# Patient Record
Sex: Male | Born: 1960 | Race: White | Hispanic: No | Marital: Single | Smoking: Former smoker
Health system: Southern US, Community
[De-identification: ages and names within clinical notes are randomized; demographics above are authoritative.]

## PROBLEM LIST (undated history)

## (undated) DIAGNOSIS — E669 Obesity, unspecified: Secondary | ICD-10-CM

## (undated) DIAGNOSIS — F410 Panic disorder [episodic paroxysmal anxiety] without agoraphobia: Secondary | ICD-10-CM

## (undated) DIAGNOSIS — G6181 Chronic inflammatory demyelinating polyneuritis: Secondary | ICD-10-CM

## (undated) DIAGNOSIS — E7800 Pure hypercholesterolemia, unspecified: Secondary | ICD-10-CM

## (undated) DIAGNOSIS — F1121 Opioid dependence, in remission: Secondary | ICD-10-CM

## (undated) DIAGNOSIS — M1990 Unspecified osteoarthritis, unspecified site: Secondary | ICD-10-CM

## (undated) DIAGNOSIS — Z9989 Dependence on other enabling machines and devices: Secondary | ICD-10-CM

## (undated) DIAGNOSIS — K589 Irritable bowel syndrome without diarrhea: Secondary | ICD-10-CM

## (undated) DIAGNOSIS — I2510 Atherosclerotic heart disease of native coronary artery without angina pectoris: Secondary | ICD-10-CM

## (undated) DIAGNOSIS — F32A Depression, unspecified: Secondary | ICD-10-CM

## (undated) DIAGNOSIS — E349 Endocrine disorder, unspecified: Secondary | ICD-10-CM

## (undated) DIAGNOSIS — G629 Polyneuropathy, unspecified: Secondary | ICD-10-CM

## (undated) DIAGNOSIS — G4733 Obstructive sleep apnea (adult) (pediatric): Secondary | ICD-10-CM

## (undated) DIAGNOSIS — I10 Essential (primary) hypertension: Secondary | ICD-10-CM

## (undated) DIAGNOSIS — F1520 Other stimulant dependence, uncomplicated: Secondary | ICD-10-CM

## (undated) DIAGNOSIS — F329 Major depressive disorder, single episode, unspecified: Secondary | ICD-10-CM

## (undated) DIAGNOSIS — R42 Dizziness and giddiness: Secondary | ICD-10-CM

## (undated) HISTORY — PX: CARDIAC CATHETERIZATION: SHX172

---

## 2012-02-08 ENCOUNTER — Encounter (HOSPITAL_BASED_OUTPATIENT_CLINIC_OR_DEPARTMENT_OTHER): Payer: Self-pay | Admitting: *Deleted

## 2012-02-08 ENCOUNTER — Emergency Department (HOSPITAL_BASED_OUTPATIENT_CLINIC_OR_DEPARTMENT_OTHER)
Admission: EM | Admit: 2012-02-08 | Discharge: 2012-02-09 | Disposition: A | Discharge status: Home / Self Care | Payer: Self-pay | Attending: Emergency Medicine | Admitting: Emergency Medicine

## 2012-02-08 DIAGNOSIS — K648 Other hemorrhoids: Secondary | ICD-10-CM | POA: Insufficient documentation

## 2012-02-08 DIAGNOSIS — I10 Essential (primary) hypertension: Secondary | ICD-10-CM | POA: Insufficient documentation

## 2012-02-08 DIAGNOSIS — K644 Residual hemorrhoidal skin tags: Secondary | ICD-10-CM | POA: Insufficient documentation

## 2012-02-08 DIAGNOSIS — E079 Disorder of thyroid, unspecified: Secondary | ICD-10-CM | POA: Insufficient documentation

## 2012-02-08 DIAGNOSIS — E119 Type 2 diabetes mellitus without complications: Secondary | ICD-10-CM | POA: Insufficient documentation

## 2012-02-08 DIAGNOSIS — K649 Unspecified hemorrhoids: Secondary | ICD-10-CM

## 2012-02-08 HISTORY — DX: Essential (primary) hypertension: I10

## 2012-02-08 HISTORY — DX: Opioid dependence, in remission: F11.21

## 2012-02-08 HISTORY — DX: Irritable bowel syndrome, unspecified: K58.9

## 2012-02-08 NOTE — ED Notes (Signed)
Hx of hemorrhoids and fissures. States he just moved here. Rectal bleeding off and on x 8 weeks. Was seen 5 x's in the ED in [place] for same and sent to surgeon who put him on a high fiber diet. He recently moved and was unable to keep in touch with his surgeon. Tonight he felt constipated and then had explosive diarrhea with blood in it.

## 2012-02-09 LAB — BASIC METABOLIC PANEL
BUN: 17 mg/dL (ref 6–23)
CO2: 24 mEq/L (ref 19–32)
Calcium: 9.8 mg/dL (ref 8.4–10.5)
Chloride: 101 mEq/L (ref 96–112)
Creatinine, Ser: 0.8 mg/dL (ref 0.50–1.35)
GFR calc Af Amer: >90 mL/min (ref >90)
GFR calc non Af Amer: >90 mL/min (ref >90)
Glucose, Bld: 177 mg/dL — ABNORMAL HIGH (ref 70–99)
Potassium: 3.9 mEq/L (ref 3.5–5.1)
Sodium: 136 mEq/L (ref 135–145)

## 2012-02-09 LAB — CBC
HCT: 44.6 % (ref 39.0–52.0)
Hemoglobin: 15.9 g/dL (ref 13.0–17.0)
MCH: 29.9 pg (ref 26.0–34.0)
MCHC: 35.7 g/dL (ref 30.0–36.0)
MCV: 83.8 fL (ref 78.0–100.0)
Platelets: 267 10*3/uL (ref 150–400)
RBC: 5.32 MIL/uL (ref 4.22–5.81)
RDW: 12.7 % (ref 11.5–15.5)
WBC: 8.8 10*3/uL (ref 4.0–10.5)

## 2012-02-09 LAB — DIFFERENTIAL
Basophils Absolute: 0 10*3/uL (ref 0.0–0.1)
Basophils Relative: 0 % (ref 0–1)
Eosinophils Absolute: 0.2 10*3/uL (ref 0.0–0.7)
Eosinophils Relative: 2 % (ref 0–5)
Lymphocytes Relative: 25 % (ref 12–46)
Lymphs Abs: 2.2 10*3/uL (ref 0.7–4.0)
Monocytes Absolute: 0.8 10*3/uL (ref 0.1–1.0)
Monocytes Relative: 9 % (ref 3–12)
Neutro Abs: 5.6 10*3/uL (ref 1.7–7.7)
Neutrophils Relative %: 64 % (ref 43–77)

## 2012-02-09 LAB — OCCULT BLOOD X 1 CARD TO LAB, STOOL: Fecal Occult Bld: NEGATIVE

## 2012-02-09 MED ORDER — HYDROCORTISONE ACETATE 25 MG RE SUPP: 25.0000 mg | Freq: Two times a day (BID) | RECTAL | Status: AC

## 2012-02-09 NOTE — ED Provider Notes (Signed)
History     CSN: [id number]  Arrival date & time 02/08/12  2145   First MD Initiated Contact with Patient 02/08/12 2341      Chief Complaint  Patient presents with  . Rectal Bleeding    (Consider location/radiation/quality/duration/timing/severity/associated sxs/prior treatment) Patient is a 51 y.o. male presenting with hematochezia. The history is provided by the patient.  Rectal Bleeding  The current episode started more than 2 weeks ago. The onset was gradual. The problem occurs frequently. The problem has been unchanged. Pain severity now: rectal pain no abdominal pain. The stool is described as hard. Prior successful therapies include fiber. There was no prior unsuccessful therapy. Associated symptoms include rectal pain. Pertinent negatives include no anorexia, no fever, no abdominal pain, no nausea, no vomiting and no chest pain. He has been behaving normally. He has been eating and drinking normally. His past medical history does not include recent abdominal injury. There were no sick contacts. Recently, medical care has been given at another facility. Services received include tests performed.  Has been seen in [place] at least 5 times for same and diagnosed with hemorrhoids also saw a surgeon but is not using the proctosol due to price.  No f/c/r.    Past Medical History  Diagnosis Date  . Hypertension   . Diabetes mellitus   . Thyroid disease   . IBS (irritable bowel syndrome)   . History of narcotic addiction     History reviewed. No pertinent past surgical history.  No family history on file.  History  Substance Use Topics  . Smoking status: Never Smoker   . Smokeless tobacco: Not on file  . Alcohol Use: No      Review of Systems  Constitutional: Negative for fever.  Respiratory: Negative for chest tightness and shortness of breath.   Cardiovascular: Negative for chest pain and leg swelling.  Gastrointestinal: Positive for constipation, hematochezia,  anal bleeding and rectal pain. Negative for nausea, vomiting, abdominal pain and anorexia.  All other systems reviewed and are negative.    Allergies  Review of patient's allergies indicates not on file.  Home Medications  No current outpatient prescriptions on file.  BP 138/93  Pulse 96  Temp 98 F (36.7 C) (Oral)  Resp 20  Ht 5\' 11"  (1.803 m)  Wt 320 lb (145.151 kg)  BMI 44.63 kg/m2  SpO2 100%  Physical Exam  Constitutional: He is oriented to person, place, and time. He appears well-developed and well-nourished. No distress.  HENT:  Head: Normocephalic and atraumatic.  Mouth/Throat: Oropharynx is clear and moist.  Eyes: Conjunctivae are normal. Pupils are equal, round, and reactive to light.  Neck: Normal range of motion. Neck supple.  Cardiovascular: Normal rate and regular rhythm.   Pulmonary/Chest: Effort normal and breath sounds normal. No respiratory distress. He has no wheezes. He has no rales.  Abdominal: Soft. Bowel sounds are normal. He exhibits no distension and no mass. There is no tenderness. There is no rebound and no guarding.  Genitourinary: Guaiac negative stool.       Internal and external hemorrhoid not thrombosed   Musculoskeletal: Normal range of motion.  Lymphadenopathy:    He has no cervical adenopathy.  Neurological: He is alert and oriented to person, place, and time. He has normal reflexes.  Skin: Skin is warm and dry.  Psychiatric: He has a normal mood and affect.    ED Course  Procedures (including critical care time)  Labs Reviewed  BASIC METABOLIC PANEL -  Abnormal; Notable for the following:    Glucose, Bld 177 (*)     All other components within normal limits  CBC  DIFFERENTIAL  OCCULT BLOOD X 1 CARD TO LAB, STOOL   No results found.   No diagnosis found.    MDM  Hemorrhoids.  No abdominal pain.  Exam and labs reassuring.  Was imaged in [facility].  No indication for repeat imaging at this time.  Use proctosol, follow up with GI  return for bleeding.  Patient verbalizes understanding and agrees to follow up        Raeleen Winstanley Smitty Cords, MD 02/09/12 0500

## 2012-02-09 NOTE — ED Notes (Signed)
I took blood samples and gave to lab.

## 2012-02-09 NOTE — Discharge Instructions (Signed)
Hemorrhoids Hemorrhoids are enlarged (dilated) veins around the rectum. There are 2 types of hemorrhoids, and the type of hemorrhoid is determined by its location. Internal hemorrhoids occur in the veins just inside the rectum.They are usually not painful, but they may bleed.However, they may poke through to the outside and become irritated and painful. External hemorrhoids involve the veins outside the anus and can be felt as a painful swelling or hard lump near the anus.They are often itchy and may crack and bleed. Sometimes clots will form in the veins. This makes them swollen and painful. These are called thrombosed hemorrhoids. CAUSES Causes of hemorrhoids include:  Pregnancy. This increases the pressure in the hemorrhoidal veins.   Constipation.   Straining to have a bowel movement.   Obesity.   Heavy lifting or other activity that caused you to strain.  TREATMENT Most of the time hemorrhoids improve in 1 to 2 weeks. However, if symptoms do not seem to be getting better or if you have a lot of rectal bleeding, your caregiver may perform a procedure to help make the hemorrhoids get smaller or remove them completely.Possible treatments include:  Rubber band ligation. A rubber band is placed at the base of the hemorrhoid to cut off the circulation.   Sclerotherapy. A chemical is injected to shrink the hemorrhoid.   Infrared light therapy. Tools are used to burn the hemorrhoid.   Hemorrhoidectomy. This is surgical removal of the hemorrhoid.  HOME CARE INSTRUCTIONS   Increase fiber in your diet. Ask your caregiver about using fiber supplements.   Drink enough water and fluids to keep your urine clear or pale yellow.   Exercise regularly.   Go to the bathroom when you have the urge to have a bowel movement. Do not wait.   Avoid straining to have bowel movements.   Keep the anal area dry and clean.   Only take over-the-counter or prescription medicines for pain, discomfort,  or fever as directed by your caregiver.  If your hemorrhoids are thrombosed:  Take warm sitz baths for 20 to 30 minutes, 3 to 4 times per day.   If the hemorrhoids are very tender and swollen, place ice packs on the area as tolerated. Using ice packs between sitz baths may be helpful. Fill a plastic bag with ice. Place a towel between the bag of ice and your skin.   Medicated creams and suppositories may be used or applied as directed.   Do not use a donut-shaped pillow or sit on the toilet for long periods. This increases blood pooling and pain.  SEEK MEDICAL CARE IF:   You have increasing pain and swelling that is not controlled with your medicine.   You have uncontrolled bleeding.   You have difficulty or you are unable to have a bowel movement.   You have pain or inflammation outside the area of the hemorrhoids.   You have chills or an oral temperature above 102 F (38.9 C).  MAKE SURE YOU: Fiber Content in Foods Drinking plenty of fluids and consuming foods high in fiber can help with constipation. See the list below for the fiber content of some common foods. Starches and Grains / Dietary Fiber (g)  Cheerios, 1 cup / 3 g   Kellogg's Corn Flakes, 1 cup / 0.7 g   Rice Krispies, 1  cup / 0.3 g   Quaker Oat Life Cereal,  cup / 2.1 g   Oatmeal, instant (cooked),  cup / 2 g   Kellogg's  Frosted Mini Wheats, 1 cup / 5.1 g   Rice, brown, long-grain (cooked), 1 cup / 3.5 g   Rice, white, long-grain (cooked), 1 cup / 0.6 g   Macaroni, cooked, enriched, 1 cup / 2.5 g  Legumes / Dietary Fiber (g)  Beans, baked, canned, plain or vegetarian,  cup / 5.2 g   Beans, kidney, canned,  cup / 6.8 g   Beans, pinto, dried (cooked),  cup / 7.7 g   Beans, pinto, canned,  cup / 5.5 g  Breads and Crackers / Dietary Fiber (g)  Graham crackers, plain or honey, 2 squares / 0.7 g   Saltine crackers, 3 squares / 0.3 g   Pretzels, plain, salted, 10 pieces / 1.8 g   Bread,  whole-wheat, 1 slice / 1.9 g   Bread, white, 1 slice / 0.7 g   Bread, raisin, 1 slice / 1.2 g   Bagel, plain, 3 oz / 2 g   Tortilla, flour, 1 oz / 0.9 g   Tortilla, corn, 1 small / 1.5 g   Bun, hamburger or hotdog, 1 small / 0.9 g  Fruits / Dietary Fiber (g)  Apple, raw with skin, 1 medium / 4.4 g   Applesauce, sweetened,  cup / 1.5 g   Banana,  medium / 1.5 g   Grapes, 10 grapes / 0.4 g   Orange, 1 small / 2.3 g   Raisin, 1.5 oz / 1.6 g   Melon, 1 cup / 1.4 g  Vegetables / Dietary Fiber (g)  Green beans, canned,  cup / 1.3 g   Carrots (cooked),  cup / 2.3 g   Broccoli (cooked),  cup / 2.8 g   Peas, frozen (cooked),  cup / 4.4 g   Potatoes, mashed,  cup / 1.6 g   Lettuce, 1 cup / 0.5 g   Corn, canned,  cup / 1.6 g   Tomato,  cup / 1.1 g  Document Released: 12/23/2006 Document Revised: 07/26/2011 Document Reviewed: 02/17/2007 [facility] Patient Information 2012 [place], [place].  Understand these instructions.   Will watch your condition.   Will get help right away if you are not doing well or get worse.  Document Released: 08/03/2000 Document Revised: 07/26/2011 Document Reviewed: 12/09/2007 [facility] Patient Information 2012 [place], [place].

## 2012-03-30 ENCOUNTER — Observation Stay (HOSPITAL_BASED_OUTPATIENT_CLINIC_OR_DEPARTMENT_OTHER)
Admission: EM | Admit: 2012-03-30 | Discharge: 2012-04-01 | Disposition: A | Discharge status: Home / Self Care | Payer: MEDICAID | Attending: Internal Medicine | Admitting: Internal Medicine

## 2012-03-30 ENCOUNTER — Emergency Department (HOSPITAL_BASED_OUTPATIENT_CLINIC_OR_DEPARTMENT_OTHER): Payer: Self-pay

## 2012-03-30 ENCOUNTER — Encounter (HOSPITAL_BASED_OUTPATIENT_CLINIC_OR_DEPARTMENT_OTHER): Payer: Self-pay | Admitting: Emergency Medicine

## 2012-03-30 DIAGNOSIS — E1142 Type 2 diabetes mellitus with diabetic polyneuropathy: Secondary | ICD-10-CM

## 2012-03-30 DIAGNOSIS — E1149 Type 2 diabetes mellitus with other diabetic neurological complication: Secondary | ICD-10-CM

## 2012-03-30 DIAGNOSIS — F3289 Other specified depressive episodes: Secondary | ICD-10-CM | POA: Insufficient documentation

## 2012-03-30 DIAGNOSIS — IMO0002 Reserved for concepts with insufficient information to code with codable children: Secondary | ICD-10-CM | POA: Diagnosis present

## 2012-03-30 DIAGNOSIS — I10 Essential (primary) hypertension: Secondary | ICD-10-CM

## 2012-03-30 DIAGNOSIS — E669 Obesity, unspecified: Secondary | ICD-10-CM | POA: Insufficient documentation

## 2012-03-30 DIAGNOSIS — Z8249 Family history of ischemic heart disease and other diseases of the circulatory system: Secondary | ICD-10-CM

## 2012-03-30 DIAGNOSIS — F419 Anxiety disorder, unspecified: Secondary | ICD-10-CM

## 2012-03-30 DIAGNOSIS — F411 Generalized anxiety disorder: Secondary | ICD-10-CM | POA: Insufficient documentation

## 2012-03-30 DIAGNOSIS — R079 Chest pain, unspecified: Principal | ICD-10-CM

## 2012-03-30 DIAGNOSIS — F1920 Other psychoactive substance dependence, uncomplicated: Secondary | ICD-10-CM | POA: Insufficient documentation

## 2012-03-30 DIAGNOSIS — R45851 Suicidal ideations: Secondary | ICD-10-CM | POA: Diagnosis present

## 2012-03-30 DIAGNOSIS — F32A Depression, unspecified: Secondary | ICD-10-CM | POA: Diagnosis present

## 2012-03-30 DIAGNOSIS — F329 Major depressive disorder, single episode, unspecified: Secondary | ICD-10-CM | POA: Insufficient documentation

## 2012-03-30 DIAGNOSIS — E7800 Pure hypercholesterolemia, unspecified: Secondary | ICD-10-CM | POA: Insufficient documentation

## 2012-03-30 DIAGNOSIS — R0602 Shortness of breath: Secondary | ICD-10-CM | POA: Insufficient documentation

## 2012-03-30 DIAGNOSIS — G4730 Sleep apnea, unspecified: Secondary | ICD-10-CM

## 2012-03-30 DIAGNOSIS — F418 Other specified anxiety disorders: Secondary | ICD-10-CM | POA: Diagnosis present

## 2012-03-30 DIAGNOSIS — E1140 Type 2 diabetes mellitus with diabetic neuropathy, unspecified: Secondary | ICD-10-CM | POA: Diagnosis present

## 2012-03-30 DIAGNOSIS — R072 Precordial pain: Secondary | ICD-10-CM | POA: Diagnosis present

## 2012-03-30 DIAGNOSIS — I200 Unstable angina: Secondary | ICD-10-CM | POA: Diagnosis present

## 2012-03-30 DIAGNOSIS — E785 Hyperlipidemia, unspecified: Secondary | ICD-10-CM

## 2012-03-30 DIAGNOSIS — E1165 Type 2 diabetes mellitus with hyperglycemia: Secondary | ICD-10-CM | POA: Diagnosis present

## 2012-03-30 DIAGNOSIS — I2510 Atherosclerotic heart disease of native coronary artery without angina pectoris: Secondary | ICD-10-CM

## 2012-03-30 DIAGNOSIS — R109 Unspecified abdominal pain: Secondary | ICD-10-CM | POA: Diagnosis present

## 2012-03-30 HISTORY — DX: Atherosclerotic heart disease of native coronary artery without angina pectoris: I25.10

## 2012-03-30 HISTORY — DX: Pure hypercholesterolemia, unspecified: E78.00

## 2012-03-30 HISTORY — DX: Obesity, unspecified: E66.9

## 2012-03-30 HISTORY — DX: Endocrine disorder, unspecified: E34.9

## 2012-03-30 HISTORY — DX: Depression, unspecified: F32.A

## 2012-03-30 HISTORY — DX: Major depressive disorder, single episode, unspecified: F32.9

## 2012-03-30 LAB — CBC
HCT: 41.4 % (ref 39.0–52.0)
HCT: 42.4 % (ref 39.0–52.0)
Hemoglobin: 14.6 g/dL (ref 13.0–17.0)
Hemoglobin: 14.7 g/dL (ref 13.0–17.0)
MCH: 30.2 pg (ref 26.0–34.0)
MCH: 30.1 pg (ref 26.0–34.0)
MCHC: 35.3 g/dL (ref 30.0–36.0)
MCHC: 34.7 g/dL (ref 30.0–36.0)
MCV: 85.7 fL (ref 78.0–100.0)
MCV: 86.9 fL (ref 78.0–100.0)
Platelets: 264 10*3/uL (ref 150–400)
Platelets: 199 10*3/uL (ref 150–400)
RBC: 4.83 MIL/uL (ref 4.22–5.81)
RBC: 4.88 MIL/uL (ref 4.22–5.81)
RDW: 12.9 % (ref 11.5–15.5)
RDW: 12.9 % (ref 11.5–15.5)
WBC: 7.3 10*3/uL (ref 4.0–10.5)
WBC: 7.8 10*3/uL (ref 4.0–10.5)

## 2012-03-30 LAB — MRSA PCR SCREENING: MRSA by PCR: NEGATIVE

## 2012-03-30 LAB — BASIC METABOLIC PANEL
BUN: 12 mg/dL (ref 6–23)
CO2: 23 mEq/L (ref 19–32)
Calcium: 9.4 mg/dL (ref 8.4–10.5)
Chloride: 99 mEq/L (ref 96–112)
Creatinine, Ser: 0.7 mg/dL (ref 0.50–1.35)
GFR calc Af Amer: >90 mL/min (ref >90)
GFR calc non Af Amer: >90 mL/min (ref >90)
Glucose, Bld: 304 mg/dL — ABNORMAL HIGH (ref 70–99)
Potassium: 3.9 mEq/L (ref 3.5–5.1)
Sodium: 135 mEq/L (ref 135–145)

## 2012-03-30 LAB — TROPONIN I: Troponin I: <0.3 ng/mL (ref <0.30)

## 2012-03-30 LAB — GLUCOSE, CAPILLARY
Glucose-Capillary: 159 mg/dL — ABNORMAL HIGH (ref 70–99)
Glucose-Capillary: 221 mg/dL — ABNORMAL HIGH (ref 70–99)

## 2012-03-30 LAB — CARDIAC PANEL(CRET KIN+CKTOT+MB+TROPI)
CK, MB: 1.3 ng/mL (ref 0.3–4.0)
Relative Index: INVALID (ref 0.0–2.5)
Total CK: 46 U/L (ref 7–232)
Troponin I: <0.3 ng/mL (ref <0.30)

## 2012-03-30 LAB — CREATININE, SERUM
Creatinine, Ser: 0.61 mg/dL (ref 0.50–1.35)
GFR calc Af Amer: >90 mL/min (ref >90)
GFR calc non Af Amer: >90 mL/min (ref >90)

## 2012-03-30 IMAGING — CR DG CHEST 2V
2 series · 2 of 2 positions shown · non-contrast
Comparison: None.

CLINICAL DATA: Intermittent left-sided chest pain since [DATE].  Shortness of breath.  Headaches.

CHEST - 2 VIEW

[w chest pa]
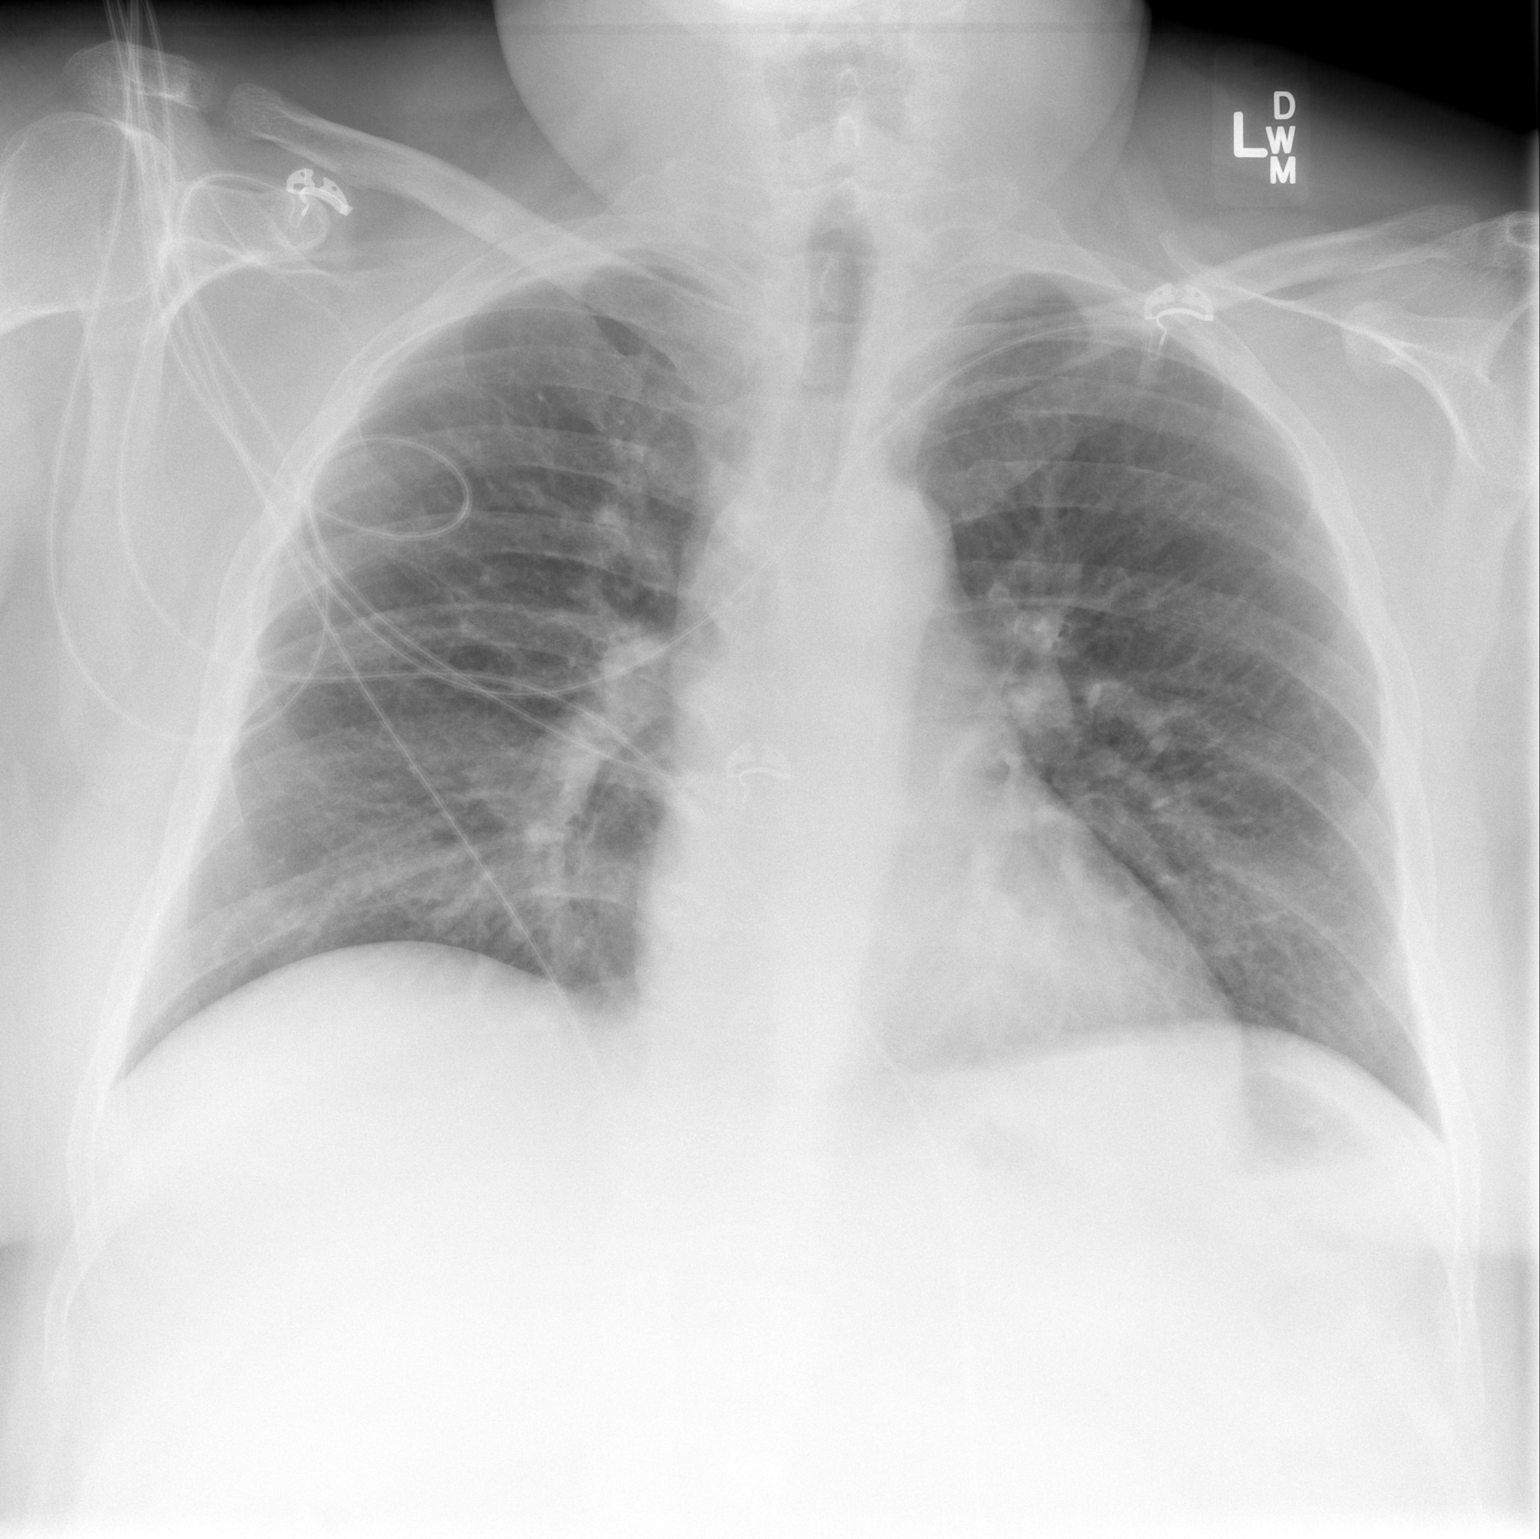

[w chest lat]
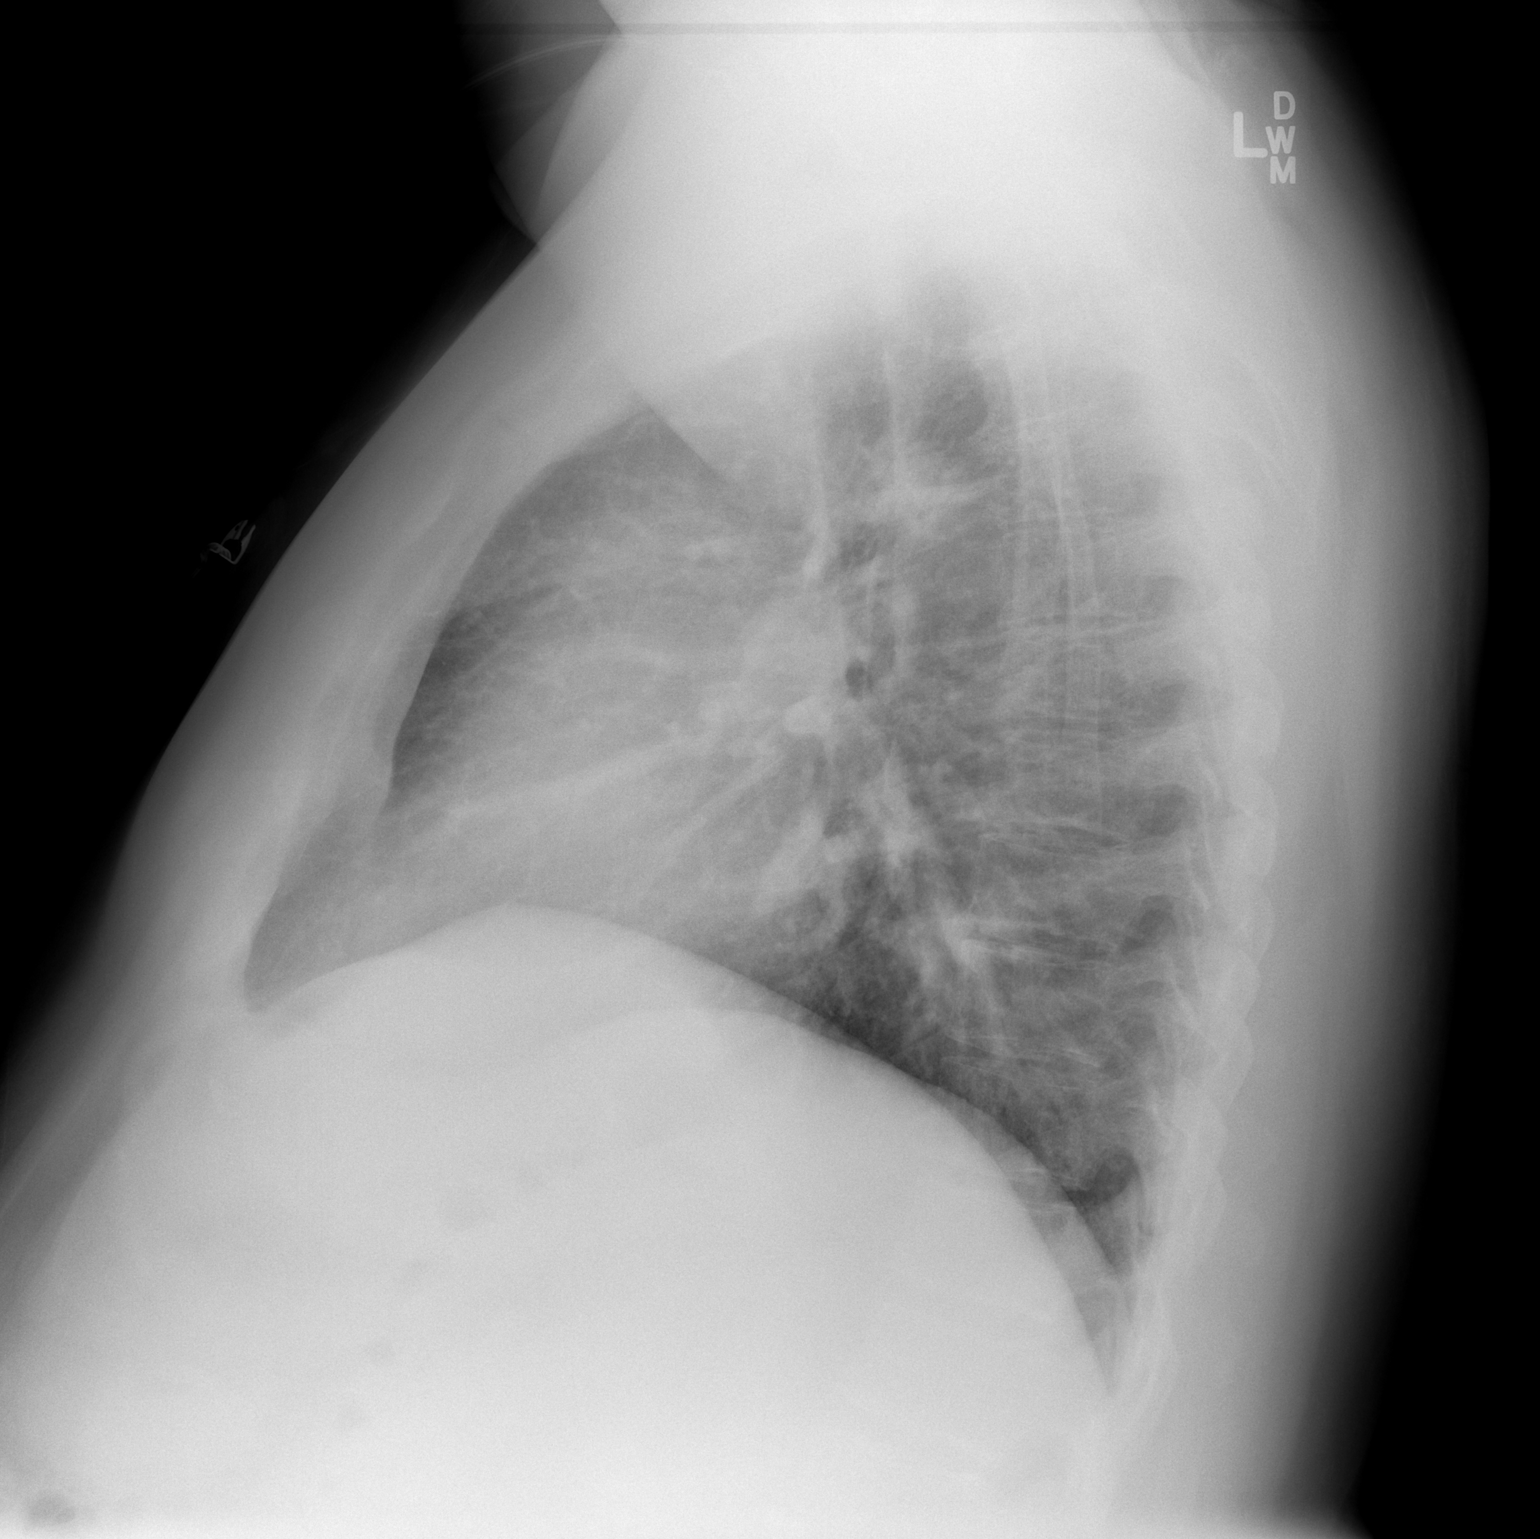

[2 of 2 positions shown; findings below may reference images not displayed]

FINDINGS: Suboptimal inspiration due to body habitus which accounts
for crowded bronchovascular markings at the bases and accentuates
the cardiac silhouette.  Taking this into account,
cardiomediastinal silhouette unremarkable and lungs clear.  No
pleural effusions.  Visualized bony thorax intact.
IMPRESSION: Suboptimal inspiration.  No acute cardiopulmonary disease.

## 2012-03-30 MED ORDER — ACETAMINOPHEN 325 MG PO TABS: 650.0000 mg | ORAL_TABLET | Freq: Four times a day (QID) | ORAL | Status: DC | PRN

## 2012-03-30 MED ORDER — METFORMIN HCL 500 MG PO TABS
1000.0000 mg | ORAL_TABLET | Freq: Two times a day (BID) | ORAL | Status: DC
  Filled 2012-03-30 (×2): qty 2

## 2012-03-30 MED ORDER — SODIUM CHLORIDE 0.9 % IV SOLN
Freq: Once | INTRAVENOUS | Status: AC
  Administered 2012-03-30: 1000 mL via INTRAVENOUS

## 2012-03-30 MED ORDER — PNEUMOCOCCAL VAC POLYVALENT 25 MCG/0.5ML IJ INJ
0.5000 mL | INJECTION | INTRAMUSCULAR | Status: AC
  Filled 2012-03-30: qty 0.5

## 2012-03-30 MED ORDER — ASPIRIN 81 MG PO CHEW
324.0000 mg | CHEWABLE_TABLET | Freq: Once | ORAL | Status: AC
  Administered 2012-03-30: 324 mg via ORAL

## 2012-03-30 MED ORDER — LISINOPRIL 10 MG PO TABS
10.0000 mg | ORAL_TABLET | Freq: Every day | ORAL | Status: DC
  Administered 2012-03-31 – 2012-04-01 (×2): 10 mg via ORAL
  Filled 2012-03-30 (×2): qty 1

## 2012-03-30 MED ORDER — ONDANSETRON HCL 4 MG PO TABS: 4.0000 mg | ORAL_TABLET | Freq: Four times a day (QID) | ORAL | Status: DC | PRN

## 2012-03-30 MED ORDER — INSULIN ASPART 100 UNIT/ML ~~LOC~~ SOLN
0.0000 [IU] | Freq: Every day | SUBCUTANEOUS | Status: DC
  Administered 2012-03-30: 2 [IU] via SUBCUTANEOUS

## 2012-03-30 MED ORDER — SODIUM CHLORIDE 0.9 % IV SOLN: 250.0000 mL | INTRAVENOUS | Status: DC | PRN

## 2012-03-30 MED ORDER — ALUM & MAG HYDROXIDE-SIMETH 200-200-20 MG/5ML PO SUSP: 30.0000 mL | Freq: Four times a day (QID) | ORAL | Status: DC | PRN

## 2012-03-30 MED ORDER — SODIUM CHLORIDE 0.9 % IJ SOLN
3.0000 mL | Freq: Two times a day (BID) | INTRAMUSCULAR | Status: DC
  Administered 2012-03-30: 3 mL via INTRAVENOUS

## 2012-03-30 MED ORDER — NITROGLYCERIN 0.4 MG SL SUBL
0.4000 mg | SUBLINGUAL_TABLET | SUBLINGUAL | Status: DC | PRN
  Administered 2012-03-30 (×3): 0.4 mg via SUBLINGUAL
  Filled 2012-03-30: qty 25

## 2012-03-30 MED ORDER — INSULIN ASPART 100 UNIT/ML ~~LOC~~ SOLN
0.0000 [IU] | Freq: Three times a day (TID) | SUBCUTANEOUS | Status: DC
  Administered 2012-03-30: 3 [IU] via SUBCUTANEOUS

## 2012-03-30 MED ORDER — AMLODIPINE BESYLATE 5 MG PO TABS
5.0000 mg | ORAL_TABLET | Freq: Every day | ORAL | Status: DC
  Administered 2012-03-31 – 2012-04-01 (×2): 5 mg via ORAL
  Filled 2012-03-30 (×2): qty 1

## 2012-03-30 MED ORDER — SIMVASTATIN 5 MG PO TABS
5.0000 mg | ORAL_TABLET | Freq: Every day | ORAL | Status: DC
  Administered 2012-04-01: 10:00:00 5 mg via ORAL
  Filled 2012-03-30 (×2): qty 1

## 2012-03-30 MED ORDER — ASPIRIN 325 MG PO TABS: 325.0000 mg | ORAL_TABLET | ORAL | Status: DC

## 2012-03-30 MED ORDER — ENOXAPARIN SODIUM 80 MG/0.8ML ~~LOC~~ SOLN
70.0000 mg | SUBCUTANEOUS | Status: DC
  Administered 2012-03-30: 70 mg via SUBCUTANEOUS
  Filled 2012-03-30 (×2): qty 0.8

## 2012-03-30 MED ORDER — SITAGLIPTIN PHOS-METFORMIN HCL 50-1000 MG PO TABS: 1.0000 | ORAL_TABLET | Freq: Two times a day (BID) | ORAL | Status: DC

## 2012-03-30 MED ORDER — ASPIRIN 81 MG PO CHEW
CHEWABLE_TABLET | ORAL | Status: AC
  Administered 2012-03-30: 324 mg via ORAL
  Filled 2012-03-30: qty 4

## 2012-03-30 MED ORDER — ONDANSETRON HCL 4 MG/2ML IJ SOLN: 4.0000 mg | Freq: Four times a day (QID) | INTRAMUSCULAR | Status: DC | PRN

## 2012-03-30 MED ORDER — LINAGLIPTIN 5 MG PO TABS
5.0000 mg | ORAL_TABLET | Freq: Every day | ORAL | Status: DC
  Administered 2012-04-01: 10:00:00 5 mg via ORAL
  Filled 2012-03-30 (×2): qty 1

## 2012-03-30 MED ORDER — PSYLLIUM 95 % PO PACK
1.0000 | PACK | Freq: Every day | ORAL | Status: DC
  Administered 2012-03-30 – 2012-03-31 (×2): 1 via ORAL
  Filled 2012-03-30 (×3): qty 1

## 2012-03-30 MED ORDER — LORAZEPAM 2 MG/ML IJ SOLN
0.5000 mg | INTRAMUSCULAR | Status: DC | PRN
  Administered 2012-03-30 (×2): 0.5 mg via INTRAVENOUS
  Filled 2012-03-30 (×2): qty 1

## 2012-03-30 MED ORDER — SODIUM CHLORIDE 0.9 % IJ SOLN: 3.0000 mL | INTRAMUSCULAR | Status: DC | PRN

## 2012-03-30 MED ORDER — ACETAMINOPHEN 650 MG RE SUPP: 650.0000 mg | Freq: Four times a day (QID) | RECTAL | Status: DC | PRN

## 2012-03-30 NOTE — Progress Notes (Signed)
Call received from [place] Sheldon, ER MD at [facility].  Unassigned pt (recently moved here from [facility]) comes in with CP.  Partially relieved with one dose of nitro.  Vitals, lab and EKG ok.  Risk factors include: HTN, age, fam hx, DM & lipids.

## 2012-03-30 NOTE — ED Provider Notes (Signed)
History     CSN: [id number]  Arrival date & time 03/30/12  1143   First MD Initiated Contact with Patient 03/30/12 1201      Chief Complaint  Patient presents with  . Chest Pain    (Consider location/radiation/quality/duration/timing/severity/associated sxs/prior treatment) HPI Pt with multiple medical problems reports he had some chest pain last night radiating into L arm and shoulder which was worse this morning. Associated with SOB, diaphoresis. Improved some before arrival from 10/10 to 6/10 at present. He had a similar presentation to [facility] in March, had an overnight admission for rule out but did not followup for further eval/stress test.   Past Medical History  Diagnosis Date  . Hypertension   . Diabetes mellitus   . IBS (irritable bowel syndrome)   . History of narcotic addiction   . Hypercholesteremia   . Testosterone deficiency     History reviewed. No pertinent past surgical history.  History reviewed. No pertinent family history.  History  Substance Use Topics  . Smoking status: Never Smoker   . Smokeless tobacco: Not on file  . Alcohol Use: No      Review of Systems All other systems reviewed and are negative except as noted in HPI.   Allergies  Review of patient's allergies indicates no known allergies.  Home Medications   Current Outpatient Rx  Name Route Sig Dispense Refill  . AMLODIPINE BESYLATE 5 MG PO TABS Oral Take 5 mg by mouth daily.    [name] Kitchen LISINOPRIL 10 MG PO TABS Oral Take 10 mg by mouth daily.    [name] Kitchen PRAVASTATIN SODIUM 10 MG PO TABS Oral Take 10 mg by mouth daily.    [name] Kitchen SITAGLIPTIN-METFORMIN HCL 50-1000 MG PO TABS Oral Take 1 tablet by mouth 2 (two) times daily with a meal.      BP 108/56  Pulse 67  Temp 98.5 F (36.9 C) (Oral)  Resp 23  Ht 5\' 11"  (1.803 m)  Wt 320 lb (145.151 kg)  BMI 44.63 kg/m2  SpO2 97%  Physical Exam  Nursing note and vitals reviewed. Constitutional: He is oriented to person, place,  and time. He appears well-developed and well-nourished.  HENT:  Head: Normocephalic and atraumatic.  Eyes: EOM are normal. Pupils are equal, round, and reactive to light.  Neck: Normal range of motion. Neck supple.  Cardiovascular: Normal rate, normal heart sounds and intact distal pulses.   Pulmonary/Chest: Effort normal and breath sounds normal.  Abdominal: Bowel sounds are normal. He exhibits no distension. There is no tenderness.  Musculoskeletal: Normal range of motion. He exhibits no edema and no tenderness.  Neurological: He is alert and oriented to person, place, and time. He has normal strength. No cranial nerve deficit or sensory deficit.  Skin: Skin is warm and dry. No rash noted.  Psychiatric: He has a normal mood and affect.    ED Course  Procedures (including critical care time)  Labs Reviewed  BASIC METABOLIC PANEL - Abnormal; Notable for the following:    Glucose, Bld 304 (*)     All other components within normal limits  CBC  TROPONIN I   Dg Chest 2 View  03/30/2012  *RADIOLOGY REPORT*  Clinical Data: Intermittent left-sided chest pain since March, 2013.  Shortness of breath.  Headaches.  CHEST - 2 VIEW  Comparison: None.  Findings: Suboptimal inspiration due to body habitus which accounts for crowded bronchovascular markings at the bases and accentuates the cardiac silhouette.  Taking this into account, cardiomediastinal  silhouette unremarkable and lungs clear.  No pleural effusions.  Visualized bony thorax intact.  IMPRESSION: Suboptimal inspiration.  No acute cardiopulmonary disease.  Original Report Authenticated By: Arnell Sieving, M.D.     No diagnosis found.    MDM   Date: 03/30/2012  Rate: 83  Rhythm: normal sinus rhythm  QRS Axis: normal  Intervals: normal  ST/T Wave abnormalities: normal  Conduction Disutrbances: none  Narrative Interpretation: unremarkable   Labs and imaging unremarkable. Pain partially resolved with NTG. Will admit for rule  out and further evaluation at [facility]. Dr. Chancy Milroy accepting.         Colinda Barth B. Bernette Mayers, MD 03/30/12 1334

## 2012-03-30 NOTE — ED Notes (Signed)
Chest pain started last night while in bed.  Radiated to left shoulder, jaw and left arm.  Some sob, clammy and lightheaded.  Worse this am but better now.

## 2012-03-30 NOTE — ED Notes (Signed)
Patient states the pressure in his chest is improved, but states that his left arm continues to be numb. Patient texting and taking pictures with cellphone equally with both arms.

## 2012-03-30 NOTE — H&P (Signed)
[facility] History and Physical  Zedric Deroy ZOX:[id number] DOB: 02-01-1961 DOA: 03/30/2012  Referring physician: [place] Sheldon, ER physician PCP: Provider Not In System , patient recently moved from [facility] and has not establish with a primary care doctor here in town  Chief Complaint: Chest pain  HPI:  Patient is a 51 year old white male with past medical history hypertension, poorly controlled diabetes mellitus and hyperlipidemia who presents with several months of intermittent chest discomfort. In the last 24 hours however, it is min more unrelenting in nature. Initially it was described as a combination of heaviness and sharp pain low starting midsternal and radiating up to the left shoulder he also reports some left arm numbness as well as some pain and numbness going up the left side of his neck and face. He reported no associated shortness of breath, but the pain was so severe he found himself staggering in the shower today. He came into the emergency room and by the time he arrived in the emergency room, he felt like as if someone was standing on his chest. Initial EKG and cardiac markers were unremarkable. All lab work was unremarkable as well. Given his multiple risk factors, it was felt best that he come in to the hospital to be observed overnight. [facility]. Patient received several doses of nitroglycerin which took his pain down to about a 3-2/10. Because he was still having pain persisting, he was sent over to the step down unit.  Review of Systems:  When I saw the patient on the floor, was doing okay. He describes his chest discomfort as mild pressure about a 1 or 2/10. He denied any headaches, vision changes, dysphasia, palpitations, shortness of breath, wheeze, cough, abdominal pain, hematuria, dysuria, constipation, diarrhea, focal extremity weakness or pain. He still is reporting some mild left arm numbness.  His review of systems otherwise negative. 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 Hypertension   . Diabetes mellitus   . IBS (irritable bowel syndrome)   . History of narcotic addiction   . Hypercholesteremia   . Testosterone deficiency   . Depression    History reviewed. No pertinent past surgical history. Social History:  reports that he has never smoked. He does not have any smokeless tobacco history on file. He reports that he does not drink alcohol or use illicit drugs. Patient currently lives at home with his mom, who takes care of. He recently moved up here from [place]. He is able to participate in all activities of daily living without difficulty.  No Known Allergies  Family history: Dad and brother with heart attack  Prior to Admission medications   Medication Sig Start Date End Date Taking? Authorizing Provider  amLODipine (NORVASC) 5 MG tablet Take 5 mg by mouth daily.   Yes Historical Provider, MD  lisinopril (PRINIVIL,ZESTRIL) 10 MG tablet Take 10 mg by mouth daily.   Yes Historical Provider, MD  pravastatin (PRAVACHOL) 10 MG tablet Take 10 mg by mouth daily.   Yes Historical Provider, MD  sitaGLIPtan-metformin (JANUMET) 50-1000 MG per tablet Take 1 tablet by mouth 2 (two) times daily with a meal.   Yes Historical Provider, MD   Physical Exam: Filed Vitals:   03/30/12 1600 03/30/12 1602 03/30/12 1615 03/30/12 1630  BP: 102/52  121/61   Pulse: 65  64 66  Temp:  97.9 F (36.6 C)  TempSrc:  Oral    Resp: 14  10 11   Height:  5\' 11"  (1.803 m)    Weight:  143.6 kg (316 lb 9.3 oz)    SpO2: 99%  99% 100%     General:  Alert and oriented x3, no acute distress, fatigue, looks older than stated age  Eyes: Sclera nonicteric, extraocular muscles are intact  ENT: Normocephalic and atraumatic, mucous members are moist  Neck: Supple, thick airway, no JVD  Cardiovascular: Regular rate and  rhythm, S1-S2  Respiratory: Clear to auscultation bilaterally  Abdomen: Soft, obese, nontender, positive bowel sounds  Skin: The skin tears breaks or lesions  Musculoskeletal: No clubbing or cyanosis or edema  Psychiatric: Patient is appropriate, no signs of acute psychoses  Neurologic: No focal deficits  Labs on Admission:  Basic Metabolic Panel:  Lab 03/30/12 1610  NA 135  K 3.9  CL 99  CO2 23  GLUCOSE 304*  BUN 12  CREATININE 0.70  CALCIUM 9.4  MG --  PHOS --   CBC:  Lab 03/30/12 1210  WBC 7.3  NEUTROABS --  HGB 14.6  HCT 41.4  MCV 85.7  PLT 264   Cardiac Enzymes:  Lab 03/30/12 1210  CKTOTAL --  CKMB --  CKMBINDEX --  TROPONINI <0.30     Radiological Exams on Admission: Dg Chest 2 View 03/30/2012   IMPRESSION: Suboptimal inspiration.  No acute cardiopulmonary disease.  Original Report Authenticated By: Arnell Sieving, M.D.    EKG: Independently reviewed. Normal sinus rhythm  Assessment/Plan Principal Problem:  *Precordial pain: Cycle enzymes x3. EKG looks okay. Because of multiple risk factors, will ask cardiology to see to evaluate for stress test.  Active Problems:  Hyperlipemia: Check lipid panel. Patient is on a statin.   HTN (hypertension), benign: Continue home meds. Monitoring.   Obesity   DM (diabetes mellitus), type 2, uncontrolled: Check A1c, sliding scale plus home meds   Anxiety disorder: When necessary Ativan   Code Status: Full code Family Communication: Discussed with patient at bedside Disposition Plan: Observe overnight, likely discharge home tomorrow  Time spent: 40 minutes  Hollice Espy [facility] Pager [phone]  If 7PM-7AM, please contact night-coverage [email or web address] Password [facility] 03/30/2012, 4:50 PM

## 2012-03-31 ENCOUNTER — Encounter (HOSPITAL_COMMUNITY): Payer: Self-pay | Admitting: Cardiology

## 2012-03-31 ENCOUNTER — Encounter (HOSPITAL_COMMUNITY)
Admission: EM | Disposition: A | Discharge status: Home / Self Care | Payer: Self-pay | Source: Home / Self Care | Attending: Internal Medicine

## 2012-03-31 DIAGNOSIS — Z8249 Family history of ischemic heart disease and other diseases of the circulatory system: Secondary | ICD-10-CM

## 2012-03-31 DIAGNOSIS — I200 Unstable angina: Secondary | ICD-10-CM | POA: Diagnosis present

## 2012-03-31 DIAGNOSIS — G4730 Sleep apnea, unspecified: Secondary | ICD-10-CM | POA: Diagnosis present

## 2012-03-31 HISTORY — PX: LEFT HEART CATHETERIZATION WITH CORONARY ANGIOGRAM: SHX5451

## 2012-03-31 LAB — HEMOGLOBIN A1C
Hgb A1c MFr Bld: 8.1 % — ABNORMAL HIGH (ref <5.7)
Mean Plasma Glucose: 186 mg/dL — ABNORMAL HIGH (ref <117)

## 2012-03-31 LAB — GLUCOSE, CAPILLARY
Glucose-Capillary: 162 mg/dL — ABNORMAL HIGH (ref 70–99)
Glucose-Capillary: 174 mg/dL — ABNORMAL HIGH (ref 70–99)
Glucose-Capillary: 135 mg/dL — ABNORMAL HIGH (ref 70–99)
Glucose-Capillary: 167 mg/dL — ABNORMAL HIGH (ref 70–99)

## 2012-03-31 LAB — CARDIAC PANEL(CRET KIN+CKTOT+MB+TROPI)
CK, MB: 1.2 ng/mL (ref 0.3–4.0)
CK, MB: 1.1 ng/mL (ref 0.3–4.0)
Relative Index: INVALID (ref 0.0–2.5)
Relative Index: INVALID (ref 0.0–2.5)
Total CK: 40 U/L (ref 7–232)
Total CK: 31 U/L (ref 7–232)
Troponin I: <0.3 ng/mL (ref <0.30)
Troponin I: <0.3 ng/mL (ref <0.30)

## 2012-03-31 LAB — PROTIME-INR
INR: 1.14 (ref 0.00–1.49)
Prothrombin Time: 14.8 seconds (ref 11.6–15.2)

## 2012-03-31 SURGERY — LEFT HEART CATHETERIZATION WITH CORONARY ANGIOGRAM
Anesthesia: LOCAL

## 2012-03-31 MED ORDER — ASPIRIN 81 MG PO CHEW
CHEWABLE_TABLET | ORAL | Status: AC
  Administered 2012-03-31: 324 mg
  Filled 2012-03-31: qty 4

## 2012-03-31 MED ORDER — INSULIN GLARGINE 100 UNIT/ML ~~LOC~~ SOLN
10.0000 [IU] | Freq: Every day | SUBCUTANEOUS | Status: DC
  Administered 2012-03-31: 10 [IU] via SUBCUTANEOUS

## 2012-03-31 MED ORDER — INSULIN GLARGINE 100 UNIT/ML ~~LOC~~ SOLN: 10.0000 [IU] | Freq: Every day | SUBCUTANEOUS | Status: DC

## 2012-03-31 MED ORDER — SODIUM CHLORIDE 0.9 % IJ SOLN: 3.0000 mL | INTRAMUSCULAR | Status: DC | PRN

## 2012-03-31 MED ORDER — ACETAMINOPHEN 325 MG PO TABS: 650.0000 mg | ORAL_TABLET | ORAL | Status: DC | PRN

## 2012-03-31 MED ORDER — MORPHINE SULFATE 2 MG/ML IJ SOLN: 2.0000 mg | INTRAMUSCULAR | Status: DC | PRN

## 2012-03-31 MED ORDER — MIDAZOLAM HCL 2 MG/2ML IJ SOLN
INTRAMUSCULAR | Status: AC
  Filled 2012-03-31: qty 2

## 2012-03-31 MED ORDER — LIDOCAINE HCL (PF) 1 % IJ SOLN
INTRAMUSCULAR | Status: AC
  Filled 2012-03-31: qty 30

## 2012-03-31 MED ORDER — LORAZEPAM 0.5 MG PO TABS
1.0000 mg | ORAL_TABLET | Freq: Three times a day (TID) | ORAL | Status: DC | PRN
  Administered 2012-03-31 – 2012-04-01 (×2): 1 mg via ORAL
  Filled 2012-03-31 (×2): qty 2

## 2012-03-31 MED ORDER — ZOLPIDEM TARTRATE 5 MG PO TABS
10.0000 mg | ORAL_TABLET | Freq: Once | ORAL | Status: AC
  Administered 2012-03-31: 10 mg via ORAL
  Filled 2012-03-31: qty 2

## 2012-03-31 MED ORDER — NITROGLYCERIN 0.2 MG/ML ON CALL CATH LAB
INTRAVENOUS | Status: AC
  Filled 2012-03-31: qty 1

## 2012-03-31 MED ORDER — ENOXAPARIN SODIUM 80 MG/0.8ML ~~LOC~~ SOLN
70.0000 mg | SUBCUTANEOUS | Status: DC
  Filled 2012-03-31: qty 0.8

## 2012-03-31 MED ORDER — FENTANYL CITRATE 0.05 MG/ML IJ SOLN
INTRAMUSCULAR | Status: AC
  Filled 2012-03-31: qty 2

## 2012-03-31 MED ORDER — VERAPAMIL HCL 2.5 MG/ML IV SOLN
INTRAVENOUS | Status: AC
  Filled 2012-03-31: qty 2

## 2012-03-31 MED ORDER — SODIUM CHLORIDE 0.9 % IV SOLN
INTRAVENOUS | Status: DC
  Administered 2012-03-31: 10:00:00 via INTRAVENOUS

## 2012-03-31 MED ORDER — HEPARIN (PORCINE) IN NACL 2-0.9 UNIT/ML-% IJ SOLN
INTRAMUSCULAR | Status: AC
  Filled 2012-03-31: qty 2000

## 2012-03-31 MED ORDER — DIAZEPAM 5 MG PO TABS
5.0000 mg | ORAL_TABLET | ORAL | Status: AC
  Administered 2012-03-31: 5 mg via ORAL
  Filled 2012-03-31: qty 1

## 2012-03-31 MED ORDER — SODIUM CHLORIDE 0.9 % IV SOLN
1.0000 mL/kg/h | INTRAVENOUS | Status: DC
  Administered 2012-03-31: 17:00:00 1 mL/kg/h via INTRAVENOUS

## 2012-03-31 MED ORDER — ASPIRIN 81 MG PO CHEW: 324.0000 mg | CHEWABLE_TABLET | ORAL | Status: DC

## 2012-03-31 MED ORDER — ONDANSETRON HCL 4 MG/2ML IJ SOLN: 4.0000 mg | Freq: Four times a day (QID) | INTRAMUSCULAR | Status: DC | PRN

## 2012-03-31 MED ORDER — HEPARIN SODIUM (PORCINE) 1000 UNIT/ML IJ SOLN
INTRAMUSCULAR | Status: AC
  Filled 2012-03-31: qty 1

## 2012-03-31 NOTE — Care Management Note (Addendum)
    Page 1 of 1   04/01/2012     2:14:33 PM   CARE MANAGEMENT NOTE 04/01/2012  Patient:  Tyler Brewer, Tyler Brewer   Account Number:  [id number]  Date Initiated:  03/31/2012  Documentation initiated by:  Junius Creamer  Subjective/Objective Assessment:   adm w ch pain     Action/Plan:   lives w family   Anticipated DC Date:     Anticipated DC Plan:        DC Planning Services  CM consult  [facility]      Choice offered to / List presented to:             Status of service:  Completed, signed off Medicare Important Message given?   (If response is "NO", the following Medicare IM given date fields will be blank) Date Medicare IM given:   Date Additional Medicare IM given:    Discharge Disposition:  HOME/SELF CARE  Per UR Regulation:  Reviewed for med. necessity/level of care/duration of stay  If discussed at Long Length of Stay Meetings, dates discussed:    Comments:  04/01/12-1003-J.Malaina Mortellaro,RN,BSN [phone]     In to speak with patient regarding community prescription cards and primary care information left for him yesterday. Also provided Health connect number for when his insurance is effective in a couple of weeks.  8/12 10:38a debbie dowell rn,bsn left inform w nse for pt about clinics in [place] that may may want to ck into for prim care. left 2 pt assist cards that may help w non genric meds. pt off unit at present.

## 2012-03-31 NOTE — Plan of Care (Signed)
Problem: Consults Goal: Chest Pain Patient Education (See Patient Education module for education specifics.)  Outcome: Completed/Met Date Met:  03/31/12 Ruled out for MI, patient questions if anxiety is the problem will consult social worker to follow up with outpatient referral   Problem: Phase I Progression Outcomes Goal: MD aware of Cardiac Marker results Outcome: Completed/Met Date Met:  03/31/12 Cardiac enzymes negative   Problem: Phase III Progression Outcomes Goal: Hemodynamically stable Outcome: Completed/Met Date Met:  03/31/12 VSS Goal: No anginal pain Outcome: Completed/Met Date Met:  03/31/12 No chest pain Goal: Cath/PCI Path as indicated Outcome: Completed/Met Date Met:  03/31/12 Cath today negative  Goal: Vascular site scale level 0 - I Vascular Site Scale Level 0: No bruising/bleeding/hematoma Level I (Mild): Bruising/Ecchymosis, minimal bleeding/ooozing, palpable hematoma < 3 cm Level II (Moderate): Bleeding not affecting hemodynamic parameters, pseudoaneurysm, palpable hematoma > 3 cm  Outcome: Completed/Met Date Met:  03/31/12 Right radial level 0 Goal: Discharge plan remains appropriate-arrangements made Outcome: Completed/Met Date Met:  03/31/12 Planned discharge home tomorrow Goal: Tolerating diet Outcome: Completed/Met Date Met:  03/31/12 Tolerated dinner and snacks tonight

## 2012-03-31 NOTE — Consult Note (Signed)
Reason for Consult: Chest pain  Requesting Physician: [facility]  HPI: This is a 51 y.o. male with a past medical history significant for DM, dyslipidemia, HTN, and a family history of early CAD. He recently moved here from [facility] after his wife left him and he lost his job. He has no insurance. In March he was on a cruise and when he got home he had chest pain and calf pain. He was admitted to the hospital in [facility] then. Work up for PE and DVT was negative. He was seen by a cardiologist then. 2D echo unremarkable. He was supposed to follow up after discharge but he says he was never contacted after the office found out he had no insurance. Saturday evening he was on the phone when he developed SSCP- "tightness" with SOB and mild sweating. His symptoms resolved spontaneously. Sunday he didn't feel well at church and went home. While showering he had recurrent SSCP this time associated with near syncope. He presented to the ER. Since admission he has been pain free. He denies any history of exertional symptoms.   PMHx:  Past Medical History  Diagnosis Date  . Hypertension   . Diabetes mellitus     poorly controlled by his report  . IBS (irritable bowel syndrome)   . History of narcotic addiction     past history of back pain  . Hypercholesteremia   . Testosterone deficiency   . Depression   . Obesity     Max weight was 390   History reviewed. No pertinent past surgical history.  FAMHx: History reviewed. No pertinent family history.  SOCHx:  reports that he has never smoked. He does not have any smokeless tobacco history on file. He reports that he does not drink alcohol or use illicit drugs.  ALLERGIES: No Known Allergies  ROS: He denies GI bleeding. He does have hemorrhoids and chronic constipation. He has obesity but has lost 74lbs over the last 16mos with diet. He has had kidney stones.He had chronic back pain but says this has improved with weight loss. He has been told he has an  anxiety disorder. He has OSA and is on C-Pap. 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  HOME MEDICATIONS: Prescriptions prior to admission  Medication Sig Dispense Refill  . amLODipine (NORVASC) 5 MG tablet Take 5 mg by mouth daily.      [name] Kitchen lisinopril (PRINIVIL,ZESTRIL) 10 MG tablet Take 10 mg by mouth daily.      . pravastatin (PRAVACHOL) 10 MG tablet Take 10 mg by mouth daily.      . sitaGLIPtan-metformin (JANUMET) 50-1000 MG per tablet Take 1 tablet by mouth 2 (two) times daily with a meal.        HOSPITAL MEDICATIONS: I have reviewed the patient's current medications.  VITALS: Blood pressure 138/80, pulse 74, temperature 97.5 F (36.4 C), temperature source Oral, resp. rate 17, height 5\' 11"  (1.803 m), weight 143.6 kg (316 lb 9.3 oz), SpO2 98.00%.  PHYSICAL EXAM: General appearance: alert, cooperative, no distress and morbidly obese Neck: no carotid bruit, no JVD, supple, symmetrical, trachea midline and thyroid not enlarged, symmetric, no tenderness/mass/nodules Lungs: clear to auscultation bilaterally Heart: regular rate and rhythm, S1, S2 normal, no murmur, click, rub or gallop Abdomen: soft, non-tender; bowel sounds normal; no masses,  no organomegaly and obese Extremities: extremities normal, atraumatic, no cyanosis or edema Pulses: 2+ and symmetric Skin: Skin color, texture, turgor normal. No rashes or lesions Neurologic: Grossly normal  LABS:  Results for orders placed during the hospital encounter of 03/30/12 (from the past 48 hour(s))  CBC     Status: Normal   Collection Time   03/30/12 12:10 PM      Component Value Range Comment   WBC 7.3  4.0 - 10.5 K/uL    RBC 4.83  4.22 - 5.81 MIL/uL    Hemoglobin 14.6  13.0 - 17.0 g/dL    HCT 52.8  41.3 - 24.4 %    MCV 85.7  78.0 - 100.0 fL    MCH 30.2  26.0 - 34.0 pg    MCHC 35.3  30.0 - 36.0 g/dL    RDW 01.0  27.2 - 53.6 %    Platelets 264  150 - 400 K/uL   BASIC METABOLIC PANEL     Status: Abnormal   Collection Time    03/30/12 12:10 PM      Component Value Range Comment   Sodium 135  135 - 145 mEq/L    Potassium 3.9  3.5 - 5.1 mEq/L    Chloride 99  96 - 112 mEq/L    CO2 23  19 - 32 mEq/L    Glucose, Bld 304 (*) 70 - 99 mg/dL    BUN 12  6 - 23 mg/dL    Creatinine, Ser 6.44  0.50 - 1.35 mg/dL    Calcium 9.4  8.4 - 03.4 mg/dL    GFR calc non Af Amer >90  >90 mL/min    GFR calc Af Amer >90  >90 mL/min   TROPONIN I     Status: Normal   Collection Time   03/30/12 12:10 PM      Component Value Range Comment   Troponin I <0.30  <0.30 ng/mL   MRSA PCR SCREENING     Status: Normal   Collection Time   03/30/12  3:44 PM      Component Value Range Comment   MRSA by PCR NEGATIVE  NEGATIVE   GLUCOSE, CAPILLARY     Status: Abnormal   Collection Time   03/30/12  4:34 PM      Component Value Range Comment   Glucose-Capillary 159 (*) 70 - 99 mg/dL    Comment 1 Documented in Chart      Comment 2 Notify RN     CARDIAC PANEL(CRET KIN+CKTOT+MB+TROPI)     Status: Normal   Collection Time   03/30/12  4:59 PM      Component Value Range Comment   Total CK 46  7 - 232 U/L    CK, MB 1.3  0.3 - 4.0 ng/mL    Troponin I <0.30  <0.30 ng/mL    Relative Index RELATIVE INDEX IS INVALID  0.0 - 2.5   CBC     Status: Normal   Collection Time   03/30/12  5:00 PM      Component Value Range Comment   WBC 7.8  4.0 - 10.5 K/uL    RBC 4.88  4.22 - 5.81 MIL/uL    Hemoglobin 14.7  13.0 - 17.0 g/dL    HCT 74.2  59.5 - 63.8 %    MCV 86.9  78.0 - 100.0 fL    MCH 30.1  26.0 - 34.0 pg    MCHC 34.7  30.0 - 36.0 g/dL    RDW 75.6  43.3 - 29.5 %    Platelets 199  150 - 400 K/uL   CREATININE, SERUM     Status: Normal   Collection Time  03/30/12  5:00 PM      Component Value Range Comment   Creatinine, Ser 0.61  0.50 - 1.35 mg/dL    GFR calc non Af Amer >90  >90 mL/min    GFR calc Af Amer >90  >90 mL/min   HEMOGLOBIN A1C     Status: Abnormal   Collection Time   03/30/12  5:00 PM      Component Value Range Comment   Hemoglobin A1C 8.1  (*) <5.7 %    Mean Plasma Glucose 186 (*) <117 mg/dL   GLUCOSE, CAPILLARY     Status: Abnormal   Collection Time   03/30/12  9:13 PM      Component Value Range Comment   Glucose-Capillary 221 (*) 70 - 99 mg/dL   CARDIAC PANEL(CRET KIN+CKTOT+MB+TROPI)     Status: Normal   Collection Time   03/31/12 12:02 AM      Component Value Range Comment   Total CK 40  7 - 232 U/L    CK, MB 1.2  0.3 - 4.0 ng/mL    Troponin I <0.30  <0.30 ng/mL    Relative Index RELATIVE INDEX IS INVALID  0.0 - 2.5   GLUCOSE, CAPILLARY     Status: Abnormal   Collection Time   03/31/12  7:55 AM      Component Value Range Comment   Glucose-Capillary 162 (*) 70 - 99 mg/dL     IMAGING: Dg Chest 2 View  03/30/2012  *RADIOLOGY REPORT*  Clinical Data: Intermittent left-sided chest pain since March, 2013.  Shortness of breath.  Headaches.  CHEST - 2 VIEW  Comparison: None.  Findings: Suboptimal inspiration due to body habitus which accounts for crowded bronchovascular markings at the bases and accentuates the cardiac silhouette.  Taking this into account, cardiomediastinal silhouette unremarkable and lungs clear.  No pleural effusions.  Visualized bony thorax intact.  IMPRESSION: Suboptimal inspiration.  No acute cardiopulmonary disease.  Original Report Authenticated By: Arnell Sieving, M.D.    IMPRESSION: Principal Problem:  *Unstable angina Active Problems:  DM type 2, uncontrolled, with neuropathy  Hyperlipemia  HTN (hypertension), benign  Obesity  Anxiety disorder  Family history of coronary artery disease  Sleep apnea, on C-pap  Precordial pain   EKG- NSR without acute changes  RECOMMENDATION: MD to see. He needs diagnostic cath with multiple cardiac risk factors and chest pain c/w [place].  Time Spent Directly with Patient: 40 minutes  KILROY,LUKE K 03/31/2012, 9:01 AM    I have seen and examined the patient along with Corine Shelter PA-C.  I have reviewed the chart, notes and new data.  I agree with  PA's note.  Key new complaints: no angina at rest, anxious Key examination changes: S4 heard Key new findings / data: negative enzymes and ECG  PLAN: Coronary angiogram risks and benefits discussed in detail. This procedure has been fully reviewed with the patient and written informed consent has been obtained.   Thurmon Fair, MD, [facility] [phone] 03/31/2012, 11:21 AM

## 2012-03-31 NOTE — CV Procedure (Signed)
[facility] PERCUTANEOUS CORONARY INTERVENTION REPORT  NAME:  Tyler Brewer   MRN: [id number] DOB:  27-May-1961   ADMIT DATE: 03/30/2012  INTERVENTIONAL CARDIOLOGIST: Marykay Lex, M.D., MS PRIMARY CARE PROVIDER: Provider Not In System PRIMARY CARDIOLOGIST:   PATIENT:  Tyler Brewer is a 51 y.o. malepast medical history significant for DM, dyslipidemia, HTN, and a family history of early CAD. He recently moved here from [facility] after his wife left him and he lost his job. He has no insurance. In March he was on a cruise and when he got home he had chest pain and calf pain. He was admitted to the hospital in [facility] then. Work up for PE and DVT was negative. He was seen by a cardiologist then. 2D echo unremarkable. He was supposed to follow up after discharge but he says he was never contacted after the office found out he had no insurance. Saturday evening he was on the phone when he developed SSCP- "tightness" with SOB and mild sweating. His symptoms resolved spontaneously. Sunday he didn't feel well at church and went home. While showering he had recurrent SSCP this time associated with near syncope. He presented to the ER. Since admission he has been pain free. He denies any history of exertional symptoms.  He was seen by Dr. Royann Shivers in consultation who recommended cardiac catheterization for evaluation of possible unstable angina.  PRE-OPERATIVE DIAGNOSIS:    Resting chest pain concerning for unstable angina  Poorly controlled diabetes type 2 now on insulin  PROCEDURES PERFORMED:    Left heart catheterization via 5 French Right Radial Artery access.  Left Ventriculography (RAO) 11 mm contrast per second for 3 seconds  PROCEDURE: Consent: The procedure with Risks/Benefits/Alternatives and Indications was reviewed with the patient (and family).  All questions were answered.    Risks / Complications include, but not limited to: Death, MI, CVA/TIA, VF/VT (with  defibrillation), Bradycardia (need for temporary pacer placement), contrast induced nephropathy, bleeding / bruising / hematoma / pseudoaneurysm, vascular or coronary injury (with possible emergent CT or Vascular Surgery), adverse medication reactions, infection.    The patient ( and family) voice understanding and agree to proceed.    Risks of procedure as well as the alternatives and risks of each were explained to the (patient/caregiver).  Consent for procedure obtained. Consent for signed by MD and patient with RN witness -- placed on chart.  PROCEDURE: The patient was brought to the 2nd Floor [facility] in the fasting state and prepped and draped in the usual sterile fashion for Right radial or groin access. Sterile technique was used including antiseptics, cap, gloves, gown, hand hygiene, mask and sheet.  Skin prep: Chlorhexidine;  Time Out: Verified patient identification, verified procedure, site/side was marked, verified correct patient position, special equipment/implants available, medications/allergies/relevent history reviewed, required imaging and test results available.  Performed  Access: Right Radial Artery; 5 Fr Sheath; Seldinger Technique Diagnostic: Initial catheter was advanced over in the final cath removed over a Versicore wire. Exchanged for over a long exchange safety J-wire.  Left Coronary Artery Angiography:  TIG 4.0  Right Coronary Artery Angiography: JL4  LV Hemodynamics (LV Gram): Angled Pigtail  Hemodynamics:  Central Aortic / Mean Pressures: 107/75 mmHg; 88 mmHg  Left Ventricular Pressures / EDP: 104/12 mmHg; 13 mmHg  TR Band:  1640 Hours, 12 mL air  Left Ventriculography:  EF: 60-65 %  Wall Motion: normal  Coronary Anatomy:  Left Main: Large-caliber vessel is off 2  large branches the LAD and circumflex as well as a small bifurcating ramus intermedius. Angiographically normal. LAD: Large caliber vessel nearly ectatic  with slow flow due to the size of the vessel. There are 2 small caliber diagonal branch with mild luminal irregularities in the mid vessel. The artery courses down around the apex tapering into a relatively small vessel downstream. Left Circumflex: Large caliber vessel gives rise to a small proximal marginal branch then in the atrial ventricular groove bifurcates into a large OM system and then continues on to give rise to multiple posterior lateral branches from the AV groove circumflex. Minimal luminal irregularities distally.  OM1: Large caliber branch that has 2 major branches the most superior which bifurcates reaching down almost to the inferoapex. Minimal luminal irregularities distally.  The AV groove circumflex gives off 3 major posterior lateral branches and one small distal portion lateral branch. Minimal luminal irregularities distally. Ramus intermedius: Small bifurcating vessel the more significant branch courses as a diagonal branch is smaller branch courses is a high obtuse marginal 2 this has multiple moderate luminal irregularities and is a very small-caliber vessel  RCA: Large caliber dominant vessel with a very proximal SA nodal artery as well as an atrial artery there are several small RV marginal branches. The vessel terminates as a right posterior descending artery only rise to very small posterior lateral branch the gives off the AV nodal artery. Minimal luminal irregularities.  ANESTHESIA:   Local Lidocaine 2 ml SEDATION:  2 mg IV Versed, 50 mcg IV fentanyl ; Premedication: 5 mg by mouth Valium MEDICATIONS: Radial Cocktail: 5 mg Verapamil, 400 mcg NTG, 2 ml 2% Lidocaine in 10 ml NS  Anticoagulation:  IV Heparin 6500 Units  EBL:   < 10 ml  PATIENT DISPOSITION:    The patient was transferred to the PACU holding area in a hemodynamicaly stable, chest pain free condition.  The patient tolerated the procedure well, and there were no complications.  The patient was stable  before, during, and after the procedure.  POST-OPERATIVE DIAGNOSIS:    Angiographically normal coronary arteries are going moderate disease in a septal mall branch of the ramus intermedius. No culprit lesion to explain resting chest pain as anginal chest pain.  Normal Left Ventricular End-Diastolic Pressure and normal Ejection Fraction with no note noted wall motion abnormalities.  PLAN OF CARE:  Standard post-radial cath care   Primary Team to adjust diabetes medications.  Despite discharge the morning as he'll be late at once he is fully recovered.  Discuss with Dr. Sharon Seller from [facility] 1.  Marykay Lex, M.D., M.S. [facility] [address] [place], [place]  [address]  [phone]  03/31/2012 5:08 PM

## 2012-03-31 NOTE — Progress Notes (Signed)
[facility] Progress Note [place] TEAM 1 - Stepdown/ICU TEAM   Shahiem Bedwell ZOX:[id number] DOB: March 18, 1961 DOA: 03/30/2012 PCP: Provider Not In System  Brief narrative: 51 year old male with past medical history hypertension, poorly controlled diabetes mellitus and hyperlipidemia who presents with several months of intermittent chest discomfort. In the last 24 hours however, it had been more unrelenting in nature. Initially it was described as a combination of heaviness and sharp pain low starting midsternal and radiating up to the left shoulder he also reports some left arm numbness as well as some pain and numbness going up the left side of his neck and face. He reported no associated shortness of breath, but the pain was so severe he found himself staggering in the shower today. He came into the emergency room and by the time he arrived in the emergency room, he felt like as if someone was standing on his chest. Initial EKG and cardiac markers were unremarkable.   Assessment/Plan:  Unstable angina pectoris Given the patient's risk factor profile and symptom complex I agree that cardiac cath is clearly indicated - cardiology follow  Uncontrolled diabetes mellitus Poorly controlled - A1c elevated at 8.1 - adjust treatment and follow  Hypertension Well-controlled at the present time  History of narcotic addiction Avoid narcotic pain medications  Hypercholesterolemia On medical treatment at this time  Depression  Obesity  Code Status: Full Disposition Plan: Eventual discharge home  Consultants: [facility]  Procedures: None thus far  Antibiotics: None  DVT prophylaxis: Fully anticoagulated  HPI/Subjective: The patient is resting cochlea present.  He reports his chest pain has now resolved.  He denies shortness of breath fevers chills nausea or vomiting.   Objective: Blood pressure 138/80, pulse 74, temperature 97.5 F (36.4 C), temperature  source Oral, resp. rate 17, height 5\' 11"  (1.803 m), weight 143.6 kg (316 lb 9.3 oz), SpO2 98.00%.  Intake/Output Summary (Last 24 hours) at 03/31/12 0903 Last data filed at 03/30/12 2300  Gross per 24 hour  Intake    840 ml  Output    400 ml  Net    440 ml     Exam: General: No acute respiratory distress Lungs: Clear to auscultation bilaterally without wheezes or crackles Cardiovascular: Regular rate and rhythm without murmur gallop or rub normal S1 and S2 Abdomen: Overweight, nontender, nondistended, soft, bowel sounds positive, no rebound, no ascites, no appreciable mass Extremities: No significant cyanosis, clubbing, or edema bilateral lower extremities  Data Reviewed: Basic Metabolic Panel:  Lab 03/30/12 8119 03/30/12 1210  NA -- 135  K -- 3.9  CL -- 99  CO2 -- 23  GLUCOSE -- 304*  BUN -- 12  CREATININE 0.61 0.70  CALCIUM -- 9.4  MG -- --  PHOS -- --   CBC:  Lab 03/30/12 1700 03/30/12 1210  WBC 7.8 7.3  NEUTROABS -- --  HGB 14.7 14.6  HCT 42.4 41.4  MCV 86.9 85.7  PLT 199 264   Cardiac Enzymes:  Lab 03/31/12 0002 03/30/12 1659 03/30/12 1210  CKTOTAL 40 46 --  CKMB 1.2 1.3 --  CKMBINDEX -- -- --  TROPONINI <0.30 <0.30 <0.30   CBG:  Lab 03/31/12 0755 03/30/12 2113 03/30/12 1634  GLUCAP 162* 221* 159*    Recent Results (from the past 240 hour(s))  MRSA PCR SCREENING     Status: Normal   Collection Time   03/30/12  3:44 PM      Component Value Range Status Comment   MRSA by PCR NEGATIVE  NEGATIVE Final      Studies:  Recent x-ray studies have been reviewed in detail by the Attending Physician  Scheduled Meds:  Reviewed in detail by the Attending Physician   Lonia Blood, MD [facility] Office  [phone] Pager [phone]  On-Call/Text Page:      [email or web address]      password [email or web address]  If 7PM-7AM, please contact night-coverage [email or web address] Password [email or web address] 03/31/2012, 9:03 AM   LOS: 1 day

## 2012-04-01 ENCOUNTER — Other Ambulatory Visit: Payer: Self-pay

## 2012-04-01 ENCOUNTER — Encounter (HOSPITAL_COMMUNITY): Payer: Self-pay | Admitting: Cardiology

## 2012-04-01 DIAGNOSIS — R079 Chest pain, unspecified: Secondary | ICD-10-CM

## 2012-04-01 DIAGNOSIS — I10 Essential (primary) hypertension: Secondary | ICD-10-CM

## 2012-04-01 DIAGNOSIS — R109 Unspecified abdominal pain: Secondary | ICD-10-CM | POA: Diagnosis present

## 2012-04-01 DIAGNOSIS — I2510 Atherosclerotic heart disease of native coronary artery without angina pectoris: Secondary | ICD-10-CM

## 2012-04-01 DIAGNOSIS — F1920 Other psychoactive substance dependence, uncomplicated: Secondary | ICD-10-CM | POA: Diagnosis not present

## 2012-04-01 HISTORY — DX: Atherosclerotic heart disease of native coronary artery without angina pectoris: I25.10

## 2012-04-01 LAB — BASIC METABOLIC PANEL
BUN: 9 mg/dL (ref 6–23)
CO2: 28 mEq/L (ref 19–32)
Calcium: 9.4 mg/dL (ref 8.4–10.5)
Chloride: 102 mEq/L (ref 96–112)
Creatinine, Ser: 0.7 mg/dL (ref 0.50–1.35)
GFR calc Af Amer: >90 mL/min (ref >90)
GFR calc non Af Amer: >90 mL/min (ref >90)
Glucose, Bld: 223 mg/dL — ABNORMAL HIGH (ref 70–99)
Potassium: 3.8 mEq/L (ref 3.5–5.1)
Sodium: 140 mEq/L (ref 135–145)

## 2012-04-01 MED ORDER — CITALOPRAM HYDROBROMIDE 20 MG PO TABS: 20.0000 mg | ORAL_TABLET | Freq: Every day | ORAL | Status: DC

## 2012-04-01 MED ORDER — GLIPIZIDE 10 MG PO TABS: 10.0000 mg | ORAL_TABLET | Freq: Two times a day (BID) | ORAL | Status: DC

## 2012-04-01 NOTE — Progress Notes (Signed)
Subjective: No complaints  Objective: Vital signs in last 24 hours: Temp:  [97.6 F (36.4 C)-97.9 F (36.6 C)] 97.6 F (36.4 C) (08/13 0800) Pulse Rate:  [63-80] 63  (08/13 0800) Resp:  [9-17] 14  (08/13 0800) BP: (105-135)/(64-86) 117/67 mmHg (08/13 0800) SpO2:  [95 %-99 %] 95 % (08/13 0800) Weight:  [142 kg (313 lb 0.9 oz)-144 kg (317 lb 7.4 oz)] 144 kg (317 lb 7.4 oz) (08/13 0700) Weight change: -3.151 kg (-6 lb 15.2 oz) Last BM Date: 03/30/12 Intake/Output from previous day: -581 08/12 0701 - 08/13 0700 In: 468.8 [I.V.:468.8] Out: 1050 [Urine:1050] Intake/Output this shift:    PE: General: Heart: Lungs: Abd: Ext: Neuro:   Lab Results:  Basename 03/30/12 1700 03/30/12 1210  WBC 7.8 7.3  HGB 14.7 14.6  HCT 42.4 41.4  PLT 199 264   BMET  Basename 03/30/12 1700 03/30/12 1210  NA -- 135  K -- 3.9  CL -- 99  CO2 -- 23  GLUCOSE -- 304*  BUN -- 12  CREATININE 0.61 0.70  CALCIUM -- 9.4    Basename 03/31/12 0822 03/31/12 0002  TROPONINI <0.30 <0.30    No results found for this basename: CHOL,  HDL,  LDLCALC,  LDLDIRECT,  TRIG,  CHOLHDL   Lab Results  Component Value Date   HGBA1C 8.1* 03/30/2012       EKG: Orders placed during the hospital encounter of 03/30/12  . ED EKG  . ED EKG  . EKG 12-LEAD  . EKG 12-LEAD  . EKG 12-LEAD  . EKG 12-LEAD    Procedures: 03/31/12 Cardiac cath  POST-OPERATIVE DIAGNOSIS:  Angiographically normal coronary arteries are going moderate disease in a septal mall branch of the ramus intermedius. No culprit lesion to explain resting chest pain as anginal chest pain.  Normal Left Ventricular End-Diastolic Pressure and normal Ejection Fraction with no note noted wall motion abnormalities.   Studies/Results: Dg Chest 2 View  03/30/2012  *RADIOLOGY REPORT*  Clinical Data: Intermittent left-sided chest pain since March, 2013.  Shortness of breath.  Headaches.  CHEST - 2 VIEW  Comparison: None.  Findings: Suboptimal  inspiration due to body habitus which accounts for crowded bronchovascular markings at the bases and accentuates the cardiac silhouette.  Taking this into account, cardiomediastinal silhouette unremarkable and lungs clear.  No pleural effusions.  Visualized bony thorax intact.  IMPRESSION: Suboptimal inspiration.  No acute cardiopulmonary disease.  Original Report Authenticated By: Arnell Sieving, M.D.    Medications: I have reviewed medications    . amLODipine  5 mg Oral Daily  . aspirin      . diazepam  5 mg Oral On Call  . fentaNYL      . heparin      . heparin      . insulin aspart  0-15 Units Subcutaneous TID WC  . insulin aspart  0-5 Units Subcutaneous QHS  . insulin glargine  10 Units Subcutaneous QHS  . lidocaine      . linagliptin  5 mg Oral Daily  . lisinopril  10 mg Oral Daily  . midazolam      . nitroGLYCERIN      . pneumococcal 23 valent vaccine  0.5 mL Intramuscular Tomorrow-1000  . psyllium  1 packet Oral Daily  . simvastatin  5 mg Oral q1800  . verapamil      . zolpidem  10 mg Oral Once  . DISCONTD: aspirin  324 mg Oral Pre-Cath  . DISCONTD: enoxaparin (LOVENOX) injection  70 mg Subcutaneous Q24H  . DISCONTD: enoxaparin (LOVENOX) injection  70 mg Subcutaneous Q24H  . DISCONTD: insulin glargine  10 Units Subcutaneous QHS  . DISCONTD: sodium chloride  3 mL Intravenous Q12H  . DISCONTD: sodium chloride  3 mL Intravenous Q12H   Assessment/Plan: Principal Problem:  *Unstable angina, negative MI Active Problems:  Precordial pain  Hyperlipemia  HTN (hypertension), benign  Obesity  DM type 2, uncontrolled, with neuropathy  Anxiety disorder  Family history of coronary artery disease  Sleep apnea, on C-pap  CAD (coronary artery disease)  cardiac cath with moderate disease in a septal branch of the ramus intermedius  Plan:  Cath stable, would treat his mild CAD with medications.  Follow up with cardiology PRN.Continue statin.    LOS: 2 days   INGOLD,LAURA  R 04/01/2012, 8:55 AM   Agree with note written by Nada Boozer RNP  S/P cath via right radial. Essentially clean coronary arteries. Non cardiac CP. Exam benign. Will check Bmet. Hold metformin for 48 hours. OK for D/C home from our point of view. ROV with [place] 2-3 weeks. Needs PCP.  Runell Gess 04/01/2012 9:11 AM

## 2012-04-01 NOTE — Clinical Social Work Psychosocial (Signed)
     Clinical Social Work Department BRIEF PSYCHOSOCIAL ASSESSMENT 04/01/2012  Patient:  Tyler Brewer, Tyler Brewer     Account Number:  [id number]     Admit date:  03/30/2012  Clinical Social Worker:  Pearson Forster  Date/Time:  04/01/2012 02:30 PM  Referred by:  Physician  Date Referred:  04/01/2012 Referred for  Crisis Intervention   Other Referral:   Interview type:  Patient Other interview type:   Patient asked mother to step into the hallway    PSYCHOSOCIAL DATA Living Status:  PARENTS Admitted from facility:   Level of care:   Primary support name:  Johneric, Mcfadden  [phone] Primary support relationship to patient:  PARENT Degree of support available:   Strong    CURRENT CONCERNS Current Concerns  Other - See comment   Other Concerns:   Outpatient therapy request due to recent life changes    SOCIAL WORK ASSESSMENT / PLAN Clinical Social Worker met with patient at bedside to offer support following a referral concerning patient depressive symptoms and anxious feelings.  Patient states that he feels like this hospitalization was brought on by stress due to recent changes in his life.  Patient states that he just split with his wife 4 months ago, lost his job, moved from [facility] back to [place] with his mother, and has not spoken with his 69 year old son and 49 year old daughter since the split.  Patient openly admits to having an affair with another woman which is what led to the split.    Patient is requesting resources for outpatient therapy follow up once he leaves the hospital.  Patient does not have insurance coverage yet with his new job.  Patient is open to any and all options.  Patient clearly does not verbalize any suicidal or homicidal ideation when asked specifically.    Clinical Social Worker signing off - no further social work needs identified during hospitalization.  Patient plans to discharge back home today.   Assessment/plan status:  No Further Intervention  Required Other assessment/ plan:   Information/referral to community resources:   Visual merchandiser provided patient with a variety of outside providers for outpatient therapy.  Patient understands that his options may be more limited until he is able to establish coverage at his new job.  Patient was given two different crisis phone numbers in the event of an emergency.    PATIENTS/FAMILYS RESPONSE TO PLAN OF CARE: Patient alert and oriented x3 in the bed.  Patient is appropriately tearful but has a very nervous laugh associated.  Patient is seeking help at this point for the many changes in his life that have led to depression/anxiety.  Patient with supportive family and has found a new job back in the area.  Patient remains in a relationship with the woman he had an affair with as well. Patient verbalizes his appreciation for the resources and the opportunity to verbalize his issues.  Patient does not express concerns about returning home and understands the role of the resources provided.  Patient agreeable to arrange his own appointments upon discharge.

## 2012-04-01 NOTE — Progress Notes (Signed)
Please see Dr. Hazle Coca note for exam.  Exam done by Dr. Allyson Sabal.

## 2012-04-01 NOTE — Discharge Summary (Signed)
Physician Discharge Summary  Tyler Brewer NWG:[id number] DOB: 1961/05/06 DOA: 03/30/2012  PCP: Provider Not In System  Admit date: 03/30/2012 Discharge date: 04/01/2012  Recommendations for Outpatient Follow-up:  1. Needs to find PCP in this area  Discharge Diagnoses:  Principal Problem:  *Chest pain Active Problems:  CAD (coronary artery disease)  cardiac cath with moderate disease in a septal branch of the ramus intermedius  Hyperlipemia  HTN (hypertension), benign  Obesity  DM type 2, uncontrolled, with neuropathy  Anxiety disorder  Family history of coronary artery disease  Sleep apnea, on C-pap  Drug abuse and dependence   Discharge Condition: stable  Diet recommendation: low fat, diabetic, low sodium diet  Filed Weights   03/30/12 1602 03/31/12 1032 04/01/12 0700  Weight: 143.6 kg (316 lb 9.3 oz) 142 kg (313 lb 0.9 oz) 144 kg (317 lb 7.4 oz)    History of present illness:  Patient is a 51 year old white male with past medical history hypertension, poorly controlled diabetes mellitus and hyperlipidemia who presents with several months of intermittent chest discomfort. In the last 24 hours however, it is min more unrelenting in nature. Initially it was described as a combination of heaviness and sharp pain low starting midsternal and radiating up to the left shoulder he also reports some left arm numbness as well as some pain and numbness going up the left side of his neck and face. He reported no associated shortness of breath, but the pain was so severe he found himself staggering in the shower today. He came into the emergency room and by the time he arrived in the emergency room, he felt like as if someone was standing on his chest. Initial EKG and cardiac markers were unremarkable. All lab work was unremarkable as well. Given his multiple risk factors, it was felt best that he come in to the hospital to be observed overnight. He was [facility]. Patient received several doses of nitroglycerin which took his pain down to about a 3-2/10. Because he was still having pain persisting, he was sent over to the step down unit.   Hospital Course:   Chest pain- highly suspicious for unstable angina-  Cardiac cath performed on 8/12  : - Angiographically normal coronary arteries - moderate disease in a septal mall branch of the ramus intermedius. No culprit lesion to explain resting chest pain as anginal chest pain.  Normal Left Ventricular End-Diastolic Pressure and normal Ejection Fraction with no note noted wall motion abnormalities.  Uncontrolled diabetes mellitus  Poorly controlled - A1c elevated at 8.1 - added Glipizide to Janumet  Hypertension  Well-controlled at the present time   History of narcotic addiction  Avoid addictive medications- his mother states that this abuse occurred as recent as this past March.   Hypercholesterolemia  On medical treatment at this time   Depression  Celexa added to medication regimen- Zoloft has resulted in nausea in the past- I did reccommend that Lexapro would be the best in regards to nausea- he will be able to request it once he has insurance which should be in 6 wks. Pt requesting Xanax which I have cautioned against as it has a highly addictive potential. He expressed understanding.   Obesity  Advised to lose weight with diet and exercise.    [facility]  Discharge Exam: Filed Vitals:   04/01/12 1227  BP: 140/76  Pulse: 77  Temp: 97.4 F (36.3 C)  Resp: 15   Filed Vitals:   04/01/12 0500 04/01/12 0700 04/01/12 0800 04/01/12 1227  BP: 135/86  117/67 140/76  Pulse:   63 77  Temp: 97.8 F (36.6 C)  97.6 F (36.4 C) 97.4 F (36.3 C)  TempSrc:   Oral Oral  Resp:   14 15  Height:      Weight:  144 kg (317 lb 7.4 oz)    SpO2:   95% 95%    General: no acute distress, morbidly obese Cardiovascular: RRR, no  murmurs Respiratory: CTA b/l   Discharge Instructions  Discharge Orders    Future Orders Please Complete By Expires   Diet - low sodium heart healthy and diabetic     Increase activity slowly      Discharge instructions      Comments:   If AM CBG (fasting) is elevated greater that 120, increase your bedtime dose of Glipizide to 10 mg.     Medication List  As of 04/01/2012 12:37 PM   TAKE these medications         amLODipine 5 MG tablet   Commonly known as: NORVASC   Take 5 mg by mouth daily.      citalopram 20 MG tablet   Commonly known as: CELEXA   Take 1 tablet (20 mg total) by mouth daily.      glipiZIDE 10 MG tablet   Commonly known as: GLUCOTROL   Take 1 tablet (10 mg total) by mouth 2 (two) times daily before a meal.      lisinopril 10 MG tablet   Commonly known as: PRINIVIL,ZESTRIL   Take 10 mg by mouth daily.      pravastatin 10 MG tablet   Commonly known as: PRAVACHOL   Take 10 mg by mouth daily.      sitaGLIPtan-metformin 50-1000 MG per tablet   Commonly known as: JANUMET   Take 1 tablet by mouth 2 (two) times daily with a meal.           Follow-up Information    Follow up with Thurmon Fair, MD on 04/15/2012. (at 2:00 pm with [facility])    Contact information:   [address] [place] [address] [phone]           The results of significant diagnostics from this hospitalization (including imaging, microbiology, ancillary and laboratory) are listed below for reference.    Significant Diagnostic Studies: Dg Chest 2 View  03/30/2012  *RADIOLOGY REPORT*  Clinical Data: Intermittent left-sided chest pain since March, 2013.  Shortness of breath.  Headaches.  CHEST - 2 VIEW  Comparison: None.  Findings: Suboptimal inspiration due to body habitus which accounts for crowded bronchovascular markings at the bases and accentuates the cardiac silhouette.  Taking this into account, cardiomediastinal silhouette unremarkable  and lungs clear.  No pleural effusions.  Visualized bony thorax intact.  IMPRESSION: Suboptimal inspiration.  No acute cardiopulmonary disease.  Original Report Authenticated By: Arnell Sieving, M.D.    Microbiology: Recent Results (from the past 240 hour(s))  MRSA PCR SCREENING     Status: Normal   Collection Time   03/30/12  3:44 PM      Component Value Range Status Comment   MRSA by PCR NEGATIVE  NEGATIVE Final      Labs: Basic Metabolic Panel:  Lab 04/01/12 9147 03/30/12 1700 03/30/12 1210  NA 140 -- 135  K 3.8 -- 3.9  CL 102 -- 99  CO2 28 -- 23  GLUCOSE 223* -- 304*  BUN 9 -- 12  CREATININE 0.70 0.61 0.70  CALCIUM 9.4 -- 9.4  MG -- -- --  PHOS -- -- --   Liver Function Tests: No results found for this basename: AST:5,ALT:5,ALKPHOS:5,BILITOT:5,PROT:5,ALBUMIN:5 in the last 168 hours No results found for this basename: LIPASE:5,AMYLASE:5 in the last 168 hours No results found for this basename: AMMONIA:5 in the last 168 hours CBC:  Lab 03/30/12 1700 03/30/12 1210  WBC 7.8 7.3  NEUTROABS -- --  HGB 14.7 14.6  HCT 42.4 41.4  MCV 86.9 85.7  PLT 199 264   Cardiac Enzymes:  Lab 03/31/12 0822 03/31/12 0002 03/30/12 1659 03/30/12 1210  CKTOTAL 31 40 46 --  CKMB 1.1 1.2 1.3 --  CKMBINDEX -- -- -- --  TROPONINI <0.30 <0.30 <0.30 <0.30   BNP: BNP (last 3 results) No results found for this basename: PROBNP:3 in the last 8760 hours CBG:  Lab 03/31/12 2328 03/31/12 1704 03/31/12 1159 03/31/12 0755 03/30/12 2113  GLUCAP 167* 135* 174* 162* 221*    Time coordinating discharge: >45 minutes  Signed:  Paisyn Guercio  [facility] 04/01/2012, 12:37 PM

## 2012-04-01 NOTE — Plan of Care (Signed)
Problem: Discharge Progression Outcomes Goal: Discharge plan in place and appropriate Outcome: Completed/Met Date Met:  04/01/12 Planned discharge for today Goal: Activity appropriate for discharge plan Outcome: Completed/Met Date Met:  04/01/12 Patient ambulating with no s/s intolerance

## 2012-05-04 ENCOUNTER — Observation Stay (HOSPITAL_BASED_OUTPATIENT_CLINIC_OR_DEPARTMENT_OTHER)
Admission: EM | Admit: 2012-05-04 | Discharge: 2012-05-06 | Disposition: A | Discharge status: Home / Self Care | Payer: Self-pay | Attending: Internal Medicine | Admitting: Internal Medicine

## 2012-05-04 ENCOUNTER — Emergency Department (HOSPITAL_BASED_OUTPATIENT_CLINIC_OR_DEPARTMENT_OTHER): Payer: Self-pay

## 2012-05-04 ENCOUNTER — Encounter (HOSPITAL_BASED_OUTPATIENT_CLINIC_OR_DEPARTMENT_OTHER): Payer: Self-pay | Admitting: Emergency Medicine

## 2012-05-04 DIAGNOSIS — F329 Major depressive disorder, single episode, unspecified: Secondary | ICD-10-CM | POA: Insufficient documentation

## 2012-05-04 DIAGNOSIS — F411 Generalized anxiety disorder: Secondary | ICD-10-CM | POA: Insufficient documentation

## 2012-05-04 DIAGNOSIS — F410 Panic disorder [episodic paroxysmal anxiety] without agoraphobia: Secondary | ICD-10-CM | POA: Insufficient documentation

## 2012-05-04 DIAGNOSIS — F1920 Other psychoactive substance dependence, uncomplicated: Secondary | ICD-10-CM | POA: Insufficient documentation

## 2012-05-04 DIAGNOSIS — E785 Hyperlipidemia, unspecified: Secondary | ICD-10-CM | POA: Insufficient documentation

## 2012-05-04 DIAGNOSIS — E1140 Type 2 diabetes mellitus with diabetic neuropathy, unspecified: Secondary | ICD-10-CM

## 2012-05-04 DIAGNOSIS — F3289 Other specified depressive episodes: Secondary | ICD-10-CM | POA: Insufficient documentation

## 2012-05-04 DIAGNOSIS — I10 Essential (primary) hypertension: Secondary | ICD-10-CM | POA: Insufficient documentation

## 2012-05-04 DIAGNOSIS — R404 Transient alteration of awareness: Secondary | ICD-10-CM | POA: Insufficient documentation

## 2012-05-04 DIAGNOSIS — G8929 Other chronic pain: Secondary | ICD-10-CM | POA: Insufficient documentation

## 2012-05-04 DIAGNOSIS — F419 Anxiety disorder, unspecified: Secondary | ICD-10-CM

## 2012-05-04 DIAGNOSIS — R197 Diarrhea, unspecified: Secondary | ICD-10-CM | POA: Insufficient documentation

## 2012-05-04 DIAGNOSIS — E1165 Type 2 diabetes mellitus with hyperglycemia: Secondary | ICD-10-CM

## 2012-05-04 DIAGNOSIS — R55 Syncope and collapse: Principal | ICD-10-CM

## 2012-05-04 DIAGNOSIS — E119 Type 2 diabetes mellitus without complications: Secondary | ICD-10-CM | POA: Insufficient documentation

## 2012-05-04 DIAGNOSIS — IMO0002 Reserved for concepts with insufficient information to code with codable children: Secondary | ICD-10-CM

## 2012-05-04 DIAGNOSIS — G4733 Obstructive sleep apnea (adult) (pediatric): Secondary | ICD-10-CM | POA: Insufficient documentation

## 2012-05-04 DIAGNOSIS — F988 Other specified behavioral and emotional disorders with onset usually occurring in childhood and adolescence: Secondary | ICD-10-CM | POA: Insufficient documentation

## 2012-05-04 HISTORY — DX: Dependence on other enabling machines and devices: Z99.89

## 2012-05-04 HISTORY — DX: Obstructive sleep apnea (adult) (pediatric): G47.33

## 2012-05-04 HISTORY — DX: Panic disorder (episodic paroxysmal anxiety): F41.0

## 2012-05-04 LAB — URINALYSIS, ROUTINE W REFLEX MICROSCOPIC
Bilirubin Urine: NEGATIVE
Glucose, UA: 500 mg/dL — AB
Ketones, ur: NEGATIVE mg/dL
Leukocytes, UA: NEGATIVE
Nitrite: NEGATIVE
Protein, ur: NEGATIVE mg/dL
Specific Gravity, Urine: 1.021 (ref 1.005–1.030)
Urobilinogen, UA: 0.2 mg/dL (ref 0.0–1.0)
pH: 6 (ref 5.0–8.0)

## 2012-05-04 LAB — CBC
HCT: 40.1 % (ref 39.0–52.0)
Hemoglobin: 13.8 g/dL (ref 13.0–17.0)
MCH: 30 pg (ref 26.0–34.0)
MCHC: 34.4 g/dL (ref 30.0–36.0)
MCV: 87.2 fL (ref 78.0–100.0)
Platelets: 264 10*3/uL (ref 150–400)
RBC: 4.6 MIL/uL (ref 4.22–5.81)
RDW: 13.2 % (ref 11.5–15.5)
WBC: 7.2 10*3/uL (ref 4.0–10.5)

## 2012-05-04 LAB — COMPREHENSIVE METABOLIC PANEL
ALT: 24 U/L (ref 0–53)
AST: 16 U/L (ref 0–37)
Albumin: 3.5 g/dL (ref 3.5–5.2)
Alkaline Phosphatase: 57 U/L (ref 39–117)
BUN: 10 mg/dL (ref 6–23)
CO2: 25 mEq/L (ref 19–32)
Calcium: 9.2 mg/dL (ref 8.4–10.5)
Chloride: 99 mEq/L (ref 96–112)
Creatinine, Ser: 0.7 mg/dL (ref 0.50–1.35)
GFR calc Af Amer: >90 mL/min (ref >90)
GFR calc non Af Amer: >90 mL/min (ref >90)
Glucose, Bld: 223 mg/dL — ABNORMAL HIGH (ref 70–99)
Potassium: 3.7 mEq/L (ref 3.5–5.1)
Sodium: 136 mEq/L (ref 135–145)
Total Bilirubin: 0.3 mg/dL (ref 0.3–1.2)
Total Protein: 6.7 g/dL (ref 6.0–8.3)

## 2012-05-04 LAB — GLUCOSE, CAPILLARY: Glucose-Capillary: 157 mg/dL — ABNORMAL HIGH (ref 70–99)

## 2012-05-04 LAB — URINE MICROSCOPIC-ADD ON

## 2012-05-04 LAB — CBC WITH DIFFERENTIAL/PLATELET
Basophils Absolute: 0 10*3/uL (ref 0.0–0.1)
Basophils Relative: 0 % (ref 0–1)
Eosinophils Absolute: 0.1 10*3/uL (ref 0.0–0.7)
Eosinophils Relative: 1 % (ref 0–5)
HCT: 40.9 % (ref 39.0–52.0)
Hemoglobin: 14 g/dL (ref 13.0–17.0)
Lymphocytes Relative: 20 % (ref 12–46)
Lymphs Abs: 1.3 10*3/uL (ref 0.7–4.0)
MCH: 30 pg (ref 26.0–34.0)
MCHC: 34.2 g/dL (ref 30.0–36.0)
MCV: 87.6 fL (ref 78.0–100.0)
Monocytes Absolute: 0.7 10*3/uL (ref 0.1–1.0)
Monocytes Relative: 11 % (ref 3–12)
Neutro Abs: 4.4 10*3/uL (ref 1.7–7.7)
Neutrophils Relative %: 68 % (ref 43–77)
Platelets: 283 10*3/uL (ref 150–400)
RBC: 4.67 MIL/uL (ref 4.22–5.81)
RDW: 13.2 % (ref 11.5–15.5)
WBC: 6.5 10*3/uL (ref 4.0–10.5)

## 2012-05-04 LAB — TROPONIN I: Troponin I: <0.3 ng/mL (ref <0.30)

## 2012-05-04 LAB — RAPID URINE DRUG SCREEN, HOSP PERFORMED
Amphetamines: NOT DETECTED
Barbiturates: NOT DETECTED
Benzodiazepines: NOT DETECTED
Cocaine: NOT DETECTED
Opiates: NOT DETECTED
Tetrahydrocannabinol: NOT DETECTED

## 2012-05-04 LAB — CREATININE, SERUM
Creatinine, Ser: 0.65 mg/dL (ref 0.50–1.35)
GFR calc Af Amer: >90 mL/min (ref >90)
GFR calc non Af Amer: >90 mL/min (ref >90)

## 2012-05-04 IMAGING — CT CT HEAD W/O CM
1 of 2 series · 13 of 30 positions shown, 17 images · non-contrast
Comparison: None.

CLINICAL DATA: Syncopal episode.  Weakness with diarrhea.  History
of hypertension and diabetes.

CT HEAD WITHOUT CONTRAST
TECHNIQUE: Contiguous axial images were obtained from the base of
the skull through the vertex without contrast.

[Series 2: head 4.8 h37s · axial · 0.46mm/px · z∈[-155,-6]mm · 13 of 36 slices shown, 17 images]
[im 3/36  brain]
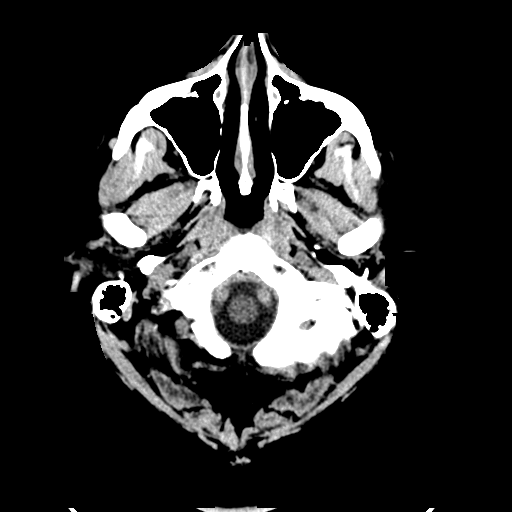
[im 3/36  bone]
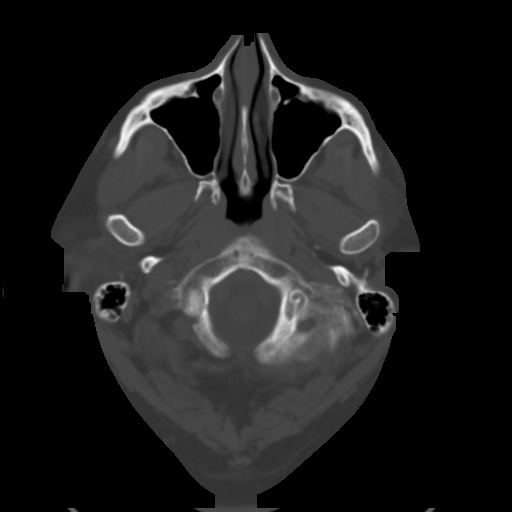
[im 6/36  brain]
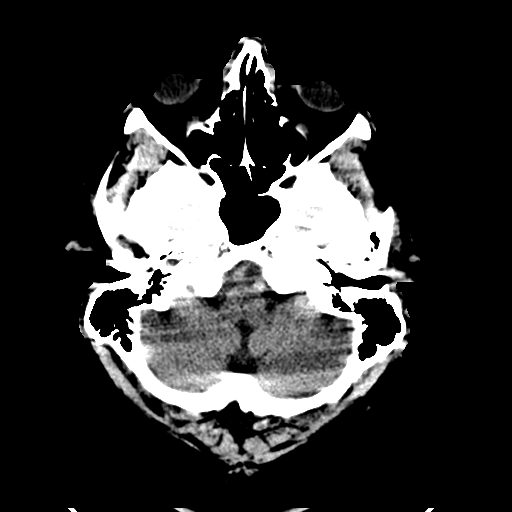
[im 8/36  brain]
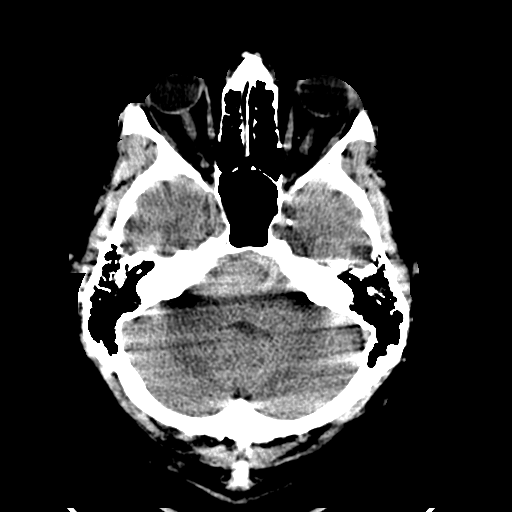
[im 11/36  brain]
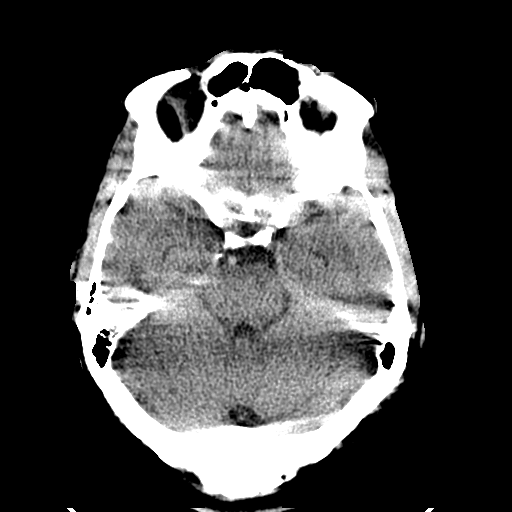
[im 13/36  brain]
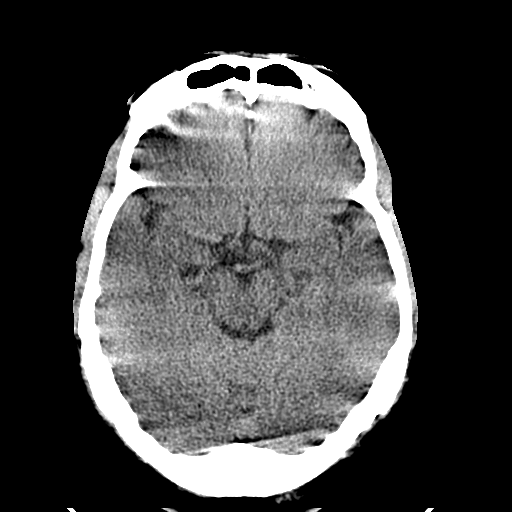
[im 13/36  bone]
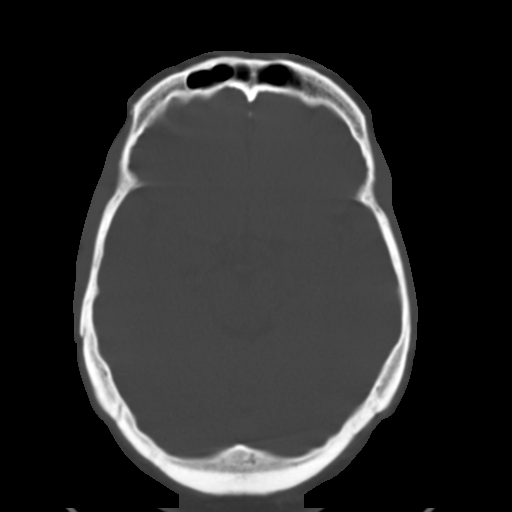
[im 16/36  brain]
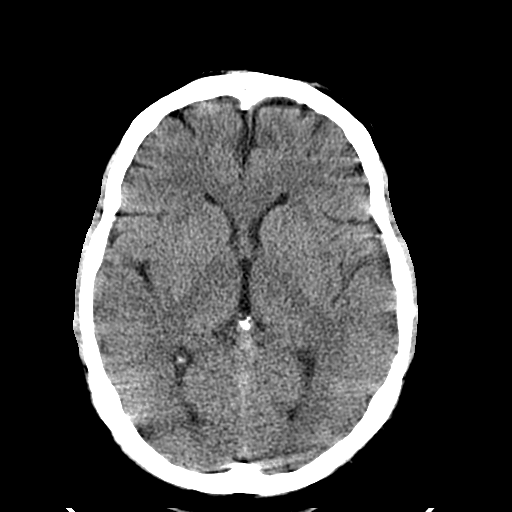
[im 18/36  brain]
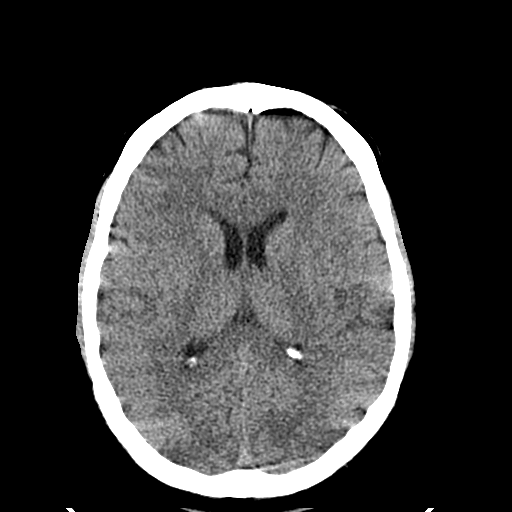
[im 21/36  brain]
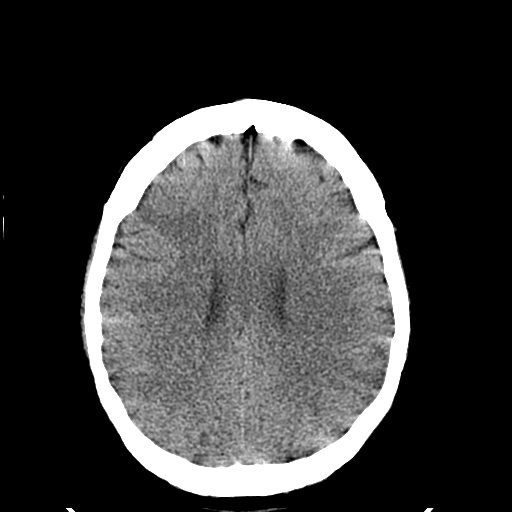
[im 23/36  brain]
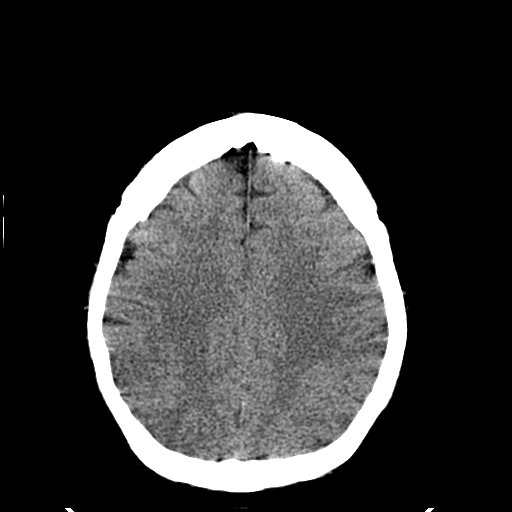
[im 23/36  bone]
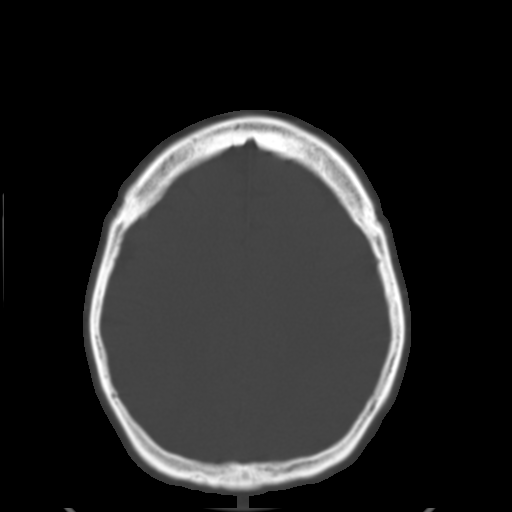
[im 26/36  brain]
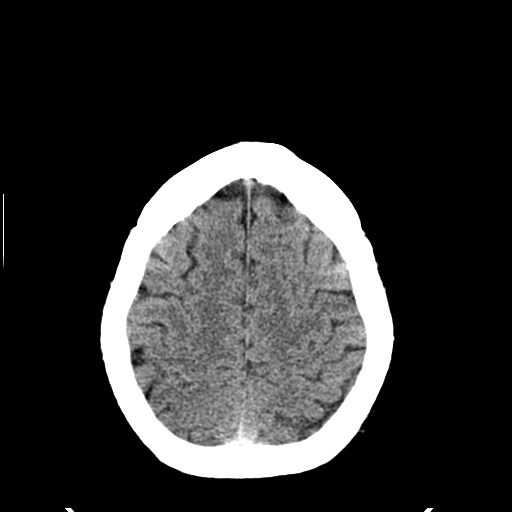
[im 28/36  brain]
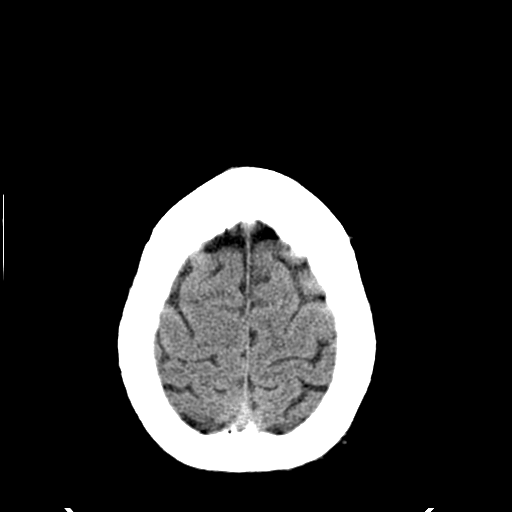
[im 31/36  brain]
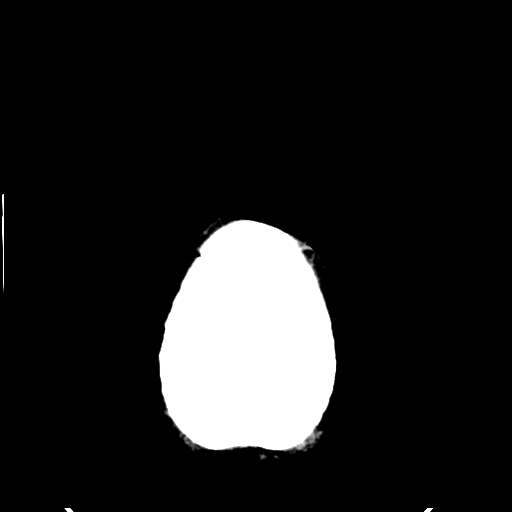
[im 33/36  brain]
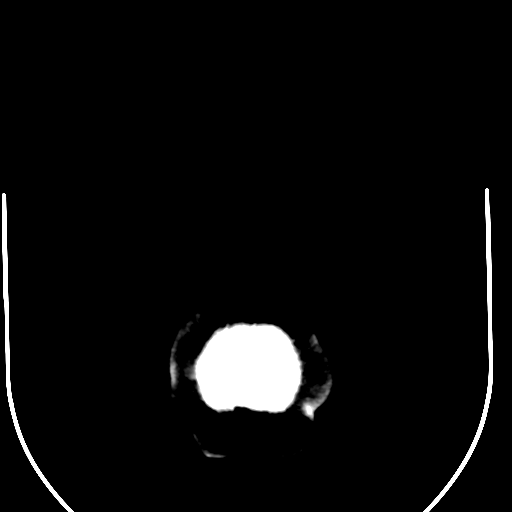
[im 33/36  bone]
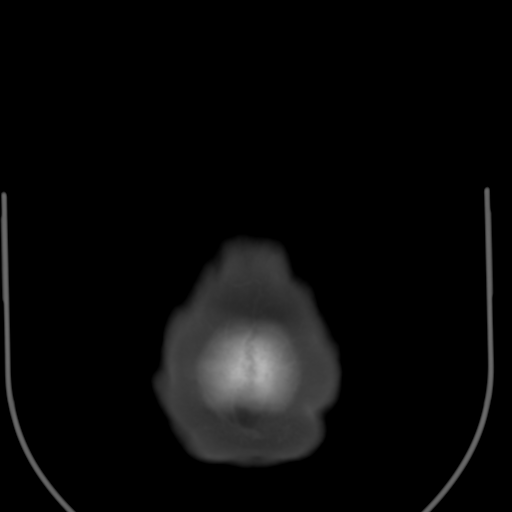

[13 of 30 positions shown; findings below may reference images not displayed]

FINDINGS: Despite repeating the examination, both sets of images
are mildly motion degraded.  However, there is no evidence of acute
intracranial hemorrhage, mass lesion, brain edema or extra-axial
fluid collection.  The ventricles and subarachnoid spaces are
appropriately sized for age.  There is no CT evidence of acute
cortical infarction.

The visualized paranasal sinuses are clear. The calvarium is
intact.
IMPRESSION: No acute intracranial or calvarial findings.  Study is mildly
motion degraded.

## 2012-05-04 MED ORDER — ONDANSETRON HCL 4 MG PO TABS: 4.0000 mg | ORAL_TABLET | Freq: Four times a day (QID) | ORAL | Status: DC | PRN

## 2012-05-04 MED ORDER — INSULIN ASPART 100 UNIT/ML ~~LOC~~ SOLN
0.0000 [IU] | Freq: Three times a day (TID) | SUBCUTANEOUS | Status: DC
  Administered 2012-05-05: 2 [IU] via SUBCUTANEOUS

## 2012-05-04 MED ORDER — SITAGLIPTIN PHOS-METFORMIN HCL 50-1000 MG PO TABS: 1.0000 | ORAL_TABLET | Freq: Two times a day (BID) | ORAL | Status: DC

## 2012-05-04 MED ORDER — ZOLPIDEM TARTRATE 5 MG PO TABS: 5.0000 mg | ORAL_TABLET | Freq: Every evening | ORAL | Status: DC | PRN

## 2012-05-04 MED ORDER — ONDANSETRON HCL 4 MG/2ML IJ SOLN: 4.0000 mg | Freq: Four times a day (QID) | INTRAMUSCULAR | Status: DC | PRN

## 2012-05-04 MED ORDER — HYDROCODONE-ACETAMINOPHEN 5-325 MG PO TABS
1.0000 | ORAL_TABLET | ORAL | Status: DC | PRN
  Administered 2012-05-05 – 2012-05-06 (×3): 2 via ORAL
  Filled 2012-05-04 (×3): qty 2

## 2012-05-04 MED ORDER — ACETAMINOPHEN 325 MG PO TABS: 650.0000 mg | ORAL_TABLET | Freq: Four times a day (QID) | ORAL | Status: DC | PRN

## 2012-05-04 MED ORDER — LINAGLIPTIN 5 MG PO TABS
5.0000 mg | ORAL_TABLET | Freq: Every day | ORAL | Status: DC
  Administered 2012-05-05 – 2012-05-06 (×2): 5 mg via ORAL
  Filled 2012-05-04 (×2): qty 1

## 2012-05-04 MED ORDER — SODIUM CHLORIDE 0.9 % IJ SOLN
3.0000 mL | Freq: Two times a day (BID) | INTRAMUSCULAR | Status: DC
  Administered 2012-05-05: 3 mL via INTRAVENOUS

## 2012-05-04 MED ORDER — ACETAMINOPHEN 650 MG RE SUPP: 650.0000 mg | Freq: Four times a day (QID) | RECTAL | Status: DC | PRN

## 2012-05-04 MED ORDER — ALUM & MAG HYDROXIDE-SIMETH 200-200-20 MG/5ML PO SUSP: 30.0000 mL | Freq: Four times a day (QID) | ORAL | Status: DC | PRN

## 2012-05-04 MED ORDER — LISINOPRIL 10 MG PO TABS
10.0000 mg | ORAL_TABLET | Freq: Every day | ORAL | Status: DC
  Administered 2012-05-05 – 2012-05-06 (×2): 10 mg via ORAL
  Filled 2012-05-04 (×2): qty 1

## 2012-05-04 MED ORDER — GLIPIZIDE 10 MG PO TABS
10.0000 mg | ORAL_TABLET | Freq: Two times a day (BID) | ORAL | Status: DC
Start: 2012-05-05 — End: 2012-05-06
  Administered 2012-05-05 – 2012-05-06 (×3): 10 mg via ORAL
  Filled 2012-05-04 (×5): qty 1

## 2012-05-04 MED ORDER — ENOXAPARIN SODIUM 40 MG/0.4ML ~~LOC~~ SOLN
40.0000 mg | SUBCUTANEOUS | Status: DC
  Administered 2012-05-04 – 2012-05-05 (×2): 40 mg via SUBCUTANEOUS
  Filled 2012-05-04 (×3): qty 0.4

## 2012-05-04 MED ORDER — INSULIN ASPART 100 UNIT/ML ~~LOC~~ SOLN: 0.0000 [IU] | Freq: Every day | SUBCUTANEOUS | Status: DC

## 2012-05-04 MED ORDER — SODIUM CHLORIDE 0.9 % IV SOLN
Freq: Once | INTRAVENOUS | Status: AC
  Administered 2012-05-04: 14:00:00 via INTRAVENOUS

## 2012-05-04 MED ORDER — POTASSIUM CHLORIDE IN NACL 20-0.9 MEQ/L-% IV SOLN
INTRAVENOUS | Status: AC
  Administered 2012-05-04: 22:00:00 via INTRAVENOUS
  Filled 2012-05-04 (×2): qty 1000

## 2012-05-04 MED ORDER — METFORMIN HCL 500 MG PO TABS
1000.0000 mg | ORAL_TABLET | Freq: Two times a day (BID) | ORAL | Status: DC
  Administered 2012-05-05 – 2012-05-06 (×3): 1000 mg via ORAL
  Filled 2012-05-04 (×5): qty 2

## 2012-05-04 MED ORDER — AMLODIPINE BESYLATE 5 MG PO TABS
5.0000 mg | ORAL_TABLET | Freq: Every day | ORAL | Status: DC
  Administered 2012-05-05 – 2012-05-06 (×2): 5 mg via ORAL
  Filled 2012-05-04 (×2): qty 1

## 2012-05-04 NOTE — ED Notes (Addendum)
Pt c/o syncopal episode while on toilet today.  Reports he has been feeling weak since last pm- diarrhea x 3 since last pm.  Was alone when this occurred, unsure how long he was out; woke up on bathroom floor. No apparent injuries.

## 2012-05-04 NOTE — ED Provider Notes (Signed)
History     CSN: [id number]  Arrival date & time 05/04/12  1142   First MD Initiated Contact with Patient 05/04/12 1215      Chief Complaint  Patient presents with  . Loss of Consciousness    (Consider location/radiation/quality/duration/timing/severity/associated sxs/prior treatment) Patient is a 51 y.o. male presenting with syncope. The history is provided by the patient. No language interpreter was used.  Loss of Consciousness This is a new problem. The current episode started today. Episode frequency: once. The problem has been unchanged. Associated symptoms comments: Diarrhea x 1. Exacerbated by: feels fuzzy. He has tried nothing for the symptoms. The treatment provided moderate relief.    Past Medical History  Diagnosis Date  . Hypertension   . Diabetes mellitus     poorly controlled by his report  . IBS (irritable bowel syndrome)   . History of narcotic addiction     past history of back pain  . Hypercholesteremia   . Testosterone deficiency   . Depression   . Obesity     Max weight was 390  . CAD (coronary artery disease)  cardiac cath with moderate disease in a septal branch of the ramus intermedius 04/01/2012    Past Surgical History  Procedure Date  . Cardiac catheterization     No family history on file.  History  Substance Use Topics  . Smoking status: Never Smoker   . Smokeless tobacco: Not on file  . Alcohol Use: No      Review of Systems  Cardiovascular: Positive for syncope.  Gastrointestinal: Positive for diarrhea.  Psychiatric/Behavioral: Positive for decreased concentration.  All other systems reviewed and are negative.    Allergies  Review of patient's allergies indicates no known allergies.  Home Medications   Current Outpatient Rx  Name Route Sig Dispense Refill  . AMLODIPINE BESYLATE 5 MG PO TABS Oral Take 5 mg by mouth daily.    [name] Kitchen CITALOPRAM HYDROBROMIDE 20 MG PO TABS Oral Take 1 tablet (20 mg total) by mouth daily. 30 tablet  1  . GLIPIZIDE 10 MG PO TABS Oral Take 1 tablet (10 mg total) by mouth 2 (two) times daily before a meal. 60 tablet 1  . LISINOPRIL 10 MG PO TABS Oral Take 10 mg by mouth daily.    [name] Kitchen PRAVASTATIN SODIUM 10 MG PO TABS Oral Take 10 mg by mouth daily.    [name] Kitchen SITAGLIPTIN-METFORMIN HCL 50-1000 MG PO TABS Oral Take 1 tablet by mouth 2 (two) times daily with a meal.      BP 138/83  Pulse 84  Temp 97.4 F (36.3 C) (Oral)  Resp 16  SpO2 100%  Physical Exam  Nursing note and vitals reviewed. Constitutional: He is oriented to person, place, and time. He appears well-developed and well-nourished.  HENT:  Head: Normocephalic and atraumatic.  Right Ear: External ear normal.  Mouth/Throat: Oropharynx is clear and moist.  Eyes: Conjunctivae normal and EOM are normal. Pupils are equal, round, and reactive to light.  Neck: Normal range of motion. Neck supple.  Cardiovascular: Normal rate and normal heart sounds.   Pulmonary/Chest: Effort normal and breath sounds normal.  Abdominal: Soft. Bowel sounds are normal.  Musculoskeletal: Normal range of motion.  Neurological: He is alert and oriented to person, place, and time.  Skin: Skin is warm and dry.    ED Course  Procedures (including critical care time)  Labs Reviewed - No data to display No results found.   1. Syncope  MDM  I spoke with Dr. Malachi Bonds  [facility] who will admit. .   Pt to telemetry        Lonia Skinner [place], [place] 05/04/12 1539  Lonia Skinner [place], [place] 05/04/12 1541  Lonia Skinner [place], [place] 05/04/12 1541

## 2012-05-04 NOTE — H&P (Signed)
PCP:   None   Chief Complaint:  Syncope  HPI: [name] with a recent hospitalization resulting in a negative left heart cath. He states last night he had diarrhea. This morning at 2 more episodes. He went to church but was just not feeling good. He became nauseous and went home. At home he went to the bathroom had another episode of liquid diarrhea, on standing he passed out. Prior to passing out he states he had palpitations and heavy breathing. The patient does admit to multiple life changes which has resulted in an increase in frequency of panic attacks. He states he had one last night as well. He was placed on Celexa which he states has not helped. He states the only medication that has worked in the past is Xanax. He believes he may have been  having a panic attack prior to this episode. Currently during my interview patient is alert, oriented, calm, no issues. He states he's not had any further diarrhea. He was transferred here from [facility].  Review of Systems:  The patient denies anorexia, fever, weight loss,, vision loss, decreased hearing, hoarseness, chest pain, dyspnea on exertion, peripheral edema, balance deficits, hemoptysis, abdominal pain, melena, hematochezia, severe indigestion/heartburn, hematuria, incontinence, genital sores, muscle weakness, suspicious skin lesions, transient blindness, difficulty walking, depression, unusual weight change, abnormal bleeding, enlarged lymph nodes, angioedema, and breast masses.  Past Medical History: Past Medical History  Diagnosis Date  . Hypertension   . Diabetes mellitus     poorly controlled by his report  . IBS (irritable bowel syndrome)   . History of narcotic addiction     past history of back pain  . Hypercholesteremia   . Testosterone deficiency   . Depression   . Obesity     Max weight was 390  . CAD (coronary artery disease)  cardiac cath with moderate disease in a septal branch of the ramus  intermedius 04/01/2012  . Panic attacks   . OSA on CPAP    Past Surgical History  Procedure Date  . Cardiac catheterization     Medications: Prior to Admission medications   Medication Sig Start Date End Date Taking? Authorizing Provider  acetaminophen (TYLENOL) 500 MG tablet Take 1,000 mg by mouth every 6 (six) hours as needed. For headache.   Yes Historical Provider, MD  amLODipine (NORVASC) 5 MG tablet Take 5 mg by mouth daily.   Yes Historical Provider, MD  glipiZIDE (GLUCOTROL) 10 MG tablet Take 1 tablet (10 mg total) by mouth 2 (two) times daily before a meal. 04/01/12 04/01/13 Yes Calvert Cantor, MD  lisinopril (PRINIVIL,ZESTRIL) 10 MG tablet Take 10 mg by mouth daily.   Yes Historical Provider, MD  Loratadine-Pseudoephedrine (ALLERGY RELIEF/NASAL DECONGEST PO) Take 1 puff by mouth daily as needed. For nasal and allergy relief   Yes Historical Provider, MD  pravastatin (PRAVACHOL) 10 MG tablet Take 10 mg by mouth daily.   Yes Historical Provider, MD  sitaGLIPtan-metformin (JANUMET) 50-1000 MG per tablet Take 1 tablet by mouth 2 (two) times daily with a meal.   Yes Historical Provider, MD    Allergies:  No Known Allergies  Social History:  reports that he has never smoked. He does not have any smokeless tobacco history on file. He reports that he does not drink alcohol or use illicit drugs.  Family History: Family History  Problem Relation Age of Onset  . Diabetes type II    . Hypertension      Physical  Exam: Filed Vitals:   05/04/12 1407 05/04/12 1620 05/04/12 1827 05/04/12 2009  BP: 141/78 138/81 154/83 154/99  Pulse:  81 75 71  Temp:   97.7 F (36.5 C) 98.2 F (36.8 C)  TempSrc:   Oral Oral  Resp: 16 16 16 18   Height:   5\' 11"  (1.803 m)   Weight:   145.8 kg (321 lb 6.9 oz)   SpO2: 97% 100% 98% 99%    General:  Alert and oriented times three, obese, no acute distress Eyes: PERRLA, pink conjunctiva, no scleral icterus ENT: Moist oral mucosa, neck supple, no  thyromegaly Lungs: clear to ascultation, no wheeze, no crackles, no use of accessory muscles Cardiovascular: regular rate and rhythm, no regurgitation, no gallops, no murmurs. No carotid bruits, no JVD Abdomen: soft, positive BS, non-tender, non-distended, no organomegaly, not an acute abdomen GU: not examined Neuro: CN II - XII grossly intact, sensation intact Musculoskeletal: strength 5/5 all extremities, no clubbing, cyanosis or edema Skin: no rash, no subcutaneous crepitation, no decubitus Psych: appropriate patient   Labs on Admission:   Basename 05/04/12 1200  NA 136  K 3.7  CL 99  CO2 25  GLUCOSE 223*  BUN 10  CREATININE 0.70  CALCIUM 9.2  MG --  PHOS --    Basename 05/04/12 1200  AST 16  ALT 24  ALKPHOS 57  BILITOT 0.3  PROT 6.7  ALBUMIN 3.5   No results found for this basename: LIPASE:2,AMYLASE:2 in the last 72 hours  Basename 05/04/12 1200  WBC 6.5  NEUTROABS 4.4  HGB 14.0  HCT 40.9  MCV 87.6  PLT 283    Basename 05/04/12 1200  CKTOTAL --  CKMB --  CKMBINDEX --  TROPONINI <0.30   No components found with this basename: POCBNP:3 No results found for this basename: DDIMER:2 in the last 72 hours No results found for this basename: HGBA1C:2 in the last 72 hours No results found for this basename: CHOL:2,HDL:2,LDLCALC:2,TRIG:2,CHOLHDL:2,LDLDIRECT:2 in the last 72 hours No results found for this basename: TSH,T4TOTAL,FREET3,T3FREE,THYROIDAB in the last 72 hours No results found for this basename: VITAMINB12:2,FOLATE:2,FERRITIN:2,TIBC:2,IRON:2,RETICCTPCT:2 in the last 72 hours  Micro Results: No results found for this or any previous visit (from the past 240 hour(s)).  Results for Tyler Brewer (MRN [id number]) as of 05/04/2012 20:28  Ref. Range 05/04/2012 15:31  Color, Urine Latest Range: YELLOW  YELLOW  APPearance Latest Range: CLEAR  CLEAR  Specific Gravity, Urine Latest Range: 1.005-1.030  1.021  pH Latest Range: 5.0-8.0  6.0  Glucose Latest  Range: NEGATIVE mg/dL 956 (A)  Bilirubin Urine Latest Range: NEGATIVE  NEGATIVE  Ketones, ur Latest Range: NEGATIVE mg/dL NEGATIVE  Protein Latest Range: NEGATIVE mg/dL NEGATIVE  Urobilinogen, UA Latest Range: 0.0-1.0 mg/dL 0.2  Nitrite Latest Range: NEGATIVE  NEGATIVE  Leukocytes, UA Latest Range: NEGATIVE  NEGATIVE  Hgb urine dipstick Latest Range: NEGATIVE  TRACE (A)  WBC, UA Latest Range: <3 WBC/hpf 0-2  RBC / HPF Latest Range: <3 RBC/hpf 7-10  Squamous Epithelial / LPF Latest Range: RARE  RARE   Radiological Exams on Admission: Ct Head Wo Contrast  05/04/2012  *RADIOLOGY REPORT*  Clinical Data: Syncopal episode.  Weakness with diarrhea.  History of hypertension and diabetes.  CT HEAD WITHOUT CONTRAST  Technique:  Contiguous axial images were obtained from the base of the skull through the vertex without contrast.  Comparison: None.  Findings: Despite repeating the examination, both sets of images are mildly motion degraded.  However, there is no evidence of  acute intracranial hemorrhage, mass lesion, brain edema or extra-axial fluid collection.  The ventricles and subarachnoid spaces are appropriately sized for age.  There is no CT evidence of acute cortical infarction.  The visualized paranasal sinuses are clear. The calvarium is intact.  IMPRESSION: No acute intracranial or calvarial findings.  Study is mildly motion degraded.   Original Report Authenticated By: Gerrianne Scale, M.D.     An EKG: Normal sinus rhythm  Assessment/Plan Present on Admission:   syncope Likely a vasovagal Monitor in telemetry, IV fluid hydration Monitor for resolution of diarrhea. We'll check C. difficile toxins  Panic attacks DC Celexa and start Lexapro Diarrhea Appears resolved. Monitor him diabetes mellitus Objective sleep apnea CPAP Hypertension Anxiety and depression Obesity ADD Chronic pain Hyperlipidemia DM Narcotic abuse and dependence Stable resume home medications ADA diet, sliding  scale insulin coverage   Full code DVT prophylaxis      Tyler Brewer 05/04/2012, 8:28 PM

## 2012-05-04 NOTE — ED Notes (Signed)
Attempted to call report to unit 2000; nurse unavailable; my number left for nurse to call back

## 2012-05-05 DIAGNOSIS — E1149 Type 2 diabetes mellitus with other diabetic neurological complication: Secondary | ICD-10-CM

## 2012-05-05 DIAGNOSIS — R197 Diarrhea, unspecified: Secondary | ICD-10-CM | POA: Diagnosis present

## 2012-05-05 DIAGNOSIS — E1142 Type 2 diabetes mellitus with diabetic polyneuropathy: Secondary | ICD-10-CM

## 2012-05-05 DIAGNOSIS — G4733 Obstructive sleep apnea (adult) (pediatric): Secondary | ICD-10-CM | POA: Diagnosis present

## 2012-05-05 DIAGNOSIS — R55 Syncope and collapse: Secondary | ICD-10-CM | POA: Diagnosis present

## 2012-05-05 DIAGNOSIS — F411 Generalized anxiety disorder: Secondary | ICD-10-CM

## 2012-05-05 DIAGNOSIS — F410 Panic disorder [episodic paroxysmal anxiety] without agoraphobia: Secondary | ICD-10-CM | POA: Diagnosis present

## 2012-05-05 DIAGNOSIS — I10 Essential (primary) hypertension: Secondary | ICD-10-CM

## 2012-05-05 DIAGNOSIS — E785 Hyperlipidemia, unspecified: Secondary | ICD-10-CM | POA: Diagnosis present

## 2012-05-05 DIAGNOSIS — E119 Type 2 diabetes mellitus without complications: Secondary | ICD-10-CM | POA: Diagnosis present

## 2012-05-05 LAB — GLUCOSE, CAPILLARY
Glucose-Capillary: 135 mg/dL — ABNORMAL HIGH (ref 70–99)
Glucose-Capillary: 87 mg/dL (ref 70–99)
Glucose-Capillary: 131 mg/dL — ABNORMAL HIGH (ref 70–99)
Glucose-Capillary: 82 mg/dL (ref 70–99)

## 2012-05-05 LAB — CBC
HCT: 40.4 % (ref 39.0–52.0)
Hemoglobin: 13.6 g/dL (ref 13.0–17.0)
MCH: 29.7 pg (ref 26.0–34.0)
MCHC: 33.7 g/dL (ref 30.0–36.0)
MCV: 88.2 fL (ref 78.0–100.0)
Platelets: 256 10*3/uL (ref 150–400)
RBC: 4.58 MIL/uL (ref 4.22–5.81)
RDW: 13.2 % (ref 11.5–15.5)
WBC: 6.7 10*3/uL (ref 4.0–10.5)

## 2012-05-05 LAB — BASIC METABOLIC PANEL
BUN: 10 mg/dL (ref 6–23)
CO2: 24 mEq/L (ref 19–32)
Calcium: 9.1 mg/dL (ref 8.4–10.5)
Chloride: 103 mEq/L (ref 96–112)
Creatinine, Ser: 0.66 mg/dL (ref 0.50–1.35)
GFR calc Af Amer: >90 mL/min (ref >90)
GFR calc non Af Amer: >90 mL/min (ref >90)
Glucose, Bld: 148 mg/dL — ABNORMAL HIGH (ref 70–99)
Potassium: 3.9 mEq/L (ref 3.5–5.1)
Sodium: 137 mEq/L (ref 135–145)

## 2012-05-05 LAB — D-DIMER, QUANTITATIVE: D-Dimer, Quant: 0.28 ug/mL-FEU (ref 0.00–0.48)

## 2012-05-05 MED ORDER — DULOXETINE HCL 30 MG PO CPEP
30.0000 mg | ORAL_CAPSULE | Freq: Every day | ORAL | Status: DC
  Administered 2012-05-06: 30 mg via ORAL
  Filled 2012-05-05 (×2): qty 1

## 2012-05-05 MED ORDER — CLONAZEPAM 0.5 MG PO TABS
0.5000 mg | ORAL_TABLET | Freq: Two times a day (BID) | ORAL | Status: DC
  Administered 2012-05-05 – 2012-05-06 (×2): 0.5 mg via ORAL
  Filled 2012-05-05 (×2): qty 1

## 2012-05-05 NOTE — Progress Notes (Signed)
Orhtostatics completed, pt tolerated well, no c/o dizziness or lightheadedness.  Vitals documented at 1100.

## 2012-05-05 NOTE — Progress Notes (Signed)
[facility] PROGRESS NOTE  Tyler Brewer MVH:[id number] DOB: 1960-10-29 DOA: 05/04/2012 PCP: Provider Not In System  Assessment/Plan: Active Problems:   1. Syncope:   According to patient, he passed out about 5 minutes after a diarrheal episode (he fell off the toilet seat while reading a paper, and came to, about 10 minutes later), and this was preceded by palpitations and SOB. Differential diagnostic considerations include panic attack, vasovagal episode, orthostasis from diarrhea-induced dehydration and volume depletion, paroxysmal arrhythmia, as well as a possible PE. Fortunately, D-Dimer is negative at 0.28. Managing with iv fluids, checking orthostatics. Head CT scan is unremarkable for acute findings, 12-lead EKG shows no acute ischemic changes, no arrhythmias have been recorded on telemetry, and cardiac enzymes are negative so far. Patient was able to ambulate without dizziness this AM. Will discontinue iv fluids later today.  2. Anxiety and depression/Panic attacks:  A panic attack may have been the etiology for his syncopal episode. Patient feels that his panic attacks have occurred more frequently lately, and although he was placed on Celexa for this, he feels that it is not as effective as Xanax had been. He has been commenced on Lexapro by admitting MD. I feel a psychiatric consultation is warranted, so have request one, accordingly.  Patient has been under multiple stressors this year.  3. Diarrhea: This commenced on 05/03/12, and appears to have been self-limited and resolved. Probably viral etiology. Will do stool studies, if recurs. No stools in the past 24 hours.  4. Obesity/bstructive sleep apnea:  Patient utilizes nocturnal CPAP, which has been continued. Stable.  5. Hypertension: Controlled on ACE-i. 6. ADD: Stable/Not problematic.  7. Chronic pain: Controlled.   8. Hyperlipidemia: On Statin. 9. DM: This is type 2, and managed with oral hypoglycemics. CBGs are  reasonable. On diet/SSI in addition.  10. Narcotic abuse and dependence: Patient has a known previous history of narcotic dependence. UDS is negative.    Code Status: Full Code. Family Communication:  Disposition Plan: Possible discharge on 05/06/12. . May need outpatient event monitor.    Brief narrative: 51 year old gentleman with history of HTN, DM-2, IBS, dyslipidemia, depression, anxiety/panic attacks, narcotic dependence, morbid obesity, OSA on CPAP, CAD, s/p cardiac cath 04/01/12, with moderate disease in a septal branch of the ramus intermedius. According to patient, he had diarrhea in the night of 05/03/12, followed by 2 more episodes in AM of 05/04/12. He went to church afterwards, but felt unwell, became nauseated and went home. At home he went to the bathroom, had another episode of liquid diarrhea, had palpitations, heavy breathing and passed out. The patient does admit to multiple life changes which has resulted in an increase in frequency of panic attacks. He states he had one last night as well. He was placed on Celexa which he states has not helped. He states the only medication that has worked in the past, is Xanax. He believes he may have been having a panic attack prior to this episode. He was transferred to [facility] from [facility].   Consultants:  Psychiatrist.   Procedures:  Head CT scan.  Antibiotics:  N/A.   HPI/Subjective: Feels much better, but a bit weak. Ambulated without dizziness.  Objective: Vital signs in last 24 hours: Temp:  [97.4 F (36.3 C)-98.2 F (36.8 C)] 97.6 F (36.4 C) (09/16 0502) Pulse Rate:  [66-84] 66  (09/16 0502) Resp:  [14-18] 18  (09/16 0502) BP: (129-154)/(78-99) 129/88 mmHg (09/16 0502) SpO2:  [97 %-100 %] 99 % (  09/16 0502) Weight:  [145.8 kg (321 lb 6.9 oz)] 145.8 kg (321 lb 6.9 oz) (09/15 1827) Weight change:  Last BM Date: 05/04/12  Intake/Output from previous day:       Physical Exam: General: Comfortable,  alert, communicative, fully oriented, not short of breath at rest.  HEENT:  No clinical pallor, no jaundice, no conjunctival injection or discharge. NECK:  Supple, JVP not seen, no carotid bruits, no palpable lymphadenopathy, no palpable goiter. CHEST:  Clinically clear to auscultation, no wheezes, no crackles. HEART:  Sounds 1 and 2 heard, normal, regular, no murmurs. ABDOMEN:  Morbidly obese, soft, non-tender, no palpable organomegaly, no palpable masses, normal bowel sounds. GENITALIA:  Not examined. LOWER EXTREMITIES:  No pitting edema, palpable peripheral pulses. MUSCULOSKELETAL SYSTEM:  Unremarkable.  CENTRAL NERVOUS SYSTEM:  No focal neurologic deficit on gross examination.  Lab Results:  Basename 05/05/12 0505 05/04/12 2153  WBC 6.7 7.2  HGB 13.6 13.8  HCT 40.4 40.1  PLT 256 264    Basename 05/05/12 0505 05/04/12 2153 05/04/12 1200  NA 137 -- 136  K 3.9 -- 3.7  CL 103 -- 99  CO2 24 -- 25  GLUCOSE 148* -- 223*  BUN 10 -- 10  CREATININE 0.66 0.65 --  CALCIUM 9.1 -- 9.2   No results found for this or any previous visit (from the past 240 hour(s)).   Studies/Results: Ct Head Wo Contrast  05/04/2012  *RADIOLOGY REPORT*  Clinical Data: Syncopal episode.  Weakness with diarrhea.  History of hypertension and diabetes.  CT HEAD WITHOUT CONTRAST  Technique:  Contiguous axial images were obtained from the base of the skull through the vertex without contrast.  Comparison: None.  Findings: Despite repeating the examination, both sets of images are mildly motion degraded.  However, there is no evidence of acute intracranial hemorrhage, mass lesion, brain edema or extra-axial fluid collection.  The ventricles and subarachnoid spaces are appropriately sized for age.  There is no CT evidence of acute cortical infarction.  The visualized paranasal sinuses are clear. The calvarium is intact.  IMPRESSION: No acute intracranial or calvarial findings.  Study is mildly motion degraded.   Original  Report Authenticated By: Gerrianne Scale, M.D.     Medications: Scheduled Meds:   . sodium chloride   Intravenous Once  . amLODipine  5 mg Oral Daily  . enoxaparin (LOVENOX) injection  40 mg Subcutaneous Q24H  . glipiZIDE  10 mg Oral BID AC  . insulin aspart  0-15 Units Subcutaneous TID WC  . insulin aspart  0-5 Units Subcutaneous QHS  . linagliptin  5 mg Oral Daily  . lisinopril  10 mg Oral Daily  . metFORMIN  1,000 mg Oral BID WC  . sodium chloride  3 mL Intravenous Q12H  . DISCONTD: sitaGLIPtan-metformin  1 tablet Oral BID WC   Continuous Infusions:   . 0.9 % NaCl with KCl 20 mEq / L 75 mL/hr at 05/04/12 2216   PRN Meds:.acetaminophen, acetaminophen, alum & mag hydroxide-simeth, HYDROcodone-acetaminophen, ondansetron (ZOFRAN) IV, ondansetron, zolpidem    LOS: 1 day   Tyler Brewer,CHRISTOPHER  [facility] Pager [phone]. If 8PM-8AM, please contact night-coverage at [email or web address], password [facility] 05/05/2012, 8:30 AM  LOS: 1 day

## 2012-05-05 NOTE — Consult Note (Signed)
Patient Identification:  Tyler Brewer Date of Evaluation:  05/05/2012 Reason for Consult:   Panic Attack  Medications  Referring Provider: Dr. Brien Few  History of Present Illness: Pt states he had bouts of diarrhea and while in bathroom, he fainted with noticeable shortness of breath and palpitations.  He had been given Xanax for Panic Attacks.  He had taken Celexa prior to this episode.  He says he lives with his mother  Past Psychiatric History: Past panic and depression.  Xanax has helped in the past    Past Medical History:     Past Medical History  Diagnosis Date  . Hypertension   . Diabetes mellitus     poorly controlled by his report  . IBS (irritable bowel syndrome)   . History of narcotic addiction     past history of back pain  . Hypercholesteremia   . Testosterone deficiency   . Depression   . Obesity     Max weight was 390  . CAD (coronary artery disease)  cardiac cath with moderate disease in a septal branch of the ramus intermedius 04/01/2012  . Panic attacks   . OSA on CPAP        Past Surgical History  Procedure Date  . Cardiac catheterization     Allergies: No Known Allergies  Current Medications:  Prior to Admission medications   Medication Sig Start Date End Date Taking? Authorizing Provider  acetaminophen (TYLENOL) 500 MG tablet Take 1,000 mg by mouth every 6 (six) hours as needed. For headache.   Yes Historical Provider, MD  amLODipine (NORVASC) 5 MG tablet Take 5 mg by mouth daily.   Yes Historical Provider, MD  glipiZIDE (GLUCOTROL) 10 MG tablet Take 1 tablet (10 mg total) by mouth 2 (two) times daily before a meal. 04/01/12 04/01/13 Yes Calvert Cantor, MD  lisinopril (PRINIVIL,ZESTRIL) 10 MG tablet Take 10 mg by mouth daily.   Yes Historical Provider, MD  Loratadine-Pseudoephedrine (ALLERGY RELIEF/NASAL DECONGEST PO) Take 1 puff by mouth daily as needed. For nasal and allergy relief   Yes Historical Provider, MD  pravastatin (PRAVACHOL) 10 MG tablet Take  10 mg by mouth daily.   Yes Historical Provider, MD  sitaGLIPtan-metformin (JANUMET) 50-1000 MG per tablet Take 1 tablet by mouth 2 (two) times daily with a meal.   Yes Historical Provider, MD    Social History:    reports that he has never smoked. He does not have any smokeless tobacco history on file. He reports that he does not drink alcohol or use illicit drugs.   Family History:    Family History  Problem Relation Age of Onset  . Diabetes type II    . Hypertension      Mental Status Examination/Evaluation: Objective:  Appearance: Casual and obese  Psychomotor Activity:  Normal  Eye Contact::  Good  Speech:  Clear and Coherent  Volume:  Normal  Mood:  Anxious and Dysphoric  Affect:  Congruent  Thought Process:  Coherent, Relevant and Intact  Orientation:  Full  Thought Content:  Organznized wanting to get over his panic  Suicidal Thoughts:  No  Homicidal Thoughts:  No  Judgement:  Good  Insight:  Fair    DIAGNOSIS:   AXIS I   Panic Attacks,   AXIS II  Deffered  AXIS III See medical notes.  AXIS IV other psychosocial or environmental problems and problems related to social environment  AXIS V 51-60 moderate symptoms   Assessment/Plan:  Discussed with Dr.  Brien Few  Psych SW Pt is lying in bed calm but explains he had had panic attack and was in the bathroom suffering from bouts of diarrhea when he passed out.   He has taken other medications but likes Xanax the best.  He has been given other medications and some SNRIs in combination with a small dose benzodiazepine is best IF pt declines to engage in cognitive behavioral therapy to learn how to diminish panic attacks.    He denies other psychiatric problems, SI, HI psychotic symptoms.  RECOMMENDATION:  1.  Encourage pt to see outpatient psychiatrist. 2.  Pt is cognitively intact. 3.  Encourage outpatient therapy for anxiety and panic 4.  Suggest Cymbalta 30 mg which may be increased to 60 mg daily for anxiety, depression,  Panic 5.  Suggest small dose Klonopin 0.5 mg BID [scheduled not prn] to address panic sx.  7.  No further psychiatric needs identified.   Xolani Degracia J. Ferol Luz, MD Psychiatrist  05/05/2012 11:17 AM

## 2012-05-05 NOTE — Progress Notes (Signed)
Pt started on cpap this pm. Pt stated settings at home is a pressure of 14 . Will continue to monitor>

## 2012-05-05 NOTE — Care Management Note (Unsigned)
    Page 1 of 1   05/05/2012     11:01:15 AM   CARE MANAGEMENT NOTE 05/05/2012  Patient:  Tyler Brewer, Tyler Brewer   Account Number:  [id number]  Date Initiated:  05/05/2012  Documentation initiated by:  SIMMONS,Kweku Stankey  Subjective/Objective Assessment:   ADMITTED WITH SYNCOPE; LIVES AT HOME WITH MOTHER; WAS IPTA.     Action/Plan:   DISCHARGE PLANNING DISCUSSED AT BEDSIDE.   Anticipated DC Date:  05/06/2012   Anticipated DC Plan:  HOME/SELF CARE      DC Planning Services  CM consult      Choice offered to / List presented to:             Status of service:  In process, will continue to follow Medicare Important Message given?   (If response is "NO", the following Medicare IM given date fields will be blank) Date Medicare IM given:   Date Additional Medicare IM given:    Discharge Disposition:    Per UR Regulation:  Reviewed for med. necessity/level of care/duration of stay  If discussed at Long Length of Stay Meetings, dates discussed:    Comments:  05/05/12  1100  Cylah Fannin SIMMONS RN, BSN [phone] NCM WILL FOLLOW.

## 2012-05-05 NOTE — Discharge Summary (Addendum)
Physician Discharge Summary  Tyler Brewer ZOX:[id number] DOB: 1961-07-11 DOA: 05/04/2012  PCP: Provider Not In System  Admit date: 05/04/2012 Discharge date: 05/06/2012  Recommendations for Outpatient Follow-up:  1. Follow up with primary MD. 2. Appointment made with Dr Lewayne Bunting on 06/09/2012 at 10: 00 AM for EP consultation and to follow up on event monitor results. The office will contact patient for appointment to have event monitor placed.    Discharge Diagnoses:  Active Problems:  Syncope  Diarrhea  Panic attacks  OSA (obstructive sleep apnea)  HTN (hypertension)  Diabetes mellitus type II  Dyslipidemia   Discharge Condition: Satisfactory.   Diet recommendation:  Heart-Healthy/Carbohydrate-Modified.   Filed Weights   05/04/12 1827  Weight: 145.8 kg (321 lb 6.9 oz)    History of present illness:  51 year old gentleman with history of HTN, DM-2, IBS, dyslipidemia, depression, anxiety/panic attacks, narcotic dependence, morbid obesity, OSA on CPAP, CAD, s/p cardiac cath 04/01/12, with moderate disease in a septal branch of the ramus intermedius. According to patient, he had diarrhea in the night of 05/03/12, followed by 2 more episodes in AM of 05/04/12. He went to church afterwards, but felt unwell, became nauseated and went home. At home he went to the bathroom, had another episode of liquid diarrhea, had palpitations, heavy breathing and passed out. The patient does admit to multiple life changes which has resulted in an increase in frequency of panic attacks. He states he had one last night as well. He was placed on Celexa which he states has not helped. He states the only medication that has worked in the past, is Xanax. He believes he may have been having a panic attack prior to this episode. He was transferred to [facility] from [facility].   Hospital Course:  1. Syncope:  According to patient, he passed out about 5 minutes after a diarrheal episode (he fell off the  toilet seat while reading a paper, and came to, about 10 minutes later), and this was preceded by palpitations and SOB. Differential diagnostic considerations include panic attack, vasovagal episode, orthostasis from diarrhea-induced dehydration and volume depletion, paroxysmal arrhythmia, as well as a possible PE. Fortunately, D-Dimer was negative at 0.28. Patient was managed with iv fluids, orthostatics wre checked, and failed to reveal postural hypotension. Head CT scan was unremarkable for acute findings, 12-lead EKG showed no acute ischemic changes, no arrhythmias were recorded on telemetry, and cardiac enzymes remained unelevated. Patient was able to ambulate without dizziness as of 05/05/12. IV fluids were discontinued on that date, without deleterious effects. Of note, cardiac catheterization performed on 03/31/12, by Dr Bryan Lemma, revealed angiographically normal coronary arteries, moderate disease in a septal small branch of the ramus intermedius, normal left ventricular end-diastolic pressure, EF 60%-65% and no regional wall motion abnormalities. Event monitor requested through [facility] office. Appointment made with Dr Lewayne Bunting on 06/09/2012 at 10:00 AM for EP consultation and to follow up on event monitor results. The office will contact patient for appointment to have event monitor placed.  2. Anxiety and depression/Panic attacks:  A panic attack may have been the etiology for his syncopal episode. Patient feels that his panic attacks have occurred more frequently lately, and although he was placed on Celexa for this, but feels that it is not as effective as Xanax had been. Patient has been under multiple stressors this year. Psychiatric consultation was provided by Dr Mickeal Skinner, who has recommended encourage outpatient therapy for anxiety and panic, Cymbalta 30 mg daily,  which may be increased to 60 mg daily for anxiety, depression, and panic, as well as a small dose of Klonopin 0.5 mg  BID (scheduled, not prn) to address panic symptoms.  3. Diarrhea: This commenced on 05/03/12, and appears to have been self-limited and resolved. Probably viral etiology. As of 05/05/12, patient and no further diarrheal stools. He did complain of sharp right flank pain overnight in AM of 05/06/12, but abdominal exam revealed only discomfort to deep palpation, and was otherwise benign, without guarding or rebound tenderness. Wcc was normal. 4. Obesity/bstructive sleep apnea: Patient utilizes nocturnal CPAP, which was continued. Stable.  5. Hypertension: Controlled on ACE-i.  6. Chronic pain: Controlled.  7. Hyperlipidemia: On Statin.  8. DM: This is type 2, and was managed with oral hypoglycemics, diet/SSI. CBGs remained reasonable.  9. Narcotic abuse and dependence: Patient has a known previous history of narcotic dependence. This did not prove to be an issue, during this hospitalization. UDS was negative.    Procedures:  See below.   Consultations:  Dr Mickeal Skinner, psychiatrist.   Discharge Exam: Filed Vitals:   05/05/12 1111 05/05/12 1112 05/05/12 2043 05/06/12 0644  BP: 126/86 126/80 132/89 134/77  Pulse: 61 66 71 66  Temp:   97.5 F (36.4 C) 97.6 F (36.4 C)  TempSrc:   Oral Oral  Resp: 19 19 16 18   Height:      Weight:      SpO2: 98% 98% 98% 100%    General: Comfortable, alert, communicative, fully oriented, not short of breath at rest.  HEENT: No clinical pallor, no jaundice, no conjunctival injection or discharge.  NECK: Supple, JVP not seen, no carotid bruits, no palpable lymphadenopathy, no palpable goiter.  CHEST: Clinically clear to auscultation, no wheezes, no crackles.  HEART: Sounds 1 and 2 heard, normal, regular, no murmurs.  ABDOMEN: Morbidly obese, soft, mild-moderate discomfort to deep palpation right flank and RLQ, no guarding or rebound tenderness, no palpable organomegaly, no palpable masses, normal bowel sounds.  GENITALIA: Not examined.  LOWER  EXTREMITIES: No pitting edema, palpable peripheral pulses.  MUSCULOSKELETAL SYSTEM: Unremarkable.  CENTRAL NERVOUS SYSTEM: No focal neurologic deficit on gross examination.  Discharge Instructions      Discharge Orders    Future Appointments: Provider: Department: Dept Phone: Center:   06/09/2012 10:00 AM Marinus Maw, MD [facility] [phone] [facility]       Medication List     As of 05/06/2012 10:59 AM    TAKE these medications         acetaminophen 500 MG tablet   Commonly known as: TYLENOL   Take 1,000 mg by mouth every 6 (six) hours as needed. For headache.      ALLERGY RELIEF/NASAL DECONGEST PO   Take 1 puff by mouth daily as needed. For nasal and allergy relief      amLODipine 5 MG tablet   Commonly known as: NORVASC   Take 5 mg by mouth daily.      clonazePAM 0.5 MG tablet   Commonly known as: KLONOPIN   Take 1 tablet (0.5 mg total) by mouth 2 (two) times daily.      DULoxetine 30 MG capsule   Commonly known as: CYMBALTA   Take 1 capsule (30 mg total) by mouth daily.      glipiZIDE 10 MG tablet   Commonly known as: GLUCOTROL   Take 1 tablet (10 mg total) by mouth 2 (two) times daily before a meal.  lisinopril 10 MG tablet   Commonly known as: PRINIVIL,ZESTRIL   Take 10 mg by mouth daily.      oxycodone 5 MG capsule   Commonly known as: OXY-IR   Take 1 capsule (5 mg total) by mouth every 6 (six) hours as needed.      pravastatin 10 MG tablet   Commonly known as: PRAVACHOL   Take 10 mg by mouth daily.      sitaGLIPtan-metformin 50-1000 MG per tablet   Commonly known as: JANUMET   Take 1 tablet by mouth 2 (two) times daily with a meal.         Follow-up Information    Schedule an appointment as soon as possible for a visit with [facility]. (For additional counseling, and medication )    Contact information:   [address]  [place], [place] [address] [phone]      Follow up with Provider Not In  System. (Follow up with primary MD per prior appointmment, or as needed. )       Follow up with Lewayne Bunting, MD. (Appointment made with Dr Lewayne Bunting on 06/09/2012 at 10: 00 AM for EP consultation and to follow up on event monitor results. The office will contact you for appointment to have event monitor placed. )    Contact information:   [address] [place] [address] [phone]           The results of significant diagnostics from this hospitalization (including imaging, microbiology, ancillary and laboratory) are listed below for reference.    Significant Diagnostic Studies: Ct Head Wo Contrast  05/04/2012  *RADIOLOGY REPORT*  Clinical Data: Syncopal episode.  Weakness with diarrhea.  History of hypertension and diabetes.  CT HEAD WITHOUT CONTRAST  Technique:  Contiguous axial images were obtained from the base of the skull through the vertex without contrast.  Comparison: None.  Findings: Despite repeating the examination, both sets of images are mildly motion degraded.  However, there is no evidence of acute intracranial hemorrhage, mass lesion, brain edema or extra-axial fluid collection.  The ventricles and subarachnoid spaces are appropriately sized for age.  There is no CT evidence of acute cortical infarction.  The visualized paranasal sinuses are clear. The calvarium is intact.  IMPRESSION: No acute intracranial or calvarial findings.  Study is mildly motion degraded.   Original Report Authenticated By: Gerrianne Scale, M.D.     Microbiology: No results found for this or any previous visit (from the past 240 hour(s)).   Labs: Basic Metabolic Panel:  Lab 05/06/12 7425 05/05/12 0505 05/04/12 2153 05/04/12 1200  NA 139 137 -- 136  K 3.7 3.9 -- 3.7  CL 104 103 -- 99  CO2 24 24 -- 25  GLUCOSE 103* 148* -- 223*  BUN 12 10 -- 10  CREATININE 0.62 0.66 0.65 0.70  CALCIUM 9.3 9.1 -- 9.2  MG -- -- -- --  PHOS -- -- -- --   Liver Function  Tests:  Lab 05/04/12 1200  AST 16  ALT 24  ALKPHOS 57  BILITOT 0.3  PROT 6.7  ALBUMIN 3.5   No results found for this basename: LIPASE:5,AMYLASE:5 in the last 168 hours No results found for this basename: AMMONIA:5 in the last 168 hours CBC:  Lab 05/06/12 0445 05/05/12 0505 05/04/12 2153 05/04/12 1200  WBC 6.1 6.7 7.2 6.5  NEUTROABS -- -- -- 4.4  HGB 14.1 13.6 13.8 14.0  HCT 42.0 40.4  40.1 40.9  MCV 88.6 88.2 87.2 87.6  PLT 249 256 264 283   Cardiac Enzymes:  Lab 05/04/12 1200  CKTOTAL --  CKMB --  CKMBINDEX --  TROPONINI <0.30   BNP: BNP (last 3 results) No results found for this basename: PROBNP:3 in the last 8760 hours CBG:  Lab 05/06/12 0623 05/05/12 2118 05/05/12 1608 05/05/12 1130 05/05/12 0540  GLUCAP 116* 82 131* 87 135*    Time coordinating discharge: 35 minutes  Signed:  Dnyla Antonetti,CHRISTOPHER  [facility] 05/06/2012, 10:59 AM

## 2012-05-05 NOTE — Progress Notes (Signed)
INITIAL ADULT NUTRITION ASSESSMENT Date: 05/05/2012   Time: 11:11 AM  INTERVENTION:  Snacks TID (10 am, 2 pm, 8 pm)  RD to continue to follow  Reason for Assessment: Malnutrition Screening Tool Report  ASSESSMENT: Male 51 y.o.  Dx: syncope  Hx:  Past Medical History  Diagnosis Date  . Hypertension   . Diabetes mellitus     poorly controlled by his report  . IBS (irritable bowel syndrome)   . History of narcotic addiction     past history of back pain  . Hypercholesteremia   . Testosterone deficiency   . Depression   . Obesity     Max weight was 390  . CAD (coronary artery disease)  cardiac cath with moderate disease in a septal branch of the ramus intermedius 04/01/2012  . Panic attacks   . OSA on CPAP     Related Meds:     . sodium chloride   Intravenous Once  . amLODipine  5 mg Oral Daily  . enoxaparin (LOVENOX) injection  40 mg Subcutaneous Q24H  . glipiZIDE  10 mg Oral BID AC  . insulin aspart  0-15 Units Subcutaneous TID WC  . insulin aspart  0-5 Units Subcutaneous QHS  . linagliptin  5 mg Oral Daily  . lisinopril  10 mg Oral Daily  . metFORMIN  1,000 mg Oral BID WC  . sodium chloride  3 mL Intravenous Q12H  . DISCONTD: sitaGLIPtan-metformin  1 tablet Oral BID WC    Ht: 5\' 11"  (180.3 cm)  Wt: 321 lb 6.9 oz (145.8 kg)  Ideal Wt: 78.1 kg % Ideal Wt: 201%  Usual Wt: 390 lb  % Usual Wt: 82%  Body mass index is 44.83 kg/(m^2).  Food/Nutrition Related Hx: recent weight lost without trying per admission nutrition screen  Labs:  CMP     Component Value Date/Time   NA 137 05/05/2012 0505   K 3.9 05/05/2012 0505   CL 103 05/05/2012 0505   CO2 24 05/05/2012 0505   GLUCOSE 148* 05/05/2012 0505   BUN 10 05/05/2012 0505   CREATININE 0.66 05/05/2012 0505   CALCIUM 9.1 05/05/2012 0505   PROT 6.7 05/04/2012 1200   ALBUMIN 3.5 05/04/2012 1200   AST 16 05/04/2012 1200   ALT 24 05/04/2012 1200   ALKPHOS 57 05/04/2012 1200   BILITOT 0.3 05/04/2012 1200   GFRNONAA  >90 05/05/2012 0505   GFRAA >90 05/05/2012 0505     Intake/Output Summary (Last 24 hours) at 05/05/12 1112 Last data filed at 05/05/12 0800  Gross per 24 hour  Intake    360 ml  Output      0 ml  Net    360 ml    CBG (last 3)   Basename 05/05/12 0540 05/04/12 2035  GLUCAP 135* 157*    Diet Order: Cardiac  Supplements/Tube Feeding: N/A  IVF:    0.9 % NaCl with KCl 20 mEq / L Last Rate: 75 mL/hr at 05/04/12 2216    Estimated Nutritional Needs:   Kcal: 2200-2400 Protein: 110-120 gm Fluid: 2.2-2.4 L  Patient admitted with syncope; he reports he's had a lot of stress in his life recently; has been eating well at home, however, has had progressive weight loss a little over a year (18%) -- not significant for time frame and desirable given morbid obesity; he states he was trying to lose weight and has made modifications in his eating behaviors; current PO intake 100% per flowsheet records; he is requesting snacks --  RD to order.  NUTRITION DIAGNOSIS: No nutrition diagnosis at this time  RELATED TO: ---  AS EVIDENCE BY: ---  MONITORING/EVALUATION(Goals): Goal: Oral intake with meals and snacks to meet >/= 90% of estimated nutrition needs Monitor: PO intake, weight, labs, I/O's  EDUCATION NEEDS: -No education needs identified at this time  Dietitian #: [phone]  DOCUMENTATION CODES Per approved criteria  -Morbid Obesity    Alger Memos 05/05/2012, 11:11 AM

## 2012-05-06 ENCOUNTER — Emergency Department (HOSPITAL_COMMUNITY)
Admission: EM | Admit: 2012-05-06 | Discharge: 2012-05-07 | Disposition: A | Discharge status: Home / Self Care | Payer: Self-pay | Attending: Emergency Medicine | Admitting: Emergency Medicine

## 2012-05-06 ENCOUNTER — Encounter (HOSPITAL_COMMUNITY): Payer: Self-pay | Admitting: *Deleted

## 2012-05-06 DIAGNOSIS — R197 Diarrhea, unspecified: Secondary | ICD-10-CM

## 2012-05-06 DIAGNOSIS — I10 Essential (primary) hypertension: Secondary | ICD-10-CM | POA: Insufficient documentation

## 2012-05-06 DIAGNOSIS — F329 Major depressive disorder, single episode, unspecified: Secondary | ICD-10-CM | POA: Insufficient documentation

## 2012-05-06 DIAGNOSIS — Z8659 Personal history of other mental and behavioral disorders: Secondary | ICD-10-CM

## 2012-05-06 DIAGNOSIS — F410 Panic disorder [episodic paroxysmal anxiety] without agoraphobia: Secondary | ICD-10-CM | POA: Insufficient documentation

## 2012-05-06 DIAGNOSIS — E119 Type 2 diabetes mellitus without complications: Secondary | ICD-10-CM

## 2012-05-06 DIAGNOSIS — Z79899 Other long term (current) drug therapy: Secondary | ICD-10-CM | POA: Insufficient documentation

## 2012-05-06 DIAGNOSIS — G4733 Obstructive sleep apnea (adult) (pediatric): Secondary | ICD-10-CM | POA: Insufficient documentation

## 2012-05-06 DIAGNOSIS — F4389 Other reactions to severe stress: Secondary | ICD-10-CM | POA: Insufficient documentation

## 2012-05-06 DIAGNOSIS — R55 Syncope and collapse: Secondary | ICD-10-CM | POA: Insufficient documentation

## 2012-05-06 DIAGNOSIS — F438 Other reactions to severe stress: Secondary | ICD-10-CM | POA: Insufficient documentation

## 2012-05-06 DIAGNOSIS — I2510 Atherosclerotic heart disease of native coronary artery without angina pectoris: Secondary | ICD-10-CM | POA: Insufficient documentation

## 2012-05-06 DIAGNOSIS — F3289 Other specified depressive episodes: Secondary | ICD-10-CM | POA: Insufficient documentation

## 2012-05-06 DIAGNOSIS — E7800 Pure hypercholesterolemia, unspecified: Secondary | ICD-10-CM | POA: Insufficient documentation

## 2012-05-06 DIAGNOSIS — K589 Irritable bowel syndrome without diarrhea: Secondary | ICD-10-CM | POA: Insufficient documentation

## 2012-05-06 DIAGNOSIS — Z6379 Other stressful life events affecting family and household: Secondary | ICD-10-CM

## 2012-05-06 LAB — CBC WITH DIFFERENTIAL/PLATELET
Basophils Absolute: 0 10*3/uL (ref 0.0–0.1)
Basophils Relative: 1 % (ref 0–1)
Eosinophils Absolute: 0.1 10*3/uL (ref 0.0–0.7)
Eosinophils Relative: 1 % (ref 0–5)
HCT: 43.8 % (ref 39.0–52.0)
Hemoglobin: 15.2 g/dL (ref 13.0–17.0)
Lymphocytes Relative: 19 % (ref 12–46)
Lymphs Abs: 1.6 10*3/uL (ref 0.7–4.0)
MCH: 30.3 pg (ref 26.0–34.0)
MCHC: 34.7 g/dL (ref 30.0–36.0)
MCV: 87.4 fL (ref 78.0–100.0)
Monocytes Absolute: 0.6 10*3/uL (ref 0.1–1.0)
Monocytes Relative: 7 % (ref 3–12)
Neutro Abs: 6.2 10*3/uL (ref 1.7–7.7)
Neutrophils Relative %: 73 % (ref 43–77)
Platelets: 294 10*3/uL (ref 150–400)
RBC: 5.01 MIL/uL (ref 4.22–5.81)
RDW: 13.2 % (ref 11.5–15.5)
WBC: 8.5 10*3/uL (ref 4.0–10.5)

## 2012-05-06 LAB — GLUCOSE, CAPILLARY
Glucose-Capillary: 116 mg/dL — ABNORMAL HIGH (ref 70–99)
Glucose-Capillary: 118 mg/dL — ABNORMAL HIGH (ref 70–99)
Glucose-Capillary: 119 mg/dL — ABNORMAL HIGH (ref 70–99)

## 2012-05-06 LAB — CBC
HCT: 42 % (ref 39.0–52.0)
Hemoglobin: 14.1 g/dL (ref 13.0–17.0)
MCH: 29.7 pg (ref 26.0–34.0)
MCHC: 33.6 g/dL (ref 30.0–36.0)
MCV: 88.6 fL (ref 78.0–100.0)
Platelets: 249 10*3/uL (ref 150–400)
RBC: 4.74 MIL/uL (ref 4.22–5.81)
RDW: 13.3 % (ref 11.5–15.5)
WBC: 6.1 10*3/uL (ref 4.0–10.5)

## 2012-05-06 LAB — BASIC METABOLIC PANEL
BUN: 12 mg/dL (ref 6–23)
BUN: 13 mg/dL (ref 6–23)
CO2: 24 mEq/L (ref 19–32)
CO2: 24 mEq/L (ref 19–32)
Calcium: 9.3 mg/dL (ref 8.4–10.5)
Calcium: 10 mg/dL (ref 8.4–10.5)
Chloride: 104 mEq/L (ref 96–112)
Chloride: 102 mEq/L (ref 96–112)
Creatinine, Ser: 0.62 mg/dL (ref 0.50–1.35)
Creatinine, Ser: 0.69 mg/dL (ref 0.50–1.35)
GFR calc Af Amer: >90 mL/min (ref >90)
GFR calc Af Amer: >90 mL/min (ref >90)
GFR calc non Af Amer: >90 mL/min (ref >90)
GFR calc non Af Amer: >90 mL/min (ref >90)
Glucose, Bld: 103 mg/dL — ABNORMAL HIGH (ref 70–99)
Glucose, Bld: 148 mg/dL — ABNORMAL HIGH (ref 70–99)
Potassium: 3.7 mEq/L (ref 3.5–5.1)
Potassium: 4 mEq/L (ref 3.5–5.1)
Sodium: 139 mEq/L (ref 135–145)
Sodium: 139 mEq/L (ref 135–145)

## 2012-05-06 LAB — POCT I-STAT TROPONIN I: Troponin i, poc: 0 ng/mL (ref 0.00–0.08)

## 2012-05-06 MED ORDER — CLONAZEPAM 0.5 MG PO TABS: 0.5000 mg | ORAL_TABLET | Freq: Two times a day (BID) | ORAL | Status: DC

## 2012-05-06 MED ORDER — DULOXETINE HCL 30 MG PO CPEP: 30.0000 mg | ORAL_CAPSULE | Freq: Every day | ORAL | Status: DC

## 2012-05-06 MED ORDER — OXYCODONE HCL 5 MG PO CAPS: 5.0000 mg | ORAL_CAPSULE | Freq: Four times a day (QID) | ORAL | Status: DC | PRN

## 2012-05-06 NOTE — ED Notes (Signed)
Pt was just discharged from [facility] this afternoon. Reports he was at [employer] when he had a syncopal episode, witnessed. Pt denies chest pain or sob. Pt reports dizziness and fatigue. gcs 15. Stroke scale negative.

## 2012-05-06 NOTE — Progress Notes (Signed)
Trish received call from [facility] requesting event monitor and follow up with EP as an outpatient for syncope.  Event monitor requested through our office.  Appt made with Dr Lewayne Bunting on 06-09-2012 at 10AM for EP consultation and to follow up on event monitor results.  Our office will contact patient for appt to have event monitor placed.  Please advise patient of above on discharge.    Gypsy Balsam, RN [employer]

## 2012-05-06 NOTE — Progress Notes (Signed)
Clinical Social Work Department CLINICAL SOCIAL WORK PSYCHIATRY SERVICE LINE ASSESSMENT 05/06/2012  Patient:  Tyler Brewer  Account:  [id number]  Admit Date:  05/04/2012  Clinical Social Worker:  Ashley Jacobs, LCSW  Date/Time:  05/06/2012 10:11 AM Referred by:  Physician  Date referred:  05/06/2012 Reason for Referral  Behavioral Health Issues   Presenting Symptoms/Problems (In the person's/family's own words):   " I have frequent panic attacks along with no medications". I had a really rough night last night and felt very hopeless.  patient reports his marriage is failing due to infidelity, job loss, financial struggles and a current relationship that has problems.  Patient reports only thing that worked for him is Xanax.   Abuse/Neglect/Trauma History (check all that apply)  Denies history   Abuse/Neglect/Trauma Comments:   none   Psychiatric History (check all that apply)  Denies history   Psychiatric medications:  Reports he has tried Zoloft, Cymbalta, Klonopin, Xanax and only thing that works is Xanax.  Patient reports he is currently not taking any medication   Current Mental Health Hospitalizations/Previous Mental Health History:   none reported   Current provider:   PCP   Place and Date:   unknown   Current Medications:   Scheduled Meds:   . amLODipine  5 mg Oral Daily  . clonazePAM  0.5 mg Oral BID  . DULoxetine  30 mg Oral Daily  . enoxaparin (LOVENOX) injection  40 mg Subcutaneous Q24H  . glipiZIDE  10 mg Oral BID AC  . insulin aspart  0-15 Units Subcutaneous TID WC  . insulin aspart  0-5 Units Subcutaneous QHS  . linagliptin  5 mg Oral Daily  . lisinopril  10 mg Oral Daily  . metFORMIN  1,000 mg Oral BID WC  . sodium chloride  3 mL Intravenous Q12H   Continuous Infusions:   . 0.9 % NaCl with KCl 20 mEq / L 75 mL/hr at 05/04/12 2216   PRN Meds:.acetaminophen, acetaminophen, alum & mag hydroxide-simeth, HYDROcodone-acetaminophen, ondansetron (ZOFRAN)  IV, ondansetron, zolpidem  Previous Impatient Admission/Date/Reason:   NA   Emotional Health / Current Symptoms    Suicide/Self Harm  Suicidal ideation (ex: "I can't take any more,I wish I could disappear")   Suicide attempt in the past:   Patient has passive thoughts of suicide in which he reports he is very hopeless due to his situation with his failed Geoffry Paradise, current girlfriend having issues with substance abuse, no contact with kids and job loss.  Patient reports he just feels sometimes he should just fall asleep and never wake up.  Reports he has no plan or intent, and no previous attempts in his past.  Patient reports he feels better this morning, but feeling hopeless at times. No evidence of anxiey.   Other harmful behavior:   none reported   Psychotic/Dissociative Symptoms  None reported   Other Psychotic/Dissociative Symptoms:   none reported.  Patient appropriate during assessment.    Attention/Behavioral Symptoms  Within Normal Limits   Other Attention / Behavioral Symptoms:   Patient admits to anxiety and feeling anxious, however his behaviors are no consistent with his reports. patient is very calm, very talkative, makes good eye contact and did not display any evidence of distress.  Reports he has been dealing with anxiety his whole life.  report numerous stressors regarding life problems, but as he talks about stressors he does not get worked up or appears anxious.  Moood is calm and cooperative.  Voice and communication  is calm and rhythm is normal.    Cognitive Impairment  Within Normal Limits   Other Cognitive Impairment:   patient alert and oriented x 4    Mood and Adjustment  Unstable/Inconsistent    Stress, Anxiety, Trauma, Any Recent Loss/Stressor  Relationship   Anxiety (frequency):   Phobia (specify):   Compulsive behavior (specify):   Obsessive behavior (specify):   Other:   See above listed stressors   Substance Abuse/Use  None    SBIRT completed (please refer for detailed history):  N  Self-reported substance use:   patient reports his girlfriend has multiple problems causing him stressors including being addicted to pain pills and other substances.   Urinary Drug Screen Completed:  Y Alcohol level:   nothing detected in UDS    Environmental/Housing/Living Arrangement  Stable housing   Who is in the home:   patient has moved in to his mothers home in [place]  Also will live in [place] with GF   Emergency contact:  Mother   Financial  IPRS   Patient's Strengths and Goals (patient's own words):   Patient agreeable for outpatient counseling   Clinical Social Worker's Interpretive Summary:   1. Patient identifies stressors causing him anxiety.  2.Started on medication, Klonopin in which will be scheduled.  3. Agreeable for counseling in which CSW will place on patient's DC paperwork.  4. TO dc home today with mother.  5. No safety concerns at this time, no SI with plan nor intent, no HI or psychosis   Disposition:  Outpatient referral made and information placed on DC paperwork. MD made aware of concerns. No other needs, will sign off.  Patient anticipated to DC home today per attending MD.

## 2012-05-07 ENCOUNTER — Encounter (HOSPITAL_BASED_OUTPATIENT_CLINIC_OR_DEPARTMENT_OTHER): Payer: Self-pay | Admitting: *Deleted

## 2012-05-07 ENCOUNTER — Emergency Department (HOSPITAL_BASED_OUTPATIENT_CLINIC_OR_DEPARTMENT_OTHER)
Admission: EM | Admit: 2012-05-07 | Discharge: 2012-05-08 | Disposition: A | Discharge status: Other Acute Inpatient Hospital | Payer: Self-pay | Attending: Emergency Medicine | Admitting: Emergency Medicine

## 2012-05-07 ENCOUNTER — Telehealth: Payer: Self-pay

## 2012-05-07 DIAGNOSIS — E119 Type 2 diabetes mellitus without complications: Secondary | ICD-10-CM | POA: Insufficient documentation

## 2012-05-07 DIAGNOSIS — Z8249 Family history of ischemic heart disease and other diseases of the circulatory system: Secondary | ICD-10-CM | POA: Insufficient documentation

## 2012-05-07 DIAGNOSIS — Z833 Family history of diabetes mellitus: Secondary | ICD-10-CM | POA: Insufficient documentation

## 2012-05-07 DIAGNOSIS — I10 Essential (primary) hypertension: Secondary | ICD-10-CM | POA: Insufficient documentation

## 2012-05-07 DIAGNOSIS — I2510 Atherosclerotic heart disease of native coronary artery without angina pectoris: Secondary | ICD-10-CM | POA: Insufficient documentation

## 2012-05-07 DIAGNOSIS — F329 Major depressive disorder, single episode, unspecified: Secondary | ICD-10-CM | POA: Insufficient documentation

## 2012-05-07 DIAGNOSIS — E669 Obesity, unspecified: Secondary | ICD-10-CM | POA: Insufficient documentation

## 2012-05-07 DIAGNOSIS — G4733 Obstructive sleep apnea (adult) (pediatric): Secondary | ICD-10-CM | POA: Insufficient documentation

## 2012-05-07 DIAGNOSIS — F3289 Other specified depressive episodes: Secondary | ICD-10-CM | POA: Insufficient documentation

## 2012-05-07 DIAGNOSIS — R45851 Suicidal ideations: Secondary | ICD-10-CM | POA: Insufficient documentation

## 2012-05-07 LAB — BASIC METABOLIC PANEL
BUN: 13 mg/dL (ref 6–23)
CO2: 22 mEq/L (ref 19–32)
Calcium: 10 mg/dL (ref 8.4–10.5)
Chloride: 100 mEq/L (ref 96–112)
Creatinine, Ser: 0.7 mg/dL (ref 0.50–1.35)
GFR calc Af Amer: >90 mL/min (ref >90)
GFR calc non Af Amer: >90 mL/min (ref >90)
Glucose, Bld: 195 mg/dL — ABNORMAL HIGH (ref 70–99)
Potassium: 3.7 mEq/L (ref 3.5–5.1)
Sodium: 137 mEq/L (ref 135–145)

## 2012-05-07 LAB — CBC
HCT: 43.8 % (ref 39.0–52.0)
Hemoglobin: 15.1 g/dL (ref 13.0–17.0)
MCH: 29.9 pg (ref 26.0–34.0)
MCHC: 34.5 g/dL (ref 30.0–36.0)
MCV: 86.7 fL (ref 78.0–100.0)
Platelets: 285 10*3/uL (ref 150–400)
RBC: 5.05 MIL/uL (ref 4.22–5.81)
RDW: 13.4 % (ref 11.5–15.5)
WBC: 9.7 10*3/uL (ref 4.0–10.5)

## 2012-05-07 LAB — RAPID URINE DRUG SCREEN, HOSP PERFORMED
Amphetamines: NOT DETECTED
Barbiturates: NOT DETECTED
Benzodiazepines: NOT DETECTED
Cocaine: NOT DETECTED
Opiates: NOT DETECTED
Tetrahydrocannabinol: NOT DETECTED

## 2012-05-07 LAB — ETHANOL: Alcohol, Ethyl (B): <11 mg/dL (ref 0–11)

## 2012-05-07 MED ORDER — NICOTINE 21 MG/24HR TD PT24: 21.0000 mg | MEDICATED_PATCH | Freq: Every day | TRANSDERMAL | Status: DC

## 2012-05-07 MED ORDER — GLIPIZIDE 10 MG PO TABS
10.0000 mg | ORAL_TABLET | Freq: Two times a day (BID) | ORAL | Status: DC
  Administered 2012-05-08: 10 mg via ORAL
  Filled 2012-05-07: qty 1

## 2012-05-07 MED ORDER — AMLODIPINE BESYLATE 5 MG PO TABS
5.0000 mg | ORAL_TABLET | Freq: Every day | ORAL | Status: DC
  Administered 2012-05-08: 5 mg via ORAL

## 2012-05-07 MED ORDER — SITAGLIPTIN PHOS-METFORMIN HCL 50-1000 MG PO TABS
1.0000 | ORAL_TABLET | Freq: Two times a day (BID) | ORAL | Status: DC
  Administered 2012-05-08: 1 via ORAL

## 2012-05-07 MED ORDER — DULOXETINE HCL 30 MG PO CPEP
30.0000 mg | ORAL_CAPSULE | Freq: Every day | ORAL | Status: DC
  Administered 2012-05-08: 30 mg via ORAL
  Filled 2012-05-07: qty 1

## 2012-05-07 MED ORDER — ALUM & MAG HYDROXIDE-SIMETH 200-200-20 MG/5ML PO SUSP: 30.0000 mL | ORAL | Status: DC | PRN

## 2012-05-07 MED ORDER — ZOLPIDEM TARTRATE 5 MG PO TABS
5.0000 mg | ORAL_TABLET | Freq: Every evening | ORAL | Status: DC | PRN
  Filled 2012-05-07: qty 1

## 2012-05-07 MED ORDER — IBUPROFEN 200 MG PO TABS: 600.0000 mg | ORAL_TABLET | Freq: Three times a day (TID) | ORAL | Status: DC | PRN

## 2012-05-07 MED ORDER — ACETAMINOPHEN 325 MG PO TABS: 650.0000 mg | ORAL_TABLET | ORAL | Status: DC | PRN

## 2012-05-07 MED ORDER — LISINOPRIL 10 MG PO TABS
10.0000 mg | ORAL_TABLET | Freq: Every day | ORAL | Status: DC
  Administered 2012-05-08: 10 mg via ORAL

## 2012-05-07 MED ORDER — ONDANSETRON HCL 8 MG PO TABS: 4.0000 mg | ORAL_TABLET | Freq: Three times a day (TID) | ORAL | Status: DC | PRN

## 2012-05-07 NOTE — ED Notes (Signed)
pts mother here doesn't want to come visit but wanted staff to ask patient if he wanted her to take his car to her house to "keep it safe" pt states he does want her to keys given to pts mother. Pt notified that mobile crisis states will be here in an hour to evaluate him

## 2012-05-07 NOTE — ED Notes (Signed)
Staffing called sitter will be here from 3p to 11p

## 2012-05-07 NOTE — ED Provider Notes (Signed)
History     CSN: [id number]  Arrival date & time 05/06/12  1639   First MD Initiated Contact with Patient 05/06/12 2351      Chief Complaint  Patient presents with  . Loss of Consciousness  . Dizziness  . Fatigue    (Consider location/radiation/quality/duration/timing/severity/associated sxs/prior treatment) HPI Tyler Brewer is a 51 y.o. male was discharged from the hospital today with a workup for syncope. Patient left the hospital and went to [employer] and had another syncopal episode. Patient says he was seated, he had a prodrome of darkening vision, mild nausea, mild dizziness and then had an episode. He did not injure himself. Nothing is changed since he left the hospital, he denies any chest pain, shortness of breath, fever chills, nausea vomiting, denies using any street drugs drinking alcohol or using tobacco. No other alleviating or exacerbating factors no other associated symptoms.  Patient says he is in the middle of some stressful life events currently, is in the middle the divorce, he just broke up with his girlfriend today - which he says he feels good about.  Past Medical History  Diagnosis Date  . Hypertension   . Diabetes mellitus     poorly controlled by his report  . IBS (irritable bowel syndrome)   . History of narcotic addiction     past history of back pain  . Hypercholesteremia   . Testosterone deficiency   . Depression   . Obesity     Max weight was 390  . CAD (coronary artery disease)  cardiac cath with moderate disease in a septal branch of the ramus intermedius 04/01/2012  . Panic attacks   . OSA on CPAP     Past Surgical History  Procedure Date  . Cardiac catheterization     Family History  Problem Relation Age of Onset  . Diabetes type II    . Hypertension      History  Substance Use Topics  . Smoking status: Never Smoker   . Smokeless tobacco: Not on file  . Alcohol Use: No     Review of Systems At least 10pt or greater  review of systems completed and are negative except where specified in the HPI.  Allergies  Review of patient's allergies indicates no known allergies.  Home Medications   Current Outpatient Rx  Name Route Sig Dispense Refill  . ACETAMINOPHEN 500 MG PO TABS Oral Take 1,000 mg by mouth every 6 (six) hours as needed. For headache.    . AMLODIPINE BESYLATE 5 MG PO TABS Oral Take 5 mg by mouth daily.    [name] Kitchen CLONAZEPAM 0.5 MG PO TABS Oral Take 1 tablet (0.5 mg total) by mouth 2 (two) times daily. 60 tablet 0  . DULOXETINE HCL 30 MG PO CPEP Oral Take 1 capsule (30 mg total) by mouth daily. 30 capsule 0  . GLIPIZIDE 10 MG PO TABS Oral Take 1 tablet (10 mg total) by mouth 2 (two) times daily before a meal. 60 tablet 1  . LISINOPRIL 10 MG PO TABS Oral Take 10 mg by mouth daily.    . ALLERGY RELIEF/NASAL DECONGEST PO Oral Take 1 puff by mouth daily as needed. For nasal and allergy relief    . OXYCODONE HCL 5 MG PO CAPS Oral Take 5 mg by mouth every 6 (six) hours as needed. For pain    . PRAVASTATIN SODIUM 10 MG PO TABS Oral Take 10 mg by mouth daily.    [name] Kitchen SITAGLIPTIN-METFORMIN HCL 50-1000  MG PO TABS Oral Take 1 tablet by mouth 2 (two) times daily with a meal.      Date: 05/07/2012  Rate: 82 beats per minute  Rhythm: normal sinus rhythm  QRS Axis: normal  Intervals: normal  ST/T Wave abnormalities: normal  Conduction Disutrbances: none  Narrative Interpretation: unremarkable no changes from last EKG. Nonischemic  BP 156/103  Pulse 77  Temp 98.4 F (36.9 C) (Oral)  Resp 18  SpO2 93%  Physical Exam  Nursing notes reviewed.  Electronic medical record reviewed. VITAL SIGNS:   Filed Vitals:   05/06/12 2103 05/07/12 0120 05/07/12 0122 05/07/12 0124  BP: 149/95 148/94 161/98 156/103  Pulse: 82 71 76 77  Temp: 98.4 F (36.9 C)     TempSrc: Oral     Resp: 18     SpO2: 93%      CONSTITUTIONAL: Awake, oriented, appears non-toxic HENT: Atraumatic, normocephalic, oral mucosa pink and moist,  airway patent. Nares patent without drainage. External ears normal. EYES: Conjunctiva clear, EOMI, PERRLA NECK: Trachea midline, non-tender, supple CARDIOVASCULAR: Normal heart rate, Normal rhythm, No murmurs, rubs, gallops PULMONARY/CHEST: Clear to auscultation, no rhonchi, wheezes, or rales. Symmetrical breath sounds. Non-tender. ABDOMINAL: Non-distended, obese, soft, non-tender - no rebound or guarding.  BS normal. NEUROLOGIC: CN:  Facial sensation equal to light touch bilaterally.  Good muscle bulk in the masseter muscle and good lateral movement of the jaw.  Facial expressions equal and good strength with smile/frown and puffed cheeks.  Hearing grossly intact to finger rub test.  Uvula, tongue are midline with no deviation. Symmetrical palate elevation.  Trapezius and SCM muscles are 5/5 strength bilaterally.   DTR: patellar, Achilles tendon reflexes 2+ bilaterally.  No clonus. Strength: 5/5 strength flexors and extensors in the upper and lower extremities.  Grip strength 5/5. Sensation: Sensation intact distally to light touch. Cerebellar: No ataxia with walking or dysmetria with finger to nose, rapid alternating hand movements and heels to shin testing. Gait and Station: Normal heel/toe, and tandem gait.  Negative Romberg, no pronator drift EXTREMITIES: No clubbing, cyanosis, or edema SKIN: Warm, Dry, No erythema, No rash  ED Course  Procedures (including critical care time)  Labs Reviewed  BASIC METABOLIC PANEL - Abnormal; Notable for the following:    Glucose, Bld 148 (*)     All other components within normal limits  GLUCOSE, CAPILLARY - Abnormal; Notable for the following:    Glucose-Capillary 119 (*)     All other components within normal limits  CBC WITH DIFFERENTIAL  POCT I-STAT TROPONIN I   No results found.   No diagnosis found. Impression syncopal event, vasovagal syncope, stress reaction   MDM  Tyler Brewer is a 50 y.o. male presenting to the emergency  department with a series of stressful life events, patient has been worked up for syncope, hospitalized, as appropriate outpatient therapy and followup with primary care provider and cardiology for cardiac monitoring. Patient has been stable since being in the emergency department, no arrhythmias are seen on EKG, no ST or T wave abnormalities consistent with ischemia or infarction when compared with prior EKG. Laboratory workup is unremarkable. I do not think the patient is having a cardiac event, I think this is more of a vasovagal episode, stress may be a trigger in this patient. The patient is safe to be discharged home, he is appropriate followup. I've given explicit return instructions which the patient understands and accepts. He was discharged home stable and in good condition.  Jones Skene, MD 05/07/12 1610

## 2012-05-07 NOTE — ED Provider Notes (Addendum)
11:25 p.m. patient is not feeling suicidal. However he was feeling suicidal earlier today. Patient is calm cooperative at present [facility] Coma Score 15  Doug Sou, MD 05/07/12 2324 Please note that our  pyxis is not have genuine or glipizide. Pharmacy suggestion will obtain CBGs before each meal and cover with NovoLog as needed  Doug Sou, MD 05/08/12 0008

## 2012-05-07 NOTE — ED Provider Notes (Signed)
History     CSN: [id number]  Arrival date & time 05/07/12  1248   First MD Initiated Contact with Patient 05/07/12 1322      Chief Complaint  Patient presents with  . Medical Clearance    (Consider location/radiation/quality/duration/timing/severity/associated sxs/prior treatment) HPI Pt presents with c/o feeling suicidal.  He states that he has a lot of problems going on right now which is causing him to feel depressed and he thought of running his Zenaida Niece [place] today to kill himself.  He states he has medical problems (was recently admitted for syncope workup and discharged), legal problems, work problems, financial problems, going through a divorce.  States all these factors have piled up and over the past several days he has been feeling worse and worse.  Denies alcohol or substance use.  States he has tried multiple antidepressants and xanax in the past that have not helped his symptoms much.  Denies recent fever, no vomiting/diarrhea, no chest pain.  Was recently admitted and worked up for syncope.    Past Medical History  Diagnosis Date  . Hypertension   . Diabetes mellitus     poorly controlled by his report  . IBS (irritable bowel syndrome)   . History of narcotic addiction     past history of back pain  . Hypercholesteremia   . Testosterone deficiency   . Depression   . Obesity     Max weight was 390  . CAD (coronary artery disease)  cardiac cath with moderate disease in a septal branch of the ramus intermedius 04/01/2012  . Panic attacks   . OSA on CPAP     Past Surgical History  Procedure Date  . Cardiac catheterization     Family History  Problem Relation Age of Onset  . Diabetes type II    . Hypertension      History  Substance Use Topics  . Smoking status: Never Smoker   . Smokeless tobacco: Not on file  . Alcohol Use: No      Review of Systems ROS reviewed and all otherwise negative except for mentioned in HPI  Allergies  Review of  patient's allergies indicates no known allergies.  Home Medications   Current Outpatient Rx  Name Route Sig Dispense Refill  . ACETAMINOPHEN 500 MG PO TABS Oral Take 1,000 mg by mouth every 6 (six) hours as needed. For headache.    . AMLODIPINE BESYLATE 5 MG PO TABS Oral Take 5 mg by mouth daily.    [name] Kitchen CLONAZEPAM 0.5 MG PO TABS Oral Take 1 tablet (0.5 mg total) by mouth 2 (two) times daily. 60 tablet 0  . DULOXETINE HCL 30 MG PO CPEP Oral Take 1 capsule (30 mg total) by mouth daily. 30 capsule 0  . GLIPIZIDE 10 MG PO TABS Oral Take 1 tablet (10 mg total) by mouth 2 (two) times daily before a meal. 60 tablet 1  . LISINOPRIL 10 MG PO TABS Oral Take 10 mg by mouth daily.    . ALLERGY RELIEF/NASAL DECONGEST PO Oral Take 1 puff by mouth daily as needed. For nasal and allergy relief    . OXYCODONE HCL 5 MG PO CAPS Oral Take 5 mg by mouth every 6 (six) hours as needed. For pain    . PRAVASTATIN SODIUM 10 MG PO TABS Oral Take 10 mg by mouth daily.    [name] Kitchen SITAGLIPTIN-METFORMIN HCL 50-1000 MG PO TABS Oral Take 1 tablet by mouth 2 (two) times daily with a  meal.      BP 121/82  Pulse 72  Temp 97.5 F (36.4 C) (Oral)  Resp 18  SpO2 100% Vitals reviewed Physical Exam Physical Examination: General appearance - alert, well appearing, and in no distress Mental status - alert, oriented to person, place, and time Eyes - no conjunctival injection, no scleral icterus Mouth - mucous membranes moist, pharynx normal without lesions Chest - clear to auscultation, no wheezes, rales or rhonchi, symmetric air entry Heart - normal rate, regular rhythm, normal S1, S2, no murmurs, rubs, clicks or gallops Abdomen - soft, nontender, nondistended, no masses or organomegaly Neurological - alert, oriented, normal speech, no focal findings or movement disorder noted Extremities - peripheral pulses normal, no pedal edema, no clubbing or cyanosis Skin - normal coloration and turgor, no rashes Psych- calm and cooperative,  flat affect  ED Course  Procedures (including critical care time)  Labs Reviewed  BASIC METABOLIC PANEL - Abnormal; Notable for the following:    Glucose, Bld 195 (*)     All other components within normal limits  GLUCOSE, CAPILLARY - Abnormal; Notable for the following:    Glucose-Capillary 121 (*)     All other components within normal limits  CBC  ETHANOL  URINE RAPID DRUG SCREEN (HOSP PERFORMED)   No results found.   1. Suicidal ideation       MDM  Pt presents with c/o suicidal thoughts.  He states there are multiple stressors in his life right now.  Pt has been recently seen in ED and discharged in the last 24 hours and has had recent admission and discharge for syncope.  Mobile crisis team has been contacted and will see patient in the ED.  Repeat medical clearance labs being obtained now.  Holding orders written.    3:00 PM  Of note, on chart review, pt was seen by psychiatry in the ED and was started on cymbalta 30mg  po qD and recommended klonopin 0.5mg  BID, he has not started taking these medications.          Ethelda Chick, MD 05/08/12 [phone]

## 2012-05-07 NOTE — ED Notes (Signed)
Spoke w/ Thayer Ohm w/ [facility]- ETA is 1 hr for her arrival

## 2012-05-07 NOTE — ED Notes (Signed)
Pt allowed to shower. Sitter with pt.

## 2012-05-07 NOTE — ED Notes (Signed)
Pt states " lots of things happened recently thought about running my Tyler Brewer [place] today"

## 2012-05-07 NOTE — ED Notes (Signed)
MD at bedside. 

## 2012-05-07 NOTE — ED Notes (Signed)
Sitter is at the bedside.

## 2012-05-07 NOTE — ED Notes (Signed)
Spoke with Artist Pais in staffing requested sitter also spoke with [facility] to notify of request to staffing

## 2012-05-07 NOTE — ED Notes (Signed)
Pt reports he had another "passing out" today at 1600, pt reports he was sitting at the computer when he passed out, pt reports he was at [employer] w/friends, pt denies hitting his head. Pt reports he was "out" 1 min. Pt reports another episode on Sunday was d/c from our facility 1230 today. Nad, rr even and unlabored. Pt reports RLQ pain x2-3 days

## 2012-05-08 LAB — GLUCOSE, CAPILLARY: Glucose-Capillary: 121 mg/dL — ABNORMAL HIGH (ref 70–99)

## 2012-05-08 MED ORDER — LISINOPRIL 10 MG PO TABS
ORAL_TABLET | ORAL | Status: AC
  Filled 2012-05-08: qty 1

## 2012-05-08 MED ORDER — LISINOPRIL 10 MG PO TABS
ORAL_TABLET | ORAL | Status: AC
  Administered 2012-05-08: 10 mg via ORAL
  Filled 2012-05-08: qty 1

## 2012-05-08 MED ORDER — AMLODIPINE BESYLATE 5 MG PO TABS
ORAL_TABLET | ORAL | Status: AC
  Administered 2012-05-08: 5 mg via ORAL
  Filled 2012-05-08: qty 1

## 2012-05-08 MED ORDER — AMLODIPINE BESYLATE 5 MG PO TABS
ORAL_TABLET | ORAL | Status: AC
  Filled 2012-05-08: qty 1

## 2012-05-08 NOTE — ED Notes (Signed)
Sue Lush from [facility] just arrived to work on "bed finding" for this pt

## 2012-05-08 NOTE — ED Provider Notes (Signed)
Patient with syncopal episode today when standing after having an episode of diarrhea.  Patient with multiple vascular risk factors including dm, hypertension, patient without acute abnormality on pe.  Plan admission for syncope.  Patient seen and evaluated by me and I agree with Ardell Isaacs assessment and evaluation and plan.   Hilario Quarry, MD 05/08/12 [phone]

## 2012-05-08 NOTE — ED Notes (Signed)
Patient resting quietly with sitter at bedside.

## 2012-05-08 NOTE — ED Notes (Signed)
Called the Phys. Doctor at behavior [facility]--not in yet--will call you back on [phone]----per Shedia at behavior.

## 2012-05-08 NOTE — ED Notes (Signed)
Tyler Brewer from [facility] calls this rn to state that he now has a bed available. Tyler Brewer

## 2012-05-09 MED FILL — Sitagliptin Phosphate Tab 50 MG (Base Equiv): ORAL | Qty: 1 | Status: AC

## 2012-05-09 MED FILL — Metformin HCl Tab 500 MG: ORAL | Qty: 2 | Status: AC

## 2012-05-16 NOTE — Telephone Encounter (Signed)
PATIENT HAS NOT CALL BACK TO MAKE APPT. FOR MONITOR.

## 2012-05-21 NOTE — ED Notes (Signed)
In pt's chart due to clerk of court office, Rosey Bath, calling in regards to where pt was transferred at dc from this facility.

## 2012-06-09 ENCOUNTER — Institutional Professional Consult (permissible substitution): Payer: Self-pay | Admitting: Internal Medicine

## 2013-07-30 LAB — PROTIME-INR: INR: 1.1 (ref 0.9–1.1)

## 2014-07-29 ENCOUNTER — Encounter (HOSPITAL_COMMUNITY): Payer: Self-pay | Admitting: Cardiology

## 2015-02-16 IMAGING — US US EXTREM LOW VENOUS BILAT
1 series · 13 of 24 positions shown · non-contrast
Comparison: None.

CLINICAL DATA: Bilateral lower extremity swelling and pain.



[Series 1: us extrem low venous bilat · 0.11mm/px · 13 of 51 slices shown]
[im 1/51]
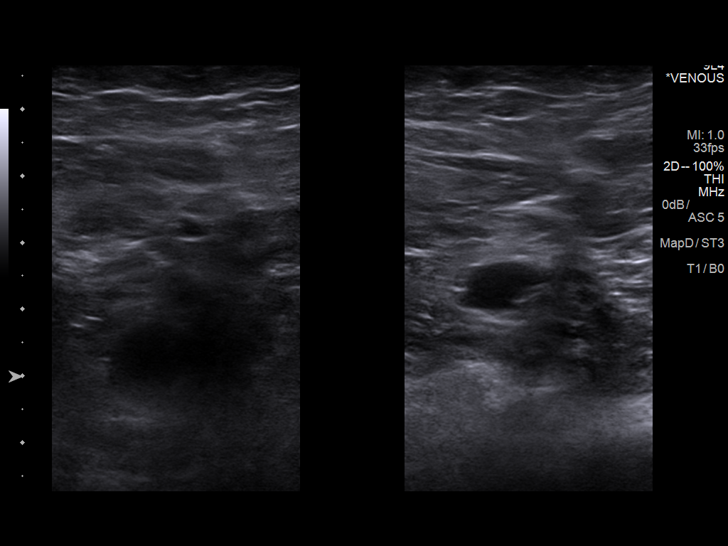
[im 5/51]
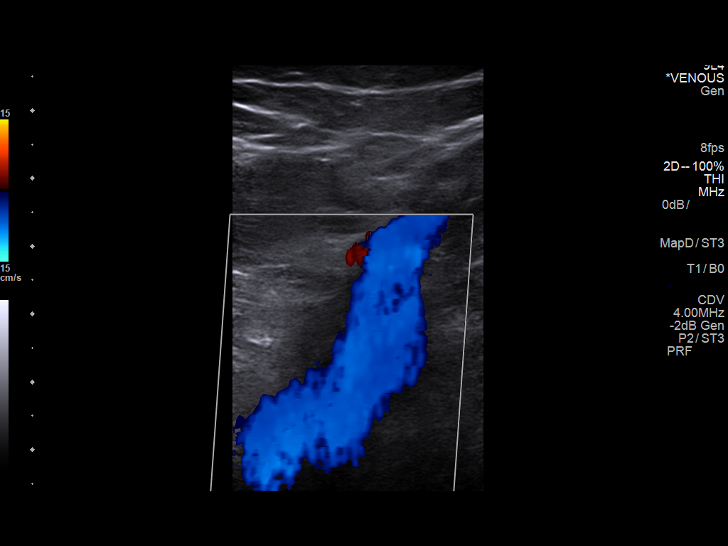
[im 9/51]
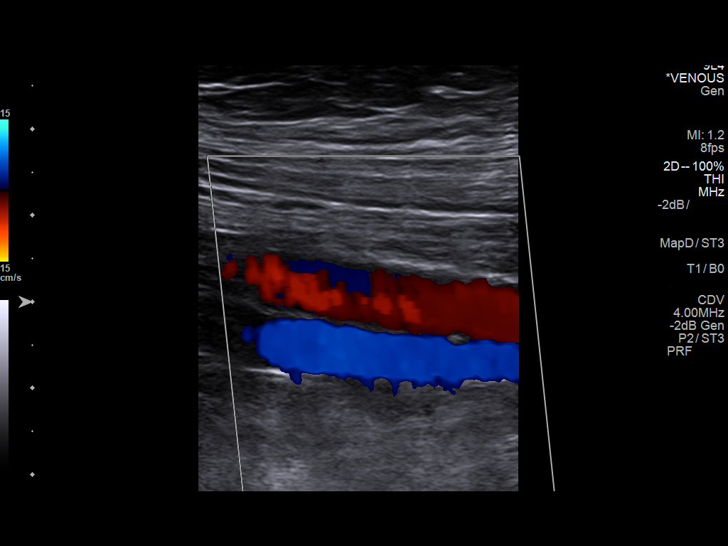
[im 14/51]
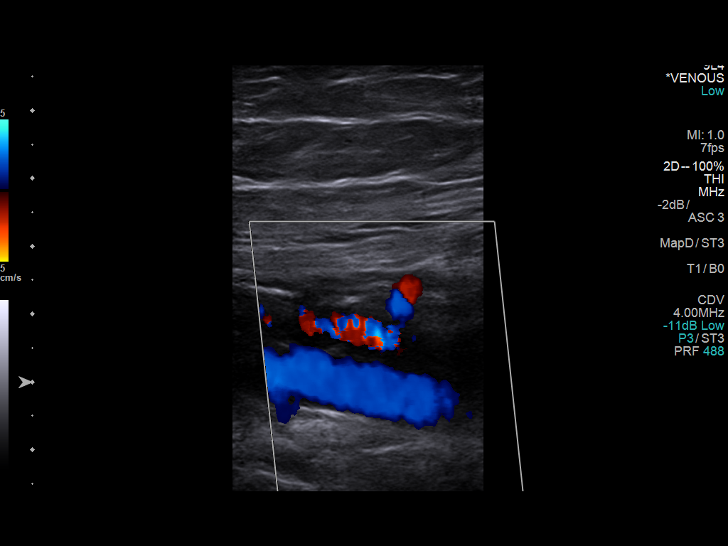
[im 18/51]
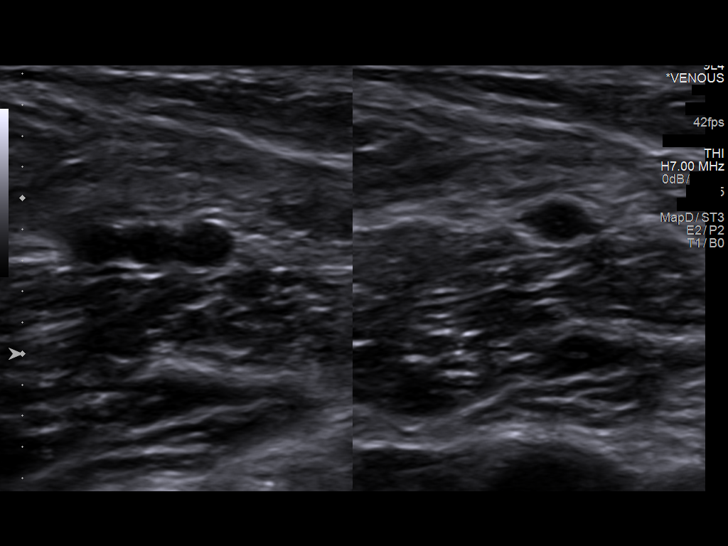
[im 22/51]
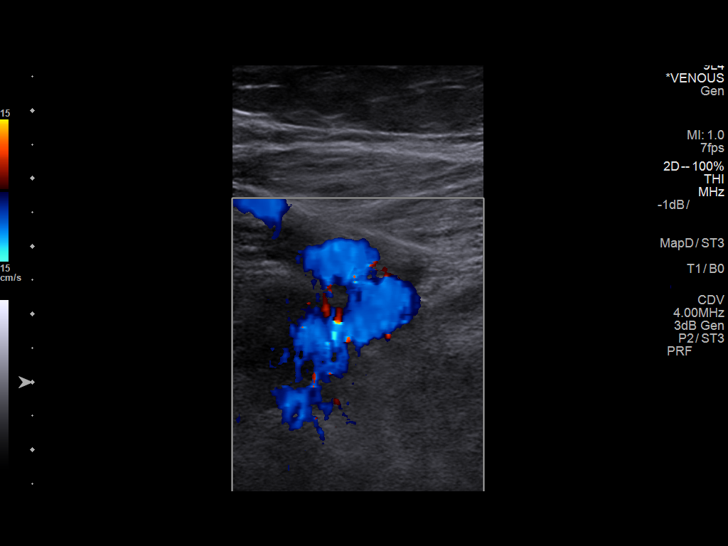
[im 27/51]
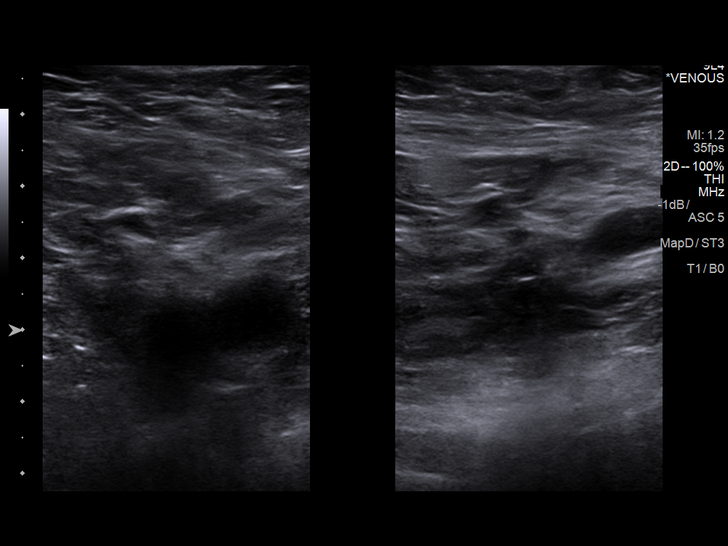
[im 29/51]
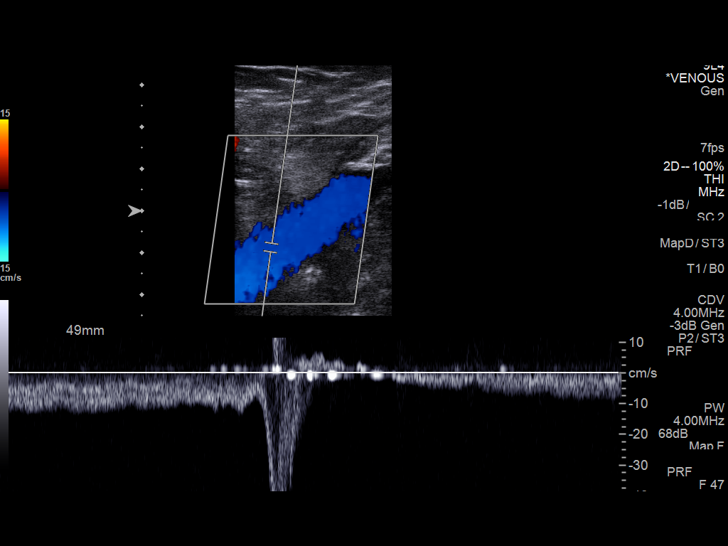
[im 33/51]
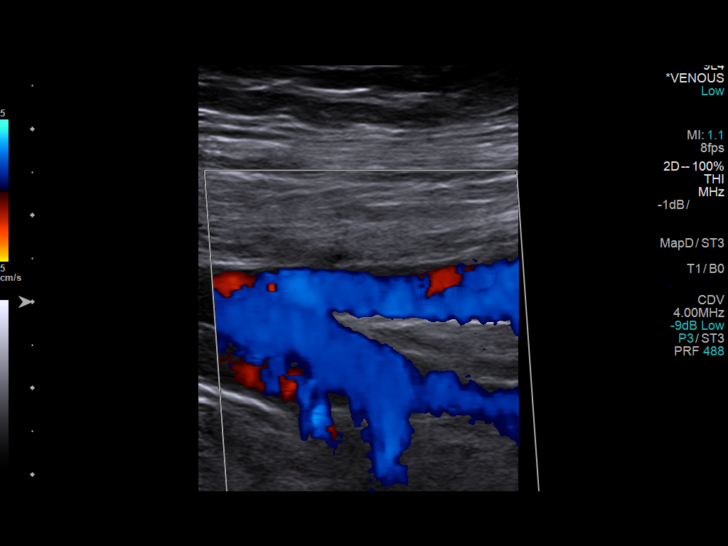
[im 37/51]
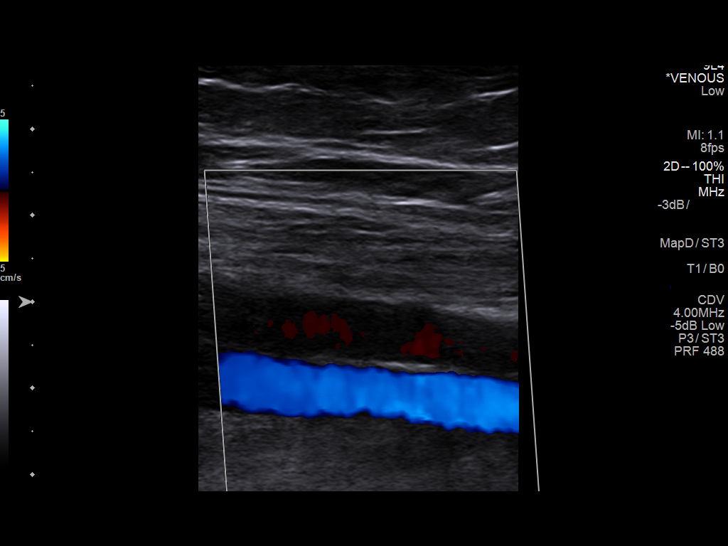
[im 42/51]
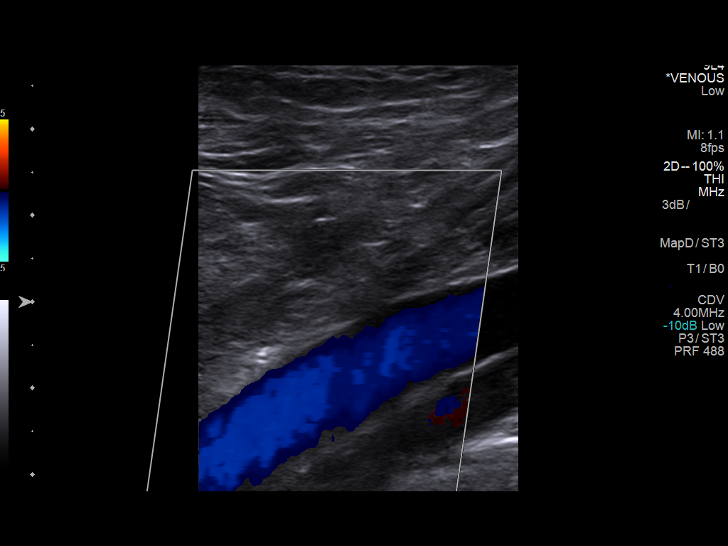
[im 46/51]
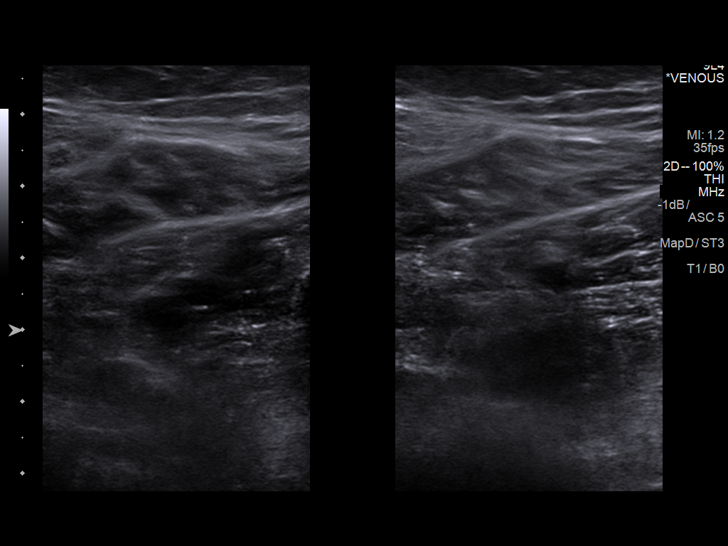
[im 51/51]
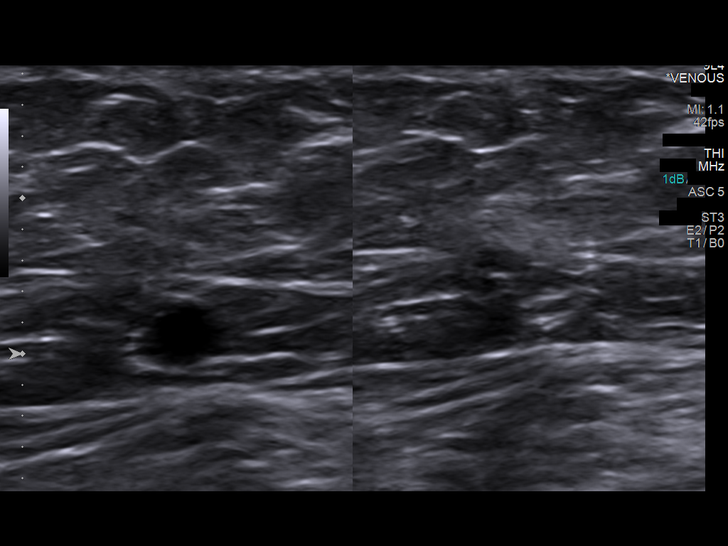

[13 of 24 positions shown; findings below may reference images not displayed]

FINDINGS: RIGHT LOWER EXTREMITY

Common Femoral Vein: No evidence of thrombus. Normal
compressibility, respiratory phasicity and response to augmentation.

Saphenofemoral Junction: No evidence of thrombus. Normal
compressibility and flow on color Doppler imaging.

Profunda Femoral Vein: No evidence of thrombus. Normal
compressibility and flow on color Doppler imaging.

Femoral Vein: No evidence of thrombus. Normal compressibility,
respiratory phasicity and response to augmentation.

Popliteal Vein: No evidence of thrombus. Normal compressibility,
respiratory phasicity and response to augmentation.

Calf Veins: No evidence of thrombus. Normal compressibility and flow
on color Doppler imaging.

Superficial Great Saphenous Vein: No evidence of thrombus. Normal
compressibility and flow on color Doppler imaging.

Venous Reflux:  None.

Other Findings:  None.

LEFT LOWER EXTREMITY

Common Femoral Vein: No evidence of thrombus. Normal
compressibility, respiratory phasicity and response to augmentation.

Saphenofemoral Junction: No evidence of thrombus. Normal
compressibility and flow on color Doppler imaging.

Profunda Femoral Vein: No evidence of thrombus. Normal
compressibility and flow on color Doppler imaging.

Femoral Vein: No evidence of thrombus. Normal compressibility,
respiratory phasicity and response to augmentation.

Popliteal Vein: No evidence of thrombus. Normal compressibility,
respiratory phasicity and response to augmentation.

Calf Veins: No evidence of thrombus. Normal compressibility and flow
on color Doppler imaging.

Superficial Great Saphenous Vein: No evidence of thrombus. Normal
compressibility and flow on color Doppler imaging.

Venous Reflux:  None.

Other Findings:  None.
IMPRESSION: No evidence of deep venous thrombosis.

## 2015-04-25 ENCOUNTER — Encounter (HOSPITAL_BASED_OUTPATIENT_CLINIC_OR_DEPARTMENT_OTHER): Payer: Self-pay | Admitting: Emergency Medicine

## 2015-04-25 ENCOUNTER — Emergency Department (HOSPITAL_BASED_OUTPATIENT_CLINIC_OR_DEPARTMENT_OTHER)
Admission: EM | Admit: 2015-04-25 | Discharge: 2015-04-25 | Disposition: A | Discharge status: Home / Self Care | Payer: Self-pay | Attending: Emergency Medicine | Admitting: Emergency Medicine

## 2015-04-25 DIAGNOSIS — E780 Pure hypercholesterolemia: Secondary | ICD-10-CM | POA: Insufficient documentation

## 2015-04-25 DIAGNOSIS — E669 Obesity, unspecified: Secondary | ICD-10-CM | POA: Insufficient documentation

## 2015-04-25 DIAGNOSIS — M545 Low back pain, unspecified: Secondary | ICD-10-CM

## 2015-04-25 DIAGNOSIS — Z9981 Dependence on supplemental oxygen: Secondary | ICD-10-CM | POA: Insufficient documentation

## 2015-04-25 DIAGNOSIS — Z79899 Other long term (current) drug therapy: Secondary | ICD-10-CM | POA: Insufficient documentation

## 2015-04-25 DIAGNOSIS — R600 Localized edema: Secondary | ICD-10-CM

## 2015-04-25 DIAGNOSIS — G4733 Obstructive sleep apnea (adult) (pediatric): Secondary | ICD-10-CM | POA: Insufficient documentation

## 2015-04-25 DIAGNOSIS — I2510 Atherosclerotic heart disease of native coronary artery without angina pectoris: Secondary | ICD-10-CM | POA: Insufficient documentation

## 2015-04-25 DIAGNOSIS — F329 Major depressive disorder, single episode, unspecified: Secondary | ICD-10-CM | POA: Insufficient documentation

## 2015-04-25 DIAGNOSIS — E119 Type 2 diabetes mellitus without complications: Secondary | ICD-10-CM | POA: Insufficient documentation

## 2015-04-25 DIAGNOSIS — G629 Polyneuropathy, unspecified: Secondary | ICD-10-CM | POA: Insufficient documentation

## 2015-04-25 DIAGNOSIS — I10 Essential (primary) hypertension: Secondary | ICD-10-CM | POA: Insufficient documentation

## 2015-04-25 DIAGNOSIS — Z8719 Personal history of other diseases of the digestive system: Secondary | ICD-10-CM | POA: Insufficient documentation

## 2015-04-25 DIAGNOSIS — R2243 Localized swelling, mass and lump, lower limb, bilateral: Secondary | ICD-10-CM | POA: Insufficient documentation

## 2015-04-25 DIAGNOSIS — F410 Panic disorder [episodic paroxysmal anxiety] without agoraphobia: Secondary | ICD-10-CM | POA: Insufficient documentation

## 2015-04-25 LAB — CBC WITH DIFFERENTIAL/PLATELET
Basophils Absolute: 0.1 10*3/uL (ref 0.0–0.1)
Basophils Relative: 1 % (ref 0–1)
Eosinophils Absolute: 0.2 10*3/uL (ref 0.0–0.7)
Eosinophils Relative: 3 % (ref 0–5)
HCT: 40.7 % (ref 39.0–52.0)
Hemoglobin: 13.6 g/dL (ref 13.0–17.0)
Lymphocytes Relative: 34 % (ref 12–46)
Lymphs Abs: 2.8 10*3/uL (ref 0.7–4.0)
MCH: 29.8 pg (ref 26.0–34.0)
MCHC: 33.4 g/dL (ref 30.0–36.0)
MCV: 89.1 fL (ref 78.0–100.0)
Monocytes Absolute: 0.9 10*3/uL (ref 0.1–1.0)
Monocytes Relative: 11 % (ref 3–12)
Neutro Abs: 4.2 10*3/uL (ref 1.7–7.7)
Neutrophils Relative %: 51 % (ref 43–77)
Platelets: 259 10*3/uL (ref 150–400)
RBC: 4.57 MIL/uL (ref 4.22–5.81)
RDW: 12.9 % (ref 11.5–15.5)
WBC: 8.2 10*3/uL (ref 4.0–10.5)

## 2015-04-25 LAB — URINE MICROSCOPIC-ADD ON

## 2015-04-25 LAB — BASIC METABOLIC PANEL
Anion gap: 8 (ref 5–15)
BUN: 21 mg/dL — ABNORMAL HIGH (ref 6–20)
CO2: 30 mmol/L (ref 22–32)
Calcium: 10 mg/dL (ref 8.9–10.3)
Chloride: 103 mmol/L (ref 101–111)
Creatinine, Ser: 1.19 mg/dL (ref 0.61–1.24)
GFR calc Af Amer: >60 mL/min (ref >60)
GFR calc non Af Amer: >60 mL/min (ref >60)
Glucose, Bld: 159 mg/dL — ABNORMAL HIGH (ref 65–99)
Potassium: 3.9 mmol/L (ref 3.5–5.1)
Sodium: 141 mmol/L (ref 135–145)

## 2015-04-25 LAB — URINALYSIS, ROUTINE W REFLEX MICROSCOPIC
Glucose, UA: NEGATIVE mg/dL
Hgb urine dipstick: NEGATIVE
Ketones, ur: 15 mg/dL — AB
Nitrite: NEGATIVE
Protein, ur: NEGATIVE mg/dL
Specific Gravity, Urine: 1.028 (ref 1.005–1.030)
Urobilinogen, UA: 0.2 mg/dL (ref 0.0–1.0)
pH: 5 (ref 5.0–8.0)

## 2015-04-25 LAB — BRAIN NATRIURETIC PEPTIDE: B Natriuretic Peptide: 12.8 pg/mL (ref 0.0–100.0)

## 2015-04-25 MED ORDER — METHOCARBAMOL 500 MG PO TABS: 500.0000 mg | ORAL_TABLET | Freq: Two times a day (BID) | ORAL | Status: DC

## 2015-04-25 NOTE — ED Notes (Addendum)
Patient reports that he is having pain in his lower back, feet and legs. Numbness to both legs, the patient denies any loss of bowel or bladder function.

## 2015-04-25 NOTE — ED Provider Notes (Signed)
CSN: [id number]     Arrival date & time 04/25/15  1925 History   First MD Initiated Contact with Patient 04/25/15 2034     Chief Complaint  Patient presents with  . Back Pain     (Consider location/radiation/quality/duration/timing/severity/associated sxs/prior Treatment) HPI Comments: Patient presents with a chief complaint of lower back pain.  He reports that the pain is located across his lower back and does not radiate.  Pain worse with movement.  He has been taking Aleve, Tylenol and Aspirin for the pain without improvement.  He does report chronic pain and chronic numbness of both feet, but states that he has this at baseline.  He has a history of Neuropathy and is currently taking Gabapentin.  He denies bowel or bladder incontinence.  Denies saddle anaesthesia.  He reports that he is urinating less today.  Denies fever, chills, dysuria, increased urinary frequency, hematuria, weakness, abdominal pain, or chest pain.    Patient also reporting lower extremity edema bilaterally intermittently over the past couple of weeks.  He also reports a history of this and states that the edema typically improves when he elevates his legs.  He states that he has been on Lasix in the past, but is currently not on Lasix.  He denies CP, SOB, or hemoptysis.  Denies erythema or warmth of LE.  No history of DVT or PE.  No prolonged travel or surgeries in the past 4 weeks.    The history is provided by the patient.    Past Medical History  Diagnosis Date  . Hypertension   . Diabetes mellitus     poorly controlled by his report  . IBS (irritable bowel syndrome)   . History of narcotic addiction     past history of back pain  . Hypercholesteremia   . Testosterone deficiency   . Depression   . Obesity     Max weight was 390  . CAD (coronary artery disease)  cardiac cath with moderate disease in a septal branch of the ramus intermedius 04/01/2012  . Panic attacks   . OSA on CPAP    Past Surgical History   Procedure Laterality Date  . Cardiac catheterization    . Left heart catheterization with coronary angiogram N/A 03/31/2012    Procedure: LEFT HEART CATHETERIZATION WITH CORONARY ANGIOGRAM;  Surgeon: Leonie Man, MD;  Location: [facility] CATH LAB;  Service: Cardiovascular;  Laterality: N/A;   Family History  Problem Relation Age of Onset  . Diabetes type II    . Hypertension     Social History  Substance Use Topics  . Smoking status: Never Smoker   . Smokeless tobacco: None  . Alcohol Use: No    Review of Systems  All other systems reviewed and are negative.     Allergies  Review of patient's allergies indicates no known allergies.  Home Medications   Prior to Admission medications   Medication Sig Start Date End Date Taking? Authorizing Provider  hydrOXYzine (ATARAX/VISTARIL) 50 MG tablet Take 50 mg by mouth 3 (three) times daily as needed for anxiety.   Yes Historical Provider, MD  lamoTRIgine (LAMICTAL) 25 MG tablet Take 50 mg by mouth daily.   Yes Historical Provider, MD  metFORMIN (GLUCOPHAGE) 1000 MG tablet Take 1,000 mg by mouth 2 (two) times daily with a meal.   Yes Historical Provider, MD  acetaminophen (TYLENOL) 500 MG tablet Take 1,000 mg by mouth every 6 (six) hours as needed. For headache.    Historical Provider,  MD  amLODipine (NORVASC) 5 MG tablet Take 5 mg by mouth daily.    Historical Provider, MD  clonazePAM (KLONOPIN) 0.5 MG tablet Take 1 tablet (0.5 mg total) by mouth 2 (two) times daily. 05/06/12   Monika Salk, MD  DULoxetine (CYMBALTA) 30 MG capsule Take 1 capsule (30 mg total) by mouth daily. 05/06/12   Monika Salk, MD  glipiZIDE (GLUCOTROL) 10 MG tablet Take 1 tablet (10 mg total) by mouth 2 (two) times daily before a meal. 04/01/12 04/01/13  Debbe Odea, MD  lisinopril (PRINIVIL,ZESTRIL) 10 MG tablet Take 10 mg by mouth daily.    Historical Provider, MD  Loratadine-Pseudoephedrine (ALLERGY RELIEF/NASAL DECONGEST PO) Take 1 puff by mouth daily as needed. For  nasal and allergy relief    Historical Provider, MD  oxycodone (OXY-IR) 5 MG capsule Take 5 mg by mouth every 6 (six) hours as needed. For pain 05/06/12   Monika Salk, MD  pravastatin (PRAVACHOL) 10 MG tablet Take 10 mg by mouth daily.    Historical Provider, MD  sitaGLIPtan-metformin (JANUMET) 50-1000 MG per tablet Take 1 tablet by mouth 2 (two) times daily with a meal.    Historical Provider, MD   BP 153/95 mmHg  Pulse 84  Temp(Src) 98.7 F (37.1 C) (Oral)  Resp 22  Ht 5\' 11"  (1.803 m)  Wt 307 lb (139.254 kg)  BMI 42.84 kg/m2  SpO2 99% Physical Exam  Constitutional: He appears well-developed and well-nourished.  HENT:  Head: Normocephalic and atraumatic.  Mouth/Throat: Oropharynx is clear and moist.  Neck: Normal range of motion. Neck supple.  Cardiovascular: Normal rate, regular rhythm, normal heart sounds and intact distal pulses.   Pulmonary/Chest: Effort normal and breath sounds normal.  Abdominal: Soft. Bowel sounds are normal. He exhibits no distension and no mass. There is no tenderness. There is no rebound and no guarding.  Bladder scan 40 cc  Musculoskeletal: Normal range of motion.  1+ pitting edema of LE bilaterally.  Edema is symmetric.  No erythema or warmth.  Negative Homan's sign bilaterally.    Neurological: He is alert. He has normal strength. Gait normal.  Reflex Scores:      Patellar reflexes are 2+ on the right side and 2+ on the left side. Distal sensation of both feet intact  Skin: Skin is warm and dry.  Psychiatric: He has a normal mood and affect.  Nursing note and vitals reviewed.   ED Course  Procedures (including critical care time) Labs Review Labs Reviewed  URINALYSIS, ROUTINE W REFLEX MICROSCOPIC (NOT AT [facility])    Imaging Review No results found. I have personally reviewed and evaluated these images and lab results as part of my medical decision-making.   EKG Interpretation None      MDM   Final diagnoses:  None   Patient with back  pain.  No neurological deficits and normal neuro exam.  Patient can walk but states is painful.  No loss of bowel or bladder control.  No saddle anaesthesia.  No concern for cauda equina.  No fever, night sweats, weight loss, h/o cancer, IVDU.  Patient reports that he feels like he is urinating less today.  Bladder scan showing 40 cc.   UA not showing evidence of infection.  Labs unremarkable.  Patient stable for discharge.  Return precautions given.       Hyman Bible, PA-C 04/27/15 [place], MD 04/27/15 [phone]

## 2015-04-27 LAB — URINE CULTURE: Culture: NO GROWTH

## 2015-09-01 ENCOUNTER — Ambulatory Visit (INDEPENDENT_AMBULATORY_CARE_PROVIDER_SITE_OTHER): Payer: BLUE CROSS/BLUE SHIELD | Admitting: Family Medicine

## 2015-09-01 ENCOUNTER — Ambulatory Visit (HOSPITAL_BASED_OUTPATIENT_CLINIC_OR_DEPARTMENT_OTHER)
Admission: RE | Admit: 2015-09-01 | Discharge: 2015-09-01 | Disposition: A | Discharge status: Home / Self Care | Payer: BLUE CROSS/BLUE SHIELD | Source: Ambulatory Visit | Attending: Family Medicine | Admitting: Family Medicine

## 2015-09-01 ENCOUNTER — Encounter: Payer: Self-pay | Admitting: Family Medicine

## 2015-09-01 VITALS — BP 131/83 | HR 77 | Temp 98.3°F | Ht 71.0 in | Wt 315.8 lb

## 2015-09-01 DIAGNOSIS — Z8614 Personal history of Methicillin resistant Staphylococcus aureus infection: Secondary | ICD-10-CM | POA: Diagnosis not present

## 2015-09-01 DIAGNOSIS — E1100 Type 2 diabetes mellitus with hyperosmolarity without nonketotic hyperglycemic-hyperosmolar coma (NKHHC): Secondary | ICD-10-CM

## 2015-09-01 DIAGNOSIS — G4730 Sleep apnea, unspecified: Secondary | ICD-10-CM | POA: Diagnosis not present

## 2015-09-01 DIAGNOSIS — M79604 Pain in right leg: Secondary | ICD-10-CM

## 2015-09-01 DIAGNOSIS — I10 Essential (primary) hypertension: Secondary | ICD-10-CM

## 2015-09-01 IMAGING — DX DG TIBIA/FIBULA 2V*R*
5 series · 5 of 5 positions shown · non-contrast
Comparison: None.

CLINICAL DATA: Right leg pain.  History of infection.

EXAM:
RIGHT TIBIA AND FIBULA - 2 VIEW

[tibia ap (1 of 3)]
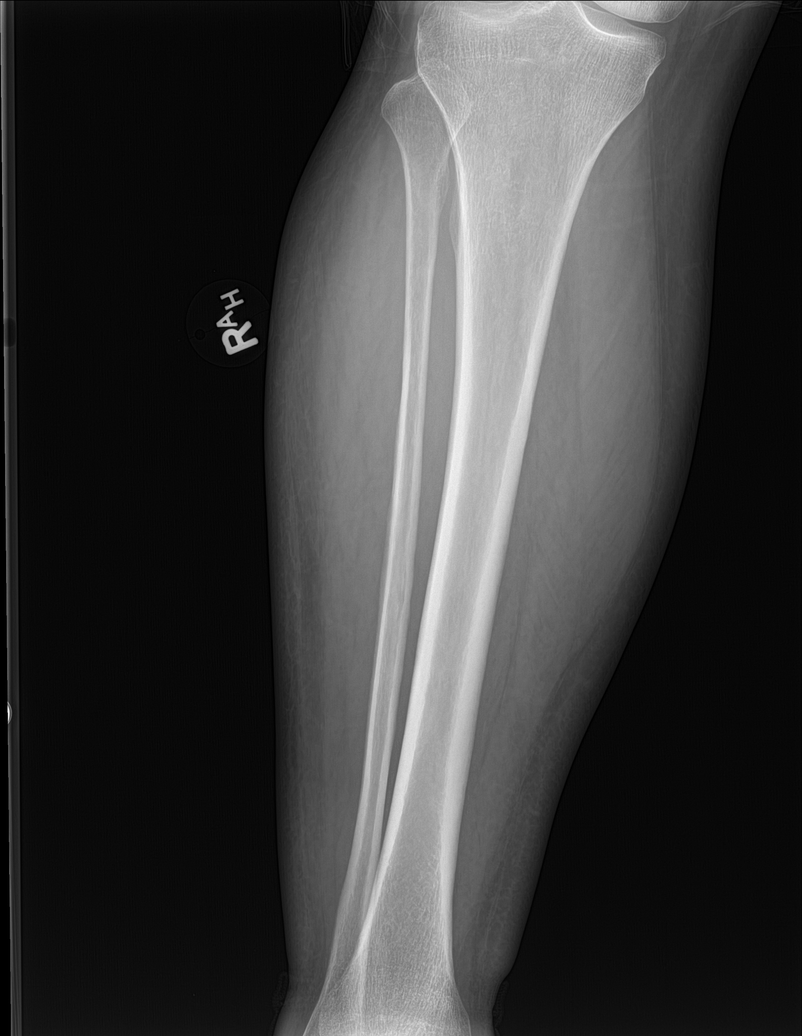

[tibia ap (2 of 3)]
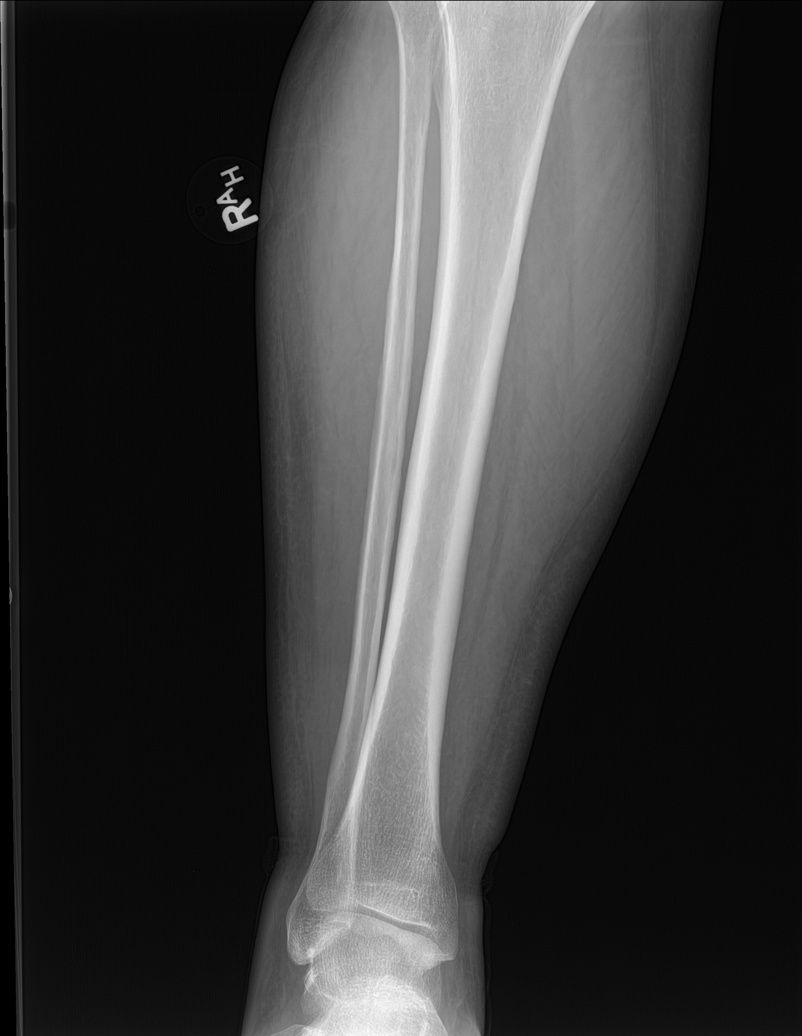

[tibia lat (1 of 2)]
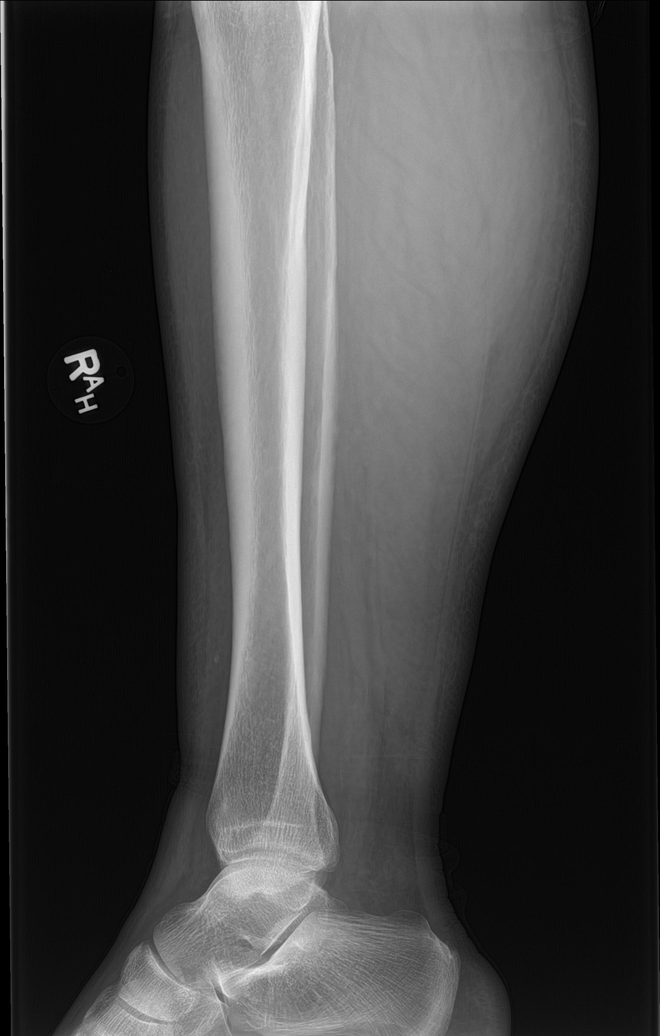

[tibia lat (2 of 2)]
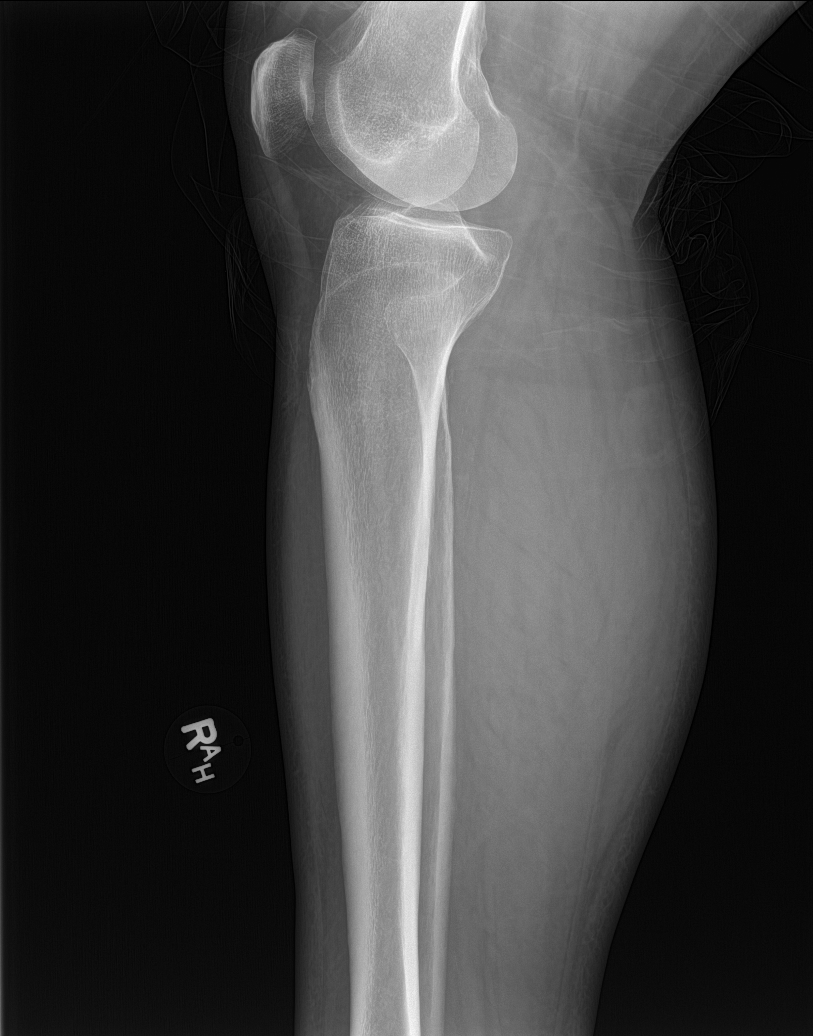

[tibia ap (3 of 3)]
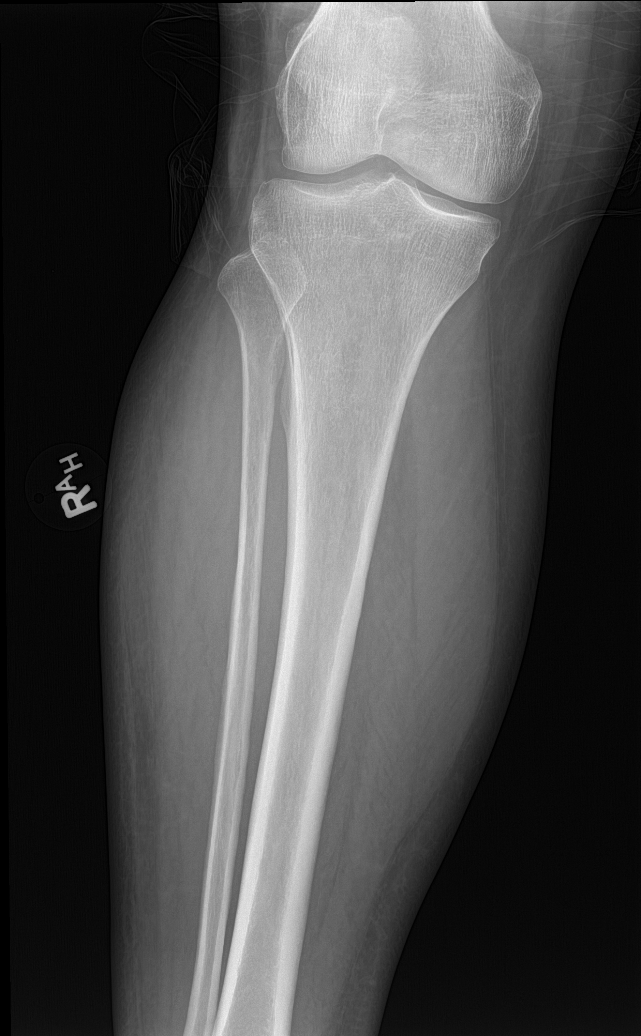

[5 of 5 positions shown; findings below may reference images not displayed]

FINDINGS: There is no evidence of fracture or other focal bone lesions. Soft
tissues are unremarkable.
IMPRESSION: Negative.

## 2015-09-01 MED ORDER — TRAMADOL HCL 50 MG PO TABS: 50.0000 mg | ORAL_TABLET | Freq: Three times a day (TID) | ORAL | Status: DC | PRN

## 2015-09-01 MED ORDER — DOXYCYCLINE HYCLATE 100 MG PO TABS: 100.0000 mg | ORAL_TABLET | Freq: Two times a day (BID) | ORAL | Status: DC

## 2015-09-01 MED ORDER — LOSARTAN POTASSIUM 50 MG PO TABS: 50.0000 mg | ORAL_TABLET | Freq: Every day | ORAL | Status: DC

## 2015-09-01 NOTE — Patient Instructions (Signed)
MRSA Infection, Adult MRSA stands for methicillin-resistant Staphylococcus aureus. This type of infection is caused by Staphylococcus aureus bacteria that are no longer affected by the medicines used to kill them (drug resistant). Staphylococcus (staph) bacteria are normally found on the skin or in the nose of healthy people. In most cases, these bacteria do not cause infection. But if these resistant bacteria enter your body through a cut or sore, they can cause a serious infection on your skin or in other parts of your body. There is a slight chance that the staph on your skin or in your nose is MRSA. There are two types of MRSA infections:  Hospital-acquired MRSA is bacteria that you get in the hospital.  Community-acquired MRSA is bacteria that you get somewhere other than in a hospital. RISK FACTORS Hospital-acquired MRSA is more common. You could be at risk for this infection if you are in the hospital and you:  Have surgery or a procedure.  Have an IV access or a catheter tube placed in your body.  Have weak resistance to germs (weakened immune system).  Are elderly.  Are on kidney dialysis. You could be at risk for community-acquired MRSA if you have a break in your skin and come into contact with MRSA. This may happen if you:  Play sports where there is skin-to-skin contact.  Live in a crowded setting, like a dormitory or a military barracks.  Share towels, razors, or sports equipment with other people. SYMPTOMS  Symptoms of hospital-acquired MRSA depend on where MRSA has spread. Symptoms may include:  Wound infection.  Skin infection.  Rash.  Pneumonia.  Fever and chills.  Difficulty breathing.  Chest pain. Community-acquired MRSA is most likely to start as a scratch or cut that becomes infected. Symptoms may include:  A pus-filled pimple.  A boil on your skin.  Pus draining from your skin.  A sore (abscess) under your skin or somewhere in your  body.  Fever with or without chills. DIAGNOSIS  The diagnosis of MRSA is made by taking a sample from an infected area and sending it to a lab for testing. A lab technician can grow (culture) MRSA and check it under a microscope. The cultured MRSA can be tested to see which type of antibiotic medicine will work to treat it. Newer tests can identify MRSA more quickly by testing bacteria samples for MRSA genes. Your health care provider can diagnose MRSA using samples from:   Cuts or wounds in infected areas.  Nasal swabs.  Saliva or cough specimens from deep in the lungs (sputum).  Urine.  Blood. You may also have:  Imaging studies (such as X-ray or MRI) to check if the infection has spread to the lungs, bones, or joints.  A culture and sensitivity test of blood or fluids from inside the joints. TREATMENT  Treatment depends on how severe, deep, or extensive the infection is. Very bad infections may require a hospital stay.  Some skin infections, such as a small boil or sore (abscess), may be treated by draining pus from the site of the infection.  More extensive surgery to drain pus may be necessary for deeper or more widespread soft tissue infections.  You may then have to take antibiotic medicine given by mouth or through a vein. You may start antibiotic treatment right away or after testing can be done to see what antibiotic medicine should be used. HOME CARE INSTRUCTIONS   Take your antibiotics as directed by your health care provider.   Take the medicine as prescribed until it is finished.  Avoid close contact with those around you as much as possible. Do not use towels, razors, toothbrushes, bedding, or other items that will be used by others.  Wash your hands frequently for 15 seconds with soap and water. Dry your hands with a clean or disposable towel.  When you are not able to wash your hands, use hand sanitizer that is more than 60 percent alcohol.  Wash towels, sheets,  or clothes in the washing machine with detergent and hot water. Dry them in a hot dryer.  Follow your health care provider's instructions for wound care. Wash your hands before and after changing your bandages.  Always shower after exercising.  Keep all cuts and scrapes clean and covered with a bandage.  Be sure to tell all your health care providers that you have MRSA so they are aware of your infection. SEEK MEDICAL CARE IF:  You have a cut, scrape, pimple, or boil that becomes red, swollen, or painful or has pus in it.  You have pus draining from your skin.  You have an abscess under your skin or somewhere in your body. SEEK IMMEDIATE MEDICAL CARE IF:   You have symptoms of a skin infection with a fever or chills.  You have trouble breathing.  You have chest pain.  You have a skin wound and you become nauseous or start vomiting. MAKE SURE YOU:  Understand these instructions.  Will watch your condition.  Will get help right away if you are not doing well or get worse.   This information is not intended to replace advice given to you by your health care provider. Make sure you discuss any questions you have with your health care provider.   Document Released: 08/06/2005 Document Revised: 12/21/2014 Document Reviewed: 05/29/2013 Elsevier Interactive Patient Education 2016 [employer]  

## 2015-09-01 NOTE — Progress Notes (Signed)
Pre visit review using our clinic review tool, if applicable. No additional management support is needed unless otherwise documented below in the visit note. 
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
## 2015-09-02 LAB — COMPREHENSIVE METABOLIC PANEL
ALT: 18 U/L (ref 0–53)
AST: 13 U/L (ref 0–37)
Albumin: 3.6 g/dL (ref 3.5–5.2)
Alkaline Phosphatase: 65 U/L (ref 39–117)
BUN: 14 mg/dL (ref 6–23)
CO2: 26 mEq/L (ref 19–32)
Calcium: 9.6 mg/dL (ref 8.4–10.5)
Chloride: 106 mEq/L (ref 96–112)
Creatinine, Ser: 0.78 mg/dL (ref 0.40–1.50)
GFR: 110.96 mL/min (ref >60.00)
Glucose, Bld: 118 mg/dL — ABNORMAL HIGH (ref 70–99)
Potassium: 3.8 mEq/L (ref 3.5–5.1)
Sodium: 141 mEq/L (ref 135–145)
Total Bilirubin: 0.3 mg/dL (ref 0.2–1.2)
Total Protein: 6.3 g/dL (ref 6.0–8.3)

## 2015-09-02 LAB — CBC WITH DIFFERENTIAL/PLATELET
Basophils Absolute: 0 10*3/uL (ref 0.0–0.1)
Basophils Relative: 0.6 % (ref 0.0–3.0)
Eosinophils Absolute: 0.3 10*3/uL (ref 0.0–0.7)
Eosinophils Relative: 5.5 % — ABNORMAL HIGH (ref 0.0–5.0)
HCT: 40.8 % (ref 39.0–52.0)
Hemoglobin: 13.6 g/dL (ref 13.0–17.0)
Lymphocytes Relative: 38.2 % (ref 12.0–46.0)
Lymphs Abs: 1.9 10*3/uL (ref 0.7–4.0)
MCHC: 33.2 g/dL (ref 30.0–36.0)
MCV: 86.8 fl (ref 78.0–100.0)
Monocytes Absolute: 0.6 10*3/uL (ref 0.1–1.0)
Monocytes Relative: 12.5 % — ABNORMAL HIGH (ref 3.0–12.0)
Neutro Abs: 2.2 10*3/uL (ref 1.4–7.7)
Neutrophils Relative %: 43.2 % (ref 43.0–77.0)
Platelets: 267 10*3/uL (ref 150.0–400.0)
RBC: 4.7 Mil/uL (ref 4.22–5.81)
RDW: 13.5 % (ref 11.5–15.5)
WBC: 5.1 10*3/uL (ref 4.0–10.5)

## 2015-09-02 LAB — LIPID PANEL
Cholesterol: 123 mg/dL (ref 0–200)
HDL: 34 mg/dL — ABNORMAL LOW (ref >39.00)
LDL Cholesterol: 62 mg/dL (ref 0–99)
NonHDL: 89.09
Total CHOL/HDL Ratio: 4
Triglycerides: 136 mg/dL (ref 0.0–149.0)
VLDL: 27.2 mg/dL (ref 0.0–40.0)

## 2015-09-02 LAB — BASIC METABOLIC PANEL
BUN: 14 mg/dL (ref 6–23)
CO2: 26 mEq/L (ref 19–32)
Calcium: 9.6 mg/dL (ref 8.4–10.5)
Chloride: 106 mEq/L (ref 96–112)
Creatinine, Ser: 0.78 mg/dL (ref 0.40–1.50)
GFR: 110.96 mL/min (ref >60.00)
Glucose, Bld: 118 mg/dL — ABNORMAL HIGH (ref 70–99)
Potassium: 3.8 mEq/L (ref 3.5–5.1)
Sodium: 141 mEq/L (ref 135–145)

## 2015-09-02 LAB — HEMOGLOBIN A1C: Hgb A1c MFr Bld: 8 % — ABNORMAL HIGH (ref 4.6–6.5)

## 2015-09-07 ENCOUNTER — Other Ambulatory Visit: Payer: Self-pay

## 2015-09-07 MED ORDER — GABAPENTIN 300 MG PO CAPS: 300.0000 mg | ORAL_CAPSULE | Freq: Two times a day (BID) | ORAL | Status: DC

## 2015-09-07 MED ORDER — GLIPIZIDE 10 MG PO TABS: 10.0000 mg | ORAL_TABLET | Freq: Two times a day (BID) | ORAL | Status: DC

## 2015-09-12 ENCOUNTER — Encounter: Payer: Self-pay | Admitting: Family Medicine

## 2015-09-12 ENCOUNTER — Ambulatory Visit (INDEPENDENT_AMBULATORY_CARE_PROVIDER_SITE_OTHER): Payer: BLUE CROSS/BLUE SHIELD | Admitting: Family Medicine

## 2015-09-12 VITALS — BP 140/84 | HR 84 | Temp 98.1°F | Ht 71.0 in | Wt 318.8 lb

## 2015-09-12 DIAGNOSIS — M79604 Pain in right leg: Secondary | ICD-10-CM | POA: Diagnosis not present

## 2015-09-12 DIAGNOSIS — Z8614 Personal history of Methicillin resistant Staphylococcus aureus infection: Secondary | ICD-10-CM

## 2015-09-12 DIAGNOSIS — E291 Testicular hypofunction: Secondary | ICD-10-CM | POA: Diagnosis not present

## 2015-09-12 DIAGNOSIS — R21 Rash and other nonspecific skin eruption: Secondary | ICD-10-CM | POA: Diagnosis not present

## 2015-09-12 DIAGNOSIS — E349 Endocrine disorder, unspecified: Secondary | ICD-10-CM

## 2015-09-12 MED ORDER — DOXYCYCLINE HYCLATE 100 MG PO TABS: 100.0000 mg | ORAL_TABLET | Freq: Two times a day (BID) | ORAL | Status: DC

## 2015-09-12 NOTE — Progress Notes (Signed)
Pre visit review using our clinic review tool, if applicable. No additional management support is needed unless otherwise documented below in the visit note. 

## 2015-09-12 NOTE — Progress Notes (Signed)
Patient ID: Tyler Brewer, male    DOB: 11/01/1960  Age: 55 y.o. MRN: [id number]    Subjective:  Subjective HPI Tyler Brewer presents for f/u cellulitis.  He is still on doxy and it is better but occasionally itchy.  He is also c/o fatigue and has a hx of low testosterone.  He is requesting this be checked.    Review of Systems  Constitutional: Negative for diaphoresis, appetite change, fatigue and unexpected weight change.  Eyes: Negative for pain, redness and visual disturbance.  Respiratory: Negative for cough, chest tightness, shortness of breath and wheezing.   Cardiovascular: Negative for chest pain, palpitations and leg swelling.  Endocrine: Negative for cold intolerance, heat intolerance, polydipsia, polyphagia and polyuria.  Genitourinary: Negative for dysuria, frequency and difficulty urinating.  Neurological: Negative for dizziness, light-headedness, numbness and headaches.    History Past Medical History  Diagnosis Date  . Hypertension   . Diabetes mellitus     poorly controlled by his report  . IBS (irritable bowel syndrome)   . History of narcotic addiction ([place])     past history of back pain  . Hypercholesteremia   . Testosterone deficiency   . Depression   . Obesity     Max weight was 390  . CAD (coronary artery disease)  cardiac cath with moderate disease in a septal branch of the ramus intermedius 04/01/2012  . Panic attacks   . OSA on CPAP     He has past surgical history that includes Cardiac catheterization and left heart catheterization with coronary angiogram (N/A, 03/31/2012).   His family history is not on file.He reports that he has never smoked. He does not have any smokeless tobacco history on file. He reports that he does not drink alcohol or use illicit drugs.  Current Outpatient Prescriptions on File Prior to Visit  Medication Sig Dispense Refill  . amLODipine (NORVASC) 10 MG tablet Take 10 mg by mouth daily.    . clindamycin (CLEOCIN) 300  MG capsule Take 300 mg by mouth 4 (four) times daily.    [name] Kitchen gabapentin (NEURONTIN) 300 MG capsule Take 1 capsule (300 mg total) by mouth 2 (two) times daily. 60 capsule 2  . glipiZIDE (GLUCOTROL) 10 MG tablet Take 1 tablet (10 mg total) by mouth 2 (two) times daily before a meal. 60 tablet 2  . hydrOXYzine (ATARAX/VISTARIL) 50 MG tablet Take 50 mg by mouth 3 (three) times daily as needed for anxiety.    . lamoTRIgine (LAMICTAL) 100 MG tablet Take 50 mg by mouth daily.    [name] Kitchen lisinopril (PRINIVIL,ZESTRIL) 20 MG tablet Take 20 mg by mouth daily.    [name] Kitchen LORazepam (ATIVAN) 1 MG tablet Take 1 mg by mouth 3 (three) times daily as needed for anxiety.    [name] Kitchen losartan (COZAAR) 50 MG tablet Take 1 tablet (50 mg total) by mouth daily. 90 tablet 3  . metFORMIN (GLUCOPHAGE) 1000 MG tablet Take 1,000 mg by mouth 2 (two) times daily with a meal.    . pravastatin (PRAVACHOL) 10 MG tablet Take 10 mg by mouth daily.    . traMADol (ULTRAM) 50 MG tablet Take 1 tablet (50 mg total) by mouth every 8 (eight) hours as needed. 30 tablet 0   No current facility-administered medications on file prior to visit.     Objective:  Objective Physical Exam  Constitutional: He is oriented to person, place, and time. Vital signs are normal. He appears well-developed and well-nourished. He is sleeping.  HENT:  Head:  Normocephalic and atraumatic.  Mouth/Throat: Oropharynx is clear and moist.  Eyes: EOM are normal. Pupils are equal, round, and reactive to light.  Neck: Normal range of motion. Neck supple. No thyromegaly present.  Cardiovascular: Normal rate and regular rhythm.   No murmur heard. Pulmonary/Chest: Effort normal and breath sounds normal. No respiratory distress. He has no wheezes. He has no rales. He exhibits no tenderness.  Musculoskeletal: He exhibits no edema or tenderness.  Neurological: He is alert and oriented to person, place, and time.  Skin: Skin is warm and dry. Rash noted. No erythema. No pallor.    Psychiatric: He has a normal mood and affect. His behavior is normal. Judgment and thought content normal.   BP 140/84 mmHg  Pulse 84  Temp(Src) 98.1 F (36.7 C) (Oral)  Ht 5\' 11"  (1.803 m)  Wt 318 lb 12.8 oz (144.607 kg)  BMI 44.48 kg/m2  SpO2 97% Wt Readings from Last 3 Encounters:  09/12/15 318 lb 12.8 oz (144.607 kg)  09/01/15 315 lb 12.8 oz (143.246 kg)  04/25/15 307 lb (139.254 kg)     Lab Results  Component Value Date   WBC 5.1 09/01/2015   HGB 13.6 09/01/2015   HCT 40.8 09/01/2015   PLT 267.0 09/01/2015   GLUCOSE 118* 09/01/2015   GLUCOSE 118* 09/01/2015   CHOL 123 09/01/2015   TRIG 136.0 09/01/2015   HDL 34.00* 09/01/2015   LDLCALC 62 09/01/2015   ALT 18 09/01/2015   AST 13 09/01/2015   NA 141 09/01/2015   NA 141 09/01/2015   K 3.8 09/01/2015   K 3.8 09/01/2015   CL 106 09/01/2015   CL 106 09/01/2015   CREATININE 0.78 09/01/2015   CREATININE 0.78 09/01/2015   BUN 14 09/01/2015   BUN 14 09/01/2015   CO2 26 09/01/2015   CO2 26 09/01/2015   INR 1.14 03/31/2012   HGBA1C 8.0* 09/01/2015    Dg Tibia/fibula Right  09/01/2015  CLINICAL DATA:  Right leg pain.  History of infection. EXAM: RIGHT TIBIA AND FIBULA - 2 VIEW COMPARISON:  None. FINDINGS: There is no evidence of fracture or other focal bone lesions. Soft tissues are unremarkable. IMPRESSION: Negative. Electronically Signed   By: Franchot Gallo M.D.   On: 09/01/2015 15:53     Assessment & Plan:  Plan I am having [name] maintain his pravastatin, hydrOXYzine, metFORMIN, losartan, traMADol, amLODipine, clindamycin, LORazepam, lamoTRIgine, lisinopril, glipiZIDE, gabapentin, and doxycycline.  Meds ordered this encounter  Medications  . doxycycline (VIBRA-TABS) 100 MG tablet    Sig: Take 1 tablet (100 mg total) by mouth 2 (two) times daily.    Dispense:  10 tablet    Refill:  0    Problem List Items Addressed This Visit    None    Visit Diagnoses    Rash and nonspecific skin eruption    -   Primary    Right leg pain        Relevant Medications    doxycycline (VIBRA-TABS) 100 MG tablet    Hx MRSA infection        Relevant Medications    doxycycline (VIBRA-TABS) 100 MG tablet    Testosterone deficiency        Relevant Orders    Testos,Total,Free and SHBG (Male)       Follow-up: Return if symptoms worsen or fail to improve.  Garnet Koyanagi, DO

## 2015-09-12 NOTE — Patient Instructions (Signed)
Community-Associated MRSA CA-MRSA stands for community-associated methicillin-resistant Staphylococcus aureus. MRSA is a type of bacteria that is resistant to some common antibiotic medicines. It can cause infections in the skin and many other places in the body. Staphylococcus aureus, which is often called staph, is a bacterium that normally lives on the skin or in the nose of some people. Staph on the surface of the skin or in the nose does not cause problems. However, if the staph enters the body through a cut, wound, or break in the skin, an infection can happen. Until recently, infections with the MRSA type of staph occurred mainly in hospitals and other health care settings. Now there are increasing problems with MRSA infections in the community as well. Infections with MRSA may be very serious or even life-threatening. CAUSES  All staph bacteria, including MRSA:  Are normally harmless unless they enter the body through a scratch, cut, or wound, such as with surgery.  Can be spread from person to person by touching contaminated objects or through direct contact.  Cases of MRSA diseases in the community have been associated with:  Recent antibiotic use.  Sharing contaminated towels or clothes.  Having active skin diseases.  Participating in contact sports.  Living in crowded settings.  IV drug use.  Community-associated MRSA infections are usually skin infections, but they may cause other severe illnesses, such as:  Pneumonia.  Bone or joint infections.  Bloodstream infections (sepsis). SYMPTOMS This condition usually starts with a skin infection. Signs and symptoms may vary and can include:  An infected area of skin that is red and swollen, feels painful, and is warm to the touch.  Pus under the skin or pus draining from the infected area.  Fever. DIAGNOSIS This condition is diagnosed by cultures of fluid samples that may come from:  Swabs that are taken from cuts or  wounds in infected areas.  Nasal swabs.  Saliva or deep-cough specimens from the lungs (sputum).  Urine.  Blood. TREATMENT  Treatment varies and is based on how serious, how deep, or how extensive the infection is. For example:  Some skin infections, such as a small boil or abscess, may be treated by draining pus from the site of the infection.  Deeper or more widespread soft tissue infections are usually treated with surgery to drain pus and with antibiotics that are given through a vein or by mouth. This may be recommended even if you are pregnant.  Serious infections may require a hospital stay. If antibiotics are prescribed, they may be needed for several weeks. HOME CARE INSTRUCTIONS  Take your antibiotics as directed by your health care provider. Finish the antibiotic even if you start to feel better.  Avoid close contact with people around you as much as possible. Do not use towels, razors, toothbrushes, bedding, or other items that will be used by others.  To fight the infection, follow your health care provider's instructions for wound care. Wash your hands before and after changing your bandages (dressings).  If you have an intravascular device, such as a catheter or port, make sure that you know how to care for it. This might include:  Keeping the skin around the area clean.  Avoiding having your blood pressure taken on the side where the catheter is located.  Knowing when to report any suspected problems to your health care provider.  Be sure to tell any health care providers who care for you that you have MRSA so they are aware of your   infection. PREVENTION  Wash your hands frequently with soap and water for at least 15 seconds. If soap and water are not available, use alcohol-based hand disinfectants.  Make sure that people who live with you wash their hands often, too.  Do not share personal items. For example, avoid sharing razors and other personal hygiene  items, towels, clothing, and athletic equipment.  Wash and dry your clothes and bedding at the warmest temperatures that are recommended on the labels.  Keep wounds covered. Pus from infected sores may contain MRSA and other bacteria. Keep cuts and abrasions clean and covered with germ-free (sterile), dry bandages until they are healed.  If you have a wound that appears infected, ask your health care provider if a culture should be done for MRSA and other bacteria.  If you are breastfeeding, talk to your health care provider about MRSA. You may be asked to temporarily stop breastfeeding. SEEK IMMEDIATE MEDICAL CARE IF:  The infection appears to be getting worse. Signs include:  Increased warmth, redness, or tenderness around the wound site.  A red line that extends from the infection site.  A dark color in the area around the infection.  Wound drainage that is tan, yellow, or green.  A bad smell coming from the wound.  You feel nauseous, you vomit, or you cannot keep medicine down.  You have a fever.  You have difficulty breathing.   This information is not intended to replace advice given to you by your health care provider. Make sure you discuss any questions you have with your health care provider.   Document Released: 11/09/2005 Document Revised: 08/27/2014 Document Reviewed: 11/09/2010 Elsevier Interactive Patient Education 2016 [employer]  

## 2015-09-14 ENCOUNTER — Encounter: Payer: Self-pay | Admitting: Family Medicine

## 2015-09-14 ENCOUNTER — Telehealth: Payer: Self-pay | Admitting: Family Medicine

## 2015-09-14 NOTE — Telephone Encounter (Signed)
Caller name: Self  Can be [phone]   Reason for call: Patient would like results of labs done Monday 1/23

## 2015-09-15 MED ORDER — FUROSEMIDE 20 MG PO TABS: 20.0000 mg | ORAL_TABLET | Freq: Every day | ORAL | Status: DC

## 2015-09-15 NOTE — Telephone Encounter (Signed)
Lasix 20 mg 1 po qam  #30  2 refills--- ov in 2 weeks to check swelling and bmp

## 2015-09-16 LAB — TESTOS,TOTAL,FREE AND SHBG (FEMALE)
Sex Hormone Binding Glob.: 35 nmol/L (ref 10–50)
Testosterone, Free: 47.9 pg/mL (ref 35.0–155.0)
Testosterone,Total,LC/MS/MS: 377 ng/dL (ref 250–1100)

## 2015-09-16 NOTE — Telephone Encounter (Signed)
Message left to call the office.    KP 

## 2015-09-16 NOTE — Telephone Encounter (Signed)
Spoke with patient and he wanted the results of the Testosterone,  I made his aware they are not available at this time.  I will have the lab check the status.    KP

## 2015-09-19 ENCOUNTER — Institutional Professional Consult (permissible substitution): Payer: BLUE CROSS/BLUE SHIELD | Admitting: Pulmonary Disease

## 2015-09-20 ENCOUNTER — Encounter: Payer: Self-pay | Admitting: Family Medicine

## 2015-09-20 DIAGNOSIS — R7989 Other specified abnormal findings of blood chemistry: Secondary | ICD-10-CM

## 2015-09-20 NOTE — Telephone Encounter (Signed)
I'm not sure what he is requesting but I'm happy to refer him to a urology to get back on the shots if that is what he is requesting.

## 2015-09-22 ENCOUNTER — Encounter: Payer: Self-pay | Admitting: Family Medicine

## 2015-09-26 ENCOUNTER — Other Ambulatory Visit: Payer: Self-pay | Admitting: Family Medicine

## 2015-09-26 ENCOUNTER — Encounter: Payer: Self-pay | Admitting: Family Medicine

## 2015-09-26 DIAGNOSIS — R21 Rash and other nonspecific skin eruption: Secondary | ICD-10-CM

## 2015-09-26 MED ORDER — METOPROLOL SUCCINATE ER 50 MG PO TB24: 50.0000 mg | ORAL_TABLET | Freq: Every day | ORAL | Status: DC

## 2015-09-26 NOTE — Telephone Encounter (Signed)
He is on Norvasc 10. Please advise     KP

## 2015-09-26 NOTE — Telephone Encounter (Signed)
Can we get urgent referral to derm - I'll change the losartan to norvasc 5 mg Recheck 2 weeks

## 2015-09-26 NOTE — Telephone Encounter (Signed)
Tyler Chessman, DO  Rudene Anda, RN           Pt is already on norvasc 10  D/c losartan  Att toprol xl 50 mg daily  Ov in 2 weeks

## 2015-09-26 NOTE — Telephone Encounter (Signed)
hes already on 10 mg norvasc-- I sent you a staff message after I sent this message

## 2015-09-26 NOTE — Telephone Encounter (Signed)
I did not get a staff message.    KP

## 2015-09-26 NOTE — Telephone Encounter (Signed)
My-chart response sent to the patient.     KP

## 2015-09-26 NOTE — Telephone Encounter (Signed)
Can we get him in with derm urgently

## 2015-09-27 ENCOUNTER — Encounter: Payer: Self-pay | Admitting: Family Medicine

## 2015-09-29 ENCOUNTER — Encounter: Payer: Self-pay | Admitting: Family Medicine

## 2015-09-29 ENCOUNTER — Ambulatory Visit (INDEPENDENT_AMBULATORY_CARE_PROVIDER_SITE_OTHER): Payer: BLUE CROSS/BLUE SHIELD | Admitting: Family Medicine

## 2015-09-29 ENCOUNTER — Other Ambulatory Visit: Payer: BLUE CROSS/BLUE SHIELD

## 2015-09-29 VITALS — BP 140/86 | HR 78 | Temp 98.5°F | Ht 71.0 in | Wt 326.4 lb

## 2015-09-29 DIAGNOSIS — F988 Other specified behavioral and emotional disorders with onset usually occurring in childhood and adolescence: Secondary | ICD-10-CM

## 2015-09-29 DIAGNOSIS — R21 Rash and other nonspecific skin eruption: Secondary | ICD-10-CM

## 2015-09-29 DIAGNOSIS — R632 Polyphagia: Secondary | ICD-10-CM | POA: Diagnosis not present

## 2015-09-29 DIAGNOSIS — F909 Attention-deficit hyperactivity disorder, unspecified type: Secondary | ICD-10-CM | POA: Diagnosis not present

## 2015-09-29 MED ORDER — LISDEXAMFETAMINE DIMESYLATE 30 MG PO CAPS: 30.0000 mg | ORAL_CAPSULE | Freq: Every day | ORAL | Status: DC

## 2015-09-29 MED ORDER — TRIAMCINOLONE ACETONIDE 0.1 % EX CREA: 1.0000 "application " | TOPICAL_CREAM | Freq: Two times a day (BID) | CUTANEOUS | Status: DC

## 2015-09-29 NOTE — Assessment & Plan Note (Signed)
vyvanse  rto 3 months

## 2015-09-29 NOTE — Patient Instructions (Signed)
Attention Deficit Hyperactivity Disorder Attention deficit hyperactivity disorder (ADHD) is a problem with behavior issues based on the way the brain functions (neurobehavioral disorder). It is a common reason for behavior and academic problems in school. SYMPTOMS  There are 3 types of ADHD. The 3 types and some of the symptoms include:  Inattentive.  Gets bored or distracted easily.  Loses or forgets things. Forgets to hand in homework.  Has trouble organizing or completing tasks.  Difficulty staying on task.  An inability to organize daily tasks and school work.  Leaving projects, chores, or homework unfinished.  Trouble paying attention or responding to details. Careless mistakes.  Difficulty following directions. Often seems like is not listening.  Dislikes activities that require sustained attention (like chores or homework).  Hyperactive-impulsive.  Feels like it is impossible to sit still or stay in a seat. Fidgeting with hands and feet.  Trouble waiting turn.  Talking too much or out of turn. Interruptive.  Speaks or acts impulsively.  Aggressive, disruptive behavior.  Constantly busy or on the go; noisy.  Often leaves seat when they are expected to remain seated.  Often runs or climbs where it is not appropriate, or feels very restless.  Combined.  Has symptoms of both of the above. Often children with ADHD feel discouraged about themselves and with school. They often perform well below their abilities in school. As children get older, the excess motor activities can calm down, but the problems with paying attention and staying organized persist. Most children do not outgrow ADHD but with good treatment can learn to cope with the symptoms. DIAGNOSIS  When ADHD is suspected, the diagnosis should be made by professionals trained in ADHD. This professional will collect information about the individual suspected of having ADHD. Information must be collected from  various settings where the person lives, works, or attends school.  Diagnosis will include:  Confirming symptoms began in childhood.  Ruling out other reasons for the child's behavior.  The health care providers will check with the child's school and check their medical records.  They will talk to teachers and parents.  Behavior rating scales for the child will be filled out by those dealing with the child on a daily basis. A diagnosis is made only after all information has been considered. TREATMENT  Treatment usually includes behavioral treatment, tutoring or extra support in school, and stimulant medicines. Because of the way a person's brain works with ADHD, these medicines decrease impulsivity and hyperactivity and increase attention. This is different than how they would work in a person who does not have ADHD. Other medicines used include antidepressants and certain blood pressure medicines. Most experts agree that treatment for ADHD should address all aspects of the person's functioning. Along with medicines, treatment should include structured classroom management at school. Parents should reward good behavior, provide constant discipline, and set limits. Tutoring should be available for the child as needed. ADHD is a lifelong condition. If untreated, the disorder can have long-term serious effects into adolescence and adulthood. HOME CARE INSTRUCTIONS   Often with ADHD there is a lot of frustration among family members dealing with the condition. Blame and anger are also feelings that are common. In many cases, because the problem affects the family as a whole, the entire family may need help. A therapist can help the family find better ways to handle the disruptive behaviors of the person with ADHD and promote change. If the person with ADHD is young, most of the therapist's   and anger are also feelings that are common. In many cases, because the problem affects the family as a whole, the entire family may need help. A therapist can help the family find better ways to handle the disruptive behaviors of the person with ADHD and promote change. If the person with ADHD is young, most of the therapist's work is with the parents. Parents will learn techniques for coping with and improving their child's behavior.  Sometimes only the child with the ADHD needs counseling. Your health care providers can help you make these decisions.  · Children with ADHD may need help learning how to organize. Some helpful tips include:  ¨ Keep routines the same every day from wake-up time to bedtime. Schedule all activities, including homework and playtime. Keep the schedule in a place where the person with ADHD will often see it. Mark schedule changes as far in advance as possible.  ¨ Schedule outdoor and indoor recreation.  ¨ Have a place for everything and keep everything in its place. This includes clothing, backpacks, and school supplies.  ¨ Encourage writing down assignments and bringing home needed books. Work with your child's teachers for assistance in organizing school work.  · Offer your child a well-balanced diet. Breakfast that includes a balance of whole grains, protein, and fruits or vegetables is especially important for school performance. Children should avoid drinks with caffeine including:  ¨ Soft drinks.  ¨ Coffee.  ¨ Tea.  ¨ However, some older children (adolescents) may find these drinks helpful in improving their attention. Because it can also be common for adolescents with ADHD to become addicted to caffeine, talk with your health care provider about what is a safe amount of caffeine intake for your child.  · Children with ADHD need consistent rules that they can understand and follow. If rules are followed, give small rewards. Children with ADHD often receive, and expect, criticism. Look for good behavior and praise it. Set realistic goals. Give clear instructions. Look for activities that can foster success and self-esteem. Make time for pleasant activities with your child. Give lots of affection.  · Parents are their children's greatest advocates. Learn as much as possible about ADHD. This helps you become a stronger and better advocate for your child. It also helps you educate your child's teachers and instructors  if they feel inadequate in these areas. Parent support groups are often helpful. A national group with local chapters is called Children and Adults with Attention Deficit Hyperactivity Disorder (CHADD).  SEEK MEDICAL CARE IF:  · Your child has repeated muscle twitches, cough, or speech outbursts.  · Your child has sleep problems.  · Your child has a marked loss of appetite.  · Your child develops depression.  · Your child has new or worsening behavioral problems.  · Your child develops dizziness.  · Your child has a racing heart.  · Your child has stomach pains.  · Your child develops headaches.  SEEK IMMEDIATE MEDICAL CARE IF:  · Your child has been diagnosed with depression or anxiety and the symptoms seem to be getting worse.  · Your child has been depressed and suddenly appears to have increased energy or motivation.  · You are worried that your child is having a bad reaction to a medication he or she is taking for ADHD.     This information is not intended to replace advice given to you by your health care provider. Make sure you discuss any questions you have with your

## 2015-09-29 NOTE — Progress Notes (Signed)
Pre visit review using our clinic review tool, if applicable. No additional management support is needed unless otherwise documented below in the visit note. 

## 2015-09-29 NOTE — Assessment & Plan Note (Signed)
vyvanse  rto 6 months

## 2015-09-29 NOTE — Progress Notes (Signed)
Patient ID: Tyler Brewer, male    DOB: May 29, 1961  Age: 55 y.o. MRN: [id number]    Subjective:  Subjective HPI Tyler Brewer presents for f/u cellulitis-- it has spread to his eyelids and itches.  Pt is also c/o ADd and was dx in 1997 by psych but his psych now does not want to rx meds.  He is also c/o binge eating and struggling to lose weight.      Review of Systems  Constitutional: Negative for diaphoresis, appetite change, fatigue and unexpected weight change.  Eyes: Negative for pain, redness and visual disturbance.  Respiratory: Negative for cough, chest tightness, shortness of breath and wheezing.   Cardiovascular: Negative for chest pain, palpitations and leg swelling.  Endocrine: Negative for cold intolerance, heat intolerance, polydipsia, polyphagia and polyuria.  Genitourinary: Negative for dysuria, frequency and difficulty urinating.  Neurological: Negative for dizziness, light-headedness, numbness and headaches.  Psychiatric/Behavioral: Positive for dysphoric mood and decreased concentration. Negative for suicidal ideas, sleep disturbance and self-injury. The patient is nervous/anxious and is hyperactive.     History Past Medical History  Diagnosis Date  . Hypertension   . Diabetes mellitus     poorly controlled by his report  . IBS (irritable bowel syndrome)   . History of narcotic addiction ([place])     past history of back pain  . Hypercholesteremia   . Testosterone deficiency   . Depression   . Obesity     Max weight was 390  . CAD (coronary artery disease)  cardiac cath with moderate disease in a septal branch of the ramus intermedius 04/01/2012  . Panic attacks   . OSA on CPAP     He has past surgical history that includes Cardiac catheterization and left heart catheterization with coronary angiogram (N/A, 03/31/2012).   His family history is not on file.He reports that he has never smoked. He does not have any smokeless tobacco history on file. He reports  that he does not drink alcohol or use illicit drugs.  Current Outpatient Prescriptions on File Prior to Visit  Medication Sig Dispense Refill  . amLODipine (NORVASC) 10 MG tablet Take 10 mg by mouth daily.    . clindamycin (CLEOCIN) 300 MG capsule Take 300 mg by mouth 4 (four) times daily.    . furosemide (LASIX) 20 MG tablet Take 1 tablet (20 mg total) by mouth daily. 30 tablet 2  . gabapentin (NEURONTIN) 300 MG capsule Take 1 capsule (300 mg total) by mouth 2 (two) times daily. 60 capsule 2  . glipiZIDE (GLUCOTROL) 10 MG tablet Take 1 tablet (10 mg total) by mouth 2 (two) times daily before a meal. 60 tablet 2  . hydrOXYzine (ATARAX/VISTARIL) 50 MG tablet Take 50 mg by mouth 3 (three) times daily as needed for anxiety.    . lamoTRIgine (LAMICTAL) 100 MG tablet Take 50 mg by mouth daily.    [name] Kitchen LORazepam (ATIVAN) 1 MG tablet Take 1 mg by mouth 3 (three) times daily as needed for anxiety.    . metFORMIN (GLUCOPHAGE) 1000 MG tablet Take 1,000 mg by mouth 2 (two) times daily with a meal.    . metoprolol succinate (TOPROL-XL) 50 MG 24 hr tablet Take 1 tablet (50 mg total) by mouth daily. Take with or immediately following a meal. 30 tablet 0  . pravastatin (PRAVACHOL) 10 MG tablet Take 10 mg by mouth daily.    . traMADol (ULTRAM) 50 MG tablet Take 1 tablet (50 mg total) by mouth every 8 (eight) hours  as needed. 30 tablet 0   No current facility-administered medications on file prior to visit.     Objective:  Objective Physical Exam  Constitutional: He is oriented to person, place, and time. Vital signs are normal. He appears well-developed and well-nourished. He is sleeping.  HENT:  Head: Normocephalic and atraumatic.  Mouth/Throat: Oropharynx is clear and moist.  Eyes: EOM are normal. Pupils are equal, round, and reactive to light.  Neck: Normal range of motion. Neck supple. No thyromegaly present.  Cardiovascular: Normal rate and regular rhythm.   No murmur heard. Pulmonary/Chest: Effort  normal and breath sounds normal. No respiratory distress. He has no wheezes. He has no rales. He exhibits no tenderness.  Musculoskeletal: He exhibits no edema or tenderness.  Neurological: He is alert and oriented to person, place, and time.  Skin: Skin is warm and dry. Rash noted. There is erythema.  Psychiatric: He has a normal mood and affect. His behavior is normal. Judgment and thought content normal.  Nursing note and vitals reviewed.  BP 140/86 mmHg  Pulse 78  Temp(Src) 98.5 F (36.9 C) (Oral)  Ht 5\' 11"  (1.803 m)  Wt 326 lb 6.4 oz (148.054 kg)  BMI 45.54 kg/m2  SpO2 98% Wt Readings from Last 3 Encounters:  09/29/15 326 lb 6.4 oz (148.054 kg)  09/12/15 318 lb 12.8 oz (144.607 kg)  09/01/15 315 lb 12.8 oz (143.246 kg)     Lab Results  Component Value Date   WBC 5.1 09/01/2015   HGB 13.6 09/01/2015   HCT 40.8 09/01/2015   PLT 267.0 09/01/2015   GLUCOSE 118* 09/01/2015   GLUCOSE 118* 09/01/2015   CHOL 123 09/01/2015   TRIG 136.0 09/01/2015   HDL 34.00* 09/01/2015   LDLCALC 62 09/01/2015   ALT 18 09/01/2015   AST 13 09/01/2015   NA 141 09/01/2015   NA 141 09/01/2015   K 3.8 09/01/2015   K 3.8 09/01/2015   CL 106 09/01/2015   CL 106 09/01/2015   CREATININE 0.78 09/01/2015   CREATININE 0.78 09/01/2015   BUN 14 09/01/2015   BUN 14 09/01/2015   CO2 26 09/01/2015   CO2 26 09/01/2015   INR 1.14 03/31/2012   HGBA1C 8.0* 09/01/2015    Dg Tibia/fibula Right  09/01/2015  CLINICAL DATA:  Right leg pain.  History of infection. EXAM: RIGHT TIBIA AND FIBULA - 2 VIEW COMPARISON:  None. FINDINGS: There is no evidence of fracture or other focal bone lesions. Soft tissues are unremarkable. IMPRESSION: Negative. Electronically Signed   By: Franchot Gallo M.D.   On: 09/01/2015 15:53     Assessment & Plan:  Plan I have discontinued Mr. Holdren doxycycline. I am also having him start on triamcinolone cream and lisdexamfetamine. Additionally, I am having him maintain his  pravastatin, hydrOXYzine, metFORMIN, traMADol, amLODipine, clindamycin, LORazepam, lamoTRIgine, glipiZIDE, gabapentin, furosemide, metoprolol succinate, and testosterone.  Meds ordered this encounter  Medications  . testosterone (ANDROGEL) 50 MG/5GM (1%) GEL    Sig: Place 5 g onto the skin daily.  [name] Kitchen triamcinolone cream (KENALOG) 0.1 %    Sig: Apply 1 application topically 2 (two) times daily.    Dispense:  30 g    Refill:  0  . lisdexamfetamine (VYVANSE) 30 MG capsule    Sig: Take 1 capsule (30 mg total) by mouth daily.    Dispense:  30 capsule    Refill:  0    Problem List Items Addressed This Visit      Unprioritized  Binge eating    vyvanse  rto 3 months      ADD (attention deficit disorder) - Primary    vyvanse  rto 6 months      Relevant Medications   lisdexamfetamine (VYVANSE) 30 MG capsule    Other Visit Diagnoses    Rash and nonspecific skin eruption        Relevant Medications    triamcinolone cream (KENALOG) 0.1 %       Follow-up: Return in about 6 months (around 03/28/2016), or if symptoms worsen or fail to improve.  Garnet Koyanagi, DO

## 2015-09-30 ENCOUNTER — Encounter: Payer: Self-pay | Admitting: Family Medicine

## 2015-09-30 ENCOUNTER — Other Ambulatory Visit: Payer: Self-pay | Admitting: Family Medicine

## 2015-09-30 DIAGNOSIS — M79604 Pain in right leg: Secondary | ICD-10-CM

## 2015-09-30 MED ORDER — TRAMADOL HCL 50 MG PO TABS: 50.0000 mg | ORAL_TABLET | Freq: Three times a day (TID) | ORAL | Status: DC | PRN

## 2015-09-30 NOTE — Telephone Encounter (Signed)
Last seen 09/29/15 and filled 09/01/15 #30   Please advise    KP

## 2015-10-03 ENCOUNTER — Telehealth: Payer: Self-pay | Admitting: Family Medicine

## 2015-10-03 NOTE — Telephone Encounter (Signed)
Pt called to f/u on PA for Vyvanse. He said [place], [place] [address] faxed [place] the information for PA on Thursday 09/29/15.

## 2015-10-03 NOTE — Telephone Encounter (Signed)
No PA request and/or insurance information received/SLS 02/13

## 2015-10-04 ENCOUNTER — Encounter: Payer: Self-pay | Admitting: Family Medicine

## 2015-10-04 NOTE — Telephone Encounter (Signed)
Received PA fax request for Vyvanse, CMM key [id number]/SLS 02/14

## 2015-10-05 ENCOUNTER — Encounter: Payer: Self-pay | Admitting: Family Medicine

## 2015-10-05 NOTE — Telephone Encounter (Signed)
PA for vyvanse began in covermymeds

## 2015-10-10 ENCOUNTER — Encounter: Payer: Self-pay | Admitting: Pulmonary Disease

## 2015-10-10 NOTE — Telephone Encounter (Signed)
PA for vyvanse Effective from 10/05/2015 through 08/19/2038. Pt informed.
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
## 2015-10-13 ENCOUNTER — Ambulatory Visit (INDEPENDENT_AMBULATORY_CARE_PROVIDER_SITE_OTHER): Payer: BLUE CROSS/BLUE SHIELD | Admitting: Family Medicine

## 2015-10-13 ENCOUNTER — Encounter: Payer: Self-pay | Admitting: Family Medicine

## 2015-10-13 VITALS — BP 136/80 | HR 82 | Temp 98.5°F | Ht 71.0 in | Wt 328.6 lb

## 2015-10-13 DIAGNOSIS — E1143 Type 2 diabetes mellitus with diabetic autonomic (poly)neuropathy: Secondary | ICD-10-CM

## 2015-10-13 DIAGNOSIS — G4730 Sleep apnea, unspecified: Secondary | ICD-10-CM

## 2015-10-13 DIAGNOSIS — R600 Localized edema: Secondary | ICD-10-CM

## 2015-10-13 DIAGNOSIS — F909 Attention-deficit hyperactivity disorder, unspecified type: Secondary | ICD-10-CM

## 2015-10-13 DIAGNOSIS — E11620 Type 2 diabetes mellitus with diabetic dermatitis: Secondary | ICD-10-CM | POA: Diagnosis not present

## 2015-10-13 DIAGNOSIS — E785 Hyperlipidemia, unspecified: Secondary | ICD-10-CM

## 2015-10-13 DIAGNOSIS — IMO0002 Reserved for concepts with insufficient information to code with codable children: Secondary | ICD-10-CM

## 2015-10-13 DIAGNOSIS — R632 Polyphagia: Secondary | ICD-10-CM

## 2015-10-13 DIAGNOSIS — F988 Other specified behavioral and emotional disorders with onset usually occurring in childhood and adolescence: Secondary | ICD-10-CM

## 2015-10-13 DIAGNOSIS — E1165 Type 2 diabetes mellitus with hyperglycemia: Secondary | ICD-10-CM

## 2015-10-13 MED ORDER — DULAGLUTIDE 0.75 MG/0.5ML ~~LOC~~ SOAJ: SUBCUTANEOUS | Status: DC

## 2015-10-13 MED ORDER — FUROSEMIDE 40 MG PO TABS: 40.0000 mg | ORAL_TABLET | Freq: Every day | ORAL | Status: DC

## 2015-10-13 NOTE — Progress Notes (Signed)
Pre visit review using our clinic review tool, if applicable. No additional management support is needed unless otherwise documented below in the visit note. 

## 2015-10-13 NOTE — Patient Instructions (Signed)

## 2015-10-14 NOTE — Assessment & Plan Note (Signed)
con't vyvanse rto 6 months 

## 2015-10-14 NOTE — Assessment & Plan Note (Signed)
con't pravastatin D/w pt diet and exercise

## 2015-10-14 NOTE — Assessment & Plan Note (Signed)
con't vyvanse rto 6 months or sooner prn

## 2015-10-14 NOTE — Progress Notes (Signed)
Patient ID: Tyler Brewer, male    DOB: September 01, 1960  Age: 55 y.o. MRN: [id number]    Subjective:  Subjective HPI Tyler Brewer presents for f/o bp, cholesterol and dm. He c/o of his neuropathy causing increased pain.  The gapepentin is just making it worse.   HPI HYPERTENSION  Blood pressure range-not checking  Chest pain- no      Dyspnea- no Lightheadedness- no   Edema- yes Other side effects - no   Medication compliance: good Low salt diet- yes  DIABETES  Blood Sugar ranges-good per pt  Polyuria- no New Visual problems- no Hypoglycemic symptoms- no Other side effects-no Medication compliance - good Last eye exam- due Foot exam- today  HYPERLIPIDEMIA  Medication compliance- good RUQ pain- no  Muscle aches- no Other side effects-no     Review of Systems  Constitutional: Negative for diaphoresis, appetite change, fatigue and unexpected weight change.  Eyes: Negative for pain, redness and visual disturbance.  Respiratory: Negative for cough, chest tightness, shortness of breath and wheezing.   Cardiovascular: Negative for chest pain, palpitations and leg swelling.  Endocrine: Negative for cold intolerance, heat intolerance, polydipsia, polyphagia and polyuria.  Genitourinary: Negative for dysuria, frequency and difficulty urinating.  Neurological: Negative for dizziness, light-headedness, numbness and headaches.    History Past Medical History  Diagnosis Date  . Hypertension   . Diabetes mellitus     poorly controlled by his report  . IBS (irritable bowel syndrome)   . History of narcotic addiction ([place])     past history of back pain  . Hypercholesteremia   . Testosterone deficiency   . Depression   . Obesity     Max weight was 390  . CAD (coronary artery disease)  cardiac cath with moderate disease in a septal branch of the ramus intermedius 04/01/2012  . Panic attacks   . OSA on CPAP     He has past surgical history that includes Cardiac  catheterization and left heart catheterization with coronary angiogram (N/A, 03/31/2012).   His family history is not on file.He reports that he has never smoked. He does not have any smokeless tobacco history on file. He reports that he does not drink alcohol or use illicit drugs.  Current Outpatient Prescriptions on File Prior to Visit  Medication Sig Dispense Refill  . amLODipine (NORVASC) 10 MG tablet Take 10 mg by mouth daily.    . clindamycin (CLEOCIN) 300 MG capsule Take 300 mg by mouth 4 (four) times daily.    [name] Kitchen gabapentin (NEURONTIN) 300 MG capsule Take 1 capsule (300 mg total) by mouth 2 (two) times daily. 60 capsule 2  . glipiZIDE (GLUCOTROL) 10 MG tablet Take 1 tablet (10 mg total) by mouth 2 (two) times daily before a meal. 60 tablet 2  . hydrOXYzine (ATARAX/VISTARIL) 50 MG tablet Take 50 mg by mouth 3 (three) times daily as needed for anxiety.    [name] Kitchen lisdexamfetamine (VYVANSE) 30 MG capsule Take 1 capsule (30 mg total) by mouth daily. 30 capsule 0  . LORazepam (ATIVAN) 1 MG tablet Take 1 mg by mouth 3 (three) times daily as needed for anxiety.    . metFORMIN (GLUCOPHAGE) 1000 MG tablet Take 1,000 mg by mouth 2 (two) times daily with a meal.    . metoprolol succinate (TOPROL-XL) 50 MG 24 hr tablet Take 1 tablet (50 mg total) by mouth daily. Take with or immediately following a meal. 30 tablet 0  . pravastatin (PRAVACHOL) 10 MG tablet Take 10 mg by  mouth daily.    [name] Kitchen testosterone (ANDROGEL) 50 MG/5GM (1%) GEL Place 5 g onto the skin daily.    . traMADol (ULTRAM) 50 MG tablet Take 1 tablet (50 mg total) by mouth every 8 (eight) hours as needed. 30 tablet 0  . triamcinolone cream (KENALOG) 0.1 % Apply 1 application topically 2 (two) times daily. 30 g 0   No current facility-administered medications on file prior to visit.     Objective:  Objective Physical Exam  Constitutional: He is oriented to person, place, and time. Vital signs are normal. He appears well-developed and  well-nourished. He is sleeping.  HENT:  Head: Normocephalic and atraumatic.  Mouth/Throat: Oropharynx is clear and moist.  Eyes: EOM are normal. Pupils are equal, round, and reactive to light.  Neck: Normal range of motion. Neck supple. No thyromegaly present.  Cardiovascular: Normal rate and regular rhythm.   No murmur heard. Pulmonary/Chest: Effort normal and breath sounds normal. No respiratory distress. He has no wheezes. He has no rales. He exhibits no tenderness.  Musculoskeletal: He exhibits edema. He exhibits no tenderness.  Neurological: He is alert and oriented to person, place, and time.  Skin: Skin is warm and dry.  Psychiatric: He has a normal mood and affect. His behavior is normal. Judgment and thought content normal.  Nursing note and vitals reviewed. Sensory exam of the foot is normal, tested with the monofilament. Good pulses, no lesions or ulcers, good peripheral pulses.  BP 136/80 mmHg  Pulse 82  Temp(Src) 98.5 F (36.9 C) (Oral)  Ht 5\' 11"  (1.803 m)  Wt 328 lb 9.6 oz (149.052 kg)  BMI 45.85 kg/m2  SpO2 98% Wt Readings from Last 3 Encounters:  10/13/15 328 lb 9.6 oz (149.052 kg)  09/29/15 326 lb 6.4 oz (148.054 kg)  09/12/15 318 lb 12.8 oz (144.607 kg)     Lab Results  Component Value Date   WBC 5.1 09/01/2015   HGB 13.6 09/01/2015   HCT 40.8 09/01/2015   PLT 267.0 09/01/2015   GLUCOSE 118* 09/01/2015   GLUCOSE 118* 09/01/2015   CHOL 123 09/01/2015   TRIG 136.0 09/01/2015   HDL 34.00* 09/01/2015   LDLCALC 62 09/01/2015   ALT 18 09/01/2015   AST 13 09/01/2015   NA 141 09/01/2015   NA 141 09/01/2015   K 3.8 09/01/2015   K 3.8 09/01/2015   CL 106 09/01/2015   CL 106 09/01/2015   CREATININE 0.78 09/01/2015   CREATININE 0.78 09/01/2015   BUN 14 09/01/2015   BUN 14 09/01/2015   CO2 26 09/01/2015   CO2 26 09/01/2015   INR 1.14 03/31/2012   HGBA1C 8.0* 09/01/2015    Dg Tibia/fibula Right  09/01/2015  CLINICAL DATA:  Right leg pain.  History of  infection. EXAM: RIGHT TIBIA AND FIBULA - 2 VIEW COMPARISON:  None. FINDINGS: There is no evidence of fracture or other focal bone lesions. Soft tissues are unremarkable. IMPRESSION: Negative. Electronically Signed   By: Franchot Gallo M.D.   On: 09/01/2015 15:53     Assessment & Plan:  Plan I have discontinued Mr. Pasos lamoTRIgine and furosemide. I am also having him start on furosemide and Dulaglutide. Additionally, I am having him maintain his pravastatin, hydrOXYzine, metFORMIN, amLODipine, clindamycin, LORazepam, glipiZIDE, gabapentin, metoprolol succinate, testosterone, triamcinolone cream, lisdexamfetamine, and traMADol.  Meds ordered this encounter  Medications  . furosemide (LASIX) 40 MG tablet    Sig: Take 1 tablet (40 mg total) by mouth daily.    Dispense:  30 tablet    Refill:  3  . Dulaglutide (TRULICITY) [id number] SOPN    Sig: 0.75 mg weekly sq    Dispense:  0.5 mL    Refill:  5    Problem List Items Addressed This Visit      Unprioritized   Sleep apnea, on C-pap (Chronic)    pulm pending      Dyslipidemia (Chronic)    con't pravastatin D/w pt diet and exercise      Binge eating    con't vyvanse rto 6 months or sooner prn      ADD (attention deficit disorder)    con't vyvanse rto 6 months       Other Visit Diagnoses    Bilateral edema of lower extremity    -  Primary    Relevant Medications    furosemide (LASIX) 40 MG tablet    Diabetic autonomic neuropathy associated with type 2 diabetes mellitus (HCC)        Relevant Medications    Dulaglutide (TRULICITY) [id number] SOPN    Other Relevant Orders    Ambulatory referral to Neurology    Diabetic autonomic neuropathy associated with type 2 diabetes mellitus (HCC)   (Chronic)      Relevant Medications    Dulaglutide (TRULICITY) [id number] SOPN    Other Relevant Orders    Ambulatory referral to Neurology    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  Garnet Koyanagi, DO

## 2015-10-14 NOTE — Assessment & Plan Note (Signed)
pulm pending

## 2015-10-17 ENCOUNTER — Institutional Professional Consult (permissible substitution): Payer: BLUE CROSS/BLUE SHIELD | Admitting: Pulmonary Disease

## 2015-10-18 ENCOUNTER — Ambulatory Visit: Payer: BLUE CROSS/BLUE SHIELD | Admitting: Family Medicine

## 2015-10-20 ENCOUNTER — Encounter: Payer: Self-pay | Admitting: Family Medicine

## 2015-10-21 ENCOUNTER — Encounter: Payer: Self-pay | Admitting: Family Medicine

## 2015-10-21 NOTE — Telephone Encounter (Signed)
If we have another sample he can have it

## 2015-10-24 ENCOUNTER — Telehealth: Payer: Self-pay | Admitting: *Deleted

## 2015-10-24 ENCOUNTER — Encounter: Payer: Self-pay | Admitting: Family Medicine

## 2015-10-24 ENCOUNTER — Other Ambulatory Visit: Payer: Self-pay | Admitting: Family Medicine

## 2015-10-24 MED ORDER — METOPROLOL SUCCINATE ER 50 MG PO TB24: 50.0000 mg | ORAL_TABLET | Freq: Every day | ORAL | Status: DC

## 2015-10-24 NOTE — Telephone Encounter (Signed)
Initiated PA via Cover My Meds for Trulicity, awaiting decision/SLS 03/06

## 2015-10-26 NOTE — Telephone Encounter (Signed)
Received fax with [employer] on Trulicity; it can only be covered when two alternatives on member's formulary have been tried and failed; "in this case, there is only one alternative medication that must be tried: Victoza/SLS 03/08

## 2015-10-26 NOTE — Telephone Encounter (Signed)
Please inform pt and if he agrees we can change

## 2015-10-28 ENCOUNTER — Encounter: Payer: Self-pay | Admitting: Family Medicine

## 2015-10-28 NOTE — Telephone Encounter (Signed)
Should be seen if taking lasix

## 2015-11-01 ENCOUNTER — Ambulatory Visit (INDEPENDENT_AMBULATORY_CARE_PROVIDER_SITE_OTHER): Payer: BLUE CROSS/BLUE SHIELD | Admitting: Family Medicine

## 2015-11-01 VITALS — BP 134/82 | HR 94 | Temp 98.3°F | Wt 329.8 lb

## 2015-11-01 DIAGNOSIS — F909 Attention-deficit hyperactivity disorder, unspecified type: Secondary | ICD-10-CM | POA: Diagnosis not present

## 2015-11-01 DIAGNOSIS — IMO0002 Reserved for concepts with insufficient information to code with codable children: Secondary | ICD-10-CM

## 2015-11-01 DIAGNOSIS — M79604 Pain in right leg: Secondary | ICD-10-CM

## 2015-11-01 DIAGNOSIS — F988 Other specified behavioral and emotional disorders with onset usually occurring in childhood and adolescence: Secondary | ICD-10-CM

## 2015-11-01 DIAGNOSIS — E1165 Type 2 diabetes mellitus with hyperglycemia: Secondary | ICD-10-CM

## 2015-11-01 DIAGNOSIS — Z1159 Encounter for screening for other viral diseases: Secondary | ICD-10-CM | POA: Diagnosis not present

## 2015-11-01 DIAGNOSIS — R600 Localized edema: Secondary | ICD-10-CM | POA: Diagnosis not present

## 2015-11-01 DIAGNOSIS — C9000 Multiple myeloma not having achieved remission: Secondary | ICD-10-CM

## 2015-11-01 DIAGNOSIS — E1151 Type 2 diabetes mellitus with diabetic peripheral angiopathy without gangrene: Secondary | ICD-10-CM

## 2015-11-01 MED ORDER — TRAMADOL HCL 50 MG PO TABS: 50.0000 mg | ORAL_TABLET | Freq: Three times a day (TID) | ORAL | Status: DC | PRN

## 2015-11-01 MED ORDER — LISDEXAMFETAMINE DIMESYLATE 30 MG PO CAPS: 30.0000 mg | ORAL_CAPSULE | Freq: Every day | ORAL | Status: DC

## 2015-11-01 MED ORDER — GABAPENTIN 300 MG PO CAPS: 300.0000 mg | ORAL_CAPSULE | Freq: Every day | ORAL | Status: DC

## 2015-11-01 MED ORDER — FUROSEMIDE 40 MG PO TABS: 40.0000 mg | ORAL_TABLET | Freq: Two times a day (BID) | ORAL | Status: DC

## 2015-11-01 MED ORDER — LIRAGLUTIDE 18 MG/3ML ~~LOC~~ SOPN: PEN_INJECTOR | SUBCUTANEOUS | Status: DC

## 2015-11-01 NOTE — Patient Instructions (Signed)
Exercising to Lose Weight Exercising can help you to lose weight. In order to lose weight through exercise, you need to do vigorous-intensity exercise. You can tell that you are exercising with vigorous intensity if you are breathing very hard and fast and cannot hold a conversation while exercising. Moderate-intensity exercise helps to maintain your current weight. You can tell that you are exercising at a moderate level if you have a higher heart rate and faster breathing, but you are still able to hold a conversation. HOW OFTEN SHOULD I EXERCISE? Choose an activity that you enjoy and set realistic goals. Your health care provider can help you to make an activity plan that works for you. Exercise regularly as directed by your health care provider. This may include:  Doing resistance training twice each week, such as:  Push-ups.  Sit-ups.  Lifting weights.  Using resistance bands.  Doing a given intensity of exercise for a given amount of time. Choose from these options:  150 minutes of moderate-intensity exercise every week.  75 minutes of vigorous-intensity exercise every week.  A mix of moderate-intensity and vigorous-intensity exercise every week. Children, pregnant women, people who are out of shape, people who are overweight, and older adults may need to consult a health care provider for individual recommendations. If you have any sort of medical condition, be sure to consult your health care provider before starting a new exercise program. WHAT ARE SOME ACTIVITIES THAT CAN HELP ME TO LOSE WEIGHT?   Walking at a rate of at least 4.5 miles an hour.  Jogging or running at a rate of 5 miles per hour.  Biking at a rate of at least 10 miles per hour.  Lap swimming.  Roller-skating or in-line skating.  Cross-country skiing.  Vigorous competitive sports, such as football, basketball, and soccer.  Jumping rope.  Aerobic dancing. HOW CAN I BE MORE ACTIVE IN MY DAY-TO-DAY  ACTIVITIES?  Use the stairs instead of the elevator.  Take a walk during your lunch break.  If you drive, park your car farther away from work or school.  If you take public transportation, get off one stop early and walk the rest of the way.  Make all of your phone calls while standing up and walking around.  Get up, stretch, and walk around every 30 minutes throughout the day. WHAT GUIDELINES SHOULD I FOLLOW WHILE EXERCISING?  Do not exercise so much that you hurt yourself, feel dizzy, or get very short of breath.  Consult your health care provider prior to starting a new exercise program.  Wear comfortable clothes and shoes with good support.  Drink plenty of water while you exercise to prevent dehydration or heat stroke. Body water is lost during exercise and must be replaced.  Work out until you breathe faster and your heart beats faster.   This information is not intended to replace advice given to you by your health care provider. Make sure you discuss any questions you have with your health care provider.   Document Released: 09/08/2010 Document Revised: 08/27/2014 Document Reviewed: 01/07/2014 [employer] Interactive Patient Education 2016 [employer]  

## 2015-11-01 NOTE — Progress Notes (Signed)
+Patient ID: Tyler Brewer, male    DOB: [birth year]  Age: 55 y.o. MRN: [id number]    Subjective:  Subjective HPI Tyler Brewer presents for swelling in ankles since being on gabepentin.   No sob or chest pain, no calf pain.    Review of Systems  Constitutional: Negative for diaphoresis, appetite change, fatigue and unexpected weight change.  Eyes: Negative for pain, redness and visual disturbance.  Respiratory: Negative for cough, chest tightness, shortness of breath and wheezing.   Cardiovascular: Negative for chest pain, palpitations and leg swelling.  Endocrine: Negative for cold intolerance, heat intolerance, polydipsia, polyphagia and polyuria.  Genitourinary: Negative for dysuria, frequency and difficulty urinating.  Neurological: Negative for dizziness, light-headedness, numbness and headaches.    History Past Medical History  Diagnosis Date  . Hypertension   . Diabetes mellitus     poorly controlled by his report  . IBS (irritable bowel syndrome)   . History of narcotic addiction ([place])     past history of back pain  . Hypercholesteremia   . Testosterone deficiency   . Depression   . Obesity     Max weight was 390  . CAD (coronary artery disease)  cardiac cath with moderate disease in a septal branch of the ramus intermedius 04/01/2012  . Panic attacks   . OSA on CPAP     He has past surgical history that includes Cardiac catheterization and left heart catheterization with coronary angiogram (N/A, 03/31/2012).   His family history is not on file.He reports that he has never smoked. He does not have any smokeless tobacco history on file. He reports that he does not drink alcohol or use illicit drugs.  Current Outpatient Prescriptions on File Prior to Visit  Medication Sig Dispense Refill  . amLODipine (NORVASC) 10 MG tablet Take 10 mg by mouth daily.    . clindamycin (CLEOCIN) 300 MG capsule Take 300 mg by mouth 4 (four) times daily.    [name] Kitchen glipiZIDE (GLUCOTROL) 10  MG tablet Take 1 tablet (10 mg total) by mouth 2 (two) times daily before a meal. 60 tablet 2  . hydrOXYzine (ATARAX/VISTARIL) 50 MG tablet Take 50 mg by mouth 3 (three) times daily as needed for anxiety.    [name] Kitchen LORazepam (ATIVAN) 1 MG tablet Take 1 mg by mouth 3 (three) times daily as needed for anxiety.    . metFORMIN (GLUCOPHAGE) 1000 MG tablet Take 1,000 mg by mouth 2 (two) times daily with a meal.    . metoprolol succinate (TOPROL-XL) 50 MG 24 hr tablet Take 1 tablet (50 mg total) by mouth daily. Take with or immediately following a meal. 30 tablet 5  . pravastatin (PRAVACHOL) 10 MG tablet Take 10 mg by mouth daily.    [name] Kitchen testosterone (ANDROGEL) 50 MG/5GM (1%) GEL Place 5 g onto the skin daily.    [name] Kitchen triamcinolone cream (KENALOG) 0.1 % Apply 1 application topically 2 (two) times daily. 30 g 0   No current facility-administered medications on file prior to visit.     Objective:  Objective Physical Exam  Constitutional: He is oriented to person, place, and time. Vital signs are normal. He appears well-developed and well-nourished. He is sleeping.  HENT:  Head: Normocephalic and atraumatic.  Mouth/Throat: Oropharynx is clear and moist.  Eyes: EOM are normal. Pupils are equal, round, and reactive to light.  Neck: Normal range of motion. Neck supple. No thyromegaly present.  Cardiovascular: Normal rate and regular rhythm.   No murmur heard. Pulmonary/Chest: Effort normal  and breath sounds normal. No respiratory distress. He has no wheezes. He has no rales. He exhibits no tenderness.  Musculoskeletal: He exhibits edema. He exhibits no tenderness.  Neurological: He is alert and oriented to person, place, and time.  Skin: Skin is warm and dry.  Psychiatric: He has a normal mood and affect. His behavior is normal. Judgment and thought content normal.  Nursing note and vitals reviewed.  BP 134/82 mmHg  Pulse 94  Temp(Src) 98.3 F (36.8 C) (Oral)  Wt 329 lb 12.8 oz (149.596 kg)  SpO2 98% Wt  Readings from Last 3 Encounters:  11/01/15 329 lb 12.8 oz (149.596 kg)  10/13/15 328 lb 9.6 oz (149.052 kg)  09/29/15 326 lb 6.4 oz (148.054 kg)     Lab Results  Component Value Date   WBC 5.1 09/01/2015   HGB 13.6 09/01/2015   HCT 40.8 09/01/2015   PLT 267.0 09/01/2015   GLUCOSE 118* 09/01/2015   GLUCOSE 118* 09/01/2015   CHOL 123 09/01/2015   TRIG 136.0 09/01/2015   HDL 34.00* 09/01/2015   LDLCALC 62 09/01/2015   ALT 18 09/01/2015   AST 13 09/01/2015   NA 141 09/01/2015   NA 141 09/01/2015   K 3.8 09/01/2015   K 3.8 09/01/2015   CL 106 09/01/2015   CL 106 09/01/2015   CREATININE 0.78 09/01/2015   CREATININE 0.78 09/01/2015   BUN 14 09/01/2015   BUN 14 09/01/2015   CO2 26 09/01/2015   CO2 26 09/01/2015   INR 1.14 03/31/2012   HGBA1C 8.0* 09/01/2015    Dg Tibia/fibula Right  09/01/2015  CLINICAL DATA:  Right leg pain.  History of infection. EXAM: RIGHT TIBIA AND FIBULA - 2 VIEW COMPARISON:  None. FINDINGS: There is no evidence of fracture or other focal bone lesions. Soft tissues are unremarkable. IMPRESSION: Negative. Electronically Signed   By: Franchot Gallo M.D.   On: 09/01/2015 15:53     Assessment & Plan:  Plan I have discontinued Mr. Favor Dulaglutide. I have also changed his traMADol, gabapentin, and furosemide. Additionally, I am having him start on lisdexamfetamine and Liraglutide. Lastly, I am having him maintain his pravastatin, hydrOXYzine, metFORMIN, amLODipine, clindamycin, LORazepam, glipiZIDE, testosterone, triamcinolone cream, metoprolol succinate, testosterone cypionate, and lisdexamfetamine.  Meds ordered this encounter  Medications  . testosterone cypionate (DEPOTESTOSTERONE CYPIONATE) 200 MG/ML injection    Sig:     Refill:  4  . DISCONTD: lisdexamfetamine (VYVANSE) 30 MG capsule    Sig: Take 1 capsule (30 mg total) by mouth daily.    Dispense:  30 capsule    Refill:  0  . traMADol (ULTRAM) 50 MG tablet    Sig: Take 1 tablet (50 mg  total) by mouth every 8 (eight) hours as needed for moderate pain.    Dispense:  60 tablet    Refill:  1  . gabapentin (NEURONTIN) 300 MG capsule    Sig: Take 1 capsule (300 mg total) by mouth at bedtime.    Dispense:  30 capsule    Refill:  2  . furosemide (LASIX) 40 MG tablet    Sig: Take 1 tablet (40 mg total) by mouth 2 (two) times daily.    Dispense:  60 tablet    Refill:  3  . lisdexamfetamine (VYVANSE) 30 MG capsule    Sig: Take 1 capsule (30 mg total) by mouth daily.    Dispense:  30 capsule    Refill:  0    Do not fill until December 02, 2015  . lisdexamfetamine (VYVANSE) 30 MG capsule    Sig: Take 1 capsule (30 mg total) by mouth daily.    Dispense:  30 capsule    Refill:  0    Do not fill until Jan 01, 2016  . Liraglutide (VICTOZA) 18 MG/3ML SOPN    Sig: 1.8 mg sq qd    Dispense:  6 mL    Refill:  3    Problem List Items Addressed This Visit    ADD (attention deficit disorder) - Primary   Relevant Medications   lisdexamfetamine (VYVANSE) 30 MG capsule   lisdexamfetamine (VYVANSE) 30 MG capsule    Other Visit Diagnoses    Right leg pain        Relevant Medications    traMADol (ULTRAM) 50 MG tablet    gabapentin (NEURONTIN) 300 MG capsule    Bilateral edema of lower extremity        Relevant Medications    furosemide (LASIX) 40 MG tablet    Need for hepatitis C screening test        DM (diabetes mellitus) type II uncontrolled, periph vascular disorder (HCC)        Relevant Medications    furosemide (LASIX) 40 MG tablet    Liraglutide (VICTOZA) 18 MG/3ML SOPN    Multiple myeloma, remission status unspecified (HCC)        Relevant Medications    traMADol (ULTRAM) 50 MG tablet    gabapentin (NEURONTIN) 300 MG capsule    Other Relevant Orders    Ambulatory referral to Oncology       Follow-up: Return if symptoms worsen or fail to improve, for as scheduled, hypertension, diabetes II.  Garnet Koyanagi, DO

## 2015-11-01 NOTE — Progress Notes (Signed)
Pre visit review using our clinic review tool, if applicable. No additional management support is needed unless otherwise documented below in the visit note. 

## 2015-11-02 ENCOUNTER — Encounter: Payer: Self-pay | Admitting: Family Medicine

## 2015-11-07 ENCOUNTER — Ambulatory Visit (INDEPENDENT_AMBULATORY_CARE_PROVIDER_SITE_OTHER): Payer: BLUE CROSS/BLUE SHIELD | Admitting: Neurology

## 2015-11-07 ENCOUNTER — Other Ambulatory Visit (INDEPENDENT_AMBULATORY_CARE_PROVIDER_SITE_OTHER): Payer: BLUE CROSS/BLUE SHIELD

## 2015-11-07 ENCOUNTER — Encounter: Payer: Self-pay | Admitting: Neurology

## 2015-11-07 VITALS — BP 148/90 | HR 74 | Ht 71.0 in | Wt 334.6 lb

## 2015-11-07 DIAGNOSIS — E0842 Diabetes mellitus due to underlying condition with diabetic polyneuropathy: Secondary | ICD-10-CM

## 2015-11-07 LAB — VITAMIN B12: Vitamin B-12: 244 pg/mL (ref 211–911)

## 2015-11-07 LAB — TSH: TSH: 2.01 u[IU]/mL (ref 0.35–4.50)

## 2015-11-07 MED ORDER — NORTRIPTYLINE HCL 10 MG PO CAPS: ORAL_CAPSULE | ORAL | Status: DC

## 2015-11-07 NOTE — Patient Instructions (Addendum)
1.  NCS/EMG of the legs 2.  Check blood work 3.  Start nortriptyline 10mg  at bedtime for 2 week, then increase to 2 tablet at bedtime  Return to clinic in 4 months

## 2015-11-07 NOTE — Progress Notes (Signed)
[facility] Clinic Note - Initial Visit   Date: 11/07/2015  Tyler Brewer MRN: [id number] DOB: [birth year]   Dear Dr. Etter Sjogren:  Thank you for your kind referral of Tyler Brewer for consultation of neuropathy. Although his history is well known to you, please allow [place] to reiterate it for the purpose of our medical record. The patient was accompanied to the clinic by self.    History of Present Illness: Tyler Brewer is a 55 y.o. right-handed Caucasian male with hypertension, diabetes mellitus (HbA1c 8.0), hyperlipidemia, ADHD presenting for evaluation of neuropathy.   He was diagnosed with diabetes in 2008.  Soon afterwards, he developed intermittent numbness/tingling of the feet which progressively got worse in 2016. His pain is restricted to the feet. Symptoms are better in the morning and worse as the day progresses.  He tried gabapentin 300mg  BID, but because this made his ankle swelling worse and no relief, he stopped it a week ago.  He endorses imbalance, but has not had any falls related to this.  He endorses ankle instability and weakness, worse on the left.    He was wrongly incarcerated in Jan 2013 - Feb 2016 and reports that his achy and thorbbing leg and feet pain has worsened because of the shoes he would have to wear.  He was not in ankle shackles.  Out-side paper records, electronic medical record, and images have been reviewed where available and summarized as:  Lab Results  Component Value Date   HGBA1C 8.0* 09/01/2015   CT head wo contrast 05/04/2012:  No acute intracranial or calvarial findings. Study is mildly motion degraded.   Past Medical History  Diagnosis Date  . Hypertension   . Diabetes mellitus     poorly controlled by his report  . IBS (irritable bowel syndrome)   . History of narcotic addiction ([place])     past history of back pain  . Hypercholesteremia   . Testosterone deficiency   . Depression   . Obesity     Max  weight was 390  . CAD (coronary artery disease)  cardiac cath with moderate disease in a septal branch of the ramus intermedius 04/01/2012  . Panic attacks   . OSA on CPAP     Past Surgical History  Procedure Laterality Date  . Cardiac catheterization    . Left heart catheterization with coronary angiogram N/A 03/31/2012    Procedure: LEFT HEART CATHETERIZATION WITH CORONARY ANGIOGRAM;  Surgeon: Leonie Man, MD;  Location: [facility] CATH LAB;  Service: Cardiovascular;  Laterality: N/A;     Medications:  Outpatient Encounter Prescriptions as of 11/07/2015  Medication Sig Note  . amLODipine (NORVASC) 10 MG tablet Take 10 mg by mouth daily.   . furosemide (LASIX) 40 MG tablet Take 1 tablet (40 mg total) by mouth 2 (two) times daily.   [name] Kitchen glipiZIDE (GLUCOTROL) 10 MG tablet Take 1 tablet (10 mg total) by mouth 2 (two) times daily before a meal.   . hydrOXYzine (ATARAX/VISTARIL) 50 MG tablet Take 50 mg by mouth 3 (three) times daily as needed for anxiety.   . Liraglutide (VICTOZA) 18 MG/3ML SOPN 1.8 mg sq qd   . lisdexamfetamine (VYVANSE) 30 MG capsule Take 1 capsule (30 mg total) by mouth daily.   [name] Kitchen LORazepam (ATIVAN) 1 MG tablet Take 1 mg by mouth 3 (three) times daily as needed for anxiety.   . metFORMIN (GLUCOPHAGE) 1000 MG tablet Take 1,000 mg by mouth 2 (two) times daily with a meal.   .  metoprolol succinate (TOPROL-XL) 50 MG 24 hr tablet Take 1 tablet (50 mg total) by mouth daily. Take with or immediately following a meal.   . pravastatin (PRAVACHOL) 10 MG tablet Take 10 mg by mouth daily.   [name] Kitchen testosterone (ANDROGEL) 50 MG/5GM (1%) GEL Place 5 g onto the skin daily.   [name] Kitchen testosterone cypionate (DEPOTESTOSTERONE CYPIONATE) 200 MG/ML injection  11/01/2015: Received from: External Pharmacy Received Sig:   . traMADol (ULTRAM) 50 MG tablet Take 1 tablet (50 mg total) by mouth every 8 (eight) hours as needed for moderate pain.   [name] Kitchen triamcinolone cream (KENALOG) 0.1 % Apply 1 application topically 2  (two) times daily.   . [DISCONTINUED] clindamycin (CLEOCIN) 300 MG capsule Take 300 mg by mouth 4 (four) times daily.   . [DISCONTINUED] gabapentin (NEURONTIN) 300 MG capsule Take 1 capsule (300 mg total) by mouth at bedtime.   . [DISCONTINUED] lisdexamfetamine (VYVANSE) 30 MG capsule Take 1 capsule (30 mg total) by mouth daily.    No facility-administered encounter medications on file as of 11/07/2015.     Allergies: No Known Allergies  Family History: Family History  Problem Relation Age of Onset  . Diabetes type II    . Hypertension      Social History: Social History  Substance Use Topics  . Smoking status: Light Tobacco Smoker  . Smokeless tobacco: Never Used  . Alcohol Use: No   Social History   Social History Narrative   Lives with mother in a one story home.  Has 2 grown children.  Works as a Barrister's clerk.  Education: Oceanographer.    Review of Systems:  CONSTITUTIONAL: No fevers, chills, night sweats, or weight loss.   EYES: No visual changes or eye pain ENT: No hearing changes.  No history of nose bleeds.   RESPIRATORY: No cough, wheezing and shortness of breath.   CARDIOVASCULAR: Negative for chest pain, and palpitations.   GI: Negative for abdominal discomfort, blood in stools or black stools.  No recent change in bowel habits.   GU:  No history of incontinence.   MUSCLOSKELETAL: No history of joint pain +swelling.  No myalgias.   SKIN: Negative for lesions, rash, and itching.   HEMATOLOGY/ONCOLOGY: Negative for prolonged bleeding, bruising easily, and swollen nodes.  No history of cancer.   ENDOCRINE: Negative for cold or heat intolerance, polydipsia or goiter.   PSYCH:  No depression or anxiety symptoms.   NEURO: As Above.   Vital Signs:  BP 148/90 mmHg  Pulse 74  Ht 5\' 11"  (1.803 m)  Wt 334 lb 9 oz (151.757 kg)  BMI 46.68 kg/m2  SpO2 98%   General Medical Exam:   General:  Well appearing, comfortable.   Eyes/ENT: see cranial nerve examination.     Neck: No masses appreciated.  Full range of motion without tenderness.  No carotid bruits. Respiratory:  Clear to auscultation, good air entry bilaterally.   Cardiac:  Regular rate and rhythm, no murmur.   Extremities:  Bilateral ankle edema.    Skin:  No rashes or lesions.  Neurological Exam: MENTAL STATUS including orientation to time, place, person, recent and remote memory, attention span and concentration, language, and fund of knowledge is normal.  Speech is not dysarthric.  CRANIAL NERVES: II:  No visual field defects.  Unremarkable fundi.   III-IV-VI: Pupils equal round and reactive to light.  Normal conjugate, extra-ocular eye movements in all directions of gaze.  No nystagmus.  No ptosis.   V:  Normal  facial sensation.     VII:  Normal facial symmetry and movements VIII:  Normal hearing and vestibular function.   IX-X:  Normal palatal movement.   XI:  Normal shoulder shrug and head rotation.   XII:  Normal tongue strength and range of motion, no deviation or fasciculation.  MOTOR:  No atrophy, fasciculations or abnormal movements.  No pronator drift.  Tone is normal.    Right Upper Extremity:    Left Upper Extremity:    Deltoid  5/5   Deltoid  5/5   Biceps  5/5   Biceps  5/5   Triceps  5/5   Triceps  5/5   Wrist extensors  5/5   Wrist extensors  5/5   Wrist flexors  5/5   Wrist flexors  5/5   Finger extensors  5/5   Finger extensors  5/5   Finger flexors  5/5   Finger flexors  5/5   Dorsal interossei  5/5   Dorsal interossei  5/5   Abductor pollicis  5/5   Abductor pollicis  5/5   Tone (Ashworth scale)  0  Tone (Ashworth scale)  0   Right Lower Extremity:    Left Lower Extremity:    Hip flexors  5/5   Hip flexors  5/5   Hip extensors  5/5   Hip extensors  5/5   Knee flexors  5/5   Knee flexors  5/5   Knee extensors  5/5   Knee extensors  5/5   Dorsiflexors  5/5   Dorsiflexors  5/5   Plantarflexors  5/5   Plantarflexors  5/5   Toe extensors  5/5   Toe extensors  5/5    Toe flexors  5/5   Toe flexors  5/5   Tone (Ashworth scale)  0  Tone (Ashworth scale)  0   MSRs:  Right                                                                 Left brachioradialis 2+  brachioradialis 2+  biceps 2+  biceps 2+  triceps 2+  triceps 2+  patellar 2+  patellar 2+  ankle jerk 2+  ankle jerk 2+  Hoffman no  Hoffman no  plantar response down  plantar response down   SENSORY:  Reduced vibration at the right toe and absent left toe.  Pin prick is reduced following a gradient pattern in the left lower extremity.  Temperature and light touch intact throughout.  There is very mild sway with Rhomberg testing.  COORDINATION/GAIT: Normal finger-to- nose-finger.  Intact rapid alternating movements bilaterally.  Able to rise from a chair without using arms.  Gait narrow based and stable.  Mild unsteadiness with tandem and stressed gait.    IMPRESSION: Mr. Noggle is a 55 year-old gentleman referred for evaluation of bilateral feet and leg paresthesias.  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  His history suggests he may also have musculoskeletal pain or ankle instability in the left leg because there is "achy, throbbing" pain.   NCS/EMG will be performed to be sure a superimposed lumbar radiculopathy is not missed.  I had extensive discussion with the patient regarding the pathogenesis, etiology, management, and natural course of neuropathy. Neuropathy tends  to be slowly progressive, especially if underlying etiology, diabetes in this case, is not adequately controlled.  He has been trying to loose weight and reports recent A1c with urologist's office was 6.7.  I will test for other treatable causes of neuropathy.  Because there is ankle edema, I will avoid Lyrica and gabapentin.     PLAN/RECOMMENDATIONS:  1. Check vitamin B12, TSH, copper 2. NCS/EMG of the legs (left > right) 3. Start nortriptyline 10mg  at bedtime x 2 weeks, then  increase to 2 tablets at bedtime 4. Advised him to quit smoking  Return to clinic in 4 months.   The duration of this appointment visit was 40 minutes of face-to-face time with the patient.  Greater than 50% of this time was spent in counseling, explanation of diagnosis, planning of further management, and coordination of care.   Thank you for allowing me to participate in patient's care.  If I can answer any additional questions, I would be pleased to do so.    Sincerely,    Jen Benedict K. Posey Pronto, DO

## 2015-11-09 LAB — COPPER, SERUM: Copper: 73 ug/dL (ref 72–166)

## 2015-11-11 ENCOUNTER — Encounter: Payer: Self-pay | Admitting: *Deleted

## 2015-11-16 ENCOUNTER — Other Ambulatory Visit: Payer: Self-pay | Admitting: Family Medicine

## 2015-11-16 ENCOUNTER — Encounter: Payer: Self-pay | Admitting: Family Medicine

## 2015-11-16 DIAGNOSIS — F988 Other specified behavioral and emotional disorders with onset usually occurring in childhood and adolescence: Secondary | ICD-10-CM

## 2015-11-17 ENCOUNTER — Encounter: Payer: Self-pay | Admitting: Family Medicine

## 2015-11-17 MED ORDER — LISDEXAMFETAMINE DIMESYLATE 30 MG PO CAPS: 30.0000 mg | ORAL_CAPSULE | Freq: Every day | ORAL | Status: DC

## 2015-11-17 NOTE — Telephone Encounter (Signed)
Double check with pharmacy but he is asking for refill too soon

## 2015-11-17 NOTE — Telephone Encounter (Signed)
Message left for the patient to call and schedule an OV tomorrow.    KP

## 2015-11-17 NOTE — Telephone Encounter (Signed)
He said he was seeing a girl and he has had to call the police on her because she stole his car and he found out she was taking Methamphetamine. He thinks she stole them. He also stated his feet are worst, he said he can not feel his feet and they hurt, he said he is losing his balance,  Do you want him to come in again?    KP

## 2015-11-17 NOTE — Telephone Encounter (Signed)
Yes and we need to see the police report to get him any more and that is not a guarantee that his ins will even fill it

## 2015-11-17 NOTE — Telephone Encounter (Signed)
Rx was filled 11/01/15 and he has a script but can not fill until 12/02/15. Please advise    KP

## 2015-11-18 ENCOUNTER — Encounter: Payer: Self-pay | Admitting: Family Medicine

## 2015-11-18 NOTE — Telephone Encounter (Signed)
Pt called in to speak with CMA. He says that he is returning a call.   CB: [phone]

## 2015-11-18 NOTE — Telephone Encounter (Signed)
Would you schedule him to see Dr.Lowne on Monday for swelling.   KP

## 2015-11-21 ENCOUNTER — Encounter: Payer: Self-pay | Admitting: Family Medicine

## 2015-11-21 ENCOUNTER — Ambulatory Visit (HOSPITAL_BASED_OUTPATIENT_CLINIC_OR_DEPARTMENT_OTHER)
Admission: RE | Admit: 2015-11-21 | Discharge: 2015-11-21 | Disposition: A | Discharge status: Home / Self Care | Payer: BLUE CROSS/BLUE SHIELD | Source: Ambulatory Visit | Attending: Family Medicine | Admitting: Family Medicine

## 2015-11-21 ENCOUNTER — Ambulatory Visit (INDEPENDENT_AMBULATORY_CARE_PROVIDER_SITE_OTHER): Payer: BLUE CROSS/BLUE SHIELD | Admitting: Family Medicine

## 2015-11-21 VITALS — BP 134/88 | HR 90 | Temp 98.8°F | Ht 71.0 in | Wt 326.8 lb

## 2015-11-21 DIAGNOSIS — M79662 Pain in left lower leg: Secondary | ICD-10-CM | POA: Insufficient documentation

## 2015-11-21 DIAGNOSIS — I10 Essential (primary) hypertension: Secondary | ICD-10-CM | POA: Diagnosis not present

## 2015-11-21 DIAGNOSIS — M79661 Pain in right lower leg: Secondary | ICD-10-CM

## 2015-11-21 DIAGNOSIS — R600 Localized edema: Secondary | ICD-10-CM

## 2015-11-21 DIAGNOSIS — R609 Edema, unspecified: Secondary | ICD-10-CM | POA: Insufficient documentation

## 2015-11-21 DIAGNOSIS — R0600 Dyspnea, unspecified: Secondary | ICD-10-CM

## 2015-11-21 DIAGNOSIS — R0609 Other forms of dyspnea: Secondary | ICD-10-CM

## 2015-11-21 LAB — BASIC METABOLIC PANEL
BUN: 14 mg/dL (ref 6–23)
CO2: 29 mEq/L (ref 19–32)
Calcium: 10 mg/dL (ref 8.4–10.5)
Chloride: 100 mEq/L (ref 96–112)
Creatinine, Ser: 1.03 mg/dL (ref 0.40–1.50)
GFR: 80.44 mL/min (ref >60.00)
Glucose, Bld: 181 mg/dL — ABNORMAL HIGH (ref 70–99)
Potassium: 3.3 mEq/L — ABNORMAL LOW (ref 3.5–5.1)
Sodium: 139 mEq/L (ref 135–145)

## 2015-11-21 MED ORDER — METOPROLOL SUCCINATE ER 100 MG PO TB24: 100.0000 mg | ORAL_TABLET | Freq: Every day | ORAL | Status: DC

## 2015-11-21 NOTE — Patient Instructions (Signed)
Edema °Edema is an abnormal buildup of fluids in your body tissues. Edema is somewhat dependent on gravity to pull the fluid to the lowest place in your body. That makes the condition more common in the legs and thighs (lower extremities). Painless swelling of the feet and ankles is common and becomes more likely as you get older. It is also common in looser tissues, like around your eyes.  °When the affected area is squeezed, the fluid may move out of that spot and leave a dent for a few moments. This dent is called pitting.  °CAUSES  °There are many possible causes of edema. Eating too much salt and being on your feet or sitting for a long time can cause edema in your legs and ankles. Hot weather may make edema worse. Common medical causes of edema include: °· Heart failure. °· Liver disease. °· Kidney disease. °· Weak blood vessels in your legs. °· Cancer. °· An injury. °· Pregnancy. °· Some medications. °· Obesity.  °SYMPTOMS  °Edema is usually painless. Your skin may look swollen or shiny.  °DIAGNOSIS  °Your health care provider may be able to diagnose edema by asking about your medical history and doing a physical exam. You may need to have tests such as X-rays, an electrocardiogram, or blood tests to check for medical conditions that may cause edema.  °TREATMENT  °Edema treatment depends on the cause. If you have heart, liver, or kidney disease, you need the treatment appropriate for these conditions. General treatment may include: °· Elevation of the affected body part above the level of your heart. °· Compression of the affected body part. Pressure from elastic bandages or support stockings squeezes the tissues and forces fluid back into the blood vessels. This keeps fluid from entering the tissues. °· Restriction of fluid and salt intake. °· Use of a water pill (diuretic). These medications are appropriate only for some types of edema. They pull fluid out of your body and make you urinate more often. This  gets rid of fluid and reduces swelling, but diuretics can have side effects. Only use diuretics as directed by your health care provider. °HOME CARE INSTRUCTIONS  °· Keep the affected body part above the level of your heart when you are lying down.   °· Do not sit still or stand for prolonged periods.   °· Do not put anything directly under your knees when lying down. °· Do not wear constricting clothing or garters on your upper legs.   °· Exercise your legs to work the fluid back into your blood vessels. This may help the swelling go down.   °· Wear elastic bandages or support stockings to reduce ankle swelling as directed by your health care provider.   °· Eat a low-salt diet to reduce fluid if your health care provider recommends it.   °· Only take medicines as directed by your health care provider.  °SEEK MEDICAL CARE IF:  °· Your edema is not responding to treatment. °· You have heart, liver, or kidney disease and notice symptoms of edema. °· You have edema in your legs that does not improve after elevating them.   °· You have sudden and unexplained weight gain. °SEEK IMMEDIATE MEDICAL CARE IF:  °· You develop shortness of breath or chest pain.   °· You cannot breathe when you lie down. °· You develop pain, redness, or warmth in the swollen areas.   °· You have heart, liver, or kidney disease and suddenly get edema. °· You have a fever and your symptoms suddenly get worse. °MAKE SURE YOU:  °·   Understand these instructions. °· Will watch your condition. °· Will get help right away if you are not doing well or get worse. °  °This information is not intended to replace advice given to you by your health care provider. Make sure you discuss any questions you have with your health care provider. °  °Document Released: 08/06/2005 Document Revised: 08/27/2014 Document Reviewed: 05/29/2013 °Elsevier Interactive Patient Education ©2016 [employer] ° °

## 2015-11-21 NOTE — Progress Notes (Signed)
Pre visit review using our clinic review tool, if applicable. No additional management support is needed unless otherwise documented below in the visit note. 

## 2015-11-21 NOTE — Telephone Encounter (Signed)
Rx to pharmacy 11/01/15.

## 2015-11-21 NOTE — Progress Notes (Signed)
Patient ID: Tyler Brewer, male    DOB: 01/14/1961  Age: 55 y.o. MRN: [id number]    Subjective:  Subjective HPI [name] presents for c/o leg pain and swelling that is worse even after stopping the norvasc and inc the lasix.  No sob or cp at rest.  +sob with exertion.    Review of Systems  Constitutional: Negative for diaphoresis, appetite change, fatigue and unexpected weight change.  Eyes: Negative for pain, redness and visual disturbance.  Respiratory: Positive for shortness of breath. Negative for cough, chest tightness and wheezing.   Cardiovascular: Positive for leg swelling. Negative for chest pain and palpitations.  Endocrine: Negative for cold intolerance, heat intolerance, polydipsia, polyphagia and polyuria.  Genitourinary: Negative for dysuria, frequency and difficulty urinating.  Neurological: Negative for dizziness, light-headedness, numbness and headaches.    History Past Medical History  Diagnosis Date  . Hypertension   . Diabetes mellitus     poorly controlled by his report  . IBS (irritable bowel syndrome)   . History of narcotic addiction ([place])     past history of back pain  . Hypercholesteremia   . Testosterone deficiency   . Depression   . Obesity     Max weight was 390  . CAD (coronary artery disease)  cardiac cath with moderate disease in a septal branch of the ramus intermedius 04/01/2012  . Panic attacks   . OSA on CPAP     He has past surgical history that includes Cardiac catheterization and left heart catheterization with coronary angiogram (N/A, 03/31/2012).   His family history includes Diabetes type II in his father; Healthy in his daughter, mother, sister, and son; Hypertension in his father; Pancreatic disease (age of onset: 65) in his father.He reports that he quit smoking today. He has never used smokeless tobacco. He reports that he does not drink alcohol or use illicit drugs.  Current Outpatient Prescriptions on File Prior to Visit    Medication Sig Dispense Refill  . furosemide (LASIX) 40 MG tablet Take 1 tablet (40 mg total) by mouth 2 (two) times daily. 60 tablet 3  . glipiZIDE (GLUCOTROL) 10 MG tablet Take 1 tablet (10 mg total) by mouth 2 (two) times daily before a meal. 60 tablet 2  . hydrOXYzine (ATARAX/VISTARIL) 50 MG tablet Take 50 mg by mouth 3 (three) times daily as needed for anxiety.    . Liraglutide (VICTOZA) 18 MG/3ML SOPN 1.8 mg sq qd 6 mL 3  . lisdexamfetamine (VYVANSE) 30 MG capsule Take 1 capsule (30 mg total) by mouth daily. 30 capsule 0  . LORazepam (ATIVAN) 1 MG tablet Take 1 mg by mouth 3 (three) times daily as needed for anxiety.    . metFORMIN (GLUCOPHAGE) 1000 MG tablet Take 1,000 mg by mouth 2 (two) times daily with a meal.    . nortriptyline (PAMELOR) 10 MG capsule Take 10mg  at bedtime for 2 week, then increase to 2 tablet at bedtime 60 capsule 5  . pravastatin (PRAVACHOL) 10 MG tablet Take 10 mg by mouth daily.    [name] Kitchen testosterone (ANDROGEL) 50 MG/5GM (1%) GEL Place 5 g onto the skin daily.    [name] Kitchen testosterone cypionate (DEPOTESTOSTERONE CYPIONATE) 200 MG/ML injection   4  . traMADol (ULTRAM) 50 MG tablet Take 1 tablet (50 mg total) by mouth every 8 (eight) hours as needed for moderate pain. 60 tablet 1  . triamcinolone cream (KENALOG) 0.1 % Apply 1 application topically 2 (two) times daily. 30 g 0   No  current facility-administered medications on file prior to visit.     Objective:  Objective Physical Exam  Constitutional: He is oriented to person, place, and time. Vital signs are normal. He appears well-developed and well-nourished. He is sleeping.  HENT:  Head: Normocephalic and atraumatic.  Mouth/Throat: Oropharynx is clear and moist.  Eyes: EOM are normal. Pupils are equal, round, and reactive to light.  Neck: Normal range of motion. Neck supple. No thyromegaly present.  Cardiovascular: Normal rate and regular rhythm.   No murmur heard. Pulmonary/Chest: Effort normal and breath sounds  normal. No respiratory distress. He has no wheezes. He has no rales. He exhibits no tenderness.  Musculoskeletal: He exhibits edema and tenderness.  Neurological: He is alert and oriented to person, place, and time.  Skin: Skin is warm and dry.  Psychiatric: He has a normal mood and affect. His behavior is normal. Judgment and thought content normal.  Nursing note and vitals reviewed.  BP 134/88 mmHg  Pulse 90  Temp(Src) 98.8 F (37.1 C) (Oral)  Ht 5\' 11"  (1.803 m)  Wt 326 lb 12.8 oz (148.236 kg)  BMI 45.60 kg/m2  SpO2 98% Wt Readings from Last 3 Encounters:  11/21/15 326 lb 12.8 oz (148.236 kg)  11/07/15 334 lb 9 oz (151.757 kg)  11/01/15 329 lb 12.8 oz (149.596 kg)     Lab Results  Component Value Date   WBC 5.1 09/01/2015   HGB 13.6 09/01/2015   HCT 40.8 09/01/2015   PLT 267.0 09/01/2015   GLUCOSE 181* 11/21/2015   CHOL 123 09/01/2015   TRIG 136.0 09/01/2015   HDL 34.00* 09/01/2015   LDLCALC 62 09/01/2015   ALT 18 09/01/2015   AST 13 09/01/2015   NA 139 11/21/2015   K 3.3* 11/21/2015   CL 100 11/21/2015   CREATININE 1.03 11/21/2015   BUN 14 11/21/2015   CO2 29 11/21/2015   TSH 2.01 11/07/2015   INR 1.14 03/31/2012   HGBA1C 8.0* 09/01/2015    Dg Tibia/fibula Right  09/01/2015  CLINICAL DATA:  Right leg pain.  History of infection. EXAM: RIGHT TIBIA AND FIBULA - 2 VIEW COMPARISON:  None. FINDINGS: There is no evidence of fracture or other focal bone lesions. Soft tissues are unremarkable. IMPRESSION: Negative. Electronically Signed   By: Franchot Gallo M.D.   On: 09/01/2015 15:53     Assessment & Plan:  Plan I have discontinued Mr. Bernd amLODipine and metoprolol succinate. I am also having him start on metoprolol succinate. Additionally, I am having him maintain his pravastatin, hydrOXYzine, metFORMIN, LORazepam, glipiZIDE, testosterone, triamcinolone cream, testosterone cypionate, traMADol, furosemide, Liraglutide, nortriptyline, and  lisdexamfetamine.  Meds ordered this encounter  Medications  . metoprolol succinate (TOPROL-XL) 100 MG 24 hr tablet    Sig: Take 1 tablet (100 mg total) by mouth daily. Take with or immediately following a meal.    Dispense:  90 tablet    Refill:  3    Problem List Items Addressed This Visit    None    Visit Diagnoses    Bilateral calf pain    -  Primary    Relevant Orders    US Venous Img Lower Bilateral (Completed)    Edema extremities        Relevant Orders    Basic metabolic panel (Completed)    US Venous Img Lower Bilateral (Completed)    DOE (dyspnea on exertion)        Relevant Orders    ECHOCARDIOGRAM COMPLETE    DG Chest  2 View    Essential hypertension        Relevant Medications    metoprolol succinate (TOPROL-XL) 100 MG 24 hr tablet    Other Relevant Orders    Basic metabolic panel (Completed)       Follow-up: Return in about 2 weeks (around 12/05/2015), or if symptoms worsen or fail to improve.  [name], DO

## 2015-11-22 ENCOUNTER — Encounter: Payer: Self-pay | Admitting: Family Medicine

## 2015-11-22 ENCOUNTER — Ambulatory Visit (INDEPENDENT_AMBULATORY_CARE_PROVIDER_SITE_OTHER): Payer: BLUE CROSS/BLUE SHIELD | Admitting: Neurology

## 2015-11-22 DIAGNOSIS — E0842 Diabetes mellitus due to underlying condition with diabetic polyneuropathy: Secondary | ICD-10-CM | POA: Diagnosis not present

## 2015-11-22 NOTE — Procedures (Signed)
[facility]  [address], [place]  [place], [place] [address] Tel: [phone] Fax:  [phone] Test Date:  11/22/2015  Patient: Tyler Brewer DOB: 27-Jul-1961 Physician: Narda Amber, DO  Sex: Male Height: 5\' 11"  Ref Phys: Narda Amber, DO  ID#: [id number] Temp: 33.3C Technician: Jerilynn Mages. Dean   Patient Complaints: This is a 55 year old gentleman referred for evaluation of bilateral feet numbness, pain and tingling.  NCV & EMG Findings: Extensive electrodiagnostic testing of the left lower extremity and additional studies of the right shows: 1. Bilateral sural and superficial peroneal sensory responses are absent. 2. Bilateral peroneal and tibial motor responses are within normal limits. 3. Bilateral tibial H reflex studies are within normal limits. 4. Chronic motor axon loss changes are seen affecting the muscles below the knee, without accompanied active denervation.  Impression: The electrophysiologic findings are most consistent with a distal and symmetric sensorimotor polyneuropathy, axon loss in type, affecting the lower extremities.   ___________________________ Narda Amber, DO    Nerve Conduction Studies Anti Sensory Summary Table   Stim Site NR Peak (ms) Norm Peak (ms) P-T Amp (V) Norm P-T Amp  Left Sup Peroneal Anti Sensory (Ant Lat Mall)  33.3C  12 cm NR  <4.6  >4  Right Sup Peroneal Anti Sensory (Ant Lat Mall)  33.3C  12 cm NR  <4.6  >4  Left Sural Anti Sensory (Lat Mall)  33.3C  Calf NR  <4.6  >4  Right Sural Anti Sensory (Lat Mall)  33.3C  Calf NR  <4.6  >4   Motor Summary Table   Stim Site NR Onset (ms) Norm Onset (ms) O-P Amp (mV) Norm O-P Amp Site1 Site2 Delta-0 (ms) Dist (cm) Vel (m/s) Norm Vel (m/s)  Left Peroneal Motor (Ext Dig Brev)  33.3C  Ankle    4.6 <6.0 3.0 >2.5 B Fib Ankle 7.4 33.0 45 >40  B Fib    12.0  2.8  Poplt B Fib 2.2 10.0 45 >40  Poplt    14.2  2.8         Right Peroneal Motor (Ext Dig Brev)  33.3C  Ankle     3.6 <6.0 4.0 >2.5 B Fib Ankle 8.2 35.0 43 >40  B Fib    11.8  3.5  Poplt B Fib 2.4 10.0 42 >40  Poplt    14.2  3.5         Left Tibial Motor (Abd Hall Brev)  33.3C  Ankle    4.7 <6.0 5.2 >4 Knee Ankle 10.6 43.0 41 >40  Knee    15.3  3.6         Right Tibial Motor (Abd Hall Brev)  33.3C  Ankle    3.8 <6.0 4.5 >4 Knee Ankle 10.3 42.0 41 >40  Knee    14.1  4.6          H Reflex Studies   NR H-Lat (ms) Lat Norm (ms) L-R H-Lat (ms)  Left Tibial (Gastroc)  33.3C     33.88 <35 0.00  Right Tibial (Gastroc)  33.3C     33.88 <35 0.00   EMG   Side Muscle Ins Act Fibs Psw Fasc Number Recrt Dur Dur. Amp Amp. Poly Poly. Comment  Left AntTibialis Nml Nml Nml Nml 1- Mod-R Few 1+ Few 1+ Nml Nml N/A  Left Gastroc Nml Nml Nml Nml 1- Mod-R Few 1+ Few 1+ Nml Nml N/A  Left BicepsFemS Nml Nml Nml Nml Nml Nml Nml Nml Nml Nml Nml  Nml N/A  Left Flex Dig Long Nml Nml Nml Nml 1- Mod-R Few 1+ Few 1+ Nml Nml N/A  Left RectFemoris Nml Nml Nml Nml Nml Nml Nml Nml Nml Nml Nml Nml N/A  Left GluteusMed Nml Nml Nml Nml Nml Nml Nml Nml Nml Nml Nml Nml N/A  Right AntTibialis Nml Nml Nml Nml 1- Mod-R Few 1+ Few 1+ Nml Nml N/A  Right Gastroc Nml Nml Nml Nml 1- Mod-R Few 1+ Few 1+ Nml Nml N/A      Waveforms:

## 2015-11-23 ENCOUNTER — Encounter: Payer: Self-pay | Admitting: Family Medicine

## 2015-11-24 NOTE — Telephone Encounter (Signed)
Patient scheduled for 12/12/2015 at 3pm.

## 2015-11-29 ENCOUNTER — Encounter: Payer: Self-pay | Admitting: Family Medicine

## 2015-11-29 NOTE — Telephone Encounter (Signed)
Tyler Brewer --- would triage this? He may need to be seen.

## 2015-11-30 ENCOUNTER — Encounter: Payer: Self-pay | Admitting: Pulmonary Disease

## 2015-11-30 ENCOUNTER — Encounter: Payer: Self-pay | Admitting: Family Medicine

## 2015-11-30 ENCOUNTER — Ambulatory Visit (INDEPENDENT_AMBULATORY_CARE_PROVIDER_SITE_OTHER): Payer: BLUE CROSS/BLUE SHIELD | Admitting: Pulmonary Disease

## 2015-11-30 VITALS — BP 132/88 | HR 78 | Ht 71.0 in | Wt 333.0 lb

## 2015-11-30 DIAGNOSIS — G4710 Hypersomnia, unspecified: Secondary | ICD-10-CM | POA: Diagnosis not present

## 2015-11-30 DIAGNOSIS — G4733 Obstructive sleep apnea (adult) (pediatric): Secondary | ICD-10-CM

## 2015-11-30 DIAGNOSIS — M79604 Pain in right leg: Secondary | ICD-10-CM

## 2015-11-30 DIAGNOSIS — R0609 Other forms of dyspnea: Secondary | ICD-10-CM | POA: Diagnosis not present

## 2015-11-30 DIAGNOSIS — R0600 Dyspnea, unspecified: Secondary | ICD-10-CM

## 2015-11-30 NOTE — Assessment & Plan Note (Signed)
Advised weight reduction.  Pt considering baraitric surgery

## 2015-11-30 NOTE — Assessment & Plan Note (Signed)
Patient with sleep apnea, diagnosed in 2004. Initial study done in [place]. MD who ordered PSG was Dr. Maximino Greenland.  Most likely severe. He had CPAP 14 cm water. He ended up using his father's CPAP machine in 2008 until he broke a year ago. Currently, he is symptomatic. Has hypersomnia, snoring, fatigue. Hypersomnia affects functionality. No abnormal behavior and sleep. Plan : 1. Per insurance, he will need a split-night study. The earliest we can do it here is in June. Plan to do it at an outside lab per patient's request. 2. Anticipate no issues with CPAP. Plan to start an auto CPAP 5-15 cm water. He was on 14 cm water. 3. Clean supplies frequently.

## 2015-11-30 NOTE — Progress Notes (Signed)
Subjective:    Patient ID: Tyler Brewer, male    DOB: 03/29/1961, 55 y.o.   MRN: [id number]  HPI   This is the case of Tyler Brewer, 55 y.o. Male, who was referred by Dr. Lyndal Pulley  in consultation regarding his OSA  As you very well know, patient was dxed with OSA in 2204 in [place], [place]. Unsure severity. Was on CPAP 14. Felt better using it.   He felt better with CPAP. More energy. Less sleepiness.  Has not had a cpap x 1 yr > symptomatic now. Has hypersomnia, snoring, fatigue. Hypersomnia affects fxnality.   He has lost 60 lbs since 04.   ESS 16.   Review of Systems  Constitutional: Negative.  Negative for fever and unexpected weight change.  HENT: Positive for congestion, postnasal drip and sinus pressure. Negative for dental problem, ear pain, nosebleeds, rhinorrhea, sneezing, sore throat and trouble swallowing.   Eyes: Positive for redness and itching.  Respiratory: Positive for shortness of breath and wheezing. Negative for cough and chest tightness.   Cardiovascular: Positive for leg swelling. Negative for palpitations.  Gastrointestinal: Negative.  Negative for nausea and vomiting.  Endocrine: Negative.   Genitourinary: Negative.  Negative for dysuria.  Musculoskeletal: Negative for joint swelling.  Skin: Positive for rash.  Allergic/Immunologic: Positive for environmental allergies.  Neurological: Negative.  Negative for headaches.  Hematological: Negative.  Does not bruise/bleed easily.  Psychiatric/Behavioral: Negative.  Negative for dysphoric mood. The patient is not nervous/anxious.    Past Medical History  Diagnosis Date  . Hypertension   . Diabetes mellitus     poorly controlled by his report  . IBS (irritable bowel syndrome)   . History of narcotic addiction ([place])     past history of back pain  . Hypercholesteremia   . Testosterone deficiency   . Depression   . Obesity     Max weight was 390  . CAD (coronary artery disease)  cardiac cath  with moderate disease in a septal branch of the ramus intermedius 04/01/2012  . Panic attacks   . OSA on CPAP    (2 no any history of blood clot-)  DVT, CA  Family History  Problem Relation Age of Onset  . Diabetes type II Father   . Hypertension Father   . Pancreatic disease Father 49    Deceased  . Healthy Mother   . Healthy Sister   . Healthy Son   . Healthy Daughter      Past Surgical History  Procedure Laterality Date  . Cardiac catheterization    . Left heart catheterization with coronary angiogram N/A 03/31/2012    Procedure: LEFT HEART CATHETERIZATION WITH CORONARY ANGIOGRAM;  Surgeon: Leonie Man, MD;  Location: [facility] CATH LAB;  Service: Cardiovascular;  Laterality: N/A;  Quit smoking recently. (-) ETOH.   Social History   Social History  . Marital Status: Divorced    Spouse Name: N/A  . Number of Children: N/A  . Years of Education: N/A   Occupational History  . Not on file.   Social History Main Topics  . Smoking status: Former Smoker    Quit date: 11/21/2015  . Smokeless tobacco: Never Used  . Alcohol Use: No  . Drug Use: No  . Sexual Activity: Not Currently   Other Topics Concern  . Not on file   Social History Narrative   Lives with mother in a one story home.  Has 2 grown children.     Works as  a Barrister's clerk.  Education: Oceanographer.     No Known Allergies   Outpatient Prescriptions Prior to Visit  Medication Sig Dispense Refill  . furosemide (LASIX) 40 MG tablet Take 1 tablet (40 mg total) by mouth 2 (two) times daily. 60 tablet 3  . glipiZIDE (GLUCOTROL) 10 MG tablet Take 1 tablet (10 mg total) by mouth 2 (two) times daily before a meal. 60 tablet 2  . Liraglutide (VICTOZA) 18 MG/3ML SOPN 1.8 mg sq qd 6 mL 3  . lisdexamfetamine (VYVANSE) 30 MG capsule Take 1 capsule (30 mg total) by mouth daily. 30 capsule 0  . LORazepam (ATIVAN) 1 MG tablet Take 1 mg by mouth 3 (three) times daily as needed for anxiety.    . metFORMIN (GLUCOPHAGE) 1000 MG  tablet Take 1,000 mg by mouth 2 (two) times daily with a meal.    . metoprolol succinate (TOPROL-XL) 100 MG 24 hr tablet Take 1 tablet (100 mg total) by mouth daily. Take with or immediately following a meal. 90 tablet 3  . nortriptyline (PAMELOR) 10 MG capsule Take 10mg  at bedtime for 2 week, then increase to 2 tablet at bedtime 60 capsule 5  . pravastatin (PRAVACHOL) 10 MG tablet Take 10 mg by mouth daily.    [name] Kitchen testosterone (ANDROGEL) 50 MG/5GM (1%) GEL Place 5 g onto the skin daily.    [name] Kitchen testosterone cypionate (DEPOTESTOSTERONE CYPIONATE) 200 MG/ML injection   4  . traMADol (ULTRAM) 50 MG tablet Take 1 tablet (50 mg total) by mouth every 8 (eight) hours as needed for moderate pain. 60 tablet 1  . triamcinolone cream (KENALOG) 0.1 % Apply 1 application topically 2 (two) times daily. 30 g 0  . hydrOXYzine (ATARAX/VISTARIL) 50 MG tablet Take 50 mg by mouth 3 (three) times daily as needed for anxiety.     No facility-administered medications prior to visit.   Meds ordered this encounter  Medications  . amLODipine (NORVASC) 10 MG tablet    Sig: Take 1 tablet by mouth daily.    Refill:  3           Objective:   Physical Exam  Vitals:  Filed Vitals:   11/30/15 0905  BP: 132/88  Pulse: 78  Height: 5\' 11"  (1.803 m)  Weight: 333 lb (151.048 kg)  SpO2: 97%    Constitutional/General:  Pleasant, well-nourished, well-developed, not in any distress,  Comfortably seating.  Well kempt  Body mass index is 46.46 kg/(m^2). Wt Readings from Last 3 Encounters:  11/30/15 333 lb (151.048 kg)  11/21/15 326 lb 12.8 oz (148.236 kg)  11/07/15 334 lb 9 oz (151.757 kg)    Neck circumference: 19.5 in  HEENT: Pupils equal and reactive to light and accommodation. Anicteric sclerae. Normal nasal mucosa.   No oral  lesions,  mouth clear,  oropharynx clear, no postnasal drip. (-) Oral thrush. No dental caries.  Airway - Mallampati class III  Neck: No masses. Midline trachea. No JVD, (-) LAD. (-)  bruits appreciated.  Respiratory/Chest: Grossly normal chest. (-) deformity. (-) Accessory muscle use.  Symmetric expansion. (-) Tenderness on palpation.  Resonant on percussion.  Diminished BS on both lower lung zones. (-) wheezing, crackles, rhonchi (-) egophony  Cardiovascular: Regular rate and  rhythm, heart sounds normal, no murmur or gallops, no peripheral edema  Gastrointestinal:  Normal bowel sounds. Soft, non-tender. No hepatosplenomegaly.  (-) masses.   Musculoskeletal:  Normal muscle tone. Normal gait.   Extremities: Grossly normal. (-) clubbing, cyanosis.  (-) edema  Skin: (-) rash,lesions seen.   Neurological/Psychiatric : alert, oriented to time, place, person. Normal mood and affect            Assessment & Plan:  OSA (obstructive sleep apnea) Patient with sleep apnea, diagnosed in 2004. Initial study done in [place]. MD who ordered PSG was Dr. Maximino Greenland.  Most likely severe. He had CPAP 14 cm water. He ended up using his father's CPAP machine in 2008 until he broke a year ago. Currently, he is symptomatic. Has hypersomnia, snoring, fatigue. Hypersomnia affects functionality. No abnormal behavior and sleep. Plan : 1. Per insurance, he will need a split-night study. The earliest we can do it here is in June. Plan to do it at an outside lab per patient's request. 2. Anticipate no issues with CPAP. Plan to start an auto CPAP 5-15 cm water. He was on 14 cm water. 3. Clean supplies frequently.                                                                                  Morbid obesity ([place]) Advised weight reduction.  Pt considering baraitric surgery  Exertional dyspnea SOB likely 2/2 morbid obesity. Smoked some and quit recently. Will observe.     Thank you very much for letting me participate in this patient's care. Please do not hesitate to give me a call if you have any questions or concerns regarding the treatment plan.   Patient will  follow up with me in 3-4 mos.     Monica Becton, MD 11/30/2015   9:32 AM Pulmonary and [place] Pager: [phone] Office: [phone], Fax: [phone]

## 2015-11-30 NOTE — Patient Instructions (Signed)
1. We will schedule you for a split night study. 2. Give [place] a call if you don't hear anything from [place] re: sleep study. 3. Use cpap when you get it.  Return to clinic in 3-4 mos

## 2015-11-30 NOTE — Telephone Encounter (Signed)
Called to follow up with patient. States his symptoms are due to anxiety.  States he's mind just races and he can't get it to stop.  It has been going on for "months."  He's not sleeping well, but went to see pulmonologist today and has a sleep study scheduled.  Pt has an appt scheduled with Dr. Etter Sjogren on 12/12/14 and would like to discuss symptoms then.   Pt was advised if symptoms worsen, to call back for an early appt.  If during after hours, to go to UC or ER.  He stated understanding and agreed to comply.    Anxiety added to appt note.  ASL

## 2015-11-30 NOTE — Assessment & Plan Note (Signed)
SOB likely 2/2 morbid obesity. Smoked some and quit recently. Will observe.

## 2015-12-01 MED ORDER — TRAMADOL HCL 50 MG PO TABS: 50.0000 mg | ORAL_TABLET | Freq: Three times a day (TID) | ORAL | Status: DC | PRN

## 2015-12-01 MED ORDER — TRAMADOL HCL 50 MG PO TABS: 100.0000 mg | ORAL_TABLET | Freq: Three times a day (TID) | ORAL | Status: DC | PRN

## 2015-12-01 NOTE — Telephone Encounter (Signed)
Ok to call in 47 ultram

## 2015-12-02 ENCOUNTER — Ambulatory Visit (HOSPITAL_BASED_OUTPATIENT_CLINIC_OR_DEPARTMENT_OTHER): Payer: BLUE CROSS/BLUE SHIELD | Attending: Pulmonary Disease

## 2015-12-02 VITALS — Ht 71.0 in | Wt 325.0 lb

## 2015-12-02 DIAGNOSIS — R0683 Snoring: Secondary | ICD-10-CM | POA: Diagnosis not present

## 2015-12-02 DIAGNOSIS — G4736 Sleep related hypoventilation in conditions classified elsewhere: Secondary | ICD-10-CM | POA: Diagnosis not present

## 2015-12-02 DIAGNOSIS — G4733 Obstructive sleep apnea (adult) (pediatric): Secondary | ICD-10-CM | POA: Diagnosis present

## 2015-12-02 DIAGNOSIS — G4710 Hypersomnia, unspecified: Secondary | ICD-10-CM | POA: Diagnosis not present

## 2015-12-07 ENCOUNTER — Encounter: Payer: Self-pay | Admitting: Family Medicine

## 2015-12-07 DIAGNOSIS — G4710 Hypersomnia, unspecified: Secondary | ICD-10-CM | POA: Diagnosis not present

## 2015-12-08 ENCOUNTER — Telehealth: Payer: Self-pay | Admitting: Pulmonary Disease

## 2015-12-08 DIAGNOSIS — G4733 Obstructive sleep apnea (adult) (pediatric): Secondary | ICD-10-CM

## 2015-12-08 NOTE — Telephone Encounter (Signed)
    Please call the pt and tell the pt the  SLEEP STUDY  showed severe OSA.   Pt stops breathing  30   times an hour.   Please order autoCPAP 5-15 cm H2O. Patient will need a mask fitting session. Patient will need a 1 month download.   Patient needs to be seen 4-6 weeks after obtaining the cpap machine. Let me know if you receive this.   Thanks!   J. Shirl Harris, MD 12/08/2015, 2:30 PM

## 2015-12-08 NOTE — Progress Notes (Signed)
Patient Name: Tyler Brewer, Tyler Brewer Date: 12/02/2015   Gender: Male  D.O.B: [birth year]  Age (years): 54  Referring Provider: [place]   Height (inches): 71  Interpreting Physician: Whitley   Weight (lbs): 325  RPSGT: Jonna Coup   BMI: 45  MRN: [id number]  Neck Size: 19.00    CLINICAL INFORMATION  Sleep Study Type: NPSG  Indication for sleep study: OSA  Epworth Sleepiness Score: 13   SLEEP STUDY TECHNIQUE  As per the AASM Manual for the Scoring of Sleep and Associated Events v2.3 (April 2016) with a hypopnea requiring 4% desaturations.  The channels recorded and monitored were frontal, central and occipital EEG, electrooculogram (EOG), submentalis EMG (chin), nasal and oral airflow, thoracic and abdominal wall motion, anterior tibialis EMG, snore microphone, electrocardiogram, and pulse oximetry.   MEDICATIONS  Patient's medications include: Per chart review done. Medications self-administered by patient during sleep study : No sleep medicine administered.  SLEEP ARCHITECTURE  The study was initiated at 9:43:45 PM and ended at 4:21:38 AM.  Sleep onset time was 124.6 minutes and the sleep efficiency was 50.1%. The total sleep time was 199.5 minutes.  Stage REM latency was 45.0 minutes.  The patient spent 2.51% of the night in stage N1 sleep, 83.46% in stage N2 sleep, 0.00% in stage N3 and 14.04% in REM.  Alpha intrusion was absent.  Supine sleep was 37.34%.   RESPIRATORY PARAMETERS  The overall apnea/hypopnea index (AHI) was 30.7 per hour. There were 29 total apneas, including 28 obstructive, 1 central and 0 mixed apneas. There were 73 hypopneas and 11 RERAs.  The AHI during Stage REM sleep was 45.0 per hour. AHI while supine was 52.3 per hour.  The mean oxygen saturation was 94.03%. The minimum SpO2 during sleep was 85.00%.  Loud snoring was noted during this study.  CARDIAC DATA  The 2 lead EKG demonstrated sinus rhythm. The mean heart rate  was 66.48 beats per minute. Other EKG findings include: None.   LEG MOVEMENT DATA  The total PLMS were 194 with a resulting PLMS index of 58.35. Associated arousal with leg movement index was 6.6 .  IMPRESSIONS   Severe obstructive sleep apnea occurred during this study (AHI = 30.7/h).   No significant central sleep apnea occurred during this study (CAI = 0.3/h).   Severe oxygen desaturation was noted during this study (Min O2 = 85.00%).   The patient snored with Loud snoring volume.    No cardiac abnormalities were noted during this study.    Severe periodic limb movements of sleep occurred during the study. Associated arousals were significant.    Insomnia, sleep onset.  DIAGNOSIS  Obstructive Sleep Apnea (327.23 [G47.33 ICD-10])  Nocturnal Hypoxemia (327.26 [G47.36 ICD-10])  Insomnia, sleep onset.  RECOMMENDATIONS  Therapeutic CPAP titration to determine optimal pressure required to alleviate sleep disordered breathing.  Alternatively, may try patient on autoCPAP 5-15 cm H2O with heated humidification. Patient will need a mask fitting session to determine best mask fit. Patient was on CPAP 14 cm H2O.   Positional therapy avoiding supine position during sleep.  Avoid alcohol, sedatives and other CNS depressants that may worsen sleep apnea and disrupt normal sleep architecture.  Sleep hygiene should be reviewed to assess factors that may improve sleep quality.  Weight management and regular exercise should be initiated or continued if appropriate.   Monica Becton, MD 12/08/2015, 2:17 PM [place] [facility] Pager [phone] After  3 pm or if no answer, call [phone]

## 2015-12-09 ENCOUNTER — Encounter: Payer: Self-pay | Admitting: *Deleted

## 2015-12-09 ENCOUNTER — Telehealth: Payer: Self-pay | Admitting: *Deleted

## 2015-12-09 NOTE — Telephone Encounter (Signed)
Pre-Visit Call completed with patient and chart updated.   Pre-Visit Info documented in Specialty Comments under SnapShot.    

## 2015-12-11 ENCOUNTER — Other Ambulatory Visit: Payer: Self-pay | Admitting: Family Medicine

## 2015-12-12 ENCOUNTER — Ambulatory Visit (INDEPENDENT_AMBULATORY_CARE_PROVIDER_SITE_OTHER): Payer: BLUE CROSS/BLUE SHIELD | Admitting: Family Medicine

## 2015-12-12 ENCOUNTER — Encounter: Payer: Self-pay | Admitting: Family Medicine

## 2015-12-12 VITALS — BP 142/92 | HR 82 | Temp 97.8°F | Ht 71.0 in | Wt 332.0 lb

## 2015-12-12 DIAGNOSIS — G4733 Obstructive sleep apnea (adult) (pediatric): Secondary | ICD-10-CM | POA: Diagnosis not present

## 2015-12-12 DIAGNOSIS — F988 Other specified behavioral and emotional disorders with onset usually occurring in childhood and adolescence: Secondary | ICD-10-CM

## 2015-12-12 DIAGNOSIS — E785 Hyperlipidemia, unspecified: Secondary | ICD-10-CM

## 2015-12-12 DIAGNOSIS — IMO0002 Reserved for concepts with insufficient information to code with codable children: Secondary | ICD-10-CM

## 2015-12-12 DIAGNOSIS — F909 Attention-deficit hyperactivity disorder, unspecified type: Secondary | ICD-10-CM | POA: Diagnosis not present

## 2015-12-12 DIAGNOSIS — I10 Essential (primary) hypertension: Secondary | ICD-10-CM

## 2015-12-12 DIAGNOSIS — E1165 Type 2 diabetes mellitus with hyperglycemia: Secondary | ICD-10-CM

## 2015-12-12 DIAGNOSIS — F411 Generalized anxiety disorder: Secondary | ICD-10-CM

## 2015-12-12 DIAGNOSIS — E1151 Type 2 diabetes mellitus with diabetic peripheral angiopathy without gangrene: Secondary | ICD-10-CM

## 2015-12-12 MED ORDER — LISDEXAMFETAMINE DIMESYLATE 20 MG PO CAPS: 20.0000 mg | ORAL_CAPSULE | Freq: Every day | ORAL | Status: DC

## 2015-12-12 MED ORDER — METFORMIN HCL 1000 MG PO TABS: 1000.0000 mg | ORAL_TABLET | Freq: Two times a day (BID) | ORAL | Status: DC

## 2015-12-12 MED ORDER — METOPROLOL SUCCINATE ER 100 MG PO TB24: 100.0000 mg | ORAL_TABLET | Freq: Every day | ORAL | Status: DC

## 2015-12-12 MED ORDER — LORAZEPAM 1 MG PO TABS: 1.0000 mg | ORAL_TABLET | Freq: Three times a day (TID) | ORAL | Status: DC | PRN

## 2015-12-12 MED ORDER — PRAVASTATIN SODIUM 10 MG PO TABS: 10.0000 mg | ORAL_TABLET | Freq: Every day | ORAL | Status: DC

## 2015-12-12 MED ORDER — AMLODIPINE BESYLATE 10 MG PO TABS: 10.0000 mg | ORAL_TABLET | Freq: Every day | ORAL | Status: DC

## 2015-12-12 MED ORDER — GLIPIZIDE 10 MG PO TABS: ORAL_TABLET | ORAL | Status: DC

## 2015-12-12 NOTE — Assessment & Plan Note (Signed)
Pt has appointment with surgeon Dec 23, 2015

## 2015-12-12 NOTE — Assessment & Plan Note (Signed)
Per pulm Pt will be getting a cpap

## 2015-12-12 NOTE — Telephone Encounter (Signed)
Spoke with pt and gave results and recommendations. Pt would like to start CPAP therapy. Order placed. Pt aware to call and make f/u appt after starting CPAP.

## 2015-12-12 NOTE — Progress Notes (Signed)
Pre visit review using our clinic review tool, if applicable. No additional management support is needed unless otherwise documented below in the visit note. 

## 2015-12-12 NOTE — Patient Instructions (Signed)
Attention Deficit Hyperactivity Disorder Attention deficit hyperactivity disorder (ADHD) is a problem with behavior issues based on the way the brain functions (neurobehavioral disorder). It is a common reason for behavior and academic problems in school. SYMPTOMS  There are 3 types of ADHD. The 3 types and some of the symptoms include:  Inattentive.  Gets bored or distracted easily.  Loses or forgets things. Forgets to hand in homework.  Has trouble organizing or completing tasks.  Difficulty staying on task.  An inability to organize daily tasks and school work.  Leaving projects, chores, or homework unfinished.  Trouble paying attention or responding to details. Careless mistakes.  Difficulty following directions. Often seems like is not listening.  Dislikes activities that require sustained attention (like chores or homework).  Hyperactive-impulsive.  Feels like it is impossible to sit still or stay in a seat. Fidgeting with hands and feet.  Trouble waiting turn.  Talking too much or out of turn. Interruptive.  Speaks or acts impulsively.  Aggressive, disruptive behavior.  Constantly busy or on the go; noisy.  Often leaves seat when they are expected to remain seated.  Often runs or climbs where it is not appropriate, or feels very restless.  Combined.  Has symptoms of both of the above. Often children with ADHD feel discouraged about themselves and with school. They often perform well below their abilities in school. As children get older, the excess motor activities can calm down, but the problems with paying attention and staying organized persist. Most children do not outgrow ADHD but with good treatment can learn to cope with the symptoms. DIAGNOSIS  When ADHD is suspected, the diagnosis should be made by professionals trained in ADHD. This professional will collect information about the individual suspected of having ADHD. Information must be collected from  various settings where the person lives, works, or attends school.  Diagnosis will include:  Confirming symptoms began in childhood.  Ruling out other reasons for the child's behavior.  The health care providers will check with the child's school and check their medical records.  They will talk to teachers and parents.  Behavior rating scales for the child will be filled out by those dealing with the child on a daily basis. A diagnosis is made only after all information has been considered. TREATMENT  Treatment usually includes behavioral treatment, tutoring or extra support in school, and stimulant medicines. Because of the way a person's brain works with ADHD, these medicines decrease impulsivity and hyperactivity and increase attention. This is different than how they would work in a person who does not have ADHD. Other medicines used include antidepressants and certain blood pressure medicines. Most experts agree that treatment for ADHD should address all aspects of the person's functioning. Along with medicines, treatment should include structured classroom management at school. Parents should reward good behavior, provide constant discipline, and set limits. Tutoring should be available for the child as needed. ADHD is a lifelong condition. If untreated, the disorder can have long-term serious effects into adolescence and adulthood. HOME CARE INSTRUCTIONS   Often with ADHD there is a lot of frustration among family members dealing with the condition. Blame and anger are also feelings that are common. In many cases, because the problem affects the family as a whole, the entire family may need help. A therapist can help the family find better ways to handle the disruptive behaviors of the person with ADHD and promote change. If the person with ADHD is young, most of the therapist's   and anger are also feelings that are common. In many cases, because the problem affects the family as a whole, the entire family may need help. A therapist can help the family find better ways to handle the disruptive behaviors of the person with ADHD and promote change. If the person with ADHD is young, most of the therapist's work is with the parents. Parents will learn techniques for coping with and improving their child's behavior.  Sometimes only the child with the ADHD needs counseling. Your health care providers can help you make these decisions.  · Children with ADHD may need help learning how to organize. Some helpful tips include:  ¨ Keep routines the same every day from wake-up time to bedtime. Schedule all activities, including homework and playtime. Keep the schedule in a place where the person with ADHD will often see it. Mark schedule changes as far in advance as possible.  ¨ Schedule outdoor and indoor recreation.  ¨ Have a place for everything and keep everything in its place. This includes clothing, backpacks, and school supplies.  ¨ Encourage writing down assignments and bringing home needed books. Work with your child's teachers for assistance in organizing school work.  · Offer your child a well-balanced diet. Breakfast that includes a balance of whole grains, protein, and fruits or vegetables is especially important for school performance. Children should avoid drinks with caffeine including:  ¨ Soft drinks.  ¨ Coffee.  ¨ Tea.  ¨ However, some older children (adolescents) may find these drinks helpful in improving their attention. Because it can also be common for adolescents with ADHD to become addicted to caffeine, talk with your health care provider about what is a safe amount of caffeine intake for your child.  · Children with ADHD need consistent rules that they can understand and follow. If rules are followed, give small rewards. Children with ADHD often receive, and expect, criticism. Look for good behavior and praise it. Set realistic goals. Give clear instructions. Look for activities that can foster success and self-esteem. Make time for pleasant activities with your child. Give lots of affection.  · Parents are their children's greatest advocates. Learn as much as possible about ADHD. This helps you become a stronger and better advocate for your child. It also helps you educate your child's teachers and instructors  if they feel inadequate in these areas. Parent support groups are often helpful. A national group with local chapters is called Children and Adults with Attention Deficit Hyperactivity Disorder (CHADD).  SEEK MEDICAL CARE IF:  · Your child has repeated muscle twitches, cough, or speech outbursts.  · Your child has sleep problems.  · Your child has a marked loss of appetite.  · Your child develops depression.  · Your child has new or worsening behavioral problems.  · Your child develops dizziness.  · Your child has a racing heart.  · Your child has stomach pains.  · Your child develops headaches.  SEEK IMMEDIATE MEDICAL CARE IF:  · Your child has been diagnosed with depression or anxiety and the symptoms seem to be getting worse.  · Your child has been depressed and suddenly appears to have increased energy or motivation.  · You are worried that your child is having a bad reaction to a medication he or she is taking for ADHD.     This information is not intended to replace advice given to you by your health care provider. Make sure you discuss any questions you have with your

## 2015-12-12 NOTE — Progress Notes (Signed)
Patient ID: Tyler Brewer, male    DOB: 1961-06-23  Age: 55 y.o. MRN: [id number]    Subjective:  Subjective HPI Tyler Brewer presents for f/u vyvanse.  30 mg makes him anxious and he clenches his teeth. He has been to to informational seminar for weight loss surgery at [facility] ----he understands the lifestyle modifications that are necessary and understands the risks involved.  He has tried several diets over the years, including:  First Place, Atkins, Body by Vi,  Weight watchers , with very little success.    HYPERTENSION  Blood pressure range-not checking  Chest pain- no      Dyspnea- no Lightheadedness- no   Edema- no Other side effects - no   Medication compliance: good Low salt diet- yes  DIABETES  Blood Sugar ranges-100-120  Polyuria- no New Visual problems- no Hypoglycemic symptoms- no Other side effects-no Medication compliance - good Last eye exam- 09/2014 Foot exam- last ov  HYPERLIPIDEMIA  Medication compliance- no RUQ pain- no  Muscle aches- no Other side effects-no    Review of Systems  Constitutional: Negative for diaphoresis, appetite change, fatigue and unexpected weight change.  Eyes: Negative for pain, redness and visual disturbance.  Respiratory: Negative for cough, chest tightness, shortness of breath and wheezing.   Cardiovascular: Negative for chest pain, palpitations and leg swelling.  Endocrine: Negative for cold intolerance, heat intolerance, polydipsia, polyphagia and polyuria.  Genitourinary: Negative for dysuria, frequency and difficulty urinating.  Neurological: Negative for dizziness, light-headedness, numbness and headaches.    History Past Medical History  Diagnosis Date  . Hypertension   . Diabetes mellitus     poorly controlled by his report  . IBS (irritable bowel syndrome)   . History of narcotic addiction ([place])     past history of back pain  . Hypercholesteremia   . Testosterone deficiency   . Depression   .  Obesity     Max weight was 390  . CAD (coronary artery disease)  cardiac cath with moderate disease in a septal branch of the ramus intermedius 04/01/2012  . Panic attacks   . OSA on CPAP     He has past surgical history that includes Cardiac catheterization and left heart catheterization with coronary angiogram (N/A, 03/31/2012).   His family history includes Diabetes type II in his father; Healthy in his daughter, mother, sister, and son; Hypertension in his father; Pancreatic disease (age of onset: 61) in his father.He reports that he quit smoking about 3 weeks ago. He has never used smokeless tobacco. He reports that he does not drink alcohol or use illicit drugs.  Current Outpatient Prescriptions on File Prior to Visit  Medication Sig Dispense Refill  . furosemide (LASIX) 40 MG tablet Take 1 tablet (40 mg total) by mouth 2 (two) times daily. 60 tablet 3  . Liraglutide (VICTOZA) 18 MG/3ML SOPN 1.8 mg sq qd 6 mL 3  . testosterone cypionate (DEPOTESTOSTERONE CYPIONATE) 200 MG/ML injection   4  . traMADol (ULTRAM) 50 MG tablet Take 2 tablets (100 mg total) by mouth 3 (three) times daily as needed for moderate pain. 90 tablet 1  . triamcinolone cream (KENALOG) 0.1 % Apply 1 application topically 2 (two) times daily. (Patient taking differently: Apply 1 application topically 2 (two) times daily as needed. ) 30 g 0  . vitamin B-12 (CYANOCOBALAMIN) 1000 MCG tablet Take 1,000 mcg by mouth daily.     No current facility-administered medications on file prior to visit.     Objective:  Objective Physical Exam  Constitutional: He is oriented to person, place, and time. Vital signs are normal. He appears well-developed and well-nourished. He is sleeping.  HENT:  Head: Normocephalic and atraumatic.  Mouth/Throat: Oropharynx is clear and moist.  Eyes: EOM are normal. Pupils are equal, round, and reactive to light.  Neck: Normal range of motion. Neck supple. No thyromegaly present.  Cardiovascular:  Normal rate and regular rhythm.   No murmur heard. Pulmonary/Chest: Effort normal and breath sounds normal. No respiratory distress. He has no wheezes. He has no rales. He exhibits no tenderness.  Musculoskeletal: He exhibits no edema or tenderness.  Neurological: He is alert and oriented to person, place, and time.  Skin: Skin is warm and dry.  Psychiatric: He has a normal mood and affect. His behavior is normal. Judgment and thought content normal.  Nursing note and vitals reviewed.  BP 142/92 mmHg  Pulse 82  Temp(Src) 97.8 F (36.6 C) (Oral)  Ht 5\' 11"  (1.803 m)  Wt 332 lb (150.594 kg)  BMI 46.33 kg/m2  SpO2 98% Wt Readings from Last 3 Encounters:  12/12/15 332 lb (150.594 kg)  12/02/15 325 lb (147.419 kg)  11/30/15 333 lb (151.048 kg)     Lab Results  Component Value Date   WBC 5.1 09/01/2015   HGB 13.6 09/01/2015   HCT 40.8 09/01/2015   PLT 267.0 09/01/2015   GLUCOSE 181* 11/21/2015   CHOL 123 09/01/2015   TRIG 136.0 09/01/2015   HDL 34.00* 09/01/2015   LDLCALC 62 09/01/2015   ALT 18 09/01/2015   AST 13 09/01/2015   NA 139 11/21/2015   K 3.3* 11/21/2015   CL 100 11/21/2015   CREATININE 1.03 11/21/2015   BUN 14 11/21/2015   CO2 29 11/21/2015   TSH 2.01 11/07/2015   INR 1.14 03/31/2012   HGBA1C 8.0* 09/01/2015    US Venous Img Lower Bilateral  11/21/2015  CLINICAL DATA:  Bilateral lower extremity swelling and pain. EXAM: BILATERAL LOWER EXTREMITY VENOUS DOPPLER ULTRASOUND TECHNIQUE: Gray-scale sonography with graded compression, as well as color Doppler and duplex ultrasound were performed to evaluate the lower extremity deep venous systems from the level of the common femoral vein and including the common femoral, femoral, profunda femoral, popliteal and calf veins including the posterior tibial, peroneal and gastrocnemius veins when visible. The superficial great saphenous vein was also interrogated. Spectral Doppler was utilized to evaluate flow at rest and with  distal augmentation maneuvers in the common femoral, femoral and popliteal veins. COMPARISON:  None. FINDINGS: RIGHT LOWER EXTREMITY Common Femoral Vein: No evidence of thrombus. Normal compressibility, respiratory phasicity and response to augmentation. Saphenofemoral Junction: No evidence of thrombus. Normal compressibility and flow on color Doppler imaging. Profunda Femoral Vein: No evidence of thrombus. Normal compressibility and flow on color Doppler imaging. Femoral Vein: No evidence of thrombus. Normal compressibility, respiratory phasicity and response to augmentation. Popliteal Vein: No evidence of thrombus. Normal compressibility, respiratory phasicity and response to augmentation. Calf Veins: No evidence of thrombus. Normal compressibility and flow on color Doppler imaging. Superficial Great Saphenous Vein: No evidence of thrombus. Normal compressibility and flow on color Doppler imaging. Venous Reflux:  None. Other Findings:  None. LEFT LOWER EXTREMITY Common Femoral Vein: No evidence of thrombus. Normal compressibility, respiratory phasicity and response to augmentation. Saphenofemoral Junction: No evidence of thrombus. Normal compressibility and flow on color Doppler imaging. Profunda Femoral Vein: No evidence of thrombus. Normal compressibility and flow on color Doppler imaging. Femoral Vein: No evidence of thrombus. Normal compressibility,  respiratory phasicity and response to augmentation. Popliteal Vein: No evidence of thrombus. Normal compressibility, respiratory phasicity and response to augmentation. Calf Veins: No evidence of thrombus. Normal compressibility and flow on color Doppler imaging. Superficial Great Saphenous Vein: No evidence of thrombus. Normal compressibility and flow on color Doppler imaging. Venous Reflux:  None. Other Findings:  None. IMPRESSION: No evidence of deep venous thrombosis. Electronically Signed   By: Rolm Baptise M.D.   On: 11/21/2015 14:55     Assessment & Plan:   Plan I have discontinued Mr. Fraga lisdexamfetamine. I have also changed his LORazepam, pravastatin, metFORMIN, and amLODipine. Additionally, I am having him start on lisdexamfetamine. Lastly, I am having him maintain his triamcinolone cream, testosterone cypionate, furosemide, Liraglutide, traMADol, vitamin B-12, metoprolol succinate, and glipiZIDE.  Meds ordered this encounter  Medications  . lisdexamfetamine (VYVANSE) 20 MG capsule    Sig: Take 1 capsule (20 mg total) by mouth daily.    Dispense:  30 capsule    Refill:  0  . metoprolol succinate (TOPROL-XL) 100 MG 24 hr tablet    Sig: Take 1 tablet (100 mg total) by mouth daily. Take with or immediately following a meal.    Dispense:  90 tablet    Refill:  3  . LORazepam (ATIVAN) 1 MG tablet    Sig: Take 1 tablet (1 mg total) by mouth 3 (three) times daily as needed for anxiety. Reported on 12/09/2015    Dispense:  30 tablet    Refill:  1  . pravastatin (PRAVACHOL) 10 MG tablet    Sig: Take 1 tablet (10 mg total) by mouth daily. Reported on 12/09/2015  . metFORMIN (GLUCOPHAGE) 1000 MG tablet    Sig: Take 1 tablet (1,000 mg total) by mouth 2 (two) times daily with a meal.  . glipiZIDE (GLUCOTROL) 10 MG tablet    Sig: TAKE ONE TABLET BY MOUTH TWICE A DAY BEFORE A MEAL    Dispense:  60 tablet    Refill:  2  . amLODipine (NORVASC) 10 MG tablet    Sig: Take 1 tablet (10 mg total) by mouth daily.    Refill:  3    Problem List Items Addressed This Visit      Unprioritized   OSA (obstructive sleep apnea) (Chronic)    Per pulm Pt will be getting a cpap      HTN (hypertension) (Chronic)   Relevant Medications   metoprolol succinate (TOPROL-XL) 100 MG 24 hr tablet   pravastatin (PRAVACHOL) 10 MG tablet   amLODipine (NORVASC) 10 MG tablet   Morbid obesity (HCC)    Pt has appointment with surgeon Dec 23, 2015      Relevant Medications   lisdexamfetamine (VYVANSE) 20 MG capsule   metFORMIN (GLUCOPHAGE) 1000 MG tablet    glipiZIDE (GLUCOTROL) 10 MG tablet   ADD (attention deficit disorder) - Primary   Relevant Medications   lisdexamfetamine (VYVANSE) 20 MG capsule    Other Visit Diagnoses    DM (diabetes mellitus) type II uncontrolled, periph vascular disorder (HCC)        Relevant Medications    metoprolol succinate (TOPROL-XL) 100 MG 24 hr tablet    pravastatin (PRAVACHOL) 10 MG tablet    metFORMIN (GLUCOPHAGE) 1000 MG tablet    glipiZIDE (GLUCOTROL) 10 MG tablet    amLODipine (NORVASC) 10 MG tablet    Other Relevant Orders    Hemoglobin A1c    Comprehensive metabolic panel    Hyperlipidemia  Relevant Medications    metoprolol succinate (TOPROL-XL) 100 MG 24 hr tablet    pravastatin (PRAVACHOL) 10 MG tablet    amLODipine (NORVASC) 10 MG tablet    Other Relevant Orders    Lipid panel    Generalized anxiety disorder        Relevant Medications    LORazepam (ATIVAN) 1 MG tablet       Follow-up: Return in about 6 months (around 06/12/2016), or if symptoms worsen or fail to improve.  [name], DO

## 2015-12-14 ENCOUNTER — Ambulatory Visit (HOSPITAL_BASED_OUTPATIENT_CLINIC_OR_DEPARTMENT_OTHER)
Admission: RE | Admit: 2015-12-14 | Discharge: 2015-12-14 | Disposition: A | Discharge status: Home / Self Care | Payer: BLUE CROSS/BLUE SHIELD | Source: Ambulatory Visit | Attending: Family Medicine | Admitting: Family Medicine

## 2015-12-14 DIAGNOSIS — E785 Hyperlipidemia, unspecified: Secondary | ICD-10-CM | POA: Insufficient documentation

## 2015-12-14 DIAGNOSIS — I119 Hypertensive heart disease without heart failure: Secondary | ICD-10-CM | POA: Diagnosis not present

## 2015-12-14 DIAGNOSIS — R0600 Dyspnea, unspecified: Secondary | ICD-10-CM | POA: Diagnosis present

## 2015-12-14 DIAGNOSIS — R0609 Other forms of dyspnea: Secondary | ICD-10-CM | POA: Diagnosis not present

## 2015-12-14 DIAGNOSIS — G4733 Obstructive sleep apnea (adult) (pediatric): Secondary | ICD-10-CM | POA: Insufficient documentation

## 2015-12-14 DIAGNOSIS — Z6841 Body Mass Index (BMI) 40.0 and over, adult: Secondary | ICD-10-CM | POA: Insufficient documentation

## 2015-12-14 DIAGNOSIS — E1165 Type 2 diabetes mellitus with hyperglycemia: Secondary | ICD-10-CM | POA: Diagnosis not present

## 2015-12-14 DIAGNOSIS — F411 Generalized anxiety disorder: Secondary | ICD-10-CM | POA: Diagnosis not present

## 2015-12-14 IMAGING — CR DG CHEST 2V
2 series · 2 of 2 positions shown · non-contrast
Comparison: PA and lateral chest x-ray [DATE]

CLINICAL DATA: Shortness of breath, preoperative examination,
history of sleep apnea, current smoker, diabetes.

EXAM:
CHEST  2 VIEW

[w chest pa]
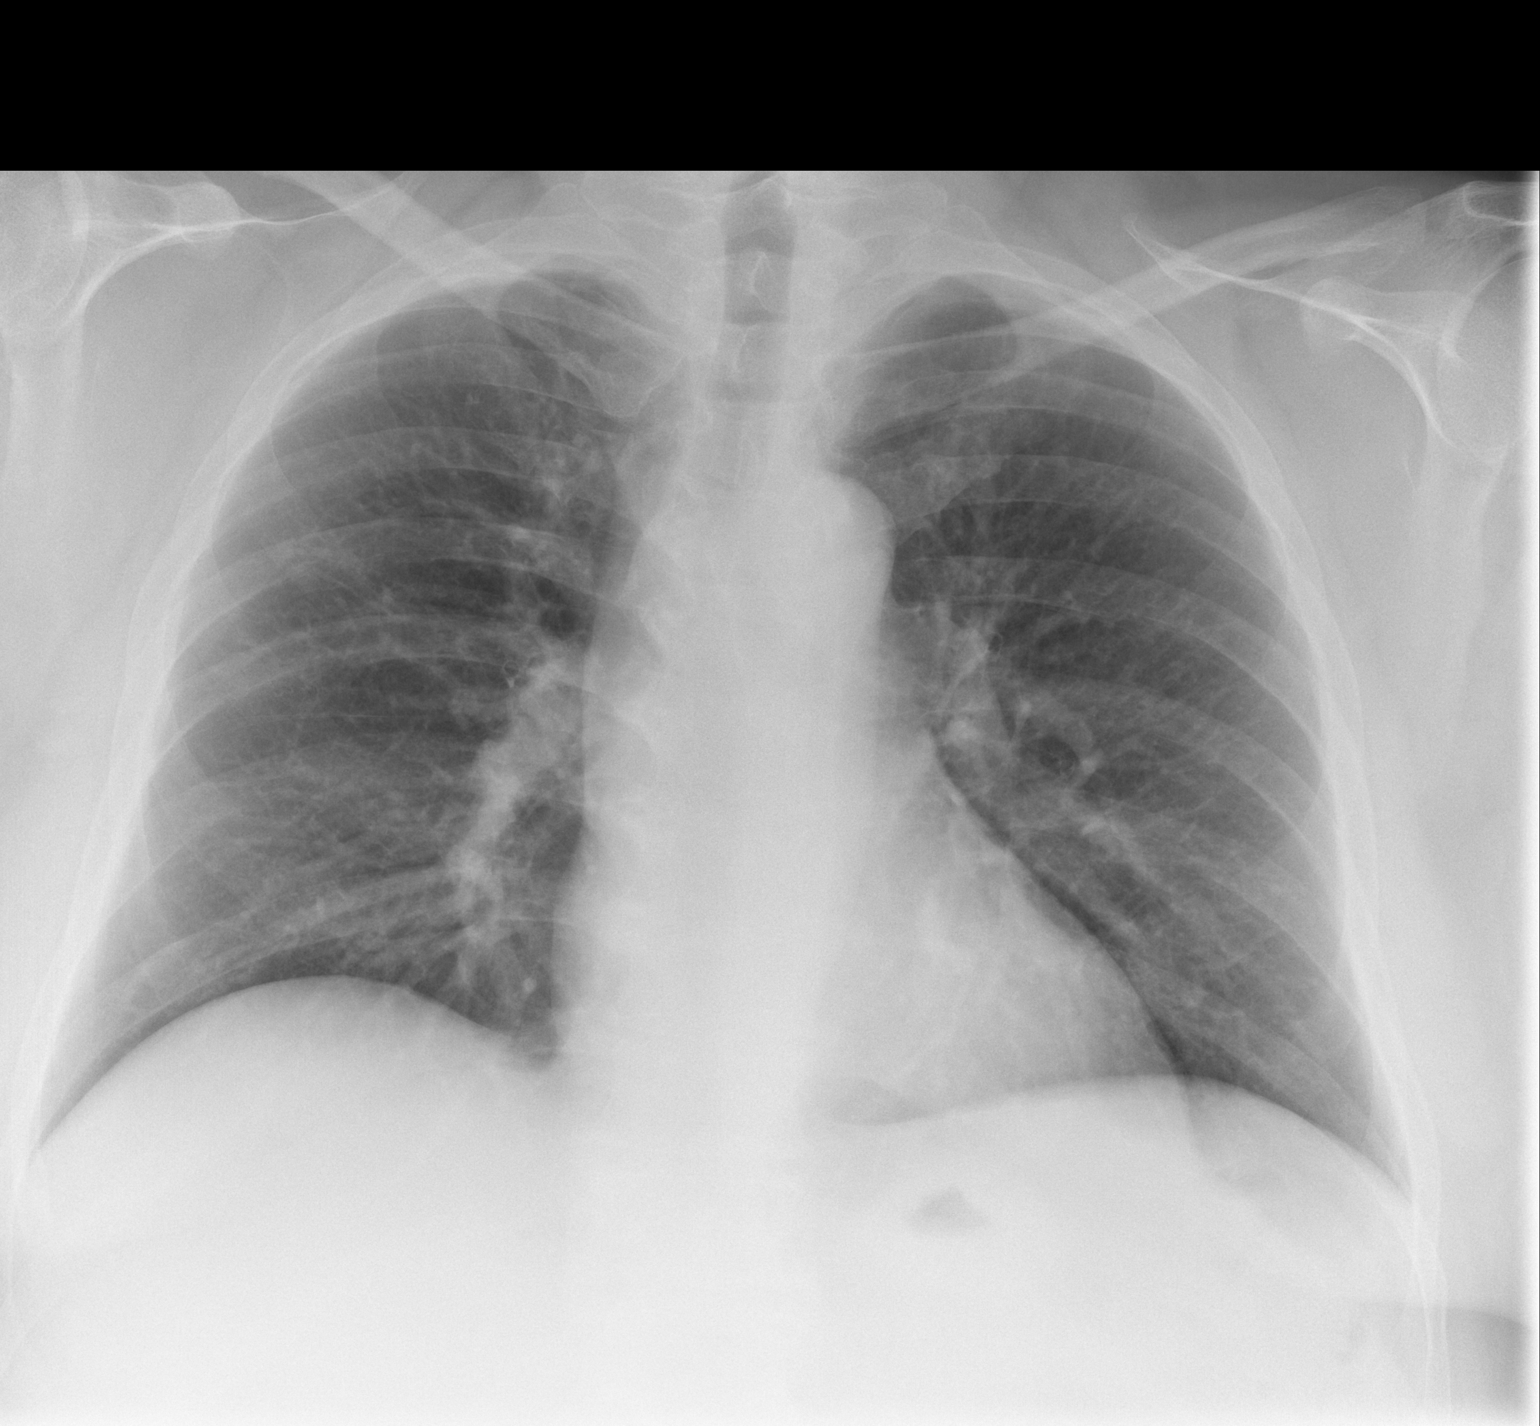

[w chest lat]
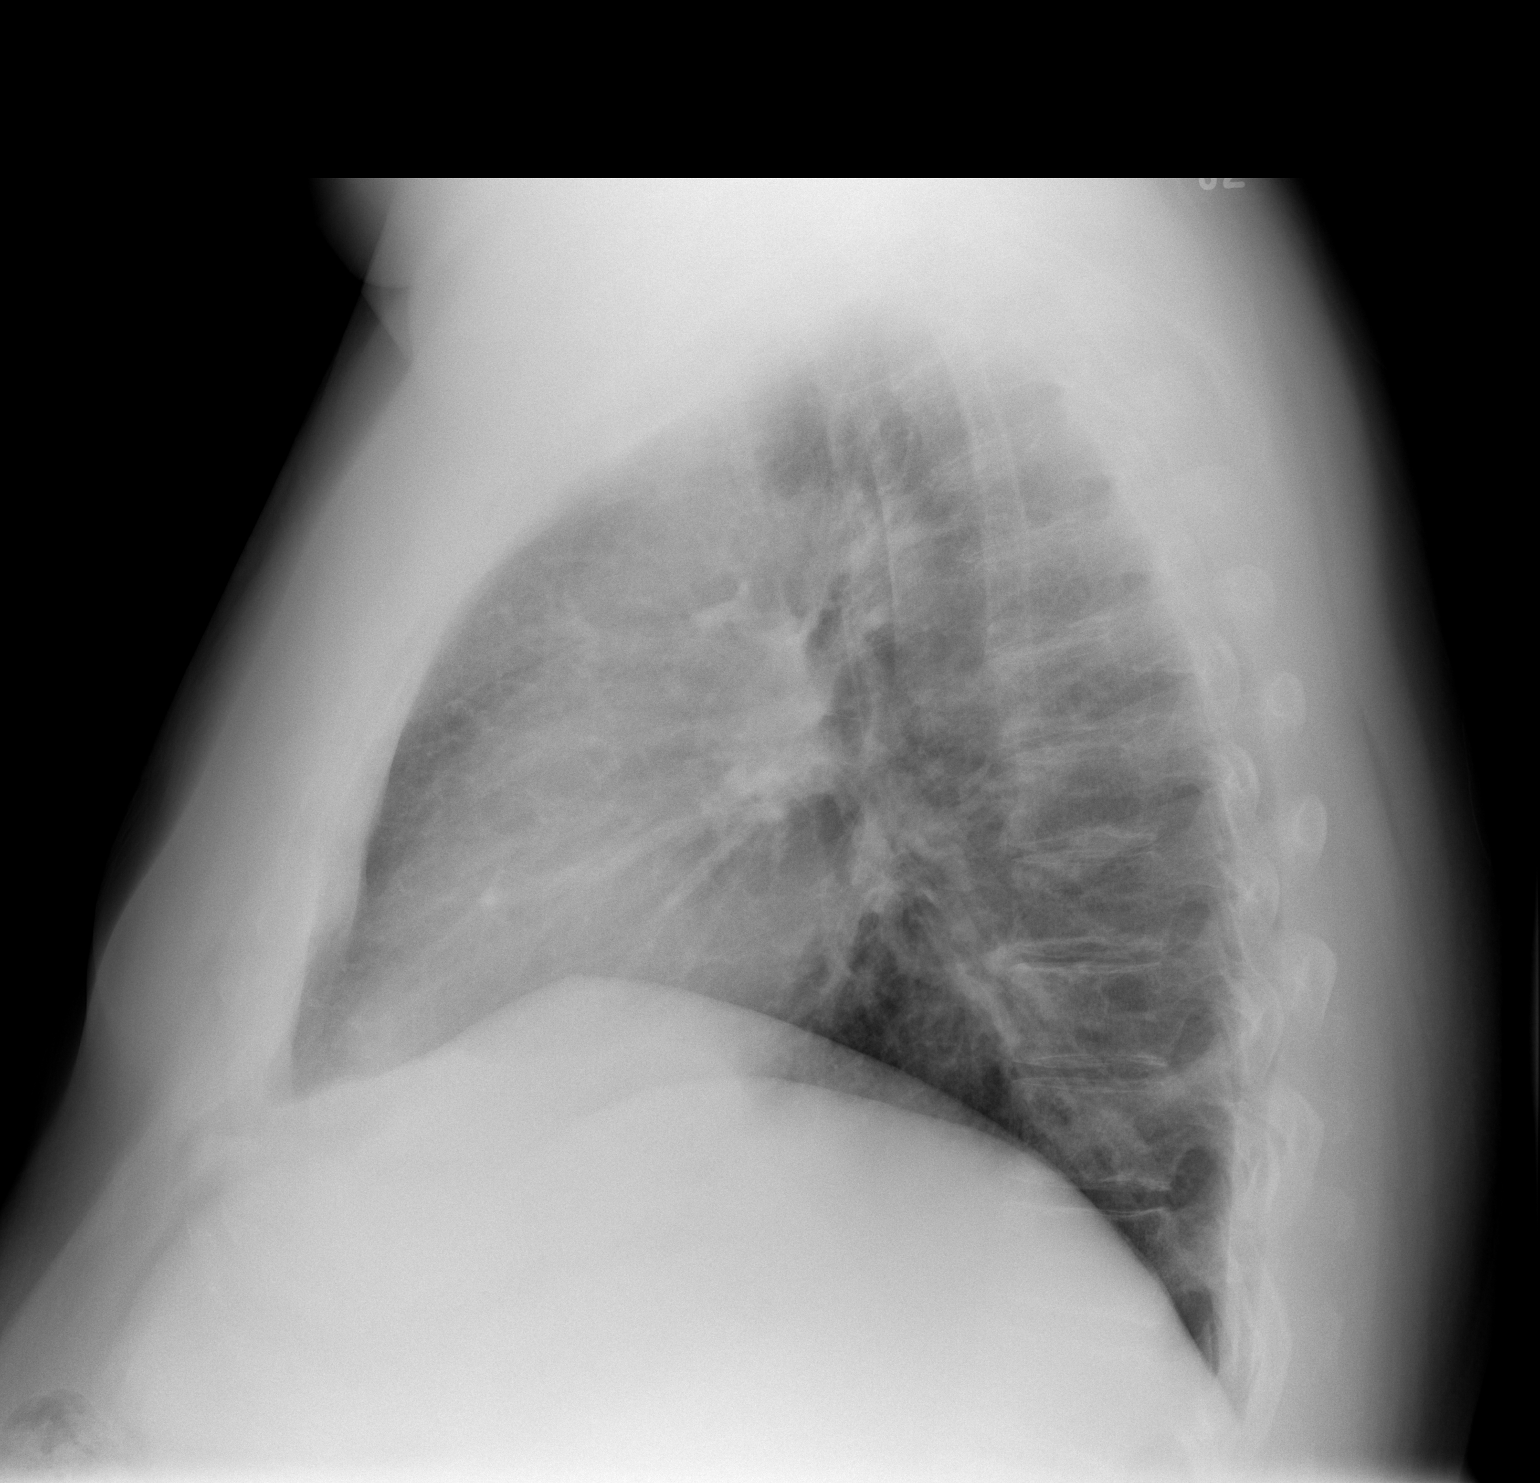

[2 of 2 positions shown; findings below may reference images not displayed]

FINDINGS: The lungs are adequately inflated. There is no focal infiltrate.
There is no pleural effusion. The heart and pulmonary vascularity
are normal. The mediastinum is normal in width. The bony thorax is
unremarkable.
IMPRESSION: There is no active cardiopulmonary disease.

## 2015-12-14 NOTE — Progress Notes (Signed)
  Echocardiogram 2D Echocardiogram has been performed.  Tyler Brewer 12/14/2015, 12:19 PM

## 2015-12-19 ENCOUNTER — Encounter: Payer: Self-pay | Admitting: Family Medicine

## 2015-12-19 MED ORDER — "SYRINGE/NEEDLE (DISP) 21G X 1"" 3 ML MISC": Status: DC

## 2015-12-19 NOTE — Addendum Note (Signed)
Addended by: Ewing Schlein on: 12/19/2015 02:37 PM   Modules accepted: Orders

## 2015-12-19 NOTE — Telephone Encounter (Signed)
We will need to see his formulary --- or he can call the ins co

## 2016-01-10 ENCOUNTER — Emergency Department (HOSPITAL_BASED_OUTPATIENT_CLINIC_OR_DEPARTMENT_OTHER)
Admission: EM | Admit: 2016-01-10 | Discharge: 2016-01-11 | Disposition: A | Discharge status: Home / Self Care | Payer: BLUE CROSS/BLUE SHIELD | Attending: Emergency Medicine | Admitting: Emergency Medicine

## 2016-01-10 ENCOUNTER — Encounter (HOSPITAL_BASED_OUTPATIENT_CLINIC_OR_DEPARTMENT_OTHER): Payer: Self-pay | Admitting: Emergency Medicine

## 2016-01-10 DIAGNOSIS — I10 Essential (primary) hypertension: Secondary | ICD-10-CM | POA: Diagnosis not present

## 2016-01-10 DIAGNOSIS — F329 Major depressive disorder, single episode, unspecified: Secondary | ICD-10-CM | POA: Insufficient documentation

## 2016-01-10 DIAGNOSIS — Z87891 Personal history of nicotine dependence: Secondary | ICD-10-CM | POA: Insufficient documentation

## 2016-01-10 DIAGNOSIS — Z6841 Body Mass Index (BMI) 40.0 and over, adult: Secondary | ICD-10-CM | POA: Diagnosis not present

## 2016-01-10 DIAGNOSIS — E669 Obesity, unspecified: Secondary | ICD-10-CM | POA: Diagnosis not present

## 2016-01-10 DIAGNOSIS — M25472 Effusion, left ankle: Secondary | ICD-10-CM | POA: Diagnosis not present

## 2016-01-10 DIAGNOSIS — R609 Edema, unspecified: Secondary | ICD-10-CM

## 2016-01-10 DIAGNOSIS — Z7984 Long term (current) use of oral hypoglycemic drugs: Secondary | ICD-10-CM | POA: Insufficient documentation

## 2016-01-10 DIAGNOSIS — I2510 Atherosclerotic heart disease of native coronary artery without angina pectoris: Secondary | ICD-10-CM | POA: Diagnosis not present

## 2016-01-10 DIAGNOSIS — M25471 Effusion, right ankle: Secondary | ICD-10-CM | POA: Diagnosis not present

## 2016-01-10 DIAGNOSIS — E119 Type 2 diabetes mellitus without complications: Secondary | ICD-10-CM | POA: Diagnosis not present

## 2016-01-10 DIAGNOSIS — Z79899 Other long term (current) drug therapy: Secondary | ICD-10-CM | POA: Insufficient documentation

## 2016-01-10 DIAGNOSIS — M79605 Pain in left leg: Secondary | ICD-10-CM | POA: Diagnosis present

## 2016-01-10 LAB — BASIC METABOLIC PANEL
Anion gap: 8 (ref 5–15)
BUN: 16 mg/dL (ref 6–20)
CO2: 24 mmol/L (ref 22–32)
Calcium: 8.9 mg/dL (ref 8.9–10.3)
Chloride: 105 mmol/L (ref 101–111)
Creatinine, Ser: 1.2 mg/dL (ref 0.61–1.24)
GFR calc Af Amer: >60 mL/min (ref >60)
GFR calc non Af Amer: >60 mL/min (ref >60)
Glucose, Bld: 224 mg/dL — ABNORMAL HIGH (ref 65–99)
Potassium: 3.7 mmol/L (ref 3.5–5.1)
Sodium: 137 mmol/L (ref 135–145)

## 2016-01-10 MED ORDER — NAPROXEN 250 MG PO TABS
500.0000 mg | ORAL_TABLET | Freq: Once | ORAL | Status: AC
  Administered 2016-01-10: 500 mg via ORAL
  Filled 2016-01-10: qty 2

## 2016-01-10 NOTE — ED Notes (Signed)
Pt in c/o bilateral LE swelling x 3 days. Has increased Lasix with little improvement. Swelling noted on exam.

## 2016-01-10 NOTE — ED Provider Notes (Signed)
CSN: [id number]     Arrival date & time 01/10/16  2159 History  By signing my name below, I, Irene Pap, attest that this documentation has been prepared under the direction and in the presence of Promise Weldin, MD. Electronically Signed: Irene Pap, ED Scribe. 01/10/2016. 11:21 PM.   Chief Complaint  Patient presents with  . Leg Swelling  . Leg Pain   Patient is a 55 y.o. male presenting with leg pain. The history is provided by the patient. No language interpreter was used.  Leg Pain Location:  Leg Time since incident:  3 days Injury: no   Leg location:  L leg and R leg Pain details:    Quality:  Throbbing   Severity:  Moderate   Onset quality:  Gradual   Duration:  3 days   Timing:  Constant   Progression:  Worsening Chronicity:  Recurrent Dislocation: no   Foreign body present:  No foreign bodies Prior injury to area:  No Ineffective treatments:  Acetaminophen Associated symptoms: swelling   Risk factors: obesity   HPI Comments: Tyler Brewer is a 55 y.o. male with a hx of HTN, DM, CAD, and narcotic addiction who presents to the Emergency Department complaining of bilateral leg pain onset 3 days ago. He reports associated swelling to the legs. He states that he has increased his Lasix, taken Tramadol and Ibuprofen for pain to no relief. He states that he is not able to sleep due to pain. Pt states that he has been traveling back and forth to [place] by car. Pt recently had a US done for the same with no results. He denies chest pain or SOB.   Past Medical History  Diagnosis Date  . Hypertension   . Diabetes mellitus     poorly controlled by his report  . IBS (irritable bowel syndrome)   . History of narcotic addiction ([place])     past history of back pain  . Hypercholesteremia   . Testosterone deficiency   . Depression   . Obesity     Max weight was 390  . CAD (coronary artery disease)  cardiac cath with moderate disease in a septal branch of the ramus  intermedius 04/01/2012  . Panic attacks   . OSA on CPAP    Past Surgical History  Procedure Laterality Date  . Cardiac catheterization    . Left heart catheterization with coronary angiogram N/A 03/31/2012    Procedure: LEFT HEART CATHETERIZATION WITH CORONARY ANGIOGRAM;  Surgeon: Leonie Man, MD;  Location: [facility] CATH LAB;  Service: Cardiovascular;  Laterality: N/A;   Family History  Problem Relation Age of Onset  . Diabetes type II Father   . Hypertension Father   . Pancreatic disease Father 85    Deceased  . Healthy Mother   . Healthy Sister   . Healthy Son   . Healthy Daughter    Social History  Substance Use Topics  . Smoking status: Former Smoker    Quit date: 11/21/2015  . Smokeless tobacco: Never Used  . Alcohol Use: No    Review of Systems  Respiratory: Negative for shortness of breath.   Cardiovascular: Positive for leg swelling. Negative for chest pain.  Musculoskeletal: Positive for arthralgias.  All other systems reviewed and are negative.  Allergies  Review of patient's allergies indicates no known allergies.  Home Medications   Prior to Admission medications   Medication Sig Start Date End Date Taking? Authorizing Provider  amLODipine (NORVASC) 10 MG tablet  Take 1 tablet (10 mg total) by mouth daily. 12/12/15   Rosalita Chessman Chase, DO  furosemide (LASIX) 40 MG tablet Take 1 tablet (40 mg total) by mouth 2 (two) times daily. 11/01/15   Alferd Apa Lowne Chase, DO  glipiZIDE (GLUCOTROL) 10 MG tablet TAKE ONE TABLET BY MOUTH TWICE A DAY BEFORE A MEAL 12/12/15   Alferd Apa Lowne Chase, DO  Liraglutide (VICTOZA) 18 MG/3ML SOPN 1.8 mg sq qd 11/01/15   Alferd Apa Lowne Chase, DO  lisdexamfetamine (VYVANSE) 20 MG capsule Take 1 capsule (20 mg total) by mouth daily. 12/12/15   Alferd Apa Lowne Chase, DO  LORazepam (ATIVAN) 1 MG tablet Take 1 tablet (1 mg total) by mouth 3 (three) times daily as needed for anxiety. Reported on 12/09/2015 12/12/15   Rosalita Chessman Chase, DO   metFORMIN (GLUCOPHAGE) 1000 MG tablet Take 1 tablet (1,000 mg total) by mouth 2 (two) times daily with a meal. 12/12/15   Rosalita Chessman Chase, DO  metoprolol succinate (TOPROL-XL) 100 MG 24 hr tablet Take 1 tablet (100 mg total) by mouth daily. Take with or immediately following a meal. 12/12/15   Rosalita Chessman Chase, DO  pravastatin (PRAVACHOL) 10 MG tablet Take 1 tablet (10 mg total) by mouth daily. Reported on 12/09/2015 12/12/15   Alferd Apa Lowne Chase, DO  SYRINGE-NEEDLE, DISP, 3 ML (BD ECLIPSE SYRINGE) 21G X 1" 3 ML MISC Inject Testosterone twice a month as directed 12/19/15   [name], DO  testosterone cypionate (DEPOTESTOSTERONE CYPIONATE) 200 MG/ML injection  10/21/15   Historical Provider, MD  traMADol (ULTRAM) 50 MG tablet Take 2 tablets (100 mg total) by mouth 3 (three) times daily as needed for moderate pain. 12/01/15   Rosalita Chessman Chase, DO  triamcinolone cream (KENALOG) 0.1 % Apply 1 application topically 2 (two) times daily. Patient taking differently: Apply 1 application topically 2 (two) times daily as needed.  09/29/15   Rosalita Chessman Chase, DO  vitamin B-12 (CYANOCOBALAMIN) 1000 MCG tablet Take 1,000 mcg by mouth daily.    Historical Provider, MD   BP 152/101 mmHg  Pulse 85  Temp(Src) 98 F (36.7 C) (Oral)  Resp 20  Ht 5\' 11"  (1.803 m)  Wt 330 lb (149.687 kg)  BMI 46.05 kg/m2  SpO2 99% Physical Exam  Constitutional: He is oriented to person, place, and time. He appears well-developed and well-nourished. No distress.  HENT:  Head: Normocephalic and atraumatic.  Mouth/Throat: Oropharynx is clear and moist. No oropharyngeal exudate.  Trachea midline  Eyes: Conjunctivae and EOM are normal. Pupils are equal, round, and reactive to light.  Neck: Trachea normal and normal range of motion. Neck supple. No JVD present. Carotid bruit is not present.  Cardiovascular: Normal rate and regular rhythm.  Exam reveals no gallop and no friction rub.   No murmur  heard. Pulmonary/Chest: Effort normal and breath sounds normal. No stridor. He has no wheezes. He has no rales.  Abdominal: Soft. Bowel sounds are normal. He exhibits no mass. There is no tenderness. There is no rebound and no guarding.  Musculoskeletal: Normal range of motion. He exhibits edema. He exhibits no tenderness.  Non-pitting bilateral edema to the ankles; All compartments are soft; intact cap refill; intact DP pulses  Lymphadenopathy:    He has no cervical adenopathy.  Neurological: He is alert and oriented to person, place, and time. He has normal reflexes. No cranial nerve deficit. He exhibits normal muscle tone. Coordination normal.  Cranial nerves 2-12 intact  Skin: Skin is warm and dry. He is not diaphoretic.  Psychiatric: He has a normal mood and affect. His behavior is normal.    ED Course  Procedures (including critical care time) DIAGNOSTIC STUDIES: Oxygen Saturation is 99% on RA, normal by my interpretation.    COORDINATION OF CARE: 11:19 PM-Discussed treatment plan which includes labs with pt at bedside and pt agreed to plan.    Labs Review Labs Reviewed  BASIC METABOLIC PANEL - Abnormal; Notable for the following:    Glucose, Bld 224 (*)    All other components within normal limits    Imaging Review No results found. I have personally reviewed and evaluated these images and lab results as part of my medical decision-making.   EKG Interpretation None      MDM  Doubt blood clot in the legs due to pt's negative ultrasound performed weeks ago with no changes in symptoms. Recommend that pt use compression stockings, ice and follow up with PCP.  Filed Vitals:   01/10/16 2203  BP: 152/101  Pulse: 85  Temp: 98 F (36.7 C)  Resp: 20   Results for orders placed or performed during the hospital encounter of 123456  Basic metabolic panel  Result Value Ref Range   Sodium 137 135 - 145 mmol/L   Potassium 3.7 3.5 - 5.1 mmol/L   Chloride 105 101 - 111  mmol/L   CO2 24 22 - 32 mmol/L   Glucose, Bld 224 (H) 65 - 99 mg/dL   BUN 16 6 - 20 mg/dL   Creatinine, Ser 1.20 0.61 - 1.24 mg/dL   Calcium 8.9 8.9 - 10.3 mg/dL   GFR calc non Af Amer >60 >60 mL/min   GFR calc Af Amer >60 >60 mL/min   Anion gap 8 5 - 15   Dg Chest 2 View  12/14/2015  CLINICAL DATA:  Shortness of breath, preoperative examination, history of sleep apnea, current smoker, diabetes. EXAM: CHEST  2 VIEW COMPARISON:  PA and lateral chest x-ray of March 30, 2012 FINDINGS: The lungs are adequately inflated. There is no focal infiltrate. There is no pleural effusion. The heart and pulmonary vascularity are normal. The mediastinum is normal in width. The bony thorax is unremarkable. IMPRESSION: There is no active cardiopulmonary disease. Electronically Signed   By: David  Martinique M.D.   On: 12/14/2015 11:47    Medications  naproxen (NAPROSYN) tablet 500 mg (not administered)    Final diagnoses:  None   Filed Vitals:   01/10/16 2203 01/11/16 0043  BP: 152/101 125/80  Pulse: 85 73  Temp: 98 F (36.7 C)   Resp: 20 18   Results for orders placed or performed during the hospital encounter of 123456  Basic metabolic panel  Result Value Ref Range   Sodium 137 135 - 145 mmol/L   Potassium 3.7 3.5 - 5.1 mmol/L   Chloride 105 101 - 111 mmol/L   CO2 24 22 - 32 mmol/L   Glucose, Bld 224 (H) 65 - 99 mg/dL   BUN 16 6 - 20 mg/dL   Creatinine, Ser 1.20 0.61 - 1.24 mg/dL   Calcium 8.9 8.9 - 10.3 mg/dL   GFR calc non Af Amer >60 >60 mL/min   GFR calc Af Amer >60 >60 mL/min   Anion gap 8 5 - 15   Dg Chest 2 View  12/14/2015  CLINICAL DATA:  Shortness of breath, preoperative examination, history of sleep apnea, current smoker, diabetes. EXAM: CHEST  2 VIEW COMPARISON:  PA and lateral chest x-ray of March 30, 2012 FINDINGS: The lungs are adequately inflated. There is no focal infiltrate. There is no pleural effusion. The heart and pulmonary vascularity are normal. The mediastinum is  normal in width. The bony thorax is unremarkable. IMPRESSION: There is no active cardiopulmonary disease. Electronically Signed   By: David  Martinique M.D.   On: 12/14/2015 11:47    Medications  naproxen (NAPROSYN) tablet 500 mg (500 mg Oral Given 01/10/16 2323)    Dependent edema likely secondary to his body habitus.  Had a doppler for same symptoms.  Lasix is not decreasing leg girth.  Elevate legs and ice them when not on them.  Compression stocking and follow up with your PMD.  Strict return precautions given  I personally performed the services described in this documentation, which was scribed in my presence. The recorded information has been reviewed and is accurate.       Veatrice Kells, MD 01/11/16 [phone]

## 2016-01-11 ENCOUNTER — Encounter (HOSPITAL_BASED_OUTPATIENT_CLINIC_OR_DEPARTMENT_OTHER): Payer: Self-pay | Admitting: Emergency Medicine

## 2016-01-11 MED ORDER — NAPROXEN 375 MG PO TABS: 375.0000 mg | ORAL_TABLET | Freq: Two times a day (BID) | ORAL | Status: DC

## 2016-01-11 NOTE — Discharge Instructions (Signed)
Edema °Edema is an abnormal buildup of fluids in your body tissues. Edema is somewhat dependent on gravity to pull the fluid to the lowest place in your body. That makes the condition more common in the legs and thighs (lower extremities). Painless swelling of the feet and ankles is common and becomes more likely as you get older. It is also common in looser tissues, like around your eyes.  °When the affected area is squeezed, the fluid may move out of that spot and leave a dent for a few moments. This dent is called pitting.  °CAUSES  °There are many possible causes of edema. Eating too much salt and being on your feet or sitting for a long time can cause edema in your legs and ankles. Hot weather may make edema worse. Common medical causes of edema include: °· Heart failure. °· Liver disease. °· Kidney disease. °· Weak blood vessels in your legs. °· Cancer. °· An injury. °· Pregnancy. °· Some medications. °· Obesity.  °SYMPTOMS  °Edema is usually painless. Your skin may look swollen or shiny.  °DIAGNOSIS  °Your health care provider may be able to diagnose edema by asking about your medical history and doing a physical exam. You may need to have tests such as X-rays, an electrocardiogram, or blood tests to check for medical conditions that may cause edema.  °TREATMENT  °Edema treatment depends on the cause. If you have heart, liver, or kidney disease, you need the treatment appropriate for these conditions. General treatment may include: °· Elevation of the affected body part above the level of your heart. °· Compression of the affected body part. Pressure from elastic bandages or support stockings squeezes the tissues and forces fluid back into the blood vessels. This keeps fluid from entering the tissues. °· Restriction of fluid and salt intake. °· Use of a water pill (diuretic). These medications are appropriate only for some types of edema. They pull fluid out of your body and make you urinate more often. This  gets rid of fluid and reduces swelling, but diuretics can have side effects. Only use diuretics as directed by your health care provider. °HOME CARE INSTRUCTIONS  °· Keep the affected body part above the level of your heart when you are lying down.   °· Do not sit still or stand for prolonged periods.   °· Do not put anything directly under your knees when lying down. °· Do not wear constricting clothing or garters on your upper legs.   °· Exercise your legs to work the fluid back into your blood vessels. This may help the swelling go down.   °· Wear elastic bandages or support stockings to reduce ankle swelling as directed by your health care provider.   °· Eat a low-salt diet to reduce fluid if your health care provider recommends it.   °· Only take medicines as directed by your health care provider.  °SEEK MEDICAL CARE IF:  °· Your edema is not responding to treatment. °· You have heart, liver, or kidney disease and notice symptoms of edema. °· You have edema in your legs that does not improve after elevating them.   °· You have sudden and unexplained weight gain. °SEEK IMMEDIATE MEDICAL CARE IF:  °· You develop shortness of breath or chest pain.   °· You cannot breathe when you lie down. °· You develop pain, redness, or warmth in the swollen areas.   °· You have heart, liver, or kidney disease and suddenly get edema. °· You have a fever and your symptoms suddenly get worse. °MAKE SURE YOU:  °·   Understand these instructions. °· Will watch your condition. °· Will get help right away if you are not doing well or get worse. °  °This information is not intended to replace advice given to you by your health care provider. Make sure you discuss any questions you have with your health care provider. °  °Document Released: 08/06/2005 Document Revised: 08/27/2014 Document Reviewed: 05/29/2013 °Elsevier Interactive Patient Education ©2016 Else[employer] Inc[employer] ° °

## 2016-01-26 ENCOUNTER — Encounter (HOSPITAL_BASED_OUTPATIENT_CLINIC_OR_DEPARTMENT_OTHER): Payer: BLUE CROSS/BLUE SHIELD

## 2016-02-10 ENCOUNTER — Emergency Department (HOSPITAL_BASED_OUTPATIENT_CLINIC_OR_DEPARTMENT_OTHER)
Admission: EM | Admit: 2016-02-10 | Discharge: 2016-02-11 | Disposition: A | Discharge status: Other Acute Inpatient Hospital | Payer: BLUE CROSS/BLUE SHIELD | Attending: Emergency Medicine | Admitting: Emergency Medicine

## 2016-02-10 ENCOUNTER — Encounter (HOSPITAL_BASED_OUTPATIENT_CLINIC_OR_DEPARTMENT_OTHER): Payer: Self-pay | Admitting: Emergency Medicine

## 2016-02-10 DIAGNOSIS — Z7984 Long term (current) use of oral hypoglycemic drugs: Secondary | ICD-10-CM | POA: Diagnosis not present

## 2016-02-10 DIAGNOSIS — F1994 Other psychoactive substance use, unspecified with psychoactive substance-induced mood disorder: Secondary | ICD-10-CM

## 2016-02-10 DIAGNOSIS — F1520 Other stimulant dependence, uncomplicated: Secondary | ICD-10-CM | POA: Diagnosis present

## 2016-02-10 DIAGNOSIS — Z79899 Other long term (current) drug therapy: Secondary | ICD-10-CM | POA: Diagnosis not present

## 2016-02-10 DIAGNOSIS — Z87891 Personal history of nicotine dependence: Secondary | ICD-10-CM | POA: Insufficient documentation

## 2016-02-10 DIAGNOSIS — E1165 Type 2 diabetes mellitus with hyperglycemia: Secondary | ICD-10-CM | POA: Insufficient documentation

## 2016-02-10 DIAGNOSIS — I2510 Atherosclerotic heart disease of native coronary artery without angina pectoris: Secondary | ICD-10-CM | POA: Insufficient documentation

## 2016-02-10 DIAGNOSIS — R0602 Shortness of breath: Secondary | ICD-10-CM | POA: Diagnosis not present

## 2016-02-10 DIAGNOSIS — E7800 Pure hypercholesterolemia, unspecified: Secondary | ICD-10-CM | POA: Diagnosis not present

## 2016-02-10 DIAGNOSIS — Y9389 Activity, other specified: Secondary | ICD-10-CM | POA: Diagnosis not present

## 2016-02-10 DIAGNOSIS — I10 Essential (primary) hypertension: Secondary | ICD-10-CM | POA: Diagnosis not present

## 2016-02-10 DIAGNOSIS — Y929 Unspecified place or not applicable: Secondary | ICD-10-CM | POA: Insufficient documentation

## 2016-02-10 DIAGNOSIS — F329 Major depressive disorder, single episode, unspecified: Secondary | ICD-10-CM | POA: Insufficient documentation

## 2016-02-10 DIAGNOSIS — Y998 Other external cause status: Secondary | ICD-10-CM | POA: Insufficient documentation

## 2016-02-10 DIAGNOSIS — N481 Balanitis: Secondary | ICD-10-CM | POA: Diagnosis not present

## 2016-02-10 DIAGNOSIS — T43622A Poisoning by amphetamines, intentional self-harm, initial encounter: Secondary | ICD-10-CM | POA: Diagnosis present

## 2016-02-10 LAB — COMPREHENSIVE METABOLIC PANEL
ALT: 30 U/L (ref 17–63)
AST: 23 U/L (ref 15–41)
Albumin: 3.6 g/dL (ref 3.5–5.0)
Alkaline Phosphatase: 72 U/L (ref 38–126)
Anion gap: 10 (ref 5–15)
BUN: 19 mg/dL (ref 6–20)
CO2: 24 mmol/L (ref 22–32)
Calcium: 10.3 mg/dL (ref 8.9–10.3)
Chloride: 100 mmol/L — ABNORMAL LOW (ref 101–111)
Creatinine, Ser: 0.87 mg/dL (ref 0.61–1.24)
GFR calc Af Amer: >60 mL/min (ref >60)
GFR calc non Af Amer: >60 mL/min (ref >60)
Glucose, Bld: 370 mg/dL — ABNORMAL HIGH (ref 65–99)
Potassium: 4 mmol/L (ref 3.5–5.1)
Sodium: 134 mmol/L — ABNORMAL LOW (ref 135–145)
Total Bilirubin: 0.5 mg/dL (ref 0.3–1.2)
Total Protein: 6.3 g/dL — ABNORMAL LOW (ref 6.5–8.1)

## 2016-02-10 LAB — CBC WITH DIFFERENTIAL/PLATELET
Basophils Absolute: 0.1 10*3/uL (ref 0.0–0.1)
Basophils Relative: 1 %
Eosinophils Absolute: 0.2 10*3/uL (ref 0.0–0.7)
Eosinophils Relative: 4 %
HCT: 45.6 % (ref 39.0–52.0)
Hemoglobin: 15.8 g/dL (ref 13.0–17.0)
Lymphocytes Relative: 33 %
Lymphs Abs: 1.7 10*3/uL (ref 0.7–4.0)
MCH: 29.3 pg (ref 26.0–34.0)
MCHC: 34.6 g/dL (ref 30.0–36.0)
MCV: 84.6 fL (ref 78.0–100.0)
Monocytes Absolute: 0.4 10*3/uL (ref 0.1–1.0)
Monocytes Relative: 8 %
Neutro Abs: 2.8 10*3/uL (ref 1.7–7.7)
Neutrophils Relative %: 54 %
Platelets: 223 10*3/uL (ref 150–400)
RBC: 5.39 MIL/uL (ref 4.22–5.81)
RDW: 12.9 % (ref 11.5–15.5)
WBC: 5.1 10*3/uL (ref 4.0–10.5)

## 2016-02-10 LAB — RAPID URINE DRUG SCREEN, HOSP PERFORMED
Amphetamines: NOT DETECTED
Barbiturates: NOT DETECTED
Benzodiazepines: POSITIVE — AB
Cocaine: NOT DETECTED
Opiates: NOT DETECTED
Tetrahydrocannabinol: NOT DETECTED

## 2016-02-10 LAB — CBG MONITORING, ED
Glucose-Capillary: 309 mg/dL — ABNORMAL HIGH (ref 65–99)
Glucose-Capillary: 228 mg/dL — ABNORMAL HIGH (ref 65–99)
Glucose-Capillary: 220 mg/dL — ABNORMAL HIGH (ref 65–99)

## 2016-02-10 LAB — SALICYLATE LEVEL: Salicylate Lvl: <4 mg/dL (ref 2.8–30.0)

## 2016-02-10 LAB — ETHANOL: Alcohol, Ethyl (B): <5 mg/dL (ref <5)

## 2016-02-10 LAB — CK: Total CK: 22 U/L — ABNORMAL LOW (ref 49–397)

## 2016-02-10 LAB — ACETAMINOPHEN LEVEL: Acetaminophen (Tylenol), Serum: <10 ug/mL — ABNORMAL LOW (ref 10–30)

## 2016-02-10 MED ORDER — METFORMIN HCL 500 MG PO TABS
ORAL_TABLET | ORAL | Status: AC
  Filled 2016-02-10: qty 4

## 2016-02-10 MED ORDER — LORAZEPAM 1 MG PO TABS: 1.0000 mg | ORAL_TABLET | Freq: Three times a day (TID) | ORAL | Status: DC | PRN

## 2016-02-10 MED ORDER — SODIUM CHLORIDE 0.9 % IV BOLUS (SEPSIS)
500.0000 mL | Freq: Once | INTRAVENOUS | Status: AC
  Administered 2016-02-10: 500 mL via INTRAVENOUS

## 2016-02-10 MED ORDER — AMLODIPINE BESYLATE 10 MG PO TABS
10.0000 mg | ORAL_TABLET | Freq: Every day | ORAL | Status: DC
  Administered 2016-02-11: 10 mg via ORAL
  Filled 2016-02-10 (×2): qty 1

## 2016-02-10 MED ORDER — DIPHENHYDRAMINE HCL 25 MG PO CAPS
25.0000 mg | ORAL_CAPSULE | Freq: Once | ORAL | Status: AC
  Administered 2016-02-10: 25 mg via ORAL

## 2016-02-10 MED ORDER — METOPROLOL SUCCINATE ER 100 MG PO TB24
100.0000 mg | ORAL_TABLET | Freq: Every day | ORAL | Status: DC
  Administered 2016-02-10 – 2016-02-11 (×2): 100 mg via ORAL
  Filled 2016-02-10 (×3): qty 1

## 2016-02-10 MED ORDER — FUROSEMIDE 40 MG PO TABS
40.0000 mg | ORAL_TABLET | Freq: Two times a day (BID) | ORAL | Status: DC
  Administered 2016-02-10 – 2016-02-11 (×3): 40 mg via ORAL
  Filled 2016-02-10 (×6): qty 1

## 2016-02-10 MED ORDER — DIPHENHYDRAMINE HCL 25 MG PO CAPS
ORAL_CAPSULE | ORAL | Status: AC
  Administered 2016-02-10: 25 mg via ORAL
  Filled 2016-02-10: qty 1

## 2016-02-10 MED ORDER — METFORMIN HCL 500 MG PO TABS
1000.0000 mg | ORAL_TABLET | Freq: Two times a day (BID) | ORAL | Status: DC
  Administered 2016-02-10 – 2016-02-11 (×3): 1000 mg via ORAL
  Filled 2016-02-10 (×7): qty 2

## 2016-02-10 MED ORDER — LIRAGLUTIDE 18 MG/3ML ~~LOC~~ SOPN: 1.8000 mg | PEN_INJECTOR | Freq: Every day | SUBCUTANEOUS | Status: DC

## 2016-02-10 MED ORDER — GLIPIZIDE 10 MG PO TABS
10.0000 mg | ORAL_TABLET | Freq: Two times a day (BID) | ORAL | Status: DC
  Administered 2016-02-11 (×2): 10 mg via ORAL
  Filled 2016-02-10 (×6): qty 1

## 2016-02-10 MED ORDER — AMLODIPINE BESYLATE 5 MG PO TABS
10.0000 mg | ORAL_TABLET | Freq: Once | ORAL | Status: AC
  Administered 2016-02-10: 10 mg via ORAL
  Filled 2016-02-10: qty 2

## 2016-02-10 MED ORDER — NYSTATIN 100000 UNIT/GM EX POWD
CUTANEOUS | Status: AC
  Filled 2016-02-10: qty 15

## 2016-02-10 MED ORDER — NYSTATIN 100000 UNIT/GM EX POWD
Freq: Two times a day (BID) | CUTANEOUS | Status: DC
  Administered 2016-02-10 – 2016-02-11 (×3): via TOPICAL

## 2016-02-10 MED ORDER — INSULIN ASPART 100 UNIT/ML ~~LOC~~ SOLN
0.0000 [IU] | Freq: Three times a day (TID) | SUBCUTANEOUS | Status: DC
Start: 2016-02-10 — End: 2016-02-11
  Administered 2016-02-10: 5 [IU] via SUBCUTANEOUS
  Administered 2016-02-11: 11 [IU] via SUBCUTANEOUS
  Administered 2016-02-11: 3 [IU] via SUBCUTANEOUS
  Administered 2016-02-11: 5 [IU] via SUBCUTANEOUS
  Filled 2016-02-10 (×2): qty 1

## 2016-02-10 MED ORDER — CLOTRIMAZOLE 1 % EX CREA
TOPICAL_CREAM | Freq: Two times a day (BID) | CUTANEOUS | Status: DC
  Administered 2016-02-11: 10:00:00 via TOPICAL
  Filled 2016-02-10 (×2): qty 15

## 2016-02-10 MED ORDER — PRAVASTATIN SODIUM 10 MG PO TABS
10.0000 mg | ORAL_TABLET | Freq: Every day | ORAL | Status: DC
  Administered 2016-02-11: 10 mg via ORAL
  Filled 2016-02-10 (×3): qty 1

## 2016-02-10 NOTE — ED Provider Notes (Signed)
CSN: [id number]     Arrival date & time 02/10/16  [id number] History   First MD Initiated Contact with Patient 02/10/16 [id number]     Chief Complaint  Patient presents with  . Drug Overdose     (Consider location/radiation/quality/duration/timing/severity/associated sxs/prior Treatment) HPI Comments: Patient presents for suicidal ideations. He states that he's been having worsening depression recently is not taking his antidepressant medications. He states that 2-3 days ago he was having thoughts of wanting to kill himself. He stopped taking his blood pressure medication and snorted a bunch of methamphetamine in an attempt to have a possible heart attack. He denies any other drug use. He currently complains of a little bit of lightheadedness and feels short of breath at times. He denies any chest pain. He denies any alcohol use. He currently is having thoughts of wanting to kill himself again. He denies any increased leg swelling. He denies any fevers or other recent illnesses. He does state he has an bites on his legs. He also states he has some irritation around the tip of his penis.  Patient is a 55 y.o. male presenting with Overdose.  Drug Overdose Associated symptoms include shortness of breath. Pertinent negatives include no chest pain, no abdominal pain and no headaches.    Past Medical History  Diagnosis Date  . Hypertension   . Diabetes mellitus     poorly controlled by his report  . IBS (irritable bowel syndrome)   . History of narcotic addiction ([place])     past history of back pain  . Hypercholesteremia   . Testosterone deficiency   . Depression   . Obesity     Max weight was 390  . CAD (coronary artery disease)  cardiac cath with moderate disease in a septal branch of the ramus intermedius 04/01/2012  . Panic attacks   . OSA on CPAP    Past Surgical History  Procedure Laterality Date  . Cardiac catheterization    . Left heart catheterization with coronary angiogram N/A 03/31/2012     Procedure: LEFT HEART CATHETERIZATION WITH CORONARY ANGIOGRAM;  Surgeon: Leonie Man, MD;  Location: [facility] CATH LAB;  Service: Cardiovascular;  Laterality: N/A;   Family History  Problem Relation Age of Onset  . Diabetes type II Father   . Hypertension Father   . Pancreatic disease Father 46    Deceased  . Healthy Mother   . Healthy Sister   . Healthy Son   . Healthy Daughter    Social History  Substance Use Topics  . Smoking status: Former Smoker    Quit date: 11/21/2015  . Smokeless tobacco: Never Used  . Alcohol Use: No    Review of Systems  Constitutional: Negative for fever, chills, diaphoresis and fatigue.  HENT: Negative for congestion, rhinorrhea and sneezing.   Eyes: Negative.   Respiratory: Positive for shortness of breath. Negative for cough and chest tightness.   Cardiovascular: Negative for chest pain and leg swelling.  Gastrointestinal: Negative for nausea, vomiting, abdominal pain, diarrhea and blood in stool.  Genitourinary: Negative for frequency, hematuria, flank pain and difficulty urinating.  Musculoskeletal: Negative for back pain and arthralgias.  Skin: Positive for rash.  Neurological: Positive for light-headedness. Negative for dizziness, speech difficulty, weakness, numbness and headaches.      Allergies  Review of patient's allergies indicates no known allergies.  Home Medications   Prior to Admission medications   Medication Sig Start Date End Date Taking? Authorizing Provider  amLODipine (NORVASC) 10 MG tablet  Take 1 tablet (10 mg total) by mouth daily. 12/12/15   Rosalita Chessman Chase, DO  furosemide (LASIX) 40 MG tablet Take 1 tablet (40 mg total) by mouth 2 (two) times daily. 11/01/15   Alferd Apa Lowne Chase, DO  glipiZIDE (GLUCOTROL) 10 MG tablet TAKE ONE TABLET BY MOUTH TWICE A DAY BEFORE A MEAL 12/12/15   Alferd Apa Lowne Chase, DO  Liraglutide (VICTOZA) 18 MG/3ML SOPN 1.8 mg sq qd 11/01/15   Alferd Apa Lowne Chase, DO  lisdexamfetamine  (VYVANSE) 20 MG capsule Take 1 capsule (20 mg total) by mouth daily. 12/12/15   Alferd Apa Lowne Chase, DO  LORazepam (ATIVAN) 1 MG tablet Take 1 tablet (1 mg total) by mouth 3 (three) times daily as needed for anxiety. Reported on 12/09/2015 12/12/15   Rosalita Chessman Chase, DO  metFORMIN (GLUCOPHAGE) 1000 MG tablet Take 1 tablet (1,000 mg total) by mouth 2 (two) times daily with a meal. 12/12/15   Rosalita Chessman Chase, DO  metoprolol succinate (TOPROL-XL) 100 MG 24 hr tablet Take 1 tablet (100 mg total) by mouth daily. Take with or immediately following a meal. 12/12/15   Rosalita Chessman Chase, DO  naproxen (NAPROSYN) 375 MG tablet Take 1 tablet (375 mg total) by mouth 2 (two) times daily. 01/11/16   April Palumbo, MD  pravastatin (PRAVACHOL) 10 MG tablet Take 1 tablet (10 mg total) by mouth daily. Reported on 12/09/2015 12/12/15   Alferd Apa Lowne Chase, DO  SYRINGE-NEEDLE, DISP, 3 ML (BD ECLIPSE SYRINGE) 21G X 1" 3 ML MISC Inject Testosterone twice a month as directed 12/19/15   [name], DO  testosterone cypionate (DEPOTESTOSTERONE CYPIONATE) 200 MG/ML injection  10/21/15   Historical Provider, MD  traMADol (ULTRAM) 50 MG tablet Take 2 tablets (100 mg total) by mouth 3 (three) times daily as needed for moderate pain. 12/01/15   Rosalita Chessman Chase, DO  triamcinolone cream (KENALOG) 0.1 % Apply 1 application topically 2 (two) times daily. Patient taking differently: Apply 1 application topically 2 (two) times daily as needed.  09/29/15   Rosalita Chessman Chase, DO  vitamin B-12 (CYANOCOBALAMIN) 1000 MCG tablet Take 1,000 mcg by mouth daily.    Historical Provider, MD   BP 156/105 mmHg  Pulse 74  Temp(Src) 97.5 F (36.4 C) (Oral)  Resp 20  Ht 5\' 11"  (1.803 m)  Wt 330 lb (149.687 kg)  BMI 46.05 kg/m2  SpO2 99% Physical Exam  Constitutional: He is oriented to person, place, and time. He appears well-developed and well-nourished.  HENT:  Head: Normocephalic and atraumatic.  Eyes: Pupils are equal,  round, and reactive to light.  Neck: Normal range of motion. Neck supple.  Cardiovascular: Normal rate, regular rhythm and normal heart sounds.   Pulmonary/Chest: Effort normal and breath sounds normal. No respiratory distress. He has no wheezes. He has no rales. He exhibits no tenderness.  Abdominal: Soft. Bowel sounds are normal. There is no tenderness. There is no rebound and no guarding.  Genitourinary:  Mild erythema around the tip of his penis with yeasty discharge under the foreskin  Musculoskeletal: Normal range of motion. He exhibits edema (mild edema to lower extremities).  Small, noninfected bites to lower extremities  Lymphadenopathy:    He has no cervical adenopathy.  Neurological: He is alert and oriented to person, place, and time.  Skin: Skin is warm and dry. No rash noted.  Psychiatric: He has a normal mood and affect.    ED  Course  Procedures (including critical care time) Labs Review Labs Reviewed  COMPREHENSIVE METABOLIC PANEL - Abnormal; Notable for the following:    Sodium 134 (*)    Chloride 100 (*)    Glucose, Bld 370 (*)    Total Protein 6.3 (*)    All other components within normal limits  ACETAMINOPHEN LEVEL - Abnormal; Notable for the following:    Acetaminophen (Tylenol), Serum <10 (*)    All other components within normal limits  URINE RAPID DRUG SCREEN, HOSP PERFORMED - Abnormal; Notable for the following:    Benzodiazepines POSITIVE (*)    All other components within normal limits  CK - Abnormal; Notable for the following:    Total CK 22 (*)    All other components within normal limits  CBG MONITORING, ED - Abnormal; Notable for the following:    Glucose-Capillary 309 (*)    All other components within normal limits  CBC WITH DIFFERENTIAL/PLATELET  SALICYLATE LEVEL  ETHANOL    Imaging Review No results found. I have personally reviewed and evaluated these images and lab results as part of my medical decision-making.   EKG  Interpretation   Date/Time:  Friday February 10 2016 10:39:42 EDT Ventricular Rate:  65 PR Interval:  164 QRS Duration: 88 QT Interval:  382 QTC Calculation: 397 R Axis:   3 Text Interpretation:  Normal sinus rhythm Normal ECG since last tracing no  significant change Confirmed by Jarel Cuadra  MD, Kacey Dysert ([id number]) on 02/10/2016  10:42:45 AM      MDM   Final diagnoses:  Balanitis  Depression    Patient presents with depression. He states he had an ingestion 2-3 days ago but at this point he is medically clear. His aspirin and salicylate level were negative. His total CK was normal. His blood pressure is elevated but I feel like it's more likely due to noncompliance with his medications. He doesn't have any symptoms that would be more consistent with acute coronary syndrome. His lungs are clear and he has normal oxygen saturations. He has no ischemic changes on EKG. He was assessed by telemetry psych. It was recommended that he be referred for inpatient admission. They are stating that he will not be accepted for inpatient psych placement until his sugar is less than 350 per 24 hours. His initial glucose was 370 however its currently 309 without treatment. I will start him back on his regular medications although we don't have most of the medications here at [place]. I have also ordered him Lotrimin for the balanitis.  13:37 Have tried to transfer pt to [facility] psych ED, but no beds available.  [facility] won't take the patient due to staffing there.  His meds are being sent over by [facility] from [facility]. Also start sliding scale insulin.  Malvin Johns, MD 02/10/16 1339

## 2016-02-10 NOTE — ED Notes (Signed)
Christy from poison control called to check on pt. Update given.

## 2016-02-10 NOTE — ED Notes (Signed)
Staffing office called for sitter. 

## 2016-02-10 NOTE — BH Assessment (Signed)
Per Mickel Baas, NP - patient meets criteria for OBS once he is medically cleared and his CBG is under 350 for 24 hours.

## 2016-02-10 NOTE — BH Assessment (Addendum)
Tele Assessment Note   Patient is a 55 year old white that reports SI with a plan to take enough meth to cause his heart to stop.    Patient reports increased depression associated with his inability to stop using drugs. Patient reports that he has been abusing meth for over a year and he is not able to stop.  Patient denies withdrawal symptoms.  Patient denies a history of seizures.    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   Patient denies HI/Psychosis.  Patient denies physical, sexual or emotional abuse.  Patient denies any upcoming court dates.    Patient reports a previous psychiatric hospitalization at [facility] in 2013.  Patient denies any other treatment or detox.     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 Hypertension   . Diabetes mellitus     poorly controlled by his report  . IBS (irritable bowel syndrome)   . History of narcotic addiction ([place])     past history of back pain  . Hypercholesteremia   . Testosterone deficiency   . Depression   . Obesity     Max weight was 390  . CAD (coronary artery disease)  cardiac cath with moderate disease in a septal branch of the ramus intermedius 04/01/2012  . Panic attacks   . OSA on CPAP     Past Surgical History  Procedure Laterality Date  . Cardiac catheterization    . Left heart catheterization with coronary angiogram N/A 03/31/2012    Procedure: LEFT HEART CATHETERIZATION WITH CORONARY ANGIOGRAM;  Surgeon: Leonie Man, MD;  Location: [facility] CATH LAB;  Service: Cardiovascular;  Laterality: N/A;    Family History:  Family History  Problem Relation Age of Onset  . Diabetes type II Father   . Hypertension Father   . Pancreatic disease Father 76    Deceased  . Healthy Mother   . Healthy Sister   . Healthy Son   .  Healthy Daughter     Social History:  reports that he quit smoking about 2 months ago. He has never used smokeless tobacco. He reports that he does not drink alcohol or use illicit drugs.  Additional Social History:  Alcohol / Drug Use History of alcohol / drug use?: Yes Longest period of sobriety (when/how long): Couple of months Negative Consequences of Use: Financial, Personal relationships, Work / School Substance #1 Name of Substance 1: Meth 1 - Age of First Use: 55 yo 1 - Amount (size/oz): Varies 1 - Frequency: Daily 1 - Duration: 1 year 1 - Last Use / Amount: last night he reports that he cannot remember  CIWA: CIWA-Ar BP: (!) 156/105 mmHg Pulse Rate: 74 COWS:    PATIENT STRENGTHS: (choose at least two) Average or above average intelligence Capable of independent living Communication skills Work skills  Allergies: No Known Allergies  Home Medications:  (Not in a hospital admission)  OB/GYN Status:  No LMP for male patient.  General Assessment Data Location of Assessment:  ([place] ) TTS Assessment: In system Is this a Tele or Face-to-Face Assessment?: Tele Assessment Is this an Initial Assessment or a Re-assessment for this encounter?: Initial Assessment Marital status: Single Maiden name: NA Is patient pregnant?: No Pregnancy Status: No Living Arrangements:  (  Lives with his mother ) Can pt return to current living arrangement?: Yes Admission Status: Voluntary Is patient capable of signing voluntary admission?: Yes Referral Source: Self/Family/Friend Insurance type: BC-BS  Medical Screening Exam ([place]) Medical Exam completed: Yes  Crisis Care Plan Living Arrangements:  (Lives with his mother ) Legal Guardian:  (NA) Name of Psychiatrist: None Reported Name of Therapist: None Reported  Education Status Is patient currently in school?: No Current Grade: NA Highest grade of school patient has completed: NA Name of school:  NA Contact person: NA  Risk to self with the past 6 months Suicidal Ideation: Yes-Currently Present Has patient been a risk to self within the past 6 months prior to admission? : Yes Suicidal Intent: Yes-Currently Present Has patient had any suicidal intent within the past 6 months prior to admission? : Yes Is patient at risk for suicide?: Yes Suicidal Plan?: Yes-Currently Present Has patient had any suicidal plan within the past 6 months prior to admission? : Yes Specify Current Suicidal Plan: Taking enough meth to make his heart stop Access to Means: Yes Specify Access to Suicidal Means: Pt reports that he can buy Meth anywhere What has been your use of drugs/alcohol within the last 12 months?: Meth Previous Attempts/Gestures: Yes How many times?: 1 Other Self Harm Risks: None Reported Triggers for Past Attempts: Unpredictable Intentional Self Injurious Behavior: None Family Suicide History: No Recent stressful life event(s): Other (Comment) (Unable to stop using drugs.) Persecutory voices/beliefs?: No Depression: Yes Depression Symptoms: Despondent, Fatigue, Guilt, Feeling worthless/self pity, Loss of interest in usual pleasures Substance abuse history and/or treatment for substance abuse?: Yes Suicide prevention information given to non-admitted patients: Yes  Risk to Others within the past 6 months Homicidal Ideation: No Does patient have any lifetime risk of violence toward others beyond the six months prior to admission? : No Thoughts of Harm to Others: No Current Homicidal Intent: No Current Homicidal Plan: No Access to Homicidal Means: No Identified Victim: NA History of harm to others?: No Assessment of Violence: None Noted Violent Behavior Description: None Reported Does patient have access to weapons?: No Criminal Charges Pending?: No Does patient have a court date: No Is patient on probation?: No  Psychosis Hallucinations: None noted Delusions: None  noted  Mental Status Report Appearance/Hygiene: Disheveled Eye Contact: Fair Motor Activity: Freedom of movement Speech: Logical/coherent Level of Consciousness: Alert Mood: Depressed Affect: Appropriate to circumstance Anxiety Level: None Thought Processes: Coherent, Relevant Judgement: Unimpaired Orientation: Person, Place, Time, Situation Obsessive Compulsive Thoughts/Behaviors: None  Cognitive Functioning Concentration: Decreased Memory: Recent Intact, Remote Intact IQ: Average Insight: Fair Impulse Control: Poor Appetite: Fair Weight Loss: 0 Weight Gain: 0 Sleep: Decreased Total Hours of Sleep:  (Patient reports that he has been up for 4 days without sleep) Vegetative Symptoms: Decreased grooming  ADLScreening [facility] Assessment Services) Patient's cognitive ability adequate to safely complete daily activities?: No Patient able to express need for assistance with ADLs?: Yes Independently performs ADLs?: Yes (appropriate for developmental age)  Prior Inpatient Therapy Prior Inpatient Therapy: Yes Prior Therapy Dates: 2013 Prior Therapy Facilty/Provider(s): [facility] Reason for Treatment: SI and Substance Abuse  Prior Outpatient Therapy Prior Outpatient Therapy: No Prior Therapy Dates: NA Prior Therapy Facilty/Provider(s): NA Reason for Treatment: NA Does patient have an ACCT team?: No Does patient have Intensive In-House Services?  : No Does patient have [facility] services? : No Does patient have P4CC services?: No  ADL Screening (condition at time of admission) Patient's cognitive ability adequate to  safely complete daily activities?: No Is the patient deaf or have difficulty hearing?: No Does the patient have difficulty seeing, even when wearing glasses/contacts?: No Does the patient have difficulty concentrating, remembering, or making decisions?: No Patient able to express need for assistance with ADLs?: Yes Does the patient have difficulty dressing or  bathing?: No Independently performs ADLs?: Yes (appropriate for developmental age) Does the patient have difficulty walking or climbing stairs?: No Weakness of Legs: None Weakness of Arms/Hands: None  Home Assistive Devices/Equipment Home Assistive Devices/Equipment: None    Abuse/Neglect Assessment (Assessment to be complete while patient is alone) Physical Abuse: Denies Verbal Abuse: Denies Sexual Abuse: Denies Exploitation of patient/patient's resources: Denies Self-Neglect: Denies Values / Beliefs Cultural Requests During Hospitalization: None Spiritual Requests During Hospitalization: None Consults Spiritual Care Consult Needed: No Social Work Consult Needed: No Regulatory affairs officer (For Healthcare) Does patient have an advance directive?: No Would patient like information on creating an advanced directive?: No - patient declined information    Additional Information 1:1 In Past 12 Months?: No CIRT Risk: No Elopement Risk: No Does patient have medical clearance?: Yes     Disposition: Per Mickel Baas, NP - patient meets criteria for OBS once he is medically stable with his CBG. Disposition Initial Assessment Completed for this Encounter: Yes  Graciella Freer [place] 02/10/2016 12:30 PM

## 2016-02-10 NOTE — ED Notes (Signed)
Talked with Christy at [place] control  And recommended EKG, acetaminophen level, complete metabolic, and CPK. Dr Tamera Punt aware.

## 2016-02-10 NOTE — ED Notes (Signed)
TTS to talk with pt.

## 2016-02-10 NOTE — ED Notes (Signed)
Patient states that 2 days ago he to too much methaamphetamine to hurt himself. The feelings have gone away until today when "the thoughts are creeping back in"

## 2016-02-11 ENCOUNTER — Observation Stay (HOSPITAL_COMMUNITY)
Admission: AD | Admit: 2016-02-11 | Discharge: 2016-02-12 | Disposition: A | Discharge status: Home / Self Care | Payer: BLUE CROSS/BLUE SHIELD | Source: Intra-hospital | Attending: Psychiatry | Admitting: Psychiatry

## 2016-02-11 ENCOUNTER — Encounter (HOSPITAL_COMMUNITY): Payer: Self-pay | Admitting: *Deleted

## 2016-02-11 DIAGNOSIS — F329 Major depressive disorder, single episode, unspecified: Secondary | ICD-10-CM | POA: Insufficient documentation

## 2016-02-11 DIAGNOSIS — I10 Essential (primary) hypertension: Secondary | ICD-10-CM | POA: Insufficient documentation

## 2016-02-11 DIAGNOSIS — E7800 Pure hypercholesterolemia, unspecified: Secondary | ICD-10-CM | POA: Insufficient documentation

## 2016-02-11 DIAGNOSIS — F1520 Other stimulant dependence, uncomplicated: Secondary | ICD-10-CM | POA: Diagnosis present

## 2016-02-11 DIAGNOSIS — Z87891 Personal history of nicotine dependence: Secondary | ICD-10-CM | POA: Diagnosis not present

## 2016-02-11 DIAGNOSIS — F909 Attention-deficit hyperactivity disorder, unspecified type: Secondary | ICD-10-CM | POA: Diagnosis not present

## 2016-02-11 DIAGNOSIS — Z794 Long term (current) use of insulin: Secondary | ICD-10-CM | POA: Diagnosis not present

## 2016-02-11 DIAGNOSIS — K580 Irritable bowel syndrome with diarrhea: Secondary | ICD-10-CM | POA: Diagnosis not present

## 2016-02-11 DIAGNOSIS — Z6841 Body Mass Index (BMI) 40.0 and over, adult: Secondary | ICD-10-CM | POA: Diagnosis not present

## 2016-02-11 DIAGNOSIS — F410 Panic disorder [episodic paroxysmal anxiety] without agoraphobia: Secondary | ICD-10-CM | POA: Insufficient documentation

## 2016-02-11 DIAGNOSIS — E1165 Type 2 diabetes mellitus with hyperglycemia: Secondary | ICD-10-CM | POA: Diagnosis not present

## 2016-02-11 DIAGNOSIS — I2510 Atherosclerotic heart disease of native coronary artery without angina pectoris: Secondary | ICD-10-CM | POA: Insufficient documentation

## 2016-02-11 DIAGNOSIS — F1994 Other psychoactive substance use, unspecified with psychoactive substance-induced mood disorder: Secondary | ICD-10-CM | POA: Diagnosis present

## 2016-02-11 DIAGNOSIS — E1151 Type 2 diabetes mellitus with diabetic peripheral angiopathy without gangrene: Secondary | ICD-10-CM

## 2016-02-11 DIAGNOSIS — F1524 Other stimulant dependence with stimulant-induced mood disorder: Principal | ICD-10-CM | POA: Insufficient documentation

## 2016-02-11 DIAGNOSIS — Z79899 Other long term (current) drug therapy: Secondary | ICD-10-CM | POA: Diagnosis not present

## 2016-02-11 DIAGNOSIS — R45851 Suicidal ideations: Secondary | ICD-10-CM | POA: Insufficient documentation

## 2016-02-11 DIAGNOSIS — G4733 Obstructive sleep apnea (adult) (pediatric): Secondary | ICD-10-CM | POA: Insufficient documentation

## 2016-02-11 DIAGNOSIS — Z7984 Long term (current) use of oral hypoglycemic drugs: Secondary | ICD-10-CM | POA: Diagnosis not present

## 2016-02-11 DIAGNOSIS — Z8249 Family history of ischemic heart disease and other diseases of the circulatory system: Secondary | ICD-10-CM | POA: Diagnosis not present

## 2016-02-11 DIAGNOSIS — F419 Anxiety disorder, unspecified: Secondary | ICD-10-CM | POA: Insufficient documentation

## 2016-02-11 DIAGNOSIS — E785 Hyperlipidemia, unspecified: Secondary | ICD-10-CM

## 2016-02-11 DIAGNOSIS — IMO0002 Reserved for concepts with insufficient information to code with codable children: Secondary | ICD-10-CM

## 2016-02-11 DIAGNOSIS — R609 Edema, unspecified: Secondary | ICD-10-CM | POA: Insufficient documentation

## 2016-02-11 DIAGNOSIS — R600 Localized edema: Secondary | ICD-10-CM

## 2016-02-11 LAB — GLUCOSE, CAPILLARY: Glucose-Capillary: 240 mg/dL — ABNORMAL HIGH (ref 65–99)

## 2016-02-11 LAB — CBG MONITORING, ED
Glucose-Capillary: 238 mg/dL — ABNORMAL HIGH (ref 65–99)
Glucose-Capillary: 327 mg/dL — ABNORMAL HIGH (ref 65–99)
Glucose-Capillary: 225 mg/dL — ABNORMAL HIGH (ref 65–99)
Glucose-Capillary: 158 mg/dL — ABNORMAL HIGH (ref 65–99)

## 2016-02-11 MED ORDER — MAGNESIUM HYDROXIDE 400 MG/5ML PO SUSP: 30.0000 mL | Freq: Every day | ORAL | Status: DC | PRN

## 2016-02-11 MED ORDER — METFORMIN HCL 500 MG PO TABS
1000.0000 mg | ORAL_TABLET | Freq: Two times a day (BID) | ORAL | Status: DC
  Administered 2016-02-12: 1000 mg via ORAL
  Filled 2016-02-11: qty 2

## 2016-02-11 MED ORDER — ACETAMINOPHEN 325 MG PO TABS
650.0000 mg | ORAL_TABLET | Freq: Four times a day (QID) | ORAL | Status: DC | PRN
  Administered 2016-02-11: 650 mg via ORAL
  Filled 2016-02-11: qty 2

## 2016-02-11 MED ORDER — FUROSEMIDE 20 MG PO TABS
40.0000 mg | ORAL_TABLET | Freq: Two times a day (BID) | ORAL | Status: DC
  Administered 2016-02-12: 40 mg via ORAL
  Filled 2016-02-11: qty 2

## 2016-02-11 MED ORDER — ACETAMINOPHEN 325 MG PO TABS: 650.0000 mg | ORAL_TABLET | Freq: Four times a day (QID) | ORAL | Status: DC | PRN

## 2016-02-11 MED ORDER — GLIPIZIDE 10 MG PO TABS
10.0000 mg | ORAL_TABLET | Freq: Two times a day (BID) | ORAL | Status: DC
  Administered 2016-02-12: 10 mg via ORAL
  Filled 2016-02-11 (×6): qty 1

## 2016-02-11 MED ORDER — INSULIN ASPART 100 UNIT/ML ~~LOC~~ SOLN
0.0000 [IU] | Freq: Three times a day (TID) | SUBCUTANEOUS | Status: DC
  Administered 2016-02-12: 8 [IU] via SUBCUTANEOUS
  Administered 2016-02-12: 5 [IU] via SUBCUTANEOUS

## 2016-02-11 MED ORDER — AMLODIPINE BESYLATE 5 MG PO TABS
10.0000 mg | ORAL_TABLET | Freq: Every day | ORAL | Status: DC
  Administered 2016-02-12: 10 mg via ORAL
  Filled 2016-02-11: qty 2

## 2016-02-11 MED ORDER — LIRAGLUTIDE 18 MG/3ML ~~LOC~~ SOPN
1.8000 mg | PEN_INJECTOR | Freq: Every day | SUBCUTANEOUS | Status: DC
  Administered 2016-02-12: 1.8 mg via SUBCUTANEOUS

## 2016-02-11 MED ORDER — NYSTATIN 100000 UNIT/GM EX POWD
Freq: Two times a day (BID) | CUTANEOUS | Status: DC
  Administered 2016-02-12: 07:00:00 via TOPICAL
  Filled 2016-02-11: qty 15

## 2016-02-11 MED ORDER — PRAVASTATIN SODIUM 10 MG PO TABS
10.0000 mg | ORAL_TABLET | Freq: Every day | ORAL | Status: DC
  Administered 2016-02-12: 10 mg via ORAL
  Filled 2016-02-11 (×3): qty 1

## 2016-02-11 MED ORDER — METOPROLOL SUCCINATE ER 50 MG PO TB24
100.0000 mg | ORAL_TABLET | Freq: Every day | ORAL | Status: DC
  Administered 2016-02-12: 100 mg via ORAL
  Filled 2016-02-11: qty 2

## 2016-02-11 MED ORDER — ALUM & MAG HYDROXIDE-SIMETH 200-200-20 MG/5ML PO SUSP: 30.0000 mL | ORAL | Status: DC | PRN

## 2016-02-11 MED ORDER — CLOTRIMAZOLE 1 % EX CREA
TOPICAL_CREAM | Freq: Two times a day (BID) | CUTANEOUS | Status: DC
  Administered 2016-02-12: 10:00:00 via TOPICAL
  Filled 2016-02-11 (×2): qty 15

## 2016-02-11 NOTE — ED Notes (Signed)
Pt resting quietly with eyes closed. Sitter remains at bedside. NAD.

## 2016-02-11 NOTE — ED Notes (Signed)
Patient noted in room. No complaints, stable, in no acute distress. Q15 minute rounds and monitoring via security cameras continue for safety. t denies current A/ V H, Anxiety, SI, HI, endorses depression at this time.

## 2016-02-11 NOTE — ED Notes (Signed)
In preparation for transfer to [facility], pt requesting clothing to be retrieved from car. Security accompanied this RN to car and a blue mesh bag of clothing was retrieved from car. Security wanded bag for metal objects. Pt had plastic bag of medication that was left in his car. Car was locked and keyes returned to pt's belongings bag.

## 2016-02-11 NOTE — ED Notes (Signed)
Call to [name] ED Charge Nurse [place] RN ,and [facility] ED Psych unit recieving nurse Bethena Roys RN , Pt accepted and report given. [facility] transport contacted and pt is waiting transport. Fasting CBG this AM 238.

## 2016-02-11 NOTE — ED Notes (Signed)
Pt states that he wants help for methamphetamine addiction. He is having girl friend problems too because she uses meth, and she injects it. He does not approve of this. He said that he actually tried to kill himself by OD, hoping that his heart would stop. The only negative effect was that he was a wake for 3 days.

## 2016-02-11 NOTE — ED Notes (Signed)
Confirmed with Tyler Brewer [facility] that pt can have his CPAP in the obs unit.

## 2016-02-11 NOTE — ED Notes (Signed)
Bed: [facility] Expected date:  Expected time:  Means of arrival:  Comments: Pt from [place]

## 2016-02-11 NOTE — ED Notes (Signed)
Pt report called to [facility] at the [facility] obs unit.

## 2016-02-11 NOTE — ED Notes (Signed)
[place] [facility] called to transport pt to [facility], rn aware

## 2016-02-12 DIAGNOSIS — F1520 Other stimulant dependence, uncomplicated: Secondary | ICD-10-CM

## 2016-02-12 DIAGNOSIS — F1994 Other psychoactive substance use, unspecified with psychoactive substance-induced mood disorder: Secondary | ICD-10-CM | POA: Diagnosis not present

## 2016-02-12 DIAGNOSIS — F1524 Other stimulant dependence with stimulant-induced mood disorder: Secondary | ICD-10-CM | POA: Diagnosis not present

## 2016-02-12 LAB — GLUCOSE, CAPILLARY
Glucose-Capillary: 235 mg/dL — ABNORMAL HIGH (ref 65–99)
Glucose-Capillary: 285 mg/dL — ABNORMAL HIGH (ref 65–99)

## 2016-02-12 MED ORDER — PRAVASTATIN SODIUM 10 MG PO TABS: 10.0000 mg | ORAL_TABLET | Freq: Every day | ORAL | Status: DC

## 2016-02-12 MED ORDER — GLIPIZIDE 10 MG PO TABS: 10.0000 mg | ORAL_TABLET | Freq: Two times a day (BID) | ORAL | Status: DC

## 2016-02-12 MED ORDER — GABAPENTIN 300 MG PO CAPS: 300.0000 mg | ORAL_CAPSULE | Freq: Three times a day (TID) | ORAL | Status: DC

## 2016-02-12 MED ORDER — METOPROLOL SUCCINATE ER 100 MG PO TB24: 100.0000 mg | ORAL_TABLET | Freq: Every day | ORAL | Status: DC

## 2016-02-12 MED ORDER — AMLODIPINE BESYLATE 10 MG PO TABS: 10.0000 mg | ORAL_TABLET | Freq: Every day | ORAL | Status: DC

## 2016-02-12 MED ORDER — FUROSEMIDE 40 MG PO TABS: 40.0000 mg | ORAL_TABLET | Freq: Two times a day (BID) | ORAL | Status: DC

## 2016-02-12 MED ORDER — METFORMIN HCL 1000 MG PO TABS: 1000.0000 mg | ORAL_TABLET | Freq: Two times a day (BID) | ORAL | Status: DC

## 2016-02-12 MED ORDER — GABAPENTIN 300 MG PO CAPS
300.0000 mg | ORAL_CAPSULE | Freq: Three times a day (TID) | ORAL | Status: DC
  Administered 2016-02-12: 300 mg via ORAL
  Filled 2016-02-12: qty 1

## 2016-02-12 MED ORDER — LIRAGLUTIDE 18 MG/3ML ~~LOC~~ SOPN: PEN_INJECTOR | SUBCUTANEOUS | Status: DC

## 2016-02-12 MED ORDER — HYDROXYZINE HCL 25 MG PO TABS: 25.0000 mg | ORAL_TABLET | Freq: Four times a day (QID) | ORAL | Status: DC | PRN

## 2016-02-12 NOTE — Progress Notes (Addendum)
Patient stated that he is here because of girlfriend. She repeatedly keeps calling for him. He reports passive SI, but denies SI or AVH.   Patient was kept safe with constant observation, education, encouragement, and support was given. Medications were administered.   Patient is receptive and compliant. Will continue to monitor.

## 2016-02-12 NOTE — Discharge Summary (Signed)
Physician Discharge Summary Note  Patient:  Tyler Brewer is an 55 y.o., male MRN:  [id number] DOB:  1961-07-20 Patient phone:  [phone] (home)  Patient address:   [address] [place] [address],  Total Time spent with patient: 45 minutes  Date of Admission:  02/11/2016 Date of Discharge: 02/12/2016  Reason for Admission:  Substance abuse with suicidal ideations  Principal Problem: Substance induced mood disorder [facility]) Discharge Diagnoses: Patient Active Problem List   Diagnosis Date Noted  . Methamphetamine use disorder, severe, dependence ([place]) [F15.20] 02/11/2016    Priority: High  . Substance induced mood disorder ([place]) [F19.94] 02/11/2016    Priority: High  . Exertional dyspnea [R06.09] 11/30/2015  . ADD (attention deficit disorder) [F90.9] 09/29/2015  . Binge eating [R63.2] 09/29/2015  . Syncope [R55] 05/05/2012  . Diarrhea [R19.7] 05/05/2012  . Panic attacks [F41.0] 05/05/2012  . OSA (obstructive sleep apnea) [G47.33] 05/05/2012  . HTN (hypertension) [I10] 05/05/2012  . Diabetes mellitus type II [E11.9] 05/05/2012  . Dyslipidemia [E78.5] 05/05/2012  . CAD (coronary artery disease)  cardiac cath with moderate disease in a septal branch of the ramus intermedius [I25.10] 04/01/2012  . Chest pain [R07.9] 04/01/2012  . Drug abuse and dependence ([place]) [F19.20] 04/01/2012  . Family history of coronary artery disease [Z82.49] 03/31/2012  . Sleep apnea, on C-pap [G47.30] 03/31/2012  . Hyperlipemia [E78.5] 03/30/2012  . HTN (hypertension), benign [I10] 03/30/2012  . Morbid obesity ([place]) [E66.01] 03/30/2012  . DM type 2, uncontrolled, with neuropathy ([place]) [E11.40, E11.65] 03/30/2012  . Anxiety disorder [F41.9] 03/30/2012    Past Psychiatric History: substance abuse  Past Medical History:  Past Medical History  Diagnosis Date  . Hypertension   . Diabetes mellitus     poorly controlled by his report  . IBS (irritable bowel syndrome)   . History of  narcotic addiction ([place])     past history of back pain  . Hypercholesteremia   . Testosterone deficiency   . Depression   . Obesity     Max weight was 390  . CAD (coronary artery disease)  cardiac cath with moderate disease in a septal branch of the ramus intermedius 04/01/2012  . Panic attacks   . OSA on CPAP     Past Surgical History  Procedure Laterality Date  . Cardiac catheterization    . Left heart catheterization with coronary angiogram N/A 03/31/2012    Procedure: LEFT HEART CATHETERIZATION WITH CORONARY ANGIOGRAM;  Surgeon: Leonie Man, MD;  Location: [facility] CATH LAB;  Service: Cardiovascular;  Laterality: N/A;   Family History:  Family History  Problem Relation Age of Onset  . Diabetes type II Father   . Hypertension Father   . Pancreatic disease Father 91    Deceased  . Healthy Mother   . Healthy Sister   . Healthy Son   . Healthy Daughter    Family Psychiatric  History: none Social History:  History  Alcohol Use No     History  Drug Use No    Social History   Social History  . Marital Status: Divorced    Spouse Name: N/A  . Number of Children: N/A  . Years of Education: N/A   Social History Main Topics  . Smoking status: Former Smoker    Quit date: 11/21/2015  . Smokeless tobacco: Never Used  . Alcohol Use: No  . Drug Use: No  . Sexual Activity: Not Currently   Other Topics Concern  . None   Social  History Narrative   Lives with mother in a one story home.  Has 2 grown children.     Works as a Barrister's clerk.  Education: Oceanographer.    Hospital Course:   On admission:  55 year old white that reports SI with a plan to take enough meth to cause his heart to stop. Patient reports increased depression associated with his inability to stop using drugs. Patient reports that he has been abusing meth for over a year and he is not able to stop. Patient denies withdrawal symptoms. Patient denies a history of seizures. 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 Patient denies HI/Psychosis. Patient denies physical, sexual or emotional abuse. Patient denies any upcoming court dates. Patient reports a previous psychiatric hospitalization at [facility] in 2013. Patient denies any other treatment or detox.   This morning:  Patient continues to want rehab but wants "a couple of weeks to unplug" and wants inpatient despite not meeting criteria.  Resources for rehab provided along with phone use and encouragement to call.  No suicidal/homicidal ideations, hallucinations, or withdrawal symptoms besides anxiety.  He appears vested in inpatient but discussed the need for the patient to seek assistance and provide input into his care as no treatment will be effective without self input.    Patient has not called or facilitated any housing or rehab but chose to sleep after resources provided this morning.  He has reached maximum benefit of hospitalization and deemed stable for discharge.  Rx were provided for all of his medications including his gabapentin and Vistaril.  Encouraged him to use his resources.  Physical Findings: AIMS:  , ,  ,  ,    CIWA:    COWS:     Musculoskeletal: Strength & Muscle Tone: within normal limits Gait & Station: normal Patient leans: N/A  Psychiatric Specialty Exam: Physical Exam  Constitutional: He is oriented to person, place, and time. He appears well-developed and well-nourished.  HENT:  Head: Normocephalic.  Neck: Normal range of motion.  Respiratory: Effort normal.  Musculoskeletal: Normal range of motion.  Neurological: He is alert and oriented to person, place, and time.  Skin: Skin is warm and dry.  Psychiatric: His speech is normal and behavior is normal. Judgment and thought content normal. His mood appears anxious. Cognition and memory are normal.    Review of Systems  Constitutional: Negative.   HENT:  Negative.   Eyes: Negative.   Respiratory: Negative.   Cardiovascular: Negative.   Gastrointestinal: Negative.   Genitourinary: Negative.   Musculoskeletal: Negative.   Skin: Negative.   Neurological: Negative.   Endo/Heme/Allergies: Negative.   Psychiatric/Behavioral: Positive for substance abuse. The patient is nervous/anxious.     Blood pressure 123/66, pulse 70, temperature 97.5 F (36.4 C), temperature source Oral, resp. rate 20, height 5\' 11"  (1.803 m), weight 142.656 kg (314 lb 8 oz), SpO2 98 %.Body mass index is 43.88 kg/(m^2).  General Appearance: Casual  Eye Contact:  Good  Speech:  Normal Rate  Volume:  Normal  Mood:  Anxious  Affect:  Congruent  Thought Process:  Coherent and Descriptions of Associations: Intact  Orientation:  Full (Time, Place, and Person)  Thought Content:  WDL  Suicidal Thoughts:  No  Homicidal Thoughts:  No  Memory:  Immediate;   Good Recent;   Good Remote;   Good  Judgement:  Fair  Insight:  Fair  Psychomotor Activity:  Normal  Concentration:  Concentration: Good and Attention Span: Good  Recall:  Good  Fund of Knowledge:  Fair  Language:  Good  Akathisia:  No  Handed:  Right  AIMS (if indicated):     Assets:  Housing Leisure Time Physical Health Resilience Social Support  ADL's:  Intact  Cognition:  WNL  Sleep:        Have you used any form of tobacco in the last 30 days? (Cigarettes, Smokeless Tobacco, Cigars, and/or Pipes): Yes  Has this patient used any form of tobacco in the last 30 days? (Cigarettes, Smokeless Tobacco, Cigars, and/or Pipes) Yes, Yes, A prescription for an FDA-approved tobacco cessation medication was offered at discharge and the patient refused  Blood Alcohol level:  Lab Results  Component Value Date   [facility] <5 02/10/2016   ETH <11 [id number]    Metabolic Disorder Labs:  Lab Results  Component Value Date   HGBA1C 8.0* 09/01/2015   MPG 186* 03/30/2012   No results found for: PROLACTIN Lab Results   Component Value Date   CHOL 123 09/01/2015   TRIG 136.0 09/01/2015   HDL 34.00* 09/01/2015   CHOLHDL 4 09/01/2015   VLDL 27.2 09/01/2015   [place] 62 09/01/2015    See Psychiatric Specialty Exam and Suicide Risk Assessment completed by Attending Physician prior to discharge.  Discharge destination:  Home  Is patient on multiple antipsychotic therapies at discharge:  No   Has Patient had three or more failed trials of antipsychotic monotherapy by history:  No  Recommended Plan for Multiple Antipsychotic Therapies: NA     Medication List    ASK your doctor about these medications      Indication   amLODipine 10 MG tablet  Commonly known as:  NORVASC  Take 1 tablet (10 mg total) by mouth daily.      furosemide 40 MG tablet  Commonly known as:  LASIX  Take 1 tablet (40 mg total) by mouth 2 (two) times daily.   Indication:  Edema     glipiZIDE 10 MG tablet  Commonly known as:  GLUCOTROL  TAKE ONE TABLET BY MOUTH TWICE A DAY BEFORE A MEAL      Liraglutide 18 MG/3ML Sopn  Commonly known as:  VICTOZA  1.8 mg sq qd   Indication:  Type 2 Diabetes     metFORMIN 1000 MG tablet  Commonly known as:  GLUCOPHAGE  Take 1 tablet (1,000 mg total) by mouth 2 (two) times daily with a meal.      metoprolol succinate 100 MG 24 hr tablet  Commonly known as:  TOPROL-XL  Take 1 tablet (100 mg total) by mouth daily. Take with or immediately following a meal.      naproxen 375 MG tablet  Commonly known as:  NAPROSYN  Take 1 tablet (375 mg total) by mouth 2 (two) times daily.      pravastatin 10 MG tablet  Commonly known as:  PRAVACHOL  Take 1 tablet (10 mg total) by mouth daily. Reported on 12/09/2015      SYRINGE-NEEDLE (DISP) 3 ML 21G X 1" 3 ML Misc  Commonly known as:  BD ECLIPSE SYRINGE  Inject Testosterone twice a month as directed      testosterone cypionate 200 MG/ML injection  Commonly known as:  DEPOTESTOSTERONE CYPIONATE  Inject 200 mg into the muscle every 14  (fourteen) days.      triamcinolone cream 0.1 %  Commonly known  as:  KENALOG  Apply 1 application topically 2 (two) times daily.      vitamin B-12 1000 MCG tablet  Commonly known as:  CYANOCOBALAMIN  Take 1,000 mcg by mouth daily.          Follow-up recommendations:  Activity:  as tolerated Diet:  heart healthy diet  Comments:  Patient provided with Rx and resources for homelessness and rehab, encouraged to use.  SignedWaylan Boga, NP 02/12/2016, 11:32 AM  Reviewed the information documented and agree with the treatment plan.  Anthony Ducre 02/12/2016 2:22 PM

## 2016-02-12 NOTE — H&P (Signed)
Psychiatric Admission Assessment Adult  Patient Identification: Tyler Brewer MRN:  [id number] Date of Evaluation:  02/12/2016 Chief Complaint:  MDD AMPHETAMINE USE DISORDER Principal Diagnosis: Substance induced mood disorder ([place]) Diagnosis:   Patient Active Problem List   Diagnosis Date Noted  . Methamphetamine use disorder, severe, dependence ([place]) [F15.20] 02/11/2016    Priority: High  . Substance induced mood disorder ([place]) [F19.94] 02/11/2016    Priority: High  . Exertional dyspnea [R06.09] 11/30/2015  . ADD (attention deficit disorder) [F90.9] 09/29/2015  . Binge eating [R63.2] 09/29/2015  . Syncope [R55] 05/05/2012  . Diarrhea [R19.7] 05/05/2012  . Panic attacks [F41.0] 05/05/2012  . OSA (obstructive sleep apnea) [G47.33] 05/05/2012  . HTN (hypertension) [I10] 05/05/2012  . Diabetes mellitus type II [E11.9] 05/05/2012  . Dyslipidemia [E78.5] 05/05/2012  . CAD (coronary artery disease)  cardiac cath with moderate disease in a septal branch of the ramus intermedius [I25.10] 04/01/2012  . Chest pain [R07.9] 04/01/2012  . Drug abuse and dependence ([place]) [F19.20] 04/01/2012  . Family history of coronary artery disease [Z82.49] 03/31/2012  . Sleep apnea, on C-pap [G47.30] 03/31/2012  . Hyperlipemia [E78.5] 03/30/2012  . HTN (hypertension), benign [I10] 03/30/2012  . Morbid obesity ([place]) [E66.01] 03/30/2012  . DM type 2, uncontrolled, with neuropathy ([place]) [E11.40, E11.65] 03/30/2012  . Anxiety disorder [F41.9] 03/30/2012   History of Present Illness: On admission:  55 year old white that reports SI with a plan to take enough meth to cause his heart to stop. Patient reports increased depression associated with his inability to stop using drugs. Patient reports that he has been abusing meth for over a year and he is not able to stop. Patient denies withdrawal symptoms. Patient denies a history of seizures. 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 Patient denies HI/Psychosis. Patient denies physical, sexual or emotional abuse. Patient denies any upcoming court dates. Patient reports a previous psychiatric hospitalization at [facility] in 2013. Patient denies any other treatment or detox.   Today:  Patient continues to want rehab but wants "a couple of weeks to unplug" and wants inpatient despite not meeting criteria.  Resources for rehab provided along with phone use and encouragement to call.  No suicidal/homicidal ideations, hallucinations, or withdrawal symptoms besides anxiety.  He appears vested in inpatient but discussed the need for the patient to seek assistance and provide input into his care as no treatment will be effective without self input.   Associated Signs/Symptoms: Depression Symptoms:  depressed mood, (Hypo) Manic Symptoms:  none Anxiety Symptoms:  anxiety Psychotic Symptoms:  none PTSD Symptoms: Negative Total Time spent with patient: 45 minutes  Past Psychiatric History: substance abuse, anxiety, depression, ADHD  Is the patient at risk to self? No.  Has the patient been a risk to self in the past 6 months? No.  Has the patient been a risk to self within the distant past? No.  Is the patient a risk to others? No.  Has the patient been a risk to others in the past 6 months? No.  Has the patient been a risk to others within the distant past? No.   Prior Inpatient Therapy:  [facility] Prior Outpatient Therapy:  Yes but not recent  Alcohol Screening: Patient refused Alcohol Screening Tool: Yes 1. How often do you have a drink containing alcohol?: Never 9. Have you or someone else been injured as  a result of your drinking?: No 10. Has a relative or friend or a doctor or another health worker been concerned about your drinking or suggested you cut down?: No Alcohol Use Disorder Identification Test Final Score (AUDIT): 0 Substance  Abuse History in the last 12 months:  Yes.   Consequences of Substance Abuse: job loss Previous Psychotropic Medications: No  Psychological Evaluations: Yes  Past Medical History:  Past Medical History  Diagnosis Date  . Hypertension   . Diabetes mellitus     poorly controlled by his report  . IBS (irritable bowel syndrome)   . History of narcotic addiction ([place])     past history of back pain  . Hypercholesteremia   . Testosterone deficiency   . Depression   . Obesity     Max weight was 390  . CAD (coronary artery disease)  cardiac cath with moderate disease in a septal branch of the ramus intermedius 04/01/2012  . Panic attacks   . OSA on CPAP     Past Surgical History  Procedure Laterality Date  . Cardiac catheterization    . Left heart catheterization with coronary angiogram N/A 03/31/2012    Procedure: LEFT HEART CATHETERIZATION WITH CORONARY ANGIOGRAM;  Surgeon: Leonie Man, MD;  Location: [facility] CATH LAB;  Service: Cardiovascular;  Laterality: N/A;   Family History:  Family History  Problem Relation Age of Onset  . Diabetes type II Father   . Hypertension Father   . Pancreatic disease Father 53    Deceased  . Healthy Mother   . Healthy Sister   . Healthy Son   . Healthy Daughter    Family Psychiatric  History: none Tobacco Screening: @FLOW ([phone])::1)@ Social History:  History  Alcohol Use No     History  Drug Use No    Additional Social History:                           Allergies:  No Known Allergies Lab Results:  Results for orders placed or performed during the hospital encounter of 02/11/16 (from the past 48 hour(s))  Glucose, capillary     Status: Abnormal   Collection Time: 02/11/16  9:32 PM  Result Value Ref Range   Glucose-Capillary 240 (H) 65 - 99 mg/dL   Comment 1 Notify RN   Glucose, capillary     Status: Abnormal   Collection Time: 02/12/16  6:48 AM  Result Value Ref Range   Glucose-Capillary 235 (H) 65 - 99 mg/dL    Comment 1 Notify RN     Blood Alcohol level:  Lab Results  Component Value Date   ETH <5 02/10/2016   ETH <11 [id number]    Metabolic Disorder Labs:  Lab Results  Component Value Date   HGBA1C 8.0* 09/01/2015   MPG 186* 03/30/2012   No results found for: PROLACTIN Lab Results  Component Value Date   CHOL 123 09/01/2015   TRIG 136.0 09/01/2015   HDL 34.00* 09/01/2015   CHOLHDL 4 09/01/2015   VLDL 27.2 09/01/2015   LDLCALC 62 09/01/2015    Current Medications: Current Facility-Administered Medications  Medication Dose Route Frequency Provider Last Rate Last Dose  . acetaminophen (TYLENOL) tablet 650 mg  650 mg Oral Q6H PRN Patrecia Pour, NP      . alum & mag hydroxide-simeth (MAALOX/MYLANTA) 200-200-20 MG/5ML suspension 30 mL  30 mL Oral Q4H PRN Patrecia Pour, NP      . amLODipine ([place])  tablet 10 mg  10 mg Oral Daily Patrecia Pour, NP   10 mg at 02/12/16 [id number]  . clotrimazole (LOTRIMIN) 1 % cream   Topical BID Patrecia Pour, NP      . furosemide (LASIX) tablet 40 mg  40 mg Oral BID Patrecia Pour, NP   40 mg at 02/12/16 [id number]  . glipiZIDE (GLUCOTROL) tablet 10 mg  10 mg Oral BID AC Patrecia Pour, NP   10 mg at 02/12/16 0926  . insulin aspart (novoLOG) injection 0-15 Units  0-15 Units Subcutaneous TID WC Patrecia Pour, NP   5 Units at 02/12/16 0700  . Liraglutide SOPN 1.8 mg  1.8 mg Subcutaneous Daily Patrecia Pour, NP   1.8 mg at 02/12/16 0935  . magnesium hydroxide (MILK OF MAGNESIA) suspension 30 mL  30 mL Oral Daily PRN Patrecia Pour, NP      . metFORMIN (GLUCOPHAGE) tablet 1,000 mg  1,000 mg Oral BID WC Patrecia Pour, NP   1,000 mg at 02/12/16 [id number]  . metoprolol succinate (TOPROL-XL) 24 hr tablet 100 mg  100 mg Oral Daily Patrecia Pour, NP   100 mg at 02/12/16 [id number]  . nystatin (MYCOSTATIN/NYSTOP) topical powder   Topical BID Patrecia Pour, NP      . pravastatin (PRAVACHOL) tablet 10 mg  10 mg Oral Daily Patrecia Pour, NP   10 mg at 02/12/16 [id number]   PTA  Medications: Prescriptions prior to admission  Medication Sig Dispense Refill Last Dose  . amLODipine (NORVASC) 10 MG tablet Take 1 tablet (10 mg total) by mouth daily.  3 02/11/2016 at Unknown time  . furosemide (LASIX) 40 MG tablet Take 1 tablet (40 mg total) by mouth 2 (two) times daily. 60 tablet 3 02/11/2016 at Unknown time  . glipiZIDE (GLUCOTROL) 10 MG tablet TAKE ONE TABLET BY MOUTH TWICE A DAY BEFORE A MEAL (Patient taking differently: Take 10 mg by mouth 2 (two) times daily before a meal. ) 60 tablet 2 02/11/2016 at Unknown time  . Liraglutide (VICTOZA) 18 MG/3ML SOPN 1.8 mg sq qd (Patient taking differently: Inject 1.8 mg into the skin daily. ) 6 mL 3 2 weeks  . metFORMIN (GLUCOPHAGE) 1000 MG tablet Take 1 tablet (1,000 mg total) by mouth 2 (two) times daily with a meal.   02/11/2016 at Unknown time  . metoprolol succinate (TOPROL-XL) 100 MG 24 hr tablet Take 1 tablet (100 mg total) by mouth daily. Take with or immediately following a meal. 90 tablet 3 02/11/2016 at 0900  . naproxen (NAPROSYN) 375 MG tablet Take 1 tablet (375 mg total) by mouth 2 (two) times daily. (Patient taking differently: Take 375 mg by mouth 2 (two) times daily as needed for mild pain or moderate pain. ) 20 tablet 0 Past Week at Unknown time  . pravastatin (PRAVACHOL) 10 MG tablet Take 1 tablet (10 mg total) by mouth daily. Reported on 12/09/2015   02/11/2016 at Unknown time  . SYRINGE-NEEDLE, DISP, 3 ML (BD ECLIPSE SYRINGE) 21G X 1" 3 ML MISC Inject Testosterone twice a month as directed 24 each 1   . testosterone cypionate (DEPOTESTOSTERONE CYPIONATE) 200 MG/ML injection Inject 200 mg into the muscle every 14 (fourteen) days.   4 May  . triamcinolone cream (KENALOG) 0.1 % Apply 1 application topically 2 (two) times daily. (Patient not taking: Reported on 02/11/2016) 30 g 0 Not Taking at Unknown time  . vitamin B-12 (CYANOCOBALAMIN) 1000 MCG tablet Take 1,000  mcg by mouth daily.   Past Week at Unknown time     Musculoskeletal: Strength & Muscle Tone: within normal limits Gait & Station: normal Patient leans: N/A  Psychiatric Specialty Exam: Physical Exam  Constitutional: He is oriented to person, place, and time. He appears well-developed and well-nourished.  HENT:  Head: Normocephalic.  Neck: Normal range of motion.  Respiratory: Effort normal.  Musculoskeletal: Normal range of motion.  Neurological: He is alert and oriented to person, place, and time.  Skin: Skin is warm and dry.  Psychiatric: His speech is normal and behavior is normal. Judgment and thought content normal. His mood appears anxious. Cognition and memory are normal. He exhibits a depressed mood.    Review of Systems  Constitutional: Negative.   HENT: Negative.   Eyes: Negative.   Respiratory: Negative.   Cardiovascular: Negative.   Gastrointestinal: Negative.   Genitourinary: Negative.   Musculoskeletal: Negative.   Skin: Negative.   Neurological: Negative.   Endo/Heme/Allergies: Negative.   Psychiatric/Behavioral: Positive for depression and substance abuse. The patient is nervous/anxious.     Blood pressure 123/66, pulse 70, temperature 97.5 F (36.4 C), temperature source Oral, resp. rate 20, height 5\' 11"  (1.803 m), weight 142.656 kg (314 lb 8 oz), SpO2 98 %.Body mass index is 43.88 kg/(m^2).  General Appearance: Casual  Eye Contact:  Good  Speech:  Normal Rate  Volume:  Normal  Mood:  Anxious and Depressed  Affect:  Congruent  Thought Process:  Coherent and Descriptions of Associations: Intact  Orientation:  Full (Time, Place, and Person)  Thought Content:  WDL  Suicidal Thoughts:  No  Homicidal Thoughts:  No  Memory:  Immediate;   Good Recent;   Good Remote;   Good  Judgement:  Fair  Insight:  Fair  Psychomotor Activity:  Normal  Concentration:  Concentration: Good and Attention Span: Good  Recall:  Good  Fund of Knowledge:  Good  Language:  Good  Akathisia:  No  Handed:  Right  AIMS (if  indicated):     Assets:  Housing Leisure Time Physical Health Resilience Social Support  ADL's:  Intact  Cognition:  WNL  Sleep:          Treatment Plan Summary: Daily contact with patient to assess and evaluate symptoms and progress in treatment, Medication management and Plan substance induced mood disorder:   -Crisis stabilization -Medication management:  Medical medications restarted along with gabapentin 300 mg TID for withdrawal symptoms, Vistaril 25 mg every six hours PRN anxiety. -Individual and substance abuse counseling  Observation Level/Precautions:  15 minute checks  Laboratory:  completed in ED, reviewed, stable  Psychotherapy:  Individual therapy  Medications:    Consultations:  none  Discharge Concerns:  rehab  Estimated LOS:  23 hours or less  Other:     I certify that inpatient services furnished can reasonably be expected to improve the patient's condition.    Waylan Boga, NP 6/25/201710:36 AM  Reviewed the information documented and agree with the treatment plan.  Dayshon Roback 02/12/2016 2:22 PM

## 2016-02-12 NOTE — Progress Notes (Signed)
Patient given belongings, discharge sheets, and medication script. D/c'd at 1344.

## 2016-02-12 NOTE — [facility] Suicide Risk Assessment (Signed)
Suicide Risk Assessment  Discharge Assessment   [facility] Discharge Suicide Risk Assessment   Principal Problem: Substance induced mood disorder [facility]) Discharge Diagnoses:  Patient Active Problem List   Diagnosis Date Noted  . Methamphetamine use disorder, severe, dependence ([place]) [F15.20] 02/11/2016    Priority: High  . Substance induced mood disorder ([place]) [F19.94] 02/11/2016    Priority: High  . Exertional dyspnea [R06.09] 11/30/2015  . ADD (attention deficit disorder) [F90.9] 09/29/2015  . Binge eating [R63.2] 09/29/2015  . Syncope [R55] 05/05/2012  . Diarrhea [R19.7] 05/05/2012  . Panic attacks [F41.0] 05/05/2012  . OSA (obstructive sleep apnea) [G47.33] 05/05/2012  . HTN (hypertension) [I10] 05/05/2012  . Diabetes mellitus type II [E11.9] 05/05/2012  . Dyslipidemia [E78.5] 05/05/2012  . CAD (coronary artery disease)  cardiac cath with moderate disease in a septal branch of the ramus intermedius [I25.10] 04/01/2012  . Chest pain [R07.9] 04/01/2012  . Drug abuse and dependence ([place]) [F19.20] 04/01/2012  . Family history of coronary artery disease [Z82.49] 03/31/2012  . Sleep apnea, on C-pap [G47.30] 03/31/2012  . Hyperlipemia [E78.5] 03/30/2012  . HTN (hypertension), benign [I10] 03/30/2012  . Morbid obesity ([place]) [E66.01] 03/30/2012  . DM type 2, uncontrolled, with neuropathy ([place]) [E11.40, E11.65] 03/30/2012  . Anxiety disorder [F41.9] 03/30/2012    Total Time spent with patient: 45 minutes  Musculoskeletal: Strength & Muscle Tone: within normal limits Gait & Station: normal Patient leans: N/A  Psychiatric Specialty Exam: Physical Exam  Constitutional: He is oriented to person, place, and time. He appears well-developed and well-nourished.  HENT:  Head: Normocephalic.  Neck: Normal range of motion.  Respiratory: Effort normal.  Musculoskeletal: Normal range of motion.  Neurological: He is alert and oriented to person, place, and time.  Skin: Skin is warm and dry.   Psychiatric: His speech is normal and behavior is normal. Judgment and thought content normal. His mood appears anxious. Cognition and memory are normal.    Review of Systems  Constitutional: Negative.   HENT: Negative.   Eyes: Negative.   Respiratory: Negative.   Cardiovascular: Negative.   Gastrointestinal: Negative.   Genitourinary: Negative.   Musculoskeletal: Negative.   Skin: Negative.   Neurological: Negative.   Endo/Heme/Allergies: Negative.   Psychiatric/Behavioral: Positive for substance abuse. The patient is nervous/anxious.     Blood pressure 123/66, pulse 70, temperature 97.5 F (36.4 C), temperature source Oral, resp. rate 20, height 5\' 11"  (1.803 m), weight 142.656 kg (314 lb 8 oz), SpO2 98 %.Body mass index is 43.88 kg/(m^2).  General Appearance: Casual  Eye Contact:  Good  Speech:  Normal Rate  Volume:  Normal  Mood:  Anxious  Affect:  Congruent  Thought Process:  Coherent and Descriptions of Associations: Intact  Orientation:  Full (Time, Place, and Person)  Thought Content:  WDL  Suicidal Thoughts:  No  Homicidal Thoughts:  No  Memory:  Immediate;   Good Recent;   Good Remote;   Good  Judgement:  Fair  Insight:  Fair  Psychomotor Activity:  Normal  Concentration:  Concentration: Good and Attention Span: Good  Recall:  Good  Fund of Knowledge:  Fair  Language:  Good  Akathisia:  No  Handed:  Right  AIMS (if indicated):     Assets:  Housing Leisure Time Physical Health Resilience Social Support  ADL's:  Intact  Cognition:  WNL  Sleep:       Mental Status Per Nursing Assessment::   On Admission:   substance abuse with suicidal ideations  Demographic  Factors:  Male and Caucasian  Loss Factors: NA  Historical Factors: NA  Risk Reduction Factors:   Sense of responsibility to family, Living with another person, especially a relative and Positive social support  Continued Clinical Symptoms:  Anxiety, mild  Cognitive Features That  Contribute To Risk:  None    Suicide Risk:  Minimal: No identifiable suicidal ideation.  Patients presenting with no risk factors but with morbid ruminations; may be classified as minimal risk based on the severity of the depressive symptoms    Plan Of Care/Follow-up recommendations:  Activity:  as tolerated Diet:  heart healthy diet  Hutch Rhett, NP 02/12/2016, 11:37 AM

## 2016-02-12 NOTE — Progress Notes (Signed)
Admission Note:  D: Patient is 55 year old male brought to [facility] from [facility] as a result of depression and suicide ideation with a plan to overdose on drug. On admission, patient presents with flat affect and depressed mood. Patient brightened up during the admission process. Patient stated that his problem is his wife. Patient reports that he is having problem with the wife because "she injects meth and do not know her whereabout now because she cannot pick her phone. Patient reports using large amount of meth in suicide attempt but only make him to stay awake for 3 days". Denies pain, HI/VH at this time. Still endorses SI but contracted for safety verbally. Patient reports staying with his mom but can be homeless as he cannot guarantee his stay with his mom. Patient has no job. Patient was cooperative with admission process.  A: Skin was assessed, no contraband found. Skin intact. POC and unit policies explained and understanding verbalized. Consents obtained. Accepted food and fluids offered.   R: Patient had no additional questions or concerns.

## 2016-02-15 ENCOUNTER — Encounter: Payer: Self-pay | Admitting: Family Medicine

## 2016-02-15 NOTE — Telephone Encounter (Signed)
Please advise, ativan is not on pt's current medication list. Pt was hospitalized 02/11/16-02/12/16 at [facility] for substance induced mood disorder w/ SI.

## 2016-02-15 NOTE — Telephone Encounter (Signed)
Due to recent [facility] hospitalization I am very uncomfortable writing the ativan---- he definitely needs psych f/u --- has he made a f/u appointment with psych?

## 2016-02-16 NOTE — Telephone Encounter (Signed)
He will need to make an appointment with [facility]-- psych---- we can leave a list up front for him  The hosp did not d/c him on the med -- so that usually means they don't want him taking it

## 2016-02-16 NOTE — Telephone Encounter (Signed)
Dr.Lowne please advise based on the  patient's response. Thanks   KP

## 2016-02-27 ENCOUNTER — Encounter: Payer: Self-pay | Admitting: Family Medicine

## 2016-02-29 ENCOUNTER — Ambulatory Visit (INDEPENDENT_AMBULATORY_CARE_PROVIDER_SITE_OTHER): Payer: BLUE CROSS/BLUE SHIELD | Admitting: Pulmonary Disease

## 2016-02-29 ENCOUNTER — Encounter: Payer: Self-pay | Admitting: Pulmonary Disease

## 2016-02-29 VITALS — BP 142/94 | HR 66 | Ht 71.0 in | Wt 324.0 lb

## 2016-02-29 DIAGNOSIS — G4733 Obstructive sleep apnea (adult) (pediatric): Secondary | ICD-10-CM

## 2016-02-29 NOTE — Patient Instructions (Signed)
  It was a pleasure taking care of you today!  Continue using your CPAP machine.   Please make sure you use your CPAP device everytime you sleep.  We will monitor the usage of your machine per your insurance requirement.  Your insurance company may take the machine from you if you are not using it regularly.   Please clean the mask, tubings, filter, water reservoir with soapy water every week.  Please use distilled water for the water reservoir.   Please call the office or your machine provider (DME company) if you are having issues with the device.   Return to clinic in 1 year    

## 2016-02-29 NOTE — Progress Notes (Signed)
Subjective:    Patient ID: Tyler Brewer, male    DOB: 27-Jan-1961, 55 y.o.   MRN: [id number]  HPI   This is the case of Tyler Brewer, 55 y.o. Male, who was referred by Dr. Lyndal Pulley  in consultation regarding his OSA  As you very well know, patient was dxed with OSA in 2204 in [place], [place]. Unsure severity. Was on CPAP 14. Felt better using it.   He felt better with CPAP. More energy. Less sleepiness.  Has not had a cpap x 1 yr > symptomatic now. Has hypersomnia, snoring, fatigue. Hypersomnia affects fxnality.   He has lost 60 lbs since 04.   ESS 16.    ROV 02/29/16 Patient returns to the office as follow-up on his sleep apnea. Since last seen, he had a home sleep study which showed AHI of 30. Uses auto CPAP. Feels better using it. More energy. Less sleepiness. Feels benefit of CPAP.  DL for the last month > 50%, AHI 1.2   Review of Systems  Constitutional: Negative.  Negative for fever and unexpected weight change.  HENT: Positive for congestion, postnasal drip and sinus pressure. Negative for dental problem, ear pain, nosebleeds, rhinorrhea, sneezing, sore throat and trouble swallowing.   Eyes: Positive for redness and itching.  Respiratory: Positive for shortness of breath and wheezing. Negative for cough and chest tightness.   Cardiovascular: Positive for leg swelling. Negative for palpitations.  Gastrointestinal: Negative.  Negative for nausea and vomiting.  Endocrine: Negative.   Genitourinary: Negative.  Negative for dysuria.  Musculoskeletal: Negative for joint swelling.  Skin: Positive for rash.  Allergic/Immunologic: Positive for environmental allergies.  Neurological: Negative.  Negative for headaches.  Hematological: Negative.  Does not bruise/bleed easily.  Psychiatric/Behavioral: Negative.  Negative for dysphoric mood. The patient is not nervous/anxious.       Objective:   Physical Exam  Vitals:  Filed Vitals:   02/29/16 0857  BP: 142/94    Pulse: 66  Height: 5\' 11"  (1.803 m)  Weight: 324 lb (146.965 kg)  SpO2: 98%    Constitutional/General:  Pleasant, well-nourished, well-developed, not in any distress,  Comfortably seating.  Well kempt  Body mass index is 45.21 kg/(m^2). Wt Readings from Last 3 Encounters:  02/29/16 324 lb (146.965 kg)  02/11/16 314 lb 8 oz (142.656 kg)  02/10/16 330 lb (149.687 kg)    Neck circumference: 19.5 in  HEENT: Pupils equal and reactive to light and accommodation. Anicteric sclerae. Normal nasal mucosa.   No oral  lesions,  mouth clear,  oropharynx clear, no postnasal drip. (-) Oral thrush. No dental caries.  Airway - Mallampati class III  Neck: No masses. Midline trachea. No JVD, (-) LAD. (-) bruits appreciated.  Respiratory/Chest: Grossly normal chest. (-) deformity. (-) Accessory muscle use.  Symmetric expansion. (-) Tenderness on palpation.  Resonant on percussion.  Diminished BS on both lower lung zones. (-) wheezing, crackles, rhonchi (-) egophony  Cardiovascular: Regular rate and  rhythm, heart sounds normal, no murmur or gallops, no peripheral edema  Gastrointestinal:  Normal bowel sounds. Soft, non-tender. No hepatosplenomegaly.  (-) masses.   Musculoskeletal:  Normal muscle tone. Normal gait.   Extremities: Grossly normal. (-) clubbing, cyanosis.  (-) edema  Skin: (-) rash,lesions seen.   Neurological/Psychiatric : alert, oriented to time, place, person. Normal mood and affect            Assessment & Plan:  OSA (obstructive sleep apnea) Pt with severe OSA.  HST 12/2015  AHI 30. On autocpap 5-15 cm water.  Feels better using it. More energy. Less sleepiness. Feels benefit of cpap. DL for last month > 50%, AHI 1.2   Plan :  We extensively discussed the importance of treating OSA and the need to use PAP therapy.   Continue with autocpap 5-15 cm water. No issues with it. Feels benefit of cpap.    Patient was instructed to have mask, tubings,  filter, reservoir cleaned at least once a week with soapy water.  Patient was instructed to call the office if he/she is having issues with the PAP device.    I advised patient to obtain sufficient amount of sleep --  7 to 8 hours at least in a 24 hr period.  Patient was advised to follow good sleep hygiene.  Patient was advised NOT to engage in activities requiring concentration and/or vigilance if he/she is and  sleepy.  Patient is NOT to drive if he/she is sleepy.     Morbid obesity ([place]) Weight reduction.       Patient will follow up with me in 1 year.     Monica Becton, MD 02/29/2016   9:19 AM [facility] Pager: [phone] Office: [phone], Fax: [phone]

## 2016-02-29 NOTE — Assessment & Plan Note (Signed)
Weight reduction 

## 2016-02-29 NOTE — Assessment & Plan Note (Signed)
Pt with severe OSA.  HST 12/2015  AHI 30. On autocpap 5-15 cm water.  Feels better using it. More energy. Less sleepiness. Feels benefit of cpap. DL for last month > 50%, AHI 1.2   Plan :  We extensively discussed the importance of treating OSA and the need to use PAP therapy.   Continue with autocpap 5-15 cm water. No issues with it. Feels benefit of cpap.    Patient was instructed to have mask, tubings, filter, reservoir cleaned at least once a week with soapy water.  Patient was instructed to call the office if he/she is having issues with the PAP device.    I advised patient to obtain sufficient amount of sleep --  7 to 8 hours at least in a 24 hr period.  Patient was advised to follow good sleep hygiene.  Patient was advised NOT to engage in activities requiring concentration and/or vigilance if he/she is and  sleepy.  Patient is NOT to drive if he/she is sleepy.

## 2016-03-01 ENCOUNTER — Encounter: Payer: Self-pay | Admitting: Family Medicine

## 2016-03-01 ENCOUNTER — Ambulatory Visit (INDEPENDENT_AMBULATORY_CARE_PROVIDER_SITE_OTHER): Payer: BLUE CROSS/BLUE SHIELD | Admitting: Family Medicine

## 2016-03-01 VITALS — BP 140/84 | HR 68 | Temp 97.6°F | Ht 71.0 in | Wt 324.8 lb

## 2016-03-01 DIAGNOSIS — I10 Essential (primary) hypertension: Secondary | ICD-10-CM

## 2016-03-01 DIAGNOSIS — E1165 Type 2 diabetes mellitus with hyperglycemia: Secondary | ICD-10-CM | POA: Diagnosis not present

## 2016-03-01 DIAGNOSIS — IMO0002 Reserved for concepts with insufficient information to code with codable children: Secondary | ICD-10-CM

## 2016-03-01 DIAGNOSIS — E1151 Type 2 diabetes mellitus with diabetic peripheral angiopathy without gangrene: Secondary | ICD-10-CM | POA: Diagnosis not present

## 2016-03-01 DIAGNOSIS — F418 Other specified anxiety disorders: Secondary | ICD-10-CM

## 2016-03-01 DIAGNOSIS — F1520 Other stimulant dependence, uncomplicated: Secondary | ICD-10-CM | POA: Diagnosis not present

## 2016-03-01 DIAGNOSIS — E785 Hyperlipidemia, unspecified: Secondary | ICD-10-CM | POA: Diagnosis not present

## 2016-03-01 LAB — CBC WITH DIFFERENTIAL/PLATELET
Basophils Absolute: 0 10*3/uL (ref 0.0–0.1)
Basophils Relative: 0.6 % (ref 0.0–3.0)
Eosinophils Absolute: 0.2 10*3/uL (ref 0.0–0.7)
Eosinophils Relative: 4.1 % (ref 0.0–5.0)
HCT: 44.5 % (ref 39.0–52.0)
Hemoglobin: 15 g/dL (ref 13.0–17.0)
Lymphocytes Relative: 30.1 % (ref 12.0–46.0)
Lymphs Abs: 1.8 10*3/uL (ref 0.7–4.0)
MCHC: 33.8 g/dL (ref 30.0–36.0)
MCV: 84.7 fl (ref 78.0–100.0)
Monocytes Absolute: 0.4 10*3/uL (ref 0.1–1.0)
Monocytes Relative: 7.3 % (ref 3.0–12.0)
Neutro Abs: 3.4 10*3/uL (ref 1.4–7.7)
Neutrophils Relative %: 57.9 % (ref 43.0–77.0)
Platelets: 264 10*3/uL (ref 150.0–400.0)
RBC: 5.25 Mil/uL (ref 4.22–5.81)
RDW: 13.7 % (ref 11.5–15.5)
WBC: 5.9 10*3/uL (ref 4.0–10.5)

## 2016-03-01 LAB — COMPREHENSIVE METABOLIC PANEL
ALT: 24 U/L (ref 0–53)
AST: 14 U/L (ref 0–37)
Albumin: 3.9 g/dL (ref 3.5–5.2)
Alkaline Phosphatase: 52 U/L (ref 39–117)
BUN: 13 mg/dL (ref 6–23)
CO2: 23 mEq/L (ref 19–32)
Calcium: 9.5 mg/dL (ref 8.4–10.5)
Chloride: 112 mEq/L (ref 96–112)
Creatinine, Ser: 0.76 mg/dL (ref 0.40–1.50)
GFR: 114.12 mL/min (ref >60.00)
Glucose, Bld: 214 mg/dL — ABNORMAL HIGH (ref 70–99)
Potassium: 3.7 mEq/L (ref 3.5–5.1)
Sodium: 131 mEq/L — ABNORMAL LOW (ref 135–145)
Total Bilirubin: 0.4 mg/dL (ref 0.2–1.2)
Total Protein: 6.6 g/dL (ref 6.0–8.3)

## 2016-03-01 LAB — LIPID PANEL
Cholesterol: 190 mg/dL (ref 0–200)
HDL: 42.4 mg/dL (ref >39.00)
LDL Cholesterol: 110 mg/dL — ABNORMAL HIGH (ref 0–99)
NonHDL: 147.46
Total CHOL/HDL Ratio: 4
Triglycerides: 189 mg/dL — ABNORMAL HIGH (ref 0.0–149.0)
VLDL: 37.8 mg/dL (ref 0.0–40.0)

## 2016-03-01 LAB — HEMOGLOBIN A1C: Hgb A1c MFr Bld: 8.5 % — ABNORMAL HIGH (ref 4.6–6.5)

## 2016-03-01 MED ORDER — AMLODIPINE BESYLATE 10 MG PO TABS: 10.0000 mg | ORAL_TABLET | Freq: Every day | ORAL | Status: DC

## 2016-03-01 MED ORDER — METFORMIN HCL 1000 MG PO TABS: 1000.0000 mg | ORAL_TABLET | Freq: Two times a day (BID) | ORAL | Status: DC

## 2016-03-01 MED ORDER — LIRAGLUTIDE 18 MG/3ML ~~LOC~~ SOPN: PEN_INJECTOR | SUBCUTANEOUS | Status: DC

## 2016-03-01 MED ORDER — METOPROLOL SUCCINATE ER 100 MG PO TB24: 100.0000 mg | ORAL_TABLET | Freq: Every day | ORAL | Status: DC

## 2016-03-01 MED ORDER — GLIPIZIDE 10 MG PO TABS: 10.0000 mg | ORAL_TABLET | Freq: Two times a day (BID) | ORAL | Status: DC

## 2016-03-01 MED ORDER — PRAVASTATIN SODIUM 10 MG PO TABS: 10.0000 mg | ORAL_TABLET | Freq: Every day | ORAL | Status: DC

## 2016-03-01 NOTE — Assessment & Plan Note (Signed)
[facility] He is going to meeting regularly He also is not going to [facility] anymore and has ended the toxic relationship he was in

## 2016-03-01 NOTE — Assessment & Plan Note (Signed)
Pt given info for [facility] He will call them for an appointment

## 2016-03-01 NOTE — Progress Notes (Signed)
Patient ID: Tyler Brewer, male    DOB: 01-11-1961  Age: 55 y.o. MRN: [id number]    Subjective:  Subjective HPI Tyler Brewer presents for f/u [facility] for [facility].  He was admitted 6/24-6/25.  Pt was not set up with psych outpt.  He was given numbers for rehabs/ half way houses and never called.   Pt is not suicidal/ homicidal.     Review of Systems  Constitutional: Negative for diaphoresis, appetite change, fatigue and unexpected weight change.  Eyes: Negative for pain, redness and visual disturbance.  Respiratory: Negative for cough, chest tightness, shortness of breath and wheezing.   Cardiovascular: Negative for chest pain, palpitations and leg swelling.  Endocrine: Negative for cold intolerance, heat intolerance, polydipsia, polyphagia and polyuria.  Genitourinary: Negative for dysuria, frequency and difficulty urinating.  Neurological: Negative for dizziness, light-headedness, numbness and headaches.  Psychiatric/Behavioral: Positive for dysphoric mood and decreased concentration. Negative for suicidal ideas and sleep disturbance. The patient is nervous/anxious.     History Past Medical History  Diagnosis Date  . Hypertension   . Diabetes mellitus     poorly controlled by his report  . IBS (irritable bowel syndrome)   . History of narcotic addiction ([place])     past history of back pain  . Hypercholesteremia   . Testosterone deficiency   . Depression   . Obesity     Max weight was 390  . CAD (coronary artery disease)  cardiac cath with moderate disease in a septal branch of the ramus intermedius 04/01/2012  . Panic attacks   . OSA on CPAP     He has past surgical history that includes Cardiac catheterization and left heart catheterization with coronary angiogram (N/A, 03/31/2012).   His family history includes Diabetes type II in his father; Healthy in his daughter, mother, sister, and son; Hypertension in his father; Pancreatic disease (age of  onset: 43) in his father.He reports that he quit smoking about 3 months ago. He has never used smokeless tobacco. He reports that he does not drink alcohol or use illicit drugs.  Current Outpatient Prescriptions on File Prior to Visit  Medication Sig Dispense Refill  . furosemide (LASIX) 40 MG tablet Take 1 tablet (40 mg total) by mouth 2 (two) times daily. 60 tablet 0  . gabapentin (NEURONTIN) 300 MG capsule Take 1 capsule (300 mg total) by mouth 3 (three) times daily. 90 capsule 0  . hydrOXYzine (ATARAX/VISTARIL) 25 MG tablet Take 1 tablet (25 mg total) by mouth every 6 (six) hours as needed for anxiety. 30 tablet 0  . naproxen (NAPROSYN) 375 MG tablet Take 1 tablet (375 mg total) by mouth 2 (two) times daily. (Patient taking differently: Take 375 mg by mouth 2 (two) times daily as needed for mild pain or moderate pain. ) 20 tablet 0   No current facility-administered medications on file prior to visit.     Objective:  Objective Physical Exam  Constitutional: He is oriented to person, place, and time. Vital signs are normal. He appears well-developed and well-nourished. He is sleeping.  HENT:  Head: Normocephalic and atraumatic.  Mouth/Throat: Oropharynx is clear and moist.  Eyes: EOM are normal. Pupils are equal, round, and reactive to light.  Neck: Normal range of motion. Neck supple. No thyromegaly present.  Cardiovascular: Normal rate and regular rhythm.   No murmur heard. Pulmonary/Chest: Effort normal and breath sounds normal. No respiratory distress. He has no wheezes. He has no rales. He exhibits no  tenderness.  Musculoskeletal: He exhibits no edema or tenderness.  Neurological: He is alert and oriented to person, place, and time.  Skin: Skin is warm and dry.  Psychiatric: His behavior is normal. Judgment and thought content normal. His mood appears anxious. Thought content is not paranoid and not delusional. He exhibits a depressed mood. He expresses no homicidal and no suicidal  ideation. He expresses no suicidal plans and no homicidal plans.  Nursing note and vitals reviewed.  BP 140/84 mmHg  Pulse 68  Temp(Src) 97.6 F (36.4 C) (Oral)  Ht 5\' 11"  (1.803 m)  Wt 324 lb 12.8 oz (147.328 kg)  BMI 45.32 kg/m2  SpO2 98% Wt Readings from Last 3 Encounters:  03/01/16 324 lb 12.8 oz (147.328 kg)  02/29/16 324 lb (146.965 kg)  02/11/16 314 lb 8 oz (142.656 kg)     Lab Results  Component Value Date   WBC 5.9 03/01/2016   HGB 15.0 03/01/2016   HCT 44.5 03/01/2016   PLT 264.0 03/01/2016   GLUCOSE 214* 03/01/2016   CHOL 190 03/01/2016   TRIG 189.0* 03/01/2016   HDL 42.40 03/01/2016   LDLCALC 110* 03/01/2016   ALT 24 03/01/2016   AST 14 03/01/2016   NA 131* 03/01/2016   K 3.7 03/01/2016   CL 112 03/01/2016   CREATININE 0.76 03/01/2016   BUN 13 03/01/2016   CO2 23 03/01/2016   TSH 2.01 11/07/2015   INR 1.1 07/30/2013   HGBA1C 8.5* 03/01/2016    No results found.   Assessment & Plan:  Plan I am having Mr. Nuncio maintain his naproxen, furosemide, gabapentin, hydrOXYzine, pravastatin, metoprolol succinate, metFORMIN, Liraglutide, glipiZIDE, and amLODipine.  Meds ordered this encounter  Medications  . pravastatin (PRAVACHOL) 10 MG tablet    Sig: Take 1 tablet (10 mg total) by mouth daily. Reported on 12/09/2015    Dispense:  30 tablet    Refill:  0  . metoprolol succinate (TOPROL-XL) 100 MG 24 hr tablet    Sig: Take 1 tablet (100 mg total) by mouth daily. Take with or immediately following a meal.    Dispense:  30 tablet    Refill:  0  . metFORMIN (GLUCOPHAGE) 1000 MG tablet    Sig: Take 1 tablet (1,000 mg total) by mouth 2 (two) times daily with a meal.    Dispense:  60 tablet    Refill:  0  . Liraglutide (VICTOZA) 18 MG/3ML SOPN    Sig: 1.8 mg sq qd    Dispense:  6 mL    Refill:  0  . glipiZIDE (GLUCOTROL) 10 MG tablet    Sig: Take 1 tablet (10 mg total) by mouth 2 (two) times daily before a meal.    Dispense:  60 tablet    Refill:  2  .  amLODipine (NORVASC) 10 MG tablet    Sig: Take 1 tablet (10 mg total) by mouth daily.    Dispense:  30 tablet    Refill:  0    Problem List Items Addressed This Visit      Unprioritized   HTN (hypertension) (Chronic)   Relevant Medications   pravastatin (PRAVACHOL) 10 MG tablet   metoprolol succinate (TOPROL-XL) 100 MG 24 hr tablet   amLODipine (NORVASC) 10 MG tablet   Other Relevant Orders   Comprehensive metabolic panel (Completed)   CBC with Differential/Platelet (Completed)   Hemoglobin A1c (Completed)   Lipid panel (Completed)   POCT urinalysis dipstick   Microalbumin / creatinine urine ratio  Depression with anxiety    Pt given info for [facility] He will call them for an appointment      Methamphetamine use disorder, severe, dependence ([place])    [facility] He is going to meeting regularly He also is not going to [facility] anymore and has ended the toxic relationship he was in       Other Visit Diagnoses    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[place])    -  Primary    DM (diabetes mellitus) type II uncontrolled, periph vascular disorder ([facility])        Relevant Medications    pravastatin (PRAVACHOL) 10 MG tablet    metoprolol succinate (TOPROL-XL) 100 MG 24 hr tablet    metFORMIN (GLUCOPHAGE) 1000 MG tablet    Liraglutide (VICTOZA) 18 MG/3ML SOPN    glipiZIDE (GLUCOTROL) 10 MG tablet    amLODipine (NORVASC) 10 MG tablet    Other Relevant Orders    Comprehensive metabolic panel (Completed)    CBC with Differential/Platelet (Completed)    Hemoglobin A1c (Completed)    Lipid panel (Completed)    POCT urinalysis dipstick    Microalbumin / creatinine urine ratio    Hyperlipidemia LDL goal <70        Relevant Medications    pravastatin (PRAVACHOL) 10 MG tablet    metoprolol succinate (TOPROL-XL) 100 MG 24 hr tablet    amLODipine (NORVASC) 10 MG tablet    Other Relevant Orders    Comprehensive metabolic panel (Completed)    CBC with Differential/Platelet (Completed)      Hemoglobin A1c (Completed)    Lipid panel (Completed)    POCT urinalysis dipstick    Microalbumin / creatinine urine ratio    Hyperlipidemia        Relevant Medications    pravastatin (PRAVACHOL) 10 MG tablet    metoprolol succinate (TOPROL-XL) 100 MG 24 hr tablet    amLODipine (NORVASC) 10 MG tablet       Follow-up: Return in about 3 months (around 06/01/2016), or if symptoms worsen or fail to improve, for hypertension, hyperlipidemia, diabetes II.  [name], DO

## 2016-03-01 NOTE — Patient Instructions (Signed)

## 2016-03-01 NOTE — Progress Notes (Signed)
Pre visit review using our clinic review tool, if applicable. No additional management support is needed unless otherwise documented below in the visit note. 

## 2016-03-05 ENCOUNTER — Encounter: Payer: Self-pay | Admitting: Neurology

## 2016-03-06 ENCOUNTER — Encounter: Payer: Self-pay | Admitting: Family Medicine

## 2016-03-06 NOTE — Telephone Encounter (Signed)
He can have 1 each if we have

## 2016-03-07 ENCOUNTER — Other Ambulatory Visit: Payer: Self-pay | Admitting: Family Medicine

## 2016-03-07 ENCOUNTER — Encounter: Payer: Self-pay | Admitting: Family Medicine

## 2016-03-07 DIAGNOSIS — E1165 Type 2 diabetes mellitus with hyperglycemia: Principal | ICD-10-CM

## 2016-03-07 DIAGNOSIS — E1151 Type 2 diabetes mellitus with diabetic peripheral angiopathy without gangrene: Secondary | ICD-10-CM

## 2016-03-07 DIAGNOSIS — IMO0002 Reserved for concepts with insufficient information to code with codable children: Secondary | ICD-10-CM

## 2016-03-07 NOTE — Telephone Encounter (Signed)
Patient returned my call. Advised we have Trulicity available. States he was taking the 0.75mg /0.53ml dose. States he will stop by in the am to pick up sample. Placed in [place] bag in refrigerator.

## 2016-03-07 NOTE — Telephone Encounter (Signed)
We have Trulicity available. Called patient to see what dose he uses. Left message for return call.

## 2016-03-08 ENCOUNTER — Ambulatory Visit: Payer: BLUE CROSS/BLUE SHIELD | Admitting: Neurology

## 2016-03-12 ENCOUNTER — Encounter: Payer: Self-pay | Admitting: Pulmonary Disease

## 2016-03-27 ENCOUNTER — Encounter: Payer: Self-pay | Admitting: Neurology

## 2016-03-30 ENCOUNTER — Ambulatory Visit: Payer: BLUE CROSS/BLUE SHIELD | Admitting: Neurology

## 2016-04-17 ENCOUNTER — Encounter: Payer: Self-pay | Admitting: Family Medicine

## 2016-04-18 ENCOUNTER — Encounter: Payer: Self-pay | Admitting: Family Medicine

## 2016-04-18 ENCOUNTER — Encounter: Payer: Self-pay | Admitting: Neurology

## 2016-04-18 NOTE — Telephone Encounter (Signed)
Received a call from [place] requesting to speak with Dr.Lowne, Call has been forwarded to [employer]

## 2016-04-19 ENCOUNTER — Other Ambulatory Visit: Payer: Self-pay | Admitting: *Deleted

## 2016-04-19 MED ORDER — NORTRIPTYLINE HCL 10 MG PO CAPS: 20.0000 mg | ORAL_CAPSULE | Freq: Every day | ORAL | 3 refills | Status: DC

## 2016-04-20 NOTE — Telephone Encounter (Signed)
I talked with [facility] MD office this week. Pt had bp measure discrepancy. Lt arm bp >than rt side bp. I talked with Tyler Brewer and advised could see him day I talked to MD or next day. Kim talked with pt. Appears pt could only come at later date. See other note regarding his upcoming appointment.

## 2016-04-20 NOTE — Telephone Encounter (Signed)
Correction. Call was from Dr. Posey Pronto. When I got on phone he was describing eye findings. Therefore thought was talking to Eye MD. Reviewed chart later saw was neurologist.

## 2016-04-24 ENCOUNTER — Encounter: Payer: Self-pay | Admitting: Family Medicine

## 2016-04-24 ENCOUNTER — Ambulatory Visit (INDEPENDENT_AMBULATORY_CARE_PROVIDER_SITE_OTHER): Payer: BLUE CROSS/BLUE SHIELD | Admitting: Family Medicine

## 2016-04-24 VITALS — BP 142/100 | HR 80 | Temp 99.1°F | Ht 71.0 in | Wt 335.4 lb

## 2016-04-24 DIAGNOSIS — IMO0002 Reserved for concepts with insufficient information to code with codable children: Secondary | ICD-10-CM

## 2016-04-24 DIAGNOSIS — I10 Essential (primary) hypertension: Secondary | ICD-10-CM

## 2016-04-24 DIAGNOSIS — R079 Chest pain, unspecified: Secondary | ICD-10-CM | POA: Diagnosis not present

## 2016-04-24 DIAGNOSIS — E1151 Type 2 diabetes mellitus with diabetic peripheral angiopathy without gangrene: Secondary | ICD-10-CM | POA: Diagnosis not present

## 2016-04-24 DIAGNOSIS — E1140 Type 2 diabetes mellitus with diabetic neuropathy, unspecified: Secondary | ICD-10-CM

## 2016-04-24 DIAGNOSIS — F411 Generalized anxiety disorder: Secondary | ICD-10-CM

## 2016-04-24 DIAGNOSIS — E1165 Type 2 diabetes mellitus with hyperglycemia: Secondary | ICD-10-CM

## 2016-04-24 MED ORDER — VALSARTAN 160 MG PO TABS: 160.0000 mg | ORAL_TABLET | Freq: Every day | ORAL | 2 refills | Status: DC

## 2016-04-24 MED ORDER — ALPRAZOLAM 0.25 MG PO TABS: 0.2500 mg | ORAL_TABLET | Freq: Two times a day (BID) | ORAL | 0 refills | Status: DC | PRN

## 2016-04-24 MED ORDER — GLIPIZIDE 10 MG PO TABS: 10.0000 mg | ORAL_TABLET | Freq: Two times a day (BID) | ORAL | 2 refills | Status: DC

## 2016-04-24 MED ORDER — ESCITALOPRAM OXALATE 20 MG PO TABS: 20.0000 mg | ORAL_TABLET | Freq: Every day | ORAL | 2 refills | Status: DC

## 2016-04-24 MED ORDER — METFORMIN HCL 1000 MG PO TABS: 1000.0000 mg | ORAL_TABLET | Freq: Two times a day (BID) | ORAL | 2 refills | Status: DC

## 2016-04-24 NOTE — Progress Notes (Signed)
Patient ID: Tyler Brewer, male    DOB: 1961/01/07  Age: 55 y.o. MRN: [id number]    Subjective:  Subjective  HPI  Tyler Brewer presents for f/u bp and dm --- pt was in eye dr and bp was high so he was sent here.  He states he has been having chest pain off and on but when he goes to ER they find nothing wrong--- so he does not want to go back.  Review of Systems  Constitutional: Positive for fatigue and fever. Negative for appetite change, diaphoresis and unexpected weight change.  Eyes: Negative for pain, redness and visual disturbance.  Respiratory: Negative for cough, chest tightness, shortness of breath and wheezing.   Cardiovascular: Positive for chest pain. Negative for leg swelling.  Endocrine: Negative for cold intolerance, heat intolerance, polydipsia, polyphagia and polyuria.  Genitourinary: Negative for difficulty urinating, dysuria and frequency.  Neurological: Positive for headaches. Negative for dizziness, light-headedness and numbness.    History Past Medical History:  Diagnosis Date  . CAD (coronary artery disease)  cardiac cath with moderate disease in a septal branch of the ramus intermedius 04/01/2012  . Depression   . Diabetes mellitus    poorly controlled by his report  . History of narcotic addiction ([place])    past history of back pain  . Hypercholesteremia   . Hypertension   . IBS (irritable bowel syndrome)   . Obesity    Max weight was 390  . OSA on CPAP   . Panic attacks   . Testosterone deficiency     He has a past surgical history that includes Cardiac catheterization and left heart catheterization with coronary angiogram (N/A, 03/31/2012).   His family history includes Diabetes type II in his father; Healthy in his daughter, mother, sister, and son; Hypertension in his father; Pancreatic disease (age of onset: 20) in his father.He reports that he quit smoking about 5 months ago. He has never used smokeless tobacco. He reports that he does not drink  alcohol or use drugs.  Current Outpatient Prescriptions on File Prior to Visit  Medication Sig Dispense Refill  . amLODipine (NORVASC) 10 MG tablet Take 1 tablet (10 mg total) by mouth daily. 30 tablet 0  . furosemide (LASIX) 40 MG tablet Take 1 tablet (40 mg total) by mouth 2 (two) times daily. 60 tablet 0  . gabapentin (NEURONTIN) 300 MG capsule Take 1 capsule (300 mg total) by mouth 3 (three) times daily. 90 capsule 0  . hydrOXYzine (ATARAX/VISTARIL) 25 MG tablet Take 1 tablet (25 mg total) by mouth every 6 (six) hours as needed for anxiety. 30 tablet 0  . Liraglutide (VICTOZA) 18 MG/3ML SOPN 1.8 mg sq qd 6 mL 0  . metoprolol succinate (TOPROL-XL) 100 MG 24 hr tablet Take 1 tablet (100 mg total) by mouth daily. Take with or immediately following a meal. 30 tablet 0  . naproxen (NAPROSYN) 375 MG tablet Take 1 tablet (375 mg total) by mouth 2 (two) times daily. (Patient taking differently: Take 375 mg by mouth 2 (two) times daily as needed for mild pain or moderate pain. ) 20 tablet 0  . nortriptyline (PAMELOR) 10 MG capsule Take 2 capsules (20 mg total) by mouth at bedtime. 60 capsule 3  . pravastatin (PRAVACHOL) 10 MG tablet Take 1 tablet (10 mg total) by mouth daily. Reported on 12/09/2015 30 tablet 0   No current facility-administered medications on file prior to visit.      Objective:  Objective  Physical Exam  Constitutional: He is oriented to person, place, and time. Vital signs are normal. He appears well-developed and well-nourished. He is sleeping.  HENT:  Head: Normocephalic and atraumatic.  Mouth/Throat: Oropharynx is clear and moist.  Eyes: EOM are normal. Pupils are equal, round, and reactive to light.  Neck: Normal range of motion. Neck supple. No thyromegaly present.  Cardiovascular: Normal rate and regular rhythm.   No murmur heard. Pulmonary/Chest: Effort normal and breath sounds normal. No respiratory distress. He has no wheezes. He has no rales. He exhibits no  tenderness.  Musculoskeletal: He exhibits no edema or tenderness.  Neurological: He is alert and oriented to person, place, and time.  Skin: Skin is warm and dry.  Psychiatric: He has a normal mood and affect. His behavior is normal. Judgment and thought content normal.  Nursing note and vitals reviewed.  BP (!) 142/100 (BP Location: Left Arm, Patient Position: Sitting, Cuff Size: Normal)   Pulse 80   Temp 99.1 F (37.3 C) (Oral)   Ht 5\' 11"  (1.803 m)   Wt (!) 335 lb 6 oz (152.1 kg)   SpO2 97%   BMI 46.78 kg/m  Wt Readings from Last 3 Encounters:  04/24/16 (!) 335 lb 6 oz (152.1 kg)  03/01/16 (!) 324 lb 12.8 oz (147.3 kg)  02/29/16 (!) 324 lb (147 kg)     Lab Results  Component Value Date   WBC 6.7 04/25/2016   HGB 14.4 04/25/2016   HCT 41.8 04/25/2016   PLT 304.0 04/25/2016   GLUCOSE 130 (H) 04/25/2016   CHOL 191 04/25/2016   TRIG 123.0 04/25/2016   HDL 49.40 04/25/2016   LDLCALC 117 (H) 04/25/2016   ALT 18 04/25/2016   AST 13 04/25/2016   NA 139 04/25/2016   K 3.6 04/25/2016   CL 104 04/25/2016   CREATININE 0.82 04/25/2016   BUN 11 04/25/2016   CO2 25 04/25/2016   TSH 2.89 04/25/2016   INR 1.1 07/30/2013   HGBA1C 7.3 (H) 04/25/2016    No results found.   Assessment & Plan:  Plan  I am having Mr. Tyler Brewer start on valsartan, escitalopram, and ALPRAZolam. I am also having him maintain his naproxen, furosemide, gabapentin, hydrOXYzine, pravastatin, metoprolol succinate, Liraglutide, amLODipine, nortriptyline, glipiZIDE, and metFORMIN.  Meds ordered this encounter  Medications  . glipiZIDE (GLUCOTROL) 10 MG tablet    Sig: Take 1 tablet (10 mg total) by mouth 2 (two) times daily before a meal.    Dispense:  60 tablet    Refill:  2  . metFORMIN (GLUCOPHAGE) 1000 MG tablet    Sig: Take 1 tablet (1,000 mg total) by mouth 2 (two) times daily with a meal.    Dispense:  60 tablet    Refill:  2  . valsartan (DIOVAN) 160 MG tablet    Sig: Take 1 tablet (160 mg  total) by mouth daily.    Dispense:  30 tablet    Refill:  2  . escitalopram (LEXAPRO) 20 MG tablet    Sig: Take 1 tablet (20 mg total) by mouth daily.    Dispense:  30 tablet    Refill:  2  . ALPRAZolam (XANAX) 0.25 MG tablet    Sig: Take 1 tablet (0.25 mg total) by mouth 2 (two) times daily as needed for anxiety.    Dispense:  10 tablet    Refill:  0    Problem List Items Addressed This Visit      Unprioritized   HTN (hypertension) (Chronic)  Relevant Medications   valsartan (DIOVAN) 160 MG tablet   Other Relevant Orders   CBC with Differential/Platelet (Completed)   Hemoglobin A1c (Completed)   Lipid panel (Completed)   POCT urinalysis dipstick   Microalbumin / creatinine urine ratio   TSH (Completed)   Comprehensive metabolic panel (Completed)   Chest pain - Primary    Pt did not want to go back to Er Check labs If cp returns-- go to ER      Relevant Orders   EKG 12-Lead (Completed)   CBC with Differential/Platelet (Completed)   Hemoglobin A1c (Completed)   Lipid panel (Completed)   POCT urinalysis dipstick   Microalbumin / creatinine urine ratio   TSH (Completed)   Comprehensive metabolic panel (Completed)   Troponin I (Completed)   Myocardial Perfusion Imaging   DM type 2, uncontrolled, 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) (Chronic)    Now seems to becontrolled Check labs      Relevant Medications   glipiZIDE (GLUCOTROL) 10 MG tablet   metFORMIN (GLUCOPHAGE) 1000 MG tablet   valsartan (DIOVAN) 160 MG tablet   HTN (hypertension), benign (Chronic)    Uncontrolled---- add diovan rto 2-3 weeks for bp check      Relevant Medications   valsartan (DIOVAN) 160 MG tablet    Other Visit Diagnoses    DM (diabetes mellitus) type II uncontrolled, periph vascular disorder ([facility])       Relevant Medications   glipiZIDE (GLUCOTROL) 10 MG tablet   metFORMIN (GLUCOPHAGE) 1000 MG tablet   valsartan (DIOVAN) 160 MG tablet   Other Relevant Orders   CBC with Differential/Platelet  (Completed)   Hemoglobin A1c (Completed)   Lipid panel (Completed)   POCT urinalysis dipstick   Microalbumin / creatinine urine ratio   TSH (Completed)   Comprehensive metabolic panel (Completed)   Generalized anxiety disorder       Relevant Medications   escitalopram (LEXAPRO) 20 MG tablet   ALPRAZolam (XANAX) 0.25 MG tablet      Follow-up: Return in about 2 weeks (around 05/08/2016) for hypertension.  [name], DO

## 2016-04-24 NOTE — Progress Notes (Signed)
Pre visit review using our clinic review tool, if applicable. No additional management support is needed unless otherwise documented below in the visit note. 

## 2016-04-24 NOTE — Patient Instructions (Signed)
Nonspecific Chest Pain  °Chest pain can be caused by many different conditions. There is always a chance that your pain could be related to something serious, such as a heart attack or a blood clot in your lungs. Chest pain can also be caused by conditions that are not life-threatening. If you have chest pain, it is very important to follow up with your health care provider. °CAUSES  °Chest pain can be caused by: °· Heartburn. °· Pneumonia or bronchitis. °· Anxiety or stress. °· Inflammation around your heart (pericarditis) or lung (pleuritis or pleurisy). °· A blood clot in your lung. °· A collapsed lung (pneumothorax). It can develop suddenly on its own (spontaneous pneumothorax) or from trauma to the chest. °· Shingles infection (varicella-zoster virus). °· Heart attack. °· Damage to the bones, muscles, and cartilage that make up your chest wall. This can include: °¨ Bruised bones due to injury. °¨ Strained muscles or cartilage due to frequent or repeated coughing or overwork. °¨ Fracture to one or more ribs. °¨ Sore cartilage due to inflammation (costochondritis). °RISK FACTORS  °Risk factors for chest pain may include: °· Activities that increase your risk for trauma or injury to your chest. °· Respiratory infections or conditions that cause frequent coughing. °· Medical conditions or overeating that can cause heartburn. °· Heart disease or family history of heart disease. °· Conditions or health behaviors that increase your risk of developing a blood clot. °· Having had chicken pox (varicella zoster). °SIGNS AND SYMPTOMS °Chest pain can feel like: °· Burning or tingling on the surface of your chest or deep in your chest. °· Crushing, pressure, aching, or squeezing pain. °· Dull or sharp pain that is worse when you move, cough, or take a deep breath. °· Pain that is also felt in your back, neck, shoulder, or arm, or pain that spreads to any of these areas. °Your chest pain may come and go, or it may stay  constant. °DIAGNOSIS °Lab tests or other studies may be needed to find the cause of your pain. Your health care provider may have you take a test called an ambulatory ECG (electrocardiogram). An ECG records your heartbeat patterns at the time the test is performed. You may also have other tests, such as: °· Transthoracic echocardiogram (TTE). During echocardiography, sound waves are used to create a picture of all of the heart structures and to look at how blood flows through your heart. °· Transesophageal echocardiogram (TEE). This is a more advanced imaging test that obtains images from inside your body. It allows your health care provider to see your heart in finer detail. °· Cardiac monitoring. This allows your health care provider to monitor your heart rate and rhythm in real time. °· Holter monitor. This is a portable device that records your heartbeat and can help to diagnose abnormal heartbeats. It allows your health care provider to track your heart activity for several days, if needed. °· Stress tests. These can be done through exercise or by taking medicine that makes your heart beat more quickly. °· Blood tests. °· Imaging tests. °TREATMENT  °Your treatment depends on what is causing your chest pain. Treatment may include: °· Medicines. These may include: °¨ Acid blockers for heartburn. °¨ Anti-inflammatory medicine. °¨ Pain medicine for inflammatory conditions. °¨ Antibiotic medicine, if an infection is present. °¨ Medicines to dissolve blood clots. °¨ Medicines to treat coronary artery disease. °· Supportive care for conditions that do not require medicines. This may include: °¨ Resting. °¨ Applying heat   or cold packs to injured areas. °¨ Limiting activities until pain decreases. °HOME CARE INSTRUCTIONS °· If you were prescribed an antibiotic medicine, finish it all even if you start to feel better. °· Avoid any activities that bring on chest pain. °· Do not use any tobacco products, including  cigarettes, chewing tobacco, or electronic cigarettes. If you need help quitting, ask your health care provider. °· Do not drink alcohol. °· Take medicines only as directed by your health care provider. °· Keep all follow-up visits as directed by your health care provider. This is important. This includes any further testing if your chest pain does not go away. °· If heartburn is the cause for your chest pain, you may be told to keep your head raised (elevated) while sleeping. This reduces the chance that acid will go from your stomach into your esophagus. °· Make lifestyle changes as directed by your health care provider. These may include: °¨ Getting regular exercise. Ask your health care provider to suggest some activities that are safe for you. °¨ Eating a heart-healthy diet. A registered dietitian can help you to learn healthy eating options. °¨ Maintaining a healthy weight. °¨ Managing diabetes, if necessary. °¨ Reducing stress. °SEEK MEDICAL CARE IF: °· Your chest pain does not go away after treatment. °· You have a rash with blisters on your chest. °· You have a fever. °SEEK IMMEDIATE MEDICAL CARE IF:  °· Your chest pain is worse. °· You have an increasing cough, or you cough up blood. °· You have severe abdominal pain. °· You have severe weakness. °· You faint. °· You have chills. °· You have sudden, unexplained chest discomfort. °· You have sudden, unexplained discomfort in your arms, back, neck, or jaw. °· You have shortness of breath at any time. °· You suddenly start to sweat, or your skin gets clammy. °· You feel nauseous or you vomit. °· You suddenly feel light-headed or dizzy. °· Your heart begins to beat quickly, or it feels like it is skipping beats. °These symptoms may represent a serious problem that is an emergency. Do not wait to see if the symptoms will go away. Get medical help right away. Call your local emergency services (911 in the [place] Do not drive yourself to the hospital. °  °This  information is not intended to replace advice given to you by your health care provider. Make sure you discuss any questions you have with your health care provider. °  °Document Released: 05/16/2005 Document Revised: 08/27/2014 Document Reviewed: 03/12/2014 °Elsevier Interactive Patient Education ©2016 [employer] ° °

## 2016-04-25 ENCOUNTER — Other Ambulatory Visit (INDEPENDENT_AMBULATORY_CARE_PROVIDER_SITE_OTHER): Payer: BLUE CROSS/BLUE SHIELD

## 2016-04-25 DIAGNOSIS — E1165 Type 2 diabetes mellitus with hyperglycemia: Secondary | ICD-10-CM | POA: Diagnosis not present

## 2016-04-25 DIAGNOSIS — R079 Chest pain, unspecified: Secondary | ICD-10-CM | POA: Diagnosis not present

## 2016-04-25 DIAGNOSIS — IMO0002 Reserved for concepts with insufficient information to code with codable children: Secondary | ICD-10-CM

## 2016-04-25 DIAGNOSIS — E1151 Type 2 diabetes mellitus with diabetic peripheral angiopathy without gangrene: Secondary | ICD-10-CM

## 2016-04-25 DIAGNOSIS — I10 Essential (primary) hypertension: Secondary | ICD-10-CM | POA: Diagnosis not present

## 2016-04-25 LAB — COMPREHENSIVE METABOLIC PANEL
ALT: 18 U/L (ref 0–53)
AST: 13 U/L (ref 0–37)
Albumin: 4 g/dL (ref 3.5–5.2)
Alkaline Phosphatase: 61 U/L (ref 39–117)
BUN: 11 mg/dL (ref 6–23)
CO2: 25 mEq/L (ref 19–32)
Calcium: 8.8 mg/dL (ref 8.4–10.5)
Chloride: 104 mEq/L (ref 96–112)
Creatinine, Ser: 0.82 mg/dL (ref 0.40–1.50)
GFR: 104.48 mL/min (ref >60.00)
Glucose, Bld: 130 mg/dL — ABNORMAL HIGH (ref 70–99)
Potassium: 3.6 mEq/L (ref 3.5–5.1)
Sodium: 139 mEq/L (ref 135–145)
Total Bilirubin: 0.4 mg/dL (ref 0.2–1.2)
Total Protein: 6.8 g/dL (ref 6.0–8.3)

## 2016-04-25 LAB — CBC WITH DIFFERENTIAL/PLATELET
Basophils Absolute: 0 10*3/uL (ref 0.0–0.1)
Basophils Relative: 0.7 % (ref 0.0–3.0)
Eosinophils Absolute: 0.2 10*3/uL (ref 0.0–0.7)
Eosinophils Relative: 3.5 % (ref 0.0–5.0)
HCT: 41.8 % (ref 39.0–52.0)
Hemoglobin: 14.4 g/dL (ref 13.0–17.0)
Lymphocytes Relative: 31.7 % (ref 12.0–46.0)
Lymphs Abs: 2.1 10*3/uL (ref 0.7–4.0)
MCHC: 34.5 g/dL (ref 30.0–36.0)
MCV: 85.8 fl (ref 78.0–100.0)
Monocytes Absolute: 0.6 10*3/uL (ref 0.1–1.0)
Monocytes Relative: 9.1 % (ref 3.0–12.0)
Neutro Abs: 3.7 10*3/uL (ref 1.4–7.7)
Neutrophils Relative %: 55 % (ref 43.0–77.0)
Platelets: 304 10*3/uL (ref 150.0–400.0)
RBC: 4.88 Mil/uL (ref 4.22–5.81)
RDW: 14.5 % (ref 11.5–15.5)
WBC: 6.7 10*3/uL (ref 4.0–10.5)

## 2016-04-25 LAB — HEMOGLOBIN A1C: Hgb A1c MFr Bld: 7.3 % — ABNORMAL HIGH (ref 4.6–6.5)

## 2016-04-25 LAB — TSH: TSH: 2.89 u[IU]/mL (ref 0.35–4.50)

## 2016-04-25 LAB — LIPID PANEL
Cholesterol: 191 mg/dL (ref 0–200)
HDL: 49.4 mg/dL (ref >39.00)
LDL Cholesterol: 117 mg/dL — ABNORMAL HIGH (ref 0–99)
NonHDL: 141.65
Total CHOL/HDL Ratio: 4
Triglycerides: 123 mg/dL (ref 0.0–149.0)
VLDL: 24.6 mg/dL (ref 0.0–40.0)

## 2016-04-25 LAB — TROPONIN I: TNIDX: 0.01 ug/l (ref 0.00–0.06)

## 2016-04-25 NOTE — Assessment & Plan Note (Signed)
Pt did not want to go back to Er Check labs If cp returns-- go to ER

## 2016-04-25 NOTE — Telephone Encounter (Signed)
Tramadol 50 mg 1 po q6h prn  #30

## 2016-04-25 NOTE — Telephone Encounter (Signed)
Please advise on the Tramadol.   KP

## 2016-04-25 NOTE — Assessment & Plan Note (Signed)
Uncontrolled---- add diovan rto 2-3 weeks for bp check

## 2016-04-25 NOTE — Assessment & Plan Note (Signed)
Now seems to becontrolled Check labs

## 2016-04-26 LAB — POCT URINALYSIS DIPSTICK
Bilirubin, UA: NEGATIVE
Blood, UA: NEGATIVE
Glucose, UA: NEGATIVE
Ketones, UA: NEGATIVE
Leukocytes, UA: NEGATIVE
Nitrite, UA: NEGATIVE
Protein, UA: NEGATIVE
Spec Grav, UA: 1.02
Urobilinogen, UA: NEGATIVE
pH, UA: 6

## 2016-04-26 LAB — MICROALBUMIN / CREATININE URINE RATIO
Creatinine,U: 98 mg/dL
Microalb Creat Ratio: 0.7 mg/g (ref 0.0–30.0)
Microalb, Ur: <0.7 mg/dL (ref 0.0–1.9)

## 2016-04-26 MED ORDER — TRAMADOL HCL 50 MG PO TABS: 50.0000 mg | ORAL_TABLET | Freq: Three times a day (TID) | ORAL | 0 refills | Status: DC | PRN

## 2016-04-26 NOTE — Addendum Note (Signed)
Addended by: Ewing Schlein on: 04/26/2016 08:38 AM   Modules accepted: Orders

## 2016-04-30 ENCOUNTER — Encounter: Payer: Self-pay | Admitting: Family Medicine

## 2016-04-30 ENCOUNTER — Telehealth (HOSPITAL_COMMUNITY): Payer: Self-pay | Admitting: *Deleted

## 2016-04-30 ENCOUNTER — Telehealth: Payer: Self-pay | Admitting: *Deleted

## 2016-04-30 NOTE — Telephone Encounter (Signed)
Called and spoke with the pt and informed him of Dr. Carollee Herter note below.  Pt stated that he took another Xanax 1 hour ago and the pain has subsided and he would like to wait to see how he does, and he has a stress test tomorrow.  He stated that his chest don't hurt now it just feels tight.  Informed the pt that he needs to go to the ER.  Pt stated that he will wait.  Then the pt asked if I feel he needs to go to the ER.  Informed the pt that yes I feel he needs to go to the ER.  Pt stated that he will go to the ER.//AB/CMA    Please call the patient and have him go to the ER--- he is having chest pain.    Pt sent a message via of MyChart saying at this moment I am pondering going to the ER..the xanax really has not helped my chest pain but it has helped me relax. I have a stress test tomorrow and I may try to make it until then but I may go in the next little bit. my pain is constant but it does get worse and then better but it feels like someone is squeezing my chest

## 2016-04-30 NOTE — Telephone Encounter (Signed)
Patient given detailed instructions per Myocardial Perfusion Study Information Sheet for the test on 05/01/16 Patient notified to arrive 15 minutes early and that it is imperative to arrive on time for appointment to keep from having the test rescheduled.  If you need to cancel or reschedule your appointment, please call the office within 24 hours of your appointment. Failure to do so may result in a cancellation of your appointment, and a $50 no show fee. Patient verbalized understanding.  Hubbard Robinson, RN

## 2016-05-01 ENCOUNTER — Ambulatory Visit (HOSPITAL_COMMUNITY): Payer: BLUE CROSS/BLUE SHIELD | Attending: Cardiology

## 2016-05-01 DIAGNOSIS — R079 Chest pain, unspecified: Secondary | ICD-10-CM | POA: Insufficient documentation

## 2016-05-01 DIAGNOSIS — E109 Type 1 diabetes mellitus without complications: Secondary | ICD-10-CM | POA: Diagnosis not present

## 2016-05-01 DIAGNOSIS — I10 Essential (primary) hypertension: Secondary | ICD-10-CM | POA: Insufficient documentation

## 2016-05-01 IMAGING — NM NM MISC PROCEDURE
11 series · 60 of 60 positions shown · non-contrast
Comparison: none

[Series 1: wbr_s-proj_st stress · 6.51mm/px · 5 of 64 frames shown (1 of 2)]
[frame 6/64]
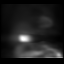
[frame 16/64]
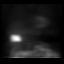
[frame 27/64]
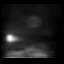
[frame 38/64]
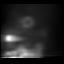
[frame 48/64]
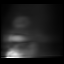

[Series 1: wbr_(id)_o1 rest-mc_(id)_sa · 6.5mm · 6.51mm/px · 6 of 64 frames shown]
[frame 6/64]
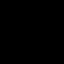
[frame 16/64]
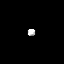
[frame 27/64]
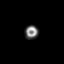
[frame 38/64]
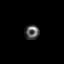
[frame 48/64]
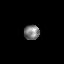
[frame 59/64]
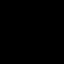

[Series 1: stress · 6.51mm/px · 5 of 64 frames shown (1 of 2)]
[frame 6/64]
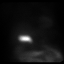
[frame 16/64]
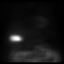
[frame 27/64]
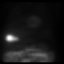
[frame 38/64]
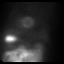
[frame 59/64]
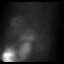

[Series 1: wbr_s-proj_st stress · 6.51mm/px · 6 of 512 frames shown (2 of 2)]
[frame 43/512]
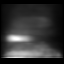
[frame 128/512]
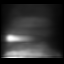
[frame 214/512]
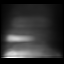
[frame 299/512]
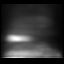
[frame 384/512]
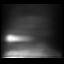
[frame 470/512]
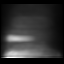

[Series 1: stress · 6.51mm/px · 5 of 512 frames shown (2 of 2)]
[frame 43/512]
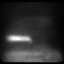
[frame 128/512]
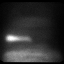
[frame 214/512]
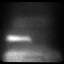
[frame 384/512]
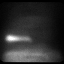
[frame 470/512]
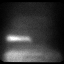

[Series 1: wbr_s-proj_st stress_(id)_sa · 6.5mm · 6.51mm/px · 6 of 64 frames shown (1 of 2)]
[frame 6/64]
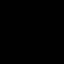
[frame 16/64]
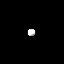
[frame 27/64]
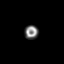
[frame 38/64]
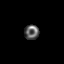
[frame 48/64]
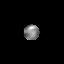
[frame 59/64]
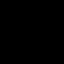

[Series 1: wbr_s-proj_st stress_(id)_sa · 6.5mm · 6.51mm/px · 5 of 512 frames shown (2 of 2)]
[frame 43/512]
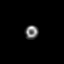
[frame 128/512]
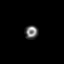
[frame 299/512]
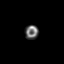
[frame 384/512]
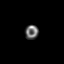
[frame 470/512]
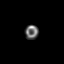

[Series 2: rest · 6.51mm/px · 6 of 64 frames shown]
[frame 6/64]
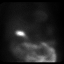
[frame 16/64]
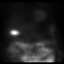
[frame 27/64]
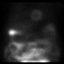
[frame 38/64]
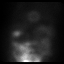
[frame 48/64]
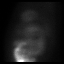
[frame 59/64]
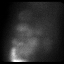

[Series 2: rest-mc · 6.51mm/px · 5 of 64 frames shown]
[frame 6/64]
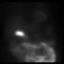
[frame 27/64]
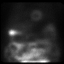
[frame 38/64]
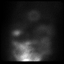
[frame 48/64]
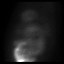
[frame 59/64]
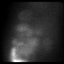

[Series 2: wbr_r-proj_st rest · 6.51mm/px · 6 of 64 frames shown]
[frame 6/64]
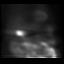
[frame 16/64]
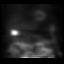
[frame 27/64]
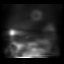
[frame 38/64]
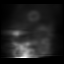
[frame 48/64]
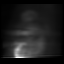
[frame 59/64]
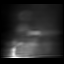

[Series 2: wbr_(id)_o1 rest-mc · 6.51mm/px · 5 of 64 frames shown]
[frame 16/64]
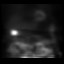
[frame 27/64]
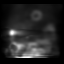
[frame 38/64]
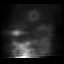
[frame 48/64]
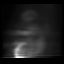
[frame 59/64]
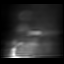

[60 of 60 positions shown; findings below may reference images not displayed]

Canned report from images found in remote index.

Refer to host system for actual result text.

## 2016-05-01 MED ORDER — REGADENOSON 0.4 MG/5ML IV SOLN
0.4000 mg | Freq: Once | INTRAVENOUS | Status: AC
  Administered 2016-05-01: 0.4 mg via INTRAVENOUS

## 2016-05-01 MED ORDER — TECHNETIUM TC 99M TETROFOSMIN IV KIT
32.6000 | PACK | Freq: Once | INTRAVENOUS | Status: AC | PRN
  Administered 2016-05-01: 33 via INTRAVENOUS
  Filled 2016-05-01: qty 33

## 2016-05-02 ENCOUNTER — Ambulatory Visit (HOSPITAL_COMMUNITY): Payer: BLUE CROSS/BLUE SHIELD | Attending: Cardiovascular Disease

## 2016-05-02 LAB — MYOCARDIAL PERFUSION IMAGING
LV dias vol: 154 mL (ref 62–150)
LV sys vol: 72 mL
Peak HR: 88 {beats}/min
RATE: 0.35
Rest HR: 68 {beats}/min
SDS: 2
SRS: 1
SSS: 3
TID: 1.16

## 2016-05-02 MED ORDER — TECHNETIUM TC 99M TETROFOSMIN IV KIT
32.4000 | PACK | Freq: Once | INTRAVENOUS | Status: AC | PRN
  Administered 2016-05-02: 32 via INTRAVENOUS
  Filled 2016-05-02: qty 32

## 2016-05-03 ENCOUNTER — Encounter: Payer: Self-pay | Admitting: Family Medicine

## 2016-05-07 ENCOUNTER — Other Ambulatory Visit: Payer: Self-pay

## 2016-05-07 DIAGNOSIS — R079 Chest pain, unspecified: Secondary | ICD-10-CM

## 2016-05-07 NOTE — Progress Notes (Signed)
Follow-up Visit   Date: 05/08/16   Tyler Brewer MRN: [id number] DOB: February 13, 1961   Interim History: Bassam Brewer is a 55 y.o.  right-handed Caucasian male with hypertension, diabetes mellitus (HbA1c 8.0), hyperlipidemia, ADHD returning to the clinic for follow-up of diabetic neuropathy.  The patient was accompanied to the clinic by self.  History of present illness: He was diagnosed with diabetes in 2008.  Soon afterwards, he developed intermittent numbness/tingling of the feet which progressively got worse in 2016. His pain is restricted to the feet. Symptoms are better in the morning and worse as the day progresses.  He tried gabapentin 300mg  BID, but because this made his ankle swelling worse and no relief, he stopped it.  He endorses imbalance, but has not had any falls related to this.  He endorses ankle instability and weakness, worse on the left.    He was wrongly incarcerated in Jan 2013 - Feb 2016 and reports that his achy and thorbbing leg and feet pain has worsened because of the shoes he would have to wear.  He was not in ankle shackles.  UPDATE 05/08/2016:  Here is here for follow-up appointment.  At his last visit, he was started on nortriptyline 20mg  and has noticed up to 70% benefit and less painful paresthesias of the feet.  He is having a number of other medical issues related to his eye, left chest discomfort, and anxiety. He broke up with his girlfriend in July after finding her with heroin.  He is now living with his parents and has noticed that he is much more sedentary and eats much more food causing weight gain.  He quit smoking this summer!  Medications:  Current Outpatient Prescriptions on File Prior to Visit  Medication Sig Dispense Refill  . ALPRAZolam (XANAX) 0.25 MG tablet Take 1 tablet (0.25 mg total) by mouth 2 (two) times daily as needed for anxiety. 10 tablet 0  . amLODipine (NORVASC) 10 MG tablet Take 1 tablet (10 mg total) by mouth daily. 30  tablet 0  . escitalopram (LEXAPRO) 20 MG tablet Take 1 tablet (20 mg total) by mouth daily. 30 tablet 2  . furosemide (LASIX) 40 MG tablet Take 1 tablet (40 mg total) by mouth 2 (two) times daily. 60 tablet 0  . glipiZIDE (GLUCOTROL) 10 MG tablet Take 1 tablet (10 mg total) by mouth 2 (two) times daily before a meal. 60 tablet 2  . hydrOXYzine (ATARAX/VISTARIL) 25 MG tablet Take 1 tablet (25 mg total) by mouth every 6 (six) hours as needed for anxiety. 30 tablet 0  . Liraglutide (VICTOZA) 18 MG/3ML SOPN 1.8 mg sq qd 6 mL 0  . metFORMIN (GLUCOPHAGE) 1000 MG tablet Take 1 tablet (1,000 mg total) by mouth 2 (two) times daily with a meal. 60 tablet 2  . metoprolol succinate (TOPROL-XL) 100 MG 24 hr tablet Take 1 tablet (100 mg total) by mouth daily. Take with or immediately following a meal. 30 tablet 0  . naproxen (NAPROSYN) 375 MG tablet Take 1 tablet (375 mg total) by mouth 2 (two) times daily. (Patient taking differently: Take 375 mg by mouth 2 (two) times daily as needed for mild pain or moderate pain. ) 20 tablet 0  . nortriptyline (PAMELOR) 10 MG capsule Take 2 capsules (20 mg total) by mouth at bedtime. 60 capsule 3  . pravastatin (PRAVACHOL) 10 MG tablet Take 1 tablet (10 mg total) by mouth daily. Reported on 12/09/2015 30 tablet 0  . traMADol (ULTRAM) 50  MG tablet Take 1 tablet (50 mg total) by mouth every 8 (eight) hours as needed. 30 tablet 0  . valsartan (DIOVAN) 160 MG tablet Take 1 tablet (160 mg total) by mouth daily. 30 tablet 2   No current facility-administered medications on file prior to visit.     Allergies: No Known Allergies  Review of Systems:  CONSTITUTIONAL: No fevers, chills, night sweats, or weight loss.  EYES: No visual changes or eye pain ENT: No hearing changes.  No history of nose bleeds.   RESPIRATORY: No cough, wheezing and shortness of breath.   CARDIOVASCULAR: Negative for chest pain, and palpitations.   GI: Negative for abdominal discomfort, blood in stools  or black stools.  No recent change in bowel habits.   GU:  No history of incontinence.   MUSCLOSKELETAL: No history of joint pain or swelling.  No myalgias.   SKIN: Negative for lesions, rash, and itching.   ENDOCRINE: Negative for cold or heat intolerance, polydipsia or goiter.   PSYCH:  + depression + anxiety symptoms.   NEURO: As Above.   Vital Signs:  BP 140/90   Pulse 72   Ht 5\' 11"  (1.803 m)   Wt (!) 336 lb 9 oz (152.7 kg)   SpO2 96%   BMI 46.94 kg/m   Neurological Exam: MENTAL STATUS including orientation to time, place, person, recent and remote memory, attention span and concentration, language, and fund of knowledge is normal.  Speech is not dysarthric.  CRANIAL NERVES: Pupils equal round and reactive to light.  Normal conjugate, extra-ocular eye movements in all directions of gaze.  No ptosis. Normal facial sensation.  Face is symmetric. Palate elevates symmetrically.  Tongue is midline.  MOTOR:  Motor strength is 5/5 in all extremities.  No atrophy, fasciculations or abnormal movements.  No pronator drift.  Tone is normal.    MSRs:  Reflexes are 2+/4 throughout.  SENSORY:  Reduced vibration at the right toe and absent left toe. Temperature is reduced following a gradient pattern in the left lower extremity.    COORDINATION/GAIT:    Gait narrow based and stable.   Data: NCS/EMG of the legs 11/22/2015:  The electrophysiologic findings are most consistent with a distal and symmetric sensorimotor polyneuropathy, axon loss in type, affecting the lower extremities.   Lab Results  Component Value Date   HGBA1C 7.3 (H) 04/25/2016   Labs 11/07/2015:  Copper 78, vitamin B12 244, TSH 2.8   IMPRESSION/PLAN: 1.  Distal and symmetric diabetic polyneuropathy affecting the feet, controlled on nortriptyline   - NCS/EMG shows chronic axonal sensorimotor polyneuropathy consistent with his exam  - Labs show low-normal vitamin B12 (244) and elevated HbA1c 7.8 - he is working with his  PCP to better control sugars and has been taking vitamin B12 1029mcg daily  - Continue nortriptyline 20 qhs, up titrate if symptoms become worse  2.  Low vitamin B12  - Continue vitamin B12 1065mcg  3.  Praised him for tobacco cessation (quit in June)  - Smoking cessation instruction/counseling given:  commended patient for quitting and reviewed strategies for preventing relapses  4.  Morbid obesity, weight loss strategies and healthy diet choices discussed  Return to clinic as needed   The duration of this appointment visit was 25 minutes of face-to-face time with the patient.  Greater than 50% of this time was spent in counseling, explanation of diagnosis, planning of further management, and coordination of care.   Thank you for allowing me to participate in  patient's care.  If I can answer any additional questions, I would be pleased to do so.    Sincerely,    Dasiah Hooley K. Posey Pronto, DO

## 2016-05-08 ENCOUNTER — Ambulatory Visit (INDEPENDENT_AMBULATORY_CARE_PROVIDER_SITE_OTHER): Payer: BLUE CROSS/BLUE SHIELD | Admitting: Neurology

## 2016-05-08 ENCOUNTER — Encounter: Payer: Self-pay | Admitting: Neurology

## 2016-05-08 VITALS — BP 140/90 | HR 72 | Ht 71.0 in | Wt 336.6 lb

## 2016-05-08 DIAGNOSIS — E0842 Diabetes mellitus due to underlying condition with diabetic polyneuropathy: Secondary | ICD-10-CM | POA: Insufficient documentation

## 2016-05-08 NOTE — Patient Instructions (Signed)
Great work on quitting smoking! Continue nortriptyline 20mg  at bedtime Start an exercise program and encouraged to make healthier diet changes

## 2016-05-21 ENCOUNTER — Encounter: Payer: Self-pay | Admitting: Family Medicine

## 2016-05-21 MED ORDER — TRAMADOL HCL 50 MG PO TABS: 50.0000 mg | ORAL_TABLET | Freq: Three times a day (TID) | ORAL | 0 refills | Status: DC | PRN

## 2016-05-21 NOTE — Telephone Encounter (Signed)
Refill x1 

## 2016-05-21 NOTE — Telephone Encounter (Signed)
Last seen 04/24/16 and filled 04/26/16 #30  Please advise     KP

## 2016-05-25 NOTE — Progress Notes (Signed)
HPI: 55 year old male for evaluation of chest pain. Cardiac cath 2013 showed minor irregularities and normal LV function. Lower extremity venous Dopplers April 2017 showed no DVT. Echocardiogram April 2017 showed normal LV systolic function, grade 1 diastolic dysfunction and mild left atrial enlargement. Nuclear study September 2017 showed ejection fraction 53%. No ischemia. Laboratories September 2017 showed normal troponin and hemoglobin 14.4. LDL 117. Patient has had intermittent chest pain for years associated with anxiety attacks. It is in the left upper chest and shoulder. He does not have any other time. There is no exertional chest pain. He has mild dyspnea on exertion but no orthopnea or PND. Occasional mild pedal edema.   Current Outpatient Prescriptions  Medication Sig Dispense Refill  . amLODipine (NORVASC) 10 MG tablet Take 1 tablet (10 mg total) by mouth daily. 30 tablet 0  . escitalopram (LEXAPRO) 20 MG tablet Take 1 tablet (20 mg total) by mouth daily. 30 tablet 2  . furosemide (LASIX) 40 MG tablet Take 1 tablet (40 mg total) by mouth 2 (two) times daily. 60 tablet 0  . glipiZIDE (GLUCOTROL) 10 MG tablet Take 1 tablet (10 mg total) by mouth 2 (two) times daily before a meal. 60 tablet 2  . Liraglutide (VICTOZA) 18 MG/3ML SOPN 1.8 mg sq qd 6 mL 0  . metFORMIN (GLUCOPHAGE) 1000 MG tablet Take 1 tablet (1,000 mg total) by mouth 2 (two) times daily with a meal. 60 tablet 2  . metoprolol succinate (TOPROL-XL) 100 MG 24 hr tablet Take 1 tablet (100 mg total) by mouth daily. Take with or immediately following a meal. 30 tablet 0  . naproxen (NAPROSYN) 375 MG tablet Take 1 tablet (375 mg total) by mouth 2 (two) times daily. (Patient taking differently: Take 375 mg by mouth 2 (two) times daily as needed for mild pain or moderate pain. ) 20 tablet 0  . nortriptyline (PAMELOR) 10 MG capsule Take 2 capsules (20 mg total) by mouth at bedtime. 60 capsule 3  . sertraline (ZOLOFT) 50 MG tablet  Take 50 mg by mouth daily.   0  . traMADol (ULTRAM) 50 MG tablet Take 1 tablet (50 mg total) by mouth every 8 (eight) hours as needed. 30 tablet 0   No current facility-administered medications for this visit.     No Known Allergies   Past Medical History:  Diagnosis Date  . CAD (coronary artery disease)  cardiac cath with moderate disease in a septal branch of the ramus intermedius 04/01/2012  . Depression   . Diabetes mellitus    poorly controlled by his report  . History of narcotic addiction ([place])    past history of back pain  . Hypercholesteremia   . Hypertension   . IBS (irritable bowel syndrome)   . Obesity    Max weight was 390  . OSA on CPAP   . Panic attacks   . Testosterone deficiency     Past Surgical History:  Procedure Laterality Date  . CARDIAC CATHETERIZATION    . LEFT HEART CATHETERIZATION WITH CORONARY ANGIOGRAM N/A 03/31/2012   Procedure: LEFT HEART CATHETERIZATION WITH CORONARY ANGIOGRAM;  Surgeon: Leonie Man, MD;  Location: [facility] CATH LAB;  Service: Cardiovascular;  Laterality: N/A;    Social History   Social History  . Marital status: Divorced    Spouse name: N/A  . Number of children: 2  . Years of education: N/A   Occupational History  . Not on file.   Social History Main Topics  .  Smoking status: Former Smoker    Quit date: 11/21/2015  . Smokeless tobacco: Never Used  . Alcohol use No  . Drug use: No  . Sexual activity: Not Currently   Other Topics Concern  . Not on file   Social History Narrative   Lives with mother in a one story home.  Has 2 grown children.     Works as a Barrister's clerk.  Education: Oceanographer.    Family History  Problem Relation Age of Onset  . Diabetes type II Father   . Hypertension Father   . Pancreatic disease Father 71    Deceased  . Healthy Mother   . Healthy Sister   . Healthy Son   . Healthy Daughter     ROS: Arthralgias and anxiety attacks but no fevers or chills, productive cough, hemoptysis,  dysphasia, odynophagia, melena, hematochezia, dysuria, hematuria, rash, seizure activity, orthopnea, PND, pedal edema, claudication. Remaining systems are negative.  Physical Exam:   Blood pressure 138/88, pulse 71, height 5\' 11"  (1.803 m), weight (!) 341 lb (154.7 kg).  General:  Well developed/obese in NAD Skin warm/dry Patient not depressed No peripheral clubbing Back-normal HEENT-normal/normal eyelids Neck supple/normal carotid upstroke bilaterally; no bruits; no JVD; no thyromegaly chest - CTA/ normal expansion CV - RRR/normal S1 and S2; no murmurs, rubs or gallops;  PMI nondisplaced Abdomen -NT/ND, no HSM, no mass, + bowel sounds, no bruit 2+ femoral pulses, no bruits Ext-no edema, chords, 2+ DP Neuro-grossly nonfocal  ECG - 04/24/2016-sinus rhythm with no ST changes.  A/P  1 chest pain-symptoms are atypical. Recent nuclear study negative. Will not pursue further ischemia evaluation.   2 obesity-patient counseled on weight loss and exercise.  3 hyperlipidemia-his LDL is elevated and he has diabetes mellitus. He would benefit from a statin long-term. He is having some myalgias and when this improves a statin would be advisable. I've asked him to follow-up with primary care for this.  4 hypertension-blood pressure controlled. Continue present medications.  Kirk Ruths, MD

## 2016-05-30 ENCOUNTER — Encounter: Payer: Self-pay | Admitting: Cardiology

## 2016-05-30 ENCOUNTER — Ambulatory Visit (INDEPENDENT_AMBULATORY_CARE_PROVIDER_SITE_OTHER): Payer: BLUE CROSS/BLUE SHIELD | Admitting: Cardiology

## 2016-05-30 VITALS — BP 138/88 | HR 71 | Ht 71.0 in | Wt 341.0 lb

## 2016-05-30 DIAGNOSIS — E784 Other hyperlipidemia: Secondary | ICD-10-CM | POA: Diagnosis not present

## 2016-05-30 DIAGNOSIS — E7849 Other hyperlipidemia: Secondary | ICD-10-CM

## 2016-05-30 DIAGNOSIS — R072 Precordial pain: Secondary | ICD-10-CM

## 2016-05-30 DIAGNOSIS — I10 Essential (primary) hypertension: Secondary | ICD-10-CM | POA: Diagnosis not present

## 2016-05-30 NOTE — Patient Instructions (Signed)
Your physician recommends that you schedule a follow-up appointment in: AS NEEDED  

## 2016-06-15 ENCOUNTER — Other Ambulatory Visit: Payer: Self-pay | Admitting: Family Medicine

## 2016-06-15 ENCOUNTER — Encounter: Payer: Self-pay | Admitting: Family Medicine

## 2016-06-15 MED ORDER — TRAMADOL HCL 50 MG PO TABS: 50.0000 mg | ORAL_TABLET | Freq: Three times a day (TID) | ORAL | 0 refills | Status: DC | PRN

## 2016-06-15 NOTE — Telephone Encounter (Signed)
Last seen 04/24/2016 and filled 05/21/16 #30   Please advise    KP

## 2016-06-20 NOTE — Telephone Encounter (Signed)
Verified with Timmothy Sours at [facility] that they did not receive 06/15/16 tramadol Rx. Rx given verbally.

## 2016-06-24 ENCOUNTER — Other Ambulatory Visit: Payer: Self-pay | Admitting: Family Medicine

## 2016-06-27 ENCOUNTER — Encounter: Payer: Self-pay | Admitting: Family Medicine

## 2016-07-03 ENCOUNTER — Ambulatory Visit (HOSPITAL_BASED_OUTPATIENT_CLINIC_OR_DEPARTMENT_OTHER)
Admission: RE | Admit: 2016-07-03 | Discharge: 2016-07-03 | Disposition: A | Discharge status: Home / Self Care | Payer: BLUE CROSS/BLUE SHIELD | Source: Ambulatory Visit | Attending: Medical | Admitting: Medical

## 2016-07-03 ENCOUNTER — Ambulatory Visit (INDEPENDENT_AMBULATORY_CARE_PROVIDER_SITE_OTHER): Payer: BLUE CROSS/BLUE SHIELD | Admitting: Medical

## 2016-07-03 ENCOUNTER — Encounter: Payer: Self-pay | Admitting: Medical

## 2016-07-03 VITALS — BP 124/72 | HR 81 | Temp 97.5°F | Ht 71.0 in | Wt 351.2 lb

## 2016-07-03 DIAGNOSIS — M25522 Pain in left elbow: Secondary | ICD-10-CM | POA: Diagnosis not present

## 2016-07-03 DIAGNOSIS — E1151 Type 2 diabetes mellitus with diabetic peripheral angiopathy without gangrene: Secondary | ICD-10-CM | POA: Diagnosis not present

## 2016-07-03 DIAGNOSIS — M2550 Pain in unspecified joint: Secondary | ICD-10-CM

## 2016-07-03 DIAGNOSIS — F411 Generalized anxiety disorder: Secondary | ICD-10-CM

## 2016-07-03 DIAGNOSIS — M25562 Pain in left knee: Secondary | ICD-10-CM

## 2016-07-03 DIAGNOSIS — E1165 Type 2 diabetes mellitus with hyperglycemia: Secondary | ICD-10-CM

## 2016-07-03 DIAGNOSIS — I10 Essential (primary) hypertension: Secondary | ICD-10-CM

## 2016-07-03 DIAGNOSIS — IMO0002 Reserved for concepts with insufficient information to code with codable children: Secondary | ICD-10-CM

## 2016-07-03 DIAGNOSIS — R600 Localized edema: Secondary | ICD-10-CM

## 2016-07-03 LAB — URIC ACID: Uric Acid, Serum: 6 mg/dL (ref 4.0–7.8)

## 2016-07-03 LAB — C-REACTIVE PROTEIN: CRP: 0.6 mg/dL (ref 0.5–20.0)

## 2016-07-03 LAB — SEDIMENTATION RATE: Sed Rate: 5 mm/hr (ref 0–20)

## 2016-07-03 IMAGING — DX DG ELBOW COMPLETE 3+V*L*
4 series · 4 of 4 positions shown · non-contrast
Comparison: None

CLINICAL DATA: Medial LEFT elbow pain for several months

EXAM:
LEFT ELBOW - COMPLETE 3+ VIEW

[elbow ap]
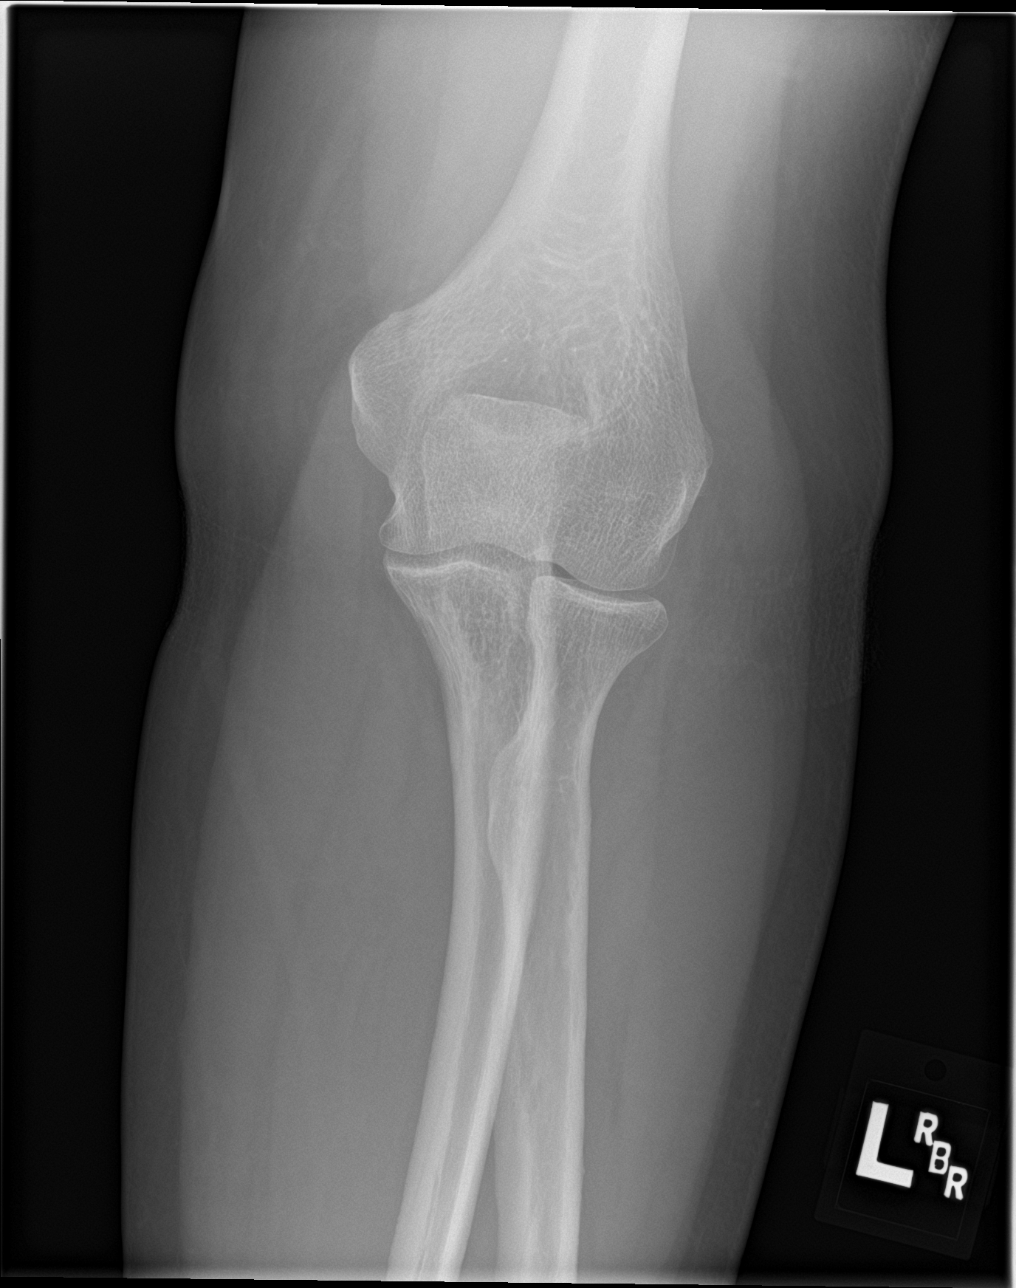

[elbow obl (1 of 2)]
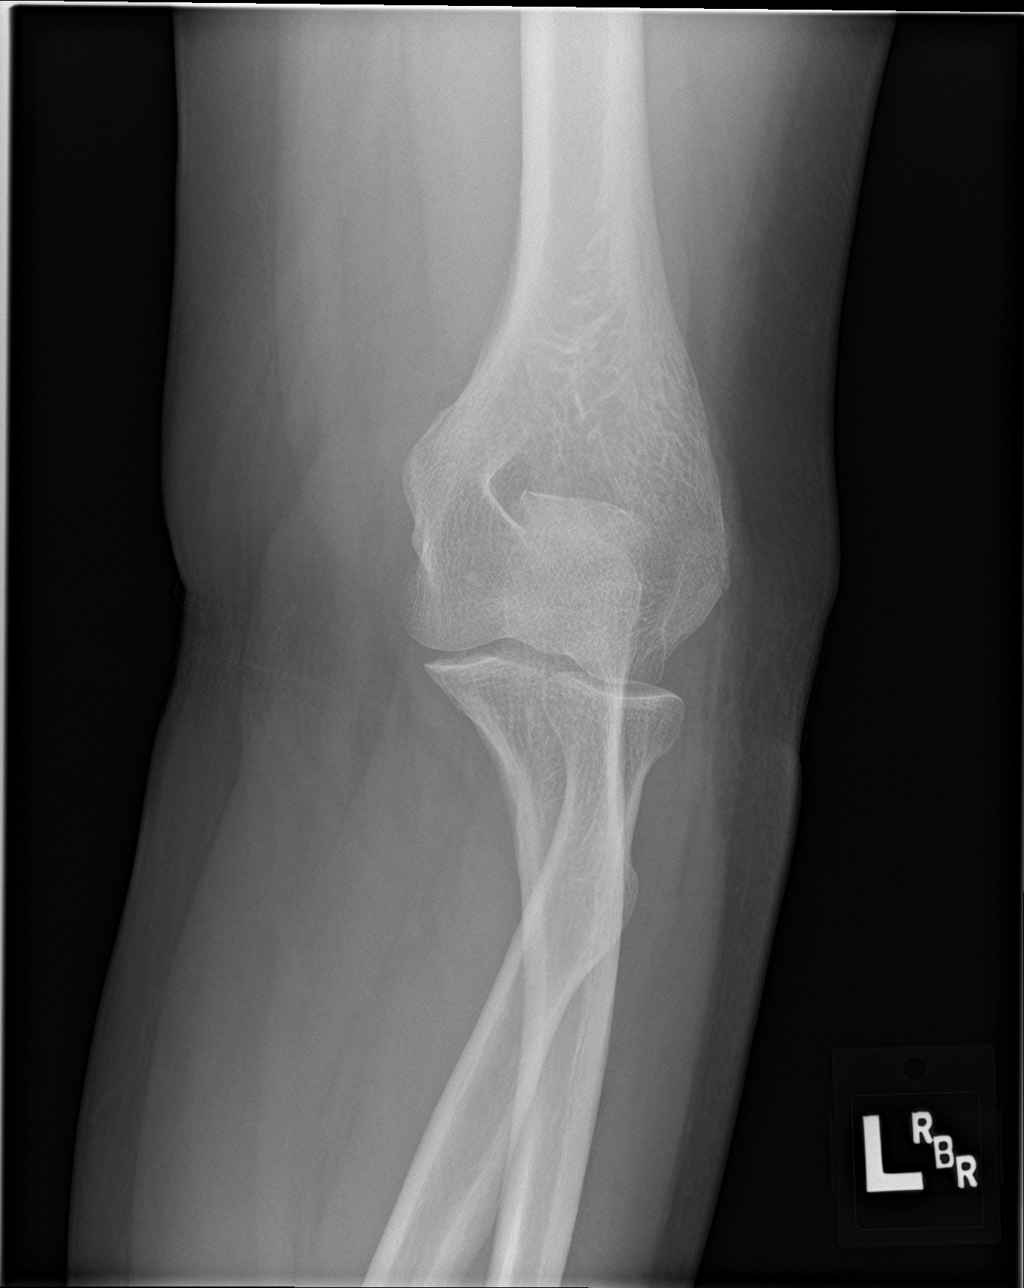

[elbow obl (2 of 2)]
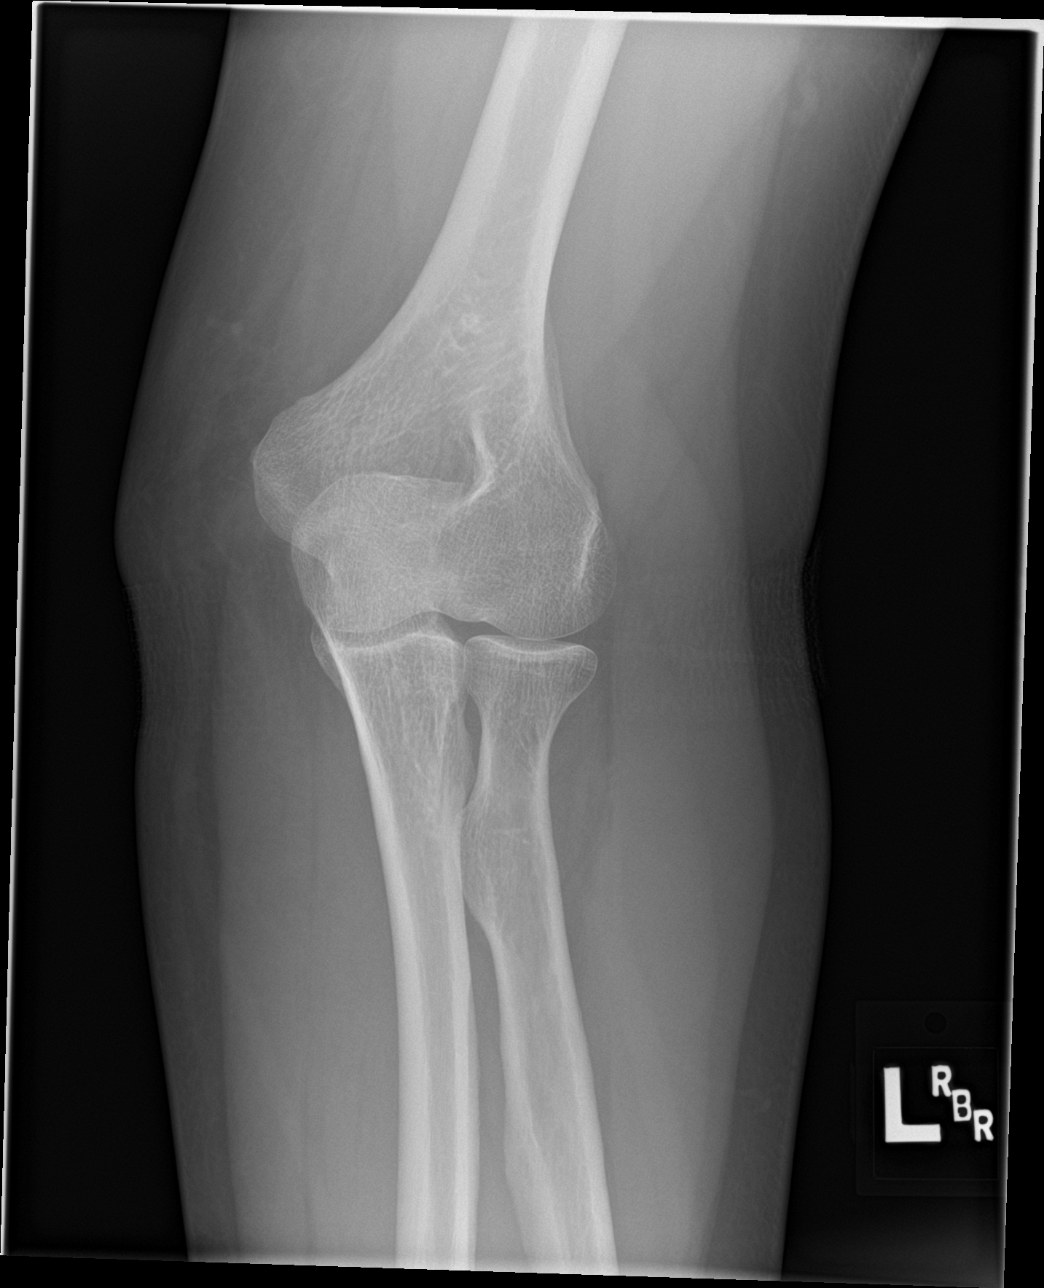

[elbow lat]
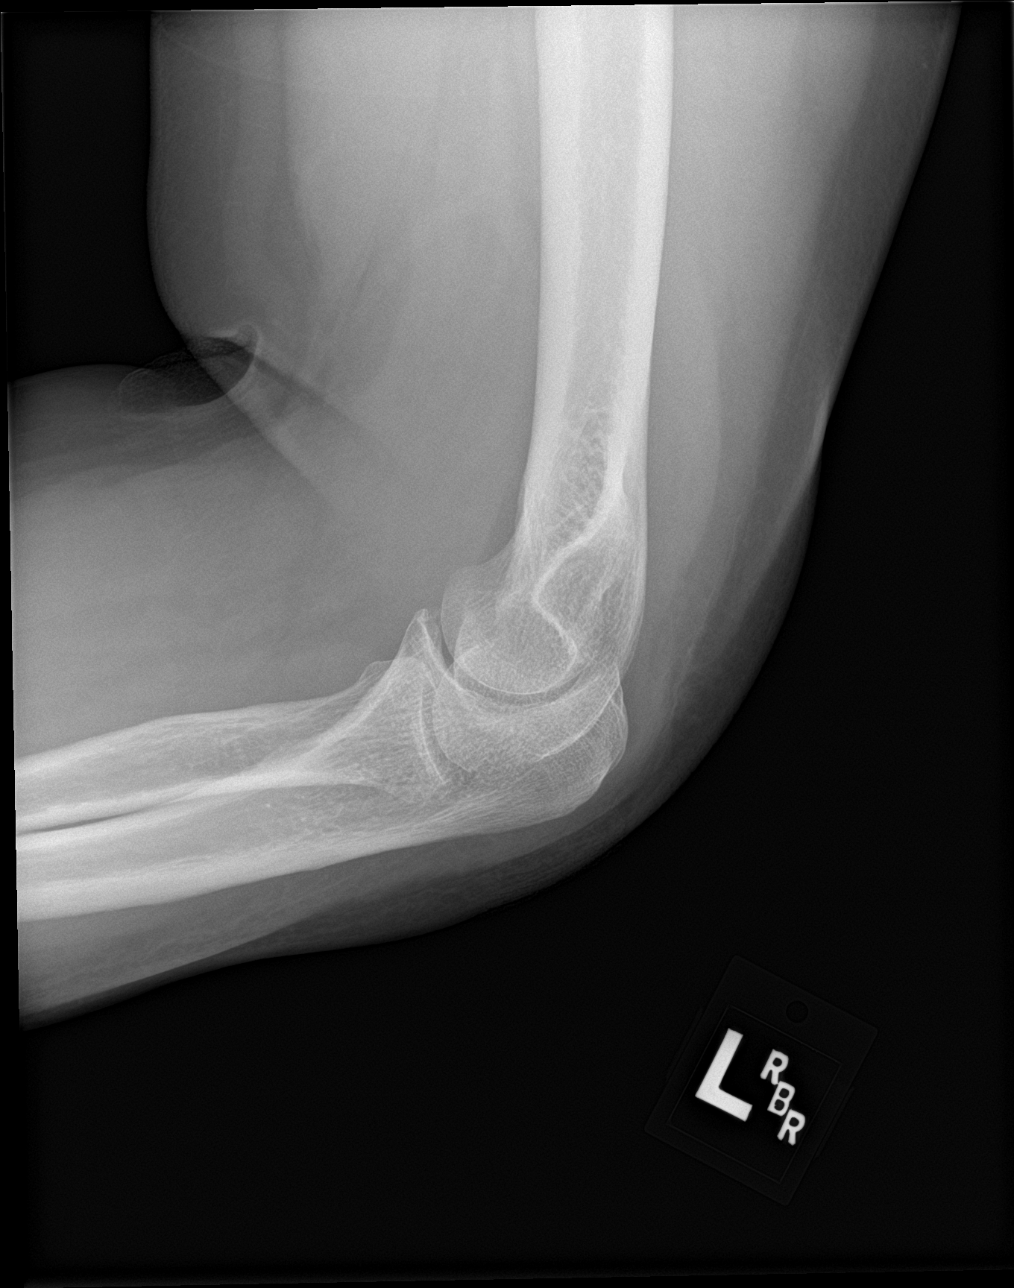

[4 of 4 positions shown; findings below may reference images not displayed]

FINDINGS: Osseous mineralization grossly normal.

Joint spaces preserved.

No acute fracture, dislocation, or bone destruction.

No elbow joint effusion.
IMPRESSION: Normal exam.

## 2016-07-03 IMAGING — DX DG KNEE 3 VIEWS*L*
3 series · 3 of 3 positions shown · non-contrast
Comparison: None

CLINICAL DATA: Medial LEFT knee pain for several months

EXAM:
LEFT KNEE - 3 VIEW

[knee ap]
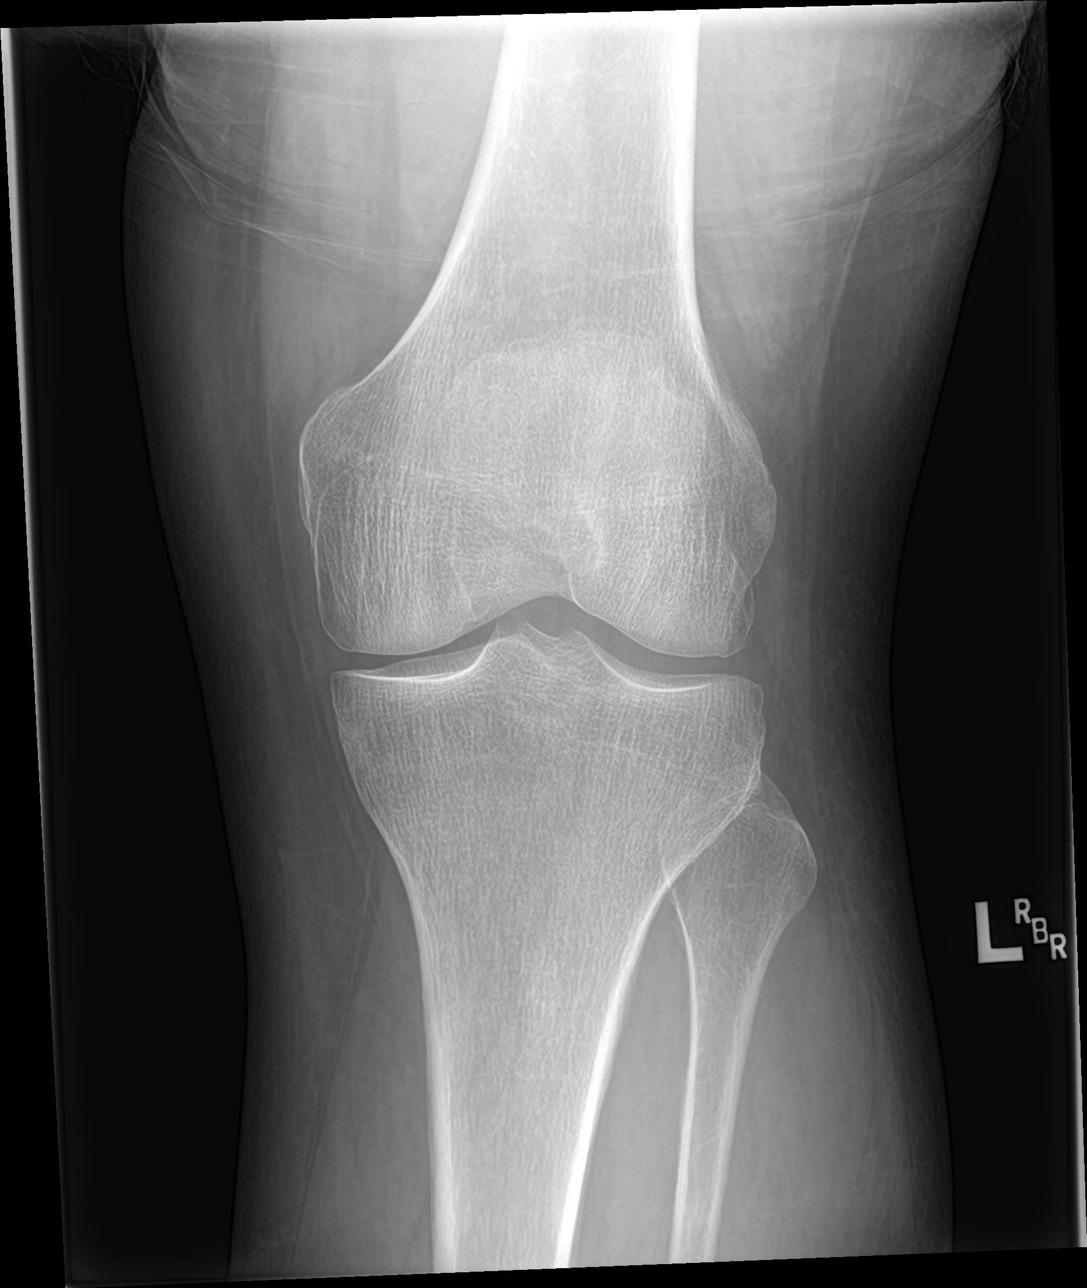

[knee lat]
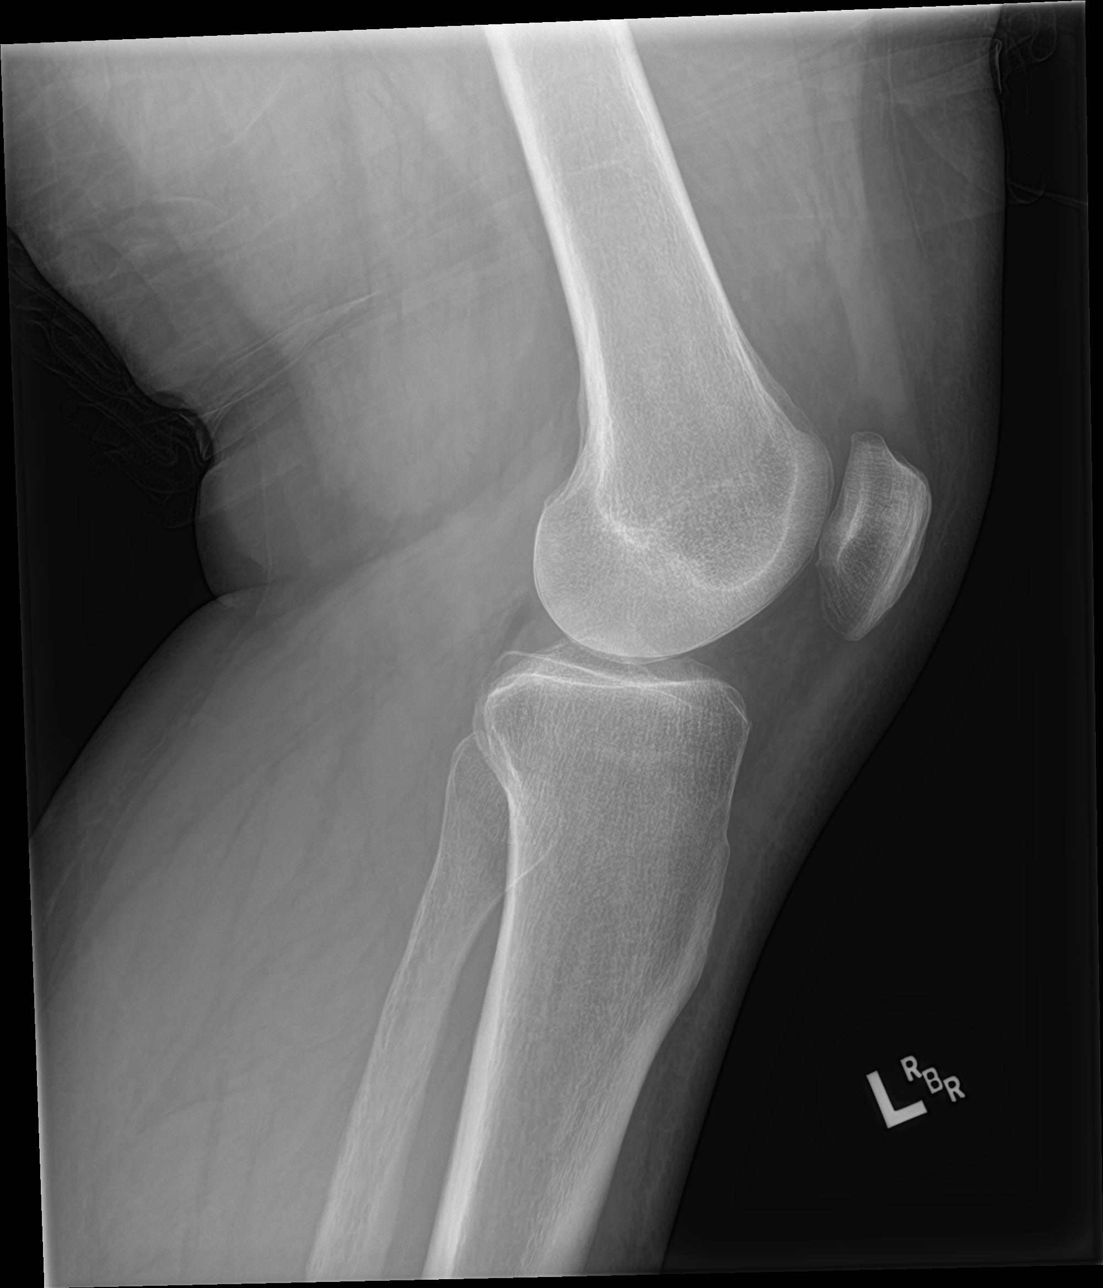

[knee sunrise]
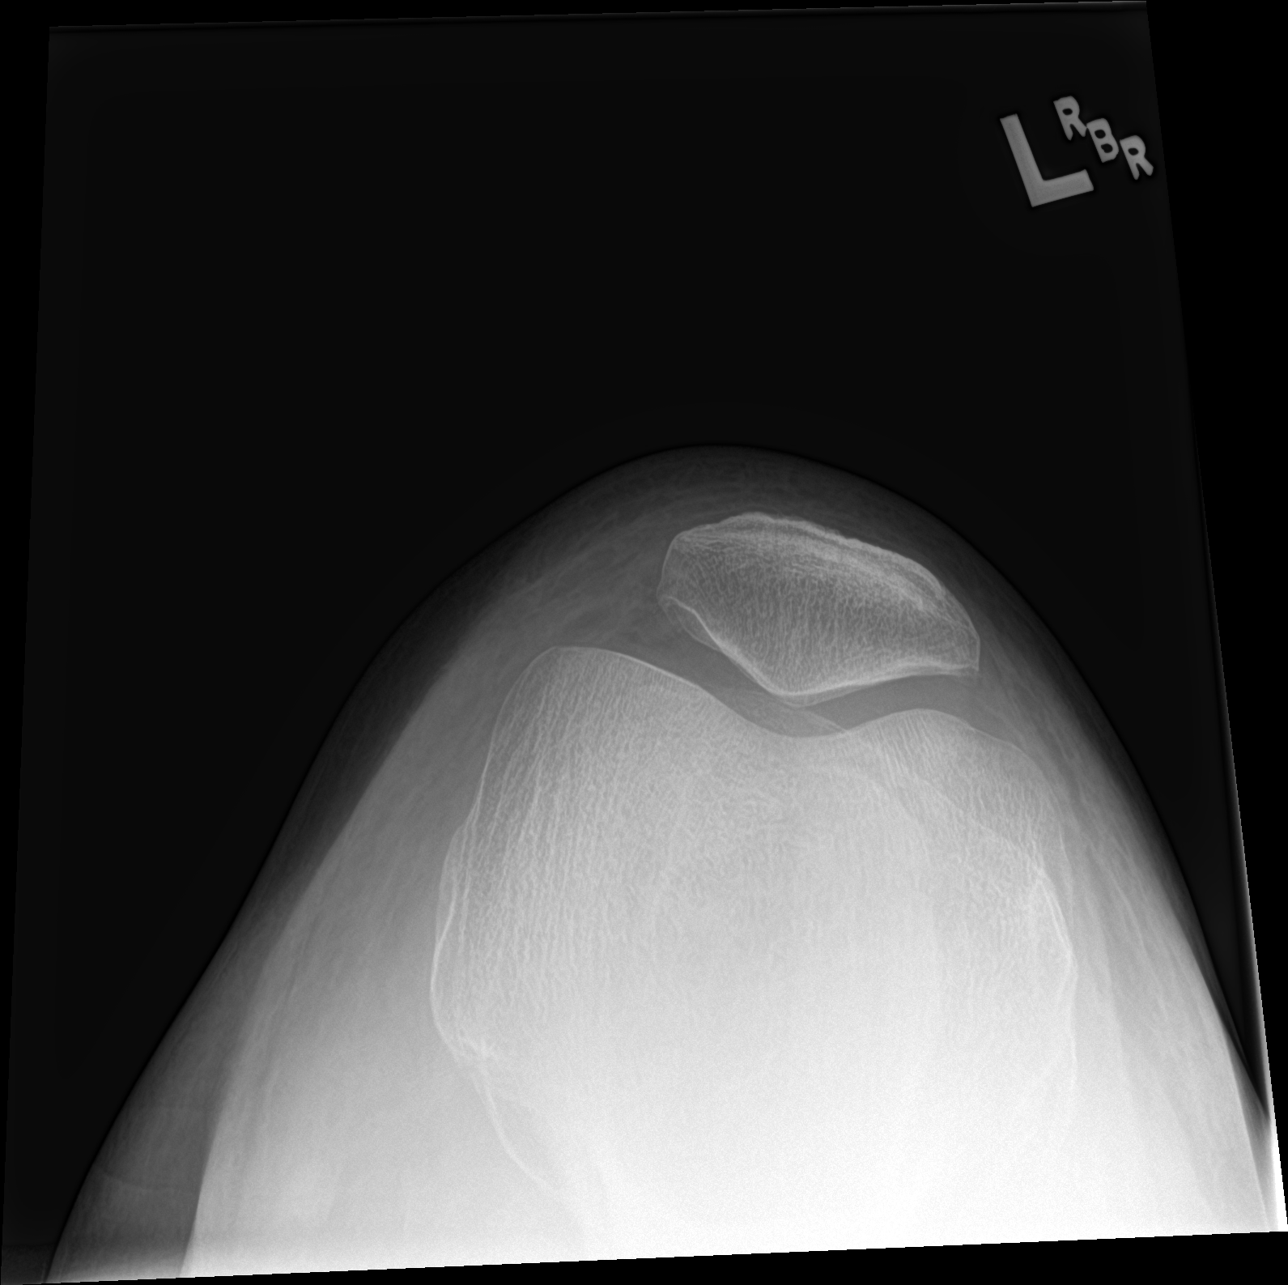

[3 of 3 positions shown; findings below may reference images not displayed]

FINDINGS: Bone mineralization normal.

Joint spaces preserved.

No fracture, dislocation, or bone destruction.

No joint effusion.
IMPRESSION: No acute abnormalities.

## 2016-07-03 MED ORDER — NORTRIPTYLINE HCL 10 MG PO CAPS: 20.0000 mg | ORAL_CAPSULE | Freq: Every day | ORAL | 3 refills | Status: DC

## 2016-07-03 MED ORDER — ESCITALOPRAM OXALATE 20 MG PO TABS: 20.0000 mg | ORAL_TABLET | Freq: Every day | ORAL | 2 refills | Status: DC

## 2016-07-03 MED ORDER — AMLODIPINE BESYLATE 10 MG PO TABS: 10.0000 mg | ORAL_TABLET | Freq: Every day | ORAL | 3 refills | Status: DC

## 2016-07-03 MED ORDER — KETOROLAC TROMETHAMINE 60 MG/2ML IM SOLN
60.0000 mg | Freq: Once | INTRAMUSCULAR | Status: AC
  Administered 2016-07-03: 60 mg via INTRAMUSCULAR

## 2016-07-03 MED ORDER — DICLOFENAC SODIUM 75 MG PO TBEC: 75.0000 mg | DELAYED_RELEASE_TABLET | Freq: Two times a day (BID) | ORAL | 0 refills | Status: DC

## 2016-07-03 MED ORDER — FUROSEMIDE 40 MG PO TABS: 40.0000 mg | ORAL_TABLET | Freq: Two times a day (BID) | ORAL | 2 refills | Status: DC

## 2016-07-03 MED ORDER — METFORMIN HCL 1000 MG PO TABS: 1000.0000 mg | ORAL_TABLET | Freq: Two times a day (BID) | ORAL | 2 refills | Status: DC

## 2016-07-03 MED ORDER — GLIPIZIDE 10 MG PO TABS: 10.0000 mg | ORAL_TABLET | Freq: Two times a day (BID) | ORAL | 2 refills | Status: DC

## 2016-07-03 MED ORDER — METOPROLOL SUCCINATE ER 100 MG PO TB24
100.0000 mg | ORAL_TABLET | Freq: Every day | ORAL | 2 refills | Status: DC
Start: 2016-07-03 — End: 2016-09-25

## 2016-07-03 NOTE — Progress Notes (Signed)
Pre visit review using our clinic review tool, if applicable. No additional management support is needed unless otherwise documented below in the visit note. 

## 2016-07-03 NOTE — Patient Instructions (Addendum)
For your joint pains will give toradol 60 mg im today.  Start diclofenac tomorrow.   Can continue tramadol.  Will do inflammatory studies and RA panel today. May refer to rheumatologist even if studies negative if pain persists.  Follow up in 2 weeks or as needed    Refill meds today. Serotonin syndrome hand out given.  Serotonin Syndrome Serotonin is a brain chemical that regulates the nervous system, which includes the brain, spinal cord, and nerves. Serotonin appears to play a role in all types of behavior, including appetite, emotions, movement, thinking, and response to stress. Excessively high levels of serotonin in the body can cause serotonin syndrome, which is a very dangerous condition. What are the causes? This condition can be caused by taking medicines or drugs that increase the level of serotonin in your body. These include:  Antidepressant medicines.  Migraine medicines.  Certain pain medicines.  Certain recreational drugs, including ecstasy, LSD, cocaine, and amphetamines.  Over-the-counter cough or cold medicines that contain dextromethorphan.  Certain herbal supplements, including St. John's wort, ginseng, and nutmeg. This condition usually occurs when you take these medicines or drugs in combination, but it can also happen with a high dose of a single medicine or drug. What increases the risk? This condition is more likely to develop in:  People who have recently increased the dosage of medicine that increases the serotonin level.  People who just started taking medicine that increases the serotonin level. What are the signs or symptoms? Symptoms of this condition usually happens within several hours of a medicine change. Symptoms include:  Headache.  Muscle twitching or stiffness.  Diarrhea.  Confusion.  Restlessness or agitation.  Shivering or goose bumps.  Loss of muscle coordination.  Rapid heart rate.  Sweating. Severe cases of  serotonin syndromecan cause:  Irregular heartbeat.  Seizures.  Loss of consciousness.  High fever. How is this diagnosed? This condition is diagnosed with a medical history and physical exam. You will be asked aboutyour symptoms and your use of medicines and recreational drugs. Your health care provider may also order lab work or additional tests to rule out other causes of your symptoms. How is this treated? The treatment for this condition depends on the severity of your symptoms. For mild cases, stopping the medicine that caused your condition is usually all that is needed. For moderate to severe cases, hospitalization is required to monitor you and to prevent further muscle damage. Follow these instructions at home:  Take over-the-counter and prescription medicines only as told by your health care provider. This is important.  Check with your health care provider before you start taking any new prescriptions, over-the-counter medicines, herbs, or supplements.  Avoid combining any medicines that can cause this condition to occur.  Keep all follow-up visits as told by your health care provider.This is important.  Maintain a healthy lifestyle.  Eat healthy foods.  Get plenty of sleep.  Exercise regularly.  Do not drink alcohol.  Do not use recreational drugs. Contact a health care provider if:  Medicines do not seem to be helping.  Your symptoms do not improve or they get worse.  You have trouble taking care of yourself. Get help right away if:  You have worsening confusion, severe headache, chest pain, high fever, seizures, or loss of consciousness.  You have serious thoughts about hurting yourself or others.  You experience serious side effects of medicine, such as swelling of your face, lips, tongue, or throat. This information is not  intended to replace advice given to you by your health care provider. Make sure you discuss any questions you have with your  health care provider. Document Released: 09/13/2004 Document Revised: 03/31/2016 Document Reviewed: 08/19/2014 Elsevier Interactive Patient Education  2017 [employer].

## 2016-07-03 NOTE — Progress Notes (Signed)
Subjective:    Patient ID: Tyler Brewer, male    DOB: 05/26/1961, 55 y.o.   MRN: [id number]  HPI  Pt in with some recent joint pain. He states acute onset. States both elbows, shoulders, knees and hip pain. Pt states joints burn for a couple of months.   Pt thought maybe thought maybe pain related to lower extremity neuropathy. But does not describe direct correlation.  Pt has tramadol. He in not on any alleve per his report.  Pt thought maybe side of effect of valsartan. He started that about 2 months ago. He only speculated.(Discussed with pt pt unaware of arthralgias side effect of valsartan.)     Review of Systems  Constitutional: Negative for chills, fatigue and fever.  HENT: Negative for congestion, ear pain, nosebleeds, postnasal drip and sinus pain.   Respiratory: Negative for cough, choking, chest tightness, shortness of breath and wheezing.   Cardiovascular: Negative for chest pain and palpitations.  Gastrointestinal: Negative for abdominal distention, abdominal pain, blood in stool, constipation, diarrhea, nausea and vomiting.  Musculoskeletal: Positive for arthralgias. Negative for back pain.       Some thigh muscle weakness at times.  Skin: Negative for rash.  Neurological: Negative for dizziness, syncope, speech difficulty, weakness, light-headedness and numbness.  Hematological: Negative for adenopathy. Does not bruise/bleed easily.  Psychiatric/Behavioral: Negative for agitation, behavioral problems, dysphoric mood, sleep disturbance and suicidal ideas. The patient is nervous/anxious.     Past Medical History:  Diagnosis Date  . CAD (coronary artery disease)  cardiac cath with moderate disease in a septal branch of the ramus intermedius 04/01/2012  . Depression   . Diabetes mellitus    poorly controlled by his report  . History of narcotic addiction ([place])    past history of back pain  . Hypercholesteremia   . Hypertension   . IBS (irritable bowel  syndrome)   . Obesity    Max weight was 390  . OSA on CPAP   . Panic attacks   . Testosterone deficiency      Social History   Social History  . Marital status: Divorced    Spouse name: N/A  . Number of children: 2  . Years of education: N/A   Occupational History  . Not on file.   Social History Main Topics  . Smoking status: Former Smoker    Quit date: 11/21/2015  . Smokeless tobacco: Never Used  . Alcohol use No  . Drug use: No  . Sexual activity: Not Currently   Other Topics Concern  . Not on file   Social History Narrative   Lives with mother in a one story home.  Has 2 grown children.     Works as a Barrister's clerk.  Education: Oceanographer.    Past Surgical History:  Procedure Laterality Date  . CARDIAC CATHETERIZATION    . LEFT HEART CATHETERIZATION WITH CORONARY ANGIOGRAM N/A 03/31/2012   Procedure: LEFT HEART CATHETERIZATION WITH CORONARY ANGIOGRAM;  Surgeon: Leonie Man, MD;  Location: [facility] CATH LAB;  Service: Cardiovascular;  Laterality: N/A;    Family History  Problem Relation Age of Onset  . Diabetes type II Father   . Hypertension Father   . Pancreatic disease Father 2    Deceased  . Healthy Mother   . Healthy Sister   . Healthy Son   . Healthy Daughter     No Known Allergies  Current Outpatient Prescriptions on File Prior to Visit  Medication Sig Dispense Refill  .  amLODipine (NORVASC) 10 MG tablet Take 1 tablet (10 mg total) by mouth daily. 30 tablet 0  . B-D ULTRAFINE III SHORT PEN 31G X 8 MM MISC USE WITH VICTOZA AS INSTRUCTED 100 each 0  . escitalopram (LEXAPRO) 20 MG tablet Take 1 tablet (20 mg total) by mouth daily. 30 tablet 2  . furosemide (LASIX) 40 MG tablet Take 1 tablet (40 mg total) by mouth 2 (two) times daily. 60 tablet 0  . glipiZIDE (GLUCOTROL) 10 MG tablet Take 1 tablet (10 mg total) by mouth 2 (two) times daily before a meal. 60 tablet 2  . Liraglutide (VICTOZA) 18 MG/3ML SOPN 1.8 mg sq qd 6 mL 0  . metFORMIN (GLUCOPHAGE) 1000  MG tablet Take 1 tablet (1,000 mg total) by mouth 2 (two) times daily with a meal. 60 tablet 2  . metoprolol succinate (TOPROL-XL) 100 MG 24 hr tablet Take 1 tablet (100 mg total) by mouth daily. Take with or immediately following a meal. 30 tablet 0  . naproxen (NAPROSYN) 375 MG tablet Take 1 tablet (375 mg total) by mouth 2 (two) times daily. (Patient taking differently: Take 375 mg by mouth 2 (two) times daily as needed for mild pain or moderate pain. ) 20 tablet 0  . nortriptyline (PAMELOR) 10 MG capsule Take 2 capsules (20 mg total) by mouth at bedtime. 60 capsule 3  . sertraline (ZOLOFT) 50 MG tablet Take 50 mg by mouth daily.   0  . traMADol (ULTRAM) 50 MG tablet Take 1 tablet (50 mg total) by mouth every 8 (eight) hours as needed. 30 tablet 0   No current facility-administered medications on file prior to visit.     BP 124/72 (BP Location: Left Arm, Patient Position: Sitting, Cuff Size: Large)   Pulse 81   Temp 97.5 F (36.4 C) (Oral)   Ht 5\' 11"  (1.803 m)   Wt (!) 351 lb 3.2 oz (159.3 kg)   SpO2 98%   BMI 48.98 kg/m       Objective:   Physical Exam  General- No acute distress. Pleasant patient. Neck- Full range of motion, no jvd Lungs- Clear, even and unlabored. Heart- regular rate and rhythm. Neurologic- CNII- XII grossly intact.  Left elbow- pain on rom. No warmth on palpation.  Left knee- pain on flexion and extension. Faint crepitus. Rt elbow, wrists, shoulder, hips- pain on ROM. But no warmth or swelling on palpation.        Assessment & Plan:  For your joint pains will give toradol 60 mg im today.  Start diclofenac tomorrow.   Can continue tramadol.  Will do inflammatory studies and RA panel today. May refer to rheumatologist even if studies negative if pain persists.  Follow up in 2 weeks or as needed  Refilled medications at pt request.(routine meds at pt request) Zylie Mumaw, Percell Miller, PA-C

## 2016-07-04 ENCOUNTER — Encounter: Payer: Self-pay | Admitting: Medical

## 2016-07-04 LAB — ANA: Anti Nuclear Antibody(ANA): NEGATIVE

## 2016-07-04 LAB — RHEUMATOID FACTOR: Rhuematoid fact SerPl-aCnc: <14 IU/mL (ref <14)

## 2016-07-09 ENCOUNTER — Encounter: Payer: Self-pay | Admitting: Medical

## 2016-07-09 DIAGNOSIS — M2550 Pain in unspecified joint: Secondary | ICD-10-CM

## 2016-07-16 ENCOUNTER — Other Ambulatory Visit: Payer: Self-pay | Admitting: Family Medicine

## 2016-07-16 ENCOUNTER — Other Ambulatory Visit: Payer: Self-pay | Admitting: Medical

## 2016-07-16 DIAGNOSIS — E1151 Type 2 diabetes mellitus with diabetic peripheral angiopathy without gangrene: Secondary | ICD-10-CM

## 2016-07-16 DIAGNOSIS — IMO0002 Reserved for concepts with insufficient information to code with codable children: Secondary | ICD-10-CM

## 2016-07-16 DIAGNOSIS — E1165 Type 2 diabetes mellitus with hyperglycemia: Principal | ICD-10-CM

## 2016-07-17 ENCOUNTER — Encounter: Payer: Self-pay | Admitting: Family Medicine

## 2016-07-17 ENCOUNTER — Ambulatory Visit: Payer: BLUE CROSS/BLUE SHIELD | Admitting: Family Medicine

## 2016-07-17 MED ORDER — LIRAGLUTIDE 18 MG/3ML ~~LOC~~ SOPN: PEN_INJECTOR | SUBCUTANEOUS | 0 refills | Status: DC

## 2016-07-17 MED ORDER — TRAMADOL HCL 50 MG PO TABS: 50.0000 mg | ORAL_TABLET | Freq: Three times a day (TID) | ORAL | 0 refills | Status: DC | PRN

## 2016-07-17 NOTE — Telephone Encounter (Signed)
Last seen 07/03/16 Last filled 06/15/16 #30-0 rf Sig take 1 tab po q8hr prn  Patient requesting sig: 1 tab po bid   Please advise  PC

## 2016-07-19 ENCOUNTER — Other Ambulatory Visit: Payer: Self-pay

## 2016-07-19 ENCOUNTER — Other Ambulatory Visit: Payer: Self-pay | Admitting: Family Medicine

## 2016-07-19 ENCOUNTER — Encounter: Payer: Self-pay | Admitting: Medical

## 2016-07-19 DIAGNOSIS — IMO0002 Reserved for concepts with insufficient information to code with codable children: Secondary | ICD-10-CM

## 2016-07-19 DIAGNOSIS — E1165 Type 2 diabetes mellitus with hyperglycemia: Principal | ICD-10-CM

## 2016-07-19 DIAGNOSIS — E1151 Type 2 diabetes mellitus with diabetic peripheral angiopathy without gangrene: Secondary | ICD-10-CM

## 2016-08-15 ENCOUNTER — Encounter: Payer: Self-pay | Admitting: Medical

## 2016-08-15 MED ORDER — DICLOFENAC SODIUM 75 MG PO TBEC: 75.0000 mg | DELAYED_RELEASE_TABLET | Freq: Two times a day (BID) | ORAL | 0 refills | Status: DC

## 2016-08-15 NOTE — Telephone Encounter (Signed)
rx diclofenac sent to his pharmacy.

## 2016-08-22 ENCOUNTER — Encounter: Payer: Self-pay | Admitting: Family Medicine

## 2016-08-22 MED ORDER — TRAMADOL HCL 50 MG PO TABS: 50.0000 mg | ORAL_TABLET | Freq: Three times a day (TID) | ORAL | 0 refills | Status: DC | PRN

## 2016-08-22 NOTE — Telephone Encounter (Signed)
Dr Carollee Herter-- when I went to print Rx it gave me the below medication interactions.  Please advise if ok to print Rx?   Drug-Drug: traMADol and sertraline, escitalopram  Coadministration of Tramadol and Selective Serotonin Reuptake Inhibitors may be associated with an increased risk of serotonin syndrome and possibly an increase risk of seizures.  Details      traMADol (ULTRAM) 50 MG tablet Prescription. Reordered.   Discontinue sertraline (ZOLOFT) 50 MG tablet Patient reported medication. Active. Long-term.   Discontinue escitalopram (LEXAPRO) 20 MG tablet Prescription. Active. Long-term.   Drug/Drug  High  Drug-Drug: traMADol and nortriptyline  Increased risk of seizures is listed in the manufacturer's package labeling as a possibility when Tramadol and Tricyclic Antidepressants are coadministered. The possibility of serotonin syndrome when Tramadol and Tricyclic Antidepressants are coadministered also exists.      Last Tramadol Rx:  07/17/16, #30 No previous UDS or controlled substance on file.

## 2016-09-03 ENCOUNTER — Encounter: Payer: Self-pay | Admitting: Family Medicine

## 2016-09-04 ENCOUNTER — Encounter: Payer: Self-pay | Admitting: Medical

## 2016-09-04 ENCOUNTER — Other Ambulatory Visit: Payer: Self-pay

## 2016-09-04 MED ORDER — TRAMADOL HCL 50 MG PO TABS: 50.0000 mg | ORAL_TABLET | Freq: Three times a day (TID) | ORAL | 0 refills | Status: DC | PRN

## 2016-09-04 NOTE — Telephone Encounter (Signed)
Ok to refill 

## 2016-09-07 ENCOUNTER — Emergency Department (HOSPITAL_BASED_OUTPATIENT_CLINIC_OR_DEPARTMENT_OTHER)
Admission: EM | Admit: 2016-09-07 | Discharge: 2016-09-08 | Disposition: A | Discharge status: Home / Self Care | Payer: No Typology Code available for payment source | Attending: Emergency Medicine | Admitting: Emergency Medicine

## 2016-09-07 ENCOUNTER — Encounter (HOSPITAL_BASED_OUTPATIENT_CLINIC_OR_DEPARTMENT_OTHER): Payer: Self-pay

## 2016-09-07 DIAGNOSIS — R42 Dizziness and giddiness: Secondary | ICD-10-CM

## 2016-09-07 DIAGNOSIS — Z87891 Personal history of nicotine dependence: Secondary | ICD-10-CM | POA: Diagnosis not present

## 2016-09-07 DIAGNOSIS — I2510 Atherosclerotic heart disease of native coronary artery without angina pectoris: Secondary | ICD-10-CM | POA: Insufficient documentation

## 2016-09-07 DIAGNOSIS — Z7984 Long term (current) use of oral hypoglycemic drugs: Secondary | ICD-10-CM | POA: Insufficient documentation

## 2016-09-07 DIAGNOSIS — I10 Essential (primary) hypertension: Secondary | ICD-10-CM | POA: Diagnosis not present

## 2016-09-07 DIAGNOSIS — E119 Type 2 diabetes mellitus without complications: Secondary | ICD-10-CM | POA: Diagnosis not present

## 2016-09-07 DIAGNOSIS — J324 Chronic pansinusitis: Secondary | ICD-10-CM

## 2016-09-07 HISTORY — DX: Dizziness and giddiness: R42

## 2016-09-07 NOTE — ED Triage Notes (Addendum)
C/o dizziness x 2-3 days-head "pressure"-reports hx of vertigo dx-NAD-steady gait-pt added that an area to right LE with MRSA infection in 2016 has become dark in color x 2-3 days

## 2016-09-08 ENCOUNTER — Emergency Department (HOSPITAL_BASED_OUTPATIENT_CLINIC_OR_DEPARTMENT_OTHER): Payer: No Typology Code available for payment source

## 2016-09-08 ENCOUNTER — Encounter (HOSPITAL_BASED_OUTPATIENT_CLINIC_OR_DEPARTMENT_OTHER): Payer: Self-pay | Admitting: Emergency Medicine

## 2016-09-08 LAB — CBG MONITORING, ED: Glucose-Capillary: 281 mg/dL — ABNORMAL HIGH (ref 65–99)

## 2016-09-08 IMAGING — CT CT HEAD W/O CM
3 series · 16 of 47 positions shown, 19 images · non-contrast
Comparison: [DATE] CT of the head.

CLINICAL DATA: 55 y/o  M; episodes of dizziness.

EXAM:
CT HEAD WITHOUT CONTRAST
TECHNIQUE: Contiguous axial images were obtained from the base of the skull
through the vertex without intravenous contrast.

[Series 2: head wo · axial · 0.50mm/px · z∈[+812,+962]mm · 10 of 36 slices shown, 13 images]
[im 3/36  brain]
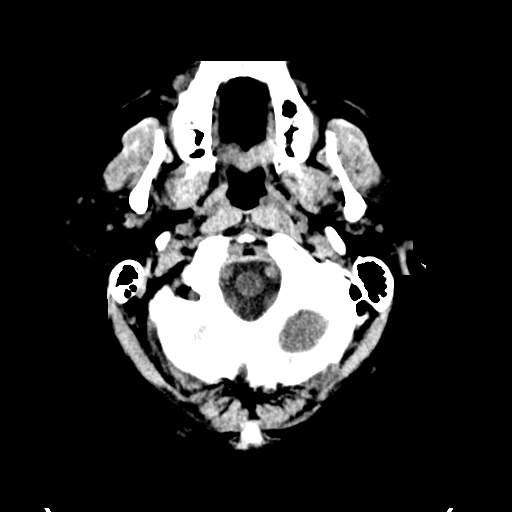
[im 3/36  bone]
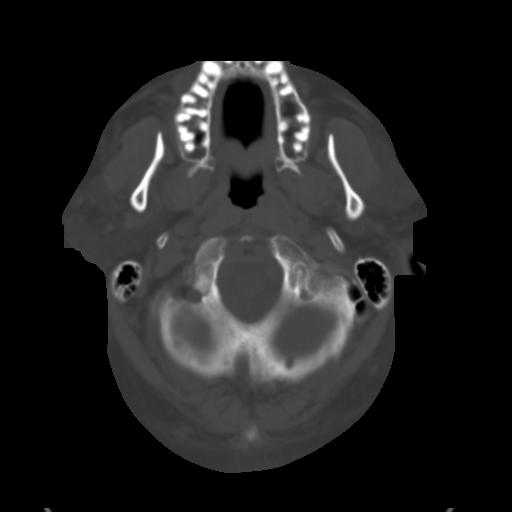
[im 7/36  brain]
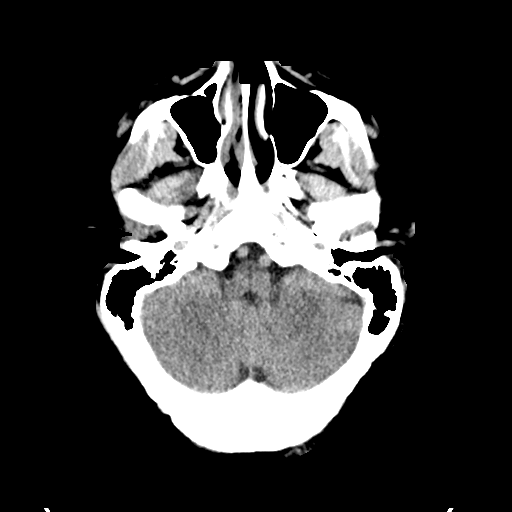
[im 10/36  brain]
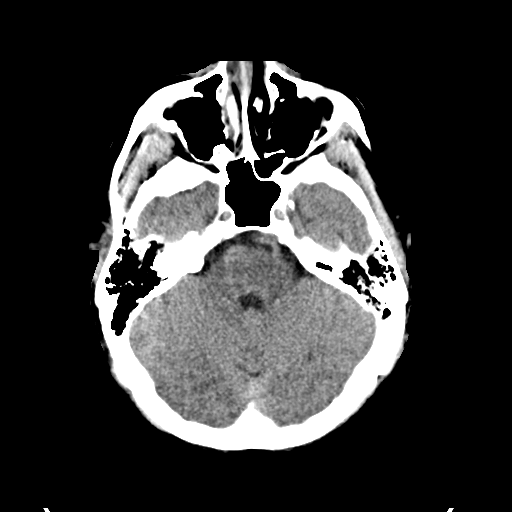
[im 13/36  brain]
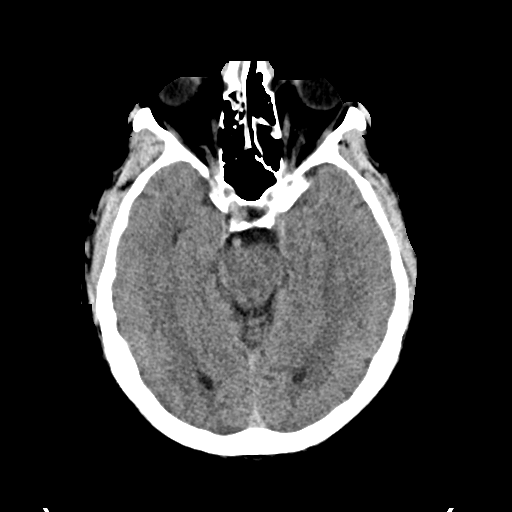
[im 16/36  brain]
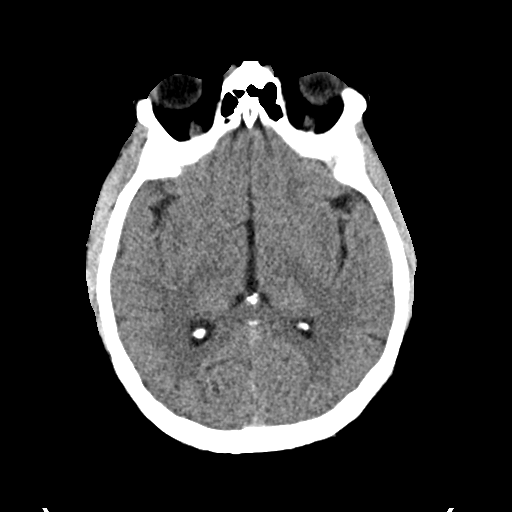
[im 16/36  bone]
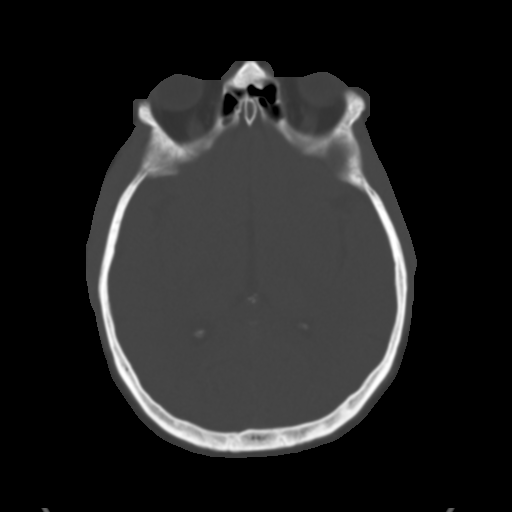
[im 20/36  brain]
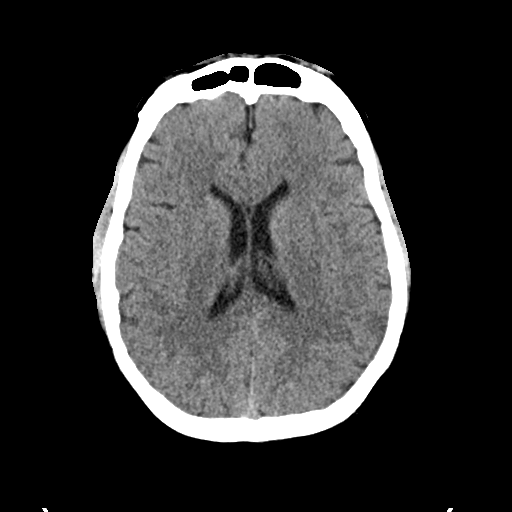
[im 23/36  brain]
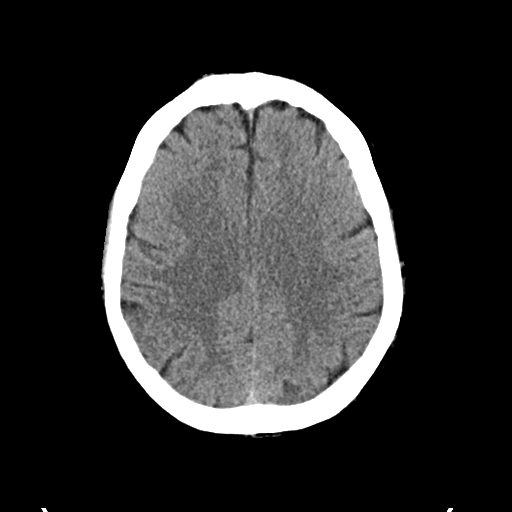
[im 27/36  brain]
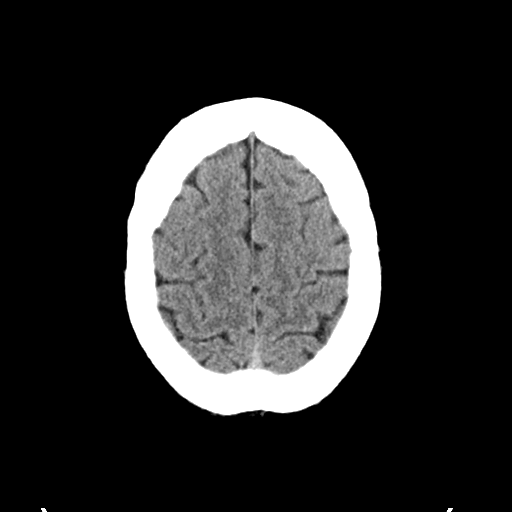
[im 29/36  brain]
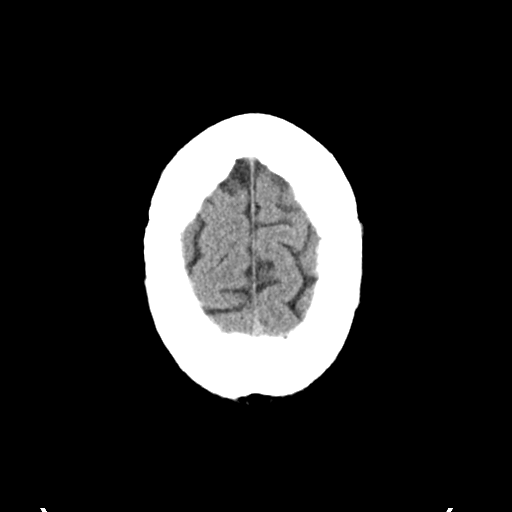
[im 29/36  bone]
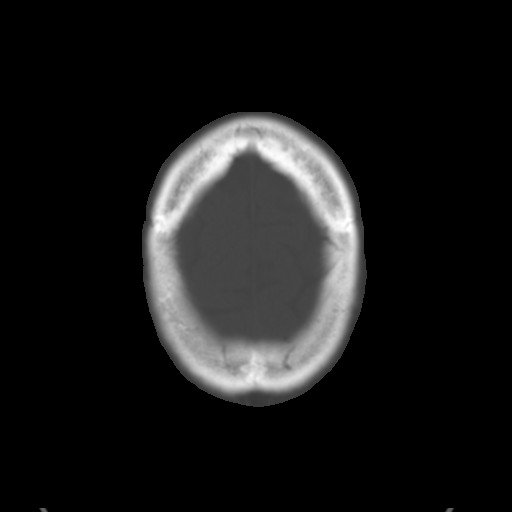
[im 33/36  brain]
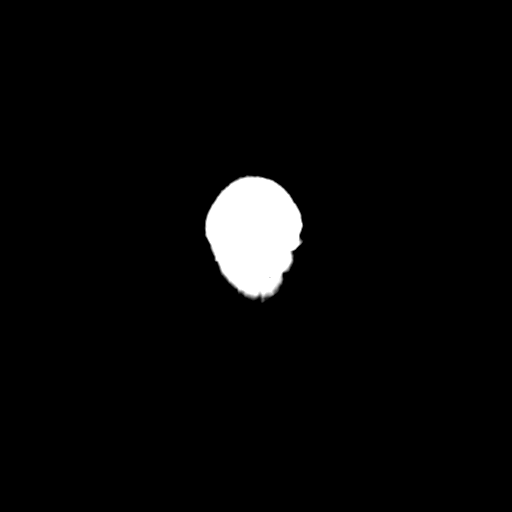

[Series 4: coronal soft · coronal · 0.37mm/px · 3 of 81 slices shown]
[im 27/81  brain]
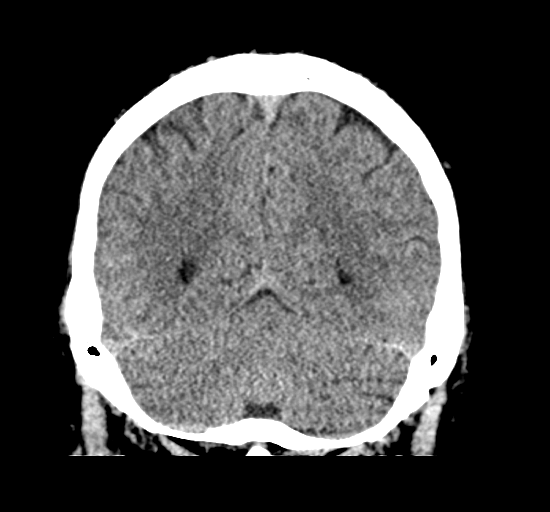
[im 36/81  brain]
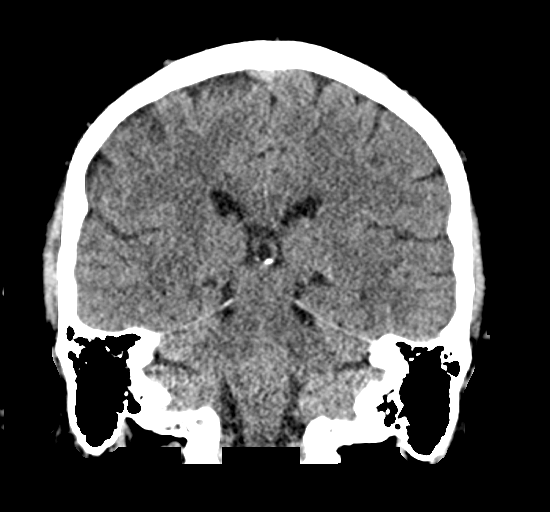
[im 45/81  brain]
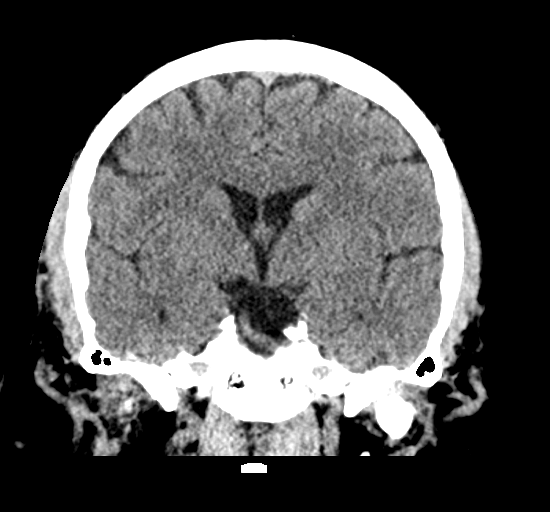

[Series 5: sag soft · sagittal · 0.35mm/px · 3 of 67 slices shown]
[im 23/67  brain]
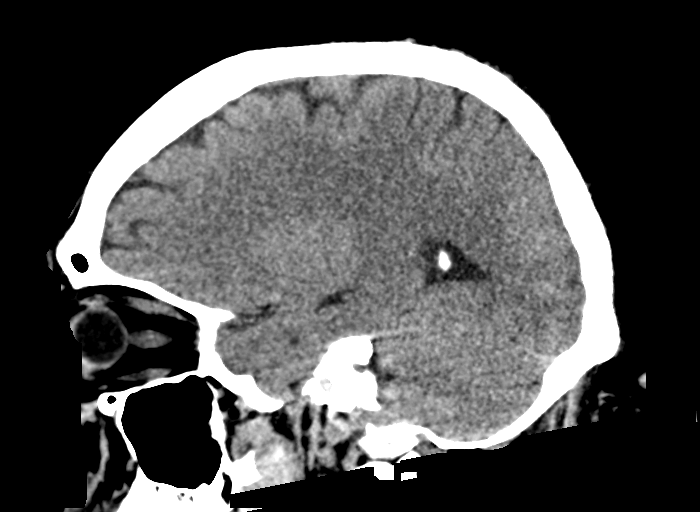
[im 34/67  brain]
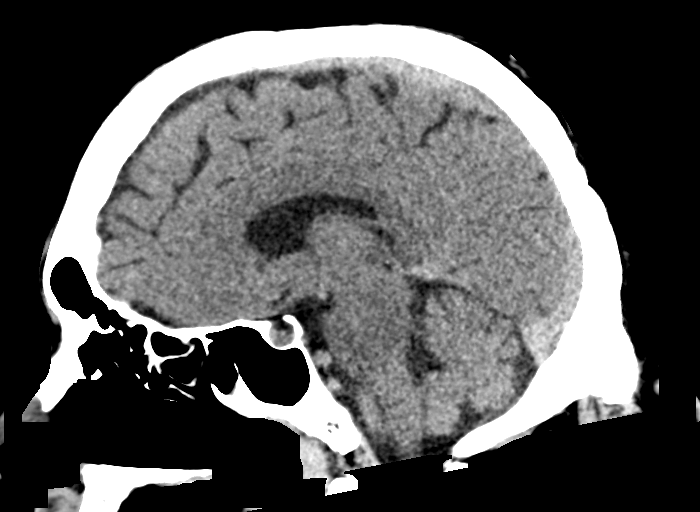
[im 45/67  brain]
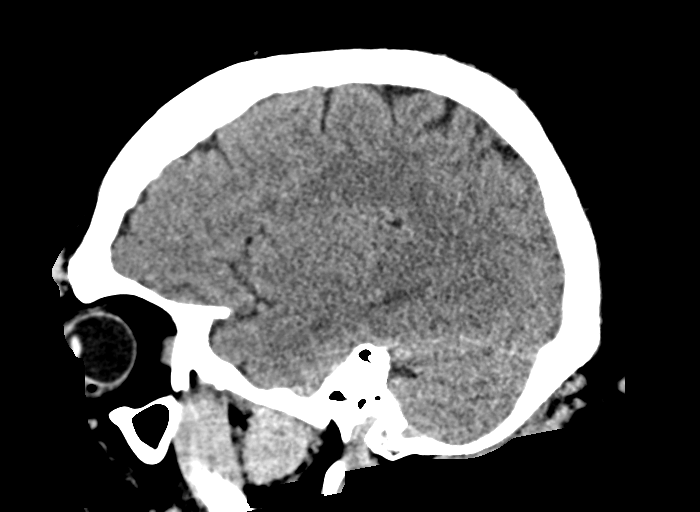

[16 of 47 positions shown; findings below may reference images not displayed]

FINDINGS: Brain: No evidence of acute infarction, hemorrhage, hydrocephalus,
extra-axial collection or mass lesion/mass effect.

Vascular: No hyperdense vessel or unexpected calcification.

Skull: Normal. Negative for fracture or focal lesion.

Sinuses/Orbits: Mild maxillary and ethmoid sinus mucosal thickening.
Normally pneumatized mastoid air cells.

Other: None.
IMPRESSION: No acute intracranial abnormality.  Mild paranasal sinus disease.

By: DORLON M.D.

## 2016-09-08 MED ORDER — OXYMETAZOLINE HCL 0.05 % NA SOLN
1.0000 | Freq: Once | NASAL | Status: AC
  Administered 2016-09-08: 1 via NASAL
  Filled 2016-09-08: qty 15

## 2016-09-08 MED ORDER — AMOXICILLIN 500 MG PO CAPS: 500.0000 mg | ORAL_CAPSULE | Freq: Three times a day (TID) | ORAL | 0 refills | Status: DC

## 2016-09-08 MED ORDER — FLUTICASONE PROPIONATE 50 MCG/ACT NA SUSP: 2.0000 | Freq: Every day | NASAL | 0 refills | Status: DC

## 2016-09-08 NOTE — ED Provider Notes (Signed)
[facility] DEPT [facility] Provider Note   CSN: [id number] Arrival date & time: 09/07/16  2219     History   Chief Complaint Chief Complaint  Patient presents with  . Dizziness    HPI Tyler Brewer is a 56 y.o. male.  The history is provided by the patient.  Dizziness  Quality:  Head spinning Severity:  Moderate Onset quality:  Gradual Timing:  Constant Progression:  Unchanged Chronicity:  Recurrent Context: bending over, ear pain and standing up   Relieved by:  Nothing Worsened by:  Nothing Ineffective treatments:  None tried Associated symptoms: no blood in stool, no chest pain, no diarrhea, no headaches, no hearing loss, no palpitations, no shortness of breath, no syncope, no tinnitus, no vision changes and no weakness   Associated symptoms comment:  Nasal congestion and ear pressure Risk factors: hx of vertigo     Past Medical History:  Diagnosis Date  . CAD (coronary artery disease)  cardiac cath with moderate disease in a septal branch of the ramus intermedius 04/01/2012  . Depression   . Diabetes mellitus    poorly controlled by his report  . History of narcotic addiction ([place])    past history of back pain  . Hypercholesteremia   . Hypertension   . IBS (irritable bowel syndrome)   . Obesity    Max weight was 390  . OSA on CPAP   . Panic attacks   . Testosterone deficiency   . Vertigo     Patient Active Problem List   Diagnosis Date Noted  . Diabetic polyneuropathy associated with diabetes mellitus due to underlying condition ([place]) 05/08/2016  . Methamphetamine use disorder, severe, dependence ([place]) 02/11/2016  . Substance induced mood disorder ([place]) 02/11/2016  . Exertional dyspnea 11/30/2015  . ADD (attention deficit disorder) 09/29/2015  . Binge eating 09/29/2015  . Syncope 05/05/2012  . Diarrhea 05/05/2012  . Panic attacks 05/05/2012  . OSA (obstructive sleep apnea) 05/05/2012  . HTN (hypertension) 05/05/2012  . Diabetes mellitus type II  05/05/2012  . Dyslipidemia 05/05/2012  . CAD (coronary artery disease)  cardiac cath with moderate disease in a septal branch of the ramus intermedius 04/01/2012  . Chest pain 04/01/2012  . Drug abuse and dependence ([employer]) 04/01/2012  . Family history of coronary artery disease 03/31/2012  . Sleep apnea, on C-pap 03/31/2012  . Hyperlipemia 03/30/2012  . HTN (hypertension), benign 03/30/2012  . Morbid obesity ([place]) 03/30/2012  . DM type 2, uncontrolled, with neuropathy ([place]) 03/30/2012  . Depression with anxiety 03/30/2012    Past Surgical History:  Procedure Laterality Date  . CARDIAC CATHETERIZATION    . LEFT HEART CATHETERIZATION WITH CORONARY ANGIOGRAM N/A 03/31/2012   Procedure: LEFT HEART CATHETERIZATION WITH CORONARY ANGIOGRAM;  Surgeon: Leonie Man, MD;  Location: [facility] CATH LAB;  Service: Cardiovascular;  Laterality: N/A;       Home Medications    Prior to Admission medications   Medication Sig Start Date End Date Taking? Authorizing Provider  amLODipine (NORVASC) 10 MG tablet Take 1 tablet (10 mg total) by mouth daily. 07/03/16   Edward Saguier, PA-C  B-D ULTRAFINE III SHORT PEN 31G X 8 MM MISC USE WITH VICTOZA AS INSTRUCTED 06/25/16   Rosalita Chessman Chase, DO  diclofenac (VOLTAREN) 75 MG EC tablet Take 1 tablet (75 mg total) by mouth 2 (two) times daily. 07/03/16   Percell Miller Saguier, PA-C  diclofenac (VOLTAREN) 75 MG EC tablet Take 1 tablet (75 mg total) by mouth 2 (two) times daily.  08/15/16   Percell Miller Saguier, PA-C  escitalopram (LEXAPRO) 20 MG tablet Take 1 tablet (20 mg total) by mouth daily. 07/03/16   Percell Miller Saguier, PA-C  furosemide (LASIX) 40 MG tablet Take 1 tablet (40 mg total) by mouth 2 (two) times daily. 07/03/16   Percell Miller Saguier, PA-C  glipiZIDE (GLUCOTROL) 10 MG tablet Take 1 tablet (10 mg total) by mouth 2 (two) times daily before a meal. 07/03/16   Mackie Pai, PA-C  liraglutide (VICTOZA) 18 MG/3ML SOPN 1.8 mg sq qd 07/17/16   Alferd Apa Lowne Chase, DO    metFORMIN (GLUCOPHAGE) 1000 MG tablet Take 1 tablet (1,000 mg total) by mouth 2 (two) times daily with a meal. 07/03/16   Mackie Pai, PA-C  metoprolol succinate (TOPROL-XL) 100 MG 24 hr tablet Take 1 tablet (100 mg total) by mouth daily. Take with or immediately following a meal. 07/03/16   Mackie Pai, PA-C  nortriptyline (PAMELOR) 10 MG capsule Take 2 capsules (20 mg total) by mouth at bedtime. 07/03/16   Percell Miller Saguier, PA-C  traMADol (ULTRAM) 50 MG tablet Take 1 tablet (50 mg total) by mouth every 8 (eight) hours as needed. 09/04/16   Rosalita Chessman Chase, DO  VICTOZA 18 MG/3ML SOPN INJECT 1.8 MG (0.3 ML) UNDER THE SKIN DAILY 07/19/16   [name], DO    Family History Family History  Problem Relation Age of Onset  . Diabetes type II Father   . Hypertension Father   . Pancreatic disease Father 70    Deceased  . Healthy Mother   . Healthy Sister   . Healthy Son   . Healthy Daughter     Social History Social History  Substance Use Topics  . Smoking status: Former Smoker    Quit date: 11/21/2015  . Smokeless tobacco: Never Used  . Alcohol use No     Allergies   Patient has no known allergies.   Review of Systems Review of Systems  Constitutional: Negative for chills, diaphoresis, fatigue and fever.  HENT: Positive for congestion and ear pain. Negative for ear discharge, facial swelling, hearing loss and tinnitus.   Respiratory: Negative for shortness of breath.   Cardiovascular: Negative for chest pain, palpitations and syncope.  Gastrointestinal: Negative for blood in stool and diarrhea.  Neurological: Positive for dizziness. Negative for tremors, seizures, syncope, facial asymmetry, speech difficulty, weakness, light-headedness, numbness and headaches.  All other systems reviewed and are negative.    Physical Exam Updated Vital Signs BP 146/84 (BP Location: Right Arm)   Pulse 66   Temp 97.8 F (36.6 C) (Oral)   Resp 18   Ht 5\' 11"  (1.803 m)    Wt (!) 353 lb (160.1 kg)   SpO2 99%   BMI 49.23 kg/m   Physical Exam  Constitutional: He is oriented to person, place, and time. He appears well-developed and well-nourished. No distress.  HENT:  Head: Normocephalic and atraumatic.  B retracted TM clear colorless post nasal drip  Eyes: Conjunctivae and EOM are normal. Pupils are equal, round, and reactive to light.  Neck: Normal range of motion. Neck supple. No JVD present.  Cardiovascular: Normal rate, regular rhythm and intact distal pulses.   Pulmonary/Chest: Effort normal and breath sounds normal. No stridor. He has no wheezes. He has no rales.  Abdominal: Soft. Bowel sounds are normal. He exhibits no mass. There is no tenderness. There is no rebound and no guarding.  Musculoskeletal: Normal range of motion.  Neurological: He is alert and oriented  to person, place, and time.  Skin: Skin is warm and dry. Capillary refill takes less than 2 seconds.  Scab on the right shin.  Clean dry and intact  Psychiatric: He has a normal mood and affect.     ED Treatments / Results   Vitals:   09/07/16 2233 09/08/16 0037  BP: 163/100 146/84  Pulse: 76 66  Resp: 20 18  Temp: 97.8 F (36.6 C)     EKG  EKG Interpretation  Date/Time:  Friday September 07 2016 22:38:18 EST Ventricular Rate:  69 PR Interval:  198 QRS Duration: 92 QT Interval:  388 QTC Calculation: 415 R Axis:   -29 Text Interpretation:  Normal sinus rhythm Normal ECG no acute changes  Confirmed by LIU MD, DANA [phone]) on 09/07/2016 11:23:20 PM       Results for orders placed or performed during the hospital encounter of 09/07/16  POC CBG, ED  Result Value Ref Range   Glucose-Capillary 281 (H) 65 - 99 mg/dL   Ct Head Wo Contrast  Result Date: 09/08/2016 CLINICAL DATA:  56 y/o  M; episodes of dizziness. EXAM: CT HEAD WITHOUT CONTRAST TECHNIQUE: Contiguous axial images were obtained from the base of the skull through the vertex without intravenous contrast.  COMPARISON:  05/04/2012 CT of the head. FINDINGS: Brain: No evidence of acute infarction, hemorrhage, hydrocephalus, extra-axial collection or mass lesion/mass effect. Vascular: No hyperdense vessel or unexpected calcification. Skull: Normal. Negative for fracture or focal lesion. Sinuses/Orbits: Mild maxillary and ethmoid sinus mucosal thickening. Normally pneumatized mastoid air cells. Other: None. IMPRESSION: No acute intracranial abnormality.  Mild paranasal sinus disease. Electronically Signed   By: Kristine Garbe M.D.   On: 09/08/2016 03:07    Procedures Procedures (including critical care time)  Medications Ordered in ED Medications  oxymetazoline (AFRIN) 0.05 % nasal spray 1 spray (1 spray Each Nare Given 09/08/16 0402)      Final Clinical Impressions(s) / ED Diagnoses  Vertigo secondary to pansinusitis: Symptoms are clearly peripheral given movement and time course with negative CT scan.   Will treat with amoxicillin, flonase and meclizine.  Follow up with your PMD.  All questions answered to patient's satisfaction. Based on history and exam patient has been appropriately medically screened and emergency conditions excluded. Patient is stable for discharge at this time. Strict return precautions given for any further episodes, persistent fever, weakness or any concerns.    Veatrice Kells, MD 09/08/16 [phone]

## 2016-09-08 NOTE — ED Notes (Signed)
CBG is 281

## 2016-09-08 NOTE — ED Notes (Signed)
C/o dizziness x 2 days diff walking at times, pressure in head,  Denies n/v

## 2016-09-10 ENCOUNTER — Encounter: Payer: Self-pay | Admitting: Medical

## 2016-09-10 DIAGNOSIS — I10 Essential (primary) hypertension: Secondary | ICD-10-CM

## 2016-09-10 MED ORDER — AMLODIPINE BESYLATE 10 MG PO TABS: 10.0000 mg | ORAL_TABLET | Freq: Every day | ORAL | 0 refills | Status: DC

## 2016-09-13 ENCOUNTER — Other Ambulatory Visit: Payer: Self-pay | Admitting: Family Medicine

## 2016-09-13 DIAGNOSIS — E1165 Type 2 diabetes mellitus with hyperglycemia: Principal | ICD-10-CM

## 2016-09-13 DIAGNOSIS — IMO0002 Reserved for concepts with insufficient information to code with codable children: Secondary | ICD-10-CM

## 2016-09-13 DIAGNOSIS — E1151 Type 2 diabetes mellitus with diabetic peripheral angiopathy without gangrene: Secondary | ICD-10-CM

## 2016-09-13 NOTE — Telephone Encounter (Signed)
Self. Refill request for MetFormin   Pharmacy: [facility]    ALSO,   Victoza   Pharmacy: [facility] - [place], [address]

## 2016-09-14 MED ORDER — LIRAGLUTIDE 18 MG/3ML ~~LOC~~ SOPN: PEN_INJECTOR | SUBCUTANEOUS | 2 refills | Status: DC

## 2016-09-14 MED ORDER — METFORMIN HCL 1000 MG PO TABS: 1000.0000 mg | ORAL_TABLET | Freq: Two times a day (BID) | ORAL | 2 refills | Status: DC

## 2016-09-14 NOTE — Telephone Encounter (Signed)
Patient notified

## 2016-09-14 NOTE — Telephone Encounter (Signed)
Victoza sent to [facility] [address] And Metformin [facility] [place] both as requested.

## 2016-09-25 ENCOUNTER — Encounter: Payer: Self-pay | Admitting: Family Medicine

## 2016-09-25 ENCOUNTER — Other Ambulatory Visit: Payer: Self-pay | Admitting: Family Medicine

## 2016-09-25 ENCOUNTER — Ambulatory Visit (HOSPITAL_BASED_OUTPATIENT_CLINIC_OR_DEPARTMENT_OTHER)
Admission: RE | Admit: 2016-09-25 | Discharge: 2016-09-25 | Disposition: A | Discharge status: Home / Self Care | Payer: BLUE CROSS/BLUE SHIELD | Source: Ambulatory Visit | Attending: Family Medicine | Admitting: Family Medicine

## 2016-09-25 ENCOUNTER — Ambulatory Visit (INDEPENDENT_AMBULATORY_CARE_PROVIDER_SITE_OTHER): Payer: BLUE CROSS/BLUE SHIELD | Admitting: Family Medicine

## 2016-09-25 ENCOUNTER — Other Ambulatory Visit: Payer: Self-pay

## 2016-09-25 VITALS — BP 189/128 | HR 76 | Temp 98.1°F | Ht 71.0 in | Wt 353.2 lb

## 2016-09-25 DIAGNOSIS — R600 Localized edema: Secondary | ICD-10-CM

## 2016-09-25 DIAGNOSIS — E785 Hyperlipidemia, unspecified: Secondary | ICD-10-CM

## 2016-09-25 DIAGNOSIS — IMO0002 Reserved for concepts with insufficient information to code with codable children: Secondary | ICD-10-CM

## 2016-09-25 DIAGNOSIS — I10 Essential (primary) hypertension: Secondary | ICD-10-CM

## 2016-09-25 DIAGNOSIS — E1165 Type 2 diabetes mellitus with hyperglycemia: Secondary | ICD-10-CM | POA: Diagnosis not present

## 2016-09-25 DIAGNOSIS — R0602 Shortness of breath: Secondary | ICD-10-CM

## 2016-09-25 DIAGNOSIS — E1151 Type 2 diabetes mellitus with diabetic peripheral angiopathy without gangrene: Secondary | ICD-10-CM | POA: Diagnosis not present

## 2016-09-25 DIAGNOSIS — Z1159 Encounter for screening for other viral diseases: Secondary | ICD-10-CM | POA: Diagnosis not present

## 2016-09-25 DIAGNOSIS — J324 Chronic pansinusitis: Secondary | ICD-10-CM

## 2016-09-25 DIAGNOSIS — R079 Chest pain, unspecified: Secondary | ICD-10-CM | POA: Diagnosis not present

## 2016-09-25 DIAGNOSIS — F411 Generalized anxiety disorder: Secondary | ICD-10-CM | POA: Diagnosis not present

## 2016-09-25 DIAGNOSIS — E1140 Type 2 diabetes mellitus with diabetic neuropathy, unspecified: Secondary | ICD-10-CM

## 2016-09-25 LAB — CBC
HCT: 46.9 % (ref 39.0–52.0)
Hemoglobin: 15.7 g/dL (ref 13.0–17.0)
MCHC: 33.5 g/dL (ref 30.0–36.0)
MCV: 87.4 fl (ref 78.0–100.0)
Platelets: 286 10*3/uL (ref 150.0–400.0)
RBC: 5.36 Mil/uL (ref 4.22–5.81)
RDW: 14.1 % (ref 11.5–15.5)
WBC: 5.8 10*3/uL (ref 4.0–10.5)

## 2016-09-25 LAB — LIPID PANEL
Cholesterol: 228 mg/dL — ABNORMAL HIGH (ref 0–200)
HDL: 51.8 mg/dL (ref >39.00)
NonHDL: 176.29
Total CHOL/HDL Ratio: 4
Triglycerides: 233 mg/dL — ABNORMAL HIGH (ref 0.0–149.0)
VLDL: 46.6 mg/dL — ABNORMAL HIGH (ref 0.0–40.0)

## 2016-09-25 LAB — LDL CHOLESTEROL, DIRECT: Direct LDL: 146 mg/dL

## 2016-09-25 LAB — URINALYSIS
Bilirubin Urine: NEGATIVE
Hgb urine dipstick: NEGATIVE
Ketones, ur: NEGATIVE
Leukocytes, UA: NEGATIVE
Nitrite: NEGATIVE
Specific Gravity, Urine: 1.025 (ref 1.000–1.030)
Total Protein, Urine: NEGATIVE
Urine Glucose: 100 — AB
Urobilinogen, UA: 0.2 (ref 0.0–1.0)
pH: 5.5 (ref 5.0–8.0)

## 2016-09-25 LAB — MICROALBUMIN / CREATININE URINE RATIO
Creatinine, Urine: 170 mg/dL (ref 20–370)
Microalb Creat Ratio: 9 mcg/mg creat (ref <30)
Microalb, Ur: 1.5 mg/dL

## 2016-09-25 LAB — COMPREHENSIVE METABOLIC PANEL
ALT: 31 U/L (ref 0–53)
AST: 18 U/L (ref 0–37)
Albumin: 4.2 g/dL (ref 3.5–5.2)
Alkaline Phosphatase: 73 U/L (ref 39–117)
BUN: 14 mg/dL (ref 6–23)
CO2: 26 mEq/L (ref 19–32)
Calcium: 9.9 mg/dL (ref 8.4–10.5)
Chloride: 103 mEq/L (ref 96–112)
Creatinine, Ser: 0.76 mg/dL (ref 0.40–1.50)
GFR: 113.87 mL/min (ref >60.00)
Glucose, Bld: 180 mg/dL — ABNORMAL HIGH (ref 70–99)
Potassium: 3.9 mEq/L (ref 3.5–5.1)
Sodium: 136 mEq/L (ref 135–145)
Total Bilirubin: 0.4 mg/dL (ref 0.2–1.2)
Total Protein: 7 g/dL (ref 6.0–8.3)

## 2016-09-25 LAB — HEMOGLOBIN A1C: Hgb A1c MFr Bld: 8.8 % — ABNORMAL HIGH (ref 4.6–6.5)

## 2016-09-25 LAB — HEPATITIS C ANTIBODY: HCV Ab: NEGATIVE

## 2016-09-25 IMAGING — DX DG CHEST 2V
2 series · 2 of 2 positions shown · non-contrast
Comparison: Radiographs [DATE].

CLINICAL DATA: Shortness of breath.

EXAM:
CHEST  2 VIEW

[chest pa]
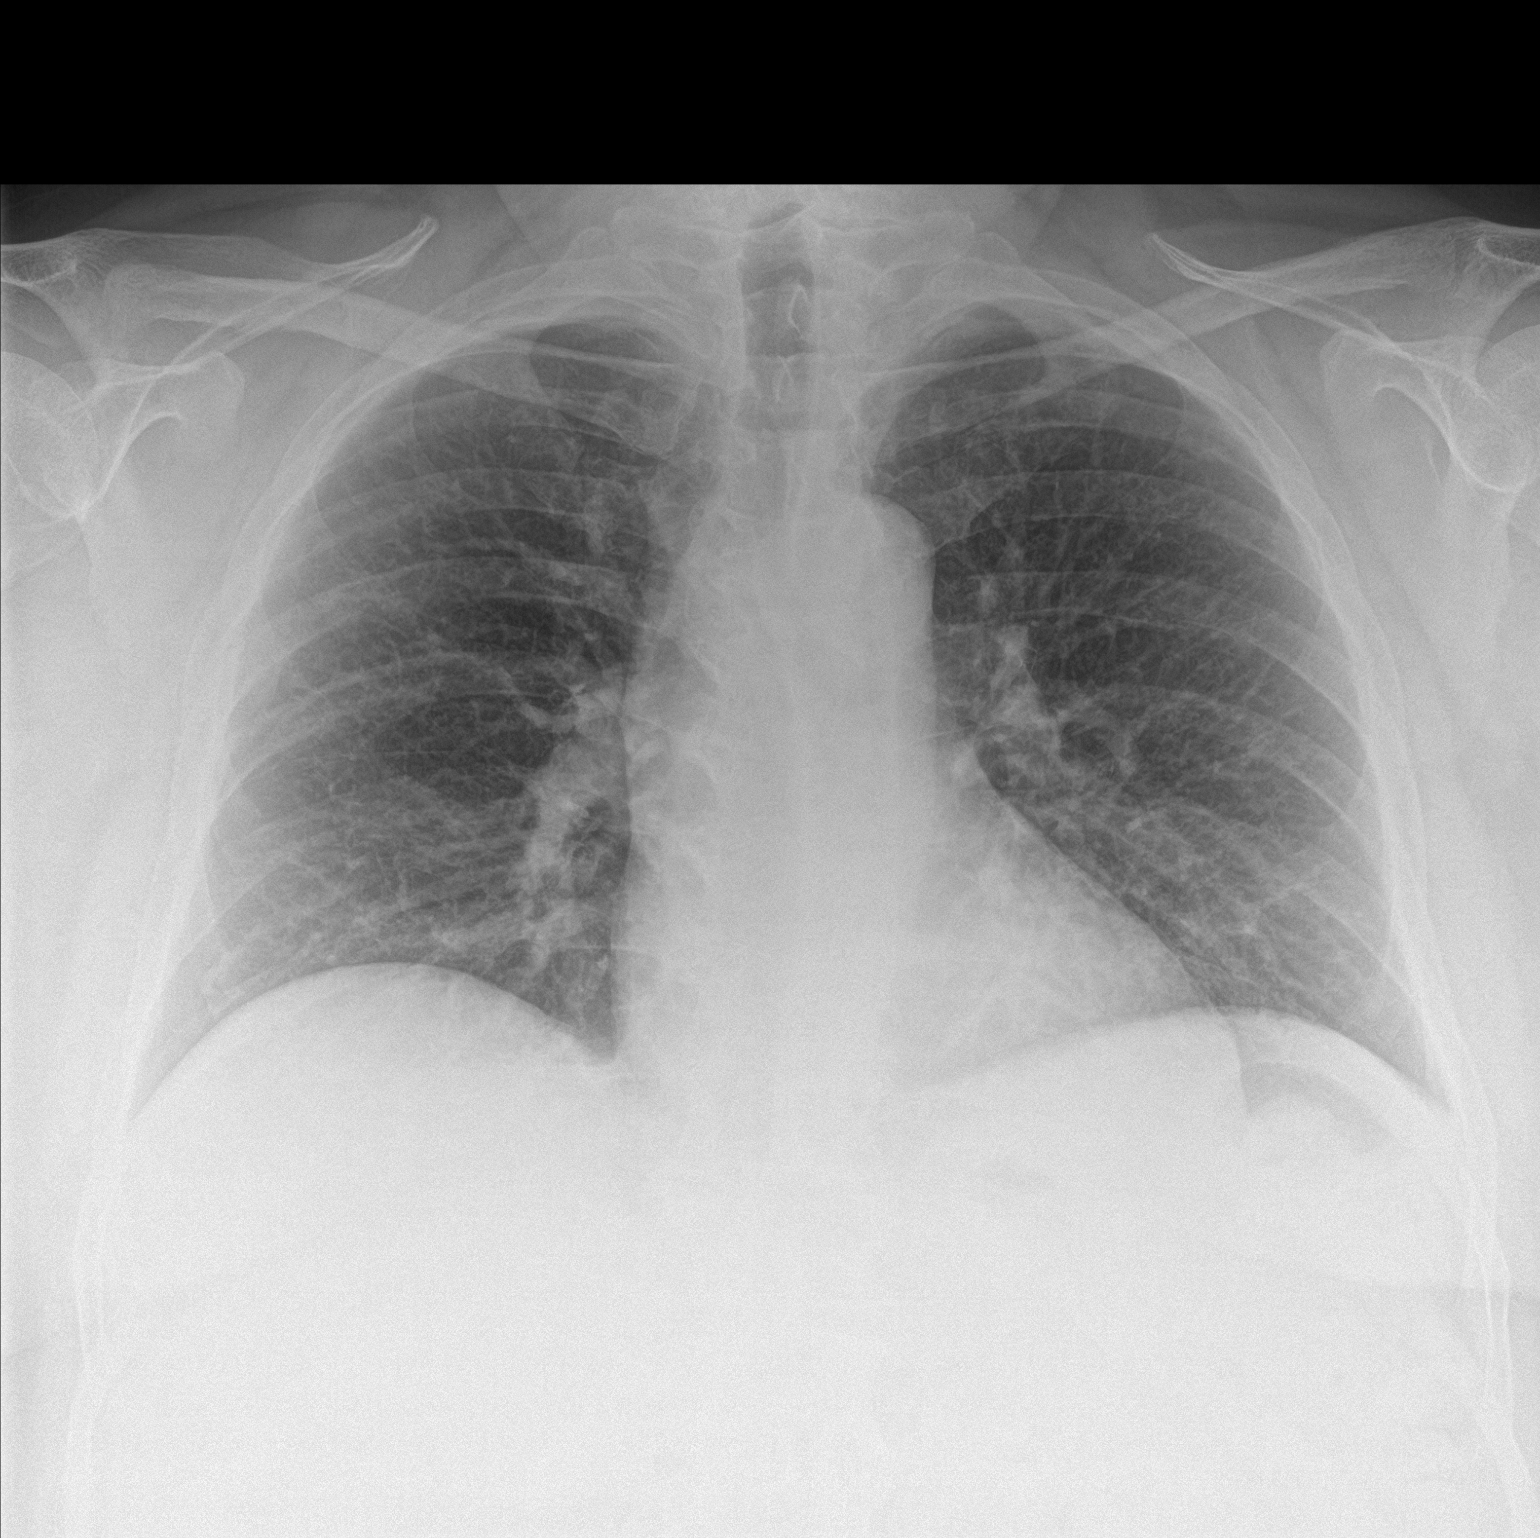

[chest lat]
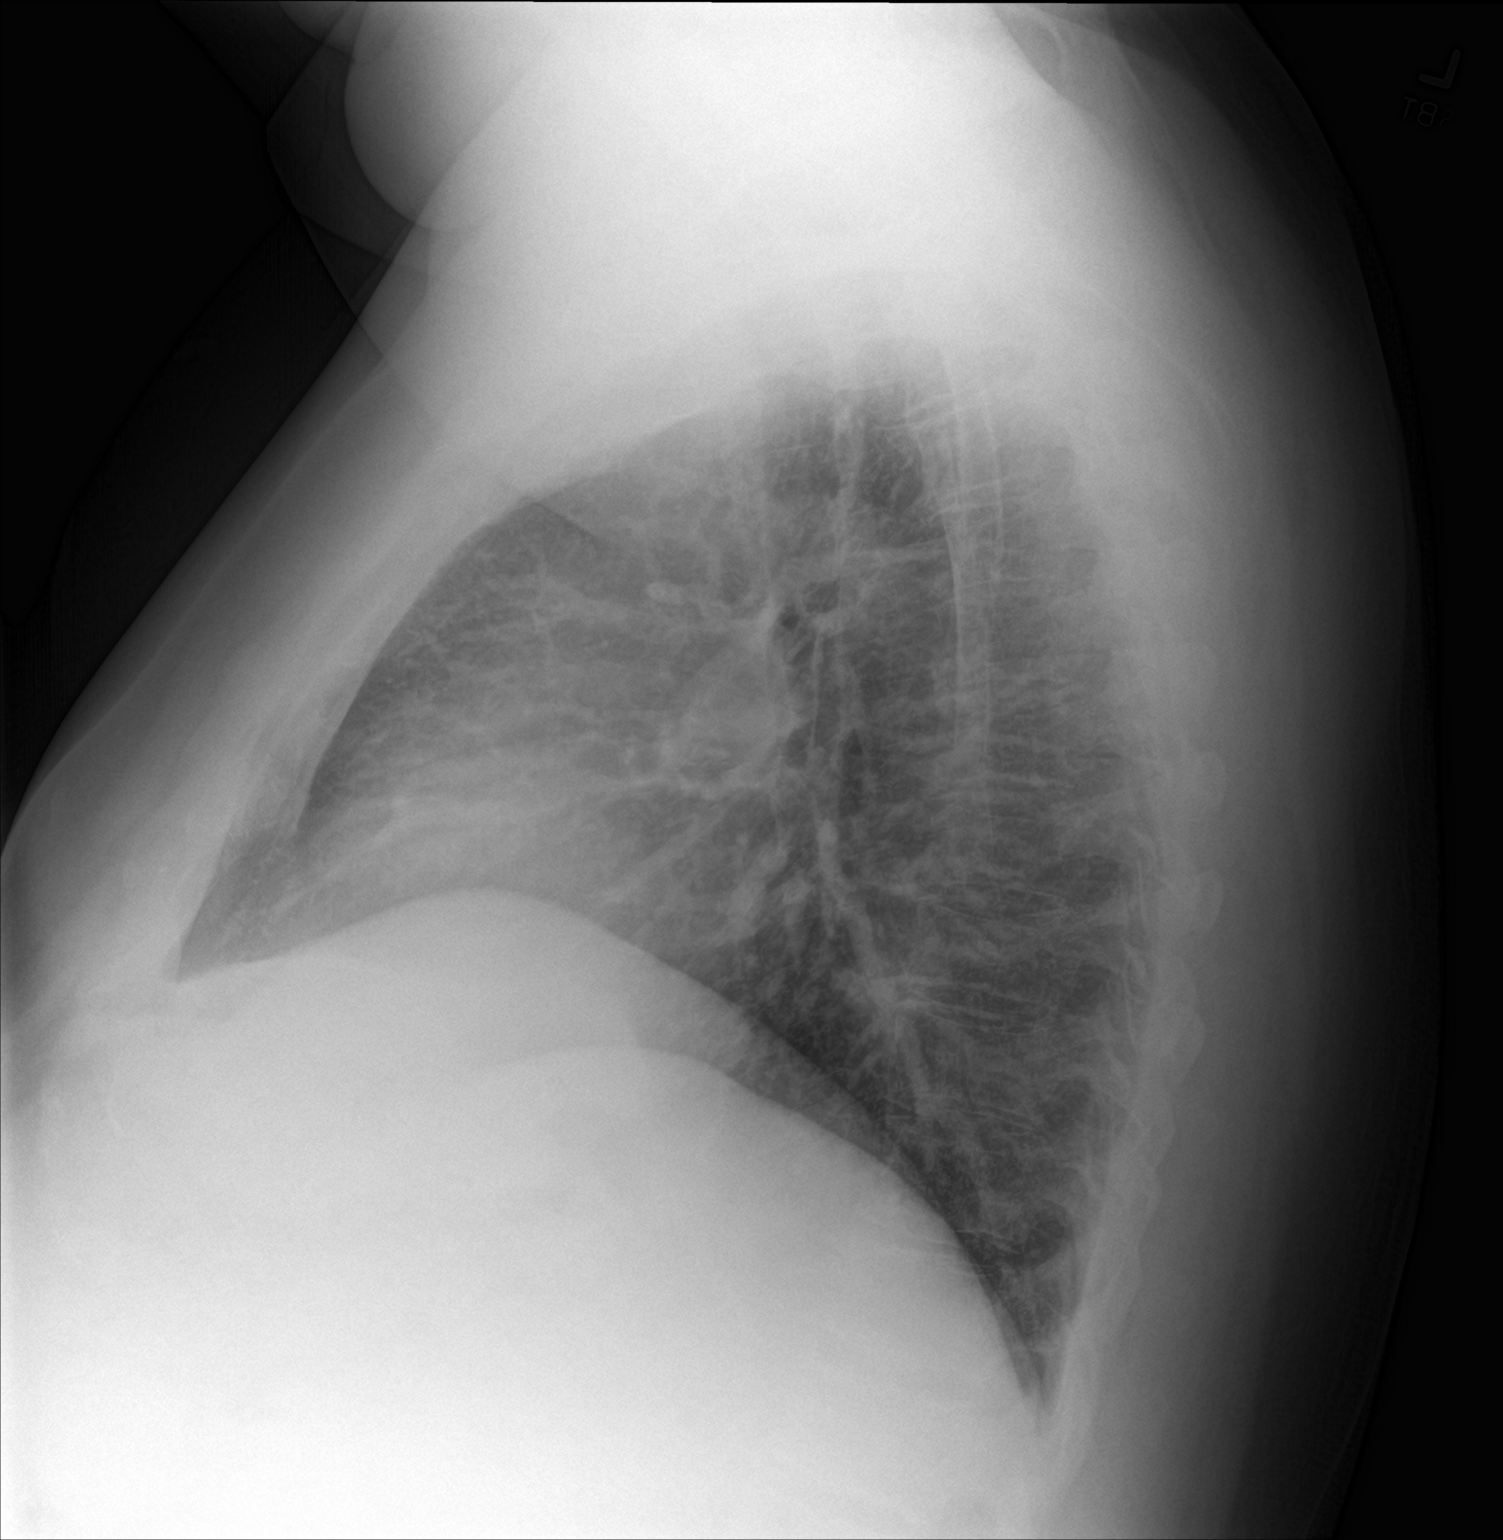

[2 of 2 positions shown; findings below may reference images not displayed]

FINDINGS: The heart size and mediastinal contours are within normal limits.
Both lungs are clear. No pneumothorax or pleural effusion is noted.
The visualized skeletal structures are unremarkable.
IMPRESSION: No active cardiopulmonary disease.

## 2016-09-25 MED ORDER — METFORMIN HCL 1000 MG PO TABS: 1000.0000 mg | ORAL_TABLET | Freq: Two times a day (BID) | ORAL | 2 refills | Status: DC

## 2016-09-25 MED ORDER — AMLODIPINE BESYLATE 10 MG PO TABS: 10.0000 mg | ORAL_TABLET | Freq: Every day | ORAL | 0 refills | Status: DC

## 2016-09-25 MED ORDER — VALSARTAN 160 MG PO TABS: 160.0000 mg | ORAL_TABLET | Freq: Every day | ORAL | 2 refills | Status: DC

## 2016-09-25 MED ORDER — GLIPIZIDE 10 MG PO TABS: 10.0000 mg | ORAL_TABLET | Freq: Two times a day (BID) | ORAL | 2 refills | Status: DC

## 2016-09-25 MED ORDER — FLUTICASONE PROPIONATE 50 MCG/ACT NA SUSP: 2.0000 | Freq: Every day | NASAL | 0 refills | Status: DC

## 2016-09-25 MED ORDER — METOPROLOL SUCCINATE ER 100 MG PO TB24: 100.0000 mg | ORAL_TABLET | Freq: Every day | ORAL | 2 refills | Status: DC

## 2016-09-25 MED ORDER — FUROSEMIDE 40 MG PO TABS: 40.0000 mg | ORAL_TABLET | Freq: Two times a day (BID) | ORAL | 2 refills | Status: DC

## 2016-09-25 MED ORDER — AMOXICILLIN-POT CLAVULANATE 875-125 MG PO TABS: 1.0000 | ORAL_TABLET | Freq: Two times a day (BID) | ORAL | 0 refills | Status: DC

## 2016-09-25 MED ORDER — LIRAGLUTIDE 18 MG/3ML ~~LOC~~ SOPN: PEN_INJECTOR | SUBCUTANEOUS | 0 refills | Status: DC

## 2016-09-25 NOTE — Assessment & Plan Note (Signed)
Check labs con't meds 

## 2016-09-25 NOTE — Assessment & Plan Note (Signed)
Discussed diet and exercise 

## 2016-09-25 NOTE — Assessment & Plan Note (Signed)
ekg no change If pain returns-- go to ER

## 2016-09-25 NOTE — Progress Notes (Signed)
Patient ID: Tyler Brewer, male   DOB: 1960-08-29, 56 y.o.   MRN: [id number]   Subjective:    Patient ID: Tyler Brewer, male    DOB: [birth year], 56 y.o.   MRN: [id number]  Chief Complaint  Patient presents with  . Follow-up    Medications  . Sinusitis  . Shortness of Breath  . Chest Pain    Sinusitis  The current episode started 1 to 4 weeks ago. The problem is unchanged. There has been no fever. His pain is at a severity of 8/10. Associated symptoms include congestion and shortness of breath. Pertinent negatives include no headaches. Past treatments include acetaminophen and lying down. The treatment provided no relief.  Shortness of Breath  This is a new problem. The current episode started more than 1 month ago. The problem occurs daily. The problem has been waxing and waning. Associated symptoms include chest pain. Pertinent negatives include no abdominal pain, fever, headaches, leg swelling or rash.  Chest Pain   This is a new problem. The current episode started more than 1 month ago. The onset quality is undetermined. The problem has been waxing and waning. Associated symptoms include shortness of breath. Pertinent negatives include no abdominal pain, dizziness, fever, headaches, malaise/fatigue, nausea or palpitations.    Patient is in today for a follow up on medications.  Patient also has complaints of shortness of breath and chest pains off and on for the past month with sinus congestion as noted above.   NO other concerns noted at this time.  I acted as a Education administrator for [employer] , DO. Raiford Noble, [place]   Past Medical History:  Diagnosis Date  . CAD (coronary artery disease)  cardiac cath with moderate disease in a septal branch of the ramus intermedius 04/01/2012  . Depression   . Diabetes mellitus    poorly controlled by his report  . History of narcotic addiction ([place])    past history of back pain  . Hypercholesteremia   . Hypertension   . IBS (irritable bowel  syndrome)   . Obesity    Max weight was 390  . OSA on CPAP   . Panic attacks   . Testosterone deficiency   . Vertigo     Past Surgical History:  Procedure Laterality Date  . CARDIAC CATHETERIZATION    . LEFT HEART CATHETERIZATION WITH CORONARY ANGIOGRAM N/A 03/31/2012   Procedure: LEFT HEART CATHETERIZATION WITH CORONARY ANGIOGRAM;  Surgeon: Leonie Man, MD;  Location: [facility] CATH LAB;  Service: Cardiovascular;  Laterality: N/A;    Family History  Problem Relation Age of Onset  . Diabetes type II Father   . Hypertension Father   . Pancreatic disease Father 77    Deceased  . Healthy Mother   . Healthy Sister   . Healthy Son   . Healthy Daughter     Social History   Social History  . Marital status: Divorced    Spouse name: N/A  . Number of children: 2  . Years of education: N/A   Occupational History  . Not on file.   Social History Main Topics  . Smoking status: Former Smoker    Quit date: 11/21/2015  . Smokeless tobacco: Never Used  . Alcohol use No  . Drug use: No  . Sexual activity: Not Currently   Other Topics Concern  . Not on file   Social History Narrative   Lives with mother in a one story home.  Has 2 grown children.  Works as a Barrister's clerk.  Education: Oceanographer.    Outpatient Medications Prior to Visit  Medication Sig Dispense Refill  . B-D ULTRAFINE III SHORT PEN 31G X 8 MM MISC USE WITH VICTOZA AS INSTRUCTED 100 each 0  . liraglutide (VICTOZA) 18 MG/3ML SOPN Inject 1.8 mg under the skin daily 6 mL 2  . nortriptyline (PAMELOR) 10 MG capsule Take 2 capsules (20 mg total) by mouth at bedtime. 60 capsule 3  . traMADol (ULTRAM) 50 MG tablet Take 1 tablet (50 mg total) by mouth every 8 (eight) hours as needed. 30 tablet 0  . amLODipine (NORVASC) 10 MG tablet Take 1 tablet (10 mg total) by mouth daily. 30 tablet 0  . escitalopram (LEXAPRO) 20 MG tablet Take 1 tablet (20 mg total) by mouth daily. 30 tablet 2  . fluticasone (FLONASE) 50 MCG/ACT nasal  spray Place 2 sprays into both nostrils daily. 16 g 0  . furosemide (LASIX) 40 MG tablet Take 1 tablet (40 mg total) by mouth 2 (two) times daily. 60 tablet 2  . glipiZIDE (GLUCOTROL) 10 MG tablet Take 1 tablet (10 mg total) by mouth 2 (two) times daily before a meal. 60 tablet 2  . liraglutide (VICTOZA) 18 MG/3ML SOPN 1.8 mg sq qd 6 mL 0  . metFORMIN (GLUCOPHAGE) 1000 MG tablet Take 1 tablet (1,000 mg total) by mouth 2 (two) times daily with a meal. 60 tablet 2  . metoprolol succinate (TOPROL-XL) 100 MG 24 hr tablet Take 1 tablet (100 mg total) by mouth daily. Take with or immediately following a meal. 30 tablet 2  . amoxicillin (AMOXIL) 500 MG capsule Take 1 capsule (500 mg total) by mouth 3 (three) times daily. (Patient not taking: Reported on 09/25/2016) 21 capsule 0  . diclofenac (VOLTAREN) 75 MG EC tablet Take 1 tablet (75 mg total) by mouth 2 (two) times daily. (Patient not taking: Reported on 09/25/2016) 30 tablet 0  . diclofenac (VOLTAREN) 75 MG EC tablet Take 1 tablet (75 mg total) by mouth 2 (two) times daily. (Patient not taking: Reported on 09/25/2016) 30 tablet 0   No facility-administered medications prior to visit.     No Known Allergies  Review of Systems  Constitutional: Negative for fever and malaise/fatigue.  HENT: Positive for congestion.   Eyes: Negative for blurred vision.  Respiratory: Positive for shortness of breath.   Cardiovascular: Positive for chest pain. Negative for palpitations and leg swelling.  Gastrointestinal: Negative for abdominal pain, blood in stool and nausea.  Genitourinary: Negative for dysuria and frequency.  Musculoskeletal: Negative for falls.  Skin: Negative for rash.  Neurological: Negative for dizziness, loss of consciousness and headaches.  Endo/Heme/Allergies: Negative for environmental allergies.  Psychiatric/Behavioral: Negative for depression. The patient is not nervous/anxious.        Objective:    Physical Exam  Constitutional: He  appears well-developed and well-nourished. No distress.  HENT:  Head: Normocephalic and atraumatic.  Right Ear: Tympanic membrane normal.  Left Ear: Tympanic membrane normal.  Nose: Mucosal edema and rhinorrhea present. Right sinus exhibits maxillary sinus tenderness and frontal sinus tenderness. Left sinus exhibits maxillary sinus tenderness and frontal sinus tenderness.  Mouth/Throat: Mucous membranes are normal. Oropharyngeal exudate and posterior oropharyngeal erythema present. No posterior oropharyngeal edema.  + PND  Eyes: Conjunctivae and EOM are normal. Pupils are equal, round, and reactive to light.  Neck: Normal range of motion. Neck supple.  Cardiovascular: Normal rate, regular rhythm and normal heart sounds.   Pulmonary/Chest: Effort normal and  breath sounds normal. No respiratory distress. He has no wheezes.  + hacking cough  Lymphadenopathy:    He has no cervical adenopathy.  Skin: Skin is warm and dry.  Psychiatric: He has a normal mood and affect. His behavior is normal. Judgment and thought content normal.  Nursing note and vitals reviewed.   BP (!) 189/128 (BP Location: Left Arm, Patient Position: Sitting, Cuff Size: Large)   Pulse 76   Temp 98.1 F (36.7 C) (Oral)   Ht 5\' 11"  (1.803 m)   Wt (!) 353 lb 3.2 oz (160.2 kg)   SpO2 98% Comment: RA  BMI 49.26 kg/m  Wt Readings from Last 3 Encounters:  09/25/16 (!) 353 lb 3.2 oz (160.2 kg)  09/07/16 (!) 353 lb (160.1 kg)  07/03/16 (!) 351 lb 3.2 oz (159.3 kg)     Lab Results  Component Value Date   WBC 5.8 09/25/2016   HGB 15.7 09/25/2016   HCT 46.9 09/25/2016   PLT 286.0 09/25/2016   GLUCOSE 180 (H) 09/25/2016   CHOL 228 (H) 09/25/2016   TRIG 233.0 (H) 09/25/2016   HDL 51.80 09/25/2016   LDLDIRECT 146.0 09/25/2016   LDLCALC 117 (H) 04/25/2016   ALT 31 09/25/2016   AST 18 09/25/2016   NA 136 09/25/2016   K 3.9 09/25/2016   CL 103 09/25/2016   CREATININE 0.76 09/25/2016   BUN 14 09/25/2016   CO2 26  09/25/2016   TSH 2.89 04/25/2016   INR 1.1 07/30/2013   HGBA1C 8.8 (H) 09/25/2016   MICROALBUR 1.5 09/25/2016    Lab Results  Component Value Date   TSH 2.89 04/25/2016   Lab Results  Component Value Date   WBC 5.8 09/25/2016   HGB 15.7 09/25/2016   HCT 46.9 09/25/2016   MCV 87.4 09/25/2016   PLT 286.0 09/25/2016   Lab Results  Component Value Date   NA 136 09/25/2016   K 3.9 09/25/2016   CO2 26 09/25/2016   GLUCOSE 180 (H) 09/25/2016   BUN 14 09/25/2016   CREATININE 0.76 09/25/2016   BILITOT 0.4 09/25/2016   ALKPHOS 73 09/25/2016   AST 18 09/25/2016   ALT 31 09/25/2016   PROT 7.0 09/25/2016   ALBUMIN 4.2 09/25/2016   CALCIUM 9.9 09/25/2016   ANIONGAP 10 02/10/2016   GFR 113.87 09/25/2016   Lab Results  Component Value Date   CHOL 228 (H) 09/25/2016   Lab Results  Component Value Date   HDL 51.80 09/25/2016   Lab Results  Component Value Date   LDLCALC 117 (H) 04/25/2016   Lab Results  Component Value Date   TRIG 233.0 (H) 09/25/2016   Lab Results  Component Value Date   CHOLHDL 4 09/25/2016   Lab Results  Component Value Date   HGBA1C 8.8 (H) 09/25/2016       Assessment & Plan:   Problem List Items Addressed This Visit      Unprioritized   Chest pain - Primary    ekg no change If pain returns-- go to ER      Relevant Orders   EKG 12-Lead (Completed)   DM type 2, uncontrolled, 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) (Chronic)    Check labs con't meds       Relevant Medications   metFORMIN (GLUCOPHAGE) 1000 MG tablet   liraglutide (VICTOZA) 18 MG/3ML SOPN   glipiZIDE (GLUCOTROL) 10 MG tablet   HTN (hypertension) (Chronic)    Uncontrolled Restart all meds--- discussed importance of taking all meds  diovan restarted  rto 2-3 weeks        Relevant Medications   metoprolol succinate (TOPROL-XL) 100 MG 24 hr tablet   furosemide (LASIX) 40 MG tablet   amLODipine (NORVASC) 10 MG tablet   Other Relevant Orders   Comprehensive metabolic panel  (Completed)   CBC (Completed)   Hyperlipemia (Chronic)    Check labs   con't meds      Relevant Medications   metoprolol succinate (TOPROL-XL) 100 MG 24 hr tablet   furosemide (LASIX) 40 MG tablet   amLODipine (NORVASC) 10 MG tablet   Other Relevant Orders   Lipid panel (Completed)   LDL cholesterol, direct (Completed)   Morbid obesity (HCC)    Discussed diet and exercise      Relevant Medications   metFORMIN (GLUCOPHAGE) 1000 MG tablet   liraglutide (VICTOZA) 18 MG/3ML SOPN   glipiZIDE (GLUCOTROL) 10 MG tablet    Other Visit Diagnoses    Shortness of breath       Relevant Orders   EKG 12-Lead (Completed)   DG Chest 2 View (Completed)   DM (diabetes mellitus) type II uncontrolled, periph vascular disorder (HCC)       Relevant Medications   metoprolol succinate (TOPROL-XL) 100 MG 24 hr tablet   metFORMIN (GLUCOPHAGE) 1000 MG tablet   liraglutide (VICTOZA) 18 MG/3ML SOPN   glipiZIDE (GLUCOTROL) 10 MG tablet   furosemide (LASIX) 40 MG tablet   amLODipine (NORVASC) 10 MG tablet   Other Relevant Orders   Hemoglobin A1c (Completed)   Microalbumin / creatinine urine ratio (Completed)   Urinalysis (Completed)   Bilateral edema of lower extremity       Relevant Medications   furosemide (LASIX) 40 MG tablet   Need for hepatitis C screening test       Relevant Orders   Hepatitis C antibody (Completed)   Generalized anxiety disorder       Relevant Medications   escitalopram (LEXAPRO) 20 MG tablet   Pansinusitis, unspecified chronicity       Relevant Medications   amoxicillin-clavulanate (AUGMENTIN) 875-125 MG tablet   fluticasone (FLONASE) 50 MCG/ACT nasal spray      I have discontinued Mr. Campi diclofenac, diclofenac, and amoxicillin. I am also having him start on amoxicillin-clavulanate. Additionally, I am having him maintain his B-D ULTRAFINE III SHORT PEN, nortriptyline, traMADol, liraglutide, metoprolol succinate, metFORMIN, liraglutide, glipiZIDE, furosemide,  amLODipine, fluticasone, and escitalopram.  Meds ordered this encounter  Medications  . metoprolol succinate (TOPROL-XL) 100 MG 24 hr tablet    Sig: Take 1 tablet (100 mg total) by mouth daily. Take with or immediately following a meal.    Dispense:  30 tablet    Refill:  2  . metFORMIN (GLUCOPHAGE) 1000 MG tablet    Sig: Take 1 tablet (1,000 mg total) by mouth 2 (two) times daily with a meal.    Dispense:  60 tablet    Refill:  2  . liraglutide (VICTOZA) 18 MG/3ML SOPN    Sig: 1.8 mg sq qd    Dispense:  6 mL    Refill:  0  . glipiZIDE (GLUCOTROL) 10 MG tablet    Sig: Take 1 tablet (10 mg total) by mouth 2 (two) times daily before a meal.    Dispense:  60 tablet    Refill:  2  . furosemide (LASIX) 40 MG tablet    Sig: Take 1 tablet (40 mg total) by mouth 2 (two) times daily.    Dispense:  60 tablet  Refill:  2  . amLODipine (NORVASC) 10 MG tablet    Sig: Take 1 tablet (10 mg total) by mouth daily.    Dispense:  30 tablet    Refill:  0    Requested drug refills are authorized, however, the patient needs further evaluation and/or laboratory testing before further refills are given. Ask him to make an appointment for this.  [name] Kitchen DISCONTD: fluticasone (FLONASE) 50 MCG/ACT nasal spray    Sig: Place 2 sprays into both nostrils daily.    Dispense:  16 g    Refill:  0  . amoxicillin-clavulanate (AUGMENTIN) 875-125 MG tablet    Sig: Take 1 tablet by mouth 2 (two) times daily.    Dispense:  20 tablet    Refill:  0  . fluticasone (FLONASE) 50 MCG/ACT nasal spray    Sig: Place 2 sprays into both nostrils daily.    Dispense:  16 g    Refill:  0  . escitalopram (LEXAPRO) 20 MG tablet    Sig: Take 1 tablet (20 mg total) by mouth daily.    Dispense:  30 tablet    Refill:  2   . CMA served as Education administrator during this visit. History, Physical and Plan performed by medical provider. Documentation and orders reviewed and attested to.   [name], DO

## 2016-09-25 NOTE — Patient Instructions (Signed)
Carbohydrate Counting for Diabetes Mellitus, Adult Carbohydrate counting is a method for keeping track of how many carbohydrates you eat. Eating carbohydrates naturally increases the amount of sugar (glucose) in the blood. Coun Nonspecific Chest Pain Chest pain can be caused by many different conditions. There is always a chance that your pain could be related to something serious, such as a heart attack or a blood clot in your lungs. Chest pain can also be caused by conditions that are not life-threatening. If you have chest pain, it is very important to follow up with your health care provider. What are the causes? Causes of this condition include:  Heartburn.  Pneumonia or bronchitis.  Anxiety or stress.  Inflammation around your heart (pericarditis) or lung (pleuritis or pleurisy).  A blood clot in your lung.  A collapsed lung (pneumothorax). This can develop suddenly on its own (spontaneous pneumothorax) or from trauma to the chest.  Shingles infection (varicella-zoster virus).  Heart attack.  Damage to the bones, muscles, and cartilage that make up your chest wall. This can include:  Bruised bones due to injury.  Strained muscles or cartilage due to frequent or repeated coughing or overwork.  Fracture to one or more ribs.  Sore cartilage due to inflammation (costochondritis). What increases the risk? Risk factors for this condition may include:  Activities that increase your risk for trauma or injury to your chest.  Respiratory infections or conditions that cause frequent coughing.  Medical conditions or overeating that can cause heartburn.  Heart disease or family history of heart disease.  Conditions or health behaviors that increase your risk of developing a blood clot.  Having had chicken pox (varicella zoster). What are the signs or symptoms? Chest pain can feel like:  Burning or tingling on the surface of your chest or deep in your chest.  Crushing,  pressure, aching, or squeezing pain.  Dull or sharp pain that is worse when you move, cough, or take a deep breath.  Pain that is also felt in your back, neck, shoulder, or arm, or pain that spreads to any of these areas. Your chest pain may come and go, or it may stay constant. How is this diagnosed? Lab tests or other studies may be needed to find the cause of your pain. Your health care provider may have you take a test called an ECG (electrocardiogram). An ECG records your heartbeat patterns at the time the test is performed. You may also have other tests, such as:  Transthoracic echocardiogram (TTE). In this test, sound waves are used to create a picture of the heart structures and to look at how blood flows through your heart.  Transesophageal echocardiogram (TEE).This is a more advanced imaging test that takes images from inside your body. It allows your health care provider to see your heart in finer detail.  Cardiac monitoring. This allows your health care provider to monitor your heart rate and rhythm in real time.  Holter monitor. This is a portable device that records your heartbeat and can help to diagnose abnormal heartbeats. It allows your health care provider to track your heart activity for several days, if needed.  Stress tests. These can be done through exercise or by taking medicine that makes your heart beat more quickly.  Blood tests.  Other imaging tests. How is this treated? Treatment depends on what is causing your chest pain. Treatment may include:  Medicines. These may include:  Acid blockers for heartburn.  Anti-inflammatory medicine.  Pain medicine for inflammatory  conditions.  Antibiotic medicine, if an infection is present.  Medicines to dissolve blood clots.  Medicines to treat coronary artery disease (CAD).  Supportive care for conditions that do not require medicines. This may include:  Resting.  Applying heat or cold packs to injured  areas.  Limiting activities until pain decreases. Follow these instructions at home: Medicines  If you were prescribed an antibiotic, take it as told by your health care provider. Do not stop taking the antibiotic even if you start to feel better.  Take over-the-counter and prescription medicines only as told by your health care provider. Lifestyle  Do not use any products that contain nicotine or tobacco, such as cigarettes and e-cigarettes. If you need help quitting, ask your health care provider.  Do not drink alcohol.  Make lifestyle changes as directed by your health care provider. These may include:  Getting regular exercise. Ask your health care provider to suggest some activities that are safe for you.  Eating a heart-healthy diet. A registered dietitian can help you to learn healthy eating options.  Maintaining a healthy weight.  Managing diabetes, if necessary.  Reducing stress, such as with yoga or relaxation techniques. General instructions  Avoid any activities that bring on chest pain.  If heartburn is the cause for your chest pain, raise (elevate) the head of your bed about 6 inches (15 cm) by putting blocks under the legs. Sleeping with more pillows does not effectively relieve heartburn because it only changes the position of your head.  Keep all follow-up visits as told by your health care provider. This is important. This includes any further testing if your chest pain does not go away. Contact a health care provider if:  Your chest pain does not go away.  You have a rash with blisters on your chest.  You have a fever.  You have chills. Get help right away if:  Your chest pain is worse.  You have a cough that gets worse, or you cough up blood.  You have severe pain in your abdomen.  You have severe weakness.  You faint.  You have sudden, unexplained chest discomfort.  You have sudden, unexplained discomfort in your arms, back, neck, or  jaw.  You have shortness of breath at any time.  You suddenly start to sweat, or your skin gets clammy.  You feel nauseous or you vomit.  You suddenly feel light-headed or dizzy.  Your heart begins to beat quickly, or it feels like it is skipping beats. These symptoms may represent a serious problem that is an emergency. Do not wait to see if the symptoms will go away. Get medical help right away. Call your local emergency services (911 in the [place] Do not drive yourself to the hospital.  This information is not intended to replace advice given to you by your health care provider. Make sure you discuss any questions you have with your health care provider. Document Released: 05/16/2005 Document Revised: 04/30/2016 Document Reviewed: 04/30/2016 Elsevier Interactive Patient Education  2017 [employer] how many carbohydrates you eat helps keep your blood glucose within normal limits, which helps you manage your diabetes (diabetes mellitus). It is important to know how many carbohydrates you can safely have in each meal. This is different for every person. A diet and nutrition specialist (registered dietitian) can help you make a meal plan and calculate how many carbohydrates you should have at each meal and snack. Carbohydrates are found in the following foods:  Grains,  such as breads and cereals.  Dried beans and soy products.  Starchy vegetables, such as potatoes, peas, and corn.  Fruit and fruit juices.  Milk and yogurt.  Sweets and snack foods, such as cake, cookies, candy, chips, and soft drinks. How do I count carbohydrates? There are two ways to count carbohydrates in food. You can use either of the methods or a combination of both. Reading "Nutrition Facts" on packaged food  The "Nutrition Facts" list is included on the labels of almost all packaged foods and beverages in the [place] It includes:  The serving size.  Information about nutrients in each serving,  including the grams (g) of carbohydrate per serving. To use the "Nutrition Facts":  Decide how many servings you will have.  Multiply the number of servings by the number of carbohydrates per serving.  The resulting number is the total amount of carbohydrates that you will be having. Learning standard serving sizes of other foods  When you eat foods containing carbohydrates that are not packaged or do not include "Nutrition Facts" on the label, you need to measure the servings in order to count the amount of carbohydrates:  Measure the foods that you will eat with a food scale or measuring cup, if needed.  Decide how many standard-size servings you will eat.  Multiply the number of servings by 15. Most carbohydrate-rich foods have about 15 g of carbohydrates per serving.  For example, if you eat 8 oz (170 g) of strawberries, you will have eaten 2 servings and 30 g of carbohydrates (2 servings x 15 g = 30 g).  For foods that have more than one food mixed, such as soups and casseroles, you must count the carbohydrates in each food that is included. The following list contains standard serving sizes of common carbohydrate-rich foods. Each of these servings has about 15 g of carbohydrates:   hamburger bun or  English muffin.   oz (15 mL) syrup.   oz (14 g) jelly.  1 slice of bread.  1 six-inch tortilla.  3 oz (85 g) cooked rice or pasta.  4 oz (113 g) cooked dried beans.  4 oz (113 g) starchy vegetable, such as peas, corn, or potatoes.  4 oz (113 g) hot cereal.  4 oz (113 g) mashed potatoes or  of a large baked potato.  4 oz (113 g) canned or frozen fruit.  4 oz (120 mL) fruit juice.  4-6 crackers.  6 chicken nuggets.  6 oz (170 g) unsweetened dry cereal.  6 oz (170 g) plain fat-free yogurt or yogurt sweetened with artificial sweeteners.  8 oz (240 mL) milk.  8 oz (170 g) fresh fruit or one small piece of fruit.  24 oz (680 g) popped popcorn. Example of  carbohydrate counting Sample meal  3 oz (85 g) chicken breast.  6 oz (170 g) brown rice.  4 oz (113 g) corn.  8 oz (240 mL) milk.  8 oz (170 g) strawberries with sugar-free whipped topping. Carbohydrate calculation 1. Identify the foods that contain carbohydrates:  Rice.  Corn.  Milk.  Strawberries. 2. Calculate how many servings you have of each food:  2 servings rice.  1 serving corn.  1 serving milk.  1 serving strawberries. 3. Multiply each number of servings by 15 g:  2 servings rice x 15 g = 30 g.  1 serving corn x 15 g = 15 g.  1 serving milk x 15 g = 15 g.  1  serving strawberries x 15 g = 15 g. 4. Add together all of the amounts to find the total grams of carbohydrates eaten:  30 g + 15 g + 15 g + 15 g = 75 g of carbohydrates total. This information is not intended to replace advice given to you by your health care provider. Make sure you discuss any questions you have with your health care provider. Document Released: 08/06/2005 Document Revised: 02/24/2016 Document Reviewed: 01/18/2016 Elsevier Interactive Patient Education  2017 [employer].

## 2016-09-25 NOTE — Assessment & Plan Note (Signed)
Uncontrolled Restart all meds--- discussed importance of taking all meds  diovan restarted rto 2-3 weeks

## 2016-09-26 DIAGNOSIS — J324 Chronic pansinusitis: Secondary | ICD-10-CM | POA: Insufficient documentation

## 2016-09-26 MED ORDER — ESCITALOPRAM OXALATE 20 MG PO TABS
20.0000 mg | ORAL_TABLET | Freq: Every day | ORAL | 2 refills | Status: DC
Start: 2016-09-26 — End: 2017-05-02

## 2016-09-26 NOTE — Assessment & Plan Note (Signed)
abx per orders flonase and antihistamine rto prn  

## 2016-09-28 ENCOUNTER — Ambulatory Visit (INDEPENDENT_AMBULATORY_CARE_PROVIDER_SITE_OTHER): Payer: BLUE CROSS/BLUE SHIELD | Admitting: Family Medicine

## 2016-09-28 ENCOUNTER — Telehealth: Payer: Self-pay | Admitting: Family Medicine

## 2016-09-28 ENCOUNTER — Encounter: Payer: Self-pay | Admitting: Family Medicine

## 2016-09-28 VITALS — BP 140/70 | HR 83 | Temp 97.5°F | Ht 71.0 in | Wt 357.8 lb

## 2016-09-28 DIAGNOSIS — I10 Essential (primary) hypertension: Secondary | ICD-10-CM | POA: Diagnosis not present

## 2016-09-28 LAB — BASIC METABOLIC PANEL WITH GFR
BUN: 21 mg/dL (ref 7–25)
CO2: 21 mmol/L (ref 20–31)
Calcium: 9.3 mg/dL (ref 8.6–10.3)
Chloride: 103 mmol/L (ref 98–110)
Creat: 0.9 mg/dL (ref 0.70–1.33)
GFR, Est African American: >89 mL/min (ref >=60)
GFR, Est Non African American: >89 mL/min (ref >=60)
Glucose, Bld: 211 mg/dL — ABNORMAL HIGH (ref 65–99)
Potassium: 3.9 mmol/L (ref 3.5–5.3)
Sodium: 136 mmol/L (ref 135–146)

## 2016-09-28 MED ORDER — SPIRONOLACTONE 25 MG PO TABS: 25.0000 mg | ORAL_TABLET | Freq: Every day | ORAL | 1 refills | Status: DC

## 2016-09-28 NOTE — Telephone Encounter (Signed)
Patient called stating that he is experiencing increased blood pressures. Patient states that @ 7:00 A.M. His BP was 191/111 and @ 9:15 it was 194/110. Transferred to [facility] and spoke with Cecille Rubin.

## 2016-09-28 NOTE — Telephone Encounter (Signed)
[facility] Day - Client TELEPHONE ADVICE RECORD [facility] Patient Name: Tyler Brewer DOB: [birth year] Initial Comment Caller states having high bp. It was 191/111 at 7 this morning, a few min ago 194/110. He is having a headache, very tired and some shortness of breath Nurse Assessment Nurse: Markus Daft, RN, Sherre Poot Date/Time (Eastern Time): 09/28/2016 9:26:47 AM Confirm and document reason for call. If symptomatic, describe symptoms. ---Caller states having high bp. It was 191/111 at 7 this morning. A few min ago 194/110. He is having a headache - rates 8/10, very tired, and some shortness of breath Does the patient have any new or worsening symptoms? ---Yes Will a triage be completed? ---Yes Related visit to physician within the last 2 weeks? ---Yes Does the PT have any chronic conditions? (i.e. diabetes, asthma, etc.) ---Yes List chronic conditions. ---HTN Is this a behavioral health or substance abuse call? ---No Guidelines Guideline Title Affirmed Question Affirmed Notes Headache [1] SEVERE headache (e.g., excruciating) AND [2] not improved after 2 hours of pain medicine Final Disposition User See Physician within 4 Hours (or PCP triage) Markus Daft, RN, Windy Comments The earliest pt can be seen is at 4 pm. Appt made with DR. Wendling at 4 pm. He understands RN's advise is not to wait. Referrals REFERRED TO PCP OFFICE Disagree/Comply: Disagree Disagree/Comply Reason: Disagree with instructions

## 2016-09-28 NOTE — Progress Notes (Signed)
Pre visit review using our clinic review tool, if applicable. No additional management support is needed unless otherwise documented below in the visit note. 

## 2016-09-28 NOTE — Progress Notes (Signed)
Chief Complaint  Patient presents with  . Hypertension    pt states he can feel pressure in his head,also having chest pain(comes and goes).    Subjective Tyler Brewer is a 56 y.o. male who presents for hypertension follow up. He does monitor home blood pressures. Blood pressures ranging from high 100's/90's on average since taking them starting yesterday. He is compliant with medications- Diovan 160 mg daily, Norvasc 10 mg daily, Metoprolol XL 100 mg daily. Diovan was started on Tuesday. He is having a throbbing headache that he gets when his BP is elevated. He is having some CP, but this is not new and feels like gas pains. He will intermittently get exertional chest pain, but nothing new and he had a neg cath in the past. Stress test in 04/2016 that was neg.     Past Medical History:  Diagnosis Date  . CAD (coronary artery disease)  cardiac cath with moderate disease in a septal branch of the ramus intermedius 04/01/2012  . Depression   . Diabetes mellitus    poorly controlled by his report  . History of narcotic addiction ([place])    past history of back pain  . Hypercholesteremia   . Hypertension   . IBS (irritable bowel syndrome)   . Obesity    Max weight was 390  . OSA on CPAP   . Panic attacks   . Testosterone deficiency   . Vertigo    Family History  Problem Relation Age of Onset  . Diabetes type II Father   . Hypertension Father   . Pancreatic disease Father 76    Deceased  . Healthy Mother   . Healthy Sister   . Healthy Son   . Healthy Daughter    Medications Current Outpatient Prescriptions on File Prior to Visit  Medication Sig Dispense Refill  . amLODipine (NORVASC) 10 MG tablet Take 1 tablet (10 mg total) by mouth daily. 30 tablet 0  . amoxicillin-clavulanate (AUGMENTIN) 875-125 MG tablet Take 1 tablet by mouth 2 (two) times daily. 20 tablet 0  . B-D ULTRAFINE III SHORT PEN 31G X 8 MM MISC USE WITH VICTOZA AS INSTRUCTED 100 each 0  . fluticasone  (FLONASE) 50 MCG/ACT nasal spray Place 2 sprays into both nostrils daily. 16 g 0  . furosemide (LASIX) 40 MG tablet Take 1 tablet (40 mg total) by mouth 2 (two) times daily. 60 tablet 2  . glipiZIDE (GLUCOTROL) 10 MG tablet Take 1 tablet (10 mg total) by mouth 2 (two) times daily before a meal. 60 tablet 2  . liraglutide (VICTOZA) 18 MG/3ML SOPN Inject 1.8 mg under the skin daily 6 mL 2  . liraglutide (VICTOZA) 18 MG/3ML SOPN 1.8 mg sq qd 6 mL 0  . metFORMIN (GLUCOPHAGE) 1000 MG tablet Take 1 tablet (1,000 mg total) by mouth 2 (two) times daily with a meal. 60 tablet 2  . metoprolol succinate (TOPROL-XL) 100 MG 24 hr tablet Take 1 tablet (100 mg total) by mouth daily. Take with or immediately following a meal. 30 tablet 2  . nortriptyline (PAMELOR) 10 MG capsule Take 2 capsules (20 mg total) by mouth at bedtime. 60 capsule 3  . traMADol (ULTRAM) 50 MG tablet Take 1 tablet (50 mg total) by mouth every 8 (eight) hours as needed. 30 tablet 0  . valsartan (DIOVAN) 160 MG tablet Take 1 tablet (160 mg total) by mouth daily. 30 tablet 2  . escitalopram (LEXAPRO) 20 MG tablet Take 1 tablet (20 mg total)  by mouth daily. (Patient not taking: Reported on 09/28/2016) 30 tablet 2   Allergies No Known Allergies  Review of Systems Cardiovascular: As noted in HPI Respiratory:  no shortness of breath  Exam BP 140/70 (BP Location: Left Arm, Patient Position: Sitting, Cuff Size: Large)   Pulse 83   Temp 97.5 F (36.4 C) (Oral)   Ht 5\' 11"  (1.803 m)   Wt (!) 357 lb 12.8 oz (162.3 kg)   SpO2 97%   BMI 49.90 kg/m  General:  well developed, well nourished, in no apparent distress Skin:  warm, no pallor or diaphoresis Eyes:  pupils equal and round, sclera anicteric without injection Heart :RRR, no murmurs, no bruits, no LE edema Lungs:  clear to auscultation, no accessory muscle use Psych: well oriented with normal range of affect and appropriate judgment/insight  HTN (hypertension), benign - Plan:  spironolactone (ALDACTONE) 25 MG tablet, BASIC METABOLIC PANEL WITH GFR, Basic Metabolic Panel (BMET)  Orders as above. Pt brought in BP cuff to compare to ours. There is a significant difference between his home readings and our reading today. Readings are similar and improved from earlier.  Given symptoms associated with BP and BP today, will add Spironolactone.  Counsled on diet and exercise F/u in 1 week for labs, 2 weeks with PCP to recheck BP. The patient voiced understanding and agreement to the plan.  [place], DO 09/28/16  4:50 PM

## 2016-09-28 NOTE — Patient Instructions (Signed)
Around 3 times per week, check your blood pressure 4 times per day. Twice in the morning and twice in the evening. The readings should be at least one minute apart. Write down these values and bring them to your next nurse visit/appointment.  When you check your BP, make sure you have been doing something calm/relaxing 5 minutes prior to checking. Both feet should be flat on the floor and you should be sitting. Use your left arm and make sure it is in a relaxed position (on a table), and that the cuff is at the approximate level/height of your heart.  

## 2016-10-01 ENCOUNTER — Encounter: Payer: Self-pay | Admitting: Family Medicine

## 2016-10-01 ENCOUNTER — Telehealth: Payer: Self-pay

## 2016-10-01 NOTE — Telephone Encounter (Signed)
Was seen by provider on 09/28/16

## 2016-10-01 NOTE — Telephone Encounter (Signed)
CAlled patient regarding labwork. Appointment scheduled for BP follow up. Patient to return on Monday to see Dr. Nani Ravens for follow up.

## 2016-10-03 ENCOUNTER — Encounter: Payer: Self-pay | Admitting: Family Medicine

## 2016-10-04 ENCOUNTER — Encounter (HOSPITAL_BASED_OUTPATIENT_CLINIC_OR_DEPARTMENT_OTHER): Payer: Self-pay | Admitting: *Deleted

## 2016-10-04 ENCOUNTER — Encounter: Payer: Self-pay | Admitting: Family Medicine

## 2016-10-04 ENCOUNTER — Emergency Department (HOSPITAL_BASED_OUTPATIENT_CLINIC_OR_DEPARTMENT_OTHER)
Admission: EM | Admit: 2016-10-04 | Discharge: 2016-10-04 | Disposition: A | Discharge status: Home / Self Care | Payer: PRIVATE HEALTH INSURANCE | Attending: Emergency Medicine | Admitting: Emergency Medicine

## 2016-10-04 DIAGNOSIS — E119 Type 2 diabetes mellitus without complications: Secondary | ICD-10-CM | POA: Diagnosis not present

## 2016-10-04 DIAGNOSIS — R200 Anesthesia of skin: Secondary | ICD-10-CM | POA: Insufficient documentation

## 2016-10-04 DIAGNOSIS — R0602 Shortness of breath: Secondary | ICD-10-CM | POA: Diagnosis not present

## 2016-10-04 DIAGNOSIS — Z79899 Other long term (current) drug therapy: Secondary | ICD-10-CM | POA: Insufficient documentation

## 2016-10-04 DIAGNOSIS — R0789 Other chest pain: Secondary | ICD-10-CM | POA: Insufficient documentation

## 2016-10-04 DIAGNOSIS — Z7984 Long term (current) use of oral hypoglycemic drugs: Secondary | ICD-10-CM | POA: Insufficient documentation

## 2016-10-04 DIAGNOSIS — Z87891 Personal history of nicotine dependence: Secondary | ICD-10-CM | POA: Insufficient documentation

## 2016-10-04 DIAGNOSIS — I10 Essential (primary) hypertension: Secondary | ICD-10-CM | POA: Diagnosis not present

## 2016-10-04 DIAGNOSIS — I2510 Atherosclerotic heart disease of native coronary artery without angina pectoris: Secondary | ICD-10-CM | POA: Insufficient documentation

## 2016-10-04 LAB — CBC
HCT: 43.9 % (ref 39.0–52.0)
Hemoglobin: 15 g/dL (ref 13.0–17.0)
MCH: 29.6 pg (ref 26.0–34.0)
MCHC: 34.2 g/dL (ref 30.0–36.0)
MCV: 86.6 fL (ref 78.0–100.0)
Platelets: 238 10*3/uL (ref 150–400)
RBC: 5.07 MIL/uL (ref 4.22–5.81)
RDW: 13.5 % (ref 11.5–15.5)
WBC: 5.9 10*3/uL (ref 4.0–10.5)

## 2016-10-04 LAB — TROPONIN I: Troponin I: <0.03 ng/mL (ref <0.03)

## 2016-10-04 LAB — BASIC METABOLIC PANEL
Anion gap: 9 (ref 5–15)
BUN: 17 mg/dL (ref 6–20)
CO2: 22 mmol/L (ref 22–32)
Calcium: 9.1 mg/dL (ref 8.9–10.3)
Chloride: 104 mmol/L (ref 101–111)
Creatinine, Ser: 0.83 mg/dL (ref 0.61–1.24)
GFR calc Af Amer: >60 mL/min (ref >60)
GFR calc non Af Amer: >60 mL/min (ref >60)
Glucose, Bld: 240 mg/dL — ABNORMAL HIGH (ref 65–99)
Potassium: 4 mmol/L (ref 3.5–5.1)
Sodium: 135 mmol/L (ref 135–145)

## 2016-10-04 NOTE — ED Notes (Signed)
Pt ambulatory without difficulty at discharge 

## 2016-10-04 NOTE — Discharge Instructions (Signed)
Go to your scheduled appointment next week to see her primary care provider. Continue taking your medications as prescribed. Maintain low salt diet and drink at least 8 glasses of water a day.   Get help right away if: You develop a severe headache or confusion. You have unusual weakness, numbness, or feel faint. You have severe chest or abdominal pain. You vomit repeatedly. You have trouble breathing.

## 2016-10-04 NOTE — ED Notes (Signed)
ED Provider at bedside. 

## 2016-10-04 NOTE — ED Provider Notes (Signed)
[facility] Provider Note   CSN: [id number] Arrival date & time: 10/04/16  0844     History   Chief Complaint Chief Complaint  Patient presents with  . Hypertension    HPI Tyler Brewer is a 56 y.o. male with history of HTN, Dm not on insulin, CAD, cardiac cath 2013, obesity, OSA on CPAP, panic attacks presents today with complains of hypertension that has been "unsteady", "going up and down" throughout the day, unchanged for one month. He reports associated chest tightness, generalized weakness, headaches. He describes his chest tightness as dull, localized, not associated activity or food, 6/10.  He reports seeing his primary twice last week, started on a new BP medication, and was recently diagnosed with psoriatic arthritis. He reports calling his primary yesterday stating that he had high blood pressure and headaches and was called back this morning to come to Ed. He states he's tried tylenol and aleve with no relief.  He denies any nausea, vomiting, changes in vision, changes in gait, fever, chills. He reports increased stress at work where he works at a [employer]. He reports taking all his medications as prescribed.   The history is provided by the patient. No language interpreter was used.    Past Medical History:  Diagnosis Date  . CAD (coronary artery disease)  cardiac cath with moderate disease in a septal branch of the ramus intermedius 04/01/2012  . Depression   . Diabetes mellitus    poorly controlled by his report  . History of narcotic addiction ([place])    past history of back pain  . Hypercholesteremia   . Hypertension   . IBS (irritable bowel syndrome)   . Obesity    Max weight was 390  . OSA on CPAP   . Panic attacks   . Testosterone deficiency   . Vertigo     Patient Active Problem List   Diagnosis Date Noted  . Pansinusitis 09/26/2016  . Diabetic polyneuropathy associated with diabetes mellitus due to underlying condition ([place])  05/08/2016  . Methamphetamine use disorder, severe, dependence ([place]) 02/11/2016  . Substance induced mood disorder ([place]) 02/11/2016  . Exertional dyspnea 11/30/2015  . ADD (attention deficit disorder) 09/29/2015  . Binge eating 09/29/2015  . Syncope 05/05/2012  . Diarrhea 05/05/2012  . Panic attacks 05/05/2012  . OSA (obstructive sleep apnea) 05/05/2012  . HTN (hypertension) 05/05/2012  . Diabetes mellitus type II 05/05/2012  . Dyslipidemia 05/05/2012  . CAD (coronary artery disease)  cardiac cath with moderate disease in a septal branch of the ramus intermedius 04/01/2012  . Chest pain 04/01/2012  . Drug abuse and dependence ([place]) 04/01/2012  . Family history of coronary artery disease 03/31/2012  . Sleep apnea, on C-pap 03/31/2012  . Hyperlipemia 03/30/2012  . HTN (hypertension), benign 03/30/2012  . Morbid obesity ([place]) 03/30/2012  . DM type 2, uncontrolled, with neuropathy ([place]) 03/30/2012  . Depression with anxiety 03/30/2012    Past Surgical History:  Procedure Laterality Date  . CARDIAC CATHETERIZATION    . LEFT HEART CATHETERIZATION WITH CORONARY ANGIOGRAM N/A 03/31/2012   Procedure: LEFT HEART CATHETERIZATION WITH CORONARY ANGIOGRAM;  Surgeon: Leonie Man, MD;  Location: [facility] CATH LAB;  Service: Cardiovascular;  Laterality: N/A;       Home Medications    Prior to Admission medications   Medication Sig Start Date End Date Taking? Authorizing Provider  amLODipine (NORVASC) 10 MG tablet Take 1 tablet (10 mg total) by mouth daily. 09/25/16  Yes Rosalita Chessman  Chase, DO  amoxicillin-clavulanate (AUGMENTIN) 875-125 MG tablet Take 1 tablet by mouth 2 (two) times daily. 09/25/16  Yes Yvonne R Lowne Chase, DO  B-D ULTRAFINE III SHORT PEN 31G X 8 MM MISC USE WITH VICTOZA AS INSTRUCTED 06/25/16  Yes Yvonne R Lowne Chase, DO  escitalopram (LEXAPRO) 20 MG tablet Take 1 tablet (20 mg total) by mouth daily. 09/26/16  Yes Yvonne R Lowne Chase, DO  fluticasone (FLONASE) 50 MCG/ACT nasal  spray Place 2 sprays into both nostrils daily. 09/25/16  Yes Yvonne R Lowne Chase, DO  folic acid (FOLVITE) 1 MG tablet Take 1 mg by mouth daily.   Yes Historical Provider, MD  furosemide (LASIX) 40 MG tablet Take 1 tablet (40 mg total) by mouth 2 (two) times daily. 09/25/16  Yes Yvonne R Lowne Chase, DO  glipiZIDE (GLUCOTROL) 10 MG tablet Take 1 tablet (10 mg total) by mouth 2 (two) times daily before a meal. 09/25/16  Yes Rosalita Chessman Chase, DO  liraglutide (VICTOZA) 18 MG/3ML SOPN Inject 1.8 mg under the skin daily 09/14/16  Yes Yvonne R Lowne Chase, DO  liraglutide (VICTOZA) 18 MG/3ML SOPN 1.8 mg sq qd 09/25/16  Yes Yvonne R Lowne Chase, DO  metFORMIN (GLUCOPHAGE) 1000 MG tablet Take 1 tablet (1,000 mg total) by mouth 2 (two) times daily with a meal. 09/25/16  Yes Rosalita Chessman Chase, DO  metoprolol succinate (TOPROL-XL) 100 MG 24 hr tablet Take 1 tablet (100 mg total) by mouth daily. Take with or immediately following a meal. 09/25/16  Yes Yvonne R Lowne Chase, DO  nortriptyline (PAMELOR) 10 MG capsule Take 2 capsules (20 mg total) by mouth at bedtime. 07/03/16  Yes Edward Saguier, PA-C  spironolactone (ALDACTONE) 25 MG tablet Take 1 tablet (25 mg total) by mouth daily. 09/28/16  Yes Crosby Oyster Wendling, DO  traMADol (ULTRAM) 50 MG tablet Take 1 tablet (50 mg total) by mouth every 8 (eight) hours as needed. 09/04/16  Yes Yvonne R Lowne Chase, DO  valsartan (DIOVAN) 160 MG tablet Take 1 tablet (160 mg total) by mouth daily. 09/25/16  Yes Yvonne R Lowne Chase, DO  vitamin B-12 (CYANOCOBALAMIN) 1000 MCG tablet Take 1,000 mcg by mouth daily.   Yes Historical Provider, MD    Family History Family History  Problem Relation Age of Onset  . Diabetes type II Father   . Hypertension Father   . Pancreatic disease Father 35    Deceased  . Healthy Mother   . Healthy Sister   . Healthy Son   . Healthy Daughter     Social History Social History  Substance Use Topics  . Smoking status: Former Smoker    Quit  date: 11/21/2015  . Smokeless tobacco: Never Used  . Alcohol use No     Comment: Rare     Allergies   Patient has no known allergies.   Review of Systems Review of Systems  Constitutional: Negative for chills, diaphoresis and fever.  Eyes: Negative for visual disturbance.  Respiratory: Positive for chest tightness and shortness of breath.   Cardiovascular: Positive for chest pain.  Gastrointestinal: Negative for diarrhea, nausea and vomiting.  Genitourinary: Negative for difficulty urinating and dysuria.  Skin: Negative for rash and wound.  Neurological: Positive for numbness (neuropathy in leg- chronic, unchanged).  All other systems reviewed and are negative.    Physical Exam Updated Vital Signs BP 137/93 (BP Location: Left Arm)   Pulse 75   Temp 97.7 F (36.5 C) (Oral)  Resp 18   Ht 5\' 11"  (1.803 m)   Wt (!) 158.8 kg   SpO2 99%   BMI 48.82 kg/m   Physical Exam  Constitutional: He is oriented to person, place, and time. He appears well-developed and well-nourished.  Well appearing  HENT:  Head: Normocephalic and atraumatic.  Nose: Nose normal.  Mouth/Throat: Oropharynx is clear and moist.  Eyes: Conjunctivae and EOM are normal. Pupils are equal, round, and reactive to light.  Neck: Normal range of motion.  No carotid bruits noted.   Cardiovascular: Normal rate, normal heart sounds and intact distal pulses.   Distal pulses intact.   Pulmonary/Chest: Effort normal and breath sounds normal. No respiratory distress. He has no wheezes. He has no rales. He exhibits no tenderness.  Normal work of breathing. No respiratory distress noted.   Abdominal: Soft. He exhibits no mass. There is no tenderness. There is no rebound and no guarding.  Distended. Non tender, no rebound tenderness or guarding.   Musculoskeletal: Normal range of motion. He exhibits no tenderness.  No chest tenderness.   Neurological: He is alert and oriented to person, place, and time.  Cranial  Nerves:  III,IV, VI: ptosis not present, extra-ocular movements intact bilaterally, direct and consensual pupillary light reflexes intact bilaterally V: facial sensation, jaw opening, and bite strength equal bilaterally VII: eyebrow raise, eyelid close, smile, frown, pucker equal bilaterally VIII: hearing grossly normal bilaterally  IX,X: palate elevation and swallowing intact XI: bilateral shoulder shrug and lateral head rotation equal and strong XII: midline tongue extension  Negative pronator drift, negative Romberg, negative RAM's, negative heel-to-shin, negative finger to nose.    Sensory intact.  Muscle strength 5/5 Patient able to ambulate without difficulty.   Skin: Skin is warm.  Psychiatric: He has a normal mood and affect. His behavior is normal.  Nursing note and vitals reviewed.    ED Treatments / Results  Labs (all labs ordered are listed, but only abnormal results are displayed) Labs Reviewed  BASIC METABOLIC PANEL - Abnormal; Notable for the following:       Result Value   Glucose, Bld 240 (*)    All other components within normal limits  CBC  TROPONIN I    EKG  EKG Interpretation  Date/Time:  Thursday October 04 2016 09:07:07 EST Ventricular Rate:  70 PR Interval:    QRS Duration: 100 QT Interval:  381 QTC Calculation: 412 R Axis:   35 Text Interpretation:  Sinus rhythm Prolonged PR interval Low voltage, precordial leads Confirmed by Alvino Chapel  MD, Ovid Curd [phone]) on 10/04/2016 9:17:53 AM       Radiology No results found.  Procedures Procedures (including critical care time)  Medications Ordered in ED Medications - No data to display   Initial Impression / Assessment and Plan / ED Course  I have reviewed the triage vital signs and the nursing notes.  Pertinent labs & imaging results that were available during my care of the patient were reviewed by me and considered in my medical decision making (see chart for details).    Patient noted to  be hypertensive in the emergency department.  No signs of hypertensive urgency. On exam patient in no apparent distress, afebrile, vital signs stable. Heart and lung sounds clear. abdomen distended and his baseline. No tenderness, rebound, guarding. Normal neuro exam. Finger-nose negative. Heel-to-shin negative. RAMs negative, pronator drift negative. Patient able to ambulate without difficulty. Lab work is reassuring. Troponin is negative. EKG without any acute findings.  I feel  he his safe for discharge at this time. Discussed with patient the need for close follow-up and management by their primary care physician in one week for his already scheduled appointment. Patient is to immediately return to the emergency department discussed. Case discussed with Dr. Alvino Chapel.  Vitals:   10/04/16 0849 10/04/16 1047  BP: 138/89 137/93  Pulse: 73 75  Resp: 20 18  Temp: 97.7 F (36.5 C)   TempSrc: Oral   SpO2: 98% 99%  Weight: (!) 158.8 kg   Height: 5\' 11"  (1.803 m)      Final Clinical Impressions(s) / ED Diagnoses   Final diagnoses:  Hypertension, unspecified type    New Prescriptions New Prescriptions   No medications on file     [place], [place] 10/04/16 [place], [place] 10/04/16 [place], MD 10/04/16 [phone]

## 2016-10-04 NOTE — Telephone Encounter (Signed)
Per Dr. Nani Ravens, If patient was still having chest pain or tightness he should go to the Er.    I spoke with the patient he stated that he was till having chest discomfort, pain and tightness. He also stated his pressure was still elevated to 179/100. Patient was advised to go to the ER with the symptoms he was having, he agreed.  PC

## 2016-10-04 NOTE — ED Triage Notes (Signed)
Pt reports he emailed his doctor yesterday due to elevated bp, chest tightness, and head pressure. He was called by his PCP this morning and instructed to go to ED. Pt was recently dx with psoriatic arthritis and states he has been under increased stress at work

## 2016-10-05 NOTE — Telephone Encounter (Signed)
With his history of drug abuse I prefer not to give him those meds--- he can come in and we can discuss other options unless he is able to get into psych first

## 2016-10-08 NOTE — Telephone Encounter (Signed)
please reach out to this patient and get him scheduled with Dr. Etter Sjogren at his earliest convenience

## 2016-10-10 ENCOUNTER — Ambulatory Visit: Payer: BLUE CROSS/BLUE SHIELD | Admitting: Family Medicine

## 2016-10-16 ENCOUNTER — Ambulatory Visit (INDEPENDENT_AMBULATORY_CARE_PROVIDER_SITE_OTHER): Payer: BLUE CROSS/BLUE SHIELD | Admitting: Family Medicine

## 2016-10-16 ENCOUNTER — Encounter: Payer: Self-pay | Admitting: Family Medicine

## 2016-10-16 VITALS — BP 128/80 | HR 80 | Temp 97.7°F | Resp 18 | Ht 71.0 in | Wt 350.4 lb

## 2016-10-16 DIAGNOSIS — I10 Essential (primary) hypertension: Secondary | ICD-10-CM

## 2016-10-16 DIAGNOSIS — IMO0002 Reserved for concepts with insufficient information to code with codable children: Secondary | ICD-10-CM

## 2016-10-16 DIAGNOSIS — E1165 Type 2 diabetes mellitus with hyperglycemia: Secondary | ICD-10-CM | POA: Diagnosis not present

## 2016-10-16 DIAGNOSIS — E785 Hyperlipidemia, unspecified: Secondary | ICD-10-CM

## 2016-10-16 DIAGNOSIS — E1151 Type 2 diabetes mellitus with diabetic peripheral angiopathy without gangrene: Secondary | ICD-10-CM

## 2016-10-16 DIAGNOSIS — E1169 Type 2 diabetes mellitus with other specified complication: Secondary | ICD-10-CM

## 2016-10-16 DIAGNOSIS — L4050 Arthropathic psoriasis, unspecified: Secondary | ICD-10-CM

## 2016-10-16 MED ORDER — METFORMIN HCL 1000 MG PO TABS: 1000.0000 mg | ORAL_TABLET | Freq: Two times a day (BID) | ORAL | 2 refills | Status: DC

## 2016-10-16 MED ORDER — LIRAGLUTIDE 18 MG/3ML ~~LOC~~ SOPN: PEN_INJECTOR | SUBCUTANEOUS | 2 refills | Status: DC

## 2016-10-16 MED ORDER — NORTRIPTYLINE HCL 10 MG PO CAPS: 20.0000 mg | ORAL_CAPSULE | Freq: Every day | ORAL | 3 refills | Status: DC

## 2016-10-16 MED ORDER — VALSARTAN 160 MG PO TABS: 160.0000 mg | ORAL_TABLET | Freq: Every day | ORAL | 2 refills | Status: DC

## 2016-10-16 MED ORDER — GLIPIZIDE 10 MG PO TABS: 10.0000 mg | ORAL_TABLET | Freq: Two times a day (BID) | ORAL | 2 refills | Status: DC

## 2016-10-16 MED ORDER — AMLODIPINE BESYLATE 10 MG PO TABS: 10.0000 mg | ORAL_TABLET | Freq: Every day | ORAL | 0 refills | Status: DC

## 2016-10-16 MED ORDER — TRAMADOL HCL 50 MG PO TABS: 50.0000 mg | ORAL_TABLET | Freq: Three times a day (TID) | ORAL | 0 refills | Status: DC | PRN

## 2016-10-16 MED ORDER — METOPROLOL SUCCINATE ER 100 MG PO TB24: 100.0000 mg | ORAL_TABLET | Freq: Every day | ORAL | 2 refills | Status: DC

## 2016-10-16 NOTE — Progress Notes (Signed)
Pre visit review using our clinic review tool, if applicable. No additional management support is needed unless otherwise documented below in the visit note. 

## 2016-10-16 NOTE — Assessment & Plan Note (Signed)
STABLE RECHECK 3 MONTHS  CON'T CURRENT MEDS

## 2016-10-16 NOTE — Patient Instructions (Signed)
Carbohydrate Counting for Diabetes Mellitus, Adult Carbohydrate counting is a method for keeping track of how many carbohydrates you eat. Eating carbohydrates naturally increases the amount of sugar (glucose) in the blood. Counting how many carbohydrates you eat helps keep your blood glucose within normal limits, which helps you manage your diabetes (diabetes mellitus). It is important to know how many carbohydrates you can safely have in each meal. This is different for every person. A diet and nutrition specialist (registered dietitian) can help you make a meal plan and calculate how many carbohydrates you should have at each meal and snack. Carbohydrates are found in the following foods:  Grains, such as breads and cereals.  Dried beans and soy products.  Starchy vegetables, such as potatoes, peas, and corn.  Fruit and fruit juices.  Milk and yogurt.  Sweets and snack foods, such as cake, cookies, candy, chips, and soft drinks. How do I count carbohydrates? There are two ways to count carbohydrates in food. You can use either of the methods or a combination of both. Reading "Nutrition Facts" on packaged food  The "Nutrition Facts" list is included on the labels of almost all packaged foods and beverages in the [place] It includes:  The serving size.  Information about nutrients in each serving, including the grams (g) of carbohydrate per serving. To use the "Nutrition Facts":  Decide how many servings you will have.  Multiply the number of servings by the number of carbohydrates per serving.  The resulting number is the total amount of carbohydrates that you will be having. Learning standard serving sizes of other foods  When you eat foods containing carbohydrates that are not packaged or do not include "Nutrition Facts" on the label, you need to measure the servings in order to count the amount of carbohydrates:  Measure the foods that you will eat with a food scale or measuring  cup, if needed.  Decide how many standard-size servings you will eat.  Multiply the number of servings by 15. Most carbohydrate-rich foods have about 15 g of carbohydrates per serving.  For example, if you eat 8 oz (170 g) of strawberries, you will have eaten 2 servings and 30 g of carbohydrates (2 servings x 15 g = 30 g).  For foods that have more than one food mixed, such as soups and casseroles, you must count the carbohydrates in each food that is included. The following list contains standard serving sizes of common carbohydrate-rich foods. Each of these servings has about 15 g of carbohydrates:   hamburger bun or  English muffin.   oz (15 mL) syrup.   oz (14 g) jelly.  1 slice of bread.  1 six-inch tortilla.  3 oz (85 g) cooked rice or pasta.  4 oz (113 g) cooked dried beans.  4 oz (113 g) starchy vegetable, such as peas, corn, or potatoes.  4 oz (113 g) hot cereal.  4 oz (113 g) mashed potatoes or  of a large baked potato.  4 oz (113 g) canned or frozen fruit.  4 oz (120 mL) fruit juice.  4-6 crackers.  6 chicken nuggets.  6 oz (170 g) unsweetened dry cereal.  6 oz (170 g) plain fat-free yogurt or yogurt sweetened with artificial sweeteners.  8 oz (240 mL) milk.  8 oz (170 g) fresh fruit or one small piece of fruit.  24 oz (680 g) popped popcorn. Example of carbohydrate counting Sample meal  3 oz (85 g) chicken breast.  6 oz (  170 g) brown rice.  4 oz (113 g) corn.  8 oz (240 mL) milk.  8 oz (170 g) strawberries with sugar-free whipped topping. Carbohydrate calculation 1. Identify the foods that contain carbohydrates:  Rice.  Corn.  Milk.  Strawberries. 2. Calculate how many servings you have of each food:  2 servings rice.  1 serving corn.  1 serving milk.  1 serving strawberries. 3. Multiply each number of servings by 15 g:  2 servings rice x 15 g = 30 g.  1 serving corn x 15 g = 15 g.  1 serving milk x 15 g = 15  g.  1 serving strawberries x 15 g = 15 g. 4. Add together all of the amounts to find the total grams of carbohydrates eaten:  30 g + 15 g + 15 g + 15 g = 75 g of carbohydrates total. This information is not intended to replace advice given to you by your health care provider. Make sure you discuss any questions you have with your health care provider. Document Released: 08/06/2005 Document Revised: 02/24/2016 Document Reviewed: 01/18/2016 Elsevier Interactive Patient Education  2017 Elsevier Inc.  

## 2016-10-16 NOTE — Progress Notes (Signed)
Patient ID: Tyler Brewer, male    DOB: 02/04/1961  Age: 56 y.o. MRN: [id number]    Subjective:  Subjective  HPI [name] presents for f/u blood sugar and bp.  He has been watching his diet and his bp and sugars have been better at home  HYPERTENSION   Blood pressure range-not checking lately  Chest pain- no      Dyspnea- no Lightheadedness- no   Edema- no  Other side effects - no   Medication compliance: good Low salt diet- yes    DIABETES    Blood Sugar ranges-130s   Polyuria- no New Visual problems- no  Hypoglycemic symptoms- no  Other side effects-no Medication compliance - good Last eye exam- 04/2016 Foot exam- 09/2016   HYPERLIPIDEMIA  Medication compliance- good RUQ pain- no  Muscle aches- no Other side effects-no   Review of Systems  Constitutional: Negative for chills and fever.  HENT: Negative for congestion and hearing loss.   Eyes: Negative for discharge.  Respiratory: Negative for cough and shortness of breath.   Cardiovascular: Negative for chest pain, palpitations and leg swelling.  Gastrointestinal: Negative for abdominal pain, blood in stool, constipation, diarrhea, nausea and vomiting.  Genitourinary: Negative for dysuria, frequency, hematuria and urgency.  Musculoskeletal: Negative for back pain and myalgias.  Skin: Negative for rash.  Allergic/Immunologic: Negative for environmental allergies.  Neurological: Negative for dizziness, weakness and headaches.  Hematological: Does not bruise/bleed easily.  Psychiatric/Behavioral: Negative for suicidal ideas. The patient is not nervous/anxious.     History Past Medical History:  Diagnosis Date  . CAD (coronary artery disease)  cardiac cath with moderate disease in a septal branch of the ramus intermedius 04/01/2012  . Depression   . Diabetes mellitus    poorly controlled by his report  . History of narcotic addiction ([place])    past history of back pain  . Hypercholesteremia   .  Hypertension   . IBS (irritable bowel syndrome)   . Obesity    Max weight was 390  . OSA on CPAP   . Panic attacks   . Testosterone deficiency   . Vertigo     He has a past surgical history that includes Cardiac catheterization and left heart catheterization with coronary angiogram (N/A, 03/31/2012).   His family history includes Diabetes type II in his father; Healthy in his daughter, mother, sister, and son; Hypertension in his father; Pancreatic disease (age of onset: 63) in his father.He reports that he quit smoking about 10 months ago. He has never used smokeless tobacco. He reports that he does not drink alcohol or use drugs.  Current Outpatient Prescriptions on File Prior to Visit  Medication Sig Dispense Refill  . B-D ULTRAFINE III SHORT PEN 31G X 8 MM MISC USE WITH VICTOZA AS INSTRUCTED 100 each 0  . escitalopram (LEXAPRO) 20 MG tablet Take 1 tablet (20 mg total) by mouth daily. 30 tablet 2  . fluticasone (FLONASE) 50 MCG/ACT nasal spray Place 2 sprays into both nostrils daily. 16 g 0  . folic acid (FOLVITE) 1 MG tablet Take 1 mg by mouth daily.    . furosemide (LASIX) 40 MG tablet Take 1 tablet (40 mg total) by mouth 2 (two) times daily. 60 tablet 2  . liraglutide (VICTOZA) 18 MG/3ML SOPN 1.8 mg sq qd 6 mL 0  . spironolactone (ALDACTONE) 25 MG tablet Take 1 tablet (25 mg total) by mouth daily. 30 tablet 1  . vitamin B-12 (CYANOCOBALAMIN) 1000 MCG tablet Take 1,000  mcg by mouth daily.     No current facility-administered medications on file prior to visit.      Objective:  Objective  Physical Exam  Constitutional: He is oriented to person, place, and time. Vital signs are normal. He appears well-developed and well-nourished. He is sleeping.  HENT:  Head: Normocephalic and atraumatic.  Mouth/Throat: Oropharynx is clear and moist.  Eyes: EOM are normal. Pupils are equal, round, and reactive to light.  Neck: Normal range of motion. Neck supple. No thyromegaly present.    Cardiovascular: Normal rate and regular rhythm.   No murmur heard. Pulmonary/Chest: Effort normal and breath sounds normal. No respiratory distress. He has no wheezes. He has no rales. He exhibits no tenderness.  Musculoskeletal: He exhibits no edema or tenderness.  Neurological: He is alert and oriented to person, place, and time.  Skin: Skin is warm and dry.  Psychiatric: He has a normal mood and affect. His behavior is normal. Judgment and thought content normal.  Nursing note and vitals reviewed.  BP 128/80 (BP Location: Left Arm, Patient Position: Sitting, Cuff Size: Large)   Pulse 80   Temp 97.7 F (36.5 C) (Oral)   Resp 18   Ht 5\' 11"  (1.803 m)   Wt (!) 350 lb 6.4 oz (158.9 kg)   SpO2 96%   BMI 48.87 kg/m  Wt Readings from Last 3 Encounters:  10/16/16 (!) 350 lb 6.4 oz (158.9 kg)  10/04/16 (!) 350 lb (158.8 kg)  09/28/16 (!) 357 lb 12.8 oz (162.3 kg)     Lab Results  Component Value Date   WBC 5.9 10/04/2016   HGB 15.0 10/04/2016   HCT 43.9 10/04/2016   PLT 238 10/04/2016   GLUCOSE 240 (H) 10/04/2016   CHOL 228 (H) 09/25/2016   TRIG 233.0 (H) 09/25/2016   HDL 51.80 09/25/2016   LDLDIRECT 146.0 09/25/2016   LDLCALC 117 (H) 04/25/2016   ALT 31 09/25/2016   AST 18 09/25/2016   NA 135 10/04/2016   K 4.0 10/04/2016   CL 104 10/04/2016   CREATININE 0.83 10/04/2016   BUN 17 10/04/2016   CO2 22 10/04/2016   TSH 2.89 04/25/2016   INR 1.1 07/30/2013   HGBA1C 8.8 (H) 09/25/2016   MICROALBUR 1.5 09/25/2016    No results found.   Assessment & Plan:  Plan  I have discontinued Mr. Rancourt amoxicillin-clavulanate. I am also having him maintain his B-D ULTRAFINE III SHORT PEN, liraglutide, furosemide, fluticasone, escitalopram, folic acid, vitamin 0000000, spironolactone, liraglutide, traMADol, amLODipine, glipiZIDE, metFORMIN, nortriptyline, metoprolol succinate, and valsartan.  Meds ordered this encounter  Medications  . liraglutide (VICTOZA) 18 MG/3ML SOPN     Sig: Inject 1.8 mg under the skin daily    Dispense:  6 mL    Refill:  2  . traMADol (ULTRAM) 50 MG tablet    Sig: Take 1 tablet (50 mg total) by mouth every 8 (eight) hours as needed.    Dispense:  60 tablet    Refill:  0  . amLODipine (NORVASC) 10 MG tablet    Sig: Take 1 tablet (10 mg total) by mouth daily.    Dispense:  30 tablet    Refill:  0  . glipiZIDE (GLUCOTROL) 10 MG tablet    Sig: Take 1 tablet (10 mg total) by mouth 2 (two) times daily before a meal.    Dispense:  60 tablet    Refill:  2  . metFORMIN (GLUCOPHAGE) 1000 MG tablet    Sig: Take 1  tablet (1,000 mg total) by mouth 2 (two) times daily with a meal.    Dispense:  60 tablet    Refill:  2  . nortriptyline (PAMELOR) 10 MG capsule    Sig: Take 2 capsules (20 mg total) by mouth at bedtime.    Dispense:  60 capsule    Refill:  3  . metoprolol succinate (TOPROL-XL) 100 MG 24 hr tablet    Sig: Take 1 tablet (100 mg total) by mouth daily. Take with or immediately following a meal.    Dispense:  30 tablet    Refill:  2  . valsartan (DIOVAN) 160 MG tablet    Sig: Take 1 tablet (160 mg total) by mouth daily.    Dispense:  30 tablet    Refill:  2    Problem List Items Addressed This Visit      Unprioritized   Diabetes mellitus, type II (HCC) (Chronic)    CON'T WITH MEDS , DIET AND EXERCISE REFILL VICTOZA RECHECK LABS IN 3 MONTHS      Relevant Medications   liraglutide (VICTOZA) 18 MG/3ML SOPN   glipiZIDE (GLUCOTROL) 10 MG tablet   metFORMIN (GLUCOPHAGE) 1000 MG tablet   valsartan (DIOVAN) 160 MG tablet   HTN (hypertension) (Chronic)    STABLE RECHECK 3 MONTHS  CON'T CURRENT MEDS      Relevant Medications   amLODipine (NORVASC) 10 MG tablet   metoprolol succinate (TOPROL-XL) 100 MG 24 hr tablet   valsartan (DIOVAN) 160 MG tablet    Other Visit Diagnoses    Psoriatic arthritis ([place])    -  Primary   Relevant Medications   traMADol (ULTRAM) 50 MG tablet   nortriptyline (PAMELOR) 10 MG capsule   DM  (diabetes mellitus) type II uncontrolled, periph vascular disorder (HCC)       Relevant Medications   liraglutide (VICTOZA) 18 MG/3ML SOPN   amLODipine (NORVASC) 10 MG tablet   glipiZIDE (GLUCOTROL) 10 MG tablet   metFORMIN (GLUCOPHAGE) 1000 MG tablet   metoprolol succinate (TOPROL-XL) 100 MG 24 hr tablet   valsartan (DIOVAN) 160 MG tablet   Hyperlipidemia LDL goal <70       Relevant Medications   amLODipine (NORVASC) 10 MG tablet   metoprolol succinate (TOPROL-XL) 100 MG 24 hr tablet   valsartan (DIOVAN) 160 MG tablet      Follow-up: Return in about 3 months (around 01/13/2017) for hypertension, hyperlipidemia, diabetes II.  [name], DO

## 2016-10-16 NOTE — Assessment & Plan Note (Signed)
CON'T WITH MEDS , DIET AND EXERCISE REFILL VICTOZA RECHECK LABS IN 3 MONTHS

## 2016-10-25 ENCOUNTER — Encounter: Payer: Self-pay | Admitting: Family Medicine

## 2016-10-25 NOTE — Telephone Encounter (Signed)
Refill x1---  5 refills 

## 2016-10-26 ENCOUNTER — Other Ambulatory Visit: Payer: Self-pay | Admitting: Family Medicine

## 2016-10-26 DIAGNOSIS — L4050 Arthropathic psoriasis, unspecified: Secondary | ICD-10-CM

## 2016-10-26 MED ORDER — NORTRIPTYLINE HCL 10 MG PO CAPS: 20.0000 mg | ORAL_CAPSULE | Freq: Every day | ORAL | 5 refills | Status: DC

## 2016-11-06 ENCOUNTER — Other Ambulatory Visit: Payer: Self-pay | Admitting: *Deleted

## 2016-11-06 DIAGNOSIS — IMO0002 Reserved for concepts with insufficient information to code with codable children: Secondary | ICD-10-CM

## 2016-11-06 DIAGNOSIS — E1151 Type 2 diabetes mellitus with diabetic peripheral angiopathy without gangrene: Secondary | ICD-10-CM

## 2016-11-06 DIAGNOSIS — E1165 Type 2 diabetes mellitus with hyperglycemia: Principal | ICD-10-CM

## 2016-11-06 MED ORDER — GLIPIZIDE 10 MG PO TABS: 10.0000 mg | ORAL_TABLET | Freq: Two times a day (BID) | ORAL | 2 refills | Status: DC

## 2016-11-06 NOTE — Progress Notes (Signed)
Rx sent to pharmacy on 02/027/18 was sent as "Fill Later", instead of Normal; Refill sent per [facility] refill protocol/SLS

## 2016-11-12 ENCOUNTER — Other Ambulatory Visit: Payer: Self-pay | Admitting: Family Medicine

## 2016-11-12 DIAGNOSIS — I10 Essential (primary) hypertension: Secondary | ICD-10-CM

## 2016-11-12 MED ORDER — AMLODIPINE BESYLATE 10 MG PO TABS: 10.0000 mg | ORAL_TABLET | Freq: Every day | ORAL | 6 refills | Status: DC

## 2016-12-04 ENCOUNTER — Other Ambulatory Visit: Payer: Self-pay | Admitting: Family Medicine

## 2016-12-04 DIAGNOSIS — E1151 Type 2 diabetes mellitus with diabetic peripheral angiopathy without gangrene: Secondary | ICD-10-CM

## 2016-12-04 DIAGNOSIS — I10 Essential (primary) hypertension: Secondary | ICD-10-CM

## 2016-12-04 DIAGNOSIS — IMO0002 Reserved for concepts with insufficient information to code with codable children: Secondary | ICD-10-CM

## 2016-12-04 DIAGNOSIS — E1165 Type 2 diabetes mellitus with hyperglycemia: Secondary | ICD-10-CM

## 2016-12-04 MED ORDER — AMLODIPINE BESYLATE 10 MG PO TABS: 10.0000 mg | ORAL_TABLET | Freq: Every day | ORAL | 6 refills | Status: DC

## 2016-12-04 MED ORDER — GLIPIZIDE 10 MG PO TABS: 10.0000 mg | ORAL_TABLET | Freq: Two times a day (BID) | ORAL | 2 refills | Status: DC

## 2016-12-06 ENCOUNTER — Encounter: Payer: Self-pay | Admitting: Family Medicine

## 2016-12-06 DIAGNOSIS — I10 Essential (primary) hypertension: Secondary | ICD-10-CM

## 2016-12-06 MED ORDER — SPIRONOLACTONE 25 MG PO TABS: 25.0000 mg | ORAL_TABLET | Freq: Every day | ORAL | 1 refills | Status: DC

## 2016-12-06 MED ORDER — AMLODIPINE BESYLATE 10 MG PO TABS: 10.0000 mg | ORAL_TABLET | Freq: Every day | ORAL | 6 refills | Status: DC

## 2016-12-10 ENCOUNTER — Telehealth: Payer: Self-pay | Admitting: Family Medicine

## 2016-12-10 ENCOUNTER — Encounter (HOSPITAL_BASED_OUTPATIENT_CLINIC_OR_DEPARTMENT_OTHER): Payer: Self-pay | Admitting: Emergency Medicine

## 2016-12-10 ENCOUNTER — Emergency Department (HOSPITAL_BASED_OUTPATIENT_CLINIC_OR_DEPARTMENT_OTHER)
Admission: EM | Admit: 2016-12-10 | Discharge: 2016-12-10 | Disposition: A | Discharge status: Home / Self Care | Payer: PRIVATE HEALTH INSURANCE | Attending: Emergency Medicine | Admitting: Emergency Medicine

## 2016-12-10 DIAGNOSIS — F419 Anxiety disorder, unspecified: Secondary | ICD-10-CM | POA: Insufficient documentation

## 2016-12-10 DIAGNOSIS — E1165 Type 2 diabetes mellitus with hyperglycemia: Secondary | ICD-10-CM | POA: Insufficient documentation

## 2016-12-10 DIAGNOSIS — I2510 Atherosclerotic heart disease of native coronary artery without angina pectoris: Secondary | ICD-10-CM | POA: Insufficient documentation

## 2016-12-10 DIAGNOSIS — Z79899 Other long term (current) drug therapy: Secondary | ICD-10-CM | POA: Diagnosis not present

## 2016-12-10 DIAGNOSIS — F17200 Nicotine dependence, unspecified, uncomplicated: Secondary | ICD-10-CM | POA: Diagnosis not present

## 2016-12-10 DIAGNOSIS — Z9114 Patient's other noncompliance with medication regimen: Secondary | ICD-10-CM | POA: Insufficient documentation

## 2016-12-10 DIAGNOSIS — I10 Essential (primary) hypertension: Secondary | ICD-10-CM | POA: Insufficient documentation

## 2016-12-10 DIAGNOSIS — Z7984 Long term (current) use of oral hypoglycemic drugs: Secondary | ICD-10-CM | POA: Diagnosis not present

## 2016-12-10 DIAGNOSIS — R200 Anesthesia of skin: Secondary | ICD-10-CM | POA: Diagnosis present

## 2016-12-10 DIAGNOSIS — R739 Hyperglycemia, unspecified: Secondary | ICD-10-CM

## 2016-12-10 LAB — BASIC METABOLIC PANEL
Anion gap: 10 (ref 5–15)
BUN: 15 mg/dL (ref 6–20)
CO2: 22 mmol/L (ref 22–32)
Calcium: 9.3 mg/dL (ref 8.9–10.3)
Chloride: 103 mmol/L (ref 101–111)
Creatinine, Ser: 1.02 mg/dL (ref 0.61–1.24)
GFR calc Af Amer: >60 mL/min (ref >60)
GFR calc non Af Amer: >60 mL/min (ref >60)
Glucose, Bld: 418 mg/dL — ABNORMAL HIGH (ref 65–99)
Potassium: 3.8 mmol/L (ref 3.5–5.1)
Sodium: 135 mmol/L (ref 135–145)

## 2016-12-10 LAB — CBC
HCT: 43.2 % (ref 39.0–52.0)
Hemoglobin: 14.8 g/dL (ref 13.0–17.0)
MCH: 31 pg (ref 26.0–34.0)
MCHC: 34.3 g/dL (ref 30.0–36.0)
MCV: 90.6 fL (ref 78.0–100.0)
Platelets: 239 10*3/uL (ref 150–400)
RBC: 4.77 MIL/uL (ref 4.22–5.81)
RDW: 15.5 % (ref 11.5–15.5)
WBC: 5.1 10*3/uL (ref 4.0–10.5)

## 2016-12-10 LAB — CBG MONITORING, ED
Glucose-Capillary: 411 mg/dL — ABNORMAL HIGH (ref 65–99)
Glucose-Capillary: 354 mg/dL — ABNORMAL HIGH (ref 65–99)

## 2016-12-10 MED ORDER — SODIUM CHLORIDE 0.9 % IV BOLUS (SEPSIS)
2000.0000 mL | Freq: Once | INTRAVENOUS | Status: AC
  Administered 2016-12-10: 2000 mL via INTRAVENOUS

## 2016-12-10 MED ORDER — ACETAMINOPHEN 325 MG PO TABS: 650.0000 mg | ORAL_TABLET | Freq: Once | ORAL | Status: DC

## 2016-12-10 NOTE — Telephone Encounter (Signed)
Per chart review, pt was discharged from ER at 11:49 am.  Called patient to schedule ER follow up and ask about med refills.  Pt did not answer. Left a message for call back.

## 2016-12-10 NOTE — ED Provider Notes (Signed)
[facility] DEPT [facility] Provider Note   CSN: [id number] Arrival date & time: 12/10/16  0841     History   Chief Complaint Chief Complaint  Patient presents with  . Aphasia  . Numbness    HPI Tyler Brewer is a 56 y.o. male.  HPI Complains of slurred speech and left-sided facial numbness intermittently for the past 1.5 weeks. Patient was seen at hospital in [place] on 04/09/20018 for same symptoms, code stroke called. He had workup including MRI CT scan, echocardiogram all of which showed no acute abnormality. He reports that he's run out of his blood pressure medication 4 days ago. He states his speech became slurred and numbness on the left side of his face again developed 2 days ago has been constant for the past 2 days. Nothing makes symptoms better or worse. Past Medical History:  Diagnosis Date  . CAD (coronary artery disease)  cardiac cath with moderate disease in a septal branch of the ramus intermedius 04/01/2012  . Depression   . Diabetes mellitus    poorly controlled by his report  . History of narcotic addiction ([place])    past history of back pain  . Hypercholesteremia   . Hypertension   . IBS (irritable bowel syndrome)   . Obesity    Max weight was 390  . OSA on CPAP   . Panic attacks   . Testosterone deficiency   . Vertigo     Patient Active Problem List   Diagnosis Date Noted  . Pansinusitis 09/26/2016  . Diabetic polyneuropathy associated with diabetes mellitus due to underlying condition ([place]) 05/08/2016  . Methamphetamine use disorder, severe, dependence ([place]) 02/11/2016  . Substance induced mood disorder ([place]) 02/11/2016  . Exertional dyspnea 11/30/2015  . ADD (attention deficit disorder) 09/29/2015  . Binge eating 09/29/2015  . Syncope 05/05/2012  . Diarrhea 05/05/2012  . Panic attacks 05/05/2012  . OSA (obstructive sleep apnea) 05/05/2012  . HTN (hypertension) 05/05/2012  . Diabetes mellitus, type II ([place]) 05/05/2012  .  Dyslipidemia 05/05/2012  . CAD (coronary artery disease)  cardiac cath with moderate disease in a septal branch of the ramus intermedius 04/01/2012  . Chest pain 04/01/2012  . Drug abuse and dependence ([place]) 04/01/2012  . Family history of coronary artery disease 03/31/2012  . Sleep apnea, on C-pap 03/31/2012  . Hyperlipemia 03/30/2012  . HTN (hypertension), benign 03/30/2012  . Morbid obesity ([place]) 03/30/2012  . DM type 2, uncontrolled, with neuropathy ([place]) 03/30/2012  . Depression with anxiety 03/30/2012    Past Surgical History:  Procedure Laterality Date  . CARDIAC CATHETERIZATION    . LEFT HEART CATHETERIZATION WITH CORONARY ANGIOGRAM N/A 03/31/2012   Procedure: LEFT HEART CATHETERIZATION WITH CORONARY ANGIOGRAM;  Surgeon: Leonie Man, MD;  Location[facility] C[facility] LAB;  Service: Cardiovascular;  Laterality: N/A;       Home Medications    Prior to Admission medications   Medication Sig Start Date End Date Taking? Authorizing Provider  amLODipine (NORVASC) 10 MG tablet Take 1 tablet (10 mg total) by mouth daily. 12/06/16  Yes Yvonne R Lowne Chase, DO  B-D ULTRAFINE III SHORT PEN 31G X 8 MM MISC USE WITH VICTOZA AS INSTRUCTED 06/25/16  Yes Yvonne R Lowne Chase, DO  fluticasone (FLONASE) 50 MCG/ACT nasal spray Place 2 sprays into both nostrils daily. 09/25/16  Yes Yvonne R Lowne Chase, DO  folic acid (FOLVITE) 1 MG tablet Take 1 mg by mouth daily.   Yes Historical Provider, MD  furosemide (LASIX) 40  MG tablet Take 1 tablet (40 mg total) by mouth 2 (two) times daily. 09/25/16  Yes Yvonne R Lowne Chase, DO  glipiZIDE (GLUCOTROL) 10 MG tablet Take 1 tablet (10 mg total) by mouth 2 (two) times daily before a meal. 12/04/16  Yes Alferd Apa Lowne Chase, DO  liraglutide (VICTOZA) 18 MG/3ML SOPN 1.8 mg sq qd 09/25/16  Yes Yvonne R Lowne Chase, DO  liraglutide (VICTOZA) 18 MG/3ML SOPN Inject 1.8 mg under the skin daily 10/16/16  Yes Yvonne R Lowne Chase, DO  metFORMIN (GLUCOPHAGE) 1000 MG tablet Take 1  tablet (1,000 mg total) by mouth 2 (two) times daily with a meal. 10/16/16  Yes Alferd Apa Lowne Chase, DO  metoprolol succinate (TOPROL-XL) 100 MG 24 hr tablet Take 1 tablet (100 mg total) by mouth daily. Take with or immediately following a meal. 10/16/16  Yes Yvonne R Lowne Chase, DO  nortriptyline (PAMELOR) 10 MG capsule Take 2 capsules (20 mg total) by mouth at bedtime. 10/26/16  Yes Yvonne R Lowne Chase, DO  spironolactone (ALDACTONE) 25 MG tablet Take 1 tablet (25 mg total) by mouth daily. 12/06/16  Yes Yvonne R Lowne Chase, DO  traMADol (ULTRAM) 50 MG tablet Take 1 tablet (50 mg total) by mouth every 8 (eight) hours as needed. 10/16/16  Yes Yvonne R Lowne Chase, DO  valsartan (DIOVAN) 160 MG tablet Take 1 tablet (160 mg total) by mouth daily. 10/16/16  Yes Yvonne R Lowne Chase, DO  vitamin B-12 (CYANOCOBALAMIN) 1000 MCG tablet Take 1,000 mcg by mouth daily.   Yes Historical Provider, MD  escitalopram (LEXAPRO) 20 MG tablet Take 1 tablet (20 mg total) by mouth daily. 09/26/16   [name], DO    Family History Family History  Problem Relation Age of Onset  . Diabetes type II Father   . Hypertension Father   . Pancreatic disease Father 43    Deceased  . Healthy Mother   . Healthy Sister   . Healthy Son   . Healthy Daughter     Social History Social History  Substance Use Topics  . Smoking status: Light Tobacco Smoker    Last attempt to quit: 11/21/2015  . Smokeless tobacco: Never Used  . Alcohol use No     Comment: Rare   Last used methamphetamine June 2018.  Allergies   Patient has no known allergies.   Review of Systems Review of Systems  Constitutional: Negative.   HENT: Negative.   Respiratory: Negative.   Cardiovascular: Negative.   Gastrointestinal: Negative.   Musculoskeletal: Negative.   Skin: Negative.   Allergic/Immunologic: Positive for immunocompromised state.       Diabetic  Neurological: Positive for speech difficulty and numbness.    Psychiatric/Behavioral: Negative.   All other systems reviewed and are negative.    Physical Exam Updated Vital Signs Pulse 74   Temp 97.8 F (36.6 C) (Axillary)   Resp 18   Ht 5\' 11"  (1.803 m)   Wt (!) 340 lb (154.2 kg)   SpO2 99%   BMI 47.42 kg/m   Physical Exam  Constitutional: He appears well-developed and well-nourished.  HENT:  Head: Normocephalic and atraumatic.  No facial asymmetry  Eyes: Conjunctivae are normal. Pupils are equal, round, and reactive to light.  Neck: Neck supple. No tracheal deviation present. No thyromegaly present.  No bruit  Cardiovascular: Normal rate and regular rhythm.   No murmur heard. Pulmonary/Chest: Effort normal and breath sounds normal.  Abdominal: Soft. Bowel sounds are normal. He  exhibits no distension. There is no tenderness.  Obese  Musculoskeletal: Normal range of motion. He exhibits no edema or tenderness.  Neurological: He is alert. Coordination normal.  Speech clear. Cranial nerves II through XII grossly intact. Gait normal Romberg normal pronator drift normal DTR symmetric bilaterally at knee jerk ankle jerk and biceps toes downward going bilaterally. Motor strength 5 over 5 overall  Skin: Skin is warm and dry. No rash noted.  Psychiatric: He has a normal mood and affect.  Nursing note and vitals reviewed.    ED Treatments / Results  Labs (all labs ordered are listed, but only abnormal results are displayed) Labs Reviewed  CBG MONITORING, ED - Abnormal; Notable for the following:       Result Value   Glucose-Capillary 411 (*)    All other components within normal limits    EKG  EKG Interpretation  Date/Time:  Monday December 10 2016 08:48:57 EDT Ventricular Rate:  74 PR Interval:    QRS Duration: 89 QT Interval:  389 QTC Calculation: 432 R Axis:   -9 Text Interpretation:  Sinus rhythm Baseline wander in lead(s) V1 V2 No significant change since last tracing Confirmed by Winfred Leeds  MD, Quinnley Colasurdo ([id number]) on 12/10/2016  8:59:36 AM       Radiology No results found.  Procedures Procedures (including critical care time)  Medications Ordered in ED Medications - No data to display  Results for orders placed or performed during the hospital encounter of 35/46/56  Basic metabolic panel  Result Value Ref Range   Sodium 135 135 - 145 mmol/L   Potassium 3.8 3.5 - 5.1 mmol/L   Chloride 103 101 - 111 mmol/L   CO2 22 22 - 32 mmol/L   Glucose, Bld 418 (H) 65 - 99 mg/dL   BUN 15 6 - 20 mg/dL   Creatinine, Ser 1.02 0.61 - 1.24 mg/dL   Calcium 9.3 8.9 - 10.3 mg/dL   GFR calc non Af Amer >60 >60 mL/min   GFR calc Af Amer >60 >60 mL/min   Anion gap 10 5 - 15  CBC  Result Value Ref Range   WBC 5.1 4.0 - 10.5 K/uL   RBC 4.77 4.22 - 5.81 MIL/uL   Hemoglobin 14.8 13.0 - 17.0 g/dL   HCT 43.2 39.0 - 52.0 %   MCV 90.6 78.0 - 100.0 fL   MCH 31.0 26.0 - 34.0 pg   MCHC 34.3 30.0 - 36.0 g/dL   RDW 15.5 11.5 - 15.5 %   Platelets 239 150 - 400 K/uL  CBG monitoring, ED  Result Value Ref Range   Glucose-Capillary 411 (H) 65 - 99 mg/dL  CBG monitoring, ED  Result Value Ref Range   Glucose-Capillary 354 (H) 65 - 99 mg/dL   No results found. Initial Impression / Assessment and Plan / ED Course  I have reviewed the triage vital signs and the nursing notes.  Pertinent labs & imaging results that were available during my care of the patient were reviewed by me and considered in my medical decision making (see chart for details).    11:35 PM patient resting comfortably. Asymptomatic. Patient's blood pressure mildly elevated he states that he has prescriptions for antihypertensive medications waiting for him at the pharmacy and he also did not take his metformin this morning which is likely cause of his elevated blood sugar. He does take aspirin 81 mg daily.  Strongly doubt patient having TIAs or stroke. He has had a full negative stroke workup less  than 2 weeks ago. Normal neurologic exam He reports considerable  emotional stress stating that he lives with a heroin addict . I've discussed case with Dr.Lowne who will see patient in follow-up Final Clinical Impressions(s) / ED Diagnoses  Diagnoses #1 hyperglycemia #2 elevated blood pressure #3 medication noncompliance #4anxiety Final diagnoses:  None    New Prescriptions New Prescriptions   No medications on file     Orlie Dakin, MD 12/10/16 1141

## 2016-12-10 NOTE — ED Notes (Signed)
Pt asleep with this RN entered the room Blood specimens will be collected.

## 2016-12-10 NOTE — ED Notes (Signed)
ED Provider at bedside. 

## 2016-12-10 NOTE — Telephone Encounter (Signed)
Dr. Charlett Lango from downstairs in the ED is requesting a call back from PCP. He says that pt is in the ED.    CB: [id number]

## 2016-12-10 NOTE — ED Triage Notes (Signed)
Pt c/o slurred speech and left side facial numbness droop. Pt a/o denies pain. Hx of DM, Neuropathy. Last week admitted to a hospital in [place] for a full work up of the same symptoms. EPD at bedside.

## 2016-12-10 NOTE — Discharge Instructions (Signed)
Take all of your medications as prescribed. Your blood sugar today was elevated at 418. Blood pressure was mildly elevated at 151/99 .Call Dr. Nonda Lou office today to schedule an appointment for within a week. Return if your condition worsens or if concern for any reason

## 2016-12-10 NOTE — Telephone Encounter (Signed)
agree

## 2016-12-10 NOTE — Telephone Encounter (Signed)
Dr. Carollee Herter returned Dr. Charlett Lango call and spoke to him about patient.  Verbal order given to nurse to call patient back once he's discharged to schedule a ER follow up and to inquire about medication refills.

## 2016-12-10 NOTE — ED Notes (Signed)
Pt reports that he's been under a lot of stress due to his having to watch his girlfriend who is struggling with heroine addiction. States he does not sleep at night attempting to take care of her. Pt wants to know if his symptoms are related to stress.

## 2016-12-11 ENCOUNTER — Other Ambulatory Visit: Payer: Self-pay | Admitting: Family Medicine

## 2016-12-11 DIAGNOSIS — I10 Essential (primary) hypertension: Secondary | ICD-10-CM

## 2016-12-11 MED ORDER — AMLODIPINE BESYLATE 10 MG PO TABS: 10.0000 mg | ORAL_TABLET | Freq: Every day | ORAL | 6 refills | Status: DC

## 2016-12-11 NOTE — Telephone Encounter (Addendum)
Called to follow up with patient.  He stated that he feels much better today.  States symptoms have resolved.  He feels symptoms were due to being under a lot of stress.    As for his medications---He recently moved to [place] and has moved his medications to the [employer].  He said he is not completely out of his medications.   Instead he has run out of money due to the move, and will not be able to pick up his medications until Thursday (12/13/16) of this week.    He has agreed to come in for ER follow up.  ER follow up scheduled for Monday, April 30th at 9:15 am with Dr. Carollee Herter.

## 2016-12-12 ENCOUNTER — Encounter: Payer: Self-pay | Admitting: Family Medicine

## 2016-12-17 ENCOUNTER — Ambulatory Visit: Payer: No Typology Code available for payment source | Admitting: Family Medicine

## 2016-12-22 ENCOUNTER — Emergency Department (HOSPITAL_BASED_OUTPATIENT_CLINIC_OR_DEPARTMENT_OTHER)
Admission: EM | Admit: 2016-12-22 | Discharge: 2016-12-22 | Disposition: A | Discharge status: Home / Self Care | Payer: No Typology Code available for payment source | Attending: Emergency Medicine | Admitting: Emergency Medicine

## 2016-12-22 ENCOUNTER — Emergency Department (HOSPITAL_BASED_OUTPATIENT_CLINIC_OR_DEPARTMENT_OTHER): Payer: No Typology Code available for payment source

## 2016-12-22 ENCOUNTER — Encounter (HOSPITAL_BASED_OUTPATIENT_CLINIC_OR_DEPARTMENT_OTHER): Payer: Self-pay | Admitting: *Deleted

## 2016-12-22 DIAGNOSIS — I10 Essential (primary) hypertension: Secondary | ICD-10-CM | POA: Insufficient documentation

## 2016-12-22 DIAGNOSIS — E1165 Type 2 diabetes mellitus with hyperglycemia: Secondary | ICD-10-CM | POA: Diagnosis not present

## 2016-12-22 DIAGNOSIS — Z79899 Other long term (current) drug therapy: Secondary | ICD-10-CM | POA: Diagnosis not present

## 2016-12-22 DIAGNOSIS — I2510 Atherosclerotic heart disease of native coronary artery without angina pectoris: Secondary | ICD-10-CM | POA: Diagnosis not present

## 2016-12-22 DIAGNOSIS — Z7984 Long term (current) use of oral hypoglycemic drugs: Secondary | ICD-10-CM | POA: Insufficient documentation

## 2016-12-22 DIAGNOSIS — R739 Hyperglycemia, unspecified: Secondary | ICD-10-CM

## 2016-12-22 DIAGNOSIS — J209 Acute bronchitis, unspecified: Secondary | ICD-10-CM | POA: Diagnosis not present

## 2016-12-22 DIAGNOSIS — F17200 Nicotine dependence, unspecified, uncomplicated: Secondary | ICD-10-CM | POA: Diagnosis not present

## 2016-12-22 DIAGNOSIS — R05 Cough: Secondary | ICD-10-CM | POA: Diagnosis present

## 2016-12-22 LAB — URINALYSIS, ROUTINE W REFLEX MICROSCOPIC
Bilirubin Urine: NEGATIVE
Glucose, UA: >=500 mg/dL — AB
Hgb urine dipstick: NEGATIVE
Ketones, ur: NEGATIVE mg/dL
Leukocytes, UA: NEGATIVE
Nitrite: NEGATIVE
Protein, ur: NEGATIVE mg/dL
Specific Gravity, Urine: 1.031 — ABNORMAL HIGH (ref 1.005–1.030)
pH: 6 (ref 5.0–8.0)

## 2016-12-22 LAB — CBC WITH DIFFERENTIAL/PLATELET
Basophils Absolute: 0 10*3/uL (ref 0.0–0.1)
Basophils Relative: 0 %
Eosinophils Absolute: 0.2 10*3/uL (ref 0.0–0.7)
Eosinophils Relative: 2 %
HCT: 44.2 % (ref 39.0–52.0)
Hemoglobin: 15.4 g/dL (ref 13.0–17.0)
Lymphocytes Relative: 20 %
Lymphs Abs: 1.9 10*3/uL (ref 0.7–4.0)
MCH: 30.9 pg (ref 26.0–34.0)
MCHC: 34.8 g/dL (ref 30.0–36.0)
MCV: 88.8 fL (ref 78.0–100.0)
Monocytes Absolute: 0.9 10*3/uL (ref 0.1–1.0)
Monocytes Relative: 10 %
Neutro Abs: 6.6 10*3/uL (ref 1.7–7.7)
Neutrophils Relative %: 68 %
Platelets: 272 10*3/uL (ref 150–400)
RBC: 4.98 MIL/uL (ref 4.22–5.81)
RDW: 13.7 % (ref 11.5–15.5)
WBC: 9.6 10*3/uL (ref 4.0–10.5)

## 2016-12-22 LAB — BASIC METABOLIC PANEL
Anion gap: 10 (ref 5–15)
BUN: 23 mg/dL — ABNORMAL HIGH (ref 6–20)
CO2: 23 mmol/L (ref 22–32)
Calcium: 10.7 mg/dL — ABNORMAL HIGH (ref 8.9–10.3)
Chloride: 101 mmol/L (ref 101–111)
Creatinine, Ser: 0.92 mg/dL (ref 0.61–1.24)
GFR calc Af Amer: >60 mL/min (ref >60)
GFR calc non Af Amer: >60 mL/min (ref >60)
Glucose, Bld: 389 mg/dL — ABNORMAL HIGH (ref 65–99)
Potassium: 4.3 mmol/L (ref 3.5–5.1)
Sodium: 134 mmol/L — ABNORMAL LOW (ref 135–145)

## 2016-12-22 LAB — CBG MONITORING, ED
Glucose-Capillary: 413 mg/dL — ABNORMAL HIGH (ref 65–99)
Glucose-Capillary: 381 mg/dL — ABNORMAL HIGH (ref 65–99)

## 2016-12-22 LAB — URINALYSIS, MICROSCOPIC (REFLEX)

## 2016-12-22 IMAGING — CR DG CHEST 2V
2 series · 2 of 2 positions shown · non-contrast
Comparison: [DATE]

CLINICAL DATA: Cough for 2 days.

EXAM:
CHEST  2 VIEW

[w chest pa]
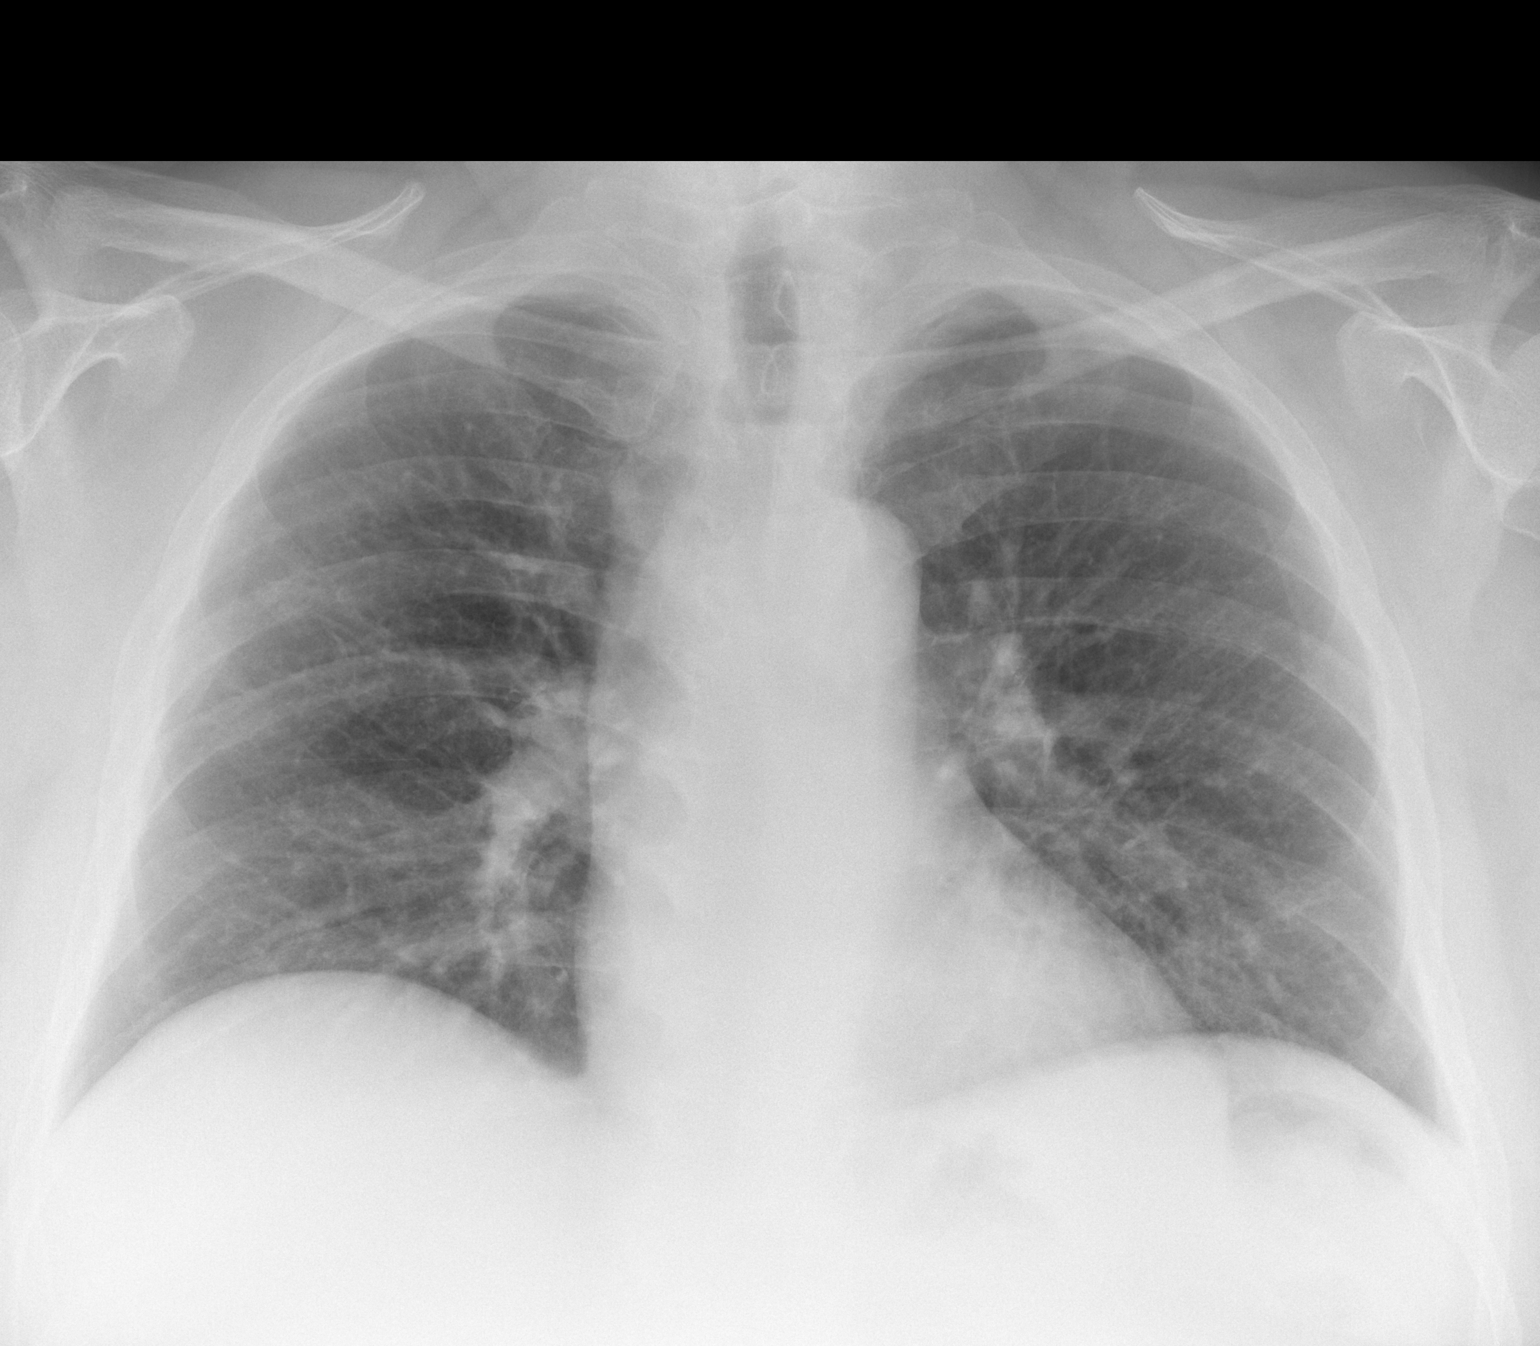

[w chest lat]
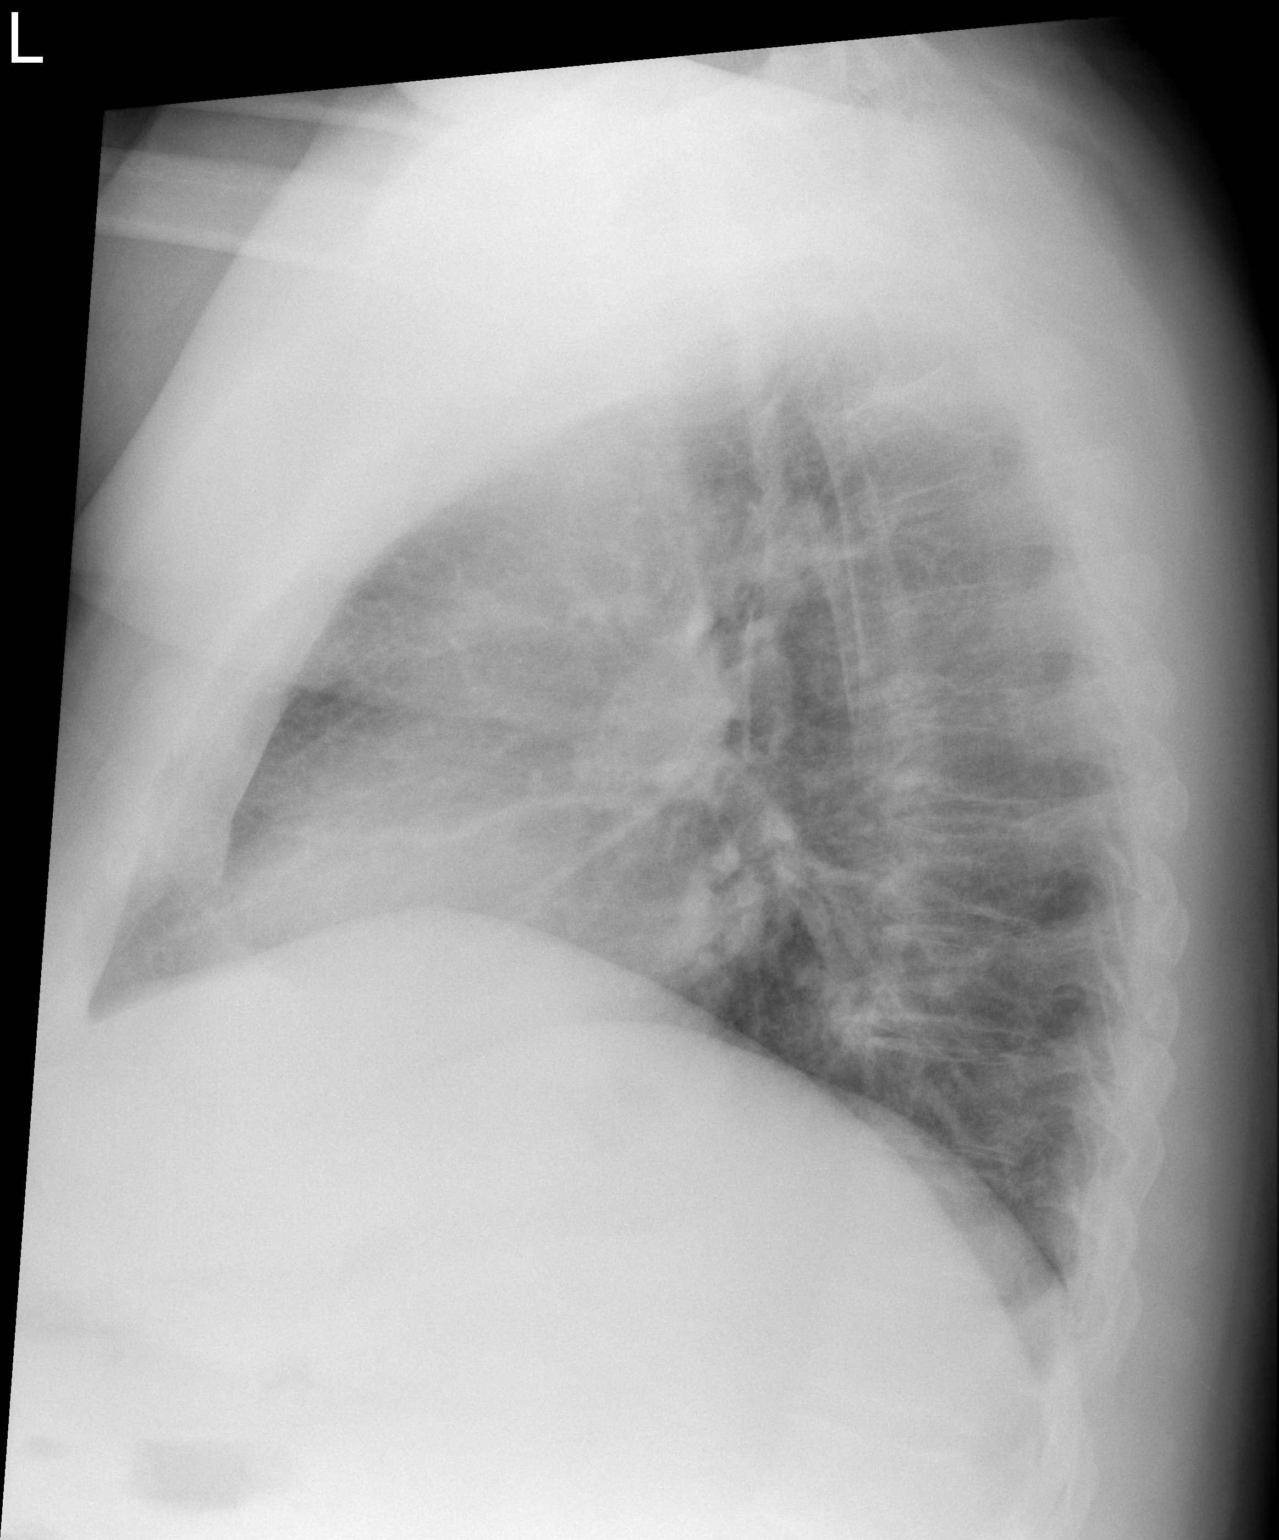

[2 of 2 positions shown; findings below may reference images not displayed]

FINDINGS: The lungs are clear. The pulmonary vasculature is normal. Heart size
is normal. Hilar and mediastinal contours are unremarkable. There is
no pleural effusion.
IMPRESSION: No active cardiopulmonary disease.

## 2016-12-22 MED ORDER — INSULIN ASPART 100 UNIT/ML ~~LOC~~ SOLN
10.0000 [IU] | Freq: Once | SUBCUTANEOUS | Status: AC
  Administered 2016-12-22: 10 [IU] via SUBCUTANEOUS
  Filled 2016-12-22: qty 1

## 2016-12-22 MED ORDER — AMOXICILLIN 500 MG PO CAPS: 1000.0000 mg | ORAL_CAPSULE | Freq: Two times a day (BID) | ORAL | 0 refills | Status: DC

## 2016-12-22 MED ORDER — ALBUTEROL SULFATE HFA 108 (90 BASE) MCG/ACT IN AERS
2.0000 | INHALATION_SPRAY | RESPIRATORY_TRACT | Status: DC | PRN
  Administered 2016-12-22: 2 via RESPIRATORY_TRACT
  Filled 2016-12-22: qty 6.7

## 2016-12-22 MED ORDER — SODIUM CHLORIDE 0.9 % IV BOLUS (SEPSIS)
500.0000 mL | Freq: Once | INTRAVENOUS | Status: AC
  Administered 2016-12-22: 500 mL via INTRAVENOUS

## 2016-12-22 MED ORDER — AMOXICILLIN 500 MG PO CAPS
1000.0000 mg | ORAL_CAPSULE | Freq: Once | ORAL | Status: AC
  Administered 2016-12-22: 1000 mg via ORAL
  Filled 2016-12-22: qty 2

## 2016-12-22 NOTE — ED Triage Notes (Signed)
Pt with cough x 2 days and urinary frequency x 10 days

## 2016-12-22 NOTE — ED Provider Notes (Signed)
[facility] DEPT [facility] Provider Note: Georgena Spurling, MD, FACEP  CSN: [id number] MRN: [id number] ARRIVAL: 12/22/16 at 0527 ROOM: [place]  Tyler Brewer is a 56 y.o. male with diabetes. He is here with a two-day history of cough. The cough has been severe enough to cause posttussive emesis at times. He has some mild shortness of breath associated with it. He denies chest pain. He is not sure if he has had a fever. He has had chills. The cough has been productive of green and yellow sputum. He has a history of pneumonia and was to make sure he is not getting this again.  He also complains of about a 10 day history of urinary frequency. He is urinating sometimes several times an hour. Sometimes he urinates large amounts per frequently he urinates small amounts. There is associated dribbling. He denies any dysuria or pain with urination. He also acknowledges that he drinks frequently due to thirst. He states he is compliant with his medication but he has a documented history of noncompliance. He denies chest pain or abdominal pain. He takes Lasix for chronic lower extremity edema. He has morbid obesity and is on CPAP for obstructive sleep apnea.   Past Medical History:  Diagnosis Date  . CAD (coronary artery disease)  cardiac cath with moderate disease in a septal branch of the ramus intermedius 04/01/2012  . Depression   . Diabetes mellitus    poorly controlled by his report  . History of narcotic addiction ([place])    past history of back pain  . Hypercholesteremia   . Hypertension   . IBS (irritable bowel syndrome)   . Obesity    Max weight was 390  . OSA on CPAP   . Panic attacks   . Testosterone deficiency   . Vertigo     Past Surgical History:  Procedure Laterality Date  . CARDIAC CATHETERIZATION    . LEFT HEART CATHETERIZATION WITH CORONARY ANGIOGRAM N/A 03/31/2012   Procedure: LEFT HEART CATHETERIZATION WITH CORONARY  ANGIOGRAM;  Surgeon: Leonie Man, MD;  Location: [facility] CATH LAB;  Service: Cardiovascular;  Laterality: N/A;    Family History  Problem Relation Age of Onset  . Diabetes type II Father   . Hypertension Father   . Pancreatic disease Father 22    Deceased  . Healthy Mother   . Healthy Sister   . Healthy Son   . Healthy Daughter     Social History  Substance Use Topics  . Smoking status: Light Tobacco Smoker    Last attempt to quit: 11/21/2015  . Smokeless tobacco: Never Used  . Alcohol use No     Comment: Rare    Prior to Admission medications   Medication Sig Start Date End Date Taking? Authorizing Provider  amLODipine (NORVASC) 10 MG tablet Take 1 tablet (10 mg total) by mouth daily. 12/11/16   [name] R, DO  B-D ULTRAFINE III SHORT PEN 31G X 8 MM MISC USE WITH VICTOZA AS INSTRUCTED 06/25/16   Carollee Herter, Alferd Apa, DO  escitalopram (LEXAPRO) 20 MG tablet Take 1 tablet (20 mg total) by mouth daily. 09/26/16   [name] R, DO  fluticasone (FLONASE) 50 MCG/ACT nasal spray Place 2 sprays into both nostrils daily. 09/25/16   [name], DO  folic acid (FOLVITE) 1 MG tablet Take 1 mg by mouth daily.    [provider]  furosemide (  LASIX) 40 MG tablet Take 1 tablet (40 mg total) by mouth 2 (two) times daily. 09/25/16   [name], DO  glipiZIDE (GLUCOTROL) 10 MG tablet Take 1 tablet (10 mg total) by mouth 2 (two) times daily before a meal. 12/04/16   Carollee Herter, Alferd Apa, DO  liraglutide (VICTOZA) 18 MG/3ML SOPN 1.8 mg sq qd 09/25/16   [name], DO  liraglutide (VICTOZA) 18 MG/3ML SOPN Inject 1.8 mg under the skin daily 10/16/16   Carollee Herter, Alferd Apa, DO  metFORMIN (GLUCOPHAGE) 1000 MG tablet Take 1 tablet (1,000 mg total) by mouth 2 (two) times daily with a meal. 10/16/16   Carollee Herter, Alferd Apa, DO  metoprolol succinate (TOPROL-XL) 100 MG 24 hr tablet Take 1 tablet (100 mg total) by mouth daily. Take with or immediately following  a meal. 10/16/16   Carollee Herter, Alferd Apa, DO  nortriptyline (PAMELOR) 10 MG capsule Take 2 capsules (20 mg total) by mouth at bedtime. 10/26/16   [name], DO  spironolactone (ALDACTONE) 25 MG tablet Take 1 tablet (25 mg total) by mouth daily. 12/06/16   [name], DO  traMADol (ULTRAM) 50 MG tablet Take 1 tablet (50 mg total) by mouth every 8 (eight) hours as needed. 10/16/16   [name], DO  valsartan (DIOVAN) 160 MG tablet Take 1 tablet (160 mg total) by mouth daily. 10/16/16   [name], DO  vitamin B-12 (CYANOCOBALAMIN) 1000 MCG tablet Take 1,000 mcg by mouth daily.    [provider]    Allergies Patient has no known allergies.   REVIEW OF SYSTEMS  Negative except as noted here or in the History of Present Illness.   PHYSICAL EXAMINATION  Initial Vital Signs Blood pressure (!) 167/110, pulse 95, temperature 97.9 F (36.6 C), temperature source Oral, SpO2 97 %.  Examination General: Well-developed, morbidly obese male in no acute distress; appearance consistent with age of record HENT: normocephalic; atraumatic Eyes: pupils equal, round and reactive to light; extraocular muscles intact Neck: supple Heart: regular rate and rhythm Lungs: clear to auscultation bilaterally Abdomen: soft; obese; nontender; bowel sounds present Rectal: Normal sphincter tone; unable to palpate prostate due to body habitus Extremities: No deformity; full range of motion; pulses normal; trace edema of lower legs Neurologic: Awake, alert and oriented; motor function intact in all extremities and symmetric; no facial droop Skin: Warm and dry Psychiatric: Normal mood and affect   RESULTS  Summary of this visit's results, reviewed by myself:   EKG Interpretation  Date/Time:    Ventricular Rate:    PR Interval:    QRS Duration:   QT Interval:    QTC Calculation:   R Axis:     Text Interpretation:        Laboratory Studies: Results for  orders placed or performed during the hospital encounter of 12/22/16 (from the past 24 hour(s))  Urinalysis, Routine w reflex microscopic     Status: Abnormal   Collection Time: 12/22/16  5:34 AM  Result Value Ref Range   Color, Urine YELLOW YELLOW   APPearance CLEAR CLEAR   Specific Gravity, Urine 1.031 (H) 1.005 - 1.030   pH 6.0 5.0 - 8.0   Glucose, UA >=500 (A) NEGATIVE mg/dL   Hgb urine dipstick NEGATIVE NEGATIVE   Bilirubin Urine NEGATIVE NEGATIVE   Ketones, ur NEGATIVE NEGATIVE mg/dL   Protein, ur NEGATIVE NEGATIVE mg/dL   Nitrite NEGATIVE NEGATIVE   Leukocytes,  UA NEGATIVE NEGATIVE  Urinalysis, Microscopic (reflex)     Status: Abnormal   Collection Time: 12/22/16  5:34 AM  Result Value Ref Range   RBC / HPF 0-5 0 - 5 RBC/hpf   WBC, UA 0-5 0 - 5 WBC/hpf   Bacteria, UA RARE (A) NONE SEEN   Squamous Epithelial / LPF 0-5 (A) NONE SEEN  CBG monitoring, ED     Status: Abnormal   Collection Time: 12/22/16  5:46 AM  Result Value Ref Range   Glucose-Capillary 413 (H) 65 - 99 mg/dL  CBC with Differential/Platelet     Status: None   Collection Time: 12/22/16  5:49 AM  Result Value Ref Range   WBC 9.6 4.0 - 10.5 K/uL   RBC 4.98 4.22 - 5.81 MIL/uL   Hemoglobin 15.4 13.0 - 17.0 g/dL   HCT 44.2 39.0 - 52.0 %   MCV 88.8 78.0 - 100.0 fL   MCH 30.9 26.0 - 34.0 pg   MCHC 34.8 30.0 - 36.0 g/dL   RDW 13.7 11.5 - 15.5 %   Platelets 272 150 - 400 K/uL   Neutrophils Relative % 68 %   Neutro Abs 6.6 1.7 - 7.7 K/uL   Lymphocytes Relative 20 %   Lymphs Abs 1.9 0.7 - 4.0 K/uL   Monocytes Relative 10 %   Monocytes Absolute 0.9 0.1 - 1.0 K/uL   Eosinophils Relative 2 %   Eosinophils Absolute 0.2 0.0 - 0.7 K/uL   Basophils Relative 0 %   Basophils Absolute 0.0 0.0 - 0.1 K/uL  Basic metabolic panel     Status: Abnormal   Collection Time: 12/22/16  5:49 AM  Result Value Ref Range   Sodium 134 (L) 135 - 145 mmol/L   Potassium 4.3 3.5 - 5.1 mmol/L   Chloride 101 101 - 111 mmol/L   CO2 23 22  - 32 mmol/L   Glucose, Bld 389 (H) 65 - 99 mg/dL   BUN 23 (H) 6 - 20 mg/dL   Creatinine, Ser 0.92 0.61 - 1.24 mg/dL   Calcium 10.7 (H) 8.9 - 10.3 mg/dL   GFR calc non Af Amer >60 >60 mL/min   GFR calc Af Amer >60 >60 mL/min   Anion gap 10 5 - 15  CBG monitoring, ED     Status: Abnormal   Collection Time: 12/22/16  6:55 AM  Result Value Ref Range   Glucose-Capillary 381 (H) 65 - 99 mg/dL   Imaging Studies: Dg Chest 2 View  Result Date: 12/22/2016 CLINICAL DATA:  Cough for 2 days. EXAM: CHEST  2 VIEW COMPARISON:  09/25/2016 FINDINGS: The lungs are clear. The pulmonary vasculature is normal. Heart size is normal. Hilar and mediastinal contours are unremarkable. There is no pleural effusion. IMPRESSION: No active cardiopulmonary disease. Electronically Signed   By: Andreas Newport M.D.   On: 12/22/2016 06:22    ED COURSE  Nursing notes and initial vitals signs, including pulse oximetry, reviewed.  Vitals:   12/22/16 0537 12/22/16 0541 12/22/16 0607  BP:  (!) 167/110   Pulse: 95    Temp: 97.9 F (36.6 C)    TempSrc: Oral    SpO2: 97%  100%   6:34 AM Air movement improved after albuterol inhaler treatment.I suspect patient's polyuria is due to his hyperglycemia. He was advised to check his sugar frequently and to be compliant with his medications. Because he is at risk for a pneumonia we will treat his bronchitis with an antibiotic. He is requesting amoxicillin as  [employer] will provide this to him for free.   PROCEDURES    ED DIAGNOSES     ICD-9-CM ICD-10-CM   1. Acute bronchitis with bronchospasm 466.0 J20.9   2. Hyperglycemia 790.29 R73.9        Shanon Rosser, MD 12/22/16 [phone]

## 2016-12-24 ENCOUNTER — Encounter: Payer: Self-pay | Admitting: Family Medicine

## 2016-12-25 NOTE — Telephone Encounter (Signed)
He really needs an appointment --- he no showed one  We can give him a sample of victoza if we have it but we will not be able to keep giving it to him  He will need to see endo once he has insurance

## 2017-01-04 ENCOUNTER — Telehealth: Payer: Self-pay | Admitting: Family Medicine

## 2017-01-04 DIAGNOSIS — IMO0002 Reserved for concepts with insufficient information to code with codable children: Secondary | ICD-10-CM

## 2017-01-04 DIAGNOSIS — E1165 Type 2 diabetes mellitus with hyperglycemia: Principal | ICD-10-CM

## 2017-01-04 DIAGNOSIS — E1151 Type 2 diabetes mellitus with diabetic peripheral angiopathy without gangrene: Secondary | ICD-10-CM

## 2017-01-04 MED ORDER — METFORMIN HCL 1000 MG PO TABS: 1000.0000 mg | ORAL_TABLET | Freq: Two times a day (BID) | ORAL | 2 refills | Status: DC

## 2017-01-04 NOTE — Telephone Encounter (Signed)
Refill done and patient notified. 

## 2017-01-04 NOTE — Telephone Encounter (Signed)
Relation to YT:RZNB Call back [place]: [place], [place] - [address] [phone] (Phone) [phone] (Fax)     Reason for call:  Patient requesting a refill metFORMIN (GLUCOPHAGE) 1000 MG tablet

## 2017-01-17 ENCOUNTER — Ambulatory Visit: Payer: Self-pay | Admitting: Family Medicine

## 2017-01-17 ENCOUNTER — Telehealth: Payer: Self-pay | Admitting: Family Medicine

## 2017-01-17 DIAGNOSIS — Z0289 Encounter for other administrative examinations: Secondary | ICD-10-CM

## 2017-01-17 NOTE — Telephone Encounter (Signed)
Patient cancelled 3:15pm appointment today due to work conflict,patient Tyler Brewer to [birth year] with PCP, charge or no charge

## 2017-01-22 ENCOUNTER — Ambulatory Visit: Payer: PRIVATE HEALTH INSURANCE | Admitting: Family Medicine

## 2017-01-22 DIAGNOSIS — Z0289 Encounter for other administrative examinations: Secondary | ICD-10-CM

## 2017-01-22 NOTE — Telephone Encounter (Signed)
charge 

## 2017-01-31 ENCOUNTER — Encounter: Payer: Self-pay | Admitting: Family Medicine

## 2017-01-31 NOTE — Telephone Encounter (Signed)
He definitely needs to be seen---  Give him info about payment plan-- but that wont help with meds-- [facility] [address] [facility]--- I believe they can help with meds too We can give insulin if we have it----  Maybe 70/30 start with 10 u bid --- we have sample to get him by until something can be worked out

## 2017-01-31 NOTE — Telephone Encounter (Signed)
Yes for now---- he does need to see someone though He should call about payment plan or call [facility]

## 2017-01-31 NOTE — Telephone Encounter (Signed)
Per PCP can give insulin 75/25- sample in fridge w/ Pt's name.

## 2017-02-11 ENCOUNTER — Encounter: Payer: Self-pay | Admitting: Family Medicine

## 2017-02-11 NOTE — Telephone Encounter (Signed)
Pt called regarding this issue as well stating his flex pen broke and he cannot use med. Please f/u.

## 2017-02-11 NOTE — Telephone Encounter (Signed)
Patient called back to follow up on request below. States he has not had Insulin since Saturday

## 2017-02-12 NOTE — Telephone Encounter (Signed)
Called to follow up with patient.  Left a message for call back.   

## 2017-02-13 NOTE — Telephone Encounter (Signed)
Attempted to reach pt and left message on cell# to call back or check mychart acct. Message sent via mychart.

## 2017-02-14 MED ORDER — INSULIN LISPRO PROT & LISPRO (75-25 MIX) 100 UNIT/ML KWIKPEN: 10.0000 [IU] | PEN_INJECTOR | Freq: Two times a day (BID) | SUBCUTANEOUS | 0 refills | Status: DC

## 2017-02-14 NOTE — Telephone Encounter (Signed)
Dr Carollee Herter-- please see pt's mychart message and advise?  I did not see humalog listed on pt's med list and wasn't sure what to give him? Saw patient email from 02/04/17 where he was given Humalog 75/25. 10 units twice a day. Will give another sample of that. Message sent to pt.

## 2017-02-19 ENCOUNTER — Telehealth: Payer: Self-pay | Admitting: Family

## 2017-02-21 MED ORDER — INSULIN LISPRO PROT & LISPRO (75-25 MIX) 100 UNIT/ML KWIKPEN: 10.0000 [IU] | PEN_INJECTOR | Freq: Two times a day (BID) | SUBCUTANEOUS | 0 refills | Status: DC

## 2017-02-21 NOTE — Telephone Encounter (Signed)
Patient came in to pick up insulin pen on 02/19/17.  Late entry.

## 2017-03-01 ENCOUNTER — Telehealth: Payer: Self-pay | Admitting: Family Medicine

## 2017-03-01 NOTE — Telephone Encounter (Signed)
Relation to HQ:UIQN Call back number:[phone]   Reason for call:  Patient requesting insulin samples and would like to pick up samples today, please advise

## 2017-03-01 NOTE — Telephone Encounter (Signed)
Patient requesting victoza sample.  Was told in message 12/24/16 to make appt, but has not yet. Advise if ok to give a sample.  (we do have samples)

## 2017-03-01 NOTE — Telephone Encounter (Signed)
Called the patient informed of PCP instructions. Scheduled OV next Friday.

## 2017-03-01 NOTE — Telephone Encounter (Signed)
We dont have sample -- he needs ov

## 2017-03-08 ENCOUNTER — Ambulatory Visit: Payer: PRIVATE HEALTH INSURANCE | Admitting: Family Medicine

## 2017-03-08 DIAGNOSIS — Z0289 Encounter for other administrative examinations: Secondary | ICD-10-CM

## 2017-04-25 ENCOUNTER — Encounter (HOSPITAL_BASED_OUTPATIENT_CLINIC_OR_DEPARTMENT_OTHER): Payer: Self-pay | Admitting: *Deleted

## 2017-04-25 ENCOUNTER — Emergency Department (HOSPITAL_BASED_OUTPATIENT_CLINIC_OR_DEPARTMENT_OTHER): Payer: PRIVATE HEALTH INSURANCE

## 2017-04-25 ENCOUNTER — Emergency Department (HOSPITAL_BASED_OUTPATIENT_CLINIC_OR_DEPARTMENT_OTHER)
Admission: EM | Admit: 2017-04-25 | Discharge: 2017-04-25 | Disposition: A | Discharge status: Home / Self Care | Payer: PRIVATE HEALTH INSURANCE | Attending: Emergency Medicine | Admitting: Emergency Medicine

## 2017-04-25 DIAGNOSIS — R0600 Dyspnea, unspecified: Secondary | ICD-10-CM

## 2017-04-25 DIAGNOSIS — Z794 Long term (current) use of insulin: Secondary | ICD-10-CM | POA: Insufficient documentation

## 2017-04-25 DIAGNOSIS — E119 Type 2 diabetes mellitus without complications: Secondary | ICD-10-CM | POA: Diagnosis not present

## 2017-04-25 DIAGNOSIS — I10 Essential (primary) hypertension: Secondary | ICD-10-CM | POA: Diagnosis not present

## 2017-04-25 DIAGNOSIS — R05 Cough: Secondary | ICD-10-CM | POA: Diagnosis present

## 2017-04-25 DIAGNOSIS — Z79899 Other long term (current) drug therapy: Secondary | ICD-10-CM | POA: Insufficient documentation

## 2017-04-25 DIAGNOSIS — F17200 Nicotine dependence, unspecified, uncomplicated: Secondary | ICD-10-CM | POA: Insufficient documentation

## 2017-04-25 DIAGNOSIS — I2510 Atherosclerotic heart disease of native coronary artery without angina pectoris: Secondary | ICD-10-CM | POA: Diagnosis not present

## 2017-04-25 DIAGNOSIS — R059 Cough, unspecified: Secondary | ICD-10-CM

## 2017-04-25 DIAGNOSIS — R0609 Other forms of dyspnea: Secondary | ICD-10-CM

## 2017-04-25 IMAGING — DX DG CHEST 2V
2 series · 2 of 2 positions shown · non-contrast
Comparison: [DATE]

CLINICAL DATA: Productive cough for several weeks. Now with
worsening symptoms.

EXAM:
CHEST  2 VIEW

[chest pa]
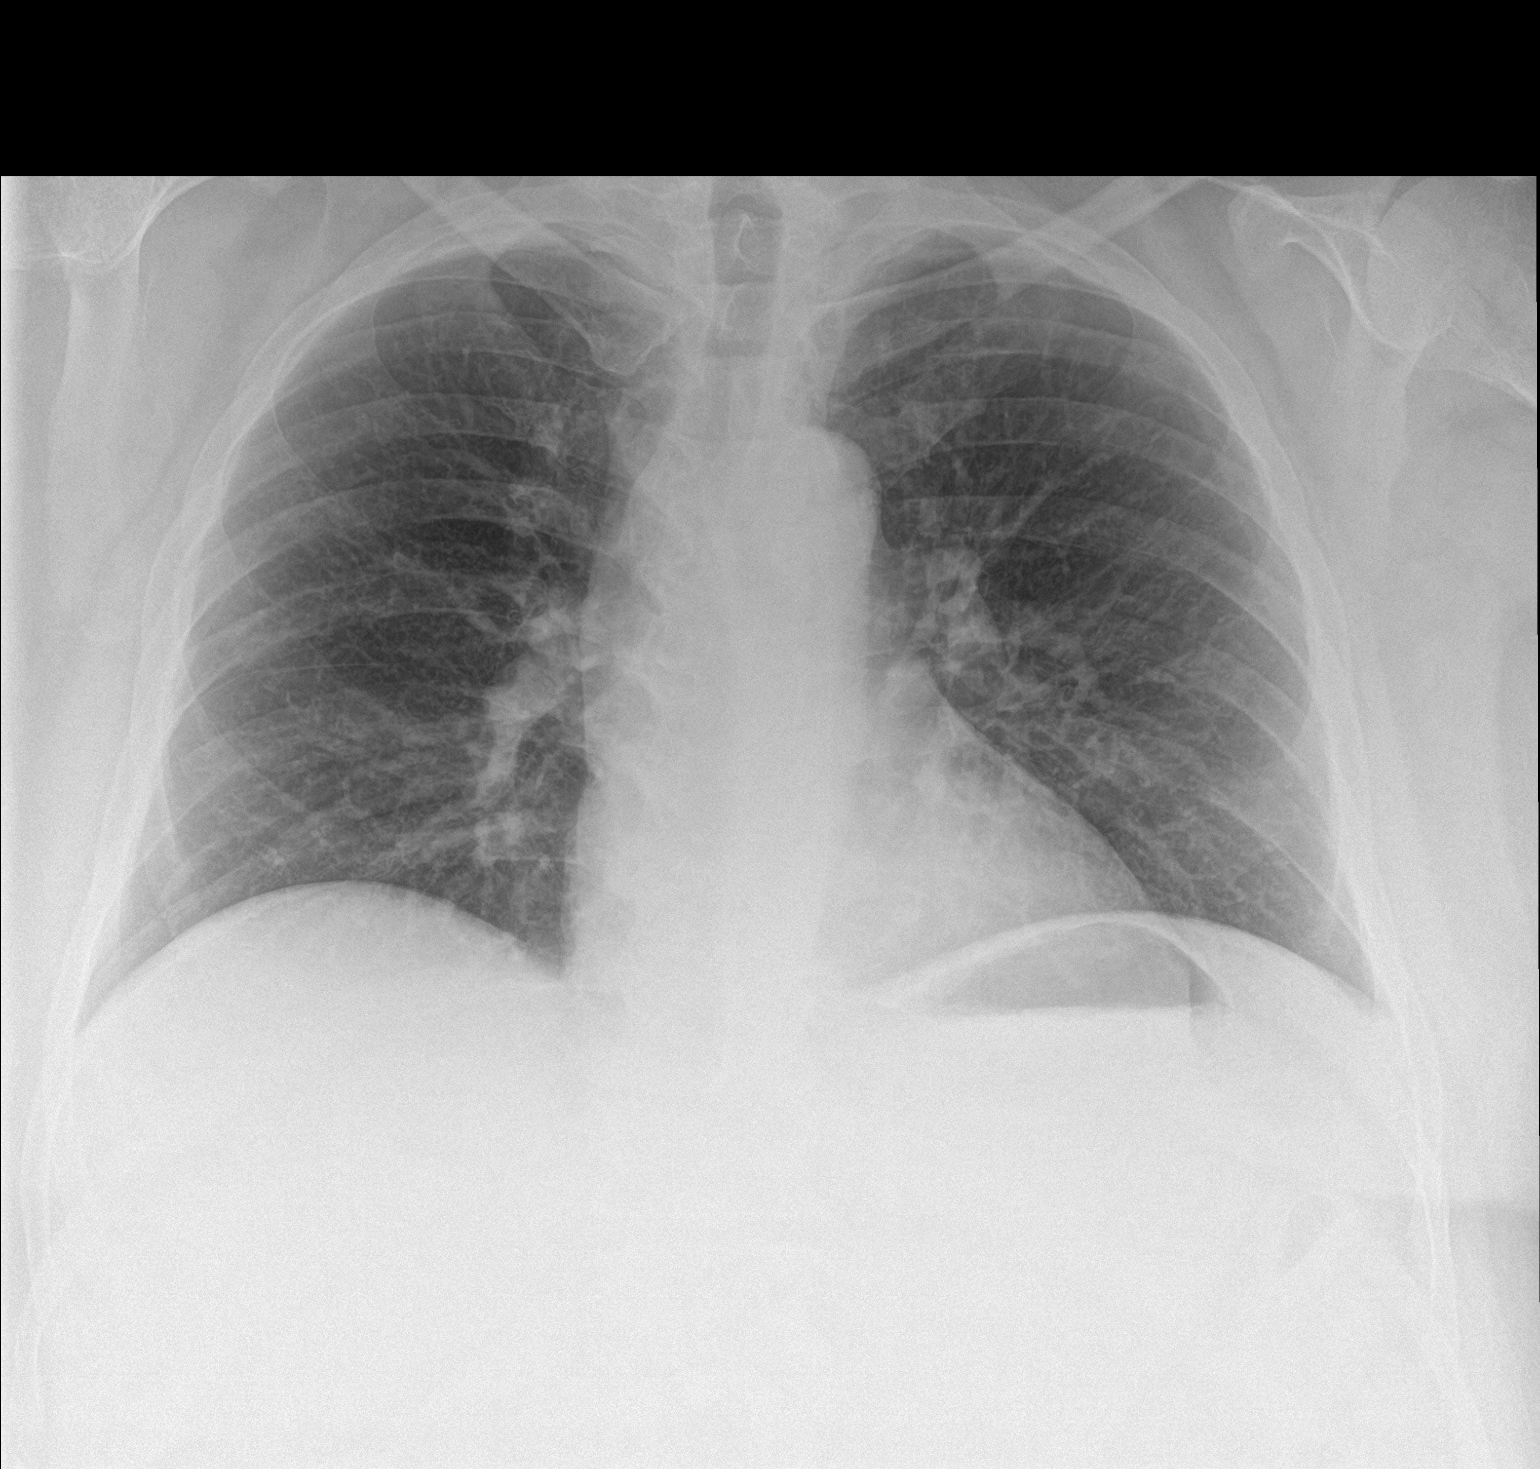

[chest lat]
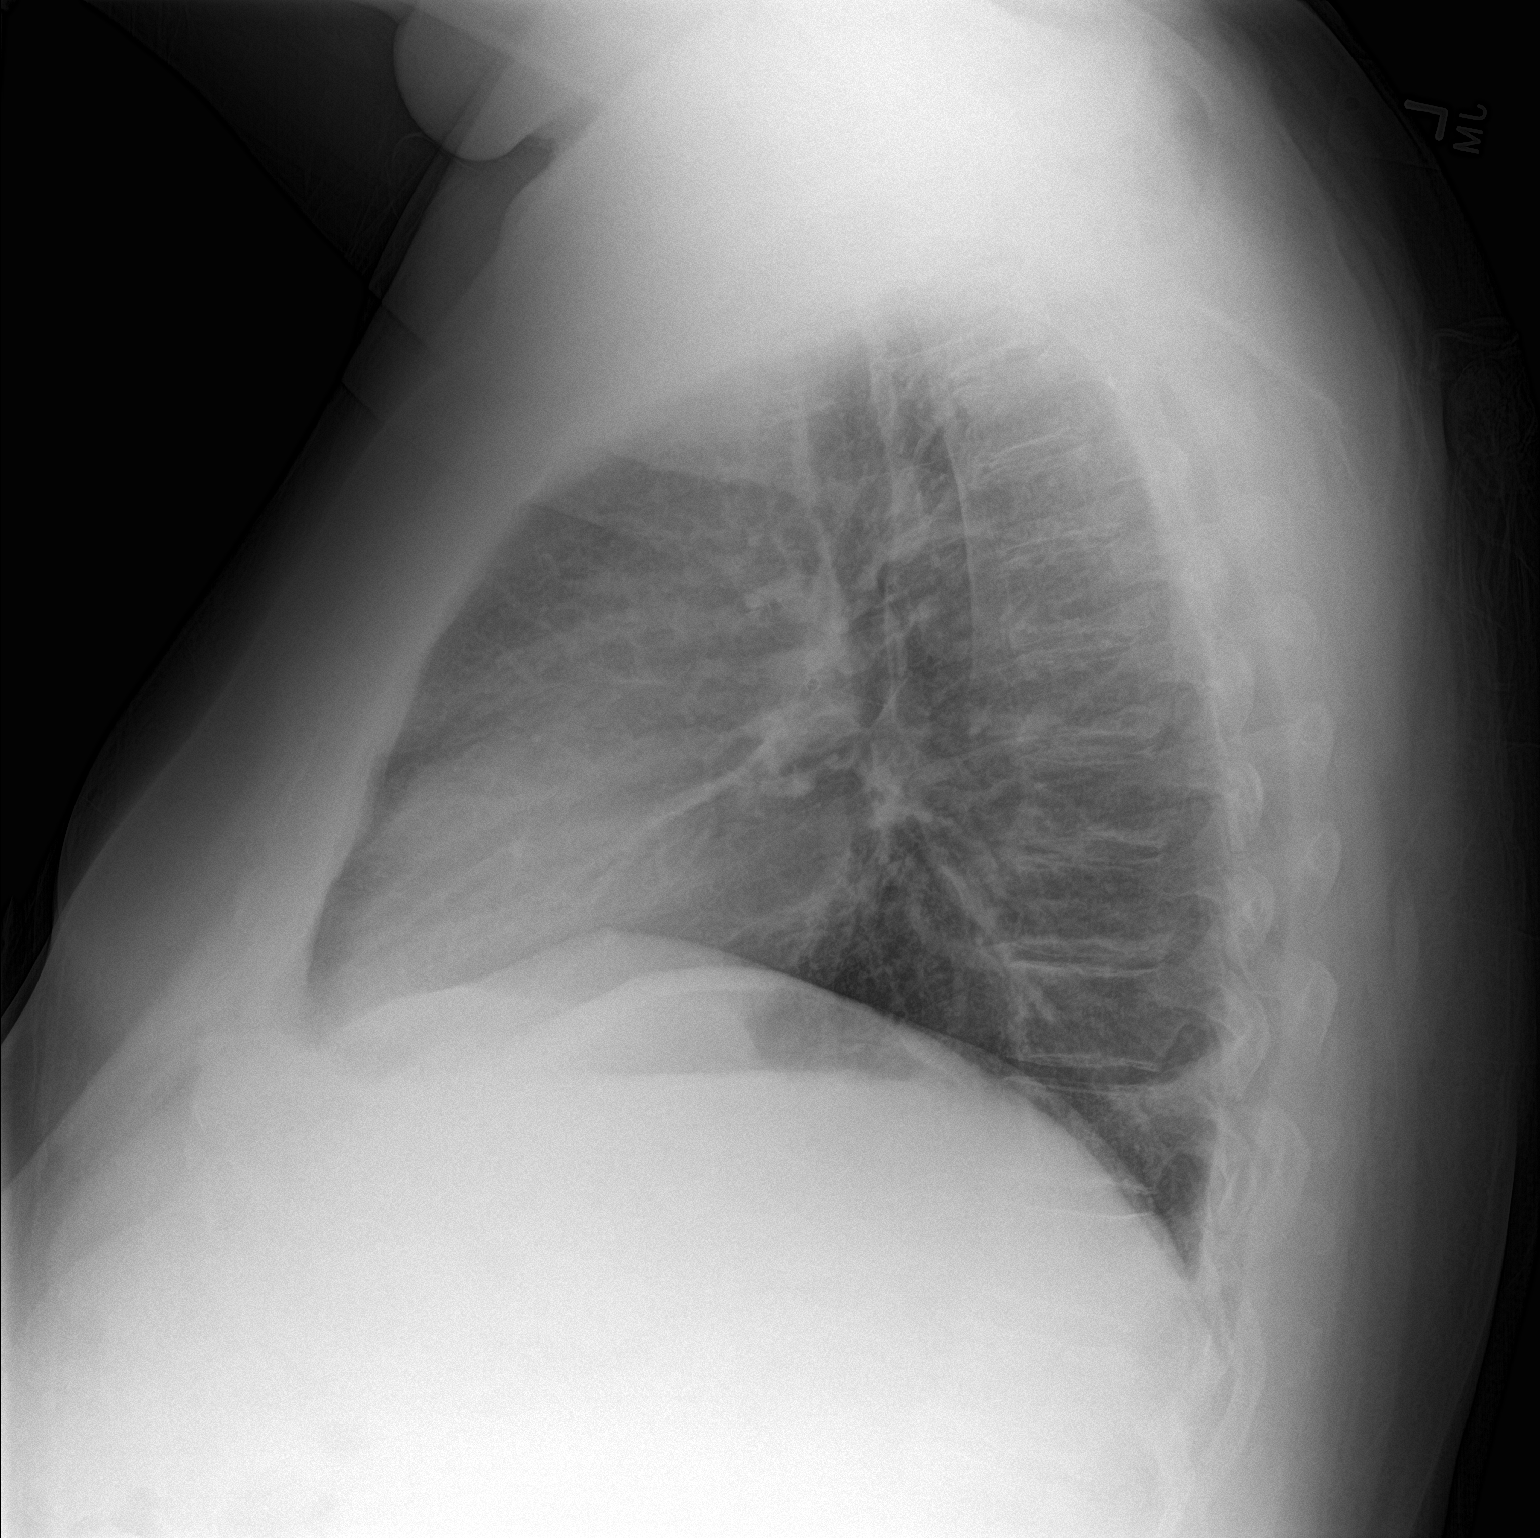

[2 of 2 positions shown; findings below may reference images not displayed]

FINDINGS: The lungs are clear without focal pneumonia, edema, pneumothorax or
pleural effusion. Interstitial markings are diffusely coarsened with
chronic features. The cardiopericardial silhouette is within normal
limits for size. The visualized bony structures of the thorax are
intact.
IMPRESSION: Chronic interstitial coarsening.  No acute cardiopulmonary findings.

## 2017-04-25 MED ORDER — BENZONATATE 100 MG PO CAPS: 100.0000 mg | ORAL_CAPSULE | Freq: Three times a day (TID) | ORAL | 0 refills | Status: DC | PRN

## 2017-04-25 MED ORDER — ALBUTEROL SULFATE HFA 108 (90 BASE) MCG/ACT IN AERS
1.0000 | INHALATION_SPRAY | Freq: Once | RESPIRATORY_TRACT | Status: AC
  Administered 2017-04-25: 1 via RESPIRATORY_TRACT
  Filled 2017-04-25: qty 6.7

## 2017-04-25 NOTE — ED Triage Notes (Signed)
Cough that is clear or milky looking per pt.

## 2017-04-25 NOTE — Discharge Instructions (Signed)
Take Tessalon up to 3 times daily for cough. He may use the inhaler as needed for shortness of breath. Follow up with a primary care physician or pulmonologist for reevaluation of your cough and shortness of breath. Return to the ED immediately if any concerning signs or symptoms develop such as fever, coughing up blood, chest pain, or persistent shortness of breath that does not improve with any medications.

## 2017-04-25 NOTE — ED Provider Notes (Signed)
[facility] DEPT [facility] Provider Note   CSN: [id number] Arrival date & time: 04/25/17  1352     History   Chief Complaint Chief Complaint  Patient presents with  . Cough    HPI Tyler Brewer is a 56 y.o. male with history of CAD, DM, HLD, HTN, OSA, substance abuse who presents today with chief complaint of acute onset, worsening productive cough for 1 month. He states that cough is productive of white milky sputum, but denies hemoptysis. He does endorse shortness of breath and dyspnea on exertion which resolves with rest. He states that when he lays back he does not feel short of breath but does feel "tickling" in his chest. He denies chest pain, diaphoresis, lightheadedness or headaches. He has had a few episodes of posttussive emesis due to the persistence of his cough.States he has chronic nasal congestion, but no worsening of his usual congestion. He has tried Tylenol without relief. He states he smokes 1 pack of cigarettes weekly on average, and occasionally smokes mouth.  Denies alcohol use or other recreational drug use. No recent travel or surgeries, no prior history of DVT or PE.   The history is provided by the patient.    Past Medical History:  Diagnosis Date  . CAD (coronary artery disease)  cardiac cath with moderate disease in a septal branch of the ramus intermedius 04/01/2012  . Depression   . Diabetes mellitus    poorly controlled by his report  . History of narcotic addiction ([place])    past history of back pain  . Hypercholesteremia   . Hypertension   . IBS (irritable bowel syndrome)   . Obesity    Max weight was 390  . OSA on CPAP   . Panic attacks   . Testosterone deficiency   . Vertigo     Patient Active Problem List   Diagnosis Date Noted  . Pansinusitis 09/26/2016  . Diabetic polyneuropathy associated with diabetes mellitus due to underlying condition ([place]) 05/08/2016  . Methamphetamine use disorder, severe, dependence ([place]) 02/11/2016  . Substance  induced mood disorder ([place]) 02/11/2016  . Exertional dyspnea 11/30/2015  . ADD (attention deficit disorder) 09/29/2015  . Binge eating 09/29/2015  . Syncope 05/05/2012  . Diarrhea 05/05/2012  . Panic attacks 05/05/2012  . OSA (obstructive sleep apnea) 05/05/2012  . HTN (hypertension) 05/05/2012  . Diabetes mellitus, type II ([place]) 05/05/2012  . Dyslipidemia 05/05/2012  . CAD (coronary artery disease)  cardiac cath with moderate disease in a septal branch of the ramus intermedius 04/01/2012  . Chest pain 04/01/2012  . Drug abuse and dependence ([place]) 04/01/2012  . Family history of coronary artery disease 03/31/2012  . Sleep apnea, on C-pap 03/31/2012  . Hyperlipemia 03/30/2012  . HTN (hypertension), benign 03/30/2012  . Morbid obesity ([place]) 03/30/2012  . DM type 2, uncontrolled, with neuropathy ([place]) 03/30/2012  . Depression with anxiety 03/30/2012    Past Surgical History:  Procedure Laterality Date  . CARDIAC CATHETERIZATION    . LEFT HEART CATHETERIZATION WITH CORONARY ANGIOGRAM N/A 03/31/2012   Procedure: LEFT HEART CATHETERIZATION WITH CORONARY ANGIOGRAM;  Surgeon: Leonie Man, MD;  Location: [facility] CATH LAB;  Service: Cardiovascular;  Laterality: N/A;       Home Medications    Prior to Admission medications   Medication Sig Start Date End Date Taking? Authorizing Provider  amLODipine (NORVASC) 10 MG tablet Take 1 tablet (10 mg total) by mouth daily. 12/11/16   [name], DO  amoxicillin (AMOXIL) 500  MG capsule Take 2 capsules (1,000 mg total) by mouth 2 (two) times daily. 12/22/16   Molpus, John, MD  B-D ULTRAFINE III SHORT PEN 31G X 8 MM MISC USE WITH VICTOZA AS INSTRUCTED 06/25/16   Carollee Herter, Alferd Apa, DO  benzonatate (TESSALON PERLES) 100 MG capsule Take 1 capsule (100 mg total) by mouth 3 (three) times daily as needed for cough. 04/25/17   Konrad Hoak A, PA-C  escitalopram (LEXAPRO) 20 MG tablet Take 1 tablet (20 mg total) by mouth daily. 09/26/16   [name] R, DO  fluticasone (FLONASE) 50 MCG/ACT nasal spray Place 2 sprays into both nostrils daily. 09/25/16   [name], DO  folic acid (FOLVITE) 1 MG tablet Take 1 mg by mouth daily.    [provider]  furosemide (LASIX) 40 MG tablet Take 1 tablet (40 mg total) by mouth 2 (two) times daily. 09/25/16   [name], DO  glipiZIDE (GLUCOTROL) 10 MG tablet Take 1 tablet (10 mg total) by mouth 2 (two) times daily before a meal. 12/04/16   Carollee Herter, Alferd Apa, DO  Insulin Lispro Prot & Lispro (HUMALOG MIX 75/25 KWIKPEN) (75-25) 100 UNIT/ML Kwikpen Inject 10 Units into the skin 2 (two) times daily. 02/21/17   Debbrah Alar, NP  liraglutide (VICTOZA) 18 MG/3ML SOPN 1.8 mg sq qd 09/25/16   Carollee Herter, Alferd Apa, DO  liraglutide (VICTOZA) 18 MG/3ML SOPN Inject 1.8 mg under the skin daily 10/16/16   Carollee Herter, Alferd Apa, DO  metFORMIN (GLUCOPHAGE) 1000 MG tablet Take 1 tablet (1,000 mg total) by mouth 2 (two) times daily with a meal. 01/04/17   Carollee Herter, Alferd Apa, DO  metoprolol succinate (TOPROL-XL) 100 MG 24 hr tablet Take 1 tablet (100 mg total) by mouth daily. Take with or immediately following a meal. 10/16/16   Carollee Herter, Alferd Apa, DO  nortriptyline (PAMELOR) 10 MG capsule Take 2 capsules (20 mg total) by mouth at bedtime. 10/26/16   [name], DO  spironolactone (ALDACTONE) 25 MG tablet Take 1 tablet (25 mg total) by mouth daily. 12/06/16   [name], DO  traMADol (ULTRAM) 50 MG tablet Take 1 tablet (50 mg total) by mouth every 8 (eight) hours as needed. 10/16/16   [name], DO  valsartan (DIOVAN) 160 MG tablet Take 1 tablet (160 mg total) by mouth daily. 10/16/16   [name], DO  vitamin B-12 (CYANOCOBALAMIN) 1000 MCG tablet Take 1,000 mcg by mouth daily.    [provider]    Family History Family History  Problem Relation Age of Onset  . Diabetes type II Father   . Hypertension Father   . Pancreatic disease  Father 20       Deceased  . Healthy Mother   . Healthy Sister   . Healthy Son   . Healthy Daughter     Social History Social History  Substance Use Topics  . Smoking status: Light Tobacco Smoker    Last attempt to quit: 11/21/2015  . Smokeless tobacco: Never Used  . Alcohol use No     Comment: Rare     Allergies   Patient has no known allergies.   Review of Systems Review of Systems  Constitutional: Negative for activity change, chills, fever and unexpected weight change.  HENT: Negative for congestion, sinus pain and sinus pressure.   Respiratory: Positive for cough and shortness of breath.  No hemoptysis  Cardiovascular: Negative for chest pain, palpitations and leg swelling.  Gastrointestinal: Positive for vomiting. Negative for abdominal pain, constipation, diarrhea and nausea.  Genitourinary: Negative for dysuria and hematuria.  Neurological: Negative for syncope and headaches.     Physical Exam Updated Vital Signs BP (!) 141/99   Pulse 90   Temp 97.8 F (36.6 C) (Oral)   Resp 20   Ht 5\' 11"  (1.803 m)   Wt (!) 148.1 kg (326 lb 8 oz)   SpO2 100%   BMI 45.54 kg/m   Physical Exam  Constitutional: He appears well-developed and well-nourished. No distress.  Obese, resting comfortably in bed  HENT:  Head: Normocephalic and atraumatic.  Right Ear: External ear normal.  Left Ear: External ear normal.  Mouth/Throat: Oropharynx is clear and moist.  No frontal or maxillary sinus TTP, nasal septum is midline with pink mucosa and mild congestion. Posterior face without erythema, tonsillar hypertrophy, exudates, or uvular deviation  Eyes: Conjunctivae are normal. Right eye exhibits no discharge. Left eye exhibits no discharge.  Neck: Normal range of motion. Neck supple. No JVD present. No tracheal deviation present.  Cardiovascular: Normal rate, regular rhythm, normal heart sounds and intact distal pulses.   2+ radial and DP/PT pulses bl, negative Homan's bl     Pulmonary/Chest: Effort normal. No respiratory distress. He has no wheezes. He has no rales. He exhibits no tenderness.  Globally mildly diminished breath sounds, equal rise and fall of chest, no increased work of breathing  Abdominal: Soft. Bowel sounds are normal. He exhibits no distension. There is no tenderness.  Musculoskeletal: He exhibits no edema.  Neurological: He is alert.  Skin: Skin is warm and dry. No erythema.  Psychiatric: He has a normal mood and affect. His behavior is normal.  Nursing note and vitals reviewed.    ED Treatments / Results  Labs (all labs ordered are listed, but only abnormal results are displayed) Labs Reviewed - No data to display  EKG  EKG Interpretation None       Radiology Dg Chest 2 View  Result Date: 04/25/2017 CLINICAL DATA:  Productive cough for several weeks. Now with worsening symptoms. EXAM: CHEST  2 VIEW COMPARISON:  02/12/2017 FINDINGS: The lungs are clear without focal pneumonia, edema, pneumothorax or pleural effusion. Interstitial markings are diffusely coarsened with chronic features. The cardiopericardial silhouette is within normal limits for size. The visualized bony structures of the thorax are intact. IMPRESSION: Chronic interstitial coarsening.  No acute cardiopulmonary findings. Electronically Signed   By: Misty Stanley M.D.   On: 04/25/2017 14:28    Procedures Procedures (including critical care time)  Medications Ordered in ED Medications  albuterol (PROVENTIL HFA;VENTOLIN HFA) 108 (90 Base) MCG/ACT inhaler 1 puff (1 puff Inhalation Given 04/25/17 1504)     Initial Impression / Assessment and Plan / ED Course  I have reviewed the triage vital signs and the nursing notes.  Pertinent labs & imaging results that were available during my care of the patient were reviewed by me and considered in my medical decision making (see chart for details).     Patient with chronic cough for 1 month. Afebrile, vital signs are at  patient's baseline. SPO2 100% on room air and he has no increased work of breathing. His chest x-ray shows chronic interstitial coarsening but no evidence of acute cardiopulmonary abnormality. Doubt PE in the absence of tachycardia, hemoptysis, or hypoxia and without any chest pain. No evidence of pneumonia or bronchitis. No evidence of  sinusitis. He has a long-standing history of methamphetamine and cigarettes smoking which may be contributing to his symptoms. No further emergent workup required at this time. He is stable for discharge home with follow-up with primary care physician for reevaluation of his symptoms and possible referral to pulmonology. Will discharge with Tessalon and albuterol inhaler which have been helpful to him in the past. Discussed indications for return to the ED. Pt verbalized understanding of and agreement with plan and is safe for discharge home at this time.   Final Clinical Impressions(s) / ED Diagnoses   Final diagnoses:  Cough  DOE (dyspnea on exertion)    New Prescriptions Discharge Medication List as of 04/25/2017  2:57 PM    START taking these medications   Details  benzonatate (TESSALON PERLES) 100 MG capsule Take 1 capsule (100 mg total) by mouth 3 (three) times daily as needed for cough., Starting Thu 04/25/2017, Print         Shariyah Eland, [place] A, PA-C 04/25/17 3291    Malvin Johns, MD 04/26/17 1558

## 2017-05-02 ENCOUNTER — Encounter: Payer: Self-pay | Admitting: Family Medicine

## 2017-05-02 ENCOUNTER — Ambulatory Visit (INDEPENDENT_AMBULATORY_CARE_PROVIDER_SITE_OTHER): Payer: Self-pay | Admitting: Family Medicine

## 2017-05-02 VITALS — BP 118/76 | HR 84 | Temp 97.9°F | Ht 71.0 in | Wt 330.0 lb

## 2017-05-02 DIAGNOSIS — E1165 Type 2 diabetes mellitus with hyperglycemia: Secondary | ICD-10-CM

## 2017-05-02 DIAGNOSIS — I10 Essential (primary) hypertension: Secondary | ICD-10-CM

## 2017-05-02 DIAGNOSIS — L4050 Arthropathic psoriasis, unspecified: Secondary | ICD-10-CM

## 2017-05-02 DIAGNOSIS — E1140 Type 2 diabetes mellitus with diabetic neuropathy, unspecified: Secondary | ICD-10-CM

## 2017-05-02 DIAGNOSIS — IMO0002 Reserved for concepts with insufficient information to code with codable children: Secondary | ICD-10-CM

## 2017-05-02 DIAGNOSIS — E785 Hyperlipidemia, unspecified: Secondary | ICD-10-CM

## 2017-05-02 DIAGNOSIS — E1151 Type 2 diabetes mellitus with diabetic peripheral angiopathy without gangrene: Secondary | ICD-10-CM

## 2017-05-02 DIAGNOSIS — J849 Interstitial pulmonary disease, unspecified: Secondary | ICD-10-CM

## 2017-05-02 DIAGNOSIS — R059 Cough, unspecified: Secondary | ICD-10-CM

## 2017-05-02 DIAGNOSIS — R05 Cough: Secondary | ICD-10-CM

## 2017-05-02 MED ORDER — BECLOMETHASONE DIPROPIONATE 40 MCG/ACT IN AERS: 2.0000 | INHALATION_SPRAY | Freq: Two times a day (BID) | RESPIRATORY_TRACT | 12 refills | Status: DC

## 2017-05-02 MED ORDER — AMLODIPINE BESYLATE 10 MG PO TABS: 10.0000 mg | ORAL_TABLET | Freq: Every day | ORAL | 6 refills | Status: DC

## 2017-05-02 MED ORDER — METFORMIN HCL 1000 MG PO TABS: 1000.0000 mg | ORAL_TABLET | Freq: Two times a day (BID) | ORAL | 2 refills | Status: DC

## 2017-05-02 MED ORDER — INSULIN LISPRO PROT & LISPRO (75-25 MIX) 100 UNIT/ML KWIKPEN: 10.0000 [IU] | PEN_INJECTOR | Freq: Two times a day (BID) | SUBCUTANEOUS | 0 refills | Status: DC

## 2017-05-02 MED ORDER — TRAMADOL HCL 50 MG PO TABS: 50.0000 mg | ORAL_TABLET | Freq: Three times a day (TID) | ORAL | 0 refills | Status: DC | PRN

## 2017-05-02 NOTE — Assessment & Plan Note (Signed)
Well controlled, no changes to meds. Encouraged heart healthy diet such as the DASH diet and exercise as tolerated.  °

## 2017-05-02 NOTE — Assessment & Plan Note (Signed)
Tolerating statin, encouraged heart healthy diet, avoid trans fats, minimize simple carbs and saturated fats. Increase exercise as tolerated 

## 2017-05-02 NOTE — Progress Notes (Signed)
Patient ID: Tyler Brewer, male    DOB: Mar 05, 1961  Age: 56 y.o. MRN: [id number]    Subjective:  Subjective  HPI [name] presents for f/u er for cough.  He was diagnosed with interstitial lung dz and told to f/u here and pulmonary.  The inh given to him in er does not help.   He also needs f/u dm, bp and cholesterol.  He has lost 25 lbs since seen last.  He had lost his job but is hoping to start a new one soon.   Review of Systems  Constitutional: Negative for appetite change, chills, diaphoresis, fatigue, fever and unexpected weight change.  HENT: Positive for congestion, postnasal drip, rhinorrhea and sinus pressure.   Eyes: Negative for pain, redness and visual disturbance.  Respiratory: Positive for cough, shortness of breath and wheezing. Negative for chest tightness.   Cardiovascular: Negative for chest pain, palpitations and leg swelling.  Endocrine: Negative for cold intolerance, heat intolerance, polydipsia, polyphagia and polyuria.  Genitourinary: Negative for difficulty urinating, dysuria and frequency.  Allergic/Immunologic: Negative for environmental allergies.  Neurological: Negative for dizziness, light-headedness, numbness and headaches.    History Past Medical History:  Diagnosis Date  . CAD (coronary artery disease)  cardiac cath with moderate disease in a septal branch of the ramus intermedius 04/01/2012  . Depression   . Diabetes mellitus    poorly controlled by his report  . History of narcotic addiction ([place])    past history of back pain  . Hypercholesteremia   . Hypertension   . IBS (irritable bowel syndrome)   . Obesity    Max weight was 390  . OSA on CPAP   . Panic attacks   . Testosterone deficiency   . Vertigo     He has a past surgical history that includes Cardiac catheterization and left heart catheterization with coronary angiogram (N/A, 03/31/2012).   His family history includes Diabetes type II in his father; Healthy in his daughter,  mother, sister, and son; Hypertension in his father; Pancreatic disease (age of onset: 70) in his father.He reports that he has been smoking.  He has never used smokeless tobacco. He reports that he uses drugs, including Methamphetamines. He reports that he does not drink alcohol.  Current Outpatient Prescriptions on File Prior to Visit  Medication Sig Dispense Refill  . B-D ULTRAFINE III SHORT PEN 31G X 8 MM MISC USE WITH VICTOZA AS INSTRUCTED 100 each 0  . benzonatate (TESSALON PERLES) 100 MG capsule Take 1 capsule (100 mg total) by mouth 3 (three) times daily as needed for cough. 30 capsule 0   No current facility-administered medications on file prior to visit.      Objective:  Objective  Physical Exam  Constitutional: He is oriented to person, place, and time. He appears well-developed and well-nourished. No distress.  HENT:  Head: Normocephalic and atraumatic.  Right Ear: External ear normal.  Left Ear: External ear normal.  No TTP over sinuses  Eyes: Pupils are equal, round, and reactive to light. Conjunctivae and EOM are normal. Right eye exhibits no discharge. Left eye exhibits no discharge.  Neck: Normal range of motion. Neck supple.  Cardiovascular: Normal rate, regular rhythm and normal heart sounds.   No murmur heard. Pulmonary/Chest: Effort normal and breath sounds normal. No respiratory distress. He has no wheezes. He has no rales. He exhibits no tenderness.  Musculoskeletal: He exhibits no edema.  Lymphadenopathy:    He has no cervical adenopathy.  Neurological: He is alert  and oriented to person, place, and time.  Skin: Skin is warm and dry.  Nursing note and vitals reviewed. Sensory exam of the foot is normal, tested with the monofilament. Good pulses, no lesions or ulcers, good peripheral pulses.  BP 118/76 (BP Location: Right Arm, Patient Position: Sitting, Cuff Size: Large)   Pulse 84   Temp 97.9 F (36.6 C) (Oral)   Ht 5\' 11"  (1.803 m)   Wt (!) 330 lb (149.7  kg)   SpO2 98%   BMI 46.03 kg/m  Wt Readings from Last 3 Encounters:  05/02/17 (!) 330 lb (149.7 kg)  04/25/17 (!) 326 lb 8 oz (148.1 kg)  12/10/16 (!) 340 lb (154.2 kg)     Lab Results  Component Value Date   WBC 9.6 12/22/2016   HGB 15.4 12/22/2016   HCT 44.2 12/22/2016   PLT 272 12/22/2016   GLUCOSE 389 (H) 12/22/2016   CHOL 228 (H) 09/25/2016   TRIG 233.0 (H) 09/25/2016   HDL 51.80 09/25/2016   LDLDIRECT 146.0 09/25/2016   LDLCALC 117 (H) 04/25/2016   ALT 31 09/25/2016   AST 18 09/25/2016   NA 134 (L) 12/22/2016   K 4.3 12/22/2016   CL 101 12/22/2016   CREATININE 0.92 12/22/2016   BUN 23 (H) 12/22/2016   CO2 23 12/22/2016   TSH 2.89 04/25/2016   INR 1.1 07/30/2013   HGBA1C 8.8 (H) 09/25/2016   MICROALBUR 1.5 09/25/2016    Dg Chest 2 View  Result Date: 04/25/2017 CLINICAL DATA:  Productive cough for several weeks. Now with worsening symptoms. EXAM: CHEST  2 VIEW COMPARISON:  02/12/2017 FINDINGS: The lungs are clear without focal pneumonia, edema, pneumothorax or pleural effusion. Interstitial markings are diffusely coarsened with chronic features. The cardiopericardial silhouette is within normal limits for size. The visualized bony structures of the thorax are intact. IMPRESSION: Chronic interstitial coarsening.  No acute cardiopulmonary findings. Electronically Signed   By: Misty Stanley M.D.   On: 04/25/2017 14:28     Assessment & Plan:  Plan  I have discontinued [name] liraglutide, furosemide, fluticasone, escitalopram, folic acid, vitamin U-23, liraglutide, metoprolol succinate, valsartan, nortriptyline, glipiZIDE, spironolactone, and amoxicillin. I am also having him maintain his B-D ULTRAFINE III SHORT PEN, benzonatate, traMADol, metFORMIN, Insulin Lispro Prot & Lispro, beclomethasone, and amLODipine.  Meds ordered this encounter  Medications  . DISCONTD: amLODipine (NORVASC) 10 MG tablet    Sig: Take 1 tablet (10 mg total) by mouth daily.     Dispense:  30 tablet    Refill:  6  . DISCONTD: Insulin Lispro Prot & Lispro (HUMALOG MIX 75/25 KWIKPEN) (75-25) 100 UNIT/ML Kwikpen    Sig: Inject 10 Units into the skin 2 (two) times daily.    Dispense:  3 mL    Refill:  0    Order Specific Question:   Lot Number?    Answer:   [id number]    Order Specific Question:   Expiration Date?    Answer:   02/17/2018    Order Specific Question:   Quantity    Answer:   1    Comments:   pen  . DISCONTD: metFORMIN (GLUCOPHAGE) 1000 MG tablet    Sig: Take 1 tablet (1,000 mg total) by mouth 2 (two) times daily with a meal.    Dispense:  60 tablet    Refill:  2  . DISCONTD: traMADol (ULTRAM) 50 MG tablet    Sig: Take 1 tablet (50 mg total) by mouth every 8 (eight)  hours as needed.    Dispense:  60 tablet    Refill:  0  . DISCONTD: beclomethasone (QVAR) 40 MCG/ACT inhaler    Sig: Inhale 2 puffs into the lungs 2 (two) times daily.    Dispense:  1 Inhaler    Refill:  12  . traMADol (ULTRAM) 50 MG tablet    Sig: Take 1 tablet (50 mg total) by mouth every 8 (eight) hours as needed.    Dispense:  60 tablet    Refill:  0  . metFORMIN (GLUCOPHAGE) 1000 MG tablet    Sig: Take 1 tablet (1,000 mg total) by mouth 2 (two) times daily with a meal.    Dispense:  60 tablet    Refill:  2  . Insulin Lispro Prot & Lispro (HUMALOG MIX 75/25 KWIKPEN) (75-25) 100 UNIT/ML Kwikpen    Sig: Inject 10 Units into the skin 2 (two) times daily.    Dispense:  3 mL    Refill:  0    Order Specific Question:   Lot Number?    Answer:   [id number]    Order Specific Question:   Expiration Date?    Answer:   02/17/2018    Order Specific Question:   Quantity    Answer:   1    Comments:   pen  . beclomethasone (QVAR) 40 MCG/ACT inhaler    Sig: Inhale 2 puffs into the lungs 2 (two) times daily.    Dispense:  1 Inhaler    Refill:  12  . amLODipine (NORVASC) 10 MG tablet    Sig: Take 1 tablet (10 mg total) by mouth daily.    Dispense:  30 tablet    Refill:  6    Problem List  Items Addressed This Visit      Unprioritized   HTN (hypertension) (Chronic)   Relevant Medications   amLODipine (NORVASC) 10 MG tablet   Other Relevant Orders   Comprehensive metabolic panel   DM type 2, uncontrolled, 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) (Chronic)    hgba1c to be checked, minimize simple carbs. Increase exercise as tolerated. Continue current meds       Relevant Medications   metFORMIN (GLUCOPHAGE) 1000 MG tablet   Insulin Lispro Prot & Lispro (HUMALOG MIX 75/25 KWIKPEN) (75-25) 100 UNIT/ML Kwikpen   HTN (hypertension), benign (Chronic)    Well controlled, no changes to meds. Encouraged heart healthy diet such as the DASH diet and exercise as tolerated.        Relevant Medications   amLODipine (NORVASC) 10 MG tablet   Hyperlipemia (Chronic)    Tolerating statin, encouraged heart healthy diet, avoid trans fats, minimize simple carbs and saturated fats. Increase exercise as tolerated      Relevant Medications   amLODipine (NORVASC) 10 MG tablet   Interstitial lung disease (HCC)    con't albuterol Add qvar Refer to pulmonary      Relevant Medications   beclomethasone (QVAR) 40 MCG/ACT inhaler   Other Relevant Orders   Ambulatory referral to Pulmonology    Other Visit Diagnoses    Uncontrolled type 2 diabetes mellitus with hyperglycemia, without long-term current use of insulin (HCC)    -  Primary   Relevant Medications   metFORMIN (GLUCOPHAGE) 1000 MG tablet   Insulin Lispro Prot & Lispro (HUMALOG MIX 75/25 KWIKPEN) (75-25) 100 UNIT/ML Kwikpen   Other Relevant Orders   Lipid panel   Hemoglobin A1c   Comprehensive metabolic panel   Microalbumin / creatinine urine ratio  Hyperlipidemia LDL goal <70       Relevant Medications   amLODipine (NORVASC) 10 MG tablet   Other Relevant Orders   Lipid panel   Comprehensive metabolic panel   DM (diabetes mellitus) type II uncontrolled, periph vascular disorder (HCC)       Relevant Medications   metFORMIN (GLUCOPHAGE)  1000 MG tablet   Insulin Lispro Prot & Lispro (HUMALOG MIX 75/25 KWIKPEN) (75-25) 100 UNIT/ML Kwikpen   amLODipine (NORVASC) 10 MG tablet   Psoriatic arthritis (HCC)       Relevant Medications   traMADol (ULTRAM) 50 MG tablet   Cough       Relevant Orders   Ambulatory referral to Pulmonology      Follow-up: Return in about 3 months (around 08/01/2017).  [name], DO

## 2017-05-02 NOTE — Telephone Encounter (Signed)
[place] office can help him with that

## 2017-05-02 NOTE — Assessment & Plan Note (Signed)
hgba1c to be checked, minimize simple carbs. Increase exercise as tolerated. Continue current meds  

## 2017-05-02 NOTE — Assessment & Plan Note (Signed)
con't albuterol Add qvar Refer to pulmonary

## 2017-05-02 NOTE — Patient Instructions (Signed)
Cough, Adult Coughing is a reflex that clears your throat and your airways. Coughing helps to heal and protect your lungs. It is normal to cough occasionally, but a cough that happens with other symptoms or lasts a long time may be a sign of a condition that needs treatment. A cough may last only 2-3 weeks (acute), or it may last longer than 8 weeks (chronic). What are the causes? Coughing is commonly caused by:  Breathing in substances that irritate your lungs.  A viral or bacterial respiratory infection.  Allergies.  Asthma.  Postnasal drip.  Smoking.  Acid backing up from the stomach into the esophagus (gastroesophageal reflux).  Certain medicines.  Chronic lung problems, including COPD (or rarely, lung cancer).  Other medical conditions such as heart failure.  Follow these instructions at home: Pay attention to any changes in your symptoms. Take these actions to help with your discomfort:  Take medicines only as told by your health care provider. ? If you were prescribed an antibiotic medicine, take it as told by your health care provider. Do not stop taking the antibiotic even if you start to feel better. ? Talk with your health care provider before you take a cough suppressant medicine.  Drink enough fluid to keep your urine clear or pale yellow.  If the air is dry, use a cold steam vaporizer or humidifier in your bedroom or your home to help loosen secretions.  Avoid anything that causes you to cough at work or at home.  If your cough is worse at night, try sleeping in a semi-upright position.  Avoid cigarette smoke. If you smoke, quit smoking. If you need help quitting, ask your health care provider.  Avoid caffeine.  Avoid alcohol.  Rest as needed.  Contact a health care provider if:  You have new symptoms.  You cough up pus.  Your cough does not get better after 2-3 weeks, or your cough gets worse.  You cannot control your cough with suppressant  medicines and you are losing sleep.  You develop pain that is getting worse or pain that is not controlled with pain medicines.  You have a fever.  You have unexplained weight loss.  You have night sweats. Get help right away if:  You cough up blood.  You have difficulty breathing.  Your heartbeat is very fast. This information is not intended to replace advice given to you by your health care provider. Make sure you discuss any questions you have with your health care provider. Document Released: 02/02/2011 Document Revised: 01/12/2016 Document Reviewed: 10/13/2014 Elsevier Interactive Patient Education  2017 Elsevier Inc.  

## 2017-05-03 LAB — COMPREHENSIVE METABOLIC PANEL
ALT: 23 U/L (ref 0–53)
AST: 16 U/L (ref 0–37)
Albumin: 3.8 g/dL (ref 3.5–5.2)
Alkaline Phosphatase: 78 U/L (ref 39–117)
BUN: 18 mg/dL (ref 6–23)
CO2: 23 mEq/L (ref 19–32)
Calcium: 9.5 mg/dL (ref 8.4–10.5)
Chloride: 99 mEq/L (ref 96–112)
Creatinine, Ser: 0.91 mg/dL (ref 0.40–1.50)
GFR: 92.29 mL/min (ref >60.00)
Glucose, Bld: 324 mg/dL — ABNORMAL HIGH (ref 70–99)
Potassium: 4.3 mEq/L (ref 3.5–5.1)
Sodium: 133 mEq/L — ABNORMAL LOW (ref 135–145)
Total Bilirubin: 0.3 mg/dL (ref 0.2–1.2)
Total Protein: 6.8 g/dL (ref 6.0–8.3)

## 2017-05-03 LAB — LIPID PANEL
Cholesterol: 210 mg/dL — ABNORMAL HIGH (ref 0–200)
HDL: 59.5 mg/dL (ref >39.00)
NonHDL: 150.78
Total CHOL/HDL Ratio: 4
Triglycerides: 356 mg/dL — ABNORMAL HIGH (ref 0.0–149.0)
VLDL: 71.2 mg/dL — ABNORMAL HIGH (ref 0.0–40.0)

## 2017-05-03 LAB — MICROALBUMIN / CREATININE URINE RATIO
Creatinine,U: 79.9 mg/dL
Microalb Creat Ratio: 0.9 mg/g (ref 0.0–30.0)
Microalb, Ur: <0.7 mg/dL (ref 0.0–1.9)

## 2017-05-03 LAB — HEMOGLOBIN A1C: Hgb A1c MFr Bld: 10.8 % — ABNORMAL HIGH (ref 4.6–6.5)

## 2017-05-03 LAB — LDL CHOLESTEROL, DIRECT: Direct LDL: 124 mg/dL

## 2017-05-06 ENCOUNTER — Other Ambulatory Visit: Payer: Self-pay | Admitting: Family Medicine

## 2017-05-06 ENCOUNTER — Encounter: Payer: Self-pay | Admitting: Family Medicine

## 2017-05-06 DIAGNOSIS — E1165 Type 2 diabetes mellitus with hyperglycemia: Secondary | ICD-10-CM

## 2017-05-06 DIAGNOSIS — E119 Type 2 diabetes mellitus without complications: Secondary | ICD-10-CM

## 2017-05-06 DIAGNOSIS — E118 Type 2 diabetes mellitus with unspecified complications: Secondary | ICD-10-CM

## 2017-05-06 DIAGNOSIS — E785 Hyperlipidemia, unspecified: Secondary | ICD-10-CM

## 2017-05-06 MED ORDER — PRAVASTATIN SODIUM 40 MG PO TABS: 40.0000 mg | ORAL_TABLET | Freq: Every day | ORAL | 4 refills | Status: DC

## 2017-05-23 ENCOUNTER — Institutional Professional Consult (permissible substitution): Payer: Self-pay | Admitting: Pulmonary Disease

## 2017-05-31 ENCOUNTER — Ambulatory Visit: Payer: Self-pay | Admitting: Endocrinology

## 2017-06-05 ENCOUNTER — Ambulatory Visit (INDEPENDENT_AMBULATORY_CARE_PROVIDER_SITE_OTHER): Payer: Self-pay | Admitting: Pulmonary Disease

## 2017-06-05 ENCOUNTER — Encounter: Payer: Self-pay | Admitting: Pulmonary Disease

## 2017-06-05 VITALS — BP 134/80 | HR 74 | Ht 71.0 in | Wt 337.8 lb

## 2017-06-05 DIAGNOSIS — R0602 Shortness of breath: Secondary | ICD-10-CM

## 2017-06-05 DIAGNOSIS — F17210 Nicotine dependence, cigarettes, uncomplicated: Secondary | ICD-10-CM

## 2017-06-05 DIAGNOSIS — R0609 Other forms of dyspnea: Secondary | ICD-10-CM

## 2017-06-05 DIAGNOSIS — R0600 Dyspnea, unspecified: Secondary | ICD-10-CM

## 2017-06-05 LAB — NITRIC OXIDE: Nitric Oxide: 14

## 2017-06-05 MED ORDER — ALBUTEROL SULFATE HFA 108 (90 BASE) MCG/ACT IN AERS: 2.0000 | INHALATION_SPRAY | Freq: Four times a day (QID) | RESPIRATORY_TRACT | 6 refills | Status: DC | PRN

## 2017-06-05 NOTE — Progress Notes (Signed)
Tyler Brewer    [id number]    19-Jul-1961  Primary Care Physician:Lowne Cheri Rous Alferd Apa, DO  Referring Physician: Carollee Herter, Alferd Apa, DO [address] [place], [place] [address]  Chief complaint: Follow-up for OSA, abnormal chest x-ray  HPI: 56 year old with history of hypertension, hyperlipidemia, obesity, OSA. He was previously followed for severe sleep apnea and is stable on AutoSet CPAP 5-15. He is using it every day, tolerating it well with no issues. He reports that he needs to to change the strap and is about to get in touch with his DME company  He was seen in the emergency room in early September 2018 for symptoms of upper respiratory tract infection with cough, dyspnea. He had a chest x-ray which showed some mild interstitial opacities. He was told that he had interstitial lung disease and started on albuterol. He was assessed at primary care who gave him Qvar but he has not started this yet. He's been smoking about half pack per day cigarettes for the past 4 years. He also smokes meth and states that his usage has picked up over the summer. States that he uses meth mostly to loose weight. He has stopped both cigarettes and meth since his ED visit.  He has mild dyspnea on exertion, occasional wheeze. Denies any cough, sputum production. Denies any joint pain, morning stiffness, rash.  Pets: None Occupation:Sells cars. Desk job Exposures: No known exposures, no mold Smoking history: Smokes meth and cigarettes. Off these since September 2018 Travel History: Not significant  Outpatient Encounter Prescriptions as of 06/05/2017  Medication Sig  . amLODipine (NORVASC) 10 MG tablet Take 1 tablet (10 mg total) by mouth daily.  . beclomethasone (QVAR) 40 MCG/ACT inhaler Inhale 2 puffs into the lungs 2 (two) times daily.  . Insulin Lispro Prot & Lispro (HUMALOG MIX 75/25 KWIKPEN) (75-25) 100 UNIT/ML Kwikpen Inject 10 Units into the skin 2 (two) times daily.  (Patient taking differently: Inject 35 Units into the skin 2 (two) times daily. )  . metFORMIN (GLUCOPHAGE) 1000 MG tablet Take 1 tablet (1,000 mg total) by mouth 2 (two) times daily with a meal.  . pravastatin (PRAVACHOL) 40 MG tablet Take 1 tablet (40 mg total) by mouth daily.  . traMADol (ULTRAM) 50 MG tablet Take 1 tablet (50 mg total) by mouth every 8 (eight) hours as needed.  . [DISCONTINUED] B-D ULTRAFINE III SHORT PEN 31G X 8 MM MISC USE WITH VICTOZA AS INSTRUCTED  . [DISCONTINUED] benzonatate (TESSALON PERLES) 100 MG capsule Take 1 capsule (100 mg total) by mouth 3 (three) times daily as needed for cough.   No facility-administered encounter medications on file as of 06/05/2017.     Allergies as of 06/05/2017  . (No Known Allergies)    Past Medical History:  Diagnosis Date  . CAD (coronary artery disease)  cardiac cath with moderate disease in a septal branch of the ramus intermedius 04/01/2012  . Depression   . Diabetes mellitus    poorly controlled by his report  . History of narcotic addiction ([place])    past history of back pain  . Hypercholesteremia   . Hypertension   . IBS (irritable bowel syndrome)   . Obesity    Max weight was 390  . OSA on CPAP   . Panic attacks   . Testosterone deficiency   . Vertigo     Past Surgical History:  Procedure Laterality Date  . CARDIAC CATHETERIZATION    .  LEFT HEART CATHETERIZATION WITH CORONARY ANGIOGRAM N/A 03/31/2012   Procedure: LEFT HEART CATHETERIZATION WITH CORONARY ANGIOGRAM;  Surgeon: Leonie Man, MD;  [facility] C[facility] LAB;  Service: Cardiovascular;  Laterality: N/A;    Family History  Problem Relation Age of Onset  . Diabetes type II Father   . Hypertension Father   . Pancreatic disease Father 37       Deceased  . Healthy Mother   . Healthy Sister   . Healthy Son   . Healthy Daughter     Social History   Social History  . Marital status: Divorced    Spouse name: N/A  . Number of children: 2  . Years  of education: N/A   Occupational History  . Not on file.   Social History Main Topics  . Smoking status: Light Tobacco Smoker    Last attempt to quit: 11/21/2015  . Smokeless tobacco: Never Used  . Alcohol use No     Comment: Rare  . Drug use: Yes    Types: Methamphetamines     Comment: last use in June 2017  . Sexual activity: Not Currently   Other Topics Concern  . Not on file   Social History Narrative   Lives with mother in a one story home.  Has 2 grown children.     Works as a Barrister's clerk.  Education: Oceanographer.    Review of systems: Review of Systems  Constitutional: Negative for fever and chills.  HENT: Negative.   Eyes: Negative for blurred vision.  Respiratory: as per HPI  Cardiovascular: Negative for chest pain and palpitations.  Gastrointestinal: Negative for vomiting, diarrhea, blood per rectum. Genitourinary: Negative for dysuria, urgency, frequency and hematuria.  Musculoskeletal: Negative for myalgias, back pain and joint pain.  Skin: Negative for itching and rash.  Neurological: Negative for dizziness, tremors, focal weakness, seizures and loss of consciousness.  Endo/Heme/Allergies: Negative for environmental allergies.  Psychiatric/Behavioral: Negative for depression, suicidal ideas and hallucinations.  All other systems reviewed and are negative.  Physical Exam: Blood pressure 134/80, pulse 74, height 5\' 11"  (1.803 m), weight (!) 337 lb 12.8 oz (153.2 kg), SpO2 98 %. Gen:      No acute distress HEENT:  EOMI, sclera anicteric Neck:     No masses; no thyromegaly Lungs:    Clear to auscultation bilaterally; normal respiratory effort CV:         Regular rate and rhythm; no murmurs Abd:      + bowel sounds; soft, non-tender; no palpable masses, no distension Ext:    No edema; adequate peripheral perfusion Skin:      Warm and dry; no rash Neuro: alert and oriented x 3 Psych: normal mood and affect  Data Reviewed: Chest x-ray 02/12/17-bronchial wall  thickening Chest x-ray 04/25/17-coarse interstitial thickening. I reviewed the images personally.  ANA, rheumatoid factor lung 07/03/16-negative  FENO 06/05/17- 14  Sleep study 12/08/15 Severe obstructive sleep apnea, AHI 31, severe oxygen desaturations to 85%.  CPAP download 05/20/17 Usage greater than 4 hours-90%, average usage 9 hours Set pressure 5-15, AHI 0.8  Assessment:  OSA Continues on AutoSet CPAP 5-15. He is tolerating this well with no issues. CPAP download shows good compliance and effectiveness.   Dyspnea, abnormal chest x-ray Chest X x-ray shows mild changes suggestive of bronchitis which may be secondary to viral infection or smoking. Suspicion for asthma is low and FENO is normal I don't suspect any interstitial lung disease as he is no exposures, symptoms are  not typical,  and ANA, rheumatoid factor were negative last year. Will evaluate further with high-resolution CT and PFTs I have asked him to continue the albuterol as needed. He does not need to be on Qvar inhaler.  Smoker Discussed the effect that smoking may be having on his lung function. Advised him to quit completely and not go back to smoking. Time spent counseling- 73mins  Plan/Recommendations: - Continue OSA - Continue albuterol, No need for qvar now - Check HRCT and PFTs - Smoking cessation.   Marshell Garfinkel MD [facility] Pager [phone] 06/05/2017, 9:04 AM  CC: [name], *

## 2017-06-05 NOTE — Patient Instructions (Addendum)
Please continue to stay off smoking. Continue using the albuterol inhaler and CPAP as directed You dont need Qvar inhaler We will further evaluate the lungs with a high-resolution CT of the chest and pulmonary function tests Return to clinic in 1-2 months.

## 2017-06-11 ENCOUNTER — Inpatient Hospital Stay: Admission: RE | Admit: 2017-06-11 | Payer: Medicaid Other | Source: Ambulatory Visit

## 2017-06-18 ENCOUNTER — Ambulatory Visit (INDEPENDENT_AMBULATORY_CARE_PROVIDER_SITE_OTHER)
Admission: RE | Admit: 2017-06-18 | Discharge: 2017-06-18 | Disposition: A | Discharge status: Home / Self Care | Payer: Self-pay | Source: Ambulatory Visit | Attending: Pulmonary Disease | Admitting: Pulmonary Disease

## 2017-06-18 DIAGNOSIS — R0602 Shortness of breath: Secondary | ICD-10-CM

## 2017-06-18 IMAGING — CT CT CHEST HIGH RESOLUTION W/O CM
2 of 6 series · 14 of 36 positions shown, 17 images · non-contrast
Comparison: [DATE] chest radiograph.

CLINICAL DATA: Shortness of breath.  Current smoker.

EXAM:
CT CHEST WITHOUT CONTRAST
TECHNIQUE: Multidetector CT imaging of the chest was performed following the
standard protocol without intravenous contrast. High resolution
imaging of the lungs, as well as inspiratory and expiratory imaging,
was performed.

[Series 2: high resolution · axial · 0.90mm/px · z∈[+988,+1242]mm · 11 of 143 slices shown, 14 images]
[im 8/143  mediastinal]
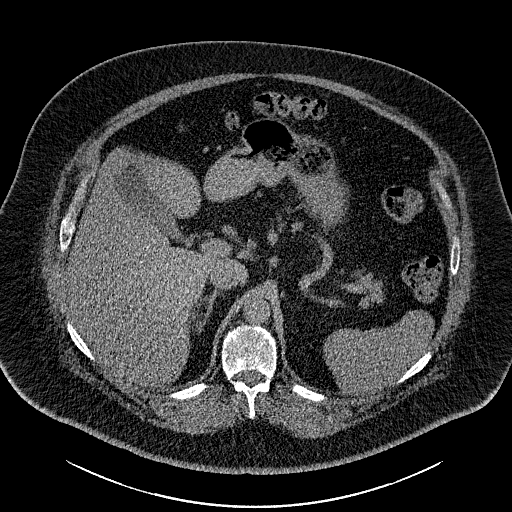
[im 8/143  lung]
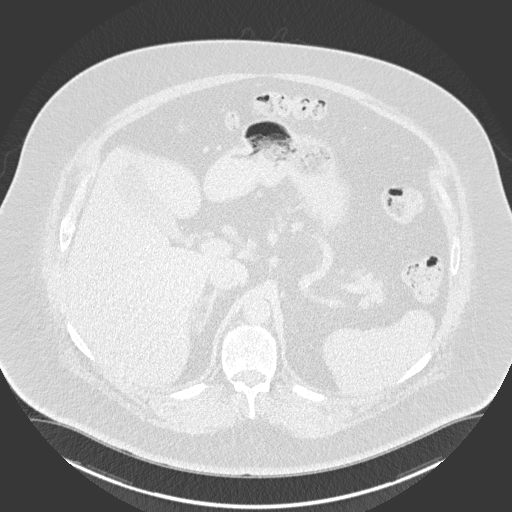
[im 23/143  lung]
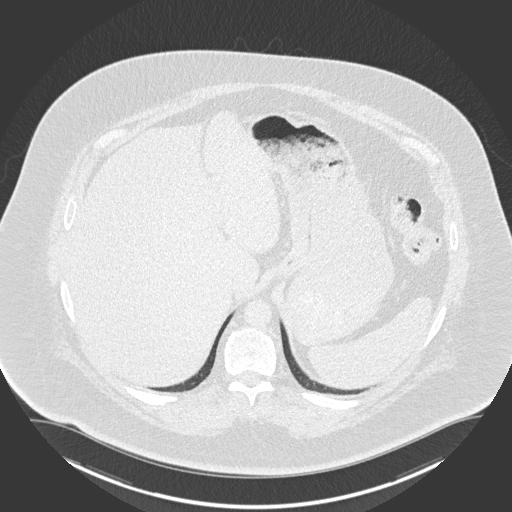
[im 38/143  lung]
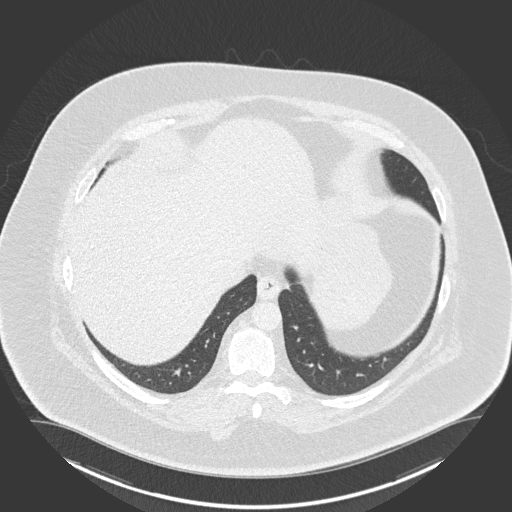
[im 45/143  lung]
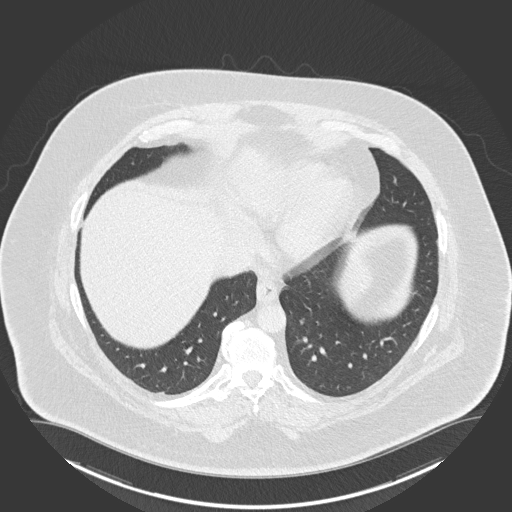
[im 60/143  mediastinal]
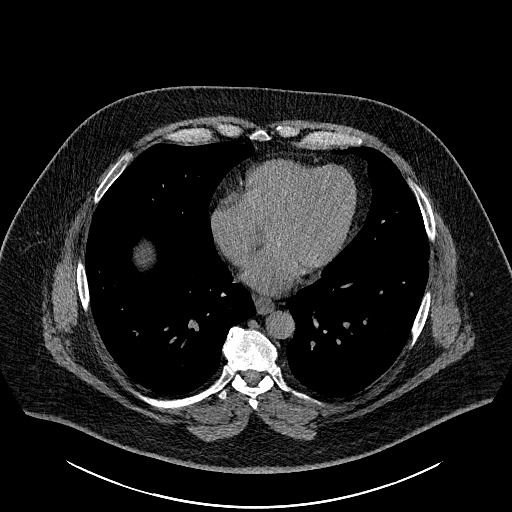
[im 60/143  lung]
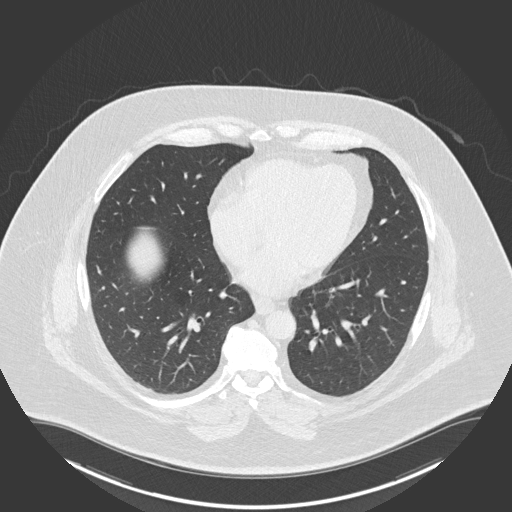
[im 75/143  lung]
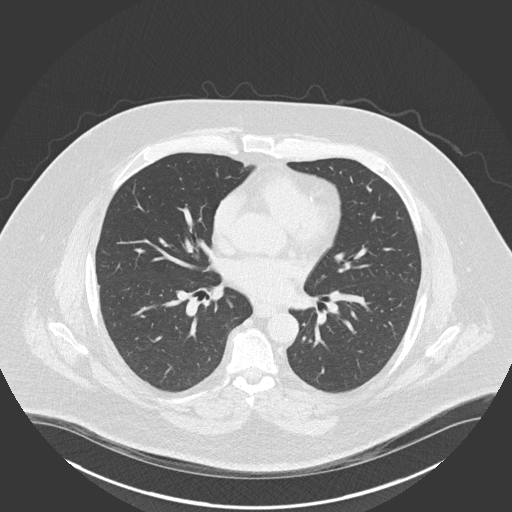
[im 83/143  lung]
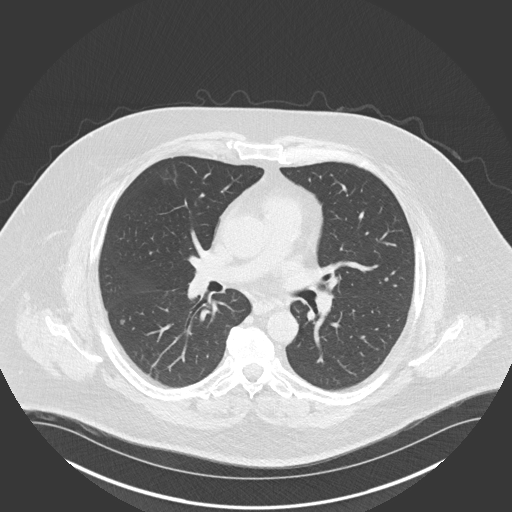
[im 98/143  lung]
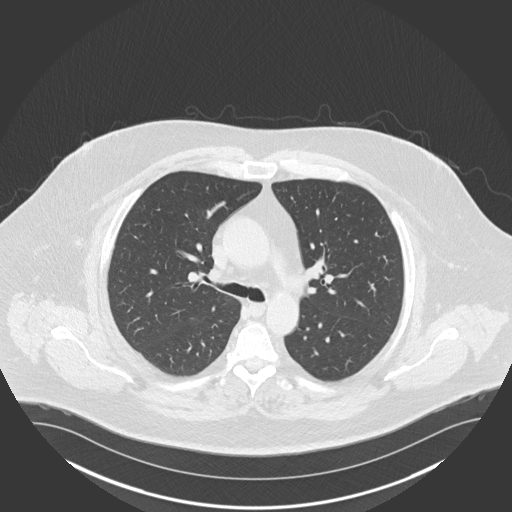
[im 105/143  mediastinal]
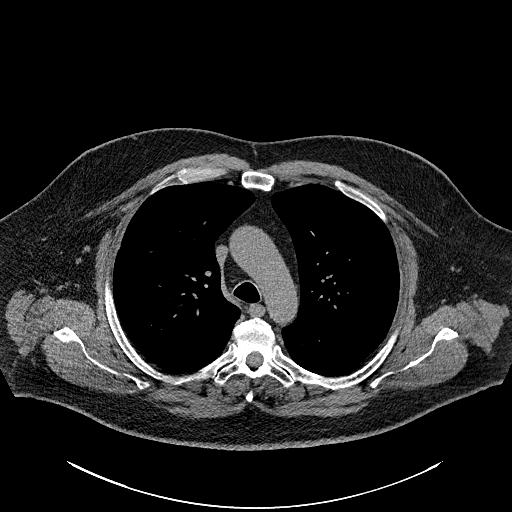
[im 105/143  lung]
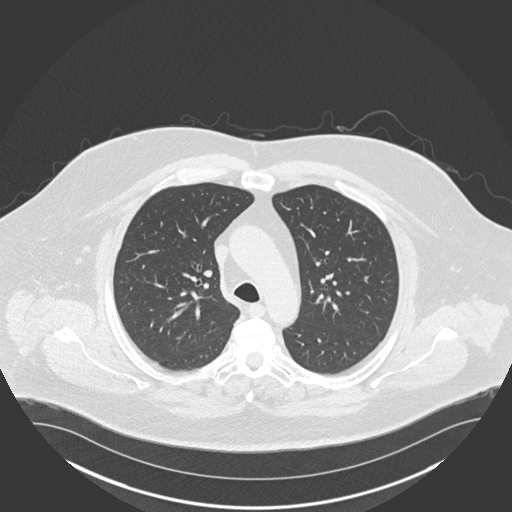
[im 120/143  lung]
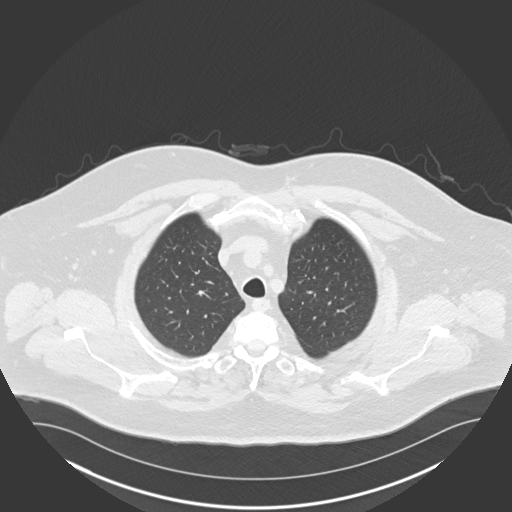
[im 135/143  lung]
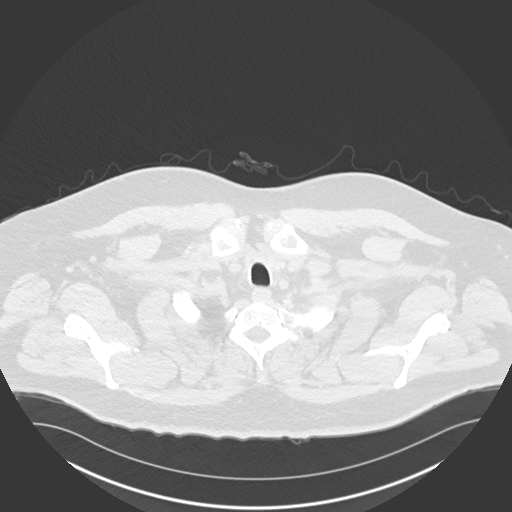

[Series 6: coronal · coronal · 0.58mm/px · 3 of 156 slices shown]
[im 32/156  lung]
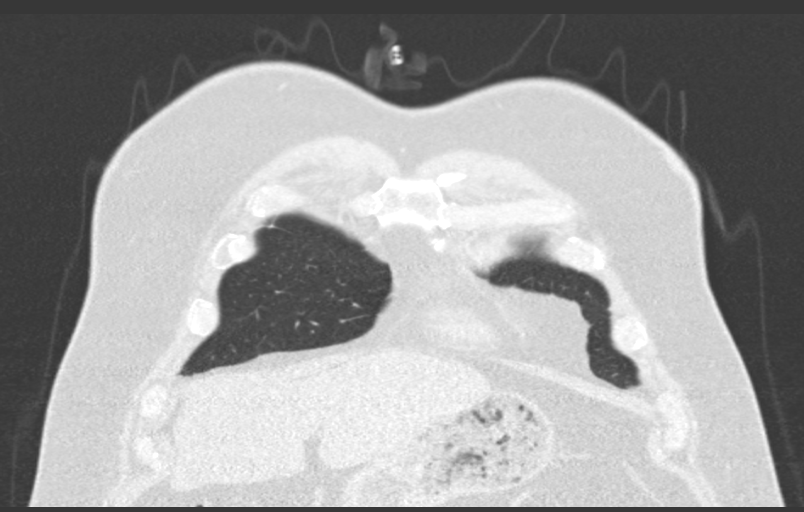
[im 63/156  lung]
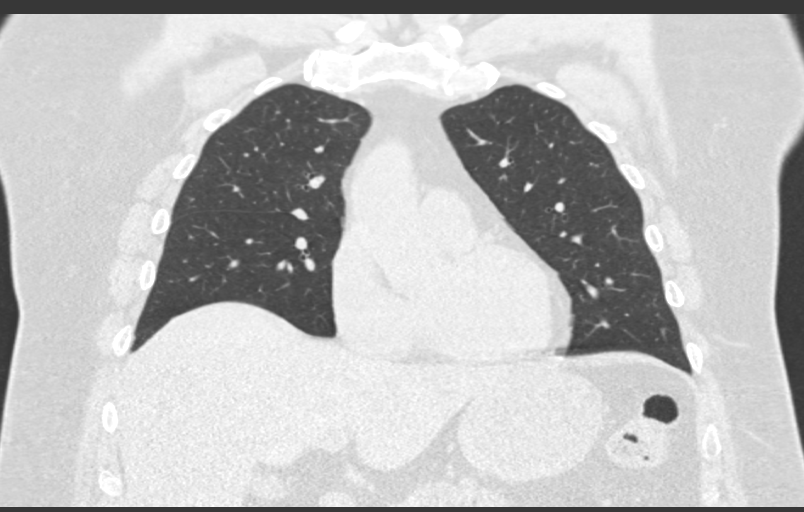
[im 94/156  lung]
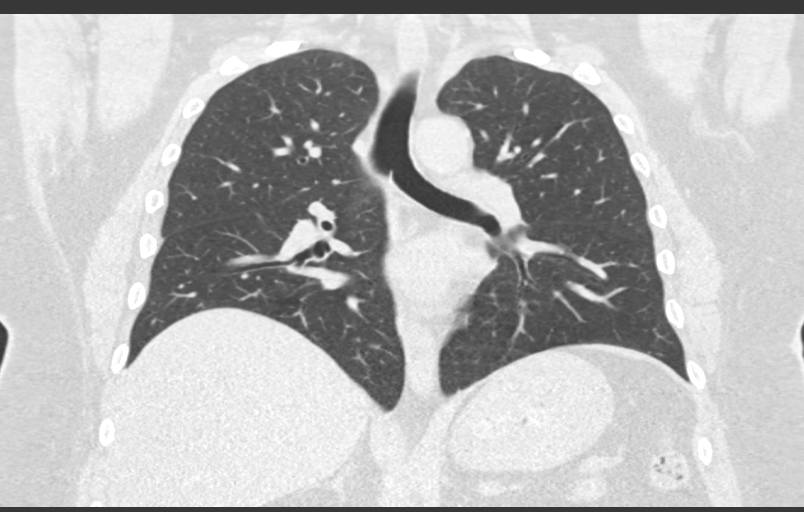

[14 of 36 positions shown; findings below may reference images not displayed]

FINDINGS: Cardiovascular: Normal heart size. No significant pericardial
fluid/thickening. Left anterior descending, left circumflex and
right coronary atherosclerosis. Great vessels are normal in course
and caliber.

Mediastinum/Nodes: No discrete thyroid nodules. Unremarkable
esophagus. No pathologically enlarged axillary, mediastinal or gross
hilar lymph nodes, noting limited sensitivity for the detection of
hilar adenopathy on this noncontrast study.

Lungs/Pleura: No pneumothorax. No pleural effusion. No acute
consolidative airspace disease or lung masses. Two solid scattered
pulmonary nodules in the right lung, largest 4 mm in the right lower
lobe (series 3/ image 61). No significant regions of subpleural
reticulation, ground-glass attenuation, traction bronchiectasis,
parenchymal banding, architectural distortion or frank honeycombing.
No significant air trapping on the expiration sequence.

Upper abdomen: Mild diffuse hepatic steatosis.

Musculoskeletal: No aggressive appearing focal osseous lesions. Mild
thoracic spondylosis.
IMPRESSION: 1. No evidence of interstitial lung disease .
2. Two small solid pulmonary nodules, largest 4 mm. No follow-up
needed if patient is low-risk (and has no known or suspected primary
neoplasm). Non-contrast chest CT can be considered in 12 months if
patient is high-risk. This recommendation follows the consensus
statement: Guidelines for Management of Incidental Pulmonary Nodules
Detected on CT Images: From the [HOSPITAL] [W2]; Radiology
[W2]; [DATE].
3. Three-vessel coronary atherosclerosis.
4. Diffuse hepatic steatosis.

## 2017-06-20 ENCOUNTER — Telehealth: Payer: Self-pay | Admitting: Pulmonary Disease

## 2017-06-20 DIAGNOSIS — R911 Solitary pulmonary nodule: Secondary | ICD-10-CM

## 2017-06-20 NOTE — Telephone Encounter (Signed)
Ok to cancel the appointment. He can follow up after repeat CT scan.  Marshell Garfinkel MD [facility] 06/20/2017, 4:58 PM

## 2017-06-20 NOTE — Telephone Encounter (Signed)
Pt returned phone call, pt contact # [phone]

## 2017-06-20 NOTE — Progress Notes (Signed)
LMTCB

## 2017-06-20 NOTE — Telephone Encounter (Signed)
Notes recorded by Marshell Garfinkel, MD on 06/20/2017 at 9:45 AM EDT Please let the patient know that the CT scan shows small lung nodules that we will follow-up with a repeat CT scan. Please order CT chest without contrast in 1 year  The CT also shows coronary atherosclerosis indicative of heart disease and fatty liver He will need to work on weight loss with diet and exercise, good control of his cholesterol. Ask him to follow-up with his primary care physician and cardiologist if he has one. Please forward results to his primary care physician.  ATC pt, no answer. Left message for pt to call back.

## 2017-06-20 NOTE — Telephone Encounter (Signed)
Spoke with pt, advised message from PM. He wants to know if he still needs to come to his appt on 07/22/17? He states he lost 36 pounds already and will work on diet.

## 2017-06-20 NOTE — Telephone Encounter (Signed)
Left a detailed message to call back if needed but advised appt was cancelled. Nothing further is needed.

## 2017-06-26 ENCOUNTER — Ambulatory Visit: Payer: Self-pay | Admitting: Endocrinology

## 2017-07-01 ENCOUNTER — Other Ambulatory Visit: Payer: Self-pay

## 2017-07-01 ENCOUNTER — Emergency Department (HOSPITAL_BASED_OUTPATIENT_CLINIC_OR_DEPARTMENT_OTHER)
Admission: EM | Admit: 2017-07-01 | Discharge: 2017-07-01 | Disposition: A | Discharge status: Home / Self Care | Payer: Medicaid Other | Attending: Emergency Medicine | Admitting: Emergency Medicine

## 2017-07-01 ENCOUNTER — Encounter (HOSPITAL_BASED_OUTPATIENT_CLINIC_OR_DEPARTMENT_OTHER): Payer: Self-pay | Admitting: Emergency Medicine

## 2017-07-01 DIAGNOSIS — Z79899 Other long term (current) drug therapy: Secondary | ICD-10-CM | POA: Insufficient documentation

## 2017-07-01 DIAGNOSIS — E119 Type 2 diabetes mellitus without complications: Secondary | ICD-10-CM | POA: Insufficient documentation

## 2017-07-01 DIAGNOSIS — Z7984 Long term (current) use of oral hypoglycemic drugs: Secondary | ICD-10-CM | POA: Insufficient documentation

## 2017-07-01 DIAGNOSIS — I2510 Atherosclerotic heart disease of native coronary artery without angina pectoris: Secondary | ICD-10-CM | POA: Insufficient documentation

## 2017-07-01 DIAGNOSIS — Z7902 Long term (current) use of antithrombotics/antiplatelets: Secondary | ICD-10-CM | POA: Insufficient documentation

## 2017-07-01 DIAGNOSIS — I10 Essential (primary) hypertension: Secondary | ICD-10-CM | POA: Insufficient documentation

## 2017-07-01 DIAGNOSIS — H5713 Ocular pain, bilateral: Secondary | ICD-10-CM | POA: Insufficient documentation

## 2017-07-01 DIAGNOSIS — H409 Unspecified glaucoma: Secondary | ICD-10-CM

## 2017-07-01 DIAGNOSIS — F17200 Nicotine dependence, unspecified, uncomplicated: Secondary | ICD-10-CM | POA: Insufficient documentation

## 2017-07-01 MED ORDER — FLUORESCEIN SODIUM 1 MG OP STRP
2.0000 | ORAL_STRIP | Freq: Once | OPHTHALMIC | Status: AC
  Administered 2017-07-01: 2 via OPHTHALMIC

## 2017-07-01 MED ORDER — TETRACAINE HCL 0.5 % OP SOLN
2.0000 [drp] | Freq: Once | OPHTHALMIC | Status: AC
  Administered 2017-07-01: 2 [drp] via OPHTHALMIC
  Filled 2017-07-01: qty 4

## 2017-07-01 MED ORDER — FLUORESCEIN SODIUM 0.6 MG OP STRP
ORAL_STRIP | OPHTHALMIC | Status: AC
  Filled 2017-07-01: qty 1

## 2017-07-01 NOTE — Discharge Instructions (Signed)
Please go see Dr. Posey Pronto immediately. [facility] [address] [place], [place] [address] [phone] (phone)

## 2017-07-01 NOTE — ED Notes (Signed)
ED Provider at bedside. 

## 2017-07-01 NOTE — ED Triage Notes (Signed)
Patient reports he began having an allergic reaction yesterday evening.  Reports eye drainage, burning.

## 2017-07-01 NOTE — ED Provider Notes (Signed)
[facility] EMERGENCY DEPARTMENT Provider Note   CSN: [id number] Arrival date & time: 07/01/17  1217     History   Chief Complaint Chief Complaint  Patient presents with  . Eye Drainage    HPI Tyler Brewer is a 56 y.o. male with a history of coronary artery disease, poorly controlled diabetes, hypertension, obesity, history of methamphetamine and narcotics addiction who presents today for evaluation of bilateral eye redness.  He reports that last night he started noting like his eyes feel itchy.  He does not feel like he has something in his eyes.  He denies any new exposures.  He wears glasses not contacts.    He reports that two days ago he visited a friend and is suspicious they put meth in his drink.   HPI  Past Medical History:  Diagnosis Date  . CAD (coronary artery disease)  cardiac cath with moderate disease in a septal branch of the ramus intermedius 04/01/2012  . Depression   . Diabetes mellitus    poorly controlled by his report  . History of narcotic addiction ([place])    past history of back pain  . Hypercholesteremia   . Hypertension   . IBS (irritable bowel syndrome)   . Obesity    Max weight was 390  . OSA on CPAP   . Panic attacks   . Testosterone deficiency   . Vertigo     Patient Active Problem List   Diagnosis Date Noted  . Interstitial lung disease ([place]) 05/02/2017  . Pansinusitis 09/26/2016  . Diabetic polyneuropathy associated with diabetes mellitus due to underlying condition ([place]) 05/08/2016  . Methamphetamine use disorder, severe, dependence ([place]) 02/11/2016  . Substance induced mood disorder ([place]) 02/11/2016  . Exertional dyspnea 11/30/2015  . ADD (attention deficit disorder) 09/29/2015  . Binge eating 09/29/2015  . Syncope 05/05/2012  . Diarrhea 05/05/2012  . Panic attacks 05/05/2012  . OSA (obstructive sleep apnea) 05/05/2012  . HTN (hypertension) 05/05/2012  . Diabetes mellitus, type II ([place]) 05/05/2012  . Dyslipidemia  05/05/2012  . CAD (coronary artery disease)  cardiac cath with moderate disease in a septal branch of the ramus intermedius 04/01/2012  . Chest pain 04/01/2012  . Drug abuse and dependence ([place]) 04/01/2012  . Family history of coronary artery disease 03/31/2012  . Sleep apnea, on C-pap 03/31/2012  . Hyperlipemia 03/30/2012  . HTN (hypertension), benign 03/30/2012  . Morbid obesity ([place]) 03/30/2012  . DM type 2, uncontrolled, with neuropathy ([place]) 03/30/2012  . Depression with anxiety 03/30/2012    Past Surgical History:  Procedure Laterality Date  . CARDIAC CATHETERIZATION         Home Medications    Prior to Admission medications   Medication Sig Start Date End Date Taking? Authorizing Provider  albuterol (PROVENTIL HFA;VENTOLIN HFA) 108 (90 Base) MCG/ACT inhaler Inhale 2 puffs into the lungs every 6 (six) hours as needed for wheezing or shortness of breath. 06/05/17   Mannam, Hart Robinsons, MD  amLODipine (NORVASC) 10 MG tablet Take 1 tablet (10 mg total) by mouth daily. 05/02/17   [name], DO  Insulin Lispro Prot & Lispro (HUMALOG MIX 75/25 KWIKPEN) (75-25) 100 UNIT/ML Kwikpen Inject 10 Units into the skin 2 (two) times daily. Patient taking differently: Inject 35 Units into the skin 2 (two) times daily.  05/02/17   [name], DO  metFORMIN (GLUCOPHAGE) 1000 MG tablet Take 1 tablet (1,000 mg total) by mouth 2 (two) times daily with a meal. 05/02/17  Carollee Herter, Yvonne R, DO  pravastatin (PRAVACHOL) 40 MG tablet Take 1 tablet (40 mg total) by mouth daily. 05/06/17   [name], DO  traMADol (ULTRAM) 50 MG tablet Take 1 tablet (50 mg total) by mouth every 8 (eight) hours as needed. 05/02/17   [name], DO    Family History Family History  Problem Relation Age of Onset  . Diabetes type II Father   . Hypertension Father   . Pancreatic disease Father 53       Deceased  . Healthy Mother   . Healthy Sister   . Healthy Son   . Healthy  Daughter     Social History Social History   Tobacco Use  . Smoking status: Light Tobacco Smoker    Last attempt to quit: 11/21/2015    Years since quitting: 1.6  . Smokeless tobacco: Never Used  Substance Use Topics  . Alcohol use: No    Alcohol/week: 0.0 oz    Comment: Rare  . Drug use: Yes    Types: Methamphetamines    Comment: last use in June 2017     Allergies   Patient has no known allergies.   Review of Systems Review of Systems  Constitutional: Negative for chills and fever.  HENT: Negative for ear pain and sore throat.   Eyes: Positive for photophobia, pain, discharge, redness and visual disturbance.  Respiratory: Negative for cough and shortness of breath.   Cardiovascular: Negative for chest pain and palpitations.  Gastrointestinal: Negative for abdominal pain and vomiting.  Genitourinary: Negative for dysuria and hematuria.  Musculoskeletal: Negative for arthralgias and back pain.  Skin: Negative for color change and rash.       Redness around bilateral eyes  Neurological: Negative for seizures, syncope and headaches.  All other systems reviewed and are negative.    Physical Exam Updated Vital Signs BP (!) 161/87 (BP Location: Left Arm)   Pulse 82   Temp 98.1 F (36.7 C) (Oral)   Resp 18   Ht 5\' 11"  (1.803 m)   Wt (!) 145.2 kg (320 lb)   SpO2 99%   BMI 44.63 kg/m   Physical Exam  Constitutional: He appears well-developed and well-nourished. No distress.  HENT:  Head: Normocephalic and atraumatic.  Erythema around bilateral eyes.  Please see clinical picture.   Eyes: EOM are normal. Pupils are equal, round, and reactive to light. Right eye exhibits no discharge. Left eye exhibits no discharge. Right conjunctiva is injected. Left conjunctiva is injected. No scleral icterus.  Slit lamp exam:      The right eye shows fluorescein uptake. The right eye shows no corneal ulcer.       The left eye shows no corneal ulcer and no fluorescein uptake.     Bilateral eyes were numbed and stained.  Pressures were obtained bilaterally using tonopen.  Left eye 40, right eye 35.  These results were repeated multiple times and were consistent.   Neck: Normal range of motion.  Cardiovascular: Normal rate and regular rhythm.  Pulmonary/Chest: Effort normal. No stridor. No respiratory distress.  Abdominal: He exhibits no distension.  Musculoskeletal: He exhibits no edema or deformity.  Neurological: He is alert. He exhibits normal muscle tone.  Skin: Skin is warm and dry. He is not diaphoretic.  Psychiatric: He has a normal mood and affect. His behavior is normal.  Nursing note and vitals reviewed.  Pictures taken after eyes were stained.  ED Treatments / Results  Labs (all labs ordered are listed, but only abnormal results are displayed) Labs Reviewed - No data to display  EKG  EKG Interpretation None       Radiology No results found.  Procedures Procedures (including critical care time)  Medications Ordered in ED Medications  fluorescein ophthalmic strip 2 strip (2 strips Both Eyes Given by Other 07/01/17 1627)  tetracaine (PONTOCAINE) 0.5 % ophthalmic solution 2 drop (2 drops Both Eyes Given by Other 07/01/17 1627)     Initial Impression / Assessment and Plan / ED Course  I have reviewed the triage vital signs and the nursing notes.  Pertinent labs & imaging results that were available during my care of the patient were reviewed by me and considered in my medical decision making (see chart for details).  Clinical Course as of Jul 01 1952  [facility] Jul 01, 2017  1618 Left eye 40, right eye 35.   [EH]  [address] Patient's Dr. Is Dr. Posey Pronto at [facility].    [EH]    Clinical Course User Index [EH] Lorin Glass, PA-C   Thomasenia Bottoms presents today for evaluation of bilateral eye pain blurry vision and surrounding redness,  that occurred yesterday afternoon while he was driving.  Patient was found to  have elevated ocular pressures bilaterally.  Questionable chlorazene uptake in the right eye.  Patient has been seen by Dr. Posey Pronto, ophthalmologist in the past.  Dr. Posey Pronto was contacted and she agreed to see patient in her office immediately.  Patient was discharged with instructions to go directly to Dr. Serita Grit office.  Patient was instructed not to drive himself.  Patient is hemodynamically stable, d/c for immediate follow-up with his ophthalmologist.  Final Clinical Impressions(s) / ED Diagnoses   Final diagnoses:  Eye pain, bilateral  Glaucoma of both eyes, unspecified glaucoma type    ED Discharge Orders    None       Ollen Gross 07/01/17 1956    Tegeler, Gwenyth Allegra, MD 07/01/17 2019

## 2017-07-02 ENCOUNTER — Telehealth: Payer: Self-pay | Admitting: Pulmonary Disease

## 2017-07-02 NOTE — Telephone Encounter (Signed)
Notes recorded by Marshell Garfinkel, MD on 06/20/2017 at 9:45 AM EDT Please let the patient know that the CT scan shows small lung nodules that we will follow-up with a repeat CT scan. Please order CT chest without contrast in 1 year  The CT also shows coronary atherosclerosis indicative of heart disease and fatty liver He will need to work on weight loss with diet and exercise, good control of his cholesterol. Ask him to follow-up with his primary care physician and cardiologist if he has one. Please forward results to his primary care physician. --------------------------------------------------------------------- Spoke with pt. He is aware of results. Nothing further was needed.

## 2017-07-22 ENCOUNTER — Ambulatory Visit: Payer: Medicaid Other | Admitting: Pulmonary Disease

## 2017-08-05 ENCOUNTER — Other Ambulatory Visit: Payer: Self-pay

## 2017-08-21 ENCOUNTER — Emergency Department (HOSPITAL_BASED_OUTPATIENT_CLINIC_OR_DEPARTMENT_OTHER): Payer: BLUE CROSS/BLUE SHIELD

## 2017-08-21 ENCOUNTER — Other Ambulatory Visit: Payer: Self-pay

## 2017-08-21 ENCOUNTER — Encounter (HOSPITAL_BASED_OUTPATIENT_CLINIC_OR_DEPARTMENT_OTHER): Payer: Self-pay

## 2017-08-21 ENCOUNTER — Emergency Department (HOSPITAL_BASED_OUTPATIENT_CLINIC_OR_DEPARTMENT_OTHER)
Admission: EM | Admit: 2017-08-21 | Discharge: 2017-08-21 | Disposition: A | Discharge status: Home / Self Care | Payer: BLUE CROSS/BLUE SHIELD | Attending: Emergency Medicine | Admitting: Emergency Medicine

## 2017-08-21 DIAGNOSIS — Z79899 Other long term (current) drug therapy: Secondary | ICD-10-CM | POA: Diagnosis not present

## 2017-08-21 DIAGNOSIS — I10 Essential (primary) hypertension: Secondary | ICD-10-CM | POA: Diagnosis not present

## 2017-08-21 DIAGNOSIS — Z794 Long term (current) use of insulin: Secondary | ICD-10-CM | POA: Insufficient documentation

## 2017-08-21 DIAGNOSIS — R05 Cough: Secondary | ICD-10-CM | POA: Diagnosis present

## 2017-08-21 DIAGNOSIS — J069 Acute upper respiratory infection, unspecified: Secondary | ICD-10-CM | POA: Insufficient documentation

## 2017-08-21 DIAGNOSIS — F17200 Nicotine dependence, unspecified, uncomplicated: Secondary | ICD-10-CM | POA: Insufficient documentation

## 2017-08-21 DIAGNOSIS — Z7902 Long term (current) use of antithrombotics/antiplatelets: Secondary | ICD-10-CM | POA: Diagnosis not present

## 2017-08-21 DIAGNOSIS — E1140 Type 2 diabetes mellitus with diabetic neuropathy, unspecified: Secondary | ICD-10-CM | POA: Diagnosis not present

## 2017-08-21 DIAGNOSIS — I2510 Atherosclerotic heart disease of native coronary artery without angina pectoris: Secondary | ICD-10-CM | POA: Diagnosis not present

## 2017-08-21 IMAGING — CR DG CHEST 2V
2 series · 2 of 2 positions shown · non-contrast
Comparison: Chest radiographs [DATE] and CT [DATE]

CLINICAL DATA: Productive cough, shortness of breath, congestion,
and fever for 1 week.

EXAM:
CHEST  2 VIEW

[w chest pa]
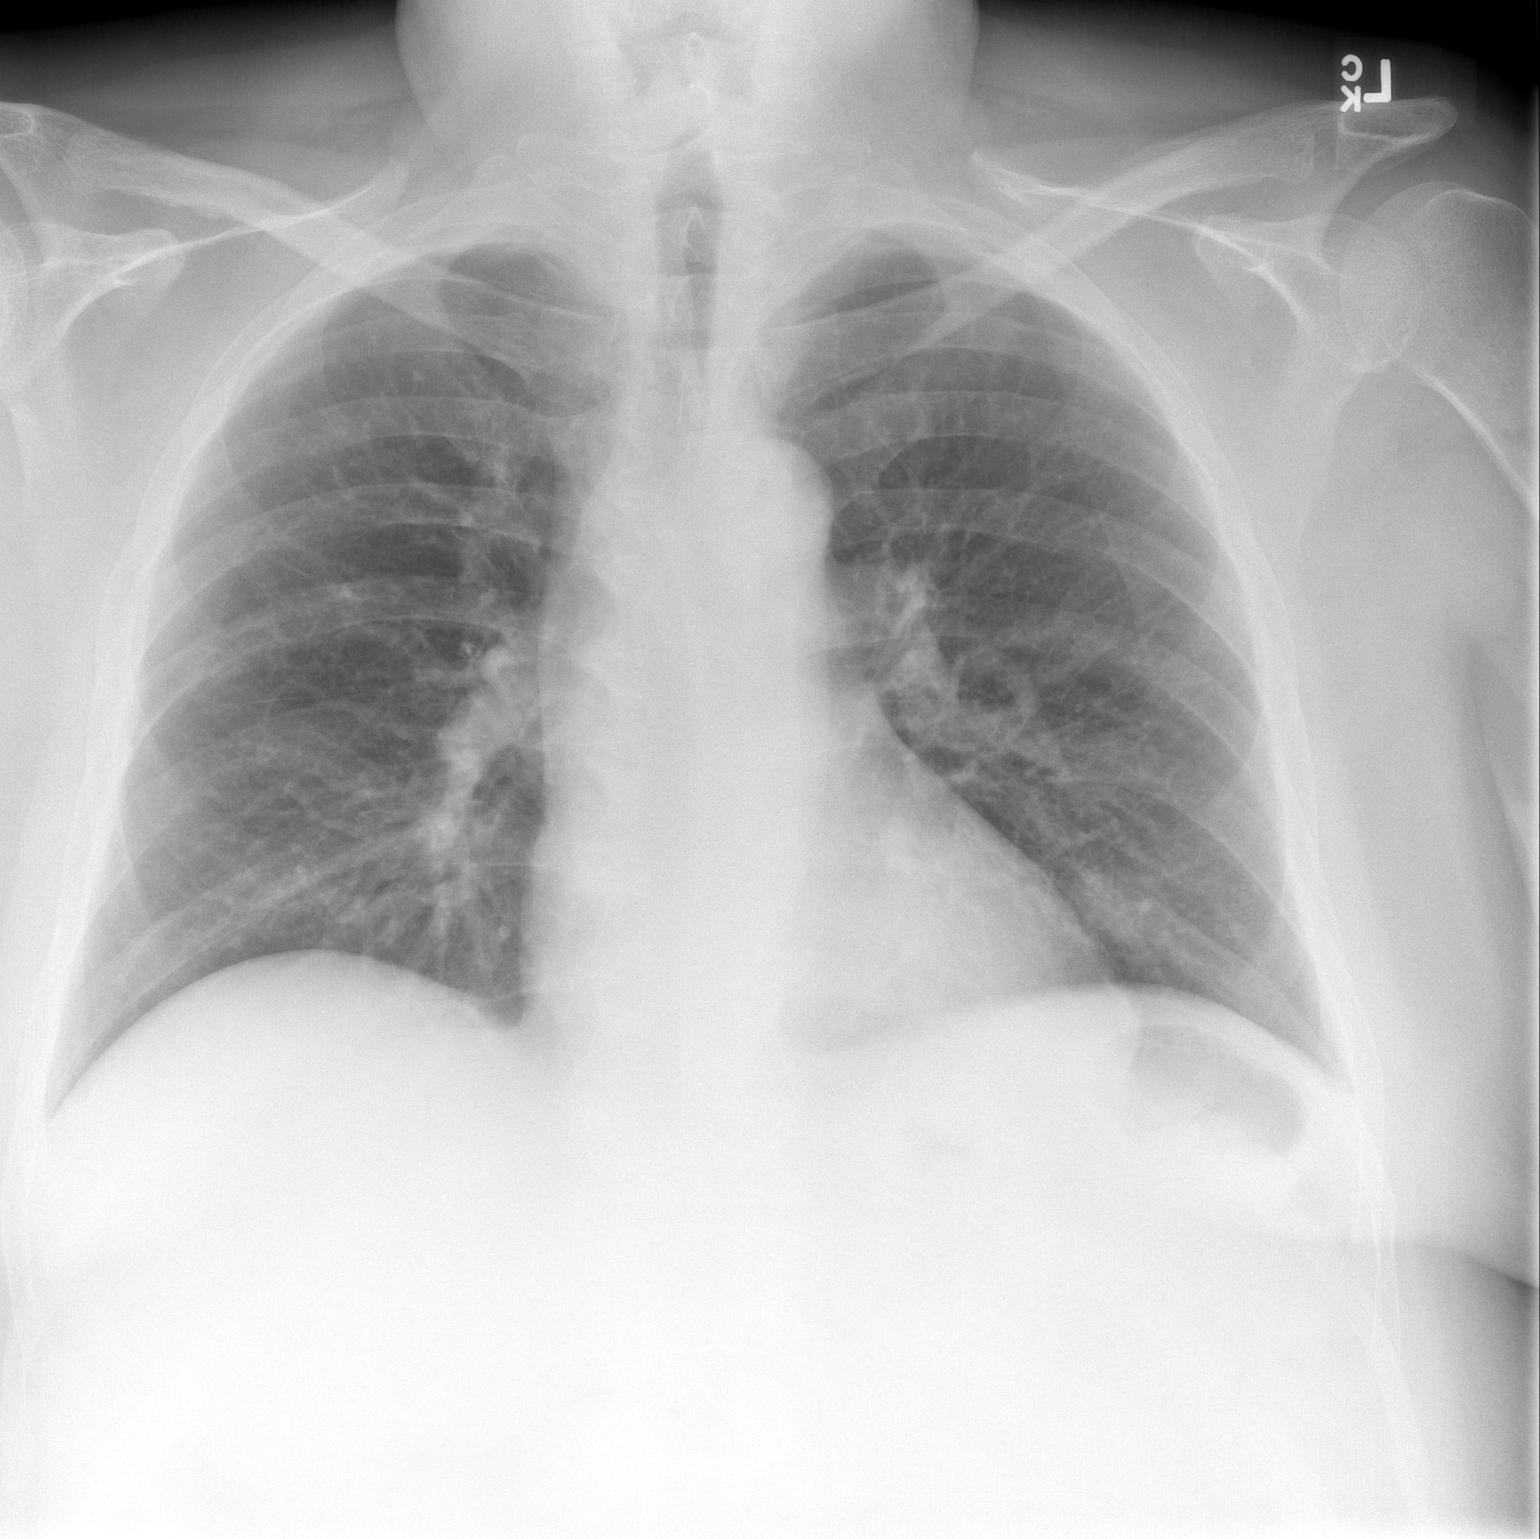

[w chest lat]
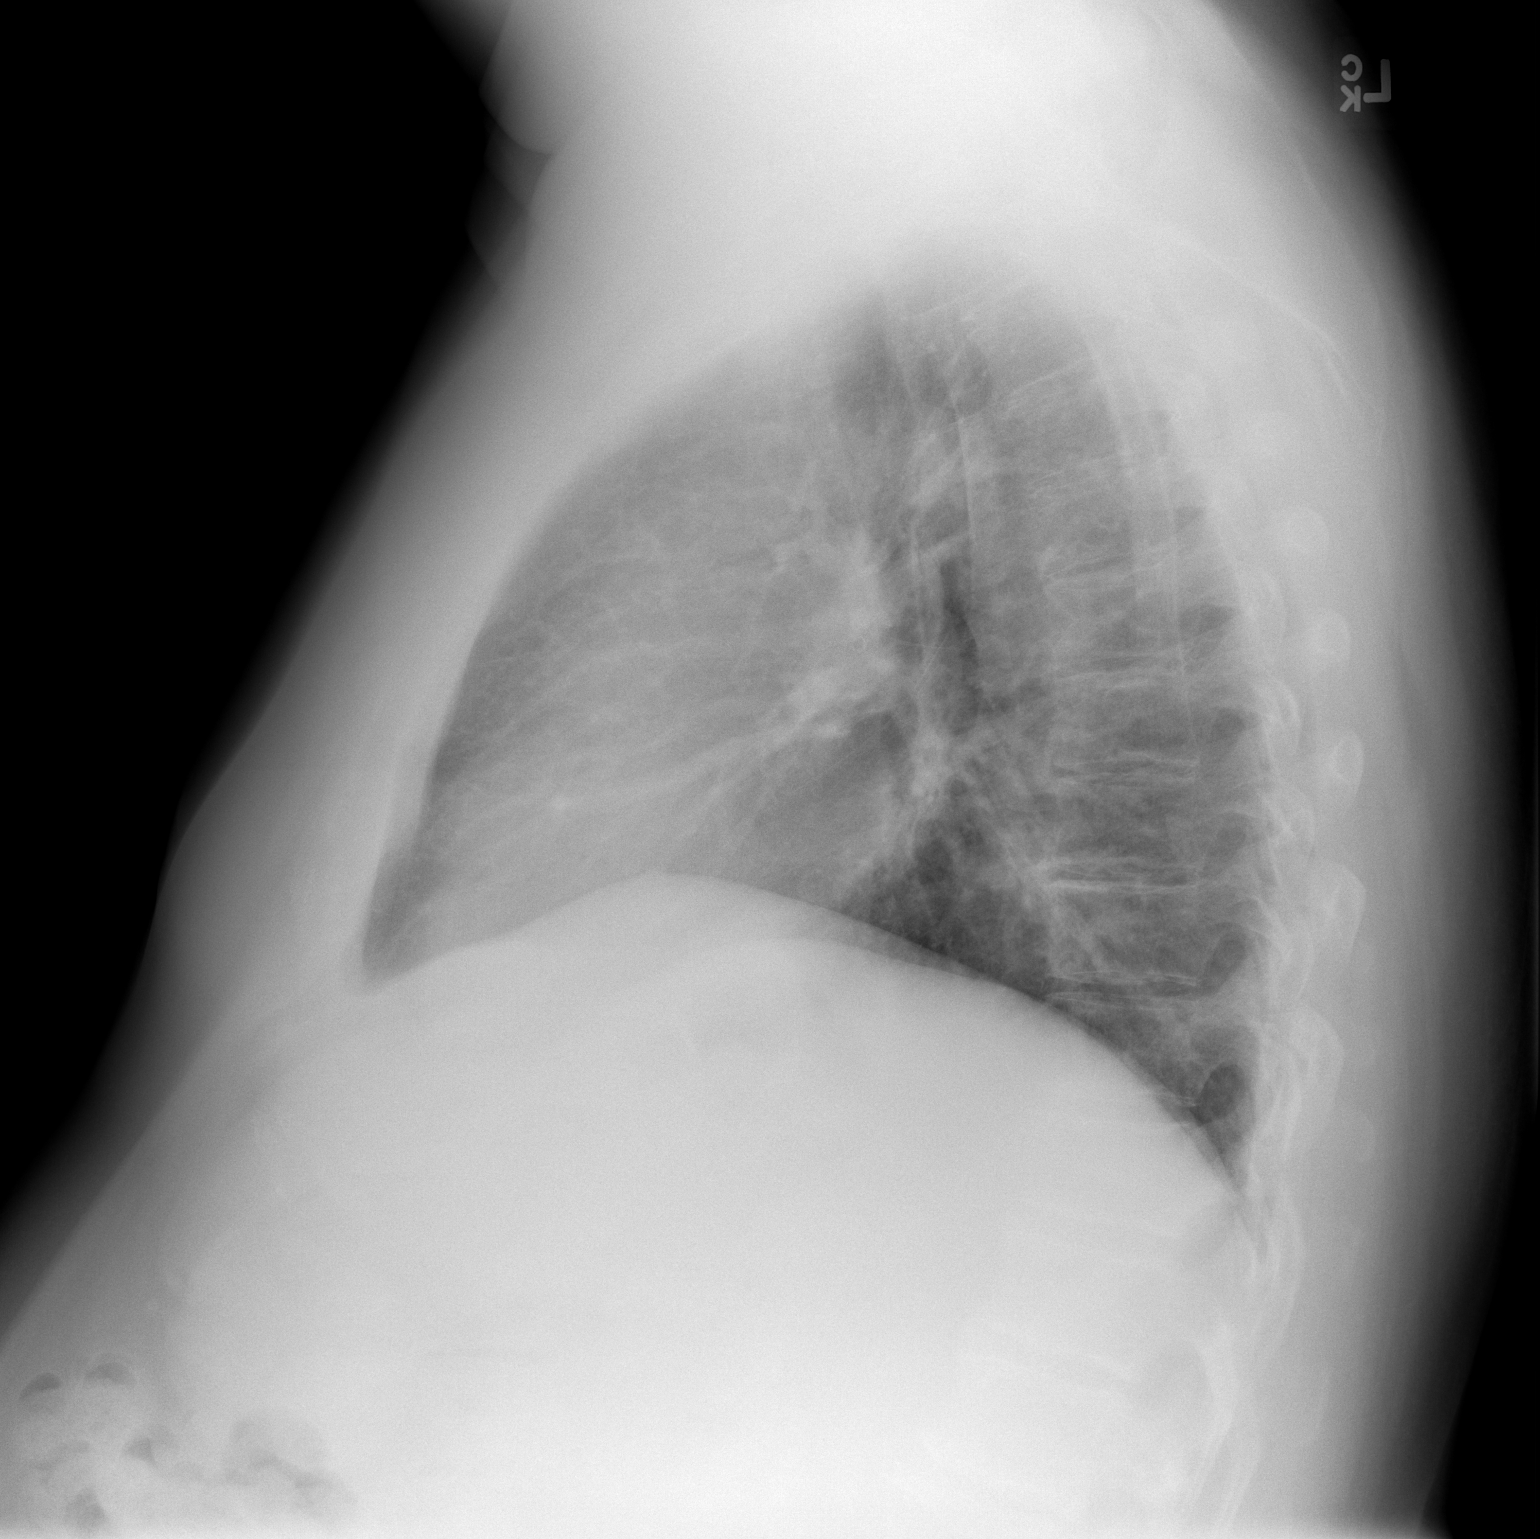

[2 of 2 positions shown; findings below may reference images not displayed]

FINDINGS: The cardiomediastinal silhouette is unchanged and within normal
limits. The lungs are mildly hypoinflated. No airspace
consolidation, edema, pleural effusion, pneumothorax is identified.
Thoracic spondylosis is noted.
IMPRESSION: No active cardiopulmonary disease.

## 2017-08-21 MED ORDER — BENZONATATE 100 MG PO CAPS: 100.0000 mg | ORAL_CAPSULE | Freq: Three times a day (TID) | ORAL | 0 refills | Status: DC

## 2017-08-21 MED ORDER — GUAIFENESIN 200 MG PO TABS: 200.0000 mg | ORAL_TABLET | ORAL | 0 refills | Status: DC | PRN

## 2017-08-21 NOTE — ED Provider Notes (Signed)
[facility] EMERGENCY DEPARTMENT Provider Note   CSN: [id number] Arrival date & time: 08/21/17  1205     History   Chief Complaint Chief Complaint  Patient presents with  . Cough    HPI Tyler Brewer is a 57 y.o. male.  HPI   57 year old male with history of CAD, obesity, OSA on CPAP, diabetes, opiate abuse presenting for evaluation of cough.  For the past week patient has had sinus congestion, occasional sneezing, runny nose, cough productive with whitish yellow sputum, throat irritation.  Symptoms not adequately relief despite using over-the-counter medication.  No associated fever, body or body aches.  No vomiting or diarrhea but does admits to some posttussive emesis.  He did not have a flu shot.  Denies any recent sick contact.  He is an occasional smoker.  Past Medical History:  Diagnosis Date  . CAD (coronary artery disease)  cardiac cath with moderate disease in a septal branch of the ramus intermedius 04/01/2012  . Depression   . Diabetes mellitus    poorly controlled by his report  . History of narcotic addiction ([place])    past history of back pain  . Hypercholesteremia   . Hypertension   . IBS (irritable bowel syndrome)   . Obesity    Max weight was 390  . OSA on CPAP   . Panic attacks   . Testosterone deficiency   . Vertigo     Patient Active Problem List   Diagnosis Date Noted  . Interstitial lung disease ([place]) 05/02/2017  . Pansinusitis 09/26/2016  . Diabetic polyneuropathy associated with diabetes mellitus due to underlying condition ([place]) 05/08/2016  . Methamphetamine use disorder, severe, dependence ([place]) 02/11/2016  . Substance induced mood disorder ([place]) 02/11/2016  . Exertional dyspnea 11/30/2015  . ADD (attention deficit disorder) 09/29/2015  . Binge eating 09/29/2015  . Syncope 05/05/2012  . Diarrhea 05/05/2012  . Panic attacks 05/05/2012  . OSA (obstructive sleep apnea) 05/05/2012  . HTN (hypertension) 05/05/2012  . Diabetes  mellitus, type II ([place]) 05/05/2012  . Dyslipidemia 05/05/2012  . CAD (coronary artery disease)  cardiac cath with moderate disease in a septal branch of the ramus intermedius 04/01/2012  . Chest pain 04/01/2012  . Drug abuse and dependence ([place]) 04/01/2012  . Family history of coronary artery disease 03/31/2012  . Sleep apnea, on C-pap 03/31/2012  . Hyperlipemia 03/30/2012  . HTN (hypertension), benign 03/30/2012  . Morbid obesity ([place]) 03/30/2012  . DM type 2, uncontrolled, with neuropathy ([place]) 03/30/2012  . Depression with anxiety 03/30/2012    Past Surgical History:  Procedure Laterality Date  . CARDIAC CATHETERIZATION    . LEFT HEART CATHETERIZATION WITH CORONARY ANGIOGRAM N/A 03/31/2012   Procedure: LEFT HEART CATHETERIZATION WITH CORONARY ANGIOGRAM;  Surgeon: Leonie Man, MD;  Location: [facility] CATH LAB;  Service: Cardiovascular;  Laterality: N/A;       Home Medications    Prior to Admission medications   Medication Sig Start Date End Date Taking? Authorizing Provider  albuterol (PROVENTIL HFA;VENTOLIN HFA) 108 (90 Base) MCG/ACT inhaler Inhale 2 puffs into the lungs every 6 (six) hours as needed for wheezing or shortness of breath. 06/05/17   Mannam, Hart Robinsons, MD  amLODipine (NORVASC) 10 MG tablet Take 1 tablet (10 mg total) by mouth daily. 05/02/17   [name], DO  Insulin Lispro Prot & Lispro (HUMALOG MIX 75/25 KWIKPEN) (75-25) 100 UNIT/ML Kwikpen Inject 10 Units into the skin 2 (two) times daily. Patient taking differently: Inject 35 Units  into the skin 2 (two) times daily.  05/02/17   [name], DO  metFORMIN (GLUCOPHAGE) 1000 MG tablet Take 1 tablet (1,000 mg total) by mouth 2 (two) times daily with a meal. 05/02/17   Carollee Herter, Alferd Apa, DO  pravastatin (PRAVACHOL) 40 MG tablet Take 1 tablet (40 mg total) by mouth daily. 05/06/17   [name], DO  traMADol (ULTRAM) 50 MG tablet Take 1 tablet (50 mg total) by mouth every 8 (eight) hours as  needed. 05/02/17   [name], DO    Family History Family History  Problem Relation Age of Onset  . Diabetes type II Father   . Hypertension Father   . Pancreatic disease Father 62       Deceased  . Healthy Mother   . Healthy Sister   . Healthy Son   . Healthy Daughter     Social History Social History   Tobacco Use  . Smoking status: Light Tobacco Smoker  . Smokeless tobacco: Never Used  Substance Use Topics  . Alcohol use: No    Alcohol/week: 0.0 oz  . Drug use: No     Allergies   Patient has no known allergies.   Review of Systems Review of Systems  Constitutional: Negative for fever.  All other systems reviewed and are negative.    Physical Exam Updated Vital Signs BP (!) 148/106 (BP Location: Right Arm)   Pulse 79   Temp 99 F (37.2 C) (Oral)   Resp 20   Ht 5\' 11"  (1.803 m)   Wt 136.1 kg (300 lb)   SpO2 100%   BMI 41.84 kg/m   Physical Exam  Constitutional: He appears well-developed and well-nourished. No distress.  Obese male sitting the chair in no acute discomfort, nontoxic in appearance  HENT:  Head: Atraumatic.  Right Ear: External ear normal.  Left Ear: External ear normal.  Nose: Nose normal.  Mouth/Throat: Oropharynx is clear and moist.  Eyes: Conjunctivae are normal.  Neck: Normal range of motion. Neck supple.  No nuchal rigidity  Cardiovascular: Normal rate and regular rhythm.  Pulmonary/Chest: Effort normal and breath sounds normal. No respiratory distress. He has no wheezes. He has no rales.  Lymphadenopathy:    He has no cervical adenopathy.  Neurological: He is alert.  Skin: No rash noted.  Psychiatric: He has a normal mood and affect.  Nursing note and vitals reviewed.    ED Treatments / Results  Labs (all labs ordered are listed, but only abnormal results are displayed) Labs Reviewed - No data to display  EKG  EKG Interpretation None       Radiology Dg Chest 2 View  Result Date:  08/21/2017 CLINICAL DATA:  Productive cough, shortness of breath, congestion, and fever for 1 week. EXAM: CHEST  2 VIEW COMPARISON:  Chest radiographs 04/25/2017 and CT 06/18/2017 FINDINGS: The cardiomediastinal silhouette is unchanged and within normal limits. The lungs are mildly hypoinflated. No airspace consolidation, edema, pleural effusion, pneumothorax is identified. Thoracic spondylosis is noted. IMPRESSION: No active cardiopulmonary disease. Electronically Signed   By: Logan Bores M.D.   On: 08/21/2017 14:09    Procedures Procedures (including critical care time)  Medications Ordered in ED Medications - No data to display   Initial Impression / Assessment and Plan / ED Course  I have reviewed the triage vital signs and the nursing notes.  Pertinent labs & imaging results that were available during my care of the patient were  reviewed by me and considered in my medical decision making (see chart for details).     BP (!) 148/106 (BP Location: Right Arm)   Pulse 79   Temp 99 F (37.2 C) (Oral)   Resp 20   Ht 5\' 11"  (1.803 m)   Wt 136.1 kg (300 lb)   SpO2 100%   BMI 41.84 kg/m    Final Clinical Impressions(s) / ED Diagnoses   Final diagnoses:  Acute upper respiratory infection    ED Discharge Orders        Ordered    benzonatate (TESSALON) 100 MG capsule  Every 8 hours     08/21/17 1444    guaiFENesin 200 MG tablet  Every 4 hours PRN     08/21/17 1444     Pt symptoms consistent with URI. CXR negative for acute infiltrate. Pt will be discharged with symptomatic treatment.  Discussed return precautions.  Pt is hemodynamically stable & in NAD prior to discharge.    Domenic Moras, PA-C 08/21/17 1450    Virgel Manifold, MD 08/21/17 [phone]

## 2017-08-21 NOTE — ED Triage Notes (Signed)
C/o flu like sx x 1 week-NAD-steady gait 

## 2018-05-03 ENCOUNTER — Emergency Department (HOSPITAL_BASED_OUTPATIENT_CLINIC_OR_DEPARTMENT_OTHER): Payer: BLUE CROSS/BLUE SHIELD

## 2018-05-03 ENCOUNTER — Emergency Department (HOSPITAL_BASED_OUTPATIENT_CLINIC_OR_DEPARTMENT_OTHER)
Admission: EM | Admit: 2018-05-03 | Discharge: 2018-05-03 | Disposition: A | Discharge status: Home / Self Care | Payer: BLUE CROSS/BLUE SHIELD | Attending: Emergency Medicine | Admitting: Emergency Medicine

## 2018-05-03 ENCOUNTER — Encounter (HOSPITAL_BASED_OUTPATIENT_CLINIC_OR_DEPARTMENT_OTHER): Payer: Self-pay | Admitting: *Deleted

## 2018-05-03 ENCOUNTER — Other Ambulatory Visit: Payer: Self-pay

## 2018-05-03 DIAGNOSIS — F1510 Other stimulant abuse, uncomplicated: Secondary | ICD-10-CM | POA: Diagnosis not present

## 2018-05-03 DIAGNOSIS — F17210 Nicotine dependence, cigarettes, uncomplicated: Secondary | ICD-10-CM | POA: Diagnosis not present

## 2018-05-03 DIAGNOSIS — R739 Hyperglycemia, unspecified: Secondary | ICD-10-CM | POA: Diagnosis not present

## 2018-05-03 DIAGNOSIS — I2510 Atherosclerotic heart disease of native coronary artery without angina pectoris: Secondary | ICD-10-CM | POA: Insufficient documentation

## 2018-05-03 DIAGNOSIS — Y929 Unspecified place or not applicable: Secondary | ICD-10-CM | POA: Diagnosis not present

## 2018-05-03 DIAGNOSIS — Y9389 Activity, other specified: Secondary | ICD-10-CM | POA: Diagnosis not present

## 2018-05-03 DIAGNOSIS — S6992XA Unspecified injury of left wrist, hand and finger(s), initial encounter: Secondary | ICD-10-CM | POA: Diagnosis present

## 2018-05-03 DIAGNOSIS — R51 Headache: Secondary | ICD-10-CM | POA: Diagnosis not present

## 2018-05-03 DIAGNOSIS — Y998 Other external cause status: Secondary | ICD-10-CM | POA: Insufficient documentation

## 2018-05-03 DIAGNOSIS — R42 Dizziness and giddiness: Secondary | ICD-10-CM | POA: Insufficient documentation

## 2018-05-03 DIAGNOSIS — R5381 Other malaise: Secondary | ICD-10-CM | POA: Diagnosis not present

## 2018-05-03 DIAGNOSIS — E1140 Type 2 diabetes mellitus with diabetic neuropathy, unspecified: Secondary | ICD-10-CM | POA: Insufficient documentation

## 2018-05-03 DIAGNOSIS — S62639A Displaced fracture of distal phalanx of unspecified finger, initial encounter for closed fracture: Secondary | ICD-10-CM | POA: Diagnosis not present

## 2018-05-03 DIAGNOSIS — I10 Essential (primary) hypertension: Secondary | ICD-10-CM | POA: Insufficient documentation

## 2018-05-03 LAB — CBC WITH DIFFERENTIAL/PLATELET
Basophils Absolute: 0 10*3/uL (ref 0.0–0.1)
Basophils Relative: 0 %
Eosinophils Absolute: 0.4 10*3/uL (ref 0.0–0.7)
Eosinophils Relative: 4 %
HCT: 48 % (ref 39.0–52.0)
Hemoglobin: 16.7 g/dL (ref 13.0–17.0)
Lymphocytes Relative: 18 %
Lymphs Abs: 1.7 10*3/uL (ref 0.7–4.0)
MCH: 29.8 pg (ref 26.0–34.0)
MCHC: 34.8 g/dL (ref 30.0–36.0)
MCV: 85.7 fL (ref 78.0–100.0)
Monocytes Absolute: 0.7 10*3/uL (ref 0.1–1.0)
Monocytes Relative: 7 %
Neutro Abs: 6.8 10*3/uL (ref 1.7–7.7)
Neutrophils Relative %: 71 %
Platelets: 251 10*3/uL (ref 150–400)
RBC: 5.6 MIL/uL (ref 4.22–5.81)
RDW: 12.9 % (ref 11.5–15.5)
WBC: 9.7 10*3/uL (ref 4.0–10.5)

## 2018-05-03 LAB — BASIC METABOLIC PANEL
Anion gap: 10 (ref 5–15)
BUN: 9 mg/dL (ref 6–20)
CO2: 26 mmol/L (ref 22–32)
Calcium: 8.8 mg/dL — ABNORMAL LOW (ref 8.9–10.3)
Chloride: 97 mmol/L — ABNORMAL LOW (ref 98–111)
Creatinine, Ser: 0.98 mg/dL (ref 0.61–1.24)
GFR calc Af Amer: >60 mL/min (ref >60)
GFR calc non Af Amer: >60 mL/min (ref >60)
Glucose, Bld: 504 mg/dL (ref 70–99)
Potassium: 4.1 mmol/L (ref 3.5–5.1)
Sodium: 133 mmol/L — ABNORMAL LOW (ref 135–145)

## 2018-05-03 LAB — CBG MONITORING, ED: Glucose-Capillary: 562 mg/dL (ref 70–99)
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
## 2018-05-03 MED ORDER — OXYCODONE-ACETAMINOPHEN 5-325 MG PO TABS: 1.0000 | ORAL_TABLET | Freq: Four times a day (QID) | ORAL | 0 refills | Status: DC | PRN

## 2018-05-03 MED ORDER — LACTATED RINGERS IV BOLUS
1000.0000 mL | Freq: Once | INTRAVENOUS | Status: AC
  Administered 2018-05-03: 1000 mL via INTRAVENOUS

## 2018-05-03 MED ORDER — INSULIN REGULAR HUMAN 100 UNIT/ML IJ SOLN
10.0000 [IU] | Freq: Once | INTRAMUSCULAR | Status: AC
  Administered 2018-05-03: 10 [IU] via INTRAVENOUS
  Filled 2018-05-03: qty 1

## 2018-05-03 MED ORDER — ACETAMINOPHEN 500 MG PO TABS
1000.0000 mg | ORAL_TABLET | Freq: Once | ORAL | Status: AC
  Administered 2018-05-03: 1000 mg via ORAL
  Filled 2018-05-03: qty 2

## 2018-05-03 NOTE — ED Notes (Signed)
Per patient he did not take his med this morning.

## 2018-05-03 NOTE — ED Notes (Signed)
Date and time results received: 05/03/18 1648 (use smartphrase ".now" to insert current time)  Test:Serum GLucose Critical Value: 504  Name of Provider Notified: Dr. Dayna Barker  Orders Received? Or Actions Taken?: See flowsheet

## 2018-05-03 NOTE — ED Notes (Signed)
Glucose 504, result given to ED MD and RN
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
## 2018-06-20 ENCOUNTER — Ambulatory Visit (HOSPITAL_BASED_OUTPATIENT_CLINIC_OR_DEPARTMENT_OTHER): Payer: BLUE CROSS/BLUE SHIELD

## 2018-06-23 ENCOUNTER — Ambulatory Visit (HOSPITAL_BASED_OUTPATIENT_CLINIC_OR_DEPARTMENT_OTHER): Payer: BLUE CROSS/BLUE SHIELD

## 2018-10-22 ENCOUNTER — Encounter (HOSPITAL_BASED_OUTPATIENT_CLINIC_OR_DEPARTMENT_OTHER): Payer: Self-pay | Admitting: Emergency Medicine

## 2018-10-22 ENCOUNTER — Emergency Department (HOSPITAL_BASED_OUTPATIENT_CLINIC_OR_DEPARTMENT_OTHER)
Admission: EM | Admit: 2018-10-22 | Discharge: 2018-10-22 | Disposition: A | Discharge status: Home / Self Care | Payer: BLUE CROSS/BLUE SHIELD | Attending: Emergency Medicine | Admitting: Emergency Medicine

## 2018-10-22 ENCOUNTER — Other Ambulatory Visit: Payer: Self-pay

## 2018-10-22 DIAGNOSIS — Z794 Long term (current) use of insulin: Secondary | ICD-10-CM | POA: Diagnosis not present

## 2018-10-22 DIAGNOSIS — F17210 Nicotine dependence, cigarettes, uncomplicated: Secondary | ICD-10-CM | POA: Insufficient documentation

## 2018-10-22 DIAGNOSIS — I10 Essential (primary) hypertension: Secondary | ICD-10-CM | POA: Diagnosis not present

## 2018-10-22 DIAGNOSIS — I2510 Atherosclerotic heart disease of native coronary artery without angina pectoris: Secondary | ICD-10-CM | POA: Diagnosis not present

## 2018-10-22 DIAGNOSIS — E119 Type 2 diabetes mellitus without complications: Secondary | ICD-10-CM | POA: Insufficient documentation

## 2018-10-22 DIAGNOSIS — Z7902 Long term (current) use of antithrombotics/antiplatelets: Secondary | ICD-10-CM | POA: Diagnosis not present

## 2018-10-22 DIAGNOSIS — Z79899 Other long term (current) drug therapy: Secondary | ICD-10-CM | POA: Insufficient documentation

## 2018-10-22 DIAGNOSIS — M79621 Pain in right upper arm: Secondary | ICD-10-CM | POA: Diagnosis present

## 2018-10-22 DIAGNOSIS — L02411 Cutaneous abscess of right axilla: Secondary | ICD-10-CM | POA: Diagnosis not present

## 2018-10-22 HISTORY — DX: Polyneuropathy, unspecified: G62.9

## 2018-10-22 HISTORY — DX: Unspecified osteoarthritis, unspecified site: M19.90

## 2018-10-22 MED ORDER — DOXYCYCLINE HYCLATE 100 MG PO TABS
100.0000 mg | ORAL_TABLET | Freq: Once | ORAL | Status: AC
  Administered 2018-10-22: 100 mg via ORAL
  Filled 2018-10-22: qty 1

## 2018-10-22 MED ORDER — LIDOCAINE-EPINEPHRINE (PF) 2 %-1:200000 IJ SOLN
INTRAMUSCULAR | Status: AC
  Filled 2018-10-22: qty 10

## 2018-10-22 MED ORDER — DOXYCYCLINE HYCLATE 100 MG PO CAPS: 100.0000 mg | ORAL_CAPSULE | Freq: Two times a day (BID) | ORAL | 0 refills | Status: DC

## 2018-10-22 MED ORDER — LIDOCAINE-EPINEPHRINE (PF) 2 %-1:200000 IJ SOLN
10.0000 mL | Freq: Once | INTRAMUSCULAR | Status: AC
  Administered 2018-10-22: 10 mL
  Filled 2018-10-22: qty 10

## 2018-10-22 NOTE — ED Triage Notes (Signed)
Pt states he has a knot under his right arm that is painful  Pt states it came up 2-3 days ago
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
## 2018-10-22 NOTE — ED Provider Notes (Signed)
[place] EMERGENCY DEPARTMENT Provider Note   CSN: [id number] Arrival date & time: 10/22/18  0454    History   Chief Complaint Chief Complaint  Patient presents with  . Abscess    HPI Tyler Brewer is a 58 y.o. male.     Patient presents to the emergency department for evaluation of abscess under his right arm.  Symptoms began 2 or 3 days ago.  Last night he stuck a pain in the area and it has been draining but still is painful, red warm to the touch.  No fever.  He has had similar abscesses in his groin and in his left axilla in the past.     Past Medical History:  Diagnosis Date  . Arthritis   . CAD (coronary artery disease)  cardiac cath with moderate disease in a septal branch of the ramus intermedius 04/01/2012  . Depression   . Diabetes mellitus    poorly controlled by his report  . History of narcotic addiction ([place])    past history of back pain  . Hypercholesteremia   . Hypertension   . IBS (irritable bowel syndrome)   . Neuropathy   . Obesity    Max weight was 390  . OSA on CPAP   . Panic attacks   . Testosterone deficiency   . Vertigo     Patient Active Problem List   Diagnosis Date Noted  . Interstitial lung disease ([place]) 05/02/2017  . Pansinusitis 09/26/2016  . Diabetic polyneuropathy associated with diabetes mellitus due to underlying condition ([place]) 05/08/2016  . Methamphetamine use disorder, severe, dependence ([place]) 02/11/2016  . Substance induced mood disorder ([place]) 02/11/2016  . Exertional dyspnea 11/30/2015  . ADD (attention deficit disorder) 09/29/2015  . Binge eating 09/29/2015  . Syncope 05/05/2012  . Diarrhea 05/05/2012  . Panic attacks 05/05/2012  . OSA (obstructive sleep apnea) 05/05/2012  . HTN (hypertension) 05/05/2012  . Diabetes mellitus, type II ([place]) 05/05/2012  . Dyslipidemia 05/05/2012  . CAD (coronary artery disease)  cardiac cath with moderate disease in a septal branch of the ramus intermedius 04/01/2012    . Chest pain 04/01/2012  . Drug abuse and dependence ([place]) 04/01/2012  . Family history of coronary artery disease 03/31/2012  . Sleep apnea, on C-pap 03/31/2012  . Hyperlipemia 03/30/2012  . HTN (hypertension), benign 03/30/2012  . Morbid obesity ([place]) 03/30/2012  . DM type 2, uncontrolled, with neuropathy ([place]) 03/30/2012  . Depression with anxiety 03/30/2012    Past Surgical History:  Procedure Laterality Date  . CARDIAC CATHETERIZATION    . LEFT HEART CATHETERIZATION WITH CORONARY ANGIOGRAM N/A 03/31/2012   Procedure: LEFT HEART CATHETERIZATION WITH CORONARY ANGIOGRAM;  Surgeon: Leonie Man, MD;  Location: [facility] CATH LAB;  Service: Cardiovascular;  Laterality: N/A;        Home Medications    Prior to Admission medications   Medication Sig Start Date End Date Taking? Authorizing Provider  albuterol (PROVENTIL HFA;VENTOLIN HFA) 108 (90 Base) MCG/ACT inhaler Inhale 2 puffs into the lungs every 6 (six) hours as needed for wheezing or shortness of breath. 06/05/17   Mannam, Hart Robinsons, MD  amLODipine (NORVASC) 10 MG tablet Take 1 tablet (10 mg total) by mouth daily. 05/02/17   [name], DO  benzonatate (TESSALON) 100 MG capsule Take 1 capsule (100 mg total) by mouth every 8 (eight) hours. 08/21/17   Domenic Moras, PA-C  doxycycline (VIBRAMYCIN) 100 MG capsule Take 1 capsule (100 mg total) by mouth 2 (two) times  daily. 10/22/18   Orpah Greek, MD  guaiFENesin 200 MG tablet Take 1 tablet (200 mg total) by mouth every 4 (four) hours as needed for cough or to loosen phlegm. 08/21/17   Domenic Moras, PA-C  Insulin Lispro Prot & Lispro (HUMALOG MIX 75/25 KWIKPEN) (75-25) 100 UNIT/ML Kwikpen Inject 10 Units into the skin 2 (two) times daily. Patient taking differently: Inject 35 Units into the skin 2 (two) times daily.  05/02/17   [name], DO  metFORMIN (GLUCOPHAGE) 1000 MG tablet Take 1 tablet (1,000 mg total) by mouth 2 (two) times daily with a meal. 05/02/17   Carollee Herter, Alferd Apa, DO  oxyCODONE-acetaminophen (PERCOCET) 5-325 MG tablet Take 1 tablet by mouth every 6 (six) hours as needed for severe pain. 05/03/18   Mesner, Corene Cornea, MD  pravastatin (PRAVACHOL) 40 MG tablet Take 1 tablet (40 mg total) by mouth daily. 05/06/17   [name], DO  traMADol (ULTRAM) 50 MG tablet Take 1 tablet (50 mg total) by mouth every 8 (eight) hours as needed. 05/02/17   [name], DO    Family History Family History  Problem Relation Age of Onset  . Diabetes type II Father   . Hypertension Father   . Pancreatic disease Father 91       Deceased  . Healthy Mother   . Healthy Sister   . Healthy Son   . Healthy Daughter     Social History Social History   Tobacco Use  . Smoking status: Current Every Day Smoker    Packs/day: 0.50    Types: Cigarettes  . Smokeless tobacco: Never Used  Substance Use Topics  . Alcohol use: No    Alcohol/week: 0.0 standard drinks  . Drug use: Yes    Types: Methamphetamines     Allergies   Patient has no known allergies.   Review of Systems Review of Systems  Constitutional: Negative for fever.  Skin: Positive for wound.     Physical Exam Updated Vital Signs BP (!) 145/97 (BP Location: Left Arm)   Pulse 88   Temp 97.8 F (36.6 C) (Oral)   Resp 18   Ht 5\' 11"  (1.803 m)   Wt 136.1 kg   SpO2 95%   BMI 41.84 kg/m   Physical Exam Vitals signs and nursing note reviewed.  HENT:     Head: Normocephalic and atraumatic.  Pulmonary:     Effort: Pulmonary effort is normal.  Musculoskeletal: Normal range of motion.  Skin:    Comments: Tender, swollen, erythematous, indurated region posterior aspect of right axilla.  Purulent drainage coming from pinhole and central area of erythema.  Neurological:     Mental Status: He is alert.      ED Treatments / Results  Labs (all labs ordered are listed, but only abnormal results are displayed) Labs Reviewed - No data to  display  EKG None  Radiology No results found.  Procedures .[name] KitchenIncision and Drainage Date/Time: 10/22/2018 5:24 AM Performed by: Orpah Greek, MD Authorized by: Orpah Greek, MD   Consent:    Consent obtained:  Verbal   Consent given by:  Patient   Risks discussed:  Incomplete drainage, pain and bleeding Universal protocol:    Procedure explained and questions answered to patient or proxy's satisfaction: yes     Site/side marked: yes     Immediately prior to procedure a time out was called: yes   Location:    Type:  Abscess   Size:  3 cm   Location:  Trunk   Trunk location: Right axilla. Pre-procedure details:    Skin preparation:  Betadine Anesthesia (see MAR for exact dosages):    Anesthesia method:  Local infiltration   Local anesthetic:  Lidocaine 2% WITH epi Procedure type:    Complexity:  Simple Procedure details:    Incision types:  Single straight   Scalpel blade:  11   Wound management:  Probed and deloculated and irrigated with saline   Drainage:  Purulent   Drainage amount:  Moderate   Wound treatment:  Wound left open   Packing materials:  None Post-procedure details:    Patient tolerance of procedure:  Tolerated well, no immediate complications   (including critical care time)  Medications Ordered in ED Medications  lidocaine-EPINEPHrine (XYLOCAINE W/EPI) 2 %-1:200000 (PF) injection (has no administration in time range)  doxycycline (VIBRA-TABS) tablet 100 mg (has no administration in time range)  lidocaine-EPINEPHrine (XYLOCAINE W/EPI) 2 %-1:200000 (PF) injection 10 mL (10 mLs Infiltration Given 10/22/18 0512)     Initial Impression / Assessment and Plan / ED Course  I have reviewed the triage vital signs and the nursing notes.  Pertinent labs & imaging results that were available during my care of the patient were reviewed by me and considered in my medical decision making (see chart for details).         Final Clinical  Impressions(s) / ED Diagnoses   Final diagnoses:  Abscess of axilla, right    ED Discharge Orders         Ordered    doxycycline (VIBRAMYCIN) 100 MG capsule  2 times daily     10/22/18 0523           Orpah Greek, MD 10/22/18 [phone]

## 2019-03-19 ENCOUNTER — Other Ambulatory Visit: Payer: Self-pay

## 2019-03-19 ENCOUNTER — Emergency Department (HOSPITAL_BASED_OUTPATIENT_CLINIC_OR_DEPARTMENT_OTHER)
Admission: EM | Admit: 2019-03-19 | Discharge: 2019-03-19 | Disposition: A | Discharge status: Home / Self Care | Payer: BLUE CROSS/BLUE SHIELD

## 2019-04-11 ENCOUNTER — Emergency Department (HOSPITAL_BASED_OUTPATIENT_CLINIC_OR_DEPARTMENT_OTHER)
Admission: EM | Admit: 2019-04-11 | Discharge: 2019-04-11 | Disposition: A | Discharge status: Home / Self Care | Payer: Medicaid Other | Attending: Emergency Medicine | Admitting: Emergency Medicine

## 2019-04-11 ENCOUNTER — Other Ambulatory Visit: Payer: Self-pay

## 2019-04-11 ENCOUNTER — Encounter (HOSPITAL_BASED_OUTPATIENT_CLINIC_OR_DEPARTMENT_OTHER): Payer: Self-pay | Admitting: *Deleted

## 2019-04-11 DIAGNOSIS — I2510 Atherosclerotic heart disease of native coronary artery without angina pectoris: Secondary | ICD-10-CM | POA: Insufficient documentation

## 2019-04-11 DIAGNOSIS — I10 Essential (primary) hypertension: Secondary | ICD-10-CM | POA: Insufficient documentation

## 2019-04-11 DIAGNOSIS — F17210 Nicotine dependence, cigarettes, uncomplicated: Secondary | ICD-10-CM | POA: Insufficient documentation

## 2019-04-11 DIAGNOSIS — E119 Type 2 diabetes mellitus without complications: Secondary | ICD-10-CM | POA: Insufficient documentation

## 2019-04-11 DIAGNOSIS — Z79899 Other long term (current) drug therapy: Secondary | ICD-10-CM | POA: Insufficient documentation

## 2019-04-11 DIAGNOSIS — L03031 Cellulitis of right toe: Secondary | ICD-10-CM | POA: Insufficient documentation

## 2019-04-11 MED ORDER — AMOXICILLIN 500 MG PO CAPS: 1000.0000 mg | ORAL_CAPSULE | Freq: Two times a day (BID) | ORAL | 0 refills | Status: DC

## 2019-04-11 MED ORDER — SULFAMETHOXAZOLE-TRIMETHOPRIM 800-160 MG PO TABS: 1.0000 | ORAL_TABLET | Freq: Two times a day (BID) | ORAL | 0 refills | Status: AC

## 2019-04-11 NOTE — ED Notes (Signed)
ED Provider at bedside. 

## 2019-04-11 NOTE — Discharge Instructions (Signed)
I have put you on 2 different antibiotics to cover the 2 most common types of bacteria the cause skin infections.  Please return for rapid spreading redness fever or if you feel unwell.  Please perform warm soaks or warm compresses 4 times a day while awake for the next few days.

## 2019-04-11 NOTE — ED Triage Notes (Signed)
Pt cut ingrown nail in his right big toes 3-4 days ago. Now has redness and swelling. Pt is a diabetic

## 2019-04-11 NOTE — ED Provider Notes (Signed)
[place] EMERGENCY DEPARTMENT Provider Note   CSN: [id number] Arrival date & time: 04/11/19  1450     History   Chief Complaint Chief Complaint  Patient presents with  . Wound Check    HPI Tyler Brewer is a 58 y.o. male.     58 yo M with a chief complaints of swelling to the right great toe.  The patient had an ingrown toenail and he cut it out about 3 or 4 days ago.  Since then has been having redness and swelling and drainage from the area.  He has a history of diabetes and is concerned that he needs antibiotics.  Denies fevers or chills.  The history is provided by the patient.  Wound Check This is a new problem. The current episode started 2 days ago. The problem occurs constantly. The problem has not changed since onset.Pertinent negatives include no chest pain, no abdominal pain, no headaches and no shortness of breath. The symptoms are aggravated by bending and twisting. Nothing relieves the symptoms. He has tried nothing for the symptoms. The treatment provided no relief.    Past Medical History:  Diagnosis Date  . Arthritis   . CAD (coronary artery disease)  cardiac cath with moderate disease in a septal branch of the ramus intermedius 04/01/2012  . Depression   . Diabetes mellitus    poorly controlled by his report  . History of narcotic addiction ([place])    past history of back pain  . Hypercholesteremia   . Hypertension   . IBS (irritable bowel syndrome)   . Neuropathy   . Obesity    Max weight was 390  . OSA on CPAP   . Panic attacks   . Testosterone deficiency   . Vertigo     Patient Active Problem List   Diagnosis Date Noted  . Interstitial lung disease ([place]) 05/02/2017  . Pansinusitis 09/26/2016  . Diabetic polyneuropathy associated with diabetes mellitus due to underlying condition ([place]) 05/08/2016  . Methamphetamine use disorder, severe, dependence ([place]) 02/11/2016  . Substance induced mood disorder ([place]) 02/11/2016  . Exertional  dyspnea 11/30/2015  . ADD (attention deficit disorder) 09/29/2015  . Binge eating 09/29/2015  . Syncope 05/05/2012  . Diarrhea 05/05/2012  . Panic attacks 05/05/2012  . OSA (obstructive sleep apnea) 05/05/2012  . HTN (hypertension) 05/05/2012  . Diabetes mellitus, type II ([place]) 05/05/2012  . Dyslipidemia 05/05/2012  . CAD (coronary artery disease)  cardiac cath with moderate disease in a septal branch of the ramus intermedius 04/01/2012  . Chest pain 04/01/2012  . Drug abuse and dependence ([place]) 04/01/2012  . Family history of coronary artery disease 03/31/2012  . Sleep apnea, on C-pap 03/31/2012  . Hyperlipemia 03/30/2012  . HTN (hypertension), benign 03/30/2012  . Morbid obesity ([place]) 03/30/2012  . DM type 2, uncontrolled, with neuropathy ([place]) 03/30/2012  . Depression with anxiety 03/30/2012    Past Surgical History:  Procedure Laterality Date  . CARDIAC CATHETERIZATION    . LEFT HEART CATHETERIZATION WITH CORONARY ANGIOGRAM N/A 03/31/2012   Procedure: LEFT HEART CATHETERIZATION WITH CORONARY ANGIOGRAM;  Surgeon: Leonie Man, MD;  Location: [facility] CATH LAB;  Service: Cardiovascular;  Laterality: N/A;        Home Medications    Prior to Admission medications   Medication Sig Start Date End Date Taking? Authorizing Provider  albuterol (PROVENTIL HFA;VENTOLIN HFA) 108 (90 Base) MCG/ACT inhaler Inhale 2 puffs into the lungs every 6 (six) hours as needed for wheezing or shortness  of breath. 06/05/17   Mannam, Hart Robinsons, MD  amLODipine (NORVASC) 10 MG tablet Take 1 tablet (10 mg total) by mouth daily. 05/02/17   [name], DO  amoxicillin (AMOXIL) 500 MG capsule Take 2 capsules (1,000 mg total) by mouth 2 (two) times daily. 04/11/19   Deno Etienne, DO  benzonatate (TESSALON) 100 MG capsule Take 1 capsule (100 mg total) by mouth every 8 (eight) hours. 08/21/17   Domenic Moras, PA-C  doxycycline (VIBRAMYCIN) 100 MG capsule Take 1 capsule (100 mg total) by mouth 2 (two) times  daily. 10/22/18   Orpah Greek, MD  guaiFENesin 200 MG tablet Take 1 tablet (200 mg total) by mouth every 4 (four) hours as needed for cough or to loosen phlegm. 08/21/17   Domenic Moras, PA-C  Insulin Lispro Prot & Lispro (HUMALOG MIX 75/25 KWIKPEN) (75-25) 100 UNIT/ML Kwikpen Inject 10 Units into the skin 2 (two) times daily. Patient taking differently: Inject 35 Units into the skin 2 (two) times daily.  05/02/17   [name], DO  metFORMIN (GLUCOPHAGE) 1000 MG tablet Take 1 tablet (1,000 mg total) by mouth 2 (two) times daily with a meal. 05/02/17   Carollee Herter, Alferd Apa, DO  oxyCODONE-acetaminophen (PERCOCET) 5-325 MG tablet Take 1 tablet by mouth every 6 (six) hours as needed for severe pain. 05/03/18   Mesner, Corene Cornea, MD  pravastatin (PRAVACHOL) 40 MG tablet Take 1 tablet (40 mg total) by mouth daily. 05/06/17   [name], DO  sulfamethoxazole-trimethoprim (BACTRIM DS) 800-160 MG tablet Take 1 tablet by mouth 2 (two) times daily for 7 days. 04/11/19 04/18/19  Deno Etienne, DO  traMADol (ULTRAM) 50 MG tablet Take 1 tablet (50 mg total) by mouth every 8 (eight) hours as needed. 05/02/17   [name], DO    Family History Family History  Problem Relation Age of Onset  . Diabetes type II Father   . Hypertension Father   . Pancreatic disease Father 31       Deceased  . Healthy Mother   . Healthy Sister   . Healthy Son   . Healthy Daughter     Social History Social History   Tobacco Use  . Smoking status: Current Every Day Smoker    Packs/day: 0.50    Types: Cigarettes  . Smokeless tobacco: Never Used  Substance Use Topics  . Alcohol use: No    Alcohol/week: 0.0 standard drinks  . Drug use: Yes    Types: Methamphetamines     Allergies   Patient has no known allergies.   Review of Systems Review of Systems  Constitutional: Negative for chills and fever.  HENT: Negative for congestion and facial swelling.   Eyes: Negative for discharge and  visual disturbance.  Respiratory: Negative for shortness of breath.   Cardiovascular: Negative for chest pain and palpitations.  Gastrointestinal: Negative for abdominal pain, diarrhea and vomiting.  Musculoskeletal: Negative for arthralgias and myalgias.  Skin: Positive for wound. Negative for color change and rash.  Neurological: Negative for tremors, syncope and headaches.  Psychiatric/Behavioral: Negative for confusion and dysphoric mood.     Physical Exam Updated Vital Signs BP 119/72 (BP Location: Left Arm)   Pulse 91   Temp 98.9 F (37.2 C) (Oral)   Resp 20   Ht 5\' 11"  (1.803 m)   Wt 133.8 kg   SpO2 99%   BMI 41.14 kg/m   Physical Exam Vitals signs and nursing note reviewed.  Constitutional:  Appearance: He is well-developed.  HENT:     Head: Normocephalic and atraumatic.  Eyes:     Pupils: Pupils are equal, round, and reactive to light.  Neck:     Musculoskeletal: Normal range of motion and neck supple.     Vascular: No JVD.  Cardiovascular:     Rate and Rhythm: Normal rate and regular rhythm.     Heart sounds: No murmur. No friction rub. No gallop.   Pulmonary:     Effort: No respiratory distress.     Breath sounds: No wheezing.  Abdominal:     General: There is no distension.     Tenderness: There is no guarding or rebound.  Musculoskeletal: Normal range of motion.     Comments: Right great toe with a cut away medial aspect of the nail.  There is some mild erythema and no appreciable fluctuance to the base of the nail.  There are some old sides of drainage.  Skin:    Coloration: Skin is not pale.     Findings: No rash.  Neurological:     Mental Status: He is alert and oriented to person, place, and time.  Psychiatric:        Behavior: Behavior normal.      ED Treatments / Results  Labs (all labs ordered are listed, but only abnormal results are displayed) Labs Reviewed - No data to display  EKG None  Radiology No results found.   Procedures Procedures (including critical care time)  Medications Ordered in ED Medications - No data to display   Initial Impression / Assessment and Plan / ED Course  I have reviewed the triage vital signs and the nursing notes.  Pertinent labs & imaging results that were available during my care of the patient were reviewed by me and considered in my medical decision making (see chart for details).        58 yo M with a chief complaints of a paronychia to the right toe after attempting to remove an ingrown toenail by himself.  There is no abscess that I feel that I can drain.  He would like to be prescribed an antibiotic that is free at [facility].  I will prescribe Bactrim and amoxicillin to try and cover both staph and strep respectively.  He is to be followed up by his PCP.   4:13 PM:  I have discussed the diagnosis/risks/treatment options with the patient and believe the pt to be eligible for discharge home to follow-up with PCP. We also discussed returning to the ED immediately if new or worsening sx occur. We discussed the sx which are most concerning (e.g., sudden worsening pain, fever, inability to tolerate by mouth) that necessitate immediate return. Medications administered to the patient during their visit and any new prescriptions provided to the patient are listed below.  Medications given during this visit Medications - No data to display   The patient appears reasonably screen and/or stabilized for discharge and I doubt any other medical condition or other [facility] requiring further screening, evaluation, or treatment in the ED at this time prior to discharge.    Final Clinical Impressions(s) / ED Diagnoses   Final diagnoses:  Paronychia of great toe of right foot    ED Discharge Orders         Ordered    sulfamethoxazole-trimethoprim (BACTRIM DS) 800-160 MG tablet  2 times daily     04/11/19 1604    amoxicillin (AMOXIL) 500 MG capsule  2 times  daily     04/11/19 [place], [place], DO 04/11/19 [phone]

## 2019-04-14 ENCOUNTER — Other Ambulatory Visit: Payer: Self-pay

## 2019-04-17 ENCOUNTER — Ambulatory Visit: Payer: Medicaid Other | Admitting: Family Medicine

## 2019-08-14 ENCOUNTER — Other Ambulatory Visit: Payer: Self-pay

## 2019-08-14 ENCOUNTER — Emergency Department (HOSPITAL_BASED_OUTPATIENT_CLINIC_OR_DEPARTMENT_OTHER)
Admission: EM | Admit: 2019-08-14 | Discharge: 2019-08-14 | Disposition: A | Discharge status: Home / Self Care | Payer: Medicaid Other | Attending: Emergency Medicine | Admitting: Emergency Medicine

## 2019-08-14 ENCOUNTER — Encounter (HOSPITAL_BASED_OUTPATIENT_CLINIC_OR_DEPARTMENT_OTHER): Payer: Self-pay | Admitting: *Deleted

## 2019-08-14 DIAGNOSIS — I2510 Atherosclerotic heart disease of native coronary artery without angina pectoris: Secondary | ICD-10-CM | POA: Insufficient documentation

## 2019-08-14 DIAGNOSIS — L02414 Cutaneous abscess of left upper limb: Secondary | ICD-10-CM | POA: Insufficient documentation

## 2019-08-14 DIAGNOSIS — I10 Essential (primary) hypertension: Secondary | ICD-10-CM | POA: Insufficient documentation

## 2019-08-14 DIAGNOSIS — E119 Type 2 diabetes mellitus without complications: Secondary | ICD-10-CM | POA: Insufficient documentation

## 2019-08-14 DIAGNOSIS — F17210 Nicotine dependence, cigarettes, uncomplicated: Secondary | ICD-10-CM | POA: Insufficient documentation

## 2019-08-14 DIAGNOSIS — L02419 Cutaneous abscess of limb, unspecified: Secondary | ICD-10-CM

## 2019-08-14 DIAGNOSIS — G629 Polyneuropathy, unspecified: Secondary | ICD-10-CM

## 2019-08-14 MED ORDER — DOXYCYCLINE HYCLATE 100 MG PO CAPS: 100.0000 mg | ORAL_CAPSULE | Freq: Two times a day (BID) | ORAL | 0 refills | Status: AC

## 2019-08-14 MED ORDER — GABAPENTIN 300 MG PO CAPS: 300.0000 mg | ORAL_CAPSULE | Freq: Three times a day (TID) | ORAL | 0 refills | Status: DC

## 2019-08-14 NOTE — ED Triage Notes (Signed)
Bilateral leg pain x 3 weeks. ambulatory without difficulty.

## 2019-08-14 NOTE — Discharge Instructions (Signed)
You were seen in the emergency department today with pain in the legs.  This is likely from nerve damage in the legs.  I am treating you with gabapentin.  Do not take this with alcohol or other sedating medications.  Please call your primary care doctor first thing on Monday.  Please also take the antibiotics as directed for your skin infection.

## 2019-08-14 NOTE — ED Provider Notes (Signed)
Emergency Department Provider Note   I have reviewed the triage vital signs and the nursing notes.   HISTORY  Chief Complaint Leg Pain   HPI Tyler Brewer is a 57 y.o. male presents to the emergency department for evaluation of burning/electric type pain in the bilateral lower extremities.  He states that symptoms have been going on for "a while" but that pain has been persistent.  He has not been diagnosed with neuropathy.  He denies injury, fever, redness in the legs.  He tells me that his diabetes is poorly controlled but that he does follow with a primary care physician and has been mostly keeping up with taking his medicines.  Patient also describes some rash underneath his left arm and has required drainage of abscess in the past.  He denies fever/chills.  No respiratory symptoms.  No back pain, urine incontinence, groin numbness. No unilateral weakness.   Past Medical History:  Diagnosis Date  . Arthritis   . CAD (coronary artery disease)  cardiac cath with moderate disease in a septal branch of the ramus intermedius 04/01/2012  . Depression   . Diabetes mellitus    poorly controlled by his report  . History of narcotic addiction ([place])    past history of back pain  . Hypercholesteremia   . Hypertension   . IBS (irritable bowel syndrome)   . Neuropathy   . Obesity    Max weight was 390  . OSA on CPAP   . Panic attacks   . Testosterone deficiency   . Vertigo     Patient Active Problem List   Diagnosis Date Noted  . Interstitial lung disease ([place]) 05/02/2017  . Pansinusitis 09/26/2016  . Diabetic polyneuropathy associated with diabetes mellitus due to underlying condition ([place]) 05/08/2016  . Methamphetamine use disorder, severe, dependence ([place]) 02/11/2016  . Substance induced mood disorder ([place]) 02/11/2016  . Exertional dyspnea 11/30/2015  . ADD (attention deficit disorder) 09/29/2015  . Binge eating 09/29/2015  . Syncope 05/05/2012  . Diarrhea 05/05/2012  .  Panic attacks 05/05/2012  . OSA (obstructive sleep apnea) 05/05/2012  . HTN (hypertension) 05/05/2012  . Diabetes mellitus, type II ([place]) 05/05/2012  . Dyslipidemia 05/05/2012  . CAD (coronary artery disease)  cardiac cath with moderate disease in a septal branch of the ramus intermedius 04/01/2012  . Chest pain 04/01/2012  . Drug abuse and dependence ([place]) 04/01/2012  . Family history of coronary artery disease 03/31/2012  . Sleep apnea, on C-pap 03/31/2012  . Hyperlipemia 03/30/2012  . HTN (hypertension), benign 03/30/2012  . Morbid obesity ([place]) 03/30/2012  . DM type 2, uncontrolled, with neuropathy ([place]) 03/30/2012  . Depression with anxiety 03/30/2012    Past Surgical History:  Procedure Laterality Date  . CARDIAC CATHETERIZATION    . LEFT HEART CATHETERIZATION WITH CORONARY ANGIOGRAM N/A 03/31/2012   Procedure: LEFT HEART CATHETERIZATION WITH CORONARY ANGIOGRAM;  Surgeon: Leonie Man, MD;  Location: [facility] CATH LAB;  Service: Cardiovascular;  Laterality: N/A;    Allergies Patient has no known allergies.  Family History  Problem Relation Age of Onset  . Diabetes type II Father   . Hypertension Father   . Pancreatic disease Father 50       Deceased  . Healthy Mother   . Healthy Sister   . Healthy Son   . Healthy Daughter     Social History Social History   Tobacco Use  . Smoking status: Current Every Day Smoker    Packs/day: 0.50  Types: Cigarettes  . Smokeless tobacco: Never Used  Substance Use Topics  . Alcohol use: No    Alcohol/week: 0.0 standard drinks  . Drug use: Yes    Types: Methamphetamines    Review of Systems  Constitutional: No fever/chills Eyes: No visual changes. ENT: No sore throat. Cardiovascular: Denies chest pain. Respiratory: Denies shortness of breath. Gastrointestinal: No abdominal pain.  No nausea, no vomiting.  No diarrhea.  No constipation. Genitourinary: Negative for dysuria. Musculoskeletal: Negative for back pain.  Bilateral foot pain.  Skin: Rash under left armpit.  Neurological: Negative for headaches, focal weakness or numbness.  10-point ROS otherwise negative.  ____________________________________________   PHYSICAL EXAM:  VITAL SIGNS: ED Triage Vitals  Enc Vitals Group     BP 08/14/19 1925 (!) 179/104     Pulse Rate 08/14/19 1925 88     Resp 08/14/19 1925 20     Temp 08/14/19 1925 98 F (36.7 C)     Temp Source 08/14/19 1925 Oral     SpO2 08/14/19 1925 100 %     Weight 08/14/19 1924 290 lb (131.5 kg)     Height 08/14/19 1924 5\' 11"  (1.803 m)   Constitutional: Alert and oriented. Well appearing and in no acute distress. Eyes: Conjunctivae are normal.  Head: Atraumatic. Nose: No congestion/rhinnorhea. Mouth/Throat: Mucous membranes are moist.  Neck: No stridor.   Cardiovascular: Normal rate, regular rhythm. Good peripheral circulation. Grossly normal heart sounds.   Respiratory: Normal respiratory effort.  No retractions. Lungs CTAB. Gastrointestinal: Soft and nontender. No distention.  Musculoskeletal: No lower extremity tenderness. No gross deformities of extremities. Normal ROM of bilateral LE.  Neurologic:  Normal speech and language. No gross focal neurologic deficits are appreciated.  Skin:  Skin is warm, dry and intact.  Multiple areas of redness underneath the left axilla.  No fluctuance or significant/diffuse cellulitis.  I do not see an area suitable for drainage.    ____________________________________________  RADIOLOGY  None ____________________________________________   PROCEDURES  Procedure(s) performed:   Procedures  None ____________________________________________   INITIAL IMPRESSION / ASSESSMENT AND PLAN / ED COURSE  Pertinent labs & imaging results that were available during my care of the patient were reviewed by me and considered in my medical decision making (see chart for details).   Patient arrives to the emergency department with  complaint of longstanding pain in the lower extremities.  His description and presentation is most consistent with neuropathy.  He does have diabetes which is by his own admission poorly controlled.  He is not having clinical signs or other symptoms to suspect DKA or other diabetes emergency.  He also mentions rash under his left axilla.  I plan for antibiotics and do not see an area that requires incision/drainage at this time.  Plan to start gabapentin with likely neuropathy type pain but discussed that patient will need to call his PCP on Monday for follow-up as his medication often requires titration and ongoing management.  They can also discuss his diabetes management.  We discussed the direct link between his diabetes and likely neuropathy symptoms.  Discussed ED return precautions.   ____________________________________________  FINAL CLINICAL IMPRESSION(S) / ED DIAGNOSES  Final diagnoses:  Neuropathy  Axillary abscess    NEW OUTPATIENT MEDICATIONS STARTED DURING THIS VISIT:  Discharge Medication List as of 08/14/2019  7:58 PM    START taking these medications   Details  gabapentin (NEURONTIN) 300 MG capsule Take 1 capsule (300 mg total) by mouth 3 (three) times daily  for 14 days., Starting Fri 08/14/2019, Until Fri 08/28/2019, Normal        Note:  This document was prepared using Dragon voice recognition software and may include unintentional dictation errors.  Nanda Quinton, MD, [facility] Emergency Medicine    Bensen Chadderdon, Wonda Olds, MD 08/15/19 [phone]

## 2019-08-23 ENCOUNTER — Other Ambulatory Visit: Payer: Self-pay

## 2019-08-23 ENCOUNTER — Encounter (HOSPITAL_BASED_OUTPATIENT_CLINIC_OR_DEPARTMENT_OTHER): Payer: Self-pay

## 2019-08-23 DIAGNOSIS — R0602 Shortness of breath: Secondary | ICD-10-CM | POA: Insufficient documentation

## 2019-08-23 DIAGNOSIS — M7918 Myalgia, other site: Secondary | ICD-10-CM | POA: Insufficient documentation

## 2019-08-23 DIAGNOSIS — R519 Headache, unspecified: Secondary | ICD-10-CM | POA: Insufficient documentation

## 2019-08-23 DIAGNOSIS — Z5321 Procedure and treatment not carried out due to patient leaving prior to being seen by health care provider: Secondary | ICD-10-CM | POA: Insufficient documentation

## 2019-08-23 DIAGNOSIS — R439 Unspecified disturbances of smell and taste: Secondary | ICD-10-CM | POA: Insufficient documentation

## 2019-08-23 LAB — CBC WITH DIFFERENTIAL/PLATELET
Abs Immature Granulocytes: 0.03 10*3/uL (ref 0.00–0.07)
Basophils Absolute: 0 10*3/uL (ref 0.0–0.1)
Basophils Relative: 0 %
Eosinophils Absolute: 0.3 10*3/uL (ref 0.0–0.5)
Eosinophils Relative: 6 %
HCT: 45.8 % (ref 39.0–52.0)
Hemoglobin: 15.7 g/dL (ref 13.0–17.0)
Immature Granulocytes: 1 %
Lymphocytes Relative: 19 %
Lymphs Abs: 1.1 10*3/uL (ref 0.7–4.0)
MCH: 28.4 pg (ref 26.0–34.0)
MCHC: 34.3 g/dL (ref 30.0–36.0)
MCV: 83 fL (ref 80.0–100.0)
Monocytes Absolute: 0.7 10*3/uL (ref 0.1–1.0)
Monocytes Relative: 12 %
Neutro Abs: 3.8 10*3/uL (ref 1.7–7.7)
Neutrophils Relative %: 62 %
Platelets: 267 10*3/uL (ref 150–400)
RBC: 5.52 MIL/uL (ref 4.22–5.81)
RDW: 11.9 % (ref 11.5–15.5)
WBC: 6 10*3/uL (ref 4.0–10.5)
nRBC: 0 % (ref 0.0–0.2)

## 2019-08-23 LAB — COMPREHENSIVE METABOLIC PANEL
ALT: 17 U/L (ref 0–44)
AST: 16 U/L (ref 15–41)
Albumin: 3.1 g/dL — ABNORMAL LOW (ref 3.5–5.0)
Alkaline Phosphatase: 84 U/L (ref 38–126)
Anion gap: 9 (ref 5–15)
BUN: 12 mg/dL (ref 6–20)
CO2: 20 mmol/L — ABNORMAL LOW (ref 22–32)
Calcium: 8.6 mg/dL — ABNORMAL LOW (ref 8.9–10.3)
Chloride: 101 mmol/L (ref 98–111)
Creatinine, Ser: 0.68 mg/dL (ref 0.61–1.24)
GFR calc Af Amer: >60 mL/min (ref >60)
GFR calc non Af Amer: >60 mL/min (ref >60)
Glucose, Bld: 188 mg/dL — ABNORMAL HIGH (ref 70–99)
Potassium: 3.8 mmol/L (ref 3.5–5.1)
Sodium: 130 mmol/L — ABNORMAL LOW (ref 135–145)
Total Bilirubin: 0.7 mg/dL (ref 0.3–1.2)
Total Protein: 7.1 g/dL (ref 6.5–8.1)

## 2019-08-23 LAB — URINALYSIS, ROUTINE W REFLEX MICROSCOPIC
Bilirubin Urine: NEGATIVE
Glucose, UA: >=500 mg/dL — AB
Hgb urine dipstick: NEGATIVE
Ketones, ur: 15 mg/dL — AB
Leukocytes,Ua: NEGATIVE
Nitrite: NEGATIVE
Protein, ur: NEGATIVE mg/dL
Specific Gravity, Urine: 1.015 (ref 1.005–1.030)
pH: 6.5 (ref 5.0–8.0)

## 2019-08-23 LAB — LACTIC ACID, PLASMA: Lactic Acid, Venous: 1.3 mmol/L (ref 0.5–1.9)

## 2019-08-23 LAB — URINALYSIS, MICROSCOPIC (REFLEX)

## 2019-08-23 LAB — CBG MONITORING, ED: Glucose-Capillary: 160 mg/dL — ABNORMAL HIGH (ref 70–99)

## 2019-08-23 NOTE — ED Notes (Signed)
Pt to vending machines

## 2019-08-23 NOTE — ED Notes (Signed)
Pt states he feels like his sugar is low. CBG checked in lobby 160.

## 2019-08-23 NOTE — ED Triage Notes (Signed)
Pt c/o ShOB, fatigue, body aches, HA, sore throat, loss of taste x 4 days. Unknown about COVID contacts, but has been out in gaming rooms.

## 2019-08-24 ENCOUNTER — Emergency Department (HOSPITAL_BASED_OUTPATIENT_CLINIC_OR_DEPARTMENT_OTHER)
Admission: EM | Admit: 2019-08-24 | Discharge: 2019-08-24 | Disposition: A | Discharge status: Home / Self Care | Payer: Medicaid Other | Attending: Emergency Medicine | Admitting: Emergency Medicine

## 2019-08-24 NOTE — ED Notes (Signed)
No answer in lobby.

## 2019-09-21 ENCOUNTER — Emergency Department (HOSPITAL_BASED_OUTPATIENT_CLINIC_OR_DEPARTMENT_OTHER)
Admission: EM | Admit: 2019-09-21 | Discharge: 2019-09-21 | Disposition: A | Discharge status: Home / Self Care | Payer: Self-pay | Attending: Emergency Medicine | Admitting: Emergency Medicine

## 2019-09-21 ENCOUNTER — Emergency Department (HOSPITAL_BASED_OUTPATIENT_CLINIC_OR_DEPARTMENT_OTHER): Payer: Self-pay

## 2019-09-21 ENCOUNTER — Other Ambulatory Visit: Payer: Self-pay

## 2019-09-21 ENCOUNTER — Encounter (HOSPITAL_BASED_OUTPATIENT_CLINIC_OR_DEPARTMENT_OTHER): Payer: Self-pay

## 2019-09-21 DIAGNOSIS — R42 Dizziness and giddiness: Secondary | ICD-10-CM | POA: Insufficient documentation

## 2019-09-21 DIAGNOSIS — E782 Mixed hyperlipidemia: Secondary | ICD-10-CM | POA: Insufficient documentation

## 2019-09-21 DIAGNOSIS — I10 Essential (primary) hypertension: Secondary | ICD-10-CM | POA: Insufficient documentation

## 2019-09-21 DIAGNOSIS — Z794 Long term (current) use of insulin: Secondary | ICD-10-CM | POA: Insufficient documentation

## 2019-09-21 DIAGNOSIS — R0789 Other chest pain: Secondary | ICD-10-CM | POA: Insufficient documentation

## 2019-09-21 DIAGNOSIS — E119 Type 2 diabetes mellitus without complications: Secondary | ICD-10-CM | POA: Insufficient documentation

## 2019-09-21 DIAGNOSIS — F17210 Nicotine dependence, cigarettes, uncomplicated: Secondary | ICD-10-CM | POA: Insufficient documentation

## 2019-09-21 DIAGNOSIS — I2510 Atherosclerotic heart disease of native coronary artery without angina pectoris: Secondary | ICD-10-CM | POA: Insufficient documentation

## 2019-09-21 DIAGNOSIS — Z9861 Coronary angioplasty status: Secondary | ICD-10-CM | POA: Insufficient documentation

## 2019-09-21 DIAGNOSIS — Z79899 Other long term (current) drug therapy: Secondary | ICD-10-CM | POA: Insufficient documentation

## 2019-09-21 LAB — CBC WITH DIFFERENTIAL/PLATELET
Abs Immature Granulocytes: 0.02 10*3/uL (ref 0.00–0.07)
Basophils Absolute: 0 10*3/uL (ref 0.0–0.1)
Basophils Relative: 1 %
Eosinophils Absolute: 0.2 10*3/uL (ref 0.0–0.5)
Eosinophils Relative: 4 %
HCT: 47.9 % (ref 39.0–52.0)
Hemoglobin: 16.3 g/dL (ref 13.0–17.0)
Immature Granulocytes: 0 %
Lymphocytes Relative: 30 %
Lymphs Abs: 1.8 10*3/uL (ref 0.7–4.0)
MCH: 28.4 pg (ref 26.0–34.0)
MCHC: 34 g/dL (ref 30.0–36.0)
MCV: 83.4 fL (ref 80.0–100.0)
Monocytes Absolute: 0.6 10*3/uL (ref 0.1–1.0)
Monocytes Relative: 10 %
Neutro Abs: 3.4 10*3/uL (ref 1.7–7.7)
Neutrophils Relative %: 55 %
Platelets: 221 10*3/uL (ref 150–400)
RBC: 5.74 MIL/uL (ref 4.22–5.81)
RDW: 13.2 % (ref 11.5–15.5)
WBC: 6.1 10*3/uL (ref 4.0–10.5)
nRBC: 0 % (ref 0.0–0.2)

## 2019-09-21 LAB — URINALYSIS, ROUTINE W REFLEX MICROSCOPIC
Bilirubin Urine: NEGATIVE
Glucose, UA: >=500 mg/dL — AB
Hgb urine dipstick: NEGATIVE
Ketones, ur: NEGATIVE mg/dL
Leukocytes,Ua: NEGATIVE
Nitrite: NEGATIVE
Protein, ur: NEGATIVE mg/dL
Specific Gravity, Urine: 1.01 (ref 1.005–1.030)
pH: 5.5 (ref 5.0–8.0)

## 2019-09-21 LAB — RAPID URINE DRUG SCREEN, HOSP PERFORMED
Amphetamines: POSITIVE — AB
Barbiturates: NOT DETECTED
Benzodiazepines: NOT DETECTED
Cocaine: NOT DETECTED
Opiates: NOT DETECTED
Tetrahydrocannabinol: NOT DETECTED

## 2019-09-21 LAB — BASIC METABOLIC PANEL
Anion gap: 7 (ref 5–15)
BUN: 18 mg/dL (ref 6–20)
CO2: 22 mmol/L (ref 22–32)
Calcium: 9.3 mg/dL (ref 8.9–10.3)
Chloride: 98 mmol/L (ref 98–111)
Creatinine, Ser: 0.9 mg/dL (ref 0.61–1.24)
GFR calc Af Amer: >60 mL/min (ref >60)
GFR calc non Af Amer: >60 mL/min (ref >60)
Glucose, Bld: 398 mg/dL — ABNORMAL HIGH (ref 70–99)
Potassium: 4.6 mmol/L (ref 3.5–5.1)
Sodium: 127 mmol/L — ABNORMAL LOW (ref 135–145)

## 2019-09-21 LAB — CBG MONITORING, ED: Glucose-Capillary: 370 mg/dL — ABNORMAL HIGH (ref 70–99)

## 2019-09-21 LAB — URINALYSIS, MICROSCOPIC (REFLEX)
RBC / HPF: NONE SEEN RBC/hpf (ref 0–5)
WBC, UA: NONE SEEN WBC/hpf (ref 0–5)

## 2019-09-21 LAB — TROPONIN I (HIGH SENSITIVITY)
Troponin I (High Sensitivity): 9 ng/L
Troponin I (High Sensitivity): 8 ng/L (ref <18)

## 2019-09-21 IMAGING — CR DG CHEST 2V
2 series · 2 of 2 positions shown · non-contrast
Comparison: [DATE].

CLINICAL DATA: Chest pain.

EXAM:
CHEST - 2 VIEW

[w chest pa]
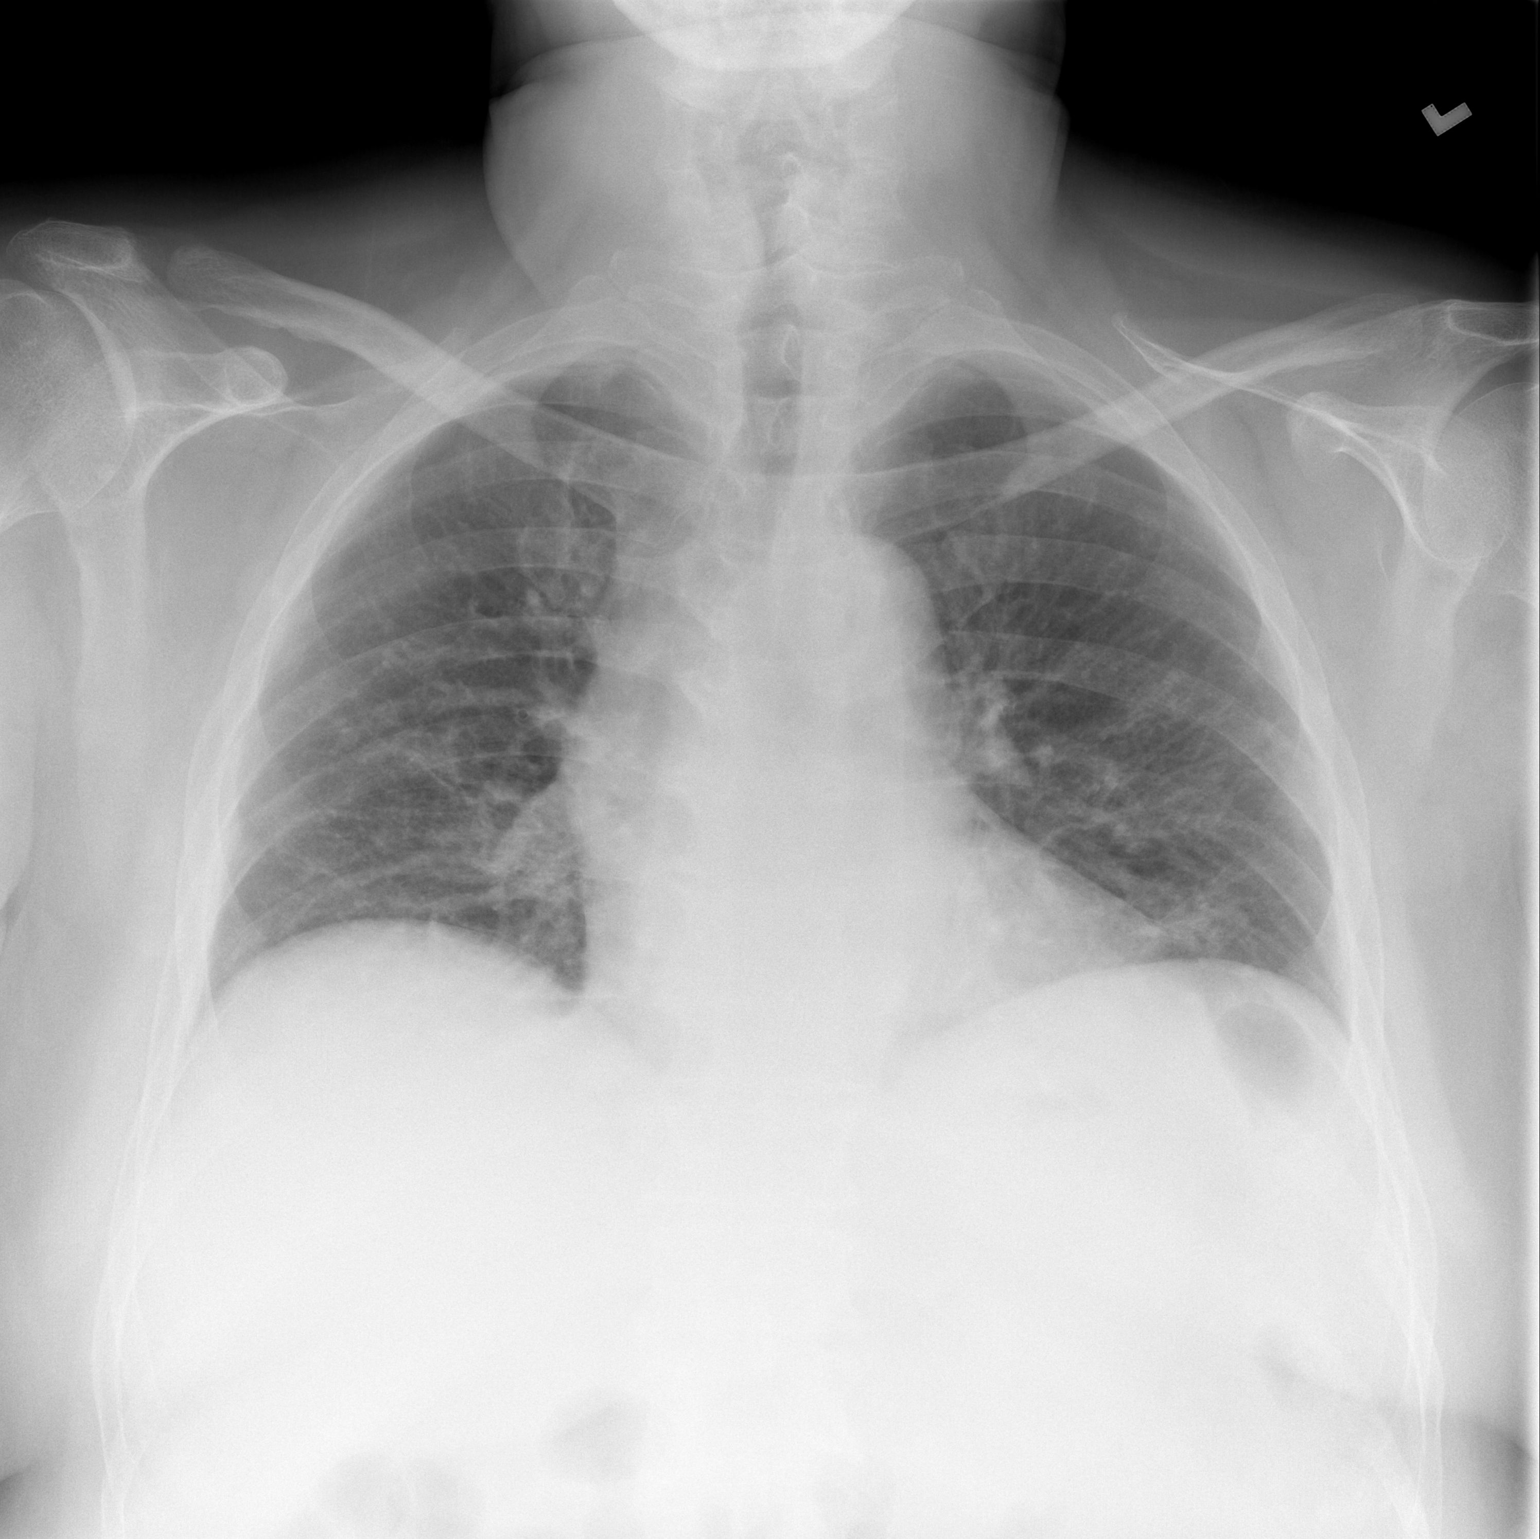

[w chest lat]
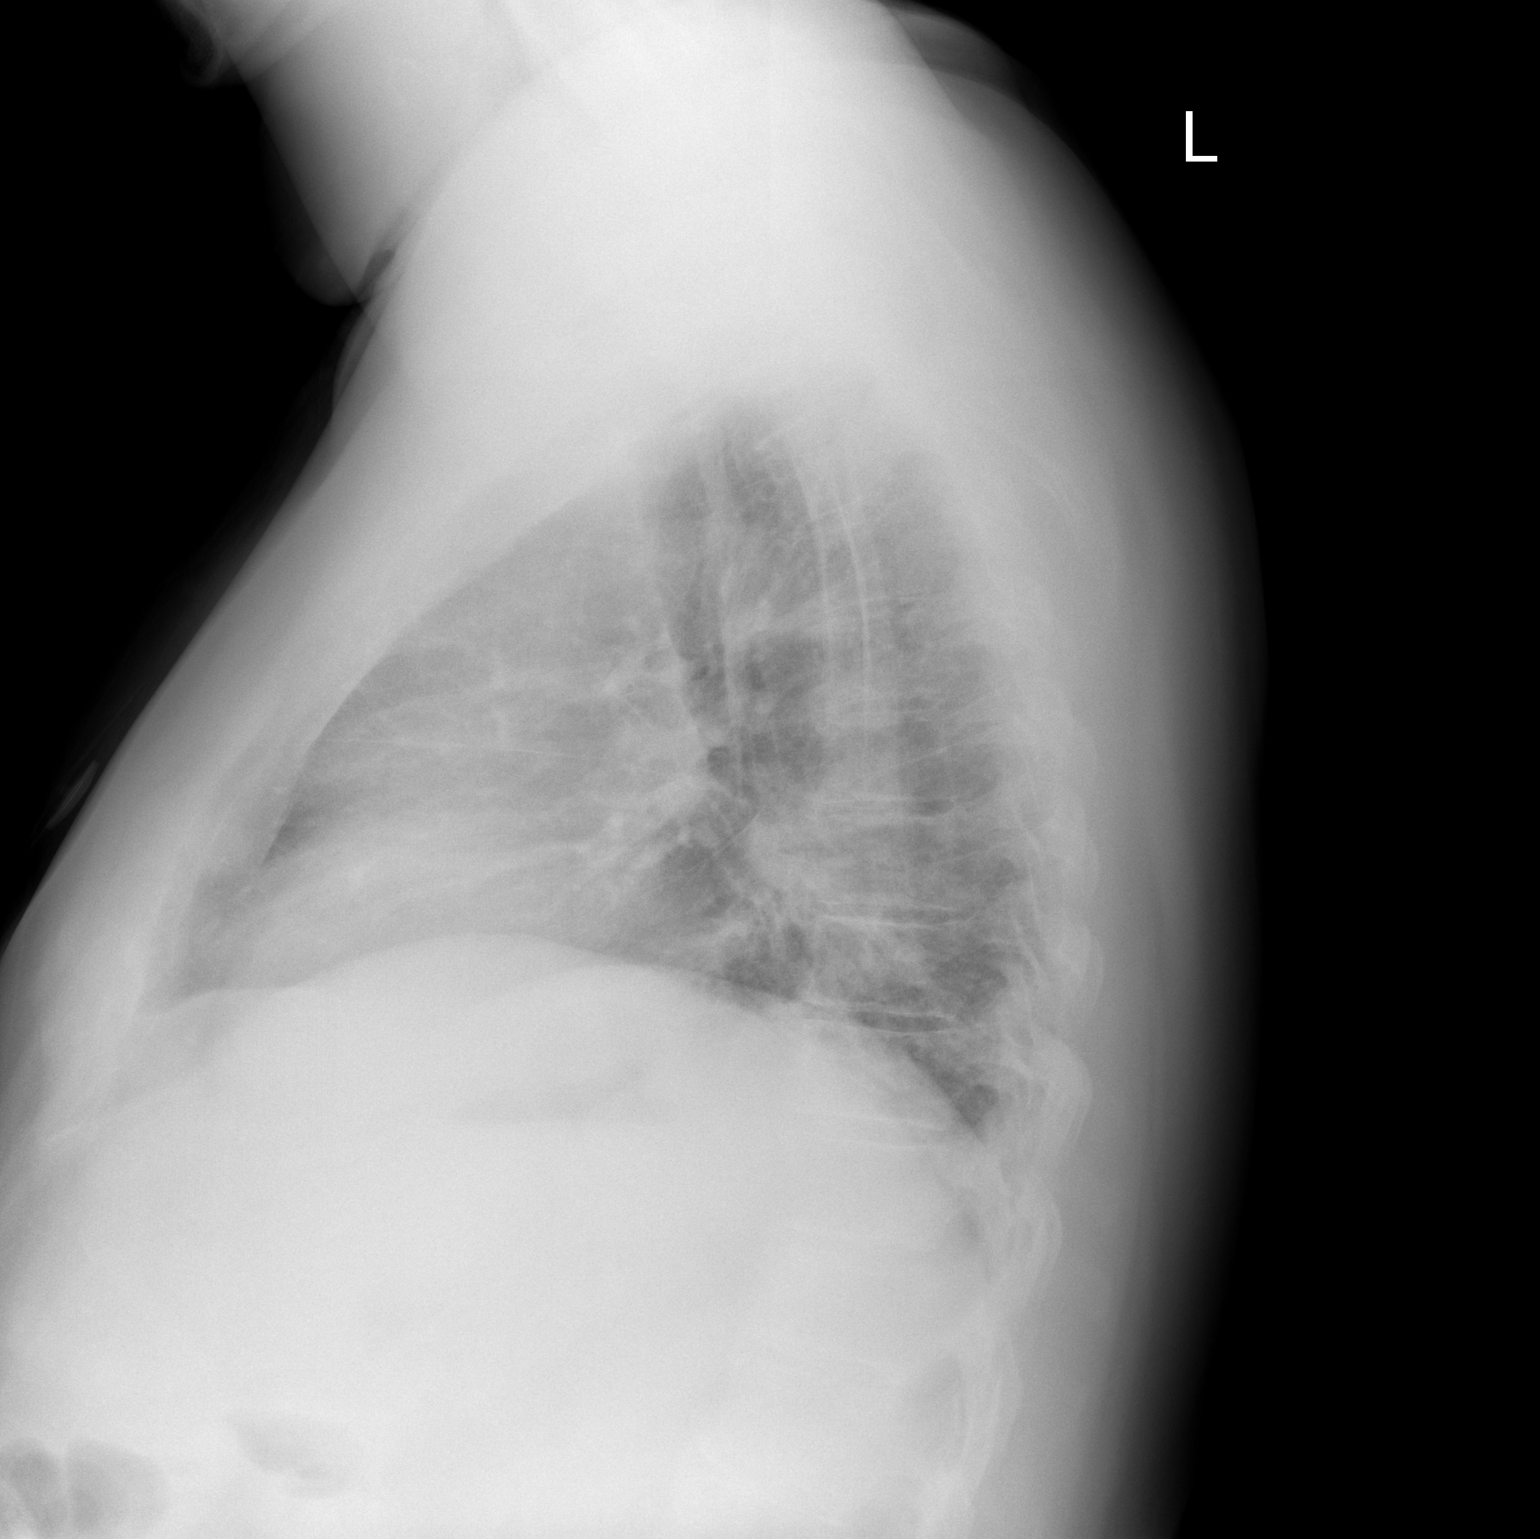

[2 of 2 positions shown; findings below may reference images not displayed]

FINDINGS: The heart size and mediastinal contours are within normal limits. No
pneumothorax or pleural effusion is noted. Hypoinflation of the
lungs is noted with minimal bibasilar subsegmental atelectasis. The
visualized skeletal structures are unremarkable.
IMPRESSION: Hypoinflation of the lungs with minimal bibasilar subsegmental
atelectasis.

## 2019-09-21 MED ORDER — AMLODIPINE BESYLATE 5 MG PO TABS
10.0000 mg | ORAL_TABLET | Freq: Once | ORAL | Status: AC
  Administered 2019-09-21: 10 mg via ORAL
  Filled 2019-09-21: qty 2

## 2019-09-21 MED ORDER — INSULIN GLARGINE 100 UNIT/ML ~~LOC~~ SOLN
10.0000 [IU] | Freq: Once | SUBCUTANEOUS | Status: AC
  Administered 2019-09-21: 10 [IU] via SUBCUTANEOUS
  Filled 2019-09-21: qty 1

## 2019-09-21 NOTE — ED Triage Notes (Addendum)
Pt c/o dizziness, heart racing and bilat leg weakness started 10pm last night-pt feels sx may be related to drinking punch that had "something"/drugs in it-"couple other people that drank it felt the same way"  -NAD-steady gait

## 2019-09-21 NOTE — ED Provider Notes (Signed)
[place] EMERGENCY DEPARTMENT Provider Note   CSN: [id number] Arrival date & time: 09/21/19  1443     History Chief Complaint  Patient presents with  . Dizziness    Tyler Brewer is a 59 y.o. male with PMHx CAD, Diabetes, HTN, depression who presents to the ED today complaining of sudden onset, constant, gradually improving, lightheadedness that began last night.  Also complains of palpitations and bilateral leg weakness.  He states that he has history of neuropathy but states that the weakness/pain in his bilateral legs feels worse since last night.  He states that all the symptoms started after he drank punch at a birthday party last night.  Patient states he thinks someone put drugs in it.  He reports a history of methamphetamine use but states he has been clean for about 9 to 10 months.  He states that other individuals who drank the punch as well felt the same when he states it felt exactly like when he was using meth.  She reports that he also began having intermittent chest pain and shortness of breath with this.  He has no active chest pain currently.  States it felt like he was having anxiety attack, does not take anything for anxiety currently. Pt has also been out of his blood pressure medications for about 1 week now.   The history is provided by the patient and medical records.       Past Medical History:  Diagnosis Date  . Arthritis   . CAD (coronary artery disease)  cardiac cath with moderate disease in a septal branch of the ramus intermedius 04/01/2012  . Depression   . Diabetes mellitus    poorly controlled by his report  . History of narcotic addiction ([place])    past history of back pain  . Hypercholesteremia   . Hypertension   . IBS (irritable bowel syndrome)   . Neuropathy   . Obesity    Max weight was 390  . OSA on CPAP   . Panic attacks   . Testosterone deficiency   . Vertigo     Patient Active Problem List   Diagnosis Date Noted  .  Interstitial lung disease ([place]) 05/02/2017  . Pansinusitis 09/26/2016  . Diabetic polyneuropathy associated with diabetes mellitus due to underlying condition ([place]) 05/08/2016  . Methamphetamine use disorder, severe, dependence ([place]) 02/11/2016  . Substance induced mood disorder ([place]) 02/11/2016  . Exertional dyspnea 11/30/2015  . ADD (attention deficit disorder) 09/29/2015  . Binge eating 09/29/2015  . Syncope 05/05/2012  . Diarrhea 05/05/2012  . Panic attacks 05/05/2012  . OSA (obstructive sleep apnea) 05/05/2012  . HTN (hypertension) 05/05/2012  . Diabetes mellitus, type II ([place]) 05/05/2012  . Dyslipidemia 05/05/2012  . CAD (coronary artery disease)  cardiac cath with moderate disease in a septal branch of the ramus intermedius 04/01/2012  . Chest pain 04/01/2012  . Drug abuse and dependence ([place]) 04/01/2012  . Family history of coronary artery disease 03/31/2012  . Sleep apnea, on C-pap 03/31/2012  . Hyperlipemia 03/30/2012  . HTN (hypertension), benign 03/30/2012  . Morbid obesity ([place]) 03/30/2012  . DM type 2, uncontrolled, with neuropathy ([place]) 03/30/2012  . Depression with anxiety 03/30/2012    Past Surgical History:  Procedure Laterality Date  . CARDIAC CATHETERIZATION    . LEFT HEART CATHETERIZATION WITH CORONARY ANGIOGRAM N/A 03/31/2012   Procedure: LEFT HEART CATHETERIZATION WITH CORONARY ANGIOGRAM;  Surgeon: Leonie Man, MD;  Location: [facility] CATH LAB;  Service: Cardiovascular;  Laterality: N/A;       Family History  Problem Relation Age of Onset  . Diabetes type II Father   . Hypertension Father   . Pancreatic disease Father 54       Deceased  . Healthy Mother   . Healthy Sister   . Healthy Son   . Healthy Daughter     Social History   Tobacco Use  . Smoking status: Current Every Day Smoker    Packs/day: 0.50    Types: Cigarettes  . Smokeless tobacco: Never Used  Substance Use Topics  . Alcohol use: No    Alcohol/week: 0.0 standard drinks  . Drug  use: Not Currently    Types: Methamphetamines    Home Medications Prior to Admission medications   Medication Sig Start Date End Date Taking? Authorizing Provider  albuterol (PROVENTIL HFA;VENTOLIN HFA) 108 (90 Base) MCG/ACT inhaler Inhale 2 puffs into the lungs every 6 (six) hours as needed for wheezing or shortness of breath. 06/05/17   Mannam, Hart Robinsons, MD  amLODipine (NORVASC) 10 MG tablet Take 1 tablet (10 mg total) by mouth daily. 05/02/17   [name], DO  amoxicillin (AMOXIL) 500 MG capsule Take 2 capsules (1,000 mg total) by mouth 2 (two) times daily. 04/11/19   Deno Etienne, DO  benzonatate (TESSALON) 100 MG capsule Take 1 capsule (100 mg total) by mouth every 8 (eight) hours. 08/21/17   Domenic Moras, PA-C  gabapentin (NEURONTIN) 300 MG capsule Take 1 capsule (300 mg total) by mouth 3 (three) times daily for 14 days. 08/14/19 08/28/19  Long, Wonda Olds, MD  guaiFENesin 200 MG tablet Take 1 tablet (200 mg total) by mouth every 4 (four) hours as needed for cough or to loosen phlegm. 08/21/17   Domenic Moras, PA-C  Insulin Lispro Prot & Lispro (HUMALOG MIX 75/25 KWIKPEN) (75-25) 100 UNIT/ML Kwikpen Inject 10 Units into the skin 2 (two) times daily. Patient taking differently: Inject 35 Units into the skin 2 (two) times daily.  05/02/17   [name], DO  metFORMIN (GLUCOPHAGE) 1000 MG tablet Take 1 tablet (1,000 mg total) by mouth 2 (two) times daily with a meal. 05/02/17   Carollee Herter, Alferd Apa, DO  oxyCODONE-acetaminophen (PERCOCET) 5-325 MG tablet Take 1 tablet by mouth every 6 (six) hours as needed for severe pain. 05/03/18   Mesner, Corene Cornea, MD  pravastatin (PRAVACHOL) 40 MG tablet Take 1 tablet (40 mg total) by mouth daily. 05/06/17   [name], DO  traMADol (ULTRAM) 50 MG tablet Take 1 tablet (50 mg total) by mouth every 8 (eight) hours as needed. 05/02/17   [name], DO    Allergies    Patient has no known allergies.  Review of Systems   Review of  Systems  Constitutional: Negative for chills and fever.  Eyes: Negative for visual disturbance.  Respiratory: Positive for shortness of breath. Negative for cough.   Cardiovascular: Positive for chest pain and palpitations. Negative for leg swelling.  Gastrointestinal: Negative for abdominal pain, constipation, diarrhea, nausea and vomiting.  Neurological: Positive for light-headedness. Negative for seizures, syncope, weakness, numbness and headaches.  All other systems reviewed and are negative.   Physical Exam Updated Vital Signs BP (!) 178/129 (BP Location: Left Arm) Comment: noncompliant  Pulse 86   Temp 97.6 F (36.4 C) (Oral)   Resp 20   SpO2 96%   Physical Exam Vitals and nursing note reviewed.  Constitutional:      Appearance:  He is obese. He is not ill-appearing or diaphoretic.  HENT:     Head: Normocephalic and atraumatic.  Eyes:     Conjunctiva/sclera: Conjunctivae normal.  Cardiovascular:     Rate and Rhythm: Normal rate and regular rhythm.     Pulses: Normal pulses.  Pulmonary:     Effort: Pulmonary effort is normal.     Breath sounds: Normal breath sounds. No wheezing, rhonchi or rales.  Abdominal:     Palpations: Abdomen is soft.     Tenderness: There is no abdominal tenderness. There is no guarding or rebound.  Musculoskeletal:     Cervical back: Neck supple.  Skin:    General: Skin is warm and dry.  Neurological:     Mental Status: He is alert.     ED Results / Procedures / Treatments   Labs (all labs ordered are listed, but only abnormal results are displayed) Labs Reviewed  URINALYSIS, ROUTINE W REFLEX MICROSCOPIC - Abnormal; Notable for the following components:      Result Value   Glucose, UA >=500 (*)    All other components within normal limits  RAPID URINE DRUG SCREEN, HOSP PERFORMED - Abnormal; Notable for the following components:   Amphetamines POSITIVE (*)    All other components within normal limits  BASIC METABOLIC PANEL -  Abnormal; Notable for the following components:   Sodium 127 (*)    Glucose, Bld 398 (*)    All other components within normal limits  URINALYSIS, MICROSCOPIC (REFLEX) - Abnormal; Notable for the following components:   Bacteria, UA RARE (*)    All other components within normal limits  CBG MONITORING, ED - Abnormal; Notable for the following components:   Glucose-Capillary 370 (*)    All other components within normal limits  CBC WITH DIFFERENTIAL/PLATELET  TROPONIN I (HIGH SENSITIVITY)  TROPONIN I (HIGH SENSITIVITY)    EKG EKG Interpretation  Date/Time:  Monday September 21 2019 14:50:04 EST Ventricular Rate:  83 PR Interval:  196 QRS Duration: 70 QT Interval:  350 QTC Calculation: 411 R Axis:   -25 Text Interpretation: Normal sinus rhythm Inferior infarct , age undetermined Anterior infarct , age undetermined Abnormal ECG No significant change since 12/10/2016 Confirmed by Veryl Speak [phone]) on 09/21/2019 3:49:29 PM   Radiology DG Chest 2 View  Result Date: 09/21/2019 CLINICAL DATA:  Chest pain. EXAM: CHEST - 2 VIEW COMPARISON:  September 26, 2018. FINDINGS: The heart size and mediastinal contours are within normal limits. No pneumothorax or pleural effusion is noted. Hypoinflation of the lungs is noted with minimal bibasilar subsegmental atelectasis. The visualized skeletal structures are unremarkable. IMPRESSION: Hypoinflation of the lungs with minimal bibasilar subsegmental atelectasis. Electronically Signed   By: Marijo Conception M.D.   On: 09/21/2019 16:57    Procedures Procedures (including critical care time)  Medications Ordered in ED Medications  amLODipine (NORVASC) tablet 10 mg (10 mg Oral Given 09/21/19 1636)  insulin glargine (LANTUS) injection 10 Units (10 Units Subcutaneous Given 09/21/19 2011)    ED Course  I have reviewed the triage vital signs and the nursing notes.  Pertinent labs & imaging results that were available during my care of the patient were  reviewed by me and considered in my medical decision making (see chart for details).  Clinical Course as of Sep 20 2100  Mon Sep 21, 2019  1701 Amphetamines(!): POSITIVE [MV]    Clinical Course User Index [MV] Eustaquio Maize, [place]    59 year old male who presents the ED  today complaining of dizziness, palpitations, bilateral leg pain/weakness that started 10 PM last night after he believes someone placed meth in the punch at a party last night.  States he feels back to his baseline now however he did have some chest pain and shortness of breath afterwards.  Arrival to the ED patient is afebrile, nontachycardic and nontachypneic.  His blood pressure is elevated, states he has been out of his blood pressure medication for the past week, has a follow-up appointment with his PCP scheduled.  He has no focal neuro deficits on exam today.  He has bilateral leg pain however he states that he has neuropathy and this is unchanged.  Strength 5 out of 5 bilaterally.  No concern for DVT. Will work-up with screening labs, UDS, chest x-ray, EKG.  Will reevaluate.  CBC without leukocytosis. Hgb stable.  BMP with sodium 127. Pt drinking water in the ED. Glucose 398; will give insulin.  U/A with > 500 glucose, no signs of infection.  UDS positive for amphetamines.  Troponin initially 8; repeat 8 EKG without ischemic changes CXR clear  Orthostatics negative.   Repeat CBG 370 however patient has been snacking on graham crackers. He reports he feels improved; he just wanted to know if someone truly did put something in the drink. Pt states he is ready to go home and he has insulin at home he can take. Given he does not want to stay feel he is stable for discharge home.   This note was prepared using Dragon voice recognition software and may include unintentional dictation errors due to the inherent limitations of voice recognition software.   MDM Rules/Calculators/A&P                       Final Clinical  Impression(s) / ED Diagnoses Final diagnoses:  Dizziness  Essential hypertension    Rx / DC Orders ED Discharge Orders    None       Eustaquio Maize, PA-C 09/21/19 2102    Veryl Speak, MD 09/22/19 [phone]

## 2019-09-21 NOTE — Discharge Instructions (Signed)
Please follow up with your PCP regarding your blood pressure medications.  Take your insulin as prescribed.

## 2019-09-21 NOTE — ED Notes (Signed)
Patient observed walking about room without signs of distress. Patient given crackers.

## 2019-11-04 ENCOUNTER — Other Ambulatory Visit: Payer: Self-pay

## 2019-11-04 ENCOUNTER — Emergency Department (HOSPITAL_BASED_OUTPATIENT_CLINIC_OR_DEPARTMENT_OTHER)
Admission: EM | Admit: 2019-11-04 | Discharge: 2019-11-04 | Disposition: A | Discharge status: Home / Self Care | Payer: Self-pay | Attending: Emergency Medicine | Admitting: Emergency Medicine

## 2019-11-04 ENCOUNTER — Emergency Department (HOSPITAL_BASED_OUTPATIENT_CLINIC_OR_DEPARTMENT_OTHER): Payer: Self-pay

## 2019-11-04 ENCOUNTER — Encounter (HOSPITAL_BASED_OUTPATIENT_CLINIC_OR_DEPARTMENT_OTHER): Payer: Self-pay | Admitting: *Deleted

## 2019-11-04 DIAGNOSIS — Z794 Long term (current) use of insulin: Secondary | ICD-10-CM | POA: Insufficient documentation

## 2019-11-04 DIAGNOSIS — F17210 Nicotine dependence, cigarettes, uncomplicated: Secondary | ICD-10-CM | POA: Insufficient documentation

## 2019-11-04 DIAGNOSIS — Z79899 Other long term (current) drug therapy: Secondary | ICD-10-CM | POA: Insufficient documentation

## 2019-11-04 DIAGNOSIS — I10 Essential (primary) hypertension: Secondary | ICD-10-CM | POA: Insufficient documentation

## 2019-11-04 DIAGNOSIS — R739 Hyperglycemia, unspecified: Secondary | ICD-10-CM

## 2019-11-04 DIAGNOSIS — I2510 Atherosclerotic heart disease of native coronary artery without angina pectoris: Secondary | ICD-10-CM | POA: Insufficient documentation

## 2019-11-04 DIAGNOSIS — R531 Weakness: Secondary | ICD-10-CM | POA: Insufficient documentation

## 2019-11-04 DIAGNOSIS — E782 Mixed hyperlipidemia: Secondary | ICD-10-CM | POA: Insufficient documentation

## 2019-11-04 DIAGNOSIS — E1165 Type 2 diabetes mellitus with hyperglycemia: Secondary | ICD-10-CM | POA: Insufficient documentation

## 2019-11-04 HISTORY — DX: Other stimulant dependence, uncomplicated: F15.20

## 2019-11-04 LAB — COMPREHENSIVE METABOLIC PANEL
ALT: 20 U/L (ref 0–44)
AST: 16 U/L (ref 15–41)
Albumin: 3.5 g/dL (ref 3.5–5.0)
Alkaline Phosphatase: 89 U/L (ref 38–126)
Anion gap: 10 (ref 5–15)
BUN: 20 mg/dL (ref 6–20)
CO2: 23 mmol/L (ref 22–32)
Calcium: 8.8 mg/dL — ABNORMAL LOW (ref 8.9–10.3)
Chloride: 98 mmol/L (ref 98–111)
Creatinine, Ser: 1.12 mg/dL (ref 0.61–1.24)
GFR calc Af Amer: >60 mL/min (ref >60)
GFR calc non Af Amer: >60 mL/min (ref >60)
Glucose, Bld: 580 mg/dL (ref 70–99)
Potassium: 4.2 mmol/L (ref 3.5–5.1)
Sodium: 131 mmol/L — ABNORMAL LOW (ref 135–145)
Total Bilirubin: 0.6 mg/dL (ref 0.3–1.2)
Total Protein: 6.8 g/dL (ref 6.5–8.1)

## 2019-11-04 LAB — CBC WITH DIFFERENTIAL/PLATELET
Abs Immature Granulocytes: 0.04 10*3/uL (ref 0.00–0.07)
Basophils Absolute: 0.1 10*3/uL (ref 0.0–0.1)
Basophils Relative: 1 %
Eosinophils Absolute: 0.2 10*3/uL (ref 0.0–0.5)
Eosinophils Relative: 3 %
HCT: 49.2 % (ref 39.0–52.0)
Hemoglobin: 16.5 g/dL (ref 13.0–17.0)
Immature Granulocytes: 1 %
Lymphocytes Relative: 21 %
Lymphs Abs: 1.2 10*3/uL (ref 0.7–4.0)
MCH: 28.5 pg (ref 26.0–34.0)
MCHC: 33.5 g/dL (ref 30.0–36.0)
MCV: 85 fL (ref 80.0–100.0)
Monocytes Absolute: 0.5 10*3/uL (ref 0.1–1.0)
Monocytes Relative: 8 %
Neutro Abs: 3.8 10*3/uL (ref 1.7–7.7)
Neutrophils Relative %: 66 %
Platelets: 253 10*3/uL (ref 150–400)
RBC: 5.79 MIL/uL (ref 4.22–5.81)
RDW: 13.4 % (ref 11.5–15.5)
WBC: 5.8 10*3/uL (ref 4.0–10.5)
nRBC: 0 % (ref 0.0–0.2)

## 2019-11-04 LAB — URINALYSIS, ROUTINE W REFLEX MICROSCOPIC
Bilirubin Urine: NEGATIVE
Glucose, UA: >=500 mg/dL — AB
Hgb urine dipstick: NEGATIVE
Ketones, ur: NEGATIVE mg/dL
Leukocytes,Ua: NEGATIVE
Nitrite: NEGATIVE
Protein, ur: NEGATIVE mg/dL
Specific Gravity, Urine: 1.01 (ref 1.005–1.030)
pH: 5.5 (ref 5.0–8.0)

## 2019-11-04 LAB — URINALYSIS, MICROSCOPIC (REFLEX)

## 2019-11-04 LAB — TSH: TSH: 1.876 u[IU]/mL (ref 0.350–4.500)

## 2019-11-04 LAB — TROPONIN I (HIGH SENSITIVITY): Troponin I (High Sensitivity): 12 ng/L (ref <18)

## 2019-11-04 LAB — CBG MONITORING, ED: Glucose-Capillary: 343 mg/dL — ABNORMAL HIGH (ref 70–99)

## 2019-11-04 IMAGING — CR DG CHEST 2V
2 series · 2 of 2 positions shown · non-contrast
Comparison: [DATE]

CLINICAL DATA: Weeks of dizziness, weakness, shortness of breath

EXAM:
CHEST - 2 VIEW

[w chest pa]
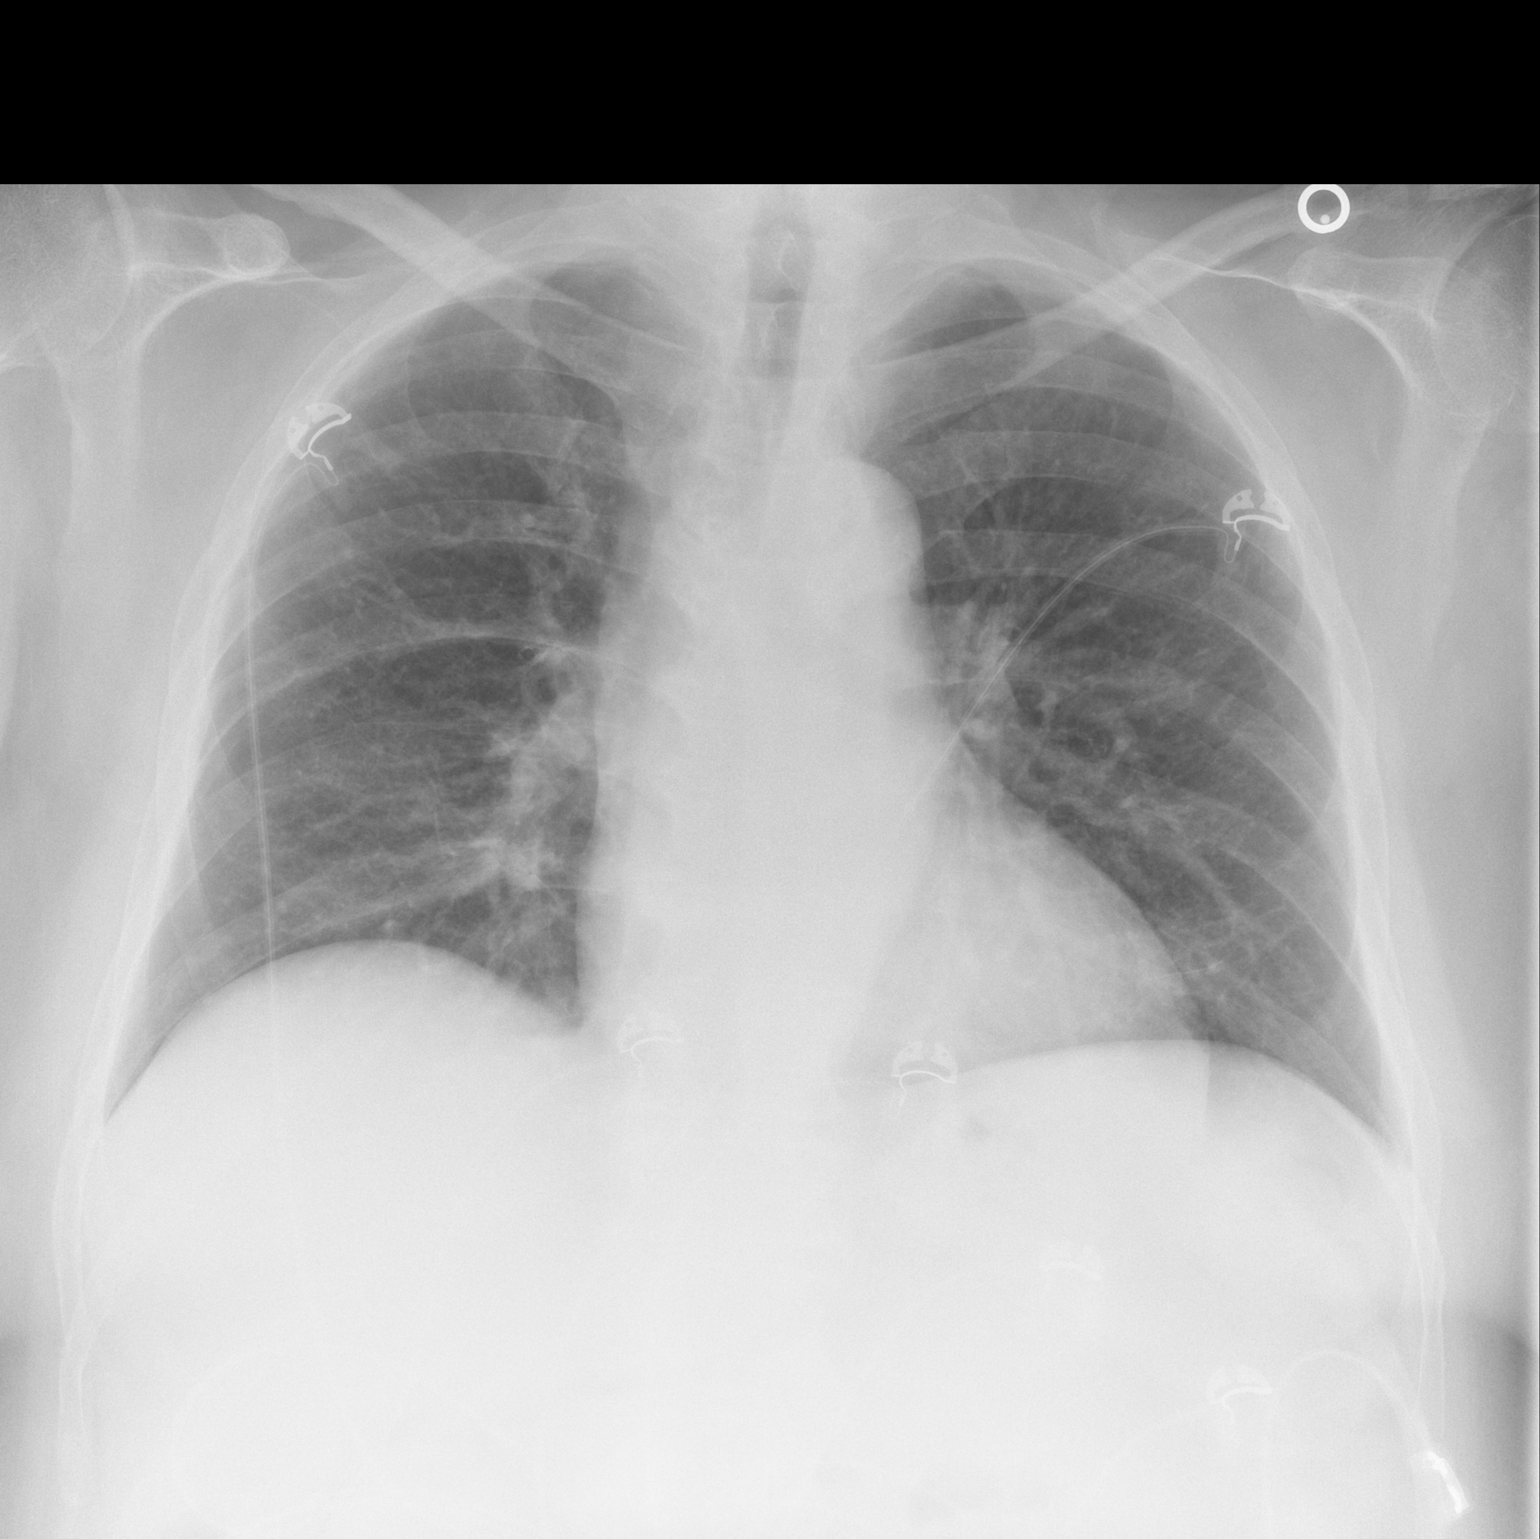

[w chest lat]
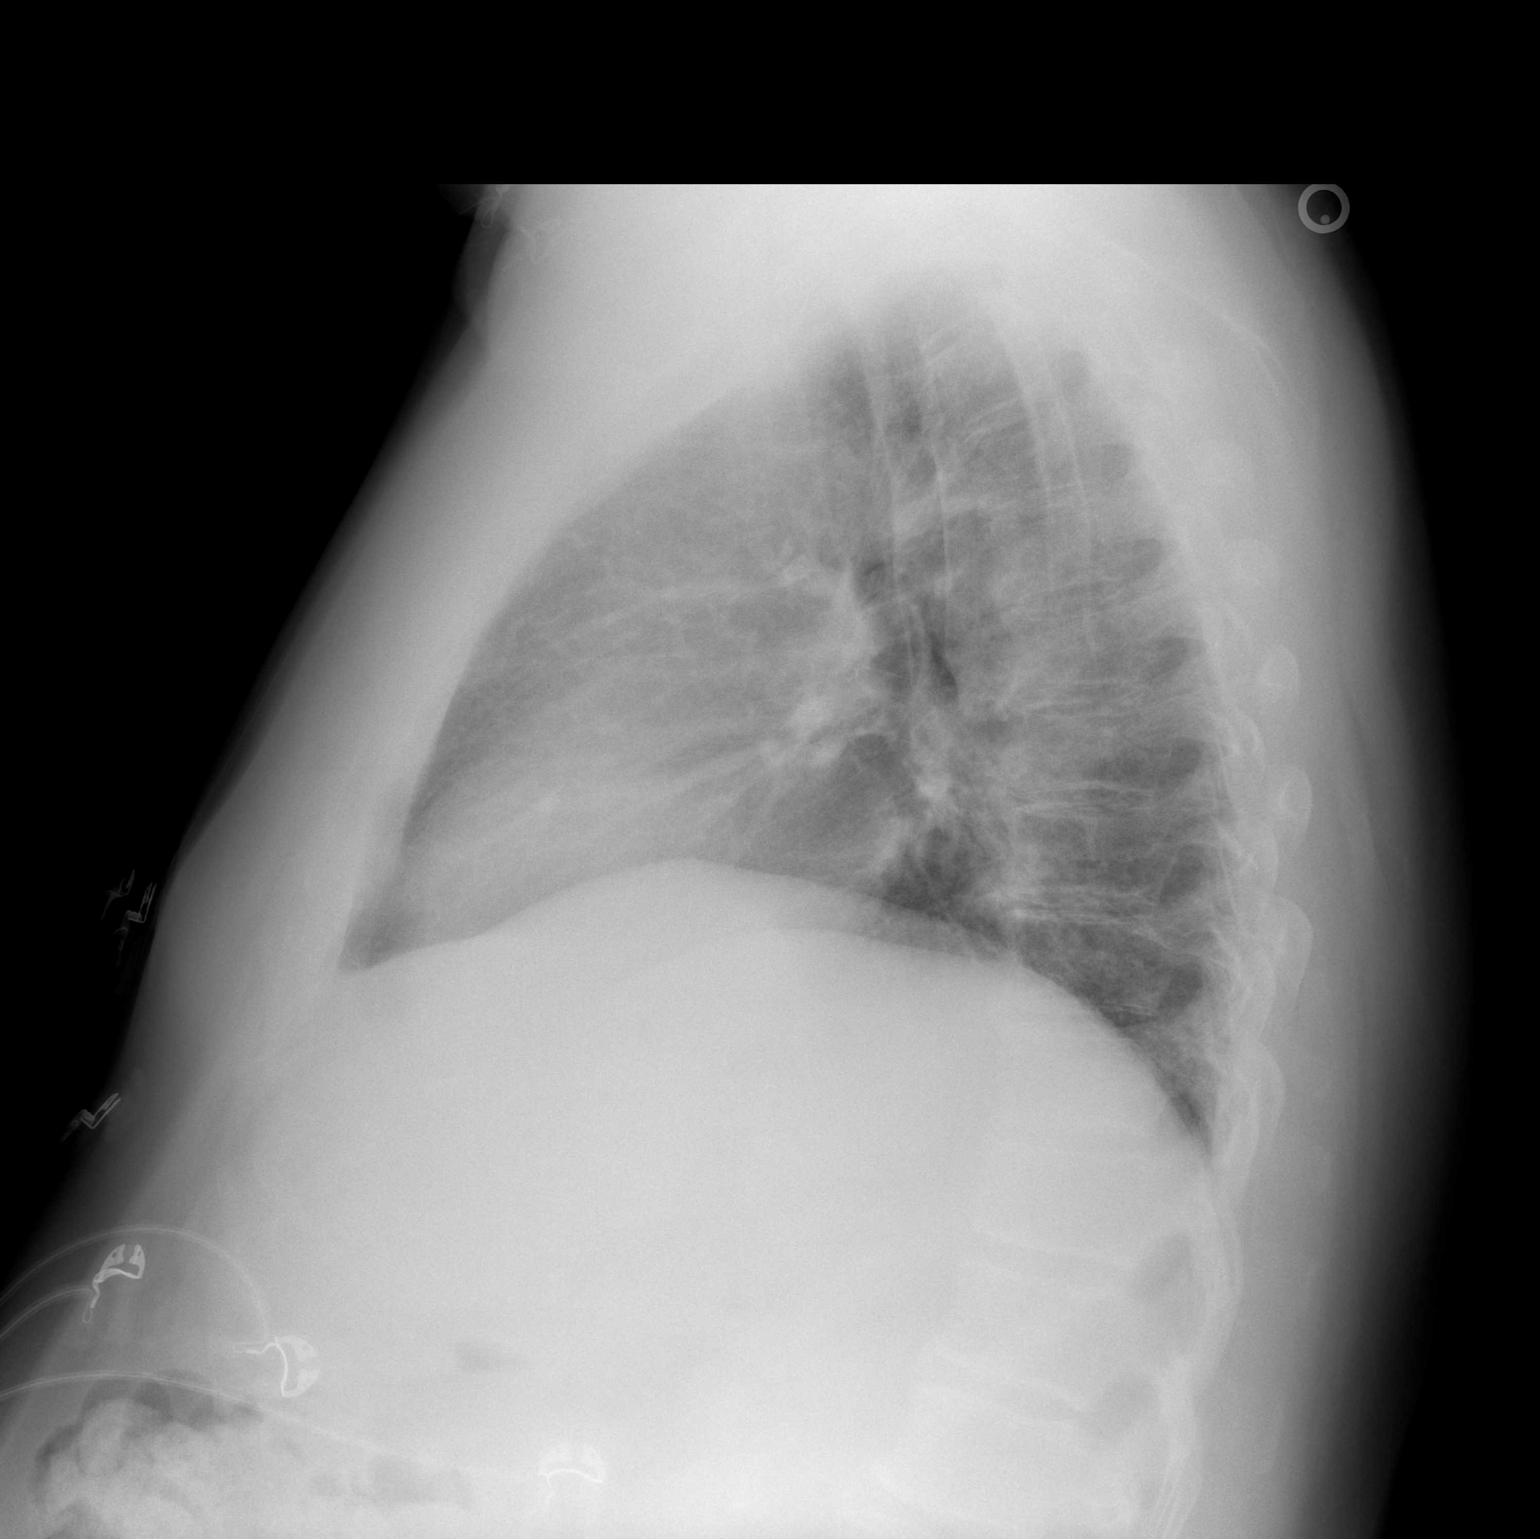

[2 of 2 positions shown; findings below may reference images not displayed]

FINDINGS: The heart size and mediastinal contours are within normal limits.
Both lungs are clear. Disc degenerative disease of the thoracic
spine.
IMPRESSION: No acute abnormality of the lungs.

## 2019-11-04 IMAGING — CT CT HEAD W/O CM
3 of 6 series · 15 of 47 positions shown, 18 images · non-contrast
Comparison: [DATE]

CLINICAL DATA: Ataxia, dizziness, weakness, shortness of breath,
worsening symptoms for weeks, question stroke, history hypertension
and vertigo

EXAM:
CT HEAD WITHOUT CONTRAST
TECHNIQUE: Contiguous axial images were obtained from the base of the skull
through the vertex without intravenous contrast. Sagittal and
coronal MPR images reconstructed from axial data set.

[Series 2: head wo · axial · 0.45mm/px · z∈[-184,-44]mm · 10 of 32 slices shown, 13 images]
[im 2/32  brain]
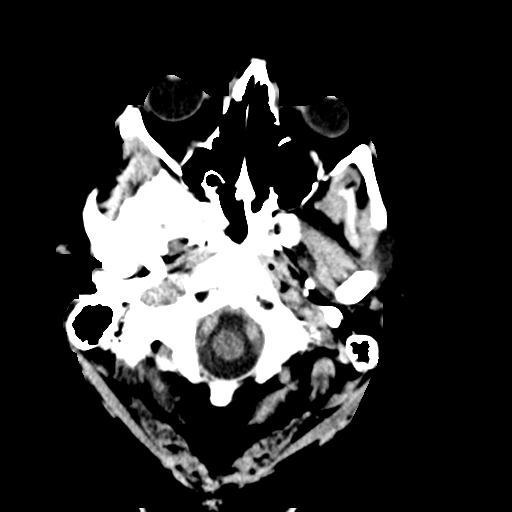
[im 2/32  bone]
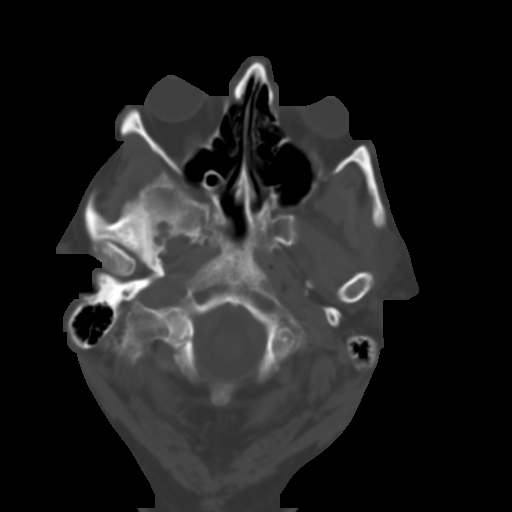
[im 5/32  brain]
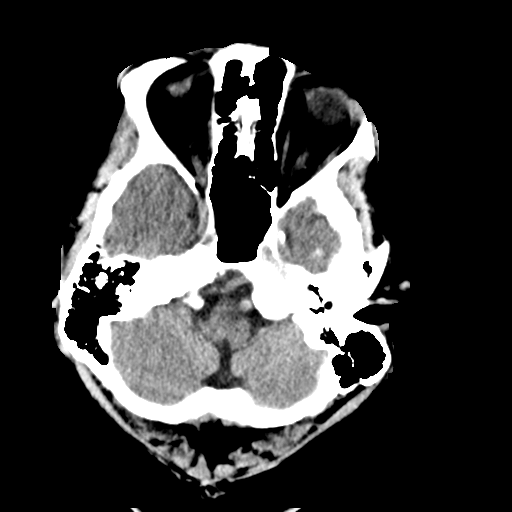
[im 9/32  brain]
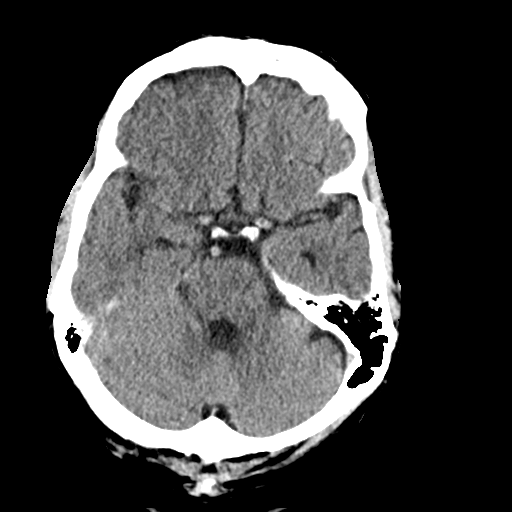
[im 12/32  brain]
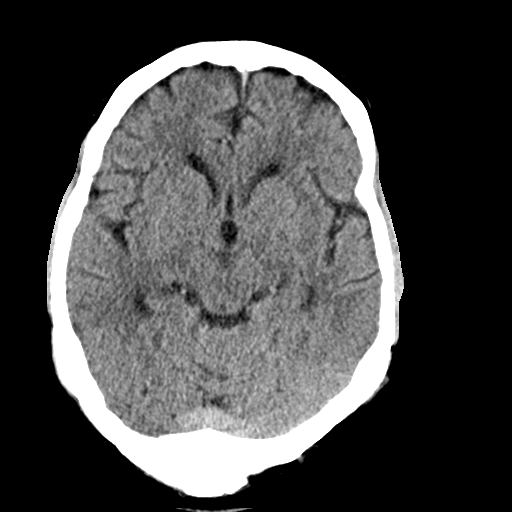
[im 15/32  brain]
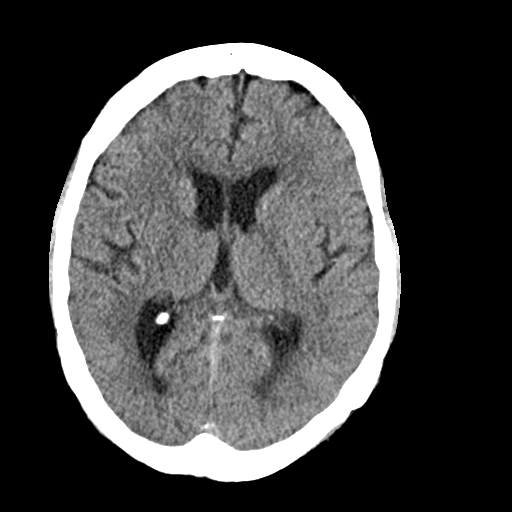
[im 15/32  bone]
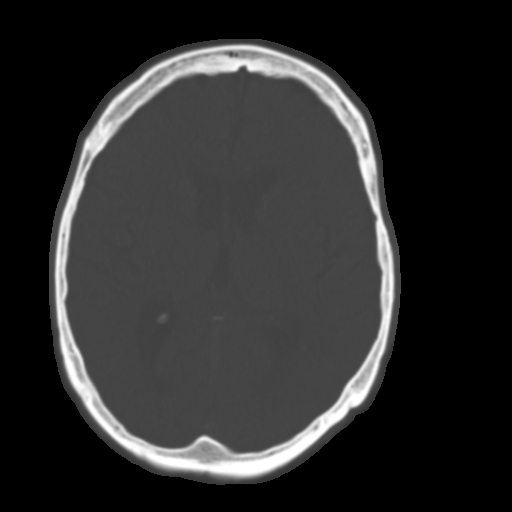
[im 17/32  brain]
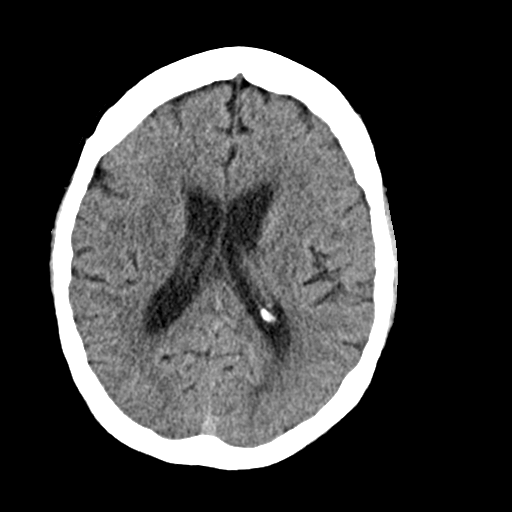
[im 20/32  brain]
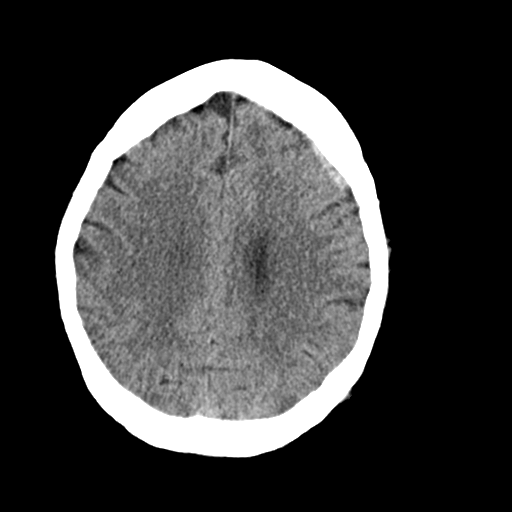
[im 23/32  brain]
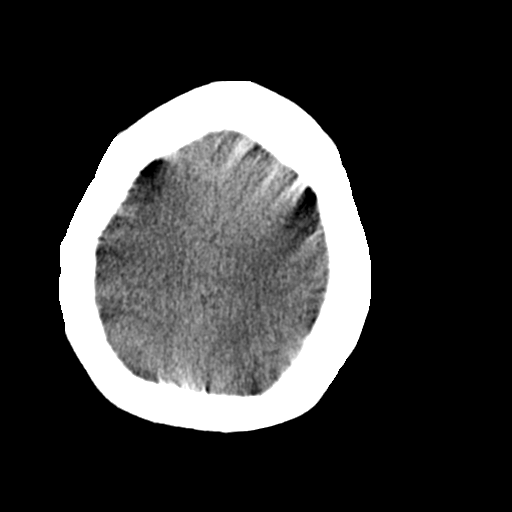
[im 27/32  brain]
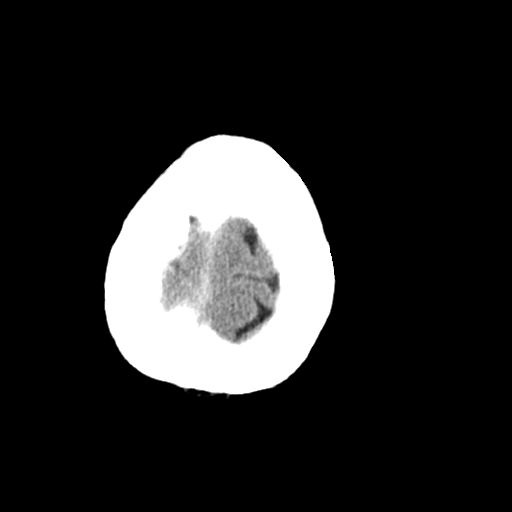
[im 27/32  bone]
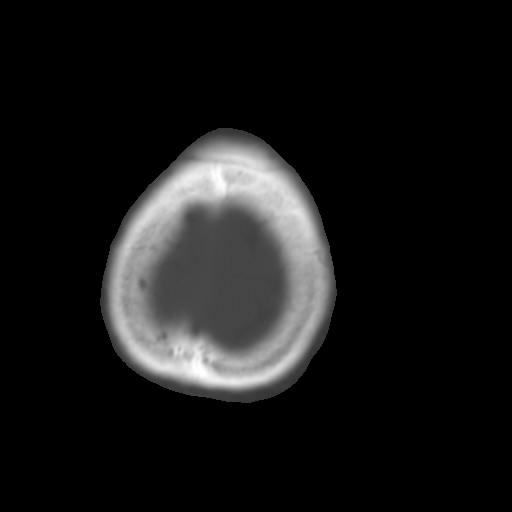
[im 30/32  brain]
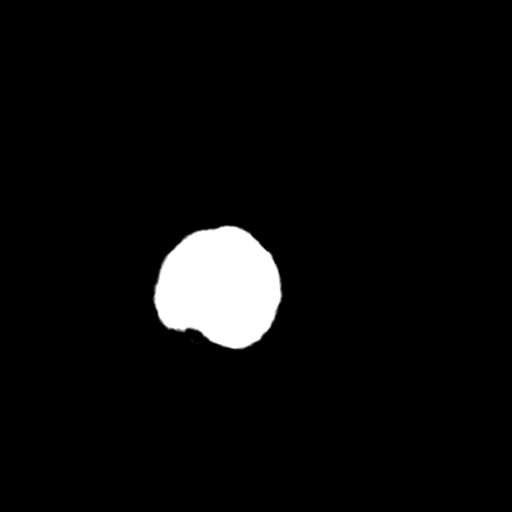

[Series 4: coronal soft · coronal · 0.33mm/px · 3 of 75 slices shown]
[im 19/75  brain]
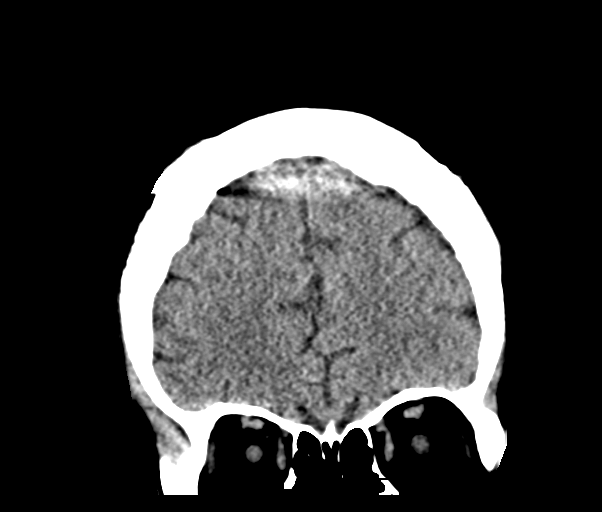
[im 38/75  brain]
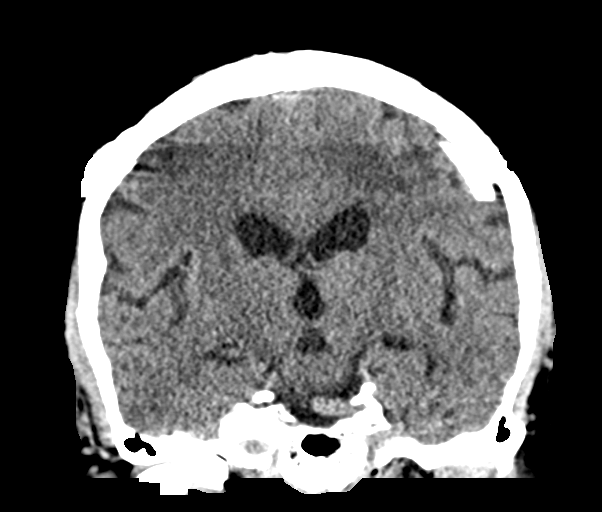
[im 56/75  brain]
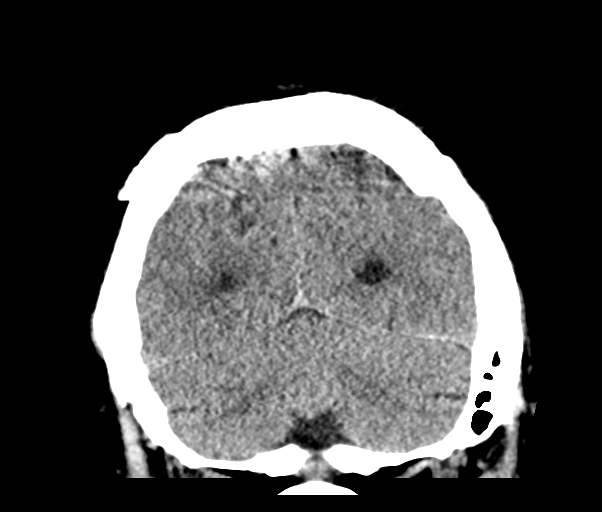

[Series 5: sag soft · sagittal · 0.31mm/px · 2 of 61 slices shown]
[im 21/61  brain]
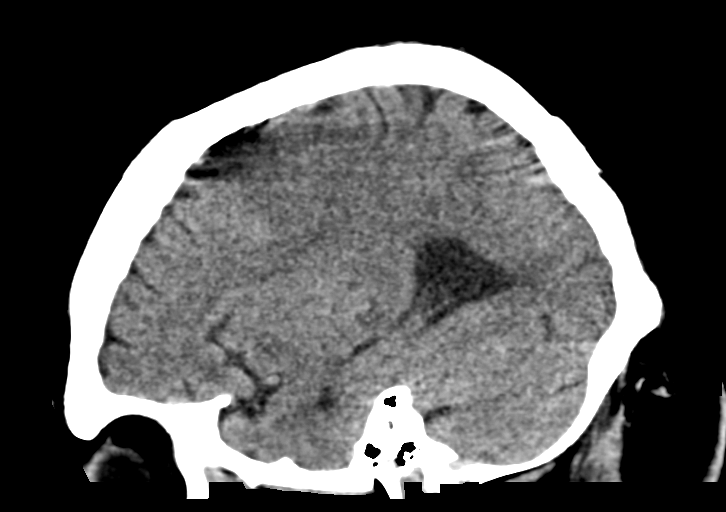
[im 41/61  brain]
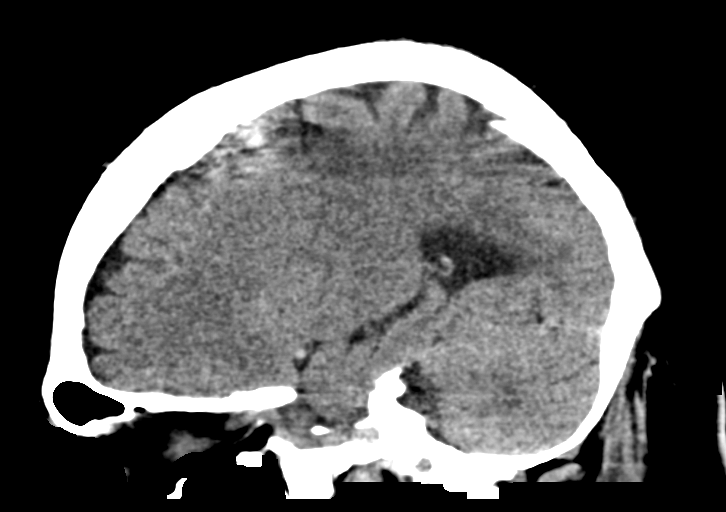

[15 of 47 positions shown; findings below may reference images not displayed]

FINDINGS: Brain: Motion artifacts extending the vertex, for which repeat
imaging was performed. Normal ventricular morphology. No midline
shift or mass effect. Normal appearance of brain parenchyma. No
intracranial hemorrhage, mass lesion, or evidence of acute
infarction. No extra-axial fluid collections.

Vascular: No hyperdense vessels

Skull: Intact

Sinuses/Orbits: Clear

Other: N/A
IMPRESSION: No acute intracranial abnormalities.

## 2019-11-04 IMAGING — CR DG HIP (WITH OR WITHOUT PELVIS) 2-3V*L*
3 series · 3 of 3 positions shown · non-contrast
Comparison: None.

CLINICAL DATA: Left hip pain, fall 12 years ago

EXAM:
DG HIP (WITH OR WITHOUT PELVIS) 2-3V LEFT

[t pelvis a.p.]
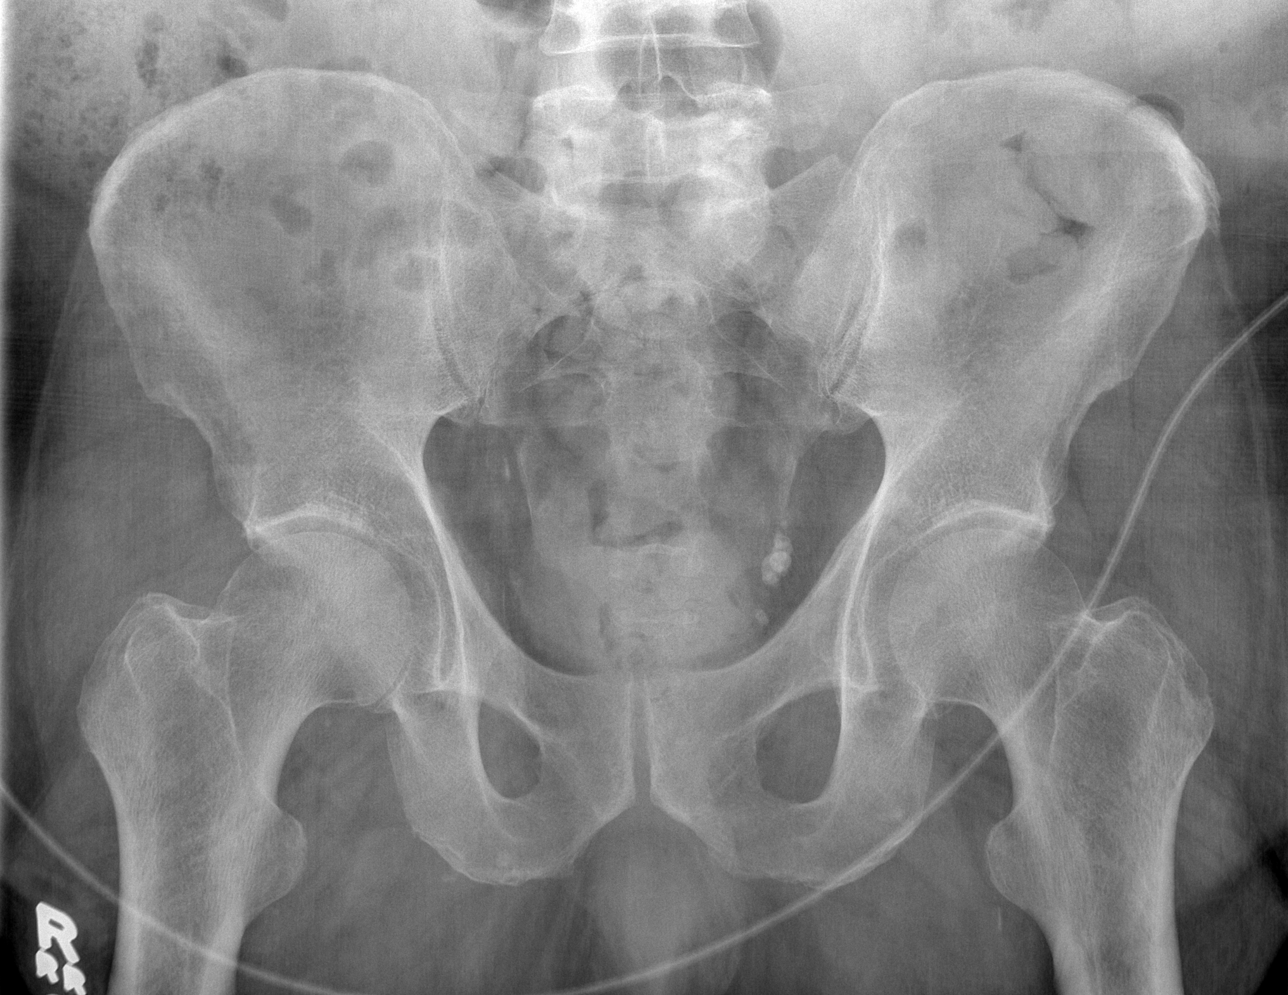

[t hip ap left]
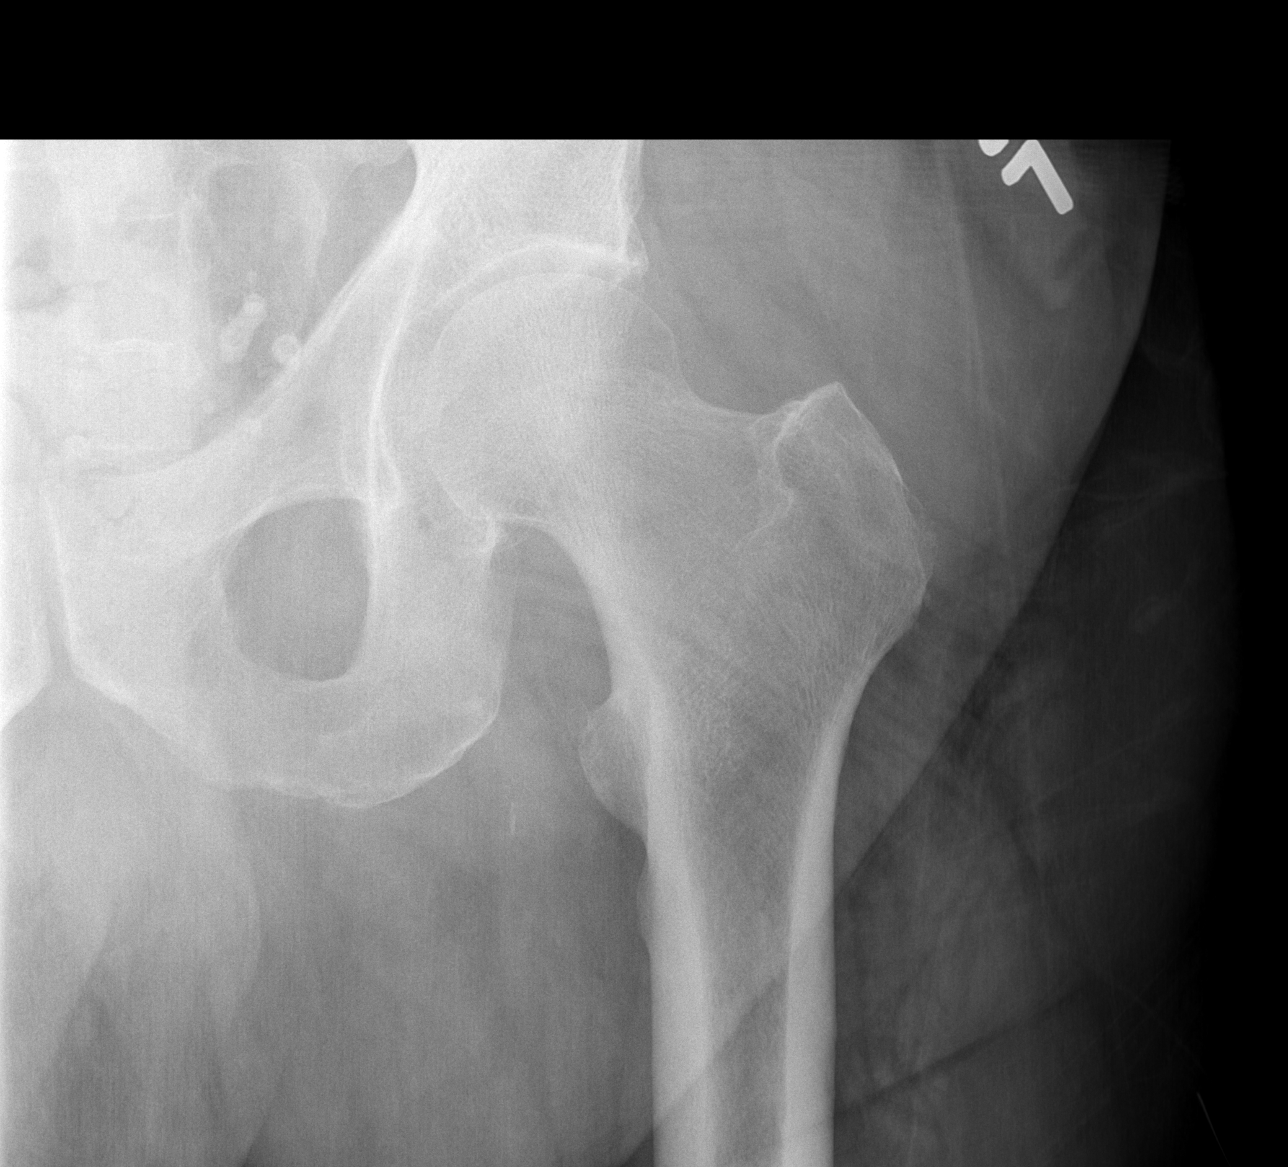

[t hip frog leg left]
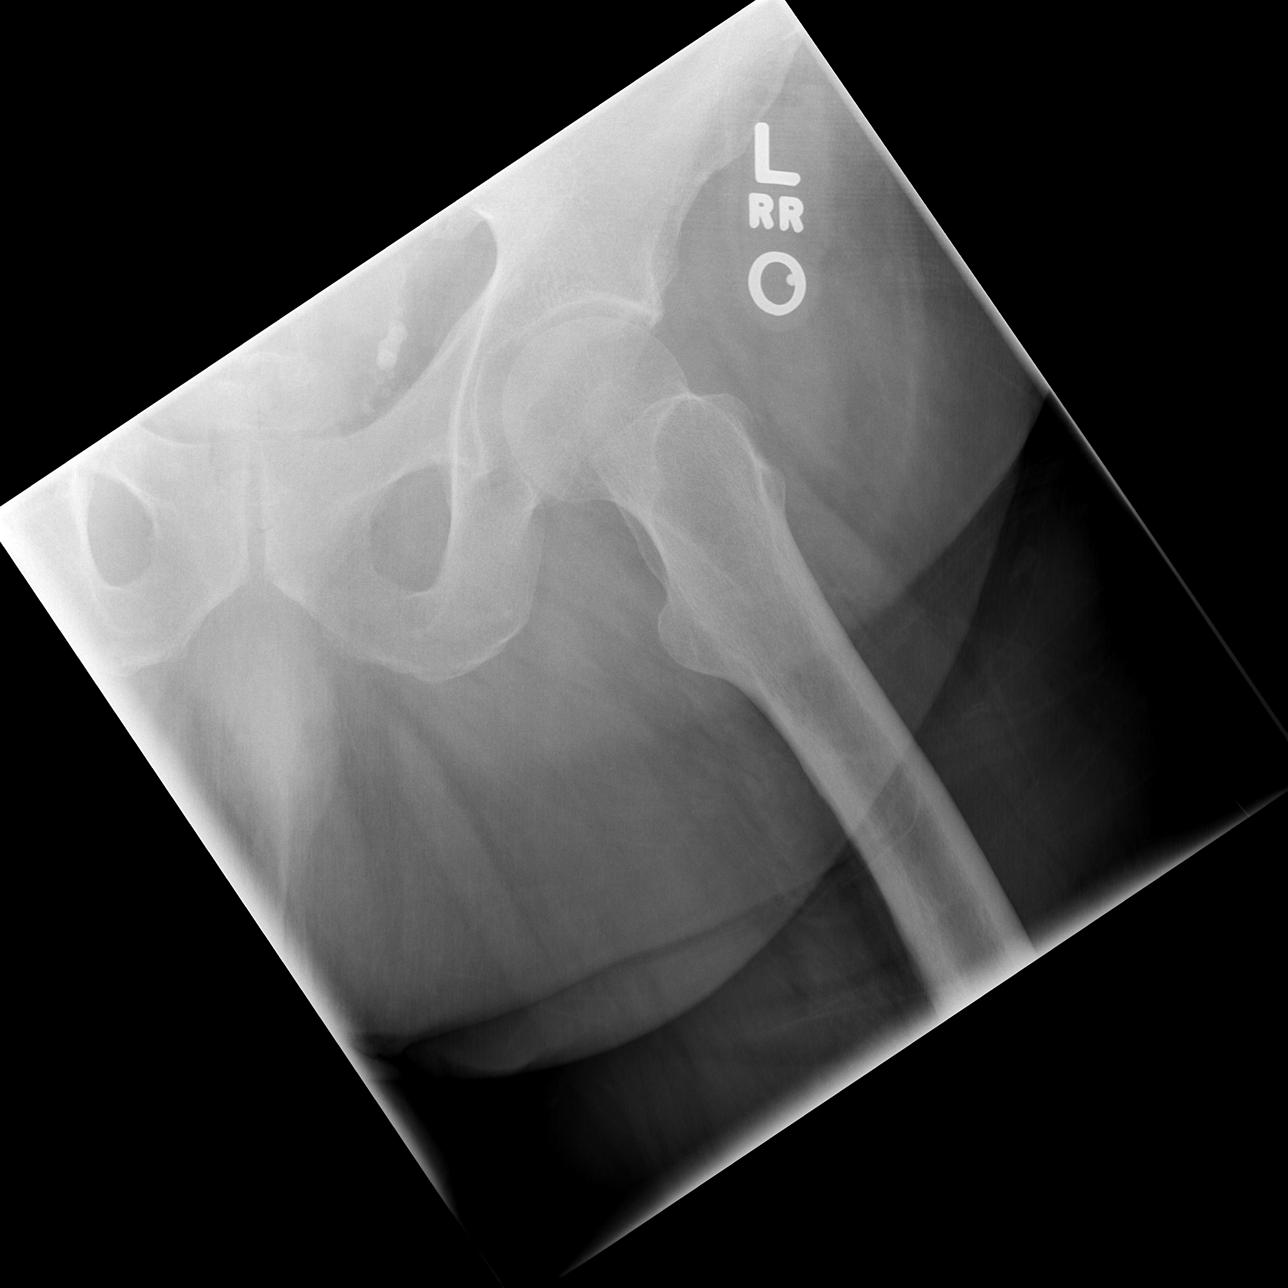

[3 of 3 positions shown; findings below may reference images not displayed]

FINDINGS: There is no evidence of hip fracture or dislocation. There is no
evidence of arthropathy or other focal bone abnormality. Phleboliths
in the pelvis.
IMPRESSION: No fracture or dislocation of the left hip. Joint spaces are
preserved.

## 2019-11-04 MED ORDER — HYDROCODONE-ACETAMINOPHEN 5-325 MG PO TABS: 1.0000 | ORAL_TABLET | Freq: Four times a day (QID) | ORAL | 0 refills | Status: DC | PRN

## 2019-11-04 MED ORDER — INSULIN ASPART 100 UNIT/ML ~~LOC~~ SOLN
10.0000 [IU] | Freq: Once | SUBCUTANEOUS | Status: AC
  Administered 2019-11-04: 11:00:00 10 [IU] via INTRAVENOUS
  Filled 2019-11-04: qty 1

## 2019-11-04 MED ORDER — SODIUM CHLORIDE 0.9 % IV BOLUS
1000.0000 mL | Freq: Once | INTRAVENOUS | Status: AC
  Administered 2019-11-04: 1000 mL via INTRAVENOUS

## 2019-11-04 NOTE — ED Notes (Signed)
ED Provider at bedside. 

## 2019-11-04 NOTE — Discharge Instructions (Addendum)
Keep an eye on your blood sugars and take this with you to your next doctor's appointment.  Return to the emergency department if you develop severe chest pain, difficulty breathing, high fever, or other new and concerning symptoms.

## 2019-11-04 NOTE — ED Triage Notes (Signed)
Feeling dizzy, weak, shortness of breath and "I can't hardly walk or standing."  Pt stated that it is getting worst for weeks.

## 2019-11-04 NOTE — ED Provider Notes (Signed)
Tyler Brewer   CSN: [id number] Arrival date & time: 11/04/19  [id number]     History Chief Complaint  Patient presents with  . Dizziness  . Weakness  . Shortness of Breath    Tyler Brewer is a 59 y.o. male.  Patient is a 59 year old male with past medical history of diabetes, coronary artery disease, hypertension, hyperlipidemia, obesity.  He presents today for evaluation of weakness.  Patient states that for the past several days he has felt weak, dizzy, short of breath, and has had difficulty walking/standing due to this.  He reports shortness of breath and tightness in his chest.  He denies fevers, chills, or productive cough.  He denies ill contacts.  The history is provided by the patient.  Weakness Severity:  Moderate Onset quality:  Gradual Timing:  Constant Progression:  Worsening Chronicity:  New Relieved by:  Nothing Worsened by:  Nothing      Past Medical History:  Diagnosis Date  . Arthritis   . CAD (coronary artery disease)  cardiac cath with moderate disease in a septal branch of the ramus intermedius 04/01/2012  . Depression   . Diabetes mellitus    poorly controlled by his report  . History of narcotic addiction ([place])    past history of back pain  . Hypercholesteremia   . Hypertension   . IBS (irritable bowel syndrome)   . Neuropathy   . Obesity    Max weight was 390  . OSA on CPAP   . Panic attacks   . Testosterone deficiency   . Vertigo     Patient Active Problem List   Diagnosis Date Noted  . Interstitial lung disease ([place]) 05/02/2017  . Pansinusitis 09/26/2016  . Diabetic polyneuropathy associated with diabetes mellitus due to underlying condition ([place]) 05/08/2016  . Methamphetamine use disorder, severe, dependence ([place]) 02/11/2016  . Substance induced mood disorder ([place]) 02/11/2016  . Exertional dyspnea 11/30/2015  . ADD (attention deficit disorder) 09/29/2015  . Binge eating 09/29/2015  .  Syncope 05/05/2012  . Diarrhea 05/05/2012  . Panic attacks 05/05/2012  . OSA (obstructive sleep apnea) 05/05/2012  . HTN (hypertension) 05/05/2012  . Diabetes mellitus, type II ([place]) 05/05/2012  . Dyslipidemia 05/05/2012  . CAD (coronary artery disease)  cardiac cath with moderate disease in a septal branch of the ramus intermedius 04/01/2012  . Chest pain 04/01/2012  . Drug abuse and dependence ([place]) 04/01/2012  . Family history of coronary artery disease 03/31/2012  . Sleep apnea, on C-pap 03/31/2012  . Hyperlipemia 03/30/2012  . HTN (hypertension), benign 03/30/2012  . Morbid obesity ([place]) 03/30/2012  . DM type 2, uncontrolled, with neuropathy ([place]) 03/30/2012  . Depression with anxiety 03/30/2012    Past Surgical History:  Procedure Laterality Date  . CARDIAC CATHETERIZATION    . LEFT HEART CATHETERIZATION WITH CORONARY ANGIOGRAM N/A 03/31/2012   Procedure: LEFT HEART CATHETERIZATION WITH CORONARY ANGIOGRAM;  Surgeon: Leonie Man, MD;  Location: [facility] CATH LAB;  Service: Cardiovascular;  Laterality: N/A;       Family History  Problem Relation Age of Onset  . Diabetes type II Father   . Hypertension Father   . Pancreatic disease Father 89       Deceased  . Healthy Mother   . Healthy Sister   . Healthy Son   . Healthy Daughter     Social History   Tobacco Use  . Smoking status: Current Every Day Smoker    Packs/day: 0.50  Types: Cigarettes  . Smokeless tobacco: Never Used  Substance Use Topics  . Alcohol use: No    Alcohol/week: 0.0 standard drinks  . Drug use: Not Currently    Types: Methamphetamines    Home Medications Prior to Admission medications   Medication Sig Start Date End Date Taking? Authorizing Provider  Insulin Lispro Prot & Lispro (HUMALOG MIX 75/25 KWIKPEN) (75-25) 100 UNIT/ML Kwikpen Inject 10 Units into the skin 2 (two) times daily. Patient taking differently: Inject 35 Units into the skin 2 (two) times daily.  05/02/17  Yes [name] R, DO  amLODipine (NORVASC) 10 MG tablet Take 1 tablet (10 mg total) by mouth daily. 05/02/17   [name], DO  gabapentin (NEURONTIN) 300 MG capsule Take 1 capsule (300 mg total) by mouth 3 (three) times daily for 14 days. 08/14/19 08/28/19  Long, Wonda Olds, MD  pravastatin (PRAVACHOL) 40 MG tablet Take 1 tablet (40 mg total) by mouth daily. 05/06/17   [name], DO    Allergies    Patient has no known allergies.  Review of Systems   Review of Systems  Neurological: Positive for weakness.  All other systems reviewed and are negative.   Physical Exam Updated Vital Signs BP (!) 191/99 (BP Location: Right Arm)   Pulse 87   Temp 98.1 F (36.7 C) (Oral)   Resp 20   SpO2 98%   Physical Exam Vitals and nursing Brewer reviewed.  Constitutional:      General: He is not in acute distress.    Appearance: He is well-developed. He is not diaphoretic.  HENT:     Head: Normocephalic and atraumatic.  Eyes:     Extraocular Movements: Extraocular movements intact.     Pupils: Pupils are equal, round, and reactive to light.  Cardiovascular:     Rate and Rhythm: Normal rate and regular rhythm.     Heart sounds: No murmur. No friction rub.  Pulmonary:     Effort: Pulmonary effort is normal. No respiratory distress.     Breath sounds: Normal breath sounds. No wheezing or rales.  Abdominal:     General: Bowel sounds are normal. There is no distension.     Palpations: Abdomen is soft.     Tenderness: There is no abdominal tenderness.  Musculoskeletal:        General: Normal range of motion.     Cervical back: Normal range of motion and neck supple.  Skin:    General: Skin is warm and dry.  Neurological:     General: No focal deficit present.     Mental Status: He is alert and oriented to person, place, and time.     Cranial Nerves: No cranial nerve deficit.     Motor: No weakness.     Coordination: Coordination normal.     ED Results / Procedures / Treatments    Labs (all labs ordered are listed, but only abnormal results are displayed) Labs Reviewed  COMPREHENSIVE METABOLIC PANEL  CBC WITH DIFFERENTIAL/PLATELET  URINALYSIS, ROUTINE W REFLEX MICROSCOPIC  TSH  TROPONIN I (HIGH SENSITIVITY)    EKG EKG Interpretation  Date/Time:  Wednesday November 04 2019 10:54:33 EDT Ventricular Rate:  85 PR Interval:    QRS Duration: 90 QT Interval:  383 QTC Calculation: 456 R Axis:   -22 Text Interpretation: Sinus rhythm Borderline left axis deviation Minimal ST depression, inferior leads Confirmed by Veryl Speak [phone]) on 11/04/2019 11:03:12 AM   Radiology No results  found.  Procedures Procedures (including critical care time)  Medications Ordered in ED Medications  sodium chloride 0.9 % bolus 1,000 mL (has no administration in time range)    ED Course  I have reviewed the triage vital signs and the nursing notes.  Pertinent labs & imaging results that were available during my care of the patient were reviewed by me and considered in my medical decision making (see chart for details).    MDM Rules/Calculators/A&P  Patient's work-up shows elevated blood glucose, but no other obvious abnormality including CT scan of the head, remainder of laboratory studies, EKG, troponin, and x-rays of the chest and pelvis.  Patient was given IV insulin and fluids with significant improvement of his blood sugars.  EKG is unchanged and troponin is negative with symptoms present for several days.  At this point, I feel as though patient is appropriate for discharge.  I have advised him to keep a record of his blood sugars and follow-up with his primary doctor in the next few days.  Final Clinical Impression(s) / ED Diagnoses Final diagnoses:  None    Rx / DC Orders ED Discharge Orders    None       Veryl Speak, MD 11/04/19 1425

## 2020-01-07 ENCOUNTER — Encounter (HOSPITAL_BASED_OUTPATIENT_CLINIC_OR_DEPARTMENT_OTHER): Payer: Self-pay | Admitting: *Deleted

## 2020-01-07 ENCOUNTER — Other Ambulatory Visit: Payer: Self-pay

## 2020-01-07 ENCOUNTER — Emergency Department (HOSPITAL_BASED_OUTPATIENT_CLINIC_OR_DEPARTMENT_OTHER)
Admission: EM | Admit: 2020-01-07 | Discharge: 2020-01-08 | Disposition: A | Discharge status: Home / Self Care | Payer: Medicaid Other | Attending: Emergency Medicine | Admitting: Emergency Medicine

## 2020-01-07 DIAGNOSIS — N50812 Left testicular pain: Secondary | ICD-10-CM | POA: Insufficient documentation

## 2020-01-07 DIAGNOSIS — J020 Streptococcal pharyngitis: Secondary | ICD-10-CM

## 2020-01-07 DIAGNOSIS — Z794 Long term (current) use of insulin: Secondary | ICD-10-CM | POA: Insufficient documentation

## 2020-01-07 DIAGNOSIS — I2510 Atherosclerotic heart disease of native coronary artery without angina pectoris: Secondary | ICD-10-CM | POA: Insufficient documentation

## 2020-01-07 DIAGNOSIS — F17210 Nicotine dependence, cigarettes, uncomplicated: Secondary | ICD-10-CM | POA: Insufficient documentation

## 2020-01-07 DIAGNOSIS — Z79899 Other long term (current) drug therapy: Secondary | ICD-10-CM | POA: Insufficient documentation

## 2020-01-07 DIAGNOSIS — Z886 Allergy status to analgesic agent status: Secondary | ICD-10-CM | POA: Insufficient documentation

## 2020-01-07 DIAGNOSIS — I10 Essential (primary) hypertension: Secondary | ICD-10-CM | POA: Insufficient documentation

## 2020-01-07 DIAGNOSIS — E782 Mixed hyperlipidemia: Secondary | ICD-10-CM | POA: Insufficient documentation

## 2020-01-07 DIAGNOSIS — E119 Type 2 diabetes mellitus without complications: Secondary | ICD-10-CM | POA: Insufficient documentation

## 2020-01-07 DIAGNOSIS — Z9861 Coronary angioplasty status: Secondary | ICD-10-CM | POA: Insufficient documentation

## 2020-01-07 NOTE — ED Triage Notes (Signed)
C/o sore throat x 2 days , no resp distress noted in triage , pt c/o swelling to left testicle x 1 week

## 2020-01-08 ENCOUNTER — Ambulatory Visit (HOSPITAL_BASED_OUTPATIENT_CLINIC_OR_DEPARTMENT_OTHER): Admit: 2020-01-08 | Payer: Self-pay

## 2020-01-08 LAB — URINALYSIS, MICROSCOPIC (REFLEX): WBC, UA: NONE SEEN WBC/hpf (ref 0–5)

## 2020-01-08 LAB — URINE CULTURE: Culture: <10000 — AB

## 2020-01-08 LAB — URINALYSIS, ROUTINE W REFLEX MICROSCOPIC
Bilirubin Urine: NEGATIVE
Glucose, UA: >=500 mg/dL — AB
Hgb urine dipstick: NEGATIVE
Ketones, ur: NEGATIVE mg/dL
Leukocytes,Ua: NEGATIVE
Nitrite: NEGATIVE
Protein, ur: NEGATIVE mg/dL
Specific Gravity, Urine: <1.005 — ABNORMAL LOW (ref 1.005–1.030)
pH: 5.5 (ref 5.0–8.0)

## 2020-01-08 LAB — GC/CHLAMYDIA PROBE AMP (~~LOC~~) NOT AT ARMC
Chlamydia: NEGATIVE
Comment: NEGATIVE
Comment: NORMAL
Neisseria Gonorrhea: NEGATIVE

## 2020-01-08 LAB — GROUP A STREP BY PCR: Group A Strep by PCR: DETECTED — AB

## 2020-01-08 MED ORDER — PENICILLIN G BENZATHINE 1200000 UNIT/2ML IM SUSP
1.2000 10*6.[IU] | Freq: Once | INTRAMUSCULAR | Status: AC
  Administered 2020-01-08: 1.2 10*6.[IU] via INTRAMUSCULAR
  Filled 2020-01-08: qty 2

## 2020-01-08 NOTE — ED Provider Notes (Addendum)
[facility] Provider Note: Georgena Spurling, MD, FACEP  CSN: [id number] MRN: [id number] ARRIVAL: 01/07/20 at 2310 ROOM: [place]  Sore Throat   HISTORY OF PRESENT ILLNESS  01/08/20 12:13 AM Tyler Brewer is a 59 y.o. male with a 2-day history of sore throat. This pain is moderate and it hurts to swallow. He has not had a fever with this. He denies any difficulty breathing. He also complains of pain in his left testicle for a week about a week. This pain is also moderate and sharp in nature. It radiates proximally to his inguinal region. He has not noticed blood in his urine but has noticed a different sensation when he urinates which he does not describe as pain or discomfort.   Past Medical History:  Diagnosis Date  . Arthritis   . CAD (coronary artery disease)  cardiac cath with moderate disease in a septal branch of the ramus intermedius 04/01/2012  . Depression   . Diabetes mellitus    poorly controlled by his report  . History of narcotic addiction ([place])    past history of back pain  . Hypercholesteremia   . Hypertension   . IBS (irritable bowel syndrome)   . 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[place])   . Neuropathy   . Obesity    Max weight was 390  . OSA on CPAP   . Panic attacks   . Testosterone deficiency   . Vertigo     Past Surgical History:  Procedure Laterality Date  . CARDIAC CATHETERIZATION    . LEFT HEART CATHETERIZATION WITH CORONARY ANGIOGRAM N/A 03/31/2012   Procedure: LEFT HEART CATHETERIZATION WITH CORONARY ANGIOGRAM;  Surgeon: Leonie Man, MD;  Location: [facility] CATH LAB;  Service: Cardiovascular;  Laterality: N/A;    Family History  Problem Relation Age of Onset  . Diabetes type II Father   . Hypertension Father   . Pancreatic disease Father 77       Deceased  . Healthy Mother   . Healthy Sister   . Healthy Son   . Healthy Daughter     Social History   Tobacco Use  . Smoking status: Current Every Day Smoker     Packs/day: 0.50    Types: Cigarettes  . Smokeless tobacco: Never Used  Substance Use Topics  . Alcohol use: No    Alcohol/week: 0.0 standard drinks  . Drug use: Not Currently    Types: Methamphetamines    Prior to Admission medications   Medication Sig Start Date End Date Taking? Authorizing Provider  amLODipine (NORVASC) 10 MG tablet Take 1 tablet (10 mg total) by mouth daily. 05/02/17   [name], DO  gabapentin (NEURONTIN) 300 MG capsule Take 1 capsule (300 mg total) by mouth 3 (three) times daily for 14 days. 08/14/19 08/28/19  Long, Wonda Olds, MD  HYDROcodone-acetaminophen (NORCO) 5-325 MG tablet Take 1-2 tablets by mouth every 6 (six) hours as needed. 11/04/19   Veryl Speak, MD  Insulin Lispro Prot & Lispro (HUMALOG MIX 75/25 KWIKPEN) (75-25) 100 UNIT/ML Kwikpen Inject 10 Units into the skin 2 (two) times daily. Patient taking differently: Inject 35 Units into the skin 2 (two) times daily.  05/02/17   [name], DO  pravastatin (PRAVACHOL) 40 MG tablet Take 1 tablet (40 mg total) by mouth daily. 05/06/17   [name], DO    Allergies Gabapentin   REVIEW OF SYSTEMS  Negative except as noted here or in the  History of Present Illness.   PHYSICAL EXAMINATION  Initial Vital Signs Blood pressure (!) 141/100, pulse (!) 101, temperature 98.7 F (37.1 C), temperature source Oral, resp. rate 16, height 5\' 11"  (1.803 m), weight 131.5 kg, SpO2 98 %.  Examination General: Well-developed, well-nourished male in no acute distress; appearance consistent with age of record HENT: normocephalic; atraumatic; pharyngeal erythema and uvular edema without exudate; no dysphonia; no trismus; no stridor Eyes: pupils equal, round and reactive to light; extraocular muscles intact Neck: supple; mild anterior cervical lymphadenopathy Heart: regular rate and rhythm; no murmurs, rubs or gallops Lungs: clear to auscultation bilaterally Abdomen: soft; nondistended;  nontender; bowel sounds present GU: Tanner V male, uncircumcised; no urethral discharge; left epididymal tenderness without palpable mass or enlargement Extremities: No deformity; full range of motion; pulses normal Neurologic: Awake, alert and oriented; motor function intact in all extremities and symmetric; no facial droop Skin: Warm and dry Psychiatric: Normal mood and affect   RESULTS  Summary of this visit's results, reviewed and interpreted by myself:   EKG Interpretation  Date/Time:    Ventricular Rate:    PR Interval:    QRS Duration:   QT Interval:    QTC Calculation:   R Axis:     Text Interpretation:        Laboratory Studies: Results for orders placed or performed during the hospital encounter of 01/07/20 (from the past 24 hour(s))  Urinalysis, Routine w reflex microscopic     Status: Abnormal   Collection Time: 01/08/20 12:05 AM  Result Value Ref Range   Color, Urine YELLOW YELLOW   APPearance CLEAR CLEAR   Specific Gravity, Urine <1.005 (L) 1.005 - 1.030   pH 5.5 5.0 - 8.0   Glucose, UA >=500 (A) NEGATIVE mg/dL   Hgb urine dipstick NEGATIVE NEGATIVE   Bilirubin Urine NEGATIVE NEGATIVE   Ketones, ur NEGATIVE NEGATIVE mg/dL   Protein, ur NEGATIVE NEGATIVE mg/dL   Nitrite NEGATIVE NEGATIVE   Leukocytes,Ua NEGATIVE NEGATIVE  Urinalysis, Microscopic (reflex)     Status: Abnormal   Collection Time: 01/08/20 12:05 AM  Result Value Ref Range   RBC / HPF 0-5 0 - 5 RBC/hpf   WBC, UA NONE SEEN 0 - 5 WBC/hpf   Bacteria, UA RARE (A) NONE SEEN   Squamous Epithelial / LPF 0-5 0 - 5  Group A Strep by PCR     Status: Abnormal   Collection Time: 01/08/20 12:23 AM   Specimen: Throat; Sterile Swab  Result Value Ref Range   Group A Strep by PCR DETECTED (A) NOT DETECTED   Imaging Studies: No results found.  ED COURSE and MDM  Nursing notes, initial and subsequent vitals signs, including pulse oximetry, reviewed and interpreted by myself.  Vitals:   01/07/20 2319  01/07/20 2320  BP: (!) 141/100   Pulse: (!) 101   Resp: 16   Temp: 98.7 F (37.1 C)   TempSrc: Oral   SpO2: 98%   Weight:  131.5 kg  Height:  5\' 11"  (1.803 m)   Medications  penicillin g benzathine (BICILLIN LA) 1200000 UNIT/2ML injection 1.2 Million Units (has no administration in time range)    Patient would prefer to be treated with a single shot of Bicillin LA.  We will have him return later today for scrotal ultrasound.  There was no obvious epididymal enlargement palpated but epididymitis is a possibility.  PROCEDURES  Procedures   ED DIAGNOSES     ICD-10-CM   1. Strep throat  J02.0  2. Testicular pain, left  N50.812        Aviance [place], Jenny Reichmann, MD 01/08/20 0118    Shanon Rosser, MD 04/14/20 [phone]

## 2020-01-09 ENCOUNTER — Inpatient Hospital Stay (HOSPITAL_BASED_OUTPATIENT_CLINIC_OR_DEPARTMENT_OTHER): Admission: RE | Admit: 2020-01-09 | Payer: Self-pay | Source: Ambulatory Visit

## 2020-01-21 ENCOUNTER — Other Ambulatory Visit: Payer: Self-pay

## 2020-01-21 ENCOUNTER — Emergency Department (HOSPITAL_BASED_OUTPATIENT_CLINIC_OR_DEPARTMENT_OTHER): Payer: Medicaid Other

## 2020-01-21 ENCOUNTER — Encounter (HOSPITAL_BASED_OUTPATIENT_CLINIC_OR_DEPARTMENT_OTHER): Payer: Self-pay | Admitting: Emergency Medicine

## 2020-01-21 ENCOUNTER — Emergency Department (HOSPITAL_BASED_OUTPATIENT_CLINIC_OR_DEPARTMENT_OTHER)
Admission: EM | Admit: 2020-01-21 | Discharge: 2020-01-21 | Disposition: A | Discharge status: Home / Self Care | Payer: Medicaid Other | Attending: Emergency Medicine | Admitting: Emergency Medicine

## 2020-01-21 DIAGNOSIS — E1142 Type 2 diabetes mellitus with diabetic polyneuropathy: Secondary | ICD-10-CM | POA: Insufficient documentation

## 2020-01-21 DIAGNOSIS — I2510 Atherosclerotic heart disease of native coronary artery without angina pectoris: Secondary | ICD-10-CM | POA: Insufficient documentation

## 2020-01-21 DIAGNOSIS — I10 Essential (primary) hypertension: Secondary | ICD-10-CM | POA: Insufficient documentation

## 2020-01-21 DIAGNOSIS — Z20822 Contact with and (suspected) exposure to covid-19: Secondary | ICD-10-CM | POA: Insufficient documentation

## 2020-01-21 DIAGNOSIS — E119 Type 2 diabetes mellitus without complications: Secondary | ICD-10-CM | POA: Insufficient documentation

## 2020-01-21 DIAGNOSIS — Z79899 Other long term (current) drug therapy: Secondary | ICD-10-CM | POA: Insufficient documentation

## 2020-01-21 DIAGNOSIS — B354 Tinea corporis: Secondary | ICD-10-CM | POA: Insufficient documentation

## 2020-01-21 DIAGNOSIS — F17210 Nicotine dependence, cigarettes, uncomplicated: Secondary | ICD-10-CM | POA: Insufficient documentation

## 2020-01-21 DIAGNOSIS — L03314 Cellulitis of groin: Secondary | ICD-10-CM

## 2020-01-21 LAB — URINALYSIS, ROUTINE W REFLEX MICROSCOPIC
Bilirubin Urine: NEGATIVE
Glucose, UA: >=500 mg/dL — AB
Hgb urine dipstick: NEGATIVE
Ketones, ur: NEGATIVE mg/dL
Leukocytes,Ua: NEGATIVE
Nitrite: NEGATIVE
Protein, ur: NEGATIVE mg/dL
Specific Gravity, Urine: 1.01 (ref 1.005–1.030)
pH: 6 (ref 5.0–8.0)

## 2020-01-21 LAB — CBC WITH DIFFERENTIAL/PLATELET
Abs Immature Granulocytes: 0.03 10*3/uL (ref 0.00–0.07)
Basophils Absolute: 0.1 10*3/uL (ref 0.0–0.1)
Basophils Relative: 1 %
Eosinophils Absolute: 0.1 10*3/uL (ref 0.0–0.5)
Eosinophils Relative: 2 %
HCT: 47.7 % (ref 39.0–52.0)
Hemoglobin: 16.8 g/dL (ref 13.0–17.0)
Immature Granulocytes: 0 %
Lymphocytes Relative: 34 %
Lymphs Abs: 2.4 10*3/uL (ref 0.7–4.0)
MCH: 28.9 pg (ref 26.0–34.0)
MCHC: 35.2 g/dL (ref 30.0–36.0)
MCV: 82.1 fL (ref 80.0–100.0)
Monocytes Absolute: 0.7 10*3/uL (ref 0.1–1.0)
Monocytes Relative: 10 %
Neutro Abs: 3.8 10*3/uL (ref 1.7–7.7)
Neutrophils Relative %: 53 %
Platelets: 312 10*3/uL (ref 150–400)
RBC: 5.81 MIL/uL (ref 4.22–5.81)
RDW: 12.6 % (ref 11.5–15.5)
WBC: 7.1 10*3/uL (ref 4.0–10.5)
nRBC: 0 % (ref 0.0–0.2)

## 2020-01-21 LAB — URINALYSIS, MICROSCOPIC (REFLEX)

## 2020-01-21 LAB — COMPREHENSIVE METABOLIC PANEL
ALT: 24 U/L (ref 0–44)
AST: 14 U/L — ABNORMAL LOW (ref 15–41)
Albumin: 3.5 g/dL (ref 3.5–5.0)
Alkaline Phosphatase: 90 U/L (ref 38–126)
Anion gap: 11 (ref 5–15)
BUN: 20 mg/dL (ref 6–20)
CO2: 21 mmol/L — ABNORMAL LOW (ref 22–32)
Calcium: 9.3 mg/dL (ref 8.9–10.3)
Chloride: 101 mmol/L (ref 98–111)
Creatinine, Ser: 0.93 mg/dL (ref 0.61–1.24)
GFR calc Af Amer: >60 mL/min (ref >60)
GFR calc non Af Amer: >60 mL/min (ref >60)
Glucose, Bld: 220 mg/dL — ABNORMAL HIGH (ref 70–99)
Potassium: 3.8 mmol/L (ref 3.5–5.1)
Sodium: 133 mmol/L — ABNORMAL LOW (ref 135–145)
Total Bilirubin: 0.7 mg/dL (ref 0.3–1.2)
Total Protein: 7.2 g/dL (ref 6.5–8.1)

## 2020-01-21 LAB — SARS CORONAVIRUS 2 BY RT PCR (HOSPITAL ORDER, PERFORMED IN ~~LOC~~ HOSPITAL LAB): SARS Coronavirus 2: NEGATIVE

## 2020-01-21 LAB — LACTIC ACID, PLASMA: Lactic Acid, Venous: 1.3 mmol/L (ref 0.5–1.9)

## 2020-01-21 LAB — LIPASE, BLOOD: Lipase: 29 U/L (ref 11–51)

## 2020-01-21 IMAGING — US US SCROTUM W/ DOPPLER COMPLETE
1 series · 14 of 25 positions shown · non-contrast
Comparison: [DATE].

CLINICAL DATA: Left testicular pain. Left groin and abdominal pain.

EXAM:
SCROTAL ULTRASOUND
DOPPLER ULTRASOUND OF THE TESTICLES
TECHNIQUE: Complete ultrasound examination of the testicles, epididymis, and
other scrotal structures was performed. Color and spectral Doppler
ultrasound were also utilized to evaluate blood flow to the
testicles.

[Series 1: us scrotum w/ doppler complete · 14 of 40 slices shown]
[im 1/40]
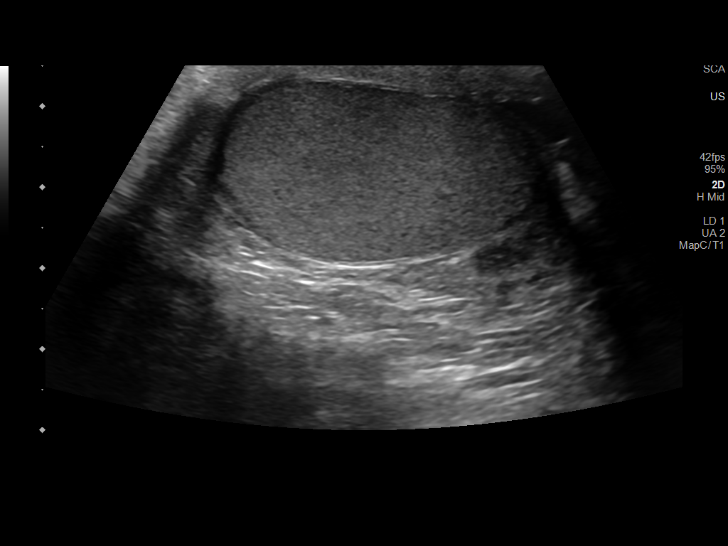
[im 4/40]
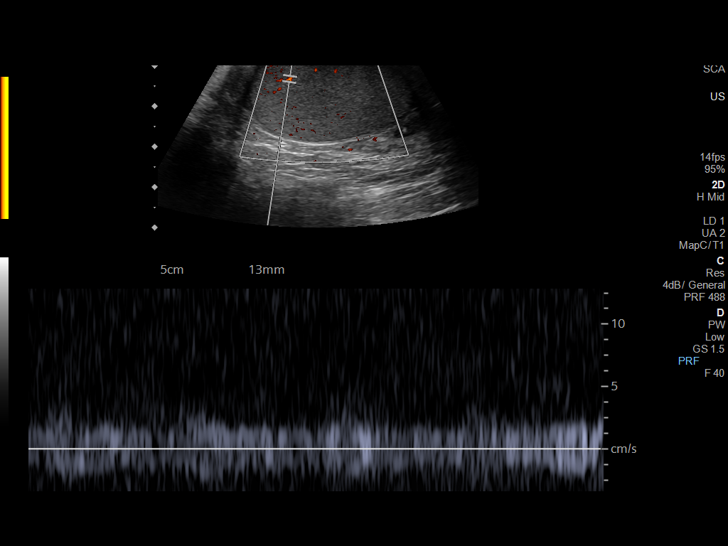
[im 7/40]
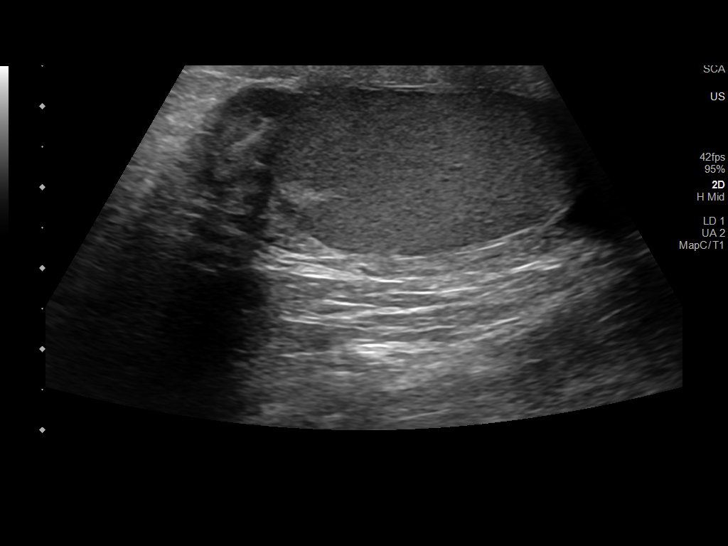
[im 10/40]
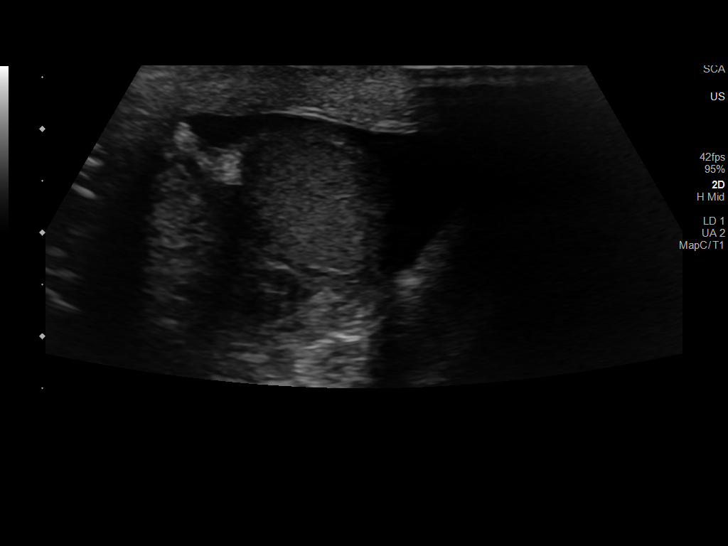
[im 14/40]
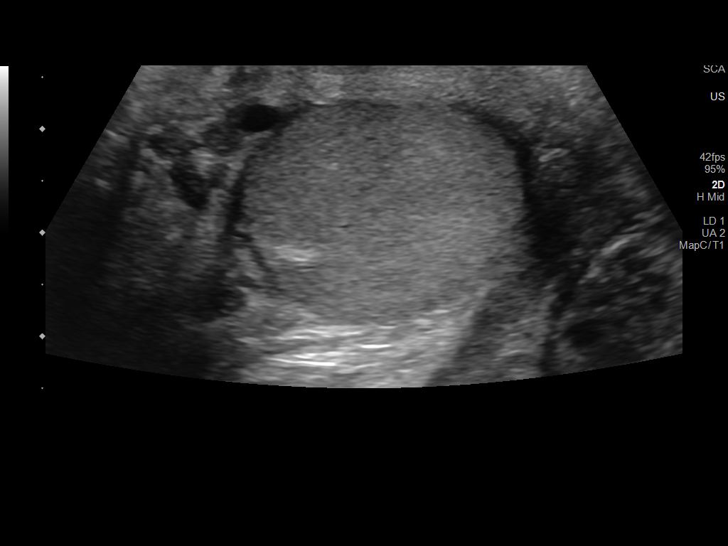
[im 15/40]
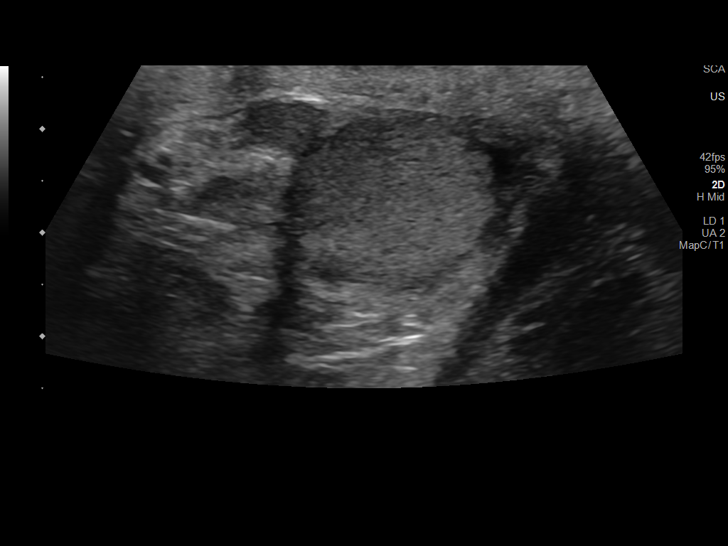
[im 18/40]
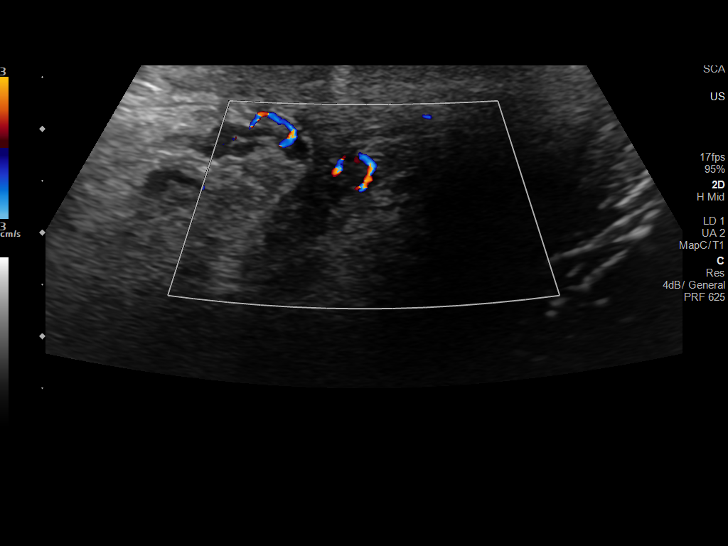
[im 22/40]
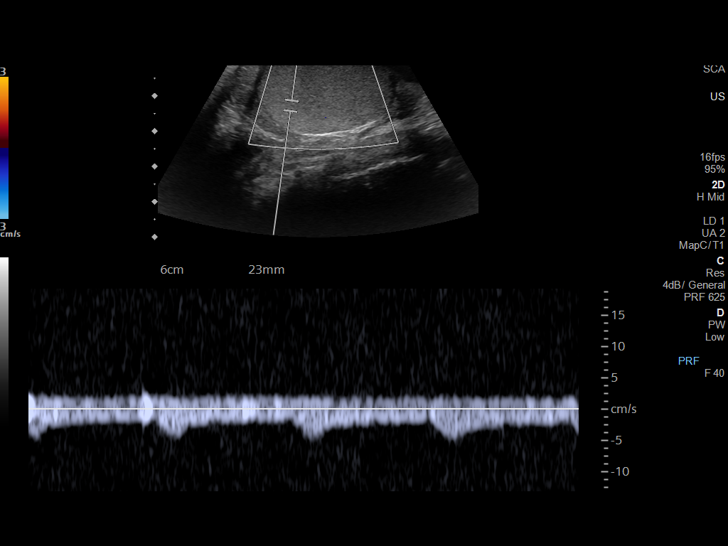
[im 25/40]
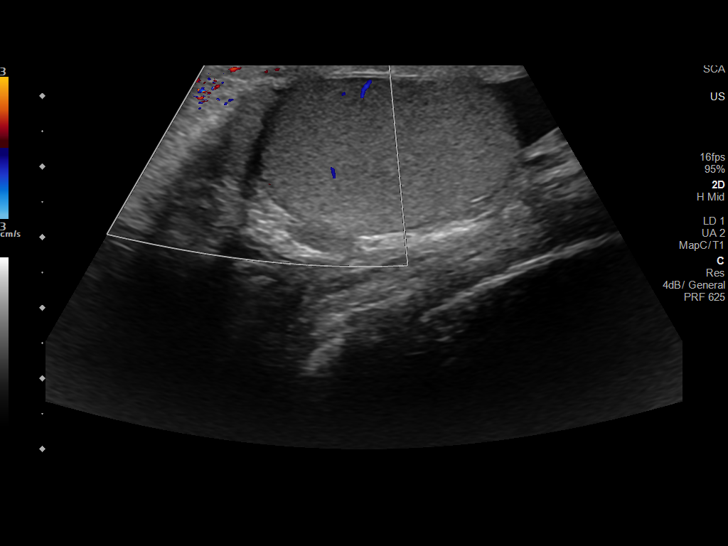
[im 27/40]
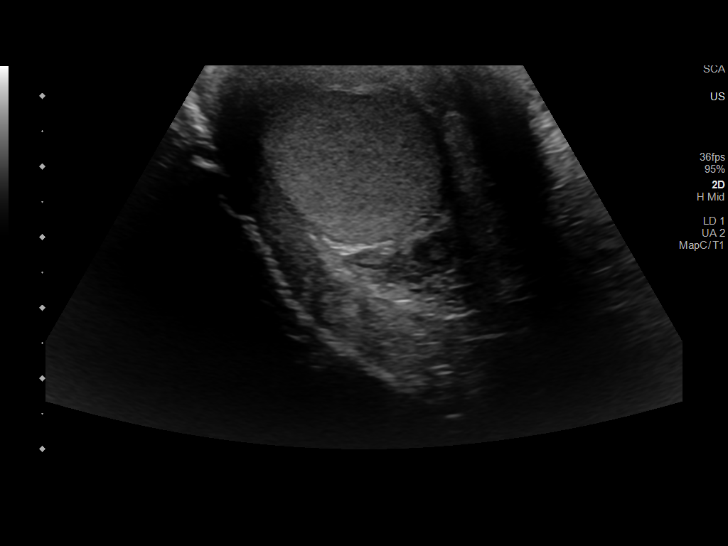
[im 30/40]
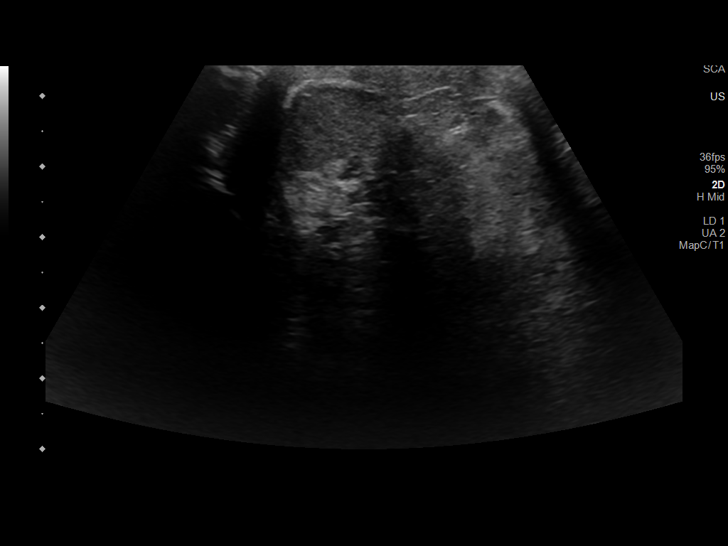
[im 33/40]
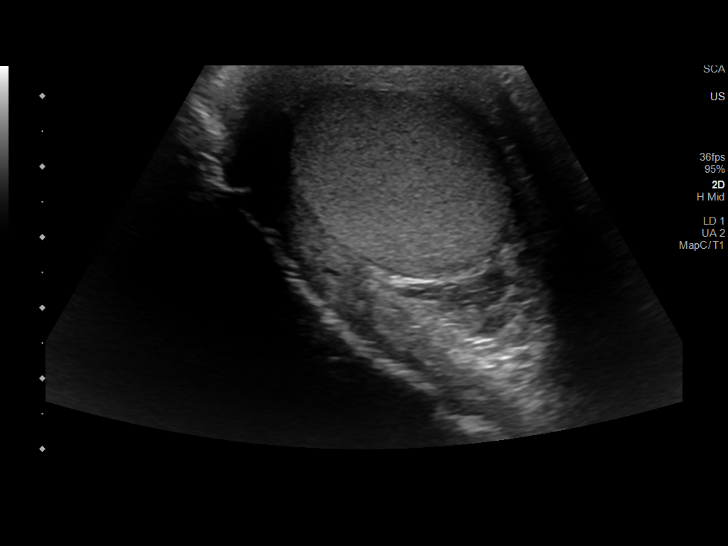
[im 36/40]
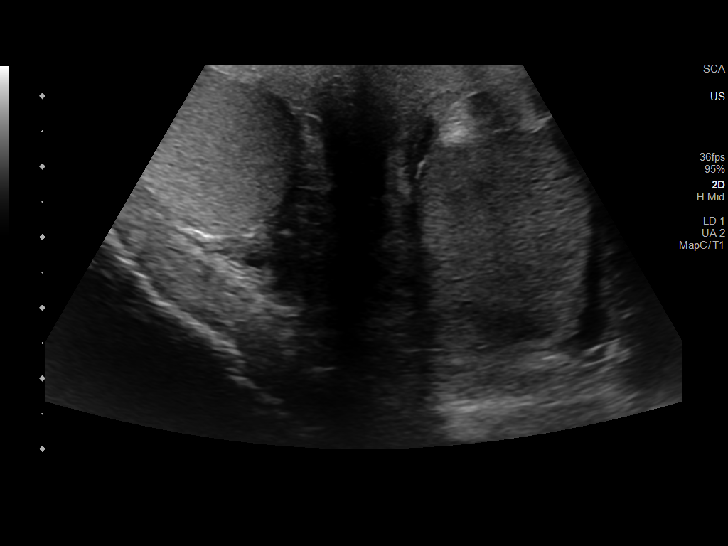
[im 40/40]
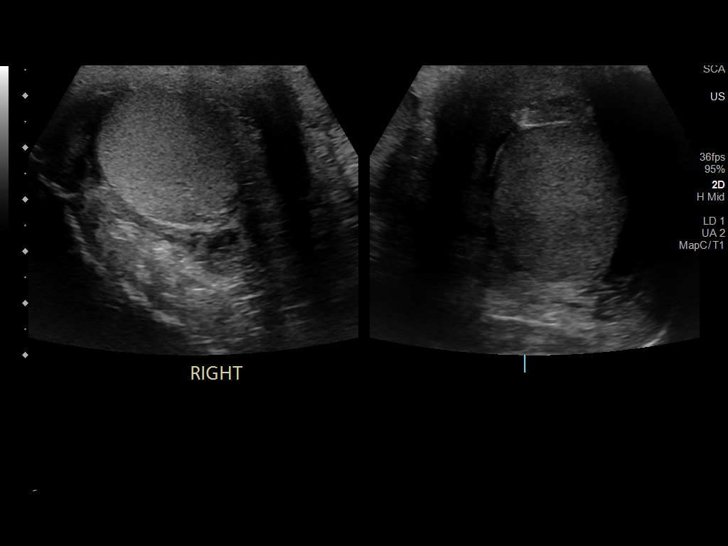

[14 of 25 positions shown; findings below may reference images not displayed]

FINDINGS: Right testicle

Measurements: 3.9 x 2.2 x 2.4 cm. No mass or microlithiasis
visualized.

Left testicle

Measurements: 3.7 x 2.3 x 2.8 cm. No mass or microlithiasis
visualized. Scrotal skin thickening.

Right epididymis:  Normal in size and appearance.

Left epididymis: The left epididymis is prominent suggesting
epididymitis.

Hydrocele: Small bilateral

Varicocele: None
IMPRESSION: 1. Left scrotal skin thickening suggesting cellulitis. 2. Left
epididymis is prominent suggesting epididymitis. No evidence of
testicular mass or torsion.

## 2020-01-21 IMAGING — CT CT ABD-PELV W/ CM
2 of 8 series · 15 of 46 positions shown, 17 images · IV contrast (Omnipaque)
Comparison: Scrotal ultrasound of [DATE] and CT abdomen from
[DATE]

CLINICAL DATA: Lower abdominal pain extending into the groin for 3
days. Scrotal soft tissue infection. Query CT findings of CHING
gangrene.

EXAM:
CT ABDOMEN AND PELVIS WITH CONTRAST
TECHNIQUE: Multidetector CT imaging of the abdomen and pelvis was performed
using the standard protocol following bolus administration of
intravenous contrast.
CONTRAST:  100mL OMNIPAQUE IOHEXOL 300 MG/ML  SOLN

[Series 2: axial st · axial · 0.97mm/px · z∈[-633,-43]mm · 12 of 134 slices shown, 14 images]
[im 8/134  soft-tissue]
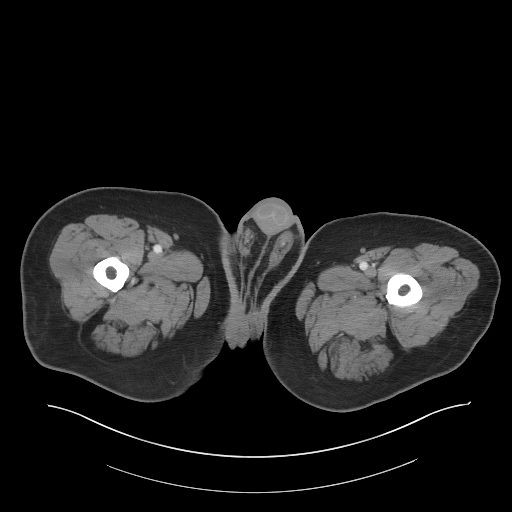
[im 8/134  bone]
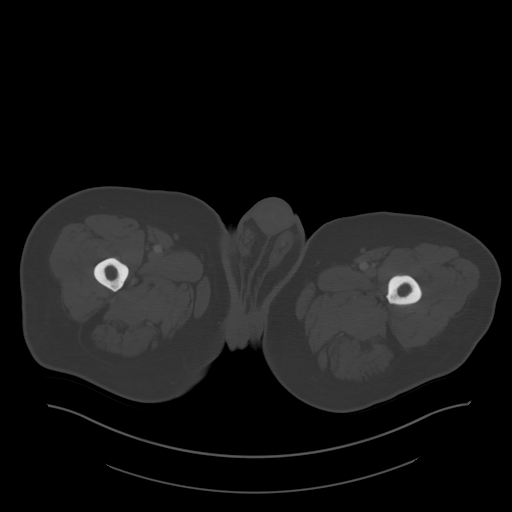
[im 23/134  soft-tissue]
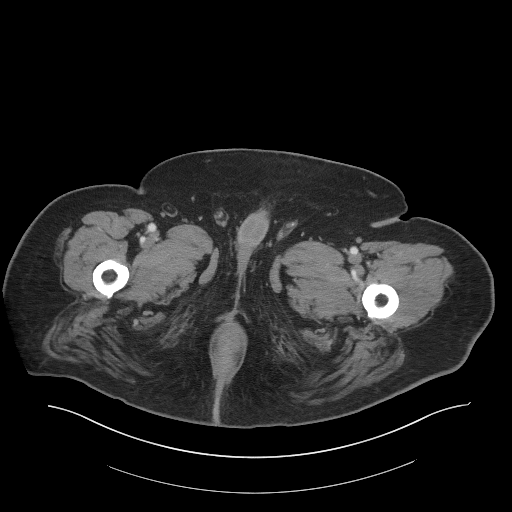
[im 30/134  soft-tissue]
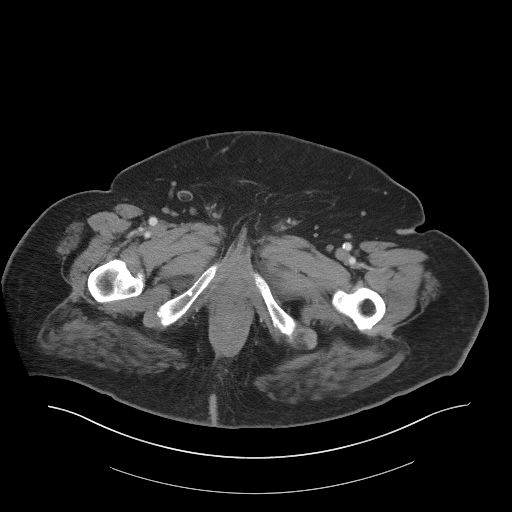
[im 37/134  soft-tissue]
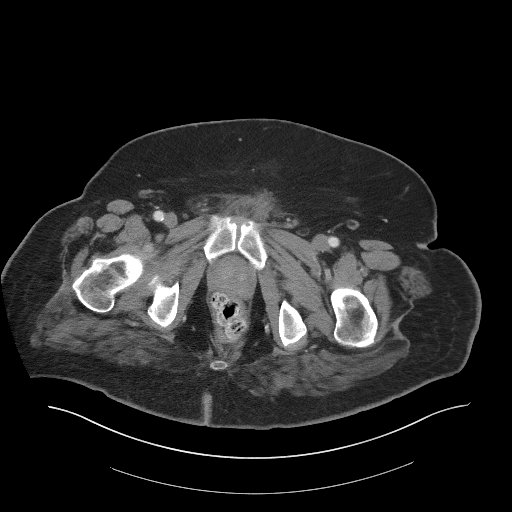
[im 52/134  soft-tissue]
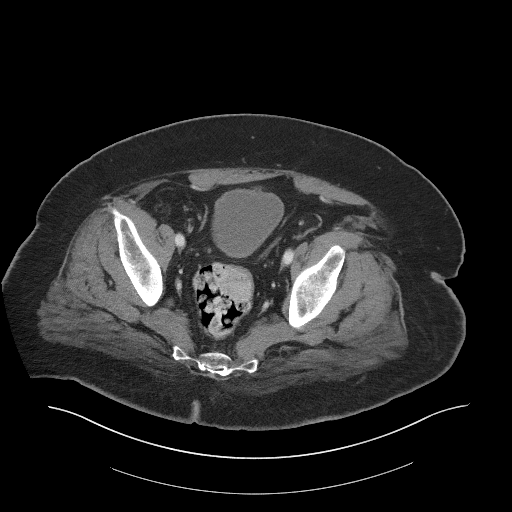
[im 60/134  soft-tissue]
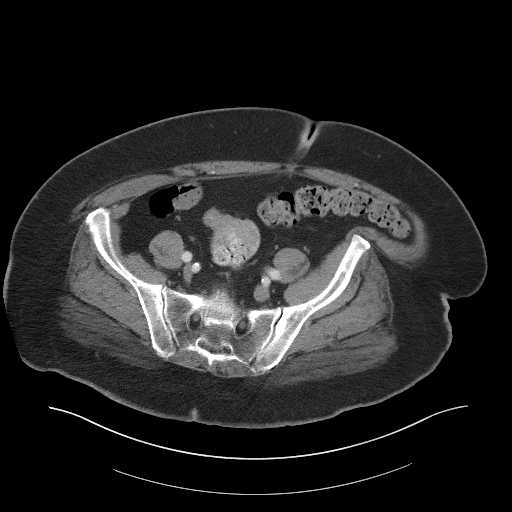
[im 74/134  soft-tissue]
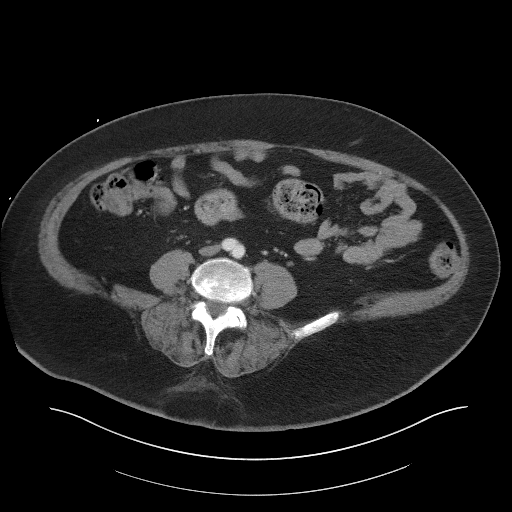
[im 82/134  soft-tissue]
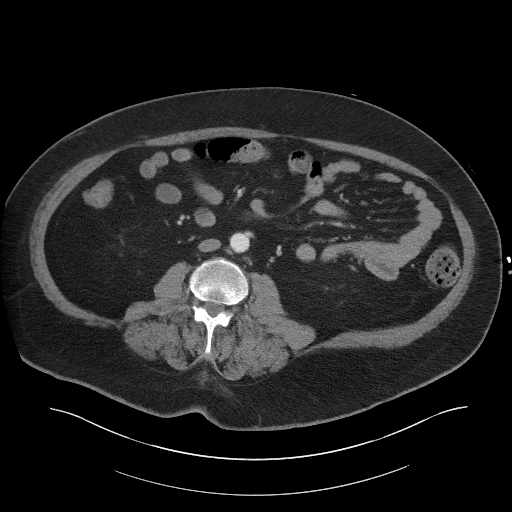
[im 97/134  soft-tissue]
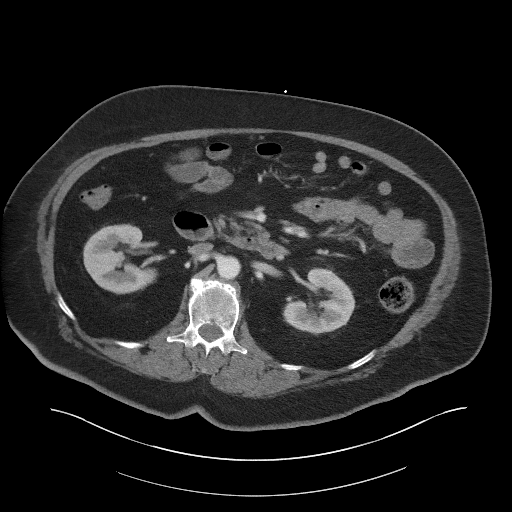
[im 97/134  bone]
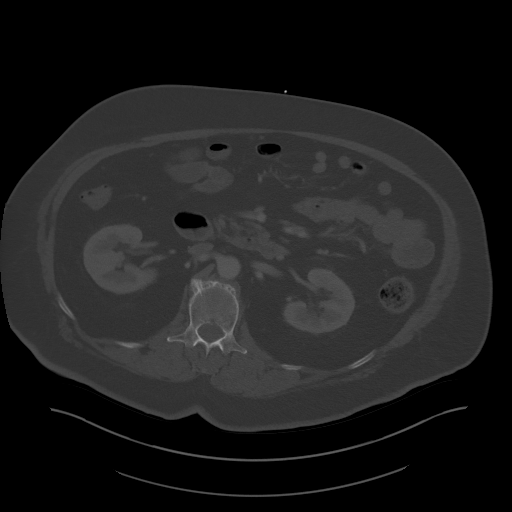
[im 104/134  soft-tissue]
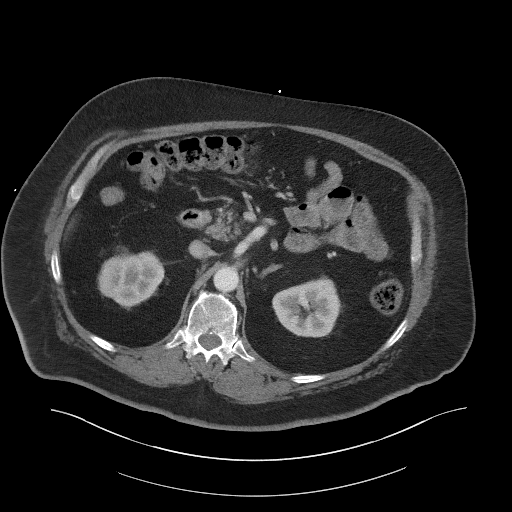
[im 111/134  soft-tissue]
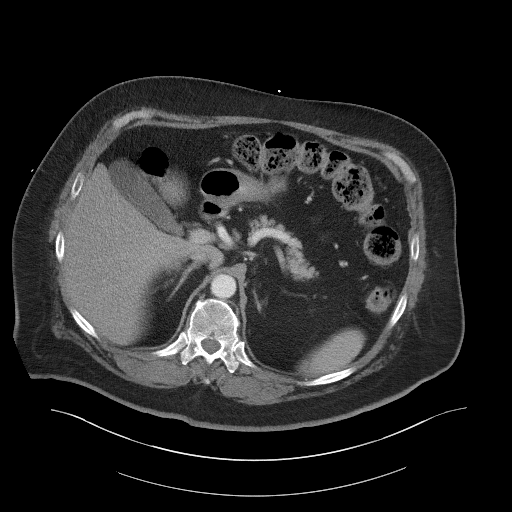
[im 126/134  soft-tissue]
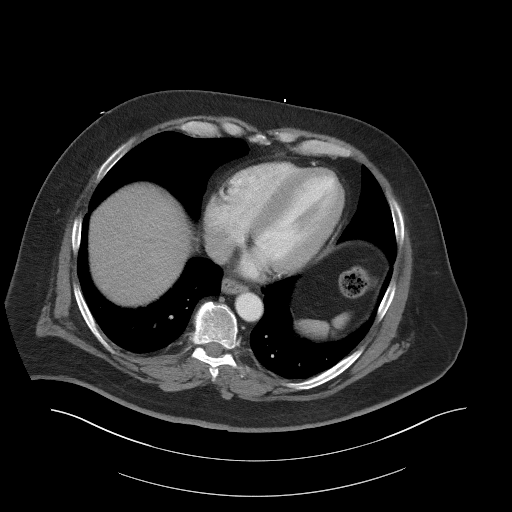

[Series 5: coronal st · coronal · 1.03mm/px · 3 of 117 slices shown]
[im 30/117  soft-tissue]
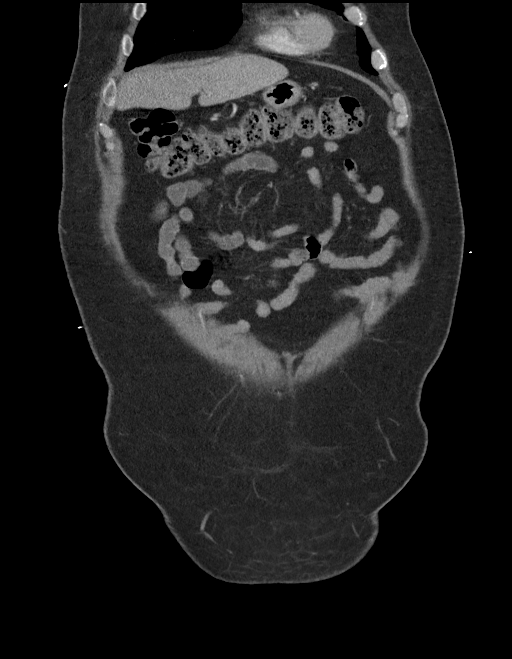
[im 59/117  soft-tissue]
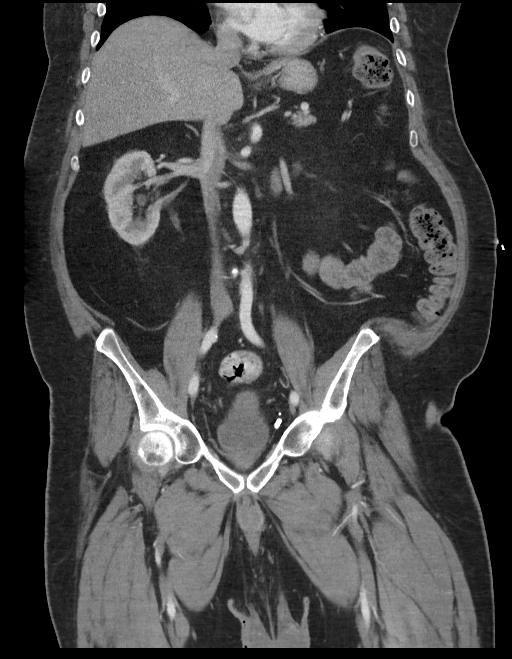
[im 88/117  soft-tissue]
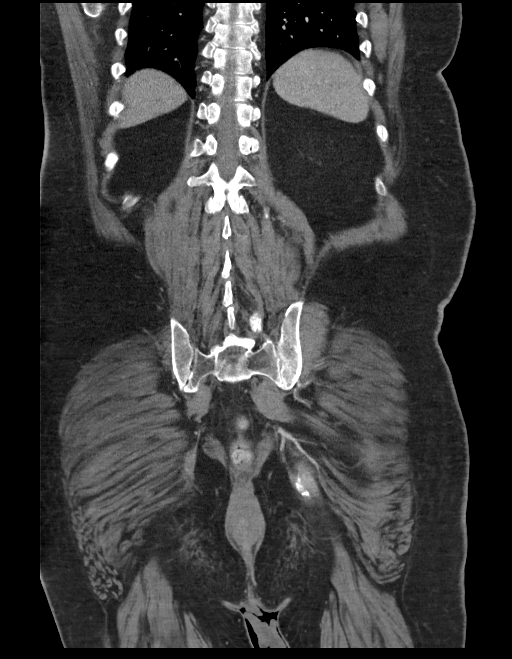

[15 of 46 positions shown; findings below may reference images not displayed]

FINDINGS: Lower chest: Unremarkable

Hepatobiliary: Punctate calcification posteriorly in the right
hepatic lobe compatible with remote inflammatory focus. No biliary
dilatation.

Pancreas: Unremarkable

Spleen: Unremarkable

Adrenals/Urinary Tract: Two nonobstructive left renal calculi are
present, the larger measuring 0.5 cm in long axis. A hypodense left
mid kidney lesion anteriorly measures 1.0 cm in diameter and is
technically nonspecific due to small size, although statistically
likely to be benign. A similar nonspecific hypodense 0.8 by 1.2 cm
lesion is present posteriorly in the right kidney lower pole (image
[DATE]). Both adrenal glands appear normal.

Stomach/Bowel: Prominent stool throughout the colon favors
constipation. No dilated small bowel.

Vascular/Lymphatic: Iliac and common femoral atherosclerotic
calcification noted. Left external iliac node borderline prominent
at 1.0 cm in diameter on image 91/2, previously measuring 1.1 cm in
diameter on [DATE].

Reproductive: Unchanged appearance the prostate gland. I do not
observe normal edema or gas in the perineum. Small bilateral scrotal
hydroceles are present. There appears to be cutaneous thickening in
the scrotum, left greater than right. No obvious gas tracking within
the soft tissues of the scrotum. No well-defined abscess. No penile
soft tissue gas is observed.

Other: No supplemental non-categorized findings.

Musculoskeletal: 1.4 cm sclerotic lesion centrally in the L3
vertebral body is nonspecific but unchanged from [DATE] and
likely benign.
IMPRESSION: 1. Skin thickening in the scrotum, left greater than right, with
small bilateral scrotal hydroceles. No gas tracking within the soft
tissues of the scrotum, penis, or perineal soft tissues. No
well-defined abscess. Please note that while today's imaging does
not show specific imaging findings of CHING gangrene, if there
is clear ulceration, necrosis, or other cutaneous/clinical findings
of soft tissue breakdown then urgent surgical consultation would
still be warranted.
2. Prominent stool throughout the colon favors constipation.
3. Two nonobstructive left renal calculi.
4. Borderline prominent left external iliac node, likely reactive
but not increased from prior exam.
5. Aortic atherosclerosis.

Aortic Atherosclerosis ([7A]-[7A]).

## 2020-01-21 MED ORDER — MORPHINE SULFATE (PF) 4 MG/ML IV SOLN
4.0000 mg | Freq: Once | INTRAVENOUS | Status: AC
  Administered 2020-01-21: 4 mg via INTRAVENOUS
  Filled 2020-01-21: qty 1

## 2020-01-21 MED ORDER — CEPHALEXIN 500 MG PO CAPS
500.0000 mg | ORAL_CAPSULE | Freq: Four times a day (QID) | ORAL | 0 refills | Status: AC
Start: 2020-01-21 — End: 2020-01-31

## 2020-01-21 MED ORDER — ONDANSETRON HCL 4 MG/2ML IJ SOLN
4.0000 mg | Freq: Once | INTRAMUSCULAR | Status: AC
  Administered 2020-01-21: 4 mg via INTRAVENOUS
  Filled 2020-01-21: qty 2

## 2020-01-21 MED ORDER — CEPHALEXIN 250 MG PO CAPS
500.0000 mg | ORAL_CAPSULE | Freq: Once | ORAL | Status: AC
  Administered 2020-01-21: 500 mg via ORAL
  Filled 2020-01-21: qty 2

## 2020-01-21 MED ORDER — IOHEXOL 300 MG/ML  SOLN
100.0000 mL | Freq: Once | INTRAMUSCULAR | Status: AC | PRN
  Administered 2020-01-21: 100 mL via INTRAVENOUS

## 2020-01-21 MED ORDER — CLOTRIMAZOLE 1 % EX CREA
TOPICAL_CREAM | Freq: Two times a day (BID) | CUTANEOUS | Status: DC
  Filled 2020-01-21: qty 15

## 2020-01-21 MED ORDER — CLOTRIMAZOLE 1 % EX CREA
TOPICAL_CREAM | CUTANEOUS | 0 refills | Status: DC
Start: 2020-01-21 — End: 2020-04-14

## 2020-01-21 NOTE — ED Notes (Signed)
Patient transported to CT 

## 2020-01-21 NOTE — Discharge Instructions (Signed)
Your work-up today is consistent with a skin cellulitis of the left groin but did not show evidence of torsion or abscess.  We did not see convincing evidence of the more concerning infection called Fournier's gangrene however, given your diabetes and location this is a concern.  We discussed the options of admission with IV antibiotics versus discharge home and you elected to pursue outpatient management with oral antibiotics.  Please take the antibiotics as directed and use the cream for the likely fungal infection also on the skin.  Please rest and stay hydrated.  Please follow-up with your primary doctor in the next several days.  If any symptoms change or worsen, please return to the nearest emergency department immediately.

## 2020-01-21 NOTE — ED Notes (Signed)
IV attempted x 2 by this nurse and x 1 by RT

## 2020-01-21 NOTE — ED Provider Notes (Signed)
[facility] EMERGENCY DEPARTMENT Provider Note   CSN: [id number] Arrival date & time: 01/21/20  [id number]     History Chief Complaint  Patient presents with  . Abdominal Pain    Tyler Brewer is a 59 y.o. male.  The history is provided by the patient and medical records.  Groin Pain This is a recurrent problem. The current episode started more than 2 days ago. The problem occurs constantly. The problem has not changed since onset.Associated symptoms include abdominal pain. Pertinent negatives include no chest pain, no headaches and no shortness of breath. Nothing aggravates the symptoms. Nothing relieves the symptoms. He has tried nothing for the symptoms. The treatment provided no relief.       Past Medical History:  Diagnosis Date  . Arthritis   . CAD (coronary artery disease)  cardiac cath with moderate disease in a septal branch of the ramus intermedius 04/01/2012  . Depression   . Diabetes mellitus    poorly controlled by his report  . History of narcotic addiction ([place])    past history of back pain  . Hypercholesteremia   . Hypertension   . IBS (irritable bowel syndrome)   . 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[place])   . Neuropathy   . Obesity    Max weight was 390  . OSA on CPAP   . Panic attacks   . Testosterone deficiency   . Vertigo     Patient Active Problem List   Diagnosis Date Noted  . Interstitial lung disease ([place]) 05/02/2017  . Pansinusitis 09/26/2016  . Diabetic polyneuropathy associated with diabetes mellitus due to underlying condition ([place]) 05/08/2016  . Methamphetamine use disorder, severe, dependence ([place]) 02/11/2016  . Substance induced mood disorder ([place]) 02/11/2016  . Exertional dyspnea 11/30/2015  . ADD (attention deficit disorder) 09/29/2015  . Binge eating 09/29/2015  . Syncope 05/05/2012  . Diarrhea 05/05/2012  . Panic attacks 05/05/2012  . OSA (obstructive sleep apnea) 05/05/2012  . HTN (hypertension) 05/05/2012  . Diabetes  mellitus, type II ([place]) 05/05/2012  . Dyslipidemia 05/05/2012  . CAD (coronary artery disease)  cardiac cath with moderate disease in a septal branch of the ramus intermedius 04/01/2012  . Chest pain 04/01/2012  . Drug abuse and dependence ([place]) 04/01/2012  . Family history of coronary artery disease 03/31/2012  . Sleep apnea, on C-pap 03/31/2012  . Hyperlipemia 03/30/2012  . HTN (hypertension), benign 03/30/2012  . Morbid obesity ([place]) 03/30/2012  . DM type 2, uncontrolled, with neuropathy ([place]) 03/30/2012  . Depression with anxiety 03/30/2012    Past Surgical History:  Procedure Laterality Date  . CARDIAC CATHETERIZATION    . LEFT HEART CATHETERIZATION WITH CORONARY ANGIOGRAM N/A 03/31/2012   Procedure: LEFT HEART CATHETERIZATION WITH CORONARY ANGIOGRAM;  Surgeon: Leonie Man, MD;  Location: [facility] CATH LAB;  Service: Cardiovascular;  Laterality: N/A;       Family History  Problem Relation Age of Onset  . Diabetes type II Father   . Hypertension Father   . Pancreatic disease Father 28       Deceased  . Healthy Mother   . Healthy Sister   . Healthy Son   . Healthy Daughter     Social History   Tobacco Use  . Smoking status: Current Every Day Smoker    Packs/day: 0.50    Types: Cigarettes  . Smokeless tobacco: Never Used  Substance Use Topics  . Alcohol use: No    Alcohol/week: 0.0 standard drinks  . Drug use: Not Currently  Types: Methamphetamines    Home Medications Prior to Admission medications   Medication Sig Start Date End Date Taking? Authorizing Provider  amLODipine (NORVASC) 10 MG tablet Take 1 tablet (10 mg total) by mouth daily. 05/02/17   [name], DO  gabapentin (NEURONTIN) 300 MG capsule Take 1 capsule (300 mg total) by mouth 3 (three) times daily for 14 days. 08/14/19 08/28/19  Long, Wonda Olds, MD  HYDROcodone-acetaminophen (NORCO) 5-325 MG tablet Take 1-2 tablets by mouth every 6 (six) hours as needed. 11/04/19   Veryl Speak, MD   Insulin Lispro Prot & Lispro (HUMALOG MIX 75/25 KWIKPEN) (75-25) 100 UNIT/ML Kwikpen Inject 10 Units into the skin 2 (two) times daily. Patient taking differently: Inject 35 Units into the skin 2 (two) times daily.  05/02/17   [name], DO  pravastatin (PRAVACHOL) 40 MG tablet Take 1 tablet (40 mg total) by mouth daily. 05/06/17   [name], DO    Allergies    Gabapentin  Review of Systems   Review of Systems  Constitutional: Positive for chills. Negative for diaphoresis, fatigue and fever.  HENT: Negative for congestion.   Eyes: Negative for visual disturbance.  Respiratory: Negative for cough, chest tightness, shortness of breath and wheezing.   Cardiovascular: Negative for chest pain, palpitations and leg swelling.  Gastrointestinal: Positive for abdominal pain. Negative for constipation, diarrhea, nausea and vomiting.  Genitourinary: Positive for dysuria, genital sores, scrotal swelling and testicular pain. Negative for flank pain, frequency, penile pain, penile swelling and urgency.  Musculoskeletal: Negative for back pain, neck pain and neck stiffness.  Skin: Positive for rash. Negative for wound.  Neurological: Negative for weakness, light-headedness and headaches.  Psychiatric/Behavioral: Negative for agitation.  All other systems reviewed and are negative.   Physical Exam Updated Vital Signs BP (!) 157/103 (BP Location: Right Arm)   Pulse 83   Temp 98 F (36.7 C) (Oral)   Resp 20   Ht 5\' 11"  (1.803 m)   Wt 131.9 kg   SpO2 98%   BMI 40.56 kg/m   Physical Exam Vitals and nursing note reviewed. Exam conducted with a chaperone present.  Constitutional:      General: He is not in acute distress.    Appearance: He is well-developed. He is obese. He is not ill-appearing, toxic-appearing or diaphoretic.  HENT:     Head: Normocephalic and atraumatic.     Right Ear: External ear normal.     Left Ear: External ear normal.     Nose: Nose normal.      Mouth/Throat:     Mouth: Mucous membranes are moist.     Pharynx: Oropharynx is clear. No pharyngeal swelling or oropharyngeal exudate.  Eyes:     Extraocular Movements: Extraocular movements intact.     Conjunctiva/sclera: Conjunctivae normal.     Pupils: Pupils are equal, round, and reactive to light.  Cardiovascular:     Rate and Rhythm: Normal rate.     Heart sounds: Normal heart sounds. No murmur.  Pulmonary:     Effort: Pulmonary effort is normal. No respiratory distress.     Breath sounds: No stridor. No wheezing, rhonchi or rales.  Chest:     Chest wall: No tenderness.  Abdominal:     General: Abdomen is flat. Bowel sounds are normal. There is no distension.     Palpations: Abdomen is soft.     Tenderness: There is abdominal tenderness in the suprapubic area. There is no right CVA  tenderness, left CVA tenderness, guarding or rebound.     Hernia: No hernia is present. There is no hernia in the left inguinal area or right inguinal area.  Genitourinary:    Penis: Erythema present. No tenderness.      Testes: Cremasteric reflex is present.        Right: Tenderness not present.        Left: Tenderness present.    Musculoskeletal:     Cervical back: Normal range of motion and neck supple.  Skin:    General: Skin is warm.     Capillary Refill: Capillary refill takes less than 2 seconds.     Coloration: Skin is not pale.     Findings: Rash present. No erythema.  Neurological:     General: No focal deficit present.     Mental Status: He is alert and oriented to person, place, and time.     Cranial Nerves: No cranial nerve deficit.     Motor: No abnormal muscle tone.     Coordination: Coordination normal.     Deep Tendon Reflexes: Reflexes normal.  Psychiatric:        Mood and Affect: Mood normal.     ED Results / Procedures / Treatments   Labs (all labs ordered are listed, but only abnormal results are displayed) Labs Reviewed  COMPREHENSIVE METABOLIC PANEL -  Abnormal; Notable for the following components:      Result Value   Sodium 133 (*)    CO2 21 (*)    Glucose, Bld 220 (*)    AST 14 (*)    All other components within normal limits  URINALYSIS, ROUTINE W REFLEX MICROSCOPIC - Abnormal; Notable for the following components:   Glucose, UA >=500 (*)    All other components within normal limits  URINALYSIS, MICROSCOPIC (REFLEX) - Abnormal; Notable for the following components:   Bacteria, UA RARE (*)    All other components within normal limits  SARS CORONAVIRUS 2 BY RT PCR (HOSPITAL ORDER, [facility])  URINE CULTURE  CULTURE, BLOOD (ROUTINE X 2)  CULTURE, BLOOD (ROUTINE X 2)  CBC WITH DIFFERENTIAL/PLATELET  LIPASE, BLOOD  LACTIC ACID, PLASMA    EKG None  Radiology CT ABDOMEN PELVIS W CONTRAST  Result Date: 01/21/2020 CLINICAL DATA:  Lower abdominal pain extending into the groin for 3 days. Scrotal soft tissue infection. Query CT findings of Fournier's gangrene. EXAM: CT ABDOMEN AND PELVIS WITH CONTRAST TECHNIQUE: Multidetector CT imaging of the abdomen and pelvis was performed using the standard protocol following bolus administration of intravenous contrast. CONTRAST:  148mL OMNIPAQUE IOHEXOL 300 MG/ML  SOLN COMPARISON:  Scrotal ultrasound of 01/21/2020 and CT abdomen from 03/25/2019 FINDINGS: Lower chest: Unremarkable Hepatobiliary: Punctate calcification posteriorly in the right hepatic lobe compatible with remote inflammatory focus. No biliary dilatation. Pancreas: Unremarkable Spleen: Unremarkable Adrenals/Urinary Tract: Two nonobstructive left renal calculi are present, the larger measuring 0.5 cm in long axis. A hypodense left mid kidney lesion anteriorly measures 1.0 cm in diameter and is technically nonspecific due to small size, although statistically likely to be benign. A similar nonspecific hypodense 0.8 by 1.2 cm lesion is present posteriorly in the right kidney lower pole (image 24/12). Both adrenal  glands appear normal. Stomach/Bowel: Prominent stool throughout the colon favors constipation. No dilated small bowel. Vascular/Lymphatic: Iliac and common femoral atherosclerotic calcification noted. Left external iliac node borderline prominent at 1.0 cm in diameter on image 91/2, previously measuring 1.1 cm in diameter on 03/25/2019. Reproductive:  Unchanged appearance the prostate gland. I do not observe normal edema or gas in the perineum. Small bilateral scrotal hydroceles are present. There appears to be cutaneous thickening in the scrotum, left greater than right. No obvious gas tracking within the soft tissues of the scrotum. No well-defined abscess. No penile soft tissue gas is observed. Other: No supplemental non-categorized findings. Musculoskeletal: 1.4 cm sclerotic lesion centrally in the L3 vertebral body is nonspecific but unchanged from 06/11/2018 and likely benign. IMPRESSION: 1. Skin thickening in the scrotum, left greater than right, with small bilateral scrotal hydroceles. No gas tracking within the soft tissues of the scrotum, penis, or perineal soft tissues. No well-defined abscess. Please note that while today's imaging does not show specific imaging findings of Fournier's gangrene, if there is clear ulceration, necrosis, or other cutaneous/clinical findings of soft tissue breakdown then urgent surgical consultation would still be warranted. 2. Prominent stool throughout the colon favors constipation. 3. Two nonobstructive left renal calculi. 4. Borderline prominent left external iliac node, likely reactive but not increased from prior exam. 5. Aortic atherosclerosis. Aortic Atherosclerosis (ICD10-I70.0). Electronically Signed   By: Van Clines M.D.   On: 01/21/2020 11:20   US SCROTUM W/DOPPLER  Result Date: 01/21/2020 CLINICAL DATA:  Left testicular pain. Left groin and abdominal pain. EXAM: SCROTAL ULTRASOUND DOPPLER ULTRASOUND OF THE TESTICLES TECHNIQUE: Complete ultrasound  examination of the testicles, epididymis, and other scrotal structures was performed. Color and spectral Doppler ultrasound were also utilized to evaluate blood flow to the testicles. COMPARISON:  03/25/2019. FINDINGS: Right testicle Measurements: 3.9 x 2.2 x 2.4 cm. No mass or microlithiasis visualized. Left testicle Measurements: 3.7 x 2.3 x 2.8 cm. No mass or microlithiasis visualized. Scrotal skin thickening. Right epididymis:  Normal in size and appearance. Left epididymis: The left epididymis is prominent suggesting epididymitis. Hydrocele: Small bilateral Varicocele: None IMPRESSION: 1. Left scrotal skin thickening suggesting cellulitis. 2. Left epididymis is prominent suggesting epididymitis. No evidence of testicular mass or torsion. Electronically Signed   By: Marcello Moores  Register   On: 01/21/2020 10:41    Procedures Procedures (including critical care time)  Medications Ordered in ED Medications  morphine 4 MG/ML injection 4 mg (4 mg Intravenous Given 01/21/20 0928)  ondansetron (ZOFRAN) injection 4 mg (4 mg Intravenous Given 01/21/20 0928)  iohexol (OMNIPAQUE) 300 MG/ML solution 100 mL (100 mLs Intravenous Contrast Given 01/21/20 1034)  cephALEXin (KEFLEX) capsule 500 mg (500 mg Oral Given 01/21/20 1403)    ED Course  I have reviewed the triage vital signs and the nursing notes.  Pertinent labs & imaging results that were available during my care of the patient were reviewed by me and considered in my medical decision making (see chart for details).    MDM Rules/Calculators/A&P                      Tyler Brewer is a 59 y.o. male with a past medical history significant for hypertension, hyperlipidemia, poorly controlled diabetes, sleep apnea, dyslipidemia, obesity, CAD, and patient verbally reporting history of a deep groin infection in the past who presents with left testicle and groin pain.  Patient reports that for the last 3 days, he has had pain in his left testicle and his lower  abdomen and groin.  He reports that this is similar when he had a deep infection in his groin several years ago and had to be admitted for several days of antibiotics.  He reports the pain is severe and he is having  difficulty holding different positions with sitting.  He does report some dysuria as well as some constipation.  He reports no nausea no vomiting.  He reports having some fatigue and sweats with diaphoresis at home prior to EMS arrival.  He reports some chills but no fevers at home.  He reports no chest pain, shortness of breath, or cough.  He denies any recent trauma.  He reports that he has had some fungal infections in his groin in the past and thinks his skin may have some of that as well.  On exam with a chaperone, patient does have tenderness of the left testicle but not the right.  He had some tenderness in the base of the groin as well as into the inguinal area and into the lower abdomen.  There was some sporadic redness seen but no induration.  Patient had otherwise unremarkable penis.  No ulcers or drainage seen.  Patient's back was nontender with no CVA tenderness.  Lungs were clear.  Abdominal sounds were present.  Chest was nontender.  Patient had some evidence of tinea infection in his inner thighs.  Based on the patient's history of reported deep infection in the groin requiring IV antibiotics, I am somewhat concerned with his diabetes that he could be having early Fournier's gangrene with his left scrotum, left abdomen, and left groin.  Will get CT scan to further evaluate as well as ultrasound to make sure there is not a torsion.  We will get screening labs as well as give him pain medicine nausea medicine.  With his nausea, will give nausea medicine.  Will look for UTI given the dysuria report.  We will get blood cultures and testing for Covid so as not to delay any procedure he may need.  If everything else is otherwise reassuring, patient will likely need topical antifungal  medication for his groin for the tinea on the inner thighs.  Anticipate reassessment after work-up.  1:53 PM Patient's work-up began to return and showed no evidence of UTI.  Cultures were obtained.  CBC shows no leukocytosis or anemia.  Metabolic panel shows slightly low sodium but otherwise normal kidney function and not elevated liver function.  Lipase not elevated.  Lactic acid not elevated, doubt sepsis.  Scrotal ultrasound showed no evidence of testicular mass or torsion but did show some skin thickening suggestive of cellulitis.  No abscess seen.  CT scan was also completed and did not show convincing evidence of Fournier's gangrene but did show some cellulitis in the groin.  There is no tracking gas within the soft tissues penis or perineal tissues.  Otherwise prominent stool and some nonproductive calculi.  Lymph nodes similar to prior.  Had a long conversation with patient about 4 days gangrene given his history of skin groin infections needing IV antibiotics and admission.  Patient reports that he would rather not be admitted and try treating this with oral antibiotics given his other reassuring labs and appearance.  Patient will be given oral antibiotics as well as topical cream for the likely tinea infection on the skin and will follow up with a primary doctor.  Patient understands extremely strict return precautions for any new or worsened symptoms as this could develop to 4 days gangrene can be life-threatening at that time.  Patient agrees and will watch his sugars.  He had no other questions or concerns and was discharged in good condition.   Final Clinical Impression(s) / ED Diagnoses Final diagnoses:  Cellulitis of groin, left  Tinea corporis    Rx / DC Orders ED Discharge Orders         Ordered    cephALEXin (KEFLEX) 500 MG capsule  4 times daily     01/21/20 1358    clotrimazole (LOTRIMIN) 1 % cream     01/21/20 1358          Clinical Impression: 1. Cellulitis of  groin, left   2. Tinea corporis     Disposition: Discharge  Condition: Good  I have discussed the results, Dx and Tx plan with the pt(& family if present). He/she/they expressed understanding and agree(s) with the plan. Discharge instructions discussed at great length. Strict return precautions discussed and pt &/or family have verbalized understanding of the instructions. No further questions at time of discharge.    New Prescriptions   CEPHALEXIN (KEFLEX) 500 MG CAPSULE    Take 1 capsule (500 mg total) by mouth 4 (four) times daily for 10 days.   CLOTRIMAZOLE (LOTRIMIN) 1 % CREAM    Apply to affected area 2 times daily    Follow Up: [name], DO [address] [facility] [address] [place] [address] [phone]     [facility] [address] [id number] [place] [phone]       Edwena Mayorga, Gwenyth Allegra, MD 01/21/20 2040

## 2020-01-21 NOTE — ED Triage Notes (Signed)
Pt arrived via EMS c/o abd pain radiating to testicles x 3 days.

## 2020-01-22 LAB — URINE CULTURE: Culture: NO GROWTH

## 2020-01-26 LAB — CULTURE, BLOOD (ROUTINE X 2)
Culture: NO GROWTH
Culture: NO GROWTH
Special Requests: ADEQUATE
Special Requests: ADEQUATE

## 2020-03-04 ENCOUNTER — Ambulatory Visit: Payer: Medicaid Other | Admitting: Family Medicine

## 2020-03-10 ENCOUNTER — Ambulatory Visit (INDEPENDENT_AMBULATORY_CARE_PROVIDER_SITE_OTHER): Payer: Medicare HMO | Admitting: Family Medicine

## 2020-03-10 ENCOUNTER — Encounter: Payer: Self-pay | Admitting: Family Medicine

## 2020-03-10 ENCOUNTER — Other Ambulatory Visit: Payer: Self-pay

## 2020-03-10 ENCOUNTER — Ambulatory Visit (HOSPITAL_BASED_OUTPATIENT_CLINIC_OR_DEPARTMENT_OTHER)
Admission: RE | Admit: 2020-03-10 | Discharge: 2020-03-10 | Disposition: A | Discharge status: Home / Self Care | Payer: Medicare HMO | Source: Ambulatory Visit | Attending: Family Medicine | Admitting: Family Medicine

## 2020-03-10 VITALS — BP 152/92 | HR 86 | Temp 98.2°F | Resp 20 | Ht 71.0 in | Wt 290.0 lb

## 2020-03-10 DIAGNOSIS — L03314 Cellulitis of groin: Secondary | ICD-10-CM | POA: Diagnosis not present

## 2020-03-10 DIAGNOSIS — E1140 Type 2 diabetes mellitus with diabetic neuropathy, unspecified: Secondary | ICD-10-CM | POA: Diagnosis not present

## 2020-03-10 DIAGNOSIS — E1169 Type 2 diabetes mellitus with other specified complication: Secondary | ICD-10-CM | POA: Diagnosis not present

## 2020-03-10 DIAGNOSIS — E785 Hyperlipidemia, unspecified: Secondary | ICD-10-CM | POA: Diagnosis not present

## 2020-03-10 DIAGNOSIS — M542 Cervicalgia: Secondary | ICD-10-CM

## 2020-03-10 DIAGNOSIS — N138 Other obstructive and reflux uropathy: Secondary | ICD-10-CM

## 2020-03-10 DIAGNOSIS — E1165 Type 2 diabetes mellitus with hyperglycemia: Secondary | ICD-10-CM | POA: Diagnosis not present

## 2020-03-10 DIAGNOSIS — I10 Essential (primary) hypertension: Secondary | ICD-10-CM

## 2020-03-10 DIAGNOSIS — M25511 Pain in right shoulder: Secondary | ICD-10-CM

## 2020-03-10 DIAGNOSIS — R2689 Other abnormalities of gait and mobility: Secondary | ICD-10-CM

## 2020-03-10 DIAGNOSIS — G4733 Obstructive sleep apnea (adult) (pediatric): Secondary | ICD-10-CM | POA: Diagnosis not present

## 2020-03-10 DIAGNOSIS — B356 Tinea cruris: Secondary | ICD-10-CM

## 2020-03-10 DIAGNOSIS — N401 Enlarged prostate with lower urinary tract symptoms: Secondary | ICD-10-CM | POA: Diagnosis not present

## 2020-03-10 LAB — VITAMIN B12: Vitamin B-12: 363 pg/mL (ref 211–911)

## 2020-03-10 LAB — COMPREHENSIVE METABOLIC PANEL
ALT: 16 U/L (ref 0–53)
AST: 9 U/L (ref 0–37)
Albumin: 3.7 g/dL (ref 3.5–5.2)
Alkaline Phosphatase: 101 U/L (ref 39–117)
BUN: 18 mg/dL (ref 6–23)
CO2: 23 mEq/L (ref 19–32)
Calcium: 9 mg/dL (ref 8.4–10.5)
Chloride: 98 mEq/L (ref 96–112)
Creatinine, Ser: 0.96 mg/dL (ref 0.40–1.50)
GFR: 80.78 mL/min (ref >60.00)
Glucose, Bld: 413 mg/dL — ABNORMAL HIGH (ref 70–99)
Potassium: 4.1 mEq/L (ref 3.5–5.1)
Sodium: 130 mEq/L — ABNORMAL LOW (ref 135–145)
Total Bilirubin: 0.5 mg/dL (ref 0.2–1.2)
Total Protein: 6.4 g/dL (ref 6.0–8.3)

## 2020-03-10 LAB — CBC WITH DIFFERENTIAL/PLATELET
Basophils Absolute: 0.1 10*3/uL (ref 0.0–0.1)
Basophils Relative: 0.9 % (ref 0.0–3.0)
Eosinophils Absolute: 0.4 10*3/uL (ref 0.0–0.7)
Eosinophils Relative: 5 % (ref 0.0–5.0)
HCT: 47.8 % (ref 39.0–52.0)
Hemoglobin: 16.3 g/dL (ref 13.0–17.0)
Lymphocytes Relative: 22.8 % (ref 12.0–46.0)
Lymphs Abs: 1.7 10*3/uL (ref 0.7–4.0)
MCHC: 34.1 g/dL (ref 30.0–36.0)
MCV: 87.4 fl (ref 78.0–100.0)
Monocytes Absolute: 0.6 10*3/uL (ref 0.1–1.0)
Monocytes Relative: 7.7 % (ref 3.0–12.0)
Neutro Abs: 4.6 10*3/uL (ref 1.4–7.7)
Neutrophils Relative %: 63.6 % (ref 43.0–77.0)
Platelets: 250 10*3/uL (ref 150.0–400.0)
RBC: 5.48 Mil/uL (ref 4.22–5.81)
RDW: 13.5 % (ref 11.5–15.5)
WBC: 7.3 10*3/uL (ref 4.0–10.5)

## 2020-03-10 LAB — LIPID PANEL
Cholesterol: 172 mg/dL (ref 0–200)
HDL: 46.5 mg/dL (ref >39.00)
LDL Cholesterol: 100 mg/dL — ABNORMAL HIGH (ref 0–99)
NonHDL: 125.29
Total CHOL/HDL Ratio: 4
Triglycerides: 126 mg/dL (ref 0.0–149.0)
VLDL: 25.2 mg/dL (ref 0.0–40.0)

## 2020-03-10 LAB — HEMOGLOBIN A1C: Hgb A1c MFr Bld: 11.4 % — ABNORMAL HIGH (ref 4.6–6.5)

## 2020-03-10 LAB — TSH: TSH: 2.66 u[IU]/mL (ref 0.35–4.50)

## 2020-03-10 LAB — PSA: PSA: 1.15 ng/mL (ref 0.10–4.00)

## 2020-03-10 IMAGING — CR DG CERVICAL SPINE COMPLETE 4+V
6 series · 6 of 6 positions shown · non-contrast
Comparison: None.

CLINICAL DATA: Recent fall, right neck pain

EXAM:
CERVICAL SPINE - COMPLETE 4+ VIEW

[w c-spine lat]
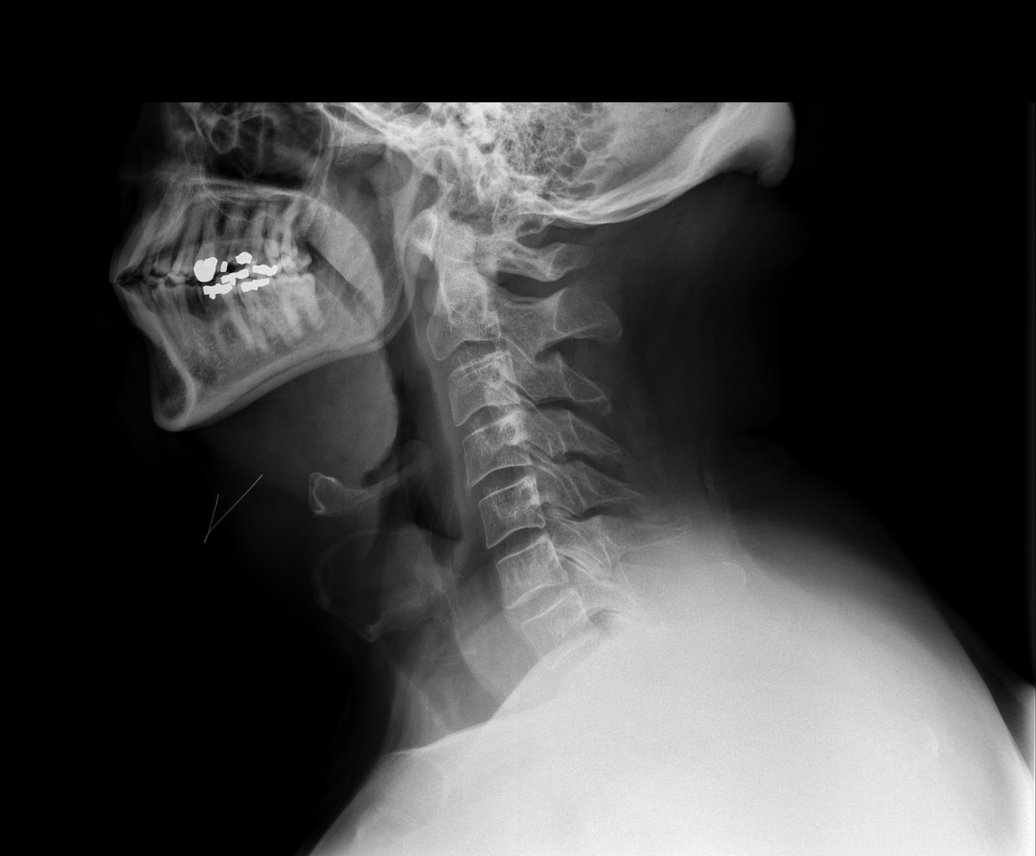

[w c-spine oblique (1 of 2)]
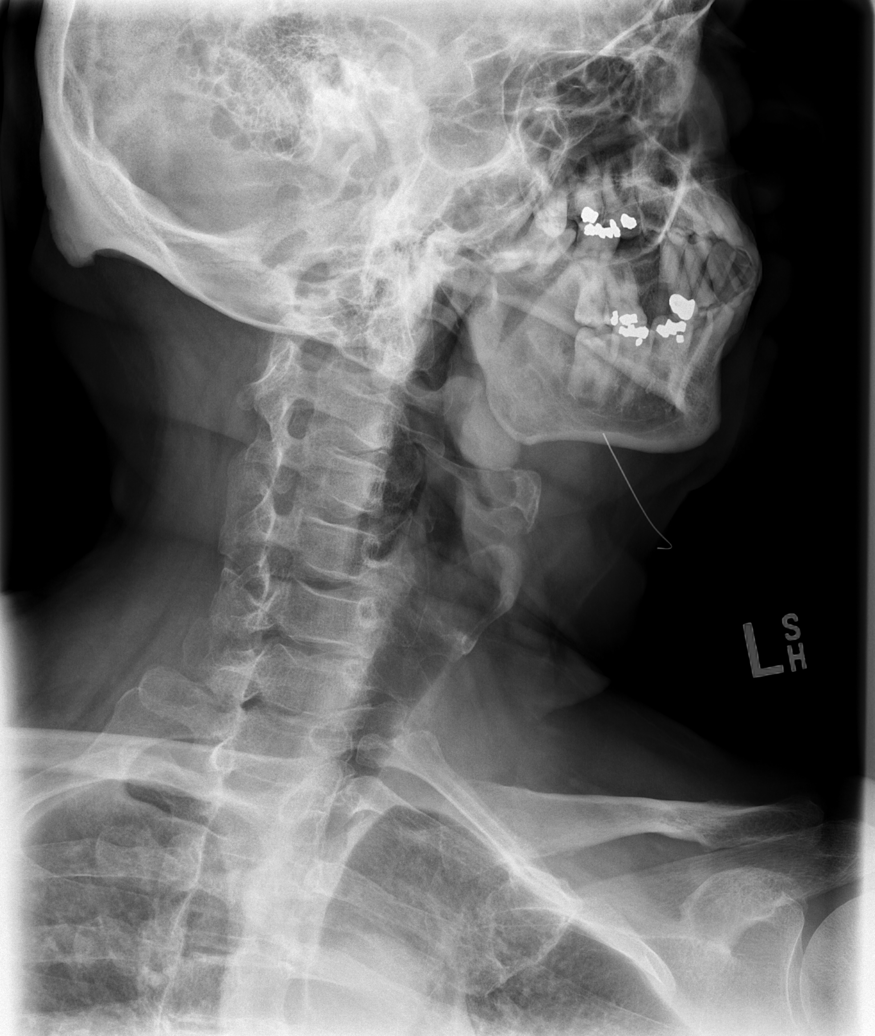

[w c-spine oblique (2 of 2)]
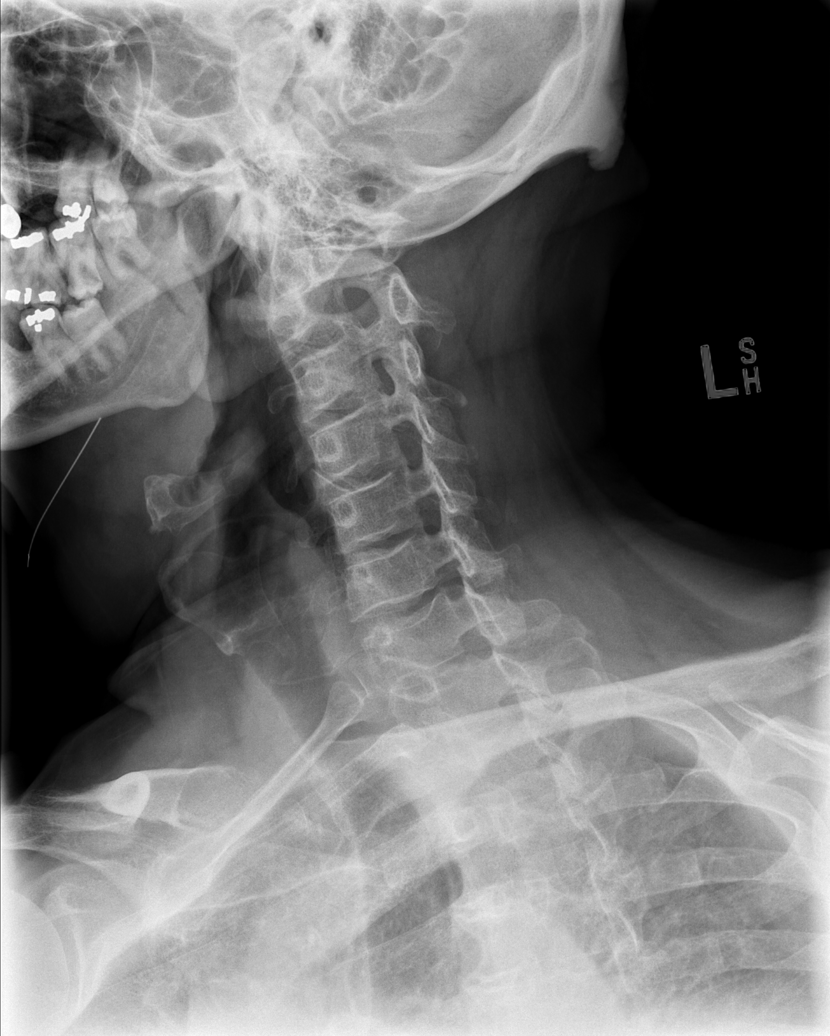

[w c-spine odontoid]
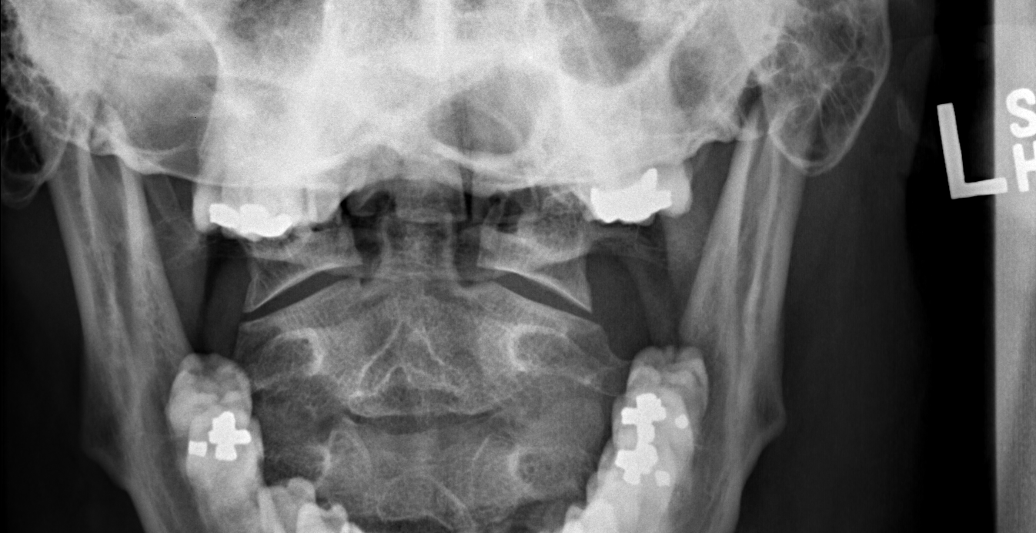

[w c-spine a.p.]
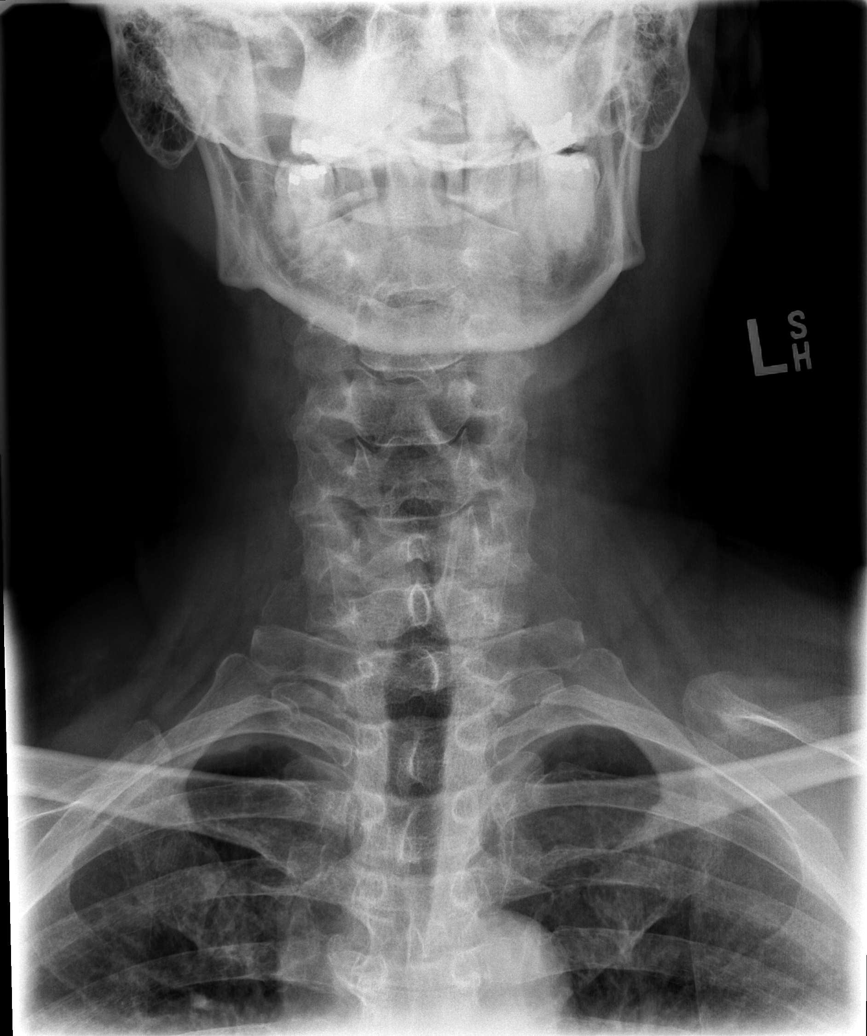

[w swimmers view]
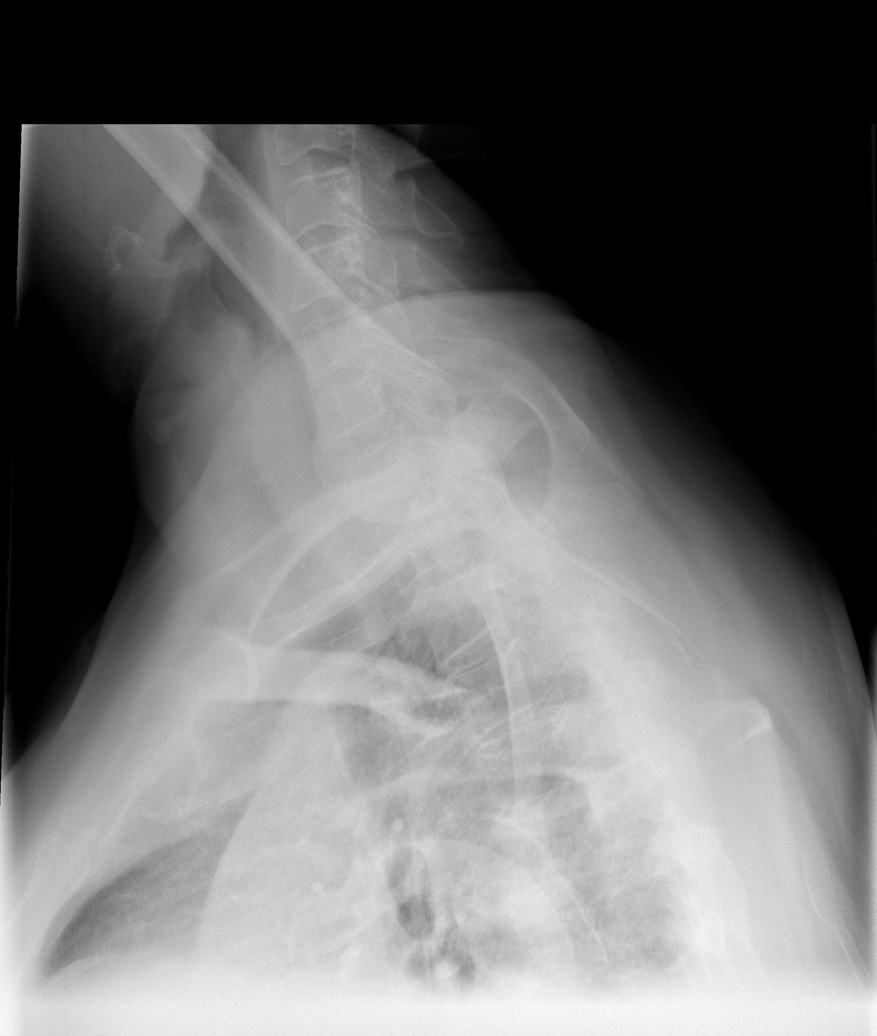

[6 of 6 positions shown; findings below may reference images not displayed]

FINDINGS: On the lateral view the cervical spine is visualized to the level of
C7-T1. Mild straightening of the cervical spine. Pre-vertebral soft
tissues are within normal limits. No fracture is detected in the
cervical spine. Dens is well positioned between the lateral masses
of C1. Minimal degenerative disc disease at C4-5. no cervical spine
subluxation. No significant facet arthropathy. No appreciable
foraminal stenosis. No aggressive-appearing focal osseous lesions.
IMPRESSION: No cervical spine fracture or subluxation. Minimal degenerative disc
disease at C4-5. Mild straightening of the cervical spine, usually
due to positioning and/or muscle spasm.

## 2020-03-10 IMAGING — CR DG SHOULDER 2+V*R*
3 series · 3 of 3 positions shown · non-contrast
Comparison: None.

CLINICAL DATA: Recent fall.  Right shoulder pain.

EXAM:
RIGHT SHOULDER - 2+ VIEW

[w shoulder grashey right]
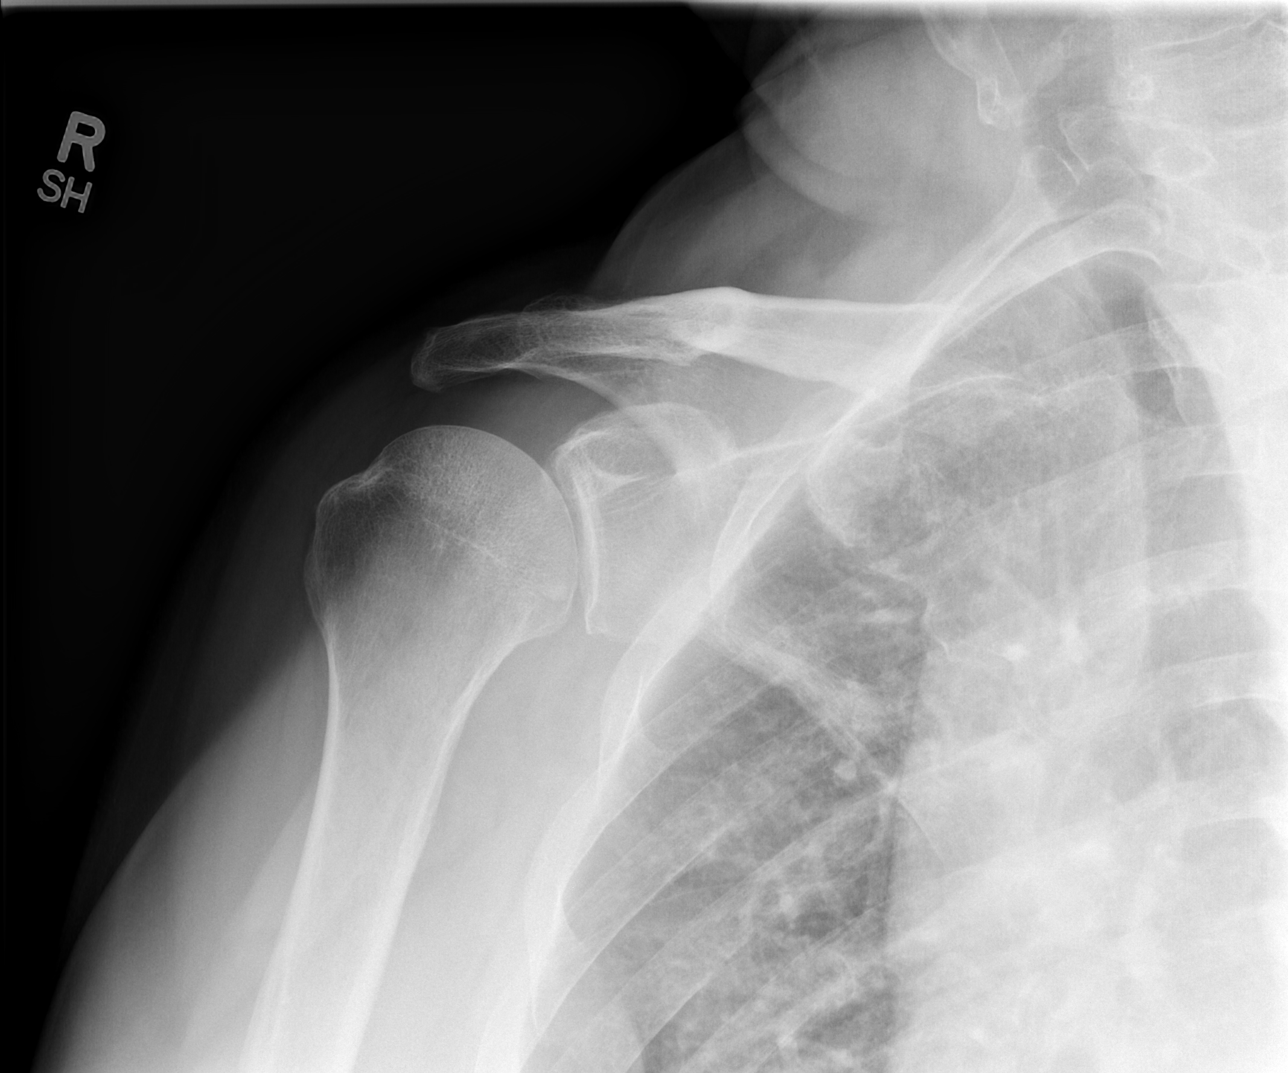

[w shoulder y view right]
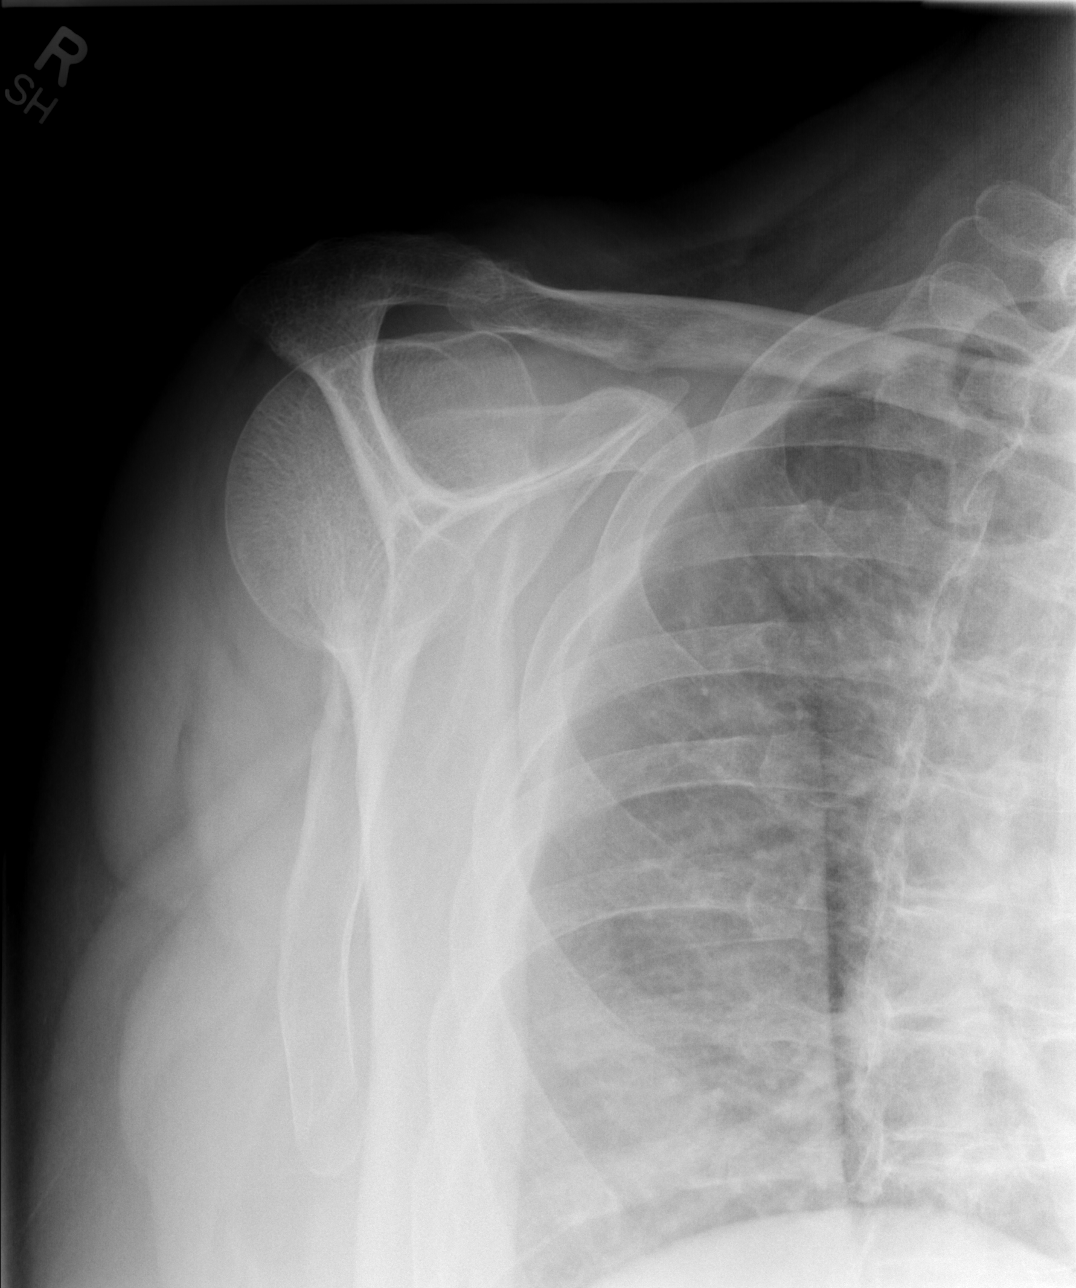

[x shoulder axillary right]
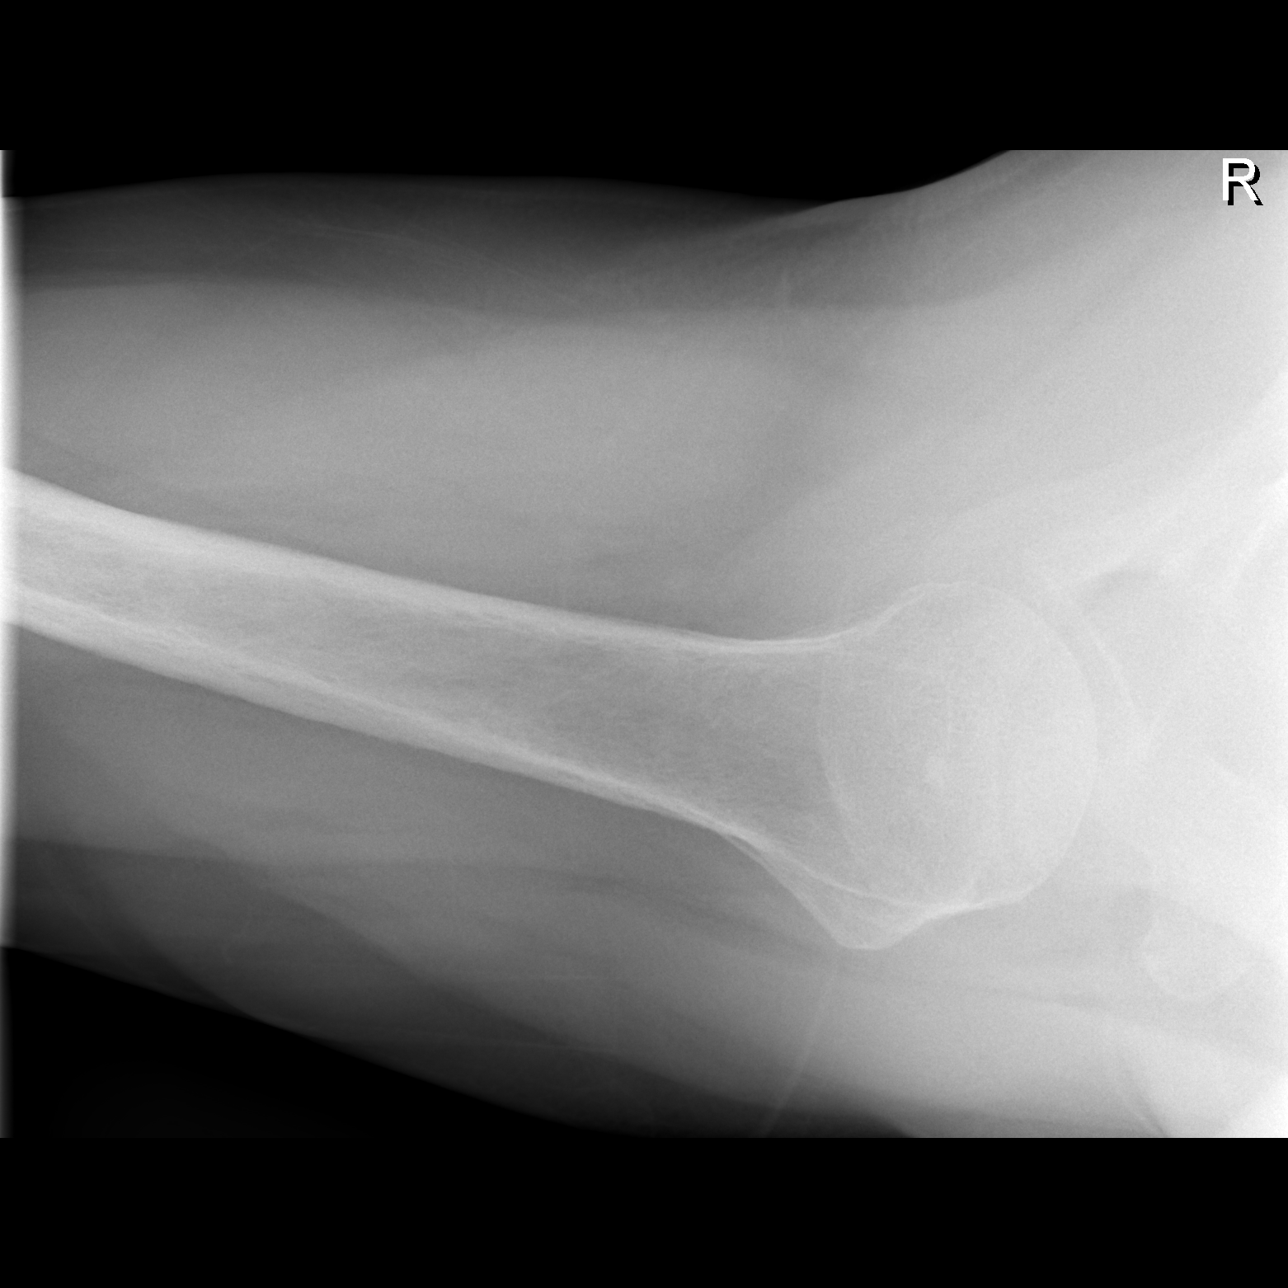

[3 of 3 positions shown; findings below may reference images not displayed]

FINDINGS: No fracture. No glenohumeral dislocation. No evidence of
acromioclavicular separation. No significant arthropathy. No
suspicious focal osseous lesions. No radiopaque foreign bodies or
pathologic soft tissue calcifications.
IMPRESSION: No right shoulder fracture or malalignment.

## 2020-03-10 MED ORDER — TAMSULOSIN HCL 0.4 MG PO CAPS: 0.4000 mg | ORAL_CAPSULE | Freq: Every day | ORAL | 3 refills | Status: DC

## 2020-03-10 MED ORDER — NYSTATIN 100000 UNIT/GM EX POWD: 1.0000 "application " | Freq: Three times a day (TID) | CUTANEOUS | 0 refills | Status: DC

## 2020-03-10 MED ORDER — PRAVASTATIN SODIUM 40 MG PO TABS: 40.0000 mg | ORAL_TABLET | Freq: Every day | ORAL | 4 refills | Status: DC

## 2020-03-10 MED ORDER — HYDROCODONE-ACETAMINOPHEN 5-325 MG PO TABS: 1.0000 | ORAL_TABLET | Freq: Four times a day (QID) | ORAL | 0 refills | Status: DC | PRN

## 2020-03-10 MED ORDER — GABAPENTIN 300 MG PO CAPS: 300.0000 mg | ORAL_CAPSULE | Freq: Three times a day (TID) | ORAL | 0 refills | Status: DC

## 2020-03-10 MED ORDER — CEPHALEXIN 500 MG PO CAPS: 500.0000 mg | ORAL_CAPSULE | Freq: Two times a day (BID) | ORAL | 0 refills | Status: DC

## 2020-03-10 MED ORDER — LOSARTAN POTASSIUM 50 MG PO TABS: 50.0000 mg | ORAL_TABLET | Freq: Every day | ORAL | 2 refills | Status: DC

## 2020-03-10 NOTE — Progress Notes (Signed)
Patient ID: Tyler Brewer, male    DOB: 08/15/1961  Age: 59 y.o. MRN: [id number]    Subjective:  Subjective  HPI Tyler Brewer presents for f/u er --- he has not been here in a long time.  He had no ins.  He recently got his disability medicare.   He fell in [place] 3 days ago getting out of the shower and has pain in neck and shoulders and has had balance issues  Pt needs new referrals for urology , neurology, pulmonology and endocrinology----  HYPERTENSION   Blood pressure range-not controlled   Chest pain- no      Dyspnea- no Lightheadedness- no   Edema- no  Other side effects - no   Medication compliance: good Low salt diet-yes     DIABETES    Blood Sugar ranges-[address] ave   Polyuria- no New Visual problems- no  Hypoglycemic symptoms- no  Other side effects-no Medication compliance - poor Last eye exam- due Foot exam- today   HYPERLIPIDEMIA  Medication compliance- poor  RUQ pain- no  Muscle aches- no Other side effects-no      Review of Systems  Constitutional: Negative for appetite change, diaphoresis, fatigue and unexpected weight change.  Eyes: Negative for pain, redness and visual disturbance.  Respiratory: Negative for cough, chest tightness, shortness of breath and wheezing.   Cardiovascular: Negative for chest pain, palpitations and leg swelling.  Endocrine: Negative for cold intolerance, heat intolerance, polydipsia, polyphagia and polyuria.  Genitourinary: Negative for difficulty urinating, dysuria and frequency.  Musculoskeletal: Positive for gait problem.  Skin: Positive for rash.  Neurological: Positive for weakness and numbness. Negative for dizziness, light-headedness and headaches.  Psychiatric/Behavioral: Negative.     History Past Medical History:  Diagnosis Date  . Arthritis   . CAD (coronary artery disease)  cardiac cath with moderate disease in a septal branch of the ramus intermedius 04/01/2012  . Depression   . Diabetes mellitus    poorly  controlled by his report  . History of narcotic addiction ([place])    past history of back pain  . Hypercholesteremia   . Hypertension   . IBS (irritable bowel syndrome)   . 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 Neuropathy   . Obesity    Max weight was 390  . OSA on CPAP   . Panic attacks   . Testosterone deficiency   . Vertigo     He has a past surgical history that includes Cardiac catheterization and left heart catheterization with coronary angiogram (N/A, 03/31/2012).   His family history includes Diabetes type II in his father; Healthy in his daughter, mother, sister, and son; Hypertension in his father; Pancreatic disease (age of onset: 11) in his father.He reports that he has been smoking cigarettes. He has been smoking about 0.50 packs per day. He has never used smokeless tobacco. He reports previous drug use. Drug: Methamphetamines. He reports that he does not drink alcohol.  Current Outpatient Medications on File Prior to Visit  Medication Sig Dispense Refill  . clotrimazole (LOTRIMIN) 1 % cream Apply to affected area 2 times daily 15 g 0  . Insulin Lispro Prot & Lispro (HUMALOG MIX 75/25 KWIKPEN) (75-25) 100 UNIT/ML Kwikpen Inject 10 Units into the skin 2 (two) times daily. (Patient taking differently: Inject 35 Units into the skin 2 (two) times daily. ) 3 mL 0   No current facility-administered medications on file prior to visit.     Objective:  Objective  Physical Exam Vitals and nursing note  reviewed.  Constitutional:      General: He is not in acute distress.    Appearance: He is well-developed. He is not diaphoretic.  HENT:     Head: Normocephalic and atraumatic.     Right Ear: External ear normal.     Left Ear: External ear normal.     Nose: Nose normal.     Mouth/Throat:     Pharynx: No oropharyngeal exudate.  Eyes:     General:        Right eye: No discharge.        Left eye: No discharge.     Conjunctiva/sclera: Conjunctivae normal.     Pupils: Pupils are  equal, round, and reactive to light.  Neck:     Thyroid: No thyromegaly.     Vascular: No JVD.  Cardiovascular:     Rate and Rhythm: Normal rate and regular rhythm.     Heart sounds: No murmur heard.  No friction rub. No gallop.   Pulmonary:     Effort: Pulmonary effort is normal. No respiratory distress.     Breath sounds: Normal breath sounds. No wheezing or rales.  Chest:     Chest wall: No tenderness.  Abdominal:     General: Bowel sounds are normal. There is no distension.     Palpations: Abdomen is soft. There is no mass.     Tenderness: There is no abdominal tenderness. There is no guarding or rebound.  Genitourinary:    Penis: Normal.      Prostate: Normal.     Rectum: Normal. Guaiac result negative.  Musculoskeletal:        General: Tenderness present.     Right shoulder: Tenderness present. No bony tenderness or crepitus. Decreased range of motion. Normal strength. Normal pulse.     Left shoulder: Normal.     Cervical back: Neck supple. Spasms and tenderness present. No swelling, edema, deformity, erythema, signs of trauma, torticollis or crepitus. Pain with movement present. Decreased range of motion.  Lymphadenopathy:     Cervical: No cervical adenopathy.  Skin:    General: Skin is warm and dry.     Coloration: Skin is not pale.     Findings: No erythema or rash.  Neurological:     Mental Status: He is alert and oriented to person, place, and time.     Motor: No abnormal muscle tone.     Deep Tendon Reflexes: Reflexes are normal and symmetric. Reflexes normal.  Psychiatric:        Behavior: Behavior normal.        Thought Content: Thought content normal.        Judgment: Judgment normal.    BP (!) 152/92 (BP Location: Right Arm, Patient Position: Sitting, Cuff Size: Large)   Pulse 86   Temp 98.2 F (36.8 C) (Oral)   Resp 20   Ht 5\' 11"  (1.803 m)   Wt 290 lb (131.5 kg)   SpO2 97%   BMI 40.45 kg/m  Wt Readings from Last 3 Encounters:  03/10/20 290 lb  (131.5 kg)  01/21/20 290 lb 12.6 oz (131.9 kg)  01/07/20 290 lb (131.5 kg)     Lab Results  Component Value Date   WBC 7.3 03/10/2020   HGB 16.3 03/10/2020   HCT 47.8 03/10/2020   PLT 250.0 03/10/2020   GLUCOSE 413 (H) 03/10/2020   CHOL 172 03/10/2020   TRIG 126.0 03/10/2020   HDL 46.50 03/10/2020   LDLDIRECT 124.0 05/02/2017   LDLCALC  100 (H) 03/10/2020   ALT 16 03/10/2020   AST 9 03/10/2020   NA 130 (L) 03/10/2020   K 4.1 03/10/2020   CL 98 03/10/2020   CREATININE 0.96 03/10/2020   BUN 18 03/10/2020   CO2 23 03/10/2020   TSH 2.66 03/10/2020   PSA 1.15 03/10/2020   INR 1.1 07/30/2013   HGBA1C 11.4 (H) 03/10/2020   MICROALBUR 5.2 (H) 03/10/2020    CT ABDOMEN PELVIS W CONTRAST  Result Date: 01/21/2020 CLINICAL DATA:  Lower abdominal pain extending into the groin for 3 days. Scrotal soft tissue infection. Query CT findings of Fournier's gangrene. EXAM: CT ABDOMEN AND PELVIS WITH CONTRAST TECHNIQUE: Multidetector CT imaging of the abdomen and pelvis was performed using the standard protocol following bolus administration of intravenous contrast. CONTRAST:  141mL OMNIPAQUE IOHEXOL 300 MG/ML  SOLN COMPARISON:  Scrotal ultrasound of 01/21/2020 and CT abdomen from 03/25/2019 FINDINGS: Lower chest: Unremarkable Hepatobiliary: Punctate calcification posteriorly in the right hepatic lobe compatible with remote inflammatory focus. No biliary dilatation. Pancreas: Unremarkable Spleen: Unremarkable Adrenals/Urinary Tract: Two nonobstructive left renal calculi are present, the larger measuring 0.5 cm in long axis. A hypodense left mid kidney lesion anteriorly measures 1.0 cm in diameter and is technically nonspecific due to small size, although statistically likely to be benign. A similar nonspecific hypodense 0.8 by 1.2 cm lesion is present posteriorly in the right kidney lower pole (image 24/12). Both adrenal glands appear normal. Stomach/Bowel: Prominent stool throughout the colon favors  constipation. No dilated small bowel. Vascular/Lymphatic: Iliac and common femoral atherosclerotic calcification noted. Left external iliac node borderline prominent at 1.0 cm in diameter on image 91/2, previously measuring 1.1 cm in diameter on 03/25/2019. Reproductive: Unchanged appearance the prostate gland. I do not observe normal edema or gas in the perineum. Small bilateral scrotal hydroceles are present. There appears to be cutaneous thickening in the scrotum, left greater than right. No obvious gas tracking within the soft tissues of the scrotum. No well-defined abscess. No penile soft tissue gas is observed. Other: No supplemental non-categorized findings. Musculoskeletal: 1.4 cm sclerotic lesion centrally in the L3 vertebral body is nonspecific but unchanged from 06/11/2018 and likely benign. IMPRESSION: 1. Skin thickening in the scrotum, left greater than right, with small bilateral scrotal hydroceles. No gas tracking within the soft tissues of the scrotum, penis, or perineal soft tissues. No well-defined abscess. Please note that while today's imaging does not show specific imaging findings of Fournier's gangrene, if there is clear ulceration, necrosis, or other cutaneous/clinical findings of soft tissue breakdown then urgent surgical consultation would still be warranted. 2. Prominent stool throughout the colon favors constipation. 3. Two nonobstructive left renal calculi. 4. Borderline prominent left external iliac node, likely reactive but not increased from prior exam. 5. Aortic atherosclerosis. Aortic Atherosclerosis (ICD10-I70.0). Electronically Signed   By: Van Clines M.D.   On: 01/21/2020 11:20   US SCROTUM W/DOPPLER  Result Date: 01/21/2020 CLINICAL DATA:  Left testicular pain. Left groin and abdominal pain. EXAM: SCROTAL ULTRASOUND DOPPLER ULTRASOUND OF THE TESTICLES TECHNIQUE: Complete ultrasound examination of the testicles, epididymis, and other scrotal structures was performed.  Color and spectral Doppler ultrasound were also utilized to evaluate blood flow to the testicles. COMPARISON:  03/25/2019. FINDINGS: Right testicle Measurements: 3.9 x 2.2 x 2.4 cm. No mass or microlithiasis visualized. Left testicle Measurements: 3.7 x 2.3 x 2.8 cm. No mass or microlithiasis visualized. Scrotal skin thickening. Right epididymis:  Normal in size and appearance. Left epididymis: The left epididymis is prominent  suggesting epididymitis. Hydrocele: Small bilateral Varicocele: None IMPRESSION: 1. Left scrotal skin thickening suggesting cellulitis. 2. Left epididymis is prominent suggesting epididymitis. No evidence of testicular mass or torsion. Electronically Signed   By: Marcello Moores  Register   On: 01/21/2020 10:41     Assessment & Plan:  Plan  I have discontinued Nechemia Wachob's amLODipine. I am also having him start on losartan, tamsulosin, nystatin, and cephALEXin. Additionally, I am having him maintain his Insulin Lispro Prot & Lispro, clotrimazole, pravastatin, gabapentin, and HYDROcodone-acetaminophen.  Meds ordered this encounter  Medications  . pravastatin (PRAVACHOL) 40 MG tablet    Sig: Take 1 tablet (40 mg total) by mouth daily.    Dispense:  30 tablet    Refill:  4  . losartan (COZAAR) 50 MG tablet    Sig: Take 1 tablet (50 mg total) by mouth daily.    Dispense:  30 tablet    Refill:  2  . gabapentin (NEURONTIN) 300 MG capsule    Sig: Take 1 capsule (300 mg total) by mouth 3 (three) times daily for 14 days.    Dispense:  42 capsule    Refill:  0  . tamsulosin (FLOMAX) 0.4 MG CAPS capsule    Sig: Take 1 capsule (0.4 mg total) by mouth daily.    Dispense:  90 capsule    Refill:  3  . nystatin (NYSTATIN) powder    Sig: Apply 1 application topically 3 (three) times daily.    Dispense:  15 g    Refill:  0  . cephALEXin (KEFLEX) 500 MG capsule    Sig: Take 1 capsule (500 mg total) by mouth 2 (two) times daily.    Dispense:  20 capsule    Refill:  0  .  HYDROcodone-acetaminophen (NORCO) 5-325 MG tablet    Sig: Take 1-2 tablets by mouth every 6 (six) hours as needed.    Dispense:  12 tablet    Refill:  0    Problem List Items Addressed This Visit      Unprioritized   Acute pain of right shoulder    Check xrays       Relevant Medications   HYDROcodone-acetaminophen (NORCO) 5-325 MG tablet   Other Relevant Orders   DG Shoulder Right (Completed)   Balance problem    Due to neuropathy-- neuro referral placed  Pt had been seeing them prior to losing his insurance      Relevant Orders   Ambulatory referral to Neurology   Cellulitis of left groin    abx per orders       Relevant Medications   cephALEXin (KEFLEX) 500 MG capsule   Other Relevant Orders   POCT Urinalysis Dipstick (Automated)   Diabetes mellitus, type II (HCC) - Primary (Chronic)   Relevant Medications   pravastatin (PRAVACHOL) 40 MG tablet   losartan (COZAAR) 50 MG tablet   gabapentin (NEURONTIN) 300 MG capsule   Other Relevant Orders   Lipid panel (Completed)   Hemoglobin A1c (Completed)   CBC with Differential/Platelet (Completed)   TSH (Completed)   Vitamin B12 (Completed)   Comprehensive metabolic panel (Completed)   Ambulatory referral to Neurology   Ambulatory referral to Ophthalmology   POCT Urinalysis Dipstick (Automated)   Microalbumin / creatinine urine ratio (Completed)   Dyslipidemia (Chronic)    Encouraged heart healthy diet, increase exercise, avoid trans fats, consider a krill oil cap daily Restart statin      Relevant Medications   pravastatin (PRAVACHOL) 40 MG tablet  HTN (hypertension) (Chronic)   Relevant Medications   pravastatin (PRAVACHOL) 40 MG tablet   losartan (COZAAR) 50 MG tablet   Other Relevant Orders   Lipid panel (Completed)   Hemoglobin A1c (Completed)   CBC with Differential/Platelet (Completed)   TSH (Completed)   Vitamin B12 (Completed)   Comprehensive metabolic panel (Completed)   Ambulatory referral to  Neurology   HTN (hypertension), benign (Chronic)    Poorly controlled will alter medications, encouraged DASH diet, minimize caffeine and obtain adequate sleep. Report concerning symptoms and follow up as directed and as needed Pt has not been taking his meds===  Start losartan        Relevant Medications   pravastatin (PRAVACHOL) 40 MG tablet   losartan (COZAAR) 50 MG tablet   Sleep apnea, on C-pap (Chronic)    F/u pulmonary      Relevant Orders   Ambulatory referral to Pulmonology   Tinea cruris    abx per orders and nystatin powder  F/u prn      Relevant Medications   nystatin (NYSTATIN) powder   cephALEXin (KEFLEX) 500 MG capsule    Other Visit Diagnoses    Hyperlipidemia associated with type 2 diabetes mellitus ([place])       Relevant Medications   pravastatin (PRAVACHOL) 40 MG tablet   losartan (COZAAR) 50 MG tablet   Other Relevant Orders   Lipid panel (Completed)   Hemoglobin A1c (Completed)   CBC with Differential/Platelet (Completed)   TSH (Completed)   Vitamin B12 (Completed)   Comprehensive metabolic panel (Completed)   Ambulatory referral to Neurology   BPH with urinary obstruction       Relevant Medications   tamsulosin (FLOMAX) 0.4 MG CAPS capsule   Other Relevant Orders   Ambulatory referral to Urology   PSA (Completed)   Uncontrolled type 2 diabetes mellitus with hyperglycemia ([place])       Relevant Medications   pravastatin (PRAVACHOL) 40 MG tablet   losartan (COZAAR) 50 MG tablet   Other Relevant Orders   Ambulatory referral to Endocrinology   Neck pain       Relevant Medications   HYDROcodone-acetaminophen (NORCO) 5-325 MG tablet   Other Relevant Orders   DG Cervical Spine Complete (Completed)      Follow-up: Return in about 3 months (around 06/10/2020) for fasting, hypertension, hyperlipidemia, diabetes II.  [name], DO

## 2020-03-10 NOTE — Patient Instructions (Signed)
All meds sent to pharmacy  Referrals place to re establish pt  Labs ordered     Jock Itch Jock itch (tinea cruris) is an infection of the skin in the groin area. It is caused by a fungus, which is a type of germ that lives in dark, damp places. Jock itch causes an itchy rash in the groin and upper thigh area. It usually goes away in 2-3 weeks with treatment. What are the causes? The fungus that causes jock itch may be spread by:  Touching a fungus infection elsewhere on your body, such as athlete's foot, and then touching your groin area.  Sharing towels or clothing, such as socks or shoes, with someone who has a fungal infection. What increases the risk? Jock itch is most common in men and adolescent boys. You are also more likely to develop the condition if you:  Are in a hot, humid climate.  Wear tight-fitting clothing or wet bathing suits for long periods of time.  Play sports.  Are overweight.  Have diabetes.  Have a weakened immune system.  Sweat a lot. What are the signs or symptoms? Symptoms of jock itch may include:  A red, pink, or brown rash in the groin area. Blisters may be present. The rash may spread to the thighs, anus, and buttocks.  Dry and scaly skin on or around the rash.  Itchiness. How is this diagnosed? In most cases, your health care provider can make the diagnosis by looking at your rash. In some cases, a sample of infected skin may be scraped off. This sample may be examined under a microscope (biopsy) or by trying to grow the fungus from the sample (culture). How is this treated? Treatment for this condition may include:  Antifungal medicine to kill the fungus. This may be a skin cream, ointment, or powder, or it may be a medicine that you take by mouth.  Skin cream or ointment to reduce itching.  Lifestyle changes, such as wearing looser clothing and caring for your skin. Follow these instructions at home: Skin care  Apply skin creams,  ointments, or powders exactly as told by your health care provider.  Wear loose-fitting clothing that does not rub against your groin area. Men should wear boxer shorts or loose-fitting underwear.  Keep your groin area clean and dry. ? Change your underwear every day. ? Change out of wet bathing suits as soon as possible. ? After bathing, use a separate towel to dry your groin area thoroughly and gently. Using a separate towel will help prevent spreading the infection to other areas of your body.  Avoid hot baths and showers. Hot water can make itching worse.  Do not scratch the affected area. General instructions  Take and apply over-the-counter and prescription medicines only as told by your health care provider.  Do not share towels, clothing, or personal items with other people.  Wash your hands often with soap and water, especially after touching your groin area. If soap and water are not available, use alcohol-based hand sanitizer. Contact a health care provider if:  Your rash: ? Gets worse or does not get better after 2 weeks of treatment. ? Spreads. ? Returns after treatment is finished.  You have any of the following: ? A fever. ? New or worsening redness, swelling, or pain around your rash. ? Fluid, blood, or pus coming from your rash. Summary  Jock itch (tinea cruris) is a fungal infection of the skin in the groin area.  The fungus can be spread by sharing clothing or by touching a fungus infection elsewhere on your body and then touching your groin area.  Treatment may include antifungal medicine and lifestyle changes, such as keeping the area clean and dry. This information is not intended to replace advice given to you by your health care provider. Make sure you discuss any questions you have with your health care provider. Document Revised: 07/19/2017 Document Reviewed: 07/17/2017 Elsevier Patient Education  2020 [employer].

## 2020-03-11 ENCOUNTER — Other Ambulatory Visit: Payer: Self-pay

## 2020-03-11 ENCOUNTER — Encounter: Payer: Self-pay | Admitting: Family Medicine

## 2020-03-11 DIAGNOSIS — M25511 Pain in right shoulder: Secondary | ICD-10-CM | POA: Insufficient documentation

## 2020-03-11 DIAGNOSIS — L03314 Cellulitis of groin: Secondary | ICD-10-CM | POA: Insufficient documentation

## 2020-03-11 DIAGNOSIS — B356 Tinea cruris: Secondary | ICD-10-CM | POA: Insufficient documentation

## 2020-03-11 DIAGNOSIS — R2689 Other abnormalities of gait and mobility: Secondary | ICD-10-CM | POA: Insufficient documentation

## 2020-03-11 MED ORDER — METHOCARBAMOL 500 MG PO TABS: 500.0000 mg | ORAL_TABLET | Freq: Four times a day (QID) | ORAL | 0 refills | Status: DC

## 2020-03-11 NOTE — Assessment & Plan Note (Signed)
Poorly controlled will alter medications, encouraged DASH diet, minimize caffeine and obtain adequate sleep. Report concerning symptoms and follow up as directed and as needed Pt has not been taking his meds===  Start losartan

## 2020-03-11 NOTE — Assessment & Plan Note (Signed)
F/u pulmonary  

## 2020-03-11 NOTE — Assessment & Plan Note (Signed)
Due to neuropathy-- neuro referral placed  Pt had been seeing them prior to losing his insurance

## 2020-03-11 NOTE — Assessment & Plan Note (Signed)
Check xrays   

## 2020-03-11 NOTE — Assessment & Plan Note (Addendum)
abx per orders and nystatin powder  F/u prn

## 2020-03-11 NOTE — Assessment & Plan Note (Signed)
abx per orders  

## 2020-03-11 NOTE — Assessment & Plan Note (Signed)
Encouraged heart healthy diet, increase exercise, avoid trans fats, consider a krill oil cap daily Restart statin

## 2020-03-20 ENCOUNTER — Other Ambulatory Visit: Payer: Self-pay | Admitting: Family Medicine

## 2020-03-20 DIAGNOSIS — E1140 Type 2 diabetes mellitus with diabetic neuropathy, unspecified: Secondary | ICD-10-CM

## 2020-03-21 NOTE — Telephone Encounter (Signed)
Pharmacy is requesting a refill on this medication for the patient.   Last office visit- 03-10-2020

## 2020-03-21 NOTE — Telephone Encounter (Signed)
I m pretty sure I refilled it that day

## 2020-03-24 ENCOUNTER — Encounter: Payer: Self-pay | Admitting: Neurology

## 2020-04-14 ENCOUNTER — Other Ambulatory Visit: Payer: Self-pay

## 2020-04-14 ENCOUNTER — Encounter (HOSPITAL_BASED_OUTPATIENT_CLINIC_OR_DEPARTMENT_OTHER): Payer: Self-pay | Admitting: Emergency Medicine

## 2020-04-14 ENCOUNTER — Emergency Department (HOSPITAL_BASED_OUTPATIENT_CLINIC_OR_DEPARTMENT_OTHER)
Admission: EM | Admit: 2020-04-14 | Discharge: 2020-04-14 | Disposition: A | Discharge status: Home / Self Care | Payer: Medicare HMO | Attending: Emergency Medicine | Admitting: Emergency Medicine

## 2020-04-14 DIAGNOSIS — I2510 Atherosclerotic heart disease of native coronary artery without angina pectoris: Secondary | ICD-10-CM | POA: Insufficient documentation

## 2020-04-14 DIAGNOSIS — F17210 Nicotine dependence, cigarettes, uncomplicated: Secondary | ICD-10-CM | POA: Diagnosis not present

## 2020-04-14 DIAGNOSIS — I10 Essential (primary) hypertension: Secondary | ICD-10-CM | POA: Insufficient documentation

## 2020-04-14 DIAGNOSIS — Z794 Long term (current) use of insulin: Secondary | ICD-10-CM | POA: Diagnosis not present

## 2020-04-14 DIAGNOSIS — Z79899 Other long term (current) drug therapy: Secondary | ICD-10-CM | POA: Insufficient documentation

## 2020-04-14 DIAGNOSIS — N451 Epididymitis: Secondary | ICD-10-CM | POA: Diagnosis not present

## 2020-04-14 DIAGNOSIS — E119 Type 2 diabetes mellitus without complications: Secondary | ICD-10-CM | POA: Insufficient documentation

## 2020-04-14 DIAGNOSIS — R69 Illness, unspecified: Secondary | ICD-10-CM | POA: Diagnosis not present

## 2020-04-14 DIAGNOSIS — N50812 Left testicular pain: Secondary | ICD-10-CM | POA: Diagnosis present

## 2020-04-14 LAB — URINALYSIS, ROUTINE W REFLEX MICROSCOPIC
Bilirubin Urine: NEGATIVE
Glucose, UA: >=500 mg/dL — AB
Hgb urine dipstick: NEGATIVE
Ketones, ur: NEGATIVE mg/dL
Leukocytes,Ua: NEGATIVE
Nitrite: NEGATIVE
Protein, ur: NEGATIVE mg/dL
Specific Gravity, Urine: 1.015 (ref 1.005–1.030)
pH: 5 (ref 5.0–8.0)

## 2020-04-14 LAB — URINALYSIS, MICROSCOPIC (REFLEX): RBC / HPF: NONE SEEN RBC/hpf (ref 0–5)

## 2020-04-14 MED ORDER — HYDROCODONE-ACETAMINOPHEN 5-325 MG PO TABS: 1.0000 | ORAL_TABLET | Freq: Four times a day (QID) | ORAL | 0 refills | Status: DC | PRN

## 2020-04-14 MED ORDER — LEVOFLOXACIN 500 MG PO TABS
500.0000 mg | ORAL_TABLET | Freq: Once | ORAL | Status: AC
  Administered 2020-04-14: 500 mg via ORAL
  Filled 2020-04-14: qty 1

## 2020-04-14 MED ORDER — LEVOFLOXACIN 500 MG PO TABS
ORAL_TABLET | ORAL | 0 refills | Status: DC
Start: 2020-04-14 — End: 2020-07-05

## 2020-04-14 NOTE — ED Provider Notes (Signed)
[facility] Provider Note: Georgena Spurling, MD, FACEP  CSN: [id number] MRN: [id number] ARRIVAL: 04/14/20 at [place]: [place]  Testicle pain   HISTORY OF PRESENT ILLNESS  04/14/20 5:39 AM Tyler Brewer is a 59 y.o. male who has had recurrent left testicle pain for the past 6 months.  He has been treated in the past for epididymitis as well as scrotal candidiasis.  Scrotal ultrasound on 01/21/2020 showed left epididymal prominence suggesting epididymitis.  He is here with about 2 days of new pain in the left testicle.  He describes it as feeling like an electric shock shooting from his left testicle upwards into his abdomen.  He rates the pain as a 5 out of 10, worse with movement or palpation of the left testicle.  He denies dysuria.  He has urology follow-up scheduled in October of this year.   Past Medical History:  Diagnosis Date  . Arthritis   . CAD (coronary artery disease)  cardiac cath with moderate disease in a septal branch of the ramus intermedius 04/01/2012  . Depression   . Diabetes mellitus    poorly controlled by his report  . History of narcotic addiction ([place])    past history of back pain  . Hypercholesteremia   . Hypertension   . IBS (irritable bowel syndrome)   . 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 Neuropathy   . Obesity    Max weight was 390  . OSA on CPAP   . Panic attacks   . Testosterone deficiency   . Vertigo     Past Surgical History:  Procedure Laterality Date  . CARDIAC CATHETERIZATION    . LEFT HEART CATHETERIZATION WITH CORONARY ANGIOGRAM N/A 03/31/2012   Procedure: LEFT HEART CATHETERIZATION WITH CORONARY ANGIOGRAM;  Surgeon: Leonie Man, MD;  Location: [facility] CATH LAB;  Service: Cardiovascular;  Laterality: N/A;    Family History  Problem Relation Age of Onset  . Diabetes type II Father   . Hypertension Father   . Pancreatic disease Father 76       Deceased  . Healthy Mother   . Healthy Sister   .  Healthy Son   . Healthy Daughter     Social History   Tobacco Use  . Smoking status: Current Every Day Smoker    Packs/day: 0.50    Types: Cigarettes  . Smokeless tobacco: Never Used  Vaping Use  . Vaping Use: Never used  Substance Use Topics  . Alcohol use: No    Alcohol/week: 0.0 standard drinks  . Drug use: Not Currently    Types: Methamphetamines    Prior to Admission medications   Medication Sig Start Date End Date Taking? Authorizing Provider  gabapentin (NEURONTIN) 300 MG capsule Take 1 capsule (300 mg total) by mouth 3 (three) times daily for 14 days. 03/10/20 03/24/20  [name], DO  HYDROcodone-acetaminophen (NORCO) 5-325 MG tablet Take 1 tablet by mouth every 6 (six) hours as needed for severe pain. 04/14/20   Valary Manahan, MD  Insulin Lispro Prot & Lispro (HUMALOG MIX 75/25 KWIKPEN) (75-25) 100 UNIT/ML Kwikpen Inject 10 Units into the skin 2 (two) times daily. Patient taking differently: Inject 35 Units into the skin 2 (two) times daily.  05/02/17   [name], DO  levofloxacin (LEVAQUIN) 500 MG tablet Take 1 tablet daily starting Friday, 04/15/2020. 04/14/20   Taggart Prasad, MD  losartan (COZAAR) 50 MG tablet Take 1 tablet (50 mg total)  by mouth daily. 03/10/20   [name], DO  nystatin (NYSTATIN) powder Apply 1 application topically 3 (three) times daily. 03/10/20   [name], DO  pravastatin (PRAVACHOL) 40 MG tablet Take 1 tablet (40 mg total) by mouth daily. 03/10/20   [name], DO  tamsulosin (FLOMAX) 0.4 MG CAPS capsule Take 1 capsule (0.4 mg total) by mouth daily. 03/10/20   [name], DO    Allergies Gabapentin   REVIEW OF SYSTEMS  Negative except as noted here or in the History of Present Illness.   PHYSICAL EXAMINATION  Initial Vital Signs Blood pressure (!) 185/104, pulse (!) 107, temperature 98.1 F (36.7 C), temperature source Oral, resp. rate 20, height 5\' 11"  (1.803 m), weight 131.5 kg,  SpO2 100 %.  Examination General: Well-developed, well-nourished male in no acute distress; appearance consistent with age of record HENT: normocephalic; atraumatic Eyes: Normal appearance Neck: supple Heart: regular rate and rhythm Lungs: clear to auscultation bilaterally Abdomen: soft; nondistended; nontender; bowel sounds present GU: Tanner V male, uncircumcised; left testicular enlargement and tenderness; no scrotal edema, erythema, warmth or crepitus; no skin changes of groin suggestive of candidiasis Extremities: No deformity; full range of motion; pulses normal Neurologic: Awake, alert and oriented; motor function intact in all extremities and symmetric; no facial droop Skin: Warm and dry Psychiatric: Normal mood and affect   RESULTS  Summary of this visit's results, reviewed and interpreted by myself:   EKG Interpretation  Date/Time:    Ventricular Rate:    PR Interval:    QRS Duration:   QT Interval:    QTC Calculation:   R Axis:     Text Interpretation:        Laboratory Studies: Results for orders placed or performed during the hospital encounter of 04/14/20 (from the past 24 hour(s))  Urinalysis, Routine w reflex microscopic Urine, Clean Catch     Status: Abnormal   Collection Time: 04/14/20  6:17 AM  Result Value Ref Range   Color, Urine YELLOW YELLOW   APPearance CLEAR CLEAR   Specific Gravity, Urine 1.015 1.005 - 1.030   pH 5.0 5.0 - 8.0   Glucose, UA >=500 (A) NEGATIVE mg/dL   Hgb urine dipstick NEGATIVE NEGATIVE   Bilirubin Urine NEGATIVE NEGATIVE   Ketones, ur NEGATIVE NEGATIVE mg/dL   Protein, ur NEGATIVE NEGATIVE mg/dL   Nitrite NEGATIVE NEGATIVE   Leukocytes,Ua NEGATIVE NEGATIVE  Urinalysis, Microscopic (reflex)     Status: Abnormal   Collection Time: 04/14/20  6:17 AM  Result Value Ref Range   RBC / HPF NONE SEEN 0 - 5 RBC/hpf   WBC, UA 0-5 0 - 5 WBC/hpf   Bacteria, UA RARE (A) NONE SEEN   Squamous Epithelial / LPF 0-5 0 - 5   Hyaline  Casts, UA PRESENT    Granular Casts, UA PRESENT    Imaging Studies: No results found.  ED COURSE and MDM  Nursing notes, initial and subsequent vitals signs, including pulse oximetry, reviewed and interpreted by myself.  Vitals:   04/14/20 0536 04/14/20 0537  BP: (!) 185/104   Pulse: (!) 107   Resp: 20   Temp: 98.1 F (36.7 C)   TempSrc: Oral   SpO2: 100%   Weight:  131.5 kg  Height:  5\' 11"  (1.803 m)   Medications  levofloxacin (LEVAQUIN) tablet 500 mg (has no administration in time range)    6:36 AM Urine sent for culture and CNS as well  as GC and Chlamydia testing.  Examination is consistent with epididymitis on the left.  The epididymis is more prominent than when I previously examined him 01/07/2020.  We will treat with a 10-day course of Levaquin.  As noted above patient has urology follow-up scheduled in October.  PROCEDURES  Procedures   ED DIAGNOSES     ICD-10-CM   1. Left epididymitis  N45.1        Yaakov Saindon, Jenny Reichmann, MD 04/14/20 [phone]

## 2020-04-14 NOTE — ED Triage Notes (Signed)
  Patient comes in for L testicle pain that has been on and off for the past 6 months.  Patient states he was seen for the same in June and told he had a yeast infection.  Patient states pain 5/10 and radiates from L testicle to L hip.

## 2020-04-15 LAB — GC/CHLAMYDIA PROBE AMP (~~LOC~~) NOT AT ARMC
Chlamydia: NEGATIVE
Comment: NEGATIVE
Comment: NORMAL
Neisseria Gonorrhea: NEGATIVE

## 2020-04-15 LAB — URINE CULTURE: Culture: NO GROWTH

## 2020-04-21 ENCOUNTER — Emergency Department (HOSPITAL_BASED_OUTPATIENT_CLINIC_OR_DEPARTMENT_OTHER): Payer: Medicare HMO

## 2020-04-21 ENCOUNTER — Encounter (HOSPITAL_BASED_OUTPATIENT_CLINIC_OR_DEPARTMENT_OTHER): Payer: Self-pay | Admitting: Emergency Medicine

## 2020-04-21 ENCOUNTER — Other Ambulatory Visit: Payer: Self-pay

## 2020-04-21 ENCOUNTER — Emergency Department (HOSPITAL_BASED_OUTPATIENT_CLINIC_OR_DEPARTMENT_OTHER)
Admission: EM | Admit: 2020-04-21 | Discharge: 2020-04-21 | Disposition: A | Discharge status: Home / Self Care | Payer: Medicare HMO | Attending: Emergency Medicine | Admitting: Emergency Medicine

## 2020-04-21 DIAGNOSIS — Z794 Long term (current) use of insulin: Secondary | ICD-10-CM | POA: Insufficient documentation

## 2020-04-21 DIAGNOSIS — Z041 Encounter for examination and observation following transport accident: Secondary | ICD-10-CM | POA: Diagnosis not present

## 2020-04-21 DIAGNOSIS — Y92410 Unspecified street and highway as the place of occurrence of the external cause: Secondary | ICD-10-CM | POA: Insufficient documentation

## 2020-04-21 DIAGNOSIS — Z79899 Other long term (current) drug therapy: Secondary | ICD-10-CM | POA: Insufficient documentation

## 2020-04-21 DIAGNOSIS — Y9389 Activity, other specified: Secondary | ICD-10-CM | POA: Diagnosis not present

## 2020-04-21 DIAGNOSIS — E1140 Type 2 diabetes mellitus with diabetic neuropathy, unspecified: Secondary | ICD-10-CM | POA: Insufficient documentation

## 2020-04-21 DIAGNOSIS — Y999 Unspecified external cause status: Secondary | ICD-10-CM | POA: Insufficient documentation

## 2020-04-21 DIAGNOSIS — I2510 Atherosclerotic heart disease of native coronary artery without angina pectoris: Secondary | ICD-10-CM | POA: Diagnosis not present

## 2020-04-21 DIAGNOSIS — S2220XA Unspecified fracture of sternum, initial encounter for closed fracture: Secondary | ICD-10-CM | POA: Diagnosis not present

## 2020-04-21 DIAGNOSIS — I10 Essential (primary) hypertension: Secondary | ICD-10-CM | POA: Insufficient documentation

## 2020-04-21 DIAGNOSIS — F17210 Nicotine dependence, cigarettes, uncomplicated: Secondary | ICD-10-CM | POA: Diagnosis not present

## 2020-04-21 DIAGNOSIS — M5134 Other intervertebral disc degeneration, thoracic region: Secondary | ICD-10-CM | POA: Diagnosis not present

## 2020-04-21 DIAGNOSIS — S20219A Contusion of unspecified front wall of thorax, initial encounter: Secondary | ICD-10-CM | POA: Diagnosis not present

## 2020-04-21 DIAGNOSIS — J9811 Atelectasis: Secondary | ICD-10-CM | POA: Diagnosis not present

## 2020-04-21 DIAGNOSIS — R0781 Pleurodynia: Secondary | ICD-10-CM | POA: Diagnosis not present

## 2020-04-21 DIAGNOSIS — S29001A Unspecified injury of muscle and tendon of front wall of thorax, initial encounter: Secondary | ICD-10-CM | POA: Diagnosis present

## 2020-04-21 DIAGNOSIS — R69 Illness, unspecified: Secondary | ICD-10-CM | POA: Diagnosis not present
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
## 2020-04-21 MED ORDER — HYDROCODONE-ACETAMINOPHEN 5-325 MG PO TABS: 1.0000 | ORAL_TABLET | Freq: Once | ORAL | Status: DC
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
## 2020-04-21 NOTE — Discharge Instructions (Signed)
If you develop recurrent, continued, or worsening chest pain, shortness of breath, fever, vomiting, abdominal or back pain, or any other new/concerning symptoms then return to the ER for evaluation.  
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
## 2020-04-29 ENCOUNTER — Encounter: Payer: Self-pay | Admitting: Neurology

## 2020-04-29 ENCOUNTER — Other Ambulatory Visit: Payer: Self-pay

## 2020-04-29 ENCOUNTER — Ambulatory Visit: Payer: Medicare HMO | Admitting: Neurology

## 2020-04-29 VITALS — BP 178/100 | HR 86 | Ht 71.0 in | Wt 304.0 lb

## 2020-04-29 DIAGNOSIS — R55 Syncope and collapse: Secondary | ICD-10-CM

## 2020-04-29 DIAGNOSIS — E1140 Type 2 diabetes mellitus with diabetic neuropathy, unspecified: Secondary | ICD-10-CM

## 2020-04-29 DIAGNOSIS — R2689 Other abnormalities of gait and mobility: Secondary | ICD-10-CM | POA: Diagnosis not present

## 2020-04-29 MED ORDER — GABAPENTIN 300 MG PO CAPS: ORAL_CAPSULE | ORAL | 5 refills | Status: DC

## 2020-04-29 NOTE — Patient Instructions (Addendum)
Take gabapentin 600mg  in the morning, 300mg  in the afternoon, and 300mg  at bedtime  Start physical therapy for balance training  It is important to get your diabetes under better control  Follow-up with your primary care doctor for elevated blood pressure  Return to clinic in 6 months
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
## 2020-05-06 ENCOUNTER — Ambulatory Visit: Payer: Medicare HMO | Admitting: Neurology

## 2020-05-16 ENCOUNTER — Institutional Professional Consult (permissible substitution): Payer: Medicare HMO | Admitting: Pulmonary Disease

## 2020-05-30 ENCOUNTER — Other Ambulatory Visit: Payer: Self-pay

## 2020-05-30 ENCOUNTER — Encounter (HOSPITAL_BASED_OUTPATIENT_CLINIC_OR_DEPARTMENT_OTHER): Payer: Self-pay | Admitting: Emergency Medicine

## 2020-05-30 ENCOUNTER — Emergency Department (HOSPITAL_BASED_OUTPATIENT_CLINIC_OR_DEPARTMENT_OTHER): Payer: Medicare HMO

## 2020-05-30 ENCOUNTER — Emergency Department (HOSPITAL_BASED_OUTPATIENT_CLINIC_OR_DEPARTMENT_OTHER)
Admission: EM | Admit: 2020-05-30 | Discharge: 2020-05-31 | Disposition: A | Discharge status: Home / Self Care | Payer: Medicare HMO | Attending: Emergency Medicine | Admitting: Emergency Medicine

## 2020-05-30 DIAGNOSIS — Z7984 Long term (current) use of oral hypoglycemic drugs: Secondary | ICD-10-CM | POA: Diagnosis not present

## 2020-05-30 DIAGNOSIS — Z794 Long term (current) use of insulin: Secondary | ICD-10-CM | POA: Diagnosis not present

## 2020-05-30 DIAGNOSIS — R079 Chest pain, unspecified: Secondary | ICD-10-CM | POA: Diagnosis not present

## 2020-05-30 DIAGNOSIS — Z79899 Other long term (current) drug therapy: Secondary | ICD-10-CM | POA: Diagnosis not present

## 2020-05-30 DIAGNOSIS — E1140 Type 2 diabetes mellitus with diabetic neuropathy, unspecified: Secondary | ICD-10-CM | POA: Diagnosis not present

## 2020-05-30 DIAGNOSIS — R072 Precordial pain: Secondary | ICD-10-CM | POA: Insufficient documentation

## 2020-05-30 DIAGNOSIS — I2510 Atherosclerotic heart disease of native coronary artery without angina pectoris: Secondary | ICD-10-CM | POA: Insufficient documentation

## 2020-05-30 DIAGNOSIS — R0789 Other chest pain: Secondary | ICD-10-CM | POA: Diagnosis not present

## 2020-05-30 DIAGNOSIS — I712 Thoracic aortic aneurysm, without rupture, unspecified: Secondary | ICD-10-CM

## 2020-05-30 DIAGNOSIS — I119 Hypertensive heart disease without heart failure: Secondary | ICD-10-CM | POA: Insufficient documentation

## 2020-05-30 DIAGNOSIS — F17210 Nicotine dependence, cigarettes, uncomplicated: Secondary | ICD-10-CM | POA: Diagnosis not present

## 2020-05-30 DIAGNOSIS — I10 Essential (primary) hypertension: Secondary | ICD-10-CM | POA: Diagnosis not present

## 2020-05-30 DIAGNOSIS — S2222XS Fracture of body of sternum, sequela: Secondary | ICD-10-CM

## 2020-05-30 DIAGNOSIS — R69 Illness, unspecified: Secondary | ICD-10-CM | POA: Diagnosis not present

## 2020-05-30 IMAGING — DX DG CHEST 1V PORT
1 series · 1 of 1 positions shown · non-contrast
Comparison: [DATE]

CLINICAL DATA: Chest pain

EXAM:
PORTABLE CHEST 1 VIEW

[chest ap]
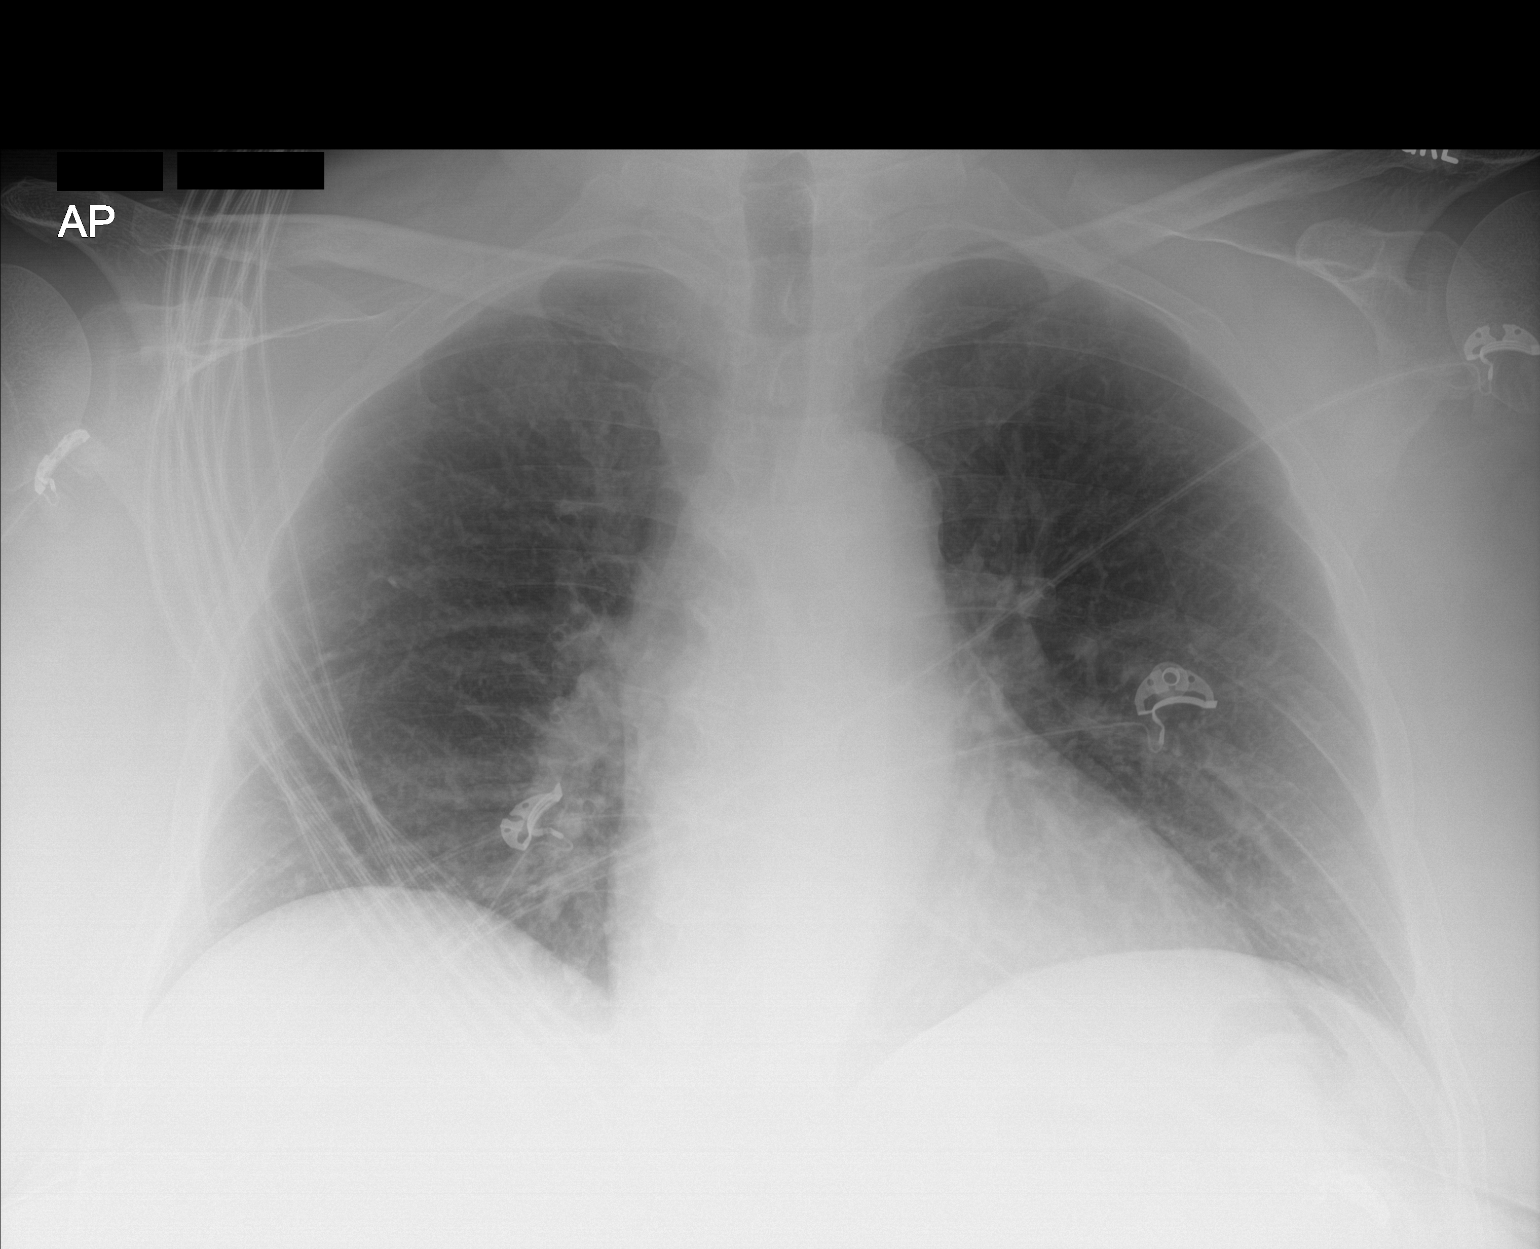

[1 of 1 positions shown; findings below may reference images not displayed]

FINDINGS: The heart size and mediastinal contours are within normal limits.
Both lungs are clear. The visualized skeletal structures are
unremarkable.
IMPRESSION: No active disease.

## 2020-05-30 NOTE — ED Triage Notes (Signed)
Patient presents via EMS with complaints of left side chest pain onset yesterday; states he woke up today with numbness in left arm as well. States increased weakness in lower extremities x 1 month.

## 2020-05-31 ENCOUNTER — Emergency Department (HOSPITAL_BASED_OUTPATIENT_CLINIC_OR_DEPARTMENT_OTHER): Payer: Medicare HMO

## 2020-05-31 ENCOUNTER — Encounter (HOSPITAL_BASED_OUTPATIENT_CLINIC_OR_DEPARTMENT_OTHER): Payer: Self-pay | Admitting: Radiology

## 2020-05-31 DIAGNOSIS — R0789 Other chest pain: Secondary | ICD-10-CM | POA: Diagnosis not present

## 2020-05-31 DIAGNOSIS — R079 Chest pain, unspecified: Secondary | ICD-10-CM | POA: Diagnosis not present

## 2020-05-31 LAB — CBC WITH DIFFERENTIAL/PLATELET
Abs Immature Granulocytes: 0.06 10*3/uL (ref 0.00–0.07)
Basophils Absolute: 0.1 10*3/uL (ref 0.0–0.1)
Basophils Relative: 1 %
Eosinophils Absolute: 0.3 10*3/uL (ref 0.0–0.5)
Eosinophils Relative: 5 %
HCT: 45.1 % (ref 39.0–52.0)
Hemoglobin: 15.2 g/dL (ref 13.0–17.0)
Immature Granulocytes: 1 %
Lymphocytes Relative: 35 %
Lymphs Abs: 2.5 10*3/uL (ref 0.7–4.0)
MCH: 29.4 pg (ref 26.0–34.0)
MCHC: 33.7 g/dL (ref 30.0–36.0)
MCV: 87.2 fL (ref 80.0–100.0)
Monocytes Absolute: 0.8 10*3/uL (ref 0.1–1.0)
Monocytes Relative: 11 %
Neutro Abs: 3.4 10*3/uL (ref 1.7–7.7)
Neutrophils Relative %: 47 %
Platelets: 266 10*3/uL (ref 150–400)
RBC: 5.17 MIL/uL (ref 4.22–5.81)
RDW: 12.4 % (ref 11.5–15.5)
WBC: 7.1 10*3/uL (ref 4.0–10.5)
nRBC: 0 % (ref 0.0–0.2)

## 2020-05-31 LAB — BASIC METABOLIC PANEL
Anion gap: 10 (ref 5–15)
BUN: 20 mg/dL (ref 6–20)
CO2: 26 mmol/L (ref 22–32)
Calcium: 9 mg/dL (ref 8.9–10.3)
Chloride: 95 mmol/L — ABNORMAL LOW (ref 98–111)
Creatinine, Ser: 1.17 mg/dL (ref 0.61–1.24)
GFR, Estimated: >60 mL/min (ref >60)
Glucose, Bld: 415 mg/dL — ABNORMAL HIGH (ref 70–99)
Potassium: 5 mmol/L (ref 3.5–5.1)
Sodium: 131 mmol/L — ABNORMAL LOW (ref 135–145)

## 2020-05-31 LAB — URINALYSIS, ROUTINE W REFLEX MICROSCOPIC
Bilirubin Urine: NEGATIVE
Glucose, UA: >=500 mg/dL — AB
Hgb urine dipstick: NEGATIVE
Ketones, ur: NEGATIVE mg/dL
Leukocytes,Ua: NEGATIVE
Nitrite: NEGATIVE
Protein, ur: NEGATIVE mg/dL
Specific Gravity, Urine: 1.01 (ref 1.005–1.030)
pH: 5.5 (ref 5.0–8.0)

## 2020-05-31 LAB — URINALYSIS, MICROSCOPIC (REFLEX): Bacteria, UA: NONE SEEN

## 2020-05-31 LAB — TROPONIN I (HIGH SENSITIVITY)
Troponin I (High Sensitivity): 16 ng/L (ref <18)
Troponin I (High Sensitivity): 16 ng/L (ref <18)
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
## 2020-05-31 MED ORDER — INSULIN REGULAR HUMAN 100 UNIT/ML IJ SOLN
5.0000 [IU] | Freq: Once | INTRAMUSCULAR | Status: DC
  Filled 2020-05-31: qty 3

## 2020-05-31 MED ORDER — AMLODIPINE BESYLATE 5 MG PO TABS
5.0000 mg | ORAL_TABLET | Freq: Once | ORAL | Status: AC
  Administered 2020-05-31: 5 mg via ORAL
  Filled 2020-05-31: qty 1

## 2020-05-31 MED ORDER — IOHEXOL 350 MG/ML SOLN
100.0000 mL | Freq: Once | INTRAVENOUS | Status: AC | PRN
  Administered 2020-05-31: 100 mL via INTRAVENOUS

## 2020-05-31 MED ORDER — INSULIN ASPART 100 UNIT/ML ~~LOC~~ SOLN
5.0000 [IU] | Freq: Once | SUBCUTANEOUS | Status: AC
  Administered 2020-05-31: 5 [IU] via SUBCUTANEOUS
  Filled 2020-05-31: qty 5

## 2020-05-31 MED ORDER — KETOROLAC TROMETHAMINE 30 MG/ML IJ SOLN
30.0000 mg | Freq: Once | INTRAMUSCULAR | Status: AC
  Administered 2020-05-31: 30 mg via INTRAVENOUS
  Filled 2020-05-31: qty 1

## 2020-05-31 MED ORDER — LIDOCAINE 5 % EX PTCH: 1.0000 | MEDICATED_PATCH | CUTANEOUS | 0 refills | Status: DC

## 2020-05-31 MED ORDER — IOHEXOL 350 MG/ML SOLN: 100.0000 mL | Freq: Once | INTRAVENOUS | Status: DC
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
## 2020-05-31 NOTE — ED Notes (Signed)
Patient transported to CT 

## 2020-06-06 ENCOUNTER — Other Ambulatory Visit: Payer: Self-pay

## 2020-06-06 ENCOUNTER — Emergency Department (HOSPITAL_COMMUNITY): Payer: Medicare HMO

## 2020-06-06 ENCOUNTER — Encounter (HOSPITAL_COMMUNITY): Payer: Self-pay | Admitting: Emergency Medicine

## 2020-06-06 ENCOUNTER — Emergency Department (HOSPITAL_COMMUNITY)
Admission: EM | Admit: 2020-06-06 | Discharge: 2020-06-06 | Disposition: A | Discharge status: Home / Self Care | Payer: Medicare HMO | Attending: Emergency Medicine | Admitting: Emergency Medicine

## 2020-06-06 DIAGNOSIS — Z20822 Contact with and (suspected) exposure to covid-19: Secondary | ICD-10-CM | POA: Diagnosis not present

## 2020-06-06 DIAGNOSIS — R69 Illness, unspecified: Secondary | ICD-10-CM | POA: Diagnosis not present

## 2020-06-06 DIAGNOSIS — E1165 Type 2 diabetes mellitus with hyperglycemia: Secondary | ICD-10-CM | POA: Insufficient documentation

## 2020-06-06 DIAGNOSIS — E1140 Type 2 diabetes mellitus with diabetic neuropathy, unspecified: Secondary | ICD-10-CM | POA: Insufficient documentation

## 2020-06-06 DIAGNOSIS — F17210 Nicotine dependence, cigarettes, uncomplicated: Secondary | ICD-10-CM | POA: Diagnosis not present

## 2020-06-06 DIAGNOSIS — R0602 Shortness of breath: Secondary | ICD-10-CM | POA: Diagnosis not present

## 2020-06-06 DIAGNOSIS — Z79899 Other long term (current) drug therapy: Secondary | ICD-10-CM | POA: Insufficient documentation

## 2020-06-06 DIAGNOSIS — I2510 Atherosclerotic heart disease of native coronary artery without angina pectoris: Secondary | ICD-10-CM | POA: Diagnosis not present

## 2020-06-06 DIAGNOSIS — I10 Essential (primary) hypertension: Secondary | ICD-10-CM | POA: Diagnosis not present

## 2020-06-06 DIAGNOSIS — R079 Chest pain, unspecified: Secondary | ICD-10-CM | POA: Diagnosis not present

## 2020-06-06 DIAGNOSIS — N451 Epididymitis: Secondary | ICD-10-CM | POA: Diagnosis not present

## 2020-06-06 DIAGNOSIS — R739 Hyperglycemia, unspecified: Secondary | ICD-10-CM

## 2020-06-06 DIAGNOSIS — N50812 Left testicular pain: Secondary | ICD-10-CM | POA: Diagnosis present

## 2020-06-06 DIAGNOSIS — Z955 Presence of coronary angioplasty implant and graft: Secondary | ICD-10-CM | POA: Diagnosis not present

## 2020-06-06 DIAGNOSIS — Z794 Long term (current) use of insulin: Secondary | ICD-10-CM | POA: Insufficient documentation

## 2020-06-06 DIAGNOSIS — R531 Weakness: Secondary | ICD-10-CM | POA: Diagnosis not present

## 2020-06-06 LAB — COMPREHENSIVE METABOLIC PANEL
ALT: 27 U/L (ref 0–44)
AST: 14 U/L — ABNORMAL LOW (ref 15–41)
Albumin: 3.9 g/dL (ref 3.5–5.0)
Alkaline Phosphatase: 110 U/L (ref 38–126)
Anion gap: 12 (ref 5–15)
BUN: 23 mg/dL — ABNORMAL HIGH (ref 6–20)
CO2: 24 mmol/L (ref 22–32)
Calcium: 9 mg/dL (ref 8.9–10.3)
Chloride: 95 mmol/L — ABNORMAL LOW (ref 98–111)
Creatinine, Ser: 1.14 mg/dL (ref 0.61–1.24)
GFR, Estimated: >60 mL/min (ref >60)
Glucose, Bld: 464 mg/dL — ABNORMAL HIGH (ref 70–99)
Potassium: 4.2 mmol/L (ref 3.5–5.1)
Sodium: 131 mmol/L — ABNORMAL LOW (ref 135–145)
Total Bilirubin: 1 mg/dL (ref 0.3–1.2)
Total Protein: 7.6 g/dL (ref 6.5–8.1)

## 2020-06-06 LAB — URINALYSIS, ROUTINE W REFLEX MICROSCOPIC
Bilirubin Urine: NEGATIVE
Glucose, UA: >=500 mg/dL — AB
Hgb urine dipstick: NEGATIVE
Ketones, ur: NEGATIVE mg/dL
Leukocytes,Ua: NEGATIVE
Nitrite: NEGATIVE
Protein, ur: NEGATIVE mg/dL
Specific Gravity, Urine: 1.026 (ref 1.005–1.030)
pH: 6 (ref 5.0–8.0)

## 2020-06-06 LAB — CBC WITH DIFFERENTIAL/PLATELET
Abs Immature Granulocytes: 0.14 10*3/uL — ABNORMAL HIGH (ref 0.00–0.07)
Basophils Absolute: 0.1 10*3/uL (ref 0.0–0.1)
Basophils Relative: 1 %
Eosinophils Absolute: 0.3 10*3/uL (ref 0.0–0.5)
Eosinophils Relative: 3 %
HCT: 49.8 % (ref 39.0–52.0)
Hemoglobin: 16.9 g/dL (ref 13.0–17.0)
Immature Granulocytes: 2 %
Lymphocytes Relative: 18 %
Lymphs Abs: 1.6 10*3/uL (ref 0.7–4.0)
MCH: 29.2 pg (ref 26.0–34.0)
MCHC: 33.9 g/dL (ref 30.0–36.0)
MCV: 86 fL (ref 80.0–100.0)
Monocytes Absolute: 0.7 10*3/uL (ref 0.1–1.0)
Monocytes Relative: 8 %
Neutro Abs: 5.9 10*3/uL (ref 1.7–7.7)
Neutrophils Relative %: 68 %
Platelets: 278 10*3/uL (ref 150–400)
RBC: 5.79 MIL/uL (ref 4.22–5.81)
RDW: 12.3 % (ref 11.5–15.5)
WBC: 8.7 10*3/uL (ref 4.0–10.5)
nRBC: 0 % (ref 0.0–0.2)

## 2020-06-06 LAB — CBG MONITORING, ED
Glucose-Capillary: 465 mg/dL — ABNORMAL HIGH (ref 70–99)
Glucose-Capillary: 351 mg/dL — ABNORMAL HIGH (ref 70–99)

## 2020-06-06 LAB — TROPONIN I (HIGH SENSITIVITY)
Troponin I (High Sensitivity): 16 ng/L (ref <18)
Troponin I (High Sensitivity): 13 ng/L (ref <18)

## 2020-06-06 LAB — RESPIRATORY PANEL BY RT PCR (FLU A&B, COVID)
Influenza A by PCR: NEGATIVE
Influenza B by PCR: NEGATIVE
SARS Coronavirus 2 by RT PCR: NEGATIVE

## 2020-06-06 LAB — D-DIMER, QUANTITATIVE: D-Dimer, Quant: <0.27 ug/mL-FEU (ref 0.00–0.50)

## 2020-06-06 IMAGING — CR DG CHEST 2V
2 series · 2 of 2 positions shown · non-contrast
Comparison: [DATE]

CLINICAL DATA: Weakness

EXAM:
CHEST - 2 VIEW

[w chest lat]
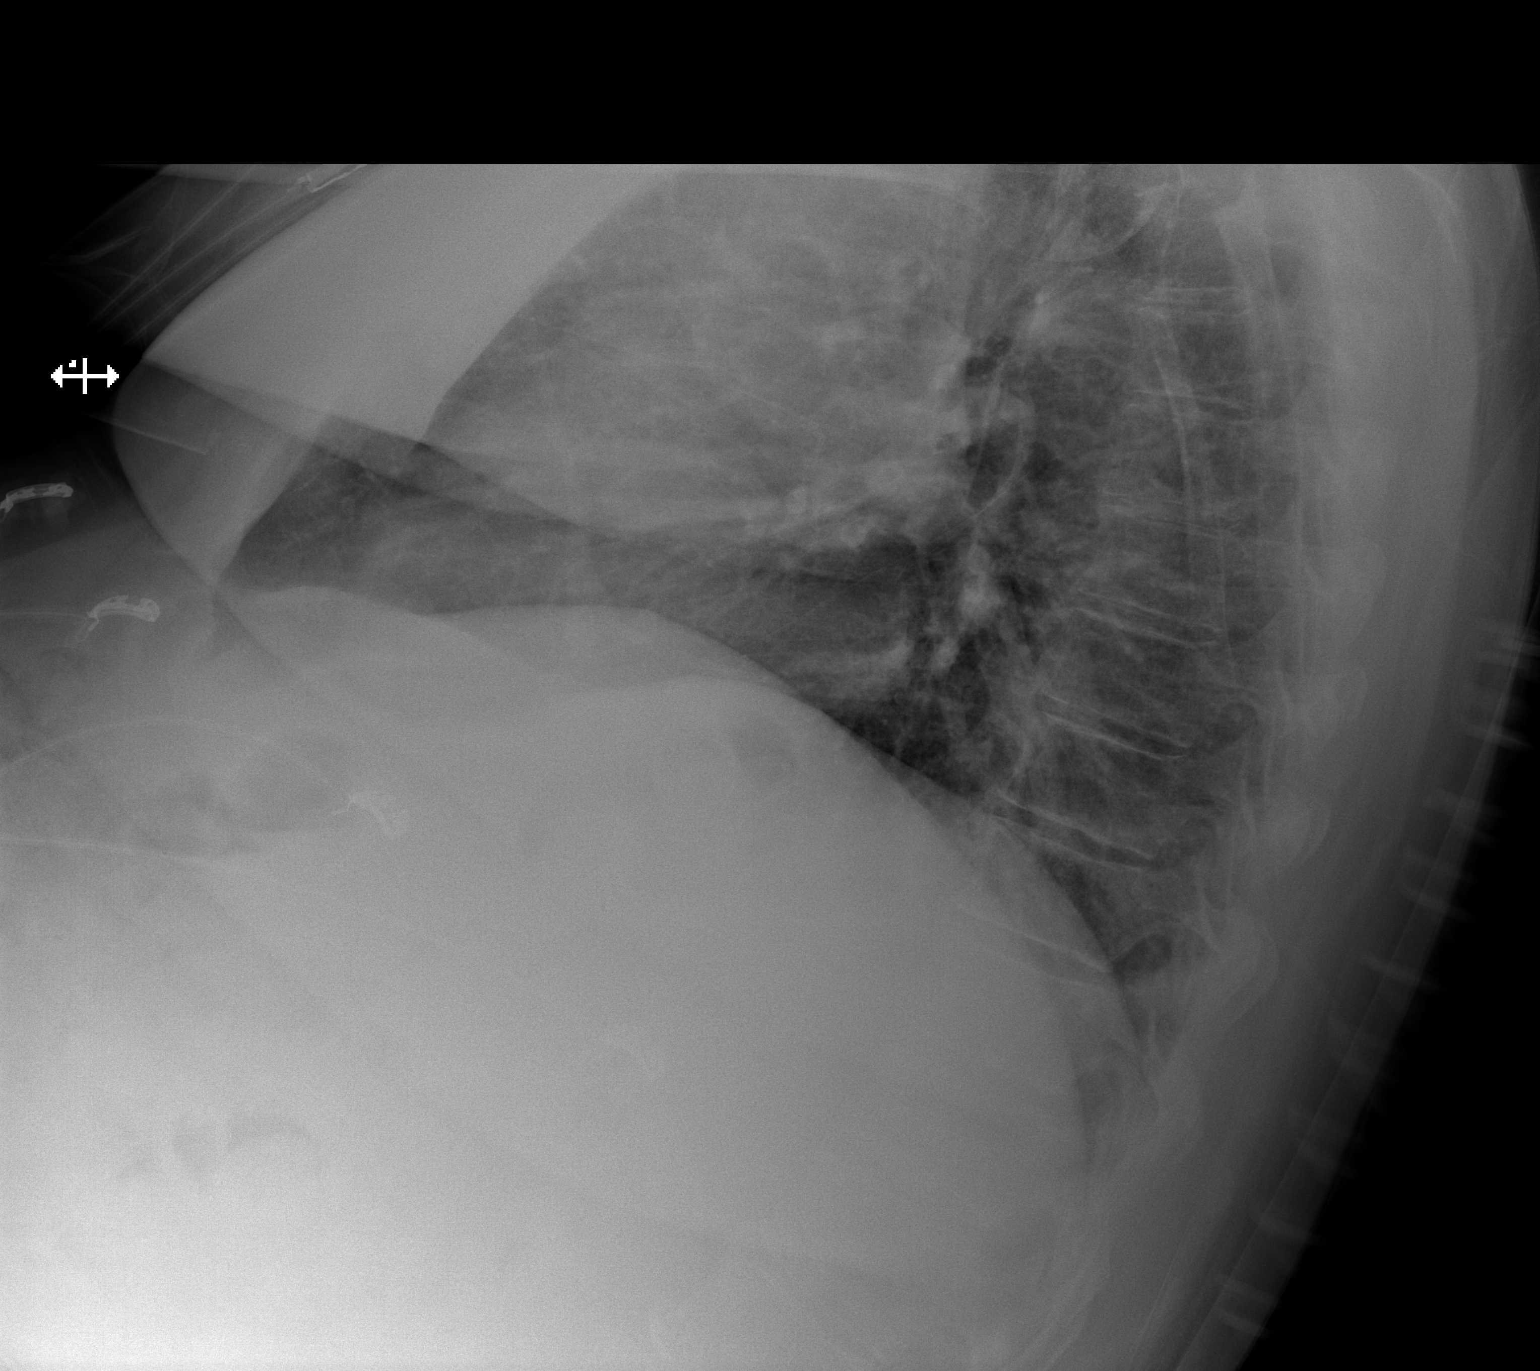

[x chest ap]
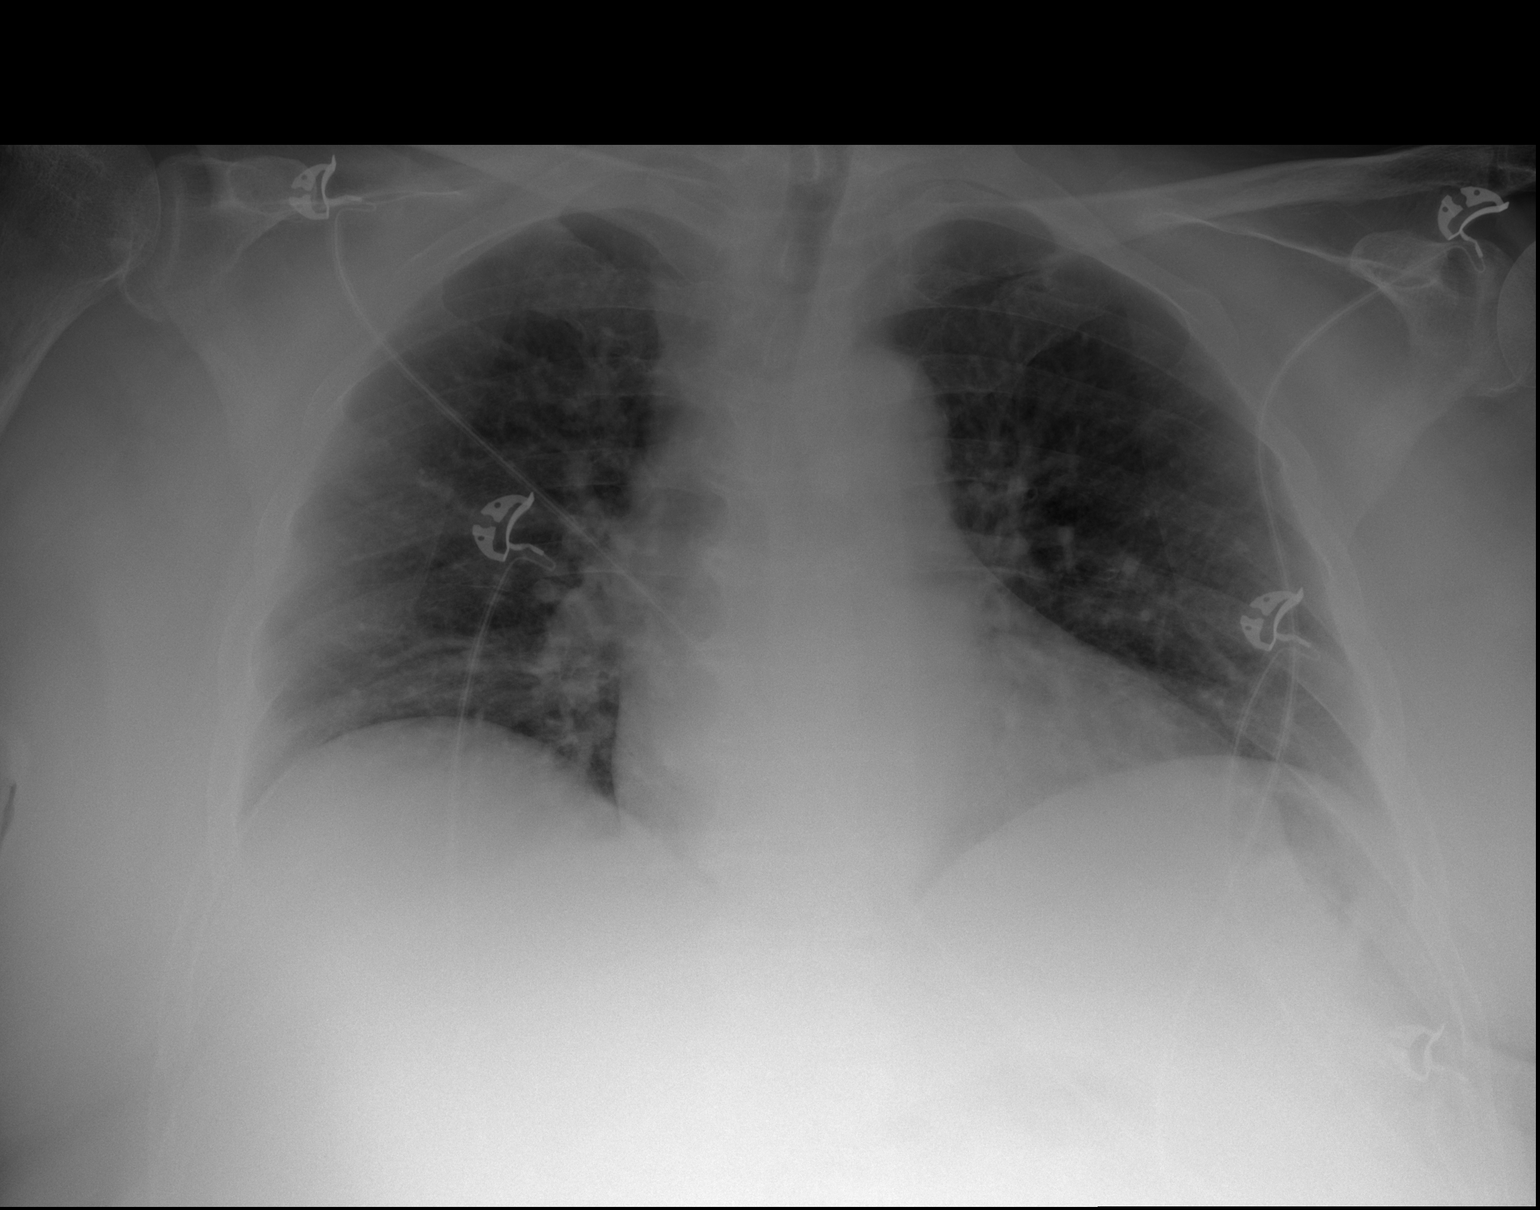

[2 of 2 positions shown; findings below may reference images not displayed]

FINDINGS: The heart size and mediastinal contours are within normal limits.
Both lungs are clear. The visualized skeletal structures are
unremarkable.
IMPRESSION: No active cardiopulmonary disease.

## 2020-06-06 MED ORDER — CIPROFLOXACIN HCL 500 MG PO TABS: 500.0000 mg | ORAL_TABLET | Freq: Two times a day (BID) | ORAL | 0 refills | Status: DC

## 2020-06-06 MED ORDER — SODIUM CHLORIDE 0.9 % IV BOLUS
1000.0000 mL | Freq: Once | INTRAVENOUS | Status: AC
  Administered 2020-06-06: 1000 mL via INTRAVENOUS

## 2020-06-06 MED ORDER — INSULIN ASPART 100 UNIT/ML ~~LOC~~ SOLN
10.0000 [IU] | Freq: Once | SUBCUTANEOUS | Status: AC
  Administered 2020-06-06: 10 [IU] via SUBCUTANEOUS
  Filled 2020-06-06: qty 0.1

## 2020-06-06 MED ORDER — LOSARTAN POTASSIUM 50 MG PO TABS
50.0000 mg | ORAL_TABLET | Freq: Two times a day (BID) | ORAL | Status: DC
  Administered 2020-06-06: 50 mg via ORAL
  Filled 2020-06-06 (×2): qty 1

## 2020-06-06 NOTE — ED Provider Notes (Signed)
[facility] DEPT Provider Note   CSN: [id number] Arrival date & time: 06/06/20  5027     History Chief Complaint  Patient presents with  . Chest Pain  . Shortness of Breath    Rumaldo Brewer is a 59 y.o. male.  Patient history of diabetes.  He complains of chest pain shortness of breath aching for another week.  He also complains of left testicle discomfort.  He has had epididymitis before there  The history is provided by the patient and medical records. No language interpreter was used.  Chest Pain Pain location:  L chest Pain quality: aching   Pain radiates to:  Does not radiate Pain severity:  Mild Onset quality:  Gradual Timing:  Intermittent Progression:  Waxing and waning Chronicity:  New Context: not breathing   Associated symptoms: shortness of breath   Associated symptoms: no abdominal pain, no back pain, no cough, no fatigue and no headache   Shortness of Breath Associated symptoms: chest pain   Associated symptoms: no abdominal pain, no cough, no headaches and no rash        Past Medical History:  Diagnosis Date  . Arthritis   . CAD (coronary artery disease)  cardiac cath with moderate disease in a septal branch of the ramus intermedius 04/01/2012  . Depression   . Diabetes mellitus    poorly controlled by his report  . History of narcotic addiction ([place])    past history of back pain  . Hypercholesteremia   . Hypertension   . IBS (irritable bowel syndrome)   . 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[place])   . Neuropathy   . Obesity    Max weight was 390  . OSA on CPAP   . Panic attacks   . Testosterone deficiency   . Vertigo     Patient Active Problem List   Diagnosis Date Noted  . Balance problem 03/11/2020  . Tinea cruris 03/11/2020  . Cellulitis of left groin 03/11/2020  . Acute pain of right shoulder 03/11/2020  . Interstitial lung disease ([place]) 05/02/2017  . Pansinusitis 09/26/2016  . Diabetic polyneuropathy  associated with diabetes mellitus due to underlying condition ([place]) 05/08/2016  . Methamphetamine use disorder, severe, dependence ([place]) 02/11/2016  . Substance induced mood disorder ([place]) 02/11/2016  . Exertional dyspnea 11/30/2015  . ADD (attention deficit disorder) 09/29/2015  . Binge eating 09/29/2015  . Syncope 05/05/2012  . Diarrhea 05/05/2012  . Panic attacks 05/05/2012  . OSA (obstructive sleep apnea) 05/05/2012  . HTN (hypertension) 05/05/2012  . Diabetes mellitus, type II ([place]) 05/05/2012  . Dyslipidemia 05/05/2012  . CAD (coronary artery disease)  cardiac cath with moderate disease in a septal branch of the ramus intermedius 04/01/2012  . Chest pain 04/01/2012  . Drug abuse and dependence ([place]) 04/01/2012  . Family history of coronary artery disease 03/31/2012  . Sleep apnea, on C-pap 03/31/2012  . Hyperlipemia 03/30/2012  . HTN (hypertension), benign 03/30/2012  . Morbid obesity ([place]) 03/30/2012  . DM type 2, uncontrolled, with neuropathy ([place]) 03/30/2012  . Depression with anxiety 03/30/2012    Past Surgical History:  Procedure Laterality Date  . CARDIAC CATHETERIZATION    . LEFT HEART CATHETERIZATION WITH CORONARY ANGIOGRAM N/A 03/31/2012   Procedure: LEFT HEART CATHETERIZATION WITH CORONARY ANGIOGRAM;  Surgeon: Leonie Man, MD;  Location: [facility] CATH LAB;  Service: Cardiovascular;  Laterality: N/A;       Family History  Problem Relation Age of Onset  . Diabetes type II Father   .  Hypertension Father   . Pancreatic disease Father 68       Deceased  . Healthy Mother   . Healthy Sister   . Healthy Son   . Healthy Daughter     Social History   Tobacco Use  . Smoking status: Current Every Day Smoker    Packs/day: 0.50    Types: Cigarettes  . Smokeless tobacco: Never Used  Vaping Use  . Vaping Use: Never used  Substance Use Topics  . Alcohol use: No    Alcohol/week: 0.0 standard drinks  . Drug use: Yes    Types: Methamphetamines    Home  Medications Prior to Admission medications   Medication Sig Start Date End Date Taking? Authorizing Provider  acetaminophen (TYLENOL) 325 MG tablet Take 650 mg by mouth every 6 (six) hours as needed for mild pain, fever or headache.   Yes [provider]  gabapentin (NEURONTIN) 300 MG capsule Take 2 tablets in the morning, 1 tablet in the afternoon, and 1 tablet at bedtime. Patient taking differently: Take 300-600 mg by mouth See admin instructions. 600mg  in the morning 300mg  in the afternoon 300mg  at bedtime 04/29/20  Yes Patel, Donika K, DO  insulin NPH-regular Human (70-30) 100 UNIT/ML injection Inject 60 Units into the skin 3 (three) times daily.   Yes [provider]  losartan (COZAAR) 50 MG tablet Take 1 tablet (50 mg total) by mouth daily. 03/10/20  Yes [name] R, DO  naproxen sodium (ALEVE) 220 MG tablet Take 440-660 mg by mouth 2 (two) times daily as needed (pain/headache).   Yes [provider]  pravastatin (PRAVACHOL) 40 MG tablet Take 1 tablet (40 mg total) by mouth daily. 03/10/20  Yes [name] R, DO  tamsulosin (FLOMAX) 0.4 MG CAPS capsule Take 1 capsule (0.4 mg total) by mouth daily. 03/10/20  Yes [name] R, DO  ciprofloxacin (CIPRO) 500 MG tablet Take 1 tablet (500 mg total) by mouth 2 (two) times daily. 06/06/20   Milton Ferguson, MD  HYDROcodone-acetaminophen (NORCO) 5-325 MG tablet Take 1 tablet by mouth every 6 (six) hours as needed for severe pain. Patient not taking: Reported on 06/06/2020 04/14/20   Molpus, John, MD  Insulin Lispro Prot & Lispro (HUMALOG MIX 75/25 KWIKPEN) (75-25) 100 UNIT/ML Kwikpen Inject 10 Units into the skin 2 (two) times daily. Patient not taking: Reported on 06/06/2020 05/02/17   Carollee Herter, Alferd Apa, DO  levofloxacin [facility]) 500 MG tablet Take 1 tablet daily starting Friday, 04/15/2020. Patient not taking: Reported on 06/06/2020 04/14/20   Molpus, John, MD  lidocaine (LIDODERM) 5 % Place 1  patch onto the skin daily. Remove & Discard patch within 12 hours or as directed by MD Patient not taking: Reported on 06/06/2020 05/31/20   Palumbo, April, MD  nystatin (NYSTATIN) powder Apply 1 application topically 3 (three) times daily. Patient not taking: Reported on 04/29/2020 03/10/20   [name], DO    Allergies    Gabapentin  Review of Systems   Review of Systems  Constitutional: Negative for appetite change and fatigue.  HENT: Negative for congestion, ear discharge and sinus pressure.   Eyes: Negative for discharge.  Respiratory: Positive for shortness of breath. Negative for cough.   Cardiovascular: Positive for chest pain.  Gastrointestinal: Negative for abdominal pain and diarrhea.  Genitourinary: Negative for frequency and hematuria.  Musculoskeletal: Negative for back pain.  Skin: Negative for rash.  Neurological: Negative for seizures and headaches.  Psychiatric/Behavioral: Negative  for hallucinations.    Physical Exam Updated Vital Signs BP (!) 173/90   Pulse 77   Temp 98.5 F (36.9 C) (Oral)   Resp 18   Ht 5\' 11"  (1.803 m)   Wt (!) 137.9 kg   SpO2 98%   BMI 42.40 kg/m   Physical Exam Vitals and nursing note reviewed.  Constitutional:      Appearance: He is well-developed.  HENT:     Head: Normocephalic.     Nose: Nose normal.  Eyes:     General: No scleral icterus.    Conjunctiva/sclera: Conjunctivae normal.  Neck:     Thyroid: No thyromegaly.  Cardiovascular:     Rate and Rhythm: Normal rate and regular rhythm.     Heart sounds: No murmur heard.  No friction rub. No gallop.   Pulmonary:     Breath sounds: No stridor. No wheezing or rales.  Chest:     Chest wall: No tenderness.  Abdominal:     General: There is no distension.     Tenderness: There is no abdominal tenderness. There is no rebound.  Genitourinary:    Comments: Tender left testicle Musculoskeletal:        General: Normal range of motion.     Cervical back: Neck  supple.  Lymphadenopathy:     Cervical: No cervical adenopathy.  Skin:    Findings: No erythema or rash.  Neurological:     Mental Status: He is alert and oriented to person, place, and time.     Motor: No abnormal muscle tone.     Coordination: Coordination normal.  Psychiatric:        Behavior: Behavior normal.     ED Results / Procedures / Treatments   Labs (all labs ordered are listed, but only abnormal results are displayed) Labs Reviewed  CBC WITH DIFFERENTIAL/PLATELET - Abnormal; Notable for the following components:      Result Value   Abs Immature Granulocytes 0.14 (*)    All other components within normal limits  COMPREHENSIVE METABOLIC PANEL - Abnormal; Notable for the following components:   Sodium 131 (*)    Chloride 95 (*)    Glucose, Bld 464 (*)    BUN 23 (*)    AST 14 (*)    All other components within normal limits  URINALYSIS, ROUTINE W REFLEX MICROSCOPIC - Abnormal; Notable for the following components:   Glucose, UA >=500 (*)    Bacteria, UA RARE (*)    All other components within normal limits  CBG MONITORING, ED - Abnormal; Notable for the following components:   Glucose-Capillary 465 (*)    All other components within normal limits  CBG MONITORING, ED - Abnormal; Notable for the following components:   Glucose-Capillary 351 (*)    All other components within normal limits  RESPIRATORY PANEL BY RT PCR (FLU A&B, COVID)  D-DIMER, QUANTITATIVE (NOT AT [facility])  TROPONIN I (HIGH SENSITIVITY)  TROPONIN I (HIGH SENSITIVITY)    EKG None  Radiology DG Chest 2 View  Result Date: 06/06/2020 CLINICAL DATA:  Weakness EXAM: CHEST - 2 VIEW COMPARISON:  05/30/2020 FINDINGS: The heart size and mediastinal contours are within normal limits. Both lungs are clear. The visualized skeletal structures are unremarkable. IMPRESSION: No active cardiopulmonary disease. Electronically Signed   By: Kathreen Devoid   On: 06/06/2020 11:06    Procedures Procedures (including  critical care time)  Medications Ordered in ED Medications  losartan (COZAAR) tablet 50 mg (50 mg Oral Given 06/06/20  1223)  sodium chloride 0.9 % bolus 1,000 mL (0 mLs Intravenous Stopped 06/06/20 1224)  insulin aspart (novoLOG) injection 10 Units (10 Units Subcutaneous Given 06/06/20 1125)  sodium chloride 0.9 % bolus 1,000 mL (1,000 mLs Intravenous New Bag/Given 06/06/20 1224)    ED Course  I have reviewed the triage vital signs and the nursing notes.  Pertinent labs & imaging results that were available during my care of the patient were reviewed by me and considered in my medical decision making (see chart for details).    MDM Rules/Calculators/A&P                          Patient with poorly controlled diabetes and myalgia.  He also has epididymitis.  He will be placed on Cipro and follow-up with urology and his primary care doctor      This patient presents to the ED for concern of myalgias, this involves an extensive number of treatment options, and is a complaint that carries with it a high risk of complications and morbidity.  The differential diagnosis includes diabetes poorly controlled  Lab Tests:   Patient CBC chemistries showed elevated glucose   Medicines ordered:   I ordered medication saline for dehydration  Imaging Studies ordered:   I ordered imaging studies which included chest x-ray  I independently visualized and interpreted imaging which showed unremarkable  Additional history obtained:   Additional history obtained from record  Previous records obtained and reviewed.  Consultations Obtained:     Reevaluation:  After the interventions stated above, I reevaluated the patient and found improved  Critical Interventions:  .   Final Clinical Impression(s) / ED Diagnoses Final diagnoses:  Hyperglycemia  Epididymitis    Rx / DC Orders ED Discharge Orders         Ordered    ciprofloxacin (CIPRO) 500 MG tablet  2 times daily         06/06/20 1356           Milton Ferguson, MD 06/06/20 1400

## 2020-06-06 NOTE — ED Triage Notes (Addendum)
Patient coming from home with complaints of chest pain that has been going on for a few weeks now. Woke up this morning with SHOB, increased chest pain, and bilateral leg pain. Also states his blood sugar this morning was 600.

## 2020-06-06 NOTE — Discharge Instructions (Addendum)
Follow-up with [facility] for your inflamed scrotum.  Drink plenty of fluids and follow-up with your primary care doctor for your elevated glucose

## 2020-06-09 DIAGNOSIS — Z79899 Other long term (current) drug therapy: Secondary | ICD-10-CM | POA: Diagnosis not present

## 2020-06-09 DIAGNOSIS — R69 Illness, unspecified: Secondary | ICD-10-CM | POA: Diagnosis not present

## 2020-06-09 DIAGNOSIS — Z794 Long term (current) use of insulin: Secondary | ICD-10-CM | POA: Diagnosis not present

## 2020-06-09 DIAGNOSIS — Z888 Allergy status to other drugs, medicaments and biological substances status: Secondary | ICD-10-CM | POA: Diagnosis not present

## 2020-06-09 DIAGNOSIS — Z7984 Long term (current) use of oral hypoglycemic drugs: Secondary | ICD-10-CM | POA: Diagnosis not present

## 2020-06-09 DIAGNOSIS — N50812 Left testicular pain: Secondary | ICD-10-CM | POA: Diagnosis not present

## 2020-06-09 DIAGNOSIS — N433 Hydrocele, unspecified: Secondary | ICD-10-CM | POA: Diagnosis not present

## 2020-06-09 DIAGNOSIS — L4050 Arthropathic psoriasis, unspecified: Secondary | ICD-10-CM | POA: Diagnosis not present

## 2020-06-09 DIAGNOSIS — N5089 Other specified disorders of the male genital organs: Secondary | ICD-10-CM | POA: Diagnosis not present

## 2020-06-09 DIAGNOSIS — I10 Essential (primary) hypertension: Secondary | ICD-10-CM | POA: Diagnosis not present

## 2020-06-09 DIAGNOSIS — E119 Type 2 diabetes mellitus without complications: Secondary | ICD-10-CM | POA: Diagnosis not present

## 2020-06-15 ENCOUNTER — Ambulatory Visit: Payer: Medicare HMO | Admitting: Medical

## 2020-06-16 ENCOUNTER — Emergency Department (HOSPITAL_BASED_OUTPATIENT_CLINIC_OR_DEPARTMENT_OTHER)
Admission: EM | Admit: 2020-06-16 | Discharge: 2020-06-16 | Disposition: A | Discharge status: Home / Self Care | Payer: Medicare HMO | Attending: Emergency Medicine | Admitting: Emergency Medicine

## 2020-06-16 ENCOUNTER — Other Ambulatory Visit: Payer: Self-pay

## 2020-06-16 ENCOUNTER — Encounter (HOSPITAL_BASED_OUTPATIENT_CLINIC_OR_DEPARTMENT_OTHER): Payer: Self-pay | Admitting: Emergency Medicine

## 2020-06-16 ENCOUNTER — Emergency Department (HOSPITAL_BASED_OUTPATIENT_CLINIC_OR_DEPARTMENT_OTHER): Payer: Medicare HMO

## 2020-06-16 DIAGNOSIS — Z79899 Other long term (current) drug therapy: Secondary | ICD-10-CM | POA: Insufficient documentation

## 2020-06-16 DIAGNOSIS — I10 Essential (primary) hypertension: Secondary | ICD-10-CM | POA: Diagnosis not present

## 2020-06-16 DIAGNOSIS — I2510 Atherosclerotic heart disease of native coronary artery without angina pectoris: Secondary | ICD-10-CM | POA: Insufficient documentation

## 2020-06-16 DIAGNOSIS — F17210 Nicotine dependence, cigarettes, uncomplicated: Secondary | ICD-10-CM | POA: Insufficient documentation

## 2020-06-16 DIAGNOSIS — E1140 Type 2 diabetes mellitus with diabetic neuropathy, unspecified: Secondary | ICD-10-CM | POA: Diagnosis not present

## 2020-06-16 DIAGNOSIS — Z794 Long term (current) use of insulin: Secondary | ICD-10-CM | POA: Diagnosis not present

## 2020-06-16 DIAGNOSIS — N50812 Left testicular pain: Secondary | ICD-10-CM | POA: Diagnosis not present

## 2020-06-16 DIAGNOSIS — N451 Epididymitis: Secondary | ICD-10-CM | POA: Diagnosis not present

## 2020-06-16 DIAGNOSIS — R102 Pelvic and perineal pain: Secondary | ICD-10-CM | POA: Diagnosis present

## 2020-06-16 DIAGNOSIS — R69 Illness, unspecified: Secondary | ICD-10-CM | POA: Diagnosis not present

## 2020-06-16 LAB — CBC WITH DIFFERENTIAL/PLATELET
Abs Immature Granulocytes: 0.04 10*3/uL (ref 0.00–0.07)
Basophils Absolute: 0.1 10*3/uL (ref 0.0–0.1)
Basophils Relative: 1 %
Eosinophils Absolute: 0.3 10*3/uL (ref 0.0–0.5)
Eosinophils Relative: 4 %
HCT: 44.9 % (ref 39.0–52.0)
Hemoglobin: 15.4 g/dL (ref 13.0–17.0)
Immature Granulocytes: 1 %
Lymphocytes Relative: 27 %
Lymphs Abs: 1.7 10*3/uL (ref 0.7–4.0)
MCH: 29.4 pg (ref 26.0–34.0)
MCHC: 34.3 g/dL (ref 30.0–36.0)
MCV: 85.7 fL (ref 80.0–100.0)
Monocytes Absolute: 0.6 10*3/uL (ref 0.1–1.0)
Monocytes Relative: 9 %
Neutro Abs: 3.7 10*3/uL (ref 1.7–7.7)
Neutrophils Relative %: 58 %
Platelets: 249 10*3/uL (ref 150–400)
RBC: 5.24 MIL/uL (ref 4.22–5.81)
RDW: 12.4 % (ref 11.5–15.5)
WBC: 6.3 10*3/uL (ref 4.0–10.5)
nRBC: 0 % (ref 0.0–0.2)

## 2020-06-16 LAB — URINALYSIS, ROUTINE W REFLEX MICROSCOPIC
Bilirubin Urine: NEGATIVE
Glucose, UA: >=500 mg/dL — AB
Hgb urine dipstick: NEGATIVE
Ketones, ur: NEGATIVE mg/dL
Leukocytes,Ua: NEGATIVE
Nitrite: NEGATIVE
Protein, ur: NEGATIVE mg/dL
Specific Gravity, Urine: 1.015 (ref 1.005–1.030)
pH: 5.5 (ref 5.0–8.0)

## 2020-06-16 LAB — URINALYSIS, MICROSCOPIC (REFLEX)

## 2020-06-16 IMAGING — US US SCROTUM W/ DOPPLER COMPLETE
1 series · 14 of 25 positions shown · non-contrast
Comparison: Scrotal ultrasound dated [DATE].

CLINICAL DATA: Intermittent left testicular pain for 1 year.

EXAM:
SCROTAL ULTRASOUND
DOPPLER ULTRASOUND OF THE TESTICLES
TECHNIQUE: Complete ultrasound examination of the testicles, epididymis, and
other scrotal structures was performed. Color and spectral Doppler
ultrasound were also utilized to evaluate blood flow to the
testicles.

[Series 1: us scrotum w/ doppler complete · 14 of 35 slices shown]
[im 1/35]
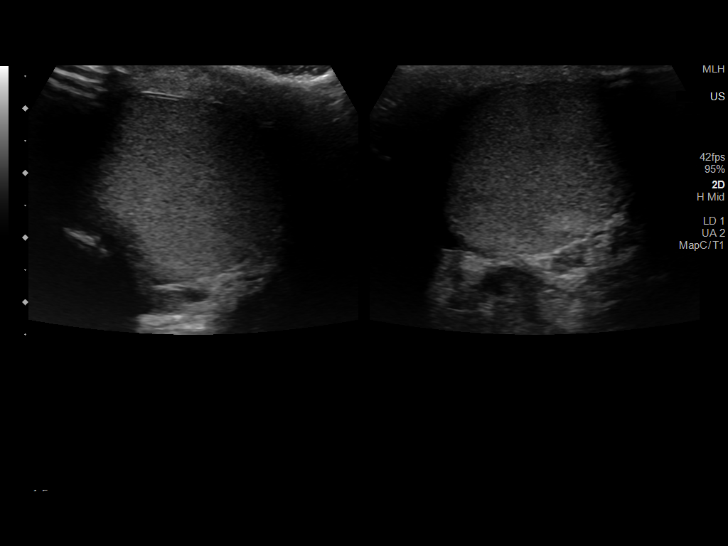
[im 3/35]
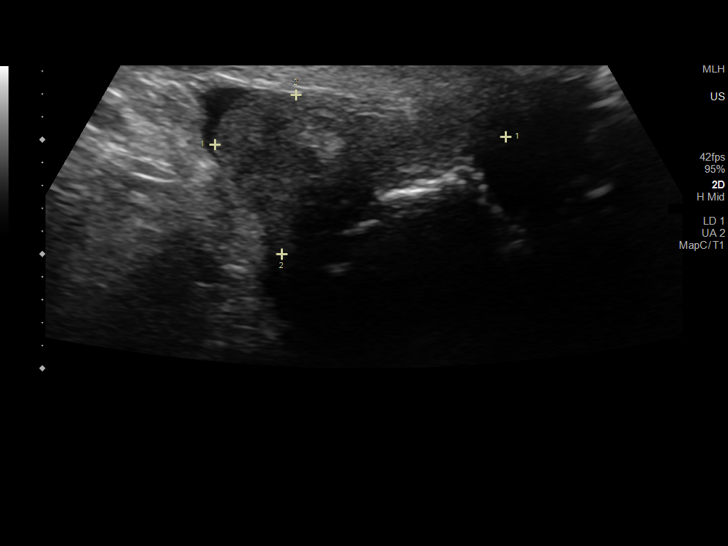
[im 6/35]
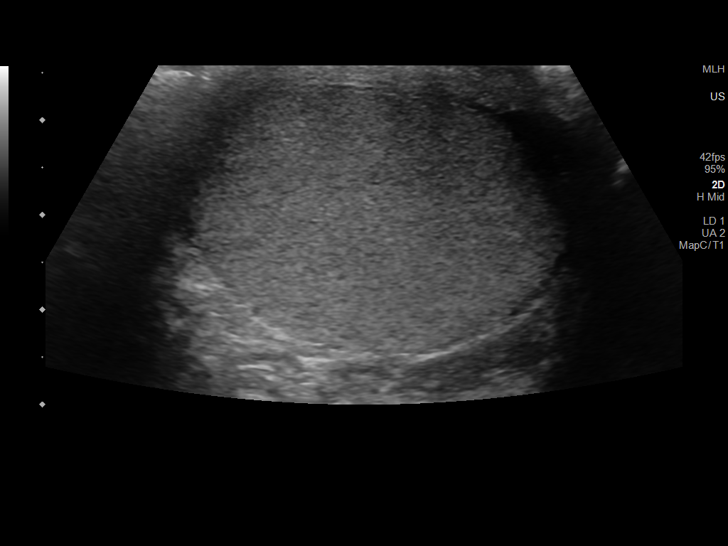
[im 9/35]
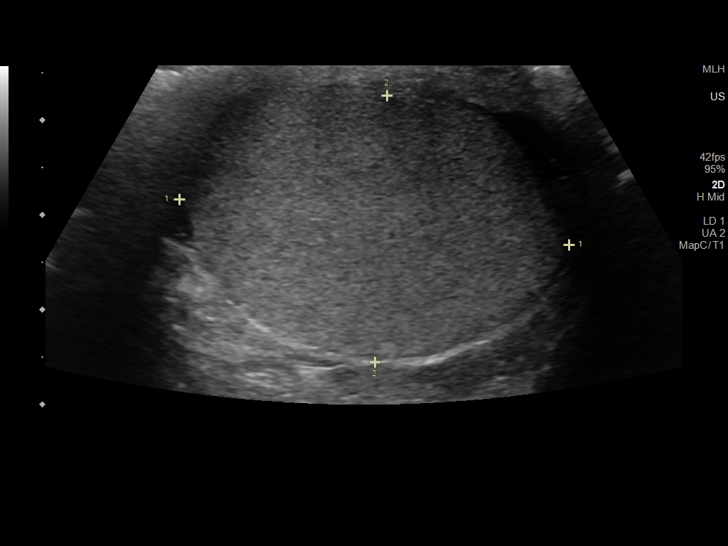
[im 12/35]
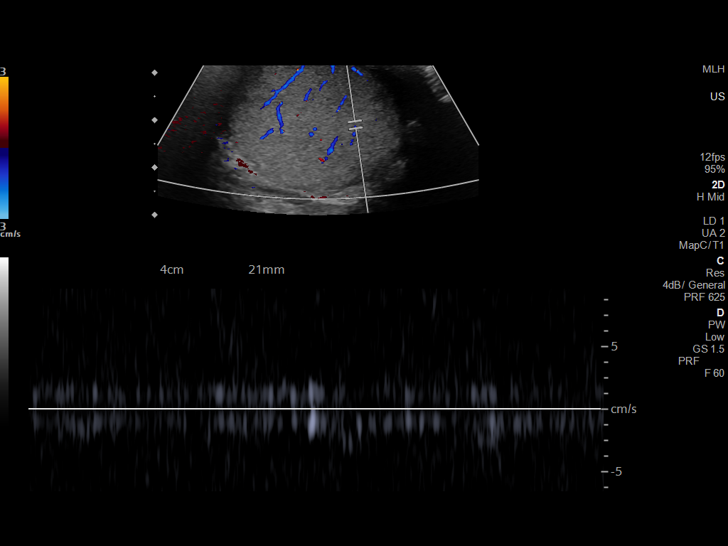
[im 13/35]
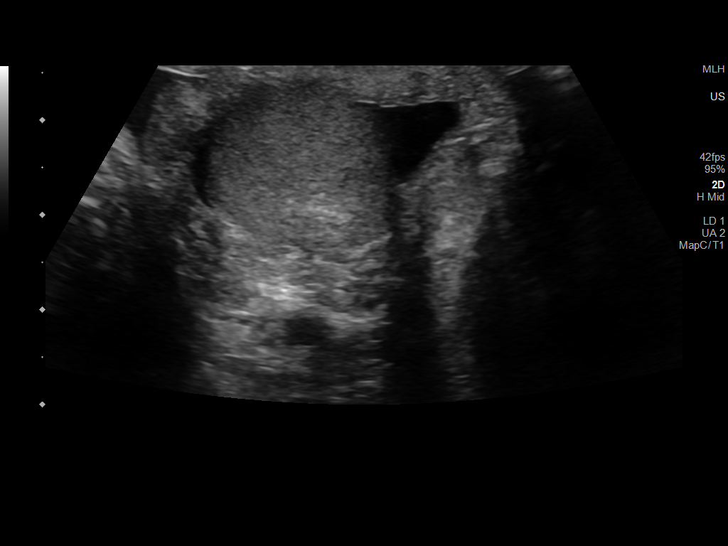
[im 16/35]
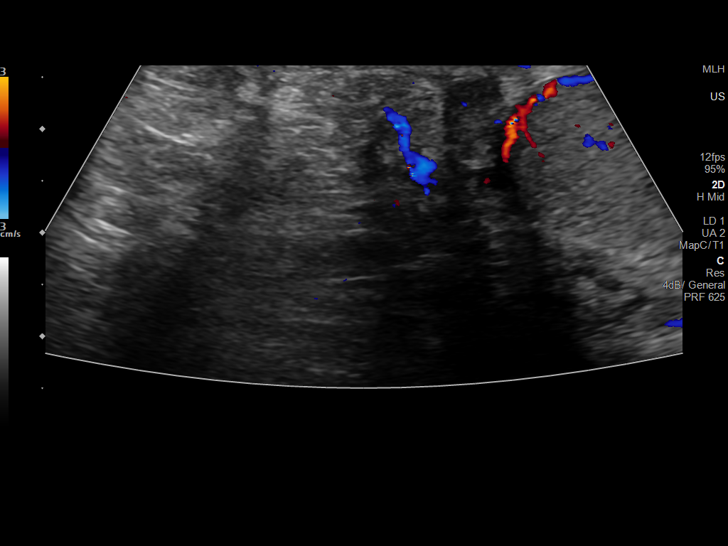
[im 19/35]
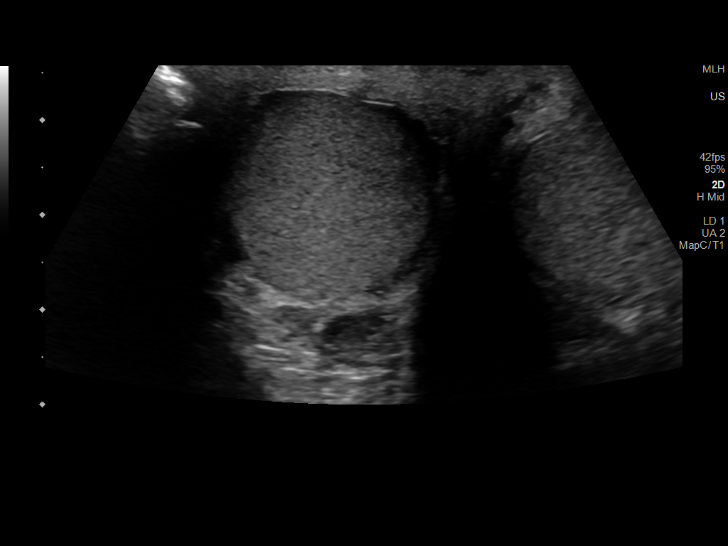
[im 22/35]
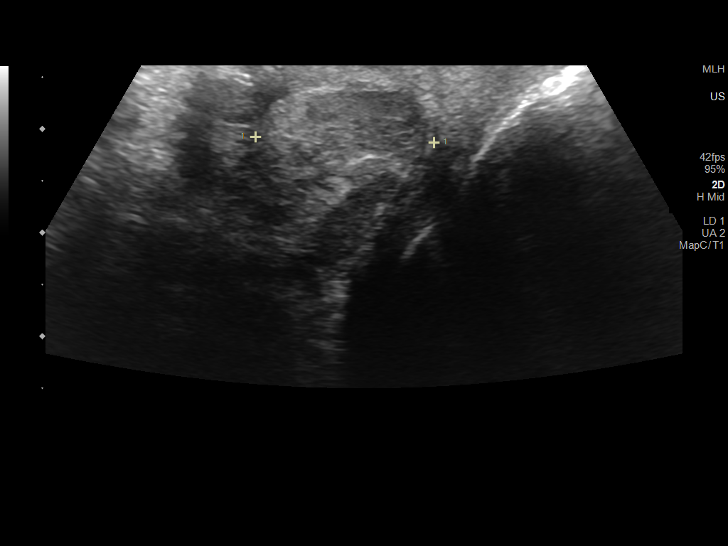
[im 23/35]
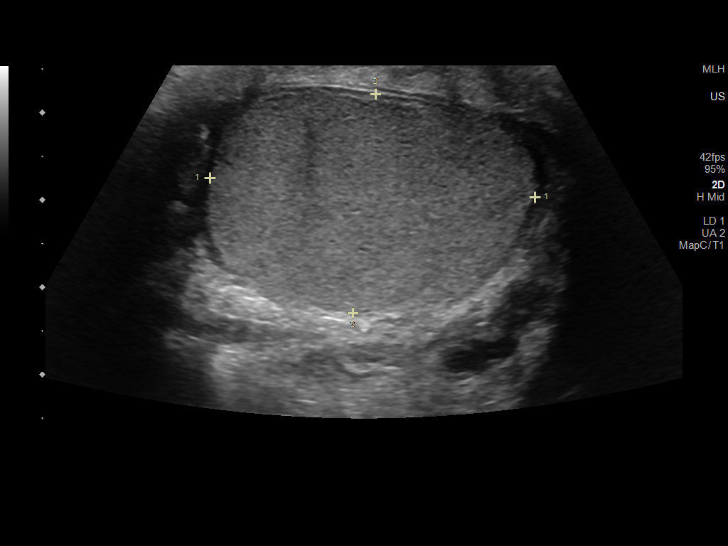
[im 26/35]
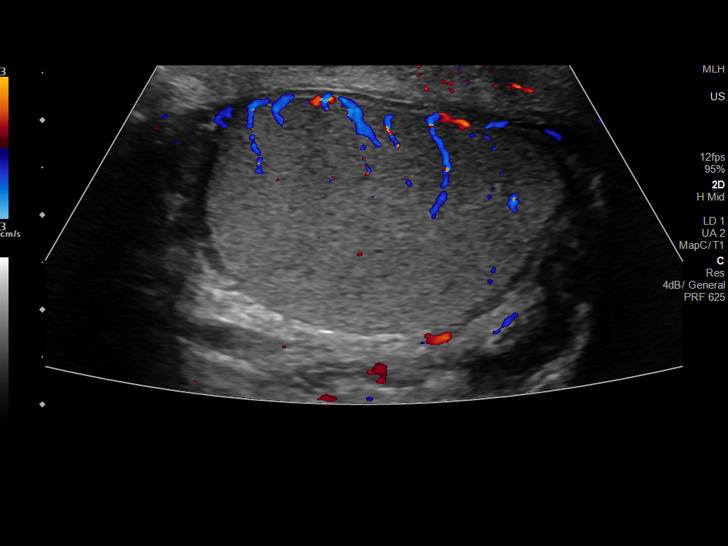
[im 29/35]
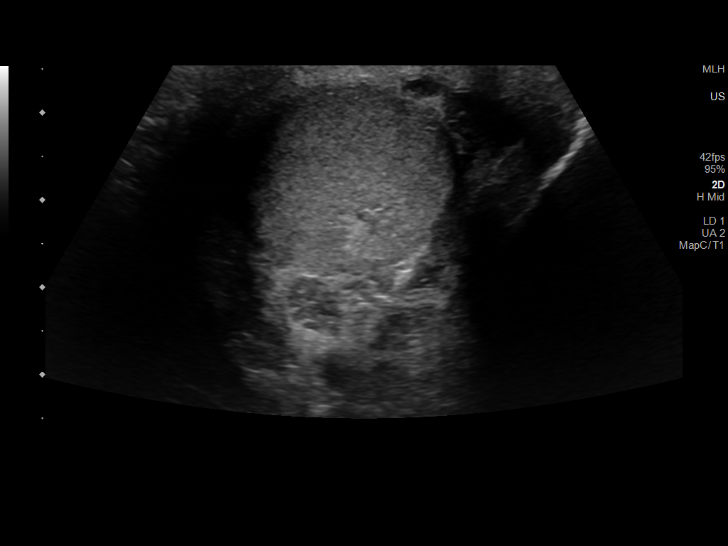
[im 32/35]
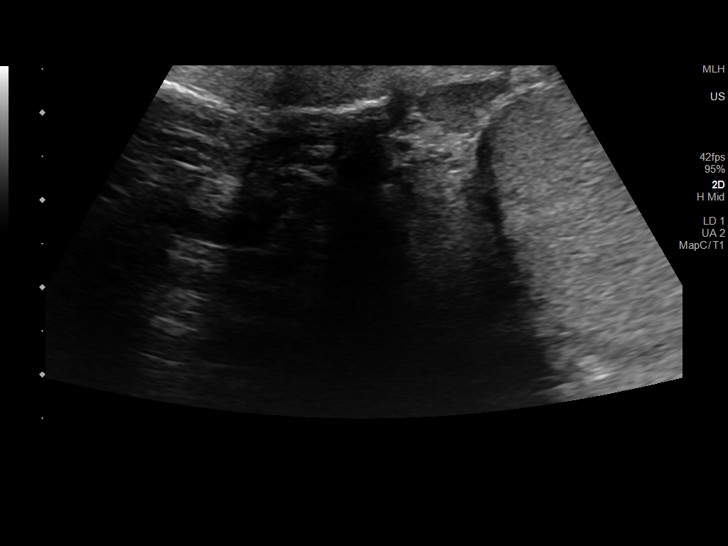
[im 35/35]
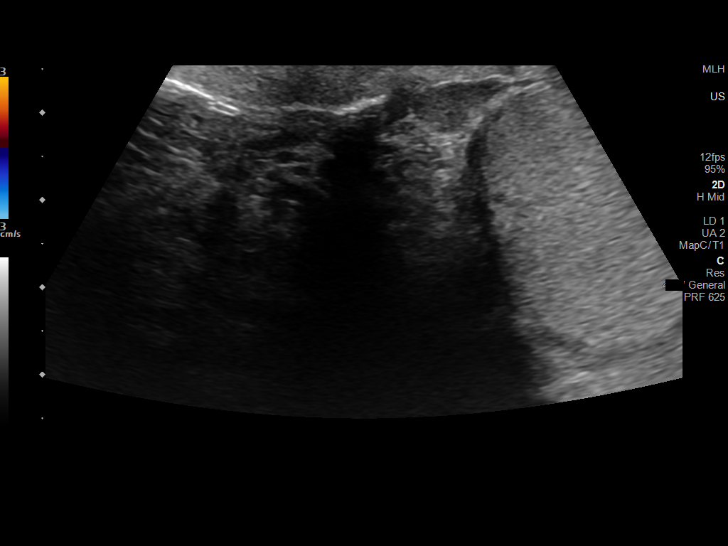

[14 of 25 positions shown; findings below may reference images not displayed]

FINDINGS: Right testicle

Measurements: 4.1 x 2.8 x 2.7 cm. No mass or microlithiasis
visualized.

Left testicle

Measurements: 3.7 x 2.5 x 2.4 cm. No mass or microlithiasis
visualized.

Right epididymis:  Normal in size and appearance.

Left epididymis:  Slightly heterogeneous echotexture.

Hydrocele:  None visualized.

Varicocele:  None visualized.

Pulsed Doppler interrogation of both testes demonstrates normal low
resistance arterial and venous waveforms bilaterally.
IMPRESSION: 1. Slightly heterogeneous echotexture of the left epididymis could
reflect epididymitis in the appropriate clinical setting. Otherwise,
no findings to explain the patient's symptoms.

## 2020-06-16 MED ORDER — SULFAMETHOXAZOLE-TRIMETHOPRIM 800-160 MG PO TABS: 1.0000 | ORAL_TABLET | Freq: Two times a day (BID) | ORAL | 0 refills | Status: AC

## 2020-06-16 MED ORDER — OXYCODONE-ACETAMINOPHEN 5-325 MG PO TABS
1.0000 | ORAL_TABLET | Freq: Once | ORAL | Status: AC
  Administered 2020-06-16: 1 via ORAL
  Filled 2020-06-16: qty 1

## 2020-06-16 NOTE — ED Triage Notes (Signed)
L hip and leg pain for 3 days.

## 2020-06-16 NOTE — ED Notes (Signed)
Patient transported to Ultrasound 

## 2020-06-16 NOTE — ED Notes (Signed)
This RN went in to assess pt, pt does not appear to be in distress. Falling asleep in middle of conversation. Complaining of pain to left hip radiating down leg with tingling. States he fell 3 weeks ago but pain started within the past few days

## 2020-06-16 NOTE — ED Provider Notes (Signed)
[facility] EMERGENCY DEPARTMENT Provider Note   CSN: [id number] Arrival date & time: 06/16/20  3474     History Chief Complaint  Patient presents with  . Hip Pain    Tyler Brewer is a 59 y.o. male.  HPI   Patient with significant medical history of CAD, diabetes, hypertension, IBS, presents emergency department with chief complaint of left groin pain and left testicular pain.  Patient states the pain started about 3 days ago and has increasing gotten worse.  He states he feels a sharp-like sensation in his left groin which then radiates to his hip and down his leg.  He endorses that his left testicle is tender and admits that he feels like his urine is "thick".  He denies hematuria, penile discharge, dysuria, saddle pain, lower back pain, abdominal pain, nausea, vomiting, diarrhea.  He states he was diagnosed with epididymitis and has had pain since then.  He was recently seen at the emergency department on the 21st where they performed a scrotal ultrasound showing he has hydrocele and was negative for testicular torsion.  Patient states he is not taking any pain medications, denies recent trauma to the area.  Patient denies headache, fever, chills, shortness of breath, chest pain, abdominal pain, nausea vomiting, diarrhea.  Past Medical History:  Diagnosis Date  . Arthritis   . CAD (coronary artery disease)  cardiac cath with moderate disease in a septal branch of the ramus intermedius 04/01/2012  . Depression   . Diabetes mellitus    poorly controlled by his report  . History of narcotic addiction ([place])    past history of back pain  . Hypercholesteremia   . Hypertension   . IBS (irritable bowel syndrome)   . 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[place])   . Neuropathy   . Obesity    Max weight was 390  . OSA on CPAP   . Panic attacks   . Testosterone deficiency   . Vertigo     Patient Active Problem List   Diagnosis Date Noted  . Balance problem 03/11/2020  . Tinea  cruris 03/11/2020  . Cellulitis of left groin 03/11/2020  . Acute pain of right shoulder 03/11/2020  . Interstitial lung disease ([place]) 05/02/2017  . Pansinusitis 09/26/2016  . Diabetic polyneuropathy associated with diabetes mellitus due to underlying condition ([place]) 05/08/2016  . Methamphetamine use disorder, severe, dependence ([place]) 02/11/2016  . Substance induced mood disorder ([place]) 02/11/2016  . Exertional dyspnea 11/30/2015  . ADD (attention deficit disorder) 09/29/2015  . Binge eating 09/29/2015  . Syncope 05/05/2012  . Diarrhea 05/05/2012  . Panic attacks 05/05/2012  . OSA (obstructive sleep apnea) 05/05/2012  . HTN (hypertension) 05/05/2012  . Diabetes mellitus, type II ([place]) 05/05/2012  . Dyslipidemia 05/05/2012  . CAD (coronary artery disease)  cardiac cath with moderate disease in a septal branch of the ramus intermedius 04/01/2012  . Chest pain 04/01/2012  . Drug abuse and dependence ([place]) 04/01/2012  . Family history of coronary artery disease 03/31/2012  . Sleep apnea, on C-pap 03/31/2012  . Hyperlipemia 03/30/2012  . HTN (hypertension), benign 03/30/2012  . Morbid obesity ([place]) 03/30/2012  . DM type 2, uncontrolled, with neuropathy ([place]) 03/30/2012  . Depression with anxiety 03/30/2012    Past Surgical History:  Procedure Laterality Date  . CARDIAC CATHETERIZATION    . LEFT HEART CATHETERIZATION WITH CORONARY ANGIOGRAM N/A 03/31/2012   Procedure: LEFT HEART CATHETERIZATION WITH CORONARY ANGIOGRAM;  Surgeon: Leonie Man, MD;  Location: [facility] CATH LAB;  Service:  Cardiovascular;  Laterality: N/A;       Family History  Problem Relation Age of Onset  . Diabetes type II Father   . Hypertension Father   . Pancreatic disease Father 17       Deceased  . Healthy Mother   . Healthy Sister   . Healthy Son   . Healthy Daughter     Social History   Tobacco Use  . Smoking status: Current Every Day Smoker    Packs/day: 0.50    Types: Cigarettes  . Smokeless  tobacco: Never Used  Vaping Use  . Vaping Use: Never used  Substance Use Topics  . Alcohol use: No    Alcohol/week: 0.0 standard drinks  . Drug use: Yes    Types: Methamphetamines    Home Medications Prior to Admission medications   Medication Sig Start Date End Date Taking? Authorizing Provider  acetaminophen (TYLENOL) 325 MG tablet Take 650 mg by mouth every 6 (six) hours as needed for mild pain, fever or headache.    [provider]  ciprofloxacin (CIPRO) 500 MG tablet Take 1 tablet (500 mg total) by mouth 2 (two) times daily. 06/06/20   Milton Ferguson, MD  gabapentin (NEURONTIN) 300 MG capsule Take 2 tablets in the morning, 1 tablet in the afternoon, and 1 tablet at bedtime. Patient taking differently: Take 300-600 mg by mouth See admin instructions. 600mg  in the morning 300mg  in the afternoon 300mg  at bedtime 04/29/20   Narda Amber K, DO  HYDROcodone-acetaminophen (NORCO) 5-325 MG tablet Take 1 tablet by mouth every 6 (six) hours as needed for severe pain. Patient not taking: Reported on 06/06/2020 04/14/20   Molpus, John, MD  Insulin Lispro Prot & Lispro (HUMALOG MIX 75/25 KWIKPEN) (75-25) 100 UNIT/ML Kwikpen Inject 10 Units into the skin 2 (two) times daily. Patient not taking: Reported on 06/06/2020 05/02/17   Carollee Herter, Alferd Apa, DO  insulin NPH-regular Human (70-30) 100 UNIT/ML injection Inject 60 Units into the skin 3 (three) times daily.    [provider]  levofloxacin (LEVAQUIN) 500 MG tablet Take 1 tablet daily starting Friday, 04/15/2020. Patient not taking: Reported on 06/06/2020 04/14/20   Molpus, John, MD  lidocaine (LIDODERM) 5 % Place 1 patch onto the skin daily. Remove & Discard patch within 12 hours or as directed by MD Patient not taking: Reported on 06/06/2020 05/31/20   Palumbo, April, MD  losartan (COZAAR) 50 MG tablet Take 1 tablet (50 mg total) by mouth daily. 03/10/20   [name], DO  naproxen sodium (ALEVE) 220 MG tablet Take  440-660 mg by mouth 2 (two) times daily as needed (pain/headache).    [provider]  nystatin (NYSTATIN) powder Apply 1 application topically 3 (three) times daily. Patient not taking: Reported on 04/29/2020 03/10/20   Carollee Herter, Alferd Apa, DO  pravastatin (PRAVACHOL) 40 MG tablet Take 1 tablet (40 mg total) by mouth daily. 03/10/20   [name], DO  sulfamethoxazole-trimethoprim (BACTRIM DS) 800-160 MG tablet Take 1 tablet by mouth 2 (two) times daily for 10 days. 06/16/20 06/26/20  Marcello Fennel, PA-C  tamsulosin (FLOMAX) 0.4 MG CAPS capsule Take 1 capsule (0.4 mg total) by mouth daily. 03/10/20   [name], DO    Allergies    Gabapentin  Review of Systems   Review of Systems  Constitutional: Negative for chills and fever.  HENT: Negative for congestion, tinnitus, trouble swallowing and voice change.   Eyes: Negative for visual  disturbance.  Respiratory: Negative for cough and shortness of breath.   Cardiovascular: Negative for chest pain and palpitations.  Gastrointestinal: Negative for abdominal pain, diarrhea, nausea and vomiting.  Genitourinary: Positive for scrotal swelling and testicular pain. Negative for discharge, enuresis, flank pain, frequency, penile pain and penile swelling.  Musculoskeletal: Negative for back pain.  Skin: Negative for rash.  Neurological: Negative for dizziness and headaches.  Hematological: Does not bruise/bleed easily.    Physical Exam Updated Vital Signs BP (!) 181/99 (BP Location: Right Arm)   Pulse 76   Temp 97.6 F (36.4 C) (Oral)   Resp 16   Ht 5\' 11"  (1.803 m)   Wt 130.2 kg   SpO2 100%   BMI 40.03 kg/m   Physical Exam Vitals and nursing note reviewed. Exam conducted with a chaperone present.  Constitutional:      General: He is not in acute distress.    Appearance: He is not ill-appearing.  HENT:     Head: Normocephalic and atraumatic.     Nose: No congestion.     Mouth/Throat:     Mouth:  Mucous membranes are moist.     Pharynx: Oropharynx is clear.  Eyes:     General: No scleral icterus. Cardiovascular:     Rate and Rhythm: Normal rate and regular rhythm.     Pulses: Normal pulses.     Heart sounds: No murmur heard.  No friction rub. No gallop.   Pulmonary:     Effort: No respiratory distress.     Breath sounds: No wheezing, rhonchi or rales.  Abdominal:     General: There is no distension.     Palpations: Abdomen is soft.     Tenderness: There is no abdominal tenderness. There is no right CVA tenderness, left CVA tenderness or guarding.  Genitourinary:    Penis: Normal.      Testes: Normal.     Comments: With a chaperone present genitals were visualized, no gross abnormalities noted with the penis or testicles.  Testicles were palpated if they were tender to palpation especially on the left side.  No hernia felt on exam, no [place] deformity noted. Musculoskeletal:        General: No swelling.  Skin:    General: Skin is warm and dry.     Capillary Refill: Capillary refill takes less than 2 seconds.     Findings: No rash.  Neurological:     Mental Status: He is alert.  Psychiatric:        Mood and Affect: Mood normal.     ED Results / Procedures / Treatments   Labs (all labs ordered are listed, but only abnormal results are displayed) Labs Reviewed  URINALYSIS, ROUTINE W REFLEX MICROSCOPIC - Abnormal; Notable for the following components:      Result Value   Glucose, UA >=500 (*)    All other components within normal limits  URINALYSIS, MICROSCOPIC (REFLEX) - Abnormal; Notable for the following components:   Bacteria, UA RARE (*)    All other components within normal limits  CBC WITH DIFFERENTIAL/PLATELET  GC/CHLAMYDIA PROBE AMP ([place]) NOT AT [facility]    EKG None  Radiology US SCROTUM W/DOPPLER  Result Date: 06/16/2020 CLINICAL DATA:  Intermittent left testicular pain for 1 year. EXAM: SCROTAL ULTRASOUND DOPPLER ULTRASOUND OF THE  TESTICLES TECHNIQUE: Complete ultrasound examination of the testicles, epididymis, and other scrotal structures was performed. Color and spectral Doppler ultrasound were also utilized to evaluate blood flow to the testicles.  COMPARISON:  Scrotal ultrasound dated 01/21/2020. FINDINGS: Right testicle Measurements: 4.1 x 2.8 x 2.7 cm. No mass or microlithiasis visualized. Left testicle Measurements: 3.7 x 2.5 x 2.4 cm. No mass or microlithiasis visualized. Right epididymis:  Normal in size and appearance. Left epididymis:  Slightly heterogeneous echotexture. Hydrocele:  None visualized. Varicocele:  None visualized. Pulsed Doppler interrogation of both testes demonstrates normal low resistance arterial and venous waveforms bilaterally. IMPRESSION: 1. Slightly heterogeneous echotexture of the left epididymis could reflect epididymitis in the appropriate clinical setting. Otherwise, no findings to explain the patient's symptoms. Electronically Signed   By: Zerita Boers M.D.   On: 06/16/2020 12:18    Procedures Procedures (including critical care time)  Medications Ordered in ED Medications  oxyCODONE-acetaminophen (PERCOCET/ROXICET) 5-325 MG per tablet 1 tablet (1 tablet Oral Given 06/16/20 1117)    ED Course  I have reviewed the triage vital signs and the nursing notes.  Pertinent labs & imaging results that were available during my care of the patient were reviewed by me and considered in my medical decision making (see chart for details).    MDM Rules/Calculators/A&P                          I have personally reviewed all imaging, labs and have interpreted them.  Patient presents with left groin and testicular pain.  He is alert, did not appear in acute distress, vital signs reassuring.  Will order labs, UA and ultrasound for further evaluation.  CBC negative for leukocytosis or signs of anemia.  UA negative for nitrates or leukocytes, no hematuria noted, rare bacteria found.  Ultrasound was  performed shows slightly heterogeneous echotexture of the left epididymis could reflect epididymitis.  No other acute findings found.  I have low suspicion for UTI or pyelonephritis as patient denies urinary symptoms, no CVA tenderness noted on exam, UA negative for nitrates or leukocytes.  Low suspicion for STD as patient denies penile discharge, denies sexual intercourse with new partners.  Will test for gonorrhea chlamydia will defer treatment at this time.  Low suspicion for testicular torsion as ultrasound did not reveal any acute findings. Low suspicion for systemic infection as patient is nontoxic-appearing, vital signs reassuring, no obvious source infection on exam, no leukocytosis noted on exam.  Low suspicion for reactive arthritis as patient denies penile discharge, denies arthritic pains.  I suspect patient's symptoms are due to epididymitis.  Reviewing patient's previous records he was treated with Levaquin on 08/26 will start patient on Bactrim and have him follow-up with urology for further management.  Vital signs have remained stable, no indication for hospital admission.  Patient discussed with attending and they agreed with assessment and plan.  Patient given at home care as well strict return precautions.  Patient verbalized that they understood agreed to said plan.    Final Clinical Impression(s) / ED Diagnoses Final diagnoses:  Epididymitis    Rx / DC Orders ED Discharge Orders         Ordered    sulfamethoxazole-trimethoprim (BACTRIM DS) 800-160 MG tablet  2 times daily        06/16/20 1238           Marcello Fennel, PA-C 06/16/20 [place], [place], DO 06/16/20 1453

## 2020-06-16 NOTE — Discharge Instructions (Signed)
Seen here for left groin pain.  Imaging reveals acute epididymitis.  I have started you on antibiotics please take as prescribed.  Also recommend taking over-the-counter pain medication like ibuprofen or Tylenol every 6 as needed please follow dosing the back of bottle.  Please stay hydrated as this will help with the infection.  It is important that you follow-up with a urologist for further evaluation management.  Come back to the emergency department if you develop chest pain, shortness of breath, severe abdominal pain, uncontrolled nausea, vomiting, diarrhea.

## 2020-06-17 LAB — GC/CHLAMYDIA PROBE AMP (~~LOC~~) NOT AT ARMC
Chlamydia: NEGATIVE
Comment: NEGATIVE
Comment: NORMAL
Neisseria Gonorrhea: NEGATIVE

## 2020-06-20 ENCOUNTER — Other Ambulatory Visit: Payer: Self-pay

## 2020-06-20 ENCOUNTER — Emergency Department (HOSPITAL_BASED_OUTPATIENT_CLINIC_OR_DEPARTMENT_OTHER): Payer: Medicare HMO

## 2020-06-20 ENCOUNTER — Encounter (HOSPITAL_BASED_OUTPATIENT_CLINIC_OR_DEPARTMENT_OTHER): Payer: Self-pay | Admitting: *Deleted

## 2020-06-20 ENCOUNTER — Emergency Department (HOSPITAL_BASED_OUTPATIENT_CLINIC_OR_DEPARTMENT_OTHER)
Admission: EM | Admit: 2020-06-20 | Discharge: 2020-06-20 | Disposition: A | Discharge status: Home / Self Care | Payer: Medicare HMO | Attending: Emergency Medicine | Admitting: Emergency Medicine

## 2020-06-20 DIAGNOSIS — E1165 Type 2 diabetes mellitus with hyperglycemia: Secondary | ICD-10-CM | POA: Insufficient documentation

## 2020-06-20 DIAGNOSIS — E871 Hypo-osmolality and hyponatremia: Secondary | ICD-10-CM | POA: Insufficient documentation

## 2020-06-20 DIAGNOSIS — F17210 Nicotine dependence, cigarettes, uncomplicated: Secondary | ICD-10-CM | POA: Insufficient documentation

## 2020-06-20 DIAGNOSIS — Z794 Long term (current) use of insulin: Secondary | ICD-10-CM | POA: Insufficient documentation

## 2020-06-20 DIAGNOSIS — M79652 Pain in left thigh: Secondary | ICD-10-CM | POA: Diagnosis not present

## 2020-06-20 DIAGNOSIS — R7989 Other specified abnormal findings of blood chemistry: Secondary | ICD-10-CM

## 2020-06-20 DIAGNOSIS — N50812 Left testicular pain: Secondary | ICD-10-CM

## 2020-06-20 DIAGNOSIS — R0989 Other specified symptoms and signs involving the circulatory and respiratory systems: Secondary | ICD-10-CM | POA: Diagnosis not present

## 2020-06-20 DIAGNOSIS — R739 Hyperglycemia, unspecified: Secondary | ICD-10-CM

## 2020-06-20 DIAGNOSIS — I119 Hypertensive heart disease without heart failure: Secondary | ICD-10-CM | POA: Insufficient documentation

## 2020-06-20 DIAGNOSIS — I2510 Atherosclerotic heart disease of native coronary artery without angina pectoris: Secondary | ICD-10-CM | POA: Insufficient documentation

## 2020-06-20 DIAGNOSIS — R69 Illness, unspecified: Secondary | ICD-10-CM | POA: Diagnosis not present

## 2020-06-20 LAB — URINALYSIS, MICROSCOPIC (REFLEX)

## 2020-06-20 LAB — BASIC METABOLIC PANEL
Anion gap: 11 (ref 5–15)
BUN: 17 mg/dL (ref 6–20)
CO2: 20 mmol/L — ABNORMAL LOW (ref 22–32)
Calcium: 9.2 mg/dL (ref 8.9–10.3)
Chloride: 98 mmol/L (ref 98–111)
Creatinine, Ser: 1.22 mg/dL (ref 0.61–1.24)
GFR, Estimated: >60 mL/min (ref >60)
Glucose, Bld: 458 mg/dL — ABNORMAL HIGH (ref 70–99)
Potassium: 4.5 mmol/L (ref 3.5–5.1)
Sodium: 129 mmol/L — ABNORMAL LOW (ref 135–145)

## 2020-06-20 LAB — CBC WITH DIFFERENTIAL/PLATELET
Abs Immature Granulocytes: 0.03 10*3/uL (ref 0.00–0.07)
Basophils Absolute: 0.1 10*3/uL (ref 0.0–0.1)
Basophils Relative: 1 %
Eosinophils Absolute: 0.2 10*3/uL (ref 0.0–0.5)
Eosinophils Relative: 4 %
HCT: 45.1 % (ref 39.0–52.0)
Hemoglobin: 15.7 g/dL (ref 13.0–17.0)
Immature Granulocytes: 0 %
Lymphocytes Relative: 23 %
Lymphs Abs: 1.6 10*3/uL (ref 0.7–4.0)
MCH: 29.6 pg (ref 26.0–34.0)
MCHC: 34.8 g/dL (ref 30.0–36.0)
MCV: 85.1 fL (ref 80.0–100.0)
Monocytes Absolute: 0.6 10*3/uL (ref 0.1–1.0)
Monocytes Relative: 8 %
Neutro Abs: 4.3 10*3/uL (ref 1.7–7.7)
Neutrophils Relative %: 64 %
Platelets: 253 10*3/uL (ref 150–400)
RBC: 5.3 MIL/uL (ref 4.22–5.81)
RDW: 12.4 % (ref 11.5–15.5)
WBC: 6.7 10*3/uL (ref 4.0–10.5)
nRBC: 0 % (ref 0.0–0.2)

## 2020-06-20 LAB — URINALYSIS, ROUTINE W REFLEX MICROSCOPIC
Bilirubin Urine: NEGATIVE
Glucose, UA: >=500 mg/dL — AB
Hgb urine dipstick: NEGATIVE
Ketones, ur: NEGATIVE mg/dL
Leukocytes,Ua: NEGATIVE
Nitrite: NEGATIVE
Protein, ur: NEGATIVE mg/dL
Specific Gravity, Urine: 1.01 (ref 1.005–1.030)
pH: 6 (ref 5.0–8.0)

## 2020-06-20 IMAGING — US US SCROTUM W/ DOPPLER COMPLETE
1 series · 13 of 25 positions shown · non-contrast
Comparison: Scrotal ultrasound 3 days ago [DATE]. Also
[DATE]

CLINICAL DATA: Left-sided testicular pain.

EXAM:
SCROTAL ULTRASOUND
DOPPLER ULTRASOUND OF THE TESTICLES
TECHNIQUE: Complete ultrasound examination of the testicles, epididymis, and
other scrotal structures was performed. Color and spectral Doppler
ultrasound were also utilized to evaluate blood flow to the
testicles.

[Series 1: us scrotum w/ doppler complete · 13 of 32 slices shown]
[im 1/32]
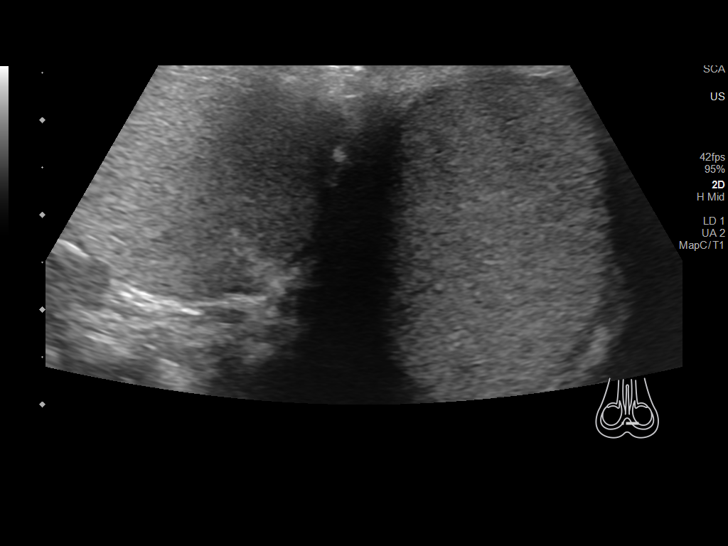
[im 3/32]
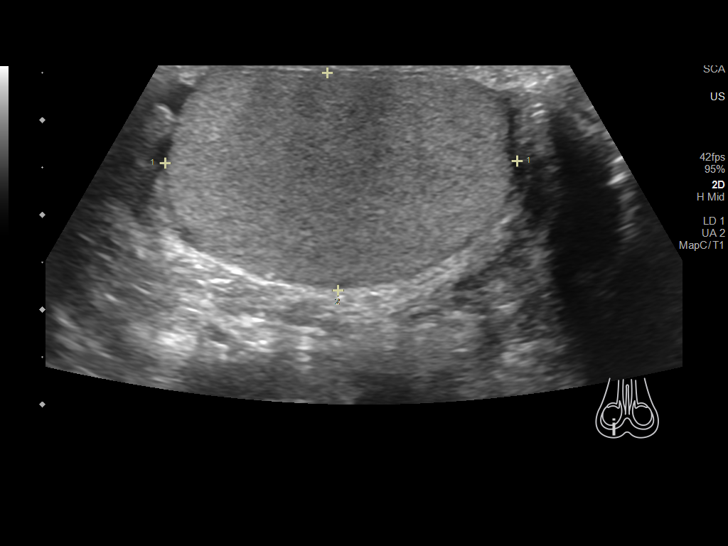
[im 6/32]
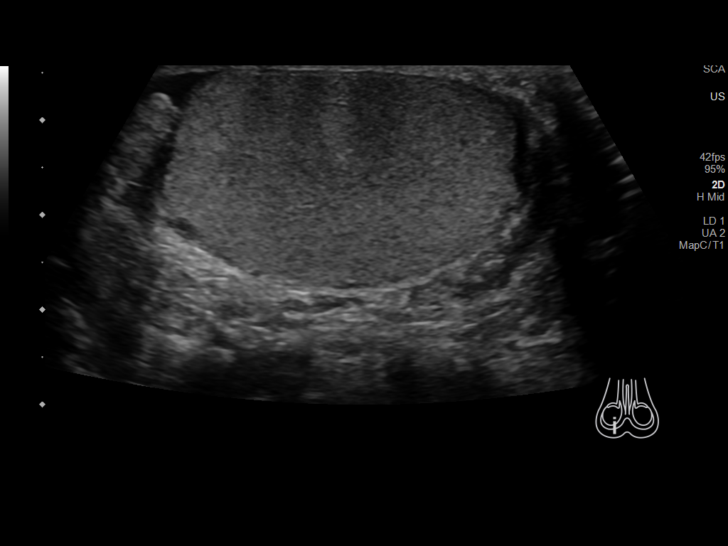
[im 8/32]
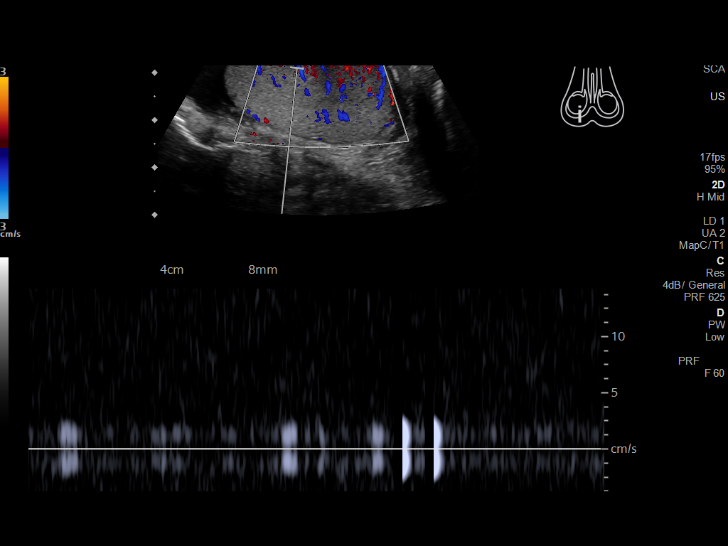
[im 11/32]
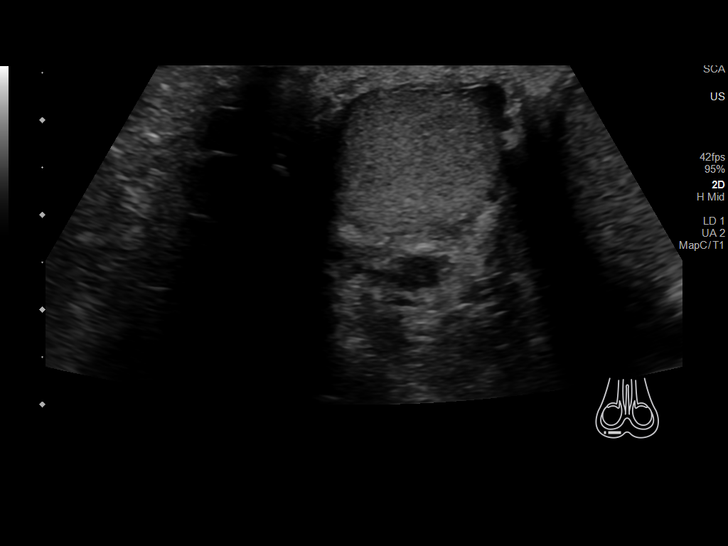
[im 13/32]
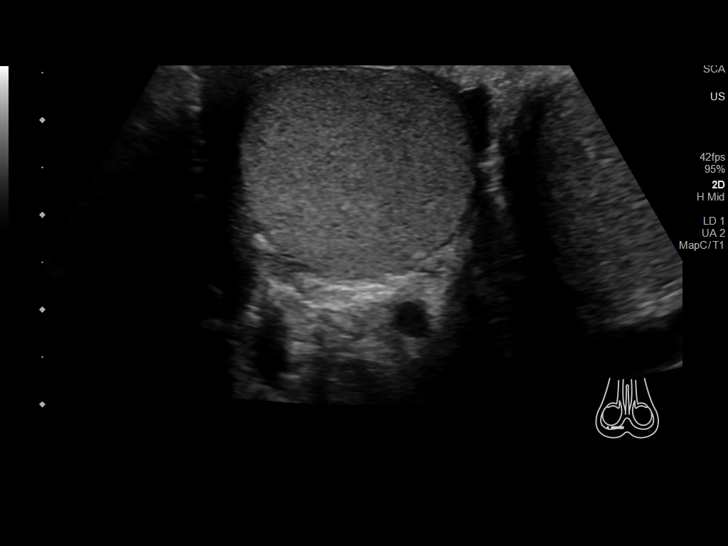
[im 16/32]
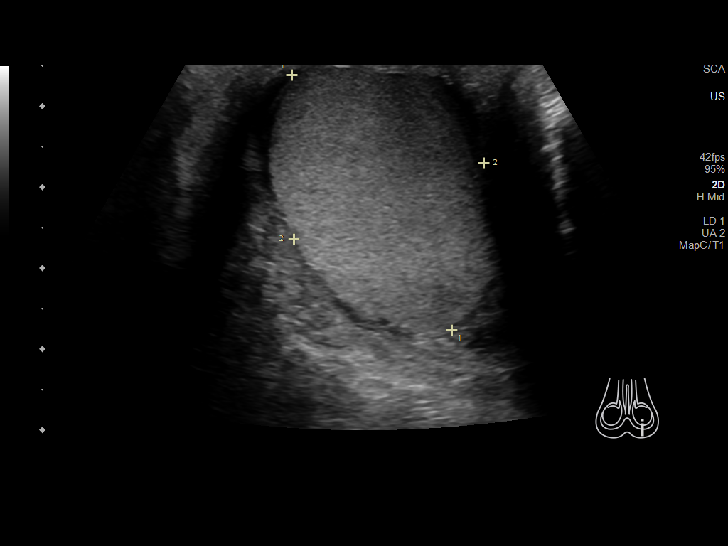
[im 19/32]
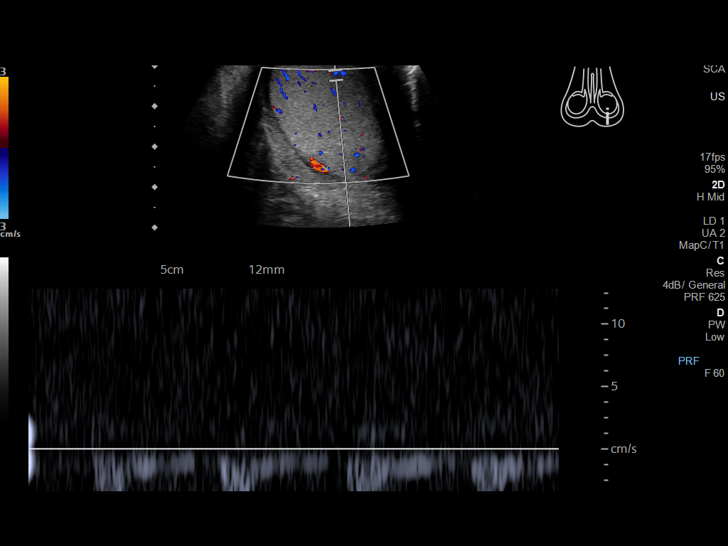
[im 21/32]
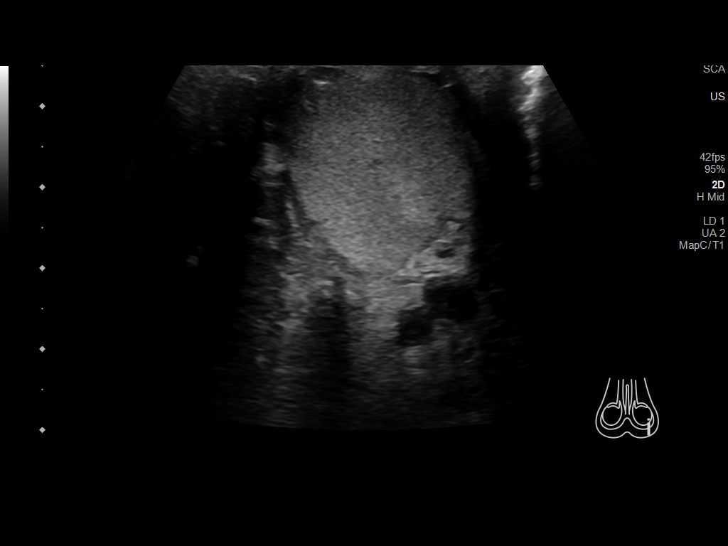
[im 24/32]
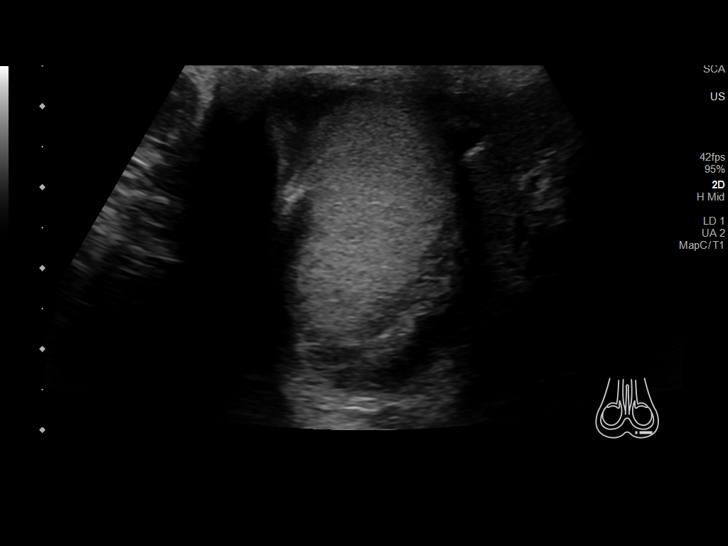
[im 26/32]
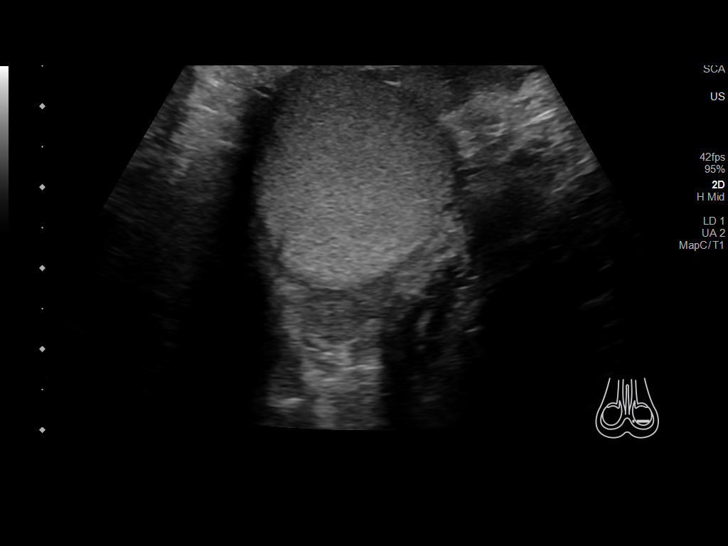
[im 29/32]
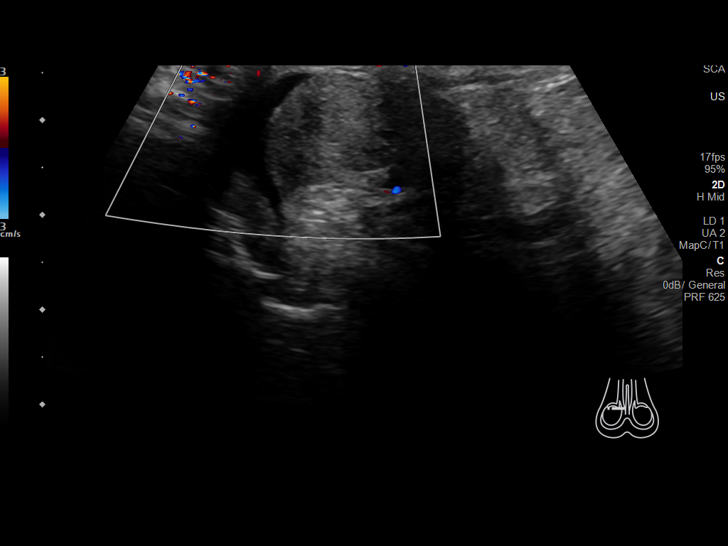
[im 32/32]
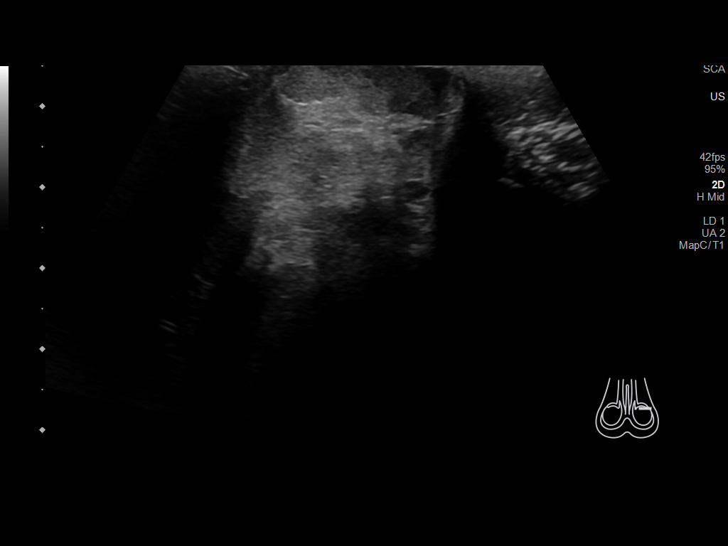

[13 of 25 positions shown; findings below may reference images not displayed]

FINDINGS: Right testicle

Measurements: 3.7 x 2.3 x 2.5 cm. Homogeneous echogenicity. Normal
blood flow. No mass or microlithiasis visualized.

Left testicle

Measurements: 3.7 x 2.5 x 2.7 cm. Homogeneous echogenicity. Normal
blood flow. No mass or microlithiasis visualized.

Right epididymis:  Normal in size and appearance.

Left epididymis: Heterogeneous appearance on prior exam has
improved. No hyperemia.

Hydrocele:  None visualized.

Varicocele:  None visualized.

Pulsed Doppler interrogation of both testes demonstrates normal low
resistance arterial and venous waveforms bilaterally.
IMPRESSION: 1. Heterogeneous echotexture of the left epididymis on recent prior
exam has improved from prior, suggesting resolved inflammation.
2. Otherwise unremarkable sonographic appearance of the scrotum and
testis.
3. Normal blood flow.

## 2020-06-20 IMAGING — US US EXTREM LOW VENOUS*L*
1 series · 14 of 24 positions shown · non-contrast
Comparison: [DATE]

CLINICAL DATA: Left testicular pain

EXAM:
LEFT LOWER EXTREMITY VENOUS DOPPLER ULTRASOUND
TECHNIQUE: Gray-scale sonography with compression, as well as color and duplex
ultrasound, were performed to evaluate the deep venous system(s)
from the level of the common femoral vein through the popliteal and
proximal calf veins.

[Series 1: us extrem low venous*left* · 14 of 33 slices shown]
[im 1/33]
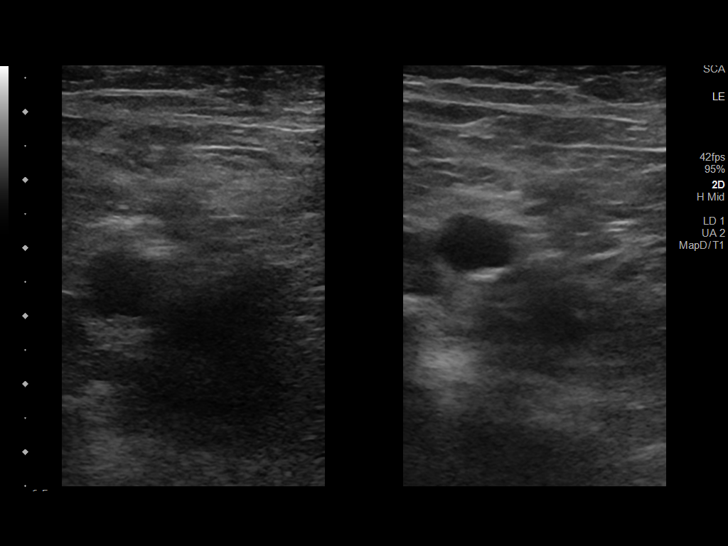
[im 3/33]
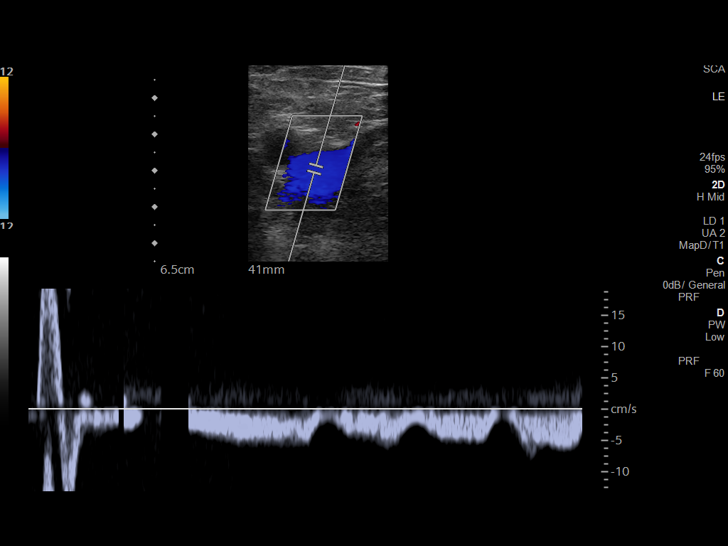
[im 6/33]
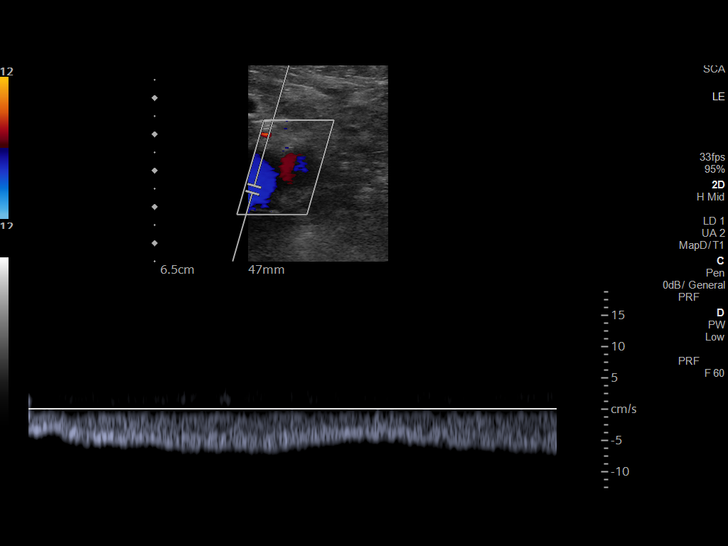
[im 9/33]
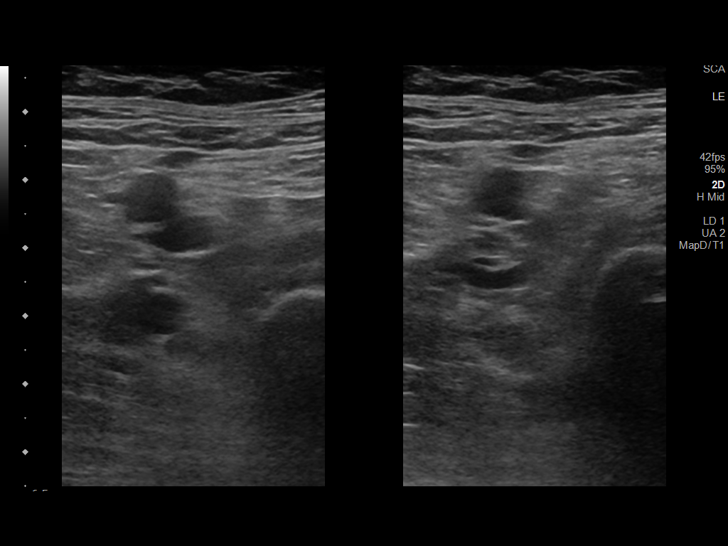
[im 10/33]
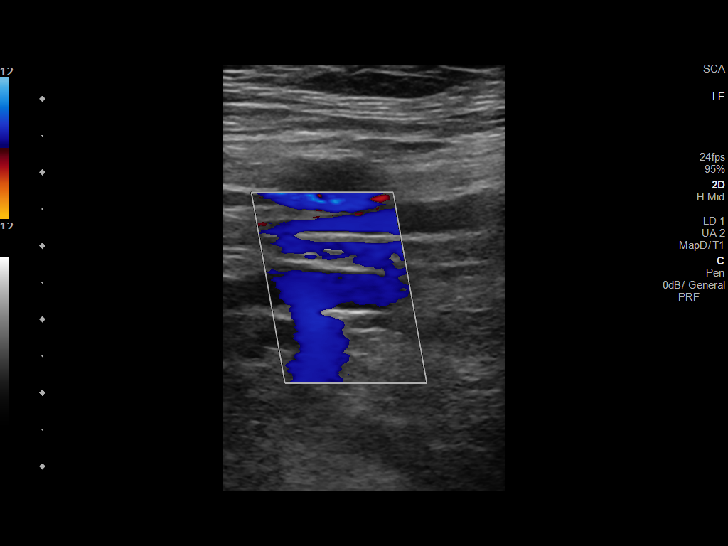
[im 13/33]
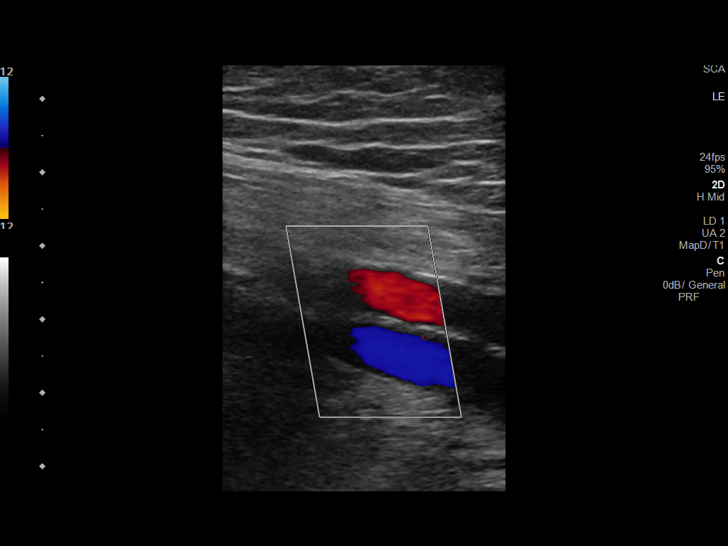
[im 16/33]
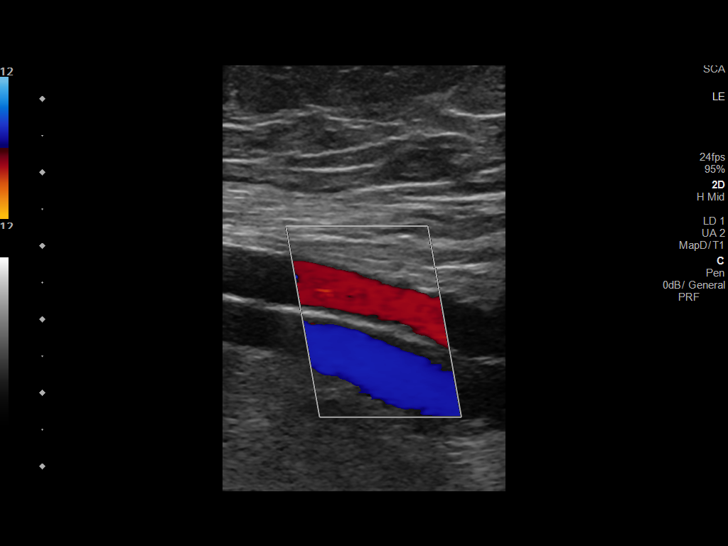
[im 17/33]
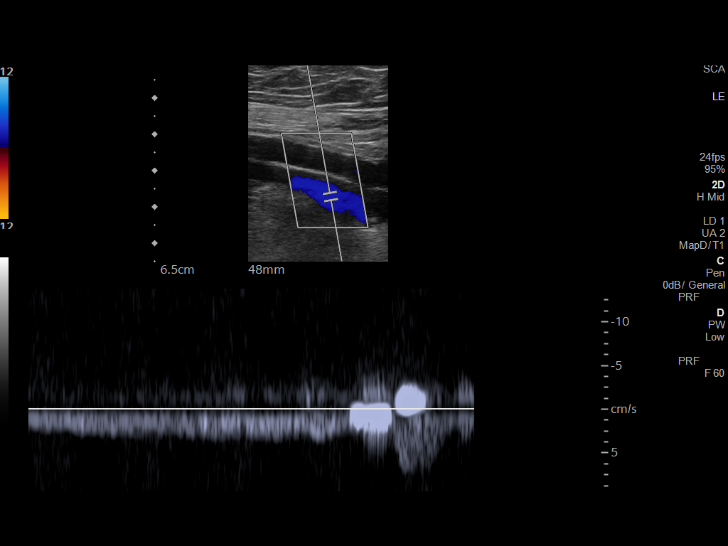
[im 20/33]
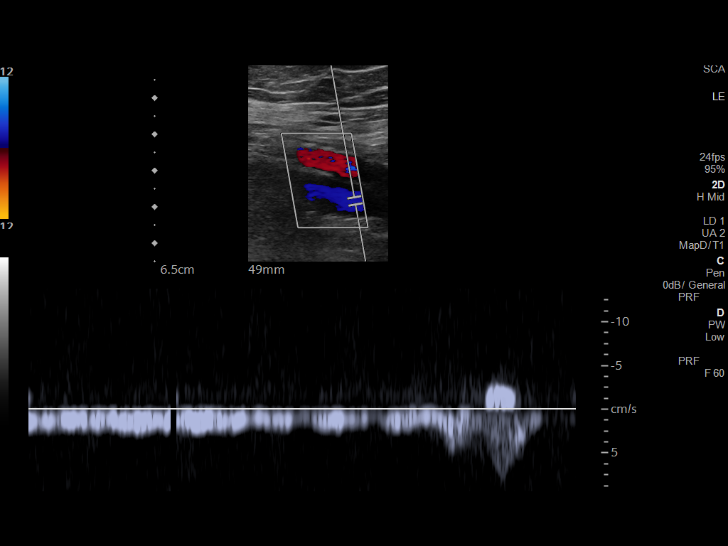
[im 23/33]
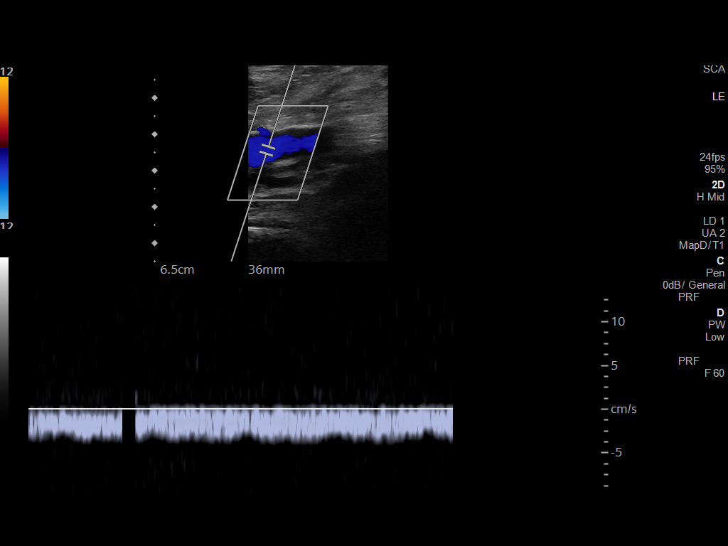
[im 26/33]
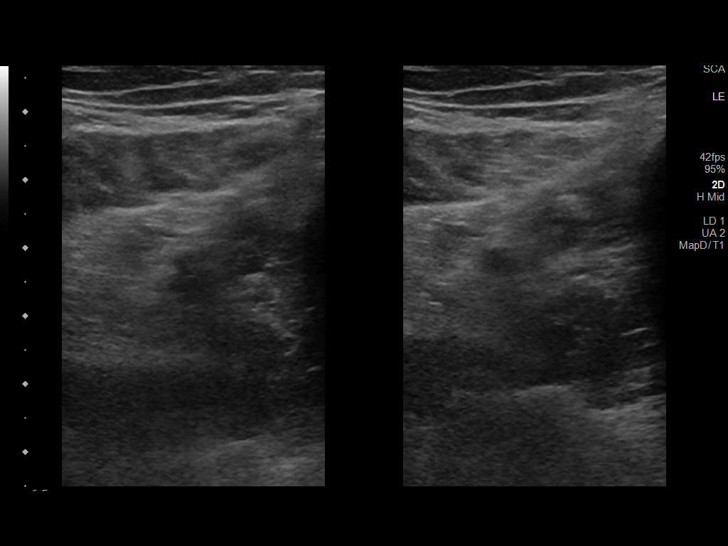
[im 27/33]
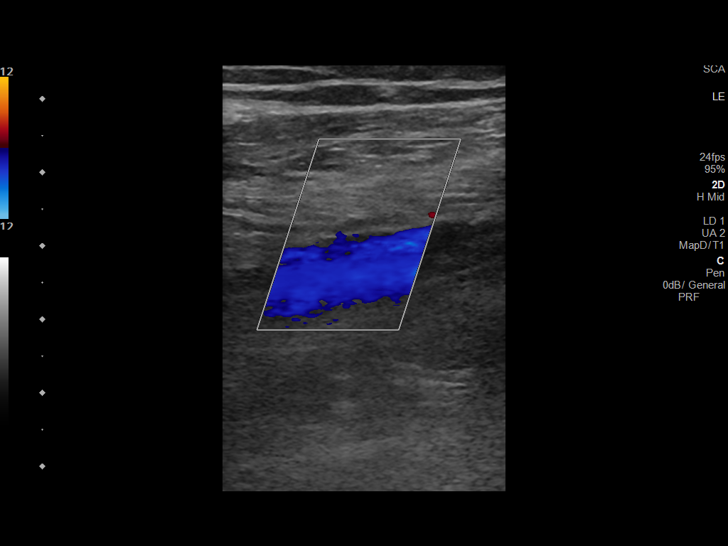
[im 30/33]
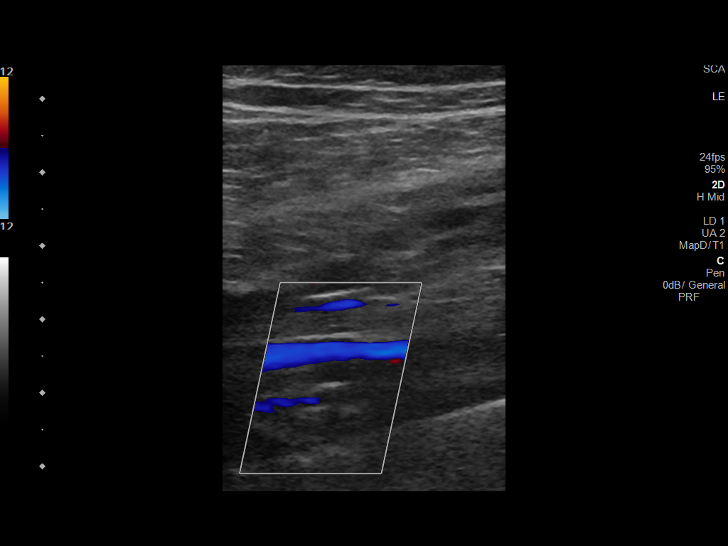
[im 33/33]
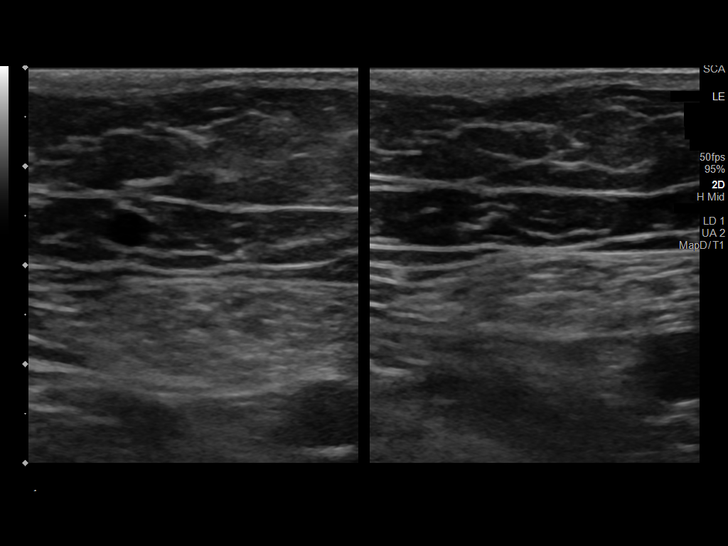

[14 of 24 positions shown; findings below may reference images not displayed]

FINDINGS: VENOUS

Normal compressibility of the common femoral, superficial femoral,
and popliteal veins, as well as the visualized calf veins.
Visualized portions of profunda femoral vein and great saphenous
vein unremarkable. No filling defects to suggest DVT on grayscale or
color Doppler imaging. Doppler waveforms show normal direction of
venous flow, normal respiratory plasticity and response to
augmentation.

Limited views of the contralateral common femoral vein are
unremarkable.

OTHER

None.

Limitations: none
IMPRESSION: No evidence of deep venous thrombosis in the visualized left lower
extremity.

## 2020-06-20 MED ORDER — HYDROCODONE-ACETAMINOPHEN 5-325 MG PO TABS: 1.0000 | ORAL_TABLET | Freq: Four times a day (QID) | ORAL | 0 refills | Status: DC | PRN

## 2020-06-20 NOTE — ED Provider Notes (Signed)
[facility] EMERGENCY DEPARTMENT Provider Note   CSN: [id number] Arrival date & time: 06/20/20  1108     History Chief Complaint  Patient presents with  . Testicle Pain    Tyler Brewer is a 59 y.o. male.  He is here with complaint of left testicular pain that radiated into his groin and into his left hip that caused his leg to buckle.  Started today.  Has been troubled with intermittent left testicular pain over the course of the year and has an appointment with urology in 1 week.  Was last ultrasound 5 days ago and placed on Bactrim for possible epididymitis.  Denies any trauma.  No numbness or tingling in the leg.  No back pain.  The history is provided by the patient.  Testicle Pain This is a recurrent problem. The current episode started 3 to 5 hours ago. The problem occurs constantly. The problem has not changed since onset.Associated symptoms include abdominal pain. Pertinent negatives include no chest pain, no headaches and no shortness of breath. The symptoms are aggravated by bending and twisting. Nothing relieves the symptoms. He has tried nothing for the symptoms. The treatment provided no relief.       Past Medical History:  Diagnosis Date  . Arthritis   . CAD (coronary artery disease)  cardiac cath with moderate disease in a septal branch of the ramus intermedius 04/01/2012  . Depression   . Diabetes mellitus    poorly controlled by his report  . History of narcotic addiction ([place])    past history of back pain  . Hypercholesteremia   . Hypertension   . IBS (irritable bowel syndrome)   . 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[place])   . Neuropathy   . Obesity    Max weight was 390  . OSA on CPAP   . Panic attacks   . Testosterone deficiency   . Vertigo     Patient Active Problem List   Diagnosis Date Noted  . Balance problem 03/11/2020  . Tinea cruris 03/11/2020  . Cellulitis of left groin 03/11/2020  . Acute pain of right shoulder 03/11/2020  .  Interstitial lung disease ([place]) 05/02/2017  . Pansinusitis 09/26/2016  . Diabetic polyneuropathy associated with diabetes mellitus due to underlying condition () 05/08/2016  . Methamphetamine use disorder, severe, dependence ([place]) 02/11/2016  . Substance induced mood disorder ([place]) 02/11/2016  . Exertional dyspnea 11/30/2015  . ADD (attention deficit disorder) 09/29/2015  . Binge eating 09/29/2015  . Syncope 05/05/2012  . Diarrhea 05/05/2012  . Panic attacks 05/05/2012  . OSA (obstructive sleep apnea) 05/05/2012  . HTN (hypertension) 05/05/2012  . Diabetes mellitus, type II ([place]) 05/05/2012  . Dyslipidemia 05/05/2012  . CAD (coronary artery disease)  cardiac cath with moderate disease in a septal branch of the ramus intermedius 04/01/2012  . Chest pain 04/01/2012  . Drug abuse and dependence ([place]) 04/01/2012  . Family history of coronary artery disease 03/31/2012  . Sleep apnea, on C-pap 03/31/2012  . Hyperlipemia 03/30/2012  . HTN (hypertension), benign 03/30/2012  . Morbid obesity ([place]) 03/30/2012  . DM type 2, uncontrolled, with neuropathy ([place]) 03/30/2012  . Depression with anxiety 03/30/2012    Past Surgical History:  Procedure Laterality Date  . CARDIAC CATHETERIZATION    . LEFT HEART CATHETERIZATION WITH CORONARY ANGIOGRAM N/A 03/31/2012   Procedure: LEFT HEART CATHETERIZATION WITH CORONARY ANGIOGRAM;  Surgeon: Leonie Man, MD;  Location: [facility] CATH LAB;  Service: Cardiovascular;  Laterality: N/A;  Family History  Problem Relation Age of Onset  . Diabetes type II Father   . Hypertension Father   . Pancreatic disease Father 20       Deceased  . Healthy Mother   . Healthy Sister   . Healthy Son   . Healthy Daughter     Social History   Tobacco Use  . Smoking status: Current Every Day Smoker    Packs/day: 0.50    Types: Cigarettes  . Smokeless tobacco: Never Used  Vaping Use  . Vaping Use: Never used  Substance Use Topics  . Alcohol use: No     Alcohol/week: 0.0 standard drinks  . Drug use: Yes    Types: Methamphetamines    Home Medications Prior to Admission medications   Medication Sig Start Date End Date Taking? Authorizing Provider  acetaminophen (TYLENOL) 325 MG tablet Take 650 mg by mouth every 6 (six) hours as needed for mild pain, fever or headache.    [provider]  ciprofloxacin (CIPRO) 500 MG tablet Take 1 tablet (500 mg total) by mouth 2 (two) times daily. 06/06/20   Milton Ferguson, MD  gabapentin (NEURONTIN) 300 MG capsule Take 2 tablets in the morning, 1 tablet in the afternoon, and 1 tablet at bedtime. Patient taking differently: Take 300-600 mg by mouth See admin instructions. 600mg  in the morning 300mg  in the afternoon 300mg  at bedtime 04/29/20   Narda Amber K, DO  HYDROcodone-acetaminophen (NORCO) 5-325 MG tablet Take 1 tablet by mouth every 6 (six) hours as needed for severe pain. Patient not taking: Reported on 06/06/2020 04/14/20   Molpus, John, MD  Insulin Lispro Prot & Lispro (HUMALOG MIX 75/25 KWIKPEN) (75-25) 100 UNIT/ML Kwikpen Inject 10 Units into the skin 2 (two) times daily. Patient not taking: Reported on 06/06/2020 05/02/17   Carollee Herter, Alferd Apa, DO  insulin NPH-regular Human (70-30) 100 UNIT/ML injection Inject 60 Units into the skin 3 (three) times daily.    [provider]  levofloxacin (LEVAQUIN) 500 MG tablet Take 1 tablet daily starting Friday, 04/15/2020. Patient not taking: Reported on 06/06/2020 04/14/20   Molpus, John, MD  lidocaine (LIDODERM) 5 % Place 1 patch onto the skin daily. Remove & Discard patch within 12 hours or as directed by MD Patient not taking: Reported on 06/06/2020 05/31/20   Palumbo, April, MD  losartan (COZAAR) 50 MG tablet Take 1 tablet (50 mg total) by mouth daily. 03/10/20   [name], DO  naproxen sodium (ALEVE) 220 MG tablet Take 440-660 mg by mouth 2 (two) times daily as needed (pain/headache).    [provider]  nystatin  (NYSTATIN) powder Apply 1 application topically 3 (three) times daily. Patient not taking: Reported on 04/29/2020 03/10/20   Carollee Herter, Alferd Apa, DO  pravastatin (PRAVACHOL) 40 MG tablet Take 1 tablet (40 mg total) by mouth daily. 03/10/20   [name], DO  sulfamethoxazole-trimethoprim (BACTRIM DS) 800-160 MG tablet Take 1 tablet by mouth 2 (two) times daily for 10 days. 06/16/20 06/26/20  Marcello Fennel, PA-C  tamsulosin (FLOMAX) 0.4 MG CAPS capsule Take 1 capsule (0.4 mg total) by mouth daily. 03/10/20   [name], DO    Allergies    Gabapentin  Review of Systems   Review of Systems  Constitutional: Negative for fever.  HENT: Negative for sore throat.   Eyes: Negative for visual disturbance.  Respiratory: Negative for shortness of breath.   Cardiovascular: Negative for chest pain.  Gastrointestinal:  Positive for abdominal pain.  Genitourinary: Positive for testicular pain. Negative for dysuria.  Musculoskeletal: Negative for back pain.  Skin: Negative for rash.  Neurological: Negative for headaches.    Physical Exam Updated Vital Signs BP (!) 168/105   Pulse 88   Temp 98.3 F (36.8 C) (Oral)   Resp 16   Ht 5\' 11"  (1.803 m)   Wt 130.2 kg   SpO2 98%   BMI 40.03 kg/m   Physical Exam Vitals and nursing note reviewed.  Constitutional:      Appearance: He is well-developed.  HENT:     Head: Normocephalic and atraumatic.  Eyes:     Conjunctiva/sclera: Conjunctivae normal.  Cardiovascular:     Rate and Rhythm: Normal rate and regular rhythm.     Heart sounds: No murmur heard.   Pulmonary:     Effort: Pulmonary effort is normal. No respiratory distress.     Breath sounds: Normal breath sounds.  Abdominal:     Palpations: Abdomen is soft.     Tenderness: There is no abdominal tenderness.  Genitourinary:    Comments: Uncircumcised male.  Right testicle nontender normal lie.  Left testicle tender normal lie no enlargement.  No hernia  appreciated.  No scrotal lesions noted. Musculoskeletal:        General: Tenderness present. No deformity.     Cervical back: Neck supple.     Comments: He has some tenderness through his left groin and into his left thigh.  Distal pulses and sensation intact.  No asymmetric swelling.  No open wounds.  Skin:    General: Skin is warm and dry.     Capillary Refill: Capillary refill takes less than 2 seconds.  Neurological:     General: No focal deficit present.     Mental Status: He is alert.     Sensory: No sensory deficit.     Motor: No weakness.     ED Results / Procedures / Treatments   Labs (all labs ordered are listed, but only abnormal results are displayed) Labs Reviewed  BASIC METABOLIC PANEL - Abnormal; Notable for the following components:      Result Value   Sodium 129 (*)    CO2 20 (*)    Glucose, Bld 458 (*)    All other components within normal limits  URINALYSIS, ROUTINE W REFLEX MICROSCOPIC - Abnormal; Notable for the following components:   Glucose, UA >=500 (*)    All other components within normal limits  URINALYSIS, MICROSCOPIC (REFLEX) - Abnormal; Notable for the following components:   Bacteria, UA RARE (*)    All other components within normal limits  CBC WITH DIFFERENTIAL/PLATELET    EKG None  Radiology US Venous Img Lower  Left (DVT Study)  Result Date: 06/20/2020 CLINICAL DATA:  Left testicular pain EXAM: LEFT LOWER EXTREMITY VENOUS DOPPLER ULTRASOUND TECHNIQUE: Gray-scale sonography with compression, as well as color and duplex ultrasound, were performed to evaluate the deep venous system(s) from the level of the common femoral vein through the popliteal and proximal calf veins. COMPARISON:  11/21/2015 FINDINGS: VENOUS Normal compressibility of the common femoral, superficial femoral, and popliteal veins, as well as the visualized calf veins. Visualized portions of profunda femoral vein and great saphenous vein unremarkable. No filling defects to  suggest DVT on grayscale or color Doppler imaging. Doppler waveforms show normal direction of venous flow, normal respiratory plasticity and response to augmentation. Limited views of the contralateral common femoral vein are unremarkable. OTHER None. Limitations: none IMPRESSION:  No evidence of deep venous thrombosis in the visualized left lower extremity. Electronically Signed   By: Julian Hy M.D.   On: 06/20/2020 15:02   US SCROTUM W/DOPPLER  Result Date: 06/20/2020 CLINICAL DATA:  Left-sided testicular pain. EXAM: SCROTAL ULTRASOUND DOPPLER ULTRASOUND OF THE TESTICLES TECHNIQUE: Complete ultrasound examination of the testicles, epididymis, and other scrotal structures was performed. Color and spectral Doppler ultrasound were also utilized to evaluate blood flow to the testicles. COMPARISON:  Scrotal ultrasound 3 days ago 04/16/2020. Also 01/21/2020 FINDINGS: Right testicle Measurements: 3.7 x 2.3 x 2.5 cm. Homogeneous echogenicity. Normal blood flow. No mass or microlithiasis visualized. Left testicle Measurements: 3.7 x 2.5 x 2.7 cm. Homogeneous echogenicity. Normal blood flow. No mass or microlithiasis visualized. Right epididymis:  Normal in size and appearance. Left epididymis: Heterogeneous appearance on prior exam has improved. No hyperemia. Hydrocele:  None visualized. Varicocele:  None visualized. Pulsed Doppler interrogation of both testes demonstrates normal low resistance arterial and venous waveforms bilaterally. IMPRESSION: 1. Heterogeneous echotexture of the left epididymis on recent prior exam has improved from prior, suggesting resolved inflammation. 2. Otherwise unremarkable sonographic appearance of the scrotum and testis. 3. Normal blood flow. Electronically Signed   By: Keith Rake M.D.   On: 06/20/2020 15:10    Procedures Procedures (including critical care time)  Medications Ordered in ED Medications - No data to display  ED Course  I have reviewed the triage  vital signs and the nursing notes.  Pertinent labs & imaging results that were available during my care of the patient were reviewed by me and considered in my medical decision making (see chart for details).  Clinical Course as of Jun 21 1739  Mon Jun 20, 2020  1418 Patient sodium low here but think this is pseudohyponatremia due to his elevated glucose.   [MB]    Clinical Course User Index [MB] Hayden Rasmussen, MD   MDM Rules/Calculators/A&P                         This patient complains of left testicular pain and left thigh pain; this involves an extensive number of treatment Options and is a complaint that carries with it a high risk of complications and Morbidity. The differential includes epididymitis, orchitis, torsion, musculoskeletal, radicular pain, ureterolithiasis, DVT  I ordered, reviewed and interpreted labs, which included CBC showing normal white count normal hemoglobin, chemistries with elevated glucose and low sodium likely pseudohyponatremia, low bicarb but normal gap, urinalysis without signs of hematuria or infection, is spilling glucose I ordered imaging studies which included testicular ultrasound and DVT study and I independently    visualized and interpreted imaging which showed improved inflammatory changes of epididymitis from prior, no acute DVT Previous records obtained and reviewed in epic including at least 2 visits to the ED for testicular pain and ultrasounds.  Most recently 4 days ago.  He is still on antibiotics.  He has a follow-up with urology next week.  Recommended continued treatment with antibiotics and will provide a small prescription of some pain medicine.  Return instructions discussed.    Final Clinical Impression(s) / ED Diagnoses Final diagnoses:  Left testicular pain  Left thigh pain  Hyperglycemia  Pseudohyponatremia    Rx / DC Orders ED Discharge Orders    None       Hayden Rasmussen, MD 06/20/20 1744

## 2020-06-20 NOTE — Discharge Instructions (Addendum)
You were seen in the emergency department for evaluation of left testicular pain and left thigh pain.  Your urinalysis did not show any obvious signs of infection and your blood work does show that your sugar was elevated.  You had an ultrasound of your leg that did not show any blood clot.  You also had an ultrasound of your scrotum that showed improved signs of inflammation from your last ultrasound.  Please follow-up with your primary care doctor and the urologist.

## 2020-06-20 NOTE — ED Triage Notes (Signed)
Left testicular pain today and several times in the past. U/S has been negative. States pain is going into his left leg causing him to almost pass out. He has been referred to a urologist but states he does not have time to see one.

## 2020-06-23 ENCOUNTER — Encounter (HOSPITAL_COMMUNITY): Payer: Self-pay | Admitting: Emergency Medicine

## 2020-06-23 ENCOUNTER — Other Ambulatory Visit: Payer: Self-pay

## 2020-06-23 ENCOUNTER — Emergency Department (HOSPITAL_COMMUNITY): Payer: Medicare HMO

## 2020-06-23 ENCOUNTER — Emergency Department (HOSPITAL_COMMUNITY)
Admission: EM | Admit: 2020-06-23 | Discharge: 2020-06-23 | Disposition: A | Discharge status: Home / Self Care | Payer: Medicare HMO | Attending: Emergency Medicine | Admitting: Emergency Medicine

## 2020-06-23 DIAGNOSIS — Z79899 Other long term (current) drug therapy: Secondary | ICD-10-CM | POA: Diagnosis not present

## 2020-06-23 DIAGNOSIS — F17210 Nicotine dependence, cigarettes, uncomplicated: Secondary | ICD-10-CM | POA: Insufficient documentation

## 2020-06-23 DIAGNOSIS — M545 Low back pain, unspecified: Secondary | ICD-10-CM | POA: Diagnosis not present

## 2020-06-23 DIAGNOSIS — N200 Calculus of kidney: Secondary | ICD-10-CM | POA: Diagnosis not present

## 2020-06-23 DIAGNOSIS — I10 Essential (primary) hypertension: Secondary | ICD-10-CM | POA: Insufficient documentation

## 2020-06-23 DIAGNOSIS — N50812 Left testicular pain: Secondary | ICD-10-CM | POA: Diagnosis not present

## 2020-06-23 DIAGNOSIS — E1140 Type 2 diabetes mellitus with diabetic neuropathy, unspecified: Secondary | ICD-10-CM | POA: Diagnosis not present

## 2020-06-23 DIAGNOSIS — M25552 Pain in left hip: Secondary | ICD-10-CM

## 2020-06-23 DIAGNOSIS — W010XXA Fall on same level from slipping, tripping and stumbling without subsequent striking against object, initial encounter: Secondary | ICD-10-CM | POA: Insufficient documentation

## 2020-06-23 DIAGNOSIS — Z794 Long term (current) use of insulin: Secondary | ICD-10-CM | POA: Diagnosis not present

## 2020-06-23 DIAGNOSIS — I2510 Atherosclerotic heart disease of native coronary artery without angina pectoris: Secondary | ICD-10-CM | POA: Insufficient documentation

## 2020-06-23 DIAGNOSIS — R69 Illness, unspecified: Secondary | ICD-10-CM | POA: Diagnosis not present

## 2020-06-23 DIAGNOSIS — R1032 Left lower quadrant pain: Secondary | ICD-10-CM | POA: Diagnosis not present

## 2020-06-23 IMAGING — CR DG HIP (WITH OR WITHOUT PELVIS) 2-3V*L*
3 series · 3 of 3 positions shown · non-contrast
Comparison: [DATE].

CLINICAL DATA: Left hip pain.

EXAM:
DG HIP (WITH OR WITHOUT PELVIS) 2-3V LEFT

[t pelvis ap]
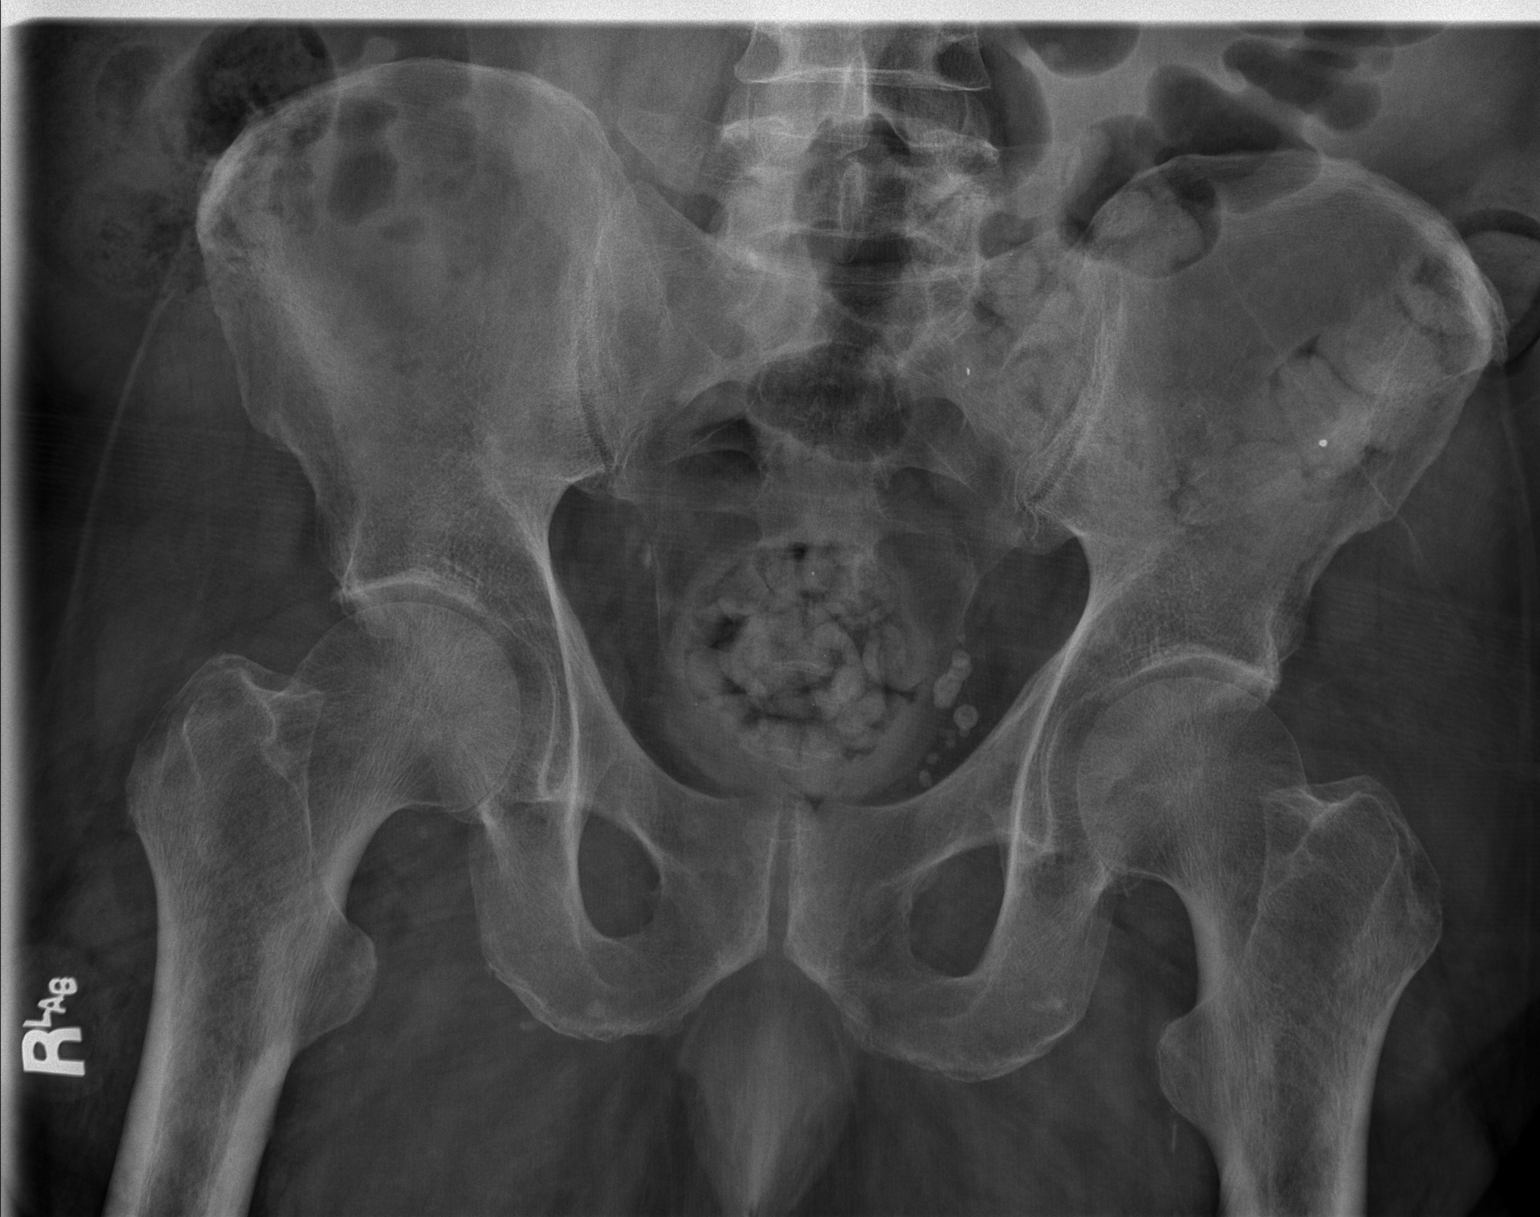

[t hip ap left]
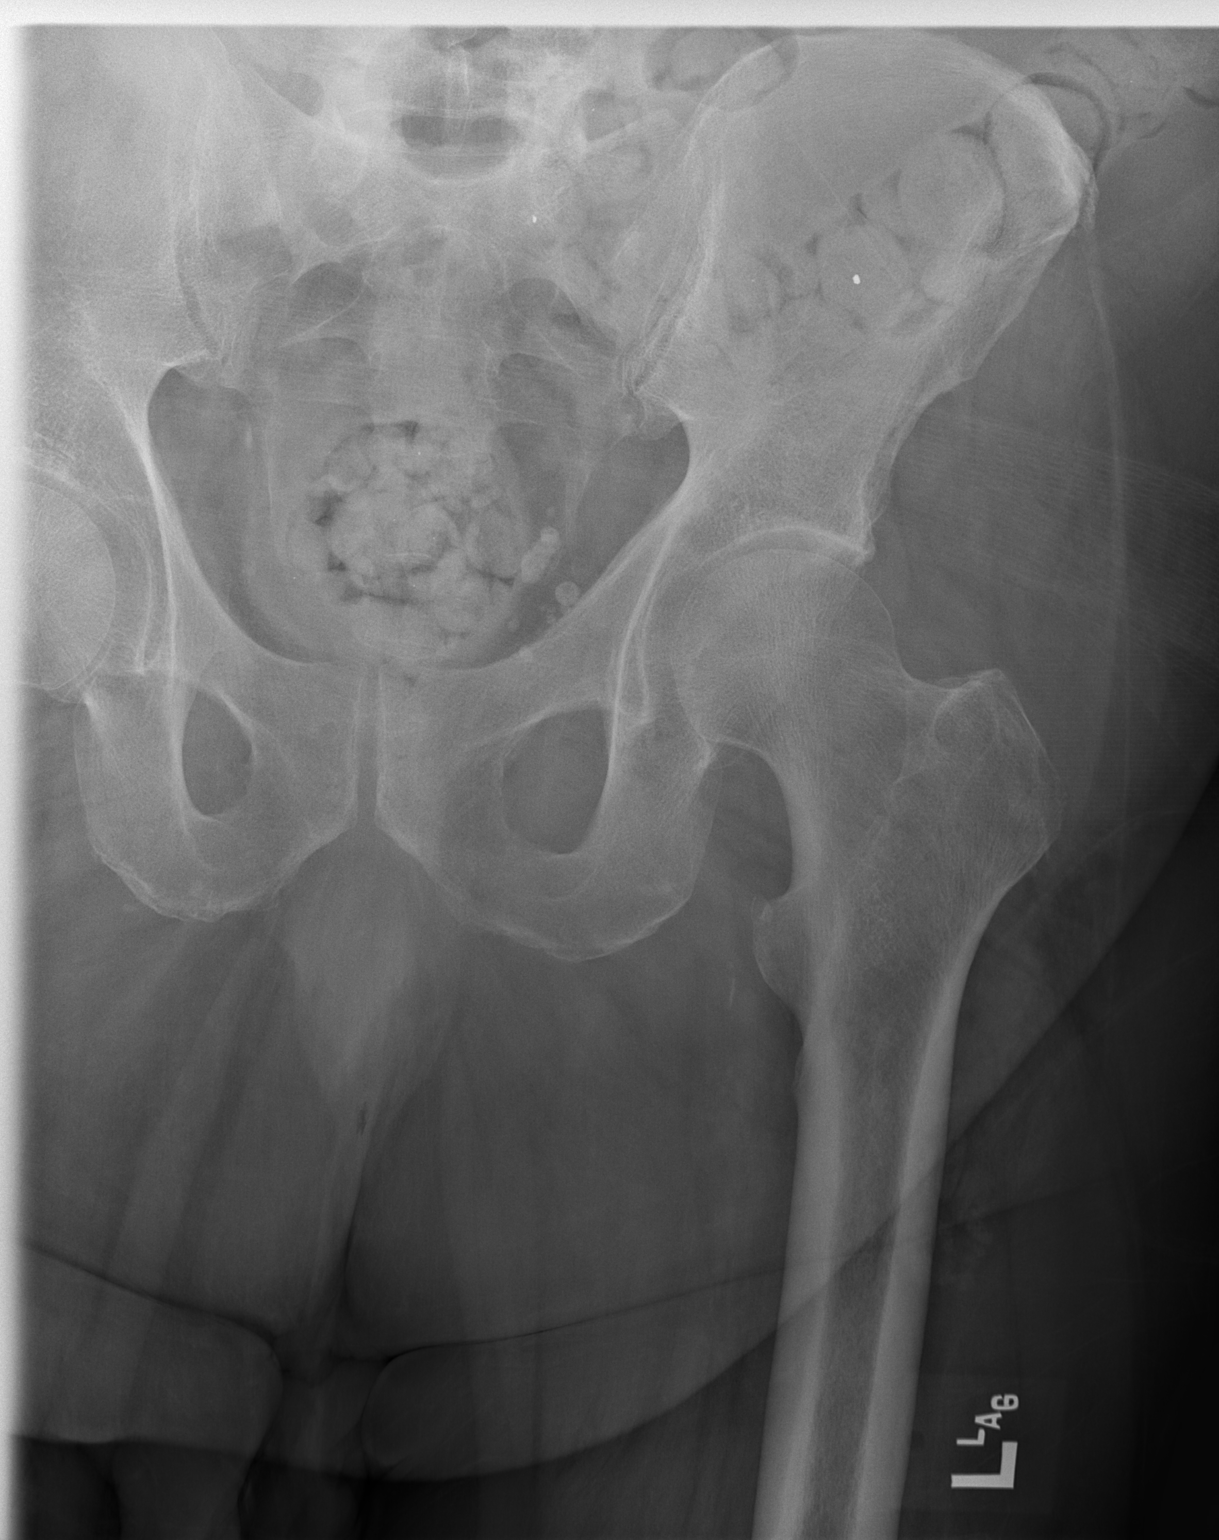

[t hip frog leg left]
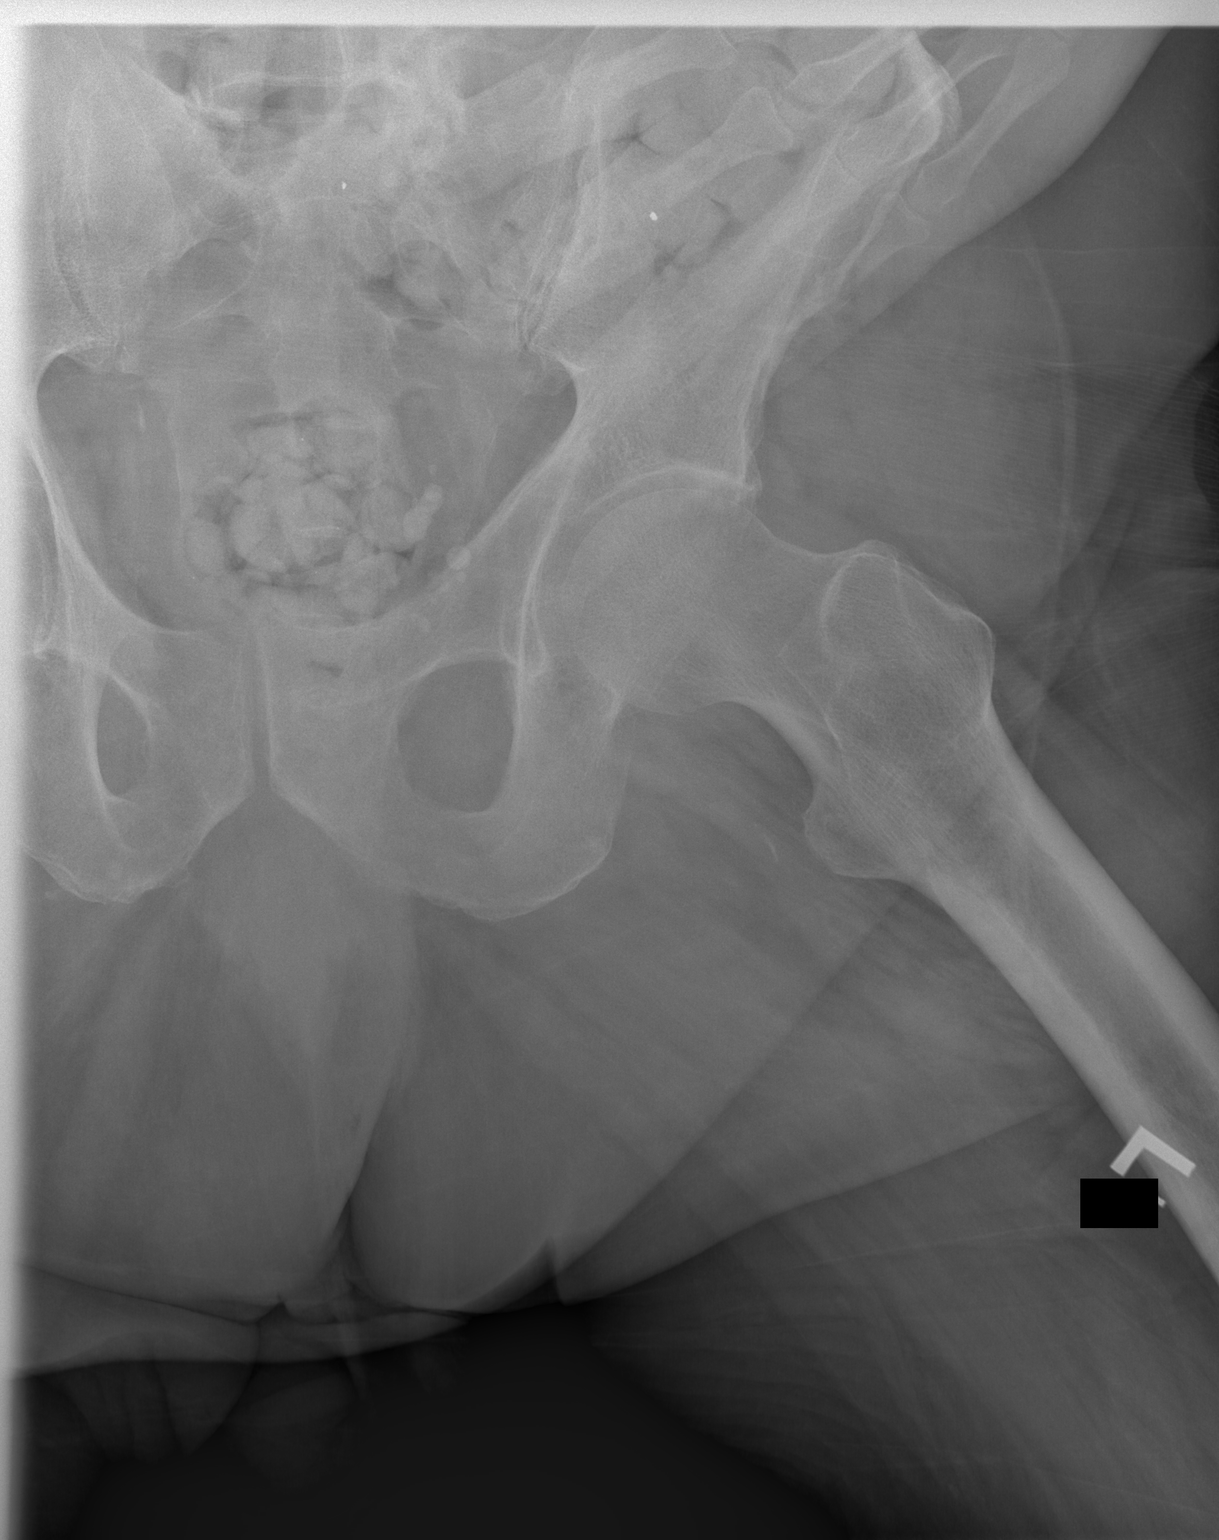

[3 of 3 positions shown; findings below may reference images not displayed]

FINDINGS: Mild degenerative changes both hips. No acute bony or joint
abnormality. No evidence of fracture dislocation. Pelvic
calcifications consistent phleboliths. Mild peripheral vascular
calcification.
IMPRESSION: Mild degenerative changes both hips.  No acute abnormality.

## 2020-06-23 IMAGING — CR DG LUMBAR SPINE COMPLETE 4+V
5 series · 5 of 5 positions shown · non-contrast
Comparison: Abdomen and pelvis CT [DATE]

CLINICAL DATA: Fall 3 weeks ago with subsequent pain radiating down
the left leg

EXAM:
LUMBAR SPINE - COMPLETE 4+ VIEW

[t lumbar spine ap]
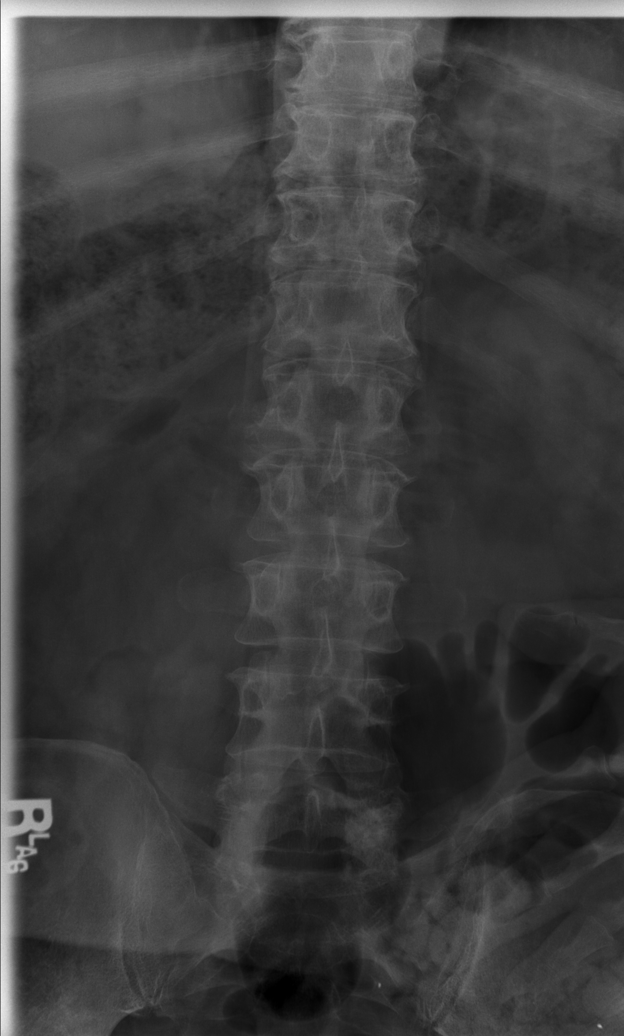

[t lumbar spine obl (1 of 2)]
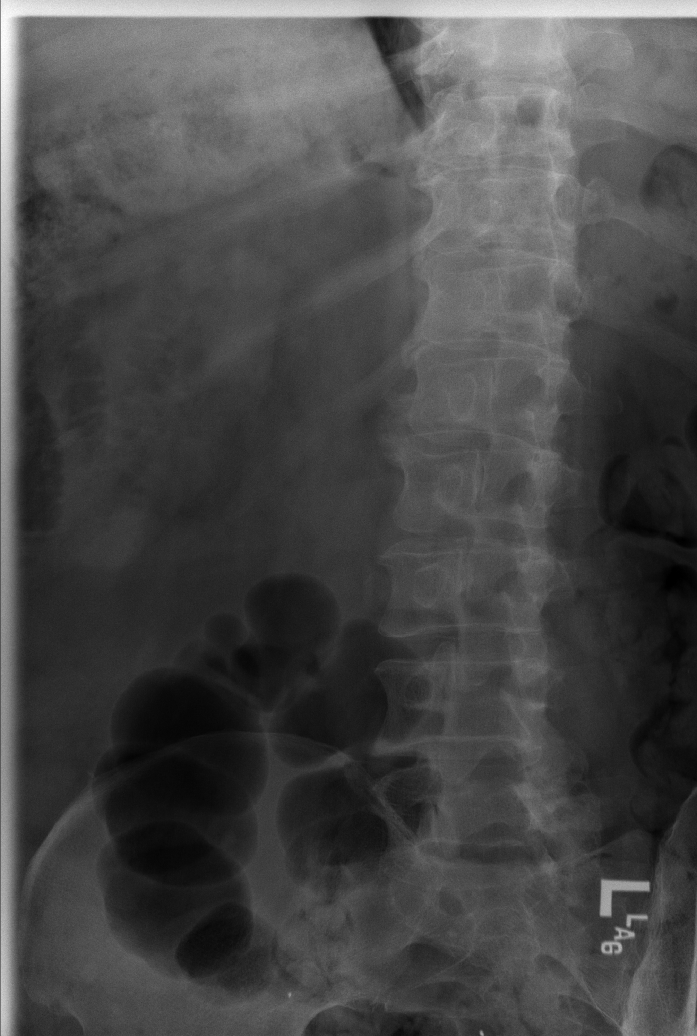

[t lumbar spine obl (2 of 2)]
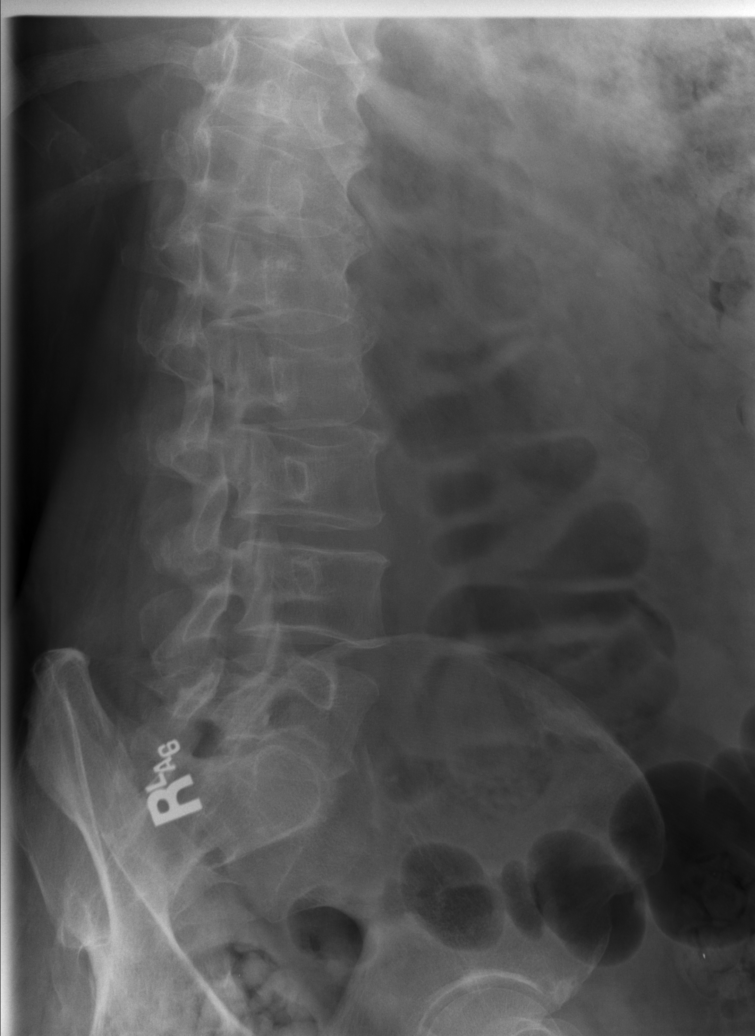

[t lumbar spine lat]
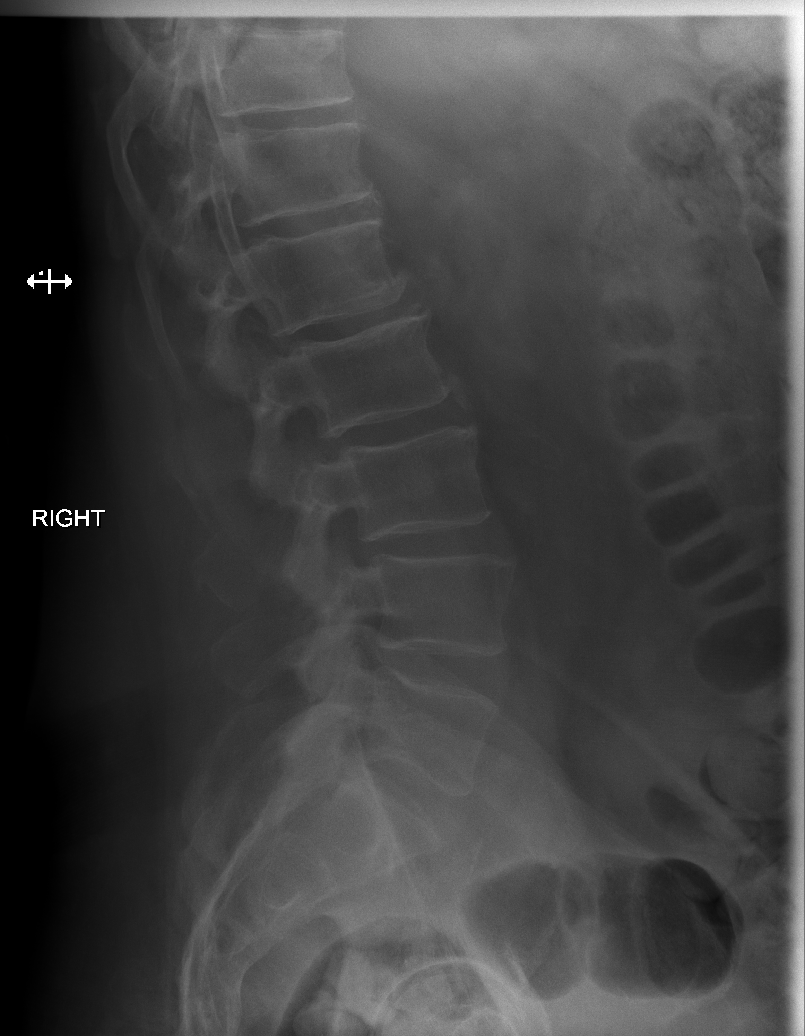

[t lumbar l-5 s-1 spot]
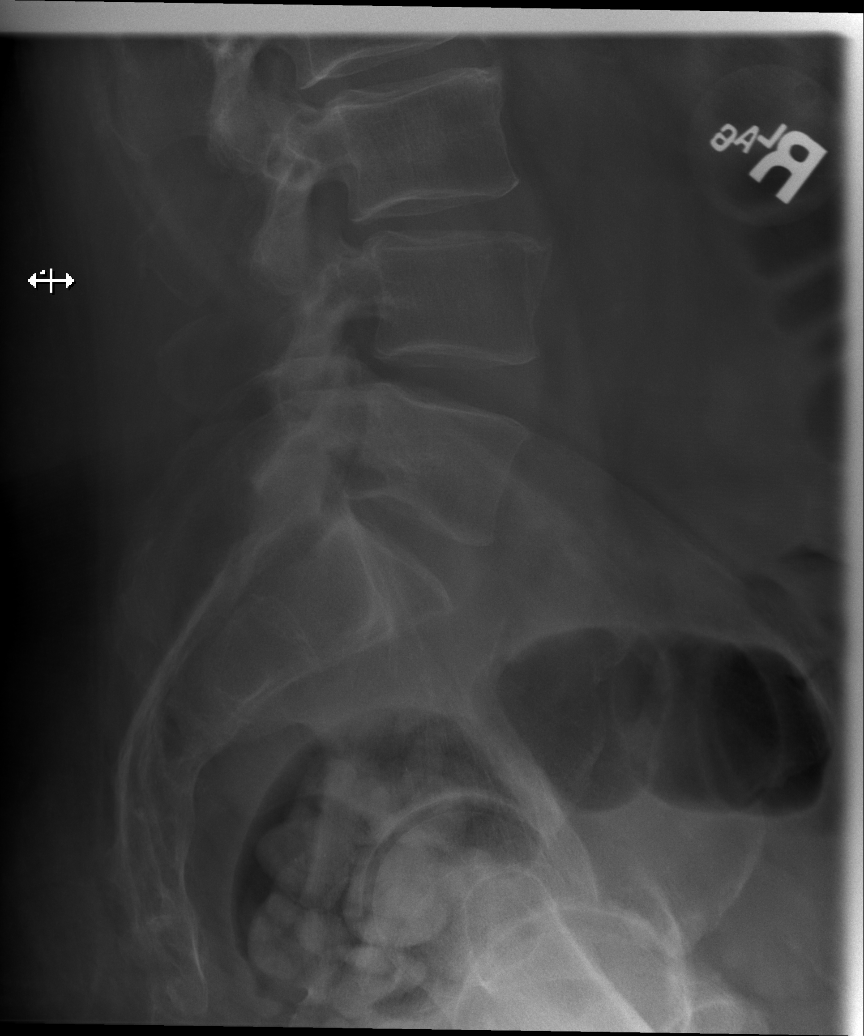

[5 of 5 positions shown; findings below may reference images not displayed]

FINDINGS: No fracture or traumatic malalignment. No focal disc space
narrowing. Sclerosis at the left L5-S1 facet is stable from a [9S]
abdominal CT and attributed to facet osteoarthritis. Vague rounded
sclerotic focus in the L3 body, also stable from [9S].
IMPRESSION: No acute finding or change from CT last year.

## 2020-06-23 MED ORDER — ACETAMINOPHEN 500 MG PO TABS
1000.0000 mg | ORAL_TABLET | Freq: Once | ORAL | Status: AC
  Administered 2020-06-23: 1000 mg via ORAL
  Filled 2020-06-23: qty 2

## 2020-06-23 MED ORDER — OXYCODONE HCL 5 MG PO TABS
5.0000 mg | ORAL_TABLET | Freq: Once | ORAL | Status: AC
  Administered 2020-06-23: 5 mg via ORAL
  Filled 2020-06-23: qty 1

## 2020-06-23 MED ORDER — KETOROLAC TROMETHAMINE 60 MG/2ML IM SOLN
15.0000 mg | Freq: Once | INTRAMUSCULAR | Status: AC
  Administered 2020-06-23: 15 mg via INTRAMUSCULAR
  Filled 2020-06-23: qty 2

## 2020-06-23 MED ORDER — DIAZEPAM 5 MG PO TABS
5.0000 mg | ORAL_TABLET | Freq: Once | ORAL | Status: AC
  Administered 2020-06-23: 5 mg via ORAL
  Filled 2020-06-23: qty 1

## 2020-06-23 NOTE — ED Provider Notes (Signed)
[facility] DEPT Provider Note   CSN: [id number] Arrival date & time: 06/23/20  1740     History Chief Complaint  Patient presents with  . Fall    Tyler Brewer is a 59 y.o. male.  59 yo M with a chief complaints of left hip pain.  This been going on for the past few weeks or so.  He tells me that he thought it was related to his testicles.  Has been treated for epididymitis and had some improvement from that standpoint but felt like the pain in his hip is worsened.  Has trouble laying on the left side.  He fell a couple weeks ago and landed on that side.  Typically walks with a cane.  Has been taking narcotics for his testicular pain.  Try to cut back yesterday and feels like his hip pain worsened.  Denies loss of bowel or bladder denies also.  Sensation denies numbness or weakness of the leg.  The history is provided by the patient.  Fall This is a new problem. The current episode started more than 1 week ago. The problem occurs rarely. The problem has been resolved. Pertinent negatives include no chest pain, no abdominal pain, no headaches and no shortness of breath. The symptoms are aggravated by bending, twisting and walking. Nothing relieves the symptoms. He has tried nothing for the symptoms. The treatment provided no relief.       Past Medical History:  Diagnosis Date  . Arthritis   . CAD (coronary artery disease)  cardiac cath with moderate disease in a septal branch of the ramus intermedius 04/01/2012  . Depression   . Diabetes mellitus    poorly controlled by his report  . History of narcotic addiction ([place])    past history of back pain  . Hypercholesteremia   . Hypertension   . IBS (irritable bowel syndrome)   . 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[place])   . Neuropathy   . Obesity    Max weight was 390  . OSA on CPAP   . Panic attacks   . Testosterone deficiency   . Vertigo     Patient Active Problem List   Diagnosis Date Noted  .  Balance problem 03/11/2020  . Tinea cruris 03/11/2020  . Cellulitis of left groin 03/11/2020  . Acute pain of right shoulder 03/11/2020  . Interstitial lung disease ([place]) 05/02/2017  . Pansinusitis 09/26/2016  . Diabetic polyneuropathy associated with diabetes mellitus due to underlying condition ([place]) 05/08/2016  . Methamphetamine use disorder, severe, dependence ([place]) 02/11/2016  . Substance induced mood disorder ([place]) 02/11/2016  . Exertional dyspnea 11/30/2015  . ADD (attention deficit disorder) 09/29/2015  . Binge eating 09/29/2015  . Syncope 05/05/2012  . Diarrhea 05/05/2012  . Panic attacks 05/05/2012  . OSA (obstructive sleep apnea) 05/05/2012  . HTN (hypertension) 05/05/2012  . Diabetes mellitus, type II ([place]) 05/05/2012  . Dyslipidemia 05/05/2012  . CAD (coronary artery disease)  cardiac cath with moderate disease in a septal branch of the ramus intermedius 04/01/2012  . Chest pain 04/01/2012  . Drug abuse and dependence ([place]) 04/01/2012  . Family history of coronary artery disease 03/31/2012  . Sleep apnea, on C-pap 03/31/2012  . Hyperlipemia 03/30/2012  . HTN (hypertension), benign 03/30/2012  . Morbid obesity ([place]) 03/30/2012  . DM type 2, uncontrolled, with neuropathy ([place]) 03/30/2012  . Depression with anxiety 03/30/2012    Past Surgical History:  Procedure Laterality Date  . CARDIAC CATHETERIZATION    . LEFT  HEART CATHETERIZATION WITH CORONARY ANGIOGRAM N/A 03/31/2012   Procedure: LEFT HEART CATHETERIZATION WITH CORONARY ANGIOGRAM;  Surgeon: Leonie Man, MD;  Location: [facility] CATH LAB;  Service: Cardiovascular;  Laterality: N/A;       Family History  Problem Relation Age of Onset  . Diabetes type II Father   . Hypertension Father   . Pancreatic disease Father 46       Deceased  . Healthy Mother   . Healthy Sister   . Healthy Son   . Healthy Daughter     Social History   Tobacco Use  . Smoking status: Current Every Day Smoker    Packs/day: 0.50     Types: Cigarettes  . Smokeless tobacco: Never Used  Vaping Use  . Vaping Use: Never used  Substance Use Topics  . Alcohol use: No    Alcohol/week: 0.0 standard drinks  . Drug use: Yes    Types: Methamphetamines    Home Medications Prior to Admission medications   Medication Sig Start Date End Date Taking? Authorizing Provider  acetaminophen (TYLENOL) 325 MG tablet Take 650 mg by mouth every 6 (six) hours as needed for mild pain, fever or headache.    [provider]  ciprofloxacin (CIPRO) 500 MG tablet Take 1 tablet (500 mg total) by mouth 2 (two) times daily. 06/06/20   Milton Ferguson, MD  gabapentin (NEURONTIN) 300 MG capsule Take 2 tablets in the morning, 1 tablet in the afternoon, and 1 tablet at bedtime. Patient taking differently: Take 300-600 mg by mouth See admin instructions. 600mg  in the morning 300mg  in the afternoon 300mg  at bedtime 04/29/20   Narda Amber K, DO  HYDROcodone-acetaminophen (NORCO) 5-325 MG tablet Take 1 tablet by mouth every 6 (six) hours as needed for severe pain. 06/20/20   Hayden Rasmussen, MD  Insulin Lispro Prot & Lispro (HUMALOG MIX 75/25 KWIKPEN) (75-25) 100 UNIT/ML Kwikpen Inject 10 Units into the skin 2 (two) times daily. Patient not taking: Reported on 06/06/2020 05/02/17   Carollee Herter, Alferd Apa, DO  insulin NPH-regular Human (70-30) 100 UNIT/ML injection Inject 60 Units into the skin 3 (three) times daily.    [provider]  levofloxacin (LEVAQUIN) 500 MG tablet Take 1 tablet daily starting Friday, 04/15/2020. Patient not taking: Reported on 06/06/2020 04/14/20   Molpus, John, MD  lidocaine (LIDODERM) 5 % Place 1 patch onto the skin daily. Remove & Discard patch within 12 hours or as directed by MD Patient not taking: Reported on 06/06/2020 05/31/20   Palumbo, April, MD  losartan (COZAAR) 50 MG tablet Take 1 tablet (50 mg total) by mouth daily. 03/10/20   [name], DO  naproxen sodium (ALEVE) 220 MG tablet Take 440-660  mg by mouth 2 (two) times daily as needed (pain/headache).    [provider]  nystatin (NYSTATIN) powder Apply 1 application topically 3 (three) times daily. Patient not taking: Reported on 04/29/2020 03/10/20   Carollee Herter, Alferd Apa, DO  pravastatin (PRAVACHOL) 40 MG tablet Take 1 tablet (40 mg total) by mouth daily. 03/10/20   [name], DO  sulfamethoxazole-trimethoprim (BACTRIM DS) 800-160 MG tablet Take 1 tablet by mouth 2 (two) times daily for 10 days. 06/16/20 06/26/20  Marcello Fennel, PA-C  tamsulosin (FLOMAX) 0.4 MG CAPS capsule Take 1 capsule (0.4 mg total) by mouth daily. 03/10/20   [name], DO    Allergies    Gabapentin  Review of Systems   Review of Systems  Constitutional: Negative for chills and fever.  HENT: Negative for congestion and facial swelling.   Eyes: Negative for discharge and visual disturbance.  Respiratory: Negative for shortness of breath.   Cardiovascular: Negative for chest pain and palpitations.  Gastrointestinal: Negative for abdominal pain, diarrhea and vomiting.  Musculoskeletal: Positive for arthralgias, back pain and myalgias.  Skin: Negative for color change and rash.  Neurological: Negative for tremors, syncope and headaches.  Psychiatric/Behavioral: Negative for confusion and dysphoric mood.    Physical Exam Updated Vital Signs BP (!) 197/103   Pulse 88   Temp 97.7 F (36.5 C)   Resp 18   Ht 5\' 11"  (1.803 m)   Wt 131.5 kg   SpO2 98%   BMI 40.45 kg/m   Physical Exam Vitals and nursing note reviewed.  Constitutional:      Appearance: He is well-developed.  HENT:     Head: Normocephalic and atraumatic.  Eyes:     Pupils: Pupils are equal, round, and reactive to light.  Neck:     Vascular: No JVD.  Cardiovascular:     Rate and Rhythm: Normal rate and regular rhythm.     Heart sounds: No murmur heard.  No friction rub. No gallop.   Pulmonary:     Effort: No respiratory distress.     Breath  sounds: No wheezing.  Abdominal:     General: There is no distension.     Tenderness: There is no guarding or rebound.  Musculoskeletal:        General: Normal range of motion.     Cervical back: Normal range of motion and neck supple.     Comments: Pulse motor and sensation are intact to the left lower extremity.  There is no significant pain with internal or external rotation of the hip.  Mild pain along the greater trochanter.  Mild pain along the midline L-spine.  Skin:    Coloration: Skin is not pale.     Findings: No rash.  Neurological:     Mental Status: He is alert and oriented to person, place, and time.  Psychiatric:        Behavior: Behavior normal.     ED Results / Procedures / Treatments   Labs (all labs ordered are listed, but only abnormal results are displayed) Labs Reviewed - No data to display  EKG None  Radiology DG Lumbar Spine Complete  Result Date: 06/23/2020 CLINICAL DATA:  Fall 3 weeks ago with subsequent pain radiating down the left leg EXAM: LUMBAR SPINE - COMPLETE 4+ VIEW COMPARISON:  Abdomen and pelvis CT 03/25/2019 FINDINGS: No fracture or traumatic malalignment. No focal disc space narrowing. Sclerosis at the left L5-S1 facet is stable from a 2020 abdominal CT and attributed to facet osteoarthritis. Vague rounded sclerotic focus in the L3 body, also stable from 2020. IMPRESSION: No acute finding or change from CT last year. Electronically Signed   By: Monte Fantasia M.D.   On: 06/23/2020 06:16   DG Hip Unilat W or Wo Pelvis 2-3 Views Left  Result Date: 06/23/2020 CLINICAL DATA:  Left hip pain. EXAM: DG HIP (WITH OR WITHOUT PELVIS) 2-3V LEFT COMPARISON:  11/04/2019. FINDINGS: Mild degenerative changes both hips. No acute bony or joint abnormality. No evidence of fracture dislocation. Pelvic calcifications consistent phleboliths. Mild peripheral vascular calcification. IMPRESSION: Mild degenerative changes both hips.  No acute abnormality. Electronically  Signed   By: Marcello Moores  Register   On: 06/23/2020 06:15    Procedures Procedures (including critical care  time)  Medications Ordered in ED Medications  acetaminophen (TYLENOL) tablet 1,000 mg (1,000 mg Oral Given 06/23/20 0616)  ketorolac (TORADOL) injection 15 mg (15 mg Intramuscular Given 06/23/20 0617)  oxyCODONE (Oxy IR/ROXICODONE) immediate release tablet 5 mg (5 mg Oral Given 06/23/20 0617)  diazepam (VALIUM) tablet 5 mg (5 mg Oral Given 06/23/20 7282)    ED Course  I have reviewed the triage vital signs and the nursing notes.  Pertinent labs & imaging results that were available during my care of the patient were reviewed by me and considered in my medical decision making (see chart for details).    MDM Rules/Calculators/A&P                          59 yo M with a chief complaints of left hip pain.  This started after a fall a few weeks ago.  He has been able to bear weight and walk on it.  I discussed with him the limited utility of imaging of the hip if the patient is able to bear weight.  Felt to be unlikely fractured.  Patient electing for imaging.  Plain films viewed by me without fracture.  Treated pain here.  No concerning signs or symptoms for cauda equina.  PCP follow-up.  6:59 AM:  I have discussed the diagnosis/risks/treatment options with the patient and believe the pt to be eligible for discharge home to follow-up with PCP. We also discussed returning to the ED immediately if new or worsening sx occur. We discussed the sx which are most concerning (e.g., sudden worsening pain, fever, inability to tolerate by mouth) that necessitate immediate return. Medications administered to the patient during their visit and any new prescriptions provided to the patient are listed below.  Medications given during this visit Medications  acetaminophen (TYLENOL) tablet 1,000 mg (1,000 mg Oral Given 06/23/20 0616)  ketorolac (TORADOL) injection 15 mg (15 mg Intramuscular Given 06/23/20 0617)    oxyCODONE (Oxy IR/ROXICODONE) immediate release tablet 5 mg (5 mg Oral Given 06/23/20 0617)  diazepam (VALIUM) tablet 5 mg (5 mg Oral Given 06/23/20 0601)     The patient appears reasonably screen and/or stabilized for discharge and I doubt any other medical condition or other [facility] requiring further screening, evaluation, or treatment in the ED at this time prior to discharge.   Final Clinical Impression(s) / ED Diagnoses Final diagnoses:  Left hip pain    Rx / DC Orders ED Discharge Orders    None       Deno Etienne, DO 06/23/20 [phone]

## 2020-06-23 NOTE — ED Triage Notes (Signed)
Patient states he tripped over a cat and landed on his right side. Patient is complaining of right hip pain.

## 2020-06-23 NOTE — Discharge Instructions (Signed)
Your back pain is most likely due to a muscular strain.  There is been a lot of research on back pain, unfortunately the only thing that seems to really help is Tylenol and ibuprofen.  Relative rest is also important to not lift greater than 10 pounds bending or twisting at the waist.  Please follow-up with your family physician.  The other thing that really seems to benefit patients is physical therapy which your doctor may send you for.  Please return to the emergency department for new numbness or weakness to your arms or legs. Difficulty with urinating or urinating or pooping on yourself.  Also if you cannot feel toilet paper when you wipe or get a fever.   Take 4 over the counter ibuprofen tablets 3 times a day or 2 over-the-counter naproxen tablets twice a day for pain. Also take tylenol 1000mg(2 extra strength) four times a day.   

## 2020-06-24 ENCOUNTER — Encounter: Payer: Medicare HMO | Admitting: Thoracic Surgery (Cardiothoracic Vascular Surgery)

## 2020-06-25 DIAGNOSIS — N5082 Scrotal pain: Secondary | ICD-10-CM | POA: Diagnosis not present

## 2020-06-25 DIAGNOSIS — R0789 Other chest pain: Secondary | ICD-10-CM | POA: Diagnosis not present

## 2020-06-25 DIAGNOSIS — R0602 Shortness of breath: Secondary | ICD-10-CM | POA: Diagnosis not present

## 2020-06-25 DIAGNOSIS — N200 Calculus of kidney: Secondary | ICD-10-CM | POA: Diagnosis not present

## 2020-06-25 DIAGNOSIS — R079 Chest pain, unspecified: Secondary | ICD-10-CM | POA: Diagnosis not present

## 2020-06-25 DIAGNOSIS — N433 Hydrocele, unspecified: Secondary | ICD-10-CM | POA: Diagnosis not present

## 2020-06-25 DIAGNOSIS — R0689 Other abnormalities of breathing: Secondary | ICD-10-CM | POA: Diagnosis not present

## 2020-06-25 DIAGNOSIS — R1084 Generalized abdominal pain: Secondary | ICD-10-CM | POA: Diagnosis not present

## 2020-06-25 DIAGNOSIS — K429 Umbilical hernia without obstruction or gangrene: Secondary | ICD-10-CM | POA: Diagnosis not present

## 2020-06-26 DIAGNOSIS — I248 Other forms of acute ischemic heart disease: Secondary | ICD-10-CM | POA: Diagnosis not present

## 2020-06-26 DIAGNOSIS — Z6841 Body Mass Index (BMI) 40.0 and over, adult: Secondary | ICD-10-CM | POA: Diagnosis not present

## 2020-06-26 DIAGNOSIS — Z79899 Other long term (current) drug therapy: Secondary | ICD-10-CM | POA: Diagnosis not present

## 2020-06-26 DIAGNOSIS — Z20822 Contact with and (suspected) exposure to covid-19: Secondary | ICD-10-CM | POA: Diagnosis not present

## 2020-06-26 DIAGNOSIS — N433 Hydrocele, unspecified: Secondary | ICD-10-CM | POA: Diagnosis not present

## 2020-06-26 DIAGNOSIS — R0602 Shortness of breath: Secondary | ICD-10-CM | POA: Diagnosis not present

## 2020-06-26 DIAGNOSIS — N200 Calculus of kidney: Secondary | ICD-10-CM | POA: Diagnosis not present

## 2020-06-26 DIAGNOSIS — N3000 Acute cystitis without hematuria: Secondary | ICD-10-CM | POA: Diagnosis not present

## 2020-06-26 DIAGNOSIS — E1110 Type 2 diabetes mellitus with ketoacidosis without coma: Secondary | ICD-10-CM | POA: Diagnosis not present

## 2020-06-26 DIAGNOSIS — N5082 Scrotal pain: Secondary | ICD-10-CM | POA: Diagnosis not present

## 2020-06-26 DIAGNOSIS — E871 Hypo-osmolality and hyponatremia: Secondary | ICD-10-CM | POA: Diagnosis not present

## 2020-06-26 DIAGNOSIS — I160 Hypertensive urgency: Secondary | ICD-10-CM | POA: Diagnosis not present

## 2020-06-26 DIAGNOSIS — K429 Umbilical hernia without obstruction or gangrene: Secondary | ICD-10-CM | POA: Diagnosis not present

## 2020-06-26 DIAGNOSIS — I10 Essential (primary) hypertension: Secondary | ICD-10-CM | POA: Diagnosis not present

## 2020-06-26 DIAGNOSIS — R000 Tachycardia, unspecified: Secondary | ICD-10-CM | POA: Diagnosis not present

## 2020-07-01 DIAGNOSIS — N50819 Testicular pain, unspecified: Secondary | ICD-10-CM | POA: Diagnosis not present

## 2020-07-01 DIAGNOSIS — Z5321 Procedure and treatment not carried out due to patient leaving prior to being seen by health care provider: Secondary | ICD-10-CM | POA: Diagnosis not present

## 2020-07-03 DIAGNOSIS — R0602 Shortness of breath: Secondary | ICD-10-CM | POA: Diagnosis not present

## 2020-07-03 DIAGNOSIS — M25552 Pain in left hip: Secondary | ICD-10-CM | POA: Diagnosis not present

## 2020-07-03 DIAGNOSIS — E1165 Type 2 diabetes mellitus with hyperglycemia: Secondary | ICD-10-CM | POA: Diagnosis not present

## 2020-07-03 DIAGNOSIS — M160 Bilateral primary osteoarthritis of hip: Secondary | ICD-10-CM | POA: Diagnosis not present

## 2020-07-03 DIAGNOSIS — I10 Essential (primary) hypertension: Secondary | ICD-10-CM | POA: Diagnosis not present

## 2020-07-03 DIAGNOSIS — Y999 Unspecified external cause status: Secondary | ICD-10-CM | POA: Diagnosis not present

## 2020-07-03 DIAGNOSIS — W1839XA Other fall on same level, initial encounter: Secondary | ICD-10-CM | POA: Diagnosis not present

## 2020-07-03 DIAGNOSIS — R52 Pain, unspecified: Secondary | ICD-10-CM | POA: Diagnosis not present

## 2020-07-03 DIAGNOSIS — R069 Unspecified abnormalities of breathing: Secondary | ICD-10-CM | POA: Diagnosis not present

## 2020-07-05 ENCOUNTER — Other Ambulatory Visit: Payer: Self-pay

## 2020-07-05 ENCOUNTER — Encounter: Payer: Self-pay | Admitting: Family Medicine

## 2020-07-05 ENCOUNTER — Ambulatory Visit (INDEPENDENT_AMBULATORY_CARE_PROVIDER_SITE_OTHER): Payer: Medicare HMO | Admitting: Family Medicine

## 2020-07-05 ENCOUNTER — Ambulatory Visit (HOSPITAL_BASED_OUTPATIENT_CLINIC_OR_DEPARTMENT_OTHER)
Admission: RE | Admit: 2020-07-05 | Discharge: 2020-07-05 | Disposition: A | Discharge status: Home / Self Care | Payer: Medicare HMO | Source: Ambulatory Visit | Attending: Family Medicine | Admitting: Family Medicine

## 2020-07-05 VITALS — BP 160/110 | HR 99 | Temp 98.0°F | Resp 18 | Ht 71.0 in | Wt 291.8 lb

## 2020-07-05 DIAGNOSIS — E1165 Type 2 diabetes mellitus with hyperglycemia: Secondary | ICD-10-CM

## 2020-07-05 DIAGNOSIS — R251 Tremor, unspecified: Secondary | ICD-10-CM | POA: Diagnosis not present

## 2020-07-05 DIAGNOSIS — R531 Weakness: Secondary | ICD-10-CM | POA: Insufficient documentation

## 2020-07-05 DIAGNOSIS — E1140 Type 2 diabetes mellitus with diabetic neuropathy, unspecified: Secondary | ICD-10-CM

## 2020-07-05 DIAGNOSIS — M25552 Pain in left hip: Secondary | ICD-10-CM | POA: Insufficient documentation

## 2020-07-05 DIAGNOSIS — IMO0002 Reserved for concepts with insufficient information to code with codable children: Secondary | ICD-10-CM

## 2020-07-05 DIAGNOSIS — E785 Hyperlipidemia, unspecified: Secondary | ICD-10-CM

## 2020-07-05 DIAGNOSIS — M545 Low back pain, unspecified: Secondary | ICD-10-CM | POA: Diagnosis not present

## 2020-07-05 DIAGNOSIS — I10 Essential (primary) hypertension: Secondary | ICD-10-CM | POA: Diagnosis not present

## 2020-07-05 LAB — LIPID PANEL
Cholesterol: 158 mg/dL (ref 0–200)
HDL: 50 mg/dL (ref >39.00)
LDL Cholesterol: 83 mg/dL (ref 0–99)
NonHDL: 107.86
Total CHOL/HDL Ratio: 3
Triglycerides: 123 mg/dL (ref 0.0–149.0)
VLDL: 24.6 mg/dL (ref 0.0–40.0)

## 2020-07-05 LAB — CBC WITH DIFFERENTIAL/PLATELET
Basophils Absolute: 0.1 10*3/uL (ref 0.0–0.1)
Basophils Relative: 0.8 % (ref 0.0–3.0)
Eosinophils Absolute: 0.1 10*3/uL (ref 0.0–0.7)
Eosinophils Relative: 1.6 % (ref 0.0–5.0)
HCT: 46.9 % (ref 39.0–52.0)
Hemoglobin: 15.8 g/dL (ref 13.0–17.0)
Lymphocytes Relative: 23.2 % (ref 12.0–46.0)
Lymphs Abs: 1.8 10*3/uL (ref 0.7–4.0)
MCHC: 33.8 g/dL (ref 30.0–36.0)
MCV: 87 fl (ref 78.0–100.0)
Monocytes Absolute: 0.8 10*3/uL (ref 0.1–1.0)
Monocytes Relative: 10.5 % (ref 3.0–12.0)
Neutro Abs: 5.1 10*3/uL (ref 1.4–7.7)
Neutrophils Relative %: 63.9 % (ref 43.0–77.0)
Platelets: 318 10*3/uL (ref 150.0–400.0)
RBC: 5.39 Mil/uL (ref 4.22–5.81)
RDW: 13 % (ref 11.5–15.5)
WBC: 7.9 10*3/uL (ref 4.0–10.5)

## 2020-07-05 LAB — COMPREHENSIVE METABOLIC PANEL
ALT: 22 U/L (ref 0–53)
AST: 9 U/L (ref 0–37)
Albumin: 4.2 g/dL (ref 3.5–5.2)
Alkaline Phosphatase: 100 U/L (ref 39–117)
BUN: 26 mg/dL — ABNORMAL HIGH (ref 6–23)
CO2: 25 mEq/L (ref 19–32)
Calcium: 9.5 mg/dL (ref 8.4–10.5)
Chloride: 92 mEq/L — ABNORMAL LOW (ref 96–112)
Creatinine, Ser: 1.48 mg/dL (ref 0.40–1.50)
GFR: 52.33 mL/min — ABNORMAL LOW (ref >60.00)
Glucose, Bld: 448 mg/dL — ABNORMAL HIGH (ref 70–99)
Potassium: 4.2 mEq/L (ref 3.5–5.1)
Sodium: 128 mEq/L — ABNORMAL LOW (ref 135–145)
Total Bilirubin: 0.5 mg/dL (ref 0.2–1.2)
Total Protein: 7 g/dL (ref 6.0–8.3)

## 2020-07-05 LAB — VITAMIN B12: Vitamin B-12: 461 pg/mL (ref 211–911)

## 2020-07-05 LAB — HEMOGLOBIN A1C: Hgb A1c MFr Bld: 11.5 % — ABNORMAL HIGH (ref 4.6–6.5)

## 2020-07-05 IMAGING — DX DG LUMBAR SPINE COMPLETE 4+V
5 series · 5 of 5 positions shown · non-contrast
Comparison: Lumbar radiographs 12 days ago [DATE].

CLINICAL DATA: Left hip pain. Chronic lumbosacral back pain,
progressive. Left leg pain.

EXAM:
LUMBAR SPINE - COMPLETE 4+ VIEW

[l-spine ap]
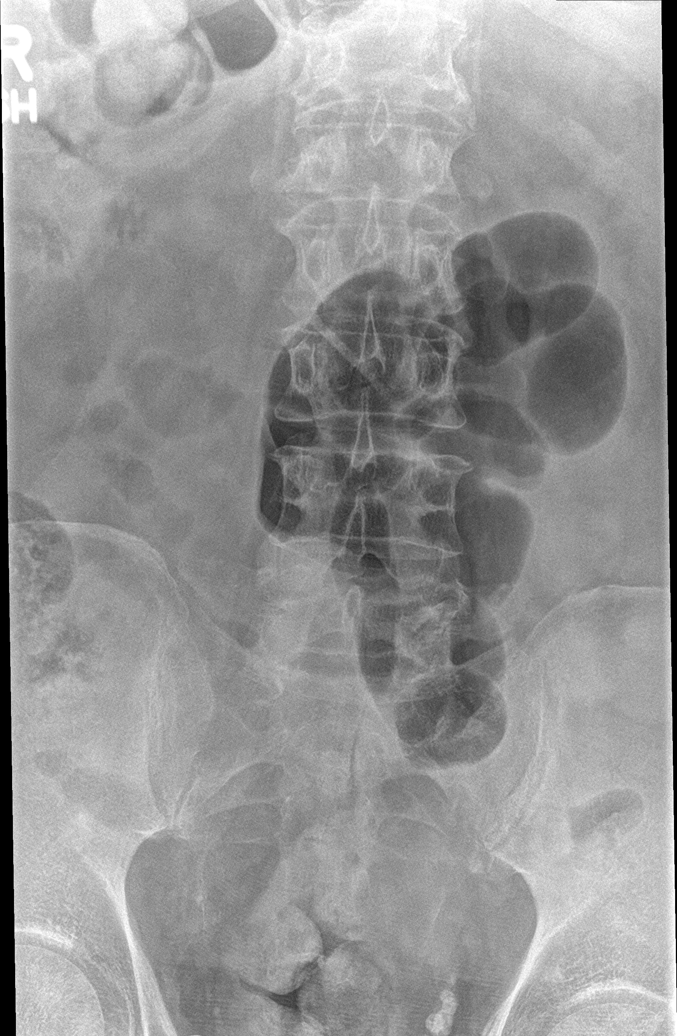

[l-spine obl (1 of 2)]
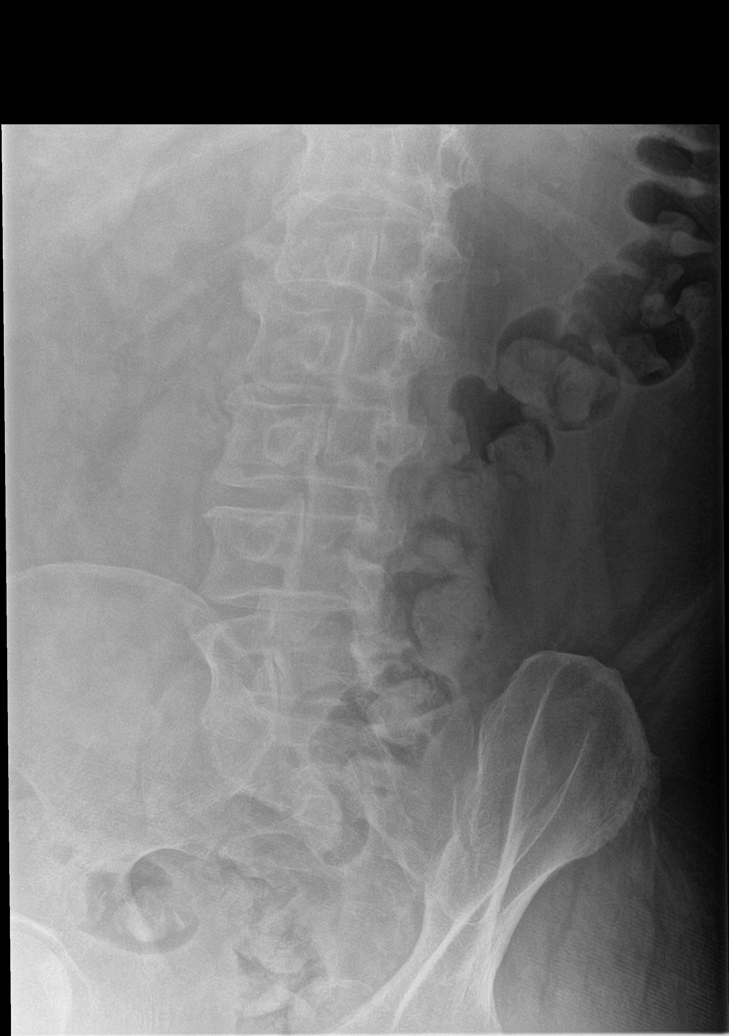

[l-spine obl (2 of 2)]
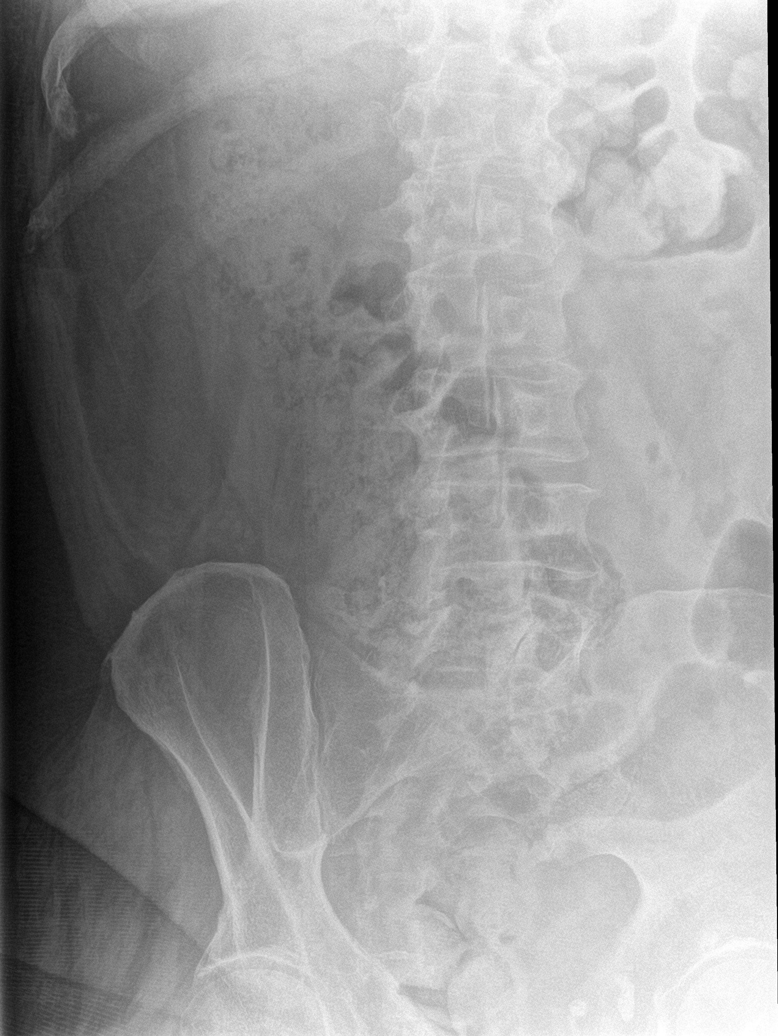

[l-spine lat]
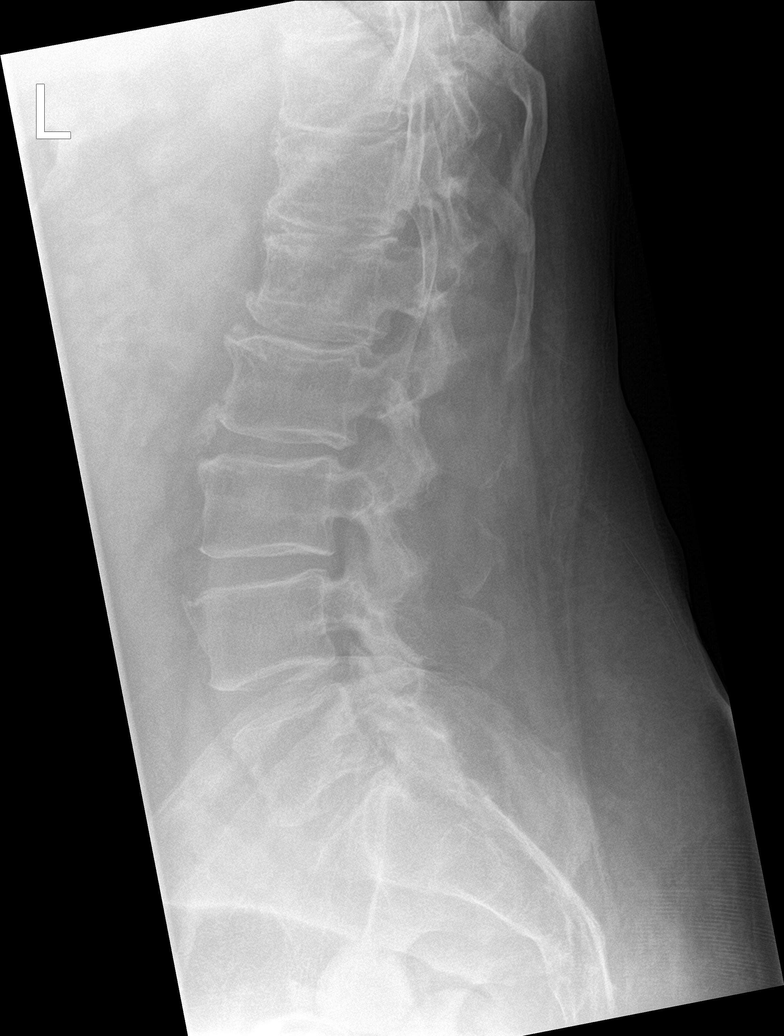

[l-spine spot]
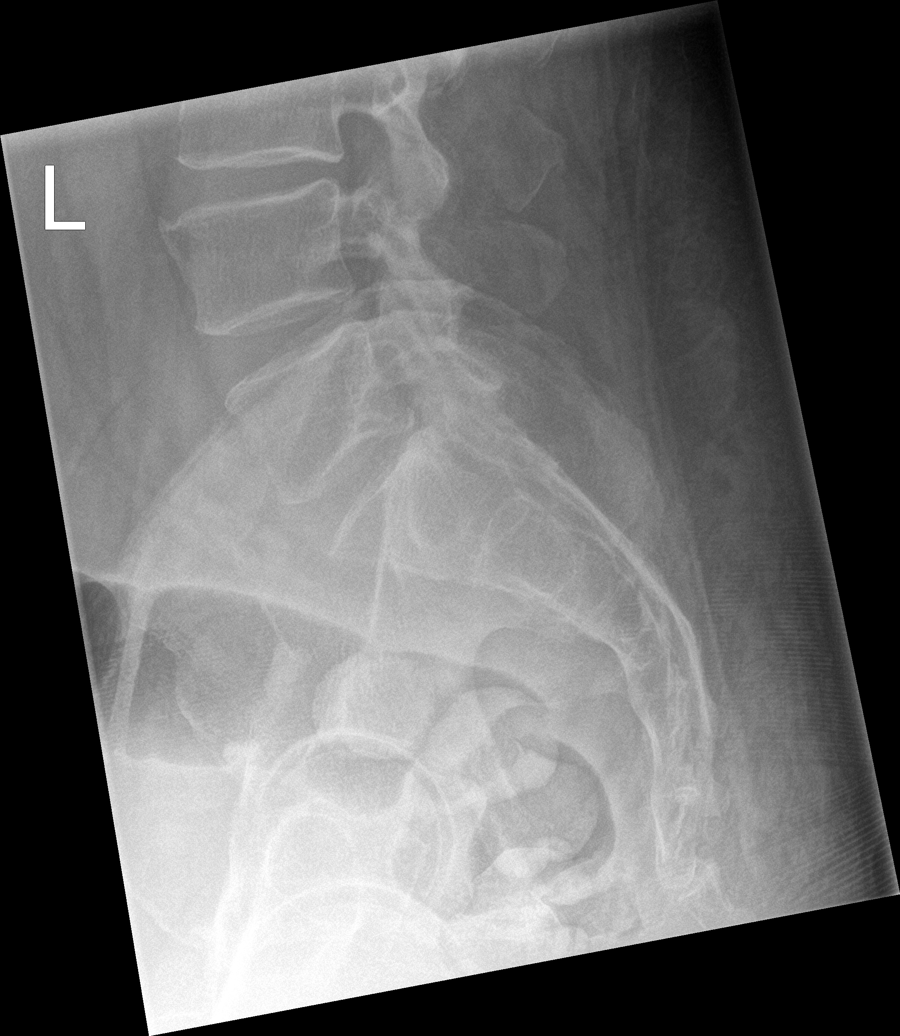

[5 of 5 positions shown; findings below may reference images not displayed]

FINDINGS: The alignment is maintained. Vertebral body heights are normal.
Endplate spurring at L1-L2 is unchanged from prior exam, additional
mild endplate spurring throughout. No fracture. Stable lower lumbar
facet hypertrophy. The vague L3 sclerotic focus is grossly
unchanged. Sacroiliac joints are congruent.
IMPRESSION: 1. No acute abnormality.
2. Stable spondylosis from prior exam.

## 2020-07-05 MED ORDER — TRESIBA FLEXTOUCH 100 UNIT/ML ~~LOC~~ SOPN: 10.0000 [IU] | PEN_INJECTOR | Freq: Every day | SUBCUTANEOUS | 5 refills | Status: DC

## 2020-07-05 MED ORDER — LOSARTAN POTASSIUM 100 MG PO TABS: 100.0000 mg | ORAL_TABLET | Freq: Every day | ORAL | 3 refills | Status: DC

## 2020-07-05 NOTE — Assessment & Plan Note (Signed)
?   From low back Check xray Refer to ortho

## 2020-07-05 NOTE — Assessment & Plan Note (Signed)
Encouraged heart healthy diet, increase exercise, avoid trans fats, consider a krill oil cap daily 

## 2020-07-05 NOTE — Assessment & Plan Note (Signed)
Pt concerned about parkinson disease F/u neuro

## 2020-07-05 NOTE — Assessment & Plan Note (Signed)
Poorly controlled will alter medications, encouraged DASH diet, minimize caffeine and obtain adequate sleep. Report concerning symptoms and follow up as directed and as needed 

## 2020-07-05 NOTE — Assessment & Plan Note (Signed)
?   From low back Check xray and refer to ortho

## 2020-07-05 NOTE — Patient Instructions (Signed)
Hip Pain The hip is the joint between the upper legs and the lower pelvis. The bones, cartilage, tendons, and muscles of your hip joint support your body and allow you to move around. Hip pain can range from a minor ache to severe pain in one or both of your hips. The pain may be felt on the inside of the hip joint near the groin, or on the outside near the buttocks and upper thigh. You may also have swelling or stiffness in your hip area. Follow these instructions at home: Managing pain, stiffness, and swelling      If directed, put ice on the painful area. To do this: ? Put ice in a plastic bag. ? Place a towel between your skin and the bag. ? Leave the ice on for 20 minutes, 2-3 times a day.  If directed, apply heat to the affected area as often as told by your health care provider. Use the heat source that your health care provider recommends, such as a moist heat pack or a heating pad. ? Place a towel between your skin and the heat source. ? Leave the heat on for 20-30 minutes. ? Remove the heat if your skin turns bright red. This is especially important if you are unable to feel pain, heat, or cold. You may have a greater risk of getting burned. Activity  Do exercises as told by your health care provider.  Avoid activities that cause pain. General instructions   Take over-the-counter and prescription medicines only as told by your health care provider.  Keep a journal of your symptoms. Write down: ? How often you have hip pain. ? The location of your pain. ? What the pain feels like. ? What makes the pain worse.  Sleep with a pillow between your legs on your most comfortable side.  Keep all follow-up visits as told by your health care provider. This is important. Contact a health care provider if:  You cannot put weight on your leg.  Your pain or swelling continues or gets worse after one week.  It gets harder to walk.  You have a fever. Get help right away  if:  You fall.  You have a sudden increase in pain and swelling in your hip.  Your hip is red or swollen or very tender to touch. Summary  Hip pain can range from a minor ache to severe pain in one or both of your hips.  The pain may be felt on the inside of the hip joint near the groin, or on the outside near the buttocks and upper thigh.  Avoid activities that cause pain.  Write down how often you have hip pain, the location of the pain, what makes it worse, and what it feels like. This information is not intended to replace advice given to you by your health care provider. Make sure you discuss any questions you have with your health care provider. Document Revised: 12/22/2018 Document Reviewed: 12/22/2018 Elsevier Patient Education  2020 [employer] -- 

## 2020-07-05 NOTE — Assessment & Plan Note (Signed)
hgba1c-- unacceptable minimize simple carbs. Increase exercise as tolerated. Start tresiba 10 u and f/u endo
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
## 2020-07-07 ENCOUNTER — Emergency Department (HOSPITAL_COMMUNITY)
Admission: EM | Admit: 2020-07-07 | Discharge: 2020-07-08 | Disposition: A | Discharge status: Nursing Home (Includes ICF) | Payer: Medicare HMO | Attending: Emergency Medicine | Admitting: Emergency Medicine

## 2020-07-07 ENCOUNTER — Telehealth: Payer: Self-pay

## 2020-07-07 ENCOUNTER — Other Ambulatory Visit: Payer: Self-pay

## 2020-07-07 ENCOUNTER — Encounter (HOSPITAL_COMMUNITY): Payer: Self-pay

## 2020-07-07 ENCOUNTER — Ambulatory Visit: Payer: Medicare HMO | Admitting: Orthopaedic Surgery

## 2020-07-07 DIAGNOSIS — Z794 Long term (current) use of insulin: Secondary | ICD-10-CM | POA: Diagnosis not present

## 2020-07-07 DIAGNOSIS — I2510 Atherosclerotic heart disease of native coronary artery without angina pectoris: Secondary | ICD-10-CM | POA: Insufficient documentation

## 2020-07-07 DIAGNOSIS — Z79899 Other long term (current) drug therapy: Secondary | ICD-10-CM | POA: Diagnosis not present

## 2020-07-07 DIAGNOSIS — R4182 Altered mental status, unspecified: Secondary | ICD-10-CM | POA: Diagnosis not present

## 2020-07-07 DIAGNOSIS — E1140 Type 2 diabetes mellitus with diabetic neuropathy, unspecified: Secondary | ICD-10-CM | POA: Insufficient documentation

## 2020-07-07 DIAGNOSIS — F329 Major depressive disorder, single episode, unspecified: Secondary | ICD-10-CM | POA: Insufficient documentation

## 2020-07-07 DIAGNOSIS — R45851 Suicidal ideations: Secondary | ICD-10-CM | POA: Diagnosis not present

## 2020-07-07 DIAGNOSIS — R69 Illness, unspecified: Secondary | ICD-10-CM | POA: Diagnosis not present

## 2020-07-07 DIAGNOSIS — I10 Essential (primary) hypertension: Secondary | ICD-10-CM | POA: Insufficient documentation

## 2020-07-07 DIAGNOSIS — Z20822 Contact with and (suspected) exposure to covid-19: Secondary | ICD-10-CM | POA: Insufficient documentation

## 2020-07-07 DIAGNOSIS — F1994 Other psychoactive substance use, unspecified with psychoactive substance-induced mood disorder: Secondary | ICD-10-CM | POA: Diagnosis present

## 2020-07-07 DIAGNOSIS — F1520 Other stimulant dependence, uncomplicated: Secondary | ICD-10-CM | POA: Diagnosis present

## 2020-07-07 DIAGNOSIS — F1924 Other psychoactive substance dependence with psychoactive substance-induced mood disorder: Secondary | ICD-10-CM | POA: Diagnosis not present

## 2020-07-07 DIAGNOSIS — F17210 Nicotine dependence, cigarettes, uncomplicated: Secondary | ICD-10-CM | POA: Insufficient documentation

## 2020-07-07 LAB — COMPREHENSIVE METABOLIC PANEL
ALT: 31 U/L (ref 0–44)
AST: 20 U/L (ref 15–41)
Albumin: 4.1 g/dL (ref 3.5–5.0)
Alkaline Phosphatase: 89 U/L (ref 38–126)
Anion gap: 12 (ref 5–15)
BUN: 19 mg/dL (ref 6–20)
CO2: 22 mmol/L (ref 22–32)
Calcium: 9.4 mg/dL (ref 8.9–10.3)
Chloride: 96 mmol/L — ABNORMAL LOW (ref 98–111)
Creatinine, Ser: 1.16 mg/dL (ref 0.61–1.24)
GFR, Estimated: >60 mL/min (ref >60)
Glucose, Bld: 441 mg/dL — ABNORMAL HIGH (ref 70–99)
Potassium: 4.4 mmol/L (ref 3.5–5.1)
Sodium: 130 mmol/L — ABNORMAL LOW (ref 135–145)
Total Bilirubin: 0.6 mg/dL (ref 0.3–1.2)
Total Protein: 7.4 g/dL (ref 6.5–8.1)

## 2020-07-07 LAB — ACETAMINOPHEN LEVEL: Acetaminophen (Tylenol), Serum: <10 ug/mL — ABNORMAL LOW (ref 10–30)

## 2020-07-07 LAB — ETHANOL: Alcohol, Ethyl (B): <10 mg/dL (ref <10)

## 2020-07-07 LAB — CBC
HCT: 47.3 % (ref 39.0–52.0)
Hemoglobin: 16.4 g/dL (ref 13.0–17.0)
MCH: 29.8 pg (ref 26.0–34.0)
MCHC: 34.7 g/dL (ref 30.0–36.0)
MCV: 85.8 fL (ref 80.0–100.0)
Platelets: 328 10*3/uL (ref 150–400)
RBC: 5.51 MIL/uL (ref 4.22–5.81)
RDW: 12.4 % (ref 11.5–15.5)
WBC: 7.5 10*3/uL (ref 4.0–10.5)
nRBC: 0 % (ref 0.0–0.2)

## 2020-07-07 LAB — SALICYLATE LEVEL: Salicylate Lvl: <7 mg/dL — ABNORMAL LOW (ref 7.0–30.0)

## 2020-07-07 MED ORDER — INSULIN ASPART 100 UNIT/ML ~~LOC~~ SOLN
6.0000 [IU] | Freq: Once | SUBCUTANEOUS | Status: AC
  Administered 2020-07-07: 6 [IU] via SUBCUTANEOUS
  Filled 2020-07-07: qty 0.06

## 2020-07-07 NOTE — ED Triage Notes (Signed)
Pt sts suicidal ideation with a plan to overdose on heroin and pain medication. Pt admits access to anything he wants on the stress. Multiple home stressors: financial, meth use, and physical pain.

## 2020-07-07 NOTE — ED Provider Notes (Signed)
[facility] DEPT Provider Note   CSN: [id number] Arrival date & time: 07/07/20  2112     History Chief Complaint  Patient presents with  . Suicidal    Tyler Brewer is a 59 y.o. male with a history of CAD, diabetes mellitus, hypertension, hypercholesterolemia, polysubstance abuse, sleep apnea, and depression who presents to the emergency department with complaints of suicidal ideation recently.  Patient states he has been having a hard time, thoughts of committing suicide, plan to overdose. He has been using various drugs, none IV. Has HI about "certain people" Does not further elaborate. Denies hallucinations, fever, chest pain, dyspnea, or syncope. Denies alcohol use. Denies attempt to harm self.   HPI     Past Medical History:  Diagnosis Date  . Arthritis   . CAD (coronary artery disease)  cardiac cath with moderate disease in a septal branch of the ramus intermedius 04/01/2012  . Depression   . Diabetes mellitus    poorly controlled by his report  . History of narcotic addiction ([place])    past history of back pain  . Hypercholesteremia   . Hypertension   . IBS (irritable bowel syndrome)   . 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 Neuropathy   . Obesity    Max weight was 390  . OSA on CPAP   . Panic attacks   . Testosterone deficiency   . Vertigo     Patient Active Problem List   Diagnosis Date Noted  . Hip pain, acute, left 07/05/2020  . Tremor 07/05/2020  . Weakness 07/05/2020  . Balance problem 03/11/2020  . Tinea cruris 03/11/2020  . Cellulitis of left groin 03/11/2020  . Acute pain of right shoulder 03/11/2020  . Interstitial lung disease ([place]) 05/02/2017  . Pansinusitis 09/26/2016  . Diabetic polyneuropathy associated with diabetes mellitus due to underlying condition ([place]) 05/08/2016  . Methamphetamine use disorder, severe, dependence ([place]) 02/11/2016  . Substance induced mood disorder ([place]) 02/11/2016  . Exertional dyspnea  11/30/2015  . ADD (attention deficit disorder) 09/29/2015  . Binge eating 09/29/2015  . Syncope 05/05/2012  . Diarrhea 05/05/2012  . Panic attacks 05/05/2012  . OSA (obstructive sleep apnea) 05/05/2012  . HTN (hypertension) 05/05/2012  . Diabetes mellitus, type II ([place]) 05/05/2012  . Dyslipidemia 05/05/2012  . CAD (coronary artery disease)  cardiac cath with moderate disease in a septal branch of the ramus intermedius 04/01/2012  . Chest pain 04/01/2012  . Drug abuse and dependence ([place]) 04/01/2012  . Family history of coronary artery disease 03/31/2012  . Sleep apnea, on C-pap 03/31/2012  . Hyperlipemia 03/30/2012  . HTN (hypertension), benign 03/30/2012  . Morbid obesity ([place]) 03/30/2012  . DM type 2, uncontrolled, with neuropathy ([place]) 03/30/2012  . Depression with anxiety 03/30/2012    Past Surgical History:  Procedure Laterality Date  . CARDIAC CATHETERIZATION    . LEFT HEART CATHETERIZATION WITH CORONARY ANGIOGRAM N/A 03/31/2012   Procedure: LEFT HEART CATHETERIZATION WITH CORONARY ANGIOGRAM;  Surgeon: Leonie Man, MD;  [facility] C[facility] LAB;  Service: Cardiovascular;  Laterality: N/A;       Family History  Problem Relation Age of Onset  . Diabetes type II Father   . Hypertension Father   . Pancreatic disease Father 55       Deceased  . Healthy Mother   . Healthy Sister   . Healthy Son   . Healthy Daughter   . Parkinson's disease Maternal Grandmother     Social History   Tobacco  Use  . Smoking status: Current Every Day Smoker    Packs/day: 0.50    Types: Cigarettes  . Smokeless tobacco: Never Used  Vaping Use  . Vaping Use: Never used  Substance Use Topics  . Alcohol use: No    Alcohol/week: 0.0 standard drinks  . Drug use: Yes    Types: Methamphetamines    Home Medications Prior to Admission medications   Medication Sig Start Date End Date Taking? Authorizing Provider  acetaminophen (TYLENOL) 325 MG tablet Take 650 mg by mouth every 6 (six)  hours as needed for mild pain, fever or headache.    [provider]  gabapentin (NEURONTIN) 300 MG capsule Take 2 tablets in the morning, 1 tablet in the afternoon, and 1 tablet at bedtime. Patient taking differently: Take 300-600 mg by mouth See admin instructions. 600mg  in the morning 300mg  in the afternoon 300mg  at bedtime 04/29/20   Narda Amber K, DO  HYDROcodone-acetaminophen (NORCO) 5-325 MG tablet Take 1 tablet by mouth every 6 (six) hours as needed for severe pain. 06/20/20   Hayden Rasmussen, MD  insulin degludec (TRESIBA FLEXTOUCH) 100 UNIT/ML FlexTouch Pen Inject 10 Units into the skin daily. 07/05/20   [name], DO  losartan (COZAAR) 100 MG tablet Take 1 tablet (100 mg total) by mouth daily. 07/05/20   [name], DO  naproxen sodium (ALEVE) 220 MG tablet Take 440-660 mg by mouth 2 (two) times daily as needed (pain/headache).    [provider]  nystatin (NYSTATIN) powder Apply 1 application topically 3 (three) times daily. Patient not taking: Reported on 04/29/2020 03/10/20   Carollee Herter, Alferd Apa, DO  pravastatin (PRAVACHOL) 40 MG tablet Take 1 tablet (40 mg total) by mouth daily. 03/10/20   [name], DO  tamsulosin (FLOMAX) 0.4 MG CAPS capsule Take 1 capsule (0.4 mg total) by mouth daily. 03/10/20   [name], DO    Allergies    Gabapentin  Review of Systems   Review of Systems  Constitutional: Negative for chills and fever.  Respiratory: Negative for shortness of breath.   Cardiovascular: Negative for chest pain.  Gastrointestinal: Negative for diarrhea and vomiting.  Neurological: Negative for syncope.  Psychiatric/Behavioral: Positive for suicidal ideas. Negative for hallucinations.       Positive for HI.   All other systems reviewed and are negative.   Physical Exam Updated Vital Signs BP (!) 155/101 (BP Location: Left Arm)   Pulse 94   Temp 98.1 F (36.7 C) (Oral)   Resp 20   Ht 5\' 11"  (1.803 m)    Wt 132.4 kg   SpO2 99%   BMI 40.70 kg/m   Physical Exam Vitals and nursing note reviewed.  Constitutional:      General: He is not in acute distress.    Appearance: He is well-developed. He is not toxic-appearing.  HENT:     Head: Normocephalic and atraumatic.  Eyes:     General:        Right eye: No discharge.        Left eye: No discharge.     Conjunctiva/sclera: Conjunctivae normal.  Cardiovascular:     Rate and Rhythm: Normal rate and regular rhythm.  Pulmonary:     Effort: Pulmonary effort is normal. No respiratory distress.     Breath sounds: Normal breath sounds. No wheezing, rhonchi or rales.  Abdominal:     General: There is no distension.     Palpations:  Abdomen is soft.     Tenderness: There is no abdominal tenderness. There is no guarding or rebound.  Musculoskeletal:     Cervical back: Neck supple.  Skin:    General: Skin is warm and dry.     Findings: No rash.  Neurological:     Mental Status: He is alert.     Comments: Clear speech.   Psychiatric:        Thought Content: Thought content includes homicidal and suicidal ideation. Thought content includes suicidal plan.     ED Results / Procedures / Treatments   Labs (all labs ordered are listed, but only abnormal results are displayed) Labs Reviewed  COMPREHENSIVE METABOLIC PANEL - Abnormal; Notable for the following components:      Result Value   Sodium 130 (*)    Chloride 96 (*)    Glucose, Bld 441 (*)    All other components within normal limits  SALICYLATE LEVEL - Abnormal; Notable for the following components:   Salicylate Lvl <5.8 (*)    All other components within normal limits  ACETAMINOPHEN LEVEL - Abnormal; Notable for the following components:   Acetaminophen (Tylenol), Serum <10 (*)    All other components within normal limits  RAPID URINE DRUG SCREEN, HOSP PERFORMED - Abnormal; Notable for the following components:   Amphetamines POSITIVE (*)    All other components within normal  limits  CBG MONITORING, ED - Abnormal; Notable for the following components:   Glucose-Capillary 299 (*)    All other components within normal limits  RESP PANEL BY RT-PCR (FLU A&B, COVID) ARPGX2  ETHANOL  CBC    EKG None  Radiology No results found.  Procedures Procedures (including critical care time)  Medications Ordered in ED Medications - No data to display  ED Course  I have reviewed the triage vital signs and the nursing notes.  Pertinent labs & imaging results that were available during my care of the patient were reviewed by me and considered in my medical decision making (see chart for details).    MDM Rules/Calculators/A&P                          Patient presents to the ED for evaluation of SI.  Nontoxic, vitals w/ mildly elevated BP- doubt HTN emergency.  Screening labs with hyperglycemia without acidosis or anion gap elevation.  Given subcu insulin with improvement to 299.  Covid negative.  Additional history obtained per chart review nursing note reviewed.  Patient medically cleared at this time.  Disposition per behavioral health.  Final Clinical Impression(s) / ED Diagnoses Final diagnoses:  Suicidal ideation    Rx / DC Orders ED Discharge Orders    None       Leafy Kindle 07/08/20 [id number]    Palumbo, April, MD 07/08/20 [phone]

## 2020-07-07 NOTE — Telephone Encounter (Signed)
-----   Message from [name], DO sent at 07/07/2020  1:02 PM EST ----- Dm not controlled ---  how are sugars with tresiba?  If still >300 -- double tresiba to 20 u  Endo referral pending

## 2020-07-07 NOTE — Telephone Encounter (Signed)
Called pt informed of providers increase of trisba, pt agreed felt 10 units wasn't enough will adjust to 20 units as provider recommended.

## 2020-07-08 ENCOUNTER — Emergency Department (HOSPITAL_COMMUNITY): Payer: Medicare HMO

## 2020-07-08 DIAGNOSIS — F419 Anxiety disorder, unspecified: Secondary | ICD-10-CM | POA: Diagnosis not present

## 2020-07-08 DIAGNOSIS — R45851 Suicidal ideations: Secondary | ICD-10-CM | POA: Diagnosis not present

## 2020-07-08 DIAGNOSIS — R69 Illness, unspecified: Secondary | ICD-10-CM | POA: Diagnosis not present

## 2020-07-08 DIAGNOSIS — F329 Major depressive disorder, single episode, unspecified: Secondary | ICD-10-CM | POA: Diagnosis not present

## 2020-07-08 DIAGNOSIS — L4050 Arthropathic psoriasis, unspecified: Secondary | ICD-10-CM | POA: Diagnosis not present

## 2020-07-08 DIAGNOSIS — E1142 Type 2 diabetes mellitus with diabetic polyneuropathy: Secondary | ICD-10-CM | POA: Diagnosis not present

## 2020-07-08 DIAGNOSIS — I10 Essential (primary) hypertension: Secondary | ICD-10-CM | POA: Diagnosis not present

## 2020-07-08 DIAGNOSIS — F314 Bipolar disorder, current episode depressed, severe, without psychotic features: Secondary | ICD-10-CM | POA: Diagnosis not present

## 2020-07-08 DIAGNOSIS — Z888 Allergy status to other drugs, medicaments and biological substances status: Secondary | ICD-10-CM | POA: Diagnosis not present

## 2020-07-08 DIAGNOSIS — R4182 Altered mental status, unspecified: Secondary | ICD-10-CM | POA: Diagnosis not present

## 2020-07-08 DIAGNOSIS — Z794 Long term (current) use of insulin: Secondary | ICD-10-CM | POA: Diagnosis not present

## 2020-07-08 LAB — CBG MONITORING, ED
Glucose-Capillary: 299 mg/dL — ABNORMAL HIGH (ref 70–99)
Glucose-Capillary: 342 mg/dL — ABNORMAL HIGH (ref 70–99)
Glucose-Capillary: 380 mg/dL — ABNORMAL HIGH (ref 70–99)

## 2020-07-08 LAB — URINALYSIS, ROUTINE W REFLEX MICROSCOPIC
Bacteria, UA: NONE SEEN
Bilirubin Urine: NEGATIVE
Glucose, UA: >=500 mg/dL — AB
Hgb urine dipstick: NEGATIVE
Ketones, ur: NEGATIVE mg/dL
Leukocytes,Ua: NEGATIVE
Nitrite: NEGATIVE
Protein, ur: NEGATIVE mg/dL
Specific Gravity, Urine: 1.031 — ABNORMAL HIGH (ref 1.005–1.030)
pH: 6 (ref 5.0–8.0)

## 2020-07-08 LAB — RAPID URINE DRUG SCREEN, HOSP PERFORMED
Amphetamines: POSITIVE — AB
Barbiturates: NOT DETECTED
Benzodiazepines: NOT DETECTED
Cocaine: NOT DETECTED
Opiates: NOT DETECTED
Tetrahydrocannabinol: NOT DETECTED

## 2020-07-08 LAB — RESP PANEL BY RT-PCR (FLU A&B, COVID) ARPGX2
Influenza A by PCR: NEGATIVE
Influenza B by PCR: NEGATIVE
SARS Coronavirus 2 by RT PCR: NEGATIVE

## 2020-07-08 IMAGING — CR DG CHEST 2V
2 series · 2 of 2 positions shown · non-contrast
Comparison: [DATE]

CLINICAL DATA: Mental status change.

EXAM:
CHEST - 2 VIEW

[w chest lat]
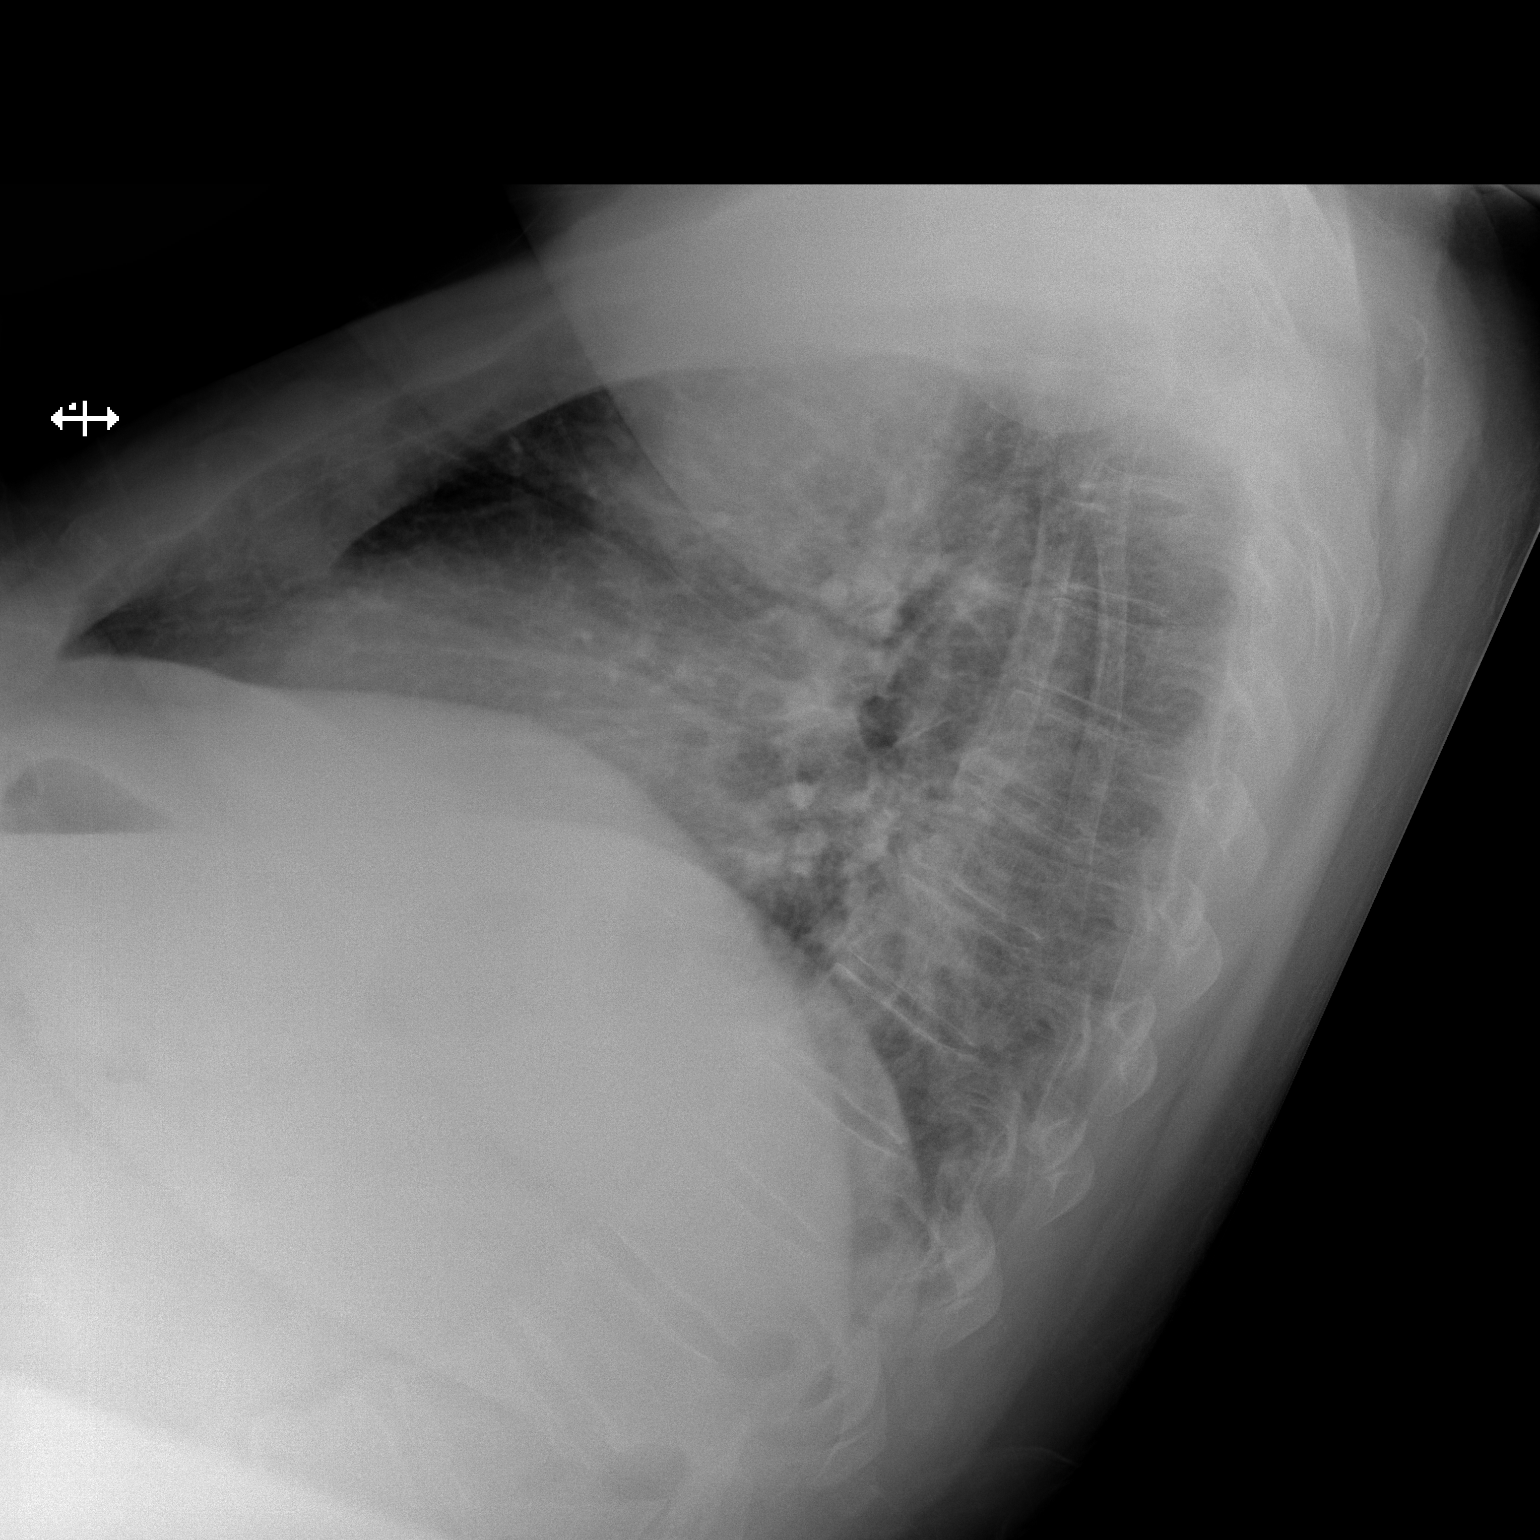

[x chest ap]
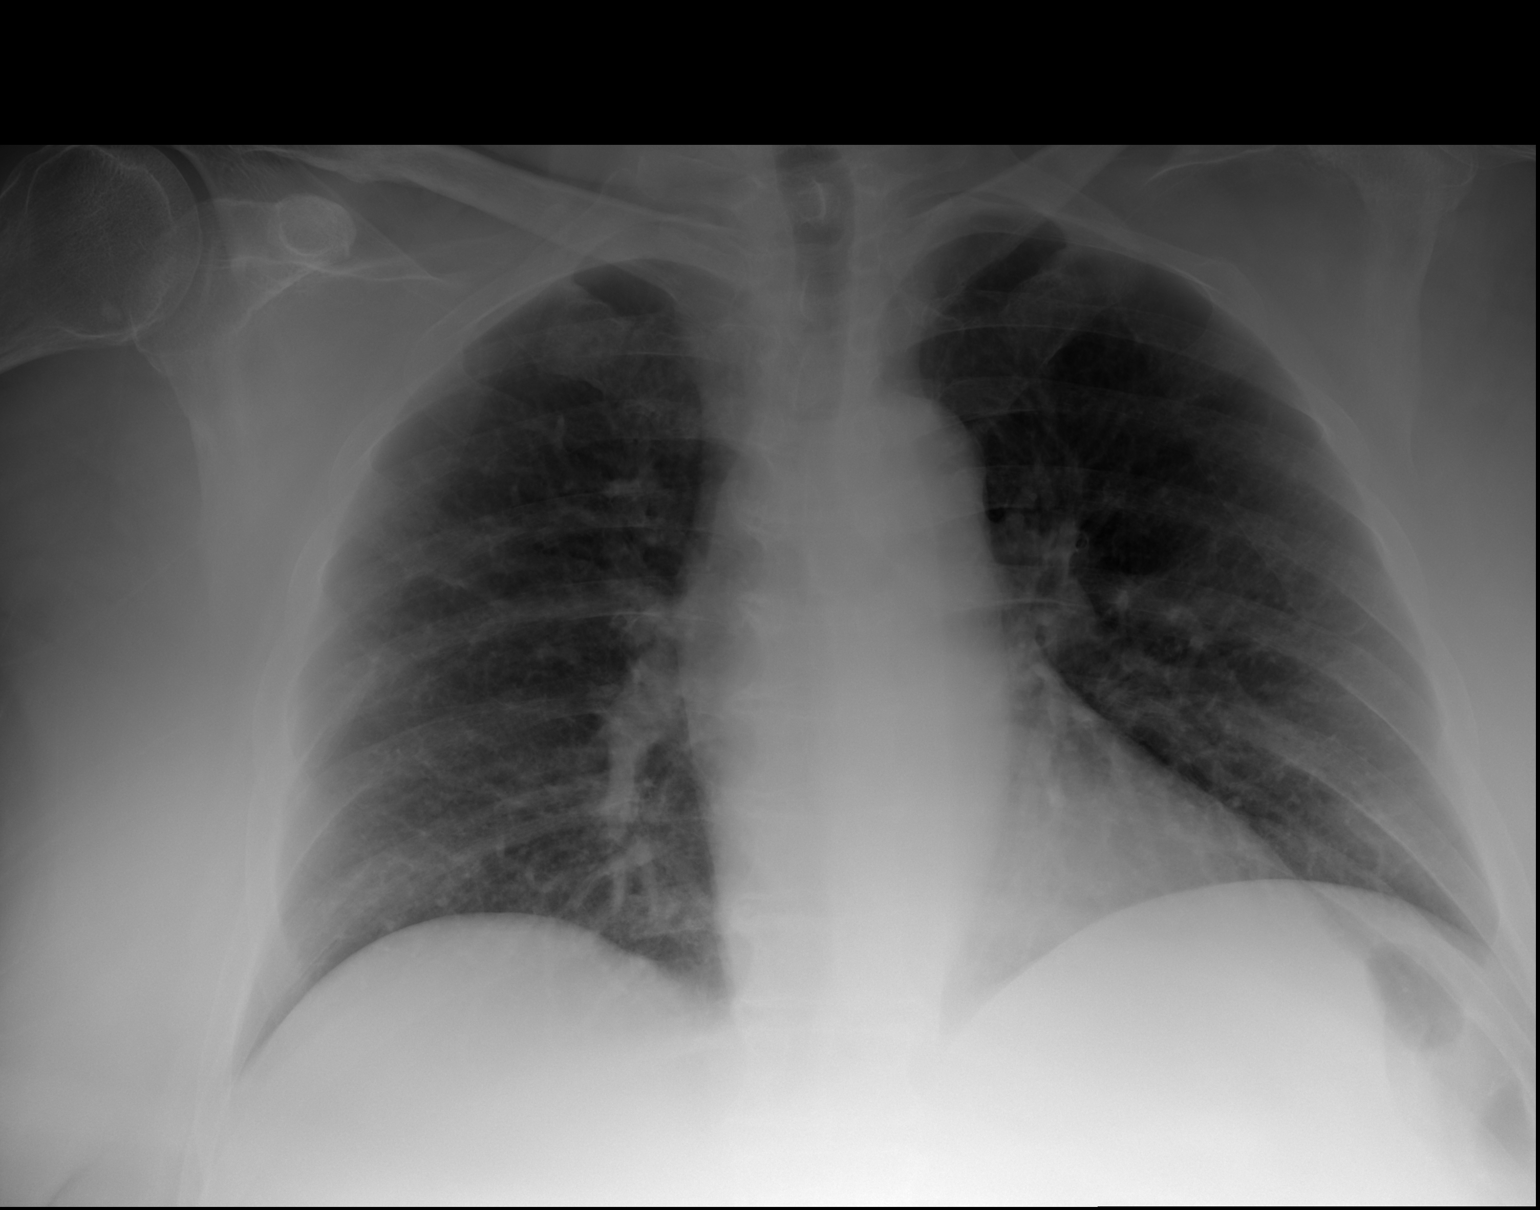

[2 of 2 positions shown; findings below may reference images not displayed]

FINDINGS: The heart size and mediastinal contours are within normal limits.
Low lung volumes. No consolidation. No visible pleural effusions or
pneumothorax. The visualized skeletal structures are unremarkable.
IMPRESSION: No evidence of acute cardiopulmonary disease.

## 2020-07-08 MED ORDER — LOSARTAN POTASSIUM 25 MG PO TABS
100.0000 mg | ORAL_TABLET | Freq: Every day | ORAL | Status: DC
  Administered 2020-07-08: 100 mg via ORAL
  Filled 2020-07-08: qty 4

## 2020-07-08 MED ORDER — PRAVASTATIN SODIUM 20 MG PO TABS
40.0000 mg | ORAL_TABLET | Freq: Every day | ORAL | Status: DC
  Administered 2020-07-08: 40 mg via ORAL
  Filled 2020-07-08: qty 2

## 2020-07-08 MED ORDER — ALUM & MAG HYDROXIDE-SIMETH 200-200-20 MG/5ML PO SUSP: 30.0000 mL | Freq: Four times a day (QID) | ORAL | Status: DC | PRN

## 2020-07-08 MED ORDER — TAMSULOSIN HCL 0.4 MG PO CAPS
0.4000 mg | ORAL_CAPSULE | Freq: Every day | ORAL | Status: DC
  Administered 2020-07-08: 0.4 mg via ORAL
  Filled 2020-07-08: qty 1

## 2020-07-08 MED ORDER — ACETAMINOPHEN 325 MG PO TABS
650.0000 mg | ORAL_TABLET | ORAL | Status: DC | PRN
  Administered 2020-07-08 (×2): 650 mg via ORAL
  Filled 2020-07-08 (×2): qty 2

## 2020-07-08 MED ORDER — INSULIN DEGLUDEC 100 UNIT/ML ~~LOC~~ SOPN: 20.0000 [IU] | PEN_INJECTOR | Freq: Every day | SUBCUTANEOUS | Status: DC

## 2020-07-08 MED ORDER — GABAPENTIN 300 MG PO CAPS: 300.0000 mg | ORAL_CAPSULE | ORAL | Status: DC

## 2020-07-08 MED ORDER — ONDANSETRON HCL 4 MG PO TABS: 4.0000 mg | ORAL_TABLET | Freq: Three times a day (TID) | ORAL | Status: DC | PRN

## 2020-07-08 MED ORDER — INSULIN GLARGINE 100 UNIT/ML ~~LOC~~ SOLN
20.0000 [IU] | Freq: Every day | SUBCUTANEOUS | Status: DC
  Administered 2020-07-08: 20 [IU] via SUBCUTANEOUS
  Filled 2020-07-08: qty 0.2

## 2020-07-08 MED ORDER — ZOLPIDEM TARTRATE 5 MG PO TABS: 5.0000 mg | ORAL_TABLET | Freq: Every evening | ORAL | Status: DC | PRN

## 2020-07-08 MED ORDER — NICOTINE 21 MG/24HR TD PT24
21.0000 mg | MEDICATED_PATCH | Freq: Every day | TRANSDERMAL | Status: DC
  Filled 2020-07-08: qty 1

## 2020-07-08 NOTE — ED Notes (Signed)
Pt resting comfortably at this time.

## 2020-07-08 NOTE — Progress Notes (Signed)
Inpatient Diabetes Program Recommendations  AACE/ADA: New Consensus Statement on Inpatient Glycemic Control   Target Ranges:  Prepandial:   less than 140 mg/dL      Peak postprandial:   less than 180 mg/dL (1-2 hours)      Critically ill patients:  140 - 180 mg/dL   Results for [name], [name] (MRN [id number]) as of 07/08/2020 14:27  Ref. Range 07/08/2020 02:11 07/08/2020 08:31  Glucose-Capillary Latest Ref Range: 70 - 99 mg/dL 299 (H) 342 (H)  Lantus 20 units@9 :05   Review of Glycemic Control  Diabetes history: DM2 Outpatient Diabetes medications: Tresiba 20 units daily Current orders for Inpatient glycemic control: Lantus 20 units daily  Inpatient Diabetes Program Recommendations:    Insulin: Please consider ordering CBGS AC&HS with Novolog 0-20 units TID with meals and Novolog 0-5 units QHS.  Thanks, Tyler Alderman, RN, MSN, CDE Diabetes Coordinator Inpatient Diabetes Program [phone] (Team Pager from 8am to 5pm)

## 2020-07-08 NOTE — Consult Note (Signed)
[facility] Psych ED Progress Note  07/08/2020 12:40 PM Tyler Brewer  MRN:  [id number] Subjective: Patient states "I do not care where to go as long as is not home."  Patient reports current stressors include physical pain (back pain and scrotal pain x 6 months) "I have to move and I do not have the money to move, and I want to get off drugs."  Patient reports passive suicidal ideations related to situation.  Patient denies plan or intent to harm self currently.  Patient reports suicide attempt approximately 3 days ago when he "took sleeping pills."  Patient denies self-harm behaviors.  Patient reports he has been diagnosed with bipolar disorder type II in the past.  Patient denies any outpatient psychiatric treatment currently.  Patient reports he is unable to recall medications that they "did not help."  Patient denies any medications times approximately 1 year.  Patient [facility] a roommate currently.  Patient denies access to weapons.  Patient reports he is disabled related to difficulty standing.  Patient denies alcohol use.  Patient reports methamphetamine use times approximately 6 years.  Patient reports last use on yesterday.  Patient reports "my family is tired of me and they do not deal with me and all of my friends are on drugs and I have got to get away from the drugs."  Patient reports he would like to "get off drugs."  Patient reports he does not know how to get help.  Patient reports he believes meth amphetamines help his pain but states "I need to stop because of the overall impact on my health and it is not good."  Patient reports he sleeps well when he uses meth however when he does not use meds he finds himself unable to sleep related to physical pain.  Patient reports his appetite is "up and down."  Patient assessed by nurse practitioner.  Patient alert and oriented, answers appropriately.  Patient denies homicidal ideations.  Patient denies both auditory  and visual hallucinations.  There is no evidence of delusional thought content and no indication that the patient is responding to internal stimuli.  Patient denies symptoms of paranoia.  Patient offered support and encouragement. Principal Problem: Substance induced mood disorder (HCC) Diagnosis:  Principal Problem:   Substance induced mood disorder (HCC) Active Problems:   Methamphetamine use disorder, severe, dependence ([place])  Total Time spent with patient: 20 minutes  Past Psychiatric History: Attention deficit disorder, panic attacks, drug abuse and dependence, depression with anxiety, substance-induced mood disorder, methamphetamine use disorder  Past Medical History:  Past Medical History:  Diagnosis Date  . Arthritis   . CAD (coronary artery disease)  cardiac cath with moderate disease in a septal branch of the ramus intermedius 04/01/2012  . Depression   . Diabetes mellitus    poorly controlled by his report  . History of narcotic addiction ([place])    past history of back pain  . Hypercholesteremia   . Hypertension   . IBS (irritable bowel syndrome)   . 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[place])   . Neuropathy   . Obesity    Max weight was 390  . OSA on CPAP   . Panic attacks   . Testosterone deficiency   . Vertigo     Past Surgical History:  Procedure Laterality Date  . CARDIAC CATHETERIZATION    . LEFT HEART CATHETERIZATION WITH CORONARY ANGIOGRAM N/A 03/31/2012   Procedure: LEFT HEART CATHETERIZATION WITH CORONARY ANGIOGRAM;  Surgeon: Leonie Man, MD;  Location: [facility];  Service: Cardiovascular;  Laterality: N/A;   Family History:  Family History  Problem Relation Age of Onset  . Diabetes type II Father   . Hypertension Father   . Pancreatic disease Father 60       Deceased  . Healthy Mother   . Healthy Sister   . Healthy Son   . Healthy Daughter   . Parkinson's disease Maternal Grandmother    Family Psychiatric  History: None reported Social History:   Social History   Substance and Sexual Activity  Alcohol Use No  . Alcohol/week: 0.0 standard drinks     Social History   Substance and Sexual Activity  Drug Use Yes  . Types: Methamphetamines    Social History   Socioeconomic History  . Marital status: Single    Spouse name: Not on file  . Number of children: 2  . Years of education: Not on file  . Highest education level: Not on file  Occupational History  . Not on file  Tobacco Use  . Smoking status: Current Every Day Smoker    Packs/day: 0.50    Types: Cigarettes  . Smokeless tobacco: Never Used  Vaping Use  . Vaping Use: Never used  Substance and Sexual Activity  . Alcohol use: No    Alcohol/week: 0.0 standard drinks  . Drug use: Yes    Types: Methamphetamines  . Sexual activity: Yes  Other Topics Concern  . Not on file  Social History Narrative     Has 2 grown children.     Works as a Barrister's clerk.  Education: Oceanographer.   Right Handed   Drinks Caffeine    Mother recently moved and sold her house-- pt is living with a friend in a trailer    Social Determinants of Health   Financial Resource Strain:   . Difficulty of Paying Living Expenses: Not on file  Food Insecurity:   . Worried About Charity fundraiser in the Last Year: Not on file  . Ran Out of Food in the Last Year: Not on file  Transportation Needs:   . Lack of Transportation (Medical): Not on file  . Lack of Transportation (Non-Medical): Not on file  Physical Activity:   . Days of Exercise per Week: Not on file  . Minutes of Exercise per Session: Not on file  Stress:   . Feeling of Stress : Not on file  Social Connections:   . Frequency of Communication with Friends and Family: Not on file  . Frequency of Social Gatherings with Friends and Family: Not on file  . Attends Religious Services: Not on file  . Active Member of Clubs or Organizations: Not on file  . Attends Archivist Meetings: Not on file  . Marital Status: Not on file     Sleep: Fair  Appetite:  Fair  Current Medications: Current Facility-Administered Medications  Medication Dose Route Frequency Provider Last Rate Last Admin  . acetaminophen (TYLENOL) tablet 650 mg  650 mg Oral Q4H PRN Petrucelli, Samantha R, PA-C   650 mg at 07/08/20 2778  . alum & mag hydroxide-simeth (MAALOX/MYLANTA) 200-200-20 MG/5ML suspension 30 mL  30 mL Oral Q6H PRN Petrucelli, Samantha R, PA-C      . gabapentin (NEURONTIN) capsule 300-600 mg  300-600 mg Oral See admin instructions Petrucelli, Samantha R, PA-C      . insulin glargine (LANTUS) injection 20 Units  20 Units Subcutaneous Daily Palumbo, April, MD   20  Units at 07/08/20 0905  . losartan (COZAAR) tablet 100 mg  100 mg Oral Daily Petrucelli, Samantha R, PA-C   100 mg at 07/08/20 0905  . nicotine (NICODERM CQ - dosed in mg/24 hours) patch 21 mg  21 mg Transdermal Daily Petrucelli, Samantha R, PA-C      . ondansetron (ZOFRAN) tablet 4 mg  4 mg Oral Q8H PRN Petrucelli, Samantha R, PA-C      . pravastatin (PRAVACHOL) tablet 40 mg  40 mg Oral Daily Petrucelli, Samantha R, PA-C   40 mg at 07/08/20 0905  . tamsulosin (FLOMAX) capsule 0.4 mg  0.4 mg Oral Daily Petrucelli, Samantha R, PA-C   0.4 mg at 07/08/20 0905  . zolpidem (AMBIEN) tablet 5 mg  5 mg Oral QHS PRN Petrucelli, Samantha R, PA-C       Current Outpatient Medications  Medication Sig Dispense Refill  . acetaminophen (TYLENOL) 325 MG tablet Take 650 mg by mouth every 6 (six) hours as needed for mild pain, fever or headache.    . gabapentin (NEURONTIN) 300 MG capsule Take 2 tablets in the morning, 1 tablet in the afternoon, and 1 tablet at bedtime. (Patient taking differently: Take 300-600 mg by mouth See admin instructions. 600mg  in the morning 300mg  in the afternoon 300mg  at bedtime) 120 capsule 5  . insulin degludec (TRESIBA FLEXTOUCH) 100 UNIT/ML FlexTouch Pen Inject 10 Units into the skin daily. (Patient taking differently: Inject 20 Units into the skin daily. ) 3  mL 5  . losartan (COZAAR) 100 MG tablet Take 1 tablet (100 mg total) by mouth daily. 90 tablet 3  . naproxen sodium (ALEVE) 220 MG tablet Take 440-660 mg by mouth 2 (two) times daily as needed (pain/headache).    . pravastatin (PRAVACHOL) 40 MG tablet Take 1 tablet (40 mg total) by mouth daily. 30 tablet 4  . tamsulosin (FLOMAX) 0.4 MG CAPS capsule Take 1 capsule (0.4 mg total) by mouth daily. 90 capsule 3    Lab Results:  Results for orders placed or performed during the hospital encounter of 07/07/20 (from the past 48 hour(s))  Rapid urine drug screen (hospital performed)     Status: Abnormal   Collection Time: 07/07/20  9:20 PM  Result Value Ref Range   Opiates NONE DETECTED NONE DETECTED   Cocaine NONE DETECTED NONE DETECTED   Benzodiazepines NONE DETECTED NONE DETECTED   Amphetamines POSITIVE (A) NONE DETECTED   Tetrahydrocannabinol NONE DETECTED NONE DETECTED   Barbiturates NONE DETECTED NONE DETECTED    Comment: (NOTE) DRUG SCREEN FOR MEDICAL PURPOSES ONLY.  IF CONFIRMATION IS NEEDED FOR ANY PURPOSE, NOTIFY LAB WITHIN 5 DAYS.  LOWEST DETECTABLE LIMITS FOR URINE DRUG SCREEN Drug Class                     Cutoff (ng/mL) Amphetamine and metabolites    1000 Barbiturate and metabolites    200 Benzodiazepine                 390 Tricyclics and metabolites     300 Opiates and metabolites        300 Cocaine and metabolites        300 THC                            50 Performed at [facility] [place] [facility], [place] [address] [place], [place] [address]   Comprehensive metabolic panel     Status: Abnormal  Collection Time: 07/07/20  9:32 PM  Result Value Ref Range   Sodium 130 (L) 135 - 145 mmol/L   Potassium 4.4 3.5 - 5.1 mmol/L   Chloride 96 (L) 98 - 111 mmol/L   CO2 22 22 - 32 mmol/L   Glucose, Bld 441 (H) 70 - 99 mg/dL    Comment: Glucose reference range applies only to samples taken after fasting for at least 8 hours.   BUN 19 6 - 20 mg/dL   Creatinine, Ser 1.16  0.61 - 1.24 mg/dL   Calcium 9.4 8.9 - 10.3 mg/dL   Total Protein 7.4 6.5 - 8.1 g/dL   Albumin 4.1 3.5 - 5.0 g/dL   AST 20 15 - 41 U/L   ALT 31 0 - 44 U/L   Alkaline Phosphatase 89 38 - 126 U/L   Total Bilirubin 0.6 0.3 - 1.2 mg/dL   GFR, Estimated >60 >60 mL/min    Comment: (NOTE) Calculated using the CKD-EPI Creatinine Equation (2021)    Anion gap 12 5 - 15    Comment: Performed at [facility], [place] [address] [place], [place] [address]  Ethanol     Status: None   Collection Time: 07/07/20  9:32 PM  Result Value Ref Range   Alcohol, Ethyl (B) <10 <10 mg/dL    Comment: (NOTE) Lowest detectable limit for serum alcohol is 10 mg/dL.  For medical purposes only. Performed at [facility], [place] [address], [place], [place] [address]   Salicylate level     Status: Abnormal   Collection Time: 07/07/20  9:32 PM  Result Value Ref Range   Salicylate Lvl <0.2 (L) 7.0 - 30.0 mg/dL    Comment: Performed at [facility], [place] [address] [place], [place] [address]  Acetaminophen level     Status: Abnormal   Collection Time: 07/07/20  9:32 PM  Result Value Ref Range   Acetaminophen (Tylenol), Serum <10 (L) 10 - 30 ug/mL    Comment: (NOTE) Therapeutic concentrations vary significantly. A range of 10-30 ug/mL  may be an effective concentration for many patients. However, some  are best treated at concentrations outside of this range. Acetaminophen concentrations >150 ug/mL at 4 hours after ingestion  and >50 ug/mL at 12 hours after ingestion are often associated with  toxic reactions.  Performed at [facility], [place] [address] [place], [place] [address]   cbc     Status: None   Collection Time: 07/07/20  9:32 PM  Result Value Ref Range   WBC 7.5 4.0 - 10.5 K/uL   RBC 5.51 4.22 - 5.81 MIL/uL   Hemoglobin 16.4 13.0 - 17.0 g/dL   HCT 47.3 39 - 52 %   MCV 85.8 80.0 - 100.0 fL   MCH 29.8 26.0 - 34.0 pg   MCHC  34.7 30.0 - 36.0 g/dL   RDW 12.4 11.5 - 15.5 %   Platelets 328 150 - 400 K/uL   nRBC 0.0 0.0 - 0.2 %    Comment: Performed at [facility], [place] [address], [place], [place] [address]  Resp Panel by RT-PCR (Flu A&B, Covid) Nasopharyngeal Swab     Status: None   Collection Time: 07/07/20 10:39 PM   Specimen: Nasopharyngeal Swab; Nasopharyngeal(NP) swabs in vial transport medium  Result Value Ref Range   SARS Coronavirus 2 by RT PCR NEGATIVE NEGATIVE    Comment: (NOTE) SARS-CoV-2 target nucleic acids are NOT DETECTED.  The SARS-CoV-2 RNA is  generally detectable in upper respiratory specimens during the acute phase of infection. The lowest concentration of SARS-CoV-2 viral copies this assay can detect is 138 copies/mL. A negative result does not preclude SARS-Cov-2 infection and should not be used as the sole basis for treatment or other patient management decisions. A negative result may occur with  improper specimen collection/handling, submission of specimen other than nasopharyngeal swab, presence of viral mutation(s) within the areas targeted by this assay, and inadequate number of viral copies(<138 copies/mL). A negative result must be combined with clinical observations, patient history, and epidemiological information. The expected result is Negative.  Fact Sheet for Patients:  EntrepreneurPulse.com.au  Fact Sheet for Healthcare Providers:  IncredibleEmployment.be  This test is no t yet approved or cleared by the [place] FDA and  has been authorized for detection and/or diagnosis of SARS-CoV-2 by FDA under an Emergency Use Authorization (EUA). This EUA will remain  in effect (meaning this test can be used) for the duration of the COVID-19 declaration under Section 564(b)(1) of the Act, 21 U.S.C.section 360bbb-3(b)(1), unless the authorization is terminated  or revoked sooner.       Influenza A by PCR NEGATIVE  NEGATIVE   Influenza B by PCR NEGATIVE NEGATIVE    Comment: (NOTE) The Xpert Xpress SARS-CoV-2/FLU/RSV plus assay is intended as an aid in the diagnosis of influenza from Nasopharyngeal swab specimens and should not be used as a sole basis for treatment. Nasal washings and aspirates are unacceptable for Xpert Xpress SARS-CoV-2/FLU/RSV testing.  Fact Sheet for Patients: EntrepreneurPulse.com.au  Fact Sheet for Healthcare Providers: IncredibleEmployment.be  This test is not yet approved or cleared by the [place] FDA and has been authorized for detection and/or diagnosis of SARS-CoV-2 by FDA under an Emergency Use Authorization (EUA). This EUA will remain in effect (meaning this test can be used) for the duration of the COVID-19 declaration under Section 564(b)(1) of the Act, 21 U.S.C. section 360bbb-3(b)(1), unless the authorization is terminated or revoked.  Performed at [facility], [place] [address], [place], [place] [address]   POC CBG, ED     Status: Abnormal   Collection Time: 07/08/20  2:11 AM  Result Value Ref Range   Glucose-Capillary 299 (H) 70 - 99 mg/dL    Comment: Glucose reference range applies only to samples taken after fasting for at least 8 hours.  CBG monitoring, ED     Status: Abnormal   Collection Time: 07/08/20  8:31 AM  Result Value Ref Range   Glucose-Capillary 342 (H) 70 - 99 mg/dL    Comment: Glucose reference range applies only to samples taken after fasting for at least 8 hours.    Blood Alcohol level:  Lab Results  Component Value Date   ETH <10 07/07/2020   ETH <5 02/10/2016    Physical Findings: AIMS:  , ,  ,  ,    CIWA:    COWS:     Musculoskeletal: Strength & Muscle Tone: decreased Gait & Station: reports requires cane to ambulate Patient leans: N/A  Psychiatric Specialty Exam: Physical Exam Vitals and nursing note reviewed.  Constitutional:      Appearance: Normal  appearance. He is well-developed.  HENT:     Head: Normocephalic.  Cardiovascular:     Rate and Rhythm: Normal rate.  Pulmonary:     Effort: Pulmonary effort is normal.  Neurological:     Mental Status: He is alert and oriented to person, place, and time.  Psychiatric:  Attention and Perception: Attention and perception normal.        Mood and Affect: Mood and affect normal.        Speech: Speech normal.        Behavior: Behavior normal. Behavior is cooperative.        Thought Content: Thought content includes suicidal ideation.        Cognition and Memory: Cognition and memory normal.        Judgment: Judgment normal.     Review of Systems  Constitutional: Negative.   HENT: Negative.   Eyes: Negative.   Respiratory: Negative.   Cardiovascular: Negative.   Gastrointestinal: Negative.   Genitourinary: Negative.   Musculoskeletal: Negative.   Skin: Negative.   Neurological: Negative.   Psychiatric/Behavioral: Positive for suicidal ideas.    Blood pressure (!) 104/93, pulse 92, temperature 98.4 F (36.9 C), temperature source Oral, resp. rate 18, height 5\' 11"  (1.803 m), weight 132.4 kg, SpO2 99 %.Body mass index is 40.7 kg/m.  General Appearance: Casual  Eye Contact:  Fair  Speech:  Clear and Coherent and Normal Rate  Volume:  Normal  Mood:  Anxious  Affect:  Appropriate  Thought Process:  Coherent, Goal Directed and Descriptions of Associations: Intact  Orientation:  Full (Time, Place, and Person)  Thought Content:  Logical  Suicidal Thoughts:  Yes.  without intent/plan  Homicidal Thoughts:  No  Memory:  Immediate;   Good Recent;   Good Remote;   Good  Judgement:  Fair  Insight:  Fair  Psychomotor Activity:  Normal  Concentration:  Concentration: Good and Attention Span: Good  Recall:  Good  Fund of Knowledge:  Good  Language:  Good  Akathisia:  No  Handed:  Right  AIMS (if indicated):     Assets:  Communication Skills Desire for Improvement Financial  Resources/Insurance Resilience  ADL's:  Intact  Cognition:  WNL  Sleep:         Treatment Plan Summary: Daily contact with patient to assess and evaluate symptoms and progress in treatment  Inpatient psychiatric treatment recommended.  Patient reviewed with Dr. Serafina Mitchell.  Emmaline Kluver, FNP 07/08/2020, 12:40 PM

## 2020-07-08 NOTE — [facility] Counselor (Signed)
Clinician spoke to [place], [place], clinician to call the TTS cart in five minutes.    Vertell Novak, [place], [facility] Triage Specialist [phone]

## 2020-07-08 NOTE — ED Notes (Signed)
Pt currently in X-ray.

## 2020-07-08 NOTE — ED Notes (Signed)
Pt ambulatory to restroom

## 2020-07-08 NOTE — BH Assessment (Signed)
Tele Assessment Note   Patient Name: [facility] MRN: [id number] Referring Physician: Amaryllis Dyke, PA-C. Location of Patient: [facility] ED, [facility]. Location of Provider: [place]  [name] is an 59 y.o. male, who presents voluntary and unaccompanied to [facility]. Clinician asked the pt, "what brought you to the [facility]?" Pt reported, he's been thinking about killing himself for a few days. Pt reported, a lot of things including physical pain triggered his suicidal thoughts. Pt reported, he has a plan to overdose on Heroin and sleeping pills. Pt denies, HI, AVH, self-injurious behaviors and access to weapons.   Pt reported, taking a couple hits off a bowl, yesterday afternoon. Pt's UDS is positive for amphetamines. Pt denies, being linked to OPT resources (medication management and/or counseling.) Pt reported, a previous inpatient admission at [facility] in 2013.  Pt presents quiet, awake with logical, coherent speech. Pt's mood, affect was depressed. Pt's thought process was coherent, relevant. Pt's judgement was impaired. Pt was oriented x4. Pt's concentration was normal. Pt's insight and impulse control was fair. Pt reported, if discharged from [facility] he will hurt himself, he has Heroin at home ready to use.   *Pt denies, having family, friend supports.*  Diagnosis: Major Depressive Disorder.   Past Medical History:  Past Medical History:  Diagnosis Date  . Arthritis   . CAD (coronary artery disease)  cardiac cath with moderate disease in a septal branch of the ramus intermedius 04/01/2012  . Depression   . Diabetes mellitus    poorly controlled by his report  . History of narcotic addiction ([facility])    past history of back pain  . Hypercholesteremia   . Hypertension   . IBS (irritable bowel syndrome)   . 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 Neuropathy   . Obesity    Max weight was 390  . OSA on CPAP   . Panic attacks   . Testosterone  deficiency   . Vertigo     Past Surgical History:  Procedure Laterality Date  . CARDIAC CATHETERIZATION    . LEFT HEART CATHETERIZATION WITH CORONARY ANGIOGRAM N/A 03/31/2012   Procedure: LEFT HEART CATHETERIZATION WITH CORONARY ANGIOGRAM;  Surgeon: Tyler Man, MD;  Location: [facility];  Service: Cardiovascular;  Laterality: N/A;    Family History:  Family History  Problem Relation Age of Onset  . Diabetes type II Father   . Hypertension Father   . Pancreatic disease Father 85       Deceased  . Healthy Mother   . Healthy Sister   . Healthy Son   . Healthy Daughter   . Parkinson's disease Maternal Grandmother     Social History:  reports that he has been smoking cigarettes. He has been smoking about 0.50 packs per day. He has never used smokeless tobacco. He reports current drug use. Drug: Methamphetamines. He reports that he does not drink alcohol.  Additional Social History:  Alcohol / Drug Use Pain Medications: See MAR Prescriptions: See MAR Over the Counter: See MAR History of alcohol / drug use?: Yes Substance #1 Name of Substance 1: Methaphetamines. 1 - Age of First Use: UTA 1 - Amount (size/oz): Pt reported, taking a couple hits off a bowl. 1 - Frequency: Pt reported, once or twice a week. 1 - Duration: Ongoing. 1 - Last Use / Amount: Pt reported, "yesterday afternoon."  CIWA: CIWA-Ar BP: (!) 152/94 Pulse Rate: 84 COWS:    Allergies:  Allergies  Allergen Reactions  .  Gabapentin Other (See Comments)    Ill tempered    Home Medications: (Not in a [facility] admission)   OB/GYN Status:  No LMP for male patient.  General Assessment Data [facility] of Assessment: [facility] TTS Assessment: In [facility] Is this a Tele or Face-to-Face Assessment?: Tele Assessment Is this an Initial Assessment or a Re-assessment for this encounter?: Initial Assessment Patient Accompanied by:: N/A Language Other than English: No Living Arrangements: Other (Comment) (Room mate.  ) What gender do you identify as?: Male Date Telepsych consult ordered in [facility]: 07/07/20 Time Telepsych consult ordered in [facility]: 2239 Marital status: Divorced Living Arrangements: Non-relatives/Friends Can pt return to current living arrangement?: Yes Admission Status: Voluntary Is patient capable of signing voluntary admission?: Yes Referral Source: Self/Family/Friend Insurance type: [employer].      Crisis Care Plan Living Arrangements: Non-relatives/Friends Legal Guardian: Other: (Self. ) Name of Psychiatrist: None.  Name of Therapist: None.   Education Status Is patient currently in school?: No  Risk to self with the past 6 months Suicidal Ideation: Yes-Currently Present Has patient been a risk to self within the past 6 months prior to admission? : Yes Suicidal Intent: Yes-Currently Present Has patient had any suicidal intent within the past 6 months prior to admission? : Yes Is patient at risk for suicide?: Yes Suicidal Plan?: Yes-Currently Present Has patient had any suicidal plan within the past 6 months prior to admission? : Yes Specify Current Suicidal Plan: Pt reported, overdosing on Heroin and sleeping pills.  Access to Means: Yes Specify Access to Suicidal Means: Pt reported, he has sleeping pills at home.  What has been your use of drugs/alcohol within the last 12 months?: Methamphetamines.  Previous Attempts/Gestures: Yes How many times?: 1 Triggers for Past Attempts: Unknown Intentional Self Injurious Behavior: None (Pt denies.) Family Suicide History: No Recent stressful life event(s): Other (Comment) (Pt reported, a lot of things going on, physical pain. ) Persecutory voices/beliefs?: No Depression: Yes Depression Symptoms: Feeling angry/irritable, Feeling worthless/self pity, Fatigue, Isolating, Tearfulness, Insomnia, Despondent (Hopelessness. ) Substance abuse history and/or treatment for substance abuse?: Yes Suicide prevention information given to  non-admitted patients: Not applicable  Risk to Others within the past 6 months Homicidal Ideation:  (Pt denies. Pt reported, if somebody sets him off. ) Does patient have any lifetime risk of violence toward others beyond the six months prior to admission? : No (Pt denies. ) Thoughts of Harm to Others: No (Pt denies. ) Current Homicidal Intent: No Current Homicidal Plan: No Access to Homicidal Means: No Identified Victim: NA History of harm to others?: No Assessment of Violence: None Noted Violent Behavior Description: NA Does patient have access to weapons?: No (Pt denies. ) Criminal Charges Pending?: No Does patient have a court date: No Is patient on probation?: Yes (Pt on probation for embezzling money. )  Psychosis Hallucinations: None noted (Pt denies.) Delusions: None noted (Pt denies.)  Mental Status Report Appearance/Hygiene: In scrubs Eye Contact: Fair Motor Activity: Unremarkable Speech: Logical/coherent Level of Consciousness: Quiet/awake Mood: Depressed Affect: Depressed Anxiety Level: Minimal Thought Processes: Coherent, Relevant Judgement: Impaired Orientation: Person, Place, Time, Situation Obsessive Compulsive Thoughts/Behaviors: None  Cognitive Functioning Concentration: Normal Memory: Recent Intact Is patient IDD: No Insight: Fair Impulse Control: Fair Appetite: Fair Sleep: Decreased Total Hours of Sleep:  (Pt reported, he'll go day and days without sleeping. ) Vegetative Symptoms: None  ADLScreening [facility]) Patient's cognitive ability adequate to safely complete daily activities?: Yes Patient able to express need for assistance  with ADLs?: Yes Independently performs ADLs?: Yes (appropriate for developmental age)  Prior Inpatient Therapy Prior Inpatient Therapy: Yes Prior Therapy Dates: 2013 Prior Therapy Facilty/Provider(s): [facility].   Prior Outpatient Therapy Prior Outpatient Therapy: No Does patient have an ACCT  team?: No Does patient have Intensive In-House Services?  : No Does patient have [facility] services? : No Does patient have P4CC services?: No  ADL Screening (condition at time of admission) Patient's cognitive ability adequate to safely complete daily activities?: Yes Is the patient deaf or have difficulty hearing?: No Does the patient have difficulty seeing, even when wearing glasses/contacts?: No Does the patient have difficulty concentrating, remembering, or making decisions?: No Patient able to express need for assistance with ADLs?: Yes Does the patient have difficulty dressing or bathing?: No Independently performs ADLs?: Yes (appropriate for developmental age) Does the patient have difficulty walking or climbing stairs?: Yes (Pt needs cane to walk due to back pain.) Weakness of Legs: None Weakness of Arms/Hands: None  Home Assistive Devices/Equipment Home Assistive Devices/Equipment: Eyeglasses, Cane (specify quad or straight)    Abuse/Neglect Assessment (Assessment to be complete while patient is alone) Abuse/Neglect Assessment Can Be Completed: Yes Physical Abuse: Denies Verbal Abuse: Denies Sexual Abuse: Denies Exploitation of patient/patient's resources: Denies Self-Neglect: Denies     Regulatory affairs officer (For Healthcare) Does Patient Have a Medical Advance Directive?: No      Disposition: Lindon Romp, PMHNP recommends inpatient treatment. Per Larose Kells, RN, pt's blood sugar to be under 250 for 24 hours before medically cleared. Disposition CSW to seek placement. Clinician Audie Clear, PA and [place], RN via secure chart in Epic the pt's disposition.   Disposition Initial Assessment Completed for this Encounter: Yes  This service was provided via telemedicine using a 2-way, interactive audio and video technology.  Names of all persons participating in this telemedicine service and their role in this encounter. Name: Gerron Guidotti. Role: Patient.  Name:  Vertell Novak, MS, [facility], [place]. Role: Counselor.          Vertell Novak 07/08/2020 5:54 AM    Vertell Novak, [place], [facility], [place] Triage Specialist [phone]

## 2020-07-08 NOTE — Patient Outreach (Signed)
CPSS met with Pt an was able to gain information to better assist Pt. CPSS was made aware from Tom Hughes that he is working on finding placement for Pt.  

## 2020-07-08 NOTE — BH Assessment (Signed)
[place] Assessment Progress Note  Per Letitia Libra, NP, this voluntary pt requires psychiatric hospitalization at this time.  At 13:18 Shirlean Mylar calls from [facility].  Pt has been accepted to their facility by Dr Caren Hazy to the [facility], Rm 152-2.  EDP Charlesetta Shanks, MD, concurs with this disposition, as does the pt.  He has signed the [place] Consent for Admission form which has been faxed back to [facility].  Dr Johnney Killian and pt's nurse, Abby, have been notified, and Abby agrees to call report to [phone].  Pt is to be transported via [facility].  [facility] stipulates that pt must arrive either before 23:00 tonight, or after 06:00 tomorrow.    Jalene Mullet, [place] Coordinator [phone]

## 2020-07-08 NOTE — ED Notes (Addendum)
Pt has 2 belonging bags and a bookbag with personal items. These bags were labeled and placed in cabinets 23-25 and hall C area on the top shelf.

## 2020-07-08 NOTE — BH Assessment (Signed)
[facility] Assessment Progress Note  Per Letitia Libra, NP, this voluntary pt requires psychiatric hospitalization at this time.  The following facilities have been contacted to seek placement for this pt, with results as noted:  Beds available, information sent, decision pending: [facility]  Unable to reach: [facility] (left message at 12:13; awaiting call back) [facility] (called at 12:54; not able to leave message; will try again later)  Not referred: [facility] (geriatric unit is currently closed) [facility] (geriatric unit is currently closed) [facility] (only accepting IVC patients currently) [facility] (substance abuse is exclusionary)  At capacity: [facility]  If this voluntary pt is accepted to a facility, please discuss disposition with pt to be sure that they agree to the plan.  If a facility agrees to accept pt and the plan changes in any way please call the facility to inform them of the change.  Final disposition is pending as of this writing.  Jalene Mullet, [place] Coordinator [phone]

## 2020-07-09 DIAGNOSIS — R69 Illness, unspecified: Secondary | ICD-10-CM | POA: Diagnosis not present

## 2020-07-09 DIAGNOSIS — Z794 Long term (current) use of insulin: Secondary | ICD-10-CM | POA: Diagnosis not present

## 2020-07-09 DIAGNOSIS — F314 Bipolar disorder, current episode depressed, severe, without psychotic features: Secondary | ICD-10-CM | POA: Diagnosis not present

## 2020-07-09 DIAGNOSIS — I10 Essential (primary) hypertension: Secondary | ICD-10-CM | POA: Diagnosis not present

## 2020-07-09 DIAGNOSIS — E1142 Type 2 diabetes mellitus with diabetic polyneuropathy: Secondary | ICD-10-CM | POA: Diagnosis not present

## 2020-07-10 DIAGNOSIS — F314 Bipolar disorder, current episode depressed, severe, without psychotic features: Secondary | ICD-10-CM | POA: Diagnosis not present

## 2020-07-10 DIAGNOSIS — I10 Essential (primary) hypertension: Secondary | ICD-10-CM | POA: Diagnosis not present

## 2020-07-10 DIAGNOSIS — Z794 Long term (current) use of insulin: Secondary | ICD-10-CM | POA: Diagnosis not present

## 2020-07-10 DIAGNOSIS — E1142 Type 2 diabetes mellitus with diabetic polyneuropathy: Secondary | ICD-10-CM | POA: Diagnosis not present

## 2020-07-10 DIAGNOSIS — R69 Illness, unspecified: Secondary | ICD-10-CM | POA: Diagnosis not present

## 2020-07-19 ENCOUNTER — Encounter: Payer: Self-pay | Admitting: Family Medicine

## 2020-07-19 ENCOUNTER — Other Ambulatory Visit: Payer: Self-pay

## 2020-07-19 ENCOUNTER — Ambulatory Visit (INDEPENDENT_AMBULATORY_CARE_PROVIDER_SITE_OTHER): Payer: Medicare HMO | Admitting: Family Medicine

## 2020-07-19 VITALS — BP 160/120 | HR 94 | Temp 98.2°F | Resp 20 | Ht 71.0 in | Wt 289.2 lb

## 2020-07-19 DIAGNOSIS — M5441 Lumbago with sciatica, right side: Secondary | ICD-10-CM | POA: Insufficient documentation

## 2020-07-19 DIAGNOSIS — E785 Hyperlipidemia, unspecified: Secondary | ICD-10-CM

## 2020-07-19 DIAGNOSIS — F1920 Other psychoactive substance dependence, uncomplicated: Secondary | ICD-10-CM

## 2020-07-19 DIAGNOSIS — I10 Essential (primary) hypertension: Secondary | ICD-10-CM

## 2020-07-19 DIAGNOSIS — M25559 Pain in unspecified hip: Secondary | ICD-10-CM

## 2020-07-19 DIAGNOSIS — E1165 Type 2 diabetes mellitus with hyperglycemia: Secondary | ICD-10-CM | POA: Diagnosis not present

## 2020-07-19 DIAGNOSIS — R45851 Suicidal ideations: Secondary | ICD-10-CM

## 2020-07-19 DIAGNOSIS — M25552 Pain in left hip: Secondary | ICD-10-CM

## 2020-07-19 DIAGNOSIS — E1140 Type 2 diabetes mellitus with diabetic neuropathy, unspecified: Secondary | ICD-10-CM

## 2020-07-19 DIAGNOSIS — R69 Illness, unspecified: Secondary | ICD-10-CM | POA: Diagnosis not present

## 2020-07-19 DIAGNOSIS — F32A Depression, unspecified: Secondary | ICD-10-CM

## 2020-07-19 DIAGNOSIS — IMO0002 Reserved for concepts with insufficient information to code with codable children: Secondary | ICD-10-CM

## 2020-07-19 MED ORDER — DULOXETINE HCL 30 MG PO CPEP: 30.0000 mg | ORAL_CAPSULE | Freq: Every day | ORAL | 3 refills | Status: DC

## 2020-07-19 MED ORDER — INSULIN ASPART 100 UNIT/ML ~~LOC~~ SOLN: 10.0000 [IU] | Freq: Three times a day (TID) | SUBCUTANEOUS | 99 refills | Status: DC

## 2020-07-19 MED ORDER — KETOROLAC TROMETHAMINE 60 MG/2ML IM SOLN
60.0000 mg | Freq: Once | INTRAMUSCULAR | Status: AC
  Administered 2020-07-19: 60 mg via INTRAMUSCULAR

## 2020-07-19 NOTE — Assessment & Plan Note (Signed)
High today but pt has not taken his meds and he is in a lot of pain  He will take his meds when he gets home

## 2020-07-19 NOTE — Progress Notes (Signed)
Patient ID: [facility], male    DOB: 1961/05/30  Age: 59 y.o. MRN: [id number]    Subjective:  Subjective  HPI [facility] presents for beh health admission f/u for suicidal ideation, mood disorder / bipolar disorder and methamphetamine abuse  11/19-11/23  He has f/u with psych next week   He lost the cymbalta rx He denies suicidal / homicidal ideation now He is under a lot of stress due to needing to find a new place to live and not having the money to do that  He missed his ortho app because he was in the hosp and is in even more pain so he is not sleeping well.    Review of Systems  Constitutional: Negative for appetite change, diaphoresis, fatigue and unexpected weight change.  Eyes: Negative for pain, redness and visual disturbance.  Respiratory: Negative for cough, chest tightness, shortness of breath and wheezing.   Cardiovascular: Negative for chest pain, palpitations and leg swelling.  Endocrine: Negative for cold intolerance, heat intolerance, polydipsia, polyphagia and polyuria.  Genitourinary: Negative for difficulty urinating, dysuria and frequency.  Musculoskeletal: Positive for back pain and gait problem.  Neurological: Negative for dizziness, light-headedness, numbness and headaches.  Psychiatric/Behavioral: Positive for decreased concentration, dysphoric mood and sleep disturbance. Negative for self-injury and suicidal ideas.    History Past Medical History:  Diagnosis Date  . Arthritis   . CAD (coronary artery disease)  cardiac cath with moderate disease in a septal branch of the ramus intermedius 04/01/2012  . Depression   . Diabetes mellitus    poorly controlled by his report  . History of narcotic addiction ([place])    past history of back pain  . Hypercholesteremia   . Hypertension   . IBS (irritable bowel syndrome)   . 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[place])   . Neuropathy   . Obesity    Max weight was 390  . OSA on CPAP   . Panic attacks   . Testosterone  deficiency   . Vertigo     He has a past surgical history that includes Cardiac catheterization and left heart catheterization with coronary angiogram (N/A, 03/31/2012).   His family history includes Diabetes type II in his father; Healthy in his daughter, mother, sister, and son; Hypertension in his father; Pancreatic disease (age of onset: 52) in his father; Parkinson's disease in his maternal grandmother.He reports that he has been smoking cigarettes. He has been smoking about 0.50 packs per day. He has never used smokeless tobacco. He reports current drug use. Drug: Methamphetamines. He reports that he does not drink alcohol.  Current Outpatient Medications on File Prior to Visit  Medication Sig Dispense Refill  . acetaminophen (TYLENOL) 325 MG tablet Take 650 mg by mouth every 6 (six) hours as needed for mild pain, fever or headache.    . gabapentin (NEURONTIN) 300 MG capsule Take 2 tablets in the morning, 1 tablet in the afternoon, and 1 tablet at bedtime. (Patient taking differently: Take 300-600 mg by mouth See admin instructions. 600mg  in the morning 300mg  in the afternoon 300mg  at bedtime) 120 capsule 5  . insulin degludec (TRESIBA FLEXTOUCH) 100 UNIT/ML FlexTouch Pen Inject 10 Units into the skin daily. (Patient taking differently: Inject 20 Units into the skin daily. ) 3 mL 5  . losartan (COZAAR) 100 MG tablet Take 1 tablet (100 mg total) by mouth daily. 90 tablet 3  . naproxen sodium (ALEVE) 220 MG tablet Take 440-660 mg by mouth 2 (two) times daily as needed (pain/headache).    [name] Kitchen  pravastatin (PRAVACHOL) 40 MG tablet Take 1 tablet (40 mg total) by mouth daily. 30 tablet 4  . tamsulosin (FLOMAX) 0.4 MG CAPS capsule Take 1 capsule (0.4 mg total) by mouth daily. 90 capsule 3   No current facility-administered medications on file prior to visit.     Objective:  Objective  Physical Exam Vitals and nursing note reviewed.  Constitutional:      General: He is sleeping.      Appearance: He is well-developed.  HENT:     Head: Normocephalic and atraumatic.  Eyes:     Pupils: Pupils are equal, round, and reactive to light.  Neck:     Thyroid: No thyromegaly.  Cardiovascular:     Rate and Rhythm: Normal rate and regular rhythm.     Heart sounds: No murmur heard.   Pulmonary:     Effort: Pulmonary effort is normal. No respiratory distress.     Breath sounds: Normal breath sounds. No wheezing or rales.  Chest:     Chest wall: No tenderness.  Musculoskeletal:        General: Tenderness present.     Cervical back: Normal range of motion and neck supple.  Skin:    General: Skin is warm and dry.  Neurological:     Mental Status: He is oriented to person, place, and time.     Motor: Weakness present.     Gait: Gait abnormal.  Psychiatric:        Attention and Perception: He is inattentive.        Mood and Affect: Mood is depressed. Affect is blunt.        Behavior: Behavior normal. Behavior is cooperative.        Thought Content: Thought content normal. Thought content is not paranoid or delusional. Thought content does not include homicidal or suicidal ideation. Thought content does not include homicidal or suicidal plan.        Judgment: Judgment normal.    BP (!) 160/120 (BP Location: Left Arm, Patient Position: Sitting, Cuff Size: Large)   Pulse 94   Temp 98.2 F (36.8 C) (Oral)   Resp 20   Ht 5\' 11"  (1.803 m)   Wt 289 lb 3.2 oz (131.2 kg)   SpO2 95%   BMI 40.34 kg/m  Wt Readings from Last 3 Encounters:  07/19/20 289 lb 3.2 oz (131.2 kg)  07/07/20 291 lb 12.8 oz (132.4 kg)  07/05/20 291 lb 12.8 oz (132.4 kg)     Lab Results  Component Value Date   WBC 7.5 07/07/2020   HGB 16.4 07/07/2020   HCT 47.3 07/07/2020   PLT 328 07/07/2020   GLUCOSE 441 (H) 07/07/2020   CHOL 158 07/05/2020   TRIG 123.0 07/05/2020   HDL 50.00 07/05/2020   LDLDIRECT 124.0 05/02/2017   LDLCALC 83 07/05/2020   ALT 31 07/07/2020   AST 20 07/07/2020   NA 130 (L)  07/07/2020   K 4.4 07/07/2020   CL 96 (L) 07/07/2020   CREATININE 1.16 07/07/2020   BUN 19 07/07/2020   CO2 22 07/07/2020   TSH 2.66 03/10/2020   PSA 1.15 03/10/2020   INR 1.1 07/30/2013   HGBA1C 11.5 (H) 07/05/2020   MICROALBUR <0.7 05/02/2017    DG Chest 2 View  Result Date: 07/08/2020 CLINICAL DATA:  Mental status change. EXAM: CHEST - 2 VIEW COMPARISON:  June 25, 2020 FINDINGS: The heart size and mediastinal contours are within normal limits. Low lung volumes. No consolidation. No visible pleural effusions  or pneumothorax. The visualized skeletal structures are unremarkable. IMPRESSION: No evidence of acute cardiopulmonary disease. Electronically Signed   By: Margaretha Sheffield MD   On: 07/08/2020 13:52     Assessment & Plan:  Plan  I am having Thomasenia Bottoms start on DULoxetine and insulin aspart. I am also having him maintain his pravastatin, tamsulosin, gabapentin, acetaminophen, naproxen sodium, losartan, and Tresiba FlexTouch.  Meds ordered this encounter  Medications  . DULoxetine (CYMBALTA) 30 MG capsule    Sig: Take 1 capsule (30 mg total) by mouth daily.    Dispense:  60 capsule    Refill:  3  . insulin aspart (NOVOLOG) 100 UNIT/ML injection    Sig: Inject 10 Units into the skin 3 (three) times daily with meals.    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)    Inc tresiba to 40 u and add reg insulin tid with meals  Referral to endo pending       Relevant Medications   insulin aspart (NOVOLOG) 100 UNIT/ML  injection   Drug abuse and dependence (HCC)    Pt has not used methamphetamine since d/c F/u psych       Hip pain, acute, left    toradol given  F/u ortho       HTN (hypertension), benign (Chronic)    High today but pt has not taken his meds and he is in a lot of pain  He will take his meds when he gets home       Hyperlipemia (Chronic)    Tolerating statin, encouraged heart healthy diet, avoid trans fats, minimize simple carbs and saturated fats. Increase exercise as tolerated Lab Results  Component Value Date   CHOL 158 07/05/2020   HDL 50.00 07/05/2020   LDLCALC 83 07/05/2020   LDLDIRECT 124.0 05/02/2017   TRIG 123.0 07/05/2020   CHOLHDL 3 07/05/2020          Other Visit Diagnoses    Hip pain       Relevant Medications   DULoxetine (CYMBALTA) 30 MG capsule   Other Relevant Orders   Ambulatory referral to Orthopedic Surgery   Uncontrolled type 2 diabetes mellitus with hyperglycemia (HCC)       Relevant Medications   insulin aspart (NOVOLOG) 100 UNIT/ML injection   Other Relevant Orders   Ambulatory referral to Endocrinology     Pt app >50 min discussing depression , mood disorder, severe pain and dm  Follow-up: Return in about 3 months (around 10/17/2020), or if symptoms worsen or fail to improve.  [name], DO

## 2020-07-19 NOTE — Patient Instructions (Signed)
Suicidal Feelings: How to Help Yourself Suicide is when you end your own life. There are many things you can do to help yourself feel better when struggling with these feelings. Many services and people are available to support you and others who struggle with similar feelings.  If you ever feel like you may hurt yourself or others, or have thoughts about taking your own life, get help right away. To get help:  Call your local emergency services (911 in the [place]  [employer]'s health and human services helpline (211 in the [place]  Go to your nearest emergency department.  Call a suicide hotline to speak with a trained counselor. The following suicide hotlines are available in the [place]: ? 1-800-273-TALK (1-800-273-8255). ? 1-800-SUICIDE (1-800-784-2433). ? 1-888-628-9454. This is a hotline for Spanish speakers. ? 1-800-799-4889. This is a hotline for TTY users. ? 1-866-4-U-TREVOR (1-866-488-7386). This is a hotline for lesbian, gay, bisexual, transgender, or questioning youth. ? For a list of hotlines in [place], visit www.suicide.org/hotlines/international/[place]-suicide-hotlines.html  Contact a crisis center or a local suicide prevention center. To find a crisis center or suicide prevention center: ? Call your local hospital, clinic, community service organization, mental health center, social service provider, or health department. Ask for help with connecting to a crisis center. ? For a list of crisis centers in the [place], visit: suicidepreventionlifeline.org ? For a list of crisis centers in [place], visit: suicideprevention.ca How to help yourself feel better   Promise yourself that you will not do anything extreme when you have suicidal feelings. Remember, there is hope. Many people have gotten through suicidal thoughts and feelings, and you can too. If you have had these feelings before, remind yourself that you can get through them again.  Let family, friends,  teachers, or counselors know how you are feeling. Try not to separate yourself from those who care about you and want to help you. Talk with someone every day, even if you do not feel sociable. Face-to-face conversation is best to help them understand your feelings.  Contact a mental health care provider and work with this person regularly.  Make a safety plan that you can follow during a crisis. Include phone numbers of suicide prevention hotlines, mental health professionals, and trusted friends and family members you can call during an emergency. Save these numbers on your phone.  If you are thinking of taking a lot of medicine, give your medicine to someone who can give it to you as prescribed. If you are on antidepressants and are concerned you will overdose, tell your health care provider so that he or she can give you safer medicines.  Try to stick to your routines. Follow a schedule every day. Make self-care a priority.  Make a list of realistic goals, and cross them off when you achieve them. Accomplishments can give you a sense of worth.  Wait until you are feeling better before doing things that you find difficult or unpleasant.  Do things that you have always enjoyed to take your mind off your feelings. Try reading a book, or listening to or playing music. Spending time outside, in nature, may help you feel better. Follow these instructions at home:   Visit your primary health care provider every year for a checkup.  Work with a mental health care provider as needed.  Eat a well-balanced diet, and eat regular meals.  Get plenty of rest.  Exercise if you are able. Just 30 minutes of exercise each day can   help you feel better.  Take over-the-counter and prescription medicines only as told by your health care provider. Ask your mental health care provider about the possible side effects of any medicines you are taking.  Do not use alcohol or drugs, and remove these substances  from your home.  Remove weapons, poisons, knives, and other deadly items from your home. General recommendations  Keep your living space well lit.  When you are feeling well, write yourself a letter with tips and support that you can read when you are not feeling well.  Remember that life's difficulties can be sorted out with help. Conditions can be treated, and you can learn behaviors and ways of thinking that will help you. Where to find more information  National Suicide Prevention Lifeline: www.suicidepreventionlifeline.org  Hopeline: www.hopeline.com  American Foundation for Suicide Prevention: www.afsp.org  The Trevor Project (for lesbian, gay, bisexual, transgender, or questioning youth): www.thetrevorproject.org Contact a health care provider if:  You feel as though you are a burden to others.  You feel agitated, angry, vengeful, or have extreme mood swings.  You have withdrawn from family and friends. Get help right away if:  You are talking about suicide or wishing to die.  You start making plans for how to commit suicide.  You feel that you have no reason to live.  You start making plans for putting your affairs in order, saying goodbye, or giving your possessions away.  You feel guilt, shame, or unbearable pain, and it seems like there is no way out.  You are frequently using drugs or alcohol.  You are engaging in risky behaviors that could lead to death. If you have any of these symptoms, get help right away. Call emergency services, go to your nearest emergency department or crisis center, or call a suicide crisis helpline. Summary  Suicide is when you take your own life.  Promise yourself that you will not do anything extreme when you have suicidal feelings.  Let family, friends, teachers, or counselors know how you are feeling.  Get help right away if you feel as though life is getting too tough to handle and you are thinking about suicide. This  information is not intended to replace advice given to you by your health care provider. Make sure you discuss any questions you have with your health care provider. Document Revised: 11/27/2018 Document Reviewed: 03/19/2017 Elsevier Patient Education  2020 Elsevier Inc.  

## 2020-07-19 NOTE — Assessment & Plan Note (Signed)
cymbalta sent back in to pharmacy F/u psych

## 2020-07-19 NOTE — Assessment & Plan Note (Signed)
Tolerating statin, encouraged heart healthy diet, avoid trans fats, minimize simple carbs and saturated fats. Increase exercise as tolerated Lab Results  Component Value Date   CHOL 158 07/05/2020   HDL 50.00 07/05/2020   LDLCALC 83 07/05/2020   LDLDIRECT 124.0 05/02/2017   TRIG 123.0 07/05/2020   CHOLHDL 3 07/05/2020

## 2020-07-19 NOTE — Assessment & Plan Note (Signed)
Pt has not used methamphetamine since d/c F/u psych

## 2020-07-19 NOTE — Assessment & Plan Note (Signed)
Inc tresiba to 40 u and add reg insulin tid with meals  Referral to endo pending

## 2020-07-19 NOTE — Assessment & Plan Note (Signed)
toradol given  F/u ortho

## 2020-07-19 NOTE — Addendum Note (Signed)
Addended by: Sanda Linger on: 07/19/2020 01:00 PM   Modules accepted: Orders

## 2020-07-19 NOTE — Assessment & Plan Note (Signed)
Will reschedule ortho app  toradol IM given today

## 2020-07-22 ENCOUNTER — Encounter: Payer: Medicare HMO | Admitting: Thoracic Surgery (Cardiothoracic Vascular Surgery)

## 2020-07-25 DIAGNOSIS — M25552 Pain in left hip: Secondary | ICD-10-CM | POA: Diagnosis not present

## 2020-07-25 DIAGNOSIS — R52 Pain, unspecified: Secondary | ICD-10-CM | POA: Diagnosis not present

## 2020-07-26 ENCOUNTER — Ambulatory Visit (INDEPENDENT_AMBULATORY_CARE_PROVIDER_SITE_OTHER): Payer: Medicare HMO | Admitting: Orthopaedic Surgery

## 2020-07-26 ENCOUNTER — Telehealth (HOSPITAL_COMMUNITY): Payer: Medicare HMO | Admitting: Psychiatry

## 2020-07-26 ENCOUNTER — Other Ambulatory Visit: Payer: Self-pay

## 2020-07-26 ENCOUNTER — Ambulatory Visit: Payer: Self-pay

## 2020-07-26 ENCOUNTER — Encounter: Payer: Self-pay | Admitting: Orthopaedic Surgery

## 2020-07-26 VITALS — Ht 70.0 in | Wt 291.0 lb

## 2020-07-26 DIAGNOSIS — M5416 Radiculopathy, lumbar region: Secondary | ICD-10-CM | POA: Diagnosis not present

## 2020-07-26 DIAGNOSIS — M25552 Pain in left hip: Secondary | ICD-10-CM

## 2020-07-26 DIAGNOSIS — G8929 Other chronic pain: Secondary | ICD-10-CM | POA: Diagnosis not present
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
## 2020-07-26 NOTE — Progress Notes (Signed)
Subjective: Patient is here for ultrasound-guided intra-articular left hip injection.   Groin pain with ambulation.  Objective:  Pain with IR.  Procedure: Ultrasound guided injection is preferred based studies that show increased duration, increased effect, greater accuracy, decreased procedural pain, increased response rate, and decreased cost with ultrasound guided versus blind injection.   Verbal informed consent obtained.  Time-out conducted.  Noted no overlying erythema, induration, or other signs of local infection. Ultrasound-guided left hip injection: After sterile prep with Betadine, injected 3 cc 0.25% Marcaine without epinephrine and 1 cc betamethasone using a 22-gauge spinal needle, passing the needle through the iliofemoral ligament into the femoral head/neck junction.  Very good pain relief during the anesthetic phase.

## 2020-07-27 ENCOUNTER — Encounter (HOSPITAL_BASED_OUTPATIENT_CLINIC_OR_DEPARTMENT_OTHER): Payer: Self-pay

## 2020-07-27 ENCOUNTER — Emergency Department (HOSPITAL_BASED_OUTPATIENT_CLINIC_OR_DEPARTMENT_OTHER)
Admission: EM | Admit: 2020-07-27 | Discharge: 2020-07-27 | Disposition: A | Discharge status: Home / Self Care | Payer: Medicare HMO | Attending: Emergency Medicine | Admitting: Emergency Medicine

## 2020-07-27 ENCOUNTER — Other Ambulatory Visit: Payer: Self-pay

## 2020-07-27 DIAGNOSIS — Z794 Long term (current) use of insulin: Secondary | ICD-10-CM | POA: Diagnosis not present

## 2020-07-27 DIAGNOSIS — E1165 Type 2 diabetes mellitus with hyperglycemia: Secondary | ICD-10-CM | POA: Diagnosis not present

## 2020-07-27 DIAGNOSIS — I159 Secondary hypertension, unspecified: Secondary | ICD-10-CM

## 2020-07-27 DIAGNOSIS — I10 Essential (primary) hypertension: Secondary | ICD-10-CM | POA: Diagnosis not present

## 2020-07-27 DIAGNOSIS — I2510 Atherosclerotic heart disease of native coronary artery without angina pectoris: Secondary | ICD-10-CM | POA: Insufficient documentation

## 2020-07-27 DIAGNOSIS — E1169 Type 2 diabetes mellitus with other specified complication: Secondary | ICD-10-CM | POA: Diagnosis not present

## 2020-07-27 DIAGNOSIS — E1140 Type 2 diabetes mellitus with diabetic neuropathy, unspecified: Secondary | ICD-10-CM | POA: Diagnosis not present

## 2020-07-27 DIAGNOSIS — Z79899 Other long term (current) drug therapy: Secondary | ICD-10-CM | POA: Insufficient documentation

## 2020-07-27 DIAGNOSIS — R42 Dizziness and giddiness: Secondary | ICD-10-CM | POA: Diagnosis not present

## 2020-07-27 DIAGNOSIS — R69 Illness, unspecified: Secondary | ICD-10-CM | POA: Diagnosis not present

## 2020-07-27 DIAGNOSIS — E161 Other hypoglycemia: Secondary | ICD-10-CM | POA: Diagnosis not present

## 2020-07-27 DIAGNOSIS — E162 Hypoglycemia, unspecified: Secondary | ICD-10-CM | POA: Diagnosis not present

## 2020-07-27 DIAGNOSIS — F17210 Nicotine dependence, cigarettes, uncomplicated: Secondary | ICD-10-CM | POA: Diagnosis not present

## 2020-07-27 DIAGNOSIS — E7800 Pure hypercholesterolemia, unspecified: Secondary | ICD-10-CM | POA: Diagnosis not present

## 2020-07-27 LAB — BASIC METABOLIC PANEL
Anion gap: 8 (ref 5–15)
BUN: 19 mg/dL (ref 6–20)
CO2: 22 mmol/L (ref 22–32)
Calcium: 7.5 mg/dL — ABNORMAL LOW (ref 8.9–10.3)
Chloride: 104 mmol/L (ref 98–111)
Creatinine, Ser: 0.77 mg/dL (ref 0.61–1.24)
GFR, Estimated: >60 mL/min (ref >60)
Glucose, Bld: 347 mg/dL — ABNORMAL HIGH (ref 70–99)
Potassium: 4.3 mmol/L (ref 3.5–5.1)
Sodium: 134 mmol/L — ABNORMAL LOW (ref 135–145)

## 2020-07-27 LAB — CBC
HCT: 42 % (ref 39.0–52.0)
Hemoglobin: 14.8 g/dL (ref 13.0–17.0)
MCH: 30.2 pg (ref 26.0–34.0)
MCHC: 35.2 g/dL (ref 30.0–36.0)
MCV: 85.7 fL (ref 80.0–100.0)
Platelets: 258 10*3/uL (ref 150–400)
RBC: 4.9 MIL/uL (ref 4.22–5.81)
RDW: 12.4 % (ref 11.5–15.5)
WBC: 9.3 10*3/uL (ref 4.0–10.5)
nRBC: 0 % (ref 0.0–0.2)

## 2020-07-27 LAB — CBG MONITORING, ED: Glucose-Capillary: 369 mg/dL — ABNORMAL HIGH (ref 70–99)

## 2020-07-27 MED ORDER — LOSARTAN POTASSIUM 25 MG PO TABS
100.0000 mg | ORAL_TABLET | Freq: Every day | ORAL | Status: DC
  Administered 2020-07-27: 100 mg via ORAL
  Filled 2020-07-27: qty 4

## 2020-07-27 MED ORDER — MECLIZINE HCL 25 MG PO TABS: 25.0000 mg | ORAL_TABLET | Freq: Three times a day (TID) | ORAL | 0 refills | Status: AC | PRN

## 2020-07-27 MED ORDER — ONDANSETRON HCL 4 MG/2ML IJ SOLN
4.0000 mg | Freq: Once | INTRAMUSCULAR | Status: AC
  Administered 2020-07-27: 4 mg via INTRAVENOUS
  Filled 2020-07-27: qty 2

## 2020-07-27 MED ORDER — IBUPROFEN 800 MG PO TABS
800.0000 mg | ORAL_TABLET | Freq: Once | ORAL | Status: AC
  Administered 2020-07-27: 800 mg via ORAL
  Filled 2020-07-27: qty 1

## 2020-07-27 MED ORDER — ACETAMINOPHEN 325 MG PO TABS
650.0000 mg | ORAL_TABLET | Freq: Once | ORAL | Status: AC
  Administered 2020-07-27: 650 mg via ORAL
  Filled 2020-07-27: qty 2

## 2020-07-27 MED ORDER — MECLIZINE HCL 25 MG PO TABS
25.0000 mg | ORAL_TABLET | Freq: Once | ORAL | Status: AC
  Administered 2020-07-27: 25 mg via ORAL
  Filled 2020-07-27: qty 1

## 2020-07-27 MED ORDER — SODIUM CHLORIDE 0.9 % IV BOLUS
1000.0000 mL | Freq: Once | INTRAVENOUS | Status: AC
  Administered 2020-07-27: 1000 mL via INTRAVENOUS

## 2020-07-27 NOTE — ED Triage Notes (Addendum)
Pt awoke this am with dizziness, like the room is spinning, worse upon standing. Hx of aortic aneurysm. + Orthostatic for EMS. Pt states he had injection in hip yesterday. BG 390 for EMS. Given 530ml NS with EMS.

## 2020-07-27 NOTE — ED Provider Notes (Signed)
[facility] EMERGENCY DEPARTMENT Provider Note   CSN: [id number] Arrival date & time: 07/27/20  1962     History Chief Complaint  Patient presents with  . Dizziness    Tyler Brewer is a 59 y.o. male with a history of coronary artery disease, high blood pressure, high cholesterol, neuropathy, vertigo, present emergency department with recurrent vertigo.  The patient reports that he had a steroid injection done in his hip yesterday for the first time at orthopedic office.  He went to bed feeling normal.  He woke up this morning with acute onset vertigo.  He states that he feels dizzy whenever he stands up.  He denies headache.  He reports he is asymptomatic while sitting at rest.  He feels like he may be somewhat dehydrated does not drink enough fluids.  He reports prior history of vertigo attacks in the past.  Denies history of stroke.  EMS reported "positive orthostatics" but did not relay BP change.  They gave 500 cc of NS IV fluids prior to arrival.  Medical record review shows the patient does have a history of thoracic aneurysm most recently measuring 4.4 cm on CT scan two months ago on 05/31/20, which was stable in size from 3 years prior.  He denies CP currently.  HPI     Past Medical History:  Diagnosis Date  . Arthritis   . CAD (coronary artery disease)  cardiac cath with moderate disease in a septal branch of the ramus intermedius 04/01/2012  . Depression   . Diabetes mellitus    poorly controlled by his report  . History of narcotic addiction ([place])    past history of back pain  . Hypercholesteremia   . Hypertension   . IBS (irritable bowel syndrome)   . 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[place])   . Neuropathy   . Obesity    Max weight was 390  . OSA on CPAP   . Panic attacks   . Testosterone deficiency   . Vertigo     Patient Active Problem List   Diagnosis Date Noted  . Acute left-sided low back pain with right-sided sciatica 07/19/2020  . Hip pain,  acute, left 07/05/2020  . Tremor 07/05/2020  . Weakness 07/05/2020  . Balance problem 03/11/2020  . Tinea cruris 03/11/2020  . Cellulitis of left groin 03/11/2020  . Acute pain of right shoulder 03/11/2020  . Interstitial lung disease ([place]) 05/02/2017  . Pansinusitis 09/26/2016  . Diabetic polyneuropathy associated with diabetes mellitus due to underlying condition ([place]) 05/08/2016  . Methamphetamine use disorder, severe, dependence ([place]) 02/11/2016  . Substance induced mood disorder ([place]) 02/11/2016  . Exertional dyspnea 11/30/2015  . ADD (attention deficit disorder) 09/29/2015  . Binge eating 09/29/2015  . Syncope 05/05/2012  . Diarrhea 05/05/2012  . Panic attacks 05/05/2012  . OSA (obstructive sleep apnea) 05/05/2012  . HTN (hypertension) 05/05/2012  . Diabetes mellitus, type II ([place]) 05/05/2012  . Dyslipidemia 05/05/2012  . CAD (coronary artery disease)  cardiac cath with moderate disease in a septal branch of the ramus intermedius 04/01/2012  . Chest pain 04/01/2012  . Drug abuse and dependence ([place]) 04/01/2012  . Family history of coronary artery disease 03/31/2012  . Sleep apnea, on C-pap 03/31/2012  . Hyperlipemia 03/30/2012  . HTN (hypertension), benign 03/30/2012  . Morbid obesity ([place]) 03/30/2012  . DM type 2, uncontrolled, with neuropathy ([place]) 03/30/2012  . Depression with suicidal ideation 03/30/2012    Past Surgical History:  Procedure Laterality Date  . CARDIAC CATHETERIZATION    .  LEFT HEART CATHETERIZATION WITH CORONARY ANGIOGRAM N/A 03/31/2012   Procedure: LEFT HEART CATHETERIZATION WITH CORONARY ANGIOGRAM;  Surgeon: Leonie Man, MD;  Location: [facility] CATH LAB;  Service: Cardiovascular;  Laterality: N/A;       Family History  Problem Relation Age of Onset  . Diabetes type II Father   . Hypertension Father   . Pancreatic disease Father 40       Deceased  . Healthy Mother   . Healthy Sister   . Healthy Son   . Healthy Daughter   . Parkinson's  disease Maternal Grandmother     Social History   Tobacco Use  . Smoking status: Current Every Day Smoker    Packs/day: 0.50    Types: Cigarettes  . Smokeless tobacco: Never Used  Vaping Use  . Vaping Use: Never used  Substance Use Topics  . Alcohol use: No    Alcohol/week: 0.0 standard drinks  . Drug use: Not Currently    Types: Methamphetamines    Home Medications Prior to Admission medications   Medication Sig Start Date End Date Taking? Authorizing Provider  acetaminophen (TYLENOL) 325 MG tablet Take 650 mg by mouth every 6 (six) hours as needed for mild pain, fever or headache.    [provider]  DULoxetine (CYMBALTA) 30 MG capsule Take 1 capsule (30 mg total) by mouth daily. 07/19/20   [name], DO  gabapentin (NEURONTIN) 300 MG capsule Take 2 tablets in the morning, 1 tablet in the afternoon, and 1 tablet at bedtime. Patient taking differently: Take 300-600 mg by mouth See admin instructions. 600mg  in the morning 300mg  in the afternoon 300mg  at bedtime 04/29/20   Narda Amber K, DO  insulin aspart (NOVOLOG) 100 UNIT/ML injection Inject 10 Units into the skin 3 (three) times daily with meals. 07/19/20   [name] R, DO  insulin degludec (TRESIBA FLEXTOUCH) 100 UNIT/ML FlexTouch Pen Inject 10 Units into the skin daily. Patient taking differently: Inject 20 Units into the skin daily.  07/05/20   [name], DO  losartan (COZAAR) 100 MG tablet Take 1 tablet (100 mg total) by mouth daily. 07/05/20   [name], DO  meclizine (ANTIVERT) 25 MG tablet Take 1 tablet (25 mg total) by mouth 3 (three) times daily as needed for up to 7 days for dizziness. 07/27/20 08/03/20  Wyvonnia Dusky, MD  naproxen sodium (ALEVE) 220 MG tablet Take 440-660 mg by mouth 2 (two) times daily as needed (pain/headache).    [provider]  pravastatin (PRAVACHOL) 40 MG tablet Take 1 tablet (40 mg total) by mouth daily. 03/10/20   [name], DO  tamsulosin (FLOMAX) 0.4 MG CAPS capsule Take 1 capsule (0.4 mg total) by mouth daily. 03/10/20   [name], DO    Allergies    Gabapentin  Review of Systems   Review of Systems  Constitutional: Negative for chills and fever.  HENT: Negative for ear pain and sore throat.   Eyes: Negative for pain and visual disturbance.  Respiratory: Negative for cough and shortness of breath.   Cardiovascular: Negative for chest pain and palpitations.  Gastrointestinal: Negative for abdominal pain and vomiting.  Genitourinary: Negative for dysuria and hematuria.  Musculoskeletal: Negative for arthralgias and back pain.  Skin: Negative for color change and rash.  Neurological: Positive for dizziness and light-headedness. Negative for syncope and headaches.  All other systems reviewed and are negative.   Physical Exam  Updated Vital Signs BP (!) 167/87 (BP Location: Right Arm)   Pulse 84   Temp (!) 97.5 F (36.4 C) (Oral)   Resp 14   Ht 5\' 11"  (1.803 m)   Wt 131.1 kg   SpO2 100%   BMI 40.31 kg/m   Physical Exam Vitals and nursing note reviewed.  Constitutional:      Appearance: He is well-developed.  HENT:     Head: Normocephalic and atraumatic.  Eyes:     Conjunctiva/sclera: Conjunctivae normal.  Cardiovascular:     Rate and Rhythm: Normal rate and regular rhythm.     Pulses: Normal pulses.  Pulmonary:     Effort: Pulmonary effort is normal. No respiratory distress.  Abdominal:     Palpations: Abdomen is soft.     Tenderness: There is no abdominal tenderness.  Musculoskeletal:     Cervical back: Neck supple.  Skin:    General: Skin is warm and dry.  Neurological:     Mental Status: He is alert.     Comments: Head impulse shows corrective saccades. Nystagmus unilateral present. Test of skew negative      ED Results / Procedures / Treatments   Labs (all labs ordered are listed, but only abnormal results are displayed) Labs Reviewed  BASIC  METABOLIC PANEL - Abnormal; Notable for the following components:      Result Value   Sodium 134 (*)    Glucose, Bld 347 (*)    Calcium 7.5 (*)    All other components within normal limits  CBG MONITORING, ED - Abnormal; Notable for the following components:   Glucose-Capillary 369 (*)    All other components within normal limits  CBC    EKG None  Radiology US Guided Needle Placement - No Linked Charges  Result Date: 07/26/2020 Ultrasound guided injection is preferred based studies that show increased duration, increased effect, greater accuracy, decreased procedural pain, increased response rate, and decreased cost with ultrasound guided versus blind injection.   Verbal informed consent obtained.  Time-out conducted.  Noted no overlying erythema, induration, or other signs of local infection. Ultrasound-guided left hip injection: After sterile prep with Betadine, injected 3 cc 0.25% Marcaine without epinephrine and 1 cc betamethasone using a 22-gauge spinal needle, passing the needle through the iliofemoral ligament into the femoral head/neck junction.  Very good pain relief during the anesthetic phase.    Procedures Procedures (including critical care time)  Medications Ordered in ED Medications  losartan (COZAAR) tablet 100 mg (100 mg Oral Given 07/27/20 1314)  sodium chloride 0.9 % bolus 1,000 mL (0 mLs Intravenous Stopped 07/27/20 1139)  meclizine (ANTIVERT) tablet 25 mg (25 mg Oral Given 07/27/20 1014)  ondansetron (ZOFRAN) injection 4 mg (4 mg Intravenous Given 07/27/20 1014)  acetaminophen (TYLENOL) tablet 650 mg (650 mg Oral Given 07/27/20 1312)  ibuprofen (ADVIL) tablet 800 mg (800 mg Oral Given 07/27/20 1313)    ED Course  I have reviewed the triage vital signs and the nursing notes.  Pertinent labs & imaging results that were available during my care of the patient were reviewed by me and considered in my medical decision making (see chart for details).  59 yo male here  with vertigo since this morning  No signs or symptoms of meningitis HINTS exam consistent with peripheral vertigo moreso than posterior stroke.  Hx of vertigo attacks.  This current episode is likely triggered by steroid injections yesterday  We'll check basic labs for anemia, electrolyte derangements, give IV fluids, IV  zofran for nausea, meclizine for suspected BPPV, and reassess  Low suspicion for aortic thoracic disease with this clinical presentation   Clinical Course as of Jul 27 1324  Wed Jul 27, 2020  1035 No significant anemia, BMP with no anion gap, some moderate hyperglycemia    [MT]  1154 Orthostatic VS negative here   [MT]  1300 Symptomatically improved, ambulated to bathroom with cane.  [place] for d/c   [MT]    Clinical Course User Index [MT] Ulla Mckiernan, Carola Rhine, MD    Final Clinical Impression(s) / ED Diagnoses Final diagnoses:  Vertigo  Secondary hypertension    Rx / DC Orders ED Discharge Orders         Ordered    meclizine (ANTIVERT) 25 MG tablet  3 times daily PRN        07/27/20 1302           Wyvonnia Dusky, MD 07/27/20 1325

## 2020-07-28 ENCOUNTER — Telehealth: Payer: Self-pay

## 2020-07-28 MED ORDER — MELOXICAM 7.5 MG PO TABS: 7.5000 mg | ORAL_TABLET | Freq: Two times a day (BID) | ORAL | 2 refills | Status: DC | PRN

## 2020-07-28 NOTE — Telephone Encounter (Signed)
Sounds like he is having a postinjection flareup.  I sent in meloxicam.  If he does not feel any improvement we need to get MRI.

## 2020-07-28 NOTE — Telephone Encounter (Signed)
Patient called he stated he received a injection in his hip he stated all of a sudden the injection stopped working and he is having more pain in his hip. [phone]

## 2020-07-28 NOTE — Addendum Note (Signed)
Addended by: Azucena Cecil on: 07/28/2020 07:14 PM   Modules accepted: Orders

## 2020-07-29 NOTE — Telephone Encounter (Signed)
Called patient no answer. Could not leave VM. Mailbox is full.

## 2020-08-01 ENCOUNTER — Other Ambulatory Visit: Payer: Self-pay

## 2020-08-01 ENCOUNTER — Emergency Department (HOSPITAL_COMMUNITY)
Admission: EM | Admit: 2020-08-01 | Discharge: 2020-08-01 | Disposition: A | Discharge status: Home / Self Care | Payer: Medicare HMO | Attending: Emergency Medicine | Admitting: Emergency Medicine

## 2020-08-01 ENCOUNTER — Emergency Department (HOSPITAL_COMMUNITY): Payer: Medicare HMO

## 2020-08-01 ENCOUNTER — Encounter (HOSPITAL_COMMUNITY): Payer: Self-pay

## 2020-08-01 DIAGNOSIS — R030 Elevated blood-pressure reading, without diagnosis of hypertension: Secondary | ICD-10-CM | POA: Diagnosis not present

## 2020-08-01 DIAGNOSIS — M25552 Pain in left hip: Secondary | ICD-10-CM | POA: Insufficient documentation

## 2020-08-01 DIAGNOSIS — M5416 Radiculopathy, lumbar region: Secondary | ICD-10-CM

## 2020-08-01 DIAGNOSIS — I509 Heart failure, unspecified: Secondary | ICD-10-CM | POA: Insufficient documentation

## 2020-08-01 DIAGNOSIS — I2510 Atherosclerotic heart disease of native coronary artery without angina pectoris: Secondary | ICD-10-CM | POA: Diagnosis not present

## 2020-08-01 DIAGNOSIS — I110 Hypertensive heart disease with heart failure: Secondary | ICD-10-CM | POA: Diagnosis not present

## 2020-08-01 DIAGNOSIS — Z794 Long term (current) use of insulin: Secondary | ICD-10-CM | POA: Diagnosis not present

## 2020-08-01 DIAGNOSIS — I10 Essential (primary) hypertension: Secondary | ICD-10-CM

## 2020-08-01 DIAGNOSIS — E1140 Type 2 diabetes mellitus with diabetic neuropathy, unspecified: Secondary | ICD-10-CM | POA: Insufficient documentation

## 2020-08-01 DIAGNOSIS — Z79899 Other long term (current) drug therapy: Secondary | ICD-10-CM | POA: Diagnosis not present

## 2020-08-01 DIAGNOSIS — G8929 Other chronic pain: Secondary | ICD-10-CM

## 2020-08-01 DIAGNOSIS — F17210 Nicotine dependence, cigarettes, uncomplicated: Secondary | ICD-10-CM | POA: Diagnosis not present

## 2020-08-01 DIAGNOSIS — W19XXXA Unspecified fall, initial encounter: Secondary | ICD-10-CM

## 2020-08-01 DIAGNOSIS — R69 Illness, unspecified: Secondary | ICD-10-CM | POA: Diagnosis not present

## 2020-08-01 IMAGING — CR DG HIP (WITH OR WITHOUT PELVIS) 2-3V*L*
3 series · 3 of 3 positions shown · non-contrast
Comparison: [DATE]

CLINICAL DATA: Fall.  Left hip pain.

EXAM:
DG HIP (WITH OR WITHOUT PELVIS) 2-3V LEFT

[x pelvis]
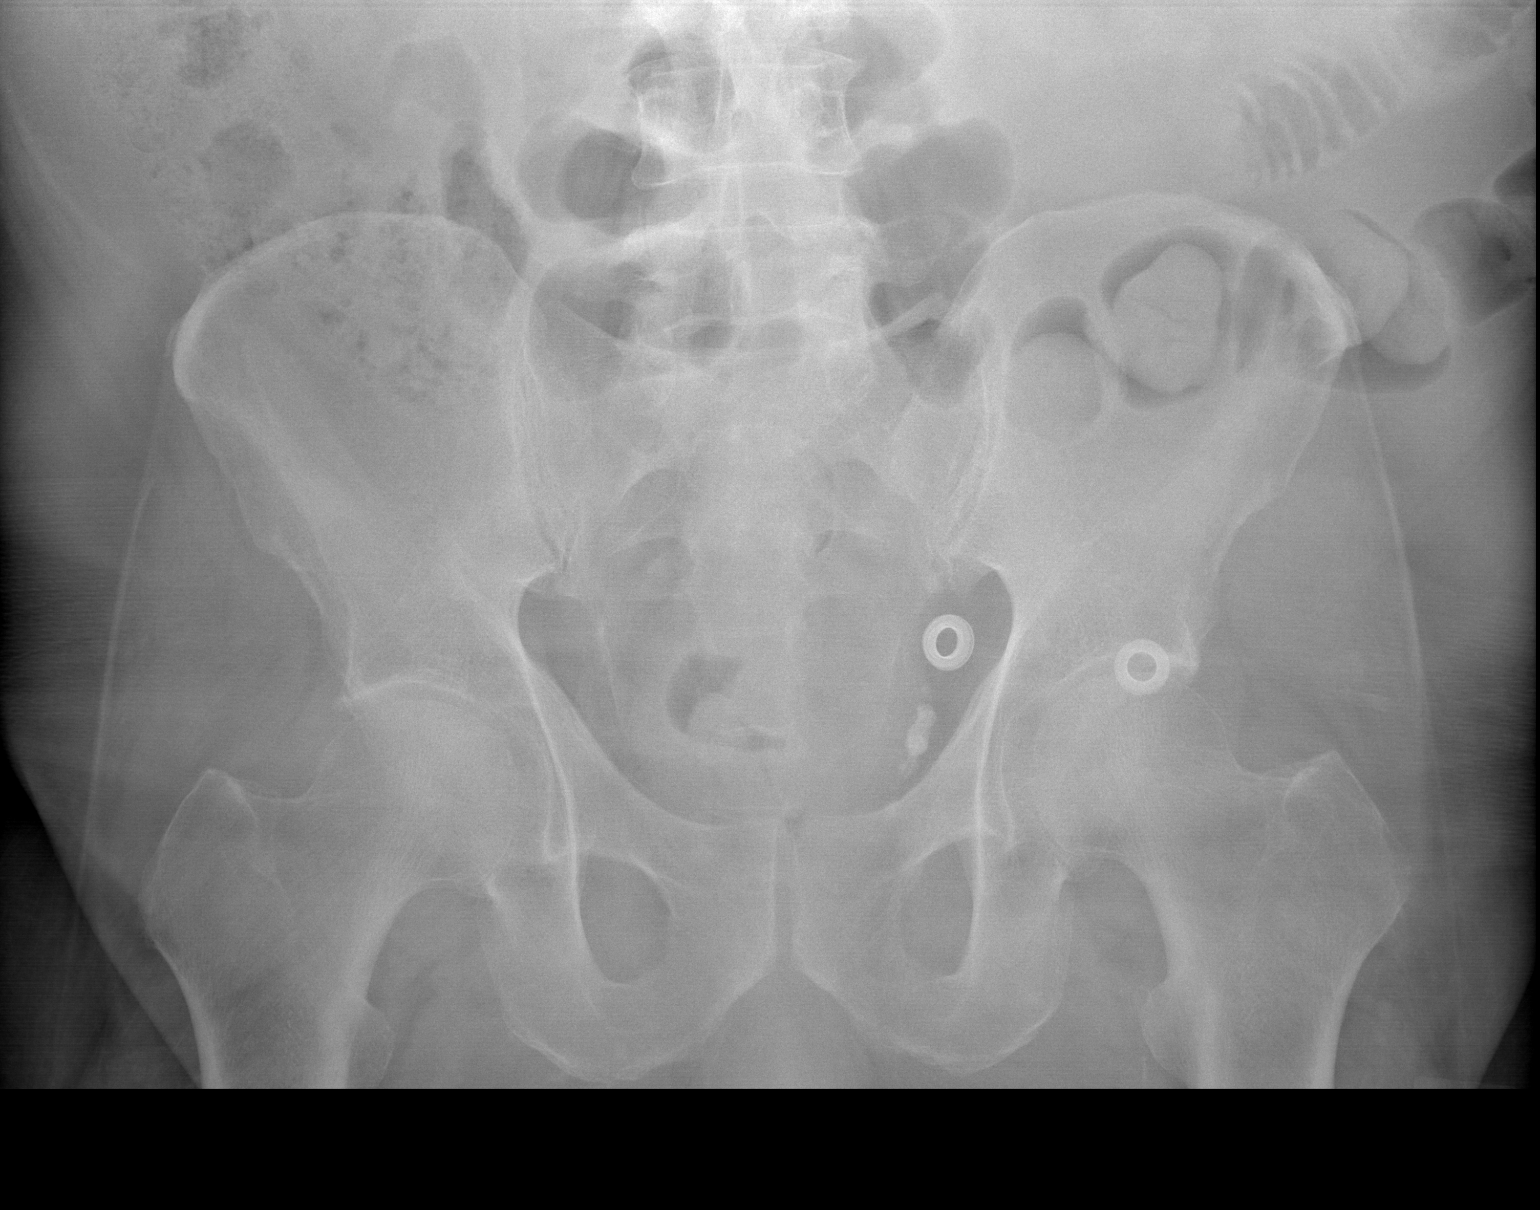

[x hip ap left]
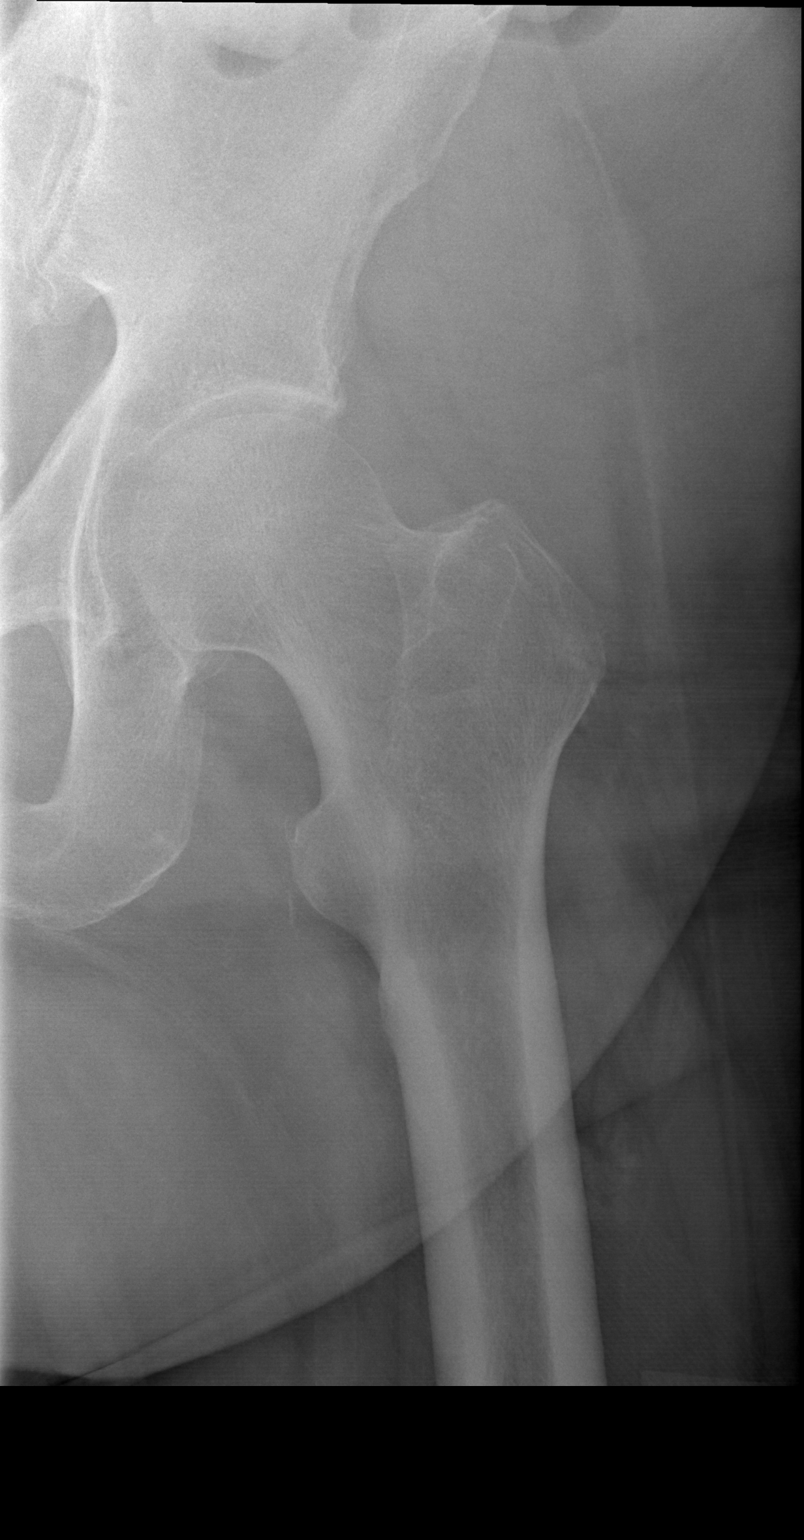

[x hip lat left]
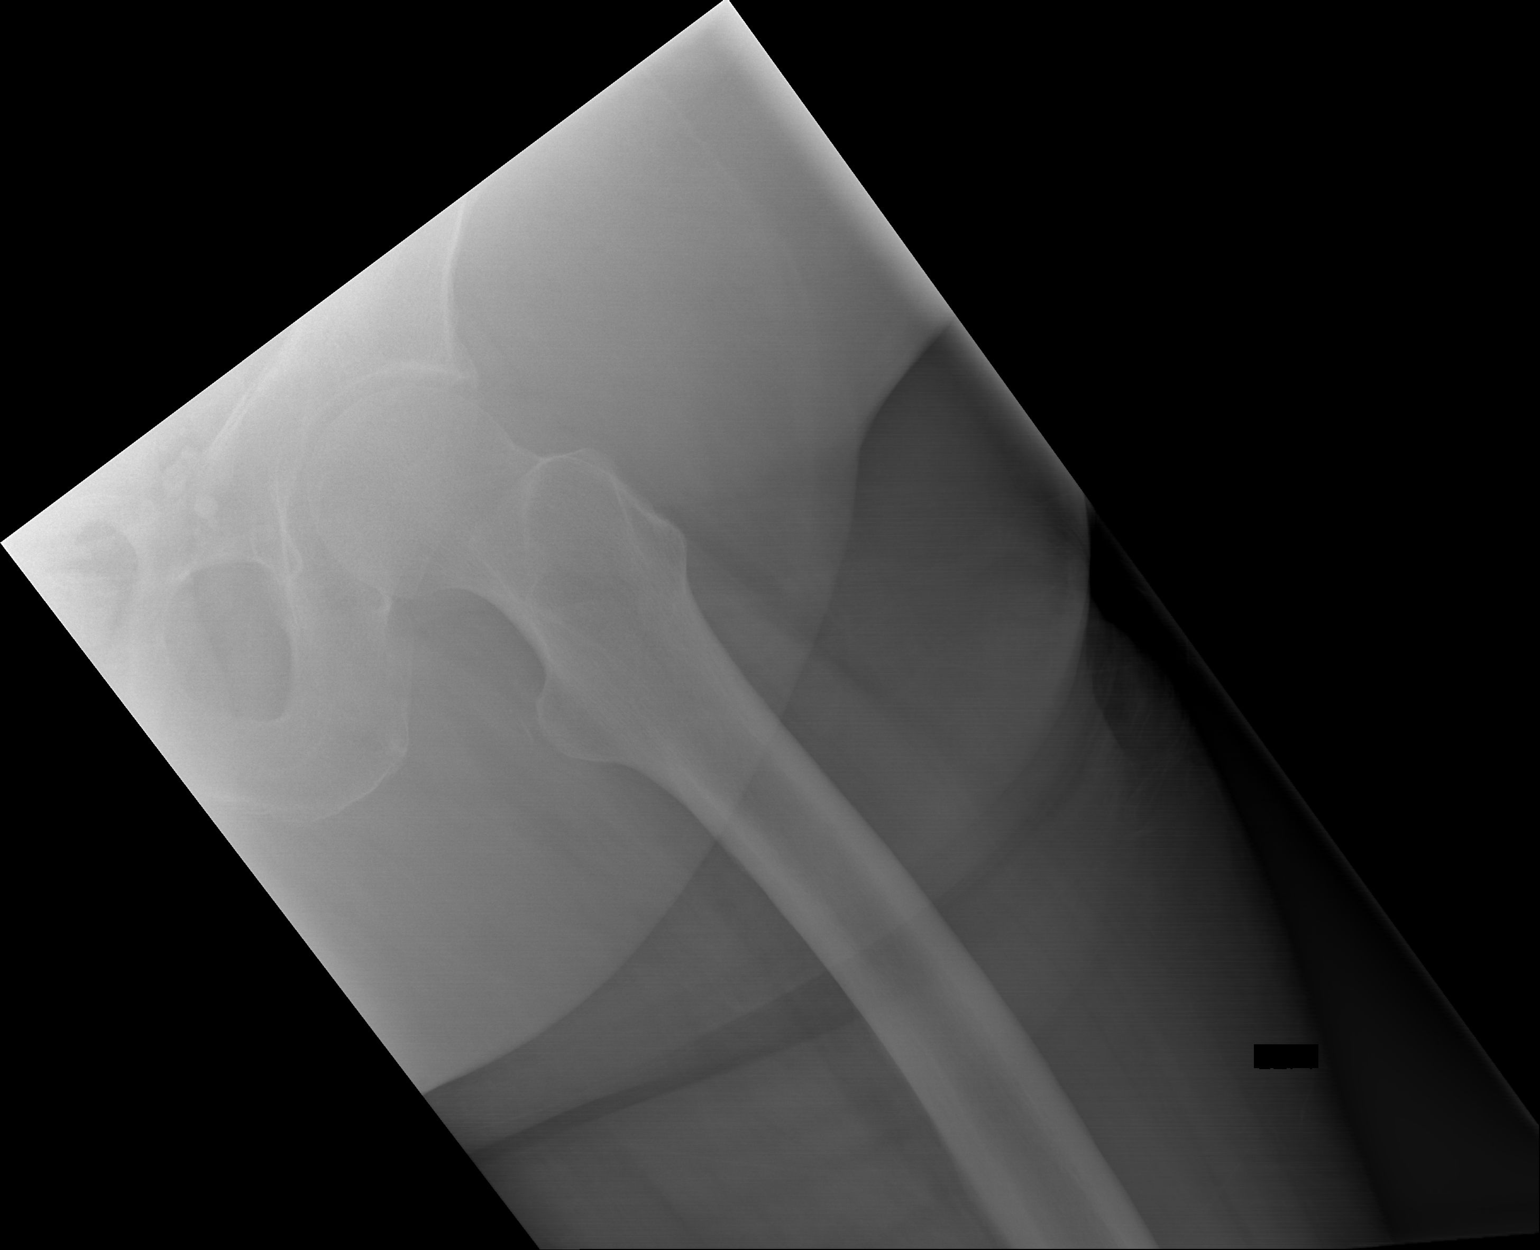

[3 of 3 positions shown; findings below may reference images not displayed]

FINDINGS: No acute fracture or hip dislocation is identified. There is
unchanged mild hip joint space narrowing bilaterally. Pelvic
calcifications are unchanged and likely represent phleboliths.
IMPRESSION: No acute osseous abnormality identified.

## 2020-08-01 MED ORDER — HYDROCODONE-ACETAMINOPHEN 5-325 MG PO TABS: 1.0000 | ORAL_TABLET | ORAL | 0 refills | Status: DC | PRN

## 2020-08-01 MED ORDER — HYDROCODONE-ACETAMINOPHEN 5-325 MG PO TABS
1.0000 | ORAL_TABLET | Freq: Once | ORAL | Status: AC
  Administered 2020-08-01: 1 via ORAL
  Filled 2020-08-01: qty 1

## 2020-08-01 NOTE — ED Provider Notes (Signed)
[facility] DEPT Provider Note   CSN: [id number] Arrival date & time: 08/01/20  0245   History Chief Complaint  Patient presents with  . Fall    Tyler Brewer is a 59 y.o. male.  The history is provided by the patient.  Fall  He has history of hypertension, diabetes, hyperlipidemia, coronary artery disease, drug abuse and comes in after falling.  He has been having ongoing problems with pain in his left hip which she rates at 8/10 at baseline.  It is keeping him from sleeping.  Tonight, his left leg gave out on him and he fell and pain in his left hip increased to 10/10.  He has had several other falls where his left leg gave out on him.  Of note, he has been seeing an orthopedic physician and did get an injection in the left hip 1 week ago, but only got very temporary relief of pain.  Past Medical History:  Diagnosis Date  . Arthritis   . CAD (coronary artery disease)  cardiac cath with moderate disease in a septal branch of the ramus intermedius 04/01/2012  . Depression   . Diabetes mellitus    poorly controlled by his report  . History of narcotic addiction ([place])    past history of back pain  . Hypercholesteremia   . Hypertension   . IBS (irritable bowel syndrome)   . 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[place])   . Neuropathy   . Obesity    Max weight was 390  . OSA on CPAP   . Panic attacks   . Testosterone deficiency   . Vertigo     Patient Active Problem List   Diagnosis Date Noted  . Acute left-sided low back pain with right-sided sciatica 07/19/2020  . Hip pain, acute, left 07/05/2020  . Tremor 07/05/2020  . Weakness 07/05/2020  . Balance problem 03/11/2020  . Tinea cruris 03/11/2020  . Cellulitis of left groin 03/11/2020  . Acute pain of right shoulder 03/11/2020  . Interstitial lung disease ([place]) 05/02/2017  . Pansinusitis 09/26/2016  . Diabetic polyneuropathy associated with diabetes mellitus due to underlying condition ([place])  05/08/2016  . Methamphetamine use disorder, severe, dependence ([place]) 02/11/2016  . Substance induced mood disorder ([place]) 02/11/2016  . Exertional dyspnea 11/30/2015  . ADD (attention deficit disorder) 09/29/2015  . Binge eating 09/29/2015  . Syncope 05/05/2012  . Diarrhea 05/05/2012  . Panic attacks 05/05/2012  . OSA (obstructive sleep apnea) 05/05/2012  . HTN (hypertension) 05/05/2012  . Diabetes mellitus, type II ([place]) 05/05/2012  . Dyslipidemia 05/05/2012  . CAD (coronary artery disease)  cardiac cath with moderate disease in a septal branch of the ramus intermedius 04/01/2012  . Chest pain 04/01/2012  . Drug abuse and dependence ([place]) 04/01/2012  . Family history of coronary artery disease 03/31/2012  . Sleep apnea, on C-pap 03/31/2012  . Hyperlipemia 03/30/2012  . HTN (hypertension), benign 03/30/2012  . Morbid obesity ([place]) 03/30/2012  . DM type 2, uncontrolled, with neuropathy ([place]) 03/30/2012  . Depression with suicidal ideation 03/30/2012    Past Surgical History:  Procedure Laterality Date  . CARDIAC CATHETERIZATION    . LEFT HEART CATHETERIZATION WITH CORONARY ANGIOGRAM N/A 03/31/2012   Procedure: LEFT HEART CATHETERIZATION WITH CORONARY ANGIOGRAM;  Surgeon: Leonie Man, MD;  Location: [facility] CATH LAB;  Service: Cardiovascular;  Laterality: N/A;       Family History  Problem Relation Age of Onset  . Diabetes type II Father   . Hypertension Father   .  Pancreatic disease Father 75       Deceased  . Healthy Mother   . Healthy Sister   . Healthy Son   . Healthy Daughter   . Parkinson's disease Maternal Grandmother     Social History   Tobacco Use  . Smoking status: Current Every Day Smoker    Packs/day: 0.50    Types: Cigarettes  . Smokeless tobacco: Never Used  Vaping Use  . Vaping Use: Never used  Substance Use Topics  . Alcohol use: No    Alcohol/week: 0.0 standard drinks  . Drug use: Not Currently    Types: Methamphetamines    Home  Medications Prior to Admission medications   Medication Sig Start Date End Date Taking? Authorizing Provider  acetaminophen (TYLENOL) 325 MG tablet Take 650 mg by mouth every 6 (six) hours as needed for mild pain, fever or headache.    [provider]  DULoxetine (CYMBALTA) 30 MG capsule Take 1 capsule (30 mg total) by mouth daily. 07/19/20   [name], DO  gabapentin (NEURONTIN) 300 MG capsule Take 2 tablets in the morning, 1 tablet in the afternoon, and 1 tablet at bedtime. Patient taking differently: Take 300-600 mg by mouth See admin instructions. 600mg  in the morning 300mg  in the afternoon 300mg  at bedtime 04/29/20   Narda Amber K, DO  insulin aspart (NOVOLOG) 100 UNIT/ML injection Inject 10 Units into the skin 3 (three) times daily with meals. 07/19/20   [name] R, DO  insulin degludec (TRESIBA FLEXTOUCH) 100 UNIT/ML FlexTouch Pen Inject 10 Units into the skin daily. Patient taking differently: Inject 20 Units into the skin daily.  07/05/20   [name], DO  losartan (COZAAR) 100 MG tablet Take 1 tablet (100 mg total) by mouth daily. 07/05/20   [name], DO  meclizine (ANTIVERT) 25 MG tablet Take 1 tablet (25 mg total) by mouth 3 (three) times daily as needed for up to 7 days for dizziness. 07/27/20 08/03/20  Wyvonnia Dusky, MD  meloxicam (MOBIC) 7.5 MG tablet Take 1 tablet (7.5 mg total) by mouth 2 (two) times daily as needed for pain. 07/28/20   Leandrew Koyanagi, MD  naproxen sodium (ALEVE) 220 MG tablet Take 440-660 mg by mouth 2 (two) times daily as needed (pain/headache).    [provider]  pravastatin (PRAVACHOL) 40 MG tablet Take 1 tablet (40 mg total) by mouth daily. 03/10/20   [name], DO  tamsulosin (FLOMAX) 0.4 MG CAPS capsule Take 1 capsule (0.4 mg total) by mouth daily. 03/10/20   [name], DO    Allergies    Gabapentin  Review of Systems   Review of Systems  All other systems  reviewed and are negative.   Physical Exam Updated Vital Signs BP (!) 168/104 (BP Location: Left Arm)   Pulse 88   Temp 98.4 F (36.9 C) (Oral)   Resp 18   Ht 5\' 11"  (1.803 m)   Wt 131.1 kg   SpO2 94%   BMI 40.31 kg/m   Physical Exam Vitals and nursing note reviewed.   59 year old male, resting comfortably and in no acute distress. Vital signs are significant for elevated blood pressure. Oxygen saturation is 94%, which is normal. Head is normocephalic and atraumatic. PERRLA, EOMI. Oropharynx is clear. Neck is nontender and supple without adenopathy or JVD. Back is moderately tender in the left paralumbar area with little midline tenderness.  Straight leg  raise is positive on the left at [place].  There is no CVA tenderness. Lungs are clear without rales, wheezes, or rhonchi. Chest is nontender. Heart has regular rate and rhythm without murmur. Abdomen is soft, flat, nontender without masses or hepatosplenomegaly and peristalsis is normoactive. Extremities have no cyanosis or edema, full range of motion is present.  There is tenderness to palpation rather diffusely around the left hip, but there is little pain elicited with passive range of motion. Skin is warm and dry without rash. Neurologic: Mental status is normal, cranial nerves are intact.  Motor strength is 5/5 in all extremities.  There is decreased sensation in the left foot compared with the right.  ED Results / Procedures / Treatments    Radiology DG Hip Unilat W or Wo Pelvis 2-3 Views Left  Result Date: 08/01/2020 CLINICAL DATA:  Fall.  Left hip pain. EXAM: DG HIP (WITH OR WITHOUT PELVIS) 2-3V LEFT COMPARISON:  06/23/2020 FINDINGS: No acute fracture or hip dislocation is identified. There is unchanged mild hip joint space narrowing bilaterally. Pelvic calcifications are unchanged and likely represent phleboliths. IMPRESSION: No acute osseous abnormality identified. Electronically Signed   By: Logan Bores M.D.   On:  08/01/2020 04:08    Procedures Procedures   Medications Ordered in ED Medications  HYDROcodone-acetaminophen (NORCO/VICODIN) 5-325 MG per tablet 1 tablet (has no administration in time range)    ED Course  I have reviewed the triage vital signs and the nursing notes.  Pertinent labs & imaging results that were available during my care of the patient were reviewed by me and considered in my medical decision making (see chart for details).  MDM Rules/Calculators/A&P Fall with left hip pain.  Based on my exam, I feel symptoms are far more likely to be radicular from either spinal stenosis or lumbar radiculopathy then from primary hip arthritis.  Old records are reviewed, and hip x-rays have shown only mild degenerative changes.  Office note from his orthopedic physician on 12/7 mentions that they want him to follow-up if he did not get good relief with his hip injection, wish to pursue evaluation of lumbar spine.  Today, we will get x-rays of left hip to make sure he is not fractured and with his fall, anticipate referral back to orthopedic physician for ongoing outpatient work-up.  X-rays show minimal degenerative changes, no fracture.  He is discharged with a prescription for small number of hydrocodone-acetaminophen tablets, urged to follow-up with his orthopedic physician.  Final Clinical Impression(s) / ED Diagnoses Final diagnoses:  Fall, initial encounter  Chronic hip pain, left  Lumbar radiculopathy  Elevated blood pressure reading with diagnosis of hypertension    Rx / DC Orders ED Discharge Orders         Ordered    HYDROcodone-acetaminophen (NORCO) 5-325 MG tablet  Every 4 hours PRN        08/01/20 4696           Delora Fuel, MD 29/52/84 [phone]

## 2020-08-01 NOTE — Telephone Encounter (Signed)
Called and spoke with patient. He has picked up the Meloxicam and is not improving at all. He would like to proceed with the MRI. Are you okay with that?

## 2020-08-01 NOTE — Telephone Encounter (Signed)
MR arthrogram of hip and MRI of lumbar spine.  Thanks.

## 2020-08-01 NOTE — ED Notes (Signed)
Patient states the his left leg "collapsed" and he fell and his left hip is hurting.  Patient states that he has been seeing his pcp for left hip problems for several months.

## 2020-08-01 NOTE — Telephone Encounter (Signed)
Ordered and patient notified.  

## 2020-08-01 NOTE — ED Triage Notes (Signed)
Patient arrived with complaints of left hip pain after falling on the way to the restroom, no LOC.

## 2020-08-09 ENCOUNTER — Other Ambulatory Visit: Payer: Self-pay | Admitting: Family Medicine

## 2020-08-09 NOTE — Telephone Encounter (Signed)
Medication:HYDROcodone-acetaminophen (NORCO) 5-325 MG tablet [[id number]]       Has the patient contacted their pharmacy?  (If no, request that the patient contact the pharmacy for the refill.) (If yes, when and what did the pharmacy advise?)     Preferred Pharmacy (with phone number or street name): [facility] [address] - [place], [place] - [address] [place] [address]  [address], [place] [address]  Phone:  [phone] Fax:  [phone]     Agent: Please be advised that RX refills may take up to 3 business days. We ask that you follow-up with your pharmacy.

## 2020-08-09 NOTE — Telephone Encounter (Signed)
Requesting: NORCO Contract: N/A UDS: N/A Last OV: 07/19/20 Next OV: N/A Last Refill: 08/01/2020, #10--0 RF by Dr. Delora Fuel Database:   Please advise

## 2020-08-10 ENCOUNTER — Encounter: Payer: Self-pay | Admitting: Family Medicine

## 2020-08-15 ENCOUNTER — Telehealth: Payer: Self-pay | Admitting: Family Medicine

## 2020-08-15 ENCOUNTER — Telehealth: Payer: Self-pay | Admitting: Orthopaedic Surgery

## 2020-08-15 MED ORDER — METHOCARBAMOL 500 MG PO TABS: 500.0000 mg | ORAL_TABLET | Freq: Two times a day (BID) | ORAL | 0 refills | Status: DC | PRN

## 2020-08-15 MED ORDER — TRAMADOL HCL 50 MG PO TABS
ORAL_TABLET | ORAL | 0 refills | Status: DC
Start: 2020-08-15 — End: 2020-09-09

## 2020-08-15 NOTE — Telephone Encounter (Signed)
I called patient. No history of seizures. Tramadol called to pharmacy. Robaxin sent in by [place].

## 2020-08-15 NOTE — Telephone Encounter (Signed)
Can you call in tramadol 50mg  1-2 every 8 hours prn pain if no hx of seizures and can you also call in robaxin 500mg  twice daily prn #20

## 2020-08-15 NOTE — Telephone Encounter (Signed)
Sent to pharmacy 

## 2020-08-15 NOTE — Addendum Note (Signed)
Addended by: Barbette Or on: 08/15/2020 03:36 PM   Modules accepted: Orders

## 2020-08-15 NOTE — Addendum Note (Signed)
Addended by: Rogers Seeds on: 08/15/2020 05:00 PM   Modules accepted: Orders

## 2020-08-15 NOTE — Telephone Encounter (Signed)
Pt called and he's in a lot of pain and was having muscle spasms in his leg and he can barley move it. He was wondering if you could give him something to help with the pain.

## 2020-08-15 NOTE — Telephone Encounter (Signed)
Tyler Oddi do you have a min to contact pt to discuss and call and tramadol?

## 2020-08-15 NOTE — Telephone Encounter (Signed)
Patient states tresiba pen broke. He has to send it back to the manufacture. He does not have money to get another one and is wondering if there is any sample.

## 2020-08-16 ENCOUNTER — Other Ambulatory Visit: Payer: Medicare HMO

## 2020-08-16 NOTE — Telephone Encounter (Signed)
Yes, that'd be ideal to get him a sample if reps are able to bring one.

## 2020-08-16 NOTE — Telephone Encounter (Signed)
Called rep to order some samples left VM to return call.

## 2020-08-16 NOTE — Telephone Encounter (Signed)
Called patient and left VM

## 2020-08-17 ENCOUNTER — Ambulatory Visit
Admission: RE | Admit: 2020-08-17 | Discharge: 2020-08-17 | Disposition: A | Discharge status: Home / Self Care | Payer: Medicare HMO | Source: Ambulatory Visit | Attending: Orthopaedic Surgery | Admitting: Orthopaedic Surgery

## 2020-08-17 DIAGNOSIS — M5416 Radiculopathy, lumbar region: Secondary | ICD-10-CM

## 2020-08-17 DIAGNOSIS — M25552 Pain in left hip: Secondary | ICD-10-CM

## 2020-08-17 DIAGNOSIS — M48061 Spinal stenosis, lumbar region without neurogenic claudication: Secondary | ICD-10-CM | POA: Diagnosis not present

## 2020-08-17 DIAGNOSIS — M545 Low back pain, unspecified: Secondary | ICD-10-CM | POA: Diagnosis not present

## 2020-08-17 IMAGING — MR MR HIP*L* W/CM
4 of 6 series · 17 of 40 positions shown · IV contrast (agent unspecified)
Comparison: None.

CLINICAL DATA: Chronic hip pain

EXAM:
MRI OF THE LEFT HIP WITH CONTRAST
TECHNIQUE: Multiplanar, multisequence MR imaging was performed following the
administration of intra-articular contrast.
CONTRAST:  Intra-articular contrast

[Series 3: T1 · coronal · 4.0mm · 0.53mm/px · 6 of 24 slices shown]
[im 1/24]
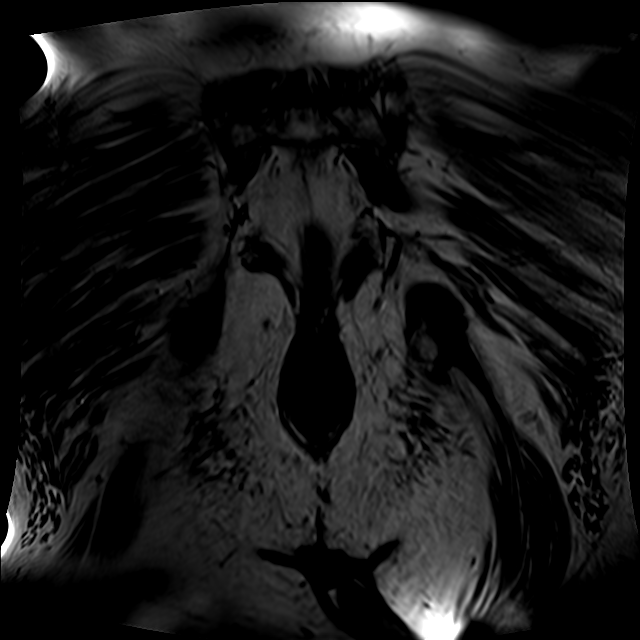
[im 5/24]
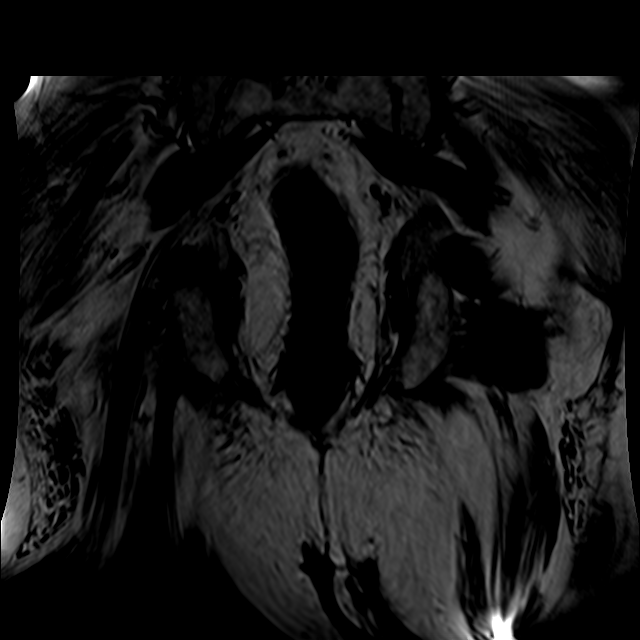
[im 10/24]
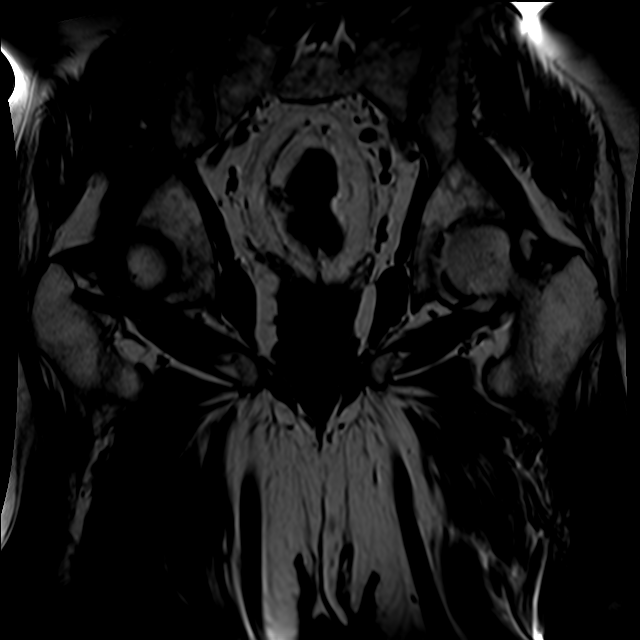
[im 14/24]
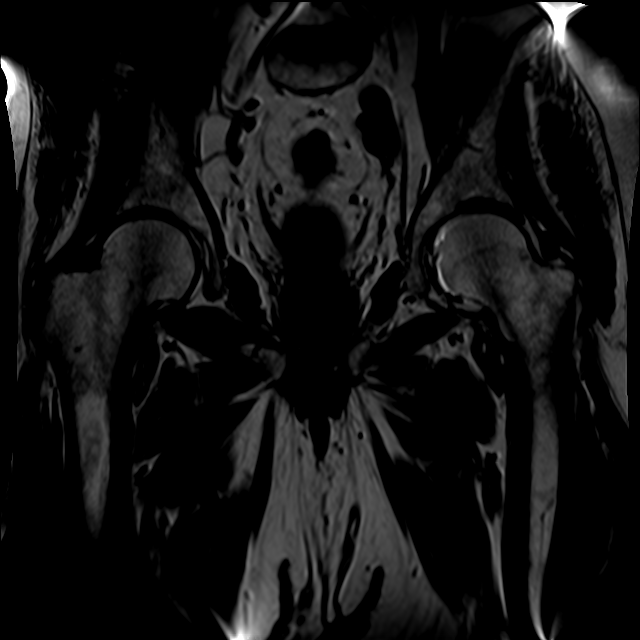
[im 19/24]
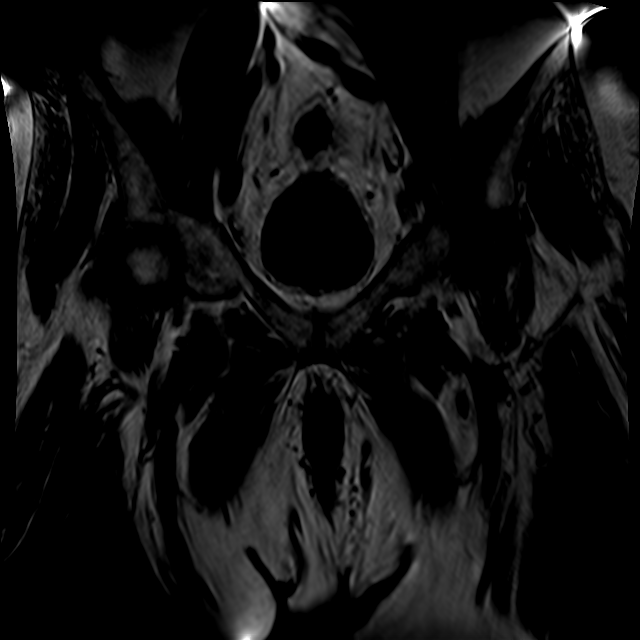
[im 24/24]
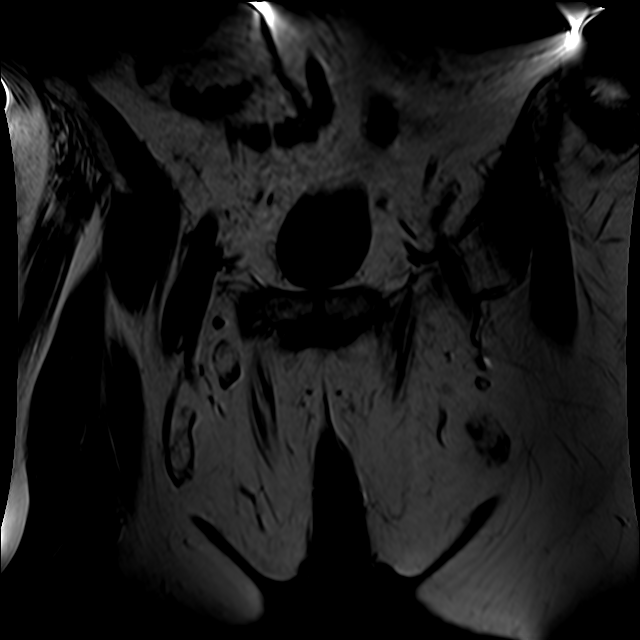

[Series 4: T2 fat-sat · coronal · 4.0mm · 0.53mm/px · 5 of 24 slices shown (1 of 2)]
[im 1/24]
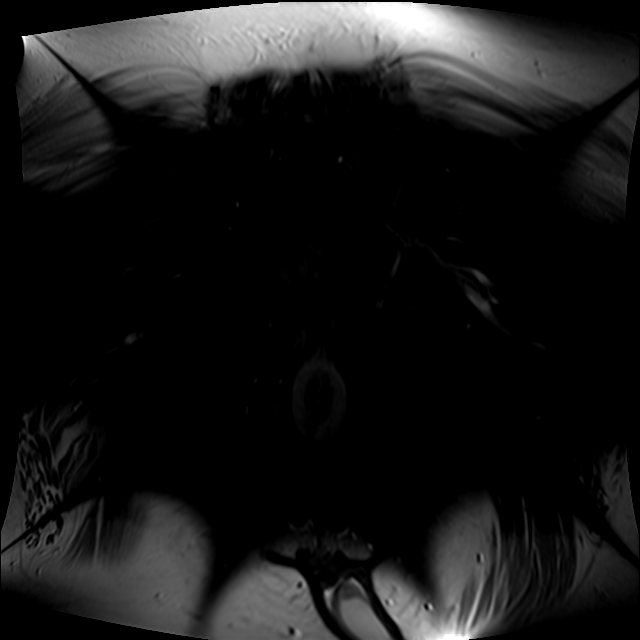
[im 4/24]
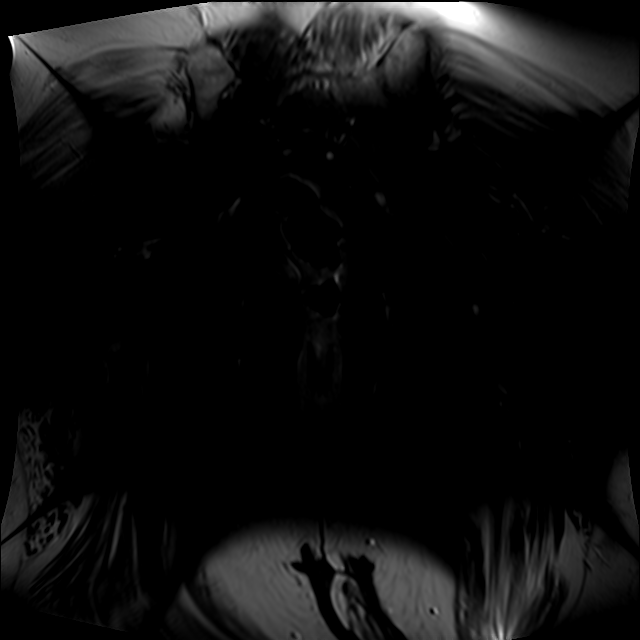
[im 8/24]
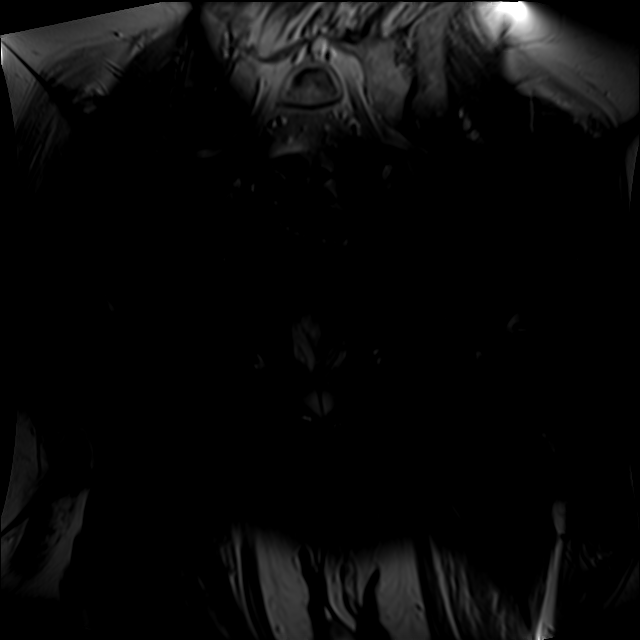
[im 12/24]
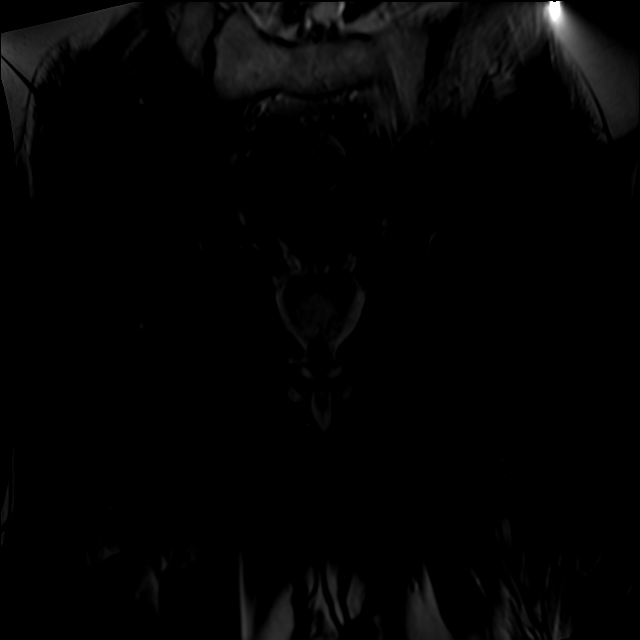
[im 20/24]
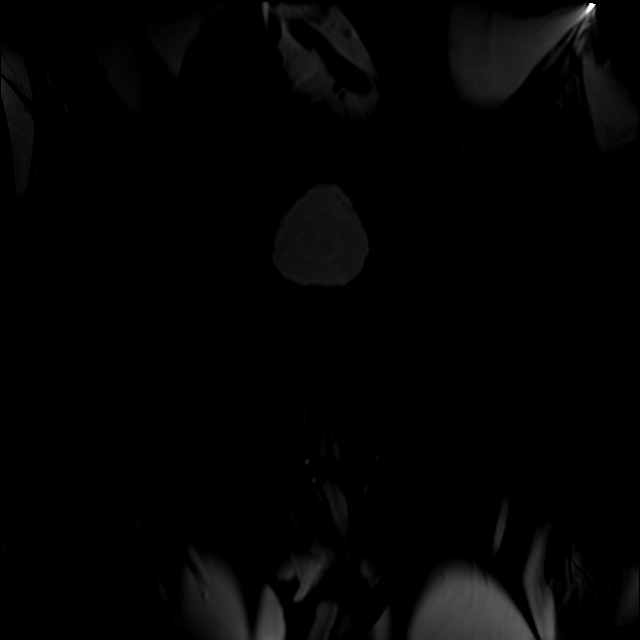

[Series 5: T2 fat-sat · axial · 4.0mm · 0.70mm/px · z∈[-1,+99]mm · 3 of 29 slices shown (2 of 2)]
[im 5/29]
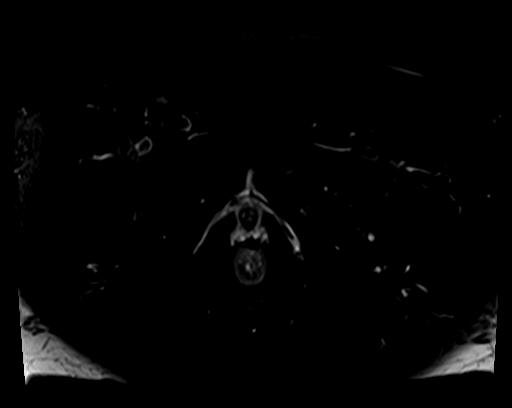
[im 17/29]
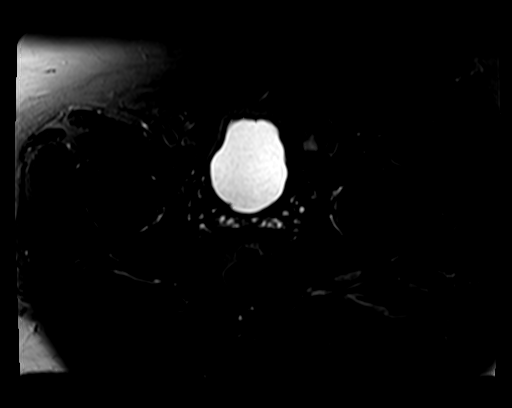
[im 25/29]
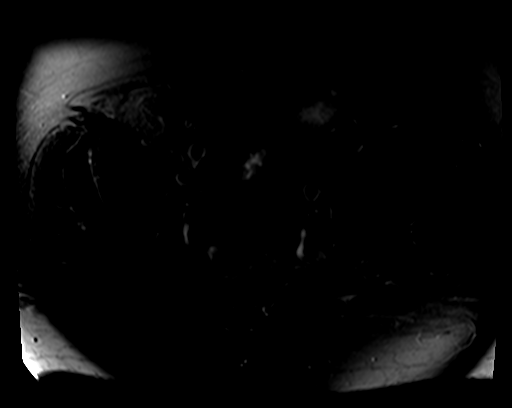

[Series 7: T1 fat-sat · axial · 4.0mm · 0.70mm/px · z∈[+70,+142]mm · 3 of 22 slices shown]
[im 5/22]
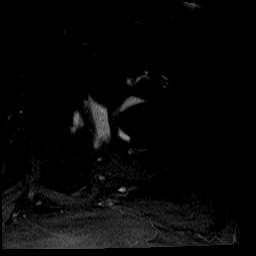
[im 13/22]
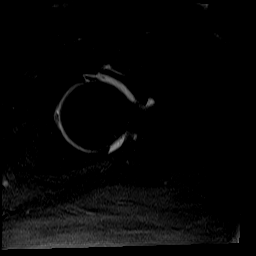
[im 22/22]
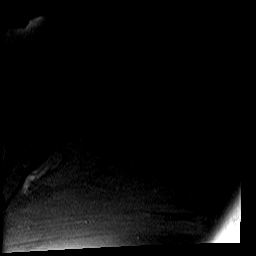

[17 of 40 positions shown; findings below may reference images not displayed]

FINDINGS: Bones: There is no evidence of acute fracture, dislocation or
avascular necrosis. The femoral head is normal in shape,
configuration and sphericity. No osseous bump at the femoral head
neck junction. The visualized bony pelvis appears normal. The
visualized sacroiliac joints and symphysis pubis appear normal.

Articular cartilage and labrum

Articular cartilage: Mild chondral thinning seen within the superior
joint space. The ligamentum teres is intact

Labrum: Fraying of the anterior superior labrum is seen. No
displaced labral tear is noted.

Joint or bursal effusion

Joint effusion: No significant hip joint effusion.

Bursae: No focal periarticular fluid collection.

Muscles and tendons

Muscles and tendons: The muscles surrounding hip are normal in
appearance without evidence of tear or atrophy. The visualized
gluteus, hamstring and iliopsoas tendons appear normal. The
piriformis muscles appear symmetric.

Other findings

Miscellaneous: The visualized internal pelvic contents appear
unremarkable.
IMPRESSION: Mild femoroacetabular joint osteoarthritis and anterior superior
labral fraying

## 2020-08-17 IMAGING — XA DG FLUORO GUIDE NDL PLC/BX
2 series · 2 of 2 positions shown · non-contrast
Comparison: none

CLINICAL DATA: Left hip pain.

[Series 1: ortho standard · 1 of 1 slices shown (1 of 2)]
[im 1/1]
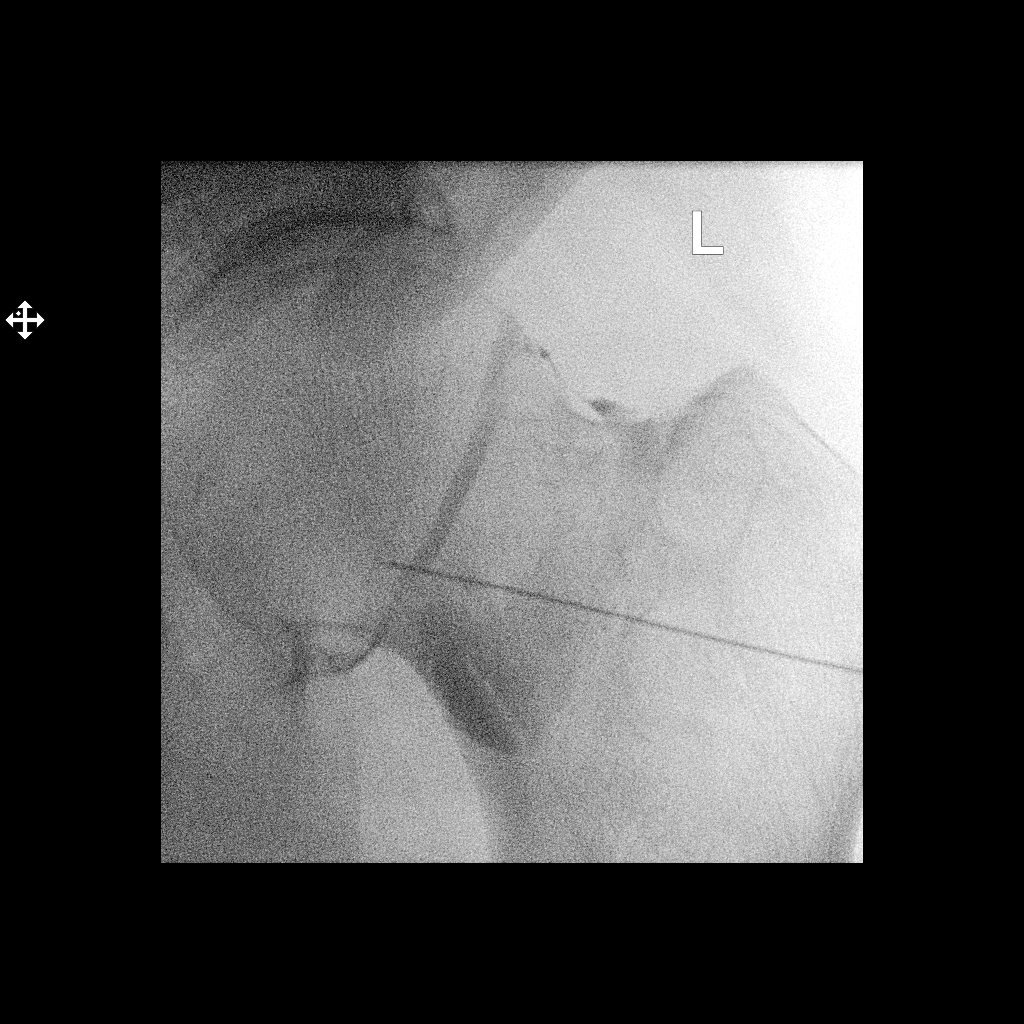

[Series 2: ortho standard · 1 of 1 slices shown (2 of 2)]
[im 1/1]
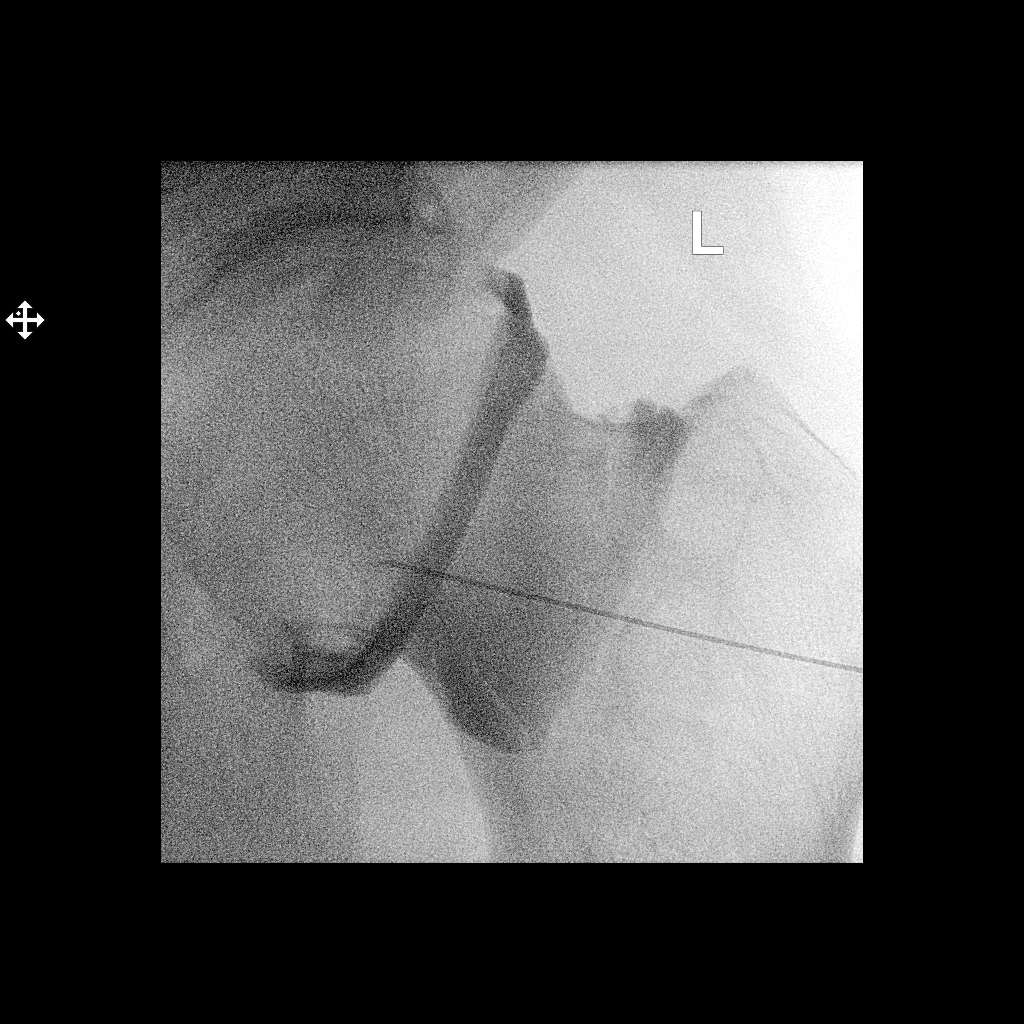

[2 of 2 positions shown; findings below may reference images not displayed]

FLUOROSCOPY TIME:  Radiation Exposure Index (as provided by the
fluoroscopic device): 23.83 uGy*m2

PROCEDURE:
Left HIP INJECTION UNDER FLUOROSCOPY

An appropriate skin entrance site was determined. The site was
marked, prepped with Betadine, draped in the usual sterile fashion,
and infiltrated locally with 1% Lidocaine. A 22 gauge spinal needle
was advanced to the superolateral margin of the femoral head under
intermittent fluoroscopy. 1 ml of Lidocaine injected easily. A
mixture of 0.1 ml Multihance in 20 ml of dilute Isovue M 200 was
then used to opacify the left hip joint. No immediate complication.
IMPRESSION: Technically successful left hip injection for MRI.

## 2020-08-17 IMAGING — MR MR LUMBAR SPINE W/O CM
4 of 5 series · 26 of 48 positions shown · non-contrast
Comparison: CT of the abdomen [DATE] and CT of the
abdomen [DATE] .

CLINICAL DATA: Low back pain.

EXAM:
MRI LUMBAR SPINE WITHOUT CONTRAST
TECHNIQUE: Multiplanar, multisequence MR imaging of the lumbar spine was
performed. No intravenous contrast was administered.

[Series 3: T2 post-contrast · sagittal · 4.0mm · 0.59mm/px · 6 of 16 slices shown]
[im 1/16]
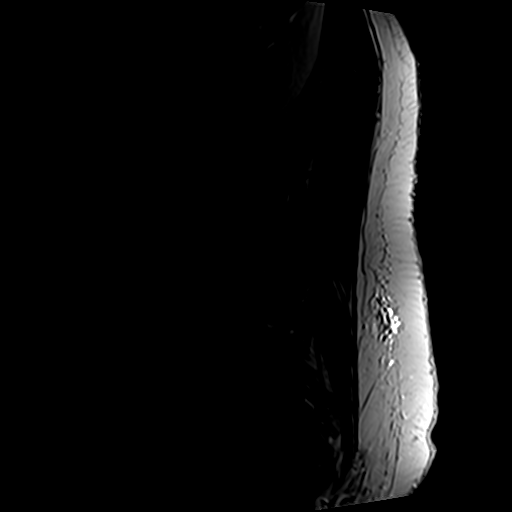
[im 4/16]
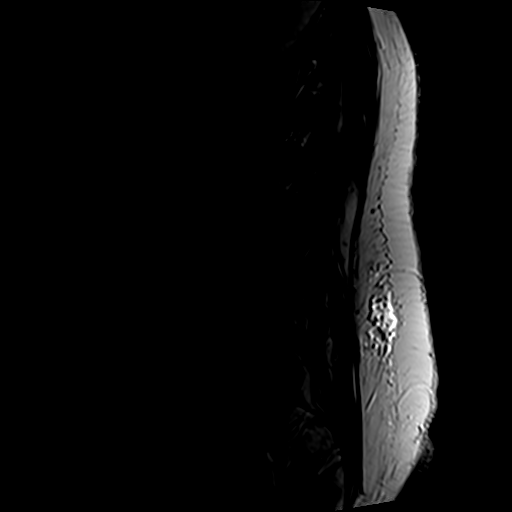
[im 7/16]
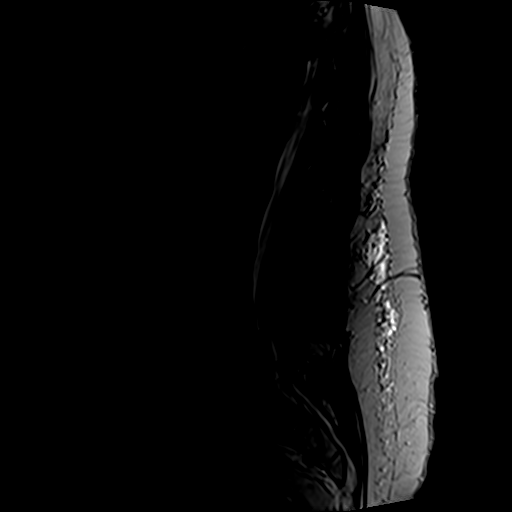
[im 10/16]
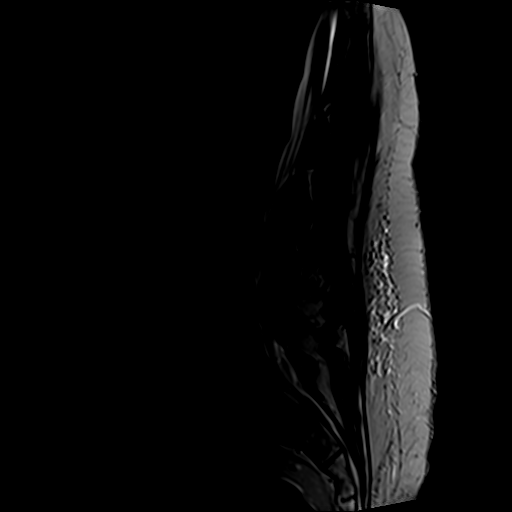
[im 13/16]
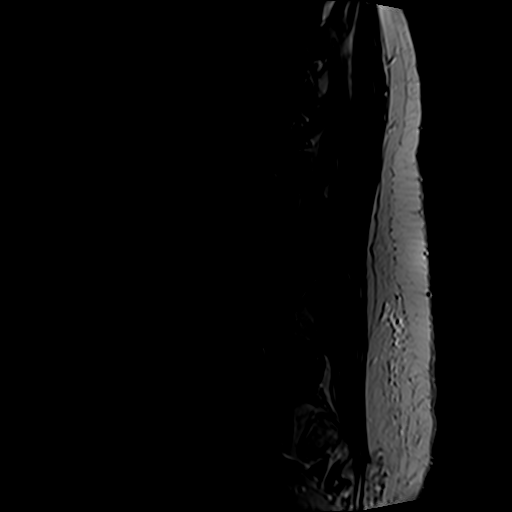
[im 16/16]
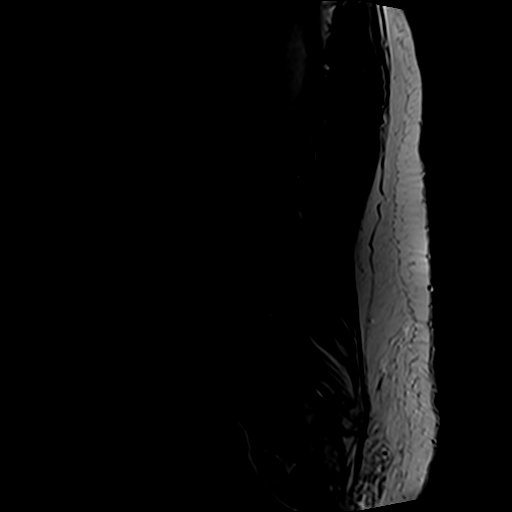

[Series 5: T1 · sagittal · 4.0mm · 0.59mm/px · 6 of 16 slices shown (1 of 2)]
[im 1/16]
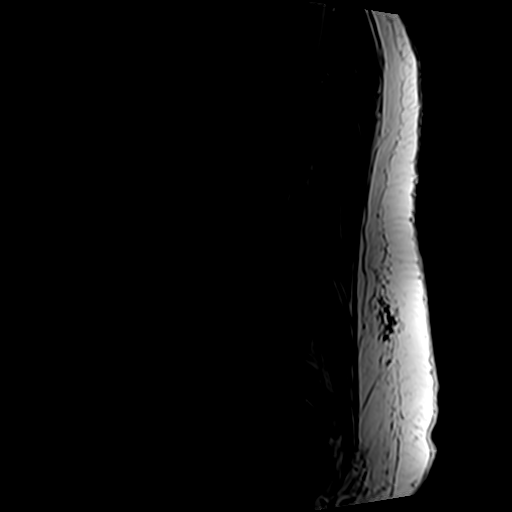
[im 4/16]
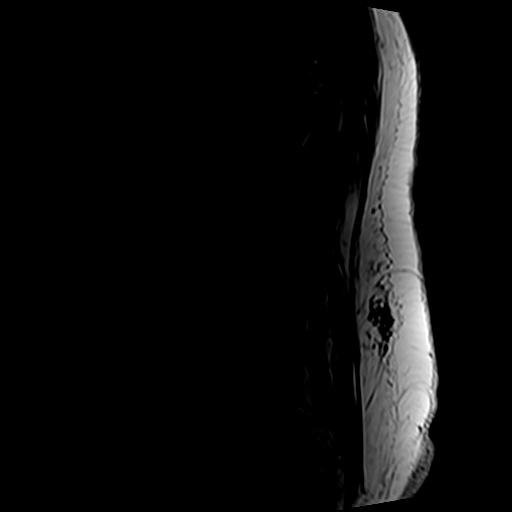
[im 7/16]
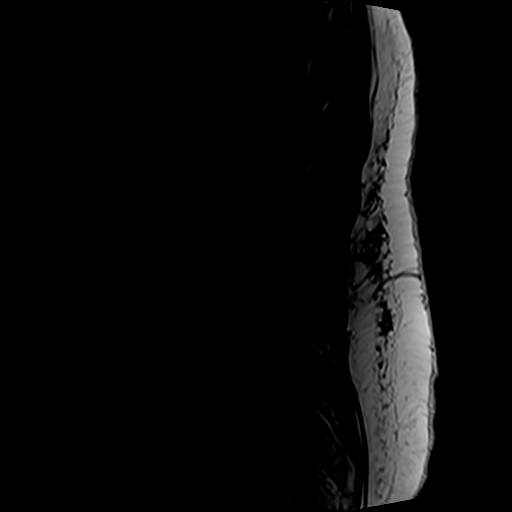
[im 10/16]
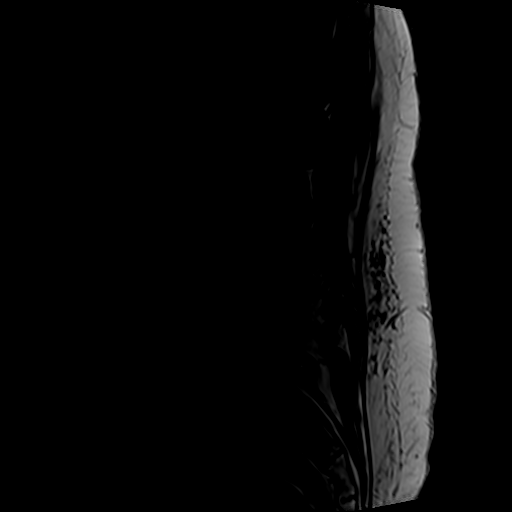
[im 13/16]
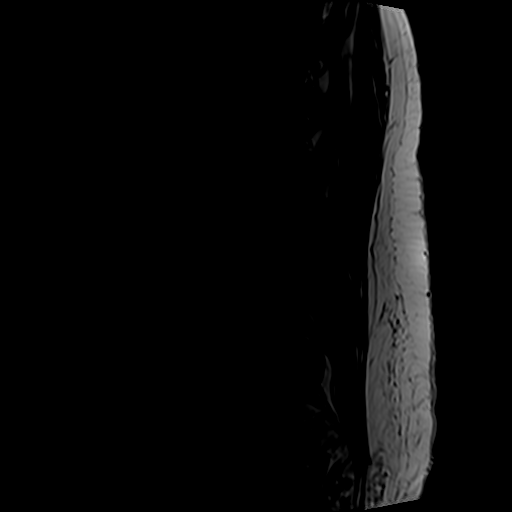
[im 16/16]
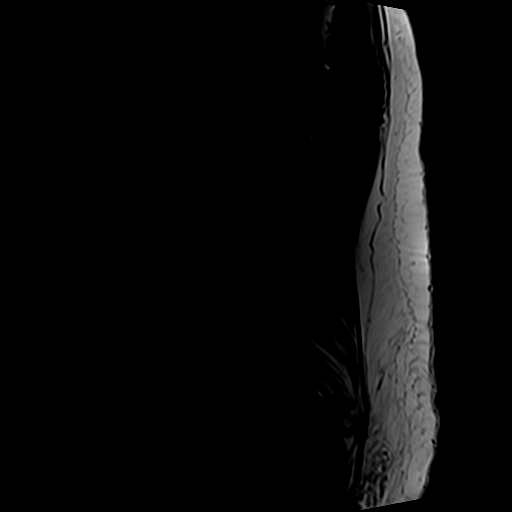

[Series 6: T2 · axial · 4.0mm · 0.78mm/px · z∈[+4,+231]mm · 9 of 39 slices shown]
[im 1/39]
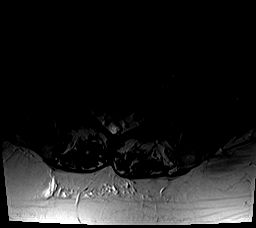
[im 6/39]
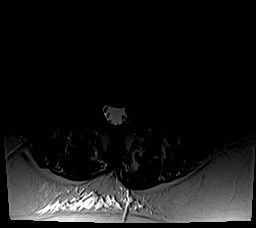
[im 11/39]
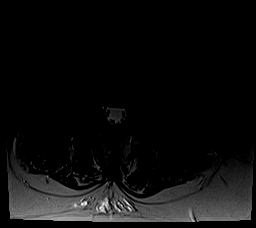
[im 17/39]
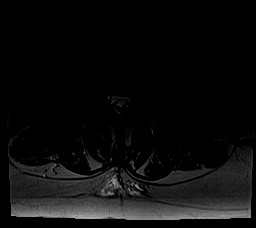
[im 20/39]
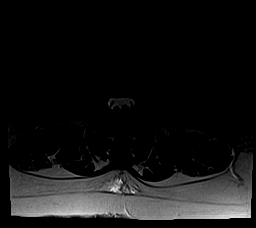
[im 22/39]
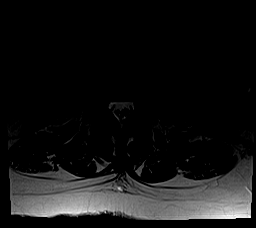
[im 28/39]
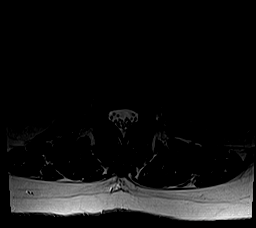
[im 33/39]
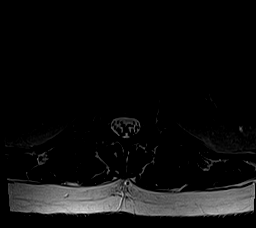
[im 39/39]
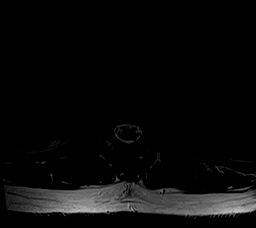

[Series 7: T1 · axial · 4.0mm · 0.39mm/px · z∈[+4,+200]mm · 5 of 39 slices shown (2 of 2)]
[im 1/39]
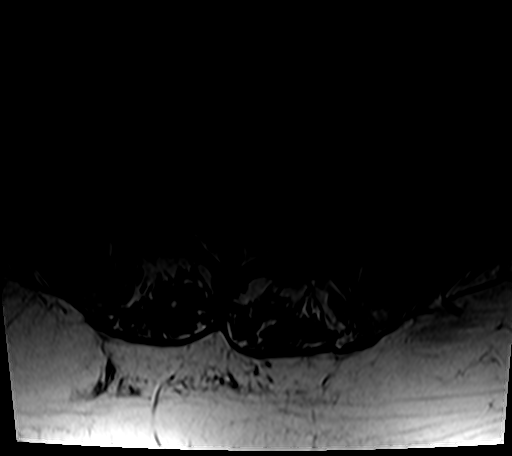
[im 6/39]
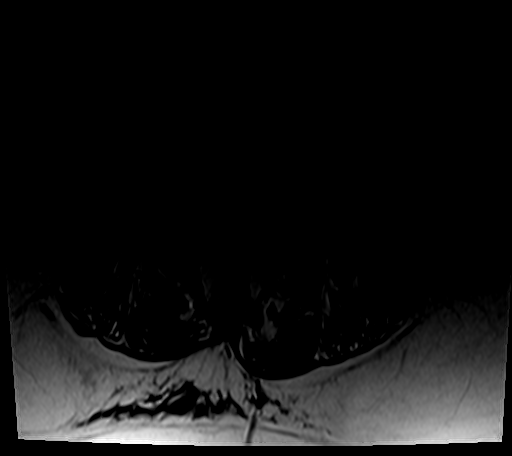
[im 11/39]
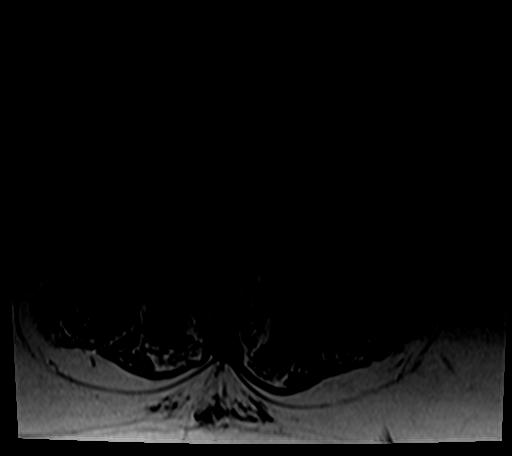
[im 20/39]
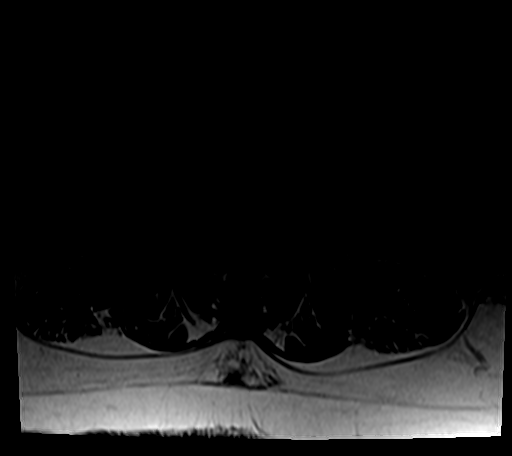
[im 33/39]
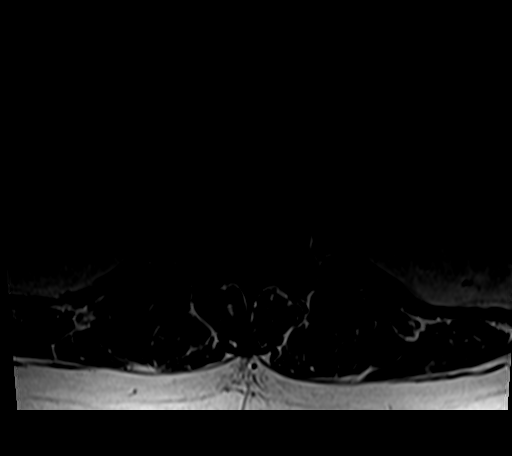

[26 of 48 positions shown; findings below may reference images not displayed]

FINDINGS: Segmentation:  Standard.

Alignment:  Trace retrolisthesis at L2-3.

Vertebrae: No fracture, evidence of discitis, or aggressive bone
lesion. T1 hypointense, T2 hyperintense lesion within the L3
vertebral body corresponds to stable lesion seen on prior CT of the
abdomen performed in [DATE] and [DATE] and likely
represents an atypical hemangioma.

Conus medullaris and cauda equina: Conus extends to the L1 level.
Conus and cauda equina appear normal.

Paraspinal and other soft tissues: Negative.

Disc levels:

T12-L1: No spinal canal or neural foraminal stenosis.

L1-2: Shallow disc bulge without significant spinal canal or neural
foraminal stenosis.

L2-3: Shallow disc bulge with superimposed small left far lateral
disc protrusion and mild facet degenerative changes resulting in
mild bilateral neural foraminal narrowing.

L3-4: Shallow disc bulge and mild facet degenerative changes without
significant spinal canal or neural foraminal stenosis.

L4-5: Shallow disc bulge and mild facet degenerative changes without
significant spinal canal or neural foraminal stenosis.

L5-S1: Tiny central disc, moderate right and advanced hypertrophic
left facet degenerative changes without significant spinal canal or
neural foraminal stenosis.
IMPRESSION: 1. Mild lumbar spondylosis with mild bilateral neural foraminal
narrowing at L2-3.
2. Moderate right and advanced left facet arthrosis at L5-S1.
3. No significant spinal canal stenosis.

## 2020-08-17 MED ORDER — IOPAMIDOL (ISOVUE-M 200) INJECTION 41%
15.0000 mL | Freq: Once | INTRAMUSCULAR | Status: AC
  Administered 2020-08-17: 15 mL via INTRA_ARTICULAR

## 2020-08-18 ENCOUNTER — Telehealth: Payer: Self-pay

## 2020-08-18 ENCOUNTER — Encounter: Payer: Self-pay | Admitting: Internal Medicine

## 2020-08-18 ENCOUNTER — Other Ambulatory Visit: Payer: Self-pay | Admitting: Physician Assistant

## 2020-08-18 MED ORDER — HYDROCODONE-ACETAMINOPHEN 5-325 MG PO TABS: 1.0000 | ORAL_TABLET | Freq: Two times a day (BID) | ORAL | 0 refills | Status: DC | PRN

## 2020-08-18 NOTE — Telephone Encounter (Signed)
I spoke with the pt and scheduled MRI f/u on 08/30/20. Pt wanted to know if norco 5mg  can be called in for him again. He stated the tramadol isnt helping and he is in so much pain he can hardly get out of bed. Please advise

## 2020-08-18 NOTE — Telephone Encounter (Signed)
Pt called and advised 

## 2020-08-18 NOTE — Telephone Encounter (Signed)
Sent in one small rx

## 2020-08-18 NOTE — Progress Notes (Signed)
Needs f/u appt plese.  Thanks.

## 2020-08-18 NOTE — Telephone Encounter (Signed)
Patient called he completed his MRI he stated he needs a sooner appointment to go over results with Dr.Xu [phone]

## 2020-08-18 NOTE — Telephone Encounter (Signed)
Dr.Xu has received MRI results and wants patient to come in for a follow up. I tried to call patient. No answer. Left message for patient to call our office back to schedule follow up appointment.

## 2020-08-25 ENCOUNTER — Other Ambulatory Visit: Payer: Self-pay | Admitting: Family Medicine

## 2020-08-25 ENCOUNTER — Other Ambulatory Visit: Payer: Self-pay | Admitting: Physician Assistant

## 2020-08-26 ENCOUNTER — Other Ambulatory Visit: Payer: Self-pay | Admitting: Physician Assistant

## 2020-08-26 ENCOUNTER — Telehealth: Payer: Self-pay | Admitting: Orthopaedic Surgery

## 2020-08-26 MED ORDER — HYDROCODONE-ACETAMINOPHEN 5-325 MG PO TABS: 1.0000 | ORAL_TABLET | Freq: Two times a day (BID) | ORAL | 0 refills | Status: DC | PRN

## 2020-08-26 NOTE — Telephone Encounter (Signed)
I have sent in one more norco rx (cannot do stronger).  Can add ibuprofen 800mg  tid prn as well.  This is last narcotic refill until he is seen

## 2020-08-26 NOTE — Telephone Encounter (Signed)
Pt would like to know if he could get more pain medication until he can be seen. he is a lot pain.. Wants to know he can get a stronger dose?

## 2020-08-26 NOTE — Telephone Encounter (Signed)
Patient aware of the below message from [place] ?

## 2020-08-26 NOTE — Telephone Encounter (Signed)
Please advise 

## 2020-08-27 ENCOUNTER — Encounter (HOSPITAL_COMMUNITY): Payer: Self-pay

## 2020-08-27 ENCOUNTER — Other Ambulatory Visit: Payer: Self-pay

## 2020-08-27 ENCOUNTER — Other Ambulatory Visit: Payer: Medicare HMO

## 2020-08-27 ENCOUNTER — Emergency Department (HOSPITAL_COMMUNITY)
Admission: EM | Admit: 2020-08-27 | Discharge: 2020-08-27 | Disposition: A | Discharge status: Home / Self Care | Payer: Medicare HMO | Attending: Emergency Medicine | Admitting: Emergency Medicine

## 2020-08-27 DIAGNOSIS — M549 Dorsalgia, unspecified: Secondary | ICD-10-CM | POA: Diagnosis not present

## 2020-08-27 DIAGNOSIS — Z79899 Other long term (current) drug therapy: Secondary | ICD-10-CM | POA: Insufficient documentation

## 2020-08-27 DIAGNOSIS — F17210 Nicotine dependence, cigarettes, uncomplicated: Secondary | ICD-10-CM | POA: Diagnosis not present

## 2020-08-27 DIAGNOSIS — M5442 Lumbago with sciatica, left side: Secondary | ICD-10-CM | POA: Diagnosis not present

## 2020-08-27 DIAGNOSIS — Z794 Long term (current) use of insulin: Secondary | ICD-10-CM | POA: Insufficient documentation

## 2020-08-27 DIAGNOSIS — I10 Essential (primary) hypertension: Secondary | ICD-10-CM | POA: Insufficient documentation

## 2020-08-27 DIAGNOSIS — G8929 Other chronic pain: Secondary | ICD-10-CM | POA: Insufficient documentation

## 2020-08-27 DIAGNOSIS — R52 Pain, unspecified: Secondary | ICD-10-CM | POA: Diagnosis not present

## 2020-08-27 DIAGNOSIS — I2510 Atherosclerotic heart disease of native coronary artery without angina pectoris: Secondary | ICD-10-CM | POA: Insufficient documentation

## 2020-08-27 DIAGNOSIS — E1159 Type 2 diabetes mellitus with other circulatory complications: Secondary | ICD-10-CM | POA: Insufficient documentation

## 2020-08-27 DIAGNOSIS — M5416 Radiculopathy, lumbar region: Secondary | ICD-10-CM | POA: Insufficient documentation

## 2020-08-27 DIAGNOSIS — R69 Illness, unspecified: Secondary | ICD-10-CM | POA: Diagnosis not present

## 2020-08-27 DIAGNOSIS — E1142 Type 2 diabetes mellitus with diabetic polyneuropathy: Secondary | ICD-10-CM | POA: Diagnosis not present

## 2020-08-27 DIAGNOSIS — E1140 Type 2 diabetes mellitus with diabetic neuropathy, unspecified: Secondary | ICD-10-CM | POA: Diagnosis not present

## 2020-08-27 DIAGNOSIS — Z743 Need for continuous supervision: Secondary | ICD-10-CM | POA: Diagnosis not present

## 2020-08-27 MED ORDER — MORPHINE SULFATE (PF) 4 MG/ML IV SOLN
4.0000 mg | Freq: Once | INTRAVENOUS | Status: AC
  Administered 2020-08-27: 4 mg via INTRAMUSCULAR
  Filled 2020-08-27: qty 1

## 2020-08-27 MED ORDER — DICLOFENAC SODIUM 1 % EX GEL: 2.0000 g | Freq: Four times a day (QID) | CUTANEOUS | 0 refills | Status: DC

## 2020-08-27 NOTE — Discharge Instructions (Addendum)
Your exam today is consistent with your known degenerative disc disease with left-sided sciatica.   You have been treated with morphine here in the ED for your pain symptoms. Do not drive. Do not use machinery or power tools. Do not sign legal documents. Do not drink alcohol. Do not take sleeping pills. Do not supervise children by yourself. Do not participate in activities that require climbing or being in high places.  You need to follow-up with your orthopedist for ongoing evaluation and management.  Please go to your appointment on Tuesday, as scheduled.  Please call to confirm if you are not absolutely sure of the time/date of your appointment.  Please proceed to pick up your medications prescribed yesterday by your orthopedist.  Please exercise caution given the new prescriptions for both Vicodin and Robaxin in addition to your tramadol.  They can be very sedating and make you at risk for falls.  Please take the ibuprofen, as directed.  I have also prescribed you Voltaren gel, a topical pain reliever which should provide additional relief.    Return to the ED or seek immediate medical attention should you experience any new or worsening symptoms.

## 2020-08-27 NOTE — ED Notes (Signed)
Asked pt if he had ride home, he informed this RN that he is working on it now.

## 2020-08-27 NOTE — ED Notes (Signed)
Pt in formed that his ride is here

## 2020-08-27 NOTE — ED Provider Notes (Signed)
[facility] DEPT Provider Note   CSN: [id number] Arrival date & time: 08/27/20  1011     History Chief Complaint  Patient presents with  . Back Pain    Tyler Brewer is a 60 y.o. male with PMH significant for HTN, HLD, CAD, and substance use disorder including narcotic addiction who presents to the ED via EMS for left-sided low back pain with left leg radiculopathy.  History was obtained by EMS and patient denies any obvious precipitating injury or trauma.  He reports having taken tramadol earlier this morning, without relief.  I reviewed patient's medical record and he has been evaluated for similar presentations in the past.  He was recently seen 08/01/2020 with complaints of 10 out of 10 left hip pain and left leg radiculopathy.  Plain films obtained at that time were negative for any acute pathology.  He was treated with Vicodin here in the ED and discharged home with continued outpatient follow-up with his orthopedist.  At that time, office note from orthopedic physician on 07/26/2020 encouraged outpatient follow-up if he did have adequate relief with his hip injection.  I reviewed a conversation between patient and [facility] and patient is requesting ongoing outpatient narcotic medications for his pain.  He still has not yet been seen for follow-up and they prescribed him final Norco Rx in addition to 800 mg ibuprofen yesterday a.m.  On my examination, patient is lying on the bed, resting.  He is not crying or any excruciating pain, but complains of significant left leg pain radiating from his low back.  He states that his orthopedist is concerned that he might have a bone spur.  I personally reviewed MR lumbar spine and MR hip obtained 08/17/2020 which demonstrated mild bilateral neuroforaminal narrowing at L2-L3, but no concern for cord compression.  He states that his left leg feels weak, but this has been an ongoing persistent issue over the course of  the past few months which has resulted in multiple falls.  Patient denies any urinary symptoms, new numbness, bilateral radiculopathy, history of malignancy, fevers or chills, IVDA, chest pain or shortness of breath, abdominal pain, changes in his bowel habits, or other symptoms.   HPI     Past Medical History:  Diagnosis Date  . Arthritis   . CAD (coronary artery disease)  cardiac cath with moderate disease in a septal branch of the ramus intermedius 04/01/2012  . Depression   . Diabetes mellitus    poorly controlled by his report  . History of narcotic addiction ([place])    past history of back pain  . Hypercholesteremia   . Hypertension   . IBS (irritable bowel syndrome)   . 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[place])   . Neuropathy   . Obesity    Max weight was 390  . OSA on CPAP   . Panic attacks   . Testosterone deficiency   . Vertigo     Patient Active Problem List   Diagnosis Date Noted  . Acute left-sided low back pain with right-sided sciatica 07/19/2020  . Hip pain, acute, left 07/05/2020  . Tremor 07/05/2020  . Weakness 07/05/2020  . Balance problem 03/11/2020  . Tinea cruris 03/11/2020  . Cellulitis of left groin 03/11/2020  . Acute pain of right shoulder 03/11/2020  . Interstitial lung disease ([place]) 05/02/2017  . Pansinusitis 09/26/2016  . Diabetic polyneuropathy associated with diabetes mellitus due to underlying condition ([place]) 05/08/2016  . Methamphetamine use disorder, severe, dependence ([place]) 02/11/2016  .  Substance induced mood disorder ([place]) 02/11/2016  . Exertional dyspnea 11/30/2015  . ADD (attention deficit disorder) 09/29/2015  . Binge eating 09/29/2015  . Syncope 05/05/2012  . Diarrhea 05/05/2012  . Panic attacks 05/05/2012  . OSA (obstructive sleep apnea) 05/05/2012  . HTN (hypertension) 05/05/2012  . Diabetes mellitus, type II ([place]) 05/05/2012  . Dyslipidemia 05/05/2012  . CAD (coronary artery disease)  cardiac cath with moderate disease in a septal  branch of the ramus intermedius 04/01/2012  . Chest pain 04/01/2012  . Drug abuse and dependence ([place]) 04/01/2012  . Family history of coronary artery disease 03/31/2012  . Sleep apnea, on C-pap 03/31/2012  . Hyperlipemia 03/30/2012  . HTN (hypertension), benign 03/30/2012  . Morbid obesity ([place]) 03/30/2012  . DM type 2, uncontrolled, with neuropathy ([place]) 03/30/2012  . Depression with suicidal ideation 03/30/2012    Past Surgical History:  Procedure Laterality Date  . CARDIAC CATHETERIZATION    . LEFT HEART CATHETERIZATION WITH CORONARY ANGIOGRAM N/A 03/31/2012   Procedure: LEFT HEART CATHETERIZATION WITH CORONARY ANGIOGRAM;  Surgeon: Leonie Man, MD;  Location: [facility] CATH LAB;  Service: Cardiovascular;  Laterality: N/A;       Family History  Problem Relation Age of Onset  . Diabetes type II Father   . Hypertension Father   . Pancreatic disease Father 70       Deceased  . Healthy Mother   . Healthy Sister   . Healthy Son   . Healthy Daughter   . Parkinson's disease Maternal Grandmother     Social History   Tobacco Use  . Smoking status: Current Every Day Smoker    Packs/day: 0.50    Types: Cigarettes  . Smokeless tobacco: Never Used  Vaping Use  . Vaping Use: Never used  Substance Use Topics  . Alcohol use: No    Alcohol/week: 0.0 standard drinks  . Drug use: Not Currently    Types: Methamphetamines    Home Medications Prior to Admission medications   Medication Sig Start Date End Date Taking? Authorizing Provider  diclofenac Sodium (VOLTAREN) 1 % GEL Apply 2 g topically 4 (four) times daily. 08/27/20  Yes Corena Herter, PA-C  acetaminophen (TYLENOL) 325 MG tablet Take 650 mg by mouth every 6 (six) hours as needed for mild pain, fever or headache.    [provider]  DULoxetine (CYMBALTA) 30 MG capsule Take 1 capsule (30 mg total) by mouth daily. 07/19/20   [name], DO  gabapentin (NEURONTIN) 300 MG capsule Take 2 tablets in the  morning, 1 tablet in the afternoon, and 1 tablet at bedtime. Patient taking differently: Take 300-600 mg by mouth See admin instructions. 600mg  in the morning 300mg  in the afternoon 300mg  at bedtime 04/29/20   Narda Amber K, DO  HYDROcodone-acetaminophen (NORCO) 5-325 MG tablet Take 1 tablet by mouth 2 (two) times daily as needed for moderate pain. 08/26/20   Aundra Dubin, PA-C  insulin aspart (NOVOLOG) 100 UNIT/ML injection Inject 10 Units into the skin 3 (three) times daily with meals. 07/19/20   [name] R, DO  insulin degludec (TRESIBA FLEXTOUCH) 100 UNIT/ML FlexTouch Pen Inject 10 Units into the skin daily. Patient taking differently: Inject 20 Units into the skin daily.  07/05/20   [name], DO  losartan (COZAAR) 100 MG tablet Take 1 tablet (100 mg total) by mouth daily. 07/05/20   [name], DO  meloxicam (MOBIC) 7.5 MG tablet Take 1 tablet (7.5 mg  total) by mouth 2 (two) times daily as needed for pain. 07/28/20   Leandrew Koyanagi, MD  methocarbamol (ROBAXIN) 500 MG tablet Take 1 tablet (500 mg total) by mouth 4 (four) times daily. 08/26/20   [name], DO  naproxen sodium (ALEVE) 220 MG tablet Take 440-660 mg by mouth 2 (two) times daily as needed (pain/headache).    [provider]  pravastatin (PRAVACHOL) 40 MG tablet Take 1 tablet (40 mg total) by mouth daily. 03/10/20   [name], DO  tamsulosin (FLOMAX) 0.4 MG CAPS capsule Take 1 capsule (0.4 mg total) by mouth daily. 03/10/20   [name], DO  traMADol (ULTRAM) 50 MG tablet Take 1-2 tablets by mouth every 8 hours as needed for pain. 08/15/20   Aundra Dubin, PA-C    Allergies    Gabapentin  Review of Systems   Review of Systems  All other systems reviewed and are negative.   Physical Exam Updated Vital Signs BP (!) 178/102 (BP Location: Left Arm)   Pulse 80   Temp (!) 97.2 F (36.2 C)   Ht 5\' 11"  (1.803 m)   Wt 131.5 kg   SpO2 97%   BMI 40.45  kg/m   Physical Exam Vitals and nursing note reviewed. Exam conducted with a chaperone present.  Constitutional:      General: He is not in acute distress.    Appearance: He is not toxic-appearing.  HENT:     Head: Normocephalic and atraumatic.  Eyes:     General: No scleral icterus.    Conjunctiva/sclera: Conjunctivae normal.  Cardiovascular:     Rate and Rhythm: Normal rate.     Pulses: Normal pulses.  Pulmonary:     Effort: Pulmonary effort is normal. No respiratory distress.  Musculoskeletal:     Comments: No midline spinal tenderness to palpation.  Mild-to-moderate tenderness in left lumbar paraspinous muscles and over left SI joint. No redness, warmth, swelling, or other overlying skin changes. Negative right-sided SLR.  Positive left-sided SLR with mild hip flexion to 20 degrees.  Radiation of pain down entire leg. Peripheral pulses intact and symmetric.  Sensation grossly intact. ROM left leg largely intact, however patient is hesitant and reluctant to participate due to pain symptoms.  Skin:    General: Skin is dry.  Neurological:     Mental Status: He is alert.     GCS: GCS eye subscore is 4. GCS verbal subscore is 5. GCS motor subscore is 6.  Psychiatric:        Mood and Affect: Mood normal.        Behavior: Behavior normal.        Thought Content: Thought content normal.     ED Results / Procedures / Treatments   Labs (all labs ordered are listed, but only abnormal results are displayed) Labs Reviewed - No data to display  EKG None  Radiology No results found.  Procedures Procedures (including critical care time)  Medications Ordered in ED Medications  morphine 4 MG/ML injection 4 mg (has no administration in time range)    ED Course  I have reviewed the triage vital signs and the nursing notes.  Pertinent labs & imaging results that were available during my care of the patient were reviewed by me and considered in my medical decision making (see  chart for details).    MDM Rules/Calculators/A&P  He is reluctant to move it independently, but has been able to demonstrate range of motion and movement.  He was able to sit up on the bed without assistance.  He reports that he has been taking tramadol that is prescribed by his primary care provider in addition to [place] prescribed by his orthopedist.  I am concerned given his substance use disorder, however given his significant pain symptoms will treat here in the ED with morphine and then discharge home.  He states that his friend will pick him up and they will stop by the pharmacy to pick up his recently prescribed medications on the way home.  I will also discharge him home with Voltaren gel for topical relief.  He has an appointment with his orthopedist on 08/30/2020 which he plans to attend.  MRI obtained 10 days ago personally reviewed.  With suspicion for cord compression or emergent spinal pathology.  He admits this is an ongoing subacute/chronic issue.  He simply is wanting surgical dimension at this time due to his ongoing pain symptoms.  ED return precautions discussed.  Patient voices understanding is agreeable to the plan.  Final Clinical Impression(s) / ED Diagnoses Final diagnoses:  Chronic left-sided low back pain with left-sided sciatica    Rx / DC Orders ED Discharge Orders         Ordered    diclofenac Sodium (VOLTAREN) 1 % GEL  4 times daily        08/27/20 1103           Reita Chard 08/27/20 1103    Lacretia Leigh, MD 08/30/20 [phone]

## 2020-08-27 NOTE — ED Triage Notes (Signed)
Pt arrived via EMS, from home, c/o left sided lower back pain, raidiating down leg. Hx several slipped discs in back. Sudden onset of pain worsening since last night. Denies any falls or trauma to area.   Tramadol @2 :30 this morning, little relief.

## 2020-08-30 ENCOUNTER — Other Ambulatory Visit: Payer: Self-pay | Admitting: Physician Assistant

## 2020-08-30 ENCOUNTER — Ambulatory Visit: Payer: Medicare HMO | Admitting: Orthopaedic Surgery

## 2020-08-30 ENCOUNTER — Ambulatory Visit (INDEPENDENT_AMBULATORY_CARE_PROVIDER_SITE_OTHER): Payer: Medicare HMO | Admitting: Orthopaedic Surgery

## 2020-08-30 DIAGNOSIS — G8929 Other chronic pain: Secondary | ICD-10-CM

## 2020-08-30 DIAGNOSIS — M5442 Lumbago with sciatica, left side: Secondary | ICD-10-CM | POA: Diagnosis not present

## 2020-08-30 MED ORDER — HYDROCODONE-ACETAMINOPHEN 5-325 MG PO TABS: 1.0000 | ORAL_TABLET | Freq: Two times a day (BID) | ORAL | 0 refills | Status: DC | PRN

## 2020-08-30 NOTE — Progress Notes (Signed)
Office Visit Note   Patient: Tyler Brewer           Date of Birth: [birth year]           MRN: [id number] Visit Date: 08/30/2020              Requested by: [address], [place], [place] [address] [place],  [place] [address] PCP: Carollee Herter, Alferd Apa, DO   Assessment & Plan: Visit Diagnoses:  1. Chronic bilateral low back pain with left-sided sciatica     Plan: Impression is continued left lower back pain and left leg pain with weakness with evidence of moderate right and advanced left facet changes L5-S1.  At this point, we will refer the patient to Dr. Ernestina Patches for facet block.  We will also refer him to Dr. Kathyrn Sheriff at [facility] for further evaluation and treatment recommendations.  Nurse made a referral to pain management as well.  He will follow-up with [place] as needed.  Follow-Up Instructions: Return if symptoms worsen or fail to improve.   Orders:  No orders of the defined types were placed in this encounter.  No orders of the defined types were placed in this encounter.     Procedures: No procedures performed   Clinical Data: No additional findings.   Subjective: Chief Complaint  Patient presents with  . Left Hip - Pain    HPI patient is a pleasant 60 year old gentleman who comes in today to discuss MRI results of the lumbar spine as well as the left hip.  He has been complaining of left leg pain for a while which has now become more of the left lower back and the entire left leg causing associated weakness and subsequent falls.  MRI of the left hip showed mild osteoarthritis and labral fissuring.  Lumbar spine showed moderate right and advanced left facet arthritis at L5-S1.  The patient denies any bowel or bladder change.  No saddle paresthesias.  No previous epidural steroid injection or facet block.     Objective: Vital Signs: There were no vitals taken for this visit.    Ortho Exam stable lumbar exam  Specialty Comments:  No specialty comments  available.  Imaging: No results found.   PMFS History: Patient Active Problem List   Diagnosis Date Noted  . Acute left-sided low back pain with right-sided sciatica 07/19/2020  . Hip pain, acute, left 07/05/2020  . Tremor 07/05/2020  . Weakness 07/05/2020  . Balance problem 03/11/2020  . Tinea cruris 03/11/2020  . Cellulitis of left groin 03/11/2020  . Acute pain of right shoulder 03/11/2020  . Interstitial lung disease ([place]) 05/02/2017  . Pansinusitis 09/26/2016  . Diabetic polyneuropathy associated with diabetes mellitus due to underlying condition ([place]) 05/08/2016  . Methamphetamine use disorder, severe, dependence ([place]) 02/11/2016  . Substance induced mood disorder ([place]) 02/11/2016  . Exertional dyspnea 11/30/2015  . ADD (attention deficit disorder) 09/29/2015  . Binge eating 09/29/2015  . Syncope 05/05/2012  . Diarrhea 05/05/2012  . Panic attacks 05/05/2012  . OSA (obstructive sleep apnea) 05/05/2012  . HTN (hypertension) 05/05/2012  . Diabetes mellitus, type II ([place]) 05/05/2012  . Dyslipidemia 05/05/2012  . CAD (coronary artery disease)  cardiac cath with moderate disease in a septal branch of the ramus intermedius 04/01/2012  . Chest pain 04/01/2012  . Drug abuse and dependence ([place]) 04/01/2012  . Family history of coronary artery disease 03/31/2012  . Sleep apnea, on C-pap 03/31/2012  . Hyperlipemia 03/30/2012  .  HTN (hypertension), benign 03/30/2012  . Morbid obesity ([place]) 03/30/2012  . DM type 2, uncontrolled, with neuropathy ([place]) 03/30/2012  . Depression with suicidal ideation 03/30/2012   Past Medical History:  Diagnosis Date  . Arthritis   . CAD (coronary artery disease)  cardiac cath with moderate disease in a septal branch of the ramus intermedius 04/01/2012  . Depression   . Diabetes mellitus    poorly controlled by his report  . History of narcotic addiction ([place])    past history of back pain  . Hypercholesteremia   . Hypertension   . IBS  (irritable bowel syndrome)   . 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[place])   . Neuropathy   . Obesity    Max weight was 390  . OSA on CPAP   . Panic attacks   . Testosterone deficiency   . Vertigo     Family History  Problem Relation Age of Onset  . Diabetes type II Father   . Hypertension Father   . Pancreatic disease Father 72       Deceased  . Healthy Mother   . Healthy Sister   . Healthy Son   . Healthy Daughter   . Parkinson's disease Maternal Grandmother     Past Surgical History:  Procedure Laterality Date  . CARDIAC CATHETERIZATION    . LEFT HEART CATHETERIZATION WITH CORONARY ANGIOGRAM N/A 03/31/2012   Procedure: LEFT HEART CATHETERIZATION WITH CORONARY ANGIOGRAM;  Surgeon: Leonie Man, MD;  [facility] LAB;  Service: Cardiovascular;  Laterality: N/A;   Social History   Occupational History  . Not on file  Tobacco Use  . Smoking status: Current Every Day Smoker    Packs/day: 0.50    Types: Cigarettes  . Smokeless tobacco: Never Used  Vaping Use  . Vaping Use: Never used  Substance and Sexual Activity  . Alcohol use: No    Alcohol/week: 0.0 standard drinks  . Drug use: Not Currently    Types: Methamphetamines  . Sexual activity: Yes

## 2020-08-31 ENCOUNTER — Ambulatory Visit (HOSPITAL_COMMUNITY): Payer: Medicare HMO | Admitting: Licensed Clinical Social Worker

## 2020-08-31 ENCOUNTER — Telehealth (HOSPITAL_COMMUNITY): Payer: Self-pay | Admitting: Licensed Clinical Social Worker

## 2020-08-31 NOTE — Telephone Encounter (Signed)
I sent an email to [email or web address] at 2pm to connect for a video session. I waited in video session for 16 minutes. No response.

## 2020-09-07 ENCOUNTER — Encounter: Payer: Self-pay | Admitting: Orthopaedic Surgery

## 2020-09-07 ENCOUNTER — Encounter: Payer: Self-pay | Admitting: Family Medicine

## 2020-09-07 ENCOUNTER — Telehealth: Payer: Self-pay | Admitting: Orthopaedic Surgery

## 2020-09-07 DIAGNOSIS — K5900 Constipation, unspecified: Secondary | ICD-10-CM | POA: Diagnosis not present

## 2020-09-07 DIAGNOSIS — I10 Essential (primary) hypertension: Secondary | ICD-10-CM | POA: Diagnosis not present

## 2020-09-07 DIAGNOSIS — Z743 Need for continuous supervision: Secondary | ICD-10-CM | POA: Diagnosis not present

## 2020-09-07 DIAGNOSIS — R1032 Left lower quadrant pain: Secondary | ICD-10-CM | POA: Diagnosis not present

## 2020-09-07 DIAGNOSIS — R109 Unspecified abdominal pain: Secondary | ICD-10-CM | POA: Diagnosis not present

## 2020-09-07 NOTE — Telephone Encounter (Signed)
Patient called requesting a refill of hydrocodone and also calling about status of 2 different referrals. Sent message to [place] about referral status. Please contact patient when medication has been sent to pharmacy at [phone].

## 2020-09-08 ENCOUNTER — Telehealth: Payer: Self-pay | Admitting: Orthopaedic Surgery

## 2020-09-08 ENCOUNTER — Other Ambulatory Visit: Payer: Self-pay

## 2020-09-08 ENCOUNTER — Telehealth: Payer: Self-pay

## 2020-09-08 DIAGNOSIS — G8929 Other chronic pain: Secondary | ICD-10-CM

## 2020-09-08 NOTE — Telephone Encounter (Signed)
Yes we referred to nundkumar.  Can you check the status of this?  Thanks.

## 2020-09-08 NOTE — Telephone Encounter (Signed)
We don't admit ---- he would need to talk to his ortho or go to ER

## 2020-09-08 NOTE — Telephone Encounter (Signed)
Pt called stating he was supposed to have a referral to a neurosurgeon but never heard anything. He would like to make sure he's still supposed to be seeing someone and if so he would like their number so he can make an appt.   [phone]

## 2020-09-08 NOTE — Telephone Encounter (Signed)
Mychart msg sent to you also

## 2020-09-08 NOTE — Telephone Encounter (Signed)
Pt was seen in the ED yesterday

## 2020-09-08 NOTE — Telephone Encounter (Signed)
Pt called asking about his referral.  Pt said since he fell in front of the office that the pain is now in his right leg as well and his left leg. Pt said that he is having trouble caring for himself at this point due to the pain.

## 2020-09-08 NOTE — Telephone Encounter (Signed)
If needs continued narcotic, we need to refer to pain clinic or he needs to get from pcp.  I can call in tramadol if no hx of seizures.  Just let me know

## 2020-09-08 NOTE — Telephone Encounter (Signed)
[facility]  Or [facility]

## 2020-09-08 NOTE — Telephone Encounter (Signed)
Patient called he is requesting rx refill for hydrocodone to be sent to pharmacy   [facility] [address] - [place], [place] - [address] [place] [address]  [address], [place], [place] [address]   CB:[phone]

## 2020-09-08 NOTE — Telephone Encounter (Signed)
Duplicate msg. See other msg from lindsey.

## 2020-09-09 ENCOUNTER — Other Ambulatory Visit: Payer: Self-pay

## 2020-09-09 ENCOUNTER — Encounter: Payer: Self-pay | Admitting: Family Medicine

## 2020-09-09 ENCOUNTER — Telehealth: Payer: Self-pay | Admitting: Physical Medicine and Rehabilitation

## 2020-09-09 DIAGNOSIS — R262 Difficulty in walking, not elsewhere classified: Secondary | ICD-10-CM | POA: Diagnosis not present

## 2020-09-09 MED ORDER — TRAMADOL HCL 50 MG PO TABS: ORAL_TABLET | ORAL | 0 refills | Status: DC

## 2020-09-09 NOTE — Telephone Encounter (Signed)
Patient called. Would like to speak with Dr. Romona Curls nurse. His call back number is [phone]

## 2020-09-09 NOTE — Telephone Encounter (Signed)
Dr Carollee Herter -- please advise if pt should be evaluated in ER or needs in office visit?

## 2020-09-09 NOTE — Telephone Encounter (Signed)
Possible but it could also be stomach virus We actually told him to go to er yesterday due to his severe pain

## 2020-09-09 NOTE — Telephone Encounter (Signed)
I gave him Dr. Cleotilde Neer number. I also advised on the status of the other referrals

## 2020-09-09 NOTE — Telephone Encounter (Signed)
Called patient

## 2020-09-09 NOTE — Telephone Encounter (Signed)
He sees pain management     Dr Ernestina Patches

## 2020-09-09 NOTE — Telephone Encounter (Signed)
Spoke to patient. See other msgs.

## 2020-09-12 DIAGNOSIS — M5442 Lumbago with sciatica, left side: Secondary | ICD-10-CM | POA: Diagnosis not present

## 2020-09-12 DIAGNOSIS — G8929 Other chronic pain: Secondary | ICD-10-CM | POA: Diagnosis not present

## 2020-09-12 DIAGNOSIS — R29898 Other symptoms and signs involving the musculoskeletal system: Secondary | ICD-10-CM | POA: Diagnosis not present

## 2020-09-12 DIAGNOSIS — M545 Low back pain, unspecified: Secondary | ICD-10-CM | POA: Diagnosis not present

## 2020-09-12 NOTE — Telephone Encounter (Signed)
Disregard. I see that there are 2 referrals.

## 2020-09-12 NOTE — Telephone Encounter (Signed)
I see a referral to pain medicine in the patient's chart, but it is not to our department/ Dr. Ernestina Patches and not in our WQ. Please advise.

## 2020-09-19 ENCOUNTER — Other Ambulatory Visit: Payer: Self-pay | Admitting: Physician Assistant

## 2020-09-19 DIAGNOSIS — R29898 Other symptoms and signs involving the musculoskeletal system: Secondary | ICD-10-CM

## 2020-09-26 DIAGNOSIS — G629 Polyneuropathy, unspecified: Secondary | ICD-10-CM | POA: Diagnosis not present

## 2020-09-26 DIAGNOSIS — M5416 Radiculopathy, lumbar region: Secondary | ICD-10-CM | POA: Diagnosis not present

## 2020-09-26 DIAGNOSIS — R29898 Other symptoms and signs involving the musculoskeletal system: Secondary | ICD-10-CM | POA: Diagnosis not present

## 2020-09-27 ENCOUNTER — Telehealth (INDEPENDENT_AMBULATORY_CARE_PROVIDER_SITE_OTHER): Payer: Medicare HMO | Admitting: Family Medicine

## 2020-09-27 ENCOUNTER — Other Ambulatory Visit: Payer: Self-pay

## 2020-09-27 ENCOUNTER — Encounter: Payer: Self-pay | Admitting: Family Medicine

## 2020-09-27 DIAGNOSIS — M545 Low back pain, unspecified: Secondary | ICD-10-CM | POA: Diagnosis not present

## 2020-09-27 DIAGNOSIS — G8929 Other chronic pain: Secondary | ICD-10-CM | POA: Diagnosis not present

## 2020-09-27 MED ORDER — TRAMADOL HCL 50 MG PO TABS: ORAL_TABLET | ORAL | 0 refills | Status: DC

## 2020-09-27 NOTE — Progress Notes (Signed)
Virtual Visit via Telephone Note  I connected with Tyler Brewer on 09/27/20 at  1:20 PM EST by telephone and verified that I am speaking with the correct person using two identifiers.  Location: Patient: home alone  Provider: office    I discussed the limitations, risks, security and privacy concerns of performing an evaluation and management service by telephone and the availability of in person appointments. I also discussed with the patient that there may be a patient responsible charge related to this service. The patient expressed understanding and agreed to proceed.   History of Present Illness: Pt is home c/o severe back pain --- he sees ortho and neurosurgery.  A pain management referral has been placed  He has an MRI next weekend and has been told he will probably need surgery  Observations/Objective: There were no vitals filed for this visit. Pt has no vitals  Pt c/o severe back pain   Assessment and Plan: .1. Chronic low back pain, unspecified back pain laterality, unspecified whether sciatica present Mri next weekend  F/u pain med and neurosurgery  - traMADol (ULTRAM) 50 MG tablet; Take 1-2 tablets by mouth every 8 hours as needed for pain.  Dispense: 30 tablet; Refill: 0   Follow Up Instructions:    I discussed the assessment and treatment plan with the patient. The patient was provided an opportunity to ask questions and all were answered. The patient agreed with the plan and demonstrated an understanding of the instructions.   The patient was advised to call back or seek an in-person evaluation if the symptoms worsen or if the condition fails to improve as anticipated.  I provided 25 minutes of non-face-to-face time during this encounter.   [name], DO

## 2020-10-04 ENCOUNTER — Encounter: Payer: Self-pay | Admitting: Family Medicine

## 2020-10-04 DIAGNOSIS — G8929 Other chronic pain: Secondary | ICD-10-CM

## 2020-10-05 ENCOUNTER — Other Ambulatory Visit: Payer: Self-pay | Admitting: Family Medicine

## 2020-10-05 DIAGNOSIS — G8929 Other chronic pain: Secondary | ICD-10-CM

## 2020-10-05 DIAGNOSIS — M545 Low back pain, unspecified: Secondary | ICD-10-CM

## 2020-10-05 MED ORDER — TRAMADOL HCL 50 MG PO TABS: ORAL_TABLET | ORAL | 0 refills | Status: DC

## 2020-10-05 NOTE — Telephone Encounter (Signed)
I did send in tramadol during last virtual visit----- ortho / neuro placed pain management referral --- don't know if app was made yet I will send in more but pain management will need to take over  If pain management app is not made yet and we will need to con't to write med for few more months we will need uds

## 2020-10-08 ENCOUNTER — Inpatient Hospital Stay: Admission: RE | Admit: 2020-10-08 | Payer: Medicare HMO | Source: Ambulatory Visit

## 2020-10-11 ENCOUNTER — Encounter: Payer: Self-pay | Admitting: Physical Medicine & Rehabilitation

## 2020-10-12 ENCOUNTER — Ambulatory Visit
Admission: RE | Admit: 2020-10-12 | Discharge: 2020-10-12 | Disposition: A | Discharge status: Home / Self Care | Payer: Medicare HMO | Source: Ambulatory Visit | Attending: Physician Assistant | Admitting: Physician Assistant

## 2020-10-12 ENCOUNTER — Other Ambulatory Visit: Payer: Self-pay

## 2020-10-12 DIAGNOSIS — R531 Weakness: Secondary | ICD-10-CM | POA: Diagnosis not present

## 2020-10-12 DIAGNOSIS — M5124 Other intervertebral disc displacement, thoracic region: Secondary | ICD-10-CM | POA: Diagnosis not present

## 2020-10-12 DIAGNOSIS — M2578 Osteophyte, vertebrae: Secondary | ICD-10-CM | POA: Diagnosis not present

## 2020-10-12 DIAGNOSIS — R29898 Other symptoms and signs involving the musculoskeletal system: Secondary | ICD-10-CM

## 2020-10-12 DIAGNOSIS — M5134 Other intervertebral disc degeneration, thoracic region: Secondary | ICD-10-CM | POA: Diagnosis not present

## 2020-10-12 IMAGING — MR MR THORACIC SPINE W/O CM
4 of 5 series · 16 of 48 positions shown · non-contrast
Comparison: Lumbar spine MRI [DATE]. Chest radiographs
[DATE]. CT chest [DATE].

CLINICAL DATA: Left leg weakness. Additional history provided by
scanning technologist: Patient reports back and left hip pain, left
leg pain, symptoms for 6 months.

EXAM:
MRI THORACIC SPINE WITHOUT CONTRAST
TECHNIQUE: Multiplanar, multisequence MR imaging of the thoracic spine was
performed. No intravenous contrast was administered.

[Series 4: STIR · sagittal · 3.0mm · 1.09mm/px · 3 of 19 slices shown]
[im 3/19]
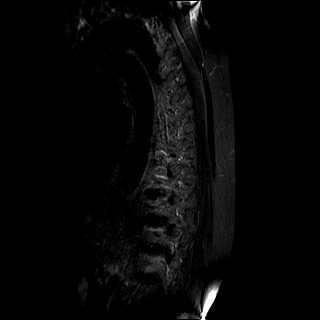
[im 11/19]
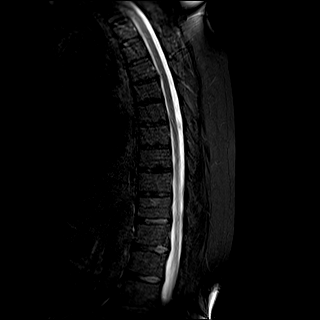
[im 16/19]
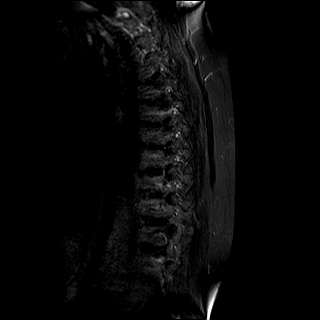

[Series 5: T2 post-contrast · sagittal · 3.0mm · 0.55mm/px · 7 of 19 slices shown]
[im 1/19]
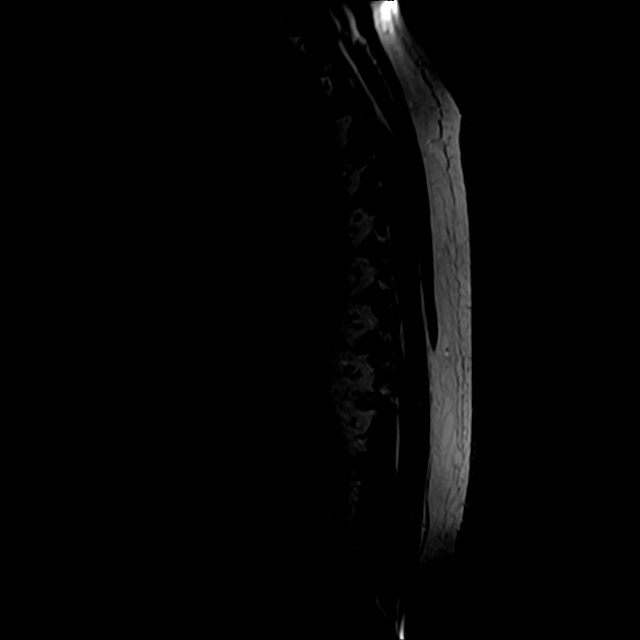
[im 4/19]
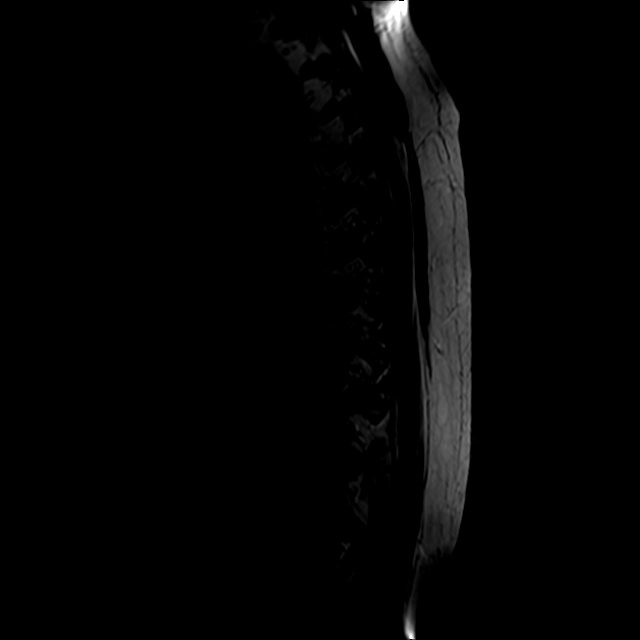
[im 7/19]
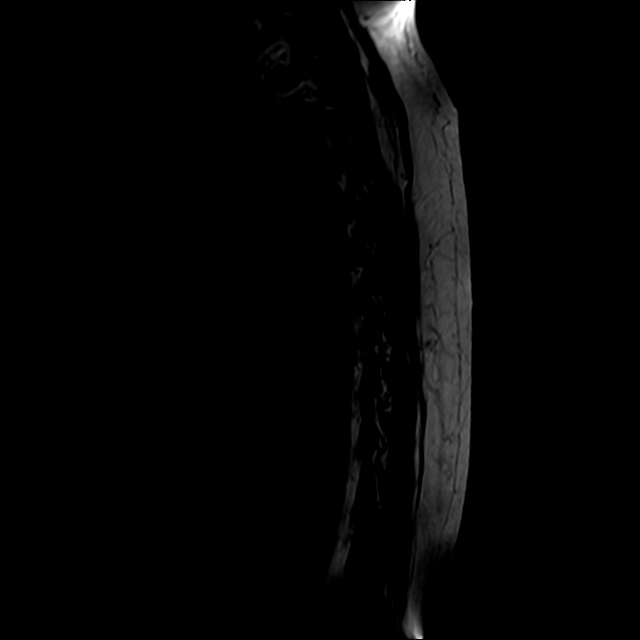
[im 10/19]
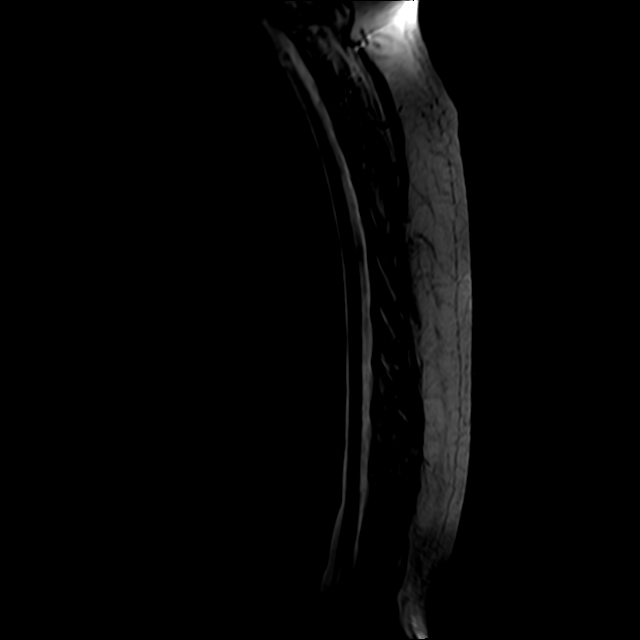
[im 13/19]
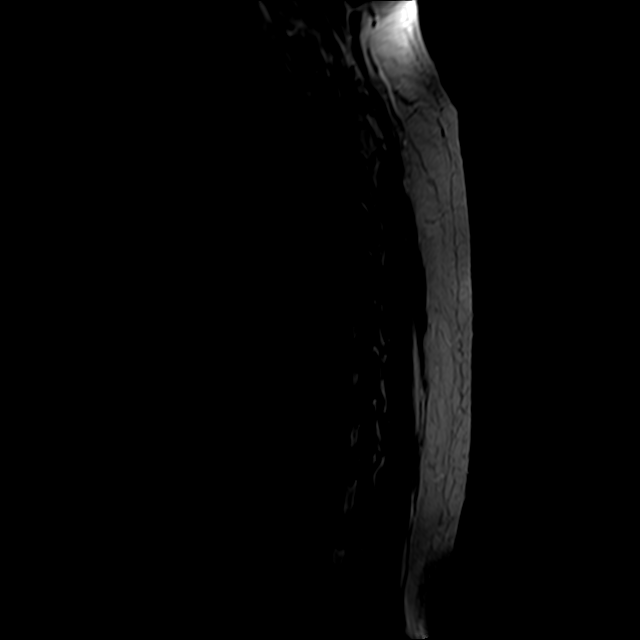
[im 16/19]
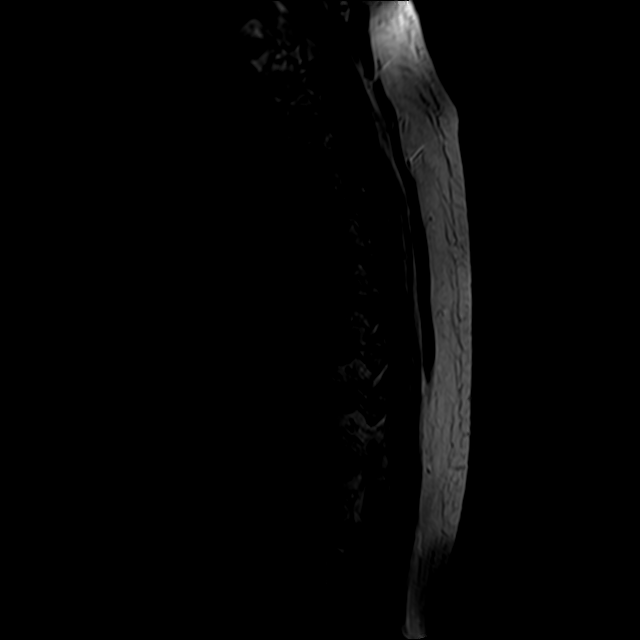
[im 19/19]
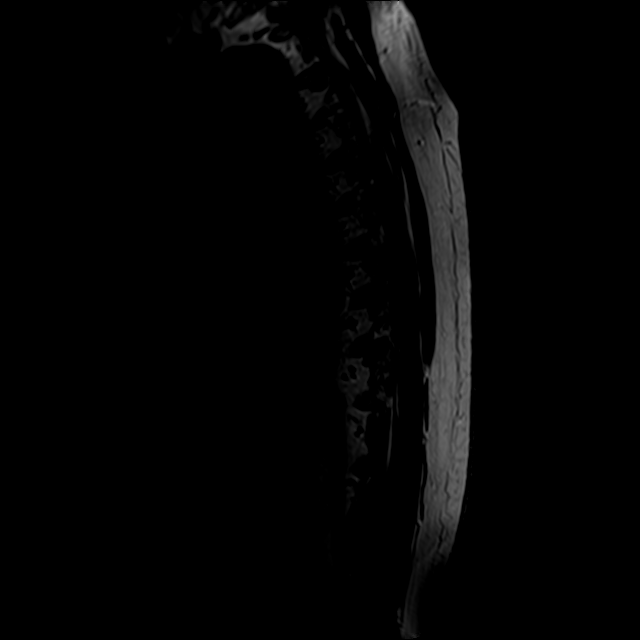

[Series 6: T1 · sagittal · 3.0mm · 0.55mm/px · 3 of 19 slices shown]
[im 4/19]
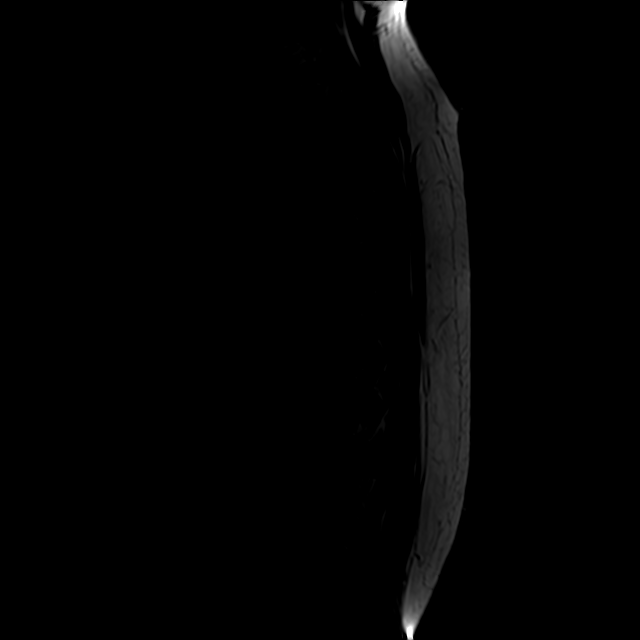
[im 10/19]
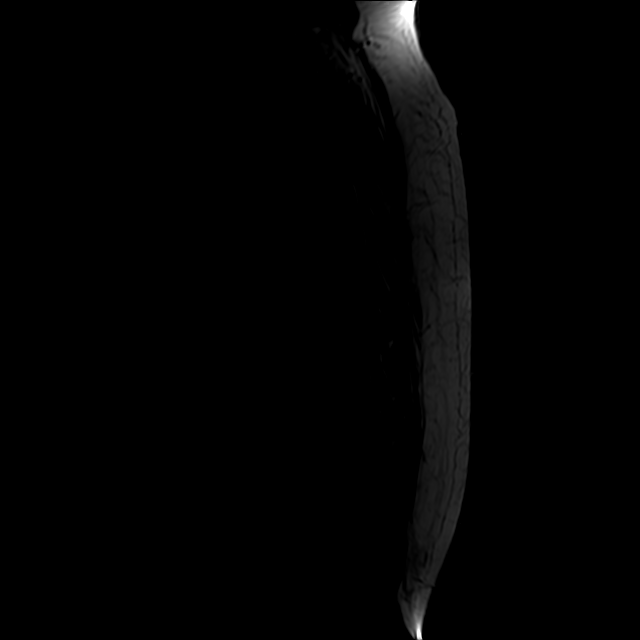
[im 16/19]
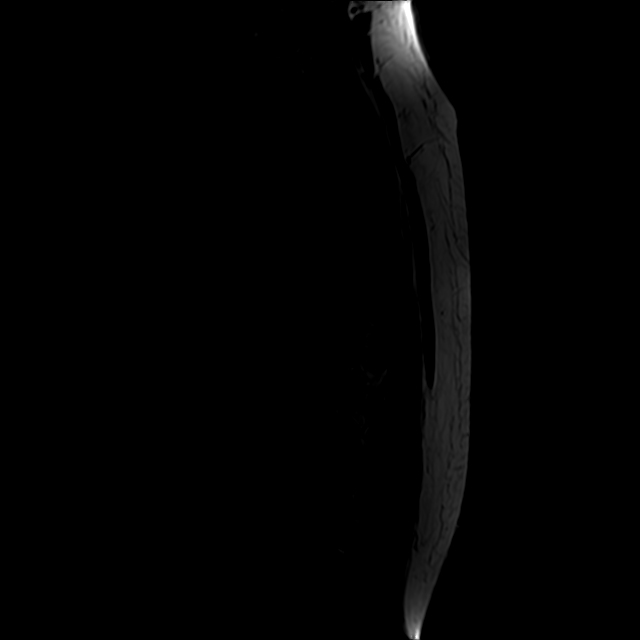

[Series 7: T2 · axial · 4.0mm · 0.39mm/px · z∈[-282,-101]mm · 3 of 36 slices shown]
[im 6/36]
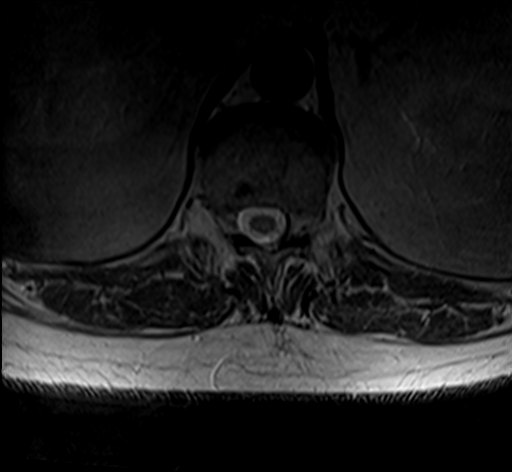
[im 18/36]
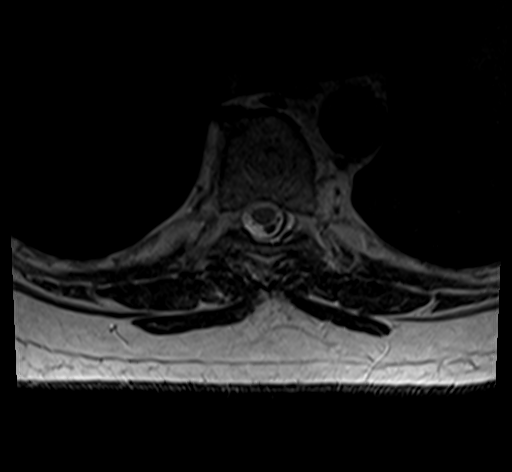
[im 30/36]
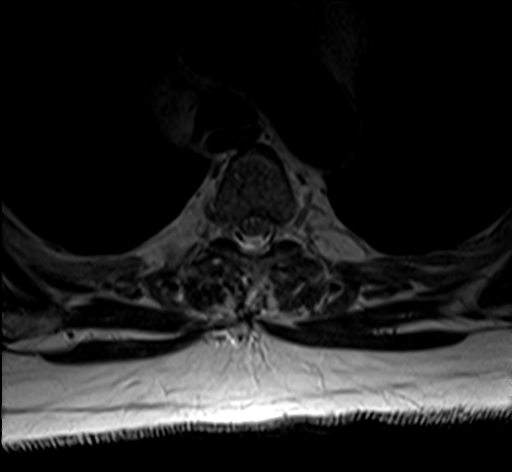

[16 of 48 positions shown; findings below may reference images not displayed]

FINDINGS: Mild intermittent motion degradation.

Alignment: No significant spondylolisthesis.

Vertebrae: Vertebral body height is maintained. Mild multilevel
degenerative endplate irregularity with small Schmorl nodes. No
significant marrow edema or focal suspicious osseous lesion.
Subcentimeter T1 and T2 hyperintense lesion within the T10 vertebral
body compatible with hemangioma. Multilevel ventrolateral
osteophytes.

Cord:  No spinal cord signal abnormality is identified.

Paraspinal and other soft tissues: No abnormality identified within
included portions of the thorax or upper abdomen/retroperitoneum.

Disc levels:

No more than mild disc degeneration at any level.

At T4-T5, there is a small right center disc protrusion which mildly
narrows the spinal canal, contacting and mildly flattening the right
ventral aspect of the spinal cord (series 8, image 11).

No significant disc herniation or spinal canal stenosis at the
remaining levels. No significant foraminal stenosis within the
thoracic spine.

Cervical spondylosis is incompletely assessed on scout localizer
imaging.
IMPRESSION: Mildly motion degraded exam.

Mild thoracic spondylosis as described. Most notably, a small T4-T5
right center disc protrusion mildly narrows the spinal canal,
contacting and mildly flattening the ventral spinal cord.

No significant disc herniation or spinal canal stenosis at the
remaining levels. No significant foraminal stenosis within the
thoracic spine.

## 2020-10-17 DIAGNOSIS — G629 Polyneuropathy, unspecified: Secondary | ICD-10-CM | POA: Diagnosis not present

## 2020-10-18 ENCOUNTER — Encounter: Payer: Self-pay | Admitting: Family Medicine

## 2020-10-19 NOTE — Telephone Encounter (Signed)
See what Dr Posey Pronto says ---- he can always ask neurosurgeon for second opinion -- [facility]  , [facility] or [place]

## 2020-10-21 ENCOUNTER — Ambulatory Visit: Payer: Medicare HMO | Admitting: Family Medicine

## 2020-10-21 ENCOUNTER — Encounter: Payer: Self-pay | Admitting: Family Medicine

## 2020-10-21 DIAGNOSIS — R300 Dysuria: Secondary | ICD-10-CM | POA: Diagnosis not present

## 2020-10-21 NOTE — Telephone Encounter (Signed)
Is this for jury duty?  In that case we need jury number.

## 2020-10-21 NOTE — Telephone Encounter (Signed)
Note is in mychart

## 2020-10-24 ENCOUNTER — Encounter: Payer: Self-pay | Admitting: Family Medicine

## 2020-10-25 ENCOUNTER — Encounter: Payer: Self-pay | Admitting: Family Medicine

## 2020-10-25 DIAGNOSIS — G8929 Other chronic pain: Secondary | ICD-10-CM

## 2020-10-25 DIAGNOSIS — M545 Low back pain, unspecified: Secondary | ICD-10-CM

## 2020-10-25 NOTE — Telephone Encounter (Signed)
He is seeing , ortho , neurosurgery and they referred him to pain management and mri

## 2020-10-27 ENCOUNTER — Other Ambulatory Visit: Payer: Self-pay

## 2020-10-27 ENCOUNTER — Encounter: Payer: Self-pay | Admitting: Neurology

## 2020-10-27 ENCOUNTER — Ambulatory Visit (INDEPENDENT_AMBULATORY_CARE_PROVIDER_SITE_OTHER): Payer: Medicare HMO | Admitting: Neurology

## 2020-10-27 VITALS — BP 160/108 | HR 90 | Ht 71.0 in | Wt 275.0 lb

## 2020-10-27 DIAGNOSIS — R29898 Other symptoms and signs involving the musculoskeletal system: Secondary | ICD-10-CM | POA: Diagnosis not present

## 2020-10-27 DIAGNOSIS — E1042 Type 1 diabetes mellitus with diabetic polyneuropathy: Secondary | ICD-10-CM

## 2020-10-27 DIAGNOSIS — E1140 Type 2 diabetes mellitus with diabetic neuropathy, unspecified: Secondary | ICD-10-CM | POA: Diagnosis not present

## 2020-10-27 MED ORDER — GABAPENTIN 300 MG PO CAPS: ORAL_CAPSULE | ORAL | 5 refills | Status: DC

## 2020-10-27 NOTE — Addendum Note (Signed)
Addended by: Jake Seats on: 10/27/2020 12:16 PM   Modules accepted: Orders

## 2020-10-27 NOTE — Progress Notes (Signed)
Needs refill on gabapentin, meloxicam, robaxin, asking if you will refill tramadol

## 2020-10-27 NOTE — Progress Notes (Signed)
Follow-up Visit   Date: 10/27/20   Tyler Brewer MRN: [id number] DOB: 1961/06/30   Interim History: Tyler Brewer is a 60 y.o. right-handed Caucasian male with poorly-controlled diabetes, CAD, OSA, obesity, and ADHD referred for urgent evaluation for falls and leg weakness. The patient was accompanied to the clinic by self.  He was last seen in Sept for painful neuropathy at which time his motor strength was full and intact.  Since the fall, he has progressive weakness in the left leg to the point where he is unable to raise the leg.  He is falling frequently and had to call EMS twice this week to help assist.  He saw Dr. Kathyrn Sheriff who did not find any compressive pathology of the thoracic or lumbar spine on MRI.  NCS/EMG was incomplete as patient was unable to tolerate testing.   Medications:  Current Outpatient Medications on File Prior to Visit  Medication Sig Dispense Refill  . acetaminophen (TYLENOL) 325 MG tablet Take 650 mg by mouth every 6 (six) hours as needed for mild pain, fever or headache.    . diclofenac Sodium (VOLTAREN) 1 % GEL Apply 2 g topically 4 (four) times daily. 50 g 0  . DULoxetine (CYMBALTA) 30 MG capsule Take 1 capsule (30 mg total) by mouth daily. 60 capsule 3  . gabapentin (NEURONTIN) 300 MG capsule Take 2 tablets in the morning, 1 tablet in the afternoon, and 1 tablet at bedtime. (Patient taking differently: Take 300-600 mg by mouth See admin instructions. 600mg  in the morning 300mg  in the afternoon 300mg  at bedtime) 120 capsule 5  . insulin aspart (NOVOLOG) 100 UNIT/ML injection Inject 10 Units into the skin 3 (three) times daily with meals. 10 mL PRN  . insulin degludec (TRESIBA FLEXTOUCH) 100 UNIT/ML FlexTouch Pen Inject 10 Units into the skin daily. (Patient taking differently: Inject 40 Units into the skin daily.) 3 mL 5  . losartan (COZAAR) 100 MG tablet Take 1 tablet (100 mg total) by mouth daily. 90 tablet 3  . meloxicam (MOBIC) 7.5 MG tablet  Take 1 tablet (7.5 mg total) by mouth 2 (two) times daily as needed for pain. 30 tablet 2  . methocarbamol (ROBAXIN) 500 MG tablet Take 1 tablet (500 mg total) by mouth 4 (four) times daily. 45 tablet 1  . naproxen sodium (ALEVE) 220 MG tablet Take 440-660 mg by mouth 2 (two) times daily as needed (pain/headache).    . pravastatin (PRAVACHOL) 40 MG tablet Take 1 tablet (40 mg total) by mouth daily. 30 tablet 4  . tamsulosin (FLOMAX) 0.4 MG CAPS capsule Take 1 capsule (0.4 mg total) by mouth daily. 90 capsule 3  . traMADol (ULTRAM) 50 MG tablet Take 1-2 tablets by mouth every 8 hours as needed for pain. 30 tablet 0   No current facility-administered medications on file prior to visit.    Allergies: No Known Allergies  Vital Signs:  BP (!) 160/108   Pulse 90   Ht 5\' 11"  (1.803 m)   Wt 275 lb (124.7 kg)   SpO2 100%   BMI 38.35 kg/m   Neurological Exam: MENTAL STATUS including orientation to time, place, person, recent and remote memory, attention span and concentration, language, and fund of knowledge is normal.  Speech is not dysarthric.  CRANIAL NERVES:  No visual field defects.  Pupils equal round and reactive to light.  Normal conjugate, extra-ocular eye movements in all directions of gaze.  No ptosis.  Face is symmetric.  MOTOR:  No  atrophy, fasciculations or abnormal movements.  No pronator drift.   Upper Extremity:  Right  Left  Deltoid  5/5   5/5   Biceps  5/5   5/5   Triceps  5/5   5/5   Infraspinatus 5/5  5/5  Medial pectoralis 5/5  5/5  Wrist extensors  5/5   5/5   Wrist flexors  5/5   5/5   Finger extensors  5/5   5/5   Finger flexors  5/5   5/5   Dorsal interossei  5/5   5/5   Abductor pollicis  5/5   5/5   Tone (Ashworth scale)  0  0   Lower Extremity:  Right  Left  Hip flexors  3/5   2/5   Hip extensors  3/5   3/5   Adductor 3/5  2/5  Abductor 4/5  4/5  Knee flexors  5/5   3/5   Knee extensors  5/5   3/5   Dorsiflexors  5-/5   2/5   Plantarflexors  5-/5    2/5   Toe extensors  5-/5   2/5   Toe flexors  5-/5   2/5   Tone (Ashworth scale)  0  0    MSRs:                                           Right        Left brachioradialis 2+  2+  biceps 2+  2+  triceps 2+  2+  patellar 0  0  ankle jerk 0  0  plantar response down  down   SENSORY:  Intact to vibration at the right knee and hands, absent vibration at the left knee and bilateral ankles.  Pin prick absent in the lower legs and feet. [name] Kitchen  COORDINATION/GAIT:  Gait is assisted with cane, mild dragging of the left foot, wide-based, stable.   Data: Lab Results  Component Value Date   HGBA1C 11.5 (H) 07/05/2020     IMPRESSION/PLAN: Bilateral leg weakness, significantly worse on the left where there is diffuse weakness involving both proximal and distal muscles.  His reflexes are absent in the legs and sensory loss is pronounced in the left leg. Pronounced change in his clinical exam from his last visit in September.  Imaging does not show any compressive lesion of the thoracic or lumbar spine.  NCS/EMG was attempted at [facility] and patient was unable to complete study due to pain. 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    PLAN/RECOMMENDATIONS:  Lumbar puncture - Check CSF cell count and diff, protein, glucose, IgG index, oligoclonal bands, flow cytology Previously tried:  Nortriptyline Continue gabapentin 600mg  in the morning, 300mg  in afternoon, and 300mg  at bedtime Patient is falling frequently at home and will most likely need in-patient rehab.  I have instructed him to go to the ER for expedited evaluation, should symptoms progress  Total time spent reviewing records, interview, history/exam, documentation, counseling, and coordination of care on day of encounter:  40 min     Thank you for allowing me to participate in patient's care.  If I can answer any additional questions, I would be pleased to do so.     Sincerely,    Elizabethanne Lusher K. Posey Pronto, DO

## 2020-10-27 NOTE — Patient Instructions (Addendum)
We will order spinal tap to evaluate your leg weakness  Go to the ER if your symptoms get worse to expedite your work-up

## 2020-10-28 ENCOUNTER — Ambulatory Visit: Payer: Medicare HMO | Admitting: Neurology

## 2020-10-28 MED ORDER — TRAMADOL HCL 50 MG PO TABS: ORAL_TABLET | ORAL | 0 refills | Status: DC

## 2020-10-28 NOTE — Telephone Encounter (Signed)
Requesting: Tramadol Contract: N/A UDS: N/A Last OV: 09/27/20--Virtual Next OV: N/A Last Refill: 10/05/2020, #30--0 RF Database:   Please advise

## 2020-10-29 ENCOUNTER — Other Ambulatory Visit: Payer: Self-pay | Admitting: Family Medicine

## 2020-10-29 DIAGNOSIS — E1140 Type 2 diabetes mellitus with diabetic neuropathy, unspecified: Secondary | ICD-10-CM

## 2020-10-29 DIAGNOSIS — IMO0002 Reserved for concepts with insufficient information to code with codable children: Secondary | ICD-10-CM

## 2020-10-31 ENCOUNTER — Other Ambulatory Visit: Payer: Self-pay

## 2020-10-31 ENCOUNTER — Encounter (HOSPITAL_BASED_OUTPATIENT_CLINIC_OR_DEPARTMENT_OTHER): Payer: Self-pay | Admitting: Emergency Medicine

## 2020-10-31 ENCOUNTER — Emergency Department (HOSPITAL_BASED_OUTPATIENT_CLINIC_OR_DEPARTMENT_OTHER): Payer: Medicare HMO

## 2020-10-31 ENCOUNTER — Emergency Department (HOSPITAL_BASED_OUTPATIENT_CLINIC_OR_DEPARTMENT_OTHER)
Admission: EM | Admit: 2020-10-31 | Discharge: 2020-10-31 | Disposition: A | Discharge status: Home / Self Care | Payer: Medicare HMO | Attending: Emergency Medicine | Admitting: Emergency Medicine

## 2020-10-31 DIAGNOSIS — I2510 Atherosclerotic heart disease of native coronary artery without angina pectoris: Secondary | ICD-10-CM | POA: Diagnosis not present

## 2020-10-31 DIAGNOSIS — E1165 Type 2 diabetes mellitus with hyperglycemia: Secondary | ICD-10-CM | POA: Diagnosis not present

## 2020-10-31 DIAGNOSIS — Z794 Long term (current) use of insulin: Secondary | ICD-10-CM | POA: Insufficient documentation

## 2020-10-31 DIAGNOSIS — M6281 Muscle weakness (generalized): Secondary | ICD-10-CM | POA: Diagnosis not present

## 2020-10-31 DIAGNOSIS — I10 Essential (primary) hypertension: Secondary | ICD-10-CM | POA: Diagnosis not present

## 2020-10-31 DIAGNOSIS — R55 Syncope and collapse: Secondary | ICD-10-CM | POA: Diagnosis not present

## 2020-10-31 DIAGNOSIS — R42 Dizziness and giddiness: Secondary | ICD-10-CM | POA: Diagnosis not present

## 2020-10-31 DIAGNOSIS — Z87891 Personal history of nicotine dependence: Secondary | ICD-10-CM | POA: Insufficient documentation

## 2020-10-31 DIAGNOSIS — Z743 Need for continuous supervision: Secondary | ICD-10-CM | POA: Diagnosis not present

## 2020-10-31 DIAGNOSIS — E1142 Type 2 diabetes mellitus with diabetic polyneuropathy: Secondary | ICD-10-CM | POA: Insufficient documentation

## 2020-10-31 LAB — CBC WITH DIFFERENTIAL/PLATELET
Abs Immature Granulocytes: 0.06 10*3/uL (ref 0.00–0.07)
Basophils Absolute: 0.1 10*3/uL (ref 0.0–0.1)
Basophils Relative: 1 %
Eosinophils Absolute: 0.3 10*3/uL (ref 0.0–0.5)
Eosinophils Relative: 3 %
HCT: 45.8 % (ref 39.0–52.0)
Hemoglobin: 16 g/dL (ref 13.0–17.0)
Immature Granulocytes: 1 %
Lymphocytes Relative: 17 %
Lymphs Abs: 1.5 10*3/uL (ref 0.7–4.0)
MCH: 29.9 pg (ref 26.0–34.0)
MCHC: 34.9 g/dL (ref 30.0–36.0)
MCV: 85.6 fL (ref 80.0–100.0)
Monocytes Absolute: 0.7 10*3/uL (ref 0.1–1.0)
Monocytes Relative: 8 %
Neutro Abs: 6.5 10*3/uL (ref 1.7–7.7)
Neutrophils Relative %: 70 %
Platelets: 305 10*3/uL (ref 150–400)
RBC: 5.35 MIL/uL (ref 4.22–5.81)
RDW: 12.1 % (ref 11.5–15.5)
WBC: 9.2 10*3/uL (ref 4.0–10.5)
nRBC: 0 % (ref 0.0–0.2)

## 2020-10-31 LAB — COMPREHENSIVE METABOLIC PANEL
ALT: 26 U/L (ref 0–44)
AST: 16 U/L (ref 15–41)
Albumin: 3.8 g/dL (ref 3.5–5.0)
Alkaline Phosphatase: 95 U/L (ref 38–126)
Anion gap: 10 (ref 5–15)
BUN: 24 mg/dL — ABNORMAL HIGH (ref 6–20)
CO2: 23 mmol/L (ref 22–32)
Calcium: 9.1 mg/dL (ref 8.9–10.3)
Chloride: 95 mmol/L — ABNORMAL LOW (ref 98–111)
Creatinine, Ser: 0.98 mg/dL (ref 0.61–1.24)
GFR, Estimated: >60 mL/min (ref >60)
Glucose, Bld: 461 mg/dL — ABNORMAL HIGH (ref 70–99)
Potassium: 4.2 mmol/L (ref 3.5–5.1)
Sodium: 128 mmol/L — ABNORMAL LOW (ref 135–145)
Total Bilirubin: 0.6 mg/dL (ref 0.3–1.2)
Total Protein: 7.2 g/dL (ref 6.5–8.1)

## 2020-10-31 LAB — URINALYSIS, MICROSCOPIC (REFLEX): WBC, UA: NONE SEEN WBC/hpf (ref 0–5)

## 2020-10-31 LAB — URINALYSIS, ROUTINE W REFLEX MICROSCOPIC
Bilirubin Urine: NEGATIVE
Glucose, UA: >=500 mg/dL — AB
Hgb urine dipstick: NEGATIVE
Ketones, ur: NEGATIVE mg/dL
Leukocytes,Ua: NEGATIVE
Nitrite: NEGATIVE
Protein, ur: NEGATIVE mg/dL
Specific Gravity, Urine: 1.01 (ref 1.005–1.030)
pH: 6 (ref 5.0–8.0)

## 2020-10-31 LAB — CBG MONITORING, ED
Glucose-Capillary: 409 mg/dL — ABNORMAL HIGH (ref 70–99)
Glucose-Capillary: 345 mg/dL — ABNORMAL HIGH (ref 70–99)
Glucose-Capillary: 290 mg/dL — ABNORMAL HIGH (ref 70–99)

## 2020-10-31 LAB — CK: Total CK: 31 U/L — ABNORMAL LOW (ref 49–397)

## 2020-10-31 LAB — TROPONIN I (HIGH SENSITIVITY)
Troponin I (High Sensitivity): 21 ng/L — ABNORMAL HIGH (ref <18)
Troponin I (High Sensitivity): 19 ng/L — ABNORMAL HIGH (ref <18)

## 2020-10-31 IMAGING — DX DG CHEST 1V PORT
1 series · 1 of 1 positions shown · non-contrast
Comparison: [DATE].

CLINICAL DATA: Syncope.

EXAM:
PORTABLE CHEST 1 VIEW

[chest ap]
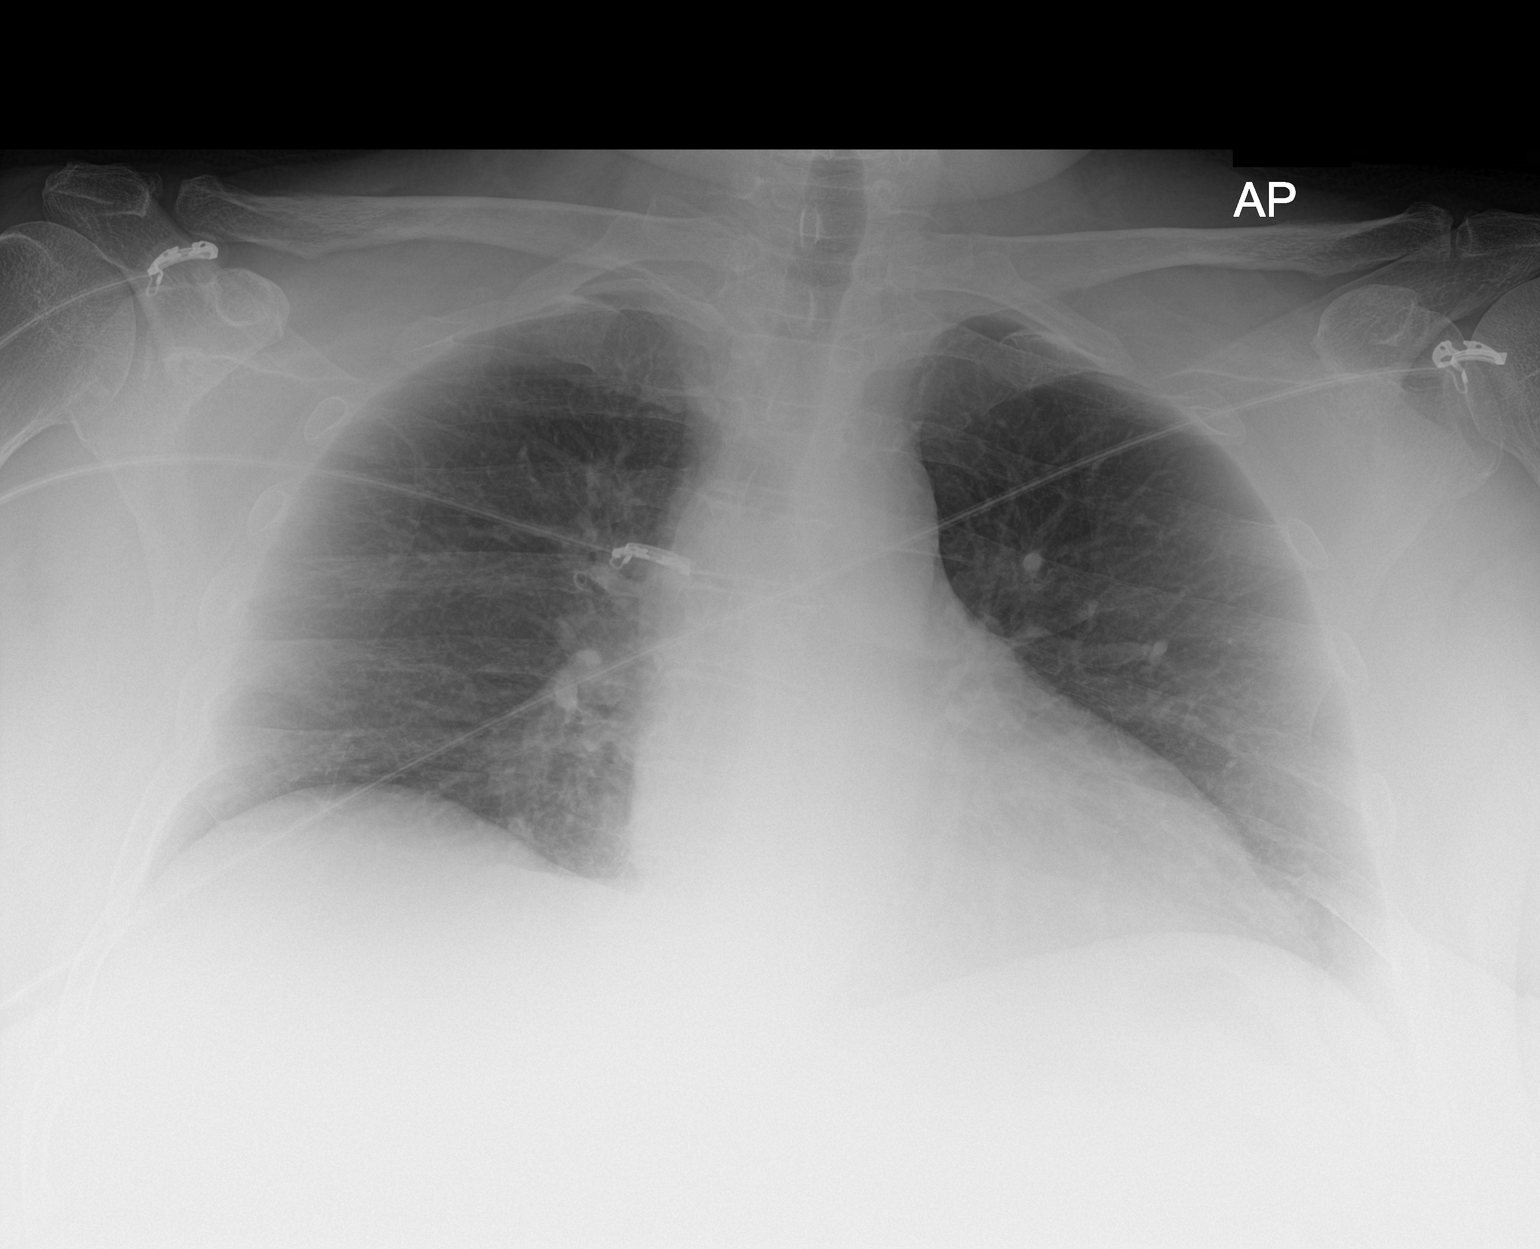

[1 of 1 positions shown; findings below may reference images not displayed]

FINDINGS: The heart size and mediastinal contours are within normal limits.
Both lungs are clear. No visible pleural effusions or pneumothorax.
No acute osseous abnormality.
IMPRESSION: Low lung volumes without evidence of acute cardiopulmonary disease.

## 2020-10-31 MED ORDER — ACETAMINOPHEN 325 MG PO TABS
650.0000 mg | ORAL_TABLET | Freq: Once | ORAL | Status: AC
  Administered 2020-10-31: 650 mg via ORAL
  Filled 2020-10-31: qty 2

## 2020-10-31 MED ORDER — INSULIN ASPART 100 UNIT/ML ~~LOC~~ SOLN
10.0000 [IU] | Freq: Once | SUBCUTANEOUS | Status: AC
  Administered 2020-10-31: 10 [IU] via SUBCUTANEOUS

## 2020-10-31 MED ORDER — SODIUM CHLORIDE 0.9 % IV BOLUS
1000.0000 mL | Freq: Once | INTRAVENOUS | Status: AC
  Administered 2020-10-31: 1000 mL via INTRAVENOUS

## 2020-10-31 MED ORDER — HYDRALAZINE HCL 10 MG PO TABS
10.0000 mg | ORAL_TABLET | Freq: Once | ORAL | Status: DC
  Filled 2020-10-31: qty 1

## 2020-10-31 MED ORDER — INSULIN REGULAR HUMAN 100 UNIT/ML IJ SOLN: 10.0000 [IU] | Freq: Once | INTRAMUSCULAR | Status: DC

## 2020-10-31 NOTE — Discharge Instructions (Signed)
Please closely follow-up with your primary care doctor as well as your neurologist for continued care.  Please take your insulin as prescribed.  Please continue take your other diabetes medications as well.  The tramadol, Flomax, Robaxin are all medications that can cause with 2 more easily passed out/syncopize.  Your work-up today was reassuring your blood sugar was very elevated but did improve with some fluids and insulin.

## 2020-10-31 NOTE — ED Notes (Signed)
Placed on cont cardiac monitoring with cont POX and int NBP assessment, CBG obtained and resulted EDP informed of results, IV NS bolus initiated

## 2020-10-31 NOTE — ED Triage Notes (Addendum)
Pt states he has been dizzy and weak for several weeks.  Issues for 6 months.  Passed out at home yesterday.  Pt unsure of head injury but was incontinent.  Pt states he was on the floor for three hours.  Pt states he has been having issues with left leg weakness since august.  Pt has back problems.

## 2020-10-31 NOTE — ED Triage Notes (Signed)
[facility] report-pt from home-c/o dizziness/weakness x 3 days-passed out in his home last night-pt was ambulatory at home-stated he had not taken DM meds due to he was too weak-was brought to ED WR via w/c

## 2020-10-31 NOTE — ED Provider Notes (Signed)
[facility] EMERGENCY DEPARTMENT Provider Note   CSN: [id number] Arrival date & time: 10/31/20  1536     History Chief Complaint  Patient presents with  . Loss of Consciousness    Tyler Brewer is a 60 y.o. male.  HPI Patient is a 60 year old male presented today for syncopal episode that occurred yesterday evening. Patient states that he has had ongoing issues with weakness, lower extremity weakness and is being worked up by neurology.  He states that he is not here for his chronic issues but is here for the syncopal episode that occurred yesterday evening.  He states that he was using the restroom and was attempting to stand up from the toilet however states that it was too low and he was therefore unable to stand up for quite some time.  He states that he was eventually able to with a lot of straining able to stand up but then felt very weak and lightheaded and went down to the ground and passed out.  He states he did not forcefully fall to the ground.  Denies any headache, chest pain, shortness of breath, lightheadedness or dizziness since the episode.  He states that he did lay on the ground for approximately 3 hours before being brought to the ER by EMS.  He states he has not used any insulin since yesterday evening.  He is not presently taking his blood pressure medication either.  He denies any other associate symptoms.  No aggravating mitigating factors.  No new symptoms.  Please see prior neurology notes for full details of his neurology work-up.  Notably he is waiting for a LP on Wednesday to further assess his weakness and neurologic symptoms.  States he has stopped taking metformin/other antihyperglycemics.     Past Medical History:  Diagnosis Date  . Arthritis   . CAD (coronary artery disease)  cardiac cath with moderate disease in a septal branch of the ramus intermedius 04/01/2012  . Depression   . Diabetes mellitus    poorly controlled by his report  .  History of narcotic addiction ([place])    past history of back pain  . Hypercholesteremia   . Hypertension   . IBS (irritable bowel syndrome)   . 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[place])   . Neuropathy   . Obesity    Max weight was 390  . OSA on CPAP   . Panic attacks   . Testosterone deficiency   . Vertigo     Patient Active Problem List   Diagnosis Date Noted  . Acute left-sided low back pain with right-sided sciatica 07/19/2020  . Hip pain, acute, left 07/05/2020  . Tremor 07/05/2020  . Weakness 07/05/2020  . Balance problem 03/11/2020  . Tinea cruris 03/11/2020  . Cellulitis of left groin 03/11/2020  . Acute pain of right shoulder 03/11/2020  . Interstitial lung disease ([place]) 05/02/2017  . Pansinusitis 09/26/2016  . Diabetic polyneuropathy associated with diabetes mellitus due to underlying condition ([place]) 05/08/2016  . Methamphetamine use disorder, severe, dependence ([place]) 02/11/2016  . Substance induced mood disorder ([place]) 02/11/2016  . Exertional dyspnea 11/30/2015  . ADD (attention deficit disorder) 09/29/2015  . Binge eating 09/29/2015  . Syncope 05/05/2012  . Diarrhea 05/05/2012  . Panic attacks 05/05/2012  . OSA (obstructive sleep apnea) 05/05/2012  . HTN (hypertension) 05/05/2012  . Diabetes mellitus, type II ([place]) 05/05/2012  . Dyslipidemia 05/05/2012  . CAD (coronary artery disease)  cardiac cath with moderate disease in a septal branch of the ramus  intermedius 04/01/2012  . Chest pain 04/01/2012  . Drug abuse and dependence ([place]) 04/01/2012  . Family history of coronary artery disease 03/31/2012  . Sleep apnea, on C-pap 03/31/2012  . Hyperlipemia 03/30/2012  . HTN (hypertension), benign 03/30/2012  . Morbid obesity ([address]) 03/30/2012  . DM type 2, uncontrolled, with neuropathy ([place]) 03/30/2012  . Depression with suicidal ideation 03/30/2012    Past Surgical History:  Procedure Laterality Date  . CARDIAC CATHETERIZATION    . LEFT HEART CATHETERIZATION WITH  CORONARY ANGIOGRAM N/A 03/31/2012   Procedure: LEFT HEART CATHETERIZATION WITH CORONARY ANGIOGRAM;  Surgeon: Leonie Man, MD;  Location[facility] C[facility] LAB;  Service: Cardiovascular;  Laterality: N/A;       Family History  Problem Relation Age of Onset  . Diabetes type II Father   . Hypertension Father   . Pancreatic disease Father 36       Deceased  . Healthy Mother   . Healthy Sister   . Healthy Son   . Healthy Daughter   . Parkinson's disease Maternal Grandmother     Social History   Tobacco Use  . Smoking status: Former Smoker    Packs/day: 0.50    Types: Cigarettes    Quit date: 09/29/2020    Years since quitting: 0.0  . Smokeless tobacco: Never Used  Vaping Use  . Vaping Use: Never used  Substance Use Topics  . Alcohol use: No    Alcohol/week: 0.0 standard drinks  . Drug use: Not Currently    Types: Methamphetamines    Home Medications Prior to Admission medications   Medication Sig Start Date End Date Taking? Authorizing Provider  acetaminophen (TYLENOL) 325 MG tablet Take 650 mg by mouth every 6 (six) hours as needed for mild pain, fever or headache.    [provider]  diclofenac Sodium (VOLTAREN) 1 % GEL Apply 2 g topically 4 (four) times daily. 08/27/20   Corena Herter, PA-C  DULoxetine (CYMBALTA) 30 MG capsule Take 1 capsule (30 mg total) by mouth daily. 07/19/20   [name], DO  gabapentin (NEURONTIN) 300 MG capsule Take 2 tablets in the morning, 1 tablet in the afternoon, and 1 tablet at bedtime. 10/27/20   Patel, Arvin Collard K, DO  insulin aspart (NOVOLOG) 100 UNIT/ML injection Inject 10 Units into the skin 3 (three) times daily with meals. 07/19/20   [name] R, DO  insulin degludec (TRESIBA FLEXTOUCH) 100 UNIT/ML FlexTouch Pen Inject 40 Units into the skin daily. 10/31/20   [name], DO  losartan (COZAAR) 100 MG tablet Take 1 tablet (100 mg total) by mouth daily. 07/05/20   [name], DO  meloxicam (MOBIC)  7.5 MG tablet Take 1 tablet (7.5 mg total) by mouth 2 (two) times daily as needed for pain. 07/28/20   Leandrew Koyanagi, MD  methocarbamol (ROBAXIN) 500 MG tablet Take 1 tablet (500 mg total) by mouth 4 (four) times daily. 08/26/20   [name], DO  naproxen sodium (ALEVE) 220 MG tablet Take 440-660 mg by mouth 2 (two) times daily as needed (pain/headache).    [provider]  pravastatin (PRAVACHOL) 40 MG tablet Take 1 tablet (40 mg total) by mouth daily. 03/10/20   [name], DO  tamsulosin (FLOMAX) 0.4 MG CAPS capsule Take 1 capsule (0.4 mg total) by mouth daily. 03/10/20   [name], DO  traMADol (ULTRAM) 50 MG tablet Take 1-2 tablets by mouth every  8 hours as needed for pain. 10/28/20   [name], DO    Allergies    Patient has no known allergies.  Review of Systems   Review of Systems  Constitutional: Positive for fatigue (chronic). Negative for chills and fever.  HENT: Negative for congestion.   Eyes: Negative for pain.  Respiratory: Negative for cough and shortness of breath.   Cardiovascular: Negative for chest pain and leg swelling.  Gastrointestinal: Negative for abdominal pain, constipation, diarrhea, nausea and vomiting.  Genitourinary: Negative for dysuria.  Musculoskeletal: Negative for myalgias.  Skin: Negative for rash.  Neurological: Positive for syncope. Negative for dizziness and headaches.    Physical Exam Updated Vital Signs BP (!) 179/101   Pulse 86   Temp 97.8 F (36.6 C)   Resp 16   Ht 5\' 11"  (1.803 m)   Wt 124.7 kg   SpO2 99%   BMI 38.35 kg/m   Physical Exam Vitals and nursing note reviewed.  Constitutional:      General: He is not in acute distress.    Appearance: He is obese.     Comments: Obese, 60 year old man.  Appears somewhat older than stated age.  HENT:     Head: Normocephalic and atraumatic.     Nose: Nose normal.     Mouth/Throat:     Mouth: Mucous membranes are dry.  Eyes:      General: No scleral icterus. Cardiovascular:     Rate and Rhythm: Normal rate and regular rhythm.     Pulses: Normal pulses.     Heart sounds: Normal heart sounds.  Pulmonary:     Effort: Pulmonary effort is normal. No respiratory distress.     Breath sounds: No wheezing.  Abdominal:     Palpations: Abdomen is soft.     Tenderness: There is no abdominal tenderness.     Comments: No abdominal tenderness, guarding or palpable masses.  No rebound tenderness no CVA tenderness.  Musculoskeletal:     Cervical back: Normal range of motion.     Right lower leg: No edema.     Left lower leg: No edema.     Comments: No significant lower extremity edema.  No calf tenderness or swelling.  Skin:    General: Skin is warm and dry.     Capillary Refill: Capillary refill takes less than 2 seconds.     Comments: No rashes or petechia  Neurological:     Mental Status: He is alert. Mental status is at baseline.  Psychiatric:        Mood and Affect: Mood normal.        Behavior: Behavior normal.     ED Results / Procedures / Treatments   Labs (all labs ordered are listed, but only abnormal results are displayed) Labs Reviewed  COMPREHENSIVE METABOLIC PANEL - Abnormal; Notable for the following components:      Result Value   Sodium 128 (*)    Chloride 95 (*)    Glucose, Bld 461 (*)    BUN 24 (*)    All other components within normal limits  CK - Abnormal; Notable for the following components:   Total CK 31 (*)    All other components within normal limits  URINALYSIS, ROUTINE W REFLEX MICROSCOPIC - Abnormal; Notable for the following components:   Glucose, UA >=500 (*)    All other components within normal limits  URINALYSIS, MICROSCOPIC (REFLEX) - Abnormal; Notable for the following components:   Bacteria, UA RARE (*)  All other components within normal limits  CBG MONITORING, ED - Abnormal; Notable for the following components:   Glucose-Capillary 409 (*)    All other components  within normal limits  CBG MONITORING, ED - Abnormal; Notable for the following components:   Glucose-Capillary 345 (*)    All other components within normal limits  CBG MONITORING, ED - Abnormal; Notable for the following components:   Glucose-Capillary 290 (*)    All other components within normal limits  TROPONIN I (HIGH SENSITIVITY) - Abnormal; Notable for the following components:   Troponin I (High Sensitivity) 21 (*)    All other components within normal limits  TROPONIN I (HIGH SENSITIVITY) - Abnormal; Notable for the following components:   Troponin I (High Sensitivity) 19 (*)    All other components within normal limits  CBC WITH DIFFERENTIAL/PLATELET    EKG EKG Interpretation  Date/Time:  Monday October 31 2020 15:51:49 EDT Ventricular Rate:  93 PR Interval:  188 QRS Duration: 82 QT Interval:  346 QTC Calculation: 430 R Axis:   -25 Text Interpretation: Normal sinus rhythm Nonspecific ST abnormality Abnormal ECG No significant change since last tracing Confirmed by Calvert Cantor [phone]) on 10/31/2020 4:30:40 PM   Radiology DG Chest Portable 1 View  Result Date: 10/31/2020 CLINICAL DATA:  Syncope. EXAM: PORTABLE CHEST 1 VIEW COMPARISON:  July 08, 2020. FINDINGS: The heart size and mediastinal contours are within normal limits. Both lungs are clear. No visible pleural effusions or pneumothorax. No acute osseous abnormality. IMPRESSION: Low lung volumes without evidence of acute cardiopulmonary disease. Electronically Signed   By: Margaretha Sheffield MD   On: 10/31/2020 18:33    Procedures Procedures   Medications Ordered in ED Medications  sodium chloride 0.9 % bolus 1,000 mL (0 mLs Intravenous Stopped 10/31/20 1859)  acetaminophen (TYLENOL) tablet 650 mg (650 mg Oral Given 10/31/20 1809)  insulin aspart (novoLOG) injection 10 Units (10 Units Subcutaneous Given 10/31/20 1810)    ED Course  I have reviewed the triage vital signs and the nursing notes.  Pertinent  labs & imaging results that were available during my care of the patient were reviewed by me and considered in my medical decision making (see chart for details).  Patient is a 60 year old poorly controlled diabetic gentleman with chronic neuropathy and chronic lower extremity weakness pain closely followed by neurology presented to the emergency department today because of syncopal episode that occurred yesterday evening when he was standing up from the toilet and was straining to stand up and got lightheaded and passed out.  From a trauma physical assessment patient is unremarkable.  Has no cranial tenderness and no tenderness to palpation of any of the bones of his body on my trauma exam. He is chronically ill-appearing but in no acute distress.  He does have dry oral mucosa and his blood sugar is significantly elevated today.  No anion gap and CO2 is within normal limits making DKA highly unlikely.  Seems to have normal mental status therefore symptomatic HHS is also unlikely.  Patient is simply hyperglycemic.  Likely dehydrated and prone to vasovagal syncope as a result.  Clinical Course as of 10/31/20 1949  Mon Oct 31, 2020  1913 Troponin I (High Sensitivity)(!) Troponin x2 without any significant delta 21>> 19  EKG without any evidence of ischemia.  No chest pain or shortness of breath. [WF]  1914 CBC without leukocytosis or anemia CMP with hyperglycemia which appears to be a chronic ongoing issue.  Sodium corrects to normal. [  WF]  1917 Glucose-Capillary(!): 290 CBG improved to 290 from original blood sugar 461. [WF]  1918 CK is below normal.  Doubt rhabdomyolysis. [WF]  1919 Urinalysis with glucosuria no other significant abnormalities. [WF]  1919 Patient has been IV hydrated. Has had no additional episodes of syncope or near syncope. Will recommend close follow-up with PCP.  Has been mildly hypertensive here Vital signs otherwise within normal limits. I reassessed patient he is  agreeable to discharge home, close follow-up with his neurologist and PCP. [WF]    Clinical Course User Index [WF] Tedd Sias, [place]   DG chest without any evidence of infiltrate or mediastinal widening.  Unremarkable chest x-ray.  Patient ambulated at his baseline prior to discharge.  Is tolerating p.o.  Blood sugar significantly improved.  Vital signs within normal is apart from hypertension.  Patient stands need to closely follow-up with his PCP to restart his medications which she has not been taking.  MDM Rules/Calculators/A&P                          Final Clinical Impression(s) / ED Diagnoses Final diagnoses:  Syncope, unspecified syncope type    Rx / DC Orders ED Discharge Orders    None       Tedd Sias, [place] 10/31/20 1952    Truddie Hidden, MD 10/31/20 2259

## 2020-11-01 ENCOUNTER — Emergency Department (HOSPITAL_BASED_OUTPATIENT_CLINIC_OR_DEPARTMENT_OTHER): Payer: Medicare HMO

## 2020-11-01 ENCOUNTER — Emergency Department (HOSPITAL_BASED_OUTPATIENT_CLINIC_OR_DEPARTMENT_OTHER)
Admission: EM | Admit: 2020-11-01 | Discharge: 2020-11-01 | Disposition: A | Discharge status: Home / Self Care | Payer: Medicare HMO | Attending: Emergency Medicine | Admitting: Emergency Medicine

## 2020-11-01 ENCOUNTER — Encounter (HOSPITAL_BASED_OUTPATIENT_CLINIC_OR_DEPARTMENT_OTHER): Payer: Self-pay

## 2020-11-01 DIAGNOSIS — I6381 Other cerebral infarction due to occlusion or stenosis of small artery: Secondary | ICD-10-CM | POA: Diagnosis not present

## 2020-11-01 DIAGNOSIS — W182XXA Fall in (into) shower or empty bathtub, initial encounter: Secondary | ICD-10-CM | POA: Insufficient documentation

## 2020-11-01 DIAGNOSIS — S0990XA Unspecified injury of head, initial encounter: Secondary | ICD-10-CM | POA: Diagnosis not present

## 2020-11-01 DIAGNOSIS — M25552 Pain in left hip: Secondary | ICD-10-CM | POA: Insufficient documentation

## 2020-11-01 DIAGNOSIS — I6782 Cerebral ischemia: Secondary | ICD-10-CM | POA: Diagnosis not present

## 2020-11-01 DIAGNOSIS — R42 Dizziness and giddiness: Secondary | ICD-10-CM | POA: Diagnosis not present

## 2020-11-01 DIAGNOSIS — E1165 Type 2 diabetes mellitus with hyperglycemia: Secondary | ICD-10-CM | POA: Diagnosis not present

## 2020-11-01 DIAGNOSIS — M545 Low back pain, unspecified: Secondary | ICD-10-CM | POA: Diagnosis not present

## 2020-11-01 DIAGNOSIS — R2242 Localized swelling, mass and lump, left lower limb: Secondary | ICD-10-CM | POA: Diagnosis not present

## 2020-11-01 DIAGNOSIS — R600 Localized edema: Secondary | ICD-10-CM | POA: Diagnosis not present

## 2020-11-01 DIAGNOSIS — M549 Dorsalgia, unspecified: Secondary | ICD-10-CM | POA: Diagnosis not present

## 2020-11-01 DIAGNOSIS — I119 Hypertensive heart disease without heart failure: Secondary | ICD-10-CM | POA: Insufficient documentation

## 2020-11-01 DIAGNOSIS — M25551 Pain in right hip: Secondary | ICD-10-CM | POA: Insufficient documentation

## 2020-11-01 DIAGNOSIS — M25561 Pain in right knee: Secondary | ICD-10-CM | POA: Diagnosis not present

## 2020-11-01 DIAGNOSIS — W19XXXA Unspecified fall, initial encounter: Secondary | ICD-10-CM

## 2020-11-01 DIAGNOSIS — I2510 Atherosclerotic heart disease of native coronary artery without angina pectoris: Secondary | ICD-10-CM | POA: Insufficient documentation

## 2020-11-01 DIAGNOSIS — M76892 Other specified enthesopathies of left lower limb, excluding foot: Secondary | ICD-10-CM | POA: Diagnosis not present

## 2020-11-01 DIAGNOSIS — G319 Degenerative disease of nervous system, unspecified: Secondary | ICD-10-CM | POA: Diagnosis not present

## 2020-11-01 DIAGNOSIS — Z743 Need for continuous supervision: Secondary | ICD-10-CM | POA: Diagnosis not present

## 2020-11-01 DIAGNOSIS — E1140 Type 2 diabetes mellitus with diabetic neuropathy, unspecified: Secondary | ICD-10-CM | POA: Insufficient documentation

## 2020-11-01 DIAGNOSIS — Y92002 Bathroom of unspecified non-institutional (private) residence single-family (private) house as the place of occurrence of the external cause: Secondary | ICD-10-CM | POA: Diagnosis not present

## 2020-11-01 DIAGNOSIS — Z794 Long term (current) use of insulin: Secondary | ICD-10-CM | POA: Diagnosis not present

## 2020-11-01 DIAGNOSIS — Z87891 Personal history of nicotine dependence: Secondary | ICD-10-CM | POA: Diagnosis not present

## 2020-11-01 DIAGNOSIS — R0602 Shortness of breath: Secondary | ICD-10-CM | POA: Diagnosis not present

## 2020-11-01 DIAGNOSIS — M5030 Other cervical disc degeneration, unspecified cervical region: Secondary | ICD-10-CM | POA: Diagnosis not present

## 2020-11-01 DIAGNOSIS — M79605 Pain in left leg: Secondary | ICD-10-CM | POA: Diagnosis not present

## 2020-11-01 DIAGNOSIS — M533 Sacrococcygeal disorders, not elsewhere classified: Secondary | ICD-10-CM | POA: Diagnosis not present

## 2020-11-01 DIAGNOSIS — M4602 Spinal enthesopathy, cervical region: Secondary | ICD-10-CM | POA: Diagnosis not present

## 2020-11-01 DIAGNOSIS — S199XXA Unspecified injury of neck, initial encounter: Secondary | ICD-10-CM | POA: Diagnosis not present

## 2020-11-01 DIAGNOSIS — M2578 Osteophyte, vertebrae: Secondary | ICD-10-CM | POA: Diagnosis not present

## 2020-11-01 IMAGING — CT CT CERVICAL SPINE W/O CM
3 of 4 series · 13 of 33 positions shown, 16 images · non-contrast
Comparison: [DATE]

CLINICAL DATA: Neck trauma. Fell getting out of bathtub hitting
head.

EXAM:
CT HEAD WITHOUT CONTRAST
CT CERVICAL SPINE WITHOUT CONTRAST
TECHNIQUE: Multidetector CT imaging of the head and cervical spine was
performed following the standard protocol without intravenous
contrast. Multiplanar CT image reconstructions of the cervical spine
were also generated.

[Series 3: c_spine 2.0 i30s 3 · axial · 0.31mm/px · z∈[-297,-179]mm · 5 of 89 slices shown, 7 images]
[im 15/89  soft-tissue]
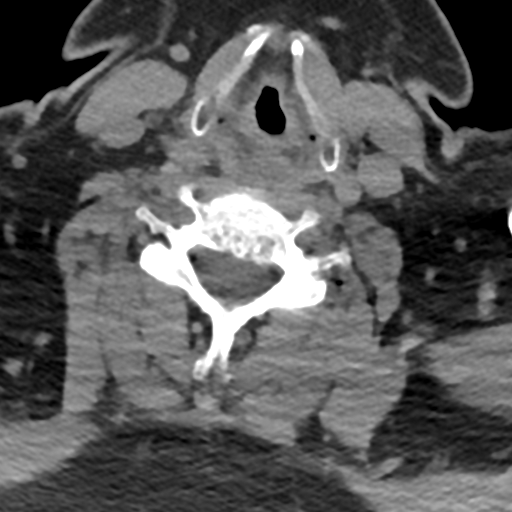
[im 15/89  bone]
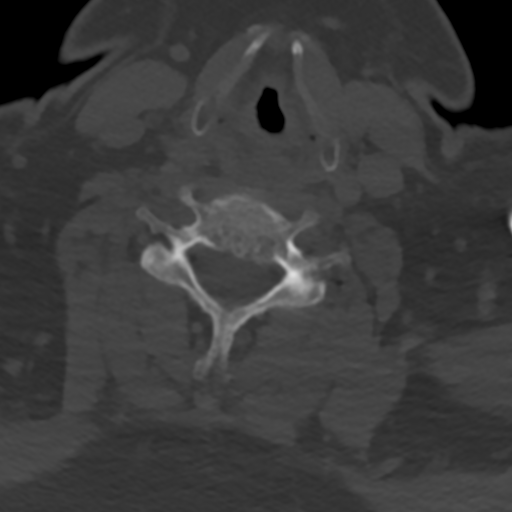
[im 30/89  bone]
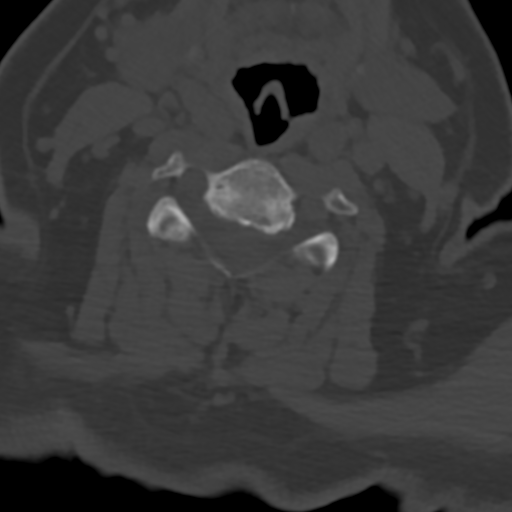
[im 45/89  bone]
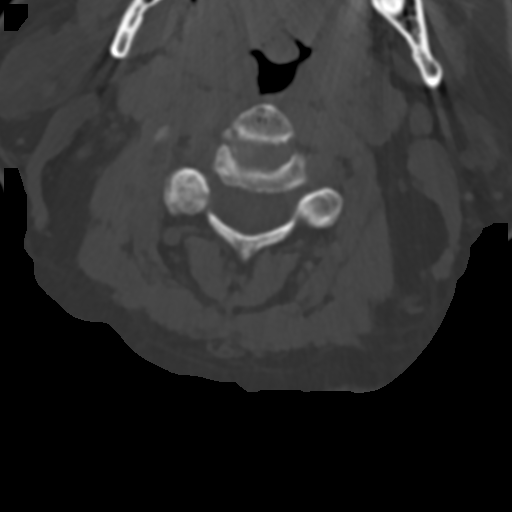
[im 59/89  bone]
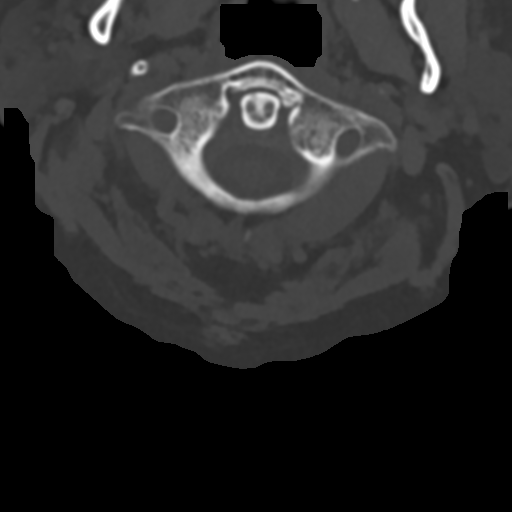
[im 74/89  soft-tissue]
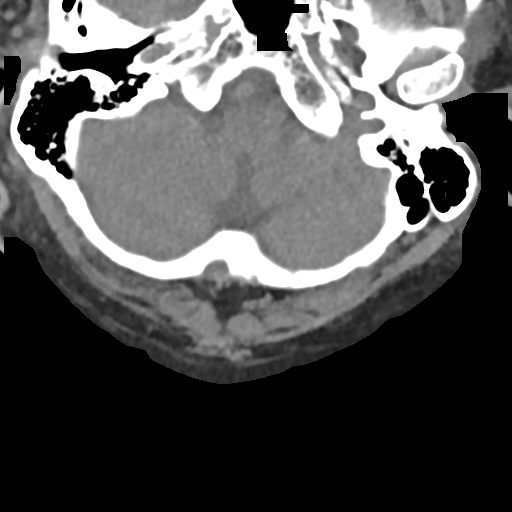
[im 74/89  bone]
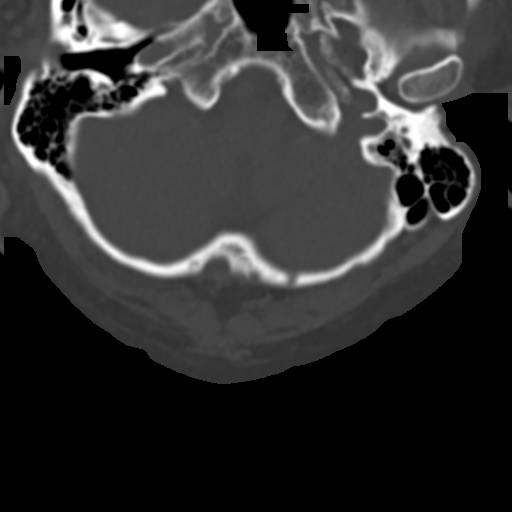

[Series 5: coronals · coronal · 0.26mm/px · 3 of 61 slices shown]
[im 13/61  bone]
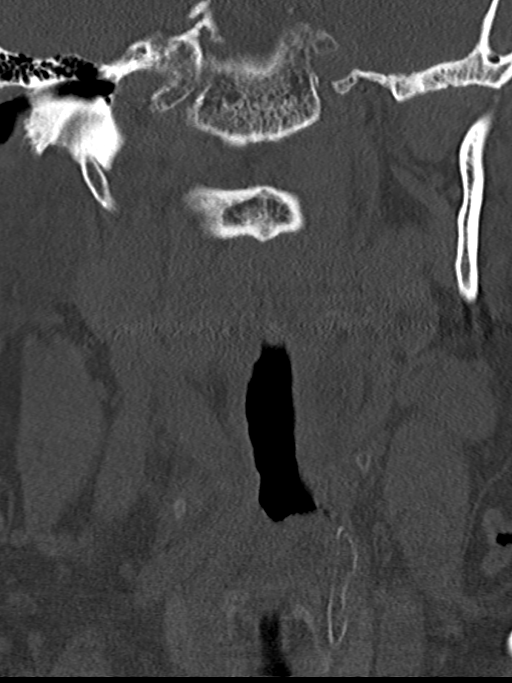
[im 25/61  bone]
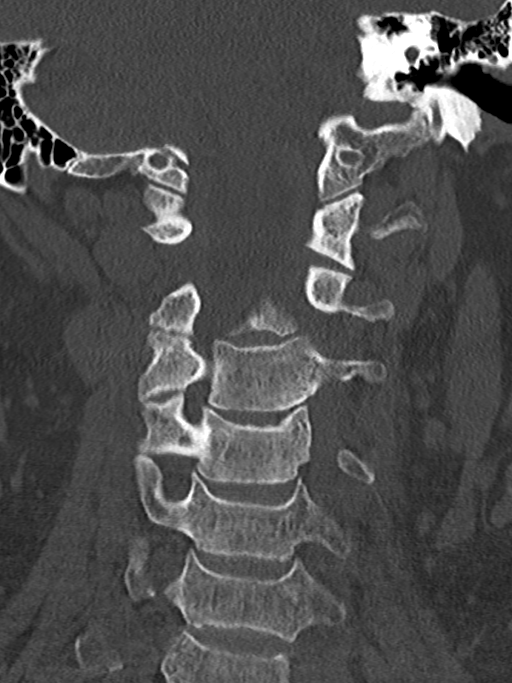
[im 37/61  bone]
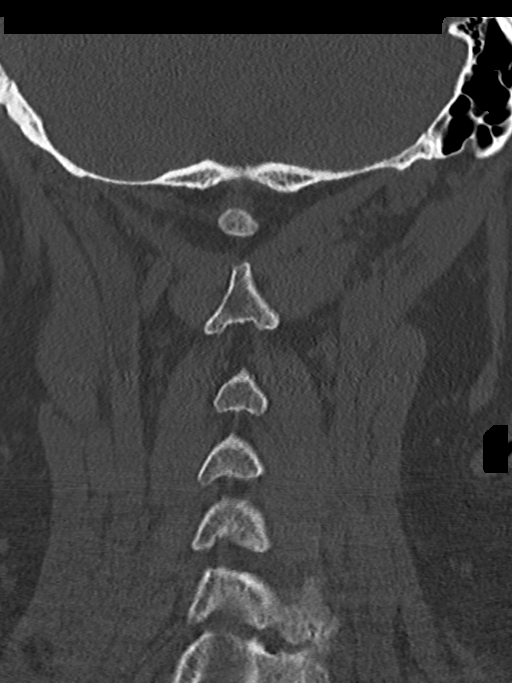

[Series 6: sagittals · sagittal · 0.26mm/px · 5 of 61 slices shown, 6 images]
[im 21/61  bone]
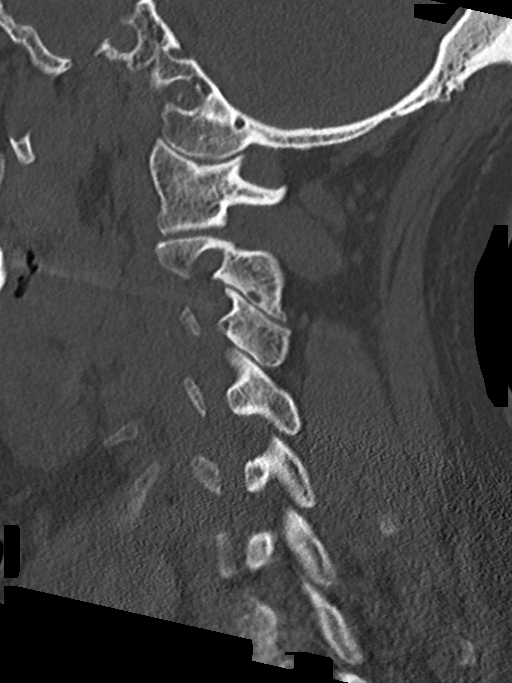
[im 26/61  bone]
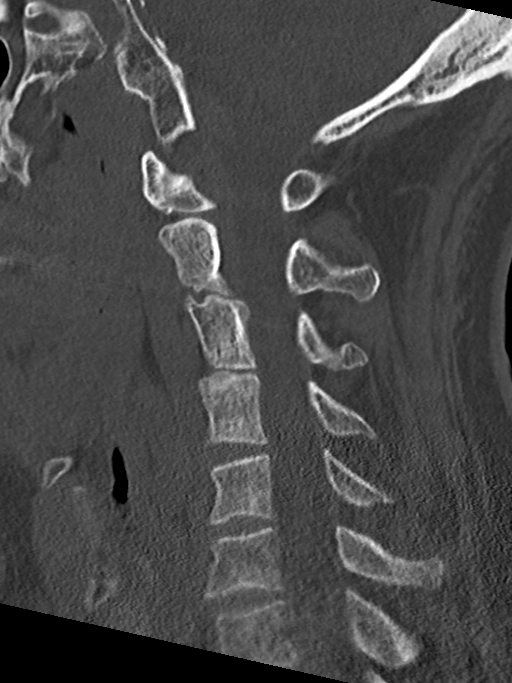
[im 31/61  soft-tissue]
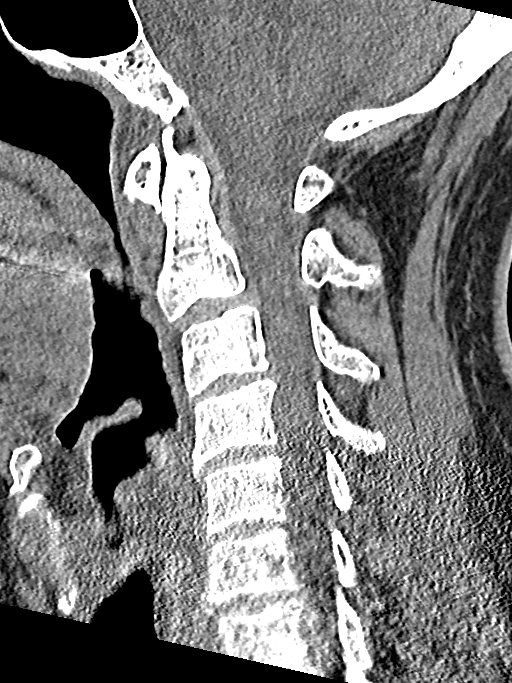
[im 31/61  bone]
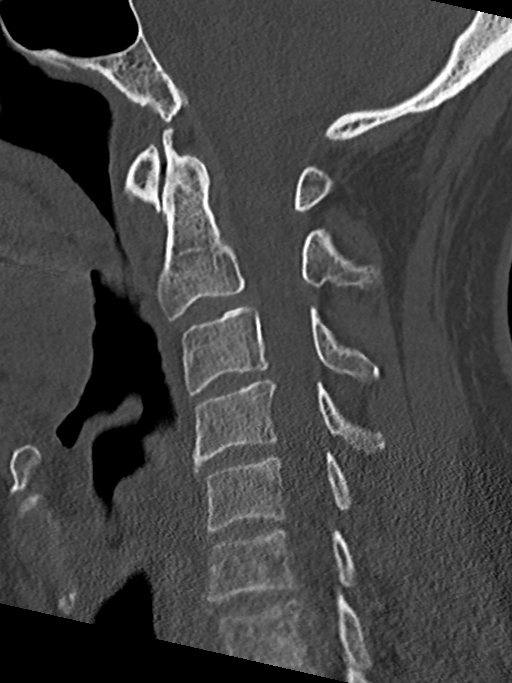
[im 36/61  bone]
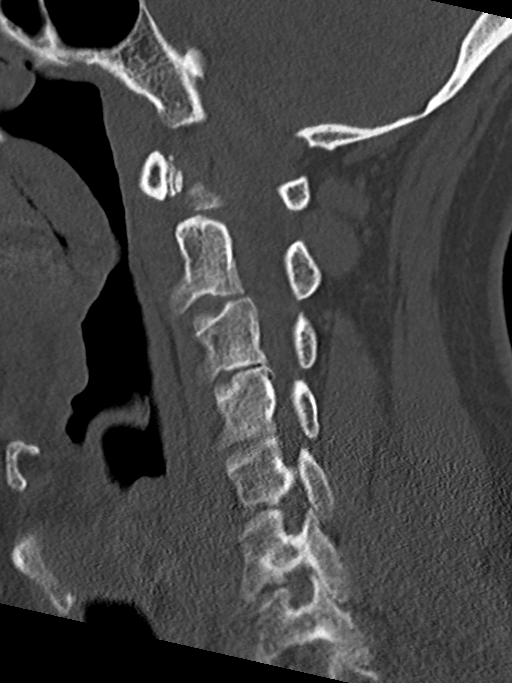
[im 41/61  bone]
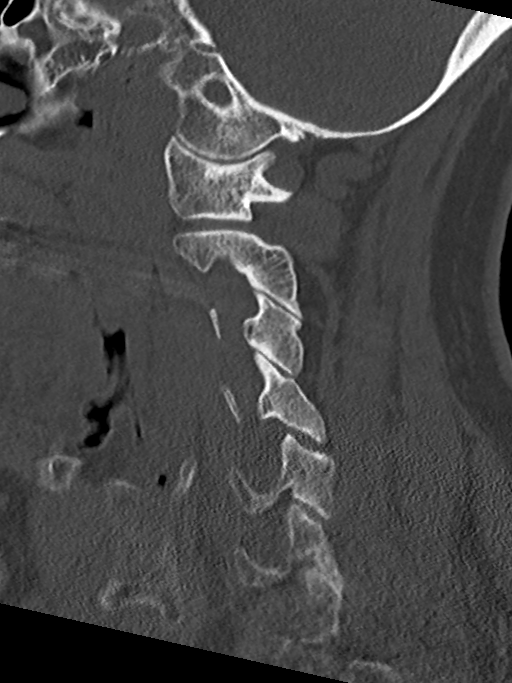

[13 of 33 positions shown; findings below may reference images not displayed]

FINDINGS: CT HEAD FINDINGS

Brain: No evidence of acute infarction, hemorrhage, hydrocephalus,
extra-axial collection or mass lesion/mass effect. There is mild
diffuse low-attenuation within the subcortical and periventricular
white matter compatible with chronic microvascular disease. Remote
lacunar infarct within the right frontal lobe periventricular white
matter, image [DATE]. This is new when compared with [DATE]

Vascular: No hyperdense vessel or unexpected calcification.

Skull: Normal. Negative for fracture or focal lesion.

Sinuses/Orbits: No acute finding.

Other: None

CT CERVICAL SPINE FINDINGS

Alignment: Normal alignment.

Skull base and vertebrae: The inferior margin of the C7 vertebra is
excluded from field of view. Imaged portions of the cervical
vertebra are intact without signs of acute fracture or dislocation.

Soft tissues and spinal canal: No prevertebral fluid or swelling. No
visible canal hematoma.

Disc levels: Mild disc space narrowing and endplate spurring noted
at C4-5 and C6-7

Upper chest: Negative.

Other: None
IMPRESSION: 1. No acute intracranial abnormalities.
2. Chronic small vessel ischemic change and brain atrophy.
3. No evidence for cervical spine fracture.
4. Mild cervical degenerative disc disease.

## 2020-11-01 IMAGING — DX DG KNEE COMPLETE 4+V*R*
4 series · 4 of 4 positions shown · non-contrast
Comparison: None.

CLINICAL DATA: Fall this morning getting out of the tub. Right knee
pain.

EXAM:
RIGHT KNEE - COMPLETE 4+ VIEW

[knee ap]
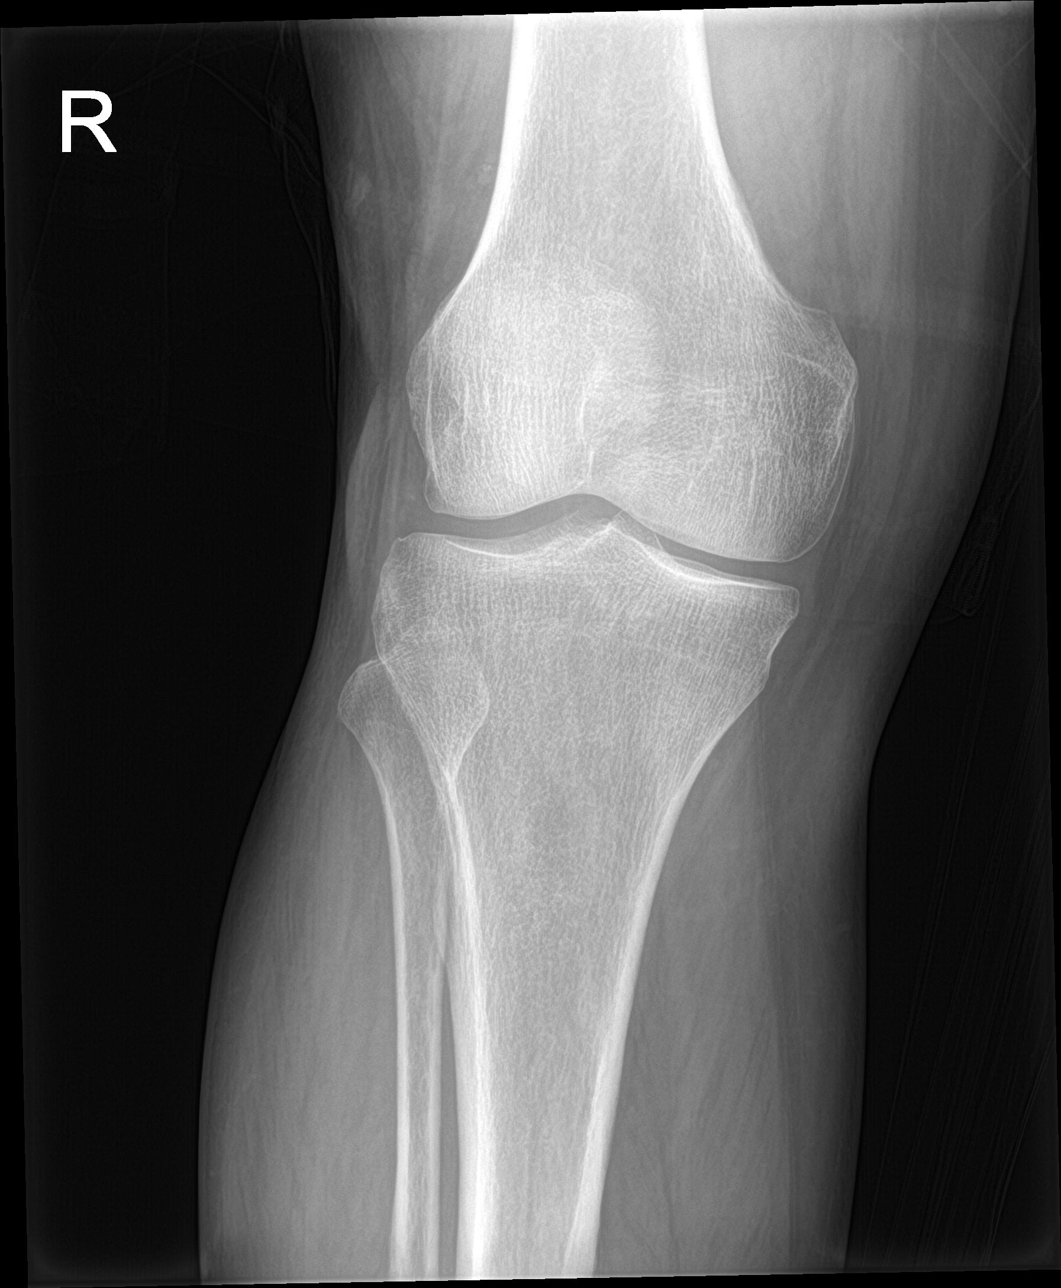

[knee lat]
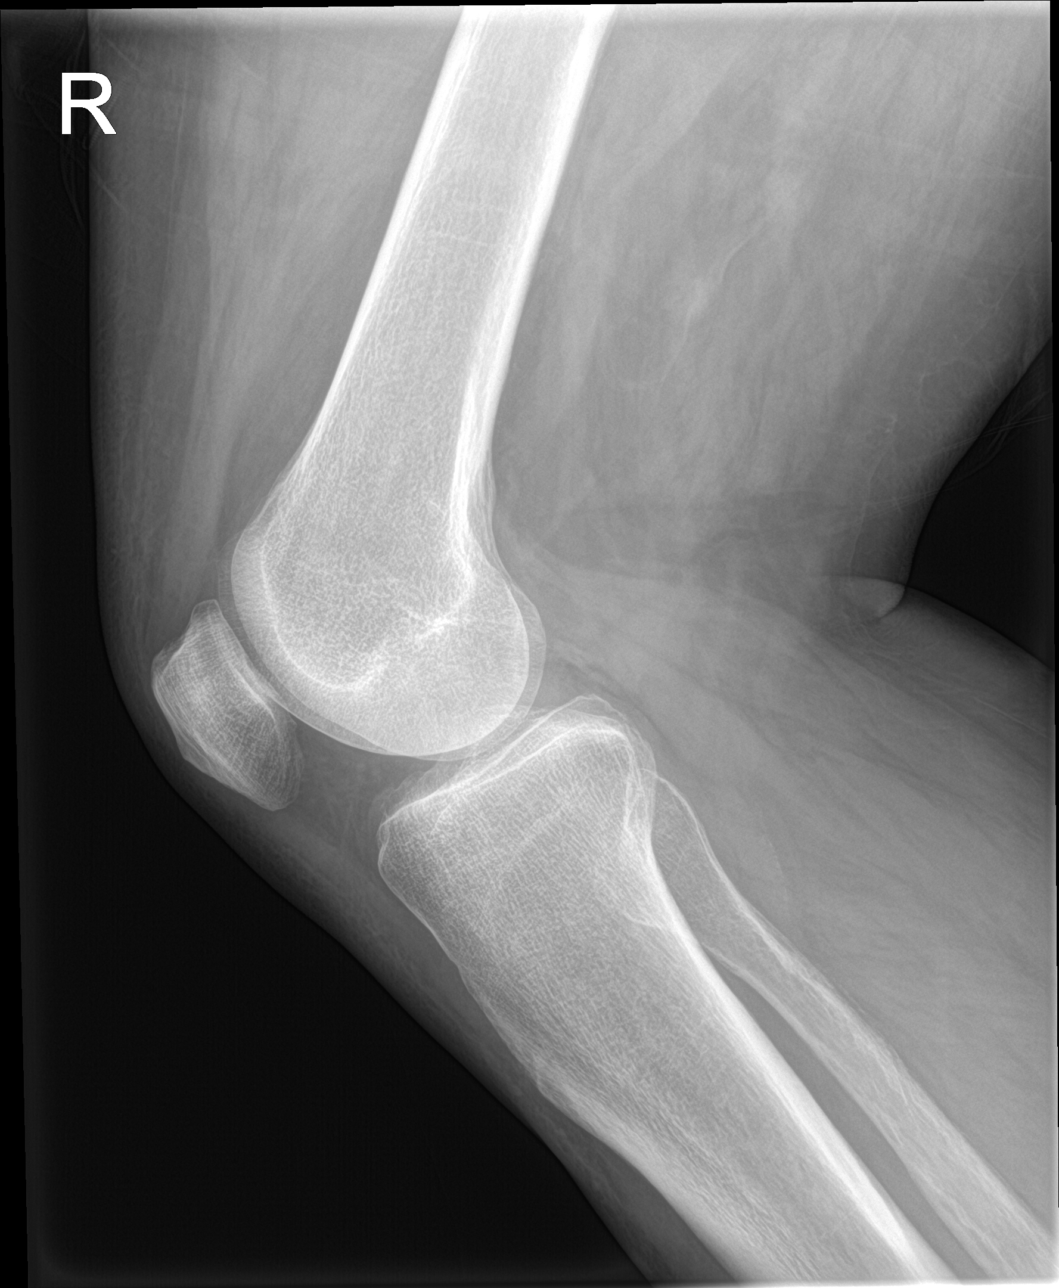

[knee obl (1 of 2)]
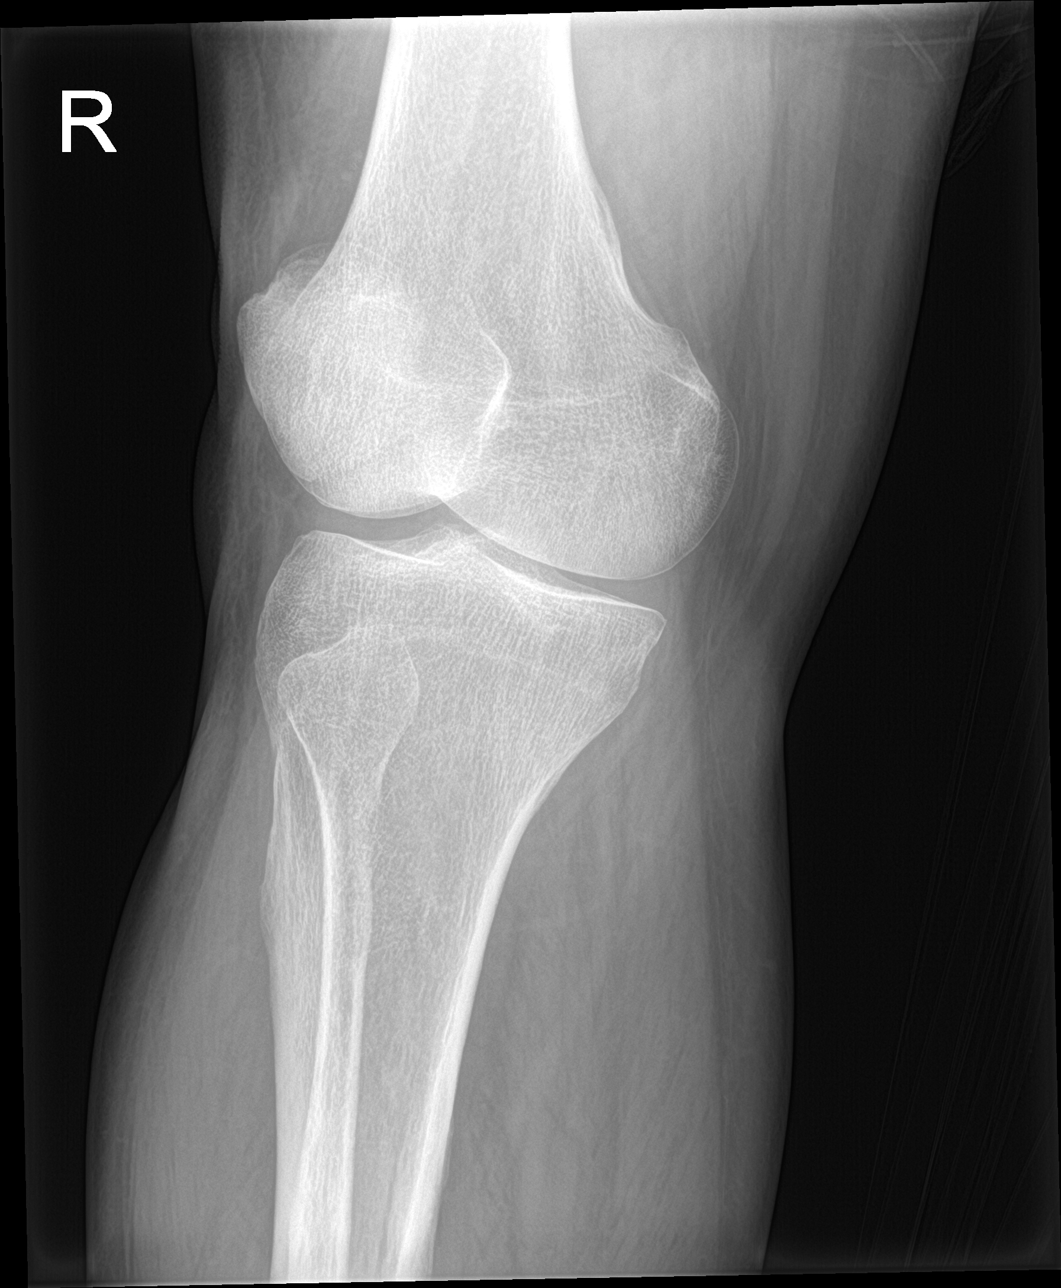

[knee obl (2 of 2)]
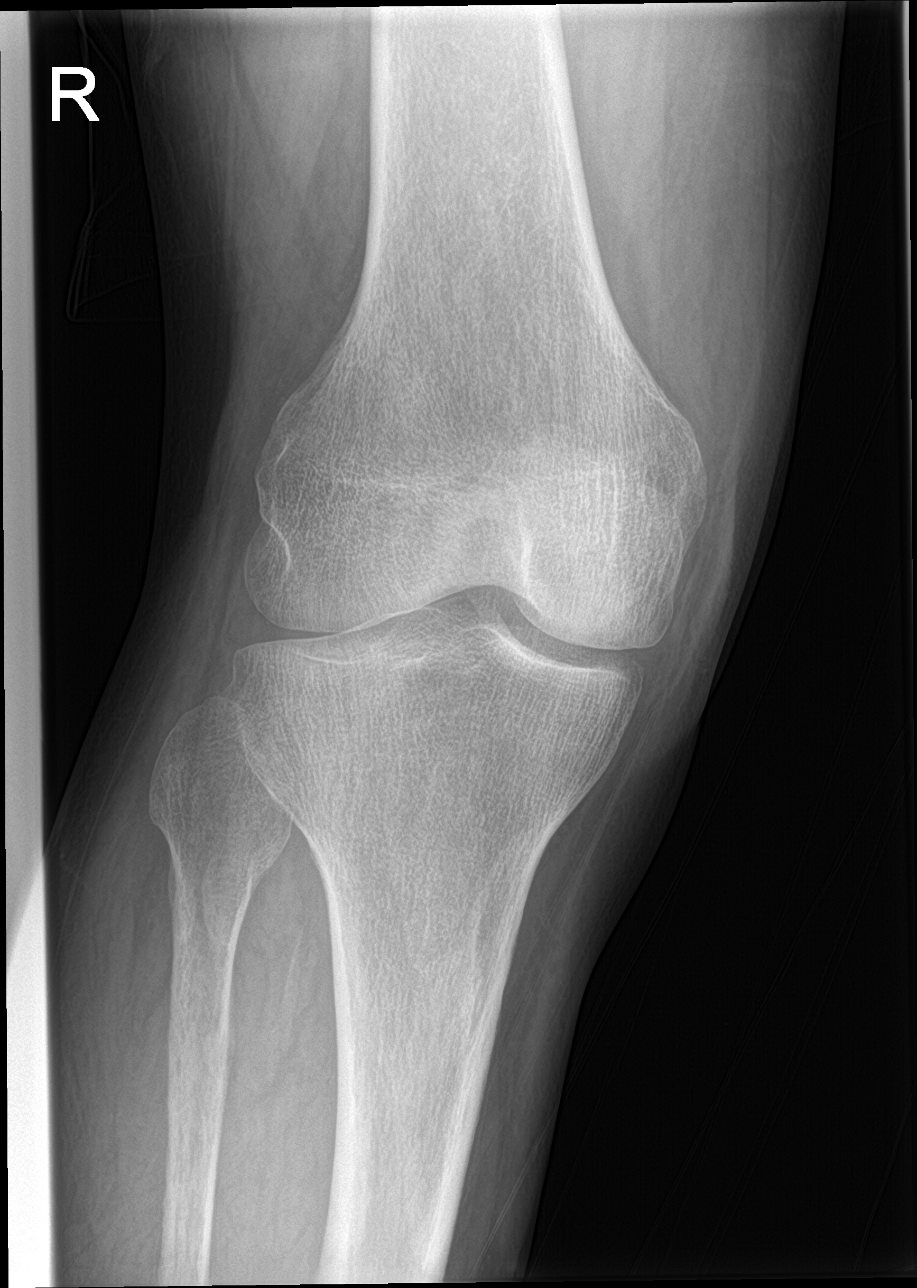

[4 of 4 positions shown; findings below may reference images not displayed]

FINDINGS: No evidence of fracture, dislocation, or joint effusion. Normal
alignment and joint spaces. Soft tissues are unremarkable.
IMPRESSION: Negative radiographs of the right knee.

## 2020-11-01 IMAGING — US US EXTREM LOW VENOUS*L*
1 series · 14 of 24 positions shown · non-contrast
Comparison: [DATE]

CLINICAL DATA: Lower extremity edema, LEFT leg pain, fell this
morning

EXAM:
LEFT LOWER EXTREMITY VENOUS DOPPLER ULTRASOUND
TECHNIQUE: Gray-scale sonography with compression, as well as color and duplex
ultrasound, were performed to evaluate the deep venous system(s)
from the level of the common femoral vein through the popliteal and
proximal calf veins.

[Series 1: us extrem low venous*left* · 14 of 42 slices shown]
[im 1/42]
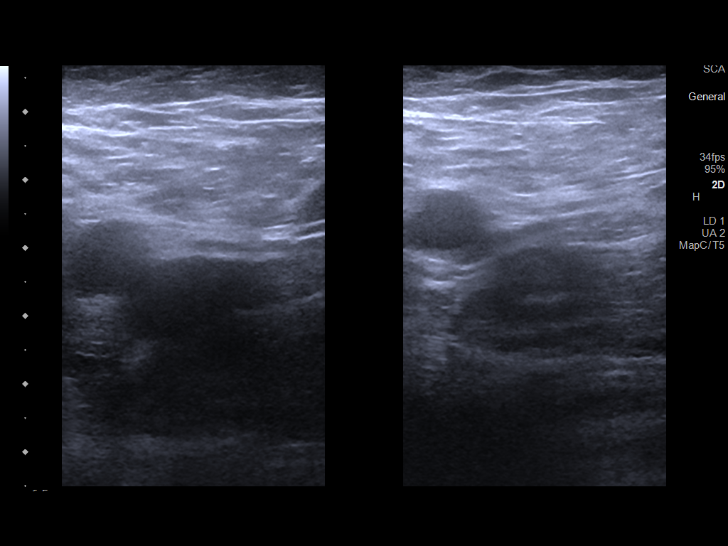
[im 4/42]
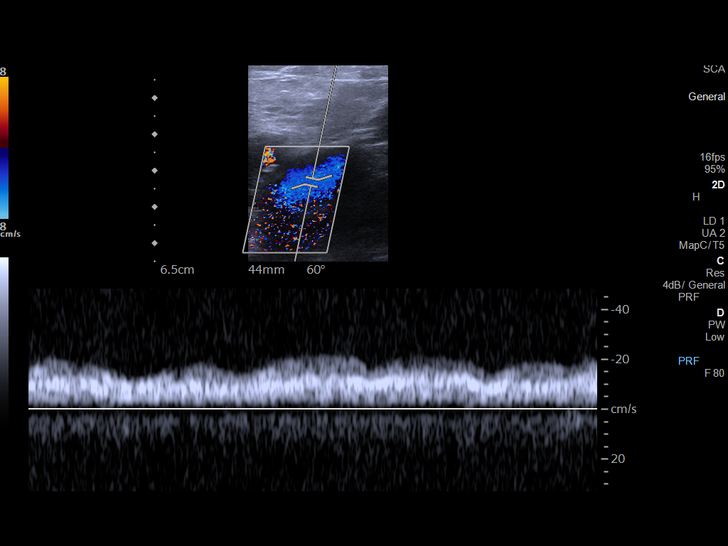
[im 8/42]
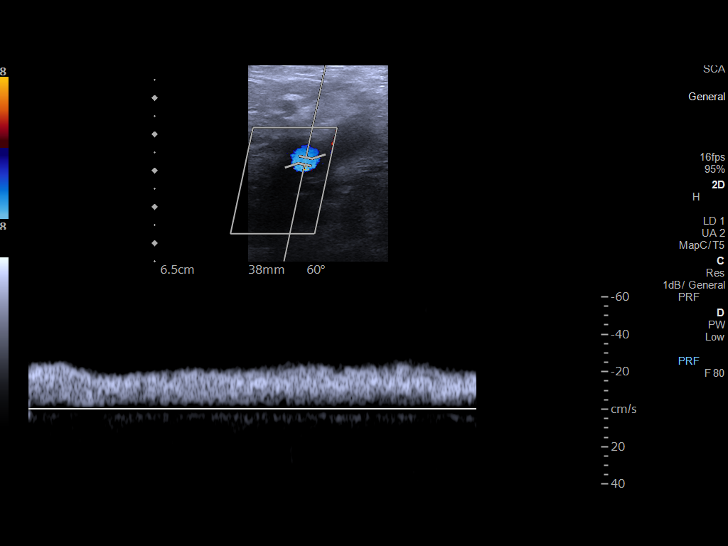
[im 11/42]
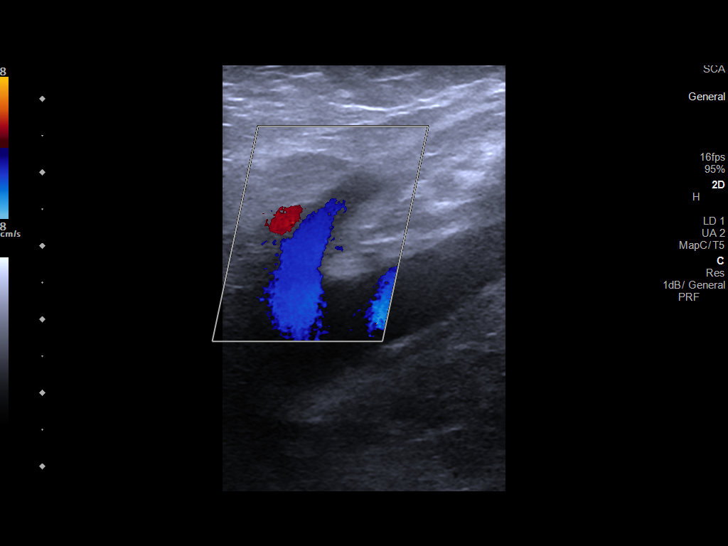
[im 13/42]
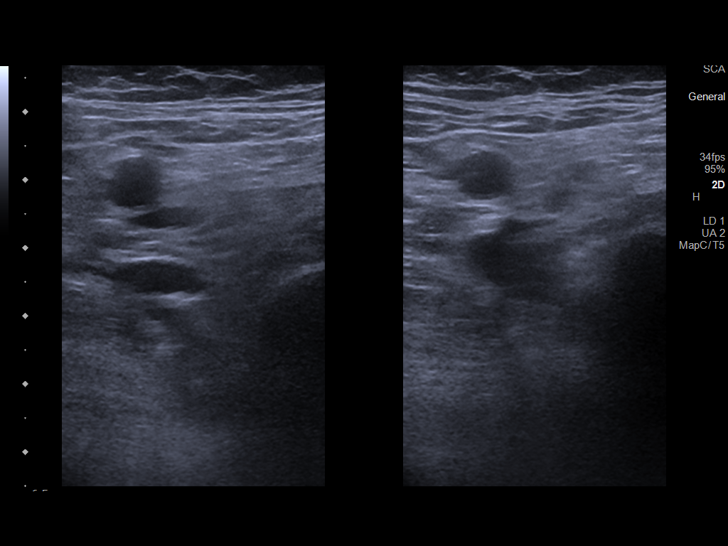
[im 17/42]
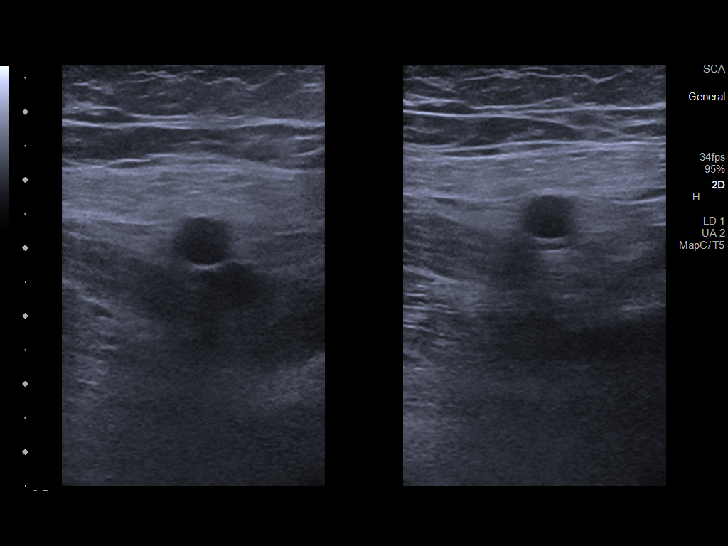
[im 20/42]
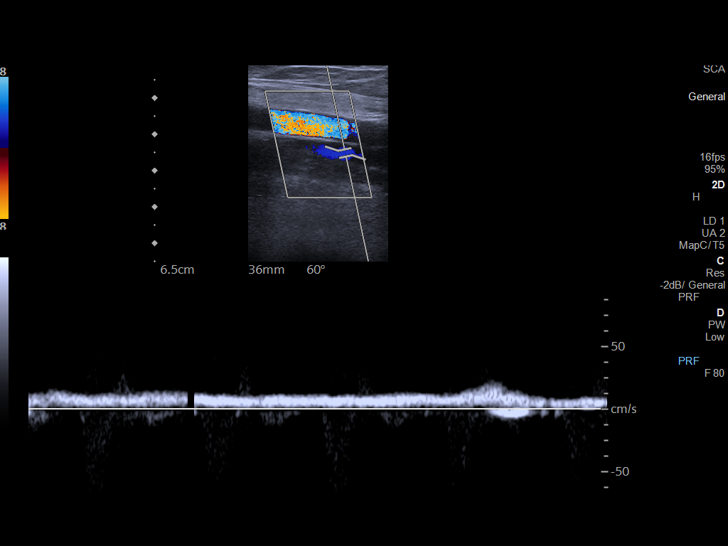
[im 22/42]
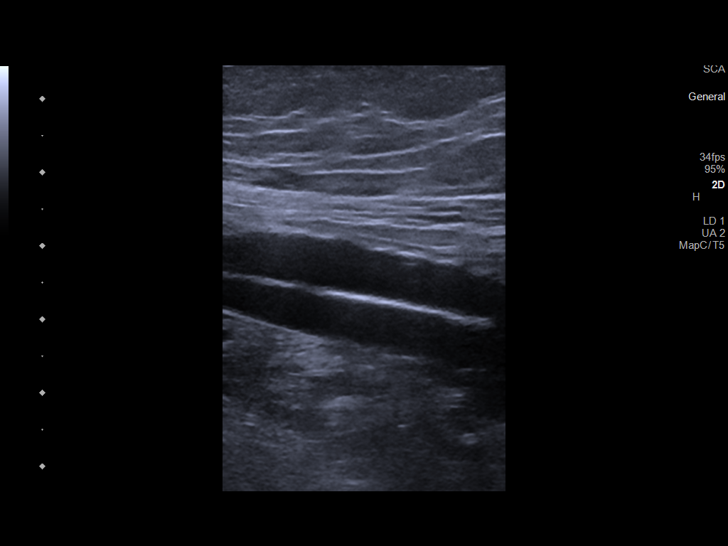
[im 25/42]
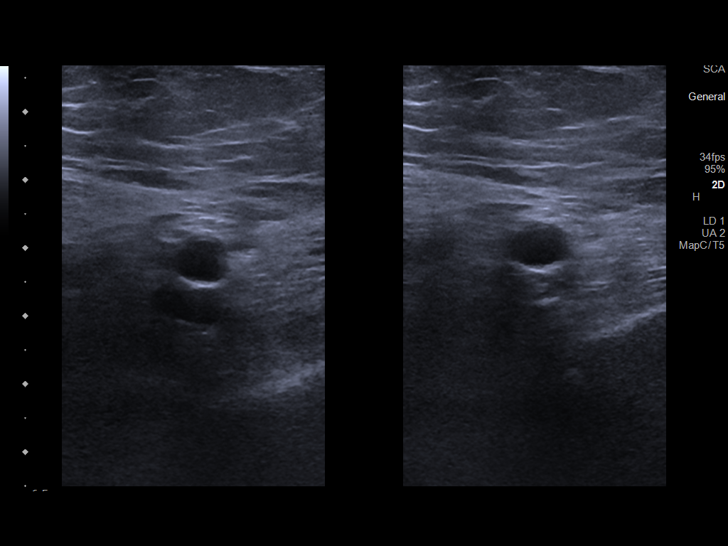
[im 29/42]
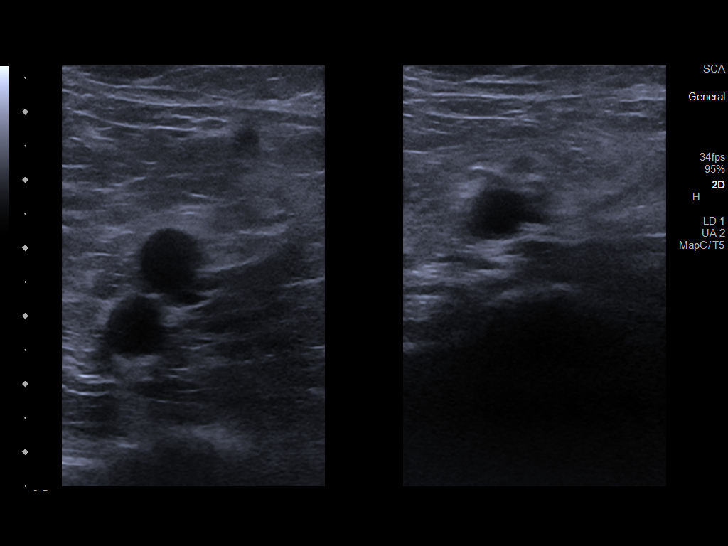
[im 33/42]
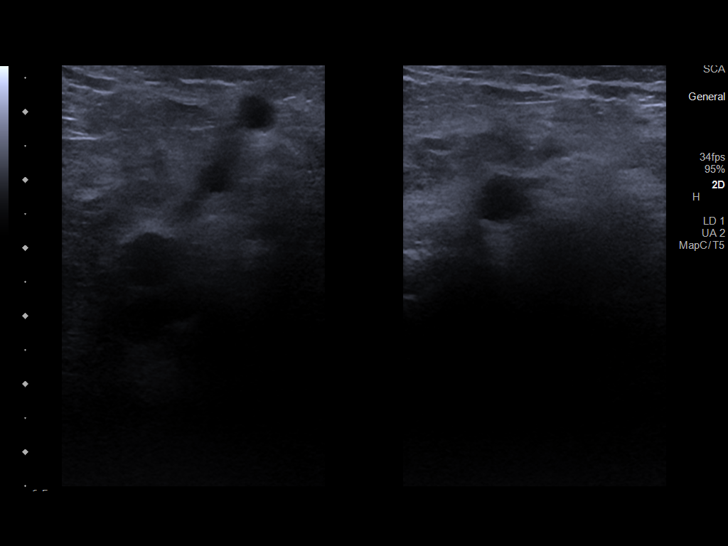
[im 34/42]
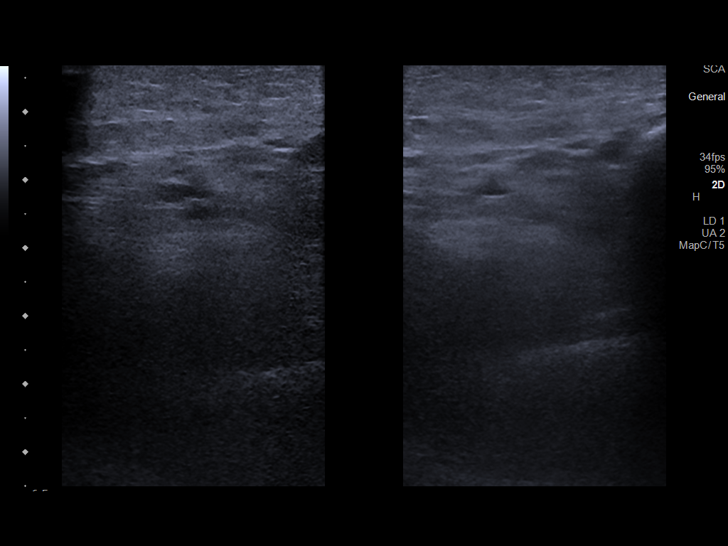
[im 38/42]
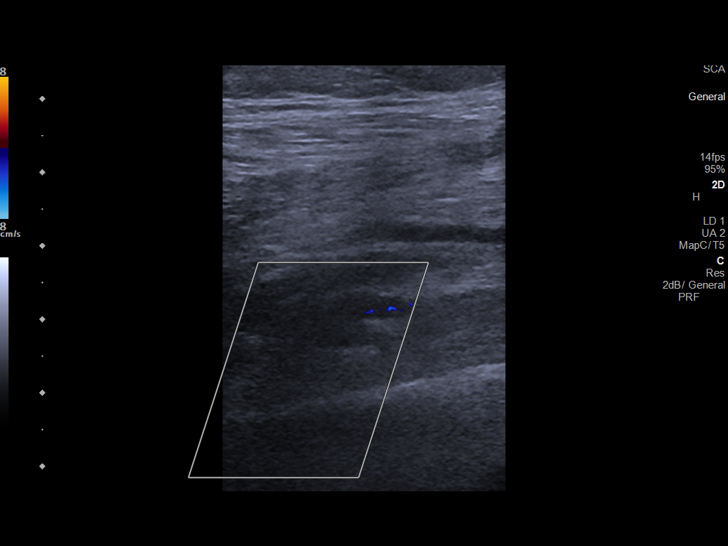
[im 42/42]
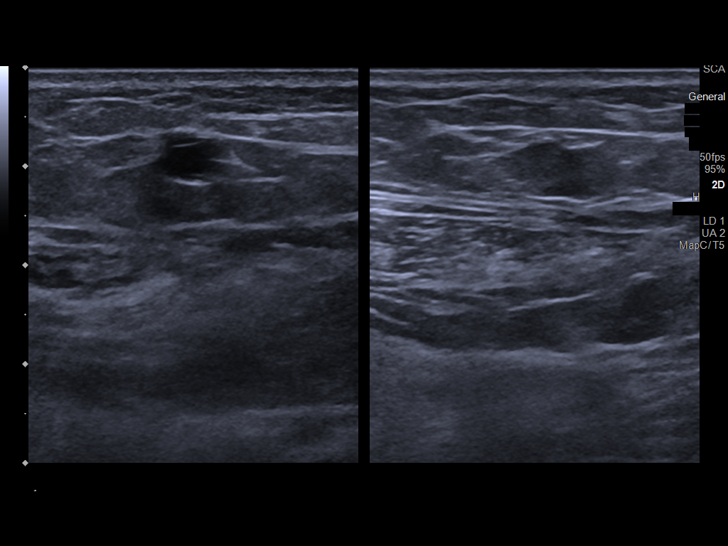

[14 of 24 positions shown; findings below may reference images not displayed]

FINDINGS: VENOUS

Normal compressibility of the common femoral, superficial femoral,
and popliteal veins, as well as the visualized calf veins.
Visualized portions of profunda femoral vein and great saphenous
vein unremarkable. No filling defects to suggest DVT on grayscale or
color Doppler imaging. Doppler waveforms show normal direction of
venous flow, normal respiratory plasticity and response to
augmentation.

Limited views of the contralateral common femoral vein are
unremarkable.

OTHER

None.

Limitations: none
IMPRESSION: No evidence of deep venous thrombosis in the LEFT lower extremity.

## 2020-11-01 IMAGING — DX DG LUMBAR SPINE COMPLETE 4+V
5 series · 5 of 5 positions shown · non-contrast
Comparison: Lumbar MRI [DATE]. radiograph [DATE]

CLINICAL DATA: Fall this morning getting out of the tub.

EXAM:
LUMBAR SPINE - COMPLETE 4+ VIEW

[l-spine ap]
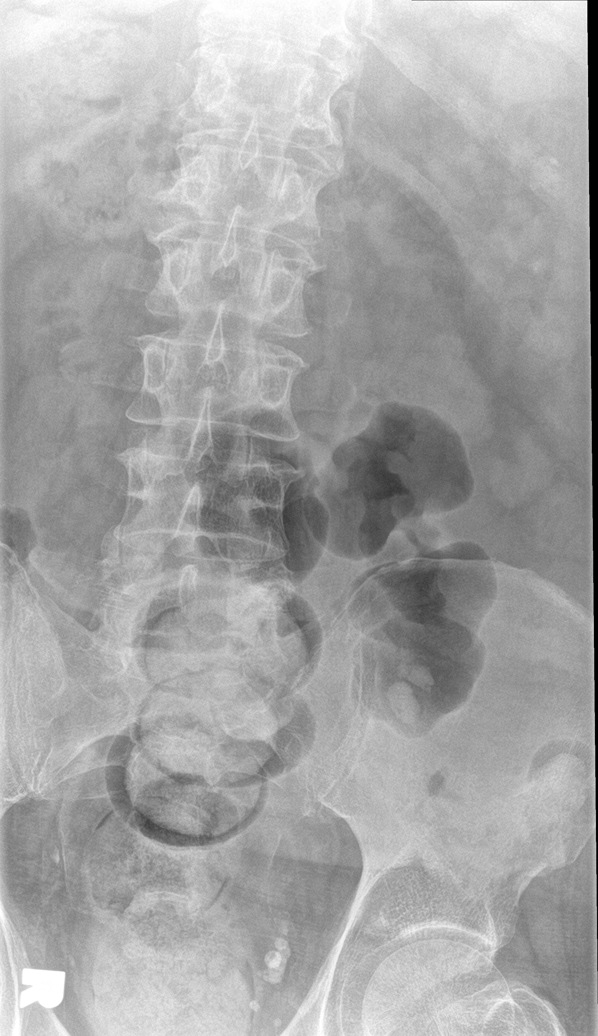

[l-spine obl (1 of 2)]
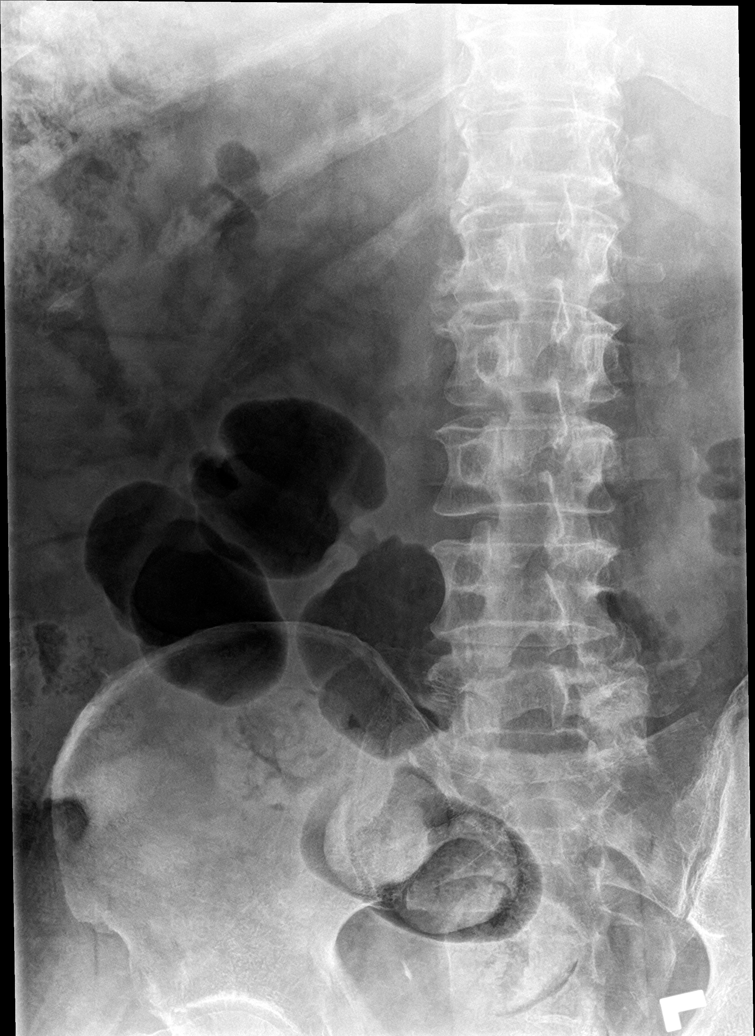

[l-spine obl (2 of 2)]
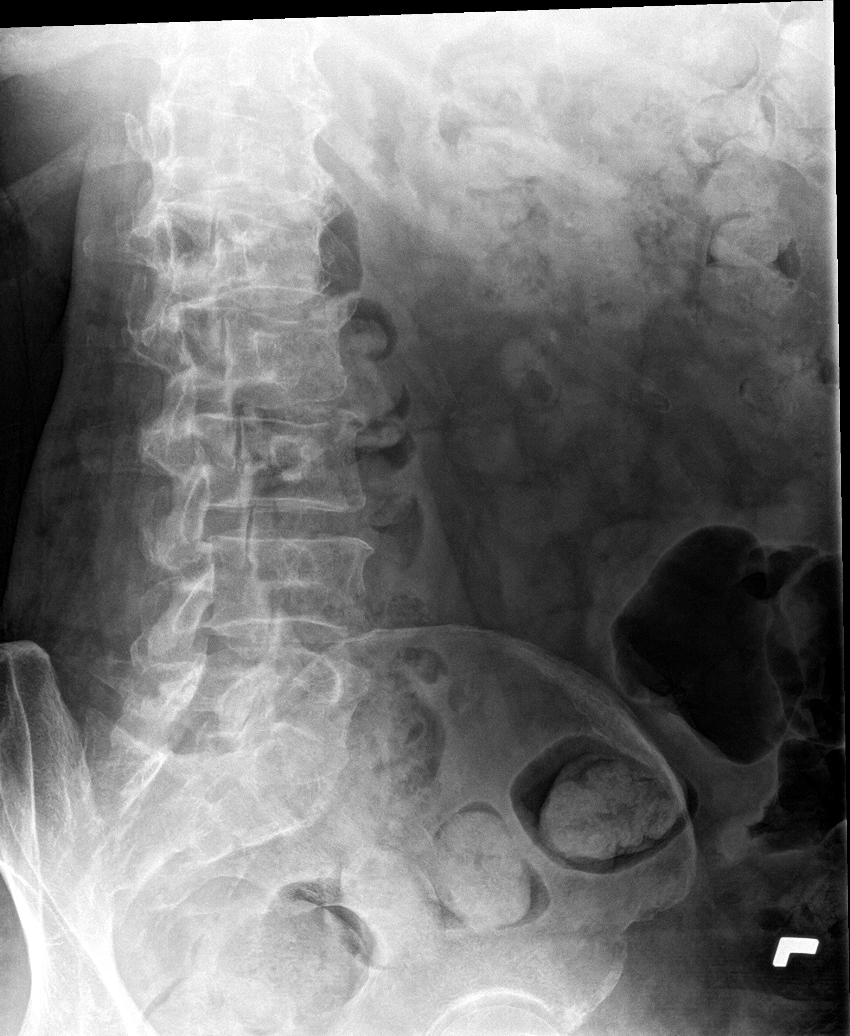

[l-spine lat]
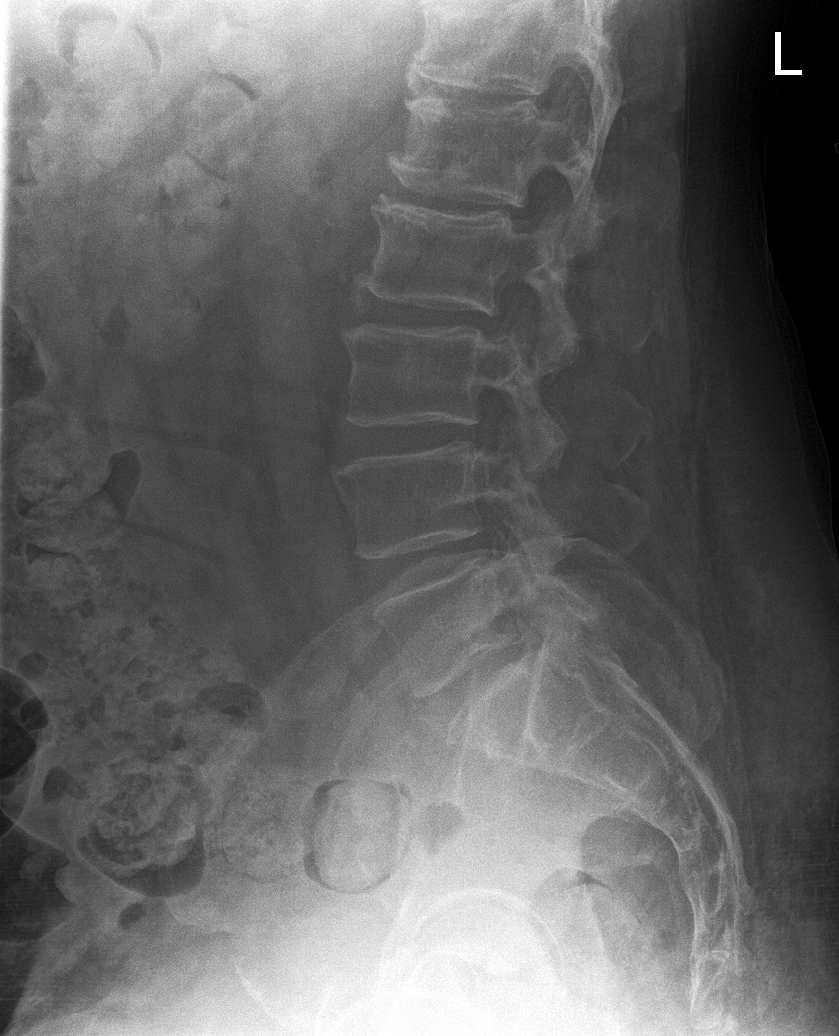

[l-spine spot]
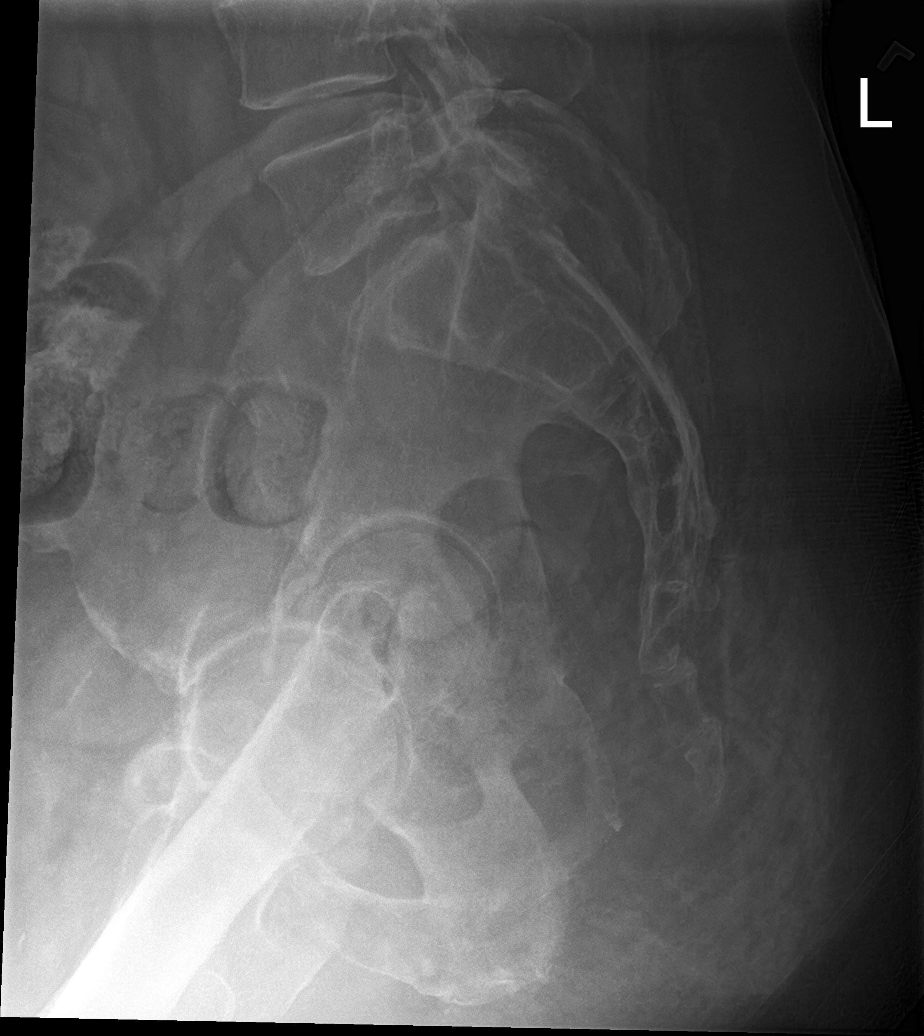

[5 of 5 positions shown; findings below may reference images not displayed]

FINDINGS: The alignment is maintained. Vertebral body heights are normal. No
fracture. Endplate spurring at L1-L2, with additional spurring
throughout. Mild L1-L2 and L2-L3 disc space narrowing. Lower lumbar
facet hypertrophy. Sacroiliac joints are congruent.
IMPRESSION: 1. No acute fracture or subluxation of the lumbar spine.
2. Stable degenerative change.

## 2020-11-01 IMAGING — DX DG CHEST 2V
2 series · 2 of 2 positions shown · non-contrast
Comparison: Radiograph yesterday.

CLINICAL DATA: Lower extremity edema. Fall this morning getting out
of the tub. Shortness of breath.

EXAM:
CHEST - 2 VIEW

[chest pa]
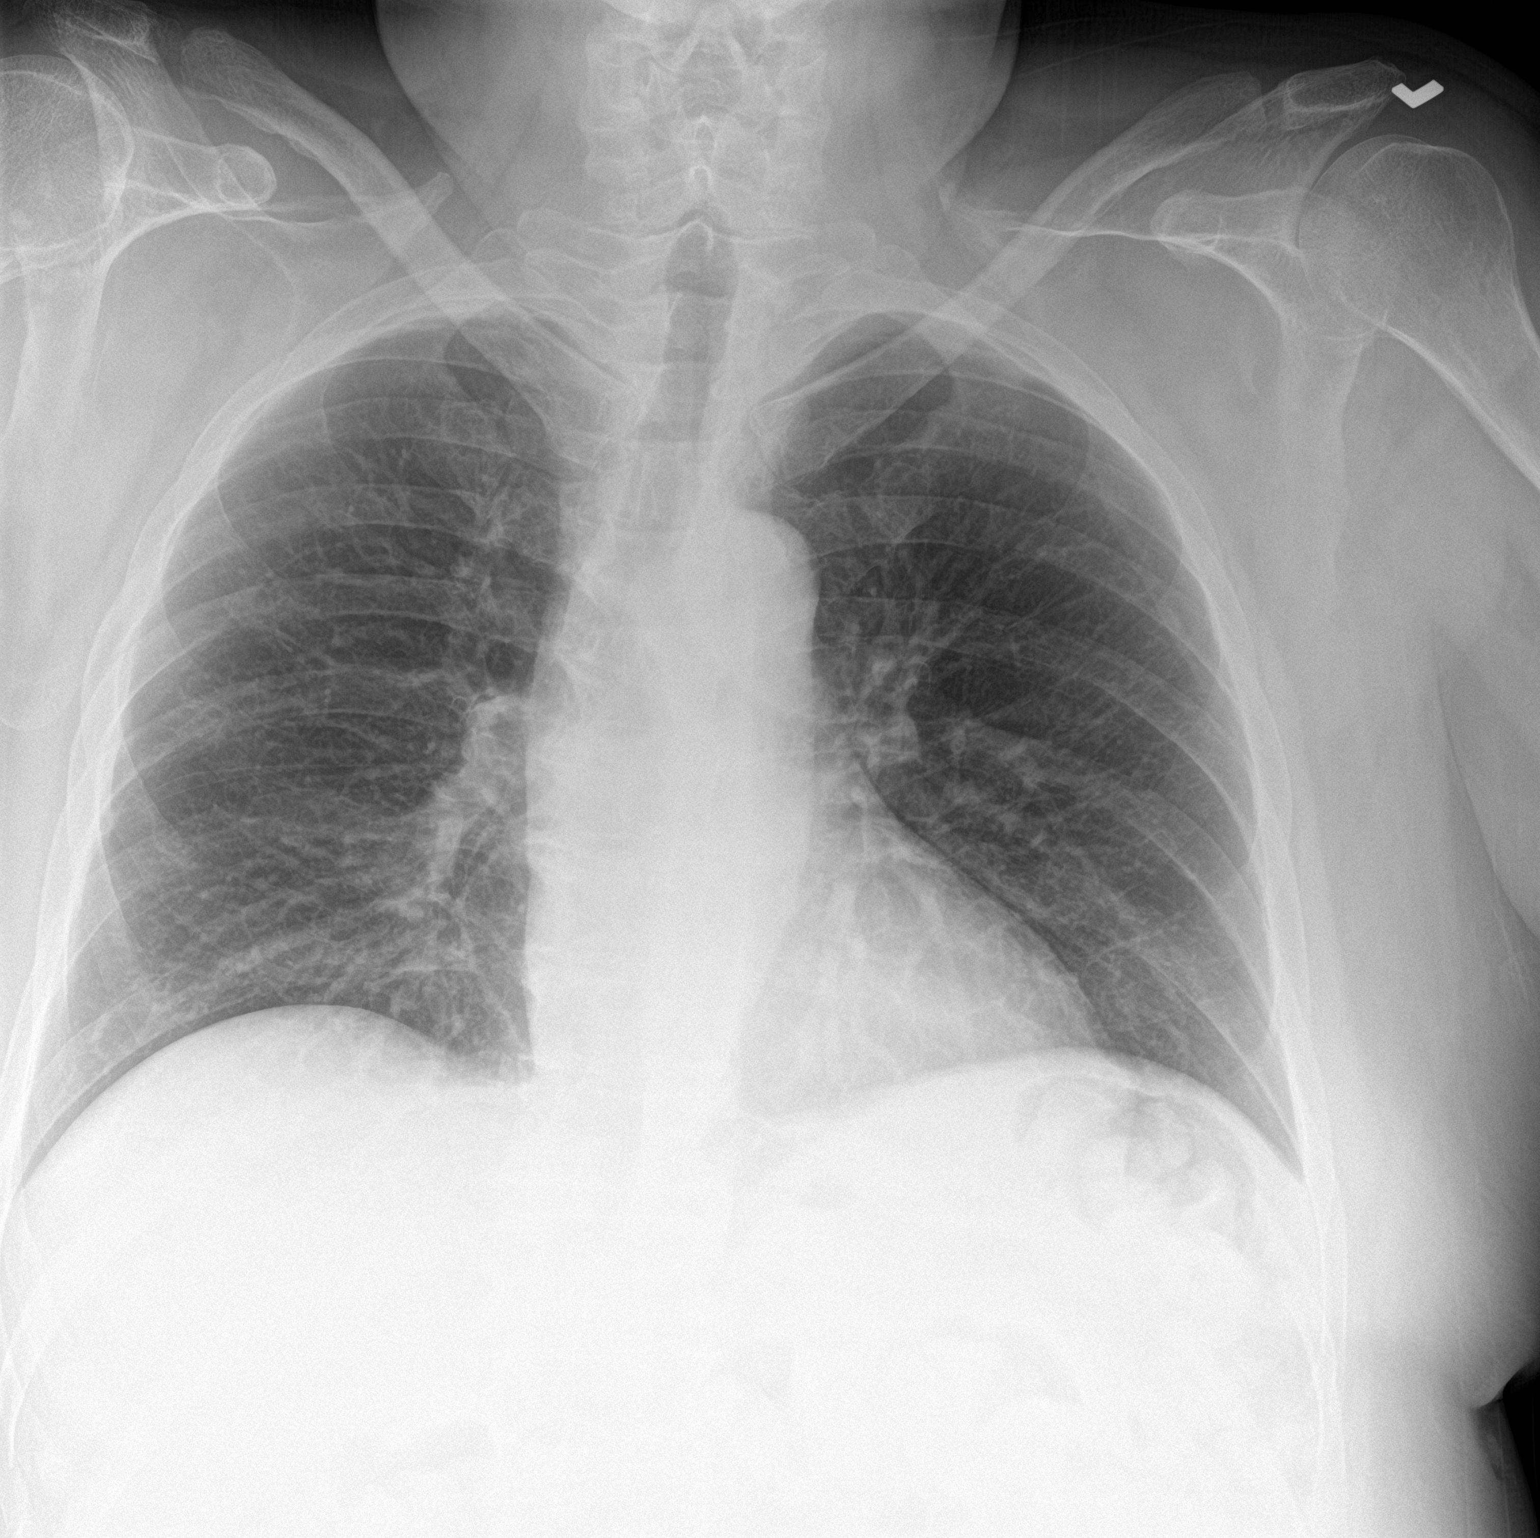

[chest lat]
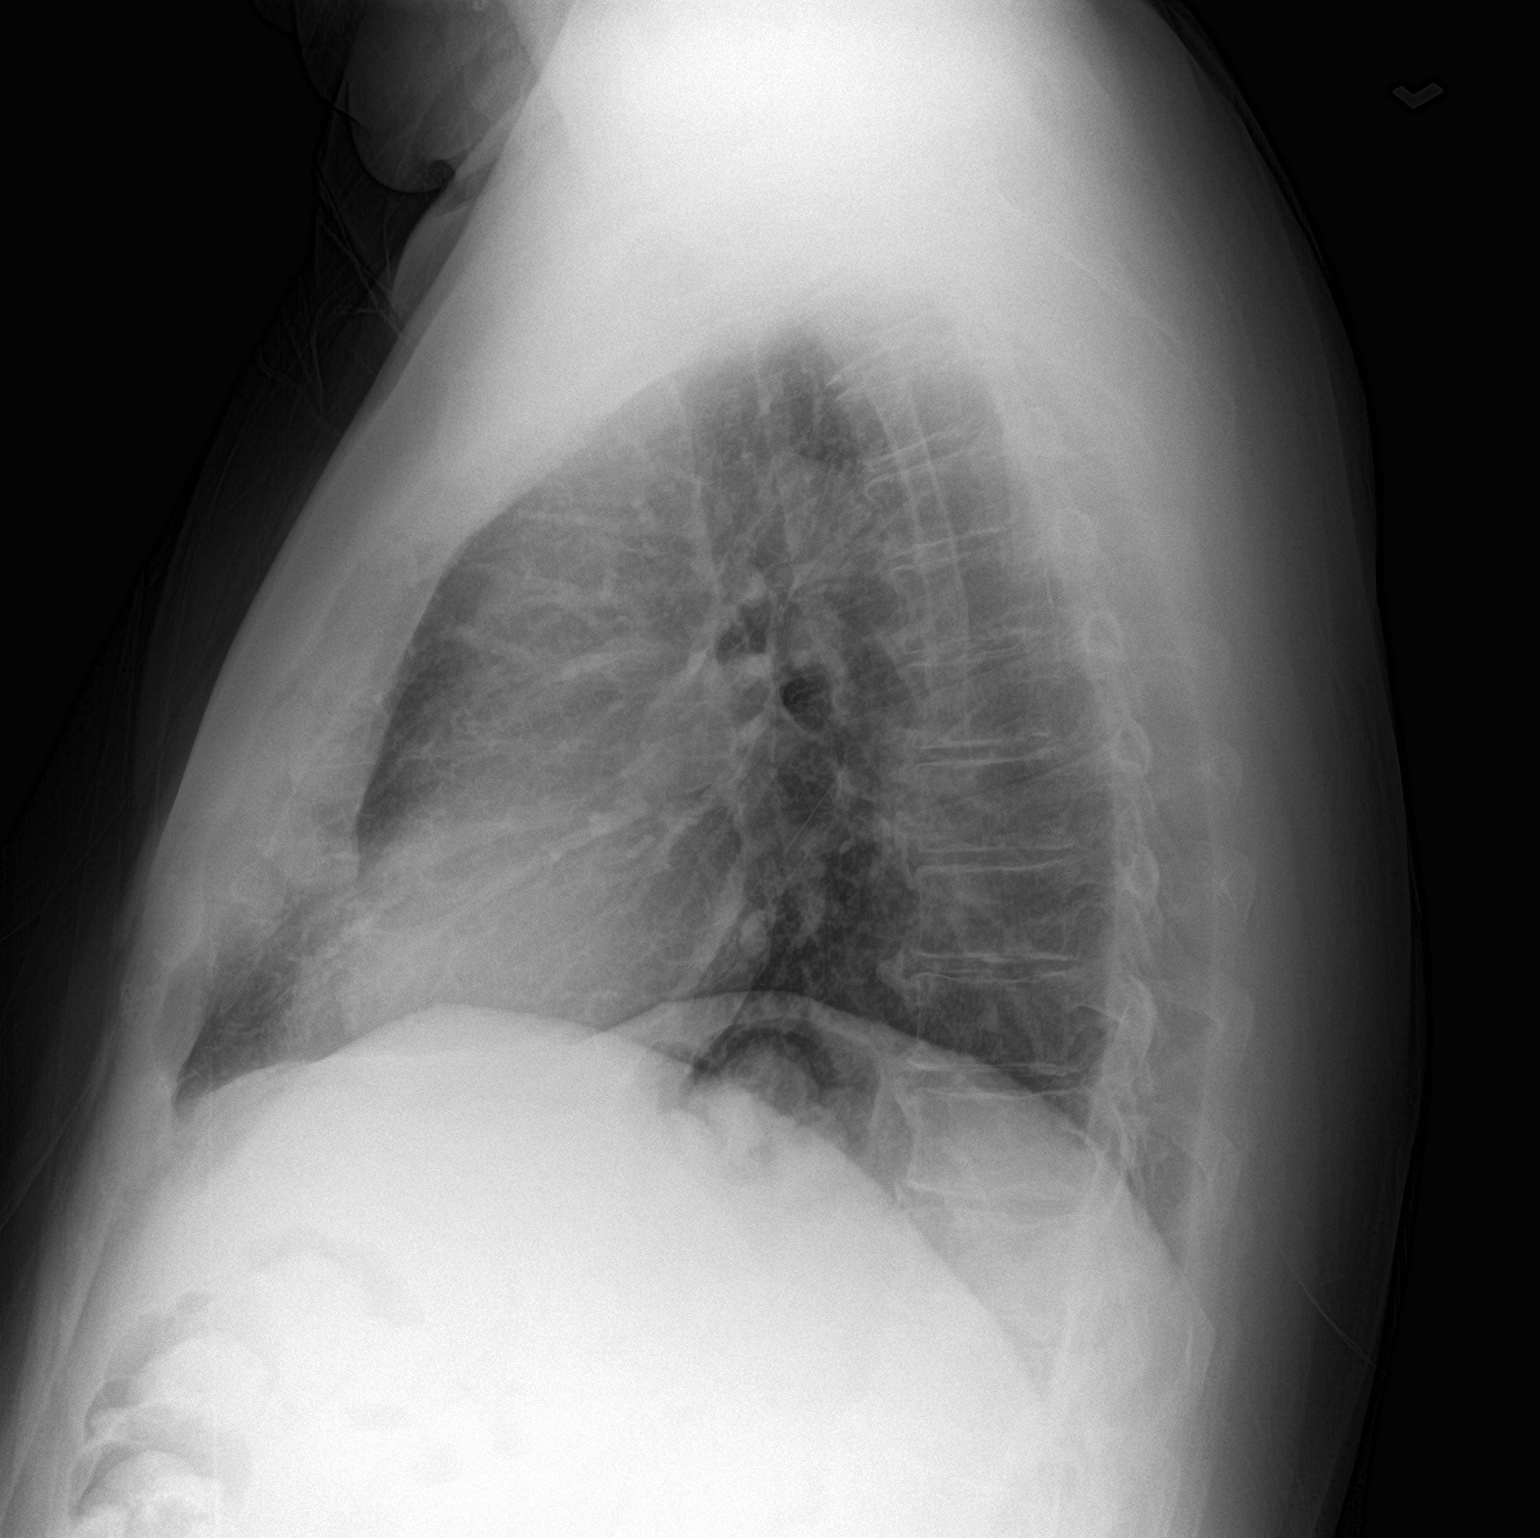

[2 of 2 positions shown; findings below may reference images not displayed]

FINDINGS: The cardiomediastinal contours are normal. The lungs are clear.
Pulmonary vasculature is normal. No consolidation, pleural effusion,
or pneumothorax. No acute osseous abnormalities are seen. Mild
thoracic spondylosis.
IMPRESSION: No acute chest findings.

## 2020-11-01 IMAGING — CT CT HEAD W/O CM
3 series · 14 of 47 positions shown, 16 images · non-contrast
Comparison: [DATE]

CLINICAL DATA: Neck trauma. Fell getting out of bathtub hitting
head.

EXAM:
CT HEAD WITHOUT CONTRAST
CT CERVICAL SPINE WITHOUT CONTRAST
TECHNIQUE: Multidetector CT imaging of the head and cervical spine was
performed following the standard protocol without intravenous
contrast. Multiplanar CT image reconstructions of the cervical spine
were also generated.

[Series 2: head 5.0 h30s · axial · 0.44mm/px · z∈[-187,-52]mm · 8 of 33 slices shown, 10 images]
[im 3/33  brain]
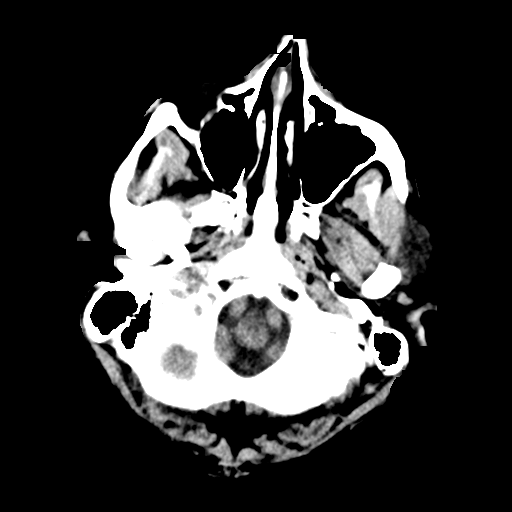
[im 3/33  bone]
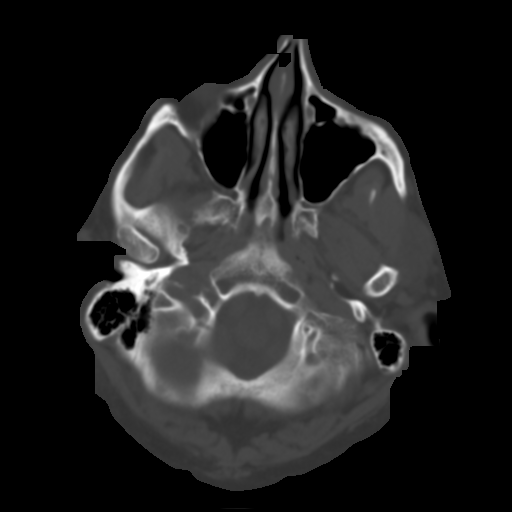
[im 7/33  brain]
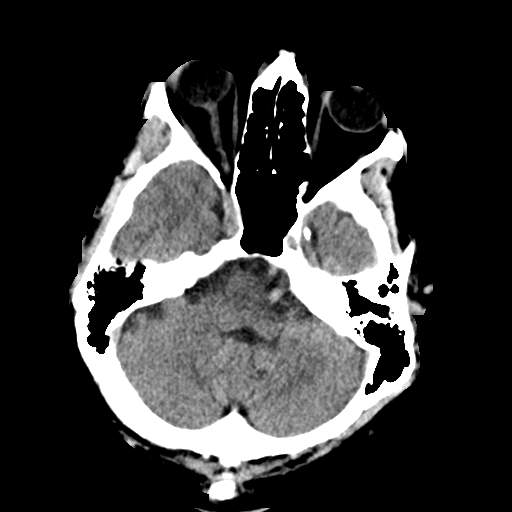
[im 10/33  brain]
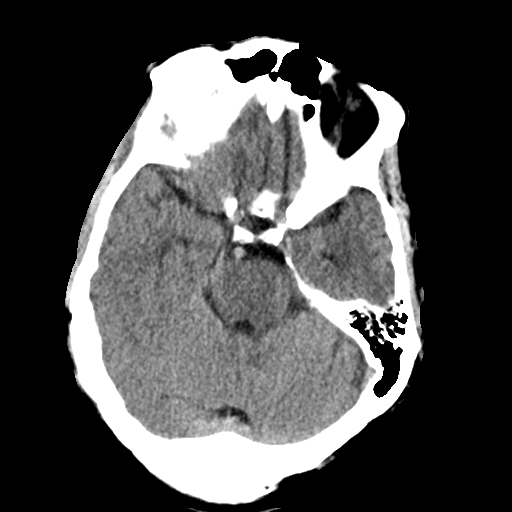
[im 15/33  brain]
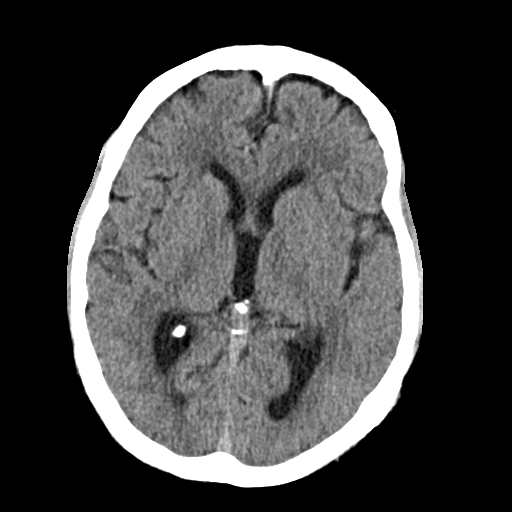
[im 18/33  brain]
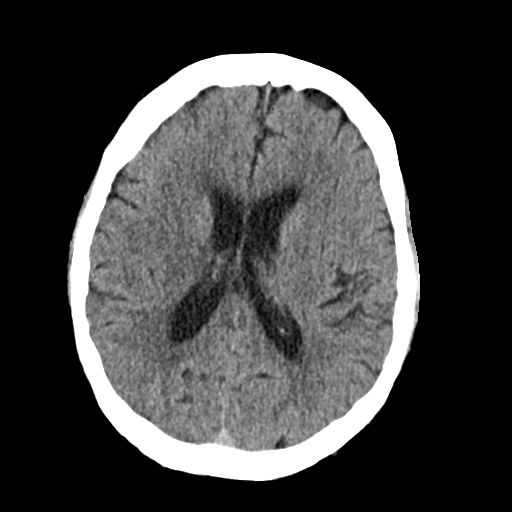
[im 18/33  bone]
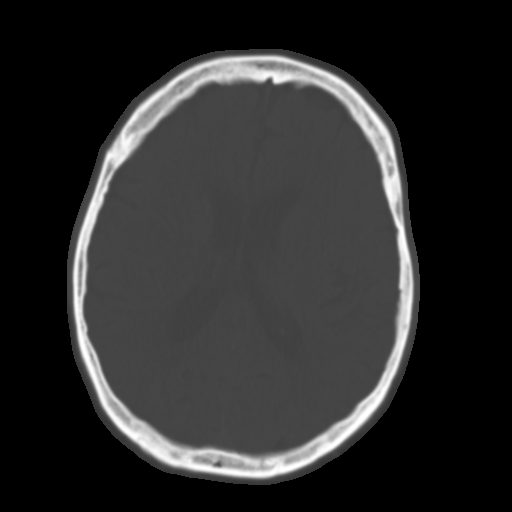
[im 23/33  brain]
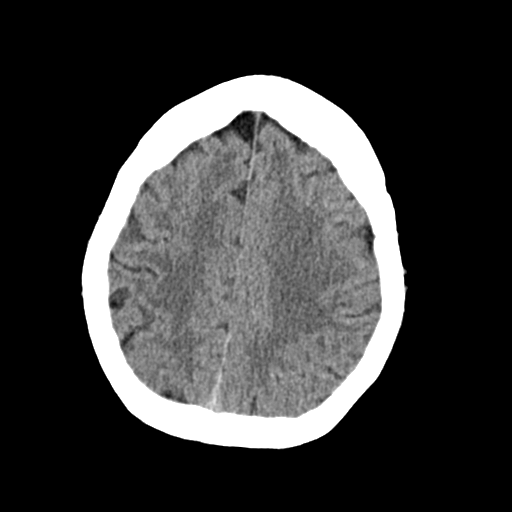
[im 26/33  brain]
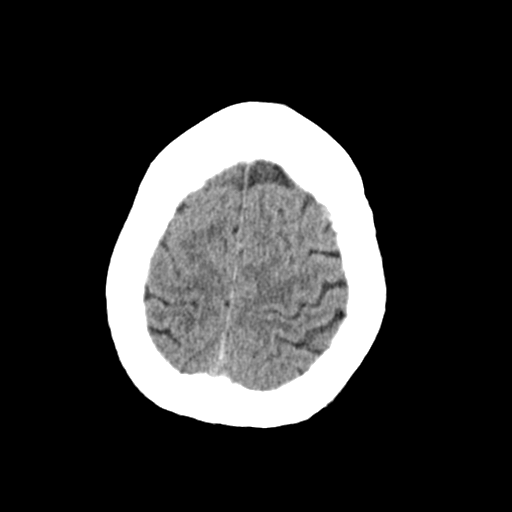
[im 30/33  brain]
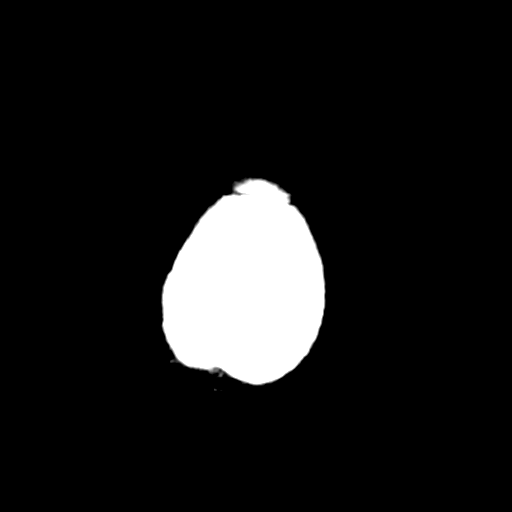

[Series 4: head 3.0 mpr cor · coronal · 0.35mm/px · 3 of 74 slices shown]
[im 25/74  brain]
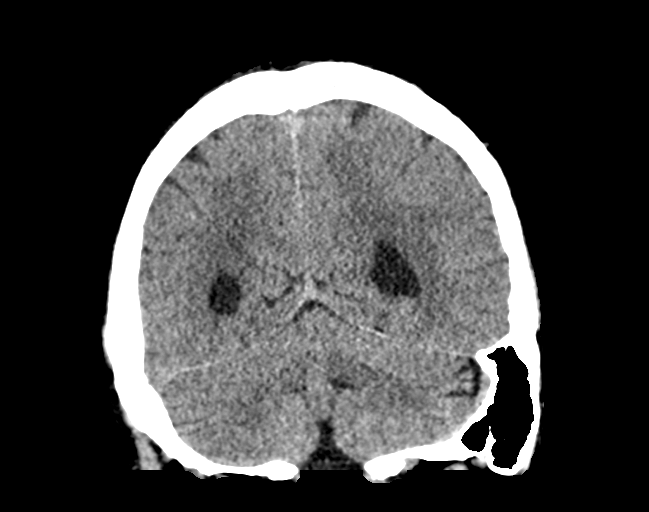
[im 33/74  brain]
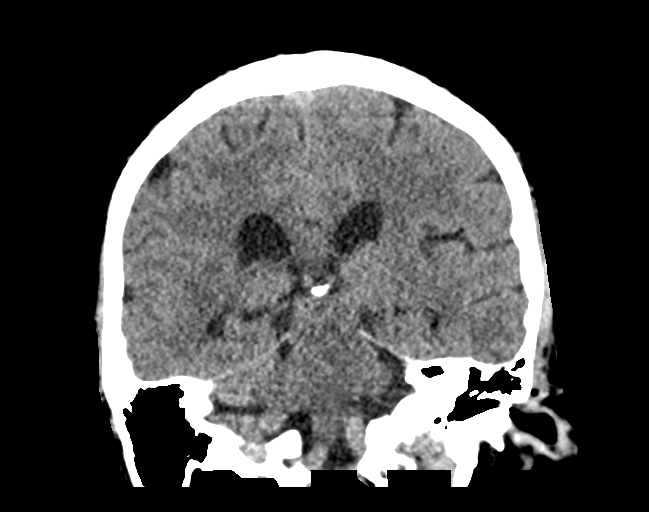
[im 41/74  brain]
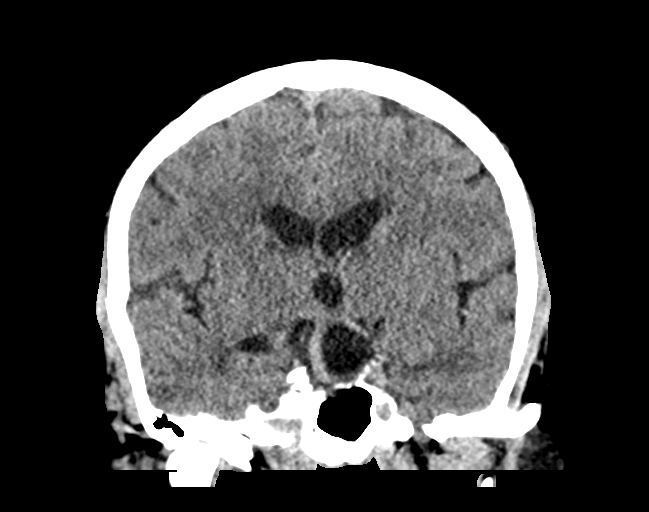

[Series 5: head 3.0 mpr sag · sagittal · 0.32mm/px · 3 of 67 slices shown]
[im 23/67  brain]
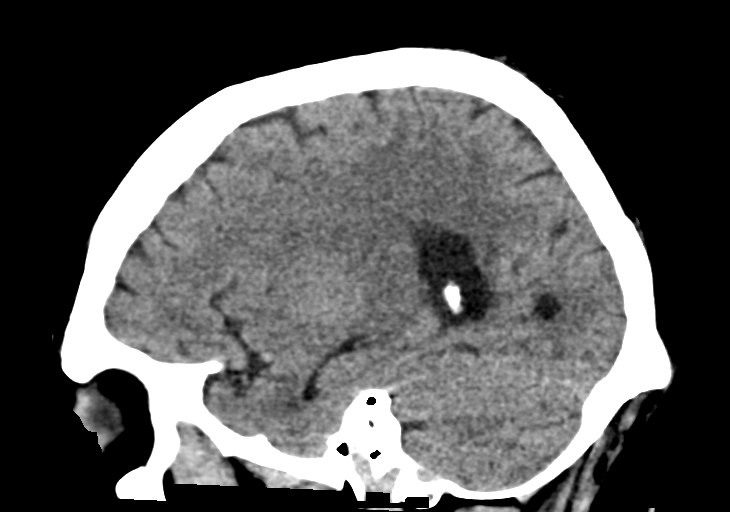
[im 34/67  brain]
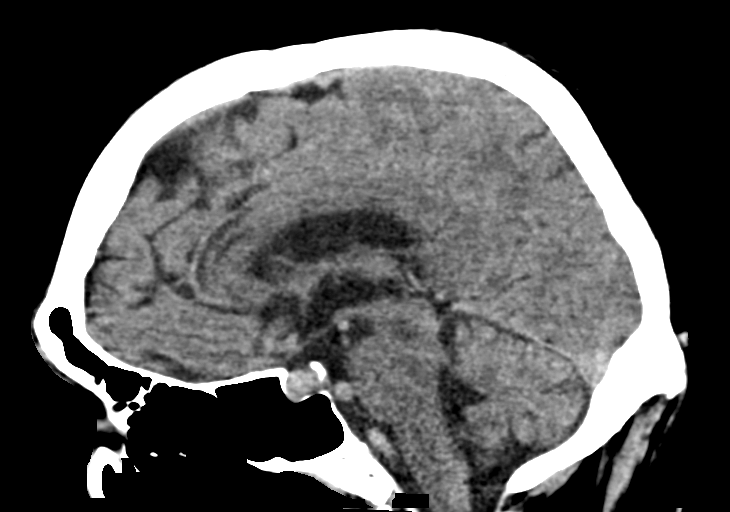
[im 45/67  brain]
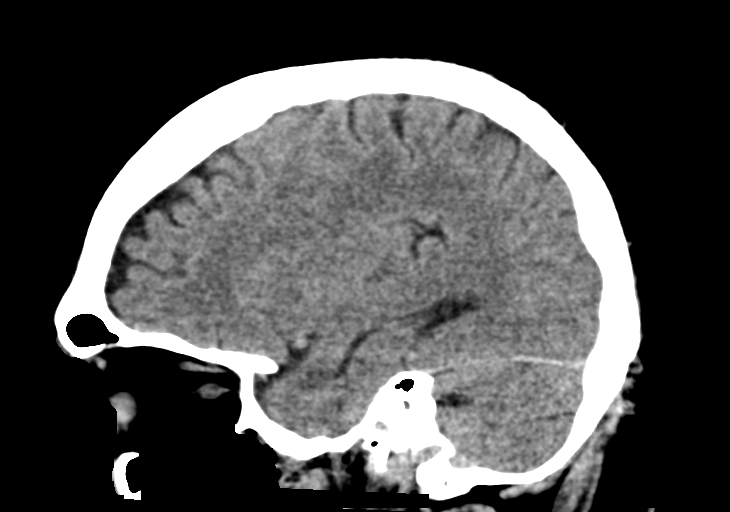

[14 of 47 positions shown; findings below may reference images not displayed]

FINDINGS: CT HEAD FINDINGS

Brain: No evidence of acute infarction, hemorrhage, hydrocephalus,
extra-axial collection or mass lesion/mass effect. There is mild
diffuse low-attenuation within the subcortical and periventricular
white matter compatible with chronic microvascular disease. Remote
lacunar infarct within the right frontal lobe periventricular white
matter, image [DATE]. This is new when compared with [DATE]

Vascular: No hyperdense vessel or unexpected calcification.

Skull: Normal. Negative for fracture or focal lesion.

Sinuses/Orbits: No acute finding.

Other: None

CT CERVICAL SPINE FINDINGS

Alignment: Normal alignment.

Skull base and vertebrae: The inferior margin of the C7 vertebra is
excluded from field of view. Imaged portions of the cervical
vertebra are intact without signs of acute fracture or dislocation.

Soft tissues and spinal canal: No prevertebral fluid or swelling. No
visible canal hematoma.

Disc levels: Mild disc space narrowing and endplate spurring noted
at C4-5 and C6-7

Upper chest: Negative.

Other: None
IMPRESSION: 1. No acute intracranial abnormalities.
2. Chronic small vessel ischemic change and brain atrophy.
3. No evidence for cervical spine fracture.
4. Mild cervical degenerative disc disease.

## 2020-11-01 IMAGING — DX DG HIP (WITH OR WITHOUT PELVIS) 5+V BILAT
5 series · 5 of 5 positions shown · non-contrast
Comparison: Left hip radiograph [DATE], hip MRI [DATE]

CLINICAL DATA: Fall this morning getting out of the tub. Bilateral
hip pain.

EXAM:
DG HIP (WITH OR WITHOUT PELVIS) 5+V BILAT

[pelvis ap]
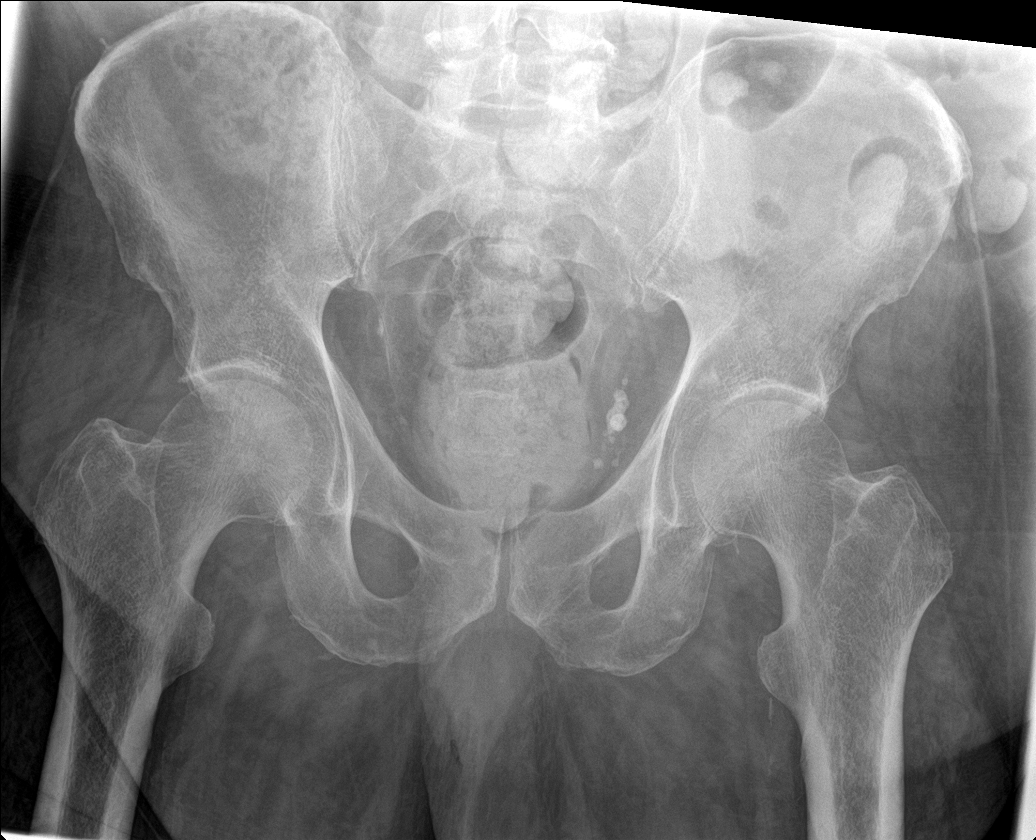

[hip ap (1 of 2)]
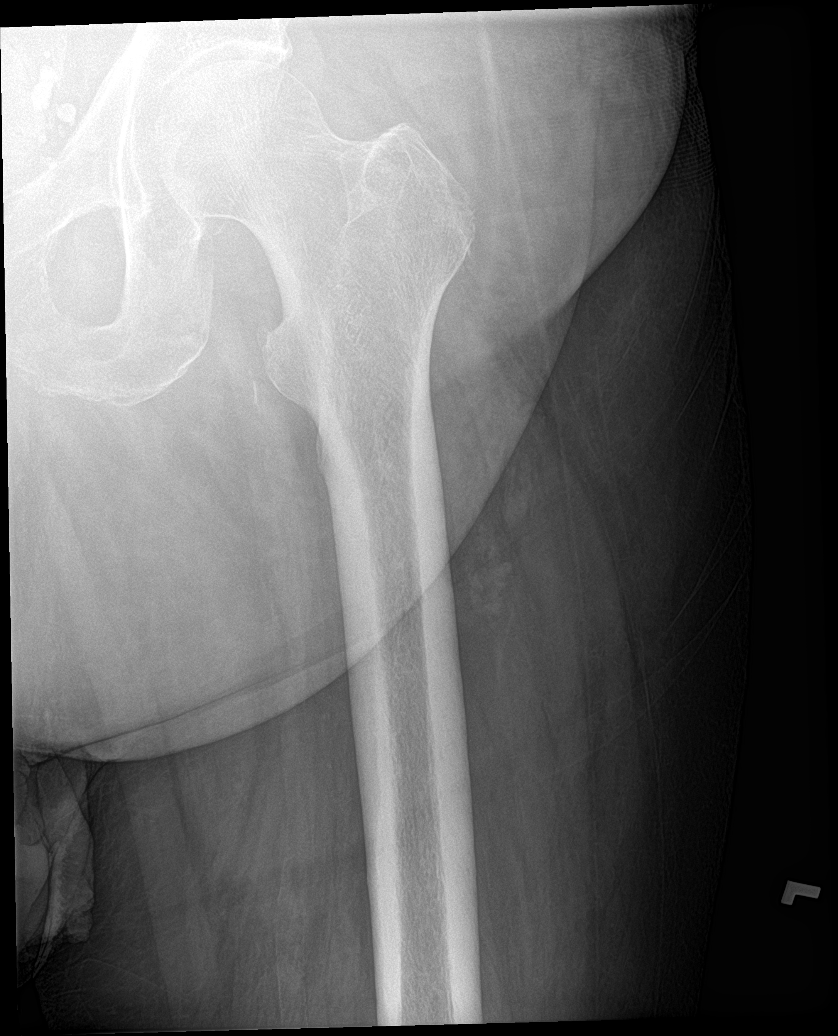

[hip ap (2 of 2)]
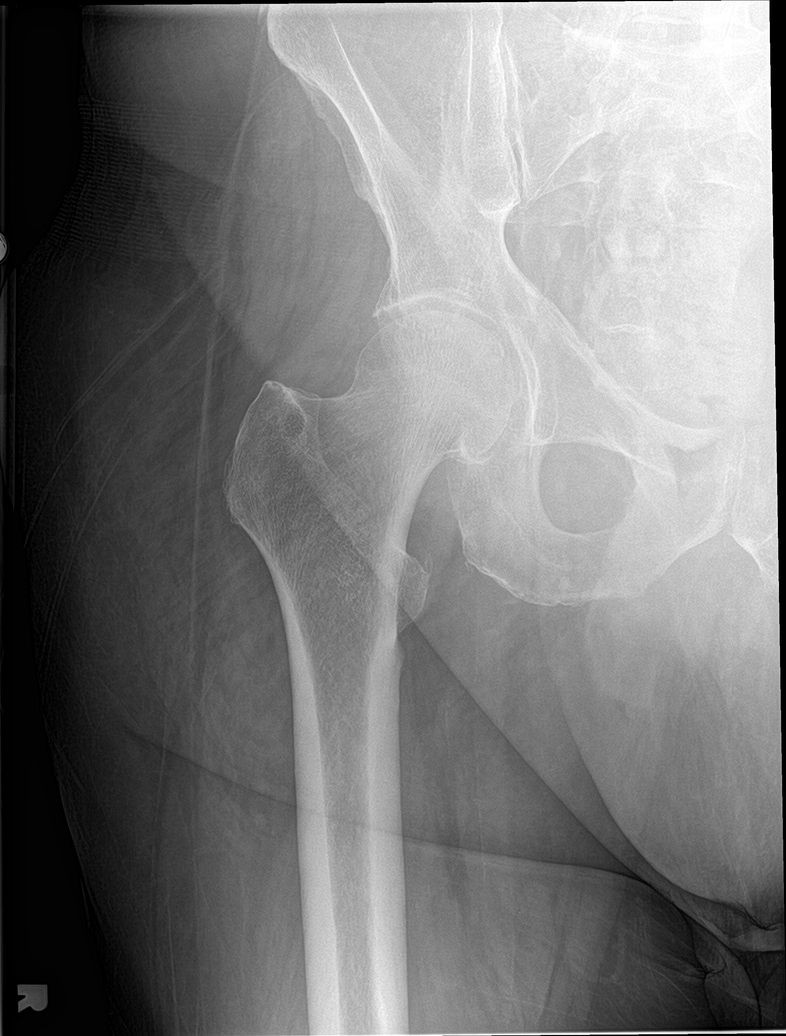

[hip lat (1 of 2)]
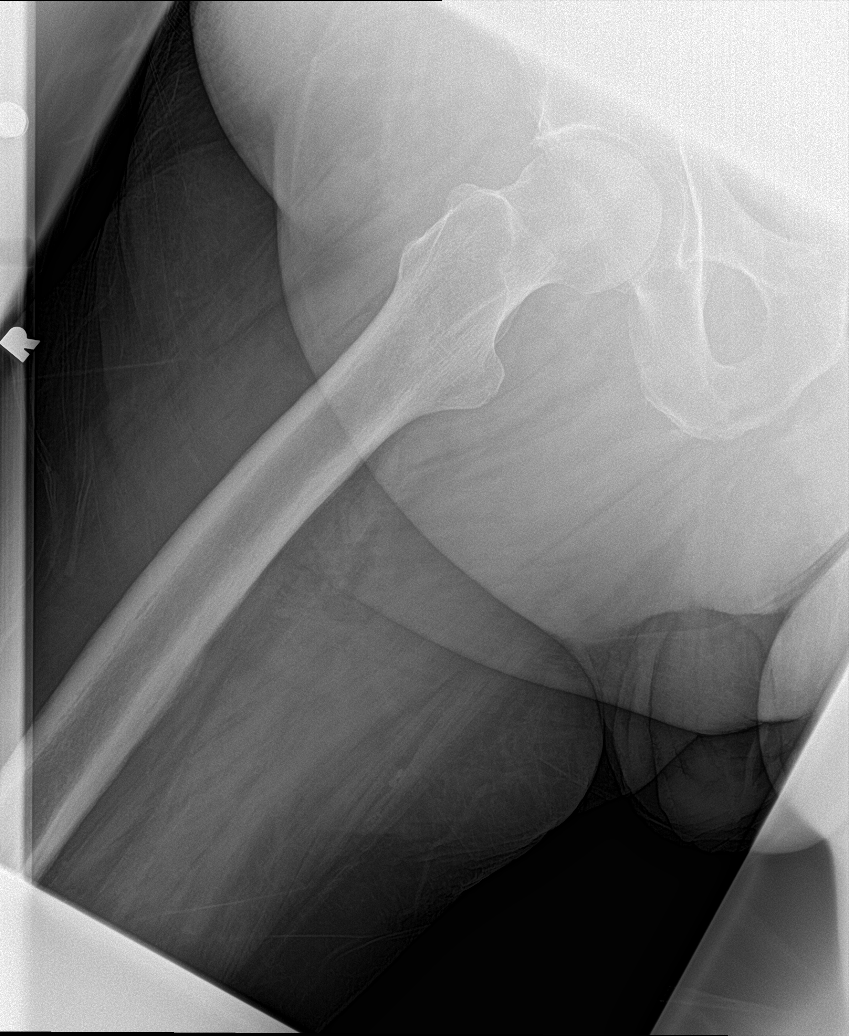

[hip lat (2 of 2)]
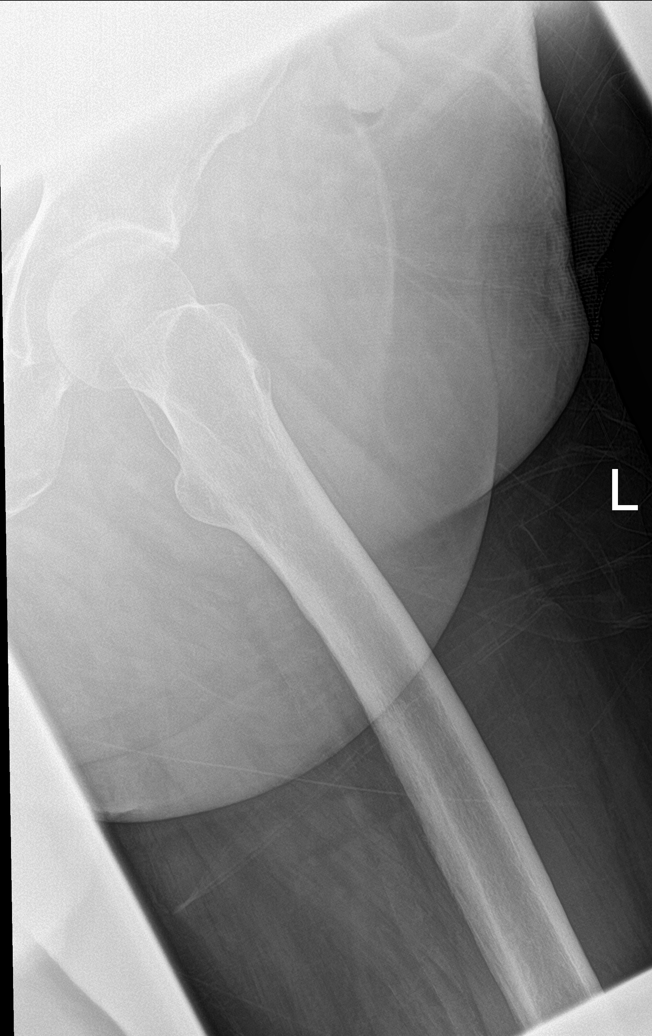

[5 of 5 positions shown; findings below may reference images not displayed]

FINDINGS: The cortical margins of the bony pelvis are intact. No fracture.
Pubic symphysis and sacroiliac joints are congruent. Both femoral
heads are well-seated in the respective acetabula. Mild bilateral
acetabular spurring with preservation of joint spaces.
IMPRESSION: No fracture of the pelvis or hips.

## 2020-11-01 MED ORDER — HYDROCODONE-ACETAMINOPHEN 5-325 MG PO TABS: 1.0000 | ORAL_TABLET | ORAL | 0 refills | Status: DC | PRN

## 2020-11-01 MED ORDER — MORPHINE SULFATE (PF) 4 MG/ML IV SOLN
4.0000 mg | Freq: Once | INTRAVENOUS | Status: AC
Start: 2020-11-01 — End: 2020-11-01
  Administered 2020-11-01: 4 mg via INTRAMUSCULAR
  Filled 2020-11-01: qty 1

## 2020-11-01 NOTE — ED Provider Notes (Signed)
[facility]ERGENC[facility] DEPARTMENT Provider Note   CSN: [id number] Arrival date & time: 11/01/20  1444    History Chief Complaint  Patient presents with  . Fall    Tyler Brewer is a 60 y.o. male with past medical history significant for chronic pain, generalized weakness currently being worked up by neurology who presents for evaluation after mechanical fall.  Was getting at the shower earlier this morning when he slipped and fell on [address]..  Aggravated posterior aspect of his head, cervical spine, lumbar spine.  He has pain to his bilateral hips as well as his right knee.  Took his home tramadol as well as muscle relaxer, woke up with continued pain came here to emergency department.  Of note patient was seen here yesterday for syncopal event.  Patient is adamant that he did not have additional syncopal event but this was purely a slip and fall.  He did not have any imaging of his head yesterday.  No preceding chest pain, shortness of breath, lightheadedness or dizziness.  Patient walks with a cane at baseline.  Was able to walk when EMS got to his house earlier today.  He denies headache, LOC, lightheadedness, dizziness, chest pain, shortness breath abdominal pain, diarrhea, dysuria, paresthesias or increased weakness.   On exam patient is noted to have left lower extremity edema.  States this is been present for 3 to 4 weeks.  He has not had an ultrasound for this.  He has no chest pain, shortness of breath.  No redness, swelling, warmth.   Denies additional aggravating or alleviating factors.  Last neurology note patient does have chronic paresthesias and weakness to his lower extremities.  He is supposed to have LP.  Did review his prior MR imaging of thoracic and lumbar spine which did not show any evidence of compression lesions.  States he symptoms have not worsened since he last saw neurology.  History obtained from patient and past medical records.  No interpreter  used.    HPI     Past Medical History:  Diagnosis Date  . Arthritis   . CAD (coronary artery disease)  cardiac cath with moderate disease in a septal branch of the ramus intermedius 04/01/2012  . Depression   . Diabetes mellitus    poorly controlled by his report  . History of narcotic addiction ([place])    past history of back pain  . Hypercholesteremia   . Hypertension   . IBS (irritable bowel syndrome)   . 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[place])   . Neuropathy   . Obesity    Max weight was 390  . OSA on CPAP   . Panic attacks   . Testosterone deficiency   . Vertigo     Patient Active Problem List   Diagnosis Date Noted  . Acute left-sided low back pain with right-sided sciatica 07/19/2020  . Hip pain, acute, left 07/05/2020  . Tremor 07/05/2020  . Weakness 07/05/2020  . Balance problem 03/11/2020  . Tinea cruris 03/11/2020  . Cellulitis of left groin 03/11/2020  . Acute pain of right shoulder 03/11/2020  . Interstitial lung disease ([place]) 05/02/2017  . Pansinusitis 09/26/2016  . Diabetic polyneuropathy associated with diabetes mellitus due to underlying condition ([place]) 05/08/2016  . Methamphetamine use disorder, severe, dependence ([place]) 02/11/2016  . Substance induced mood disorder ([place]) 02/11/2016  . Exertional dyspnea 11/30/2015  . ADD (attention deficit disorder) 09/29/2015  . Binge eating 09/29/2015  . Syncope 05/05/2012  . Diarrhea 05/05/2012  . Panic  attacks 05/05/2012  . OSA (obstructive sleep apnea) 05/05/2012  . HTN (hypertension) 05/05/2012  . Diabetes mellitus, type II ([place]) 05/05/2012  . Dyslipidemia 05/05/2012  . CAD (coronary artery disease)  cardiac cath with moderate disease in a septal branch of the ramus intermedius 04/01/2012  . Chest pain 04/01/2012  . Drug abuse and dependence ([place]) 04/01/2012  . Family history of coronary artery disease 03/31/2012  . Sleep apnea, on C-pap 03/31/2012  . Hyperlipemia 03/30/2012  . HTN (hypertension), benign  03/30/2012  . Morbid obesity ([place]) 03/30/2012  . DM type 2, uncontrolled, with neuropathy ([place]) 03/30/2012  . Depression with suicidal ideation 03/30/2012    Past Surgical History:  Procedure Laterality Date  . CARDIAC CATHETERIZATION    . LEFT HEART CATHETERIZATION WITH CORONARY ANGIOGRAM N/A 03/31/2012   Procedure: LEFT HEART CATHETERIZATION WITH CORONARY ANGIOGRAM;  Surgeon: Leonie Man, MD;  Location: [facility] CATH LAB;  Service: Cardiovascular;  Laterality: N/A;       Family History  Problem Relation Age of Onset  . Diabetes type II Father   . Hypertension Father   . Pancreatic disease Father 55       Deceased  . Healthy Mother   . Healthy Sister   . Healthy Son   . Healthy Daughter   . Parkinson's disease Maternal Grandmother     Social History   Tobacco Use  . Smoking status: Former Smoker    Packs/day: 0.50    Types: Cigarettes    Quit date: 09/29/2020    Years since quitting: 0.0  . Smokeless tobacco: Never Used  Vaping Use  . Vaping Use: Never used  Substance Use Topics  . Alcohol use: No    Alcohol/week: 0.0 standard drinks  . Drug use: Not Currently    Types: Methamphetamines    Home Medications Prior to Admission medications   Medication Sig Start Date End Date Taking? Authorizing Provider  HYDROcodone-acetaminophen (NORCO/VICODIN) 5-325 MG tablet Take 1 tablet by mouth every 4 (four) hours as needed. 11/01/20  Yes Britteney Ayotte A, PA-C  acetaminophen (TYLENOL) 325 MG tablet Take 650 mg by mouth every 6 (six) hours as needed for mild pain, fever or headache.    [provider]  diclofenac Sodium (VOLTAREN) 1 % GEL Apply 2 g topically 4 (four) times daily. 08/27/20   Corena Herter, PA-C  DULoxetine (CYMBALTA) 30 MG capsule Take 1 capsule (30 mg total) by mouth daily. 07/19/20   [name], DO  gabapentin (NEURONTIN) 300 MG capsule Take 2 tablets in the morning, 1 tablet in the afternoon, and 1 tablet at bedtime. 10/27/20   Patel,  Arvin Collard K, DO  insulin aspart (NOVOLOG) 100 UNIT/ML injection Inject 10 Units into the skin 3 (three) times daily with meals. 07/19/20   [name] R, DO  insulin degludec (TRESIBA FLEXTOUCH) 100 UNIT/ML FlexTouch Pen Inject 40 Units into the skin daily. 10/31/20   [name], DO  losartan (COZAAR) 100 MG tablet Take 1 tablet (100 mg total) by mouth daily. 07/05/20   [name], DO  meloxicam (MOBIC) 7.5 MG tablet Take 1 tablet (7.5 mg total) by mouth 2 (two) times daily as needed for pain. 07/28/20   Leandrew Koyanagi, MD  methocarbamol (ROBAXIN) 500 MG tablet Take 1 tablet (500 mg total) by mouth 4 (four) times daily. 08/26/20   [name] R, DO  naproxen sodium (ALEVE) 220 MG tablet Take 440-660 mg by mouth 2 (two) times  daily as needed (pain/headache).    [provider]  pravastatin (PRAVACHOL) 40 MG tablet Take 1 tablet (40 mg total) by mouth daily. 03/10/20   [name], DO  tamsulosin (FLOMAX) 0.4 MG CAPS capsule Take 1 capsule (0.4 mg total) by mouth daily. 03/10/20   [name], DO  traMADol (ULTRAM) 50 MG tablet Take 1-2 tablets by mouth every 8 hours as needed for pain. 10/28/20   [name], DO   Allergies    Patient has no known allergies.  Review of Systems   Review of Systems  Constitutional: Negative.   HENT: Negative.   Eyes: Negative.   Respiratory: Negative.   Cardiovascular: Negative.   Gastrointestinal: Negative.   Genitourinary: Negative.   Musculoskeletal: Positive for back pain. Negative for arthralgias, gait problem, joint swelling, myalgias, neck pain and neck stiffness.       Bil hip pain, right knee pain  Skin: Negative.   Neurological: Negative.   All other systems reviewed and are negative.  Physical Exam Updated Vital Signs BP (!) 175/113 (BP Location: Right Arm)   Pulse 89   Temp 97.6 F (36.4 C) (Oral)   Resp 20   SpO2 99%   Physical Exam Vitals and nursing note reviewed.   Constitutional:      General: He is not in acute distress.    Appearance: He is well-developed. He is not toxic-appearing or diaphoretic.     Comments: Chronically ill-appearing  HENT:     Head: Normocephalic and atraumatic.     Jaw: There is normal jaw occlusion.     Nose: Nose normal.     Mouth/Throat:     Lips: Pink.     Mouth: Mucous membranes are moist.     Pharynx: Oropharynx is clear. Uvula midline.     Comments: Posterior oropharynx clear.  Tongue midline.  No pooling of secretions Eyes:     Pupils: Pupils are equal, round, and reactive to light.  Neck:     Trachea: Trachea and phonation normal.     Comments: No cervical motion tenderness, full range of motion Cardiovascular:     Rate and Rhythm: Normal rate and regular rhythm.     Pulses:          Radial pulses are 2+ on the right side and 2+ on the left side.       Dorsalis pedis pulses are 1+ on the right side and 1+ on the left side.     Heart sounds: Normal heart sounds.  Pulmonary:     Effort: Pulmonary effort is normal. No respiratory distress.     Breath sounds: Normal breath sounds.  Chest:     Comments: Equal rise and fall to chest wall.  Nontender Abdominal:     General: Bowel sounds are normal. There is no distension.     Palpations: Abdomen is soft.     Comments: Soft, nontender without rebound or guarding  Musculoskeletal:        General: Normal range of motion.     Cervical back: Full passive range of motion without pain, normal range of motion and neck supple.     Comments: Mild tenderness to right knee.  Able to straight leg raise bilaterally.  Negative varus, valgus stress, negative anterior drawer.  Pelvis stable, nontender palpation.  No shortening or rotation of legs.  Diffuse tenderness to lumbar spine.  Feet:     Right foot:     Toenail Condition:  Right toenails are abnormally thick and long.     Left foot:     Toenail Condition: Left toenails are abnormally thick and long.     Comments: 1+  pitting edema to left lower extremity knee distally. Skin:    General: Skin is warm and dry.     Capillary Refill: Capillary refill takes less than 2 seconds.     Comments: Edema to left lower extremity, no erythema, warmth.  Neurological:     General: No focal deficit present.     Mental Status: He is alert.     Cranial Nerves: Cranial nerves are intact.     Sensory: Sensation is intact.     Motor: Motor function is intact.     Gait: Gait is intact.     Comments: Cranial nerves II through grossly intact Ambulatory with walker which uses at baseline    ED Results / Procedures / Treatments   Labs (all labs ordered are listed, but only abnormal results are displayed) Labs Reviewed - No data to display  EKG None  Radiology DG Chest 2 View  Result Date: 11/01/2020 CLINICAL DATA:  Lower extremity edema. Fall this morning getting out of the tub. Shortness of breath. EXAM: CHEST - 2 VIEW COMPARISON:  Radiograph yesterday. FINDINGS: The cardiomediastinal contours are normal. The lungs are clear. Pulmonary vasculature is normal. No consolidation, pleural effusion, or pneumothorax. No acute osseous abnormalities are seen. Mild thoracic spondylosis. IMPRESSION: No acute chest findings. Electronically Signed   By: Keith Rake M.D.   On: 11/01/2020 17:40   DG Lumbar Spine Complete  Result Date: 11/01/2020 CLINICAL DATA:  Fall this morning getting out of the tub. EXAM: LUMBAR SPINE - COMPLETE 4+ VIEW COMPARISON:  Lumbar MRI 08/17/2020. radiograph 07/05/2020 FINDINGS: The alignment is maintained. Vertebral body heights are normal. No fracture. Endplate spurring at O9-B3, with additional spurring throughout. Mild L1-L2 and L2-L3 disc space narrowing. Lower lumbar facet hypertrophy. Sacroiliac joints are congruent. IMPRESSION: 1. No acute fracture or subluxation of the lumbar spine. 2. Stable degenerative change. Electronically Signed   By: Keith Rake M.D.   On: 11/01/2020 17:37   CT Head  Wo Contrast  Result Date: 11/01/2020 CLINICAL DATA:  Neck trauma. [place] getting out of bathtub hitting head. EXAM: CT HEAD WITHOUT CONTRAST CT CERVICAL SPINE WITHOUT CONTRAST TECHNIQUE: Multidetector CT imaging of the head and cervical spine was performed following the standard protocol without intravenous contrast. Multiplanar CT image reconstructions of the cervical spine were also generated. COMPARISON:  11/04/2018 FINDINGS: CT HEAD FINDINGS Brain: No evidence of acute infarction, hemorrhage, hydrocephalus, extra-axial collection or mass lesion/mass effect. There is mild diffuse low-attenuation within the subcortical and periventricular white matter compatible with chronic microvascular disease. Remote lacunar infarct within the right frontal lobe periventricular white matter, image 21/2. This is new when compared with 11/04/2019 Vascular: No hyperdense vessel or unexpected calcification. Skull: Normal. Negative for fracture or focal lesion. Sinuses/Orbits: No acute finding. Other: None CT CERVICAL SPINE FINDINGS Alignment: Normal alignment. Skull base and vertebrae: The inferior margin of the C7 vertebra is excluded from field of view. Imaged portions of the cervical vertebra are intact without signs of acute fracture or dislocation. Soft tissues and spinal canal: No prevertebral fluid or swelling. No visible canal hematoma. Disc levels: Mild disc space narrowing and endplate spurring noted at C4-5 and C6-7 Upper chest: Negative. Other: None IMPRESSION: 1. No acute intracranial abnormalities. 2. Chronic small vessel ischemic change and brain atrophy. 3. No evidence for cervical  spine fracture. 4. Mild cervical degenerative disc disease. Electronically Signed   By: Kerby Moors M.D.   On: 11/01/2020 16:55   CT Cervical Spine Wo Contrast  Result Date: 11/01/2020 CLINICAL DATA:  Neck trauma. [place] getting out of bathtub hitting head. EXAM: CT HEAD WITHOUT CONTRAST CT CERVICAL SPINE WITHOUT CONTRAST TECHNIQUE:  Multidetector CT imaging of the head and cervical spine was performed following the standard protocol without intravenous contrast. Multiplanar CT image reconstructions of the cervical spine were also generated. COMPARISON:  11/04/2018 FINDINGS: CT HEAD FINDINGS Brain: No evidence of acute infarction, hemorrhage, hydrocephalus, extra-axial collection or mass lesion/mass effect. There is mild diffuse low-attenuation within the subcortical and periventricular white matter compatible with chronic microvascular disease. Remote lacunar infarct within the right frontal lobe periventricular white matter, image 21/2. This is new when compared with 11/04/2019 Vascular: No hyperdense vessel or unexpected calcification. Skull: Normal. Negative for fracture or focal lesion. Sinuses/Orbits: No acute finding. Other: None CT CERVICAL SPINE FINDINGS Alignment: Normal alignment. Skull base and vertebrae: The inferior margin of the C7 vertebra is excluded from field of view. Imaged portions of the cervical vertebra are intact without signs of acute fracture or dislocation. Soft tissues and spinal canal: No prevertebral fluid or swelling. No visible canal hematoma. Disc levels: Mild disc space narrowing and endplate spurring noted at C4-5 and C6-7 Upper chest: Negative. Other: None IMPRESSION: 1. No acute intracranial abnormalities. 2. Chronic small vessel ischemic change and brain atrophy. 3. No evidence for cervical spine fracture. 4. Mild cervical degenerative disc disease. Electronically Signed   By: Kerby Moors M.D.   On: 11/01/2020 16:55   US Venous Img Lower Unilateral Left  Result Date: 11/01/2020 CLINICAL DATA:  Lower extremity edema, LEFT leg pain, fell this morning EXAM: LEFT LOWER EXTREMITY VENOUS DOPPLER ULTRASOUND TECHNIQUE: Gray-scale sonography with compression, as well as color and duplex ultrasound, were performed to evaluate the deep venous system(s) from the level of the common femoral vein through the  popliteal and proximal calf veins. COMPARISON:  06/20/2020 FINDINGS: VENOUS Normal compressibility of the common femoral, superficial femoral, and popliteal veins, as well as the visualized calf veins. Visualized portions of profunda femoral vein and great saphenous vein unremarkable. No filling defects to suggest DVT on grayscale or color Doppler imaging. Doppler waveforms show normal direction of venous flow, normal respiratory plasticity and response to augmentation. Limited views of the contralateral common femoral vein are unremarkable. OTHER None. Limitations: none IMPRESSION: No evidence of deep venous thrombosis in the LEFT lower extremity. Electronically Signed   By: Lavonia Dana M.D.   On: 11/01/2020 16:26   DG Chest Portable 1 View  Result Date: 10/31/2020 CLINICAL DATA:  Syncope. EXAM: PORTABLE CHEST 1 VIEW COMPARISON:  July 08, 2020. FINDINGS: The heart size and mediastinal contours are within normal limits. Both lungs are clear. No visible pleural effusions or pneumothorax. No acute osseous abnormality. IMPRESSION: Low lung volumes without evidence of acute cardiopulmonary disease. Electronically Signed   By: Margaretha Sheffield MD   On: 10/31/2020 18:33   DG Knee Complete 4 Views Right  Result Date: 11/01/2020 CLINICAL DATA:  Fall this morning getting out of the tub. Right knee pain. EXAM: RIGHT KNEE - COMPLETE 4+ VIEW COMPARISON:  None. FINDINGS: No evidence of fracture, dislocation, or joint effusion. Normal alignment and joint spaces. Soft tissues are unremarkable. IMPRESSION: Negative radiographs of the right knee. Electronically Signed   By: Keith Rake M.D.   On: 11/01/2020 17:35   DG HIPS  BILAT WITH PELVIS MIN 5 VIEWS  Result Date: 11/01/2020 CLINICAL DATA:  Fall this morning getting out of the tub. Bilateral hip pain. EXAM: DG HIP (WITH OR WITHOUT PELVIS) 5+V BILAT COMPARISON:  Left hip radiograph 08/01/2020, hip MRI 08/17/2020 FINDINGS: The cortical margins of the bony  pelvis are intact. No fracture. Pubic symphysis and sacroiliac joints are congruent. Both femoral heads are well-seated in the respective acetabula. Mild bilateral acetabular spurring with preservation of joint spaces. IMPRESSION: No fracture of the pelvis or hips. Electronically Signed   By: Keith Rake M.D.   On: 11/01/2020 17:38    Procedures Procedures   Medications Ordered in ED Medications  morphine 4 MG/ML injection 4 mg (4 mg Intramuscular Given 11/01/20 1553)   ED Course  I have reviewed the triage vital signs and the nursing notes.  Pertinent labs & imaging results that were available during my care of the patient were reviewed by me and considered in my medical decision making (see chart for details).  Patient here for evaluation after mechanical fall which occurred this morning.  Of note he was seen here yesterday for dizziness and possible syncopal event.  He is afebrile, nonseptic, non-ill-appearing.  Patient is adamant that this was a mechanical fall and not a syncopal event.  Denies LOC, anticoagulation.  Has some diffuse lower back pain, no bowel or bladder incontinence, saddle paresthesia.  Ambulatory at his baseline.  Diffuse tenderness to right knee however neuromusculoskeletal exam bilaterally.  No shortening or rotation of legs.  Does have pitting edema to left lower extremity which patient states has been there for greater than a month.  He has no chest pain, shortness of breath.  No history of PE, DVT, recent surgery, immobilization or malignancy.  No overlying skin changes to suggest cellulitis, compartment syndrome, rhabdomyolysis, myositis.  Unfortunately he does have chronic weakness to his lower extremities currently being worked up by neurology for this.  Patient states the symptoms have not changed.  Imaging personally reviewed and interpreted: Ultrasound negative for DVT CT head, cervical without significant findings X-ray negative for acute fracture  Patient  ambulatory at baseline.  Pain controlled.  Low suspicion for acute neurosurgical emergency at this time.  He does have a short course of Tramadol at home however he states this did not help with his pain.  Will write for short course of Norco.  Discussed not taking tramadol he is taking this.  Discussed other anti-inflammatories.  Discussed follow-up with neurology for his baseline weakness.  He is agreeable for this.  Follow-up with PCP for reevaluation.  The patient has been appropriately medically screened and/or stabilized in the ED. I have low suspicion for any other emergent medical condition which would require further screening, evaluation or treatment in the ED or require inpatient management.  Patient is hemodynamically stable and in no acute distress.  Patient able to ambulate in department prior to ED.  Evaluation does not show acute pathology that would require ongoing or additional emergent interventions while in the emergency department or further inpatient treatment.  I have discussed the diagnosis with the patient and answered all questions.  Pain is been managed while in the emergency department and patient has no further complaints prior to discharge.  Patient is comfortable with plan discussed in room and is stable for discharge at this time.  I have discussed strict return precautions for returning to the emergency department.  Patient was encouraged to follow-up with PCP/specialist refer to at discharge.  MDM Rules/Calculators/A&P                           Final Clinical Impression(s) / ED Diagnoses Final diagnoses:  Fall, initial encounter    Rx / DC Orders ED Discharge Orders         Ordered    HYDROcodone-acetaminophen (NORCO/VICODIN) 5-325 MG tablet  Every 4 hours PRN        11/01/20 1825           Der Gagliano A, PA-C 11/01/20 [place], [place], DO 11/01/20 2313

## 2020-11-01 NOTE — ED Notes (Signed)
Pt assisted up to BR with tech , pt requested and was given cab  Ticket,  To go home

## 2020-11-01 NOTE — ED Notes (Signed)
Pt states that he someone  To come and help him  With clean and cook and to help him  So he dos'nt fall in shower ,

## 2020-11-01 NOTE — ED Notes (Signed)
Ask pt how long his left leg had been swollen , pt states for a while

## 2020-11-01 NOTE — Discharge Instructions (Signed)
Follow-up with primary care provider neurology.  Return for new worsening symptoms.

## 2020-11-01 NOTE — ED Triage Notes (Signed)
Pt arrives via EMS after a fall this morning getting out of the tub. States he fell backwards and hit his head, denies LOC, no thinners. Complains of right & left hip pain and right knee pain. EMS states pt was able to walk down stairs pta. Hx of back pain.

## 2020-11-02 ENCOUNTER — Inpatient Hospital Stay: Admission: RE | Admit: 2020-11-02 | Payer: Medicare HMO | Source: Ambulatory Visit

## 2020-11-02 NOTE — Telephone Encounter (Signed)
Please advise 

## 2020-11-03 NOTE — Telephone Encounter (Signed)
He has to be seen in order for [place] to order home health

## 2020-11-04 ENCOUNTER — Telehealth (HOSPITAL_COMMUNITY): Payer: Self-pay | Admitting: Emergency Medicine

## 2020-11-04 ENCOUNTER — Inpatient Hospital Stay (HOSPITAL_COMMUNITY)
Admission: EM | Admit: 2020-11-04 | Discharge: 2020-11-21 | DRG: 074 | Disposition: A | Discharge status: Home Health | Payer: Medicare HMO | Attending: Internal Medicine | Admitting: Internal Medicine

## 2020-11-04 ENCOUNTER — Other Ambulatory Visit: Payer: Self-pay

## 2020-11-04 ENCOUNTER — Encounter (HOSPITAL_COMMUNITY): Payer: Self-pay | Admitting: *Deleted

## 2020-11-04 DIAGNOSIS — E1165 Type 2 diabetes mellitus with hyperglycemia: Secondary | ICD-10-CM | POA: Diagnosis present

## 2020-11-04 DIAGNOSIS — F32A Depression, unspecified: Secondary | ICD-10-CM | POA: Diagnosis present

## 2020-11-04 DIAGNOSIS — R29898 Other symptoms and signs involving the musculoskeletal system: Secondary | ICD-10-CM | POA: Diagnosis not present

## 2020-11-04 DIAGNOSIS — E7800 Pure hypercholesterolemia, unspecified: Secondary | ICD-10-CM | POA: Diagnosis present

## 2020-11-04 DIAGNOSIS — L03113 Cellulitis of right upper limb: Secondary | ICD-10-CM

## 2020-11-04 DIAGNOSIS — R531 Weakness: Secondary | ICD-10-CM

## 2020-11-04 DIAGNOSIS — R296 Repeated falls: Secondary | ICD-10-CM | POA: Diagnosis not present

## 2020-11-04 DIAGNOSIS — IMO0002 Reserved for concepts with insufficient information to code with codable children: Secondary | ICD-10-CM | POA: Diagnosis present

## 2020-11-04 DIAGNOSIS — E669 Obesity, unspecified: Secondary | ICD-10-CM | POA: Diagnosis present

## 2020-11-04 DIAGNOSIS — Z79899 Other long term (current) drug therapy: Secondary | ICD-10-CM

## 2020-11-04 DIAGNOSIS — Z791 Long term (current) use of non-steroidal anti-inflammatories (NSAID): Secondary | ICD-10-CM

## 2020-11-04 DIAGNOSIS — I808 Phlebitis and thrombophlebitis of other sites: Secondary | ICD-10-CM | POA: Diagnosis not present

## 2020-11-04 DIAGNOSIS — Z833 Family history of diabetes mellitus: Secondary | ICD-10-CM

## 2020-11-04 DIAGNOSIS — M545 Low back pain, unspecified: Secondary | ICD-10-CM | POA: Diagnosis not present

## 2020-11-04 DIAGNOSIS — I2510 Atherosclerotic heart disease of native coronary artery without angina pectoris: Secondary | ICD-10-CM | POA: Diagnosis present

## 2020-11-04 DIAGNOSIS — N400 Enlarged prostate without lower urinary tract symptoms: Secondary | ICD-10-CM | POA: Diagnosis present

## 2020-11-04 DIAGNOSIS — E222 Syndrome of inappropriate secretion of antidiuretic hormone: Secondary | ICD-10-CM | POA: Diagnosis present

## 2020-11-04 DIAGNOSIS — G6181 Chronic inflammatory demyelinating polyneuritis: Secondary | ICD-10-CM | POA: Diagnosis not present

## 2020-11-04 DIAGNOSIS — Z9119 Patient's noncompliance with other medical treatment and regimen: Secondary | ICD-10-CM

## 2020-11-04 DIAGNOSIS — M549 Dorsalgia, unspecified: Secondary | ICD-10-CM | POA: Diagnosis not present

## 2020-11-04 DIAGNOSIS — E119 Type 2 diabetes mellitus without complications: Secondary | ICD-10-CM

## 2020-11-04 DIAGNOSIS — R42 Dizziness and giddiness: Secondary | ICD-10-CM | POA: Diagnosis not present

## 2020-11-04 DIAGNOSIS — E1144 Type 2 diabetes mellitus with diabetic amyotrophy: Secondary | ICD-10-CM | POA: Diagnosis present

## 2020-11-04 DIAGNOSIS — I10 Essential (primary) hypertension: Secondary | ICD-10-CM | POA: Diagnosis present

## 2020-11-04 DIAGNOSIS — G4733 Obstructive sleep apnea (adult) (pediatric): Secondary | ICD-10-CM | POA: Diagnosis present

## 2020-11-04 DIAGNOSIS — Z794 Long term (current) use of insulin: Secondary | ICD-10-CM

## 2020-11-04 DIAGNOSIS — E1142 Type 2 diabetes mellitus with diabetic polyneuropathy: Secondary | ICD-10-CM | POA: Diagnosis present

## 2020-11-04 DIAGNOSIS — M21372 Foot drop, left foot: Secondary | ICD-10-CM | POA: Diagnosis present

## 2020-11-04 DIAGNOSIS — Z8249 Family history of ischemic heart disease and other diseases of the circulatory system: Secondary | ICD-10-CM

## 2020-11-04 DIAGNOSIS — Z20822 Contact with and (suspected) exposure to covid-19: Secondary | ICD-10-CM | POA: Diagnosis present

## 2020-11-04 DIAGNOSIS — H5789 Other specified disorders of eye and adnexa: Secondary | ICD-10-CM | POA: Diagnosis present

## 2020-11-04 DIAGNOSIS — Z87891 Personal history of nicotine dependence: Secondary | ICD-10-CM

## 2020-11-04 DIAGNOSIS — E1140 Type 2 diabetes mellitus with diabetic neuropathy, unspecified: Secondary | ICD-10-CM | POA: Diagnosis present

## 2020-11-04 DIAGNOSIS — Z6841 Body Mass Index (BMI) 40.0 and over, adult: Secondary | ICD-10-CM

## 2020-11-04 LAB — BASIC METABOLIC PANEL
Anion gap: 11 (ref 5–15)
BUN: 23 mg/dL — ABNORMAL HIGH (ref 6–20)
CO2: 20 mmol/L — ABNORMAL LOW (ref 22–32)
Calcium: 8.8 mg/dL — ABNORMAL LOW (ref 8.9–10.3)
Chloride: 99 mmol/L (ref 98–111)
Creatinine, Ser: 1.17 mg/dL (ref 0.61–1.24)
GFR, Estimated: >60 mL/min (ref >60)
Glucose, Bld: 439 mg/dL — ABNORMAL HIGH (ref 70–99)
Potassium: 3.9 mmol/L (ref 3.5–5.1)
Sodium: 130 mmol/L — ABNORMAL LOW (ref 135–145)

## 2020-11-04 LAB — CBC
HCT: 40.9 % (ref 39.0–52.0)
Hemoglobin: 14.4 g/dL (ref 13.0–17.0)
MCH: 30.5 pg (ref 26.0–34.0)
MCHC: 35.2 g/dL (ref 30.0–36.0)
MCV: 86.7 fL (ref 80.0–100.0)
Platelets: 294 10*3/uL (ref 150–400)
RBC: 4.72 MIL/uL (ref 4.22–5.81)
RDW: 12.2 % (ref 11.5–15.5)
WBC: 6.9 10*3/uL (ref 4.0–10.5)
nRBC: 0 % (ref 0.0–0.2)

## 2020-11-04 LAB — CBG MONITORING, ED: Glucose-Capillary: 219 mg/dL — ABNORMAL HIGH (ref 70–99)

## 2020-11-04 NOTE — ED Notes (Signed)
Pt has note from Dr. Narda Amber which tells him that due to his progressive weakness and frequent falls she is advising that she spoke with a MD at [facility] and advised them that he will come in and require admission due to progressive weakness and falls as well as a spinal tap and PT evaluation.  Pt reports lower back pain with weakness in bilateral legs L>R, pt denies any weakness in arms. This has been worsening for close to a year.

## 2020-11-04 NOTE — Telephone Encounter (Signed)
Dr. Posey Pronto called.  She is sending pt to the ED.  Would like him to get an LP for recurrent falls, lower extrem weakness and evaluated for possible admission/ placement.

## 2020-11-04 NOTE — ED Triage Notes (Signed)
Pt has had dizziness for 7 months.  He has had back pain for a year.  Today his physician advised him to come to ED in ambulance for spinal tap to further evaluate symptoms.

## 2020-11-04 NOTE — ED Notes (Signed)
Pt given crackers and cheese and water. Triage RN aware.

## 2020-11-05 ENCOUNTER — Observation Stay (HOSPITAL_COMMUNITY): Payer: Medicare HMO

## 2020-11-05 DIAGNOSIS — E1144 Type 2 diabetes mellitus with diabetic amyotrophy: Secondary | ICD-10-CM

## 2020-11-05 DIAGNOSIS — R531 Weakness: Secondary | ICD-10-CM

## 2020-11-05 DIAGNOSIS — Z794 Long term (current) use of insulin: Secondary | ICD-10-CM | POA: Diagnosis not present

## 2020-11-05 DIAGNOSIS — I10 Essential (primary) hypertension: Secondary | ICD-10-CM

## 2020-11-05 DIAGNOSIS — M62562 Muscle wasting and atrophy, not elsewhere classified, left lower leg: Secondary | ICD-10-CM | POA: Diagnosis not present

## 2020-11-05 DIAGNOSIS — M47812 Spondylosis without myelopathy or radiculopathy, cervical region: Secondary | ICD-10-CM | POA: Diagnosis not present

## 2020-11-05 DIAGNOSIS — E1165 Type 2 diabetes mellitus with hyperglycemia: Secondary | ICD-10-CM | POA: Diagnosis not present

## 2020-11-05 DIAGNOSIS — G6181 Chronic inflammatory demyelinating polyneuritis: Secondary | ICD-10-CM | POA: Diagnosis not present

## 2020-11-05 DIAGNOSIS — E1140 Type 2 diabetes mellitus with diabetic neuropathy, unspecified: Secondary | ICD-10-CM

## 2020-11-05 DIAGNOSIS — R69 Illness, unspecified: Secondary | ICD-10-CM | POA: Diagnosis not present

## 2020-11-05 DIAGNOSIS — R296 Repeated falls: Secondary | ICD-10-CM

## 2020-11-05 DIAGNOSIS — M545 Low back pain, unspecified: Secondary | ICD-10-CM | POA: Diagnosis not present

## 2020-11-05 DIAGNOSIS — M4802 Spinal stenosis, cervical region: Secondary | ICD-10-CM | POA: Diagnosis not present

## 2020-11-05 DIAGNOSIS — R29898 Other symptoms and signs involving the musculoskeletal system: Secondary | ICD-10-CM | POA: Diagnosis present

## 2020-11-05 LAB — URINALYSIS, ROUTINE W REFLEX MICROSCOPIC
Bilirubin Urine: NEGATIVE
Glucose, UA: >=500 mg/dL — AB
Hgb urine dipstick: NEGATIVE
Ketones, ur: NEGATIVE mg/dL
Leukocytes,Ua: NEGATIVE
Nitrite: NEGATIVE
Protein, ur: NEGATIVE mg/dL
Specific Gravity, Urine: 1.028 (ref 1.005–1.030)
pH: 5 (ref 5.0–8.0)

## 2020-11-05 LAB — HEMOGLOBIN A1C
Hgb A1c MFr Bld: 10.6 % — ABNORMAL HIGH (ref 4.8–5.6)
Mean Plasma Glucose: 257.52 mg/dL

## 2020-11-05 LAB — CBG MONITORING, ED
Glucose-Capillary: 250 mg/dL — ABNORMAL HIGH (ref 70–99)
Glucose-Capillary: 264 mg/dL — ABNORMAL HIGH (ref 70–99)
Glucose-Capillary: 193 mg/dL — ABNORMAL HIGH (ref 70–99)

## 2020-11-05 LAB — MAGNESIUM: Magnesium: 1.9 mg/dL (ref 1.7–2.4)

## 2020-11-05 LAB — SARS CORONAVIRUS 2 (TAT 6-24 HRS): SARS Coronavirus 2: NEGATIVE

## 2020-11-05 LAB — RAPID HIV SCREEN (HIV 1/2 AB+AG)
HIV 1/2 Antibodies: NONREACTIVE
HIV-1 P24 Antigen - HIV24: NONREACTIVE

## 2020-11-05 LAB — TSH: TSH: 2.948 u[IU]/mL (ref 0.350–4.500)

## 2020-11-05 LAB — VITAMIN B12: Vitamin B-12: 538 pg/mL (ref 180–914)

## 2020-11-05 IMAGING — MR MR LUMBAR SPINE WO/W CM
5 of 7 series · 26 of 48 positions shown · IV contrast (Gadavist)
Comparison: Thoracic spine MRI [DATE].  Lumbar MRI [DATE].

CLINICAL DATA: 59-year-old male with progressive pain. Recurrent
falls. Bilateral lower extremity weakness. Polysubstance abuse.
Diabetes.

EXAM:
MRI LUMBAR SPINE WITHOUT AND WITH CONTRAST
TECHNIQUE: Multiplanar and multiecho pulse sequences of the lumbar spine were
obtained without and with intravenous contrast.
CONTRAST:  10mL GADAVIST GADOBUTROL 1 MMOL/ML IV SOLN

[Series 10: T2 · sagittal · 4.0mm · 0.73mm/px · 5 of 17 slices shown (1 of 2)]
[im 1/17]
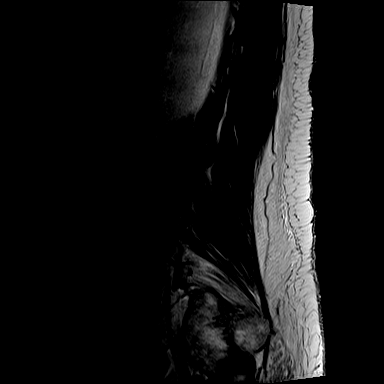
[im 5/17]
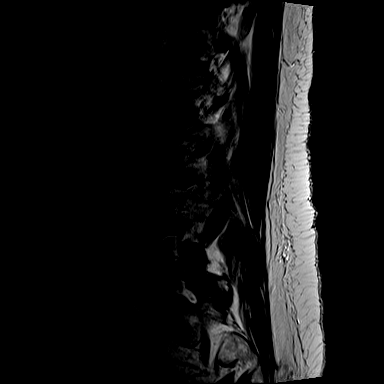
[im 9/17]
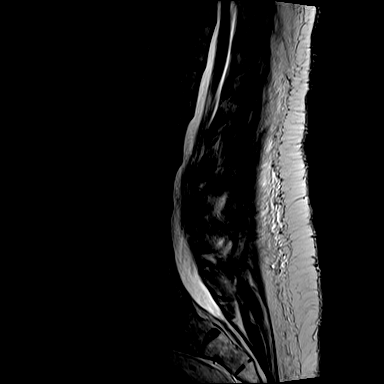
[im 13/17]
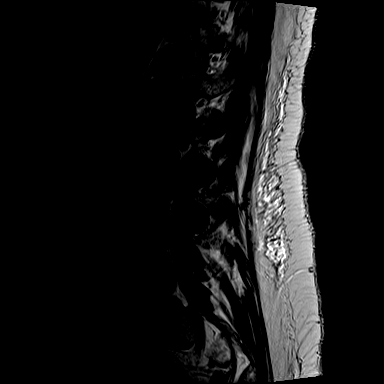
[im 17/17]
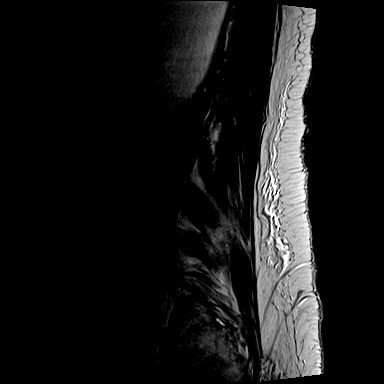

[Series 12: T1 · sagittal · 4.0mm · 0.88mm/px · 4 of 17 slices shown (1 of 2)]
[im 1/17]
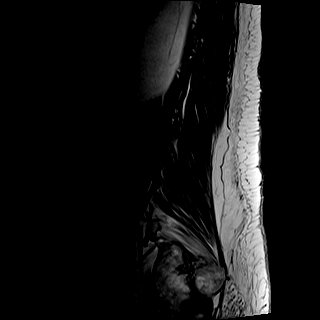
[im 6/17]
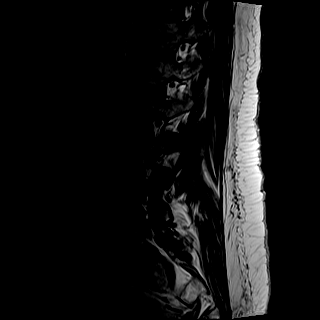
[im 11/17]
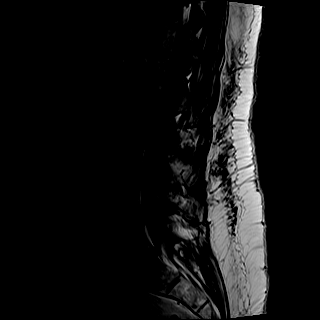
[im 17/17]
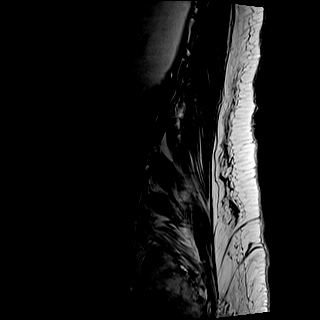

[Series 13: T2 · axial · 4.0mm · 0.57mm/px · z∈[-610,-378]mm · 8 of 42 slices shown (2 of 2)]
[im 1/42]
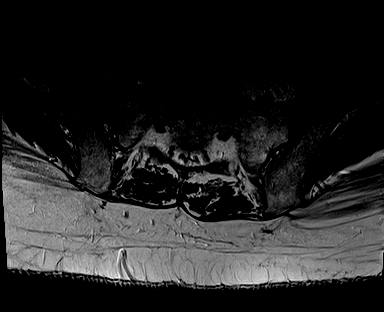
[im 5/42]
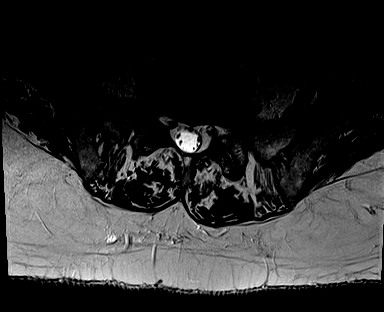
[im 14/42]
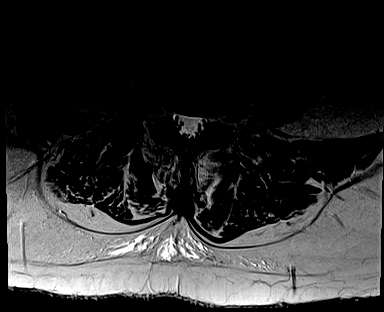
[im 19/42]
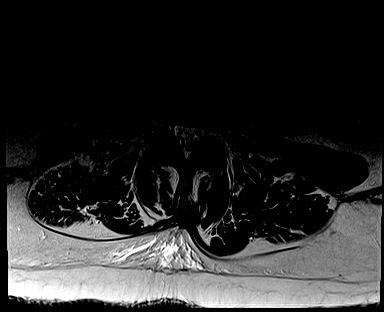
[im 23/42]
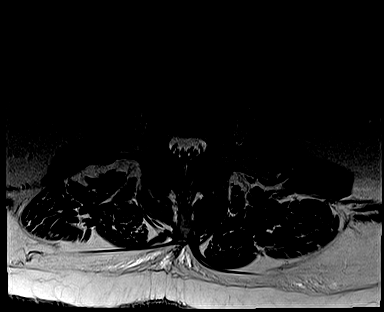
[im 28/42]
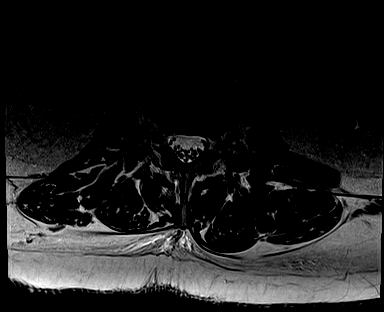
[im 37/42]
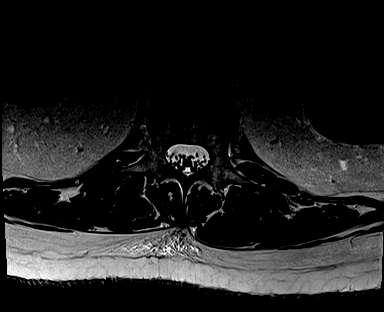
[im 42/42]
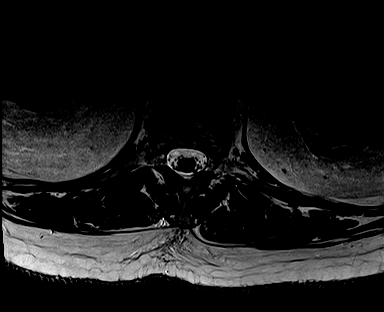

[Series 14: T1 · axial · 4.0mm · 0.34mm/px · z∈[-610,-378]mm · 8 of 42 slices shown (2 of 2)]
[im 1/42]
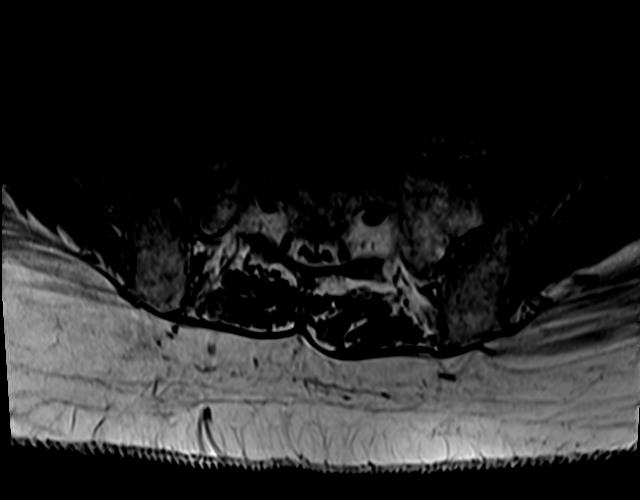
[im 5/42]
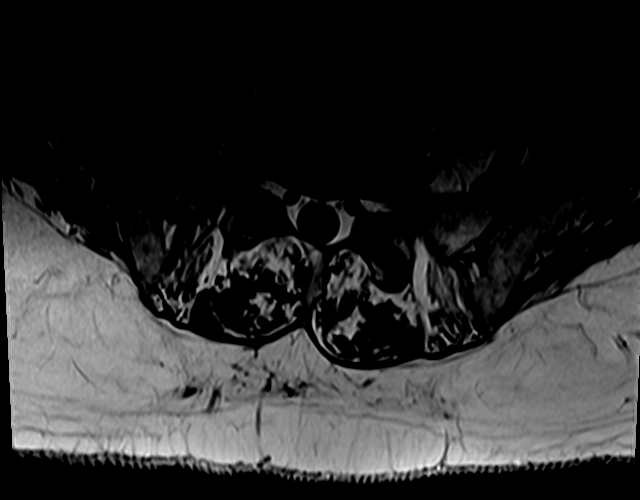
[im 14/42]
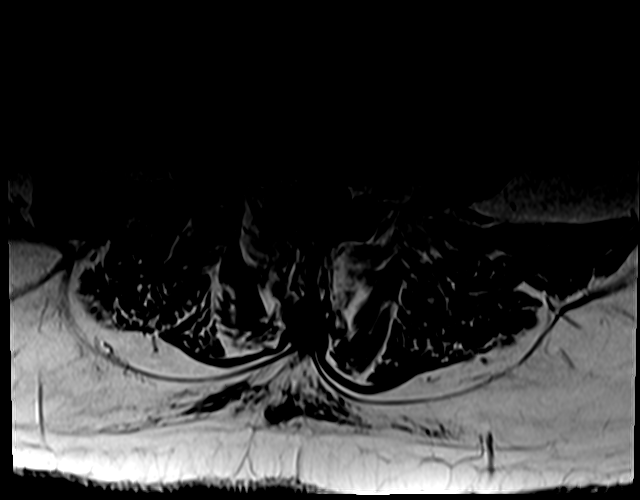
[im 19/42]
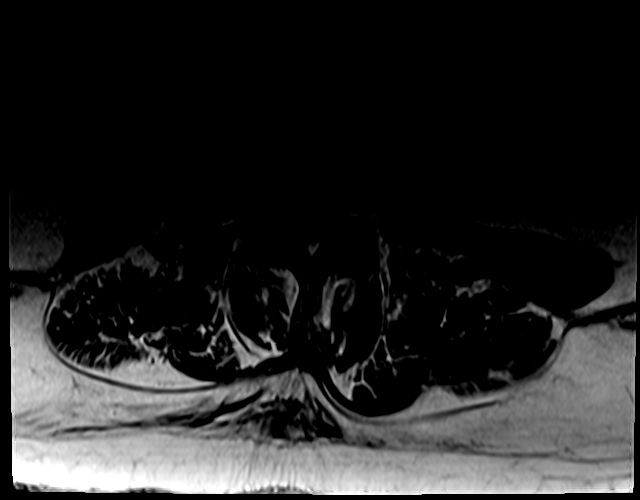
[im 23/42]
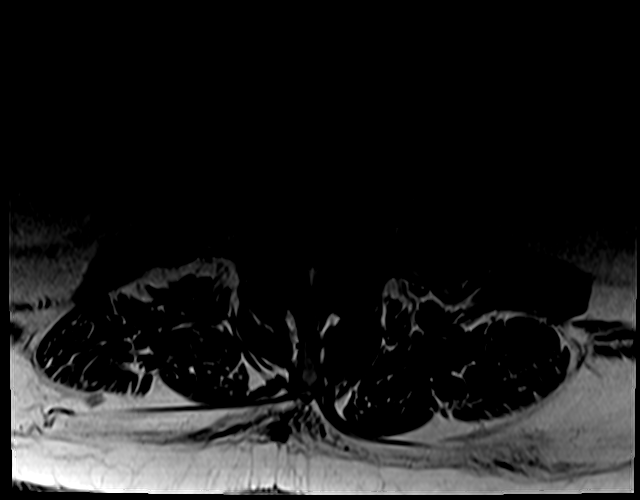
[im 28/42]
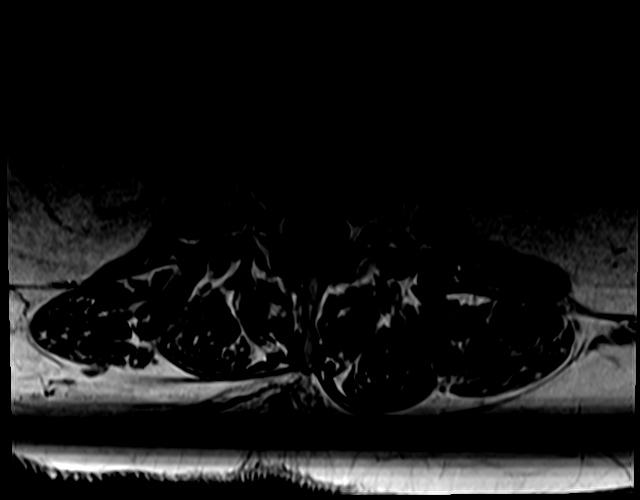
[im 37/42]
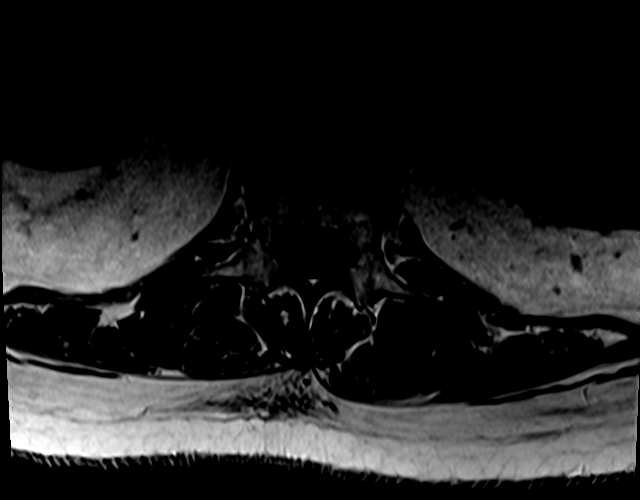
[im 42/42]
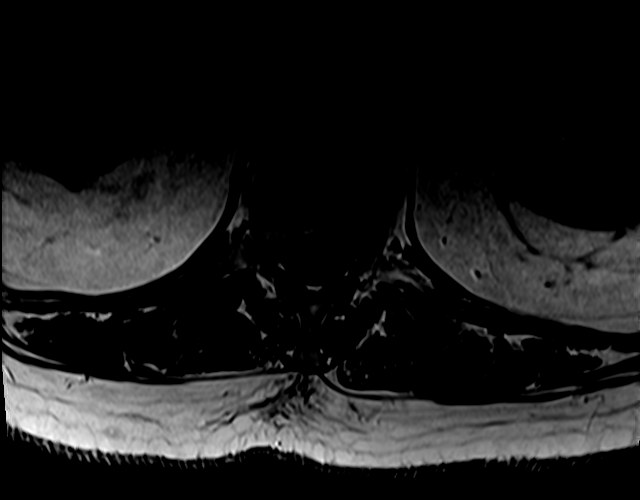

[Series 16: T1 fat-sat post-contrast · sagittal · 4.0mm · 0.88mm/px · 1 of 17 slices shown]
[im 1/17]
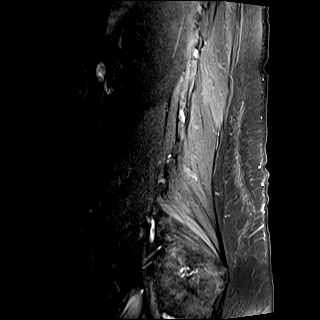

[26 of 48 positions shown; findings below may reference images not displayed]

FINDINGS: Segmentation: Lumbar segmentation appears to be normal and this
appears concordant with the thoracic spine numbering last month,
also the prior lumbar numbering.

Alignment: Stable lordosis since [REDACTED]. Subtle degenerative
retrolisthesis of L2 on L3.

Vertebrae: Stable since [REDACTED] round vertebral body hemangioma
located centrally and L3, benign. Normal background bone marrow
signal. No marrow edema or acute osseous abnormality. Intact visible
sacrum and SI joints.

Conus medullaris and cauda equina: Conus extends to the T12-L1
level. No lower spinal cord or conus signal abnormality. Cauda
equina nerve roots appear normal. No abnormal intradural enhancement
or dural thickening.

Paraspinal and other soft tissues: Stable and negative visible
abdominal viscera. There is subtle lumbar rect or spine I muscle
edema and enhancement on the right at L2-L3 (series 11, image 5).
There is more moderate sacral erector spinae muscle edema and
enhancement on the left (series 11, image 11. These are new since
[REDACTED]. No intramuscular fluid collection. Other paraspinal soft
tissues appear within normal limits.

Disc levels:

Negative visible lower thoracic levels.

L1-L2: Stable minor disc bulging and endplate spurring without
stenosis.

L2-L3: Mild circumferential disc bulge. Small right paracentral disc
protrusion and annular fissure appear mildly increased since
[REDACTED]. But there is no significant spinal stenosis, mild if any
associated right lateral recess stenosis (right L3 nerve level).

L3-L4:  Stable mild disc bulge without stenosis.

L4-L5:  Negative.

L5-S1: Moderate left facet hypertrophy and degenerative facet joint
fluid appear stable since [REDACTED]. No associated stenosis.
IMPRESSION: 1. Mild nonspecific inflammation in the right erector spinae muscle
at L2-L3, and on the left at S1-S2. These might be posttraumatic in
this setting.

2. No other acute or inflammatory process identified in the lumbar
spine.

3. Mild lumbar spine degeneration without spinal stenosis.
Rightward L2-L3 disc protrusion with annular fissure does appear
mildly increased since [REDACTED]. Query right L3 radiculitis.
Moderately advanced chronic facet degeneration on the left at L5-S1.

## 2020-11-05 IMAGING — MR MR CERVICAL SPINE W/O CM
5 series · 35 of 48 positions shown · non-contrast
Comparison: CT cervical spine [DATE]. thoracic spine MRI
[DATE].

CLINICAL DATA: 59-year-old male with progressive pain. Recurrent
falls. Bilateral lower extremity weakness. Polysubstance abuse.
Diabetes.

EXAM:
MRI CERVICAL SPINE WITHOUT CONTRAST
TECHNIQUE: Multiplanar, multisequence MR imaging of the cervical spine was
performed. No intravenous contrast was administered.

[Series 5: T2 · sagittal · 3.0mm · 0.69mm/px · 6 of 15 slices shown (1 of 2)]
[im 1/15]
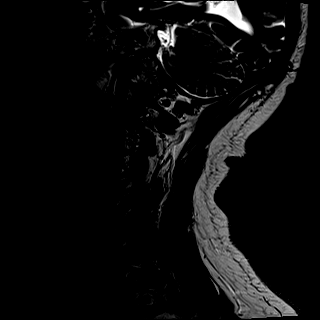
[im 3/15]
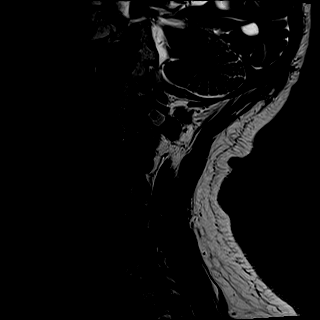
[im 6/15]
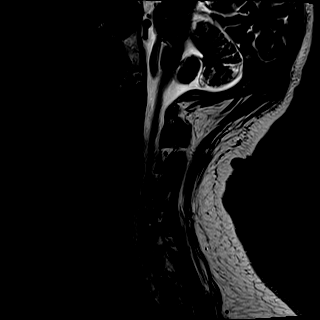
[im 9/15]
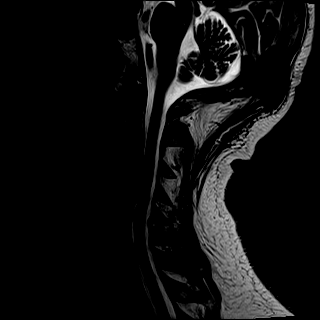
[im 12/15]
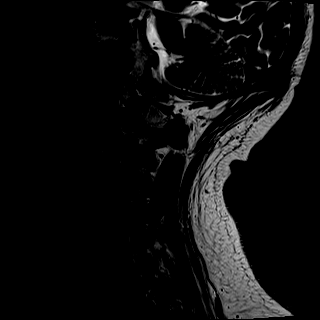
[im 15/15]
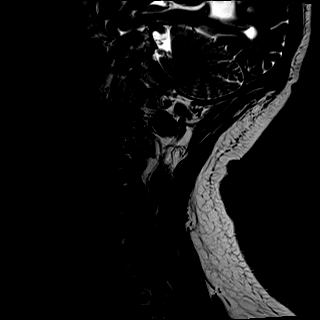

[Series 6: T1 · sagittal · 3.0mm · 0.69mm/px · 6 of 15 slices shown]
[im 1/15]
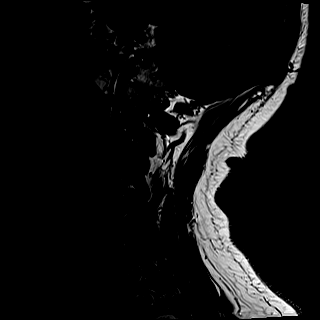
[im 3/15]
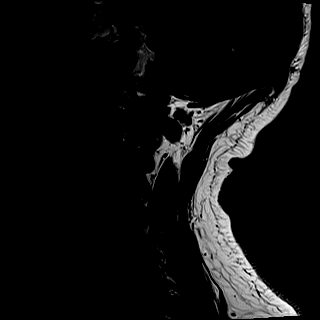
[im 6/15]
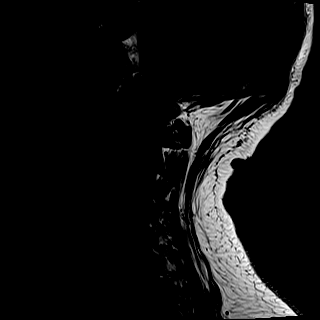
[im 9/15]
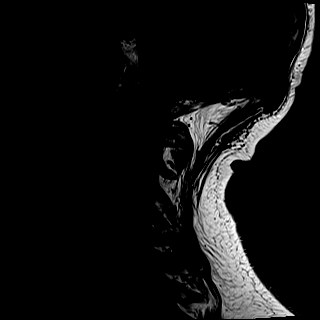
[im 12/15]
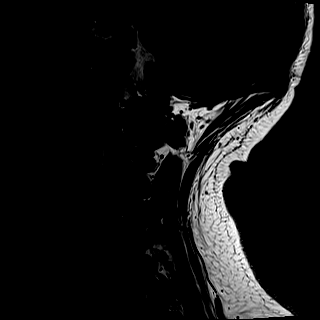
[im 15/15]
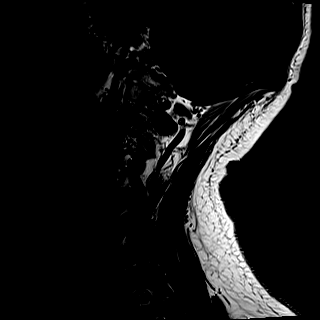

[Series 7: STIR · sagittal · 3.0mm · 0.86mm/px · 6 of 15 slices shown]
[im 1/15]
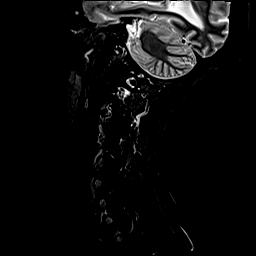
[im 3/15]
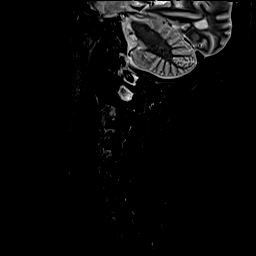
[im 6/15]
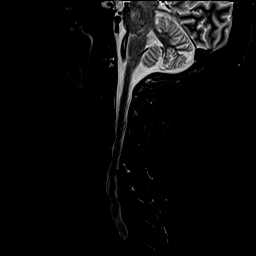
[im 9/15]
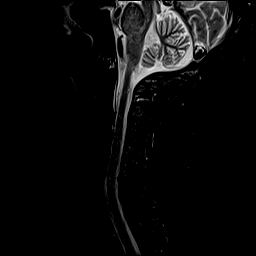
[im 12/15]
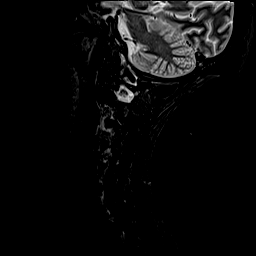
[im 15/15]
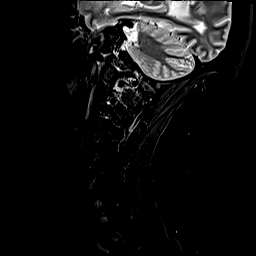

[Series 8: T2 · axial · 3.0mm · 0.66mm/px · z∈[-120,+7]mm · 9 of 40 slices shown (2 of 2)]
[im 1/40]
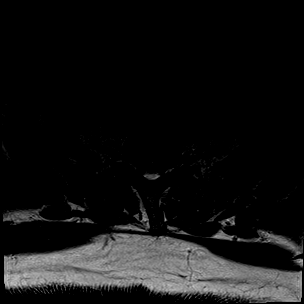
[im 6/40]
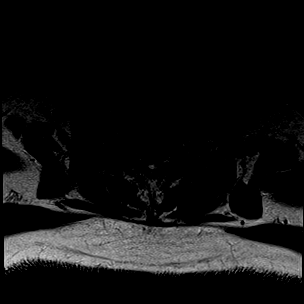
[im 12/40]
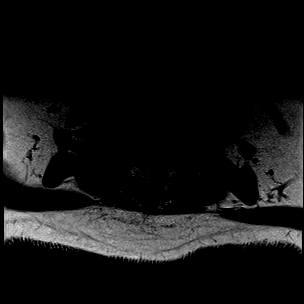
[im 17/40]
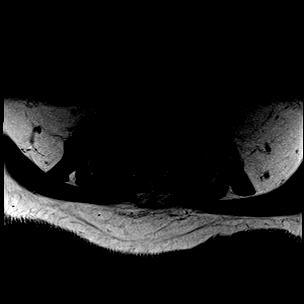
[im 20/40]
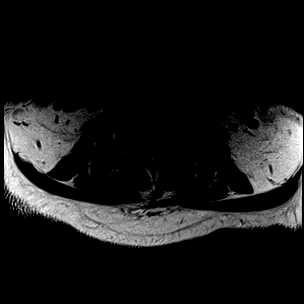
[im 23/40]
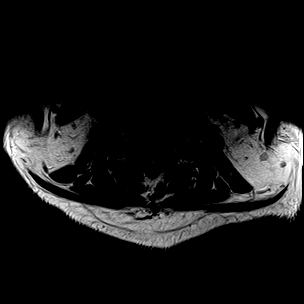
[im 28/40]
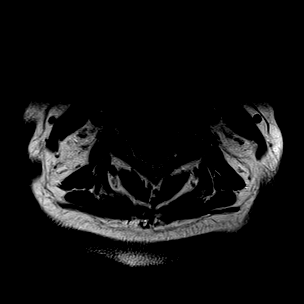
[im 34/40]
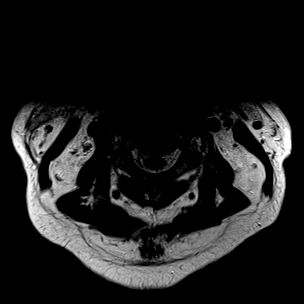
[im 40/40]
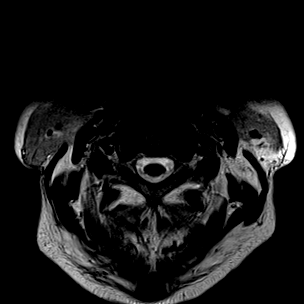

[Series 9: GRE · axial · 3.0mm · 0.39mm/px · z∈[-120,+7]mm · 8 of 40 slices shown]
[im 1/40]
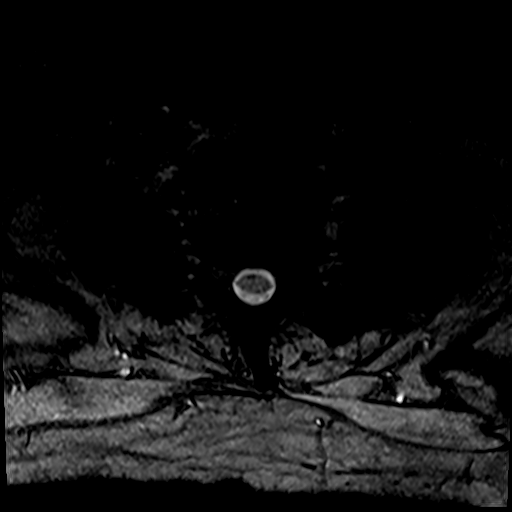
[im 6/40]
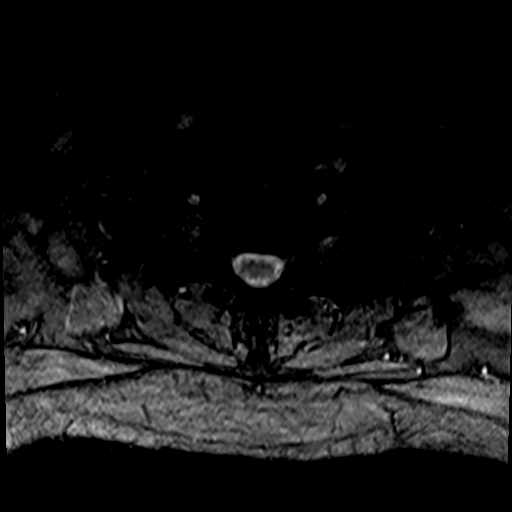
[im 12/40]
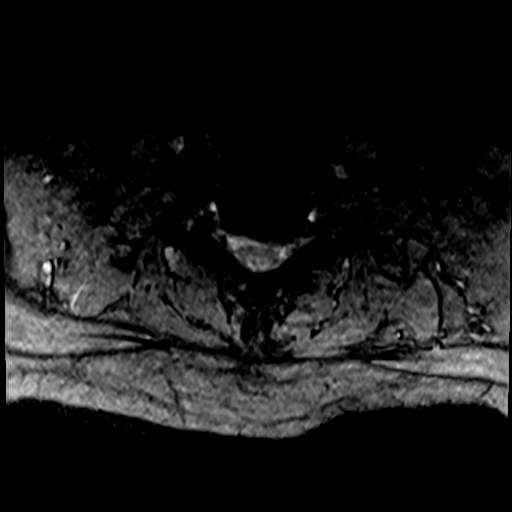
[im 17/40]
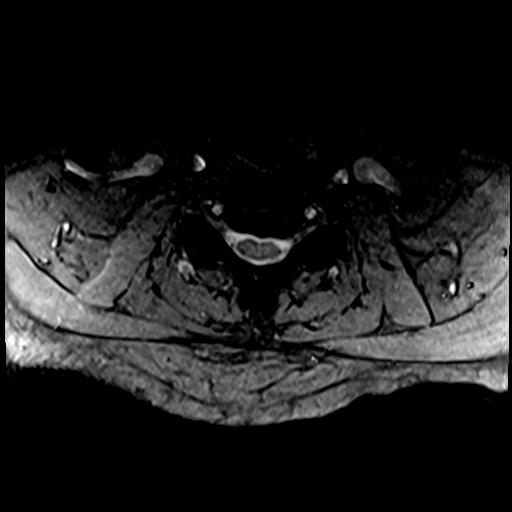
[im 23/40]
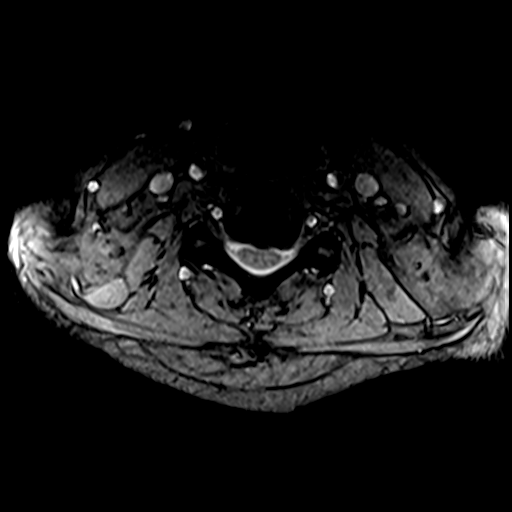
[im 28/40]
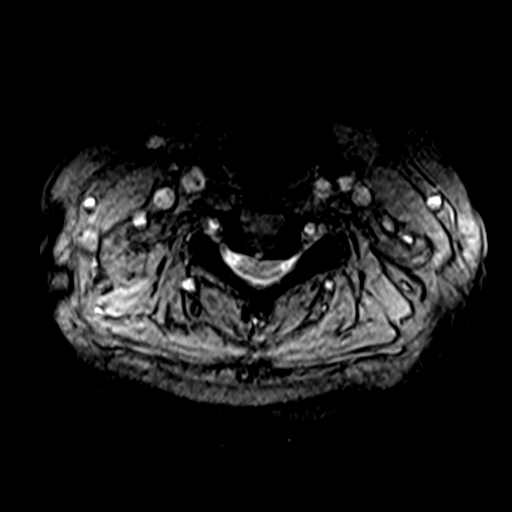
[im 34/40]
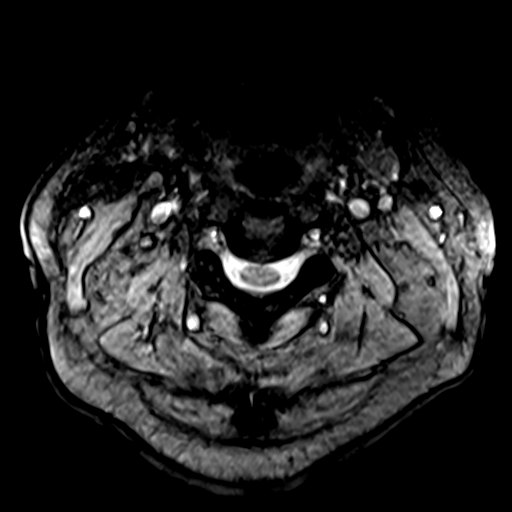
[im 40/40]
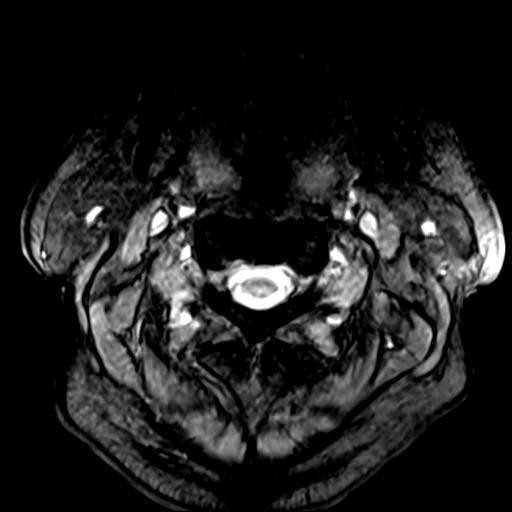

[35 of 48 positions shown; findings below may reference images not displayed]

FINDINGS: Alignment: Improved cervical lordosis from the recent CT. No
significant spondylolisthesis.

Vertebrae: No marrow edema or evidence of acute osseous abnormality.
Visualized bone marrow signal is within normal limits.

Cord: Cervical spinal cord appears to remain normal despite some
mild degenerative cervical spinal stenosis detailed below. Negative
visible upper thoracic cord.

Posterior Fossa, vertebral arteries, paraspinal tissues:
Cervicomedullary junction is within normal limits. There is patchy
T2 and STIR hyperintensity in the central pons which appears
nonspecific on series 7, image 8. Otherwise negative visible
posterior fossa.

Preserved major vascular flow voids in the neck. Vertebral arteries
appear codominant. Negative visible neck soft tissues.

Disc levels:

C2-C3: Minor disc bulging, endplate spurring and ligament flavum
hypertrophy without stenosis.

C3-C4: Small central disc protrusion with annular fissure (series 9,
image 14). Otherwise mild leftward disc bulging and endplate
spurring mostly affecting the foramen. Mild spinal stenosis. No
significant cord mass effect. Mild to moderate left C4 foraminal
stenosis.

C4-C5: Circumferential disc bulge with broad-based posterior
component effacing the ventral CSF space. Up to mild spinal
stenosis. No cord mass effect. No significant foraminal stenosis.

C5-C6:  Mild endplate spurring.  Mild left C6 foraminal stenosis.

C6-C7: Left eccentric disc bulging, broad-based posterior component.
Associated left foraminal disc and endplate spurring. No spinal
stenosis. Moderate left C7 foraminal stenosis.

C7-T1:  Negative.

T1-T2 level appears stable, negative for age.
IMPRESSION: 1. Small disc herniations at C3-C4 and C4-C5 result in up to mild
spinal stenosis with no significant cord mass effect. No abnormal
cord signal.
2. Up to moderate degenerative neural foraminal stenosis at the left
C4 and left C7 nerve levels. Mild for age cervical spine
degeneration otherwise.
3. Nonspecific T2 and STIR hyperintensity in the pons. Most commonly
this would be due to chronic small vessel disease.

## 2020-11-05 MED ORDER — LOSARTAN POTASSIUM 50 MG PO TABS
100.0000 mg | ORAL_TABLET | Freq: Every day | ORAL | Status: DC
  Administered 2020-11-05 – 2020-11-21 (×17): 100 mg via ORAL
  Filled 2020-11-05 (×17): qty 2

## 2020-11-05 MED ORDER — DICLOFENAC SODIUM 1 % EX GEL
2.0000 g | Freq: Four times a day (QID) | CUTANEOUS | Status: DC | PRN
  Administered 2020-11-05 – 2020-11-20 (×9): 2 g via TOPICAL
  Filled 2020-11-05 (×2): qty 100

## 2020-11-05 MED ORDER — DULOXETINE HCL 30 MG PO CPEP
30.0000 mg | ORAL_CAPSULE | ORAL | Status: DC
  Administered 2020-11-07 – 2020-11-21 (×7): 30 mg via ORAL
  Filled 2020-11-05 (×7): qty 1

## 2020-11-05 MED ORDER — ENOXAPARIN SODIUM 60 MG/0.6ML ~~LOC~~ SOLN
60.0000 mg | SUBCUTANEOUS | Status: DC
  Administered 2020-11-05 – 2020-11-07 (×3): 60 mg via SUBCUTANEOUS
  Filled 2020-11-05 (×3): qty 0.6

## 2020-11-05 MED ORDER — METHOCARBAMOL 500 MG PO TABS
500.0000 mg | ORAL_TABLET | Freq: Four times a day (QID) | ORAL | Status: DC | PRN
  Administered 2020-11-05 – 2020-11-21 (×26): 500 mg via ORAL
  Filled 2020-11-05 (×28): qty 1

## 2020-11-05 MED ORDER — GABAPENTIN 300 MG PO CAPS: 300.0000 mg | ORAL_CAPSULE | ORAL | Status: DC

## 2020-11-05 MED ORDER — INSULIN ASPART 100 UNIT/ML ~~LOC~~ SOLN: 10.0000 [IU] | Freq: Three times a day (TID) | SUBCUTANEOUS | Status: DC

## 2020-11-05 MED ORDER — MELOXICAM 7.5 MG PO TABS
7.5000 mg | ORAL_TABLET | Freq: Two times a day (BID) | ORAL | Status: DC | PRN
  Administered 2020-11-06 – 2020-11-20 (×4): 7.5 mg via ORAL
  Filled 2020-11-05 (×5): qty 1

## 2020-11-05 MED ORDER — INSULIN ASPART 100 UNIT/ML ~~LOC~~ SOLN
0.0000 [IU] | Freq: Three times a day (TID) | SUBCUTANEOUS | Status: DC
  Administered 2020-11-05: 3 [IU] via SUBCUTANEOUS
  Administered 2020-11-05: 5 [IU] via SUBCUTANEOUS
  Administered 2020-11-05: 8 [IU] via SUBCUTANEOUS
  Administered 2020-11-06: 3 [IU] via SUBCUTANEOUS
  Administered 2020-11-06: 8 [IU] via SUBCUTANEOUS
  Administered 2020-11-07: 3 [IU] via SUBCUTANEOUS
  Administered 2020-11-07 (×2): 5 [IU] via SUBCUTANEOUS
  Administered 2020-11-08: 3 [IU] via SUBCUTANEOUS
  Administered 2020-11-08: 2 [IU] via SUBCUTANEOUS
  Administered 2020-11-08 – 2020-11-09 (×2): 3 [IU] via SUBCUTANEOUS
  Administered 2020-11-09 (×2): 5 [IU] via SUBCUTANEOUS
  Administered 2020-11-10: 3 [IU] via SUBCUTANEOUS
  Administered 2020-11-10: 2 [IU] via SUBCUTANEOUS
  Administered 2020-11-10 – 2020-11-11 (×2): 5 [IU] via SUBCUTANEOUS
  Administered 2020-11-11 (×2): 3 [IU] via SUBCUTANEOUS
  Administered 2020-11-12 (×2): 5 [IU] via SUBCUTANEOUS
  Administered 2020-11-12 – 2020-11-13 (×2): 3 [IU] via SUBCUTANEOUS
  Administered 2020-11-13: 5 [IU] via SUBCUTANEOUS
  Administered 2020-11-13: 3 [IU] via SUBCUTANEOUS
  Administered 2020-11-14: 5 [IU] via SUBCUTANEOUS
  Administered 2020-11-14 – 2020-11-15 (×3): 3 [IU] via SUBCUTANEOUS
  Administered 2020-11-15 (×2): 5 [IU] via SUBCUTANEOUS
  Administered 2020-11-16: 2 [IU] via SUBCUTANEOUS
  Administered 2020-11-16: 5 [IU] via SUBCUTANEOUS
  Administered 2020-11-17 – 2020-11-18 (×3): 3 [IU] via SUBCUTANEOUS
  Administered 2020-11-18: 5 [IU] via SUBCUTANEOUS
  Administered 2020-11-19 – 2020-11-20 (×4): 3 [IU] via SUBCUTANEOUS
  Administered 2020-11-21: 2 [IU] via SUBCUTANEOUS

## 2020-11-05 MED ORDER — GABAPENTIN 300 MG PO CAPS
300.0000 mg | ORAL_CAPSULE | ORAL | Status: DC
  Administered 2020-11-05 – 2020-11-20 (×30): 300 mg via ORAL
  Filled 2020-11-05 (×30): qty 1

## 2020-11-05 MED ORDER — TAMSULOSIN HCL 0.4 MG PO CAPS
0.4000 mg | ORAL_CAPSULE | Freq: Every day | ORAL | Status: DC
  Administered 2020-11-05 – 2020-11-21 (×17): 0.4 mg via ORAL
  Filled 2020-11-05 (×17): qty 1

## 2020-11-05 MED ORDER — ACETAMINOPHEN 650 MG RE SUPP: 650.0000 mg | Freq: Four times a day (QID) | RECTAL | Status: DC | PRN

## 2020-11-05 MED ORDER — PRAVASTATIN SODIUM 40 MG PO TABS
40.0000 mg | ORAL_TABLET | Freq: Every day | ORAL | Status: DC
  Administered 2020-11-05 – 2020-11-20 (×16): 40 mg via ORAL
  Filled 2020-11-05 (×16): qty 1

## 2020-11-05 MED ORDER — GADOBUTROL 1 MMOL/ML IV SOLN
10.0000 mL | Freq: Once | INTRAVENOUS | Status: AC | PRN
  Administered 2020-11-05: 10 mL via INTRAVENOUS

## 2020-11-05 MED ORDER — INSULIN GLARGINE 100 UNIT/ML ~~LOC~~ SOLN
40.0000 [IU] | Freq: Every day | SUBCUTANEOUS | Status: DC
  Administered 2020-11-05 – 2020-11-06 (×2): 40 [IU] via SUBCUTANEOUS
  Filled 2020-11-05 (×2): qty 0.4

## 2020-11-05 MED ORDER — TRAMADOL HCL 50 MG PO TABS
50.0000 mg | ORAL_TABLET | Freq: Three times a day (TID) | ORAL | Status: DC | PRN
  Administered 2020-11-05: 50 mg via ORAL
  Administered 2020-11-05 – 2020-11-06 (×2): 100 mg via ORAL
  Administered 2020-11-07 (×2): 50 mg via ORAL
  Administered 2020-11-07 – 2020-11-19 (×25): 100 mg via ORAL
  Administered 2020-11-20: 50 mg via ORAL
  Administered 2020-11-20 – 2020-11-21 (×2): 100 mg via ORAL
  Filled 2020-11-05 (×3): qty 2
  Filled 2020-11-05: qty 1
  Filled 2020-11-05 (×2): qty 2
  Filled 2020-11-05: qty 1
  Filled 2020-11-05 (×25): qty 2
  Filled 2020-11-05: qty 1
  Filled 2020-11-05 (×2): qty 2

## 2020-11-05 MED ORDER — ONDANSETRON HCL 4 MG PO TABS: 4.0000 mg | ORAL_TABLET | Freq: Four times a day (QID) | ORAL | Status: DC | PRN

## 2020-11-05 MED ORDER — INSULIN ASPART 100 UNIT/ML ~~LOC~~ SOLN
6.0000 [IU] | Freq: Three times a day (TID) | SUBCUTANEOUS | Status: DC
  Administered 2020-11-05 – 2020-11-06 (×3): 6 [IU] via SUBCUTANEOUS

## 2020-11-05 MED ORDER — NYSTATIN 100000 UNIT/GM EX POWD
Freq: Two times a day (BID) | CUTANEOUS | Status: DC
  Administered 2020-11-20: 1 via TOPICAL
  Filled 2020-11-05: qty 15

## 2020-11-05 MED ORDER — ONDANSETRON HCL 4 MG/2ML IJ SOLN: 4.0000 mg | Freq: Four times a day (QID) | INTRAMUSCULAR | Status: DC | PRN

## 2020-11-05 MED ORDER — ACETAMINOPHEN 325 MG PO TABS
650.0000 mg | ORAL_TABLET | Freq: Four times a day (QID) | ORAL | Status: DC | PRN
  Administered 2020-11-06 – 2020-11-21 (×12): 650 mg via ORAL
  Filled 2020-11-05 (×13): qty 2

## 2020-11-05 MED ORDER — GABAPENTIN 300 MG PO CAPS
600.0000 mg | ORAL_CAPSULE | Freq: Every day | ORAL | Status: DC
  Administered 2020-11-05 – 2020-11-21 (×17): 600 mg via ORAL
  Filled 2020-11-05 (×18): qty 2

## 2020-11-05 NOTE — Progress Notes (Signed)
PROGRESS NOTE        PATIENT DETAILS Name: Tyler Brewer Age: 60 y.o. Sex: male Date of Birth: August 27, 1960 Admit Date: 11/04/2020 Admitting Physician Etta Quill, DO IFO:YDXAJ Koren Shiver, DO  Brief Narrative: Patient is a 60 y.o. male with history of DM-2, CAD, OSA-presenting to the hospital with frequent falls-patient has had progressive bilateral lower extremity weakness since August 2021.  Significant events: 3/18>> admit for lower extremity weakness  Significant studies: 2/23>> MRI thoracic spine: No acute abnormalities noted. 3/19>> MRI lumbar sacral spine: No acute process identified. 3/19>> MRI C-spine: Small disc herniation at C3-C4, C4-C5-no abnormal cord signal. 3/19>> vitamin B12: 538 3/19>> HIV: Nonreactive 3/19>> TSH: Normal limits  Antimicrobial therapy: None  Microbiology data: None  Procedures : None  Consults: Neurology  DVT Prophylaxis : Prophylactic Lovenox   Subjective: Claims left leg is weaker than the right.  Assessment/Plan: Bilateral lower extremity weakness-left>> right: Work-up in progress-neurology following-LP being contemplated.  Await further recommendations from neurology.  Patient unable to ambulate-await PT services as well.  Peripheral diabetic nephropathy: Continue Neurontin/Mobic and Cymbalta.  DM-2 (A1c 10.6 on 3/19): Continue Lantus 40 units daily, 6 units of NovoLog with meals and SSI-we will watch for 1 more day before adjusting any further  Recent Labs    11/04/20 2213 11/05/20 0729  GLUCAP 219* 250*   HTN: BP relatively well controlled-continue losartan  HLD: Continue statin  BPH: Continue Flomax   Obesity: Estimated body mass index is 38.35 kg/m as calculated from the following:   Height as of 10/31/20: 5\' 11"  (1.803 m).   Weight as of this encounter: 124.7 kg.    Diet: Diet Order            Diet Carb Modified Fluid consistency: Thin; Room service appropriate? Yes   Diet effective now                  Code Status: Full code   Family Communication: None at bedside-we will reach out to family over the next few days.  Disposition Plan: Status is: Observation  The patient will require care spanning > 2 midnights and should be moved to inpatient because: Inpatient level of care appropriate due to severity of illness  Dispo: The patient is from: Home              Anticipated d/c is to: To be determined              Patient currently is not medically stable to d/c.   Difficult to place patient No    Barriers to Discharge: Severe bilateral lower extremities weakness-unable to ambulate-work-up in process.  Needs inpatient work-up given severity of symptoms.  Antimicrobial agents: Anti-infectives (From admission, onward)   None       Time spent: 25- minutes-Greater than 50% of this time was spent in counseling, explanation of diagnosis, planning of further management, and coordination of care.  MEDICATIONS: Scheduled Meds: . [START ON 11/07/2020] DULoxetine  30 mg Oral Once per day on Mon Wed Fri  . enoxaparin (LOVENOX) injection  60 mg Subcutaneous Q24H  . gabapentin  300 mg Oral 2 times per day  . gabapentin  600 mg Oral Daily  . insulin aspart  0-15 Units Subcutaneous TID WC  . insulin aspart  6 Units Subcutaneous TID WC  . insulin glargine  40  Units Subcutaneous Daily  . losartan  100 mg Oral Daily  . pravastatin  40 mg Oral q1800  . tamsulosin  0.4 mg Oral Daily   Continuous Infusions: PRN Meds:.acetaminophen **OR** acetaminophen, diclofenac Sodium, meloxicam, methocarbamol, ondansetron **OR** ondansetron (ZOFRAN) IV, traMADol   PHYSICAL EXAM: Vital signs: Vitals:   11/05/20 0321 11/05/20 0330 11/05/20 0730 11/05/20 0900  BP: (!) 175/101 (!) 172/97 (!) 162/93 (!) 168/74  Pulse: 87 90 88 91  Resp: 20 19 19 16   Temp:   98.2 F (36.8 C)   TempSrc:   Oral   SpO2: 99% 98% 97% 99%  Weight:       Filed Weights   11/04/20  1409  Weight: 124.7 kg   Body mass index is 38.35 kg/m.   Gen Exam:Alert awake-not in any distress HEENT:atraumatic, normocephalic Chest: B/L clear to auscultation anteriorly CVS:S1S2 regular Abdomen:soft non tender, non distended Extremities:no edema Neurology: Left leg-barely able to lift against gravity-not able to lift against any resistance, right leg, able to lift against mild resistance Skin: no rash  I have personally reviewed following labs and imaging studies  LABORATORY DATA: CBC: Recent Labs  Lab 10/31/20 1619 11/04/20 1411  WBC 9.2 6.9  NEUTROABS 6.5  --   HGB 16.0 14.4  HCT 45.8 40.9  MCV 85.6 86.7  PLT 305 638    Basic Metabolic Panel: Recent Labs  Lab 10/31/20 1619 11/04/20 1411 11/05/20 0718  NA 128* 130*  --   K 4.2 3.9  --   CL 95* 99  --   CO2 23 20*  --   GLUCOSE 461* 439*  --   BUN 24* 23*  --   CREATININE 0.98 1.17  --   CALCIUM 9.1 8.8*  --   MG  --   --  1.9    GFR: Estimated Creatinine Clearance: 93.7 mL/min (by C-G formula based on SCr of 1.17 mg/dL).  Liver Function Tests: Recent Labs  Lab 10/31/20 1619  AST 16  ALT 26  ALKPHOS 95  BILITOT 0.6  PROT 7.2  ALBUMIN 3.8   No results for input(s): LIPASE, AMYLASE in the last 168 hours. No results for input(s): AMMONIA in the last 168 hours.  Coagulation Profile: No results for input(s): INR, PROTIME in the last 168 hours.  Cardiac Enzymes: Recent Labs  Lab 10/31/20 1634  CKTOTAL 31*    BNP (last 3 results) No results for input(s): PROBNP in the last 8760 hours.  Lipid Profile: No results for input(s): CHOL, HDL, LDLCALC, TRIG, CHOLHDL, LDLDIRECT in the last 72 hours.  Thyroid Function Tests: Recent Labs    11/05/20 0718  TSH 2.948    Anemia Panel: Recent Labs    11/05/20 0718  VITAMINB12 538    Urine analysis:    Component Value Date/Time   COLORURINE YELLOW 10/31/2020 1742   APPEARANCEUR CLEAR 10/31/2020 1742   LABSPEC 1.010 10/31/2020 1742    PHURINE 6.0 10/31/2020 1742   GLUCOSEU >=500 (A) 10/31/2020 1742   GLUCOSEU 100 (A) 09/25/2016 1041   HGBUR NEGATIVE 10/31/2020 1742   BILIRUBINUR NEGATIVE 10/31/2020 1742   BILIRUBINUR neg 04/26/2016 0836   KETONESUR NEGATIVE 10/31/2020 1742   PROTEINUR NEGATIVE 10/31/2020 1742   UROBILINOGEN 0.2 09/25/2016 1041   NITRITE NEGATIVE 10/31/2020 1742   LEUKOCYTESUR NEGATIVE 10/31/2020 1742    Sepsis Labs: Lactic Acid, Venous    Component Value Date/Time   LATICACIDVEN 1.3 01/21/2020 0915    MICROBIOLOGY: No results found for this or any previous  visit (from the past 240 hour(s)).  RADIOLOGY STUDIES/RESULTS: MR CERVICAL SPINE WO CONTRAST  Result Date: 11/05/2020 CLINICAL DATA:  60 year old male with progressive pain. Recurrent falls. Bilateral lower extremity weakness. Polysubstance abuse. Diabetes. EXAM: MRI CERVICAL SPINE WITHOUT CONTRAST TECHNIQUE: Multiplanar, multisequence MR imaging of the cervical spine was performed. No intravenous contrast was administered. COMPARISON:  CT cervical spine 11/01/2020. thoracic spine MRI 10/12/2020. FINDINGS: Alignment: Improved cervical lordosis from the recent CT. No significant spondylolisthesis. Vertebrae: No marrow edema or evidence of acute osseous abnormality. Visualized bone marrow signal is within normal limits. Cord: Cervical spinal cord appears to remain normal despite some mild degenerative cervical spinal stenosis detailed below. Negative visible upper thoracic cord. Posterior Fossa, vertebral arteries, paraspinal tissues: Cervicomedullary junction is within normal limits. There is patchy T2 and STIR hyperintensity in the central pons which appears nonspecific on series 7, image 8. Otherwise negative visible posterior fossa. Preserved major vascular flow voids in the neck. Vertebral arteries appear codominant. Negative visible neck soft tissues. Disc levels: C2-C3: Minor disc bulging, endplate spurring and ligament flavum hypertrophy without  stenosis. C3-C4: Small central disc protrusion with annular fissure (series 9, image 14). Otherwise mild leftward disc bulging and endplate spurring mostly affecting the foramen. Mild spinal stenosis. No significant cord mass effect. Mild to moderate left C4 foraminal stenosis. C4-C5: Circumferential disc bulge with broad-based posterior component effacing the ventral CSF space. Up to mild spinal stenosis. No cord mass effect. No significant foraminal stenosis. C5-C6:  Mild endplate spurring.  Mild left C6 foraminal stenosis. C6-C7: Left eccentric disc bulging, broad-based posterior component. Associated left foraminal disc and endplate spurring. No spinal stenosis. Moderate left C7 foraminal stenosis. C7-T1:  Negative. T1-T2 level appears stable, negative for age. IMPRESSION: 1. Small disc herniations at C3-C4 and C4-C5 result in up to mild spinal stenosis with no significant cord mass effect. No abnormal cord signal. 2. Up to moderate degenerative neural foraminal stenosis at the left C4 and left C7 nerve levels. Mild for age cervical spine degeneration otherwise. 3. Nonspecific T2 and STIR hyperintensity in the pons. Most commonly this would be due to chronic small vessel disease. Electronically Signed   By: Genevie Ann M.D.   On: 11/05/2020 07:34   MR Lumbar Spine W Wo Contrast  Result Date: 11/05/2020 CLINICAL DATA:  60 year old male with progressive pain. Recurrent falls. Bilateral lower extremity weakness. Polysubstance abuse. Diabetes. EXAM: MRI LUMBAR SPINE WITHOUT AND WITH CONTRAST TECHNIQUE: Multiplanar and multiecho pulse sequences of the lumbar spine were obtained without and with intravenous contrast. CONTRAST:  44mL GADAVIST GADOBUTROL 1 MMOL/ML IV SOLN COMPARISON:  Thoracic spine MRI 10/12/2020.  Lumbar MRI 08/17/2020. FINDINGS: Segmentation: Lumbar segmentation appears to be normal and this appears concordant with the thoracic spine numbering last month, also the prior lumbar numbering. Alignment:  Stable lordosis since December. Subtle degenerative retrolisthesis of L2 on L3. Vertebrae: Stable since December round vertebral body hemangioma located centrally and L3, benign. Normal background bone marrow signal. No marrow edema or acute osseous abnormality. Intact visible sacrum and SI joints. Conus medullaris and cauda equina: Conus extends to the T12-L1 level. No lower spinal cord or conus signal abnormality. Cauda equina nerve roots appear normal. No abnormal intradural enhancement or dural thickening. Paraspinal and other soft tissues: Stable and negative visible abdominal viscera. There is subtle lumbar rect or spine I muscle edema and enhancement on the right at L2-L3 (series 11, image 5). There is more moderate sacral erector spinae muscle edema and enhancement on the left (series  11, image 11. These are new since December. No intramuscular fluid collection. Other paraspinal soft tissues appear within normal limits. Disc levels: Negative visible lower thoracic levels. L1-L2: Stable minor disc bulging and endplate spurring without stenosis. L2-L3: Mild circumferential disc bulge. Small right paracentral disc protrusion and annular fissure appear mildly increased since December. But there is no significant spinal stenosis, mild if any associated right lateral recess stenosis (right L3 nerve level). L3-L4:  Stable mild disc bulge without stenosis. L4-L5:  Negative. L5-S1: Moderate left facet hypertrophy and degenerative facet joint fluid appear stable since December. No associated stenosis. IMPRESSION: 1. Mild nonspecific inflammation in the right erector spinae muscle at L2-L3, and on the left at S1-S2. These might be posttraumatic in this setting. 2. No other acute or inflammatory process identified in the lumbar spine. 3. Mild lumbar spine degeneration without spinal stenosis. Rightward L2-L3 disc protrusion with annular fissure does appear mildly increased since December. Query right L3 radiculitis.  Moderately advanced chronic facet degeneration on the left at L5-S1. Electronically Signed   By: Genevie Ann M.D.   On: 11/05/2020 07:41     LOS: 0 days   Oren Binet, MD  [facility]    To contact the attending provider between 7A-7P or the covering provider during after hours 7P-7A, please log into the web site [email or web address] and access using universal [place] password for that web site. If you do not have the password, please call the hospital operator.  11/05/2020, 9:45 AM
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
## 2020-11-05 NOTE — Progress Notes (Signed)
Inpatient Rehab Admissions Coordinator Note:   Per PT recommendation, pt was screened for CIR candidacy by Gayland Curry, MS, CCC-SLP.  Note pt is under observation status at this time.  Pt may not have the medical necessity to warrant an inpatient rehab stay if they remain observation.  If status were to change to inpatient, [facility] will screen for candidacy.      Gayland Curry, [place], [place] Admissions Coordinator [phone] 11/05/20 4:08 PM

## 2020-11-05 NOTE — ED Notes (Signed)
Attempted report x 2 
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
## 2020-11-05 NOTE — Progress Notes (Signed)
NEW ADMISSION NOTE New Admission Note:   Arrival Method: stretcher from ED  Mental Orientation: axox4 Telemetry: none ordered  Assessment: Completed Skin: see skin assessment  IV: see flowsheet  Pain: none  Tubes: n/a Safety Measures: Safety Fall Prevention Plan has been  discussed  Admission:To be Completed [facility] [place] Orientation: Patient has been orientated to the room, unit and staff.  Family: none at the bedside   Orders have been reviewed and implemented. Will continue to monitor the patient. Call light has been placed within reach and bed alarm has been activated.   Paulla Fore, RN, BSN
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
## 2020-11-05 NOTE — Progress Notes (Signed)
Brief Neurology Update  Please see full consult note from Dr. Lorrin Goodell same day.  Review of interim results:  MRI L spine 11/05/20 1. Mild nonspecific inflammation in the right erector spinae muscle at L2-L3, and on the left at S1-S2. These might be posttraumatic in this setting.  2. No other acute or inflammatory process identified in the lumbar spine.  3. Mild lumbar spine degeneration without spinal stenosis. Rightward L2-L3 disc protrusion with annular fissure does appear mildly increased since December. Query right L3 radiculitis. Moderately advanced chronic facet degeneration on the left at L5-S1.  MRI c spine wo contrast 11/05/20  1. Small disc herniations at C3-C4 and C4-C5 result in up to mild spinal stenosis with no significant cord mass effect. No abnormal cord signal. 2. Up to moderate degenerative neural foraminal stenosis at the left C4 and left C7 nerve levels. Mild for age cervical spine degeneration otherwise. 3. Nonspecific T2 and STIR hyperintensity in the pons. Most commonly this would be due to chronic small vessel disease.  Prior to admission: MRI t spine wo contrast 11/05/20  Mildly motion degraded exam.  Mild thoracic spondylosis as described. Most notably, a small T4-T5 right center disc protrusion mildly narrows the spinal canal, contacting and mildly flattening the ventral spinal cord.  No significant disc herniation or spinal canal stenosis at the remaining levels. No significant foraminal stenosis within the thoracic spine.  TSH 2.948 B12 538 HIV NR  Studies to date do not identify etiology of his presenting sx.  Plan: - F/u HSV 1 and 2 PCR, West Nile Virus panel - Re-examine tmrw (3/20). If no improvement and no etiology identified consider LP w/ CSF cell count and differential x2, protein and glucose, IgG index, oligoclonal bands, cytology.   Su Monks, MD [facility] [phone]  If 7pm- 7am, please page  neurology on call as listed in [place].

## 2020-11-05 NOTE — ED Notes (Signed)
Attempted report x1. 

## 2020-11-05 NOTE — Progress Notes (Signed)
Pt stated that he cannot feel his left foot very well and pt had to have a BM. This RN told pt that he needed to use a bedpan because this RN did not have a tech to help and that it was for safety reasons due to pt not being able to feel foot. Pt stated that he was not using a bedpan and insisted on getting up. Pt could barely get to his feet but was able to ambulate. Pt is refusing the bed alarm and is not listening to RNs requests regarding pts safety. This RN will continue to monitor.

## 2020-11-06 ENCOUNTER — Inpatient Hospital Stay (HOSPITAL_COMMUNITY): Payer: Medicare HMO

## 2020-11-06 DIAGNOSIS — D1809 Hemangioma of other sites: Secondary | ICD-10-CM | POA: Diagnosis not present

## 2020-11-06 DIAGNOSIS — Z79899 Other long term (current) drug therapy: Secondary | ICD-10-CM | POA: Diagnosis not present

## 2020-11-06 DIAGNOSIS — G4733 Obstructive sleep apnea (adult) (pediatric): Secondary | ICD-10-CM | POA: Diagnosis present

## 2020-11-06 DIAGNOSIS — Z9119 Patient's noncompliance with other medical treatment and regimen: Secondary | ICD-10-CM | POA: Diagnosis not present

## 2020-11-06 DIAGNOSIS — E222 Syndrome of inappropriate secretion of antidiuretic hormone: Secondary | ICD-10-CM | POA: Diagnosis not present

## 2020-11-06 DIAGNOSIS — E1142 Type 2 diabetes mellitus with diabetic polyneuropathy: Secondary | ICD-10-CM | POA: Diagnosis not present

## 2020-11-06 DIAGNOSIS — I10 Essential (primary) hypertension: Secondary | ICD-10-CM | POA: Diagnosis not present

## 2020-11-06 DIAGNOSIS — R29898 Other symptoms and signs involving the musculoskeletal system: Secondary | ICD-10-CM

## 2020-11-06 DIAGNOSIS — H5712 Ocular pain, left eye: Secondary | ICD-10-CM | POA: Diagnosis not present

## 2020-11-06 DIAGNOSIS — L538 Other specified erythematous conditions: Secondary | ICD-10-CM | POA: Diagnosis not present

## 2020-11-06 DIAGNOSIS — Z6841 Body Mass Index (BMI) 40.0 and over, adult: Secondary | ICD-10-CM | POA: Diagnosis not present

## 2020-11-06 DIAGNOSIS — R296 Repeated falls: Secondary | ICD-10-CM | POA: Diagnosis not present

## 2020-11-06 DIAGNOSIS — M5124 Other intervertebral disc displacement, thoracic region: Secondary | ICD-10-CM | POA: Diagnosis not present

## 2020-11-06 DIAGNOSIS — E1144 Type 2 diabetes mellitus with diabetic amyotrophy: Secondary | ICD-10-CM | POA: Diagnosis present

## 2020-11-06 DIAGNOSIS — Z794 Long term (current) use of insulin: Secondary | ICD-10-CM | POA: Diagnosis not present

## 2020-11-06 DIAGNOSIS — H5789 Other specified disorders of eye and adnexa: Secondary | ICD-10-CM | POA: Diagnosis not present

## 2020-11-06 DIAGNOSIS — E1165 Type 2 diabetes mellitus with hyperglycemia: Secondary | ICD-10-CM | POA: Diagnosis not present

## 2020-11-06 DIAGNOSIS — R531 Weakness: Secondary | ICD-10-CM | POA: Diagnosis not present

## 2020-11-06 DIAGNOSIS — E7800 Pure hypercholesterolemia, unspecified: Secondary | ICD-10-CM | POA: Diagnosis present

## 2020-11-06 DIAGNOSIS — Z20822 Contact with and (suspected) exposure to covid-19: Secondary | ICD-10-CM | POA: Diagnosis not present

## 2020-11-06 DIAGNOSIS — L03113 Cellulitis of right upper limb: Secondary | ICD-10-CM | POA: Diagnosis not present

## 2020-11-06 DIAGNOSIS — G6181 Chronic inflammatory demyelinating polyneuritis: Secondary | ICD-10-CM | POA: Diagnosis not present

## 2020-11-06 DIAGNOSIS — Z4901 Encounter for fitting and adjustment of extracorporeal dialysis catheter: Secondary | ICD-10-CM | POA: Diagnosis not present

## 2020-11-06 DIAGNOSIS — I808 Phlebitis and thrombophlebitis of other sites: Secondary | ICD-10-CM | POA: Diagnosis not present

## 2020-11-06 DIAGNOSIS — E1140 Type 2 diabetes mellitus with diabetic neuropathy, unspecified: Secondary | ICD-10-CM | POA: Diagnosis not present

## 2020-11-06 DIAGNOSIS — E669 Obesity, unspecified: Secondary | ICD-10-CM | POA: Diagnosis present

## 2020-11-06 DIAGNOSIS — N400 Enlarged prostate without lower urinary tract symptoms: Secondary | ICD-10-CM | POA: Diagnosis not present

## 2020-11-06 DIAGNOSIS — I2510 Atherosclerotic heart disease of native coronary artery without angina pectoris: Secondary | ICD-10-CM | POA: Diagnosis present

## 2020-11-06 DIAGNOSIS — M79601 Pain in right arm: Secondary | ICD-10-CM | POA: Diagnosis not present

## 2020-11-06 DIAGNOSIS — Z791 Long term (current) use of non-steroidal anti-inflammatories (NSAID): Secondary | ICD-10-CM | POA: Diagnosis not present

## 2020-11-06 DIAGNOSIS — Z833 Family history of diabetes mellitus: Secondary | ICD-10-CM | POA: Diagnosis not present

## 2020-11-06 DIAGNOSIS — Z8249 Family history of ischemic heart disease and other diseases of the circulatory system: Secondary | ICD-10-CM | POA: Diagnosis not present

## 2020-11-06 DIAGNOSIS — H11422 Conjunctival edema, left eye: Secondary | ICD-10-CM | POA: Diagnosis not present

## 2020-11-06 DIAGNOSIS — M7989 Other specified soft tissue disorders: Secondary | ICD-10-CM | POA: Diagnosis not present

## 2020-11-06 DIAGNOSIS — F32A Depression, unspecified: Secondary | ICD-10-CM | POA: Diagnosis present

## 2020-11-06 DIAGNOSIS — Z87891 Personal history of nicotine dependence: Secondary | ICD-10-CM | POA: Diagnosis not present

## 2020-11-06 LAB — GLUCOSE, CAPILLARY
Glucose-Capillary: 258 mg/dL — ABNORMAL HIGH (ref 70–99)
Glucose-Capillary: 154 mg/dL — ABNORMAL HIGH (ref 70–99)
Glucose-Capillary: 110 mg/dL — ABNORMAL HIGH (ref 70–99)
Glucose-Capillary: 230 mg/dL — ABNORMAL HIGH (ref 70–99)

## 2020-11-06 IMAGING — MR MR THORACIC SPINE WO/W CM
5 of 9 series · 19 of 48 positions shown · IV contrast (cc GAD)
Comparison: MRI thoracic spine [DATE].

CLINICAL DATA: Progressive bilateral lower extremity weakness
resulting in falls [DATE] and [DATE].

EXAM:
MRI THORACIC WITHOUT AND WITH CONTRAST
TECHNIQUE: Multiplanar and multiecho pulse sequences of the thoracic spine were
obtained without and with intravenous contrast.
CONTRAST:  10 mL GADAVIST IV SOLN

[Series 2: T1 · sagittal · 3.0mm · 0.90mm/px · 2 of 12 slices shown (1 of 3)]
[im 1/12]
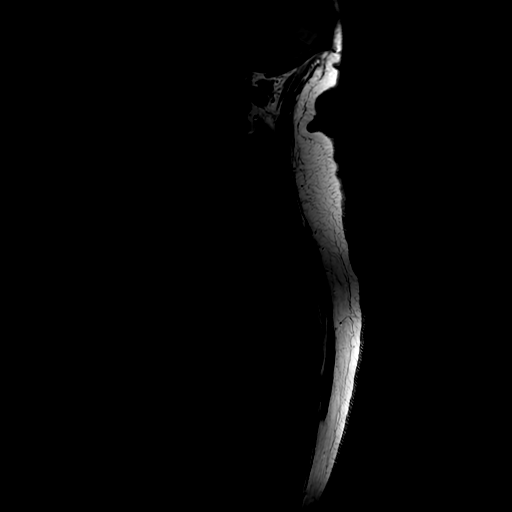
[im 12/12]
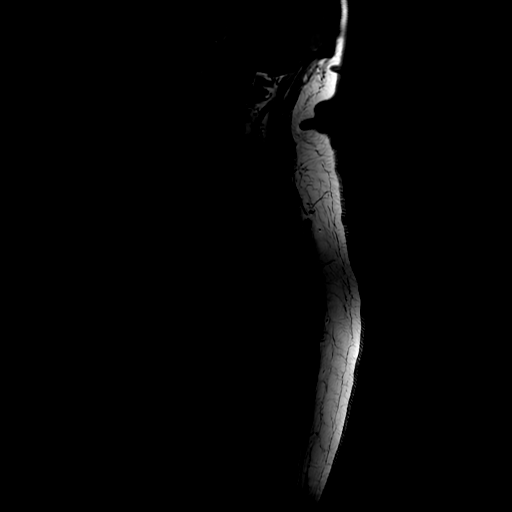

[Series 5: T2 · sagittal · 3.0mm · 0.66mm/px · 3 of 17 slices shown (1 of 2)]
[im 1/17]
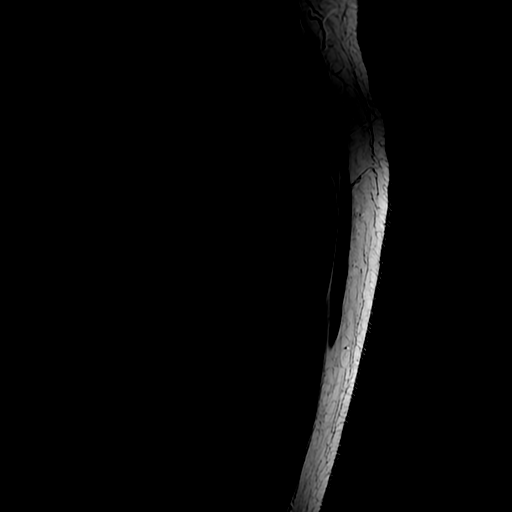
[im 9/17]
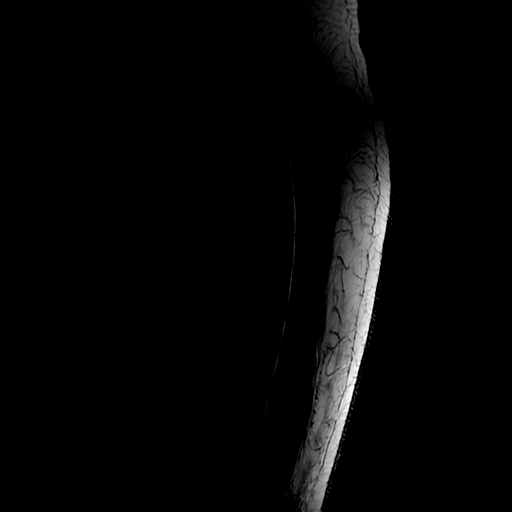
[im 17/17]
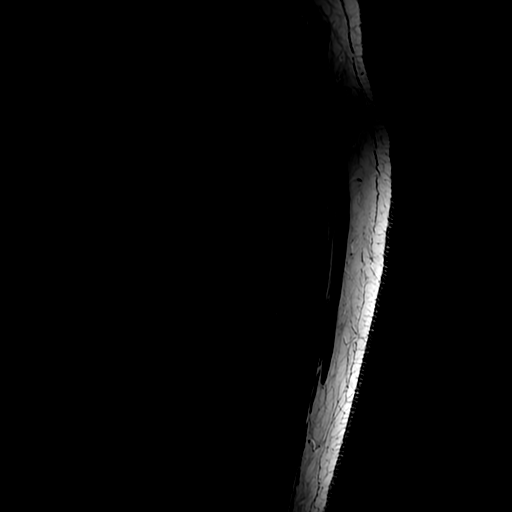

[Series 7: T1 · sagittal · 3.0mm · 0.66mm/px · 3 of 17 slices shown (2 of 3)]
[im 1/17]
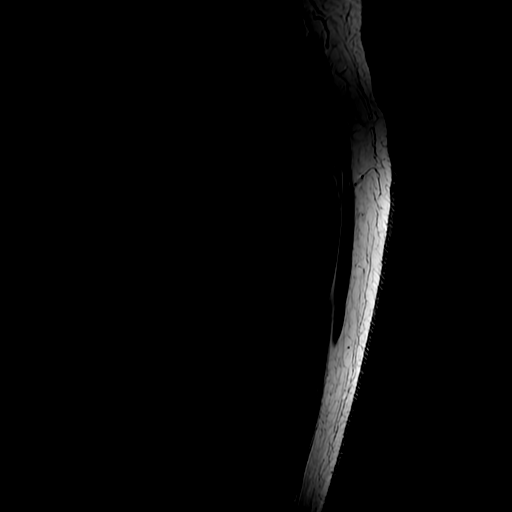
[im 9/17]
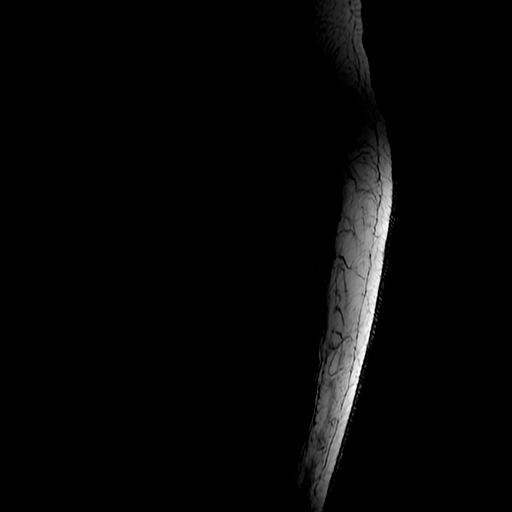
[im 17/17]
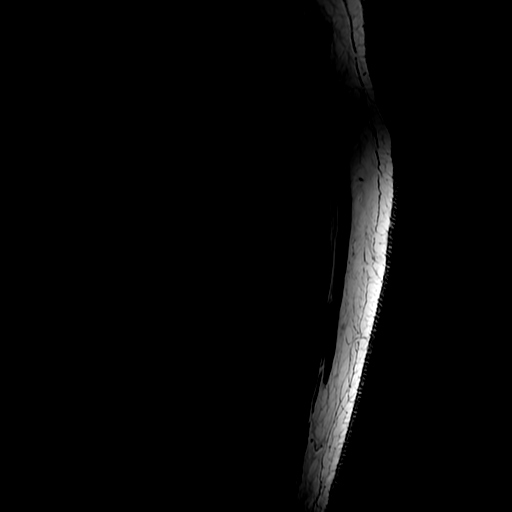

[Series 9: T2 · axial · 4.0mm · 0.39mm/px · z∈[-375,-133]mm · 6 of 36 slices shown (2 of 2)]
[im 1/36]
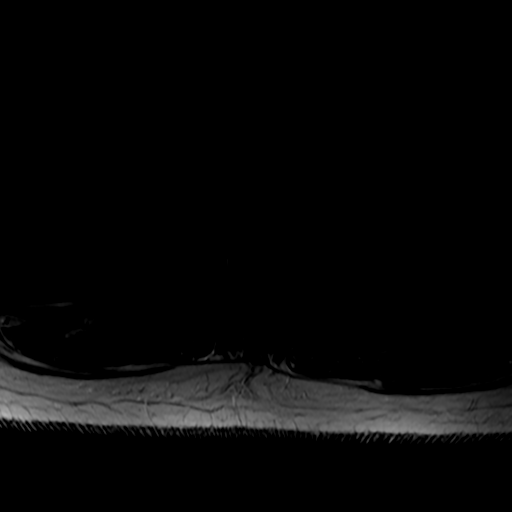
[im 8/36]
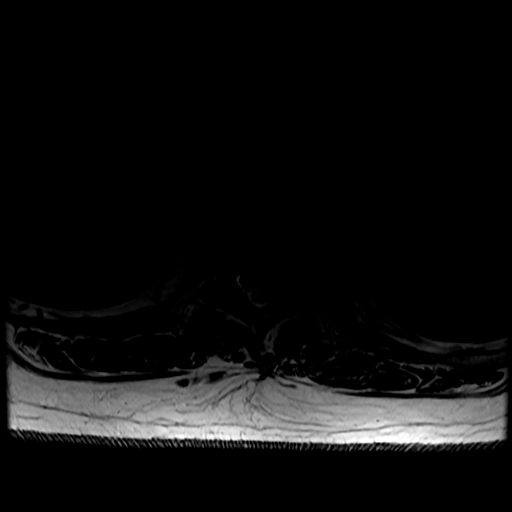
[im 15/36]
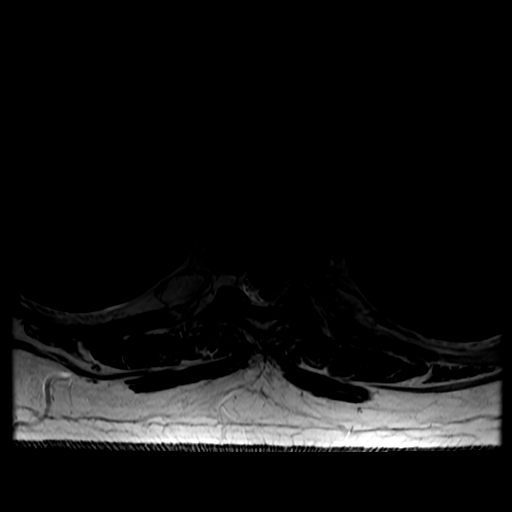
[im 22/36]
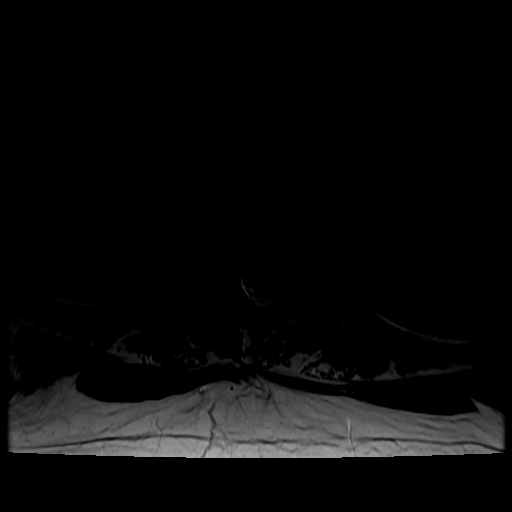
[im 29/36]
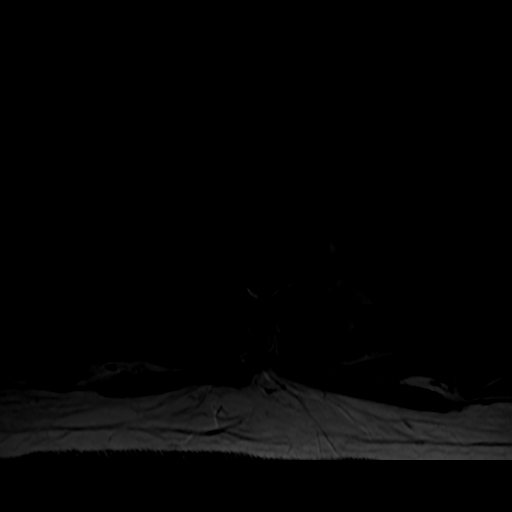
[im 36/36]
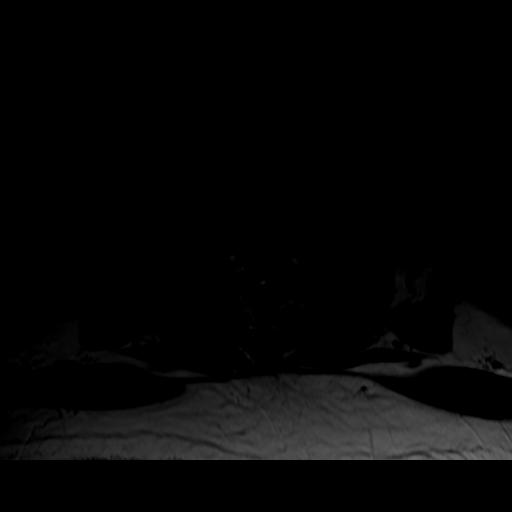

[Series 11: T1 · axial · non-contrast · 3.0mm · 0.39mm/px · z∈[-376,-212]mm · 5 of 60 slices shown (3 of 3)]
[im 1/60]
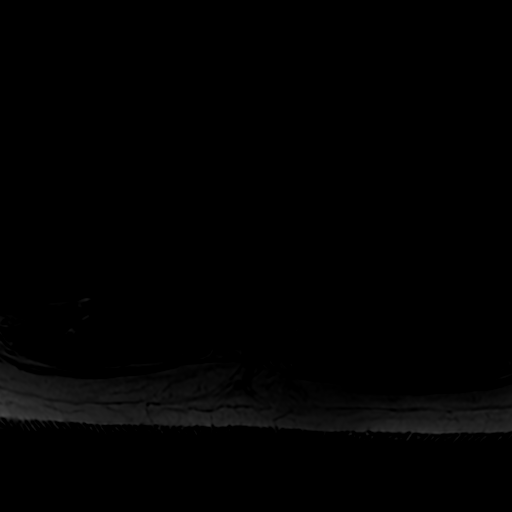
[im 12/60]
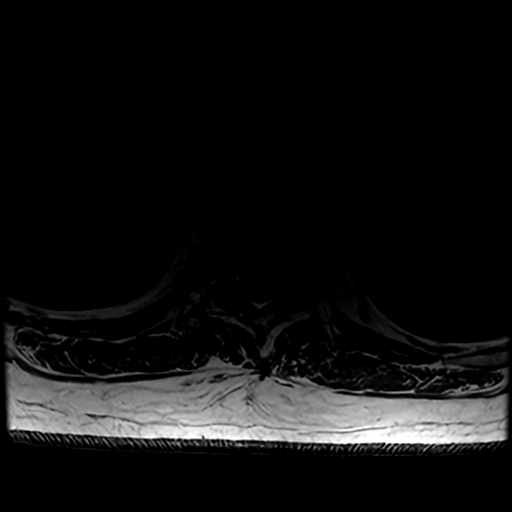
[im 18/60]
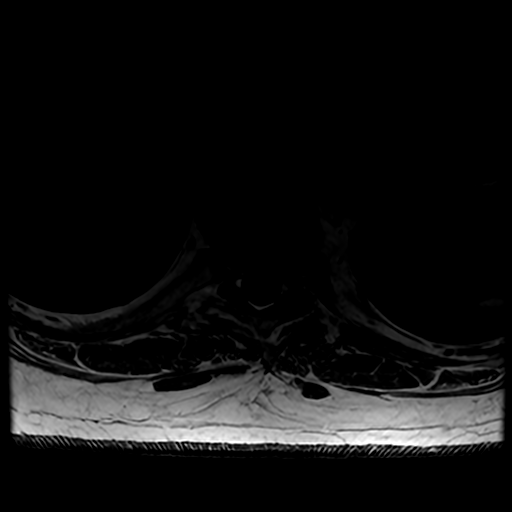
[im 24/60]
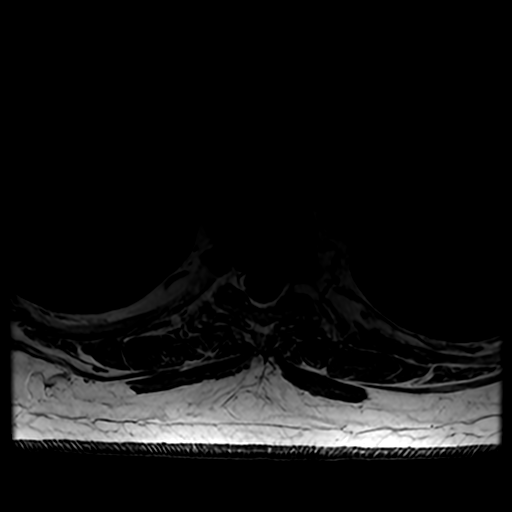
[im 36/60]
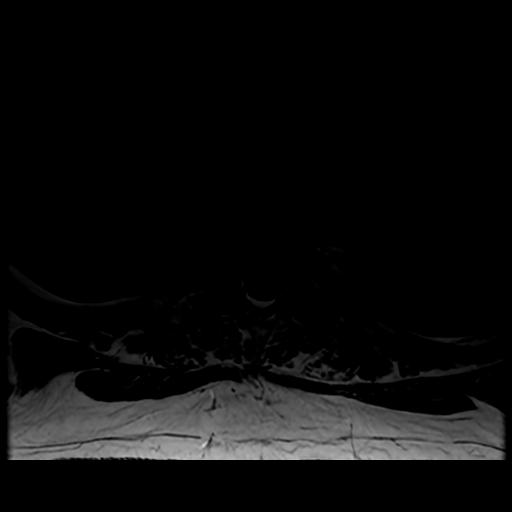

[19 of 48 positions shown; findings below may reference images not displayed]

FINDINGS: Alignment:  Normal.

Vertebrae: No fracture, worrisome lesion or evidence of discitis. A
few small Schmorl's nodes are again seen. Small hemangiomas in T9
and T10 are unchanged.

Cord: Normal signal throughout. No pathologic enhancement after
contrast administration.

Paraspinal and other soft tissues: Negative.

Disc levels:

No change in mild thoracic spondylosis most notable at T4-5 where
there is a very shallow right paracentral protrusion without
stenosis.
IMPRESSION: Normal appearing thoracic cord. No finding to explain the patient's
symptoms.

No change in small right paracentral protrusion at T4-5 without
stenosis.

## 2020-11-06 IMAGING — MR MR CERVICAL SPINE W/ CM
4 series · 20 of 48 positions shown · IV contrast (gadavist)
Comparison: MRI cervical spine [DATE].

CLINICAL DATA: Recurrent falls due to progressive bilateral lower
extremity weakness, most recently [DATE] and [DATE].

EXAM:
MRI CERVICAL SPINE WITH CONTRAST
TECHNIQUE: Multiplanar, multisequence MR imaging of the cervical spine was
performed following the administration of intravenous contrast.
CONTRAST:  10 mL Gadavist IV.

[Series 12: FLAIR · sagittal · 3.0mm · 0.43mm/px · 3 of 16 slices shown]
[im 3/16]
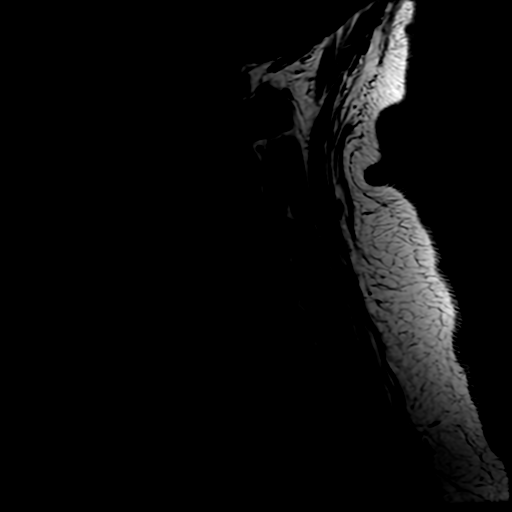
[im 8/16]
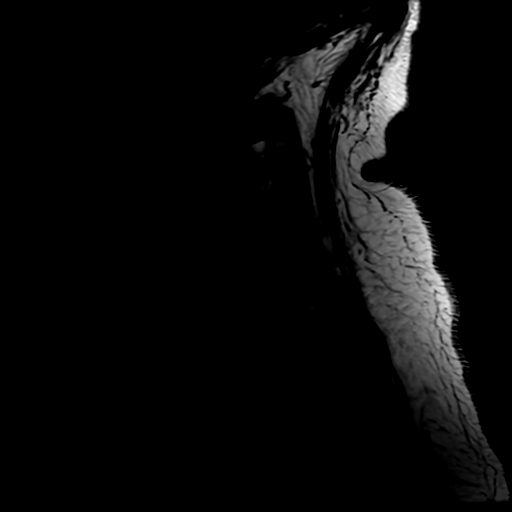
[im 13/16]
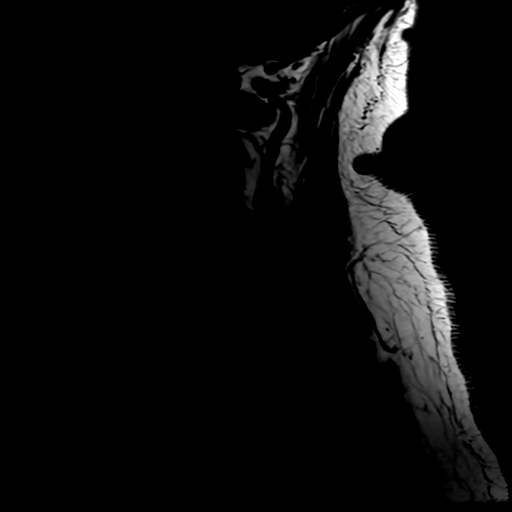

[Series 13: T1 · axial · non-contrast · 3.0mm · 0.35mm/px · z∈[-127,-30]mm · 11 of 36 slices shown]
[im 3/36]
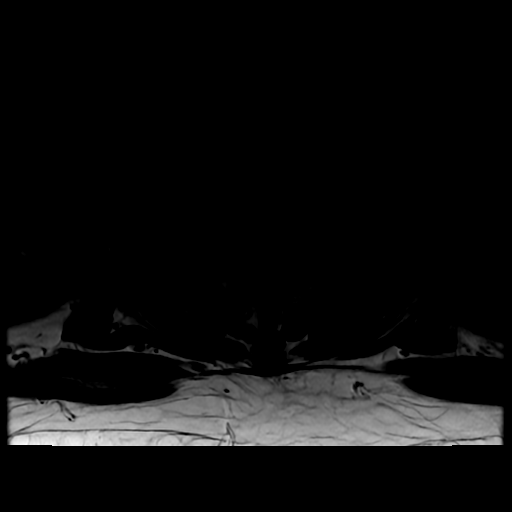
[im 5/36]
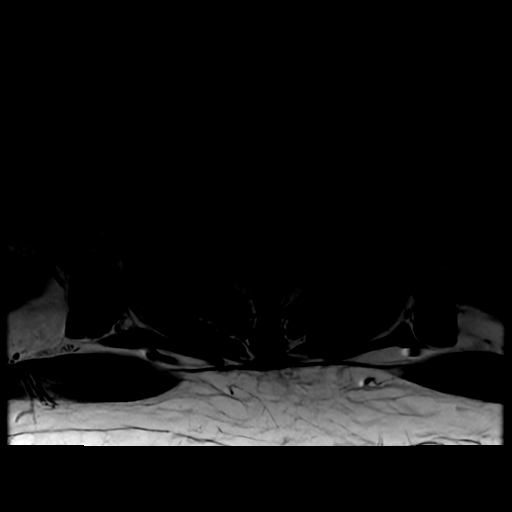
[im 7/36]
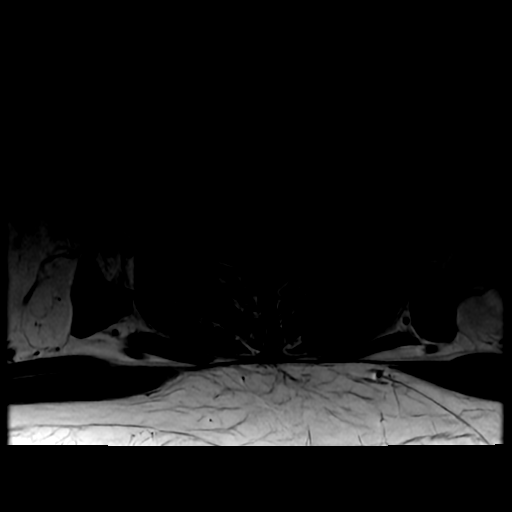
[im 11/36]
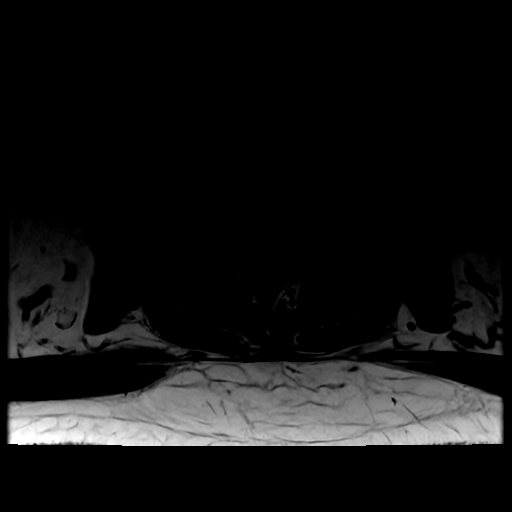
[im 16/36]
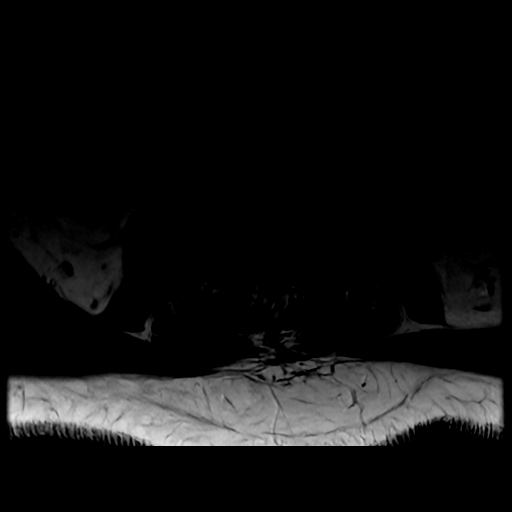
[im 18/36]
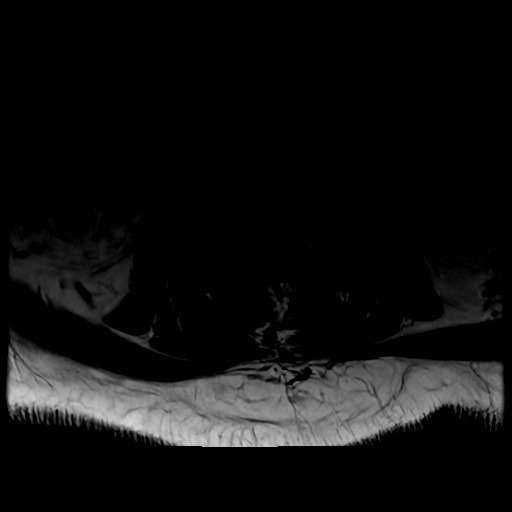
[im 20/36]
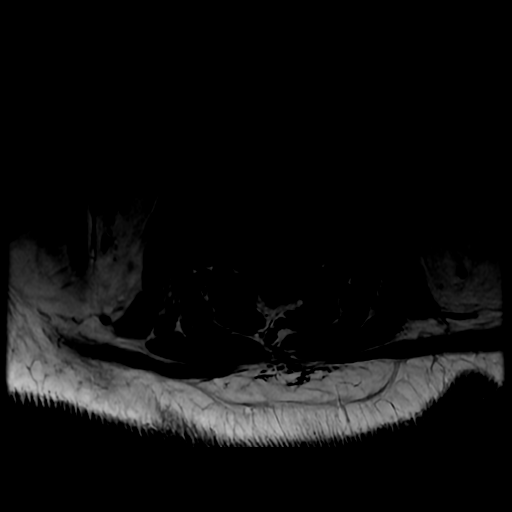
[im 25/36]
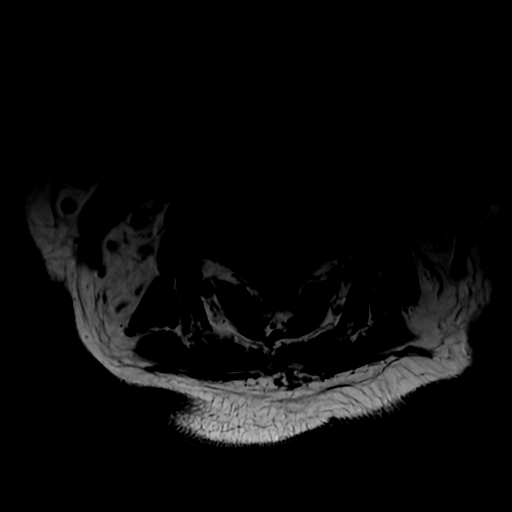
[im 29/36]
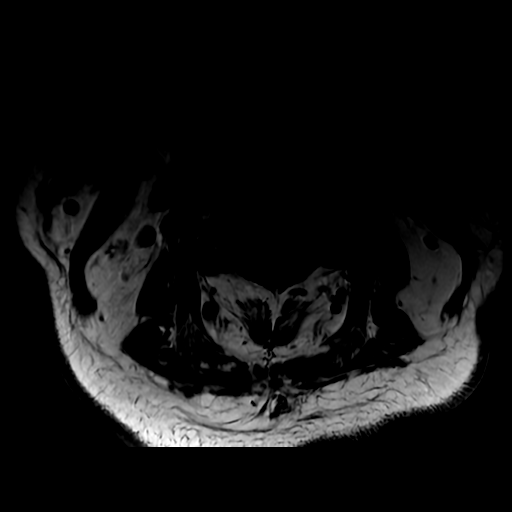
[im 31/36]
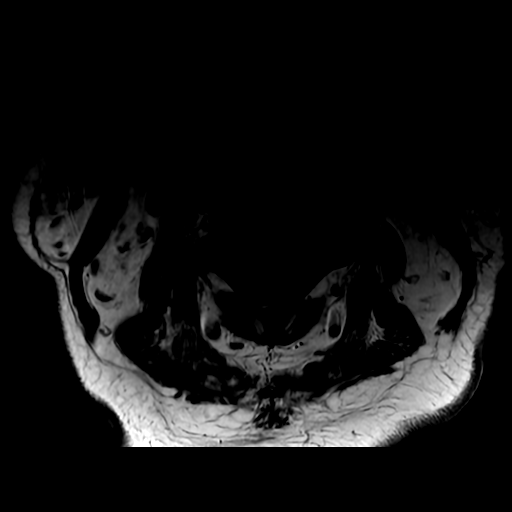
[im 33/36]
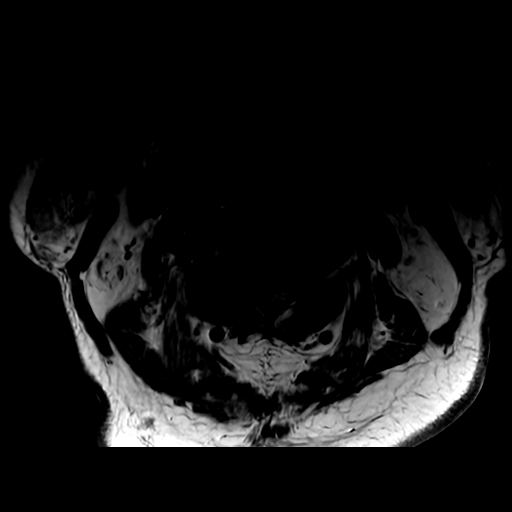

[Series 14: T1 fat-sat post-contrast · sagittal · 3.0mm · 0.43mm/px · 3 of 16 slices shown]
[im 3/16]
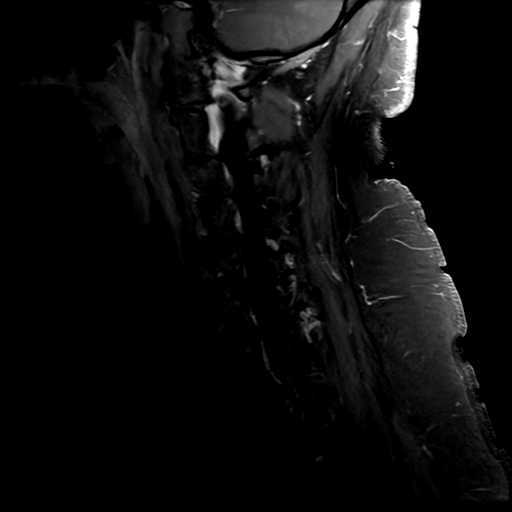
[im 8/16]
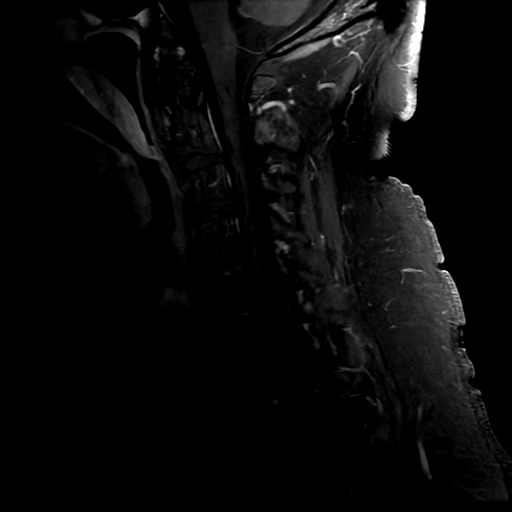
[im 13/16]
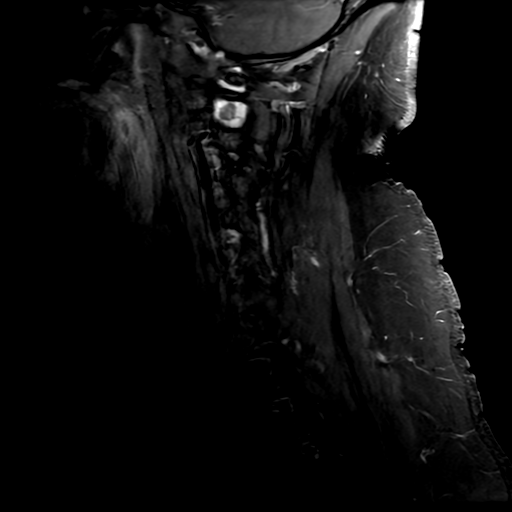

[Series 15: T1 post-contrast · axial · 3.0mm · 0.35mm/px · z∈[-121,-37]mm · 3 of 36 slices shown]
[im 5/36]
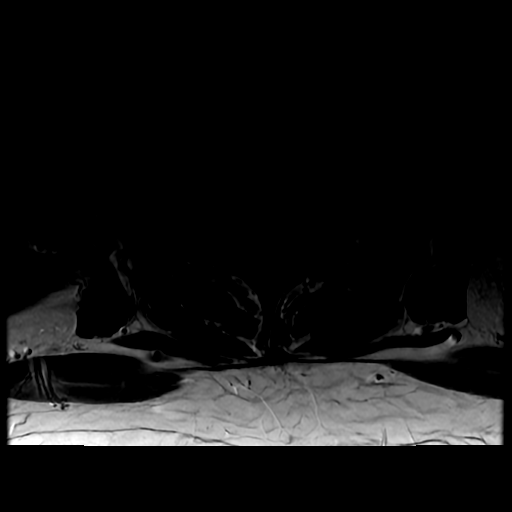
[im 18/36]
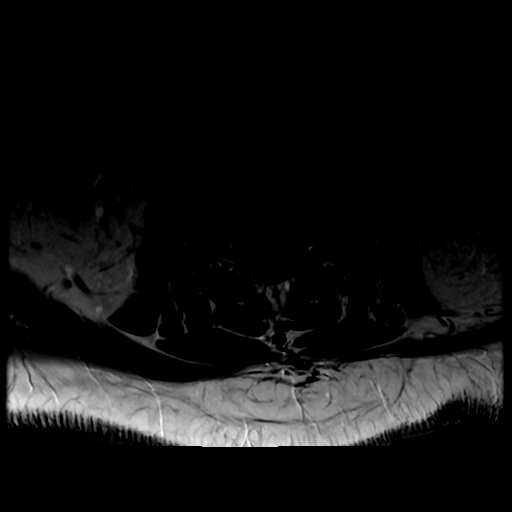
[im 31/36]
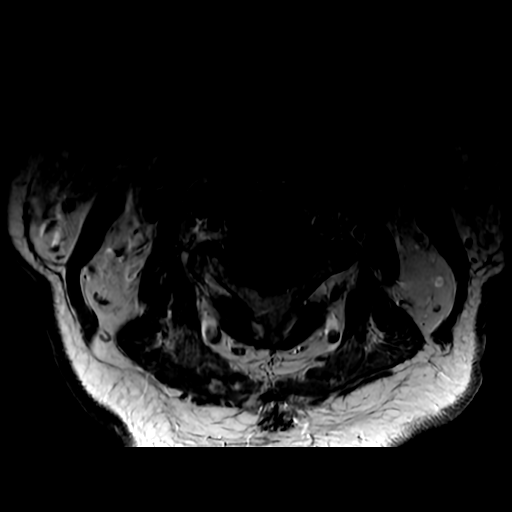

[20 of 48 positions shown; findings below may reference images not displayed]

FINDINGS: No new abnormality is seen compared to the prior examination. There
is no pathologic enhancement of the spinal cord or any other
structure after contrast administration.
IMPRESSION: Normal appearing cervical cord. Negative for pathologic enhancement.

## 2020-11-06 MED ORDER — INSULIN ASPART 100 UNIT/ML ~~LOC~~ SOLN
8.0000 [IU] | Freq: Three times a day (TID) | SUBCUTANEOUS | Status: DC
  Administered 2020-11-06 – 2020-11-21 (×40): 8 [IU] via SUBCUTANEOUS

## 2020-11-06 MED ORDER — INSULIN GLARGINE 100 UNIT/ML ~~LOC~~ SOLN
44.0000 [IU] | Freq: Every day | SUBCUTANEOUS | Status: DC
  Administered 2020-11-07 – 2020-11-13 (×7): 44 [IU] via SUBCUTANEOUS
  Filled 2020-11-06 (×9): qty 0.44

## 2020-11-06 MED ORDER — POLYETHYLENE GLYCOL 3350 17 G PO PACK
17.0000 g | PACK | Freq: Every day | ORAL | Status: DC | PRN
  Administered 2020-11-06: 17 g via ORAL
  Filled 2020-11-06 (×3): qty 1

## 2020-11-06 MED ORDER — GADOBUTROL 1 MMOL/ML IV SOLN
10.0000 mL | Freq: Once | INTRAVENOUS | Status: AC | PRN
  Administered 2020-11-06: 10 mL via INTRAVENOUS

## 2020-11-06 NOTE — Progress Notes (Signed)
Brief Neurology Update  The following studies have been performed during this admission or prior to admission (after patient developed sx) and have not identified an etiology:  MRI c spine wo MRI t spine wo MRI l spine wwo  - I have ordered repeat t spine wwo and post-gad sequences to supplement noncontrast MR c spine performed yesterday.  - If unrevealing consider LP  Will continue to follow   Su Monks, MD [facility] [phone]  If 7pm- 7am, please page neurology on call as listed in [place].
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
## 2020-11-06 NOTE — Progress Notes (Addendum)
PROGRESS NOTE        PATIENT DETAILS Name: Tyler Brewer Age: 60 y.o. Sex: male Date of Birth: 1961-06-08 Admit Date: 11/04/2020 Admitting Physician Etta Quill, DO PHK:FEXMD Koren Shiver, DO  Brief Narrative: Patient is a 60 y.o. male with history of DM-2, CAD, OSA-presenting to the hospital with frequent falls-patient has had progressive bilateral lower extremity weakness since August 2021.  Significant events: 3/18>> admit for lower extremity weakness  Significant studies: 2/23>> MRI thoracic spine: No acute abnormalities noted. 3/19>> MRI lumbar sacral spine: No acute process identified. 3/19>> MRI C-spine: Small disc herniation at C3-C4, C4-C5-no abnormal cord signal. 3/19>> vitamin B12: 538 3/19>> HIV: Nonreactive 3/19>> TSH: Normal limits  Antimicrobial therapy: None  Microbiology data: None  Procedures : None  Consults: Neurology  DVT Prophylaxis : Prophylactic Lovenox   Subjective: Unchanged-no improvement in lower extremity weakness.  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  Neurology planning on LP.  Evaluated by PT-recommendations are for CIR.  Peripheral diabetic nephropathy: Continue Neurontin/Mobic and Cymbalta.  DM-2 (A1c 10.6 on 3/19): CBGs on the higher side-increase Lantus to 44 units daily-increase Premeal NovoLog to 8 units-continue SSI-follow and adjust.   Recent Labs    11/05/20 1130 11/05/20 1707 11/06/20 0858  GLUCAP 264* 193* 258*   HTN: BP relatively well controlled-continue losartan  HLD: Continue statin  BPH: Continue Flomax  Obesity: Estimated body mass index is 38.35 kg/m as calculated from the following:   Height as of 10/31/20: 5\' 11"  (1.803 m).   Weight as of this encounter: 124.7 kg.     Diet: Diet Order            Diet Carb Modified Fluid consistency: Thin; Room service appropriate? Yes  Diet effective now                  Code Status: Full code   Family Communication: None at bedside-we will reach out to family over the next few days.  Disposition Plan: Status is: Observation  The patient will require care spanning > 2 midnights and should be moved to inpatient because: Inpatient level of care appropriate due to severity of illness  Dispo: The patient is from: Home              Anticipated d/c is to: To be determined              Patient currently is not medically stable to d/c.   Difficult to place patient No    Barriers to Discharge: Severe bilateral lower extremities weakness-unable to ambulate-work-up in process.  Needs inpatient work-up given severity of symptoms.  Antimicrobial agents: Anti-infectives (From admission, onward)   None       Time spent: 25- minutes-Greater than 50% of this time was spent in counseling, explanation of diagnosis, planning of further management, and coordination of care.  MEDICATIONS: Scheduled Meds: . [START ON 11/07/2020] DULoxetine  30 mg Oral Once per day on Mon Wed Fri  . enoxaparin (LOVENOX) injection  60 mg Subcutaneous Q24H  . gabapentin  300 mg Oral 2 times per day  . gabapentin  600 mg Oral Daily  . insulin aspart  0-15 Units Subcutaneous TID WC  . insulin aspart  6 Units  Subcutaneous TID WC  . insulin glargine  40 Units Subcutaneous Daily  . losartan  100 mg Oral Daily  . nystatin   Topical BID  . pravastatin  40 mg Oral q1800  . tamsulosin  0.4 mg Oral Daily   Continuous Infusions: PRN Meds:.acetaminophen **OR** acetaminophen, diclofenac Sodium, meloxicam, methocarbamol, ondansetron **OR** ondansetron (ZOFRAN) IV, traMADol   PHYSICAL EXAM: Vital signs: Vitals:   11/05/20 1721 11/05/20 1808 11/05/20 2116 11/06/20 1014  BP: (!) 146/105 (!) 150/85 (!) 153/80 (!) 161/78  Pulse: 83 81 89 84   Resp: 20 16 16 18   Temp: 97.7 F (36.5 C) 97.8 F (36.6 C) 98.6 F (37 C) 97.8 F (36.6 C)  TempSrc: Oral Oral Oral   SpO2: 96% 100% 100% 100%  Weight:       Filed Weights   11/04/20 1409  Weight: 124.7 kg   Body mass index is 38.35 kg/m.   Gen Exam:Alert awake-not in any distress HEENT:atraumatic, normocephalic Chest: B/L clear to auscultation anteriorly CVS:S1S2 regular Abdomen:soft non tender, non distended Extremities:no edema Neurology: Left leg-unable to lift against resistance-right leg-able to lift up against mild resistance. Skin: no rash I have personally reviewed following labs and imaging studies  LABORATORY DATA: CBC: Recent Labs  Lab 10/31/20 1619 11/04/20 1411  WBC 9.2 6.9  NEUTROABS 6.5  --   HGB 16.0 14.4  HCT 45.8 40.9  MCV 85.6 86.7  PLT 305 194    Basic Metabolic Panel: Recent Labs  Lab 10/31/20 1619 11/04/20 1411 11/05/20 0718  NA 128* 130*  --   K 4.2 3.9  --   CL 95* 99  --   CO2 23 20*  --   GLUCOSE 461* 439*  --   BUN 24* 23*  --   CREATININE 0.98 1.17  --   CALCIUM 9.1 8.8*  --   MG  --   --  1.9    GFR: Estimated Creatinine Clearance: 93.7 mL/min (by C-G formula based on SCr of 1.17 mg/dL).  Liver Function Tests: Recent Labs  Lab 10/31/20 1619  AST 16  ALT 26  ALKPHOS 95  BILITOT 0.6  PROT 7.2  ALBUMIN 3.8   No results for input(s): LIPASE, AMYLASE in the last 168 hours. No results for input(s): AMMONIA in the last 168 hours.  Coagulation Profile: No results for input(s): INR, PROTIME in the last 168 hours.  Cardiac Enzymes: Recent Labs  Lab 10/31/20 1634  CKTOTAL 31*    BNP (last 3 results) No results for input(s): PROBNP in the last 8760 hours.  Lipid Profile: No results for input(s): CHOL, HDL, LDLCALC, TRIG, CHOLHDL, LDLDIRECT in the last 72 hours.  Thyroid Function Tests: Recent Labs    11/05/20 0718  TSH 2.948    Anemia Panel: Recent Labs    11/05/20 0718  VITAMINB12 538     Urine analysis:    Component Value Date/Time   COLORURINE YELLOW 11/05/2020 2029   APPEARANCEUR HAZY (A) 11/05/2020 2029   LABSPEC 1.028 11/05/2020 2029   PHURINE 5.0 11/05/2020 2029   GLUCOSEU >=500 (A) 11/05/2020 2029   GLUCOSEU 100 (A) 09/25/2016 1041   HGBUR NEGATIVE 11/05/2020 2029   BILIRUBINUR NEGATIVE 11/05/2020 2029   BILIRUBINUR neg 04/26/2016 0836   KETONESUR NEGATIVE 11/05/2020 2029   PROTEINUR NEGATIVE 11/05/2020 2029   UROBILINOGEN 0.2 09/25/2016 1041   NITRITE NEGATIVE 11/05/2020 2029   LEUKOCYTESUR NEGATIVE 11/05/2020 2029    Sepsis Labs: Lactic Acid, Venous    Component Value Date/Time  LATICACIDVEN 1.3 01/21/2020 0915    MICROBIOLOGY: Recent Results (from the past 240 hour(s))  SARS CORONAVIRUS 2 (TAT 6-24 HRS) Nasopharyngeal Nasopharyngeal Swab     Status: None   Collection Time: 11/05/20  7:50 AM   Specimen: Nasopharyngeal Swab  Result Value Ref Range Status   SARS Coronavirus 2 NEGATIVE NEGATIVE Final    Comment: (NOTE) SARS-CoV-2 target nucleic acids are NOT DETECTED.  The SARS-CoV-2 RNA is generally detectable in upper and lower respiratory specimens during the acute phase of infection. Negative results do not preclude SARS-CoV-2 infection, do not rule out co-infections with other pathogens, and should not be used as the sole basis for treatment or other patient management decisions. Negative results must be combined with clinical observations, patient history, and epidemiological information. The expected result is Negative.  Fact Sheet for Patients: SugarRoll.be  Fact Sheet for Healthcare Providers: https://www.woods-mathews.com/  This test is not yet approved or cleared by the [place] FDA and  has been authorized for detection and/or diagnosis of SARS-CoV-2 by FDA under an Emergency Use Authorization (EUA). This EUA will remain  in effect (meaning this test can be used) for the  duration of the COVID-19 declaration under Se ction 564(b)(1) of the Act, 21 U.S.C. section 360bbb-3(b)(1), unless the authorization is terminated or revoked sooner.  Performed at [facility], [place] [address], [place], [place] [address]     RADIOLOGY STUDIES/RESULTS: MR CERVICAL SPINE WO CONTRAST  Result Date: 11/05/2020 CLINICAL DATA:  60 year old male with progressive pain. Recurrent falls. Bilateral lower extremity weakness. Polysubstance abuse. Diabetes. EXAM: MRI CERVICAL SPINE WITHOUT CONTRAST TECHNIQUE: Multiplanar, multisequence MR imaging of the cervical spine was performed. No intravenous contrast was administered. COMPARISON:  CT cervical spine 11/01/2020. thoracic spine MRI 10/12/2020. FINDINGS: Alignment: Improved cervical lordosis from the recent CT. No significant spondylolisthesis. Vertebrae: No marrow edema or evidence of acute osseous abnormality. Visualized bone marrow signal is within normal limits. Cord: Cervical spinal cord appears to remain normal despite some mild degenerative cervical spinal stenosis detailed below. Negative visible upper thoracic cord. Posterior Fossa, vertebral arteries, paraspinal tissues: Cervicomedullary junction is within normal limits. There is patchy T2 and STIR hyperintensity in the central pons which appears nonspecific on series 7, image 8. Otherwise negative visible posterior fossa. Preserved major vascular flow voids in the neck. Vertebral arteries appear codominant. Negative visible neck soft tissues. Disc levels: C2-C3: Minor disc bulging, endplate spurring and ligament flavum hypertrophy without stenosis. C3-C4: Small central disc protrusion with annular fissure (series 9, image 14). Otherwise mild leftward disc bulging and endplate spurring mostly affecting the foramen. Mild spinal stenosis. No significant cord mass effect. Mild to moderate left C4 foraminal stenosis. C4-C5: Circumferential disc bulge with broad-based posterior component  effacing the ventral CSF space. Up to mild spinal stenosis. No cord mass effect. No significant foraminal stenosis. C5-C6:  Mild endplate spurring.  Mild left C6 foraminal stenosis. C6-C7: Left eccentric disc bulging, broad-based posterior component. Associated left foraminal disc and endplate spurring. No spinal stenosis. Moderate left C7 foraminal stenosis. C7-T1:  Negative. T1-T2 level appears stable, negative for age. IMPRESSION: 1. Small disc herniations at C3-C4 and C4-C5 result in up to mild spinal stenosis with no significant cord mass effect. No abnormal cord signal. 2. Up to moderate degenerative neural foraminal stenosis at the left C4 and left C7 nerve levels. Mild for age cervical spine degeneration otherwise. 3. Nonspecific T2 and STIR hyperintensity in the pons. Most commonly this would be due to chronic small vessel  disease. Electronically Signed   By: Genevie Ann M.D.   On: 11/05/2020 07:34   MR Lumbar Spine W Wo Contrast  Result Date: 11/05/2020 CLINICAL DATA:  60 year old male with progressive pain. Recurrent falls. Bilateral lower extremity weakness. Polysubstance abuse. Diabetes. EXAM: MRI LUMBAR SPINE WITHOUT AND WITH CONTRAST TECHNIQUE: Multiplanar and multiecho pulse sequences of the lumbar spine were obtained without and with intravenous contrast. CONTRAST:  60mL GADAVIST GADOBUTROL 1 MMOL/ML IV SOLN COMPARISON:  Thoracic spine MRI 10/12/2020.  Lumbar MRI 08/17/2020. FINDINGS: Segmentation: Lumbar segmentation appears to be normal and this appears concordant with the thoracic spine numbering last month, also the prior lumbar numbering. Alignment: Stable lordosis since December. Subtle degenerative retrolisthesis of L2 on L3. Vertebrae: Stable since December round vertebral body hemangioma located centrally and L3, benign. Normal background bone marrow signal. No marrow edema or acute osseous abnormality. Intact visible sacrum and SI joints. Conus medullaris and cauda equina: Conus extends to  the T12-L1 level. No lower spinal cord or conus signal abnormality. Cauda equina nerve roots appear normal. No abnormal intradural enhancement or dural thickening. Paraspinal and other soft tissues: Stable and negative visible abdominal viscera. There is subtle lumbar rect or spine I muscle edema and enhancement on the right at L2-L3 (series 11, image 5). There is more moderate sacral erector spinae muscle edema and enhancement on the left (series 11, image 11. These are new since December. No intramuscular fluid collection. Other paraspinal soft tissues appear within normal limits. Disc levels: Negative visible lower thoracic levels. L1-L2: Stable minor disc bulging and endplate spurring without stenosis. L2-L3: Mild circumferential disc bulge. Small right paracentral disc protrusion and annular fissure appear mildly increased since December. But there is no significant spinal stenosis, mild if any associated right lateral recess stenosis (right L3 nerve level). L3-L4:  Stable mild disc bulge without stenosis. L4-L5:  Negative. L5-S1: Moderate left facet hypertrophy and degenerative facet joint fluid appear stable since December. No associated stenosis. IMPRESSION: 1. Mild nonspecific inflammation in the right erector spinae muscle at L2-L3, and on the left at S1-S2. These might be posttraumatic in this setting. 2. No other acute or inflammatory process identified in the lumbar spine. 3. Mild lumbar spine degeneration without spinal stenosis. Rightward L2-L3 disc protrusion with annular fissure does appear mildly increased since December. Query right L3 radiculitis. Moderately advanced chronic facet degeneration on the left at L5-S1. Electronically Signed   By: Genevie Ann M.D.   On: 11/05/2020 07:41     LOS: 0 days   Oren Binet, MD  [facility]    To contact the attending provider between 7A-7P or the covering provider during after hours 7P-7A, please log into the web site [email or web address] and  access using universal [place] password for that web site. If you do not have the password, please call the hospital operator.  11/06/2020, 10:37 AM

## 2020-11-06 NOTE — Progress Notes (Signed)
Inpatient Diabetes Program Recommendations  AACE/ADA: New Consensus Statement on Inpatient Glycemic Control (2015)  Target Ranges:  Prepandial:   less than 140 mg/dL      Peak postprandial:   less than 180 mg/dL (1-2 hours)      Critically ill patients:  140 - 180 mg/dL   Lab Results  Component Value Date   GLUCAP 258 (H) 11/06/2020   HGBA1C 10.6 (H) 11/05/2020    Review of Glycemic Control Results for Tyler Brewer, Tyler Brewer (MRN [id number]) as of 11/06/2020 09:56  Ref. Range 11/05/2020 07:29 11/05/2020 11:30 11/05/2020 17:07 11/06/2020 08:58  Glucose-Capillary Latest Ref Range: 70 - 99 mg/dL 250 (H) 264 (H) 193 (H) 258 (H)   Diabetes history: DM Outpatient Diabetes medications: Tresiba 40 units QHS, Novolog 10 units TID Current orders for Inpatient glycemic control: Lantus 40 units QAM, Novolog 0-15 units TID, Novolog 6 units TID with meals  Inpatient Diabetes Program Recommendations:     Please consider Novolog 0-5 units QHS Lantus 45 unit QAM (could give additional 5 units now)  Will continue to follow while inpatient.  Thank you, Reche Dixon, RN, BSN Diabetes Coordinator Inpatient Diabetes Program [phone] (team pager from 8a-5p)

## 2020-11-07 ENCOUNTER — Ambulatory Visit: Payer: Medicare HMO | Admitting: Neurology

## 2020-11-07 DIAGNOSIS — E1140 Type 2 diabetes mellitus with diabetic neuropathy, unspecified: Secondary | ICD-10-CM | POA: Diagnosis not present

## 2020-11-07 DIAGNOSIS — E1165 Type 2 diabetes mellitus with hyperglycemia: Secondary | ICD-10-CM | POA: Diagnosis not present

## 2020-11-07 DIAGNOSIS — I10 Essential (primary) hypertension: Secondary | ICD-10-CM | POA: Diagnosis not present

## 2020-11-07 LAB — GLUCOSE, CAPILLARY
Glucose-Capillary: 161 mg/dL — ABNORMAL HIGH (ref 70–99)
Glucose-Capillary: 218 mg/dL — ABNORMAL HIGH (ref 70–99)
Glucose-Capillary: 239 mg/dL — ABNORMAL HIGH (ref 70–99)
Glucose-Capillary: 199 mg/dL — ABNORMAL HIGH (ref 70–99)

## 2020-11-07 LAB — HSV DNA BY PCR (REFERENCE LAB)
HSV 1 DNA: NEGATIVE
HSV 2 DNA: NEGATIVE

## 2020-11-07 MED ORDER — CLONAZEPAM 0.5 MG PO TABS
0.5000 mg | ORAL_TABLET | Freq: Two times a day (BID) | ORAL | Status: DC | PRN
  Administered 2020-11-07: 0.5 mg via ORAL
  Filled 2020-11-07: qty 1

## 2020-11-07 MED ORDER — ENSURE MAX PROTEIN PO LIQD
11.0000 [oz_av] | Freq: Two times a day (BID) | ORAL | Status: DC
  Administered 2020-11-07 – 2020-11-19 (×18): 11 [oz_av] via ORAL
  Filled 2020-11-07 (×29): qty 330

## 2020-11-07 MED ORDER — ADULT MULTIVITAMIN W/MINERALS CH
1.0000 | ORAL_TABLET | Freq: Every day | ORAL | Status: DC
  Administered 2020-11-07 – 2020-11-21 (×15): 1 via ORAL
  Filled 2020-11-07 (×15): qty 1

## 2020-11-07 NOTE — Progress Notes (Addendum)
Inpatient Diabetes Program Recommendations  AACE/ADA: New Consensus Statement on Inpatient Glycemic Control (2015)  Target Ranges:  Prepandial:   less than 140 mg/dL      Peak postprandial:   less than 180 mg/dL (1-2 hours)      Critically ill patients:  140 - 180 mg/dL   Lab Results  Component Value Date   GLUCAP 218 (H) 11/07/2020   HGBA1C 10.6 (H) 11/05/2020    Review of Glycemic Control  Diabetes history: DM 2 Outpatient Diabetes medications: Tresiba 40 units, Novolog 10 units tid Current orders for Inpatient glycemic control:  Lantus 44 units Novolog 0-15 units tid  Novolog 8 units tid meal coverage  Spoke with pt at bedside regarding A1c level and glucose control at home. Pt reports his A1c has never been below a 10%. He also reports frequently being in contact with his PCP. He has gotten [place] samples when the office has them. He has been without insulin at one time. Pt reports wanting to get an insulin that is better covered by insurance if possible. Pt reports Tresiba and Novolog each are around $47 each. Pt reports missing doses of insulin due to him sleeping in at home. Pt reports ever since he stopped using meth, he sleeps a lot. Discussed with pt that if he does miss doses of insulin, Tyler Aas would be better due to the 42 hours half life if he misses a dose. Pt also reports a barrier to care is transportation issues. He plans to see an Endocrinologist who is also in the same practice as his PCP. His mom occasionally helps buy his medications for him.   Placed benefits check on Levemir insulin as the chart shows it is preferred by pt's insurance.  Thanks,  Tama Headings RN, MSN, BC-ADM Inpatient Diabetes Coordinator Team Pager [phone] (8a-5p)
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
## 2020-11-07 NOTE — Progress Notes (Signed)
PROGRESS NOTE        PATIENT DETAILS Name: Tyler Brewer Age: 60 y.o. Sex: male Date of Birth: 1961-07-25 Admit Date: 11/04/2020 Admitting Physician Etta Quill, DO WUJ:[id number] Koren Shiver, DO  Brief Narrative: Patient is a 60 y.o. male with history of DM-2, CAD, OSA-presenting to the hospital with frequent falls-patient has had progressive bilateral lower extremity weakness since August 2021.  Significant events: 3/18>> admit for lower extremity weakness  Significant studies: 2/23>> MRI thoracic spine: No acute abnormalities noted. 3/19>> MRI lumbar sacral spine: No acute process identified. 3/19>> MRI C-spine: Small disc herniation at C3-C4, C4-C5-no abnormal cord signal. 3/19>> vitamin B12: 538 3/19>> HIV: Nonreactive 3/19>> TSH: Normal limits 3/20>> MRI T-spine with contrast: Normal-appearing thoracic cord 3/20>> MRI C-spine: Normal-appearing cervical cord-no pathologic enhancement.  Antimicrobial therapy: None  Microbiology data: None  Procedures : None  Consults: Neurology  DVT Prophylaxis : Prophylactic Lovenox   Subjective: Lying comfortably in bed-no major issues overnight.  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  Neurology planning on LP.  Evaluated by PT-recommendations are for CIR.  Peripheral diabetic nephropathy: Continue Neurontin/Mobic and Cymbalta.  DM-2 (A1c 10.6 on 3/19): CBGs stable-continue Lantus 44 units daily, 8 units of NovoLog with meals and SSI.  Follow and adjust.  Recent Labs    11/06/20 2027 11/07/20 0726 11/07/20 1112  GLUCAP 230* 161* 218*   HTN: BP relatively well controlled-continue losartan  HLD: Continue statin  BPH: Continue Flomax  Hyponatremia: Appears to be mild-and chronic-continue to  follow periodically.  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  Obesity: Estimated body mass index is 38.35 kg/m as calculated from the following:   Height as of 10/31/20: 5\' 11"  (1.803 m).   Weight as of this encounter: 124.7 kg.    Diet: Diet Order            Diet Carb Modified Fluid consistency: Thin; Room service appropriate? Yes  Diet effective now                  Code Status: Full code   Family Communication: None at bedside-we will reach out to family over the next few days.  Disposition Plan: Status is: Observation  The patient will require care spanning > 2 midnights and should be moved to inpatient because: Inpatient level of care appropriate due to severity of illness  Dispo: The patient is from: Home              Anticipated d/c is to: To be determined              Patient currently is not medically stable to d/c.   Difficult to place patient No    Barriers to Discharge: Severe bilateral lower extremities weakness-unable to ambulate-work-up in process.  Needs inpatient work-up given severity of symptoms.  Antimicrobial agents: Anti-infectives (From admission, onward)   None       Time spent: 25- minutes-Greater than 50% of this time was spent in counseling, explanation of diagnosis, planning of further management, and coordination of care.  MEDICATIONS: Scheduled Meds: . DULoxetine  30 mg Oral Once per day on Mon Wed Fri  . enoxaparin (LOVENOX) injection  60 mg Subcutaneous Q24H  . gabapentin  300 mg Oral 2 times per day  . gabapentin  600 mg Oral Daily  . insulin aspart  0-15 Units Subcutaneous TID WC  . insulin aspart  8 Units Subcutaneous TID WC  . insulin glargine  44 Units Subcutaneous Daily  . losartan  100 mg Oral Daily  . nystatin   Topical BID  . pravastatin  40 mg Oral q1800  . tamsulosin  0.4 mg Oral Daily   Continuous Infusions: PRN Meds:.acetaminophen **OR** acetaminophen, diclofenac Sodium, meloxicam,  methocarbamol, ondansetron **OR** ondansetron (ZOFRAN) IV, polyethylene glycol, traMADol   PHYSICAL EXAM: Vital signs: Vitals:   11/06/20 1659 11/06/20 2012 11/07/20 0521 11/07/20 1057  BP: (!) 140/91 (!) 156/85 (!) 178/105 (!) 191/115  Pulse: 86 86 81 83  Resp: 18 18 16 18   Temp: 98.1 F (36.7 C) 97.8 F (36.6 C) 97.9 F (36.6 C) 97.8 F (36.6 C)  TempSrc: Oral  Oral Oral  SpO2: 99% 98% 92% 97%  Weight:       Filed Weights   11/04/20 1409  Weight: 124.7 kg   Body mass index is 38.35 kg/m.  Gen Exam:Alert awake-not in any distress HEENT:atraumatic, normocephalic Chest: B/L clear to auscultation anteriorly CVS:S1S2 regular Abdomen:soft non tender, non distended Extremities:no edema Neurology: Left leg-unable to lift against resistance, right leg, able to lift against mild resistance. Skin: no rash I have personally reviewed following labs and imaging studies  LABORATORY DATA: CBC: Recent Labs  Lab 10/31/20 1619 11/04/20 1411  WBC 9.2 6.9  NEUTROABS 6.5  --   HGB 16.0 14.4  HCT 45.8 40.9  MCV 85.6 86.7  PLT 305 193    Basic Metabolic Panel: Recent Labs  Lab 10/31/20 1619 11/04/20 1411 11/05/20 0718  NA 128* 130*  --   K 4.2 3.9  --   CL 95* 99  --   CO2 23 20*  --   GLUCOSE 461* 439*  --   BUN 24* 23*  --   CREATININE 0.98 1.17  --   CALCIUM 9.1 8.8*  --   MG  --   --  1.9    GFR: Estimated Creatinine Clearance: 93.7 mL/min (by C-G formula based on SCr of 1.17 mg/dL).  Liver Function Tests: Recent Labs  Lab 10/31/20 1619  AST 16  ALT 26  ALKPHOS 95  BILITOT 0.6  PROT 7.2  ALBUMIN 3.8   No results for input(s): LIPASE, AMYLASE in the last 168 hours. No results for input(s): AMMONIA in the last 168 hours.  Coagulation Profile: No results for input(s): INR, PROTIME in the last 168 hours.  Cardiac Enzymes: Recent Labs  Lab 10/31/20 1634  CKTOTAL 31*    BNP (last 3 results) No results for input(s): PROBNP in the last 8760  hours.  Lipid Profile: No results for input(s): CHOL, HDL, LDLCALC, TRIG, CHOLHDL, LDLDIRECT in the last 72 hours.  Thyroid Function Tests: Recent Labs    11/05/20 0718  TSH 2.948    Anemia Panel: Recent Labs    11/05/20 0718  VITAMINB12 538    Urine analysis:    Component Value Date/Time   COLORURINE YELLOW 11/05/2020 2029   APPEARANCEUR HAZY (A) 11/05/2020 2029   LABSPEC 1.028 11/05/2020 2029   PHURINE 5.0 11/05/2020 2029   GLUCOSEU >=500 (A) 11/05/2020 2029   GLUCOSEU 100 (A) 09/25/2016 1041   HGBUR NEGATIVE 11/05/2020 2029   BILIRUBINUR NEGATIVE 11/05/2020 2029   BILIRUBINUR neg 04/26/2016 0836   KETONESUR NEGATIVE 11/05/2020 2029   PROTEINUR NEGATIVE 11/05/2020  2029   UROBILINOGEN 0.2 09/25/2016 1041   NITRITE NEGATIVE 11/05/2020 2029   LEUKOCYTESUR NEGATIVE 11/05/2020 2029    Sepsis Labs: Lactic Acid, Venous    Component Value Date/Time   LATICACIDVEN 1.3 01/21/2020 0915    MICROBIOLOGY: Recent Results (from the past 240 hour(s))  SARS CORONAVIRUS 2 (TAT 6-24 HRS) Nasopharyngeal Nasopharyngeal Swab     Status: None   Collection Time: 11/05/20  7:50 AM   Specimen: Nasopharyngeal Swab  Result Value Ref Range Status   SARS Coronavirus 2 NEGATIVE NEGATIVE Final    Comment: (NOTE) SARS-CoV-2 target nucleic acids are NOT DETECTED.  The SARS-CoV-2 RNA is generally detectable in upper and lower respiratory specimens during the acute phase of infection. Negative results do not preclude SARS-CoV-2 infection, do not rule out co-infections with other pathogens, and should not be used as the sole basis for treatment or other patient management decisions. Negative results must be combined with clinical observations, patient history, and epidemiological information. The expected result is Negative.  Fact Sheet for Patients: SugarRoll.be  Fact Sheet for Healthcare Providers: https://www.woods-mathews.com/  This test  is not yet approved or cleared by the [place] FDA and  has been authorized for detection and/or diagnosis of SARS-CoV-2 by FDA under an Emergency Use Authorization (EUA). This EUA will remain  in effect (meaning this test can be used) for the duration of the COVID-19 declaration under Se ction 564(b)(1) of the Act, 21 U.S.C. section 360bbb-3(b)(1), unless the authorization is terminated or revoked sooner.  Performed at [facility], [place] [address], [place], [place] [address]     RADIOLOGY STUDIES/RESULTS: MR CERVICAL SPINE W CONTRAST  Result Date: 11/06/2020 CLINICAL DATA:  Recurrent falls due to progressive bilateral lower extremity weakness, most recently 11/01/2020 and 10/31/2020. EXAM: MRI CERVICAL SPINE WITH CONTRAST TECHNIQUE: Multiplanar, multisequence MR imaging of the cervical spine was performed following the administration of intravenous contrast. CONTRAST:  10 mL Gadavist IV. COMPARISON:  MRI cervical spine 11/05/2020. FINDINGS: No new abnormality is seen compared to the prior examination. There is no pathologic enhancement of the spinal cord or any other structure after contrast administration. IMPRESSION: Normal appearing cervical cord. Negative for pathologic enhancement. Electronically Signed   By: Inge Rise M.D.   On: 11/06/2020 16:39   MR THORACIC SPINE W WO CONTRAST  Result Date: 11/06/2020 CLINICAL DATA:  Progressive bilateral lower extremity weakness resulting in falls 11/01/2020 and 10/31/2020. EXAM: MRI THORACIC WITHOUT AND WITH CONTRAST TECHNIQUE: Multiplanar and multiecho pulse sequences of the thoracic spine were obtained without and with intravenous contrast. CONTRAST:  10 mL GADAVIST IV SOLN COMPARISON:  MRI thoracic spine 10/12/2020. FINDINGS: Alignment:  Normal. Vertebrae: No fracture, worrisome lesion or evidence of discitis. A few small Schmorl's nodes are again seen. Small hemangiomas in T9 and T10 are unchanged. Cord: Normal signal throughout.  No pathologic enhancement after contrast administration. Paraspinal and other soft tissues: Negative. Disc levels: No change in mild thoracic spondylosis most notable at T4-5 where there is a very shallow right paracentral protrusion without stenosis. IMPRESSION: Normal appearing thoracic cord. No finding to explain the patient's symptoms. No change in small right paracentral protrusion at T4-5 without stenosis. Electronically Signed   By: Inge Rise M.D.   On: 11/06/2020 16:44     LOS: 1 day   Oren Binet, MD  [facility]    To contact the attending provider between 7A-7P or the covering provider during after hours 7P-7A, please log into the web site [email or web address] and  access using universal  password for that web site. If you do not have the password, please call the hospital operator.  11/07/2020, 11:28 AM

## 2020-11-07 NOTE — Telephone Encounter (Signed)
Pt currently admitted.

## 2020-11-08 ENCOUNTER — Telehealth: Payer: Self-pay

## 2020-11-08 ENCOUNTER — Encounter: Payer: Self-pay | Admitting: Neurology

## 2020-11-08 ENCOUNTER — Other Ambulatory Visit: Payer: Medicare HMO

## 2020-11-08 DIAGNOSIS — I10 Essential (primary) hypertension: Secondary | ICD-10-CM | POA: Diagnosis not present

## 2020-11-08 DIAGNOSIS — R29898 Other symptoms and signs involving the musculoskeletal system: Secondary | ICD-10-CM | POA: Diagnosis not present

## 2020-11-08 DIAGNOSIS — E1165 Type 2 diabetes mellitus with hyperglycemia: Secondary | ICD-10-CM | POA: Diagnosis not present

## 2020-11-08 DIAGNOSIS — E1140 Type 2 diabetes mellitus with diabetic neuropathy, unspecified: Secondary | ICD-10-CM | POA: Diagnosis not present

## 2020-11-08 LAB — BASIC METABOLIC PANEL
Anion gap: 6 (ref 5–15)
BUN: 25 mg/dL — ABNORMAL HIGH (ref 6–20)
CO2: 24 mmol/L (ref 22–32)
Calcium: 8.8 mg/dL — ABNORMAL LOW (ref 8.9–10.3)
Chloride: 104 mmol/L (ref 98–111)
Creatinine, Ser: 0.97 mg/dL (ref 0.61–1.24)
GFR, Estimated: >60 mL/min (ref >60)
Glucose, Bld: 174 mg/dL — ABNORMAL HIGH (ref 70–99)
Potassium: 4.2 mmol/L (ref 3.5–5.1)
Sodium: 134 mmol/L — ABNORMAL LOW (ref 135–145)

## 2020-11-08 LAB — MAGNESIUM: Magnesium: 1.8 mg/dL (ref 1.7–2.4)

## 2020-11-08 LAB — GLUCOSE, CAPILLARY
Glucose-Capillary: 142 mg/dL — ABNORMAL HIGH (ref 70–99)
Glucose-Capillary: 155 mg/dL — ABNORMAL HIGH (ref 70–99)
Glucose-Capillary: 152 mg/dL — ABNORMAL HIGH (ref 70–99)
Glucose-Capillary: 164 mg/dL — ABNORMAL HIGH (ref 70–99)

## 2020-11-08 LAB — CMV DNA BY PCR, QUALITATIVE: CMV DNA, Qual PCR: NEGATIVE

## 2020-11-08 MED ORDER — AMLODIPINE BESYLATE 5 MG PO TABS
5.0000 mg | ORAL_TABLET | Freq: Every day | ORAL | Status: DC
  Administered 2020-11-08 – 2020-11-20 (×14): 5 mg via ORAL
  Filled 2020-11-08 (×14): qty 1

## 2020-11-08 MED ORDER — DIAZEPAM 5 MG PO TABS
10.0000 mg | ORAL_TABLET | Freq: Once | ORAL | Status: AC
  Administered 2020-11-09: 10 mg via ORAL
  Filled 2020-11-08: qty 2

## 2020-11-08 MED ORDER — ENOXAPARIN SODIUM 60 MG/0.6ML ~~LOC~~ SOLN: 60.0000 mg | SUBCUTANEOUS | Status: DC

## 2020-11-08 MED ORDER — LORAZEPAM 0.5 MG PO TABS
0.5000 mg | ORAL_TABLET | Freq: Three times a day (TID) | ORAL | Status: DC | PRN
  Administered 2020-11-10 – 2020-11-18 (×5): 0.5 mg via ORAL
  Filled 2020-11-08 (×5): qty 1

## 2020-11-08 NOTE — Progress Notes (Signed)
Inpatient Diabetes Program Recommendations  AACE/ADA: New Consensus Statement on Inpatient Glycemic Control (2015)  Target Ranges:  Prepandial:   less than 140 mg/dL      Peak postprandial:   less than 180 mg/dL (1-2 hours)      Critically ill patients:  140 - 180 mg/dL   Lab Results  Component Value Date   GLUCAP 142 (H) 11/08/2020   HGBA1C 10.6 (H) 11/05/2020    Review of Glycemic Control Results for Tyler Brewer, Tyler Brewer (MRN [id number]) as of 11/08/2020 10:40  Ref. Range 11/07/2020 07:26 11/07/2020 11:12 11/07/2020 17:51 11/07/2020 20:33 11/08/2020 06:35  Glucose-Capillary Latest Ref Range: 70 - 99 mg/dL 161 (H) 218 (H) 239 (H) 199 (H) 142 (H)   Diabetes history: DM 2 Outpatient Diabetes medications: Tresiba 40 units, Novolog 10 units tid Current orders for Inpatient glycemic control:  Lantus 44 units Novolog 0-15 units tid  Novolog 8 units tid meal coverage  -  Consider increasing Novolog meal coverage to 20 units tid if eating >50% of meals.  See note from 3/22, spoke with pt. Benefits check on [place] to see about price compared to [name] for affordability. However pt misses dose due to sleeping all the time, Tyler Aas maybe a better choice due to half life of 42 hours.   D/c: Consider adding metformin as pt reports better glucose control while on this in addition to insulin.   Thanks,  Tama Headings RN, MSN, BC-ADM Inpatient Diabetes Coordinator Team Pager [phone] (8a-5p)

## 2020-11-08 NOTE — Telephone Encounter (Signed)
Patient is requesting a letter to be sent to his [employer]. Patient has provided contact information of where the letter needs to be sent.   [employer] QTT:[phone] Attn: Cherly Anderson   Patient has requested for me to send him a MyChart message once letter is completed and faxed.
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
## 2020-11-08 NOTE — Telephone Encounter (Signed)
Letter ready.
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
## 2020-11-08 NOTE — Progress Notes (Signed)
Orthopedic Tech Progress Note Patient Details:  Tyler Brewer Dec 20, 1960 [id number] Called in order to [facility] for an AFO CONSULT  Patient ID: [name], male   DOB: 1961/03/17, 60 y.o.   MRN: [id number]   Janit Pagan 11/08/2020, 4:47 PM

## 2020-11-08 NOTE — Progress Notes (Signed)
Neurology Progress Note Subjective: No acute overnight events.  MRI total spine imaging unrevealing for bilateral lower extremity weakness etiology.  Patient consented for LP today for further evaluation.   Exam: Vitals:   11/07/20 2032 11/08/20 0517  BP: (!) 172/85 (!) 163/84  Pulse: 82 80  Resp: 16 18  Temp: 97.7 F (36.5 C) 98.1 F (36.7 C)  SpO2: 99% 97%   Gen: Laying in bed, awake, watching television, in no acute distress Resp: non-labored breathing, regular respiratory rate Abd: soft, non-distended, non-tender Extremities: Left foot with 2+ nonpitting edema, right foot with 1+ edema  Neuro:  Mental Status: Awake, alert, and oriented to person, place, age, month, and situation. He is able to give a clear and coherent history of present illness. Speech is intact without aphasia or dysarthria. Comprehension, naming, and fluency are intact. Patient is able to follow commands without difficulty. No neglect noted.  Cranial Nerves: II: Pupils 4 mm, equal, round, and reactive to light bilaterally III, IV, VI: EOMs intact without ptosis. V: Facial sensation intact and symmetric to light touch VII: Face is symmetric resting and smiling VIII: Hearing is intact to voice IX, X: Phonation intact, symmetric palate rise with phonation XI: Shoulder shrug symmetric XII: Tongue protrudes midline without fasciculations or atrophy. Motor:  4+/5 strength in RUE and 4-/5 strength in LUE; has spontaneous antigravity movement without pronator drift. Bilateral lower extremities with weakness, left weaker than right lower extremity. Left lower extremity 3/5 from hip to ankles with some attempt at antigravity movement but with immediate drift to bed. Ankle dorsiflexion and plantar flexion 2/5 on the left. Right lower extremity with 4/5 strength at the hip to the ankle with some drift and limited elevation on examination.   Sensory: Intact and symmetric to light touch in bilateral upper extremities. Left  lower extremity with decreased sensation to light touch - diabetic neuropathy at baseline. Bilateral lower extremities with decreased sensation to cool temperature distally, improved sensation to cool temperature proximally beginning at the level of the knee. Paresthesias reported in bilateral feet and in the left shin area. Stocking distribution neuropathy is noted to be asymmetric DTR: 1+ and symmetric in bilateral biceps and brachioradialis, 2+ right patella, 1+ left patella. Cerebellar: FNF intact bilaterally, unable to assess HKS due to lower extremity weakness.  Gait: Deferred  Imaging reviewed: MRI L-spine 11/05/20 1. Mild nonspecific inflammation in the right erector spinae muscle at L2-L3, and on the left at S1-S2. These might be posttraumatic in this setting. 2. No other acute or inflammatory process identified in the lumbar spine. 3. Mild lumbar spine degeneration without spinal stenosis. Rightward L2-L3 disc protrusion with annular fissure does appear mildly increased since December. Query right L3 radiculitis. Moderately advanced chronic facet degeneration on the left at L5-S1.  MRI C-spine wo contrast 11/05/20 1. Small disc herniations at C3-C4 and C4-C5 result in up to mild spinal stenosis with no significant cord mass effect. No abnormal cord signal. 2. Up to moderate degenerative neural foraminal stenosis at the left C4 and left C7 nerve levels. Mild for age cervical spine degeneration otherwise. 3. Nonspecific T2 and STIR hyperintensity in the pons. Most commonly this would be due to chronic small vessel disease.  Prior to admission: MRI T-spine wo contrast 11/05/20 Mildly motion degraded exam.  Mild thoracic spondylosis as described. Most notably, a small T4-T5 right center disc protrusion mildly narrows the spinal canal, contacting and mildly flattening the ventral spinal cord. No significant disc herniation or spinal canal stenosis  at the remaining levels. No significant  foraminal stenosis within the thoracic spine.  Studies to date do not identify etiology of his presenting sx.  Labs:  TSH 2.948 B12 538 HIV negative CMV DNA PCR negative  CBC    Component Value Date/Time   WBC 6.9 11/04/2020 1411   RBC 4.72 11/04/2020 1411   HGB 14.4 11/04/2020 1411   HCT 40.9 11/04/2020 1411   PLT 294 11/04/2020 1411   MCV 86.7 11/04/2020 1411   MCH 30.5 11/04/2020 1411   MCHC 35.2 11/04/2020 1411   RDW 12.2 11/04/2020 1411   LYMPHSABS 1.5 10/31/2020 1619   MONOABS 0.7 10/31/2020 1619   EOSABS 0.3 10/31/2020 1619   BASOSABS 0.1 10/31/2020 1619  CMP     Component Value Date/Time   NA 134 (L) 11/08/2020 0400   K 4.2 11/08/2020 0400   CL 104 11/08/2020 0400   CO2 24 11/08/2020 0400   GLUCOSE 174 (H) 11/08/2020 0400   BUN 25 (H) 11/08/2020 0400   CREATININE 0.97 11/08/2020 0400   CREATININE 0.90 09/28/2016 1651   CALCIUM 8.8 (L) 11/08/2020 0400   PROT 7.2 10/31/2020 1619   ALBUMIN 3.8 10/31/2020 1619   AST 16 10/31/2020 1619   ALT 26 10/31/2020 1619   ALKPHOS 95 10/31/2020 1619   BILITOT 0.6 10/31/2020 1619   GFRNONAA >60 11/08/2020 0400   GFRNONAA >89 09/28/2016 1651   GFRAA >60 01/21/2020 0915   GFRAA >89 09/28/2016 1651   Lab Results  Component Value Date   CHOL 158 07/05/2020   HDL 50.00 07/05/2020   LDLCALC 83 07/05/2020   LDLDIRECT 124.0 05/02/2017   TRIG 123.0 07/05/2020   CHOLHDL 3 07/05/2020   Lab Results  Component Value Date   HGBA1C 10.6 (H) 11/05/2020   Urinalysis    Component Value Date/Time   COLORURINE YELLOW 11/05/2020 2029   APPEARANCEUR HAZY (A) 11/05/2020 2029   LABSPEC 1.028 11/05/2020 2029   PHURINE 5.0 11/05/2020 2029   GLUCOSEU >=500 (A) 11/05/2020 2029   GLUCOSEU 100 (A) 09/25/2016 1041   HGBUR NEGATIVE 11/05/2020 2029   BILIRUBINUR NEGATIVE 11/05/2020 2029   BILIRUBINUR neg 04/26/2016 0836   KETONESUR NEGATIVE 11/05/2020 2029   PROTEINUR NEGATIVE 11/05/2020 2029   UROBILINOGEN 0.2 09/25/2016 1041    NITRITE NEGATIVE 11/05/2020 2029   LEUKOCYTESUR NEGATIVE 11/05/2020 2029   Assessment: 60 y.o. male with PMH significant for CAD, depression, poorly controlled DM-2, narcotic addiction, HLD, HTN, obesity, neuropathy, OSA who presented to ED with recurrent falls due to progressive BLE weakness.  - His neurologic examination is notable for left greater than right BLE weakness with sensory neuropathy. He also has some asymmetric upper extremity weakness. 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 Upper extremities are also weak which raises the question of a possible multifocal motor neuropathy or atypical presentation of CIDP.  - Work up thus far for possible causes of amyotrophy has been unrevealing; HIV negative, CMV PCR negative; pending Arbovirus/West Nile panel results. - Patient with history of diabetic neuropathy with continued uncontrolled diabetes mellitus: A1c of 10.6%, urine glucose >500, persistently elevated blood glucose. Likely contributes to falls, decreased conditioning, and pain in lower extremities.   -DDx includes CIDP (making this less likely is preservation of some of his deep tendon reflexes, which are however noted to be hypoactive), statin-induced myopathy (his CK is low, but statin myopathies can present without CK elevation) and The highest component of the  DDx is diabetic polyradiculopathy (aka diabetic amyotrophy) due to patient having several typical features including asymmetric presentation, the patient's age, thigh and hip pain, muscle atrophy, difficulty standing and poorly controlled diabetes with high HgbA1c.  Recommendations: - Plan for LP w/ CSF cell count and differential x 2, protein and glucose, IgG index, oligoclonal bands, VDRL and cytology. Due to large body habitus, patient would benefit from fluoro guided LP. - Patient would benefit from EMG procedure outpatient.  Previously unable to tolerate testing, would recommend re-attempt for highest yield     Anibal Henderson, AGACNP-BC [facility] [phone]  Electronically signed: Dr. Kerney Elbe

## 2020-11-08 NOTE — Progress Notes (Signed)
PROGRESS NOTE        PATIENT DETAILS Name: Tyler Brewer Age: 60 y.o. Sex: male Date of Birth: Sep 02, 1960 Admit Date: 11/04/2020 Admitting Physician Etta Quill, DO XIH:WTUUE Koren Shiver, DO  Brief Narrative: Patient is a 60 y.o. male with history of DM-2, CAD, OSA-presenting to the hospital with frequent falls-patient has had progressive bilateral lower extremity weakness since August 2021.  Significant events: 3/18>> admit for lower extremity weakness  Significant studies: 2/23>> MRI thoracic spine: No acute abnormalities noted. 3/19>> MRI lumbar sacral spine: No acute process identified. 3/19>> MRI C-spine: Small disc herniation at C3-C4, C4-C5-no abnormal cord signal. 3/19>> vitamin B12: 538 3/19>> HIV: Nonreactive 3/19>> TSH: Normal limits 3/20>> MRI T-spine with contrast: Normal-appearing thoracic cord 3/20>> MRI C-spine: Normal-appearing cervical cord-no pathologic enhancement.  Antimicrobial therapy: None  Microbiology data: None  Procedures : None  Consults: Neurology  DVT Prophylaxis : Prophylactic Lovenox   Subjective: No major issues overnight-has unchanged lower extremity weakness.  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  Neurology planning on LP.  Evaluated by PT-recommendations are for CIR. await further recommendations from neurology.  Peripheral diabetic nephropathy: Continue Neurontin/Mobic and Cymbalta.  DM-2 (A1c 10.6 on 3/19): CBGs stable-continue Lantus 44 units daily, 8 units of NovoLog with meals and SSI.  Follow and adjust.  Recent Labs    11/07/20 2033 11/08/20 0635 11/08/20 1159  GLUCAP 199* 142* 155*   HTN: BP on the higher side-continue losartan-add amlodipine and follow.  HLD: Continue  statin  BPH: Continue Flomax  Hyponatremia: Appears to be mild-and chronic-continue to follow periodically.  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  Obesity: Estimated body mass index is 38.35 kg/m as calculated from the following:   Height as of this encounter: 5\' 11"  (1.803 m).   Weight as of this encounter: 124.7 kg.    Diet: Diet Order            Diet Carb Modified Fluid consistency: Thin; Room service appropriate? Yes  Diet effective now                  Code Status: Full code   Family Communication: None at bedside-we will reach out to family over the next few days.  Disposition Plan: Status is: Observation  The patient will require care spanning > 2 midnights and should be moved to inpatient because: Inpatient level of care appropriate due to severity of illness  Dispo: The patient is from: Home              Anticipated d/c is to: To be determined              Patient currently is not medically stable to d/c.   Difficult to place patient No    Barriers to Discharge: Severe bilateral lower extremities weakness-unable to ambulate-work-up in process.  Needs inpatient work-up given severity of symptoms.  Antimicrobial agents: Anti-infectives (From admission, onward)   None       Time spent: 25- minutes-Greater than 50% of this time was spent in counseling, explanation of diagnosis, planning of further management, and coordination of care.  MEDICATIONS: Scheduled Meds: . diazepam  10 mg Oral Once  . DULoxetine  30 mg Oral Once  per day on Mon Wed Fri  . [START ON 11/09/2020] enoxaparin (LOVENOX) injection  60 mg Subcutaneous Q24H  . gabapentin  300 mg Oral 2 times per day  . gabapentin  600 mg Oral Daily  . insulin aspart  0-15 Units Subcutaneous TID WC  . insulin aspart  8 Units Subcutaneous TID WC  . insulin glargine  44 Units Subcutaneous Daily  . losartan  100 mg Oral Daily  . multivitamin with minerals  1 tablet Oral Daily  .  nystatin   Topical BID  . pravastatin  40 mg Oral q1800  . Ensure Max Protein  11 oz Oral BID  . tamsulosin  0.4 mg Oral Daily   Continuous Infusions: PRN Meds:.acetaminophen **OR** acetaminophen, clonazePAM, diclofenac Sodium, meloxicam, methocarbamol, ondansetron **OR** ondansetron (ZOFRAN) IV, polyethylene glycol, traMADol   PHYSICAL EXAM: Vital signs: Vitals:   11/07/20 2032 11/08/20 0410 11/08/20 0517 11/08/20 1048  BP: (!) 172/85  (!) 163/84 (!) 187/99  Pulse: 82  80 86  Resp: 16  18 18   Temp: 97.7 F (36.5 C)  98.1 F (36.7 C) 98.2 F (36.8 C)  TempSrc:      SpO2: 99%  97% 97%  Weight:      Height:  5\' 11"  (1.803 m)     Filed Weights   11/04/20 1409  Weight: 124.7 kg   Body mass index is 38.35 kg/m.  Gen Exam:Alert awake-not in any distress HEENT:atraumatic, normocephalic Chest: B/L clear to auscultation anteriorly CVS:S1S2 regular Abdomen:soft non tender, non distended Extremities:no edema Neurology: Left leg-unable to lift against mild resistance, right leg, able to lift against mild resistance Skin: no rash I have personally reviewed following labs and imaging studies  LABORATORY DATA: CBC: Recent Labs  Lab 11/04/20 1411  WBC 6.9  HGB 14.4  HCT 40.9  MCV 86.7  PLT 102    Basic Metabolic Panel: Recent Labs  Lab 11/04/20 1411 11/05/20 0718 11/08/20 0400  NA 130*  --  134*  K 3.9  --  4.2  CL 99  --  104  CO2 20*  --  24  GLUCOSE 439*  --  174*  BUN 23*  --  25*  CREATININE 1.17  --  0.97  CALCIUM 8.8*  --  8.8*  MG  --  1.9 1.8    GFR: Estimated Creatinine Clearance: 113 mL/min (by C-G formula based on SCr of 0.97 mg/dL).  Liver Function Tests: No results for input(s): AST, ALT, ALKPHOS, BILITOT, PROT, ALBUMIN in the last 168 hours. No results for input(s): LIPASE, AMYLASE in the last 168 hours. No results for input(s): AMMONIA in the last 168 hours.  Coagulation Profile: No results for input(s): INR, PROTIME in the last 168  hours.  Cardiac Enzymes: No results for input(s): CKTOTAL, CKMB, CKMBINDEX, TROPONINI in the last 168 hours.  BNP (last 3 results) No results for input(s): PROBNP in the last 8760 hours.  Lipid Profile: No results for input(s): CHOL, HDL, LDLCALC, TRIG, CHOLHDL, LDLDIRECT in the last 72 hours.  Thyroid Function Tests: No results for input(s): TSH, T4TOTAL, FREET4, T3FREE, THYROIDAB in the last 72 hours.  Anemia Panel: No results for input(s): VITAMINB12, FOLATE, FERRITIN, TIBC, IRON, RETICCTPCT in the last 72 hours.  Urine analysis:    Component Value Date/Time   COLORURINE YELLOW 11/05/2020 2029   APPEARANCEUR HAZY (A) 11/05/2020 2029   LABSPEC 1.028 11/05/2020 2029   PHURINE 5.0 11/05/2020 2029   GLUCOSEU >=500 (A) 11/05/2020 2029   GLUCOSEU 100 (A)  09/25/2016 [place] 11/05/2020 2029   BILIRUBINUR NEGATIVE 11/05/2020 2029   BILIRUBINUR neg 04/26/2016 0836   KETONESUR NEGATIVE 11/05/2020 2029   PROTEINUR NEGATIVE 11/05/2020 2029   UROBILINOGEN 0.2 09/25/2016 1041   NITRITE NEGATIVE 11/05/2020 2029   LEUKOCYTESUR NEGATIVE 11/05/2020 2029    Sepsis Labs: Lactic Acid, Venous    Component Value Date/Time   LATICACIDVEN 1.3 01/21/2020 0915    MICROBIOLOGY: Recent Results (from the past 240 hour(s))  SARS CORONAVIRUS 2 (TAT 6-24 HRS) Nasopharyngeal Nasopharyngeal Swab     Status: None   Collection Time: 11/05/20  7:50 AM   Specimen: Nasopharyngeal Swab  Result Value Ref Range Status   SARS Coronavirus 2 NEGATIVE NEGATIVE Final    Comment: (NOTE) SARS-CoV-2 target nucleic acids are NOT DETECTED.  The SARS-CoV-2 RNA is generally detectable in upper and lower respiratory specimens during the acute phase of infection. Negative results do not preclude SARS-CoV-2 infection, do not rule out co-infections with other pathogens, and should not be used as the sole basis for treatment or other patient management decisions. Negative results must be combined with  clinical observations, patient history, and epidemiological information. The expected result is Negative.  Fact Sheet for Patients: SugarRoll.be  Fact Sheet for Healthcare Providers: https://www.woods-mathews.com/  This test is not yet approved or cleared by the [place] FDA and  has been authorized for detection and/or diagnosis of SARS-CoV-2 by FDA under an Emergency Use Authorization (EUA). This EUA will remain  in effect (meaning this test can be used) for the duration of the COVID-19 declaration under Se ction 564(b)(1) of the Act, 21 U.S.C. section 360bbb-3(b)(1), unless the authorization is terminated or revoked sooner.  Performed at [facility], [place] [address], [place], [place] [address]     RADIOLOGY STUDIES/RESULTS: MR CERVICAL SPINE W CONTRAST  Result Date: 11/06/2020 CLINICAL DATA:  Recurrent falls due to progressive bilateral lower extremity weakness, most recently 11/01/2020 and 10/31/2020. EXAM: MRI CERVICAL SPINE WITH CONTRAST TECHNIQUE: Multiplanar, multisequence MR imaging of the cervical spine was performed following the administration of intravenous contrast. CONTRAST:  10 mL Gadavist IV. COMPARISON:  MRI cervical spine 11/05/2020. FINDINGS: No new abnormality is seen compared to the prior examination. There is no pathologic enhancement of the spinal cord or any other structure after contrast administration. IMPRESSION: Normal appearing cervical cord. Negative for pathologic enhancement. Electronically Signed   By: Inge Rise M.D.   On: 11/06/2020 16:39   MR THORACIC SPINE W WO CONTRAST  Result Date: 11/06/2020 CLINICAL DATA:  Progressive bilateral lower extremity weakness resulting in falls 11/01/2020 and 10/31/2020. EXAM: MRI THORACIC WITHOUT AND WITH CONTRAST TECHNIQUE: Multiplanar and multiecho pulse sequences of the thoracic spine were obtained without and with intravenous contrast. CONTRAST:  10 mL  GADAVIST IV SOLN COMPARISON:  MRI thoracic spine 10/12/2020. FINDINGS: Alignment:  Normal. Vertebrae: No fracture, worrisome lesion or evidence of discitis. A few small Schmorl's nodes are again seen. Small hemangiomas in T9 and T10 are unchanged. Cord: Normal signal throughout. No pathologic enhancement after contrast administration. Paraspinal and other soft tissues: Negative. Disc levels: No change in mild thoracic spondylosis most notable at T4-5 where there is a very shallow right paracentral protrusion without stenosis. IMPRESSION: Normal appearing thoracic cord. No finding to explain the patient's symptoms. No change in small right paracentral protrusion at T4-5 without stenosis. Electronically Signed   By: Inge Rise M.D.   On: 11/06/2020 16:44     LOS: 2 days   Osker Ayoub  Jakari Sada, MD  [facility]    To contact the attending provider between 7A-7P or the covering provider during after hours 7P-7A, please log into the web site [email or web address] and access using universal [place] password for that web site. If you do not have the password, please call the hospital operator.  11/08/2020, 12:08 PM

## 2020-11-08 NOTE — Telephone Encounter (Signed)
Letter has been faxed and mailed. Patient has been notified via Mychart per patient request.

## 2020-11-09 ENCOUNTER — Other Ambulatory Visit: Payer: Self-pay

## 2020-11-09 ENCOUNTER — Inpatient Hospital Stay (HOSPITAL_COMMUNITY): Payer: Medicare HMO

## 2020-11-09 DIAGNOSIS — I10 Essential (primary) hypertension: Secondary | ICD-10-CM | POA: Diagnosis not present

## 2020-11-09 DIAGNOSIS — E1140 Type 2 diabetes mellitus with diabetic neuropathy, unspecified: Secondary | ICD-10-CM | POA: Diagnosis not present

## 2020-11-09 DIAGNOSIS — E1165 Type 2 diabetes mellitus with hyperglycemia: Secondary | ICD-10-CM | POA: Diagnosis not present

## 2020-11-09 LAB — GLUCOSE, CAPILLARY
Glucose-Capillary: 157 mg/dL — ABNORMAL HIGH (ref 70–99)
Glucose-Capillary: 203 mg/dL — ABNORMAL HIGH (ref 70–99)
Glucose-Capillary: 203 mg/dL — ABNORMAL HIGH (ref 70–99)
Glucose-Capillary: 219 mg/dL — ABNORMAL HIGH (ref 70–99)

## 2020-11-09 LAB — CSF CELL COUNT WITH DIFFERENTIAL
RBC Count, CSF: 3 /mm3 — ABNORMAL HIGH
Tube #: 3
WBC, CSF: 3 /mm3 (ref 0–5)

## 2020-11-09 LAB — GLUCOSE, CSF: Glucose, CSF: 95 mg/dL — ABNORMAL HIGH (ref 40–70)

## 2020-11-09 LAB — PROTEIN, CSF: Total  Protein, CSF: 124 mg/dL — ABNORMAL HIGH (ref 15–45)

## 2020-11-09 IMAGING — RF DG SPINAL PUNCT LUMBAR DIAG WITH FL CT GUIDANCE
1 series · 1 of 1 positions shown · non-contrast
Comparison: MRI of the lumbar spine [DATE].

CLINICAL DATA: Weakness.

EXAM:
DIAGNOSTIC LUMBAR PUNCTURE UNDER FLUOROSCOPIC GUIDANCE

[Series 1: cp_standard · 0.17mm/px · 1 of 1 slices shown]
[im 1/1]
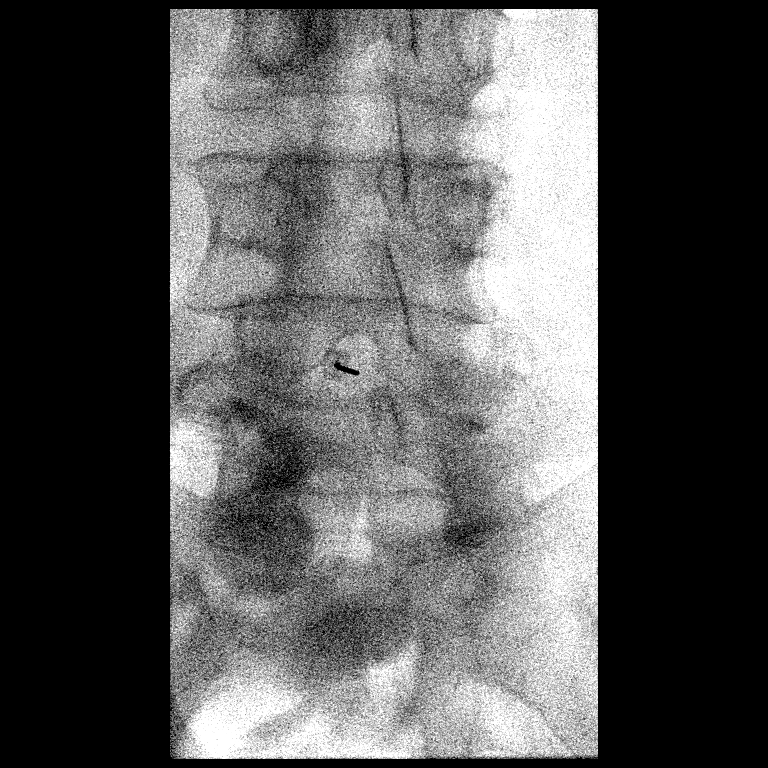

[1 of 1 positions shown; findings below may reference images not displayed]

FLUOROSCOPY TIME:  Fluoroscopy Time:  12 seconds

Radiation Exposure Index (if provided by the fluoroscopic device):
2.0 mGy

Number of Acquired Spot Images: None

PROCEDURE:
Informed consent was obtained from the patient prior to the
procedure, with a discussion of potential complications including
but not limited to, headache, allergy, damage to the spinal
cord/nerve roots, bleeding and pain. With the patient prone, the
lower back was prepped with Betadine. 1% Lidocaine was used for
local anesthesia. Lumbar puncture was performed at the L4-L5 level
using a 20 gauge needle with return of clear CSF. 13 ml of CSF were
obtained for laboratory studies. The patient tolerated the procedure
well without immediate postprocedure complication.
IMPRESSION: Successful L4-L5 fluoroscopically-guided lumbar puncture.

13 mL of CSF were obtained and sent for laboratory studies.

## 2020-11-09 MED ORDER — ENOXAPARIN SODIUM 60 MG/0.6ML ~~LOC~~ SOLN
60.0000 mg | SUBCUTANEOUS | Status: DC
  Administered 2020-11-10 – 2020-11-20 (×11): 60 mg via SUBCUTANEOUS
  Filled 2020-11-09 (×12): qty 0.6

## 2020-11-09 MED ORDER — LIDOCAINE HCL (PF) 1 % IJ SOLN
5.0000 mL | Freq: Once | INTRAMUSCULAR | Status: AC
  Administered 2020-11-09: 5 mL via INTRADERMAL

## 2020-11-09 NOTE — Progress Notes (Signed)
Pt back from lumbar puncture procedure. He is in bed and eating breakfast.    Paulla Fore, RN, BSN

## 2020-11-09 NOTE — Procedures (Signed)
Successful L4-L5 fluoroscopically-guided lumbar puncture.  13 mL of CSF were obtained and sent for laboratory studies.

## 2020-11-09 NOTE — Progress Notes (Signed)
Second attempt in paging neurohospitalists at [phone], regarding lab paperwork for [facility] lab test.   Paulla Fore, RN, BSN
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
## 2020-11-09 NOTE — Progress Notes (Signed)
PROGRESS NOTE        PATIENT DETAILS Name: Tyler Brewer Age: 60 y.o. Sex: male Date of Birth: 1960-12-24 Admit Date: 11/04/2020 Admitting Physician Etta Quill, DO WTU:UEKCM Koren Shiver, DO  Brief Narrative: Patient is a 59 y.o. male with history of DM-2, CAD, OSA-presenting to the hospital with frequent falls-patient has had progressive bilateral lower extremity weakness since August 2021.  Significant events: 3/18>> admit for lower extremity weakness  Significant studies: 2/23>> MRI thoracic spine: No acute abnormalities noted. 3/19>> MRI lumbar sacral spine: No acute process identified. 3/19>> MRI C-spine: Small disc herniation at C3-C4, C4-C5-no abnormal cord signal. 3/19>> vitamin B12: 538 3/19>> HIV: Nonreactive 3/19>> TSH: Normal limits 3/20>> MRI T-spine with contrast: Normal-appearing thoracic cord 3/20>> MRI C-spine: Normal-appearing cervical cord-no pathologic enhancement.  Antimicrobial therapy: None  Microbiology data: None  Procedures : None  Consults: Neurology  DVT Prophylaxis : Prophylactic Lovenox   Subjective: No major issues overnight-has unchanged lower extremity weakness.  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  Fluoroscopic guided lumbar puncture scheduled for today.  Will await further recommendations from nephrology.  Peripheral diabetic nephropathy: Continue Neurontin/Mobic and Cymbalta.  DM-2 (A1c 10.6 on 3/19): CBGs stable-continue Lantus 44 units daily, 8 units of NovoLog with meals and SSI.  Follow and adjust.  Recent Labs    11/08/20 1722 11/08/20 2147 11/09/20 0622  GLUCAP 152* 164* 157*   HTN: BP still on the higher side-amlodipine added 3/22-continue losartan.  Reassess on 3/24.  HLD:  Continue statin  BPH: Continue Flomax  Hyponatremia: Appears to be mild-and chronic-continue to follow periodically.  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  Obesity: Estimated body mass index is 38.35 kg/m as calculated from the following:   Height as of this encounter: 5\' 11"  (1.803 m).   Weight as of this encounter: 124.7 kg.    Diet: Diet Order            Diet Carb Modified Fluid consistency: Thin; Room service appropriate? Yes  Diet effective now                  Code Status: Full code   Family Communication: None at bedside-we will reach out to family over the next few days.  Disposition Plan: Status is: Inpatient  The patient will require care spanning > 2 midnights and should be moved to inpatient because: Inpatient level of care appropriate due to severity of illness  Dispo: The patient is from: Home              Anticipated d/c is to: To be determined              Patient currently is not medically stable to d/c.   Difficult to place patient No    Barriers to Discharge: Severe bilateral lower extremities weakness-unable to ambulate-work-up in process.  Needs inpatient work-up given severity of symptoms.  Antimicrobial agents: Anti-infectives (From admission, onward)   None       Time spent: 15- minutes-Greater than 50% of this time was spent in counseling, explanation of diagnosis, planning of further management, and coordination of care.  MEDICATIONS: Scheduled Meds: . amLODipine  5 mg Oral Daily  . DULoxetine  30 mg  Oral Once per day on Mon Wed Fri  . gabapentin  300 mg Oral 2 times per day  . gabapentin  600 mg Oral Daily  . insulin aspart  0-15 Units Subcutaneous TID WC  . insulin aspart  8 Units Subcutaneous TID WC  . insulin glargine  44 Units Subcutaneous Daily  . losartan  100 mg Oral Daily  . multivitamin with minerals  1 tablet Oral Daily  . nystatin   Topical BID  . pravastatin  40 mg Oral q1800  . Ensure Max  Protein  11 oz Oral BID  . tamsulosin  0.4 mg Oral Daily   Continuous Infusions: PRN Meds:.acetaminophen **OR** acetaminophen, diclofenac Sodium, LORazepam, meloxicam, methocarbamol, ondansetron **OR** ondansetron (ZOFRAN) IV, polyethylene glycol, traMADol   PHYSICAL EXAM: Vital signs: Vitals:   11/08/20 1048 11/08/20 1700 11/08/20 2150 11/09/20 0530  BP: (!) 187/99 (!) 180/98 (!) 150/92 (!) 160/87  Pulse: 86 84 86 83  Resp: 18 18 18 18   Temp: 98.2 F (36.8 C) 98.2 F (36.8 C) 98.3 F (36.8 C) 98.4 F (36.9 C)  TempSrc:   Oral Oral  SpO2: 97% 98% 99% 98%  Weight:      Height:       Filed Weights   11/04/20 1409  Weight: 124.7 kg   Body mass index is 38.35 kg/m.  Gen Exam:Alert awake-not in any distress HEENT:atraumatic, normocephalic Chest: B/L clear to auscultation anteriorly CVS:S1S2 regular Abdomen:soft non tender, non distended Extremities:no edema Neurology: Left leg-unable to lift against mild resistance, right leg, able to lift against mild resistance Skin: no rash I have personally reviewed following labs and imaging studies  LABORATORY DATA: CBC: Recent Labs  Lab 11/04/20 1411  WBC 6.9  HGB 14.4  HCT 40.9  MCV 86.7  PLT 035    Basic Metabolic Panel: Recent Labs  Lab 11/04/20 1411 11/05/20 0718 11/08/20 0400  NA 130*  --  134*  K 3.9  --  4.2  CL 99  --  104  CO2 20*  --  24  GLUCOSE 439*  --  174*  BUN 23*  --  25*  CREATININE 1.17  --  0.97  CALCIUM 8.8*  --  8.8*  MG  --  1.9 1.8    GFR: Estimated Creatinine Clearance: 113 mL/min (by C-G formula based on SCr of 0.97 mg/dL).  Liver Function Tests: No results for input(s): AST, ALT, ALKPHOS, BILITOT, PROT, ALBUMIN in the last 168 hours. No results for input(s): LIPASE, AMYLASE in the last 168 hours. No results for input(s): AMMONIA in the last 168 hours.  Coagulation Profile: No results for input(s): INR, PROTIME in the last 168 hours.  Cardiac Enzymes: No results for input(s):  CKTOTAL, CKMB, CKMBINDEX, TROPONINI in the last 168 hours.  BNP (last 3 results) No results for input(s): PROBNP in the last 8760 hours.  Lipid Profile: No results for input(s): CHOL, HDL, LDLCALC, TRIG, CHOLHDL, LDLDIRECT in the last 72 hours.  Thyroid Function Tests: No results for input(s): TSH, T4TOTAL, FREET4, T3FREE, THYROIDAB in the last 72 hours.  Anemia Panel: No results for input(s): VITAMINB12, FOLATE, FERRITIN, TIBC, IRON, RETICCTPCT in the last 72 hours.  Urine analysis:    Component Value Date/Time   COLORURINE YELLOW 11/05/2020 2029   APPEARANCEUR HAZY (A) 11/05/2020 2029   LABSPEC 1.028 11/05/2020 2029   PHURINE 5.0 11/05/2020 2029   GLUCOSEU >=500 (A) 11/05/2020 2029   GLUCOSEU 100 (A) 09/25/2016 1041   HGBUR NEGATIVE 11/05/2020 2029  BILIRUBINUR NEGATIVE 11/05/2020 2029   BILIRUBINUR neg 04/26/2016 0836   KETONESUR NEGATIVE 11/05/2020 2029   PROTEINUR NEGATIVE 11/05/2020 2029   UROBILINOGEN 0.2 09/25/2016 1041   NITRITE NEGATIVE 11/05/2020 2029   LEUKOCYTESUR NEGATIVE 11/05/2020 2029    Sepsis Labs: Lactic Acid, Venous    Component Value Date/Time   LATICACIDVEN 1.3 01/21/2020 0915    MICROBIOLOGY: Recent Results (from the past 240 hour(s))  SARS CORONAVIRUS 2 (TAT 6-24 HRS) Nasopharyngeal Nasopharyngeal Swab     Status: None   Collection Time: 11/05/20  7:50 AM   Specimen: Nasopharyngeal Swab  Result Value Ref Range Status   SARS Coronavirus 2 NEGATIVE NEGATIVE Final    Comment: (NOTE) SARS-CoV-2 target nucleic acids are NOT DETECTED.  The SARS-CoV-2 RNA is generally detectable in upper and lower respiratory specimens during the acute phase of infection. Negative results do not preclude SARS-CoV-2 infection, do not rule out co-infections with other pathogens, and should not be used as the sole basis for treatment or other patient management decisions. Negative results must be combined with clinical observations, patient history, and  epidemiological information. The expected result is Negative.  Fact Sheet for Patients: SugarRoll.be  Fact Sheet for Healthcare Providers: https://www.woods-mathews.com/  This test is not yet approved or cleared by the [place] FDA and  has been authorized for detection and/or diagnosis of SARS-CoV-2 by FDA under an Emergency Use Authorization (EUA). This EUA will remain  in effect (meaning this test can be used) for the duration of the COVID-19 declaration under Se ction 564(b)(1) of the Act, 21 U.S.C. section 360bbb-3(b)(1), unless the authorization is terminated or revoked sooner.  Performed at [facility], [place] [address], [place], [place] [address]     RADIOLOGY STUDIES/RESULTS: No results found.   LOS: 3 days   Oren Binet, MD  [facility]    To contact the attending provider between 7A-7P or the covering provider during after hours 7P-7A, please log into the web site [email or web address] and access using universal Warner password for that web site. If you do not have the password, please call the hospital operator.  11/09/2020, 9:49 AM

## 2020-11-10 ENCOUNTER — Inpatient Hospital Stay (HOSPITAL_COMMUNITY): Payer: Medicare HMO

## 2020-11-10 DIAGNOSIS — G6181 Chronic inflammatory demyelinating polyneuritis: Principal | ICD-10-CM

## 2020-11-10 DIAGNOSIS — R29898 Other symptoms and signs involving the musculoskeletal system: Secondary | ICD-10-CM | POA: Diagnosis not present

## 2020-11-10 HISTORY — PX: IR FLUORO GUIDE CV LINE RIGHT: IMG2283

## 2020-11-10 HISTORY — PX: IR US GUIDE VASC ACCESS RIGHT: IMG2390

## 2020-11-10 LAB — IGG CSF INDEX
Albumin CSF-mCnc: 74 mg/dL — ABNORMAL HIGH (ref 15–55)
Albumin: 3.3 g/dL — ABNORMAL LOW (ref 3.8–4.9)
CSF IgG Index: 0.6 (ref 0.0–0.7)
IgG (Immunoglobin G), Serum: 666 mg/dL (ref 603–1613)
IgG, CSF: 9.3 mg/dL (ref 0.0–10.3)
IgG/Alb Ratio, CSF: 0.13 (ref 0.00–0.25)

## 2020-11-10 LAB — GLUCOSE, CAPILLARY
Glucose-Capillary: 189 mg/dL — ABNORMAL HIGH (ref 70–99)
Glucose-Capillary: 149 mg/dL — ABNORMAL HIGH (ref 70–99)
Glucose-Capillary: 202 mg/dL — ABNORMAL HIGH (ref 70–99)
Glucose-Capillary: 225 mg/dL — ABNORMAL HIGH (ref 70–99)

## 2020-11-10 LAB — VITAMIN B6: Vitamin B6: 10.2 ug/L (ref 5.3–46.7)

## 2020-11-10 LAB — CYTOLOGY - NON PAP

## 2020-11-10 LAB — VDRL, CSF: VDRL Quant, CSF: NONREACTIVE

## 2020-11-10 IMAGING — US IR FLUORO GUIDE CV LINE*R*
2 series · 3 of 3 positions shown · non-contrast
Comparison: none

INDICATION: 59-year-old male referred for temporary hemodialysis/plasmapheresis
catheter placement

[Series 1: fl angio · 1 of 1 slices shown]
[im 1/1]
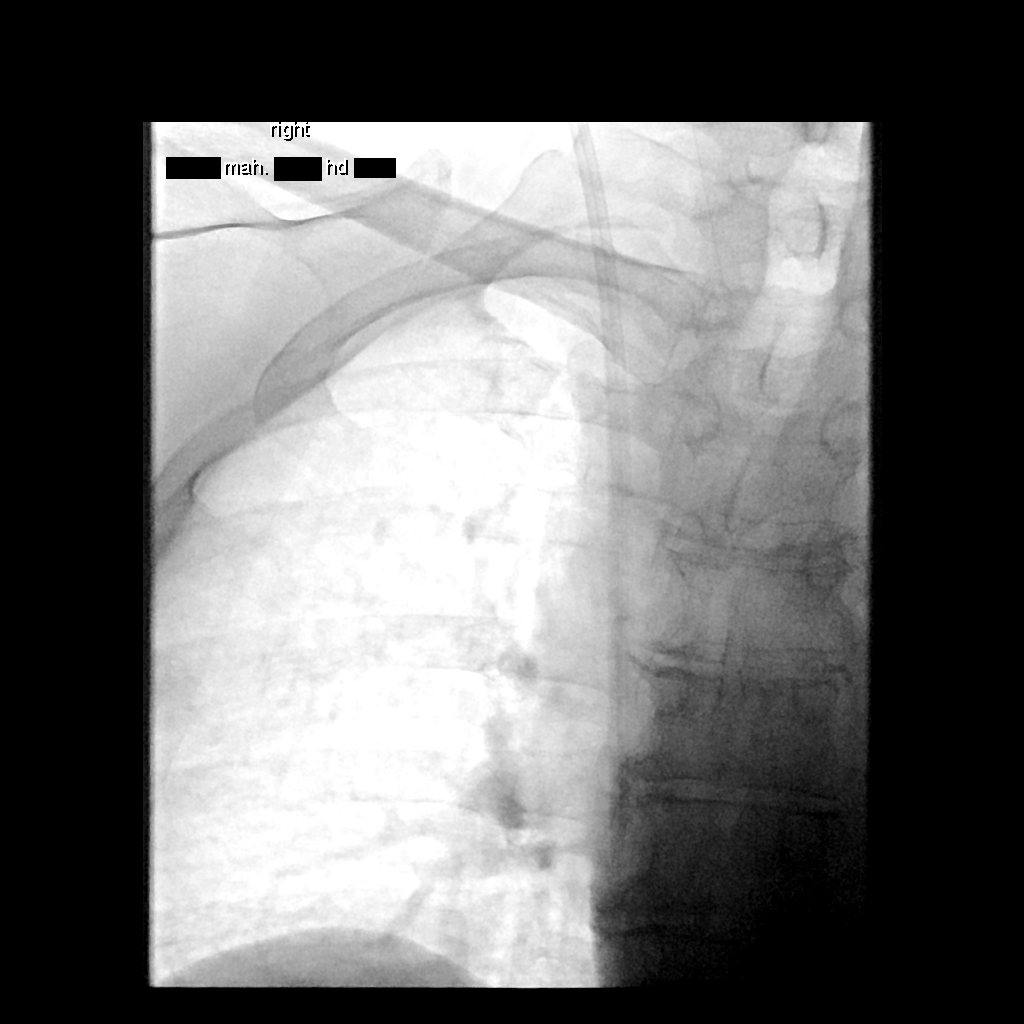

[Series 1: ir fluoro/shunt/fist · 2 of 2 slices shown]
[im 1/2]
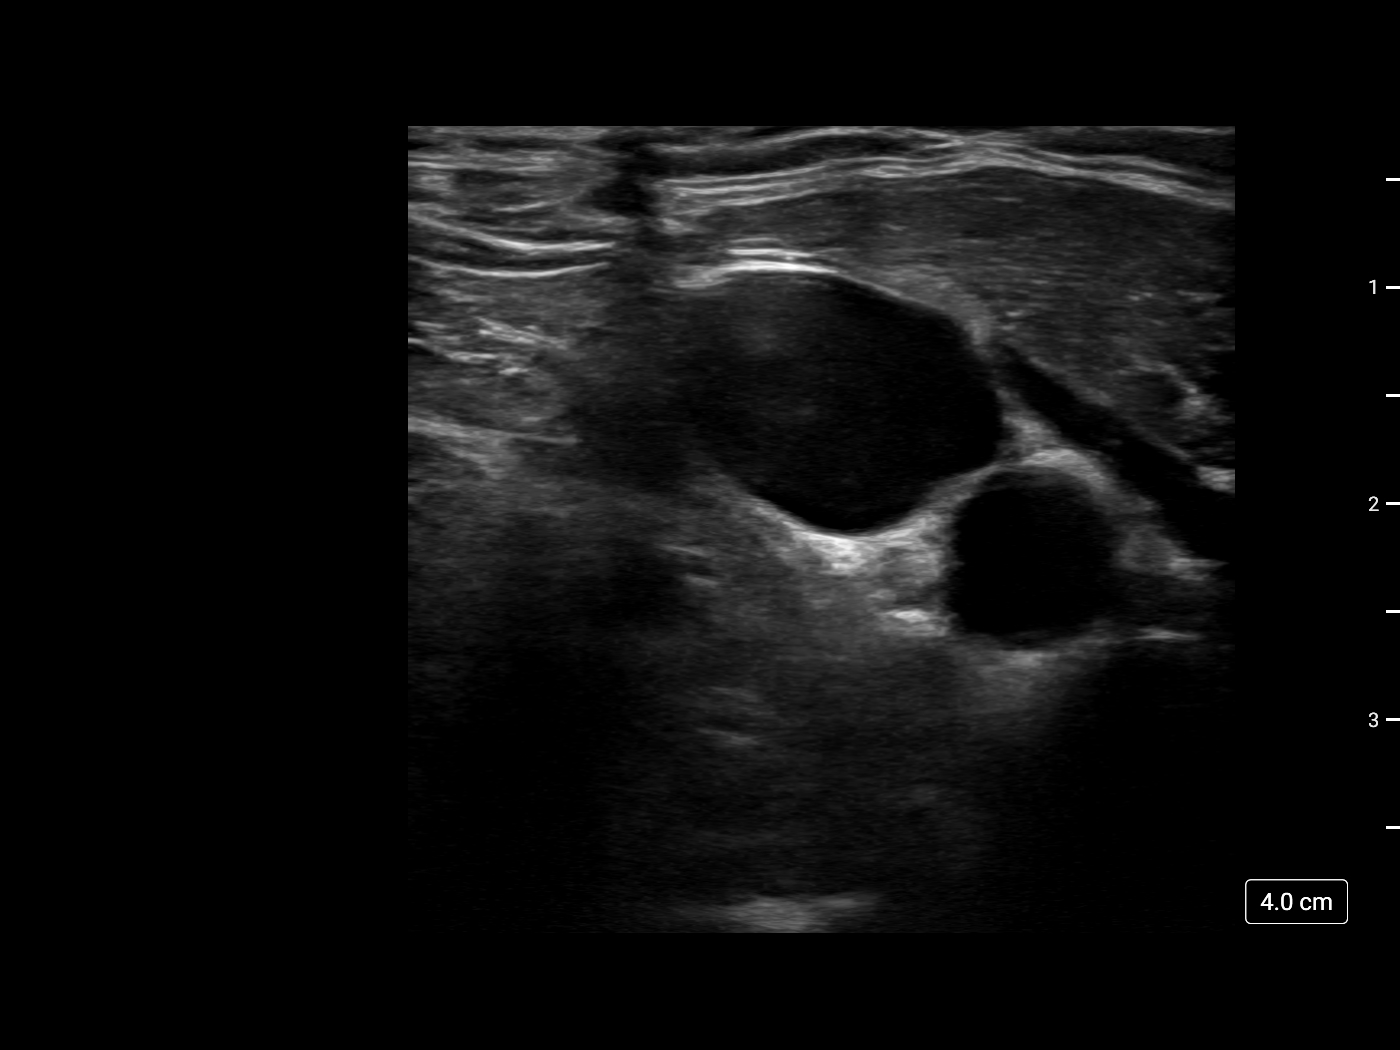
[im 2/2]
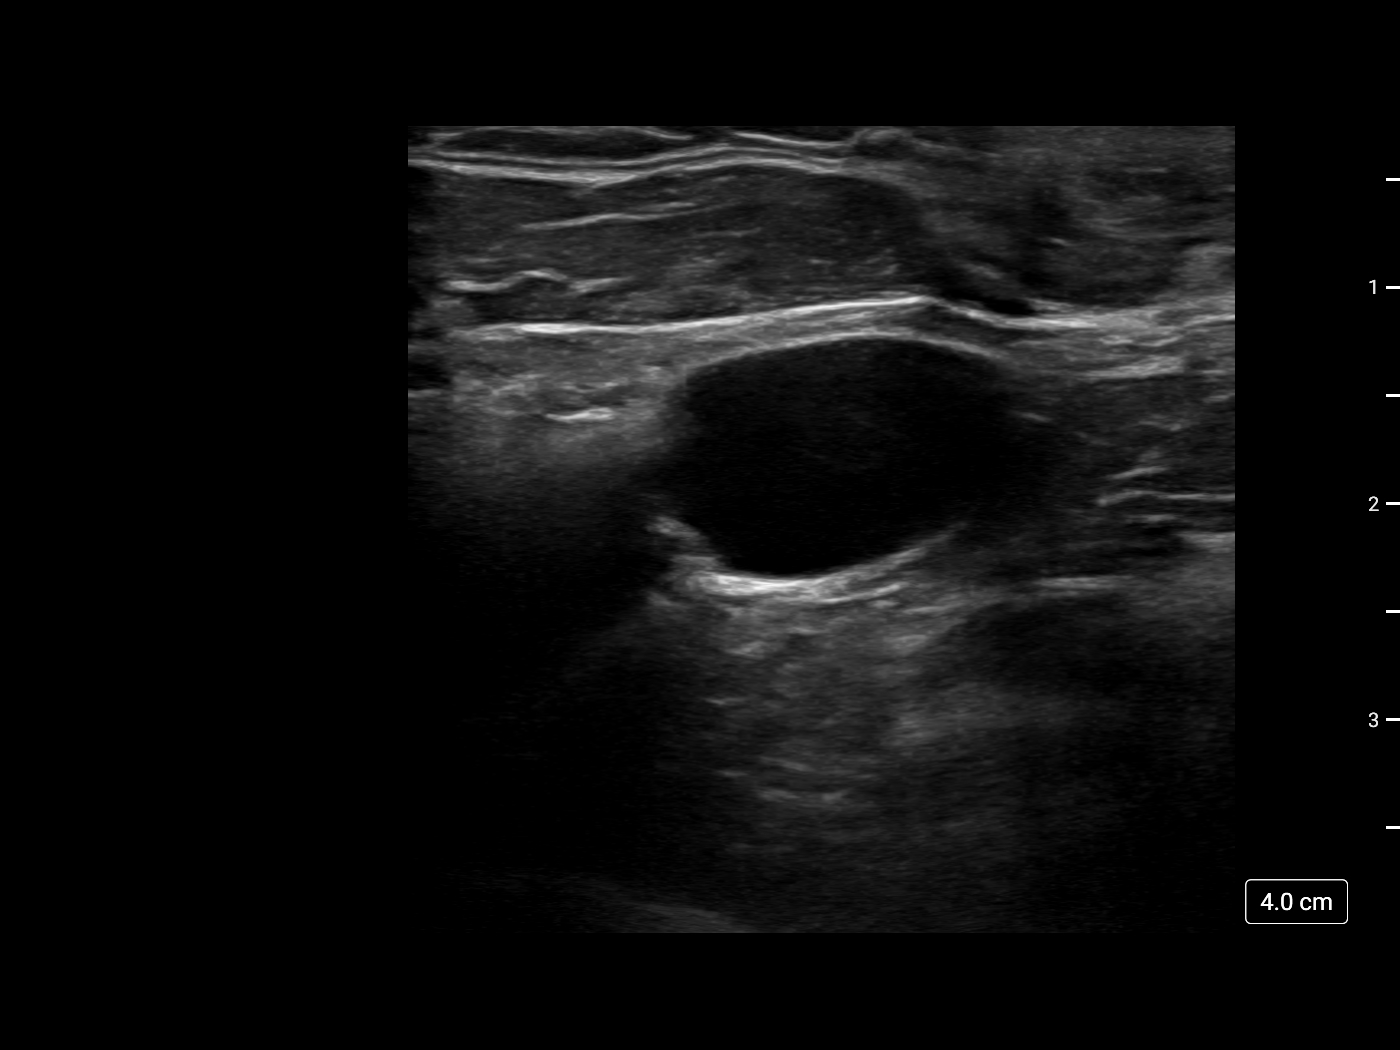

[3 of 3 positions shown; findings below may reference images not displayed]

EXAM:
IMAGE GUIDED PLACEMENT OF TEMPORARY PLASMAPHERESIS CATHETER/HD
CATHETER

MEDICATIONS:
None

ANESTHESIA/SEDATION:
None.

FLUOROSCOPY TIME:  Fluoroscopy Time: 0 minutes 12 seconds (10 mGy).

COMPLICATIONS:
None

PROCEDURE:
Informed written consent was obtained from the patient's family
after a discussion of the risks, benefits, and alternatives to
treatment. Questions regarding the procedure were encouraged and
answered. The right neck was prepped with chlorhexidine in a sterile
fashion, and a sterile drape was applied covering the operative
field. Maximum barrier sterile technique with sterile gowns and
gloves were used for the procedure. A timeout was performed prior to
the initiation of the procedure.

A micropuncture kit was utilized to access the right internal
jugular vein under direct, real-time ultrasound guidance after the
overlying soft tissues were anesthetized with 1% lidocaine with
epinephrine. Ultrasound image documentation was performed. The
microwire was kinked to measure appropriate catheter length. A stiff
glidewire was advanced to the level of the IVC. A 20 cm hemodialysis
catheter was then placed over the wire.

Final catheter positioning was confirmed and documented with a spot
radiographic image. The catheter aspirates and flushes normally. The
catheter was flushed with appropriate volume heparin dwells.

Dressings were applied. The patient tolerated the procedure well
without immediate post procedural complication.
IMPRESSION: Status post right IJ temporary hemodialysis catheter.

## 2020-11-10 MED ORDER — LIDOCAINE HCL (PF) 1 % IJ SOLN
INTRAMUSCULAR | Status: DC | PRN
  Administered 2020-11-10: 5 mL

## 2020-11-10 MED ORDER — CHLORHEXIDINE GLUCONATE CLOTH 2 % EX PADS
6.0000 | MEDICATED_PAD | Freq: Every day | CUTANEOUS | Status: DC
  Administered 2020-11-10 – 2020-11-20 (×8): 6 via TOPICAL

## 2020-11-10 MED ORDER — HEPARIN SODIUM (PORCINE) 1000 UNIT/ML IJ SOLN
INTRAMUSCULAR | Status: AC
  Filled 2020-11-10: qty 1

## 2020-11-10 MED ORDER — LIDOCAINE HCL 1 % IJ SOLN
INTRAMUSCULAR | Status: AC
  Filled 2020-11-10: qty 20

## 2020-11-10 NOTE — Care Management Important Message (Signed)
Important Message  Patient Details  Name: Tyler Brewer MRN: [id number] Date of Birth: 09-Mar-1961   Medicare Important Message Given:  Yes     [name] P [place] 11/10/2020, 1:07 PM

## 2020-11-10 NOTE — Procedures (Signed)
Interventional Radiology Procedure Note  Procedure:   Image guided right IJ temp HD cath, 20cm.   .  Complications: None  Recommendations:  - Ok to use - Do not submerge - Routine line care   Signed,  Dulcy Fanny. Earleen Newport, DO

## 2020-11-10 NOTE — Progress Notes (Deleted)
DISCHARGE NOTE HOME Johnson [place] to be discharged Home per MD order. Discussed prescriptions and follow up appointments with the patient. Prescriptions given to patient; medication list explained in detail. Patient verbalized understanding.  Skin clean, dry and intact without evidence of skin break down, no evidence of skin tears noted. IV catheter discontinued intact. Site without signs and symptoms of complications. Dressing and pressure applied. Pt denies pain at the site currently. No complaints noted.  Patient free of lines, drains, and wounds.   An After Visit Summary (AVS) was printed and given to the patient with no questions made. Patient escorted via wheelchair, and discharged home via private auto.  Dolores Hoose, RN, BSN

## 2020-11-10 NOTE — Progress Notes (Signed)
Patient ID: Tyler Brewer, male   DOB: 1961/03/15, 60 y.o.   MRN: [id number] Request received for apheresis catheter placement in patient with working diagnosis of CIDP.  Details/risks of procedure, including but not limited to, internal bleeding, infection, injury to adjacent structures discussed with patient with his understanding and consent.  Procedure tentatively scheduled for later today.
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
## 2020-11-10 NOTE — Consult Note (Signed)
   [facility] CM Inpatient Consult   11/10/2020  Tyler Brewer 09/30/1960 [id number]   [facility] [ACO] Patient: Tyler Brewer Medicare  Primary Care Provider:  Carollee Herter, Alferd Apa, DO an Embedded provider with Chronic Disease Management team   Patient screened for hospitalization with noted extreme high risk score for unplanned readmission risk and for multiple ED visits 11 in the past 6 months to assess for potential [facility] service needs for post hospital transition.  Review of patient's medical record reveals patient is being recommended for SNF however may progress to [facility] or OP at this time.  Plan:  Continue to follow progress and disposition to assess for post hospital care management needs.  Will assess for post hospital referral for the [facility] [SW] for follow up needs, if appropriate.  This provider is listed to do the TOC follow up calls and appointments as well.  For questions contact:   Natividad Brood, RN BSN [facility] Liaison  Toll free office [phone]  Fax number: [phone] [email or web address]

## 2020-11-10 NOTE — Plan of Care (Signed)
  Problem: Education: Goal: Knowledge of General Education information will improve Description: Including pain rating scale, medication(s)/side effects and non-pharmacologic comfort measures Outcome: Progressing   Problem: Activity: Goal: Risk for activity intolerance will decrease Outcome: Progressing   Problem: Coping: Goal: Level of anxiety will decrease Outcome: Progressing   

## 2020-11-10 NOTE — Progress Notes (Addendum)
Subjective: No change to his upper and lower extremity asymmetric weakness.   Objective: Current vital signs: BP (!) 148/82   Pulse 81   Temp 97.7 F (36.5 C)   Resp 20   Ht 5\' 11"  (1.803 m)   Wt 124.7 kg   SpO2 97%   BMI 38.35 kg/m  Vital signs in last 24 hours: Temp:  [97.7 F (36.5 C)-98.5 F (36.9 C)] 97.7 F (36.5 C) (03/24 0916) Pulse Rate:  [81-94] 81 (03/24 0916) Resp:  [17-20] 20 (03/24 0916) BP: (123-164)/(73-89) 148/82 (03/24 0916) SpO2:  [97 %-99 %] 97 % (03/24 0916)  Intake/Output from previous day: 03/23 0701 - 03/24 0700 In: 720 [P.O.:720] Out: 0  Intake/Output this shift: No intake/output data recorded. Nutritional status:  Diet Order            Diet Carb Modified Fluid consistency: Thin; Room service appropriate? Yes  Diet effective now                 Neuro:  Mental Status: Awake, alert, oriented. No aphasia noted.   Cranial Nerves: EOMI. Face symmetric Motor:   4+/5 strength in RUE  4-/5 strength in LUE.  Spontaneous antigravity movement bilateral upper extremities but requires some effort on the left. LLE: 3/5 from hip to ankles with some attempt at antigravity movement but with immediate drift to bed. Ankle dorsiflexion and plantar flexion 2/5 on the left. RLE: 4-/5 strength at the hip with 4/5 strength more distally to the ankle .   Sensory: Asymmetric stocking distribution neuropathy  DTR: Hypoactive reflexes Cerebellar: No ataxia noted  Gait: Deferred  Lab Results: Results for orders placed or performed during the hospital encounter of 11/04/20 (from the past 48 hour(s))  Glucose, capillary     Status: Abnormal   Collection Time: 11/08/20 11:59 AM  Result Value Ref Range   Glucose-Capillary 155 (H) 70 - 99 mg/dL    Comment: Glucose reference range applies only to samples taken after fasting for at least 8 hours.  Glucose, capillary     Status: Abnormal   Collection Time: 11/08/20  5:22 PM  Result Value Ref Range    Glucose-Capillary 152 (H) 70 - 99 mg/dL    Comment: Glucose reference range applies only to samples taken after fasting for at least 8 hours.  Glucose, capillary     Status: Abnormal   Collection Time: 11/08/20  9:47 PM  Result Value Ref Range   Glucose-Capillary 164 (H) 70 - 99 mg/dL    Comment: Glucose reference range applies only to samples taken after fasting for at least 8 hours.  Glucose, capillary     Status: Abnormal   Collection Time: 11/09/20  6:22 AM  Result Value Ref Range   Glucose-Capillary 157 (H) 70 - 99 mg/dL    Comment: Glucose reference range applies only to samples taken after fasting for at least 8 hours.  Glucose, CSF     Status: Abnormal   Collection Time: 11/09/20  9:38 AM  Result Value Ref Range   Glucose, CSF 95 (H) 40 - 70 mg/dL    Comment: Performed at [facility] [address] [place], [place] [address]  Protein, CSF     Status: Abnormal   Collection Time: 11/09/20  9:38 AM  Result Value Ref Range   Total  Protein, CSF 124 (H) 15 - 45 mg/dL    Comment: Performed at [facility] [address] [place], [place] [address]  CSF cell  count with differential     Status: Abnormal   Collection Time: 11/09/20  9:38 AM  Result Value Ref Range   Tube # 3    Color, CSF COLORLESS COLORLESS   Appearance, CSF CLEAR CLEAR   Supernatant NOT INDICATED    RBC Count, CSF 3 (H) 0 /cu mm   WBC, CSF 3 0 - 5 /cu mm   Other Cells, CSF TOO FEW TO COUNT, SMEAR AVAILABLE FOR REVIEW     Comment: FEW LYMPOCYTES, FEW MONOCYTES, RARE NEUTROPHILS Performed at [facility] [address] [place], [place] [address]   CSF culture w Gram Stain     Status: None (Preliminary result)   Collection Time: 11/09/20  9:38 AM   Specimen: PATH Cytology CSF; Cerebrospinal Fluid  Result Value Ref Range   Specimen Description CSF    Special Requests NONE    Gram Stain      WBC PRESENT, PREDOMINANTLY MONONUCLEAR NO ORGANISMS SEEN CYTOSPIN SMEAR    Culture      NO  GROWTH < 24 HOURS Performed at [facility], [place] [address] [place], [place] [address]    Report Status PENDING   Glucose, capillary     Status: Abnormal   Collection Time: 11/09/20 12:42 PM  Result Value Ref Range   Glucose-Capillary 203 (H) 70 - 99 mg/dL    Comment: Glucose reference range applies only to samples taken after fasting for at least 8 hours.  Glucose, capillary     Status: Abnormal   Collection Time: 11/09/20  5:23 PM  Result Value Ref Range   Glucose-Capillary 203 (H) 70 - 99 mg/dL    Comment: Glucose reference range applies only to samples taken after fasting for at least 8 hours.  Glucose, capillary     Status: Abnormal   Collection Time: 11/09/20  9:10 PM  Result Value Ref Range   Glucose-Capillary 219 (H) 70 - 99 mg/dL    Comment: Glucose reference range applies only to samples taken after fasting for at least 8 hours.  Glucose, capillary     Status: Abnormal   Collection Time: 11/10/20  7:33 AM  Result Value Ref Range   Glucose-Capillary 189 (H) 70 - 99 mg/dL    Comment: Glucose reference range applies only to samples taken after fasting for at least 8 hours.    Recent Results (from the past 240 hour(s))  SARS CORONAVIRUS 2 (TAT 6-24 HRS) Nasopharyngeal Nasopharyngeal Swab     Status: None   Collection Time: 11/05/20  7:50 AM   Specimen: Nasopharyngeal Swab  Result Value Ref Range Status   SARS Coronavirus 2 NEGATIVE NEGATIVE Final    Comment: (NOTE) SARS-CoV-2 target nucleic acids are NOT DETECTED.  The SARS-CoV-2 RNA is generally detectable in upper and lower respiratory specimens during the acute phase of infection. Negative results do not preclude SARS-CoV-2 infection, do not rule out co-infections with other pathogens, and should not be used as the sole basis for treatment or other patient management decisions. Negative results must be combined with clinical observations, patient history, and epidemiological information. The expected result  is Negative.  Fact Sheet for Patients: SugarRoll.be  Fact Sheet for Healthcare Providers: https://www.woods-mathews.com/  This test is not yet approved or cleared by the [place] FDA and  has been authorized for detection and/or diagnosis of SARS-CoV-2 by FDA under an Emergency Use Authorization (EUA). This EUA will remain  in effect (meaning this test can be used) for the duration of  the COVID-19 declaration under Se ction 564(b)(1) of the Act, 21 U.S.C. section 360bbb-3(b)(1), unless the authorization is terminated or revoked sooner.  Performed at [facility], [place] [address], [place], [place] [address]   CSF culture w Gram Stain     Status: None (Preliminary result)   Collection Time: 11/09/20  9:38 AM   Specimen: PATH Cytology CSF; Cerebrospinal Fluid  Result Value Ref Range Status   Specimen Description CSF  Final   Special Requests NONE  Final   Gram Stain   Final    WBC PRESENT, PREDOMINANTLY MONONUCLEAR NO ORGANISMS SEEN CYTOSPIN SMEAR    Culture   Final    NO GROWTH < 24 HOURS Performed at [facility], [place] [address] [place], [place] [address]    Report Status PENDING  Incomplete    Lipid Panel No results for input(s): CHOL, TRIG, HDL, CHOLHDL, VLDL, LDLCALC in the last 72 hours.  Studies/Results: DG FL GUIDED LUMBAR PUNCTURE  Result Date: 11/09/2020 CLINICAL DATA:  Weakness. EXAM: DIAGNOSTIC LUMBAR PUNCTURE UNDER FLUOROSCOPIC GUIDANCE COMPARISON:  MRI of the lumbar spine 11/05/2020. FLUOROSCOPY TIME:  Fluoroscopy Time:  12 seconds Radiation Exposure Index (if provided by the fluoroscopic device): 2.0 mGy Number of Acquired Spot Images: None PROCEDURE: Informed consent was obtained from the patient prior to the procedure, with a discussion of potential complications including but not limited to, headache, allergy, damage to the spinal cord/nerve roots, bleeding and pain. With the patient prone, the lower  back was prepped with Betadine. 1% Lidocaine was used for local anesthesia. Lumbar puncture was performed at the L4-L5 level using a 20 gauge needle with return of clear CSF. 13 ml of CSF were obtained for laboratory studies. The patient tolerated the procedure well without immediate postprocedure complication. IMPRESSION: Successful L4-L5 fluoroscopically-guided lumbar puncture. 13 mL of CSF were obtained and sent for laboratory studies. Electronically Signed   By: Kellie Simmering DO   On: 11/09/2020 09:55    Medications:  Scheduled: . amLODipine  5 mg Oral Daily  . DULoxetine  30 mg Oral Once per day on Mon Wed Fri  . enoxaparin (LOVENOX) injection  60 mg Subcutaneous Q24H  . gabapentin  300 mg Oral 2 times per day  . gabapentin  600 mg Oral Daily  . insulin aspart  0-15 Units Subcutaneous TID WC  . insulin aspart  8 Units Subcutaneous TID WC  . insulin glargine  44 Units Subcutaneous Daily  . losartan  100 mg Oral Daily  . multivitamin with minerals  1 tablet Oral Daily  . nystatin   Topical BID  . pravastatin  40 mg Oral q1800  . Ensure Max Protein  11 oz Oral BID  . tamsulosin  0.4 mg Oral Daily      Assessment: 60 y.o.malewith PMH significant for CAD, depression, poorly controlled DM-2, narcotic addiction, HLD, HTN, obesity, neuropathy, OSA who presented to ED with recurrent fallsdue to progressive BLE weakness. - Hisneurologic examination is notable for left greater than right BLE weakness with sensory neuropathy.He also has some asymmetric upper extremity weakness. 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 Upper extremities are also weak which raised the question of a possible multifocal motor neuropathy or atypical presentation of CIDP.  - CSF sample reveals albuminocytologic dissociation, with normal WBC but elevated protein of 124. Glucose 95 and RBC 3. Sample was clear and  colorless.  - Work up thus  far for possible causes of amyotrophy has been unrevealing; HIV negative, CMV PCR negative; pending Arbovirus/West Nile panel results. - Patient with history of diabetic neuropathy with continued uncontrolled diabetes mellitus: A1c of 10.6%, urine glucose >500, persistently elevated blood glucose. Likely contributes to falls, decreased conditioning, and pain in lower extremities.   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 Risks include severe hypotension with associated complications, fatigue and infection. Benefits include more rapid recovery of his strength and prevention of further deterioration. The patient expressed understanding and wishes to proceed with PLEX. He understands that this entails central line placement and a 9 day course of exchanges, which will take place QOD.  - Please call Radiology for apheresis catheter placement - Neurology is placing orders for PLEX - Glycemic control    LOS: 4 days   @Electronically  signed: Dr. Kerney Elbe 11/10/2020  10:24 AM

## 2020-11-10 NOTE — Progress Notes (Signed)
PROGRESS NOTE        PATIENT DETAILS Name: Tyler Brewer Age: 60 y.o. Sex: male Date of Birth: [birth year] Admit Date: 11/04/2020 Admitting Physician Etta Quill, DO EZM:OQHUT Koren Shiver, DO  Brief Narrative: Patient is a 60 y.o. male with history of DM-2, CAD, OSA-presenting to the hospital with frequent falls-patient has had progressive bilateral lower extremity weakness since August 2021.  Significant events: 3/18>> admit for lower extremity weakness  Significant studies: 2/23>> MRI thoracic spine: No acute abnormalities noted. 3/19>> MRI lumbar sacral spine: No acute process identified. 3/19>> MRI C-spine: Small disc herniation at C3-C4, C4-C5-no abnormal cord signal. 3/19>> vitamin B12: 538 3/19>> HIV: Nonreactive 3/19>> TSH: Normal limits 3/19>> vitamin B6: Pending 3/19>> HSV 1/HSV 2 DNA by PCR blood: Negative 3/19>> CMV DNA by PCR blood>> negative 3/20>> MRI T-spine with contrast: Normal-appearing thoracic cord 3/20>> MRI C-spine: Normal-appearing cervical cord-no pathologic enhancement. 3/23>> CSF: WBC 3, protein 124 3/23>> arbovirus panel- CSF: Pending   Antimicrobial therapy: None  Microbiology data: 3/23>> CSF culture: No growth  Procedures : 3/23>> fluoroscopy-guided lumbar puncture  Consults: Neurology  DVT Prophylaxis : Prophylactic Lovenox   Subjective: No major issues overnight-has unchanged lower extremity weakness.  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    Peripheral diabetic nephropathy: Continue Neurontin/Mobic and Cymbalta.  DM-2 (A1c 10.6 on 3/19): CBGs stable-continue Lantus 44 units daily, 8 units of NovoLog with meals and SSI.  Follow and adjust.  Recent Labs    11/09/20 2110 11/10/20 0733 11/10/20 1114   GLUCAP 219* 189* 149*   HTN: BP still on the higher side-amlodipine added 3/22-continue losartan.  Reassess on 3/24.  HLD: Continue statin  BPH: Continue Flomax  Hyponatremia: Appears to be mild-and chronic-continue to follow periodically.  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  Obesity: Estimated body mass index is 38.35 kg/m as calculated from the following:   Height as of this encounter: 5\' 11"  (1.803 m).   Weight as of this encounter: 124.7 kg.    Diet: Diet Order            Diet Carb Modified Fluid consistency: Thin; Room service appropriate? Yes  Diet effective now                  Code Status: Full code   Family Communication: None at bedside-we will reach out to family over the next few days.  Disposition Plan: Status is: Inpatient  The patient will require care spanning > 2 midnights and should be moved to inpatient because: Inpatient level of care appropriate due to severity of illness  Dispo: The patient is from: Home              Anticipated d/c is to: To be determined              Patient currently is not medically stable to d/c.   Difficult to place patient No    Barriers to Discharge: Severe bilateral lower extremities weakness-unable to ambulate-work-up in process.  Needs inpatient work-up given severity of symptoms.  Antimicrobial agents: Anti-infectives (From admission, onward)   None       Time spent: 25- minutes-Greater than 50% of this time was spent in counseling, explanation of diagnosis, planning of further  management, and coordination of care.  MEDICATIONS: Scheduled Meds: . amLODipine  5 mg Oral Daily  . DULoxetine  30 mg Oral Once per day on Mon Wed Fri  . enoxaparin (LOVENOX) injection  60 mg Subcutaneous Q24H  . gabapentin  300 mg Oral 2 times per day  . gabapentin  600 mg Oral Daily  . insulin aspart  0-15 Units Subcutaneous TID WC  . insulin aspart  8 Units Subcutaneous TID WC  . insulin glargine   44 Units Subcutaneous Daily  . losartan  100 mg Oral Daily  . multivitamin with minerals  1 tablet Oral Daily  . nystatin   Topical BID  . pravastatin  40 mg Oral q1800  . Ensure Max Protein  11 oz Oral BID  . tamsulosin  0.4 mg Oral Daily   Continuous Infusions: PRN Meds:.acetaminophen **OR** acetaminophen, diclofenac Sodium, LORazepam, meloxicam, methocarbamol, ondansetron **OR** ondansetron (ZOFRAN) IV, polyethylene glycol, traMADol   PHYSICAL EXAM: Vital signs: Vitals:   11/09/20 1743 11/09/20 2020 11/10/20 0457 11/10/20 0916  BP: 123/89 136/76 (!) 164/73 (!) 148/82  Pulse: 94 86 82 81  Resp: 18 17 20 20   Temp: 98.2 F (36.8 C) 98.3 F (36.8 C) 98.5 F (36.9 C) 97.7 F (36.5 C)  TempSrc: Oral  Oral   SpO2: 99% 98% 98% 97%  Weight:      Height:       Filed Weights   11/04/20 1409  Weight: 124.7 kg   Body mass index is 38.35 kg/m.   Gen Exam:Alert awake-not in any distress HEENT:atraumatic, normocephalic Chest: B/L clear to auscultation anteriorly CVS:S1S2 regular Abdomen:soft non tender, non distended Extremities:no edema Neurology: Left leg unable to lift against mild resistance, right leg able to lift against mild resistance Skin: no rash  I have personally reviewed following labs and imaging studies  LABORATORY DATA: CBC: Recent Labs  Lab 11/04/20 1411  WBC 6.9  HGB 14.4  HCT 40.9  MCV 86.7  PLT 283    Basic Metabolic Panel: Recent Labs  Lab 11/04/20 1411 11/05/20 0718 11/08/20 0400  NA 130*  --  134*  K 3.9  --  4.2  CL 99  --  104  CO2 20*  --  24  GLUCOSE 439*  --  174*  BUN 23*  --  25*  CREATININE 1.17  --  0.97  CALCIUM 8.8*  --  8.8*  MG  --  1.9 1.8    GFR: Estimated Creatinine Clearance: 113 mL/min (by C-G formula based on SCr of 0.97 mg/dL).  Liver Function Tests: No results for input(s): AST, ALT, ALKPHOS, BILITOT, PROT, ALBUMIN in the last 168 hours. No results for input(s): LIPASE, AMYLASE in the last 168 hours. No  results for input(s): AMMONIA in the last 168 hours.  Coagulation Profile: No results for input(s): INR, PROTIME in the last 168 hours.  Cardiac Enzymes: No results for input(s): CKTOTAL, CKMB, CKMBINDEX, TROPONINI in the last 168 hours.  BNP (last 3 results) No results for input(s): PROBNP in the last 8760 hours.  Lipid Profile: No results for input(s): CHOL, HDL, LDLCALC, TRIG, CHOLHDL, LDLDIRECT in the last 72 hours.  Thyroid Function Tests: No results for input(s): TSH, T4TOTAL, FREET4, T3FREE, THYROIDAB in the last 72 hours.  Anemia Panel: No results for input(s): VITAMINB12, FOLATE, FERRITIN, TIBC, IRON, RETICCTPCT in the last 72 hours.  Urine analysis:    Component Value Date/Time   COLORURINE YELLOW 11/05/2020 2029   APPEARANCEUR HAZY (A) 11/05/2020 2029  LABSPEC 1.028 11/05/2020 2029   PHURINE 5.0 11/05/2020 2029   GLUCOSEU >=500 (A) 11/05/2020 2029   GLUCOSEU 100 (A) 09/25/2016 1041   HGBUR NEGATIVE 11/05/2020 2029   BILIRUBINUR NEGATIVE 11/05/2020 2029   BILIRUBINUR neg 04/26/2016 0836   KETONESUR NEGATIVE 11/05/2020 2029   PROTEINUR NEGATIVE 11/05/2020 2029   UROBILINOGEN 0.2 09/25/2016 1041   NITRITE NEGATIVE 11/05/2020 2029   LEUKOCYTESUR NEGATIVE 11/05/2020 2029    Sepsis Labs: Lactic Acid, Venous    Component Value Date/Time   LATICACIDVEN 1.3 01/21/2020 0915    MICROBIOLOGY: Recent Results (from the past 240 hour(s))  SARS CORONAVIRUS 2 (TAT 6-24 HRS) Nasopharyngeal Nasopharyngeal Swab     Status: None   Collection Time: 11/05/20  7:50 AM   Specimen: Nasopharyngeal Swab  Result Value Ref Range Status   SARS Coronavirus 2 NEGATIVE NEGATIVE Final    Comment: (NOTE) SARS-CoV-2 target nucleic acids are NOT DETECTED.  The SARS-CoV-2 RNA is generally detectable in upper and lower respiratory specimens during the acute phase of infection. Negative results do not preclude SARS-CoV-2 infection, do not rule out co-infections with other pathogens, and  should not be used as the sole basis for treatment or other patient management decisions. Negative results must be combined with clinical observations, patient history, and epidemiological information. The expected result is Negative.  Fact Sheet for Patients: SugarRoll.be  Fact Sheet for Healthcare Providers: https://www.woods-mathews.com/  This test is not yet approved or cleared by the [place] FDA and  has been authorized for detection and/or diagnosis of SARS-CoV-2 by FDA under an Emergency Use Authorization (EUA). This EUA will remain  in effect (meaning this test can be used) for the duration of the COVID-19 declaration under Se ction 564(b)(1) of the Act, 21 U.S.C. section 360bbb-3(b)(1), unless the authorization is terminated or revoked sooner.  Performed at [facility], [place] [address], [place], [place] [address]   CSF culture w Gram Stain     Status: None (Preliminary result)   Collection Time: 11/09/20  9:38 AM   Specimen: PATH Cytology CSF; Cerebrospinal Fluid  Result Value Ref Range Status   Specimen Description CSF  Final   Special Requests NONE  Final   Gram Stain   Final    WBC PRESENT, PREDOMINANTLY MONONUCLEAR NO ORGANISMS SEEN CYTOSPIN SMEAR    Culture   Final    NO GROWTH < 24 HOURS Performed at [facility], [place] [address], [place], [place] [address]    Report Status PENDING  Incomplete    RADIOLOGY STUDIES/RESULTS: DG FL GUIDED LUMBAR PUNCTURE  Result Date: 11/09/2020 CLINICAL DATA:  Weakness. EXAM: DIAGNOSTIC LUMBAR PUNCTURE UNDER FLUOROSCOPIC GUIDANCE COMPARISON:  MRI of the lumbar spine 11/05/2020. FLUOROSCOPY TIME:  Fluoroscopy Time:  12 seconds Radiation Exposure Index (if provided by the fluoroscopic device): 2.0 mGy Number of Acquired Spot Images: None PROCEDURE: Informed consent was obtained from the patient prior to the procedure, with a discussion of potential complications including  but not limited to, headache, allergy, damage to the spinal cord/nerve roots, bleeding and pain. With the patient prone, the lower back was prepped with Betadine. 1% Lidocaine was used for local anesthesia. Lumbar puncture was performed at the L4-L5 level using a 20 gauge needle with return of clear CSF. 13 ml of CSF were obtained for laboratory studies. The patient tolerated the procedure well without immediate postprocedure complication. IMPRESSION: Successful L4-L5 fluoroscopically-guided lumbar puncture. 13 mL of CSF were obtained and sent for laboratory studies. Electronically Signed  By: Kellie Simmering DO   On: 11/09/2020 09:55     LOS: 4 days   Oren Binet, MD  [facility]    To contact the attending provider between 7A-7P or the covering provider during after hours 7P-7A, please log into the web site [email or web address] and access using universal [place] password for that web site. If you do not have the password, please call the hospital operator.  11/10/2020, 1:28 PM

## 2020-11-11 DIAGNOSIS — G6181 Chronic inflammatory demyelinating polyneuritis: Secondary | ICD-10-CM | POA: Diagnosis not present

## 2020-11-11 LAB — CBC
HCT: 42.8 % (ref 39.0–52.0)
Hemoglobin: 14.4 g/dL (ref 13.0–17.0)
MCH: 30.1 pg (ref 26.0–34.0)
MCHC: 33.6 g/dL (ref 30.0–36.0)
MCV: 89.5 fL (ref 80.0–100.0)
Platelets: 255 10*3/uL (ref 150–400)
RBC: 4.78 MIL/uL (ref 4.22–5.81)
RDW: 12.2 % (ref 11.5–15.5)
WBC: 7.3 10*3/uL (ref 4.0–10.5)
nRBC: 0 % (ref 0.0–0.2)

## 2020-11-11 LAB — POCT I-STAT, CHEM 8
BUN: 20 mg/dL (ref 6–20)
Calcium, Ion: 1.25 mmol/L (ref 1.15–1.40)
Chloride: 101 mmol/L (ref 98–111)
Creatinine, Ser: 0.9 mg/dL (ref 0.61–1.24)
Glucose, Bld: 177 mg/dL — ABNORMAL HIGH (ref 70–99)
HCT: 40 % (ref 39.0–52.0)
Hemoglobin: 13.6 g/dL (ref 13.0–17.0)
Potassium: 3.9 mmol/L (ref 3.5–5.1)
Sodium: 139 mmol/L (ref 135–145)
TCO2: 25 mmol/L (ref 22–32)

## 2020-11-11 LAB — BASIC METABOLIC PANEL
Anion gap: 7 (ref 5–15)
BUN: 18 mg/dL (ref 6–20)
CO2: 25 mmol/L (ref 22–32)
Calcium: 9.1 mg/dL (ref 8.9–10.3)
Chloride: 105 mmol/L (ref 98–111)
Creatinine, Ser: 0.85 mg/dL (ref 0.61–1.24)
GFR, Estimated: >60 mL/min (ref >60)
Glucose, Bld: 171 mg/dL — ABNORMAL HIGH (ref 70–99)
Potassium: 4.3 mmol/L (ref 3.5–5.1)
Sodium: 137 mmol/L (ref 135–145)

## 2020-11-11 LAB — GLUCOSE, CAPILLARY
Glucose-Capillary: 175 mg/dL — ABNORMAL HIGH (ref 70–99)
Glucose-Capillary: 198 mg/dL — ABNORMAL HIGH (ref 70–99)
Glucose-Capillary: 245 mg/dL — ABNORMAL HIGH (ref 70–99)
Glucose-Capillary: 227 mg/dL — ABNORMAL HIGH (ref 70–99)

## 2020-11-11 LAB — OLIGOCLONAL BANDS, CSF + SERM

## 2020-11-11 MED ORDER — HEPARIN SODIUM (PORCINE) 1000 UNIT/ML IJ SOLN
INTRAMUSCULAR | Status: AC
  Administered 2020-11-11: 2800 [IU]
  Filled 2020-11-11: qty 3

## 2020-11-11 MED ORDER — CALCIUM GLUCONATE-NACL 2-0.675 GM/100ML-% IV SOLN
INTRAVENOUS | Status: AC
  Administered 2020-11-11: 2000 mg via INTRAVENOUS
  Filled 2020-11-11: qty 100

## 2020-11-11 MED ORDER — ACETAMINOPHEN 325 MG PO TABS: 650.0000 mg | ORAL_TABLET | ORAL | Status: DC | PRN

## 2020-11-11 MED ORDER — SODIUM CHLORIDE 0.9 % IV SOLN
INTRAVENOUS | Status: AC
  Filled 2020-11-11 (×3): qty 200

## 2020-11-11 MED ORDER — HEPARIN SODIUM (PORCINE) 1000 UNIT/ML IJ SOLN: 1000.0000 [IU] | Freq: Once | INTRAMUSCULAR | Status: AC

## 2020-11-11 MED ORDER — ACD FORMULA A 0.73-2.45-2.2 GM/100ML VI SOLN
Status: AC
  Administered 2020-11-11: 1000 mL
  Filled 2020-11-11: qty 500

## 2020-11-11 MED ORDER — CALCIUM CARBONATE ANTACID 500 MG PO CHEW
CHEWABLE_TABLET | ORAL | Status: AC
  Administered 2020-11-11: 400 mg via ORAL
  Filled 2020-11-11: qty 4

## 2020-11-11 MED ORDER — CALCIUM GLUCONATE-NACL 2-0.675 GM/100ML-% IV SOLN
2.0000 g | INTRAVENOUS | Status: AC
  Filled 2020-11-11: qty 100

## 2020-11-11 MED ORDER — DIPHENHYDRAMINE HCL 25 MG PO CAPS: 25.0000 mg | ORAL_CAPSULE | Freq: Four times a day (QID) | ORAL | Status: DC | PRN

## 2020-11-11 MED ORDER — CALCIUM CARBONATE ANTACID 500 MG PO CHEW
2.0000 | CHEWABLE_TABLET | ORAL | Status: AC
  Administered 2020-11-11: 400 mg via ORAL
  Filled 2020-11-11: qty 2

## 2020-11-11 MED ORDER — DIPHENHYDRAMINE HCL 25 MG PO CAPS
ORAL_CAPSULE | ORAL | Status: AC
  Administered 2020-11-11: 25 mg via ORAL
  Filled 2020-11-11: qty 1

## 2020-11-11 MED ORDER — ACD FORMULA A 0.73-2.45-2.2 GM/100ML VI SOLN
1000.0000 mL | Status: DC
  Administered 2020-11-12: 1000 mL

## 2020-11-11 NOTE — Plan of Care (Signed)
  Problem: Clinical Measurements: Goal: Diagnostic test results will improve Outcome: Progressing   Problem: Activity: Goal: Risk for activity intolerance will decrease Outcome: Progressing   Problem: Coping: Goal: Level of anxiety will decrease Outcome: Progressing   

## 2020-11-11 NOTE — Progress Notes (Signed)
PROGRESS NOTE        PATIENT DETAILS Name: Tyler Brewer Age: 60 y.o. Sex: male Date of Birth: 05-Jul-1961 Admit Date: 11/04/2020 Admitting Physician Etta Quill, DO SKA:JGOTL Koren Shiver, DO  Brief Narrative: Patient is a 60 y.o. male with history of DM-2, CAD, OSA-presenting to the hospital with frequent falls-patient has had progressive bilateral lower extremity weakness since August 2021.  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  See below for further details  Significant events: 3/18>> admit for lower extremity weakness 3/25>> PLEX  started  Significant studies: 2/23>> MRI thoracic spine: No acute abnormalities noted. 3/19>> MRI lumbar sacral spine: No acute process identified. 3/19>> MRI C-spine: Small disc herniation at C3-C4, C4-C5-no abnormal cord signal. 3/19>> vitamin B12: 538 3/19>> HIV: Nonreactive 3/19>> TSH: Normal limits 3/19>> vitamin B6: Pending 3/19>> HSV 1/HSV 2 DNA by PCR blood: Negative 3/19>> CMV DNA by PCR blood>> negative 3/20>> MRI T-spine with contrast: Normal-appearing thoracic cord 3/20>> MRI C-spine: Normal-appearing cervical cord-no pathologic enhancement. 3/23>> CSF: WBC 3, protein 124 3/23>> arbovirus panel- CSF: Pending   Antimicrobial therapy: None  Microbiology data: 3/23>> CSF culture: No growth  Procedures : 3/23>> fluoroscopy-guided lumbar puncture  Consults: Neurology  DVT Prophylaxis : Prophylactic Lovenox   Subjective: No major issues overnight-has unchanged lower extremity weakness.  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  Will await further recommendations from neurology.   Peripheral diabetic nephropathy: Continue  Neurontin/Mobic and Cymbalta.  DM-2 (A1c 10.6 on 3/19): CBGs stable-continue Lantus 44 units daily, 8 units of NovoLog with meals and SSI.  Follow and adjust.  Recent Labs    11/10/20 2152 11/11/20 0659 11/11/20 1227  GLUCAP 225* 175* 198*   HTN: BP still on the higher side-amlodipine added 3/22-continue losartan.  Reassess on 3/24.  HLD: Continue statin  BPH: Continue Flomax  Hyponatremia: Appears to be mild-and chronic-continue to follow periodically.  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  Obesity: Estimated body mass index is 38.35 kg/m as calculated from the following:   Height as of this encounter: '5\' 11"'  (5.726 m).   Weight as of this encounter: 124.7 kg.    Diet: Diet Order            Diet Carb Modified Fluid consistency: Thin; Room service appropriate? Yes  Diet effective now                  Code Status: Full code   Family Communication: None at bedside-we will reach out to family over the next few days.  Disposition Plan: Status is: Inpatient  The patient will require care spanning > 2 midnights and should be moved to inpatient because: Inpatient level of care appropriate due to severity of illness  Dispo: The patient is from: Home              Anticipated d/c is to: To be determined              Patient currently is not medically stable to d/c.   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  MEDICATIONS: Scheduled Meds: . amLODipine  5 mg Oral Daily  . Chlorhexidine Gluconate Cloth  6 each Topical Daily  . DULoxetine  30 mg Oral Once per day on Mon Wed Fri  . enoxaparin (LOVENOX) injection  60 mg Subcutaneous Q24H  . gabapentin  300 mg Oral  2 times per day  . gabapentin  600 mg Oral Daily  . insulin aspart  0-15 Units Subcutaneous TID WC  . insulin aspart  8 Units Subcutaneous TID WC  . insulin glargine  44 Units Subcutaneous Daily  . losartan  100 mg Oral Daily  . multivitamin with minerals  1 tablet Oral Daily  . nystatin   Topical BID  . pravastatin  40 mg Oral q1800  . Ensure Max Protein  11 oz Oral BID  . tamsulosin  0.4 mg Oral Daily   Continuous Infusions: PRN Meds:.acetaminophen **OR** acetaminophen, diclofenac Sodium, lidocaine (PF), LORazepam, meloxicam, methocarbamol, ondansetron **OR** ondansetron (ZOFRAN) IV, polyethylene glycol, traMADol   PHYSICAL EXAM: Vital signs: Vitals:   11/11/20 1118 11/11/20 1128 11/11/20 1135 11/11/20 1224  BP: (!) 154/85 (!) 154/85 (!) 153/90 (!) 164/86  Pulse: 89 88 88 85  Resp: '17 17 19 18  ' Temp: 98.2 F (36.8 C) 98.2 F (36.8 C) 98.2 F (36.8 C) 98.4 F (36.9 C)  TempSrc:    Oral  SpO2:   100% 94%  Weight:      Height:       Filed Weights   11/04/20 1409  Weight: 124.7 kg   Body mass index is 38.35 kg/m.   Gen Exam:Alert awake-not in any distress HEENT:atraumatic, normocephalic Chest: B/L clear to auscultation anteriorly CVS:S1S2 regular Abdomen:soft non tender, non distended Extremities:no edema Neurology: Left leg-unable to lift against mild resistance-right leg able to lift against mild resistance. Skin: no rash  I have personally reviewed following labs and imaging studies  LABORATORY DATA: CBC: Recent Labs  Lab 11/11/20 0714 11/11/20 0935  WBC 7.3  --   HGB 14.4 13.6  HCT 42.8 40.0  MCV 89.5  --   PLT 255  --     Basic Metabolic Panel: Recent Labs  Lab 11/05/20 0718 11/08/20 0400 11/11/20 0714 11/11/20 0935  NA  --  134* 137 139  K  --  4.2 4.3 3.9  CL  --  104 105 101  CO2  --  24 25  --   GLUCOSE  --  174* 171* 177*  BUN  --  25* 18 20  CREATININE  --  0.97 0.85 0.90  CALCIUM  --  8.8* 9.1  --   MG 1.9 1.8  --   --      GFR: Estimated Creatinine Clearance: 121.8 mL/min (by C-G formula based on SCr of 0.9 mg/dL).  Liver Function Tests: Recent Labs  Lab 11/09/20 0938  ALBUMIN 3.3*   No results for input(s): LIPASE, AMYLASE in the last 168 hours. No results for input(s): AMMONIA in the last 168 hours.  Coagulation Profile: No results for input(s): INR, PROTIME in the last 168 hours.  Cardiac Enzymes: No results for input(s): CKTOTAL, CKMB, CKMBINDEX, TROPONINI in the last 168 hours.  BNP (last 3 results) No results for input(s): PROBNP in the last 8760 hours.  Lipid Profile: No results for input(s): CHOL, HDL, LDLCALC, TRIG, CHOLHDL, LDLDIRECT in the last 72 hours.  Thyroid  Function Tests: No results for input(s): TSH, T4TOTAL, FREET4, T3FREE, THYROIDAB in the last 72 hours.  Anemia Panel: No results for input(s): VITAMINB12, FOLATE, FERRITIN, TIBC, IRON, RETICCTPCT in the last 72 hours.  Urine analysis:    Component Value Date/Time   COLORURINE YELLOW 11/05/2020 2029   APPEARANCEUR HAZY (A) 11/05/2020 2029   LABSPEC 1.028 11/05/2020 2029   PHURINE 5.0 11/05/2020 2029   GLUCOSEU >=500 (A) 11/05/2020 2029   GLUCOSEU 100 (A) 09/25/2016 1041   HGBUR NEGATIVE 11/05/2020 2029   BILIRUBINUR NEGATIVE 11/05/2020 2029   BILIRUBINUR neg 04/26/2016 0836   KETONESUR NEGATIVE 11/05/2020 2029   PROTEINUR NEGATIVE 11/05/2020 2029   UROBILINOGEN 0.2 09/25/2016 1041   NITRITE NEGATIVE 11/05/2020 2029   LEUKOCYTESUR NEGATIVE 11/05/2020 2029    Sepsis Labs: Lactic Acid, Venous    Component Value Date/Time   LATICACIDVEN 1.3 01/21/2020 0915    MICROBIOLOGY: Recent Results (from the past 240 hour(s))  SARS CORONAVIRUS 2 (TAT 6-24 HRS) Nasopharyngeal Nasopharyngeal Swab     Status: None   Collection Time: 11/05/20  7:50 AM   Specimen: Nasopharyngeal Swab  Result Value Ref Range Status   SARS Coronavirus 2 NEGATIVE NEGATIVE Final    Comment: (NOTE) SARS-CoV-2 target nucleic acids are NOT  DETECTED.  The SARS-CoV-2 RNA is generally detectable in upper and lower respiratory specimens during the acute phase of infection. Negative results do not preclude SARS-CoV-2 infection, do not rule out co-infections with other pathogens, and should not be used as the sole basis for treatment or other patient management decisions. Negative results must be combined with clinical observations, patient history, and epidemiological information. The expected result is Negative.  Fact Sheet for Patients: SugarRoll.be  Fact Sheet for Healthcare Providers: https://www.woods-mathews.com/  This test is not yet approved or cleared by the [place] FDA and  has been authorized for detection and/or diagnosis of SARS-CoV-2 by FDA under an Emergency Use Authorization (EUA). This EUA will remain  in effect (meaning this test can be used) for the duration of the COVID-19 declaration under Se ction 564(b)(1) of the Act, 21 U.S.C. section 360bbb-3(b)(1), unless the authorization is terminated or revoked sooner.  Performed at [facility], [place] [address], [place], [place] [address]   CSF culture w Gram Stain     Status: None (Preliminary result)   Collection Time: 11/09/20  9:38 AM   Specimen: PATH Cytology CSF; Cerebrospinal Fluid  Result Value Ref Range Status   Specimen Description CSF  Final   Special Requests NONE  Final   Gram Stain   Final    WBC PRESENT, PREDOMINANTLY MONONUCLEAR NO ORGANISMS SEEN CYTOSPIN SMEAR    Culture   Final    NO GROWTH 2 DAYS Performed at [facility], [place] [address] [place], [place] [address]    Report Status PENDING  Incomplete    RADIOLOGY STUDIES/RESULTS: IR Fluoro Guide CV Line Right  Result Date: 11/10/2020 INDICATION: 60 year old male referred for temporary hemodialysis/plasmapheresis catheter placement EXAM: IMAGE GUIDED PLACEMENT OF TEMPORARY PLASMAPHERESIS CATHETER/HD CATHETER MEDICATIONS:  None ANESTHESIA/SEDATION: None. FLUOROSCOPY TIME:  Fluoroscopy Time: 0 minutes 12 seconds (10 mGy). COMPLICATIONS: None PROCEDURE: Informed written consent was obtained from the patient's family after a discussion of the risks, benefits, and alternatives to treatment. Questions regarding the procedure were encouraged and answered. The right neck was prepped with chlorhexidine in a sterile fashion, and a sterile drape was applied covering the operative field. Maximum barrier sterile technique with sterile gowns and gloves were  used for the procedure. A timeout was performed prior to the initiation of the procedure. A micropuncture kit was utilized to access the right internal jugular vein under direct, real-time ultrasound guidance after the overlying soft tissues were anesthetized with 1% lidocaine with epinephrine. Ultrasound image documentation was performed. The microwire was kinked to measure appropriate catheter length. A stiff glidewire was advanced to the level of the IVC. A 20 cm hemodialysis catheter was then placed over the wire. Final catheter positioning was confirmed and documented with a spot radiographic image. The catheter aspirates and flushes normally. The catheter was flushed with appropriate volume heparin dwells. Dressings were applied. The patient tolerated the procedure well without immediate post procedural complication. IMPRESSION: Status post right IJ temporary hemodialysis catheter. Signed, Dulcy Fanny. Dellia Nims, RPVI [facility] Radiology Electronically Signed   By: Corrie Mckusick D.O.   On: 11/10/2020 16:52   IR US Guide Vasc Access Right  Result Date: 11/10/2020 INDICATION: 60 year old male referred for temporary hemodialysis/plasmapheresis catheter placement EXAM: IMAGE GUIDED PLACEMENT OF TEMPORARY PLASMAPHERESIS CATHETER/HD CATHETER MEDICATIONS: None ANESTHESIA/SEDATION: None. FLUOROSCOPY TIME:  Fluoroscopy Time: 0 minutes 12 seconds  (10 mGy). COMPLICATIONS: None PROCEDURE: Informed written consent was obtained from the patient's family after a discussion of the risks, benefits, and alternatives to treatment. Questions regarding the procedure were encouraged and answered. The right neck was prepped with chlorhexidine in a sterile fashion, and a sterile drape was applied covering the operative field. Maximum barrier sterile technique with sterile gowns and gloves were used for the procedure. A timeout was performed prior to the initiation of the procedure. A micropuncture kit was utilized to access the right internal jugular vein under direct, real-time ultrasound guidance after the overlying soft tissues were anesthetized with 1% lidocaine with epinephrine. Ultrasound image documentation was performed. The microwire was kinked to measure appropriate catheter length. A stiff glidewire was advanced to the level of the IVC. A 20 cm hemodialysis catheter was then placed over the wire. Final catheter positioning was confirmed and documented with a spot radiographic image. The catheter aspirates and flushes normally. The catheter was flushed with appropriate volume heparin dwells. Dressings were applied. The patient tolerated the procedure well without immediate post procedural complication. IMPRESSION: Status post right IJ temporary hemodialysis catheter. Signed, Dulcy Fanny. Dellia Nims, RPVI [facility] Radiology Electronically Signed   By: Corrie Mckusick D.O.   On: 11/10/2020 16:52     LOS: 5 days   Oren Binet, MD  [facility]    To contact the attending provider between 7A-7P or the covering provider during after hours 7P-7A, please log into the web site [email or web address] and access using universal [place] password for that web site. If you do not have the password, please call the hospital operator.  11/11/2020, 2:39 PM

## 2020-11-11 NOTE — Progress Notes (Signed)
PT Cancellation Note  Patient Details Name: [name] MRN: [id number] DOB: [birth year]   Cancelled Treatment:    Reason Eval/Treat Not Completed: Patient at procedure or test/unavailable off unit for procedure. Will attempt to return if time/schedule allow.    Windell Norfolk, DPT, PN1   Supplemental Physical Therapist [facility]    Pager [phone] Acute Rehab Office [phone]

## 2020-11-11 NOTE — Progress Notes (Signed)
Attempted to see pt. Pt at dialysis.  Will attempt back as schedule allows.  Jinger Neighbors, [place] [phone]

## 2020-11-11 NOTE — Progress Notes (Signed)
[facility] consulted to obtain IV access. Patient does not currently have any IV meds or fluids ordered. He will be having plasmapheresis in Hemodialysis for next 9 days, but they should use his access for that treatment.  Sent SecureChat message to patient's nurse educating it is best practice not to place an IV that is not currently needed to decrease infection risk and allow for vein preservation. Further educated if pt's condition changes and IV access is needed emergently, IV team consult should be placed STAT with a comment as to why an emergent IV is needed.

## 2020-11-12 DIAGNOSIS — I10 Essential (primary) hypertension: Secondary | ICD-10-CM | POA: Diagnosis not present

## 2020-11-12 LAB — CBC
HCT: 39.1 % (ref 39.0–52.0)
Hemoglobin: 13.7 g/dL (ref 13.0–17.0)
MCH: 30.5 pg (ref 26.0–34.0)
MCHC: 35 g/dL (ref 30.0–36.0)
MCV: 87.1 fL (ref 80.0–100.0)
Platelets: 248 10*3/uL (ref 150–400)
RBC: 4.49 MIL/uL (ref 4.22–5.81)
RDW: 12.3 % (ref 11.5–15.5)
WBC: 9.2 10*3/uL (ref 4.0–10.5)
nRBC: 0 % (ref 0.0–0.2)

## 2020-11-12 LAB — COMPREHENSIVE METABOLIC PANEL
ALT: 19 U/L (ref 0–44)
AST: 16 U/L (ref 15–41)
Albumin: 3.5 g/dL (ref 3.5–5.0)
Alkaline Phosphatase: 55 U/L (ref 38–126)
Anion gap: 7 (ref 5–15)
BUN: 20 mg/dL (ref 6–20)
CO2: 22 mmol/L (ref 22–32)
Calcium: 8.7 mg/dL — ABNORMAL LOW (ref 8.9–10.3)
Chloride: 105 mmol/L (ref 98–111)
Creatinine, Ser: 0.85 mg/dL (ref 0.61–1.24)
GFR, Estimated: >60 mL/min (ref >60)
Glucose, Bld: 251 mg/dL — ABNORMAL HIGH (ref 70–99)
Potassium: 4.2 mmol/L (ref 3.5–5.1)
Sodium: 134 mmol/L — ABNORMAL LOW (ref 135–145)
Total Bilirubin: 0.5 mg/dL (ref 0.3–1.2)
Total Protein: 5.1 g/dL — ABNORMAL LOW (ref 6.5–8.1)

## 2020-11-12 LAB — GLUCOSE, CAPILLARY
Glucose-Capillary: 201 mg/dL — ABNORMAL HIGH (ref 70–99)
Glucose-Capillary: 191 mg/dL — ABNORMAL HIGH (ref 70–99)
Glucose-Capillary: 212 mg/dL — ABNORMAL HIGH (ref 70–99)
Glucose-Capillary: 258 mg/dL — ABNORMAL HIGH (ref 70–99)

## 2020-11-12 LAB — CSF CULTURE W GRAM STAIN: Culture: NO GROWTH

## 2020-11-12 MED ORDER — CALCIUM GLUCONATE-NACL 2-0.675 GM/100ML-% IV SOLN
INTRAVENOUS | Status: AC
  Filled 2020-11-12: qty 100

## 2020-11-12 MED ORDER — ACETAMINOPHEN 325 MG PO TABS
650.0000 mg | ORAL_TABLET | ORAL | Status: DC | PRN
  Administered 2020-11-13: 650 mg via ORAL

## 2020-11-12 MED ORDER — CALCIUM GLUCONATE-NACL 2-0.675 GM/100ML-% IV SOLN
2.0000 g | Freq: Once | INTRAVENOUS | Status: AC
  Administered 2020-11-12: 2000 mg via INTRAVENOUS
  Filled 2020-11-12: qty 100

## 2020-11-12 MED ORDER — HEPARIN SODIUM (PORCINE) 1000 UNIT/ML IJ SOLN: 1000.0000 [IU] | Freq: Once | INTRAMUSCULAR | Status: AC

## 2020-11-12 MED ORDER — HEPARIN SODIUM (PORCINE) 1000 UNIT/ML IJ SOLN
INTRAMUSCULAR | Status: AC
  Administered 2020-11-12: 4000 [IU]
  Filled 2020-11-12: qty 4

## 2020-11-12 MED ORDER — ACD FORMULA A 0.73-2.45-2.2 GM/100ML VI SOLN: 1000.0000 mL | Status: DC

## 2020-11-12 MED ORDER — SODIUM CHLORIDE 0.9 % IV SOLN
INTRAVENOUS | Status: AC
  Filled 2020-11-12 (×3): qty 200

## 2020-11-12 MED ORDER — DIPHENHYDRAMINE HCL 25 MG PO CAPS
25.0000 mg | ORAL_CAPSULE | Freq: Four times a day (QID) | ORAL | Status: DC | PRN
  Filled 2020-11-12: qty 1

## 2020-11-12 MED ORDER — CALCIUM CARBONATE ANTACID 500 MG PO CHEW
CHEWABLE_TABLET | ORAL | Status: AC
  Administered 2020-11-12: 400 mg
  Filled 2020-11-12: qty 2

## 2020-11-12 MED ORDER — CALCIUM CARBONATE ANTACID 500 MG PO CHEW
2.0000 | CHEWABLE_TABLET | ORAL | Status: AC
  Administered 2020-11-12 (×2): 400 mg via ORAL
  Filled 2020-11-12 (×2): qty 2

## 2020-11-12 MED ORDER — ACD FORMULA A 0.73-2.45-2.2 GM/100ML VI SOLN
Status: AC
  Filled 2020-11-12: qty 500

## 2020-11-12 NOTE — Progress Notes (Signed)
PROGRESS NOTE        PATIENT DETAILS Name: Tyler Brewer Age: 60 y.o. Sex: male Date of Birth: November 18, 1960 Admit Date: 11/04/2020 Admitting Physician Etta Quill, DO TWS:FKCLE Koren Shiver, DO  Brief Narrative: Patient is a 60 y.o. male with history of DM-2, CAD, OSA-presenting to the hospital with frequent falls-patient has had progressive bilateral lower extremity weakness since August 2021.  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  See below for further details  Significant events: 3/18>> admit for lower extremity weakness 3/25>> PLEX  started  Significant studies: 2/23>> MRI thoracic spine: No acute abnormalities noted. 3/19>> MRI lumbar sacral spine: No acute process identified. 3/19>> MRI C-spine: Small disc herniation at C3-C4, C4-C5-no abnormal cord signal. 3/19>> vitamin B12: 538 3/19>> HIV: Nonreactive 3/19>> TSH: Normal limits 3/19>> vitamin B6: Pending 3/19>> HSV 1/HSV 2 DNA by PCR blood: Negative 3/19>> CMV DNA by PCR blood>> negative 3/20>> MRI T-spine with contrast: Normal-appearing thoracic cord 3/20>> MRI C-spine: Normal-appearing cervical cord-no pathologic enhancement. 3/23>> CSF: WBC 3, protein 124 3/23>> arbovirus panel- CSF: Pending   Antimicrobial therapy: None  Microbiology data: 3/23>> CSF culture: No growth  Procedures : 3/23>> fluoroscopy-guided lumbar puncture  Consults: Neurology  DVT Prophylaxis : Prophylactic Lovenox   Subjective:  Patient in bed, appears comfortable, denies any headache, no fever, no chest pain or pressure, no shortness of breath , no abdominal pain.  Continues to have diffuse weakness worse in the lower extremities L>>R, upper extremities relatively mildly weaker.  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  Continue PT-OT May require placement.   Peripheral diabetic nephropathy: Continue Neurontin/Mobic and Cymbalta.  HTN: BP still on the higher side-amlodipine added 3/22-continue losartan.  Reassess on 3/24.  HLD: Continue statin  BPH: Continue Flomax  Hyponatremia: Appears to be mild-and chronic-continue to follow periodically.  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  Obesity: Estimated body mass index is 38.35 kg/m as calculated from the following:   Height as of this encounter: '5\' 11"'  (1.803 m).   Weight as of this encounter: 124.7 kg.   DM-2 (A1c 10.6 on 3/19): On Lantus along with premeal NovoLog and sliding scale, monitor and adjust.  Poor outpatient control due to hyperglycemia.  CBG (last 3)  Recent Labs    11/11/20 1640 11/11/20 2150 11/12/20 0638  GLUCAP 245* 227* 201*     Diet: Diet Order            Diet Carb Modified Fluid consistency: Thin; Room service appropriate? Yes  Diet effective now                  Code Status: Full code   Family Communication:  Mother [name] [phone] on 11/12/20  Disposition Plan: Status is: Inpatient  The patient will require care spanning > 2 midnights and should be moved to inpatient because: Inpatient level of care appropriate due to severity of illness  Dispo: The patient is from: Home              Anticipated d/c is to: To be determined              Patient  currently is not medically stable to d/c.   Difficult to place patient No    Barriers to Discharge: CIDP-starting PLEX 3/25  Antimicrobial agents: Anti-infectives (From admission, onward)   None       Time spent: 25- minutes-Greater than 50% of this time was spent in counseling, explanation of diagnosis, planning of further management, and coordination of care.  MEDICATIONS: Scheduled Meds: . amLODipine  5 mg Oral Daily  . calcium  carbonate  2 tablet Oral Q3H  . Chlorhexidine Gluconate Cloth  6 each Topical Daily  . DULoxetine  30 mg Oral Once per day on Mon Wed Fri  . enoxaparin (LOVENOX) injection  60 mg Subcutaneous Q24H  . gabapentin  300 mg Oral 2 times per day  . gabapentin  600 mg Oral Daily  . heparin sodium (porcine)  1,000 Units Intracatheter Once  . insulin aspart  0-15 Units Subcutaneous TID WC  . insulin aspart  8 Units Subcutaneous TID WC  . insulin glargine  44 Units Subcutaneous Daily  . losartan  100 mg Oral Daily  . multivitamin with minerals  1 tablet Oral Daily  . nystatin   Topical BID  . pravastatin  40 mg Oral q1800  . Ensure Max Protein  11 oz Oral BID  . tamsulosin  0.4 mg Oral Daily   Continuous Infusions: . calcium gluconate 2,000 mg (11/12/20 0910)  . calcium gluconate    . citrate dextrose    . citrate dextrose     PRN Meds:.acetaminophen **OR** acetaminophen, acetaminophen, diclofenac Sodium, diphenhydrAMINE, lidocaine (PF), LORazepam, meloxicam, methocarbamol, [DISCONTINUED] ondansetron **OR** ondansetron (ZOFRAN) IV, polyethylene glycol, traMADol   PHYSICAL EXAM: Vital signs: Vitals:   11/12/20 0850 11/12/20 0900 11/12/20 0915 11/12/20 0919  BP: (!) 164/90 (!) 154/88 (!) 163/96 (!) 158/91  Pulse: 82 80 92 91  Resp: '16 16 15 15  ' Temp:    (!) 97.2 F (36.2 C)  TempSrc:    Oral  SpO2:    95%  Weight:      Height:       Filed Weights   11/04/20 1409  Weight: 124.7 kg   Body mass index is 38.35 kg/m.   Gen Exam:  Awake Alert, strength in upper extremities 5/5, right lower extremity 4/5, left lower leg 3/5 [place].AT,PERRAL Supple Neck,No JVD, No cervical lymphadenopathy appriciated.  Symmetrical Chest wall movement, Good air movement bilaterally, CTAB RRR,No Gallops, Rubs or new Murmurs, No Parasternal Heave +ve B.Sounds, Abd Soft, No tenderness, No organomegaly appriciated, No rebound - guarding or rigidity. No Cyanosis, Clubbing or edema, No new Rash or  bruise   I have personally reviewed following labs and imaging studies  LABORATORY DATA: CBC: Recent Labs  Lab 11/11/20 0714 11/11/20 0935 11/12/20 0346  WBC 7.3  --  9.2  HGB 14.4 13.6 13.7  HCT 42.8 40.0 39.1  MCV 89.5  --  87.1  PLT 255  --  709    Basic Metabolic Panel: Recent Labs  Lab 11/08/20 0400 11/11/20 0714 11/11/20 0935 11/12/20 0219  NA 134* 137 139 134*  K 4.2 4.3 3.9 4.2  CL 104 105 101 105  CO2 24 25  --  22  GLUCOSE 174* 171* 177* 251*  BUN 25* '18 20 20  ' CREATININE 0.97 0.85 0.90 0.85  CALCIUM 8.8* 9.1  --  8.7*  MG 1.8  --   --   --     GFR: Estimated Creatinine Clearance: 129 mL/min (by C-G formula based on SCr of  0.85 mg/dL).  Liver Function Tests: Recent Labs  Lab 11/09/20 0938 11/12/20 0219  AST  --  16  ALT  --  19  ALKPHOS  --  55  BILITOT  --  0.5  PROT  --  5.1*  ALBUMIN 3.3* 3.5   No results for input(s): LIPASE, AMYLASE in the last 168 hours. No results for input(s): AMMONIA in the last 168 hours.  Coagulation Profile: No results for input(s): INR, PROTIME in the last 168 hours.  Cardiac Enzymes: No results for input(s): CKTOTAL, CKMB, CKMBINDEX, TROPONINI in the last 168 hours.  BNP (last 3 results) No results for input(s): PROBNP in the last 8760 hours.  Lipid Profile: No results for input(s): CHOL, HDL, LDLCALC, TRIG, CHOLHDL, LDLDIRECT in the last 72 hours.  Thyroid Function Tests: No results for input(s): TSH, T4TOTAL, FREET4, T3FREE, THYROIDAB in the last 72 hours.  Anemia Panel: No results for input(s): VITAMINB12, FOLATE, FERRITIN, TIBC, IRON, RETICCTPCT in the last 72 hours.  Urine analysis:    Component Value Date/Time   COLORURINE YELLOW 11/05/2020 2029   APPEARANCEUR HAZY (A) 11/05/2020 2029   LABSPEC 1.028 11/05/2020 2029   PHURINE 5.0 11/05/2020 2029   GLUCOSEU >=500 (A) 11/05/2020 2029   GLUCOSEU 100 (A) 09/25/2016 1041   HGBUR NEGATIVE 11/05/2020 2029   BILIRUBINUR NEGATIVE 11/05/2020 2029    BILIRUBINUR neg 04/26/2016 0836   KETONESUR NEGATIVE 11/05/2020 2029   PROTEINUR NEGATIVE 11/05/2020 2029   UROBILINOGEN 0.2 09/25/2016 1041   NITRITE NEGATIVE 11/05/2020 2029   LEUKOCYTESUR NEGATIVE 11/05/2020 2029    Sepsis Labs: Lactic Acid, Venous    Component Value Date/Time   LATICACIDVEN 1.3 01/21/2020 0915    MICROBIOLOGY: Recent Results (from the past 240 hour(s))  SARS CORONAVIRUS 2 (TAT 6-24 HRS) Nasopharyngeal Nasopharyngeal Swab     Status: None   Collection Time: 11/05/20  7:50 AM   Specimen: Nasopharyngeal Swab  Result Value Ref Range Status   SARS Coronavirus 2 NEGATIVE NEGATIVE Final    Comment: (NOTE) SARS-CoV-2 target nucleic acids are NOT DETECTED.  The SARS-CoV-2 RNA is generally detectable in upper and lower respiratory specimens during the acute phase of infection. Negative results do not preclude SARS-CoV-2 infection, do not rule out co-infections with other pathogens, and should not be used as the sole basis for treatment or other patient management decisions. Negative results must be combined with clinical observations, patient history, and epidemiological information. The expected result is Negative.  Fact Sheet for Patients: SugarRoll.be  Fact Sheet for Healthcare Providers: https://www.woods-mathews.com/  This test is not yet approved or cleared by the [place] FDA and  has been authorized for detection and/or diagnosis of SARS-CoV-2 by FDA under an Emergency Use Authorization (EUA). This EUA will remain  in effect (meaning this test can be used) for the duration of the COVID-19 declaration under Se ction 564(b)(1) of the Act, 21 U.S.C. section 360bbb-3(b)(1), unless the authorization is terminated or revoked sooner.  Performed at [facility], [place] [address] [place], [place] [address]   CSF culture w Gram Stain     Status: None (Preliminary result)   Collection Time: 11/09/20  9:38  AM   Specimen: PATH Cytology CSF; Cerebrospinal Fluid  Result Value Ref Range Status   Specimen Description CSF  Final   Special Requests NONE  Final   Gram Stain   Final    WBC PRESENT, PREDOMINANTLY MONONUCLEAR NO ORGANISMS SEEN CYTOSPIN SMEAR    Culture   Final  NO GROWTH 2 DAYS Performed at [facility], [place] [address] [place], [place] [address]    Report Status PENDING  Incomplete    RADIOLOGY STUDIES/RESULTS: IR Fluoro Guide CV Line Right  Result Date: 11/10/2020 INDICATION: 60 year old male referred for temporary hemodialysis/plasmapheresis catheter placement EXAM: IMAGE GUIDED PLACEMENT OF TEMPORARY PLASMAPHERESIS CATHETER/HD CATHETER MEDICATIONS: None ANESTHESIA/SEDATION: None. FLUOROSCOPY TIME:  Fluoroscopy Time: 0 minutes 12 seconds (10 mGy). COMPLICATIONS: None PROCEDURE: Informed written consent was obtained from the patient's family after a discussion of the risks, benefits, and alternatives to treatment. Questions regarding the procedure were encouraged and answered. The right neck was prepped with chlorhexidine in a sterile fashion, and a sterile drape was applied covering the operative field. Maximum barrier sterile technique with sterile gowns and gloves were used for the procedure. A timeout was performed prior to the initiation of the procedure. A micropuncture kit was utilized to access the right internal jugular vein under direct, real-time ultrasound guidance after the overlying soft tissues were anesthetized with 1% lidocaine with epinephrine. Ultrasound image documentation was performed. The microwire was kinked to measure appropriate catheter length. A stiff glidewire was advanced to the level of the IVC. A 20 cm hemodialysis catheter was then placed over the wire. Final catheter positioning was confirmed and documented with a spot radiographic image. The catheter aspirates and flushes normally. The catheter was flushed with appropriate volume heparin dwells.  Dressings were applied. The patient tolerated the procedure well without immediate post procedural complication. IMPRESSION: Status post right IJ temporary hemodialysis catheter. Signed, Dulcy Fanny. Dellia Nims, RPVI [facility] Electronically Signed   By: Corrie Mckusick D.O.   On: 11/10/2020 16:52   IR US Guide Vasc Access Right  Result Date: 11/10/2020 INDICATION: 60 year old male referred for temporary hemodialysis/plasmapheresis catheter placement EXAM: IMAGE GUIDED PLACEMENT OF TEMPORARY PLASMAPHERESIS CATHETER/HD CATHETER MEDICATIONS: None ANESTHESIA/SEDATION: None. FLUOROSCOPY TIME:  Fluoroscopy Time: 0 minutes 12 seconds (10 mGy). COMPLICATIONS: None PROCEDURE: Informed written consent was obtained from the patient's family after a discussion of the risks, benefits, and alternatives to treatment. Questions regarding the procedure were encouraged and answered. The right neck was prepped with chlorhexidine in a sterile fashion, and a sterile drape was applied covering the operative field. Maximum barrier sterile technique with sterile gowns and gloves were used for the procedure. A timeout was performed prior to the initiation of the procedure. A micropuncture kit was utilized to access the right internal jugular vein under direct, real-time ultrasound guidance after the overlying soft tissues were anesthetized with 1% lidocaine with epinephrine. Ultrasound image documentation was performed. The microwire was kinked to measure appropriate catheter length. A stiff glidewire was advanced to the level of the IVC. A 20 cm hemodialysis catheter was then placed over the wire. Final catheter positioning was confirmed and documented with a spot radiographic image. The catheter aspirates and flushes normally. The catheter was flushed with appropriate volume heparin dwells. Dressings were applied. The patient tolerated the procedure well without immediate post  procedural complication. IMPRESSION: Status post right IJ temporary hemodialysis catheter. Signed, Dulcy Fanny. Dellia Nims, RPVI [facility] Electronically Signed   By: Corrie Mckusick D.O.   On: 11/10/2020 16:52     LOS: 6 days   Lala Lund, MD  [facility]  11/12/2020, 9:30 AM

## 2020-11-13 DIAGNOSIS — I10 Essential (primary) hypertension: Secondary | ICD-10-CM | POA: Diagnosis not present

## 2020-11-13 LAB — COMPREHENSIVE METABOLIC PANEL
ALT: 16 U/L (ref 0–44)
AST: 11 U/L — ABNORMAL LOW (ref 15–41)
Albumin: 3.7 g/dL (ref 3.5–5.0)
Alkaline Phosphatase: 45 U/L (ref 38–126)
Anion gap: 4 — ABNORMAL LOW (ref 5–15)
BUN: 16 mg/dL (ref 6–20)
CO2: 26 mmol/L (ref 22–32)
Calcium: 9.1 mg/dL (ref 8.9–10.3)
Chloride: 105 mmol/L (ref 98–111)
Creatinine, Ser: 0.88 mg/dL (ref 0.61–1.24)
GFR, Estimated: >60 mL/min (ref >60)
Glucose, Bld: 309 mg/dL — ABNORMAL HIGH (ref 70–99)
Potassium: 4.3 mmol/L (ref 3.5–5.1)
Sodium: 135 mmol/L (ref 135–145)
Total Bilirubin: 0.7 mg/dL (ref 0.3–1.2)
Total Protein: 5 g/dL — ABNORMAL LOW (ref 6.5–8.1)

## 2020-11-13 LAB — CBC WITH DIFFERENTIAL/PLATELET
Abs Immature Granulocytes: 0.05 10*3/uL (ref 0.00–0.07)
Basophils Absolute: 0.1 10*3/uL (ref 0.0–0.1)
Basophils Relative: 1 %
Eosinophils Absolute: 0.3 10*3/uL (ref 0.0–0.5)
Eosinophils Relative: 3 %
HCT: 39.2 % (ref 39.0–52.0)
Hemoglobin: 13.4 g/dL (ref 13.0–17.0)
Immature Granulocytes: 1 %
Lymphocytes Relative: 21 %
Lymphs Abs: 1.7 10*3/uL (ref 0.7–4.0)
MCH: 30.5 pg (ref 26.0–34.0)
MCHC: 34.2 g/dL (ref 30.0–36.0)
MCV: 89.1 fL (ref 80.0–100.0)
Monocytes Absolute: 0.8 10*3/uL (ref 0.1–1.0)
Monocytes Relative: 10 %
Neutro Abs: 5.2 10*3/uL (ref 1.7–7.7)
Neutrophils Relative %: 64 %
Platelets: 211 10*3/uL (ref 150–400)
RBC: 4.4 MIL/uL (ref 4.22–5.81)
RDW: 12.4 % (ref 11.5–15.5)
WBC: 8.1 10*3/uL (ref 4.0–10.5)
nRBC: 0 % (ref 0.0–0.2)

## 2020-11-13 LAB — GLUCOSE, CAPILLARY
Glucose-Capillary: 237 mg/dL — ABNORMAL HIGH (ref 70–99)
Glucose-Capillary: 184 mg/dL — ABNORMAL HIGH (ref 70–99)
Glucose-Capillary: 159 mg/dL — ABNORMAL HIGH (ref 70–99)
Glucose-Capillary: 219 mg/dL — ABNORMAL HIGH (ref 70–99)

## 2020-11-13 LAB — MAGNESIUM: Magnesium: 1.7 mg/dL (ref 1.7–2.4)

## 2020-11-13 MED ORDER — INSULIN GLARGINE 100 UNIT/ML ~~LOC~~ SOLN
7.0000 [IU] | Freq: Once | SUBCUTANEOUS | Status: AC
  Administered 2020-11-13: 7 [IU] via SUBCUTANEOUS
  Filled 2020-11-13: qty 0.07

## 2020-11-13 MED ORDER — FLUCONAZOLE 100 MG PO TABS
100.0000 mg | ORAL_TABLET | Freq: Every day | ORAL | Status: AC
  Administered 2020-11-13 – 2020-11-19 (×7): 100 mg via ORAL
  Filled 2020-11-13 (×7): qty 1

## 2020-11-13 MED ORDER — INSULIN GLARGINE 100 UNIT/ML ~~LOC~~ SOLN
50.0000 [IU] | Freq: Every day | SUBCUTANEOUS | Status: DC
  Administered 2020-11-14 – 2020-11-21 (×8): 50 [IU] via SUBCUTANEOUS
  Filled 2020-11-13 (×8): qty 0.5

## 2020-11-13 MED ORDER — HYDRALAZINE HCL 50 MG PO TABS
50.0000 mg | ORAL_TABLET | Freq: Three times a day (TID) | ORAL | Status: DC
  Administered 2020-11-13 – 2020-11-21 (×18): 50 mg via ORAL
  Filled 2020-11-13 (×18): qty 1

## 2020-11-13 MED ORDER — VITAMIN B-6 100 MG PO TABS
100.0000 mg | ORAL_TABLET | Freq: Every day | ORAL | Status: AC
  Administered 2020-11-13 – 2020-11-17 (×5): 100 mg via ORAL
  Filled 2020-11-13 (×5): qty 1
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
## 2020-11-13 NOTE — Progress Notes (Incomplete)
Patient taking a shower. RN educated patient about safety and infection risk, patient said that he understood but was still taking a shower.

## 2020-11-13 NOTE — Progress Notes (Signed)
Benefits check for [employer] submitted but wont result until tomorrow. TOC following.

## 2020-11-13 NOTE — Progress Notes (Signed)
PROGRESS NOTE        PATIENT DETAILS Name: Tyler Brewer Age: 60 y.o. Sex: male Date of Birth: 1961-04-10 Admit Date: 11/04/2020 Admitting Physician Etta Quill, DO MWU:[id number] Koren Shiver, DO  Brief Narrative: Patient is a 60 y.o. male with history of DM-2, CAD, OSA-presenting to the hospital with frequent falls-patient has had progressive bilateral lower extremity weakness since August 2021.  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  See below for further details  Significant events: 3/18>> admit for lower extremity weakness 3/25>> PLEX  started  Significant studies: 2/23>> MRI thoracic spine: No acute abnormalities noted. 3/19>> MRI lumbar sacral spine: No acute process identified. 3/19>> MRI C-spine: Small disc herniation at C3-C4, C4-C5-no abnormal cord signal. 3/19>> vitamin B12: 538 3/19>> HIV: Nonreactive 3/19>> TSH: Normal limits 3/19>> vitamin B6: Pending 3/19>> HSV 1/HSV 2 DNA by PCR blood: Negative 3/19>> CMV DNA by PCR blood>> negative 3/20>> MRI T-spine with contrast: Normal-appearing thoracic cord 3/20>> MRI C-spine: Normal-appearing cervical cord-no pathologic enhancement. 3/23>> CSF: WBC 3, protein 124 3/23>> arbovirus panel- CSF: Pending   Antimicrobial therapy: None  Microbiology data: 3/23>> CSF culture: No growth  Procedures : 3/23>> fluoroscopy-guided lumbar puncture  Consults: Neurology  DVT Prophylaxis : Prophylactic Lovenox   Subjective:  Patient in bed, appears comfortable, denies any headache, no fever, no chest pain or pressure, no shortness of breath , no abdominal pain.  Continues to have diffuse weakness worse in the left lower extremity, thinks there is mild improvement over the last 2 days.   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  Continue PT-OT May require placement.   Peripheral diabetic nephropathy: Continue Neurontin/Mobic and Cymbalta.  HTN: On combination of losartan, Norvasc, will add hydralazine for better control.  HLD: Continue statin  BPH: Continue Flomax  Hyponatremia: Appears to be mild-and chronic-continue to follow periodically.  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  Obesity: Estimated body mass index is 40.74 kg/m as calculated from the following:   Height as of this encounter: 5\' 11"  (1.803 m).   Weight as of this encounter: 132.5 kg.   DM-2 (A1c 10.6 on 3/19): On Lantus along with premeal NovoLog and sliding scale, Lantus increased for better control on 11/13/2020.  Poor outpatient control due to hyperglycemia.  CBG (last 3)  Recent Labs    11/12/20 1651 11/12/20 2208 11/13/20 0709  GLUCAP 212* 258* 237*     Diet: Diet Order            Diet Carb Modified Fluid consistency: Thin; Room service appropriate? Yes  Diet effective now                  Code Status: Full code   Family Communication:  Mother [name] [phone] on 11/12/20  Disposition Plan: Status is: Inpatient  The patient will require care spanning > 2 midnights and should be moved to inpatient because: Inpatient level of care appropriate due to severity of illness  Dispo: The patient is from: Home              Anticipated d/c is to: To be determined  Patient currently is not medically stable to d/c.   Difficult to place patient No    Barriers to Discharge: CIDP-starting PLEX 3/25  Antimicrobial agents: Anti-infectives (From admission, onward)   Start     Dose/Rate Route Frequency Ordered Stop   11/13/20 1030  fluconazole (DIFLUCAN) tablet 100 mg        100 mg Oral Daily 11/13/20 0941 11/20/20 0959       Time spent: 25- minutes-Greater than 50%  of this time was spent in counseling, explanation of diagnosis, planning of further management, and coordination of care.  MEDICATIONS: Scheduled Meds: . amLODipine  5 mg Oral Daily  . Chlorhexidine Gluconate Cloth  6 each Topical Daily  . DULoxetine  30 mg Oral Once per day on Mon Wed Fri  . enoxaparin (LOVENOX) injection  60 mg Subcutaneous Q24H  . fluconazole  100 mg Oral Daily  . gabapentin  300 mg Oral 2 times per day  . gabapentin  600 mg Oral Daily  . heparin sodium (porcine)  1,000 Units Intracatheter Once  . insulin aspart  0-15 Units Subcutaneous TID WC  . insulin aspart  8 Units Subcutaneous TID WC  . insulin glargine  44 Units Subcutaneous Daily  . losartan  100 mg Oral Daily  . multivitamin with minerals  1 tablet Oral Daily  . nystatin   Topical BID  . pravastatin  40 mg Oral q1800  . Ensure Max Protein  11 oz Oral BID  . vitamin B-6  100 mg Oral Daily  . tamsulosin  0.4 mg Oral Daily   Continuous Infusions: . citrate dextrose     PRN Meds:.acetaminophen **OR** acetaminophen, acetaminophen, diclofenac Sodium, diphenhydrAMINE, lidocaine (PF), LORazepam, meloxicam, methocarbamol, [DISCONTINUED] ondansetron **OR** ondansetron (ZOFRAN) IV, polyethylene glycol, traMADol   PHYSICAL EXAM: Vital signs: Vitals:   11/12/20 1653 11/12/20 2015 11/12/20 2016 11/13/20 0429  BP: (!) 145/83 (!) 143/83  (!) 151/75  Pulse: 86 84  95  Resp: 18 20  20   Temp: 98.1 F (36.7 C) 98.5 F (36.9 C)  98.2 F (36.8 C)  TempSrc: Oral   Oral  SpO2: 97% 100%  94%  Weight:   132.5 kg   Height:       Filed Weights   11/04/20 1409 11/12/20 2016  Weight: 124.7 kg 132.5 kg   Body mass index is 40.74 kg/m.   Gen Exam:  Awake Alert, strength in upper extremities 5/5, right lower extremity 4/5, left lower leg 3/5 [place].AT,PERRAL Supple Neck,No JVD, No cervical lymphadenopathy appriciated.  Symmetrical Chest wall movement, Good air movement bilaterally, CTAB RRR,No Gallops, Rubs or new  Murmurs, No Parasternal Heave +ve B.Sounds, Abd Soft, No tenderness, No organomegaly appriciated, No rebound - guarding or rigidity. No Cyanosis, Clubbing or edema, No new Rash or bruise    I have personally reviewed following labs and imaging studies  LABORATORY DATA: CBC: Recent Labs  Lab 11/11/20 0714 11/11/20 0935 11/12/20 0346 11/13/20 0250  WBC 7.3  --  9.2 8.1  NEUTROABS  --   --   --  5.2  HGB 14.4 13.6 13.7 13.4  HCT 42.8 40.0 39.1 39.2  MCV 89.5  --  87.1 89.1  PLT 255  --  248 453    Basic Metabolic Panel: Recent Labs  Lab 11/08/20 0400 11/11/20 0714 11/11/20 0935 11/12/20 0219 11/13/20 0250  NA 134* 137 139 134* 135  K 4.2 4.3 3.9 4.2 4.3  CL 104 105 101 105 105  CO2 24 25  --  22 26  GLUCOSE 174* 171* 177* 251* 309*  BUN 25* 18 20 20 16   CREATININE 0.97 0.85 0.90 0.85 0.88  CALCIUM 8.8* 9.1  --  8.7* 9.1  MG 1.8  --   --   --  1.7    GFR: Estimated Creatinine Clearance: 128.6 mL/min (by C-G formula based on SCr of 0.88 mg/dL).  Liver Function Tests: Recent Labs  Lab 11/09/20 0938 11/12/20 0219 11/13/20 0250  AST  --  16 11*  ALT  --  19 16  ALKPHOS  --  55 45  BILITOT  --  0.5 0.7  PROT  --  5.1* 5.0*  ALBUMIN 3.3* 3.5 3.7   No results for input(s): LIPASE, AMYLASE in the last 168 hours. No results for input(s): AMMONIA in the last 168 hours.  Coagulation Profile: No results for input(s): INR, PROTIME in the last 168 hours.  Cardiac Enzymes: No results for input(s): CKTOTAL, CKMB, CKMBINDEX, TROPONINI in the last 168 hours.  BNP (last 3 results) No results for input(s): PROBNP in the last 8760 hours.  Lipid Profile: No results for input(s): CHOL, HDL, LDLCALC, TRIG, CHOLHDL, LDLDIRECT in the last 72 hours.  Thyroid Function Tests: No results for input(s): TSH, T4TOTAL, FREET4, T3FREE, THYROIDAB in the last 72 hours.  Anemia Panel: No results for input(s): VITAMINB12, FOLATE, FERRITIN, TIBC, IRON, RETICCTPCT in the last 72  hours.  Urine analysis:    Component Value Date/Time   COLORURINE YELLOW 11/05/2020 2029   APPEARANCEUR HAZY (A) 11/05/2020 2029   LABSPEC 1.028 11/05/2020 2029   PHURINE 5.0 11/05/2020 2029   GLUCOSEU >=500 (A) 11/05/2020 2029   GLUCOSEU 100 (A) 09/25/2016 1041   HGBUR NEGATIVE 11/05/2020 2029   BILIRUBINUR NEGATIVE 11/05/2020 2029   BILIRUBINUR neg 04/26/2016 0836   KETONESUR NEGATIVE 11/05/2020 2029   PROTEINUR NEGATIVE 11/05/2020 2029   UROBILINOGEN 0.2 09/25/2016 1041   NITRITE NEGATIVE 11/05/2020 2029   LEUKOCYTESUR NEGATIVE 11/05/2020 2029    Sepsis Labs: Lactic Acid, Venous    Component Value Date/Time   LATICACIDVEN 1.3 01/21/2020 0915    MICROBIOLOGY: Recent Results (from the past 240 hour(s))  SARS CORONAVIRUS 2 (TAT 6-24 HRS) Nasopharyngeal Nasopharyngeal Swab     Status: None   Collection Time: 11/05/20  7:50 AM   Specimen: Nasopharyngeal Swab  Result Value Ref Range Status   SARS Coronavirus 2 NEGATIVE NEGATIVE Final    Comment: (NOTE) SARS-CoV-2 target nucleic acids are NOT DETECTED.  The SARS-CoV-2 RNA is generally detectable in upper and lower respiratory specimens during the acute phase of infection. Negative results do not preclude SARS-CoV-2 infection, do not rule out co-infections with other pathogens, and should not be used as the sole basis for treatment or other patient management decisions. Negative results must be combined with clinical observations, patient history, and epidemiological information. The expected result is Negative.  Fact Sheet for Patients: SugarRoll.be  Fact Sheet for Healthcare Providers: https://www.woods-mathews.com/  This test is not yet approved or cleared by the [place] FDA and  has been authorized for detection and/or diagnosis of SARS-CoV-2 by FDA under an Emergency Use Authorization (EUA). This EUA will remain  in effect (meaning this test can be used) for the  duration of the COVID-19 declaration under Se ction 564(b)(1) of the Act, 21 U.S.C. section 360bbb-3(b)(1), unless the authorization is terminated or revoked sooner.  Performed at [facility], [place] [address], [place], [place] [address]   CSF culture w Gram Stain  Status: None   Collection Time: 11/09/20  9:38 AM   Specimen: PATH Cytology CSF; Cerebrospinal Fluid  Result Value Ref Range Status   Specimen Description CSF  Final   Special Requests NONE  Final   Gram Stain   Final    WBC PRESENT, PREDOMINANTLY MONONUCLEAR NO ORGANISMS SEEN CYTOSPIN SMEAR    Culture   Final    NO GROWTH Performed at [facility], [place] [address] [place], [place] [address]    Report Status 11/12/2020 FINAL  Final    RADIOLOGY STUDIES/RESULTS: No results found.   LOS: 7 days   Lala Lund, MD  [facility]  11/13/2020, 9:43 AM

## 2020-11-13 NOTE — Progress Notes (Signed)
RN messaged IV team to have pt's central line checked due to patient getting in the shower without this RN's knowledge and the dressing was coming off. RN reinforced dressing with tape and noticed that the disc looked a little puffy. Pt refused to have IV team look at disc. Pt insisted that dressing did not get wet and that he just wanted more tape on it. IV team stated if pt changed his mind that they would come back.   Eleanora Neighbor, RN

## 2020-11-14 ENCOUNTER — Inpatient Hospital Stay (HOSPITAL_COMMUNITY): Payer: Medicare HMO

## 2020-11-14 DIAGNOSIS — I10 Essential (primary) hypertension: Secondary | ICD-10-CM | POA: Diagnosis not present

## 2020-11-14 LAB — COMPREHENSIVE METABOLIC PANEL
ALT: 19 U/L (ref 0–44)
AST: 13 U/L — ABNORMAL LOW (ref 15–41)
Albumin: 3.5 g/dL (ref 3.5–5.0)
Alkaline Phosphatase: 46 U/L (ref 38–126)
Anion gap: 6 (ref 5–15)
BUN: 17 mg/dL (ref 6–20)
CO2: 23 mmol/L (ref 22–32)
Calcium: 8.9 mg/dL (ref 8.9–10.3)
Chloride: 104 mmol/L (ref 98–111)
Creatinine, Ser: 0.83 mg/dL (ref 0.61–1.24)
GFR, Estimated: >60 mL/min (ref >60)
Glucose, Bld: 250 mg/dL — ABNORMAL HIGH (ref 70–99)
Potassium: 4 mmol/L (ref 3.5–5.1)
Sodium: 133 mmol/L — ABNORMAL LOW (ref 135–145)
Total Bilirubin: 0.2 mg/dL — ABNORMAL LOW (ref 0.3–1.2)
Total Protein: 5.2 g/dL — ABNORMAL LOW (ref 6.5–8.1)

## 2020-11-14 LAB — CBC WITH DIFFERENTIAL/PLATELET
Abs Immature Granulocytes: 0.06 10*3/uL (ref 0.00–0.07)
Basophils Absolute: 0.1 10*3/uL (ref 0.0–0.1)
Basophils Relative: 1 %
Eosinophils Absolute: 0.3 10*3/uL (ref 0.0–0.5)
Eosinophils Relative: 3 %
HCT: 40.2 % (ref 39.0–52.0)
Hemoglobin: 13.1 g/dL (ref 13.0–17.0)
Immature Granulocytes: 1 %
Lymphocytes Relative: 24 %
Lymphs Abs: 2 10*3/uL (ref 0.7–4.0)
MCH: 29.8 pg (ref 26.0–34.0)
MCHC: 32.6 g/dL (ref 30.0–36.0)
MCV: 91.6 fL (ref 80.0–100.0)
Monocytes Absolute: 1 10*3/uL (ref 0.1–1.0)
Monocytes Relative: 12 %
Neutro Abs: 4.8 10*3/uL (ref 1.7–7.7)
Neutrophils Relative %: 59 %
Platelets: 204 10*3/uL (ref 150–400)
RBC: 4.39 MIL/uL (ref 4.22–5.81)
RDW: 12.5 % (ref 11.5–15.5)
WBC: 8.2 10*3/uL (ref 4.0–10.5)
nRBC: 0 % (ref 0.0–0.2)

## 2020-11-14 LAB — MAGNESIUM: Magnesium: 1.8 mg/dL (ref 1.7–2.4)

## 2020-11-14 LAB — GLUCOSE, CAPILLARY
Glucose-Capillary: 192 mg/dL — ABNORMAL HIGH (ref 70–99)
Glucose-Capillary: 221 mg/dL — ABNORMAL HIGH (ref 70–99)
Glucose-Capillary: 178 mg/dL — ABNORMAL HIGH (ref 70–99)

## 2020-11-14 LAB — BASIC METABOLIC PANEL
Anion gap: 7 (ref 5–15)
BUN: 17 mg/dL (ref 6–20)
CO2: 24 mmol/L (ref 22–32)
Calcium: 8.9 mg/dL (ref 8.9–10.3)
Chloride: 105 mmol/L (ref 98–111)
Creatinine, Ser: 0.85 mg/dL (ref 0.61–1.24)
GFR, Estimated: >60 mL/min (ref >60)
Glucose, Bld: 232 mg/dL — ABNORMAL HIGH (ref 70–99)
Potassium: 3.9 mmol/L (ref 3.5–5.1)
Sodium: 136 mmol/L (ref 135–145)

## 2020-11-14 LAB — PROCALCITONIN: Procalcitonin: <0.1 ng/mL

## 2020-11-14 IMAGING — US US EXTREM UP *R* LTD
1 series · 9 of 9 positions shown · non-contrast
Comparison: None.

CLINICAL DATA: Right forearm swelling and redness in the area of
recent IV access.

EXAM:
ULTRASOUND RIGHT UPPER EXTREMITY LIMITED
TECHNIQUE: Ultrasound examination of the upper extremity soft tissues was
performed in the area of clinical concern.

[Series 1: us soft tissue right upper extremity limited (non- · 9 acquisitions, 9 frames shown]
[im 1/9]
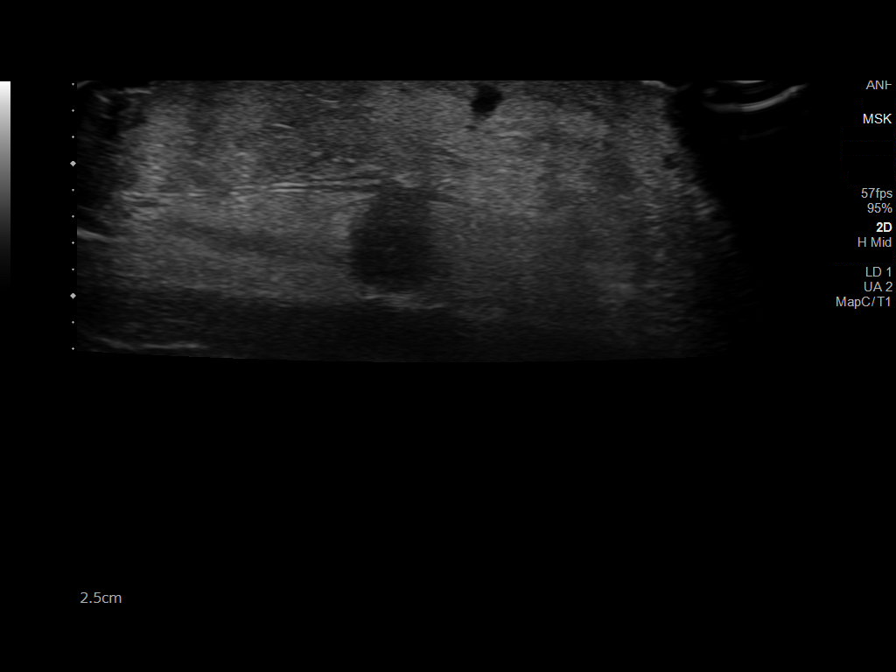
[im 2/9]
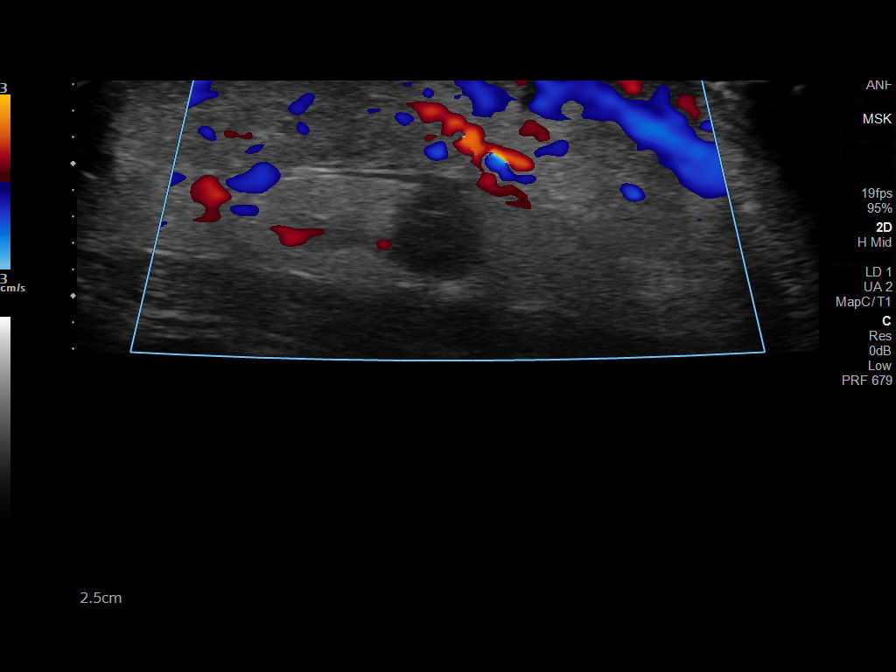
[im 3/9]
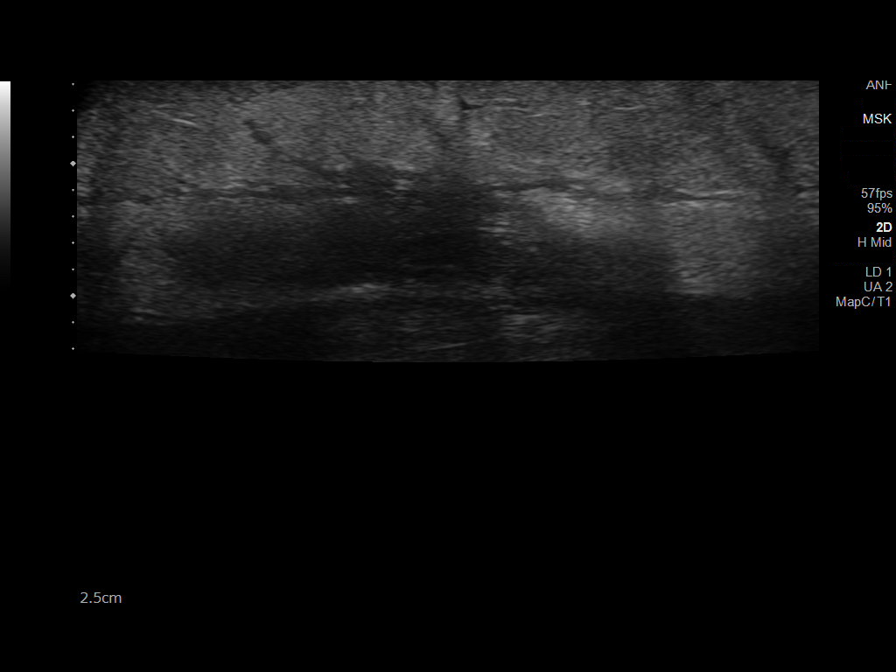
[im 4/9]
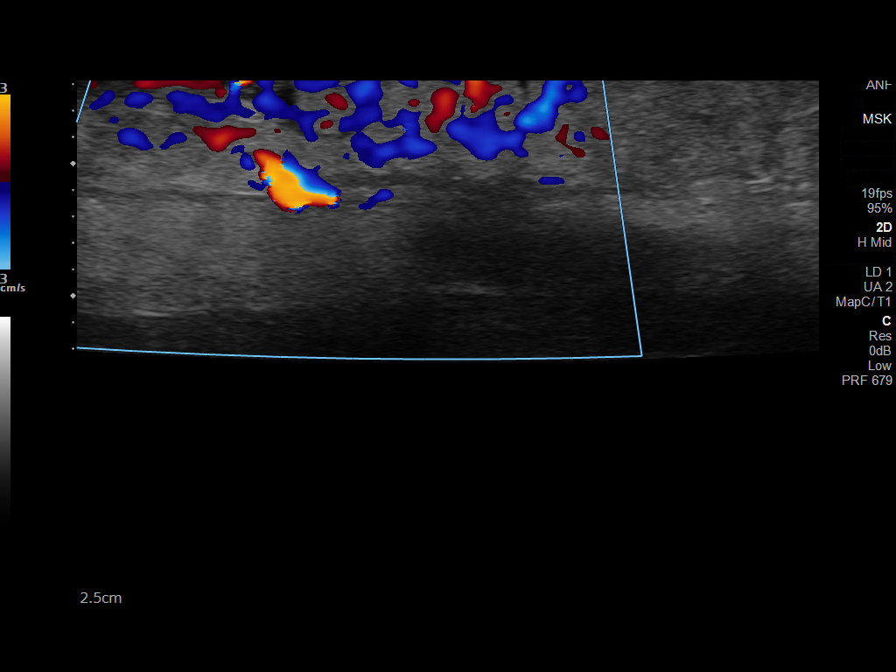
[im 5/9]
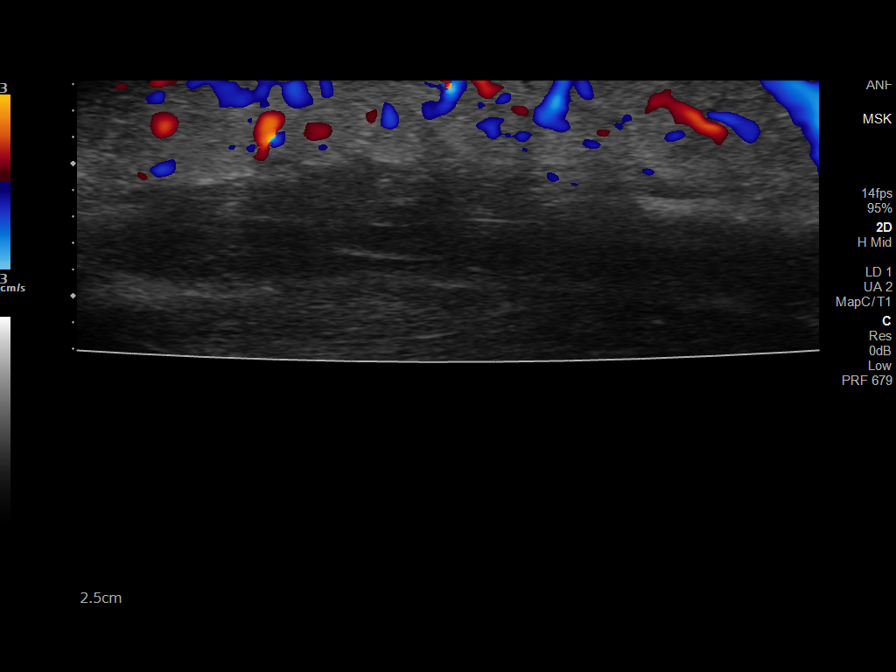
[im 6/9]
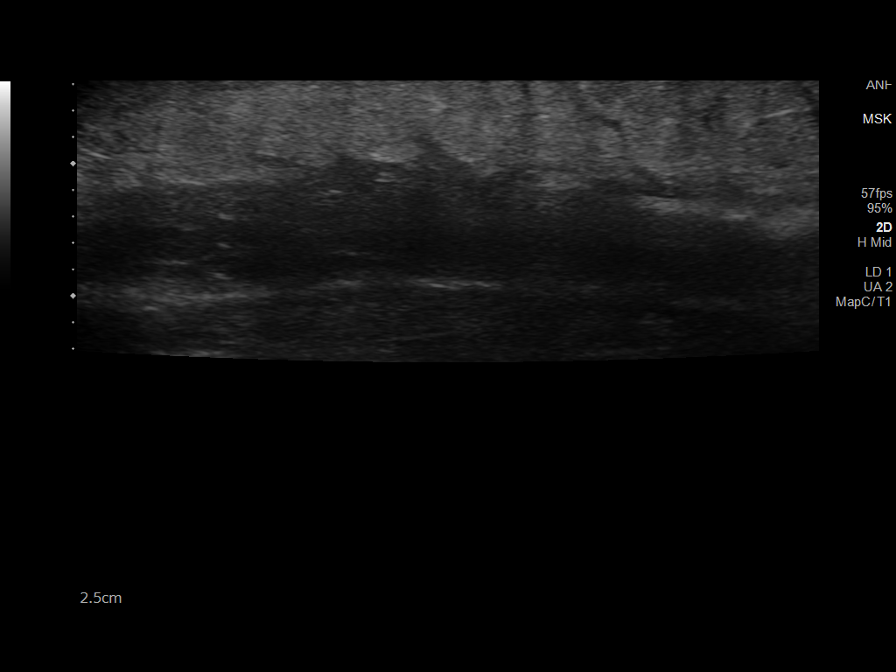
[im 7/9]
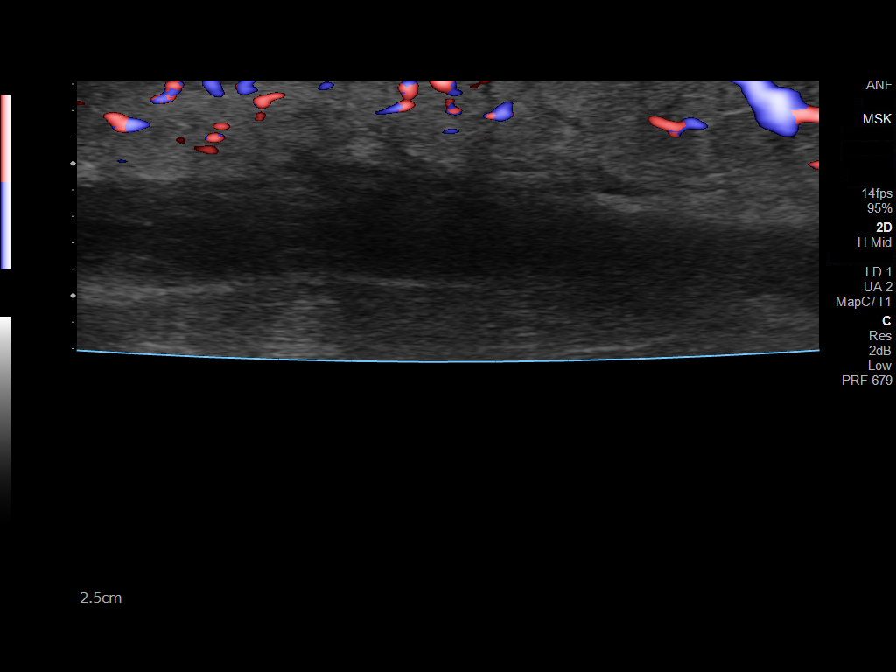
[im 8/9]
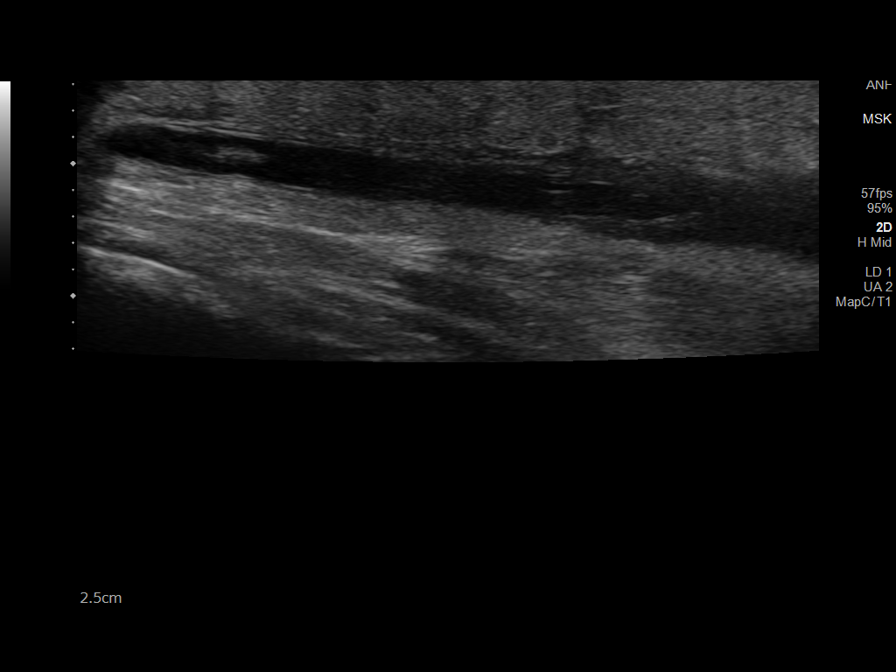
[im 9/9]
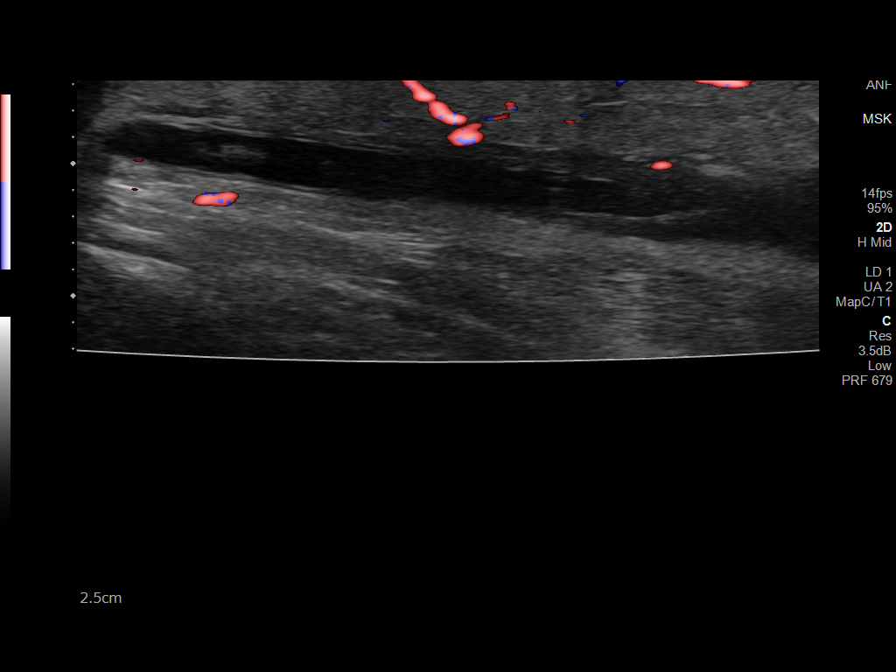

[9 of 9 positions shown; findings below may reference images not displayed]

FINDINGS: Focused ultrasound of the right forearm demonstrates prominent soft
tissue swelling with hyperemia. Superficial tubular structure
without internal flow deep to the subcutaneous edema concerning for
superficial venous thrombosis.
IMPRESSION: 1. Findings concerning for superficial venous thrombosis in the
right forearm. Recommend dedicated right upper extremity duplex
venous ultrasound.

## 2020-11-14 MED ORDER — CALCIUM CARBONATE ANTACID 500 MG PO CHEW
CHEWABLE_TABLET | ORAL | Status: AC
  Administered 2020-11-14: 400 mg via ORAL
  Filled 2020-11-14: qty 4

## 2020-11-14 MED ORDER — ACD FORMULA A 0.73-2.45-2.2 GM/100ML VI SOLN
Status: AC
  Administered 2020-11-14: 1000 mL
  Filled 2020-11-14: qty 500

## 2020-11-14 MED ORDER — SODIUM CHLORIDE 0.9 % IV SOLN
INTRAVENOUS | Status: AC
  Filled 2020-11-14 (×3): qty 200

## 2020-11-14 MED ORDER — ACD FORMULA A 0.73-2.45-2.2 GM/100ML VI SOLN
1000.0000 mL | Status: DC
  Filled 2020-11-14: qty 1000

## 2020-11-14 MED ORDER — VANCOMYCIN HCL 2000 MG/400ML IV SOLN
2000.0000 mg | Freq: Once | INTRAVENOUS | Status: DC
  Filled 2020-11-14: qty 400

## 2020-11-14 MED ORDER — HEPARIN SODIUM (PORCINE) 1000 UNIT/ML IJ SOLN
INTRAMUSCULAR | Status: AC
  Administered 2020-11-14: 2800 [IU]
  Filled 2020-11-14: qty 3

## 2020-11-14 MED ORDER — DIPHENHYDRAMINE HCL 25 MG PO CAPS: 25.0000 mg | ORAL_CAPSULE | Freq: Four times a day (QID) | ORAL | Status: DC | PRN

## 2020-11-14 MED ORDER — HEPARIN SODIUM (PORCINE) 1000 UNIT/ML IJ SOLN: 1000.0000 [IU] | Freq: Once | INTRAMUSCULAR | Status: AC

## 2020-11-14 MED ORDER — DOXYCYCLINE HYCLATE 100 MG PO TABS
100.0000 mg | ORAL_TABLET | Freq: Two times a day (BID) | ORAL | Status: DC
  Administered 2020-11-14 – 2020-11-19 (×11): 100 mg via ORAL
  Filled 2020-11-14 (×11): qty 1

## 2020-11-14 MED ORDER — CALCIUM GLUCONATE-NACL 2-0.675 GM/100ML-% IV SOLN
2.0000 g | Freq: Once | INTRAVENOUS | Status: AC
  Filled 2020-11-14: qty 100

## 2020-11-14 MED ORDER — VANCOMYCIN HCL 1500 MG/300ML IV SOLN: 1500.0000 mg | Freq: Two times a day (BID) | INTRAVENOUS | Status: DC

## 2020-11-14 MED ORDER — CALCIUM GLUCONATE-NACL 2-0.675 GM/100ML-% IV SOLN
INTRAVENOUS | Status: AC
  Administered 2020-11-14: 2000 mg via INTRAVENOUS
  Filled 2020-11-14: qty 100

## 2020-11-14 MED ORDER — CALCIUM CARBONATE ANTACID 500 MG PO CHEW
2.0000 | CHEWABLE_TABLET | ORAL | Status: AC
  Administered 2020-11-14: 400 mg via ORAL

## 2020-11-14 MED ORDER — ACETAMINOPHEN 325 MG PO TABS: 650.0000 mg | ORAL_TABLET | ORAL | Status: DC | PRN

## 2020-11-14 NOTE — Care Management Important Message (Signed)
Important Message  Patient Details  Name: Tyler Brewer MRN: [id number] Date of Birth: December 08, 1960   Medicare Important Message Given:  Yes     Milee Qualls P Leonard 11/14/2020, 2:26 PM

## 2020-11-14 NOTE — TOC Benefit Eligibility Note (Signed)
Transition of Care [facility]) Benefit Eligibility Note    Patient Details  Name: Tyler Brewer MRN: [id number] Date of Birth: 1961-07-15   Medication/Dose: Tyler Brewer  71 UNITS  DAILY  Covered?: Yes  Tier: 3 Drug  Prescription Coverage Preferred Pharmacy: Roseanne Kaufman with Person/Company/Phone Number:: [employer]  @ [facility]   # [phone] OPT- MEMBER  Co-Pay: $47.00  Prior Approval: No  Deductible: Met       Memory Argue Phone Number: 11/14/2020, 11:46 AM

## 2020-11-14 NOTE — Progress Notes (Addendum)
Pharmacy Antibiotic Note  Tyler Brewer is a 60 y.o. male admitted on 11/04/2020 with CIDP and possible wound infection.  Pharmacy has been consulted for vancomycin dosing.  Plan: Vancomycin 2000mg  IV x 1, then 1500mg  IV q12h Estimated AUC 496 with Scr 0.83 Vancomycin levels as needed Monitor for clinical course and deescalation  Height: 5\' 11"  (180.3 cm) Weight: 132.5 kg (292 lb 1.8 oz) IBW/kg (Calculated) : 75.3  Temp (24hrs), Avg:98.4 F (36.9 C), Min:98.2 F (36.8 C), Max:98.5 F (36.9 C)  Recent Labs  Lab 11/11/20 0714 11/11/20 0935 11/12/20 0219 11/12/20 0346 11/13/20 0250 11/14/20 0251  WBC 7.3  --   --  9.2 8.1 8.2  CREATININE 0.85 0.90 0.85  --  0.88 0.83    Estimated Creatinine Clearance: 136.4 mL/min (by C-G formula based on SCr of 0.83 mg/dL).    No Known Allergies  Antimicrobials this admission: 3/28 Vancomycin >>    Dose adjustments this admission: n/a  Microbiology results: 3/23 CSF: NGTD   Tangee Marszalek A. Levada Dy, PharmD, BCPS, FNKF Clinical Pharmacist [place] Please utilize Amion for appropriate phone number to reach the unit pharmacist ([place])  Addendum:  Patient refusing IV abx at this time. IV removed and patient placed on PO abx. D/W Dr. Candiss Norse, will d/c vancomycin at this time and resume when patient ready for IV abx.   Buell Parcel A. Levada Dy, PharmD, BCPS, FNKF Clinical Pharmacist [place] Please utilize Amion for appropriate phone number to reach the unit pharmacist ([place])     11/14/2020 7:53 AM

## 2020-11-14 NOTE — Progress Notes (Signed)
Nutrition Follow-up  DOCUMENTATION CODES:   Obesity unspecified  INTERVENTION:  -Recommend initiation of bowel regimen as no BM documented since 3/21. -Continue Ensure Max po BID, each supplement provides 150 kcal and 30 grams of protein -Continue MVI with minerals daily  NUTRITION DIAGNOSIS:   Unintentional weight loss related to decreased appetite as evidenced by per patient/family report. - ongoing (edema likely masking weight loss)  GOAL:   Patient will meet greater than or equal to 90% of their needs - met.  MONITOR:   PO intake,Supplement acceptance,Weight trends,Labs,I & O's  REASON FOR ASSESSMENT:   Malnutrition Screening Tool    ASSESSMENT:   Pt admitted with recurrent falls 2/2 progressive BLE weakness. PMH includes CAD, type 2 DM, prior substance abuse, diabetic neuropathy, and OSA.  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 Pt to have R arm soft tissue ultrasound. It was recommended pt have IV vancomycin, but pt refused IV and requested oral abx instead. MD warned pt of possibility of gangrene and death; pt expressed understanding.   Pt unavailable at time of RD visit.    Pt with excellent PO intake -- 100% meal completion since admit. Pt receiving Ensure Max BID and is typically consuming well per RN.   Note pt's weight was updated on 11/12/20 -- pt weighs 132.5 kg. No significant weight loss per wt readings, though edema may be masking reported weight loss as RN notes pt with the following: non-pitting edema to RUE and moderate pitting edema to BLE.   No UOP documented.   Recommend initiation of bowel regimen as no BM documented since 3/21.   Medications: SSI TID w/ meals, 8  units novolog TID w/ meals, 50 units lantus daily, mvi with minerals, vitamin B6 Labs reviewed. CBGs [phone] (Diabetes Coordinator following)  Diet Order:   Diet Order            Diet Carb Modified Fluid consistency: Thin; Room service appropriate? Yes  Diet effective now                 EDUCATION NEEDS:   No education needs have been identified at this time  Skin:  Skin Assessment: Reviewed RN Assessment  Last BM:  3/21  Height:   Ht Readings from Last 1 Encounters:  11/08/20 5' 11" (1.803 m)    Weight:   Wt Readings from Last 1 Encounters:  11/12/20 132.5 kg    Ideal Body Weight:  78.18 kg  BMI:  Body mass index is 40.74 kg/m.  Estimated Nutritional Needs:   Kcal:  2500-2700  Protein:  125-155 grams  Fluid:  >2.5L    Amanda Averett, MS, RD, LDN RD pager number and weekend/on-call pager number located in Amion.  

## 2020-11-14 NOTE — Progress Notes (Signed)
PROGRESS NOTE        PATIENT DETAILS Name: Tyler Brewer Age: 60 y.o. Sex: male Date of Birth: 02/25/1961 Admit Date: 11/04/2020 Admitting Physician Etta Quill, DO YKZ:LDJTT Koren Shiver, DO  Brief Narrative: Patient is a 60 y.o. male with history of DM-2, CAD, OSA-presenting to the hospital with frequent falls-patient has had progressive bilateral lower extremity weakness since August 2021.  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  See below for further details  Significant events: 3/18>> admit for lower extremity weakness 3/25>> PLEX  started  Significant studies: 2/23>> MRI thoracic spine: No acute abnormalities noted. 3/19>> MRI lumbar sacral spine: No acute process identified. 3/19>> MRI C-spine: Small disc herniation at C3-C4, C4-C5-no abnormal cord signal. 3/19>> vitamin B12: 538 3/19>> HIV: Nonreactive 3/19>> TSH: Normal limits 3/19>> vitamin B6: Pending 3/19>> HSV 1/HSV 2 DNA by PCR blood: Negative 3/19>> CMV DNA by PCR blood>> negative 3/20>> MRI T-spine with contrast: Normal-appearing thoracic cord 3/20>> MRI C-spine: Normal-appearing cervical cord-no pathologic enhancement. 3/23>> CSF: WBC 3, protein 124 3/23>> arbovirus panel- CSF: Pending   Antimicrobial therapy: None  Microbiology data: 3/23>> CSF culture: No growth  Procedures : 3/23>> fluoroscopy-guided lumbar puncture  Consults: Neurology  DVT Prophylaxis : Prophylactic Lovenox   Subjective:  Patient in bed, appears comfortable, denies any headache, no fever, no chest pain or pressure, no shortness of breath , no abdominal pain.  Continues to have diffuse weakness worse in the left lower extremity, thinks there is mild improvement over the last 2 days.   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  Continue PT-OT May require placement.   Right arm with previous IV site redness and possible abscess.  Obtain right arm soft tissue ultrasound, ordered IV vancomycin but patient has refused IV antibiotics and states he is too tired to get an IV placed, warned him of the consequences of gangrene and death, doxy for now and monitor.  Warm compress to continue, IV has already been removed  Noncompliance with medical recommendations.  Patient counseled and warned of consequences, he understands and assumes all responsibility of any adverse outcome.  Peripheral diabetic nephropathy: Continue Neurontin/Mobic and Cymbalta.  HTN: On combination of losartan, Norvasc and have added hydralazine, monitor.  HLD: Continue statin  BPH: Continue Flomax  Hyponatremia: Appears to be mild-and chronic-continue to follow periodically.  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  Obesity: Estimated body mass index is 40.74 kg/m as calculated from the following:   Height as of this encounter: 5\' 11"  (1.803 m).   Weight as of this encounter: 132.5 kg.   DM-2 (A1c 10.6 on 3/19): On Lantus along with premeal NovoLog and sliding scale, Lantus increased for better control on 11/13/2020.  Poor outpatient control due to hyperglycemia.  CBG (last 3)  Recent Labs    11/13/20 1628 11/13/20 2117 11/14/20 0742  GLUCAP 159* 219* 192*     Diet: Diet Order            Diet Carb Modified Fluid consistency: Thin; Room service appropriate? Yes  Diet effective now  Code Status: Full code   Family Communication:  Mother [name] [phone] on 11/12/20  Disposition Plan: Status is: Inpatient  The patient will require care spanning > 2 midnights and should be moved to inpatient because: Inpatient level of care appropriate due to severity of illness  Dispo:  The patient is from: Home              Anticipated d/c is to: To be determined              Patient currently is not medically stable to d/c.   Difficult to place patient No    Barriers to Discharge: CIDP-starting PLEX 3/25  Antimicrobial agents: Anti-infectives (From admission, onward)   Start     Dose/Rate Route Frequency Ordered Stop   11/14/20 2100  vancomycin (VANCOREADY) IVPB 1500 mg/300 mL        1,500 mg 150 mL/hr over 120 Minutes Intravenous Every 12 hours 11/14/20 0757     11/14/20 1000  doxycycline (VIBRA-TABS) tablet 100 mg        100 mg Oral Every 12 hours 11/14/20 0907     11/14/20 0845  vancomycin (VANCOREADY) IVPB 2000 mg/400 mL        2,000 mg 200 mL/hr over 120 Minutes Intravenous  Once 11/14/20 0757     11/13/20 1030  fluconazole (DIFLUCAN) tablet 100 mg        100 mg Oral Daily 11/13/20 0941 11/20/20 0959       Time spent: 25- minutes-Greater than 50% of this time was spent in counseling, explanation of diagnosis, planning of further management, and coordination of care.  MEDICATIONS: Scheduled Meds: . amLODipine  5 mg Oral Daily  . Chlorhexidine Gluconate Cloth  6 each Topical Daily  . doxycycline  100 mg Oral Q12H  . DULoxetine  30 mg Oral Once per day on Mon Wed Fri  . enoxaparin (LOVENOX) injection  60 mg Subcutaneous Q24H  . fluconazole  100 mg Oral Daily  . gabapentin  300 mg Oral 2 times per day  . gabapentin  600 mg Oral Daily  . heparin sodium (porcine)  1,000 Units Intracatheter Once  . hydrALAZINE  50 mg Oral Q8H  . insulin aspart  0-15 Units Subcutaneous TID WC  . insulin aspart  8 Units Subcutaneous TID WC  . insulin glargine  50 Units Subcutaneous Daily  . losartan  100 mg Oral Daily  . multivitamin with minerals  1 tablet Oral Daily  . nystatin   Topical BID  . pravastatin  40 mg Oral q1800  . Ensure Max Protein  11 oz Oral BID  . vitamin B-6  100 mg Oral Daily  . tamsulosin  0.4 mg Oral Daily   Continuous Infusions: . citrate  dextrose    . vancomycin    . vancomycin     PRN Meds:.acetaminophen **OR** acetaminophen, acetaminophen, diclofenac Sodium, diphenhydrAMINE, lidocaine (PF), LORazepam, meloxicam, methocarbamol, [DISCONTINUED] ondansetron **OR** ondansetron (ZOFRAN) IV, polyethylene glycol, traMADol   PHYSICAL EXAM: Vital signs: Vitals:   11/12/20 2016 11/13/20 0429 11/13/20 1055 11/13/20 2121  BP:  (!) 151/75 (!) 151/84 (!) 148/99  Pulse:  95 83 87  Resp:  20 18 20   Temp:  98.2 F (36.8 C) 98.5 F (36.9 C) 98.2 F (36.8 C)  TempSrc:  Oral Oral Oral  SpO2:  94% 97% 100%  Weight: 132.5 kg     Height:       Filed Weights   11/04/20 1409 11/12/20 2016  Weight: 124.7  kg 132.5 kg   Body mass index is 40.74 kg/m.   Gen Exam:  Awake Alert, strength in upper extremities 5/5, right lower extremity 4/5, left lower leg 3/5, R forearm IV site redness ? Small abscess [place].AT,PERRAL Supple Neck,No JVD, No cervical lymphadenopathy appriciated.  Symmetrical Chest wall movement, Good air movement bilaterally, CTAB RRR,No Gallops, Rubs or new Murmurs, No Parasternal Heave +ve B.Sounds, Abd Soft, No tenderness, No organomegaly appriciated, No rebound - guarding or rigidity. No Cyanosis, Clubbing or edema,    I have personally reviewed following labs and imaging studies  LABORATORY DATA: CBC: Recent Labs  Lab 11/11/20 0714 11/11/20 0935 11/12/20 0346 11/13/20 0250 11/14/20 0251  WBC 7.3  --  9.2 8.1 8.2  NEUTROABS  --   --   --  5.2 4.8  HGB 14.4 13.6 13.7 13.4 13.1  HCT 42.8 40.0 39.1 39.2 40.2  MCV 89.5  --  87.1 89.1 91.6  PLT 255  --  248 211 093    Basic Metabolic Panel: Recent Labs  Lab 11/08/20 0400 11/11/20 0714 11/11/20 0935 11/12/20 0219 11/13/20 0250 11/14/20 0251  NA 134* 137 139 134* 135 133*  K 4.2 4.3 3.9 4.2 4.3 4.0  CL 104 105 101 105 105 104  CO2 24 25  --  22 26 23   GLUCOSE 174* 171* 177* 251* 309* 250*  BUN 25* 18 20 20 16 17   CREATININE 0.97 0.85 0.90 0.85  0.88 0.83  CALCIUM 8.8* 9.1  --  8.7* 9.1 8.9  MG 1.8  --   --   --  1.7 1.8    GFR: Estimated Creatinine Clearance: 136.4 mL/min (by C-G formula based on SCr of 0.83 mg/dL).  Liver Function Tests: Recent Labs  Lab 11/09/20 0938 11/12/20 0219 11/13/20 0250 11/14/20 0251  AST  --  16 11* 13*  ALT  --  19 16 19   ALKPHOS  --  55 45 46  BILITOT  --  0.5 0.7 0.2*  PROT  --  5.1* 5.0* 5.2*  ALBUMIN 3.3* 3.5 3.7 3.5   No results for input(s): LIPASE, AMYLASE in the last 168 hours. No results for input(s): AMMONIA in the last 168 hours.  Coagulation Profile: No results for input(s): INR, PROTIME in the last 168 hours.  Cardiac Enzymes: No results for input(s): CKTOTAL, CKMB, CKMBINDEX, TROPONINI in the last 168 hours.  BNP (last 3 results) No results for input(s): PROBNP in the last 8760 hours.  Lipid Profile: No results for input(s): CHOL, HDL, LDLCALC, TRIG, CHOLHDL, LDLDIRECT in the last 72 hours.  Thyroid Function Tests: No results for input(s): TSH, T4TOTAL, FREET4, T3FREE, THYROIDAB in the last 72 hours.  Anemia Panel: No results for input(s): VITAMINB12, FOLATE, FERRITIN, TIBC, IRON, RETICCTPCT in the last 72 hours.  Urine analysis:    Component Value Date/Time   COLORURINE YELLOW 11/05/2020 2029   APPEARANCEUR HAZY (A) 11/05/2020 2029   LABSPEC 1.028 11/05/2020 2029   PHURINE 5.0 11/05/2020 2029   GLUCOSEU >=500 (A) 11/05/2020 2029   GLUCOSEU 100 (A) 09/25/2016 1041   HGBUR NEGATIVE 11/05/2020 2029   BILIRUBINUR NEGATIVE 11/05/2020 2029   BILIRUBINUR neg 04/26/2016 0836   KETONESUR NEGATIVE 11/05/2020 2029   PROTEINUR NEGATIVE 11/05/2020 2029   UROBILINOGEN 0.2 09/25/2016 1041   NITRITE NEGATIVE 11/05/2020 2029   LEUKOCYTESUR NEGATIVE 11/05/2020 2029    Sepsis Labs: Lactic Acid, Venous    Component Value Date/Time   LATICACIDVEN 1.3 01/21/2020 0915    MICROBIOLOGY: Recent Results (from  the past 240 hour(s))  SARS CORONAVIRUS 2 (TAT 6-24 HRS)  Nasopharyngeal Nasopharyngeal Swab     Status: None   Collection Time: 11/05/20  7:50 AM   Specimen: Nasopharyngeal Swab  Result Value Ref Range Status   SARS Coronavirus 2 NEGATIVE NEGATIVE Final    Comment: (NOTE) SARS-CoV-2 target nucleic acids are NOT DETECTED.  The SARS-CoV-2 RNA is generally detectable in upper and lower respiratory specimens during the acute phase of infection. Negative results do not preclude SARS-CoV-2 infection, do not rule out co-infections with other pathogens, and should not be used as the sole basis for treatment or other patient management decisions. Negative results must be combined with clinical observations, patient history, and epidemiological information. The expected result is Negative.  Fact Sheet for Patients: SugarRoll.be  Fact Sheet for Healthcare Providers: https://www.woods-mathews.com/  This test is not yet approved or cleared by the [place] FDA and  has been authorized for detection and/or diagnosis of SARS-CoV-2 by FDA under an Emergency Use Authorization (EUA). This EUA will remain  in effect (meaning this test can be used) for the duration of the COVID-19 declaration under Se ction 564(b)(1) of the Act, 21 U.S.C. section 360bbb-3(b)(1), unless the authorization is terminated or revoked sooner.  Performed at [facility], [place] [address] [place], [place] [address]   CSF culture w Gram Stain     Status: None   Collection Time: 11/09/20  9:38 AM   Specimen: PATH Cytology CSF; Cerebrospinal Fluid  Result Value Ref Range Status   Specimen Description CSF  Final   Special Requests NONE  Final   Gram Stain   Final    WBC PRESENT, PREDOMINANTLY MONONUCLEAR NO ORGANISMS SEEN CYTOSPIN SMEAR    Culture   Final    NO GROWTH Performed at [facility], [place] [address], [place],  [address]    Report Status 11/12/2020 FINAL  Final    RADIOLOGY STUDIES/RESULTS: No  results found.   LOS: 8 days   Lala Lund, MD  [facility]  11/14/2020, 9:08 AM

## 2020-11-14 NOTE — Plan of Care (Signed)
  Problem: Education: Goal: Knowledge of General Education information will improve Description Including pain rating scale, medication(s)/side effects and non-pharmacologic comfort measures Outcome: Progressing   
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
## 2020-11-15 ENCOUNTER — Inpatient Hospital Stay (HOSPITAL_COMMUNITY): Payer: Medicare HMO

## 2020-11-15 DIAGNOSIS — M79601 Pain in right arm: Secondary | ICD-10-CM

## 2020-11-15 DIAGNOSIS — L538 Other specified erythematous conditions: Secondary | ICD-10-CM | POA: Diagnosis not present

## 2020-11-15 DIAGNOSIS — M7989 Other specified soft tissue disorders: Secondary | ICD-10-CM

## 2020-11-15 DIAGNOSIS — R29898 Other symptoms and signs involving the musculoskeletal system: Secondary | ICD-10-CM | POA: Diagnosis not present

## 2020-11-15 DIAGNOSIS — I10 Essential (primary) hypertension: Secondary | ICD-10-CM | POA: Diagnosis not present

## 2020-11-15 LAB — COMPREHENSIVE METABOLIC PANEL
ALT: 13 U/L (ref 0–44)
AST: 10 U/L — ABNORMAL LOW (ref 15–41)
Albumin: 3.8 g/dL (ref 3.5–5.0)
Alkaline Phosphatase: 32 U/L — ABNORMAL LOW (ref 38–126)
Anion gap: 5 (ref 5–15)
BUN: 18 mg/dL (ref 6–20)
CO2: 24 mmol/L (ref 22–32)
Calcium: 9 mg/dL (ref 8.9–10.3)
Chloride: 106 mmol/L (ref 98–111)
Creatinine, Ser: 0.87 mg/dL (ref 0.61–1.24)
GFR, Estimated: >60 mL/min (ref >60)
Glucose, Bld: 196 mg/dL — ABNORMAL HIGH (ref 70–99)
Potassium: 4.2 mmol/L (ref 3.5–5.1)
Sodium: 135 mmol/L (ref 135–145)
Total Bilirubin: 0.7 mg/dL (ref 0.3–1.2)
Total Protein: 4.9 g/dL — ABNORMAL LOW (ref 6.5–8.1)

## 2020-11-15 LAB — CBC WITH DIFFERENTIAL/PLATELET
Abs Immature Granulocytes: 0.05 10*3/uL (ref 0.00–0.07)
Basophils Absolute: 0 10*3/uL (ref 0.0–0.1)
Basophils Relative: 1 %
Eosinophils Absolute: 0.2 10*3/uL (ref 0.0–0.5)
Eosinophils Relative: 2 %
HCT: 37.7 % — ABNORMAL LOW (ref 39.0–52.0)
Hemoglobin: 12.6 g/dL — ABNORMAL LOW (ref 13.0–17.0)
Immature Granulocytes: 1 %
Lymphocytes Relative: 17 %
Lymphs Abs: 1.4 10*3/uL (ref 0.7–4.0)
MCH: 30.1 pg (ref 26.0–34.0)
MCHC: 33.4 g/dL (ref 30.0–36.0)
MCV: 90 fL (ref 80.0–100.0)
Monocytes Absolute: 1 10*3/uL (ref 0.1–1.0)
Monocytes Relative: 12 %
Neutro Abs: 5.8 10*3/uL (ref 1.7–7.7)
Neutrophils Relative %: 67 %
Platelets: 208 10*3/uL (ref 150–400)
RBC: 4.19 MIL/uL — ABNORMAL LOW (ref 4.22–5.81)
RDW: 12.4 % (ref 11.5–15.5)
WBC: 8.5 10*3/uL (ref 4.0–10.5)
nRBC: 0 % (ref 0.0–0.2)

## 2020-11-15 LAB — PROCALCITONIN: Procalcitonin: 0.11 ng/mL

## 2020-11-15 LAB — GLUCOSE, CAPILLARY
Glucose-Capillary: 177 mg/dL — ABNORMAL HIGH (ref 70–99)
Glucose-Capillary: 209 mg/dL — ABNORMAL HIGH (ref 70–99)
Glucose-Capillary: 229 mg/dL — ABNORMAL HIGH (ref 70–99)
Glucose-Capillary: 229 mg/dL — ABNORMAL HIGH (ref 70–99)

## 2020-11-15 LAB — MAGNESIUM: Magnesium: 1.7 mg/dL (ref 1.7–2.4)

## 2020-11-15 MED ORDER — TOBRAMYCIN 0.3 % OP OINT
TOPICAL_OINTMENT | Freq: Three times a day (TID) | OPHTHALMIC | Status: DC
  Filled 2020-11-15 (×2): qty 3.5

## 2020-11-15 NOTE — Progress Notes (Signed)
NEUROLOGY CONSULTATION PROGRESS NOTE   Date of service: November 15, 2020 Patient Name: Tyler Brewer MRN:  [id number] DOB:  11-Jun-1961  Brief HPI  Tyler Brewer is a 60 y.o. male with PMH significant for coronary artery disease, depression, diabetes, narcotic addiction, hyperlipidemia, hypertension, obesity, neuropathy, OSA who presents to emergency department with recurrent falls due to progressive right bilateral extremity weakness. His neurologic examination is notable for left greater than right bilateral lower extremity weakness with sensory neuropathy. Spine imaging and MR neurography was non revealing. CSF studies with albuminocytologic dissociation and treated for CIDP with PLEX x 5.   Interval Hx   Had third plex today, reports about 15-20 percent improvement in weakness and significant improvement in sensory symptoms and now can feel more in his feet.  Reports L eye pain today, primary team concerned about stye and consulted ophthalmology.  Vitals   Vitals:   11/14/20 1815 11/14/20 2002 11/15/20 0524 11/15/20 0830  BP: 90/67 136/77 (!) 154/93 119/71  Pulse: 82 91 90 94  Resp: 14 18 20 18   Temp: 98.1 F (36.7 C) 98.1 F (36.7 C) (!) 97.5 F (36.4 C) 97.7 F (36.5 C)  TempSrc: Oral  Oral   SpO2:  100% 100% 99%  Weight:      Height:         Body mass index is 40.74 kg/m.  Physical Exam   General: Laying comfortably in bed; in no acute distress. HENT: Normal oropharynx and mucosa. Normal external appearance of ears and nose. Neck: Supple, no pain or tenderness CV: No JVD. No peripheral edema. Pulmonary: Symmetric Chest rise. Normal respiratory effort. Abdomen: Soft to touch, non-tender. Ext: No cyanosis, edema, or deformity Skin: No rash. Normal palpation of skin.  Musculoskeletal: Normal digits and nails by inspection. No clubbing.  Neurologic Examination  Mental status/Cognition: Alert, oriented to self, place, month and year, good attention.   Speech/language: Fluent, comprehension intact, object naming intact, repetition intact.  Cranial nerves:   CN II Declined to let me evaluate his eyes due to pain   CN III,IV,VI Declined to let me evaluate his eyes due to pain   CN V normal sensation in V1, V2, and V3 segments bilaterally   CN VII no asymmetry, no nasolabial fold flattening   CN VIII normal hearing to speech   CN IX & X normal palatal elevation, no uvular deviation   CN XI 5/5 head turn and 5/5 shoulder shrug bilaterally   CN XII midline tongue protrusion   Motor:  Muscle bulk: normal, tone normal, pronator drift none  Mvmt Root Nerve  Muscle Right Left Comments  SA C5/6 Ax Deltoid 5 5   EF C5/6 Mc Biceps 5 5   EE C6/7/8 Rad Triceps 5 5   WF C6/7 Med FCR     WE C7/8 PIN ECU     F Ab C8/T1 U ADM/FDI 5 5   HF L1/2/3 Fem Illopsoas 3 3-   KE L2/3/4 Fem Quad 4 4   DF L4/5 D Peron Tib Ant 4 2   PF S1/2 Tibial Grc/Sol 4 1    Reflexes:  Right Left Comments  Pectoralis      Biceps (C5/6) 2 2   Brachioradialis (C5/6) 2 2    Triceps (C6/7) 2 2    Patellar (L3/4) 2 2    Achilles (S1) 0 0    Hoffman      Plantar     Jaw jerk    Sensation:  Light touch Intact  throughout   Pin prick    Temperature    Vibration   Proprioception    Coordination/Complex Motor:  - Finger to Nose intact BL - Heel to shin unable to do - Rapid alternating movement are normal in BL upper extremities. - Gait: Deferred.  Labs   Basic Metabolic Panel:  Lab Results  Component Value Date   NA 135 11/15/2020   K 4.2 11/15/2020   CO2 24 11/15/2020   GLUCOSE 196 (H) 11/15/2020   BUN 18 11/15/2020   CREATININE 0.87 11/15/2020   CALCIUM 9.0 11/15/2020   GFRNONAA >60 11/15/2020   GFRAA >60 01/21/2020   HbA1c:  Lab Results  Component Value Date   HGBA1C 10.6 (H) 11/05/2020   LDL:  Lab Results  Component Value Date   LDLCALC 83 07/05/2020   Urine Drug Screen:     Component Value Date/Time   LABOPIA NONE DETECTED 07/07/2020  2120   COCAINSCRNUR NONE DETECTED 07/07/2020 2120   LABBENZ NONE DETECTED 07/07/2020 2120   AMPHETMU POSITIVE (A) 07/07/2020 2120   THCU NONE DETECTED 07/07/2020 2120   LABBARB NONE DETECTED 07/07/2020 2120    Alcohol Level     Component Value Date/Time   ETH <10 07/07/2020 2132   No results found for: PHENYTOIN, ZONISAMIDE, LAMOTRIGINE, LEVETIRACETA No results found for: PHENYTOIN, PHENOBARB, VALPROATE, CBMZ  Imaging and Diagnostic studies   HSV PCR negative CMV PCR negative [place] PCR was cancelled.  MRI C spine w + w/o C: IMPRESSION: Normal appearing cervical cord. Negative for pathologic enhancement.  MRI T spine w + w/o C: Normal appearing thoracic cord. No finding to explain the patient's symptoms. No change in small right paracentral protrusion at T4-5 without Stenosis.  MR L spine w + wo C: 1. Mild nonspecific inflammation in the right erector spinae muscle at L2-L3, and on the left at S1-S2. These might be posttraumatic in this setting. 2. No other acute or inflammatory process identified in the lumbar spine. 3. Mild lumbar spine degeneration without spinal stenosis. Rightward L2-L3 disc protrusion with annular fissure does appear mildly increased since December. Query right L3 radiculitis. Moderately advanced chronic facet degeneration on the left at L5-S1.   CSF protein: 124 with no pleocytosis.  Impression   Tyler Brewer is a 60 y.o.  male with PMH significant for coronary artery disease, depression, diabetes, narcotic addiction, hyperlipidemia, hypertension, obesity, neuropathy, OSA who presents to emergency department with recurrent falls due to progressive right bilateral extremity weakness. His neurologic examination is notable for left greater than right bilateral lower extremity weakness with sensory neuropathy. Spine imaging and MR neurography was non revealing. CSF studies with albuminocytologic dissociation and treated for CIDP with PLEX x  5.  Reports subjective improvement in weakness but objectively, exam appears the same. Recommendations  - continue PLEX for 2 more sessions. Tomorrow and April 1st. Will reassess after last session of PLEX for any improvement. - Follow up with Dr. Narda Amber outpatient - He will be an excellent candidate for inpatient rehab. Would recommend PT and OT evaluate him for potential inpatient rehab. ______________________________________________________________________   Thank you for the opportunity to take part in the care of this patient. If you have any further questions, please contact the neurology consultation attending.  Signed,  [place] Pager Number [phone]

## 2020-11-15 NOTE — Progress Notes (Signed)
Upper extremity venous RT study completed.   Please see CV Proc for preliminary results.   Ailyne Pawley, RDMS, RVT  
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
## 2020-11-15 NOTE — Plan of Care (Signed)
  Problem: Education: Goal: Knowledge of General Education information will improve Description Including pain rating scale, medication(s)/side effects and non-pharmacologic comfort measures Outcome: Progressing   

## 2020-11-15 NOTE — Consult Note (Signed)
Reason for consult:  Eye pain and swelling  HPI: Tyler Brewer is an 60 y.o. male.  He was admitted for falls and weakness.  He had plasmapheresis yesterday.  During that he had sudden onset of pain and foreign body sensation OS.  Started tearing.  Patient admits to rubbing eye.  When he woke up this morning, his left eye hurt.  It was too swollen to open.  Primary team could not get the eye open. Pain and swelling have improved since waking this am.  He can now get the eye open, but the vision is blurry. No diplopia.  Past ocular history:  He had a bad eye infection several years ago and saw Dr.Gegeingack at [facility].  It healed with drops.  He sees Dr. Posey Pronto at [facility] for annual diabetic exams.  He says he is about 18 months overdue.  Family ocular history: negative    Past Medical History:  Diagnosis Date  . Arthritis   . CAD (coronary artery disease)  cardiac cath with moderate disease in a septal branch of the ramus intermedius 04/01/2012  . Depression   . Diabetes mellitus    poorly controlled by his report  . History of narcotic addiction ([place])    past history of back pain  . Hypercholesteremia   . Hypertension   . IBS (irritable bowel syndrome)   . 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 Neuropathy   . Obesity    Max weight was 390  . OSA on CPAP   . Panic attacks   . Testosterone deficiency   . Vertigo    Past Surgical History:  Procedure Laterality Date  . CARDIAC CATHETERIZATION    . IR FLUORO GUIDE CV LINE RIGHT  11/10/2020  . IR US GUIDE VASC ACCESS RIGHT  11/10/2020  . LEFT HEART CATHETERIZATION WITH CORONARY ANGIOGRAM N/A 03/31/2012   Procedure: LEFT HEART CATHETERIZATION WITH CORONARY ANGIOGRAM;  Surgeon: Leonie Man, MD;  Location: [facility] CATH LAB;  Service: Cardiovascular;  Laterality: N/A;   Family History  Problem Relation Age of Onset  . Diabetes type II Father   . Hypertension Father   . Pancreatic disease Father 82       Deceased  . Healthy Mother   .  Healthy Sister   . Healthy Son   . Healthy Daughter   . Parkinson's disease Maternal Grandmother    Current Facility-Administered Medications  Medication Dose Route Frequency Provider Last Rate Last Admin  . acetaminophen (TYLENOL) tablet 650 mg  650 mg Oral Q6H PRN Etta Quill, DO   650 mg at 11/12/20 2002   Or  . acetaminophen (TYLENOL) suppository 650 mg  650 mg Rectal Q6H PRN Etta Quill, DO      . acetaminophen (TYLENOL) tablet 650 mg  650 mg Oral Q4H PRN Kerney Elbe, MD   650 mg at 11/13/20 1958  . amLODipine (NORVASC) tablet 5 mg  5 mg Oral Daily Jonetta Osgood, MD   5 mg at 11/15/20 1572  . Chlorhexidine Gluconate Cloth 2 % PADS 6 each  6 each Topical Daily Jonetta Osgood, MD   6 each at 11/13/20 0935  . citrate dextrose (ACD-A anticoagulant) solution 1,000 mL  1,000 mL Other Continuous Kerney Elbe, MD      . citrate dextrose (ACD-A anticoagulant) solution 1,000 mL  1,000 mL Other Continuous Kerney Elbe, MD   1,000 mL at 11/14/20 1512  . diclofenac Sodium (VOLTAREN) 1 % topical gel 2 g  2 g Topical QID PRN Etta Quill, DO   2 g at 11/11/20 2148  . diphenhydrAMINE (BENADRYL) capsule 25 mg  25 mg Oral Q6H PRN Kerney Elbe, MD      . doxycycline (VIBRA-TABS) tablet 100 mg  100 mg Oral Q12H Thurnell Lose, MD   100 mg at 11/15/20 0824  . DULoxetine (CYMBALTA) DR capsule 30 mg  30 mg Oral Once per day on Mon Wed Fri Jennette Kettle M, DO   30 mg at 11/14/20 4259  . enoxaparin (LOVENOX) injection 60 mg  60 mg Subcutaneous Q24H Jonetta Osgood, MD   60 mg at 11/15/20 5638  . fluconazole (DIFLUCAN) tablet 100 mg  100 mg Oral Daily Thurnell Lose, MD   100 mg at 11/15/20 0823  . gabapentin (NEURONTIN) capsule 300 mg  300 mg Oral 2 times per day Etta Quill, DO   300 mg at 11/14/20 2128  . gabapentin (NEURONTIN) capsule 600 mg  600 mg Oral Daily Jennette Kettle M, DO   600 mg at 11/15/20 7564  . heparin sodium (porcine) injection 1,000 Units  1,000 Units  Intracatheter Once Kerney Elbe, MD      . hydrALAZINE (APRESOLINE) tablet 50 mg  50 mg Oral Q8H Thurnell Lose, MD   50 mg at 11/15/20 0544  . insulin aspart (novoLOG) injection 0-15 Units  0-15 Units Subcutaneous TID WC Etta Quill, DO   5 Units at 11/15/20 1238  . insulin aspart (novoLOG) injection 8 Units  8 Units Subcutaneous TID WC Jonetta Osgood, MD   8 Units at 11/15/20 1237  . insulin glargine (LANTUS) injection 50 Units  50 Units Subcutaneous Daily Thurnell Lose, MD   50 Units at 11/15/20 [id number]  . lidocaine (PF) (XYLOCAINE) 1 % injection    PRN Corrie Mckusick, DO   5 mL at 11/10/20 1600  . LORazepam (ATIVAN) tablet 0.5 mg  0.5 mg Oral Q8H PRN Jonetta Osgood, MD   0.5 mg at 11/10/20 1406  . losartan (COZAAR) tablet 100 mg  100 mg Oral Daily Jennette Kettle M, DO   100 mg at 11/15/20 5188  . meloxicam (MOBIC) tablet 7.5 mg  7.5 mg Oral BID PRN Etta Quill, DO   7.5 mg at 11/06/20 2119  . methocarbamol (ROBAXIN) tablet 500 mg  500 mg Oral Q6H PRN Etta Quill, DO   500 mg at 11/14/20 2125  . multivitamin with minerals tablet 1 tablet  1 tablet Oral Daily Jonetta Osgood, MD   1 tablet at 11/15/20 [id number]  . nystatin (MYCOSTATIN/NYSTOP) topical powder   Topical BID Jonetta Osgood, MD   Given at 11/14/20 2128  . ondansetron (ZOFRAN) injection 4 mg  4 mg Intravenous Q6H PRN Etta Quill, DO      . polyethylene glycol (MIRALAX / GLYCOLAX) packet 17 g  17 g Oral Daily PRN Jonetta Osgood, MD   17 g at 11/06/20 2246  . pravastatin (PRAVACHOL) tablet 40 mg  40 mg Oral q1800 Etta Quill, DO   40 mg at 11/14/20 2013  . protein supplement (ENSURE MAX) liquid  11 oz Oral BID Jonetta Osgood, MD   11 oz at 11/15/20 1237  . pyridOXINE (VITAMIN B-6) tablet 100 mg  100 mg Oral Daily Thurnell Lose, MD   100 mg at 11/15/20 0823  . tamsulosin (FLOMAX) capsule 0.4 mg  0.4 mg Oral Daily Jennette Kettle M, DO   0.4  mg at 11/15/20 0824  . tobramycin (TOBREX) 0.3 %  ophthalmic ointment   Both Eyes TID Thurnell Lose, MD   Given at 11/15/20 1240  . traMADol (ULTRAM) tablet 50-100 mg  50-100 mg Oral Q8H PRN Etta Quill, DO   100 mg at 11/15/20 0350   No Known Allergies Social History   Socioeconomic History  . Marital status: Single    Spouse name: Not on file  . Number of children: 2  . Years of education: Not on file  . Highest education level: Not on file  Occupational History  . Not on file  Tobacco Use  . Smoking status: Former Smoker    Packs/day: 0.50    Types: Cigarettes    Quit date: 09/29/2020    Years since quitting: 0.1  . Smokeless tobacco: Never Used  Vaping Use  . Vaping Use: Never used  Substance and Sexual Activity  . Alcohol use: No    Alcohol/week: 0.0 standard drinks  . Drug use: Not Currently    Types: Methamphetamines  . Sexual activity: Yes  Other Topics Concern  . Not on file  Social History Narrative     Has 2 grown children.     Works as a Barrister's clerk.  Education: Oceanographer.   Right Handed   Drinks Caffeine    Mother recently moved and sold her house-- pt is living with a friend in a trailer    Social Determinants of Radio broadcast assistant Strain: Not on file  Food Insecurity: Not on file  Transportation Needs: Not on file  Physical Activity: Not on file  Stress: Not on file  Social Connections: Not on file  Intimate Partner Violence: Not on file    Review of systems: Review of Systems  Constitutional: Positive for malaise/fatigue. Negative for fever.  HENT: Negative.   Eyes: Positive for blurred vision and pain.  Respiratory: Negative.   Cardiovascular: Negative.   Gastrointestinal: Negative.   Genitourinary: Negative.   Musculoskeletal: Positive for falls and myalgias.  Skin: Negative.   Neurological: Positive for dizziness and weakness.  Endo/Heme/Allergies: Negative.   Psychiatric/Behavioral: Negative.     Physical Exam:  Blood pressure 119/71, pulse 94, temperature 97.7 F  (36.5 C), resp. rate 18, height 5\' 11"  (1.803 m), weight 132.5 kg, SpO2 99 %.   [place]:  OD 20/60       OS:  Count fingers at 4 feet  Pupils:   OD round, reactive to light, no APD            OS round, reactive to light, no APD  IOP (T pen)  OD 18  OS  22 (With pressure on lids to keep eye open  CVF: OD full to CF   OS full to CF  Motility:  OD full ductions  OS full ductions  Balance/alignment:  Ortho by Luiz Ochoa   Bedside examination:  Patient lying in bed on his left side. I woke him from sleep.                                 OD                                       External/adnexa: Normal  Lids/lashes:        Normal                                      Conjunctiva        White, quiet        Cornea:              Clear                  AC:                     Deep                               Iris:                     Normal        Lens:                  NS                                       OS                                       External/adnexa: Normal   What I can see of lacrimal gland looks normal                                   Lids/lashes:        2+ edema.  No erythema or warmth.  Skin intact and without rash                                     Conjunctiva        3+ Chemosis.  Minimal injection.  No mucus in tear film. We would not let me evert upper lid, but no FB seen with q-tip sweep of fornices      Cornea:              Clear.  No staining                  AC:                     Deep, no hypopyon                                Iris:                     Normal        Lens:                  NS       Dilated fundus exam: deffered  Labs/studies: Results for orders placed or performed during the hospital encounter of 11/04/20 (from the past 48 hour(s))  Glucose, capillary     Status: Abnormal   Collection Time: 11/13/20  4:28  PM  Result Value Ref Range   Glucose-Capillary 159 (H) 70 - 99 mg/dL    Comment: Glucose  reference range applies only to samples taken after fasting for at least 8 hours.  Glucose, capillary     Status: Abnormal   Collection Time: 11/13/20  9:17 PM  Result Value Ref Range   Glucose-Capillary 219 (H) 70 - 99 mg/dL    Comment: Glucose reference range applies only to samples taken after fasting for at least 8 hours.  Magnesium     Status: None   Collection Time: 11/14/20  2:51 AM  Result Value Ref Range   Magnesium 1.8 1.7 - 2.4 mg/dL    Comment: Performed at [facility], [place] [address] [place], [place] [address]  Comprehensive metabolic panel     Status: Abnormal   Collection Time: 11/14/20  2:51 AM  Result Value Ref Range   Sodium 133 (L) 135 - 145 mmol/L   Potassium 4.0 3.5 - 5.1 mmol/L   Chloride 104 98 - 111 mmol/L   CO2 23 22 - 32 mmol/L   Glucose, Bld 250 (H) 70 - 99 mg/dL    Comment: Glucose reference range applies only to samples taken after fasting for at least 8 hours.   BUN 17 6 - 20 mg/dL   Creatinine, Ser 0.83 0.61 - 1.24 mg/dL   Calcium 8.9 8.9 - 10.3 mg/dL   Total Protein 5.2 (L) 6.5 - 8.1 g/dL   Albumin 3.5 3.5 - 5.0 g/dL   AST 13 (L) 15 - 41 U/L   ALT 19 0 - 44 U/L   Alkaline Phosphatase 46 38 - 126 U/L   Total Bilirubin 0.2 (L) 0.3 - 1.2 mg/dL   GFR, Estimated >60 >60 mL/min    Comment: (NOTE) Calculated using the CKD-EPI Creatinine Equation (2021)    Anion gap 6 5 - 15    Comment: Performed at [facility], [place] [address] [place], [place] [address]  CBC with Differential/Platelet     Status: None   Collection Time: 11/14/20  2:51 AM  Result Value Ref Range   WBC 8.2 4.0 - 10.5 K/uL   RBC 4.39 4.22 - 5.81 MIL/uL   Hemoglobin 13.1 13.0 - 17.0 g/dL   HCT 40.2 39.0 - 52.0 %   MCV 91.6 80.0 - 100.0 fL   MCH 29.8 26.0 - 34.0 pg   MCHC 32.6 30.0 - 36.0 g/dL   RDW 12.5 11.5 - 15.5 %   Platelets 204 150 - 400 K/uL   nRBC 0.0 0.0 - 0.2 %   Neutrophils Relative % 59 %   Neutro Abs 4.8 1.7 - 7.7 K/uL   Lymphocytes Relative 24 %    Lymphs Abs 2.0 0.7 - 4.0 K/uL   Monocytes Relative 12 %   Monocytes Absolute 1.0 0.1 - 1.0 K/uL   Eosinophils Relative 3 %   Eosinophils Absolute 0.3 0.0 - 0.5 K/uL   Basophils Relative 1 %   Basophils Absolute 0.1 0.0 - 0.1 K/uL   Immature Granulocytes 1 %   Abs Immature Granulocytes 0.06 0.00 - 0.07 K/uL    Comment: Performed at [facility], [address] [place], [place] [address]  Procalcitonin - Baseline     Status: None   Collection Time: 11/14/20  2:51 AM  Result Value Ref Range   Procalcitonin <0.10 ng/mL    Comment:        Interpretation: PCT (Procalcitonin) <= 0.5 ng/mL: Systemic infection (  sepsis) is not likely. Local bacterial infection is possible. (NOTE)       Sepsis PCT Algorithm           Lower Respiratory Tract                                      Infection PCT Algorithm    ----------------------------     ----------------------------         PCT < 0.25 ng/mL                PCT < 0.10 ng/mL          Strongly encourage             Strongly discourage   discontinuation of antibiotics    initiation of antibiotics    ----------------------------     -----------------------------       PCT 0.25 - 0.50 ng/mL            PCT 0.10 - 0.25 ng/mL               OR       >80% decrease in PCT            Discourage initiation of                                            antibiotics      Encourage discontinuation           of antibiotics    ----------------------------     -----------------------------         PCT >= 0.50 ng/mL              PCT 0.26 - 0.50 ng/mL               AND        <80% decrease in PCT             Encourage initiation of                                             antibiotics       Encourage continuation           of antibiotics    ----------------------------     -----------------------------        PCT >= 0.50 ng/mL                  PCT > 0.50 ng/mL               AND         increase in PCT                  Strongly encourage                                       initiation of antibiotics    Strongly encourage escalation           of antibiotics                                     -----------------------------  PCT <= 0.25 ng/mL                                                 OR                                        > 80% decrease in PCT                                      Discontinue / Do not initiate                                             antibiotics  Performed at [facility], [place] [address] [place], [place] [address]   Glucose, capillary     Status: Abnormal   Collection Time: 11/14/20  7:42 AM  Result Value Ref Range   Glucose-Capillary 192 (H) 70 - 99 mg/dL    Comment: Glucose reference range applies only to samples taken after fasting for at least 8 hours.  Glucose, capillary     Status: Abnormal   Collection Time: 11/14/20 11:43 AM  Result Value Ref Range   Glucose-Capillary 221 (H) 70 - 99 mg/dL    Comment: Glucose reference range applies only to samples taken after fasting for at least 8 hours.  Basic metabolic panel     Status: Abnormal   Collection Time: 11/14/20  2:24 PM  Result Value Ref Range   Sodium 136 135 - 145 mmol/L   Potassium 3.9 3.5 - 5.1 mmol/L   Chloride 105 98 - 111 mmol/L   CO2 24 22 - 32 mmol/L   Glucose, Bld 232 (H) 70 - 99 mg/dL    Comment: Glucose reference range applies only to samples taken after fasting for at least 8 hours.   BUN 17 6 - 20 mg/dL   Creatinine, Ser 0.85 0.61 - 1.24 mg/dL   Calcium 8.9 8.9 - 10.3 mg/dL   GFR, Estimated >60 >60 mL/min    Comment: (NOTE) Calculated using the CKD-EPI Creatinine Equation (2021)    Anion gap 7 5 - 15    Comment: Performed at [facility] [address] [place], [place] [address]  Glucose, capillary     Status: Abnormal   Collection Time: 11/14/20  8:05 PM  Result Value Ref Range   Glucose-Capillary 178 (H) 70 - 99 mg/dL    Comment: Glucose reference range applies  only to samples taken after fasting for at least 8 hours.  Procalcitonin     Status: None   Collection Time: 11/15/20  5:14 AM  Result Value Ref Range   Procalcitonin 0.11 ng/mL    Comment:        Interpretation: PCT (Procalcitonin) <= 0.5 ng/mL: Systemic infection (sepsis) is not likely. Local bacterial infection is possible. (NOTE)       Sepsis PCT Algorithm           Lower Respiratory Tract  Infection PCT Algorithm    ----------------------------     ----------------------------         PCT < 0.25 ng/mL                PCT < 0.10 ng/mL          Strongly encourage             Strongly discourage   discontinuation of antibiotics    initiation of antibiotics    ----------------------------     -----------------------------       PCT 0.25 - 0.50 ng/mL            PCT 0.10 - 0.25 ng/mL               OR       >80% decrease in PCT            Discourage initiation of                                            antibiotics      Encourage discontinuation           of antibiotics    ----------------------------     -----------------------------         PCT >= 0.50 ng/mL              PCT 0.26 - 0.50 ng/mL               AND        <80% decrease in PCT             Encourage initiation of                                             antibiotics       Encourage continuation           of antibiotics    ----------------------------     -----------------------------        PCT >= 0.50 ng/mL                  PCT > 0.50 ng/mL               AND         increase in PCT                  Strongly encourage                                      initiation of antibiotics    Strongly encourage escalation           of antibiotics                                     -----------------------------                                           PCT <= 0.25 ng/mL  OR                                        > 80% decrease in PCT                                       Discontinue / Do not initiate                                             antibiotics  Performed at [facility], [place] [address], [place], [place] [address]   Magnesium     Status: None   Collection Time: 11/15/20  5:14 AM  Result Value Ref Range   Magnesium 1.7 1.7 - 2.4 mg/dL    Comment: Performed at [facility] [address], [place], [place] [address]  CBC with Differential/Platelet     Status: Abnormal   Collection Time: 11/15/20  5:14 AM  Result Value Ref Range   WBC 8.5 4.0 - 10.5 K/uL   RBC 4.19 (L) 4.22 - 5.81 MIL/uL   Hemoglobin 12.6 (L) 13.0 - 17.0 g/dL   HCT 37.7 (L) 39.0 - 52.0 %   MCV 90.0 80.0 - 100.0 fL   MCH 30.1 26.0 - 34.0 pg   MCHC 33.4 30.0 - 36.0 g/dL   RDW 12.4 11.5 - 15.5 %   Platelets 208 150 - 400 K/uL   nRBC 0.0 0.0 - 0.2 %   Neutrophils Relative % 67 %   Neutro Abs 5.8 1.7 - 7.7 K/uL   Lymphocytes Relative 17 %   Lymphs Abs 1.4 0.7 - 4.0 K/uL   Monocytes Relative 12 %   Monocytes Absolute 1.0 0.1 - 1.0 K/uL   Eosinophils Relative 2 %   Eosinophils Absolute 0.2 0.0 - 0.5 K/uL   Basophils Relative 1 %   Basophils Absolute 0.0 0.0 - 0.1 K/uL   Immature Granulocytes 1 %   Abs Immature Granulocytes 0.05 0.00 - 0.07 K/uL    Comment: Performed at [facility] [address] [place], [place] [address]  Comprehensive metabolic panel     Status: Abnormal   Collection Time: 11/15/20  5:14 AM  Result Value Ref Range   Sodium 135 135 - 145 mmol/L   Potassium 4.2 3.5 - 5.1 mmol/L   Chloride 106 98 - 111 mmol/L   CO2 24 22 - 32 mmol/L   Glucose, Bld 196 (H) 70 - 99 mg/dL    Comment: Glucose reference range applies only to samples taken after fasting for at least 8 hours.   BUN 18 6 - 20 mg/dL   Creatinine, Ser 0.87 0.61 - 1.24 mg/dL   Calcium 9.0 8.9 - 10.3 mg/dL   Total Protein 4.9 (L) 6.5 - 8.1 g/dL   Albumin 3.8 3.5 - 5.0 g/dL   AST 10 (L) 15 - 41 U/L   ALT 13 0 - 44 U/L   Alkaline Phosphatase  32 (L) 38 - 126 U/L   Total Bilirubin 0.7 0.3 - 1.2 mg/dL   GFR, Estimated >60 >60 mL/min    Comment: (NOTE) Calculated using the CKD-EPI Creatinine Equation (2021)    Anion gap 5 5 -  15    Comment: Performed at [facility], [place] [address], [place], [place] [address]  Glucose, capillary     Status: Abnormal   Collection Time: 11/15/20  6:47 AM  Result Value Ref Range   Glucose-Capillary 177 (H) 70 - 99 mg/dL    Comment: Glucose reference range applies only to samples taken after fasting for at least 8 hours.  Glucose, capillary     Status: Abnormal   Collection Time: 11/15/20 12:12 PM  Result Value Ref Range   Glucose-Capillary 209 (H) 70 - 99 mg/dL    Comment: Glucose reference range applies only to samples taken after fasting for at least 8 hours.   [place] RT UPPER EXTREM LTD SOFT TISSUE NON VASCULAR  Result Date: 11/14/2020 CLINICAL DATA:  Right forearm swelling and redness in the area of recent IV access. EXAM: ULTRASOUND RIGHT UPPER EXTREMITY LIMITED TECHNIQUE: Ultrasound examination of the upper extremity soft tissues was performed in the area of clinical concern. COMPARISON:  None. FINDINGS: Focused ultrasound of the right forearm demonstrates prominent soft tissue swelling with hyperemia. Superficial tubular structure without internal flow deep to the subcutaneous edema concerning for superficial venous thrombosis. IMPRESSION: 1. Findings concerning for superficial venous thrombosis in the right forearm. Recommend dedicated right upper extremity duplex venous ultrasound. Electronically Signed   By: Titus Dubin M.D.   On: 11/14/2020 16:20                             Assessment and Plan: Left eye pain and chemosis. Improving.  I think patient likely had a piece of dust or lash in his eye yesterday.  It likely got rinsed out with all of the tearing he had.  I think his chemosis is from rubbing the eye  (possibly in his sleep) and from lying on that side.  Since his swelling has  markedly improved since waking, I doubt it is anything serious.  No sign of infection.  No orbital signs.  Vision wil likely improve as chemosis resolves and tear film stabilizes.    Call me if pain or vision worsen.    PATIENT TO FOLLOW UP WITH HIS USUAL EYE CARE PROVIDER AND [facility].    Follow up contact information was provided.  All questions were answered.   Aynor L 11/15/2020, 1:03 PM  [facility]hthalmology [phone]

## 2020-11-15 NOTE — Progress Notes (Signed)
Inpatient Rehab Admissions Coordinator:   Consult received.  Note PT recommending home health f/u and OT recommend SNF f/u.  Would not be able to get authorization from [facility] for CIR with these recommendations.  Also note has been refusing some medical treatment.  Will follow from a distance.    Shann Medal, PT, DPT Admissions Coordinator [phone] 11/15/20  11:27 AM

## 2020-11-15 NOTE — Progress Notes (Signed)
PROGRESS NOTE        PATIENT DETAILS Name: Tyler Brewer Age: 60 y.o. Sex: male Date of Birth: [birth year] Admit Date: 11/04/2020 Admitting Physician Etta Quill, DO [id number] Koren Shiver, DO  Brief Narrative: Patient is a 60 y.o. male with history of DM-2, CAD, OSA-presenting to the hospital with frequent falls-patient has had progressive bilateral lower extremity weakness since August 2021.  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  See below for further details  Significant events: 3/18>> admit for lower extremity weakness 3/25>> PLEX  started  Significant studies: 2/23>> MRI thoracic spine: No acute abnormalities noted. 3/19>> MRI lumbar sacral spine: No acute process identified. 3/19>> MRI C-spine: Small disc herniation at C3-C4, C4-C5-no abnormal cord signal. 3/19>> vitamin B12: 538 3/19>> HIV: Nonreactive 3/19>> TSH: Normal limits 3/19>> vitamin B6: Pending 3/19>> HSV 1/HSV 2 DNA by PCR blood: Negative 3/19>> CMV DNA by PCR blood>> negative 3/20>> MRI T-spine with contrast: Normal-appearing thoracic cord 3/20>> MRI C-spine: Normal-appearing cervical cord-no pathologic enhancement. 3/23>> CSF: WBC 3, protein 124 3/23>> arbovirus panel- CSF: Pending   Antimicrobial therapy: None  Microbiology data: 3/23>> CSF culture: No growth  Procedures : 3/23>> fluoroscopy-guided lumbar puncture  Consults: Neurology  DVT Prophylaxis : Prophylactic Lovenox   Subjective:  Patient in bed, appears comfortable, denies any headache, no fever, no chest pain or pressure, no shortness of breath , no abdominal pain. R. arm pain better, now L eye swelling with some discharge.    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  Continue PT-OT May require placement. DW Neuro 11/15/20.  Right arm with previous IV site redness and possible abscess.  Soft tissue ultrasound stable, obtaining venous duplex, continue with Warm compress and oral doxycycline.  Monitor.  Left eye swelling with discharge. ? conjunctivitis vs Stye Tobramycin ointment, Dr. Ellie Lunch consulted.  noncompliance with medical recommendations.  Patient counseled and warned of consequences, he understands and assumes all responsibility of any adverse outcome.  Peripheral diabetic nephropathy: Continue Neurontin/Mobic and Cymbalta.  HTN: On combination of losartan, Norvasc and have added hydralazine, monitor.  HLD: Continue statin  BPH: Continue Flomax  Hyponatremia: Appears to be mild-and chronic-continue to follow periodically.  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  Obesity: Estimated body mass index is 40.74 kg/m as calculated from the following:   Height as of this encounter: 5\' 11"  (1.803 m).   Weight as of this encounter: 132.5 kg.   DM-2 (A1c 10.6 on 3/19): On Lantus along with premeal NovoLog and sliding scale, Lantus increased for better control on 11/13/2020.  Poor outpatient control due to hyperglycemia.  CBG (last 3)  Recent Labs    11/14/20 1143 11/14/20 2005 11/15/20 0647  GLUCAP 221* 178* 177*     Diet: Diet Order            Diet Carb Modified Fluid consistency: Thin; Room service appropriate? Yes  Diet effective now                  Code Status: Full code   Family Communication:  Mother [name] [phone] on 11/12/20  Disposition Plan: Status is: Inpatient  The patient will  require care spanning > 2 midnights and should be moved to inpatient because: Inpatient level of care appropriate due to severity of illness  Dispo: The patient is from: Home              Anticipated d/c is to: To be determined              Patient currently is not  medically stable to d/c.   Difficult to place patient No    Barriers to Discharge: CIDP-starting PLEX 3/25  Antimicrobial agents: Anti-infectives (From admission, onward)   Start     Dose/Rate Route Frequency Ordered Stop   11/14/20 2100  vancomycin (VANCOREADY) IVPB 1500 mg/300 mL  Status:  Discontinued        1,500 mg 150 mL/hr over 120 Minutes Intravenous Every 12 hours 11/14/20 0757 11/14/20 0927   11/14/20 1000  doxycycline (VIBRA-TABS) tablet 100 mg        100 mg Oral Every 12 hours 11/14/20 0907     11/14/20 0845  vancomycin (VANCOREADY) IVPB 2000 mg/400 mL  Status:  Discontinued        2,000 mg 200 mL/hr over 120 Minutes Intravenous  Once 11/14/20 0757 11/14/20 0927   11/13/20 1030  fluconazole (DIFLUCAN) tablet 100 mg        100 mg Oral Daily 11/13/20 0941 11/20/20 0959       Time spent: 25- minutes-Greater than 50% of this time was spent in counseling, explanation of diagnosis, planning of further management, and coordination of care.  MEDICATIONS: Scheduled Meds: . amLODipine  5 mg Oral Daily  . Chlorhexidine Gluconate Cloth  6 each Topical Daily  . doxycycline  100 mg Oral Q12H  . DULoxetine  30 mg Oral Once per day on Mon Wed Fri  . enoxaparin (LOVENOX) injection  60 mg Subcutaneous Q24H  . fluconazole  100 mg Oral Daily  . gabapentin  300 mg Oral 2 times per day  . gabapentin  600 mg Oral Daily  . heparin sodium (porcine)  1,000 Units Intracatheter Once  . hydrALAZINE  50 mg Oral Q8H  . insulin aspart  0-15 Units Subcutaneous TID WC  . insulin aspart  8 Units Subcutaneous TID WC  . insulin glargine  50 Units Subcutaneous Daily  . losartan  100 mg Oral Daily  . multivitamin with minerals  1 tablet Oral Daily  . nystatin   Topical BID  . pravastatin  40 mg Oral q1800  . Ensure Max Protein  11 oz Oral BID  . vitamin B-6  100 mg Oral Daily  . tamsulosin  0.4 mg Oral Daily  . tobramycin   Both Eyes TID   Continuous Infusions: . citrate dextrose    .  citrate dextrose     PRN Meds:.acetaminophen **OR** acetaminophen, acetaminophen, diclofenac Sodium, diphenhydrAMINE, lidocaine (PF), LORazepam, meloxicam, methocarbamol, [DISCONTINUED] ondansetron **OR** ondansetron (ZOFRAN) IV, polyethylene glycol, traMADol   PHYSICAL EXAM: Vital signs: Vitals:   11/14/20 1815 11/14/20 2002 11/15/20 0524 11/15/20 0830  BP: 90/67 136/77 (!) 154/93 119/71  Pulse: 82 91 90 94  Resp: 14 18 20 18   Temp: 98.1 F (36.7 C) 98.1 F (36.7 C) (!) 97.5 F (36.4 C) 97.7 F (36.5 C)  TempSrc: Oral  Oral   SpO2:  100% 100% 99%  Weight:      Height:       Filed Weights   11/04/20 1409 11/12/20 2016  Weight: 124.7 kg 132.5 kg   Body mass index is 40.74 kg/m.  Gen Exam:  Awake Alert, strength in upper extremities 5/5, right lower extremity 4/5, left lower leg 3/5, R forearm IV site under bandage, left eye massively swollen both upper, mild conjunctival injection with some discharge [place].AT,  Supple Neck,No JVD, No cervical lymphadenopathy appriciated.  Symmetrical Chest wall movement, Good air movement bilaterally, CTAB RRR,No Gallops, Rubs or new Murmurs, No Parasternal Heave +ve B.Sounds, Abd Soft, No tenderness, No organomegaly appriciated, No rebound - guarding or rigidity. No Cyanosis, Clubbing or edema, No new Rash or bruise    I have personally reviewed following labs and imaging studies  LABORATORY DATA: CBC: Recent Labs  Lab 11/11/20 0714 11/11/20 0935 11/12/20 0346 11/13/20 0250 11/14/20 0251 11/15/20 0514  WBC 7.3  --  9.2 8.1 8.2 8.5  NEUTROABS  --   --   --  5.2 4.8 5.8  HGB 14.4 13.6 13.7 13.4 13.1 12.6*  HCT 42.8 40.0 39.1 39.2 40.2 37.7*  MCV 89.5  --  87.1 89.1 91.6 90.0  PLT 255  --  248 211 204 488    Basic Metabolic Panel: Recent Labs  Lab 11/12/20 0219 11/13/20 0250 11/14/20 0251 11/14/20 1424 11/15/20 0514  NA 134* 135 133* 136 135  K 4.2 4.3 4.0 3.9 4.2  CL 105 105 104 105 106  CO2 22 26 23 24 24   GLUCOSE  251* 309* 250* 232* 196*  BUN 20 16 17 17 18   CREATININE 0.85 0.88 0.83 0.85 0.87  CALCIUM 8.7* 9.1 8.9 8.9 9.0  MG  --  1.7 1.8  --  1.7    GFR: Estimated Creatinine Clearance: 130.1 mL/min (by C-G formula based on SCr of 0.87 mg/dL).  Liver Function Tests: Recent Labs  Lab 11/09/20 0938 11/12/20 0219 11/13/20 0250 11/14/20 0251 11/15/20 0514  AST  --  16 11* 13* 10*  ALT  --  19 16 19 13   ALKPHOS  --  55 45 46 32*  BILITOT  --  0.5 0.7 0.2* 0.7  PROT  --  5.1* 5.0* 5.2* 4.9*  ALBUMIN 3.3* 3.5 3.7 3.5 3.8   No results for input(s): LIPASE, AMYLASE in the last 168 hours. No results for input(s): AMMONIA in the last 168 hours.  Coagulation Profile: No results for input(s): INR, PROTIME in the last 168 hours.  Cardiac Enzymes: No results for input(s): CKTOTAL, CKMB, CKMBINDEX, TROPONINI in the last 168 hours.  BNP (last 3 results) No results for input(s): PROBNP in the last 8760 hours.  Lipid Profile: No results for input(s): CHOL, HDL, LDLCALC, TRIG, CHOLHDL, LDLDIRECT in the last 72 hours.  Thyroid Function Tests: No results for input(s): TSH, T4TOTAL, FREET4, T3FREE, THYROIDAB in the last 72 hours.  Anemia Panel: No results for input(s): VITAMINB12, FOLATE, FERRITIN, TIBC, IRON, RETICCTPCT in the last 72 hours.  Urine analysis:    Component Value Date/Time   COLORURINE YELLOW 11/05/2020 2029   APPEARANCEUR HAZY (A) 11/05/2020 2029   LABSPEC 1.028 11/05/2020 2029   PHURINE 5.0 11/05/2020 2029   GLUCOSEU >=500 (A) 11/05/2020 2029   GLUCOSEU 100 (A) 09/25/2016 1041   HGBUR NEGATIVE 11/05/2020 2029   BILIRUBINUR NEGATIVE 11/05/2020 2029   BILIRUBINUR neg 04/26/2016 0836   KETONESUR NEGATIVE 11/05/2020 2029   PROTEINUR NEGATIVE 11/05/2020 2029   UROBILINOGEN 0.2 09/25/2016 1041   NITRITE NEGATIVE 11/05/2020 2029   LEUKOCYTESUR NEGATIVE 11/05/2020 2029    Sepsis Labs: Lactic Acid, Venous    Component Value Date/Time   LATICACIDVEN 1.3 01/21/2020 0915     MICROBIOLOGY:  Recent Results (from the past 240 hour(s))  CSF culture w Gram Stain     Status: None   Collection Time: 11/09/20  9:38 AM   Specimen: PATH Cytology CSF; Cerebrospinal Fluid  Result Value Ref Range Status   Specimen Description CSF  Final   Special Requests NONE  Final   Gram Stain   Final    WBC PRESENT, PREDOMINANTLY MONONUCLEAR NO ORGANISMS SEEN CYTOSPIN SMEAR    Culture   Final    NO GROWTH Performed at [facility], [address], [place], [place] [id number]    Report Status 11/12/2020 FINAL  Final    RADIOLOGY STUDIES/RESULTS: [place] RT UPPER EXTREM LTD SOFT TISSUE NON VASCULAR  Result Date: 11/14/2020 CLINICAL DATA:  Right forearm swelling and redness in the area of recent IV access. EXAM: ULTRASOUND RIGHT UPPER EXTREMITY LIMITED TECHNIQUE: Ultrasound examination of the upper extremity soft tissues was performed in the area of clinical concern. COMPARISON:  None. FINDINGS: Focused ultrasound of the right forearm demonstrates prominent soft tissue swelling with hyperemia. Superficial tubular structure without internal flow deep to the subcutaneous edema concerning for superficial venous thrombosis. IMPRESSION: 1. Findings concerning for superficial venous thrombosis in the right forearm. Recommend dedicated right upper extremity duplex venous ultrasound. Electronically Signed   By: Titus Dubin M.D.   On: 11/14/2020 16:20     LOS: 9 days   Lala Lund, MD  [facility]  11/15/2020, 10:58 AM

## 2020-11-15 NOTE — Consult Note (Signed)
Physical Medicine and Rehabilitation Consult   Reason for Consult:  Functional deficits.  Referring Physician: Dr. Candiss Norse.    HPI: Tyler Brewer is a 60 y.o.R handed male with history of CAD, T2DM- poorly controlled with neuropathy, h/o Methamphetamine use--quit 2 months ago, obesity who has had issues with progressive weakness LLE>RLE and falls since last August. Neuro evaluation was negative for compressive pathology and EMG/NCS not completed due to pain. 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 LP done revealing albuminocytologic dissociation and Dr. Cheral Marker recommended PLEX X 9 courses which was initiated on 03/25. Hospital course significant for right arm cellulitis v/s abscess and IV vanc recommended but patient declined having IV placed--->on doxycycline with local measures.  He developed left eye swelling and edema with irritation-->tobrex added by Dr. Ellie Lunch. He has been responding to PLEX with improvement in strength but continues to be deconditioned.  Therapy ongoing and patient continues to be limited by weakness with sensory deficits, deconditioning and decreased posture. SNF recommended for follow up therapy but patient requesting CIR for more intensive program.    Last night at 3am was crying due to feeling like scratched L cornea- cannot open L eye and keeping R eye also close-d it's easier.  L foot drop- it catches Also R forearm hurts from previous IV.  PLEX mades him very fatigued- LBM yesterday- usually daily.  Was having bladder accidents prior to hospital admission. , but doing better with voiding- was also having retention- still doing better.    Review of Systems  Constitutional: Negative for chills and fever.  HENT: Negative for hearing loss.   Eyes: Positive for blurred vision (due to inflammation).  Respiratory: Negative for cough and shortness of breath.    Cardiovascular: Positive for leg swelling. Negative for chest pain.  Gastrointestinal: Negative for constipation, heartburn and vomiting.  Genitourinary: Negative for dysuria.  Musculoskeletal: Positive for myalgias.  Skin: Negative for rash.  Neurological: Positive for sensory change, focal weakness and weakness. Negative for dizziness and headaches.  Psychiatric/Behavioral: Negative for memory loss. The patient is not nervous/anxious.   All other systems reviewed and are negative.    Past Medical History:  Diagnosis Date  . Arthritis   . CAD (coronary artery disease)  cardiac cath with moderate disease in a septal branch of the ramus intermedius 04/01/2012  . Depression   . Diabetes mellitus    poorly controlled by his report  . History of narcotic addiction ([place])    past history of back pain  . Hypercholesteremia   . Hypertension   . IBS (irritable bowel syndrome)   . 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 Neuropathy   . Obesity    Max weight was 390  . OSA on CPAP   . Panic attacks   . Testosterone deficiency   . Vertigo     Past Surgical History:  Procedure Laterality Date  . CARDIAC CATHETERIZATION    . IR FLUORO GUIDE CV LINE RIGHT  11/10/2020  . IR US GUIDE VASC ACCESS RIGHT  11/10/2020  . LEFT HEART CATHETERIZATION WITH CORONARY ANGIOGRAM N/A 03/31/2012   Procedure: LEFT HEART CATHETERIZATION WITH CORONARY ANGIOGRAM;  Surgeon: Leonie Man, MD;  Location: [facility] CATH LAB;  Service: Cardiovascular;  Laterality: N/A;    Family History  Problem Relation Age of Onset  . Diabetes type II Father   . Hypertension Father   . Pancreatic  disease Father 70       Deceased  . Healthy Mother   . Healthy Sister   . Healthy Son   . Healthy Daughter   . Parkinson's disease Maternal Grandmother     Social History:  Lives with a friend (who works days). He reports that he quit smoking about 6 weeks ago. His smoking use included cigarettes. He smoked 0.50 packs per day. He has  never used smokeless tobacco. He reports previous drug use. Drug: Methamphetamines. He reports that he does not drink alcohol.    Allergies: No Known Allergies    Medications Prior to Admission  Medication Sig Dispense Refill  . Chlorpheniramine Maleate (ALLERGY RELIEF PO) Take 1 tablet by mouth daily as needed (allergies).    . diclofenac Sodium (VOLTAREN) 1 % GEL Apply 2 g topically 4 (four) times daily. (Patient taking differently: Apply 2 g topically 4 (four) times daily as needed (pain).) 50 g 0  . DULoxetine (CYMBALTA) 30 MG capsule Take 1 capsule (30 mg total) by mouth daily. (Patient taking differently: Take 30 mg by mouth 3 (three) times a week.) 60 capsule 3  . gabapentin (NEURONTIN) 300 MG capsule Take 2 tablets in the morning, 1 tablet in the afternoon, and 1 tablet at bedtime. (Patient taking differently: Take 300-600 mg by mouth See admin instructions. Take 600 mg in the morning, 300 mg in the afternoon and at bedtime) 120 capsule 5  . HYDROcodone-acetaminophen (NORCO/VICODIN) 5-325 MG tablet Take 1 tablet by mouth every 4 (four) hours as needed. (Patient taking differently: Take 1 tablet by mouth every 4 (four) hours as needed for moderate pain.) 10 tablet 0  . ibuprofen (ADVIL) 200 MG tablet Take 400-600 mg by mouth every 6 (six) hours as needed for headache or mild pain.    [name] Kitchen insulin aspart (NOVOLOG) 100 UNIT/ML injection Inject 10 Units into the skin 3 (three) times daily with meals. 10 mL PRN  . insulin degludec (TRESIBA FLEXTOUCH) 100 UNIT/ML FlexTouch Pen Inject 40 Units into the skin daily. 3 mL 3  . losartan (COZAAR) 100 MG tablet Take 1 tablet (100 mg total) by mouth daily. 90 tablet 3  . meloxicam (MOBIC) 7.5 MG tablet Take 1 tablet (7.5 mg total) by mouth 2 (two) times daily as needed for pain. 30 tablet 2  . methocarbamol (ROBAXIN) 500 MG tablet Take 1 tablet (500 mg total) by mouth 4 (four) times daily. (Patient taking differently: Take 500 mg by mouth every 6 (six)  hours as needed for muscle spasms.) 45 tablet 1  . naproxen sodium (ALEVE) 220 MG tablet Take 440-660 mg by mouth 2 (two) times daily as needed (pain/headache).    . pravastatin (PRAVACHOL) 40 MG tablet Take 1 tablet (40 mg total) by mouth daily. 30 tablet 4  . tamsulosin (FLOMAX) 0.4 MG CAPS capsule Take 1 capsule (0.4 mg total) by mouth daily. 90 capsule 3  . traMADol (ULTRAM) 50 MG tablet Take 1-2 tablets by mouth every 8 hours as needed for pain. (Patient taking differently: Take 50-100 mg by mouth every 8 (eight) hours as needed for moderate pain.) 30 tablet 0    Home: Home Living Family/patient expects to be discharged to:: Private residence Living Arrangements: Non-relatives/Friends Available Help at Discharge: Friend(s),Available PRN/intermittently Type of Home: Mobile home Home Access: Stairs to enter Entrance Stairs-Number of Steps: 3 Entrance Stairs-Rails: Left Home Layout: One level Bathroom Shower/Tub: Chiropodist: Standard Home Equipment: Walker - 2 wheels,Cane - quad,Cane - single point  Functional History: Prior Function Level of Independence: Independent with assistive device(s) Comments: Pt reports he is mod I with ADLs.  He has stopped driving.  He ambulates with SPC in his home, but has had multiple falls.  He reports meal prep is challenging as is getting in and out of bathtub to shower. Functional Status:  Mobility: Bed Mobility Overal bed mobility: Needs Assistance Bed Mobility: Supine to Sit Rolling: Modified independent (Device/Increase time) Sidelying to sit: Min assist Supine to sit: Modified independent (Device/Increase time),HOB elevated Sit to supine: Supervision General bed mobility comments: no physical assist needed, increased time and use of bed features Transfers Overall transfer level: Needs assistance Equipment used: Rolling walker (2 wheeled) Transfers: Sit to/from Stand Sit to Stand: Min guard Stand pivot transfers: Min  guard General transfer comment: preferred elevated HOB, min guard to rise for safety from EOB, use of SPC to power up and one hand on EOB Ambulation/Gait Ambulation/Gait assistance: Min guard (without physical assist) Gait Distance (Feet): 30 Feet Assistive device: Rolling walker (2 wheeled) (and shoes with L AFO) Gait Pattern/deviations: Step-through pattern,Decreased step length - right,Decreased stride length,Decreased dorsiflexion - right,Decreased dorsiflexion - left,Trunk flexed General Gait Details: Encouragement to put on socks and shoes and use the L AFO today; He endorsed feeling tired and weak, and so opted for closer guard than recent sessions; Pt agreed to using the RW without much dissent. Gait velocity: Decreased Gait velocity interpretation: <1.31 ft/sec, indicative of household ambulator    ADL: ADL Overall ADL's : Needs assistance/impaired Eating/Feeding: Independent Grooming: Wash/dry hands,Wash/dry face,Oral care,Supervision/safety,Sitting Upper Body Bathing: Set up,Sitting Lower Body Bathing: Min guard,Sit to/from stand Upper Body Dressing : Set up,Supervision/safety,Sitting Lower Body Dressing: Total assistance,Sit to/from stand Lower Body Dressing Details (indicate cue type and reason): to don socks and shoes and L AFO Toilet Transfer: Min Careers information officer Details (indicate cue type and reason): simulated via functional mobility with SPC Toileting- Clothing Manipulation and Hygiene: Min guard,Sit to/from stand Functional mobility during ADLs: Min guard (SPC) General ADL Comments: pt continues to present with imparied safety awareness, tangential speech and generalized deconditioning ( LLE>RLE)  Cognition: Cognition Overall Cognitive Status: No family/caregiver present to determine baseline cognitive functioning Orientation Level: Oriented X4 Cognition Arousal/Alertness: Awake/alert Behavior During Therapy: WFL for tasks  assessed/performed Overall Cognitive Status: No family/caregiver present to determine baseline cognitive functioning General Comments: tangential at times   Blood pressure 119/71, pulse 94, temperature 97.7 F (36.5 C), resp. rate 18, height 5\' 11"  (1.803 m), weight 132.5 kg, SpO2 99 %. Physical Exam Vitals and nursing note reviewed.  Constitutional:      Appearance: He is obese.     Comments: Pt overweight, kept L and R eye closed, awake- sitting EOB, overall appropriate, NAD  HENT:     Head: Normocephalic and atraumatic.     Comments: Smile equal    Right Ear: External ear normal.     Left Ear: External ear normal.     Ears:     Comments: Left eye edematous with light sensitivity--kept eyes closed for most of exam.     Nose: Nose normal. No congestion.     Mouth/Throat:     Mouth: Mucous membranes are moist.     Pharynx: Oropharynx is clear. No oropharyngeal exudate.  Eyes:     General:        Left eye: Discharge present.    Comments: L eye draining/tearing- appears MUCH more swollen/puffy/bulging with eye closed than R side- would not/could not  open for me to see.  Sclera mildly erythematous at edges.   Neck:     Comments: R neck CVL Cardiovascular:     Rate and Rhythm: Normal rate and regular rhythm.     Heart sounds: Normal heart sounds. No murmur heard. No gallop.   Pulmonary:     Comments: CTA B/L- no W/R/R- good air movement Abdominal:     Comments: Soft, NT, ND, (+)BS - hypoactive  Musculoskeletal:        General: Swelling (2+ pedal edema Left>right) present.     Cervical back: Normal range of motion and neck supple.     Comments: R forearm swollen- slightly reddened UEs 5-/5 in biceps, triceps, WE, grip and finger abd B/L RLE- JF 4+/5, KE 4/5, DF 4+/5, PF 5-/5 LLE- HF 1/5, KE 2/5, DF 0/5, PF 3-/5   Skin:    General: Skin is warm and dry.     Comments: R forearm slightly swollen as above- has hotpack wrapped with ACE wrap on R forearm   Neurological:      Mental Status: He is alert and oriented to person, place, and time.     Comments: Speech clear. Alert and appropriate. Able to follow commands without difficulty. Left foot drop.  Severe L foot drop Able ot stand with 4 pronged cane, however very unsteady  Psychiatric:     Comments: Appropriate, interactive     Results for orders placed or performed during the hospital encounter of 11/04/20 (from the past 24 hour(s))  Basic metabolic panel     Status: Abnormal   Collection Time: 11/14/20  2:24 PM  Result Value Ref Range   Sodium 136 135 - 145 mmol/L   Potassium 3.9 3.5 - 5.1 mmol/L   Chloride 105 98 - 111 mmol/L   CO2 24 22 - 32 mmol/L   Glucose, Bld 232 (H) 70 - 99 mg/dL   BUN 17 6 - 20 mg/dL   Creatinine, Ser 0.85 0.61 - 1.24 mg/dL   Calcium 8.9 8.9 - 10.3 mg/dL   GFR, Estimated >60 >60 mL/min   Anion gap 7 5 - 15  Glucose, capillary     Status: Abnormal   Collection Time: 11/14/20  8:05 PM  Result Value Ref Range   Glucose-Capillary 178 (H) 70 - 99 mg/dL  Procalcitonin     Status: None   Collection Time: 11/15/20  5:14 AM  Result Value Ref Range   Procalcitonin 0.11 ng/mL  Magnesium     Status: None   Collection Time: 11/15/20  5:14 AM  Result Value Ref Range   Magnesium 1.7 1.7 - 2.4 mg/dL  CBC with Differential/Platelet     Status: Abnormal   Collection Time: 11/15/20  5:14 AM  Result Value Ref Range   WBC 8.5 4.0 - 10.5 K/uL   RBC 4.19 (L) 4.22 - 5.81 MIL/uL   Hemoglobin 12.6 (L) 13.0 - 17.0 g/dL   HCT 37.7 (L) 39.0 - 52.0 %   MCV 90.0 80.0 - 100.0 fL   MCH 30.1 26.0 - 34.0 pg   MCHC 33.4 30.0 - 36.0 g/dL   RDW 12.4 11.5 - 15.5 %   Platelets 208 150 - 400 K/uL   nRBC 0.0 0.0 - 0.2 %   Neutrophils Relative % 67 %   Neutro Abs 5.8 1.7 - 7.7 K/uL   Lymphocytes Relative 17 %   Lymphs Abs 1.4 0.7 - 4.0 K/uL   Monocytes Relative 12 %   Monocytes Absolute 1.0 0.1 -  1.0 K/uL   Eosinophils Relative 2 %   Eosinophils Absolute 0.2 0.0 - 0.5 K/uL   Basophils  Relative 1 %   Basophils Absolute 0.0 0.0 - 0.1 K/uL   Immature Granulocytes 1 %   Abs Immature Granulocytes 0.05 0.00 - 0.07 K/uL  Comprehensive metabolic panel     Status: Abnormal   Collection Time: 11/15/20  5:14 AM  Result Value Ref Range   Sodium 135 135 - 145 mmol/L   Potassium 4.2 3.5 - 5.1 mmol/L   Chloride 106 98 - 111 mmol/L   CO2 24 22 - 32 mmol/L   Glucose, Bld 196 (H) 70 - 99 mg/dL   BUN 18 6 - 20 mg/dL   Creatinine, Ser 0.87 0.61 - 1.24 mg/dL   Calcium 9.0 8.9 - 10.3 mg/dL   Total Protein 4.9 (L) 6.5 - 8.1 g/dL   Albumin 3.8 3.5 - 5.0 g/dL   AST 10 (L) 15 - 41 U/L   ALT 13 0 - 44 U/L   Alkaline Phosphatase 32 (L) 38 - 126 U/L   Total Bilirubin 0.7 0.3 - 1.2 mg/dL   GFR, Estimated >60 >60 mL/min   Anion gap 5 5 - 15  Glucose, capillary     Status: Abnormal   Collection Time: 11/15/20  6:47 AM  Result Value Ref Range   Glucose-Capillary 177 (H) 70 - 99 mg/dL  Glucose, capillary     Status: Abnormal   Collection Time: 11/15/20 12:12 PM  Result Value Ref Range   Glucose-Capillary 209 (H) 70 - 99 mg/dL   [place] RT UPPER EXTREM LTD SOFT TISSUE NON VASCULAR  Result Date: 11/14/2020 CLINICAL DATA:  Right forearm swelling and redness in the area of recent IV access. EXAM: ULTRASOUND RIGHT UPPER EXTREMITY LIMITED TECHNIQUE: Ultrasound examination of the upper extremity soft tissues was performed in the area of clinical concern. COMPARISON:  None. FINDINGS: Focused ultrasound of the right forearm demonstrates prominent soft tissue swelling with hyperemia. Superficial tubular structure without internal flow deep to the subcutaneous edema concerning for superficial venous thrombosis. IMPRESSION: 1. Findings concerning for superficial venous thrombosis in the right forearm. Recommend dedicated right upper extremity duplex venous ultrasound. Electronically Signed   By: Titus Dubin M.D.   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 Does the need for close, 24 hr/day medical supervision in concert with the patient's rehab needs make it unreasonable for this patient to be served in a less intensive setting? Yes 2. Co-Morbidities requiring supervision/potential complications: Neurogenic bowel and bladder, recurrent falls, CIDP, BMI 40+, DM with A1c of 10.6, hx of opaite/meth addiction- Sober; depression, panic attacks 3. Due to bladder management, bowel management, safety, skin/wound care, disease management, medication administration, pain management and patient education, does the patient require 24 hr/day rehab nursing? Yes 4. Does the patient require coordinated care of a physician, rehab nurse, therapy disciplines of PT,OT to address physical and functional deficits in the context of the above medical diagnosis(es)? Yes Addressing deficits in the following areas: balance, endurance, locomotion, strength, transferring, bowel/bladder control, bathing, dressing, feeding, grooming and toileting 5. Can the patient actively participate in an intensive therapy program of at least 3 hrs of therapy per day at least 5 days per week? Yes 6. The potential for patient to make measurable gains while on inpatient rehab is good and fair 7. Anticipated functional outcomes upon discharge from inpatient  rehab are modified independent  with PT, modified independent with OT, n/a with SLP. 8. Estimated rehab length of stay to reach the above functional goals is: 2-2.5 weeks 9. Anticipated discharge destination: Home 10. Overall Rehab/Functional Prognosis: good and fair  RECOMMENDATIONS: This patient's condition is appropriate for continued rehabilitative care in the following setting: CIR Patient has agreed to participate in recommended program. Yes Note that insurance prior authorization may be required for reimbursement for recommended care.  Comment:  1. Pt will need an AFO on L when  admitted to CIR.  2. Pt's left leg much weaker than R leg 3. Suggest PVRs to make sure emptying 4. Will submit for admissions coordinators /[id number] 3. Pt is NOT safe to go home currently- 6. Thank you for this consult.    Bary Leriche, PA-C 11/15/2020    I have personally performed a face to face diagnostic evaluation of this patient and formulated the key components of the plan.  Additionally, I have personally reviewed laboratory data, imaging studies, as well as relevant notes and concur with the physician assistant's documentation above.

## 2020-11-16 DIAGNOSIS — E1140 Type 2 diabetes mellitus with diabetic neuropathy, unspecified: Secondary | ICD-10-CM | POA: Diagnosis not present

## 2020-11-16 LAB — COMPREHENSIVE METABOLIC PANEL
ALT: 16 U/L (ref 0–44)
AST: 11 U/L — ABNORMAL LOW (ref 15–41)
Albumin: 3.4 g/dL — ABNORMAL LOW (ref 3.5–5.0)
Alkaline Phosphatase: 42 U/L (ref 38–126)
Anion gap: 6 (ref 5–15)
BUN: 18 mg/dL (ref 6–20)
CO2: 22 mmol/L (ref 22–32)
Calcium: 9 mg/dL (ref 8.9–10.3)
Chloride: 104 mmol/L (ref 98–111)
Creatinine, Ser: 0.84 mg/dL (ref 0.61–1.24)
GFR, Estimated: >60 mL/min (ref >60)
Glucose, Bld: 255 mg/dL — ABNORMAL HIGH (ref 70–99)
Potassium: 4.2 mmol/L (ref 3.5–5.1)
Sodium: 132 mmol/L — ABNORMAL LOW (ref 135–145)
Total Bilirubin: 0.6 mg/dL (ref 0.3–1.2)
Total Protein: 4.9 g/dL — ABNORMAL LOW (ref 6.5–8.1)

## 2020-11-16 LAB — CBC WITH DIFFERENTIAL/PLATELET
Abs Immature Granulocytes: 0.03 10*3/uL (ref 0.00–0.07)
Basophils Absolute: 0 10*3/uL (ref 0.0–0.1)
Basophils Relative: 1 %
Eosinophils Absolute: 0.2 10*3/uL (ref 0.0–0.5)
Eosinophils Relative: 3 %
HCT: 35.4 % — ABNORMAL LOW (ref 39.0–52.0)
Hemoglobin: 12.2 g/dL — ABNORMAL LOW (ref 13.0–17.0)
Immature Granulocytes: 0 %
Lymphocytes Relative: 20 %
Lymphs Abs: 1.4 10*3/uL (ref 0.7–4.0)
MCH: 30.7 pg (ref 26.0–34.0)
MCHC: 34.5 g/dL (ref 30.0–36.0)
MCV: 88.9 fL (ref 80.0–100.0)
Monocytes Absolute: 0.7 10*3/uL (ref 0.1–1.0)
Monocytes Relative: 10 %
Neutro Abs: 4.8 10*3/uL (ref 1.7–7.7)
Neutrophils Relative %: 66 %
Platelets: 213 10*3/uL (ref 150–400)
RBC: 3.98 MIL/uL — ABNORMAL LOW (ref 4.22–5.81)
RDW: 12.6 % (ref 11.5–15.5)
WBC: 7.3 10*3/uL (ref 4.0–10.5)
nRBC: 0 % (ref 0.0–0.2)

## 2020-11-16 LAB — PROCALCITONIN: Procalcitonin: <0.1 ng/mL

## 2020-11-16 LAB — GLUCOSE, CAPILLARY
Glucose-Capillary: 210 mg/dL — ABNORMAL HIGH (ref 70–99)
Glucose-Capillary: 125 mg/dL — ABNORMAL HIGH (ref 70–99)
Glucose-Capillary: 83 mg/dL (ref 70–99)
Glucose-Capillary: 201 mg/dL — ABNORMAL HIGH (ref 70–99)

## 2020-11-16 LAB — MAGNESIUM: Magnesium: 1.8 mg/dL (ref 1.7–2.4)

## 2020-11-16 MED ORDER — ACD FORMULA A 0.73-2.45-2.2 GM/100ML VI SOLN
1000.0000 mL | Status: DC
  Filled 2020-11-16: qty 1000

## 2020-11-16 MED ORDER — HEPARIN SODIUM (PORCINE) 1000 UNIT/ML IJ SOLN
INTRAMUSCULAR | Status: AC
  Administered 2020-11-16: 2800 [IU]
  Filled 2020-11-16: qty 4

## 2020-11-16 MED ORDER — HEPARIN SODIUM (PORCINE) 1000 UNIT/ML IJ SOLN
1000.0000 [IU] | Freq: Once | INTRAMUSCULAR | Status: DC
Start: 2020-11-16 — End: 2020-11-16

## 2020-11-16 MED ORDER — CALCIUM CARBONATE ANTACID 500 MG PO CHEW
CHEWABLE_TABLET | ORAL | Status: AC
  Administered 2020-11-16: 400 mg via ORAL
  Filled 2020-11-16: qty 4

## 2020-11-16 MED ORDER — ACETAMINOPHEN 325 MG PO TABS: 650.0000 mg | ORAL_TABLET | ORAL | Status: DC | PRN

## 2020-11-16 MED ORDER — SODIUM CHLORIDE 0.9 % IV SOLN
INTRAVENOUS | Status: AC
  Filled 2020-11-16 (×3): qty 200

## 2020-11-16 MED ORDER — CALCIUM GLUCONATE-NACL 2-0.675 GM/100ML-% IV SOLN
2.0000 g | Freq: Once | INTRAVENOUS | Status: AC
  Filled 2020-11-16: qty 100

## 2020-11-16 MED ORDER — ACD FORMULA A 0.73-2.45-2.2 GM/100ML VI SOLN
Status: AC
  Filled 2020-11-16: qty 500

## 2020-11-16 MED ORDER — DIPHENHYDRAMINE HCL 25 MG PO CAPS: 25.0000 mg | ORAL_CAPSULE | Freq: Four times a day (QID) | ORAL | Status: DC | PRN

## 2020-11-16 MED ORDER — CALCIUM CARBONATE ANTACID 500 MG PO CHEW: 2.0000 | CHEWABLE_TABLET | ORAL | Status: DC

## 2020-11-16 MED ORDER — CALCIUM GLUCONATE-NACL 2-0.675 GM/100ML-% IV SOLN
INTRAVENOUS | Status: AC
  Administered 2020-11-16: 2000 mg via INTRAVENOUS
  Filled 2020-11-16: qty 100

## 2020-11-16 NOTE — Progress Notes (Signed)
PROGRESS NOTE        PATIENT DETAILS Name: Tyler Brewer Age: 60 y.o. Sex: male Date of Birth: 1960-09-07 Admit Date: 11/04/2020 Admitting Physician Etta Quill, DO GDJ:MEQAS Koren Shiver, DO  Brief Narrative: Patient is a 60 y.o. male with history of DM-2, CAD, OSA-presenting to the hospital with frequent falls-patient has had progressive bilateral lower extremity weakness since August 2021.  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  See below for further details  Significant events: 3/18>> admit for lower extremity weakness 3/25>> PLEX  started  Significant studies: 2/23>> MRI thoracic spine: No acute abnormalities noted. 3/19>> MRI lumbar sacral spine: No acute process identified. 3/19>> MRI C-spine: Small disc herniation at C3-C4, C4-C5-no abnormal cord signal. 3/19>> vitamin B12: 538 3/19>> HIV: Nonreactive 3/19>> TSH: Normal limits 3/19>> vitamin B6: Pending 3/19>> HSV 1/HSV 2 DNA by PCR blood: Negative 3/19>> CMV DNA by PCR blood>> negative 3/20>> MRI T-spine with contrast: Normal-appearing thoracic cord 3/20>> MRI C-spine: Normal-appearing cervical cord-no pathologic enhancement. 3/23>> CSF: WBC 3, protein 124 3/23>> arbovirus panel- CSF: Pending   Antimicrobial therapy: None  Microbiology data: 3/23>> CSF culture: No growth  Procedures : 3/23>> fluoroscopy-guided lumbar puncture  Consults: Neurology  DVT Prophylaxis : Prophylactic Lovenox   Subjective:  Patient in bed, appears comfortable, denies any headache, no fever, no chest pain or pressure, no shortness of breath , no abdominal pain. R. arm pain better, now L eye swelling with some discharge.    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  Continue PT-OT May require placement. DW Neuro 11/15/20.  Right arm with previous IV site redness improved superficial thrombophlebitis.  Soft tissue & Venous ultrasound stable, continue with Warm compress.  Monitor.  Left eye swelling likely chemosis - seen by ophthalmologist with Dr. Ellie Lunch, supportive care and monitor.  noncompliance with medical recommendations.  Patient counseled and warned of consequences, he understands and assumes all responsibility of any adverse outcome.  Peripheral diabetic nephropathy: Continue Neurontin/Mobic and Cymbalta.  HTN: On combination of losartan, Norvasc and have added hydralazine, monitor.  HLD: Continue statin  BPH: Continue Flomax  Hyponatremia: Appears to be mild-and chronic-continue to follow periodically.  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  Obesity: Estimated body mass index is 40.06 kg/m as calculated from the following:   Height as of this encounter: 5\' 11"  (1.803 m).   Weight as of this encounter: 130.3 kg.   DM-2 (A1c 10.6 on 3/19): On Lantus along with premeal NovoLog and sliding scale, Lantus increased for better control on 11/13/2020.  Poor outpatient control due to hyperglycemia.  CBG (last 3)  Recent Labs    11/15/20 1722 11/15/20 2018 11/16/20 0636  GLUCAP 229* 229* 210*     Diet: Diet Order            Diet Carb Modified Fluid consistency: Thin; Room service appropriate? Yes  Diet effective now                  Code Status: Full code   Family Communication:  Mother [name] [phone] on 11/12/20  Disposition Plan: Status is: Inpatient  The patient will require care  spanning > 2 midnights and should be moved to inpatient because: Inpatient level of care appropriate due to severity of illness  Dispo: The patient is from: Home              Anticipated d/c is to: To be determined              Patient currently is not medically stable to  d/c.   Difficult to place patient No    Barriers to Discharge: CIDP-starting PLEX 3/25  Antimicrobial agents: Anti-infectives (From admission, onward)   Start     Dose/Rate Route Frequency Ordered Stop   11/14/20 2100  vancomycin (VANCOREADY) IVPB 1500 mg/300 mL  Status:  Discontinued        1,500 mg 150 mL/hr over 120 Minutes Intravenous Every 12 hours 11/14/20 0757 11/14/20 0927   11/14/20 1000  doxycycline (VIBRA-TABS) tablet 100 mg        100 mg Oral Every 12 hours 11/14/20 0907     11/14/20 0845  vancomycin (VANCOREADY) IVPB 2000 mg/400 mL  Status:  Discontinued        2,000 mg 200 mL/hr over 120 Minutes Intravenous  Once 11/14/20 0757 11/14/20 0927   11/13/20 1030  fluconazole (DIFLUCAN) tablet 100 mg        100 mg Oral Daily 11/13/20 0941 11/20/20 0959       Time spent: 25- minutes-Greater than 50% of this time was spent in counseling, explanation of diagnosis, planning of further management, and coordination of care.  MEDICATIONS: Scheduled Meds: . amLODipine  5 mg Oral Daily  . Chlorhexidine Gluconate Cloth  6 each Topical Daily  . doxycycline  100 mg Oral Q12H  . DULoxetine  30 mg Oral Once per day on Mon Wed Fri  . enoxaparin (LOVENOX) injection  60 mg Subcutaneous Q24H  . fluconazole  100 mg Oral Daily  . gabapentin  300 mg Oral 2 times per day  . gabapentin  600 mg Oral Daily  . heparin sodium (porcine)  1,000 Units Intracatheter Once  . hydrALAZINE  50 mg Oral Q8H  . insulin aspart  0-15 Units Subcutaneous TID WC  . insulin aspart  8 Units Subcutaneous TID WC  . insulin glargine  50 Units Subcutaneous Daily  . losartan  100 mg Oral Daily  . multivitamin with minerals  1 tablet Oral Daily  . nystatin   Topical BID  . pravastatin  40 mg Oral q1800  . Ensure Max Protein  11 oz Oral BID  . vitamin B-6  100 mg Oral Daily  . tamsulosin  0.4 mg Oral Daily  . tobramycin   Both Eyes TID   Continuous Infusions: . citrate dextrose    . citrate dextrose      PRN Meds:.acetaminophen **OR** acetaminophen, acetaminophen, diclofenac Sodium, diphenhydrAMINE, lidocaine (PF), LORazepam, meloxicam, methocarbamol, [DISCONTINUED] ondansetron **OR** ondansetron (ZOFRAN) IV, polyethylene glycol, traMADol   PHYSICAL EXAM: Vital signs: Vitals:   11/15/20 0830 11/15/20 1635 11/15/20 2016 11/16/20 0527  BP: 119/71 128/86 136/74 (!) 143/77  Pulse: 94 84 94 92  Resp: 18 18 16 18   Temp: 97.7 F (36.5 C) 97.8 F (36.6 C) 98.1 F (36.7 C) 98.2 F (36.8 C)  TempSrc:      SpO2: 99% 100% 99% 98%  Weight:   130.3 kg   Height:       Filed Weights   11/04/20 1409 11/12/20 2016 11/15/20 2016  Weight: 124.7 kg 132.5 kg 130.3 kg   Body mass index is  40.06 kg/m.   Gen Exam:  Awake Alert, strength in upper extremities 5/5, right lower extremity 4/5, left lower leg 3/5, R forearm IV site under bandage, left eye massively swollen both upper, mild conjunctival injection with some discharge [place].AT,  Supple Neck,No JVD, No cervical lymphadenopathy appriciated.  Symmetrical Chest wall movement, Good air movement bilaterally, CTAB RRR,No Gallops, Rubs or new Murmurs, No Parasternal Heave +ve B.Sounds, Abd Soft, No tenderness, No organomegaly appriciated, No rebound - guarding or rigidity. No Cyanosis, Clubbing or edema, No new Rash or bruise     I have personally reviewed following labs and imaging studies  LABORATORY DATA: CBC: Recent Labs  Lab 11/12/20 0346 11/13/20 0250 11/14/20 0251 11/15/20 0514 11/16/20 0431  WBC 9.2 8.1 8.2 8.5 7.3  NEUTROABS  --  5.2 4.8 5.8 4.8  HGB 13.7 13.4 13.1 12.6* 12.2*  HCT 39.1 39.2 40.2 37.7* 35.4*  MCV 87.1 89.1 91.6 90.0 88.9  PLT 248 211 204 208 062    Basic Metabolic Panel: Recent Labs  Lab 11/13/20 0250 11/14/20 0251 11/14/20 1424 11/15/20 0514 11/16/20 0431  NA 135 133* 136 135 132*  K 4.3 4.0 3.9 4.2 4.2  CL 105 104 105 106 104  CO2 26 23 24 24 22   GLUCOSE 309* 250* 232* 196* 255*  BUN 16 17  17 18 18   CREATININE 0.88 0.83 0.85 0.87 0.84  CALCIUM 9.1 8.9 8.9 9.0 9.0  MG 1.7 1.8  --  1.7 1.8    GFR: Estimated Creatinine Clearance: 133.5 mL/min (by C-G formula based on SCr of 0.84 mg/dL).  Liver Function Tests: Recent Labs  Lab 11/12/20 0219 11/13/20 0250 11/14/20 0251 11/15/20 0514 11/16/20 0431  AST 16 11* 13* 10* 11*  ALT 19 16 19 13 16   ALKPHOS 55 45 46 32* 42  BILITOT 0.5 0.7 0.2* 0.7 0.6  PROT 5.1* 5.0* 5.2* 4.9* 4.9*  ALBUMIN 3.5 3.7 3.5 3.8 3.4*   No results for input(s): LIPASE, AMYLASE in the last 168 hours. No results for input(s): AMMONIA in the last 168 hours.  Coagulation Profile: No results for input(s): INR, PROTIME in the last 168 hours.  Cardiac Enzymes: No results for input(s): CKTOTAL, CKMB, CKMBINDEX, TROPONINI in the last 168 hours.  BNP (last 3 results) No results for input(s): PROBNP in the last 8760 hours.  Lipid Profile: No results for input(s): CHOL, HDL, LDLCALC, TRIG, CHOLHDL, LDLDIRECT in the last 72 hours.  Thyroid Function Tests: No results for input(s): TSH, T4TOTAL, FREET4, T3FREE, THYROIDAB in the last 72 hours.  Anemia Panel: No results for input(s): VITAMINB12, FOLATE, FERRITIN, TIBC, IRON, RETICCTPCT in the last 72 hours.  Urine analysis:    Component Value Date/Time   COLORURINE YELLOW 11/05/2020 2029   APPEARANCEUR HAZY (A) 11/05/2020 2029   LABSPEC 1.028 11/05/2020 2029   PHURINE 5.0 11/05/2020 2029   GLUCOSEU >=500 (A) 11/05/2020 2029   GLUCOSEU 100 (A) 09/25/2016 1041   HGBUR NEGATIVE 11/05/2020 2029   BILIRUBINUR NEGATIVE 11/05/2020 2029   BILIRUBINUR neg 04/26/2016 0836   KETONESUR NEGATIVE 11/05/2020 2029   PROTEINUR NEGATIVE 11/05/2020 2029   UROBILINOGEN 0.2 09/25/2016 1041   NITRITE NEGATIVE 11/05/2020 2029   LEUKOCYTESUR NEGATIVE 11/05/2020 2029    Sepsis Labs: Lactic Acid, Venous    Component Value Date/Time   LATICACIDVEN 1.3 01/21/2020 0915    MICROBIOLOGY: Recent Results (from the  past 240 hour(s))  CSF culture w Gram Stain     Status: None   Collection Time: 11/09/20  9:38 AM   Specimen: PATH Cytology CSF; Cerebrospinal Fluid  Result Value Ref Range Status   Specimen Description CSF  Final   Special Requests NONE  Final   Gram Stain   Final    WBC PRESENT, PREDOMINANTLY MONONUCLEAR NO ORGANISMS SEEN CYTOSPIN SMEAR    Culture   Final    NO GROWTH Performed at [facility], [place] [address], [place], [place] [address]    Report Status 11/12/2020 FINAL  Final    RADIOLOGY STUDIES/RESULTS: [place] RT UPPER EXTREM LTD SOFT TISSUE NON VASCULAR  Result Date: 11/14/2020 CLINICAL DATA:  Right forearm swelling and redness in the area of recent IV access. EXAM: ULTRASOUND RIGHT UPPER EXTREMITY LIMITED TECHNIQUE: Ultrasound examination of the upper extremity soft tissues was performed in the area of clinical concern. COMPARISON:  None. FINDINGS: Focused ultrasound of the right forearm demonstrates prominent soft tissue swelling with hyperemia. Superficial tubular structure without internal flow deep to the subcutaneous edema concerning for superficial venous thrombosis. IMPRESSION: 1. Findings concerning for superficial venous thrombosis in the right forearm. Recommend dedicated right upper extremity duplex venous ultrasound. Electronically Signed   By: Titus Dubin M.D.   On: 11/14/2020 16:20   VAS [place] UPPER EXTREMITY VENOUS DUPLEX  Result Date: 11/15/2020 UPPER VENOUS STUDY  Indications: Pain, erythema, and swelling at previous IV site. Comparison Study: 11-14-2020 Ultrasound soft tissue of RT upper extremity was                   concerning for SVT of forearm. Performing Technologist: Darlin Coco RDMS,RVT  Examination Guidelines: A complete evaluation includes B-mode imaging, spectral Doppler, color Doppler, and power Doppler as needed of all accessible portions of each vessel. Bilateral testing is considered an integral part of a complete examination. Limited examinations  for reoccurring indications may be performed as noted.  Right Findings: +----------+------------+---------+-----------+----------+---------------------+ RIGHT     CompressiblePhasicitySpontaneousProperties       Summary        +----------+------------+---------+-----------+----------+---------------------+ IJV                                                 Unable to assess due                                                          to bandaging      +----------+------------+---------+-----------+----------+---------------------+ Subclavian    Full       Yes       Yes                                    +----------+------------+---------+-----------+----------+---------------------+ Axillary      Full       Yes       Yes                                    +----------+------------+---------+-----------+----------+---------------------+ Brachial      Full                                                        +----------+------------+---------+-----------+----------+---------------------+  Radial        Full                                                        +----------+------------+---------+-----------+----------+---------------------+ Ulnar         Full                                                        +----------+------------+---------+-----------+----------+---------------------+ Cephalic      None       No        No                       Acute         +----------+------------+---------+-----------+----------+---------------------+ Basilic       Full                                                        +----------+------------+---------+-----------+----------+---------------------+  Left Findings: +----------+------------+---------+-----------+----------+-------+ LEFT      CompressiblePhasicitySpontaneousPropertiesSummary +----------+------------+---------+-----------+----------+-------+ Subclavian    Full       Yes        Yes                      +----------+------------+---------+-----------+----------+-------+  Summary:  Right: No evidence of deep vein thrombosis in the upper extremity. Findings consistent with acute superficial vein thrombosis involving the right cephalic vein.  Left: No evidence of thrombosis in the subclavian.  *See table(s) above for measurements and observations.  Diagnosing physician: Curt Jews MD Electronically signed by Curt Jews MD on 11/15/2020 at 96:44:12 PM.    Final      LOS: 10 days   Lala Lund, MD  [facility]  11/16/2020, 9:57 AM

## 2020-11-16 NOTE — Clinical Social Work Note (Signed)
  [facility]  RE: Tyler Brewer Date of Birth: 27-Aug-1960 Date: 11/16/20  To Whom It May Concern:  Please be advised that the above-named patient will require a short-term nursing home stay - anticipated 30 days or less for rehabilitation and strengthening. The plan is for return home.

## 2020-11-16 NOTE — Progress Notes (Signed)
Inpatient Diabetes Program Recommendations  AACE/ADA: New Consensus Statement on Inpatient Glycemic Control   Target Ranges:  Prepandial:   less than 140 mg/dL      Peak postprandial:   less than 180 mg/dL (1-2 hours)      Critically ill patients:  140 - 180 mg/dL   Results for Tyler Brewer, Tyler Brewer (MRN [id number]) as of 11/16/2020 11:43  Ref. Range 11/15/2020 06:47 11/15/2020 12:12 11/15/2020 17:22 11/15/2020 20:18 11/16/2020 06:36  Glucose-Capillary Latest Ref Range: 70 - 99 mg/dL 177 (H) 209 (H) 229 (H) 229 (H) 210 (H)   Review of Glycemic Control  Diabetes history: DKM2 Outpatient Diabetes medications: Tresiba 40 units daily, Novolog 10 units TID with meals Current orders for Inpatient glycemic control: Lantus 50 units daily, Novolog 8 units TID with meals for meal coverage, Novolog 0-20 units TID with meals  Inpatient Diabetes Program Recommendations:    Insulin: Please consider increasing meal coverage to Novolog 11 units TID with meals and adding Novolog 0-5 units QHS for bedtime correction.  Thanks, Barnie Alderman, RN, MSN, CDE Diabetes Coordinator Inpatient Diabetes Program [phone] (Team Pager from 8am to 5pm)

## 2020-11-16 NOTE — NC FL2 (Signed)
[place] LEVEL OF CARE SCREENING TOOL     IDENTIFICATION  Patient Name: Tyler Brewer Birthdate: Nov 20, 1960 Sex: male Admission Date (Current Location): 11/04/2020  [facility] and [place] Number:  Herbalist and Address:  [facility] [place], [place] [address] [place], [place] [address]      Provider Number: [id number]  Attending Physician Name and Address:  Thurnell Lose, MD  Relative Name and Phone Number:  Lonzy Mato - mother, [phone] or [phone]    Current Level of Care: Hospital Recommended Level of Care: [place] Prior Approval Number:    Date Approved/Denied:   PASRR Number:  (Submitted for [facility] 3/30. [place] MUST #[id number])  Discharge Plan: SNF    Current Diagnoses: Patient Active Problem List   Diagnosis Date Noted  . Bilateral leg weakness 11/05/2020  . Acute left-sided low back pain with right-sided sciatica 07/19/2020  . Hip pain, acute, left 07/05/2020  . Tremor 07/05/2020  . Weakness 07/05/2020  . Balance problem 03/11/2020  . Tinea cruris 03/11/2020  . Cellulitis of left groin 03/11/2020  . Acute pain of right shoulder 03/11/2020  . Interstitial lung disease ([place]) 05/02/2017  . Pansinusitis 09/26/2016  . Diabetic polyneuropathy associated with diabetes mellitus due to underlying condition ([place]) 05/08/2016  . Methamphetamine use disorder, severe, dependence ([place]) 02/11/2016  . Substance induced mood disorder ([place]) 02/11/2016  . Exertional dyspnea 11/30/2015  . ADD (attention deficit disorder) 09/29/2015  . Binge eating 09/29/2015  . Syncope 05/05/2012  . Diarrhea 05/05/2012  . Panic attacks 05/05/2012  . OSA (obstructive sleep apnea) 05/05/2012  . HTN (hypertension) 05/05/2012  . Dyslipidemia 05/05/2012  . CAD (coronary artery disease)  cardiac cath with moderate disease in a septal branch of the ramus intermedius 04/01/2012  . Chest pain 04/01/2012  . Drug abuse and dependence ([place])  04/01/2012  . Family history of coronary artery disease 03/31/2012  . Sleep apnea, on C-pap 03/31/2012  . Hyperlipemia 03/30/2012  . HTN (hypertension), benign 03/30/2012  . Morbid obesity ([place]) 03/30/2012  . DM type 2, uncontrolled, with neuropathy ([place]) 03/30/2012  . Depression with suicidal ideation 03/30/2012    Orientation RESPIRATION BLADDER Height & Weight     Self,Time,Situation,Place  Normal Continent Weight: 287 lb 4.2 oz (130.3 kg) Height:  5\' 11"  (180.3 cm)  BEHAVIORAL SYMPTOMS/MOOD NEUROLOGICAL BOWEL NUTRITION STATUS      Continent Diet (Carb modified)  AMBULATORY STATUS COMMUNICATION OF NEEDS Skin   Limited Assist (Min guard per PT) Verbally Skin abrasions (Rash arm, thigh, groin)                       Personal Care Assistance Level of Assistance  Bathing,Feeding,Dressing Bathing Assistance: Limited assistance (Lowe bidy min guard) Feeding assistance: Independent Dressing Assistance: Limited assistance (Lower body min assist)     Functional Limitations Info  Sight,Hearing,Speech Sight Info: Impaired Hearing Info: Adequate Speech Info: Adequate    SPECIAL CARE FACTORS FREQUENCY  PT (By licensed PT),OT (By licensed OT)     PT Frequency: Evaluated 3/19 - PT at SNF eval and treat, a minimum of 5 days per week OT Frequency: Evaluated 3/20. OT at SNF eval and treat, a minimum of 5 dayps per week            Contractures Contractures Info: Not present    Additional Factors Info  Code Status,Allergies,Insulin Sliding Scale Code Status Info: Full Allergies Info: No known allergies   Insulin Sliding Scale Info:  0-15 Units 3 times per day with measl       Current Medications (11/16/2020):  This is the current hospital active medication list Current Facility-Administered Medications  Medication Dose Route Frequency Provider Last Rate Last Admin  . acetaminophen (TYLENOL) tablet 650 mg  650 mg Oral Q6H PRN Etta Quill, DO   650 mg at 11/15/20 1728    Or  . acetaminophen (TYLENOL) suppository 650 mg  650 mg Rectal Q6H PRN Etta Quill, DO      . acetaminophen (TYLENOL) tablet 650 mg  650 mg Oral Q4H PRN Kerney Elbe, MD   650 mg at 11/13/20 1958  . amLODipine (NORVASC) tablet 5 mg  5 mg Oral Daily Jonetta Osgood, MD   5 mg at 11/15/20 8101  . Chlorhexidine Gluconate Cloth 2 % PADS 6 each  6 each Topical Daily Jonetta Osgood, MD   6 each at 11/13/20 0935  . citrate dextrose (ACD-A anticoagulant) solution 1,000 mL  1,000 mL Other Continuous Kerney Elbe, MD      . citrate dextrose (ACD-A anticoagulant) solution 1,000 mL  1,000 mL Other Continuous Kerney Elbe, MD   1,000 mL at 11/14/20 1512  . diclofenac Sodium (VOLTAREN) 1 % topical gel 2 g  2 g Topical QID PRN Etta Quill, DO   2 g at 11/11/20 2148  . diphenhydrAMINE (BENADRYL) capsule 25 mg  25 mg Oral Q6H PRN Kerney Elbe, MD      . doxycycline (VIBRA-TABS) tablet 100 mg  100 mg Oral Q12H Thurnell Lose, MD   100 mg at 11/16/20 0854  . DULoxetine (CYMBALTA) DR capsule 30 mg  30 mg Oral Once per day on Mon Wed Fri Jennette Kettle M, DO   30 mg at 11/14/20 7510  . enoxaparin (LOVENOX) injection 60 mg  60 mg Subcutaneous Q24H Jonetta Osgood, MD   60 mg at 11/16/20 0854  . fluconazole (DIFLUCAN) tablet 100 mg  100 mg Oral Daily Thurnell Lose, MD   100 mg at 11/15/20 0823  . gabapentin (NEURONTIN) capsule 300 mg  300 mg Oral 2 times per day Etta Quill, DO   300 mg at 11/15/20 2132  . gabapentin (NEURONTIN) capsule 600 mg  600 mg Oral Daily Jennette Kettle M, DO   600 mg at 11/16/20 2585  . heparin sodium (porcine) injection 1,000 Units  1,000 Units Intracatheter Once Kerney Elbe, MD      . hydrALAZINE (APRESOLINE) tablet 50 mg  50 mg Oral Q8H Thurnell Lose, MD   50 mg at 11/15/20 2132  . insulin aspart (novoLOG) injection 0-15 Units  0-15 Units Subcutaneous TID WC Etta Quill, DO   5 Units at 11/16/20 0900  . insulin aspart (novoLOG) injection 8  Units  8 Units Subcutaneous TID WC Jonetta Osgood, MD   8 Units at 11/16/20 0900  . insulin glargine (LANTUS) injection 50 Units  50 Units Subcutaneous Daily Thurnell Lose, MD   50 Units at 11/16/20 0854  . lidocaine (PF) (XYLOCAINE) 1 % injection    PRN Corrie Mckusick, DO   5 mL at 11/10/20 1600  . LORazepam (ATIVAN) tablet 0.5 mg  0.5 mg Oral Q8H PRN Jonetta Osgood, MD   0.5 mg at 11/15/20 2132  . losartan (COZAAR) tablet 100 mg  100 mg Oral Daily Jennette Kettle M, DO   100 mg at 11/15/20 2778  . meloxicam (MOBIC) tablet 7.5 mg  7.5 mg Oral BID  PRN Etta Quill, DO   7.5 mg at 11/06/20 2119  . methocarbamol (ROBAXIN) tablet 500 mg  500 mg Oral Q6H PRN Etta Quill, DO   500 mg at 11/15/20 1734  . multivitamin with minerals tablet 1 tablet  1 tablet Oral Daily Jonetta Osgood, MD   1 tablet at 11/15/20 [phone]  . nystatin (MYCOSTATIN/NYSTOP) topical powder   Topical BID Jonetta Osgood, MD   Given at 11/14/20 2128  . ondansetron (ZOFRAN) injection 4 mg  4 mg Intravenous Q6H PRN Etta Quill, DO      . polyethylene glycol (MIRALAX / GLYCOLAX) packet 17 g  17 g Oral Daily PRN Jonetta Osgood, MD   17 g at 11/06/20 2246  . pravastatin (PRAVACHOL) tablet 40 mg  40 mg Oral q1800 Etta Quill, DO   40 mg at 11/15/20 1728  . protein supplement (ENSURE MAX) liquid  11 oz Oral BID Jonetta Osgood, MD   11 oz at 11/15/20 2133  . pyridOXINE (VITAMIN B-6) tablet 100 mg  100 mg Oral Daily Thurnell Lose, MD   100 mg at 11/15/20 0823  . tamsulosin (FLOMAX) capsule 0.4 mg  0.4 mg Oral Daily Jennette Kettle M, DO   0.4 mg at 11/15/20 2536  . tobramycin (TOBREX) 0.3 % ophthalmic ointment   Both Eyes TID Thurnell Lose, MD   Given at 11/16/20 [phone]  . traMADol (ULTRAM) tablet 50-100 mg  50-100 mg Oral Q8H PRN Etta Quill, DO   100 mg at 11/16/20 3474     Discharge Medications: Please see discharge summary for a list of discharge medications.  Relevant Imaging  Results:  Relevant Lab Results:   Additional Information [id number]  Sable Feil, LCSW

## 2020-11-16 NOTE — Progress Notes (Signed)
Inpatient Rehab Admissions Coordinator:   Note PT/OT still recommending SNF.  Pt appears agreeable.  Will sign off at this time.   Shann Medal, PT, DPT Admissions Coordinator [phone] 11/16/20  11:46 AM

## 2020-11-16 NOTE — TOC Progression Note (Addendum)
Transition of Care [facility]) - Progression Note    Patient Details  Name: Tyler Brewer MRN: [id number] Date of Birth: 09/05/1960  Transition of Care [facility]) CM/SW Contact  Sharlet Salina Mila [place], LCSW Phone Number: 11/16/2020, 4:56 PM  Clinical Narrative:  Met with patient briefly at the bedside as he was preparing to go to have a plasma pheresis treatment. He was agreeable to talking with CSW until he was wheeled out of the room. CSW explained recommendation for ST rehab and patient agreeable and reported that he does not have a good support network at home. Patient given overview of facility search process and provided with SNF list.  Patient reported when asked that he is divorced and has not been vaccinated for [place]. He added that he would like to get the COVID vaccination. This information will be shared with patient's nurse.  **Received PASRR number - [id number] E; effective 3/30 - 12/16/20   Expected Discharge Plan: [place] Barriers to Discharge: Continued Medical Work up  Expected Discharge Plan and Services Expected Discharge Plan: [place] In-house Referral: Clinical Social Work     Living arrangements for the past 2 months: Mobile Home                                     Social Determinants of Health (SDOH) Interventions  No SDOH interventions requested or needed at this time  Readmission Risk Interventions No flowsheet data found.

## 2020-11-17 ENCOUNTER — Encounter: Payer: Medicare HMO | Admitting: Physical Medicine & Rehabilitation

## 2020-11-17 DIAGNOSIS — E1140 Type 2 diabetes mellitus with diabetic neuropathy, unspecified: Secondary | ICD-10-CM | POA: Diagnosis not present

## 2020-11-17 LAB — CBC WITH DIFFERENTIAL/PLATELET
Abs Immature Granulocytes: 0.03 10*3/uL (ref 0.00–0.07)
Basophils Absolute: 0 10*3/uL (ref 0.0–0.1)
Basophils Relative: 1 %
Eosinophils Absolute: 0.3 10*3/uL (ref 0.0–0.5)
Eosinophils Relative: 3 %
HCT: 38.2 % — ABNORMAL LOW (ref 39.0–52.0)
Hemoglobin: 12.6 g/dL — ABNORMAL LOW (ref 13.0–17.0)
Immature Granulocytes: 0 %
Lymphocytes Relative: 27 %
Lymphs Abs: 2 10*3/uL (ref 0.7–4.0)
MCH: 30.2 pg (ref 26.0–34.0)
MCHC: 33 g/dL (ref 30.0–36.0)
MCV: 91.6 fL (ref 80.0–100.0)
Monocytes Absolute: 0.8 10*3/uL (ref 0.1–1.0)
Monocytes Relative: 11 %
Neutro Abs: 4.2 10*3/uL (ref 1.7–7.7)
Neutrophils Relative %: 58 %
Platelets: 247 10*3/uL (ref 150–400)
RBC: 4.17 MIL/uL — ABNORMAL LOW (ref 4.22–5.81)
RDW: 12.5 % (ref 11.5–15.5)
WBC: 7.4 10*3/uL (ref 4.0–10.5)
nRBC: 0 % (ref 0.0–0.2)

## 2020-11-17 LAB — MAGNESIUM: Magnesium: 1.8 mg/dL (ref 1.7–2.4)

## 2020-11-17 LAB — GLUCOSE, CAPILLARY
Glucose-Capillary: 129 mg/dL — ABNORMAL HIGH (ref 70–99)
Glucose-Capillary: 194 mg/dL — ABNORMAL HIGH (ref 70–99)
Glucose-Capillary: 184 mg/dL — ABNORMAL HIGH (ref 70–99)
Glucose-Capillary: 74 mg/dL (ref 70–99)

## 2020-11-17 LAB — COMPREHENSIVE METABOLIC PANEL
ALT: 20 U/L (ref 0–44)
AST: 18 U/L (ref 15–41)
Albumin: 4 g/dL (ref 3.5–5.0)
Alkaline Phosphatase: 30 U/L — ABNORMAL LOW (ref 38–126)
Anion gap: 5 (ref 5–15)
BUN: 18 mg/dL (ref 6–20)
CO2: 25 mmol/L (ref 22–32)
Calcium: 8.8 mg/dL — ABNORMAL LOW (ref 8.9–10.3)
Chloride: 107 mmol/L (ref 98–111)
Creatinine, Ser: 0.86 mg/dL (ref 0.61–1.24)
GFR, Estimated: >60 mL/min (ref >60)
Glucose, Bld: 138 mg/dL — ABNORMAL HIGH (ref 70–99)
Potassium: 4 mmol/L (ref 3.5–5.1)
Sodium: 137 mmol/L (ref 135–145)
Total Bilirubin: 0.8 mg/dL (ref 0.3–1.2)
Total Protein: 5.1 g/dL — ABNORMAL LOW (ref 6.5–8.1)

## 2020-11-17 LAB — PROCALCITONIN: Procalcitonin: 0.12 ng/mL

## 2020-11-17 NOTE — Progress Notes (Signed)
PT Cancellation Note  Patient Details Name: Tyler Brewer MRN: [id number] DOB: [birth year]   Cancelled Treatment:    Reason Eval/Treat Not Completed: Medical issues which prohibited therapy. Pt nauseated and unable to participate in session  Lyanne Co, DPT Acute Rehabilitation Services [phone]   [place] 11/17/2020, 12:12 PM

## 2020-11-17 NOTE — Progress Notes (Signed)
PROGRESS NOTE        PATIENT DETAILS Name: Tyler Brewer Age: 60 y.o. Sex: male Date of Birth: [birth year] Admit Date: 11/04/2020 Admitting Physician Etta Quill, DO FXT:[id number] Koren Shiver, DO  Brief Narrative: Patient is a 60 y.o. male with history of DM-2, CAD, OSA-presenting to the hospital with frequent falls-patient has had progressive bilateral lower extremity weakness since August 2021.  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  See below for further details  Significant events: 3/18>> admit for lower extremity weakness 3/25>> PLEX  started  Significant studies: 2/23>> MRI thoracic spine: No acute abnormalities noted. 3/19>> MRI lumbar sacral spine: No acute process identified. 3/19>> MRI C-spine: Small disc herniation at C3-C4, C4-C5-no abnormal cord signal. 3/19>> vitamin B12: 538 3/19>> HIV: Nonreactive 3/19>> TSH: Normal limits 3/19>> vitamin B6: Pending 3/19>> HSV 1/HSV 2 DNA by PCR blood: Negative 3/19>> CMV DNA by PCR blood>> negative 3/20>> MRI T-spine with contrast: Normal-appearing thoracic cord 3/20>> MRI C-spine: Normal-appearing cervical cord-no pathologic enhancement. 3/23>> CSF: WBC 3, protein 124 3/23>> arbovirus panel- CSF: Pending   Antimicrobial therapy: None  Microbiology data: 3/23>> CSF culture: No growth  Procedures : 3/23>> fluoroscopy-guided lumbar puncture  Consults: Neurology  DVT Prophylaxis : Prophylactic Lovenox   Subjective:  Patient in bed, appears comfortable, denies any headache, no fever, no chest pain or pressure, no shortness of breath , no abdominal pain. No focal weakness.  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   Continue PT-OT May require placement. DW Neuro 11/15/20.  Right arm with previous IV site redness improved superficial thrombophlebitis.  Soft tissue & Venous ultrasound stable, continue with Warm compress.  Monitor.  Left eye swelling likely chemosis - seen by ophthalmologist with Dr. Ellie Lunch, supportive care and monitor.  Noncompliance with medical recommendations.  Patient counseled and warned of consequences, he understands and assumes all responsibility of any adverse outcome.  Peripheral diabetic nephropathy: Continue Neurontin/Mobic and Cymbalta.  HTN: On combination of losartan, Norvasc and have added hydralazine, monitor.  HLD: Continue statin  BPH: Continue Flomax  Hyponatremia: Appears to be mild-and chronic-continue to follow periodically.  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  Obesity: Estimated body mass index is 40.1 kg/m as calculated from the following:   Height as of this encounter: 5\' 11"  (1.803 m).   Weight as of this encounter: 130.4 kg.   DM-2 (A1c 10.6 on 3/19): On Lantus along with premeal NovoLog and sliding scale, Lantus increased for better control on 11/13/2020.  Poor outpatient control due to hyperglycemia.  CBG (last 3)  Recent Labs    11/16/20 1843 11/16/20 2118 11/17/20 0646  GLUCAP 83 201* 129*     Diet: Diet Order            Diet Carb Modified Fluid consistency: Thin; Room service appropriate? Yes  Diet effective now                  Code Status: Full code   Family Communication:  Mother [name] [phone] on 11/12/20  Disposition Plan: Status is: Inpatient  The patient will require care spanning > 2 midnights and should be moved to inpatient  because: Inpatient level of care appropriate due to severity of illness  Dispo: The patient is from: Home              Anticipated d/c is to: To be determined              Patient currently is not medically stable to d/c.   Difficult to place patient  No    Barriers to Discharge: CIDP-starting PLEX 3/25  Antimicrobial agents: Anti-infectives (From admission, onward)   Start     Dose/Rate Route Frequency Ordered Stop   11/14/20 2100  vancomycin (VANCOREADY) IVPB 1500 mg/300 mL  Status:  Discontinued        1,500 mg 150 mL/hr over 120 Minutes Intravenous Every 12 hours 11/14/20 0757 11/14/20 0927   11/14/20 1000  doxycycline (VIBRA-TABS) tablet 100 mg        100 mg Oral Every 12 hours 11/14/20 0907     11/14/20 0845  vancomycin (VANCOREADY) IVPB 2000 mg/400 mL  Status:  Discontinued        2,000 mg 200 mL/hr over 120 Minutes Intravenous  Once 11/14/20 0757 11/14/20 0927   11/13/20 1030  fluconazole (DIFLUCAN) tablet 100 mg        100 mg Oral Daily 11/13/20 0941 11/20/20 0959       Time spent: 25- minutes-Greater than 50% of this time was spent in counseling, explanation of diagnosis, planning of further management, and coordination of care.  MEDICATIONS: Scheduled Meds: . amLODipine  5 mg Oral Daily  . Chlorhexidine Gluconate Cloth  6 each Topical Daily  . doxycycline  100 mg Oral Q12H  . DULoxetine  30 mg Oral Once per day on Mon Wed Fri  . enoxaparin (LOVENOX) injection  60 mg Subcutaneous Q24H  . fluconazole  100 mg Oral Daily  . gabapentin  300 mg Oral 2 times per day  . gabapentin  600 mg Oral Daily  . hydrALAZINE  50 mg Oral Q8H  . insulin aspart  0-15 Units Subcutaneous TID WC  . insulin aspart  8 Units Subcutaneous TID WC  . insulin glargine  50 Units Subcutaneous Daily  . losartan  100 mg Oral Daily  . multivitamin with minerals  1 tablet Oral Daily  . nystatin   Topical BID  . pravastatin  40 mg Oral q1800  . Ensure Max Protein  11 oz Oral BID  . vitamin B-6  100 mg Oral Daily  . tamsulosin  0.4 mg Oral Daily  . tobramycin   Both Eyes TID   Continuous Infusions:  PRN Meds:.acetaminophen **OR** acetaminophen, diclofenac Sodium, lidocaine (PF), LORazepam, meloxicam, methocarbamol, [DISCONTINUED] ondansetron  **OR** ondansetron (ZOFRAN) IV, polyethylene glycol, traMADol   PHYSICAL EXAM: Vital signs: Vitals:   11/16/20 1734 11/16/20 1747 11/16/20 2122 11/17/20 0507  BP: (!) 146/90 (!) 143/83 129/70 121/62  Pulse: 76 77 87 84  Resp: 13 11 16 18   Temp: 97.9 F (36.6 C) 98 F (36.7 C) 98.5 F (36.9 C) (!) 97.3 F (36.3 C)  TempSrc:  Oral  Oral  SpO2:  98% 98% 98%  Weight:   130.4 kg   Height:       Filed Weights   11/12/20 2016 11/15/20 2016 11/16/20 2122  Weight: 132.5 kg 130.3 kg 130.4 kg   Body mass index is 40.1 kg/m.   Gen Exam:  Awake Alert, strength in upper extremities 5/5, right lower extremity 4/5, left lower leg 3/5, R forearm IV site under bandage, left eye lids are  swollen but improving,   [place].AT,  Supple Neck,No JVD, No cervical lymphadenopathy appriciated.  Symmetrical Chest wall movement, Good air movement bilaterally, CTAB RRR,No Gallops, Rubs or new Murmurs, No Parasternal Heave +ve B.Sounds, Abd Soft, No tenderness, No organomegaly appriciated, No rebound - guarding or rigidity. No Cyanosis, Clubbing or edema, No new Rash or bruise      I have personally reviewed following labs and imaging studies  LABORATORY DATA: CBC: Recent Labs  Lab 11/13/20 0250 11/14/20 0251 11/15/20 0514 11/16/20 0431 11/17/20 0427  WBC 8.1 8.2 8.5 7.3 7.4  NEUTROABS 5.2 4.8 5.8 4.8 4.2  HGB 13.4 13.1 12.6* 12.2* 12.6*  HCT 39.2 40.2 37.7* 35.4* 38.2*  MCV 89.1 91.6 90.0 88.9 91.6  PLT 211 204 208 213 400    Basic Metabolic Panel: Recent Labs  Lab 11/13/20 0250 11/14/20 0251 11/14/20 1424 11/15/20 0514 11/16/20 0431 11/17/20 0427  NA 135 133* 136 135 132* 137  K 4.3 4.0 3.9 4.2 4.2 4.0  CL 105 104 105 106 104 107  CO2 26 23 24 24 22 25   GLUCOSE 309* 250* 232* 196* 255* 138*  BUN 16 17 17 18 18 18   CREATININE 0.88 0.83 0.85 0.87 0.84 0.86  CALCIUM 9.1 8.9 8.9 9.0 9.0 8.8*  MG 1.7 1.8  --  1.7 1.8 1.8    GFR: Estimated Creatinine Clearance: 130.4 mL/min  (by C-G formula based on SCr of 0.86 mg/dL).  Liver Function Tests: Recent Labs  Lab 11/13/20 0250 11/14/20 0251 11/15/20 0514 11/16/20 0431 11/17/20 0427  AST 11* 13* 10* 11* 18  ALT 16 19 13 16 20   ALKPHOS 45 46 32* 42 30*  BILITOT 0.7 0.2* 0.7 0.6 0.8  PROT 5.0* 5.2* 4.9* 4.9* 5.1*  ALBUMIN 3.7 3.5 3.8 3.4* 4.0   No results for input(s): LIPASE, AMYLASE in the last 168 hours. No results for input(s): AMMONIA in the last 168 hours.  Coagulation Profile: No results for input(s): INR, PROTIME in the last 168 hours.  Cardiac Enzymes: No results for input(s): CKTOTAL, CKMB, CKMBINDEX, TROPONINI in the last 168 hours.  BNP (last 3 results) No results for input(s): PROBNP in the last 8760 hours.  Lipid Profile: No results for input(s): CHOL, HDL, LDLCALC, TRIG, CHOLHDL, LDLDIRECT in the last 72 hours.  Thyroid Function Tests: No results for input(s): TSH, T4TOTAL, FREET4, T3FREE, THYROIDAB in the last 72 hours.  Anemia Panel: No results for input(s): VITAMINB12, FOLATE, FERRITIN, TIBC, IRON, RETICCTPCT in the last 72 hours.  Urine analysis:    Component Value Date/Time   COLORURINE YELLOW 11/05/2020 2029   APPEARANCEUR HAZY (A) 11/05/2020 2029   LABSPEC 1.028 11/05/2020 2029   PHURINE 5.0 11/05/2020 2029   GLUCOSEU >=500 (A) 11/05/2020 2029   GLUCOSEU 100 (A) 09/25/2016 1041   HGBUR NEGATIVE 11/05/2020 2029   BILIRUBINUR NEGATIVE 11/05/2020 2029   BILIRUBINUR neg 04/26/2016 0836   KETONESUR NEGATIVE 11/05/2020 2029   PROTEINUR NEGATIVE 11/05/2020 2029   UROBILINOGEN 0.2 09/25/2016 1041   NITRITE NEGATIVE 11/05/2020 2029   LEUKOCYTESUR NEGATIVE 11/05/2020 2029    Sepsis Labs: Lactic Acid, Venous    Component Value Date/Time   LATICACIDVEN 1.3 01/21/2020 0915    MICROBIOLOGY: Recent Results (from the past 240 hour(s))  CSF culture w Gram Stain     Status: None   Collection Time: 11/09/20  9:38 AM   Specimen: PATH Cytology CSF; Cerebrospinal Fluid  Result  Value Ref Range Status   Specimen Description CSF  Final  Special Requests NONE  Final   Gram Stain   Final    WBC PRESENT, PREDOMINANTLY MONONUCLEAR NO ORGANISMS SEEN CYTOSPIN SMEAR    Culture   Final    NO GROWTH Performed at [facility], [place] [address], [place], [place] [address]    Report Status 11/12/2020 FINAL  Final    RADIOLOGY STUDIES/RESULTS: VAS [place] UPPER EXTREMITY VENOUS DUPLEX  Result Date: 11/15/2020 UPPER VENOUS STUDY  Indications: Pain, erythema, and swelling at previous IV site. Comparison Study: 11-14-2020 Ultrasound soft tissue of RT upper extremity was                   concerning for SVT of forearm. Performing Technologist: Darlin Coco RDMS,RVT  Examination Guidelines: A complete evaluation includes B-mode imaging, spectral Doppler, color Doppler, and power Doppler as needed of all accessible portions of each vessel. Bilateral testing is considered an integral part of a complete examination. Limited examinations for reoccurring indications may be performed as noted.  Right Findings: +----------+------------+---------+-----------+----------+---------------------+ RIGHT     CompressiblePhasicitySpontaneousProperties       Summary        +----------+------------+---------+-----------+----------+---------------------+ IJV                                                 Unable to assess due                                                          to bandaging      +----------+------------+---------+-----------+----------+---------------------+ Subclavian    Full       Yes       Yes                                    +----------+------------+---------+-----------+----------+---------------------+ Axillary      Full       Yes       Yes                                    +----------+------------+---------+-----------+----------+---------------------+ Brachial      Full                                                         +----------+------------+---------+-----------+----------+---------------------+ Radial        Full                                                        +----------+------------+---------+-----------+----------+---------------------+ Ulnar         Full                                                        +----------+------------+---------+-----------+----------+---------------------+  Cephalic      None       No        No                       Acute         +----------+------------+---------+-----------+----------+---------------------+ Basilic       Full                                                        +----------+------------+---------+-----------+----------+---------------------+  Left Findings: +----------+------------+---------+-----------+----------+-------+ LEFT      CompressiblePhasicitySpontaneousPropertiesSummary +----------+------------+---------+-----------+----------+-------+ Subclavian    Full       Yes       Yes                      +----------+------------+---------+-----------+----------+-------+  Summary:  Right: No evidence of deep vein thrombosis in the upper extremity. Findings consistent with acute superficial vein thrombosis involving the right cephalic vein.  Left: No evidence of thrombosis in the subclavian.  *See table(s) above for measurements and observations.  Diagnosing physician: Curt Jews MD Electronically signed by Curt Jews MD on 11/15/2020 at 25:44:12 PM.    Final      LOS: 11 days   Lala Lund, MD  [facility]  11/17/2020, 9:03 AM

## 2020-11-17 NOTE — TOC Progression Note (Signed)
Transition of Care [facility]) - Progression Note    Patient Details  Name: Tyler Brewer MRN: [id number] Date of Birth: 17-Sep-1960  [facility]) CM/SW Contact  Sharlet Salina Mila Homer, LCSW Phone Number: 11/17/2020, 3:58 PM  Clinical Narrative:  Met with patient (twice) and bed offers provided: [place] and [facility]. Patient chose [facility]. Call made to [facility], admissions director [phone]) regarding patient and provided her with requested information regarding patient to initiate authorization.   CSW will continue to follow and provide SW intervention services as needed through discharge.     Expected Discharge Plan: [place] Barriers to Discharge: Continued Medical Work up  Expected Discharge Plan and Services Expected Discharge Plan: [place] In-house Referral: Clinical Social Work     Living arrangements for the past 2 months: Mobile Home                                     Social Determinants of Health (SDOH) Interventions  No SDOH interventions requested or needed at this time  Readmission Risk Interventions No flowsheet data found.

## 2020-11-18 DIAGNOSIS — E1140 Type 2 diabetes mellitus with diabetic neuropathy, unspecified: Secondary | ICD-10-CM | POA: Diagnosis not present

## 2020-11-18 DIAGNOSIS — R29898 Other symptoms and signs involving the musculoskeletal system: Secondary | ICD-10-CM | POA: Diagnosis not present

## 2020-11-18 LAB — COMPREHENSIVE METABOLIC PANEL
ALT: 19 U/L (ref 0–44)
AST: 15 U/L (ref 15–41)
Albumin: 3.8 g/dL (ref 3.5–5.0)
Alkaline Phosphatase: 40 U/L (ref 38–126)
Anion gap: 5 (ref 5–15)
BUN: 18 mg/dL (ref 6–20)
CO2: 24 mmol/L (ref 22–32)
Calcium: 9.1 mg/dL (ref 8.9–10.3)
Chloride: 107 mmol/L (ref 98–111)
Creatinine, Ser: 0.82 mg/dL (ref 0.61–1.24)
GFR, Estimated: >60 mL/min (ref >60)
Glucose, Bld: 171 mg/dL — ABNORMAL HIGH (ref 70–99)
Potassium: 4.1 mmol/L (ref 3.5–5.1)
Sodium: 136 mmol/L (ref 135–145)
Total Bilirubin: 0.7 mg/dL (ref 0.3–1.2)
Total Protein: 5.2 g/dL — ABNORMAL LOW (ref 6.5–8.1)

## 2020-11-18 LAB — CBC WITH DIFFERENTIAL/PLATELET
Abs Immature Granulocytes: 0.03 10*3/uL (ref 0.00–0.07)
Basophils Absolute: 0.1 10*3/uL (ref 0.0–0.1)
Basophils Relative: 1 %
Eosinophils Absolute: 0.2 10*3/uL (ref 0.0–0.5)
Eosinophils Relative: 3 %
HCT: 36.5 % — ABNORMAL LOW (ref 39.0–52.0)
Hemoglobin: 12.2 g/dL — ABNORMAL LOW (ref 13.0–17.0)
Immature Granulocytes: 0 %
Lymphocytes Relative: 22 %
Lymphs Abs: 1.5 10*3/uL (ref 0.7–4.0)
MCH: 30.3 pg (ref 26.0–34.0)
MCHC: 33.4 g/dL (ref 30.0–36.0)
MCV: 90.6 fL (ref 80.0–100.0)
Monocytes Absolute: 0.6 10*3/uL (ref 0.1–1.0)
Monocytes Relative: 10 %
Neutro Abs: 4.3 10*3/uL (ref 1.7–7.7)
Neutrophils Relative %: 64 %
Platelets: 249 10*3/uL (ref 150–400)
RBC: 4.03 MIL/uL — ABNORMAL LOW (ref 4.22–5.81)
RDW: 12.6 % (ref 11.5–15.5)
WBC: 6.7 10*3/uL (ref 4.0–10.5)
nRBC: 0 % (ref 0.0–0.2)

## 2020-11-18 LAB — GLUCOSE, CAPILLARY
Glucose-Capillary: 162 mg/dL — ABNORMAL HIGH (ref 70–99)
Glucose-Capillary: 202 mg/dL — ABNORMAL HIGH (ref 70–99)
Glucose-Capillary: 97 mg/dL (ref 70–99)
Glucose-Capillary: 173 mg/dL — ABNORMAL HIGH (ref 70–99)

## 2020-11-18 LAB — MAGNESIUM: Magnesium: 1.8 mg/dL (ref 1.7–2.4)

## 2020-11-18 LAB — PROCALCITONIN: Procalcitonin: 0.1 ng/mL

## 2020-11-18 MED ORDER — HEPARIN SODIUM (PORCINE) 1000 UNIT/ML IJ SOLN: 1000.0000 [IU] | Freq: Once | INTRAMUSCULAR | Status: DC

## 2020-11-18 MED ORDER — ACD FORMULA A 0.73-2.45-2.2 GM/100ML VI SOLN
Status: AC
  Administered 2020-11-18: 1000 mL
  Filled 2020-11-18: qty 500

## 2020-11-18 MED ORDER — ACETAMINOPHEN 325 MG PO TABS: 650.0000 mg | ORAL_TABLET | ORAL | Status: DC | PRN

## 2020-11-18 MED ORDER — CALCIUM GLUCONATE-NACL 2-0.675 GM/100ML-% IV SOLN
2.0000 g | Freq: Once | INTRAVENOUS | Status: AC
  Filled 2020-11-18: qty 100

## 2020-11-18 MED ORDER — CALCIUM CARBONATE ANTACID 500 MG PO CHEW
CHEWABLE_TABLET | ORAL | Status: AC
  Administered 2020-11-18: 400 mg via ORAL
  Filled 2020-11-18: qty 2

## 2020-11-18 MED ORDER — DIPHENHYDRAMINE HCL 25 MG PO CAPS
25.0000 mg | ORAL_CAPSULE | Freq: Four times a day (QID) | ORAL | Status: DC | PRN
Start: 2020-11-18 — End: 2020-11-18

## 2020-11-18 MED ORDER — CALCIUM GLUCONATE-NACL 2-0.675 GM/100ML-% IV SOLN
INTRAVENOUS | Status: AC
  Administered 2020-11-18: 2000 mg via INTRAVENOUS
  Filled 2020-11-18: qty 100

## 2020-11-18 MED ORDER — CALCIUM CARBONATE ANTACID 500 MG PO CHEW: 2.0000 | CHEWABLE_TABLET | ORAL | Status: DC

## 2020-11-18 MED ORDER — ACETAMINOPHEN 325 MG PO TABS
ORAL_TABLET | ORAL | Status: AC
  Administered 2020-11-18: 650 mg via ORAL
  Filled 2020-11-18: qty 2

## 2020-11-18 MED ORDER — ACD FORMULA A 0.73-2.45-2.2 GM/100ML VI SOLN
1000.0000 mL | Status: DC
  Filled 2020-11-18: qty 1000

## 2020-11-18 MED ORDER — SODIUM CHLORIDE 0.9 % IV SOLN
INTRAVENOUS | Status: AC
  Filled 2020-11-18 (×3): qty 200
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
## 2020-11-18 NOTE — Progress Notes (Signed)
PROGRESS NOTE        PATIENT DETAILS Name: Tyler Brewer Age: 60 y.o. Sex: male Date of Birth: [birth year] Admit Date: 11/04/2020 Admitting Physician Etta Quill, DO [facility]:WEX[facility] Koren Shiver, DO  Brief Narrative: Patient is a 60 y.o. male with history of DM-2, CAD, OSA-presenting to the hospital with frequent falls-patient has had progressive bilateral lower extremity weakness since August 2021.  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  See below for further details  Significant events: 3/18>> admit for lower extremity weakness 3/25>> PLEX  started  Significant studies: 2/23>> MRI thoracic spine: No acute abnormalities noted. 3/19>> MRI lumbar sacral spine: No acute process identified. 3/19>> MRI C-spine: Small disc herniation at C3-C4, C4-C5-no abnormal cord signal. 3/19>> vitamin B12: 538 3/19>> HIV: Nonreactive 3/19>> TSH: Normal limits 3/19>> vitamin B6: Pending 3/19>> HSV 1/HSV 2 DNA by PCR blood: Negative 3/19>> CMV DNA by PCR blood>> negative 3/20>> MRI T-spine with contrast: Normal-appearing thoracic cord 3/20>> MRI C-spine: Normal-appearing cervical cord-no pathologic enhancement. 3/23>> CSF: WBC 3, protein 124 3/23>> arbovirus panel- CSF: Pending   Antimicrobial therapy: None  Microbiology data: 3/23>> CSF culture: No growth  Procedures : 3/23>> fluoroscopy-guided lumbar puncture  Consults: Neurology  DVT Prophylaxis : Prophylactic Lovenox   Subjective:  Patient in bed, appears comfortable, denies any headache, no fever, no chest pain or pressure, no shortness of breath , no abdominal pain. No new focal weakness.   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  Continue PT-OT May require placement. DW Neuro 11/15/20.  He remains noncompliant with PT, has been strictly counseled.  Right arm with previous IV site redness improved superficial thrombophlebitis.  Soft tissue & Venous ultrasound stable, continue with Warm compress.  Monitor.  Left eye swelling likely chemosis - seen by ophthalmologist with Dr. Ellie Lunch, supportive care and monitor.  Noncompliance with medical recommendations.  Patient counseled and warned of consequences, he understands and assumes all responsibility of any adverse outcome.  Peripheral diabetic nephropathy: Continue Neurontin/Mobic and Cymbalta.  HTN: On combination of losartan, Norvasc and have added hydralazine, monitor.  HLD: Continue statin  BPH: Continue Flomax  Hyponatremia: Appears to be mild-and chronic-continue to follow periodically.  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  Obesity: Estimated body mass index is 40.1 kg/m as calculated from the following:   Height as of this encounter: 5\' 11"  (1.803 m).   Weight as of this encounter: 130.4 kg.   DM-2 (A1c 10.6 on 3/19): On Lantus along with premeal NovoLog and sliding scale, Lantus increased for better control on 11/13/2020.  Poor outpatient control due to hyperglycemia.  CBG (last 3)  Recent Labs    11/17/20 2145 11/18/20 0648 11/18/20 1116  GLUCAP 74 162* 202*     Diet: Diet Order            Diet Carb Modified Fluid consistency: Thin; Room service appropriate? Yes  Diet effective now                  Code Status: Full code   Family Communication:  Mother [name] [phone] on 11/12/20  Disposition Plan: Status is: Inpatient  The patient will require care spanning > 2 midnights and should be moved to inpatient because: Inpatient level of care appropriate due to severity of illness  Dispo: The patient is from: Home              Anticipated d/c is to: To be determined              Patient  currently is not medically stable to d/c.   Difficult to place patient No    Barriers to Discharge: CIDP-starting PLEX 3/25  Antimicrobial agents: Anti-infectives (From admission, onward)   Start     Dose/Rate Route Frequency Ordered Stop   11/14/20 2100  vancomycin (VANCOREADY) IVPB 1500 mg/300 mL  Status:  Discontinued        1,500 mg 150 mL/hr over 120 Minutes Intravenous Every 12 hours 11/14/20 0757 11/14/20 0927   11/14/20 1000  doxycycline (VIBRA-TABS) tablet 100 mg        100 mg Oral Every 12 hours 11/14/20 0907     11/14/20 0845  vancomycin (VANCOREADY) IVPB 2000 mg/400 mL  Status:  Discontinued        2,000 mg 200 mL/hr over 120 Minutes Intravenous  Once 11/14/20 0757 11/14/20 0927   11/13/20 1030  fluconazole (DIFLUCAN) tablet 100 mg        100 mg Oral Daily 11/13/20 0941 11/20/20 0959       Time spent: 25- minutes-Greater than 50% of this time was spent in counseling, explanation of diagnosis, planning of further management, and coordination of care.  MEDICATIONS: Scheduled Meds: . amLODipine  5 mg Oral Daily  . Chlorhexidine Gluconate Cloth  6 each Topical Daily  . doxycycline  100 mg Oral Q12H  . DULoxetine  30 mg Oral Once per day on Mon Wed Fri  . enoxaparin (LOVENOX) injection  60 mg Subcutaneous Q24H  . fluconazole  100 mg Oral Daily  . gabapentin  300 mg Oral 2 times per day  . gabapentin  600 mg Oral Daily  . hydrALAZINE  50 mg Oral Q8H  . insulin aspart  0-15 Units Subcutaneous TID WC  . insulin aspart  8 Units Subcutaneous TID WC  . insulin glargine  50 Units Subcutaneous Daily  . losartan  100 mg Oral Daily  . multivitamin with minerals  1 tablet Oral Daily  . nystatin   Topical BID  . pravastatin  40 mg Oral q1800  . Ensure Max Protein  11 oz Oral BID  . tamsulosin  0.4 mg Oral Daily  . tobramycin   Both Eyes TID   Continuous Infusions:  PRN Meds:.acetaminophen **OR** acetaminophen, diclofenac Sodium, lidocaine (PF), LORazepam, meloxicam,  methocarbamol, [DISCONTINUED] ondansetron **OR** ondansetron (ZOFRAN) IV, polyethylene glycol, traMADol   PHYSICAL EXAM: Vital signs: Vitals:   11/17/20 1838 11/17/20 2029 11/18/20 0502 11/18/20 0904  BP: (!) 142/77 111/69 (!) 148/73 (!) 153/80  Pulse: 85 96 80   Resp: 16 20 18    Temp: (!) 97.5 F (36.4 C) 98.1 F (36.7 C) 97.8 F (36.6 C)   TempSrc: Oral Oral Oral   SpO2: 100% 100% 97%   Weight:      Height:       Filed Weights   11/12/20 2016 11/15/20 2016 11/16/20 2122  Weight: 132.5 kg 130.3 kg 130.4 kg   Body mass index is 40.1 kg/m.   Gen Exam:  Awake Alert, strength in upper extremities 5/5, right lower extremity 4/5, left lower leg 3/5, R forearm IV site under bandage, left  eye lids are swollen but improving,   [place].AT,PERRAL Supple Neck,No JVD, No cervical lymphadenopathy appriciated.  Symmetrical Chest wall movement, Good air movement bilaterally, CTAB RRR,No Gallops, Rubs or new Murmurs, No Parasternal Heave +ve B.Sounds, Abd Soft, No tenderness, No organomegaly appriciated, No rebound - guarding or rigidity. No Cyanosis, Clubbing or edema, No new Rash or bruise   I have personally reviewed following labs and imaging studies  LABORATORY DATA: CBC: Recent Labs  Lab 11/14/20 0251 11/15/20 0514 11/16/20 0431 11/17/20 0427 11/18/20 0446  WBC 8.2 8.5 7.3 7.4 6.7  NEUTROABS 4.8 5.8 4.8 4.2 4.3  HGB 13.1 12.6* 12.2* 12.6* 12.2*  HCT 40.2 37.7* 35.4* 38.2* 36.5*  MCV 91.6 90.0 88.9 91.6 90.6  PLT 204 208 213 247 585    Basic Metabolic Panel: Recent Labs  Lab 11/14/20 0251 11/14/20 1424 11/15/20 0514 11/16/20 0431 11/17/20 0427 11/18/20 0446  NA 133* 136 135 132* 137 136  K 4.0 3.9 4.2 4.2 4.0 4.1  CL 104 105 106 104 107 107  CO2 23 24 24 22 25 24   GLUCOSE 250* 232* 196* 255* 138* 171*  BUN 17 17 18 18 18 18   CREATININE 0.83 0.85 0.87 0.84 0.86 0.82  CALCIUM 8.9 8.9 9.0 9.0 8.8* 9.1  MG 1.8  --  1.7 1.8 1.8 1.8    GFR: Estimated Creatinine  Clearance: 136.8 mL/min (by C-G formula based on SCr of 0.82 mg/dL).  Liver Function Tests: Recent Labs  Lab 11/14/20 0251 11/15/20 0514 11/16/20 0431 11/17/20 0427 11/18/20 0446  AST 13* 10* 11* 18 15  ALT 19 13 16 20 19   ALKPHOS 46 32* 42 30* 40  BILITOT 0.2* 0.7 0.6 0.8 0.7  PROT 5.2* 4.9* 4.9* 5.1* 5.2*  ALBUMIN 3.5 3.8 3.4* 4.0 3.8   No results for input(s): LIPASE, AMYLASE in the last 168 hours. No results for input(s): AMMONIA in the last 168 hours.  Coagulation Profile: No results for input(s): INR, PROTIME in the last 168 hours.  Cardiac Enzymes: No results for input(s): CKTOTAL, CKMB, CKMBINDEX, TROPONINI in the last 168 hours.  BNP (last 3 results) No results for input(s): PROBNP in the last 8760 hours.  Lipid Profile: No results for input(s): CHOL, HDL, LDLCALC, TRIG, CHOLHDL, LDLDIRECT in the last 72 hours.  Thyroid Function Tests: No results for input(s): TSH, T4TOTAL, FREET4, T3FREE, THYROIDAB in the last 72 hours.  Anemia Panel: No results for input(s): VITAMINB12, FOLATE, FERRITIN, TIBC, IRON, RETICCTPCT in the last 72 hours.  Urine analysis:    Component Value Date/Time   COLORURINE YELLOW 11/05/2020 2029   APPEARANCEUR HAZY (A) 11/05/2020 2029   LABSPEC 1.028 11/05/2020 2029   PHURINE 5.0 11/05/2020 2029   GLUCOSEU >=500 (A) 11/05/2020 2029   GLUCOSEU 100 (A) 09/25/2016 1041   HGBUR NEGATIVE 11/05/2020 2029   BILIRUBINUR NEGATIVE 11/05/2020 2029   BILIRUBINUR neg 04/26/2016 0836   KETONESUR NEGATIVE 11/05/2020 2029   PROTEINUR NEGATIVE 11/05/2020 2029   UROBILINOGEN 0.2 09/25/2016 1041   NITRITE NEGATIVE 11/05/2020 2029   LEUKOCYTESUR NEGATIVE 11/05/2020 2029    Sepsis Labs: Lactic Acid, Venous    Component Value Date/Time   LATICACIDVEN 1.3 01/21/2020 0915    MICROBIOLOGY: Recent Results (from the past 240 hour(s))  CSF culture w Gram Stain     Status: None   Collection Time: 11/09/20  9:38 AM   Specimen: PATH Cytology CSF;  Cerebrospinal Fluid  Result Value Ref Range Status   Specimen Description CSF  Final   Special  Requests NONE  Final   Gram Stain   Final    WBC PRESENT, PREDOMINANTLY MONONUCLEAR NO ORGANISMS SEEN CYTOSPIN SMEAR    Culture   Final    NO GROWTH Performed at [facility], [place] [address], [place], [place] [address]    Report Status 11/12/2020 FINAL  Final    RADIOLOGY STUDIES/RESULTS: No results found.   LOS: 12 days   Lala Lund, MD  [facility]  11/18/2020, 12:05 PM

## 2020-11-18 NOTE — Progress Notes (Signed)
NEUROLOGY CONSULTATION PROGRESS NOTE   Date of service: November 18, 2020 Patient Name: Tyler Brewer MRN:  [id number] DOB:  1960/11/23  Brief HPI  Tyler Brewer is a 60 y.o. 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 who presents with recurrent fallsdue to progressive right bilateral extremity weakness.Hisneurologic examination is notable for left greater than right bilateral lower extremity weakness with sensory neuropathy.Spine imaging and MR neurography was non revealing. CSF studies with albuminocytologic dissociation and treated for CIDP with PLEX x 5.   Interval Hx   Will receive his last plasma exchange today.Strenght has not improved much. He reports that he feels like he has some increase in strength (approximatel 15-20%). He reports his left eye is still very painful with difficulty opening it.  Vitals   Vitals:   11/17/20 1838 11/17/20 2029 11/18/20 0502 11/18/20 0904  BP: (!) 142/77 111/69 (!) 148/73 (!) 153/80  Pulse: 85 96 80   Resp: 16 20 18    Temp: (!) 97.5 F (36.4 C) 98.1 F (36.7 C) 97.8 F (36.6 C)   TempSrc: Oral Oral Oral   SpO2: 100% 100% 97%   Weight:      Height:         Body mass index is 40.1 kg/m.  Physical Exam   General: Laying comfortably in bed; in no acute distress. HENT: Normal oropharynx and mucosa. Normal external appearance of ears and nose. Neck: Supple, no pain or tenderness CV: No JVD. No peripheral edema. Pulmonary: Symmetric Chest rise. Normal respiratory effort. Abdomen: Soft to touch, non-tender. Ext: No cyanosis or deformity. Does  have bilateral edema Skin: No rash. Normal palpation of skin.  Musculoskeletal: Normal digits and nails by inspection. No clubbing.  Neurologic Examination  Mental status/Cognition: Alert, oriented to self, place, month and year, good attention.  Speech/language: Fluent, comprehension intact, object naming intact, repetition intact.  Cranial nerves:   CN II Declined to let me evaluate his eyes due to pain   CN III,IV,VI Declined to let me evaluate his eyes due to pain   CN V normal sensation in V1, V2, and V3 segments bilaterally   CN VII no asymmetry, no nasolabial fold flattening   CN VIII normal hearing to speech   CN IX & X normal palatal elevation, no uvular deviation   CN XI 5/5 head turn and 5/5 shoulder shrug bilaterally   CN XII midline tongue protrusion   Motor:  Muscle bulk: normal, tone normal, pronator drift none  Mvmt Root Nerve  Muscle Right Left Comments  SA C5/6 Ax Deltoid 5 5   EF C5/6 Mc Biceps 5 5   EE C6/7/8 Rad Triceps 5 5   WF C6/7 Med FCR     WE C7/8 PIN ECU     F Ab C8/T1 U ADM/FDI 5 5   HF L1/2/3 Fem Illopsoas 3+ 3   KE L2/3/4 Fem Quad 4 4   DF L4/5 D Peron Tib Ant 4 2   PF S1/2 Tibial Grc/Sol 4 2    Reflexes:  Right Left Comments  Pectoralis      Biceps (C5/6) 2 2   Brachioradialis (C5/6) 2 2    Triceps (C6/7) 2 2  Patellar (L3/4) 2 2    Achilles (S1) 0 0    Hoffman      Plantar     Jaw jerk     Sensation:  Light touch Intact throughout   Pin prick    Temperature    Vibration   Proprioception    Coordination/Complex Motor:  - Finger to Nose intact BL - Heel to shin unable to do - Rapid alternating movement are normal in BL upper extremities. - Gait: Deferred.  Labs   Basic Metabolic Panel:  Lab Results  Component Value Date   NA 136 11/18/2020   K 4.1 11/18/2020   CO2 24 11/18/2020   GLUCOSE 171 (H) 11/18/2020   BUN 18 11/18/2020   CREATININE 0.82 11/18/2020   CALCIUM 9.1  11/18/2020   GFRNONAA >60 11/18/2020   GFRAA >60 01/21/2020   HbA1c:  Lab Results  Component Value Date   HGBA1C 10.6 (H) 11/05/2020   LDL:  Lab Results  Component Value Date   LDLCALC 83 07/05/2020   Urine Drug Screen:     Component Value Date/Time   LABOPIA NONE DETECTED 07/07/2020 2120   COCAINSCRNUR NONE DETECTED 07/07/2020 2120   LABBENZ NONE DETECTED 07/07/2020 2120   AMPHETMU POSITIVE (A) 07/07/2020 2120   THCU NONE DETECTED 07/07/2020 2120   LABBARB NONE DETECTED 07/07/2020 2120    Alcohol Level     Component Value Date/Time   ETH <10 07/07/2020 2132   No results found for: PHENYTOIN, ZONISAMIDE, LAMOTRIGINE, LEVETIRACETA No results found for: PHENYTOIN, PHENOBARB, VALPROATE, CBMZ  Imaging and Diagnostic studies  Results for orders placed during the hospital encounter of 11/01/20  CT Head Wo Contrast  Narrative CLINICAL DATA:  Neck trauma. [place] getting out of bathtub hitting head.  EXAM: CT HEAD WITHOUT CONTRAST  CT CERVICAL SPINE WITHOUT CONTRAST  TECHNIQUE: Multidetector CT imaging of the head and cervical spine was performed following the standard protocol without intravenous contrast. Multiplanar CT image reconstructions of the cervical spine were also generated.  COMPARISON:  11/04/2018  FINDINGS: CT HEAD FINDINGS  Brain: No evidence of acute infarction, hemorrhage, hydrocephalus, extra-axial collection or mass lesion/mass effect. There is mild diffuse low-attenuation within the subcortical and periventricular white matter compatible with chronic microvascular disease. Remote lacunar infarct within the right frontal lobe periventricular white matter, image 21/2. This is new when compared with 11/04/2019  Vascular: No hyperdense vessel or unexpected calcification.  Skull: Normal. Negative for fracture or focal lesion.  Sinuses/Orbits: No acute finding.  Other: None  CT CERVICAL SPINE FINDINGS  Alignment: Normal alignment.  Skull  base and vertebrae: The inferior margin of the C7 vertebra is excluded from field of view. Imaged portions of the cervical vertebra are intact without signs of acute fracture or dislocation.  Soft tissues and spinal canal: No prevertebral fluid or swelling. No visible canal hematoma.  Disc levels: Mild disc space narrowing and endplate spurring noted at C4-5 and C6-7  Upper chest: Negative.  Other: None  IMPRESSION: 1. No acute intracranial abnormalities. 2. Chronic small vessel ischemic change and brain atrophy. 3. No evidence for cervical spine fracture. 4. Mild cervical degenerative disc disease.   Electronically Signed By: Kerby Moors M.D. On: 11/01/2020 16:55   Impression   Landon Bassford is a 60 y.o. male with PMH significant for coronary artery disease, depression, diabetes, narcotic addiction, hyperlipidemia, hypertension, obesity, neuropathy, OSA who presents to emergency department with recurrent fallsdue to progressive right bilateral extremity weakness.Hisneurologic examination is notable for left greater than  right bilateral lower extremity weakness with sensory neuropathy.Spine imaging and MR neurography was non revealing. CSF studies with albuminocytologic dissociation and treated for CIDP with PLEX x 5.  Reports subjective improvement in weakness but objectively, exam is only slightly improved.  Recommendations  He should undergo EMG Nerve Conduction Study Agree with PT/OT recommendation of SNF for strengthening He should follow up with his outpatient Neurologist Dr. Posey [facility] will sign off ______________________________________________________________________   Thank you for the opportunity to take part in the care of this patient. If you have any further questions, please contact the neurology consultation attending.  Signed,  [place] Pager Number [phone]

## 2020-11-18 NOTE — TOC Progression Note (Signed)
Transition of Care [facility]) - Progression Note    Patient Details  Name: [name] MRN: [id number] Date of Birth: 1961/08/20  [facility] CM/SW Contact  Sharlet Salina Mila Homer, LCSW Phone Number: 11/18/2020, 3:23 PM  Clinical Narrative:  CSW talked with [facility] admissions director [facility] regarding patient. She was informed that patient is receiving plasma pheresis treatments that will continue through next week and the  Monday following.  CSW will continue to follow and provide SW intervention services as needed through discharge to SNF - [facility].    Expected Discharge Plan: [place] Barriers to Discharge: Continued Medical Work up  Expected Discharge Plan and Services Expected Discharge Plan: [place] In-house Referral: Clinical Social Work     Living arrangements for the past 2 months: Mobile Home                                     Social Determinants of Health (SDOH) Interventions  No SDOH interventions requested or needed at this time.  Readmission Risk Interventions No flowsheet data found.

## 2020-11-18 NOTE — Progress Notes (Signed)
physical therapy reported to RN that this patient had a large bowel movement with a smear of blood present, MD was notified and RN will continue to monitor this patient.

## 2020-11-18 NOTE — Progress Notes (Signed)
DISCHARGE NOTE SNF [facility] to be discharged Skilled nursing facility per MD order. Patient verbalized understanding.  Skin clean, dry and intact without evidence of skin break down, no evidence of skin tears noted. IV catheter discontinued intact. Site without signs and symptoms of complications. Dressing and pressure applied. Pt denies pain at the site currently. No complaints noted.  Patient free of lines, drains, and wounds.   Discharge packet assembled. An After Visit Summary (AVS) was printed and given to the EMS personnel. Patient escorted via stretcher and discharged to [facility] via ambulance. Report called to accepting facility; all questions and concerns addressed.   Beatris Ship, RN
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
## 2020-11-19 DIAGNOSIS — E1140 Type 2 diabetes mellitus with diabetic neuropathy, unspecified: Secondary | ICD-10-CM | POA: Diagnosis not present

## 2020-11-19 LAB — CBC WITH DIFFERENTIAL/PLATELET
Abs Immature Granulocytes: 0.04 10*3/uL (ref 0.00–0.07)
Basophils Absolute: 0.1 10*3/uL (ref 0.0–0.1)
Basophils Relative: 1 %
Eosinophils Absolute: 0.2 10*3/uL (ref 0.0–0.5)
Eosinophils Relative: 3 %
HCT: 36 % — ABNORMAL LOW (ref 39.0–52.0)
Hemoglobin: 12.5 g/dL — ABNORMAL LOW (ref 13.0–17.0)
Immature Granulocytes: 1 %
Lymphocytes Relative: 24 %
Lymphs Abs: 1.9 10*3/uL (ref 0.7–4.0)
MCH: 31 pg (ref 26.0–34.0)
MCHC: 34.7 g/dL (ref 30.0–36.0)
MCV: 89.3 fL (ref 80.0–100.0)
Monocytes Absolute: 0.7 10*3/uL (ref 0.1–1.0)
Monocytes Relative: 10 %
Neutro Abs: 4.8 10*3/uL (ref 1.7–7.7)
Neutrophils Relative %: 61 %
Platelets: 234 10*3/uL (ref 150–400)
RBC: 4.03 MIL/uL — ABNORMAL LOW (ref 4.22–5.81)
RDW: 12.5 % (ref 11.5–15.5)
WBC: 7.7 10*3/uL (ref 4.0–10.5)
nRBC: 0 % (ref 0.0–0.2)

## 2020-11-19 LAB — COMPREHENSIVE METABOLIC PANEL
ALT: 14 U/L (ref 0–44)
AST: 12 U/L — ABNORMAL LOW (ref 15–41)
Albumin: 4 g/dL (ref 3.5–5.0)
Alkaline Phosphatase: 27 U/L — ABNORMAL LOW (ref 38–126)
Anion gap: 11 (ref 5–15)
BUN: 16 mg/dL (ref 6–20)
CO2: 17 mmol/L — ABNORMAL LOW (ref 22–32)
Calcium: 8.9 mg/dL (ref 8.9–10.3)
Chloride: 103 mmol/L (ref 98–111)
Creatinine, Ser: 0.86 mg/dL (ref 0.61–1.24)
GFR, Estimated: >60 mL/min (ref >60)
Glucose, Bld: 216 mg/dL — ABNORMAL HIGH (ref 70–99)
Potassium: 4 mmol/L (ref 3.5–5.1)
Sodium: 131 mmol/L — ABNORMAL LOW (ref 135–145)
Total Bilirubin: 0.6 mg/dL (ref 0.3–1.2)
Total Protein: 5 g/dL — ABNORMAL LOW (ref 6.5–8.1)

## 2020-11-19 LAB — URINALYSIS, ROUTINE W REFLEX MICROSCOPIC
Bilirubin Urine: NEGATIVE
Glucose, UA: 150 mg/dL — AB
Hgb urine dipstick: NEGATIVE
Ketones, ur: NEGATIVE mg/dL
Leukocytes,Ua: NEGATIVE
Nitrite: NEGATIVE
Protein, ur: NEGATIVE mg/dL
Specific Gravity, Urine: 1.014 (ref 1.005–1.030)
pH: 5 (ref 5.0–8.0)

## 2020-11-19 LAB — GLUCOSE, CAPILLARY
Glucose-Capillary: 175 mg/dL — ABNORMAL HIGH (ref 70–99)
Glucose-Capillary: 162 mg/dL — ABNORMAL HIGH (ref 70–99)
Glucose-Capillary: 86 mg/dL (ref 70–99)
Glucose-Capillary: 128 mg/dL — ABNORMAL HIGH (ref 70–99)

## 2020-11-19 LAB — URIC ACID: Uric Acid, Serum: 4.9 mg/dL (ref 3.7–8.6)

## 2020-11-19 LAB — SODIUM, URINE, RANDOM: Sodium, Ur: 134 mmol/L

## 2020-11-19 LAB — OSMOLALITY, URINE: Osmolality, Ur: 587 mOsm/kg (ref 300–900)

## 2020-11-19 MED ORDER — FUROSEMIDE 40 MG PO TABS: 40.0000 mg | ORAL_TABLET | Freq: Once | ORAL | Status: DC

## 2020-11-19 MED ORDER — FUROSEMIDE 10 MG/ML IJ SOLN: 20.0000 mg | Freq: Once | INTRAMUSCULAR | Status: DC

## 2020-11-19 MED ORDER — FUROSEMIDE 20 MG PO TABS
20.0000 mg | ORAL_TABLET | Freq: Once | ORAL | Status: AC
  Administered 2020-11-19: 20 mg via ORAL
  Filled 2020-11-19: qty 1

## 2020-11-19 MED ORDER — TOBRAMYCIN 0.3 % OP OINT
TOPICAL_OINTMENT | Freq: Three times a day (TID) | OPHTHALMIC | Status: DC
  Administered 2020-11-19 – 2020-11-20 (×2): 1 via OPHTHALMIC
  Filled 2020-11-19: qty 3.5

## 2020-11-19 NOTE — Progress Notes (Signed)
Patient non compliant with using call bell, RN educated patient and will continue to monitor this patient.

## 2020-11-19 NOTE — Progress Notes (Signed)
[facility] night shift.  The patient declined the IV furosemide, but I agree to take it orally.  Furosemide 20 mg p.o. ordered.  The nursing staff also stated that his blood pressure has been soft so we will hold antihypertensives tonight.  Tennis Must, MD.

## 2020-11-19 NOTE — Plan of Care (Signed)

## 2020-11-19 NOTE — Progress Notes (Signed)
PROGRESS NOTE        PATIENT DETAILS Name: Tyler Brewer Age: 60 y.o. Sex: male Date of Birth: October 19, 1960 Admit Date: 11/04/2020 Admitting Physician Etta Quill, DO AOZ:HYQMV Koren Shiver, DO  Brief Narrative: Patient is a 60 y.o. male with history of DM-2, CAD, OSA-presenting to the hospital with frequent falls-patient has had progressive bilateral lower extremity weakness since August 2021.  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  See below for further details  Significant events: 3/18>> admit for lower extremity weakness 3/25>> PLEX  started  Significant studies: 2/23>> MRI thoracic spine: No acute abnormalities noted. 3/19>> MRI lumbar sacral spine: No acute process identified. 3/19>> MRI C-spine: Small disc herniation at C3-C4, C4-C5-no abnormal cord signal. 3/19>> vitamin B12: 538 3/19>> HIV: Nonreactive 3/19>> TSH: Normal limits 3/19>> vitamin B6: Pending 3/19>> HSV 1/HSV 2 DNA by PCR blood: Negative 3/19>> CMV DNA by PCR blood>> negative 3/20>> MRI T-spine with contrast: Normal-appearing thoracic cord 3/20>> MRI C-spine: Normal-appearing cervical cord-no pathologic enhancement. 3/23>> CSF: WBC 3, protein 124 3/23>> arbovirus panel- CSF: Pending   Antimicrobial therapy: None  Microbiology data: 3/23>> CSF culture: No growth  Procedures : 3/23>> fluoroscopy-guided lumbar puncture  Consults: Neurology  DVT Prophylaxis : Prophylactic Lovenox   Subjective:  Patient in bed, appears comfortable, denies any headache, no fever, no chest pain or pressure, no shortness of breath , no abdominal pain.  Still has considerable tenderness in lower extremities with some improvement.   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  Continue PT - OT May require placement. DW Neuro 11/15/20, 11/19/20.  He remains noncompliant with PT, has been strictly counseled.  Right arm with previous IV site redness improved superficial thrombophlebitis.  Soft tissue & Venous ultrasound stable, continue with Warm compress.  Monitor.  Left eye swelling likely chemosis - seen by ophthalmologist with Dr. Ellie Lunch, supportive care and monitor.  Noncompliance with medical recommendations.  Patient counseled and warned of consequences, he understands and assumes all responsibility of any adverse outcome.  Peripheral diabetic nephropathy: Continue Neurontin/Mobic and Cymbalta.  HTN: On combination of losartan, Norvasc and have added hydralazine, monitor.  HLD: Continue statin  BPH: Continue Flomax  Hyponatremia: Appears to be mild-and chronic-continue to follow periodically.  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  DM-2 (A1c 10.6 on 3/19): On Lantus along with premeal NovoLog and sliding scale, Lantus increased for better control on 11/13/2020.  Poor outpatient control due to hyperglycemia.  CBG (last 3)  Recent Labs    11/18/20 1756 11/18/20 2227 11/19/20 0646  GLUCAP 97 173* 175*   Obesity: Body mass index is 40.1 kg/m - follow with PCP.    Diet: Diet Order            Diet Carb Modified Fluid consistency: Thin; Room service appropriate? Yes  Diet effective now                  Code Status: Full code   Family Communication:  Mother [name] [phone] on 11/12/20  Disposition Plan: Status is: Inpatient  The patient will require care spanning > 2 midnights and should be moved to  inpatient because: Inpatient level of care appropriate due to severity of illness  Dispo: The patient is from: Home              Anticipated d/c is to: To be determined              Patient currently is not medically stable to d/c.   Difficult to place  patient No    Barriers to Discharge: CIDP-starting PLEX 3/25  Antimicrobial agents: Anti-infectives (From admission, onward)   Start     Dose/Rate Route Frequency Ordered Stop   11/14/20 2100  vancomycin (VANCOREADY) IVPB 1500 mg/300 mL  Status:  Discontinued        1,500 mg 150 mL/hr over 120 Minutes Intravenous Every 12 hours 11/14/20 0757 11/14/20 0927   11/14/20 1000  doxycycline (VIBRA-TABS) tablet 100 mg        100 mg Oral Every 12 hours 11/14/20 0907     11/14/20 0845  vancomycin (VANCOREADY) IVPB 2000 mg/400 mL  Status:  Discontinued        2,000 mg 200 mL/hr over 120 Minutes Intravenous  Once 11/14/20 0757 11/14/20 0927   11/13/20 1030  fluconazole (DIFLUCAN) tablet 100 mg        100 mg Oral Daily 11/13/20 0941 11/19/20 1010       Time spent: 25- minutes-Greater than 50% of this time was spent in counseling, explanation of diagnosis, planning of further management, and coordination of care.  MEDICATIONS: Scheduled Meds: . amLODipine  5 mg Oral Daily  . Chlorhexidine Gluconate Cloth  6 each Topical Daily  . doxycycline  100 mg Oral Q12H  . DULoxetine  30 mg Oral Once per day on Mon Wed Fri  . enoxaparin (LOVENOX) injection  60 mg Subcutaneous Q24H  . gabapentin  300 mg Oral 2 times per day  . gabapentin  600 mg Oral Daily  . hydrALAZINE  50 mg Oral Q8H  . insulin aspart  0-15 Units Subcutaneous TID WC  . insulin aspart  8 Units Subcutaneous TID WC  . insulin glargine  50 Units Subcutaneous Daily  . losartan  100 mg Oral Daily  . multivitamin with minerals  1 tablet Oral Daily  . nystatin   Topical BID  . pravastatin  40 mg Oral q1800  . Ensure Max Protein  11 oz Oral BID  . tamsulosin  0.4 mg Oral Daily  . tobramycin   Both Eyes TID   Continuous Infusions:  PRN Meds:.acetaminophen **OR** acetaminophen, diclofenac Sodium, lidocaine (PF), LORazepam, meloxicam, methocarbamol, [DISCONTINUED] ondansetron **OR** ondansetron (ZOFRAN) IV, polyethylene glycol,  traMADol   PHYSICAL EXAM: Vital signs: Vitals:   11/18/20 1754 11/19/20 0645 11/19/20 0955 11/19/20 1010  BP: 104/63 137/67 123/83 132/78  Pulse: 85 89 95   Resp: 16 18 19    Temp: (!) 97.4 F (36.3 C) 98.3 F (36.8 C) 98.4 F (36.9 C)   TempSrc:  Oral    SpO2: 98% 97% 98%   Weight:      Height:       Filed Weights   11/12/20 2016 11/15/20 2016 11/16/20 2122  Weight: 132.5 kg 130.3 kg 130.4 kg   Body mass index is 40.1 kg/m.   Gen Exam:  Awake Alert, strength in upper extremities 5/5, right lower extremity 4/5, left lower leg 3/5, R forearm IV site under bandage, left eye lids are swollen but improving,   Saks.AT,  Supple Neck,No JVD, No cervical lymphadenopathy appriciated.  Symmetrical Chest wall movement, Good air movement  bilaterally, CTAB RRR,No Gallops, Rubs or new Murmurs, No Parasternal Heave +ve B.Sounds, Abd Soft, No tenderness, No organomegaly appriciated, No rebound - guarding or rigidity. No Cyanosis, Clubbing or edema, No new Rash or bruise    I have personally reviewed following labs and imaging studies  LABORATORY DATA: CBC: Recent Labs  Lab 11/15/20 0514 11/16/20 0431 11/17/20 0427 11/18/20 0446 11/19/20 0541  WBC 8.5 7.3 7.4 6.7 7.7  NEUTROABS 5.8 4.8 4.2 4.3 4.8  HGB 12.6* 12.2* 12.6* 12.2* 12.5*  HCT 37.7* 35.4* 38.2* 36.5* 36.0*  MCV 90.0 88.9 91.6 90.6 89.3  PLT 208 213 247 249 814    Basic Metabolic Panel: Recent Labs  Lab 11/14/20 0251 11/14/20 1424 11/15/20 0514 11/16/20 0431 11/17/20 0427 11/18/20 0446 11/19/20 0541  NA 133*   < > 135 132* 137 136 131*  K 4.0   < > 4.2 4.2 4.0 4.1 4.0  CL 104   < > 106 104 107 107 103  CO2 23   < > 24 22 25 24  17*  GLUCOSE 250*   < > 196* 255* 138* 171* 216*  BUN 17   < > 18 18 18 18 16   CREATININE 0.83   < > 0.87 0.84 0.86 0.82 0.86  CALCIUM 8.9   < > 9.0 9.0 8.8* 9.1 8.9  MG 1.8  --  1.7 1.8 1.8 1.8  --    < > = values in this interval not displayed.    GFR: Estimated Creatinine  Clearance: 130.4 mL/min (by C-G formula based on SCr of 0.86 mg/dL).  Liver Function Tests: Recent Labs  Lab 11/15/20 0514 11/16/20 0431 11/17/20 0427 11/18/20 0446 11/19/20 0541  AST 10* 11* 18 15 12*  ALT 13 16 20 19 14   ALKPHOS 32* 42 30* 40 27*  BILITOT 0.7 0.6 0.8 0.7 0.6  PROT 4.9* 4.9* 5.1* 5.2* 5.0*  ALBUMIN 3.8 3.4* 4.0 3.8 4.0   No results for input(s): LIPASE, AMYLASE in the last 168 hours. No results for input(s): AMMONIA in the last 168 hours.  Coagulation Profile: No results for input(s): INR, PROTIME in the last 168 hours.  Cardiac Enzymes: No results for input(s): CKTOTAL, CKMB, CKMBINDEX, TROPONINI in the last 168 hours.  BNP (last 3 results) No results for input(s): PROBNP in the last 8760 hours.  Lipid Profile: No results for input(s): CHOL, HDL, LDLCALC, TRIG, CHOLHDL, LDLDIRECT in the last 72 hours.  Thyroid Function Tests: No results for input(s): TSH, T4TOTAL, FREET4, T3FREE, THYROIDAB in the last 72 hours.  Anemia Panel: No results for input(s): VITAMINB12, FOLATE, FERRITIN, TIBC, IRON, RETICCTPCT in the last 72 hours.  Urine analysis:    Component Value Date/Time   COLORURINE YELLOW 11/05/2020 2029   APPEARANCEUR HAZY (A) 11/05/2020 2029   LABSPEC 1.028 11/05/2020 2029   PHURINE 5.0 11/05/2020 2029   GLUCOSEU >=500 (A) 11/05/2020 2029   GLUCOSEU 100 (A) 09/25/2016 1041   HGBUR NEGATIVE 11/05/2020 2029   BILIRUBINUR NEGATIVE 11/05/2020 2029   BILIRUBINUR neg 04/26/2016 0836   KETONESUR NEGATIVE 11/05/2020 2029   PROTEINUR NEGATIVE 11/05/2020 2029   UROBILINOGEN 0.2 09/25/2016 1041   NITRITE NEGATIVE 11/05/2020 2029   LEUKOCYTESUR NEGATIVE 11/05/2020 2029    Sepsis Labs: Lactic Acid, Venous    Component Value Date/Time   LATICACIDVEN 1.3 01/21/2020 0915    MICROBIOLOGY: No results found for this or any previous visit (from the past 240 hour(s)).  RADIOLOGY STUDIES/RESULTS: No results found.   LOS: 13 days  Lala Lund,  MD  [facility]  11/19/2020, 11:24 AM

## 2020-11-20 DIAGNOSIS — E1140 Type 2 diabetes mellitus with diabetic neuropathy, unspecified: Secondary | ICD-10-CM | POA: Diagnosis not present

## 2020-11-20 LAB — COMPREHENSIVE METABOLIC PANEL
ALT: 19 U/L (ref 0–44)
AST: 17 U/L (ref 15–41)
Albumin: 3.7 g/dL (ref 3.5–5.0)
Alkaline Phosphatase: 35 U/L — ABNORMAL LOW (ref 38–126)
Anion gap: 6 (ref 5–15)
BUN: 22 mg/dL — ABNORMAL HIGH (ref 6–20)
CO2: 23 mmol/L (ref 22–32)
Calcium: 8.9 mg/dL (ref 8.9–10.3)
Chloride: 105 mmol/L (ref 98–111)
Creatinine, Ser: 1.11 mg/dL (ref 0.61–1.24)
GFR, Estimated: >60 mL/min (ref >60)
Glucose, Bld: 155 mg/dL — ABNORMAL HIGH (ref 70–99)
Potassium: 4 mmol/L (ref 3.5–5.1)
Sodium: 134 mmol/L — ABNORMAL LOW (ref 135–145)
Total Bilirubin: 0.3 mg/dL (ref 0.3–1.2)
Total Protein: 5 g/dL — ABNORMAL LOW (ref 6.5–8.1)

## 2020-11-20 LAB — CBC WITH DIFFERENTIAL/PLATELET
Abs Immature Granulocytes: 0.03 10*3/uL (ref 0.00–0.07)
Basophils Absolute: 0 10*3/uL (ref 0.0–0.1)
Basophils Relative: 1 %
Eosinophils Absolute: 0.3 10*3/uL (ref 0.0–0.5)
Eosinophils Relative: 4 %
HCT: 34.6 % — ABNORMAL LOW (ref 39.0–52.0)
Hemoglobin: 11.4 g/dL — ABNORMAL LOW (ref 13.0–17.0)
Immature Granulocytes: 0 %
Lymphocytes Relative: 28 %
Lymphs Abs: 2 10*3/uL (ref 0.7–4.0)
MCH: 29.8 pg (ref 26.0–34.0)
MCHC: 32.9 g/dL (ref 30.0–36.0)
MCV: 90.3 fL (ref 80.0–100.0)
Monocytes Absolute: 0.8 10*3/uL (ref 0.1–1.0)
Monocytes Relative: 11 %
Neutro Abs: 4.2 10*3/uL (ref 1.7–7.7)
Neutrophils Relative %: 56 %
Platelets: 246 10*3/uL (ref 150–400)
RBC: 3.83 MIL/uL — ABNORMAL LOW (ref 4.22–5.81)
RDW: 12.4 % (ref 11.5–15.5)
WBC: 7.3 10*3/uL (ref 4.0–10.5)
nRBC: 0 % (ref 0.0–0.2)

## 2020-11-20 LAB — GLUCOSE, CAPILLARY
Glucose-Capillary: 143 mg/dL — ABNORMAL HIGH (ref 70–99)
Glucose-Capillary: 145 mg/dL — ABNORMAL HIGH (ref 70–99)
Glucose-Capillary: 119 mg/dL — ABNORMAL HIGH (ref 70–99)
Glucose-Capillary: 159 mg/dL — ABNORMAL HIGH (ref 70–99)
Glucose-Capillary: 190 mg/dL — ABNORMAL HIGH (ref 70–99)

## 2020-11-20 LAB — MAGNESIUM: Magnesium: 1.8 mg/dL (ref 1.7–2.4)

## 2020-11-20 LAB — OSMOLALITY: Osmolality: 283 mOsm/kg (ref 275–295)

## 2020-11-20 MED ORDER — FAMOTIDINE 20 MG PO TABS: 20.0000 mg | ORAL_TABLET | Freq: Two times a day (BID) | ORAL | Status: DC

## 2020-11-20 MED ORDER — FAMOTIDINE 20 MG PO TABS
20.0000 mg | ORAL_TABLET | Freq: Two times a day (BID) | ORAL | Status: DC
  Administered 2020-11-20 – 2020-11-21 (×3): 20 mg via ORAL
  Filled 2020-11-20 (×3): qty 1

## 2020-11-20 MED ORDER — CALCIUM CARBONATE ANTACID 500 MG PO CHEW
1.0000 | CHEWABLE_TABLET | Freq: Once | ORAL | Status: AC
  Administered 2020-11-20: 200 mg via ORAL
  Filled 2020-11-20: qty 1

## 2020-11-20 MED ORDER — CALCIUM CARBONATE ANTACID 500 MG PO CHEW: 1.0000 | CHEWABLE_TABLET | Freq: Three times a day (TID) | ORAL | Status: DC | PRN

## 2020-11-20 MED ORDER — FUROSEMIDE 20 MG PO TABS
20.0000 mg | ORAL_TABLET | Freq: Once | ORAL | Status: AC
  Administered 2020-11-20: 20 mg via ORAL
  Filled 2020-11-20: qty 1

## 2020-11-20 NOTE — Progress Notes (Signed)
[facility] night shift.  The patient requested Tums as needed for heartburn.  I will order 1 tablet now and then 3 times daily as needed between meals.  I have also added famotidine 20 mg p.o. twice daily since he is taking meloxicam 7.5 mg p.o. twice daily.  Tennis Must, MD.

## 2020-11-20 NOTE — Progress Notes (Signed)
IR requested to removed temporary central cathter from the right IJ. Per MD his therapy is complete. The patient was seen at the bedside on 64M. The procedure was explained and verbal consent obtained to remove catheter. The dressing and bio-patch were removed and the site was prepped with chlorhexadine. The suture was removed followed by the catheter. This was easily removed and pressure was held until hemostasis was achieved. The site was covered with gauze and tape and the patient was instructed to stay in bed for approximately 30-45 min.   The charge nurse was made aware of this procedure.   Please call IR with any questions.  Soyla Dryer, [place] [phone] 11/20/2020, 11:46 AM

## 2020-11-20 NOTE — Progress Notes (Signed)
Lab just  informed me that the serum osmolality entered yesterday was incorrect for this patient and she is about to enter the correct reading, MD has been notified and RN will continue to monitor this patient

## 2020-11-20 NOTE — Progress Notes (Signed)
PROGRESS NOTE        PATIENT DETAILS Name: Tyler Brewer Age: 60 y.o. Sex: male Date of Birth: September 20, 1960 Admit Date: 11/04/2020 Admitting Physician Etta Quill, DO [id number] Koren Shiver, DO  Brief Narrative: Patient is a 60 y.o. male with history of DM-2, CAD, OSA-presenting to the hospital with frequent falls-patient has had progressive bilateral lower extremity weakness since August 2021.  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  See below for further details  Significant events: 3/18>> admit for lower extremity weakness 3/25>> PLEX  started  Significant studies: 2/23>> MRI thoracic spine: No acute abnormalities noted. 3/19>> MRI lumbar sacral spine: No acute process identified. 3/19>> MRI C-spine: Small disc herniation at C3-C4, C4-C5-no abnormal cord signal. 3/19>> vitamin B12: 538 3/19>> HIV: Nonreactive 3/19>> TSH: Normal limits 3/19>> vitamin B6: Pending 3/19>> HSV 1/HSV 2 DNA by PCR blood: Negative 3/19>> CMV DNA by PCR blood>> negative 3/20>> MRI T-spine with contrast: Normal-appearing thoracic cord 3/20>> MRI C-spine: Normal-appearing cervical cord-no pathologic enhancement. 3/23>> CSF: WBC 3, protein 124 3/23>> arbovirus panel- CSF: Pending   Antimicrobial therapy: None  Microbiology data: 3/23>> CSF culture: No growth  Procedures : 3/23>> fluoroscopy-guided lumbar puncture  Consults: Neurology  DVT Prophylaxis : Prophylactic Lovenox   Subjective:  Patient in bed, appears comfortable, denies any headache, no fever, no chest pain or pressure, no shortness of breath , no abdominal pain.  Bilateral leg weakness present but improved as compared to before.    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  Continue PT - OT May require placement. DW Neuro 11/15/20, 11/19/20.  He remains noncompliant with PT, has been strictly counseled.  Right arm with previous IV site redness improved superficial thrombophlebitis.  Soft tissue & Venous ultrasound stable, continue with Warm compress.  Monitor.  Left eye swelling likely chemosis - seen by ophthalmologist with Dr. Ellie Lunch, supportive care and monitor.  Noncompliance with medical recommendations.  Patient counseled and warned of consequences, he understands and assumes all responsibility of any adverse outcome.  Peripheral diabetic nephropathy: Continue Neurontin/Mobic and Cymbalta.  HTN: On combination of losartan, Norvasc and have added hydralazine, monitor.  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  DM-2 (A1c 10.6 on 3/19): On Lantus along with premeal NovoLog and sliding scale, Lantus increased for better control on 11/13/2020.  Poor outpatient control due to hyperglycemia.  CBG (last 3)  Recent Labs    11/19/20 2106 11/20/20 0519 11/20/20 0730  GLUCAP 128* 143* 145*   Obesity: Body mass index is 40.1 kg/m - follow with PCP.    Diet: Diet Order            Diet Carb Modified Fluid consistency: Thin; Room service appropriate? Yes  Diet effective now                  Code Status: Full code   Family Communication:  Mother [name] [phone] on 11/12/20, 11/20/20 message left at 9:55 AM.  Disposition Plan: Status is: Inpatient  The patient will require care spanning > 2 midnights and  should be moved to inpatient because: Inpatient level of care appropriate due to severity of illness  Dispo: The patient is from: Home              Anticipated d/c is to: To be determined              Patient currently is not medically stable to d/c.   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  Antimicrobial agents: Anti-infectives (From admission, onward)   Start     Dose/Rate Route Frequency Ordered Stop   11/14/20 2100  vancomycin (VANCOREADY) IVPB 1500 mg/300 mL  Status:  Discontinued        1,500 mg 150 mL/hr over 120 Minutes Intravenous Every 12 hours 11/14/20 0757 11/14/20 0927   11/14/20 1000  doxycycline (VIBRA-TABS) tablet 100 mg  Status:  Discontinued        100 mg Oral Every 12 hours 11/14/20 0907 11/19/20 1126   11/14/20 0845  vancomycin (VANCOREADY) IVPB 2000 mg/400 mL  Status:  Discontinued        2,000 mg 200 mL/hr over 120 Minutes Intravenous  Once 11/14/20 0757 11/14/20 0927   11/13/20 1030  fluconazole (DIFLUCAN) tablet 100 mg        100 mg Oral Daily 11/13/20 0941 11/19/20 1010       Time spent: 25- minutes-Greater than 50% of this time was spent in counseling, explanation of diagnosis, planning of further management, and coordination of care.  MEDICATIONS: Scheduled Meds: . amLODipine  5 mg Oral Daily  . Chlorhexidine Gluconate Cloth  6 each Topical Daily  . DULoxetine  30 mg Oral Once per day on Mon Wed Fri  . enoxaparin (LOVENOX) injection  60 mg Subcutaneous Q24H  . famotidine  20 mg Oral BID  . gabapentin  300 mg Oral 2 times per day  . gabapentin  600 mg Oral Daily  . hydrALAZINE  50 mg Oral Q8H  . insulin aspart  0-15 Units Subcutaneous TID WC  . insulin aspart  8 Units Subcutaneous TID WC  . insulin glargine  50 Units Subcutaneous Daily  . losartan  100 mg Oral Daily  . multivitamin with minerals  1 tablet Oral Daily  . nystatin   Topical BID  . pravastatin  40 mg Oral q1800  . Ensure Max Protein  11 oz Oral BID  . tamsulosin  0.4 mg Oral Daily  . tobramycin   Left Eye TID   Continuous Infusions:  PRN Meds:.acetaminophen **OR** [DISCONTINUED] acetaminophen, calcium carbonate, diclofenac Sodium, lidocaine (PF), LORazepam, meloxicam, methocarbamol, [DISCONTINUED]  ondansetron **OR** ondansetron (ZOFRAN) IV, polyethylene glycol, traMADol   PHYSICAL EXAM: Vital signs: Vitals:   11/19/20 2100 11/20/20 0515 11/20/20 0924 11/20/20 0950  BP: 100/62 119/64 120/70 (!) 147/75  Pulse: 86 83  82  Resp: 20 20  19   Temp: 97.8 F (36.6 C) 97.7 F (36.5 C)  98.5 F (36.9 C)  TempSrc: Oral Oral    SpO2: 100% 98%  99%  Weight:      Height:       Filed Weights   11/12/20 2016 11/15/20 2016 11/16/20 2122  Weight: 132.5 kg 130.3 kg 130.4 kg   Body mass index is 40.1 kg/m.   Gen Exam:  Awake Alert, strength in upper extremities 5/5, right lower extremity 4/5, left lower leg 3/5, left eye lids are swollen but improving,   [place].AT,  Supple Neck,No JVD, No cervical lymphadenopathy appriciated.  Symmetrical Chest  wall movement, Good air movement bilaterally, CTAB RRR,No Gallops, Rubs or new Murmurs, No Parasternal Heave +ve B.Sounds, Abd Soft, No tenderness, No organomegaly appriciated, No rebound - guarding or rigidity. No Cyanosis, Clubbing or edema, No new Rash or bruise   I have personally reviewed following labs and imaging studies  LABORATORY DATA: CBC: Recent Labs  Lab 11/16/20 0431 11/17/20 0427 11/18/20 0446 11/19/20 0541 11/20/20 0350  WBC 7.3 7.4 6.7 7.7 7.3  NEUTROABS 4.8 4.2 4.3 4.8 4.2  HGB 12.2* 12.6* 12.2* 12.5* 11.4*  HCT 35.4* 38.2* 36.5* 36.0* 34.6*  MCV 88.9 91.6 90.6 89.3 90.3  PLT 213 247 249 234 379    Basic Metabolic Panel: Recent Labs  Lab 11/15/20 0514 11/16/20 0431 11/17/20 0427 11/18/20 0446 11/19/20 0541 11/20/20 0350  NA 135 132* 137 136 131* 134*  K 4.2 4.2 4.0 4.1 4.0 4.0  CL 106 104 107 107 103 105  CO2 24 22 25 24  17* 23  GLUCOSE 196* 255* 138* 171* 216* 155*  BUN 18 18 18 18 16  22*  CREATININE 0.87 0.84 0.86 0.82 0.86 1.11  CALCIUM 9.0 9.0 8.8* 9.1 8.9 8.9  MG 1.7 1.8 1.8 1.8  --  1.8    GFR: Estimated Creatinine Clearance: 101 mL/min (by C-G formula based on SCr of 1.11 mg/dL).  Liver  Function Tests: Recent Labs  Lab 11/16/20 0431 11/17/20 0427 11/18/20 0446 11/19/20 0541 11/20/20 0350  AST 11* 18 15 12* 17  ALT 16 20 19 14 19   ALKPHOS 42 30* 40 27* 35*  BILITOT 0.6 0.8 0.7 0.6 0.3  PROT 4.9* 5.1* 5.2* 5.0* 5.0*  ALBUMIN 3.4* 4.0 3.8 4.0 3.7   No results for input(s): LIPASE, AMYLASE in the last 168 hours. No results for input(s): AMMONIA in the last 168 hours.  Coagulation Profile: No results for input(s): INR, PROTIME in the last 168 hours.  Cardiac Enzymes: No results for input(s): CKTOTAL, CKMB, CKMBINDEX, TROPONINI in the last 168 hours.  BNP (last 3 results) No results for input(s): PROBNP in the last 8760 hours.  Lipid Profile: No results for input(s): CHOL, HDL, LDLCALC, TRIG, CHOLHDL, LDLDIRECT in the last 72 hours.  Thyroid Function Tests: No results for input(s): TSH, T4TOTAL, FREET4, T3FREE, THYROIDAB in the last 72 hours.  Anemia Panel: No results for input(s): VITAMINB12, FOLATE, FERRITIN, TIBC, IRON, RETICCTPCT in the last 72 hours.  Urine analysis:    Component Value Date/Time   COLORURINE YELLOW 11/19/2020 1453   APPEARANCEUR CLEAR 11/19/2020 1453   LABSPEC 1.014 11/19/2020 1453   PHURINE 5.0 11/19/2020 1453   GLUCOSEU 150 (A) 11/19/2020 1453   GLUCOSEU 100 (A) 09/25/2016 1041   HGBUR NEGATIVE 11/19/2020 1453   BILIRUBINUR NEGATIVE 11/19/2020 1453   BILIRUBINUR neg 04/26/2016 0836   KETONESUR NEGATIVE 11/19/2020 1453   PROTEINUR NEGATIVE 11/19/2020 1453   UROBILINOGEN 0.2 09/25/2016 1041   NITRITE NEGATIVE 11/19/2020 1453   LEUKOCYTESUR NEGATIVE 11/19/2020 1453    Sepsis Labs: Lactic Acid, Venous    Component Value Date/Time   LATICACIDVEN 1.3 01/21/2020 0915    MICROBIOLOGY: No results found for this or any previous visit (from the past 240 hour(s)).  RADIOLOGY STUDIES/RESULTS: No results found.   LOS: 14 days   Lala Lund, MD  [facility]  11/20/2020, 9:54 AM

## 2020-11-20 NOTE — Progress Notes (Signed)
2054 - Pt has order for IV lasix to be administered. Pt does not have PIV access, however he does have a trialysis catheter that could be used to administer IV medications. The patient is refusing for any medications to be administered through his trialysis catheter at this time. Pt requesting PO lasix instead of IV. MD notified.  2112 - MD paged for clarification on administration of scheduled hydralazine tonight. BP currently 100/62 MAP 74 - which was the highest of 3 repeated readings.

## 2020-11-20 NOTE — Progress Notes (Signed)
Pt requesting PRN tums for indigestion.   Pt hgb decreased to 11.4 from 12.5 yesterday and mag is currently at 1.8.   MD notified.

## 2020-11-21 DIAGNOSIS — E1140 Type 2 diabetes mellitus with diabetic neuropathy, unspecified: Secondary | ICD-10-CM | POA: Diagnosis not present

## 2020-11-21 DIAGNOSIS — E1165 Type 2 diabetes mellitus with hyperglycemia: Secondary | ICD-10-CM | POA: Diagnosis not present

## 2020-11-21 LAB — GLUCOSE, CAPILLARY: Glucose-Capillary: 142 mg/dL — ABNORMAL HIGH (ref 70–99)

## 2020-11-21 MED ORDER — HYDRALAZINE HCL 50 MG PO TABS: 50.0000 mg | ORAL_TABLET | Freq: Three times a day (TID) | ORAL | 0 refills | Status: DC

## 2020-11-21 MED ORDER — AMLODIPINE BESYLATE 10 MG PO TABS: 10.0000 mg | ORAL_TABLET | Freq: Every day | ORAL | 0 refills | Status: DC

## 2020-11-21 MED ORDER — TOBRAMYCIN 0.3 % OP OINT: TOPICAL_OINTMENT | Freq: Three times a day (TID) | OPHTHALMIC | 0 refills | Status: DC

## 2020-11-21 MED ORDER — AMLODIPINE BESYLATE 10 MG PO TABS
10.0000 mg | ORAL_TABLET | Freq: Every day | ORAL | Status: DC
  Administered 2020-11-21: 10 mg via ORAL

## 2020-11-21 MED ORDER — SODIUM CHLORIDE 0.9% FLUSH
10.0000 mL | INTRAVENOUS | Status: DC | PRN
Start: 2020-11-21 — End: 2020-11-21

## 2020-11-21 MED ORDER — SODIUM CHLORIDE 0.9% FLUSH: 10.0000 mL | Freq: Two times a day (BID) | INTRAVENOUS | Status: DC

## 2020-11-21 NOTE — Progress Notes (Signed)
DISCHARGE NOTE HOME Lynx Goodrich to be discharged Home per MD order. Discussed prescriptions and follow up appointments with the patient. Prescriptions given to patient; medication list explained in detail. Patient verbalized understanding.  Skin clean, dry and intact without evidence of skin break down, no evidence of skin tears noted. IV catheter discontinued intact. Site without signs and symptoms of complications. Dressing and pressure applied. Pt denies pain at the site currently. No complaints noted.  Patient free of lines, drains, and wounds.   An After Visit Summary (AVS) was printed and given to the patient. Patient escorted via wheelchair, and discharged home via private auto.  Dolores Hoose, RN

## 2020-11-21 NOTE — Progress Notes (Signed)
PT Cancellation Note  Patient Details Name: Copelan Maultsby MRN: [id number] DOB: 06-02-1961   Cancelled Treatment:    Reason Eval/Treat Not Completed: Other (comment) currently in shower, politely asks if PT can return when he is done. Will attempt to return if time/schedule allow.    Windell Norfolk, DPT, PN1   Supplemental Physical Therapist [facility]    Pager [phone] Acute Rehab Office [phone]

## 2020-11-21 NOTE — Discharge Summary (Signed)
Tyler Brewer HGD:[id number] DOB: Aug 08, 1961 DOA: 11/04/2020  PCP: [name], DO  Admit date: 11/04/2020  Discharge date: 11/21/2020  Admitted From: Home   Disposition:  Home   Recommendations for Outpatient Follow-up:   Follow up with PCP in 1-2 weeks  PCP Please obtain BMP/CBC, 2 view CXR in 1week,  (see Discharge instructions)   PCP Please follow up on the following pending results:    Home Health: PT,RN,OT   Equipment/Devices: 3in1, Walker  Consultations: Neuro Discharge Condition: Stable    CODE STATUS: Full    Diet Recommendation: Heart Healthy Low Carb  Diet Order            Diet Carb Modified Fluid consistency: Thin; Room service appropriate? Yes  Diet effective now                  Chief Complaint  Patient presents with  . Dizziness     Brief history of present illness from the day of admission and additional interim summary    Patient is a 60 y.o. male with history of DM-2, CAD, OSA-presenting to the hospital with frequent falls-patient has had progressive bilateral lower extremity weakness since August 2021.  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  See below for further details  Significant events: 3/18>> admit for lower extremity weakness 3/25>> PLEX  started  Significant studies: 2/23>> MRI thoracic spine: No acute abnormalities noted. 3/19>> MRI lumbar sacral spine: No acute process identified. 3/19>> MRI C-spine: Small disc herniation at C3-C4, C4-C5-no abnormal cord signal. 3/19>> vitamin B12: 538 3/19>> HIV: Nonreactive 3/19>> TSH: Normal limits 3/19>> vitamin B6: NML 3/19>> HSV 1/HSV 2 DNA by PCR blood: Negative 3/19>> CMV DNA by PCR blood>> negative 3/20>> MRI T-spine with  contrast: Normal-appearing thoracic cord 3/20>> MRI C-spine: Normal-appearing cervical cord-no pathologic enhancement. 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  Case discussed with neurology on 11/20/2020, no further imaging patient management, he must follow-up with neurology in the office in 7 to 10 days post discharge.  He received PT - OT & qualified for placement however he now refuses to be placed and wants to be discharged home as he  has made arrangements for help at home, will be discharged home with home PT OT and DME equipment as needed.  Follow-up with neurology outpatient.  Right arm with previous IV site redness improved superficial thrombophlebitis.  Soft tissue & Venous ultrasound stable, solved with supportive care.  Left eye swelling likely chemosis - seen by ophthalmologist with Dr. Ellie Lunch, also placed on gentamicin ointment but with good benefit, much improved follow-up with PCP and ophthalmology outpatient.  Noncompliance with medical recommendations.  Patient counseled and warned of consequences, he understands and assumes all responsibility of any adverse outcome.PCP to follow closely.  Peripheral diabetic nephropathy: Continue Neurontin/Mobic and Cymbalta.  HTN: On combination of losartan, Norvasc and have added hydralazine, PCP to monitor.  HLD: Continue statin  BPH: Continue Flomax  Hyponatremia: SIADH improved  with Lasix.  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  Counseled to abstain.  Obesity: Body mass index is 40.1 kg/m - follow with PCP.  DM-2 (A1c 10.6 on 3/19): Poor outpatient control hyperglycemia, continue home regimen, check CBGs QA CHS and follow with PCP in a week for further monitoring and adjustment.  Lab Results  Component Value Date   HGBA1C 10.6 (H) 11/05/2020   CBG (last 3)  Recent Labs    11/20/20 1636 11/20/20 2139 11/21/20 0636  GLUCAP 159* 190* 142*    Discharge diagnosis     Principal Problem:   Bilateral leg weakness Active Problems:   DM type 2, uncontrolled, 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)   HTN (hypertension)    Discharge instructions    Discharge Instructions    Discharge instructions   Complete by: As directed    Follow with Primary MD Carollee Herter, Alferd Apa, DO in 7 days   Get CBC, CMP, 2 view Chest X ray -  checked next visit within 1 week by Primary MD    Activity: As tolerated with Full fall precautions use walker/cane & assistance as needed  Disposition Home    Diet: Heart Healthy Low Carb  Accuchecks 4 times/day, Once in AM empty stomach and then before each meal. Log in all results and show them to your Prim.MD in 3 days. If any glucose reading is under 80 or above 300 call your Prim MD immidiately. Follow Low glucose instructions for glucose under 80 as instructed.    Special Instructions: If you have smoked or chewed Tobacco  in the last 2 yrs please stop smoking, stop any regular Alcohol  and or any Recreational drug use.  On your next visit with your primary care physician please Get Medicines reviewed and adjusted.  Please request your Prim.MD to go over all Hospital Tests and Procedure/Radiological results at the follow up, please get all Hospital records sent to your Prim MD by signing hospital release before you go home.  If you experience worsening of your admission symptoms, develop shortness of breath, life  threatening emergency, suicidal or homicidal thoughts you must seek medical attention immediately by calling 911 or calling your MD immediately  if symptoms less severe.  You Must read complete instructions/literature along with all the possible adverse reactions/side effects for all the Medicines you take and that have been prescribed to you. Take any new Medicines after you have completely understood and accpet all the possible adverse reactions/side effects.   Increase activity slowly   Complete by: As directed    No wound care   Complete by: As directed       Discharge Medications   Allergies as of  11/21/2020   No Known Allergies     Medication List    STOP taking these medications   ibuprofen 200 MG tablet Commonly known as: ADVIL   naproxen sodium 220 MG tablet Commonly known as: ALEVE     TAKE these medications   ALLERGY RELIEF PO Take 1 tablet by mouth daily as needed (allergies).   amLODipine 10 MG tablet Commonly known as: NORVASC Take 1 tablet (10 mg total) by mouth daily.   diclofenac Sodium 1 % Gel Commonly known as: Voltaren Apply 2 g topically 4 (four) times daily. What changed:   when to take this  reasons to take this   DULoxetine 30 MG capsule Commonly known as: Cymbalta Take 1 capsule (30 mg total) by mouth daily. What changed: when to take this   gabapentin 300 MG capsule Commonly known as: Neurontin Take 2 tablets in the morning, 1 tablet in the afternoon, and 1 tablet at bedtime. What changed:   how much to take  how to take this  when to take this  additional instructions   hydrALAZINE 50 MG tablet Commonly known as: APRESOLINE Take 1 tablet (50 mg total) by mouth every 8 (eight) hours.   HYDROcodone-acetaminophen 5-325 MG tablet Commonly known as: NORCO/VICODIN Take 1 tablet by mouth every 4 (four) hours as needed. What changed: reasons to take this   insulin aspart 100 UNIT/ML injection Commonly known as: novoLOG Inject 10  Units into the skin 3 (three) times daily with meals.   losartan 100 MG tablet Commonly known as: COZAAR Take 1 tablet (100 mg total) by mouth daily.   meloxicam 7.5 MG tablet Commonly known as: Mobic Take 1 tablet (7.5 mg total) by mouth 2 (two) times daily as needed for pain.   methocarbamol 500 MG tablet Commonly known as: ROBAXIN Take 1 tablet (500 mg total) by mouth 4 (four) times daily. What changed:   when to take this  reasons to take this   pravastatin 40 MG tablet Commonly known as: PRAVACHOL Take 1 tablet (40 mg total) by mouth daily.   tamsulosin 0.4 MG Caps capsule Commonly known as: FLOMAX Take 1 capsule (0.4 mg total) by mouth daily.   tobramycin 0.3 % ophthalmic ointment Commonly known as: TOBREX Place into the left eye 3 (three) times daily.   traMADol 50 MG tablet Commonly known as: ULTRAM Take 1-2 tablets by mouth every 8 hours as needed for pain. What changed:   how much to take  how to take this  when to take this  reasons to take this  additional instructions   Tresiba FlexTouch 100 UNIT/ML FlexTouch Pen Generic drug: insulin degludec Inject 40 Units into the skin daily.            Durable Medical Equipment  (From admission, onward)         Start     Ordered   11/21/20 0739  For home use only DME Walker rolling  Once       Comments: 5 wheel  Question Answer Comment  Walker: With 5 Inch Wheels   Patient needs a walker to treat with the following condition Weakness      11/21/20 0738   11/21/20 0739  For home use only DME 3 n 1  Once        11/21/20 2297           Follow-up Information    [name], DO. Schedule an appointment as soon as possible for  a visit in 1 week(s).   Specialty: Family Medicine Contact information: [address] [place] [address] [phone]        [facility]. Schedule an appointment as soon as possible for a visit in 1 week(s).   Why:  diffuse leg weakness Contact information: [address]     [address] [place] [address] [phone]       Luberta Mutter, MD. Schedule an appointment as soon as possible for a visit in 1 week(s).   Specialty: Ophthalmology Why: eye infection Contact information: [address] [place] [address] [phone]               Major procedures and Radiology Reports - PLEASE review detailed and final reports thoroughly  -       DG Chest 2 View  Result Date: 11/01/2020 CLINICAL DATA:  Lower extremity edema. Fall this morning getting out of the tub. Shortness of breath. EXAM: CHEST - 2 VIEW COMPARISON:  Radiograph yesterday. FINDINGS: The cardiomediastinal contours are normal. The lungs are clear. Pulmonary vasculature is normal. No consolidation, pleural effusion, or pneumothorax. No acute osseous abnormalities are seen. Mild thoracic spondylosis. IMPRESSION: No acute chest findings. Electronically Signed   By: Keith Rake M.D.   On: 11/01/2020 17:40   DG Lumbar Spine Complete  Result Date: 11/01/2020 CLINICAL DATA:  Fall this morning getting out of the tub. EXAM: LUMBAR SPINE - COMPLETE 4+ VIEW COMPARISON:  Lumbar MRI 08/17/2020. radiograph 07/05/2020 FINDINGS: The alignment is maintained. Vertebral body heights are normal. No fracture. Endplate spurring at A0-T6, with additional spurring throughout. Mild L1-L2 and L2-L3 disc space narrowing. Lower lumbar facet hypertrophy. Sacroiliac joints are congruent. IMPRESSION: 1. No acute fracture or subluxation of the lumbar spine. 2. Stable degenerative change. Electronically Signed   By: Keith Rake M.D.   On: 11/01/2020 17:37   CT Head Wo Contrast  Result Date: 11/01/2020 CLINICAL DATA:  Neck trauma. [place] getting out of bathtub hitting head. EXAM: CT HEAD WITHOUT CONTRAST CT CERVICAL SPINE WITHOUT CONTRAST TECHNIQUE: Multidetector CT imaging of the head and cervical spine was performed following the  standard protocol without intravenous contrast. Multiplanar CT image reconstructions of the cervical spine were also generated. COMPARISON:  11/04/2018 FINDINGS: CT HEAD FINDINGS Brain: No evidence of acute infarction, hemorrhage, hydrocephalus, extra-axial collection or mass lesion/mass effect. There is mild diffuse low-attenuation within the subcortical and periventricular white matter compatible with chronic microvascular disease. Remote lacunar infarct within the right frontal lobe periventricular white matter, image 21/2. This is new when compared with 11/04/2019 Vascular: No hyperdense vessel or unexpected calcification. Skull: Normal. Negative for fracture or focal lesion. Sinuses/Orbits: No acute finding. Other: None CT CERVICAL SPINE FINDINGS Alignment: Normal alignment. Skull base and vertebrae: The inferior margin of the C7 vertebra is excluded from field of view. Imaged portions of the cervical vertebra are intact without signs of acute fracture or dislocation. Soft tissues and spinal canal: No prevertebral fluid or swelling. No visible canal hematoma. Disc levels: Mild disc space narrowing and endplate spurring noted at C4-5 and C6-7 Upper chest: Negative. Other: None IMPRESSION: 1. No acute intracranial abnormalities. 2. Chronic small vessel ischemic change and brain atrophy. 3. No evidence for cervical spine fracture. 4. Mild cervical degenerative disc disease. Electronically Signed   By: Kerby Moors M.D.   On: 11/01/2020 16:55   CT Cervical Spine Wo Contrast  Result Date: 11/01/2020 CLINICAL DATA:  Neck trauma. [place] getting out of  bathtub hitting head. EXAM: CT HEAD WITHOUT CONTRAST CT CERVICAL SPINE WITHOUT CONTRAST TECHNIQUE: Multidetector CT imaging of the head and cervical spine was performed following the standard protocol without intravenous contrast. Multiplanar CT image reconstructions of the cervical spine were also generated. COMPARISON:  11/04/2018 FINDINGS: CT HEAD FINDINGS Brain:  No evidence of acute infarction, hemorrhage, hydrocephalus, extra-axial collection or mass lesion/mass effect. There is mild diffuse low-attenuation within the subcortical and periventricular white matter compatible with chronic microvascular disease. Remote lacunar infarct within the right frontal lobe periventricular white matter, image 21/2. This is new when compared with 11/04/2019 Vascular: No hyperdense vessel or unexpected calcification. Skull: Normal. Negative for fracture or focal lesion. Sinuses/Orbits: No acute finding. Other: None CT CERVICAL SPINE FINDINGS Alignment: Normal alignment. Skull base and vertebrae: The inferior margin of the C7 vertebra is excluded from field of view. Imaged portions of the cervical vertebra are intact without signs of acute fracture or dislocation. Soft tissues and spinal canal: No prevertebral fluid or swelling. No visible canal hematoma. Disc levels: Mild disc space narrowing and endplate spurring noted at C4-5 and C6-7 Upper chest: Negative. Other: None IMPRESSION: 1. No acute intracranial abnormalities. 2. Chronic small vessel ischemic change and brain atrophy. 3. No evidence for cervical spine fracture. 4. Mild cervical degenerative disc disease. Electronically Signed   By: Kerby Moors M.D.   On: 11/01/2020 16:55   MR CERVICAL SPINE WO CONTRAST  Result Date: 11/05/2020 CLINICAL DATA:  60 year old male with progressive pain. Recurrent falls. Bilateral lower extremity weakness. Polysubstance abuse. Diabetes. EXAM: MRI CERVICAL SPINE WITHOUT CONTRAST TECHNIQUE: Multiplanar, multisequence MR imaging of the cervical spine was performed. No intravenous contrast was administered. COMPARISON:  CT cervical spine 11/01/2020. thoracic spine MRI 10/12/2020. FINDINGS: Alignment: Improved cervical lordosis from the recent CT. No significant spondylolisthesis. Vertebrae: No marrow edema or evidence of acute osseous abnormality. Visualized bone marrow signal is within normal  limits. Cord: Cervical spinal cord appears to remain normal despite some mild degenerative cervical spinal stenosis detailed below. Negative visible upper thoracic cord. Posterior Fossa, vertebral arteries, paraspinal tissues: Cervicomedullary junction is within normal limits. There is patchy T2 and STIR hyperintensity in the central pons which appears nonspecific on series 7, image 8. Otherwise negative visible posterior fossa. Preserved major vascular flow voids in the neck. Vertebral arteries appear codominant. Negative visible neck soft tissues. Disc levels: C2-C3: Minor disc bulging, endplate spurring and ligament flavum hypertrophy without stenosis. C3-C4: Small central disc protrusion with annular fissure (series 9, image 14). Otherwise mild leftward disc bulging and endplate spurring mostly affecting the foramen. Mild spinal stenosis. No significant cord mass effect. Mild to moderate left C4 foraminal stenosis. C4-C5: Circumferential disc bulge with broad-based posterior component effacing the ventral CSF space. Up to mild spinal stenosis. No cord mass effect. No significant foraminal stenosis. C5-C6:  Mild endplate spurring.  Mild left C6 foraminal stenosis. C6-C7: Left eccentric disc bulging, broad-based posterior component. Associated left foraminal disc and endplate spurring. No spinal stenosis. Moderate left C7 foraminal stenosis. C7-T1:  Negative. T1-T2 level appears stable, negative for age. IMPRESSION: 1. Small disc herniations at C3-C4 and C4-C5 result in up to mild spinal stenosis with no significant cord mass effect. No abnormal cord signal. 2. Up to moderate degenerative neural foraminal stenosis at the left C4 and left C7 nerve levels. Mild for age cervical spine degeneration otherwise. 3. Nonspecific T2 and STIR hyperintensity in the pons. Most commonly this would be due to chronic small vessel disease. Electronically Signed  By: Genevie Ann M.D.   On: 11/05/2020 07:34   MR CERVICAL SPINE W  CONTRAST  Result Date: 11/06/2020 CLINICAL DATA:  Recurrent falls due to progressive bilateral lower extremity weakness, most recently 11/01/2020 and 10/31/2020. EXAM: MRI CERVICAL SPINE WITH CONTRAST TECHNIQUE: Multiplanar, multisequence MR imaging of the cervical spine was performed following the administration of intravenous contrast. CONTRAST:  10 mL Gadavist IV. COMPARISON:  MRI cervical spine 11/05/2020. FINDINGS: No new abnormality is seen compared to the prior examination. There is no pathologic enhancement of the spinal cord or any other structure after contrast administration. IMPRESSION: Normal appearing cervical cord. Negative for pathologic enhancement. Electronically Signed   By: Inge Rise M.D.   On: 11/06/2020 16:39   MR THORACIC SPINE W WO CONTRAST  Result Date: 11/06/2020 CLINICAL DATA:  Progressive bilateral lower extremity weakness resulting in falls 11/01/2020 and 10/31/2020. EXAM: MRI THORACIC WITHOUT AND WITH CONTRAST TECHNIQUE: Multiplanar and multiecho pulse sequences of the thoracic spine were obtained without and with intravenous contrast. CONTRAST:  10 mL GADAVIST IV SOLN COMPARISON:  MRI thoracic spine 10/12/2020. FINDINGS: Alignment:  Normal. Vertebrae: No fracture, worrisome lesion or evidence of discitis. A few small Schmorl's nodes are again seen. Small hemangiomas in T9 and T10 are unchanged. Cord: Normal signal throughout. No pathologic enhancement after contrast administration. Paraspinal and other soft tissues: Negative. Disc levels: No change in mild thoracic spondylosis most notable at T4-5 where there is a very shallow right paracentral protrusion without stenosis. IMPRESSION: Normal appearing thoracic cord. No finding to explain the patient's symptoms. No change in small right paracentral protrusion at T4-5 without stenosis. Electronically Signed   By: Inge Rise M.D.   On: 11/06/2020 16:44   MR Lumbar Spine W Wo Contrast  Result Date:  11/05/2020 CLINICAL DATA:  60 year old male with progressive pain. Recurrent falls. Bilateral lower extremity weakness. Polysubstance abuse. Diabetes. EXAM: MRI LUMBAR SPINE WITHOUT AND WITH CONTRAST TECHNIQUE: Multiplanar and multiecho pulse sequences of the lumbar spine were obtained without and with intravenous contrast. CONTRAST:  64m GADAVIST GADOBUTROL 1 MMOL/ML IV SOLN COMPARISON:  Thoracic spine MRI 10/12/2020.  Lumbar MRI 08/17/2020. FINDINGS: Segmentation: Lumbar segmentation appears to be normal and this appears concordant with the thoracic spine numbering last month, also the prior lumbar numbering. Alignment: Stable lordosis since December. Subtle degenerative retrolisthesis of L2 on L3. Vertebrae: Stable since December round vertebral body hemangioma located centrally and L3, benign. Normal background bone marrow signal. No marrow edema or acute osseous abnormality. Intact visible sacrum and SI joints. Conus medullaris and cauda equina: Conus extends to the T12-L1 level. No lower spinal cord or conus signal abnormality. Cauda equina nerve roots appear normal. No abnormal intradural enhancement or dural thickening. Paraspinal and other soft tissues: Stable and negative visible abdominal viscera. There is subtle lumbar rect or spine I muscle edema and enhancement on the right at L2-L3 (series 11, image 5). There is more moderate sacral erector spinae muscle edema and enhancement on the left (series 11, image 11. These are new since December. No intramuscular fluid collection. Other paraspinal soft tissues appear within normal limits. Disc levels: Negative visible lower thoracic levels. L1-L2: Stable minor disc bulging and endplate spurring without stenosis. L2-L3: Mild circumferential disc bulge. Small right paracentral disc protrusion and annular fissure appear mildly increased since December. But there is no significant spinal stenosis, mild if any associated right lateral recess stenosis (right L3  nerve level). L3-L4:  Stable mild disc bulge without stenosis. L4-L5:  Negative. L5-S1: Moderate  left facet hypertrophy and degenerative facet joint fluid appear stable since December. No associated stenosis. IMPRESSION: 1. Mild nonspecific inflammation in the right erector spinae muscle at L2-L3, and on the left at S1-S2. These might be posttraumatic in this setting. 2. No other acute or inflammatory process identified in the lumbar spine. 3. Mild lumbar spine degeneration without spinal stenosis. Rightward L2-L3 disc protrusion with annular fissure does appear mildly increased since December. Query right L3 radiculitis. Moderately advanced chronic facet degeneration on the left at L5-S1. Electronically Signed   By: Genevie Ann M.D.   On: 11/05/2020 07:41   US Venous Img Lower Unilateral Left  Result Date: 11/01/2020 CLINICAL DATA:  Lower extremity edema, LEFT leg pain, fell this morning EXAM: LEFT LOWER EXTREMITY VENOUS DOPPLER ULTRASOUND TECHNIQUE: Gray-scale sonography with compression, as well as color and duplex ultrasound, were performed to evaluate the deep venous system(s) from the level of the common femoral vein through the popliteal and proximal calf veins. COMPARISON:  06/20/2020 FINDINGS: VENOUS Normal compressibility of the common femoral, superficial femoral, and popliteal veins, as well as the visualized calf veins. Visualized portions of profunda femoral vein and great saphenous vein unremarkable. No filling defects to suggest DVT on grayscale or color Doppler imaging. Doppler waveforms show normal direction of venous flow, normal respiratory plasticity and response to augmentation. Limited views of the contralateral common femoral vein are unremarkable. OTHER None. Limitations: none IMPRESSION: No evidence of deep venous thrombosis in the LEFT lower extremity. Electronically Signed   By: Lavonia Dana M.D.   On: 11/01/2020 16:26   IR Fluoro Guide CV Line Right  Result Date:  11/10/2020 INDICATION: 60 year old male referred for temporary hemodialysis/plasmapheresis catheter placement EXAM: IMAGE GUIDED PLACEMENT OF TEMPORARY PLASMAPHERESIS CATHETER/HD CATHETER MEDICATIONS: None ANESTHESIA/SEDATION: None. FLUOROSCOPY TIME:  Fluoroscopy Time: 0 minutes 12 seconds (10 mGy). COMPLICATIONS: None PROCEDURE: Informed written consent was obtained from the patient's family after a discussion of the risks, benefits, and alternatives to treatment. Questions regarding the procedure were encouraged and answered. The right neck was prepped with chlorhexidine in a sterile fashion, and a sterile drape was applied covering the operative field. Maximum barrier sterile technique with sterile gowns and gloves were used for the procedure. A timeout was performed prior to the initiation of the procedure. A micropuncture kit was utilized to access the right internal jugular vein under direct, real-time ultrasound guidance after the overlying soft tissues were anesthetized with 1% lidocaine with epinephrine. Ultrasound image documentation was performed. The microwire was kinked to measure appropriate catheter length. A stiff glidewire was advanced to the level of the IVC. A 20 cm hemodialysis catheter was then placed over the wire. Final catheter positioning was confirmed and documented with a spot radiographic image. The catheter aspirates and flushes normally. The catheter was flushed with appropriate volume heparin dwells. Dressings were applied. The patient tolerated the procedure well without immediate post procedural complication. IMPRESSION: Status post right IJ temporary hemodialysis catheter. Signed, Dulcy Fanny. Dellia Nims, RPVI [facility] Radiology Electronically Signed   By: Corrie Mckusick D.O.   On: 11/10/2020 16:52   IR US Guide Vasc Access Right  Result Date: 11/10/2020 INDICATION: 60 year old male referred for temporary hemodialysis/plasmapheresis  catheter placement EXAM: IMAGE GUIDED PLACEMENT OF TEMPORARY PLASMAPHERESIS CATHETER/HD CATHETER MEDICATIONS: None ANESTHESIA/SEDATION: None. FLUOROSCOPY TIME:  Fluoroscopy Time: 0 minutes 12 seconds (10 mGy). COMPLICATIONS: None PROCEDURE: Informed written consent was obtained from the patient's family after a discussion of the risks, benefits, and alternatives  to treatment. Questions regarding the procedure were encouraged and answered. The right neck was prepped with chlorhexidine in a sterile fashion, and a sterile drape was applied covering the operative field. Maximum barrier sterile technique with sterile gowns and gloves were used for the procedure. A timeout was performed prior to the initiation of the procedure. A micropuncture kit was utilized to access the right internal jugular vein under direct, real-time ultrasound guidance after the overlying soft tissues were anesthetized with 1% lidocaine with epinephrine. Ultrasound image documentation was performed. The microwire was kinked to measure appropriate catheter length. A stiff glidewire was advanced to the level of the IVC. A 20 cm hemodialysis catheter was then placed over the wire. Final catheter positioning was confirmed and documented with a spot radiographic image. The catheter aspirates and flushes normally. The catheter was flushed with appropriate volume heparin dwells. Dressings were applied. The patient tolerated the procedure well without immediate post procedural complication. IMPRESSION: Status post right IJ temporary hemodialysis catheter. Signed, Dulcy Fanny. Dellia Nims, RPVI V[facility] Inter[facility] Radiology Specialists [facility] Radiology Electronically Signed   By: Corrie Mckusick D.O.   On: 11/10/2020 16:52   DG Chest Portable 1 View  Result Date: 10/31/2020 CLINICAL DATA:  Syncope. EXAM: PORTABLE CHEST 1 VIEW COMPARISON:  July 08, 2020. FINDINGS: The heart size and mediastinal contours are within normal limits. Both lungs are  clear. No visible pleural effusions or pneumothorax. No acute osseous abnormality. IMPRESSION: Low lung volumes without evidence of acute cardiopulmonary disease. Electronically Signed   By: Margaretha Sheffield MD   On: 10/31/2020 18:33   DG Knee Complete 4 Views Right  Result Date: 11/01/2020 CLINICAL DATA:  Fall this morning getting out of the tub. Right knee pain. EXAM: RIGHT KNEE - COMPLETE 4+ VIEW COMPARISON:  None. FINDINGS: No evidence of fracture, dislocation, or joint effusion. Normal alignment and joint spaces. Soft tissues are unremarkable. IMPRESSION: Negative radiographs of the right knee. Electronically Signed   By: Keith Rake M.D.   On: 11/01/2020 17:35   [place] RT UPPER EXTREM LTD SOFT TISSUE NON VASCULAR  Result Date: 11/14/2020 CLINICAL DATA:  Right forearm swelling and redness in the area of recent IV access. EXAM: ULTRASOUND RIGHT UPPER EXTREMITY LIMITED TECHNIQUE: Ultrasound examination of the upper extremity soft tissues was performed in the area of clinical concern. COMPARISON:  None. FINDINGS: Focused ultrasound of the right forearm demonstrates prominent soft tissue swelling with hyperemia. Superficial tubular structure without internal flow deep to the subcutaneous edema concerning for superficial venous thrombosis. IMPRESSION: 1. Findings concerning for superficial venous thrombosis in the right forearm. Recommend dedicated right upper extremity duplex venous ultrasound. Electronically Signed   By: Titus Dubin M.D.   On: 11/14/2020 16:20   DG HIPS BILAT WITH PELVIS MIN 5 VIEWS  Result Date: 11/01/2020 CLINICAL DATA:  Fall this morning getting out of the tub. Bilateral hip pain. EXAM: DG HIP (WITH OR WITHOUT PELVIS) 5+V BILAT COMPARISON:  Left hip radiograph 08/01/2020, hip MRI 08/17/2020 FINDINGS: The cortical margins of the bony pelvis are intact. No fracture. Pubic symphysis and sacroiliac joints are congruent. Both femoral heads are well-seated in the respective  acetabula. Mild bilateral acetabular spurring with preservation of joint spaces. IMPRESSION: No fracture of the pelvis or hips. Electronically Signed   By: Keith Rake M.D.   On: 11/01/2020 17:38   VAS [place] UPPER EXTREMITY VENOUS DUPLEX  Result Date: 11/15/2020 UPPER VENOUS STUDY  Indications: Pain, erythema, and swelling at previous IV site. Comparison Study:  11-14-2020 Ultrasound soft tissue of RT upper extremity was                   concerning for SVT of forearm. Performing Technologist: Darlin Coco RDMS,RVT  Examination Guidelines: A complete evaluation includes B-mode imaging, spectral Doppler, color Doppler, and power Doppler as needed of all accessible portions of each vessel. Bilateral testing is considered an integral part of a complete examination. Limited examinations for reoccurring indications may be performed as noted.  Right Findings: +----------+------------+---------+-----------+----------+---------------------+ RIGHT     CompressiblePhasicitySpontaneousProperties       Summary        +----------+------------+---------+-----------+----------+---------------------+ IJV                                                 Unable to assess due                                                          to bandaging      +----------+------------+---------+-----------+----------+---------------------+ Subclavian    Full       Yes       Yes                                    +----------+------------+---------+-----------+----------+---------------------+ Axillary      Full       Yes       Yes                                    +----------+------------+---------+-----------+----------+---------------------+ Brachial      Full                                                        +----------+------------+---------+-----------+----------+---------------------+ Radial        Full                                                         +----------+------------+---------+-----------+----------+---------------------+ Ulnar         Full                                                        +----------+------------+---------+-----------+----------+---------------------+ Cephalic      None       No        No                       Acute         +----------+------------+---------+-----------+----------+---------------------+ Basilic       Full                                                        +----------+------------+---------+-----------+----------+---------------------+  Left Findings: +----------+------------+---------+-----------+----------+-------+ LEFT      CompressiblePhasicitySpontaneousPropertiesSummary +----------+------------+---------+-----------+----------+-------+ Subclavian    Full       Yes       Yes                      +----------+------------+---------+-----------+----------+-------+  Summary:  Right: No evidence of deep vein thrombosis in the upper extremity. Findings consistent with acute superficial vein thrombosis involving the right cephalic vein.  Left: No evidence of thrombosis in the subclavian.  *See table(s) above for measurements and observations.  Diagnosing physician: Curt Jews MD Electronically signed by Curt Jews MD on 11/15/2020 at 72:44:12 PM.    Final    DG FL GUIDED LUMBAR PUNCTURE  Result Date: 11/09/2020 CLINICAL DATA:  Weakness. EXAM: DIAGNOSTIC LUMBAR PUNCTURE UNDER FLUOROSCOPIC GUIDANCE COMPARISON:  MRI of the lumbar spine 11/05/2020. FLUOROSCOPY TIME:  Fluoroscopy Time:  12 seconds Radiation Exposure Index (if provided by the fluoroscopic device): 2.0 mGy Number of Acquired Spot Images: None PROCEDURE: Informed consent was obtained from the patient prior to the procedure, with a discussion of potential complications including but not limited to, headache, allergy, damage to the spinal cord/nerve roots, bleeding and pain. With the patient prone, the lower back was  prepped with Betadine. 1% Lidocaine was used for local anesthesia. Lumbar puncture was performed at the L4-L5 level using a 20 gauge needle with return of clear CSF. 13 ml of CSF were obtained for laboratory studies. The patient tolerated the procedure well without immediate postprocedure complication. IMPRESSION: Successful L4-L5 fluoroscopically-guided lumbar puncture. 13 mL of CSF were obtained and sent for laboratory studies. Electronically Signed   By: Kellie Simmering DO   On: 11/09/2020 09:55    Micro Results     No results found for this or any previous visit (from the past 240 hour(s)).  Today   Subjective    Tyler Brewer today has no headache,no chest abdominal pain,no new weakness tingling or numbness, feels much better wants to go home today.     Objective   Blood pressure (!) 153/89, pulse 91, temperature 98.4 F (36.9 C), temperature source Oral, resp. rate 19, height '5\' 11"'  (1.803 m), weight 130.4 kg, SpO2 98 %.   Intake/Output Summary (Last 24 hours) at 11/21/2020 0853 Last data filed at 11/21/2020 0700 Gross per 24 hour  Intake 1000 ml  Output 0 ml  Net 1000 ml    Exam  Awake Alert, No new F.N deficits, continues to have bilateral lower extremity weakness L>>R, left eye swelling has improved, normal affect [place].AT,PERRAL Supple Neck,No JVD, No cervical lymphadenopathy appriciated.  Symmetrical Chest wall movement, Good air movement bilaterally, CTAB RRR,No Gallops,Rubs or new Murmurs, No Parasternal Heave +ve B.Sounds, Abd Soft, Non tender, No organomegaly appriciated, No rebound -guarding or rigidity. No Cyanosis, Clubbing or edema, No new Rash or bruise   Data Review   CBC w Diff:  Lab Results  Component Value Date   WBC 7.3 11/20/2020   HGB 11.4 (L) 11/20/2020   HCT 34.6 (L) 11/20/2020   PLT 246 11/20/2020   LYMPHOPCT 28 11/20/2020   MONOPCT 11 11/20/2020   EOSPCT 4 11/20/2020   BASOPCT 1 11/20/2020    CMP:  Lab Results  Component Value Date   NA  134 (L) 11/20/2020   K 4.0 11/20/2020   CL 105 11/20/2020   CO2 23 11/20/2020   BUN 22 (H) 11/20/2020   CREATININE 1.11 11/20/2020   CREATININE 0.90 09/28/2016   PROT 5.0 (  L) 11/20/2020   ALBUMIN 3.7 11/20/2020   ALBUMIN 3.3 (L) 11/09/2020   BILITOT 0.3 11/20/2020   ALKPHOS 35 (L) 11/20/2020   AST 17 11/20/2020   ALT 19 11/20/2020  .   Total Time in preparing paper work, data evaluation and todays exam - 77 minutes  Lala Lund M.D on 11/21/2020 at 8:53 AM  [facility]

## 2020-11-21 NOTE — Progress Notes (Signed)
PT Cancellation Note  Patient Details Name: Tyler Brewer MRN: [id number] DOB: Dec 03, 1960   Cancelled Treatment:    Reason Eval/Treat Not Completed: Other (comment) patient not in room, RN reports he has discharged. Thank you for the opportunity to participate in his care!   Windell Norfolk, DPT, PN1   Supplemental Physical Therapist [facility]    Pager [phone] Acute Rehab Office [phone]

## 2020-11-21 NOTE — Plan of Care (Signed)

## 2020-11-21 NOTE — TOC Transition Note (Signed)
Transition of Care [facility]) - CM/SW Discharge Note   Patient Details  Name: Tyler Brewer MRN: [id number] Date of Birth: [birth year]  [facility]) CM/SW Contact:  Bartholomew Crews, RN Phone Number: [phone] 11/21/2020, 12:51 PM   Clinical Narrative:     Spoke with patient at the bedside to discuss transition home rather than SNF. Patient stated that he has made arrangements with friends so that he is not alone.   Discussed HH - patient agreeable. Referral accepted by [facility] for RN, PT, OT. Anticipate start of care the end of week with nursing visit, then followed by PT visit.   Patient requesting assistance with transportation home. Transportation arranged through [employer] for [place] transport. [place] waiver signed and faxed to transportation.   No further TOC needs identified.   Final next level of care: [place] Barriers to Discharge: No Barriers Identified   Patient Goals and CMS Choice Patient states their goals for this hospitalization and ongoing recovery are:: return home with support of friends CMS Medicare.gov Compare Post Acute Care list provided to:: Patient Choice offered to / list presented to : Patient  Discharge Placement                       Discharge Plan and Services In-house Referral: Clinical Social Work              DME Arranged: N/A DME Agency: NA       HH Arranged: RN,PT,OT [place] Agency: [facility] [employer] [facility] [employer] (now known as [employer]) Date [place]: 11/21/20 Time [place]: 1505 Representative spoke with at [place]: [place]  Social Determinants of Health ([place]) Interventions     Readmission Risk Interventions No flowsheet data found.

## 2020-11-21 NOTE — Discharge Instructions (Signed)
Follow with Primary MD Carollee Herter, Alferd Apa, DO in 7 days   Get CBC, CMP, 2 view Chest X ray -  checked next visit within 1 week by Primary MD    Activity: As tolerated with Full fall precautions use walker/cane & assistance as needed  Disposition Home    Diet: Heart Healthy Low Carb  Accuchecks 4 times/day, Once in AM empty stomach and then before each meal. Log in all results and show them to your Prim.MD in 3 days. If any glucose reading is under 80 or above 300 call your Prim MD immidiately. Follow Low glucose instructions for glucose under 80 as instructed.    Special Instructions: If you have smoked or chewed Tobacco  in the last 2 yrs please stop smoking, stop any regular Alcohol  and or any Recreational drug use.  On your next visit with your primary care physician please Get Medicines reviewed and adjusted.  Please request your Prim.MD to go over all Hospital Tests and Procedure/Radiological results at the follow up, please get all Hospital records sent to your Prim MD by signing hospital release before you go home.  If you experience worsening of your admission symptoms, develop shortness of breath, life threatening emergency, suicidal or homicidal thoughts you must seek medical attention immediately by calling 911 or calling your MD immediately  if symptoms less severe.  You Must read complete instructions/literature along with all the possible adverse reactions/side effects for all the Medicines you take and that have been prescribed to you. Take any new Medicines after you have completely understood and accpet all the possible adverse reactions/side effects.

## 2020-11-21 NOTE — Plan of Care (Signed)
  Problem: Activity: Goal: Risk for activity intolerance will decrease Outcome: Progressing   Problem: Coping: Goal: Level of anxiety will decrease Outcome: Progressing   

## 2020-11-22 ENCOUNTER — Other Ambulatory Visit: Payer: Self-pay

## 2020-11-22 DIAGNOSIS — I2510 Atherosclerotic heart disease of native coronary artery without angina pectoris: Secondary | ICD-10-CM

## 2020-11-22 DIAGNOSIS — IMO0002 Reserved for concepts with insufficient information to code with codable children: Secondary | ICD-10-CM

## 2020-11-22 DIAGNOSIS — E1140 Type 2 diabetes mellitus with diabetic neuropathy, unspecified: Secondary | ICD-10-CM

## 2020-11-22 NOTE — Patient Outreach (Signed)
Tyler Brewer [facility]) Care Management  11/22/2020  Tyler Brewer [birth year] [id number]  Hospital TOC RNCM referral, Tyler Brewer - Late entry: 11/21/2020 12:45 pm  [place] [facility] [ACO] Patient: Tyler Brewer Medicare  Patient evaluated for community based chronic disease management services with [place] [facility] as a benefit of patient's [employer]. Spoke with patient at bedside to explain [place] Management services. Patient is in the [place] with Dr. Roma Brewer confirmed.    Plan:  Patient will receive post hospital discharge TOC call and for assessments from primary care provider and  needs for chronic care/disease management program.      Inpatient Transition of Care [TOC] Case Manager made aware that [facility] requested to follow.  Of note, [facility] Care Management services does not replace or interfere with any services that are arranged by inpatient Transition of Care [TOC] team     For additional questions or referrals please contact:    Tyler Brood, RN BSN [place] [facility] Liaison  [phone] business mobile phone Toll free office [phone]  Fax number: [phone] [email or web address]

## 2020-11-23 ENCOUNTER — Telehealth: Payer: Self-pay

## 2020-11-23 ENCOUNTER — Telehealth: Payer: Self-pay | Admitting: *Deleted

## 2020-11-23 ENCOUNTER — Other Ambulatory Visit: Payer: Self-pay | Admitting: Family Medicine

## 2020-11-23 MED ORDER — FUROSEMIDE 20 MG PO TABS: 20.0000 mg | ORAL_TABLET | Freq: Every day | ORAL | 0 refills | Status: DC

## 2020-11-23 NOTE — Telephone Encounter (Signed)
Ok to send in lasix 20 mg 1 po qd #30

## 2020-11-23 NOTE — Telephone Encounter (Signed)
Patient is requesting a prescription for Lasix. He says his feet are swelling. He states they were giving him Lasix in the hospital but did not give him a prescription when he was discharged.

## 2020-11-23 NOTE — Chronic Care Management (AMB) (Signed)
  Chronic Care Management   Note  11/23/2020 Name: Tyler Brewer MRN: [id number] DOB: 1961/02/16  Tyler Brewer is a 60 y.o. year old male who is a primary care patient of [name], DO. I reached out to Thomasenia Bottoms by phone today in response to a referral sent by [name] PCP, Carollee Herter, Alferd Apa, DO.     [name] was given information about Chronic Care Management services today including:  1. CCM service includes personalized support from designated clinical staff supervised by his physician, including individualized plan of care and coordination with other care providers 2. 24/7 contact phone numbers for assistance for urgent and routine care needs. 3. Service will only be billed when office clinical staff spend 20 minutes or more in a month to coordinate care. 4. Only one practitioner may furnish and bill the service in a calendar month. 5. The patient may stop CCM services at any time (effective at the end of the month) by phone call to the office staff. 6. The patient will be responsible for cost sharing (co-pay) of up to 20% of the service fee (after annual deductible is met).  Patient agreed to services and verbal consent obtained.   Follow up plan: Telephone appointment with care management team member scheduled for:12/01/2020  [place] Management

## 2020-11-23 NOTE — Addendum Note (Signed)
Addended by: Sanda Linger on: 11/23/2020 02:30 PM   Modules accepted: Orders

## 2020-11-23 NOTE — Telephone Encounter (Signed)
Transition Care Management Follow-up Telephone Call  Date of discharge and from where: 11/21/4020[place]  How have you been since you were released from the hospital? Doing ok. Balance is still off & my feet are swelling.  Any questions or concerns? No  Items Reviewed:  Did the pt receive and understand the discharge instructions provided? Yes   Medications obtained and verified? Yes   Other? Yes   Any new allergies since your discharge? No   Dietary orders reviewed? Yes  Do you have support at home? Yes   Home Care and Equipment/Supplies: Were home health services ordered? Yes  If so, what is the name of the agency? [facility] Has the agency set up a time to come to the patient's home? no Were any new equipment or medical supplies ordered?  Yes: walker What is the name of the medical supply agency? No-pt already had walker at home Were you able to get the supplies/equipment? yes Do you have any questions related to the use of the equipment or supplies? No  Functional Questionnaire: (I = Independent and D = Dependent) ADLs: I WITH ASSISTANCE  Bathing/Dressing- I  Meal Prep- D  Eating- I  Maintaining continence- I  Transferring/Ambulation- I WITH WALKER  Managing Meds- I  Follow up appointments reviewed:   PCP Hospital f/u appt confirmed? Yes  Scheduled to see Dr. Etter Sjogren on 11/28/2020 @ 2:20.  [place] [facility] f/u appt confirmed? No  Patient states he will call to schedule  Are transportation arrangements needed? No   If their condition worsens, is the pt aware to call PCP or go to the Emergency Dept.? Yes  Was the patient provided with contact information for the PCP's office or ED? Yes  Was to pt encouraged to call back with questions or concerns? Yes

## 2020-11-23 NOTE — Telephone Encounter (Signed)
Pt notified of medication sent

## 2020-11-27 DIAGNOSIS — R159 Full incontinence of feces: Secondary | ICD-10-CM | POA: Diagnosis not present

## 2020-11-27 DIAGNOSIS — E1140 Type 2 diabetes mellitus with diabetic neuropathy, unspecified: Secondary | ICD-10-CM | POA: Diagnosis not present

## 2020-11-27 DIAGNOSIS — G6181 Chronic inflammatory demyelinating polyneuritis: Secondary | ICD-10-CM | POA: Diagnosis not present

## 2020-11-27 DIAGNOSIS — G8929 Other chronic pain: Secondary | ICD-10-CM | POA: Diagnosis not present

## 2020-11-27 DIAGNOSIS — Z6841 Body Mass Index (BMI) 40.0 and over, adult: Secondary | ICD-10-CM | POA: Diagnosis not present

## 2020-11-27 DIAGNOSIS — Z9181 History of falling: Secondary | ICD-10-CM | POA: Diagnosis not present

## 2020-11-27 DIAGNOSIS — Z87891 Personal history of nicotine dependence: Secondary | ICD-10-CM | POA: Diagnosis not present

## 2020-11-27 DIAGNOSIS — F988 Other specified behavioral and emotional disorders with onset usually occurring in childhood and adolescence: Secondary | ICD-10-CM | POA: Diagnosis not present

## 2020-11-27 DIAGNOSIS — M5441 Lumbago with sciatica, right side: Secondary | ICD-10-CM | POA: Diagnosis not present

## 2020-11-27 DIAGNOSIS — Z7984 Long term (current) use of oral hypoglycemic drugs: Secondary | ICD-10-CM | POA: Diagnosis not present

## 2020-11-27 DIAGNOSIS — I2510 Atherosclerotic heart disease of native coronary artery without angina pectoris: Secondary | ICD-10-CM | POA: Diagnosis not present

## 2020-11-27 DIAGNOSIS — Z794 Long term (current) use of insulin: Secondary | ICD-10-CM | POA: Diagnosis not present

## 2020-11-27 DIAGNOSIS — N401 Enlarged prostate with lower urinary tract symptoms: Secondary | ICD-10-CM | POA: Diagnosis not present

## 2020-11-27 DIAGNOSIS — M1990 Unspecified osteoarthritis, unspecified site: Secondary | ICD-10-CM | POA: Diagnosis not present

## 2020-11-27 DIAGNOSIS — K589 Irritable bowel syndrome without diarrhea: Secondary | ICD-10-CM | POA: Diagnosis not present

## 2020-11-27 DIAGNOSIS — N39498 Other specified urinary incontinence: Secondary | ICD-10-CM | POA: Diagnosis not present

## 2020-11-27 DIAGNOSIS — J849 Interstitial pulmonary disease, unspecified: Secondary | ICD-10-CM | POA: Diagnosis not present

## 2020-11-27 DIAGNOSIS — G4733 Obstructive sleep apnea (adult) (pediatric): Secondary | ICD-10-CM | POA: Diagnosis not present

## 2020-11-27 DIAGNOSIS — E1142 Type 2 diabetes mellitus with diabetic polyneuropathy: Secondary | ICD-10-CM | POA: Diagnosis not present

## 2020-11-27 DIAGNOSIS — E1121 Type 2 diabetes mellitus with diabetic nephropathy: Secondary | ICD-10-CM | POA: Diagnosis not present

## 2020-11-27 DIAGNOSIS — E7800 Pure hypercholesterolemia, unspecified: Secondary | ICD-10-CM | POA: Diagnosis not present

## 2020-11-27 DIAGNOSIS — F32A Depression, unspecified: Secondary | ICD-10-CM | POA: Diagnosis not present

## 2020-11-27 DIAGNOSIS — E1136 Type 2 diabetes mellitus with diabetic cataract: Secondary | ICD-10-CM | POA: Diagnosis not present

## 2020-11-27 DIAGNOSIS — I10 Essential (primary) hypertension: Secondary | ICD-10-CM | POA: Diagnosis not present

## 2020-11-27 DIAGNOSIS — F410 Panic disorder [episodic paroxysmal anxiety] without agoraphobia: Secondary | ICD-10-CM | POA: Diagnosis not present

## 2020-11-27 DIAGNOSIS — R69 Illness, unspecified: Secondary | ICD-10-CM | POA: Diagnosis not present

## 2020-11-28 ENCOUNTER — Telehealth: Payer: Self-pay | Admitting: Family Medicine

## 2020-11-28 ENCOUNTER — Ambulatory Visit (HOSPITAL_BASED_OUTPATIENT_CLINIC_OR_DEPARTMENT_OTHER)
Admission: RE | Admit: 2020-11-28 | Discharge: 2020-11-28 | Disposition: A | Discharge status: Home / Self Care | Payer: Medicare HMO | Source: Ambulatory Visit | Attending: Family Medicine | Admitting: Family Medicine

## 2020-11-28 ENCOUNTER — Other Ambulatory Visit: Payer: Self-pay

## 2020-11-28 ENCOUNTER — Encounter: Payer: Self-pay | Admitting: Family Medicine

## 2020-11-28 ENCOUNTER — Telehealth: Payer: Self-pay

## 2020-11-28 ENCOUNTER — Telehealth: Payer: Self-pay | Admitting: *Deleted

## 2020-11-28 ENCOUNTER — Ambulatory Visit (INDEPENDENT_AMBULATORY_CARE_PROVIDER_SITE_OTHER): Payer: Medicare HMO | Admitting: Family Medicine

## 2020-11-28 VITALS — BP 142/82 | HR 94 | Temp 98.3°F | Resp 18 | Ht 71.0 in | Wt 282.4 lb

## 2020-11-28 DIAGNOSIS — E785 Hyperlipidemia, unspecified: Secondary | ICD-10-CM

## 2020-11-28 DIAGNOSIS — R600 Localized edema: Secondary | ICD-10-CM | POA: Insufficient documentation

## 2020-11-28 DIAGNOSIS — G8929 Other chronic pain: Secondary | ICD-10-CM

## 2020-11-28 DIAGNOSIS — I10 Essential (primary) hypertension: Secondary | ICD-10-CM

## 2020-11-28 DIAGNOSIS — I712 Thoracic aortic aneurysm, without rupture, unspecified: Secondary | ICD-10-CM

## 2020-11-28 DIAGNOSIS — R0602 Shortness of breath: Secondary | ICD-10-CM | POA: Diagnosis not present

## 2020-11-28 DIAGNOSIS — E1165 Type 2 diabetes mellitus with hyperglycemia: Secondary | ICD-10-CM

## 2020-11-28 DIAGNOSIS — R69 Illness, unspecified: Secondary | ICD-10-CM | POA: Diagnosis not present

## 2020-11-28 DIAGNOSIS — IMO0002 Reserved for concepts with insufficient information to code with codable children: Secondary | ICD-10-CM

## 2020-11-28 DIAGNOSIS — M545 Low back pain, unspecified: Secondary | ICD-10-CM | POA: Diagnosis not present

## 2020-11-28 DIAGNOSIS — R531 Weakness: Secondary | ICD-10-CM

## 2020-11-28 DIAGNOSIS — J849 Interstitial pulmonary disease, unspecified: Secondary | ICD-10-CM

## 2020-11-28 DIAGNOSIS — F1920 Other psychoactive substance dependence, uncomplicated: Secondary | ICD-10-CM

## 2020-11-28 DIAGNOSIS — L4050 Arthropathic psoriasis, unspecified: Secondary | ICD-10-CM | POA: Diagnosis not present

## 2020-11-28 DIAGNOSIS — E1140 Type 2 diabetes mellitus with diabetic neuropathy, unspecified: Secondary | ICD-10-CM

## 2020-11-28 DIAGNOSIS — C9000 Multiple myeloma not having achieved remission: Secondary | ICD-10-CM | POA: Diagnosis not present

## 2020-11-28 DIAGNOSIS — E0842 Diabetes mellitus due to underlying condition with diabetic polyneuropathy: Secondary | ICD-10-CM

## 2020-11-28 DIAGNOSIS — F1520 Other stimulant dependence, uncomplicated: Secondary | ICD-10-CM

## 2020-11-28 DIAGNOSIS — R29898 Other symptoms and signs involving the musculoskeletal system: Secondary | ICD-10-CM

## 2020-11-28 IMAGING — CR DG CHEST 2V
2 series · 2 of 2 positions shown · non-contrast
Comparison: Chest x-ray dated [DATE].

CLINICAL DATA: Shortness of breath and lower extremity swelling.

EXAM:
CHEST - 2 VIEW

[w chest pa]
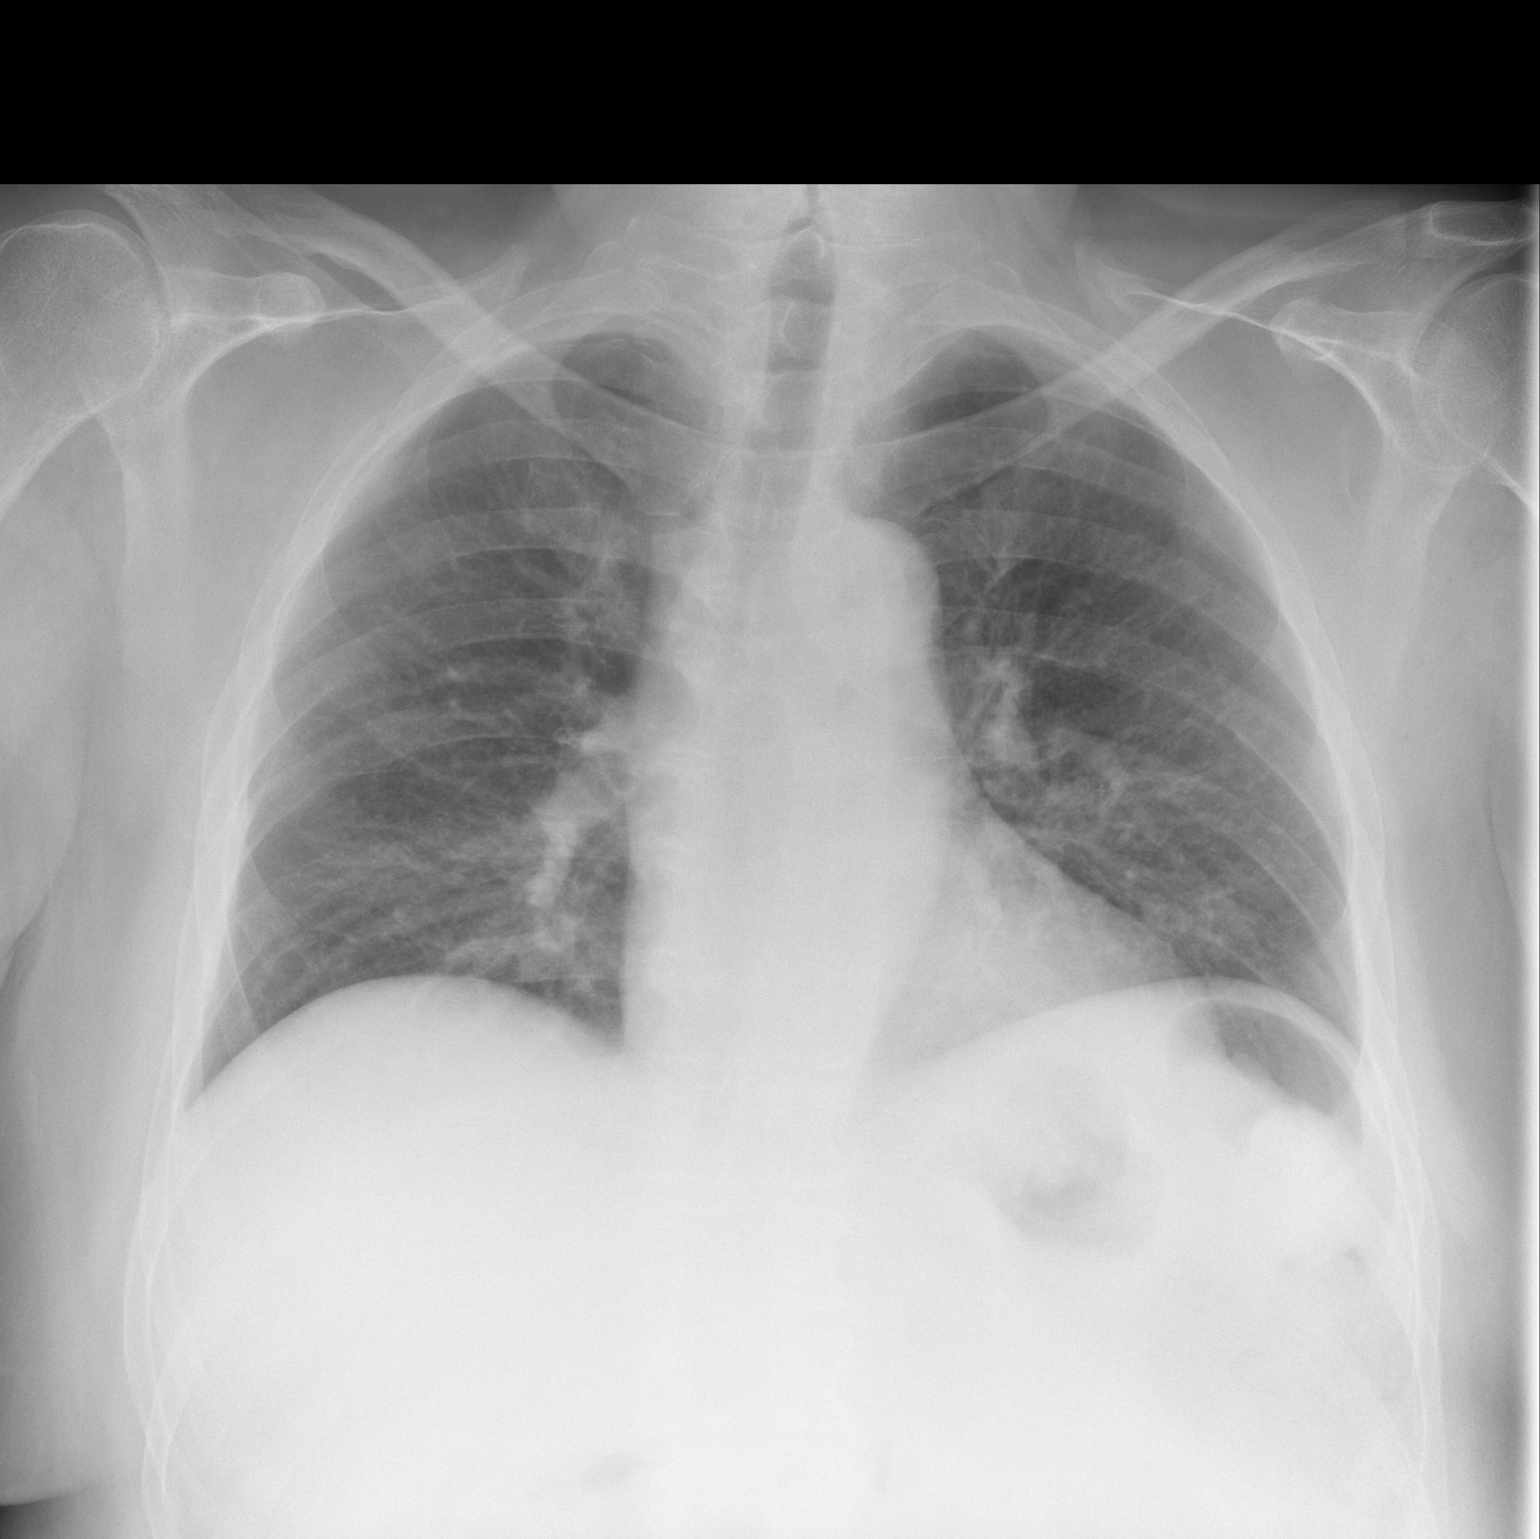

[w chest lat]
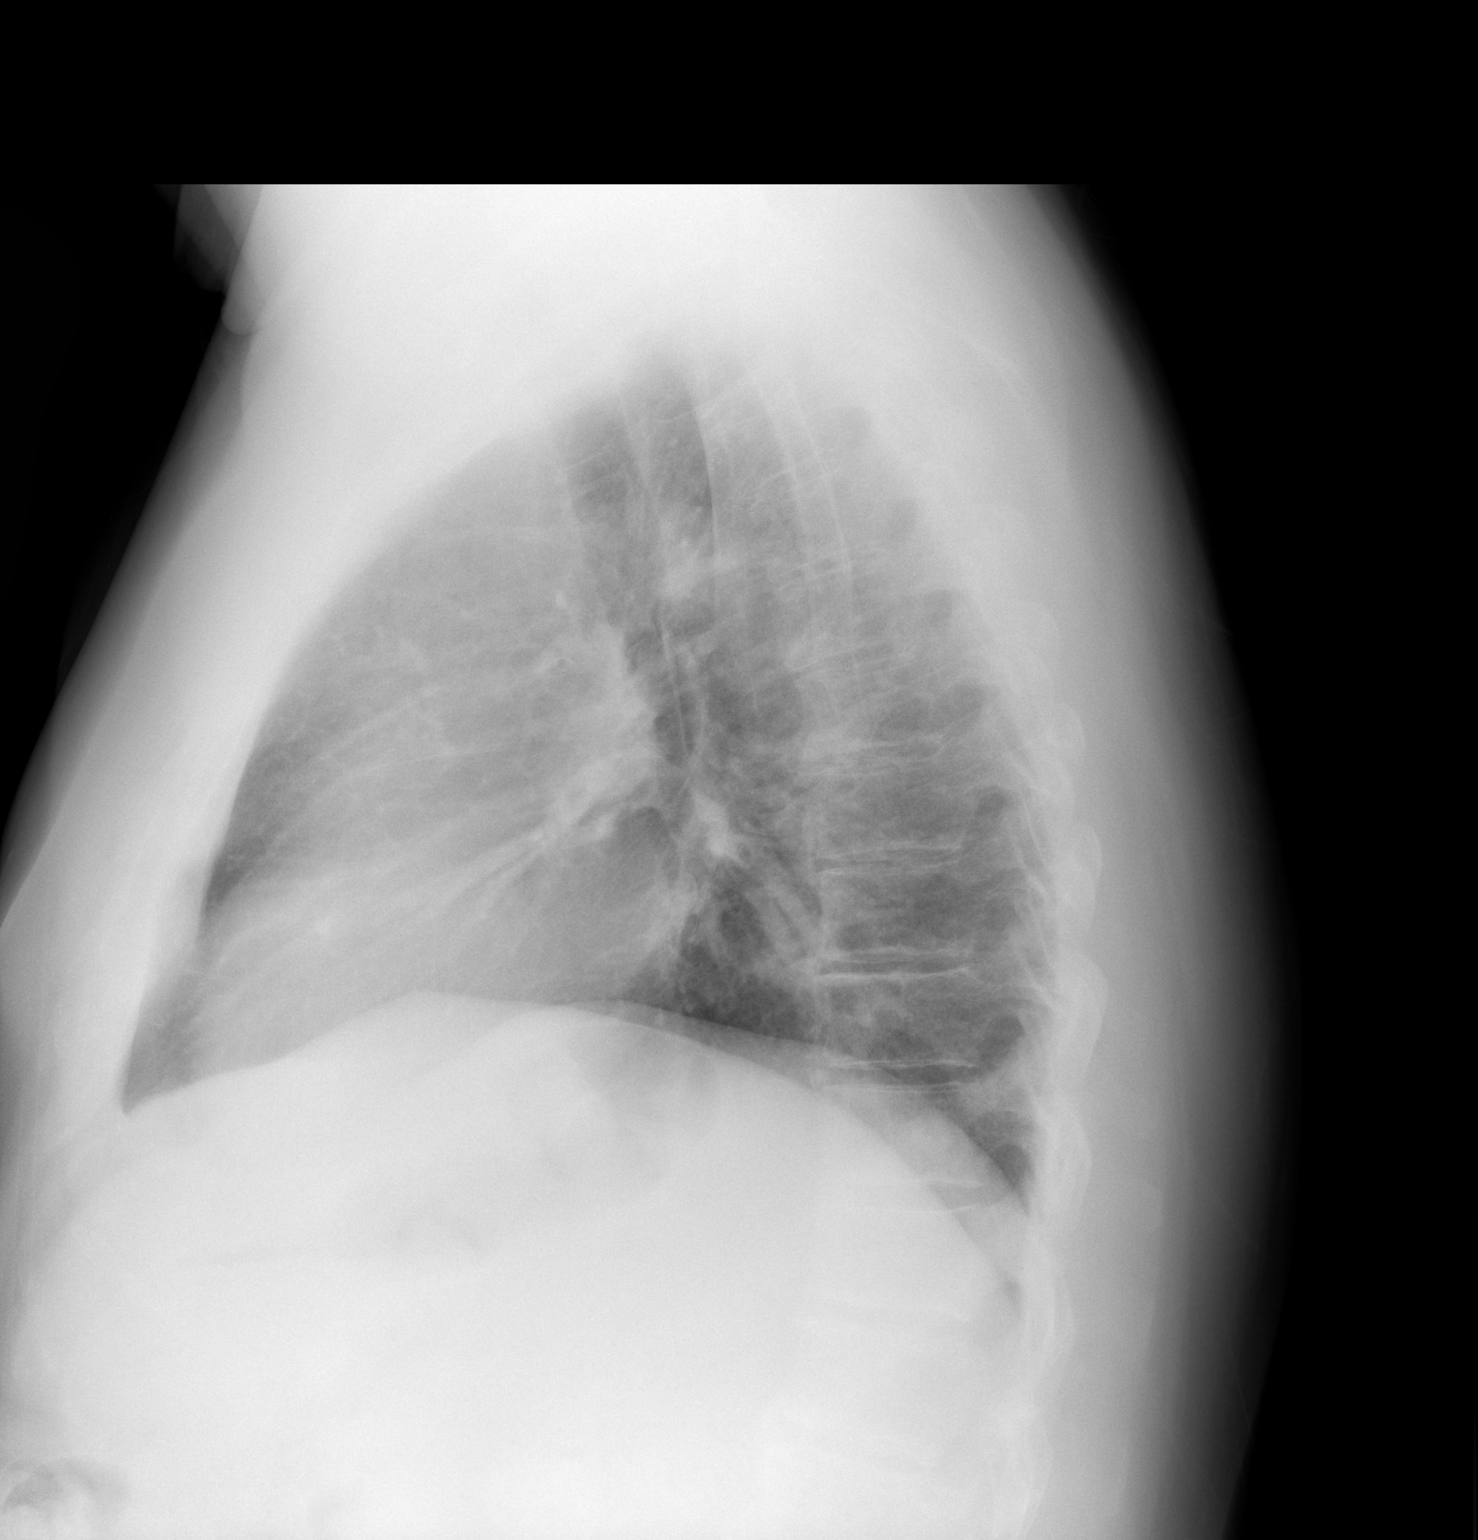

[2 of 2 positions shown; findings below may reference images not displayed]

FINDINGS: The heart size and mediastinal contours are within normal limits.
Both lungs are clear. The visualized skeletal structures are
unremarkable.
IMPRESSION: No active cardiopulmonary disease.

## 2020-11-28 MED ORDER — TRAMADOL HCL 50 MG PO TABS: ORAL_TABLET | ORAL | 0 refills | Status: DC

## 2020-11-28 MED ORDER — FUROSEMIDE 20 MG PO TABS: 20.0000 mg | ORAL_TABLET | Freq: Every day | ORAL | 1 refills | Status: DC

## 2020-11-28 NOTE — Assessment & Plan Note (Signed)
Tolerating statin, encouraged heart healthy diet, avoid trans fats, minimize simple carbs and saturated fats. Increase exercise as tolerated 

## 2020-11-28 NOTE — Telephone Encounter (Signed)
Caller : Hermenia Fiscal  Call Back @ [phone]  Fax Number @ [phone]  Tyler Brewer is requesting verbal order for skilled nursing care   Frequency : 2x2          1x2 63month x1  2PRN   Patient also needs a new glucose meter to check blood sugar   Please advise

## 2020-11-28 NOTE — Assessment & Plan Note (Signed)
Per neuro 

## 2020-11-28 NOTE — Assessment & Plan Note (Signed)
Refer to endo Check labs today Lab Results  Component Value Date   HGBA1C 10.6 (H) 11/05/2020

## 2020-11-28 NOTE — Assessment & Plan Note (Signed)
Well controlled, no changes to meds. Encouraged heart healthy diet such as the DASH diet and exercise as tolerated.  °

## 2020-11-28 NOTE — Assessment & Plan Note (Signed)
Elevate legs  Refill lasix  Check cxr

## 2020-11-28 NOTE — Assessment & Plan Note (Signed)
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

## 2020-11-28 NOTE — Telephone Encounter (Signed)
Ok to give verbal 

## 2020-11-28 NOTE — Assessment & Plan Note (Signed)
Encourage abstaining

## 2020-11-28 NOTE — Patient Instructions (Signed)
Weakness Weakness is a lack of strength. You may feel weak all over your body (generalized), or you may feel weak in one specific part of your body (focal). Common causes of weakness include:  Infection and immune system disorders.  Physical exhaustion.  Internal bleeding or other blood loss that results in a lack of red blood cells (anemia).  Dehydration.  An imbalance in mineral (electrolyte) levels, such as potassium.  Heart disease, circulation problems, or stroke. Other causes include:  Some medicines or cancer treatment.  Stress, anxiety, or depression.  Nervous system disorders.  Thyroid disorders.  Loss of muscle strength because of age or inactivity.  Poor sleep quality or sleep disorders. The cause of your weakness may not be known. Some causes of weakness can be serious, so it is important to see your health care provider. Follow these instructions at home: Activity  Rest as needed.  Try to get enough sleep. Most adults need 7-8 hours of quality sleep each night. Talk to your health care provider about how much sleep you need each night.  Do exercises, such as arm curls and leg raises, for 30 minutes at least 2 days a week or as told by your health care provider. This helps build muscle strength.  Consider working with a physical therapist or trainer who can develop an exercise plan to help you gain muscle strength. General instructions  Take over-the-counter and prescription medicines only as told by your health care provider.  Eat a healthy, well-balanced diet. This includes: ? Proteins to build muscles, such as lean meats and fish. ? Fresh fruits and vegetables. ? Carbohydrates to boost energy, such as whole grains.  Drink enough fluid to keep your urine pale yellow.  Keep all follow-up visits as told by your health care provider. This is important.   Contact a health care provider if your weakness:  Does not improve or gets worse.  Affects your  ability to think clearly.  Affects your ability to do your normal daily activities. Get help right away if you:  Develop sudden weakness, especially on one side of your face or body.  Have chest pain.  Have trouble breathing or shortness of breath.  Have problems with your vision.  Have trouble talking or swallowing.  Have trouble standing or walking.  Are light-headed or lose consciousness. Summary  Weakness is a lack of strength. You may feel weak all over your body or just in one specific part of your body.  Weakness can be caused by a variety of things. In some cases, the cause may be unknown.  Rest as needed, and try to get enough sleep. Most adults need 7-8 hours of quality sleep each night.  Eat a healthy, well-balanced diet. This information is not intended to replace advice given to you by your health care provider. Make sure you discuss any questions you have with your health care provider. Document Revised: 03/12/2018 Document Reviewed: 03/12/2018 Elsevier Patient Education  2021 Elsevier Inc.  

## 2020-11-28 NOTE — Telephone Encounter (Signed)
Left a message on clinical manager- Maggy's phone since Olin Hauser was not in the office today.

## 2020-11-28 NOTE — Chronic Care Management (AMB) (Signed)
  Care Management   Note  11/28/2020 Name: [name] MRN: [id number] DOB: [birth year]  Tyler Brewer is a 60 y.o. year old male who is a primary care patient of [name], DO and is actively engaged with the care management team. I reached out to Thomasenia Bottoms by phone today to assist with re-scheduling an initial visit with the RN Case Manager  Follow up plan: Unsuccessful telephone outreach attempt made. A HIPAA compliant phone message was left for the patient providing contact information and requesting a return call.  The care management team will reach out to the patient again over the next 5 days.  If patient returns call to provider office, please advise to call [place] Lysle Morales at [facility]

## 2020-11-28 NOTE — Telephone Encounter (Signed)
Okay for new orders?

## 2020-11-28 NOTE — Progress Notes (Signed)
Patient ID: Tyler Brewer, male    DOB: [birth year]  Age: 60 y.o. MRN: [id number]    Subjective:  Subjective  HPI Tyler Brewer presents for a hospital follow up today. He complains of bilateral LE swelling that began on 11/04/2020. He reports that it may be the result of his recent falling.He also complains of bilateral LE weakness. He endorses having his bilateral ankle rolling frequently, resulting in him falling. He also complains of SOB. He notes that it maybe the result of his sedentary lifestyle. He denies any chest pain, fever, abdominal pain, cough, chills, sore throat, dysuria, urinary incontinence, back pain, HA, or N/V/D at this time. He was admitted to the ED on 11/04/2020 for dizziness, frequent falls, and bilateral LE weakness. He was discharged on 11/21/2020. Prior to this he was in the ER several times.  He was told he might have CIPD but needs neuro f/u.     Review of Systems  Constitutional: Negative for chills, fatigue and fever.  HENT: Negative for congestion, sinus pressure, sinus pain and sore throat.   Eyes: Negative for pain.  Respiratory: Positive for shortness of breath (secondary to sedentary lifestyle). Negative for cough.   Cardiovascular: Positive for leg swelling (bilaterial LE). Negative for chest pain and palpitations.  Gastrointestinal: Negative for abdominal pain, blood in stool, constipation, diarrhea, nausea and vomiting.  Genitourinary: Negative for dysuria, frequency, hematuria and urgency.  Musculoskeletal: Negative for back pain and myalgias.  Skin: Negative for rash.  Neurological: Positive for weakness (Bilaterial LE ).    History Past Medical History:  Diagnosis Date  . Arthritis   . CAD (coronary artery disease)  cardiac cath with moderate disease in a septal branch of the ramus intermedius 04/01/2012  . Depression   . Diabetes mellitus    poorly controlled by his report  . History of narcotic addiction ([place])    past history of back pain   . Hypercholesteremia   . Hypertension   . IBS (irritable bowel syndrome)   . 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 Neuropathy   . Obesity    Max weight was 390  . OSA on CPAP   . Panic attacks   . Testosterone deficiency   . Vertigo     He has a past surgical history that includes Cardiac catheterization; left heart catheterization with coronary angiogram (N/A, 03/31/2012); IR Fluoro Guide CV Line Right (11/10/2020); and IR US Guide Vasc Access Right (11/10/2020).   His family history includes Diabetes type II in his father; Healthy in his daughter, mother, sister, and son; Hypertension in his father; Pancreatic disease (age of onset: 96) in his father; Parkinson's disease in his maternal grandmother.He reports that he quit smoking about 1 months ago. His smoking use included cigarettes. He smoked 0.50 packs per day. He has never used smokeless tobacco. He reports previous drug use. Drug: Methamphetamines. He reports that he does not drink alcohol.  Current Outpatient Medications on File Prior to Visit  Medication Sig Dispense Refill  . amLODipine (NORVASC) 10 MG tablet Take 1 tablet (10 mg total) by mouth daily. 30 tablet 0  . Chlorpheniramine Maleate (ALLERGY RELIEF PO) Take 1 tablet by mouth daily as needed (allergies).    . diclofenac Sodium (VOLTAREN) 1 % GEL Apply 2 g topically 4 (four) times daily. (Patient taking differently: Apply 2 g topically 4 (four) times daily as needed (pain).) 50 g 0  . DULoxetine (CYMBALTA) 30 MG capsule Take 1 capsule (30 mg total) by mouth daily. (Patient  taking differently: Take 30 mg by mouth 3 (three) times a week.) 60 capsule 3  . gabapentin (NEURONTIN) 300 MG capsule Take 2 tablets in the morning, 1 tablet in the afternoon, and 1 tablet at bedtime. (Patient taking differently: Take 300-600 mg by mouth See admin instructions. Take 600 mg in the morning, 300 mg in the afternoon and at bedtime) 120 capsule 5  . hydrALAZINE (APRESOLINE) 50 MG tablet Take 1  tablet (50 mg total) by mouth every 8 (eight) hours. 90 tablet 0  . HYDROcodone-acetaminophen (NORCO/VICODIN) 5-325 MG tablet Take 1 tablet by mouth every 4 (four) hours as needed. (Patient taking differently: Take 1 tablet by mouth every 4 (four) hours as needed for moderate pain.) 10 tablet 0  . insulin aspart (NOVOLOG) 100 UNIT/ML injection Inject 10 Units into the skin 3 (three) times daily with meals. 10 mL PRN  . insulin degludec (TRESIBA FLEXTOUCH) 100 UNIT/ML FlexTouch Pen Inject 40 Units into the skin daily. 3 mL 3  . losartan (COZAAR) 100 MG tablet Take 1 tablet (100 mg total) by mouth daily. 90 tablet 3  . meloxicam (MOBIC) 7.5 MG tablet Take 1 tablet (7.5 mg total) by mouth 2 (two) times daily as needed for pain. 30 tablet 2  . methocarbamol (ROBAXIN) 500 MG tablet Take 1 tablet (500 mg total) by mouth 4 (four) times daily. (Patient taking differently: Take 500 mg by mouth every 6 (six) hours as needed for muscle spasms.) 45 tablet 1  . pravastatin (PRAVACHOL) 40 MG tablet Take 1 tablet (40 mg total) by mouth daily. 30 tablet 4  . tamsulosin (FLOMAX) 0.4 MG CAPS capsule Take 1 capsule (0.4 mg total) by mouth daily. 90 capsule 3  . tobramycin (TOBREX) 0.3 % ophthalmic ointment Place into the left eye 3 (three) times daily. 3.5 g 0   No current facility-administered medications on file prior to visit.     Objective:  Objective  Physical Exam Vitals and nursing note reviewed.  Constitutional:      General: He is not in acute distress.    Appearance: Normal appearance. He is well-developed. He is not ill-appearing.  HENT:     Head: Normocephalic and atraumatic.     Right Ear: External ear normal.     Left Ear: External ear normal.     Nose: Nose normal.  Eyes:     Extraocular Movements: Extraocular movements intact.     Pupils: Pupils are equal, round, and reactive to light.  Cardiovascular:     Rate and Rhythm: Normal rate and regular rhythm.     Pulses: Normal pulses.      Heart sounds: Normal heart sounds. No murmur heard. No friction rub. No gallop.      Comments: There is bilateral edema with +1 pitting present in his LE.  Pulmonary:     Effort: Pulmonary effort is normal. No respiratory distress.     Breath sounds: Normal breath sounds. No stridor. No wheezing, rhonchi or rales.  Abdominal:     General: Bowel sounds are normal. There is no distension.     Palpations: Abdomen is soft.     Tenderness: There is no abdominal tenderness. There is no guarding.     Hernia: No hernia is present.  Musculoskeletal:        General: Normal range of motion.     Cervical back: Normal range of motion and neck supple.     Right lower leg: 1+ Pitting Edema present.     Left  lower leg: 1+ Pitting Edema present.  Skin:    General: Skin is warm and dry.  Neurological:     Mental Status: He is alert and oriented to person, place, and time.     Coordination: Coordination normal.     Comments: r >L low ext weakness   Psychiatric:        Behavior: Behavior normal.        Thought Content: Thought content normal.    BP (!) 142/82 (BP Location: Left Arm, Patient Position: Sitting, Cuff Size: Normal)   Pulse 94   Temp 98.3 F (36.8 C) (Oral)   Resp 18   Ht '5\' 11"'  (1.803 m)   Wt 282 lb 6.4 oz (128.1 kg)   SpO2 98%   BMI 39.39 kg/m  Wt Readings from Last 3 Encounters:  11/28/20 282 lb 6.4 oz (128.1 kg)  11/16/20 287 lb 7.7 oz (130.4 kg)  10/31/20 275 lb (124.7 kg)     Lab Results  Component Value Date   WBC 7.3 11/20/2020   HGB 11.4 (L) 11/20/2020   HCT 34.6 (L) 11/20/2020   PLT 246 11/20/2020   GLUCOSE 155 (H) 11/20/2020   CHOL 158 07/05/2020   TRIG 123.0 07/05/2020   HDL 50.00 07/05/2020   LDLDIRECT 124.0 05/02/2017   LDLCALC 83 07/05/2020   ALT 19 11/20/2020   AST 17 11/20/2020   NA 134 (L) 11/20/2020   K 4.0 11/20/2020   CL 105 11/20/2020   CREATININE 1.11 11/20/2020   BUN 22 (H) 11/20/2020   CO2 23 11/20/2020   TSH 2.948 11/05/2020   PSA  1.15 03/10/2020   INR 1.1 07/30/2013   HGBA1C 10.6 (H) 11/05/2020   MICROALBUR <0.7 05/02/2017    MR CERVICAL SPINE WO CONTRAST  Result Date: 11/05/2020 CLINICAL DATA:  60 year old male with progressive pain. Recurrent falls. Bilateral lower extremity weakness. Polysubstance abuse. Diabetes. EXAM: MRI CERVICAL SPINE WITHOUT CONTRAST TECHNIQUE: Multiplanar, multisequence MR imaging of the cervical spine was performed. No intravenous contrast was administered. COMPARISON:  CT cervical spine 11/01/2020. thoracic spine MRI 10/12/2020. FINDINGS: Alignment: Improved cervical lordosis from the recent CT. No significant spondylolisthesis. Vertebrae: No marrow edema or evidence of acute osseous abnormality. Visualized bone marrow signal is within normal limits. Cord: Cervical spinal cord appears to remain normal despite some mild degenerative cervical spinal stenosis detailed below. Negative visible upper thoracic cord. Posterior Fossa, vertebral arteries, paraspinal tissues: Cervicomedullary junction is within normal limits. There is patchy T2 and STIR hyperintensity in the central pons which appears nonspecific on series 7, image 8. Otherwise negative visible posterior fossa. Preserved major vascular flow voids in the neck. Vertebral arteries appear codominant. Negative visible neck soft tissues. Disc levels: C2-C3: Minor disc bulging, endplate spurring and ligament flavum hypertrophy without stenosis. C3-C4: Small central disc protrusion with annular fissure (series 9, image 14). Otherwise mild leftward disc bulging and endplate spurring mostly affecting the foramen. Mild spinal stenosis. No significant cord mass effect. Mild to moderate left C4 foraminal stenosis. C4-C5: Circumferential disc bulge with broad-based posterior component effacing the ventral CSF space. Up to mild spinal stenosis. No cord mass effect. No significant foraminal stenosis. C5-C6:  Mild endplate spurring.  Mild left C6 foraminal stenosis.  C6-C7: Left eccentric disc bulging, broad-based posterior component. Associated left foraminal disc and endplate spurring. No spinal stenosis. Moderate left C7 foraminal stenosis. C7-T1:  Negative. T1-T2 level appears stable, negative for age. IMPRESSION: 1. Small disc herniations at C3-C4 and C4-C5 result in up to mild spinal  stenosis with no significant cord mass effect. No abnormal cord signal. 2. Up to moderate degenerative neural foraminal stenosis at the left C4 and left C7 nerve levels. Mild for age cervical spine degeneration otherwise. 3. Nonspecific T2 and STIR hyperintensity in the pons. Most commonly this would be due to chronic small vessel disease. Electronically Signed   By: Genevie Ann M.D.   On: 11/05/2020 07:34   MR Lumbar Spine W Wo Contrast  Result Date: 11/05/2020 CLINICAL DATA:  60 year old male with progressive pain. Recurrent falls. Bilateral lower extremity weakness. Polysubstance abuse. Diabetes. EXAM: MRI LUMBAR SPINE WITHOUT AND WITH CONTRAST TECHNIQUE: Multiplanar and multiecho pulse sequences of the lumbar spine were obtained without and with intravenous contrast. CONTRAST:  68m GADAVIST GADOBUTROL 1 MMOL/ML IV SOLN COMPARISON:  Thoracic spine MRI 10/12/2020.  Lumbar MRI 08/17/2020. FINDINGS: Segmentation: Lumbar segmentation appears to be normal and this appears concordant with the thoracic spine numbering last month, also the prior lumbar numbering. Alignment: Stable lordosis since December. Subtle degenerative retrolisthesis of L2 on L3. Vertebrae: Stable since December round vertebral body hemangioma located centrally and L3, benign. Normal background bone marrow signal. No marrow edema or acute osseous abnormality. Intact visible sacrum and SI joints. Conus medullaris and cauda equina: Conus extends to the T12-L1 level. No lower spinal cord or conus signal abnormality. Cauda equina nerve roots appear normal. No abnormal intradural enhancement or dural thickening. Paraspinal and  other soft tissues: Stable and negative visible abdominal viscera. There is subtle lumbar rect or spine I muscle edema and enhancement on the right at L2-L3 (series 11, image 5). There is more moderate sacral erector spinae muscle edema and enhancement on the left (series 11, image 11. These are new since December. No intramuscular fluid collection. Other paraspinal soft tissues appear within normal limits. Disc levels: Negative visible lower thoracic levels. L1-L2: Stable minor disc bulging and endplate spurring without stenosis. L2-L3: Mild circumferential disc bulge. Small right paracentral disc protrusion and annular fissure appear mildly increased since December. But there is no significant spinal stenosis, mild if any associated right lateral recess stenosis (right L3 nerve level). L3-L4:  Stable mild disc bulge without stenosis. L4-L5:  Negative. L5-S1: Moderate left facet hypertrophy and degenerative facet joint fluid appear stable since December. No associated stenosis. IMPRESSION: 1. Mild nonspecific inflammation in the right erector spinae muscle at L2-L3, and on the left at S1-S2. These might be posttraumatic in this setting. 2. No other acute or inflammatory process identified in the lumbar spine. 3. Mild lumbar spine degeneration without spinal stenosis. Rightward L2-L3 disc protrusion with annular fissure does appear mildly increased since December. Query right L3 radiculitis. Moderately advanced chronic facet degeneration on the left at L5-S1. Electronically Signed   By: HGenevie AnnM.D.   On: 11/05/2020 07:41     Assessment & Plan:  Plan    Meds ordered this encounter  Medications  . furosemide (LASIX) 20 MG tablet    Sig: Take 1 tablet (20 mg total) by mouth daily.    Dispense:  90 tablet    Refill:  1  . traMADol (ULTRAM) 50 MG tablet    Sig: Take 1-2 tablets by mouth every 8 hours as needed for pain.    Dispense:  30 tablet    Refill:  0    Problem List Items Addressed This Visit       Unprioritized   Bilateral leg weakness    Neuro thought CIPD in hospital but then doubted that dx and  instructed pt to f/u neuro as out pt       Chronic low back pain   Relevant Medications   traMADol (ULTRAM) 50 MG tablet   Diabetic polyneuropathy associated with diabetes mellitus due to underlying condition ([place])    Per neuro      DM type 2, uncontrolled, 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) (Chronic)    Refer to endo Check labs today Lab Results  Component Value Date   HGBA1C 10.6 (H) 11/05/2020         Drug abuse and dependence (HCC)   Dyslipidemia (Chronic)    Tolerating statin, encouraged heart healthy diet, avoid trans fats, minimize simple carbs and saturated fats. Increase exercise as tolerated      HTN (hypertension), benign (Chronic)    Well controlled, no changes to meds. Encouraged heart healthy diet such as the DASH diet and exercise as tolerated.       Relevant Medications   furosemide (LASIX) 20 MG tablet   Hyperlipemia (Chronic)   Relevant Medications   furosemide (LASIX) 20 MG tablet   Other Relevant Orders   Lipid panel   Interstitial lung disease (HCC)   Lower extremity edema - Primary    Elevate legs  Refill lasix  Check cxr       Relevant Medications   furosemide (LASIX) 20 MG tablet   Other Relevant Orders   CBC with Differential/Platelet   Comprehensive metabolic panel   DG Chest 2 View (Completed)   Methamphetamine use disorder, severe, dependence ([place])    Encourage abstaining       Morbid obesity (HCC)   Psoriatic arthritis (HCC)   Relevant Medications   traMADol (ULTRAM) 50 MG tablet   Thoracic aortic aneurysm without rupture (HCC)   Relevant Medications   furosemide (LASIX) 20 MG tablet   Weakness   Relevant Orders   Ambulatory referral to Neurology    Other Visit Diagnoses    Uncontrolled type 2 diabetes mellitus with hyperglycemia ([place])       Relevant Orders   Ambulatory referral to Endocrinology   Multiple myeloma, remission  status unspecified ([place])   (Chronic)        Follow-up: Return in about 3 months (around 02/27/2021), or if symptoms worsen or fail to improve.   I,Gordon Zheng,acting as a Education administrator for [employer], DO.,have documented all relevant documentation on the behalf of [name], DO,as directed by  [name], DO while in the presence of [place], DO, have reviewed all documentation for this visit. The documentation on 11/28/20 for the exam, diagnosis, procedures, and orders are all accurate and complete.

## 2020-11-29 ENCOUNTER — Encounter: Payer: Self-pay | Admitting: Family Medicine

## 2020-11-29 ENCOUNTER — Other Ambulatory Visit: Payer: Self-pay | Admitting: *Deleted

## 2020-11-29 DIAGNOSIS — G8929 Other chronic pain: Secondary | ICD-10-CM

## 2020-11-29 DIAGNOSIS — Z79899 Other long term (current) drug therapy: Secondary | ICD-10-CM

## 2020-11-29 DIAGNOSIS — M545 Low back pain, unspecified: Secondary | ICD-10-CM

## 2020-11-29 LAB — COMPREHENSIVE METABOLIC PANEL
ALT: 26 U/L (ref 0–53)
AST: 10 U/L (ref 0–37)
Albumin: 3.9 g/dL (ref 3.5–5.2)
Alkaline Phosphatase: 72 U/L (ref 39–117)
BUN: 29 mg/dL — ABNORMAL HIGH (ref 6–23)
CO2: 21 mEq/L (ref 19–32)
Calcium: 10 mg/dL (ref 8.4–10.5)
Chloride: 103 mEq/L (ref 96–112)
Creatinine, Ser: 0.89 mg/dL (ref 0.40–1.50)
GFR: 95.1 mL/min (ref >60.00)
Glucose, Bld: 220 mg/dL — ABNORMAL HIGH (ref 70–99)
Potassium: 4.6 mEq/L (ref 3.5–5.1)
Sodium: 135 mEq/L (ref 135–145)
Total Bilirubin: 0.3 mg/dL (ref 0.2–1.2)
Total Protein: 6.1 g/dL (ref 6.0–8.3)

## 2020-11-29 LAB — CBC WITH DIFFERENTIAL/PLATELET
Basophils Absolute: 0.1 10*3/uL (ref 0.0–0.1)
Basophils Relative: 1.1 % (ref 0.0–3.0)
Eosinophils Absolute: 0.3 10*3/uL (ref 0.0–0.7)
Eosinophils Relative: 4.7 % (ref 0.0–5.0)
HCT: 38.1 % — ABNORMAL LOW (ref 39.0–52.0)
Hemoglobin: 12.8 g/dL — ABNORMAL LOW (ref 13.0–17.0)
Lymphocytes Relative: 28.5 % (ref 12.0–46.0)
Lymphs Abs: 1.8 10*3/uL (ref 0.7–4.0)
MCHC: 33.7 g/dL (ref 30.0–36.0)
MCV: 87.4 fl (ref 78.0–100.0)
Monocytes Absolute: 0.5 10*3/uL (ref 0.1–1.0)
Monocytes Relative: 7.8 % (ref 3.0–12.0)
Neutro Abs: 3.7 10*3/uL (ref 1.4–7.7)
Neutrophils Relative %: 57.9 % (ref 43.0–77.0)
Platelets: 416 10*3/uL — ABNORMAL HIGH (ref 150.0–400.0)
RBC: 4.35 Mil/uL (ref 4.22–5.81)
RDW: 12.9 % (ref 11.5–15.5)
WBC: 6.4 10*3/uL (ref 4.0–10.5)

## 2020-11-29 LAB — LIPID PANEL
Cholesterol: 118 mg/dL (ref 0–200)
HDL: 40 mg/dL (ref >39.00)
LDL Cholesterol: 52 mg/dL (ref 0–99)
NonHDL: 78.44
Total CHOL/HDL Ratio: 3
Triglycerides: 130 mg/dL (ref 0.0–149.0)
VLDL: 26 mg/dL (ref 0.0–40.0)

## 2020-11-30 ENCOUNTER — Encounter: Payer: Self-pay | Admitting: Family Medicine

## 2020-11-30 ENCOUNTER — Telehealth: Payer: Self-pay | Admitting: Family Medicine

## 2020-11-30 ENCOUNTER — Telehealth: Payer: Self-pay

## 2020-11-30 NOTE — Chronic Care Management (AMB) (Signed)
  Care Management   Note  11/30/2020 Name: Tyler Brewer MRN: [id number] DOB: 1961/04/06  Tyler Brewer is a 60 y.o. year old male who is a primary care patient of [name], DO and is actively engaged with the care management team. I reached out to Thomasenia Bottoms by phone today to assist with re-scheduling an initial visit with the RN Case Manager  Follow up plan: Telephone appointment with care management team member scheduled for: 12/06/2020  [facility]

## 2020-11-30 NOTE — Telephone Encounter (Signed)
Caller Name [place] Phone Number [phone] Patient Name Tyler Brewer Patient DOB Oct 30, 1960 Call Type Message Only Information Provided Reason for Call Request for General Office Information Initial Comment Caller states he is calling from [facility]. Additional Comment Provided office hours and fax number

## 2020-11-30 NOTE — Telephone Encounter (Signed)
Tyler Brewer is requesting verbal order for PT w/ frequency  of   1x1   2x4   1x4  Call Back @ [phone]

## 2020-11-30 NOTE — Chronic Care Management (AMB) (Signed)
  Care Management   Note  11/30/2020 Name: Tyler Brewer MRN: [id number] DOB: August 19, 1961  Tyler Brewer is a 60 y.o. year old male who is a primary care patient of [name], DO and is actively engaged with the care management team. I reached out to Thomasenia Bottoms by phone today to assist with re-scheduling an initial visit with the RN Case Manager  Follow up plan: Unsuccessful telephone outreach attempt made. A HIPAA compliant phone message was left for the patient providing contact information and requesting a return call.  The care management team will reach out to the patient again over the next 5 days.  If patient returns call to provider office, please advise to call [place] Lysle Morales at [facility]

## 2020-11-30 NOTE — Telephone Encounter (Signed)
Left a vm on [facility]-clinical managers phone since Annabelle Harman was not working today okay-ing the orders.

## 2020-11-30 NOTE — Telephone Encounter (Signed)
great

## 2020-11-30 NOTE — Telephone Encounter (Signed)
Okay for verbals °

## 2020-11-30 NOTE — Telephone Encounter (Signed)
yes

## 2020-12-01 ENCOUNTER — Ambulatory Visit: Payer: Medicare HMO | Admitting: Neurology

## 2020-12-01 ENCOUNTER — Telehealth: Payer: Medicare HMO

## 2020-12-02 ENCOUNTER — Encounter: Payer: Self-pay | Admitting: Family Medicine

## 2020-12-05 ENCOUNTER — Encounter: Payer: Self-pay | Admitting: Family Medicine

## 2020-12-06 ENCOUNTER — Ambulatory Visit (INDEPENDENT_AMBULATORY_CARE_PROVIDER_SITE_OTHER): Payer: Medicare HMO

## 2020-12-06 DIAGNOSIS — E785 Hyperlipidemia, unspecified: Secondary | ICD-10-CM | POA: Diagnosis not present

## 2020-12-06 DIAGNOSIS — E1140 Type 2 diabetes mellitus with diabetic neuropathy, unspecified: Secondary | ICD-10-CM | POA: Diagnosis not present

## 2020-12-06 DIAGNOSIS — IMO0002 Reserved for concepts with insufficient information to code with codable children: Secondary | ICD-10-CM

## 2020-12-06 DIAGNOSIS — E0842 Diabetes mellitus due to underlying condition with diabetic polyneuropathy: Secondary | ICD-10-CM

## 2020-12-06 DIAGNOSIS — E1165 Type 2 diabetes mellitus with hyperglycemia: Secondary | ICD-10-CM | POA: Diagnosis not present

## 2020-12-06 NOTE — Telephone Encounter (Signed)
No-- pretty much everyone has that rule or one similar

## 2020-12-06 NOTE — Telephone Encounter (Signed)
Spoke with Rosann Auerbach at [facility] and they stated that they needed dx code and ov notes.  Form completed with dx and ov notes attached.

## 2020-12-06 NOTE — Telephone Encounter (Signed)
Ok to give him another note--- and he needs to reschedule neuro app

## 2020-12-07 NOTE — Chronic Care Management (AMB) (Signed)
Chronic Care Management   CCM RN Visit Note  12/07/2020 Name: Tyler Brewer MRN: [id number] DOB: 02/09/1961  Subjective: Tyler Brewer is a 60 y.o. year old male who is a primary care patient of [name], DO. The care management team was consulted for assistance with disease management and care coordination needs.    Engaged with patient by telephone for initial visit in response to provider referral for case management and/or care coordination services.   Consent to Services:  The patient was given the following information about Chronic Care Management services today, agreed to services, and gave verbal consent: 1. CCM service includes personalized support from designated clinical staff supervised by the primary care provider, including individualized plan of care and coordination with other care providers 2. 24/7 contact phone numbers for assistance for urgent and routine care needs. 3. Service will only be billed when office clinical staff spend 20 minutes or more in a month to coordinate care. 4. Only one practitioner may furnish and bill the service in a calendar month. 5.The patient may stop CCM services at any time (effective at the end of the month) by phone call to the office staff. 6. The patient will be responsible for cost sharing (co-pay) of up to 20% of the service fee (after annual deductible is met). Patient agreed to services and consent obtained.  Patient agreed to services and verbal consent obtained.   Assessment: Review of patient past medical history, allergies, medications, health status, including review of consultants reports, laboratory and other test data, was performed as part of comprehensive evaluation and provision of chronic care management services.   SDOH (Social Determinants of Health) assessments and interventions performed: Transportation needs and food insecurity.   CCM Care Plan  No Known Allergies  Outpatient Encounter Medications as  of 12/06/2020  Medication Sig  . acetaminophen (TYLENOL) 500 MG tablet Take 500 mg by mouth every 6 (six) hours as needed. States he is taking one to two tablets a day  . amLODipine (NORVASC) 10 MG tablet Take 1 tablet (10 mg total) by mouth daily.  . Chlorpheniramine Maleate (ALLERGY RELIEF PO) Take 1 tablet by mouth daily as needed (allergies).  . diclofenac Sodium (VOLTAREN) 1 % GEL Apply 2 g topically 4 (four) times daily. (Patient taking differently: Apply 2 g topically 4 (four) times daily as needed (pain).)  . DULoxetine (CYMBALTA) 30 MG capsule Take 1 capsule (30 mg total) by mouth daily. (Patient taking differently: Take 30 mg by mouth 3 (three) times a week.)  . furosemide (LASIX) 20 MG tablet Take 1 tablet (20 mg total) by mouth daily.  [name] Kitchen gabapentin (NEURONTIN) 300 MG capsule Take 2 tablets in the morning, 1 tablet in the afternoon, and 1 tablet at bedtime. (Patient taking differently: Take 300-600 mg by mouth See admin instructions. Take 600 mg in the morning, 300 mg in the afternoon and at bedtime)  . hydrALAZINE (APRESOLINE) 50 MG tablet Take 1 tablet (50 mg total) by mouth every 8 (eight) hours.  [name] Kitchen ibuprofen (ADVIL) 200 MG tablet Take 200 mg by mouth every 6 (six) hours as needed.  . insulin aspart (NOVOLOG) 100 UNIT/ML injection Inject 10 Units into the skin 3 (three) times daily with meals.  . insulin degludec (TRESIBA FLEXTOUCH) 100 UNIT/ML FlexTouch Pen Inject 40 Units into the skin daily.  [name] Kitchen losartan (COZAAR) 100 MG tablet Take 1 tablet (100 mg total) by mouth daily.  . methocarbamol (ROBAXIN) 500 MG tablet Take 1 tablet (500 mg  total) by mouth 4 (four) times daily. (Patient taking differently: Take 500 mg by mouth every 6 (six) hours as needed for muscle spasms.)  . pravastatin (PRAVACHOL) 40 MG tablet Take 1 tablet (40 mg total) by mouth daily.  . tamsulosin (FLOMAX) 0.4 MG CAPS capsule Take 1 capsule (0.4 mg total) by mouth daily.  [name] Kitchen tobramycin (TOBREX) 0.3 % ophthalmic  ointment Place into the left eye 3 (three) times daily.  . traMADol (ULTRAM) 50 MG tablet Take 1-2 tablets by mouth every 8 hours as needed for pain.  [name] Kitchen HYDROcodone-acetaminophen (NORCO/VICODIN) 5-325 MG tablet Take 1 tablet by mouth every 4 (four) hours as needed. (Patient not taking: Reported on 12/06/2020)  . meloxicam (MOBIC) 7.5 MG tablet Take 1 tablet (7.5 mg total) by mouth 2 (two) times daily as needed for pain. (Patient not taking: Reported on 12/06/2020)   No facility-administered encounter medications on file as of 12/06/2020.    Patient Active Problem List   Diagnosis Date Noted  . Lower extremity edema 11/28/2020  . Chronic low back pain 11/28/2020  . Psoriatic arthritis ([place]) 11/28/2020  . Thoracic aortic aneurysm without rupture ([place]) 11/28/2020  . Bilateral leg weakness 11/05/2020  . Acute left-sided low back pain with right-sided sciatica 07/19/2020  . Hip pain, acute, left 07/05/2020  . Tremor 07/05/2020  . Weakness 07/05/2020  . Balance problem 03/11/2020  . Tinea cruris 03/11/2020  . Cellulitis of left groin 03/11/2020  . Acute pain of right shoulder 03/11/2020  . Interstitial lung disease ([place]) 05/02/2017  . Pansinusitis 09/26/2016  . Diabetic polyneuropathy associated with diabetes mellitus due to underlying condition ([place]) 05/08/2016  . Methamphetamine use disorder, severe, dependence ([place]) 02/11/2016  . Substance induced mood disorder ([place]) 02/11/2016  . Exertional dyspnea 11/30/2015  . ADD (attention deficit disorder) 09/29/2015  . Binge eating 09/29/2015  . Syncope 05/05/2012  . Diarrhea 05/05/2012  . Panic attacks 05/05/2012  . OSA (obstructive sleep apnea) 05/05/2012  . HTN (hypertension) 05/05/2012  . Dyslipidemia 05/05/2012  . CAD (coronary artery disease)  cardiac cath with moderate disease in a septal branch of the ramus intermedius 04/01/2012  . Chest pain 04/01/2012  . Drug abuse and dependence ([place]) 04/01/2012  . Family history of coronary  artery disease 03/31/2012  . Sleep apnea, on C-pap 03/31/2012  . Hyperlipemia 03/30/2012  . HTN (hypertension), benign 03/30/2012  . Morbid obesity ([place]) 03/30/2012  . DM type 2, uncontrolled, with neuropathy ([place]) 03/30/2012  . Depression with suicidal ideation 03/30/2012    Conditions to be addressed/monitored:HLD, DMII and lower extremity weakness/edema/fall risk  Care Plan : Quality of Life  Updates made by Luretha Rued, RN since 12/07/2020 12:00 AM    Problem: Quality of Life   Priority: Medium    Long-Range Goal: Quality of Life Maintained/Improved   Start Date: 12/06/2020  Expected End Date: 06/08/2021  This Visit's Progress: On track  Note:   Current Barriers:  [name] Kitchen Knowledge Deficits related to long term care plan to improve/manage quality of life. Client with history of chronic disease, uncontrolled DM. Patient reports a history of falls, weakness, limited mobility. Recent hospitalization 11/04/20-11/21/20 with bilateral weakness and lower extremity edema. Currently active with home health nursing, physical therapy and occupational therapy. History of chronic low back pain and acute pain to lower extremities due to swelling of feet. Managed with medications Tramadol. He states he alternates the tramadol with Tylenol and Ibuprofen. Client reports he is living in a mobile home with friends. He states his friends  are supportive. Client states limited/strained relationship with children. And has a first grandchild that he would like to be able to see.  . Chronic Disease Management support and education needs related to health management . Unable to independently stand for periods of time for house hold chores  Nurse Case Manager Clinical Goal(s):  . patient will verbalize understanding of plan for maintenance/improvement of quality of life. Work toward goal of "get back healthy enough to join [employer]" . patient will attend all scheduled medical appointments:  . patient  will work with CM clinical social worker to discuss life concerns . the patient will demonstrate ongoing self health care management ability as evidenced by attending provider visits as scheduled, taking medications as prescribed, contacting provider for new or worsening symptoms  Interventions:  . 1:1 collaboration with Carollee Herter, Alferd Apa, DO regarding development and update of comprehensive plan of care as evidenced by provider attestation and co-signature . Inter-disciplinary care team collaboration (see longitudinal plan of care) . Evaluation of current treatment plan related to patient's health conditions and patient's adherence to plan as established by provider. . Advised patient to call Neurologist appointment to reschedule appointment.  . Encouraged patient to continue therapy sessions with home health as scheduled . Reviewed medications with patient . Discussed fall prevention strategies . Discussed pain management related to swelling of feet/legs. Encouraged client to take medications as prescribed, elevate legs when sitting or laying and contact provider if treatment not effective or worsens, and continue to be active and work with physical therapy and occupational therapy. . Provided patient with written educational materials related to fall prevention . Reviewed scheduled/upcoming provider appointments including:  . Social Work referral for talk therapy . Discussed plans with patient for ongoing care management follow up and provided patient with direct contact information for care management team  Patient Goals/Self-Care Activities Contact Neurologist to reschedule office visit Schedule follow up appointment with Primary care provider- around 02/27/21 or sooner if needed. Take medications as prescribed and follow up with provider if condition is not relieved or worsens. Elevate legs feet when sitting or laying in bed. Call pharmacy for refills at least 7 days in advance of  running out of medications. Continue therapy sessions with home health providers and perform exercises provided per their recommendation. Continue to use assistive devices as recommended and minimize fall risk(eliminate small throw rugs, keep home well lite, remove clutter from walkways. Review education material regarding falls and be prepared to discuss with RNCM at next visit. Talk with social worker regarding life concerns  Follow Up Plan: The patient has been provided with contact information for the care management team and has been advised to call with any health related questions or concerns.  The care management team will reach out to the patient again over the next 30 days.    Care Plan : Diabetes Type 2 in patient with history of Hyperlipidemia  Updates made by Luretha Rued, RN since 12/07/2020 12:00 AM    Problem: Disease Progression   Priority: Medium    Long-Range Goal: Disease Progression Prevented or Minimized   Start Date: 12/06/2020  Expected End Date: 06/08/2021  This Visit's Progress: On track  Note:   Objective:  Lab Results  Component Value Date   HGBA1C 10.6 (H) 11/05/2020 .   Lab Results  Component Value Date   CREATININE 0.89 11/28/2020   CREATININE 1.11 11/20/2020   CREATININE 0.86 11/19/2020   Current Barriers:  [name] Kitchen Knowledge Deficits related to  long term care plan for management of diabetes. Client reports he has had diabetes for about 15 years. Client acknowledged HGB A1C is high. He reports has not had a glucose meter and has not been checking his blood sugar, but has a Dexcom continuous glucose monitoring system on the way from his insurance company.  . Knowledge deficit re: healthier eating plan . Limited family support, but reports he has supportive friends. Currently staying with friends. . Limited mobility . Unable to independently stand for activities such as cooking, cleaning Case Manager Clinical Goal(s):  [name] Kitchen Patient will attend initial visit  with Endocrinologist scheduled for 12/13/20 . Patient will obtain glucose meter and begin checking blood sugars. . patient will demonstrate improved adherence to prescribed treatment plan for diabetes self care/management as evidenced by: daily monitoring and recording of CBG  adherence to prescribed medication regimen contacting provider for new or worsened symptoms or questions Interventions:  . Collaboration with Webb, DO regarding development and update of comprehensive plan of care as evidenced by provider attestation and co-signature . Inter-disciplinary care team collaboration (see longitudinal plan of care) . Reviewed medications with patient and discussed importance of medication adherence . Discussed signs/symptoms of hypo and hyper glycemia . Discussed patient diet . Provided patient with written educational materials related diabetes . Provide Calendar/organizer . Reviewed scheduled/upcoming provider appointments including: PCP, neurology, endocrinologist  . Referral made to social work team for assistance with talk therapy-chronic health conditions, relationship with children strained . Review of patient status, including review of relevant laboratory completed . Discussed plans with patient for ongoing care management follow up and provided patient with direct contact information for care management team Patient Goals: -obtain glucose meter and take to endocrinologist office at next visit 12/13/20 -take medications as prescribed. -check blood sugar as recommended by provider -know blood sugar target range and when to call doctor -know target Hemoglobin A1C -enter blood sugar readings and medication or insulin into daily log and take to provider appointments - take the blood sugar meter to all doctor visits - limit fast food meals to no more than 1 per week - schedule appointment with eye doctor -review education on fall prevention and diabetes. And plan to  discuss at next visit -review calendar/organizer. Call RNCM if you have any questions regarding use -Discuss Nutrition consult with provider at next visit  Follow Up Plan: The patient has been provided with contact information for the care management team and has been advised to call with any health related questions or concerns.  The care management team will reach out to the patient again over the next 30 days.      Plan:The patient has been provided with contact information for the care management team and has been advised to call with any health related questions or concerns.  and The care management team will reach out to the patient again over the next 30 days.  Thea Silversmith, RN, MSN, BSN, CCM Care Management Coordinator [facility] [phone]

## 2020-12-07 NOTE — Patient Instructions (Addendum)
Visit Information: Thank you for taking the time to speak with me.  PATIENT GOALS:  Goals Addressed              This Visit's Progress   .  improve quality of life-get back healthy enough to join [employer] (pt-stated)        Timeframe:  Long-Range Goal Priority:  Medium Start Date:  12/06/20                           Expected End Date: 06/07/21                     Follow Up Date 01/03/21   Contact Neurologist to reschedule office visit Schedule follow up appointment with Primary care provider- around 02/27/21 or sooner if needed. Take medications as prescribed and follow up with provider if condition is not relieved or worsens. Elevate legs feet when sitting or laying in bed. Call pharmacy for refills at least 7 days in advance of running out of medications. Continue therapy sessions with home health providers and perform exercises provided per their recommendation. Continue to use assistive devices as recommended and minimize fall risk(eliminate small throw rugs, keep home well lite, remove clutter from walkways. Review education material regarding falls and be prepared to discuss with RNCM at next visit. Talk with social worker regarding life concerns     Why is this important?    Having a long-term illness can be scary.   It can also be stressful for you and your caregiver.   These steps may help.    Notes:     [name] Kitchen  Monitor and Manage My Blood Sugar        Timeframe:  Long-Range Goal Priority:  Medium Start Date:  12/06/20                           Expected End Date: 06/07/21                      Follow Up Date 01/03/21    -obtain glucose meter and take to endocrinologist office at next visit 12/13/20 -take medications as prescribed. -check blood sugar as recommended by provider -know blood sugar target range and when to call doctor -know target Hemoglobin A1C -enter blood sugar readings and medication or insulin into daily log and take to provider  appointments - take the blood sugar meter to all doctor visits - limit fast food meals to no more than 1 per week - schedule appointment with eye doctor -review education on fall prevention and diabetes. And plan to discuss at next visit -review calendar/organizer. Call RNCM if you have any questions regarding use -Discuss Nutrition consult with provider at next visit   Why is this important?    Checking your blood sugar at home helps to keep it from getting very high or very low.   Writing the results in a diary or log helps the doctor know how to care for you.   Your blood sugar log should have the time, date and the results.   Also, write down the amount of insulin or other medicine that you take.   Other information, like what you ate, exercise done and how you were feeling, will also be helpful.     Notes:        Consent to CCM Services: Mr. Claggett was given information about  Chronic Care Management services today including:  1. CCM service includes personalized support from designated clinical staff supervised by his physician, including individualized plan of care and coordination with other care providers 2. 24/7 contact phone numbers for assistance for urgent and routine care needs. 3. Service will only be billed when office clinical staff spend 20 minutes or more in a month to coordinate care. 4. Only one practitioner may furnish and bill the service in a calendar month. 5. The patient may stop CCM services at any time (effective at the end of the month) by phone call to the office staff. 6. The patient will be responsible for cost sharing (co-pay) of up to 20% of the service fee (after annual deductible is met).  Patient agreed to services and verbal consent obtained.   The patient verbalized understanding of instructions, educational materials, and care plan provided today and agreed to receive a mailed copy of patient instructions, educational materials, and care  plan.   Telephone follow up appointment with care management team member scheduled for: Jan 03, 2021 The patient has been provided with contact information for the care management team and has been advised to call with any health related questions or concerns.   Thea Silversmith, RN, MSN, BSN, CCM Care Management Coordinator [facility] [phone]   CLINICAL CARE PLAN: Patient Care Plan: Quality of Life    Problem Identified: Quality of Life   Priority: Medium  Long-Range Goal: Quality of Life Maintained/Improved   Start Date: 12/06/2020  Expected End Date: 06/08/2021  This Visit's Progress: On track  Note:   Current Barriers:  [name] Kitchen Knowledge Deficits related to long term care plan to improve/manage quality of life. Client with history of chronic disease, uncontrolled DM. Patient reports a history of falls, weakness, limited mobility. Recent hospitalization 11/04/20-11/21/20 with bilateral weakness and lower extremity edema. Currently active with home health nursing, physical therapy and occupational therapy. History of chronic low back pain and acute pain to lower extremities due to swelling of feet. Managed with medications Tramadol. He states he alternates the tramadol with Tylenol and Ibuprofen. Client reports he is living in a mobile home with friends. He states his friends are supportive. Client states limited/strained relationship with children. And has a first grandchild that he would like to be able to see.  . Chronic Disease Management support and education needs related to health management . Unable to independently stand for periods of time for house hold chores  Nurse Case Manager Clinical Goal(s):  . patient will verbalize understanding of plan for maintenance/improvement of quality of life. Work toward goal of "get back healthy enough to join [employer]" . patient will attend all scheduled medical appointments:  . patient will work with CM clinical social  worker to discuss life concerns . the patient will demonstrate ongoing self health care management ability as evidenced by attending provider visits as scheduled, taking medications as prescribed, contacting provider for new or worsening symptoms  Interventions:  . 1:1 collaboration with Carollee Herter, Alferd Apa, DO regarding development and update of comprehensive plan of care as evidenced by provider attestation and co-signature . Inter-disciplinary care team collaboration (see longitudinal plan of care) . Evaluation of current treatment plan related to patient's health conditions and patient's adherence to plan as established by provider. . Advised patient to call Neurologist appointment to reschedule appointment.  . Encouraged patient to continue therapy sessions with home health as scheduled . Reviewed medications with patient . Discussed fall prevention strategies .  Discussed pain management related to swelling of feet/legs. Encouraged client to take medications as prescribed, elevate legs when sitting or laying and contact provider if treatment not effective or worsens, and continue to be active and work with physical therapy and occupational therapy. . Provided patient with written educational materials related to fall prevention . Reviewed scheduled/upcoming provider appointments including:  . Social Work referral for talk therapy . Discussed plans with patient for ongoing care management follow up and provided patient with direct contact information for care management team  Patient Goals/Self-Care Activities Contact Neurologist to reschedule office visit Schedule follow up appointment with Primary care provider- around 02/27/21 or sooner if needed. Take medications as prescribed and follow up with provider if condition is not relieved or worsens. Elevate legs feet when sitting or laying in bed. Call pharmacy for refills at least 7 days in advance of running out of medications. Continue  therapy sessions with home health providers and perform exercises provided per their recommendation. Continue to use assistive devices as recommended and minimize fall risk(eliminate small throw rugs, keep home well lite, remove clutter from walkways. Review education material regarding falls and be prepared to discuss with RNCM at next visit. Talk with social worker regarding life concerns  Follow Up Plan: The patient has been provided with contact information for the care management team and has been advised to call with any health related questions or concerns.  The care management team will reach out to the patient again over the next 30 days.   Patient Care Plan: Diabetes Type 2 in patient with history of Hyperlipidemia    Problem Identified: Disease Progression   Priority: Medium  Long-Range Goal: Disease Progression Prevented or Minimized   Start Date: 12/06/2020  Expected End Date: 06/08/2021  This Visit's Progress: On track  Note:   Objective:  Lab Results  Component Value Date   HGBA1C 10.6 (H) 11/05/2020 .   Lab Results  Component Value Date   CREATININE 0.89 11/28/2020   CREATININE 1.11 11/20/2020   CREATININE 0.86 11/19/2020   Current Barriers:  [name] Kitchen Knowledge Deficits related to long term care plan for management of diabetes. Client reports he has had diabetes for about 15 years. Client acknowledged HGB A1C is high. He reports has not had a glucose meter and has not been checking his blood sugar, but has a Dexcom continuous glucose monitoring system on the way from his insurance company.  . Knowledge deficit re: healthier eating plan . Limited family support, but reports he has supportive friends. Currently staying with friends. . Limited mobility . Unable to independently stand for activities such as cooking, cleaning Case Manager Clinical Goal(s):  [name] Kitchen Patient will attend initial visit with Endocrinologist scheduled for 12/13/20 . Patient will obtain glucose meter and  begin checking blood sugars. . patient will demonstrate improved adherence to prescribed treatment plan for diabetes self care/management as evidenced by: daily monitoring and recording of CBG  adherence to prescribed medication regimen contacting provider for new or worsened symptoms or questions Interventions:  . Collaboration with [place], DO regarding development and update of comprehensive plan of care as evidenced by provider attestation and co-signature . Inter-disciplinary care team collaboration (see longitudinal plan of care) . Reviewed medications with patient and discussed importance of medication adherence . Discussed signs/symptoms of hypo and hyper glycemia . Discussed patient diet . Provided patient with written educational materials related diabetes . Provide Calendar/organizer . Reviewed scheduled/upcoming provider appointments including: PCP, neurology, endocrinologist  .  Referral made to social work team for assistance with talk therapy-chronic health conditions, relationship with children strained . Review of patient status, including review of relevant laboratory completed . Discussed plans with patient for ongoing care management follow up and provided patient with direct contact information for care management team Patient Goals: -obtain glucose meter and take to endocrinologist office at next visit 12/13/20 -take medications as prescribed. -check blood sugar as recommended by provider -know blood sugar target range and when to call doctor -know target Hemoglobin A1C -enter blood sugar readings and medication or insulin into daily log and take to provider appointments - take the blood sugar meter to all doctor visits - limit fast food meals to no more than 1 per week - schedule appointment with eye doctor -review education on fall prevention and diabetes. And plan to discuss at next visit -review calendar/organizer. Call RNCM if you have any questions  regarding use -Discuss Nutrition consult with provider at next visit  Follow Up Plan: The patient has been provided with contact information for the care management team and has been advised to call with any health related questions or concerns.  The care management team will reach out to the patient again over the next 30 days.

## 2020-12-08 ENCOUNTER — Telehealth: Payer: Medicare HMO

## 2020-12-08 ENCOUNTER — Telehealth: Payer: Self-pay | Admitting: *Deleted

## 2020-12-08 NOTE — Telephone Encounter (Signed)
  Chronic Care Management   Outreach Note  12/08/2020 Name: Tyler Brewer MRN: [id number] DOB: 03/09/1961  Referred by: [name], DO Reason for referral : Chronic Care Management for Symptoms Management of Diabetes Mellitus, Type 2, Uncontrolled with Neuropathy, Diabetic Polyneuropathy Associated with Diabetes Mellitus and Hyperlipidemia. Unsuccessful Initial Outreach Call Attempt.  An unsuccessful telephone outreach was attempted today. The patient was referred to the case management team for assistance with care management and care coordination.  A HIPAA compliant message was left on voicemail for patient and LCSW is currently awaiting a return call.  LCSW will make a second outreach call attempt within the next 4-5 business days, if a return call is not received from patient in the meantime.  Follow Up Plan: The patient has been provided with contact information for the care management team and has been advised to call with any health related questions or concerns.   [place] Licensed Clinical Social Worker [place] [phone]

## 2020-12-09 DIAGNOSIS — R600 Localized edema: Secondary | ICD-10-CM | POA: Insufficient documentation

## 2020-12-09 DIAGNOSIS — R202 Paresthesia of skin: Secondary | ICD-10-CM | POA: Insufficient documentation

## 2020-12-09 DIAGNOSIS — R0902 Hypoxemia: Secondary | ICD-10-CM | POA: Diagnosis not present

## 2020-12-09 DIAGNOSIS — Z8669 Personal history of other diseases of the nervous system and sense organs: Secondary | ICD-10-CM | POA: Insufficient documentation

## 2020-12-09 DIAGNOSIS — M549 Dorsalgia, unspecified: Secondary | ICD-10-CM | POA: Diagnosis not present

## 2020-12-09 DIAGNOSIS — I10 Essential (primary) hypertension: Secondary | ICD-10-CM | POA: Diagnosis not present

## 2020-12-09 DIAGNOSIS — Z794 Long term (current) use of insulin: Secondary | ICD-10-CM | POA: Insufficient documentation

## 2020-12-09 DIAGNOSIS — M6281 Muscle weakness (generalized): Secondary | ICD-10-CM | POA: Diagnosis present

## 2020-12-09 DIAGNOSIS — I2510 Atherosclerotic heart disease of native coronary artery without angina pectoris: Secondary | ICD-10-CM | POA: Diagnosis not present

## 2020-12-09 DIAGNOSIS — Z79899 Other long term (current) drug therapy: Secondary | ICD-10-CM | POA: Insufficient documentation

## 2020-12-09 DIAGNOSIS — E1165 Type 2 diabetes mellitus with hyperglycemia: Secondary | ICD-10-CM | POA: Diagnosis not present

## 2020-12-09 DIAGNOSIS — R531 Weakness: Secondary | ICD-10-CM | POA: Diagnosis not present

## 2020-12-09 DIAGNOSIS — Z87891 Personal history of nicotine dependence: Secondary | ICD-10-CM | POA: Insufficient documentation

## 2020-12-09 DIAGNOSIS — Z743 Need for continuous supervision: Secondary | ICD-10-CM | POA: Diagnosis not present

## 2020-12-09 DIAGNOSIS — M545 Low back pain, unspecified: Secondary | ICD-10-CM | POA: Diagnosis not present

## 2020-12-10 ENCOUNTER — Encounter (HOSPITAL_COMMUNITY): Payer: Self-pay

## 2020-12-10 ENCOUNTER — Other Ambulatory Visit: Payer: Self-pay

## 2020-12-10 ENCOUNTER — Emergency Department (HOSPITAL_COMMUNITY)
Admission: EM | Admit: 2020-12-10 | Discharge: 2020-12-10 | Discharge status: Against Medical Advice | Payer: Medicare HMO | Attending: Emergency Medicine | Admitting: Emergency Medicine

## 2020-12-10 DIAGNOSIS — R531 Weakness: Secondary | ICD-10-CM | POA: Diagnosis not present

## 2020-12-10 DIAGNOSIS — E1165 Type 2 diabetes mellitus with hyperglycemia: Secondary | ICD-10-CM | POA: Diagnosis not present

## 2020-12-10 DIAGNOSIS — R29898 Other symptoms and signs involving the musculoskeletal system: Secondary | ICD-10-CM

## 2020-12-10 DIAGNOSIS — Z8669 Personal history of other diseases of the nervous system and sense organs: Secondary | ICD-10-CM | POA: Diagnosis not present

## 2020-12-10 LAB — COMPREHENSIVE METABOLIC PANEL
ALT: 22 U/L (ref 0–44)
AST: 14 U/L — ABNORMAL LOW (ref 15–41)
Albumin: 3.7 g/dL (ref 3.5–5.0)
Alkaline Phosphatase: 72 U/L (ref 38–126)
Anion gap: 8 (ref 5–15)
BUN: 16 mg/dL (ref 6–20)
CO2: 26 mmol/L (ref 22–32)
Calcium: 9.3 mg/dL (ref 8.9–10.3)
Chloride: 100 mmol/L (ref 98–111)
Creatinine, Ser: 1.01 mg/dL (ref 0.61–1.24)
GFR, Estimated: >60 mL/min (ref >60)
Glucose, Bld: 315 mg/dL — ABNORMAL HIGH (ref 70–99)
Potassium: 3.8 mmol/L (ref 3.5–5.1)
Sodium: 134 mmol/L — ABNORMAL LOW (ref 135–145)
Total Bilirubin: 0.5 mg/dL (ref 0.3–1.2)
Total Protein: 6.3 g/dL — ABNORMAL LOW (ref 6.5–8.1)

## 2020-12-10 LAB — URINALYSIS, ROUTINE W REFLEX MICROSCOPIC
Bacteria, UA: NONE SEEN
Bilirubin Urine: NEGATIVE
Glucose, UA: >=500 mg/dL — AB
Hgb urine dipstick: NEGATIVE
Ketones, ur: NEGATIVE mg/dL
Leukocytes,Ua: NEGATIVE
Nitrite: NEGATIVE
Protein, ur: NEGATIVE mg/dL
Specific Gravity, Urine: 1.024 (ref 1.005–1.030)
pH: 6 (ref 5.0–8.0)

## 2020-12-10 LAB — CBC WITH DIFFERENTIAL/PLATELET
Abs Immature Granulocytes: 0.02 10*3/uL (ref 0.00–0.07)
Basophils Absolute: 0.1 10*3/uL (ref 0.0–0.1)
Basophils Relative: 1 %
Eosinophils Absolute: 0.2 10*3/uL (ref 0.0–0.5)
Eosinophils Relative: 3 %
HCT: 39 % (ref 39.0–52.0)
Hemoglobin: 13.2 g/dL (ref 13.0–17.0)
Immature Granulocytes: 0 %
Lymphocytes Relative: 25 %
Lymphs Abs: 1.7 10*3/uL (ref 0.7–4.0)
MCH: 29.9 pg (ref 26.0–34.0)
MCHC: 33.8 g/dL (ref 30.0–36.0)
MCV: 88.4 fL (ref 80.0–100.0)
Monocytes Absolute: 0.7 10*3/uL (ref 0.1–1.0)
Monocytes Relative: 11 %
Neutro Abs: 3.9 10*3/uL (ref 1.7–7.7)
Neutrophils Relative %: 60 %
Platelets: 258 10*3/uL (ref 150–400)
RBC: 4.41 MIL/uL (ref 4.22–5.81)
RDW: 12 % (ref 11.5–15.5)
WBC: 6.6 10*3/uL (ref 4.0–10.5)
nRBC: 0 % (ref 0.0–0.2)

## 2020-12-10 LAB — CBG MONITORING, ED
Glucose-Capillary: 267 mg/dL — ABNORMAL HIGH (ref 70–99)
Glucose-Capillary: 313 mg/dL — ABNORMAL HIGH (ref 70–99)

## 2020-12-10 LAB — BRAIN NATRIURETIC PEPTIDE: B Natriuretic Peptide: 30.3 pg/mL (ref 0.0–100.0)

## 2020-12-10 MED ORDER — OXYCODONE-ACETAMINOPHEN 5-325 MG PO TABS
1.0000 | ORAL_TABLET | Freq: Once | ORAL | Status: AC
  Administered 2020-12-10: 1 via ORAL
  Filled 2020-12-10: qty 1

## 2020-12-10 NOTE — ED Notes (Signed)
Pt ambulated to the bathroom with cane and minimal assistance

## 2020-12-10 NOTE — TOC Transition Note (Signed)
Transition of Care [facility]) - CM/SW Discharge Note   Patient Details  Name: Tyler Brewer MRN: [id number] Date of Birth: 20-Mar-1961  Transition of Care [facility]) CM/SW Contact:  Verdell Carmine, RN Phone Number: 12/10/2020, 11:26 AM   Clinical Narrative:     Received call back from [place] at [facility]. Orders in for full support HH and verbalizaed this to [facility]. She will be working on this.   Final next level of care: [place] Barriers to Discharge: Patient left Against Medical Advice [facility])   Patient Goals and CMS Choice Patient states their goals for this hospitalization and ongoing recovery are:: "I don't want to be home alone on the weekends." CMS Medicare.gov Compare Post Acute Care list provided to:: Patient Choice offered to / list presented to : Patient  Discharge Placement                       Discharge Plan and Services In-house Referral: Clinical Social Work                  Date DME Agency Contacted: 12/10/20 Time DME Agency Contacted: [phone] Representative spoke with at DME Agency: [facility] HH Arranged: PT,RN,OT,Nurse's Aide,Social Work          Social Determinants of Health ([place]) Interventions     Readmission Risk Interventions No flowsheet data found.

## 2020-12-10 NOTE — ED Notes (Signed)
Offered to provide pt with eye mask for relief from lights in hallway and barrier walls around for privacy. Pt declined, stating he cannot breathe when he has something over his eyes.

## 2020-12-10 NOTE — TOC Progression Note (Signed)
Transition of Care (TOC) - Progression Note    Patient Details  Name: Tyler Brewer MRN: [id number] Date of Birth: 06/08/1961  Transition of Care (TOC) CM/SW Contact  Joey R McKinney, LCSW Phone Number: 12/10/2020, 12:10 PM  Clinical Narrative: CSW contacted by RN patient was requesting transportation assistance. CSW notes patient is leaving AMA and the hospital typically will not provide transportation assistance when patient's leave AMA. Patient is notably refusing to sign the AMA until he gets a cab voucher. CSW met with patient and explained since he is leaving AMA the hospital is limited in transportation assistance as patient is not medically cleared. CSW noted patient was escalating and becoming notably irritable when she was trying to discuss with him. Ultimately, CSW discussed transportation waiver form for uber transport. CSW noted patient verbalized he understood he was leaving AMA, hospital waiver states they have no responsibility over the [employer] program. CSW noted patient responded more appropriately and signed the AMA and the waiver. CSW confirmed phone and address and will request Uber ride through [employer] as patient verbalizes he understands he is leaving AMA and understands the contents of the rider waiver form.     Expected Discharge Plan: Skilled Nursing Facility Barriers to Discharge: Patient left Against Medical Advice (AMA)  Expected Discharge Plan and Services Expected Discharge Plan: Skilled Nursing Facility In-house Referral: Clinical Social Work     Living arrangements for the past 2 months: Mobile Home                     Date DME Agency Contacted: 12/10/20 Time DME Agency Contacted: 1125 Representative spoke with at DME Agency: [facility] HH Arranged: PT,RN,OT,Nurse's Aide,Social Work           Social Determinants of Health (SDOH) Interventions    Readmission Risk Interventions No flowsheet data found.  

## 2020-12-10 NOTE — ED Provider Notes (Signed)
MSE was initiated and I personally evaluated the patient and placed orders (if any) at  12:45 AM on December 10, 2020.  Patient with T2DM, long history back pain/issues, presents with progressive generalized weakness, fatigue, urinary frequency, LE swelling. No SOB, cough, fever, CP, AP, nausea or diarrhea.   Overall well appearing patient in recliner Bilateral LE edema  The patient appears stable so that the remainder of the MSE may be completed by another provider.   Charlann Lange, PA-C 12/10/20 [place], April, MD 12/10/20 [phone]

## 2020-12-10 NOTE — ED Notes (Signed)
Pt moved to hallway for incoming EMS patient to room. Process explained to patient, and he is now requesting to leave immediately d/t not wanting to be in the hallway. Pt cursing at staff and being disrespectful. This RN advised pt to stop talking to staff in this manner. PA notified.

## 2020-12-10 NOTE — ED Notes (Signed)
Pt refusing to sign AMA form. Sts he will not sign until someone gives him a cab voucher.

## 2020-12-10 NOTE — TOC Initial Note (Signed)
Transition of Care [facility]) - Initial/Assessment Note    Patient Details  Name: Tyler Brewer MRN: [id number] Date of Birth: [birth year]  Transition of Care [facility]) CM/SW Contact:    Oretha Milch, LCSW Phone Number: 12/10/2020, 11:16 AM  Clinical Narrative: TOC received ED consult for disposition support. PT had evaluated patient and recommended SNF placement in context to patient having no caregiver and support at home. CSW discussed with ED PA and RNCM on options and anticipated barriers. CSW met with patient and discussed recommendation and reviewed process for locating a SNF bed and discussed openly anticipated barriers. Patient reported "If I have to wait in a hallway I'm going to go home. I just want to be here in this room," and verbalized understanding when CSW explained to him that securing a bed and insurance authorization with his provider would likely result in a scenario where Monday would be the earliest possible day to transfer to SNF.   Unfortunately, as CSW was discussing SNF patient's RN came in to transfer patient to hallway bed. CSW noted RN explained he was being moved and patient immediately stated he would just go home and to discharge him now. CSW discussed following up with a [place] SW and noted patient expressed frustration that [facility] (now [facility]) is following him but he feels they don't see him enough. Patient reported he would not wait in the hallway and was adamant he wanted to leave. CSW updated PA and RNCM of patient's request and possible benefit with a HH SW order.                 Expected Discharge Plan: Skilled Nursing Facility Barriers to Discharge: Patient left Against Medical Advice [facility])   Patient Goals and CMS Choice Patient states their goals for this hospitalization and ongoing recovery are:: "I don't want to be home alone on the weekends." CMS Medicare.gov Compare Post Acute Care list provided to:: Patient Choice offered to / list presented to :  Patient  Expected Discharge Plan and Services Expected Discharge Plan: [place] In-house Referral: Clinical Social Work     Living arrangements for the past 2 months: Mobile Home                                      Prior Living Arrangements/Services Living arrangements for the past 2 months: Mobile Home Lives with:: Self   Do you feel safe going back to the place where you live?: Yes      Need for Family Participation in Patient Care: No (Comment) Care giver support system in place?: No (comment)   Criminal Activity/Legal Involvement Pertinent to Current Situation/Hospitalization: No - Comment as needed  Activities of Daily Living      Permission Sought/Granted                  Emotional Assessment Appearance:: Appears stated age Attitude/Demeanor/Rapport: Apprehensive Affect (typically observed): Anxious Orientation: : Oriented to Self,Oriented to Place,Oriented to  Time,Oriented to Situation Alcohol / Substance Use: Tobacco Use,Alcohol Use,Illicit Drugs Psych Involvement: No (comment)  Admission diagnosis:  back pain Patient Active Problem List   Diagnosis Date Noted  . Lower extremity edema 11/28/2020  . Chronic low back pain 11/28/2020  . Psoriatic arthritis ([place]) 11/28/2020  . Thoracic aortic aneurysm without rupture ([place]) 11/28/2020  . Bilateral leg weakness 11/05/2020  . Acute left-sided low back pain with right-sided sciatica 07/19/2020  .  Hip pain, acute, left 07/05/2020  . Tremor 07/05/2020  . Weakness 07/05/2020  . Balance problem 03/11/2020  . Tinea cruris 03/11/2020  . Cellulitis of left groin 03/11/2020  . Acute pain of right shoulder 03/11/2020  . Interstitial lung disease ([place]) 05/02/2017  . Pansinusitis 09/26/2016  . Diabetic polyneuropathy associated with diabetes mellitus due to underlying condition ([place]) 05/08/2016  . Methamphetamine use disorder, severe, dependence ([place]) 02/11/2016  . Substance induced mood  disorder ([place]) 02/11/2016  . Exertional dyspnea 11/30/2015  . ADD (attention deficit disorder) 09/29/2015  . Binge eating 09/29/2015  . Syncope 05/05/2012  . Diarrhea 05/05/2012  . Panic attacks 05/05/2012  . OSA (obstructive sleep apnea) 05/05/2012  . HTN (hypertension) 05/05/2012  . Dyslipidemia 05/05/2012  . CAD (coronary artery disease)  cardiac cath with moderate disease in a septal branch of the ramus intermedius 04/01/2012  . Chest pain 04/01/2012  . Drug abuse and dependence ([place]) 04/01/2012  . Family history of coronary artery disease 03/31/2012  . Sleep apnea, on C-pap 03/31/2012  . Hyperlipemia 03/30/2012  . HTN (hypertension), benign 03/30/2012  . Morbid obesity ([place]) 03/30/2012  . DM type 2, uncontrolled, with neuropathy ([place]) 03/30/2012  . Depression with suicidal ideation 03/30/2012   PCP:  [name], DO Pharmacy:   [facility] [phone] - [place] - [address] AT [place] [address] [place] [address] Phone: [phone] Fax: [phone]     Social Determinants of Health (SDOH) Interventions    Readmission Risk Interventions No flowsheet data found.

## 2020-12-10 NOTE — Care Management (Signed)
Patient states he has Home Health services. He was set up on 4/4 with [facility] ( [facility] I called Their weekend number to confirm that he was still active with them and left a message.

## 2020-12-10 NOTE — Discharge Instructions (Addendum)
PT recommended rehab facility  You requested discharge [facility]  I have placed an order for home health aide, nursing, PT  Return for worsening symptoms

## 2020-12-10 NOTE — Evaluation (Addendum)
Physical Therapy Evaluation Patient Details Name: Tyler Brewer MRN: [id number] DOB: 1961-02-02 Today's Date: 12/10/2020   History of Present Illness  60 y.o. male presents to [facility] ED on 4.23.2022 with reports of progressive LE weakeness, back pain, and fatigue. Pt has been admitted twice since march 19th for similar symptoms and has denied inpatient rehab placement. PMH includes obesity, HLD, HTN, IBS, CAD, T2DM, OSA, chronic back pain, chronic inflammatory demyelinating polyneuropathy (CIPD), methamphetamine addiction.  Clinical Impression  Pt presents to PT with deficits in strength, power, sensation, functional mobility, balance, gait, and endurance. Pt reports significant pain throughout entire body which limits his mobility. Pt self-reports that if his pain was reduced he would move much better. Pt presents with inconsistent LE strength, with LLE weakness and limited AROM to formal MMT, but is later able to utilize BLE to reposition in bed and scoot up towards the head of bed. Of note, nursing staff reports pt has been able to ambulate well over 50' without physical assistance and with use of cane to walk to bathroom. Pt is unable to identify caregiver support at this time, and reports being unable to care for himself in the home. Due to intermittent weakness increasing the patient's falls risk PT is recommending SNF placement. Acute PT will continue to follow to improve mobility quality and reduce falls risk.    Follow Up Recommendations SNF    Equipment Recommendations  Wheelchair (measurements PT) (wheelchair if D/C home)    Recommendations for Other Services       Precautions / Restrictions Precautions Precautions: Fall Restrictions Weight Bearing Restrictions: No      Mobility  Bed Mobility Overal bed mobility: Needs Assistance Bed Mobility: Supine to Sit;Sit to Supine;Rolling Rolling: Supervision   Supine to sit: Supervision Sit to supine: Supervision         Transfers Overall transfer level: Needs assistance Equipment used: Quad cane Transfers: Sit to/from Stand Sit to Stand: Min guard            Ambulation/Gait Ambulation/Gait assistance: Min guard Gait Distance (Feet): 30 Feet Assistive device: Rolling walker (2 wheeled);Quad cane (pt ambulates for 10' with quad cane and 20' with RW) Gait Pattern/deviations: Step-to pattern;Decreased dorsiflexion - left Gait velocity: reduced Gait velocity interpretation: <1.31 ft/sec, indicative of household ambulator General Gait Details: pt with short step-to gait, reduced gait speed, mild hyperextension of L knee during stance phase of gait  Stairs            Wheelchair Mobility    Modified Rankin (Stroke Patients Only)       Balance Overall balance assessment: Needs assistance Sitting-balance support: No upper extremity supported;Feet supported Sitting balance-Leahy Scale: Fair     Standing balance support: Single extremity supported;Bilateral upper extremity supported Standing balance-Leahy Scale: Poor Standing balance comment: reliant on UE support of cane or RW                             Pertinent Vitals/Pain Pain Assessment: 0-10 Pain Score: 9  Pain Location: entire body Pain Descriptors / Indicators: Burning Pain Intervention(s): Patient requesting pain meds-RN notified    Home Living Family/patient expects to be discharged to:: Private residence Living Arrangements: Alone (pt reports his roommate is now in jail) Available Help at Discharge: Other (Comment) (unable to identify) Type of Home: Mobile home Home Access: Ramped entrance     Home Layout: One level Home Equipment: Stark - 2 wheels;Cane - quad;Cane - single  point      Prior Function Level of Independence: Independent with assistive device(s)         Comments: pt ambulates with cane at baseline. Pt reports having increased difficulty over the past 3 days mobilizing, unable to make  it to the bathroom in time or perform ADLs     Hand Dominance        Extremity/Trunk Assessment   Upper Extremity Assessment Upper Extremity Assessment: Overall WFL for tasks assessed    Lower Extremity Assessment Lower Extremity Assessment: RLE deficits/detail;LLE deficits/detail RLE Deficits / Details: RLE grossly 4/5 RLE Sensation: decreased light touch LLE Deficits / Details: L knee extension 4-/5 to MMT, inconsistent use of LLE. Pt is able to push through LLE to scoot and mobilize in bed, pt demonstrates no L knee buckling with gait or transfers. LLE Sensation: decreased light touch    Cervical / Trunk Assessment Cervical / Trunk Assessment: Normal  Communication   Communication: No difficulties  Cognition Arousal/Alertness: Awake/alert Behavior During Therapy: WFL for tasks assessed/performed Overall Cognitive Status: Within Functional Limits for tasks assessed                                        General Comments General comments (skin integrity, edema, etc.): VSS on RA. PT notes BLE edema    Exercises     Assessment/Plan    PT Assessment Patient needs continued PT services  PT Problem List Decreased strength;Decreased activity tolerance;Decreased balance;Decreased mobility;Decreased safety awareness;Decreased knowledge of precautions;Pain;Impaired sensation       PT Treatment Interventions DME instruction;Gait training;Functional mobility training;Therapeutic activities;Balance training;Neuromuscular re-education;Therapeutic exercise;Patient/family education    PT Goals (Current goals can be found in the Care Plan section)  Acute Rehab PT Goals Patient Stated Goal: to reduce pain and improve mobility PT Goal Formulation: With patient Time For Goal Achievement: 12/24/20 Potential to Achieve Goals: Fair Additional Goals Additional Goal #1: Pt will maintain dynamic standing balance, reaching at least 2 feet outside of base of support, with  supervision.    Frequency Min 3X/week (3x for possible progression home)   Barriers to discharge Decreased caregiver support      Co-evaluation               AM-PAC PT "6 Clicks" Mobility  Outcome Measure Help needed turning from your back to your side while in a flat bed without using bedrails?: A Little Help needed moving from lying on your back to sitting on the side of a flat bed without using bedrails?: A Little Help needed moving to and from a bed to a chair (including a wheelchair)?: A Little Help needed standing up from a chair using your arms (e.g., wheelchair or bedside chair)?: A Little Help needed to walk in hospital room?: A Little Help needed climbing 3-5 steps with a railing? : A Lot 6 Click Score: 17    End of Session Equipment Utilized During Treatment: Gait belt Activity Tolerance: Patient limited by pain Patient left: in bed;with call bell/phone within reach Nurse Communication: Mobility status PT Visit Diagnosis: Unsteadiness on feet (R26.81);Muscle weakness (generalized) (M62.81);Pain Pain - part of body:  (entire body)    Time: 8182-9937 PT Time Calculation (min) (ACUTE ONLY): 19 min   Charges:   PT Evaluation $PT Eval Low Complexity: [place], PT, DPT Acute Rehabilitation Pager: [phone]  Zenaida Niece 12/10/2020, 10:14 AM

## 2020-12-10 NOTE — ED Provider Notes (Cosign Needed Addendum)
1050: Discussed with PT who reports "patient weakness seems very effort dependent.  Patient self reporting that his mobility would be a lot better if his pain was better controlled.  Very inconsistent LE strength throughout session.  He reports not having any caregiver support and seems to feel he is unable to care for himself currently.  If he is able to identify any caregiver support to provide supervision for mobility and assistance for ADLs he likely could go home". PT recommending SNF placement.   I spoke with patient. Eating lunch.  I encouraged discharge with PT and [place] orders.  States he had a friend who lived with him but he kicked her out because she had heroin.  He says he can't walk, cook for himself, go to the bathroom. He doesn't want to go home. States he started dropping things yesterday.  He said [facility]? got approved last time when he was admitted recently. He is making several demands including requesting more pain medicine. Difficult situation. PT has documented SNF placement.   Chart reviewed. Labs reviewed. I see no indication for medical admission other than generalized weakness.  Neurology does not recommend interventions or plasmapheresis.    Difficult situation.  Discussed with EDP. Discussed with SW who will evaluate for discharge needs/placements.    Of note explained to patient this process could take days. Encouraged to call a friend or family to help at home but he states he doesn't have anyone.    1110: RN Sarah notified me patient requesting discharge. Per RN patient had to be moved out of room for another patient.  Met with patient again.  Explained I have no control over patient placement as patient volumes increase.  Encouraged him to follow through with PT especially given that he expressed initially he couldn't take care of himself at home.  He states he has no privacy in hall bed and "nobody in their right mind would want to stay in the hall".  Joey SW has met with  patient, placed calls for SNFs and awaiting call back.  Per patient request will dc AMA.  I have placed [place] orders at discharge.  Physical Exam  BP (!) 156/93   Pulse 85   Temp 98.1 F (36.7 C) (Oral)   Resp 16   Ht '5\' 11"'  (1.803 m)   Wt 128.1 kg   SpO2 97%   BMI 39.39 kg/m     ED Course/Procedures   Clinical Course as of 12/10/20 1114  Sat Dec 10, 2020  0642 Chronic back pain  H/o DM meth use quit, obesity Progressive weakness in lower L>R, fatigue No fever, CP SOB cough, vomiting abdominal pain Symptoms similar but worse than last hospitalization (extensive work up) CIDP - chronic inflammatory demyel poly Plasmapharesis declined rehab dc home Ambulated to bathroom here with cane without assistance  Hyperglycemia here A1C 12 [place] - no need plasmapheresis  PT to eval, suspect will recommend discharge   [CG]    Clinical Course User Index [CG] Arabella Merles, PA-C 12/10/20 [place], [place], [place] 12/10/20 1114

## 2020-12-10 NOTE — ED Provider Notes (Cosign Needed Addendum)
[facility] EMERGENCY DEPARTMENT Provider Note   CSN: [id number] Arrival date & time: 12/09/20  2335     History Chief Complaint  Patient presents with  . Back Pain    Tyler Brewer is a 60 y.o. male.  Patient with a history of obesity, HLD, HTN, IBS, CAD, T2DM, OSA, chronic back pain, chronic inflammatory demyelinating polyneuropathy (CIPD), methamphetamine addiction presents with progressive lower extremity weakness, increased back pain, and fatigue/increased sleeping. Symptoms started over the last several days. He reports he is having difficulty getting to the bathroom before having urinary leakage. He was recently admitted 3/19 to 4/5 with extensive work up of similar symptoms, he reports, ultimately with a diagnosis of CIPD. On discharge, he was to go to a rehab for strengthening but decided to go home feeling like he had enough support at home. He returns today because he feels he is having similar symptoms when admitted before "and I wanted to get it before it got bad". He is not falling like before but feels he is getting weaker and is fearful he will fall and sustain injury. He is currently living alone.  The history is provided by the patient. No language interpreter was used.  Back Pain Associated symptoms: numbness and weakness   Associated symptoms: no abdominal pain and no fever        Past Medical History:  Diagnosis Date  . Arthritis   . CAD (coronary artery disease)  cardiac cath with moderate disease in a septal branch of the ramus intermedius 04/01/2012  . Depression   . Diabetes mellitus    poorly controlled by his report  . History of narcotic addiction ([place])    past history of back pain  . Hypercholesteremia   . Hypertension   . IBS (irritable bowel syndrome)   . 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[place])   . Neuropathy   . Obesity    Max weight was 390  . OSA on CPAP   . Panic attacks   . Testosterone deficiency   . Vertigo     Patient  Active Problem List   Diagnosis Date Noted  . Lower extremity edema 11/28/2020  . Chronic low back pain 11/28/2020  . Psoriatic arthritis ([place]) 11/28/2020  . Thoracic aortic aneurysm without rupture ([place]) 11/28/2020  . Bilateral leg weakness 11/05/2020  . Acute left-sided low back pain with right-sided sciatica 07/19/2020  . Hip pain, acute, left 07/05/2020  . Tremor 07/05/2020  . Weakness 07/05/2020  . Balance problem 03/11/2020  . Tinea cruris 03/11/2020  . Cellulitis of left groin 03/11/2020  . Acute pain of right shoulder 03/11/2020  . Interstitial lung disease ([place]) 05/02/2017  . Pansinusitis 09/26/2016  . Diabetic polyneuropathy associated with diabetes mellitus due to underlying condition ([place]) 05/08/2016  . Methamphetamine use disorder, severe, dependence ([place]) 02/11/2016  . Substance induced mood disorder ([place]) 02/11/2016  . Exertional dyspnea 11/30/2015  . ADD (attention deficit disorder) 09/29/2015  . Binge eating 09/29/2015  . Syncope 05/05/2012  . Diarrhea 05/05/2012  . Panic attacks 05/05/2012  . OSA (obstructive sleep apnea) 05/05/2012  . HTN (hypertension) 05/05/2012  . Dyslipidemia 05/05/2012  . CAD (coronary artery disease)  cardiac cath with moderate disease in a septal branch of the ramus intermedius 04/01/2012  . Chest pain 04/01/2012  . Drug abuse and dependence ([place]) 04/01/2012  . Family history of coronary artery disease 03/31/2012  . Sleep apnea, on C-pap 03/31/2012  . Hyperlipemia 03/30/2012  . HTN (hypertension), benign 03/30/2012  . Morbid  obesity ([place]) 03/30/2012  . DM type 2, uncontrolled, with neuropathy ([place]) 03/30/2012  . Depression with suicidal ideation 03/30/2012    Past Surgical History:  Procedure Laterality Date  . CARDIAC CATHETERIZATION    . IR FLUORO GUIDE CV LINE RIGHT  11/10/2020  . IR US GUIDE VASC ACCESS RIGHT  11/10/2020  . LEFT HEART CATHETERIZATION WITH CORONARY ANGIOGRAM N/A 03/31/2012   Procedure: LEFT HEART  CATHETERIZATION WITH CORONARY ANGIOGRAM;  Surgeon: Leonie Man, MD;  Location: [facility] CATH LAB;  Service: Cardiovascular;  Laterality: N/A;       Family History  Problem Relation Age of Onset  . Diabetes type II Father   . Hypertension Father   . Pancreatic disease Father 5       Deceased  . Healthy Mother   . Healthy Sister   . Healthy Son   . Healthy Daughter   . Parkinson's disease Maternal Grandmother     Social History   Tobacco Use  . Smoking status: Former Smoker    Packs/day: 0.50    Types: Cigarettes    Quit date: 09/29/2020    Years since quitting: 0.1  . Smokeless tobacco: Never Used  Vaping Use  . Vaping Use: Never used  Substance Use Topics  . Alcohol use: No    Alcohol/week: 0.0 standard drinks  . Drug use: Not Currently    Types: Methamphetamines    Home Medications Prior to Admission medications   Medication Sig Start Date End Date Taking? Authorizing Provider  acetaminophen (TYLENOL) 500 MG tablet Take 500 mg by mouth every 6 (six) hours as needed. States he is taking one to two tablets a day    [provider]  amLODipine (NORVASC) 10 MG tablet Take 1 tablet (10 mg total) by mouth daily. 11/21/20   Thurnell Lose, MD  Chlorpheniramine Maleate (ALLERGY RELIEF PO) Take 1 tablet by mouth daily as needed (allergies).    [provider]  diclofenac Sodium (VOLTAREN) 1 % GEL Apply 2 g topically 4 (four) times daily. Patient taking differently: Apply 2 g topically 4 (four) times daily as needed (pain). 08/27/20   Corena Herter, PA-C  DULoxetine (CYMBALTA) 30 MG capsule Take 1 capsule (30 mg total) by mouth daily. Patient taking differently: Take 30 mg by mouth 3 (three) times a week. 07/19/20   [name], DO  furosemide (LASIX) 20 MG tablet Take 1 tablet (20 mg total) by mouth daily. 11/28/20   [name], DO  gabapentin (NEURONTIN) 300 MG capsule Take 2 tablets in the morning, 1 tablet in the afternoon, and 1 tablet  at bedtime. Patient taking differently: Take 300-600 mg by mouth See admin instructions. Take 600 mg in the morning, 300 mg in the afternoon and at bedtime 10/27/20   Narda Amber K, DO  hydrALAZINE (APRESOLINE) 50 MG tablet Take 1 tablet (50 mg total) by mouth every 8 (eight) hours. 11/21/20   Thurnell Lose, MD  HYDROcodone-acetaminophen (NORCO/VICODIN) 5-325 MG tablet Take 1 tablet by mouth every 4 (four) hours as needed. Patient not taking: Reported on 12/06/2020 11/01/20   Henderly, Britni A, PA-C  ibuprofen (ADVIL) 200 MG tablet Take 200 mg by mouth every 6 (six) hours as needed.    [provider]  insulin aspart (NOVOLOG) 100 UNIT/ML injection Inject 10 Units into the skin 3 (three) times daily with meals. 07/19/20   [name] R, DO  insulin degludec (TRESIBA FLEXTOUCH) 100 UNIT/ML FlexTouch Pen Inject  40 Units into the skin daily. 10/31/20   [name], DO  losartan (COZAAR) 100 MG tablet Take 1 tablet (100 mg total) by mouth daily. 07/05/20   [name], DO  meloxicam (MOBIC) 7.5 MG tablet Take 1 tablet (7.5 mg total) by mouth 2 (two) times daily as needed for pain. Patient not taking: Reported on 12/06/2020 07/28/20   Leandrew Koyanagi, MD  methocarbamol (ROBAXIN) 500 MG tablet Take 1 tablet (500 mg total) by mouth 4 (four) times daily. Patient taking differently: Take 500 mg by mouth every 6 (six) hours as needed for muscle spasms. 08/26/20   [name], DO  pravastatin (PRAVACHOL) 40 MG tablet Take 1 tablet (40 mg total) by mouth daily. 03/10/20   [name], DO  tamsulosin (FLOMAX) 0.4 MG CAPS capsule Take 1 capsule (0.4 mg total) by mouth daily. 03/10/20   [name], DO  tobramycin (TOBREX) 0.3 % ophthalmic ointment Place into the left eye 3 (three) times daily. 11/21/20   Thurnell Lose, MD  traMADol (ULTRAM) 50 MG tablet Take 1-2 tablets by mouth every 8 hours as needed for pain. 11/28/20   [name], DO     Allergies    Patient has no known allergies.  Review of Systems   Review of Systems  Constitutional: Positive for fatigue. Negative for chills and fever.  HENT: Negative.   Respiratory: Negative.  Negative for cough and shortness of breath.   Cardiovascular: Negative.   Gastrointestinal: Negative.  Negative for abdominal pain and vomiting.  Genitourinary:       See HPI.  Musculoskeletal: Positive for back pain.  Skin: Negative.   Neurological: Positive for weakness and numbness.    Physical Exam Updated Vital Signs BP 140/71   Pulse 80   Temp 98.1 F (36.7 C) (Oral)   Resp 18   Ht 5\' 11"  (1.803 m)   Wt 128.1 kg   SpO2 97%   BMI 39.39 kg/m   Physical Exam Vitals and nursing note reviewed.  Constitutional:      General: He is not in acute distress.    Appearance: He is well-developed. He is obese.  HENT:     Head: Normocephalic.  Cardiovascular:     Rate and Rhythm: Normal rate and regular rhythm.  Pulmonary:     Effort: Pulmonary effort is normal.     Breath sounds: Normal breath sounds. No wheezing, rhonchi or rales.  Abdominal:     General: Bowel sounds are normal.     Palpations: Abdomen is soft.     Tenderness: There is no abdominal tenderness. There is no guarding or rebound.  Musculoskeletal:        General: Normal range of motion.     Cervical back: Normal range of motion and neck supple.     Right lower leg: Edema present.     Left lower leg: Edema present.     Comments: Left LE weakness on plantar and dorsiflexion.   Skin:    General: Skin is warm and dry.     Findings: No rash.  Neurological:     Mental Status: He is alert and oriented to person, place, and time.  Psychiatric:        Mood and Affect: Mood normal.     ED Results / Procedures / Treatments   Labs (all labs ordered are listed, but only abnormal results are displayed) Labs Reviewed  COMPREHENSIVE METABOLIC PANEL -  Abnormal; Notable for the following components:      Result  Value   Sodium 134 (*)    Glucose, Bld 315 (*)    Total Protein 6.3 (*)    AST 14 (*)    All other components within normal limits  URINALYSIS, ROUTINE W REFLEX MICROSCOPIC - Abnormal; Notable for the following components:   APPearance HAZY (*)    Glucose, UA >=500 (*)    All other components within normal limits  CBG MONITORING, ED - Abnormal; Notable for the following components:   Glucose-Capillary 313 (*)    All other components within normal limits  CBG MONITORING, ED - Abnormal; Notable for the following components:   Glucose-Capillary 267 (*)    All other components within normal limits  URINE CULTURE  CBC WITH DIFFERENTIAL/PLATELET  BRAIN NATRIURETIC PEPTIDE   Results for orders placed or performed during the hospital encounter of 12/10/20  CBC with Differential  Result Value Ref Range   WBC 6.6 4.0 - 10.5 K/uL   RBC 4.41 4.22 - 5.81 MIL/uL   Hemoglobin 13.2 13.0 - 17.0 g/dL   HCT 39.0 39.0 - 52.0 %   MCV 88.4 80.0 - 100.0 fL   MCH 29.9 26.0 - 34.0 pg   MCHC 33.8 30.0 - 36.0 g/dL   RDW 12.0 11.5 - 15.5 %   Platelets 258 150 - 400 K/uL   nRBC 0.0 0.0 - 0.2 %   Neutrophils Relative % 60 %   Neutro Abs 3.9 1.7 - 7.7 K/uL   Lymphocytes Relative 25 %   Lymphs Abs 1.7 0.7 - 4.0 K/uL   Monocytes Relative 11 %   Monocytes Absolute 0.7 0.1 - 1.0 K/uL   Eosinophils Relative 3 %   Eosinophils Absolute 0.2 0.0 - 0.5 K/uL   Basophils Relative 1 %   Basophils Absolute 0.1 0.0 - 0.1 K/uL   Immature Granulocytes 0 %   Abs Immature Granulocytes 0.02 0.00 - 0.07 K/uL  Comprehensive metabolic panel  Result Value Ref Range   Sodium 134 (L) 135 - 145 mmol/L   Potassium 3.8 3.5 - 5.1 mmol/L   Chloride 100 98 - 111 mmol/L   CO2 26 22 - 32 mmol/L   Glucose, Bld 315 (H) 70 - 99 mg/dL   BUN 16 6 - 20 mg/dL   Creatinine, Ser 1.01 0.61 - 1.24 mg/dL   Calcium 9.3 8.9 - 10.3 mg/dL   Total Protein 6.3 (L) 6.5 - 8.1 g/dL   Albumin 3.7 3.5 - 5.0 g/dL   AST 14 (L) 15 - 41 U/L   ALT 22  0 - 44 U/L   Alkaline Phosphatase 72 38 - 126 U/L   Total Bilirubin 0.5 0.3 - 1.2 mg/dL   GFR, Estimated >60 >60 mL/min   Anion gap 8 5 - 15  Urinalysis, Routine w reflex microscopic Urine, Clean Catch  Result Value Ref Range   Color, Urine YELLOW YELLOW   APPearance HAZY (A) CLEAR   Specific Gravity, Urine 1.024 1.005 - 1.030   pH 6.0 5.0 - 8.0   Glucose, UA >=500 (A) NEGATIVE mg/dL   Hgb urine dipstick NEGATIVE NEGATIVE   Bilirubin Urine NEGATIVE NEGATIVE   Ketones, ur NEGATIVE NEGATIVE mg/dL   Protein, ur NEGATIVE NEGATIVE mg/dL   Nitrite NEGATIVE NEGATIVE   Leukocytes,Ua NEGATIVE NEGATIVE   RBC / HPF 0-5 0 - 5 RBC/hpf   WBC, UA 0-5 0 - 5 WBC/hpf   Bacteria, UA NONE SEEN NONE SEEN  Squamous Epithelial / LPF 0-5 0 - 5  Brain natriuretic peptide  Result Value Ref Range   B Natriuretic Peptide 30.3 0.0 - 100.0 pg/mL  CBG monitoring, ED  Result Value Ref Range   Glucose-Capillary 313 (H) 70 - 99 mg/dL  CBG monitoring, ED  Result Value Ref Range   Glucose-Capillary 267 (H) 70 - 99 mg/dL    EKG None  Radiology No results found.  Procedures Procedures   Medications Ordered in ED Medications  oxyCODONE-acetaminophen (PERCOCET/ROXICET) 5-325 MG per tablet 1 tablet (1 tablet Oral Given 12/10/20 0052)    ED Course  I have reviewed the triage vital signs and the nursing notes.  Pertinent labs & imaging results that were available during my care of the patient were reviewed by me and considered in my medical decision making (see chart for details).  Clinical Course as of 12/10/20 0703  Sat Dec 10, 2020  0102 Chronic back pain  H/o DM meth use quit, obesity Progressive weakness in lower L>R, fatigue No fever, CP SOB cough, vomiting abdominal pain Symptoms similar but worse than last hospitalization (extensive work up) CIDP - chronic inflammatory demyel poly Plasmapharesis declined rehab dc home Ambulated to bathroom here with cane without assistance  Hyperglycemia  here A1C 12 [place] - no need plasmapheresis  PT to eval, suspect will recommend discharge   [CG]    Clinical Course User Index [CG] Arlean Hopping   MDM Rules/Calculators/A&P                          Patient to ED with ss/sxs as per HPI.   Chart reviewed. During last admission the patient's symptoms were evaluated by MR cervical, thoracic and lumbar spine, lumbar puncture and plain film imaging. He was felt appropriate for nursing facility placement for rehab prior to returning home but decided against doing this in favor of going home. He returns tonight with similar but lesser symptoms as this admission expressing an interest in pursing rehab placement.   Labs tonight are remarkable for hyperglycemia without acidosis. A1c's are usually elevated around 10.5 - 11.5, indicating poor overall glycemic control. Will continue regular medications.   Patient is observed to ambulate to the bathroom using cane and required minimal assistance.   Discussed the patient's presentation with neurology given dx of CIDP and recent plasmophoresis. He has no electrolyte abnormalities, no respiratory symptoms. He is ambulatory. Plasmophoresis is not felt indicated. Recommended outpatient neurology follow up.   Do not feel medical re-admission is indicated. Will have PT evaluate the patient in the am to determine appropriate disposition. The patient acknowledges he is receiving PT in the home. He is aware that PT evaluation would determine placement for rehab vs discharge home. He understands and is comfortable with plan.   Final Clinical Impression(s) / ED Diagnoses Final diagnoses:  None   1. LE weakness 2. Hyperglycemia 3. History of CIDP  Rx / DC Orders ED Discharge Orders    None       Dennie Bible 12/10/20 [id number]    Veryl Speak, MD 12/10/20 0701    Charlann Lange, PA-C 12/10/20 [phone]

## 2020-12-10 NOTE — ED Triage Notes (Signed)
Patient BIB [facility] for back pain, was recently admitted for same, complaints of back and hip pain and burning, also with increased frequency of urination.

## 2020-12-11 LAB — URINE CULTURE: Culture: NO GROWTH

## 2020-12-12 ENCOUNTER — Encounter: Payer: Self-pay | Admitting: Family Medicine

## 2020-12-12 DIAGNOSIS — M545 Low back pain, unspecified: Secondary | ICD-10-CM

## 2020-12-12 DIAGNOSIS — G8929 Other chronic pain: Secondary | ICD-10-CM

## 2020-12-13 ENCOUNTER — Ambulatory Visit: Payer: Medicare HMO | Admitting: Internal Medicine

## 2020-12-13 ENCOUNTER — Encounter: Payer: Self-pay | Admitting: Internal Medicine

## 2020-12-13 DIAGNOSIS — Z0289 Encounter for other administrative examinations: Secondary | ICD-10-CM

## 2020-12-13 NOTE — Progress Notes (Deleted)
Name: Tyler Brewer  MRN/ DOB: [id number], 10-24-1960   Age/ Sex: 60 y.o., male    PCP: Carollee Herter, Alferd Apa, DO   Reason for Endocrinology Evaluation: Type 2 Diabetes Mellitus     Date of Initial Endocrinology Visit: 12/13/2020     PATIENT IDENTIFIER: Tyler Brewer is a 60 y.o. male with a past medical history of T2DM, Dyslipidemia, CAD, OSA and chronic back pain,chronic inflammatory demyelinating polyneuropathy (CIPD),  . The patient presented for initial endocrinology clinic visit on 12/13/2020 for consultative assistance with his diabetes management.    HPI: [name] was    Diagnosed with DM *** Prior Medications tried/Intolerance: *** Currently checking blood sugars *** x / day,  before breakfast and ***.  Hypoglycemia episodes : ***               Symptoms: ***                 Frequency: ***/  Hemoglobin A1c has ranged from 7.3% in 2017, peaking at 11.5% in 2021. Patient required assistance for hypoglycemia:  Patient has required hospitalization within the last 1 year from hyper or hypoglycemia: no - but had frequent ED visit for weakness, falls and back issues  In terms of diet, the patient ***   HOME DIABETES REGIMEN: Tresiba Novolog   Statin: yes ACE-I/ARB: yes Prior Diabetic Education: {Yes/No:11203}   METER DOWNLOAD SUMMARY: Date range evaluated: *** Fingerstick Blood Glucose Tests = *** Average Number Tests/Day = *** Overall Mean FS Glucose = *** Standard Deviation = ***  BG Ranges: Low = *** High = ***   Hypoglycemic Events/30 Days: BG < 50 = *** Episodes of symptomatic severe hypoglycemia = ***   DIABETIC COMPLICATIONS: Microvascular complications:   Neuropathy  Denies: CKD   Last eye exam: Completed   Macrovascular complications:   ***  Denies: CAD, PVD, CVA   PAST HISTORY: Past Medical History:  Past Medical History:  Diagnosis Date  . Arthritis   . CAD (coronary artery disease)  cardiac cath with moderate disease in a  septal branch of the ramus intermedius 04/01/2012  . Depression   . Diabetes mellitus    poorly controlled by his report  . History of narcotic addiction ([place])    past history of back pain  . Hypercholesteremia   . Hypertension   . IBS (irritable bowel syndrome)   . 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 Neuropathy   . Obesity    Max weight was 390  . OSA on CPAP   . Panic attacks   . Testosterone deficiency   . Vertigo     Past Surgical History:  Past Surgical History:  Procedure Laterality Date  . CARDIAC CATHETERIZATION    . IR FLUORO GUIDE CV LINE RIGHT  11/10/2020  . IR US GUIDE VASC ACCESS RIGHT  11/10/2020  . LEFT HEART CATHETERIZATION WITH CORONARY ANGIOGRAM N/A 03/31/2012   Procedure: LEFT HEART CATHETERIZATION WITH CORONARY ANGIOGRAM;  Surgeon: Leonie Man, MD;  Location: [facility] C[facility] LAB;  Service: Cardiovascular;  Laterality: N/A;      Social History:  reports that he quit smoking about 2 months ago. His smoking use included cigarettes. He smoked 0.50 packs per day. He has never used smokeless tobacco. He reports previous drug use. Drug: Methamphetamines. He reports that he does not drink alcohol. Family History:  Family History  Problem Relation Age of Onset  . Diabetes type II Father   . Hypertension Father   . Pancreatic disease  Father 50       Deceased  . Healthy Mother   . Healthy Sister   . Healthy Son   . Healthy Daughter   . Parkinson's disease Maternal Grandmother       HOME MEDICATIONS: Allergies as of 12/13/2020   No Known Allergies     Medication List       Accurate as of December 13, 2020 11:59 AM. If you have any questions, ask your nurse or doctor.        acetaminophen 500 MG tablet Commonly known as: TYLENOL Take 500 mg by mouth every 6 (six) hours as needed. States he is taking one to two tablets a day   ALLERGY RELIEF PO Take 1 tablet by mouth daily as needed (allergies).   amLODipine 10 MG tablet Commonly known as: NORVASC Take 1  tablet (10 mg total) by mouth daily.   diclofenac Sodium 1 % Gel Commonly known as: Voltaren Apply 2 g topically 4 (four) times daily. What changed:   when to take this  reasons to take this   DULoxetine 30 MG capsule Commonly known as: Cymbalta Take 1 capsule (30 mg total) by mouth daily. What changed: when to take this   furosemide 20 MG tablet Commonly known as: LASIX Take 1 tablet (20 mg total) by mouth daily.   gabapentin 300 MG capsule Commonly known as: Neurontin Take 2 tablets in the morning, 1 tablet in the afternoon, and 1 tablet at bedtime. What changed:   how much to take  how to take this  when to take this  additional instructions   hydrALAZINE 50 MG tablet Commonly known as: APRESOLINE Take 1 tablet (50 mg total) by mouth every 8 (eight) hours.   HYDROcodone-acetaminophen 5-325 MG tablet Commonly known as: NORCO/VICODIN Take 1 tablet by mouth every 4 (four) hours as needed.   ibuprofen 200 MG tablet Commonly known as: ADVIL Take 200 mg by mouth every 6 (six) hours as needed.   insulin aspart 100 UNIT/ML injection Commonly known as: novoLOG Inject 10 Units into the skin 3 (three) times daily with meals.   losartan 100 MG tablet Commonly known as: COZAAR Take 1 tablet (100 mg total) by mouth daily.   meloxicam 7.5 MG tablet Commonly known as: Mobic Take 1 tablet (7.5 mg total) by mouth 2 (two) times daily as needed for pain.   methocarbamol 500 MG tablet Commonly known as: ROBAXIN Take 1 tablet (500 mg total) by mouth 4 (four) times daily. What changed:   when to take this  reasons to take this   pravastatin 40 MG tablet Commonly known as: PRAVACHOL Take 1 tablet (40 mg total) by mouth daily.   tamsulosin 0.4 MG Caps capsule Commonly known as: FLOMAX Take 1 capsule (0.4 mg total) by mouth daily.   tobramycin 0.3 % ophthalmic ointment Commonly known as: TOBREX Place into the left eye 3 (three) times daily.   traMADol 50 MG  tablet Commonly known as: ULTRAM Take 1-2 tablets by mouth every 8 hours as needed for pain.   Tyler Aas FlexTouch 100 UNIT/ML FlexTouch Pen Generic drug: insulin degludec Inject 40 Units into the skin daily.        ALLERGIES: No Known Allergies   REVIEW OF SYSTEMS: A comprehensive ROS was conducted with the patient and is negative except as per HPI and below:  ROS    OBJECTIVE:   VITAL SIGNS: There were no vitals taken for this visit.   PHYSICAL EXAM:  General:  Pt appears well and is in NAD  Hydration: Well-hydrated with moist mucous membranes and good skin turgor  HEENT: Head: Unremarkable with good dentition. Oropharynx clear without exudate.  Eyes: External eye exam normal without stare, lid lag or exophthalmos.  EOM intact.  PERRL.  Neck: General: Supple without adenopathy or carotid bruits. Thyroid: Thyroid size normal.  No goiter or nodules appreciated. No thyroid bruit.  Lungs: Clear with good BS bilat with no rales, rhonchi, or wheezes  Heart: RRR with normal S1 and S2 and no gallops; no murmurs; no rub  Abdomen: Normoactive bowel sounds, soft, nontender, without masses or organomegaly palpable  Extremities:  Lower extremities - No pretibial edema. No lesions.  Skin: Normal texture and temperature to palpation. No rash noted. No Acanthosis nigricans/skin tags. No lipohypertrophy.  Neuro: MS is good with appropriate affect, pt is alert and Ox3    DM foot exam:    DATA REVIEWED:  Lab Results  Component Value Date   HGBA1C 10.6 (H) 11/05/2020   HGBA1C 11.5 (H) 07/05/2020   HGBA1C 11.4 (H) 03/10/2020   Lab Results  Component Value Date   MICROALBUR <0.7 05/02/2017   LDLCALC 52 11/28/2020   CREATININE 1.01 12/10/2020   Lab Results  Component Value Date   MICRALBCREAT 0.9 05/02/2017    Lab Results  Component Value Date   CHOL 118 11/28/2020   HDL 40.00 11/28/2020   LDLCALC 52 11/28/2020   LDLDIRECT 124.0 05/02/2017   TRIG 130.0 11/28/2020    CHOLHDL 3 11/28/2020        ASSESSMENT / PLAN / RECOMMENDATIONS:   1) Type 2 Diabetes Mellitus, ***controlled, With neuropathic  complications - Most recent A1c of *** %. Goal A1c < 7.0 %.   Plan: GENERAL:  ***  MEDICATIONS:  ***  EDUCATION / INSTRUCTIONS:  BG monitoring instructions: Patient is instructed to check his blood sugars *** times a day, ***.  Call [place] [facility] if: BG persistently < 70 or > 300. . I reviewed the Rule of 15 for the treatment of hypoglycemia in detail with the patient. Literature supplied.   2) Diabetic complications:   Eye: Does *** have known diabetic retinopathy.   Neuro/ Feet: Does *** have known diabetic peripheral neuropathy.  Renal: Patient does not have known baseline CKD. He is not on an ACEI/ARB at present.    3) Lipids: Patient is *** on a statin.          Signed electronically by: Mack Guise, MD  [facility] Endocrinology  [facility] [address] [place], [place] [address] Phone: [phone] FAX: [phone]   CC: [address] [place] [address] Phone: [phone]  Fax: [phone]    Return to Endocrinology clinic as below: Future Appointments  Date Time Provider [place]  12/13/2020  1:20 PM Royalti Schauf, Melanie Crazier, MD [facility]  12/15/2020  9:30 AM [facility] SOCIAL [facility]  01/03/2021  1:00 PM [facility] CARE [facility]

## 2020-12-14 NOTE — Telephone Encounter (Signed)
LVM to reschedule.

## 2020-12-15 ENCOUNTER — Telehealth: Payer: Medicare HMO

## 2020-12-15 ENCOUNTER — Telehealth: Payer: Self-pay | Admitting: *Deleted

## 2020-12-15 NOTE — Telephone Encounter (Signed)
  Chronic Care Management   Outreach Note  12/15/2020 Name: Tyler Brewer MRN: [id number] DOB: [birth year]  Referred by: [name], DO Reason for referral : 2nd Unsuccessful Initial Outreach Call. Chronic Care Management for Symptom Management of Diabetes Mellitus, Type 2, Uncontrolled with Neuropathy, Diabetic Polyneuropathy Associated with Diabetes Mellitus and Hyperlipidemia.  An second unsuccessful telephone outreach was attempted today. The patient was referred to the case management team for assistance with care management and care coordination.  A HIPAA compliant message was left on voicemail for patient and LCSW is currently awaiting a return call.  LCSW will make a third outreach call attempt within the next 4-5 business days, if a return call is not received from patient in the meantime.  Follow Up Plan: The patient has been provided with contact information for the care management team and has been advised to call with any health related questions or concerns.  LCSW follow-up on 12/26/2020 at 1:00pm.  Nat Christen LCSW Licensed Clinical Social Worker [place] [phone]

## 2020-12-20 ENCOUNTER — Other Ambulatory Visit: Payer: Self-pay | Admitting: Family Medicine

## 2020-12-20 DIAGNOSIS — G8929 Other chronic pain: Secondary | ICD-10-CM

## 2020-12-20 DIAGNOSIS — R600 Localized edema: Secondary | ICD-10-CM

## 2020-12-20 DIAGNOSIS — M545 Low back pain, unspecified: Secondary | ICD-10-CM

## 2020-12-21 NOTE — Telephone Encounter (Signed)
Requesting: Tramadol Contract: N/A UDS: N/A Last OV: 11/28/2020 Next OV: N/A Last Refill: 11/28/2020, #30--0 RF Database:   Please advise

## 2020-12-23 ENCOUNTER — Encounter: Payer: Self-pay | Admitting: Family Medicine

## 2020-12-24 ENCOUNTER — Other Ambulatory Visit: Payer: Self-pay | Admitting: Family Medicine

## 2020-12-24 DIAGNOSIS — E1169 Type 2 diabetes mellitus with other specified complication: Secondary | ICD-10-CM

## 2020-12-24 DIAGNOSIS — E785 Hyperlipidemia, unspecified: Secondary | ICD-10-CM

## 2020-12-26 ENCOUNTER — Telehealth: Payer: Medicare HMO

## 2020-12-26 ENCOUNTER — Telehealth: Payer: Self-pay | Admitting: *Deleted

## 2020-12-26 NOTE — Telephone Encounter (Cosign Needed)
  Chronic Care Management   Outreach Note  12/26/2020 Name: Tyler Brewer MRN: [id number] DOB: February 22, 1961  Referred by: [name], DO Reason for referral : 3rd Unsuccessful Outreach Call Attempt. Chronic Care Management for Symptom Management of Diabetes Mellitus, Type 2, Uncontrolled with Neuropathy, Diabetic Polyneuropathy Associated with Diabetes Mellitus and Hyperlipidemia.   The patient was referred to the case management team for assistance with care management and care coordination. Third unsuccessful telephone outreach was attempted today.  A HIPAA compliant message was left on voicemail for patient, as LCSW continues to await a return call.  The patient's primary care provider has been notified of our unsuccessful attempts to make contact with the patient. The care management team is pleased to engage with this patient at any time in the future should he be interested in assistance from the care management team.   Follow Up Plan: The patient has been provided with contact information for the care management team and has been advised to call with any health related questions or concerns.  If patient returns call to primary care provider office, please advise to LCSW at # [phone].    [place] Licensed Clinical Social Worker [place] [phone]

## 2020-12-26 NOTE — Telephone Encounter (Signed)
Let pt know that Dr. Posey Pronto will address at his upcoming appt on 5/19.  She is out of the office

## 2020-12-27 NOTE — Telephone Encounter (Signed)
Was there supposed to be pain management referral?

## 2020-12-29 DIAGNOSIS — I10 Essential (primary) hypertension: Secondary | ICD-10-CM | POA: Diagnosis not present

## 2020-12-29 DIAGNOSIS — J849 Interstitial pulmonary disease, unspecified: Secondary | ICD-10-CM | POA: Diagnosis not present

## 2020-12-29 DIAGNOSIS — G4733 Obstructive sleep apnea (adult) (pediatric): Secondary | ICD-10-CM | POA: Diagnosis not present

## 2020-12-29 DIAGNOSIS — E1121 Type 2 diabetes mellitus with diabetic nephropathy: Secondary | ICD-10-CM | POA: Diagnosis not present

## 2020-12-29 DIAGNOSIS — M5441 Lumbago with sciatica, right side: Secondary | ICD-10-CM | POA: Diagnosis not present

## 2020-12-29 DIAGNOSIS — G8929 Other chronic pain: Secondary | ICD-10-CM | POA: Diagnosis not present

## 2020-12-29 DIAGNOSIS — E7800 Pure hypercholesterolemia, unspecified: Secondary | ICD-10-CM | POA: Diagnosis not present

## 2020-12-29 DIAGNOSIS — E1136 Type 2 diabetes mellitus with diabetic cataract: Secondary | ICD-10-CM | POA: Diagnosis not present

## 2020-12-29 DIAGNOSIS — I2510 Atherosclerotic heart disease of native coronary artery without angina pectoris: Secondary | ICD-10-CM | POA: Diagnosis not present

## 2020-12-29 DIAGNOSIS — E1142 Type 2 diabetes mellitus with diabetic polyneuropathy: Secondary | ICD-10-CM | POA: Diagnosis not present

## 2020-12-30 DIAGNOSIS — G8929 Other chronic pain: Secondary | ICD-10-CM | POA: Diagnosis not present

## 2020-12-30 DIAGNOSIS — I2510 Atherosclerotic heart disease of native coronary artery without angina pectoris: Secondary | ICD-10-CM | POA: Diagnosis not present

## 2020-12-30 DIAGNOSIS — E7800 Pure hypercholesterolemia, unspecified: Secondary | ICD-10-CM | POA: Diagnosis not present

## 2020-12-30 DIAGNOSIS — I10 Essential (primary) hypertension: Secondary | ICD-10-CM | POA: Diagnosis not present

## 2020-12-30 DIAGNOSIS — J849 Interstitial pulmonary disease, unspecified: Secondary | ICD-10-CM | POA: Diagnosis not present

## 2020-12-30 DIAGNOSIS — M5441 Lumbago with sciatica, right side: Secondary | ICD-10-CM | POA: Diagnosis not present

## 2020-12-30 DIAGNOSIS — E1142 Type 2 diabetes mellitus with diabetic polyneuropathy: Secondary | ICD-10-CM | POA: Diagnosis not present

## 2020-12-30 DIAGNOSIS — E1121 Type 2 diabetes mellitus with diabetic nephropathy: Secondary | ICD-10-CM | POA: Diagnosis not present

## 2020-12-30 DIAGNOSIS — E1136 Type 2 diabetes mellitus with diabetic cataract: Secondary | ICD-10-CM | POA: Diagnosis not present

## 2020-12-30 DIAGNOSIS — G4733 Obstructive sleep apnea (adult) (pediatric): Secondary | ICD-10-CM | POA: Diagnosis not present

## 2021-01-02 ENCOUNTER — Telehealth: Payer: Self-pay | Admitting: Family Medicine

## 2021-01-02 ENCOUNTER — Ambulatory Visit (INDEPENDENT_AMBULATORY_CARE_PROVIDER_SITE_OTHER): Payer: Medicare HMO

## 2021-01-02 DIAGNOSIS — E1140 Type 2 diabetes mellitus with diabetic neuropathy, unspecified: Secondary | ICD-10-CM

## 2021-01-02 DIAGNOSIS — E1165 Type 2 diabetes mellitus with hyperglycemia: Secondary | ICD-10-CM

## 2021-01-02 DIAGNOSIS — E0842 Diabetes mellitus due to underlying condition with diabetic polyneuropathy: Secondary | ICD-10-CM

## 2021-01-02 DIAGNOSIS — Z794 Long term (current) use of insulin: Secondary | ICD-10-CM

## 2021-01-02 DIAGNOSIS — E785 Hyperlipidemia, unspecified: Secondary | ICD-10-CM

## 2021-01-02 DIAGNOSIS — IMO0002 Reserved for concepts with insufficient information to code with codable children: Secondary | ICD-10-CM

## 2021-01-02 DIAGNOSIS — I10 Essential (primary) hypertension: Secondary | ICD-10-CM | POA: Diagnosis not present

## 2021-01-02 DIAGNOSIS — R29898 Other symptoms and signs involving the musculoskeletal system: Secondary | ICD-10-CM

## 2021-01-02 NOTE — Chronic Care Management (AMB) (Signed)
Chronic Care Management   CCM RN Visit Note  01/02/2021 Name: Tyler Brewer MRN: [id number] DOB: 08/01/1961  Subjective: Tyler Brewer is a 60 y.o. year old male who is a primary care patient of [name], DO. The care management team was consulted for assistance with disease management and care coordination needs.    Engaged with patient by telephone for follow up visit in response to provider referral for case management and/or care coordination services.   Consent to Services:  The patient was given information about Chronic Care Management services, agreed to services, and gave verbal consent prior to initiation of services.  Please see initial visit note for detailed documentation.   Patient agreed to services and verbal consent obtained.   Assessment: Review of patient past medical history, allergies, medications, health status, including review of consultants reports, laboratory and other test data, was performed as part of comprehensive evaluation and provision of chronic care management services.   SDOH (Social Determinants of Health) assessments and interventions performed:  SDOH Interventions   Flowsheet Row Most Recent Value  SDOH Interventions   Financial Strain Interventions Other (Comment)  [Pharmacy referral]  Transportation Interventions Other (Comment)  [referral to care guide]       CCM Care Plan  No Known Allergies  Outpatient Encounter Medications as of 01/02/2021  Medication Sig  . amLODipine (NORVASC) 10 MG tablet Take 1 tablet (10 mg total) by mouth daily.  . Chlorpheniramine Maleate (ALLERGY RELIEF PO) Take 1 tablet by mouth daily as needed (allergies).  . DULoxetine (CYMBALTA) 30 MG capsule Take 1 capsule (30 mg total) by mouth daily. (Patient taking differently: Take 30 mg by mouth 3 (three) times a week.)  . furosemide (LASIX) 20 MG tablet TAKE 1 TABLET(20 MG) BY MOUTH DAILY  . gabapentin (NEURONTIN) 300 MG capsule Take 2 tablets in the  morning, 1 tablet in the afternoon, and 1 tablet at bedtime. (Patient taking differently: Take 300-600 mg by mouth See admin instructions. Take 600 mg in the morning, 300 mg in the afternoon and at bedtime)  . hydrALAZINE (APRESOLINE) 50 MG tablet Take 1 tablet (50 mg total) by mouth every 8 (eight) hours.  [name] Kitchen ibuprofen (ADVIL) 200 MG tablet Take 200 mg by mouth every 6 (six) hours as needed.  . insulin aspart (NOVOLOG) 100 UNIT/ML injection Inject 10 Units into the skin 3 (three) times daily with meals.  . insulin degludec (TRESIBA FLEXTOUCH) 100 UNIT/ML FlexTouch Pen Inject 40 Units into the skin daily.  [name] Kitchen losartan (COZAAR) 100 MG tablet Take 1 tablet (100 mg total) by mouth daily.  . methocarbamol (ROBAXIN) 500 MG tablet Take 1 tablet (500 mg total) by mouth 4 (four) times daily. (Patient taking differently: Take 500 mg by mouth every 6 (six) hours as needed for muscle spasms.)  . pravastatin (PRAVACHOL) 40 MG tablet TAKE ONE TABLET BY MOUTH ONE TIME DAILY  . tamsulosin (FLOMAX) 0.4 MG CAPS capsule Take 1 capsule (0.4 mg total) by mouth daily.  . traMADol (ULTRAM) 50 MG tablet TAKE ONE TO TWO TABLETS BY MOUTH EVERY 8 HOURS AS NEEDED FOR PAIN  . acetaminophen (TYLENOL) 500 MG tablet Take 500 mg by mouth every 6 (six) hours as needed. States he is taking one to two tablets a day  . diclofenac Sodium (VOLTAREN) 1 % GEL Apply 2 g topically 4 (four) times daily. (Patient not taking: Reported on 01/02/2021)  . HYDROcodone-acetaminophen (NORCO/VICODIN) 5-325 MG tablet Take 1 tablet by mouth every 4 (four) hours  as needed. (Patient not taking: No sig reported)  . meloxicam (MOBIC) 7.5 MG tablet Take 1 tablet (7.5 mg total) by mouth 2 (two) times daily as needed for pain. (Patient not taking: No sig reported)  . tobramycin (TOBREX) 0.3 % ophthalmic ointment Place into the left eye 3 (three) times daily. (Patient not taking: Reported on 01/02/2021)   No facility-administered encounter medications on file as of  01/02/2021.    Patient Active Problem List   Diagnosis Date Noted  . Lower extremity edema 11/28/2020  . Chronic low back pain 11/28/2020  . Psoriatic arthritis ([place]) 11/28/2020  . Thoracic aortic aneurysm without rupture ([place]) 11/28/2020  . Bilateral leg weakness 11/05/2020  . Acute left-sided low back pain with right-sided sciatica 07/19/2020  . Hip pain, acute, left 07/05/2020  . Tremor 07/05/2020  . Weakness 07/05/2020  . Balance problem 03/11/2020  . Tinea cruris 03/11/2020  . Cellulitis of left groin 03/11/2020  . Acute pain of right shoulder 03/11/2020  . Interstitial lung disease ([place]) 05/02/2017  . Pansinusitis 09/26/2016  . Diabetic polyneuropathy associated with diabetes mellitus due to underlying condition ([place]) 05/08/2016  . Methamphetamine use disorder, severe, dependence ([place]) 02/11/2016  . Substance induced mood disorder ([place]) 02/11/2016  . Exertional dyspnea 11/30/2015  . ADD (attention deficit disorder) 09/29/2015  . Binge eating 09/29/2015  . Syncope 05/05/2012  . Diarrhea 05/05/2012  . Panic attacks 05/05/2012  . OSA (obstructive sleep apnea) 05/05/2012  . HTN (hypertension) 05/05/2012  . Dyslipidemia 05/05/2012  . CAD (coronary artery disease)  cardiac cath with moderate disease in a septal branch of the ramus intermedius 04/01/2012  . Chest pain 04/01/2012  . Drug abuse and dependence ([place]) 04/01/2012  . Family history of coronary artery disease 03/31/2012  . Sleep apnea, on C-pap 03/31/2012  . Hyperlipemia 03/30/2012  . HTN (hypertension), benign 03/30/2012  . Morbid obesity ([place]) 03/30/2012  . DM type 2, uncontrolled, with neuropathy ([place]) 03/30/2012  . Depression with suicidal ideation 03/30/2012    Conditions to be addressed/monitored:DMII and lower extremity weakness/edema  Care Plan : Quality of Life  Updates made by Luretha Rued, RN since 01/02/2021 12:00 AM  Problem: Quality of Life   Priority: Medium  Long-Range Goal: Quality of Life  Maintained/Improved   Start Date: 12/06/2020  Expected End Date: 06/08/2021  This Visit's Progress: On track  Recent Progress: On track  Current Barriers:  [name] Kitchen Knowledge Deficits related to long term care plan to improve/manage quality of life. Client with history of chronic disease, uncontrolled DM. Patient reports a history of falls, weakness, limited mobility. Recent hospitalization 11/04/20-11/21/20 with bilateral weakness and lower extremity edema. Client reports some improvement. He denies any falls, continues to use assistive devices as needed. And continues with home health physical therapy and nursing once a week. Client reports he has an neurology appointment with Dr. Posey Pronto on 01/05/21.  [name] Kitchen Chronic Disease Management support and education needs related to health management . Unable to independently stand for periods of time for house hold chores -support from friends.  . Limited Transportation-reports supportive friends, but not always available to take client to appointments and for pharmacy refills. Nurse Case Manager Clinical Goal(s):  . patient will verbalize understanding of plan for maintenance/improvement of quality of life. Work toward goal of "get back healthy enough to join [employer]" . patient will attend all scheduled medical appointments:  . patient will work with CM clinical social worker to discuss life concerns related to family relationships; limited support. [name] Kitchen  the patient will demonstrate ongoing self health care management ability as evidenced by attending provider visits as scheduled, taking medications as prescribed, contacting provider for new or worsening symptoms Interventions:  . 1:1 collaboration with Carollee Herter, Alferd Apa, DO regarding development and update of comprehensive plan of care as evidenced by provider attestation and co-signature . Inter-disciplinary care team collaboration (see longitudinal plan of care) . Evaluation of current treatment plan  related to patient's health conditions and patient's adherence to plan as established by provider. . Advised patient to call Neurologist appointment to reschedule appointment.  . Encouraged patient to continue therapy sessions with home health as scheduled . Reviewed medications with patient . Reinforced fall prevention strategies . Discussed pain management - encouraged client to continue take medications as prescribed and elevate feet/legs for edema and contact provider if treatment not effective or worsens, and continue to be active and work with physical therapy. . Emergent Referral made to Care Guide to assist with transportation needs. . Referral to [facility] pharmacist for medication delivery options and assist with financial concern regarding any new upcoming medication prescriptions by endocrinologist. . Reviewed scheduled/upcoming provider appointments including: 01/03/21 with Endocrinologist; 01/05/21 with Neurologist; client states he will call to schedule an appointment with primary care provider.  . Discussed Embedded Clinic Social Worker's inability to reach client. Provided Embedded clinic social workers contact number.  . Care coordination with Embedded social worker: informed that client has outreached to provider office for follow up with embedded social worker. Social worker contact number provided to client to outreach to Education officer, museum.  . Discussed plans with patient for ongoing care management follow up and provided patient with direct contact information for care management team  Patient Goals/Self-Care Activities -Attend Neurologist office visit as scheduled. Currently scheduled for 01/05/21. Contact RNCM if you are having transportation prior to appointment date. -Schedule follow up appointment with Primary care provider- around 02/27/21 or sooner if needed. -Take medications as prescribed and follow up with provider if condition is not relieved or worsens. -Continue to  Elevate legs feet when sitting or laying in bed. -Discuss with Clinic pharmacist request for possible medication delivery options and medication assistance needs if any.  -Continue therapy sessions with home health providers and perform exercises provided per their recommendation. -Continue to use assistive devices as recommended and minimize fall risk(eliminate small throw rugs, keep home well lite, remove clutter from [place] worker to discuss life concerns revolving around family relationships.  Follow Up Plan: The patient has been provided with contact information for the care management team and has been advised to call with any health related questions or concerns.  The care management team will reach out to the patient again over the next 30 days.    Care Plan : Diabetes Type 2 in patient with history of Hyperlipidemia  Updates made by Luretha Rued, RN since 01/02/2021 12:00 AM  Problem: Disease Progression   Priority: Medium    Long-Range Goal: Disease Progression Prevented or Minimized   Start Date: 12/06/2020  Expected End Date: 06/08/2021  This Visit's Progress: On track  Recent Progress: On track  Note:   Objective:  Lab Results  Component Value Date   HGBA1C 10.6 (H) 11/05/2020 .   Lab Results  Component Value Date   CREATININE 0.89 11/28/2020   CREATININE 1.11 11/20/2020   CREATININE 0.86 11/19/2020   Current Barriers:  [name] Kitchen Knowledge Deficits related to long term care plan for management of diabetes. Client reports  he has not obtained his glucose meter because he has an $18 co-pay, and will have it next week to get the meter. Client has an appointment with endocrinologist on 01/03/21. He states he is eating better than before: more fruits/vegetables, and sugar free desserts.   . Knowledge deficit re: healthier eating plan . Limited family support, but reports he has supportive friends. Currently staying with friends. . Limited  mobility . Unable to independently stand for activities such as cooking, cleaning-reports still active with home health PT/Nursing . Transportation needs Case Manager Clinical Goal(s):  [name] Kitchen Patient will attend initial visit with Endocrinologist:re-scheduled for 01/03/21 . Patient will obtain glucose meter and begin checking blood sugars. . patient will demonstrate improved adherence to prescribed treatment plan for diabetes self care/management as evidenced by: daily monitoring and recording of CBG  adherence to prescribed medication regimen contacting provider for new or worsened symptoms or questions Interventions:  . Collaboration with [place], DO regarding development and update of comprehensive plan of care as evidenced by provider attestation and co-signature . Inter-disciplinary care team collaboration (see longitudinal plan of care)Reviewed medications with patient and discussed importance of medication adherence . Discussed signs/symptoms of hypo and hyper glycemia . Discussed patient diet . Encouraged client to review and use Calendar/organizer . Reviewed scheduled/upcoming provider appointments including: PCP, neurology, endocrinologist  . Referral made to social work team for assistance with talk therapy-chronic health conditions, relationship with children strained . Review of patient status, including review of relevant laboratory completed . Discussed plans with patient for ongoing care management follow up and provided patient with direct contact information for care management team  Emergent Care Guide referral regarding possible assistance to appointment 01/03/21/transportation needs.  Patient Goals: -attend provider visit as scheduled-currently scheduled for 01/03/21. -obtain glucose meter -take medications as prescribed. -check blood sugar as recommended by provider -know blood sugar target range and when to call doctor -know target Hemoglobin A1C -enter blood  sugar readings and medication or insulin into daily log and take to provider appointments - take the blood sugar meter to all doctor visits once you have begun using - limit fast food meals to no more than 1 per week - schedule appointment with eye doctor -Call [facility] if you have any questions regarding use of calendar/organizer.  -Discuss Nutrition consult with provider at next visit  Follow Up Plan: The patient has been provided with contact information for the care management team and has been advised to call with any health related questions or concerns.  The care management team will reach out to the patient again over the next 30 days.      Plan:Telephone follow up appointment with care management team member scheduled for:  01/30/21 and The patient has been provided with contact information for the care management team and has been advised to call with any health related questions or concerns.   Thea Silversmith, RN, MSN, BSN, CCM Care Management Coordinator [facility] [phone]

## 2021-01-02 NOTE — Telephone Encounter (Signed)
   Telephone encounter was:  Successful.  01/02/2021 Name: Tailor Westfall MRN: [id number] DOB: 06/21/1961  Klay Sobotka is a 60 y.o. year old male who is a primary care patient of [name], DO . The community resource team was consulted for assistance with Transportation Needs   Care guide performed the following interventions: Patient provided with information about care guide support team and interviewed to confirm resource needs Discussed resources to assist with transportation for his two upcoming doctors appointments. He said that in the past he has gotten a lift from his friends but they have all gone back to work. I emailed him information on using his insurance benefits as well as the forms and information on ACCESS and TAMS so that the doctor's office can print and fill out with pt at appointment Placed referral to [place] via form through email. .  Follow Up Plan:  No further follow up planned at this time. The patient has been provided with needed resources.  [place], Care Management Phone: [phone] Email: [email or web address]

## 2021-01-02 NOTE — Patient Instructions (Addendum)
Visit Information: Thank you for taking the time to speak with me today.  Goals Addressed              This Visit's Progress   .  improve quality of life-get back healthy enough to join [employer] (pt-stated)   On track     Timeframe:  Long-Range Goal Priority:  Medium Start Date:  12/06/20                           Expected End Date: 06/07/21                     Follow Up Date 01/30/21  -Attend Neurologist office visit as scheduled. Currently scheduled for 01/05/21. Contact RNCM if you are having transportation prior to appointment date. -Schedule follow up appointment with Primary care provider- around 02/27/21 or sooner if needed. -Take medications as prescribed and follow up with provider if condition is not relieved or worsens. Continue to Elevate legs feet when sitting or laying in bed. Discuss with Clinic pharmacist request for possible medication delivery options and medication assistance needs if any.  Continue therapy sessions with home health providers and perform exercises provided per their recommendation. Continue to use assistive devices as recommended and minimize fall risk(eliminate small throw rugs, keep home well lite, remove clutter from walkways. Contact Clinic Social worker to discuss life concerns revolving around family relationships.     Why is this important?    Having a long-term illness can be scary.   It can also be stressful for you and your caregiver.   These steps may help.    Notes:     [name] Kitchen  Monitor and Manage My Blood Sugar   On track     Timeframe:  Long-Range Goal Priority:  Medium Start Date:  12/06/20                           Expected End Date: 06/07/21                      Follow Up Date 01/30/21  -attend provider visit as scheduled-currently scheduled for 01/03/21. -obtain glucose meter -take medications as prescribed. -check blood sugar as recommended by provider -know blood sugar target range and when to call doctor -know target  Hemoglobin A1C -enter blood sugar readings and medication or insulin into daily log and take to provider appointments - take the blood sugar meter to all doctor visits once you have begun using - limit fast food meals to no more than 1 per week - schedule appointment with eye doctor -Call [facility] if you have any questions regarding use of calendar/organizer.  -Discuss Nutrition consult with provider at next visit   Why is this important?    Checking your blood sugar at home helps to keep it from getting very high or very low.   Writing the results in a diary or log helps the doctor know how to care for you.   Your blood sugar log should have the time, date and the results.   Also, write down the amount of insulin or other medicine that you take.   Other information, like what you ate, exercise done and how you were feeling, will also be helpful.     Notes:        Patient verbalizes understanding of instructions provided today and agrees to view in [place].  Telephone follow up appointment with care management team member scheduled for: 01/30/21 The patient has been provided with contact information for the care management team and has been advised to call with any health related questions or concerns.   Thea Silversmith, RN, MSN, BSN, CCM Care Management Coordinator [facility] [phone]

## 2021-01-03 ENCOUNTER — Emergency Department (HOSPITAL_BASED_OUTPATIENT_CLINIC_OR_DEPARTMENT_OTHER): Payer: Medicare HMO

## 2021-01-03 ENCOUNTER — Emergency Department (HOSPITAL_BASED_OUTPATIENT_CLINIC_OR_DEPARTMENT_OTHER)
Admission: EM | Admit: 2021-01-03 | Discharge: 2021-01-04 | Disposition: A | Discharge status: Home / Self Care | Payer: Medicare HMO | Attending: Emergency Medicine | Admitting: Emergency Medicine

## 2021-01-03 ENCOUNTER — Telehealth: Payer: Medicare HMO

## 2021-01-03 ENCOUNTER — Other Ambulatory Visit: Payer: Self-pay

## 2021-01-03 ENCOUNTER — Encounter: Payer: Self-pay | Admitting: Internal Medicine

## 2021-01-03 ENCOUNTER — Ambulatory Visit (INDEPENDENT_AMBULATORY_CARE_PROVIDER_SITE_OTHER): Payer: Medicare HMO | Admitting: Internal Medicine

## 2021-01-03 VITALS — BP 148/92 | HR 90 | Ht 71.0 in | Wt 278.5 lb

## 2021-01-03 DIAGNOSIS — E119 Type 2 diabetes mellitus without complications: Secondary | ICD-10-CM | POA: Insufficient documentation

## 2021-01-03 DIAGNOSIS — J80 Acute respiratory distress syndrome: Secondary | ICD-10-CM | POA: Diagnosis not present

## 2021-01-03 DIAGNOSIS — I2510 Atherosclerotic heart disease of native coronary artery without angina pectoris: Secondary | ICD-10-CM | POA: Insufficient documentation

## 2021-01-03 DIAGNOSIS — E1159 Type 2 diabetes mellitus with other circulatory complications: Secondary | ICD-10-CM | POA: Diagnosis not present

## 2021-01-03 DIAGNOSIS — W19XXXA Unspecified fall, initial encounter: Secondary | ICD-10-CM | POA: Diagnosis not present

## 2021-01-03 DIAGNOSIS — N200 Calculus of kidney: Secondary | ICD-10-CM | POA: Diagnosis not present

## 2021-01-03 DIAGNOSIS — F17210 Nicotine dependence, cigarettes, uncomplicated: Secondary | ICD-10-CM | POA: Insufficient documentation

## 2021-01-03 DIAGNOSIS — R55 Syncope and collapse: Secondary | ICD-10-CM | POA: Insufficient documentation

## 2021-01-03 DIAGNOSIS — I6381 Other cerebral infarction due to occlusion or stenosis of small artery: Secondary | ICD-10-CM | POA: Diagnosis not present

## 2021-01-03 DIAGNOSIS — R739 Hyperglycemia, unspecified: Secondary | ICD-10-CM | POA: Diagnosis not present

## 2021-01-03 DIAGNOSIS — S0990XA Unspecified injury of head, initial encounter: Secondary | ICD-10-CM | POA: Diagnosis not present

## 2021-01-03 DIAGNOSIS — E1165 Type 2 diabetes mellitus with hyperglycemia: Secondary | ICD-10-CM | POA: Insufficient documentation

## 2021-01-03 DIAGNOSIS — I10 Essential (primary) hypertension: Secondary | ICD-10-CM | POA: Insufficient documentation

## 2021-01-03 DIAGNOSIS — R42 Dizziness and giddiness: Secondary | ICD-10-CM | POA: Diagnosis not present

## 2021-01-03 DIAGNOSIS — I6523 Occlusion and stenosis of bilateral carotid arteries: Secondary | ICD-10-CM | POA: Diagnosis not present

## 2021-01-03 DIAGNOSIS — S199XXA Unspecified injury of neck, initial encounter: Secondary | ICD-10-CM | POA: Diagnosis not present

## 2021-01-03 DIAGNOSIS — K449 Diaphragmatic hernia without obstruction or gangrene: Secondary | ICD-10-CM | POA: Diagnosis not present

## 2021-01-03 DIAGNOSIS — Z743 Need for continuous supervision: Secondary | ICD-10-CM | POA: Diagnosis not present

## 2021-01-03 DIAGNOSIS — K753 Granulomatous hepatitis, not elsewhere classified: Secondary | ICD-10-CM | POA: Diagnosis not present

## 2021-01-03 DIAGNOSIS — R69 Illness, unspecified: Secondary | ICD-10-CM | POA: Diagnosis not present

## 2021-01-03 DIAGNOSIS — Z794 Long term (current) use of insulin: Secondary | ICD-10-CM | POA: Diagnosis not present

## 2021-01-03 DIAGNOSIS — K7689 Other specified diseases of liver: Secondary | ICD-10-CM | POA: Diagnosis not present

## 2021-01-03 DIAGNOSIS — IMO0002 Reserved for concepts with insufficient information to code with codable children: Secondary | ICD-10-CM

## 2021-01-03 DIAGNOSIS — Z20822 Contact with and (suspected) exposure to covid-19: Secondary | ICD-10-CM | POA: Insufficient documentation

## 2021-01-03 DIAGNOSIS — E785 Hyperlipidemia, unspecified: Secondary | ICD-10-CM | POA: Diagnosis not present

## 2021-01-03 DIAGNOSIS — E1140 Type 2 diabetes mellitus with diabetic neuropathy, unspecified: Secondary | ICD-10-CM

## 2021-01-03 DIAGNOSIS — M47812 Spondylosis without myelopathy or radiculopathy, cervical region: Secondary | ICD-10-CM | POA: Diagnosis not present

## 2021-01-03 DIAGNOSIS — R1030 Lower abdominal pain, unspecified: Secondary | ICD-10-CM | POA: Diagnosis not present

## 2021-01-03 DIAGNOSIS — Z043 Encounter for examination and observation following other accident: Secondary | ICD-10-CM | POA: Diagnosis not present

## 2021-01-03 LAB — POCT GLYCOSYLATED HEMOGLOBIN (HGB A1C): Hemoglobin A1C: 9.9 % — AB (ref 4.0–5.6)

## 2021-01-03 LAB — POCT GLUCOSE (DEVICE FOR HOME USE): POC Glucose: 353 mg/dl — AB (ref 70–99)

## 2021-01-03 IMAGING — CT CT HEAD W/O CM
3 of 4 series · 14 of 47 positions shown, 16 images · non-contrast
Comparison: [DATE] .

CLINICAL DATA: Syncopal episode with fall and head trauma

EXAM:
CT HEAD WITHOUT CONTRAST
CT CERVICAL SPINE WITHOUT CONTRAST
TECHNIQUE: Multidetector CT imaging of the head and cervical spine was
performed following the standard protocol without intravenous
contrast. Multiplanar CT image reconstructions of the cervical spine
were also generated.

[Series 4: head 2.0 h70h · axial · 0.47mm/px · z∈[+1036,+1176]mm · 8 of 88 slices shown, 10 images]
[im 9/88  brain]
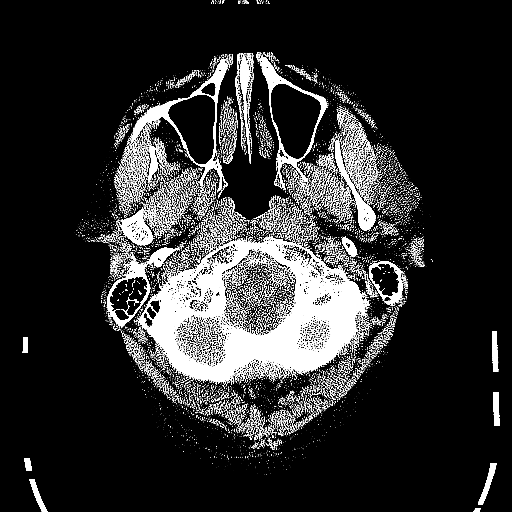
[im 9/88  bone]
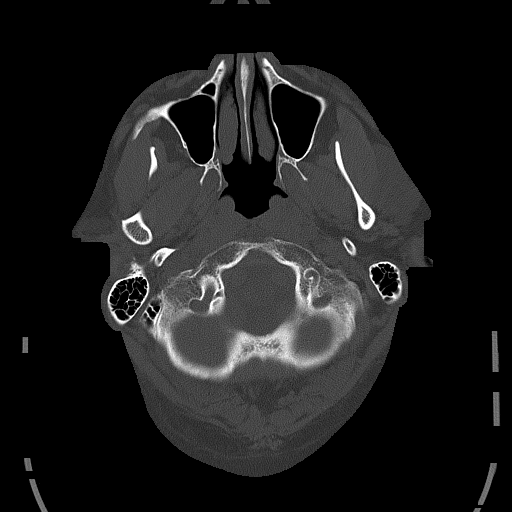
[im 18/88  brain]
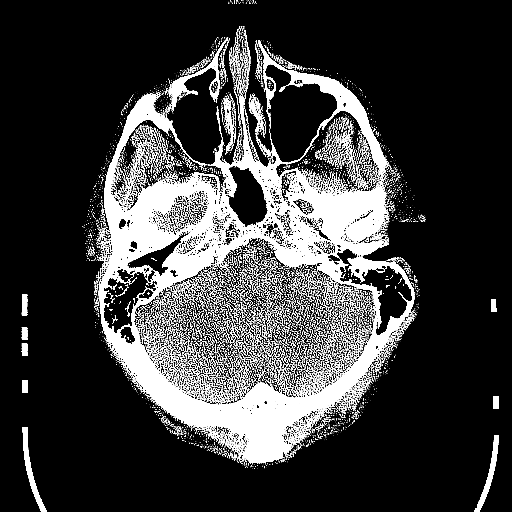
[im 27/88  brain]
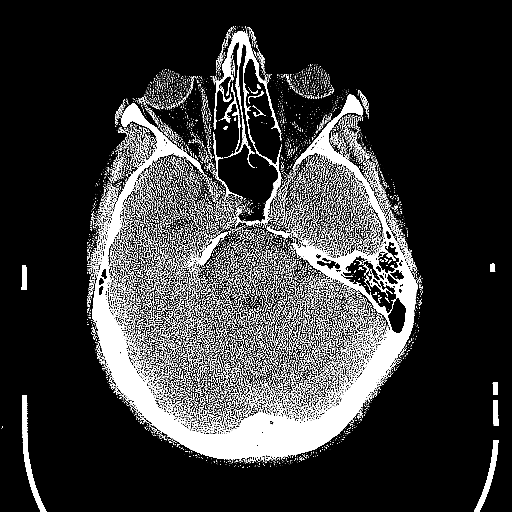
[im 40/88  brain]
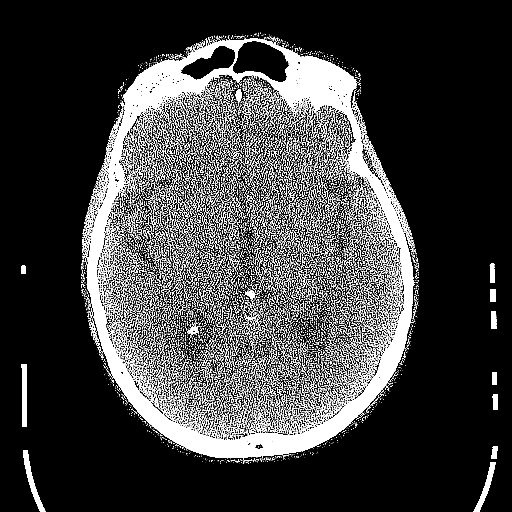
[im 48/88  brain]
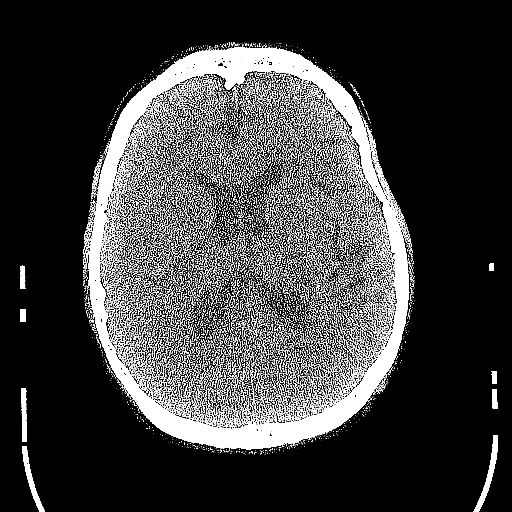
[im 48/88  bone]
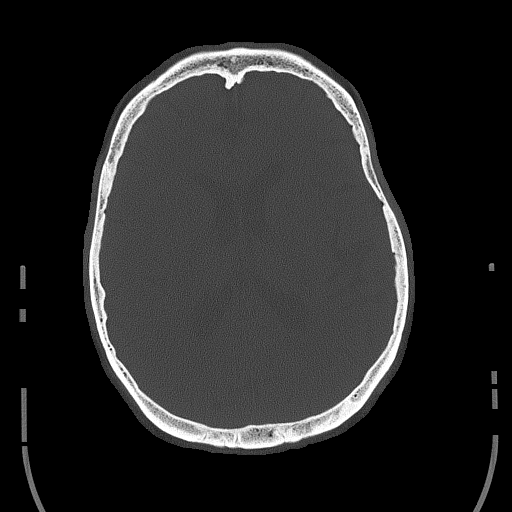
[im 61/88  brain]
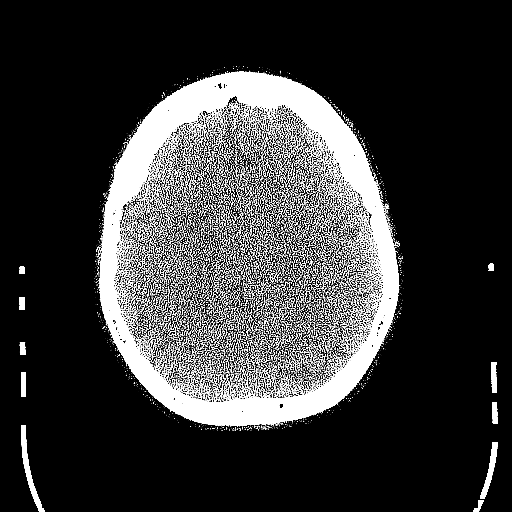
[im 70/88  brain]
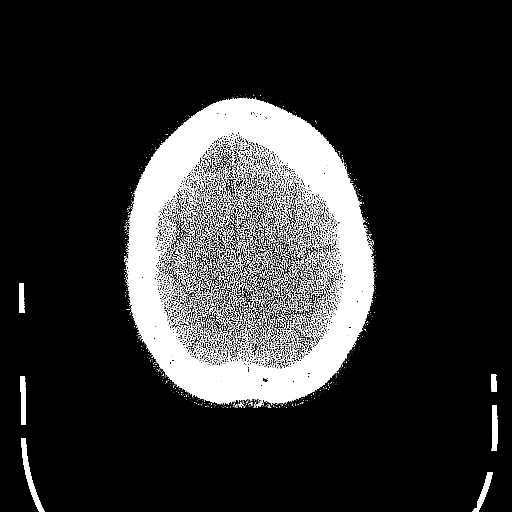
[im 79/88  brain]
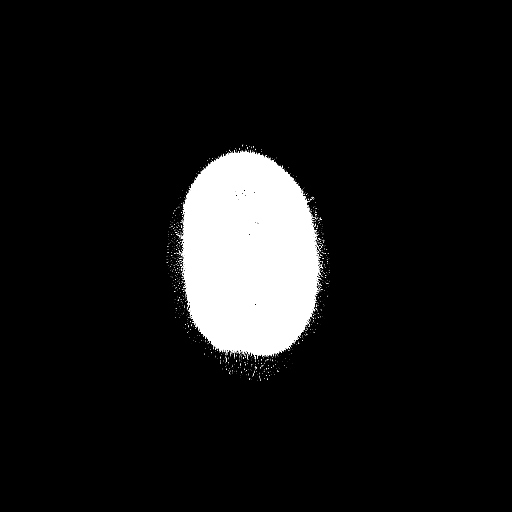

[Series 5: head 3.0 mpr cor · coronal · 0.36mm/px · 3 of 78 slices shown]
[im 26/78  brain]
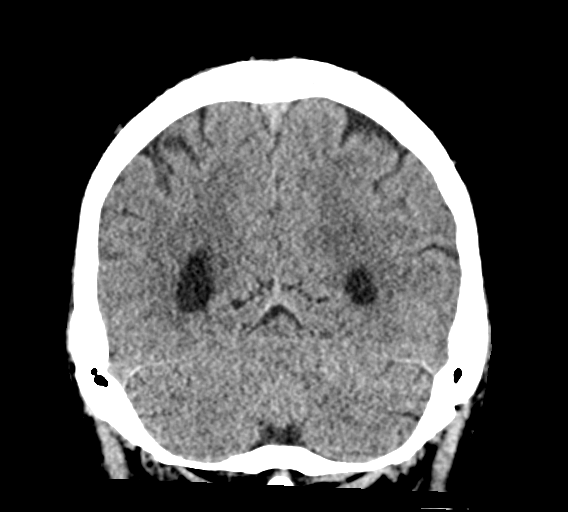
[im 35/78  brain]
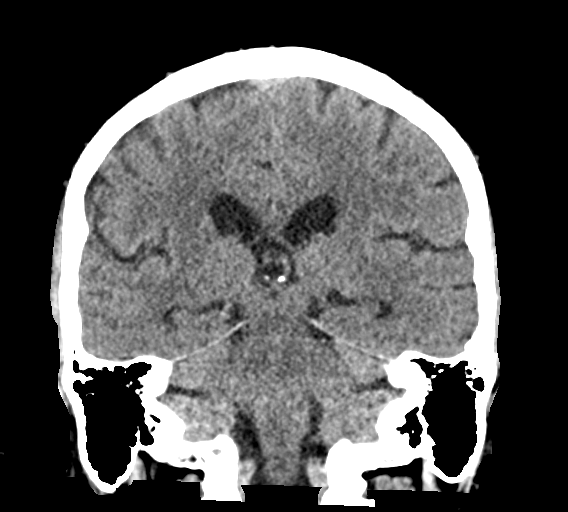
[im 43/78  brain]
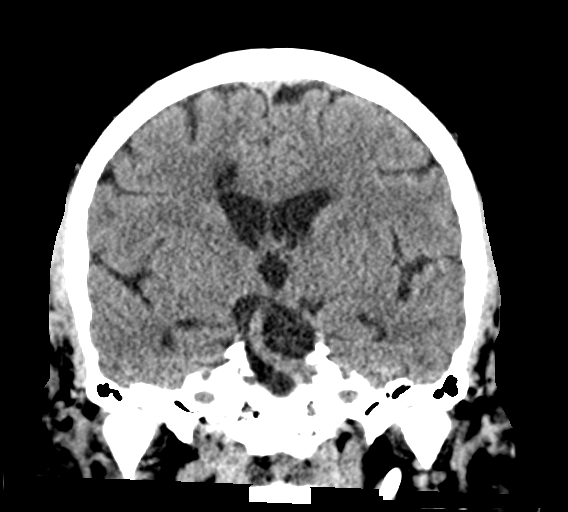

[Series 6: head 3.0 mpr sag · sagittal · 0.35mm/px · 3 of 67 slices shown]
[im 23/67  brain]
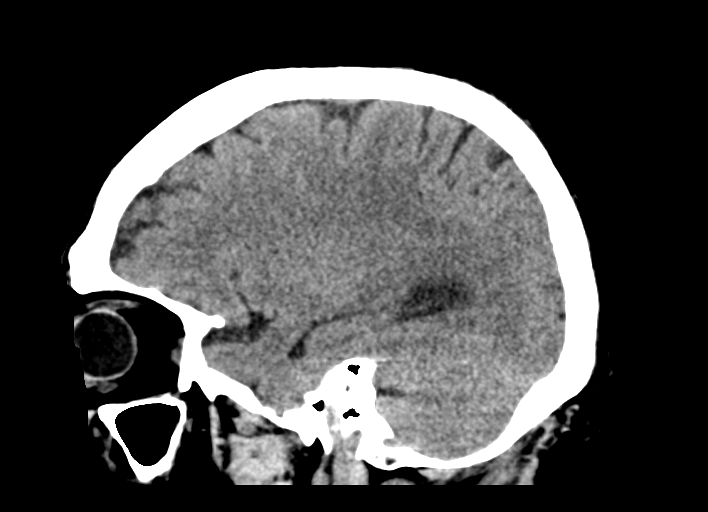
[im 34/67  brain]
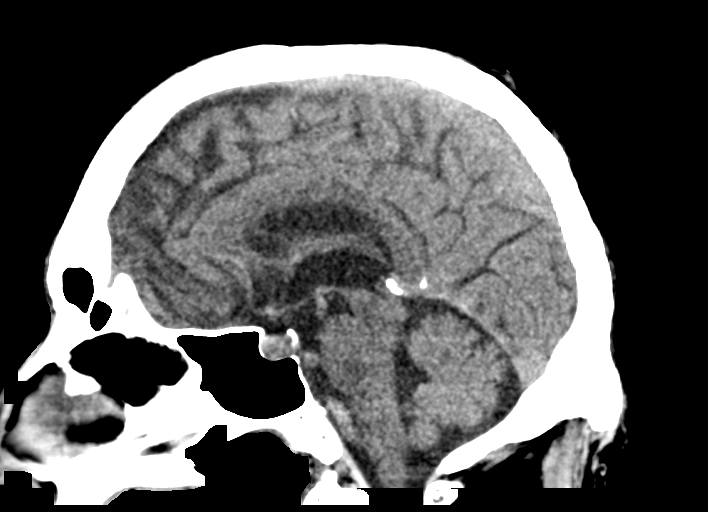
[im 45/67  brain]
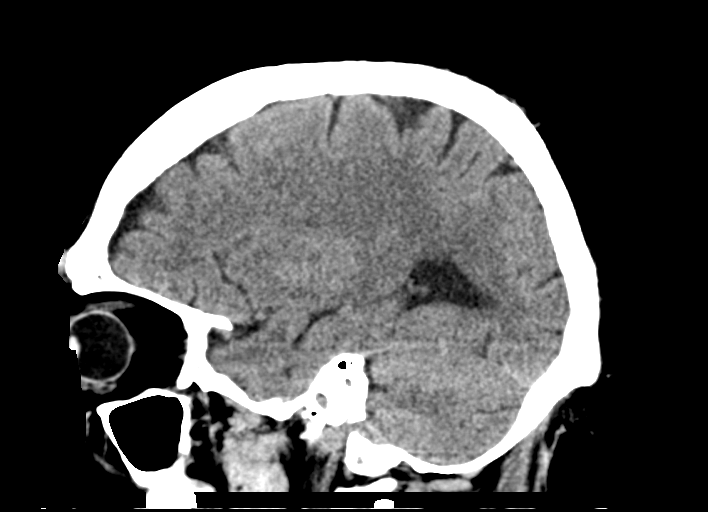

[14 of 47 positions shown; findings below may reference images not displayed]

FINDINGS: CT HEAD FINDINGS

Brain: No evidence of acute large vascular territory infarction,
hemorrhage, hydrocephalus, extra-axial collection or mass
lesion/mass effect. Stable burden of mild chronic microvascular
ischemic disease including the lacunar type infarct within the right
frontal lobe periventricular white matter. Mild age related global
parenchymal volume loss.

Vascular: No hyperdense vessel. Atherosclerotic calcifications of
the internal carotid arteries at skull base.

Skull: Negative for fracture or focal lesion.

Sinuses/Orbits: The visualized paranasal sinuses and mastoid air
cells are predominantly clear. Orbits are grossly unremarkable.

Other: None

CT CERVICAL SPINE FINDINGS

Alignment: Similar straightening of the normal cervical lordosis. No
evidence of traumatic listhesis.

Skull base and vertebrae: No acute fracture. No primary bone lesion
or focal pathologic process.

Soft tissues and spinal canal: No prevertebral fluid or swelling. No
visible canal hematoma.

Disc levels: Mild disc space narrowing and endplate spurring from
C4-C7.

Upper chest: Negative

Other: None
IMPRESSION: 1. No evidence of acute intracranial process.
2. No evidence of acute fracture or traumatic listhesis of the
cervical spine.
3. Stable burden of mild chronic microvascular ischemic disease and
age related global parenchymal volume loss.
4. Mild lower cervical spondylosis.

## 2021-01-03 IMAGING — CT CT CERVICAL SPINE W/O CM
3 of 4 series · 13 of 33 positions shown, 16 images · non-contrast
Comparison: [DATE] .

CLINICAL DATA: Syncopal episode with fall and head trauma

EXAM:
CT HEAD WITHOUT CONTRAST
CT CERVICAL SPINE WITHOUT CONTRAST
TECHNIQUE: Multidetector CT imaging of the head and cervical spine was
performed following the standard protocol without intravenous
contrast. Multiplanar CT image reconstructions of the cervical spine
were also generated.

[Series 4: c_spine 2.0 i30s 3 · axial · 0.54mm/px · z∈[+897,+1029]mm · 5 of 100 slices shown, 7 images]
[im 17/100  soft-tissue]
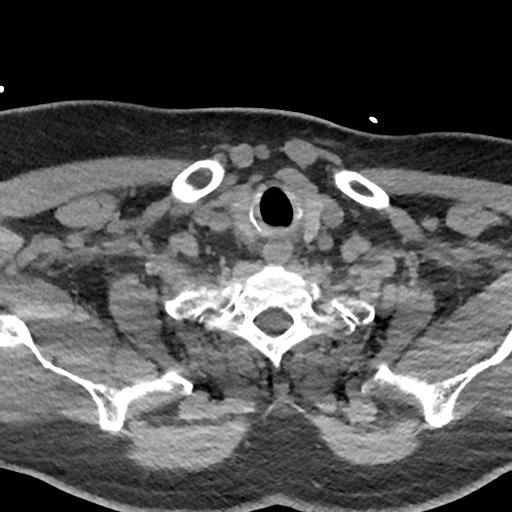
[im 17/100  bone]
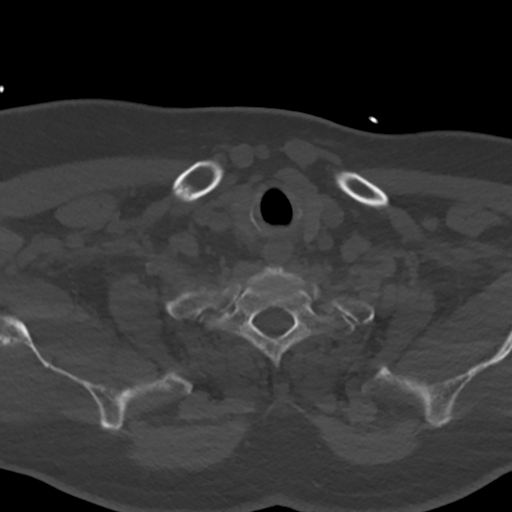
[im 34/100  bone]
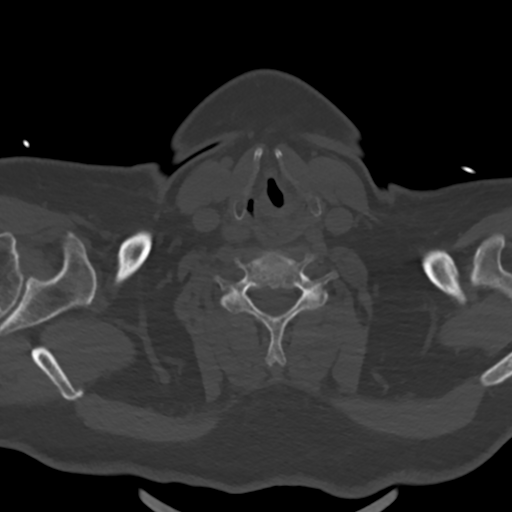
[im 50/100  bone]
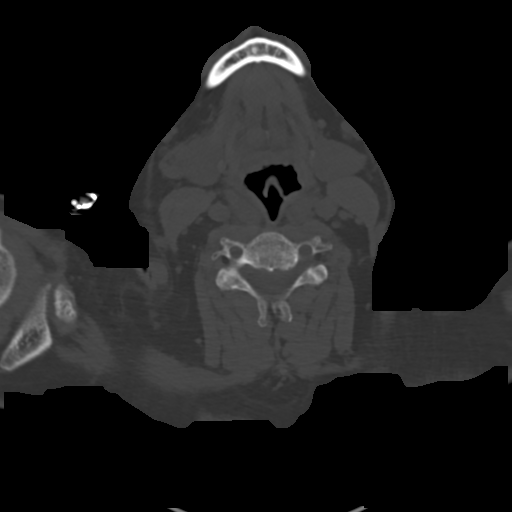
[im 67/100  bone]
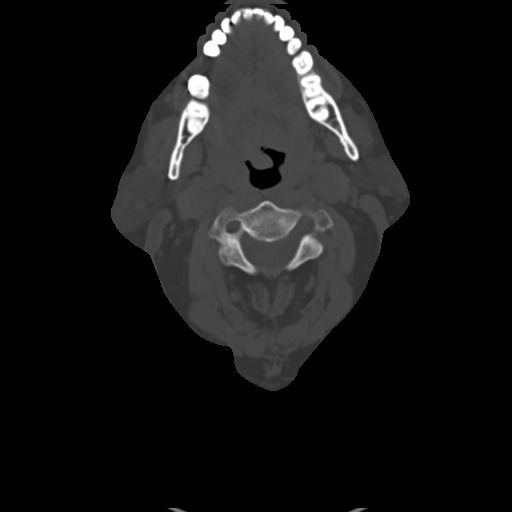
[im 83/100  soft-tissue]
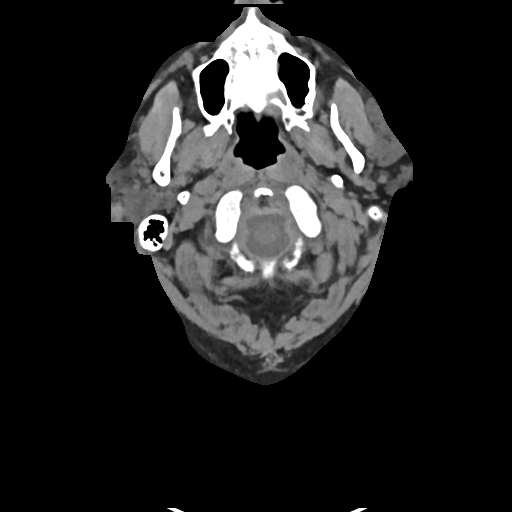
[im 83/100  bone]
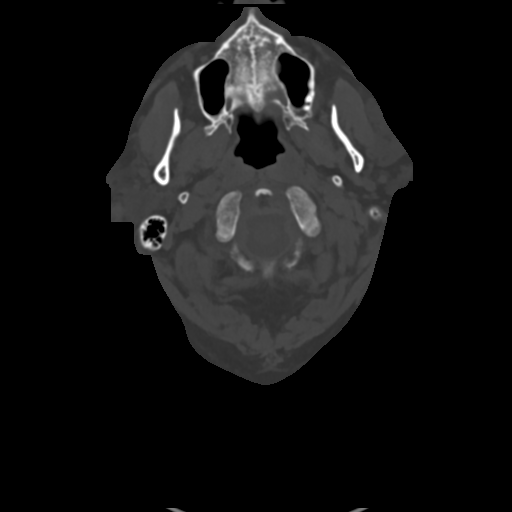

[Series 6: coronals · coronal · 0.29mm/px · 3 of 61 slices shown]
[im 13/61  bone]
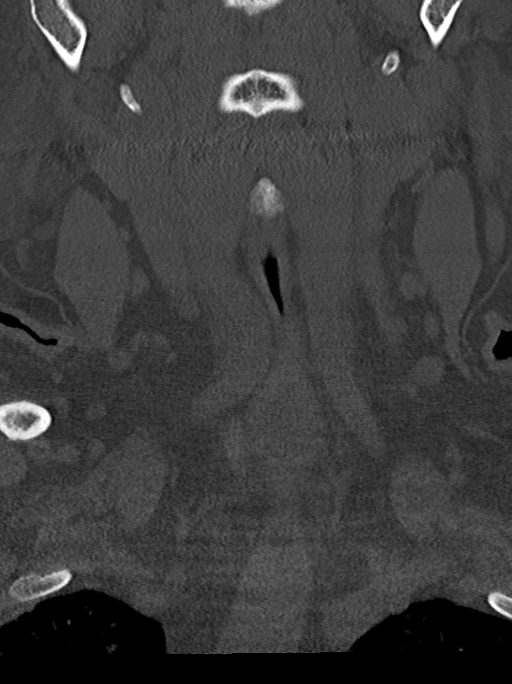
[im 25/61  bone]
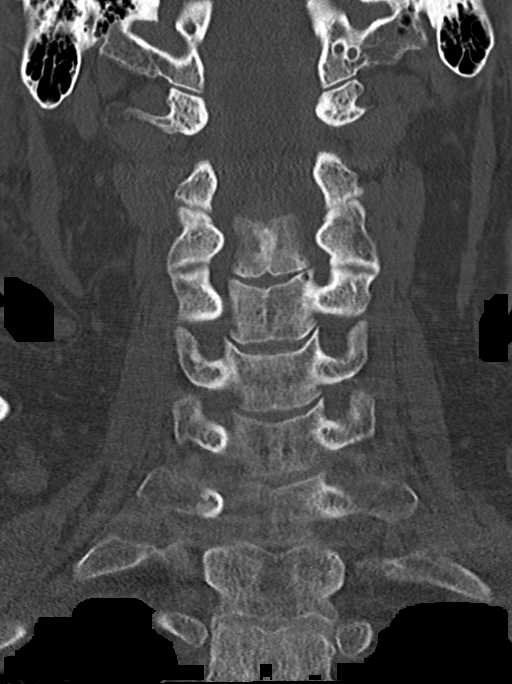
[im 37/61  bone]
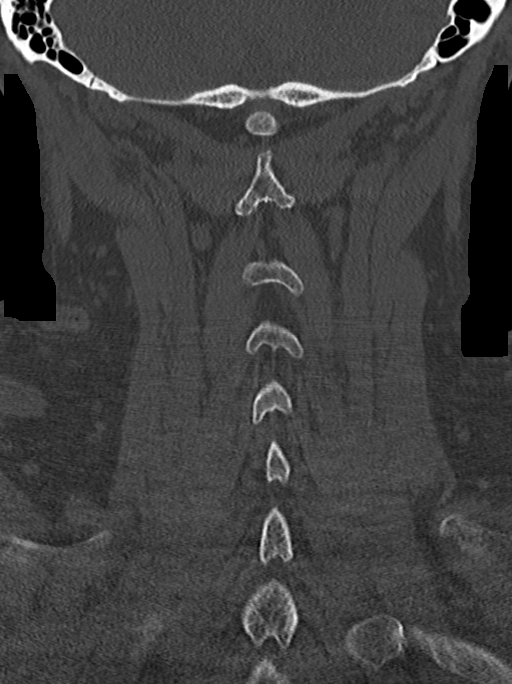

[Series 7: sagittals · sagittal · 0.29mm/px · 5 of 61 slices shown, 6 images]
[im 21/61  bone]
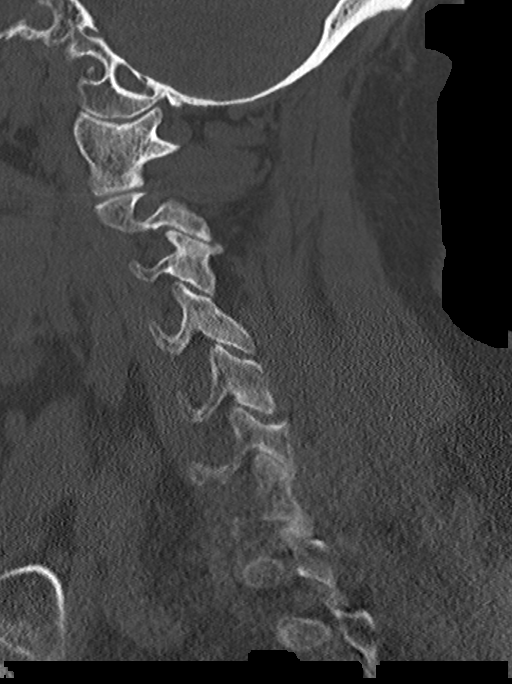
[im 26/61  bone]
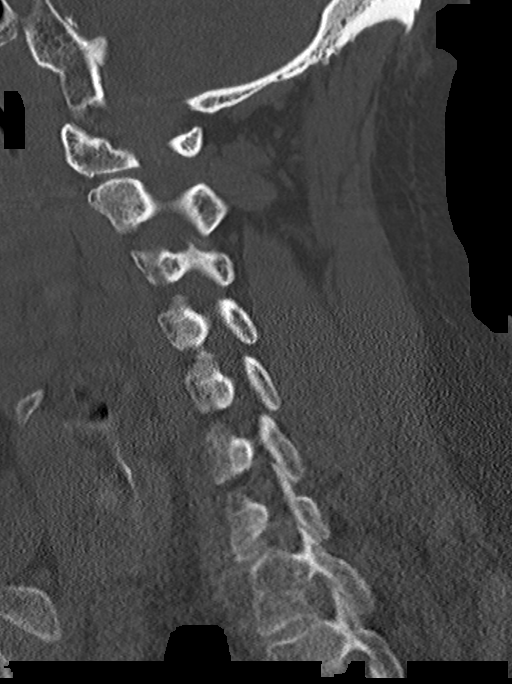
[im 31/61  soft-tissue]
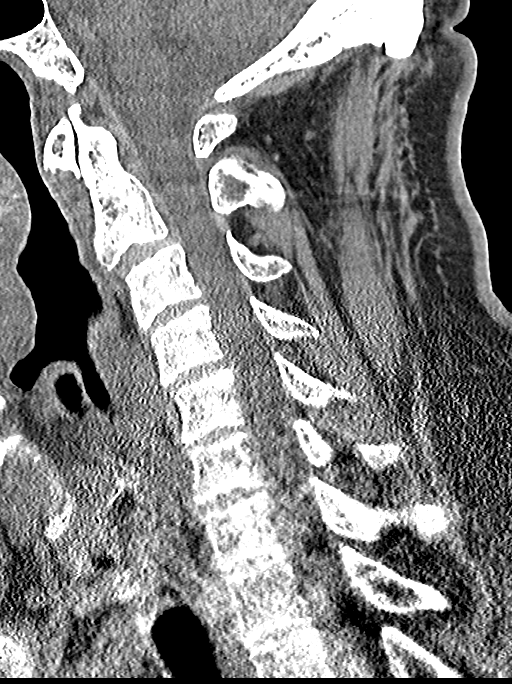
[im 31/61  bone]
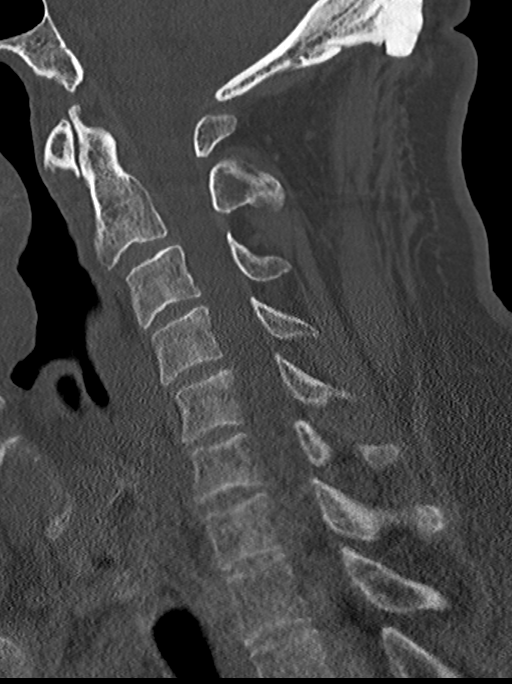
[im 36/61  bone]
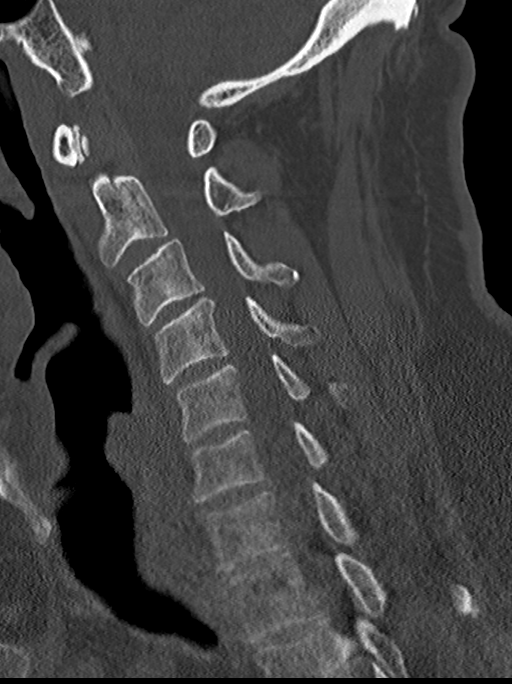
[im 41/61  bone]
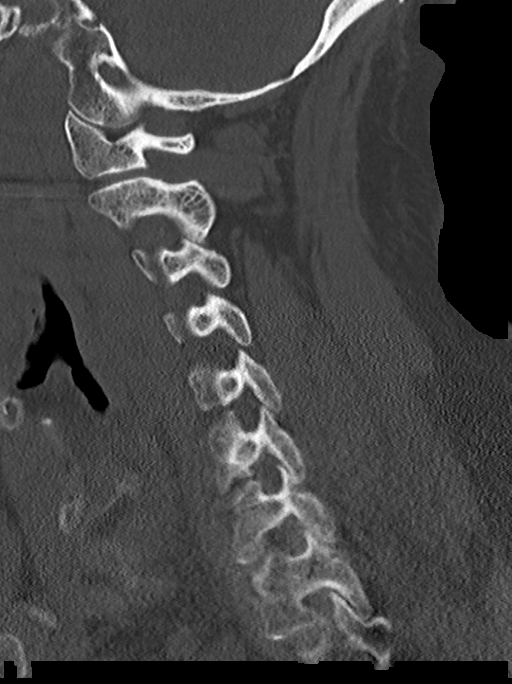

[13 of 33 positions shown; findings below may reference images not displayed]

FINDINGS: CT HEAD FINDINGS

Brain: No evidence of acute large vascular territory infarction,
hemorrhage, hydrocephalus, extra-axial collection or mass
lesion/mass effect. Stable burden of mild chronic microvascular
ischemic disease including the lacunar type infarct within the right
frontal lobe periventricular white matter. Mild age related global
parenchymal volume loss.

Vascular: No hyperdense vessel. Atherosclerotic calcifications of
the internal carotid arteries at skull base.

Skull: Negative for fracture or focal lesion.

Sinuses/Orbits: The visualized paranasal sinuses and mastoid air
cells are predominantly clear. Orbits are grossly unremarkable.

Other: None

CT CERVICAL SPINE FINDINGS

Alignment: Similar straightening of the normal cervical lordosis. No
evidence of traumatic listhesis.

Skull base and vertebrae: No acute fracture. No primary bone lesion
or focal pathologic process.

Soft tissues and spinal canal: No prevertebral fluid or swelling. No
visible canal hematoma.

Disc levels: Mild disc space narrowing and endplate spurring from
C4-C7.

Upper chest: Negative

Other: None
IMPRESSION: 1. No evidence of acute intracranial process.
2. No evidence of acute fracture or traumatic listhesis of the
cervical spine.
3. Stable burden of mild chronic microvascular ischemic disease and
age related global parenchymal volume loss.
4. Mild lower cervical spondylosis.

## 2021-01-03 IMAGING — DX DG CHEST 1V PORT
1 series · 1 of 1 positions shown · non-contrast
Comparison: [DATE]

CLINICAL DATA: Fall

EXAM:
PORTABLE CHEST 1 VIEW

[chest ap]
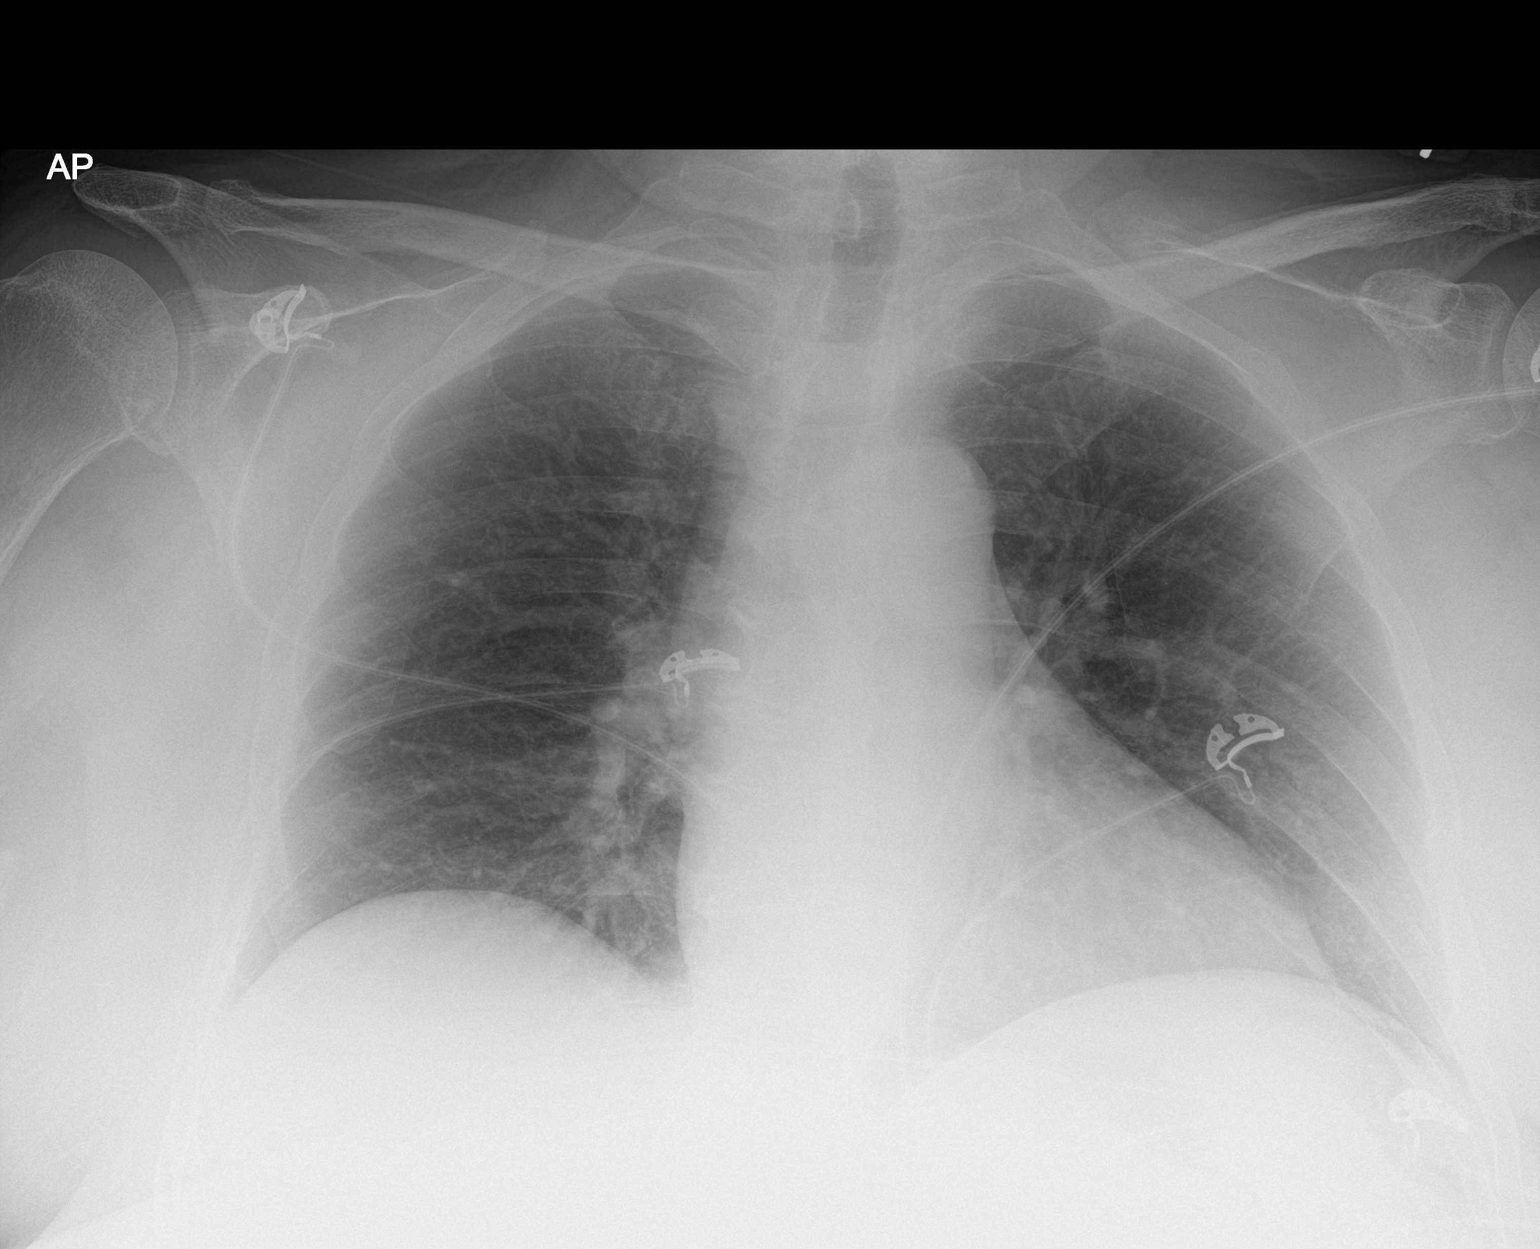

[1 of 1 positions shown; findings below may reference images not displayed]

FINDINGS: The heart size and mediastinal contours are within normal limits.
Both lungs are clear. The visualized skeletal structures are
unremarkable.
IMPRESSION: No active disease.

## 2021-01-03 IMAGING — CT CT ABD-PELV W/O CM
2 of 4 series · 16 of 46 positions shown, 18 images · non-contrast
Comparison: [DATE]

CLINICAL DATA: Four days of right-sided groin and back pain

EXAM:
CT ABDOMEN AND PELVIS WITHOUT CONTRAST
TECHNIQUE: Multidetector CT imaging of the abdomen and pelvis was performed
following the standard protocol without IV contrast.

[Series 3: axial st · axial · 0.98mm/px · z∈[+232,+762]mm · 13 of 116 slices shown, 15 images]
[im 5/116  soft-tissue]
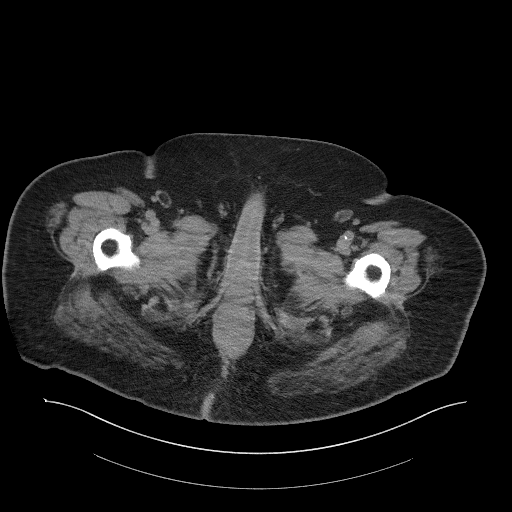
[im 5/116  bone]
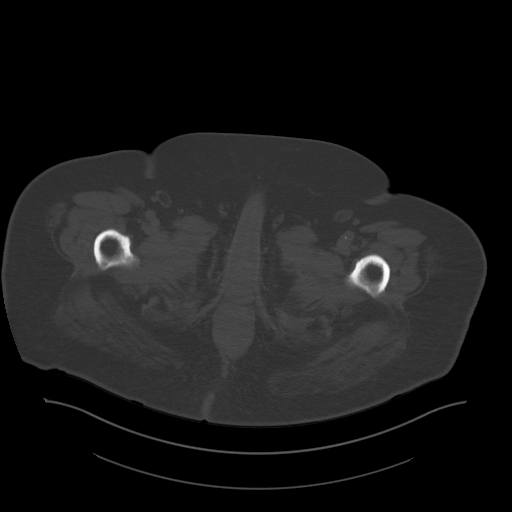
[im 15/116  soft-tissue]
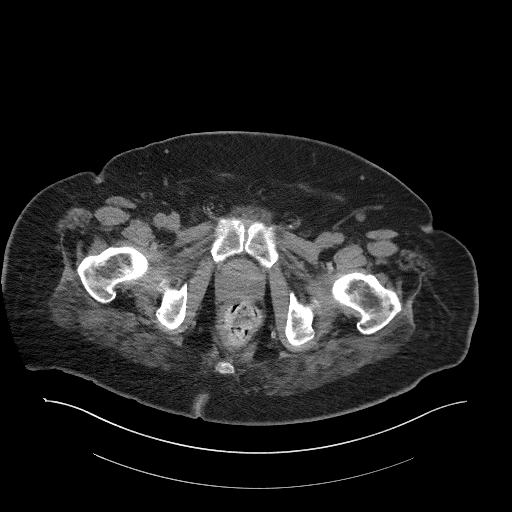
[im 24/116  soft-tissue]
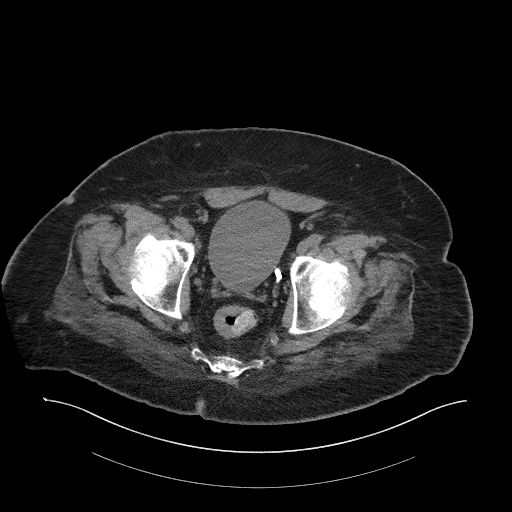
[im 34/116  soft-tissue]
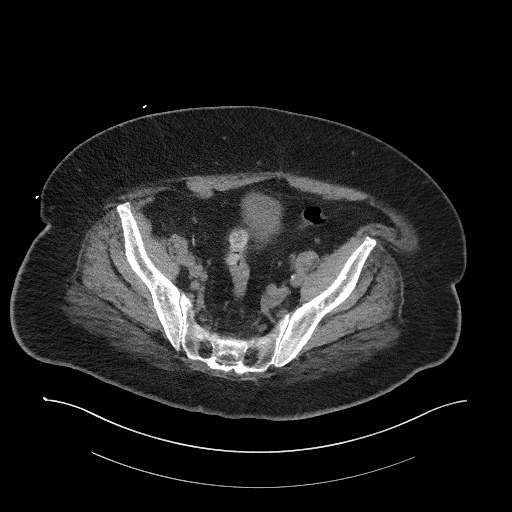
[im 39/116  soft-tissue]
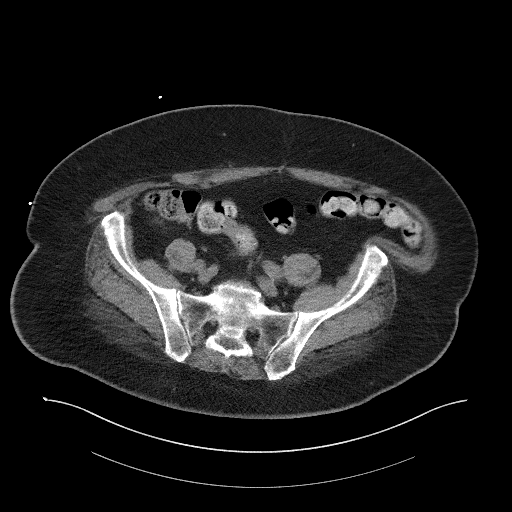
[im 48/116  soft-tissue]
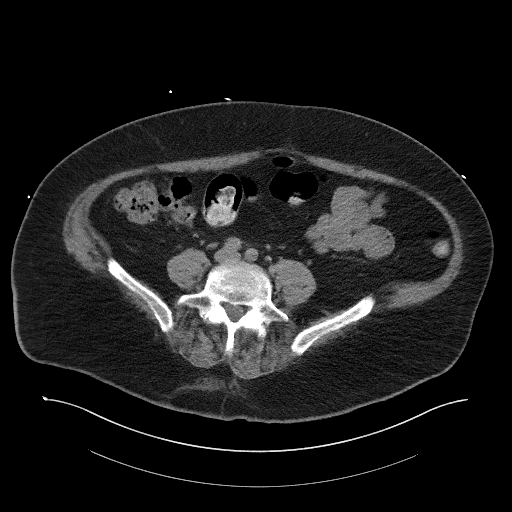
[im 58/116  soft-tissue]
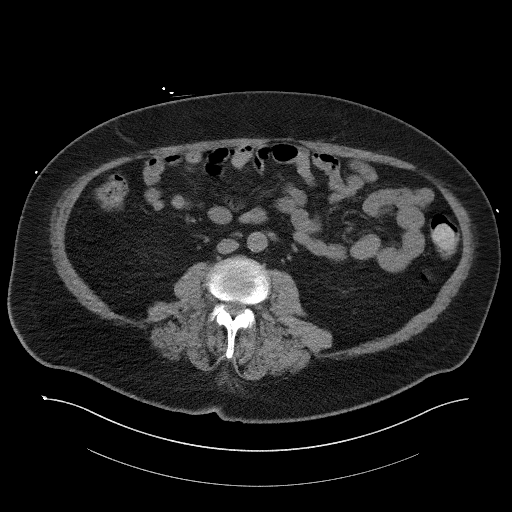
[im 68/116  soft-tissue]
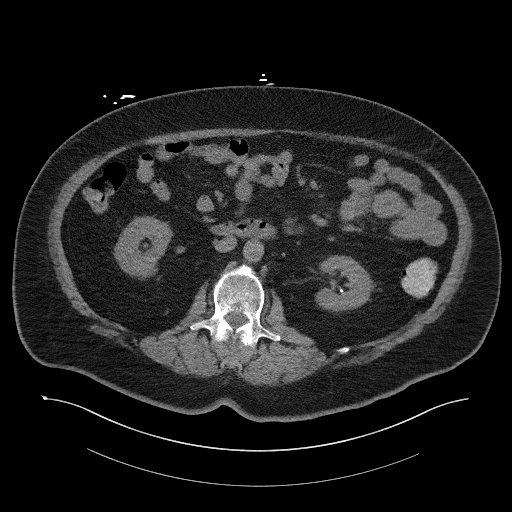
[im 77/116  soft-tissue]
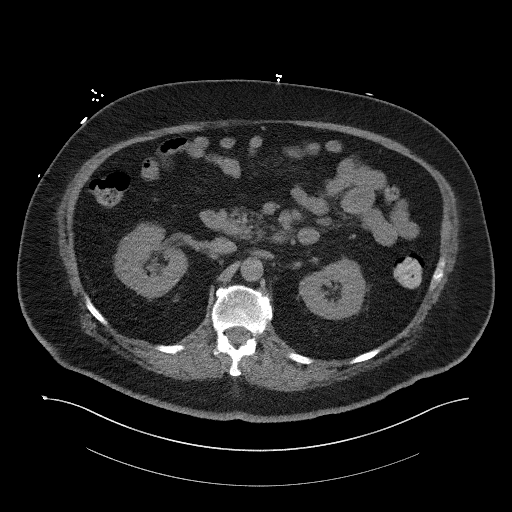
[im 77/116  bone]
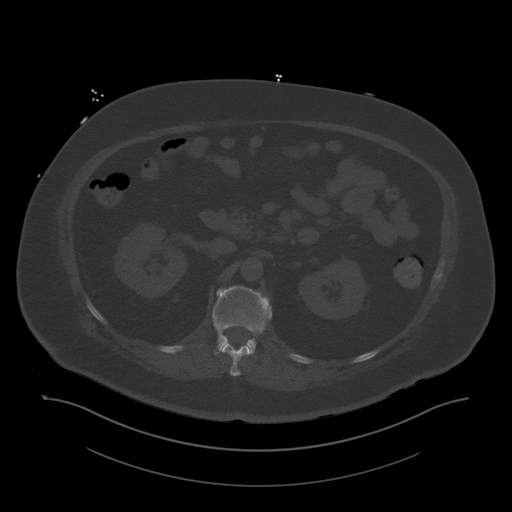
[im 82/116  soft-tissue]
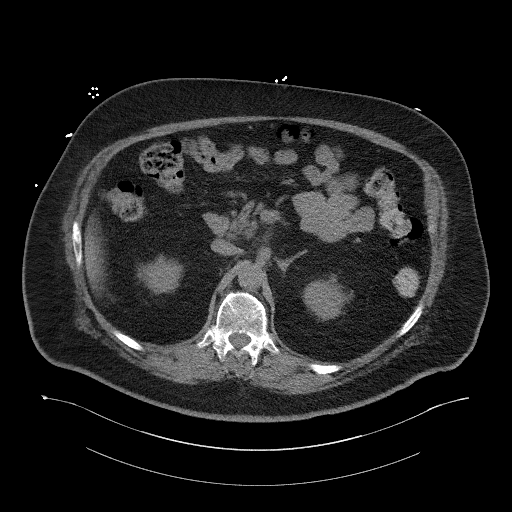
[im 92/116  soft-tissue]
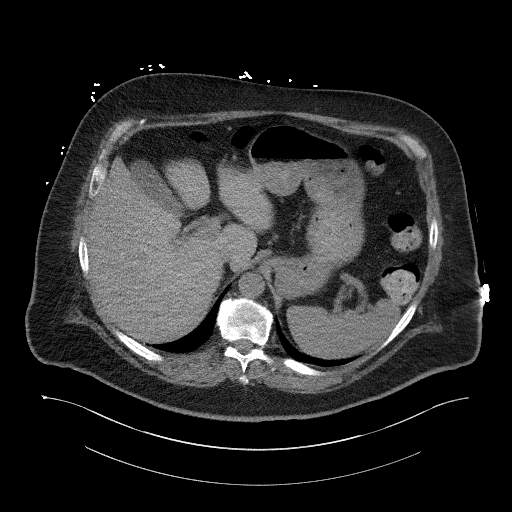
[im 101/116  soft-tissue]
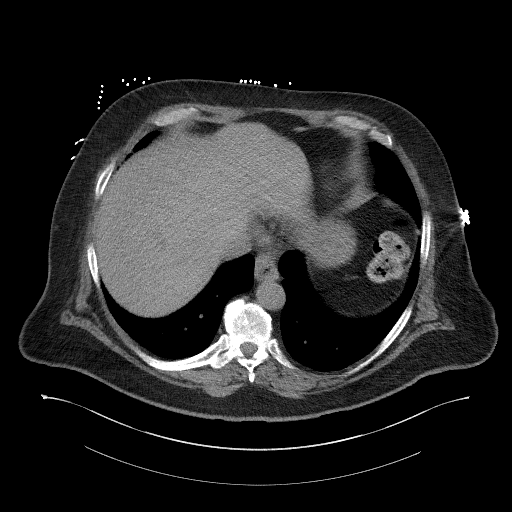
[im 111/116  soft-tissue]
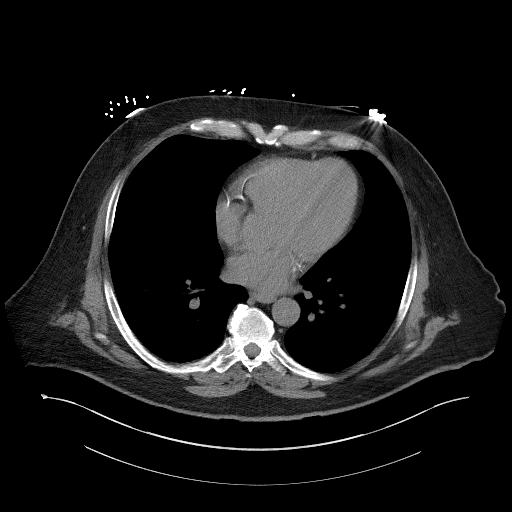

[Series 6: coronal st · coronal · 0.95mm/px · 3 of 103 slices shown]
[im 35/103  soft-tissue]
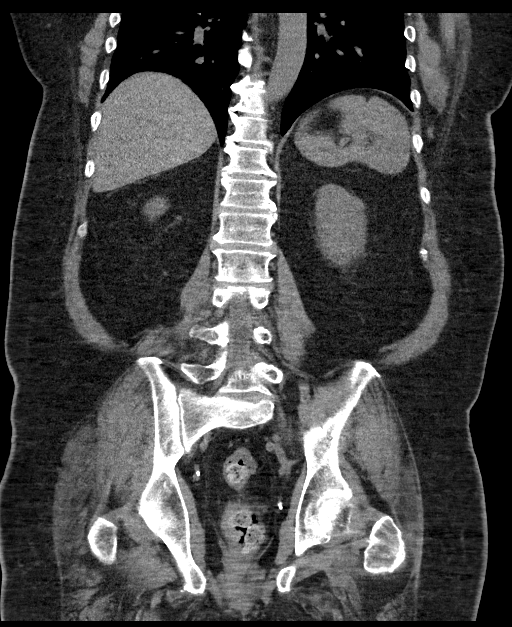
[im 46/103  soft-tissue]
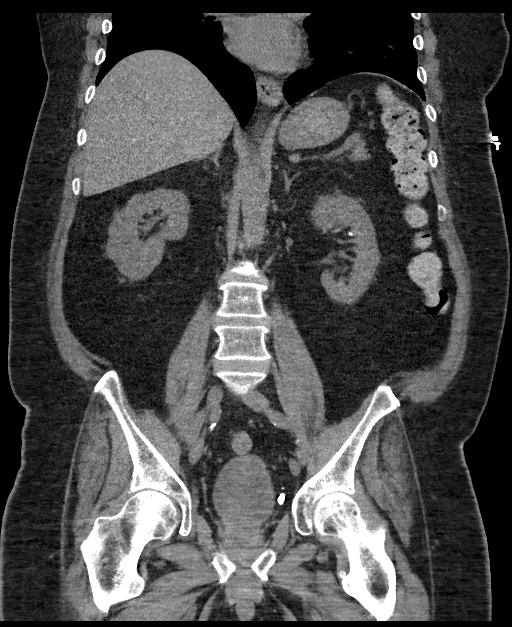
[im 57/103  soft-tissue]
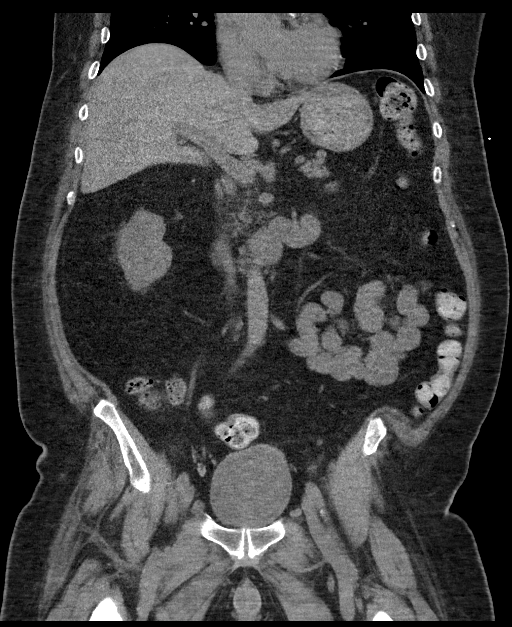

[16 of 46 positions shown; findings below may reference images not displayed]

FINDINGS: Lower chest: No acute abnormality. Coronary artery calcifications.
Normal size heart. No significant pericardial effusion/thickening.

Hepatobiliary: Tiny calcified hepatic granuloma in the right lobe of
the liver. No suspicious hepatic lesions on this noncontrast
examination. Gallbladder is unremarkable. No biliary ductal
dilation.

Pancreas: Within normal limits.

Spleen: Within normal limits.

Adrenals/Urinary Tract: Bilateral adrenal glands are unremarkable.

No hydronephrosis. No contour deforming renal masses. Punctate
nonobstructive left renal calculi measuring 2-3 mm.

Urinary bladder is grossly unremarkable delete that for degree of
distension.

Stomach/Bowel: Tiny hiatal hernia otherwise stomach is grossly
unremarkable. Normal positioning of the duodenum/ligament of [place].
No pathologic dilation of small bowel. Prominent formed stool
throughout the colon.

Vascular/Lymphatic: No significant vascular findings are present.
Prominent left external iliac lymph node is again visualized
measuring 1 cm in diameter, unchanged from prior. No pathologically
enlarged abdominal or pelvic lymph nodes.

Reproductive: Prostate is unremarkable.

Other: No abdominopelvic ascites.  No pneumoperitoneum.

Musculoskeletal: Multilevel degenerative changes spine. No acute
osseous abnormality. 1.4 cm sclerotic lesion centrally in the L3
vertebral body is nonspecific but unchanged dating back to
[DATE] and likely benign.
IMPRESSION: 1. No acute abnormality in the abdomen or pelvis visualized.
2. Prominent stool throughout the colon favors constipation.
3. Punctate nonobstructive left renal calculi.
4. Borderline prominent left external iliac node, likely reactive
and not increased from prior exam.
5. Aortic Atherosclerosis ([9G]-[9G]).

## 2021-01-03 MED ORDER — OXYCODONE-ACETAMINOPHEN 5-325 MG PO TABS
1.0000 | ORAL_TABLET | Freq: Once | ORAL | Status: AC
  Administered 2021-01-04: 1 via ORAL
  Filled 2021-01-03: qty 1

## 2021-01-03 MED ORDER — SODIUM CHLORIDE 0.9 % IV BOLUS
1000.0000 mL | Freq: Once | INTRAVENOUS | Status: AC
  Administered 2021-01-04: 1000 mL via INTRAVENOUS

## 2021-01-03 MED ORDER — TRESIBA FLEXTOUCH 200 UNIT/ML ~~LOC~~ SOPN: 45.0000 [IU] | PEN_INJECTOR | Freq: Every day | SUBCUTANEOUS | 2 refills | Status: DC

## 2021-01-03 MED ORDER — INSULIN PEN NEEDLE 31G X 5 MM MISC: 1.0000 | Freq: Four times a day (QID) | 2 refills | Status: DC

## 2021-01-03 MED ORDER — NOVOLOG FLEXPEN 100 UNIT/ML ~~LOC~~ SOPN: 12.0000 [IU] | PEN_INJECTOR | Freq: Three times a day (TID) | SUBCUTANEOUS | 2 refills | Status: DC

## 2021-01-03 MED ORDER — METFORMIN HCL ER 500 MG PO TB24: 1000.0000 mg | ORAL_TABLET | Freq: Two times a day (BID) | ORAL | 1 refills | Status: DC

## 2021-01-03 MED ORDER — KETOROLAC TROMETHAMINE 15 MG/ML IJ SOLN
15.0000 mg | Freq: Once | INTRAMUSCULAR | Status: AC
  Administered 2021-01-04: 15 mg via INTRAVENOUS
  Filled 2021-01-03: qty 1

## 2021-01-03 NOTE — ED Notes (Addendum)
Patient transported to CT/XR ?

## 2021-01-03 NOTE — Progress Notes (Signed)
Name: Tyler Brewer  MRN/ DOB: [id number], 1961/06/01   Age/ Sex: 60 y.o., male    PCP: Carollee Herter, Alferd Apa, DO   Reason for Endocrinology Evaluation: Type 2 Diabetes Mellitus     Date of Initial Endocrinology Visit: 01/03/2021     PATIENT IDENTIFIER: Mr. Tyler Brewer is a 60 y.o. male with a past medical history of T2DM, Dyslipidemia, CAD, OSA and chronic back pain,chronic inflammatory demyelinating polyneuropathy (CIPD),  . The patient presented for initial endocrinology clinic visit on 01/03/2021 for consultative assistance with his diabetes management.    HPI: Mr. Feger was    Diagnosed with DM ~2005 Prior Medications tried/Intolerance: Metformin- no intolerance  Currently checking blood sugars 2 x / day Hypoglycemia episodes : no  Hemoglobin A1c has ranged from 7.3% in 2017, peaking at 11.5% in 2021. Patient required assistance for hypoglycemia: no Patient has required hospitalization within the last 1 year from hyper or hypoglycemia: no - but had frequent ED visit for weakness, falls and back issues  In terms of diet, the patient  Eats 3 meals , snacks 2x a day, occasional sugar- sweetened beverages    Saw [facility] end[facility]ology in 2019 Has Dexcom pending , waiting on Co-pay.     HOME DIABETES REGIMEN: Tresiba 40 units daily  Novolog 10 units TIDQAC     Statin: yes ACE-I/ARB: yes Prior Diabetic Education: yes   METER DOWNLOAD SUMMARY: Did not bring     DIABETIC COMPLICATIONS: Microvascular complications:  Neuropathy Denies: CKD  Last eye exam: Completed > 2 yrs   Macrovascular complications:  CAD Denies:  PVD, CVA   PAST HISTORY: Past Medical History:  Past Medical History:  Diagnosis Date  . Arthritis   . CAD (coronary artery disease)  cardiac cath with moderate disease in a septal branch of the ramus intermedius 04/01/2012  . Depression   . Diabetes mellitus    poorly controlled by his report  . History of narcotic addiction ([place])    past  history of back pain  . Hypercholesteremia   . Hypertension   . IBS (irritable bowel syndrome)   . 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 Neuropathy   . Obesity    Max weight was 390  . OSA on CPAP   . Panic attacks   . Testosterone deficiency   . Vertigo    Past Surgical History:  Past Surgical History:  Procedure Laterality Date  . CARDIAC CATHETERIZATION    . IR FLUORO GUIDE CV LINE RIGHT  11/10/2020  . IR US GUIDE VASC ACCESS RIGHT  11/10/2020  . LEFT HEART CATHETERIZATION WITH CORONARY ANGIOGRAM N/A 03/31/2012   Procedure: LEFT HEART CATHETERIZATION WITH CORONARY ANGIOGRAM;  Surgeon: Leonie Man, MD;  Location: [facility] C[facility] LAB;  Service: Cardiovascular;  Laterality: N/A;    Social History:  reports that he has been smoking cigarettes. He has been smoking about 0.50 packs per day. He has never used smokeless tobacco. He reports previous drug use. Drug: Methamphetamines. He reports that he does not drink alcohol. Family History:  Family History  Problem Relation Age of Onset  . Diabetes type II Father   . Hypertension Father   . Pancreatic disease Father 66       Deceased  . Healthy Mother   . Healthy Sister   . Healthy Son   . Healthy Daughter   . Parkinson's disease Maternal Grandmother      HOME MEDICATIONS: Allergies as of 01/03/2021   No Known Allergies  Medication List       Accurate as of Jan 03, 2021  2:47 PM. If you have any questions, ask your nurse or doctor.        STOP taking these medications   HYDROcodone-acetaminophen 5-325 MG tablet Commonly known as: NORCO/VICODIN Stopped by: Dorita Sciara, MD   meloxicam 7.5 MG tablet Commonly known as: Mobic Stopped by: Dorita Sciara, MD     TAKE these medications   acetaminophen 500 MG tablet Commonly known as: TYLENOL Take 500 mg by mouth every 6 (six) hours as needed. States he is taking one to two tablets a day   ALLERGY RELIEF PO Take 1 tablet by mouth daily as needed  (allergies).   amLODipine 10 MG tablet Commonly known as: NORVASC Take 1 tablet (10 mg total) by mouth daily.   diclofenac Sodium 1 % Gel Commonly known as: Voltaren Apply 2 g topically 4 (four) times daily.   DULoxetine 30 MG capsule Commonly known as: Cymbalta Take 1 capsule (30 mg total) by mouth daily. What changed: when to take this   furosemide 20 MG tablet Commonly known as: LASIX TAKE 1 TABLET(20 MG) BY MOUTH DAILY   gabapentin 300 MG capsule Commonly known as: Neurontin Take 2 tablets in the morning, 1 tablet in the afternoon, and 1 tablet at bedtime. What changed:  how much to take how to take this when to take this additional instructions   hydrALAZINE 50 MG tablet Commonly known as: APRESOLINE Take 1 tablet (50 mg total) by mouth every 8 (eight) hours.   ibuprofen 200 MG tablet Commonly known as: ADVIL Take 200 mg by mouth every 6 (six) hours as needed.   ICY HOT EX Apply topically as needed.   insulin aspart 100 UNIT/ML injection Commonly known as: novoLOG Inject 10 Units into the skin 3 (three) times daily with meals.   Lidocaine HCl 1 % Gel Apply 1 application topically as needed. Reports lidocaine cream 1% 2-3 times/day to lower back/knee and to hip occasionally.   losartan 100 MG tablet Commonly known as: COZAAR Take 1 tablet (100 mg total) by mouth daily.   methocarbamol 500 MG tablet Commonly known as: ROBAXIN Take 1 tablet (500 mg total) by mouth 4 (four) times daily. What changed:  when to take this reasons to take this   pravastatin 40 MG tablet Commonly known as: PRAVACHOL TAKE ONE TABLET BY MOUTH ONE TIME DAILY   tamsulosin 0.4 MG Caps capsule Commonly known as: FLOMAX Take 1 capsule (0.4 mg total) by mouth daily.   tobramycin 0.3 % ophthalmic ointment Commonly known as: TOBREX Place into the left eye 3 (three) times daily.   traMADol 50 MG tablet Commonly known as: ULTRAM TAKE ONE TO TWO TABLETS BY MOUTH EVERY 8 HOURS AS  NEEDED FOR PAIN   Tresiba FlexTouch 100 UNIT/ML FlexTouch Pen Generic drug: insulin degludec Inject 40 Units into the skin daily.        ALLERGIES: No Known Allergies   REVIEW OF SYSTEMS: A comprehensive ROS was conducted with the patient and is negative except as per HPI and below:  Review of Systems  Gastrointestinal: Positive for constipation. Negative for diarrhea and nausea.  Musculoskeletal: Positive for back pain.  Neurological: Positive for weakness.      OBJECTIVE:   VITAL SIGNS: BP (!) 148/92   Pulse 90   Ht 5\' 11"  (1.803 m)   Wt 278 lb 8 oz (126.3 kg)   SpO2 98%   BMI 38.84  kg/m    PHYSICAL EXAM:  General: Pt drowsy in a wheelchair   Neck: General: Supple without adenopathy or carotid bruits. Thyroid: Thyroid size normal.  No goiter or nodules appreciated.   Lungs: Clear with good BS bilat with no rales, rhonchi, or wheezes  Heart: RRR with normal S1 and S2 and no gallops; no murmurs; no rub  Abdomen: Normoactive bowel sounds, soft, nontender, without masses or organomegaly palpable  Extremities:  Lower extremities -1+ pretibial edema. No lesions.  Skin: Normal texture and temperature to palpation. No rash noted.  Neuro: MS is good with appropriate affect, pt is alert and Ox3    DATA REVIEWED:  Lab Results  Component Value Date   HGBA1C 10.6 (H) 11/05/2020   HGBA1C 11.5 (H) 07/05/2020   HGBA1C 11.4 (H) 03/10/2020   Lab Results  Component Value Date   MICROALBUR <0.7 05/02/2017   LDLCALC 52 11/28/2020   CREATININE 1.01 12/10/2020   Lab Results  Component Value Date   MICRALBCREAT 0.9 05/02/2017    Lab Results  Component Value Date   CHOL 118 11/28/2020   HDL 40.00 11/28/2020   LDLCALC 52 11/28/2020   LDLDIRECT 124.0 05/02/2017   TRIG 130.0 11/28/2020   CHOLHDL 3 11/28/2020        ASSESSMENT / PLAN / RECOMMENDATIONS:   1) Type 2 Diabetes Mellitus, Poorly controlled, With neuropathic and macrovascular  complications - Most recent  A1c of 9.9 %. Goal A1c < 7.0 %.   Plan: GENERAL: I have discussed with the patient the pathophysiology of diabetes. We went over the natural progression of the disease. We stressed the importance of lifestyle changes including diet and exercise. I explained the complications associated with diabetes including retinopathy, nephropathy, neuropathy as well as increased risk of cardiovascular disease. We went over the benefit seen with glycemic control.   I explained to the patient that diabetic patients are at higher than normal risk for amputations. He has been eating 4-5 meals a day but taking prandial 3x a day, today he ate before leaving the house and did not take prandial insulin , hence the BG 353 mg/dL  Dexcom has been approved per pt , they are waiting on copay. I offered him a referral for dexcom training but he would like to use the training video  Discussed pharmacokinetics of basal/bolus insulin and the importance of taking prandial insulin with meals.  We also discussed avoiding sugar-sweetened beverages and snacks, when possible.  Used to be on metformin, he is not clear on why this was stopped, does not recall side effects, will restart as below   MEDICATIONS: Metformin 500 mg XR 2 tabs BID   Increase Tresiba to 45 units daily   Increase Novolog to 12 units with each meal    EDUCATION / INSTRUCTIONS: BG monitoring instructions: Patient is instructed to check his blood sugars 3 times a day, before meals . Call [place] [facility] if: BG persistently < 70 I reviewed the Rule of 15 for the treatment of hypoglycemia in detail with the patient. Literature supplied.   2) Diabetic complications:  Eye: Does not have known diabetic retinopathy.  Neuro/ Feet: Does have known diabetic peripheral neuropathy. Renal: Patient does not have known baseline CKD. He is not on an ACEI/ARB at present.    3) Dyslipidemia: LDL at goal.    Continue Pravastatin 40 mg daily          Signed electronically by: Mack Guise, MD  [facility] End[facility]  [facility] [address] [place], [place] [address] Phone: [phone] FAX: [phone]   CC: [name], DO [address] [place] [address] Phone: [phone]  Fax: [phone]    Return to Endocrinology clinic as below: Future Appointments  Date Time Provider [place]  01/03/2021  3:00 PM Kimberlin Scheel, Melanie Crazier, MD [facility] [place]  01/04/2021  1:45 PM [facility] PHARMACIST [facility]  01/05/2021 10:10 AM Narda Amber K, DO [facility] None  01/30/2021  2:00 PM [facility]

## 2021-01-03 NOTE — ED Triage Notes (Signed)
C/o R sd groin & back pain x4 days; hx of CIDP. States walking down hallway & had syncopal episode. Did hit head; denies headache or visual changes.  VS PTA:  178/105 85 18 97% RA CBG 279

## 2021-01-03 NOTE — Patient Instructions (Addendum)
-   Start Metformin 1 tablet daily with Breakfast for 1 week, then increase to 1 tablet with Breakfast and 1 tablet with Supper for  1 week, then increase to 2 tablets with Breakfast  and 1 tablet with Supper for another 1 week , then finally 2 Tablets with Breakfast and 2 tablets with Supper.   - Increase Tresiba to 45 units daily  - Increase Novolog to 12 units with each meal      HOW TO TREAT LOW BLOOD SUGARS (Blood sugar LESS THAN 70 MG/DL)  Please follow the RULE OF 15 for the treatment of hypoglycemia treatment (when your (blood sugars are less than 70 mg/dL)    STEP 1: Take 15 grams of carbohydrates when your blood sugar is low, which includes:   3-4 GLUCOSE TABS  OR  3-4 OZ OF JUICE OR REGULAR SODA OR  ONE TUBE OF GLUCOSE GEL     STEP 2: RECHECK blood sugar in 15 MINUTES STEP 3: If your blood sugar is still low at the 15 minute recheck --> then, go back to STEP 1 and treat AGAIN with another 15 grams of carbohydrates.

## 2021-01-03 NOTE — ED Provider Notes (Signed)
[facility] Emergency Department Provider Note MRN:  [id number]  Arrival date & time: 01/04/21     Chief Complaint   Loss of Consciousness   History of Present Illness   Tyler Brewer is a 60 y.o. year-old male with a history of CAD, leg weakness presenting to the ED with chief complaint of loss of consciousness.  Patient was walking across his kitchen and was having trouble with his leg weakness.  Then began to feel lightheaded and fell.  Not sure if he lost consciousness.  Endorsing head trauma, neck pain, lower back pain, groin pain for the past several days.  No significant change to his leg weakness recently.  Denies any fever or cold-like symptoms, no chest pain or shortness of breath.  Feels weak and painful all over.  Symptoms constant, moderate to severe, no exacerbating or alleviating factors.  Review of Systems  A complete 10 system review of systems was obtained and all systems are negative except as noted in the HPI and PMH.   Patient's Health History    Past Medical History:  Diagnosis Date  . Arthritis   . CAD (coronary artery disease)  cardiac cath with moderate disease in a septal branch of the ramus intermedius 04/01/2012  . Depression   . Diabetes mellitus    poorly controlled by his report  . History of narcotic addiction ([place])    past history of back pain  . Hypercholesteremia   . Hypertension   . IBS (irritable bowel syndrome)   . 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 Neuropathy   . Obesity    Max weight was 390  . OSA on CPAP   . Panic attacks   . Testosterone deficiency   . Vertigo     Past Surgical History:  Procedure Laterality Date  . CARDIAC CATHETERIZATION    . IR FLUORO GUIDE CV LINE RIGHT  11/10/2020  . IR US GUIDE VASC ACCESS RIGHT  11/10/2020  . LEFT HEART CATHETERIZATION WITH CORONARY ANGIOGRAM N/A 03/31/2012   Procedure: LEFT HEART CATHETERIZATION WITH CORONARY ANGIOGRAM;  Surgeon: Leonie Man, MD;   Location: [facility] CATH LAB;  Service: Cardiovascular;  Laterality: N/A;    Family History  Problem Relation Age of Onset  . Diabetes type II Father   . Hypertension Father   . Pancreatic disease Father 23       Deceased  . Healthy Mother   . Healthy Sister   . Healthy Son   . Healthy Daughter   . Parkinson's disease Maternal Grandmother     Social History   Socioeconomic History  . Marital status: Single    Spouse name: Not on file  . Number of children: 2  . Years of education: Not on file  . Highest education level: Not on file  Occupational History  . Not on file  Tobacco Use  . Smoking status: Current Every Day Smoker    Packs/day: 0.50    Types: Cigarettes    Last attempt to quit: 09/29/2020    Years since quitting: 0.2  . Smokeless tobacco: Never Used  Vaping Use  . Vaping Use: Never used  Substance and Sexual Activity  . Alcohol use: No    Alcohol/week: 0.0 standard drinks  . Drug use: Not Currently    Types: Methamphetamines  . Sexual activity: Yes  Other Topics Concern  . Not on file  Social History Narrative     Has 2 grown children.     Works  as a Barrister's clerk.  Education: Oceanographer.   Right Handed   Drinks Caffeine    Mother recently moved and sold her house-- pt is living with a friend in a trailer    Social Determinants of Health   Financial Resource Strain: Medium Risk  . Difficulty of Paying Living Expenses: Somewhat hard  Food Insecurity: No Food Insecurity  . Worried About Charity fundraiser in the Last Year: Never true  . Ran Out of Food in the Last Year: Never true  Transportation Needs: No Transportation Needs  . Lack of Transportation (Medical): No  . Lack of Transportation (Non-Medical): No  Physical Activity: Not on file  Stress: Not on file  Social Connections: Not on file  Intimate Partner Violence: Not on file     Physical Exam   Vitals:   01/04/21 0330 01/04/21 0501  BP: (!) 177/88 (!) 179/89  Pulse: 73 73  Resp: 19 17  Temp:     SpO2: 100% 97%    CONSTITUTIONAL: Well-appearing, NAD NEURO:  Alert and oriented x 3, decreased strength of bilateral lower extremities, poor effort EYES:  eyes equal and reactive ENT/NECK:  no LAD, no JVD CARDIO: Regular rate, well-perfused, normal S1 and S2 PULM:  CTAB no wheezing or rhonchi GI/GU:  normal bowel sounds, non-distended, non-tender MSK/SPINE:  No gross deformities, no edema SKIN:  no rash, atraumatic PSYCH:  Appropriate speech and behavior  *Additional and/or pertinent findings included in MDM below  Diagnostic and Interventional Summary    EKG Interpretation  Date/Time:  Tuesday Jan 03 2021 23:15:12 EDT Ventricular Rate:  82 PR Interval:  193 QRS Duration: 102 QT Interval:  370 QTC Calculation: 433 R Axis:   18 Text Interpretation: Sinus rhythm Confirmed by Gerlene Fee [phone]) on 01/04/2021 12:40:51 AM      Labs Reviewed  COMPREHENSIVE METABOLIC PANEL - Abnormal; Notable for the following components:      Result Value   Sodium 134 (*)    Glucose, Bld 286 (*)    BUN 22 (*)    Total Bilirubin 0.2 (*)    All other components within normal limits  CBC    DG Chest Port 1 View  Final Result    CT HEAD WO CONTRAST  Final Result    CT CERVICAL SPINE WO CONTRAST  Final Result    CT ABDOMEN PELVIS WO CONTRAST  Final Result      Medications  sodium chloride 0.9 % bolus 1,000 mL (0 mLs Intravenous Stopped 01/04/21 0140)  ketorolac (TORADOL) 15 MG/ML injection 15 mg (15 mg Intravenous Given 01/04/21 0024)  oxyCODONE-acetaminophen (PERCOCET/ROXICET) 5-325 MG per tablet 1 tablet (1 tablet Oral Given 01/04/21 0023)  methocarbamol (ROBAXIN) injection 1,000 mg (1,000 mg Intramuscular Given 01/04/21 0258)     Procedures  /  Critical Care Procedures  ED Course and Medical Decision Making  I have reviewed the triage vital signs, the nursing notes, and pertinent available records from the EMR.  Listed above are laboratory and imaging tests that I  personally ordered, reviewed, and interpreted and then considered in my medical decision making (see below for details).  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  Also with groin pain for the past several days.  No palpable hernia, no abscess or signs of infection on my exam.  Vital signs are normal.  Neurological exam seems to be at his baseline.  Will need laboratory evaluation, CT  imaging to exclude traumatic injury and CT will also benefit diagnostic work-up of this groin pain.     Work-up is reassuring, no significant injuries, no emergent process.  Patient able to ambulate to the restroom but with difficulty.  Having intermittent back pain described as a spasm.  He feels very unsteady and unsafe at home.  He explains that he has some friends living with him but all they can do is "pick me up off the ground if I fall".  He is interested in SNF placement, per the recent physical therapy recommendations.  And so we will consult TOC for placement.  Send out to default provider at shift change.  Barth Kirks. [place] Small, [place] [email or web address]  Final Clinical Impressions(s) / ED Diagnoses     ICD-10-CM   1. Near syncope  R55   2. Fall, initial encounter  W19.[facility]     ED Discharge Orders    None       Discharge Instructions Discussed with and Provided to Patient:   Discharge Instructions   None       Maudie Flakes, MD 01/04/21 [phone]

## 2021-01-04 ENCOUNTER — Telehealth: Payer: Medicare HMO

## 2021-01-04 ENCOUNTER — Telehealth: Payer: Self-pay | Admitting: *Deleted

## 2021-01-04 ENCOUNTER — Ambulatory Visit: Payer: Medicare HMO | Admitting: *Deleted

## 2021-01-04 DIAGNOSIS — R29898 Other symptoms and signs involving the musculoskeletal system: Secondary | ICD-10-CM

## 2021-01-04 DIAGNOSIS — G8929 Other chronic pain: Secondary | ICD-10-CM

## 2021-01-04 DIAGNOSIS — Z743 Need for continuous supervision: Secondary | ICD-10-CM | POA: Diagnosis not present

## 2021-01-04 DIAGNOSIS — R2689 Other abnormalities of gait and mobility: Secondary | ICD-10-CM

## 2021-01-04 DIAGNOSIS — R55 Syncope and collapse: Secondary | ICD-10-CM | POA: Diagnosis not present

## 2021-01-04 DIAGNOSIS — R251 Tremor, unspecified: Secondary | ICD-10-CM

## 2021-01-04 DIAGNOSIS — M545 Low back pain, unspecified: Secondary | ICD-10-CM

## 2021-01-04 DIAGNOSIS — IMO0002 Reserved for concepts with insufficient information to code with codable children: Secondary | ICD-10-CM

## 2021-01-04 DIAGNOSIS — E1140 Type 2 diabetes mellitus with diabetic neuropathy, unspecified: Secondary | ICD-10-CM

## 2021-01-04 DIAGNOSIS — Z043 Encounter for examination and observation following other accident: Secondary | ICD-10-CM | POA: Diagnosis not present

## 2021-01-04 DIAGNOSIS — R531 Weakness: Secondary | ICD-10-CM

## 2021-01-04 DIAGNOSIS — R600 Localized edema: Secondary | ICD-10-CM

## 2021-01-04 DIAGNOSIS — Z7409 Other reduced mobility: Secondary | ICD-10-CM | POA: Diagnosis not present

## 2021-01-04 LAB — URINALYSIS, ROUTINE W REFLEX MICROSCOPIC
Bilirubin Urine: NEGATIVE
Glucose, UA: >=500 mg/dL — AB
Hgb urine dipstick: NEGATIVE
Ketones, ur: NEGATIVE mg/dL
Leukocytes,Ua: NEGATIVE
Nitrite: NEGATIVE
Protein, ur: NEGATIVE mg/dL
Specific Gravity, Urine: 1.015 (ref 1.005–1.030)
pH: 7 (ref 5.0–8.0)

## 2021-01-04 LAB — COMPREHENSIVE METABOLIC PANEL
ALT: 31 U/L (ref 0–44)
AST: 16 U/L (ref 15–41)
Albumin: 3.9 g/dL (ref 3.5–5.0)
Alkaline Phosphatase: 101 U/L (ref 38–126)
Anion gap: 9 (ref 5–15)
BUN: 22 mg/dL — ABNORMAL HIGH (ref 6–20)
CO2: 25 mmol/L (ref 22–32)
Calcium: 9.2 mg/dL (ref 8.9–10.3)
Chloride: 100 mmol/L (ref 98–111)
Creatinine, Ser: 1.01 mg/dL (ref 0.61–1.24)
GFR, Estimated: >60 mL/min (ref >60)
Glucose, Bld: 286 mg/dL — ABNORMAL HIGH (ref 70–99)
Potassium: 4 mmol/L (ref 3.5–5.1)
Sodium: 134 mmol/L — ABNORMAL LOW (ref 135–145)
Total Bilirubin: 0.2 mg/dL — ABNORMAL LOW (ref 0.3–1.2)
Total Protein: 7.4 g/dL (ref 6.5–8.1)

## 2021-01-04 LAB — CBG MONITORING, ED
Glucose-Capillary: 193 mg/dL — ABNORMAL HIGH (ref 70–99)
Glucose-Capillary: 143 mg/dL — ABNORMAL HIGH (ref 70–99)
Glucose-Capillary: 134 mg/dL — ABNORMAL HIGH (ref 70–99)

## 2021-01-04 LAB — CBC
HCT: 42.7 % (ref 39.0–52.0)
Hemoglobin: 14.5 g/dL (ref 13.0–17.0)
MCH: 30.1 pg (ref 26.0–34.0)
MCHC: 34 g/dL (ref 30.0–36.0)
MCV: 88.8 fL (ref 80.0–100.0)
Platelets: 285 10*3/uL (ref 150–400)
RBC: 4.81 MIL/uL (ref 4.22–5.81)
RDW: 12.5 % (ref 11.5–15.5)
WBC: 7.4 10*3/uL (ref 4.0–10.5)
nRBC: 0 % (ref 0.0–0.2)

## 2021-01-04 LAB — ARBOVIRUS PANEL, ~~LOC~~ LAB

## 2021-01-04 LAB — RESP PANEL BY RT-PCR (FLU A&B, COVID) ARPGX2
Influenza A by PCR: NEGATIVE
Influenza B by PCR: NEGATIVE
SARS Coronavirus 2 by RT PCR: NEGATIVE

## 2021-01-04 LAB — URINALYSIS, MICROSCOPIC (REFLEX)

## 2021-01-04 MED ORDER — GABAPENTIN 300 MG PO CAPS: 300.0000 mg | ORAL_CAPSULE | Freq: Two times a day (BID) | ORAL | Status: DC

## 2021-01-04 MED ORDER — TAMSULOSIN HCL 0.4 MG PO CAPS
0.4000 mg | ORAL_CAPSULE | Freq: Every day | ORAL | Status: DC
  Administered 2021-01-04: 0.4 mg via ORAL
  Filled 2021-01-04: qty 1

## 2021-01-04 MED ORDER — AMLODIPINE BESYLATE 5 MG PO TABS
10.0000 mg | ORAL_TABLET | Freq: Every day | ORAL | Status: DC
  Administered 2021-01-04: 10 mg via ORAL
  Filled 2021-01-04: qty 2

## 2021-01-04 MED ORDER — OXYCODONE-ACETAMINOPHEN 5-325 MG PO TABS
1.0000 | ORAL_TABLET | Freq: Once | ORAL | Status: AC
  Administered 2021-01-04: 1 via ORAL
  Filled 2021-01-04: qty 1

## 2021-01-04 MED ORDER — METFORMIN HCL ER 500 MG PO TB24
1000.0000 mg | ORAL_TABLET | Freq: Every day | ORAL | Status: DC
  Administered 2021-01-04: 1000 mg via ORAL
  Filled 2021-01-04: qty 2

## 2021-01-04 MED ORDER — INSULIN ASPART 100 UNIT/ML ~~LOC~~ SOLN
12.0000 [IU] | Freq: Three times a day (TID) | SUBCUTANEOUS | Status: DC
  Administered 2021-01-04 (×2): 12 [IU] via SUBCUTANEOUS
  Filled 2021-01-04: qty 0.12

## 2021-01-04 MED ORDER — METHOCARBAMOL 1000 MG/10ML IJ SOLN
1000.0000 mg | Freq: Once | INTRAMUSCULAR | Status: AC
  Administered 2021-01-04: 1000 mg via INTRAMUSCULAR
  Filled 2021-01-04: qty 10

## 2021-01-04 MED ORDER — FUROSEMIDE 20 MG PO TABS
20.0000 mg | ORAL_TABLET | Freq: Every day | ORAL | Status: DC
  Administered 2021-01-04: 20 mg via ORAL
  Filled 2021-01-04: qty 1

## 2021-01-04 MED ORDER — LOSARTAN POTASSIUM 25 MG PO TABS
100.0000 mg | ORAL_TABLET | Freq: Every day | ORAL | Status: DC
  Administered 2021-01-04: 100 mg via ORAL
  Filled 2021-01-04: qty 4

## 2021-01-04 MED ORDER — HYDRALAZINE HCL 25 MG PO TABS
50.0000 mg | ORAL_TABLET | Freq: Three times a day (TID) | ORAL | Status: DC
  Administered 2021-01-04: 50 mg via ORAL
  Filled 2021-01-04: qty 2

## 2021-01-04 MED ORDER — INSULIN GLARGINE 100 UNIT/ML ~~LOC~~ SOLN
45.0000 [IU] | Freq: Every day | SUBCUTANEOUS | Status: DC
  Administered 2021-01-04: 45 [IU] via SUBCUTANEOUS
  Filled 2021-01-04: qty 1

## 2021-01-04 MED ORDER — GABAPENTIN 300 MG PO CAPS: 300.0000 mg | ORAL_CAPSULE | ORAL | Status: DC

## 2021-01-04 MED ORDER — GABAPENTIN 300 MG PO CAPS
600.0000 mg | ORAL_CAPSULE | Freq: Every day | ORAL | Status: DC
  Administered 2021-01-04: 600 mg via ORAL
  Filled 2021-01-04: qty 2

## 2021-01-04 MED ORDER — TRAMADOL HCL 50 MG PO TABS
50.0000 mg | ORAL_TABLET | Freq: Two times a day (BID) | ORAL | Status: DC | PRN
  Administered 2021-01-04: 50 mg via ORAL
  Filled 2021-01-04: qty 1

## 2021-01-04 NOTE — Patient Instructions (Signed)
Visit Information   PATIENT GOALS:  Goals Addressed              This Visit's Progress   .  Receive Short-Term Rehabilitative Services in a [place]. (pt-stated)   On track     Timeframe:  Short-Range Goal Priority:  High Start Date:  01/04/2021                          Expected End Date: 01/17/2021  Follow-Up Plan:  01/06/2021 at 3:00pm.  Patient Goals/Self-Care Activities: . Continue to work with home health nursing, physical therapist, occupational therapist and Education officer, museum. [name] Kitchen Answer all calls from LCSW [phone]).       Consent to CCM Services: Mr. Pizzi was given information about Chronic Care Management services today including:  1. CCM service includes personalized support from designated clinical staff supervised by his physician, including individualized plan of care and coordination with other care providers 2. 24/7 contact phone numbers for assistance for urgent and routine care needs. 3. Service will only be billed when office clinical staff spend 20 minutes or more in a month to coordinate care. 4. Only one practitioner may furnish and bill the service in a calendar month. 5. The patient may stop CCM services at any time (effective at the end of the month) by phone call to the office staff. 6. The patient will be responsible for cost sharing (co-pay) of up to 20% of the service fee (after annual deductible is met).  Patient agreed to services and verbal consent obtained.   Patient verbalizes understanding of instructions provided today and agrees to view in [place].   Telephone follow up appointment with care management team member scheduled for:  01/06/2021 at 3:00pm.  Nat Christen LCSW Licensed Clinical Social Worker [place] [phone]  CLINICAL CARE PLAN: Patient Care Plan: Quality of Life    Problem Identified: Quality of Life   Priority: Medium    Long-Range Goal: Quality of Life  Maintained/Improved   Start Date: 12/06/2020  Expected End Date: 06/08/2021  This Visit's Progress: On track  Recent Progress: On track  Note:   Current Barriers:  [name] Kitchen Knowledge Deficits related to long term care plan to improve/manage quality of life. Client with history of chronic disease, uncontrolled DM. Patient reports a history of falls, weakness, limited mobility. Recent hospitalization 11/04/20-11/21/20 with bilateral weakness and lower extremity edema. Client reports some improvement. He denies any falls, continues to use assistive devices as needed. And continues with home health physical therapy and nursing once a week. Client reports he has an neurology appointment with Dr. Posey Pronto on 01/05/21 and an appointment with endocrinologist on 01/03/21.  [name] Kitchen Chronic Disease Management support and education needs related to health management . Unable to independently stand for periods of time for house hold chores -support from friends.  . Limited Transportation-reports supportive friends, but not always available to take client to appointments and for pharmacy refills. Nurse Case Manager Clinical Goal(s):  . patient will verbalize understanding of plan for maintenance/improvement of quality of life. Work toward goal of "get back healthy enough to join [employer]" . patient will attend all scheduled medical appointments:  . patient will work with CM clinical social worker to discuss life concerns related to family relationships; limited support. . the patient will demonstrate ongoing self health care management ability as evidenced by attending provider visits as scheduled, taking medications as prescribed, contacting provider for  new or worsening symptoms Interventions:  . 1:1 collaboration with Carollee Herter, Alferd Apa, DO regarding development and update of comprehensive plan of care as evidenced by provider attestation and co-signature . Inter-disciplinary care team collaboration (see  longitudinal plan of care) . Evaluation of current treatment plan related to patient's health conditions and patient's adherence to plan as established by provider. . Advised patient to call Neurologist appointment to reschedule appointment.  . Encouraged patient to continue therapy sessions with home health as scheduled . Reviewed medications with patient . Reinforced fall prevention strategies . Discussed pain management - encouraged client to continue take medications as prescribed and elevate feet/legs for edema and contact provider if treatment not effective or worsens, and continue to be active and work with physical therapy. . Emergent Referral made to Care Guide to assist with transportation needs. . Referral to [facility] pharmacist for medication delivery options and assist with financial concern regarding any new upcoming medication prescriptions by endocrinologist. . Reviewed scheduled/upcoming provider appointments including: 01/03/21 with Endocrinologist; 01/05/21 with Neurologist; client states he will call to schedule an appointment with primary care provider.  . Discussed Embedded Clinic Social Worker's inability to reach client. Provided Embedded clinic social workers contact number.  . Care coordination with Embedded social worker: informed that client has outreached to provider office for follow up with embedded social worker. Social worker contact number provided to client to outreach to Education officer, museum.  . Discussed plans with patient for ongoing care management follow up and provided patient with direct contact information for care management team  Patient Goals/Self-Care Activities -Attend Neurologist office visit as scheduled. Currently scheduled for 01/05/21. Contact RNCM if you are having transportation prior to appointment date. -Schedule follow up appointment with Primary care provider- around 02/27/21 or sooner if needed. -Take medications as prescribed and follow up  with provider if condition is not relieved or worsens. -Continue to Elevate legs feet when sitting or laying in bed. -Discuss with Clinic pharmacist request for possible medication delivery options and medication assistance needs if any.  -Continue therapy sessions with home health providers and perform exercises provided per their recommendation. -Continue to use assistive devices as recommended and minimize fall risk(eliminate small throw rugs, keep home well lite, remove clutter from [place] worker to discuss life concerns revolving around family relationships.  Follow Up Plan: The patient has been provided with contact information for the care management team and has been advised to call with any health related questions or concerns.  The care management team will reach out to the patient again over the next 30 days.          Patient Care Plan: Diabetes Type 2 in patient with history of Hyperlipidemia    Problem Identified: Disease Progression   Priority: Medium    Long-Range Goal: Disease Progression Prevented or Minimized   Start Date: 12/06/2020  Expected End Date: 06/08/2021  This Visit's Progress: On track  Recent Progress: On track  Note:   Objective:  Lab Results  Component Value Date   HGBA1C 10.6 (H) 11/05/2020 .   Lab Results  Component Value Date   CREATININE 0.89 11/28/2020   CREATININE 1.11 11/20/2020   CREATININE 0.86 11/19/2020   Current Barriers:  [name] Kitchen Knowledge Deficits related to long term care plan for management of diabetes. Client reports he has not obtained his glucose meter because he has an $18 co-pay, and will have it next week to get the meter. Client has an appointment with  endocrinologist on 01/03/21. He states he is eating better than before: more fruits/vegetables, and sugar free desserts.   . Knowledge deficit re: healthier eating plan . Limited family support, but reports he has supportive friends. Currently staying with  friends. . Limited mobility . Unable to independently stand for activities such as cooking, cleaning-reports still active with home health PT/Nursing . Transportation needs Case Manager Clinical Goal(s):  [name] Kitchen Patient will attend initial visit with Endocrinologist:re-scheduled for 01/03/21 . Patient will obtain glucose meter and begin checking blood sugars. . patient will demonstrate improved adherence to prescribed treatment plan for diabetes self care/management as evidenced by: daily monitoring and recording of CBG  adherence to prescribed medication regimen contacting provider for new or worsened symptoms or questions Interventions:  . Collaboration with [place], DO regarding development and update of comprehensive plan of care as evidenced by provider attestation and co-signature . Inter-disciplinary care team collaboration (see longitudinal plan of care) . Reviewed medications with patient and discussed importance of medication adherence . Discussed signs/symptoms of hypo and hyper glycemia . Discussed patient diet . Encouraged client to review and use Calendar/organizer . Reviewed scheduled/upcoming provider appointments including: PCP, neurology, endocrinologist  . Referral made to social work team for assistance with talk therapy-chronic health conditions, relationship with children strained . Review of patient status, including review of relevant laboratory completed . Discussed plans with patient for ongoing care management follow up and provided patient with direct contact information for care management team . Emergent Care Guide referral regarding possible assistance to appointment 01/03/21/transportation needs. Patient Goals: -attend provider visit as scheduled-currently scheduled for 01/03/21. -obtain glucose meter -take medications as prescribed. -check blood sugar as recommended by provider -know blood sugar target range and when to call doctor -know target  Hemoglobin A1C -enter blood sugar readings and medication or insulin into daily log and take to provider appointments - take the blood sugar meter to all doctor visits once you have begun using - limit fast food meals to no more than 1 per week - schedule appointment with eye doctor -Call [facility] if you have any questions regarding use of calendar/organizer.  -Discuss Nutrition consult with provider at next visit  Follow Up Plan: The patient has been provided with contact information for the care management team and has been advised to call with any health related questions or concerns.  The care management team will reach out to the patient again over the next 30 days.    Patient Care Plan: LCSW Plan of Care    Problem Identified: Receive Short-Term Rehabilitative Services in a [place].   Priority: High    Goal: Receive Short-Term Rehabilitative Services in a [place].   Start Date: 01/04/2021  Expected End Date: 01/17/2021  This Visit's Progress: On track  Priority: High  Note:   Current Barriers:   . Patient with History of Falls/Fall Risk, Unsteady Gait, Impaired Mobility, Weakness, Tremor, Syncope, Balance Problem and Lower Extremity Edema needs assistance with initiating short-term rehabilitative services in a skilled nursing facility. . Patient's health has declined and patient is no longer able to complete activities of daily living independently. . Patient acknowledges deficits with education and support in order to meet this need. . Patient does not attend all scheduled provider appointments. . Patient does not adhere to prescribed medication regimen. . Patient lacks social connections. . Patient is experiencing financial constraints related to inability to pay for rent/housing. . Patient has limited social support. . Patient has  level of care concerns. . Patient has mental health concerns. . Patient is experiencing family and relationship  dysfunction. . Patient has limited access to caregiver. . Patient is experiencing memory deficits. Clinical Goal(s):  [name] Kitchen Over the next 30 to 45 days, patient will be placed in a skilled nursing facility to receive short-term rehabilitative services. . Patient will work with LCSW, as well as Home Health Social Worker, Chiropodist with [facility], to coordinate needs for short-term skilled nursing placement for rehabilitative services. Interventions: . Collaboration with Primary Care Physician, Dr. [name] regarding development and update of comprehensive plan of care as evidenced by provider attestation and co-signature. Bertram Savin care team collaboration (see longitudinal plan of care). . Assessed needs and provided education on level of care and facility placement process. . Completed FL-2 Form and faxed to Primary Care Physician, Dr. [name] for review and signature. . Obtained patient's verbal consent to fax FL-2 Form to facilities of interest. . Obtained patient's verbal consent to initiate a PASSAR number. . Reviewed and initiated process to obtain PASSAR number. [name] Kitchen FL-2 Form faxed to [facility] and [place], per patient's request. . Collaboration with identified facilities of interest [facility] and [facility]) to confirm receipt of completed and signed FL-2 Form. . Collaboration with Mora Appl, Home Health Physical Therapist with [facility] - # [phone], to request home health physical therapy notes and recommendations. . Collaboration with Costella Hatcher, [place] Occupational Therapist with [facility] - # [phone], to request home health occupational therapy notes and recommendations. [name] Kitchen Collaboration with Lattie Haw, [facility] Social Worker with [facility] - # [phone], to request assistance with skilled nursing placement for patient. . Collaboration with Dr.  Madalyn Rob, Attending Physician at [facility]) regarding request for rapid COVID-19 Screening. . Collaboration with representative from [employer] to try and obtain approval/prior authorization for short-term skilled nursing placement. . Faxed home health physical and occupational therapy notes and recommendations, along with completed and signed FL-2 Form to [facility] to try and obtain approval/prior authorization for short-term skilled nursing placement. . Patient interviewed and appropriate assessments performed. . Discussed plans with patient for ongoing care management follow up and provided patient with direct contact information for care management team. . Advised patient to answer all calls from LCSW, confirming contact information for LCSW. Patient Goals/Self-Care Activities: . Continue to work with home health nursing, physical therapist, occupational therapist and Education officer, museum. [name] Kitchen Answer all calls from LCSW [phone]). Follow Up Plan: Telephone follow up appointment with care management team member scheduled for: 01/06/2021 at 3:00pm.

## 2021-01-04 NOTE — Chronic Care Management (AMB) (Signed)
Chronic Care Management    Clinical Social Work Note  01/04/2021 Name: Tyler Brewer MRN: [id number] DOB: [birth year]  Tyler Brewer is a 60 y.o. year old male who is a primary care patient of [name], DO. The CCM team was consulted to assist the patient with chronic disease management and/or care coordination needs related to: Level of Care Concerns in Patient with History of Falls/Fall Risk, Unsteady Gait, Impaired Mobility, Weakness, Tremor, Syncope, Balance Problem and Lower Extremity Edema.   Engaged with patient by telephone for initial visit in response to provider referral for social work chronic care management and care coordination services.   Consent to Services:  The patient was given information about Chronic Care Management services, agreed to services, and gave verbal consent prior to initiation of services.  Please see initial visit note for detailed documentation.   Patient agreed to services and consent obtained.   Assessment: Review of patient past medical history, allergies, medications, and health status, including review of relevant consultants reports was performed today as part of a comprehensive evaluation and provision of chronic care management and care coordination services.     SDOH (Social Determinants of Health) assessments and interventions performed:  SDOH Interventions   Flowsheet Row Most Recent Value  SDOH Interventions   Depression Interventions/Treatment  Referral to Psychiatry, Medication, Counseling       Advanced Directives Status: Not addressed in this encounter.  CCM Care Plan  No Known Allergies  Outpatient Encounter Medications as of 01/04/2021  Medication Sig  . acetaminophen (TYLENOL) 500 MG tablet Take 500 mg by mouth every 6 (six) hours as needed. States he is taking one to two tablets a Brewer  . amLODipine (NORVASC) 10 MG tablet Take 1 tablet (10 mg total) by mouth daily.  . Chlorpheniramine Maleate (ALLERGY RELIEF PO)  Take 1 tablet by mouth daily as needed (allergies).  . diclofenac Sodium (VOLTAREN) 1 % GEL Apply 2 g topically 4 (four) times daily.  . DULoxetine (CYMBALTA) 30 MG capsule Take 1 capsule (30 mg total) by mouth daily. (Patient taking differently: Take 30 mg by mouth 3 (three) times a week.)  . furosemide (LASIX) 20 MG tablet TAKE 1 TABLET(20 MG) BY MOUTH DAILY  . gabapentin (NEURONTIN) 300 MG capsule Take 2 tablets in the morning, 1 tablet in the afternoon, and 1 tablet at bedtime. (Patient taking differently: Take 300-600 mg by mouth See admin instructions. Take 600 mg in the morning, 300 mg in the afternoon and at bedtime)  . hydrALAZINE (APRESOLINE) 50 MG tablet Take 1 tablet (50 mg total) by mouth every 8 (eight) hours.  [name] Kitchen ibuprofen (ADVIL) 200 MG tablet Take 200 mg by mouth every 6 (six) hours as needed.  . insulin aspart (NOVOLOG FLEXPEN) 100 UNIT/ML FlexPen Inject 12 Units into the skin 3 (three) times daily with meals.  . insulin degludec (TRESIBA FLEXTOUCH) 200 UNIT/ML FlexTouch Pen Inject 46 Units into the skin daily.  . Insulin Pen Needle 31G X 5 MM MISC 1 Device by Does not apply route in the morning, at noon, in the evening, and at bedtime.  . Lidocaine HCl 1 % GEL Apply 1 application topically as needed. Reports lidocaine cream 1% 2-3 times/Brewer to lower back/knee and to hip occasionally.  [name] Kitchen losartan (COZAAR) 100 MG tablet Take 1 tablet (100 mg total) by mouth daily.  . Menthol, Topical Analgesic, (ICY HOT EX) Apply topically as needed.  . metFORMIN (GLUCOPHAGE-XR) 500 MG 24 hr tablet Take 2 tablets (1,000  mg total) by mouth in the morning and at bedtime.  . methocarbamol (ROBAXIN) 500 MG tablet Take 1 tablet (500 mg total) by mouth 4 (four) times daily. (Patient taking differently: Take 500 mg by mouth every 6 (six) hours as needed for muscle spasms.)  . pravastatin (PRAVACHOL) 40 MG tablet TAKE ONE TABLET BY MOUTH ONE TIME DAILY  . tamsulosin (FLOMAX) 0.4 MG CAPS capsule Take 1 capsule  (0.4 mg total) by mouth daily.  [name] Kitchen tobramycin (TOBREX) 0.3 % ophthalmic ointment Place into the left eye 3 (three) times daily.  . traMADol (ULTRAM) 50 MG tablet TAKE ONE TO TWO TABLETS BY MOUTH EVERY 8 HOURS AS NEEDED FOR PAIN   Facility-Administered Encounter Medications as of 01/04/2021  Medication  . amLODipine (NORVASC) tablet 10 mg  . furosemide (LASIX) tablet 20 mg  . gabapentin (NEURONTIN) capsule 300 mg  . gabapentin (NEURONTIN) capsule 600 mg  . hydrALAZINE (APRESOLINE) tablet 50 mg  . insulin aspart (novoLOG) injection 12 Units  . insulin glargine (LANTUS) injection 45 Units  . losartan (COZAAR) tablet 100 mg  . metFORMIN (GLUCOPHAGE-XR) 24 hr tablet 1,000 mg  . tamsulosin (FLOMAX) capsule 0.4 mg  . traMADol (ULTRAM) tablet 50 mg    Patient Active Problem List   Diagnosis Date Noted  . Diabetes mellitus ([place]) 01/03/2021  . Type 2 diabetes mellitus with hyperglycemia, with long-term current use of insulin ([place]) 01/03/2021  . Lower extremity edema 11/28/2020  . Chronic low back pain 11/28/2020  . Psoriatic arthritis ([place]) 11/28/2020  . Thoracic aortic aneurysm without rupture ([place]) 11/28/2020  . Bilateral leg weakness 11/05/2020  . Acute left-sided low back pain with right-sided sciatica 07/19/2020  . Hip pain, acute, left 07/05/2020  . Tremor 07/05/2020  . Weakness 07/05/2020  . Balance problem 03/11/2020  . Tinea cruris 03/11/2020  . Cellulitis of left groin 03/11/2020  . Acute pain of right shoulder 03/11/2020  . Interstitial lung disease ([place]) 05/02/2017  . Pansinusitis 09/26/2016  . Diabetic polyneuropathy associated with diabetes mellitus due to underlying condition ([place]) 05/08/2016  . Methamphetamine use disorder, severe, dependence ([place]) 02/11/2016  . Substance induced mood disorder ([place]) 02/11/2016  . Exertional dyspnea 11/30/2015  . ADD (attention deficit disorder) 09/29/2015  . Binge eating 09/29/2015  . Syncope 05/05/2012  . Diarrhea 05/05/2012  .  Panic attacks 05/05/2012  . OSA (obstructive sleep apnea) 05/05/2012  . HTN (hypertension) 05/05/2012  . Dyslipidemia 05/05/2012  . CAD (coronary artery disease)  cardiac cath with moderate disease in a septal branch of the ramus intermedius 04/01/2012  . Chest pain 04/01/2012  . Drug abuse and dependence ([place]) 04/01/2012  . Family history of coronary artery disease 03/31/2012  . Sleep apnea, on C-pap 03/31/2012  . Hyperlipemia 03/30/2012  . HTN (hypertension), benign 03/30/2012  . Morbid obesity ([place]) 03/30/2012  . DM type 2, uncontrolled, with neuropathy ([place]) 03/30/2012  . Depression with suicidal ideation 03/30/2012    Conditions to be addressed/monitored:  Level of Care Concerns in Patient with History of Falls/Fall Risk, Unsteady Gait, Impaired Mobility, Weakness, Tremor, Syncope, Balance Problem and Lower Extremity Edema.  Limited Social Support, Housing Barriers, Level of Care Concerns, ADL/IADL Limitations, Social Isolation, Limited Access to Caregiver and Memory Deficits.  Care Plan : LCSW Plan of Care  Updates made by Francis Gaines, LCSW since 01/04/2021 12:00 AM    Problem: Receive Short-Term Rehabilitative Services in a [place].   Priority: High    Goal: Receive Short-Term Rehabilitative Services in a Skilled  Nursing Facility.   Start Date: 01/04/2021  Expected End Date: 01/17/2021  This Visit's Progress: On track  Priority: High  Note:   Current Barriers:   . Patient with History of Falls/Fall Risk, Unsteady Gait, Impaired Mobility, Weakness, Tremor, Syncope, Balance Problem and Lower Extremity Edema needs assistance with initiating short-term rehabilitative services in a skilled nursing facility. . Patient's health has declined and patient is no longer able to complete activities of daily living independently. . Patient acknowledges deficits with education and support in order to meet this need. . Patient does not attend all scheduled provider  appointments. . Patient does not adhere to prescribed medication regimen. . Patient lacks social connections. . Patient is experiencing financial constraints related to inability to pay for rent/housing. . Patient has limited social support. . Patient has level of care concerns. . Patient has mental health concerns. . Patient is experiencing family and relationship dysfunction. . Patient has limited access to caregiver. . Patient is experiencing memory deficits. Clinical Goal(s):  [name] Kitchen Over the next 30 to 45 days, patient will be placed in a skilled nursing facility to receive short-term rehabilitative services. . Patient will work with LCSW, as well as Home Health Social Worker, Insurance claims handler with [facility], to coordinate needs for short-term skilled nursing placement for rehabilitative services. Interventions: . Collaboration with Primary Care Physician, Dr. Seabron Spates regarding development and update of comprehensive plan of care as evidenced by provider attestation and co-signature. Rene Paci care team collaboration (see longitudinal plan of care). . Assessed needs and provided education on level of care and facility placement process. . Completed FL-2 Form and faxed to Primary Care Physician, Dr. Seabron Spates for review and signature. . Obtained patient's verbal consent to fax FL-2 Form to facilities of interest. . Obtained patient's verbal consent to initiate a PASSAR number. . Reviewed and initiated process to obtain PASSAR number. [name] Kitchen FL-2 Form faxed to [facility] and [facility], per patient's request. . Collaboration with identified facilities of interest Lacinda Axon and [facility]) to confirm receipt of completed and signed FL-2 Form. . Collaboration with Otila Kluver, Home Health Physical Therapist with [facility] - # [phone], to request home health physical therapy notes and  recommendations. . Collaboration with Ron Parker, Home Health Occupational Therapist with [facility] - # [phone], to request home health occupational therapy notes and recommendations. [name] Kitchen Collaboration with Misty Stanley, [facility] Social Worker with [facility] - # [phone], to request assistance with skilled nursing placement for patient. . Collaboration with Dr. Marianna Fuss, Attending Physician at [facility]) regarding request for rapid COVID-19 Screening. . Collaboration with representative from [employer] to try and obtain approval/prior authorization for short-term skilled nursing placement. . Faxed home health physical and occupational therapy notes and recommendations, along with completed and signed FL-2 Form to [facility] to try and obtain approval/prior authorization for short-term skilled nursing placement. . Patient interviewed and appropriate assessments performed. . Discussed plans with patient for ongoing care management follow up and provided patient with direct contact information for care management team. . Advised patient to answer all calls from LCSW, confirming contact information for LCSW. Patient Goals/Self-Care Activities: . Continue to work with home health nursing, physical therapist, occupational therapist and Child psychotherapist. [name] Kitchen Answer all calls from LCSW [phone]). Follow Up Plan: Telephone follow up appointment with care management team member scheduled for: 01/06/2021 at 3:00pm.  Follow Up Plan: 01/06/2021 at 3:00pm.      Nat Christen LCSW Licensed Clinical Social Worker [facility] [place] [phone]

## 2021-01-04 NOTE — Progress Notes (Signed)
.. Transition of Care [facility]) - Emergency Department Mini Assessment   Patient Details  Name: Tyler Brewer MRN: [id number] Date of Birth: 1960/10/30  Transition of Care [facility]) CM/SW Contact:    Lauriann Milillo C Tarpley-Carter, [place] Phone Number: 01/04/2021, 10:58 AM   Clinical Narrative: TOC CSW received a call in regards to pt seeking placement at a facility.  CSW explained to MD that the process for placement will be difficult due to pts age.  Facilities do not want the liability of someone his age at there facility which makes placement limited.  Therefore, placement can not be from the ED due to capacity in the ED.  CSW contacted [employer], LCSW [phone].  Di Kindle has been attempting to contact patient based on a referral from [name], DO.  Di Kindle has stated she will continue to contact pt to assist with placement.  Sadye Kiernan Tarpley-Carter, MSW, LCSW-A Pronouns:  She, Her, Hers                  [facility] ED Transitions of CareClinical Social Worker [email or web address] [phone]   ED Mini Assessment: What brought you to the Emergency Department? : Loss of Consciousness  Barriers to Discharge: No Barriers Identified     Means of departure: Not know  Interventions which prevented an admission or readmission: [place] or Services    Patient Contact and Communications Key Contact 1: Marlynn Perking   Spoke with: [phone] Contact Date: 01/04/21,       Call outcome: TOC CSW contacted Di Kindle in regards to her assistance with placement.  Patient states their goals for this hospitalization and ongoing recovery are:: Nursing facility placement.   Choice offered to / list presented to : NA  Admission diagnosis:  SYNCOPE Patient Active Problem List   Diagnosis Date Noted  . Diabetes mellitus ([place]) 01/03/2021  . Type 2 diabetes mellitus with hyperglycemia, with long-term current use of insulin ([place]) 01/03/2021  .  Lower extremity edema 11/28/2020  . Chronic low back pain 11/28/2020  . Psoriatic arthritis ([place]) 11/28/2020  . Thoracic aortic aneurysm without rupture ([place]) 11/28/2020  . Bilateral leg weakness 11/05/2020  . Acute left-sided low back pain with right-sided sciatica 07/19/2020  . Hip pain, acute, left 07/05/2020  . Tremor 07/05/2020  . Weakness 07/05/2020  . Balance problem 03/11/2020  . Tinea cruris 03/11/2020  . Cellulitis of left groin 03/11/2020  . Acute pain of right shoulder 03/11/2020  . Interstitial lung disease ([place]) 05/02/2017  . Pansinusitis 09/26/2016  . Diabetic polyneuropathy associated with diabetes mellitus due to underlying condition ([place]) 05/08/2016  . Methamphetamine use disorder, severe, dependence ([place]) 02/11/2016  . Substance induced mood disorder ([place]) 02/11/2016  . Exertional dyspnea 11/30/2015  . ADD (attention deficit disorder) 09/29/2015  . Binge eating 09/29/2015  . Syncope 05/05/2012  . Diarrhea 05/05/2012  . Panic attacks 05/05/2012  . OSA (obstructive sleep apnea) 05/05/2012  . HTN (hypertension) 05/05/2012  . Dyslipidemia 05/05/2012  . CAD (coronary artery disease)  cardiac cath with moderate disease in a septal branch of the ramus intermedius 04/01/2012  . Chest pain 04/01/2012  . Drug abuse and dependence ([place]) 04/01/2012  . Family history of coronary artery disease 03/31/2012  . Sleep apnea, on C-pap 03/31/2012  . Hyperlipemia 03/30/2012  . HTN (hypertension), benign 03/30/2012  . Morbid obesity ([place]) 03/30/2012  . DM type 2, uncontrolled, with neuropathy ([place]) 03/30/2012  . Depression with suicidal ideation 03/30/2012   PCP:  [name], DO  Pharmacy:   [id number] - [place], [place] - [address] [place] [address] Phone: [phone] Fax: [phone]  [facility] [address] - [place], [place] - [address] [place] [address], [address] [place] [address] Phone: [phone] Fax: [phone]

## 2021-01-04 NOTE — ED Notes (Signed)
Pt putting home Voltaren gel on his knees and back for pain, MD aware

## 2021-01-04 NOTE — ED Notes (Signed)
Checked on Pt and Pt had Rt arm bent with bp cuff on. I placed bp cuff on lt arm.

## 2021-01-04 NOTE — ED Notes (Signed)
Pt sts he would like his urine checked because it burns when he urinates. EDP notified

## 2021-01-04 NOTE — Progress Notes (Signed)
. Transition of Care [facility]) - Emergency Department Mini Assessment   Patient Details  Name: Tyler Brewer MRN: [id number] Date of Birth: [birth year]  Transition [facility] Care [facility]) CM/SW Contact:    Erenest Rasher, RN Phone Number: [phone] 01/04/2021, 2:08 PM   Clinical Narrative:  TOC CM received referral to assist with placement. Spoke to [employer], Lattie Haw and requested [facility] CSW to assist PCP's office with SNF placement. Pt is being followed by [facility] PT and OT. They have assessed pt and will send notes to the PCP's office to start outpt placement for SNF rehab. Spoke to [employer] CSW, Venetia Night and pt is requesting [employer]. She has been in contact with [place]. Explained the SNF will start auth for [facility] and their auth process for SNF is 3-5 days. Pt agreeable to dc to home and waiting placement from home. ED provider updated. Pt will dc home via PTAR.   ED Mini Assessment: What brought you to the Emergency Department? : fall  Barriers to Discharge: No Barriers Identified  Barrier interventions: contacted [facility] agency, orders added for [place] of departure: Ambulance  Interventions which prevented an admission or readmission: [place] or Services,SNF Placement    Patient Contact and Communications Key Contact 1: Marlynn Perking   Spoke with: [phone] Contact Date: 01/04/21,       Call outcome: TOC CSW contacted Di Kindle in regards to her assistance with placement.  Patient states their goals for this hospitalization and ongoing recovery are:: Nursing facility placement.   Choice offered to / list presented to : NA  Admission diagnosis:  SYNCOPE Patient Active Problem List   Diagnosis Date Noted  . Diabetes mellitus ([place]) 01/03/2021  . Type 2 diabetes mellitus with hyperglycemia, with long-term current use of insulin ([place]) 01/03/2021  . Lower extremity edema 11/28/2020  . Chronic low back pain 11/28/2020  . Psoriatic arthritis ([place])  11/28/2020  . Thoracic aortic aneurysm without rupture ([place]) 11/28/2020  . Bilateral leg weakness 11/05/2020  . Acute left-sided low back pain with right-sided sciatica 07/19/2020  . Hip pain, acute, left 07/05/2020  . Tremor 07/05/2020  . Weakness 07/05/2020  . Balance problem 03/11/2020  . Tinea cruris 03/11/2020  . Cellulitis of left groin 03/11/2020  . Acute pain of right shoulder 03/11/2020  . Interstitial lung disease ([place]) 05/02/2017  . Pansinusitis 09/26/2016  . Diabetic polyneuropathy associated with diabetes mellitus due to underlying condition ([place]) 05/08/2016  . Methamphetamine use disorder, severe, dependence ([place]) 02/11/2016  . Substance induced mood disorder ([place]) 02/11/2016  . Exertional dyspnea 11/30/2015  . ADD (attention deficit disorder) 09/29/2015  . Binge eating 09/29/2015  . Syncope 05/05/2012  . Diarrhea 05/05/2012  . Panic attacks 05/05/2012  . OSA (obstructive sleep apnea) 05/05/2012  . HTN (hypertension) 05/05/2012  . Dyslipidemia 05/05/2012  . CAD (coronary artery disease)  cardiac cath with moderate disease in a septal branch of the ramus intermedius 04/01/2012  . Chest pain 04/01/2012  . Drug abuse and dependence ([place]) 04/01/2012  . Family history of coronary artery disease 03/31/2012  . Sleep apnea, on C-pap 03/31/2012  . Hyperlipemia 03/30/2012  . HTN (hypertension), benign 03/30/2012  . Morbid obesity ([place]) 03/30/2012  . DM type 2, uncontrolled, with neuropathy ([place]) 03/30/2012  . Depression with suicidal ideation 03/30/2012   PCP:  [name], DO Pharmacy:   [facility] [id number] - [place], [place] - [address] [place] [address] [place] [address] Phone: [phone] Fax: [phone]  [facility] [address] - [place], [place] - [address] [place] [address] [place] [address] Phone: [phone] Fax:  [phone]

## 2021-01-04 NOTE — ED Notes (Signed)
OK per Dr Sedonia Small pt can have graham crackers and peanut butter.

## 2021-01-04 NOTE — ED Provider Notes (Signed)
Sign out note  60 y/o male with obesity, HLD, HTN, IBS, CAD, T2DM, OSA, chronic back pain, chronic inflammatory demyelinating polyneuropathy (CIPD), methamphetamine addiction.  Patient had near syncope and fall today.  Trauma work-up was negative.  Patient has a long history of chronic mobility issues and chronic pain.  Physical therapy in April had recommended SNF placement but patient left AMA.  At time of signout, transitions of care has been consulted to evaluate if patient would be eligible for SNF/IPR.  7:00 AM received sign out from [place]  9:30 AM reassessed patient, discussed case with our [facility] team. Unfortunately due to insurance, age, patient would likely be very difficult to place from ER and recommended pursuing this on out pt basis. Patient already has a SW from PCP but he had not been answering his phone calls.  Our social work team will reach out to his social work to try to coordinate care for him  10:30 AM discussed case with his neurologist, Dr. Posey Pronto.  Discussed symptoms and work-up today, she does not have any further recommendations on work-up or treatment today.  Suspects his current state is related to chronic deconditioning, chronic pain.  12:57 PM social work team has reached out to home health resources to initiate the process for further outpatient evaluation.  I have updated patient, he is agreeable to discharge home.  He does have friends available in the house to help in the interim.  Instructed on importance of f/u with his CM/SW team, PCP and his neurologist.  Patient appears comfortable and has stable vitals at present.  Believe he is appropriate for discharge and outpatient management.   Lucrezia Starch, MD 01/04/21 1302

## 2021-01-04 NOTE — ED Notes (Signed)
Pt given Grits and coffee for breakfast then taken to restroom in wheelchair, pt reports he is unable to walk.

## 2021-01-04 NOTE — Telephone Encounter (Signed)
  Care Management   Follow Up Note   01/04/2021 Name: Amun Stemm MRN: [id number] DOB: [birth year]   Referred by: [name], DO Reason for referral : The patient was referred to the case management team for assistance with care management and care coordination. Chronic Care Management in Patient with History of Falls/Fall Risk, Unsteady Gait, Impaired Mobility, Weakness, Tremor, Syncope, Balance Problem and Lower Extremity Edema. Unsuccessful Outreach Call Attempt to Patient.  LCSW received an incoming call from [employer], Transition of Care Licensed Clinical Social Worker with [place] ([place]) indicating that patient is requesting skilled nursing placement for short-term rehabilitative services, asking for LCSW's assistance with this process.  Mrs. Tarpley-Carter reported that patient is currently in the Emergency Department at [facility], refusing to leave without placement assistance.  LCSW made several attempts to try and contact patient this morning, but without success.  HIPAA compliant messages were left on voicemail for patient, as LCSW continues to await a return call.   LCSW also made an outreach attempt to patient's [place] with [place] (# [phone]), but without success.  LCSW left a HIPAA compliant message for Linus Orn, requesting that she return LCSW's call at her earliest convenience.  LCSW will need physical and occupational therapy notes and recommendations to submit to [facility] for prior authorization and approval for short-term skilled nursing placement for rehabilitative services.  LCSW will also need a completed and signed FL-2 Form.  LCSW requested that Mrs. Tarpley-Carter consult with patient's attending physician at [facility] to request physical and occupational therapy services.  Follow Up Plan: LCSW will continue to outreach to the  patient throughout the day today, in hopes of being able to assist with placement.  [place] Licensed Clinical Social Worker Aguilar Darden [phone]

## 2021-01-04 NOTE — Discharge Instructions (Addendum)
Please follow-up with your primary care office to discuss additional help with placement and home health services.  Please keep the appointment with your neurologist tomorrow.  If you have additional falls or other new concerns, come back to ER for reassessment.

## 2021-01-04 NOTE — NC FL2 (Signed)
[place] LEVEL OF CARE SCREENING TOOL     IDENTIFICATION  Patient Name: Tyler Brewer Birthdate: November 28, 1960 Sex: male Admission Date (Current Location): 01/03/2021  [facility] and [place] Number:  Herbalist and Address:         Provider Number: [id number] ([place].)  Attending Physician Name and Address:  Lucrezia Starch, MD  Relative Name and Phone Number:  Mother - Nathanyel Defenbaugh - # [phone]    Current Level of Care: Other (Comment) (Emergency Department) Recommended Level of Care: [place] Prior Approval Number: PASARR # [id number] (Expired 12/16/2020) - Reapplied on 01/04/2021  Date Approved/Denied:   PASRR Number:    Discharge Plan: SNF    Current Diagnoses: Patient Active Problem List   Diagnosis Date Noted  . Diabetes mellitus ([place]) 01/03/2021  . Type 2 diabetes mellitus with hyperglycemia, with long-term current use of insulin ([place]) 01/03/2021  . Lower extremity edema 11/28/2020  . Chronic low back pain 11/28/2020  . Psoriatic arthritis ([place]) 11/28/2020  . Thoracic aortic aneurysm without rupture ([place]) 11/28/2020  . Bilateral leg weakness 11/05/2020  . Acute left-sided low back pain with right-sided sciatica 07/19/2020  . Hip pain, acute, left 07/05/2020  . Tremor 07/05/2020  . Weakness 07/05/2020  . Balance problem 03/11/2020  . Tinea cruris 03/11/2020  . Cellulitis of left groin 03/11/2020  . Acute pain of right shoulder 03/11/2020  . Interstitial lung disease ([place]) 05/02/2017  . Pansinusitis 09/26/2016  . Diabetic polyneuropathy associated with diabetes mellitus due to underlying condition ([place]) 05/08/2016  . Methamphetamine use disorder, severe, dependence ([place]) 02/11/2016  . Substance induced mood disorder ([place]) 02/11/2016  . Exertional dyspnea 11/30/2015  . ADD (attention deficit disorder) 09/29/2015  . Binge eating 09/29/2015  . Syncope 05/05/2012  . Diarrhea 05/05/2012  . Panic  attacks 05/05/2012  . OSA (obstructive sleep apnea) 05/05/2012  . HTN (hypertension) 05/05/2012  . Dyslipidemia 05/05/2012  . CAD (coronary artery disease)  cardiac cath with moderate disease in a septal branch of the ramus intermedius 04/01/2012  . Chest pain 04/01/2012  . Drug abuse and dependence ([place]) 04/01/2012  . Family history of coronary artery disease 03/31/2012  . Sleep apnea, on C-pap 03/31/2012  . Hyperlipemia 03/30/2012  . HTN (hypertension), benign 03/30/2012  . Morbid obesity ([place]) 03/30/2012  . DM type 2, uncontrolled, with neuropathy ([employer]) 03/30/2012  . Depression with suicidal ideation 03/30/2012    Orientation RESPIRATION BLADDER Height & Weight     Self,Time,Situation,Place  Normal Continent Weight: 278 lb (126.1 kg) Height:  5\' 11"  (180.3 cm)  BEHAVIORAL SYMPTOMS/MOOD NEUROLOGICAL BOWEL NUTRITION STATUS      Continent Diet (Carb Modified; Heart Healthy; Diabetic Diet)  AMBULATORY STATUS COMMUNICATION OF NEEDS Skin   Extensive Assist Verbally Skin abrasions (Rash on Arms, Thighs and Groin area.)                       Personal Care Assistance Level of Assistance  Bathing,Dressing Bathing Assistance: Limited assistance   Dressing Assistance: Limited assistance     Functional Limitations Info  Sight Sight Info: Impaired        SPECIAL CARE FACTORS FREQUENCY  Diabetic urine testing,PT (By licensed PT),OT (By licensed OT)   Diabetic Urine Testing Frequency: Check blood sugars before every meal. PT Frequency: 5 x's per week OT Frequency: 5 x's per week            Contractures Contractures Info: Not present  Additional Factors Info  Insulin Sliding Scale,Psychotropic     Psychotropic Info: Cymbalta Insulin Sliding Scale Info: Check blood sugars before every meal.  See medication list for insulin administration.       Current Medications (01/04/2021):  This is the current hospital active medication list Current Facility-Administered  Medications  Medication Dose Route Frequency Provider Last Rate Last Admin  . amLODipine (NORVASC) tablet 10 mg  10 mg Oral Daily Maudie Flakes, MD   10 mg at 01/04/21 1036  . furosemide (LASIX) tablet 20 mg  20 mg Oral Daily Maudie Flakes, MD   20 mg at 01/04/21 1037  . gabapentin (NEURONTIN) capsule 300 mg  300 mg Oral BID Maudie Flakes, MD      . gabapentin (NEURONTIN) capsule 600 mg  600 mg Oral Q breakfast Maudie Flakes, MD   600 mg at 01/04/21 0824  . hydrALAZINE (APRESOLINE) tablet 50 mg  50 mg Oral Q8H Maudie Flakes, MD   50 mg at 01/04/21 0160  . insulin aspart (novoLOG) injection 12 Units  12 Units Subcutaneous TID WC Maudie Flakes, MD   12 Units at 01/04/21 1345  . insulin glargine (LANTUS) injection 45 Units  45 Units Subcutaneous Daily Maudie Flakes, MD   45 Units at 01/04/21 1057  . losartan (COZAAR) tablet 100 mg  100 mg Oral Daily Maudie Flakes, MD   100 mg at 01/04/21 1035  . metFORMIN (GLUCOPHAGE-XR) 24 hr tablet 1,000 mg  1,000 mg Oral Q breakfast Maudie Flakes, MD   1,000 mg at 01/04/21 1093  . tamsulosin (FLOMAX) capsule 0.4 mg  0.4 mg Oral Daily Maudie Flakes, MD   0.4 mg at 01/04/21 1037  . traMADol (ULTRAM) tablet 50 mg  50 mg Oral Q12H PRN Maudie Flakes, MD   50 mg at 01/04/21 2355   Current Outpatient Medications  Medication Sig Dispense Refill  . acetaminophen (TYLENOL) 500 MG tablet Take 500 mg by mouth every 6 (six) hours as needed. States he is taking one to two tablets a day    . amLODipine (NORVASC) 10 MG tablet Take 1 tablet (10 mg total) by mouth daily. 30 tablet 0  . Chlorpheniramine Maleate (ALLERGY RELIEF PO) Take 1 tablet by mouth daily as needed (allergies).    . diclofenac Sodium (VOLTAREN) 1 % GEL Apply 2 g topically 4 (four) times daily. 50 g 0  . DULoxetine (CYMBALTA) 30 MG capsule Take 1 capsule (30 mg total) by mouth daily. (Patient taking differently: Take 30 mg by mouth 3 (three) times a week.) 60 capsule 3  . furosemide  (LASIX) 20 MG tablet TAKE 1 TABLET(20 MG) BY MOUTH DAILY 90 tablet 0  . gabapentin (NEURONTIN) 300 MG capsule Take 2 tablets in the morning, 1 tablet in the afternoon, and 1 tablet at bedtime. (Patient taking differently: Take 300-600 mg by mouth See admin instructions. Take 600 mg in the morning, 300 mg in the afternoon and at bedtime) 120 capsule 5  . hydrALAZINE (APRESOLINE) 50 MG tablet Take 1 tablet (50 mg total) by mouth every 8 (eight) hours. 90 tablet 0  . ibuprofen (ADVIL) 200 MG tablet Take 200 mg by mouth every 6 (six) hours as needed.    . insulin aspart (NOVOLOG FLEXPEN) 100 UNIT/ML FlexPen Inject 12 Units into the skin 3 (three) times daily with meals. 30 mL 2  . insulin degludec (TRESIBA FLEXTOUCH) 200 UNIT/ML FlexTouch Pen Inject 46 Units into the skin  daily. 30 mL 2  . Insulin Pen Needle 31G X 5 MM MISC 1 Device by Does not apply route in the morning, at noon, in the evening, and at bedtime. 400 each 2  . Lidocaine HCl 1 % GEL Apply 1 application topically as needed. Reports lidocaine cream 1% 2-3 times/day to lower back/knee and to hip occasionally.    [name] Kitchen losartan (COZAAR) 100 MG tablet Take 1 tablet (100 mg total) by mouth daily. 90 tablet 3  . Menthol, Topical Analgesic, (ICY HOT EX) Apply topically as needed.    . metFORMIN (GLUCOPHAGE-XR) 500 MG 24 hr tablet Take 2 tablets (1,000 mg total) by mouth in the morning and at bedtime. 360 tablet 1  . methocarbamol (ROBAXIN) 500 MG tablet Take 1 tablet (500 mg total) by mouth 4 (four) times daily. (Patient taking differently: Take 500 mg by mouth every 6 (six) hours as needed for muscle spasms.) 45 tablet 1  . pravastatin (PRAVACHOL) 40 MG tablet TAKE ONE TABLET BY MOUTH ONE TIME DAILY 30 tablet 2  . tamsulosin (FLOMAX) 0.4 MG CAPS capsule Take 1 capsule (0.4 mg total) by mouth daily. 90 capsule 3  . tobramycin (TOBREX) 0.3 % ophthalmic ointment Place into the left eye 3 (three) times daily. 3.5 g 0  . traMADol (ULTRAM) 50 MG tablet TAKE  ONE TO TWO TABLETS BY MOUTH EVERY 8 HOURS AS NEEDED FOR PAIN 30 tablet 0     Discharge Medications: Please see discharge summary for a list of discharge medications.  Relevant Imaging Results:  Relevant Lab Results:   Additional Information Social Security # [id number]; Emergency Contact - Mother Hunt Zajicek) # [phone]; PASARR # [id number] (Expired 12/16/2020) - Reapplied  Akil Hoos, Marta Lamas, LCSW

## 2021-01-05 ENCOUNTER — Telehealth: Payer: Self-pay | Admitting: Family Medicine

## 2021-01-05 ENCOUNTER — Emergency Department (HOSPITAL_COMMUNITY)
Admission: EM | Admit: 2021-01-05 | Discharge: 2021-01-05 | Disposition: A | Discharge status: Home / Self Care | Payer: Medicare HMO | Attending: Emergency Medicine | Admitting: Emergency Medicine

## 2021-01-05 ENCOUNTER — Ambulatory Visit: Payer: Medicare HMO

## 2021-01-05 ENCOUNTER — Emergency Department (HOSPITAL_COMMUNITY): Payer: Medicare HMO

## 2021-01-05 ENCOUNTER — Other Ambulatory Visit: Payer: Self-pay

## 2021-01-05 ENCOUNTER — Ambulatory Visit: Payer: Medicare HMO | Admitting: Neurology

## 2021-01-05 ENCOUNTER — Encounter (HOSPITAL_COMMUNITY): Payer: Self-pay | Admitting: *Deleted

## 2021-01-05 DIAGNOSIS — I10 Essential (primary) hypertension: Secondary | ICD-10-CM | POA: Insufficient documentation

## 2021-01-05 DIAGNOSIS — M546 Pain in thoracic spine: Secondary | ICD-10-CM | POA: Diagnosis not present

## 2021-01-05 DIAGNOSIS — M545 Low back pain, unspecified: Secondary | ICD-10-CM | POA: Diagnosis not present

## 2021-01-05 DIAGNOSIS — F17210 Nicotine dependence, cigarettes, uncomplicated: Secondary | ICD-10-CM | POA: Diagnosis not present

## 2021-01-05 DIAGNOSIS — Z951 Presence of aortocoronary bypass graft: Secondary | ICD-10-CM | POA: Insufficient documentation

## 2021-01-05 DIAGNOSIS — Z794 Long term (current) use of insulin: Secondary | ICD-10-CM | POA: Diagnosis not present

## 2021-01-05 DIAGNOSIS — E1136 Type 2 diabetes mellitus with diabetic cataract: Secondary | ICD-10-CM | POA: Diagnosis not present

## 2021-01-05 DIAGNOSIS — M549 Dorsalgia, unspecified: Secondary | ICD-10-CM | POA: Diagnosis not present

## 2021-01-05 DIAGNOSIS — E1140 Type 2 diabetes mellitus with diabetic neuropathy, unspecified: Secondary | ICD-10-CM | POA: Insufficient documentation

## 2021-01-05 DIAGNOSIS — J849 Interstitial pulmonary disease, unspecified: Secondary | ICD-10-CM | POA: Diagnosis not present

## 2021-01-05 DIAGNOSIS — R2689 Other abnormalities of gait and mobility: Secondary | ICD-10-CM

## 2021-01-05 DIAGNOSIS — G4733 Obstructive sleep apnea (adult) (pediatric): Secondary | ICD-10-CM | POA: Diagnosis not present

## 2021-01-05 DIAGNOSIS — R531 Weakness: Secondary | ICD-10-CM

## 2021-01-05 DIAGNOSIS — G8929 Other chronic pain: Secondary | ICD-10-CM | POA: Diagnosis not present

## 2021-01-05 DIAGNOSIS — E1142 Type 2 diabetes mellitus with diabetic polyneuropathy: Secondary | ICD-10-CM | POA: Diagnosis not present

## 2021-01-05 DIAGNOSIS — Z7984 Long term (current) use of oral hypoglycemic drugs: Secondary | ICD-10-CM | POA: Insufficient documentation

## 2021-01-05 DIAGNOSIS — R69 Illness, unspecified: Secondary | ICD-10-CM | POA: Diagnosis not present

## 2021-01-05 DIAGNOSIS — E0842 Diabetes mellitus due to underlying condition with diabetic polyneuropathy: Secondary | ICD-10-CM

## 2021-01-05 DIAGNOSIS — M25552 Pain in left hip: Secondary | ICD-10-CM

## 2021-01-05 DIAGNOSIS — E1121 Type 2 diabetes mellitus with diabetic nephropathy: Secondary | ICD-10-CM | POA: Diagnosis not present

## 2021-01-05 DIAGNOSIS — M5441 Lumbago with sciatica, right side: Secondary | ICD-10-CM | POA: Diagnosis not present

## 2021-01-05 DIAGNOSIS — R251 Tremor, unspecified: Secondary | ICD-10-CM

## 2021-01-05 DIAGNOSIS — R600 Localized edema: Secondary | ICD-10-CM

## 2021-01-05 DIAGNOSIS — I2510 Atherosclerotic heart disease of native coronary artery without angina pectoris: Secondary | ICD-10-CM | POA: Diagnosis not present

## 2021-01-05 DIAGNOSIS — E7800 Pure hypercholesterolemia, unspecified: Secondary | ICD-10-CM | POA: Diagnosis not present

## 2021-01-05 DIAGNOSIS — Z79899 Other long term (current) drug therapy: Secondary | ICD-10-CM | POA: Insufficient documentation

## 2021-01-05 DIAGNOSIS — R29898 Other symptoms and signs involving the musculoskeletal system: Secondary | ICD-10-CM

## 2021-01-05 DIAGNOSIS — Z743 Need for continuous supervision: Secondary | ICD-10-CM | POA: Diagnosis not present

## 2021-01-05 LAB — URINE CULTURE: Culture: NO GROWTH

## 2021-01-05 IMAGING — CR DG LUMBAR SPINE COMPLETE 4+V
5 series · 5 of 5 positions shown · non-contrast
Comparison: None.

CLINICAL DATA: Lower back pain.

EXAM:
LUMBAR SPINE - COMPLETE 4+ VIEW

[l-spine ap]
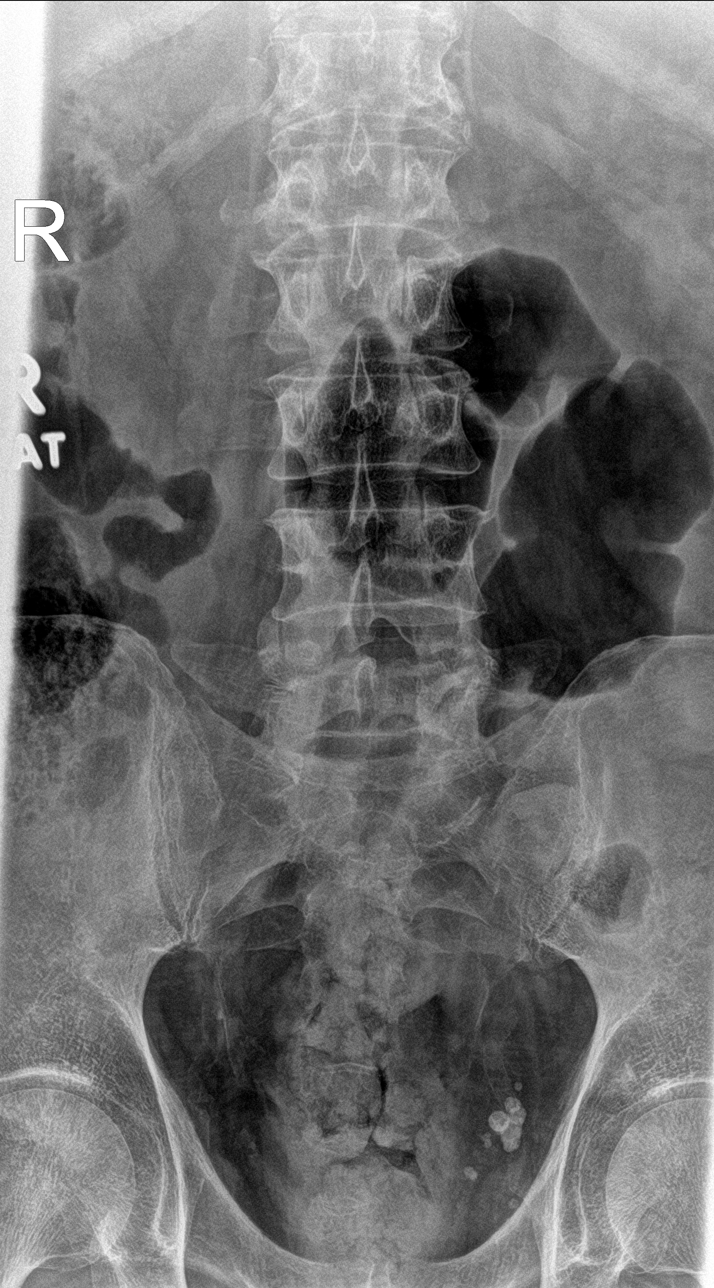

[l-spine obl (1 of 2)]
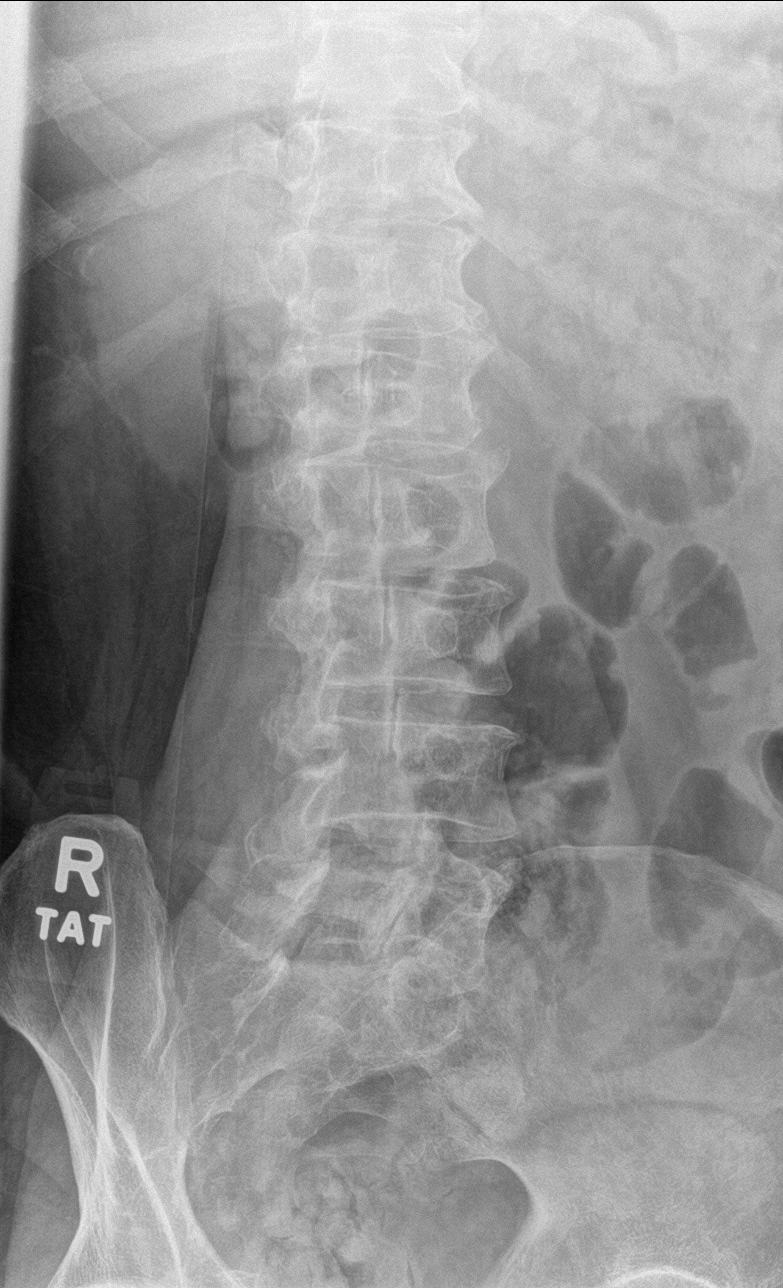

[l-spine lat]
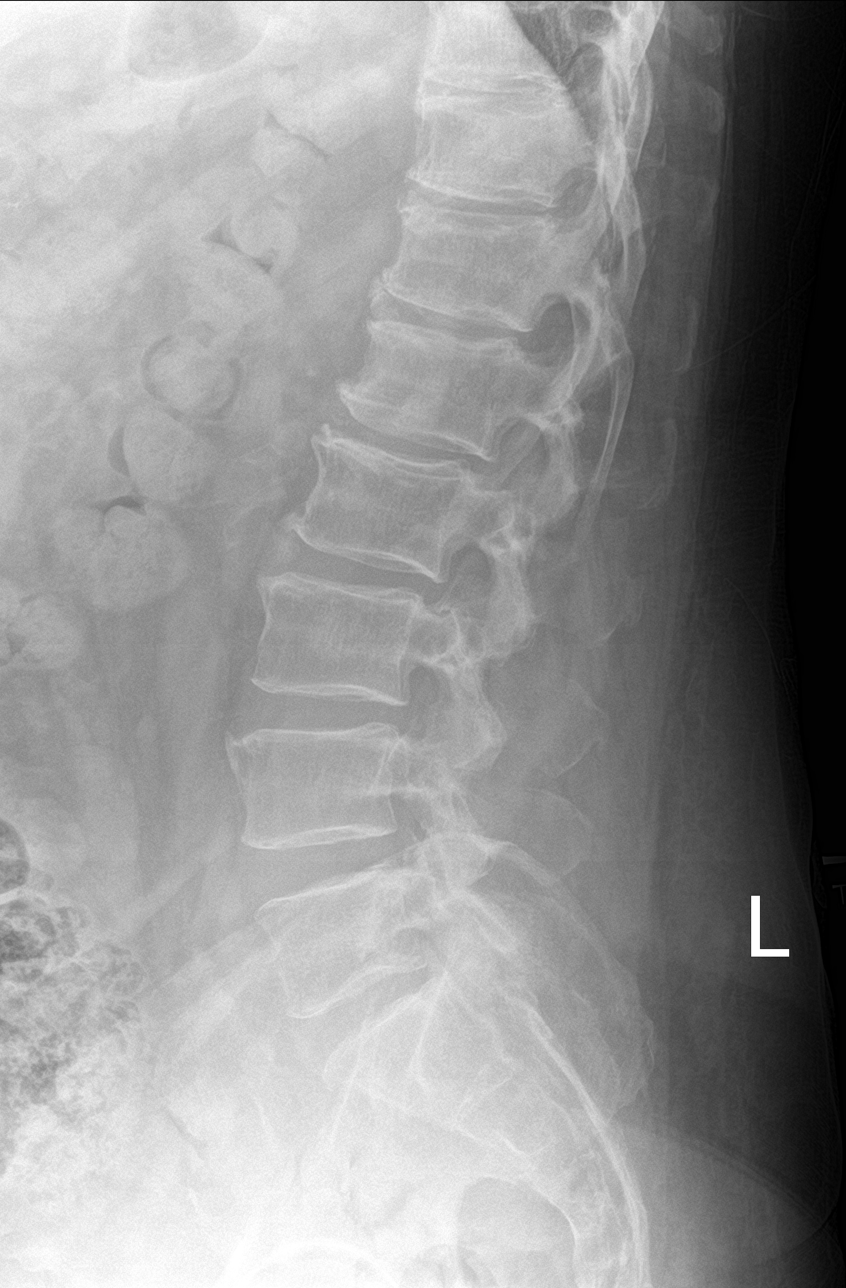

[l-spine spot]
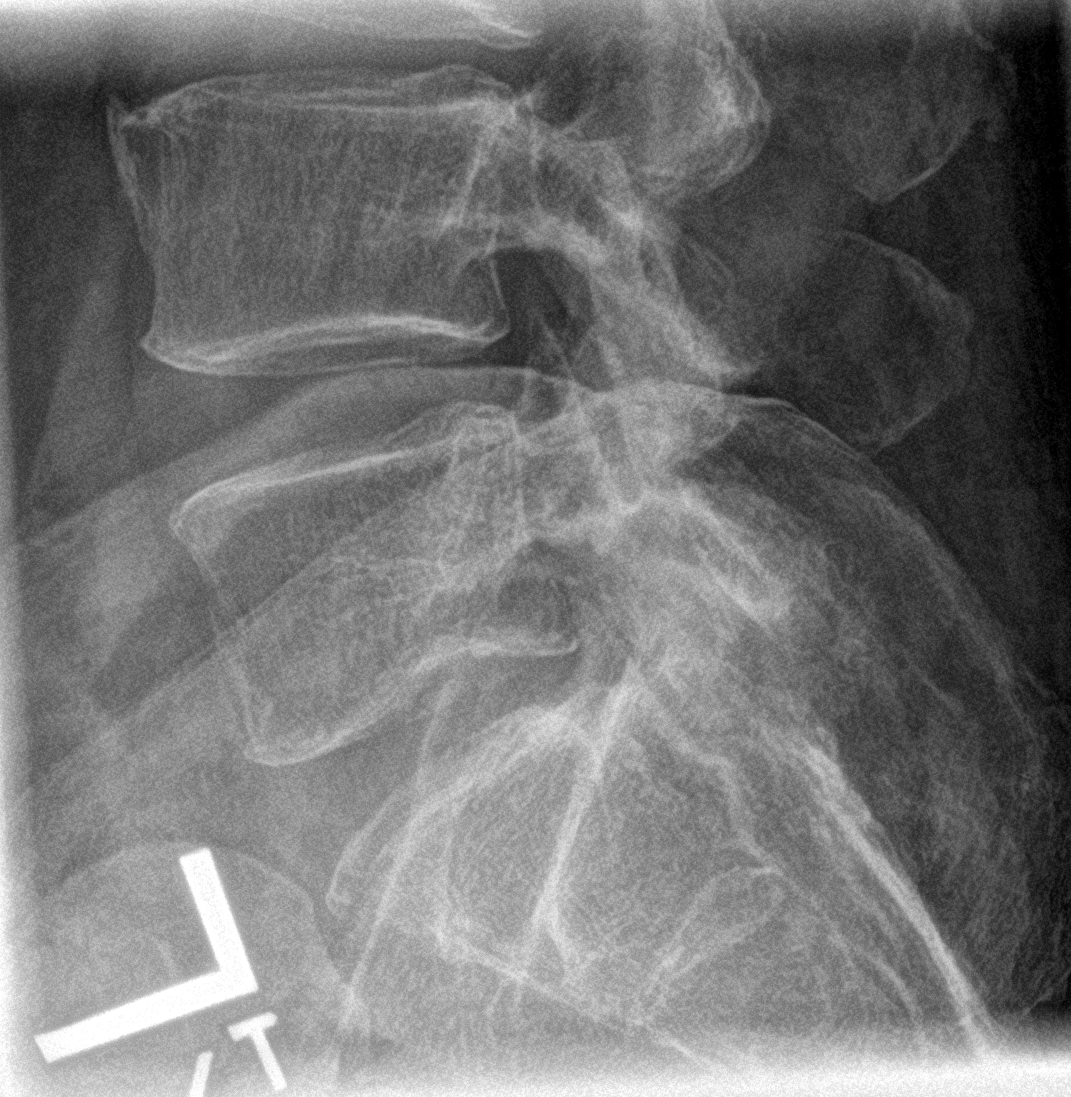

[l-spine obl (2 of 2)]
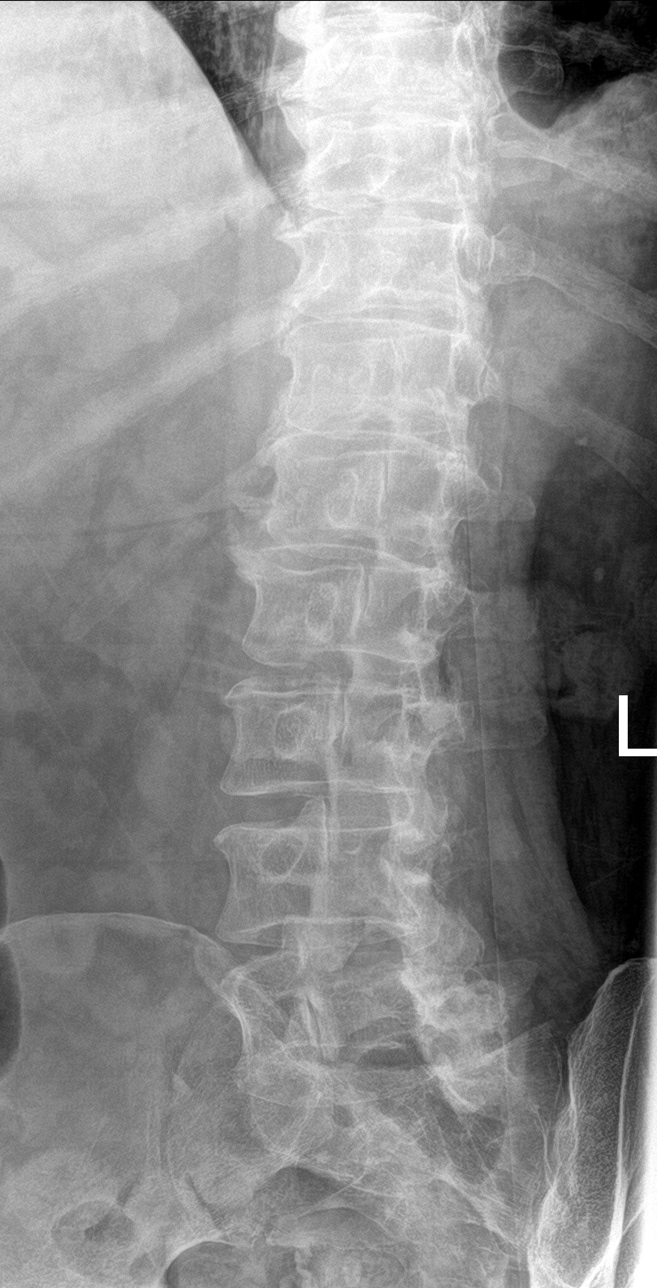

[5 of 5 positions shown; findings below may reference images not displayed]

FINDINGS: Minimal grade 1 retrolisthesis L1-2 is noted secondary to mild
degenerative disc disease at this level. Mild anterior osteophyte
formation is also noted at L2-3 and L3-4. No fracture is noted.
Degenerative changes are seen involving posterior facet joints of
L4-5 and L5-S1.
IMPRESSION: Multilevel degenerative changes. No acute abnormality seen in the
lumbar spine.

## 2021-01-05 IMAGING — CR DG THORACIC SPINE 2V
3 series · 3 of 3 positions shown · non-contrast
Comparison: None.

CLINICAL DATA: Upper back pain.

EXAM:
THORACIC SPINE 2 VIEWS

[t-spine ap]
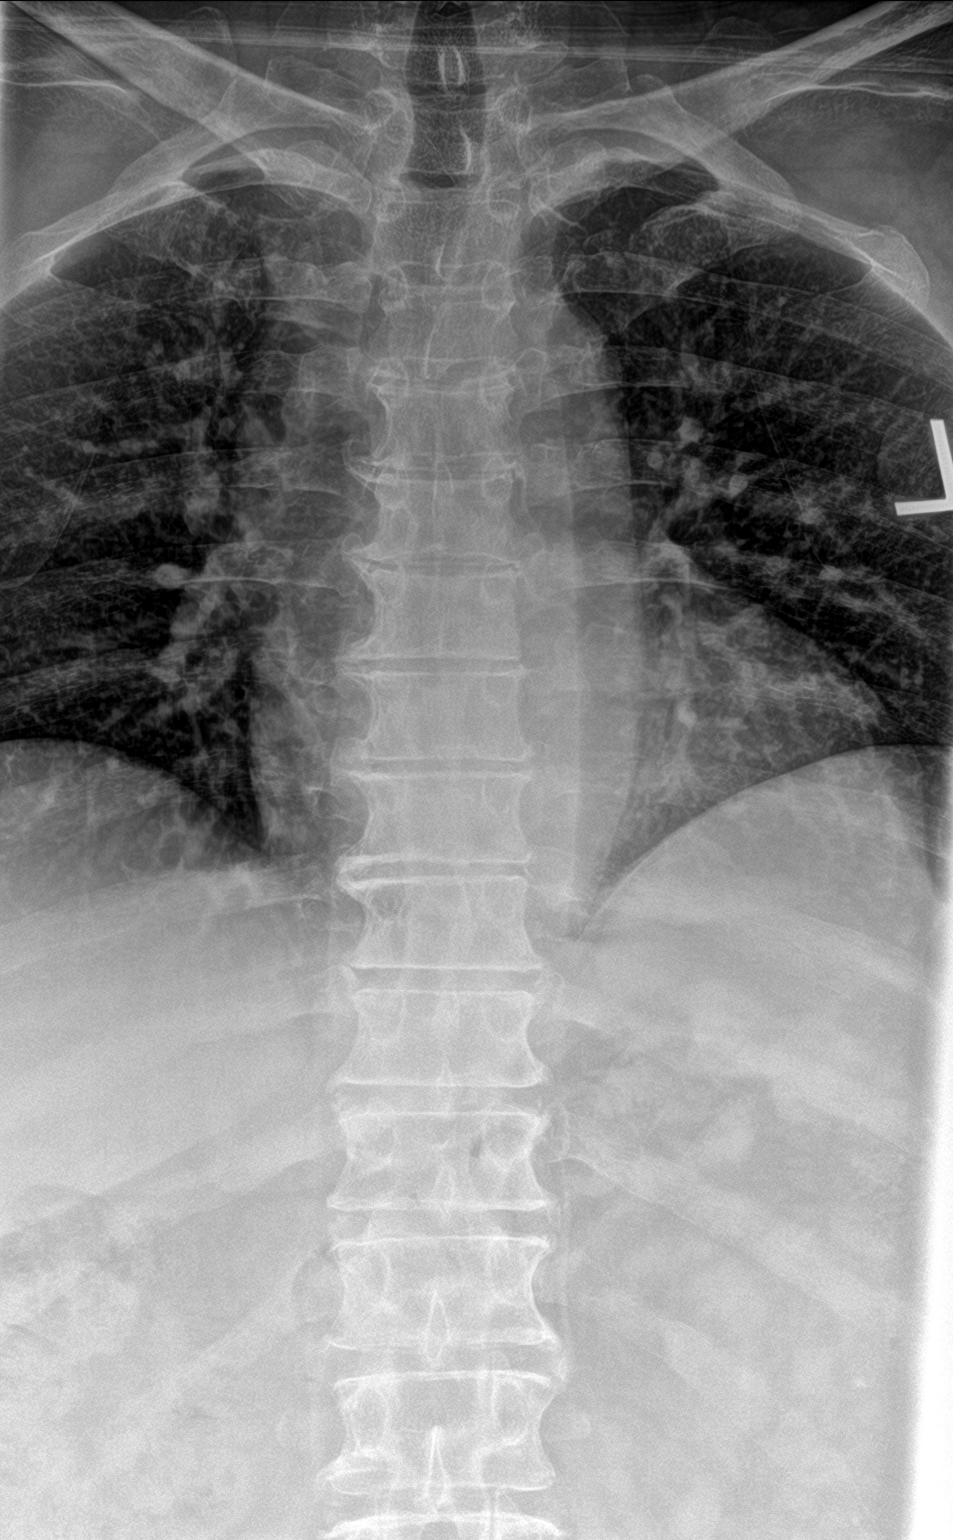

[t-spine lat]
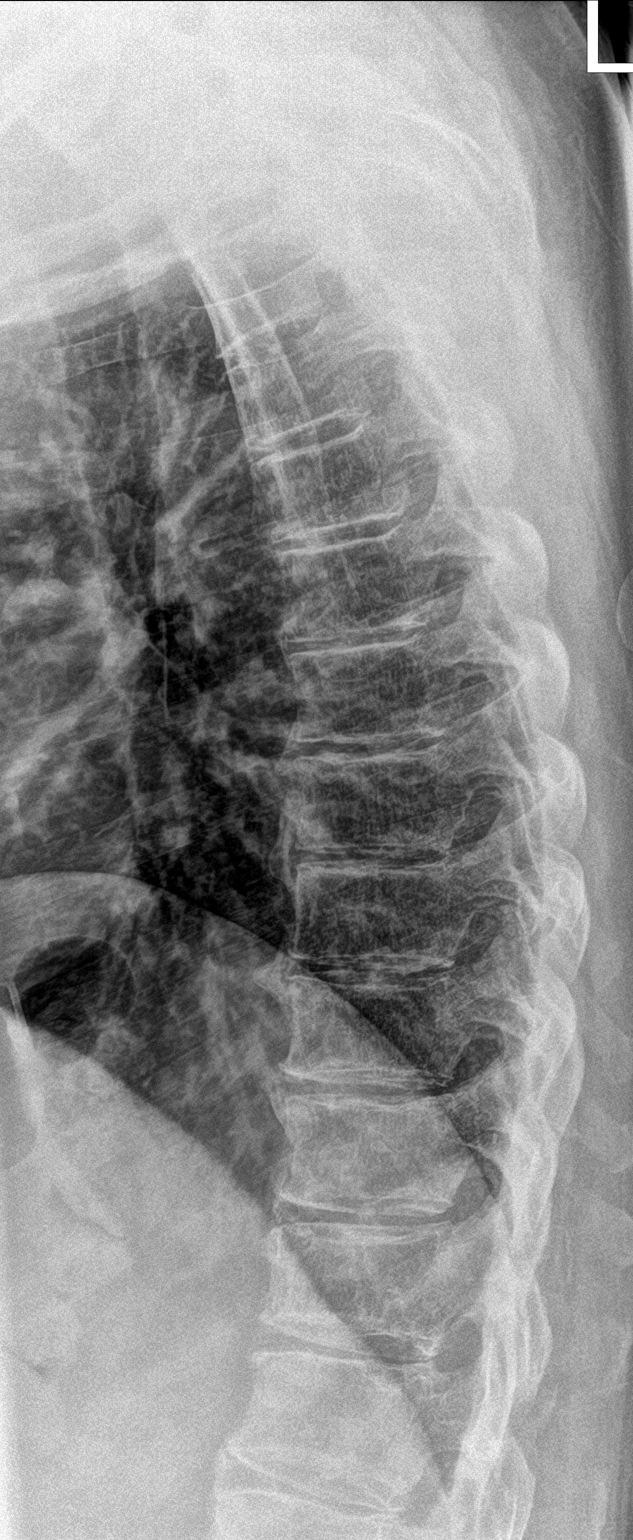

[t-spine swimmers]
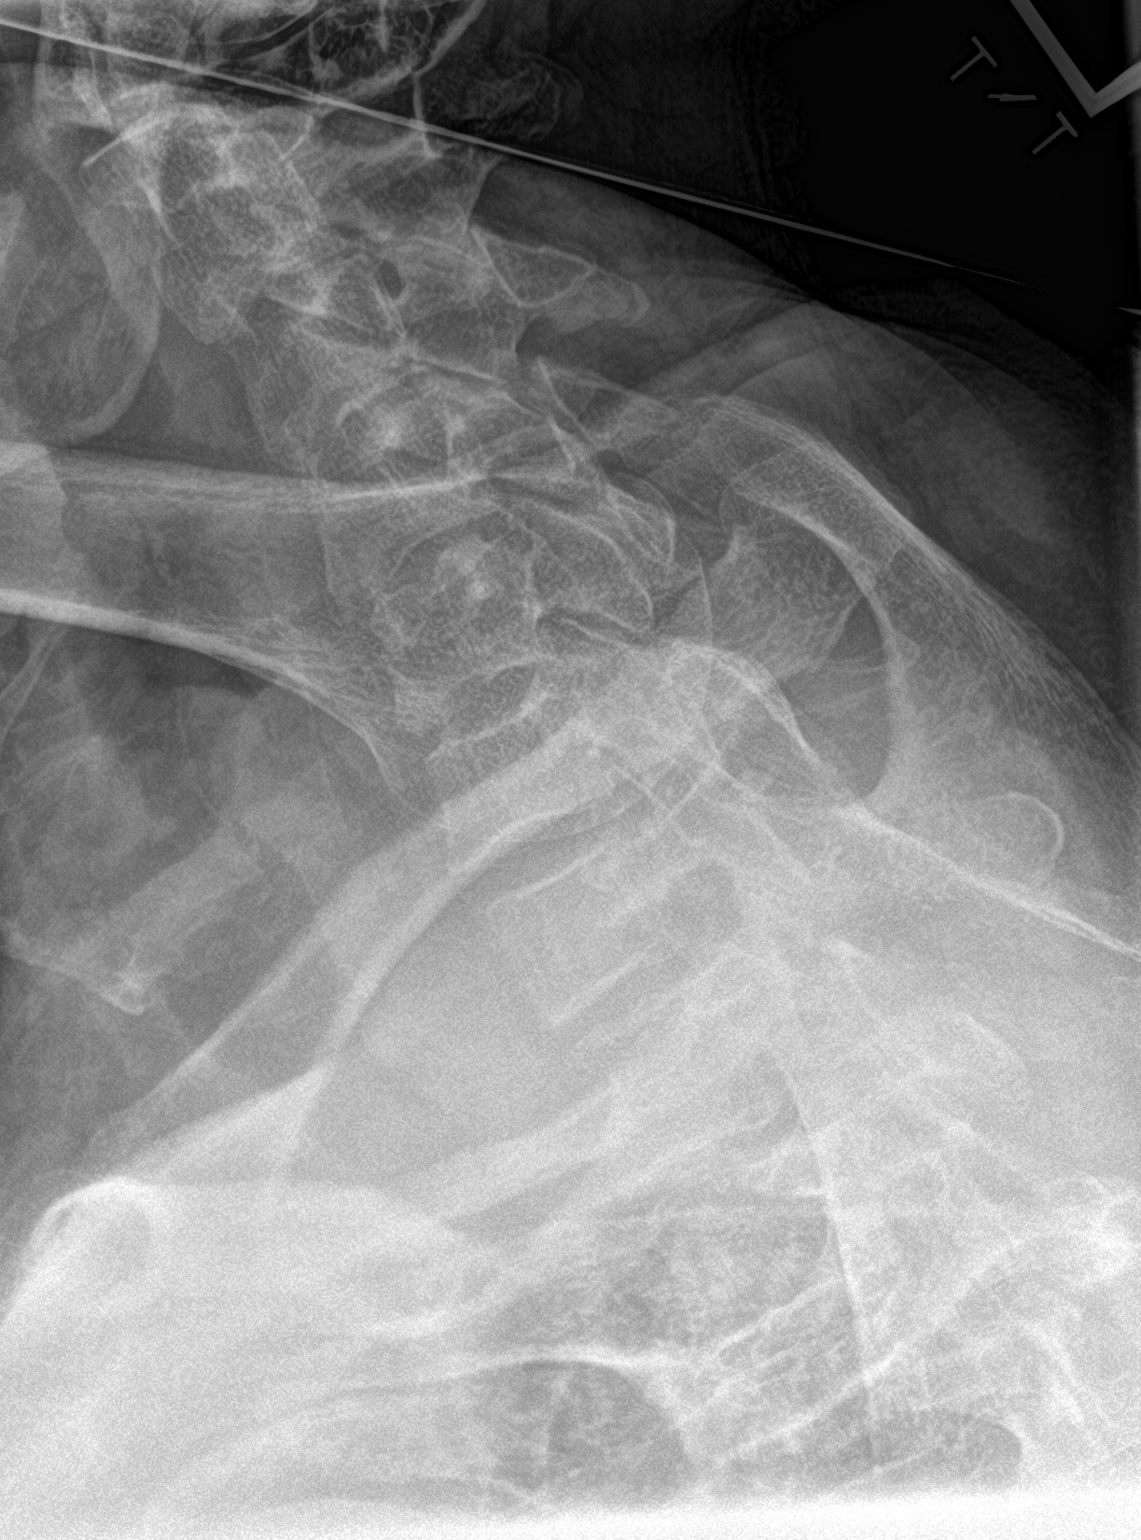

[3 of 3 positions shown; findings below may reference images not displayed]

FINDINGS: There is no evidence of thoracic spine fracture. Alignment is
normal. No other significant bone abnormalities are identified.
IMPRESSION: Negative.

## 2021-01-05 MED ORDER — HYDROMORPHONE HCL 1 MG/ML IJ SOLN
2.0000 mg | Freq: Once | INTRAMUSCULAR | Status: AC
Start: 2021-01-05 — End: 2021-01-05
  Administered 2021-01-05: 2 mg via INTRAMUSCULAR
  Filled 2021-01-05: qty 2

## 2021-01-05 MED ORDER — HYDROCODONE-ACETAMINOPHEN 5-325 MG PO TABS
2.0000 | ORAL_TABLET | Freq: Once | ORAL | Status: AC
  Administered 2021-01-05: 2 via ORAL
  Filled 2021-01-05: qty 2

## 2021-01-05 NOTE — Discharge Instructions (Signed)
If you develop worsening, recurrent, or continued back pain, numbness or weakness in the legs, incontinence of your bowels or bladders, numbness of your buttocks, fever, abdominal pain, or any other new/concerning symptoms then return to the ER for evaluation.  

## 2021-01-05 NOTE — Patient Instructions (Signed)
Visit Information  PATIENT GOALS: Goals Addressed              This Visit's Progress   .  Receive Short-Term Rehabilitative Services in a [place]. (pt-stated)   On track     Timeframe:  Short-Range Goal Priority:  High Start Date:  01/04/2021                          Expected End Date: 01/17/2021  Follow-Up Plan:  01/06/2021 at 3:00pm.  Patient Goals/Self-Care Activities: . Continue to work with home health nursing, physical therapist, occupational therapist and social worker with [facility]. . Answer all calls from LCSW [phone]). [name] Kitchen FL-2 Form, Home Health Physical/Occupational Therapy Notes and Recommendations, History & Physical, Demographics Sheet, Medication List, Chest X-Ray Results and [facility] Insurance have all been faxed to [facility] and [place] Admissions Department for review. [name] Kitchen LCSW contacted the Admissions Department at [facility] and [place] to confirm receipt of all information faxed to them by LCSW. [name] Kitchen FL-2 Form, Home Health Physical/Occupational Therapy Notes and Recommendations, History & Physical, Demographics Sheet, Medication List and Chest X-Ray Results have all been faxed to [place] Department for authorization and prior approval of short-term skilled nursing placement for rehabilitative services. [name] Kitchen LCSW contacted [facility] Claims Department to confirm receipt of all information faxed to them by LCSW. [name] Kitchen Awaiting authorization and prior approval from [facility]. [name] Kitchen Awaiting bed offer from [facility] and [place].       Patient verbalizes understanding of instructions provided today and agrees to view in [place].   Telephone follow up appointment with care management team member scheduled for: 01/06/2021 at 3:00pm.  Nat Christen [place] Licensed Clinical Social Worker [facility] [place] [phone]

## 2021-01-05 NOTE — ED Provider Notes (Signed)
[facility] EMERGENCY DEPARTMENT Provider Note   CSN: [id number] Arrival date & time: 01/05/21  1652     History Chief Complaint  Patient presents with  . Back Pain    Davarian Lohse is a 60 y.o. male.  HPI 60 year old male presents with new/worsening back pain.  Ongoing for a couple days.  Started prior to his fall for which she was seen at an outside hospital.  He has been having right-sided back pain which is not typical for him.  Pain is going down his leg and numbness in his right leg.  His leg feels weak.  He is able to walk but it is difficult.  The pain is currently severe.  There is no incontinence or fevers.  He also feels like there is swelling in his groin.  Past Medical History:  Diagnosis Date  . Arthritis   . CAD (coronary artery disease)  cardiac cath with moderate disease in a septal branch of the ramus intermedius 04/01/2012  . Depression   . Diabetes mellitus    poorly controlled by his report  . History of narcotic addiction ([place])    past history of back pain  . Hypercholesteremia   . Hypertension   . IBS (irritable bowel syndrome)   . 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[place])   . Neuropathy   . Obesity    Max weight was 390  . OSA on CPAP   . Panic attacks   . Testosterone deficiency   . Vertigo     Patient Active Problem List   Diagnosis Date Noted  . Diabetes mellitus ([place]) 01/03/2021  . Type 2 diabetes mellitus with hyperglycemia, with long-term current use of insulin ([place]) 01/03/2021  . Lower extremity edema 11/28/2020  . Chronic low back pain 11/28/2020  . Psoriatic arthritis ([place]) 11/28/2020  . Thoracic aortic aneurysm without rupture ([place]) 11/28/2020  . Bilateral leg weakness 11/05/2020  . Acute left-sided low back pain with right-sided sciatica 07/19/2020  . Hip pain, acute, left 07/05/2020  . Tremor 07/05/2020  . Weakness 07/05/2020  . Balance problem 03/11/2020  . Tinea cruris 03/11/2020  . Cellulitis of left groin  03/11/2020  . Acute pain of right shoulder 03/11/2020  . Interstitial lung disease ([place]) 05/02/2017  . Pansinusitis 09/26/2016  . Diabetic polyneuropathy associated with diabetes mellitus due to underlying condition ([place]) 05/08/2016  . Methamphetamine use disorder, severe, dependence ([place]) 02/11/2016  . Substance induced mood disorder ([place]) 02/11/2016  . Exertional dyspnea 11/30/2015  . ADD (attention deficit disorder) 09/29/2015  . Binge eating 09/29/2015  . Syncope 05/05/2012  . Diarrhea 05/05/2012  . Panic attacks 05/05/2012  . OSA (obstructive sleep apnea) 05/05/2012  . HTN (hypertension) 05/05/2012  . Dyslipidemia 05/05/2012  . CAD (coronary artery disease)  cardiac cath with moderate disease in a septal branch of the ramus intermedius 04/01/2012  . Chest pain 04/01/2012  . Drug abuse and dependence ([place]) 04/01/2012  . Family history of coronary artery disease 03/31/2012  . Sleep apnea, on C-pap 03/31/2012  . Hyperlipemia 03/30/2012  . HTN (hypertension), benign 03/30/2012  . Morbid obesity ([place]) 03/30/2012  . DM type 2, uncontrolled, with neuropathy ([place]) 03/30/2012  . Depression with suicidal ideation 03/30/2012    Past Surgical History:  Procedure Laterality Date  . CARDIAC CATHETERIZATION    . IR FLUORO GUIDE CV LINE RIGHT  11/10/2020  . IR US GUIDE VASC ACCESS RIGHT  11/10/2020  . LEFT HEART CATHETERIZATION WITH CORONARY ANGIOGRAM N/A 03/31/2012   Procedure: LEFT HEART  CATHETERIZATION WITH CORONARY ANGIOGRAM;  Surgeon: Leonie Man, MD;  Location: [facility] CATH LAB;  Service: Cardiovascular;  Laterality: N/A;       Family History  Problem Relation Age of Onset  . Diabetes type II Father   . Hypertension Father   . Pancreatic disease Father 41       Deceased  . Healthy Mother   . Healthy Sister   . Healthy Son   . Healthy Daughter   . Parkinson's disease Maternal Grandmother     Social History   Tobacco Use  . Smoking status: Current Every Day Smoker     Packs/day: 0.50    Types: Cigarettes    Last attempt to quit: 09/29/2020    Years since quitting: 0.2  . Smokeless tobacco: Never Used  Vaping Use  . Vaping Use: Never used  Substance Use Topics  . Alcohol use: No    Alcohol/week: 0.0 standard drinks  . Drug use: Not Currently    Types: Methamphetamines    Home Medications Prior to Admission medications   Medication Sig Start Date End Date Taking? Authorizing Provider  acetaminophen (TYLENOL) 500 MG tablet Take 500 mg by mouth every 6 (six) hours as needed. States he is taking one to two tablets a day    [provider]  amLODipine (NORVASC) 10 MG tablet Take 1 tablet (10 mg total) by mouth daily. 11/21/20   Thurnell Lose, MD  Chlorpheniramine Maleate (ALLERGY RELIEF PO) Take 1 tablet by mouth daily as needed (allergies).    [provider]  diclofenac Sodium (VOLTAREN) 1 % GEL Apply 2 g topically 4 (four) times daily. 08/27/20   Corena Herter, PA-C  DULoxetine (CYMBALTA) 30 MG capsule Take 1 capsule (30 mg total) by mouth daily. Patient taking differently: Take 30 mg by mouth 3 (three) times a week. 07/19/20   [name] R, DO  furosemide (LASIX) 20 MG tablet TAKE 1 TABLET(20 MG) BY MOUTH DAILY 12/21/20   Carollee Herter, Alferd Apa, DO  gabapentin (NEURONTIN) 300 MG capsule Take 2 tablets in the morning, 1 tablet in the afternoon, and 1 tablet at bedtime. Patient taking differently: Take 300-600 mg by mouth See admin instructions. Take 600 mg in the morning, 300 mg in the afternoon and at bedtime 10/27/20   Narda Amber K, DO  hydrALAZINE (APRESOLINE) 50 MG tablet Take 1 tablet (50 mg total) by mouth every 8 (eight) hours. 11/21/20   Thurnell Lose, MD  ibuprofen (ADVIL) 200 MG tablet Take 200 mg by mouth every 6 (six) hours as needed.    [provider]  insulin aspart (NOVOLOG FLEXPEN) 100 UNIT/ML FlexPen Inject 12 Units into the skin 3 (three) times daily with meals. 01/03/21   [name], Melanie Crazier, MD  insulin degludec (TRESIBA FLEXTOUCH) 200 UNIT/ML FlexTouch Pen Inject 46 Units into the skin daily. 01/03/21   [name], Melanie Crazier, MD  Insulin Pen Needle 31G X 5 MM MISC 1 Device by Does not apply route in the morning, at noon, in the evening, and at bedtime. 01/03/21   [name], Melanie Crazier, MD  Lidocaine HCl 1 % GEL Apply 1 application topically as needed. Reports lidocaine cream 1% 2-3 times/day to lower back/knee and to hip occasionally.    [provider]  losartan (COZAAR) 100 MG tablet Take 1 tablet (100 mg total) by mouth daily. 07/05/20   [name], DO  Menthol, Topical Analgesic, (ICY HOT EX) Apply topically as needed.  [provider]  metFORMIN (GLUCOPHAGE-XR) 500 MG 24 hr tablet Take 2 tablets (1,000 mg total) by mouth in the morning and at bedtime. 01/03/21   [name], Melanie Crazier, MD  methocarbamol (ROBAXIN) 500 MG tablet Take 1 tablet (500 mg total) by mouth 4 (four) times daily. Patient taking differently: Take 500 mg by mouth every 6 (six) hours as needed for muscle spasms. 08/26/20   [name] R, DO  pravastatin (PRAVACHOL) 40 MG tablet TAKE ONE TABLET BY MOUTH ONE TIME DAILY 12/26/20   Carollee Herter, Alferd Apa, DO  tamsulosin (FLOMAX) 0.4 MG CAPS capsule Take 1 capsule (0.4 mg total) by mouth daily. 03/10/20   [name], DO  tobramycin (TOBREX) 0.3 % ophthalmic ointment Place into the left eye 3 (three) times daily. 11/21/20   Thurnell Lose, MD  traMADol (ULTRAM) 50 MG tablet TAKE ONE TO TWO TABLETS BY MOUTH EVERY 8 HOURS AS NEEDED FOR PAIN 12/22/20   [name], DO    Allergies    Patient has no known allergies.  Review of Systems   Review of Systems  Constitutional: Negative for fever.  Gastrointestinal: Negative for abdominal pain.  Genitourinary:       No incontinence  Musculoskeletal: Positive for back pain.  Neurological: Positive for weakness and numbness.  All other systems  reviewed and are negative.   Physical Exam Updated Vital Signs BP 136/79   Pulse 78   Temp 98.6 F (37 C) (Oral)   Resp 18   SpO2 100%   Physical Exam Vitals and nursing note reviewed.  Constitutional:      Appearance: He is well-developed. He is obese.  HENT:     Head: Normocephalic and atraumatic.     Right Ear: External ear normal.     Left Ear: External ear normal.     Nose: Nose normal.  Eyes:     General:        Right eye: No discharge.        Left eye: No discharge.  Cardiovascular:     Rate and Rhythm: Normal rate and regular rhythm.     Heart sounds: Normal heart sounds.  Pulmonary:     Effort: Pulmonary effort is normal.     Breath sounds: Normal breath sounds.  Abdominal:     Palpations: Abdomen is soft.     Tenderness: There is no abdominal tenderness.  Musculoskeletal:     Cervical back: Neck supple.     Thoracic back: No tenderness.     Lumbar back: Tenderness (midline and paraspinal) present.  Skin:    General: Skin is warm and dry.     Comments: No obvious inguinal or femoral hernia. I do not see any swelling and patient states he feels like the swelling is improved.  Neurological:     Mental Status: He is alert.     Comments: 4/5 strength in RLE, hard to tell if true weakness, effort, or pain. 5/5 strength in LLE. Diffuse decreased sensation in bilateral feet to light touch  Psychiatric:        Mood and Affect: Mood is not anxious.     ED Results / Procedures / Treatments   Labs (all labs ordered are listed, but only abnormal results are displayed) Labs Reviewed - No data to display  EKG None  Radiology CT ABDOMEN PELVIS WO CONTRAST  Result Date: 01/04/2021 CLINICAL DATA:  Four days of right-sided groin and back pain EXAM: CT ABDOMEN AND  PELVIS WITHOUT CONTRAST TECHNIQUE: Multidetector CT imaging of the abdomen and pelvis was performed following the standard protocol without IV contrast. COMPARISON:  January 21, 2020 FINDINGS: Lower chest: No  acute abnormality. Coronary artery calcifications. Normal size heart. No significant pericardial effusion/thickening. Hepatobiliary: Tiny calcified hepatic granuloma in the right lobe of the liver. No suspicious hepatic lesions on this noncontrast examination. Gallbladder is unremarkable. No biliary ductal dilation. Pancreas: Within normal limits. Spleen: Within normal limits. Adrenals/Urinary Tract: Bilateral adrenal glands are unremarkable. No hydronephrosis. No contour deforming renal masses. Punctate nonobstructive left renal calculi measuring 2-3 mm. Urinary bladder is grossly unremarkable delete that for degree of distension. Stomach/Bowel: Tiny hiatal hernia otherwise stomach is grossly unremarkable. Normal positioning of the duodenum/ligament of [place]. No pathologic dilation of small bowel. Prominent formed stool throughout the colon. Vascular/Lymphatic: No significant vascular findings are present. Prominent left external iliac lymph node is again visualized measuring 1 cm in diameter, unchanged from prior. No pathologically enlarged abdominal or pelvic lymph nodes. Reproductive: Prostate is unremarkable. Other: No abdominopelvic ascites.  No pneumoperitoneum. Musculoskeletal: Multilevel degenerative changes spine. No acute osseous abnormality. 1.4 cm sclerotic lesion centrally in the L3 vertebral body is nonspecific but unchanged dating back to 06/11/2018 and likely benign. IMPRESSION: 1. No acute abnormality in the abdomen or pelvis visualized. 2. Prominent stool throughout the colon favors constipation. 3. Punctate nonobstructive left renal calculi. 4. Borderline prominent left external iliac node, likely reactive and not increased from prior exam. 5. Aortic Atherosclerosis (ICD10-I70.0). Electronically Signed   By: Dahlia Bailiff MD   On: 01/04/2021 00:21   DG Thoracic Spine 2 View  Result Date: 01/05/2021 CLINICAL DATA:  Upper back pain. EXAM: THORACIC SPINE 2 VIEWS COMPARISON:  None. FINDINGS:  There is no evidence of thoracic spine fracture. Alignment is normal. No other significant bone abnormalities are identified. IMPRESSION: Negative. Electronically Signed   By: Marijo Conception M.D.   On: 01/05/2021 18:25   DG Lumbar Spine Complete  Result Date: 01/05/2021 CLINICAL DATA:  Lower back pain. EXAM: LUMBAR SPINE - COMPLETE 4+ VIEW COMPARISON:  None. FINDINGS: Minimal grade 1 retrolisthesis L1-2 is noted secondary to mild degenerative disc disease at this level. Mild anterior osteophyte formation is also noted at L2-3 and L3-4. No fracture is noted. Degenerative changes are seen involving posterior facet joints of L4-5 and L5-S1. IMPRESSION: Multilevel degenerative changes. No acute abnormality seen in the lumbar spine. Electronically Signed   By: Marijo Conception M.D.   On: 01/05/2021 18:24   CT HEAD WO CONTRAST  Result Date: 01/04/2021 CLINICAL DATA:  Syncopal episode with fall and head trauma EXAM: CT HEAD WITHOUT CONTRAST CT CERVICAL SPINE WITHOUT CONTRAST TECHNIQUE: Multidetector CT imaging of the head and cervical spine was performed following the standard protocol without intravenous contrast. Multiplanar CT image reconstructions of the cervical spine were also generated. COMPARISON:  November 01, 2020 . FINDINGS: CT HEAD FINDINGS Brain: No evidence of acute large vascular territory infarction, hemorrhage, hydrocephalus, extra-axial collection or mass lesion/mass effect. Stable burden of mild chronic microvascular ischemic disease including the lacunar type infarct within the right frontal lobe periventricular white matter. Mild age related global parenchymal volume loss. Vascular: No hyperdense vessel. Atherosclerotic calcifications of the internal carotid arteries at skull base. Skull: Negative for fracture or focal lesion. Sinuses/Orbits: The visualized paranasal sinuses and mastoid air cells are predominantly clear. Orbits are grossly unremarkable. Other: None CT CERVICAL SPINE FINDINGS  Alignment: Similar straightening of the normal cervical lordosis. No evidence of traumatic  listhesis. Skull base and vertebrae: No acute fracture. No primary bone lesion or focal pathologic process. Soft tissues and spinal canal: No prevertebral fluid or swelling. No visible canal hematoma. Disc levels: Mild disc space narrowing and endplate spurring from X2-J1. Upper chest: Negative Other: None IMPRESSION: 1. No evidence of acute intracranial process. 2. No evidence of acute fracture or traumatic listhesis of the cervical spine. 3. Stable burden of mild chronic microvascular ischemic disease and age related global parenchymal volume loss. 4. Mild lower cervical spondylosis. Electronically Signed   By: Dahlia Bailiff MD   On: 01/04/2021 00:14   CT CERVICAL SPINE WO CONTRAST  Result Date: 01/04/2021 CLINICAL DATA:  Syncopal episode with fall and head trauma EXAM: CT HEAD WITHOUT CONTRAST CT CERVICAL SPINE WITHOUT CONTRAST TECHNIQUE: Multidetector CT imaging of the head and cervical spine was performed following the standard protocol without intravenous contrast. Multiplanar CT image reconstructions of the cervical spine were also generated. COMPARISON:  November 01, 2020 . FINDINGS: CT HEAD FINDINGS Brain: No evidence of acute large vascular territory infarction, hemorrhage, hydrocephalus, extra-axial collection or mass lesion/mass effect. Stable burden of mild chronic microvascular ischemic disease including the lacunar type infarct within the right frontal lobe periventricular white matter. Mild age related global parenchymal volume loss. Vascular: No hyperdense vessel. Atherosclerotic calcifications of the internal carotid arteries at skull base. Skull: Negative for fracture or focal lesion. Sinuses/Orbits: The visualized paranasal sinuses and mastoid air cells are predominantly clear. Orbits are grossly unremarkable. Other: None CT CERVICAL SPINE FINDINGS Alignment: Similar straightening of the normal cervical  lordosis. No evidence of traumatic listhesis. Skull base and vertebrae: No acute fracture. No primary bone lesion or focal pathologic process. Soft tissues and spinal canal: No prevertebral fluid or swelling. No visible canal hematoma. Disc levels: Mild disc space narrowing and endplate spurring from H4-R7. Upper chest: Negative Other: None IMPRESSION: 1. No evidence of acute intracranial process. 2. No evidence of acute fracture or traumatic listhesis of the cervical spine. 3. Stable burden of mild chronic microvascular ischemic disease and age related global parenchymal volume loss. 4. Mild lower cervical spondylosis. Electronically Signed   By: Dahlia Bailiff MD   On: 01/04/2021 00:14   DG Chest Port 1 View  Result Date: 01/04/2021 CLINICAL DATA:  Fall EXAM: PORTABLE CHEST 1 VIEW COMPARISON:  November 28, 2020 FINDINGS: The heart size and mediastinal contours are within normal limits. Both lungs are clear. The visualized skeletal structures are unremarkable. IMPRESSION: No active disease. Electronically Signed   By: Dahlia Bailiff MD   On: 01/04/2021 00:23    Procedures Procedures   Medications Ordered in ED Medications  HYDROcodone-acetaminophen (NORCO/VICODIN) 5-325 MG per tablet 2 tablet (has no administration in time range)  HYDROmorphone (DILAUDID) injection 2 mg (2 mg Intramuscular Given 01/05/21 2259)    ED Course  I have reviewed the triage vital signs and the nursing notes.  Pertinent labs & imaging results that were available during my care of the patient were reviewed by me and considered in my medical decision making (see chart for details).    MDM Rules/Calculators/A&P                          Patient presents with back pain. Longstanding back pain history. Xrays from triage are benign. Just had CT scans with no apparent spinal pathology.  Questionable right leg weakness but this seems more pain related.  Upon reevaluation of the patient in the room he  is freely moving his right  lower extremity to reposition himself.  He was able to ambulate to the bathroom without assistance.  My suspicion that he has acute spinal cord emergency is quite low and I do not think emergent MRI is needed.  Otherwise will discharge home with return precautions. Final Clinical Impression(s) / ED Diagnoses Final diagnoses:  Chronic right-sided low back pain with right-sided sciatica    Rx / DC Orders ED Discharge Orders    None       Sherwood Gambler, MD 01/05/21 2340

## 2021-01-05 NOTE — Telephone Encounter (Addendum)
Gracey call from [phone] and stated that pt, was picked up by EMS to go to [facility] he was not feeling good from 2 days ago with back pain His blood pressure was 168/98  Please advice

## 2021-01-05 NOTE — ED Triage Notes (Signed)
Pt is here for chronic generalized back pain x1 year which has been increased for 2 days, pt was seen at [facility] on 5/18 and discharged.  Pt is back due to continues pain and right groin pain.

## 2021-01-05 NOTE — ED Provider Notes (Signed)
Emergency Medicine Provider Triage Evaluation Note  Tyler Brewer , a 60 y.o. male  was evaluated in triage.  Pt complains of back pain and a mass in his groin.  Patient states that he has been having mid to low back pain which has been ongoing for weeks.  He has pain with movement, worse when he lays on that side.  Describes it as "pinching".  He was seen yesterday at [facility] for the fall.  Pain is no worse since the fall.  Denies any loss of bowel bladder control, no numbness or tingling in his lower extremities.  He also complains of a mass in his groin.  No history of inguinal hernia.  Denies any difficulty urinating or moving his bowels.  No nausea or vomiting.  Review of Systems  Positive: As above Negative: As above  Physical Exam  BP (!) 155/99 (BP Location: Left Arm)   Pulse 88   Temp 98.6 F (37 C) (Oral)   Resp (!) 22   SpO2 99%  Gen:   Awake, no distress   Resp:  Normal effort  MSK:   Midline tenderness to the T and L-spine.  5/5 strength in upper and lower extremities bilaterally.  Sensations intact. Other:  Mass in the groin difficult to assess given the patient is sitting.  No palpable hernia.  Medical Decision Making  Medically screening exam initiated at 5:34 PM.  Appropriate orders placed.  Tyler Brewer was informed that the remainder of the evaluation will be completed by another provider, this initial triage assessment does not replace that evaluation, and the importance of remaining in the ED until their evaluation is complete.     Garald Balding, PA-C 01/05/21 1751    Arnaldo Natal, MD 01/05/21 1800

## 2021-01-05 NOTE — Chronic Care Management (AMB) (Signed)
Chronic Care Management    Clinical Social Work Note  01/05/2021 Name: Tyler Brewer MRN: [id number] DOB: 27-Jan-1961  Tyler Brewer is a 60 y.o. year old male who is a primary care patient of [name], DO. The CCM team was consulted to assist the patient with chronic disease management and/or care coordination needs related to: [employer], Level of Care Concerns and Caregiver Stress in Patient with History of Falls/Fall Risk, Unsteady Gait, Impaired Mobility, Weakness, Tremor, Syncope, Balance Problem and Lower Extremity Edema.   Engaged with patient by telephone for follow up visit in response to provider referral for social work chronic care management and care coordination services.   Consent to Services:  The patient was given information about Chronic Care Management services, agreed to services, and gave verbal consent prior to initiation of services.  Please see initial visit note for detailed documentation.   Patient agreed to services and consent obtained.   Assessment: Review of patient past medical history, allergies, medications, and health status, including review of relevant consultants reports was performed today as part of a comprehensive evaluation and provision of chronic care management and care coordination services.     SDOH (Social Determinants of Health) assessments and interventions performed:    Advanced Directives Status: Not addressed in this encounter.  CCM Care Plan  No Known Allergies  Outpatient Encounter Medications as of 01/05/2021  Medication Sig  . acetaminophen (TYLENOL) 500 MG tablet Take 500 mg by mouth every 6 (six) hours as needed. States he is taking one to two tablets a day  . amLODipine (NORVASC) 10 MG tablet Take 1 tablet (10 mg total) by mouth daily.  . Chlorpheniramine Maleate (ALLERGY RELIEF PO) Take 1 tablet by mouth daily as needed (allergies).  . diclofenac Sodium (VOLTAREN) 1 % GEL Apply 2 g topically 4 (four)  times daily.  . DULoxetine (CYMBALTA) 30 MG capsule Take 1 capsule (30 mg total) by mouth daily. (Patient taking differently: Take 30 mg by mouth 3 (three) times a week.)  . furosemide (LASIX) 20 MG tablet TAKE 1 TABLET(20 MG) BY MOUTH DAILY  . gabapentin (NEURONTIN) 300 MG capsule Take 2 tablets in the morning, 1 tablet in the afternoon, and 1 tablet at bedtime. (Patient taking differently: Take 300-600 mg by mouth See admin instructions. Take 600 mg in the morning, 300 mg in the afternoon and at bedtime)  . hydrALAZINE (APRESOLINE) 50 MG tablet Take 1 tablet (50 mg total) by mouth every 8 (eight) hours.  [name] Kitchen ibuprofen (ADVIL) 200 MG tablet Take 200 mg by mouth every 6 (six) hours as needed.  . insulin aspart (NOVOLOG FLEXPEN) 100 UNIT/ML FlexPen Inject 12 Units into the skin 3 (three) times daily with meals.  . insulin degludec (TRESIBA FLEXTOUCH) 200 UNIT/ML FlexTouch Pen Inject 46 Units into the skin daily.  . Insulin Pen Needle 31G X 5 MM MISC 1 Device by Does not apply route in the morning, at noon, in the evening, and at bedtime.  . Lidocaine HCl 1 % GEL Apply 1 application topically as needed. Reports lidocaine cream 1% 2-3 times/day to lower back/knee and to hip occasionally.  [name] Kitchen losartan (COZAAR) 100 MG tablet Take 1 tablet (100 mg total) by mouth daily.  . Menthol, Topical Analgesic, (ICY HOT EX) Apply topically as needed.  . metFORMIN (GLUCOPHAGE-XR) 500 MG 24 hr tablet Take 2 tablets (1,000 mg total) by mouth in the morning and at bedtime.  . methocarbamol (ROBAXIN) 500 MG tablet Take 1 tablet (  500 mg total) by mouth 4 (four) times daily. (Patient taking differently: Take 500 mg by mouth every 6 (six) hours as needed for muscle spasms.)  . pravastatin (PRAVACHOL) 40 MG tablet TAKE ONE TABLET BY MOUTH ONE TIME DAILY  . tamsulosin (FLOMAX) 0.4 MG CAPS capsule Take 1 capsule (0.4 mg total) by mouth daily.  [name] Kitchen tobramycin (TOBREX) 0.3 % ophthalmic ointment Place into the left eye 3 (three) times  daily.  . traMADol (ULTRAM) 50 MG tablet TAKE ONE TO TWO TABLETS BY MOUTH EVERY 8 HOURS AS NEEDED FOR PAIN   No facility-administered encounter medications on file as of 01/05/2021.    Patient Active Problem List   Diagnosis Date Noted  . Diabetes mellitus ([place]) 01/03/2021  . Type 2 diabetes mellitus with hyperglycemia, with long-term current use of insulin ([place]) 01/03/2021  . Lower extremity edema 11/28/2020  . Chronic low back pain 11/28/2020  . Psoriatic arthritis ([place]) 11/28/2020  . Thoracic aortic aneurysm without rupture ([place]) 11/28/2020  . Bilateral leg weakness 11/05/2020  . Acute left-sided low back pain with right-sided sciatica 07/19/2020  . Hip pain, acute, left 07/05/2020  . Tremor 07/05/2020  . Weakness 07/05/2020  . Balance problem 03/11/2020  . Tinea cruris 03/11/2020  . Cellulitis of left groin 03/11/2020  . Acute pain of right shoulder 03/11/2020  . Interstitial lung disease ([place]) 05/02/2017  . Pansinusitis 09/26/2016  . Diabetic polyneuropathy associated with diabetes mellitus due to underlying condition ([place]) 05/08/2016  . Methamphetamine use disorder, severe, dependence ([place]) 02/11/2016  . Substance induced mood disorder ([place]) 02/11/2016  . Exertional dyspnea 11/30/2015  . ADD (attention deficit disorder) 09/29/2015  . Binge eating 09/29/2015  . Syncope 05/05/2012  . Diarrhea 05/05/2012  . Panic attacks 05/05/2012  . OSA (obstructive sleep apnea) 05/05/2012  . HTN (hypertension) 05/05/2012  . Dyslipidemia 05/05/2012  . CAD (coronary artery disease)  cardiac cath with moderate disease in a septal branch of the ramus intermedius 04/01/2012  . Chest pain 04/01/2012  . Drug abuse and dependence ([place]) 04/01/2012  . Family history of coronary artery disease 03/31/2012  . Sleep apnea, on C-pap 03/31/2012  . Hyperlipemia 03/30/2012  . HTN (hypertension), benign 03/30/2012  . Morbid obesity ([place]) 03/30/2012  . DM type 2, uncontrolled, with neuropathy ([place])  03/30/2012  . Depression with suicidal ideation 03/30/2012    Conditions to be addressed/monitored: [employer], Level of Care Concerns and Caregiver Stress in Patient with History of Falls/Fall Risk, Unsteady Gait, Impaired Mobility, Weakness, Tremor, Syncope, Balance Problem and Lower Extremity Edema.  Limited Social Support, Housing Barriers, Level of Care Concerns, ADL/IADL Limitations, Social Isolation, Limited Access to Caregiver and Memory Deficits.  Care Plan : LCSW Plan of Care  Updates made by Francis Gaines, LCSW since 01/05/2021 12:00 AM    Problem: Receive Short-Term Rehabilitative Services in a [place].   Priority: High    Goal: Receive Short-Term Rehabilitative Services in a [place].   Start Date: 01/04/2021  Expected End Date: 01/17/2021  This Visit's Progress: On track  Recent Progress: On track  Priority: High  Note:   Current Barriers:   . Patient with History of Falls/Fall Risk, Unsteady Gait, Impaired Mobility, Weakness, Tremor, Syncope, Balance Problem and Lower Extremity Edema needs assistance with initiating short-term rehabilitative services in a skilled nursing facility. . Patient's health has declined and patient is no longer able to complete activities of daily living independently. . Patient acknowledges deficits with education and support in order to meet  this need. . Patient does not attend all scheduled provider appointments. . Patient does not adhere to prescribed medication regimen. . Patient lacks social connections. . Patient is experiencing financial constraints related to inability to pay for rent/housing. . Patient has limited social support. . Patient has level of care concerns. . Patient has mental health concerns. . Patient is experiencing family and relationship dysfunction. . Patient has limited access to caregiver. . Patient is experiencing memory deficits. Clinical Goal(s):  [name] Kitchen Over the next 30  to 45 days, patient will be placed in a skilled nursing facility to receive short-term rehabilitative services. . Patient will work with LCSW, as well as Home Health Social Worker, Insurance claims handler with [facility], to coordinate needs for short-term skilled nursing placement for rehabilitative services. Interventions: . Collaboration with Primary Care Physician, Dr. Seabron Spates regarding development and update of comprehensive plan of care as evidenced by provider attestation and co-signature. Rene Paci care team collaboration (see longitudinal plan of care). . Assessed needs and provided education on level of care and facility placement process. . Completed FL-2 Form and faxed to Primary Care Physician, Dr. Seabron Spates for review and signature. . Obtained patient's verbal consent to fax FL-2 Form to facilities of interest. . Obtained patient's verbal consent to initiate a PASSAR number. . Reviewed and initiated process to obtain PASSAR number. [name] Kitchen FL-2 Form faxed to [facility] and [facility], per patient's request. . Collaboration with identified facilities of interest [facility]on and [facility]) to confirm receipt of completed and signed FL-2 Form. . Collaboration with Otila Kluver, Home Health Physical Therapist with [facility] - # [phone], to request home health physical therapy notes and recommendations. . Collaboration with Ron Parker, Home Health Occupational Therapist with [facility] - # [phone], to request home health occupational therapy notes and recommendations. [name] Kitchen Collaboration with Misty Stanley, [facility] Social Worker with [facility] - # [phone], to request assistance with skilled nursing placement for patient. . Collaboration with Dr. Marianna Fuss, Attending Physician at [facility]) regarding request for rapid COVID-19  Screening. . Collaboration with representative from [employer] to try and obtain approval/prior authorization for short-term skilled nursing placement. . Faxed home health physical and occupational therapy notes and recommendations, along with completed and signed FL-2 Form to [facility] to try and obtain approval/prior authorization for short-term skilled nursing placement. . Patient interviewed and appropriate assessments performed. . Discussed plans with patient for ongoing care management follow up and provided patient with direct contact information for care management team. . Advised patient to answer all calls from LCSW, confirming contact information for LCSW. Patient Goals/Self-Care Activities: . Continue to work with home health nursing, physical therapist, occupational therapist and social worker with [facility]. . Answer all calls from LCSW [phone]). [name] Kitchen FL-2 Form, Home Health Physical/Occupational Therapy Notes and Recommendations, History & Physical, Demographics Sheet, Medication List, Chest X-Ray Results and [facility] Insurance have all been faxed to [facility] Admissions Department for review. [name] Kitchen LCSW contacted the Admissions Department at [facility] to confirm receipt of all information faxed to them by LCSW. [name] Kitchen FL-2 Form, Home Health Physical/Occupational Therapy Notes and Recommendations, History & Physical, Demographics Sheet, Medication List and Chest X-Ray Results have all been faxed to [facility] Claims Department for authorization and prior approval of short-term skilled nursing placement for rehabilitative services. [name] Kitchen  LCSW contacted [facility] Claims Department to confirm receipt of all information faxed to them by LCSW. [name] Kitchen Awaiting authorization and prior approval from [facility]. [name] Kitchen Awaiting bed offer from [facility] and  [place]. Follow Up Plan: Telephone follow up appointment with care management team member scheduled for: 01/06/2021 at 3:00pm.      Follow Up Plan: 01/06/2021 at 3:00pm.      Nat Christen Sisters Licensed Clinical Social Worker [place] [phone]

## 2021-01-06 ENCOUNTER — Ambulatory Visit: Payer: Medicare HMO | Admitting: Pharmacist

## 2021-01-06 ENCOUNTER — Encounter: Payer: Self-pay | Admitting: Family Medicine

## 2021-01-06 ENCOUNTER — Encounter: Payer: Self-pay | Admitting: Internal Medicine

## 2021-01-06 ENCOUNTER — Ambulatory Visit: Payer: Medicare HMO | Admitting: *Deleted

## 2021-01-06 ENCOUNTER — Telehealth: Payer: Self-pay | Admitting: Pharmacist

## 2021-01-06 DIAGNOSIS — G8929 Other chronic pain: Secondary | ICD-10-CM

## 2021-01-06 DIAGNOSIS — E1165 Type 2 diabetes mellitus with hyperglycemia: Secondary | ICD-10-CM | POA: Diagnosis not present

## 2021-01-06 DIAGNOSIS — E1140 Type 2 diabetes mellitus with diabetic neuropathy, unspecified: Secondary | ICD-10-CM | POA: Diagnosis not present

## 2021-01-06 DIAGNOSIS — R29898 Other symptoms and signs involving the musculoskeletal system: Secondary | ICD-10-CM

## 2021-01-06 DIAGNOSIS — Z794 Long term (current) use of insulin: Secondary | ICD-10-CM

## 2021-01-06 DIAGNOSIS — R600 Localized edema: Secondary | ICD-10-CM

## 2021-01-06 DIAGNOSIS — I10 Essential (primary) hypertension: Secondary | ICD-10-CM | POA: Diagnosis not present

## 2021-01-06 DIAGNOSIS — E0842 Diabetes mellitus due to underlying condition with diabetic polyneuropathy: Secondary | ICD-10-CM | POA: Diagnosis not present

## 2021-01-06 DIAGNOSIS — R251 Tremor, unspecified: Secondary | ICD-10-CM

## 2021-01-06 DIAGNOSIS — M545 Low back pain, unspecified: Secondary | ICD-10-CM

## 2021-01-06 DIAGNOSIS — E785 Hyperlipidemia, unspecified: Secondary | ICD-10-CM

## 2021-01-06 DIAGNOSIS — M25552 Pain in left hip: Secondary | ICD-10-CM

## 2021-01-06 DIAGNOSIS — R2689 Other abnormalities of gait and mobility: Secondary | ICD-10-CM

## 2021-01-06 DIAGNOSIS — R531 Weakness: Secondary | ICD-10-CM

## 2021-01-06 DIAGNOSIS — L03314 Cellulitis of groin: Secondary | ICD-10-CM

## 2021-01-06 NOTE — Chronic Care Management (AMB) (Signed)
Chronic Care Management    Clinical Social Work Note  01/06/2021 Name: Xiomar Crompton MRN: [id number] DOB: [birth year]  Keric Zehren is a 60 y.o. year old male who is a primary care patient of [name], DO. The CCM team was consulted to assist the patient with chronic disease management and/or care coordination needs related to: [employer], Level of Care Concerns, and Caregiver Stress in Patient with History of Falls/Fall Risk, Unsteady Gait, Impaired Mobility, Weakness, Tremor, Syncope, Balance Problem and Lower Extremity Edema.  Engaged with patient by telephone for follow up visit in response to provider referral for social work chronic care management and care coordination services.   Consent to Services:  The patient was given information about Chronic Care Management services, agreed to services, and gave verbal consent prior to initiation of services.  Please see initial visit note for detailed documentation.   Patient agreed to services and consent obtained.   Assessment: Review of patient past medical history, allergies, medications, and health status, including review of relevant consultants reports was performed today as part of a comprehensive evaluation and provision of chronic care management and care coordination services.     SDOH (Social Determinants of Health) assessments and interventions performed:    Advanced Directives Status: Not ready or willing to discuss.  CCM Care Plan  No Known Allergies  Outpatient Encounter Medications as of 01/06/2021  Medication Sig Note  . acetaminophen (TYLENOL) 500 MG tablet Take 500 mg by mouth every 6 (six) hours as needed. States he is taking one to two tablets a day   . amLODipine (NORVASC) 10 MG tablet Take 1 tablet (10 mg total) by mouth daily.   . Chlorpheniramine Maleate (ALLERGY RELIEF PO) Take 1 tablet by mouth daily as needed (allergies). (Patient not taking: Reported on 01/06/2021)   . diclofenac  Sodium (VOLTAREN) 1 % GEL Apply 2 g topically 4 (four) times daily.   . DULoxetine (CYMBALTA) 30 MG capsule Take 1 capsule (30 mg total) by mouth daily. (Patient taking differently: Take 30 mg by mouth 3 (three) times a week.)   . furosemide (LASIX) 20 MG tablet TAKE 1 TABLET(20 MG) BY MOUTH DAILY   . gabapentin (NEURONTIN) 300 MG capsule Take 2 tablets in the morning, 1 tablet in the afternoon, and 1 tablet at bedtime. (Patient taking differently: Take 300-600 mg by mouth See admin instructions. Take 600 mg in the morning, 300 mg in the afternoon and at bedtime)   . hydrALAZINE (APRESOLINE) 50 MG tablet Take 1 tablet (50 mg total) by mouth every 8 (eight) hours.   [name] Kitchen ibuprofen (ADVIL) 200 MG tablet Take 200 mg by mouth every 6 (six) hours as needed.   . insulin aspart (NOVOLOG FLEXPEN) 100 UNIT/ML FlexPen Inject 12 Units into the skin 3 (three) times daily with meals.   . insulin degludec (TRESIBA FLEXTOUCH) 200 UNIT/ML FlexTouch Pen Inject 46 Units into the skin daily.   . Insulin Pen Needle 31G X 5 MM MISC 1 Device by Does not apply route in the morning, at noon, in the evening, and at bedtime.   . Lidocaine HCl 1 % GEL Apply 1 application topically as needed. Reports lidocaine cream 1% 2-3 times/day to lower back/knee and to hip occasionally. (Patient not taking: Reported on 01/06/2021) 01/06/2021: Due to cost; OTC and not covered by insurance plan  . losartan (COZAAR) 100 MG tablet Take 1 tablet (100 mg total) by mouth daily.   . Menthol, Topical Analgesic, (ICY HOT EX)  Apply topically as needed.   . metFORMIN (GLUCOPHAGE-XR) 500 MG 24 hr tablet Take 2 tablets (1,000 mg total) by mouth in the morning and at bedtime. (Patient not taking: Reported on 01/06/2021) 01/06/2021: Hasn't picked up from pharmacy yet  . methocarbamol (ROBAXIN) 500 MG tablet Take 1 tablet (500 mg total) by mouth 4 (four) times daily. (Patient taking differently: Take 500 mg by mouth every 6 (six) hours as needed for muscle spasms.)    . pravastatin (PRAVACHOL) 40 MG tablet TAKE ONE TABLET BY MOUTH ONE TIME DAILY   . tamsulosin (FLOMAX) 0.4 MG CAPS capsule Take 1 capsule (0.4 mg total) by mouth daily.   [name] Kitchen tobramycin (TOBREX) 0.3 % ophthalmic ointment Place into the left eye 3 (three) times daily. (Patient not taking: Reported on 01/06/2021)   . traMADol (ULTRAM) 50 MG tablet TAKE ONE TO TWO TABLETS BY MOUTH EVERY 8 HOURS AS NEEDED FOR PAIN    No facility-administered encounter medications on file as of 01/06/2021.    Patient Active Problem List   Diagnosis Date Noted  . Diabetes mellitus ([place]) 01/03/2021  . Type 2 diabetes mellitus with hyperglycemia, with long-term current use of insulin ([place]) 01/03/2021  . Lower extremity edema 11/28/2020  . Chronic low back pain 11/28/2020  . Psoriatic arthritis ([place]) 11/28/2020  . Thoracic aortic aneurysm without rupture ([place]) 11/28/2020  . Bilateral leg weakness 11/05/2020  . Acute left-sided low back pain with right-sided sciatica 07/19/2020  . Hip pain, acute, left 07/05/2020  . Tremor 07/05/2020  . Weakness 07/05/2020  . Balance problem 03/11/2020  . Tinea cruris 03/11/2020  . Cellulitis of left groin 03/11/2020  . Acute pain of right shoulder 03/11/2020  . Interstitial lung disease ([place]) 05/02/2017  . Pansinusitis 09/26/2016  . Diabetic polyneuropathy associated with diabetes mellitus due to underlying condition ([place]) 05/08/2016  . Methamphetamine use disorder, severe, dependence ([place]) 02/11/2016  . Substance induced mood disorder ([place]) 02/11/2016  . Exertional dyspnea 11/30/2015  . ADD (attention deficit disorder) 09/29/2015  . Binge eating 09/29/2015  . Syncope 05/05/2012  . Diarrhea 05/05/2012  . Panic attacks 05/05/2012  . OSA (obstructive sleep apnea) 05/05/2012  . HTN (hypertension) 05/05/2012  . Dyslipidemia 05/05/2012  . CAD (coronary artery disease)  cardiac cath with moderate disease in a septal branch of the ramus intermedius 04/01/2012  . Chest pain  04/01/2012  . Drug abuse and dependence ([place]) 04/01/2012  . Family history of coronary artery disease 03/31/2012  . Sleep apnea, on C-pap 03/31/2012  . Hyperlipemia 03/30/2012  . HTN (hypertension), benign 03/30/2012  . Morbid obesity ([place]) 03/30/2012  . DM type 2, uncontrolled, with neuropathy ([place]) 03/30/2012  . Depression with suicidal ideation 03/30/2012    Conditions to be addressed/monitored:  [employer], Level of Care Concerns, and Caregiver Stress in Patient with History of Falls/Fall Risk, Unsteady Gait, Impaired Mobility, Weakness, Tremor, Syncope, Balance Problem and Lower Extremity Edema.  Limited Social Support, Housing Barriers, Level of Care Concerns, ADL/IADL Limitations, Family and Relationship Dysfunction, Social Isolation, Limited Access to Caregiver and Memory Deficits.  Care Plan : LCSW Plan of Care  Updates made by Francis Gaines, LCSW since 01/06/2021 12:00 AM    Problem: Receive Short-Term Rehabilitative Services in a [place].   Priority: High    Goal: Receive Short-Term Rehabilitative Services in a [place].   Start Date: 01/04/2021  Expected End Date: 01/17/2021  This Visit's Progress: On track  Recent Progress: On track  Priority: High  Note:  Current Barriers:   . Patient with History of Falls/Fall Risk, Unsteady Gait, Impaired Mobility, Weakness, Tremor, Syncope, Balance Problem and Lower Extremity Edema needs assistance with initiating short-term rehabilitative services in a skilled nursing facility. . Patient's health has declined and patient is no longer able to complete activities of daily living independently. . Patient acknowledges deficits with education and support in order to meet this need. . Patient does not attend all scheduled provider appointments. . Patient does not adhere to prescribed medication regimen. . Patient lacks social connections. . Patient is experiencing financial constraints related  to inability to pay for rent/housing. . Patient has limited social support. . Patient has level of care concerns. . Patient has mental health concerns. . Patient is experiencing family and relationship dysfunction. . Patient has limited access to caregiver. . Patient is experiencing memory deficits. Clinical Goal(s):  [name] Kitchen Over the next 30 to 45 days, patient will be placed in a skilled nursing facility to receive short-term rehabilitative services. . Patient will work with LCSW, as well as Home Health Social Worker, Chiropodist with [facility], to coordinate needs for short-term skilled nursing placement for rehabilitative services. Interventions: . Collaboration with Primary Care Physician, Dr. [name] regarding development and update of comprehensive plan of care as evidenced by provider attestation and co-signature. Bertram Savin care team collaboration (see longitudinal plan of care). . Assessed needs and provided education on level of care and facility placement process. . Completed FL-2 Form and faxed to Primary Care Physician, Dr. [name] for review and signature. . Obtained patient's verbal consent to fax FL-2 Form to facilities of interest. . Obtained patient's verbal consent to initiate a PASSAR number. . Reviewed and initiated process to obtain PASSAR number. [name] Kitchen FL-2 Form faxed to [facility] and [place], per patient's request. . Collaboration with identified facilities of interest [facility]) to confirm receipt of completed and signed FL-2 Form. . Collaboration with Mora Appl, Home Health Physical Therapist with [facility] - # [phone], to request home health physical therapy notes and recommendations. . Collaboration with Costella Hatcher, [place] Occupational Therapist with [facility] - # [phone], to request home health occupational therapy notes and  recommendations. [name] Kitchen Collaboration with Lattie Haw, [facility] Social Worker with [facility] - # [phone], to request assistance with skilled nursing placement for patient. . Collaboration with Dr. Madalyn Rob, Attending Physician at [facility]) regarding request for rapid COVID-19 Screening. . Collaboration with representative from [employer] to try and obtain approval/prior authorization for short-term skilled nursing placement. . Faxed home health physical and occupational therapy notes and recommendations, along with completed and signed FL-2 Form to [facility] to try and obtain approval/prior authorization for short-term skilled nursing placement. . Patient interviewed and appropriate assessments performed. . Discussed plans with patient for ongoing care management follow up and provided patient with direct contact information for care management team. . Advised patient to answer all calls from LCSW, confirming contact information for LCSW. Patient Goals/Self-Care Activities: . Continue to work with home health nursing, physical therapist, occupational therapist and social worker with [facility]. . Answer all calls from LCSW [phone]). [name] Kitchen FL-2 Form, Home Health Physical/Occupational Therapy Notes and Recommendations, History & Physical, Demographics Sheet, Medication List, Chest X-Ray Results and [facility] Insurance have all been faxed to [facility] and [facility] Admissions Department for review.  [facility] and [facility] have denied admissions due to recent drug use. [name] Kitchen FL-2 Form, Home Health Physical/Occupational Therapy Notes and Recommendations, History & Physical, Demographics Sheet, Medication List, Chest X-Ray Results and [facility] Insurance have all been faxed to [facility] Admissions Department for review.  [name] Kitchen LCSW  contacted the Admissions Department at [facility] to confirm receipt of all information faxed to them by LCSW. [name] Kitchen FL-2 Form, Home Health Physical/Occupational Therapy Notes and Recommendations, History & Physical, Demographics Sheet, Medication List and Chest X-Ray Results have all been faxed to [place] Department for authorization and prior approval of short-term skilled nursing placement for rehabilitative services. [name] Kitchen LCSW contacted [facility] Claims Department to confirm receipt of all information faxed to them by LCSW.  Approval/Denial is still pending. . Awaiting bed offer from [place]. Follow Up Plan: Telephone follow up appointment with care management team member scheduled for: 01/13/2021 at 11:00am.      Follow Up Plan:  01/13/2021 at 11:00am.      Nat Christen LCSW Licensed Clinical Social Worker [place] [phone]

## 2021-01-06 NOTE — Telephone Encounter (Signed)
Patient requesting refill for 90 DS for amlodipine 10mg  daily (last filled #30 11/21/20 - Dr Candiss Norse written @ hospital discharge)

## 2021-01-06 NOTE — Telephone Encounter (Signed)
FYI

## 2021-01-06 NOTE — Patient Instructions (Signed)
Visit Information  PATIENT GOALS: Goals Addressed              This Visit's Progress   .  Receive Short-Term Rehabilitative Services in a [place]. (pt-stated)   On track     Timeframe:  Short-Range Goal Priority:  High Start Date:  01/04/2021                          Expected End Date: 01/17/2021  Follow-Up Plan:  01/13/2021 at 11:00am.  Patient Goals/Self-Care Activities: . Continue to work with home health nursing, physical therapist, occupational therapist and social worker with [facility]. . Answer all calls from LCSW [phone]). [name] Kitchen FL-2 Form, Home Health Physical/Occupational Therapy Notes and Recommendations, History & Physical, Demographics Sheet, Medication List, Chest X-Ray Results and [facility] Insurance have all been faxed to [facility] and [facility] Admissions Department for review.  [facility] have denied admissions due to recent drug use. [name] Kitchen FL-2 Form, Home Health Physical/Occupational Therapy Notes and Recommendations, History & Physical, Demographics Sheet, Medication List, Chest X-Ray Results and [facility] Insurance have all been faxed to [facility] Admissions Department for review.  [name] Kitchen LCSW contacted the Admissions Department at [facility] to confirm receipt of all information faxed to them by LCSW. [name] Kitchen FL-2 Form, Home Health Physical/Occupational Therapy Notes and Recommendations, History & Physical, Demographics Sheet, Medication List and Chest X-Ray Results have all been faxed to [place] Department for authorization and prior approval of short-term skilled nursing placement for rehabilitative services. [name] Kitchen LCSW contacted [facility] Claims Department to confirm receipt of all information faxed to them by LCSW.  Approval/Denial is still pending. . Awaiting bed offer from [facility].        Patient verbalizes understanding of instructions provided today and agrees to view in [place].   Telephone follow up appointment with care management team member scheduled for:  01/13/2021 at 11:00am.  Nat Christen [place] Licensed Clinical Social Worker [place] [phone]

## 2021-01-06 NOTE — Telephone Encounter (Signed)
He can try 2 tramadol but I am unable to give him anything stronger  Pain referral was placed ---  we are waiting on app Did he miss the neuro appointment?

## 2021-01-06 NOTE — Telephone Encounter (Signed)
He has to see pain management ----- you let him know we know he's been refusing to schedule and then give him the number to call them but I will only do tramadol temporarily

## 2021-01-06 NOTE — Telephone Encounter (Signed)
Thank you :)

## 2021-01-08 ENCOUNTER — Other Ambulatory Visit: Payer: Self-pay

## 2021-01-08 ENCOUNTER — Emergency Department (HOSPITAL_BASED_OUTPATIENT_CLINIC_OR_DEPARTMENT_OTHER)
Admission: EM | Admit: 2021-01-08 | Discharge: 2021-01-09 | Disposition: A | Discharge status: Home / Self Care | Payer: Medicare HMO | Attending: Emergency Medicine | Admitting: Emergency Medicine

## 2021-01-08 ENCOUNTER — Encounter (HOSPITAL_BASED_OUTPATIENT_CLINIC_OR_DEPARTMENT_OTHER): Payer: Self-pay | Admitting: Emergency Medicine

## 2021-01-08 DIAGNOSIS — E1165 Type 2 diabetes mellitus with hyperglycemia: Secondary | ICD-10-CM | POA: Diagnosis not present

## 2021-01-08 DIAGNOSIS — Z79899 Other long term (current) drug therapy: Secondary | ICD-10-CM | POA: Insufficient documentation

## 2021-01-08 DIAGNOSIS — Z794 Long term (current) use of insulin: Secondary | ICD-10-CM | POA: Diagnosis not present

## 2021-01-08 DIAGNOSIS — E1140 Type 2 diabetes mellitus with diabetic neuropathy, unspecified: Secondary | ICD-10-CM | POA: Diagnosis not present

## 2021-01-08 DIAGNOSIS — F17210 Nicotine dependence, cigarettes, uncomplicated: Secondary | ICD-10-CM | POA: Diagnosis not present

## 2021-01-08 DIAGNOSIS — R0689 Other abnormalities of breathing: Secondary | ICD-10-CM | POA: Diagnosis not present

## 2021-01-08 DIAGNOSIS — I10 Essential (primary) hypertension: Secondary | ICD-10-CM | POA: Diagnosis not present

## 2021-01-08 DIAGNOSIS — R69 Illness, unspecified: Secondary | ICD-10-CM | POA: Diagnosis not present

## 2021-01-08 DIAGNOSIS — R55 Syncope and collapse: Secondary | ICD-10-CM | POA: Diagnosis not present

## 2021-01-08 DIAGNOSIS — R202 Paresthesia of skin: Secondary | ICD-10-CM | POA: Diagnosis not present

## 2021-01-08 DIAGNOSIS — I2510 Atherosclerotic heart disease of native coronary artery without angina pectoris: Secondary | ICD-10-CM | POA: Diagnosis not present

## 2021-01-08 DIAGNOSIS — Z743 Need for continuous supervision: Secondary | ICD-10-CM | POA: Diagnosis not present

## 2021-01-08 MED ORDER — SODIUM CHLORIDE 0.9 % IV BOLUS
1000.0000 mL | Freq: Once | INTRAVENOUS | Status: AC
  Administered 2021-01-09: 1000 mL via INTRAVENOUS

## 2021-01-08 MED ORDER — MORPHINE SULFATE (PF) 4 MG/ML IV SOLN
4.0000 mg | Freq: Once | INTRAVENOUS | Status: AC
  Administered 2021-01-09: 4 mg via INTRAVENOUS
  Filled 2021-01-08: qty 1

## 2021-01-08 NOTE — ED Provider Notes (Signed)
[facility]ERGENCY DEPARTMENT Provider Note   CSN: [id number] Arrival date & time: 01/08/21  2244     History Chief Complaint  Patient presents with  . Loss of Consciousness    Tyler Brewer is a 60 y.o. male.  Patient is a 60 year old male with past medical history of coronary artery disease, diabetes, interstitial lung disease, hypertension, obesity, and past history of substance abuse.  Patient with progressive weakness with recent admission for frequent falls.  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  He presents tonight with complaints of syncope versus seizure.  He got out of the shower this evening, then was walking towards his bed when he had some sort of unresponsive episode.  He states he began to feel dizzy, then felt what he describes as "electrical shocks" all throughout his body.  He then lost consciousness and fell onto the bed.  He woke up what he believes to be several minutes later.  This episode was unwitnessed.  Patient denies any loss of bowel or bladder continence.  He denies having experienced any injury.  He does describe pain in his right leg and back that seems to be chronic in nature.  He denies any chest pain or difficulty breathing.  He denies any fevers or chills.  The history is provided by the patient.  Loss of Consciousness Episode history:  Single Most recent episode:  Today Timing:  Constant Progression:  Resolved Chronicity:  New Relieved by:  Nothing Worsened by:  Nothing Ineffective treatments:  None tried      Past Medical History:  Diagnosis Date  . Arthritis   . CAD (coronary artery disease)  cardiac cath with moderate disease in a septal branch of the ramus intermedius 04/01/2012  . Depression   . Diabetes mellitus    poorly controlled by his report  . History of narcotic addiction ([place])    past history of back  pain  . Hypercholesteremia   . Hypertension   . IBS (irritable bowel syndrome)   . 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[place])   . Neuropathy   . Obesity    Max weight was 390  . OSA on CPAP   . Panic attacks   . Testosterone deficiency   . Vertigo     Patient Active Problem List   Diagnosis Date Noted  . Diabetes mellitus ([place]) 01/03/2021  . Type 2 diabetes mellitus with hyperglycemia, with long-term current use of insulin ([place]) 01/03/2021  . Lower extremity edema 11/28/2020  . Chronic low back pain 11/28/2020  . Psoriatic arthritis ([place]) 11/28/2020  . Thoracic aortic aneurysm without rupture ([place]) 11/28/2020  . Bilateral leg weakness 11/05/2020  . Acute left-sided low back pain with right-sided sciatica 07/19/2020  . Hip pain, acute, left 07/05/2020  . Tremor 07/05/2020  . Weakness 07/05/2020  . Balance problem 03/11/2020  . Tinea cruris 03/11/2020  . Cellulitis of left groin 03/11/2020  . Acute pain of right shoulder 03/11/2020  . Interstitial lung disease ([place]) 05/02/2017  . Pansinusitis 09/26/2016  . Diabetic polyneuropathy associated with diabetes mellitus due to underlying condition ([place]) 05/08/2016  . Methamphetamine use disorder, severe, dependence ([place]) 02/11/2016  . Substance induced mood disorder ([place]) 02/11/2016  . Exertional dyspnea 11/30/2015  . ADD (attention deficit disorder) 09/29/2015  . Binge eating 09/29/2015  . Syncope 05/05/2012  . Diarrhea 05/05/2012  . Panic attacks 05/05/2012  . OSA (obstructive sleep apnea) 05/05/2012  .  HTN (hypertension) 05/05/2012  . Dyslipidemia 05/05/2012  . CAD (coronary artery disease)  cardiac cath with moderate disease in a septal branch of the ramus intermedius 04/01/2012  . Chest pain 04/01/2012  . Drug abuse and dependence ([place]) 04/01/2012  . Family history of coronary artery disease 03/31/2012  . Sleep apnea, on C-pap 03/31/2012  . Hyperlipemia 03/30/2012  . HTN (hypertension), benign 03/30/2012  . Morbid obesity ([place])  03/30/2012  . DM type 2, uncontrolled, with neuropathy ([place]) 03/30/2012  . Depression with suicidal ideation 03/30/2012    Past Surgical History:  Procedure Laterality Date  . CARDIAC CATHETERIZATION    . IR FLUORO GUIDE CV LINE RIGHT  11/10/2020  . IR US GUIDE VASC ACCESS RIGHT  11/10/2020  . LEFT HEART CATHETERIZATION WITH CORONARY ANGIOGRAM N/A 03/31/2012   Procedure: LEFT HEART CATHETERIZATION WITH CORONARY ANGIOGRAM;  Surgeon: Leonie Man, MD;  Location: [facility] CATH LAB;  Service: Cardiovascular;  Laterality: N/A;       Family History  Problem Relation Age of Onset  . Diabetes type II Father   . Hypertension Father   . Pancreatic disease Father 50       Deceased  . Healthy Mother   . Healthy Sister   . Healthy Son   . Healthy Daughter   . Parkinson's disease Maternal Grandmother     Social History   Tobacco Use  . Smoking status: Current Every Day Smoker    Packs/day: 0.50    Types: Cigarettes    Last attempt to quit: 09/29/2020    Years since quitting: 0.2  . Smokeless tobacco: Never Used  Vaping Use  . Vaping Use: Never used  Substance Use Topics  . Alcohol use: No    Alcohol/week: 0.0 standard drinks  . Drug use: Not Currently    Types: Methamphetamines    Home Medications Prior to Admission medications   Medication Sig Start Date End Date Taking? Authorizing Provider  acetaminophen (TYLENOL) 500 MG tablet Take 500 mg by mouth every 6 (six) hours as needed. States he is taking one to two tablets a day    [provider]  amLODipine (NORVASC) 10 MG tablet Take 1 tablet (10 mg total) by mouth daily. 11/21/20   Thurnell Lose, MD  Chlorpheniramine Maleate (ALLERGY RELIEF PO) Take 1 tablet by mouth daily as needed (allergies). Patient not taking: Reported on 01/06/2021    [provider]  diclofenac Sodium (VOLTAREN) 1 % GEL Apply 2 g topically 4 (four) times daily. 08/27/20   Corena Herter, PA-C  DULoxetine (CYMBALTA) 30 MG capsule Take 1  capsule (30 mg total) by mouth daily. Patient taking differently: Take 30 mg by mouth 3 (three) times a week. 07/19/20   [name] R, DO  furosemide (LASIX) 20 MG tablet TAKE 1 TABLET(20 MG) BY MOUTH DAILY 12/21/20   Carollee Herter, Alferd Apa, DO  gabapentin (NEURONTIN) 300 MG capsule Take 2 tablets in the morning, 1 tablet in the afternoon, and 1 tablet at bedtime. Patient taking differently: Take 300-600 mg by mouth See admin instructions. Take 600 mg in the morning, 300 mg in the afternoon and at bedtime 10/27/20   Narda Amber K, DO  hydrALAZINE (APRESOLINE) 50 MG tablet Take 1 tablet (50 mg total) by mouth every 8 (eight) hours. 11/21/20   Thurnell Lose, MD  ibuprofen (ADVIL) 200 MG tablet Take 200 mg by mouth every 6 (six) hours as needed.    [provider]  insulin aspart (  NOVOLOG FLEXPEN) 100 UNIT/ML FlexPen Inject 12 Units into the skin 3 (three) times daily with meals. 01/03/21   [name], Melanie Crazier, MD  insulin degludec (TRESIBA FLEXTOUCH) 200 UNIT/ML FlexTouch Pen Inject 46 Units into the skin daily. 01/03/21   [name], Melanie Crazier, MD  Insulin Pen Needle 31G X 5 MM MISC 1 Device by Does not apply route in the morning, at noon, in the evening, and at bedtime. 01/03/21   [name], Melanie Crazier, MD  Lidocaine HCl 1 % GEL Apply 1 application topically as needed. Reports lidocaine cream 1% 2-3 times/day to lower back/knee and to hip occasionally. Patient not taking: Reported on 01/06/2021    [provider]  losartan (COZAAR) 100 MG tablet Take 1 tablet (100 mg total) by mouth daily. 07/05/20   [name], DO  Menthol, Topical Analgesic, (ICY HOT EX) Apply topically as needed.    [provider]  metFORMIN (GLUCOPHAGE-XR) 500 MG 24 hr tablet Take 2 tablets (1,000 mg total) by mouth in the morning and at bedtime. Patient not taking: Reported on 01/06/2021 01/03/21   [name], Melanie Crazier, MD  methocarbamol (ROBAXIN) 500 MG tablet  Take 1 tablet (500 mg total) by mouth 4 (four) times daily. Patient taking differently: Take 500 mg by mouth every 6 (six) hours as needed for muscle spasms. 08/26/20   [name], DO  nitrofurantoin, macrocrystal-monohydrate, (MACROBID) 100 MG capsule TAKE 1 CAPSULE BY MOUTH IN THE MORNING AND 1 CAPSULE BY MOUTH IN THE EVENING FOR 5 DAYS 10/21/20   [provider]  pravastatin (PRAVACHOL) 40 MG tablet TAKE ONE TABLET BY MOUTH ONE TIME DAILY 12/26/20   Carollee Herter, Alferd Apa, DO  tamsulosin (FLOMAX) 0.4 MG CAPS capsule Take 1 capsule (0.4 mg total) by mouth daily. 03/10/20   [name], DO  tobramycin (TOBREX) 0.3 % ophthalmic ointment Place into the left eye 3 (three) times daily. Patient not taking: Reported on 01/06/2021 11/21/20   Thurnell Lose, MD  traMADol (ULTRAM) 50 MG tablet TAKE ONE TO TWO TABLETS BY MOUTH EVERY 8 HOURS AS NEEDED FOR PAIN 12/22/20   [name], DO    Allergies    Patient has no known allergies.  Review of Systems   Review of Systems  Cardiovascular: Positive for syncope.  All other systems reviewed and are negative.   Physical Exam Updated Vital Signs BP 130/81 (BP Location: Left Arm)   Pulse 91   Temp 98.1 F (36.7 C) (Oral)   Resp 18   Ht 5\' 11"  (1.803 m)   Wt 126.1 kg   SpO2 98%   BMI 38.77 kg/m   Physical Exam Vitals and nursing note reviewed.  Constitutional:      General: He is not in acute distress.    Appearance: He is well-developed. He is not diaphoretic.  HENT:     Head: Normocephalic and atraumatic.  Cardiovascular:     Rate and Rhythm: Normal rate and regular rhythm.     Heart sounds: No murmur heard. No friction rub.  Pulmonary:     Effort: Pulmonary effort is normal. No respiratory distress.     Breath sounds: Normal breath sounds. No wheezing or rales.  Abdominal:     General: Bowel sounds are normal. There is no distension.     Palpations: Abdomen is soft.     Tenderness: There is no  abdominal tenderness.  Musculoskeletal:        General: Normal range of  motion.     Cervical back: Normal range of motion and neck supple.  Skin:    General: Skin is warm and dry.  Neurological:     General: No focal deficit present.     Mental Status: He is alert and oriented to person, place, and time.     Cranial Nerves: No cranial nerve deficit.     Coordination: Coordination normal.     Comments: Patient with generalized, but no focal weakness.  Sensation is intact throughout all extremities, however he reports decreased sensation in the left lower leg.     ED Results / Procedures / Treatments   Labs (all labs ordered are listed, but only abnormal results are displayed) Labs Reviewed - No data to display  EKG None  Radiology No results found.  Procedures Procedures   Medications Ordered in ED Medications - No data to display  ED Course  I have reviewed the triage vital signs and the nursing notes.  Pertinent labs & imaging results that were available during my care of the patient were reviewed by me and considered in my medical decision making (see chart for details).    MDM Rules/Calculators/A&P  Patient presenting here with complaints of a syncopal episode as described in the HPI.  He had recent admission and underwent extensive neurologic evaluation for generalized weakness.  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  Patient appears neurologically intact here outside of some generalized weakness.  His work-up is unremarkable with the exception of an elevated blood sugar.  Patient was given normal saline for this.  He seems appropriate for discharge as nothing here today seems acute.  He has an upcoming appointment with neurology in June.  Final Clinical Impression(s) / ED Diagnoses Final diagnoses:  None    Rx / DC Orders ED Discharge Orders    None       Veryl Speak, MD 01/09/21 [phone]

## 2021-01-08 NOTE — ED Notes (Signed)
EDP at bedside  

## 2021-01-08 NOTE — ED Triage Notes (Signed)
Brought by ems from home.  Reports he was coming back from the bathroom when he passed out and fell onto the bed.  Thinks he was out for a few minutes.  Now c/o tremors and sharp pain all over his body.  Hx of CIDP.  Feels like he may have had a seizure.  No hx of the same.  Also reports being a diabetic.  Hasn't started metformin yet.  Did take insulin today.  Per ems [id number].

## 2021-01-09 ENCOUNTER — Other Ambulatory Visit: Payer: Self-pay | Admitting: Family Medicine

## 2021-01-09 ENCOUNTER — Telehealth: Payer: Self-pay | Admitting: Pharmacist

## 2021-01-09 DIAGNOSIS — M545 Low back pain, unspecified: Secondary | ICD-10-CM

## 2021-01-09 DIAGNOSIS — R55 Syncope and collapse: Secondary | ICD-10-CM | POA: Diagnosis not present

## 2021-01-09 DIAGNOSIS — Z743 Need for continuous supervision: Secondary | ICD-10-CM | POA: Diagnosis not present

## 2021-01-09 DIAGNOSIS — G8929 Other chronic pain: Secondary | ICD-10-CM

## 2021-01-09 DIAGNOSIS — R5381 Other malaise: Secondary | ICD-10-CM | POA: Diagnosis not present

## 2021-01-09 LAB — COMPREHENSIVE METABOLIC PANEL
ALT: 18 U/L (ref 0–44)
AST: 15 U/L (ref 15–41)
Albumin: 3.5 g/dL (ref 3.5–5.0)
Alkaline Phosphatase: 71 U/L (ref 38–126)
Anion gap: 10 (ref 5–15)
BUN: 24 mg/dL — ABNORMAL HIGH (ref 6–20)
CO2: 23 mmol/L (ref 22–32)
Calcium: 8.8 mg/dL — ABNORMAL LOW (ref 8.9–10.3)
Chloride: 99 mmol/L (ref 98–111)
Creatinine, Ser: 1.08 mg/dL (ref 0.61–1.24)
GFR, Estimated: >60 mL/min (ref >60)
Glucose, Bld: 320 mg/dL — ABNORMAL HIGH (ref 70–99)
Potassium: 3.7 mmol/L (ref 3.5–5.1)
Sodium: 132 mmol/L — ABNORMAL LOW (ref 135–145)
Total Bilirubin: 0.5 mg/dL (ref 0.3–1.2)
Total Protein: 6.5 g/dL (ref 6.5–8.1)

## 2021-01-09 LAB — CBC WITH DIFFERENTIAL/PLATELET
Abs Immature Granulocytes: 0.02 10*3/uL (ref 0.00–0.07)
Basophils Absolute: 0 10*3/uL (ref 0.0–0.1)
Basophils Relative: 1 %
Eosinophils Absolute: 0.1 10*3/uL (ref 0.0–0.5)
Eosinophils Relative: 2 %
HCT: 39 % (ref 39.0–52.0)
Hemoglobin: 13.5 g/dL (ref 13.0–17.0)
Immature Granulocytes: 0 %
Lymphocytes Relative: 27 %
Lymphs Abs: 1.7 10*3/uL (ref 0.7–4.0)
MCH: 30.3 pg (ref 26.0–34.0)
MCHC: 34.6 g/dL (ref 30.0–36.0)
MCV: 87.4 fL (ref 80.0–100.0)
Monocytes Absolute: 0.6 10*3/uL (ref 0.1–1.0)
Monocytes Relative: 9 %
Neutro Abs: 3.8 10*3/uL (ref 1.7–7.7)
Neutrophils Relative %: 61 %
Platelets: 252 10*3/uL (ref 150–400)
RBC: 4.46 MIL/uL (ref 4.22–5.81)
RDW: 12.3 % (ref 11.5–15.5)
WBC: 6.3 10*3/uL (ref 4.0–10.5)
nRBC: 0 % (ref 0.0–0.2)

## 2021-01-09 MED ORDER — TRAMADOL HCL 50 MG PO TABS: ORAL_TABLET | ORAL | 1 refills | Status: DC

## 2021-01-09 MED ORDER — KETOROLAC TROMETHAMINE 30 MG/ML IJ SOLN
30.0000 mg | Freq: Once | INTRAMUSCULAR | Status: AC
  Administered 2021-01-09: 30 mg via INTRAVENOUS
  Filled 2021-01-09: qty 1

## 2021-01-09 NOTE — Discharge Instructions (Signed)
Follow-up with your neurologist as soon as possible

## 2021-01-09 NOTE — Chronic Care Management (AMB) (Signed)
Chronic Care Management Pharmacy Note  01/11/2021 Name:  Tyler Brewer MRN:  [id number] DOB:  [birth year]  Subjective: Tyler Brewer is an 60 y.o. year old male who is a primary patient of [name], DO.  The CCM team was consulted for assistance with disease management and care coordination needs.    Engaged with patient by telephone for initial visit in response to provider referral for pharmacy case management and/or care coordination services.   Consent to Services:  The patient was given the following information about Chronic Care Management services today, agreed to services, and gave verbal consent: 1. CCM service includes personalized support from designated clinical staff supervised by the primary care provider, including individualized plan of care and coordination with other care providers 2. 24/7 contact phone numbers for assistance for urgent and routine care needs. 3. Service will only be billed when office clinical staff spend 20 minutes or more in a month to coordinate care. 4. Only one practitioner may furnish and bill the service in a calendar month. 5.The patient may stop CCM services at any time (effective at the end of the month) by phone call to the office staff. 6. The patient will be responsible for cost sharing (co-pay) of up to 20% of the service fee (after annual deductible is met). Patient agreed to services and consent obtained.  Patient Care Team: Tyler Brewer, Alferd Apa, DO as PCP - General (Family Medicine) Alda Berthold, DO as Consulting Physician (Neurology) Lajuana Matte, MD as Consulting Physician (Cardiothoracic Surgery) Marlaine Hind, MD as Consulting Physician (Physical Medicine and Rehabilitation) Luretha Rued, RN as Case Manager Saporito, Maree Erie, LCSW as Social Worker (Licensed Clinical Social Worker) Tyler Brewer, PharmD (Pharmacist) [name], Melanie Crazier, MD as Consulting Physician (Endocrinology)  Recent  office visits: 11/28/2020 - PCP (Dr Etter Sjogren) [facility] f/u; c/o LEE; Refilled furosemide 49m take 1 tablet daily and tramadol 566m1 or 2 tablet every 8 hours. Referred to Endo for DM.  Recent consult visits: 01/03/2021 - Endo (Dr ShKelton PillarInitial consult for insulin dependent type 2 DM. Started Metformin 1 tablet daily with Breakfast for 1 week, then increase to 1 tablet with Breakfast and 1 tablet with Supper for  1 week, then increase to 2 tablets with Breakfast  and 1 tablet with Supper for another 1 week , then finally 2 Tablets with Breakfast and 2 tablets with Supper.  Increased Tresiba to 45 units daily. Increase Novolog to 12 units with each meal    Hospital visits: 01/05/2021 - ED Visit - right sided low back pain with sciatica 01/03/2021 - ED Visit - Near syncope and fall 12/10/2020 - ED visit - bilateral leg weakness  11/04/2020 to 11/21/2020- Hospitalization due to frequent falls and progressive bilateral lower extremity weakness since August 2021. Extensive w/u with suspension of CIDP. Started plasmapheresison 11/11/20 with last treatment 11/18/20. Referral to neurology.  Meds Started - hydralazine 5029mvery 8 hours Meds Stopped - naproxen sodium and OTC ibuprofen Meds Changed - diclofenac 1% gel - increased frequency of uese to 4 times daily; Increaesd gabapentin 300m39m take 2 tabs in am; 1 in afternoon and 1 at bedtime; Increased tramadol 50mg84m1 to 2 tablets every 8 hours as needed for pain    Objective:  Lab Results  Component Value Date   CREATININE 0.76 01/10/2021   CREATININE 1.08 01/09/2021   CREATININE 1.01 01/03/2021    Lab Results  Component Value Date   HGBA1C 9.9 (A)  01/03/2021   Last diabetic Eye exam: No results found for: HMDIABEYEEXA  Last diabetic Foot exam: No results found for: HMDIABFOOTEX      Component Value Date/Time   CHOL 118 11/28/2020 1502   TRIG 130.0 11/28/2020 1502   HDL 40.00 11/28/2020 1502   CHOLHDL 3 11/28/2020 1502   VLDL  26.0 11/28/2020 1502   LDLCALC 52 11/28/2020 1502   LDLDIRECT 124.0 05/02/2017 1507    Hepatic Function Latest Ref Rng & Units 01/10/2021 01/09/2021 01/03/2021  Total Protein 6.5 - 8.1 g/dL 7.0 6.5 7.4  Albumin 3.5 - 5.0 g/dL 3.9 3.5 3.9  AST 15 - 41 U/L '15 15 16  ' ALT 0 - 44 U/L '20 18 31  ' Alk Phosphatase 38 - 126 U/L 64 71 101  Total Bilirubin 0.3 - 1.2 mg/dL 0.4 0.5 0.2(L)    Lab Results  Component Value Date/Time   TSH 2.948 11/05/2020 07:18 AM   TSH 2.66 03/10/2020 10:06 AM   TSH 1.876 11/04/2019 10:02 AM   TSH 2.89 04/25/2016 07:57 AM    CBC Latest Ref Rng & Units 01/10/2021 01/09/2021 01/03/2021  WBC 4.0 - 10.5 K/uL 6.7 6.3 7.4  Hemoglobin 13.0 - 17.0 g/dL 14.1 13.5 14.5  Hematocrit 39.0 - 52.0 % 40.9 39.0 42.7  Platelets 150 - 400 K/uL 243 252 285    No results found for: VD25OH  Clinical ASCVD: Yes  The ASCVD Risk score Mikey Bussing DC Jr., et al., 2013) failed to calculate for the following reasons:   The valid total cholesterol range is 130 to 320 mg/dL     Social History   Tobacco Use  Smoking Status Current Every Day Smoker  . Packs/day: 0.50  . Types: Cigarettes  . Last attempt to quit: 09/29/2020  . Years since quitting: 0.2  Smokeless Tobacco Never Used   BP Readings from Last 3 Encounters:  01/10/21 (!) 159/75  01/09/21 (!) 156/94  01/05/21 (!) 154/91   Pulse Readings from Last 3 Encounters:  01/10/21 93  01/09/21 76  01/05/21 81   Wt Readings from Last 3 Encounters:  01/08/21 278 lb (126.1 kg)  01/03/21 278 lb (126.1 kg)  01/03/21 278 lb 8 oz (126.3 kg)    Assessment: Review of patient past medical history, allergies, medications, health status, including review of consultants reports, laboratory and other test data, was performed as part of comprehensive evaluation and provision of chronic care management services.   SDOH:  (Social Determinants of Health) assessments and interventions performed:  SDOH Interventions   Flowsheet Row Most Recent Value   SDOH Interventions   Financial Strain Interventions Other (Comment)  [assisted pt in applying for LIS and pt assistance programs for Insulin,  Pt also considering change to mail order if can lower med costs.]  Transportation Interventions Other (Comment)  [patient recently referred for assistance with transportation needs.]      CCM Care Plan  No Known Allergies  Medications Reviewed Today    Reviewed by Tyler Lien, RN (Registered Nurse) on 01/08/21 at 2311  Med List Status: <None>  Medication Order Taking? Sig Documenting Provider Last Dose Status Informant  acetaminophen (TYLENOL) 500 MG tablet [id number]  Take 500 mg by mouth every 6 (six) hours as needed. States he is taking one to two tablets a day [provider]  Active Self  amLODipine (NORVASC) 10 MG tablet [id number]  Take 1 tablet (10 mg total) by mouth daily. Thurnell Lose, MD  Active   Chlorpheniramine Maleate (ALLERGY RELIEF  PO) [id number]  Take 1 tablet by mouth daily as needed (allergies).  Patient not taking: Reported on 01/06/2021   [provider]  Active Self  diclofenac Sodium (VOLTAREN) 1 % GEL [id number]  Apply 2 g topically 4 (four) times daily. Corena Herter, PA-C  Active   DULoxetine (CYMBALTA) 30 MG capsule [id number]  Take 1 capsule (30 mg total) by mouth daily.  Patient taking differently: Take 30 mg by mouth 3 (three) times a week.   Tyler Brewer, Alferd Apa, DO  Active   furosemide (LASIX) 20 MG tablet [id number]  TAKE 1 TABLET(20 MG) BY MOUTH DAILY Tyler Brewer, Alferd Apa, DO  Active   gabapentin (NEURONTIN) 300 MG capsule [id number]  Take 2 tablets in the morning, 1 tablet in the afternoon, and 1 tablet at bedtime.  Patient taking differently: Take 300-600 mg by mouth See admin instructions. Take 600 mg in the morning, 300 mg in the afternoon and at bedtime   Narda Amber K, DO  Active   hydrALAZINE (APRESOLINE) 50 MG tablet [id number]  Take 1 tablet (50 mg total) by mouth every 8  (eight) hours. Thurnell Lose, MD  Active   ibuprofen (ADVIL) 200 MG tablet [id number]  Take 200 mg by mouth every 6 (six) hours as needed. [provider]  Active   insulin aspart (NOVOLOG FLEXPEN) 100 UNIT/ML FlexPen [id number]  Inject 12 Units into the skin 3 (three) times daily with meals. [name], Melanie Crazier, MD  Active   insulin degludec (TRESIBA FLEXTOUCH) 200 UNIT/ML FlexTouch Pen [id number]  Inject 46 Units into the skin daily. [name], Melanie Crazier, MD  Active   Insulin Pen Needle 31G X 5 MM MISC [id number]  1 Device by Does not apply route in the morning, at noon, in the evening, and at bedtime. [name], Melanie Crazier, MD  Active   Lidocaine HCl 1 % GEL [id number]  Apply 1 application topically as needed. Reports lidocaine cream 1% 2-3 times/day to lower back/knee and to hip occasionally.  Patient not taking: Reported on 01/06/2021   [provider]  Active Self           Med Note Antony Contras, [facility] B   Fri Jan 06, 2021 10:17 AM) Due to cost; OTC and not covered by insurance plan  losartan (COZAAR) 100 MG tablet [id number]  Take 1 tablet (100 mg total) by mouth daily. [name], DO  Active Self  Menthol, Topical Analgesic, (ICY HOT EX) [id number]  Apply topically as needed. [provider]  Active Self  metFORMIN (GLUCOPHAGE-XR) 500 MG 24 hr tablet [id number]  Take 2 tablets (1,000 mg total) by mouth in the morning and at bedtime.  Patient not taking: Reported on 01/06/2021   [name], Melanie Crazier, MD  Active            Med Note [facility] [place] B   Fri Jan 06, 2021  9:46 AM) Hasn't picked up from pharmacy yet  methocarbamol (ROBAXIN) 500 MG tablet [id number]  Take 1 tablet (500 mg total) by mouth 4 (four) times daily.  Patient taking differently: Take 500 mg by mouth every 6 (six) hours as needed for muscle spasms.   Tyler Brewer, Alferd Apa, DO  Active   nitrofurantoin, macrocrystal-monohydrate, (MACROBID) 100 MG capsule [id number]  TAKE  1 CAPSULE BY MOUTH IN THE MORNING AND 1 CAPSULE BY MOUTH IN THE EVENING FOR 5 DAYS [provider]  Active   pravastatin (PRAVACHOL) 40 MG tablet [id number]  TAKE ONE TABLET  BY MOUTH ONE TIME DAILY [name], DO  Active   tamsulosin (FLOMAX) 0.4 MG CAPS capsule [id number]  Take 1 capsule (0.4 mg total) by mouth daily. [name], DO  Active Self  tobramycin (TOBREX) 0.3 % ophthalmic ointment [id number]  Place into the left eye 3 (three) times daily.  Patient not taking: Reported on 01/06/2021   Thurnell Lose, MD  Active   traMADol Veatrice Bourbon) 50 MG tablet [id number]  TAKE ONE TO TWO TABLETS BY MOUTH EVERY 8 HOURS AS NEEDED FOR PAIN [name], DO  Active           Patient Active Problem List   Diagnosis Date Noted  . Diabetes mellitus ([place]) 01/03/2021  . Type 2 diabetes mellitus with hyperglycemia, with long-term current use of insulin ([place]) 01/03/2021  . Lower extremity edema 11/28/2020  . Chronic low back pain 11/28/2020  . Psoriatic arthritis ([place]) 11/28/2020  . Thoracic aortic aneurysm without rupture ([place]) 11/28/2020  . Bilateral leg weakness 11/05/2020  . Acute left-sided low back pain with right-sided sciatica 07/19/2020  . Hip pain, acute, left 07/05/2020  . Tremor 07/05/2020  . Weakness 07/05/2020  . Balance problem 03/11/2020  . Tinea cruris 03/11/2020  . Cellulitis of left groin 03/11/2020  . Acute pain of right shoulder 03/11/2020  . Interstitial lung disease ([place]) 05/02/2017  . Pansinusitis 09/26/2016  . Diabetic polyneuropathy associated with diabetes mellitus due to underlying condition ([place]) 05/08/2016  . Methamphetamine use disorder, severe, dependence ([place]) 02/11/2016  . Substance induced mood disorder ([place]) 02/11/2016  . Exertional dyspnea 11/30/2015  . ADD (attention deficit disorder) 09/29/2015  . Binge eating 09/29/2015  . Syncope 05/05/2012  . Diarrhea 05/05/2012  . Panic attacks 05/05/2012  . OSA (obstructive sleep  apnea) 05/05/2012  . HTN (hypertension) 05/05/2012  . Dyslipidemia 05/05/2012  . CAD (coronary artery disease)  cardiac cath with moderate disease in a septal branch of the ramus intermedius 04/01/2012  . Chest pain 04/01/2012  . Drug abuse and dependence ([place]) 04/01/2012  . Family history of coronary artery disease 03/31/2012  . Sleep apnea, on C-pap 03/31/2012  . Hyperlipemia 03/30/2012  . HTN (hypertension), benign 03/30/2012  . Morbid obesity ([place]) 03/30/2012  . DM type 2, uncontrolled, with neuropathy ([place]) 03/30/2012  . Depression with suicidal ideation 03/30/2012    Immunization History  Administered Date(s) Administered  . Tdap 08/20/2014    Conditions to be addressed/monitored: CAD, HTN, HLD, DMII and interstitial lung disease; OSA; chronic low back pain  Care Plan : General Pharmacy (Adult)  Updates made by Tyler Brewer, PHARMD since 01/11/2021 12:00 AM    Problem: Medication management and Chronic Care Management, support and education for the following conditions - HTN, Type2 DM with long term insuiln use; hyperlipidemia; neuropathy; OSA; institial lung disease;   Priority: High  Onset Date: 01/06/2021  Note:   Current Barriers:  . Unable to independently afford treatment regimen . Unable to independently monitor therapeutic efficacy . Unable to achieve control of type 2 DM and HTN  . Does not adhere to prescribed medication regimen . Frequent ED visits  Pharmacist Clinical Goal(s):  [name] Kitchen Over the next 90 days, patient will verbalize ability to afford treatment regimen . achieve adherence to monitoring guidelines and medication adherence to achieve therapeutic efficacy . achieve control of type 2 DM and HTN as evidenced by attainment of goals listed below . Decrease frequency of ED visits  through collaboration with PharmD and provider.  Interventions: . 1:1 collaboration with Tyler Brewer, Alferd Apa, DO regarding development and update of comprehensive plan of care  as evidenced by provider attestation and co-signature . Inter-disciplinary care team collaboration (see longitudinal plan of care) . Comprehensive medication review performed; medication list updated in electronic medical record  Diabetes: . Uncontrolled - (goal A1c <7%) . Current treatment: o Tresiba 46 units once daily  o Novolog 12 units three times a day with meals o Metformin XR 579m - take 2 tablets twice a day - new Rx from Dr SKelton Pillarfrom 01/03/2021 but patient has not picked up / started yet.  . Current glucose readings: Not currently checking; waiting to get his check next weeks so he can pay $18 copay to [employer] for DexCom CGM . Current meal patterns: Meals are variable; Patient states he is not able to cook much himself due to left weakness; He eats what is available.  . Social worker  is trying to get patient placed in rehab / SNF  . He is having difficulty with cost of insulin  . Current exercise: none due to leg weakness . Interventions:  o Recommended Take medications as directed by Dr SKelton Pillarand get DexCom as soon as able.  o Collaborated with [facility] pharmacy and patient's endocrinologist to find solutions for insulin costs - I checked with [facility] and the cost of TTyler AasU200 is $141 -for the 3 boxes written for this is a 78 days supply so insurance is charging 3 - $47 copays. If patient requests just 1 box at current those it would be a 39 days supply and he would pay $94 or 2 month copay.  Novolog was for 347mor 2 boxes and this was 83 day supply so cost to patient would be $141. If he got 1 box it would be 41 days supply and cost would decreased to $94.  - There is the option of finding a pharmacy that will break the box and dispense based on number of pens he needs for 30 days supply and cost would be $47 per month.  - Also could switch insulin to Relion NPH and R and [facility] for lower cost at [facility] o Assessed patient finances. Assisted patient in applying for  LIS which would possibly lower medication copays; Also will start process of applying for patient assistance for [place],    Hypertension: BP Readings from Last 3 Encounters:  01/09/21 (!) 156/94  01/05/21 (!) 154/91  01/04/21 (!) 164/85   . Uncontrolled - BP goal <140/90 . Current treatment: o Amlodipine 1043maily  o Hydralazine 60m36mery 8 hours o Losartan 100mg22mly  . Current home readings: does not check at home . Reports hypotensive like symptoms like falls and syncope but BP at ED visit has been elevated; Symptoms likely related to other cause . Interventions:  o Counseled on importance of getting BP to goal to prevent strokes and kidney damage o Collaborated with PCP staff for 90 DS refill for amlodipine o Assessed patient finances. And reviewed [employer] costs for mail order medications; Patient should be able to get most maintenance medications that are generic for $0 copay / 90 day supply from mail order or local pharmacy. Will continue to assist patient if he decides to set up mail order option  Hyperlipidemia / CAD: Lipid Panel     Component Value Date/Time   CHOL 118 11/28/2020 1502   TRIG 130.0 11/28/2020 1502   HDL 40.00 11/28/2020 1502  CHOLHDL 3 11/28/2020 1502   VLDL 26.0 11/28/2020 1502   LDLCALC 52 11/28/2020 1502   . Controlled - LDL goal <70 . Current treatment: o Pravastatin 52m at bedtime . Current dietary patterns: not following specific diet . Interventions:  o Counseled on LDL goals o Recommended continue current regimen for cholesterol  Chronic Pain / Leg Weakness: . Currently not controlled . Patient has had several ED visits for pain and weakness in the last month.  . He reports pain improves with pain medication given in ER but returns shortly after discharge . Patient has recent history of substance abuse.  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 Current treatment:  o Tramadol 533m- take 1 or 2 tablets up to every 8 hours as needed o Methocarbamol 50014m take 1 tablet 4 times a day as needed for muscle spasms o Voltaren Gel 1% -  apply to area of pain up to 4 times a day as needed . Interventions:  o Coordinated refill for tramadol o Encouraged patient to follow up with neurology o Encouraged patient to make appointment with pain management   Patient Goals/Self-Care Activities . Over the next 90 days, patient will:  take medications as prescribed, focus on medication adherence by utilizing pharmacy service that delivers medications to his home, check glucose 4 times daily or start to the CGM / DexCom, document, and provide at future appointments, and collaborate with provider on medication access solutions  Follow Up Plan: Telephone follow up appointment with care management team member scheduled for:  1 to 2 weeks and The care management team will reach out to the patient again over the next 14 days.        Medication Assistance: patient states he is having difficulty with cost of Tresiba and Novolog insulin.   Assisted patient in applying for LIS today - decision pending.  I checked with [facility] and the cost of TreTyler Aas00 is $141 -for the 3 boxes written for which is a 78 days supply so insurance is charging 3 - $47 copays. If patient requests just 1 box at current dose it would be a 39 days supply and he would pay $94 or 2 months copay.   Novolog was for 25m23m 2 boxes and this was 83 day supply so cost to patient would be $141. If he got 1 box it would be 41 days supply and cost would decreased to $94.   I have also started process of applying for patient assistance through [employer]Will continue to coordinate with Dr ShamKelton Pillartient and pharmacy to find lowest cost for insulins.    [facility]  [id number] [address] [place] [phone]: [phone]: [phone] [id number] [place] [id number]: [phone]: [phone] pill box? No -   Pt endorses 75% compliance  Follow Up:  Patient agrees to Care Plan and Follow-up.  Plan: Telephone follow up appointment with care management team member scheduled for:  1 to 2 weeks  Delesa Kawa  Antony Contras, PharmD Clinical Pharmacist [place] [phone]

## 2021-01-09 NOTE — Telephone Encounter (Signed)
-----   Message from Cloyd Stagers, MD sent at 01/06/2021  1:29 PM EDT ----- Regarding: RE: Insuiln cost We can use ReliON -N 22 units twice daily and if he is ok with Novolog $73 . Continue on that., The other option is switching Novolog to regular insulin ?  Let me know what I need to do on my end    ----- Message ----- From: Cherre Robins, PharmD Sent: 01/06/2021   1:16 PM EDT To: Cloyd Stagers, MD Subject: Insuiln cost                                   Good afternoon Dr [name],  I spoke with Mr. Tavano and I know he has sent you a message about insulin cost.  I checked with [facility] and the cost of Tyler Aas U200 is $141 -for the 3 boxes written for this is a 78 days supply so insurance is charging 3 - $47 copays. If patient requests just 1 box at current those it would be a 39 days supply and he would pay $94 or 2 month copay.   Novolog was for 77ml or 2 boxes and this was 83 day supply so cost to patient would be $141. If he got 1 box it would be 41 days supply and cost would decreased to $94.   There is the option of finding a pharmacy that will break the box and dispense based on number of pens he needs for 30 days supply and cost would be $47 per month.   I assisted patient with applying for low income subsidy today which could lower medications copays but will likely be 7 to 10 business days before we hear if he is approved or denied.   I have also started process of applying for patient assistance through [employer].   Another option is Relion NPH and R from [place] for around $25/vial or Relion / Novolog pens for around $73 per box.   Caitriona Sundquist

## 2021-01-09 NOTE — Telephone Encounter (Signed)
Options for insulin were discussed with patient.  He would like to continue with [place] and Novolog if possible. Will see if I can find a pharmacy that will break boxes of insulin pens.  If not he understands that using relion insulin NPH and R might be least expensive options.

## 2021-01-09 NOTE — Telephone Encounter (Signed)
Refill sent.

## 2021-01-10 ENCOUNTER — Emergency Department (HOSPITAL_COMMUNITY)
Admission: EM | Admit: 2021-01-10 | Discharge: 2021-01-10 | Disposition: A | Discharge status: Home / Self Care | Payer: Medicare HMO | Attending: Emergency Medicine | Admitting: Emergency Medicine

## 2021-01-10 ENCOUNTER — Other Ambulatory Visit: Payer: Self-pay

## 2021-01-10 ENCOUNTER — Other Ambulatory Visit: Payer: Self-pay | Admitting: Family Medicine

## 2021-01-10 ENCOUNTER — Encounter (HOSPITAL_COMMUNITY): Payer: Self-pay

## 2021-01-10 DIAGNOSIS — E1142 Type 2 diabetes mellitus with diabetic polyneuropathy: Secondary | ICD-10-CM | POA: Diagnosis not present

## 2021-01-10 DIAGNOSIS — R1031 Right lower quadrant pain: Secondary | ICD-10-CM | POA: Insufficient documentation

## 2021-01-10 DIAGNOSIS — I2510 Atherosclerotic heart disease of native coronary artery without angina pectoris: Secondary | ICD-10-CM | POA: Insufficient documentation

## 2021-01-10 DIAGNOSIS — Z79899 Other long term (current) drug therapy: Secondary | ICD-10-CM | POA: Diagnosis not present

## 2021-01-10 DIAGNOSIS — Z7984 Long term (current) use of oral hypoglycemic drugs: Secondary | ICD-10-CM | POA: Insufficient documentation

## 2021-01-10 DIAGNOSIS — I10 Essential (primary) hypertension: Secondary | ICD-10-CM | POA: Insufficient documentation

## 2021-01-10 DIAGNOSIS — Z794 Long term (current) use of insulin: Secondary | ICD-10-CM | POA: Diagnosis not present

## 2021-01-10 DIAGNOSIS — F17210 Nicotine dependence, cigarettes, uncomplicated: Secondary | ICD-10-CM | POA: Insufficient documentation

## 2021-01-10 DIAGNOSIS — R69 Illness, unspecified: Secondary | ICD-10-CM | POA: Diagnosis not present

## 2021-01-10 DIAGNOSIS — R0902 Hypoxemia: Secondary | ICD-10-CM | POA: Diagnosis not present

## 2021-01-10 DIAGNOSIS — Z743 Need for continuous supervision: Secondary | ICD-10-CM | POA: Diagnosis not present

## 2021-01-10 DIAGNOSIS — R1084 Generalized abdominal pain: Secondary | ICD-10-CM | POA: Diagnosis not present

## 2021-01-10 DIAGNOSIS — R531 Weakness: Secondary | ICD-10-CM | POA: Diagnosis not present

## 2021-01-10 DIAGNOSIS — R300 Dysuria: Secondary | ICD-10-CM | POA: Diagnosis not present

## 2021-01-10 LAB — CBC
HCT: 40.9 % (ref 39.0–52.0)
Hemoglobin: 14.1 g/dL (ref 13.0–17.0)
MCH: 30.4 pg (ref 26.0–34.0)
MCHC: 34.5 g/dL (ref 30.0–36.0)
MCV: 88.1 fL (ref 80.0–100.0)
Platelets: 243 10*3/uL (ref 150–400)
RBC: 4.64 MIL/uL (ref 4.22–5.81)
RDW: 12.7 % (ref 11.5–15.5)
WBC: 6.7 10*3/uL (ref 4.0–10.5)
nRBC: 0 % (ref 0.0–0.2)

## 2021-01-10 LAB — URINALYSIS, ROUTINE W REFLEX MICROSCOPIC
Bacteria, UA: NONE SEEN
Bilirubin Urine: NEGATIVE
Glucose, UA: >=500 mg/dL — AB
Hgb urine dipstick: NEGATIVE
Ketones, ur: NEGATIVE mg/dL
Leukocytes,Ua: NEGATIVE
Nitrite: NEGATIVE
Protein, ur: NEGATIVE mg/dL
Specific Gravity, Urine: 1.015 (ref 1.005–1.030)
pH: 5 (ref 5.0–8.0)

## 2021-01-10 LAB — COMPREHENSIVE METABOLIC PANEL
ALT: 20 U/L (ref 0–44)
AST: 15 U/L (ref 15–41)
Albumin: 3.9 g/dL (ref 3.5–5.0)
Alkaline Phosphatase: 64 U/L (ref 38–126)
Anion gap: 9 (ref 5–15)
BUN: 20 mg/dL (ref 6–20)
CO2: 24 mmol/L (ref 22–32)
Calcium: 9.3 mg/dL (ref 8.9–10.3)
Chloride: 102 mmol/L (ref 98–111)
Creatinine, Ser: 0.76 mg/dL (ref 0.61–1.24)
GFR, Estimated: >60 mL/min (ref >60)
Glucose, Bld: 182 mg/dL — ABNORMAL HIGH (ref 70–99)
Potassium: 4 mmol/L (ref 3.5–5.1)
Sodium: 135 mmol/L (ref 135–145)
Total Bilirubin: 0.4 mg/dL (ref 0.3–1.2)
Total Protein: 7 g/dL (ref 6.5–8.1)

## 2021-01-10 LAB — RAPID URINE DRUG SCREEN, HOSP PERFORMED
Amphetamines: NOT DETECTED
Barbiturates: NOT DETECTED
Benzodiazepines: NOT DETECTED
Cocaine: NOT DETECTED
Opiates: POSITIVE — AB
Tetrahydrocannabinol: NOT DETECTED

## 2021-01-10 LAB — LIPASE, BLOOD: Lipase: 52 U/L — ABNORMAL HIGH (ref 11–51)

## 2021-01-10 MED ORDER — OXYCODONE-ACETAMINOPHEN 5-325 MG PO TABS
1.0000 | ORAL_TABLET | Freq: Once | ORAL | Status: AC
  Administered 2021-01-10: 1 via ORAL
  Filled 2021-01-10: qty 1

## 2021-01-10 NOTE — ED Notes (Signed)
Pt given and ate [place] sandwich

## 2021-01-10 NOTE — ED Provider Notes (Signed)
[facility] DEPT Provider Note   CSN: [id number] Arrival date & time: 01/10/21  1243     History Chief Complaint  Patient presents with  . Abdominal Pain  . Prostate Pain    Tyler Brewer is a 60 y.o. male.  HPI Patient presents with groin pain.  States has had for a while now but worse last few days.  Has had weakness in his legs.  He is currently being treated for CIDP.  Had been admitted a couple months ago.  Had plasma exchange.  Had follow-up with neurology but had not been able to go.  Has had a few different ER to the visits for leg pain back pain.  Now states he has more pain in the groin area.  No fevers or chills.  States some dysuria.  No new weakness.  Hopefully is follow-up with Dr. Posey Pronto later this week. Patient is requesting a drug screen since states that he thinks someone may have slipped meth into his drink a couple days ago.    Past Medical History:  Diagnosis Date  . Arthritis   . CAD (coronary artery disease)  cardiac cath with moderate disease in a septal branch of the ramus intermedius 04/01/2012  . Depression   . Diabetes mellitus    poorly controlled by his report  . History of narcotic addiction ([place])    past history of back pain  . Hypercholesteremia   . Hypertension   . IBS (irritable bowel syndrome)   . 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[place])   . Neuropathy   . Obesity    Max weight was 390  . OSA on CPAP   . Panic attacks   . Testosterone deficiency   . Vertigo     Patient Active Problem List   Diagnosis Date Noted  . Diabetes mellitus ([place]) 01/03/2021  . Type 2 diabetes mellitus with hyperglycemia, with long-term current use of insulin ([place]) 01/03/2021  . Lower extremity edema 11/28/2020  . Chronic low back pain 11/28/2020  . Psoriatic arthritis ([place]) 11/28/2020  . Thoracic aortic aneurysm without rupture ([place]) 11/28/2020  . Bilateral leg weakness 11/05/2020  . Acute left-sided low back pain with right-sided  sciatica 07/19/2020  . Hip pain, acute, left 07/05/2020  . Tremor 07/05/2020  . Weakness 07/05/2020  . Balance problem 03/11/2020  . Tinea cruris 03/11/2020  . Cellulitis of left groin 03/11/2020  . Acute pain of right shoulder 03/11/2020  . Interstitial lung disease ([place]) 05/02/2017  . Pansinusitis 09/26/2016  . Diabetic polyneuropathy associated with diabetes mellitus due to underlying condition ([place]) 05/08/2016  . Methamphetamine use disorder, severe, dependence ([place]) 02/11/2016  . Substance induced mood disorder ([place]) 02/11/2016  . Exertional dyspnea 11/30/2015  . ADD (attention deficit disorder) 09/29/2015  . Binge eating 09/29/2015  . Syncope 05/05/2012  . Diarrhea 05/05/2012  . Panic attacks 05/05/2012  . OSA (obstructive sleep apnea) 05/05/2012  . HTN (hypertension) 05/05/2012  . Dyslipidemia 05/05/2012  . CAD (coronary artery disease)  cardiac cath with moderate disease in a septal branch of the ramus intermedius 04/01/2012  . Chest pain 04/01/2012  . Drug abuse and dependence ([place]) 04/01/2012  . Family history of coronary artery disease 03/31/2012  . Sleep apnea, on C-pap 03/31/2012  . Hyperlipemia 03/30/2012  . HTN (hypertension), benign 03/30/2012  . Morbid obesity ([place]) 03/30/2012  . DM type 2, uncontrolled, with neuropathy ([place]) 03/30/2012  . Depression with suicidal ideation 03/30/2012    Past Surgical History:  Procedure Laterality Date  .  CARDIAC CATHETERIZATION    . IR FLUORO GUIDE CV LINE RIGHT  11/10/2020  . IR US GUIDE VASC ACCESS RIGHT  11/10/2020  . LEFT HEART CATHETERIZATION WITH CORONARY ANGIOGRAM N/A 03/31/2012   Procedure: LEFT HEART CATHETERIZATION WITH CORONARY ANGIOGRAM;  Surgeon: Leonie Man, MD;  Location: [facility] CATH LAB;  Service: Cardiovascular;  Laterality: N/A;       Family History  Problem Relation Age of Onset  . Diabetes type II Father   . Hypertension Father   . Pancreatic disease Father 20       Deceased  . Healthy Mother   .  Healthy Sister   . Healthy Son   . Healthy Daughter   . Parkinson's disease Maternal Grandmother     Social History   Tobacco Use  . Smoking status: Current Every Day Smoker    Packs/day: 0.50    Types: Cigarettes    Last attempt to quit: 09/29/2020    Years since quitting: 0.2  . Smokeless tobacco: Never Used  Vaping Use  . Vaping Use: Never used  Substance Use Topics  . Alcohol use: No    Alcohol/week: 0.0 standard drinks  . Drug use: Not Currently    Types: Methamphetamines    Home Medications Prior to Admission medications   Medication Sig Start Date End Date Taking? Authorizing Provider  acetaminophen (TYLENOL) 500 MG tablet Take 500 mg by mouth every 6 (six) hours as needed. States he is taking one to two tablets a day    [provider]  amLODipine (NORVASC) 10 MG tablet Take 1 tablet (10 mg total) by mouth daily. 11/21/20   Thurnell Lose, MD  Chlorpheniramine Maleate (ALLERGY RELIEF PO) Take 1 tablet by mouth daily as needed (allergies). Patient not taking: Reported on 01/06/2021    [provider]  diclofenac Sodium (VOLTAREN) 1 % GEL Apply 2 g topically 4 (four) times daily. 08/27/20   Corena Herter, PA-C  DULoxetine (CYMBALTA) 30 MG capsule Take 1 capsule (30 mg total) by mouth daily. Patient taking differently: Take 30 mg by mouth 3 (three) times a week. 07/19/20   [name] R, DO  furosemide (LASIX) 20 MG tablet TAKE 1 TABLET(20 MG) BY MOUTH DAILY 12/21/20   Carollee Herter, Alferd Apa, DO  gabapentin (NEURONTIN) 300 MG capsule Take 2 tablets in the morning, 1 tablet in the afternoon, and 1 tablet at bedtime. Patient taking differently: Take 300-600 mg by mouth See admin instructions. Take 600 mg in the morning, 300 mg in the afternoon and at bedtime 10/27/20   Narda Amber K, DO  hydrALAZINE (APRESOLINE) 50 MG tablet Take 1 tablet (50 mg total) by mouth every 8 (eight) hours. 11/21/20   Thurnell Lose, MD  ibuprofen (ADVIL) 200 MG tablet Take  200 mg by mouth every 6 (six) hours as needed.    [provider]  insulin aspart (NOVOLOG FLEXPEN) 100 UNIT/ML FlexPen Inject 12 Units into the skin 3 (three) times daily with meals. 01/03/21   [name], Melanie Crazier, MD  insulin degludec (TRESIBA FLEXTOUCH) 200 UNIT/ML FlexTouch Pen Inject 46 Units into the skin daily. 01/03/21   [name], Melanie Crazier, MD  Insulin Pen Needle 31G X 5 MM MISC 1 Device by Does not apply route in the morning, at noon, in the evening, and at bedtime. 01/03/21   [name], Melanie Crazier, MD  Lidocaine HCl 1 % GEL Apply 1 application topically as needed. Reports lidocaine cream 1% 2-3 times/day to lower back/knee  and to hip occasionally. Patient not taking: Reported on 01/06/2021    [provider]  losartan (COZAAR) 100 MG tablet Take 1 tablet (100 mg total) by mouth daily. 07/05/20   [name], DO  Menthol, Topical Analgesic, (ICY HOT EX) Apply topically as needed.    [provider]  metFORMIN (GLUCOPHAGE-XR) 500 MG 24 hr tablet Take 2 tablets (1,000 mg total) by mouth in the morning and at bedtime. Patient not taking: Reported on 01/06/2021 01/03/21   [name], Melanie Crazier, MD  methocarbamol (ROBAXIN) 500 MG tablet Take 1 tablet (500 mg total) by mouth 4 (four) times daily. Patient taking differently: Take 500 mg by mouth every 6 (six) hours as needed for muscle spasms. 08/26/20   [name], DO  nitrofurantoin, macrocrystal-monohydrate, (MACROBID) 100 MG capsule TAKE 1 CAPSULE BY MOUTH IN THE MORNING AND 1 CAPSULE BY MOUTH IN THE EVENING FOR 5 DAYS 10/21/20   [provider]  pravastatin (PRAVACHOL) 40 MG tablet TAKE ONE TABLET BY MOUTH ONE TIME DAILY 12/26/20   Carollee Herter, Alferd Apa, DO  tamsulosin (FLOMAX) 0.4 MG CAPS capsule Take 1 capsule (0.4 mg total) by mouth daily. 03/10/20   [name], DO  tobramycin (TOBREX) 0.3 % ophthalmic ointment Place into the left eye 3 (three) times  daily. Patient not taking: Reported on 01/06/2021 11/21/20   Thurnell Lose, MD  traMADol (ULTRAM) 50 MG tablet TAKE ONE TO TWO TABLETS BY MOUTH EVERY 8 HOURS AS NEEDED FOR PAIN 01/09/21   [name], DO    Allergies    Patient has no known allergies.  Review of Systems   Review of Systems  Constitutional: Negative for appetite change.  Cardiovascular: Negative for chest pain.  Gastrointestinal: Negative for abdominal pain.  Genitourinary: Positive for dysuria.  Musculoskeletal: Positive for back pain.  Skin: Negative for rash.  Neurological: Negative for weakness.  Psychiatric/Behavioral: Negative for confusion.    Physical Exam Updated Vital Signs BP (!) 159/75 (BP Location: Right Arm)   Pulse 93   Temp 98 F (36.7 C) (Oral)   Resp 20   SpO2 99%   Physical Exam Vitals and nursing note reviewed.  HENT:     Head: Normocephalic.  Eyes:     Pupils: Pupils are equal, round, and reactive to light.  Cardiovascular:     Rate and Rhythm: Normal rate and regular rhythm.  Abdominal:     Palpations: There is no hepatomegaly or splenomegaly.     Hernia: No hernia is present.  Genitourinary:    Penis: Normal.      Testes: Normal.        Right: Mass not present.        Left: Mass not present.     Prostate: Not tender.     Rectum: Normal.  Skin:    General: Skin is warm.     Capillary Refill: Capillary refill takes less than 2 seconds.  Neurological:     Mental Status: He is alert and oriented to person, place, and time.     ED Results / Procedures / Treatments   Labs (all labs ordered are listed, but only abnormal results are displayed) Labs Reviewed  LIPASE, BLOOD - Abnormal; Notable for the following components:      Result Value   Lipase 52 (*)    All other components within normal limits  COMPREHENSIVE METABOLIC PANEL - Abnormal; Notable for the following components:   Glucose, Bld 182 (*)  All other components within normal limits  URINALYSIS,  ROUTINE W REFLEX MICROSCOPIC - Abnormal; Notable for the following components:   Glucose, UA >=500 (*)    All other components within normal limits  CBC  RAPID URINE DRUG SCREEN, HOSP PERFORMED    EKG None  Radiology No results found.  Procedures Procedures   Medications Ordered in ED Medications  oxyCODONE-acetaminophen (PERCOCET/ROXICET) 5-325 MG per tablet 1 tablet (1 tablet Oral Given 01/10/21 1553)    ED Course  I have reviewed the triage vital signs and the nursing notes.  Pertinent labs & imaging results that were available during my care of the patient were reviewed by me and considered in my medical decision making (see chart for details).    MDM Rules/Calculators/A&P                          Patient presents with groin pain.  Somewhat acute on chronic.  Benign exam.  Has had recent CT that was reassuring.  Lab work reassuring.  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  Had discussed with neurology about potential admission versus outpatient follow-up.  Patient states he thinks he can follow-up later this week with Dr. Posey Pronto.  Pain medicine given here and can follow-up as an outpatient.  Requested drug screen thinking that he was may be slipped some methamphetamine at his house.  It has been done but not resulted yet.  Discharge home with outpatient follow-up. Final Clinical Impression(s) / ED Diagnoses Final diagnoses:  Groin pain, right    Rx / DC Orders ED Discharge Orders    None       Davonna Belling, MD 01/10/21 1635

## 2021-01-10 NOTE — ED Notes (Signed)
Pt bladder scanned post void- scan showed 0 mL. Pt states he feels like his bladder is empty. Pt c/o pain to lower abdomen and prostate area.

## 2021-01-10 NOTE — ED Triage Notes (Signed)
Pt BIB EMS from home. Pt lives alone. Pt c/o sudden onset lower abdominal pain and prostate pain for last 2 hours. VSS. Pt ambulatory with cane at baseline.

## 2021-01-10 NOTE — Discharge Instructions (Addendum)
Continue your medicines.  Follow-up with neurology soon as possible.

## 2021-01-11 ENCOUNTER — Other Ambulatory Visit: Payer: Self-pay | Admitting: Family Medicine

## 2021-01-11 DIAGNOSIS — I2510 Atherosclerotic heart disease of native coronary artery without angina pectoris: Secondary | ICD-10-CM | POA: Diagnosis not present

## 2021-01-11 DIAGNOSIS — J849 Interstitial pulmonary disease, unspecified: Secondary | ICD-10-CM | POA: Diagnosis not present

## 2021-01-11 DIAGNOSIS — I10 Essential (primary) hypertension: Secondary | ICD-10-CM

## 2021-01-11 DIAGNOSIS — E1165 Type 2 diabetes mellitus with hyperglycemia: Secondary | ICD-10-CM | POA: Diagnosis not present

## 2021-01-11 DIAGNOSIS — G8929 Other chronic pain: Secondary | ICD-10-CM | POA: Diagnosis not present

## 2021-01-11 DIAGNOSIS — G4733 Obstructive sleep apnea (adult) (pediatric): Secondary | ICD-10-CM | POA: Diagnosis not present

## 2021-01-11 DIAGNOSIS — M5441 Lumbago with sciatica, right side: Secondary | ICD-10-CM | POA: Diagnosis not present

## 2021-01-11 DIAGNOSIS — E1121 Type 2 diabetes mellitus with diabetic nephropathy: Secondary | ICD-10-CM | POA: Diagnosis not present

## 2021-01-11 DIAGNOSIS — E7800 Pure hypercholesterolemia, unspecified: Secondary | ICD-10-CM | POA: Diagnosis not present

## 2021-01-11 DIAGNOSIS — E1142 Type 2 diabetes mellitus with diabetic polyneuropathy: Secondary | ICD-10-CM | POA: Diagnosis not present

## 2021-01-11 DIAGNOSIS — E1136 Type 2 diabetes mellitus with diabetic cataract: Secondary | ICD-10-CM | POA: Diagnosis not present

## 2021-01-11 MED ORDER — AMLODIPINE BESYLATE 10 MG PO TABS: 10.0000 mg | ORAL_TABLET | Freq: Every day | ORAL | 1 refills | Status: DC

## 2021-01-11 NOTE — Telephone Encounter (Cosign Needed)
[facility] in [place] and they verified they can break a box of insulin pens if needed.

## 2021-01-11 NOTE — Patient Instructions (Signed)
Visit Information Tyler Brewer,  It was a pleasure speaking with you today. Please feel free to contact me if you have any questions or concerns. Below is information regarding you health goals.  We will keep in touch over the next few weeks and we work on finding more affordable options for medications.  Cherre Robins, PharmD Clinical Pharmacist [facility] [address] [facility] [phone]  PATIENT GOALS: Goals Addressed            This Visit's Progress   . Chronic Care Management Pharmacy Care Plan       urrent Barriers:  . Unable to independently afford treatment regimen . Unable to independently monitor therapeutic efficacy . Unable to achieve control of type 2 diabetes and high blood pressure / hypertension . Does not adhere to prescribed medication regimen . Frequent ED visits  Pharmacist Clinical Goal(s):  [name] Kitchen Over the next 45 days, patient will verbalize ability to afford treatment regimen . achieve adherence to monitoring guidelines and medication adherence to achieve therapeutic efficacy . achieve control of type 2 diabetes and high blood pressure / hypertension as evidenced by attainment of goals listed below . Decrease frequency of ED visits through collaboration with PharmD and provider.   Interventions: . 1:1 collaboration with Tyler Brewer, Tyler Apa, DO regarding development and update of comprehensive plan of care as evidenced by provider attestation and co-signature . Inter-disciplinary care team collaboration (see longitudinal plan of care) . Comprehensive medication review performed; medication list updated in electronic medical record  Diabetes:  Lab Results  Component Value Date   HGBA1C 9.9 (A) 01/03/2021   . Uncontrolled - (goal A1c <7%) . Current treatment: o Tresiba 46 units once daily  o Novolog 12 units three times a day with meals o Metformin XR 500mg  - Start Metformin 1 tablet daily with Breakfast for 1 week, then increase to 1  tablet with Breakfast and 1 tablet with Supper for  1 week, then increase to 2 tablets with Breakfast  and 1 tablet with Supper for another 1 week , then finally 2 Tablets with Breakfast and 2 tablets with Supperake 2 tablets twice a day  . Interventions / Recommendations o Recommended take medications as directed by Tyler Brewer o Get DexCom as soon as able. If you need assistance with starting or setting up please let me know o Collaborated with [facility] pharmacy and patient's endocrinologist to find solutions for insulin costs - I checked with [facility] and the cost of Tyler Aas U200 is $141 -for the 3 boxes written for this is a 78 days supply so insurance is charging 3 - $47 copays. If patient requests just 1 box at current those it would be a 39 days supply and he would pay $94 or 2 month copay.  Novolog was for 3ml or 2 boxes and this was 83 day supply so cost to patient would be $141. If he got 1 box it would be 41 days supply and cost would decreased to $94.  - There is the option of finding a pharmacy that will break the box and dispense based on number of pens he needs for 30 days supply and cost would be $47 per month.  (I am checking around for this and will be in touch soon) - Also could switch insulin to Relion NPH and R and get for lower cost at [facility]. (about $25 per bottle) o Assessed patient finances. Assisted patient in applying for LIS which would possibly lower medication copays; Also  will start process of applying for patient assistance for [place] program o Notify me once you get letter regarding approval or denial of Low Income Subsidy (LIS)   Hypertension: BP Readings from Last 3 Encounters:  01/09/21 (!) 156/94  01/05/21 (!) 154/91  01/04/21 (!) 164/85   . Uncontrolled - BP goal <140/90 . Current treatment: o Amlodipine 10mg  daily  o Hydralazine 50mg  every 8 hours o Losartan 100mg  daily  . Interventions:  o Counseled on importance of getting blood pressure to goal to  prevent strokes and kidney damage o Collaborated with primary care staff for 90 day supply refill for amlodipine o Assessed patient finances. And reviewed [employer] costs for mail order medications; Patient should be able to get most maintenance medications that are generic for $0 copay / 90 day supply from mail order or local pharmacy. We will be working to transitions your generic maintenance medications over the [facility] order pharmacy.  Hyperlipidemia / Heart Disease: Lipid Panel     Component Value Date/Time   CHOL 118 11/28/2020 1502   TRIG 130.0 11/28/2020 1502   HDL 40.00 11/28/2020 1502   CHOLHDL 3 11/28/2020 1502   VLDL 26.0 11/28/2020 1502   LDLCALC 52 11/28/2020 1502   . Controlled - LDL goal <70 . Current treatment: o Pravastatin 40mg  at bedtime . Interventions:  o Counseled on LDL goals o Recommended continue current regimen for cholesterol  Chronic Pain / Leg Weakness: . Currently not controlled . Patient has had several ED visits for pain and weakness in the last month.  . Current treatment:  o Tramadol 50mg  - take 1 or 2 tablets up to every 8 hours as needed o Methocarbamol 500mg  - take 1 tablet 4 times a day as needed for muscle spasms o Voltaren Gel 1% -  apply to area of pain up to 4 times a day as needed . Interventions:  o Coordinated refill for tramadol  with primary care office o Encouraged patient to follow up with neurology o Encouraged patient to make appointment with pain management   Patient Goals/Self-Care Activities . Over the next 90 days, patient will:  o take medications as prescribed o focus on medication adherence by utilizing pharmacy service that delivers medications to his home, o check glucose 4 times daily or start to the CGM / DexCom, document, and provide at future appointments o collaborate with provider on medication access solutions  Follow Up Plan: Telephone follow up appointment with care management team member scheduled for:  1  to 2 weeks and The care management team will reach out to the patient again over the next 14 days.          The patient verbalized understanding of instructions, educational materials, and care plan provided today and declined offer to receive copy of patient instructions, educational materials, and care plan.

## 2021-01-12 ENCOUNTER — Ambulatory Visit (INDEPENDENT_AMBULATORY_CARE_PROVIDER_SITE_OTHER): Payer: Medicare HMO | Admitting: Family Medicine

## 2021-01-12 ENCOUNTER — Encounter: Payer: Self-pay | Admitting: Family Medicine

## 2021-01-12 ENCOUNTER — Other Ambulatory Visit: Payer: Self-pay

## 2021-01-12 VITALS — BP 140/90 | HR 84 | Temp 98.8°F | Resp 20 | Ht 71.0 in

## 2021-01-12 DIAGNOSIS — E785 Hyperlipidemia, unspecified: Secondary | ICD-10-CM

## 2021-01-12 DIAGNOSIS — Z794 Long term (current) use of insulin: Secondary | ICD-10-CM

## 2021-01-12 DIAGNOSIS — I10 Essential (primary) hypertension: Secondary | ICD-10-CM

## 2021-01-12 DIAGNOSIS — G6181 Chronic inflammatory demyelinating polyneuritis: Secondary | ICD-10-CM | POA: Diagnosis not present

## 2021-01-12 DIAGNOSIS — E1165 Type 2 diabetes mellitus with hyperglycemia: Secondary | ICD-10-CM

## 2021-01-12 NOTE — Assessment & Plan Note (Signed)
Neuro and pain management pending

## 2021-01-12 NOTE — Assessment & Plan Note (Signed)
Well controlled, no changes to meds. Encouraged heart healthy diet such as the DASH diet and exercise as tolerated.  °

## 2021-01-12 NOTE — Assessment & Plan Note (Signed)
Per endo Pt has received his dexcom con't meds

## 2021-01-12 NOTE — Patient Instructions (Signed)

## 2021-01-12 NOTE — Progress Notes (Signed)
Subjective:   By signing my name below, I, Tyler Brewer, attest that this documentation has been prepared under the direction and in the presence of Dr. [name], DO. 01/12/2021    Patient ID: Tyler Brewer, male    DOB: 1961/03/17, 60 y.o.   MRN: [id number]  Chief Complaint  Patient presents with  . Hypertension  . Diabetes  . Edema  . Follow-up    HPI Patient is in today for a office visit.  He complains of insomnia at night due to pain. He notes that he struggles sleeping at night when its quiet because he thinks too much which distracts him. He tried taking hot shower to help him fall asleep but finds no relief. He tried taking muscle relaxer's to manage his pain but finds mild relief and takes 300 mg gabapentin 2x during day and 1x during night PRN to manage his pain.  He reports that he continues to have transportation issues for his appointments. His blood pressure is elevated and he has not taken his medication today. His leg swelling has improved. He has not received his Covid-19 vaccine but is willing to get it today.  He has not completed his colonoscopy at this time. He is due on his shingles vaccine.    Past Medical History:  Diagnosis Date  . Arthritis   . CAD (coronary artery disease)  cardiac cath with moderate disease in a septal branch of the ramus intermedius 04/01/2012  . Depression   . Diabetes mellitus    poorly controlled by his report  . History of narcotic addiction ([place])    past history of back pain  . Hypercholesteremia   . Hypertension   . IBS (irritable bowel syndrome)   . 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 Neuropathy   . Obesity    Max weight was 390  . OSA on CPAP   . Panic attacks   . Testosterone deficiency   . Vertigo     Past Surgical History:  Procedure Laterality Date  . CARDIAC CATHETERIZATION    . IR FLUORO GUIDE CV LINE RIGHT  11/10/2020  . IR US GUIDE VASC ACCESS RIGHT  11/10/2020  . LEFT HEART CATHETERIZATION  WITH CORONARY ANGIOGRAM N/A 03/31/2012   Procedure: LEFT HEART CATHETERIZATION WITH CORONARY ANGIOGRAM;  Surgeon: Leonie Man, MD;  Location: [facility] CATH LAB;  Service: Cardiovascular;  Laterality: N/A;    Family History  Problem Relation Age of Onset  . Diabetes type II Father   . Hypertension Father   . Pancreatic disease Father 86       Deceased  . Healthy Mother   . Healthy Sister   . Healthy Son   . Healthy Daughter   . Parkinson's disease Maternal Grandmother     Social History   Socioeconomic History  . Marital status: Single    Spouse name: Not on file  . Number of children: 2  . Years of education: Not on file  . Highest education level: Not on file  Occupational History  . Not on file  Tobacco Use  . Smoking status: Current Every Day Smoker    Packs/day: 0.50    Types: Cigarettes    Last attempt to quit: 09/29/2020    Years since quitting: 0.2  . Smokeless tobacco: Never Used  Vaping Use  . Vaping Use: Never used  Substance and Sexual Activity  . Alcohol use: No    Alcohol/week: 0.0 standard drinks  . Drug use: Not Currently  Types: Methamphetamines  . Sexual activity: Yes  Other Topics Concern  . Not on file  Social History Narrative     Has 2 grown children.     Works as a Barrister's clerk.  Education: Oceanographer.   Right Handed   Drinks Caffeine    Mother recently moved and sold her house-- pt is living with a friend in a trailer    Social Determinants of Health   Financial Resource Strain: Medium Risk  . Difficulty of Paying Living Expenses: Somewhat hard  Food Insecurity: No Food Insecurity  . Worried About Charity fundraiser in the Last Year: Never true  . Ran Out of Food in the Last Year: Never true  Transportation Needs: Unmet Transportation Needs  . Lack of Transportation (Medical): Yes  . Lack of Transportation (Non-Medical): Yes  Physical Activity: Not on file  Stress: Not on file  Social Connections: Not on file  Intimate Partner  Violence: Not on file    Outpatient Medications Prior to Visit  Medication Sig Dispense Refill  . acetaminophen (TYLENOL) 500 MG tablet Take 500 mg by mouth every 6 (six) hours as needed. States he is taking one to two tablets a day    . amLODipine (NORVASC) 10 MG tablet Take 1 tablet (10 mg total) by mouth daily. 90 tablet 1  . diclofenac Sodium (VOLTAREN) 1 % GEL Apply 2 g topically 4 (four) times daily. 50 g 0  . DULoxetine (CYMBALTA) 30 MG capsule Take 1 capsule (30 mg total) by mouth daily. (Patient taking differently: Take 30 mg by mouth 3 (three) times a week.) 60 capsule 3  . furosemide (LASIX) 20 MG tablet TAKE 1 TABLET(20 MG) BY MOUTH DAILY 90 tablet 0  . gabapentin (NEURONTIN) 300 MG capsule Take 2 tablets in the morning, 1 tablet in the afternoon, and 1 tablet at bedtime. (Patient taking differently: Take 300-600 mg by mouth See admin instructions. Take 600 mg in the morning, 300 mg in the afternoon and at bedtime) 120 capsule 5  . hydrALAZINE (APRESOLINE) 50 MG tablet Take 1 tablet (50 mg total) by mouth every 8 (eight) hours. 90 tablet 0  . ibuprofen (ADVIL) 200 MG tablet Take 200 mg by mouth every 6 (six) hours as needed.    . insulin aspart (NOVOLOG FLEXPEN) 100 UNIT/ML FlexPen Inject 12 Units into the skin 3 (three) times daily with meals. 30 mL 2  . insulin degludec (TRESIBA FLEXTOUCH) 200 UNIT/ML FlexTouch Pen Inject 46 Units into the skin daily. 30 mL 2  . Insulin Pen Needle 31G X 5 MM MISC 1 Device by Does not apply route in the morning, at noon, in the evening, and at bedtime. 400 each 2  . losartan (COZAAR) 100 MG tablet Take 1 tablet (100 mg total) by mouth daily. 90 tablet 3  . Menthol, Topical Analgesic, (ICY HOT EX) Apply topically as needed.    . methocarbamol (ROBAXIN) 500 MG tablet TAKE ONE TABLET BY MOUTH FOUR TIMES A DAY 45 tablet 1  . nitrofurantoin, macrocrystal-monohydrate, (MACROBID) 100 MG capsule TAKE 1 CAPSULE BY MOUTH IN THE MORNING AND 1 CAPSULE BY MOUTH IN  THE EVENING FOR 5 DAYS    . pravastatin (PRAVACHOL) 40 MG tablet TAKE ONE TABLET BY MOUTH ONE TIME DAILY 30 tablet 2  . tamsulosin (FLOMAX) 0.4 MG CAPS capsule Take 1 capsule (0.4 mg total) by mouth daily. 90 capsule 3  . traMADol (ULTRAM) 50 MG tablet TAKE ONE TO TWO TABLETS BY MOUTH EVERY  8 HOURS AS NEEDED FOR PAIN 60 tablet 1  . metFORMIN (GLUCOPHAGE-XR) 500 MG 24 hr tablet Take 2 tablets (1,000 mg total) by mouth in the morning and at bedtime. (Patient not taking: No sig reported) 360 tablet 1  . tobramycin (TOBREX) 0.3 % ophthalmic ointment Place into the left eye 3 (three) times daily. (Patient not taking: No sig reported) 3.5 g 0   No facility-administered medications prior to visit.    No Known Allergies  Review of Systems  Constitutional: Negative for fever and malaise/fatigue.  HENT: Negative for congestion.   Eyes: Negative for blurred vision.  Respiratory: Negative for shortness of breath.   Cardiovascular: Negative for chest pain, palpitations and leg swelling.  Gastrointestinal: Negative for abdominal pain, blood in stool and nausea.  Genitourinary: Negative for dysuria and frequency.  Musculoskeletal: Positive for myalgias. Negative for falls.  Skin: Negative for rash.  Neurological: Negative for dizziness, loss of consciousness and headaches.  Endo/Heme/Allergies: Negative for environmental allergies.  Psychiatric/Behavioral: Negative for depression. The patient has insomnia. The patient is not nervous/anxious.        Objective:    Physical Exam Vitals and nursing note reviewed.  Constitutional:      General: He is not in acute distress.    Appearance: Normal appearance. He is not ill-appearing.  HENT:     Head: Normocephalic and atraumatic.     Right Ear: External ear normal.     Left Ear: External ear normal.  Eyes:     Extraocular Movements: Extraocular movements intact.     Pupils: Pupils are equal, round, and reactive to light.  Cardiovascular:     Rate  and Rhythm: Normal rate and regular rhythm.  Skin:    General: Skin is warm and dry.  Neurological:     Mental Status: He is alert and oriented to person, place, and time.  Psychiatric:        Behavior: Behavior normal.     BP 140/90 (BP Location: Left Arm, Patient Position: Sitting, Cuff Size: Large)   Pulse 84   Temp 98.8 F (37.1 C) (Oral)   Resp 20   Ht 5\' 11"  (1.803 m)   SpO2 98%   BMI 38.77 kg/m  Wt Readings from Last 3 Encounters:  01/08/21 278 lb (126.1 kg)  01/03/21 278 lb (126.1 kg)  01/03/21 278 lb 8 oz (126.3 kg)    Diabetic Foot Exam - Simple   Simple Foot Form Diabetic Foot exam was performed with the following findings: Yes 01/12/2021  5:17 PM  Visual Inspection No deformities, no ulcerations, no other skin breakdown bilaterally: Yes Sensation Testing Intact to touch and monofilament testing bilaterally: Yes Pulse Check Posterior Tibialis and Dorsalis pulse intact bilaterally: Yes Comments    Lab Results  Component Value Date   WBC 6.7 01/10/2021   HGB 14.1 01/10/2021   HCT 40.9 01/10/2021   PLT 243 01/10/2021   GLUCOSE 182 (H) 01/10/2021   CHOL 118 11/28/2020   TRIG 130.0 11/28/2020   HDL 40.00 11/28/2020   LDLDIRECT 124.0 05/02/2017   LDLCALC 52 11/28/2020   ALT 20 01/10/2021   AST 15 01/10/2021   NA 135 01/10/2021   K 4.0 01/10/2021   CL 102 01/10/2021   CREATININE 0.76 01/10/2021   BUN 20 01/10/2021   CO2 24 01/10/2021   TSH 2.948 11/05/2020   PSA 1.15 03/10/2020   INR 1.1 07/30/2013   HGBA1C 9.9 (A) 01/03/2021   MICROALBUR <0.7 05/02/2017    Lab Results  Component Value Date   TSH 2.948 11/05/2020   Lab Results  Component Value Date   WBC 6.7 01/10/2021   HGB 14.1 01/10/2021   HCT 40.9 01/10/2021   MCV 88.1 01/10/2021   PLT 243 01/10/2021   Lab Results  Component Value Date   NA 135 01/10/2021   K 4.0 01/10/2021   CO2 24 01/10/2021   GLUCOSE 182 (H) 01/10/2021   BUN 20 01/10/2021   CREATININE 0.76 01/10/2021    BILITOT 0.4 01/10/2021   ALKPHOS 64 01/10/2021   AST 15 01/10/2021   ALT 20 01/10/2021   PROT 7.0 01/10/2021   ALBUMIN 3.9 01/10/2021   CALCIUM 9.3 01/10/2021   ANIONGAP 9 01/10/2021   GFR 95.10 11/28/2020   Lab Results  Component Value Date   CHOL 118 11/28/2020   Lab Results  Component Value Date   HDL 40.00 11/28/2020   Lab Results  Component Value Date   LDLCALC 52 11/28/2020   Lab Results  Component Value Date   TRIG 130.0 11/28/2020   Lab Results  Component Value Date   CHOLHDL 3 11/28/2020   Lab Results  Component Value Date   HGBA1C 9.9 (A) 01/03/2021       Assessment & Plan:   Problem List Items Addressed This Visit      Unprioritized   CIDP (chronic inflammatory demyelinating polyneuropathy) (HCC)    Neuro and pain management pending       Diabetes mellitus ([place])   Relevant Orders   Comprehensive metabolic panel   Lipid panel   HTN (hypertension) (Chronic)    Well controlled, no changes to meds. Encouraged heart healthy diet such as the DASH diet and exercise as tolerated.       Relevant Orders   Comprehensive metabolic panel   Lipid panel   Hyperlipemia - Primary (Chronic)   Relevant Orders   Comprehensive metabolic panel   Lipid panel   Type 2 diabetes mellitus with hyperglycemia, with long-term current use of insulin ([place])    Per endo Pt has received his dexcom con't meds           No orders of the defined types were placed in this encounter.   I, Dr. [name], DO, personally preformed the services described in this documentation.  All medical record entries made by the scribe were at my direction and in my presence.  I have reviewed the chart and discharge instructions (if applicable) and agree that the record reflects my personal performance and is accurate and complete. 01/12/2021   I,Tyler Brewer,acting as a scribe for [name], DO.,have documented all relevant documentation on the behalf of [name], DO,as directed by  [name], DO while in the presence of [name], DO.   [name], DO

## 2021-01-12 NOTE — Assessment & Plan Note (Signed)
Encouraged heart healthy diet, increase exercise, avoid trans fats, consider a krill oil cap daily 

## 2021-01-12 NOTE — Telephone Encounter (Signed)
Spoke with patient when he was in office today about possibly using [facility] for insulin. Patient reports his mother help he purchase 1 box of both Novolog and [place] U200 pens. He should have enough insulin for about 6 weeks so this will give [place] a chance to get decision from LIS application and also to apply for PAP with NovoNordisk if LIS is denied.  He is open to using [facility] in future if needed.

## 2021-01-13 ENCOUNTER — Emergency Department (HOSPITAL_COMMUNITY)
Admission: EM | Admit: 2021-01-13 | Discharge: 2021-01-13 | Disposition: A | Discharge status: Home / Self Care | Payer: Medicare HMO | Attending: Emergency Medicine | Admitting: Emergency Medicine

## 2021-01-13 ENCOUNTER — Encounter (HOSPITAL_COMMUNITY): Payer: Self-pay

## 2021-01-13 ENCOUNTER — Telehealth: Payer: Medicare HMO | Admitting: *Deleted

## 2021-01-13 ENCOUNTER — Telehealth: Payer: Self-pay | Admitting: *Deleted

## 2021-01-13 DIAGNOSIS — R69 Illness, unspecified: Secondary | ICD-10-CM | POA: Diagnosis not present

## 2021-01-13 DIAGNOSIS — E1142 Type 2 diabetes mellitus with diabetic polyneuropathy: Secondary | ICD-10-CM | POA: Insufficient documentation

## 2021-01-13 DIAGNOSIS — F17210 Nicotine dependence, cigarettes, uncomplicated: Secondary | ICD-10-CM | POA: Diagnosis not present

## 2021-01-13 DIAGNOSIS — M5442 Lumbago with sciatica, left side: Secondary | ICD-10-CM | POA: Insufficient documentation

## 2021-01-13 DIAGNOSIS — Z955 Presence of coronary angioplasty implant and graft: Secondary | ICD-10-CM | POA: Insufficient documentation

## 2021-01-13 DIAGNOSIS — Z743 Need for continuous supervision: Secondary | ICD-10-CM | POA: Diagnosis not present

## 2021-01-13 DIAGNOSIS — Z794 Long term (current) use of insulin: Secondary | ICD-10-CM | POA: Insufficient documentation

## 2021-01-13 DIAGNOSIS — M545 Low back pain, unspecified: Secondary | ICD-10-CM | POA: Diagnosis present

## 2021-01-13 DIAGNOSIS — Z79899 Other long term (current) drug therapy: Secondary | ICD-10-CM | POA: Insufficient documentation

## 2021-01-13 DIAGNOSIS — M549 Dorsalgia, unspecified: Secondary | ICD-10-CM | POA: Diagnosis not present

## 2021-01-13 DIAGNOSIS — I10 Essential (primary) hypertension: Secondary | ICD-10-CM | POA: Diagnosis not present

## 2021-01-13 DIAGNOSIS — Z7984 Long term (current) use of oral hypoglycemic drugs: Secondary | ICD-10-CM | POA: Diagnosis not present

## 2021-01-13 DIAGNOSIS — E1165 Type 2 diabetes mellitus with hyperglycemia: Secondary | ICD-10-CM | POA: Insufficient documentation

## 2021-01-13 DIAGNOSIS — I2510 Atherosclerotic heart disease of native coronary artery without angina pectoris: Secondary | ICD-10-CM | POA: Diagnosis not present

## 2021-01-13 DIAGNOSIS — E1140 Type 2 diabetes mellitus with diabetic neuropathy, unspecified: Secondary | ICD-10-CM | POA: Insufficient documentation

## 2021-01-13 DIAGNOSIS — M5441 Lumbago with sciatica, right side: Secondary | ICD-10-CM | POA: Insufficient documentation

## 2021-01-13 MED ORDER — HYDROMORPHONE HCL 1 MG/ML IJ SOLN
1.0000 mg | Freq: Once | INTRAMUSCULAR | Status: AC
  Administered 2021-01-13: 1 mg via INTRAMUSCULAR
  Filled 2021-01-13: qty 1

## 2021-01-13 MED ORDER — KETOROLAC TROMETHAMINE 30 MG/ML IJ SOLN
30.0000 mg | Freq: Once | INTRAMUSCULAR | Status: AC
  Administered 2021-01-13: 30 mg via INTRAMUSCULAR
  Filled 2021-01-13: qty 1

## 2021-01-13 NOTE — ED Notes (Signed)
Pt discharged from this ED in stable condition at this time. All discharge instructions and follow up care reviewed with pt with no further questions at this time. Pt ambulatory to baseline, clear speech.   

## 2021-01-13 NOTE — ED Triage Notes (Signed)
Pt arrived via EMS, from home, c/o lower back pain worsening the last couple days. Non traumatic. Hx of chronic back pain. States he takes tramadol and robaxin for it with no relief now.

## 2021-01-13 NOTE — ED Provider Notes (Signed)
[facility] DEPT Provider Note   CSN: [id number] Arrival date & time: 01/13/21  1000     History Chief Complaint  Patient presents with  . Back Pain    Tyler Brewer is a 60 y.o. male.  Patient presents with lower back pain.  He states has had it for several months and has been persistent.  He has been seen in the ER a few times for this in the past he states.  He had imaging done that did not really point to a clear cause he states.  He is scheduled to see pain management for his back pain next week.  He states that over the course the last 3 days the pain is gone to the point where his medications at home have not really helped.  He denies any new fall or any new trauma.  Denies fevers vomiting cough or diarrhea.  Denies any new numbness or weakness.  Denies bowel or bladder dysfunction.  He states this morning his legs just felt like they hurt too much to move.        Past Medical History:  Diagnosis Date  . Arthritis   . CAD (coronary artery disease)  cardiac cath with moderate disease in a septal branch of the ramus intermedius 04/01/2012  . Depression   . Diabetes mellitus    poorly controlled by his report  . History of narcotic addiction ([place])    past history of back pain  . Hypercholesteremia   . Hypertension   . IBS (irritable bowel syndrome)   . 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 Neuropathy   . Obesity    Max weight was 390  . OSA on CPAP   . Panic attacks   . Testosterone deficiency   . Vertigo     Patient Active Problem List   Diagnosis Date Noted  . CIDP (chronic inflammatory demyelinating polyneuropathy) ([place]) 01/12/2021  . Diabetes mellitus ([place]) 01/03/2021  . Type 2 diabetes mellitus with hyperglycemia, with long-term current use of insulin ([place]) 01/03/2021  . Lower extremity edema 11/28/2020  . Chronic low back pain 11/28/2020  . Psoriatic arthritis ([place]) 11/28/2020  . Thoracic aortic aneurysm without rupture ([place])  11/28/2020  . Bilateral leg weakness 11/05/2020  . Acute left-sided low back pain with right-sided sciatica 07/19/2020  . Hip pain, acute, left 07/05/2020  . Tremor 07/05/2020  . Weakness 07/05/2020  . Balance problem 03/11/2020  . Tinea cruris 03/11/2020  . Cellulitis of left groin 03/11/2020  . Acute pain of right shoulder 03/11/2020  . Interstitial lung disease ([place]) 05/02/2017  . Pansinusitis 09/26/2016  . Diabetic polyneuropathy associated with diabetes mellitus due to underlying condition ([place]) 05/08/2016  . Methamphetamine use disorder, severe, dependence ([place]) 02/11/2016  . Substance induced mood disorder ([place]) 02/11/2016  . Exertional dyspnea 11/30/2015  . ADD (attention deficit disorder) 09/29/2015  . Binge eating 09/29/2015  . Syncope 05/05/2012  . Diarrhea 05/05/2012  . Panic attacks 05/05/2012  . OSA (obstructive sleep apnea) 05/05/2012  . HTN (hypertension) 05/05/2012  . Dyslipidemia 05/05/2012  . CAD (coronary artery disease)  cardiac cath with moderate disease in a septal branch of the ramus intermedius 04/01/2012  . Chest pain 04/01/2012  . Drug abuse and dependence ([place]) 04/01/2012  . Family history of coronary artery disease 03/31/2012  . Sleep apnea, on C-pap 03/31/2012  . Hyperlipemia 03/30/2012  . HTN (hypertension), benign 03/30/2012  . Morbid obesity ([place]) 03/30/2012  . DM type 2, uncontrolled, with neuropathy ([place])  03/30/2012  . Depression with suicidal ideation 03/30/2012    Past Surgical History:  Procedure Laterality Date  . CARDIAC CATHETERIZATION    . IR FLUORO GUIDE CV LINE RIGHT  11/10/2020  . IR US GUIDE VASC ACCESS RIGHT  11/10/2020  . LEFT HEART CATHETERIZATION WITH CORONARY ANGIOGRAM N/A 03/31/2012   Procedure: LEFT HEART CATHETERIZATION WITH CORONARY ANGIOGRAM;  Surgeon: Leonie Man, MD;  Location: [facility] CATH LAB;  Service: Cardiovascular;  Laterality: N/A;       Family History  Problem Relation Age of Onset  . Diabetes type II  Father   . Hypertension Father   . Pancreatic disease Father 62       Deceased  . Healthy Mother   . Healthy Sister   . Healthy Son   . Healthy Daughter   . Parkinson's disease Maternal Grandmother     Social History   Tobacco Use  . Smoking status: Current Every Day Smoker    Packs/day: 0.50    Types: Cigarettes    Last attempt to quit: 09/29/2020    Years since quitting: 0.2  . Smokeless tobacco: Never Used  Vaping Use  . Vaping Use: Never used  Substance Use Topics  . Alcohol use: No    Alcohol/week: 0.0 standard drinks  . Drug use: Not Currently    Types: Methamphetamines    Home Medications Prior to Admission medications   Medication Sig Start Date End Date Taking? Authorizing Provider  acetaminophen (TYLENOL) 500 MG tablet Take 500 mg by mouth every 6 (six) hours as needed. States he is taking one to two tablets a day    [provider]  amLODipine (NORVASC) 10 MG tablet Take 1 tablet (10 mg total) by mouth daily. 01/11/21   [name], DO  diclofenac Sodium (VOLTAREN) 1 % GEL Apply 2 g topically 4 (four) times daily. 08/27/20   Corena Herter, PA-C  DULoxetine (CYMBALTA) 30 MG capsule Take 1 capsule (30 mg total) by mouth daily. Patient taking differently: Take 30 mg by mouth 3 (three) times a week. 07/19/20   [name] R, DO  furosemide (LASIX) 20 MG tablet TAKE 1 TABLET(20 MG) BY MOUTH DAILY 12/21/20   Carollee Herter, Alferd Apa, DO  gabapentin (NEURONTIN) 300 MG capsule Take 2 tablets in the morning, 1 tablet in the afternoon, and 1 tablet at bedtime. Patient taking differently: Take 300-600 mg by mouth See admin instructions. Take 600 mg in the morning, 300 mg in the afternoon and at bedtime 10/27/20   Narda Amber K, DO  hydrALAZINE (APRESOLINE) 50 MG tablet Take 1 tablet (50 mg total) by mouth every 8 (eight) hours. 11/21/20   Thurnell Lose, MD  ibuprofen (ADVIL) 200 MG tablet Take 200 mg by mouth every 6 (six) hours as needed.    [provider]  insulin aspart (NOVOLOG FLEXPEN) 100 UNIT/ML FlexPen Inject 12 Units into the skin 3 (three) times daily with meals. 01/03/21   [name], Melanie Crazier, MD  insulin degludec (TRESIBA FLEXTOUCH) 200 UNIT/ML FlexTouch Pen Inject 46 Units into the skin daily. 01/03/21   [name], Melanie Crazier, MD  Insulin Pen Needle 31G X 5 MM MISC 1 Device by Does not apply route in the morning, at noon, in the evening, and at bedtime. 01/03/21   [name], Melanie Crazier, MD  losartan (COZAAR) 100 MG tablet Take 1 tablet (100 mg total) by mouth daily. 07/05/20   [name], DO  Menthol, Topical Analgesic, (  ICY HOT EX) Apply topically as needed.    [provider]  metFORMIN (GLUCOPHAGE-XR) 500 MG 24 hr tablet Take 2 tablets (1,000 mg total) by mouth in the morning and at bedtime. Patient not taking: No sig reported 01/03/21   [name], Melanie Crazier, MD  methocarbamol (ROBAXIN) 500 MG tablet TAKE ONE TABLET BY MOUTH FOUR TIMES A DAY 01/11/21   [place] Chase, Alferd Apa, DO  nitrofurantoin, macrocrystal-monohydrate, (MACROBID) 100 MG capsule TAKE 1 CAPSULE BY MOUTH IN THE MORNING AND 1 CAPSULE BY MOUTH IN THE EVENING FOR 5 DAYS 10/21/20   [provider]  pravastatin (PRAVACHOL) 40 MG tablet TAKE ONE TABLET BY MOUTH ONE TIME DAILY 12/26/20   [name], DO  tamsulosin (FLOMAX) 0.4 MG CAPS capsule Take 1 capsule (0.4 mg total) by mouth daily. 03/10/20   [name], DO  tobramycin (TOBREX) 0.3 % ophthalmic ointment Place into the left eye 3 (three) times daily. Patient not taking: No sig reported 11/21/20   Thurnell Lose, MD  traMADol (ULTRAM) 50 MG tablet TAKE ONE TO TWO TABLETS BY MOUTH EVERY 8 HOURS AS NEEDED FOR PAIN 01/09/21   [name], DO    Allergies    Patient has no known allergies.  Review of Systems   Review of Systems  Constitutional: Negative for fever.  HENT: Negative for ear pain and sore throat.   Eyes: Negative for  pain.  Respiratory: Negative for cough.   Cardiovascular: Negative for chest pain.  Gastrointestinal: Negative for abdominal pain.  Genitourinary: Negative for flank pain.  Musculoskeletal: Positive for back pain.  Skin: Negative for color change and rash.  Neurological: Negative for syncope.  All other systems reviewed and are negative.   Physical Exam Updated Vital Signs BP (!) 173/95 (BP Location: Left Arm)   Pulse 81   Temp 98.4 F (36.9 C) (Oral)   Resp 18   SpO2 95%   Physical Exam Constitutional:      General: He is not in acute distress.    Appearance: He is well-developed.  HENT:     Head: Normocephalic.     Nose: Nose normal.  Eyes:     Extraocular Movements: Extraocular movements intact.  Cardiovascular:     Rate and Rhythm: Normal rate.  Pulmonary:     Effort: Pulmonary effort is normal.  Skin:    Coloration: Skin is not jaundiced.  Neurological:     General: No focal deficit present.     Mental Status: He is alert and oriented to person, place, and time. Mental status is at baseline.     Comments: Currently the patient states he feels better when he is laying down.  He is able to move all extremities 5/5 strength.  No focal neurodeficit noted.  No saddle anesthesia noted.     ED Results / Procedures / Treatments   Labs (all labs ordered are listed, but only abnormal results are displayed) Labs Reviewed - No data to display  EKG None  Radiology No results found.  Procedures Procedures   Medications Ordered in ED Medications  HYDROmorphone (DILAUDID) injection 1 mg (1 mg Intramuscular Given 01/13/21 1040)  ketorolac (TORADOL) 30 MG/ML injection 30 mg (30 mg Intramuscular Given 01/13/21 1039)    ED Course  I have reviewed the triage vital signs and the nursing notes.  Pertinent labs & imaging results that were available during my care of the patient were reviewed by me and considered in my medical  decision making (see chart for details).     MDM Rules/Calculators/A&P                          Patient has acute exacerbation of his chronic lower back pain.  Denies any new numbness or weakness or bowel or bladder dysfunction.  Given Dilaudid and Toradol which he typically states improves his pain.  He has an appointment with pain specialist within the week which I advised to keep.  Advising immediate return for new weakness or numbness or bowel or bladder dysfunction or any additional concerns.  Final Clinical Impression(s) / ED Diagnoses Final diagnoses:  Midline low back pain with bilateral sciatica, unspecified chronicity    Rx / DC Orders ED Discharge Orders    None       Luna Fuse, MD 01/13/21 1106

## 2021-01-13 NOTE — Discharge Instructions (Addendum)
Call your primary care doctor or specialist as discussed in the next 2-3 days.   Return immediately back to the ER if:  Your symptoms worsen within the next 12-24 hours. You develop new symptoms such as new fevers, persistent vomiting, new pain, shortness of breath, or new weakness or numbness, or if you have any other concerns.  

## 2021-01-13 NOTE — Telephone Encounter (Signed)
  Care Management   Follow Up Note   01/13/2021 Name: Tyler Brewer MRN: [id number] DOB: 08-Jul-1961   Referred by: [name], DO Reason for referral : Chronic Care Management for Patient with History of Falls/Fall Risk, Unsteady Gait, Impaired Mobility, Weakness, Tremor, Syncope, Balance Problem and Lower Extremity Edema. Unsuccessful Follow-Up Outreach Call Attempt.  LCSW receiving an incoming text message from patient this morning at 9:44am, indicating that he was in route to the [facility] Emergency Department.  LCSW made several attempts to try and contact patient, but without success.  HIPAA compliant messages have been left on voicemail for patient, as LCSW continues to await a return call.  LCSW will monitor patient via Epic, while at the Emergency Department, then resume social work services once patient is discharged back into the community.  LCSW has faxed patient's FL-2 Form within a 100-mile radius, but no bed offers have been received thus far.  LCSW will make a second outreach attempt again next week, if a return call is not received from patient in the meantime.  Follow-Up Plan: 01/19/2021 at 8:30am.  Nat Christen LCSW Licensed Clinical Social Worker [place] [phone]

## 2021-01-16 ENCOUNTER — Emergency Department (HOSPITAL_COMMUNITY)
Admission: EM | Admit: 2021-01-16 | Discharge: 2021-01-16 | Disposition: A | Discharge status: Home / Self Care | Payer: Medicare HMO | Attending: Emergency Medicine | Admitting: Emergency Medicine

## 2021-01-16 ENCOUNTER — Other Ambulatory Visit: Payer: Self-pay

## 2021-01-16 ENCOUNTER — Encounter (HOSPITAL_BASED_OUTPATIENT_CLINIC_OR_DEPARTMENT_OTHER): Payer: Self-pay | Admitting: *Deleted

## 2021-01-16 ENCOUNTER — Encounter (HOSPITAL_COMMUNITY): Payer: Self-pay | Admitting: Emergency Medicine

## 2021-01-16 ENCOUNTER — Emergency Department (HOSPITAL_BASED_OUTPATIENT_CLINIC_OR_DEPARTMENT_OTHER)
Admission: EM | Admit: 2021-01-16 | Discharge: 2021-01-16 | Disposition: A | Discharge status: Home / Self Care | Payer: Medicare HMO | Source: Home / Self Care | Attending: Emergency Medicine | Admitting: Emergency Medicine

## 2021-01-16 DIAGNOSIS — G8929 Other chronic pain: Secondary | ICD-10-CM | POA: Insufficient documentation

## 2021-01-16 DIAGNOSIS — I10 Essential (primary) hypertension: Secondary | ICD-10-CM | POA: Insufficient documentation

## 2021-01-16 DIAGNOSIS — Z794 Long term (current) use of insulin: Secondary | ICD-10-CM | POA: Insufficient documentation

## 2021-01-16 DIAGNOSIS — R2240 Localized swelling, mass and lump, unspecified lower limb: Secondary | ICD-10-CM | POA: Insufficient documentation

## 2021-01-16 DIAGNOSIS — M545 Low back pain, unspecified: Secondary | ICD-10-CM | POA: Insufficient documentation

## 2021-01-16 DIAGNOSIS — Z79899 Other long term (current) drug therapy: Secondary | ICD-10-CM | POA: Insufficient documentation

## 2021-01-16 DIAGNOSIS — I2510 Atherosclerotic heart disease of native coronary artery without angina pectoris: Secondary | ICD-10-CM | POA: Insufficient documentation

## 2021-01-16 DIAGNOSIS — E785 Hyperlipidemia, unspecified: Secondary | ICD-10-CM | POA: Insufficient documentation

## 2021-01-16 DIAGNOSIS — Z7984 Long term (current) use of oral hypoglycemic drugs: Secondary | ICD-10-CM | POA: Insufficient documentation

## 2021-01-16 DIAGNOSIS — M5441 Lumbago with sciatica, right side: Secondary | ICD-10-CM | POA: Diagnosis not present

## 2021-01-16 DIAGNOSIS — M79609 Pain in unspecified limb: Secondary | ICD-10-CM | POA: Diagnosis not present

## 2021-01-16 DIAGNOSIS — M5442 Lumbago with sciatica, left side: Secondary | ICD-10-CM | POA: Diagnosis not present

## 2021-01-16 DIAGNOSIS — M549 Dorsalgia, unspecified: Secondary | ICD-10-CM | POA: Diagnosis not present

## 2021-01-16 DIAGNOSIS — E1169 Type 2 diabetes mellitus with other specified complication: Secondary | ICD-10-CM | POA: Insufficient documentation

## 2021-01-16 DIAGNOSIS — R600 Localized edema: Secondary | ICD-10-CM | POA: Diagnosis not present

## 2021-01-16 DIAGNOSIS — E1140 Type 2 diabetes mellitus with diabetic neuropathy, unspecified: Secondary | ICD-10-CM | POA: Diagnosis not present

## 2021-01-16 DIAGNOSIS — F17210 Nicotine dependence, cigarettes, uncomplicated: Secondary | ICD-10-CM | POA: Insufficient documentation

## 2021-01-16 DIAGNOSIS — Z743 Need for continuous supervision: Secondary | ICD-10-CM | POA: Diagnosis not present

## 2021-01-16 DIAGNOSIS — R69 Illness, unspecified: Secondary | ICD-10-CM | POA: Diagnosis not present

## 2021-01-16 DIAGNOSIS — R5381 Other malaise: Secondary | ICD-10-CM | POA: Diagnosis not present

## 2021-01-16 DIAGNOSIS — R531 Weakness: Secondary | ICD-10-CM | POA: Diagnosis not present

## 2021-01-16 DIAGNOSIS — R609 Edema, unspecified: Secondary | ICD-10-CM | POA: Diagnosis not present

## 2021-01-16 DIAGNOSIS — E1142 Type 2 diabetes mellitus with diabetic polyneuropathy: Secondary | ICD-10-CM | POA: Insufficient documentation

## 2021-01-16 MED ORDER — PREDNISONE 50 MG PO TABS
60.0000 mg | ORAL_TABLET | Freq: Once | ORAL | Status: AC
  Administered 2021-01-16: 60 mg via ORAL
  Filled 2021-01-16: qty 1

## 2021-01-16 MED ORDER — KETOROLAC TROMETHAMINE 15 MG/ML IJ SOLN
15.0000 mg | Freq: Once | INTRAMUSCULAR | Status: AC
  Administered 2021-01-16: 15 mg via INTRAMUSCULAR
  Filled 2021-01-16: qty 1

## 2021-01-16 MED ORDER — HYDROMORPHONE HCL 1 MG/ML IJ SOLN
1.0000 mg | Freq: Once | INTRAMUSCULAR | Status: AC
Start: 2021-01-16 — End: 2021-01-16
  Administered 2021-01-16: 1 mg via INTRAMUSCULAR
  Filled 2021-01-16: qty 1

## 2021-01-16 MED ORDER — METHYLPREDNISOLONE 4 MG PO TBPK: ORAL_TABLET | ORAL | 0 refills | Status: DC

## 2021-01-16 MED ORDER — KETOROLAC TROMETHAMINE 30 MG/ML IJ SOLN
30.0000 mg | Freq: Once | INTRAMUSCULAR | Status: AC
  Administered 2021-01-16: 30 mg via INTRAMUSCULAR
  Filled 2021-01-16: qty 1

## 2021-01-16 MED ORDER — HYDROMORPHONE HCL 2 MG PO TABS: 2.0000 mg | ORAL_TABLET | Freq: Once | ORAL | Status: DC

## 2021-01-16 MED ORDER — OXYCODONE HCL 5 MG PO TABS
10.0000 mg | ORAL_TABLET | Freq: Once | ORAL | Status: AC
  Administered 2021-01-16: 10 mg via ORAL
  Filled 2021-01-16: qty 2

## 2021-01-16 NOTE — ED Provider Notes (Signed)
[facility] DEPT Provider Note   CSN: [id number] Arrival date & time: 01/16/21  0220     History Chief Complaint  Patient presents with  . Back Pain  . Leg Pain    Tyler Brewer is a 60 y.o. male.  60 yo M with a chief complaints of chronic low back pain.  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  Has a flareup of his pain every 3 to 4 days and he typically needs to come to the emergency department or the outpatient clinic for acute pain management.  He denies any change to his pain.  Denies any numbness or weakness to his legs denies numbness to his groin denies loss of bowel or bladder.  Denies trauma.  Denies fever.  Denies recent spinal injection.  The history is provided by the patient.  Back Pain Associated symptoms: leg pain   Associated symptoms: no abdominal pain, no chest pain, no fever and no headaches   Leg Pain Associated symptoms: back pain   Associated symptoms: no fever   Illness Severity:  Moderate Onset quality:  Gradual Duration:  1 day Timing:  Constant Progression:  Worsening Chronicity:  New Associated symptoms: no abdominal pain, no chest pain, no congestion, no diarrhea, no fever, no headaches, no myalgias, no rash, no shortness of breath and no vomiting        Past Medical History:  Diagnosis Date  . Arthritis   . CAD (coronary artery disease)  cardiac cath with moderate disease in a septal branch of the ramus intermedius 04/01/2012  . Depression   . Diabetes mellitus    poorly controlled by his report  . History of narcotic addiction ([place])    past history of back pain  . Hypercholesteremia   . Hypertension   . IBS (irritable bowel syndrome)   . 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[place])   . Neuropathy   . Obesity    Max weight was 390  . OSA on CPAP   . Panic attacks   . Testosterone deficiency   . Vertigo     Patient Active Problem List   Diagnosis Date Noted   . CIDP (chronic inflammatory demyelinating polyneuropathy) ([place]) 01/12/2021  . Diabetes mellitus ([place]) 01/03/2021  . Type 2 diabetes mellitus with hyperglycemia, with long-term current use of insulin ([place]) 01/03/2021  . Lower extremity edema 11/28/2020  . Chronic low back pain 11/28/2020  . Psoriatic arthritis ([place]) 11/28/2020  . Thoracic aortic aneurysm without rupture ([place]) 11/28/2020  . Bilateral leg weakness 11/05/2020  . Acute left-sided low back pain with right-sided sciatica 07/19/2020  . Hip pain, acute, left 07/05/2020  . Tremor 07/05/2020  . Weakness 07/05/2020  . Balance problem 03/11/2020  . Tinea cruris 03/11/2020  . Cellulitis of left groin 03/11/2020  . Acute pain of right shoulder 03/11/2020  . Interstitial lung disease ([place]) 05/02/2017  . Pansinusitis 09/26/2016  . Diabetic polyneuropathy associated with diabetes mellitus due to underlying condition ([place]) 05/08/2016  . Methamphetamine use disorder, severe, dependence ([place]) 02/11/2016  . Substance induced mood disorder ([place]) 02/11/2016  . Exertional dyspnea 11/30/2015  . ADD (attention deficit disorder) 09/29/2015  . Binge eating 09/29/2015  . Syncope 05/05/2012  . Diarrhea 05/05/2012  . Panic attacks 05/05/2012  . OSA (obstructive sleep apnea) 05/05/2012  . HTN (hypertension) 05/05/2012  . Dyslipidemia 05/05/2012  . CAD (coronary artery disease)  cardiac cath with moderate disease in a septal branch of the ramus intermedius 04/01/2012  .  Chest pain 04/01/2012  . Drug abuse and dependence ([place]) 04/01/2012  . Family history of coronary artery disease 03/31/2012  . Sleep apnea, on C-pap 03/31/2012  . Hyperlipemia 03/30/2012  . HTN (hypertension), benign 03/30/2012  . Morbid obesity ([place]) 03/30/2012  . DM type 2, uncontrolled, with neuropathy ([place]) 03/30/2012  . Depression with suicidal ideation 03/30/2012    Past Surgical History:  Procedure Laterality Date  . CARDIAC CATHETERIZATION    . IR FLUORO GUIDE  CV LINE RIGHT  11/10/2020  . IR US GUIDE VASC ACCESS RIGHT  11/10/2020  . LEFT HEART CATHETERIZATION WITH CORONARY ANGIOGRAM N/A 03/31/2012   Procedure: LEFT HEART CATHETERIZATION WITH CORONARY ANGIOGRAM;  Surgeon: Leonie Man, MD;  Location: [facility] CATH LAB;  Service: Cardiovascular;  Laterality: N/A;       Family History  Problem Relation Age of Onset  . Diabetes type II Father   . Hypertension Father   . Pancreatic disease Father 75       Deceased  . Healthy Mother   . Healthy Sister   . Healthy Son   . Healthy Daughter   . Parkinson's disease Maternal Grandmother     Social History   Tobacco Use  . Smoking status: Current Every Day Smoker    Packs/day: 0.50    Types: Cigarettes    Last attempt to quit: 09/29/2020    Years since quitting: 0.2  . Smokeless tobacco: Never Used  Vaping Use  . Vaping Use: Never used  Substance Use Topics  . Alcohol use: No    Alcohol/week: 0.0 standard drinks  . Drug use: Not Currently    Types: Methamphetamines    Home Medications Prior to Admission medications   Medication Sig Start Date End Date Taking? Authorizing Provider  acetaminophen (TYLENOL) 500 MG tablet Take 500 mg by mouth every 6 (six) hours as needed. States he is taking one to two tablets a day    [provider]  amLODipine (NORVASC) 10 MG tablet Take 1 tablet (10 mg total) by mouth daily. 01/11/21   [name], DO  diclofenac Sodium (VOLTAREN) 1 % GEL Apply 2 g topically 4 (four) times daily. 08/27/20   Corena Herter, PA-C  DULoxetine (CYMBALTA) 30 MG capsule Take 1 capsule (30 mg total) by mouth daily. Patient taking differently: Take 30 mg by mouth 3 (three) times a week. 07/19/20   [name] R, DO  furosemide (LASIX) 20 MG tablet TAKE 1 TABLET(20 MG) BY MOUTH DAILY 12/21/20   Carollee Herter, Alferd Apa, DO  gabapentin (NEURONTIN) 300 MG capsule Take 2 tablets in the morning, 1 tablet in the afternoon, and 1 tablet at bedtime. Patient taking  differently: Take 300-600 mg by mouth See admin instructions. Take 600 mg in the morning, 300 mg in the afternoon and at bedtime 10/27/20   Narda Amber K, DO  hydrALAZINE (APRESOLINE) 50 MG tablet Take 1 tablet (50 mg total) by mouth every 8 (eight) hours. 11/21/20   Thurnell Lose, MD  ibuprofen (ADVIL) 200 MG tablet Take 200 mg by mouth every 6 (six) hours as needed.    [provider]  insulin aspart (NOVOLOG FLEXPEN) 100 UNIT/ML FlexPen Inject 12 Units into the skin 3 (three) times daily with meals. 01/03/21   [name], Melanie Crazier, MD  insulin degludec (TRESIBA FLEXTOUCH) 200 UNIT/ML FlexTouch Pen Inject 46 Units into the skin daily. 01/03/21   [name], Melanie Crazier, MD  Insulin Pen Needle 31G X 5 MM MISC  1 Device by Does not apply route in the morning, at noon, in the evening, and at bedtime. 01/03/21   [name], Melanie Crazier, MD  losartan (COZAAR) 100 MG tablet Take 1 tablet (100 mg total) by mouth daily. 07/05/20   [name], DO  Menthol, Topical Analgesic, (ICY HOT EX) Apply topically as needed.    [provider]  metFORMIN (GLUCOPHAGE-XR) 500 MG 24 hr tablet Take 2 tablets (1,000 mg total) by mouth in the morning and at bedtime. Patient not taking: No sig reported 01/03/21   [name], Melanie Crazier, MD  methocarbamol (ROBAXIN) 500 MG tablet TAKE ONE TABLET BY MOUTH FOUR TIMES A DAY 01/11/21   Lowne Chase, Alferd Apa, DO  nitrofurantoin, macrocrystal-monohydrate, (MACROBID) 100 MG capsule TAKE 1 CAPSULE BY MOUTH IN THE MORNING AND 1 CAPSULE BY MOUTH IN THE EVENING FOR 5 DAYS 10/21/20   [provider]  pravastatin (PRAVACHOL) 40 MG tablet TAKE ONE TABLET BY MOUTH ONE TIME DAILY 12/26/20   [name], DO  tamsulosin (FLOMAX) 0.4 MG CAPS capsule Take 1 capsule (0.4 mg total) by mouth daily. 03/10/20   [name], DO  tobramycin (TOBREX) 0.3 % ophthalmic ointment Place into the left eye 3 (three) times daily. Patient not  taking: No sig reported 11/21/20   Thurnell Lose, MD  traMADol (ULTRAM) 50 MG tablet TAKE ONE TO TWO TABLETS BY MOUTH EVERY 8 HOURS AS NEEDED FOR PAIN 01/09/21   [name], DO    Allergies    Patient has no known allergies.  Review of Systems   Review of Systems  Constitutional: Negative for chills and fever.  HENT: Negative for congestion and facial swelling.   Eyes: Negative for discharge and visual disturbance.  Respiratory: Negative for shortness of breath.   Cardiovascular: Negative for chest pain and palpitations.  Gastrointestinal: Negative for abdominal pain, diarrhea and vomiting.  Musculoskeletal: Positive for back pain. Negative for arthralgias and myalgias.  Skin: Negative for color change and rash.  Neurological: Negative for tremors, syncope and headaches.  Psychiatric/Behavioral: Negative for confusion and dysphoric mood.    Physical Exam Updated Vital Signs BP (!) 189/102 (BP Location: Right Arm)   Pulse 88   Temp 98.1 F (36.7 C) (Oral)   Resp 18   Ht 5\' 11"  (1.803 m)   Wt 127 kg   SpO2 99%   BMI 39.05 kg/m   Physical Exam Vitals and nursing note reviewed.  Constitutional:      Appearance: He is well-developed.  HENT:     Head: Normocephalic and atraumatic.  Eyes:     Pupils: Pupils are equal, round, and reactive to light.  Neck:     Vascular: No JVD.  Cardiovascular:     Rate and Rhythm: Normal rate and regular rhythm.     Heart sounds: No murmur heard. No friction rub. No gallop.   Pulmonary:     Effort: No respiratory distress.     Breath sounds: No wheezing.  Abdominal:     General: There is no distension.     Tenderness: There is no guarding or rebound.  Musculoskeletal:        General: Normal range of motion.     Cervical back: Normal range of motion and neck supple.     Left lower leg: Edema present.     Comments: Unilateral swelling to the left lower extremity 2+ pitting edema up to the knee.  Chronic finding for the  patient.  Pulse and sensation intact to bilateral lower extremities.  Reflexes +1 bilaterally.  Ambulatory  Skin:    Coloration: Skin is not pale.     Findings: No rash.  Neurological:     Mental Status: He is alert and oriented to person, place, and time.  Psychiatric:        Behavior: Behavior normal.     ED Results / Procedures / Treatments   Labs (all labs ordered are listed, but only abnormal results are displayed) Labs Reviewed - No data to display  EKG None  Radiology No results found.  Procedures Procedures   Medications Ordered in ED Medications  HYDROmorphone (DILAUDID) injection 1 mg (has no administration in time range)  ketorolac (TORADOL) 15 MG/ML injection 15 mg (has no administration in time range)    ED Course  I have reviewed the triage vital signs and the nursing notes.  Pertinent labs & imaging results that were available during my care of the patient were reviewed by me and considered in my medical decision making (see chart for details).    MDM Rules/Calculators/A&P                          60 yo M with a chief complaints of low back pain.  This is a chronic problem for him.  He has had 13 visits in the past 6 months all which included a complaints of back pain.  He is hopeful that he is going to follow-up with his neurologist on Thursday and he is planning to get into pain management.  Tells me that typically his symptoms get significantly better with the dose of Toradol and Dilaudid.  We will give him a dose here.  Neurology follow-up.  5:09 AM:  I have discussed the diagnosis/risks/treatment options with the patient and believe the pt to be eligible for discharge home to follow-up with Neuro, pain managment. We also discussed returning to the ED immediately if new or worsening sx occur. We discussed the sx which are most concerning (e.g., sudden worsening pain, fever, inability to tolerate by mouth, cauda equina s/sx) that necessitate immediate  return. Medications administered to the patient during their visit and any new prescriptions provided to the patient are listed below.  Medications given during this visit Medications  HYDROmorphone (DILAUDID) injection 1 mg (has no administration in time range)  ketorolac (TORADOL) 15 MG/ML injection 15 mg (has no administration in time range)     The patient appears reasonably screen and/or stabilized for discharge and I doubt any other medical condition or other [facility] requiring further screening, evaluation, or treatment in the ED at this time prior to discharge.   Final Clinical Impression(s) / ED Diagnoses Final diagnoses:  Chronic right-sided low back pain with bilateral sciatica    Rx / DC Orders ED Discharge Orders    None       Deno Etienne, DO 01/16/21 2951

## 2021-01-16 NOTE — ED Triage Notes (Signed)
To ER via EMS c.o chronic back pain and pain in his legs. He was given Toradol at [facility] this am.

## 2021-01-16 NOTE — Discharge Instructions (Signed)
Please follow-up with your family doctor and your back doctor.  Please return to the emergency department for new numbness or weakness to your leg difficulty urinating or if you feel numbness to your groin or develop a fever.

## 2021-01-16 NOTE — ED Notes (Signed)
Took 20mg  lasix for the swelling BLE, Denies SOB. 10/10 Back radiates BLE.

## 2021-01-16 NOTE — ED Notes (Signed)
Pt discharged from this ED in stable condition at this time. All discharge instructions and follow up care reviewed with pt with no further questions at this time

## 2021-01-16 NOTE — Discharge Instructions (Addendum)
Chronic Pain Discharge Instructions   Please be aware of our hospital's policy regarding opioids, narcotics, and controlled substances.  Emergency care providers appreciate that many patients coming to [place] are in severe pain, and we wish to address their pain in the safest, most responsible manner.  It is important to recognize however, that the proper treatment of chronic pain differs from that of the pain of injuries and acute illnesses.  Our goal is to provide quality, safe, personalized care and we thank you for giving [place] the opportunity to serve you.  If you have a chronic pain syndrome (i.e. chronic headaches, recurrent back or neck pain, dental pain, abdominal or pelvis pain without a specific diagnosis, or neuropathic pain such as fibromyalgia) or recurrent visits for the same condition without an acute diagnosis, you may be treated with non-narcotics and other non-addictive medicines.  Patients managing chronic pain should have provisions in place with their primary care doctor for breakthrough pain. It is every patient's personal responsibility to maintain active prescriptions with his or her primary care physician or specialist. If you are in crisis, you should call your primary physician first.  If your physician directs you to the emergency department, please have the doctor call and speak to our attending physician concerning your care.  The use of narcotics and related agents for chronic pain syndromes may lead to many physical and psychological problems.  Nearly as many people die from prescription narcotics each year as die from car crashes.  Additionally, this risk is known to increase if such prescriptions are obtained from a variety of sources.  Therefore, your name may be checked first through the [place].  This database is a record of controlled substance medication prescriptions that the patient has received.  This has been established by  [facility] in an effort to eliminate the dangerous, and often life threatening, practice of obtaining multiple prescriptions from different medical providers. Only your primary care physician or a pain management specialist is able to safely treat such syndromes with narcotic medications long-term.    In those rare situations where the Emergency Department physician feels narcotic medications are appropriate, the physician will prescribe these in very limited quantities.  The amount of these medications will last only until you can see your primary care physician in his/her office.  Any patient who returns to the ED seeking refills should expect only non-narcotic pain medications.  Prescriptions for narcotic or sedating medications that have been lost, stolen or expired will not be refilled in the Emergency Department.    Finally, in the event of an acute medical condition exists and the emergency physician feels it is necessary that the patient be given a narcotic or sedating medication -  a responsible adult driver should available to provide the patient with safe transportation home.

## 2021-01-16 NOTE — ED Provider Notes (Signed)
[facility] EMERGENCY DEPARTMENT Provider Note   CSN: [id number] Arrival date & time: 01/16/21  1433     History Chief Complaint  Patient presents with  . Leg Pain    Tyler Brewer is a 60 y.o. male who reports a hx of CIPD, obesity, and chronic back pain presenting to ED with recurring back pain and leg swelling.  He takes tramadol, flexeril, gabapentin, and motrin for his chronic pain at home, and lasix 20 mg daily for his leg swelling.  He reports he has an upcoming appointment with his neurologist on Thursday (in 3 days) but came to the ED due to significant break through pain.  Today is memorial day and he is not able to reach his normal providers. He states this is in his back, and both bilateral legs - pain is maximum intensity.  He was seen at [facility] ED last night for the same pain, received toradol and dilaudid and states he was able to fall asleep at home, but he continues to be in significant pain.  He wonders if he would benefit from steroids as well.  He is diabetic on insulin but reports BS ranging around 160 on average at home, taking long-acting and short acting insulin regularly.    HPI     Past Medical History:  Diagnosis Date  . Arthritis   . CAD (coronary artery disease)  cardiac cath with moderate disease in a septal branch of the ramus intermedius 04/01/2012  . Depression   . Diabetes mellitus    poorly controlled by his report  . History of narcotic addiction ([place])    past history of back pain  . Hypercholesteremia   . Hypertension   . IBS (irritable bowel syndrome)   . 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[place])   . Neuropathy   . Obesity    Max weight was 390  . OSA on CPAP   . Panic attacks   . Testosterone deficiency   . Vertigo     Patient Active Problem List   Diagnosis Date Noted  . CIDP (chronic inflammatory demyelinating polyneuropathy) ([place]) 01/12/2021  . Diabetes mellitus ([place]) 01/03/2021  . Type 2 diabetes mellitus with  hyperglycemia, with long-term current use of insulin ([place]) 01/03/2021  . Lower extremity edema 11/28/2020  . Chronic low back pain 11/28/2020  . Psoriatic arthritis ([place]) 11/28/2020  . Thoracic aortic aneurysm without rupture ([place]) 11/28/2020  . Bilateral leg weakness 11/05/2020  . Acute left-sided low back pain with right-sided sciatica 07/19/2020  . Hip pain, acute, left 07/05/2020  . Tremor 07/05/2020  . Weakness 07/05/2020  . Balance problem 03/11/2020  . Tinea cruris 03/11/2020  . Cellulitis of left groin 03/11/2020  . Acute pain of right shoulder 03/11/2020  . Interstitial lung disease ([place]) 05/02/2017  . Pansinusitis 09/26/2016  . Diabetic polyneuropathy associated with diabetes mellitus due to underlying condition ([place]) 05/08/2016  . Methamphetamine use disorder, severe, dependence ([place]) 02/11/2016  . Substance induced mood disorder ([place]) 02/11/2016  . Exertional dyspnea 11/30/2015  . ADD (attention deficit disorder) 09/29/2015  . Binge eating 09/29/2015  . Syncope 05/05/2012  . Diarrhea 05/05/2012  . Panic attacks 05/05/2012  . OSA (obstructive sleep apnea) 05/05/2012  . HTN (hypertension) 05/05/2012  . Dyslipidemia 05/05/2012  . CAD (coronary artery disease)  cardiac cath with moderate disease in a septal branch of the ramus intermedius 04/01/2012  . Chest pain 04/01/2012  . Drug abuse and dependence ([place]) 04/01/2012  . Family history of coronary artery disease 03/31/2012  .  Sleep apnea, on C-pap 03/31/2012  . Hyperlipemia 03/30/2012  . HTN (hypertension), benign 03/30/2012  . Morbid obesity ([place]) 03/30/2012  . DM type 2, uncontrolled, with neuropathy ([place]) 03/30/2012  . Depression with suicidal ideation 03/30/2012    Past Surgical History:  Procedure Laterality Date  . CARDIAC CATHETERIZATION    . IR FLUORO GUIDE CV LINE RIGHT  11/10/2020  . IR US GUIDE VASC ACCESS RIGHT  11/10/2020  . LEFT HEART CATHETERIZATION WITH CORONARY ANGIOGRAM N/A 03/31/2012   Procedure:  LEFT HEART CATHETERIZATION WITH CORONARY ANGIOGRAM;  Surgeon: Leonie Man, MD;  Location[facility] C[facility] LAB;  Service: Cardiovascular;  Laterality: N/A;       Family History  Problem Relation Age of Onset  . Diabetes type II Father   . Hypertension Father   . Pancreatic disease Father 7       Deceased  . Healthy Mother   . Healthy Sister   . Healthy Son   . Healthy Daughter   . Parkinson's disease Maternal Grandmother     Social History   Tobacco Use  . Smoking status: Current Every Day Smoker    Packs/day: 0.50    Types: Cigarettes    Last attempt to quit: 09/29/2020    Years since quitting: 0.3  . Smokeless tobacco: Never Used  Vaping Use  . Vaping Use: Never used  Substance Use Topics  . Alcohol use: No    Alcohol/week: 0.0 standard drinks  . Drug use: Not Currently    Types: Methamphetamines    Home Medications Prior to Admission medications   Medication Sig Start Date End Date Taking? Authorizing Provider  methylPREDNISolone (MEDROL DOSEPAK) 4 MG TBPK tablet Use as directed 01/17/21  Yes Glenard Keesling, Carola Rhine, MD  acetaminophen (TYLENOL) 500 MG tablet Take 500 mg by mouth every 6 (six) hours as needed. States he is taking one to two tablets a day    [provider]  amLODipine (NORVASC) 10 MG tablet Take 1 tablet (10 mg total) by mouth daily. 01/11/21   [name], DO  diclofenac Sodium (VOLTAREN) 1 % GEL Apply 2 g topically 4 (four) times daily. 08/27/20   Corena Herter, PA-C  DULoxetine (CYMBALTA) 30 MG capsule Take 1 capsule (30 mg total) by mouth daily. Patient taking differently: Take 30 mg by mouth 3 (three) times a week. 07/19/20   [name] R, DO  furosemide (LASIX) 20 MG tablet TAKE 1 TABLET(20 MG) BY MOUTH DAILY 12/21/20   Carollee Herter, Alferd Apa, DO  gabapentin (NEURONTIN) 300 MG capsule Take 2 tablets in the morning, 1 tablet in the afternoon, and 1 tablet at bedtime. Patient taking differently: Take 300-600 mg by mouth See admin  instructions. Take 600 mg in the morning, 300 mg in the afternoon and at bedtime 10/27/20   Narda Amber K, DO  hydrALAZINE (APRESOLINE) 50 MG tablet Take 1 tablet (50 mg total) by mouth every 8 (eight) hours. 11/21/20   Thurnell Lose, MD  ibuprofen (ADVIL) 200 MG tablet Take 200 mg by mouth every 6 (six) hours as needed.    [provider]  insulin aspart (NOVOLOG FLEXPEN) 100 UNIT/ML FlexPen Inject 12 Units into the skin 3 (three) times daily with meals. 01/03/21   [name], Melanie Crazier, MD  insulin degludec (TRESIBA FLEXTOUCH) 200 UNIT/ML FlexTouch Pen Inject 46 Units into the skin daily. 01/03/21   [name], Melanie Crazier, MD  Insulin Pen Needle 31G X 5 MM MISC 1 Device by Does  not apply route in the morning, at noon, in the evening, and at bedtime. 01/03/21   [name], Melanie Crazier, MD  losartan (COZAAR) 100 MG tablet Take 1 tablet (100 mg total) by mouth daily. 07/05/20   [name], DO  Menthol, Topical Analgesic, (ICY HOT EX) Apply topically as needed.    [provider]  metFORMIN (GLUCOPHAGE-XR) 500 MG 24 hr tablet Take 2 tablets (1,000 mg total) by mouth in the morning and at bedtime. Patient not taking: No sig reported 01/03/21   [name], Melanie Crazier, MD  methocarbamol (ROBAXIN) 500 MG tablet TAKE ONE TABLET BY MOUTH FOUR TIMES A DAY 01/11/21   Lowne Chase, Alferd Apa, DO  nitrofurantoin, macrocrystal-monohydrate, (MACROBID) 100 MG capsule TAKE 1 CAPSULE BY MOUTH IN THE MORNING AND 1 CAPSULE BY MOUTH IN THE EVENING FOR 5 DAYS 10/21/20   [provider]  pravastatin (PRAVACHOL) 40 MG tablet TAKE ONE TABLET BY MOUTH ONE TIME DAILY 12/26/20   [name], DO  tamsulosin (FLOMAX) 0.4 MG CAPS capsule Take 1 capsule (0.4 mg total) by mouth daily. 03/10/20   [name], DO  tobramycin (TOBREX) 0.3 % ophthalmic ointment Place into the left eye 3 (three) times daily. Patient not taking: No sig reported 11/21/20   Thurnell Lose, MD  traMADol (ULTRAM) 50 MG tablet TAKE ONE TO TWO TABLETS BY MOUTH EVERY 8 HOURS AS NEEDED FOR PAIN 01/09/21   [name], DO    Allergies    Patient has no known allergies.  Review of Systems   Review of Systems  Constitutional: Negative for chills and fever.  Cardiovascular: Positive for leg swelling.  Musculoskeletal: Positive for arthralgias and back pain.  Neurological: Positive for weakness.    Physical Exam Updated Vital Signs BP 138/79 (BP Location: Left Arm)   Pulse 86   Temp 98.5 F (36.9 C) (Oral)   Resp 18   Ht 5\' 11"  (1.803 m)   Wt 127 kg   SpO2 99%   BMI 39.05 kg/m   Physical Exam Constitutional:      General: He is not in acute distress. HENT:     Head: Normocephalic and atraumatic.  Eyes:     Conjunctiva/sclera: Conjunctivae normal.     Pupils: Pupils are equal, round, and reactive to light.  Cardiovascular:     Rate and Rhythm: Normal rate and regular rhythm.     Pulses: Normal pulses.  Musculoskeletal:     Comments: Symmetrical bilateral pitting edema of lower extremities  Skin:    General: Skin is warm and dry.  Neurological:     General: No focal deficit present.     Mental Status: He is alert. Mental status is at baseline.     Comments: Patient ambulating into ED with assistance of cane Poor effort on strength testing  Psychiatric:        Mood and Affect: Mood normal.        Behavior: Behavior normal.     ED Results / Procedures / Treatments   Labs (all labs ordered are listed, but only abnormal results are displayed) Labs Reviewed - No data to display  EKG None  Radiology No results found.  Procedures Procedures   Medications Ordered in ED Medications  ketorolac (TORADOL) 30 MG/ML injection 30 mg (30 mg Intramuscular Given 01/16/21 1546)  predniSONE (DELTASONE) tablet 60 mg (60 mg Oral Given 01/16/21 1545)  oxyCODONE (Oxy IR/ROXICODONE) immediate release tablet 10 mg (10 mg  Oral Given 01/16/21 1554)    ED  Course  I have reviewed the triage vital signs and the nursing notes.  Pertinent labs & imaging results that were available during my care of the patient were reviewed by me and considered in my medical decision making (see chart for details).  60 year old male presenting with chronic back pain - reports this is related to his CIPD for which he will see a new neurologist on Thursday.    Per his report this is a flare up of his chronic pain.  I have a lower suspicion for acute spinal fracture or cauda equina syndrome at this time.  Medical records reviewed.  MRI of entire spine from March 2022 showing no significant central canal impingement, some edema of erector spinae muscles.  L2-L3 disc protrusion noted.  We discussed expectations for pain management in the ED.  We will not refill pain medications or provide narcotic prescriptions for chronic pain syndrome.  I will provide him a dose of a stronger pain medication here and start him on prednisone - low dose medrol pack for several days - as he believes this has helped his symptoms.   I explained the steroids can began to take effect after 12-24 hours, and hopefully he will have enough pain relief to manage until then.  We discussed glucose monitoring and titration of insulin at home while on prednisone.      Final Clinical Impression(s) / ED Diagnoses Final diagnoses:  Chronic midline back pain, unspecified back location    Rx / DC Orders ED Discharge Orders         Ordered    methylPREDNISolone (MEDROL DOSEPAK) 4 MG TBPK tablet        01/16/21 1528           Wyvonnia Dusky, MD 01/17/21 [phone]

## 2021-01-16 NOTE — ED Triage Notes (Signed)
Patient arrives via EMS with complaints of acute on chronic back and leg pain. Patient is ambulatory per EMS, patient walked to the truck. Patient able to stand with assistance of his cane and without staff.

## 2021-01-17 ENCOUNTER — Encounter: Payer: Self-pay | Admitting: Family Medicine

## 2021-01-17 DIAGNOSIS — G8929 Other chronic pain: Secondary | ICD-10-CM | POA: Diagnosis not present

## 2021-01-17 DIAGNOSIS — G4733 Obstructive sleep apnea (adult) (pediatric): Secondary | ICD-10-CM | POA: Diagnosis not present

## 2021-01-17 DIAGNOSIS — J849 Interstitial pulmonary disease, unspecified: Secondary | ICD-10-CM | POA: Diagnosis not present

## 2021-01-17 DIAGNOSIS — E1136 Type 2 diabetes mellitus with diabetic cataract: Secondary | ICD-10-CM | POA: Diagnosis not present

## 2021-01-17 DIAGNOSIS — E1142 Type 2 diabetes mellitus with diabetic polyneuropathy: Secondary | ICD-10-CM | POA: Diagnosis not present

## 2021-01-17 DIAGNOSIS — E1121 Type 2 diabetes mellitus with diabetic nephropathy: Secondary | ICD-10-CM | POA: Diagnosis not present

## 2021-01-17 DIAGNOSIS — E7800 Pure hypercholesterolemia, unspecified: Secondary | ICD-10-CM | POA: Diagnosis not present

## 2021-01-17 DIAGNOSIS — I2510 Atherosclerotic heart disease of native coronary artery without angina pectoris: Secondary | ICD-10-CM | POA: Diagnosis not present

## 2021-01-17 DIAGNOSIS — I10 Essential (primary) hypertension: Secondary | ICD-10-CM | POA: Diagnosis not present

## 2021-01-17 DIAGNOSIS — M5441 Lumbago with sciatica, right side: Secondary | ICD-10-CM | POA: Diagnosis not present

## 2021-01-19 ENCOUNTER — Ambulatory Visit: Payer: Medicare HMO | Admitting: Neurology

## 2021-01-19 ENCOUNTER — Encounter: Payer: Self-pay | Admitting: Family Medicine

## 2021-01-19 ENCOUNTER — Ambulatory Visit (INDEPENDENT_AMBULATORY_CARE_PROVIDER_SITE_OTHER): Payer: Medicare HMO | Admitting: Neurology

## 2021-01-19 ENCOUNTER — Other Ambulatory Visit: Payer: Self-pay

## 2021-01-19 ENCOUNTER — Ambulatory Visit (INDEPENDENT_AMBULATORY_CARE_PROVIDER_SITE_OTHER): Payer: Medicare HMO | Admitting: *Deleted

## 2021-01-19 VITALS — BP 107/66 | HR 68 | Ht 71.0 in | Wt 278.0 lb

## 2021-01-19 DIAGNOSIS — E1142 Type 2 diabetes mellitus with diabetic polyneuropathy: Secondary | ICD-10-CM | POA: Diagnosis not present

## 2021-01-19 DIAGNOSIS — R29898 Other symptoms and signs involving the musculoskeletal system: Secondary | ICD-10-CM

## 2021-01-19 DIAGNOSIS — F1994 Other psychoactive substance use, unspecified with psychoactive substance-induced mood disorder: Secondary | ICD-10-CM

## 2021-01-19 DIAGNOSIS — I10 Essential (primary) hypertension: Secondary | ICD-10-CM

## 2021-01-19 DIAGNOSIS — R2689 Other abnormalities of gait and mobility: Secondary | ICD-10-CM

## 2021-01-19 DIAGNOSIS — F32A Depression, unspecified: Secondary | ICD-10-CM

## 2021-01-19 DIAGNOSIS — Z794 Long term (current) use of insulin: Secondary | ICD-10-CM

## 2021-01-19 DIAGNOSIS — E1165 Type 2 diabetes mellitus with hyperglycemia: Secondary | ICD-10-CM

## 2021-01-19 DIAGNOSIS — R251 Tremor, unspecified: Secondary | ICD-10-CM

## 2021-01-19 DIAGNOSIS — F410 Panic disorder [episodic paroxysmal anxiety] without agoraphobia: Secondary | ICD-10-CM

## 2021-01-19 DIAGNOSIS — R45851 Suicidal ideations: Secondary | ICD-10-CM

## 2021-01-19 DIAGNOSIS — E785 Hyperlipidemia, unspecified: Secondary | ICD-10-CM

## 2021-01-19 NOTE — Patient Instructions (Addendum)
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    Keep working controlling your blood sugars  Continue home physical therapy exercises  Return to clinic 6 months

## 2021-01-19 NOTE — Chronic Care Management (AMB) (Signed)
Chronic Care Management    Clinical Social Work Note  01/19/2021 Name: Tyler Brewer MRN: [id number] DOB: 01-Apr-1961  Tyler Brewer is a 60 y.o. year old male who is a primary care patient of [name], DO. The CCM team was consulted to assist the patient with chronic disease management and/or care coordination needs related to: Patient with History of Falls/Fall Risk, Unsteady Gait, Impaired Mobility, Weakness, Tremor, Syncope, Balance Problem and Lower Extremity Edema needs assistance with initiating short-term rehabilitative services in a skilled nursing facility.   Engaged with patient by telephone for follow up visit in response to provider referral for social work chronic care management and care coordination services.   Consent to Services:  The patient was given information about Chronic Care Management services, agreed to services, and gave verbal consent prior to initiation of services.  Please see initial visit note for detailed documentation.   Patient agreed to services and consent obtained.   Assessment: Review of patient past medical history, allergies, medications, and health status, including review of relevant consultants reports was performed today as part of a comprehensive evaluation and provision of chronic care management and care coordination services.     SDOH (Social Determinants of Health) assessments and interventions performed:    Advanced Directives Status: Not addressed in this encounter.  CCM Care Plan  No Known Allergies  Outpatient Encounter Medications as of 01/19/2021  Medication Sig Note  . acetaminophen (TYLENOL) 500 MG tablet Take 500 mg by mouth every 6 (six) hours as needed. States he is taking one to two tablets a day   . amLODipine (NORVASC) 10 MG tablet Take 1 tablet (10 mg total) by mouth daily.   . diclofenac Sodium (VOLTAREN) 1 % GEL Apply 2 g topically 4 (four) times daily.   . DULoxetine (CYMBALTA) 30 MG capsule Take 1  capsule (30 mg total) by mouth daily. (Patient taking differently: Take 30 mg by mouth 3 (three) times a week.)   . furosemide (LASIX) 20 MG tablet TAKE 1 TABLET(20 MG) BY MOUTH DAILY   . gabapentin (NEURONTIN) 300 MG capsule Take 2 tablets in the morning, 1 tablet in the afternoon, and 1 tablet at bedtime. (Patient taking differently: Take 300-600 mg by mouth See admin instructions. Take 600 mg in the morning, 300 mg in the afternoon and at bedtime)   . hydrALAZINE (APRESOLINE) 50 MG tablet Take 1 tablet (50 mg total) by mouth every 8 (eight) hours.   [name] Kitchen ibuprofen (ADVIL) 200 MG tablet Take 200 mg by mouth every 6 (six) hours as needed.   . insulin aspart (NOVOLOG FLEXPEN) 100 UNIT/ML FlexPen Inject 12 Units into the skin 3 (three) times daily with meals.   . insulin degludec (TRESIBA FLEXTOUCH) 200 UNIT/ML FlexTouch Pen Inject 46 Units into the skin daily.   . Insulin Pen Needle 31G X 5 MM MISC 1 Device by Does not apply route in the morning, at noon, in the evening, and at bedtime.   [name] Kitchen losartan (COZAAR) 100 MG tablet Take 1 tablet (100 mg total) by mouth daily.   . Menthol, Topical Analgesic, (ICY HOT EX) Apply topically as needed.   . metFORMIN (GLUCOPHAGE-XR) 500 MG 24 hr tablet Take 2 tablets (1,000 mg total) by mouth in the morning and at bedtime. (Patient not taking: No sig reported) 01/06/2021: Hasn't picked up from pharmacy yet  . methocarbamol (ROBAXIN) 500 MG tablet TAKE ONE TABLET BY MOUTH FOUR TIMES A DAY   . methylPREDNISolone (MEDROL DOSEPAK) 4  MG TBPK tablet Use as directed   . nitrofurantoin, macrocrystal-monohydrate, (MACROBID) 100 MG capsule TAKE 1 CAPSULE BY MOUTH IN THE MORNING AND 1 CAPSULE BY MOUTH IN THE EVENING FOR 5 DAYS   . pravastatin (PRAVACHOL) 40 MG tablet TAKE ONE TABLET BY MOUTH ONE TIME DAILY   . tamsulosin (FLOMAX) 0.4 MG CAPS capsule Take 1 capsule (0.4 mg total) by mouth daily.   [name] Kitchen tobramycin (TOBREX) 0.3 % ophthalmic ointment Place into the left eye 3 (three)  times daily. (Patient not taking: No sig reported)   . traMADol (ULTRAM) 50 MG tablet TAKE ONE TO TWO TABLETS BY MOUTH EVERY 8 HOURS AS NEEDED FOR PAIN    No facility-administered encounter medications on file as of 01/19/2021.    Patient Active Problem List   Diagnosis Date Noted  . CIDP (chronic inflammatory demyelinating polyneuropathy) ([place]) 01/12/2021  . Diabetes mellitus ([place]) 01/03/2021  . Type 2 diabetes mellitus with hyperglycemia, with long-term current use of insulin ([place]) 01/03/2021  . Lower extremity edema 11/28/2020  . Chronic low back pain 11/28/2020  . Psoriatic arthritis ([place]) 11/28/2020  . Thoracic aortic aneurysm without rupture ([place]) 11/28/2020  . Bilateral leg weakness 11/05/2020  . Acute left-sided low back pain with right-sided sciatica 07/19/2020  . Hip pain, acute, left 07/05/2020  . Tremor 07/05/2020  . Weakness 07/05/2020  . Balance problem 03/11/2020  . Tinea cruris 03/11/2020  . Cellulitis of left groin 03/11/2020  . Acute pain of right shoulder 03/11/2020  . Interstitial lung disease ([place]) 05/02/2017  . Pansinusitis 09/26/2016  . Diabetic polyneuropathy associated with diabetes mellitus due to underlying condition ([place]) 05/08/2016  . Methamphetamine use disorder, severe, dependence ([place]) 02/11/2016  . Substance induced mood disorder ([place]) 02/11/2016  . Exertional dyspnea 11/30/2015  . ADD (attention deficit disorder) 09/29/2015  . Binge eating 09/29/2015  . Syncope 05/05/2012  . Diarrhea 05/05/2012  . Panic attacks 05/05/2012  . OSA (obstructive sleep apnea) 05/05/2012  . HTN (hypertension) 05/05/2012  . Dyslipidemia 05/05/2012  . CAD (coronary artery disease)  cardiac cath with moderate disease in a septal branch of the ramus intermedius 04/01/2012  . Chest pain 04/01/2012  . Drug abuse and dependence ([place]) 04/01/2012  . Family history of coronary artery disease 03/31/2012  . Sleep apnea, on C-pap 03/31/2012  . Hyperlipemia 03/30/2012  . HTN  (hypertension), benign 03/30/2012  . Morbid obesity ([place]) 03/30/2012  . DM type 2, uncontrolled, with neuropathy ([place]) 03/30/2012  . Depression with suicidal ideation 03/30/2012    Conditions to be addressed/monitored: Patient with History of Falls/Fall Risk, Unsteady Gait, Impaired Mobility, Weakness, Tremor, Syncope, Balance Problem and Lower Extremity Edema needs assistance with initiating short-term rehabilitative services in a skilled nursing facility. Limited Social Support, Housing Barriers, Level of Care Concerns, ADL/IADL Limitations, Mental Health Concerns, Substance Abuse Issues, Social Isolation and Limited Access to Caregiver.  Care Plan : LCSW Plan of Care  Updates made by Francis Gaines, LCSW since 01/19/2021 12:00 AM    Problem: Receive Short-Term Rehabilitative Services in a [facility].   Priority: High    Goal: Receive Short-Term Rehabilitative Services in a [place].   Start Date: 01/04/2021  Expected End Date: 01/27/2021  This Visit's Progress: On track  Recent Progress: On track  Priority: High  Note:   Current Barriers:   . Patient with History of Falls/Fall Risk, Unsteady Gait, Impaired Mobility, Weakness, Tremor, Syncope, Balance Problem and Lower Extremity Edema needs assistance with initiating short-term rehabilitative services in a  skilled nursing facility. . Patient's health has declined and patient is no longer able to complete activities of daily living independently. . Patient acknowledges deficits with education and support in order to meet this need. . Patient does not attend all scheduled provider appointments. . Patient does not adhere to prescribed medication regimen. . Patient lacks social connections. . Patient is experiencing financial constraints related to inability to pay for rent/housing. . Patient has limited social support. . Patient has level of care concerns. . Patient has mental health concerns. . Patient  is experiencing family and relationship dysfunction. . Patient has limited access to caregiver. . Patient is experiencing memory deficits. Clinical Goal(s):  [name] Kitchen Over the next 30 to 45 days, patient will be placed in a skilled nursing facility to receive short-term rehabilitative services. . Patient will work with LCSW, as well as Home Health Social Worker, Chiropodist with [facility], to coordinate needs for short-term skilled nursing placement for rehabilitative services. Interventions: . Collaboration with Primary Care Physician, Dr. [name] regarding development and update of comprehensive plan of care as evidenced by provider attestation and co-signature. Bertram Savin care team collaboration (see longitudinal plan of care). . Assessed needs and provided education on level of care and facility placement process. . Completed FL-2 Form and faxed to Primary Care Physician, Dr. [name] for review and signature. . Obtained patient's verbal consent to fax FL-2 Form to facilities of interest. . Obtained patient's verbal consent to initiate a PASSAR number. . Reviewed and initiated process to obtain PASSAR number. [name] Kitchen FL-2 Form faxed to [facility] and [facility], per patient's request. . Collaboration with identified facilities of interest [facility]) to confirm receipt of completed and signed FL-2 Form. . Collaboration with Mora Appl, Home Health Physical Therapist with [facility] - # [phone], to request home health physical therapy notes and recommendations. . Collaboration with Costella Hatcher, [place] Occupational Therapist with [facility] - # [phone], to request home health occupational therapy notes and recommendations. [name] Kitchen Collaboration with Lattie Haw, [facility] Social Worker with [facility] - # [phone], to request assistance with skilled nursing placement  for patient. . Collaboration with Dr. Madalyn Rob, Attending Physician at [facility]) regarding request for rapid COVID-19 Screening. . Collaboration with representative from [employer] to try and obtain approval/prior authorization for short-term skilled nursing placement. . Faxed home health physical and occupational therapy notes and recommendations, along with completed and signed FL-2 Form to [facility] to try and obtain approval/prior authorization for short-term skilled nursing placement. . Patient interviewed and appropriate assessments performed. . Discussed plans with patient for ongoing care management follow up and provided patient with direct contact information for care management team. . Advised patient to answer all calls from LCSW, confirming contact information for LCSW. Patient Goals/Self-Care Activities: . Continue to work with home health nursing, physical therapist, occupational therapist and social worker with [facility]. . Answer all calls from LCSW [phone]). . LCSW received a denial from [facility] with regards to approving you for short-term rehabilitative services in a skilled nursing facility.   . Discussed placement options with patient and encouraged him to consider independent living, assisted living, residential care and/or group home placement. . Patient will contact LCSW after his 10:50am appointment today (Thursday, January 19, 2021) with Neurologist, Dr. Narda Amber at [facility], to report findings and  notify LCSW of his decision regarding placement.   Follow-Up Plan: Telephone follow-up appointment with care management team member scheduled for:  01/27/2021 at 9:00am.      Follow Up Plan: 01/27/2021 at [place] LCSW Licensed Clinical Social Worker [place] [phone]

## 2021-01-19 NOTE — Progress Notes (Addendum)
Follow-up Visit   Date: 01/19/21   Tyler Brewer MRN: [id number] DOB: [birth year]   Interim History: Tyler Brewer is a 60 y.o. 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 The patient was accompanied to the clinic by self.  Patient was late or missed his prior follow-up visits, so this is the first time that I am able to evaluate him since March.  In March, he underwent CSF testing which showed elevated protein and glucose with normal cell count.  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  He is making conscious effort to keep his diabetes under better control and has stopped eating sweets and better about taking his medication.  His HbA1c is 9.9.   He is currently living at home, getting home PT.  He has a left AFO, but unable to wear it due to leg swelling. He feels that his strength has improved some, but his fatigue and generalized pain in the back, hips, knees, and legs is still ongoing.  He has multiple ER visits for chronic pain in various body parts (back, abdomen, legs, groin).   He will be seeing pain management next month. He has some numbness/tingling in the feet, worse on the left. He is able to stand and uses a cane to walk at home, and prefers a wheelchair for long distances.  He is trying to get placement at SNF, and struggling to find a facility to accept him.      Medications:  Current Outpatient Medications on File Prior to Visit  Medication Sig Dispense Refill  . acetaminophen (TYLENOL) 500 MG tablet Take 500 mg by mouth every 6 (six) hours as needed. States he is taking one to two tablets a day    . amLODipine (NORVASC) 10 MG tablet Take 1 tablet (10 mg total) by mouth daily. 90 tablet 1  . diclofenac Sodium (VOLTAREN) 1 % GEL Apply 2 g topically 4 (four) times daily. 50 g 0  . DULoxetine (CYMBALTA) 30 MG capsule Take 1 capsule (30 mg total) by mouth daily.  (Patient taking differently: Take 30 mg by mouth 3 (three) times a week.) 60 capsule 3  . furosemide (LASIX) 20 MG tablet TAKE 1 TABLET(20 MG) BY MOUTH DAILY 90 tablet 0  . gabapentin (NEURONTIN) 300 MG capsule Take 2 tablets in the morning, 1 tablet in the afternoon, and 1 tablet at bedtime. (Patient taking differently: Take 300-600 mg by mouth See admin instructions. Take 600 mg in the morning, 300 mg in the afternoon and at bedtime) 120 capsule 5  . hydrALAZINE (APRESOLINE) 50 MG tablet Take 1 tablet (50 mg total) by mouth every 8 (eight) hours. 90 tablet 0  . ibuprofen (ADVIL) 200 MG tablet Take 200 mg by mouth every 6 (six) hours as needed.    . insulin aspart (NOVOLOG FLEXPEN) 100 UNIT/ML FlexPen Inject 12 Units into the skin 3 (three) times daily with meals. 30 mL 2  . insulin degludec (TRESIBA FLEXTOUCH) 200 UNIT/ML FlexTouch Pen Inject 46 Units into the skin daily. 30 mL 2  . Insulin Pen Needle 31G X 5 MM MISC 1 Device by Does not apply route in the morning, at noon, in the evening, and at bedtime. 400 each 2  . losartan (COZAAR) 100 MG tablet Take 1 tablet (100 mg total) by mouth daily. 90 tablet 3  . Menthol, Topical Analgesic, (ICY HOT EX) Apply topically as needed.    . metFORMIN (GLUCOPHAGE-XR) 500 MG  24 hr tablet Take 2 tablets (1,000 mg total) by mouth in the morning and at bedtime. 360 tablet 1  . methocarbamol (ROBAXIN) 500 MG tablet TAKE ONE TABLET BY MOUTH FOUR TIMES A DAY 45 tablet 1  . methylPREDNISolone (MEDROL DOSEPAK) 4 MG TBPK tablet Use as directed 21 tablet 0  . nitrofurantoin, macrocrystal-monohydrate, (MACROBID) 100 MG capsule TAKE 1 CAPSULE BY MOUTH IN THE MORNING AND 1 CAPSULE BY MOUTH IN THE EVENING FOR 5 DAYS    . tamsulosin (FLOMAX) 0.4 MG CAPS capsule Take 1 capsule (0.4 mg total) by mouth daily. 90 capsule 3  . traMADol (ULTRAM) 50 MG tablet TAKE ONE TO TWO TABLETS BY MOUTH EVERY 8 HOURS AS NEEDED FOR PAIN 60 tablet 1  . pravastatin (PRAVACHOL) 40 MG tablet TAKE  ONE TABLET BY MOUTH ONE TIME DAILY 30 tablet 2   No current facility-administered medications on file prior to visit.    Allergies: No Known Allergies  Vital Signs:  BP 107/66   Pulse 68   Ht 5\' 11"  (1.803 m)   Wt 278 lb (126.1 kg)   SpO2 97%   BMI 38.77 kg/m   Neurological Exam: MENTAL STATUS including orientation to time, place, person, recent and remote memory, attention span and concentration, language, and fund of knowledge is normal.  Speech is not dysarthric.  CRANIAL NERVES:  No visual field defects.  Pupils equal round and reactive to light.  Normal conjugate, extra-ocular eye movements in all directions of gaze.  No ptosis.  Face is symmetric.  MOTOR:  No atrophy, fasciculations or abnormal movements.  No pronator drift.   Upper Extremity:  Right  Left  Deltoid  5/5   5/5   Biceps  5/5   5/5   Triceps  5/5   5/5   Infraspinatus 5/5  5/5  Medial pectoralis 5/5  5/5  Wrist extensors  5/5   5/5   Wrist flexors  5/5   5/5   Finger extensors  5/5   5/5   Finger flexors  5/5   5/5   Dorsal interossei  5/5   5/5   Abductor pollicis  5/5   5/5   Tone (Ashworth scale)  0  0   Lower Extremity:  Right  Left  Hip flexors * 4+/5   3/5   Hip extensors *  4/5   3/5   Adductor * 4/5  3/5  Abductor * 5/5  5/5  Knee flexors *  5/5   4/5   Knee extensors  5/5   4-/5   Dorsiflexors  5/5   2/5   Plantarflexors  5/5   2/5   Toe extensors  5/5   2/5   Toe flexors  5/5   2/5   Tone (Ashworth scale)  0  0  * improved  MSRs:                                           Right        Left brachioradialis 2+  2+  biceps 2+  2+  triceps 2+  2+  patellar 0  0  ankle jerk 0  0  plantar response down  down   SENSORY:  Intact to vibration at the right knee and hands, absent vibration at the left knee and bilateral ankles.   COORDINATION/GAIT:  Gait not tested, patient arrived in  wheelchair   Data: CSF 11/09/2020:  R3  W3  P124* G95*  IgG index 0.6, OCB neg, VDRL neg, cytology  neg  Lab Results  Component Value Date   HGBA1C 9.9 (A) 01/03/2021   CT head and cervical spine 01/03/2021: 1. No evidence of acute intracranial process. 2. No evidence of acute fracture or traumatic listhesis of the cervical spine. 3. Stable burden of mild chronic microvascular ischemic disease and age related global parenchymal volume loss. 4. Mild lower cervical spondylosis.  MRI cervical spine wwo contrast 11/06/2020:   1. Small disc herniations at C3-C4 and C4-C5 result in up to mild spinal stenosis with no significant cord mass effect. No abnormal cord signal. 2. Up to moderate degenerative neural foraminal stenosis at the left C4 and left C7 nerve levels. Mild for age cervical spine degeneration otherwise. 3. Nonspecific T2 and STIR hyperintensity in the pons. Most commonly this would be due to chronic small vessel disease. Normal appearing cervical cord  MRI thoracic spine wwo contrast 11/06/2020: Normal appearing thoracic cord. No finding to explain the patient's symptoms. No change in small right paracentral protrusion at T4-5 without stenosis.  MRI lumbar spine wwo contrast 11/05/2017: 1. Mild nonspecific inflammation in the right erector spinae muscle at L2-L3, and on the left at S1-S2. These might be posttraumatic in this setting.  2. No other acute or inflammatory process identified in the lumbar spine.  3. Mild lumbar spine degeneration without spinal stenosis. Rightward L2-L3 disc protrusion with annular fissure does appear mildly increased since December. Query right L3 radiculitis. Moderately advanced chronic facet degeneration on the left at L5-S1.   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 With his CSF glucose being elevated, this favors diabetic polyradiculoneuropathy moreso than immune-mediated  CIDP, where glucose would be normal. His exam does show some improvement of leg strength, especially in the right leg. He continues to have severe left foot drop. Management is targeted at optimizing his diabetes and physical therapy to improve strength.  There is no role for plasmapheresis or steroids.   PLAN/RECOMMENDATIONS:  Continue supportive care Continue gabapentin 600mg  in the morning, 300mg  in afternoon, and 300mg  at bedtime.   Continue home PT Recommend using left AFO Diabetes management as per endocrinology Fall precautions discussed  Return to clinic in 6 months  Total time spent reviewing records, interview, history/exam, documentation, and coordination of care on day of encounter:  45 min     Thank you for allowing me to participate in patient's care.  If I can answer any additional questions, I would be pleased to do so.    Sincerely,    Varsha Knock K. Posey Pronto, DO

## 2021-01-19 NOTE — Patient Instructions (Signed)
Visit Information  PATIENT GOALS: Goals Addressed              This Visit's Progress   .  Receive Short-Term Rehabilitative Services in a [facility]. (pt-stated)   On track     Timeframe:  Short-Range Goal Priority:  High Start Date:  01/04/2021                          Expected End Date:  01/27/2021  Follow-Up Plan: 01/27/2021 at 9:00am.  Patient Goals/Self-Care Activities: . Continue to work with home health nursing, physical therapist, occupational therapist and social worker with [facility]. . Answer all calls from LCSW [phone]). . LCSW received a denial from [place] Department with regards to approving you for short-term rehabilitative services in a skilled nursing facility.   . Discussed placement options with patient and encouraged him to consider independent living, assisted living, residential care and/or group home placement. . Patient will contact LCSW after his 10:50am appointment today (Thursday, January 19, 2021) with Neurologist, Dr. Narda Amber at [facility], to report findings and notify LCSW of his decision regarding placement.          Patient verbalizes understanding of instructions provided today and agrees to view in [place].   Telephone follow up appointment with care management team member scheduled for:  01/27/2021 at [place] Licensed Clinical Social Worker [facility] [place] [phone]

## 2021-01-20 ENCOUNTER — Other Ambulatory Visit: Payer: Self-pay

## 2021-01-20 ENCOUNTER — Ambulatory Visit: Payer: Medicare HMO | Admitting: Pharmacist

## 2021-01-20 ENCOUNTER — Emergency Department (HOSPITAL_COMMUNITY)
Admission: EM | Admit: 2021-01-20 | Discharge: 2021-01-21 | Disposition: A | Discharge status: Home / Self Care | Payer: Medicare HMO | Attending: Emergency Medicine | Admitting: Emergency Medicine

## 2021-01-20 ENCOUNTER — Encounter: Payer: Self-pay | Admitting: Internal Medicine

## 2021-01-20 ENCOUNTER — Other Ambulatory Visit: Payer: Self-pay | Admitting: Internal Medicine

## 2021-01-20 ENCOUNTER — Other Ambulatory Visit: Payer: Self-pay | Admitting: Family Medicine

## 2021-01-20 DIAGNOSIS — Z7984 Long term (current) use of oral hypoglycemic drugs: Secondary | ICD-10-CM | POA: Diagnosis not present

## 2021-01-20 DIAGNOSIS — R531 Weakness: Secondary | ICD-10-CM

## 2021-01-20 DIAGNOSIS — Z743 Need for continuous supervision: Secondary | ICD-10-CM | POA: Diagnosis not present

## 2021-01-20 DIAGNOSIS — E785 Hyperlipidemia, unspecified: Secondary | ICD-10-CM

## 2021-01-20 DIAGNOSIS — Z794 Long term (current) use of insulin: Secondary | ICD-10-CM

## 2021-01-20 DIAGNOSIS — F17210 Nicotine dependence, cigarettes, uncomplicated: Secondary | ICD-10-CM | POA: Diagnosis not present

## 2021-01-20 DIAGNOSIS — I10 Essential (primary) hypertension: Secondary | ICD-10-CM | POA: Diagnosis not present

## 2021-01-20 DIAGNOSIS — R1084 Generalized abdominal pain: Secondary | ICD-10-CM

## 2021-01-20 DIAGNOSIS — E1165 Type 2 diabetes mellitus with hyperglycemia: Secondary | ICD-10-CM

## 2021-01-20 DIAGNOSIS — G8929 Other chronic pain: Secondary | ICD-10-CM

## 2021-01-20 DIAGNOSIS — K30 Functional dyspepsia: Secondary | ICD-10-CM | POA: Insufficient documentation

## 2021-01-20 DIAGNOSIS — M549 Dorsalgia, unspecified: Secondary | ICD-10-CM | POA: Insufficient documentation

## 2021-01-20 DIAGNOSIS — I959 Hypotension, unspecified: Secondary | ICD-10-CM | POA: Diagnosis not present

## 2021-01-20 DIAGNOSIS — S61012A Laceration without foreign body of left thumb without damage to nail, initial encounter: Secondary | ICD-10-CM | POA: Diagnosis not present

## 2021-01-20 DIAGNOSIS — R69 Illness, unspecified: Secondary | ICD-10-CM | POA: Diagnosis not present

## 2021-01-20 DIAGNOSIS — M545 Low back pain, unspecified: Secondary | ICD-10-CM

## 2021-01-20 DIAGNOSIS — I2510 Atherosclerotic heart disease of native coronary artery without angina pectoris: Secondary | ICD-10-CM | POA: Diagnosis not present

## 2021-01-20 DIAGNOSIS — Z79899 Other long term (current) drug therapy: Secondary | ICD-10-CM | POA: Insufficient documentation

## 2021-01-20 DIAGNOSIS — E1142 Type 2 diabetes mellitus with diabetic polyneuropathy: Secondary | ICD-10-CM | POA: Diagnosis not present

## 2021-01-20 DIAGNOSIS — R1030 Lower abdominal pain, unspecified: Secondary | ICD-10-CM | POA: Diagnosis not present

## 2021-01-20 LAB — CBC WITH DIFFERENTIAL/PLATELET
Abs Immature Granulocytes: 0.05 10*3/uL (ref 0.00–0.07)
Basophils Absolute: 0.1 10*3/uL (ref 0.0–0.1)
Basophils Relative: 1 %
Eosinophils Absolute: 0.1 10*3/uL (ref 0.0–0.5)
Eosinophils Relative: 1 %
HCT: 38.7 % — ABNORMAL LOW (ref 39.0–52.0)
Hemoglobin: 13.1 g/dL (ref 13.0–17.0)
Immature Granulocytes: 1 %
Lymphocytes Relative: 25 %
Lymphs Abs: 1.8 10*3/uL (ref 0.7–4.0)
MCH: 30.3 pg (ref 26.0–34.0)
MCHC: 33.9 g/dL (ref 30.0–36.0)
MCV: 89.6 fL (ref 80.0–100.0)
Monocytes Absolute: 0.9 10*3/uL (ref 0.1–1.0)
Monocytes Relative: 12 %
Neutro Abs: 4.5 10*3/uL (ref 1.7–7.7)
Neutrophils Relative %: 60 %
Platelets: 277 10*3/uL (ref 150–400)
RBC: 4.32 MIL/uL (ref 4.22–5.81)
RDW: 12.5 % (ref 11.5–15.5)
WBC: 7.3 10*3/uL (ref 4.0–10.5)
nRBC: 0 % (ref 0.0–0.2)

## 2021-01-20 LAB — COMPREHENSIVE METABOLIC PANEL
ALT: 18 U/L (ref 0–44)
AST: 16 U/L (ref 15–41)
Albumin: 3.5 g/dL (ref 3.5–5.0)
Alkaline Phosphatase: 72 U/L (ref 38–126)
Anion gap: 7 (ref 5–15)
BUN: 24 mg/dL — ABNORMAL HIGH (ref 6–20)
CO2: 27 mmol/L (ref 22–32)
Calcium: 9 mg/dL (ref 8.9–10.3)
Chloride: 102 mmol/L (ref 98–111)
Creatinine, Ser: 0.98 mg/dL (ref 0.61–1.24)
GFR, Estimated: >60 mL/min (ref >60)
Glucose, Bld: 185 mg/dL — ABNORMAL HIGH (ref 70–99)
Potassium: 3.8 mmol/L (ref 3.5–5.1)
Sodium: 136 mmol/L (ref 135–145)
Total Bilirubin: 0.5 mg/dL (ref 0.3–1.2)
Total Protein: 6.3 g/dL — ABNORMAL LOW (ref 6.5–8.1)

## 2021-01-20 LAB — LIPASE, BLOOD: Lipase: 27 U/L (ref 11–51)

## 2021-01-20 MED ORDER — LIDOCAINE VISCOUS HCL 2 % MT SOLN
15.0000 mL | Freq: Once | OROMUCOSAL | Status: AC
  Administered 2021-01-20: 15 mL via ORAL
  Filled 2021-01-20: qty 15

## 2021-01-20 MED ORDER — ALUM & MAG HYDROXIDE-SIMETH 200-200-20 MG/5ML PO SUSP
30.0000 mL | Freq: Once | ORAL | Status: AC
  Administered 2021-01-20: 30 mL via ORAL
  Filled 2021-01-20: qty 30

## 2021-01-20 NOTE — ED Provider Notes (Signed)
[facility] DEPT Provider Note   CSN: [id number] Arrival date & time: 01/20/21  2041     History Chief Complaint  Patient presents with  . Abdominal Pain    Tyler Brewer is a 60 y.o. male.  Patient with history of T2DM, IBS, HTN, HLD, OSA, methamphetamine addiction, CAD presents for evaluation of lower abdominal pain that started around 6:00 pm tonight, about 1 hour after eating 'lasagne soup' for dinner. No fever, nausea or vomiting. Last bowel movement was earlier today and was normal for him. No chest pain or SOB. No urinary symptoms. Since arriving to the hospital, he reports he has belched several times and feels much better. He reports having significant pain in his back that is c/w his chronic pain. He is prescribed tramadol and states that does not relieve his pain.   The history is provided by the patient. No language interpreter was used.  Abdominal Pain Associated symptoms: no chills, no constipation, no diarrhea, no fever, no nausea and no vomiting        Past Medical History:  Diagnosis Date  . Arthritis   . CAD (coronary artery disease)  cardiac cath with moderate disease in a septal branch of the ramus intermedius 04/01/2012  . Depression   . Diabetes mellitus    poorly controlled by his report  . History of narcotic addiction ([place])    past history of back pain  . Hypercholesteremia   . Hypertension   . IBS (irritable bowel syndrome)   . 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[place])   . Neuropathy   . Obesity    Max weight was 390  . OSA on CPAP   . Panic attacks   . Testosterone deficiency   . Vertigo     Patient Active Problem List   Diagnosis Date Noted  . 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/12/2021  . Diabetes mellitus ([place]) 01/03/2021  . Type 2 diabetes mellitus with hyperglycemia, with long-term current use of insulin ([place]) 01/03/2021  . Lower extremity edema 11/28/2020  . Chronic low back pain  11/28/2020  . Psoriatic arthritis ([place]) 11/28/2020  . Thoracic aortic aneurysm without rupture ([place]) 11/28/2020  . Bilateral leg weakness 11/05/2020  . Acute left-sided low back pain with right-sided sciatica 07/19/2020  . Hip pain, acute, left 07/05/2020  . Tremor 07/05/2020  . Weakness 07/05/2020  . Balance problem 03/11/2020  . Tinea cruris 03/11/2020  . Cellulitis of left groin 03/11/2020  . Acute pain of right shoulder 03/11/2020  . Interstitial lung disease ([place]) 05/02/2017  . Pansinusitis 09/26/2016  . Diabetic polyneuropathy associated with diabetes mellitus due to underlying condition ([place]) 05/08/2016  . Methamphetamine use disorder, severe, dependence ([place]) 02/11/2016  . Substance induced mood disorder ([place]) 02/11/2016  . Exertional dyspnea 11/30/2015  . ADD (attention deficit disorder) 09/29/2015  . Binge eating 09/29/2015  . Syncope 05/05/2012  . Diarrhea 05/05/2012  . Panic attacks 05/05/2012  . OSA (obstructive sleep apnea) 05/05/2012  . HTN (hypertension) 05/05/2012  . Dyslipidemia 05/05/2012  . CAD (coronary artery disease)  cardiac cath with moderate disease in a septal branch of the ramus intermedius 04/01/2012  . Chest pain 04/01/2012  . Drug abuse and dependence ([place]) 04/01/2012  . Family history of coronary artery disease 03/31/2012  . Sleep apnea, on C-pap 03/31/2012  . Hyperlipemia 03/30/2012  . HTN (hypertension), benign 03/30/2012  . Morbid obesity ([place]) 03/30/2012  . DM type 2, uncontrolled, with neuropathy ([place]) 03/30/2012  . Depression with suicidal ideation 03/30/2012  Past Surgical History:  Procedure Laterality Date  . CARDIAC CATHETERIZATION    . IR FLUORO GUIDE CV LINE RIGHT  11/10/2020  . IR US GUIDE VASC ACCESS RIGHT  11/10/2020  . LEFT HEART CATHETERIZATION WITH CORONARY ANGIOGRAM N/A 03/31/2012   Procedure: LEFT HEART CATHETERIZATION WITH CORONARY ANGIOGRAM;  Surgeon: Leonie Man, MD;  Location: [facility] CATH LAB;  Service: Cardiovascular;   Laterality: N/A;       Family History  Problem Relation Age of Onset  . Diabetes type II Father   . Hypertension Father   . Pancreatic disease Father 69       Deceased  . Healthy Mother   . Healthy Sister   . Healthy Son   . Healthy Daughter   . Parkinson's disease Maternal Grandmother     Social History   Tobacco Use  . Smoking status: Current Every Day Smoker    Packs/day: 0.50    Types: Cigarettes    Last attempt to quit: 09/29/2020    Years since quitting: 0.3  . Smokeless tobacco: Never Used  Vaping Use  . Vaping Use: Never used  Substance Use Topics  . Alcohol use: No    Alcohol/week: 0.0 standard drinks  . Drug use: Not Currently    Types: Methamphetamines    Home Medications Prior to Admission medications   Medication Sig Start Date End Date Taking? Authorizing Provider  acetaminophen (TYLENOL) 500 MG tablet Take 500 mg by mouth every 6 (six) hours as needed. States he is taking one to two tablets a day    [provider]  amLODipine (NORVASC) 10 MG tablet Take 1 tablet (10 mg total) by mouth daily. 01/11/21   [name], DO  diclofenac Sodium (VOLTAREN) 1 % GEL Apply 2 g topically 4 (four) times daily. Patient not taking: Reported on 01/20/2021 08/27/20   Corena Herter, PA-C  DULoxetine (CYMBALTA) 30 MG capsule Take 1 capsule (30 mg total) by mouth daily. Patient taking differently: Take 30 mg by mouth 3 (three) times a week. 07/19/20   [name] R, DO  furosemide (LASIX) 20 MG tablet TAKE 1 TABLET(20 MG) BY MOUTH DAILY 12/21/20   Carollee Herter, Alferd Apa, DO  gabapentin (NEURONTIN) 300 MG capsule Take 2 tablets in the morning, 1 tablet in the afternoon, and 1 tablet at bedtime. Patient taking differently: Take 300-600 mg by mouth See admin instructions. Take 600 mg in the morning, 300 mg in the afternoon and at bedtime 10/27/20   Narda Amber K, DO  hydrALAZINE (APRESOLINE) 50 MG tablet Take 1 tablet (50 mg total) by mouth every 8  (eight) hours. 11/21/20   Thurnell Lose, MD  ibuprofen (ADVIL) 200 MG tablet Take 200 mg by mouth every 6 (six) hours as needed.    [provider]  insulin aspart (NOVOLOG FLEXPEN) 100 UNIT/ML FlexPen Inject 12 Units into the skin 3 (three) times daily with meals. 01/03/21   [name], Melanie Crazier, MD  insulin degludec (TRESIBA FLEXTOUCH) 200 UNIT/ML FlexTouch Pen Inject 46 Units into the skin daily. 01/03/21   [name], Melanie Crazier, MD  Insulin Pen Needle 31G X 5 MM MISC 1 Device by Does not apply route in the morning, at noon, in the evening, and at bedtime. 01/03/21   [name], Melanie Crazier, MD  losartan (COZAAR) 100 MG tablet Take 1 tablet (100 mg total) by mouth daily. 07/05/20   [name], DO  Menthol, Topical Analgesic, (ICY HOT EX) Apply topically  as needed. Patient not taking: Reported on 01/20/2021    [provider]  metFORMIN (GLUCOPHAGE-XR) 500 MG 24 hr tablet Take 2 tablets (1,000 mg total) by mouth in the morning and at bedtime. 01/03/21   [name], Melanie Crazier, MD  methocarbamol (ROBAXIN) 500 MG tablet TAKE ONE TABLET BY MOUTH FOUR TIMES A DAY 01/11/21   Carollee Herter, Alferd Apa, DO  methylPREDNISolone (MEDROL DOSEPAK) 4 MG TBPK tablet Use as directed 01/17/21   Wyvonnia Dusky, MD  pravastatin (PRAVACHOL) 40 MG tablet TAKE ONE TABLET BY MOUTH ONE TIME DAILY 12/26/20   Carollee Herter, Alferd Apa, DO  tamsulosin (FLOMAX) 0.4 MG CAPS capsule Take 1 capsule (0.4 mg total) by mouth daily. 03/10/20   [name], DO  traMADol (ULTRAM) 50 MG tablet TAKE ONE TO TWO TABLETS BY MOUTH EVERY 8 HOURS AS NEEDED FOR PAIN 01/09/21   [name], DO    Allergies    Patient has no known allergies.  Review of Systems   Review of Systems  Constitutional: Negative for chills and fever.  HENT: Negative.   Respiratory: Negative.   Cardiovascular: Negative.   Gastrointestinal: Positive for abdominal pain. Negative for constipation, diarrhea,  nausea and vomiting.  Genitourinary: Negative.   Musculoskeletal: Negative.   Skin: Negative.   Neurological: Negative.     Physical Exam Updated Vital Signs BP 137/70   Pulse 81   Temp 97.7 F (36.5 C) (Oral)   Resp 18   SpO2 98%   Physical Exam Vitals and nursing note reviewed.  Constitutional:      General: He is not in acute distress.    Appearance: He is well-developed. He is obese. He is not ill-appearing.  HENT:     Head: Normocephalic.  Cardiovascular:     Rate and Rhythm: Normal rate and regular rhythm.  Pulmonary:     Effort: Pulmonary effort is normal.     Breath sounds: Normal breath sounds. No wheezing, rhonchi or rales.  Abdominal:     General: Bowel sounds are normal.     Palpations: Abdomen is soft.     Tenderness: There is generalized abdominal tenderness. There is no guarding or rebound.  Musculoskeletal:        General: Normal range of motion.     Cervical back: Normal range of motion and neck supple.  Skin:    General: Skin is warm and dry.     Findings: No rash.  Neurological:     Mental Status: He is alert and oriented to person, place, and time.     ED Results / Procedures / Treatments   Labs (all labs ordered are listed, but only abnormal results are displayed) Labs Reviewed  CBC WITH DIFFERENTIAL/PLATELET - Abnormal; Notable for the following components:      Result Value   HCT 38.7 (*)    All other components within normal limits  COMPREHENSIVE METABOLIC PANEL  URINALYSIS, ROUTINE W REFLEX MICROSCOPIC  LIPASE, BLOOD   Results for orders placed or performed during the hospital encounter of 01/20/21  CBC with Differential/Platelet  Result Value Ref Range   WBC 7.3 4.0 - 10.5 K/uL   RBC 4.32 4.22 - 5.81 MIL/uL   Hemoglobin 13.1 13.0 - 17.0 g/dL   HCT 38.7 (L) 39.0 - 52.0 %   MCV 89.6 80.0 - 100.0 fL   MCH 30.3 26.0 - 34.0 pg   MCHC 33.9 30.0 - 36.0 g/dL   RDW 12.5 11.5 - 15.5 %  Platelets 277 150 - 400 K/uL   nRBC 0.0 0.0 -  0.2 %   Neutrophils Relative % 60 %   Neutro Abs 4.5 1.7 - 7.7 K/uL   Lymphocytes Relative 25 %   Lymphs Abs 1.8 0.7 - 4.0 K/uL   Monocytes Relative 12 %   Monocytes Absolute 0.9 0.1 - 1.0 K/uL   Eosinophils Relative 1 %   Eosinophils Absolute 0.1 0.0 - 0.5 K/uL   Basophils Relative 1 %   Basophils Absolute 0.1 0.0 - 0.1 K/uL   Immature Granulocytes 1 %   Abs Immature Granulocytes 0.05 0.00 - 0.07 K/uL  Comprehensive metabolic panel  Result Value Ref Range   Sodium 136 135 - 145 mmol/L   Potassium 3.8 3.5 - 5.1 mmol/L   Chloride 102 98 - 111 mmol/L   CO2 27 22 - 32 mmol/L   Glucose, Bld 185 (H) 70 - 99 mg/dL   BUN 24 (H) 6 - 20 mg/dL   Creatinine, Ser 0.98 0.61 - 1.24 mg/dL   Calcium 9.0 8.9 - 10.3 mg/dL   Total Protein 6.3 (L) 6.5 - 8.1 g/dL   Albumin 3.5 3.5 - 5.0 g/dL   AST 16 15 - 41 U/L   ALT 18 0 - 44 U/L   Alkaline Phosphatase 72 38 - 126 U/L   Total Bilirubin 0.5 0.3 - 1.2 mg/dL   GFR, Estimated >60 >60 mL/min   Anion gap 7 5 - 15  Lipase, blood  Result Value Ref Range   Lipase 27 11 - 51 U/L   EKG None  Radiology No results found.  Procedures Procedures   Medications Ordered in ED Medications - No data to display  ED Course  I have reviewed the triage vital signs and the nursing notes.  Pertinent labs & imaging results that were available during my care of the patient were reviewed by me and considered in my medical decision making (see chart for details).    MDM Rules/Calculators/A&P                          Patient to ED for abdominal pain as detailed in the HPI. He reports his presenting symptoms are much better after belching. He is requesting something for his back pain.   GI cocktail provided to assess whether this provides complete resolution of his abdominal pain.   Tramadol given for regular chronic pain as per his regular dose as decided by his primary care. He reports that he will start with Pain Management in the near future.   GI  cocktail with resolution of symptoms of abdominal pain, felt like indigestion. Labs unremarkable. Abdominal exam benign. He can be discharged home to primary care follow up.   He is requesting PTAR transportation home secondary to mobility issues. Discussed that he has been observed ambulating to and from the bathroom without assistance. Do not feel he meets criteria for PTAR transportation.    Final Clinical Impression(s) / ED Diagnoses Final diagnoses:  None   1. Indigestion 2. Chronic back pain  Rx / DC Orders ED Discharge Orders    None       Dennie Bible 01/24/21 2305    Breck Coons, MD 01/26/21 0700

## 2021-01-20 NOTE — ED Triage Notes (Signed)
Patient is A&O x4 from home complaints of left lower abdomin pain started 3 hours ago. No nausea or vomiting.   BP: 128/70 HR: 88- RR 18- 98%RA CBG:232

## 2021-01-20 NOTE — Telephone Encounter (Signed)
Pt seen by Neuro yesterday. Notes in Epic. Please advise

## 2021-01-20 NOTE — Telephone Encounter (Signed)
Referral placed for 2nd opinion at [facility]

## 2021-01-21 ENCOUNTER — Emergency Department (HOSPITAL_COMMUNITY)
Admission: EM | Admit: 2021-01-21 | Discharge: 2021-01-21 | Discharge status: Against Medical Advice | Payer: Medicare HMO

## 2021-01-21 DIAGNOSIS — Z743 Need for continuous supervision: Secondary | ICD-10-CM | POA: Diagnosis not present

## 2021-01-21 DIAGNOSIS — S61012A Laceration without foreign body of left thumb without damage to nail, initial encounter: Secondary | ICD-10-CM | POA: Diagnosis not present

## 2021-01-21 DIAGNOSIS — I10 Essential (primary) hypertension: Secondary | ICD-10-CM | POA: Diagnosis not present

## 2021-01-21 DIAGNOSIS — R609 Edema, unspecified: Secondary | ICD-10-CM | POA: Diagnosis not present

## 2021-01-21 DIAGNOSIS — E1165 Type 2 diabetes mellitus with hyperglycemia: Secondary | ICD-10-CM | POA: Diagnosis not present

## 2021-01-21 DIAGNOSIS — R1084 Generalized abdominal pain: Secondary | ICD-10-CM | POA: Diagnosis not present

## 2021-01-21 MED ORDER — TRAMADOL HCL 50 MG PO TABS
50.0000 mg | ORAL_TABLET | Freq: Once | ORAL | Status: AC
Start: 2021-01-21 — End: 2021-01-21
  Administered 2021-01-21: 50 mg via ORAL
  Filled 2021-01-21: qty 1

## 2021-01-21 NOTE — ED Notes (Signed)
Patient states he wont stay for treatment

## 2021-01-21 NOTE — Discharge Instructions (Addendum)
Follow up with your doctor as needed for any recurrent symptoms.

## 2021-01-21 NOTE — ED Notes (Signed)
Patient discharged no concerns at this time  

## 2021-01-23 ENCOUNTER — Emergency Department (HOSPITAL_BASED_OUTPATIENT_CLINIC_OR_DEPARTMENT_OTHER): Payer: Medicare HMO

## 2021-01-23 ENCOUNTER — Other Ambulatory Visit: Payer: Self-pay | Admitting: Family Medicine

## 2021-01-23 ENCOUNTER — Encounter (HOSPITAL_BASED_OUTPATIENT_CLINIC_OR_DEPARTMENT_OTHER): Payer: Self-pay

## 2021-01-23 ENCOUNTER — Encounter: Payer: Self-pay | Admitting: Family Medicine

## 2021-01-23 ENCOUNTER — Other Ambulatory Visit: Payer: Self-pay

## 2021-01-23 ENCOUNTER — Emergency Department (HOSPITAL_BASED_OUTPATIENT_CLINIC_OR_DEPARTMENT_OTHER)
Admission: EM | Admit: 2021-01-23 | Discharge: 2021-01-23 | Disposition: A | Discharge status: Home / Self Care | Payer: Medicare HMO | Attending: Emergency Medicine | Admitting: Emergency Medicine

## 2021-01-23 DIAGNOSIS — F17210 Nicotine dependence, cigarettes, uncomplicated: Secondary | ICD-10-CM | POA: Insufficient documentation

## 2021-01-23 DIAGNOSIS — E1142 Type 2 diabetes mellitus with diabetic polyneuropathy: Secondary | ICD-10-CM | POA: Insufficient documentation

## 2021-01-23 DIAGNOSIS — Z79899 Other long term (current) drug therapy: Secondary | ICD-10-CM | POA: Diagnosis not present

## 2021-01-23 DIAGNOSIS — I10 Essential (primary) hypertension: Secondary | ICD-10-CM | POA: Diagnosis not present

## 2021-01-23 DIAGNOSIS — R531 Weakness: Secondary | ICD-10-CM | POA: Diagnosis not present

## 2021-01-23 DIAGNOSIS — W19XXXA Unspecified fall, initial encounter: Secondary | ICD-10-CM | POA: Diagnosis not present

## 2021-01-23 DIAGNOSIS — M545 Low back pain, unspecified: Secondary | ICD-10-CM

## 2021-01-23 DIAGNOSIS — I2510 Atherosclerotic heart disease of native coronary artery without angina pectoris: Secondary | ICD-10-CM | POA: Insufficient documentation

## 2021-01-23 DIAGNOSIS — Z794 Long term (current) use of insulin: Secondary | ICD-10-CM | POA: Diagnosis not present

## 2021-01-23 DIAGNOSIS — E1140 Type 2 diabetes mellitus with diabetic neuropathy, unspecified: Secondary | ICD-10-CM

## 2021-01-23 DIAGNOSIS — G8929 Other chronic pain: Secondary | ICD-10-CM

## 2021-01-23 DIAGNOSIS — R69 Illness, unspecified: Secondary | ICD-10-CM | POA: Diagnosis not present

## 2021-01-23 DIAGNOSIS — Z7984 Long term (current) use of oral hypoglycemic drugs: Secondary | ICD-10-CM | POA: Diagnosis not present

## 2021-01-23 DIAGNOSIS — M25562 Pain in left knee: Secondary | ICD-10-CM | POA: Diagnosis not present

## 2021-01-23 DIAGNOSIS — Z743 Need for continuous supervision: Secondary | ICD-10-CM | POA: Diagnosis not present

## 2021-01-23 DIAGNOSIS — R609 Edema, unspecified: Secondary | ICD-10-CM | POA: Diagnosis not present

## 2021-01-23 IMAGING — DX DG KNEE COMPLETE 4+V*L*
4 series · 4 of 4 positions shown · non-contrast
Comparison: None.

CLINICAL DATA: Knee pain.

EXAM:
LEFT KNEE - COMPLETE 4+ VIEW

[knee ap]
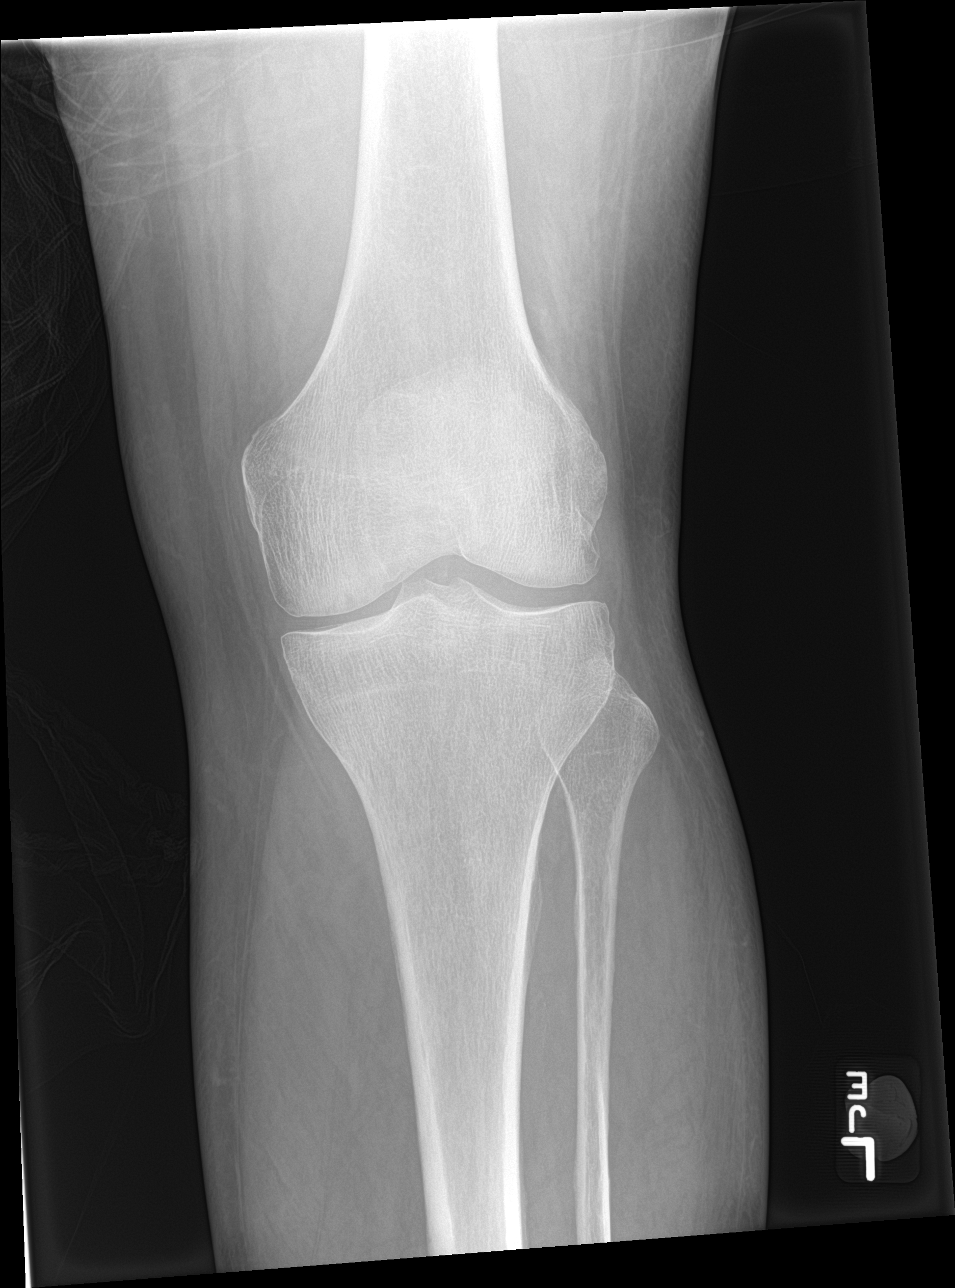

[knee lat]
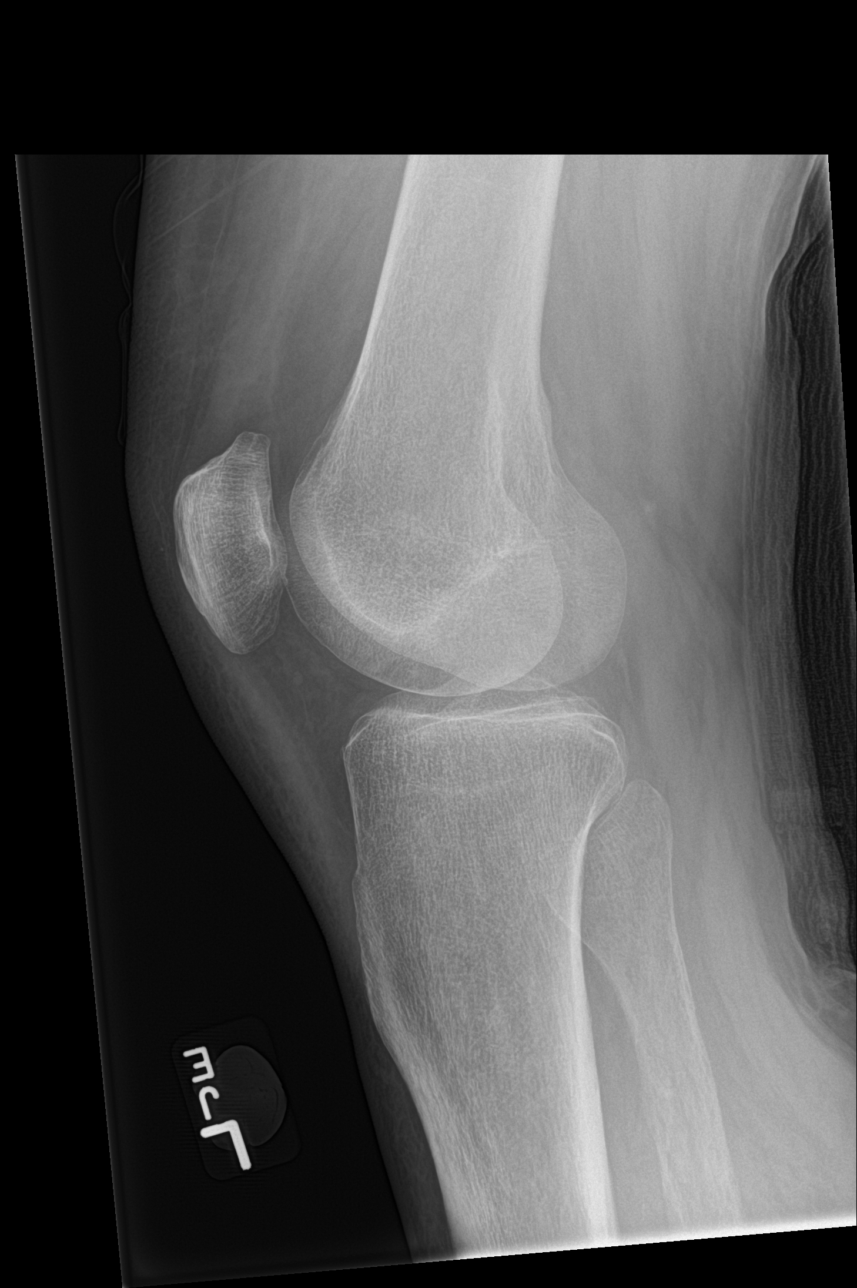

[knee obl (1 of 2)]
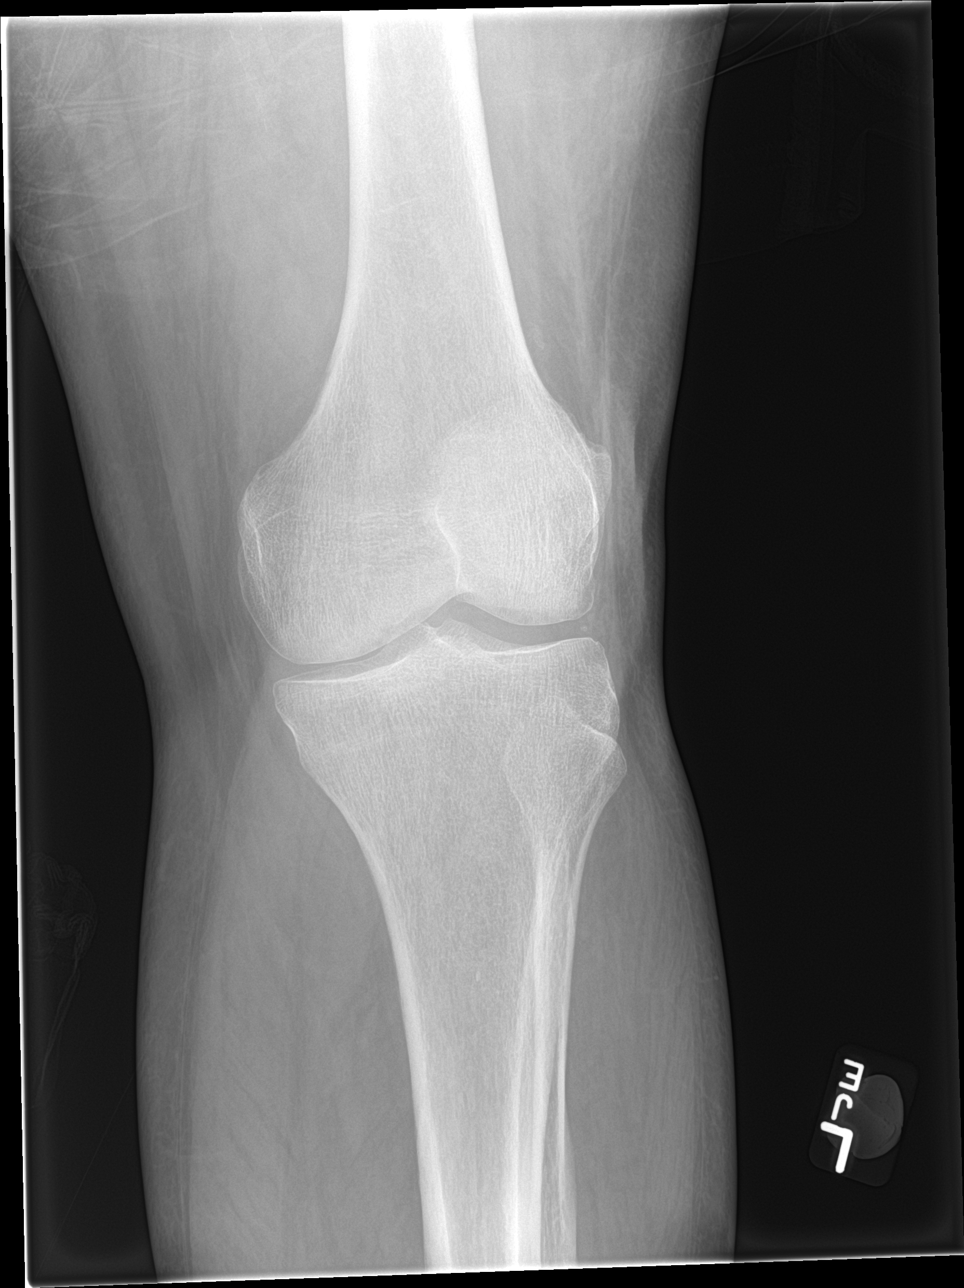

[knee obl (2 of 2)]
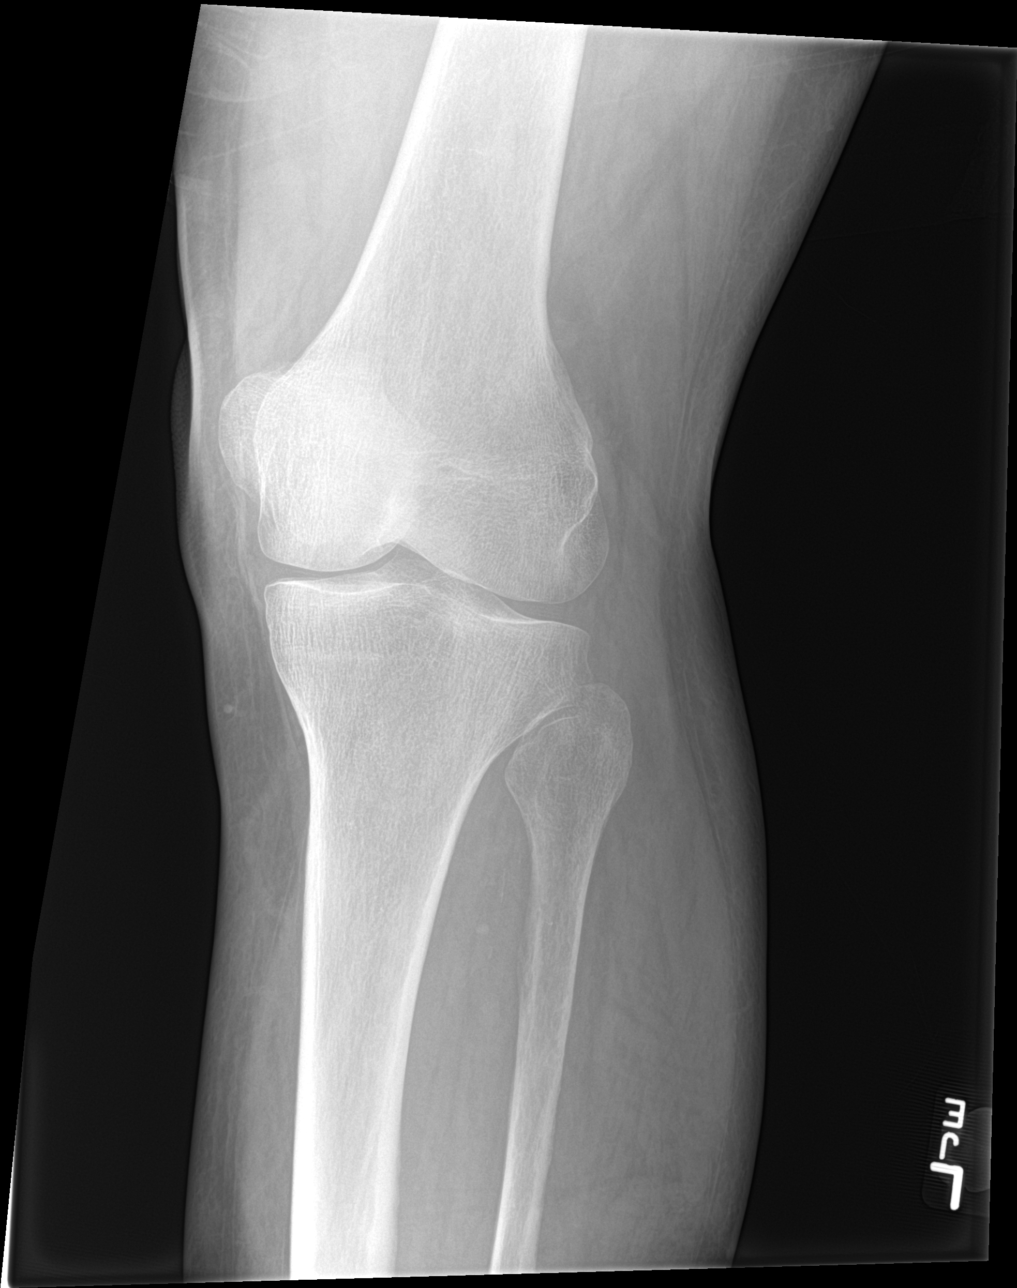

[4 of 4 positions shown; findings below may reference images not displayed]

FINDINGS: No evidence of an acute fracture, dislocation, or joint effusion. No
evidence of arthropathy or other focal bone abnormality. Soft
tissues are unremarkable.
IMPRESSION: Negative.

## 2021-01-23 MED ORDER — KETOROLAC TROMETHAMINE 60 MG/2ML IM SOLN
60.0000 mg | Freq: Once | INTRAMUSCULAR | Status: AC
  Administered 2021-01-23: 60 mg via INTRAMUSCULAR
  Filled 2021-01-23: qty 2

## 2021-01-23 MED ORDER — TRAMADOL HCL 50 MG PO TABS: ORAL_TABLET | ORAL | 1 refills | Status: DC

## 2021-01-23 MED ORDER — GABAPENTIN 300 MG PO CAPS: ORAL_CAPSULE | ORAL | 5 refills | Status: DC

## 2021-01-23 MED ORDER — OXYCODONE-ACETAMINOPHEN 5-325 MG PO TABS
1.0000 | ORAL_TABLET | Freq: Once | ORAL | Status: AC
  Administered 2021-01-23: 1 via ORAL
  Filled 2021-01-23: qty 1

## 2021-01-23 NOTE — ED Provider Notes (Signed)
[facility] EMERGENCY DEPARTMENT Provider Note   CSN: [id number] Arrival date & time: 01/23/21  1347     History Chief Complaint  Patient presents with  . Knee Pain    Left    Tyler Brewer is a 60 y.o. male past medical history of CAD, diabetes, narcotic addiction, hypertension, IBS, methamphetamine addiction who presents for evaluation of left knee pain.  He reports that his pain has been ongoing intermittently for a year.  He states that it is getting worse and none of his doctors can figure out how to help him.  He states that he sees his pain management doctor as well as his neurologist and orthopedic doctor.  They have not come up with a plan to help his pain.  He states that over the last couple days, his knee is increasingly giving out more and causing worsening pain.  He reports today, he felt like his knee gave out, causing him to fall.  He reports pain to the left knee.  No overlying warmth, erythema.  No numbness/weakness. No fevers.   The history is provided by the patient.       Past Medical History:  Diagnosis Date  . Arthritis   . CAD (coronary artery disease)  cardiac cath with moderate disease in a septal branch of the ramus intermedius 04/01/2012  . Depression   . Diabetes mellitus    poorly controlled by his report  . History of narcotic addiction ([place])    past history of back pain  . Hypercholesteremia   . Hypertension   . IBS (irritable bowel syndrome)   . 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[place])   . Neuropathy   . Obesity    Max weight was 390  . OSA on CPAP   . Panic attacks   . Testosterone deficiency   . Vertigo     Patient Active Problem List   Diagnosis Date Noted  . CIDP (chronic inflammatory demyelinating polyneuropathy) ([place]) 01/12/2021  . Diabetes mellitus ([place]) 01/03/2021  . Type 2 diabetes mellitus with hyperglycemia, with long-term current use of insulin ([employer]) 01/03/2021  . Lower extremity edema 11/28/2020  . Chronic low back pain  11/28/2020  . Psoriatic arthritis ([place]) 11/28/2020  . Thoracic aortic aneurysm without rupture ([place]) 11/28/2020  . Bilateral leg weakness 11/05/2020  . Acute left-sided low back pain with right-sided sciatica 07/19/2020  . Hip pain, acute, left 07/05/2020  . Tremor 07/05/2020  . Weakness 07/05/2020  . Balance problem 03/11/2020  . Tinea cruris 03/11/2020  . Cellulitis of left groin 03/11/2020  . Acute pain of right shoulder 03/11/2020  . Interstitial lung disease ([place]) 05/02/2017  . Pansinusitis 09/26/2016  . Diabetic polyneuropathy associated with diabetes mellitus due to underlying condition ([place]) 05/08/2016  . Methamphetamine use disorder, severe, dependence ([place]) 02/11/2016  . Substance induced mood disorder ([place]) 02/11/2016  . Exertional dyspnea 11/30/2015  . ADD (attention deficit disorder) 09/29/2015  . Binge eating 09/29/2015  . Syncope 05/05/2012  . Diarrhea 05/05/2012  . Panic attacks 05/05/2012  . OSA (obstructive sleep apnea) 05/05/2012  . HTN (hypertension) 05/05/2012  . Dyslipidemia 05/05/2012  . CAD (coronary artery disease)  cardiac cath with moderate disease in a septal branch of the ramus intermedius 04/01/2012  . Chest pain 04/01/2012  . Drug abuse and dependence ([place]) 04/01/2012  . Family history of coronary artery disease 03/31/2012  . Sleep apnea, on C-pap 03/31/2012  . Hyperlipemia 03/30/2012  . HTN (hypertension), benign 03/30/2012  . Morbid obesity ([place]) 03/30/2012  .  DM type 2, uncontrolled, with neuropathy ([place]) 03/30/2012  . Depression with suicidal ideation 03/30/2012    Past Surgical History:  Procedure Laterality Date  . CARDIAC CATHETERIZATION    . IR FLUORO GUIDE CV LINE RIGHT  11/10/2020  . IR US GUIDE VASC ACCESS RIGHT  11/10/2020  . LEFT HEART CATHETERIZATION WITH CORONARY ANGIOGRAM N/A 03/31/2012   Procedure: LEFT HEART CATHETERIZATION WITH CORONARY ANGIOGRAM;  Surgeon: Leonie Man, MD;  Location: [facility] CATH LAB;  Service: Cardiovascular;   Laterality: N/A;       Family History  Problem Relation Age of Onset  . Diabetes type II Father   . Hypertension Father   . Pancreatic disease Father 7       Deceased  . Healthy Mother   . Healthy Sister   . Healthy Son   . Healthy Daughter   . Parkinson's disease Maternal Grandmother     Social History   Tobacco Use  . Smoking status: Current Every Day Smoker    Packs/day: 0.50    Types: Cigarettes    Last attempt to quit: 09/29/2020    Years since quitting: 0.3  . Smokeless tobacco: Never Used  Vaping Use  . Vaping Use: Never used  Substance Use Topics  . Alcohol use: No    Alcohol/week: 0.0 standard drinks  . Drug use: Not Currently    Types: Methamphetamines    Home Medications Prior to Admission medications   Medication Sig Start Date End Date Taking? Authorizing Provider  acetaminophen (TYLENOL) 500 MG tablet Take 500 mg by mouth every 6 (six) hours as needed. States he is taking one to two tablets a day    [provider]  amLODipine (NORVASC) 10 MG tablet Take 1 tablet (10 mg total) by mouth daily. 01/11/21   [name], DO  diclofenac Sodium (VOLTAREN) 1 % GEL Apply 2 g topically 4 (four) times daily. Patient not taking: Reported on 01/20/2021 08/27/20   Corena Herter, PA-C  DULoxetine (CYMBALTA) 30 MG capsule Take 1 capsule (30 mg total) by mouth daily. Patient taking differently: Take 30 mg by mouth 3 (three) times a week. 07/19/20   [name] R, DO  furosemide (LASIX) 20 MG tablet TAKE 1 TABLET(20 MG) BY MOUTH DAILY 12/21/20   Carollee Herter, Alferd Apa, DO  gabapentin (NEURONTIN) 300 MG capsule Take 2 tablets tid 01/23/21   [name], DO  hydrALAZINE (APRESOLINE) 50 MG tablet Take 1 tablet (50 mg total) by mouth every 8 (eight) hours. 11/21/20   Thurnell Lose, MD  ibuprofen (ADVIL) 200 MG tablet Take 200 mg by mouth every 6 (six) hours as needed.    [provider]  insulin aspart (NOVOLOG FLEXPEN) 100 UNIT/ML  FlexPen Inject 12 Units into the skin 3 (three) times daily with meals. 01/03/21   [name], Melanie Crazier, MD  insulin degludec (TRESIBA FLEXTOUCH) 200 UNIT/ML FlexTouch Pen Inject 46 Units into the skin daily. 01/03/21   [name], Melanie Crazier, MD  Insulin Pen Needle 31G X 5 MM MISC 1 Device by Does not apply route in the morning, at noon, in the evening, and at bedtime. 01/03/21   [name], Melanie Crazier, MD  losartan (COZAAR) 100 MG tablet Take 1 tablet (100 mg total) by mouth daily. 07/05/20   [name], DO  Menthol, Topical Analgesic, (ICY HOT EX) Apply topically as needed. Patient not taking: Reported on 01/20/2021    [provider]  metFORMIN (GLUCOPHAGE-XR) 500  MG 24 hr tablet Take 2 tablets (1,000 mg total) by mouth in the morning and at bedtime. 01/03/21   [name], Melanie Crazier, MD  methocarbamol (ROBAXIN) 500 MG tablet TAKE ONE TABLET BY MOUTH FOUR TIMES A DAY 01/11/21   Carollee Herter, Alferd Apa, DO  methylPREDNISolone (MEDROL DOSEPAK) 4 MG TBPK tablet Use as directed 01/17/21   Wyvonnia Dusky, MD  pravastatin (PRAVACHOL) 40 MG tablet TAKE ONE TABLET BY MOUTH ONE TIME DAILY 12/26/20   Carollee Herter, Alferd Apa, DO  tamsulosin (FLOMAX) 0.4 MG CAPS capsule Take 1 capsule (0.4 mg total) by mouth daily. 03/10/20   [name], DO  traMADol (ULTRAM) 50 MG tablet TAKE ONE TO TWO TABLETS BY MOUTH EVERY 6 HOURS AS NEEDED FOR PAIN 01/23/21   [name], DO    Allergies    Patient has no known allergies.  Review of Systems   Review of Systems  Constitutional: Negative for fever.  Respiratory: Negative for shortness of breath.   Musculoskeletal:       Left knee pain  Neurological: Negative for weakness, numbness and headaches.  All other systems reviewed and are negative.   Physical Exam Updated Vital Signs BP (!) 154/100 (BP Location: Right Arm)   Pulse 91   Temp 98.3 F (36.8 C) (Oral)   Resp 20   Ht 5\' 11"  (1.803 m)   Wt 124.7 kg    SpO2 100%   BMI 38.35 kg/m   Physical Exam Vitals and nursing note reviewed.  Constitutional:      Appearance: He is well-developed.  HENT:     Head: Normocephalic and atraumatic.  Eyes:     General: No scleral icterus.       Right eye: No discharge.        Left eye: No discharge.     Conjunctiva/sclera: Conjunctivae normal.  Cardiovascular:     Pulses:          Radial pulses are 2+ on the right side and 2+ on the left side.  Pulmonary:     Effort: Pulmonary effort is normal.  Musculoskeletal:     Comments: Tenderness to palpation noted to the left knee.  No deformity or crepitus noted.  He can flex and extend the knee but does report pain with doing so.  Negative posterior and anterior drawer test.  No varus or valgus instability.  No bony tenderness of the tib-fib, thigh.  Skin:    General: Skin is warm and dry.     Comments: Good distal cap refill. LLE is not dusky in appearance or cool to touch.  Neurological:     Mental Status: He is alert.     Comments: Sensation intact along major nerve distributions of BUE  Psychiatric:        Speech: Speech normal.        Behavior: Behavior normal.     ED Results / Procedures / Treatments   Labs (all labs ordered are listed, but only abnormal results are displayed) Labs Reviewed - No data to display  EKG None  Radiology DG Knee Complete 4 Views Left  Result Date: 01/23/2021 CLINICAL DATA:  Knee pain. EXAM: LEFT KNEE - COMPLETE 4+ VIEW COMPARISON:  None. FINDINGS: No evidence of an acute fracture, dislocation, or joint effusion. No evidence of arthropathy or other focal bone abnormality. Soft tissues are unremarkable. IMPRESSION: Negative. Electronically Signed   By: Virgina Norfolk M.D.   On: 01/23/2021 17:20    Procedures  Procedures   Medications Ordered in ED Medications  oxyCODONE-acetaminophen (PERCOCET/ROXICET) 5-325 MG per tablet 1 tablet (1 tablet Oral Given 01/23/21 1632)  ketorolac (TORADOL) injection 60 mg (60  mg Intramuscular Given 01/23/21 1632)    ED Course  I have reviewed the triage vital signs and the nursing notes.  Pertinent labs & imaging results that were available during my care of the patient were reviewed by me and considered in my medical decision making (see chart for details).    MDM Rules/Calculators/A&P                          60 year old male who presents for evaluation of left knee pain.  He reports that he has had this pain for over a year.  He states it is getting worse.  He states his knee will frequently give out and give out today, causing him to fall.  He states that he has a pain management doctor that he follows with as well as a neurologist and orthopedic doctor.  He states that none of them are controlling his pain.  He called his doctor today and they told him he could increase his tramadol.  He states that he has not done that.  He is also supposed be wearing a brace and states that he took it off because he feels like it was too tight.  No fevers, warmth, swelling.  No numbness.  On initially arrival, he is afebrile, toxic appearing.  Vital signs are stable.  He is neurovascularly intact.  History/physical exam not concerning for septic arthritis, ischemic limb.  Given history of fall, will plan for x-ray.  Patient given short course of pain medication here in the ED.  X-ray reviewed.  Negative for any acute bony abnormality.  He has a note from Dr. Corinna Capra states that he can increase his tramadol to every 6 hours as well as increase his gabapentin to 300 3 times daily.  Discussed results with patient.  Patient is resting comfortably on bed with no signs of distress.  At this time, patient stable for discharge.  I discussed with him that given he is in a pain management contract, do not feel that he is a candidate for going home with narcotic pain medication.  Patient instructed to follow-up with his pain management doctor as well as orthopedic.  I also encouraged him to use  his knee brace as he was given to help provide support and stabilization. At this time, patient exhibits no emergent life-threatening condition that require further evaluation in ED. Patient had ample opportunity for questions and discussion. All patient's questions were answered with full understanding. Strict return precautions discussed. Patient expresses understanding and agreement to plan.   Portions of this note were generated with Lobbyist. Dictation errors may occur despite best attempts at proofreading.   Final Clinical Impression(s) / ED Diagnoses Final diagnoses:  Acute pain of left knee    Rx / DC Orders ED Discharge Orders    None       Desma Mcgregor 01/23/21 2012    Quintella Reichert, MD 01/26/21 1452

## 2021-01-23 NOTE — ED Triage Notes (Signed)
Per EMS:  Pt has chronic pain generalized.  Pt having pain to left knee which is chronic.  Pt states it "gave out" and wants it checked out.  Seen previously 3 days ago.

## 2021-01-23 NOTE — ED Notes (Signed)
Pt stated he does not want to wait for PTAR any longer. Patient called [facility] and requested to be wheeled in wheelchair to sidewalk. Pt ambulated to wheelchair without difficulty. No acute distress noted.

## 2021-01-23 NOTE — Telephone Encounter (Signed)
He can inc tramadol to every 6 hours And inc gabepentin to 300 2 po tid  Pain med app is at the end of this month--- he must keep that appointment

## 2021-01-23 NOTE — ED Triage Notes (Signed)
Pt arrives EMS c/o pain to left knee states that it gave out on him 2 times in the past 4 days.

## 2021-01-23 NOTE — Discharge Instructions (Signed)
It is important that you wear your knee brace for support and stabilization.  Please follow-up with your orthopedic doctor.  You can also follow-up with your primary care doctor.  Return the emergency department for any worsening pain, fever, numbness/weakness or any other worsening concerning symptoms.

## 2021-01-24 ENCOUNTER — Other Ambulatory Visit: Payer: Self-pay | Admitting: Family Medicine

## 2021-01-24 ENCOUNTER — Encounter: Payer: Self-pay | Admitting: Internal Medicine

## 2021-01-24 ENCOUNTER — Encounter: Payer: Self-pay | Admitting: Family Medicine

## 2021-01-24 DIAGNOSIS — G4733 Obstructive sleep apnea (adult) (pediatric): Secondary | ICD-10-CM | POA: Diagnosis not present

## 2021-01-24 DIAGNOSIS — G894 Chronic pain syndrome: Secondary | ICD-10-CM

## 2021-01-24 DIAGNOSIS — E1121 Type 2 diabetes mellitus with diabetic nephropathy: Secondary | ICD-10-CM | POA: Diagnosis not present

## 2021-01-24 DIAGNOSIS — E1142 Type 2 diabetes mellitus with diabetic polyneuropathy: Secondary | ICD-10-CM | POA: Diagnosis not present

## 2021-01-24 DIAGNOSIS — E7800 Pure hypercholesterolemia, unspecified: Secondary | ICD-10-CM | POA: Diagnosis not present

## 2021-01-24 DIAGNOSIS — M5441 Lumbago with sciatica, right side: Secondary | ICD-10-CM | POA: Diagnosis not present

## 2021-01-24 DIAGNOSIS — E1136 Type 2 diabetes mellitus with diabetic cataract: Secondary | ICD-10-CM | POA: Diagnosis not present

## 2021-01-24 DIAGNOSIS — I2510 Atherosclerotic heart disease of native coronary artery without angina pectoris: Secondary | ICD-10-CM | POA: Diagnosis not present

## 2021-01-24 DIAGNOSIS — M545 Low back pain, unspecified: Secondary | ICD-10-CM

## 2021-01-24 DIAGNOSIS — J849 Interstitial pulmonary disease, unspecified: Secondary | ICD-10-CM | POA: Diagnosis not present

## 2021-01-24 DIAGNOSIS — G8929 Other chronic pain: Secondary | ICD-10-CM | POA: Diagnosis not present

## 2021-01-24 DIAGNOSIS — I10 Essential (primary) hypertension: Secondary | ICD-10-CM | POA: Diagnosis not present

## 2021-01-24 MED ORDER — PREDNISONE 10 MG PO TABS: ORAL_TABLET | ORAL | 0 refills | Status: DC

## 2021-01-24 NOTE — Telephone Encounter (Signed)
A referral has been placed for [facility]--- it takes a while to get an appointment

## 2021-01-24 NOTE — Telephone Encounter (Signed)
Pred taper sent

## 2021-01-25 ENCOUNTER — Ambulatory Visit: Payer: Medicare HMO | Admitting: Pharmacist

## 2021-01-25 ENCOUNTER — Other Ambulatory Visit: Payer: Self-pay

## 2021-01-25 ENCOUNTER — Other Ambulatory Visit: Payer: Self-pay | Admitting: Family Medicine

## 2021-01-25 ENCOUNTER — Telehealth: Payer: Self-pay

## 2021-01-25 ENCOUNTER — Telehealth: Payer: Self-pay | Admitting: Pharmacist

## 2021-01-25 DIAGNOSIS — F32A Depression, unspecified: Secondary | ICD-10-CM | POA: Diagnosis not present

## 2021-01-25 DIAGNOSIS — G8929 Other chronic pain: Secondary | ICD-10-CM

## 2021-01-25 DIAGNOSIS — M545 Low back pain, unspecified: Secondary | ICD-10-CM

## 2021-01-25 DIAGNOSIS — R69 Illness, unspecified: Secondary | ICD-10-CM | POA: Diagnosis not present

## 2021-01-25 DIAGNOSIS — E0842 Diabetes mellitus due to underlying condition with diabetic polyneuropathy: Secondary | ICD-10-CM | POA: Diagnosis not present

## 2021-01-25 DIAGNOSIS — E1165 Type 2 diabetes mellitus with hyperglycemia: Secondary | ICD-10-CM | POA: Diagnosis not present

## 2021-01-25 DIAGNOSIS — R45851 Suicidal ideations: Secondary | ICD-10-CM | POA: Diagnosis not present

## 2021-01-25 DIAGNOSIS — Z794 Long term (current) use of insulin: Secondary | ICD-10-CM | POA: Diagnosis not present

## 2021-01-25 DIAGNOSIS — E1140 Type 2 diabetes mellitus with diabetic neuropathy, unspecified: Secondary | ICD-10-CM

## 2021-01-25 DIAGNOSIS — E785 Hyperlipidemia, unspecified: Secondary | ICD-10-CM

## 2021-01-25 DIAGNOSIS — I10 Essential (primary) hypertension: Secondary | ICD-10-CM | POA: Diagnosis not present

## 2021-01-25 MED ORDER — MELOXICAM 7.5 MG PO TABS: 7.5000 mg | ORAL_TABLET | Freq: Every day | ORAL | 0 refills | Status: DC

## 2021-01-25 MED ORDER — TRAMADOL HCL 50 MG PO TABS: ORAL_TABLET | ORAL | 1 refills | Status: DC

## 2021-01-25 NOTE — Telephone Encounter (Signed)
We received a fax from [facility] asking for an authorization to fill a Meloxicam 7.5 mg. Unless I overlooked, I can not tell where this was ever prescribed.   Okay to send?

## 2021-01-25 NOTE — Patient Instructions (Signed)
Visit Information  PATIENT GOALS: Goals Addressed            This Visit's Progress   . Chronic Care Management Pharmacy Care Plan   On track    Current Barriers:  . Unable to independently afford treatment regimen . Unable to independently monitor therapeutic efficacy . Unable to achieve control of type 2 diabetes and high blood pressure / hypertension . Does not adhere to prescribed medication regimen . Frequent ED visits  Pharmacist Clinical Goal(s):  [name] Kitchen Over the next 45 days, patient will verbalize ability to afford treatment regimen . achieve adherence to monitoring guidelines and medication adherence to achieve therapeutic efficacy . achieve control of type 2 diabetes and high blood pressure / hypertension as evidenced by attainment of goals listed below . Decrease frequency of ED visits through collaboration with PharmD and provider.   Interventions: . 1:1 collaboration with Carollee Herter, Alferd Apa, DO regarding development and update of comprehensive plan of care as evidenced by provider attestation and co-signature . Inter-disciplinary care team collaboration (see longitudinal plan of care) . Comprehensive medication review performed; medication list updated in electronic medical record  Diabetes:  Lab Results  Component Value Date   HGBA1C 9.9 (A) 01/03/2021   . Uncontrolled - (goal A1c <7%) . Was prescribed prednisone dose for back pain; patient reports that BG has been in 200's . Current treatment: o Tresiba 46 units once daily  o Novolog 12 units three times a day with meals (told by pharmacist when he picked up prednisone to increase to 15 units with meals)  o Metformin XR 500mg  - 2 Tablets with Breakfast and 2 tablets with Supper . Interventions / Recommendations o Recommended take medications as directed by Dr [name] o Provided education on how to use [employer] 2 system - Unfortunately patient was unable to download app to his phone (limitations of his  phone). He has reader - Patient taught how to place sensor on back of arm and new sensor placed today - Discussed how to scan sensor and read BG report with arrows that indicate if BG is falling, rising or stable. Discussed how to adjust eating based on if BG is falling, rising or stable.  - Patient advised to avoid vitamin C dose >500mg  per day.  - Plan to follow up with patient in 2 days regarding CGM o Patient has filled [place] and Novolog for about 60 day supply of each. He has completed patient portion of Novo Cares PAP application but did not bring into office today. Will continue to assist with this process.  o Patient was denied LIS but he forgot to bring to appointment today.    Hypertension: BP Readings from Last 3 Encounters:  01/09/21 (!) 156/94  01/05/21 (!) 154/91  01/04/21 (!) 164/85   . Uncontrolled - BP goal <140/90 . Not checking BP at home regularly . Current treatment: o Amlodipine 10mg  daily  o Hydralazine 50mg  every 8 hours o Losartan 100mg  daily  . Interventions:  o Counseled on importance of getting blood pressure to goal to prevent strokes and kidney damage o Working to transition generic maintenance medications over [employer] order pharmacy.  Hyperlipidemia / Heart Disease: Lipid Panel     Component Value Date/Time   CHOL 118 11/28/2020 1502   TRIG 130.0 11/28/2020 1502   HDL 40.00 11/28/2020 1502   CHOLHDL 3 11/28/2020 1502   VLDL 26.0 11/28/2020 1502   LDLCALC 52 11/28/2020 1502   . Controlled - LDL goal <  70 . Current treatment: o Pravastatin 40mg  at bedtime . Interventions:  o Counseled on LDL goals o Recommended continue current regimen for cholesterol  Chronic Pain / Leg Weakness: . Currently not controlled . Patient has had several ED visits for pain and weakness in the last month. Has appointment with pain management 02/16/2021. . Current treatment:  o Tramadol 50mg  - take 1 or 2 tablets up to every 8 hours as needed o Methocarbamol  500mg  - take 1 tablet 4 times a day as needed for muscle spasms o Voltaren Gel 1% -  apply to area of pain up to 4 times a day as needed o Prednisone  . Interventions:  o Continue current regimen for pain  o Encouraged patient to follow up with neurology o Ensure transportation has been set up for pain management appointment 02/16/2021   Patient Goals/Self-Care Activities . Over the next 90 days, patient will:  o take medications as prescribed o focus on medication adherence by utilizing pharmacy service that delivers medications to his home, o check glucose before meals and 2 hour after meals using CGM, document, and provide at future appointments o collaborate with provider on medication access solutions  Follow Up Plan: phone call in 2 days; phone visit in 2 weeks unless needed sooner.       . Monitor and Manage My Blood Sugar   On track    Timeframe:  Long-Range Goal Priority:  Medium Start Date:  12/06/20                           Expected End Date: 06/07/21                      Follow Up Date 01/30/21  -attend provider visit as scheduled-currently scheduled for 01/03/21. -obtain glucose meter - patient received [employer] 2 sensors and reader; in office today for education with clinical pharmacist on how to use CGM -take medications as prescribed. -check blood sugar as recommended by provider -know blood sugar target range and when to call doctor -know target Hemoglobin A1C -enter blood sugar readings and medication or insulin into daily log and take to provider appointments - take the blood sugar meter to all doctor visits once you have begun using - limit fast food meals to no more than 1 per week - schedule appointment with eye doctor -Call [facility] if you have any questions regarding use of calendar/organizer.  -Discuss Nutrition consult with provider at next visit   Why is this important?    Checking your blood sugar at home helps to keep it from getting very high or  very low.   Writing the results in a diary or log helps the doctor know how to care for you.   Your blood sugar log should have the time, date and the results.   Also, write down the amount of insulin or other medicine that you take.   Other information, like what you ate, exercise done and how you were feeling, will also be helpful.     Notes:        The patient verbalized understanding of instructions, educational materials, and care plan provided today and declined offer to receive copy of patient instructions, educational materials, and care plan.   Telephone follow up appointment with care management team member scheduled for: 2 days and 2 weeks  Cherre Robins, PharmD Clinical Pharmacist [facility]

## 2021-01-25 NOTE — Telephone Encounter (Signed)
[facility] also sent fax for Tramadol:  Requesting: Tramadol 50 mg Contract: None UDS: 01/10/2021 at the hospital Last Visit: 01/12/2021 Next Visit: None Last Refill: Rx was just sent on 01/23/2021 to [facility] for #90, 0  As well as a refill on Lotramin Cream- this was d/c in 2021.

## 2021-01-25 NOTE — Chronic Care Management (AMB) (Signed)
Chronic Care Management Pharmacy Note  01/25/2021 Name:  Tyler Brewer MRN:  [id number] DOB:  04-03-1961  Subjective: Tyler Brewer is an 60 y.o. year old male who is a primary patient of [name], DO.  The CCM team was consulted for assistance with disease management and care coordination needs.    Engaged with patient by telephone for initial visit in response to provider referral for pharmacy case management and/or care coordination services.   Consent to Services:  The patient was given the following information about Chronic Care Management services today, agreed to services, and gave verbal consent: 1. CCM service includes personalized support from designated clinical staff supervised by the primary care provider, including individualized plan of care and coordination with other care providers 2. 24/7 contact phone numbers for assistance for urgent and routine care needs. 3. Service will only be billed when office clinical staff spend 20 minutes or more in a month to coordinate care. 4. Only one practitioner may furnish and bill the service in a calendar month. 5.The patient may stop CCM services at any time (effective at the end of the month) by phone call to the office staff. 6. The patient will be responsible for cost sharing (co-pay) of up to 20% of the service fee (after annual deductible is met). Patient agreed to services and consent obtained.  Patient Care Team: Carollee Herter, Alferd Apa, DO as PCP - General (Family Medicine) Alda Berthold, DO as Consulting Physician (Neurology) Lajuana Matte, MD as Consulting Physician (Cardiothoracic Surgery) Marlaine Hind, MD as Consulting Physician (Physical Medicine and Rehabilitation) Luretha Rued, RN as Case Manager Saporito, Maree Erie, LCSW as Social Worker (Licensed Clinical Social Worker) Cherre Robins, PharmD (Pharmacist) [name], Melanie Crazier, MD as Consulting Physician (Endocrinology)  Recent office  visits: 11/28/2020 - PCP (Dr Etter Sjogren) [facility] f/u; c/o LEE; Refilled furosemide 79m take 1 tablet daily and tramadol 584m1 or 2 tablet every 8 hours. Referred to Endo for DM.  Recent consult visits: 01/03/2021 - Endo (Dr ShKelton PillarInitial consult for insulin dependent type 2 DM. Started Metformin 1 tablet daily with Breakfast for 1 week, then increase to 1 tablet with Breakfast and 1 tablet with Supper for  1 week, then increase to 2 tablets with Breakfast  and 1 tablet with Supper for another 1 week , then finally 2 Tablets with Breakfast and 2 tablets with Supper.  Increased Tresiba to 45 units daily. Increase Novolog to 12 units with each meal    Hospital visits: 01/05/2021 - ED Visit - right sided low back pain with sciatica 01/03/2021 - ED Visit - Near syncope and fall 12/10/2020 - ED visit - bilateral leg weakness  11/04/2020 to 11/21/2020- Hospitalization due to frequent falls and progressive bilateral lower extremity weakness since August 2021. Extensive w/u with suspension of CIDP. Started plasmapheresison 11/11/20 with last treatment 11/18/20. Referral to neurology.  Meds Started - hydralazine 5060mvery 8 hours Meds Stopped - naproxen sodium and OTC ibuprofen Meds Changed - diclofenac 1% gel - increased frequency of uese to 4 times daily; Increaesd gabapentin 300m49m take 2 tabs in am; 1 in afternoon and 1 at bedtime; Increased tramadol 50mg57m1 to 2 tablets every 8 hours as needed for pain    Objective:  Lab Results  Component Value Date   CREATININE 0.98 01/20/2021   CREATININE 0.76 01/10/2021   CREATININE 1.08 01/09/2021    Lab Results  Component Value Date   HGBA1C 9.9 (A)  01/03/2021   Last diabetic Eye exam: No results found for: HMDIABEYEEXA  Last diabetic Foot exam: No results found for: HMDIABFOOTEX      Component Value Date/Time   CHOL 118 11/28/2020 1502   TRIG 130.0 11/28/2020 1502   HDL 40.00 11/28/2020 1502   CHOLHDL 3 11/28/2020 1502   VLDL 26.0  11/28/2020 1502   LDLCALC 52 11/28/2020 1502   LDLDIRECT 124.0 05/02/2017 1507    Hepatic Function Latest Ref Rng & Units 01/20/2021 01/10/2021 01/09/2021  Total Protein 6.5 - 8.1 g/dL 6.3(L) 7.0 6.5  Albumin 3.5 - 5.0 g/dL 3.5 3.9 3.5  AST 15 - 41 U/L _0 ALT 0 - 44 U/L _1 Alk Phosphatase 38 - 126 U/L 72 64 71  Total Bilirubin 0.3 - 1.2 mg/dL 0.5 0.4 0.5    Lab Results  Component Value Date/Time   TSH 2.948 11/05/2020 07:18 AM   TSH 2.66 03/10/2020 10:06 AM   TSH 1.876 11/04/2019 10:02 AM   TSH 2.89 04/25/2016 07:57 AM    CBC Latest Ref Rng & Units 01/20/2021 01/10/2021 01/09/2021  WBC 4.0 - 10.5 K/uL 7.3 6.7 6.3  Hemoglobin 13.0 - 17.0 g/dL 13.1 14.1 13.5  Hematocrit 39.0 - 52.0 % 38.7(L) 40.9 39.0  Platelets 150 - 400 K/uL 277 243 252    No results found for: VD25OH  Clinical ASCVD: Yes  The ASCVD Risk score Mikey Bussing DC Jr., et al., 2013) failed to calculate for the following reasons:   The valid total cholesterol range is 130 to 320 mg/dL     Social History   Tobacco Use  Smoking Status Current Every Day Smoker  . Packs/day: 0.50  . Types: Cigarettes  . Last attempt to quit: 09/29/2020  . Years since quitting: 0.3  Smokeless Tobacco Never Used   BP Readings from Last 3 Encounters:  01/23/21 (!) 157/99  01/21/21 (!) 192/118  01/19/21 107/66   Pulse Readings from Last 3 Encounters:  01/23/21 97  01/21/21 80  01/19/21 68   Wt Readings from Last 3 Encounters:  01/23/21 275 lb (124.7 kg)  01/19/21 278 lb (126.1 kg)  01/16/21 279 lb 15.8 oz (127 kg)    Assessment: Review of patient past medical history, allergies, medications, health status, including review of consultants reports, laboratory and other test data, was performed as part of comprehensive evaluation and provision of chronic care management services.   SDOH:  (Social Determinants of Health) assessments and interventions performed:    CCM Care Plan  No Known Allergies  Medications  Reviewed Today    Reviewed by Cherre Robins, PharmD (Pharmacist) on 01/20/21 at 31  Med List Status: <None>  Medication Order Taking? Sig Documenting Provider Last Dose Status Informant  acetaminophen (TYLENOL) 500 MG tablet [id number] Yes Take 500 mg by mouth every 6 (six) hours as needed. States he is taking one to two tablets a day [provider] Taking Active Self  amLODipine (NORVASC) 10 MG tablet [id number] Yes Take 1 tablet (10 mg total) by mouth daily. [name] R, DO Taking Active   diclofenac Sodium (VOLTAREN) 1 % GEL [id number] No Apply 2 g topically 4 (four) times daily.  Patient not taking: Reported on 01/20/2021   Corena Herter, PA-C Not Taking Active   DULoxetine (CYMBALTA) 30 MG capsule [id number] Yes Take 1 capsule (30 mg total) by mouth daily.  Patient taking differently: Take 30 mg by mouth 3 (three) times a week.   Carollee Herter,  Alferd Apa, DO Taking Active   furosemide (LASIX) 20 MG tablet [id number] Yes TAKE 1 TABLET(20 MG) BY MOUTH DAILY Carollee Herter, Alferd Apa, DO Taking Active   gabapentin (NEURONTIN) 300 MG capsule [id number] Yes Take 2 tablets in the morning, 1 tablet in the afternoon, and 1 tablet at bedtime.  Patient taking differently: Take 300-600 mg by mouth See admin instructions. Take 600 mg in the morning, 300 mg in the afternoon and at bedtime   Narda Amber K, DO Taking Active   hydrALAZINE (APRESOLINE) 50 MG tablet [id number] Yes Take 1 tablet (50 mg total) by mouth every 8 (eight) hours. Thurnell Lose, MD Taking Active   ibuprofen (ADVIL) 200 MG tablet [id number] Yes Take 200 mg by mouth every 6 (six) hours as needed. [provider] Taking Active   insulin aspart (NOVOLOG FLEXPEN) 100 UNIT/ML FlexPen [id number] Yes Inject 12 Units into the skin 3 (three) times daily with meals. [name], Melanie Crazier, MD Taking Active   insulin degludec (TRESIBA FLEXTOUCH) 200 UNIT/ML FlexTouch Pen [id number] Yes Inject 46 Units into the skin daily.  [name], Melanie Crazier, MD Taking Active   Insulin Pen Needle 31G X 5 MM MISC [id number] Yes 1 Device by Does not apply route in the morning, at noon, in the evening, and at bedtime. [name], Melanie Crazier, MD Taking Active   losartan (COZAAR) 100 MG tablet [id number] Yes Take 1 tablet (100 mg total) by mouth daily. [name], DO Taking Active Self  Menthol, Topical Analgesic, (ICY HOT EX) [id number] No Apply topically as needed.  Patient not taking: Reported on 01/20/2021   [provider] Not Taking Active Self  metFORMIN (GLUCOPHAGE-XR) 500 MG 24 hr tablet [id number] Yes Take 2 tablets (1,000 mg total) by mouth in the morning and at bedtime. [name], Melanie Crazier, MD Taking Active            Med Note Zenia Resides, [facility] A   Thu Jan 19, 2021 10:14 AM) Per patient he has picked up medication and is taking it  methocarbamol (ROBAXIN) 500 MG tablet [id number] Yes TAKE ONE TABLET BY MOUTH FOUR TIMES A DAY [name] R, DO Taking Active   methylPREDNISolone (MEDROL DOSEPAK) 4 MG TBPK tablet [id number] Yes Use as directed Wyvonnia Dusky, MD Taking Active   pravastatin (PRAVACHOL) 40 MG tablet [id number] Yes TAKE ONE TABLET BY MOUTH ONE TIME DAILY [name], DO Taking Active   tamsulosin (FLOMAX) 0.4 MG CAPS capsule [id number] Yes Take 1 capsule (0.4 mg total) by mouth daily. [name] R, DO Taking Active Self  traMADol (ULTRAM) 50 MG tablet [id number] Yes TAKE ONE TO TWO TABLETS BY MOUTH EVERY 8 HOURS AS NEEDED FOR PAIN [name], DO Taking Active           Patient Active Problem List   Diagnosis Date Noted  . CIDP (chronic inflammatory demyelinating polyneuropathy) ([place]) 01/12/2021  . Diabetes mellitus ([place]) 01/03/2021  . Type 2 diabetes mellitus with hyperglycemia, with long-term current use of insulin ([place]) 01/03/2021  . Lower extremity edema 11/28/2020  . Chronic low back pain 11/28/2020  . Psoriatic arthritis ([place])  11/28/2020  . Thoracic aortic aneurysm without rupture ([place]) 11/28/2020  . Bilateral leg weakness 11/05/2020  . Acute left-sided low back pain with right-sided sciatica 07/19/2020  . Hip pain, acute, left 07/05/2020  . Tremor 07/05/2020  . Weakness 07/05/2020  . Balance problem 03/11/2020  . Tinea cruris 03/11/2020  . Cellulitis of left  groin 03/11/2020  . Acute pain of right shoulder 03/11/2020  . Interstitial lung disease ([place]) 05/02/2017  . Pansinusitis 09/26/2016  . Diabetic polyneuropathy associated with diabetes mellitus due to underlying condition ([place]) 05/08/2016  . Methamphetamine use disorder, severe, dependence ([place]) 02/11/2016  . Substance induced mood disorder ([place]) 02/11/2016  . Exertional dyspnea 11/30/2015  . ADD (attention deficit disorder) 09/29/2015  . Binge eating 09/29/2015  . Syncope 05/05/2012  . Diarrhea 05/05/2012  . Panic attacks 05/05/2012  . OSA (obstructive sleep apnea) 05/05/2012  . HTN (hypertension) 05/05/2012  . Dyslipidemia 05/05/2012  . CAD (coronary artery disease)  cardiac cath with moderate disease in a septal branch of the ramus intermedius 04/01/2012  . Chest pain 04/01/2012  . Drug abuse and dependence ([place]) 04/01/2012  . Family history of coronary artery disease 03/31/2012  . Sleep apnea, on C-pap 03/31/2012  . Hyperlipemia 03/30/2012  . HTN (hypertension), benign 03/30/2012  . Morbid obesity ([place]) 03/30/2012  . DM type 2, uncontrolled, with neuropathy ([place]) 03/30/2012  . Depression with suicidal ideation 03/30/2012    Immunization History  Administered Date(s) Administered  . Tdap 08/20/2014    Conditions to be addressed/monitored: CAD, HTN, HLD, DMII and interstitial lung disease; OSA; chronic low back pain  There are no care plans that you recently modified to display for this patient.   Medication Assistance: patient states he is having difficulty with cost of Tresiba and Novolog insulin.   Assisted patient in applying for  LIS at last visit. Patient informed me today that he received letter of denial for LIS.   At [facility] the cost of Tyler Aas U200 is $141 -for the 3 boxes written for which is a 78 days supply so insurance is charging 3 - $47 copays. If patient requests just 1 box at current dose it would be a 39 days supply and he would pay $94 or 2 months copay.   Novolog was for 38m or 2 boxes and this was 83 day supply so cost to patient would be $141. If he got 1 box it would be 41 days supply and cost would decreased to $94.   Started process of applying for patient assistance through [employer]   Will continue to coordinate with Dr SKelton Pillar patient and pharmacy to find lowest cost for insulins.    [id number] [place] [place] - [address] [place] [id number]: [phone]: [phone] [facility] [address] [address] [place] [address]Phone: [phone]: [phone]  Follow Up:  Patient agrees to Care Plan and Follow-up.  Plan: Face to Face appointment with care management team member scheduled for: 01/25/2021  TCherre Robins PharmD Clinical Pharmacist [phone]

## 2021-01-25 NOTE — Telephone Encounter (Signed)
Patient would like the following medications sent to [facility] mail order for 90 day supply:  Losartan, pravastatin, tamsulosin, amlodipine, hydralazine, metformin 500mg  ER.

## 2021-01-25 NOTE — Patient Instructions (Signed)
Visit Information  PATIENT GOALS: Goals Addressed            This Visit's Progress   . Chronic Care Management Pharmacy Care Plan   Not on track    Current Barriers:  . Unable to independently afford treatment regimen . Unable to independently monitor therapeutic efficacy . Unable to achieve control of type 2 diabetes and high blood pressure / hypertension . Does not adhere to prescribed medication regimen . Frequent ED visits  Pharmacist Clinical Goal(s):  [name] Kitchen Over the next 45 days, patient will verbalize ability to afford treatment regimen . achieve adherence to monitoring guidelines and medication adherence to achieve therapeutic efficacy . achieve control of type 2 diabetes and high blood pressure / hypertension as evidenced by attainment of goals listed below . Decrease frequency of ED visits through collaboration with PharmD and provider.   Interventions: . 1:1 collaboration with Carollee Herter, Alferd Apa, DO regarding development and update of comprehensive plan of care as evidenced by provider attestation and co-signature . Inter-disciplinary care team collaboration (see longitudinal plan of care) . Comprehensive medication review performed; medication list updated in electronic medical record  Diabetes:  Lab Results  Component Value Date   HGBA1C 9.9 (A) 01/03/2021   . Uncontrolled - (goal A1c <7%) . Was prescribed prednisone dose pack by ED; could increase BG numbers . Current treatment: o Tresiba 46 units once daily  o Novolog 12 units three times a day with meals o Metformin XR 500mg  - Currently taking 2 tablets with Breakfast  and 1 tablet with Supper for another 1 week. Next week will increase to take  2 Tablets with Breakfast and 2 tablets with Supper . Interventions / Recommendations o Recommended take medications as directed by Dr [name] o Patient has received Freestyle Libre 2 CGM (DexCom cost was too high). He will come in next week for face to face visit  for CGM education and to start [facility].  o Patient has filled [place] and Novolog for about 60 day supple of each. He has completed patient portion of Novo Cares PAP application and will bring to appointment next week. o Patient was denied LIS and will bring in copy of letter to appointment next week.    Hypertension: BP Readings from Last 3 Encounters:  01/09/21 (!) 156/94  01/05/21 (!) 154/91  01/04/21 (!) 164/85   . Uncontrolled - BP goal <140/90 . Not checking BP at home regularly . Current treatment: o Amlodipine 10mg  daily  o Hydralazine 50mg  every 8 hours o Losartan 100mg  daily  . Interventions:  o Counseled on importance of getting blood pressure to goal to prevent strokes and kidney damage o Working to transition generic maintenance medications over [employer] order pharmacy.  Hyperlipidemia / Heart Disease: Lipid Panel     Component Value Date/Time   CHOL 118 11/28/2020 1502   TRIG 130.0 11/28/2020 1502   HDL 40.00 11/28/2020 1502   CHOLHDL 3 11/28/2020 1502   VLDL 26.0 11/28/2020 1502   LDLCALC 52 11/28/2020 1502   . Controlled - LDL goal <70 . Current treatment: o Pravastatin 40mg  at bedtime . Interventions:  o Counseled on LDL goals o Recommended continue current regimen for cholesterol  Chronic Pain / Leg Weakness: . Currently not controlled . Patient has had several ED visits for pain and weakness in the last month. Has appointment with pain management 02/16/2021. . Current treatment:  o Tramadol 50mg  - take 1 or 2 tablets up to every 8 hours  as needed o Methocarbamol 500mg  - take 1 tablet 4 times a day as needed for muscle spasms o Voltaren Gel 1% -  apply to area of pain up to 4 times a day as needed o Prednisone dose pack  . Interventions:  o Continue current regimen for pain  o Encouraged patient to follow up with neurology o Ensure transportation has been set up for pain management appointment 02/16/2021   Patient Goals/Self-Care  Activities . Over the next 90 days, patient will:  o take medications as prescribed o focus on medication adherence by utilizing pharmacy service that delivers medications to his home, o check glucose 4 times daily or start to the CGM, document, and provide at future appointments o collaborate with provider on medication access solutions  Follow Up Plan: face to face appointment in 1 week for CGM education and initiation          The patient verbalized understanding of instructions, educational materials, and care plan provided today and declined offer to receive copy of patient instructions, educational materials, and care plan.   Face to Face appointment with care management team member scheduled for: 01/25/2021  Cherre Robins, PharmD Clinical Pharmacist [place] [facility] [address]

## 2021-01-25 NOTE — Chronic Care Management (AMB) (Signed)
Chronic Care Management Pharmacy Note  01/25/2021 Name:  Tyler Brewer MRN:  [id number] DOB:  23-Apr-1961  Subjective: Tyler Brewer is an 60 y.o. year old male who is a primary patient of [name], DO.  The CCM team was consulted for assistance with disease management and care coordination needs.    Engaged with patient face to face for follow up visit in response to provider referral for pharmacy case management and/or care coordination services.   Consent to Services:  The patient was given information about Chronic Care Management services, agreed to services, and gave verbal consent prior to initiation of services.  Please see initial visit note for detailed documentation.   Patient Care Team: Carollee Herter, Alferd Apa, DO as PCP - General (Family Medicine) Alda Berthold, DO as Consulting Physician (Neurology) Lajuana Matte, MD as Consulting Physician (Cardiothoracic Surgery) Marlaine Hind, MD as Consulting Physician (Physical Medicine and Rehabilitation) Luretha Rued, RN as Case Manager Saporito, Maree Erie, LCSW as Social Worker (Licensed Clinical Social Worker) Cherre Robins, PharmD (Pharmacist) [name], Melanie Crazier, MD as Consulting Physician (Endocrinology)  Recent office visits: 01/12/2021 - PCP (Dr Etter Sjogren) F/U visit; recommended COVID vaccine and colonoscopy; no medication changes.  11/28/2020 - PCP (Dr Etter Sjogren) Hospital f/u; c/o LEE; Refilled furosemide 27m take 1 tablet daily and tramadol 534m1 or 2 tablet every 8 hours. Referred to Endo for DM.  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 No medication changes. Recommended optimize control of DM and physical therapy to improve strength.   01/03/2021 - Endo (Dr ShKelton PillarInitial consult for insulin dependent type 2 DM. Started Metformin 1 tablet daily with Breakfast for 1 week, then increase to 1 tablet with  Breakfast and 1 tablet with Supper for  1 week, then increase to 2 tablets with Breakfast  and 1 tablet with Supper for another 1 week , then finally 2 Tablets with Breakfast and 2 tablets with Supper.  Increased Tresiba to 45 units daily. Increase Novolog to 12 units with each meal    Hospital visits: 01/23/2021 - ED Visit - acute pain in left knee given oxycodone / APAP for 1 dose and IM toradol 01/21/2021 - Ed Visit - left without being seen (MGershon Musselone) 01/20/2021 - ED Visit - abdominal pain; GIven GI cocktail with improved symptoms. No change to medication regimen 01/16/2021 - ED Visit - leg pain; Given ketorolac 3029mM; oxycodone 51m21m1 dose; prednisone 60mg52m dose and Rx to start methylprednisolone dose pack   Objective:  Lab Results  Component Value Date   CREATININE 0.98 01/20/2021   CREATININE 0.76 01/10/2021   CREATININE 1.08 01/09/2021    Lab Results  Component Value Date   HGBA1C 9.9 (A) 01/03/2021   Last diabetic Eye exam: No results found for: HMDIABEYEEXA  Last diabetic Foot exam: No results found for: HMDIABFOOTEX      Component Value Date/Time   CHOL 118 11/28/2020 1502   TRIG 130.0 11/28/2020 1502   HDL 40.00 11/28/2020 1502   CHOLHDL 3 11/28/2020 1502   VLDL 26.0 11/28/2020 1502   LDLCALC 52 11/28/2020 1502   LDLDIRECT 124.0 05/02/2017 1507    Hepatic Function Latest Ref Rng & Units 01/20/2021 01/10/2021 01/09/2021  Total Protein 6.5 - 8.1 g/dL 6.3(L) 7.0 6.5  Albumin 3.5 - 5.0 g/dL 3.5 3.9 3.5  AST 15 - 41 U/L '16 15 15  ' ALT 0 - 44 U/L  '18 20 18  ' Alk Phosphatase 38 - 126 U/L 72 64 71  Total Bilirubin 0.3 - 1.2 mg/dL 0.5 0.4 0.5    Lab Results  Component Value Date/Time   TSH 2.948 11/05/2020 07:18 AM   TSH 2.66 03/10/2020 10:06 AM   TSH 1.876 11/04/2019 10:02 AM   TSH 2.89 04/25/2016 07:57 AM    CBC Latest Ref Rng & Units 01/20/2021 01/10/2021 01/09/2021  WBC 4.0 - 10.5 K/uL 7.3 6.7 6.3  Hemoglobin 13.0 - 17.0 g/dL 13.1 14.1 13.5  Hematocrit  39.0 - 52.0 % 38.7(L) 40.9 39.0  Platelets 150 - 400 K/uL 277 243 252    No results found for: VD25OH  Clinical ASCVD: Yes  The ASCVD Risk score Mikey Bussing DC Jr., et al., 2013) failed to calculate for the following reasons:   The valid total cholesterol range is 130 to 320 mg/dL     Social History   Tobacco Use  Smoking Status Current Every Day Smoker  . Packs/day: 0.50  . Types: Cigarettes  . Last attempt to quit: 09/29/2020  . Years since quitting: 0.3  Smokeless Tobacco Never Used   BP Readings from Last 3 Encounters:  01/23/21 (!) 157/99  01/21/21 (!) 192/118  01/19/21 107/66   Pulse Readings from Last 3 Encounters:  01/23/21 97  01/21/21 80  01/19/21 68   Wt Readings from Last 3 Encounters:  01/23/21 275 lb (124.7 kg)  01/19/21 278 lb (126.1 kg)  01/16/21 279 lb 15.8 oz (127 kg)    Assessment: Review of patient past medical history, allergies, medications, health status, including review of consultants reports, laboratory and other test data, was performed as part of comprehensive evaluation and provision of chronic care management services.   SDOH:  (Social Determinants of Health) assessments and interventions performed:    CCM Care Plan  No Known Allergies  Medications Reviewed Today    Reviewed by Cherre Robins, PharmD (Pharmacist) on 01/25/21 at 1545  Med List Status: <None>  Medication Order Taking? Sig Documenting Provider Last Dose Status Informant  acetaminophen (TYLENOL) 500 MG tablet [id number] Yes Take 500 mg by mouth every 6 (six) hours as needed. States he is taking one to two tablets a day [provider] Taking Active Self  amLODipine (NORVASC) 10 MG tablet [id number] Yes Take 1 tablet (10 mg total) by mouth daily. [name] R, DO Taking Active   DULoxetine (CYMBALTA) 30 MG capsule [id number] Yes Take 1 capsule (30 mg total) by mouth daily.  Patient taking differently: Take 30 mg by mouth 3 (three) times a week.   [name]  R, DO Taking Active   furosemide (LASIX) 20 MG tablet [id number] Yes TAKE 1 TABLET(20 MG) BY MOUTH DAILY Carollee Herter, Alferd Apa, DO Taking Active   gabapentin (NEURONTIN) 300 MG capsule [id number] Yes Take 2 tablets tid Carollee Herter, Alferd Apa, DO Taking Active   hydrALAZINE (APRESOLINE) 50 MG tablet [id number] Yes Take 1 tablet (50 mg total) by mouth every 8 (eight) hours. Thurnell Lose, MD Taking Active   ibuprofen (ADVIL) 200 MG tablet [id number] Yes Take 200 mg by mouth every 6 (six) hours as needed. [provider] Taking Active   insulin aspart (NOVOLOG FLEXPEN) 100 UNIT/ML FlexPen [id number] Yes Inject 12 Units into the skin 3 (three) times daily with meals. [name], Melanie Crazier, MD Taking Active            Med Note Sunnie Nielsen Jan 25, 2021  3:43  PM) Patient states was told to take 15 units prior to meals by pharmacist when he picked up prednisone.   insulin degludec (TRESIBA FLEXTOUCH) 200 UNIT/ML FlexTouch Pen [id number] Yes Inject 46 Units into the skin daily. [name], Melanie Crazier, MD Taking Active   Insulin Pen Needle 31G X 5 MM MISC [id number] Yes 1 Device by Does not apply route in the morning, at noon, in the evening, and at bedtime. [name], Melanie Crazier, MD Taking Active   losartan (COZAAR) 100 MG tablet [id number] Yes Take 1 tablet (100 mg total) by mouth daily. [name], DO Taking Active Self  meloxicam (MOBIC) 7.5 MG tablet [id number] Yes Take 1 tablet (7.5 mg total) by mouth daily. [name], DO Taking Active   Menthol, Topical Analgesic, (ICY HOT EX) [id number] Yes Apply topically as needed. [provider] Taking Active   metFORMIN (GLUCOPHAGE-XR) 500 MG 24 hr tablet [id number] Yes Take 2 tablets (1,000 mg total) by mouth in the morning and at bedtime. [name], Melanie Crazier, MD Taking Active            Med Note Sunnie Nielsen Jan 25, 2021  3:44 PM)    methocarbamol (ROBAXIN) 500 MG tablet [id number] Yes  TAKE ONE TABLET BY MOUTH FOUR TIMES A DAY [name], DO Taking Active   pravastatin (PRAVACHOL) 40 MG tablet [id number] Yes TAKE ONE TABLET BY MOUTH ONE TIME DAILY [name], DO Taking Active   predniSONE (DELTASONE) 10 MG tablet [id number] Yes TAKE 3 TABLETS PO QD FOR 3 DAYS THEN TAKE 2 TABLETS PO QD FOR 3 DAYS THEN TAKE 1 TABLET PO QD FOR 3 DAYS THEN TAKE 1/2 TAB PO QD FOR 3 DAYS [name], DO Taking Active   tamsulosin (FLOMAX) 0.4 MG CAPS capsule [id number] Yes Take 1 capsule (0.4 mg total) by mouth daily. [name] R, DO Taking Active Self  traMADol (ULTRAM) 50 MG tablet [id number] Yes TAKE ONE TO TWO TABLETS BY MOUTH EVERY 6 HOURS AS NEEDED FOR PAIN [name], DO Taking Active           Patient Active Problem List   Diagnosis Date Noted  . CIDP (chronic inflammatory demyelinating polyneuropathy) ([place]) 01/12/2021  . Diabetes mellitus ([place]) 01/03/2021  . Type 2 diabetes mellitus with hyperglycemia, with long-term current use of insulin ([place]) 01/03/2021  . Lower extremity edema 11/28/2020  . Chronic low back pain 11/28/2020  . Psoriatic arthritis ([place]) 11/28/2020  . Thoracic aortic aneurysm without rupture ([place]) 11/28/2020  . Bilateral leg weakness 11/05/2020  . Acute left-sided low back pain with right-sided sciatica 07/19/2020  . Hip pain, acute, left 07/05/2020  . Tremor 07/05/2020  . Weakness 07/05/2020  . Balance problem 03/11/2020  . Tinea cruris 03/11/2020  . Cellulitis of left groin 03/11/2020  . Acute pain of right shoulder 03/11/2020  . Interstitial lung disease ([place]) 05/02/2017  . Pansinusitis 09/26/2016  . Diabetic polyneuropathy associated with diabetes mellitus due to underlying condition ([place]) 05/08/2016  . Methamphetamine use disorder, severe, dependence ([place]) 02/11/2016  . Substance induced mood disorder ([place]) 02/11/2016  . Exertional dyspnea 11/30/2015  . ADD (attention deficit disorder) 09/29/2015  . Binge  eating 09/29/2015  . Syncope 05/05/2012  . Diarrhea 05/05/2012  . Panic attacks 05/05/2012  . OSA (obstructive sleep apnea) 05/05/2012  . HTN (hypertension) 05/05/2012  . Dyslipidemia 05/05/2012  . CAD (coronary artery disease)  cardiac cath with moderate disease in  a septal branch of the ramus intermedius 04/01/2012  . Chest pain 04/01/2012  . Drug abuse and dependence ([place]) 04/01/2012  . Family history of coronary artery disease 03/31/2012  . Sleep apnea, on C-pap 03/31/2012  . Hyperlipemia 03/30/2012  . HTN (hypertension), benign 03/30/2012  . Morbid obesity ([place]) 03/30/2012  . DM type 2, uncontrolled, with neuropathy ([place]) 03/30/2012  . Depression with suicidal ideation 03/30/2012    Immunization History  Administered Date(s) Administered  . Tdap 08/20/2014    Conditions to be addressed/monitored: CAD, HTN, HLD, DMII and chronic pain (low back) interstitial lung disease; OSA  Care Plan : General Pharmacy (Adult)  Updates made by Cherre Robins, PHARMD since 01/25/2021 12:00 AM    Problem: Medication management and Chronic Care Management, support and education for the following conditions - HTN, Type2 DM with long term insuiln use; hyperlipidemia; neuropathy; OSA; institial lung disease;   Priority: High  Onset Date: 01/06/2021  Note:   Current Barriers:  . Unable to independently afford treatment regimen . Unable to independently monitor therapeutic efficacy . Unable to achieve control of type 2 DM and HTN  . Does not adhere to prescribed medication regimen . Frequent ED visits  Pharmacist Clinical Goal(s):  [name] Kitchen Over the next 45 days, patient will verbalize ability to afford treatment regimen . achieve adherence to monitoring guidelines and medication adherence to achieve therapeutic efficacy . achieve control of type 2 DM and HTN as evidenced by attainment of goals listed below . Decrease frequency of ED visits  through collaboration with PharmD and provider.    Interventions: . 1:1 collaboration with Carollee Herter, Alferd Apa, DO regarding development and update of comprehensive plan of care as evidenced by provider attestation and co-signature . Inter-disciplinary care team collaboration (see longitudinal plan of care) . Comprehensive medication review performed; medication list updated in electronic medical record   Interventions: . 1:1 collaboration with Carollee Herter, Alferd Apa, DO regarding development and update of comprehensive plan of care as evidenced by provider attestation and co-signature . Inter-disciplinary care team collaboration (see longitudinal plan of care) . Comprehensive medication review performed; medication list updated in electronic medical record  Diabetes:  Lab Results  Component Value Date   HGBA1C 9.9 (A) 01/03/2021   . Uncontrolled - (goal A1c <7%) . Was prescribed prednisone dose for back pain; patient reports that BG has been in 200's . Current treatment: o Tresiba 46 units once daily  o Novolog 12 units three times a day with meals (told by pharmacist when he picked up prednisone to increase to 15 units with meals)  o Metformin XR 533m - 2 Tablets with Breakfast and 2 tablets with Supper . Interventions / Recommendations o Recommended take medications as directed by Dr SKelton Pillaro Provided education on how to use [employer]2 system - Unfortunately patient was unable to download app to his phone (limitations of his phone). He has reader - Patient taught how to place sensor on back of arm and new sensor placed today - Discussed how to scan sensor and read BG report with arrows that indicate if BG is falling, rising or stable. Discussed how to adjust eating based on if BG is falling, rising or stable.  - Patient advised to avoid vitamin C dose >5050mper day.  - Plan to follow up with patient in 2 days regarding CGM o Patient has filled [place] and [place] Novolog for about 60 day supply of each. He has completed  patient portion of Novo Cares PAP  application but did not bring into office today. Will continue to assist with this process.  o Patient was denied LIS but he forgot to bring to appointment today.    Hypertension: BP Readings from Last 3 Encounters:  01/09/21 (!) 156/94  01/05/21 (!) 154/91  01/04/21 (!) 164/85   . Uncontrolled - BP goal <140/90 . Not checking BP at home regularly . Current treatment: o Amlodipine 90m daily  o Hydralazine 533mevery 8 hours o Losartan 10047maily  . Interventions:  o Counseled on importance of getting blood pressure to goal to prevent strokes and kidney damage o Working to transition generic maintenance medications [facility].  Hyperlipidemia / Heart Disease: Lipid Panel     Component Value Date/Time   CHOL 118 11/28/2020 1502   TRIG 130.0 11/28/2020 1502   HDL 40.00 11/28/2020 1502   CHOLHDL 3 11/28/2020 1502   VLDL 26.0 11/28/2020 1502   LDLCALC 52 11/28/2020 1502   . Controlled - LDL goal <70 . Current treatment: o Pravastatin 17m60m bedtime . Interventions:  o Counseled on LDL goals o Recommended continue current regimen for cholesterol  Chronic Pain / Leg Weakness: . Currently not controlled . Patient has had several ED visits for pain and weakness in the last month. Has appointment with pain management 02/16/2021. . Current treatment:  o Tramadol 50mg83make 1 or 2 tablets up to every 8 hours as needed o Methocarbamol 500mg 78mke 1 tablet 4 times a day as needed for muscle spasms o Voltaren Gel 1% -  apply to area of pain up to 4 times a day as needed o Prednisone  . Interventions:  o Continue current regimen for pain  o Encouraged patient to follow up with neurology o Ensure transportation has been set up for pain management appointment 02/16/2021   Patient Goals/Self-Care Activities . Over the next 90 days, patient will:  o take medications as prescribed o focus on medication adherence by utilizing  pharmacy service that delivers medications to his home, o check glucose before meals and 2 hour after meals using CGM, document, and provide at future appointments o collaborate with provider on medication access solutions   Follow up call planned in 2 days to check on BG; also will follow up in 2 weeks.      Medication Assistance: Application for Novolog and Tresiba  medication assistance program. in process.  Anticipated assistance start date 03/20/2021.  See plan of care for additional detail.  [id number] [place] - [address] [place] [id number]: [phone] [id number] [place] [id number]: [phone] Up:  Patient agrees to Care Plan and Follow-up.  Plan: The care management team will reach out to the patient again over the next 2 days and again in 2 weeks.   Texanna Hilburn Cherre RobinsmD Clinical Pharmacist [phone]

## 2021-01-25 NOTE — Telephone Encounter (Signed)
Yes-- I sent them both in--- he needed more ultram because we increased to 2 every 6 hours

## 2021-01-26 ENCOUNTER — Encounter: Payer: Self-pay | Admitting: Family Medicine

## 2021-01-26 ENCOUNTER — Other Ambulatory Visit: Payer: Self-pay | Admitting: Family Medicine

## 2021-01-26 ENCOUNTER — Other Ambulatory Visit: Payer: Self-pay

## 2021-01-26 DIAGNOSIS — N138 Other obstructive and reflux uropathy: Secondary | ICD-10-CM

## 2021-01-26 DIAGNOSIS — I10 Essential (primary) hypertension: Secondary | ICD-10-CM

## 2021-01-26 DIAGNOSIS — N401 Enlarged prostate with lower urinary tract symptoms: Secondary | ICD-10-CM

## 2021-01-26 DIAGNOSIS — R531 Weakness: Secondary | ICD-10-CM

## 2021-01-26 DIAGNOSIS — E1169 Type 2 diabetes mellitus with other specified complication: Secondary | ICD-10-CM

## 2021-01-26 DIAGNOSIS — E785 Hyperlipidemia, unspecified: Secondary | ICD-10-CM

## 2021-01-26 MED ORDER — TAMSULOSIN HCL 0.4 MG PO CAPS: 0.4000 mg | ORAL_CAPSULE | Freq: Every day | ORAL | 3 refills | Status: DC

## 2021-01-26 MED ORDER — LOSARTAN POTASSIUM 100 MG PO TABS: 100.0000 mg | ORAL_TABLET | Freq: Every day | ORAL | 3 refills | Status: DC

## 2021-01-26 MED ORDER — AMLODIPINE BESYLATE 10 MG PO TABS: 10.0000 mg | ORAL_TABLET | Freq: Every day | ORAL | 1 refills | Status: DC

## 2021-01-26 MED ORDER — HYDRALAZINE HCL 50 MG PO TABS: 50.0000 mg | ORAL_TABLET | Freq: Three times a day (TID) | ORAL | 0 refills | Status: DC

## 2021-01-26 MED ORDER — PRAVASTATIN SODIUM 40 MG PO TABS: 40.0000 mg | ORAL_TABLET | Freq: Every day | ORAL | 1 refills | Status: DC

## 2021-01-26 MED ORDER — METFORMIN HCL ER 500 MG PO TB24: 1000.0000 mg | ORAL_TABLET | Freq: Two times a day (BID) | ORAL | 1 refills | Status: DC

## 2021-01-26 NOTE — Telephone Encounter (Signed)
Refills sent

## 2021-01-27 ENCOUNTER — Emergency Department (HOSPITAL_BASED_OUTPATIENT_CLINIC_OR_DEPARTMENT_OTHER): Payer: Medicare HMO

## 2021-01-27 ENCOUNTER — Encounter (HOSPITAL_BASED_OUTPATIENT_CLINIC_OR_DEPARTMENT_OTHER): Payer: Self-pay | Admitting: Emergency Medicine

## 2021-01-27 ENCOUNTER — Emergency Department (HOSPITAL_BASED_OUTPATIENT_CLINIC_OR_DEPARTMENT_OTHER)
Admission: EM | Admit: 2021-01-27 | Discharge: 2021-01-27 | Disposition: A | Discharge status: Home / Self Care | Payer: Medicare HMO | Attending: Emergency Medicine | Admitting: Emergency Medicine

## 2021-01-27 ENCOUNTER — Other Ambulatory Visit: Payer: Self-pay

## 2021-01-27 ENCOUNTER — Ambulatory Visit: Payer: Medicare HMO | Admitting: *Deleted

## 2021-01-27 DIAGNOSIS — Z794 Long term (current) use of insulin: Secondary | ICD-10-CM

## 2021-01-27 DIAGNOSIS — G8929 Other chronic pain: Secondary | ICD-10-CM

## 2021-01-27 DIAGNOSIS — M25551 Pain in right hip: Secondary | ICD-10-CM | POA: Diagnosis not present

## 2021-01-27 DIAGNOSIS — M545 Low back pain, unspecified: Secondary | ICD-10-CM

## 2021-01-27 DIAGNOSIS — S90121A Contusion of right lesser toe(s) without damage to nail, initial encounter: Secondary | ICD-10-CM | POA: Diagnosis not present

## 2021-01-27 DIAGNOSIS — R531 Weakness: Secondary | ICD-10-CM

## 2021-01-27 DIAGNOSIS — F17210 Nicotine dependence, cigarettes, uncomplicated: Secondary | ICD-10-CM | POA: Insufficient documentation

## 2021-01-27 DIAGNOSIS — M79674 Pain in right toe(s): Secondary | ICD-10-CM | POA: Insufficient documentation

## 2021-01-27 DIAGNOSIS — Z79899 Other long term (current) drug therapy: Secondary | ICD-10-CM | POA: Diagnosis not present

## 2021-01-27 DIAGNOSIS — M25572 Pain in left ankle and joints of left foot: Secondary | ICD-10-CM | POA: Diagnosis not present

## 2021-01-27 DIAGNOSIS — I10 Essential (primary) hypertension: Secondary | ICD-10-CM | POA: Insufficient documentation

## 2021-01-27 DIAGNOSIS — W182XXA Fall in (into) shower or empty bathtub, initial encounter: Secondary | ICD-10-CM | POA: Insufficient documentation

## 2021-01-27 DIAGNOSIS — R0609 Other forms of dyspnea: Secondary | ICD-10-CM

## 2021-01-27 DIAGNOSIS — R2689 Other abnormalities of gait and mobility: Secondary | ICD-10-CM

## 2021-01-27 DIAGNOSIS — W19XXXA Unspecified fall, initial encounter: Secondary | ICD-10-CM | POA: Diagnosis not present

## 2021-01-27 DIAGNOSIS — E1142 Type 2 diabetes mellitus with diabetic polyneuropathy: Secondary | ICD-10-CM | POA: Diagnosis not present

## 2021-01-27 DIAGNOSIS — E1165 Type 2 diabetes mellitus with hyperglycemia: Secondary | ICD-10-CM

## 2021-01-27 DIAGNOSIS — Z743 Need for continuous supervision: Secondary | ICD-10-CM | POA: Diagnosis not present

## 2021-01-27 DIAGNOSIS — R69 Illness, unspecified: Secondary | ICD-10-CM | POA: Diagnosis not present

## 2021-01-27 DIAGNOSIS — I2510 Atherosclerotic heart disease of native coronary artery without angina pectoris: Secondary | ICD-10-CM | POA: Insufficient documentation

## 2021-01-27 DIAGNOSIS — G894 Chronic pain syndrome: Secondary | ICD-10-CM

## 2021-01-27 DIAGNOSIS — Z7984 Long term (current) use of oral hypoglycemic drugs: Secondary | ICD-10-CM | POA: Insufficient documentation

## 2021-01-27 DIAGNOSIS — R251 Tremor, unspecified: Secondary | ICD-10-CM

## 2021-01-27 IMAGING — DX DG HIP (WITH OR WITHOUT PELVIS) 1V*R*
2 series · 2 of 2 positions shown · non-contrast
Comparison: None.

CLINICAL DATA: Fall with right hip pain

EXAM:
DG HIP (WITH OR WITHOUT PELVIS) 1V RIGHT

[hip ap]
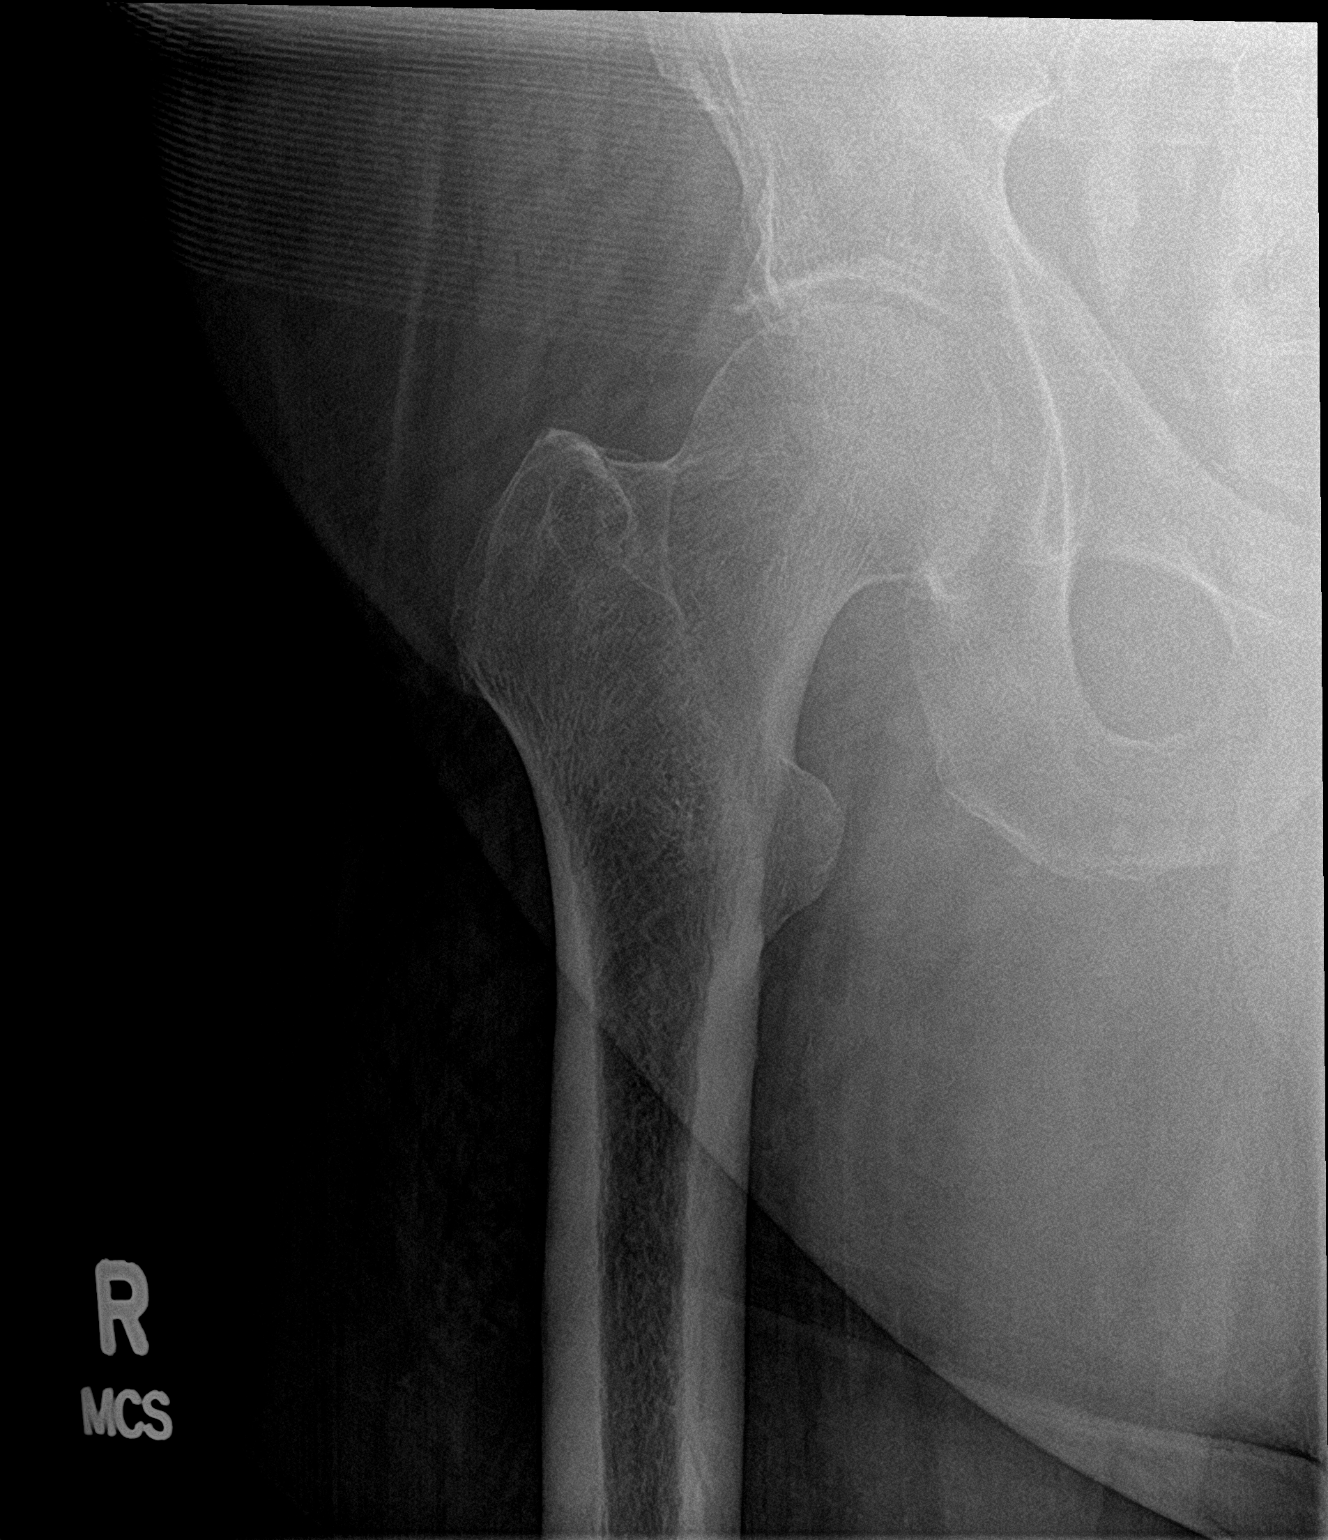

[hip lat]
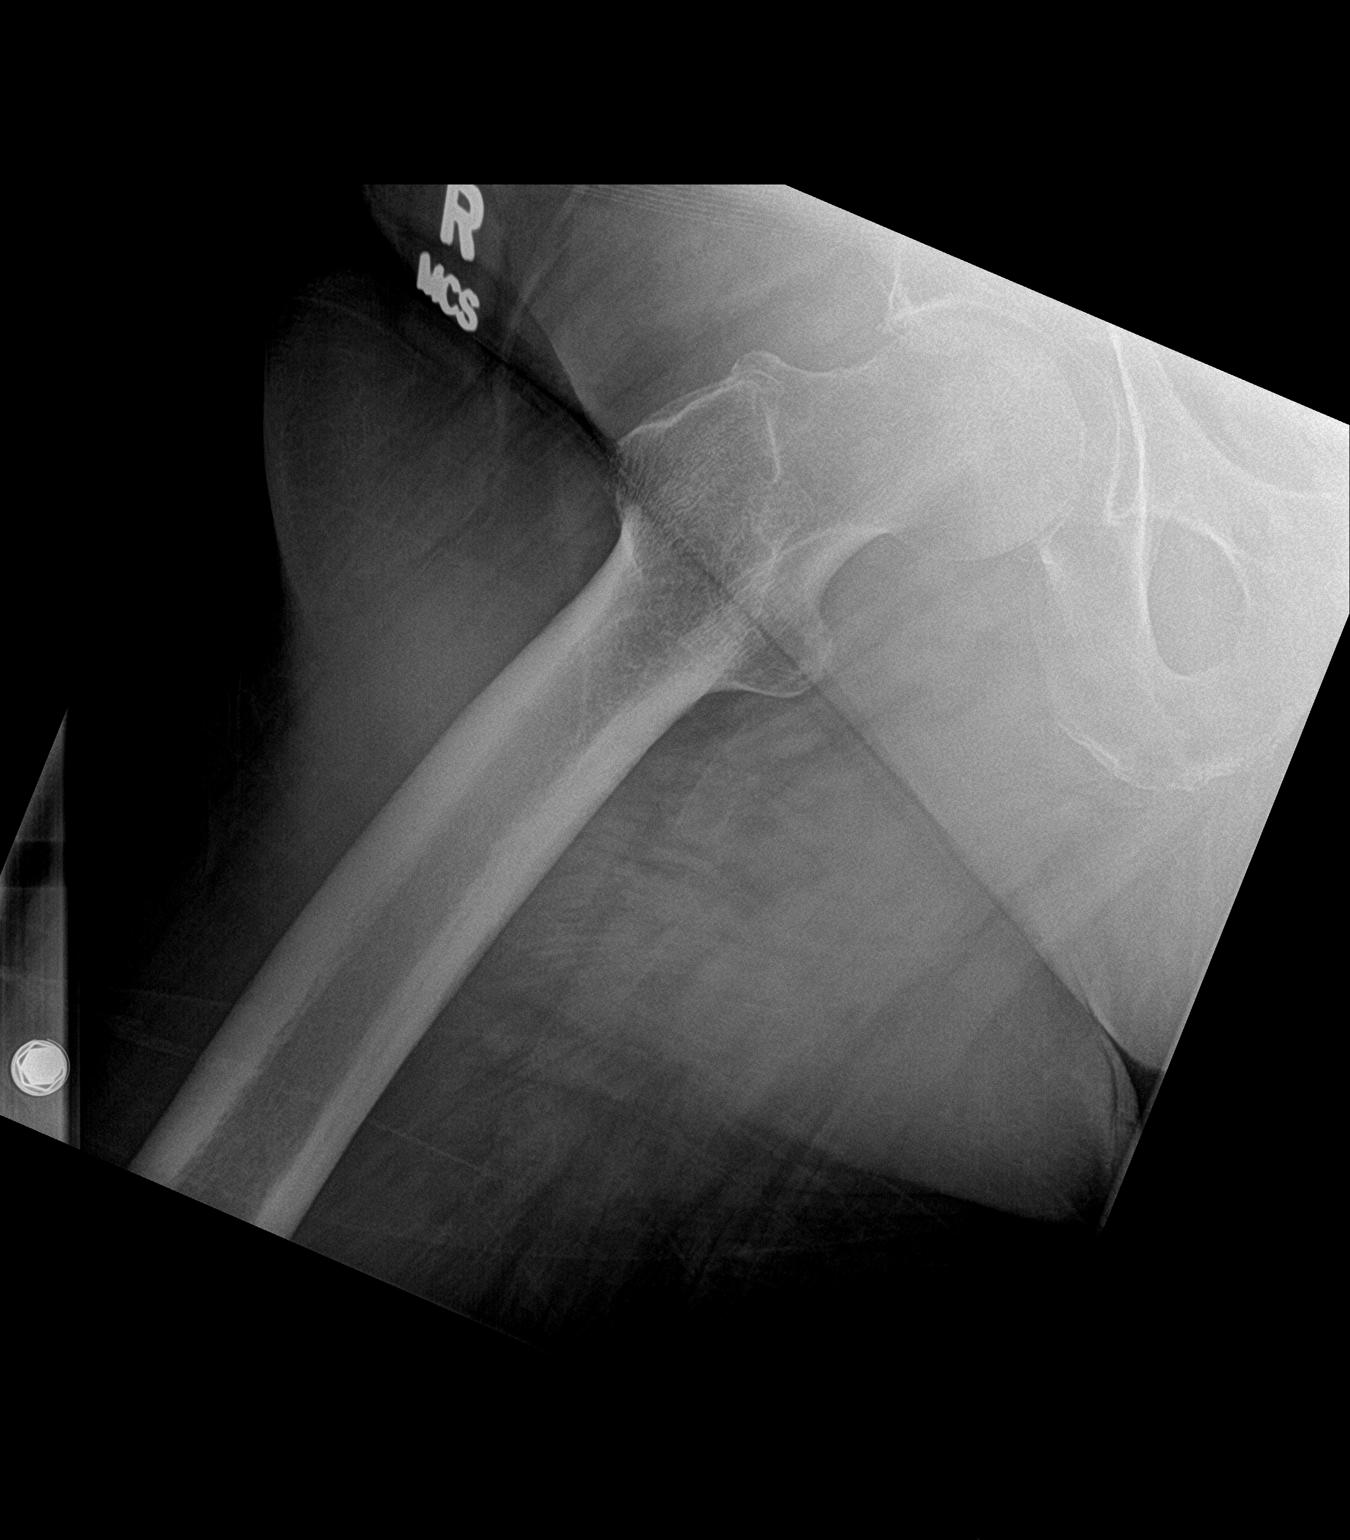

[2 of 2 positions shown; findings below may reference images not displayed]

FINDINGS: There is no evidence of hip fracture or dislocation. There is no
evidence of arthropathy or other focal bone abnormality.
IMPRESSION: Negative.

## 2021-01-27 IMAGING — DX DG FOOT COMPLETE 3+V*R*
3 series · 3 of 3 positions shown · non-contrast
Comparison: None.

CLINICAL DATA: Fall with bruising of the second toe.

EXAM:
RIGHT FOOT COMPLETE - 3+ VIEW

[foot ap]
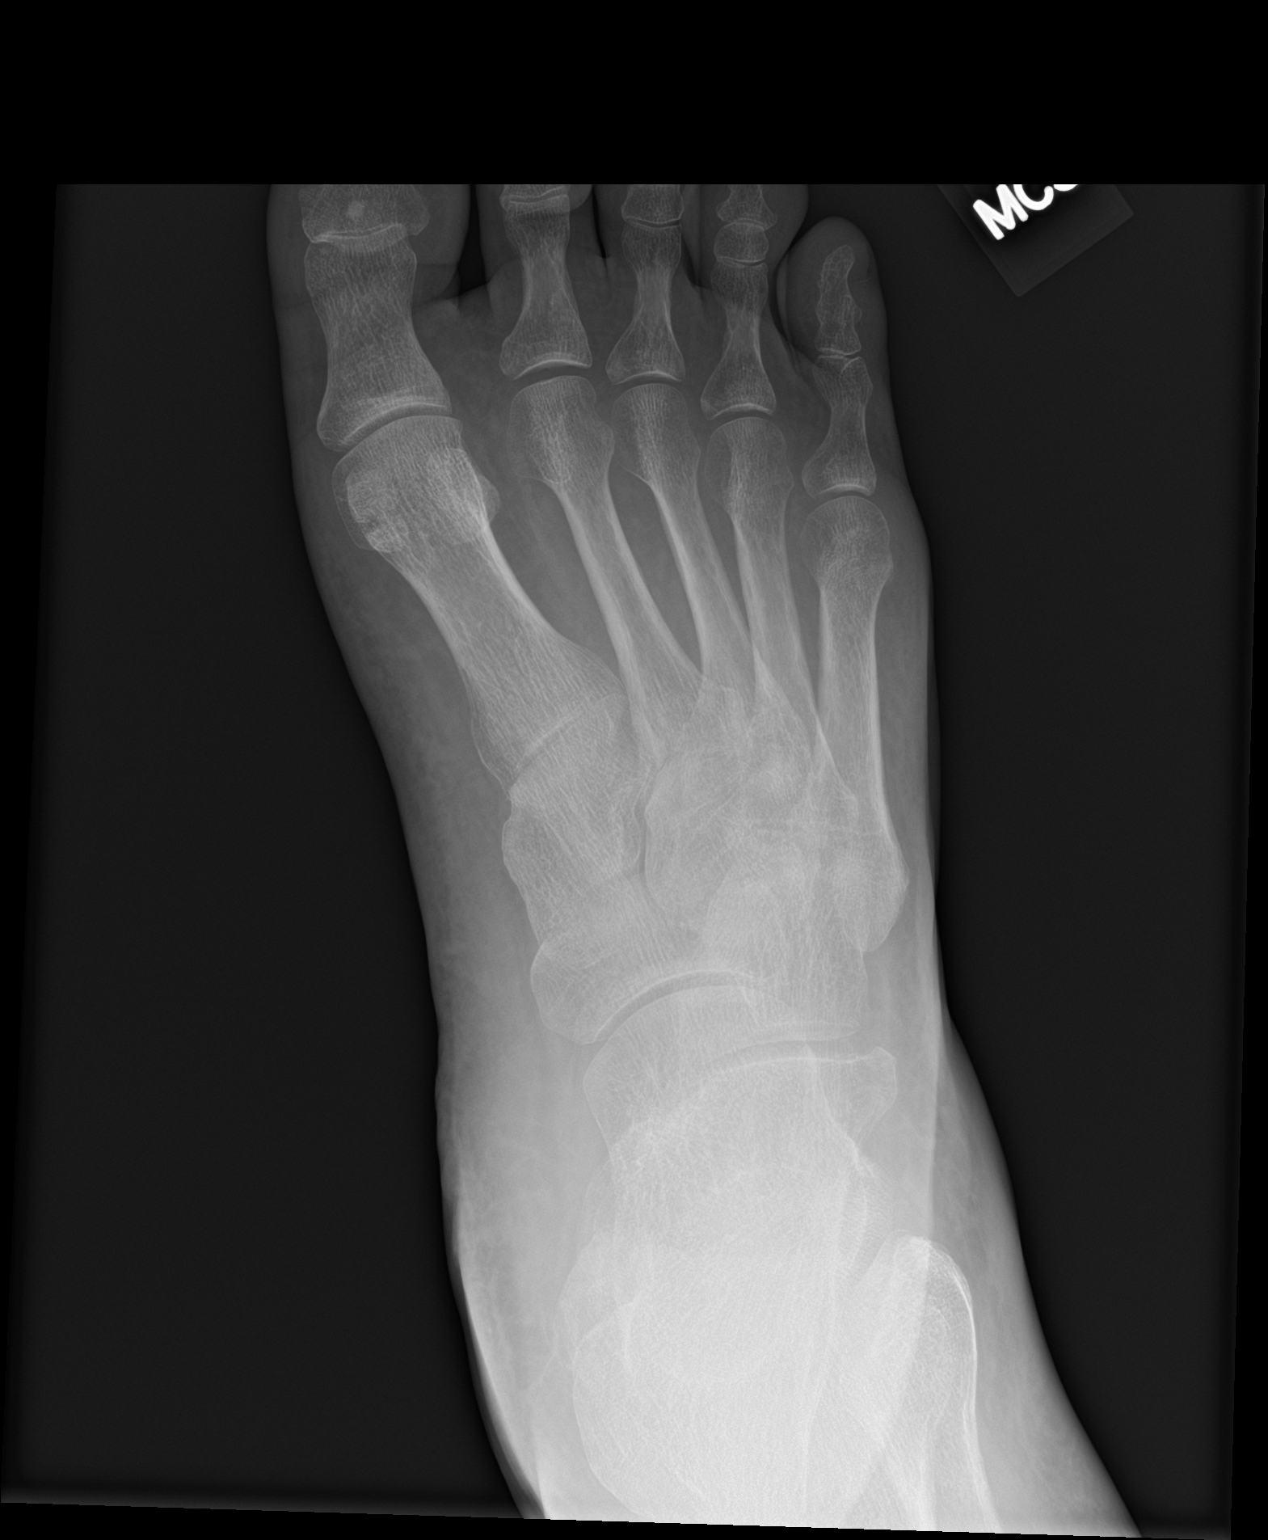

[foot obl]
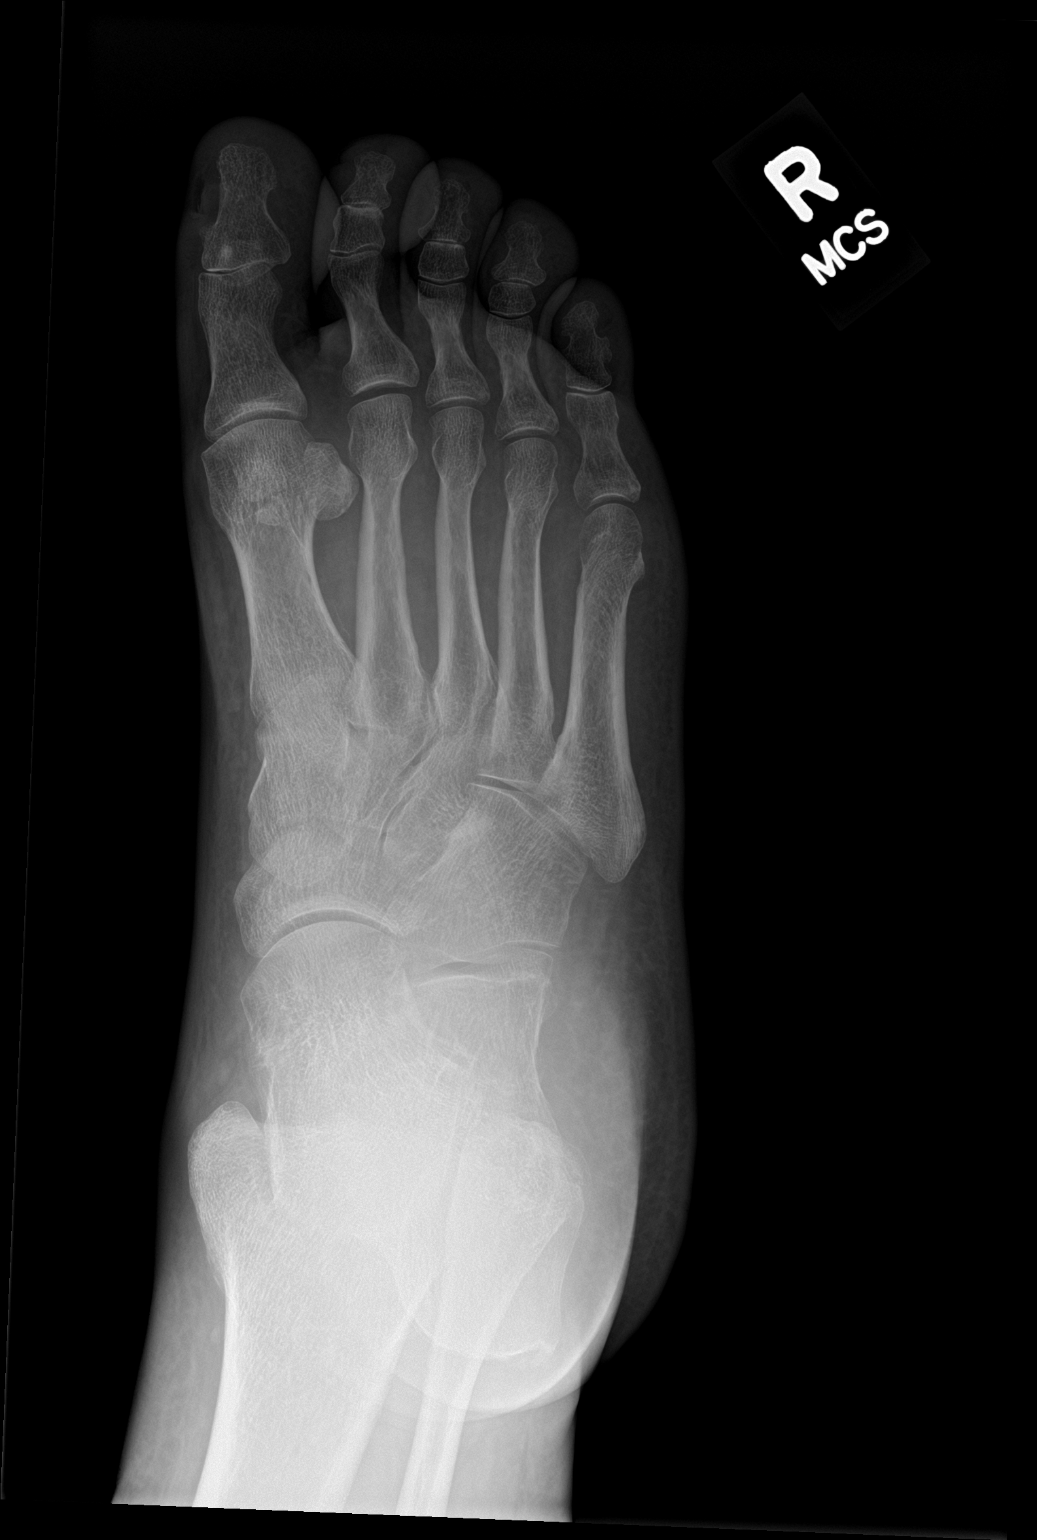

[foot lat]
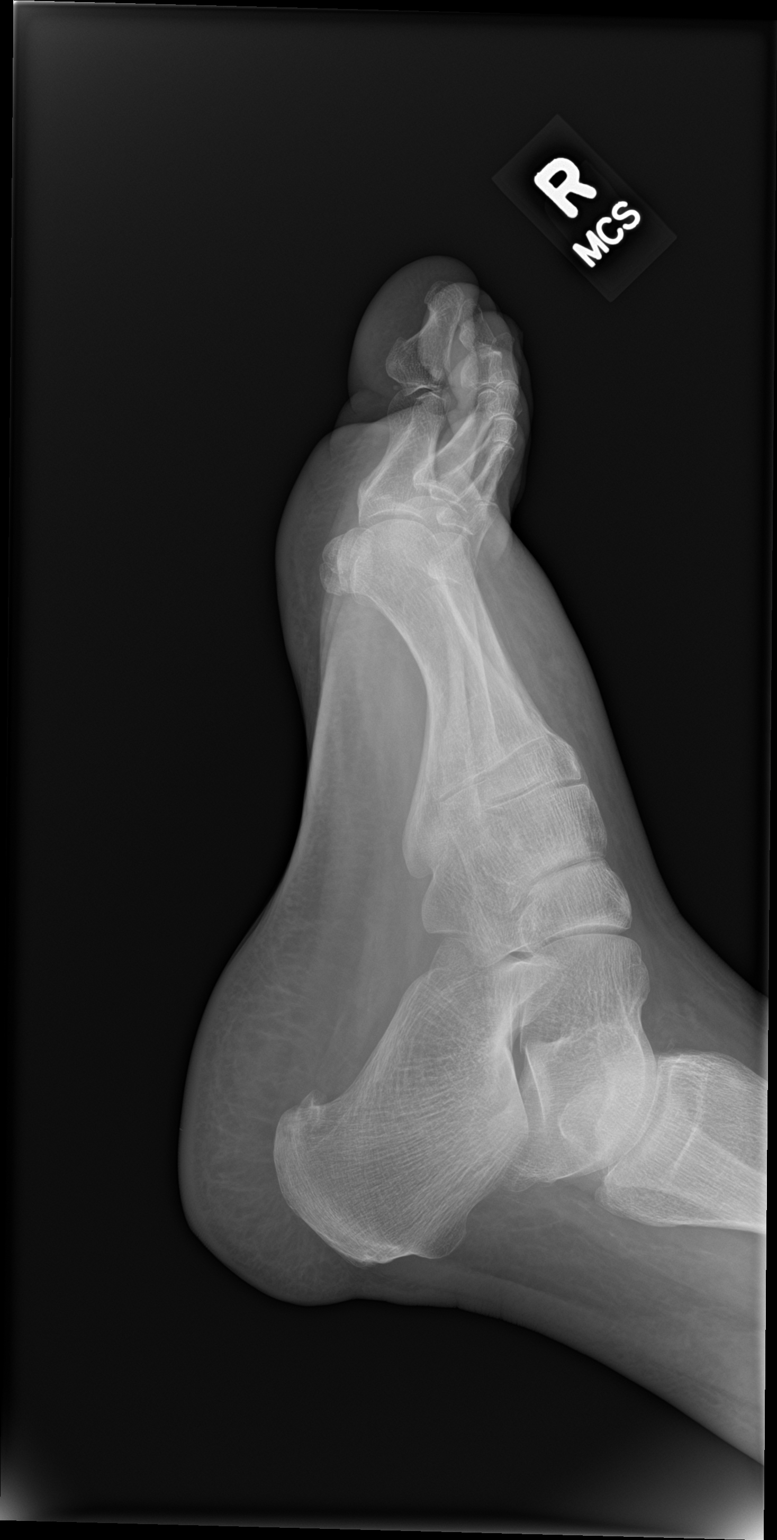

[3 of 3 positions shown; findings below may reference images not displayed]

FINDINGS: There is no evidence of fracture or dislocation. There is no
evidence of arthropathy or other focal bone abnormality. Soft
tissues are unremarkable.
IMPRESSION: Negative.

## 2021-01-27 IMAGING — DX DG HIP (WITH OR WITHOUT PELVIS) 1V*R*
1 series · 1 of 1 positions shown · non-contrast
Comparison: None.

CLINICAL DATA: Fall with right hip pain

EXAM:
DG HIP (WITH OR WITHOUT PELVIS) 1V RIGHT

[pelvis ap]
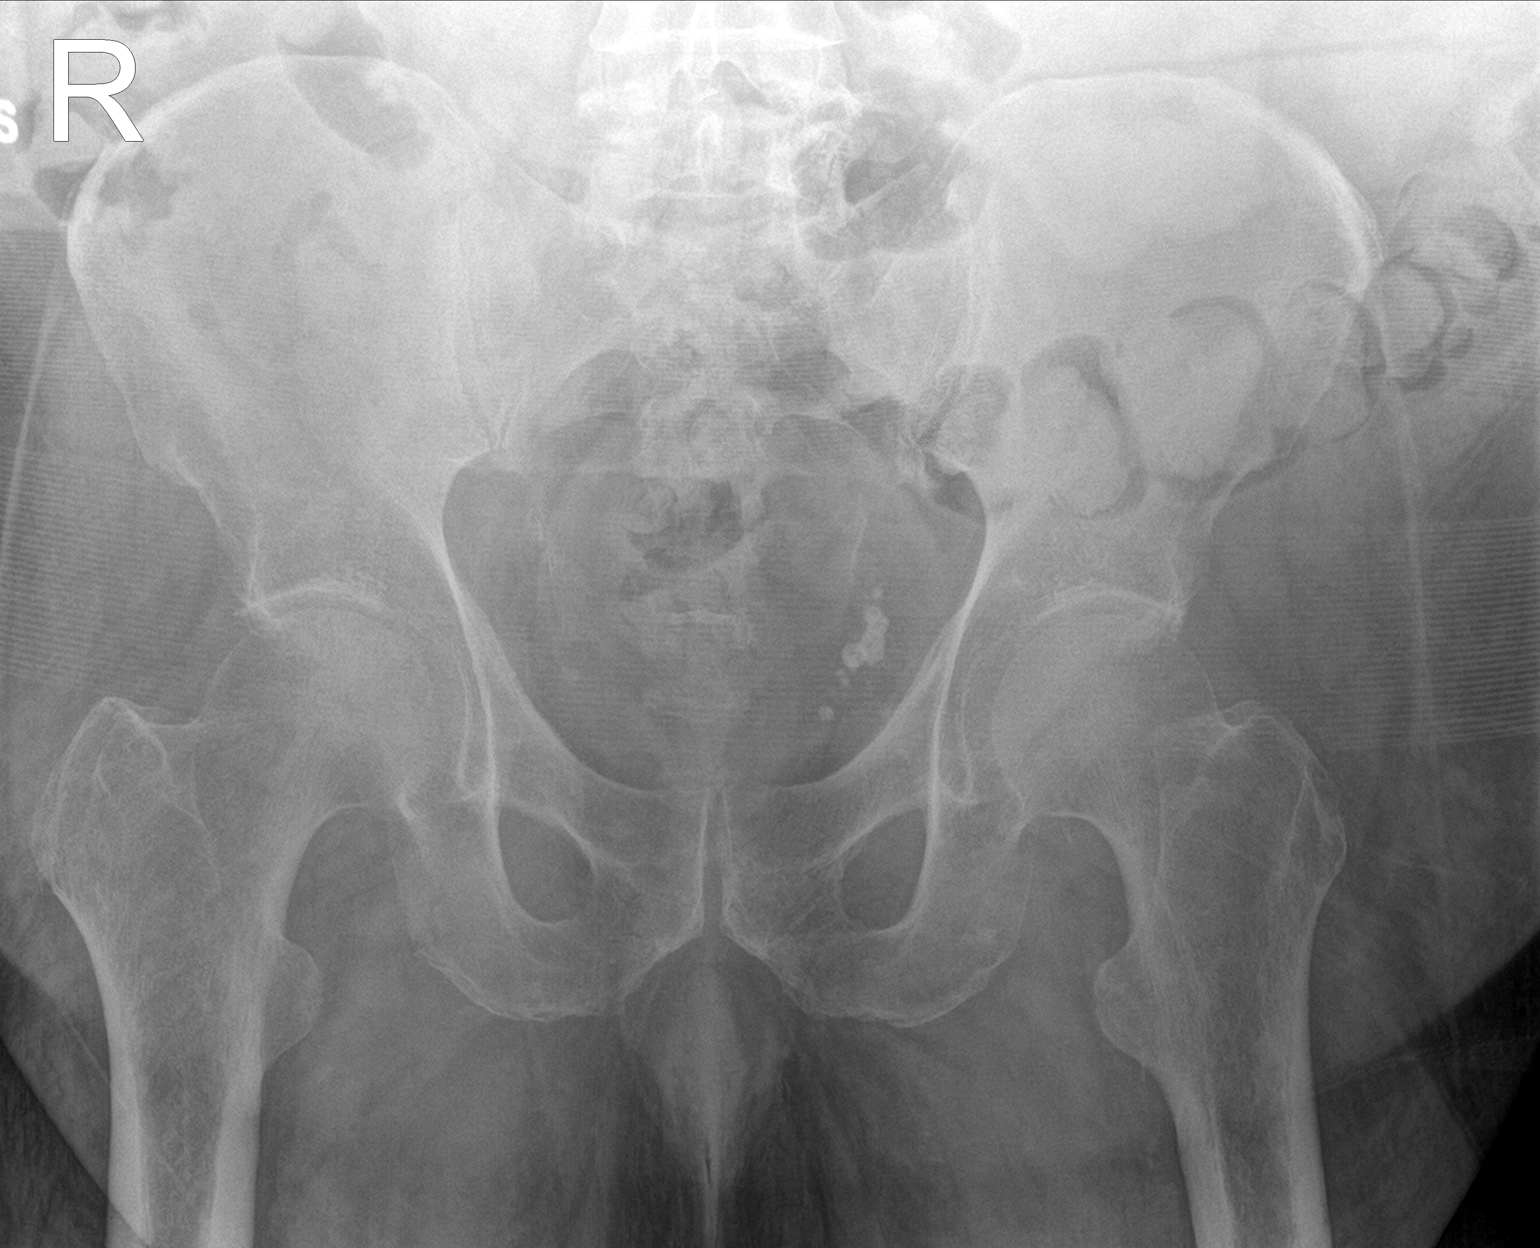

[1 of 1 positions shown; findings below may reference images not displayed]

FINDINGS: There is no evidence of hip fracture or dislocation. There is no
evidence of arthropathy or other focal bone abnormality.
IMPRESSION: Negative.

## 2021-01-27 MED ORDER — KETOROLAC TROMETHAMINE 15 MG/ML IJ SOLN
15.0000 mg | Freq: Once | INTRAMUSCULAR | Status: AC
  Administered 2021-01-27: 15 mg via INTRAMUSCULAR
  Filled 2021-01-27: qty 1

## 2021-01-27 MED ORDER — HYDROCODONE-ACETAMINOPHEN 5-325 MG PO TABS
1.0000 | ORAL_TABLET | Freq: Once | ORAL | Status: AC
  Administered 2021-01-27: 1 via ORAL
  Filled 2021-01-27: qty 1

## 2021-01-27 NOTE — Patient Instructions (Signed)
Visit Information  PATIENT GOALS:  Goals Addressed               This Visit's Progress     COMPLETED: Receive Short-Term Rehabilitative Services in a [place]. (pt-stated)   Not on track     Timeframe:  Short-Range Goal Priority:  High Start Date:  01/04/2021                          Expected End Date:  01/27/2021  Follow-Up Plan: No Follow-Up Required.  Patient Goals/Self-Care Activities: Continue to work with home health nursing, physical therapist, occupational therapist and social worker with [facility]. Revisited placement options with patient, but patient reported that he is no longer interested in pursing options at this time. Patient is wanting to receive a second opinion about recent diagnosis of Chronic Inflammatory Demyelinating Polyradiculoneuropathy and has an appointment scheduled with Dr. Celine Ahr, Nephrologist at [facility], on 03/24/2021 at 9:00am.  Patient is wanting to receive a third opinion about recent diagnosis of Chronic Inflammatory Demyelinating Polyradiculoneuropathy and has requested that his Primary Care Physician, Dr. [name], schedule an appointment for him with a nephrologist at [facility]   Patient will contact LCSW directly [phone]) if he changes his mind about wanting to pursue placement options, or if additional social work needs are identified in the near future.          Patient verbalizes understanding of instructions provided today and agrees to view in [place].   No Follow-Up Required.  [place] Licensed Clinical Social Worker [place] [phone]

## 2021-01-27 NOTE — ED Triage Notes (Signed)
Fall 3 days at home  right hip pain, right 2nd toe injury. Hx chronic pain.

## 2021-01-27 NOTE — Chronic Care Management (AMB) (Signed)
Chronic Care Management    Clinical Social Work Note  01/27/2021 Name: Tyler Brewer MRN: [id number] DOB: [birth year]  Tyler Brewer is a 60 y.o. year old male who is a primary care patient of [name], DO. The CCM team was consulted to assist the patient with chronic disease management and/or care coordination needs related to: [employer] and Level of Care Concerns in Patient with History of Falls/Fall Risk, Unsteady Gait, Impaired Mobility, Weakness, Tremor, Syncope, Balance Problem and Lower Extremity Edema.  Engaged with patient by telephone for follow up visit in response to provider referral for social work chronic care management and care coordination services.   Consent to Services:  The patient was given information about Chronic Care Management services, agreed to services, and gave verbal consent prior to initiation of services.  Please see initial visit note for detailed documentation.   Patient agreed to services and consent obtained.   Assessment: Review of patient past medical history, allergies, medications, and health status, including review of relevant consultants reports was performed today as part of a comprehensive evaluation and provision of chronic care management and care coordination services.     SDOH (Social Determinants of Health) assessments and interventions performed:    Advanced Directives Status: Not ready or willing to discuss.  CCM Care Plan  No Known Allergies  Outpatient Encounter Medications as of 01/27/2021  Medication Sig Note   acetaminophen (TYLENOL) 500 MG tablet Take 500 mg by mouth every 6 (six) hours as needed. States he is taking one to two tablets a day    amLODipine (NORVASC) 10 MG tablet Take 1 tablet (10 mg total) by mouth daily.    DULoxetine (CYMBALTA) 30 MG capsule Take 1 capsule (30 mg total) by mouth daily. (Patient taking differently: Take 30 mg by mouth 3 (three) times a week.)    furosemide (LASIX) 20 MG  tablet TAKE 1 TABLET(20 MG) BY MOUTH DAILY    gabapentin (NEURONTIN) 300 MG capsule Take 2 tablets tid    hydrALAZINE (APRESOLINE) 50 MG tablet Take 1 tablet (50 mg total) by mouth every 8 (eight) hours.    ibuprofen (ADVIL) 200 MG tablet Take 200 mg by mouth every 6 (six) hours as needed.    insulin aspart (NOVOLOG FLEXPEN) 100 UNIT/ML FlexPen Inject 12 Units into the skin 3 (three) times daily with meals. 01/25/2021: Patient states was told to take 15 units prior to meals by pharmacist when he picked up prednisone.    insulin degludec (TRESIBA FLEXTOUCH) 200 UNIT/ML FlexTouch Pen Inject 46 Units into the skin daily.    Insulin Pen Needle 31G X 5 MM MISC 1 Device by Does not apply route in the morning, at noon, in the evening, and at bedtime.    losartan (COZAAR) 100 MG tablet Take 1 tablet (100 mg total) by mouth daily.    meloxicam (MOBIC) 7.5 MG tablet Take 1 tablet (7.5 mg total) by mouth daily.    Menthol, Topical Analgesic, (ICY HOT EX) Apply topically as needed.    metFORMIN (GLUCOPHAGE-XR) 500 MG 24 hr tablet Take 2 tablets (1,000 mg total) by mouth in the morning and at bedtime.    methocarbamol (ROBAXIN) 500 MG tablet TAKE ONE TABLET BY MOUTH FOUR TIMES A DAY    pravastatin (PRAVACHOL) 40 MG tablet Take 1 tablet (40 mg total) by mouth daily.    predniSONE (DELTASONE) 10 MG tablet TAKE 3 TABLETS PO QD FOR 3 DAYS THEN TAKE 2 TABLETS PO QD FOR 3  DAYS THEN TAKE 1 TABLET PO QD FOR 3 DAYS THEN TAKE 1/2 TAB PO QD FOR 3 DAYS    tamsulosin (FLOMAX) 0.4 MG CAPS capsule Take 1 capsule (0.4 mg total) by mouth daily.    traMADol (ULTRAM) 50 MG tablet TAKE ONE TO TWO TABLETS BY MOUTH EVERY 6 HOURS AS NEEDED FOR PAIN    No facility-administered encounter medications on file as of 01/27/2021.    Patient Active Problem List   Diagnosis Date Noted   CIDP (chronic inflammatory demyelinating polyneuropathy) ([place]) 01/12/2021   Diabetes mellitus ([place]) 01/03/2021   Type 2 diabetes mellitus with  hyperglycemia, with long-term current use of insulin ([place]) 01/03/2021   Lower extremity edema 11/28/2020   Chronic low back pain 11/28/2020   Psoriatic arthritis ([place]) 11/28/2020   Thoracic aortic aneurysm without rupture ([place]) 11/28/2020   Bilateral leg weakness 11/05/2020   Acute left-sided low back pain with right-sided sciatica 07/19/2020   Hip pain, acute, left 07/05/2020   Tremor 07/05/2020   Weakness 07/05/2020   Balance problem 03/11/2020   Tinea cruris 03/11/2020   Cellulitis of left groin 03/11/2020   Acute pain of right shoulder 03/11/2020   Interstitial lung disease ([place]) 05/02/2017   Pansinusitis 09/26/2016   Diabetic polyneuropathy associated with diabetes mellitus due to underlying condition ([place]) 05/08/2016   Methamphetamine use disorder, severe, dependence ([place]) 02/11/2016   Substance induced mood disorder ([place]) 02/11/2016   Exertional dyspnea 11/30/2015   ADD (attention deficit disorder) 09/29/2015   Binge eating 09/29/2015   Syncope 05/05/2012   Diarrhea 05/05/2012   Panic attacks 05/05/2012   OSA (obstructive sleep apnea) 05/05/2012   HTN (hypertension) 05/05/2012   Dyslipidemia 05/05/2012   CAD (coronary artery disease)  cardiac cath with moderate disease in a septal branch of the ramus intermedius 04/01/2012   Chest pain 04/01/2012   Drug abuse and dependence ([place]) 04/01/2012   Family history of coronary artery disease 03/31/2012   Sleep apnea, on C-pap 03/31/2012   Hyperlipemia 03/30/2012   HTN (hypertension), benign 03/30/2012   Morbid obesity ([place]) 03/30/2012   DM type 2, uncontrolled, with neuropathy ([place]) 03/30/2012   Depression with suicidal ideation 03/30/2012    Conditions to be addressed/monitored: [employer] and Level of Care Concerns in Patient with History of Falls/Fall Risk, Unsteady Gait, Impaired Mobility, Weakness, Tremor, Syncope, Balance Problem and Lower Extremity Edema. Limited Social Support, Housing Barriers, Level of Care  Concerns, Chronic Pain, ADL/IADL Limitations, and Limited Access to Caregiver.  Care Plan : LCSW Plan of Care  Updates made by Francis Gaines, LCSW since 01/27/2021 12:00 AM     Problem: Receive Short-Term Rehabilitative Services in a [place]. Resolved 01/27/2021  Priority: High     Goal: Receive Short-Term Rehabilitative Services in a [place]. Completed 01/27/2021  Start Date: 01/04/2021  Expected End Date: 01/27/2021  This Visit's Progress: Not on track  Recent Progress: On track  Priority: High  Note:   Current Barriers:   Patient with History of Falls/Fall Risk, Unsteady Gait, Impaired Mobility, Weakness, Tremor, Syncope, Balance Problem and Lower Extremity Edema needs assistance with initiating short-term rehabilitative services in a skilled nursing facility. Patient's health has declined and patient is no longer able to complete activities of daily living independently. Patient acknowledges deficits with education and support in order to meet this need. Patient does not attend all scheduled provider appointments. Patient does not adhere to prescribed medication regimen. Patient lacks social connections. Patient is experiencing financial constraints related to inability  to pay for rent/housing. Patient has limited social support. Patient has level of care concerns. Patient has mental health concerns. Patient is experiencing family and relationship dysfunction. Patient has limited access to caregiver. Patient is experiencing memory deficits. Clinical Goal(s):  Over the next 30 to 45 days, patient will receive a second and third opinion about recent diagnosis of Chronic Inflammatory Demyelinating Polyradiculoneuropathy through [facility] and [facility].  Patient will work [place] Social Worker, Lattie Haw with [facility], to coordinate needs for short-term skilled nursing placement for rehabilitative  services versus long-term care placement into an assisted living facility. Interventions: Collaboration with Primary Care Physician, Dr. [name] regarding development and update of comprehensive plan of care as evidenced by provider attestation and co-signature. Inter-disciplinary care team collaboration (see longitudinal plan of care). Assessed needs and provided education on level of care and facility placement process. Completed FL-2 Form and faxed to Primary Care Physician, Dr. [name] for review and signature. Obtained patient's verbal consent to fax FL-2 Form to facilities of interest. Obtained patient's verbal consent to initiate a PASSAR number. Reviewed and initiated process to obtain PASSAR number. FL-2 Form faxed to [facility] and [place], per patient's request. Collaboration with identified facilities of interest [facility] [place]) to confirm receipt of completed and signed FL-2 Form. Collaboration with Mora Appl, Home Health Physical Therapist with [facility] - # [phone], to request home health physical therapy notes and recommendations. Collaboration with Costella Hatcher, [place] Occupational Therapist with [facility] - # [phone], to request home health occupational therapy notes and recommendations. Collaboration with Lattie Haw, [facility] Social Worker with [facility] - # [phone], to request assistance with skilled nursing placement for patient. Collaboration with Dr. Madalyn Rob, Attending Physician at [facility]) regarding request for rapid COVID-19 Screening. Collaboration with representative from [employer] to try and obtain approval/prior authorization for short-term skilled nursing placement. Faxed home health physical and occupational therapy notes and recommendations, along with completed and  signed FL-2 Form to [employer] to try and obtain approval/prior authorization for short-term skilled nursing placement. Patient interviewed and appropriate assessments performed. Discussed plans with patient for ongoing care management follow up and provided patient with direct contact information for care management team. Advised patient to answer all calls from LCSW, confirming contact information for LCSW. Patient Goals/Self-Care Activities: Continue to work with home health nursing, physical therapist, occupational therapist and social worker with [facility]. Revisited placement options with patient, but patient reported that he is no longer interested in pursing options at this time. Patient is wanting to receive a second opinion about recent diagnosis of Chronic Inflammatory Demyelinating Polyradiculoneuropathy and has an appointment scheduled with Dr. Celine Ahr, Nephrologist at [facility], on 03/24/2021 at 9:00am.  Patient is wanting to receive a third opinion about recent diagnosis of Chronic Inflammatory Demyelinating Polyradiculoneuropathy and has requested that his Primary Care Physician, Dr. [name], schedule an appointment for him with a nephrologist at [facility].   Patient will contact LCSW directly [phone]) if you change your mind about wanting to pursue placement options, or if additional social work needs are identified in the near future.  Follow-Up:  No Follow-Up Required.      Follow Up Plan: No Follow-Up Required.      [place] LCSW [place] representative  [place] [phone]

## 2021-01-27 NOTE — ED Triage Notes (Signed)
Pt arrived [facility] w complaint of rt hip pain after falling getting out of tub 2 days ago  and 2nd toe on rt foot bruising,  ambulatory on scene w cane

## 2021-01-27 NOTE — Discharge Instructions (Addendum)
Your x-ray today did not show any fracture or acute finding.  You were provided with pain medication while in the ED.  Please continue to follow-up with your primary care physician along with your pain management team.

## 2021-01-29 ENCOUNTER — Encounter: Payer: Self-pay | Admitting: Family Medicine

## 2021-01-30 ENCOUNTER — Ambulatory Visit: Payer: Medicare HMO

## 2021-01-30 ENCOUNTER — Encounter: Payer: Self-pay | Admitting: Family Medicine

## 2021-01-30 DIAGNOSIS — E1165 Type 2 diabetes mellitus with hyperglycemia: Secondary | ICD-10-CM

## 2021-01-30 DIAGNOSIS — R45851 Suicidal ideations: Secondary | ICD-10-CM | POA: Diagnosis not present

## 2021-01-30 DIAGNOSIS — E785 Hyperlipidemia, unspecified: Secondary | ICD-10-CM

## 2021-01-30 DIAGNOSIS — E0842 Diabetes mellitus due to underlying condition with diabetic polyneuropathy: Secondary | ICD-10-CM | POA: Diagnosis not present

## 2021-01-30 DIAGNOSIS — R69 Illness, unspecified: Secondary | ICD-10-CM | POA: Diagnosis not present

## 2021-01-30 DIAGNOSIS — E1140 Type 2 diabetes mellitus with diabetic neuropathy, unspecified: Secondary | ICD-10-CM | POA: Diagnosis not present

## 2021-01-30 DIAGNOSIS — IMO0002 Reserved for concepts with insufficient information to code with codable children: Secondary | ICD-10-CM

## 2021-01-30 DIAGNOSIS — I10 Essential (primary) hypertension: Secondary | ICD-10-CM

## 2021-01-30 DIAGNOSIS — Z794 Long term (current) use of insulin: Secondary | ICD-10-CM | POA: Diagnosis not present

## 2021-01-30 DIAGNOSIS — F32A Depression, unspecified: Secondary | ICD-10-CM | POA: Diagnosis not present

## 2021-01-31 ENCOUNTER — Telehealth: Payer: Self-pay

## 2021-01-31 ENCOUNTER — Other Ambulatory Visit: Payer: Self-pay | Admitting: Family Medicine

## 2021-01-31 DIAGNOSIS — G894 Chronic pain syndrome: Secondary | ICD-10-CM

## 2021-01-31 NOTE — Chronic Care Management (AMB) (Signed)
Chronic Care Management   CCM RN Visit Note  01/31/2021 Name: Tyler Brewer MRN: [id number] DOB: 07/02/1961  Subjective: Tyler Brewer is a 60 y.o. year old male who is a primary care patient of [name], DO. The care management team was consulted for assistance with disease management and care coordination needs.    Engaged with patient by telephone for follow up visit in response to provider referral for case management and/or care coordination services.   Consent to Services:  The patient was given information about Chronic Care Management services, agreed to services, and gave verbal consent prior to initiation of services.  Please see initial visit note for detailed documentation.   Patient agreed to services and verbal consent obtained.   Assessment: Review of patient past medical history, allergies, medications, health status, including review of consultants reports, laboratory and other test data, was performed as part of comprehensive evaluation and provision of chronic care management services.   SDOH (Social Determinants of Health) assessments and interventions performed:    CCM Care Plan  No Known Allergies  Outpatient Encounter Medications as of 01/30/2021  Medication Sig Note   acetaminophen (TYLENOL) 500 MG tablet Take 500 mg by mouth every 6 (six) hours as needed. States he is taking one to two tablets a day    amLODipine (NORVASC) 10 MG tablet Take 1 tablet (10 mg total) by mouth daily.    DULoxetine (CYMBALTA) 30 MG capsule Take 1 capsule (30 mg total) by mouth daily. (Patient taking differently: Take 30 mg by mouth 3 (three) times a week.)    furosemide (LASIX) 20 MG tablet TAKE 1 TABLET(20 MG) BY MOUTH DAILY    gabapentin (NEURONTIN) 300 MG capsule Take 2 tablets tid    hydrALAZINE (APRESOLINE) 50 MG tablet Take 1 tablet (50 mg total) by mouth every 8 (eight) hours.    ibuprofen (ADVIL) 200 MG tablet Take 200 mg by mouth every 6 (six) hours as  needed.    insulin aspart (NOVOLOG FLEXPEN) 100 UNIT/ML FlexPen Inject 12 Units into the skin 3 (three) times daily with meals. 01/25/2021: Patient states was told to take 15 units prior to meals by pharmacist when he picked up prednisone.    insulin degludec (TRESIBA FLEXTOUCH) 200 UNIT/ML FlexTouch Pen Inject 46 Units into the skin daily.    losartan (COZAAR) 100 MG tablet Take 1 tablet (100 mg total) by mouth daily.    meloxicam (MOBIC) 7.5 MG tablet Take 1 tablet (7.5 mg total) by mouth daily.    Menthol, Topical Analgesic, (ICY HOT EX) Apply topically as needed.    metFORMIN (GLUCOPHAGE-XR) 500 MG 24 hr tablet Take 2 tablets (1,000 mg total) by mouth in the morning and at bedtime.    methocarbamol (ROBAXIN) 500 MG tablet TAKE ONE TABLET BY MOUTH FOUR TIMES A DAY    pravastatin (PRAVACHOL) 40 MG tablet Take 1 tablet (40 mg total) by mouth daily.    predniSONE (DELTASONE) 10 MG tablet TAKE 3 TABLETS PO QD FOR 3 DAYS THEN TAKE 2 TABLETS PO QD FOR 3 DAYS THEN TAKE 1 TABLET PO QD FOR 3 DAYS THEN TAKE 1/2 TAB PO QD FOR 3 DAYS    tamsulosin (FLOMAX) 0.4 MG CAPS capsule Take 1 capsule (0.4 mg total) by mouth daily.    traMADol (ULTRAM) 50 MG tablet TAKE ONE TO TWO TABLETS BY MOUTH EVERY 6 HOURS AS NEEDED FOR PAIN    Insulin Pen Needle 31G X 5 MM MISC 1 Device by Does  not apply route in the morning, at noon, in the evening, and at bedtime.    No facility-administered encounter medications on file as of 01/30/2021.    Patient Active Problem List   Diagnosis Date Noted   CIDP (chronic inflammatory demyelinating polyneuropathy) ([place]) 01/12/2021   Diabetes mellitus ([place]) 01/03/2021   Type 2 diabetes mellitus with hyperglycemia, with long-term current use of insulin ([place]) 01/03/2021   Lower extremity edema 11/28/2020   Chronic low back pain 11/28/2020   Psoriatic arthritis ([place]) 11/28/2020   Thoracic aortic aneurysm without rupture ([place]) 11/28/2020   Bilateral leg weakness 11/05/2020   Acute  left-sided low back pain with right-sided sciatica 07/19/2020   Hip pain, acute, left 07/05/2020   Tremor 07/05/2020   Weakness 07/05/2020   Balance problem 03/11/2020   Tinea cruris 03/11/2020   Cellulitis of left groin 03/11/2020   Acute pain of right shoulder 03/11/2020   Interstitial lung disease ([place]) 05/02/2017   Pansinusitis 09/26/2016   Diabetic polyneuropathy associated with diabetes mellitus due to underlying condition ([place]) 05/08/2016   Methamphetamine use disorder, severe, dependence ([place]) 02/11/2016   Substance induced mood disorder ([place]) 02/11/2016   Exertional dyspnea 11/30/2015   ADD (attention deficit disorder) 09/29/2015   Binge eating 09/29/2015   Syncope 05/05/2012   Diarrhea 05/05/2012   Panic attacks 05/05/2012   OSA (obstructive sleep apnea) 05/05/2012   HTN (hypertension) 05/05/2012   Dyslipidemia 05/05/2012   CAD (coronary artery disease)  cardiac cath with moderate disease in a septal branch of the ramus intermedius 04/01/2012   Chest pain 04/01/2012   Drug abuse and dependence ([place]) 04/01/2012   Family history of coronary artery disease 03/31/2012   Sleep apnea, on C-pap 03/31/2012   Hyperlipemia 03/30/2012   HTN (hypertension), benign 03/30/2012   Morbid obesity ([place]) 03/30/2012   DM type 2, uncontrolled, with neuropathy ([place]) 03/30/2012   Depression with suicidal ideation 03/30/2012    Conditions to be addressed/monitored:HLD, DMII, and fall risk/prevention  Care Plan : Quality of Life  Updates made by Luretha Rued, RN since 01/31/2021 12:00 AM     Problem: Quality of Life   Priority: High     Long-Range Goal: Quality of Life Maintained/Improved   Start Date: 12/06/2020  Expected End Date: 06/08/2021  This Visit's Progress: On track  Recent Progress: On track  Priority: High  Note:   Current Barriers:  Knowledge Deficits related to long term care plan to improve/manage of quality of life. Client with history of chronic disease,  uncontrolled DM. 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 He reports recent neurologist office visit on 01/19/21- diagnosis diabetic radiculopathy. Client with frequent ED visits for pain. Last ED visit was on 01/26/21 with fall/right hip pain. Client states he is active with home health and is in the process of re-certification for additional physical therapy sessions. Client states the pain "manifest itself in different places, back, hips, groin". Client reports upcoming appointment for pain management on 02/16/21. Client with an upcoming appointment for second opinion at [facility] 03/24/21. Per client request, referral to [facility] re: CIDP. Chronic Disease Management support and education needs related to health management. Client states he has applied for handicapped public housing. Unable to independently stand for periods of time for house hold chores -support from friends.  Limited Transportation-reports supportive friends, but not always available to take client to appointments and for pharmacy refills. Nurse Case Manager Clinical Goal(s):  patient will verbalize understanding of plan for maintenance/improvement of quality of life. Work toward goal of "get back healthy enough to join [employer]" patient will attend all scheduled medical appointments:  patient will work with CM clinical social worker to discuss life concerns related to family relationships; limited support: Client voiced communication with clinic Education officer, museum.  the patient will demonstrate ongoing self health care management ability as evidenced by attending provider visits as scheduled, taking medications as prescribed, contacting provider for new or worsening symptoms Interventions:  1:1 collaboration with Carollee Herter, Alferd Apa, DO regarding development and update of comprehensive plan of care as evidenced by  provider attestation and co-signature Inter-disciplinary care team collaboration (see longitudinal plan of care) Evaluation of current treatment plan related to patient's health conditions and patient's adherence to plan as established by provider. Discussed fall prevention strategies; Encouraged client to continue exercises as re commended provided by home heath therapist.  Reviewed medications with patient. Reviewed upcoming appointments: 02/16/21 Dr Alysia Penna; ; 03/24/21 Dr. Fredric Dine; 04/11/21 Dr. [name]; 07/24/21 Dr. Posey Pronto Discussed pain management strategies - encouraged client to continue take medications as prescribed, elevate feet/legs for edema, attend pain management office visit as scheduled. Discussed importance of adequate rest/sleep in tolerating pain.  Reviewed scheduled/upcoming provider appointments including:   Discussed plans with patient for ongoing care management follow up and provided patient with direct contact information for care management team  Patient Goals/Self-Care Activities -Schedule follow up appointment with Primary care provider, per chart: around 04/14/21 or sooner if needed. -continue to take medications as prescribed and follow up with provider if condition is not relieved or worsens. -continue management of diabetes per your Endocrinologist -attend all provider appointment as scheduled. -try to get adequate rest/sleep -continue to use assistive devices as recommended and minimize fall risk(eliminate small throw rugs, keep home well lite, remove clutter from walkways), use hand rails where available. -consider installing grab bars/rails in other places as needed.   Follow Up Plan: The patient has been provided with contact information for the care management team and has been advised to call with any health related questions or concerns.  The care management team will reach out to the patient again over the next 45 days.           Care  Plan : Diabetes Type 2 in patient with history of Hyperlipidemia  Updates made by Luretha Rued, RN since 01/31/2021 12:00 AM     Problem: Disease Progression   Priority: Medium     Long-Range Goal: Disease Progression Prevented or Minimized   Start Date: 12/06/2020  Expected End Date: 06/08/2021  This Visit's Progress: On track  Recent Progress: On track  Priority: Medium  Note:   Objective:  Lab Results  Component Value Date   HGBA1C 9.9 (A) 01/03/2021   Lab Results  Component Value Date   CREATININE 0.89 11/28/2020   CREATININE 1.11 11/20/2020   CREATININE 0.86 11/19/2020  Current Barriers:  Knowledge Deficits related to long term care plan for management of diabetes. Client reports he has obtained and is using the [employer] 2. He reports his blood sugars have been usually ranging 100-120's. He states he has his medications.  A1C 01/03/21 9.9; A1C 11/05/20 10.6; A1C 07/05/20 11.5 Knowledge deficit re: healthier eating plan Limited family support, but reports he has supportive friends. Currently staying with friends. Limited mobility Unable to independently stand for activities such as cooking, cleaning. Transportation needs inconsistent Case Manager Clinical Goal(s):  patient will  demonstrate improved adherence to prescribed treatment plan for diabetes self care/management as evidenced by: daily monitoring and recording of CBG  adherence to prescribed medication regimen contacting provider for new or worsened symptoms or questions Interventions:  Collaboration with Carollee Herter, Alferd Apa, DO regarding development and update of comprehensive plan of care as evidenced by provider attestation and co-signature Inter-disciplinary care team collaboration (see longitudinal plan of care) Reviewed medications with patient Discussed patient diet Reviewed scheduled/upcoming provider appointments including: 02/16/21 Dr Alysia Penna; ; 03/24/21 Dr. Fredric Dine; 04/11/21 Dr.  [name]; 07/24/21 Dr. Posey Pronto Confirmed client has been engaged with clinic social worker Discussed plans with patient for ongoing care management follow up and provided patient with direct contact information for care management team Patient Goals: - attend provider visit as scheduled - continue to check blood sugar as recommended by provider and call provider with questions concerns.  - continue to take medications as prescribed. - know blood sugar target range and when to call doctor - know target Hemoglobin A1C - take the blood sugar meter to all doctor visits once you have begun using - limit fast food meals to no more than 1 per week - schedule appointment with eye doctor - call RNCM if you have any questions regarding use of calendar/organizer. Follow Up Plan: The patient has been provided with contact information for the care management team and has been advised to call with any health related questions or concerns.  The care management team will reach out to the patient again over the next 45 days.      Plan:Telephone follow up appointment with care management team member scheduled for:  02/28/21 and The patient has been provided with contact information for the care management team and has been advised to call with any health related questions or concerns.   Thea Silversmith, RN, MSN, BSN, CCM Care Management Coordinator [facility] [phone]

## 2021-01-31 NOTE — Patient Instructions (Signed)
Visit Information  PATIENT GOALS:  Goals Addressed               This Visit's Progress     improve quality of life-get back healthy enough to join [employer] (pt-stated)   On track     Timeframe:  Long-Range Goal Priority:  Medium Start Date:  12/06/20                           Expected End Date: 06/07/21                     Follow Up Date 02/28/21  -Schedule follow up appointment with Primary care provider, per chart: around 04/14/21 or sooner if needed. -continue to take medications as prescribed and follow up with provider if condition is not relieved or worsens. -continue management of diabetes per your Endocrinologist -attend all provider appointment as scheduled. -try to get adequate rest/sleep -continue to use assistive devices as recommended and minimize fall risk(eliminate small throw rugs, keep home well lite, remove clutter from walkways), use hand rails where available. -consider installing grab bars/rails in other places as needed.     Why is this important?   Having a long-term illness can be scary.  It can also be stressful for you and your caregiver.  These steps may help.    Notes:        Monitor and Manage My Blood Sugar   On track     Timeframe:  Long-Range Goal Priority:  Medium Start Date:  12/06/20                           Expected End Date: 06/07/21                      Follow Up Date 02/28/21  - attend provider visit as scheduled - continue to check blood sugar as recommended by provider and call provider with questions concerns.  - continue to take medications as prescribed. - know blood sugar target range and when to call doctor - know target Hemoglobin A1C - take the blood sugar meter to all doctor visits once you have begun using - limit fast food meals to no more than 1 per week - schedule appointment with eye doctor - call RNCM if you have any questions regarding use of calendar/organizer.   Why is this important?   Checking your  blood sugar at home helps to keep it from getting very high or very low.  Writing the results in a diary or log helps the doctor know how to care for you.  Your blood sugar log should have the time, date and the results.  Also, write down the amount of insulin or other medicine that you take.  Other information, like what you ate, exercise done and how you were feeling, will also be helpful.     Notes:          Patient verbalizes understanding of instructions provided today and agrees to view in [place].   Telephone follow up appointment with care management team member scheduled for:02/28/21 The patient has been provided with contact information for the care management team and has been advised to call with any health related questions or concerns.   Thea Silversmith, RN, MSN, BSN, CCM Care Management Coordinator [facility] [phone]

## 2021-01-31 NOTE — Telephone Encounter (Signed)
We received a refill request via fax from [facility] for Clotrimazole 1% topical cream today. This Rx was discontinued in 2021. Okay to refill?

## 2021-02-01 ENCOUNTER — Emergency Department (HOSPITAL_BASED_OUTPATIENT_CLINIC_OR_DEPARTMENT_OTHER)
Admission: EM | Admit: 2021-02-01 | Discharge: 2021-02-01 | Disposition: A | Discharge status: Home / Self Care | Payer: Medicare HMO | Attending: Emergency Medicine | Admitting: Emergency Medicine

## 2021-02-01 ENCOUNTER — Encounter: Payer: Self-pay | Admitting: Family Medicine

## 2021-02-01 ENCOUNTER — Encounter (HOSPITAL_BASED_OUTPATIENT_CLINIC_OR_DEPARTMENT_OTHER): Payer: Self-pay | Admitting: Emergency Medicine

## 2021-02-01 ENCOUNTER — Other Ambulatory Visit: Payer: Self-pay

## 2021-02-01 DIAGNOSIS — F17210 Nicotine dependence, cigarettes, uncomplicated: Secondary | ICD-10-CM | POA: Insufficient documentation

## 2021-02-01 DIAGNOSIS — Z794 Long term (current) use of insulin: Secondary | ICD-10-CM | POA: Insufficient documentation

## 2021-02-01 DIAGNOSIS — M79604 Pain in right leg: Secondary | ICD-10-CM

## 2021-02-01 DIAGNOSIS — E1140 Type 2 diabetes mellitus with diabetic neuropathy, unspecified: Secondary | ICD-10-CM | POA: Insufficient documentation

## 2021-02-01 DIAGNOSIS — M79661 Pain in right lower leg: Secondary | ICD-10-CM | POA: Diagnosis not present

## 2021-02-01 DIAGNOSIS — M79662 Pain in left lower leg: Secondary | ICD-10-CM | POA: Insufficient documentation

## 2021-02-01 DIAGNOSIS — Z79899 Other long term (current) drug therapy: Secondary | ICD-10-CM | POA: Insufficient documentation

## 2021-02-01 DIAGNOSIS — I10 Essential (primary) hypertension: Secondary | ICD-10-CM | POA: Insufficient documentation

## 2021-02-01 DIAGNOSIS — Z7984 Long term (current) use of oral hypoglycemic drugs: Secondary | ICD-10-CM | POA: Diagnosis not present

## 2021-02-01 DIAGNOSIS — Z743 Need for continuous supervision: Secondary | ICD-10-CM | POA: Diagnosis not present

## 2021-02-01 DIAGNOSIS — R609 Edema, unspecified: Secondary | ICD-10-CM | POA: Diagnosis not present

## 2021-02-01 DIAGNOSIS — I2510 Atherosclerotic heart disease of native coronary artery without angina pectoris: Secondary | ICD-10-CM | POA: Diagnosis not present

## 2021-02-01 DIAGNOSIS — R69 Illness, unspecified: Secondary | ICD-10-CM | POA: Diagnosis not present

## 2021-02-01 MED ORDER — OXYCODONE HCL 5 MG PO TABS
5.0000 mg | ORAL_TABLET | Freq: Once | ORAL | Status: AC
  Administered 2021-02-01: 5 mg via ORAL
  Filled 2021-02-01: qty 1

## 2021-02-01 MED ORDER — OXYCODONE HCL 5 MG PO TABS: 5.0000 mg | ORAL_TABLET | Freq: Four times a day (QID) | ORAL | 0 refills | Status: DC | PRN

## 2021-02-01 MED ORDER — KETOROLAC TROMETHAMINE 60 MG/2ML IM SOLN
60.0000 mg | Freq: Once | INTRAMUSCULAR | Status: AC
  Administered 2021-02-01: 60 mg via INTRAMUSCULAR
  Filled 2021-02-01: qty 2

## 2021-02-01 NOTE — ED Provider Notes (Signed)
[facility]ERG[facility] DEPARTMENT Provider Note   CSN: [id number] Arrival date & time: 02/01/21  5329     History Chief Complaint  Patient presents with   Leg Pain    Tyler Brewer is a 60 y.o. male.  Patient here with acute on chronic leg pain.  History of CIDP.  Gets worsening leg pain at times.  Tramadol at home not helping.  States that his legs get swollen when his pain gets bad.  Swelling has improved today.  Has no shortness of breath.  Denies any injuries or falls.  Is supposed to see chronic pain doctor in 2 weeks as this has become a chronic issue and tramadol has not been helping.  The history is provided by the patient.  Leg Pain Location:  Leg Leg location:  L lower leg and R lower leg Pain details:    Quality:  Aching Chronicity:  Chronic Relieved by:  Nothing Worsened by:  Nothing Associated symptoms: swelling   Associated symptoms: no back pain, no decreased ROM, no fatigue, no fever, no itching, no muscle weakness, no neck pain, no numbness, no stiffness and no tingling       Past Medical History:  Diagnosis Date   Arthritis    CAD (coronary artery disease)  cardiac cath with moderate disease in a septal branch of the ramus intermedius 04/01/2012   Depression    Diabetes mellitus    poorly controlled by his report   History of narcotic addiction ([place])    past history of back pain   Hypercholesteremia    Hypertension    IBS (irritable bowel syndrome)    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[place])    Neuropathy    Obesity    Max weight was 390   OSA on CPAP    Panic attacks    Testosterone deficiency    Vertigo     Patient Active Problem List   Diagnosis Date Noted   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/12/2021   Diabetes mellitus ([place]) 01/03/2021   Type 2 diabetes mellitus with hyperglycemia, with long-term current use of insulin ([place]) 01/03/2021   Lower extremity edema 11/28/2020   Chronic low back pain 11/28/2020    Psoriatic arthritis ([place]) 11/28/2020   Thoracic aortic aneurysm without rupture ([place]) 11/28/2020   Bilateral leg weakness 11/05/2020   Acute left-sided low back pain with right-sided sciatica 07/19/2020   Hip pain, acute, left 07/05/2020   Tremor 07/05/2020   Weakness 07/05/2020   Balance problem 03/11/2020   Tinea cruris 03/11/2020   Cellulitis of left groin 03/11/2020   Acute pain of right shoulder 03/11/2020   Interstitial lung disease ([place]) 05/02/2017   Pansinusitis 09/26/2016   Diabetic polyneuropathy associated with diabetes mellitus due to underlying condition ([place]) 05/08/2016   Methamphetamine use disorder, severe, dependence ([place]) 02/11/2016   Substance induced mood disorder ([place]) 02/11/2016   Exertional dyspnea 11/30/2015   ADD (attention deficit disorder) 09/29/2015   Binge eating 09/29/2015   Syncope 05/05/2012   Diarrhea 05/05/2012   Panic attacks 05/05/2012   OSA (obstructive sleep apnea) 05/05/2012   HTN (hypertension) 05/05/2012   Dyslipidemia 05/05/2012   CAD (coronary artery disease)  cardiac cath with moderate disease in a septal branch of the ramus intermedius 04/01/2012   Chest pain 04/01/2012   Drug abuse and dependence ([place]) 04/01/2012   Family history of coronary artery disease 03/31/2012   Sleep apnea, on C-pap 03/31/2012   Hyperlipemia 03/30/2012   HTN (hypertension), benign 03/30/2012   Morbid  obesity ([place]) 03/30/2012   DM type 2, uncontrolled, with neuropathy ([place]) 03/30/2012   Depression with suicidal ideation 03/30/2012    Past Surgical History:  Procedure Laterality Date   CARDIAC CATHETERIZATION     IR FLUORO GUIDE CV LINE RIGHT  11/10/2020   IR US GUIDE VASC ACCESS RIGHT  11/10/2020   LEFT HEART CATHETERIZATION WITH CORONARY ANGIOGRAM N/A 03/31/2012   Procedure: LEFT HEART CATHETERIZATION WITH CORONARY ANGIOGRAM;  Surgeon: Leonie Man, MD;  Location: [facility] CATH LAB;  Service: Cardiovascular;  Laterality: N/A;       Family History   Problem Relation Age of Onset   Diabetes type II Father    Hypertension Father    Pancreatic disease Father 55       Deceased   Healthy Mother    Healthy Sister    Healthy Son    Healthy Daughter    Parkinson's disease Maternal Grandmother     Social History   Tobacco Use   Smoking status: Every Day    Packs/day: 0.50    Pack years: 0.00    Types: Cigarettes    Last attempt to quit: 09/29/2020    Years since quitting: 0.3   Smokeless tobacco: Never  Vaping Use   Vaping Use: Never used  Substance Use Topics   Alcohol use: No    Alcohol/week: 0.0 standard drinks   Drug use: Not Currently    Types: Methamphetamines    Home Medications Prior to Admission medications   Medication Sig Start Date End Date Taking? Authorizing Provider  oxyCODONE (ROXICODONE) 5 MG immediate release tablet Take 1 tablet (5 mg total) by mouth every 6 (six) hours as needed for up to 20 doses for severe pain. 02/01/21  Yes Nasreen Goedecke, DO  acetaminophen (TYLENOL) 500 MG tablet Take 500 mg by mouth every 6 (six) hours as needed. States he is taking one to two tablets a day    [provider]  amLODipine (NORVASC) 10 MG tablet Take 1 tablet (10 mg total) by mouth daily. 01/26/21   [name], DO  DULoxetine (CYMBALTA) 30 MG capsule Take 1 capsule (30 mg total) by mouth daily. Patient taking differently: Take 30 mg by mouth 3 (three) times a week. 07/19/20   [name] R, DO  furosemide (LASIX) 20 MG tablet TAKE 1 TABLET(20 MG) BY MOUTH DAILY 12/21/20   Carollee Herter, Alferd Apa, DO  gabapentin (NEURONTIN) 300 MG capsule Take 2 tablets tid 01/23/21   [name], DO  hydrALAZINE (APRESOLINE) 50 MG tablet Take 1 tablet (50 mg total) by mouth every 8 (eight) hours. 01/26/21   [name], DO  ibuprofen (ADVIL) 200 MG tablet Take 200 mg by mouth every 6 (six) hours as needed.    [provider]  insulin aspart (NOVOLOG FLEXPEN) 100 UNIT/ML FlexPen Inject 12  Units into the skin 3 (three) times daily with meals. 01/03/21   [name], Melanie Crazier, MD  insulin degludec (TRESIBA FLEXTOUCH) 200 UNIT/ML FlexTouch Pen Inject 46 Units into the skin daily. 01/03/21   [name], Melanie Crazier, MD  Insulin Pen Needle 31G X 5 MM MISC 1 Device by Does not apply route in the morning, at noon, in the evening, and at bedtime. 01/03/21   [name], Melanie Crazier, MD  losartan (COZAAR) 100 MG tablet Take 1 tablet (100 mg total) by mouth daily. 01/26/21   [name], DO  meloxicam (MOBIC) 7.5 MG tablet Take 1 tablet (7.5  mg total) by mouth daily. 01/25/21   [name] R, DO  Menthol, Topical Analgesic, (ICY HOT EX) Apply topically as needed.    [provider]  metFORMIN (GLUCOPHAGE-XR) 500 MG 24 hr tablet Take 2 tablets (1,000 mg total) by mouth in the morning and at bedtime. 01/26/21   [name] R, DO  methocarbamol (ROBAXIN) 500 MG tablet TAKE ONE TABLET BY MOUTH FOUR TIMES A DAY 01/11/21   Carollee Herter, Alferd Apa, DO  pravastatin (PRAVACHOL) 40 MG tablet Take 1 tablet (40 mg total) by mouth daily. 01/26/21   Carollee Herter, Kendrick Fries R, DO  predniSONE (DELTASONE) 10 MG tablet TAKE 3 TABLETS PO QD FOR 3 DAYS THEN TAKE 2 TABLETS PO QD FOR 3 DAYS THEN TAKE 1 TABLET PO QD FOR 3 DAYS THEN TAKE 1/2 TAB PO QD FOR 3 DAYS 01/24/21   [name], DO  tamsulosin (FLOMAX) 0.4 MG CAPS capsule Take 1 capsule (0.4 mg total) by mouth daily. 01/26/21   [name], DO  traMADol (ULTRAM) 50 MG tablet TAKE ONE TO TWO TABLETS BY MOUTH EVERY 6 HOURS AS NEEDED FOR PAIN 01/25/21   [name], DO    Allergies    Patient has no known allergies.  Review of Systems   Review of Systems  Constitutional:  Negative for chills, fatigue and fever.  HENT:  Negative for ear pain and sore throat.   Eyes:  Negative for pain and visual disturbance.  Respiratory:  Negative for cough and shortness of breath.   Cardiovascular:  Positive for leg  swelling. Negative for chest pain and palpitations.  Gastrointestinal:  Negative for abdominal pain and vomiting.  Genitourinary:  Negative for dysuria and hematuria.  Musculoskeletal:  Positive for arthralgias. Negative for back pain, neck pain and stiffness.  Skin:  Negative for color change, itching and rash.  Neurological:  Negative for seizures and syncope.  All other systems reviewed and are negative.  Physical Exam Updated Vital Signs BP (!) 151/74   Pulse 84   Temp 97.8 F (36.6 C) (Oral)   Resp 20   Ht 5\' 11"  (1.803 m)   Wt 126.1 kg   SpO2 100%   BMI 38.77 kg/m   Physical Exam Vitals and nursing note reviewed.  Constitutional:      General: He is not in acute distress.    Appearance: He is well-developed. He is not ill-appearing.  HENT:     Head: Normocephalic and atraumatic.     Nose: Nose normal.     Mouth/Throat:     Mouth: Mucous membranes are moist.  Eyes:     Extraocular Movements: Extraocular movements intact.     Conjunctiva/sclera: Conjunctivae normal.     Pupils: Pupils are equal, round, and reactive to light.  Cardiovascular:     Rate and Rhythm: Normal rate and regular rhythm.     Pulses: Normal pulses.     Heart sounds: No murmur heard. Pulmonary:     Effort: Pulmonary effort is normal. No respiratory distress.     Breath sounds: Normal breath sounds.  Abdominal:     Palpations: Abdomen is soft.     Tenderness: There is no abdominal tenderness.  Musculoskeletal:        General: Swelling and tenderness present.     Cervical back: Normal range of motion and neck supple.     Comments: Tenderness throughout his legs to palpation with some 1+ swelling bilaterally  Skin:  General: Skin is warm and dry.  Neurological:     General: No focal deficit present.     Mental Status: He is alert and oriented to person, place, and time.     Sensory: No sensory deficit.     Motor: No weakness.    ED Results / Procedures / Treatments   Labs (all labs  ordered are listed, but only abnormal results are displayed) Labs Reviewed - No data to display  EKG None  Radiology No results found.  Procedures Procedures   Medications Ordered in ED Medications  ketorolac (TORADOL) injection 60 mg (60 mg Intramuscular Given 02/01/21 0947)  oxyCODONE (Oxy IR/ROXICODONE) immediate release tablet 5 mg (5 mg Oral Given 02/01/21 0946)    ED Course  I have reviewed the triage vital signs and the nursing notes.  Pertinent labs & imaging results that were available during my care of the patient were reviewed by me and considered in my medical decision making (see chart for details).    MDM Rules/Calculators/A&P                          Tierra Divelbiss is here for chronic leg pain.  Normal vitals.  No fever.  States that his legs are hurting him more despite taking tramadol Tylenol and ibuprofen at home.  History of CIDP, CAD, diabetes.  Neurovascularly intact on exam.  No signs of infection in his legs.  He has 1+ swelling in his legs that appears chronic.  No respiratory symptoms.  No concern for heart failure exacerbation.  No concern for blood clot.  States that the swelling in his leg is much better today compared to yesterday.  States that he gets stronger narcotics for breakthrough pain.  Has pain management appointment, not given that his pain is not being well controlled with primary doctor.  Will prescribe oxycodone.  Discharged in good condition.  This chart was dictated using voice recognition software.  Despite best efforts to proofread,  errors can occur which can change the documentation meaning.  Final Clinical Impression(s) / ED Diagnoses Final diagnoses:  Bilateral leg pain    Rx / DC Orders ED Discharge Orders          Ordered    oxyCODONE (ROXICODONE) 5 MG immediate release tablet  Every 6 hours PRN        02/01/21 0957             Lennice Sites, DO 02/01/21 1001

## 2021-02-01 NOTE — Discharge Instructions (Addendum)
Recommend continued use of Tylenol and ibuprofen at home for pain.  Use Roxicodone for breakthrough pain.  Consider taking extra dose of Lasix as needed.

## 2021-02-01 NOTE — ED Triage Notes (Signed)
Pt arrives via [facility] from home w/ c/o chronic leg pain has upcoming appointment with pain management MD states to EMS he is need of medication to better manage his pain .   190/110 with EMS pt reports not taking his BP medication this morning  HR 84, 98% RA with EMS

## 2021-02-02 ENCOUNTER — Telehealth: Payer: Self-pay | Admitting: Family Medicine

## 2021-02-02 NOTE — Telephone Encounter (Signed)
Caller: Cara ([place]) Call back # [phone]  Verbal Order  PT  2 times a week for 2 weeks 1 time a week till 02/19/21

## 2021-02-03 ENCOUNTER — Ambulatory Visit (INDEPENDENT_AMBULATORY_CARE_PROVIDER_SITE_OTHER): Payer: Medicare HMO | Admitting: Family Medicine

## 2021-02-03 ENCOUNTER — Other Ambulatory Visit: Payer: Self-pay

## 2021-02-03 VITALS — BP 90/60 | HR 90 | Temp 98.2°F | Resp 18 | Ht 71.0 in | Wt 278.0 lb

## 2021-02-03 DIAGNOSIS — R531 Weakness: Secondary | ICD-10-CM | POA: Diagnosis not present

## 2021-02-03 DIAGNOSIS — L4050 Arthropathic psoriasis, unspecified: Secondary | ICD-10-CM | POA: Diagnosis not present

## 2021-02-03 DIAGNOSIS — I959 Hypotension, unspecified: Secondary | ICD-10-CM | POA: Diagnosis not present

## 2021-02-03 DIAGNOSIS — G8929 Other chronic pain: Secondary | ICD-10-CM

## 2021-02-03 DIAGNOSIS — G6181 Chronic inflammatory demyelinating polyneuritis: Secondary | ICD-10-CM | POA: Diagnosis not present

## 2021-02-03 DIAGNOSIS — R609 Edema, unspecified: Secondary | ICD-10-CM | POA: Diagnosis not present

## 2021-02-03 DIAGNOSIS — M545 Low back pain, unspecified: Secondary | ICD-10-CM | POA: Diagnosis not present

## 2021-02-03 MED ORDER — OXYCODONE HCL 5 MG PO TABS: 5.0000 mg | ORAL_TABLET | Freq: Four times a day (QID) | ORAL | 0 refills | Status: DC | PRN

## 2021-02-03 MED ORDER — TRAMADOL HCL 50 MG PO TABS: ORAL_TABLET | ORAL | 1 refills | Status: DC

## 2021-02-03 NOTE — Telephone Encounter (Signed)
Tyler Brewer made aware.

## 2021-02-03 NOTE — Progress Notes (Addendum)
Patient ID: Tyler Brewer, male    DOB: 12/04/1960  Age: 60 y.o. MRN: [id number]    Subjective:  Subjective  HPI Tyler Brewer presents for an office visit today. He complains of myalgias. He endorses taking 10 mg of predniSONE PO Daily, oxycodone, and 50 mg of traMADol for pain. He reports that his pain medications help relieved his pain greatly.  He also complains of constipation. He notes that his pain management medications had caused his to be constipated.  He also complains of sleep disturbance secondary to pain.  He also complains of balance difficulty. He reports that his balance and endurance has worsen over time, however his strength had improved. He notes attending PT--- is is having a mobility exam next week.   He also complains of visual disturbance. He notes seeing glares, while at the office---  he had to stand outside for 30 min waiting for transportation to get him and he got over heated.   He states that he doesn't check his BP regular. BP Readings from Last 3 Encounters:  02/09/21 (!) 169/87  02/04/21 (!) 188/77  02/03/21 90/60  He reports that his glucose level is within range 76% of the time.  Lab Results  Component Value Date   HGBA1C 9.9 (A) 01/03/2021  He denies any chest pain, SOB, fever, abdominal pain, cough, chills, sore throat, dysuria, urinary incontinence, back pain, HA, or N/V/D at this time. Pt was out in the heat for 20 minutes before transportation arrived. He is suffering from financial and roommates difficulty.   Review of Systems  Constitutional:  Negative for chills, fatigue and fever.  HENT:  Negative for ear pain, rhinorrhea, sinus pressure, sinus pain, sore throat and tinnitus.   Eyes:  Negative for pain and visual disturbance.  Respiratory:  Negative for cough, shortness of breath and wheezing.   Cardiovascular:  Negative for chest pain.  Gastrointestinal:  Positive for constipation. Negative for abdominal pain, anal bleeding, diarrhea,  nausea and vomiting.  Musculoskeletal:  Positive for arthralgias, back pain and myalgias.  Skin:  Negative for rash.  Neurological:  Negative for seizures, weakness, light-headedness, numbness and headaches.       (+) balance difficulty    Psychiatric/Behavioral:  Positive for sleep disturbance. Negative for self-injury. The patient is not nervous/anxious.    History Past Medical History:  Diagnosis Date   Arthritis    CAD (coronary artery disease)  cardiac cath with moderate disease in a septal branch of the ramus intermedius 04/01/2012   Depression    Diabetes mellitus    poorly controlled by his report   History of narcotic addiction ([place])    past history of back pain   Hypercholesteremia    Hypertension    IBS (irritable bowel syndrome)    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[place])    Neuropathy    Obesity    Max weight was 390   OSA on CPAP    Panic attacks    Testosterone deficiency    Vertigo     He has a past surgical history that includes Cardiac catheterization; left heart catheterization with coronary angiogram (N/A, 03/31/2012); IR Fluoro Guide CV Line Right (11/10/2020); and IR US Guide Vasc Access Right (11/10/2020).   His family history includes Diabetes type II in his father; Healthy in his daughter, mother, sister, and son; Hypertension in his father; Pancreatic disease (age of onset: 74) in his father; Parkinson's disease in his maternal grandmother.He reports that he has been smoking cigarettes. He has been smoking  an average of 0.50 packs per day. He has never used smokeless tobacco. He reports previous drug use. Drug: Methamphetamines. He reports that he does not drink alcohol.  Current Outpatient Medications on File Prior to Visit  Medication Sig Dispense Refill   acetaminophen (TYLENOL) 500 MG tablet Take 500 mg by mouth every 6 (six) hours as needed. States he is taking one to two tablets a day     amLODipine (NORVASC) 10 MG tablet Take 1 tablet (10 mg total) by mouth  daily. 90 tablet 1   DULoxetine (CYMBALTA) 30 MG capsule Take 1 capsule (30 mg total) by mouth daily. (Patient taking differently: Take 30 mg by mouth 3 (three) times a week.) 60 capsule 3   furosemide (LASIX) 20 MG tablet TAKE 1 TABLET(20 MG) BY MOUTH DAILY 90 tablet 0   gabapentin (NEURONTIN) 300 MG capsule Take 2 tablets tid 180 capsule 5   hydrALAZINE (APRESOLINE) 50 MG tablet Take 1 tablet (50 mg total) by mouth every 8 (eight) hours. 90 tablet 0   ibuprofen (ADVIL) 200 MG tablet Take 200 mg by mouth every 6 (six) hours as needed.     insulin aspart (NOVOLOG FLEXPEN) 100 UNIT/ML FlexPen Inject 12 Units into the skin 3 (three) times daily with meals. 30 mL 2   insulin degludec (TRESIBA FLEXTOUCH) 200 UNIT/ML FlexTouch Pen Inject 46 Units into the skin daily. 30 mL 2   Insulin Pen Needle 31G X 5 MM MISC 1 Device by Does not apply route in the morning, at noon, in the evening, and at bedtime. 400 each 2   losartan (COZAAR) 100 MG tablet Take 1 tablet (100 mg total) by mouth daily. 90 tablet 3   meloxicam (MOBIC) 7.5 MG tablet Take 1 tablet (7.5 mg total) by mouth daily. 30 tablet 0   Menthol, Topical Analgesic, (ICY HOT EX) Apply topically as needed.     metFORMIN (GLUCOPHAGE-XR) 500 MG 24 hr tablet Take 2 tablets (1,000 mg total) by mouth in the morning and at bedtime. 360 tablet 1   methocarbamol (ROBAXIN) 500 MG tablet TAKE ONE TABLET BY MOUTH FOUR TIMES A DAY 45 tablet 1   pravastatin (PRAVACHOL) 40 MG tablet Take 1 tablet (40 mg total) by mouth daily. 90 tablet 1   predniSONE (DELTASONE) 10 MG tablet TAKE 3 TABLETS PO QD FOR 3 DAYS THEN TAKE 2 TABLETS PO QD FOR 3 DAYS THEN TAKE 1 TABLET PO QD FOR 3 DAYS THEN TAKE 1/2 TAB PO QD FOR 3 DAYS 20 tablet 0   tamsulosin (FLOMAX) 0.4 MG CAPS capsule Take 1 capsule (0.4 mg total) by mouth daily. 90 capsule 3   No current facility-administered medications on file prior to visit.     Objective:  Objective  Physical Exam Vitals and nursing note  reviewed.  Constitutional:      General: He is not in acute distress.    Appearance: Normal appearance. He is well-developed. He is not ill-appearing.  HENT:     Head: Normocephalic and atraumatic.     Right Ear: External ear normal.     Left Ear: External ear normal.     Nose: Nose normal.  Eyes:     General:        Right eye: No discharge.        Left eye: No discharge.     Extraocular Movements: Extraocular movements intact.     Pupils: Pupils are equal, round, and reactive to light.  Cardiovascular:     Rate  and Rhythm: Normal rate and regular rhythm.     Pulses: Normal pulses.     Heart sounds: Normal heart sounds. No murmur heard.   No friction rub. No gallop.  Pulmonary:     Effort: Pulmonary effort is normal. No respiratory distress.     Breath sounds: Normal breath sounds. No stridor. No wheezing, rhonchi or rales.  Chest:     Chest wall: No tenderness.  Abdominal:     General: There is distension.  Musculoskeletal:        General: Tenderness present.     Cervical back: Normal range of motion and neck supple.     Right lower leg: No edema.     Left lower leg: No edema.  Skin:    General: Skin is warm and dry.  Neurological:     Mental Status: He is alert and oriented to person, place, and time.  Psychiatric:        Behavior: Behavior normal.        Thought Content: Thought content normal.   BP 90/60 (BP Location: Left Arm, Patient Position: Sitting, Cuff Size: Large)   Pulse 90   Temp 98.2 F (36.8 C) (Oral)   Resp 18   Ht 5\' 11"  (1.803 m)   Wt 278 lb (126.1 kg)   SpO2 97%   BMI 38.77 kg/m  Wt Readings from Last 3 Encounters:  02/09/21 278 lb (126.1 kg)  02/04/21 278 lb (126.1 kg)  02/03/21 278 lb (126.1 kg)     Lab Results  Component Value Date   WBC 7.3 01/20/2021   HGB 13.1 01/20/2021   HCT 38.7 (L) 01/20/2021   PLT 277 01/20/2021   GLUCOSE 185 (H) 01/20/2021   CHOL 118 11/28/2020   TRIG 130.0 11/28/2020   HDL 40.00 11/28/2020   LDLDIRECT  124.0 05/02/2017   LDLCALC 52 11/28/2020   ALT 18 01/20/2021   AST 16 01/20/2021   NA 136 01/20/2021   K 3.8 01/20/2021   CL 102 01/20/2021   CREATININE 0.98 01/20/2021   BUN 24 (H) 01/20/2021   CO2 27 01/20/2021   TSH 2.948 11/05/2020   PSA 1.15 03/10/2020   INR 1.1 07/30/2013   HGBA1C 9.9 (A) 01/03/2021   MICROALBUR <0.7 05/02/2017    No results found.   Assessment & Plan:  Plan   Meds ordered this encounter  Medications   traMADol (ULTRAM) 50 MG tablet    Sig: 2 po q6 h prn    Dispense:  120 tablet    Refill:  1   oxyCODONE (ROXICODONE) 5 MG immediate release tablet    Sig: Take 1 tablet (5 mg total) by mouth every 6 (six) hours as needed for up to 20 doses for severe pain.    Dispense:  45 tablet    Refill:  0    Problem List Items Addressed This Visit       Unprioritized   Chronic low back pain   Relevant Medications   traMADol (ULTRAM) 50 MG tablet   oxyCODONE (ROXICODONE) 5 MG immediate release tablet   Psoriatic arthritis ([facility])   Relevant Medications   traMADol (ULTRAM) 50 MG tablet   oxyCODONE (ROXICODONE) 5 MG immediate release tablet   Other Relevant Orders   Ambulatory referral to Rheumatology   CIDP (chronic inflammatory demyelinating polyneuropathy) ([place]) - Primary        Pt has second opinion pending          Follow-up: Return if symptoms worsen or  fail to improve.   I,Tyler Brewer,acting as a Education administrator for [employer], DO.,have documented all relevant documentation on the behalf of Tyler Held, DO,as directed by  Tyler Held, DO while in the presence of Jack, DO, have reviewed all documentation for this visit. The documentation on 02/12/21 for the exam, diagnosis, procedures, and orders are all accurate and complete.

## 2021-02-03 NOTE — Patient Instructions (Signed)
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 Normally, nerves are surrounded and protected by a fatty covering called the myelin sheath. The myelin sheath helps the nerves conduct electricity. Damage to the myelin can lead to a short in the system and failure of the nerves to work correctly. This condition happens when the body's defense system (immune system) damages the myelin sheath. This is called an autoimmune condition. CIDP is associated with a disorder called Guillain-Barr syndrome, but it can also occur along with autoimmune conditions or other conditions such as diabetes, HIV (human immunodeficiency virus), hepatitis B or C infection, Waldenstrm macroglobulinemia, multiple myeloma,and osteosclerotic myeloma. What are the causes? This condition occurs when the body's immune system accidentally attacks themyelin sheath. What are the signs or symptoms? Symptoms of this condition include: Numbness or tingling of the hands and feet. Weakness, especially in the hips, shoulders, hands, and feet. Problems walking. Weak or absent reflexes. How is this diagnosed? This condition may be diagnosed with: A physical exam. A spinal tap (lumbar puncture) to check for inflammation in the spinal fluid (cerebrospinal fluid). Tests that show how well the nerves are working, such as: Nerve conduction studies. An electromyogram. Blood tests to look for autoimmune conditions, blood cancers, and infections. How is this treated? This condition may be treated with: Immunoglobulin therapy. This treatment helps prevent your immune system from attacking your myelin sheath. Medicines that suppress the immune system, such as steroids. Blood plasma exchange (plasmapheresis). This can be done to remove harmful immune system elements from your  blood. Physical therapy. This may help improve your strength and balance. Braces. These may be used to help support a weakened arm or leg. Follow these instructions at home: If you have a brace: Wear it as told by your health care provider. Remove it only as told by your health care provider. Loosen it if your fingers or toes tingle, become numb, or turn cold and blue. Keep it clean. If the brace is not waterproof: Do not let it get wet. Cover it with a watertight covering when you take a bath or shower. General instructions Take over-the-counter and prescription medicines only as told by your health care provider. Return to your normal activities as told by your health care provider. Ask your health care provider what activities are safe for you. Do exercises as told by your health care provider. Keep all follow-up visits as told by your health care provider. This is important. Where to find more information National Organization for Rare Disorders: https://rarediseases.org/rare-diseases/ Contact a health care provider if: You have new symptoms. Your symptoms get worse. Get help right away if: You have an allergic reaction to any of the medicines you are taking. You develop new weakness, difficulty swallowing, or the inability to walk. You have shortness of breath. These symptoms may represent a serious problem that is an emergency. Do not wait to see if the symptoms will go away. Get medical help right away. Call your local emergency services (911 in the [place] Do not drive yourself to the hospital. Summary CIDP is a disease that affects the myelin sheath around the nerves in the hands and feet. CIDP is associated with a disorder called Guillain-Barr syndrome. It can also occur along with autoimmune conditions or other conditions such as diabetes, HIV (human immunodeficiency virus), hepatitis B or C infection, Waldenstrm macroglobulinemia, multiple myeloma,  and osteosclerotic  myeloma. This condition may be diagnosed with tests such as a spinal tap (lumbar puncture), nerve conduction studies, and an electromyogram. Treatment can include medicines, blood plasma exchange, physical therapy, and braces. This information is not intended to replace advice given to you by your health care provider. Make sure you discuss any questions you have with your healthcare provider. Document Revised: 06/29/2019 Document Reviewed: 06/29/2019 Elsevier Patient Education  2022 [employer].

## 2021-02-04 ENCOUNTER — Encounter (HOSPITAL_BASED_OUTPATIENT_CLINIC_OR_DEPARTMENT_OTHER): Payer: Self-pay | Admitting: *Deleted

## 2021-02-04 ENCOUNTER — Emergency Department (HOSPITAL_BASED_OUTPATIENT_CLINIC_OR_DEPARTMENT_OTHER)
Admission: EM | Admit: 2021-02-04 | Discharge: 2021-02-04 | Disposition: A | Discharge status: Home / Self Care | Payer: Medicare HMO | Attending: Emergency Medicine | Admitting: Emergency Medicine

## 2021-02-04 ENCOUNTER — Emergency Department (HOSPITAL_BASED_OUTPATIENT_CLINIC_OR_DEPARTMENT_OTHER): Payer: Medicare HMO

## 2021-02-04 ENCOUNTER — Other Ambulatory Visit: Payer: Self-pay

## 2021-02-04 DIAGNOSIS — E1142 Type 2 diabetes mellitus with diabetic polyneuropathy: Secondary | ICD-10-CM | POA: Diagnosis not present

## 2021-02-04 DIAGNOSIS — Z79899 Other long term (current) drug therapy: Secondary | ICD-10-CM | POA: Diagnosis not present

## 2021-02-04 DIAGNOSIS — K5900 Constipation, unspecified: Secondary | ICD-10-CM | POA: Insufficient documentation

## 2021-02-04 DIAGNOSIS — I10 Essential (primary) hypertension: Secondary | ICD-10-CM | POA: Insufficient documentation

## 2021-02-04 DIAGNOSIS — F17210 Nicotine dependence, cigarettes, uncomplicated: Secondary | ICD-10-CM | POA: Diagnosis not present

## 2021-02-04 DIAGNOSIS — Z7984 Long term (current) use of oral hypoglycemic drugs: Secondary | ICD-10-CM | POA: Insufficient documentation

## 2021-02-04 DIAGNOSIS — Z794 Long term (current) use of insulin: Secondary | ICD-10-CM | POA: Insufficient documentation

## 2021-02-04 DIAGNOSIS — I2510 Atherosclerotic heart disease of native coronary artery without angina pectoris: Secondary | ICD-10-CM | POA: Diagnosis not present

## 2021-02-04 DIAGNOSIS — K641 Second degree hemorrhoids: Secondary | ICD-10-CM | POA: Diagnosis not present

## 2021-02-04 DIAGNOSIS — K5903 Drug induced constipation: Secondary | ICD-10-CM | POA: Diagnosis not present

## 2021-02-04 DIAGNOSIS — K649 Unspecified hemorrhoids: Secondary | ICD-10-CM | POA: Diagnosis not present

## 2021-02-04 DIAGNOSIS — Z743 Need for continuous supervision: Secondary | ICD-10-CM | POA: Diagnosis not present

## 2021-02-04 DIAGNOSIS — R69 Illness, unspecified: Secondary | ICD-10-CM | POA: Diagnosis not present

## 2021-02-04 IMAGING — CR DG ABDOMEN ACUTE W/ 1V CHEST
3 series · 3 of 3 positions shown · non-contrast
Comparison: None.

CLINICAL DATA: Constipation

EXAM:
DG ABDOMEN ACUTE WITH 1 VIEW CHEST

[w abdomen upright]
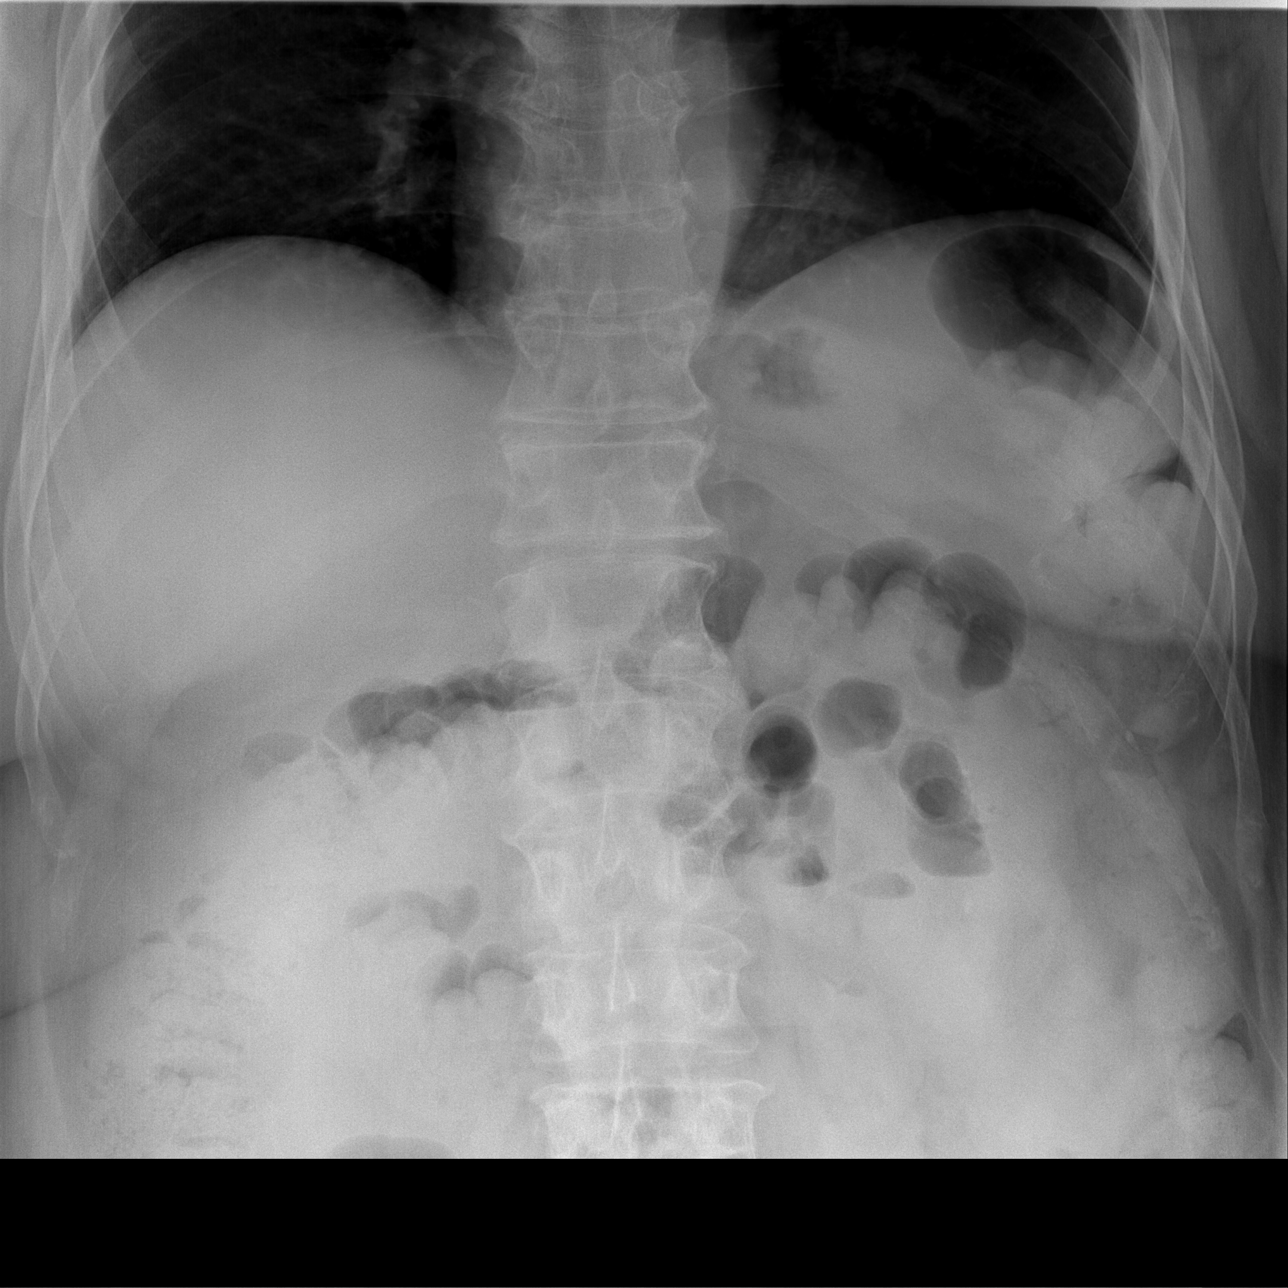

[t abdomen supine (1 of 2)]
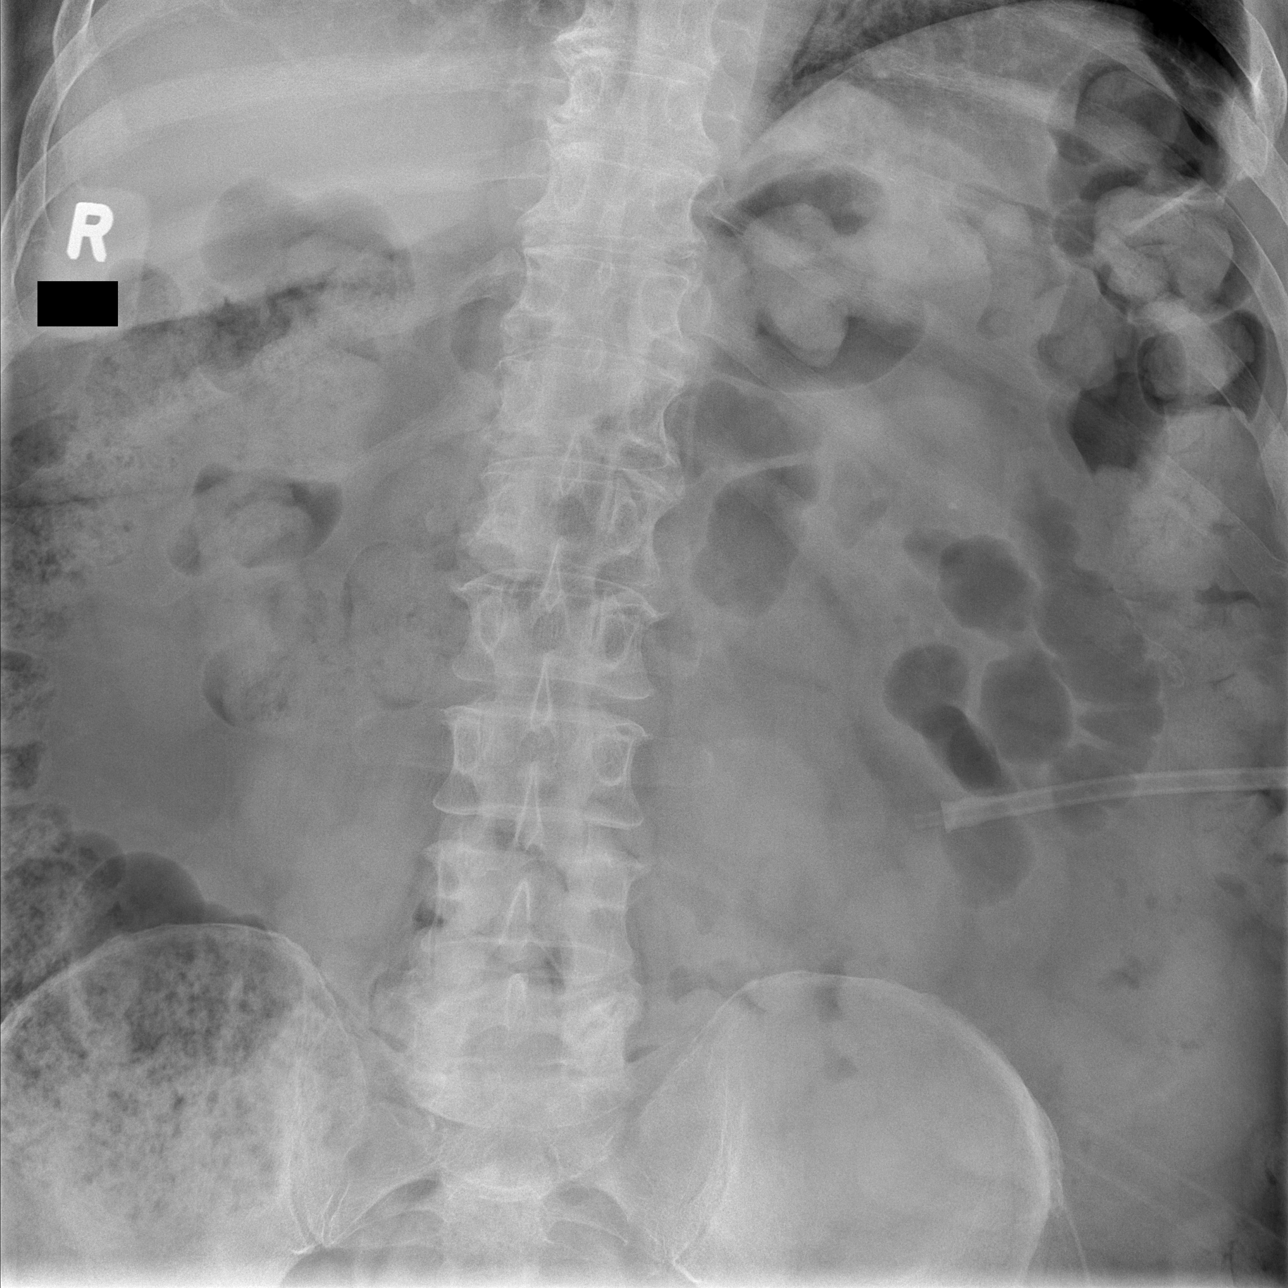

[t abdomen supine (2 of 2)]
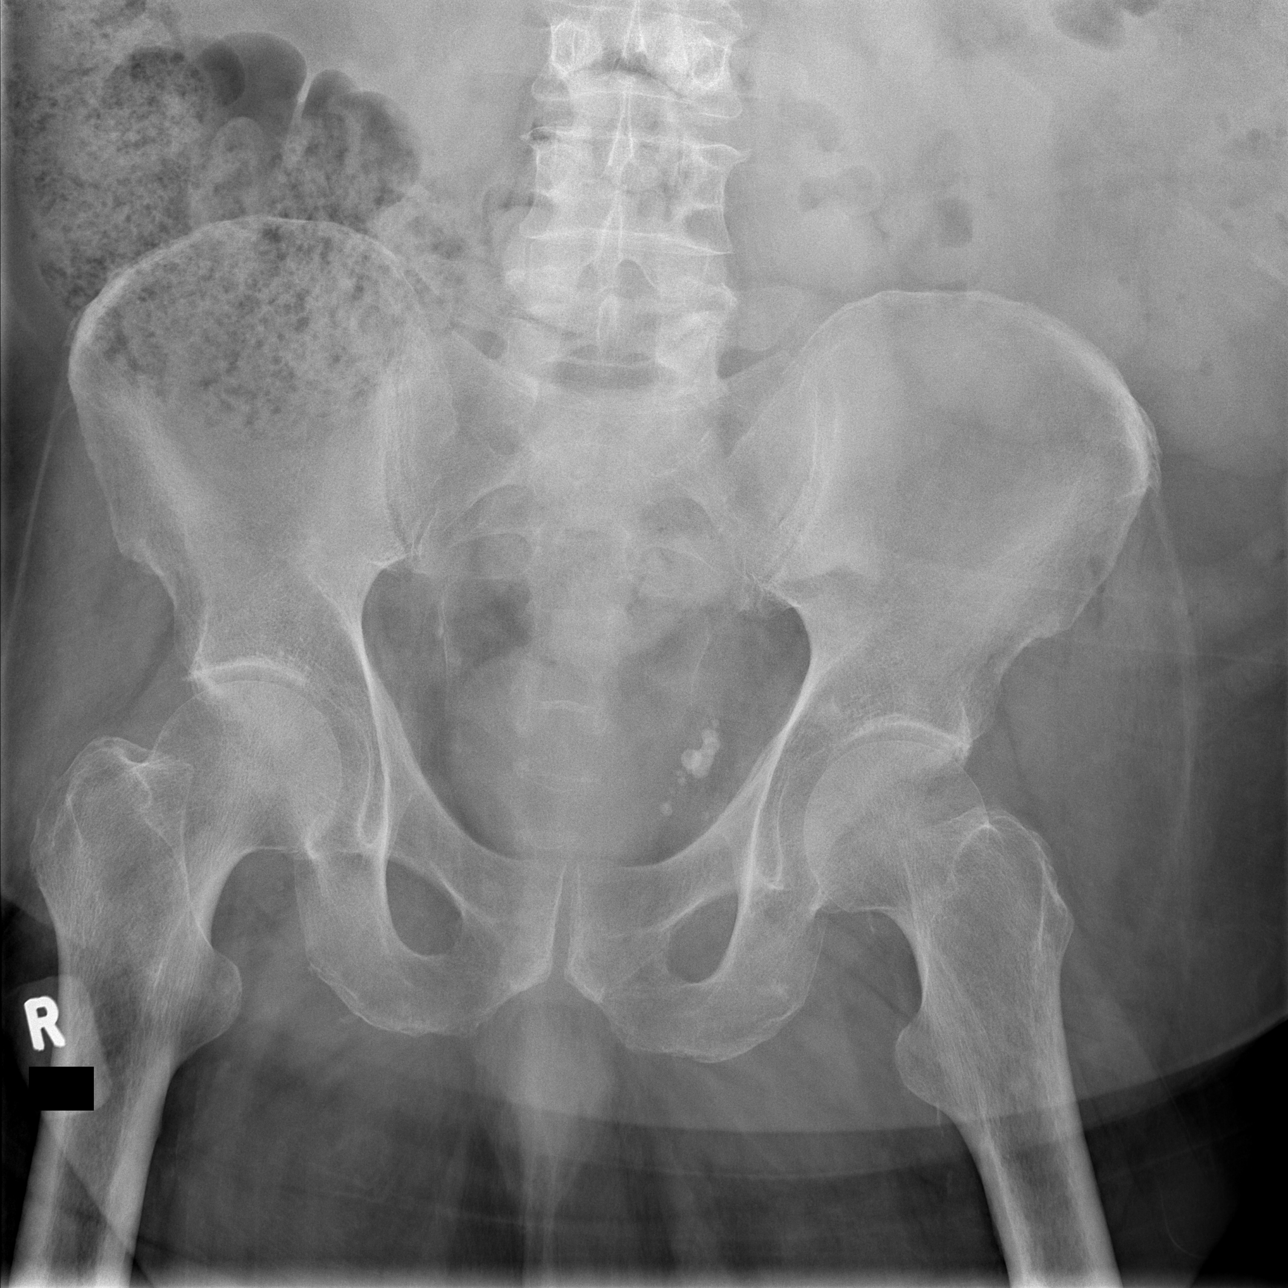

[3 of 3 positions shown; findings below may reference images not displayed]

FINDINGS: There is no evidence of dilated bowel loops or free intraperitoneal
air. Large amount of proximal colonic stool. No radiopaque calculi
or other significant radiographic abnormality is seen. Heart size
and mediastinal contours are within normal limits. Both lungs are
clear.
IMPRESSION: Negative abdominal radiographs.  No acute cardiopulmonary disease.

## 2021-02-04 IMAGING — CR DG ABDOMEN ACUTE W/ 1V CHEST
1 series · 1 of 1 positions shown · non-contrast
Comparison: None.

CLINICAL DATA: Constipation

EXAM:
DG ABDOMEN ACUTE WITH 1 VIEW CHEST

[w chest pa]
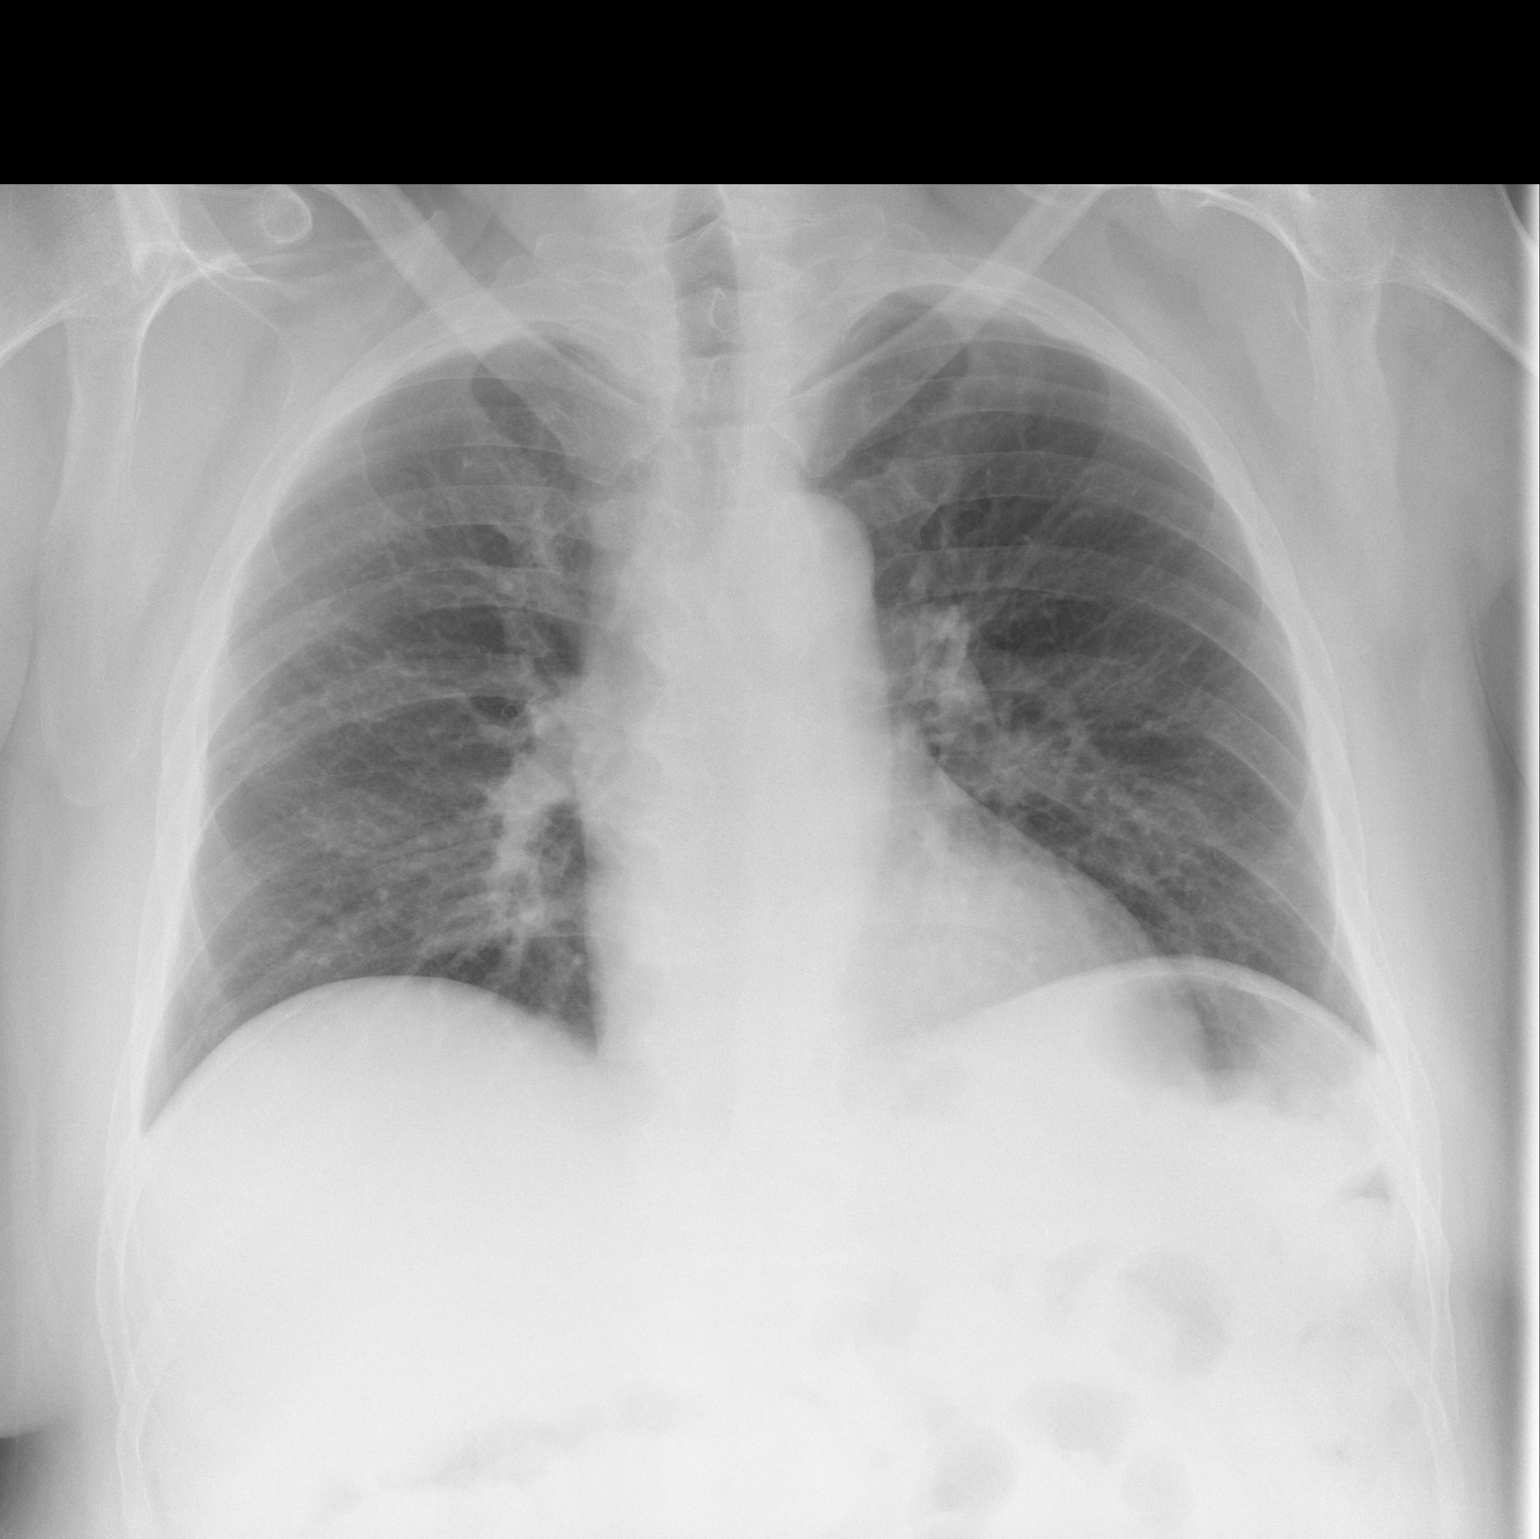

[1 of 1 positions shown; findings below may reference images not displayed]

FINDINGS: There is no evidence of dilated bowel loops or free intraperitoneal
air. Large amount of proximal colonic stool. No radiopaque calculi
or other significant radiographic abnormality is seen. Heart size
and mediastinal contours are within normal limits. Both lungs are
clear.
IMPRESSION: Negative abdominal radiographs.  No acute cardiopulmonary disease.

## 2021-02-04 MED ORDER — DOCUSATE SODIUM 100 MG PO CAPS
200.0000 mg | ORAL_CAPSULE | Freq: Once | ORAL | Status: AC
  Administered 2021-02-04: 200 mg via ORAL
  Filled 2021-02-04: qty 2

## 2021-02-04 NOTE — ED Provider Notes (Signed)
[facility] Provider Note   CSN: [id number] Arrival date & time: 02/04/21  0031     History Chief Complaint  Patient presents with   Constipation    Tyler Brewer is a 60 y.o. male.  The history is provided by the patient.  Constipation Severity:  Moderate Time since last bowel movement: 3 days. Timing:  Constant Progression:  Unchanged Chronicity:  Recurrent Context: narcotics   Stool description:  None produced Relieved by:  Nothing Worsened by:  Narcotic pain medications Ineffective treatments:  None tried Associated symptoms: no abdominal pain, no anorexia, no fever, no nausea, no urinary retention and no vomiting   Associated symptoms comment:  Rectal pain tonight  Risk factors: no change in medication   Took his home oxycodone and bottom no longer hurts.  Has not taken anything for constipation.      Past Medical History:  Diagnosis Date   Arthritis    CAD (coronary artery disease)  cardiac cath with moderate disease in a septal branch of the ramus intermedius 04/01/2012   Depression    Diabetes mellitus    poorly controlled by his report   History of narcotic addiction ([place])    past history of back pain   Hypercholesteremia    Hypertension    IBS (irritable bowel syndrome)    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[place])    Neuropathy    Obesity    Max weight was 390   OSA on CPAP    Panic attacks    Testosterone deficiency    Vertigo     Patient Active Problem List   Diagnosis Date Noted   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   Diabetes mellitus ([place]) 01/03/2021   Type 2 diabetes mellitus with hyperglycemia, with long-term current use of insulin ([place]) 01/03/2021   Lower extremity edema 11/28/2020   Chronic low back pain 11/28/2020   Psoriatic arthritis ([place]) 11/28/2020   Thoracic aortic aneurysm without rupture ([place]) 11/28/2020   Bilateral leg weakness 11/05/2020   Acute left-sided low  back pain with right-sided sciatica 07/19/2020   Hip pain, acute, left 07/05/2020   Tremor 07/05/2020   Weakness 07/05/2020   Balance problem 03/11/2020   Tinea cruris 03/11/2020   Cellulitis of left groin 03/11/2020   Acute pain of right shoulder 03/11/2020   Interstitial lung disease ([place]) 05/02/2017   Pansinusitis 09/26/2016   Diabetic polyneuropathy associated with diabetes mellitus due to underlying condition ([place]) 05/08/2016   Methamphetamine use disorder, severe, dependence ([place]) 02/11/2016   Substance induced mood disorder ([place]) 02/11/2016   Exertional dyspnea 11/30/2015   ADD (attention deficit disorder) 09/29/2015   Binge eating 09/29/2015   Syncope 05/05/2012   Diarrhea 05/05/2012   Panic attacks 05/05/2012   OSA (obstructive sleep apnea) 05/05/2012   HTN (hypertension) 05/05/2012   Dyslipidemia 05/05/2012   CAD (coronary artery disease)  cardiac cath with moderate disease in a septal branch of the ramus intermedius 04/01/2012   Chest pain 04/01/2012   Drug abuse and dependence ([place]) 04/01/2012   Family history of coronary artery disease 03/31/2012   Sleep apnea, on C-pap 03/31/2012   Hyperlipemia 03/30/2012   HTN (hypertension), benign 03/30/2012   Morbid obesity ([place]) 03/30/2012   DM type 2, uncontrolled, with neuropathy ([place]) 03/30/2012   Depression with suicidal ideation 03/30/2012    Past Surgical History:  Procedure Laterality Date   CARDIAC CATHETERIZATION     IR FLUORO GUIDE CV LINE RIGHT  11/10/2020   IR US GUIDE VASC  ACCESS RIGHT  11/10/2020   LEFT HEART CATHETERIZATION WITH CORONARY ANGIOGRAM N/A 03/31/2012   Procedure: LEFT HEART CATHETERIZATION WITH CORONARY ANGIOGRAM;  Surgeon: Leonie Man, MD;  Location: [facility] CATH LAB;  Service: Cardiovascular;  Laterality: N/A;       Family History  Problem Relation Age of Onset   Diabetes type II Father    Hypertension Father    Pancreatic disease Father 18       Deceased   Healthy Mother    Healthy  Sister    Healthy Son    Healthy Daughter    Parkinson's disease Maternal Grandmother     Social History   Tobacco Use   Smoking status: Every Day    Packs/day: 0.50    Pack years: 0.00    Types: Cigarettes    Last attempt to quit: 09/29/2020    Years since quitting: 0.3   Smokeless tobacco: Never  Vaping Use   Vaping Use: Never used  Substance Use Topics   Alcohol use: No    Alcohol/week: 0.0 standard drinks   Drug use: Not Currently    Types: Methamphetamines    Home Medications Prior to Admission medications   Medication Sig Start Date End Date Taking? Authorizing Provider  acetaminophen (TYLENOL) 500 MG tablet Take 500 mg by mouth every 6 (six) hours as needed. States he is taking one to two tablets a day    [provider]  amLODipine (NORVASC) 10 MG tablet Take 1 tablet (10 mg total) by mouth daily. 01/26/21   [name], DO  DULoxetine (CYMBALTA) 30 MG capsule Take 1 capsule (30 mg total) by mouth daily. Patient taking differently: Take 30 mg by mouth 3 (three) times a week. 07/19/20   [name] R, DO  furosemide (LASIX) 20 MG tablet TAKE 1 TABLET(20 MG) BY MOUTH DAILY 12/21/20   Carollee Herter, Alferd Apa, DO  gabapentin (NEURONTIN) 300 MG capsule Take 2 tablets tid 01/23/21   [name], DO  hydrALAZINE (APRESOLINE) 50 MG tablet Take 1 tablet (50 mg total) by mouth every 8 (eight) hours. 01/26/21   [name], DO  ibuprofen (ADVIL) 200 MG tablet Take 200 mg by mouth every 6 (six) hours as needed.    [provider]  insulin aspart (NOVOLOG FLEXPEN) 100 UNIT/ML FlexPen Inject 12 Units into the skin 3 (three) times daily with meals. 01/03/21   [name], Melanie Crazier, MD  insulin degludec (TRESIBA FLEXTOUCH) 200 UNIT/ML FlexTouch Pen Inject 46 Units into the skin daily. 01/03/21   [name], Melanie Crazier, MD  Insulin Pen Needle 31G X 5 MM MISC 1 Device by Does not apply route in the morning, at noon, in the evening, and  at bedtime. 01/03/21   [name], Melanie Crazier, MD  losartan (COZAAR) 100 MG tablet Take 1 tablet (100 mg total) by mouth daily. 01/26/21   [name], DO  meloxicam (MOBIC) 7.5 MG tablet Take 1 tablet (7.5 mg total) by mouth daily. 01/25/21   [name] R, DO  Menthol, Topical Analgesic, (ICY HOT EX) Apply topically as needed.    [provider]  metFORMIN (GLUCOPHAGE-XR) 500 MG 24 hr tablet Take 2 tablets (1,000 mg total) by mouth in the morning and at bedtime. 01/26/21   [name], DO  methocarbamol (ROBAXIN) 500 MG tablet TAKE ONE TABLET BY MOUTH FOUR TIMES A DAY 01/11/21   Carollee Herter, Alferd Apa, DO  oxyCODONE (ROXICODONE) 5 MG  immediate release tablet Take 1 tablet (5 mg total) by mouth every 6 (six) hours as needed for up to 20 doses for severe pain. 02/03/21   [name], DO  pravastatin (PRAVACHOL) 40 MG tablet Take 1 tablet (40 mg total) by mouth daily. 01/26/21   Carollee Herter, Kendrick Fries R, DO  predniSONE (DELTASONE) 10 MG tablet TAKE 3 TABLETS PO QD FOR 3 DAYS THEN TAKE 2 TABLETS PO QD FOR 3 DAYS THEN TAKE 1 TABLET PO QD FOR 3 DAYS THEN TAKE 1/2 TAB PO QD FOR 3 DAYS 01/24/21   [name], DO  tamsulosin (FLOMAX) 0.4 MG CAPS capsule Take 1 capsule (0.4 mg total) by mouth daily. 01/26/21   [name], DO  traMADol (ULTRAM) 50 MG tablet 2 po q6 h prn 02/03/21   [name], DO    Allergies    Patient has no known allergies.  Review of Systems   Review of Systems  Constitutional:  Negative for fever.  HENT:  Negative for drooling.   Eyes:  Negative for redness.  Respiratory:  Negative for wheezing.   Cardiovascular:  Negative for leg swelling.  Gastrointestinal:  Positive for constipation. Negative for abdominal pain, anorexia, nausea and vomiting.  Genitourinary:  Negative for difficulty urinating.  Musculoskeletal:  Negative for neck stiffness.  Skin:  Negative for rash.  Neurological:  Negative for facial  asymmetry.  Psychiatric/Behavioral:  Negative for agitation.   All other systems reviewed and are negative.  Physical Exam Updated Vital Signs BP (!) 142/78   Pulse 87   Temp 98.5 F (36.9 C) (Oral)   Resp 16   Ht 5\' 11"  (1.803 m)   Wt 126.1 kg   SpO2 100%   BMI 38.77 kg/m   Physical Exam Vitals and nursing note reviewed. Exam conducted with a chaperone present.  Constitutional:      General: He is not in acute distress.    Appearance: Normal appearance.  HENT:     Head: Normocephalic and atraumatic.     Nose: Nose normal.  Eyes:     Conjunctiva/sclera: Conjunctivae normal.     Pupils: Pupils are equal, round, and reactive to light.  Cardiovascular:     Rate and Rhythm: Normal rate and regular rhythm.     Pulses: Normal pulses.     Heart sounds: Normal heart sounds.  Pulmonary:     Effort: Pulmonary effort is normal.     Breath sounds: Normal breath sounds.  Abdominal:     General: Abdomen is flat. Bowel sounds are normal.     Palpations: Abdomen is soft.     Tenderness: There is no abdominal tenderness. There is no guarding.  Genitourinary:    Comments: Grade 2 non thrombosed hemorrhoid  Musculoskeletal:        General: Normal range of motion.     Cervical back: Normal range of motion and neck supple.  Skin:    General: Skin is warm and dry.     Capillary Refill: Capillary refill takes less than 2 seconds.  Neurological:     General: No focal deficit present.     Mental Status: He is alert and oriented to person, place, and time.  Psychiatric:        Mood and Affect: Mood normal.        Behavior: Behavior normal.    ED Results / Procedures / Treatments   Labs (all labs ordered are listed, but only abnormal results are  displayed) Labs Reviewed - No data to display  EKG None  Radiology DG Abdomen Acute W/Chest  Result Date: 02/04/2021 CLINICAL DATA:  Constipation EXAM: DG ABDOMEN ACUTE WITH 1 VIEW CHEST COMPARISON:  None. FINDINGS: There is no evidence  of dilated bowel loops or free intraperitoneal air. Large amount of proximal colonic stool. No radiopaque calculi or other significant radiographic abnormality is seen. Heart size and mediastinal contours are within normal limits. Both lungs are clear. IMPRESSION: Negative abdominal radiographs.  No acute cardiopulmonary disease. Electronically Signed   By: Ulyses Jarred M.D.   On: 02/04/2021 02:17    Procedures Procedures   Medications Ordered in ED Medications  docusate sodium (COLACE) capsule 200 mg (has no administration in time range)    ED Course  I have reviewed the triage vital signs and the nursing notes.  Pertinent labs & imaging results that were available during my care of the patient were reviewed by me and considered in my medical decision making (see chart for details).   Start Miralax and colace BID and increase fiber in diet.  Sitz baths for hemorrhoids and call your doctor.    Tyler Brewer was evaluated in Emergency Department on 02/04/2021 for the symptoms described in the history of present illness. He was evaluated in the context of the global COVID-19 pandemic, which necessitated consideration that the patient might be at risk for infection with the SARS-CoV-2 virus that causes COVID-19. Institutional protocols and algorithms that pertain to the evaluation of patients at risk for COVID-19 are in a state of rapid change based on information released by regulatory bodies including the CDC and federal and state organizations. These policies and algorithms were followed during the patient's care in the ED.   Final Clinical Impression(s) / ED Diagnoses Final diagnoses:  None    Return for intractable cough, coughing up blood, fevers > 100.4 unrelieved by medication, shortness of breath, intractable vomiting, chest pain, shortness of breath, weakness, numbness, changes in speech, facial asymmetry, abdominal pain, passing out, Inability to tolerate liquids or food, cough,  altered mental status or any concerns. No signs of systemic illness or infection. The patient is nontoxic-appearing on exam and vital signs are within normal limits. I have reviewed the triage vital signs and the nursing notes. Pertinent labs & imaging results that were available during my care of the patient were reviewed by me and considered in my medical decision making (see chart for details). After history, exam, and medical workup I feel the patient has been appropriately medically screened and is safe for discharge home. Pertinent diagnoses were discussed with the patient. Patient was given return precautions.    Rx / DC Orders ED Discharge Orders     None        Venissa Nappi, MD 02/04/21 1552

## 2021-02-04 NOTE — Discharge Instructions (Addendum)
Colace one capsule twice daily and miralax one capful twice daily for one week then one capful daily thereafter

## 2021-02-04 NOTE — ED Notes (Signed)
Patient requesting [facility] for transport to private residence.

## 2021-02-04 NOTE — ED Triage Notes (Signed)
C/o constipation and rectal pain

## 2021-02-07 DIAGNOSIS — R609 Edema, unspecified: Secondary | ICD-10-CM | POA: Diagnosis not present

## 2021-02-07 DIAGNOSIS — Z743 Need for continuous supervision: Secondary | ICD-10-CM | POA: Diagnosis not present

## 2021-02-07 DIAGNOSIS — M7989 Other specified soft tissue disorders: Secondary | ICD-10-CM | POA: Diagnosis not present

## 2021-02-08 DIAGNOSIS — R9431 Abnormal electrocardiogram [ECG] [EKG]: Secondary | ICD-10-CM | POA: Diagnosis not present

## 2021-02-09 ENCOUNTER — Encounter (HOSPITAL_COMMUNITY): Payer: Self-pay | Admitting: Emergency Medicine

## 2021-02-09 ENCOUNTER — Other Ambulatory Visit: Payer: Self-pay

## 2021-02-09 ENCOUNTER — Emergency Department (HOSPITAL_COMMUNITY)
Admission: EM | Admit: 2021-02-09 | Discharge: 2021-02-09 | Disposition: A | Discharge status: Home / Self Care | Payer: Medicare HMO | Attending: Emergency Medicine | Admitting: Emergency Medicine

## 2021-02-09 DIAGNOSIS — R531 Weakness: Secondary | ICD-10-CM | POA: Diagnosis not present

## 2021-02-09 DIAGNOSIS — W19XXXA Unspecified fall, initial encounter: Secondary | ICD-10-CM | POA: Diagnosis not present

## 2021-02-09 DIAGNOSIS — Z743 Need for continuous supervision: Secondary | ICD-10-CM | POA: Diagnosis not present

## 2021-02-09 DIAGNOSIS — M6281 Muscle weakness (generalized): Secondary | ICD-10-CM | POA: Insufficient documentation

## 2021-02-09 DIAGNOSIS — Z5321 Procedure and treatment not carried out due to patient leaving prior to being seen by health care provider: Secondary | ICD-10-CM | POA: Diagnosis not present

## 2021-02-09 NOTE — ED Provider Notes (Signed)
Emergency Medicine Provider Triage Evaluation Note  Avanish Cerullo , a 60 y.o. male  was evaluated in triage.  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  Patient states that he would like to go back to a rehab facility however his insurance requires him to be sent after a hospitalization so he came to the emergency room.  States he had a fall earlier today but does not have any injuries as a result of the fall.  Review of Systems  Positive: Leg weakness Negative: Injury  Physical Exam  BP (!) 169/87 (BP Location: Right Arm)   Pulse 68   Temp 98.7 F (37.1 C) (Oral)   Resp 16   Wt 126.1 kg   SpO2 98%   BMI 38.77 kg/m  Gen:   Awake, no distress   Resp:  Normal effort  MSK:   Moves extremities without difficulty  Other:    Medical Decision Making  Medically screening exam initiated at 10:32 PM.  Appropriate orders placed.  Killian Schwer was informed that the remainder of the evaluation will be completed by another provider, this initial triage assessment does not replace that evaluation, and the importance of remaining in the ED until their evaluation is complete.     Tacy Learn, PA-C 02/09/21 2233    Lacretia Leigh, MD 02/09/21 [phone]

## 2021-02-09 NOTE — ED Triage Notes (Addendum)
Patient presents after fall by EMS. Patient states weakness has gotten worse over the last day and has fallen x2 today. Patient denies pain

## 2021-02-09 NOTE — ED Notes (Signed)
Patient told staff he did not want to wait and decided to leave. Patient left before MSE was signed.

## 2021-02-10 DIAGNOSIS — E1165 Type 2 diabetes mellitus with hyperglycemia: Secondary | ICD-10-CM | POA: Diagnosis not present

## 2021-02-11 ENCOUNTER — Other Ambulatory Visit: Payer: Self-pay | Admitting: Family Medicine

## 2021-02-13 ENCOUNTER — Emergency Department (HOSPITAL_BASED_OUTPATIENT_CLINIC_OR_DEPARTMENT_OTHER): Payer: Medicare HMO

## 2021-02-13 ENCOUNTER — Other Ambulatory Visit: Payer: Self-pay

## 2021-02-13 ENCOUNTER — Encounter (HOSPITAL_BASED_OUTPATIENT_CLINIC_OR_DEPARTMENT_OTHER): Payer: Self-pay | Admitting: *Deleted

## 2021-02-13 ENCOUNTER — Emergency Department (HOSPITAL_BASED_OUTPATIENT_CLINIC_OR_DEPARTMENT_OTHER)
Admission: EM | Admit: 2021-02-13 | Discharge: 2021-02-13 | Disposition: A | Discharge status: Home / Self Care | Payer: Medicare HMO | Attending: Emergency Medicine | Admitting: Emergency Medicine

## 2021-02-13 DIAGNOSIS — R531 Weakness: Secondary | ICD-10-CM | POA: Diagnosis not present

## 2021-02-13 DIAGNOSIS — E1142 Type 2 diabetes mellitus with diabetic polyneuropathy: Secondary | ICD-10-CM | POA: Insufficient documentation

## 2021-02-13 DIAGNOSIS — Z7984 Long term (current) use of oral hypoglycemic drugs: Secondary | ICD-10-CM | POA: Diagnosis not present

## 2021-02-13 DIAGNOSIS — F17210 Nicotine dependence, cigarettes, uncomplicated: Secondary | ICD-10-CM | POA: Diagnosis not present

## 2021-02-13 DIAGNOSIS — R55 Syncope and collapse: Secondary | ICD-10-CM | POA: Insufficient documentation

## 2021-02-13 DIAGNOSIS — R262 Difficulty in walking, not elsewhere classified: Secondary | ICD-10-CM | POA: Diagnosis not present

## 2021-02-13 DIAGNOSIS — I2510 Atherosclerotic heart disease of native coronary artery without angina pectoris: Secondary | ICD-10-CM | POA: Diagnosis not present

## 2021-02-13 DIAGNOSIS — M7989 Other specified soft tissue disorders: Secondary | ICD-10-CM | POA: Insufficient documentation

## 2021-02-13 DIAGNOSIS — W19XXXA Unspecified fall, initial encounter: Secondary | ICD-10-CM | POA: Diagnosis not present

## 2021-02-13 DIAGNOSIS — Z743 Need for continuous supervision: Secondary | ICD-10-CM | POA: Diagnosis not present

## 2021-02-13 DIAGNOSIS — Z794 Long term (current) use of insulin: Secondary | ICD-10-CM | POA: Diagnosis not present

## 2021-02-13 DIAGNOSIS — Z7401 Bed confinement status: Secondary | ICD-10-CM | POA: Diagnosis not present

## 2021-02-13 DIAGNOSIS — R69 Illness, unspecified: Secondary | ICD-10-CM | POA: Diagnosis not present

## 2021-02-13 DIAGNOSIS — Z79899 Other long term (current) drug therapy: Secondary | ICD-10-CM | POA: Insufficient documentation

## 2021-02-13 DIAGNOSIS — E860 Dehydration: Secondary | ICD-10-CM | POA: Diagnosis not present

## 2021-02-13 DIAGNOSIS — R42 Dizziness and giddiness: Secondary | ICD-10-CM | POA: Diagnosis not present

## 2021-02-13 DIAGNOSIS — I10 Essential (primary) hypertension: Secondary | ICD-10-CM | POA: Diagnosis not present

## 2021-02-13 LAB — URINALYSIS, ROUTINE W REFLEX MICROSCOPIC
Bilirubin Urine: NEGATIVE
Glucose, UA: 100 mg/dL — AB
Hgb urine dipstick: NEGATIVE
Ketones, ur: NEGATIVE mg/dL
Leukocytes,Ua: NEGATIVE
Nitrite: NEGATIVE
Protein, ur: NEGATIVE mg/dL
Specific Gravity, Urine: 1.015 (ref 1.005–1.030)
pH: 7.5 (ref 5.0–8.0)

## 2021-02-13 LAB — CBC WITH DIFFERENTIAL/PLATELET
Abs Immature Granulocytes: 0.01 10*3/uL (ref 0.00–0.07)
Basophils Absolute: 0 10*3/uL (ref 0.0–0.1)
Basophils Relative: 1 %
Eosinophils Absolute: 0.2 10*3/uL (ref 0.0–0.5)
Eosinophils Relative: 3 %
HCT: 39.4 % (ref 39.0–52.0)
Hemoglobin: 13.4 g/dL (ref 13.0–17.0)
Immature Granulocytes: 0 %
Lymphocytes Relative: 22 %
Lymphs Abs: 1.3 10*3/uL (ref 0.7–4.0)
MCH: 30.2 pg (ref 26.0–34.0)
MCHC: 34 g/dL (ref 30.0–36.0)
MCV: 88.7 fL (ref 80.0–100.0)
Monocytes Absolute: 0.6 10*3/uL (ref 0.1–1.0)
Monocytes Relative: 10 %
Neutro Abs: 3.7 10*3/uL (ref 1.7–7.7)
Neutrophils Relative %: 64 %
Platelets: 248 10*3/uL (ref 150–400)
RBC: 4.44 MIL/uL (ref 4.22–5.81)
RDW: 12.6 % (ref 11.5–15.5)
WBC: 5.8 10*3/uL (ref 4.0–10.5)
nRBC: 0 % (ref 0.0–0.2)

## 2021-02-13 LAB — COMPREHENSIVE METABOLIC PANEL
ALT: 21 U/L (ref 0–44)
AST: 17 U/L (ref 15–41)
Albumin: 3.9 g/dL (ref 3.5–5.0)
Alkaline Phosphatase: 58 U/L (ref 38–126)
Anion gap: 9 (ref 5–15)
BUN: 23 mg/dL — ABNORMAL HIGH (ref 6–20)
CO2: 27 mmol/L (ref 22–32)
Calcium: 10.1 mg/dL (ref 8.9–10.3)
Chloride: 100 mmol/L (ref 98–111)
Creatinine, Ser: 0.89 mg/dL (ref 0.61–1.24)
GFR, Estimated: >60 mL/min (ref >60)
Glucose, Bld: 136 mg/dL — ABNORMAL HIGH (ref 70–99)
Potassium: 3.7 mmol/L (ref 3.5–5.1)
Sodium: 136 mmol/L (ref 135–145)
Total Bilirubin: 0.4 mg/dL (ref 0.3–1.2)
Total Protein: 7 g/dL (ref 6.5–8.1)

## 2021-02-13 LAB — CBG MONITORING, ED: Glucose-Capillary: 118 mg/dL — ABNORMAL HIGH (ref 70–99)

## 2021-02-13 IMAGING — DX DG KNEE COMPLETE 4+V*R*
4 series · 4 of 4 positions shown · non-contrast
Comparison: [DATE]

CLINICAL DATA: generalized weakness, fall

EXAM:
RIGHT KNEE - COMPLETE 4+ VIEW

[knee ap]
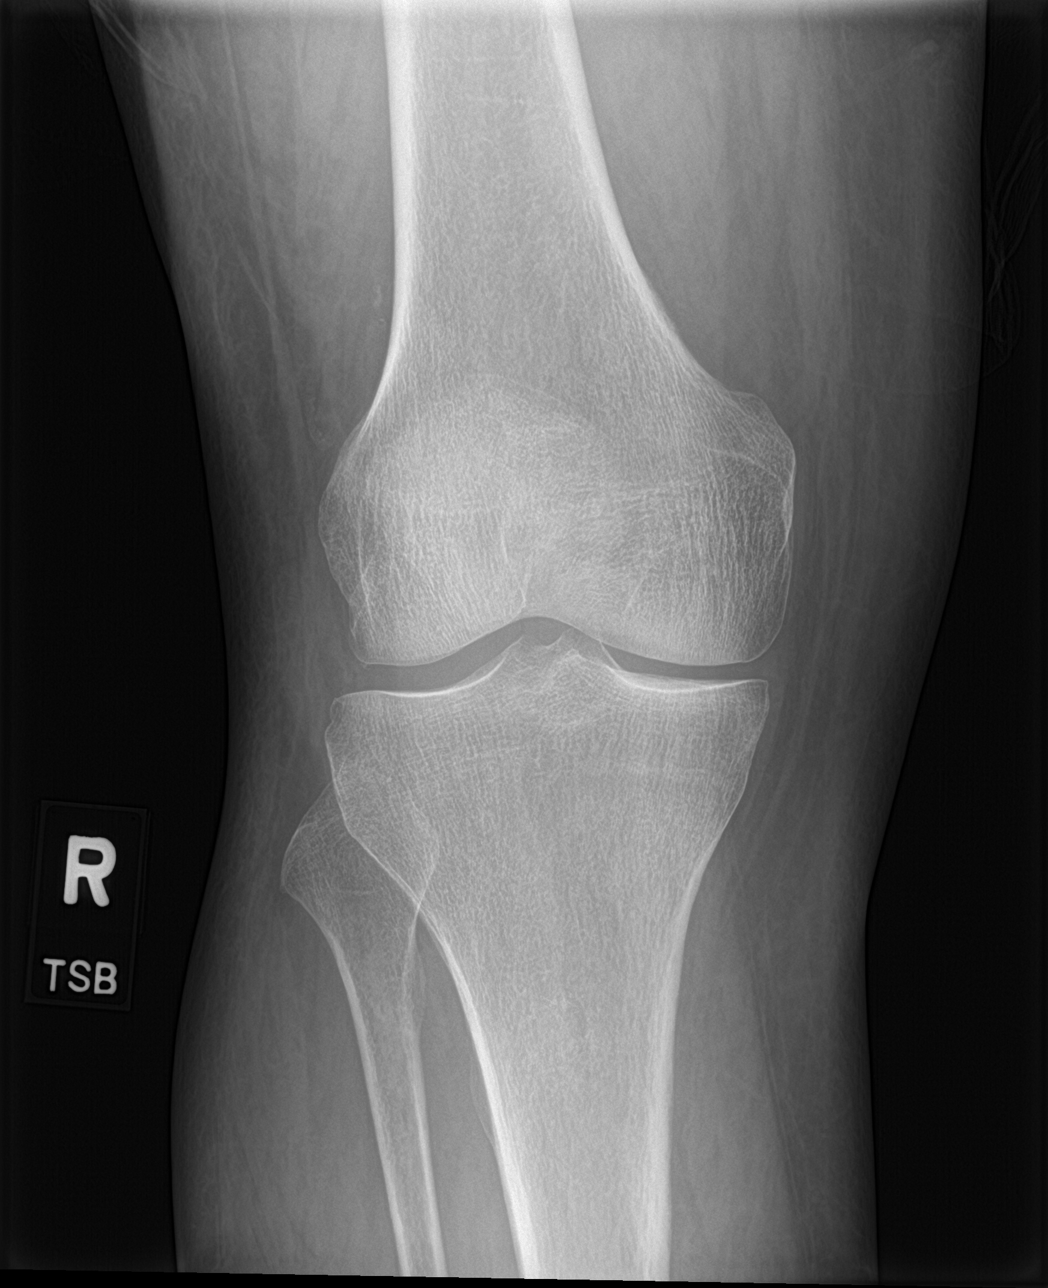

[knee lat]
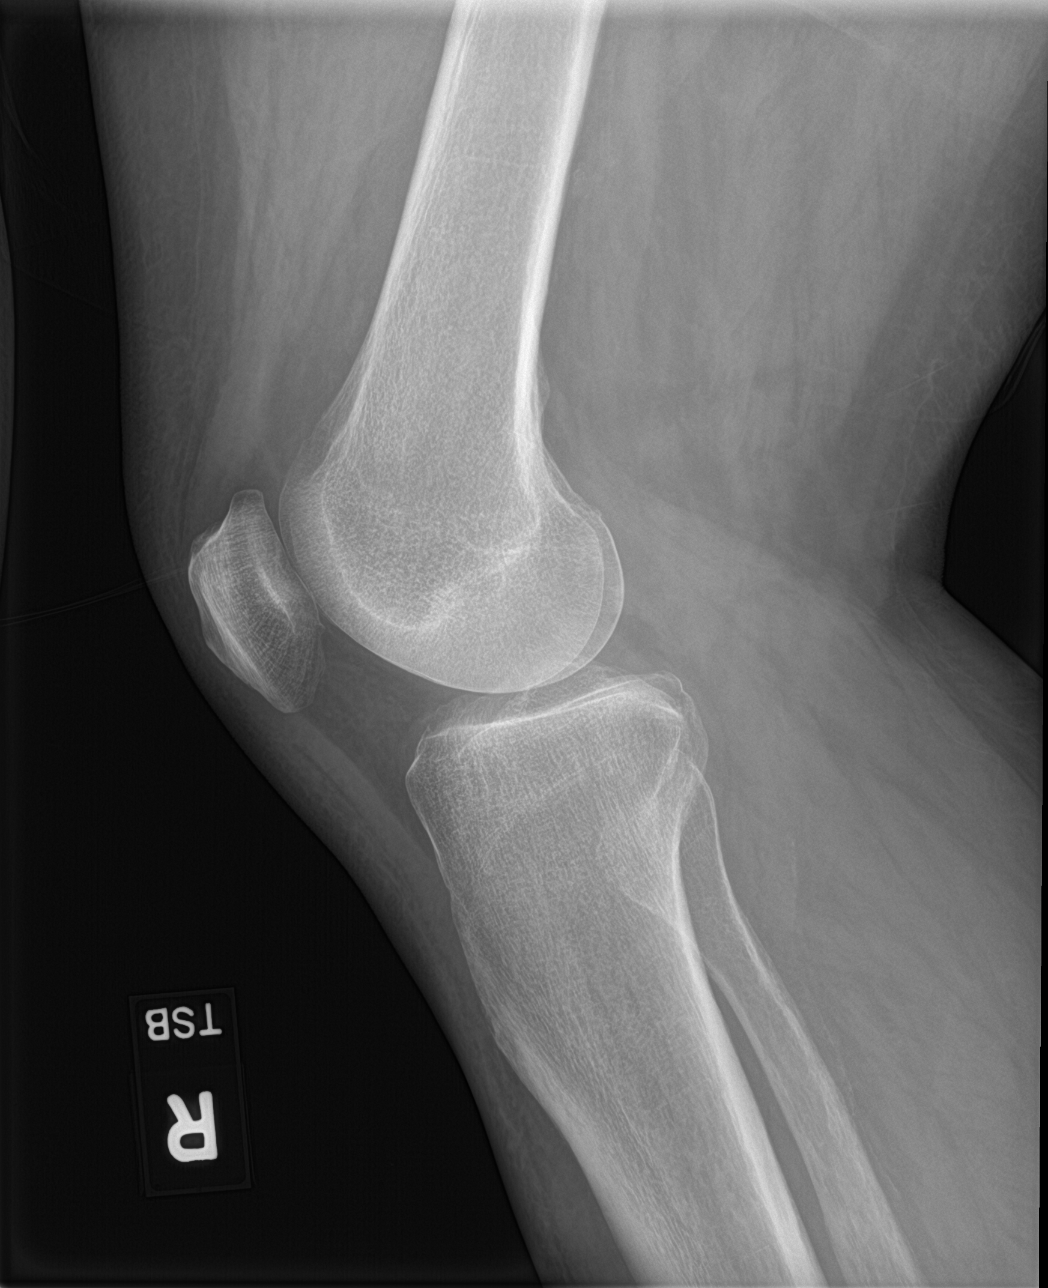

[knee obl (1 of 2)]
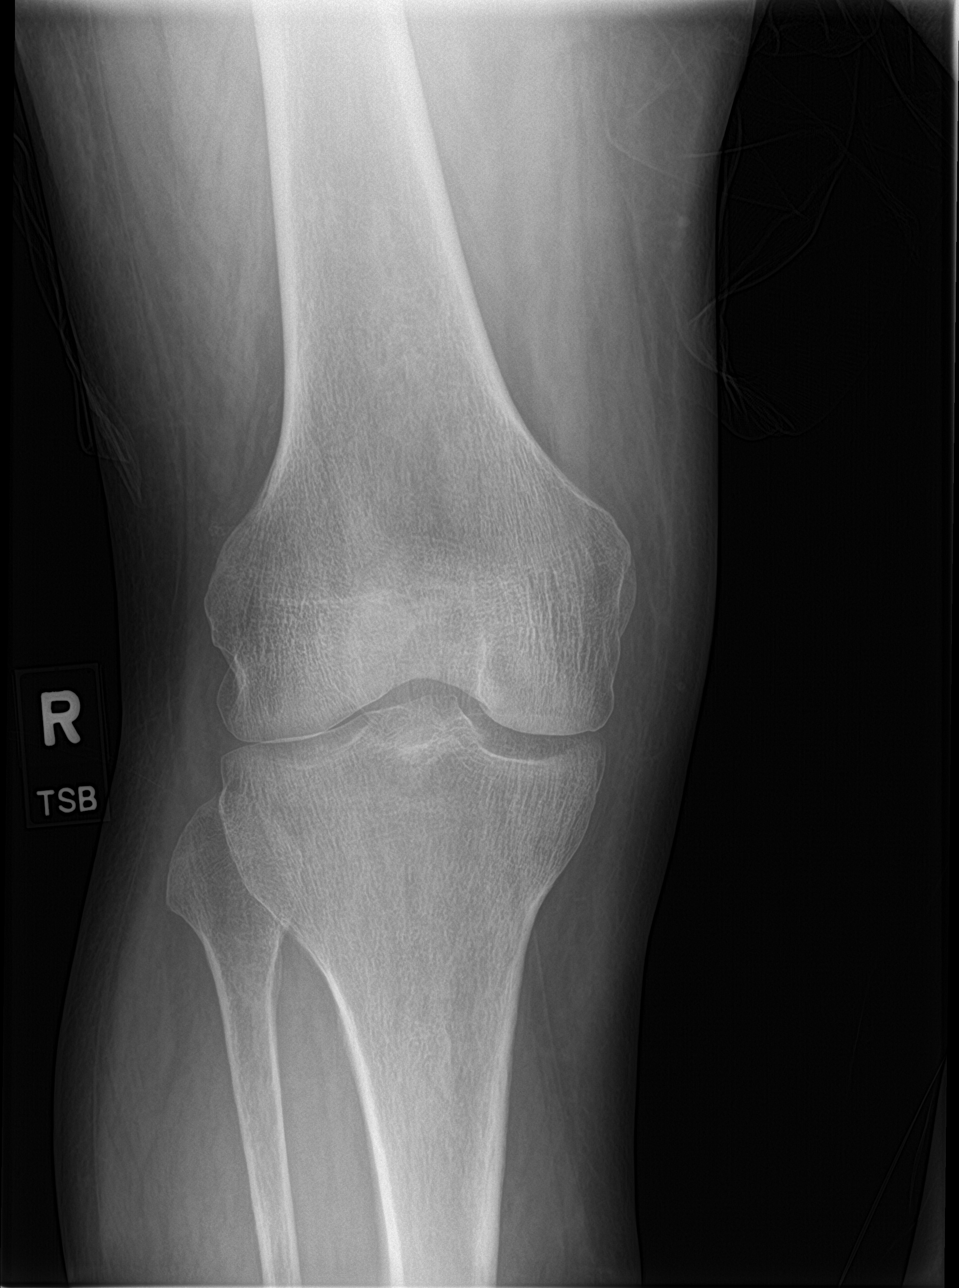

[knee obl (2 of 2)]
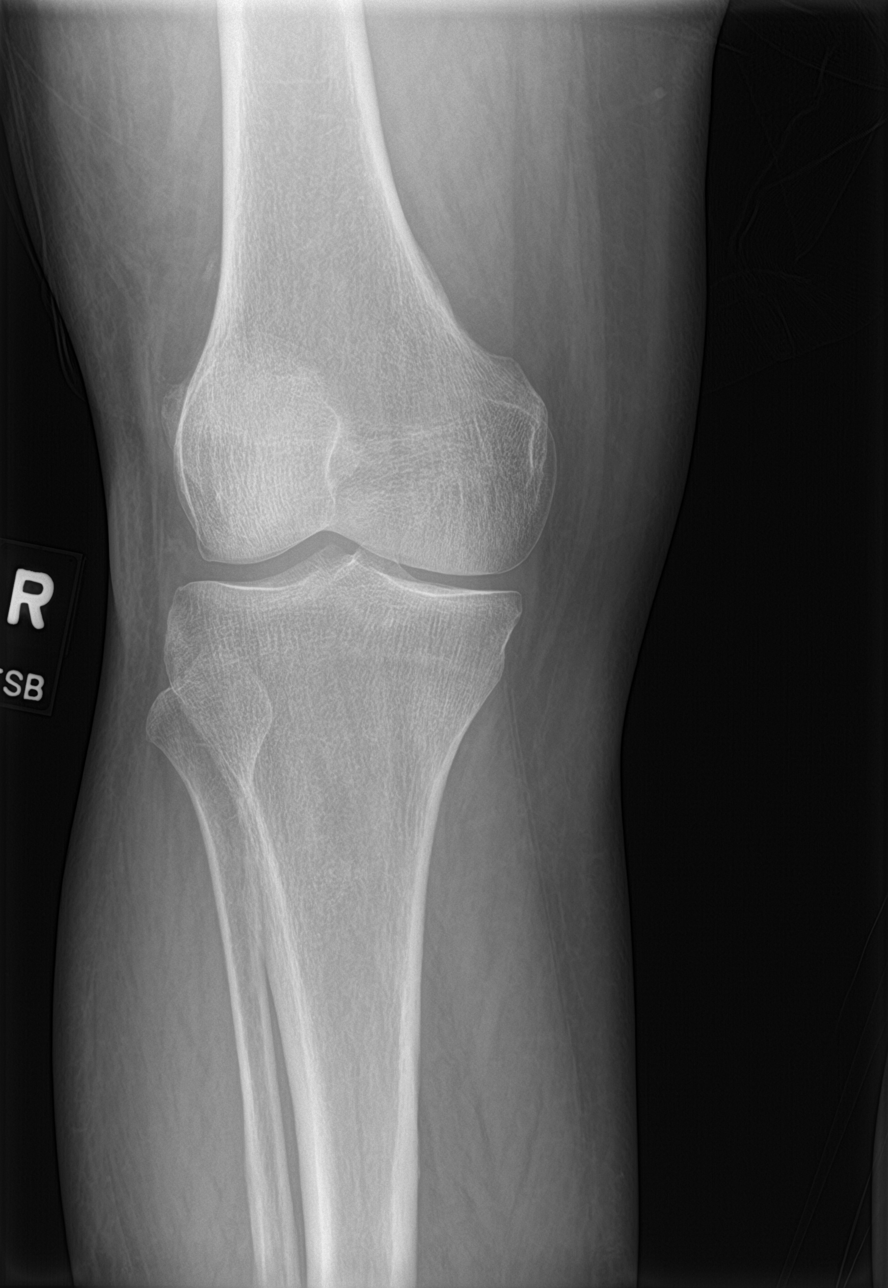

[4 of 4 positions shown; findings below may reference images not displayed]

FINDINGS: No evidence of fracture, dislocation, or joint effusion. No evidence
of arthropathy or other focal bone abnormality. Soft tissues are
unremarkable.
IMPRESSION: Negative.

## 2021-02-13 IMAGING — DX DG CHEST 2V
2 series · 2 of 2 positions shown · non-contrast
Comparison: [DATE]

CLINICAL DATA: Patient passed out this morning after using the
bathroom. Room ANSU found him on the floor Pt stated that he was on
the floor for at least 10 minutes.

EXAM:
CHEST - 2 VIEW

[chest pa]
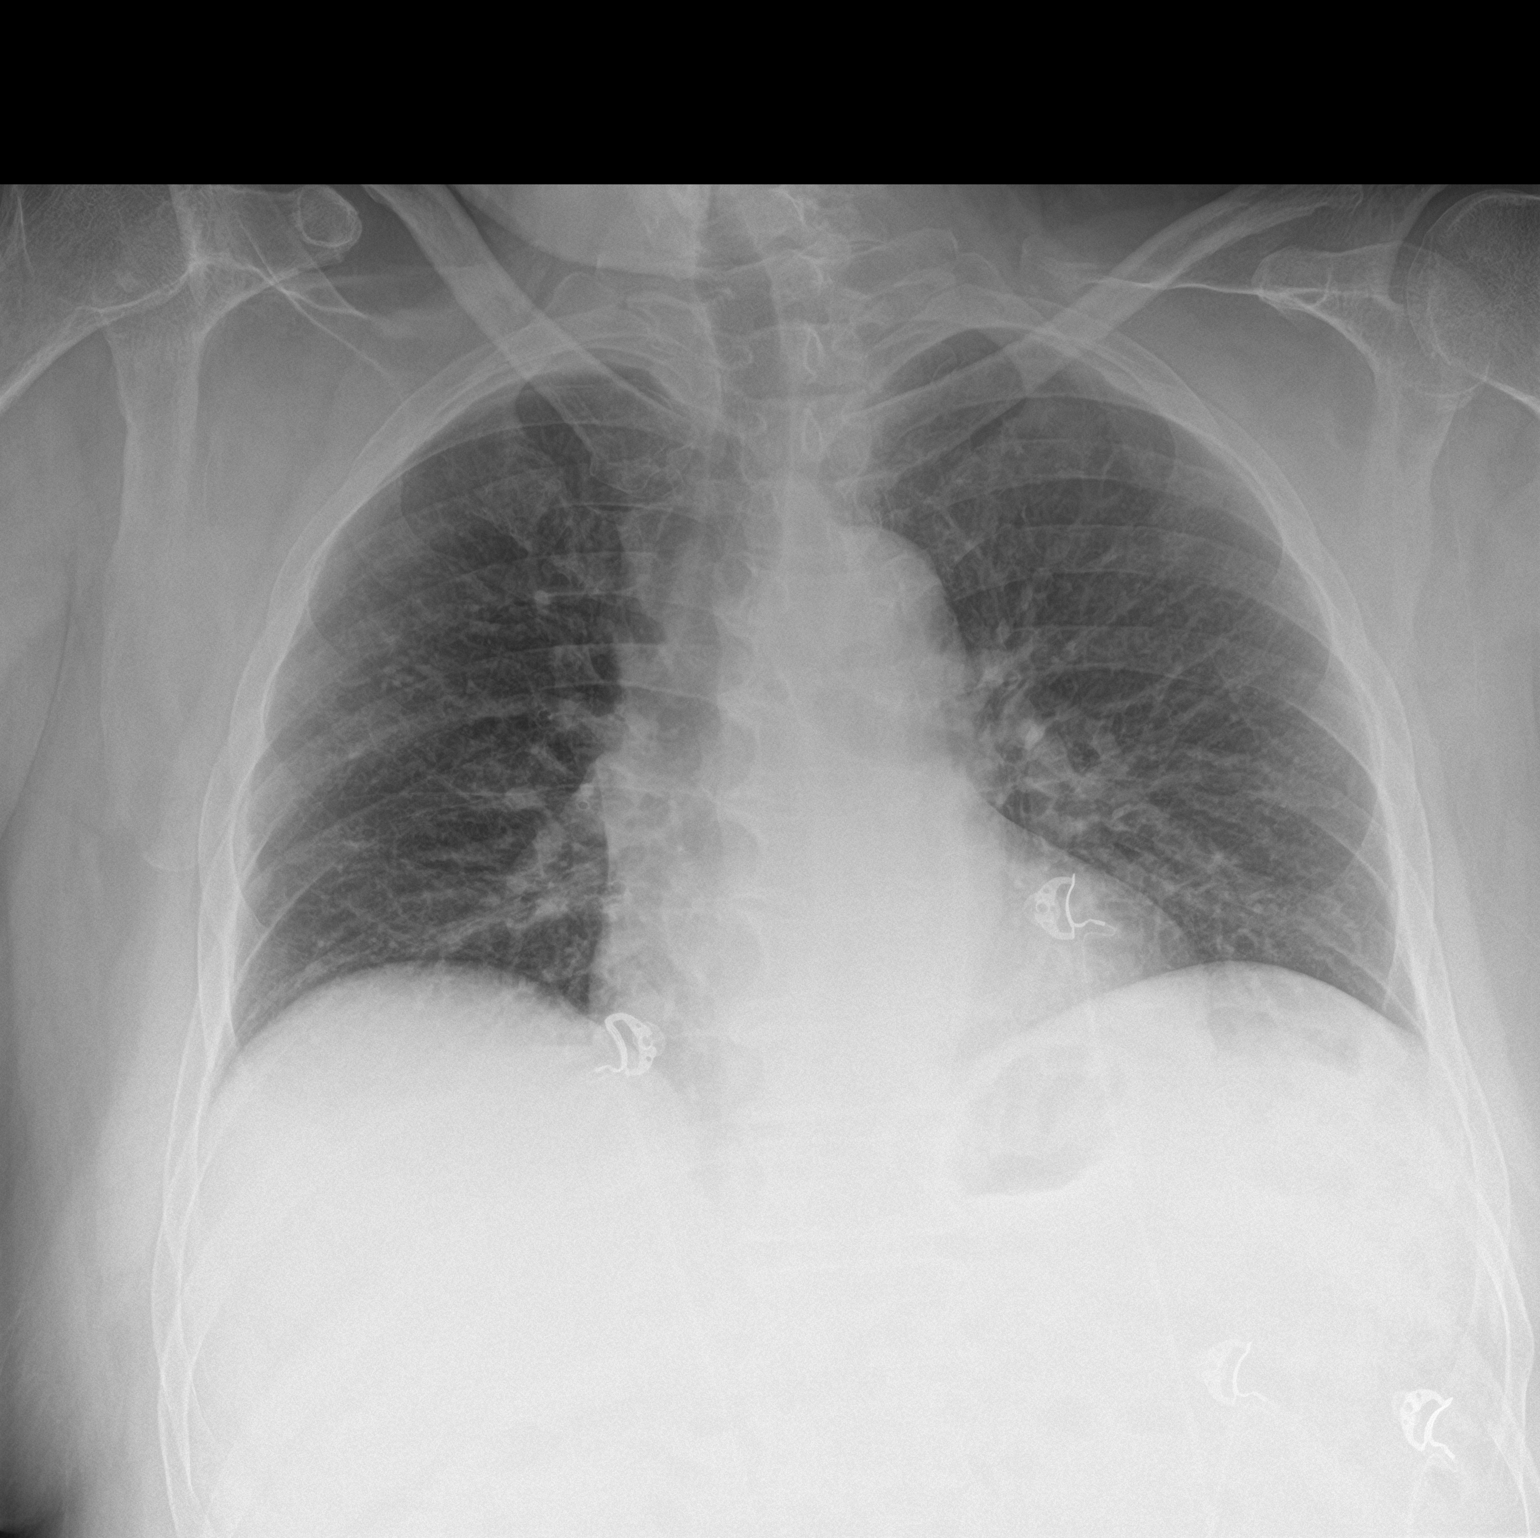

[chest lat]
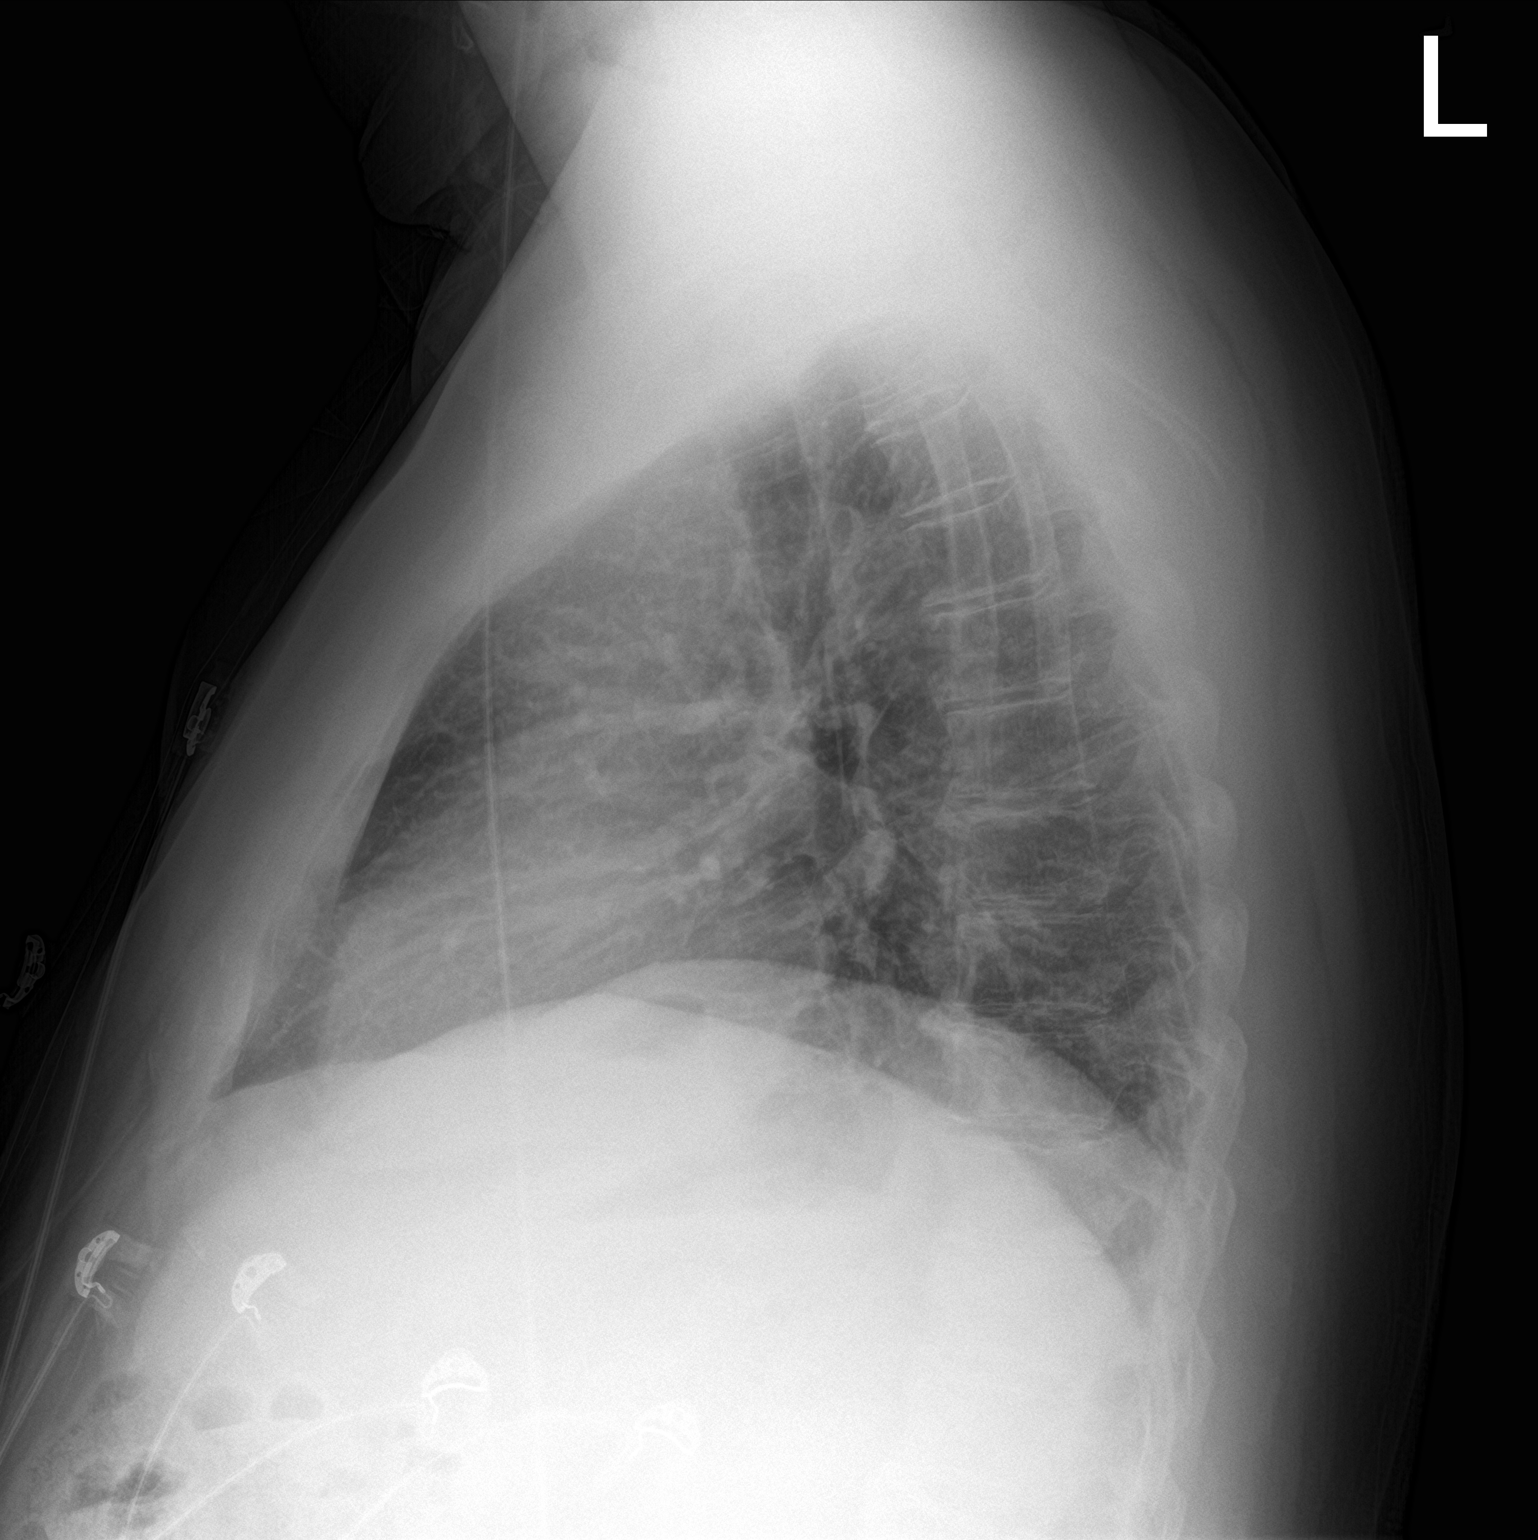

[2 of 2 positions shown; findings below may reference images not displayed]

FINDINGS: Relatively low lung volumes with crowding of bronchovascular
structures in the lung bases.

Heart size and mediastinal contours are within normal limits.

No effusion. No pneumothorax. Anterior vertebral endplate spurring
at multiple levels in the mid and lower thoracic spine.
IMPRESSION: No acute cardiopulmonary disease.

## 2021-02-13 MED ORDER — SODIUM CHLORIDE 0.9 % IV BOLUS
1000.0000 mL | Freq: Once | INTRAVENOUS | Status: AC
Start: 2021-02-13 — End: 2021-02-13
  Administered 2021-02-13: 1000 mL via INTRAVENOUS

## 2021-02-13 NOTE — ED Provider Notes (Signed)
[facility] EMERGENCY DEPARTMENT Provider Note   CSN: [id number] Arrival date & time: 02/13/21  4401     History No chief complaint on file.   Tyler Brewer is a 60 y.o. male.  HPI 60 year old male presents with weakness and syncope.  The patient states he has been having progressive weakness and difficulty walking for months.  Has been overall worse for the last 1 month.  However over the last week or so he has been generally weak and "woozy".  He is lightheaded.  This is especially whenever he tries to stand or walk.  Went to the bathroom this morning and when he was coming back he was feeling lightheaded to the point that he passed out.  Roommate found him on the floor.  He denies any injuries from the fall.  No headache, chest pain, dyspnea, abdominal pain or vomiting/diarrhea.  He has some chronic pains and his right knee has been hurting since it buckled on him last week.  Otherwise he reports that he has been having some bilateral foot swelling and recently had his Lasix dose increased about a week ago.  He feels thirsty.  Past Medical History:  Diagnosis Date   Arthritis    CAD (coronary artery disease)  cardiac cath with moderate disease in a septal branch of the ramus intermedius 04/01/2012   Depression    Diabetes mellitus    poorly controlled by his report   History of narcotic addiction ([place])    past history of back pain   Hypercholesteremia    Hypertension    IBS (irritable bowel syndrome)    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[place])    Neuropathy    Obesity    Max weight was 390   OSA on CPAP    Panic attacks    Testosterone deficiency    Vertigo     Patient Active Problem List   Diagnosis Date Noted   CIDP (chronic inflammatory demyelinating polyneuropathy) ([place]) 01/12/2021   Diabetes mellitus ([place]) 01/03/2021   Type 2 diabetes mellitus with hyperglycemia, with long-term current use of insulin ([place]) 01/03/2021   Lower extremity edema 11/28/2020   Chronic  low back pain 11/28/2020   Psoriatic arthritis ([place]) 11/28/2020   Thoracic aortic aneurysm without rupture ([place]) 11/28/2020   Bilateral leg weakness 11/05/2020   Acute left-sided low back pain with right-sided sciatica 07/19/2020   Hip pain, acute, left 07/05/2020   Tremor 07/05/2020   Weakness 07/05/2020   Balance problem 03/11/2020   Tinea cruris 03/11/2020   Cellulitis of left groin 03/11/2020   Acute pain of right shoulder 03/11/2020   Interstitial lung disease ([place]) 05/02/2017   Pansinusitis 09/26/2016   Diabetic polyneuropathy associated with diabetes mellitus due to underlying condition ([place]) 05/08/2016   Methamphetamine use disorder, severe, dependence ([place]) 02/11/2016   Substance induced mood disorder ([place]) 02/11/2016   Exertional dyspnea 11/30/2015   ADD (attention deficit disorder) 09/29/2015   Binge eating 09/29/2015   Syncope 05/05/2012   Diarrhea 05/05/2012   Panic attacks 05/05/2012   OSA (obstructive sleep apnea) 05/05/2012   HTN (hypertension) 05/05/2012   Dyslipidemia 05/05/2012   CAD (coronary artery disease)  cardiac cath with moderate disease in a septal branch of the ramus intermedius 04/01/2012   Chest pain 04/01/2012   Drug abuse and dependence ([place]) 04/01/2012   Family history of coronary artery disease 03/31/2012   Sleep apnea, on C-pap 03/31/2012   Hyperlipemia 03/30/2012   HTN (hypertension), benign 03/30/2012   Morbid obesity ([place]) 03/30/2012  DM type 2, uncontrolled, with neuropathy ([place]) 03/30/2012   Depression with suicidal ideation 03/30/2012    Past Surgical History:  Procedure Laterality Date   CARDIAC CATHETERIZATION     IR FLUORO GUIDE CV LINE RIGHT  11/10/2020   IR US GUIDE VASC ACCESS RIGHT  11/10/2020   LEFT HEART CATHETERIZATION WITH CORONARY ANGIOGRAM N/A 03/31/2012   Procedure: LEFT HEART CATHETERIZATION WITH CORONARY ANGIOGRAM;  Surgeon: Leonie Man, MD;  Location: [facility] CATH LAB;  Service: Cardiovascular;  Laterality: N/A;        Family History  Problem Relation Age of Onset   Diabetes type II Father    Hypertension Father    Pancreatic disease Father 5       Deceased   Healthy Mother    Healthy Sister    Healthy Son    Healthy Daughter    Parkinson's disease Maternal Grandmother     Social History   Tobacco Use   Smoking status: Every Day    Packs/day: 0.50    Pack years: 0.00    Types: Cigarettes    Last attempt to quit: 09/29/2020    Years since quitting: 0.3   Smokeless tobacco: Never  Vaping Use   Vaping Use: Never used  Substance Use Topics   Alcohol use: No   Drug use: Not Currently    Types: Methamphetamines    Home Medications Prior to Admission medications   Medication Sig Start Date End Date Taking? Authorizing Provider  acetaminophen (TYLENOL) 500 MG tablet Take 500 mg by mouth every 6 (six) hours as needed. States he is taking one to two tablets a day   Yes [provider]  DULoxetine (CYMBALTA) 30 MG capsule Take 1 capsule (30 mg total) by mouth daily. Patient taking differently: Take 30 mg by mouth 3 (three) times a week. 07/19/20  Yes [name] R, DO  furosemide (LASIX) 20 MG tablet TAKE 1 TABLET(20 MG) BY MOUTH DAILY 12/21/20  Yes [name] R, DO  gabapentin (NEURONTIN) 300 MG capsule Take 2 tablets tid 01/23/21  Yes [name] R, DO  hydrALAZINE (APRESOLINE) 50 MG tablet Take 1 tablet (50 mg total) by mouth every 8 (eight) hours. 01/26/21  Yes [name] R, DO  insulin aspart (NOVOLOG FLEXPEN) 100 UNIT/ML FlexPen Inject 12 Units into the skin 3 (three) times daily with meals. 01/03/21  Yes [name], Melanie Crazier, MD  insulin degludec (TRESIBA FLEXTOUCH) 200 UNIT/ML FlexTouch Pen Inject 46 Units into the skin daily. 01/03/21  Yes [name], Melanie Crazier, MD  losartan (COZAAR) 100 MG tablet Take 1 tablet (100 mg total) by mouth daily. 01/26/21  Yes [name] R, DO  meloxicam (MOBIC) 7.5 MG tablet Take 1 tablet (7.5 mg  total) by mouth daily. 01/25/21  Yes [name] R, DO  Menthol, Topical Analgesic, (ICY HOT EX) Apply topically as needed.   Yes [provider]  metFORMIN (GLUCOPHAGE-XR) 500 MG 24 hr tablet Take 2 tablets (1,000 mg total) by mouth in the morning and at bedtime. 01/26/21  Yes [name] R, DO  oxyCODONE (ROXICODONE) 5 MG immediate release tablet Take 1 tablet (5 mg total) by mouth every 6 (six) hours as needed for up to 20 doses for severe pain. 02/03/21  Yes [name] R, DO  pravastatin (PRAVACHOL) 40 MG tablet Take 1 tablet (40 mg total) by mouth daily. 01/26/21  Yes [name] R, DO  tamsulosin (FLOMAX) 0.4 MG CAPS capsule  Take 1 capsule (0.4 mg total) by mouth daily. 01/26/21  Yes [name], DO  traMADol (ULTRAM) 50 MG tablet 2 po q6 h prn 02/03/21  Yes Lowne Lyndal Pulley R, DO  amLODipine (NORVASC) 10 MG tablet Take 1 tablet (10 mg total) by mouth daily. 01/26/21   [name], DO  ibuprofen (ADVIL) 200 MG tablet Take 200 mg by mouth every 6 (six) hours as needed.    [provider]  Insulin Pen Needle 31G X 5 MM MISC 1 Device by Does not apply route in the morning, at noon, in the evening, and at bedtime. 01/03/21   [name], Melanie Crazier, MD  methocarbamol (ROBAXIN) 500 MG tablet TAKE ONE TABLET BY MOUTH FOUR TIMES DAILY 02/13/21   [name], DO    Allergies    Patient has no known allergies.  Review of Systems   Review of Systems  Constitutional:  Negative for fever.  Respiratory:  Negative for cough and shortness of breath.   Cardiovascular:  Positive for leg swelling. Negative for chest pain.  Gastrointestinal:  Positive for constipation. Negative for abdominal pain, diarrhea and vomiting.  Genitourinary:  Negative for dysuria.  Neurological:  Positive for syncope, weakness, light-headedness and numbness (chronic).  All other systems reviewed and are negative.  Physical Exam Updated Vital Signs BP  (!) 184/94   Pulse 99   Temp 98.4 F (36.9 C) (Oral)   Resp 17   Ht 5\' 11"  (1.803 m)   Wt 126.1 kg   SpO2 95%   BMI 38.77 kg/m   Physical Exam Vitals and nursing note reviewed.  Constitutional:      General: He is not in acute distress.    Appearance: He is well-developed. He is not ill-appearing or diaphoretic.  HENT:     Head: Normocephalic and atraumatic.     Right Ear: External ear normal.     Left Ear: External ear normal.     Nose: Nose normal.     Mouth/Throat:     Mouth: Mucous membranes are dry.  Eyes:     General:        Right eye: No discharge.        Left eye: No discharge.     Extraocular Movements: Extraocular movements intact.     Pupils: Pupils are equal, round, and reactive to light.  Cardiovascular:     Rate and Rhythm: Normal rate and regular rhythm.     Heart sounds: Normal heart sounds. No murmur heard. Pulmonary:     Effort: Pulmonary effort is normal.     Breath sounds: Normal breath sounds.  Abdominal:     Palpations: Abdomen is soft.     Tenderness: There is no abdominal tenderness.  Musculoskeletal:     Cervical back: Neck supple.     Right upper leg: No tenderness.     Right knee: No swelling or deformity. Normal range of motion. Tenderness present.     Right lower leg: No tenderness.     Comments: Bilateral feet swelling.  Skin:    General: Skin is warm and dry.  Neurological:     Mental Status: He is alert.     Comments: CN 3-12 grossly intact. 5/5 strength in both upper extremities.  4/5 in both lower extremities which she states is baseline.  Grossly normal sensation. Normal finger to nose.   Psychiatric:        Mood and Affect: Mood is not anxious.  ED Results / Procedures / Treatments   Labs (all labs ordered are listed, but only abnormal results are displayed) Labs Reviewed  COMPREHENSIVE METABOLIC PANEL - Abnormal; Notable for the following components:      Result Value   Glucose, Bld 136 (*)    BUN 23 (*)    All other  components within normal limits  URINALYSIS, ROUTINE W REFLEX MICROSCOPIC - Abnormal; Notable for the following components:   Glucose, UA 100 (*)    All other components within normal limits  CBG MONITORING, ED - Abnormal; Notable for the following components:   Glucose-Capillary 118 (*)    All other components within normal limits  CBC WITH DIFFERENTIAL/PLATELET    EKG EKG Interpretation  Date/Time:  Monday February 13 2021 08:20:18 EDT Ventricular Rate:  86 PR Interval:  185 QRS Duration: 96 QT Interval:  355 QTC Calculation: 425 R Axis:   1 Text Interpretation: Sinus rhythm Low voltage, precordial leads no significant change since May 2022 Confirmed by Sherwood Gambler [id number]) on 02/13/2021 8:55:20 AM  Radiology DG Chest 2 View  Result Date: 02/13/2021 CLINICAL DATA:  Patient passed out this morning after using the bathroom. Room mate found him on the floor Pt stated that he was on the floor for at least 10 minutes. EXAM: CHEST - 2 VIEW COMPARISON:  02/07/2021 FINDINGS: Relatively low lung volumes with crowding of bronchovascular structures in the lung bases. Heart size and mediastinal contours are within normal limits. No effusion. No pneumothorax. Anterior vertebral endplate spurring at multiple levels in the mid and lower thoracic spine. IMPRESSION: No acute cardiopulmonary disease. Electronically Signed   By: Lucrezia Europe M.D.   On: 02/13/2021 09:37   DG Knee Complete 4 Views Right  Result Date: 02/13/2021 CLINICAL DATA:  generalized weakness, fall EXAM: RIGHT KNEE - COMPLETE 4+ VIEW COMPARISON:  11/01/2020 FINDINGS: No evidence of fracture, dislocation, or joint effusion. No evidence of arthropathy or other focal bone abnormality. Soft tissues are unremarkable. IMPRESSION: Negative. Electronically Signed   By: Lucrezia Europe M.D.   On: 02/13/2021 09:55    Procedures Procedures   Medications Ordered in ED Medications  sodium chloride 0.9 % bolus 1,000 mL (0 mLs Intravenous Stopped  02/13/21 1113)    ED Course  I have reviewed the triage vital signs and the nursing notes.  Pertinent labs & imaging results that were available during my care of the patient were reviewed by me and considered in my medical decision making (see chart for details).    MDM Rules/Calculators/A&P                          Patient with lightheadedness for several days.  He is also been having progressively generalized weakness for quite some time.  Work-up is fairly unremarkable.  I do think dehydration could be playing a little bit of a role given he increased his Lasix from 20 mg to 80 mg a day.  We discussed that is probably too strong for him despite him still having a little bit of foot swelling.  He was given some IV fluids.  No obvious infection.  At this point he appears stable for discharge home to follow-up with his PCP.  I have low suspicion for a neuro emergency or cardiac cause of his syncope. Final diagnoses:  Syncope and collapse  Generalized weakness    Rx / DC Orders ED Discharge Orders     None  Sherwood Gambler, MD 02/13/21 [phone]

## 2021-02-13 NOTE — ED Notes (Signed)
CBG READING WAS 118

## 2021-02-13 NOTE — ED Triage Notes (Signed)
Patient passed out this morning after using the bathroom.  Room mate found him on the floor Pt stated that he was on the floor for at least 10 minutes.

## 2021-02-14 ENCOUNTER — Telehealth: Payer: Self-pay

## 2021-02-14 ENCOUNTER — Ambulatory Visit (INDEPENDENT_AMBULATORY_CARE_PROVIDER_SITE_OTHER): Payer: Medicare HMO | Admitting: Family Medicine

## 2021-02-14 VITALS — BP 120/80 | HR 90 | Temp 98.9°F | Resp 20 | Ht 71.0 in | Wt 279.0 lb

## 2021-02-14 DIAGNOSIS — G8929 Other chronic pain: Secondary | ICD-10-CM | POA: Diagnosis not present

## 2021-02-14 DIAGNOSIS — E785 Hyperlipidemia, unspecified: Secondary | ICD-10-CM | POA: Diagnosis not present

## 2021-02-14 DIAGNOSIS — E1165 Type 2 diabetes mellitus with hyperglycemia: Secondary | ICD-10-CM | POA: Diagnosis not present

## 2021-02-14 DIAGNOSIS — G6181 Chronic inflammatory demyelinating polyneuritis: Secondary | ICD-10-CM | POA: Diagnosis not present

## 2021-02-14 DIAGNOSIS — M545 Low back pain, unspecified: Secondary | ICD-10-CM

## 2021-02-14 DIAGNOSIS — G4733 Obstructive sleep apnea (adult) (pediatric): Secondary | ICD-10-CM

## 2021-02-14 DIAGNOSIS — I10 Essential (primary) hypertension: Secondary | ICD-10-CM | POA: Diagnosis not present

## 2021-02-14 DIAGNOSIS — Z794 Long term (current) use of insulin: Secondary | ICD-10-CM | POA: Diagnosis not present

## 2021-02-14 NOTE — Assessment & Plan Note (Signed)
Per endo °

## 2021-02-14 NOTE — Assessment & Plan Note (Signed)
Well controlled, no changes to meds. Encouraged heart healthy diet such as the DASH diet and exercise as tolerated.  °

## 2021-02-14 NOTE — Telephone Encounter (Signed)
[facility] [place] called in regards to orders faxed over on 06/17 and 06/27 , needing orders signed by Dr. Etter Sjogren for adding on PT   Call back:  [phone]

## 2021-02-14 NOTE — Progress Notes (Addendum)
Subjective:   By signing my name below, I, Tyler Brewer, attest that this documentation has been prepared under the direction and in the presence of Dr. [name], DO. 02/14/2021     Patient ID: Tyler Brewer, male    DOB: 1961-05-13, 60 y.o.   MRN: [id number]  Chief Complaint  Patient presents with   Power Mobility Device evaluation    Please see attached documents detailing patient mobility.     HPI Patient is in today for a face to face for a power mobility device evaluation.  Patient has morbid obesity, CIPD, chronic pain, arthritis, coronary artery disease, diabetes mellitus, hypertension, hyperlipidemia, neuropathy, IBS, sleep apnea, and hypercholesteremia. He is unable to perform a weight shift, and is high risk for developing pressure ulcers. He has decreased strength, poor endurance, imbalance, and morbid obesity.  Patient has limited ROM in lower extremities and poor endurance, This reduces is daily living. He has fallen over 30 times in the last 6 months. He has 10/10 pain in his back. He cannot safely use fitted cane or walker due to severe balance issues and severs back pain. Patient is not able to self propel due to severe pain and weakness in the upper extremities. Patients cognitive value is still good. He is able to use a power chair safely.  He fainted yesterday due to increasing his dosage of lasix causing him dehydration. He denies having any bed sores at this time. He has an upcoming appointment with his neurologist to help manage his neuropathy.  He does not see he pulmonologist to manage his sleep apnea but is planning to make an appointment.  His has not taken his blood pressure medication today. His blood pressure is well controlled at this time. 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[place])    Neuropathy    Obesity    Max weight was 390   OSA on CPAP    Panic attacks    Testosterone deficiency    Vertigo     Past Surgical History:  Procedure Laterality Date   CARDIAC CATHETERIZATION     IR FLUORO GUIDE CV LINE RIGHT  11/10/2020   IR US GUIDE VASC ACCESS RIGHT  11/10/2020   LEFT HEART CATHETERIZATION WITH CORONARY ANGIOGRAM N/A 03/31/2012   Procedure: LEFT HEART CATHETERIZATION WITH CORONARY ANGIOGRAM;  Surgeon: Leonie Man, MD;  Location: [facility] C[facility] LAB;  Service: Cardiovascular;  Laterality: N/A;    Family History  Problem Relation Age of Onset   Diabetes type II Father    Hypertension Father    Pancreatic disease Father 69       Deceased   Healthy Mother    Healthy Sister    Healthy Son    Healthy Daughter    Parkinson's disease Maternal Grandmother     Social History   Socioeconomic History   Marital status: Single    Spouse name: Not on file   Number of children: 2  Years of education: Not on file   Highest education level: Not on file  Occupational History   Not on file  Tobacco Use   Smoking status: Every Day    Packs/day: 0.50    Pack years: 0.00    Types: Cigarettes    Last attempt to quit: 09/29/2020    Years since quitting: 0.3   Smokeless tobacco: Never  Vaping Use   Vaping Use: Never used  Substance and Sexual Activity   Alcohol use: No   Drug use: Not Currently    Types: Methamphetamines   Sexual activity: Yes  Other Topics Concern   Not on file  Social History Narrative     Has 2 grown children.     Works as a Barrister's clerk.  Education: Oceanographer.   Right Handed   Drinks Caffeine    Mother recently moved and sold her  house-- pt is living with a friend in a trailer    Social Determinants of Health   Financial Resource Strain: Medium Risk   Difficulty of Paying Living Expenses: Somewhat hard  Food Insecurity: No Food Insecurity   Worried About Charity fundraiser in the Last Year: Never true   Arboriculturist in the Last Year: Never true  Transportation Needs: Public librarian (Medical): Yes   Lack of Transportation (Non-Medical): Yes  Physical Activity: Not on file  Stress: Not on file  Social Connections: Not on file  Intimate Partner Violence: Not on file    Outpatient Medications Prior to Visit  Medication Sig Dispense Refill   acetaminophen (TYLENOL) 500 MG tablet Take 500 mg by mouth every 6 (six) hours as needed. States he is taking one to two tablets a day     amLODipine (NORVASC) 10 MG tablet Take 1 tablet (10 mg total) by mouth daily. 90 tablet 1   DULoxetine (CYMBALTA) 30 MG capsule Take 1 capsule (30 mg total) by mouth daily. (Patient taking differently: Take 30 mg by mouth 3 (three) times a week.) 60 capsule 3   furosemide (LASIX) 20 MG tablet TAKE 1 TABLET(20 MG) BY MOUTH DAILY 90 tablet 0   hydrALAZINE (APRESOLINE) 50 MG tablet Take 1 tablet (50 mg total) by mouth every 8 (eight) hours. 90 tablet 0   ibuprofen (ADVIL) 200 MG tablet Take 200 mg by mouth every 6 (six) hours as needed.     insulin aspart (NOVOLOG FLEXPEN) 100 UNIT/ML FlexPen Inject 12 Units into the skin 3 (three) times daily with meals. 30 mL 2   insulin degludec (TRESIBA FLEXTOUCH) 200 UNIT/ML FlexTouch Pen Inject 46 Units into the skin daily. 30 mL 2   Insulin Pen Needle 31G X 5 MM MISC 1 Device by Does not apply route in the morning, at noon, in the evening, and at bedtime. 400 each 2   losartan (COZAAR) 100 MG tablet Take 1 tablet (100 mg total) by mouth daily. 90 tablet 3   Menthol, Topical Analgesic, (ICY HOT EX) Apply topically as needed.     metFORMIN (GLUCOPHAGE-XR) 500 MG 24  hr tablet Take 2 tablets (1,000 mg total) by mouth in the morning and at bedtime. 360 tablet 1   methocarbamol (ROBAXIN) 500 MG tablet TAKE ONE TABLET BY MOUTH FOUR TIMES DAILY 45 tablet 1   oxyCODONE (ROXICODONE) 5 MG immediate release tablet Take 1 tablet (5 mg total) by mouth every 6 (six) hours as needed for up to 20 doses for severe pain. [place]  tablet 0   pravastatin (PRAVACHOL) 40 MG tablet Take 1 tablet (40 mg total) by mouth daily. 90 tablet 1   tamsulosin (FLOMAX) 0.4 MG CAPS capsule Take 1 capsule (0.4 mg total) by mouth daily. 90 capsule 3   traMADol (ULTRAM) 50 MG tablet 2 po q6 h prn 120 tablet 1   gabapentin (NEURONTIN) 300 MG capsule Take 2 tablets tid 180 capsule 5   meloxicam (MOBIC) 7.5 MG tablet Take 1 tablet (7.5 mg total) by mouth daily. 30 tablet 0   No facility-administered medications prior to visit.    No Known Allergies  Review of Systems  Skin:        (-)Bed sores      Objective:    Physical Exam Constitutional:      General: He is not in acute distress.    Appearance: Normal appearance. He is not ill-appearing.  HENT:     Head: Normocephalic and atraumatic.     Right Ear: External ear normal.     Left Ear: External ear normal.  Eyes:     Extraocular Movements: Extraocular movements intact.     Pupils: Pupils are equal, round, and reactive to light.  Cardiovascular:     Rate and Rhythm: Normal rate and regular rhythm.     Pulses: Normal pulses.     Heart sounds: Normal heart sounds. No murmur heard.   No gallop.  Pulmonary:     Effort: Pulmonary effort is normal. No respiratory distress.     Breath sounds: Normal breath sounds. No wheezing, rhonchi or rales.  Skin:    General: Skin is warm and dry.  Neurological:     Mental Status: He is alert and oriented to person, place, and time.     Comments: 3/5 strength in left upper extremity 3/5 strength in right upper extremity 2/5 strength in left lower extremity 2/5 strength in right lower extremity   Psychiatric:        Behavior: Behavior normal.    BP 120/80 (BP Location: Right Arm, Patient Position: Sitting, Cuff Size: Normal)   Pulse 90   Temp 98.9 F (37.2 C) (Oral)   Resp 20   Ht 5\' 11"  (1.803 m)   Wt 279 lb (126.6 kg)   SpO2 100%   BMI 38.91 kg/m  Wt Readings from Last 3 Encounters:  02/18/21 280 lb (127 kg)  02/16/21 280 lb (127 kg)  02/14/21 279 lb (126.6 kg)    Diabetic Foot Exam - Simple   No data filed    Lab Results  Component Value Date   WBC 6.8 02/14/2021   HGB 12.9 (L) 02/14/2021   HCT 38.4 (L) 02/14/2021   PLT 289.0 02/14/2021   GLUCOSE 78 02/14/2021   CHOL 136 02/14/2021   TRIG 102.0 02/14/2021   HDL 55.50 02/14/2021   LDLDIRECT 124.0 05/02/2017   LDLCALC 60 02/14/2021   ALT 17 02/14/2021   AST 12 02/14/2021   NA 137 02/14/2021   K 3.7 02/14/2021   CL 101 02/14/2021   CREATININE 0.90 02/14/2021   BUN 23 02/14/2021   CO2 27 02/14/2021   TSH 2.57 02/14/2021   PSA 1.15 03/10/2020   INR 1.1 07/30/2013   HGBA1C 7.7 (H) 02/14/2021   MICROALBUR <0.7 05/02/2017    Lab Results  Component Value Date   TSH 2.57 02/14/2021   Lab Results  Component Value Date   WBC 6.8 02/14/2021   HGB 12.9 (L) 02/14/2021   HCT 38.4 (L) 02/14/2021  MCV 89.6 02/14/2021   PLT 289.0 02/14/2021   Lab Results  Component Value Date   NA 137 02/14/2021   K 3.7 02/14/2021   CO2 27 02/14/2021   GLUCOSE 78 02/14/2021   BUN 23 02/14/2021   CREATININE 0.90 02/14/2021   BILITOT 0.4 02/14/2021   ALKPHOS 60 02/14/2021   AST 12 02/14/2021   ALT 17 02/14/2021   PROT 6.4 02/14/2021   ALBUMIN 4.1 02/14/2021   CALCIUM 9.2 02/14/2021   ANIONGAP 9 02/13/2021   GFR 94.64 02/14/2021   Lab Results  Component Value Date   CHOL 136 02/14/2021   Lab Results  Component Value Date   HDL 55.50 02/14/2021   Lab Results  Component Value Date   LDLCALC 60 02/14/2021   Lab Results  Component Value Date   TRIG 102.0 02/14/2021   Lab Results  Component Value  Date   CHOLHDL 2 02/14/2021   Lab Results  Component Value Date   HGBA1C 7.7 (H) 02/14/2021       Assessment & Plan:   Problem List Items Addressed This Visit       Unprioritized   HTN (hypertension) (Chronic)   Relevant Orders   TSH (Completed)   Lipid panel (Completed)   Hemoglobin A1c (Completed)   CBC with Differential/Platelet (Completed)   Comprehensive metabolic panel (Completed)   HTN (hypertension), benign (Chronic)    Well controlled, no changes to meds. Encouraged heart healthy diet such as the DASH diet and exercise as tolerated.        Hyperlipemia (Chronic)    Encourage heart healthy diet such as MIND or DASH diet, increase exercise, avoid trans fats, simple carbohydrates and processed foods, consider a krill or fish or flaxseed oil cap daily.        Relevant Orders   TSH (Completed)   Lipid panel (Completed)   Hemoglobin A1c (Completed)   CBC with Differential/Platelet (Completed)   Comprehensive metabolic panel (Completed)   Sleep apnea, on C-pap (Chronic)    On cpap Needs pulm f/u       Chronic low back pain    Pain management this week       CIDP (chronic inflammatory demyelinating polyneuropathy) ([place])    Neuro doubts-- pt getting [facility]nd opinion at [facility] and [facility]         Relevant Orders   TSH (Completed)   Lipid panel (Completed)   Hemoglobin A1c (Completed)   CBC with Differential/Platelet (Completed)   Comprehensive metabolic panel (Completed)   Type 2 diabetes mellitus with hyperglycemia, with long-term current use of insulin ([place])    Per endo       Other Visit Diagnoses     Uncontrolled type 2 diabetes mellitus with hyperglycemia ([place])    -  Primary   Relevant Orders   TSH (Completed)   Lipid panel (Completed)   Hemoglobin A1c (Completed)   CBC with Differential/Platelet (Completed)   Comprehensive metabolic panel (Completed)        No orders of the defined types were placed in this encounter.   I, Dr.  [name], DO, personally preformed the services described in this documentation.  All medical record entries made by the scribe were at my direction and in my presence.  I have reviewed the chart and discharge instructions (if applicable) and agree that the record reflects my personal performance and is accurate and complete. 02/14/2021   I,Tyler Brewer,acting as a scribe for [employer], DO.,have documented all relevant documentation on  the behalf of [name], DO,as directed by  [name], DO while in the presence of [name], DO.  Ov >60 min face to face --- reviewing chart , examining pt and filling out paperwork for power wheelchair  [name], DO

## 2021-02-14 NOTE — Assessment & Plan Note (Signed)
Encourage heart healthy diet such as MIND or DASH diet, increase exercise, avoid trans fats, simple carbohydrates and processed foods, consider a krill or fish or flaxseed oil cap daily.  °

## 2021-02-14 NOTE — Assessment & Plan Note (Signed)
Neuro doubts-- pt getting 2nd opinion at [facility] and [place]

## 2021-02-14 NOTE — Telephone Encounter (Signed)
Noted. Pt has appt this afternoon

## 2021-02-14 NOTE — Assessment & Plan Note (Signed)
On cpap Needs pulm f/u

## 2021-02-14 NOTE — Assessment & Plan Note (Signed)
Pain management this week

## 2021-02-15 LAB — LIPID PANEL
Cholesterol: 136 mg/dL (ref 0–200)
HDL: 55.5 mg/dL (ref >39.00)
LDL Cholesterol: 60 mg/dL (ref 0–99)
NonHDL: 80.27
Total CHOL/HDL Ratio: 2
Triglycerides: 102 mg/dL (ref 0.0–149.0)
VLDL: 20.4 mg/dL (ref 0.0–40.0)

## 2021-02-15 LAB — CBC WITH DIFFERENTIAL/PLATELET
Basophils Absolute: 0.1 10*3/uL (ref 0.0–0.1)
Basophils Relative: 0.8 % (ref 0.0–3.0)
Eosinophils Absolute: 0.2 10*3/uL (ref 0.0–0.7)
Eosinophils Relative: 3.4 % (ref 0.0–5.0)
HCT: 38.4 % — ABNORMAL LOW (ref 39.0–52.0)
Hemoglobin: 12.9 g/dL — ABNORMAL LOW (ref 13.0–17.0)
Lymphocytes Relative: 21.8 % (ref 12.0–46.0)
Lymphs Abs: 1.5 10*3/uL (ref 0.7–4.0)
MCHC: 33.6 g/dL (ref 30.0–36.0)
MCV: 89.6 fl (ref 78.0–100.0)
Monocytes Absolute: 0.6 10*3/uL (ref 0.1–1.0)
Monocytes Relative: 8.9 % (ref 3.0–12.0)
Neutro Abs: 4.4 10*3/uL (ref 1.4–7.7)
Neutrophils Relative %: 65.1 % (ref 43.0–77.0)
Platelets: 289 10*3/uL (ref 150.0–400.0)
RBC: 4.28 Mil/uL (ref 4.22–5.81)
RDW: 13.6 % (ref 11.5–15.5)
WBC: 6.8 10*3/uL (ref 4.0–10.5)

## 2021-02-15 LAB — COMPREHENSIVE METABOLIC PANEL
ALT: 17 U/L (ref 0–53)
AST: 12 U/L (ref 0–37)
Albumin: 4.1 g/dL (ref 3.5–5.2)
Alkaline Phosphatase: 60 U/L (ref 39–117)
BUN: 23 mg/dL (ref 6–23)
CO2: 27 mEq/L (ref 19–32)
Calcium: 9.2 mg/dL (ref 8.4–10.5)
Chloride: 101 mEq/L (ref 96–112)
Creatinine, Ser: 0.9 mg/dL (ref 0.40–1.50)
GFR: 94.64 mL/min (ref >60.00)
Glucose, Bld: 78 mg/dL (ref 70–99)
Potassium: 3.7 mEq/L (ref 3.5–5.1)
Sodium: 137 mEq/L (ref 135–145)
Total Bilirubin: 0.4 mg/dL (ref 0.2–1.2)
Total Protein: 6.4 g/dL (ref 6.0–8.3)

## 2021-02-15 LAB — HEMOGLOBIN A1C: Hgb A1c MFr Bld: 7.7 % — ABNORMAL HIGH (ref 4.6–6.5)

## 2021-02-15 LAB — TSH: TSH: 2.57 u[IU]/mL (ref 0.35–4.50)

## 2021-02-16 ENCOUNTER — Encounter: Payer: Self-pay | Admitting: Physical Medicine & Rehabilitation

## 2021-02-16 ENCOUNTER — Encounter: Payer: Medicare HMO | Attending: Physical Medicine & Rehabilitation | Admitting: Physical Medicine & Rehabilitation

## 2021-02-16 ENCOUNTER — Other Ambulatory Visit: Payer: Self-pay

## 2021-02-16 DIAGNOSIS — G8929 Other chronic pain: Secondary | ICD-10-CM

## 2021-02-16 DIAGNOSIS — M545 Low back pain, unspecified: Secondary | ICD-10-CM | POA: Diagnosis not present

## 2021-02-16 MED ORDER — MELOXICAM 15 MG PO TABS: 15.0000 mg | ORAL_TABLET | Freq: Every day | ORAL | 1 refills | Status: DC

## 2021-02-16 MED ORDER — PREGABALIN 75 MG PO CAPS: 75.0000 mg | ORAL_CAPSULE | Freq: Two times a day (BID) | ORAL | 1 refills | Status: DC

## 2021-02-16 NOTE — Progress Notes (Signed)
Subjective:    Patient ID: Tyler Brewer, male    DOB: Oct 18, 1960, 60 y.o.   MRN: [id number]  HPI Chief complaint low back pain 60 year old male who gives a several year history of low back pain.  He was referred by his primary physician for evaluation.  The patient has several comorbid conditions.  He has been seen by neurology who diagnosed diabetic polyradiculopathy.  He had been previously hospitalized and treated for CIDP.  Chronic left foot drop but has difficulty wearing left AFO due to bilateral foot swelling. His low back pain is left-sided greater than right-sided.  He does not feel like it radiates down into his leg. No new bowel or bladder dysfunction The patient has had several recent ED visits most recently for syncope which was felt to be related to hypotension associated with recent increase in Lasix dosage.  This was preceded by an ED visit at another hospital where Lasix was increased for bilateral foot swelling.  Another ED visit 02/04/2021 for drug-induced constipation.  He did have urine toxicology positive for opiates on 01/10/2021. The patient was seen by Dr. Posey Pronto in June 2022 from neurology who diagnosed diabetic polyradiculo neuropathy favored over CIDP. The patient was seen by neurosurgery Dr. Kathyrn Sheriff in February 2022 and had EMG performed by Dr. Brien Few although results are not available to me. The patient had MRI of the cervical thoracic and lumbar spine all of which were reviewed.  Cervical and thoracic MRIs were unremarkable lumbar MRI did show left greater than right facet arthropathyL4-5, L5-S1 Patient also has been seen by orthopedics for hip pain suspected labral tear. Social history of methamphetamine abuse, denies current use.  States he never had an issue with opiates.  Has history of depression, suicide ideation in 2013  ED visit in November 2021 with suicidal ideation, plan to overdose. PHQ-9 scores 14 today Pain Inventory Average Pain 9 Pain Right Now  9 My pain is constant, sharp, burning, tingling, and aching  In the last 24 hours, has pain interfered with the following? General activity 8 Relation with others 6 Enjoyment of life 10 What TIME of day is your pain at its worst? night Sleep (in general) Poor  Pain is worse with: walking and sitting Pain improves with: rest, heat/ice, and medication Relief from Meds: 8  walk with assistance use a cane how many minutes can you walk? 1-2 ability to climb steps?  no do you drive?  no use a wheelchair needs help with transfers  disabled: date disabled . retired I need assistance with the following:  meal prep, household duties, and shopping  bladder control problems weakness numbness tremor tingling trouble walking dizziness depression  New pt  New pt    Family History  Problem Relation Age of Onset   Diabetes type II Father    Hypertension Father    Pancreatic disease Father 32       Deceased   Healthy Mother    Healthy Sister    Healthy Son    Healthy Daughter    Parkinson's disease Maternal Grandmother    Social History   Socioeconomic History   Marital status: Single    Spouse name: Not on file   Number of children: 2   Years of education: Not on file   Highest education level: Not on file  Occupational History   Not on file  Tobacco Use   Smoking status: Every Day    Packs/day: 0.50    Pack years: 0.00  Types: Cigarettes    Last attempt to quit: 09/29/2020    Years since quitting: 0.3   Smokeless tobacco: Never  Vaping Use   Vaping Use: Never used  Substance and Sexual Activity   Alcohol use: No   Drug use: Not Currently    Types: Methamphetamines   Sexual activity: Yes  Other Topics Concern   Not on file  Social History Narrative     Has 2 grown children.     Works as a Barrister's clerk.  Education: Oceanographer.   Right Handed   Drinks Caffeine    Mother recently moved and sold her house-- pt is living with a friend in a trailer     Social Determinants of Health   Financial Resource Strain: Medium Risk   Difficulty of Paying Living Expenses: Somewhat hard  Food Insecurity: No Food Insecurity   Worried About Charity fundraiser in the Last Year: Never true   Arboriculturist in the Last Year: Never true  Transportation Needs: Unmet Transportation Needs   Lack of Transportation (Medical): Yes   Lack of Transportation (Non-Medical): Yes  Physical Activity: Not on file  Stress: Not on file  Social Connections: Not on file   Past Surgical History:  Procedure Laterality Date   CARDIAC CATHETERIZATION     IR Wall CV LINE RIGHT  11/10/2020   IR US GUIDE VASC ACCESS RIGHT  11/10/2020   [place] N/A 03/31/2012   Procedure: [place];  Surgeon: Leonie Man, MD;  Location: [facility] CATH LAB;  Service: Cardiovascular;  Laterality: N/A;   Past Medical History:  Diagnosis Date   Arthritis    CAD (coronary artery disease)  cardiac cath with moderate disease in a septal branch of the ramus intermedius 04/01/2012   Depression    Diabetes mellitus    poorly controlled by his report   History of narcotic addiction ([place])    past history of back pain   Hypercholesteremia    Hypertension    IBS (irritable bowel syndrome)    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[facility])    Neuropathy    Obesity    Max weight was 390   OSA on CPAP    Panic attacks    Testosterone deficiency    Vertigo    BP 95/62 (BP Location: Right Arm, Patient Position: Sitting, Cuff Size: Large)   Pulse 87   Temp 97.9 F (36.6 C) (Oral)   Ht 5\' 11"  (1.803 m)   Wt 280 lb (127 kg)   SpO2 96%   BMI 39.05 kg/m   Opioid Risk Score:   Fall Risk Score:  `1  Depression screen PHQ 2/9  Depression screen [facility] 2/9 01/04/2021 11/28/2020  Decreased Interest 2 0  Down, Depressed, Hopeless 2 0  PHQ - 2 Score 4 0  Altered sleeping 1 -  Tired, decreased energy 1 -  Change in appetite  0 -  Feeling bad or failure about yourself  0 -  Trouble concentrating 0 -  Moving slowly or fidgety/restless 1 -  Suicidal thoughts 0 -  PHQ-9 Score 7 -  Difficult doing work/chores Very difficult -  Some recent data might be hidden      Review of Systems  Gastrointestinal:  Positive for constipation.  Musculoskeletal:  Positive for back pain and gait problem.       Bilateral leg pain Bilateral feet pain  Neurological:  Positive for dizziness, tremors, weakness and  numbness.  Psychiatric/Behavioral:  Positive for dysphoric mood. The patient is nervous/anxious.   All other systems reviewed and are negative.     Objective:   Physical Exam Vitals and nursing note reviewed.  Constitutional:      Appearance: He is obese.  HENT:     Head: Normocephalic and atraumatic.  Eyes:     Extraocular Movements: Extraocular movements intact.     Conjunctiva/sclera: Conjunctivae normal.     Pupils: Pupils are equal, round, and reactive to light.  Cardiovascular:     Rate and Rhythm: Normal rate and regular rhythm.     Heart sounds: No murmur heard. Pulmonary:     Effort: Pulmonary effort is normal.     Breath sounds: Normal breath sounds.  Abdominal:     General: Abdomen is flat. There is distension.     Palpations: Abdomen is soft.  Neurological:     General: No focal deficit present.     Mental Status: He is alert and oriented to person, place, and time.     Motor: No tremor.     Gait: Gait abnormal.     Deep Tendon Reflexes:     Reflex Scores:      Achilles reflexes are 0 on the right side and 0 on the left side.    Comments: 5/5 bilateral deltoid, bicep, tricep, grip 4/5 hip flexors 4 - right knee extensor with giveaway weakness 4 - left knee extensor with giveaway weakness 0/5 left ankle dorsiflexor 5/5 right ankle dorsiflexor Sensation intact to light touch bilateral feet  Ambulates with a steppage gait on left.   Psychiatric:        Mood and Affect: Mood normal.         Behavior: Behavior normal.          Assessment & Plan:   1.  Left greater right low back pain concordant with MRI results showing (right facet arthropathy lower lumbar levels.  We discussed because of his past history would not recommend chronic narcotic analgesic use.  He has difficulty working with PT at the current time given his other medical comorbidities. We discussed lumbar medial branch blocks on the left side as a diagnostic procedure and to see whether he would benefit from a longer lasting procedure called radiofrequency neurotomy.  He would like to proceed with this. 2.  Neuropathic pain with left foot drop probable diabetic neuropathy, he has pain in his lower extremities which does not seem to respond to gabapentin.  Will reduce to 300 mg 3 times daily as we start Lyrica 75 mg twice daily.  He can drop the gabapentin after 1 week.  We may need to increase the Lyrica dose.  #3.  Left foot drop may benefit from double metal upright AFO which would not be affected by his fluctuating lower extremity edema. Once back pain improves would make referral to outpatient PT  4.  History of depression would recommend that patient reestablish with psychiatry.

## 2021-02-16 NOTE — Patient Instructions (Signed)
Lumbar medial branch blocks on left to block pain from facet joints   Reduce gabapentin to 1 tablet 3 x per day for a week while starting lyrica 75mg  twice a day then stop gabapentin after being on Lyrica for 1 wk

## 2021-02-17 ENCOUNTER — Encounter: Payer: Self-pay | Admitting: Family Medicine

## 2021-02-17 ENCOUNTER — Emergency Department (HOSPITAL_BASED_OUTPATIENT_CLINIC_OR_DEPARTMENT_OTHER)
Admission: EM | Admit: 2021-02-17 | Discharge: 2021-02-18 | Disposition: A | Discharge status: Home / Self Care | Payer: Medicare HMO | Attending: Emergency Medicine | Admitting: Emergency Medicine

## 2021-02-17 ENCOUNTER — Telehealth: Payer: Medicare HMO

## 2021-02-17 DIAGNOSIS — I2510 Atherosclerotic heart disease of native coronary artery without angina pectoris: Secondary | ICD-10-CM | POA: Insufficient documentation

## 2021-02-17 DIAGNOSIS — R42 Dizziness and giddiness: Secondary | ICD-10-CM

## 2021-02-17 DIAGNOSIS — Z79899 Other long term (current) drug therapy: Secondary | ICD-10-CM | POA: Insufficient documentation

## 2021-02-17 DIAGNOSIS — R079 Chest pain, unspecified: Secondary | ICD-10-CM | POA: Diagnosis not present

## 2021-02-17 DIAGNOSIS — R0789 Other chest pain: Secondary | ICD-10-CM | POA: Diagnosis not present

## 2021-02-17 DIAGNOSIS — Z7984 Long term (current) use of oral hypoglycemic drugs: Secondary | ICD-10-CM | POA: Insufficient documentation

## 2021-02-17 DIAGNOSIS — Z743 Need for continuous supervision: Secondary | ICD-10-CM | POA: Diagnosis not present

## 2021-02-17 DIAGNOSIS — F17210 Nicotine dependence, cigarettes, uncomplicated: Secondary | ICD-10-CM | POA: Insufficient documentation

## 2021-02-17 DIAGNOSIS — I10 Essential (primary) hypertension: Secondary | ICD-10-CM | POA: Insufficient documentation

## 2021-02-17 DIAGNOSIS — Z794 Long term (current) use of insulin: Secondary | ICD-10-CM | POA: Insufficient documentation

## 2021-02-17 DIAGNOSIS — E119 Type 2 diabetes mellitus without complications: Secondary | ICD-10-CM | POA: Insufficient documentation

## 2021-02-17 DIAGNOSIS — M79603 Pain in arm, unspecified: Secondary | ICD-10-CM | POA: Diagnosis not present

## 2021-02-17 DIAGNOSIS — R69 Illness, unspecified: Secondary | ICD-10-CM | POA: Diagnosis not present

## 2021-02-17 NOTE — Telephone Encounter (Signed)
Na

## 2021-02-18 ENCOUNTER — Other Ambulatory Visit: Payer: Self-pay

## 2021-02-18 ENCOUNTER — Encounter (HOSPITAL_BASED_OUTPATIENT_CLINIC_OR_DEPARTMENT_OTHER): Payer: Self-pay | Admitting: Emergency Medicine

## 2021-02-18 DIAGNOSIS — R42 Dizziness and giddiness: Secondary | ICD-10-CM | POA: Diagnosis not present

## 2021-02-18 MED ORDER — MECLIZINE HCL 25 MG PO TABS: 25.0000 mg | ORAL_TABLET | Freq: Three times a day (TID) | ORAL | 0 refills | Status: DC | PRN

## 2021-02-18 MED ORDER — MECLIZINE HCL 25 MG PO TABS
25.0000 mg | ORAL_TABLET | Freq: Once | ORAL | Status: AC
  Administered 2021-02-18: 25 mg via ORAL
  Filled 2021-02-18: qty 1

## 2021-02-18 NOTE — ED Notes (Signed)
Advised pt that he does not qualify for transport home via [place]. Spoke with [facility] and they would not transport due to pt being able to ambulate. Pt demanding an [employer]or, [employer] ride. Advised pt that hospital does not have an [place] account. Pt states "you will give me an [place] Popper or I'll sue your ass." Will give pt cab voucher. Pt is agreeable to this plan.

## 2021-02-18 NOTE — ED Triage Notes (Signed)
Patient arrived via EMS c/o dizziness with left sided chest pain radiating to left arm. Patient states this occurred at 2330. Patient states that dizziness and chest pain have resolved at this time. Patient is AO x 4, VS WDL, altered gait.

## 2021-02-18 NOTE — ED Provider Notes (Signed)
[facility] Provider Note: Georgena Spurling, MD, FACEP  CSN: [id number] MRN: [id number] ARRIVAL: 02/17/21 at [place]: [place]  Dizziness   HISTORY OF PRESENT ILLNESS  02/18/21 2:07 AM Tyler Brewer is a 60 y.o. male with what his neurologist believe is chronic inflammatory demyelinating polyneuropathy.  He states he is effectively paraplegic but has some movement in his legs.  He is here with an episode of dizziness which began about 11:30 PM yesterday evening.  He describes the dizziness as a sensation of the room spinning.  It occurred when he stood up.  It was not worse with movement of the head and he had no associated nausea or vomiting.  He has had no tinnitus.  While this occurred he had a sharp, electric-like pain into his left arm (not his chest).  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  His symptoms lasted about an hour and gradually resolved without treatment.  He is asymptomatic now.   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[place])    Neuropathy    Obesity    Max weight was 390   OSA on CPAP    Panic attacks    Testosterone deficiency    Vertigo     Past Surgical History:  Procedure Laterality Date   CARDIAC CATHETERIZATION     IR FLUORO GUIDE CV LINE RIGHT  11/10/2020   IR US GUIDE VASC ACCESS RIGHT  11/10/2020   LEFT HEART CATHETERIZATION WITH CORONARY ANGIOGRAM N/A 03/31/2012   Procedure: LEFT HEART CATHETERIZATION WITH CORONARY ANGIOGRAM;  Surgeon: Leonie Man, MD;  Location: [facility] CATH LAB;  Service: Cardiovascular;  Laterality:  N/A;    Family History  Problem Relation Age of Onset   Diabetes type II Father    Hypertension Father    Pancreatic disease Father 87       Deceased   Healthy Mother    Healthy Sister    Healthy Son    Healthy Daughter    Parkinson's disease Maternal Grandmother     Social History   Tobacco Use   Smoking status: Every Day    Packs/day: 0.50    Pack years: 0.00    Types: Cigarettes    Last attempt to quit: 09/29/2020    Years since quitting: 0.3   Smokeless tobacco: Never  Vaping Use   Vaping Use: Never used  Substance Use Topics   Alcohol use: No   Drug use: Not Currently    Types: Methamphetamines    Prior to Admission medications   Medication Sig Start Date End Date Taking? Authorizing Provider  acetaminophen (TYLENOL) 500 MG tablet Take 500 mg by mouth every 6 (six) hours as needed. States he is taking one to two tablets a day    [provider]  amLODipine (NORVASC) 10 MG tablet Take 1 tablet (10 mg total) by mouth daily. 01/26/21   [name], DO  DULoxetine (CYMBALTA) 30 MG capsule Take 1 capsule (30 mg total) by  mouth daily. Patient taking differently: Take 30 mg by mouth 3 (three) times a week. 07/19/20   [name] R, DO  furosemide (LASIX) 20 MG tablet TAKE 1 TABLET(20 MG) BY MOUTH DAILY 12/21/20   Carollee Herter, Alferd Apa, DO  gabapentin (NEURONTIN) 300 MG capsule Take 600 mg by mouth 3 (three) times daily. 02/16/21   [provider]  hydrALAZINE (APRESOLINE) 50 MG tablet Take 1 tablet (50 mg total) by mouth every 8 (eight) hours. 01/26/21   [name], DO  ibuprofen (ADVIL) 200 MG tablet Take 200 mg by mouth every 6 (six) hours as needed.    [provider]  insulin aspart (NOVOLOG FLEXPEN) 100 UNIT/ML FlexPen Inject 12 Units into the skin 3 (three) times daily with meals. 01/03/21   [name], Melanie Crazier, MD  insulin degludec (TRESIBA FLEXTOUCH) 200 UNIT/ML FlexTouch Pen Inject 46 Units into the skin daily.  01/03/21   [name], Melanie Crazier, MD  Insulin Pen Needle 31G X 5 MM MISC 1 Device by Does not apply route in the morning, at noon, in the evening, and at bedtime. 01/03/21   [name], Melanie Crazier, MD  losartan (COZAAR) 100 MG tablet Take 1 tablet (100 mg total) by mouth daily. 01/26/21   [name], DO  meloxicam (MOBIC) 15 MG tablet Take 1 tablet (15 mg total) by mouth daily. 02/16/21   Kirsteins, Luanna Salk, MD  Menthol, Topical Analgesic, (ICY HOT EX) Apply topically as needed.    [provider]  metFORMIN (GLUCOPHAGE-XR) 500 MG 24 hr tablet Take 2 tablets (1,000 mg total) by mouth in the morning and at bedtime. 01/26/21   [name], DO  methocarbamol (ROBAXIN) 500 MG tablet TAKE ONE TABLET BY MOUTH FOUR TIMES DAILY 02/13/21   Carollee Herter, Alferd Apa, DO  oxyCODONE (ROXICODONE) 5 MG immediate release tablet Take 1 tablet (5 mg total) by mouth every 6 (six) hours as needed for up to 20 doses for severe pain. 02/03/21   [name], DO  pravastatin (PRAVACHOL) 40 MG tablet Take 1 tablet (40 mg total) by mouth daily. 01/26/21   [name], DO  pregabalin (LYRICA) 75 MG capsule Take 1 capsule (75 mg total) by mouth 2 (two) times daily. 02/16/21   Kirsteins, Luanna Salk, MD  tamsulosin (FLOMAX) 0.4 MG CAPS capsule Take 1 capsule (0.4 mg total) by mouth daily. 01/26/21   [name], DO  traMADol (ULTRAM) 50 MG tablet 2 po q6 h prn 02/03/21   [name], DO    Allergies Patient has no known allergies.   REVIEW OF SYSTEMS  Negative except as noted here or in the History of Present Illness.   PHYSICAL EXAMINATION  Initial Vital Signs Blood pressure (!) 145/73, pulse 85, temperature 98 F (36.7 C), temperature source Oral, resp. rate 20, height 5\' 11"  (1.803 m), weight 127 kg, SpO2 100 %.  Examination General: Well-developed, high BMI male in no acute distress; appearance consistent with age of record HENT: normocephalic;  atraumatic; TMs normal Eyes: pupils equal, round and reactive to light; extraocular muscles intact; no nystagmus Neck: supple Heart: regular rate and rhythm Lungs: clear to auscultation bilaterally Abdomen: soft; nondistended; nontender; bowel sounds present Extremities: No deformity; full range of motion; trace edema of lower legs Neurologic: Awake, alert and oriented; paraparesis; no facial droop Skin: Warm and dry Psychiatric: Normal mood and affect   RESULTS  Summary of this visit's results, reviewed  and interpreted by myself:   EKG Interpretation  Date/Time:  Saturday February 18 2021 00:09:00 EDT Ventricular Rate:  85 PR Interval:  190 QRS Duration: 97 QT Interval:  371 QTC Calculation: 442 R Axis:   27 Text Interpretation: Sinus rhythm Normal ECG No significant change was found Confirmed by Xochitl Egle [phone]) on 02/18/2021 1:30:00 AM        Laboratory Studies: No results found for this or any previous visit (from the past 24 hour(s)). Imaging Studies: No results found.  ED COURSE and MDM  Nursing notes, initial and subsequent vitals signs, including pulse oximetry, reviewed and interpreted by myself.  Vitals:   02/18/21 0001 02/18/21 0005 02/18/21 0006  BP:  (!) 145/73   Pulse:  85   Resp:  20   Temp:  98 F (36.7 C)   TempSrc:  Oral   SpO2: 99% 100%   Weight:   127 kg  Height:   5\' 11"  (1.803 m)   Medications  meclizine (ANTIVERT) tablet 25 mg (has no administration in time range)    The patient's symptoms may be related to his neurologic condition.  Since his dizziness occurred when he stood up to suggest peripheral vertigo but there was no change with head movement.  Since his symptoms have resolved I do not believe any acute intervention is required.  The patient states he would like to go home.  We will trial meclizine.  PROCEDURES  Procedures   ED DIAGNOSES     ICD-10-CM   1. Vertigo  R42          Elijah Michaelis, Jenny Reichmann, MD 02/18/21 [phone]

## 2021-02-20 ENCOUNTER — Encounter: Payer: Self-pay | Admitting: Family Medicine

## 2021-02-24 ENCOUNTER — Telehealth: Payer: Self-pay

## 2021-02-24 NOTE — Telephone Encounter (Signed)
Prior authorization for pregabalin approved

## 2021-02-28 ENCOUNTER — Telehealth: Payer: Self-pay | Admitting: Family Medicine

## 2021-02-28 ENCOUNTER — Encounter: Payer: Self-pay | Admitting: Family Medicine

## 2021-02-28 ENCOUNTER — Telehealth: Payer: Medicare HMO

## 2021-02-28 NOTE — Telephone Encounter (Signed)
Forms corrected resent

## 2021-02-28 NOTE — Telephone Encounter (Signed)
Tyler Brewer states he got a script for Mobility device. However it should be for  OT for Mobility evaluation script.  Please update and resend

## 2021-03-02 MED ORDER — METAXALONE 400 MG PO TABS: 400.0000 mg | ORAL_TABLET | Freq: Three times a day (TID) | ORAL | 0 refills | Status: DC | PRN

## 2021-03-06 ENCOUNTER — Encounter: Payer: Self-pay | Admitting: Family Medicine

## 2021-03-12 ENCOUNTER — Encounter: Payer: Self-pay | Admitting: Family Medicine

## 2021-03-12 DIAGNOSIS — E1165 Type 2 diabetes mellitus with hyperglycemia: Secondary | ICD-10-CM | POA: Diagnosis not present

## 2021-03-13 ENCOUNTER — Telehealth: Payer: Medicare HMO

## 2021-03-13 ENCOUNTER — Other Ambulatory Visit: Payer: Self-pay | Admitting: Family Medicine

## 2021-03-13 DIAGNOSIS — G8929 Other chronic pain: Secondary | ICD-10-CM

## 2021-03-13 DIAGNOSIS — M545 Low back pain, unspecified: Secondary | ICD-10-CM

## 2021-03-13 NOTE — Telephone Encounter (Signed)
Looks like this is for "Medication refills"

## 2021-03-14 MED ORDER — MELOXICAM 15 MG PO TABS: 15.0000 mg | ORAL_TABLET | Freq: Every day | ORAL | 1 refills | Status: DC

## 2021-03-14 MED ORDER — PREGABALIN 75 MG PO CAPS: 75.0000 mg | ORAL_CAPSULE | Freq: Two times a day (BID) | ORAL | 1 refills | Status: DC

## 2021-03-14 NOTE — Telephone Encounter (Signed)
Requesting: oxycodone Contract: none UDS: none Last Visit: 02/14/21 Next Visit: 03/16/21 Last Refill:02/03/21  Please Advise

## 2021-03-16 ENCOUNTER — Ambulatory Visit: Payer: Medicare HMO | Admitting: Family Medicine

## 2021-03-16 ENCOUNTER — Emergency Department (HOSPITAL_BASED_OUTPATIENT_CLINIC_OR_DEPARTMENT_OTHER)
Admission: EM | Admit: 2021-03-16 | Discharge: 2021-03-16 | Disposition: A | Discharge status: Home / Self Care | Payer: Medicare HMO | Attending: Emergency Medicine | Admitting: Emergency Medicine

## 2021-03-16 ENCOUNTER — Other Ambulatory Visit: Payer: Self-pay

## 2021-03-16 ENCOUNTER — Encounter (HOSPITAL_BASED_OUTPATIENT_CLINIC_OR_DEPARTMENT_OTHER): Payer: Self-pay

## 2021-03-16 DIAGNOSIS — G629 Polyneuropathy, unspecified: Secondary | ICD-10-CM

## 2021-03-16 DIAGNOSIS — I10 Essential (primary) hypertension: Secondary | ICD-10-CM | POA: Diagnosis not present

## 2021-03-16 DIAGNOSIS — I2510 Atherosclerotic heart disease of native coronary artery without angina pectoris: Secondary | ICD-10-CM | POA: Insufficient documentation

## 2021-03-16 DIAGNOSIS — Z7984 Long term (current) use of oral hypoglycemic drugs: Secondary | ICD-10-CM | POA: Diagnosis not present

## 2021-03-16 DIAGNOSIS — R531 Weakness: Secondary | ICD-10-CM | POA: Diagnosis not present

## 2021-03-16 DIAGNOSIS — E1140 Type 2 diabetes mellitus with diabetic neuropathy, unspecified: Secondary | ICD-10-CM | POA: Diagnosis not present

## 2021-03-16 DIAGNOSIS — Z79899 Other long term (current) drug therapy: Secondary | ICD-10-CM | POA: Insufficient documentation

## 2021-03-16 DIAGNOSIS — F17210 Nicotine dependence, cigarettes, uncomplicated: Secondary | ICD-10-CM | POA: Insufficient documentation

## 2021-03-16 DIAGNOSIS — Z743 Need for continuous supervision: Secondary | ICD-10-CM | POA: Diagnosis not present

## 2021-03-16 DIAGNOSIS — R5381 Other malaise: Secondary | ICD-10-CM | POA: Diagnosis not present

## 2021-03-16 DIAGNOSIS — M7989 Other specified soft tissue disorders: Secondary | ICD-10-CM | POA: Diagnosis not present

## 2021-03-16 DIAGNOSIS — R5383 Other fatigue: Secondary | ICD-10-CM | POA: Diagnosis not present

## 2021-03-16 DIAGNOSIS — R609 Edema, unspecified: Secondary | ICD-10-CM | POA: Diagnosis not present

## 2021-03-16 DIAGNOSIS — R600 Localized edema: Secondary | ICD-10-CM | POA: Insufficient documentation

## 2021-03-16 DIAGNOSIS — E1142 Type 2 diabetes mellitus with diabetic polyneuropathy: Secondary | ICD-10-CM | POA: Insufficient documentation

## 2021-03-16 DIAGNOSIS — Z955 Presence of coronary angioplasty implant and graft: Secondary | ICD-10-CM | POA: Insufficient documentation

## 2021-03-16 DIAGNOSIS — Z794 Long term (current) use of insulin: Secondary | ICD-10-CM | POA: Insufficient documentation

## 2021-03-16 DIAGNOSIS — R69 Illness, unspecified: Secondary | ICD-10-CM | POA: Diagnosis not present

## 2021-03-16 LAB — CBC WITH DIFFERENTIAL/PLATELET
Abs Immature Granulocytes: 0.01 10*3/uL (ref 0.00–0.07)
Basophils Absolute: 0.1 10*3/uL (ref 0.0–0.1)
Basophils Relative: 1 %
Eosinophils Absolute: 0.3 10*3/uL (ref 0.0–0.5)
Eosinophils Relative: 5 %
HCT: 39 % (ref 39.0–52.0)
Hemoglobin: 13.4 g/dL (ref 13.0–17.0)
Immature Granulocytes: 0 %
Lymphocytes Relative: 28 %
Lymphs Abs: 1.7 10*3/uL (ref 0.7–4.0)
MCH: 30.5 pg (ref 26.0–34.0)
MCHC: 34.4 g/dL (ref 30.0–36.0)
MCV: 88.6 fL (ref 80.0–100.0)
Monocytes Absolute: 0.6 10*3/uL (ref 0.1–1.0)
Monocytes Relative: 10 %
Neutro Abs: 3.4 10*3/uL (ref 1.7–7.7)
Neutrophils Relative %: 56 %
Platelets: 243 10*3/uL (ref 150–400)
RBC: 4.4 MIL/uL (ref 4.22–5.81)
RDW: 12.3 % (ref 11.5–15.5)
WBC: 6 10*3/uL (ref 4.0–10.5)
nRBC: 0 % (ref 0.0–0.2)

## 2021-03-16 LAB — COMPREHENSIVE METABOLIC PANEL
ALT: 29 U/L (ref 0–44)
AST: 22 U/L (ref 15–41)
Albumin: 3.8 g/dL (ref 3.5–5.0)
Alkaline Phosphatase: 76 U/L (ref 38–126)
Anion gap: 8 (ref 5–15)
BUN: 21 mg/dL — ABNORMAL HIGH (ref 6–20)
CO2: 25 mmol/L (ref 22–32)
Calcium: 9.6 mg/dL (ref 8.9–10.3)
Chloride: 105 mmol/L (ref 98–111)
Creatinine, Ser: 0.82 mg/dL (ref 0.61–1.24)
GFR, Estimated: >60 mL/min (ref >60)
Glucose, Bld: 148 mg/dL — ABNORMAL HIGH (ref 70–99)
Potassium: 3.8 mmol/L (ref 3.5–5.1)
Sodium: 138 mmol/L (ref 135–145)
Total Bilirubin: 0.5 mg/dL (ref 0.3–1.2)
Total Protein: 6.8 g/dL (ref 6.5–8.1)

## 2021-03-16 LAB — BRAIN NATRIURETIC PEPTIDE: B Natriuretic Peptide: 14.6 pg/mL (ref 0.0–100.0)

## 2021-03-16 NOTE — Discharge Instructions (Addendum)
Call your primary care doctor or specialist as discussed in the next 2-3 days.   Return immediately back to the ER if:  Your symptoms worsen within the next 12-24 hours. You develop new symptoms such as new fevers, persistent vomiting, new pain, shortness of breath, or new weakness or numbness, or if you have any other concerns.  

## 2021-03-16 NOTE — Progress Notes (Incomplete)
Subjective:   By signing my name below, I, Tyler Brewer, attest that this documentation has been prepared under the direction and in the presence of Dr. [name], DO. 03/16/2021    Patient ID: Tyler Brewer, male    DOB: 1961-06-11, 60 y.o.   MRN: [id number]  No chief complaint on file.   HPI Patient is in today for a office visit.  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[place])    Neuropathy    Obesity    Max weight was 390   OSA on CPAP    Panic attacks    Testosterone deficiency    Vertigo     Past Surgical History:  Procedure Laterality Date   CARDIAC CATHETERIZATION     IR FLUORO GUIDE CV LINE RIGHT  11/10/2020   IR US GUIDE VASC ACCESS RIGHT  11/10/2020   LEFT HEART CATHETERIZATION WITH CORONARY ANGIOGRAM N/A 03/31/2012   Procedure: LEFT HEART CATHETERIZATION WITH CORONARY ANGIOGRAM;  Surgeon: Leonie Man, MD;  Location: [facility] CATH LAB;  Service: Cardiovascular;  Laterality: N/A;    Family History  Problem Relation Age of Onset   Diabetes type II Father    Hypertension Father    Pancreatic disease Father 61       Deceased   Healthy Mother    Healthy Sister    Healthy Son    Healthy Daughter    Parkinson's disease Maternal Grandmother     Social History   Socioeconomic History   Marital status: Single    Spouse name: Not on file   Number of children: 2   Years of education: Not on file   Highest education level: Not on file  Occupational History   Not on file  Tobacco Use   Smoking status: Every Day    Packs/day: 0.50    Types: Cigarettes    Last attempt to quit:  09/29/2020    Years since quitting: 0.4   Smokeless tobacco: Never  Vaping Use   Vaping Use: Never used  Substance and Sexual Activity   Alcohol use: No   Drug use: Not Currently    Types: Methamphetamines   Sexual activity: Yes  Other Topics Concern   Not on file  Social History Narrative     Has 2 grown children.     Works as a Barrister's clerk.  Education: Oceanographer.   Right Handed   Drinks Caffeine    Mother recently moved and sold her house-- pt is living with a friend in a trailer    Social Determinants of Health   Financial Resource Strain: Medium Risk   Difficulty of Paying Living Expenses: Somewhat hard  Food Insecurity: No Food Insecurity   Worried About Charity fundraiser in the Last Year: Never true   Arboriculturist in the Last Year: Never true  Transportation Needs: Unmet Transportation Needs   Lack of Transportation (Medical): Yes   Lack of Transportation (Non-Medical): Yes  Physical Activity: Not on file  Stress: Not on file  Social Connections: Not on file  Intimate Partner Violence: Not on file    Outpatient Medications Prior to Visit  Medication Sig Dispense Refill   acetaminophen (TYLENOL) 500 MG tablet Take 500 mg by mouth every 6 (six) hours as needed. States he is taking one to two tablets a day     amLODipine (NORVASC) 10 MG tablet Take 1 tablet (10 mg total) by mouth daily. 90 tablet 1   DULoxetine (CYMBALTA) 30 MG capsule Take 1 capsule (30 mg total) by mouth daily. (Patient taking differently: Take 30 mg by mouth 3 (three) times a week.) 60 capsule 3   furosemide (LASIX) 20 MG tablet TAKE 1 TABLET(20 MG) BY MOUTH DAILY 90 tablet 0   gabapentin (NEURONTIN) 300 MG capsule Take 600 mg by mouth 3 (three) times daily.     hydrALAZINE (APRESOLINE) 50 MG tablet Take 1 tablet (50 mg total) by mouth every 8 (eight) hours. 90 tablet 0   ibuprofen (ADVIL) 200 MG tablet Take 200 mg by mouth every 6 (six) hours as needed.     insulin aspart (NOVOLOG FLEXPEN) 100  UNIT/ML FlexPen Inject 12 Units into the skin 3 (three) times daily with meals. 30 mL 2   insulin degludec (TRESIBA FLEXTOUCH) 200 UNIT/ML FlexTouch Pen Inject 46 Units into the skin daily. 30 mL 2   Insulin Pen Needle 31G X 5 MM MISC 1 Device by Does not apply route in the morning, at noon, in the evening, and at bedtime. 400 each 2   losartan (COZAAR) 100 MG tablet Take 1 tablet (100 mg total) by mouth daily. 90 tablet 3   meclizine (ANTIVERT) 25 MG tablet Take 1 tablet (25 mg total) by mouth 3 (three) times daily as needed for dizziness. 30 tablet 0   meloxicam (MOBIC) 15 MG tablet Take 1 tablet (15 mg total) by mouth daily. 30 tablet 1   Menthol, Topical Analgesic, (ICY HOT EX) Apply topically as needed.     metaxalone (SKELAXIN) 400 MG tablet Take 1 tablet (400 mg total) by mouth 3 (three) times daily as needed. 30 tablet 0   metFORMIN (GLUCOPHAGE-XR) 500 MG 24 hr tablet Take 2 tablets (1,000 mg total) by mouth in the morning and at bedtime. 360 tablet 1   oxyCODONE (ROXICODONE) 5 MG immediate release tablet Take 1 tablet (5 mg total) by mouth every 6 (six) hours as needed for up to 20 doses for severe pain. 45 tablet 0   pravastatin (PRAVACHOL) 40 MG tablet Take 1 tablet (40 mg total) by mouth daily. 90 tablet 1   pregabalin (LYRICA) 75 MG capsule Take 1 capsule (75 mg total) by mouth 2 (two) times daily. 60 capsule 1   tamsulosin (FLOMAX) 0.4 MG CAPS capsule Take 1 capsule (0.4 mg total) by mouth daily. 90 capsule 3   traMADol (ULTRAM) 50 MG tablet 2 po q6 h prn 120 tablet 1   No facility-administered medications prior to visit.    No Known Allergies  ROS     Objective:    Physical Exam Constitutional:      General: He is not in acute distress.    Appearance: Normal appearance. He is not ill-appearing.  HENT:     Head: Normocephalic and atraumatic.     Right Ear: External ear normal.     Left Ear: External ear normal.  Eyes:     Extraocular Movements: Extraocular movements  intact.     Pupils: Pupils are equal, round, and reactive to light.  Cardiovascular:     Rate  and Rhythm: Normal rate and regular rhythm.     Heart sounds: Normal heart sounds. No murmur heard.   No gallop.  Pulmonary:     Effort: Pulmonary effort is normal. No respiratory distress.     Breath sounds: Normal breath sounds. No wheezing or rales.  Skin:    General: Skin is warm and dry.  Neurological:     Mental Status: He is alert and oriented to person, place, and time.  Psychiatric:        Behavior: Behavior normal.    There were no vitals taken for this visit. Wt Readings from Last 3 Encounters:  03/16/21 280 lb (127 kg)  02/18/21 280 lb (127 kg)  02/16/21 280 lb (127 kg)    Diabetic Foot Exam - Simple   No data filed    Lab Results  Component Value Date   WBC 6.0 03/16/2021   HGB 13.4 03/16/2021   HCT 39.0 03/16/2021   PLT 243 03/16/2021   GLUCOSE 148 (H) 03/16/2021   CHOL 136 02/14/2021   TRIG 102.0 02/14/2021   HDL 55.50 02/14/2021   LDLDIRECT 124.0 05/02/2017   LDLCALC 60 02/14/2021   ALT 29 03/16/2021   AST 22 03/16/2021   NA 138 03/16/2021   K 3.8 03/16/2021   CL 105 03/16/2021   CREATININE 0.82 03/16/2021   BUN 21 (H) 03/16/2021   CO2 25 03/16/2021   TSH 2.57 02/14/2021   PSA 1.15 03/10/2020   INR 1.1 07/30/2013   HGBA1C 7.7 (H) 02/14/2021   MICROALBUR <0.7 05/02/2017    Lab Results  Component Value Date   TSH 2.57 02/14/2021   Lab Results  Component Value Date   WBC 6.0 03/16/2021   HGB 13.4 03/16/2021   HCT 39.0 03/16/2021   MCV 88.6 03/16/2021   PLT 243 03/16/2021   Lab Results  Component Value Date   NA 138 03/16/2021   K 3.8 03/16/2021   CO2 25 03/16/2021   GLUCOSE 148 (H) 03/16/2021   BUN 21 (H) 03/16/2021   CREATININE 0.82 03/16/2021   BILITOT 0.5 03/16/2021   ALKPHOS 76 03/16/2021   AST 22 03/16/2021   ALT 29 03/16/2021   PROT 6.8 03/16/2021   ALBUMIN 3.8 03/16/2021   CALCIUM 9.6 03/16/2021   ANIONGAP 8 03/16/2021    GFR 94.64 02/14/2021   Lab Results  Component Value Date   CHOL 136 02/14/2021   Lab Results  Component Value Date   HDL 55.50 02/14/2021   Lab Results  Component Value Date   LDLCALC 60 02/14/2021   Lab Results  Component Value Date   TRIG 102.0 02/14/2021   Lab Results  Component Value Date   CHOLHDL 2 02/14/2021   Lab Results  Component Value Date   HGBA1C 7.7 (H) 02/14/2021       Assessment & Plan:   Problem List Items Addressed This Visit   None    No orders of the defined types were placed in this encounter.   I, Dr. [name], DO, personally preformed the services described in this documentation.  All medical record entries made by the scribe were at my direction and in my presence.  I have reviewed the chart and discharge instructions (if applicable) and agree that the record reflects my personal performance and is accurate and complete. 03/16/2021   I,Tyler Brewer,acting as a scribe for [employer], DO.,have documented all relevant documentation on the behalf of [name], DO,as directed by  Kendrick Fries  R Lowne Chase, DO while in the presence of [employer], DO.   Tyler [employer]

## 2021-03-16 NOTE — ED Provider Notes (Signed)
[facility] Provider Note   CSN: [id number] Arrival date & time: 03/16/21  [id number]     History Chief Complaint  Patient presents with   Leg Pain    Tyler Brewer is a 60 y.o. male.  Patient with history of diabetic neuropathy followed by neurology, presents with exacerbation of his chronic symptoms.  He states that he is continue to fall at home.  Complaining of bilateral leg swelling.  Continues have foot drop on the left lower extremity.  Denies any new symptoms.  Denies any new headache or chest pain or abdominal pain.  No reports of fevers or cough or vomiting or diarrhea.      Past Medical History:  Diagnosis Date   Arthritis    CAD (coronary artery disease)  cardiac cath with moderate disease in a septal branch of the ramus intermedius 04/01/2012   Depression    Diabetes mellitus    poorly controlled by his report   History of narcotic addiction ([place])    past history of back pain   Hypercholesteremia    Hypertension    IBS (irritable bowel syndrome)    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[place])    Neuropathy    Obesity    Max weight was 390   OSA on CPAP    Panic attacks    Testosterone deficiency    Vertigo     Patient Active Problem List   Diagnosis Date Noted   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/12/2021   Diabetes mellitus ([place]) 01/03/2021   Type 2 diabetes mellitus with hyperglycemia, with long-term current use of insulin ([place]) 01/03/2021   Lower extremity edema 11/28/2020   Chronic low back pain 11/28/2020   Psoriatic arthritis ([place]) 11/28/2020   Thoracic aortic aneurysm without rupture ([place]) 11/28/2020   Bilateral leg weakness 11/05/2020   Acute left-sided low back pain with right-sided sciatica 07/19/2020   Hip pain, acute, left 07/05/2020   Tremor 07/05/2020   Weakness 07/05/2020   Balance problem 03/11/2020   Tinea cruris 03/11/2020   Cellulitis of left groin 03/11/2020   Acute pain of right  shoulder 03/11/2020   Interstitial lung disease ([place]) 05/02/2017   Pansinusitis 09/26/2016   Diabetic polyneuropathy associated with diabetes mellitus due to underlying condition ([place]) 05/08/2016   Methamphetamine use disorder, severe, dependence ([place]) 02/11/2016   Substance induced mood disorder ([place]) 02/11/2016   Exertional dyspnea 11/30/2015   ADD (attention deficit disorder) 09/29/2015   Binge eating 09/29/2015   Syncope 05/05/2012   Diarrhea 05/05/2012   Panic attacks 05/05/2012   OSA (obstructive sleep apnea) 05/05/2012   HTN (hypertension) 05/05/2012   Dyslipidemia 05/05/2012   CAD (coronary artery disease)  cardiac cath with moderate disease in a septal branch of the ramus intermedius 04/01/2012   Chest pain 04/01/2012   Drug abuse and dependence ([place]) 04/01/2012   Family history of coronary artery disease 03/31/2012   Sleep apnea, on C-pap 03/31/2012   Hyperlipemia 03/30/2012   HTN (hypertension), benign 03/30/2012   Morbid obesity ([place]) 03/30/2012   DM type 2, uncontrolled, with neuropathy ([place]) 03/30/2012   Depression with suicidal ideation 03/30/2012    Past Surgical History:  Procedure Laterality Date   CARDIAC CATHETERIZATION     IR FLUORO GUIDE CV LINE RIGHT  11/10/2020   IR US GUIDE VASC ACCESS RIGHT  11/10/2020   LEFT HEART CATHETERIZATION WITH CORONARY ANGIOGRAM N/A 03/31/2012   Procedure: LEFT HEART CATHETERIZATION WITH CORONARY ANGIOGRAM;  Surgeon: Leonie Man, MD;  Location: [facility]  CATH LAB;  Service: Cardiovascular;  Laterality: N/A;       Family History  Problem Relation Age of Onset   Diabetes type II Father    Hypertension Father    Pancreatic disease Father 18       Deceased   Healthy Mother    Healthy Sister    Healthy Son    Healthy Daughter    Parkinson's disease Maternal Grandmother     Social History   Tobacco Use   Smoking status: Every Day    Packs/day: 0.50    Types: Cigarettes    Last attempt to quit: 09/29/2020    Years since  quitting: 0.4   Smokeless tobacco: Never  Vaping Use   Vaping Use: Never used  Substance Use Topics   Alcohol use: No   Drug use: Not Currently    Types: Methamphetamines    Home Medications Prior to Admission medications   Medication Sig Start Date End Date Taking? Authorizing Provider  acetaminophen (TYLENOL) 500 MG tablet Take 500 mg by mouth every 6 (six) hours as needed. States he is taking one to two tablets a day    [provider]  amLODipine (NORVASC) 10 MG tablet Take 1 tablet (10 mg total) by mouth daily. 01/26/21   [name], DO  DULoxetine (CYMBALTA) 30 MG capsule Take 1 capsule (30 mg total) by mouth daily. Patient taking differently: Take 30 mg by mouth 3 (three) times a week. 07/19/20   [name] R, DO  furosemide (LASIX) 20 MG tablet TAKE 1 TABLET(20 MG) BY MOUTH DAILY 12/21/20   Carollee Herter, Alferd Apa, DO  gabapentin (NEURONTIN) 300 MG capsule Take 600 mg by mouth 3 (three) times daily. 02/16/21   [provider]  hydrALAZINE (APRESOLINE) 50 MG tablet Take 1 tablet (50 mg total) by mouth every 8 (eight) hours. 01/26/21   [name], DO  ibuprofen (ADVIL) 200 MG tablet Take 200 mg by mouth every 6 (six) hours as needed.    [provider]  insulin aspart (NOVOLOG FLEXPEN) 100 UNIT/ML FlexPen Inject 12 Units into the skin 3 (three) times daily with meals. 01/03/21   [name], Melanie Crazier, MD  insulin degludec (TRESIBA FLEXTOUCH) 200 UNIT/ML FlexTouch Pen Inject 46 Units into the skin daily. 01/03/21   [name], Melanie Crazier, MD  Insulin Pen Needle 31G X 5 MM MISC 1 Device by Does not apply route in the morning, at noon, in the evening, and at bedtime. 01/03/21   [name], Melanie Crazier, MD  losartan (COZAAR) 100 MG tablet Take 1 tablet (100 mg total) by mouth daily. 01/26/21   [name], DO  meclizine (ANTIVERT) 25 MG tablet Take 1 tablet (25 mg total) by mouth 3 (three) times daily as needed for  dizziness. 02/18/21   Molpus, John, MD  meloxicam (MOBIC) 15 MG tablet Take 1 tablet (15 mg total) by mouth daily. 03/14/21   Kirsteins, Luanna Salk, MD  Menthol, Topical Analgesic, (ICY HOT EX) Apply topically as needed.    [provider]  metaxalone (SKELAXIN) 400 MG tablet Take 1 tablet (400 mg total) by mouth 3 (three) times daily as needed. 03/02/21   [name], DO  metFORMIN (GLUCOPHAGE-XR) 500 MG 24 hr tablet Take 2 tablets (1,000 mg total) by mouth in the morning and at bedtime. 01/26/21   [name], DO  oxyCODONE (ROXICODONE) 5 MG immediate release tablet Take 1 tablet (5 mg total) by  mouth every 6 (six) hours as needed for up to 20 doses for severe pain. 02/03/21   [name], DO  pravastatin (PRAVACHOL) 40 MG tablet Take 1 tablet (40 mg total) by mouth daily. 01/26/21   [name], DO  pregabalin (LYRICA) 75 MG capsule Take 1 capsule (75 mg total) by mouth 2 (two) times daily. 03/14/21   Kirsteins, Luanna Salk, MD  tamsulosin (FLOMAX) 0.4 MG CAPS capsule Take 1 capsule (0.4 mg total) by mouth daily. 01/26/21   [name], DO  traMADol (ULTRAM) 50 MG tablet 2 po q6 h prn 02/03/21   [name], DO    Allergies    Patient has no known allergies.  Review of Systems   Review of Systems  Constitutional:  Negative for fever.  HENT:  Negative for ear pain and sore throat.   Eyes:  Negative for pain.  Respiratory:  Negative for cough.   Cardiovascular:  Negative for chest pain.  Gastrointestinal:  Negative for abdominal pain.  Genitourinary:  Negative for flank pain.  Musculoskeletal:  Negative for back pain.  Skin:  Negative for color change and rash.  Neurological:  Negative for syncope.  All other systems reviewed and are negative.  Physical Exam Updated Vital Signs BP (!) 176/97   Pulse 86   Temp 98 F (36.7 C) (Oral)   Resp 18   Ht '5\' 11"'$  (1.803 m)   Wt 127 kg   SpO2 99%   BMI 39.05 kg/m   Physical  Exam Constitutional:      Appearance: He is well-developed.  HENT:     Head: Normocephalic.     Nose: Nose normal.  Eyes:     Extraocular Movements: Extraocular movements intact.  Cardiovascular:     Rate and Rhythm: Normal rate.  Pulmonary:     Effort: Pulmonary effort is normal.  Skin:    Coloration: Skin is not jaundiced.  Neurological:     Mental Status: He is alert.     Comments: 5/5 strength bilateral upper extremities and right lower extremity.  Left lower extremity has a persistent foot drop which has been chronic for the patient over the last several months.    ED Results / Procedures / Treatments   Labs (all labs ordered are listed, but only abnormal results are displayed) Labs Reviewed  COMPREHENSIVE METABOLIC PANEL - Abnormal; Notable for the following components:      Result Value   Glucose, Bld 148 (*)    BUN 21 (*)    All other components within normal limits  CBC WITH DIFFERENTIAL/PLATELET  BRAIN NATRIURETIC PEPTIDE    EKG None  Radiology No results found.  Procedures Procedures   Medications Ordered in ED Medications - No data to display  ED Course  I have reviewed the triage vital signs and the nursing notes.  Pertinent labs & imaging results that were available during my care of the patient were reviewed by me and considered in my medical decision making (see chart for details).    MDM Rules/Calculators/A&P                           Normal hemoglobin normal.  Chemistry unremarkable as well.  No new focal neurodeficit noted.  Recommend patient to continue use his walker and wheelchair as needed at home.  Recommend continued follow-up with his neurologist and primary care doctor on outpatient basis.  Final Clinical Impression(s) /  ED Diagnoses Final diagnoses:  Neuropathy    Rx / DC Orders ED Discharge Orders     None        Luna Fuse, MD 03/16/21 [phone]

## 2021-03-16 NOTE — ED Triage Notes (Signed)
Pt arrives via [facility] c/o leg swelling and being unable to walk around at home. Reports fall on 7/24 at home due to pain.    EMS last VS - 142/90, HR 90, RR 16, 96% on RA, CBG 166

## 2021-03-17 ENCOUNTER — Encounter: Payer: Self-pay | Admitting: Family Medicine

## 2021-03-17 ENCOUNTER — Ambulatory Visit (INDEPENDENT_AMBULATORY_CARE_PROVIDER_SITE_OTHER): Payer: Medicare HMO

## 2021-03-17 DIAGNOSIS — IMO0002 Reserved for concepts with insufficient information to code with codable children: Secondary | ICD-10-CM

## 2021-03-17 DIAGNOSIS — E1165 Type 2 diabetes mellitus with hyperglycemia: Secondary | ICD-10-CM

## 2021-03-17 DIAGNOSIS — E1140 Type 2 diabetes mellitus with diabetic neuropathy, unspecified: Secondary | ICD-10-CM | POA: Diagnosis not present

## 2021-03-17 DIAGNOSIS — E785 Hyperlipidemia, unspecified: Secondary | ICD-10-CM | POA: Diagnosis not present

## 2021-03-17 NOTE — Chronic Care Management (AMB) (Signed)
Chronic Care Management   CCM RN Visit Note  03/17/2021 Name: Tyler Brewer MRN: [id number] DOB: [birth year]  Subjective: Tyler Brewer is a 60 y.o. year old male who is a primary care patient of [name], DO. The care management team was consulted for assistance with disease management and care coordination needs.    Engaged with patient by telephone for follow up visit in response to provider referral for case management and/or care coordination services.   Consent to Services:  The patient was given information about Chronic Care Management services, agreed to services, and gave verbal consent prior to initiation of services.  Please see initial visit note for detailed documentation.   Patient agreed to services and verbal consent obtained.   Assessment: Review of patient past medical history, allergies, medications, health status, including review of consultants reports, laboratory and other test data, was performed as part of comprehensive evaluation and provision of chronic care management services.   SDOH (Social Determinants of Health) assessments and interventions performed:    CCM Care Plan  No Known Allergies  Outpatient Encounter Medications as of 03/17/2021  Medication Sig Note   acetaminophen (TYLENOL) 500 MG tablet Take 500 mg by mouth every 6 (six) hours as needed. States he is taking one to two tablets a day    amLODipine (NORVASC) 10 MG tablet Take 1 tablet (10 mg total) by mouth daily.    furosemide (LASIX) 20 MG tablet TAKE 1 TABLET(20 MG) BY MOUTH DAILY 03/17/2021: Reports takes '20mg'$  two tablets every morning   gabapentin (NEURONTIN) 300 MG capsule Take 600 mg by mouth 3 (three) times daily.    hydrALAZINE (APRESOLINE) 50 MG tablet Take 1 tablet (50 mg total) by mouth every 8 (eight) hours.    ibuprofen (ADVIL) 200 MG tablet Take 200 mg by mouth every 6 (six) hours as needed.    insulin aspart (NOVOLOG FLEXPEN) 100 UNIT/ML FlexPen Inject 12 Units  into the skin 3 (three) times daily with meals. 03/17/2021: Reports takes 12 units tid with meals   insulin degludec (TRESIBA FLEXTOUCH) 200 UNIT/ML FlexTouch Pen Inject 46 Units into the skin daily.    losartan (COZAAR) 100 MG tablet Take 1 tablet (100 mg total) by mouth daily.    meclizine (ANTIVERT) 25 MG tablet Take 1 tablet (25 mg total) by mouth 3 (three) times daily as needed for dizziness.    meloxicam (MOBIC) 15 MG tablet Take 1 tablet (15 mg total) by mouth daily.    Menthol, Topical Analgesic, (ICY HOT EX) Apply topically as needed.    metaxalone (SKELAXIN) 400 MG tablet Take 1 tablet (400 mg total) by mouth 3 (three) times daily as needed.    metFORMIN (GLUCOPHAGE-XR) 500 MG 24 hr tablet Take 2 tablets (1,000 mg total) by mouth in the morning and at bedtime.    pravastatin (PRAVACHOL) 40 MG tablet Take 1 tablet (40 mg total) by mouth daily.    pregabalin (LYRICA) 75 MG capsule Take 1 capsule (75 mg total) by mouth 2 (two) times daily.    tamsulosin (FLOMAX) 0.4 MG CAPS capsule Take 1 capsule (0.4 mg total) by mouth daily.    traMADol (ULTRAM) 50 MG tablet 2 po q6 h prn 02/16/2021: Prescribed by [name], DO. LD 02/16/21   DULoxetine (CYMBALTA) 30 MG capsule Take 1 capsule (30 mg total) by mouth daily. (Patient not taking: Reported on 03/17/2021)    Insulin Pen Needle 31G X 5 MM MISC 1 Device by Does not apply  route in the morning, at noon, in the evening, and at bedtime.    oxyCODONE (ROXICODONE) 5 MG immediate release tablet Take 1 tablet (5 mg total) by mouth every 6 (six) hours as needed for up to 20 doses for severe pain. (Patient not taking: Reported on 03/17/2021) 02/16/2021: Prescribed by [name], DO, LD 02/15/2021   No facility-administered encounter medications on file as of 03/17/2021.    Patient Active Problem List   Diagnosis Date Noted   CIDP (chronic inflammatory demyelinating polyneuropathy) ([place]) 01/12/2021   Diabetes mellitus ([place]) 01/03/2021   Type 2  diabetes mellitus with hyperglycemia, with long-term current use of insulin ([place]) 01/03/2021   Lower extremity edema 11/28/2020   Chronic low back pain 11/28/2020   Psoriatic arthritis ([place]) 11/28/2020   Thoracic aortic aneurysm without rupture ([place]) 11/28/2020   Bilateral leg weakness 11/05/2020   Acute left-sided low back pain with right-sided sciatica 07/19/2020   Hip pain, acute, left 07/05/2020   Tremor 07/05/2020   Weakness 07/05/2020   Balance problem 03/11/2020   Tinea cruris 03/11/2020   Cellulitis of left groin 03/11/2020   Acute pain of right shoulder 03/11/2020   Interstitial lung disease ([place]) 05/02/2017   Pansinusitis 09/26/2016   Diabetic polyneuropathy associated with diabetes mellitus due to underlying condition ([place]) 05/08/2016   Methamphetamine use disorder, severe, dependence ([place]) 02/11/2016   Substance induced mood disorder ([place]) 02/11/2016   Exertional dyspnea 11/30/2015   ADD (attention deficit disorder) 09/29/2015   Binge eating 09/29/2015   Syncope 05/05/2012   Diarrhea 05/05/2012   Panic attacks 05/05/2012   OSA (obstructive sleep apnea) 05/05/2012   HTN (hypertension) 05/05/2012   Dyslipidemia 05/05/2012   CAD (coronary artery disease)  cardiac cath with moderate disease in a septal branch of the ramus intermedius 04/01/2012   Chest pain 04/01/2012   Drug abuse and dependence ([place]) 04/01/2012   Family history of coronary artery disease 03/31/2012   Sleep apnea, on C-pap 03/31/2012   Hyperlipemia 03/30/2012   HTN (hypertension), benign 03/30/2012   Morbid obesity ([place]) 03/30/2012   DM type 2, uncontrolled, with neuropathy ([place]) 03/30/2012   Depression with suicidal ideation 03/30/2012    Conditions to be addressed/monitored:HLD and DMII  Care Plan : Quality of Life  Updates made by Luretha Rued, RN since 03/17/2021 12:00 AM     Problem: Quality of Life   Priority: High     Long-Range Goal: Quality of Life Maintained/Improved   Start  Date: 12/06/2020  Expected End Date: 06/08/2021  This Visit's Progress: On track  Recent Progress: On track  Priority: High  Note:   Current Barriers:  Knowledge Deficits related to long term care plan to improve/manage of quality of life. Client with history of chronic disease, uncontrolled DM. Reports continued leg weakness and falls with chronic pain. Second opinion appointment with neurologist [facility] and a third opinion scheduled for 05/2021. He reports appointment for a nerve block is scheduled for 03/21/21. States he is interested in long term care facility placement. He also states he is open to skilled facility for rehab if he is eligible. He states he is completing paper work from [employer] that his is getting a motorized chair from. He reports he already has a ramp. Meals and in-home assistance continue to be provided by his roommates. And he reports he has spoken with social services regarding Medicaid services and plans are in place for someone to come to his home next week regarding  the application process. Unable to independently stand for periods of time for house hold chores -support from friends.  Limited Transportation-reports supportive friends, but not always available to take client to appointments and for pharmacy refills. Nurse Case Manager Clinical Goal(s):  patient will verbalize understanding of plan for maintenance/improvement of quality of life. Work toward goal of "get back healthy enough to join [employer]" patient will attend all scheduled medical appointments. patient will work with CM clinical social worker to discuss level of care concerns. the patient will demonstrate ongoing self health care management ability as evidenced by attending provider visits as scheduled, taking medications as prescribed, contacting provider for new or worsening symptoms Interventions:  1:1 collaboration with Carollee Herter, Alferd Apa, DO regarding  development and update of comprehensive plan of care as evidenced by provider attestation and co-signature Inter-disciplinary care team collaboration (see longitudinal plan of care) Evaluation of current treatment plan related to patient's health conditions and patient's adherence to plan as established by provider. Reiterated fall prevention strategies; Encouraged client to continue exercises as re commended provided by previous home heath therapist.  Reviewed medications with patient and discussed importance of taking medications as prescribed. Reviewed upcoming provider appointments.  Discussed pain management strategies - encouraged client to attend provider visits and take medications as prescribed. Reiterated importance of adequate rest/sleep  Re-consulted clinic social worker regarding level of care issues. Patient is interested in permanent long term care. States if eligible is also open to skilled facility for rehabilitation. Discussed plans with patient for ongoing care management follow up and provided patient with direct contact information for care management team Patient Goals/Self-Care Activities -continue to take medications as prescribed and follow up with provider if condition is not relieved or worsens. -continue management of diabetes per your Endocrinologist recommendations -attend all provider appointment as scheduled. -try to get adequate rest/sleep each night -continue to use assistive devices as recommended and minimize fall risk(eliminate small throw rugs, keep home well lite, remove clutter from walkways), use hand rails where available, consider installing grab bars/rails in other places as needed. -complete forms for motorized chair.  -coordinate with clinic social worker regarding assistance with skilled care facility needs. -Contact RNCM if you have any questions or concerns or community resource needs. Follow Up Plan: The patient has been provided with contact  information for the care management team and has been advised to call with any health related questions or concerns.  The care management team will reach out to the patient again over the next 45 days.           Care Plan : Diabetes Type 2 in patient with history of Hyperlipidemia  Updates made by Luretha Rued, RN since 03/17/2021 12:00 AM     Problem: Disease Progression   Priority: Medium     Long-Range Goal: Disease Progression Prevented or Minimized   Start Date: 12/06/2020  Expected End Date: 06/08/2021  This Visit's Progress: On track  Recent Progress: On track  Priority: Medium  Note:   Objective:  Lab Results  Component Value Date   HGBA1C 7.7 (H) 02/14/2021   Lab Results  Component Value Date   CREATININE 0.89 11/28/2020   CREATININE 1.11 11/20/2020   CREATININE 0.86 11/19/2020  Current Barriers:  Knowledge Deficits related to long term care plan for management of diabetes. Reports checking blood sugar as recommended. States blood sugar is within range 75% of the time. States blood sugar this morning was 122.  Limited family support, but reports  he has supportive friends. Currently staying with friends. Limited mobility Unable to independently stand for activities such as cooking, cleaning. Transportation needs inconsistent-reports uses [facility] transportation for local appointments and states his uncle will be taking him to his appointment in [place] on 03/24/21. Case Manager Clinical Goal(s):  patient will demonstrate improved adherence to prescribed treatment plan for diabetes self care/management as evidenced by: daily monitoring and recording of CBG  adherence to prescribed medication regimen contacting provider for new or worsened symptoms or questions Interventions:  Collaboration with Carollee Herter, Alferd Apa, DO regarding development and update of comprehensive plan of care as evidenced by provider attestation and co-signature Inter-disciplinary care team  collaboration (see longitudinal plan of care) Reviewed medications with patient and encouraged to continue to work with clinic pharmacist regarding medications Reviewed scheduled/upcoming provider appointments. Re-consulted clinic social worker regarding level of care issues. Patient is interested in permanent long term care. States if eligible is also open to skilled facility for rehabilitation. Discussed plans with patient for ongoing care management follow up and provided patient with direct contact information for care management team Patient Goals: - continue to attend provider visit as scheduled - continue to check blood sugar as recommended by provider and call provider with questions concerns.  - continue to take medications as prescribed and work with clinic pharmacist regarding medications. - know blood sugar target range and when to call doctor - know target Hemoglobin A1C - take the blood sugar meter to all doctor visits Follow Up Plan: The patient has been provided with contact information for the care management team and has been advised to call with any health related questions or concerns.  The care management team will reach out to the patient again over the next 45 days.      Plan:Telephone follow up appointment with care management team member scheduled for:  next month and The patient has been provided with contact information for the care management team and has been advised to call with any health related questions or concerns.   Thea Silversmith, RN, MSN, BSN, CCM Care Management Coordinator [facility] [phone]

## 2021-03-17 NOTE — Patient Instructions (Signed)
Visit Information  PATIENT GOALS:  Goals Addressed               This Visit's Progress     COMPLETED: improve quality of life-get back healthy enough to join [employer] (pt-stated)        Timeframe:  Long-Range Goal Priority:  Medium Start Date:  12/06/20                           Expected End Date: 06/07/21                     Completed-goal changed   Why is this important?   Having a long-term illness can be scary.  It can also be stressful for you and your caregiver.  These steps may help.    Notes:       maintain and/or improve quality of life   On track     Timeframe:  Long-Range Goal Priority:  High Start Date: 03/17/21                            Expected End Date: 09/17/21  -continue to take medications as prescribed and follow up with provider if condition is not relieved or worsens. -continue management of diabetes per your Endocrinologist recommendations -attend all provider appointment as scheduled. -try to get adequate rest/sleep each night -continue to use assistive devices as recommended and minimize fall risk(eliminate small throw rugs, keep home well lite, remove clutter from walkways), use hand rails where available, consider installing grab bars/rails in other places as needed. -complete forms for motorized chair.  -coordinate with clinic social worker regarding assistance with skilled care facility needs.                             Monitor and Manage My Blood Sugar   On track     Timeframe:  Long-Range Goal Priority:  Medium Start Date:  12/06/20                           Expected End Date: 06/07/21                      Follow Up Date 04/17/21  - continue to attend provider visit as scheduled - continue to check blood sugar as recommended by provider and call provider with questions concerns.  - continue to take medications as prescribed and work with clinic pharmacist regarding medications. - know blood sugar target range and when to call  doctor - know target Hemoglobin A1C - take the blood sugar meter to all doctor visits    Why is this important?   Checking your blood sugar at home helps to keep it from getting very high or very low.  Writing the results in a diary or log helps the doctor know how to care for you.  Your blood sugar log should have the time, date and the results.  Also, write down the amount of insulin or other medicine that you take.  Other information, like what you ate, exercise done and how you were feeling, will also be helpful.     Notes:         Patient verbalizes understanding of instructions provided today and agrees to view in [place].   Telephone follow up appointment with care  management team member scheduled for: 04/18/21 The patient has been provided with contact information for the care management team and has been advised to call with any health related questions or concerns.   Thea Silversmith, RN, MSN, BSN, CCM Care Management Coordinator [facility] [phone]

## 2021-03-21 ENCOUNTER — Encounter: Payer: Self-pay | Admitting: Physical Medicine & Rehabilitation

## 2021-03-21 ENCOUNTER — Encounter: Payer: Medicare HMO | Attending: Physical Medicine & Rehabilitation | Admitting: Physical Medicine & Rehabilitation

## 2021-03-21 ENCOUNTER — Telehealth: Payer: Self-pay | Admitting: *Deleted

## 2021-03-21 ENCOUNTER — Other Ambulatory Visit: Payer: Self-pay

## 2021-03-21 VITALS — BP 114/72 | HR 100 | Temp 98.1°F | Ht 71.0 in | Wt 280.0 lb

## 2021-03-21 DIAGNOSIS — M47817 Spondylosis without myelopathy or radiculopathy, lumbosacral region: Secondary | ICD-10-CM | POA: Diagnosis not present

## 2021-03-21 DIAGNOSIS — Z794 Long term (current) use of insulin: Secondary | ICD-10-CM | POA: Insufficient documentation

## 2021-03-21 DIAGNOSIS — E1142 Type 2 diabetes mellitus with diabetic polyneuropathy: Secondary | ICD-10-CM | POA: Insufficient documentation

## 2021-03-21 NOTE — Progress Notes (Signed)
  PROCEDURE RECORD [facility]   Name: [name] DOB:1960/10/20 MRN: [id number]  Date:03/21/2021  Physician: Alysia Penna, MD    Nurse/CMA: Jorja Loa MA  Allergies: No Known Allergies  Consent Signed: Yes.    Is patient diabetic? Yes.    CBG today? 114  Pregnant: No. LMP: No LMP for male patient. (age 57-55)  Anticoagulants: no Anti-inflammatory: no Antibiotics: no  Procedure: Lumbar medial branch blocks  Position: Prone Start Time: 3:32 PM  End Time: 3:39 PM  Fluoro Time: 26  RN/CMA Nicolet Griffy MA Mayumi Summerson MA    Time 3:06 pm 6:47 pm    BP 114/72 102/67    Pulse 100 89    Respirations 16 16    O2 Sat 95 98    S/S 6 6    Pain Level 8/10 7/10     D/C home with [facility]BER, patient A & O X 3, D/C instructions reviewed, and sits independently.

## 2021-03-21 NOTE — Chronic Care Management (AMB) (Signed)
  Care Management   Note  03/21/2021 Name: Tyler Brewer MRN: [id number] DOB: 03/21/1961  Tyler Brewer is a 60 y.o. year old male who is a primary care patient of [name], DO and is actively engaged with the care management team. I reached out to Thomasenia Bottoms by phone today to assist with scheduling an initial visit with the Licensed Clinical Social Worker  Follow up plan: Unsuccessful telephone outreach attempt made. A HIPAA compliant phone message was left for the patient providing contact information and requesting a return call.  The care management team will reach out to the patient again over the next 7 days.  If patient returns call to provider office, please advise to call [place] [facility] [place] [facility]  Direct Dial: [phone]

## 2021-03-21 NOTE — Progress Notes (Signed)
Left Lumbar L3, L4  medial branch blocks and L 5 dorsal ramus injection under fluoroscopic guidance   Indication: Left Lumbar pain which is not relieved by medication management or other conservative care and interfering with self-care and mobility.  Informed consent was obtained after describing risks and benefits of the procedure with the patient, this includes bleeding, bruising, infection, paralysis and medication side effects.  The patient wishes to proceed and has given written consent.  The patient was placed in a prone position.  The lumbar area was marked and prepped with Betadine.  One mL of 1% lidocaine was injected into each of 3 areas into the skin and subcutaneous tissue.  Then a 22-gauge 5in spinal needle was inserted targeting the junction of the left S1 superior articular process and sacral ala junction.  Needle was advanced under fluoroscopic guidance.  Bone contact was made. Isovue 200 was injected x 0.5 mL demonstrating no intravascular uptake.  Then a solution containing  2% MPF lidocaine was injected x 0.5 mL.  Then the left L5 superior articular process in transverse process junction was targeted.  Bone contact was made. Isovue 200 was injected x 0.5 mL demonstrating no intravascular uptake.  Then a solution containing 2% MPF lidocaine was injected x 0.5 mL.  Then the left L4 superior articular process in transverse process junction was targeted.  Bone contact was made.  Isovue 200 was injected x 0.5 mL demonstrating no intravascular uptake.  Then a solution containing  2% MPF lidocaine was injected x 0.5 mL.  Patient tolerated procedure well.  Post procedure instructions were given.

## 2021-03-21 NOTE — Chronic Care Management (AMB) (Signed)
  Care Management   Note  03/21/2021 Name: Tyler Brewer MRN: [id number] DOB: 1961/03/11  [name] is a 60 y.o. year old male who is a primary care patient of [name], DO and is actively engaged with the care management team. I reached out to Thomasenia Bottoms by phone today to assist with scheduling an initial visit with the Licensed Clinical Social Worker  Follow up plan: Telephone appointment with care management team member scheduled for:03/30/2021  [name]  Care Guide, Embedded Care Coordination [place]  Care Management  Direct Dial: [phone]

## 2021-03-23 ENCOUNTER — Telehealth: Payer: Medicare HMO

## 2021-03-24 DIAGNOSIS — E1140 Type 2 diabetes mellitus with diabetic neuropathy, unspecified: Secondary | ICD-10-CM | POA: Diagnosis not present

## 2021-03-24 DIAGNOSIS — R29898 Other symptoms and signs involving the musculoskeletal system: Secondary | ICD-10-CM | POA: Diagnosis not present

## 2021-03-24 DIAGNOSIS — Z794 Long term (current) use of insulin: Secondary | ICD-10-CM | POA: Diagnosis not present

## 2021-03-24 DIAGNOSIS — G629 Polyneuropathy, unspecified: Secondary | ICD-10-CM | POA: Diagnosis not present

## 2021-03-24 DIAGNOSIS — Z7984 Long term (current) use of oral hypoglycemic drugs: Secondary | ICD-10-CM | POA: Diagnosis not present

## 2021-03-27 ENCOUNTER — Ambulatory Visit: Payer: Medicare HMO | Admitting: Dietician

## 2021-03-27 ENCOUNTER — Encounter: Payer: Self-pay | Admitting: Neurology

## 2021-03-27 ENCOUNTER — Ambulatory Visit (INDEPENDENT_AMBULATORY_CARE_PROVIDER_SITE_OTHER): Payer: Medicare HMO | Admitting: Neurology

## 2021-03-27 ENCOUNTER — Other Ambulatory Visit: Payer: Self-pay

## 2021-03-27 DIAGNOSIS — G610 Guillain-Barre syndrome: Secondary | ICD-10-CM | POA: Diagnosis not present

## 2021-03-27 DIAGNOSIS — Z794 Long term (current) use of insulin: Secondary | ICD-10-CM | POA: Diagnosis not present

## 2021-03-27 DIAGNOSIS — E1142 Type 2 diabetes mellitus with diabetic polyneuropathy: Secondary | ICD-10-CM | POA: Diagnosis not present

## 2021-03-27 DIAGNOSIS — M47817 Spondylosis without myelopathy or radiculopathy, lumbosacral region: Secondary | ICD-10-CM | POA: Diagnosis not present

## 2021-03-27 DIAGNOSIS — E1165 Type 2 diabetes mellitus with hyperglycemia: Secondary | ICD-10-CM

## 2021-03-27 NOTE — Progress Notes (Signed)
Follow-up Visit   Date: 03/27/21   Tyler Brewer MRN: [id number] DOB: [birth year]   Interim History: Tyler Brewer is a 60 y.o. 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 The patient was accompanied to the clinic by mother.  He requested sooner visit because of worsening leg weakness over the past few weeks.  He is no longer able to raise his legs up off his chair and having increased difficulty with transfers and climbing a step to enter his home.  He has fallen several times and called EMS to help him up.  He walks with a walker at home.  He continues to be compliant with lifestyle changes and diabetes is doing much better, with HbA1c down to 6.9 last week.  He was evaluated for second opinion at [facility] by Dr. Celine Ahr who ordered neuropathy labs and recommended repeat NCS/EMG.  He is awaiting appointment for EMG.   Medications:  Current Outpatient Medications on File Prior to Visit  Medication Sig Dispense Refill   acetaminophen (TYLENOL) 500 MG tablet Take 500 mg by mouth every 6 (six) hours as needed. States he is taking one to two tablets a day     amLODipine (NORVASC) 10 MG tablet Take 1 tablet (10 mg total) by mouth daily. 90 tablet 1   COMFORT EZ INSULIN SYRINGE 31G X 5/16" 1 ML MISC      furosemide (LASIX) 20 MG tablet TAKE 1 TABLET(20 MG) BY MOUTH DAILY 90 tablet 0   hydrALAZINE (APRESOLINE) 50 MG tablet Take 1 tablet (50 mg total) by mouth every 8 (eight) hours. 90 tablet 0   ibuprofen (ADVIL) 200 MG tablet Take 200 mg by mouth every 6 (six) hours as needed.     insulin aspart (NOVOLOG FLEXPEN) 100 UNIT/ML FlexPen Inject 12 Units into the skin 3 (three) times daily with meals. 30 mL 2   insulin degludec (TRESIBA FLEXTOUCH) 200 UNIT/ML FlexTouch Pen Inject 46 Units into the skin daily. 30 mL 2   Insulin Pen Needle 31G X 5 MM MISC 1 Device by Does not  apply route in the morning, at noon, in the evening, and at bedtime. 400 each 2   losartan (COZAAR) 100 MG tablet Take 1 tablet (100 mg total) by mouth daily. 90 tablet 3   meclizine (ANTIVERT) 25 MG tablet Take 1 tablet (25 mg total) by mouth 3 (three) times daily as needed for dizziness. 30 tablet 0   meloxicam (MOBIC) 15 MG tablet Take 1 tablet (15 mg total) by mouth daily. 30 tablet 1   Menthol, Topical Analgesic, (ICY HOT EX) Apply topically as needed.     metaxalone (SKELAXIN) 400 MG tablet Take 1 tablet (400 mg total) by mouth 3 (three) times daily as needed. 30 tablet 0   metFORMIN (GLUCOPHAGE-XR) 500 MG 24 hr tablet Take 2 tablets (1,000 mg total) by mouth in the morning and at bedtime. 360 tablet 1   pravastatin (PRAVACHOL) 40 MG tablet Take 1 tablet (40 mg total) by mouth daily. 90 tablet 1   pregabalin (LYRICA) 75 MG capsule Take 1 capsule (75 mg total) by mouth 2 (two) times daily. 60 capsule 1   tamsulosin (FLOMAX) 0.4 MG CAPS capsule Take 1 capsule (0.4 mg total) by mouth daily. 90 capsule 3   traMADol (ULTRAM) 50 MG tablet 2 po q6 h prn 120 tablet 1   DULoxetine (CYMBALTA) 30 MG capsule Take 1 capsule (30 mg total)  by mouth daily. (Patient not taking: Reported on 03/27/2021) 60 capsule 3   furosemide (LASIX) 40 MG tablet Take by mouth. (Patient not taking: Reported on 03/27/2021)     gabapentin (NEURONTIN) 300 MG capsule Take 600 mg by mouth 3 (three) times daily.     No current facility-administered medications on file prior to visit.    Allergies: No Known Allergies  Neurological Exam: MENTAL STATUS including orientation to time, place, person, recent and remote memory, attention span and concentration, language, and fund of knowledge is normal.  Speech is not dysarthric.  CRANIAL NERVES:  No visual field defects.  Pupils equal round and reactive to light.  Normal conjugate, extra-ocular eye movements in all directions of gaze.  No ptosis.  Face is symmetric.  MOTOR:  No  atrophy, fasciculations or abnormal movements.  No pronator drift.   Upper Extremity:  Right  Left  Deltoid  5/5   5/5   Biceps  5/5   5/5   Triceps  5/5   5/5   Infraspinatus 5/5  5/5  Medial pectoralis 5/5  5/5  Wrist extensors  5/5   5/5   Wrist flexors  5/5   5/5   Finger extensors  5/5   5/5   Finger flexors  5/5   5/5   Dorsal interossei  5/5   5/5   Abductor pollicis  5/5   5/5   Tone (Ashworth scale)  0  0   Lower Extremity:  Right  Left  Hip flexors  2/5  2/5  Adductor  3/5  3/5  Abductor  5/5  5/5  Knee flexors  4/5   4/5   Knee extensors  4/5   4-/5   Dorsiflexors  4/5   2/5   Plantarflexors  4/5   2/5   Toe extensors  4/5   2/5   Toe flexors  4/5   2/5   Tone (Ashworth scale)  0  0    MSRs:                                           Right        Left brachioradialis 2+  2+  biceps 2+  2+  triceps 1+  1+  patellar 0  0  ankle jerk 0  0  plantar response down  down   SENSORY:  Intact to vibration at the right knee and hands, absent vibration at the left knee and bilateral ankles.   COORDINATION/GAIT:  Gait not tested, patient arrived in wheelchair   Data: CSF 11/09/2020:  R3  W3  P124* G95*  IgG index 0.6, OCB neg, VDRL neg, cytology neg  Lab Results  Component Value Date   HGBA1C 7.7 (H) 02/14/2021  Labs 03/24/2021:  HbA1c 6.9*  CT head and cervical spine 01/03/2021: 1. No evidence of acute intracranial process. 2. No evidence of acute fracture or traumatic listhesis of the cervical spine. 3. Stable burden of mild chronic microvascular ischemic disease and age related global parenchymal volume loss. 4. Mild lower cervical spondylosis.   MRI cervical spine wwo contrast 11/06/2020:   1. Small disc herniations at C3-C4 and C4-C5 result in up to mild spinal stenosis with no significant cord mass effect. No abnormal cord signal. 2. Up to moderate degenerative neural foraminal stenosis at the left C4 and left C7 nerve levels. Mild for age  cervical  spine degeneration otherwise. 3. Nonspecific T2 and STIR hyperintensity in the pons. Most commonly this would be due to chronic small vessel disease. Normal appearing cervical cord  MRI thoracic spine wwo contrast 11/06/2020: Normal appearing thoracic cord. No finding to explain the patient's symptoms. No change in small right paracentral protrusion at T4-5 without stenosis.  MRI lumbar spine wwo contrast 11/05/2017: 1. Mild nonspecific inflammation in the right erector spinae muscle at L2-L3, and on the left at S1-S2. These might be posttraumatic in this setting.   2. No other acute or inflammatory process identified in the lumbar spine.   3. Mild lumbar spine degeneration without spinal stenosis. Rightward L2-L3 disc protrusion with annular fissure does appear mildly increased since December. Query right L3 radiculitis. Moderately advanced chronic facet degeneration on the left at L5-S1.    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  Today, his motor strength shows weaker hip flexors (2/5), previously 4/5.  This is in the setting of better diabetes management, last HbA1c 6.9.  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  With his new weakness, differential dx includes overlapping diabetic amyotrophy vs worsening polyradiculoneropathy.  If this is the latter, than immune-mediated process is considered moreso over diabetes, since his blood sugars are much better than when symptoms started in April.  I agree with Dr. Dwaine Gale recommendation for repeat NCS/EMG to help guide management.  He is waiting to be scheduled for this at [facility].   Total time spent reviewing records, interview, history/exam, documentation, counseling, and coordination of care on day of encounter:  30 min    Thank you for allowing me to participate in patient's care.  If I can answer any  additional questions, I would be pleased to do so.    Sincerely,    Severn Goddard K. Posey Pronto, DO

## 2021-03-27 NOTE — Progress Notes (Signed)
This visit was completed via telephone.   I spoke with Tyler Brewer.  He was at his home and I was in my office.      History includes Type 2 diabetes, CIDP, constipation A1C has decreased from 9.9% 01/03/2021 to 6.8% recently Medications/Supplements include:   Tresiba 46 units q am, Novolog 12 units before each meal, Metformin XR, vitamin B-12, Vitamin B-complex, biotin, MVI  Weight about 280 lbs per patient.  Patient lives with a roommate and 2 friends who help care for him.  They do the shopping and cooking and other chores.  Patient can bath and use the bathroom by himself. He is on disability. He has a degree in PE and health education and worked in [employer] prior.  Diet hx: Breakfast:  2 eggs, bacon, biscuit OR Raisin Bran and 2% milk Lunch:  Sandwich Dinner:  Meat and vegetable OR spaghetti with ground [place] Snacks:  sugar free ice cream, sugar free popsickles  Beverages:  water, crystal light, rare unsweetened tea  Medical Nutrition Therapy  Length of appointment 30 minutes.  Patient was not feeling well and visit was shortened.    Primary concerns today: Patient stated that he feels comfortable with diet for diabetes.  He is now using the [employer] and being more aware of his blood sugar has helped him reduce his A1C  from 9.9% to 6.8%. He states that he has occasional low blood sugar readings. He reports having an endocrinologist appointment in 2 weeks. He would like to learn more about nutrition for CIDP.  Referral diagnosis: Type 2 Diabetes Preferred learning style: no preference indicated Learning readiness: change in progress   NUTRITION DIAGNOSIS  NB-1.1 Food and nutrition-related knowledge deficit As related to nutrition for autoimmune.  As evidenced by patient report.   NUTRITION INTERVENTION  Nutrition education (E-1) on the following topics:  Benefits of a diet that is minimally processed and high in vegetables Sample meal options Avoiding foods  high in saturated fat resources  Handouts Provided Include  Mediterranean diet Diet and Diabetes:  Recipe for success Plant based resources  Learning Style & Readiness for Change Teaching method utilized: Visual & Auditory  Demonstrated degree of understanding via: Teach Back  Barriers to learning/adherence to lifestyle change: physical limitation  Goals  Choose foods that are minimally processed. Increase vegetable intake (particularly non-starchy). Consider more plant based recipe options.  Plant based protein options include:  beans, lentils, tofu, nuts, seeds Avoid foods high in saturated fat See handouts related to recipe and meal ideas. Consider checking your vitamin D level   MONITORING & EVALUATION Dietary intake, weekly physical activity, and nutrition prn.  Next Steps  Patient is to call or my chart for questions.[name] Kitchen

## 2021-03-27 NOTE — Patient Instructions (Addendum)
Choose foods that are minimally processed. Increase vegetable intake (particularly non-starchy). Consider more plant based recipe options.    Plant based protein options include:  beans, lentils, tofu, nuts, seeds Avoid foods high in saturated fat  See handouts related to recipe and meal ideas.  Consider checking your vitamin D level

## 2021-03-28 ENCOUNTER — Telehealth: Payer: Self-pay | Admitting: Family Medicine

## 2021-03-28 NOTE — Telephone Encounter (Signed)
[facility] with [facility] calling for an extention on PT 1 time a week for 8 weeks. Okay to leave

## 2021-03-28 NOTE — Telephone Encounter (Signed)
Verbal orders given  

## 2021-03-28 NOTE — ED Provider Notes (Signed)
[facility] EMERGENCY DEPARTMENT Provider Note   CSN: [id number] Arrival date & time: 01/27/21  [id number]     History Chief Complaint  Patient presents with   Hip Pain    right    Tyler Brewer is a 60 y.o. male.  60 y.o male with an insignificant medical history including CAD, DM, hypertension presents to the ED via EMS with a chief complaint of right hip pain, right second toe pain status post falling in the tub 2 days ago.  Patient reports he had multiple falls in the last couple weeks, this is a recurrent complaint for him.  He is currently under pain management, due to history of chronic pain.  He did not check his head, no loss of consciousness, currently not on any blood thinners.  No loss of consciousness, no headache.  No other complaints.  The history is provided by the patient and medical records.  Hip Pain This is a recurrent problem. The current episode started more than 2 days ago. Pertinent negatives include no chest pain, no abdominal pain and no headaches.      Past Medical History:  Diagnosis Date   Arthritis    CAD (coronary artery disease)  cardiac cath with moderate disease in a septal branch of the ramus intermedius 04/01/2012   Depression    Diabetes mellitus    poorly controlled by his report   History of narcotic addiction ([place])    past history of back pain   Hypercholesteremia    Hypertension    IBS (irritable bowel syndrome)    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[place])    Neuropathy    Obesity    Max weight was 390   OSA on CPAP    Panic attacks    Testosterone deficiency    Vertigo     Patient Active Problem List   Diagnosis Date Noted   CIDP (chronic inflammatory demyelinating polyneuropathy) ([place]) 01/12/2021   Diabetes mellitus ([place]) 01/03/2021   Type 2 diabetes mellitus with hyperglycemia, with long-term current use of insulin ([place]) 01/03/2021   Lower extremity edema 11/28/2020   Chronic low back pain 11/28/2020   Psoriatic arthritis  ([place]) 11/28/2020   Thoracic aortic aneurysm without rupture ([place]) 11/28/2020   Bilateral leg weakness 11/05/2020   Acute left-sided low back pain with right-sided sciatica 07/19/2020   Hip pain, acute, left 07/05/2020   Tremor 07/05/2020   Weakness 07/05/2020   Balance problem 03/11/2020   Tinea cruris 03/11/2020   Cellulitis of left groin 03/11/2020   Acute pain of right shoulder 03/11/2020   Interstitial lung disease ([place]) 05/02/2017   Pansinusitis 09/26/2016   Diabetic polyneuropathy associated with diabetes mellitus due to underlying condition ([place]) 05/08/2016   Methamphetamine use disorder, severe, dependence ([place]) 02/11/2016   Substance induced mood disorder ([place]) 02/11/2016   Exertional dyspnea 11/30/2015   ADD (attention deficit disorder) 09/29/2015   Binge eating 09/29/2015   Syncope 05/05/2012   Diarrhea 05/05/2012   Panic attacks 05/05/2012   OSA (obstructive sleep apnea) 05/05/2012   HTN (hypertension) 05/05/2012   Dyslipidemia 05/05/2012   CAD (coronary artery disease)  cardiac cath with moderate disease in a septal branch of the ramus intermedius 04/01/2012   Chest pain 04/01/2012   Drug abuse and dependence ([place]) 04/01/2012   Family history of coronary artery disease 03/31/2012   Sleep apnea, on C-pap 03/31/2012   Hyperlipemia 03/30/2012   HTN (hypertension), benign 03/30/2012   Morbid obesity ([place]) 03/30/2012   DM type 2, uncontrolled, with neuropathy (  [place]) 03/30/2012   Depression with suicidal ideation 03/30/2012    Past Surgical History:  Procedure Laterality Date   CARDIAC CATHETERIZATION     IR FLUORO GUIDE CV LINE RIGHT  11/10/2020   IR US GUIDE VASC ACCESS RIGHT  11/10/2020   LEFT HEART CATHETERIZATION WITH CORONARY ANGIOGRAM N/A 03/31/2012   Procedure: LEFT HEART CATHETERIZATION WITH CORONARY ANGIOGRAM;  Surgeon: Leonie Man, MD;  Location[facility] CATH LAB;  Service: Cardiovascular;  Laterality: N/A;       Family History  Problem Relation Age of  Onset   Diabetes type II Father    Hypertension Father    Pancreatic disease Father 75       Deceased   Healthy Mother    Healthy Sister    Healthy Son    Healthy Daughter    Parkinson's disease Maternal Grandmother     Social History   Tobacco Use   Smoking status: Every Day    Packs/day: 0.50    Types: Cigarettes    Last attempt to quit: 09/29/2020    Years since quitting: 0.4   Smokeless tobacco: Never  Vaping Use   Vaping Use: Never used  Substance Use Topics   Alcohol use: No   Drug use: Not Currently    Types: Methamphetamines    Home Medications Prior to Admission medications   Medication Sig Start Date End Date Taking? Authorizing Provider  acetaminophen (TYLENOL) 500 MG tablet Take 500 mg by mouth every 6 (six) hours as needed. States he is taking one to two tablets a day    [provider]  amLODipine (NORVASC) 10 MG tablet Take 1 tablet (10 mg total) by mouth daily. 01/26/21   [name], DO  COMFORT EZ INSULIN SYRINGE 31G X 5/16" 1 ML MISC  12/15/20   [provider]  DULoxetine (CYMBALTA) 30 MG capsule Take 1 capsule (30 mg total) by mouth daily. Patient not taking: Reported on 03/27/2021 07/19/20   Carollee Herter, Kendrick Fries R, DO  furosemide (LASIX) 20 MG tablet TAKE 1 TABLET(20 MG) BY MOUTH DAILY 12/21/20   Carollee Herter, Alferd Apa, DO  furosemide (LASIX) 40 MG tablet Take by mouth. Patient not taking: Reported on 03/27/2021 02/08/21   [provider]  gabapentin (NEURONTIN) 300 MG capsule Take 600 mg by mouth 3 (three) times daily. 02/16/21   [provider]  hydrALAZINE (APRESOLINE) 50 MG tablet Take 1 tablet (50 mg total) by mouth every 8 (eight) hours. 01/26/21   [name], DO  ibuprofen (ADVIL) 200 MG tablet Take 200 mg by mouth every 6 (six) hours as needed.    [provider]  insulin aspart (NOVOLOG FLEXPEN) 100 UNIT/ML FlexPen Inject 12 Units into the skin 3 (three) times daily with meals. 01/03/21    [name], Melanie Crazier, MD  insulin degludec (TRESIBA FLEXTOUCH) 200 UNIT/ML FlexTouch Pen Inject 46 Units into the skin daily. 01/03/21   [name], Melanie Crazier, MD  Insulin Pen Needle 31G X 5 MM MISC 1 Device by Does not apply route in the morning, at noon, in the evening, and at bedtime. 01/03/21   [name], Melanie Crazier, MD  losartan (COZAAR) 100 MG tablet Take 1 tablet (100 mg total) by mouth daily. 01/26/21   [name], DO  meclizine (ANTIVERT) 25 MG tablet Take 1 tablet (25 mg total) by mouth 3 (three) times daily as needed for dizziness. 02/18/21   Molpus, John, MD  meloxicam (MOBIC) 15 MG tablet Take  1 tablet (15 mg total) by mouth daily. 03/14/21   Kirsteins, Luanna Salk, MD  Menthol, Topical Analgesic, (ICY HOT EX) Apply topically as needed.    [provider]  metaxalone (SKELAXIN) 400 MG tablet Take 1 tablet (400 mg total) by mouth 3 (three) times daily as needed. 03/02/21   [name], DO  metFORMIN (GLUCOPHAGE-XR) 500 MG 24 hr tablet Take 2 tablets (1,000 mg total) by mouth in the morning and at bedtime. 01/26/21   [name], DO  pravastatin (PRAVACHOL) 40 MG tablet Take 1 tablet (40 mg total) by mouth daily. 01/26/21   [name], DO  pregabalin (LYRICA) 75 MG capsule Take 1 capsule (75 mg total) by mouth 2 (two) times daily. 03/14/21   Kirsteins, Luanna Salk, MD  tamsulosin (FLOMAX) 0.4 MG CAPS capsule Take 1 capsule (0.4 mg total) by mouth daily. 01/26/21   [name], DO  traMADol (ULTRAM) 50 MG tablet 2 po q6 h prn 02/03/21   [name], DO    Allergies    Patient has no known allergies.  Review of Systems   Review of Systems  Constitutional:  Negative for chills and fever.  Cardiovascular:  Negative for chest pain.  Gastrointestinal:  Negative for abdominal pain.  Musculoskeletal:  Positive for arthralgias.  Neurological:  Negative for light-headedness and headaches.  All other systems reviewed and are  negative.  Physical Exam Updated Vital Signs BP 100/67   Pulse 85   Temp 98.1 F (36.7 C) (Oral)   Resp 18   SpO2 100%   Physical Exam Vitals and nursing note reviewed.  Constitutional:      Appearance: Normal appearance.     Comments: Appears uncomfortable in pain lying at the lateral left decubitus.  HENT:     Head: Normocephalic and atraumatic.     Mouth/Throat:     Mouth: Mucous membranes are moist.  Eyes:     Pupils: Pupils are equal, round, and reactive to light.  Cardiovascular:     Rate and Rhythm: Normal rate.  Pulmonary:     Effort: Pulmonary effort is normal.  Abdominal:     General: Abdomen is flat.  Musculoskeletal:        General: Tenderness present.     Cervical back: Normal range of motion and neck supple.     Comments: To palpation to the right hand, has range of motion but limited due to pain.  Foot appears to be bruised, however without any laceration noted.  Skin:    General: Skin is warm and dry.  Neurological:     Mental Status: He is alert and oriented to person, place, and time.    ED Results / Procedures / Treatments   Labs (all labs ordered are listed, but only abnormal results are displayed) Labs Reviewed - No data to display  EKG None  Radiology No results found.  Procedures Procedures   Medications Ordered in ED Medications  HYDROcodone-acetaminophen (NORCO/VICODIN) 5-325 MG per tablet 1 tablet (1 tablet Oral Given 01/27/21 1134)  ketorolac (TORADOL) 15 MG/ML injection 15 mg (15 mg Intramuscular Given 01/27/21 1134)    ED Course  I have reviewed the triage vital signs and the nursing notes.  Pertinent labs & imaging results that were available during my care of the patient were reviewed by me and considered in my medical decision making (see chart for details).    MDM Rules/Calculators/A&P  Patient with extensive past medical  history presents to the ED for a new fall along with right hip pain via EMS.  This is a recurrent  complaint for patient, has multiple falls, he is currently under chronic pain management.  Has a pain management agreement that was filed in the month of June.  During my evaluation he appears uncomfortable, complains of pain with palpation of the right hip, right foot noted for bruising and no laceration or open wound noted.  Provided hydrocodone 5 mg while awaiting x-rays.  X-ray of his right hip did not show any acute fracture or acute pathology.  Foot x-ray no fracture, no acute findings.  These results were discussed at length with patient, he was advised to follow-up with his chronic pain management physician, he is agreeable with discharge at this time, will call or write in order to be picked up.  Patient discharged from the ED in stable condition.   Portions of this note were generated with [employer]. Dictation errors may occur despite best attempts at proofreading.  Final Clinical Impression(s) / ED Diagnoses Final diagnoses:  Fall, initial encounter  Right hip pain    Rx / DC Orders ED Discharge Orders     None        Janeece Fitting, PA-C 03/28/21 1201    Hayden Rasmussen, MD 03/28/21 1454

## 2021-03-29 ENCOUNTER — Encounter: Payer: Self-pay | Admitting: Family Medicine

## 2021-03-29 ENCOUNTER — Ambulatory Visit (INDEPENDENT_AMBULATORY_CARE_PROVIDER_SITE_OTHER): Payer: Medicare HMO | Admitting: Pharmacist

## 2021-03-29 DIAGNOSIS — E785 Hyperlipidemia, unspecified: Secondary | ICD-10-CM

## 2021-03-29 DIAGNOSIS — IMO0002 Reserved for concepts with insufficient information to code with codable children: Secondary | ICD-10-CM

## 2021-03-29 DIAGNOSIS — E1140 Type 2 diabetes mellitus with diabetic neuropathy, unspecified: Secondary | ICD-10-CM

## 2021-03-29 NOTE — Chronic Care Management (AMB) (Signed)
Chronic Care Management Pharmacy Note  03/29/2021 Name:  Tyler Brewer MRN:  [id number] DOB:  February 18, 1961  Subjective: Tyler Brewer is an 60 y.o. year old male who is a primary patient of [name], DO.  The CCM team was consulted for assistance with disease management and care coordination needs.    Collaboration with patient and manufacturer patient assistance program  for  coordination of PAP for insuiln  in response to provider referral for pharmacy case management and/or care coordination services.   Consent to Services:  The patient was given information about Chronic Care Management services, agreed to services, and gave verbal consent prior to initiation of services.  Please see initial visit note for detailed documentation.   Patient Care Team: Carollee Herter, Alferd Apa, DO as PCP - General (Family Medicine) Alda Berthold, DO as Consulting Physician (Neurology) Lajuana Matte, MD as Consulting Physician (Cardiothoracic Surgery) Marlaine Hind, MD as Consulting Physician (Physical Medicine and Rehabilitation) Luretha Rued, RN as Case Manager Cherre Robins, PharmD (Pharmacist) [facility], Melanie Crazier, MD as Consulting Physician (Endocrinology) Francis Gaines, LCSW as [place] Management  Recent office visits: 01/12/2021 - PCP (Dr Etter Sjogren) F/U visit; recommended COVID vaccine and colonoscopy; no medication changes.  11/28/2020 - PCP (Dr Etter Sjogren) Hospital f/u; c/o LEE; Refilled furosemide 90m take 1 tablet daily and tramadol 541m1 or 2 tablet every 8 hours. Referred to Endo for DM.   Recent consult visits: 03/27/2021 - Neurology - (Dr PaPosey Prontopolyrediculoneuropathy. No medication changes.   03/24/2021 - [facility] ([facility] Repeat EMG/NCS to assess for diabetic amyotrophy versus demyelinating process. Labs checked. No med changes. F/U 3 months.   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 No medication changes. Recommended optimize control of DM and physical therapy to improve strength.   01/03/2021 - Endo (Dr ShKelton PillarInitial consult for insulin dependent type 2 DM. Started Metformin 1 tablet daily with Breakfast for 1 week, then increase to 1 tablet with Breakfast and 1 tablet with Supper for  1 week, then increase to 2 tablets with Breakfast  and 1 tablet with Supper for another 1 week , then finally 2 Tablets with Breakfast and 2 tablets with Supper.  Increased Tresiba to 45 units daily. Increase Novolog to 12 units with each meal     Hospital visits 03/16/2021 - ED Visit - for neuropathy - no med changes.  01/23/2021 - ED Visit - acute pain in left knee given oxycodone / APAP for 1 dose and IM toradol 01/21/2021 - Ed Visit - left without being seen (MGershon Musselone) 01/20/2021 - ED Visit - abdominal pain; GIven GI cocktail with improved symptoms. No change to medication regimen 01/16/2021 - ED Visit - leg pain; Given ketorolac 3048mM; oxycodone 73m47m1 dose; prednisone 60mg37m dose and Rx to start methylprednisolone dose pack   Objective:  Lab Results  Component Value Date   CREATININE 0.82 03/16/2021   CREATININE 0.90 02/14/2021   CREATININE 0.89 02/13/2021    Lab Results  Component Value Date   HGBA1C 7.7 (H) 02/14/2021   Last diabetic Eye exam: No results found for: HMDIABEYEEXA  Last diabetic Foot exam: No results found for: HMDIABFOOTEX      Component Value Date/Time   CHOL 136 02/14/2021 1419   TRIG 102.0 02/14/2021 1419   HDL 55.50 02/14/2021 1419   CHOLHDL 2 02/14/2021 1419  VLDL 20.4 02/14/2021 1419   LDLCALC 60 02/14/2021 1419   LDLDIRECT 124.0 05/02/2017 1507    Hepatic Function Latest Ref Rng & Units 03/16/2021 02/14/2021 02/13/2021  Total Protein 6.5 - 8.1 g/dL 6.8 6.4 7.0  Albumin 3.5 - 5.0 g/dL 3.8 4.1 3.9  AST 15 - 41 U/L '22 12 17  ' ALT 0 - 44 U/L '29 17 21  ' Alk  Phosphatase 38 - 126 U/L 76 60 58  Total Bilirubin 0.3 - 1.2 mg/dL 0.5 0.4 0.4    Lab Results  Component Value Date/Time   TSH 2.57 02/14/2021 02:19 PM   TSH 2.948 11/05/2020 07:18 AM   TSH 2.66 03/10/2020 10:06 AM    CBC Latest Ref Rng & Units 03/16/2021 02/14/2021 02/13/2021  WBC 4.0 - 10.5 K/uL 6.0 6.8 5.8  Hemoglobin 13.0 - 17.0 g/dL 13.4 12.9(L) 13.4  Hematocrit 39.0 - 52.0 % 39.0 38.4(L) 39.4  Platelets 150 - 400 K/uL 243 289.0 248    No results found for: VD25OH  Clinical ASCVD: Yes  The 10-year ASCVD risk score Mikey Bussing DC Jr., et al., 2013) is: 14%   Values used to calculate the score:     Age: 63 years     Sex: Male     Is Non-Hispanic African American: No     Diabetic: Yes     Tobacco smoker: Yes     Systolic Blood Pressure: 454 mmHg     Is BP treated: Yes     HDL Cholesterol: 55.5 mg/dL     Total Cholesterol: 136 mg/dL     Social History   Tobacco Use  Smoking Status Every Day   Packs/day: 0.50   Types: Cigarettes   Last attempt to quit: 09/29/2020   Years since quitting: 0.4  Smokeless Tobacco Never   BP Readings from Last 3 Encounters:  03/21/21 114/72  03/16/21 (!) 176/99  02/18/21 (!) 168/95   Pulse Readings from Last 3 Encounters:  03/21/21 100  03/16/21 80  02/18/21 84   Wt Readings from Last 3 Encounters:  03/21/21 280 lb (127 kg)  03/16/21 280 lb (127 kg)  02/18/21 280 lb (127 kg)    Assessment: Review of patient past medical history, allergies, medications, health status, including review of consultants reports, laboratory and other test data, was performed as part of comprehensive evaluation and provision of chronic care management services.   SDOH:  (Social Determinants of Health) assessments and interventions performed:    CCM Care Plan  No Known Allergies  Medications Reviewed Today     Reviewed by Cherre Robins, PharmD (Pharmacist) on 03/29/21 at 77  Med List Status: <None>   Medication Order Taking? Sig Documenting  Provider Last Dose Status Informant  acetaminophen (TYLENOL) 500 MG tablet [id number] No Take 500 mg by mouth every 6 (six) hours as needed. States he is taking one to two tablets a day [provider] Taking Active Self  amLODipine (NORVASC) 10 MG tablet [id number] No Take 1 tablet (10 mg total) by mouth daily. [name] R, DO Taking Active   COMFORT EZ INSULIN SYRINGE 31G X 5/16" 1 ML MISC [id number] No  [provider] Taking Active   DULoxetine (CYMBALTA) 30 MG capsule [id number] No Take 1 capsule (30 mg total) by mouth daily.  Patient not taking: Reported on 03/27/2021   [name], DO Not Taking Active   furosemide (LASIX) 20 MG tablet [id number] No TAKE 1 TABLET(20 MG) BY MOUTH DAILY Carollee Herter, Alferd Apa, DO  Taking Active            Med Note Juleen China, Deno Etienne   Fri Mar 17, 2021  2:21 PM) Reports takes 41m two tablets every morning  furosemide (LASIX) 40 MG tablet [id number] Take by mouth.  Patient not taking: Reported on 03/27/2021   [provider] Not Taking Active   gabapentin (NEURONTIN) 300 MG capsule [id number] Take 600 mg by mouth 3 (three) times daily. [provider] Taking Active   hydrALAZINE (APRESOLINE) 50 MG tablet [id number] Take 1 tablet (50 mg total) by mouth every 8 (eight) hours. LRoma SchanzR, DO Taking Active   ibuprofen (ADVIL) 200 MG tablet [id number] Take 200 mg by mouth every 6 (six) hours as needed. [provider] Taking Active   insulin aspart (NOVOLOG FLEXPEN) 100 UNIT/ML FlexPen [id number] Inject 12 Units into the skin 3 (three) times daily with meals. [name], IMelanie Crazier MD Taking Active            Med Note (Juleen China JDeno Etienne  Fri Mar 17, 2021  2:25 PM) Reports takes 12 units tid with meals  insulin degludec (TRESIBA FLEXTOUCH) 200 UNIT/ML FlexTouch Pen [id number] Inject 46 Units into the skin daily. [name], IMelanie Crazier MD Taking Active   Insulin Pen Needle 31G X  5 MM MISC [id number] 1 Device by Does not apply route in the morning, at noon, in the evening, and at bedtime. [name], IMelanie Crazier MD Taking Active   losartan (COZAAR) 100 MG tablet [id number] Take 1 tablet (100 mg total) by mouth daily. LAnn Held DO Taking Active   meclizine (ANTIVERT) 25 MG tablet [id number] Take 1 tablet (25 mg total) by mouth 3 (three) times daily as needed for dizziness. Molpus, John, MD Taking Active            Med Note (Lynelle DoctorAug 8, 2022  1:24 PM) As needed  meloxicam (MOBIC) 15 MG tablet [id number] Take 1 tablet (15 mg total) by mouth daily. Kirsteins, ALuanna Salk MD Taking Active   Menthol, Topical Analgesic, (ICY HOT EX) [id number] Apply topically as needed. [provider] Taking Active   metaxalone (SKELAXIN) 400 MG tablet [id number] Take 1 tablet (400 mg total) by mouth 3 (three) times daily as needed. LAnn Held DO Taking Active   metFORMIN (GLUCOPHAGE-XR) 500 MG 24 hr tablet [id number] Take 2 tablets (1,000 mg total) by mouth in the morning and at bedtime. LAnn Held DO Taking Active   pravastatin (PRAVACHOL) 40 MG tablet [id number] Take 1 tablet (40 mg total) by mouth daily. LAnn Held DO Taking Active   pregabalin (LYRICA) 75 MG capsule [id number] Take 1 capsule (75 mg total) by mouth 2 (two) times daily. KCharlett Blake MD Taking Active   tamsulosin ([facility] 0.4 MG CAPS capsule [id number] Take 1 capsule (0.4 mg total) by mouth daily. LRoma SchanzR, DO Taking Active   traMADol (Veatrice Bourbon 50 MG tablet [id number] 2 po q6 h prn LAnn Held DO Taking Active            Med Note (Scheryl DarterJun 30, 2022  2:00 PM) Prescribed by YRoma Schanz DO. LD 02/16/21            Patient Active Problem List   Diagnosis Date Noted   CIDP (chronic  inflammatory demyelinating polyneuropathy) ([place]) 01/12/2021   Diabetes mellitus ([place])  01/03/2021   Type 2 diabetes mellitus with hyperglycemia, with long-term current use of insulin ([place]) 01/03/2021   Lower extremity edema 11/28/2020   Chronic low back pain 11/28/2020   Psoriatic arthritis ([place]) 11/28/2020   Thoracic aortic aneurysm without rupture ([place]) 11/28/2020   Bilateral leg weakness 11/05/2020   Acute left-sided low back pain with right-sided sciatica 07/19/2020   Hip pain, acute, left 07/05/2020   Tremor 07/05/2020   Weakness 07/05/2020   Balance problem 03/11/2020   Tinea cruris 03/11/2020   Cellulitis of left groin 03/11/2020   Acute pain of right shoulder 03/11/2020   Interstitial lung disease ([place]) 05/02/2017   Pansinusitis 09/26/2016   Diabetic polyneuropathy associated with diabetes mellitus due to underlying condition ([place]) 05/08/2016   Methamphetamine use disorder, severe, dependence ([place]) 02/11/2016   Substance induced mood disorder ([place]) 02/11/2016   Exertional dyspnea 11/30/2015   ADD (attention deficit disorder) 09/29/2015   Binge eating 09/29/2015   Syncope 05/05/2012   Diarrhea 05/05/2012   Panic attacks 05/05/2012   OSA (obstructive sleep apnea) 05/05/2012   HTN (hypertension) 05/05/2012   Dyslipidemia 05/05/2012   CAD (coronary artery disease)  cardiac cath with moderate disease in a septal branch of the ramus intermedius 04/01/2012   Chest pain 04/01/2012   Drug abuse and dependence ([place]) 04/01/2012   Family history of coronary artery disease 03/31/2012   Sleep apnea, on C-pap 03/31/2012   Hyperlipemia 03/30/2012   HTN (hypertension), benign 03/30/2012   Morbid obesity ([place]) 03/30/2012   DM type 2, uncontrolled, with neuropathy ([place]) 03/30/2012   Depression with suicidal ideation 03/30/2012    Immunization History  Administered Date(s) Administered   Tdap 08/20/2014    Conditions to be addressed/monitored: CAD, HTN, HLD, DMII and chronic pain (low back) interstitial lung disease; OSA  Care Plan : Diabetes Type 2 in patient with  history of Hyperlipidemia  Updates made by Cherre Robins, PHARMD since 03/29/2021 12:00 AM     Problem: Disease Progression   Priority: Medium     Long-Range Goal: Disease Progression Prevented or Minimized   Start Date: 12/06/2020  Expected End Date: 06/08/2021  Recent Progress: On track  Priority: Medium  Note:   Objective:  Lab Results  Component Value Date   HGBA1C 7.7 (H) 02/14/2021   Lab Results  Component Value Date   CREATININE 0.82 03/16/2021   CREATININE 0.90 02/14/2021   CREATININE 0.89 02/13/2021  Current Barriers:  Knowledge Deficits related to long term care plan for management of diabetes. Reports checking blood sugar as recommended. States blood sugar is within range 75% of the time. States blood sugar this morning was 122.  Limited family support, but reports he has supportive friends. Currently staying with friends. Limited mobility Unable to independently stand for activities such as cooking, cleaning. Transportation needs inconsistent-reports uses [facility] transportation for local appointments and states his uncle will be taking him to his appointment in [place] on 03/24/21. Case Manager Clinical Goal(s):  patient will demonstrate improved adherence to prescribed treatment plan for diabetes self care/management as evidenced by: daily monitoring and recording of CBG  adherence to prescribed medication regimen contacting provider for new or worsened symptoms or questions Interventions:  Collaboration with Carollee Herter, Alferd Apa, DO regarding development and update of comprehensive plan of care as evidenced by provider attestation and co-signature Inter-disciplinary care team collaboration (see longitudinal plan of care) Reviewed medications with patient and encouraged to continue to work with clinic  pharmacist regarding medications Reviewed scheduled/upcoming provider appointments. Re-consulted clinic social worker regarding level of care issues. Patient is interested in  permanent long term care. States if eligible is also open to skilled facility for rehabilitation. Discussed plans with patient for ongoing care management follow up and provided patient with direct contact information for care management team Patient Goals: - continue to attend provider visit as scheduled - continue to check blood sugar as recommended by provider and call provider with questions concerns.  - continue to take medications as prescribed and work with clinic pharmacist regarding medications. - know blood sugar target range and when to call doctor - know target Hemoglobin A1C - take the blood sugar meter to all doctor visits Follow Up Plan: The patient has been provided with contact information for the care management team and has been advised to call with any health related questions or concerns.  The care management team will reach out to the patient again over the next 45 days.     Care Plan : General Pharmacy (Adult)  Updates made by Cherre Robins, PHARMD since 03/29/2021 12:00 AM     Problem: Medication management and Chronic Care Management, support and education for the following conditions - HTN, Type2 DM with long term insuiln use; hyperlipidemia; neuropathy; OSA; institial lung disease;   Priority: High  Onset Date: 01/06/2021  Note:   Current Barriers:  Unable to independently afford treatment regimen Unable to independently monitor therapeutic efficacy Unable to achieve control of type 2 DM and HTN - imrpoved Does not adhere to prescribed medication regimen Frequent ED visits  Pharmacist Clinical Goal(s):  Over the next 45 days, patient will verbalize ability to afford treatment regimen achieve adherence to monitoring guidelines and medication adherence to achieve therapeutic efficacy achieve control of type 2 DM and HTN as evidenced by attainment of goals listed below Decrease frequency of ED visits  through collaboration with PharmD and provider.    Interventions: 1:1 collaboration with Carollee Herter, Alferd Apa, DO regarding development and update of comprehensive plan of care as evidenced by provider attestation and co-signature Inter-disciplinary care team collaboration (see longitudinal plan of care) Comprehensive medication review performed; medication list updated in electronic medical record   Interventions: 1:1 collaboration with Carollee Herter, Alferd Apa, DO regarding development and update of comprehensive plan of care as evidenced by provider attestation and co-signature Inter-disciplinary care team collaboration (see longitudinal plan of care) Comprehensive medication review performed; medication list updated in electronic medical record  Diabetes: Lab Results  Component Value Date   HGBA1C 7.7 (H) 02/14/2021  A1c 03/23/2021 at [facility] was 6.9%  Controlled - (goal A1c <7%) Much improved BG since started using Libre and taking insuiln regularly States cost of insulin still difficult but he cannot find application I sent for patient assistnace.  Current treatment: Tresiba 46 units once daily  Novolog 12 units three times a day with meals (told by pharmacist when he picked up prednisone to increase to 15 units with meals)  Metformin XR 541m - 2 Tablets with Breakfast and 2 tablets with Supper Interventions / Recommendations Recommended continue to take medications as directed by Dr SKelton PillarContinue to check BG [facility] Mailed application for [employer]PAP again. Completed patient portion of Novo Cares PAP application and return to Primary Care office.   Patient was denied LIS but he forgot to bring to letter to office.   Hypertension: BP Readings from Last 3 Encounters:  03/21/21 114/72  03/16/21 (!) 176/99  02/18/21 (!) 168/95  Last BP improved but still not consistently at goal - BP goal <140/90 Not checking BP at home regularly Current treatment: Amlodipine 26m daily   Hydralazine 548mevery 8 hours Losartan 100109maily  Interventions: (addressed at previous visits)  Counseled on importance of getting blood pressure to goal to prevent strokes and kidney damage Working to transition generic maintenance medications [facility].  Hyperlipidemia / Heart Disease: Lipid Panel     Component Value Date/Time   CHOL 118 11/28/2020 1502   TRIG 130.0 11/28/2020 1502   HDL 40.00 11/28/2020 1502   CHOLHDL 3 11/28/2020 1502   VLDL 26.0 11/28/2020 1502   LDLCALC 52 11/28/2020 1502  Controlled - LDL goal <70 Current treatment: Pravastatin 65m36m bedtime Interventions:  Counseled on LDL goals Recommended continue current regimen for cholesterol  Chronic Pain / Leg Weakness: Currently not controlled Patient has had several ED visits for pain and weakness in the last month. Has appointment with pain management 02/16/2021. Current treatment:  Tramadol 50mg32make 1 or 2 tablets up to every 8 hours as needed Methocarbamol 500mg 17mke 1 tablet 4 times a day as needed for muscle spasms Voltaren Gel 1% -  apply to area of pain up to 4 times a day as needed Prednisone  Interventions:  Continue current regimen for pain  Encouraged patient to follow up with neurology Ensure transportation has been set up for pain management appointment 02/16/2021  Continue with plan for testing by neurology  Patient Goals/Self-Care Activities Over the next 90 days, patient will:  take medications as prescribed focus on medication adherence by utilizing pharmacy service that delivers medications to his home, check glucose before meals and 2 hour after meals using CGM, document, and provide at future appointments collaborate with provider on medication access solutions   Follow up call planned In 2 weeks; Patient has phone appt with CCM nurse tomorrow 03/30/2021      Medication Assistance: Application for Novolog and Tresiba  medication assistance program.  in process.  Anticipated assistance start date 03/20/2021.  See plan of care for additional detail.  [id number] [facility] [place] - [address] [place] [id number]: [phone] Up:  Patient agrees to Care Plan and Follow-up.  Plan: The care management team will reach out to the patient again in 2 weeks.   Avryl Roehm Cherre RobinsmD Clinical Pharmacist [phone]

## 2021-03-29 NOTE — Telephone Encounter (Signed)
Flor with [facility] calling for Nursing for DM teaching. 1wk for 4 wks.   Verbal given.

## 2021-03-29 NOTE — Patient Instructions (Signed)
Visit Information  PATIENT GOALS:  Goals Addressed             This Visit's Progress    Chronic Care Management Pharmacy Care Plan   On track    Current Barriers:  Unable to independently afford treatment regimen Unable to independently monitor therapeutic efficacy Unable to achieve control of type 2 diabetes and high blood pressure / hypertension Does not adhere to prescribed medication regimen Frequent ED visits  Pharmacist Clinical Goal(s):  Over the next 45 days, patient will verbalize ability to afford treatment regimen achieve adherence to monitoring guidelines and medication adherence to achieve therapeutic efficacy achieve control of type 2 diabetes and high blood pressure / hypertension as evidenced by attainment of goals listed below Decrease frequency of ED visits through collaboration with PharmD and provider.   Interventions: 1:1 collaboration with Carollee Herter, Alferd Apa, DO regarding development and update of comprehensive plan of care as evidenced by provider attestation and co-signature Inter-disciplinary care team collaboration (see longitudinal plan of care) Comprehensive medication review performed; medication list updated in electronic medical record    Diabetes: Lab Results  Component Value Date   HGBA1C 7.7 (H) 02/14/2021  A1c 03/23/2021 at [facility] was 6.9%  Controlled - (goal A1c <7%) Much improved BG since started using Libre and taking insuiln regularly States cost of insulin still difficult but he cannot find application I sent for patient assistnace.  Current treatment: Tresiba 46 units once daily  Novolog 12 units three times a day with meals (told by pharmacist when he picked up prednisone to increase to 15 units with meals)  Metformin XR '500mg'$  - 2 Tablets with Breakfast and 2 tablets with Supper Interventions / Recommendations Recommended continue to take medications as directed by Dr [name] Continue to check BG frequently during day  with [facility]. Mailed application for [employer] PAP again. Completed patient portion of [employer] PAP application and return to Primary Care office.   Patient was denied LIS but he forgot to bring to letter to office.   Hypertension: BP Readings from Last 3 Encounters:  03/21/21 114/72  03/16/21 (!) 176/99  02/18/21 (!) 168/95  Last BP improved but still not consistently at goal - BP goal <140/90 Current treatment: Amlodipine '10mg'$  daily  Hydralazine '50mg'$  every 8 hours Losartan '100mg'$  daily  Interventions: (addressed at previous visits)  Counseled on importance of getting blood pressure to goal to prevent strokes and kidney damage Working to transition generic maintenance medications over [employer] order pharmacy.  Hyperlipidemia / Heart Disease: Lipid Panel     Component Value Date/Time   CHOL 118 11/28/2020 1502   TRIG 130.0 11/28/2020 1502   HDL 40.00 11/28/2020 1502   CHOLHDL 3 11/28/2020 1502   VLDL 26.0 11/28/2020 1502   LDLCALC 52 11/28/2020 1502  Controlled - LDL goal <70 Current treatment: Pravastatin '40mg'$  at bedtime Interventions:  Counseled on LDL goals Recommended continue current regimen for cholesterol  Chronic Pain / Leg Weakness: Currently not controlled Patient has had several ED visits for pain and weakness in the last month. Has appointment with pain management 02/16/2021. Current treatment:  Tramadol '50mg'$  - take 1 or 2 tablets up to every 8 hours as needed Methocarbamol '500mg'$  - take 1 tablet 4 times a day as needed for muscle spasms Voltaren Gel 1% -  apply to area of pain up to 4 times a day as needed Prednisone  Interventions:  Continue current regimen for pain  Encouraged patient to follow up  with neurology Ensure transportation has been set up for pain management appointment 02/16/2021  Continue with plan for testing by neurology  Patient Goals/Self-Care Activities Over the next 90 days, patient will:  take medications as  prescribed focus on medication adherence by utilizing pharmacy service that delivers medications to his home, check glucose before meals and 2 hour after meals using CGM, document, and provide at future appointments collaborate with provider on medication access solutions   Follow up call planned In 2 weeks; Patient has phone appt with CCM nurse tomorrow 03/30/2021      Monitor and Manage My Blood Sugar   On track    Timeframe:  Long-Range Goal Priority:  Medium Start Date:  12/06/20                           Expected End Date: 06/07/21                      Follow Up Date 04/17/21  - continue to attend provider visit as scheduled - continue to check blood sugar as recommended by provider and call provider with questions concerns.  - continue to take medications as prescribed and work with clinic pharmacist regarding medications. - know blood sugar target range and when to call doctor - know target Hemoglobin A1C - take the blood sugar meter to all doctor visits    Why is this important?   Checking your blood sugar at home helps to keep it from getting very high or very low.  Writing the results in a diary or log helps the doctor know how to care for you.  Your blood sugar log should have the time, date and the results.  Also, write down the amount of insulin or other medicine that you take.  Other information, like what you ate, exercise done and how you were feeling, will also be helpful.     Notes:         Will see CCM nurse tomorrow to won't send Care plan today incase there are updates  Telephone follow up appointment with care management team member scheduled for: 2 weeks  Cherre Robins, PharmD Clinical Pharmacist [facility]

## 2021-03-30 ENCOUNTER — Ambulatory Visit: Payer: Medicare HMO | Admitting: *Deleted

## 2021-03-30 DIAGNOSIS — IMO0002 Reserved for concepts with insufficient information to code with codable children: Secondary | ICD-10-CM

## 2021-03-30 DIAGNOSIS — F988 Other specified behavioral and emotional disorders with onset usually occurring in childhood and adolescence: Secondary | ICD-10-CM | POA: Diagnosis not present

## 2021-03-30 DIAGNOSIS — N401 Enlarged prostate with lower urinary tract symptoms: Secondary | ICD-10-CM | POA: Diagnosis not present

## 2021-03-30 DIAGNOSIS — M545 Low back pain, unspecified: Secondary | ICD-10-CM

## 2021-03-30 DIAGNOSIS — G4733 Obstructive sleep apnea (adult) (pediatric): Secondary | ICD-10-CM | POA: Diagnosis not present

## 2021-03-30 DIAGNOSIS — M1990 Unspecified osteoarthritis, unspecified site: Secondary | ICD-10-CM | POA: Diagnosis not present

## 2021-03-30 DIAGNOSIS — E1142 Type 2 diabetes mellitus with diabetic polyneuropathy: Secondary | ICD-10-CM | POA: Diagnosis not present

## 2021-03-30 DIAGNOSIS — G8929 Other chronic pain: Secondary | ICD-10-CM | POA: Diagnosis not present

## 2021-03-30 DIAGNOSIS — F32A Depression, unspecified: Secondary | ICD-10-CM | POA: Diagnosis not present

## 2021-03-30 DIAGNOSIS — K589 Irritable bowel syndrome without diarrhea: Secondary | ICD-10-CM | POA: Diagnosis not present

## 2021-03-30 DIAGNOSIS — N39498 Other specified urinary incontinence: Secondary | ICD-10-CM | POA: Diagnosis not present

## 2021-03-30 DIAGNOSIS — R2689 Other abnormalities of gait and mobility: Secondary | ICD-10-CM

## 2021-03-30 DIAGNOSIS — Z87891 Personal history of nicotine dependence: Secondary | ICD-10-CM | POA: Diagnosis not present

## 2021-03-30 DIAGNOSIS — I2510 Atherosclerotic heart disease of native coronary artery without angina pectoris: Secondary | ICD-10-CM | POA: Diagnosis not present

## 2021-03-30 DIAGNOSIS — E1121 Type 2 diabetes mellitus with diabetic nephropathy: Secondary | ICD-10-CM | POA: Diagnosis not present

## 2021-03-30 DIAGNOSIS — E1140 Type 2 diabetes mellitus with diabetic neuropathy, unspecified: Secondary | ICD-10-CM

## 2021-03-30 DIAGNOSIS — F410 Panic disorder [episodic paroxysmal anxiety] without agoraphobia: Secondary | ICD-10-CM

## 2021-03-30 DIAGNOSIS — Z9181 History of falling: Secondary | ICD-10-CM | POA: Diagnosis not present

## 2021-03-30 DIAGNOSIS — I10 Essential (primary) hypertension: Secondary | ICD-10-CM | POA: Diagnosis not present

## 2021-03-30 DIAGNOSIS — E7800 Pure hypercholesterolemia, unspecified: Secondary | ICD-10-CM | POA: Diagnosis not present

## 2021-03-30 DIAGNOSIS — Z794 Long term (current) use of insulin: Secondary | ICD-10-CM | POA: Diagnosis not present

## 2021-03-30 DIAGNOSIS — Z6841 Body Mass Index (BMI) 40.0 and over, adult: Secondary | ICD-10-CM | POA: Diagnosis not present

## 2021-03-30 DIAGNOSIS — M5441 Lumbago with sciatica, right side: Secondary | ICD-10-CM | POA: Diagnosis not present

## 2021-03-30 DIAGNOSIS — E1136 Type 2 diabetes mellitus with diabetic cataract: Secondary | ICD-10-CM | POA: Diagnosis not present

## 2021-03-30 DIAGNOSIS — J849 Interstitial pulmonary disease, unspecified: Secondary | ICD-10-CM | POA: Diagnosis not present

## 2021-03-30 NOTE — Patient Instructions (Signed)
Visit Information   PATIENT GOALS:   Goals Addressed               This Visit's Progress     Receive Short-Term Rehabilitative Services in a Skilled Nursing Facility. (pt-stated)   On track     Timeframe:  Short-Range Goal Priority:  High Start Date:  03/30/2021                        Expected End Date: 05/30/2021  Follow-Up Plan:  01/11/2021 at 1:30pm  Patient Goals/Self-Care Activities: Review list of skilled nursing facilities offering rehabilitative services and be prepared to provide LCSW with at least 4 facilities of interest during next scheduled telephone outreach.        Consent to CCM Services: Mr. Carr was given information about Chronic Care Management services including:  CCM service includes personalized support from designated clinical staff supervised by his physician, including individualized plan of care and coordination with other care providers 24/7 contact phone numbers for assistance for urgent and routine care needs. Service will only be billed when office clinical staff spend 20 minutes or more in a month to coordinate care. Only one practitioner may furnish and bill the service in a calendar month. The patient may stop CCM services at any time (effective at the end of the month) by phone call to the office staff. The patient will be responsible for cost sharing (co-pay) of up to 20% of the service fee (after annual deductible is met).  Patient agreed to services and verbal consent obtained.   Patient verbalizes understanding of instructions provided today and agrees to view in MyChart.   Telephone follow-up appointment with care management team member scheduled for:  04/13/2021 at 1:30pm.  Joanna Saporito LCSW Licensed Clinical Social Worker [facility] [phone]   CLINICAL CARE PLAN: Patient Care Plan: Quality of Life     Problem Identified: Quality of Life   Priority: High     Long-Range Goal: Quality of Life  Maintained/Improved   Start Date: 12/06/2020  Expected End Date: 06/08/2021  This Visit's Progress: On track  Recent Progress: On track  Priority: High  Note:   Current Barriers:  Knowledge Deficits related to long term care plan to improve/manage of quality of life. Client with history of chronic disease, uncontrolled DM. Reports continued leg weakness and falls with chronic pain. Second opinion appointment with neurologist scheduled for 03/24/21 at [facility] and a third opinion scheduled for 05/2021. He reports appointment for a nerve block is scheduled for 03/21/21. States he is interested in long term care facility placement. He also states he is open to skilled facility for rehab if he is eligible. He states he is completing paper work from the company that his is getting a motorized chair from. He reports he already has a ramp. Meals and in-home assistance continue to be provided by his roommates. And he reports he has spoken with social services regarding Medicaid services and plans are in place for someone to come to his home next week regarding the application process. Unable to independently stand for periods of time for house hold chores -support from friends.  Limited Transportation-reports supportive friends, but not always available to take client to appointments and for pharmacy refills. Nurse Case Manager Clinical Goal(s):  patient will verbalize understanding of plan for maintenance/improvement of quality of life. Work toward goal of "get back healthy enough to join [employer]" patient will attend   all scheduled medical appointments. patient will work with CM clinical social worker to discuss level of care concerns. the patient will demonstrate ongoing self health care management ability as evidenced by attending provider visits as scheduled, taking medications as prescribed, contacting provider for new or worsening symptoms Interventions:  1:1 collaboration with Lowne Chase,  Yvonne R, DO regarding development and update of comprehensive plan of care as evidenced by provider attestation and co-signature Inter-disciplinary care team collaboration (see longitudinal plan of care) Evaluation of current treatment plan related to patient's health conditions and patient's adherence to plan as established by provider. Reiterated fall prevention strategies; Encouraged client to continue exercises as re commended provided by previous home heath therapist.  Reviewed medications with patient and discussed importance of taking medications as prescribed. Reviewed upcoming provider appointments.  Discussed pain management strategies - encouraged client to attend provider visits and take medications as prescribed. Reiterated importance of adequate rest/sleep  Re-consulted clinic social worker regarding level of care issues. Patient is interested in permanent long term care. States if eligible is also open to skilled facility for rehabilitation. Discussed plans with patient for ongoing care management follow up and provided patient with direct contact information for care management team Patient Goals/Self-Care Activities -continue to take medications as prescribed and follow up with provider if condition is not relieved or worsens. -continue management of diabetes per your Endocrinologist recommendations -attend all provider appointment as scheduled. -try to get adequate rest/sleep each night -continue to use assistive devices as recommended and minimize fall risk(eliminate small throw rugs, keep home well lite, remove clutter from walkways), use hand rails where available, consider installing grab bars/rails in other places as needed. -complete forms for motorized chair.  -coordinate with clinic social worker regarding assistance with skilled care facility needs. -Contact RNCM if you have any questions or concerns or community resource needs. Follow Up Plan: The patient has been  provided with contact information for the care management team and has been advised to call with any health related questions or concerns.  The care management team will reach out to the patient again over the next 45 days.           Patient Care Plan: Diabetes Type 2 in patient with history of Hyperlipidemia     Problem Identified: Disease Progression   Priority: Medium     Long-Range Goal: Disease Progression Prevented or Minimized   Start Date: 12/06/2020  Expected End Date: 06/08/2021  Recent Progress: On track  Priority: Medium  Note:   Objective:  Lab Results  Component Value Date   HGBA1C 7.7 (H) 02/14/2021   Lab Results  Component Value Date   CREATININE 0.82 03/16/2021   CREATININE 0.90 02/14/2021   CREATININE 0.89 02/13/2021  Current Barriers:  Knowledge Deficits related to long term care plan for management of diabetes. Reports checking blood sugar as recommended. States blood sugar is within range 75% of the time. States blood sugar this morning was 122.  Limited family support, but reports he has supportive friends. Currently staying with friends. Limited mobility Unable to independently stand for activities such as cooking, cleaning. Transportation needs inconsistent-reports uses [facility] transportation for local appointments and states his uncle will be taking him to his appointment in [place] on 03/24/21. Case Manager Clinical Goal(s):  patient will demonstrate improved adherence to prescribed treatment plan for diabetes self care/management as evidenced by: daily monitoring and recording of CBG  adherence to prescribed medication regimen contacting provider for new or worsened symptoms or questions   Interventions:  Collaboration with Lowne Chase, Yvonne R, DO regarding development and update of comprehensive plan of care as evidenced by provider attestation and co-signature Inter-disciplinary care team collaboration (see longitudinal plan of care) Reviewed  medications with patient and encouraged to continue to work with clinic pharmacist regarding medications Reviewed scheduled/upcoming provider appointments. Re-consulted clinic social worker regarding level of care issues. Patient is interested in permanent long term care. States if eligible is also open to skilled facility for rehabilitation. Discussed plans with patient for ongoing care management follow up and provided patient with direct contact information for care management team Patient Goals: - continue to attend provider visit as scheduled - continue to check blood sugar as recommended by provider and call provider with questions concerns.  - continue to take medications as prescribed and work with clinic pharmacist regarding medications. - know blood sugar target range and when to call doctor - know target Hemoglobin A1C - take the blood sugar meter to all doctor visits Follow Up Plan: The patient has been provided with contact information for the care management team and has been advised to call with any health related questions or concerns.  The care management team will reach out to the patient again over the next 45 days.     Patient Care Plan: LCSW Plan of Care     Problem Identified: Receive Short-Term Rehabilitative Services in a Skilled Nursing Facility.   Priority: High     Goal: Receive Short-Term Rehabilitative Services in a Skilled Nursing Facility.   Start Date: 03/30/2021  Expected End Date: 05/30/2021  This Visit's Progress: On track  Recent Progress: Not on track  Priority: High  Note:   Current Barriers:   Patient with History of Falls/Fall Risk, Unsteady Gait, Impaired Mobility, Weakness, Tremor, Syncope, Balance Problem and Lower Extremity Edema needs assistance with initiating short-term rehabilitative services in a skilled nursing facility. Patient's health has declined and patient is no longer able to complete activities of daily living  independently. Patient has limited access to caregiver. Patient is experiencing memory deficits. Clinical Goal(s):  Over the next 60 days, patient will receive assistance with initiating short-term rehabilitative services in a skilled nursing facility.    Interventions: Collaboration with Primary Care Physician, Dr. Yvonne Lowne Chase regarding development and update of comprehensive plan of care as evidenced by provider attestation and co-signature. Inter-disciplinary care team collaboration (see longitudinal plan of care). Assessed needs and provided education on level of care and facility placement process. Completed FL-2 Form and faxed to Primary Care Physician, Dr. Yvonne Lowne Chase for review and signature. Obtained patient's verbal consent to fax FL-2 Form to facilities of interest. Obtained patient's verbal consent to initiate a PASSAR number. Reviewed and initiated process to obtain PASSAR number. Collaboration with representative from [employer] to try and obtain approval/prior authorization for short-term skilled nursing placement. Patient interviewed and appropriate assessments performed. Discussed plans with patient for ongoing care management follow-up and provided patient with direct contact information for care management team. Advised patient to answer all calls from LCSW, confirming contact information. Patient Goals/Self-Care Activities: Review list of skilled nursing facilities offering rehabilitative services and be prepared to provide LCSW with at least 4 facilities of interest during next scheduled telephone outreach. Follow-Up:  01/11/2021 at 1:30pm    Patient Care Plan: General Pharmacy (Adult)     Problem Identified: Medication management and Chronic Care Management, support and education for the following conditions - HTN, Type2 DM with long term insuiln use; hyperlipidemia;   neuropathy; OSA; institial lung disease;   Priority: High  Onset Date:  01/06/2021  Note:   Current Barriers:  Unable to independently afford treatment regimen Unable to independently monitor therapeutic efficacy Unable to achieve control of type 2 DM and HTN - imrpoved Does not adhere to prescribed medication regimen Frequent ED visits  Pharmacist Clinical Goal(s):  Over the next 45 days, patient will verbalize ability to afford treatment regimen achieve adherence to monitoring guidelines and medication adherence to achieve therapeutic efficacy achieve control of type 2 DM and HTN as evidenced by attainment of goals listed below Decrease frequency of ED visits  through collaboration with PharmD and provider.   Interventions: 1:1 collaboration with Lowne Chase, Yvonne R, DO regarding development and update of comprehensive plan of care as evidenced by provider attestation and co-signature Inter-disciplinary care team collaboration (see longitudinal plan of care) Comprehensive medication review performed; medication list updated in electronic medical record   Interventions: 1:1 collaboration with Lowne Chase, Yvonne R, DO regarding development and update of comprehensive plan of care as evidenced by provider attestation and co-signature Inter-disciplinary care team collaboration (see longitudinal plan of care) Comprehensive medication review performed; medication list updated in electronic medical record  Diabetes: Lab Results  Component Value Date   HGBA1C 7.7 (H) 02/14/2021  A1c 03/23/2021 at [facility] was 6.9%  Controlled - (goal A1c <7%) Much improved BG since started using Libre and taking insuiln regularly States cost of insulin still difficult but he cannot find application I sent for patient assistnace.  Current treatment: Tresiba 46 units once daily  Novolog 12 units three times a day with meals (told by pharmacist when he picked up prednisone to increase to 15 units with meals)  Metformin XR 500mg - 2 Tablets with Breakfast and 2 tablets  with Supper Interventions / Recommendations Recommended continue to take medications as directed by Dr [name] Continue to check BG frequently during day with Freestyle Libre. Mailed application for Novo Nordisk PAP again. Completed patient portion of [employer] PAP application and return to Primary Care office.   Patient was denied LIS but he forgot to bring to letter to office.   Hypertension: BP Readings from Last 3 Encounters:  03/21/21 114/72  03/16/21 (!) 176/99  02/18/21 (!) 168/95  Last BP improved but still not consistently at goal - BP goal <140/90 Not checking BP at home regularly Current treatment: Amlodipine 10mg daily  Hydralazine 50mg every 8 hours Losartan 100mg daily  Interventions: (addressed at previous visits)  Counseled on importance of getting blood pressure to goal to prevent strokes and kidney damage Working to transition generic maintenance medications over [employer] mail order pharmacy.  Hyperlipidemia / Heart Disease: Lipid Panel     Component Value Date/Time   CHOL 118 11/28/2020 1502   TRIG 130.0 11/28/2020 1502   HDL 40.00 11/28/2020 1502   CHOLHDL 3 11/28/2020 1502   VLDL 26.0 11/28/2020 1502   LDLCALC 52 11/28/2020 1502  Controlled - LDL goal <70 Current treatment: Pravastatin 40mg at bedtime Interventions:  Counseled on LDL goals Recommended continue current regimen for cholesterol  Chronic Pain / Leg Weakness: Currently not controlled Patient has had several ED visits for pain and weakness in the last month. Has appointment with pain management 02/16/2021. Current treatment:  Tramadol 50mg - take 1 or 2 tablets up to every 8 hours as needed Methocarbamol 500mg - take 1 tablet 4 times a day as needed for muscle spasms Voltaren Gel 1% -  apply to area   of pain up to 4 times a day as needed Prednisone  Interventions:  Continue current regimen for pain  Encouraged patient to follow up with neurology Ensure transportation has been set up for  pain management appointment 02/16/2021  Continue with plan for testing by neurology  Patient Goals/Self-Care Activities Over the next 90 days, patient will:  take medications as prescribed focus on medication adherence by utilizing pharmacy service that delivers medications to his home, check glucose before meals and 2 hour after meals using CGM, document, and provide at future appointments collaborate with provider on medication access solutions   Follow up call planned In 2 weeks; Patient has phone appt with CCM nurse tomorrow 03/30/2021       

## 2021-03-30 NOTE — Chronic Care Management (AMB) (Signed)
Chronic Care Management    Clinical Social Work Note  03/30/2021 Name: Tyler Brewer MRN: [id number] DOB: Oct 14, 1960  Tyler Brewer is a 60 y.o. year old male who is a primary care patient of [name], DO. The CCM team was consulted to assist the patient with chronic disease management and/or care coordination needs related to: Level of Care Concerns.   Engaged with patient by telephone for initial visit in response to provider referral for social work chronic care management and care coordination services.   Consent to Services:  The patient was given information about Chronic Care Management services, agreed to services, and gave verbal consent prior to initiation of services.  Please see initial visit note for detailed documentation.   Patient agreed to services and consent obtained.   Assessment: Review of patient past medical history, allergies, medications, and health status, including review of relevant consultants reports was performed today as part of a comprehensive evaluation and provision of chronic care management and care coordination services.     SDOH (Social Determinants of Health) assessments and interventions performed:  SDOH Interventions    Flowsheet Row Most Recent Value  SDOH Interventions   Depression Interventions/Treatment  Medication, Patient refuses Treatment        Advanced Directives Status: Not addressed in this encounter.  CCM Care Plan  No Known Allergies  Outpatient Encounter Medications as of 03/30/2021  Medication Sig Note   acetaminophen (TYLENOL) 500 MG tablet Take 500 mg by mouth every 6 (six) hours as needed. States he is taking one to two tablets a day    amLODipine (NORVASC) 10 MG tablet Take 1 tablet (10 mg total) by mouth daily.    COMFORT EZ INSULIN SYRINGE 31G X 5/16" 1 ML MISC     DULoxetine (CYMBALTA) 30 MG capsule Take 1 capsule (30 mg total) by mouth daily. (Patient not taking: Reported on 03/27/2021)    furosemide  (LASIX) 20 MG tablet TAKE 1 TABLET(20 MG) BY MOUTH DAILY 03/17/2021: Reports takes '20mg'$  two tablets every morning   furosemide (LASIX) 40 MG tablet Take by mouth. (Patient not taking: Reported on 03/27/2021)    gabapentin (NEURONTIN) 300 MG capsule Take 600 mg by mouth 3 (three) times daily.    hydrALAZINE (APRESOLINE) 50 MG tablet Take 1 tablet (50 mg total) by mouth every 8 (eight) hours.    ibuprofen (ADVIL) 200 MG tablet Take 200 mg by mouth every 6 (six) hours as needed.    insulin aspart (NOVOLOG FLEXPEN) 100 UNIT/ML FlexPen Inject 12 Units into the skin 3 (three) times daily with meals. 03/17/2021: Reports takes 12 units tid with meals   insulin degludec (TRESIBA FLEXTOUCH) 200 UNIT/ML FlexTouch Pen Inject 46 Units into the skin daily.    Insulin Pen Needle 31G X 5 MM MISC 1 Device by Does not apply route in the morning, at noon, in the evening, and at bedtime.    losartan (COZAAR) 100 MG tablet Take 1 tablet (100 mg total) by mouth daily.    meclizine (ANTIVERT) 25 MG tablet Take 1 tablet (25 mg total) by mouth 3 (three) times daily as needed for dizziness. 03/27/2021: As needed   meloxicam (MOBIC) 15 MG tablet Take 1 tablet (15 mg total) by mouth daily.    Menthol, Topical Analgesic, (ICY HOT EX) Apply topically as needed.    metaxalone (SKELAXIN) 400 MG tablet Take 1 tablet (400 mg total) by mouth 3 (three) times daily as needed.    metFORMIN (GLUCOPHAGE-XR) 500 MG  24 hr tablet Take 2 tablets (1,000 mg total) by mouth in the morning and at bedtime.    pravastatin (PRAVACHOL) 40 MG tablet Take 1 tablet (40 mg total) by mouth daily.    pregabalin (LYRICA) 75 MG capsule Take 1 capsule (75 mg total) by mouth 2 (two) times daily.    tamsulosin (FLOMAX) 0.4 MG CAPS capsule Take 1 capsule (0.4 mg total) by mouth daily.    traMADol (ULTRAM) 50 MG tablet 2 po q6 h prn 02/16/2021: Prescribed by [name], DO. LD 02/16/21   No facility-administered encounter medications on file as of 03/30/2021.     Patient Active Problem List   Diagnosis Date Noted   CIDP (chronic inflammatory demyelinating polyneuropathy) ([place]) 01/12/2021   Diabetes mellitus ([place]) 01/03/2021   Type 2 diabetes mellitus with hyperglycemia, with long-term current use of insulin ([place]) 01/03/2021   Lower extremity edema 11/28/2020   Chronic low back pain 11/28/2020   Psoriatic arthritis ([place]) 11/28/2020   Thoracic aortic aneurysm without rupture ([place]) 11/28/2020   Bilateral leg weakness 11/05/2020   Acute left-sided low back pain with right-sided sciatica 07/19/2020   Hip pain, acute, left 07/05/2020   Tremor 07/05/2020   Weakness 07/05/2020   Balance problem 03/11/2020   Tinea cruris 03/11/2020   Cellulitis of left groin 03/11/2020   Acute pain of right shoulder 03/11/2020   Interstitial lung disease ([place]) 05/02/2017   Pansinusitis 09/26/2016   Diabetic polyneuropathy associated with diabetes mellitus due to underlying condition ([place]) 05/08/2016   Methamphetamine use disorder, severe, dependence ([place]) 02/11/2016   Substance induced mood disorder ([place]) 02/11/2016   Exertional dyspnea 11/30/2015   ADD (attention deficit disorder) 09/29/2015   Binge eating 09/29/2015   Syncope 05/05/2012   Diarrhea 05/05/2012   Panic attacks 05/05/2012   OSA (obstructive sleep apnea) 05/05/2012   HTN (hypertension) 05/05/2012   Dyslipidemia 05/05/2012   CAD (coronary artery disease)  cardiac cath with moderate disease in a septal branch of the ramus intermedius 04/01/2012   Chest pain 04/01/2012   Drug abuse and dependence ([place]) 04/01/2012   Family history of coronary artery disease 03/31/2012   Sleep apnea, on C-pap 03/31/2012   Hyperlipemia 03/30/2012   HTN (hypertension), benign 03/30/2012   Morbid obesity ([place]) 03/30/2012   DM type 2, uncontrolled, with neuropathy ([place]) 03/30/2012   Depression with suicidal ideation 03/30/2012    Conditions to be addressed/monitored:  Debilitation and Mobility Issues.   Level of  Care Concerns, ADL/IADL Limitations, and Limited Access to Caregiver.  Care Plan : LCSW Plan of Care  Updates made by Francis Gaines, LCSW since 03/30/2021 12:00 AM     Problem: Receive Short-Term Rehabilitative Services in a [facility].   Priority: High     Goal: Receive Short-Term Rehabilitative Services in a [facility].   Start Date: 03/30/2021  Expected End Date: 05/30/2021  This Visit's Progress: On track  Recent Progress: Not on track  Priority: High  Note:   Current Barriers:   Patient with History of Falls/Fall Risk, Unsteady Gait, Impaired Mobility, Weakness, Tremor, Syncope, Balance Problem and Lower Extremity Edema needs assistance with initiating short-term rehabilitative services in a skilled nursing facility. Patient's health has declined and patient is no longer able to complete activities of daily living independently. Patient has limited access to caregiver. Patient is experiencing memory deficits. Clinical Goal(s):  Over the next 60 days, patient will receive assistance with initiating short-term rehabilitative services in a skilled nursing facility.  Interventions: Collaboration with Primary Care Physician, Dr. [name] regarding development and update of comprehensive plan of care as evidenced by provider attestation and co-signature. Inter-disciplinary care team collaboration (see longitudinal plan of care). Assessed needs and provided education on level of care and facility placement process. Completed FL-2 Form and faxed to Primary Care Physician, Dr. [name] for review and signature. Obtained patient's verbal consent to fax FL-2 Form to facilities of interest. Obtained patient's verbal consent to initiate a PASSAR number. Reviewed and initiated process to obtain PASSAR number. Collaboration with representative from [facility] to try and obtain approval/prior authorization for short-term skilled  nursing placement. Patient interviewed and appropriate assessments performed. Discussed plans with patient for ongoing care management follow-up and provided patient with direct contact information for care management team. Advised patient to answer all calls from LCSW, confirming contact information. Patient Goals/Self-Care Activities: Review list of skilled nursing facilities offering rehabilitative services and be prepared to provide LCSW with at least 4 facilities of interest during next scheduled telephone outreach. Follow-Up:  01/11/2021 at 1:30pm      Follow-Up Plan:  LCSW will follow-up with patient by phone on 01/11/2021 at 1:30pm.      Nat Christen [place] Licensed Clinical Social Worker [place] [phone]

## 2021-03-30 NOTE — Telephone Encounter (Signed)
FYI

## 2021-03-31 ENCOUNTER — Encounter: Payer: Self-pay | Admitting: Family Medicine

## 2021-03-31 DIAGNOSIS — F410 Panic disorder [episodic paroxysmal anxiety] without agoraphobia: Secondary | ICD-10-CM | POA: Diagnosis not present

## 2021-03-31 DIAGNOSIS — I10 Essential (primary) hypertension: Secondary | ICD-10-CM | POA: Diagnosis not present

## 2021-03-31 DIAGNOSIS — Z9181 History of falling: Secondary | ICD-10-CM | POA: Diagnosis not present

## 2021-03-31 DIAGNOSIS — M1990 Unspecified osteoarthritis, unspecified site: Secondary | ICD-10-CM | POA: Diagnosis not present

## 2021-03-31 DIAGNOSIS — I2510 Atherosclerotic heart disease of native coronary artery without angina pectoris: Secondary | ICD-10-CM | POA: Diagnosis not present

## 2021-03-31 DIAGNOSIS — E1136 Type 2 diabetes mellitus with diabetic cataract: Secondary | ICD-10-CM | POA: Diagnosis not present

## 2021-03-31 DIAGNOSIS — Z6841 Body Mass Index (BMI) 40.0 and over, adult: Secondary | ICD-10-CM | POA: Diagnosis not present

## 2021-03-31 DIAGNOSIS — N401 Enlarged prostate with lower urinary tract symptoms: Secondary | ICD-10-CM | POA: Diagnosis not present

## 2021-03-31 DIAGNOSIS — F32A Depression, unspecified: Secondary | ICD-10-CM | POA: Diagnosis not present

## 2021-03-31 DIAGNOSIS — M5441 Lumbago with sciatica, right side: Secondary | ICD-10-CM | POA: Diagnosis not present

## 2021-03-31 DIAGNOSIS — J849 Interstitial pulmonary disease, unspecified: Secondary | ICD-10-CM | POA: Diagnosis not present

## 2021-03-31 DIAGNOSIS — G4733 Obstructive sleep apnea (adult) (pediatric): Secondary | ICD-10-CM | POA: Diagnosis not present

## 2021-03-31 DIAGNOSIS — E1121 Type 2 diabetes mellitus with diabetic nephropathy: Secondary | ICD-10-CM | POA: Diagnosis not present

## 2021-03-31 DIAGNOSIS — G8929 Other chronic pain: Secondary | ICD-10-CM | POA: Diagnosis not present

## 2021-03-31 DIAGNOSIS — K589 Irritable bowel syndrome without diarrhea: Secondary | ICD-10-CM | POA: Diagnosis not present

## 2021-03-31 DIAGNOSIS — F988 Other specified behavioral and emotional disorders with onset usually occurring in childhood and adolescence: Secondary | ICD-10-CM | POA: Diagnosis not present

## 2021-03-31 DIAGNOSIS — N39498 Other specified urinary incontinence: Secondary | ICD-10-CM | POA: Diagnosis not present

## 2021-03-31 DIAGNOSIS — E1142 Type 2 diabetes mellitus with diabetic polyneuropathy: Secondary | ICD-10-CM | POA: Diagnosis not present

## 2021-03-31 DIAGNOSIS — Z87891 Personal history of nicotine dependence: Secondary | ICD-10-CM | POA: Diagnosis not present

## 2021-03-31 DIAGNOSIS — Z794 Long term (current) use of insulin: Secondary | ICD-10-CM | POA: Diagnosis not present

## 2021-03-31 DIAGNOSIS — E7800 Pure hypercholesterolemia, unspecified: Secondary | ICD-10-CM | POA: Diagnosis not present

## 2021-04-01 DIAGNOSIS — M7989 Other specified soft tissue disorders: Secondary | ICD-10-CM | POA: Diagnosis not present

## 2021-04-01 DIAGNOSIS — R609 Edema, unspecified: Secondary | ICD-10-CM | POA: Diagnosis not present

## 2021-04-01 DIAGNOSIS — R531 Weakness: Secondary | ICD-10-CM | POA: Diagnosis not present

## 2021-04-01 DIAGNOSIS — R0602 Shortness of breath: Secondary | ICD-10-CM | POA: Diagnosis not present

## 2021-04-01 DIAGNOSIS — R262 Difficulty in walking, not elsewhere classified: Secondary | ICD-10-CM | POA: Diagnosis not present

## 2021-04-01 DIAGNOSIS — M545 Low back pain, unspecified: Secondary | ICD-10-CM | POA: Diagnosis not present

## 2021-04-01 DIAGNOSIS — M47817 Spondylosis without myelopathy or radiculopathy, lumbosacral region: Secondary | ICD-10-CM | POA: Diagnosis not present

## 2021-04-01 DIAGNOSIS — I10 Essential (primary) hypertension: Secondary | ICD-10-CM | POA: Diagnosis not present

## 2021-04-01 DIAGNOSIS — R079 Chest pain, unspecified: Secondary | ICD-10-CM | POA: Diagnosis not present

## 2021-04-01 DIAGNOSIS — R339 Retention of urine, unspecified: Secondary | ICD-10-CM | POA: Diagnosis not present

## 2021-04-01 DIAGNOSIS — R911 Solitary pulmonary nodule: Secondary | ICD-10-CM | POA: Diagnosis not present

## 2021-04-01 DIAGNOSIS — Z743 Need for continuous supervision: Secondary | ICD-10-CM | POA: Diagnosis not present

## 2021-04-01 DIAGNOSIS — M5136 Other intervertebral disc degeneration, lumbar region: Secondary | ICD-10-CM | POA: Diagnosis not present

## 2021-04-01 DIAGNOSIS — M47816 Spondylosis without myelopathy or radiculopathy, lumbar region: Secondary | ICD-10-CM | POA: Diagnosis not present

## 2021-04-01 DIAGNOSIS — R29898 Other symptoms and signs involving the musculoskeletal system: Secondary | ICD-10-CM | POA: Diagnosis not present

## 2021-04-01 DIAGNOSIS — I712 Thoracic aortic aneurysm, without rupture: Secondary | ICD-10-CM | POA: Diagnosis not present

## 2021-04-03 DIAGNOSIS — R0602 Shortness of breath: Secondary | ICD-10-CM | POA: Diagnosis not present

## 2021-04-05 DIAGNOSIS — N401 Enlarged prostate with lower urinary tract symptoms: Secondary | ICD-10-CM | POA: Diagnosis not present

## 2021-04-05 DIAGNOSIS — G8929 Other chronic pain: Secondary | ICD-10-CM | POA: Diagnosis not present

## 2021-04-05 DIAGNOSIS — I10 Essential (primary) hypertension: Secondary | ICD-10-CM | POA: Diagnosis not present

## 2021-04-05 DIAGNOSIS — F32A Depression, unspecified: Secondary | ICD-10-CM | POA: Diagnosis not present

## 2021-04-05 DIAGNOSIS — E1121 Type 2 diabetes mellitus with diabetic nephropathy: Secondary | ICD-10-CM | POA: Diagnosis not present

## 2021-04-05 DIAGNOSIS — M5441 Lumbago with sciatica, right side: Secondary | ICD-10-CM | POA: Diagnosis not present

## 2021-04-05 DIAGNOSIS — K589 Irritable bowel syndrome without diarrhea: Secondary | ICD-10-CM | POA: Diagnosis not present

## 2021-04-05 DIAGNOSIS — Z9181 History of falling: Secondary | ICD-10-CM | POA: Diagnosis not present

## 2021-04-05 DIAGNOSIS — F988 Other specified behavioral and emotional disorders with onset usually occurring in childhood and adolescence: Secondary | ICD-10-CM | POA: Diagnosis not present

## 2021-04-05 DIAGNOSIS — J849 Interstitial pulmonary disease, unspecified: Secondary | ICD-10-CM | POA: Diagnosis not present

## 2021-04-05 DIAGNOSIS — F410 Panic disorder [episodic paroxysmal anxiety] without agoraphobia: Secondary | ICD-10-CM | POA: Diagnosis not present

## 2021-04-05 DIAGNOSIS — Z6841 Body Mass Index (BMI) 40.0 and over, adult: Secondary | ICD-10-CM | POA: Diagnosis not present

## 2021-04-05 DIAGNOSIS — G4733 Obstructive sleep apnea (adult) (pediatric): Secondary | ICD-10-CM | POA: Diagnosis not present

## 2021-04-05 DIAGNOSIS — E7800 Pure hypercholesterolemia, unspecified: Secondary | ICD-10-CM | POA: Diagnosis not present

## 2021-04-05 DIAGNOSIS — Z794 Long term (current) use of insulin: Secondary | ICD-10-CM | POA: Diagnosis not present

## 2021-04-05 DIAGNOSIS — E1136 Type 2 diabetes mellitus with diabetic cataract: Secondary | ICD-10-CM | POA: Diagnosis not present

## 2021-04-05 DIAGNOSIS — N39498 Other specified urinary incontinence: Secondary | ICD-10-CM | POA: Diagnosis not present

## 2021-04-05 DIAGNOSIS — I2510 Atherosclerotic heart disease of native coronary artery without angina pectoris: Secondary | ICD-10-CM | POA: Diagnosis not present

## 2021-04-05 DIAGNOSIS — E1142 Type 2 diabetes mellitus with diabetic polyneuropathy: Secondary | ICD-10-CM | POA: Diagnosis not present

## 2021-04-05 DIAGNOSIS — Z87891 Personal history of nicotine dependence: Secondary | ICD-10-CM | POA: Diagnosis not present

## 2021-04-05 DIAGNOSIS — M1990 Unspecified osteoarthritis, unspecified site: Secondary | ICD-10-CM | POA: Diagnosis not present

## 2021-04-06 DIAGNOSIS — J849 Interstitial pulmonary disease, unspecified: Secondary | ICD-10-CM | POA: Diagnosis not present

## 2021-04-06 DIAGNOSIS — E1136 Type 2 diabetes mellitus with diabetic cataract: Secondary | ICD-10-CM | POA: Diagnosis not present

## 2021-04-06 DIAGNOSIS — Z794 Long term (current) use of insulin: Secondary | ICD-10-CM | POA: Diagnosis not present

## 2021-04-06 DIAGNOSIS — Z9181 History of falling: Secondary | ICD-10-CM | POA: Diagnosis not present

## 2021-04-06 DIAGNOSIS — M1990 Unspecified osteoarthritis, unspecified site: Secondary | ICD-10-CM | POA: Diagnosis not present

## 2021-04-06 DIAGNOSIS — G4733 Obstructive sleep apnea (adult) (pediatric): Secondary | ICD-10-CM | POA: Diagnosis not present

## 2021-04-06 DIAGNOSIS — K589 Irritable bowel syndrome without diarrhea: Secondary | ICD-10-CM | POA: Diagnosis not present

## 2021-04-06 DIAGNOSIS — F988 Other specified behavioral and emotional disorders with onset usually occurring in childhood and adolescence: Secondary | ICD-10-CM | POA: Diagnosis not present

## 2021-04-06 DIAGNOSIS — E7800 Pure hypercholesterolemia, unspecified: Secondary | ICD-10-CM | POA: Diagnosis not present

## 2021-04-06 DIAGNOSIS — I2510 Atherosclerotic heart disease of native coronary artery without angina pectoris: Secondary | ICD-10-CM | POA: Diagnosis not present

## 2021-04-06 DIAGNOSIS — Z87891 Personal history of nicotine dependence: Secondary | ICD-10-CM | POA: Diagnosis not present

## 2021-04-06 DIAGNOSIS — I10 Essential (primary) hypertension: Secondary | ICD-10-CM | POA: Diagnosis not present

## 2021-04-06 DIAGNOSIS — Z6841 Body Mass Index (BMI) 40.0 and over, adult: Secondary | ICD-10-CM | POA: Diagnosis not present

## 2021-04-06 DIAGNOSIS — E1121 Type 2 diabetes mellitus with diabetic nephropathy: Secondary | ICD-10-CM | POA: Diagnosis not present

## 2021-04-06 DIAGNOSIS — N39498 Other specified urinary incontinence: Secondary | ICD-10-CM | POA: Diagnosis not present

## 2021-04-06 DIAGNOSIS — F32A Depression, unspecified: Secondary | ICD-10-CM | POA: Diagnosis not present

## 2021-04-06 DIAGNOSIS — N401 Enlarged prostate with lower urinary tract symptoms: Secondary | ICD-10-CM | POA: Diagnosis not present

## 2021-04-06 DIAGNOSIS — G8929 Other chronic pain: Secondary | ICD-10-CM | POA: Diagnosis not present

## 2021-04-06 DIAGNOSIS — E1142 Type 2 diabetes mellitus with diabetic polyneuropathy: Secondary | ICD-10-CM | POA: Diagnosis not present

## 2021-04-06 DIAGNOSIS — M5441 Lumbago with sciatica, right side: Secondary | ICD-10-CM | POA: Diagnosis not present

## 2021-04-06 DIAGNOSIS — F410 Panic disorder [episodic paroxysmal anxiety] without agoraphobia: Secondary | ICD-10-CM | POA: Diagnosis not present

## 2021-04-11 ENCOUNTER — Ambulatory Visit: Payer: Medicare HMO | Admitting: Internal Medicine

## 2021-04-11 DIAGNOSIS — E7800 Pure hypercholesterolemia, unspecified: Secondary | ICD-10-CM | POA: Diagnosis not present

## 2021-04-11 DIAGNOSIS — I10 Essential (primary) hypertension: Secondary | ICD-10-CM | POA: Diagnosis not present

## 2021-04-11 DIAGNOSIS — E1142 Type 2 diabetes mellitus with diabetic polyneuropathy: Secondary | ICD-10-CM | POA: Diagnosis not present

## 2021-04-11 DIAGNOSIS — Z794 Long term (current) use of insulin: Secondary | ICD-10-CM | POA: Diagnosis not present

## 2021-04-11 DIAGNOSIS — I2510 Atherosclerotic heart disease of native coronary artery without angina pectoris: Secondary | ICD-10-CM | POA: Diagnosis not present

## 2021-04-11 DIAGNOSIS — G4733 Obstructive sleep apnea (adult) (pediatric): Secondary | ICD-10-CM | POA: Diagnosis not present

## 2021-04-11 DIAGNOSIS — Z87891 Personal history of nicotine dependence: Secondary | ICD-10-CM | POA: Diagnosis not present

## 2021-04-11 DIAGNOSIS — N401 Enlarged prostate with lower urinary tract symptoms: Secondary | ICD-10-CM | POA: Diagnosis not present

## 2021-04-11 DIAGNOSIS — N39498 Other specified urinary incontinence: Secondary | ICD-10-CM | POA: Diagnosis not present

## 2021-04-11 DIAGNOSIS — G8929 Other chronic pain: Secondary | ICD-10-CM | POA: Diagnosis not present

## 2021-04-11 DIAGNOSIS — Z6841 Body Mass Index (BMI) 40.0 and over, adult: Secondary | ICD-10-CM | POA: Diagnosis not present

## 2021-04-11 DIAGNOSIS — E1136 Type 2 diabetes mellitus with diabetic cataract: Secondary | ICD-10-CM | POA: Diagnosis not present

## 2021-04-11 DIAGNOSIS — F32A Depression, unspecified: Secondary | ICD-10-CM | POA: Diagnosis not present

## 2021-04-11 DIAGNOSIS — E1121 Type 2 diabetes mellitus with diabetic nephropathy: Secondary | ICD-10-CM | POA: Diagnosis not present

## 2021-04-11 DIAGNOSIS — M1990 Unspecified osteoarthritis, unspecified site: Secondary | ICD-10-CM | POA: Diagnosis not present

## 2021-04-11 DIAGNOSIS — J849 Interstitial pulmonary disease, unspecified: Secondary | ICD-10-CM | POA: Diagnosis not present

## 2021-04-11 DIAGNOSIS — F410 Panic disorder [episodic paroxysmal anxiety] without agoraphobia: Secondary | ICD-10-CM | POA: Diagnosis not present

## 2021-04-11 DIAGNOSIS — F988 Other specified behavioral and emotional disorders with onset usually occurring in childhood and adolescence: Secondary | ICD-10-CM | POA: Diagnosis not present

## 2021-04-11 DIAGNOSIS — Z9181 History of falling: Secondary | ICD-10-CM | POA: Diagnosis not present

## 2021-04-11 DIAGNOSIS — M5441 Lumbago with sciatica, right side: Secondary | ICD-10-CM | POA: Diagnosis not present

## 2021-04-11 DIAGNOSIS — K589 Irritable bowel syndrome without diarrhea: Secondary | ICD-10-CM | POA: Diagnosis not present

## 2021-04-12 ENCOUNTER — Emergency Department (HOSPITAL_BASED_OUTPATIENT_CLINIC_OR_DEPARTMENT_OTHER)
Admission: EM | Admit: 2021-04-12 | Discharge: 2021-04-12 | Disposition: A | Discharge status: Home / Self Care | Payer: Medicare HMO | Attending: Emergency Medicine | Admitting: Emergency Medicine

## 2021-04-12 ENCOUNTER — Encounter (HOSPITAL_BASED_OUTPATIENT_CLINIC_OR_DEPARTMENT_OTHER): Payer: Self-pay

## 2021-04-12 ENCOUNTER — Telehealth: Payer: Self-pay | Admitting: Pharmacist

## 2021-04-12 ENCOUNTER — Telehealth: Payer: Medicare HMO

## 2021-04-12 ENCOUNTER — Emergency Department (HOSPITAL_BASED_OUTPATIENT_CLINIC_OR_DEPARTMENT_OTHER): Payer: Medicare HMO

## 2021-04-12 ENCOUNTER — Other Ambulatory Visit: Payer: Self-pay

## 2021-04-12 DIAGNOSIS — G4733 Obstructive sleep apnea (adult) (pediatric): Secondary | ICD-10-CM | POA: Diagnosis not present

## 2021-04-12 DIAGNOSIS — J849 Interstitial pulmonary disease, unspecified: Secondary | ICD-10-CM | POA: Diagnosis not present

## 2021-04-12 DIAGNOSIS — G8929 Other chronic pain: Secondary | ICD-10-CM | POA: Insufficient documentation

## 2021-04-12 DIAGNOSIS — K753 Granulomatous hepatitis, not elsewhere classified: Secondary | ICD-10-CM | POA: Diagnosis not present

## 2021-04-12 DIAGNOSIS — I2510 Atherosclerotic heart disease of native coronary artery without angina pectoris: Secondary | ICD-10-CM | POA: Diagnosis not present

## 2021-04-12 DIAGNOSIS — E1142 Type 2 diabetes mellitus with diabetic polyneuropathy: Secondary | ICD-10-CM | POA: Diagnosis not present

## 2021-04-12 DIAGNOSIS — F410 Panic disorder [episodic paroxysmal anxiety] without agoraphobia: Secondary | ICD-10-CM | POA: Diagnosis not present

## 2021-04-12 DIAGNOSIS — I10 Essential (primary) hypertension: Secondary | ICD-10-CM | POA: Insufficient documentation

## 2021-04-12 DIAGNOSIS — E1136 Type 2 diabetes mellitus with diabetic cataract: Secondary | ICD-10-CM | POA: Diagnosis not present

## 2021-04-12 DIAGNOSIS — K429 Umbilical hernia without obstruction or gangrene: Secondary | ICD-10-CM | POA: Diagnosis not present

## 2021-04-12 DIAGNOSIS — M1990 Unspecified osteoarthritis, unspecified site: Secondary | ICD-10-CM | POA: Diagnosis not present

## 2021-04-12 DIAGNOSIS — Z7984 Long term (current) use of oral hypoglycemic drugs: Secondary | ICD-10-CM | POA: Diagnosis not present

## 2021-04-12 DIAGNOSIS — K589 Irritable bowel syndrome without diarrhea: Secondary | ICD-10-CM | POA: Diagnosis not present

## 2021-04-12 DIAGNOSIS — F17210 Nicotine dependence, cigarettes, uncomplicated: Secondary | ICD-10-CM | POA: Insufficient documentation

## 2021-04-12 DIAGNOSIS — R69 Illness, unspecified: Secondary | ICD-10-CM | POA: Diagnosis not present

## 2021-04-12 DIAGNOSIS — F988 Other specified behavioral and emotional disorders with onset usually occurring in childhood and adolescence: Secondary | ICD-10-CM | POA: Diagnosis not present

## 2021-04-12 DIAGNOSIS — E1121 Type 2 diabetes mellitus with diabetic nephropathy: Secondary | ICD-10-CM | POA: Diagnosis not present

## 2021-04-12 DIAGNOSIS — Z794 Long term (current) use of insulin: Secondary | ICD-10-CM | POA: Insufficient documentation

## 2021-04-12 DIAGNOSIS — E7800 Pure hypercholesterolemia, unspecified: Secondary | ICD-10-CM | POA: Diagnosis not present

## 2021-04-12 DIAGNOSIS — Z6841 Body Mass Index (BMI) 40.0 and over, adult: Secondary | ICD-10-CM | POA: Diagnosis not present

## 2021-04-12 DIAGNOSIS — Z87891 Personal history of nicotine dependence: Secondary | ICD-10-CM | POA: Diagnosis not present

## 2021-04-12 DIAGNOSIS — N499 Inflammatory disorder of unspecified male genital organ: Secondary | ICD-10-CM | POA: Diagnosis not present

## 2021-04-12 DIAGNOSIS — R1031 Right lower quadrant pain: Secondary | ICD-10-CM

## 2021-04-12 DIAGNOSIS — M5441 Lumbago with sciatica, right side: Secondary | ICD-10-CM | POA: Diagnosis not present

## 2021-04-12 DIAGNOSIS — N200 Calculus of kidney: Secondary | ICD-10-CM | POA: Diagnosis not present

## 2021-04-12 DIAGNOSIS — Z79899 Other long term (current) drug therapy: Secondary | ICD-10-CM | POA: Insufficient documentation

## 2021-04-12 DIAGNOSIS — R600 Localized edema: Secondary | ICD-10-CM | POA: Diagnosis not present

## 2021-04-12 DIAGNOSIS — R10813 Right lower quadrant abdominal tenderness: Secondary | ICD-10-CM | POA: Diagnosis not present

## 2021-04-12 DIAGNOSIS — Z9181 History of falling: Secondary | ICD-10-CM | POA: Diagnosis not present

## 2021-04-12 DIAGNOSIS — Z743 Need for continuous supervision: Secondary | ICD-10-CM | POA: Diagnosis not present

## 2021-04-12 DIAGNOSIS — R1030 Lower abdominal pain, unspecified: Secondary | ICD-10-CM | POA: Diagnosis not present

## 2021-04-12 DIAGNOSIS — N401 Enlarged prostate with lower urinary tract symptoms: Secondary | ICD-10-CM | POA: Diagnosis not present

## 2021-04-12 DIAGNOSIS — F32A Depression, unspecified: Secondary | ICD-10-CM | POA: Diagnosis not present

## 2021-04-12 DIAGNOSIS — K7689 Other specified diseases of liver: Secondary | ICD-10-CM | POA: Diagnosis not present

## 2021-04-12 DIAGNOSIS — N39498 Other specified urinary incontinence: Secondary | ICD-10-CM | POA: Diagnosis not present

## 2021-04-12 LAB — CBC WITH DIFFERENTIAL/PLATELET
Abs Immature Granulocytes: 0.02 10*3/uL (ref 0.00–0.07)
Basophils Absolute: 0.1 10*3/uL (ref 0.0–0.1)
Basophils Relative: 1 %
Eosinophils Absolute: 0.3 10*3/uL (ref 0.0–0.5)
Eosinophils Relative: 5 %
HCT: 40.6 % (ref 39.0–52.0)
Hemoglobin: 13.9 g/dL (ref 13.0–17.0)
Immature Granulocytes: 0 %
Lymphocytes Relative: 29 %
Lymphs Abs: 1.8 10*3/uL (ref 0.7–4.0)
MCH: 30.7 pg (ref 26.0–34.0)
MCHC: 34.2 g/dL (ref 30.0–36.0)
MCV: 89.6 fL (ref 80.0–100.0)
Monocytes Absolute: 0.7 10*3/uL (ref 0.1–1.0)
Monocytes Relative: 11 %
Neutro Abs: 3.3 10*3/uL (ref 1.7–7.7)
Neutrophils Relative %: 54 %
Platelets: 276 10*3/uL (ref 150–400)
RBC: 4.53 MIL/uL (ref 4.22–5.81)
RDW: 12.1 % (ref 11.5–15.5)
WBC: 6.3 10*3/uL (ref 4.0–10.5)
nRBC: 0 % (ref 0.0–0.2)

## 2021-04-12 LAB — URINALYSIS, ROUTINE W REFLEX MICROSCOPIC
Bilirubin Urine: NEGATIVE
Glucose, UA: 250 mg/dL — AB
Hgb urine dipstick: NEGATIVE
Ketones, ur: NEGATIVE mg/dL
Leukocytes,Ua: NEGATIVE
Nitrite: NEGATIVE
Protein, ur: NEGATIVE mg/dL
Specific Gravity, Urine: 1.02 (ref 1.005–1.030)
pH: 6 (ref 5.0–8.0)

## 2021-04-12 LAB — COMPREHENSIVE METABOLIC PANEL
ALT: 20 U/L (ref 0–44)
AST: 13 U/L — ABNORMAL LOW (ref 15–41)
Albumin: 3.8 g/dL (ref 3.5–5.0)
Alkaline Phosphatase: 74 U/L (ref 38–126)
Anion gap: 7 (ref 5–15)
BUN: 20 mg/dL (ref 6–20)
CO2: 29 mmol/L (ref 22–32)
Calcium: 8.9 mg/dL (ref 8.9–10.3)
Chloride: 101 mmol/L (ref 98–111)
Creatinine, Ser: 0.79 mg/dL (ref 0.61–1.24)
GFR, Estimated: >60 mL/min (ref >60)
Glucose, Bld: 105 mg/dL — ABNORMAL HIGH (ref 70–99)
Potassium: 3.6 mmol/L (ref 3.5–5.1)
Sodium: 137 mmol/L (ref 135–145)
Total Bilirubin: 0.3 mg/dL (ref 0.3–1.2)
Total Protein: 7 g/dL (ref 6.5–8.1)

## 2021-04-12 IMAGING — CT CT RENAL STONE PROTOCOL
2 of 4 series · 16 of 46 positions shown, 18 images · non-contrast
Comparison: CT abdomen/pelvis [DATE]

CLINICAL DATA: Flank pain, groin pain

EXAM:
CT ABDOMEN AND PELVIS WITHOUT CONTRAST
TECHNIQUE: Multidetector CT imaging of the abdomen and pelvis was performed
following the standard protocol without IV contrast.

[Series 2: axial st · axial · 0.98mm/px · z∈[-588,-38]mm · 13 of 120 slices shown, 15 images]
[im 5/120  soft-tissue]
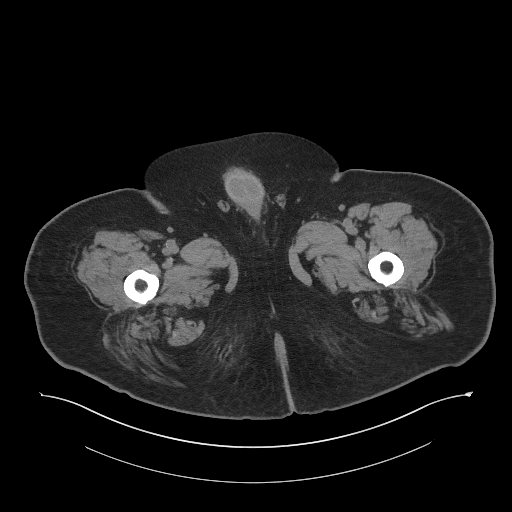
[im 5/120  bone]
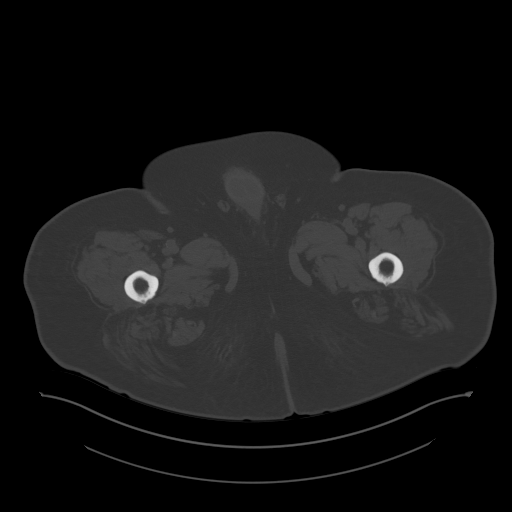
[im 15/120  soft-tissue]
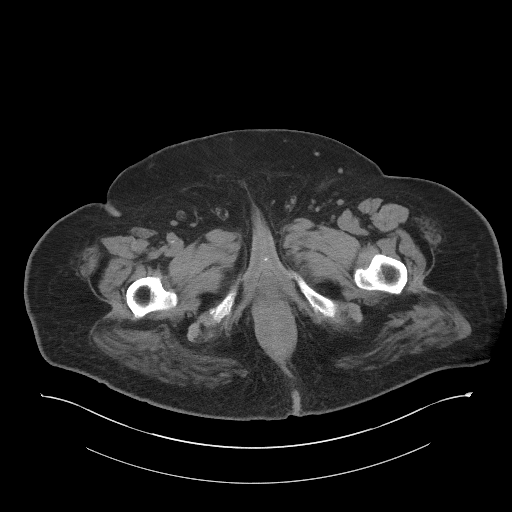
[im 25/120  soft-tissue]
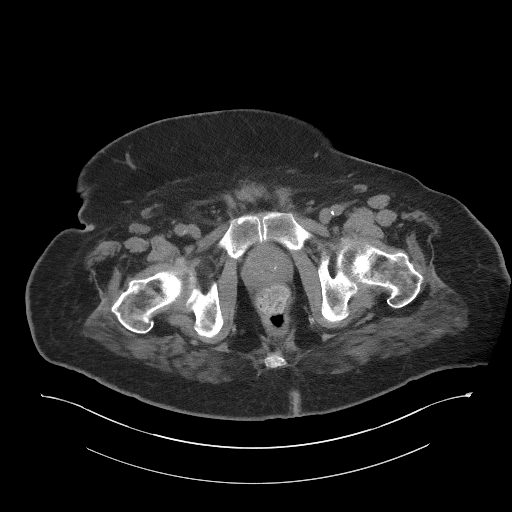
[im 35/120  soft-tissue]
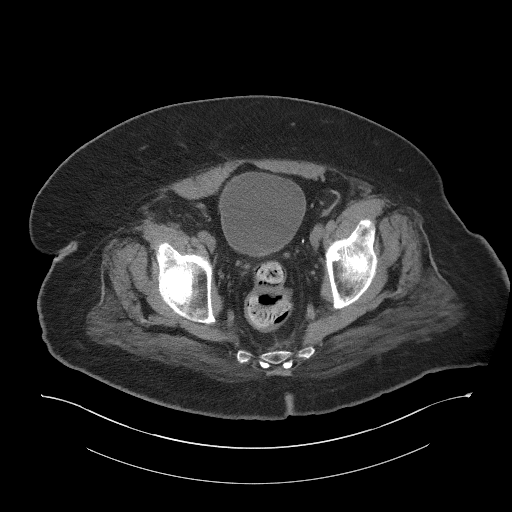
[im 40/120  soft-tissue]
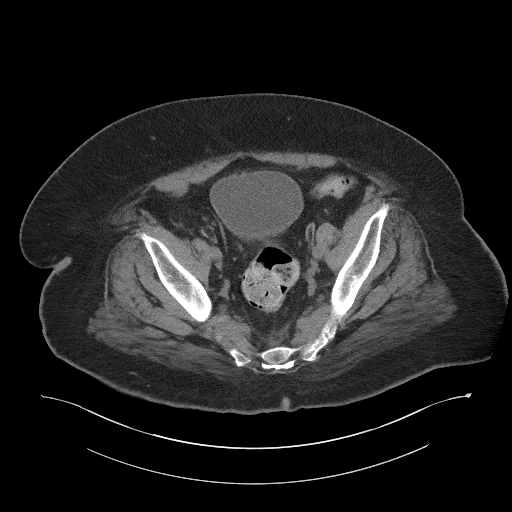
[im 50/120  soft-tissue]
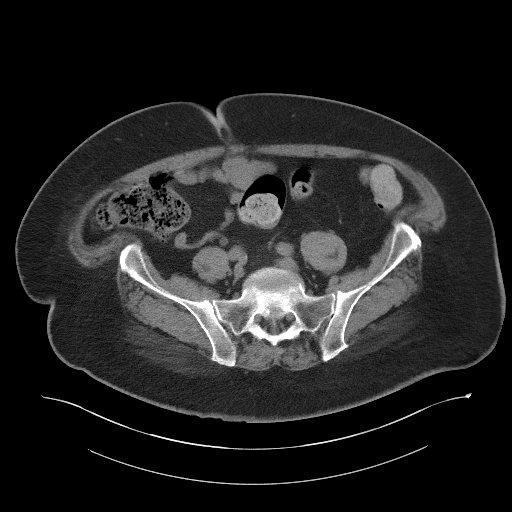
[im 60/120  soft-tissue]
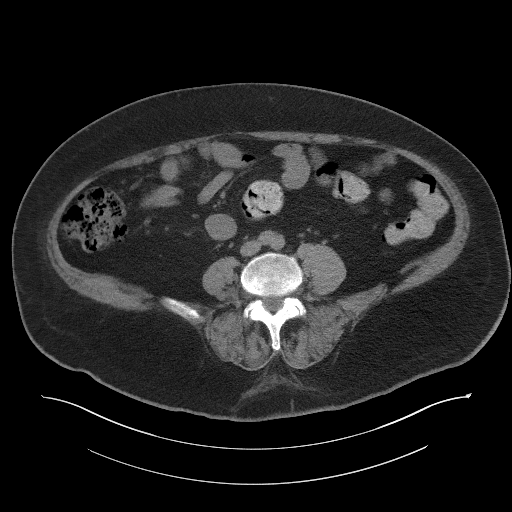
[im 70/120  soft-tissue]
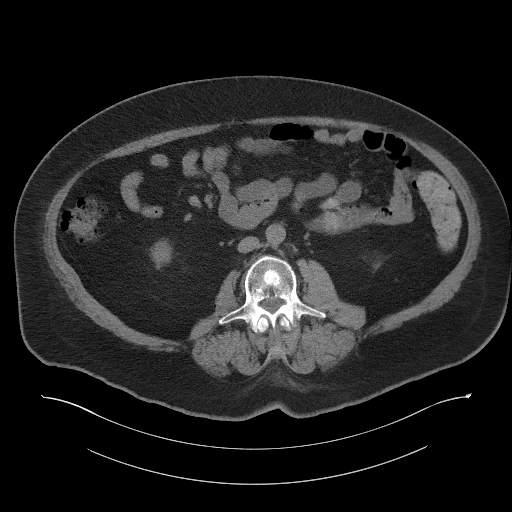
[im 80/120  soft-tissue]
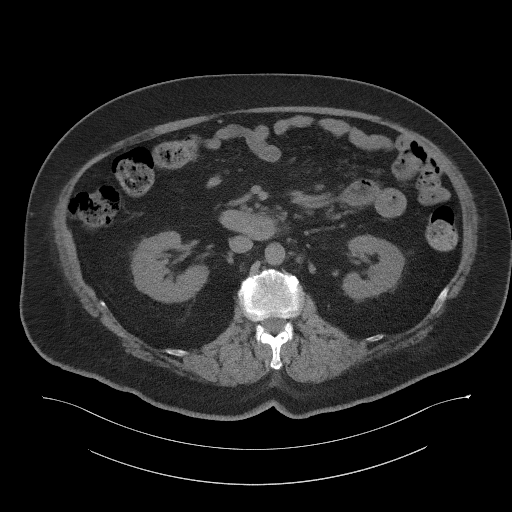
[im 80/120  bone]
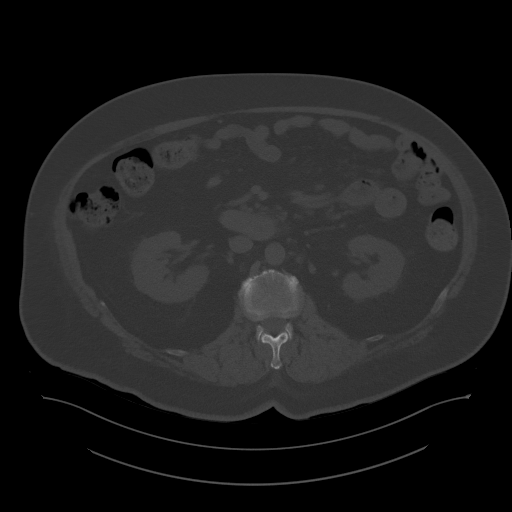
[im 85/120  soft-tissue]
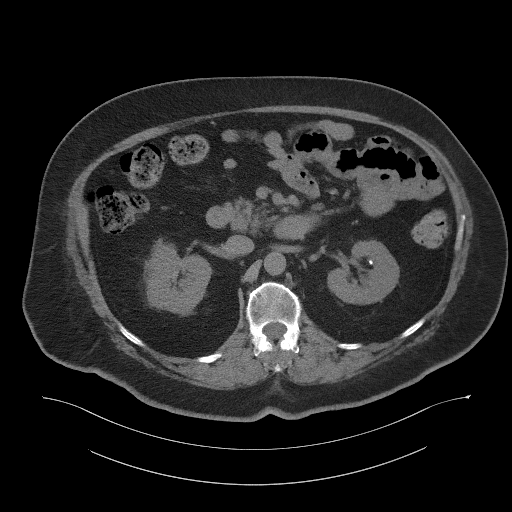
[im 95/120  soft-tissue]
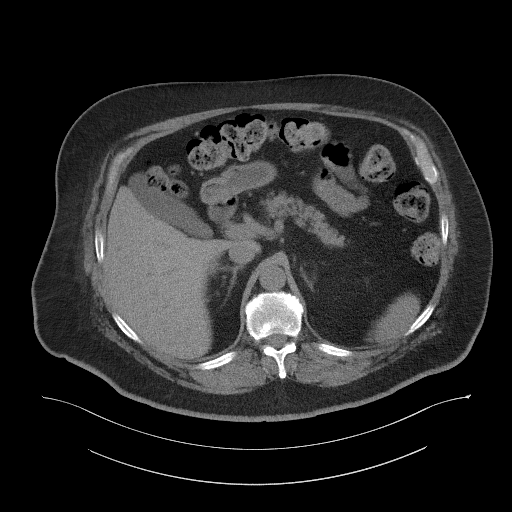
[im 105/120  soft-tissue]
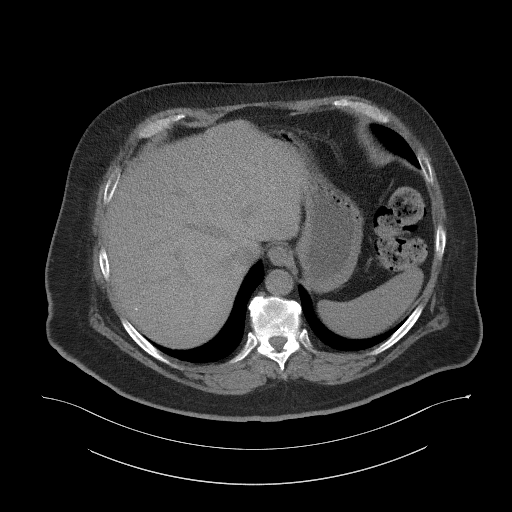
[im 115/120  soft-tissue]
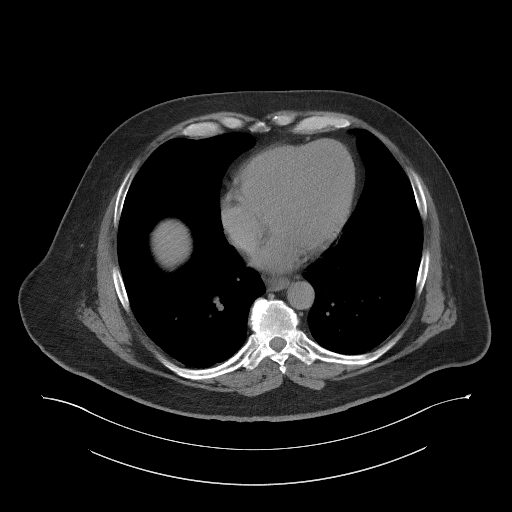

[Series 5: coronal st · coronal · 0.98mm/px · 3 of 118 slices shown]
[im 40/118  soft-tissue]
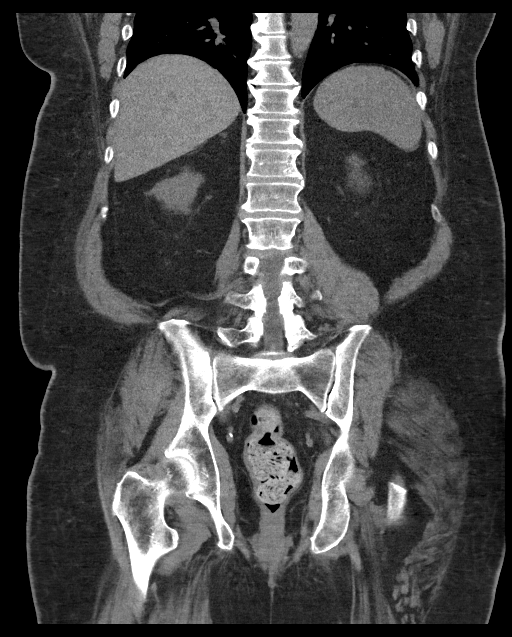
[im 53/118  soft-tissue]
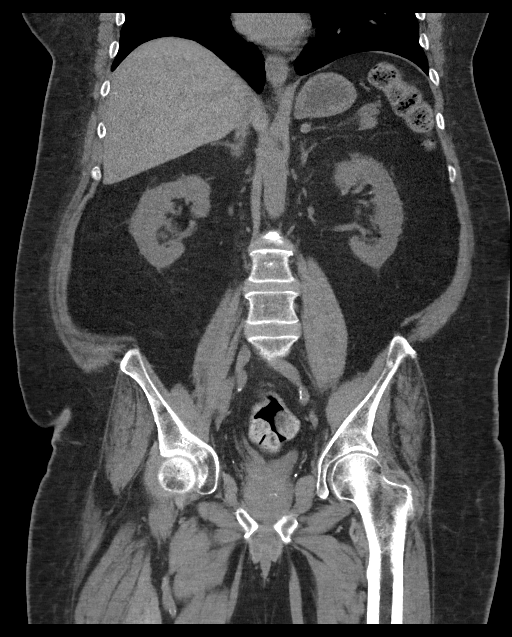
[im 66/118  soft-tissue]
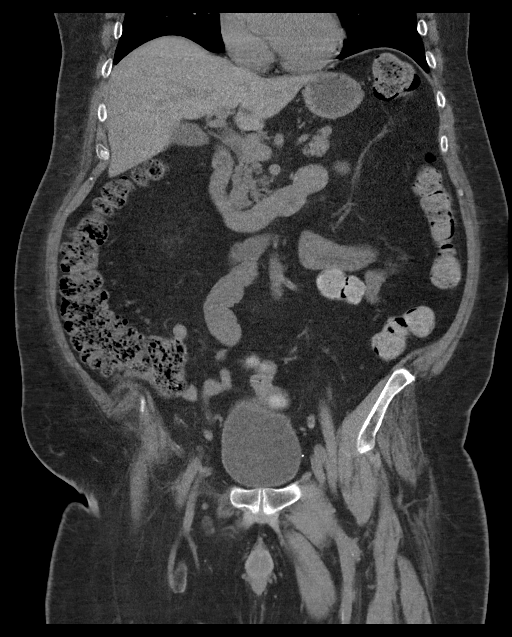

[16 of 46 positions shown; findings below may reference images not displayed]

FINDINGS: Lower chest: The lung bases are clear. The imaged heart is
unremarkable.

Hepatobiliary: There is a punctate calcified granuloma in the right
hepatic lobe, unchanged. The liver and gallbladder are otherwise
unremarkable. There is no biliary ductal dilatation.

Pancreas: Unremarkable.

Spleen: Unremarkable.

Adrenals/Urinary Tract: The adrenals are unremarkable.

There are 2 nonobstructing left renal stones measuring 4 mm in the
upper pole and 3 mm in the lower pole, stable to minimally increased
in size since [DATE]. No right renal stones are seen. There are
no focal parenchymal lesions. There is no hydronephrosis or
hydroureter. Bilateral perinephric stranding is unchanged,
nonspecific. The bladder is unremarkable.

Stomach/Bowel: The stomach is unremarkable. There is no evidence of
bowel obstruction. There is no abnormal bowel wall thickening or
inflammatory change. The appendix is not definitively identified,
but there is no pericecal inflammatory change.

Vascular/Lymphatic: The abdominal aorta is nonaneurysmal. A
prominent left external iliac chain lymph node measuring up to
cm in short axis is unchanged. There is no new or progressive
lymphadenopathy in the abdomen or pelvis.

Reproductive: The prostate and seminal vesicles are unremarkable.

Other: There is trace free fluid in the presacral space. There is no
upper abdominal ascites. There is no free intraperitoneal air. There
is a tiny fat containing umbilical hernia.

Musculoskeletal: There is a sclerotic lesion in the L3 vertebral
body, unchanged and likely benign. There is no acute osseous
abnormality.
IMPRESSION: 1. Two nonobstructing left renal stones measuring up to 4 mm. No
hydronephrosis or hydroureter.
2. Trace free fluid in the presacral space, nonspecific.
3. Otherwise, no acute findings in the abdomen or pelvis.

## 2021-04-12 MED ORDER — KETOROLAC TROMETHAMINE 15 MG/ML IJ SOLN
15.0000 mg | Freq: Once | INTRAMUSCULAR | Status: AC
  Administered 2021-04-12: 15 mg via INTRAVENOUS
  Filled 2021-04-12: qty 1

## 2021-04-12 NOTE — ED Notes (Signed)
Pt also c/o scrotum itching. Pt endorses concern fir UTI/Kidney infection

## 2021-04-12 NOTE — Telephone Encounter (Signed)
Second attempt for CCM pharmacy follow up to review BG and discussed insulin cost / patient assistance program. LM on VM with my contact number [phone]

## 2021-04-12 NOTE — ED Notes (Signed)
Patient transported to CT 

## 2021-04-12 NOTE — ED Triage Notes (Addendum)
Pt BIB [facility] d/t bilateral swelling in legs. Also having groin pain for the past few days. States feels like a UTI as he has had them before. States hurts from his "testicles to his kidneys".

## 2021-04-12 NOTE — ED Notes (Addendum)
Chaperone for EDP Dykstra. MD for scrotum/groin exam. Pt tolerated well.

## 2021-04-12 NOTE — ED Provider Notes (Signed)
[facility] EMERGENCY DEPARTMENT Provider Note   CSN: [id number] Arrival date & time: 04/12/21  [id number]     History Chief Complaint  Patient presents with   Groin Pain    Tyler Brewer is a 60 y.o. male.  PMH includes HLD, CAD, HTN , interstitial lung disease, DM2, OSA, morbid obesity, polysubstance use disorder.  He presents to the ED with 2-3 days of right flank pain that radiates down to right groin. It initially started out as intermittent. It is now constant. He also complains of pain when he finishes urinating. Denies hematuria. He complains of bilateral leg swelling which is chronic and nobody has found a cause for yet. Denies chest pain, shortness of breath, dizziness, syncope, fever, chills.     Groin Pain Associated symptoms include abdominal pain. Pertinent negatives include no chest pain, no headaches and no shortness of breath.      Past Medical History:  Diagnosis Date   Arthritis    CAD (coronary artery disease)  cardiac cath with moderate disease in a septal branch of the ramus intermedius 04/01/2012   Depression    Diabetes mellitus    poorly controlled by his report   History of narcotic addiction ([place])    past history of back pain   Hypercholesteremia    Hypertension    IBS (irritable bowel syndrome)    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[place])    Neuropathy    Obesity    Max weight was 390   OSA on CPAP    Panic attacks    Testosterone deficiency    Vertigo     Patient Active Problem List   Diagnosis Date Noted   CIDP (chronic inflammatory demyelinating polyneuropathy) ([place]) 01/12/2021   Diabetes mellitus ([place]) 01/03/2021   Type 2 diabetes mellitus with hyperglycemia, with long-term current use of insulin ([place]) 01/03/2021   Lower extremity edema 11/28/2020   Chronic low back pain 11/28/2020   Psoriatic arthritis ([place]) 11/28/2020   Thoracic aortic aneurysm without rupture ([place]) 11/28/2020   Bilateral leg weakness 11/05/2020   Acute left-sided  low back pain with right-sided sciatica 07/19/2020   Hip pain, acute, left 07/05/2020   Tremor 07/05/2020   Weakness 07/05/2020   Balance problem 03/11/2020   Tinea cruris 03/11/2020   Cellulitis of left groin 03/11/2020   Acute pain of right shoulder 03/11/2020   Interstitial lung disease ([place]) 05/02/2017   Pansinusitis 09/26/2016   Diabetic polyneuropathy associated with diabetes mellitus due to underlying condition ([place]) 05/08/2016   Methamphetamine use disorder, severe, dependence ([place]) 02/11/2016   Substance induced mood disorder ([place]) 02/11/2016   Exertional dyspnea 11/30/2015   ADD (attention deficit disorder) 09/29/2015   Binge eating 09/29/2015   Syncope 05/05/2012   Diarrhea 05/05/2012   Panic attacks 05/05/2012   OSA (obstructive sleep apnea) 05/05/2012   HTN (hypertension) 05/05/2012   Dyslipidemia 05/05/2012   CAD (coronary artery disease)  cardiac cath with moderate disease in a septal branch of the ramus intermedius 04/01/2012   Chest pain 04/01/2012   Drug abuse and dependence ([place]) 04/01/2012   Family history of coronary artery disease 03/31/2012   Sleep apnea, on C-pap 03/31/2012   Hyperlipemia 03/30/2012   HTN (hypertension), benign 03/30/2012   Morbid obesity ([place]) 03/30/2012   DM type 2, uncontrolled, with neuropathy ([place]) 03/30/2012   Depression with suicidal ideation 03/30/2012    Past Surgical History:  Procedure Laterality Date   CARDIAC CATHETERIZATION     IR FLUORO GUIDE CV LINE RIGHT  11/10/2020  IR US GUIDE VASC ACCESS RIGHT  11/10/2020   LEFT HEART CATHETERIZATION WITH CORONARY ANGIOGRAM N/A 03/31/2012   Procedure: LEFT HEART CATHETERIZATION WITH CORONARY ANGIOGRAM;  Surgeon: Leonie Man, MD;  Location: [facility] CATH [facility];  Service: Cardiovascular;  Laterality: N/A;       Family History  Problem Relation Age of Onset   Diabetes type II Father    Hypertension Father    Pancreatic disease Father 51       Deceased   Healthy Mother    Healthy  Sister    Healthy Son    Healthy Daughter    Parkinson's disease Maternal Grandmother     Social History   Tobacco Use   Smoking status: Every Day    Packs/day: 0.50    Types: Cigarettes    Last attempt to quit: 09/29/2020    Years since quitting: 0.5   Smokeless tobacco: Never  Vaping Use   Vaping Use: Never used  Substance Use Topics   Alcohol use: No   Drug use: Not Currently    Types: Methamphetamines    Home Medications Prior to Admission medications   Medication Sig Start Date End Date Taking? Authorizing Provider  acetaminophen (TYLENOL) 500 MG tablet Take 500 mg by mouth every 6 (six) hours as needed. States he is taking one to two tablets a day    [provider]  amLODipine (NORVASC) 10 MG tablet Take 1 tablet (10 mg total) by mouth daily. 01/26/21   [name], DO  COMFORT EZ INSULIN SYRINGE 31G X 5/16" 1 ML MISC  12/15/20   [provider]  DULoxetine (CYMBALTA) 30 MG capsule Take 1 capsule (30 mg total) by mouth daily. Patient not taking: Reported on 03/27/2021 07/19/20   Carollee Herter, Kendrick Fries R, DO  furosemide (LASIX) 20 MG tablet TAKE 1 TABLET(20 MG) BY MOUTH DAILY 12/21/20   Carollee Herter, Alferd Apa, DO  furosemide (LASIX) 40 MG tablet Take by mouth. Patient not taking: Reported on 03/27/2021 02/08/21   [provider]  gabapentin (NEURONTIN) 300 MG capsule Take 600 mg by mouth 3 (three) times daily. 02/16/21   [provider]  hydrALAZINE (APRESOLINE) 50 MG tablet Take 1 tablet (50 mg total) by mouth every 8 (eight) hours. 01/26/21   [name], DO  ibuprofen (ADVIL) 200 MG tablet Take 200 mg by mouth every 6 (six) hours as needed.    [provider]  insulin aspart (NOVOLOG FLEXPEN) 100 UNIT/ML FlexPen Inject 12 Units into the skin 3 (three) times daily with meals. 01/03/21   [name], Melanie Crazier, MD  insulin degludec (TRESIBA FLEXTOUCH) 200 UNIT/ML FlexTouch Pen Inject 46 Units into the skin daily. 01/03/21    [name], Melanie Crazier, MD  Insulin Pen Needle 31G X 5 MM MISC 1 Device by Does not apply route in the morning, at noon, in the evening, and at bedtime. 01/03/21   [name], Melanie Crazier, MD  losartan (COZAAR) 100 MG tablet Take 1 tablet (100 mg total) by mouth daily. 01/26/21   [name], DO  meclizine (ANTIVERT) 25 MG tablet Take 1 tablet (25 mg total) by mouth 3 (three) times daily as needed for dizziness. 02/18/21   Molpus, John, MD  meloxicam (MOBIC) 15 MG tablet Take 1 tablet (15 mg total) by mouth daily. 03/14/21   Kirsteins, Luanna Salk, MD  Menthol, Topical Analgesic, (ICY HOT EX) Apply topically as needed.    [provider]  metaxalone (SKELAXIN) 400 MG  tablet Take 1 tablet (400 mg total) by mouth 3 (three) times daily as needed. 03/02/21   [name], DO  metFORMIN (GLUCOPHAGE-XR) 500 MG 24 hr tablet Take 2 tablets (1,000 mg total) by mouth in the morning and at bedtime. 01/26/21   [name], DO  pravastatin (PRAVACHOL) 40 MG tablet Take 1 tablet (40 mg total) by mouth daily. 01/26/21   [name], DO  pregabalin (LYRICA) 75 MG capsule Take 1 capsule (75 mg total) by mouth 2 (two) times daily. 03/14/21   Kirsteins, Luanna Salk, MD  tamsulosin (FLOMAX) 0.4 MG CAPS capsule Take 1 capsule (0.4 mg total) by mouth daily. 01/26/21   [name], DO  traMADol (ULTRAM) 50 MG tablet 2 po q6 h prn 02/03/21   [name], DO    Allergies    Patient has no known allergies.  Review of Systems   Review of Systems  Constitutional:  Negative for chills and fever.  Eyes:  Negative for visual disturbance.  Respiratory:  Negative for cough, chest tightness, shortness of breath and wheezing.   Cardiovascular:  Positive for leg swelling. Negative for chest pain and palpitations.  Gastrointestinal:  Positive for abdominal pain. Negative for blood in stool, constipation, diarrhea, nausea and vomiting.  Genitourinary:  Positive for dysuria,  flank pain and testicular pain. Negative for difficulty urinating and hematuria.  Neurological:  Negative for dizziness, syncope and headaches.  All other systems reviewed and are negative.  Physical Exam Updated Vital Signs BP (!) 148/85   Pulse 79   Temp 98.4 F (36.9 C) (Oral)   Resp 20   Ht '5\' 11"'$  (1.803 m)   Wt 127 kg   SpO2 98%   BMI 39.05 kg/m   Physical Exam Vitals and nursing note reviewed. Exam conducted with a chaperone present.  Constitutional:      General: He is not in acute distress.    Appearance: Normal appearance. He is obese. He is not ill-appearing, toxic-appearing or diaphoretic.  HENT:     Head: Normocephalic and atraumatic.  Eyes:     General: No scleral icterus.       Right eye: No discharge.        Left eye: No discharge.     Conjunctiva/sclera: Conjunctivae normal.  Cardiovascular:     Rate and Rhythm: Normal rate and regular rhythm.     Pulses: Normal pulses.     Heart sounds: Normal heart sounds, S1 normal and S2 normal. No murmur heard.   No friction rub. No gallop.  Pulmonary:     Effort: Pulmonary effort is normal. No respiratory distress.     Breath sounds: Normal breath sounds. No wheezing, rhonchi or rales.  Abdominal:     General: Abdomen is flat. Bowel sounds are normal. There is no distension.     Palpations: Abdomen is soft. There is no pulsatile mass.     Tenderness: There is abdominal tenderness. There is right CVA tenderness. There is no guarding or rebound.  Genitourinary:    Penis: Normal.      Testes: Normal.        Right: Mass, tenderness or swelling not present.        Left: Mass, tenderness or swelling not present.     Prostate: Normal.  Musculoskeletal:        General: No tenderness.     Right lower leg: Edema present.     Left lower leg: Edema present.  Skin:    General: Skin is warm and dry.     Coloration: Skin is not jaundiced.     Findings: No bruising, erythema, lesion or rash.  Neurological:     General: No  focal deficit present.     Mental Status: He is alert and oriented to person, place, and time.  Psychiatric:        Mood and Affect: Mood normal.        Behavior: Behavior normal.    ED Results / Procedures / Treatments   Labs (all labs ordered are listed, but only abnormal results are displayed) Labs Reviewed  URINALYSIS, ROUTINE W REFLEX MICROSCOPIC - Abnormal; Notable for the following components:      Result Value   Glucose, UA 250 (*)    All other components within normal limits  COMPREHENSIVE METABOLIC PANEL - Abnormal; Notable for the following components:   Glucose, Bld 105 (*)    AST 13 (*)    All other components within normal limits  CBC WITH DIFFERENTIAL/PLATELET    EKG None  Radiology CT Renal Stone Study  Result Date: 04/12/2021 CLINICAL DATA:  Flank pain, groin pain EXAM: CT ABDOMEN AND PELVIS WITHOUT CONTRAST TECHNIQUE: Multidetector CT imaging of the abdomen and pelvis was performed following the standard protocol without IV contrast. COMPARISON:  CT abdomen/pelvis 01/03/2021 FINDINGS: Lower chest: The lung bases are clear. The imaged heart is unremarkable. Hepatobiliary: There is a punctate calcified granuloma in the right hepatic lobe, unchanged. The liver and gallbladder are otherwise unremarkable. There is no biliary ductal dilatation. Pancreas: Unremarkable. Spleen: Unremarkable. Adrenals/Urinary Tract: The adrenals are unremarkable. There are 2 nonobstructing left renal stones measuring 4 mm in the upper pole and 3 mm in the lower pole, stable to minimally increased in size since 01/03/2021. No right renal stones are seen. There are no focal parenchymal lesions. There is no hydronephrosis or hydroureter. Bilateral perinephric stranding is unchanged, nonspecific. The bladder is unremarkable. Stomach/Bowel: The stomach is unremarkable. There is no evidence of bowel obstruction. There is no abnormal bowel wall thickening or inflammatory change. The appendix is not  definitively identified, but there is no pericecal inflammatory change. Vascular/Lymphatic: The abdominal aorta is nonaneurysmal. A prominent left external iliac chain lymph node measuring up to 1.3 cm in short axis is unchanged. There is no new or progressive lymphadenopathy in the abdomen or pelvis. Reproductive: The prostate and seminal vesicles are unremarkable. Other: There is trace free fluid in the presacral space. There is no upper abdominal ascites. There is no free intraperitoneal air. There is a tiny fat containing umbilical hernia. Musculoskeletal: There is a sclerotic lesion in the L3 vertebral body, unchanged and likely benign. There is no acute osseous abnormality. IMPRESSION: 1. Two nonobstructing left renal stones measuring up to 4 mm. No hydronephrosis or hydroureter. 2. Trace free fluid in the presacral space, nonspecific. 3. Otherwise, no acute findings in the abdomen or pelvis. Electronically Signed   By: Valetta Mole M.D.   On: 04/12/2021 11:21    Procedures Procedures   Medications Ordered in ED Medications  ketorolac (TORADOL) 15 MG/ML injection 15 mg (15 mg Intravenous Given 04/12/21 1051)    ED Course  I have reviewed the triage vital signs and the nursing notes.  Pertinent labs & imaging results that were available during my care of the patient were reviewed by me and considered in my medical decision making (see chart for details).    MDM Rules/Calculators/A&P  This is a 60 y.o. male who presents to the ED with complaints of right flank pain that extends to groin and dysuria. Vital signs are hemodynamically stable. He is afebrile. Physical exam is notable for some mild tenderness of right flank and right testicle. Testicular exam is completely normal with no evidence of torsion.  No masses felt. No other abnormalities.    No leukocytosis or anemia noted on labs. UA is not suggestive of UTI or pyelo. CT stone study shows 2 nonobstructive left  renal stones measuring 4 mm in the upper pole and 3 mm in the lower pole. These findings would not cause patient's symptoms. There is no evidence of hydronephrosis, appendicitis, or AAA.   Patient deemed to not have an emergent cause of flank or groin pain. He is instructed to follow up with his PCP in 1 week for further workup.     Final Clinical Impression(s) / ED Diagnoses Final diagnoses:  Groin pain, right    Rx / DC Orders ED Discharge Orders     None        Adolphus Birchwood, PA-C 04/12/21 1343    Lucrezia Starch, MD 04/13/21 [phone]

## 2021-04-12 NOTE — ED Notes (Signed)
ED Provider at bedside. 

## 2021-04-12 NOTE — Discharge Instructions (Addendum)
You have been seen in the ED for right groin pain. Your CT scan was negative for any serious pathology. Please return if you develop worsening symptoms.  Please see your PCP for further workup. Thank you for allowing [place] to be a part of your care.

## 2021-04-12 NOTE — ED Notes (Signed)
Per EDP order, pt given fluids and/or food for PO challenge. Pt verbalized understanding to utilize call bell if nausea or emesis occur. 

## 2021-04-13 ENCOUNTER — Ambulatory Visit: Payer: Medicare HMO | Admitting: *Deleted

## 2021-04-13 ENCOUNTER — Encounter: Payer: Self-pay | Admitting: *Deleted

## 2021-04-13 DIAGNOSIS — I10 Essential (primary) hypertension: Secondary | ICD-10-CM

## 2021-04-13 DIAGNOSIS — R2689 Other abnormalities of gait and mobility: Secondary | ICD-10-CM

## 2021-04-13 DIAGNOSIS — G8929 Other chronic pain: Secondary | ICD-10-CM

## 2021-04-13 DIAGNOSIS — R251 Tremor, unspecified: Secondary | ICD-10-CM

## 2021-04-13 DIAGNOSIS — R0600 Dyspnea, unspecified: Secondary | ICD-10-CM

## 2021-04-13 DIAGNOSIS — M545 Low back pain, unspecified: Secondary | ICD-10-CM

## 2021-04-13 DIAGNOSIS — R0609 Other forms of dyspnea: Secondary | ICD-10-CM

## 2021-04-13 DIAGNOSIS — E1140 Type 2 diabetes mellitus with diabetic neuropathy, unspecified: Secondary | ICD-10-CM

## 2021-04-13 DIAGNOSIS — IMO0002 Reserved for concepts with insufficient information to code with codable children: Secondary | ICD-10-CM

## 2021-04-13 NOTE — Chronic Care Management (AMB) (Signed)
Chronic Care Management    Clinical Social Work Note  04/13/2021 Name: Tyler Brewer MRN: [id number] DOB: Aug 18, 1961  Tyler Brewer is a 60 y.o. year old male who is a primary care patient of Tyler Held, DO. The CCM team was consulted to assist the patient with chronic disease management and/or care coordination needs related to: Level of Care Concerns.   Engaged with patient by telephone for follow-up visit in response to provider referral for social work chronic care management and care coordination services.   Consent to Services:  The patient was given information about Chronic Care Management services, agreed to services, and gave verbal consent prior to initiation of services.  Please see initial visit note for detailed documentation.   Patient agreed to services and consent obtained.   Assessment: Review of patient past medical history, allergies, medications, and health status, including review of relevant consultants reports was performed today as part of a comprehensive evaluation and provision of chronic care management and care coordination services.     SDOH (Social Determinants of Health) assessments and interventions performed:    Advanced Directives Status: Not addressed in this encounter.  CCM Care Plan  No Known Allergies  Outpatient Encounter Medications as of 04/13/2021  Medication Sig Note   acetaminophen (TYLENOL) 500 MG tablet Take 500 mg by mouth every 6 (six) hours as needed. States he is taking one to two tablets a day    amLODipine (NORVASC) 10 MG tablet Take 1 tablet (10 mg total) by mouth daily.    COMFORT EZ INSULIN SYRINGE 31G X 5/16" 1 ML MISC     DULoxetine (CYMBALTA) 30 MG capsule Take 1 capsule (30 mg total) by mouth daily. (Patient not taking: Reported on 03/27/2021)    furosemide (LASIX) 20 MG tablet TAKE 1 TABLET(20 MG) BY MOUTH DAILY 03/17/2021: Reports takes '20mg'$  two tablets every morning   furosemide (LASIX) 40 MG tablet Take by  mouth. (Patient not taking: Reported on 03/27/2021)    gabapentin (NEURONTIN) 300 MG capsule Take 600 mg by mouth 3 (three) times daily.    hydrALAZINE (APRESOLINE) 50 MG tablet Take 1 tablet (50 mg total) by mouth every 8 (eight) hours.    ibuprofen (ADVIL) 200 MG tablet Take 200 mg by mouth every 6 (six) hours as needed.    insulin aspart (NOVOLOG FLEXPEN) 100 UNIT/ML FlexPen Inject 12 Units into the skin 3 (three) times daily with meals. 03/17/2021: Reports takes 12 units tid with meals   insulin degludec (TRESIBA FLEXTOUCH) 200 UNIT/ML FlexTouch Pen Inject 46 Units into the skin daily.    Insulin Pen Needle 31G X 5 MM MISC 1 Device by Does not apply route in the morning, at noon, in the evening, and at bedtime.    losartan (COZAAR) 100 MG tablet Take 1 tablet (100 mg total) by mouth daily.    meclizine (ANTIVERT) 25 MG tablet Take 1 tablet (25 mg total) by mouth 3 (three) times daily as needed for dizziness. 03/27/2021: As needed   meloxicam (MOBIC) 15 MG tablet Take 1 tablet (15 mg total) by mouth daily.    Menthol, Topical Analgesic, (ICY HOT EX) Apply topically as needed.    metaxalone (SKELAXIN) 400 MG tablet Take 1 tablet (400 mg total) by mouth 3 (three) times daily as needed.    metFORMIN (GLUCOPHAGE-XR) 500 MG 24 hr tablet Take 2 tablets (1,000 mg total) by mouth in the morning and at bedtime.    pravastatin (PRAVACHOL) 40 MG tablet Take 1  tablet (40 mg total) by mouth daily.    pregabalin (LYRICA) 75 MG capsule Take 1 capsule (75 mg total) by mouth 2 (two) times daily.    tamsulosin (FLOMAX) 0.4 MG CAPS capsule Take 1 capsule (0.4 mg total) by mouth daily.    traMADol (ULTRAM) 50 MG tablet 2 po q6 h prn 02/16/2021: Prescribed by [name], DO. LD 02/16/21   No facility-administered encounter medications on file as of 04/13/2021.    Patient Active Problem List   Diagnosis Date Noted   CIDP (chronic inflammatory demyelinating polyneuropathy) ([place]) 01/12/2021   Diabetes mellitus  ([place]) 01/03/2021   Type 2 diabetes mellitus with hyperglycemia, with long-term current use of insulin ([place]) 01/03/2021   Lower extremity edema 11/28/2020   Chronic low back pain 11/28/2020   Psoriatic arthritis ([place]) 11/28/2020   Thoracic aortic aneurysm without rupture ([place]) 11/28/2020   Bilateral leg weakness 11/05/2020   Acute left-sided low back pain with right-sided sciatica 07/19/2020   Hip pain, acute, left 07/05/2020   Tremor 07/05/2020   Weakness 07/05/2020   Balance problem 03/11/2020   Tinea cruris 03/11/2020   Cellulitis of left groin 03/11/2020   Acute pain of right shoulder 03/11/2020   Interstitial lung disease ([place]) 05/02/2017   Pansinusitis 09/26/2016   Diabetic polyneuropathy associated with diabetes mellitus due to underlying condition ([place]) 05/08/2016   Methamphetamine use disorder, severe, dependence ([place]) 02/11/2016   Substance induced mood disorder ([place]) 02/11/2016   Exertional dyspnea 11/30/2015   ADD (attention deficit disorder) 09/29/2015   Binge eating 09/29/2015   Syncope 05/05/2012   Diarrhea 05/05/2012   Panic attacks 05/05/2012   OSA (obstructive sleep apnea) 05/05/2012   HTN (hypertension) 05/05/2012   Dyslipidemia 05/05/2012   CAD (coronary artery disease)  cardiac cath with moderate disease in a septal branch of the ramus intermedius 04/01/2012   Chest pain 04/01/2012   Drug abuse and dependence ([place]) 04/01/2012   Family history of coronary artery disease 03/31/2012   Sleep apnea, on C-pap 03/31/2012   Hyperlipemia 03/30/2012   HTN (hypertension), benign 03/30/2012   Morbid obesity ([place]) 03/30/2012   DM type 2, uncontrolled, with neuropathy ([place]) 03/30/2012   Depression with suicidal ideation 03/30/2012    Conditions to be addressed/monitored: CAD, HTN, and DMII.  Level of Care Concerns, ADL/IADL Limitations, Limited Access to Caregiver, Memory Deficits, and Lacks Knowledge of [employer].  Care Plan : LCSW Plan of Care  Updates  made by Francis Gaines, LCSW since 04/13/2021 12:00 AM     Problem: Receive Long-Term Placement in an [place].   Priority: High     Goal: Receive Long-Term Placement in an [place].   Start Date: 03/30/2021  Expected End Date: 05/30/2021  This Visit's Progress: On track  Recent Progress: On track  Priority: High  Note:   Current Barriers:   Patient with History of Falls/Fall Risk, Unsteady Gait, Impaired Mobility, Weakness, Tremor, Syncope, Balance Problem and Lower Extremity Edema needs assistance with initiating short-term rehabilitative services in a skilled nursing facility. Patient's health has declined, he is experiencing memory deficits, and he is unable to consistently perform ADL's/IADL's. Clinical Goal(s):  Patient will receive assistance with initiating long-term placement in an assisted living facility. Interventions: Collaboration with Primary Care Physician, Dr. [name] regarding development and update of comprehensive plan of care as evidenced by provider attestation and co-signature. Inter-disciplinary care team collaboration (see longitudinal plan of care). Assessed needs and provided education on level of care and  facility placement process. Encouraged patient to apply for [place] Medicaid, through the [place], mailing application to his home on 04/13/2021. Encouraged patient to review list of long-term care assisted living facilities in [facility], mailed to his home on 04/13/2021, deciding on at least 4 facilities of interest. Discussed plans with patient for ongoing care management follow-up and provided patient with direct contact information for care management team. Patient Goals/Self-Care Activities: Complete application for [place] Medicaid, notifying LCSW directly (# [phone]) if you need assistance with application  completion and/or submission. Review list of long-term care assisted living facilities and be prepared to provide LCSW with at least 4 facilities of interest, during next scheduled telephone outreach call. Follow-Up:  04/21/2021 at 11:30am      Follow-Up Plan:  04/21/2021 at 11:30am  [place] Clinical Social Worker [place] [phone]

## 2021-04-13 NOTE — Patient Instructions (Signed)
Visit Information  PATIENT GOALS:  Goals Addressed               This Visit's Progress     Receive Long-Term Placement in an [place]. (pt-stated)   On track     Timeframe:  Short-Range Goal Priority:  High Start Date:  03/30/2021                        Expected End Date: 05/30/2021  Follow-Up Plan:  04/21/2021 at 11:30am  Patient Goals/Self-Care Activities: Complete application for [place] Medicaid, notifying LCSW directly (# [phone]) if you need assistance with application completion and/or submission. Review list of long-term care assisted living facilities and be prepared to provide LCSW with at least 4 facilities of interest, during next scheduled telephone outreach call.        Patient verbalizes understanding of instructions provided today and agrees to view in [place].   Telephone follow-up appointment with care management team member scheduled for:  04/21/2021 at 11:30am  [place] Licensed Clinical Social Worker [place] [phone]

## 2021-04-17 ENCOUNTER — Telehealth: Payer: Medicare HMO

## 2021-04-17 ENCOUNTER — Encounter: Payer: Self-pay | Admitting: Internal Medicine

## 2021-04-17 ENCOUNTER — Telehealth: Payer: Self-pay

## 2021-04-17 NOTE — Telephone Encounter (Signed)
  Care Management   Follow Up Note   04/17/2021 Name: Tyler Brewer MRN: [id number] DOB: [birth year]   Referred by: [name], DO Reason for referral : Chronic Care Management   An unsuccessful telephone outreach was attempted today. The patient was referred to the case management team for assistance with care management and care coordination.   Follow Up Plan: The care management team will reach out to the patient again over the next 30 days.   Thea Silversmith, RN, MSN, BSN, CCM Care Management Coordinator [facility] [phone]

## 2021-04-18 ENCOUNTER — Other Ambulatory Visit: Payer: Self-pay | Admitting: Family Medicine

## 2021-04-18 DIAGNOSIS — I10 Essential (primary) hypertension: Secondary | ICD-10-CM

## 2021-04-18 DIAGNOSIS — N138 Other obstructive and reflux uropathy: Secondary | ICD-10-CM

## 2021-04-19 ENCOUNTER — Ambulatory Visit: Payer: Medicare HMO | Admitting: Pharmacist

## 2021-04-19 DIAGNOSIS — I10 Essential (primary) hypertension: Secondary | ICD-10-CM

## 2021-04-19 DIAGNOSIS — Z6841 Body Mass Index (BMI) 40.0 and over, adult: Secondary | ICD-10-CM | POA: Diagnosis not present

## 2021-04-19 DIAGNOSIS — E1121 Type 2 diabetes mellitus with diabetic nephropathy: Secondary | ICD-10-CM | POA: Diagnosis not present

## 2021-04-19 DIAGNOSIS — Z87891 Personal history of nicotine dependence: Secondary | ICD-10-CM | POA: Diagnosis not present

## 2021-04-19 DIAGNOSIS — N138 Other obstructive and reflux uropathy: Secondary | ICD-10-CM

## 2021-04-19 DIAGNOSIS — N401 Enlarged prostate with lower urinary tract symptoms: Secondary | ICD-10-CM | POA: Diagnosis not present

## 2021-04-19 DIAGNOSIS — Z9181 History of falling: Secondary | ICD-10-CM | POA: Diagnosis not present

## 2021-04-19 DIAGNOSIS — I2510 Atherosclerotic heart disease of native coronary artery without angina pectoris: Secondary | ICD-10-CM | POA: Diagnosis not present

## 2021-04-19 DIAGNOSIS — K589 Irritable bowel syndrome without diarrhea: Secondary | ICD-10-CM | POA: Diagnosis not present

## 2021-04-19 DIAGNOSIS — Z794 Long term (current) use of insulin: Secondary | ICD-10-CM | POA: Diagnosis not present

## 2021-04-19 DIAGNOSIS — E1142 Type 2 diabetes mellitus with diabetic polyneuropathy: Secondary | ICD-10-CM | POA: Diagnosis not present

## 2021-04-19 DIAGNOSIS — N39498 Other specified urinary incontinence: Secondary | ICD-10-CM | POA: Diagnosis not present

## 2021-04-19 DIAGNOSIS — F410 Panic disorder [episodic paroxysmal anxiety] without agoraphobia: Secondary | ICD-10-CM | POA: Diagnosis not present

## 2021-04-19 DIAGNOSIS — J849 Interstitial pulmonary disease, unspecified: Secondary | ICD-10-CM | POA: Diagnosis not present

## 2021-04-19 DIAGNOSIS — E785 Hyperlipidemia, unspecified: Secondary | ICD-10-CM

## 2021-04-19 DIAGNOSIS — E1140 Type 2 diabetes mellitus with diabetic neuropathy, unspecified: Secondary | ICD-10-CM

## 2021-04-19 DIAGNOSIS — G8929 Other chronic pain: Secondary | ICD-10-CM | POA: Diagnosis not present

## 2021-04-19 DIAGNOSIS — F32A Depression, unspecified: Secondary | ICD-10-CM | POA: Diagnosis not present

## 2021-04-19 DIAGNOSIS — E1165 Type 2 diabetes mellitus with hyperglycemia: Secondary | ICD-10-CM | POA: Diagnosis not present

## 2021-04-19 DIAGNOSIS — M1990 Unspecified osteoarthritis, unspecified site: Secondary | ICD-10-CM | POA: Diagnosis not present

## 2021-04-19 DIAGNOSIS — F988 Other specified behavioral and emotional disorders with onset usually occurring in childhood and adolescence: Secondary | ICD-10-CM | POA: Diagnosis not present

## 2021-04-19 DIAGNOSIS — M5441 Lumbago with sciatica, right side: Secondary | ICD-10-CM | POA: Diagnosis not present

## 2021-04-19 DIAGNOSIS — E1136 Type 2 diabetes mellitus with diabetic cataract: Secondary | ICD-10-CM | POA: Diagnosis not present

## 2021-04-19 DIAGNOSIS — E7800 Pure hypercholesterolemia, unspecified: Secondary | ICD-10-CM | POA: Diagnosis not present

## 2021-04-19 DIAGNOSIS — IMO0002 Reserved for concepts with insufficient information to code with codable children: Secondary | ICD-10-CM

## 2021-04-19 DIAGNOSIS — R2689 Other abnormalities of gait and mobility: Secondary | ICD-10-CM

## 2021-04-19 DIAGNOSIS — G4733 Obstructive sleep apnea (adult) (pediatric): Secondary | ICD-10-CM | POA: Diagnosis not present

## 2021-04-19 NOTE — Chronic Care Management (AMB) (Signed)
Chronic Care Management Pharmacy Note  04/20/2021 Name:  Tyler Brewer MRN:  [id number] DOB:  1961/07/17  Subjective: Tyler Brewer is an 60 y.o. year old male who is a primary patient of [name], DO.  The CCM team was consulted for assistance with disease management and care coordination needs.    Engaged with patient by telephone for  assistance with refilling medication  in response to provider referral for pharmacy case management and/or care coordination services.  Mr. Deruiter has difficulty with transportation and also ambulation. He requested all prescriptions to be filled either at [facility] or through [employer] order. Patient reached out to clinical pharmacist because when he tried to order prescriptions from [facility], he was told they were at his local pharmacy. He was frustrated and asked for assistance.  See Care notes below for more information.   Consent to Services:  The patient was given information about Chronic Care Management services, agreed to services, and gave verbal consent prior to initiation of services.  Please see initial visit note for detailed documentation.   Patient Care Team: Carollee Herter, Alferd Apa, DO as PCP - General (Family Medicine) Alda Berthold, DO as Consulting Physician (Neurology) Lajuana Matte, MD as Consulting Physician (Cardiothoracic Surgery) Marlaine Hind, MD as Consulting Physician (Physical Medicine and Rehabilitation) Luretha Rued, RN as Case Manager Cherre Robins, PharmD (Pharmacist) [facility], Melanie Crazier, MD as Consulting Physician (Endocrinology) Saporito, Maree Erie, LCSW as Social Worker (Licensed Clinical Social Worker)  Recent office visits: 01/12/2021 - PCP (Dr Etter Sjogren) F/U visit; recommended COVID vaccine and colonoscopy; no medication changes.  11/28/2020 - PCP (Dr Etter Sjogren) Hospital f/u; c/o LEE; Refilled furosemide 61m take 1 tablet daily and tramadol 521m1 or 2 tablet every 8 hours.  Referred to Endo for DM.   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 No medication changes.   03/24/2021 - Neurology ([facility]. Repeat EMG/NCS to assess for diabetic amyotrophy versus demyelinating process. Labs checked. No med changes. F/U 3 months.   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 No medication changes. Recommended optimize control of DM and physical therapy to improve strength.   01/03/2021 - Endo (Dr ShKelton PillarInitial consult for insulin dependent type 2 DM. Started Metformin 1 tablet daily with Breakfast for 1 week, then increase to 1 tablet with Breakfast and 1 tablet with Supper for  1 week, then increase to 2 tablets with Breakfast  and 1 tablet with Supper for another 1 week , then finally 2 Tablets with Breakfast and 2 tablets with Supper.  Increased Tresiba to 45 units daily. Increase Novolog to 12 units with each meal     Hospital visits 04/01/2021 - [facility]. Noted also to have urinary retention. MRI showed no changes or spinal cord compression. GIven tramadol, lorazepam and meloxicam in hospital. Patient was noted to be upset at dicharge because he wanted to be admitted. 03/16/2021 - ED Visit - for neuropathy - no med changes.  01/23/2021 - ED Visit - acute pain in left knee given oxycodone / APAP for 1 dose and IM toradol 01/21/2021 - Ed Visit - left without being seen (MGershon Musselone) 01/20/2021 - ED Visit - abdominal pain; GIven GI cocktail with improved symptoms. No change to medication regimen 01/16/2021 - ED Visit -  leg pain; Given ketorolac 8m IM; oxycodone 137mx 1 dose; prednisone 6079m 1 dose and Rx to start methylprednisolone dose pack   Objective:  Lab Results  Component Value Date   CREATININE 0.79 04/12/2021    CREATININE 0.82 03/16/2021   CREATININE 0.90 02/14/2021    Lab Results  Component Value Date   HGBA1C 7.7 (H) 02/14/2021   Last diabetic Eye exam: No results found for: HMDIABEYEEXA  Last diabetic Foot exam: No results found for: HMDIABFOOTEX      Component Value Date/Time   CHOL 136 02/14/2021 1419   TRIG 102.0 02/14/2021 1419   HDL 55.50 02/14/2021 1419   CHOLHDL 2 02/14/2021 1419   VLDL 20.4 02/14/2021 1419   LDLCALC 60 02/14/2021 1419   LDLDIRECT 124.0 05/02/2017 1507    Hepatic Function Latest Ref Rng & Units 04/12/2021 03/16/2021 02/14/2021  Total Protein 6.5 - 8.1 g/dL 7.0 6.8 6.4  Albumin 3.5 - 5.0 g/dL 3.8 3.8 4.1  AST 15 - 41 U/L 13(L) 22 12  ALT 0 - 44 U/L _0 Alk Phosphatase 38 - 126 U/L 74 76 60  Total Bilirubin 0.3 - 1.2 mg/dL 0.3 0.5 0.4    Lab Results  Component Value Date/Time   TSH 2.57 02/14/2021 02:19 PM   TSH 2.948 11/05/2020 07:18 AM   TSH 2.66 03/10/2020 10:06 AM    CBC Latest Ref Rng & Units 04/12/2021 03/16/2021 02/14/2021  WBC 4.0 - 10.5 K/uL 6.3 6.0 6.8  Hemoglobin 13.0 - 17.0 g/dL 13.9 13.4 12.9(L)  Hematocrit 39.0 - 52.0 % 40.6 39.0 38.4(L)  Platelets 150 - 400 K/uL 276 243 289.0    No results found for: VD25OH  Clinical ASCVD: Yes  The 10-year ASCVD risk score (GoMikey Bussing Jr., et al., 2013) is: 19.7%   Values used to calculate the score:     Age: [age]     Sex: Male     Is Non-Hispanic African American: No     Diabetic: Yes     Tobacco smoker: Yes     Systolic Blood Pressure: 148245Hg     Is BP treated: Yes     HDL Cholesterol: 55.5 mg/dL     Total Cholesterol: 136 mg/dL     Social History   Tobacco Use  Smoking Status Every Day   Packs/day: 0.50   Types: Cigarettes   Last attempt to quit: 09/29/2020   Years since quitting: 0.5  Smokeless Tobacco Never   BP Readings from Last 3 Encounters:  04/12/21 (!) 148/85  03/21/21 114/72  03/16/21 (!) 176/99   Pulse Readings from Last 3 Encounters:  04/12/21 79  03/21/21  100  03/16/21 80   Wt Readings from Last 3 Encounters:  04/12/21 280 lb (127 kg)  03/21/21 280 lb (127 kg)  03/16/21 280 lb (127 kg)    Assessment: Review of patient past medical history, allergies, medications, health status, including review of consultants reports, laboratory and other test data, was performed as part of comprehensive evaluation and provision of chronic care management services.   SDOH:  (Social Determinants of Health) assessments and interventions performed:    CCM Care Plan  No Known Allergies  Medications Reviewed Today     Reviewed by SapFrancis GainesCSW (Social Worker) on 04/13/21 at 1310  Med List Status: <None>   Medication Order Taking? Sig Documenting Provider Last Dose Status Informant  acetaminophen (TYLENOL) 500 MG tablet [id number] Take 500 mg by mouth every 6 (  six) hours as needed. States he is taking one to two tablets a day [provider] Taking Active Self  amLODipine (NORVASC) 10 MG tablet [id number] No Take 1 tablet (10 mg total) by mouth daily. [name] R, DO Taking Active   COMFORT EZ INSULIN SYRINGE 31G X 5/16" 1 ML MISC [id number] No  [provider] Taking Active   DULoxetine (CYMBALTA) 30 MG capsule [id number] No Take 1 capsule (30 mg total) by mouth daily.  Patient not taking: Reported on 03/27/2021   [name], DO Not Taking Active   furosemide (LASIX) 20 MG tablet [id number] No TAKE 1 TABLET(20 MG) BY MOUTH DAILY [name], DO Taking Active            Med Note Juleen China, Denton Brick M   Fri Mar 17, 2021  2:21 PM) Reports takes 3m two tablets every morning  furosemide (LASIX) 40 MG tablet [id number] Take by mouth.  Patient not taking: Reported on 03/27/2021   [provider] Not Taking Active   gabapentin (NEURONTIN) 300 MG capsule [id number] Take 600 mg by mouth 3 (three) times daily. [provider] Taking Active   hydrALAZINE (APRESOLINE) 50 MG tablet [id number]  Take 1 tablet (50 mg total) by mouth every 8 (eight) hours. LRoma SchanzR, DO Taking Active   ibuprofen (ADVIL) 200 MG tablet [id number] Take 200 mg by mouth every 6 (six) hours as needed. [provider] Taking Active   insulin aspart (NOVOLOG FLEXPEN) 100 UNIT/ML FlexPen [id number] Inject 12 Units into the skin 3 (three) times daily with meals. [name], IMelanie Crazier MD Taking Active            Med Note (Juleen China JDeno Etienne  Fri Mar 17, 2021  2:25 PM) Reports takes 12 units tid with meals  insulin degludec (TRESIBA FLEXTOUCH) 200 UNIT/ML FlexTouch Pen [id number] Inject 46 Units into the skin daily. [name], IMelanie Crazier MD Taking Active   Insulin Pen Needle 31G X 5 MM MISC [id number] 1 Device by Does not apply route in the morning, at noon, in the evening, and at bedtime. [name], IMelanie Crazier MD Taking Active   losartan (COZAAR) 100 MG tablet [id number] Take 1 tablet (100 mg total) by mouth daily. LAnn Held DO Taking Active   meclizine (ANTIVERT) 25 MG tablet [id number] Take 1 tablet (25 mg total) by mouth 3 (three) times daily as needed for dizziness. Molpus, John, MD Taking Active            Med Note (Lynelle DoctorAug 8, 2022  1:24 PM) As needed  meloxicam (MOBIC) 15 MG tablet [id number] Take 1 tablet (15 mg total) by mouth daily. Kirsteins, ALuanna Salk MD Taking Active   Menthol, Topical Analgesic, (ICY HOT EX) [id number] Apply topically as needed. [provider] Taking Active   metaxalone (SKELAXIN) 400 MG tablet [id number] Take 1 tablet (400 mg total) by mouth 3 (three) times daily as needed. LAnn Held DO Taking Active   metFORMIN (GLUCOPHAGE-XR) 500 MG 24 hr tablet [id number] Take 2 tablets (1,000 mg total) by mouth in the morning and at bedtime. LAnn Held DO Taking Active   pravastatin (PRAVACHOL) 40 MG tablet [id number] Take 1 tablet (40 mg total) by mouth daily. LRoma SchanzR, DO Taking Active   pregabalin (LYRICA) 75 MG capsule [id number] Take  1 capsule (75 mg total) by mouth 2 (two) times daily. Charlett Blake, MD Taking Active   tamsulosin [facility]) 0.4 MG CAPS capsule [id number] No Take 1 capsule (0.4 mg total) by mouth daily. [name], DO Taking Active   traMADol (ULTRAM) 50 MG tablet [id number] No 2 po q6 h prn [name], DO Taking Active            Med Note Scheryl Darter Feb 16, 2021  2:00 PM) Prescribed by [name], DO. LD 02/16/21            Patient Active Problem List   Diagnosis Date Noted   CIDP (chronic inflammatory demyelinating polyneuropathy) ([place]) 01/12/2021   Diabetes mellitus ([place]) 01/03/2021   Type 2 diabetes mellitus with hyperglycemia, with long-term current use of insulin ([place]) 01/03/2021   Lower extremity edema 11/28/2020   Chronic low back pain 11/28/2020   Psoriatic arthritis ([place]) 11/28/2020   Thoracic aortic aneurysm without rupture ([place]) 11/28/2020   Bilateral leg weakness 11/05/2020   Acute left-sided low back pain with right-sided sciatica 07/19/2020   Hip pain, acute, left 07/05/2020   Tremor 07/05/2020   Weakness 07/05/2020   Balance problem 03/11/2020   Tinea cruris 03/11/2020   Cellulitis of left groin 03/11/2020   Acute pain of right shoulder 03/11/2020   Interstitial lung disease ([place]) 05/02/2017   Pansinusitis 09/26/2016   Diabetic polyneuropathy associated with diabetes mellitus due to underlying condition ([place]) 05/08/2016   Methamphetamine use disorder, severe, dependence ([place]) 02/11/2016   Substance induced mood disorder ([place]) 02/11/2016   Exertional dyspnea 11/30/2015   ADD (attention deficit disorder) 09/29/2015   Binge eating 09/29/2015   Syncope 05/05/2012   Diarrhea 05/05/2012   Panic attacks 05/05/2012   OSA (obstructive sleep apnea) 05/05/2012   HTN (hypertension) 05/05/2012   Dyslipidemia 05/05/2012   CAD (coronary artery disease)  cardiac  cath with moderate disease in a septal branch of the ramus intermedius 04/01/2012   Chest pain 04/01/2012   Drug abuse and dependence ([place]) 04/01/2012   Family history of coronary artery disease 03/31/2012   Sleep apnea, on C-pap 03/31/2012   Hyperlipemia 03/30/2012   HTN (hypertension), benign 03/30/2012   Morbid obesity ([place]) 03/30/2012   DM type 2, uncontrolled, with neuropathy ([place]) 03/30/2012   Depression with suicidal ideation 03/30/2012    Immunization History  Administered Date(s) Administered   Tdap 08/20/2014    Conditions to be addressed/monitored: CAD, HTN, HLD, DMII and chronic pain (low back) interstitial lung disease; OSA  Care Plan : General Pharmacy (Adult)  Updates made by Cherre Robins, PHARMD since 04/20/2021 12:00 AM     Problem: Medication management and Chronic Care Management, support and education for the following conditions - HTN, Type2 DM with long term insuiln use; hyperlipidemia; neuropathy; OSA; institial lung disease;   Priority: High  Onset Date: 01/06/2021  Note:   Current Barriers:  Unable to independently afford treatment regimen Unable to independently monitor therapeutic efficacy Unable to achieve control of type 2 DM and HTN - improved Does not adhere to prescribed medication regimen Frequent ED visits  Pharmacist Clinical Goal(s):  Over the next 45 days, patient will verbalize ability to afford treatment regimen achieve adherence to monitoring guidelines and medication adherence to achieve therapeutic efficacy achieve control of type 2 DM and HTN as evidenced by attainment of goals listed below Decrease frequency of ED visits  through collaboration with PharmD and provider.   Interventions: 1:1  collaboration with Carollee Herter, Alferd Apa, DO regarding development and update of comprehensive plan of care as evidenced by provider attestation and co-signature Inter-disciplinary care team collaboration (see longitudinal plan of  care) Comprehensive medication review performed; medication list updated in electronic medical record   Interventions: 1:1 collaboration with Carollee Herter, Alferd Apa, DO regarding development and update of comprehensive plan of care as evidenced by provider attestation and co-signature Inter-disciplinary care team collaboration (see longitudinal plan of care) Comprehensive medication review performed; medication list updated in electronic medical record  Diabetes: Lab Results  Component Value Date   HGBA1C 7.7 (H) 02/14/2021  A1c 03/23/2021 at [facility] was 6.9%  Controlled - (goal A1c <7%) Much improved BG since started using [place] and taking insuiln regularly States cost [place] and Novolog was barrier. Dr [name] changed to regimen on 04/17/2021 to regimen below with [employer] / [employer] insulin that is less expensive.   Current treatment: Relion NPH insulin- inject 35 units at bedtime Relion Regular insulin - inject 10 units prior to each meal.  Metformin XR 555m - 2 Tablets with Breakfast and 2 tablets with Supper (refill past due)  Interventions / Recommendations Recommended continue to take medications as directed by Dr SKelton PillarContinue to check BG [facility] Reminded patient to complete [employer]patient assistance application and return to office.    Patient was denied LIS but he forgot to bring to letter to office. Reminded to bring with PAP application.  Hypertension: BP Readings from Last 3 Encounters:  04/12/21 (!) 148/85  03/21/21 114/72  03/16/21 (!) 176/99  BP improved but still not consistently at goal - BP goal <140/90 Not checking BP at home regularly Current treatment: Amlodipine 152mdaily  Hydralazine 5010mvery 8 hours Losartan 100m31mily  Interventions:  Counseled on importance of getting blood pressure to goal to prevent strokes and kidney damage Patient has been out of hydralazine - coordinated refills with  [facility]  Hyperlipidemia / Heart Disease: Lipid Panel     Component Value Date/Time   CHOL 118 11/28/2020 1502   TRIG 130.0 11/28/2020 1502   HDL 40.00 11/28/2020 1502   CHOLHDL 3 11/28/2020 1502   VLDL 26.0 11/28/2020 1502   LDLCALC 52 11/28/2020 1502  Controlled - LDL goal <70 Current treatment: Pravastatin 40mg28mbedtime Interventions:  Counseled on LDL goals Recommended continue current regimen for cholesterol  Chronic Pain / Leg Weakness: Currently not controlled Patient has had several ED visits for pain and weakness in the last month. Has appointment with pain management 02/16/2021. Current treatment:  Tramadol 50mg 6mke 1 or 2 tablets up to every 8 hours as needed Methocarbamol 500mg -20me 1 tablet 4 times a day as needed for muscle spasms Voltaren Gel 1% -  apply to area of pain up to 4 times a day as needed Prednisone  Interventions:  Continue current regimen for pain  Encouraged patient to follow up with neurology Ensure transportation has been set up for pain management appointment 02/16/2021  Continue with plan for testing by neurology  Medication Management:  Refill history shows that patient has not refilled hydralazine, metformin on time.  Patient also contact clinical pharmacist asking for [facility] He tried to order medications but was told that he had meds ready to pick up at his local pharmacy.  [facility] had auto filled the following meds - Novolog, Tresiba, amlodipine, duloxetine, tamsulosin and losartan Interventions:  Coordinated with [facility]  to reverse meds filled above.  Coordinated getting amlodipine and hydralazine filled at [facility]. Patient will need to request delivery at [email or web address];  Coordinated refills at [facility] for the following medications: pravastatin, tamsulosin, metformin and losartan. Will continue to follow adherence and assist patient with refills.  Reminded patient that he needs  to answer calls for regular checki-ns so that we can order / request refills BEFORE he runs out of medication.   Patient Goals/Self-Care Activities Over the next 90 days, patient will:  take medications as prescribed focus on medication adherence by utilizing pharmacy service that delivers medications to his home, check glucose before meals and 2 hour after meals using CGM, document, and provide at future appointments collaborate with provider on medication access solutions and refills   Follow up call planned In 2 weeks       Medication Assistance: Application for Novolog and Tresiba  medication assistance program. in process.  Anticipated assistance start date 05/09/2021.  See plan of care for additional detail. Today patient reports that he is still completing application for Novo insulins  Over 60 minutes spent coordinating refills for patient and assisting with setting up mail order account with [employer] with patient .  Patient's preferred pharmacy is:  [id number] - [place], [place] - [address] [place] [address] Phone: [phone] Fax: [phone]  [facility] [place] [place] [facility] [place] [address] Phone: [phone] Fax: [phone]   Follow Up:  Patient agrees to Care Plan and Follow-up.  Plan: The care management team will reach out to the patient again in 2 weeks.   Cherre Robins, PharmD Clinical Pharmacist [facility] [phone]

## 2021-04-20 ENCOUNTER — Emergency Department (HOSPITAL_COMMUNITY): Payer: Medicare HMO

## 2021-04-20 ENCOUNTER — Other Ambulatory Visit: Payer: Self-pay

## 2021-04-20 ENCOUNTER — Emergency Department (HOSPITAL_COMMUNITY)
Admission: EM | Admit: 2021-04-20 | Discharge: 2021-04-20 | Disposition: A | Discharge status: Home / Self Care | Payer: Medicare HMO | Attending: Emergency Medicine | Admitting: Emergency Medicine

## 2021-04-20 ENCOUNTER — Encounter (HOSPITAL_COMMUNITY): Payer: Self-pay

## 2021-04-20 ENCOUNTER — Telehealth: Payer: Self-pay | Admitting: Pharmacist

## 2021-04-20 DIAGNOSIS — Z794 Long term (current) use of insulin: Secondary | ICD-10-CM | POA: Diagnosis not present

## 2021-04-20 DIAGNOSIS — N5082 Scrotal pain: Secondary | ICD-10-CM | POA: Diagnosis not present

## 2021-04-20 DIAGNOSIS — I2510 Atherosclerotic heart disease of native coronary artery without angina pectoris: Secondary | ICD-10-CM | POA: Diagnosis not present

## 2021-04-20 DIAGNOSIS — R109 Unspecified abdominal pain: Secondary | ICD-10-CM | POA: Diagnosis not present

## 2021-04-20 DIAGNOSIS — I10 Essential (primary) hypertension: Secondary | ICD-10-CM | POA: Insufficient documentation

## 2021-04-20 DIAGNOSIS — Z79899 Other long term (current) drug therapy: Secondary | ICD-10-CM | POA: Insufficient documentation

## 2021-04-20 DIAGNOSIS — N433 Hydrocele, unspecified: Secondary | ICD-10-CM | POA: Diagnosis not present

## 2021-04-20 DIAGNOSIS — M546 Pain in thoracic spine: Secondary | ICD-10-CM | POA: Diagnosis not present

## 2021-04-20 DIAGNOSIS — R739 Hyperglycemia, unspecified: Secondary | ICD-10-CM | POA: Diagnosis not present

## 2021-04-20 DIAGNOSIS — Z7984 Long term (current) use of oral hypoglycemic drugs: Secondary | ICD-10-CM | POA: Diagnosis not present

## 2021-04-20 DIAGNOSIS — Z743 Need for continuous supervision: Secondary | ICD-10-CM | POA: Diagnosis not present

## 2021-04-20 DIAGNOSIS — R1011 Right upper quadrant pain: Secondary | ICD-10-CM | POA: Diagnosis not present

## 2021-04-20 DIAGNOSIS — F17210 Nicotine dependence, cigarettes, uncomplicated: Secondary | ICD-10-CM | POA: Diagnosis not present

## 2021-04-20 DIAGNOSIS — E1142 Type 2 diabetes mellitus with diabetic polyneuropathy: Secondary | ICD-10-CM | POA: Insufficient documentation

## 2021-04-20 DIAGNOSIS — K760 Fatty (change of) liver, not elsewhere classified: Secondary | ICD-10-CM | POA: Diagnosis not present

## 2021-04-20 DIAGNOSIS — R69 Illness, unspecified: Secondary | ICD-10-CM | POA: Diagnosis not present

## 2021-04-20 LAB — COMPREHENSIVE METABOLIC PANEL
ALT: 23 U/L (ref 0–44)
AST: 28 U/L (ref 15–41)
Albumin: 3.1 g/dL — ABNORMAL LOW (ref 3.5–5.0)
Alkaline Phosphatase: 78 U/L (ref 38–126)
Anion gap: 7 (ref 5–15)
BUN: 17 mg/dL (ref 6–20)
CO2: 26 mmol/L (ref 22–32)
Calcium: 8.8 mg/dL — ABNORMAL LOW (ref 8.9–10.3)
Chloride: 106 mmol/L (ref 98–111)
Creatinine, Ser: 0.7 mg/dL (ref 0.61–1.24)
GFR, Estimated: >60 mL/min (ref >60)
Glucose, Bld: 254 mg/dL — ABNORMAL HIGH (ref 70–99)
Potassium: 4.4 mmol/L (ref 3.5–5.1)
Sodium: 139 mmol/L (ref 135–145)
Total Bilirubin: 1.1 mg/dL (ref 0.3–1.2)
Total Protein: 6.6 g/dL (ref 6.5–8.1)

## 2021-04-20 LAB — CBC WITH DIFFERENTIAL/PLATELET
Abs Immature Granulocytes: 0.06 10*3/uL (ref 0.00–0.07)
Basophils Absolute: 0.1 10*3/uL (ref 0.0–0.1)
Basophils Relative: 1 %
Eosinophils Absolute: 0.3 10*3/uL (ref 0.0–0.5)
Eosinophils Relative: 5 %
HCT: 36.4 % — ABNORMAL LOW (ref 39.0–52.0)
Hemoglobin: 12.1 g/dL — ABNORMAL LOW (ref 13.0–17.0)
Immature Granulocytes: 1 %
Lymphocytes Relative: 28 %
Lymphs Abs: 1.6 10*3/uL (ref 0.7–4.0)
MCH: 29.6 pg (ref 26.0–34.0)
MCHC: 33.2 g/dL (ref 30.0–36.0)
MCV: 89 fL (ref 80.0–100.0)
Monocytes Absolute: 0.6 10*3/uL (ref 0.1–1.0)
Monocytes Relative: 11 %
Neutro Abs: 3.2 10*3/uL (ref 1.7–7.7)
Neutrophils Relative %: 54 %
Platelets: 300 10*3/uL (ref 150–400)
RBC: 4.09 MIL/uL — ABNORMAL LOW (ref 4.22–5.81)
RDW: 11.9 % (ref 11.5–15.5)
WBC: 5.8 10*3/uL (ref 4.0–10.5)
nRBC: 0 % (ref 0.0–0.2)

## 2021-04-20 LAB — URINALYSIS, ROUTINE W REFLEX MICROSCOPIC
Bacteria, UA: NONE SEEN
Bilirubin Urine: NEGATIVE
Glucose, UA: >1000 mg/dL — AB
Hgb urine dipstick: NEGATIVE
Ketones, ur: NEGATIVE mg/dL
Leukocytes,Ua: NEGATIVE
Nitrite: NEGATIVE
Protein, ur: NEGATIVE mg/dL
Specific Gravity, Urine: 1.01 (ref 1.005–1.030)
pH: 6 (ref 5.0–8.0)

## 2021-04-20 IMAGING — US US SCROTUM
1 series · 15 of 25 positions shown · non-contrast
Comparison: [DATE]

CLINICAL DATA: LEFT scrotal pain for 1 week

EXAM:
SCROTAL ULTRASOUND
DOPPLER ULTRASOUND OF THE TESTICLES
TECHNIQUE: Complete ultrasound examination of the testicles, epididymis, and
other scrotal structures was performed. Color and spectral Doppler
ultrasound were also utilized to evaluate blood flow to the
testicles.

[Series 1: us scrotum mc & wl · 15 of 34 slices shown]
[im 1/34]
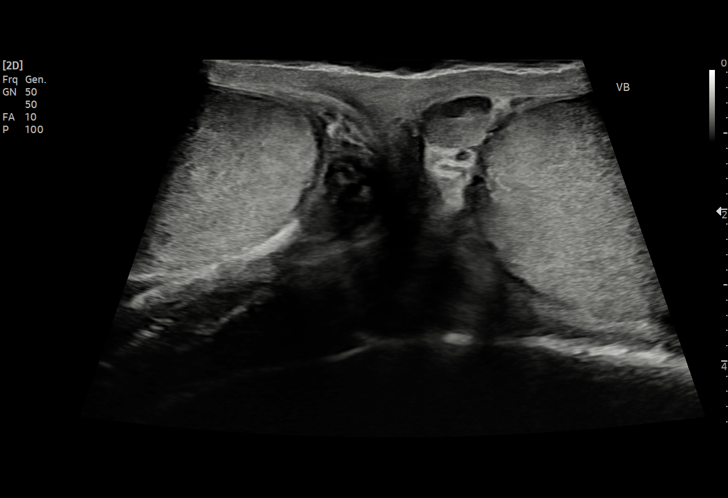
[im 3/34]
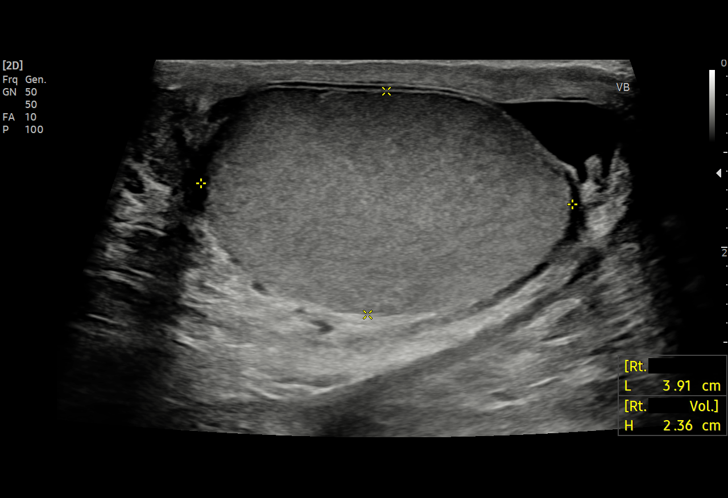
[im 6/34]
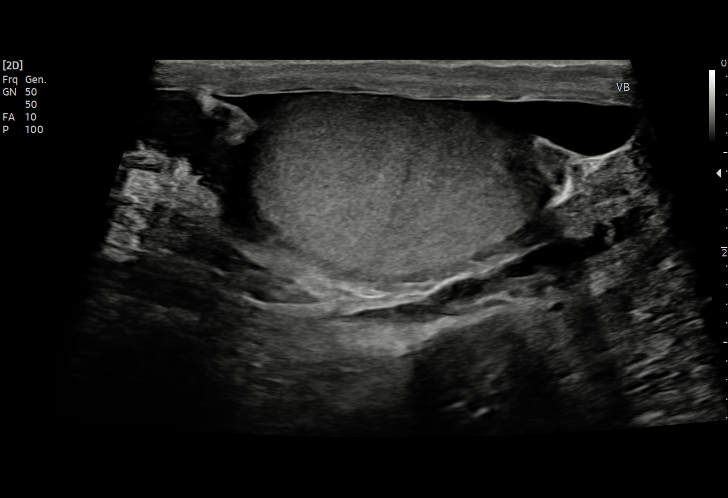
[im 7/34]
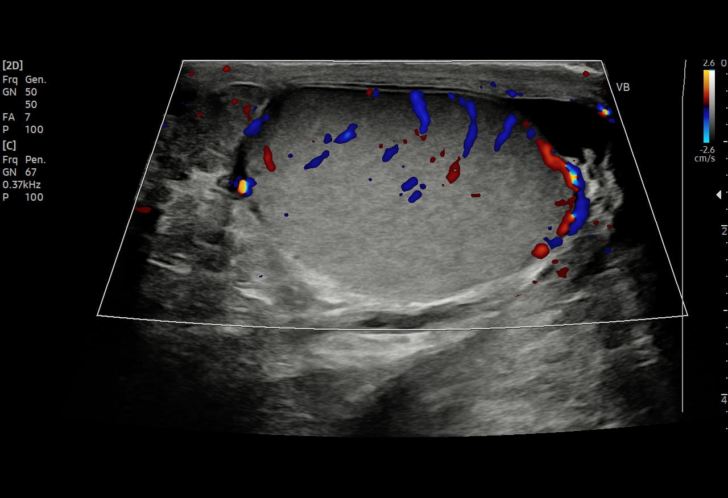
[im 10/34]
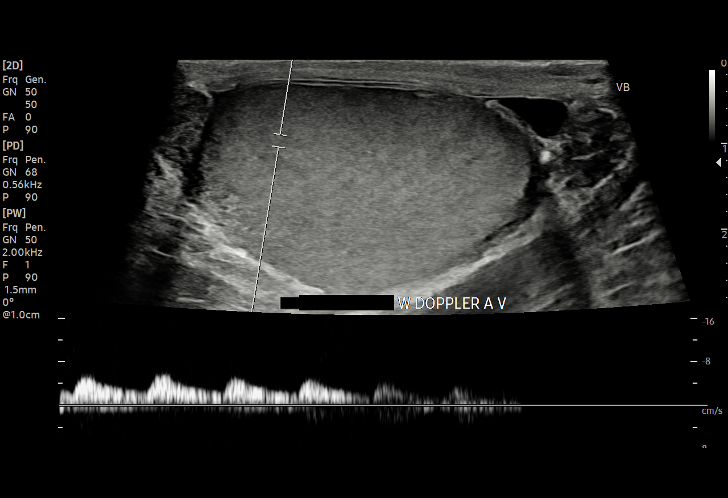
[im 13/34]
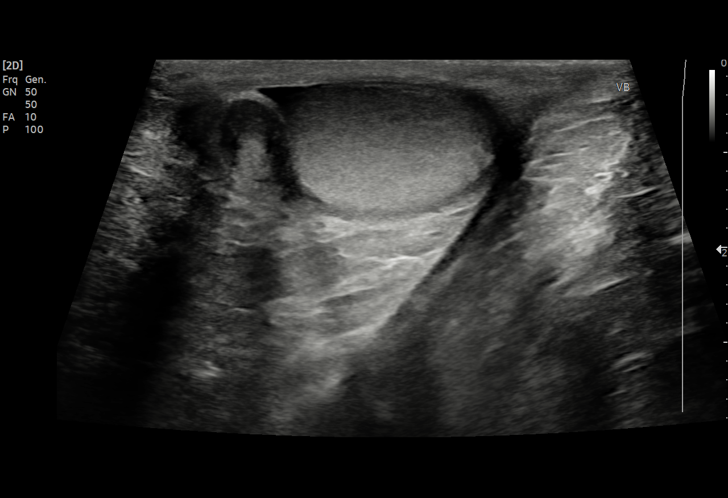
[im 14/34]
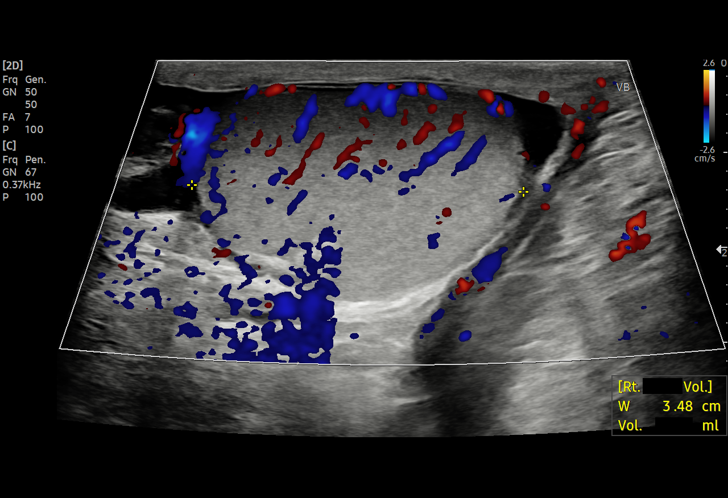
[im 17/34]
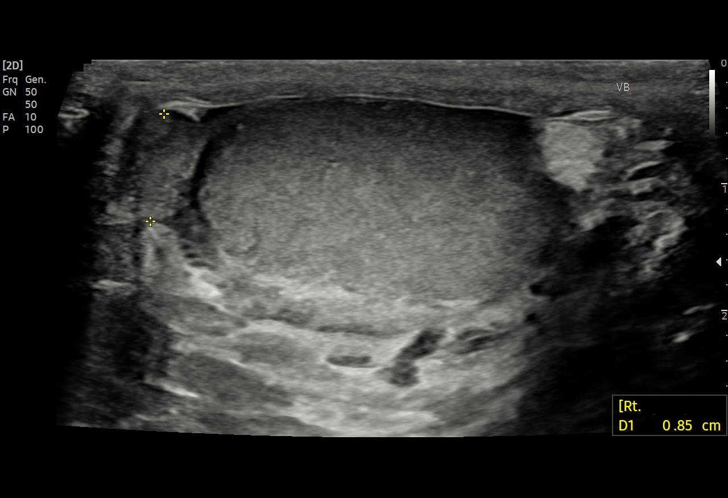
[im 20/34]
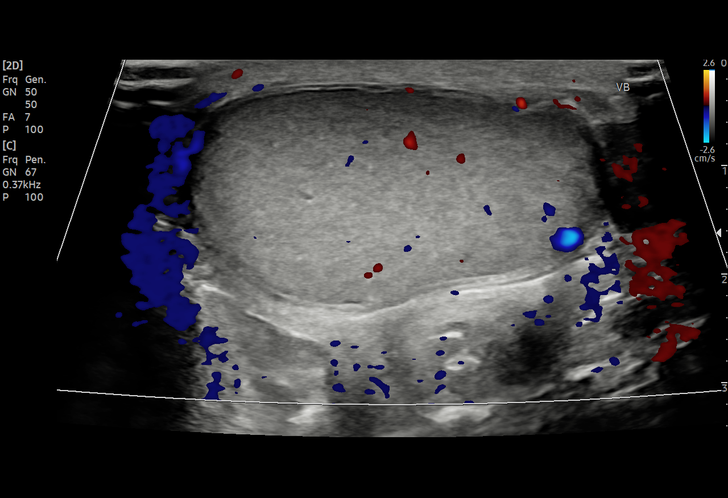
[im 21/34]
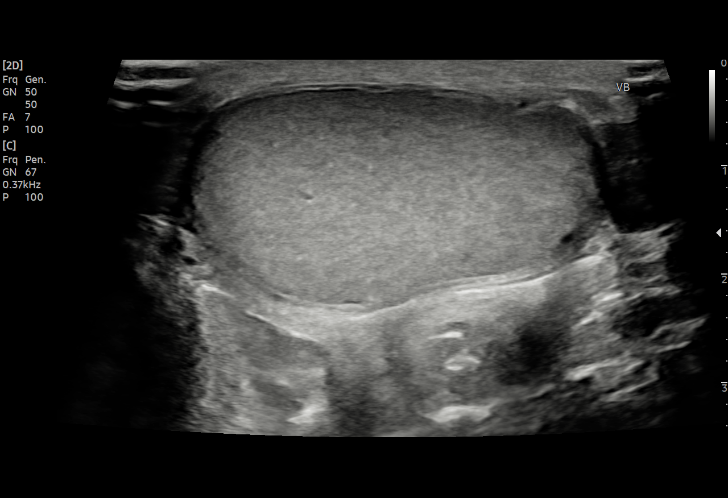
[im 24/34]
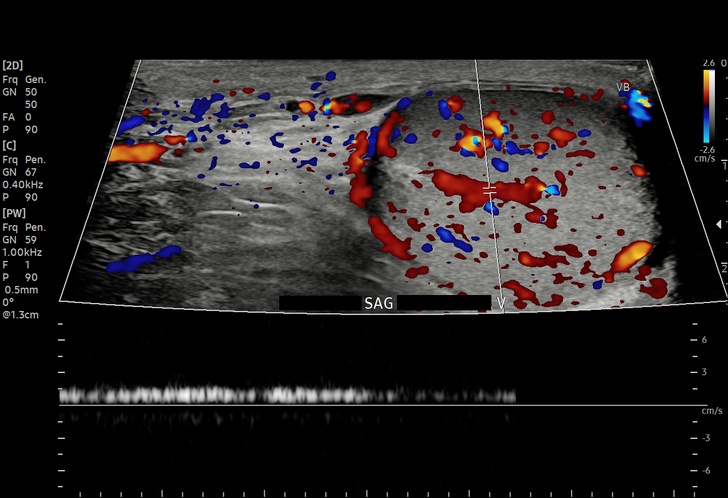
[im 27/34]
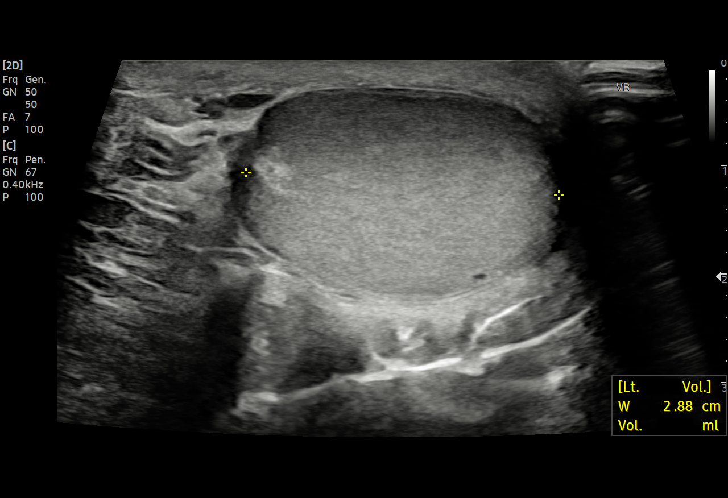
[im 28/34]
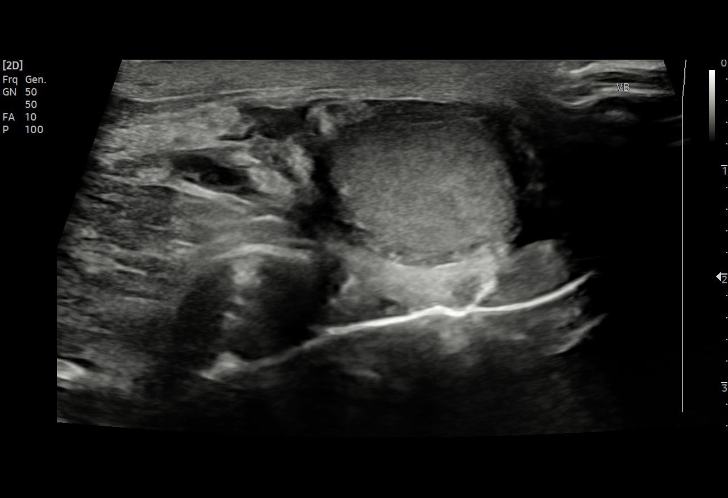
[im 31/34]
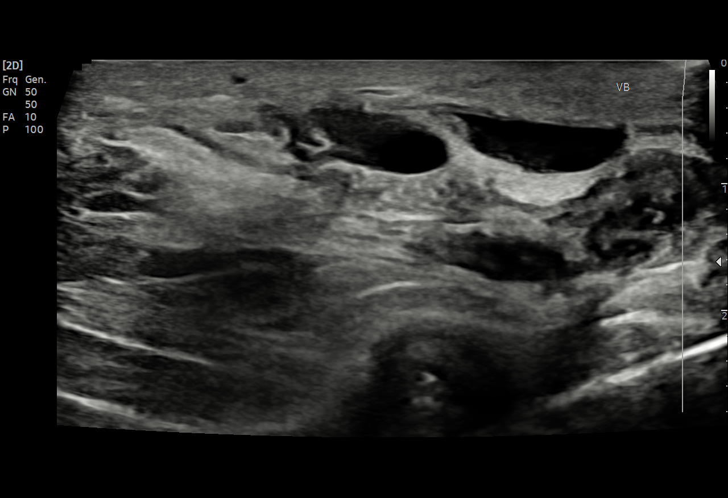
[im 34/34]
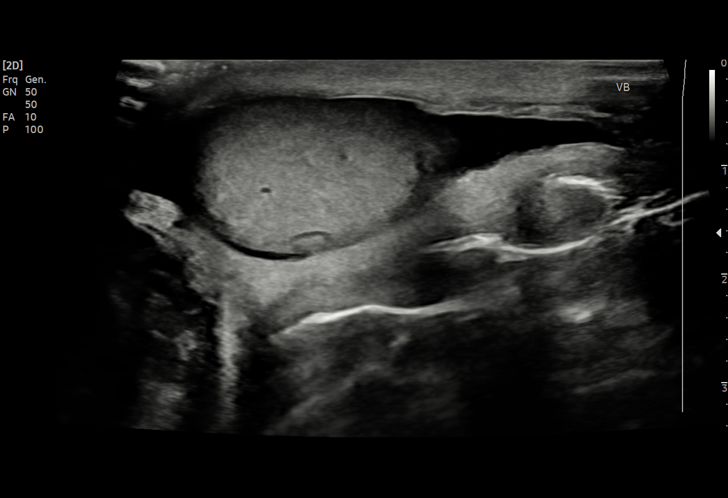

[15 of 25 positions shown; findings below may reference images not displayed]

FINDINGS: Right testicle

Measurements: 3.9 x 2.4 x 3.5 cm. Normal echogenicity without mass
or calcification. Internal blood flow present on color Doppler
imaging.

Left testicle

Measurements: 3.8 x 2.0 x 2.9 cm. Normal echogenicity without mass
or calcification. Internal blood flow present on color Doppler
imaging.

Right epididymis:  Normal in size and appearance.

Left epididymis:  Normal in size and appearance.

Hydrocele:  Small BILATERAL hydroceles, larger on RIGHT

Varicocele:  Absent

Pulsed Doppler interrogation of both testes demonstrates normal low
resistance arterial and venous waveforms bilaterally.
IMPRESSION: Small BILATERAL hydroceles, larger on RIGHT.

Otherwise normal exam.

## 2021-04-20 IMAGING — CR DG CHEST 2V
2 series · 2 of 2 positions shown · non-contrast
Comparison: Chest x-ray [DATE], CT chest [DATE]

CLINICAL DATA: Pain. Right flank pain. Radiates to the testicle.
Going on for a week.

EXAM:
CHEST - 2 VIEW

[w chest lat]
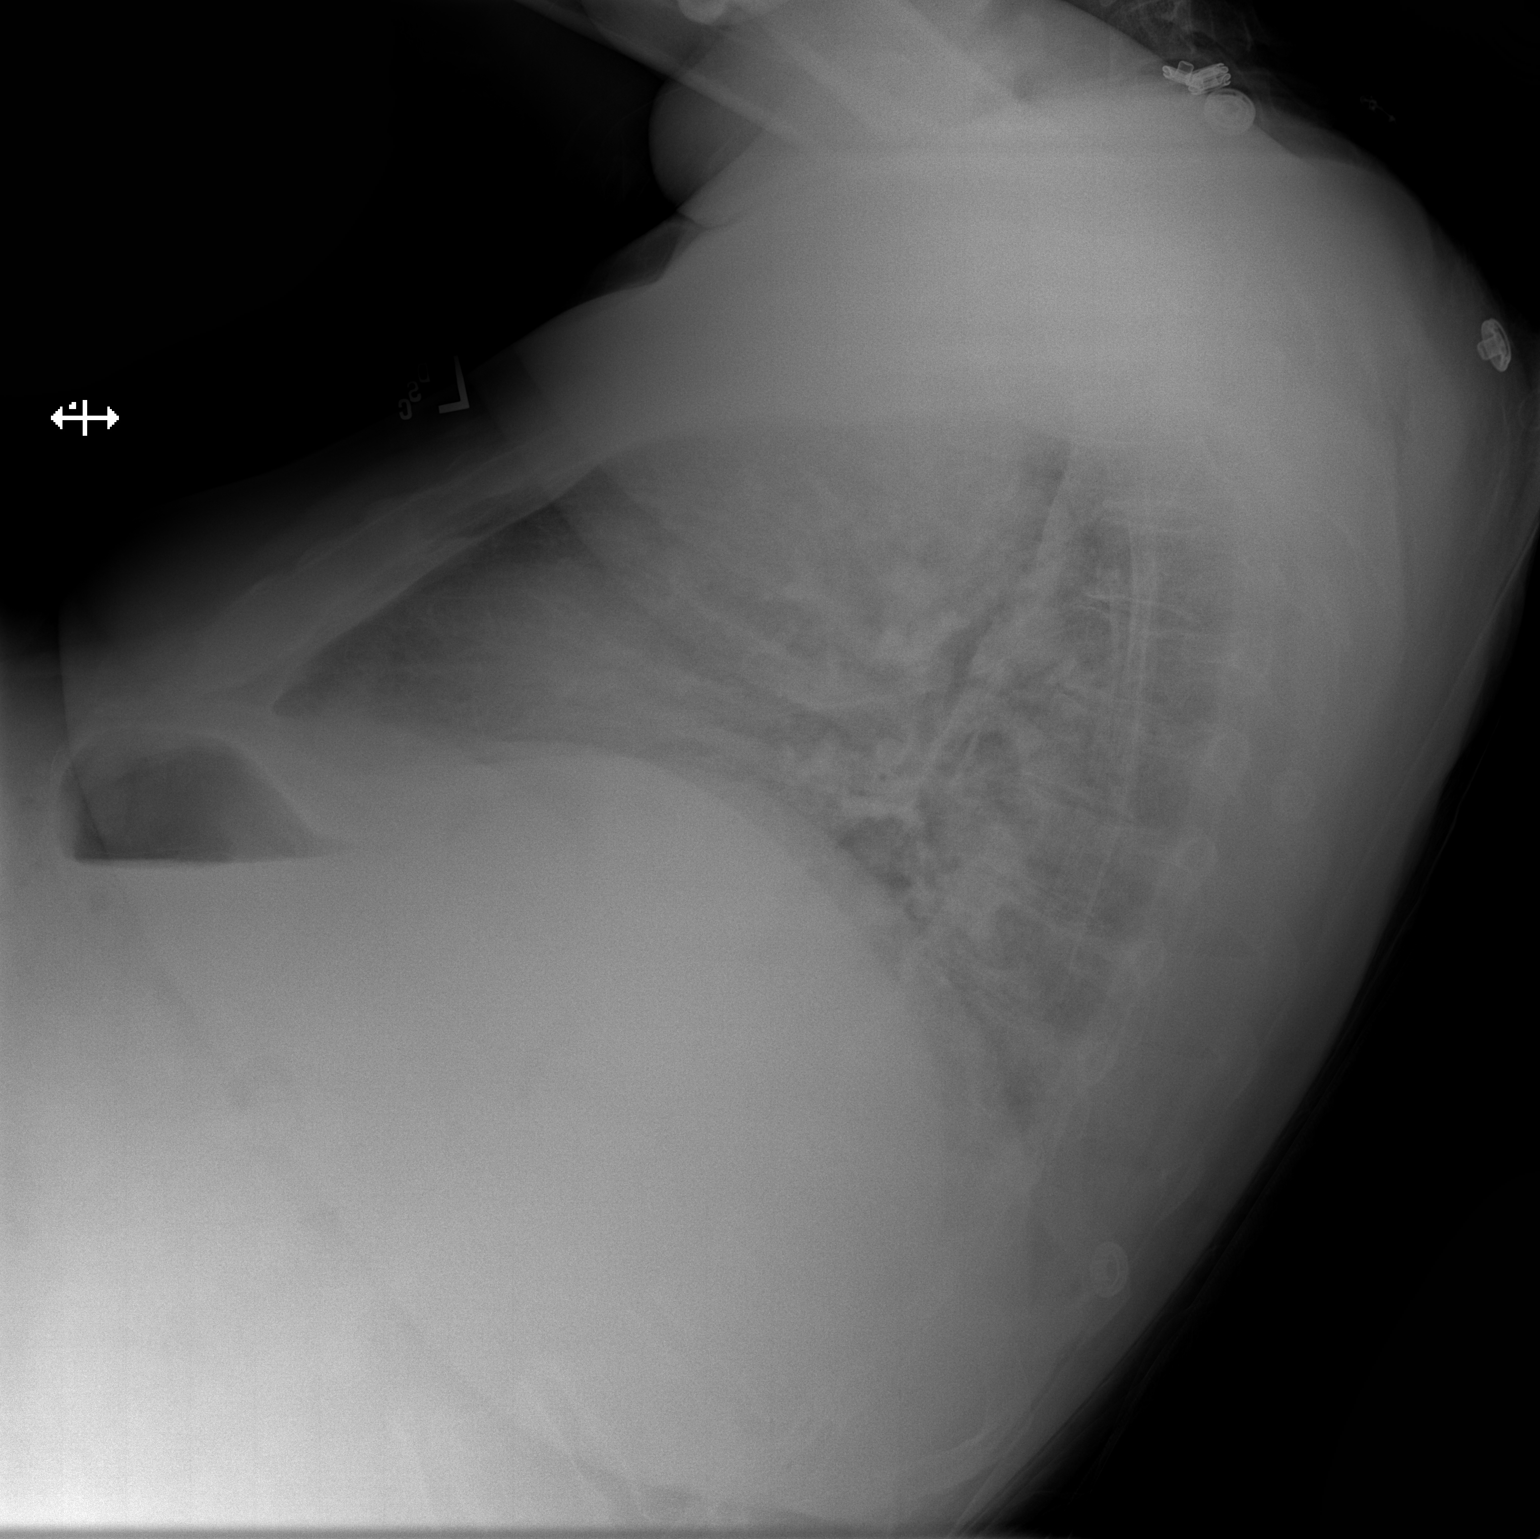

[x chest ap]
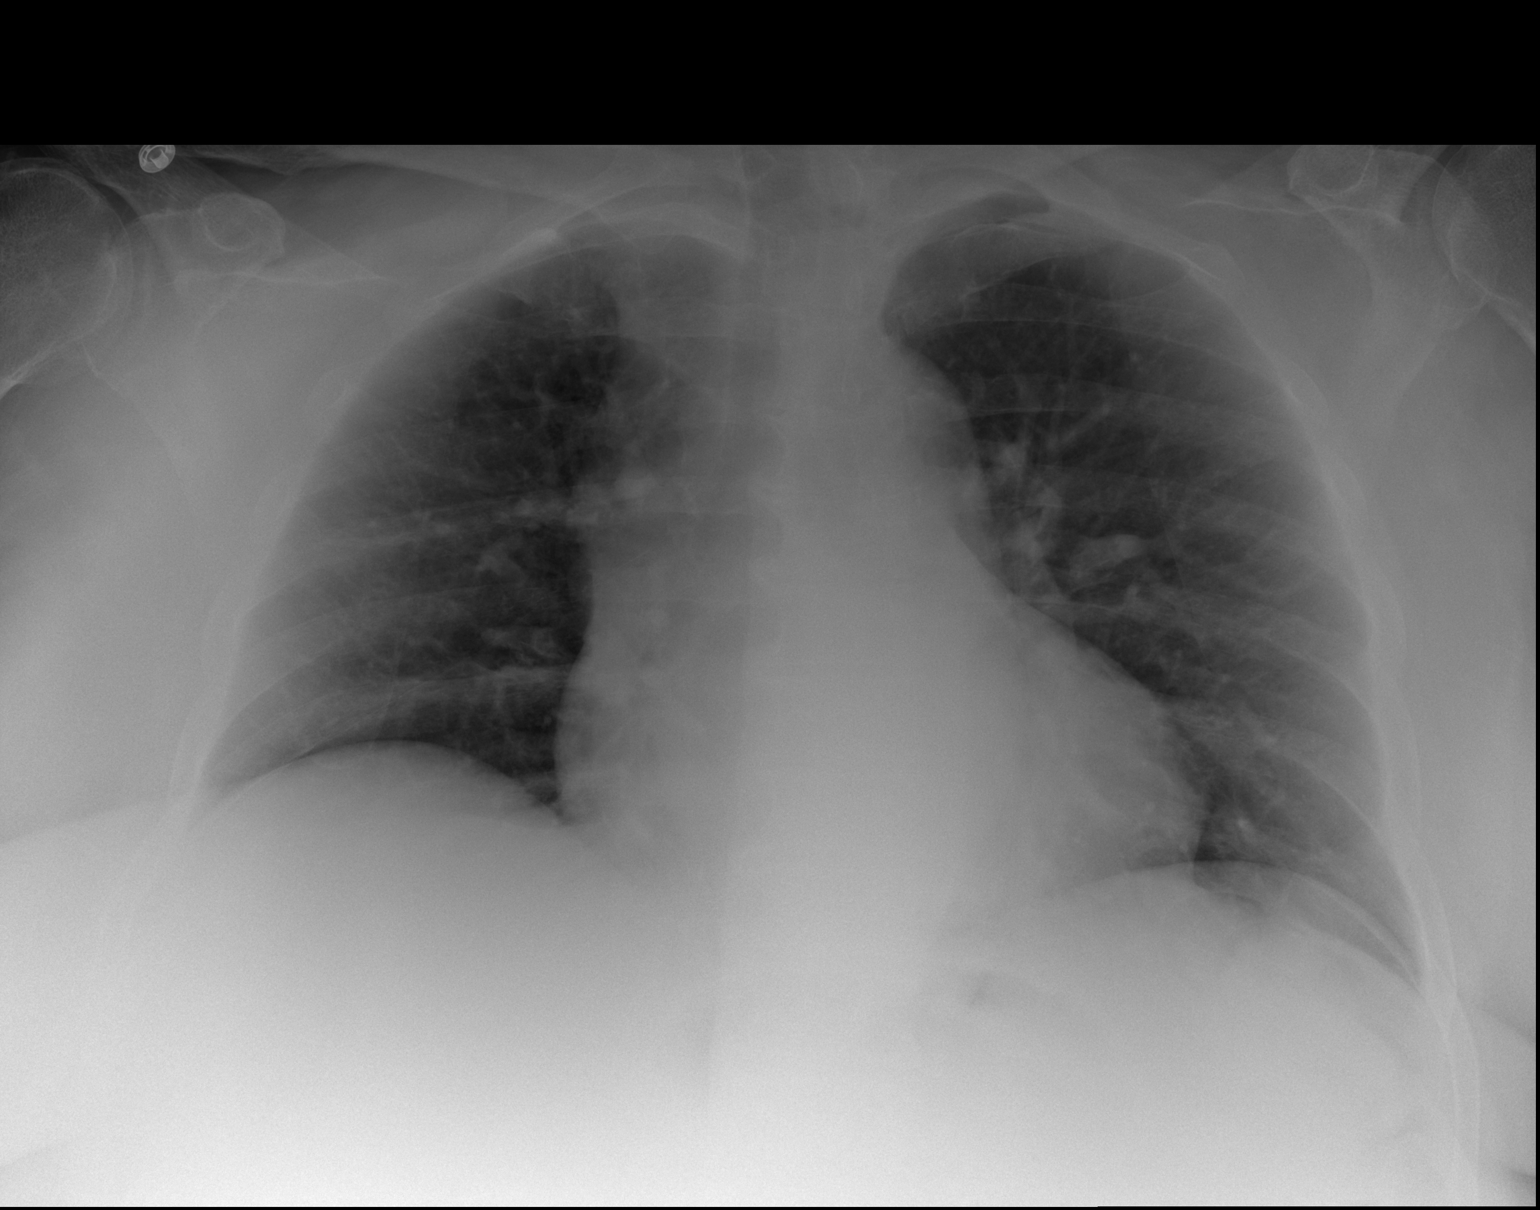

[2 of 2 positions shown; findings below may reference images not displayed]

FINDINGS: The heart and mediastinal contours are unchanged.

Low lung volumes with no focal consolidation. No pulmonary edema. No
pleural effusion. No pneumothorax.

No acute osseous abnormality.
IMPRESSION: No active cardiopulmonary disease.

## 2021-04-20 IMAGING — US US ABDOMEN LIMITED
1 series · 15 of 25 positions shown · non-contrast
Comparison: CT renal [DATE]

CLINICAL DATA: Abdominal pain.

EXAM:
ULTRASOUND ABDOMEN LIMITED RIGHT UPPER QUADRANT

[Series 1: us abdomen limited ruq mc & wl · 15 of 31 slices shown]
[im 1/31]
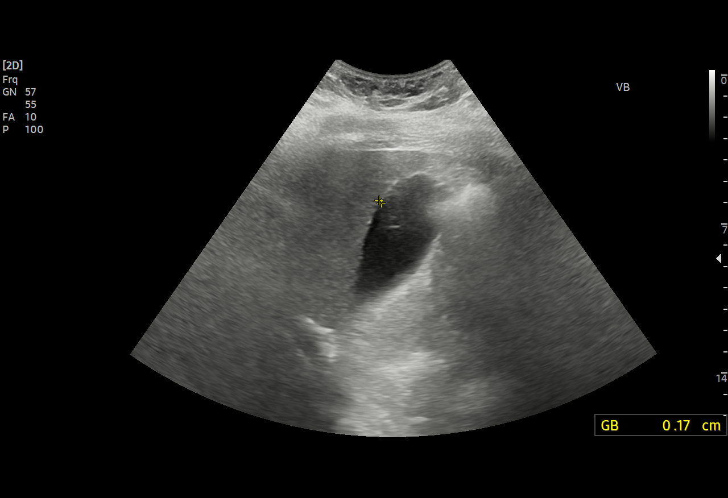
[im 3/31]
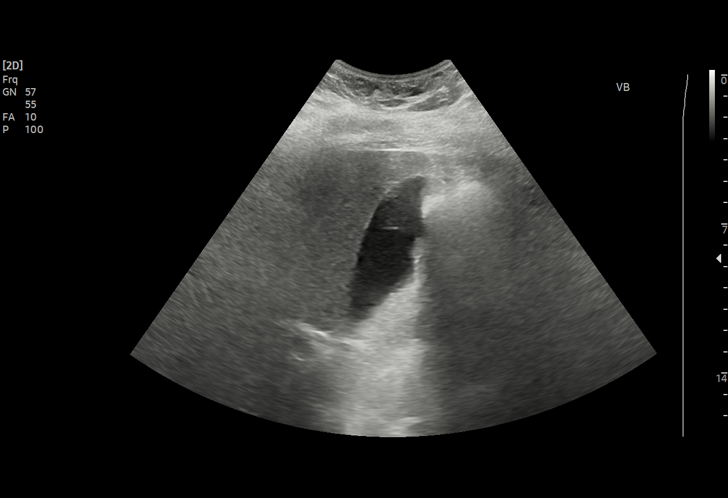
[im 6/31]
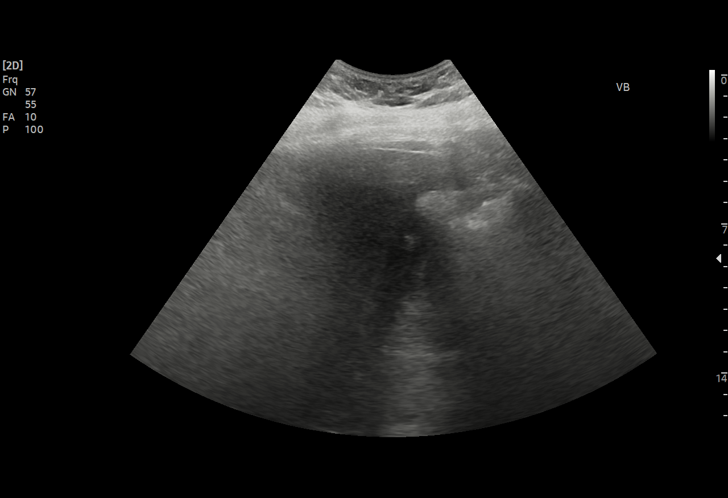
[im 7/31]
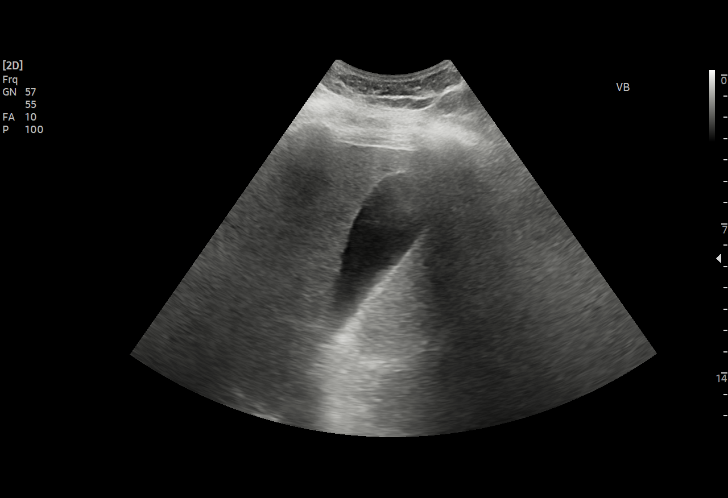
[im 9/31]
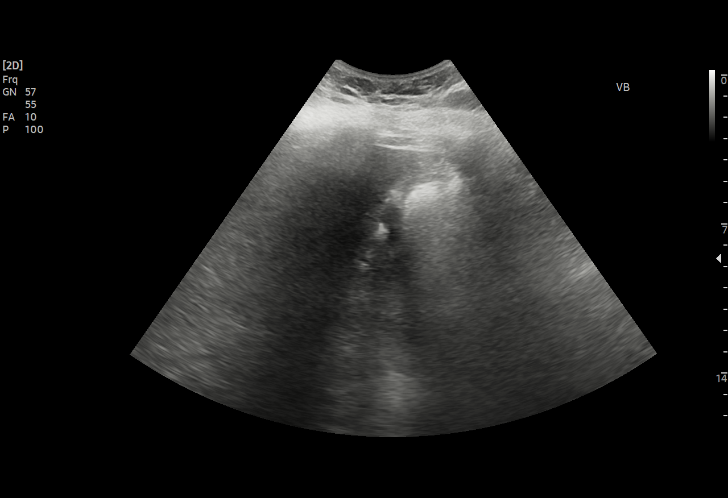
[im 12/31]
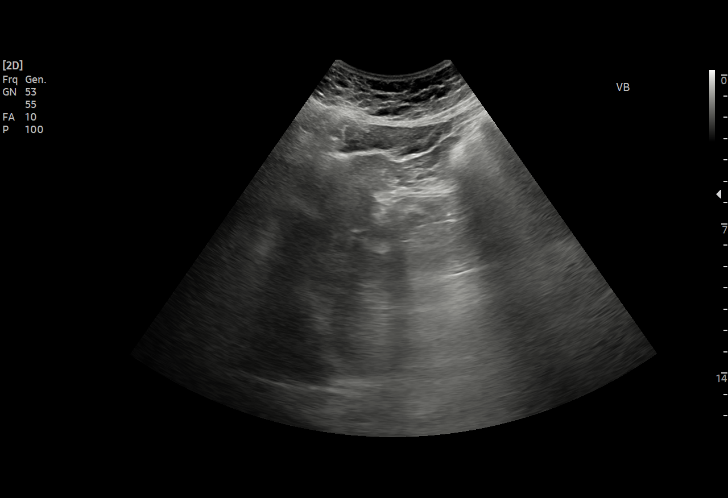
[im 13/31]
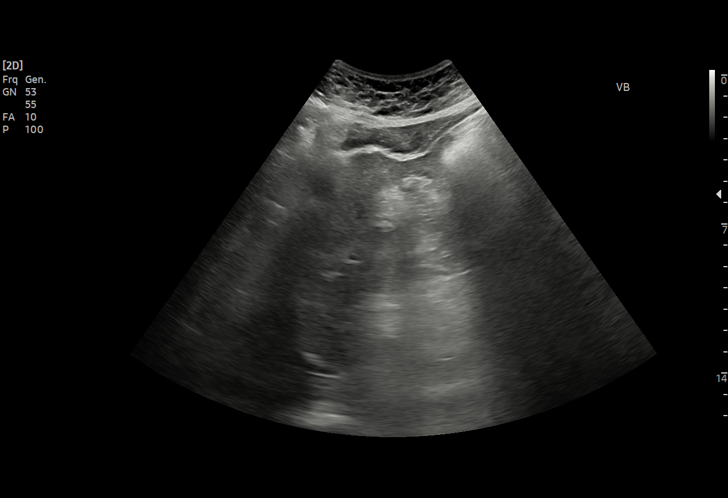
[im 16/31]
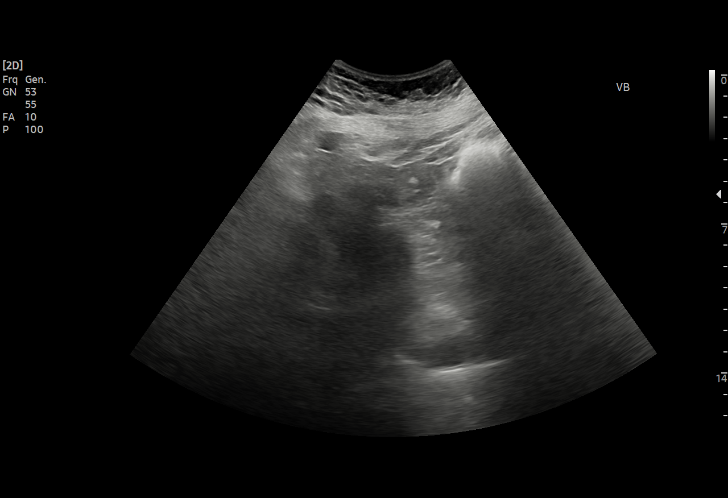
[im 18/31]
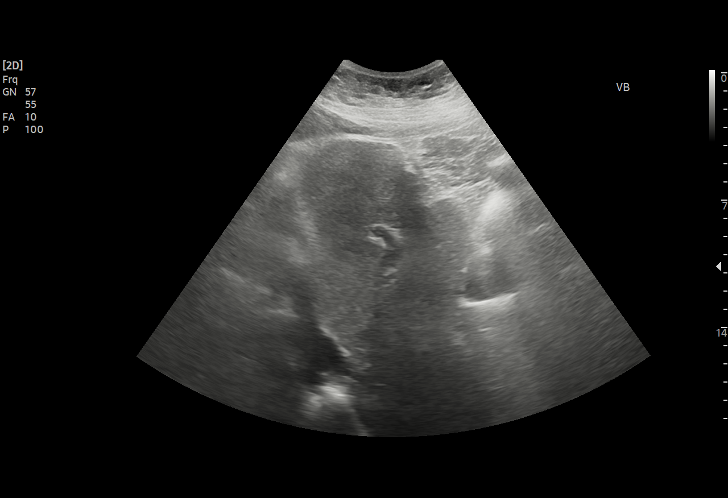
[im 19/31]
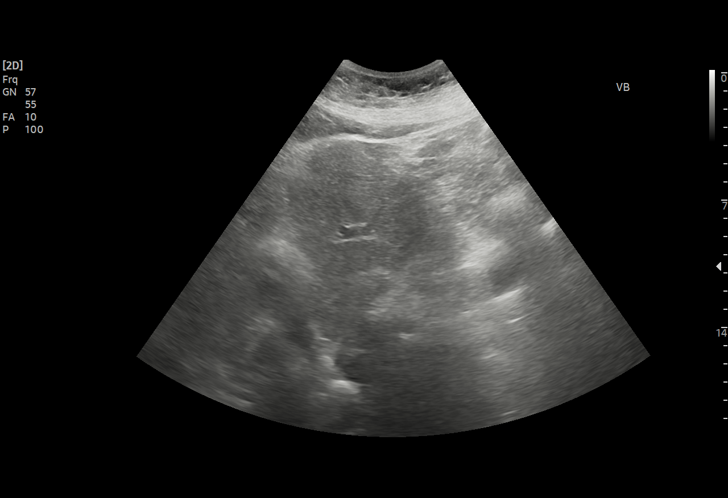
[im 22/31]
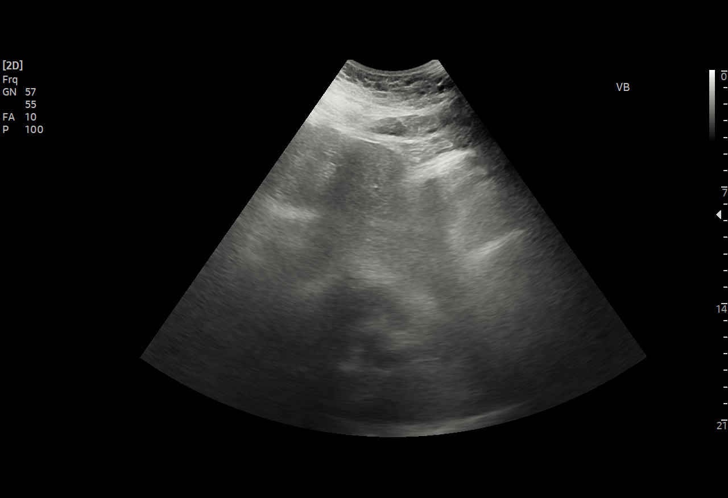
[im 24/31]
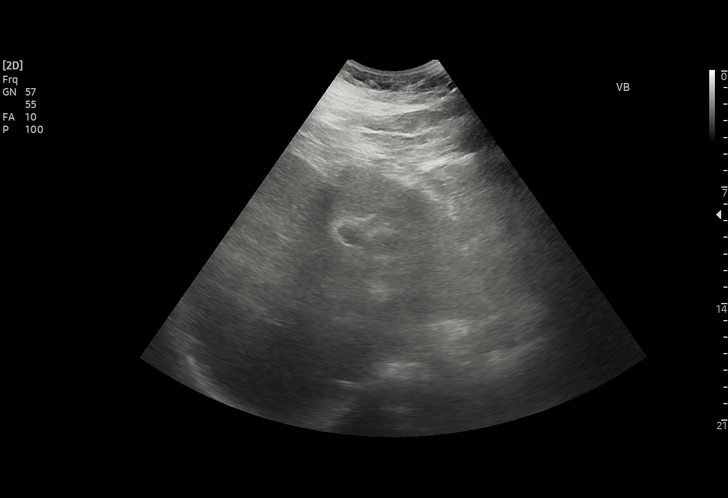
[im 26/31]
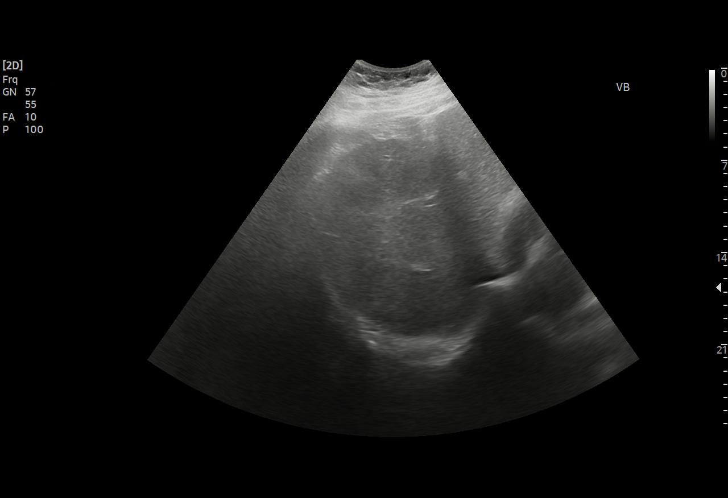
[im 28/31]
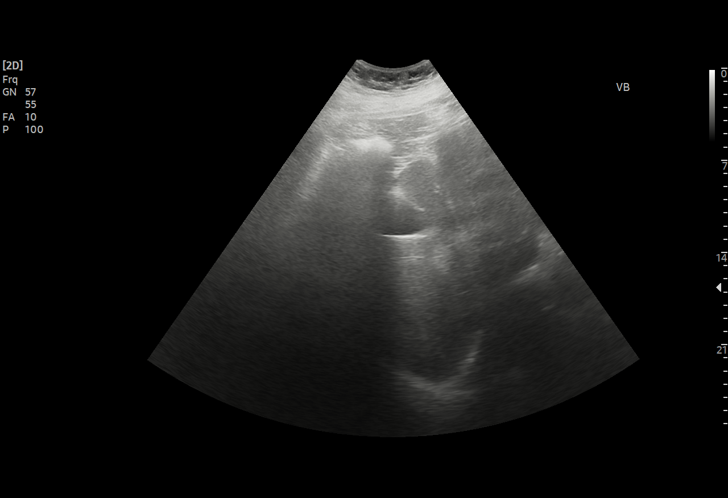
[im 31/31]
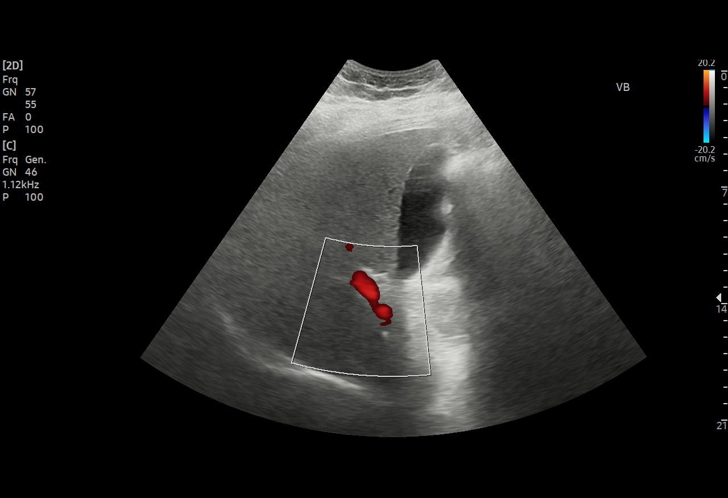

[15 of 25 positions shown; findings below may reference images not displayed]

FINDINGS: Gallbladder:

No gallstones or wall thickening visualized. No sonographic Murphy
sign noted by sonographer.

Common bile duct:

Diameter: 4 mm.

Liver:

No focal lesion identified. Increased parenchymal echogenicity.
Portal vein is patent on color Doppler imaging with normal direction
of blood flow towards the liver.

Other: None.
IMPRESSION: 1. Markedly limited evaluation due to decreased penetration of the
acoustic ultrasound sound waves. Consider cross-sectional imaging
with intravenous contrast for further evaluation.
2. Hepatic steatosis.

## 2021-04-20 MED ORDER — HYDRALAZINE HCL 50 MG PO TABS: 50.0000 mg | ORAL_TABLET | Freq: Three times a day (TID) | ORAL | 0 refills | Status: DC

## 2021-04-20 MED ORDER — LORAZEPAM 1 MG PO TABS
2.0000 mg | ORAL_TABLET | Freq: Once | ORAL | Status: AC
  Administered 2021-04-20: 2 mg via ORAL
  Filled 2021-04-20: qty 2

## 2021-04-20 MED ORDER — KETOROLAC TROMETHAMINE 30 MG/ML IJ SOLN
30.0000 mg | Freq: Once | INTRAMUSCULAR | Status: AC
  Administered 2021-04-20: 30 mg via INTRAVENOUS
  Filled 2021-04-20: qty 1

## 2021-04-20 NOTE — ED Provider Notes (Addendum)
Patient initially seen by Dr. Joya Gaskins.  Please see her note.  Pt waiting for ultrasound scrotum and ruq.  If negative ok for discharge.  Ultrasound findings limited.  CT scan imaging recommended.  Patient did have a CT scan 7 days ago on August 24.  No acute findings noted other than 2 nonobstructing left renal stones.    Scotal ultraSound showed small bilateral hydroceles.  Otherwise normal.    Pt appears comfortable at this time.  Discussed findings.  Stable for discharge.      Dorie Rank, MD 04/20/21 1816

## 2021-04-20 NOTE — Discharge Instructions (Addendum)
Continue your current medications.  Follow-up with your primary care doctor for further evaluation

## 2021-04-20 NOTE — ED Triage Notes (Signed)
Pt BIB EMS from home. Pt c/o right flank pain that radiates to testicles, going on for 1 week. Pt went to [place] 8/24 for same.  180/110 CBG 327 (pt states this is normal for him) 88 HR

## 2021-04-20 NOTE — Patient Instructions (Signed)
Visit Information  PATIENT GOALS:  Goals Addressed             This Visit's Progress    Chronic Care Management Pharmacy Care Plan       Current Barriers:  Unable to independently afford treatment regimen Unable to independently monitor therapeutic efficacy Unable to achieve control of type 2 diabetes and high blood pressure / hypertension Does not adhere to prescribed medication regimen Frequent ED visits  Pharmacist Clinical Goal(s):  Over the next 45 days, patient will verbalize ability to afford treatment regimen achieve adherence to monitoring guidelines and medication adherence to achieve therapeutic efficacy achieve control of type 2 diabetes and high blood pressure / hypertension as evidenced by attainment of goals listed below Decrease frequency of ED visits through collaboration with PharmD and provider.   Interventions: 1:1 collaboration with Carollee Herter, Alferd Apa, DO regarding development and update of comprehensive plan of care as evidenced by provider attestation and co-signature Inter-disciplinary care team collaboration (see longitudinal plan of care) Comprehensive medication review performed; medication list updated in electronic medical record    Diabetes: Lab Results  Component Value Date   HGBA1C 7.7 (H) 02/14/2021  A1c 03/23/2021 at [facility] was 6.9%  Controlled - (goal A1c <7%) Much improved BG since started using Libre and taking insuiln regularly States cost of insulin still difficult but he cannot find application I sent for patient assistnace.  Current treatment: Tresiba 46 units once daily  Novolog 12 units three times a day with meals (told by pharmacist when he picked up prednisone to increase to 15 units with meals)  Metformin XR '500mg'$  - 2 Tablets with Breakfast and 2 tablets with Supper Interventions / Recommendations Recommended continue to take medications as directed by Dr [name] Continue to check BG frequently during day with  [facility]. Mailed application for [employer] PAP again. Completed patient portion of [employer] PAP application and return to Primary Care office.   Patient was denied LIS but he forgot to bring to letter to office.   Hypertension: BP Readings from Last 3 Encounters:  03/21/21 114/72  03/16/21 (!) 176/99  02/18/21 (!) 168/95  Last BP improved but still not consistently at goal - BP goal <140/90 Current treatment: Amlodipine '10mg'$  daily  Hydralazine '50mg'$  every 8 hours Losartan '100mg'$  daily  Interventions: (addressed at previous visits)  Counseled on importance of getting blood pressure to goal to prevent strokes and kidney damage Working to transition generic maintenance medications over [employer] order pharmacy.  Hyperlipidemia / Heart Disease: Lipid Panel     Component Value Date/Time   CHOL 118 11/28/2020 1502   TRIG 130.0 11/28/2020 1502   HDL 40.00 11/28/2020 1502   CHOLHDL 3 11/28/2020 1502   VLDL 26.0 11/28/2020 1502   LDLCALC 52 11/28/2020 1502  Controlled - LDL goal <70 Current treatment: Pravastatin '40mg'$  at bedtime Interventions:  Counseled on LDL goals Recommended continue current regimen for cholesterol  Chronic Pain / Leg Weakness: Currently not controlled Patient has had several ED visits for pain and weakness in the last month. Has appointment with pain management 02/16/2021. Current treatment:  Tramadol '50mg'$  - take 1 or 2 tablets up to every 8 hours as needed Methocarbamol '500mg'$  - take 1 tablet 4 times a day as needed for muscle spasms Voltaren Gel 1% -  apply to area of pain up to 4 times a day as needed Prednisone  Interventions:  Continue current regimen for pain  Encouraged patient to follow up with  neurology Ensure transportation has been set up for pain management appointment 02/16/2021  Continue with plan for testing by neurology  Medication Management:  Refill history shows that patient has not refilled hydralazine, metformin on time.   Patient also contact clinical pharmacist asking for assistance with medication refills from [facility] mail order. He tried to order medications but was told that he had meds ready to pick up at his local pharmacy.  [facility] had auto filled the following meds -  Interventions:  Coordinated with [facility] to reverse medications that were auto filled - Novolog, Tresiba, amlodipine, duloxetine, tamsulosin and losartan Coordinated getting amlodipine and hydralazine filled at [facility]. You will need to request delivery at [email or web address];  Coordinated refills at [facility] for the following medications: pravastatin, tamsulosin, metformin and losartan. You should receive the following medication in the next 5 to 7 days in the mail: Pravastatin, tamsulosin, metformin and losartan. When you have about 1 month left, you can request refills again at this number [phone] Important to keep regular check-in phone visits so that we can order / request refills BEFORE you run out of medication.    Patient Goals/Self-Care Activities Over the next 90 days, patient will:  take medications as prescribed focus on medication adherence by utilizing pharmacy service that delivers medications to his home, check glucose before meals and 2 hour after meals using CGM, document, and provide at future appointments collaborate with provider on medication access solutions   Follow up call planned In 2 weeks         Patient verbalizes understanding of instructions provided today and agrees to view in [place].   Telephone follow up appointment with care management team member scheduled for: 2 weeks  Cherre Robins, PharmD Clinical Pharmacist [facility]

## 2021-04-20 NOTE — ED Provider Notes (Signed)
[facility] DEPT Provider Note   CSN: [id number] Arrival date & time: 04/20/21  1300     History Chief Complaint  Patient presents with   Flank Pain    Tyler Brewer is a 60 y.o. male.  He complains of pain in the right mid back.  Pain radiates into the right testicle.  He states that he might have some swelling there.  He was seen in the emergency department on April 13, 2021.  He had a CT stone study as well as labs and urine.  He states that the pain is the same as it was when he was evaluated.  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  The history is provided by the patient.  Flank Pain This is a new problem. Episode onset: a week ago. The problem occurs constantly. The problem has not changed since onset.Associated symptoms include abdominal pain. Pertinent negatives include no chest pain, no headaches and no shortness of breath. Nothing aggravates the symptoms. Nothing relieves the symptoms. He has tried nothing for the symptoms. The treatment provided no relief.      Past Medical History:  Diagnosis Date   Arthritis    CAD (coronary artery disease)  cardiac cath with moderate disease in a septal branch of the ramus intermedius 04/01/2012   Depression    Diabetes mellitus    poorly controlled by his report   History of narcotic addiction ([place])    past history of back pain   Hypercholesteremia    Hypertension    IBS (irritable bowel syndrome)    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[place])    Neuropathy    Obesity    Max weight was 390   OSA on CPAP    Panic attacks    Testosterone deficiency    Vertigo     Patient Active Problem List   Diagnosis Date Noted   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   Diabetes mellitus ([place]) 01/03/2021   Type 2 diabetes mellitus with hyperglycemia, with long-term current use of insulin ([place]) 01/03/2021   Lower extremity  edema 11/28/2020   Chronic low back pain 11/28/2020   Psoriatic arthritis ([place]) 11/28/2020   Thoracic aortic aneurysm without rupture ([place]) 11/28/2020   Bilateral leg weakness 11/05/2020   Acute left-sided low back pain with right-sided sciatica 07/19/2020   Hip pain, acute, left 07/05/2020   Tremor 07/05/2020   Weakness 07/05/2020   Balance problem 03/11/2020   Tinea cruris 03/11/2020   Cellulitis of left groin 03/11/2020   Acute pain of right shoulder 03/11/2020   Interstitial lung disease ([place]) 05/02/2017   Pansinusitis 09/26/2016   Diabetic polyneuropathy associated with diabetes mellitus due to underlying condition ([place]) 05/08/2016   Methamphetamine use disorder, severe, dependence ([place]) 02/11/2016   Substance induced mood disorder ([place]) 02/11/2016   Exertional dyspnea 11/30/2015   ADD (attention deficit disorder) 09/29/2015   Binge eating 09/29/2015   Syncope 05/05/2012   Diarrhea 05/05/2012   Panic attacks 05/05/2012   OSA (obstructive sleep apnea) 05/05/2012   HTN (hypertension) 05/05/2012   Dyslipidemia 05/05/2012   CAD (coronary artery disease)  cardiac cath with moderate disease in a septal branch of the ramus intermedius 04/01/2012   Chest pain 04/01/2012   Drug abuse and dependence ([place]) 04/01/2012   Family history of coronary artery disease 03/31/2012   Sleep apnea, on C-pap 03/31/2012   Hyperlipemia 03/30/2012   HTN (hypertension), benign 03/30/2012  Morbid obesity ([place]) 03/30/2012   DM type 2, uncontrolled, with neuropathy ([place]) 03/30/2012   Depression with suicidal ideation 03/30/2012    Past Surgical History:  Procedure Laterality Date   CARDIAC CATHETERIZATION     IR FLUORO GUIDE CV LINE RIGHT  11/10/2020   IR US GUIDE VASC ACCESS RIGHT  11/10/2020   LEFT HEART CATHETERIZATION WITH CORONARY ANGIOGRAM N/A 03/31/2012   Procedure: LEFT HEART CATHETERIZATION WITH CORONARY ANGIOGRAM;  Surgeon: Leonie Man, MD;  Location: [facility] CATH LAB;  Service:  Cardiovascular;  Laterality: N/A;       Family History  Problem Relation Age of Onset   Diabetes type II Father    Hypertension Father    Pancreatic disease Father 71       Deceased   Healthy Mother    Healthy Sister    Healthy Son    Healthy Daughter    Parkinson's disease Maternal Grandmother     Social History   Tobacco Use   Smoking status: Every Day    Packs/day: 0.50    Types: Cigarettes    Last attempt to quit: 09/29/2020    Years since quitting: 0.5   Smokeless tobacco: Never  Vaping Use   Vaping Use: Never used  Substance Use Topics   Alcohol use: No   Drug use: Not Currently    Types: Methamphetamines    Home Medications Prior to Admission medications   Medication Sig Start Date End Date Taking? Authorizing Provider  acetaminophen (TYLENOL) 500 MG tablet Take 500 mg by mouth every 6 (six) hours as needed. States he is taking one to two tablets a day    [provider]  amLODipine (NORVASC) 10 MG tablet Take 1 tablet (10 mg total) by mouth daily. 01/26/21   [name], DO  COMFORT EZ INSULIN SYRINGE 31G X 5/16" 1 ML MISC  12/15/20   [provider]  DULoxetine (CYMBALTA) 30 MG capsule Take 1 capsule (30 mg total) by mouth daily. Patient not taking: No sig reported 07/19/20   Carollee Herter, [place] Fries R, DO  furosemide (LASIX) 20 MG tablet TAKE 1 TABLET(20 MG) BY MOUTH DAILY 12/21/20   Carollee Herter, Alferd Apa, DO  furosemide (LASIX) 40 MG tablet Take by mouth. Patient not taking: Reported on 04/19/2021 02/08/21   [provider]  gabapentin (NEURONTIN) 300 MG capsule Take 600 mg by mouth 3 (three) times daily. 02/16/21   [provider]  hydrALAZINE (APRESOLINE) 50 MG tablet Take 1 tablet (50 mg total) by mouth every 8 (eight) hours. 01/26/21   [name], DO  ibuprofen (ADVIL) 200 MG tablet Take 200 mg by mouth every 6 (six) hours as needed. Patient not taking: Reported on 04/19/2021    [provider]  insulin  aspart (NOVOLOG FLEXPEN) 100 UNIT/ML FlexPen Inject 12 Units into the skin 3 (three) times daily with meals. Patient not taking: Reported on 04/19/2021 01/03/21   [name], Melanie Crazier, MD  insulin degludec (TRESIBA FLEXTOUCH) 200 UNIT/ML FlexTouch Pen Inject 46 Units into the skin daily. Patient not taking: Reported on 04/19/2021 01/03/21   [name], Melanie Crazier, MD  insulin NPH Human (NOVOLIN N RELION) 100 UNIT/ML injection Inject 35 Units into the skin at bedtime.    [provider]  Insulin Pen Needle 31G X 5 MM MISC 1 Device by Does not apply route in the morning, at noon, in the evening, and at bedtime. 01/03/21   [name], Melanie Crazier, MD  Insulin Regular Human (  HUMULIN R) 100 UNIT/ML KwikPen Inject 10 Units as directed 3 (three) times daily before meals. Uses [employer] Relion R insulin    [provider]  losartan (COZAAR) 100 MG tablet TAKE ONE TABLET BY MOUTH DAILY 04/18/21   Carollee Herter, Alferd Apa, DO  meclizine (ANTIVERT) 25 MG tablet Take 1 tablet (25 mg total) by mouth 3 (three) times daily as needed for dizziness. 02/18/21   Molpus, John, MD  meloxicam (MOBIC) 15 MG tablet Take 1 tablet (15 mg total) by mouth daily. 03/14/21   Kirsteins, Luanna Salk, MD  Menthol, Topical Analgesic, (ICY HOT EX) Apply topically as needed. Patient not taking: Reported on 04/19/2021    [provider]  metaxalone (SKELAXIN) 400 MG tablet Take 1 tablet (400 mg total) by mouth 3 (three) times daily as needed. 03/02/21   [name], DO  metFORMIN (GLUCOPHAGE-XR) 500 MG 24 hr tablet Take 2 tablets (1,000 mg total) by mouth in the morning and at bedtime. 01/26/21   [name], DO  pravastatin (PRAVACHOL) 40 MG tablet Take 1 tablet (40 mg total) by mouth daily. 01/26/21   [name], DO  pregabalin (LYRICA) 75 MG capsule Take 1 capsule (75 mg total) by mouth 2 (two) times daily. 03/14/21   Kirsteins, Luanna Salk, MD  tamsulosin (FLOMAX) 0.4 MG CAPS capsule  TAKE ONE CAPSULE BY MOUTH ONE TIME DAILY 04/18/21   Carollee Herter, Alferd Apa, DO  traMADol (ULTRAM) 50 MG tablet 2 po q6 h prn 02/03/21   [name], DO    Allergies    Patient has no known allergies.  Review of Systems   Review of Systems  Constitutional:  Negative for chills and fever.  HENT:  Negative for ear pain and sore throat.   Eyes:  Negative for pain and visual disturbance.  Respiratory:  Negative for cough and shortness of breath.   Cardiovascular:  Negative for chest pain and palpitations.  Gastrointestinal:  Positive for abdominal pain. Negative for vomiting.  Genitourinary:  Positive for flank pain and testicular pain. Negative for difficulty urinating, dysuria, genital sores, hematuria, penile discharge and urgency.  Musculoskeletal:  Negative for arthralgias and back pain.  Skin:  Negative for color change and rash.  Neurological:  Negative for seizures, syncope and headaches.  All other systems reviewed and are negative.  Physical Exam Updated Vital Signs BP (!) 186/107 (BP Location: Left Arm)   Pulse 88   Temp 98 F (36.7 C) (Oral)   Resp (!) 24   SpO2 100%   Physical Exam Vitals and nursing note reviewed. Exam conducted with a chaperone present.  Constitutional:      Appearance: Normal appearance.  HENT:     Head: Normocephalic and atraumatic.  Cardiovascular:     Rate and Rhythm: Normal rate and regular rhythm.     Heart sounds: Normal heart sounds.  Pulmonary:     Effort: Pulmonary effort is normal.     Breath sounds: Normal breath sounds.  Abdominal:     General: There is no distension.     Palpations: Abdomen is soft.     Tenderness: There is abdominal tenderness in the right upper quadrant. There is no guarding.     Hernia: There is no hernia in the left inguinal area or right inguinal area.  Genitourinary:    Pubic Area: No rash.      Penis: Normal.      Testes:  Right: Mass or swelling not present.        Left: Tenderness  present. Mass or swelling not present.  Musculoskeletal:       Arms:     Cervical back: Normal range of motion.     Right lower leg: No edema.     Left lower leg: No edema.     Comments: Tender to palpation at the inferior aspect of the right rib cage at the posterior thorax.  No deformities or crepitus.  Tenderness is slightly above the CVA.  Lymphadenopathy:     Lower Body: No right inguinal adenopathy. No left inguinal adenopathy.  Skin:    General: Skin is warm and dry.  Neurological:     General: No focal deficit present.     Mental Status: He is alert and oriented to person, place, and time.  Psychiatric:        Mood and Affect: Mood normal.        Behavior: Behavior normal.    ED Results / Procedures / Treatments   Labs (all labs ordered are listed, but only abnormal results are displayed) Labs Reviewed  COMPREHENSIVE METABOLIC PANEL - Abnormal; Notable for the following components:      Result Value   Glucose, Bld 254 (*)    Calcium 8.8 (*)    Albumin 3.1 (*)    All other components within normal limits  CBC WITH DIFFERENTIAL/PLATELET - Abnormal; Notable for the following components:   RBC 4.09 (*)    Hemoglobin 12.1 (*)    HCT 36.4 (*)    All other components within normal limits  URINALYSIS, ROUTINE W REFLEX MICROSCOPIC - Abnormal; Notable for the following components:   Color, Urine YELLOW (*)    APPearance CLEAR (*)    Glucose, UA >1,000 (*)    All other components within normal limits    EKG None  Radiology DG Chest 2 View  Result Date: 04/20/2021 CLINICAL DATA:  Pain. Right flank pain. Radiates to the testicle. Going on for a week. EXAM: CHEST - 2 VIEW COMPARISON:  Chest x-ray 04/01/2021, CT chest 04/01/2021 FINDINGS: The heart and mediastinal contours are unchanged. Low lung volumes with no focal consolidation. No pulmonary edema. No pleural effusion. No pneumothorax. No acute osseous abnormality. IMPRESSION: No active cardiopulmonary disease.  Electronically Signed   By: Iven Finn M.D.   On: 04/20/2021 15:22    Procedures Procedures   Medications Ordered in ED Medications  ketorolac (TORADOL) 30 MG/ML injection 30 mg (has no administration in time range)    ED Course  I have reviewed the triage vital signs and the nursing notes.  Pertinent labs & imaging results that were available during my care of the patient were reviewed by me and considered in my medical decision making (see chart for details).    MDM Rules/Calculators/A&P                           Tyler Brewer presents with right lower thoracic pain, abdominal pain, and testicular pain.  He was recently evaluated for renal colic, and work-up was negative.  Differential diagnosis was expanded today to include possible pulmonary source, hepatobiliary source.  Scrotal ultrasound was performed to evaluate for epididymitis or other pathology. Final Clinical Impression(s) / ED Diagnoses Final diagnoses:  Abdominal pain    Rx / DC Orders ED Discharge Orders     None        Arnaldo Natal, MD  04/20/21 1738  

## 2021-04-20 NOTE — ED Notes (Signed)
Pt states he has not had blood pressure meds today

## 2021-04-20 NOTE — ED Notes (Signed)
Pt refusing to allow RN to start IV without something to calm him down. Ativan PO 2 mg given per Dr. Joya Gaskins.

## 2021-04-20 NOTE — Telephone Encounter (Signed)
Refill sent.

## 2021-04-20 NOTE — Telephone Encounter (Signed)
Patient is requesting prescription for hydralazine '50mg'$  90 day supply = 270 tablet be sent to [facility] (last sent to [facility] but he is out and needs at local pharmacy).

## 2021-04-21 ENCOUNTER — Encounter: Payer: Self-pay | Admitting: Family Medicine

## 2021-04-21 ENCOUNTER — Ambulatory Visit (INDEPENDENT_AMBULATORY_CARE_PROVIDER_SITE_OTHER): Payer: Medicare HMO | Admitting: *Deleted

## 2021-04-21 DIAGNOSIS — IMO0002 Reserved for concepts with insufficient information to code with codable children: Secondary | ICD-10-CM

## 2021-04-21 DIAGNOSIS — G4733 Obstructive sleep apnea (adult) (pediatric): Secondary | ICD-10-CM | POA: Diagnosis not present

## 2021-04-21 DIAGNOSIS — M1990 Unspecified osteoarthritis, unspecified site: Secondary | ICD-10-CM | POA: Diagnosis not present

## 2021-04-21 DIAGNOSIS — Z87891 Personal history of nicotine dependence: Secondary | ICD-10-CM | POA: Diagnosis not present

## 2021-04-21 DIAGNOSIS — R0609 Other forms of dyspnea: Secondary | ICD-10-CM

## 2021-04-21 DIAGNOSIS — F32A Depression, unspecified: Secondary | ICD-10-CM | POA: Diagnosis not present

## 2021-04-21 DIAGNOSIS — M5441 Lumbago with sciatica, right side: Secondary | ICD-10-CM | POA: Diagnosis not present

## 2021-04-21 DIAGNOSIS — I10 Essential (primary) hypertension: Secondary | ICD-10-CM

## 2021-04-21 DIAGNOSIS — E1142 Type 2 diabetes mellitus with diabetic polyneuropathy: Secondary | ICD-10-CM | POA: Diagnosis not present

## 2021-04-21 DIAGNOSIS — Z9181 History of falling: Secondary | ICD-10-CM | POA: Diagnosis not present

## 2021-04-21 DIAGNOSIS — E1140 Type 2 diabetes mellitus with diabetic neuropathy, unspecified: Secondary | ICD-10-CM | POA: Diagnosis not present

## 2021-04-21 DIAGNOSIS — K589 Irritable bowel syndrome without diarrhea: Secondary | ICD-10-CM | POA: Diagnosis not present

## 2021-04-21 DIAGNOSIS — E785 Hyperlipidemia, unspecified: Secondary | ICD-10-CM | POA: Diagnosis not present

## 2021-04-21 DIAGNOSIS — E1165 Type 2 diabetes mellitus with hyperglycemia: Secondary | ICD-10-CM | POA: Diagnosis not present

## 2021-04-21 DIAGNOSIS — E7800 Pure hypercholesterolemia, unspecified: Secondary | ICD-10-CM | POA: Diagnosis not present

## 2021-04-21 DIAGNOSIS — I2510 Atherosclerotic heart disease of native coronary artery without angina pectoris: Secondary | ICD-10-CM | POA: Diagnosis not present

## 2021-04-21 DIAGNOSIS — N39498 Other specified urinary incontinence: Secondary | ICD-10-CM | POA: Diagnosis not present

## 2021-04-21 DIAGNOSIS — E1121 Type 2 diabetes mellitus with diabetic nephropathy: Secondary | ICD-10-CM | POA: Diagnosis not present

## 2021-04-21 DIAGNOSIS — G8929 Other chronic pain: Secondary | ICD-10-CM | POA: Diagnosis not present

## 2021-04-21 DIAGNOSIS — Z794 Long term (current) use of insulin: Secondary | ICD-10-CM | POA: Diagnosis not present

## 2021-04-21 DIAGNOSIS — R2689 Other abnormalities of gait and mobility: Secondary | ICD-10-CM

## 2021-04-21 DIAGNOSIS — Z6841 Body Mass Index (BMI) 40.0 and over, adult: Secondary | ICD-10-CM | POA: Diagnosis not present

## 2021-04-21 DIAGNOSIS — J849 Interstitial pulmonary disease, unspecified: Secondary | ICD-10-CM | POA: Diagnosis not present

## 2021-04-21 DIAGNOSIS — R55 Syncope and collapse: Secondary | ICD-10-CM

## 2021-04-21 DIAGNOSIS — R0600 Dyspnea, unspecified: Secondary | ICD-10-CM

## 2021-04-21 DIAGNOSIS — E1136 Type 2 diabetes mellitus with diabetic cataract: Secondary | ICD-10-CM | POA: Diagnosis not present

## 2021-04-21 DIAGNOSIS — F410 Panic disorder [episodic paroxysmal anxiety] without agoraphobia: Secondary | ICD-10-CM | POA: Diagnosis not present

## 2021-04-21 DIAGNOSIS — F988 Other specified behavioral and emotional disorders with onset usually occurring in childhood and adolescence: Secondary | ICD-10-CM | POA: Diagnosis not present

## 2021-04-21 DIAGNOSIS — N401 Enlarged prostate with lower urinary tract symptoms: Secondary | ICD-10-CM | POA: Diagnosis not present

## 2021-04-21 NOTE — Patient Instructions (Signed)
Visit Information  PATIENT GOALS:  Goals Addressed               This Visit's Progress     Receive Long-Term Placement in an [place]. (pt-stated)   On track     Timeframe:  Short-Range Goal Priority:  High Start Date:  03/30/2021                        Expected End Date: 05/30/2021  Follow-Up Plan:  05/01/2021 at 3:00pm  Patient Goals/Self-Care Activities: Encouraged completion of application for [place] Medicaid, notifying LCSW directly (# [phone]) if you need assistance with application completion and/or submission. Continued review of long-term care assisted living facilities, and be prepared to provide LCSW with at least 4 facilities of interest, during next scheduled telephone outreach call. Insurance account manager Services [phone]) to see if you are eligible for case management services to help direct and manage long-term care and support in the home.        Patient verbalizes understanding of instructions provided today and agrees to view in [place].   Telephone follow-up appointment with care management team member scheduled for:  05/01/2021 at 3:00pm  [place] Licensed Clinical Social Worker Dixie Lehr [phone]

## 2021-04-21 NOTE — Chronic Care Management (AMB) (Signed)
Chronic Care Management    Clinical Social Work Note  04/21/2021 Name: Tyler Brewer MRN: [id number] DOB: 12/23/1960  Tyler Brewer is a 60 y.o. year old male who is a primary care patient of [name], DO. The CCM team was consulted to assist the patient with chronic disease management and/or care coordination needs related to: Level of Care Concerns.   Engaged with patient by telephone for follow-up visit in response to provider referral for social work chronic care management and care coordination services.   Consent to Services:  The patient was given information about Chronic Care Management services, agreed to services, and gave verbal consent prior to initiation of services.  Please see initial visit note for detailed documentation.   Patient agreed to services and consent obtained.   Assessment: Review of patient past medical history, allergies, medications, and health status, including review of relevant consultants reports was performed today as part of a comprehensive evaluation and provision of chronic care management and care coordination services.     SDOH (Social Determinants of Health) assessments and interventions performed:    Advanced Directives Status: Not addressed in this encounter.  CCM Care Plan  No Known Allergies  Outpatient Encounter Medications as of 04/21/2021  Medication Sig Note   acetaminophen (TYLENOL) 500 MG tablet Take 500-1,000 mg by mouth every 6 (six) hours as needed for mild pain.    amLODipine (NORVASC) 10 MG tablet Take 1 tablet (10 mg total) by mouth daily.    COMFORT EZ INSULIN SYRINGE 31G X 5/16" 1 ML MISC     Cyanocobalamin (VITAMIN B-12 PO) Take 1 tablet by mouth daily.    diclofenac Sodium (VOLTAREN) 1 % GEL Apply 4 g topically 4 (four) times daily as needed (joint pain).    furosemide (LASIX) 20 MG tablet TAKE 1 TABLET(20 MG) BY MOUTH DAILY (Patient taking differently: Take 40 mg by mouth daily.)    gabapentin (NEURONTIN)  300 MG capsule Take 300 mg by mouth 2 (two) times daily. 04/19/2021: Per patient he is tapering off gabapentin and will only take pregabalin    hydrALAZINE (APRESOLINE) 50 MG tablet Take 1 tablet (50 mg total) by mouth every 8 (eight) hours.    ibuprofen (ADVIL) 200 MG tablet Take 400 mg by mouth every 6 (six) hours as needed for mild pain.    insulin NPH Human (NOVOLIN N) 100 UNIT/ML injection Inject 35 Units into the skin at bedtime.    Insulin Pen Needle 31G X 5 MM MISC 1 Device by Does not apply route in the morning, at noon, in the evening, and at bedtime.    Insulin Regular Human (HUMULIN R) 100 UNIT/ML KwikPen Inject 10 Units into the skin 3 (three) times daily before meals. Uses [employer] Relion R insulin    losartan (COZAAR) 100 MG tablet TAKE ONE TABLET BY MOUTH DAILY (Patient taking differently: Take 100 mg by mouth daily.)    MAGNESIUM PO Take 1 tablet by mouth daily.    meclizine (ANTIVERT) 25 MG tablet Take 1 tablet (25 mg total) by mouth 3 (three) times daily as needed for dizziness.    meloxicam (MOBIC) 15 MG tablet Take 1 tablet (15 mg total) by mouth daily.    Menthol, Topical Analgesic, (ICY HOT EX) Apply 1 application topically as needed (pain).    metFORMIN (GLUCOPHAGE-XR) 500 MG 24 hr tablet Take 2 tablets (1,000 mg total) by mouth in the morning and at bedtime.    methocarbamol (ROBAXIN) 500 MG tablet Take  500 mg by mouth 4 (four) times daily as needed for muscle spasms.    Multiple Vitamin (MULTIVITAMIN) tablet Take 1 tablet by mouth daily.    pravastatin (PRAVACHOL) 40 MG tablet Take 1 tablet (40 mg total) by mouth daily.    pregabalin (LYRICA) 75 MG capsule Take 1 capsule (75 mg total) by mouth 2 (two) times daily.    tamsulosin (FLOMAX) 0.4 MG CAPS capsule TAKE ONE CAPSULE BY MOUTH ONE TIME DAILY (Patient taking differently: Take 0.4 mg by mouth daily.)    traMADol (ULTRAM) 50 MG tablet 2 po q6 h prn (Patient taking differently: Take 100 mg by mouth every 6 (six) hours as  needed for moderate pain.)    No facility-administered encounter medications on file as of 04/21/2021.    Patient Active Problem List   Diagnosis Date Noted   CIDP (chronic inflammatory demyelinating polyneuropathy) ([place]) 01/12/2021   Diabetes mellitus ([place]) 01/03/2021   Type 2 diabetes mellitus with hyperglycemia, with long-term current use of insulin ([place]) 01/03/2021   Lower extremity edema 11/28/2020   Chronic low back pain 11/28/2020   Psoriatic arthritis ([place]) 11/28/2020   Thoracic aortic aneurysm without rupture ([place]) 11/28/2020   Bilateral leg weakness 11/05/2020   Acute left-sided low back pain with right-sided sciatica 07/19/2020   Hip pain, acute, left 07/05/2020   Tremor 07/05/2020   Weakness 07/05/2020   Balance problem 03/11/2020   Tinea cruris 03/11/2020   Cellulitis of left groin 03/11/2020   Acute pain of right shoulder 03/11/2020   Interstitial lung disease ([place]) 05/02/2017   Pansinusitis 09/26/2016   Diabetic polyneuropathy associated with diabetes mellitus due to underlying condition ([place]) 05/08/2016   Methamphetamine use disorder, severe, dependence ([place]) 02/11/2016   Substance induced mood disorder ([place]) 02/11/2016   Exertional dyspnea 11/30/2015   ADD (attention deficit disorder) 09/29/2015   Binge eating 09/29/2015   Syncope 05/05/2012   Diarrhea 05/05/2012   Panic attacks 05/05/2012   OSA (obstructive sleep apnea) 05/05/2012   HTN (hypertension) 05/05/2012   Dyslipidemia 05/05/2012   CAD (coronary artery disease)  cardiac cath with moderate disease in a septal branch of the ramus intermedius 04/01/2012   Chest pain 04/01/2012   Drug abuse and dependence ([place]) 04/01/2012   Family history of coronary artery disease 03/31/2012   Sleep apnea, on C-pap 03/31/2012   Hyperlipemia 03/30/2012   HTN (hypertension), benign 03/30/2012   Morbid obesity ([place]) 03/30/2012   DM type 2, uncontrolled, with neuropathy ([place]) 03/30/2012   Depression with suicidal  ideation 03/30/2012    Conditions to be addressed/monitored: HTN and DMII.  Limited Social Support, Housing Barriers, Level of Care Concerns, ADL/IADL Limitations, Social Isolation, and Limited Access to Caregiver.  Care Plan : LCSW Plan of Care  Updates made by Francis Gaines, LCSW since 04/21/2021 12:00 AM     Problem: Receive Long-Term Placement in an [place].   Priority: High     Goal: Receive Long-Term Placement in an [place].   Start Date: 03/30/2021  Expected End Date: 05/30/2021  This Visit's Progress: On track  Recent Progress: On track  Priority: High  Note:   Current Barriers:   Patient with History of Falls/Fall Risk, Unsteady Gait, Impaired Mobility, Weakness, Tremor, Syncope, Balance Problem and Lower Extremity Edema needs assistance with initiating short-term rehabilitative services in a skilled nursing facility. Patient's health has declined, he is experiencing memory deficits, and he is unable to consistently perform ADL's/IADL's. Clinical Goal(s):  Patient will receive assistance with initiating  long-term placement in an assisted living facility. Interventions: Collaboration with Primary Care Physician, Dr. [name] regarding development and update of comprehensive plan of care as evidenced by provider attestation and co-signature. Inter-disciplinary care team collaboration (see longitudinal plan of care). Assessed needs and provided education on level of care and facility placement process. Encouraged patient to apply for [place] Medicaid, through the [place], mailing application on [id number]. Encouraged patient to review list of long-term care assisted living facilities in [facility], mailed to home on 04/13/2021, deciding on at least 4 facilities of interest. Discussed plans with patient for ongoing care management follow-up and provided patient with  direct contact information for care management team. Discussed [place] and provided contact information. LCSW will continue to fax out [place]-2 Form to try and pursue bed offers. Patient Goals/Self-Care Activities: Encouraged completion of application for [place] Medicaid, notifying LCSW directly (# [phone]) if you need assistance with application completion and/or submission. Continued review of long-term care assisted living facilities, and be prepared to provide LCSW with at least 4 facilities of interest, during next scheduled telephone outreach call. Insurance account manager Services [phone]) to see if you are eligible for case management services to help direct and manage long-term care and support in the home. Follow-Up:  05/01/2021 at 3:00pm    Follow-Up Plan:  05/01/2021 at 3:00pm      Eleanor Clinical Social Worker [place] [phone]

## 2021-04-25 ENCOUNTER — Encounter: Payer: Self-pay | Admitting: Family Medicine

## 2021-04-25 ENCOUNTER — Ambulatory Visit: Payer: Medicare HMO | Admitting: Pharmacist

## 2021-04-25 DIAGNOSIS — E1140 Type 2 diabetes mellitus with diabetic neuropathy, unspecified: Secondary | ICD-10-CM

## 2021-04-25 DIAGNOSIS — E785 Hyperlipidemia, unspecified: Secondary | ICD-10-CM

## 2021-04-25 DIAGNOSIS — IMO0002 Reserved for concepts with insufficient information to code with codable children: Secondary | ICD-10-CM

## 2021-04-25 DIAGNOSIS — I10 Essential (primary) hypertension: Secondary | ICD-10-CM

## 2021-04-25 NOTE — Patient Instructions (Signed)
Visit Information  PATIENT GOALS:  Goals Addressed             This Visit's Progress    Chronic Care Management Pharmacy Care Plan   On track    Current Barriers:  Unable to independently afford treatment regimen Unable to independently monitor therapeutic efficacy Unable to achieve control of type 2 diabetes and high blood pressure / hypertension Does not adhere to prescribed medication regimen Frequent ED visits  Pharmacist Clinical Goal(s):  Over the next 45 days, patient will verbalize ability to afford treatment regimen achieve adherence to monitoring guidelines and medication adherence to achieve therapeutic efficacy achieve control of type 2 diabetes and high blood pressure / hypertension as evidenced by attainment of goals listed below Decrease frequency of ED visits through collaboration with PharmD and provider.   Interventions: 1:1 collaboration with Carollee Herter, Alferd Apa, DO regarding development and update of comprehensive plan of care as evidenced by provider attestation and co-signature Inter-disciplinary care team collaboration (see longitudinal plan of care) Comprehensive medication review performed; medication list updated in electronic medical record    Diabetes: Lab Results  Component Value Date   HGBA1C 7.7 (H) 02/14/2021  A1c 03/23/2021 at [facility] was 6.9%  Controlled - (goal A1c <7%) Much improved BG since started using Libre and taking insuiln regularly States cost of insulin still difficult but he cannot find application I sent for patient assistnace.  Current treatment Novolin NPH 35 units once daily  Novolin Regular insulin 14 units three times a day with meals (increased this morning by Dr [name])  Metformin XR '500mg'$  - 2 Tablets with Breakfast and 2 tablets with Supper Interventions / Recommendations Recommended continue to take medications as directed by Dr [name] Continue to check BG frequently during day with [facility]. Reminded patient to completed patient portion of [employer] PAP application and return to Primary Care office.   Patient was denied LIS but he forgot to bring to letter to office.   Hypertension: BP Readings from Last 3 Encounters:  04/20/21 (!) 175/117  04/12/21 (!) 148/85  03/21/21 114/72  Blood pressure not consistently at goal - BP goal <140/90 Current treatment: Amlodipine '10mg'$  daily  Hydralazine '50mg'$  every 8 hours Losartan '100mg'$  daily  Interventions: (addressed at previous visits)  Counseled on importance of getting blood pressure to goal to prevent strokes and kidney damage Working to transition generic maintenance medications over [employer] order pharmacy.  Hyperlipidemia / Heart Disease: Lipid Panel     Component Value Date/Time   CHOL 118 11/28/2020 1502   TRIG 130.0 11/28/2020 1502   HDL 40.00 11/28/2020 1502   CHOLHDL 3 11/28/2020 1502   VLDL 26.0 11/28/2020 1502   LDLCALC 52 11/28/2020 1502  Controlled - LDL goal <70 Current treatment: Pravastatin '40mg'$  at bedtime Interventions:  Counseled on LDL goals Recommended continue current regimen for cholesterol  Chronic Pain / Leg Weakness: Currently not controlled Patient has had several ED visits for pain and weakness in the last month. Has appointment with pain management 02/16/2021. Current treatment:  Tramadol '50mg'$  - take 1 or 2 tablets up to every 8 hours as needed Methocarbamol '500mg'$  - take 1 tablet 4 times a day as needed for muscle spasms Voltaren Gel 1% -  apply to area of pain up to 4 times a day as needed Prednisone  Interventions:  Continue current regimen for pain  Encouraged patient to follow up with neurology Ensure transportation has been set up for pain management appointment 02/16/2021  Continue with plan for testing by neurology  Medication Management:  Refill history shows that patient has not refilled hydralazine, metformin on time.  Patient also contact clinical pharmacist asking  for assistance with medication refills from [facility] mail order. He tried to order medications but was told that he had meds ready to pick up at his local pharmacy.  [facility] had auto filled the following meds -  Interventions:  Coordinated with [facility] to reverse medications that were auto filled - Novolog, Tresiba, amlodipine, duloxetine, tamsulosin and losartan Coordinated getting amlodipine and hydralazine filled at [facility]. You will need to request delivery at [email or web address];  Coordinated refills at [facility] for the following medications: pravastatin, tamsulosin, metformin and losartan. You should receive the following medication in the next 5 to 7 days in the mail: Pravastatin, tamsulosin, metformin and losartan. When you have about 1 month left, you can request refills again at this number [phone] Important to keep regular check-in phone visits so that we can order / request refills BEFORE you run out of medication.    Patient Goals/Self-Care Activities Over the next 90 days, patient will:  take medications as prescribed focus on medication adherence by utilizing pharmacy service that delivers medications to his home, check glucose before meals and 2 hour after meals using CGM, document, and provide at future appointments collaborate with provider on medication access solutions   Follow up call planned In 2 weeks         The patient verbalized understanding of instructions, educational materials, and care plan provided today and declined offer to receive copy of patient instructions, educational materials, and care plan.   Telephone follow up appointment with care management team member scheduled for: 2 weeks  Cherre Robins, PharmD Clinical Pharmacist [facility] Primary [facility]

## 2021-04-25 NOTE — Chronic Care Management (AMB) (Signed)
Chronic Care Management Pharmacy Note  04/25/2021 Name:  Tyler Brewer MRN:  [id number] DOB:  Nov 22, 1960  Subjective: Tyler Brewer is an 60 y.o. year old male who is a primary patient of [name], DO.  The CCM team was consulted for assistance with disease management and care coordination needs.    Engaged with patient by telephone for  assistance with refilling medication  in response to provider referral for pharmacy case management and/or care coordination services.  Tyler Brewer has difficulty with transportation and also ambulation. He requested all prescriptions to be filled either at [facility] or through [employer] order. Patient reached out to clinical pharmacist because when he tried to order prescriptions from [facility], he was told they were at his local pharmacy. He was frustrated and asked for assistance.  Patient endorses today that he has received all needed medications except amlodipine and states that [facility] is going to deliver amlodipine either today or tomorrow.  See Care notes below for more information.   Consent to Services:  The patient was given information about Chronic Care Management services, agreed to services, and gave verbal consent prior to initiation of services.  Please see initial visit note for detailed documentation.   Patient Care Team: Carollee Herter, Alferd Apa, DO as PCP - General (Family Medicine) Alda Berthold, DO as Consulting Physician (Neurology) Lajuana Matte, MD as Consulting Physician (Cardiothoracic Surgery) Marlaine Hind, MD as Consulting Physician (Physical Medicine and Rehabilitation) Luretha Rued, RN as Case Manager Cherre Robins, PharmD (Pharmacist) [facility], Melanie Crazier, MD as Consulting Physician (Endocrinology) Saporito, Maree Erie, LCSW as Social Worker (Licensed Clinical Social Worker)  Recent office visits: 01/12/2021 - PCP (Dr Etter Sjogren) F/U visit; recommended COVID vaccine and colonoscopy; no  medication changes.  11/28/2020 - PCP (Dr Etter Sjogren) Hospital f/u; c/o LEE; Refilled furosemide 23m take 1 tablet daily and tramadol 586m1 or 2 tablet every 8 hours. Referred to Endo for DM.   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 No medication changes.   03/24/2021 - Neurology ([facility]. Repeat EMG/NCS to assess for diabetic amyotrophy versus demyelinating process. Labs checked. No med changes. F/U 3 months.   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 No medication changes. Recommended optimize control of DM and physical therapy to improve strength.   01/03/2021 - Endo (Dr ShKelton PillarInitial consult for insulin dependent type 2 DM. Started Metformin 1 tablet daily with Breakfast for 1 week, then increase to 1 tablet with Breakfast and 1 tablet with Supper for  1 week, then increase to 2 tablets with Breakfast  and 1 tablet with Supper for another 1 week , then finally 2 Tablets with Breakfast and 2 tablets with Supper.  Increased Tresiba to 45 units daily. Increase Novolog to 12 units with each meal     Hospital visits: [facility]/20/2021 - [facility] Performed scrotal U/S to r/o epididymitis or other pathology. Only showed small bilateral hydroceles. No med changes.  [place] [facility] [place] Noted also to have urinary retention. MRI showed no changes or spinal cord compression. GIven tramadol, lorazepam and meloxicam in hospital. Patient was noted to be upset at dicharge because he wanted to be admitted. 03/16/2021 - ED Visit - for neuropathy - no med  changes.  01/23/2021 - ED Visit - acute pain in left knee given oxycodone / APAP for 1 dose and IM toradol 01/21/2021 - Ed Visit - left without being seen [facility]) 01/20/2021 - ED Visit - abdominal pain; GIven GI cocktail with improved symptoms. No change to medication regimen 01/16/2021 - ED Visit - leg pain; Given ketorolac 40m IM; oxycodone 192mx 1 dose; prednisone 6044m 1 dose and Rx to start methylprednisolone dose pack   Objective:  Lab Results  Component Value Date   CREATININE 0.70 04/20/2021   CREATININE 0.79 04/12/2021   CREATININE 0.82 03/16/2021    Lab Results  Component Value Date   HGBA1C 7.7 (H) 02/14/2021   Last diabetic Eye exam: No results found for: HMDIABEYEEXA  Last diabetic Foot exam: No results found for: HMDIABFOOTEX      Component Value Date/Time   CHOL 136 02/14/2021 1419   TRIG 102.0 02/14/2021 1419   HDL 55.50 02/14/2021 1419   CHOLHDL 2 02/14/2021 1419   VLDL 20.4 02/14/2021 1419   LDLCALC 60 02/14/2021 1419   LDLDIRECT 124.0 05/02/2017 1507    Hepatic Function Latest Ref Rng & Units 04/20/2021 04/12/2021 03/16/2021  Total Protein 6.5 - 8.1 g/dL 6.6 7.0 6.8  Albumin 3.5 - 5.0 g/dL 3.1(L) 3.8 3.8  AST 15 - 41 U/L 28 13(L) 22  ALT 0 - 44 U/L '23 20 29  ' Alk Phosphatase 38 - 126 U/L 78 74 76  Total Bilirubin 0.3 - 1.2 mg/dL 1.1 0.3 0.5    Lab Results  Component Value Date/Time   TSH 2.57 02/14/2021 02:19 PM   TSH 2.948 11/05/2020 07:18 AM   TSH 2.66 03/10/2020 10:06 AM    CBC Latest Ref Rng & Units 04/20/2021 04/12/2021 03/16/2021  WBC 4.0 - 10.5 K/uL 5.8 6.3 6.0  Hemoglobin 13.0 - 17.0 g/dL 12.1(L) 13.9 13.4  Hematocrit 39.0 - 52.0 % 36.4(L) 40.6 39.0  Platelets 150 - 400 K/uL 300 276 243    No results found for: VD25OH  Clinical ASCVD: Yes  The 10-year ASCVD risk score (GoMikey Bussing Jr., et al., 2013) is: 25.7%   Values used to calculate the score:     Age: [age]     Sex: Male     Is Non-Hispanic African American: No     Diabetic: Yes     Tobacco smoker: Yes     Systolic Blood Pressure: 175527Hg     Is BP treated: Yes     HDL Cholesterol: 55.5 mg/dL     Total Cholesterol: 136 mg/dL      Social History   Tobacco Use  Smoking Status Every Day   Packs/day: 0.50   Types: Cigarettes   Last attempt to quit: 09/29/2020   Years since quitting: 0.5  Smokeless Tobacco Never   BP Readings from Last 3 Encounters:  04/20/21 (!) 175/117  04/12/21 (!) 148/85  03/21/21 114/72   Pulse Readings from Last 3 Encounters:  04/20/21 91  04/12/21 79  03/21/21 100   Wt Readings from Last 3 Encounters:  04/12/21 280 lb (127 kg)  03/21/21 280 lb (127 kg)  03/16/21 280 lb (127 kg)    Assessment: Review of patient past medical history, allergies, medications, health status, including review of consultants reports, laboratory and other test data, was performed as part of comprehensive evaluation and provision of chronic care management services.   SDOH:  (Social Determinants of Health) assessments and interventions performed:    CCMPort [place]  No Known Allergies  Medications Reviewed Today     Reviewed by Francis Gaines, LCSW (Social Worker) on 04/21/21 at 32  Med List Status: <None>   Medication Order Taking? Sig Documenting Provider Last Dose Status Informant  acetaminophen (TYLENOL) 500 MG tablet [id number] No Take 500-1,000 mg by mouth every 6 (six) hours as needed for mild pain. [provider] 04/19/2021 Active Self  amLODipine (NORVASC) 10 MG tablet [id number] No Take 1 tablet (10 mg total) by mouth daily. [name] R, DO 04/19/2021 Active Self  COMFORT EZ INSULIN SYRINGE 31G X 5/16" 1 ML MISC [id number] No  [provider] Taking Active Self  Cyanocobalamin (VITAMIN B-12 PO) [id number] No Take 1 tablet by mouth daily. [provider] 04/19/2021 Active Self  diclofenac Sodium (VOLTAREN) 1 % GEL [id number] No Apply 4 g topically 4 (four) times daily as needed (joint pain). [provider] 04/20/2021 Active Self  furosemide (LASIX) 20 MG tablet [id number] No TAKE 1 TABLET(20 MG) BY MOUTH DAILY  Patient taking differently: Take 40 mg  by mouth daily.   [name] R, DO 04/20/2021 Active Self           Med Note (RAKES, RACHEL J   Thu Apr 20, 2021  2:55 PM)    gabapentin (NEURONTIN) 300 MG capsule [id number] No Take 300 mg by mouth 2 (two) times daily. [provider] 04/20/2021 Active Self           Med Note Antony Contras, Jatavia Keltner B   Wed Apr 19, 2021  4:27 PM) Per patient he is tapering off gabapentin and will only take pregabalin   hydrALAZINE (APRESOLINE) 50 MG tablet [id number]  Take 1 tablet (50 mg total) by mouth every 8 (eight) hours. Carollee Herter, Kendrick Fries R, DO  Active   ibuprofen (ADVIL) 200 MG tablet [id number] No Take 400 mg by mouth every 6 (six) hours as needed for mild pain. [provider] 04/19/2021 Active Self  insulin NPH Human (NOVOLIN N) 100 UNIT/ML injection [id number] No Inject 35 Units into the skin at bedtime. [provider] 04/19/2021 Active Self  Insulin Pen Needle 31G X 5 MM MISC [id number] No 1 Device by Does not apply route in the morning, at noon, in the evening, and at bedtime. [name], Melanie Crazier, MD Taking Active Self  Insulin Regular Human (HUMULIN R) 100 UNIT/ML KwikPen [id number] No Inject 10 Units into the skin 3 (three) times daily before meals. Uses [employer] Relion R insulin [provider] 04/19/2021 Active Self  losartan (COZAAR) 100 MG tablet [id number] No TAKE ONE TABLET BY MOUTH DAILY  Patient taking differently: Take 100 mg by mouth daily.   [name] R, DO 04/19/2021 Active Self  MAGNESIUM PO [id number] No Take 1 tablet by mouth daily. [provider] 04/19/2021 Active Self  meclizine (ANTIVERT) 25 MG tablet [id number] No Take 1 tablet (25 mg total) by mouth 3 (three) times daily as needed for dizziness. Molpus, John, MD unk Active Self           Med Note Thayer Ohm, Debria Garret Apr 20, 2021  2:54 PM)    meloxicam (MOBIC) 15 MG tablet [id number] No Take 1 tablet (15 mg total) by mouth daily. Charlett Blake, MD 04/19/2021 Active Self   Menthol, Topical Analgesic, (ICY HOT EX) [id number] No Apply 1 application topically as needed (pain). [provider] 04/20/2021 Active Self  metFORMIN (GLUCOPHAGE-XR) 500 MG 24 hr tablet [id number] No Take 2 tablets (1,000  mg total) by mouth in the morning and at bedtime. [name] R, DO 04/19/2021 Active Self  methocarbamol (ROBAXIN) 500 MG tablet [id number] No Take 500 mg by mouth 4 (four) times daily as needed for muscle spasms. [provider] Past Week Active Self  Multiple Vitamin (MULTIVITAMIN) tablet [id number] No Take 1 tablet by mouth daily. [provider] 04/19/2021 Active Self  pravastatin (PRAVACHOL) 40 MG tablet [id number] No Take 1 tablet (40 mg total) by mouth daily. [name] R, DO 04/19/2021 Active Self  pregabalin (LYRICA) 75 MG capsule [id number] No Take 1 capsule (75 mg total) by mouth 2 (two) times daily. Charlett Blake, MD 04/19/2021 Active Self  tamsulosin (FLOMAX) 0.4 MG CAPS capsule [id number] No TAKE ONE CAPSULE BY MOUTH ONE TIME DAILY  Patient taking differently: Take 0.4 mg by mouth daily.   Carollee Herter, Kendrick Fries R, DO 04/19/2021 Active Self  traMADol (ULTRAM) 50 MG tablet [id number] No 2 po q6 h prn  Patient taking differently: Take 100 mg by mouth every 6 (six) hours as needed for moderate pain.   Carollee Herter, [place], DO 04/20/2021 Active Self           Med Note Thayer Ohm, RACHEL J   Thu Apr 20, 2021  2:53 PM)              Patient Active Problem List   Diagnosis Date Noted   CIDP (chronic inflammatory demyelinating polyneuropathy) ([place]) 01/12/2021   Diabetes mellitus ([place]) 01/03/2021   Type 2 diabetes mellitus with hyperglycemia, with long-term current use of insulin ([place]) 01/03/2021   Lower extremity edema 11/28/2020   Chronic low back pain 11/28/2020   Psoriatic arthritis ([place]) 11/28/2020   Thoracic aortic aneurysm without rupture ([place]) 11/28/2020   Bilateral leg weakness 11/05/2020   Acute left-sided low back pain  with right-sided sciatica 07/19/2020   Hip pain, acute, left 07/05/2020   Tremor 07/05/2020   Weakness 07/05/2020   Balance problem 03/11/2020   Tinea cruris 03/11/2020   Cellulitis of left groin 03/11/2020   Acute pain of right shoulder 03/11/2020   Interstitial lung disease ([place]) 05/02/2017   Pansinusitis 09/26/2016   Diabetic polyneuropathy associated with diabetes mellitus due to underlying condition ([place]) 05/08/2016   Methamphetamine use disorder, severe, dependence ([place]) 02/11/2016   Substance induced mood disorder ([place]) 02/11/2016   Exertional dyspnea 11/30/2015   ADD (attention deficit disorder) 09/29/2015   Binge eating 09/29/2015   Syncope 05/05/2012   Diarrhea 05/05/2012   Panic attacks 05/05/2012   OSA (obstructive sleep apnea) 05/05/2012   HTN (hypertension) 05/05/2012   Dyslipidemia 05/05/2012   CAD (coronary artery disease)  cardiac cath with moderate disease in a septal branch of the ramus intermedius 04/01/2012   Chest pain 04/01/2012   Drug abuse and dependence ([place]) 04/01/2012   Family history of coronary artery disease 03/31/2012   Sleep apnea, on C-pap 03/31/2012   Hyperlipemia 03/30/2012   HTN (hypertension), benign 03/30/2012   Morbid obesity ([place]) 03/30/2012   DM type 2, uncontrolled, with neuropathy ([place]) 03/30/2012   Depression with suicidal ideation 03/30/2012    Immunization History  Administered Date(s) Administered   Tdap 08/20/2014    Conditions to be addressed/monitored: CAD, HTN, HLD, DMII and chronic pain (low back) interstitial lung disease; OSA  Care Plan : General Pharmacy (Adult)  Updates made by Cherre Robins, PHARMD since 04/25/2021 12:00 AM     Problem: Medication management and Chronic Care Management, support and education for the following conditions -  HTN, Type2 DM with long term insuiln use; hyperlipidemia; neuropathy; OSA; institial lung disease;   Priority: High  Onset Date: 01/06/2021  Note:   Current Barriers:  Unable  to independently afford treatment regimen Unable to independently monitor therapeutic efficacy Unable to achieve control of type 2 DM and HTN - improved Does not adhere to prescribed medication regimen Frequent ED visits  Pharmacist Clinical Goal(s):  Over the next 45 days, patient will verbalize ability to afford treatment regimen achieve adherence to monitoring guidelines and medication adherence to achieve therapeutic efficacy achieve control of type 2 DM and HTN as evidenced by attainment of goals listed below Decrease frequency of ED visits  through collaboration with PharmD and provider.   Interventions: 1:1 collaboration with Carollee Herter, Alferd Apa, DO regarding development and update of comprehensive plan of care as evidenced by provider attestation and co-signature Inter-disciplinary care team collaboration (see longitudinal plan of care) Comprehensive medication review performed; medication list updated in electronic medical record   Interventions: 1:1 collaboration with Carollee Herter, Alferd Apa, DO regarding development and update of comprehensive plan of care as evidenced by provider attestation and co-signature Inter-disciplinary care team collaboration (see longitudinal plan of care) Comprehensive medication review performed; medication list updated in electronic medical record  Diabetes: Lab Results  Component Value Date   HGBA1C 7.7 (H) 02/14/2021  A1c 03/23/2021 at [facility] was 6.9%  Controlled - (goal A1c <7%) Much improved BG since started using [place] and taking insuiln regularly until recent change form Tresiba + Novolog on 04/17/2021 by endocrinologist to NPH + regular insulin due to insulin cost.  BG has been in 200 to 300's over the last week.  Current treatment: Relion NPH insulin- inject 35 units at bedtime Relion Regular insulin - inject 14 units prior to each meal.  (Increased from 10 units tid this morning)  Metformin XR 547m - 2 Tablets with Breakfast and  2 tablets with Supper (last refilled 04/20/2021 for 90 DS)  Interventions / Recommendations Recommended continue to take medications as directed by Dr SKelton PillarContinue to check BG frequently [facility] Reminded patient to complete [facility] assistance application and return to office.    Patient was denied LIS but he forgot to bring to letter to office. Reminded to bring with PAP application. Verified with patient [facility]   Hypertension: BP Readings from Last 3 Encounters:  04/20/21 (!) 175/117  04/12/21 (!) 148/85  03/21/21 114/72  BP improved but still not consistently at goal - BP goal <140/90 Not checking BP at home regularly Current treatment: Amlodipine 145mdaily  Hydralazine 5039mvery 8 hours Losartan 100m16mily  Interventions:  Counseled on importance of getting blood pressure to goal to prevent strokes and kidney damage Patient has been out of hydralazine - coordinated refills with [facility] at visit last week. Patient endorses that he received hydralazine last week and has been taking daily every 8 hours He also endorses that he [facility].   Hyperlipidemia / Heart Disease: Lipid Panel     Component Value Date/Time   CHOL 118 11/28/2020 1502   TRIG 130.0 11/28/2020 1502   HDL 40.00 11/28/2020 1502   CHOLHDL 3 11/28/2020 1502   VLDL 26.0 11/28/2020 1502   LDLCALC 52 11/28/2020 1502  Controlled - LDL goal <70 Current treatment: Pravastatin 40mg15mbedtime Interventions:  Counseled on LDL goals Recommended continue current regimen  for cholesterol - patient endorsed he received mail order rx for pravastatin today.   Chronic Pain / Leg Weakness: Currently not controlled Patient has had several ED visits for pain and weakness in the last month. Has appointment with pain management 02/16/2021. Current treatment:  Tramadol 31m -  take 1 or 2 tablets up to every 8 hours as needed Methocarbamol 5014m- take 1 tablet 4 times a day as needed for muscle spasms Voltaren Gel 1% -  apply to area of pain up to 4 times a day as needed Prednisone  Interventions:  Continue current regimen for pain  Encouraged patient to follow up with neurology Ensure transportation has been set up for pain management appointment 02/16/2021  Continue with plan for testing by neurology  Medication Management:  Refill history shows that patient has not refilled hydralazine, metformin on time.  Patient also contact clinical pharmacist asking for assistance with [facility]. He tried to order medications but was told that he had meds ready to pick up at his local pharmacy.  [facility] had auto filled the following meds - Novolog, Tresiba, amlodipine, duloxetine, tamsulosin and losartan Interventions:  Coordinated getting [facility] and [facility]azine [facility]; Per patient he has received hydralazine and expects amlodipine today [facility] medications: pravastatin, tamsulosin, metformin and losartan. Patient endorses that he received today.  Will continue to follow adherence and assist patient with refills.  Reminded patient that he needs to answer calls for regular checki-ns so that we can order / request refills BEFORE he runs out of medication.   Patient Goals/Self-Care Activities Over the next 90 days, patient will:  take medications as prescribed focus on medication adherence by utilizing pharmacy service that delivers medications to his home, check glucose before meals and 2 hour after meals using CGM, document, and provide at future appointments collaborate with provider on medication access solutions and refills   Follow up call planned In 2 weeks       Medication Assistance: Application for Novolog and  Tresiba  medication assistance program. in process.  Anticipated assistance start date 05/09/2021.  See plan of care for additional detail. Today patient reports that he is still completing application for Novo insulins .  [id number] [place] - [address] [place] [id number]: [phone]: [phone] [id number]: [phone]: [phone] Follow Up:  Patient agrees to Care Plan and Follow-up.  Plan: The care management team will reach out to the patient again in 2 weeks.   TaCherre RobinsPharmD Clinical Pharmacist [phone]

## 2021-04-26 DIAGNOSIS — E7800 Pure hypercholesterolemia, unspecified: Secondary | ICD-10-CM | POA: Diagnosis not present

## 2021-04-26 DIAGNOSIS — N50811 Right testicular pain: Secondary | ICD-10-CM | POA: Diagnosis not present

## 2021-04-26 DIAGNOSIS — N50812 Left testicular pain: Secondary | ICD-10-CM | POA: Diagnosis not present

## 2021-04-26 DIAGNOSIS — M6281 Muscle weakness (generalized): Secondary | ICD-10-CM | POA: Diagnosis not present

## 2021-04-26 DIAGNOSIS — G8929 Other chronic pain: Secondary | ICD-10-CM | POA: Diagnosis not present

## 2021-04-26 DIAGNOSIS — G629 Polyneuropathy, unspecified: Secondary | ICD-10-CM | POA: Diagnosis not present

## 2021-04-26 DIAGNOSIS — R69 Illness, unspecified: Secondary | ICD-10-CM | POA: Diagnosis not present

## 2021-04-26 DIAGNOSIS — I160 Hypertensive urgency: Secondary | ICD-10-CM | POA: Diagnosis not present

## 2021-04-26 DIAGNOSIS — Z743 Need for continuous supervision: Secondary | ICD-10-CM | POA: Diagnosis not present

## 2021-04-26 DIAGNOSIS — M545 Low back pain, unspecified: Secondary | ICD-10-CM | POA: Diagnosis not present

## 2021-04-26 DIAGNOSIS — R531 Weakness: Secondary | ICD-10-CM | POA: Diagnosis not present

## 2021-04-26 DIAGNOSIS — E1165 Type 2 diabetes mellitus with hyperglycemia: Secondary | ICD-10-CM | POA: Diagnosis not present

## 2021-04-26 DIAGNOSIS — E1142 Type 2 diabetes mellitus with diabetic polyneuropathy: Secondary | ICD-10-CM | POA: Diagnosis not present

## 2021-04-26 DIAGNOSIS — G6181 Chronic inflammatory demyelinating polyneuritis: Secondary | ICD-10-CM | POA: Diagnosis not present

## 2021-04-26 DIAGNOSIS — E785 Hyperlipidemia, unspecified: Secondary | ICD-10-CM | POA: Diagnosis not present

## 2021-04-26 DIAGNOSIS — I10 Essential (primary) hypertension: Secondary | ICD-10-CM | POA: Diagnosis not present

## 2021-04-26 DIAGNOSIS — Z299 Encounter for prophylactic measures, unspecified: Secondary | ICD-10-CM | POA: Diagnosis not present

## 2021-04-26 NOTE — Telephone Encounter (Signed)
Letter faxed.

## 2021-04-28 ENCOUNTER — Telehealth: Payer: Medicare HMO

## 2021-05-01 ENCOUNTER — Telehealth: Payer: Self-pay | Admitting: *Deleted

## 2021-05-01 ENCOUNTER — Telehealth: Payer: Medicare HMO | Admitting: *Deleted

## 2021-05-01 ENCOUNTER — Encounter: Payer: Self-pay | Admitting: Family Medicine

## 2021-05-01 NOTE — Telephone Encounter (Signed)
  Care Management   Follow Up Note   05/01/2021 Name: Tyler Brewer MRN: [id number] DOB: 1961/06/01   Referred by: [name], DO Reason for referral : Chronic Care Management in Patient with History of Falls/Fall Risk, Unsteady Gait, Type 2 DM, HTN, Hyperlipidemia, Chronic Pain.  LCSW received an incoming text message from patient this morning indicating that he will need to reschedule our telephone outreach call, scheduled for today (05/01/2021 at 3:00pm), as he is currently at [facility] undergoing a procedure.  LCSW agreed to follow-up with patient again next week.  Follow-Up Plan:  05/08/2021 at 9:45am  Nat Christen Palo Clinical Social Worker Shepherd Larson [phone]

## 2021-05-01 NOTE — Telephone Encounter (Signed)
Fyi.

## 2021-05-05 ENCOUNTER — Telehealth: Payer: Medicare HMO

## 2021-05-05 DIAGNOSIS — G629 Polyneuropathy, unspecified: Secondary | ICD-10-CM | POA: Diagnosis not present

## 2021-05-05 DIAGNOSIS — G6181 Chronic inflammatory demyelinating polyneuritis: Secondary | ICD-10-CM | POA: Diagnosis not present

## 2021-05-08 ENCOUNTER — Telehealth: Payer: Self-pay | Admitting: Family Medicine

## 2021-05-08 ENCOUNTER — Ambulatory Visit: Payer: Medicare HMO | Admitting: *Deleted

## 2021-05-08 DIAGNOSIS — Z9181 History of falling: Secondary | ICD-10-CM | POA: Diagnosis not present

## 2021-05-08 DIAGNOSIS — E1142 Type 2 diabetes mellitus with diabetic polyneuropathy: Secondary | ICD-10-CM | POA: Diagnosis not present

## 2021-05-08 DIAGNOSIS — R0600 Dyspnea, unspecified: Secondary | ICD-10-CM

## 2021-05-08 DIAGNOSIS — R0609 Other forms of dyspnea: Secondary | ICD-10-CM

## 2021-05-08 DIAGNOSIS — M1990 Unspecified osteoarthritis, unspecified site: Secondary | ICD-10-CM | POA: Diagnosis not present

## 2021-05-08 DIAGNOSIS — K589 Irritable bowel syndrome without diarrhea: Secondary | ICD-10-CM | POA: Diagnosis not present

## 2021-05-08 DIAGNOSIS — IMO0002 Reserved for concepts with insufficient information to code with codable children: Secondary | ICD-10-CM

## 2021-05-08 DIAGNOSIS — Z87891 Personal history of nicotine dependence: Secondary | ICD-10-CM | POA: Diagnosis not present

## 2021-05-08 DIAGNOSIS — G8929 Other chronic pain: Secondary | ICD-10-CM | POA: Diagnosis not present

## 2021-05-08 DIAGNOSIS — N401 Enlarged prostate with lower urinary tract symptoms: Secondary | ICD-10-CM | POA: Diagnosis not present

## 2021-05-08 DIAGNOSIS — Z794 Long term (current) use of insulin: Secondary | ICD-10-CM | POA: Diagnosis not present

## 2021-05-08 DIAGNOSIS — F32A Depression, unspecified: Secondary | ICD-10-CM | POA: Diagnosis not present

## 2021-05-08 DIAGNOSIS — E1121 Type 2 diabetes mellitus with diabetic nephropathy: Secondary | ICD-10-CM | POA: Diagnosis not present

## 2021-05-08 DIAGNOSIS — E785 Hyperlipidemia, unspecified: Secondary | ICD-10-CM

## 2021-05-08 DIAGNOSIS — J849 Interstitial pulmonary disease, unspecified: Secondary | ICD-10-CM | POA: Diagnosis not present

## 2021-05-08 DIAGNOSIS — N39498 Other specified urinary incontinence: Secondary | ICD-10-CM | POA: Diagnosis not present

## 2021-05-08 DIAGNOSIS — F988 Other specified behavioral and emotional disorders with onset usually occurring in childhood and adolescence: Secondary | ICD-10-CM | POA: Diagnosis not present

## 2021-05-08 DIAGNOSIS — E1136 Type 2 diabetes mellitus with diabetic cataract: Secondary | ICD-10-CM | POA: Diagnosis not present

## 2021-05-08 DIAGNOSIS — R2689 Other abnormalities of gait and mobility: Secondary | ICD-10-CM

## 2021-05-08 DIAGNOSIS — M545 Low back pain, unspecified: Secondary | ICD-10-CM

## 2021-05-08 DIAGNOSIS — I10 Essential (primary) hypertension: Secondary | ICD-10-CM

## 2021-05-08 DIAGNOSIS — Z6841 Body Mass Index (BMI) 40.0 and over, adult: Secondary | ICD-10-CM | POA: Diagnosis not present

## 2021-05-08 DIAGNOSIS — F410 Panic disorder [episodic paroxysmal anxiety] without agoraphobia: Secondary | ICD-10-CM | POA: Diagnosis not present

## 2021-05-08 DIAGNOSIS — E1140 Type 2 diabetes mellitus with diabetic neuropathy, unspecified: Secondary | ICD-10-CM

## 2021-05-08 DIAGNOSIS — E7800 Pure hypercholesterolemia, unspecified: Secondary | ICD-10-CM | POA: Diagnosis not present

## 2021-05-08 DIAGNOSIS — G4733 Obstructive sleep apnea (adult) (pediatric): Secondary | ICD-10-CM | POA: Diagnosis not present

## 2021-05-08 DIAGNOSIS — M5441 Lumbago with sciatica, right side: Secondary | ICD-10-CM | POA: Diagnosis not present

## 2021-05-08 DIAGNOSIS — I2510 Atherosclerotic heart disease of native coronary artery without angina pectoris: Secondary | ICD-10-CM | POA: Diagnosis not present

## 2021-05-08 NOTE — Chronic Care Management (AMB) (Signed)
Chronic Care Management    Clinical Social Work Note  05/08/2021 Name: Tyler Brewer MRN: [id number] DOB: 11/24/1960  Tyler Brewer is a 60 y.o. year old male who is a primary care patient of [name], DO. The CCM team was consulted to assist the patient with chronic disease management and/or care coordination needs related to: Level of Care Concerns.   Engaged with patient by telephone for follow-up visit in response to provider referral for social work chronic care management and care coordination services.   Consent to Services:  The patient was given information about Chronic Care Management services, agreed to services, and gave verbal consent prior to initiation of services.  Please see initial visit note for detailed documentation.   Patient agreed to services and consent obtained.   Assessment: Review of patient past medical history, allergies, medications, and health status, including review of relevant consultants reports was performed today as part of a comprehensive evaluation and provision of chronic care management and care coordination services.     SDOH (Social Determinants of Health) assessments and interventions performed:    Advanced Directives Status: Not addressed in this encounter.  CCM Care Plan  No Known Allergies  Outpatient Encounter Medications as of 05/08/2021  Medication Sig Note   acetaminophen (TYLENOL) 500 MG tablet Take 500-1,000 mg by mouth every 6 (six) hours as needed for mild pain.    amLODipine (NORVASC) 10 MG tablet Take 1 tablet (10 mg total) by mouth daily. (Patient not taking: Reported on 04/25/2021)    COMFORT EZ INSULIN SYRINGE 31G X 5/16" 1 ML MISC     Cyanocobalamin (VITAMIN B-12 PO) Take 1 tablet by mouth daily.    diclofenac Sodium (VOLTAREN) 1 % GEL Apply 4 g topically 4 (four) times daily as needed (joint pain).    furosemide (LASIX) 20 MG tablet TAKE 1 TABLET(20 MG) BY MOUTH DAILY (Patient taking differently: Take 40 mg  by mouth daily.)    gabapentin (NEURONTIN) 300 MG capsule Take 300 mg by mouth 2 (two) times daily. 04/19/2021: Per patient he is tapering off gabapentin and will only take pregabalin    hydrALAZINE (APRESOLINE) 50 MG tablet Take 1 tablet (50 mg total) by mouth every 8 (eight) hours.    ibuprofen (ADVIL) 200 MG tablet Take 400 mg by mouth every 6 (six) hours as needed for mild pain.    insulin NPH Human (NOVOLIN N) 100 UNIT/ML injection Inject 35 Units into the skin at bedtime.    Insulin Pen Needle 31G X 5 MM MISC 1 Device by Does not apply route in the morning, at noon, in the evening, and at bedtime.    Insulin Regular Human (HUMULIN R) 100 UNIT/ML KwikPen Inject 14 Units into the skin 3 (three) times daily before meals. Uses [employer] Relion R insulin    losartan (COZAAR) 100 MG tablet TAKE ONE TABLET BY MOUTH DAILY (Patient taking differently: Take 100 mg by mouth daily.)    MAGNESIUM PO Take 1 tablet by mouth daily.    meclizine (ANTIVERT) 25 MG tablet Take 1 tablet (25 mg total) by mouth 3 (three) times daily as needed for dizziness.    meloxicam (MOBIC) 15 MG tablet Take 1 tablet (15 mg total) by mouth daily.    Menthol, Topical Analgesic, (ICY HOT EX) Apply 1 application topically as needed (pain).    metFORMIN (GLUCOPHAGE-XR) 500 MG 24 hr tablet Take 2 tablets (1,000 mg total) by mouth in the morning and at bedtime.  methocarbamol (ROBAXIN) 500 MG tablet Take 500 mg by mouth 4 (four) times daily as needed for muscle spasms.    Multiple Vitamin (MULTIVITAMIN) tablet Take 1 tablet by mouth daily.    pravastatin (PRAVACHOL) 40 MG tablet Take 1 tablet (40 mg total) by mouth daily.    pregabalin (LYRICA) 75 MG capsule Take 1 capsule (75 mg total) by mouth 2 (two) times daily.    tamsulosin (FLOMAX) 0.4 MG CAPS capsule TAKE ONE CAPSULE BY MOUTH ONE TIME DAILY (Patient taking differently: Take 0.4 mg by mouth daily.)    traMADol (ULTRAM) 50 MG tablet 2 po q6 h prn (Patient taking differently:  Take 100 mg by mouth every 6 (six) hours as needed for moderate pain.)    No facility-administered encounter medications on file as of 05/08/2021.    Patient Active Problem List   Diagnosis Date Noted   CIDP (chronic inflammatory demyelinating polyneuropathy) ([place]) 01/12/2021   Diabetes mellitus ([place]) 01/03/2021   Type 2 diabetes mellitus with hyperglycemia, with long-term current use of insulin ([place]) 01/03/2021   Lower extremity edema 11/28/2020   Chronic low back pain 11/28/2020   Psoriatic arthritis ([place]) 11/28/2020   Thoracic aortic aneurysm without rupture ([place]) 11/28/2020   Bilateral leg weakness 11/05/2020   Acute left-sided low back pain with right-sided sciatica 07/19/2020   Hip pain, acute, left 07/05/2020   Tremor 07/05/2020   Weakness 07/05/2020   Balance problem 03/11/2020   Tinea cruris 03/11/2020   Cellulitis of left groin 03/11/2020   Acute pain of right shoulder 03/11/2020   Interstitial lung disease ([place]) 05/02/2017   Pansinusitis 09/26/2016   Diabetic polyneuropathy associated with diabetes mellitus due to underlying condition ([place]) 05/08/2016   Methamphetamine use disorder, severe, dependence ([place]) 02/11/2016   Substance induced mood disorder ([place]) 02/11/2016   Exertional dyspnea 11/30/2015   ADD (attention deficit disorder) 09/29/2015   Binge eating 09/29/2015   Syncope 05/05/2012   Diarrhea 05/05/2012   Panic attacks 05/05/2012   OSA (obstructive sleep apnea) 05/05/2012   HTN (hypertension) 05/05/2012   Dyslipidemia 05/05/2012   CAD (coronary artery disease)  cardiac cath with moderate disease in a septal branch of the ramus intermedius 04/01/2012   Chest pain 04/01/2012   Drug abuse and dependence ([place]) 04/01/2012   Family history of coronary artery disease 03/31/2012   Sleep apnea, on C-pap 03/31/2012   Hyperlipemia 03/30/2012   HTN (hypertension), benign 03/30/2012   Morbid obesity ([place]) 03/30/2012   DM type 2, uncontrolled, with neuropathy ([place])  03/30/2012   Depression with suicidal ideation 03/30/2012    Conditions to be addressed/monitored: HTN and DMII.  Limited Social Support, Housing Barriers, Level of Care Concerns, ADL/IADL Limitations, Limited Access to Caregiver, and Lacks Knowledge of [employer].  Care Plan : LCSW Plan of Care  Updates made by Francis Gaines, LCSW since 05/08/2021 12:00 AM     Problem: Receive Long-Term Placement in an [place].   Priority: High     Goal: Receive Long-Term Placement in an [place].   Start Date: 03/30/2021  Expected End Date: 05/30/2021  This Visit's Progress: On track  Recent Progress: On track  Priority: High  Note:   Current Barriers:   Patient with History of Falls/Fall Risk, Unsteady Gait, Impaired Mobility, Weakness, Tremor, Syncope, Balance Problem and Lower Extremity Edema needs assistance with initiating short-term rehabilitative services in a skilled nursing facility or long-term care in an assisted living facility. [employer] Medicare continues to deny coverage for assisted living  and/or skilled nursing facility placement.   Clinical Goal(s):  Patient will receive assistance with initiating placement, whether it be assisted or skilled level care.   Interventions: Collaboration with Primary Care Physician, Dr. [name] regarding development and update of comprehensive plan of care as evidenced by provider attestation and co-signature. Inter-disciplinary care team collaboration (see longitudinal plan of care). Patient Goals/Self-Care Activities: Follow-up with Consumer-Directed Attendant Support Services [phone]) to see if you have been approved for case management services to help direct and manage long-term care and support in the home. Hydrologist and Policies (# [phone]), [employer] Information Program [phone]), or go online to  [employer] to obtain education and assistance with changing insurance benefits during open enrollment, which runs from June 20, 2021 thru September 03, 2021. Please contact LCSW directly (# [phone]) if you have questions, need assistance, or if additional social work needs are identified between now and our next scheduled telephone outreach call. Follow-Up:  05/16/2021 at 11:45am      Follow-Up Plan:  05/16/2021 at 11:45am      [place] Worker [facility] [phone]

## 2021-05-08 NOTE — Patient Instructions (Signed)
Visit Information  PATIENT GOALS:  Goals Addressed               This Visit's Progress     Receive Long-Term Placement in an [place]. (pt-stated)   On track     Timeframe:  Short-Range Goal Priority:  High Start Date:  03/30/2021                        Expected End Date: 05/30/2021  Follow-Up Plan:  05/16/2021 at 11:45am  Patient Goals/Self-Care Activities: Follow-up with Consumer-Directed Attendant Support Services [phone]) to see if you have been approved for case management services to help direct and manage long-term care and support in the home. Hydrologist and Policies (# [phone]), [employer] Information Program [phone]), or go online to [employer]gov to obtain education and assistance with changing insurance benefits during open enrollment, which runs from June 20, 2021 thru September 03, 2021. Please contact LCSW directly (# [phone]) if you have questions, need assistance, or if additional social work needs are identified between now and our next scheduled telephone outreach call.        Patient verbalizes understanding of instructions provided today and agrees to view in [place].   Telephone follow up appointment with care management team member scheduled for:  05/16/2021 at 11:45am  [place] Licensed Clinical Social Worker [place] [phone]

## 2021-05-08 NOTE — Telephone Encounter (Signed)
Tanya from [facility] would like verbal orders for continuing nurse care at home for 1 w 3. She can be called back at [phone], and a voicemail can be left.

## 2021-05-08 NOTE — Telephone Encounter (Signed)
LMOM for Tanya w/ verbal orders.

## 2021-05-09 ENCOUNTER — Ambulatory Visit: Payer: Medicare HMO | Admitting: Internal Medicine

## 2021-05-09 DIAGNOSIS — F32A Depression, unspecified: Secondary | ICD-10-CM | POA: Diagnosis not present

## 2021-05-09 DIAGNOSIS — Z6841 Body Mass Index (BMI) 40.0 and over, adult: Secondary | ICD-10-CM | POA: Diagnosis not present

## 2021-05-09 DIAGNOSIS — Z87891 Personal history of nicotine dependence: Secondary | ICD-10-CM | POA: Diagnosis not present

## 2021-05-09 DIAGNOSIS — E1142 Type 2 diabetes mellitus with diabetic polyneuropathy: Secondary | ICD-10-CM | POA: Diagnosis not present

## 2021-05-09 DIAGNOSIS — K589 Irritable bowel syndrome without diarrhea: Secondary | ICD-10-CM | POA: Diagnosis not present

## 2021-05-09 DIAGNOSIS — Z9181 History of falling: Secondary | ICD-10-CM | POA: Diagnosis not present

## 2021-05-09 DIAGNOSIS — E1121 Type 2 diabetes mellitus with diabetic nephropathy: Secondary | ICD-10-CM | POA: Diagnosis not present

## 2021-05-09 DIAGNOSIS — G8929 Other chronic pain: Secondary | ICD-10-CM | POA: Diagnosis not present

## 2021-05-09 DIAGNOSIS — I2510 Atherosclerotic heart disease of native coronary artery without angina pectoris: Secondary | ICD-10-CM | POA: Diagnosis not present

## 2021-05-09 DIAGNOSIS — Z794 Long term (current) use of insulin: Secondary | ICD-10-CM | POA: Diagnosis not present

## 2021-05-09 DIAGNOSIS — E1136 Type 2 diabetes mellitus with diabetic cataract: Secondary | ICD-10-CM | POA: Diagnosis not present

## 2021-05-09 DIAGNOSIS — M1990 Unspecified osteoarthritis, unspecified site: Secondary | ICD-10-CM | POA: Diagnosis not present

## 2021-05-09 DIAGNOSIS — M5441 Lumbago with sciatica, right side: Secondary | ICD-10-CM | POA: Diagnosis not present

## 2021-05-09 DIAGNOSIS — N39498 Other specified urinary incontinence: Secondary | ICD-10-CM | POA: Diagnosis not present

## 2021-05-09 DIAGNOSIS — E7800 Pure hypercholesterolemia, unspecified: Secondary | ICD-10-CM | POA: Diagnosis not present

## 2021-05-09 DIAGNOSIS — J849 Interstitial pulmonary disease, unspecified: Secondary | ICD-10-CM | POA: Diagnosis not present

## 2021-05-09 DIAGNOSIS — F988 Other specified behavioral and emotional disorders with onset usually occurring in childhood and adolescence: Secondary | ICD-10-CM | POA: Diagnosis not present

## 2021-05-09 DIAGNOSIS — N401 Enlarged prostate with lower urinary tract symptoms: Secondary | ICD-10-CM | POA: Diagnosis not present

## 2021-05-09 DIAGNOSIS — F410 Panic disorder [episodic paroxysmal anxiety] without agoraphobia: Secondary | ICD-10-CM | POA: Diagnosis not present

## 2021-05-09 DIAGNOSIS — G4733 Obstructive sleep apnea (adult) (pediatric): Secondary | ICD-10-CM | POA: Diagnosis not present

## 2021-05-09 DIAGNOSIS — I10 Essential (primary) hypertension: Secondary | ICD-10-CM | POA: Diagnosis not present

## 2021-05-09 NOTE — Progress Notes (Deleted)
Name: Tyler Brewer  Age/ Sex: 60 y.o., male   MRN/ DOB: [id number], 03-27-1961     PCP: [name], DO   Reason for Endocrinology Evaluation: Type 2 Diabetes Mellitus  Initial Endocrine Consultative Visit: 01/03/2021    PATIENT IDENTIFIER: Tyler Brewer is a 60 y.o. male with a past medical history of T2DM, Dyslipidemia, CAD, OSA , chronic back pain and chronic inflammatory demyelinating polyneuropathy (CIPD). The patient has followed with Endocrinology clinic since 01/03/2021 for consultative assistance with management of his diabetes.  DIABETIC HISTORY:  Tyler Brewer was diagnosed with DM 2005, Has been on metformin without intolerance issues.[name] Kitchen His hemoglobin A1c has ranged from 7.3% in 2017, peaking at 11.5% in 2021.  Saw [facility] endocrinology in 2019  On his initial visit to our clinic his A1c was 9.9%, he was on basal/prandial insulin which we adjusted and started metformin    SUBJECTIVE:   During the last visit (01/03/2021): A1c 9.9% We adjusted MDI regimen and started Metformin   Today (05/09/2021): Tyler Brewer is here for a follow up on diabetes management.  He checks his blood sugars *** times daily, preprandial to breakfast and ***. The patient has *** had hypoglycemic episodes since the last clinic visit.     HOME DIABETES REGIMEN:  Metformin 500 mg XR 2 tabs BID  Tresiba 45 units daily  Novolog 12 units with each meal      Statin: yes ACE-I/ARB: yes Prior Diabetic Education: yes   METER DOWNLOAD SUMMARY: Date range evaluated: *** Fingerstick Blood Glucose Tests = *** Average Number Tests/Day = *** Overall Mean FS Glucose = *** Standard Deviation = ***  BG Ranges: Low = *** High = ***   Hypoglycemic Events/30 Days: BG < 50 = *** Episodes of symptomatic severe hypoglycemia = ***    DIABETIC COMPLICATIONS: Microvascular complications:  Neuropathy Denies: CKD Last Eye Exam: Completed 2020  Macrovascular complications:  CAD Denies:  CVA, PVD   HISTORY:  Past Medical History:  Past Medical History:  Diagnosis Date   Arthritis    CAD (coronary artery disease)  cardiac cath with moderate disease in a septal branch of the ramus intermedius 04/01/2012   Depression    Diabetes mellitus    poorly controlled by his report   History of narcotic addiction ([place])    past history of back pain   Hypercholesteremia    Hypertension    IBS (irritable bowel syndrome)    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[place])    Neuropathy    Obesity    Max weight was 390   OSA on CPAP    Panic attacks    Testosterone deficiency    Vertigo    Past Surgical History:  Past Surgical History:  Procedure Laterality Date   CARDIAC CATHETERIZATION     IR FLUORO GUIDE CV LINE RIGHT  11/10/2020   IR US GUIDE VASC ACCESS RIGHT  11/10/2020   LEFT HEART CATHETERIZATION WITH CORONARY ANGIOGRAM N/A 03/31/2012   Procedure: LEFT HEART CATHETERIZATION WITH CORONARY ANGIOGRAM;  Surgeon: Leonie Man, MD;  [facility];  Service: Cardiovascular;  Laterality: N/A;   Social History:  reports that he has been smoking cigarettes. He has been smoking an average of .5 packs per day. He has never used smokeless tobacco. He reports that he does not currently use drugs after having used the following drugs: Methamphetamines. He reports that he does not drink alcohol. Family History:  Family History  Problem Relation Age of Onset  Diabetes type II Father    Hypertension Father    Pancreatic disease Father 39       Deceased   Healthy Mother    Healthy Sister    Healthy Son    Healthy Daughter    Parkinson's disease Maternal Grandmother      HOME MEDICATIONS: Allergies as of 05/09/2021   No Known Allergies      Medication List        Accurate as of May 09, 2021  7:09 AM. If you have any questions, ask your nurse or doctor.          acetaminophen 500 MG tablet Commonly known as: TYLENOL Take 500-1,000 mg by mouth every 6 (six) hours as  needed for mild pain.   amLODipine 10 MG tablet Commonly known as: NORVASC Take 1 tablet (10 mg total) by mouth daily.   Comfort EZ Insulin Syringe 31G X 5/16" 1 ML Misc Generic drug: Insulin Syringe-Needle U-100   diclofenac Sodium 1 % Gel Commonly known as: VOLTAREN Apply 4 g topically 4 (four) times daily as needed (joint pain).   furosemide 20 MG tablet Commonly known as: LASIX TAKE 1 TABLET(20 MG) BY MOUTH DAILY What changed: See the new instructions.   gabapentin 300 MG capsule Commonly known as: NEURONTIN Take 300 mg by mouth 2 (two) times daily.   hydrALAZINE 50 MG tablet Commonly known as: APRESOLINE Take 1 tablet (50 mg total) by mouth every 8 (eight) hours.   ibuprofen 200 MG tablet Commonly known as: ADVIL Take 400 mg by mouth every 6 (six) hours as needed for mild pain.   ICY HOT EX Apply 1 application topically as needed (pain).   insulin NPH Human 100 UNIT/ML injection Commonly known as: NOVOLIN N Inject 35 Units into the skin at bedtime.   Insulin Pen Needle 31G X 5 MM Misc 1 Device by Does not apply route in the morning, at noon, in the evening, and at bedtime.   Insulin Regular Human 100 UNIT/ML KwikPen Commonly known as: HUMULIN R Inject 14 Units into the skin 3 (three) times daily before meals. Uses [employer] Relion R insulin   losartan 100 MG tablet Commonly known as: COZAAR TAKE ONE TABLET BY MOUTH DAILY   MAGNESIUM PO Take 1 tablet by mouth daily.   meclizine 25 MG tablet Commonly known as: ANTIVERT Take 1 tablet (25 mg total) by mouth 3 (three) times daily as needed for dizziness.   meloxicam 15 MG tablet Commonly known as: MOBIC Take 1 tablet (15 mg total) by mouth daily.   metFORMIN 500 MG 24 hr tablet Commonly known as: GLUCOPHAGE-XR Take 2 tablets (1,000 mg total) by mouth in the morning and at bedtime.   methocarbamol 500 MG tablet Commonly known as: ROBAXIN Take 500 mg by mouth 4 (four) times daily as needed for muscle  spasms.   multivitamin tablet Take 1 tablet by mouth daily.   pravastatin 40 MG tablet Commonly known as: PRAVACHOL Take 1 tablet (40 mg total) by mouth daily.   pregabalin 75 MG capsule Commonly known as: Lyrica Take 1 capsule (75 mg total) by mouth 2 (two) times daily.   tamsulosin 0.4 MG Caps capsule Commonly known as: FLOMAX TAKE ONE CAPSULE BY MOUTH ONE TIME DAILY   traMADol 50 MG tablet Commonly known as: ULTRAM 2 po q6 h prn What changed:  how much to take how to take this when to take this reasons to take this additional instructions   VITAMIN B-12  PO Take 1 tablet by mouth daily.         OBJECTIVE:   Vital Signs: There were no vitals taken for this visit.  Wt Readings from Last 3 Encounters:  04/12/21 280 lb (127 kg)  03/21/21 280 lb (127 kg)  03/16/21 280 lb (127 kg)     Exam: General: Pt appears well and is in NAD  Lungs: Clear with good BS bilat with no rales, rhonchi, or wheezes  Heart: RRR with normal S1 and S2 and no gallops; no murmurs; no rub  Abdomen: Normoactive bowel sounds, soft, nontender, without masses or organomegaly palpable  Extremities: No pretibial edema. No tremor. Normal strength and motion throughout. See detailed diabetic foot exam below.  Skin: Normal texture and temperature to palpation. No rash noted.  Neuro: MS is good with appropriate affect, pt is alert and Ox3    DM foot exam: Please see diabetic assessment flow-sheet detailed below:           DATA REVIEWED:  Lab Results  Component Value Date   HGBA1C 7.7 (H) 02/14/2021   HGBA1C 9.9 (A) 01/03/2021   HGBA1C 10.6 (H) 11/05/2020   Lab Results  Component Value Date   MICROALBUR <0.7 05/02/2017   LDLCALC 60 02/14/2021   CREATININE 0.70 04/20/2021   Lab Results  Component Value Date   MICRALBCREAT 0.9 05/02/2017     Lab Results  Component Value Date   CHOL 136 02/14/2021   HDL 55.50 02/14/2021   LDLCALC 60 02/14/2021   LDLDIRECT 124.0 05/02/2017    TRIG 102.0 02/14/2021   CHOLHDL 2 02/14/2021         ASSESSMENT / PLAN / RECOMMENDATIONS:   1) Type 2 Diabetes Mellitus, ***controlled, With Neuropathic and Macrovascular  complications - Most recent A1c of *** %. Goal A1c < 7.0 %.    Plan: MEDICATIONS: ***  EDUCATION / INSTRUCTIONS: BG monitoring instructions: Patient is instructed to check his blood sugars *** times a day, ***. Call [facility] if: BG persistently < 70 I reviewed the Rule of 15 for the treatment of hypoglycemia in detail with the patient. Literature supplied.    2) Diabetic complications:  Eye: Does not have known diabetic retinopathy.  Neuro/ Feet: Does  have known diabetic peripheral neuropathy .  Renal: Patient does not have known baseline CKD. He   is not on an ACEI/ARB at present.      F/U in ***    Signed electronically by: Mack Guise, MD  [facility] Endocrinology  [facility] [address] [place], [place] [address] Phone: [phone] FAX: [phone]   CC: [address] [place] [address] Phone: [phone]  Fax: [phone]  Return to Endocrinology clinic as below: Future Appointments  Date Time Provider [place]  05/09/2021  8:10 AM Graceanne Guin, Melanie Crazier, MD [facility]  05/16/2021 11:45 AM [facility] SW-CCM SOCIAL WRK [facility]  05/17/2021  2:30 PM [facility] CCM PHARMACIST [facility]

## 2021-05-10 ENCOUNTER — Telehealth: Payer: Self-pay | Admitting: Family Medicine

## 2021-05-10 NOTE — Telephone Encounter (Signed)
Verbal given 

## 2021-05-10 NOTE — Telephone Encounter (Signed)
Caller/Agency: Terri PiedraIleene HutchinsonBoone Master Number: [phone] Requesting OT/PT/Skilled Nursing/Social Work/Speech Therapy: Home Health PT Frequency:  1x week for 1 week 2x week for 1 week 1x week for 1 week

## 2021-05-11 ENCOUNTER — Ambulatory Visit: Payer: Medicare HMO | Admitting: Family Medicine

## 2021-05-11 ENCOUNTER — Encounter (HOSPITAL_BASED_OUTPATIENT_CLINIC_OR_DEPARTMENT_OTHER): Payer: Self-pay | Admitting: Emergency Medicine

## 2021-05-11 ENCOUNTER — Emergency Department (HOSPITAL_BASED_OUTPATIENT_CLINIC_OR_DEPARTMENT_OTHER): Payer: Medicare HMO

## 2021-05-11 ENCOUNTER — Emergency Department (HOSPITAL_BASED_OUTPATIENT_CLINIC_OR_DEPARTMENT_OTHER)
Admission: EM | Admit: 2021-05-11 | Discharge: 2021-05-12 | Disposition: A | Discharge status: Home / Self Care | Payer: Medicare HMO | Attending: Emergency Medicine | Admitting: Emergency Medicine

## 2021-05-11 DIAGNOSIS — M25551 Pain in right hip: Secondary | ICD-10-CM | POA: Insufficient documentation

## 2021-05-11 DIAGNOSIS — Y92009 Unspecified place in unspecified non-institutional (private) residence as the place of occurrence of the external cause: Secondary | ICD-10-CM | POA: Insufficient documentation

## 2021-05-11 DIAGNOSIS — S8255XA Nondisplaced fracture of medial malleolus of left tibia, initial encounter for closed fracture: Secondary | ICD-10-CM | POA: Insufficient documentation

## 2021-05-11 DIAGNOSIS — S3992XA Unspecified injury of lower back, initial encounter: Secondary | ICD-10-CM | POA: Diagnosis not present

## 2021-05-11 DIAGNOSIS — S79911A Unspecified injury of right hip, initial encounter: Secondary | ICD-10-CM | POA: Diagnosis not present

## 2021-05-11 DIAGNOSIS — M25561 Pain in right knee: Secondary | ICD-10-CM | POA: Diagnosis not present

## 2021-05-11 DIAGNOSIS — S39012A Strain of muscle, fascia and tendon of lower back, initial encounter: Secondary | ICD-10-CM | POA: Diagnosis not present

## 2021-05-11 DIAGNOSIS — S92415A Nondisplaced fracture of proximal phalanx of left great toe, initial encounter for closed fracture: Secondary | ICD-10-CM

## 2021-05-11 DIAGNOSIS — Z79899 Other long term (current) drug therapy: Secondary | ICD-10-CM | POA: Insufficient documentation

## 2021-05-11 DIAGNOSIS — Z87891 Personal history of nicotine dependence: Secondary | ICD-10-CM | POA: Diagnosis not present

## 2021-05-11 DIAGNOSIS — Z794 Long term (current) use of insulin: Secondary | ICD-10-CM | POA: Insufficient documentation

## 2021-05-11 DIAGNOSIS — Z743 Need for continuous supervision: Secondary | ICD-10-CM | POA: Diagnosis not present

## 2021-05-11 DIAGNOSIS — I10 Essential (primary) hypertension: Secondary | ICD-10-CM | POA: Diagnosis not present

## 2021-05-11 DIAGNOSIS — Z043 Encounter for examination and observation following other accident: Secondary | ICD-10-CM | POA: Diagnosis not present

## 2021-05-11 DIAGNOSIS — S99922A Unspecified injury of left foot, initial encounter: Secondary | ICD-10-CM | POA: Diagnosis present

## 2021-05-11 DIAGNOSIS — X501XXA Overexertion from prolonged static or awkward postures, initial encounter: Secondary | ICD-10-CM | POA: Insufficient documentation

## 2021-05-11 DIAGNOSIS — I2510 Atherosclerotic heart disease of native coronary artery without angina pectoris: Secondary | ICD-10-CM | POA: Diagnosis not present

## 2021-05-11 DIAGNOSIS — M549 Dorsalgia, unspecified: Secondary | ICD-10-CM | POA: Insufficient documentation

## 2021-05-11 DIAGNOSIS — E1140 Type 2 diabetes mellitus with diabetic neuropathy, unspecified: Secondary | ICD-10-CM | POA: Diagnosis not present

## 2021-05-11 DIAGNOSIS — S8991XA Unspecified injury of right lower leg, initial encounter: Secondary | ICD-10-CM | POA: Diagnosis not present

## 2021-05-11 DIAGNOSIS — Z7984 Long term (current) use of oral hypoglycemic drugs: Secondary | ICD-10-CM | POA: Insufficient documentation

## 2021-05-11 DIAGNOSIS — I119 Hypertensive heart disease without heart failure: Secondary | ICD-10-CM | POA: Insufficient documentation

## 2021-05-11 DIAGNOSIS — W19XXXA Unspecified fall, initial encounter: Secondary | ICD-10-CM | POA: Diagnosis not present

## 2021-05-11 DIAGNOSIS — M7989 Other specified soft tissue disorders: Secondary | ICD-10-CM | POA: Diagnosis not present

## 2021-05-11 DIAGNOSIS — E119 Type 2 diabetes mellitus without complications: Secondary | ICD-10-CM | POA: Insufficient documentation

## 2021-05-11 DIAGNOSIS — S92912A Unspecified fracture of left toe(s), initial encounter for closed fracture: Secondary | ICD-10-CM | POA: Diagnosis not present

## 2021-05-11 DIAGNOSIS — I878 Other specified disorders of veins: Secondary | ICD-10-CM | POA: Diagnosis not present

## 2021-05-11 HISTORY — DX: Chronic inflammatory demyelinating polyneuritis: G61.81

## 2021-05-11 IMAGING — DX DG ANKLE COMPLETE 3+V*L*
3 series · 3 of 3 positions shown · non-contrast
Comparison: None.

CLINICAL DATA: Status post fall.

EXAM:
LEFT ANKLE COMPLETE - 3+ VIEW

[ankle ap]
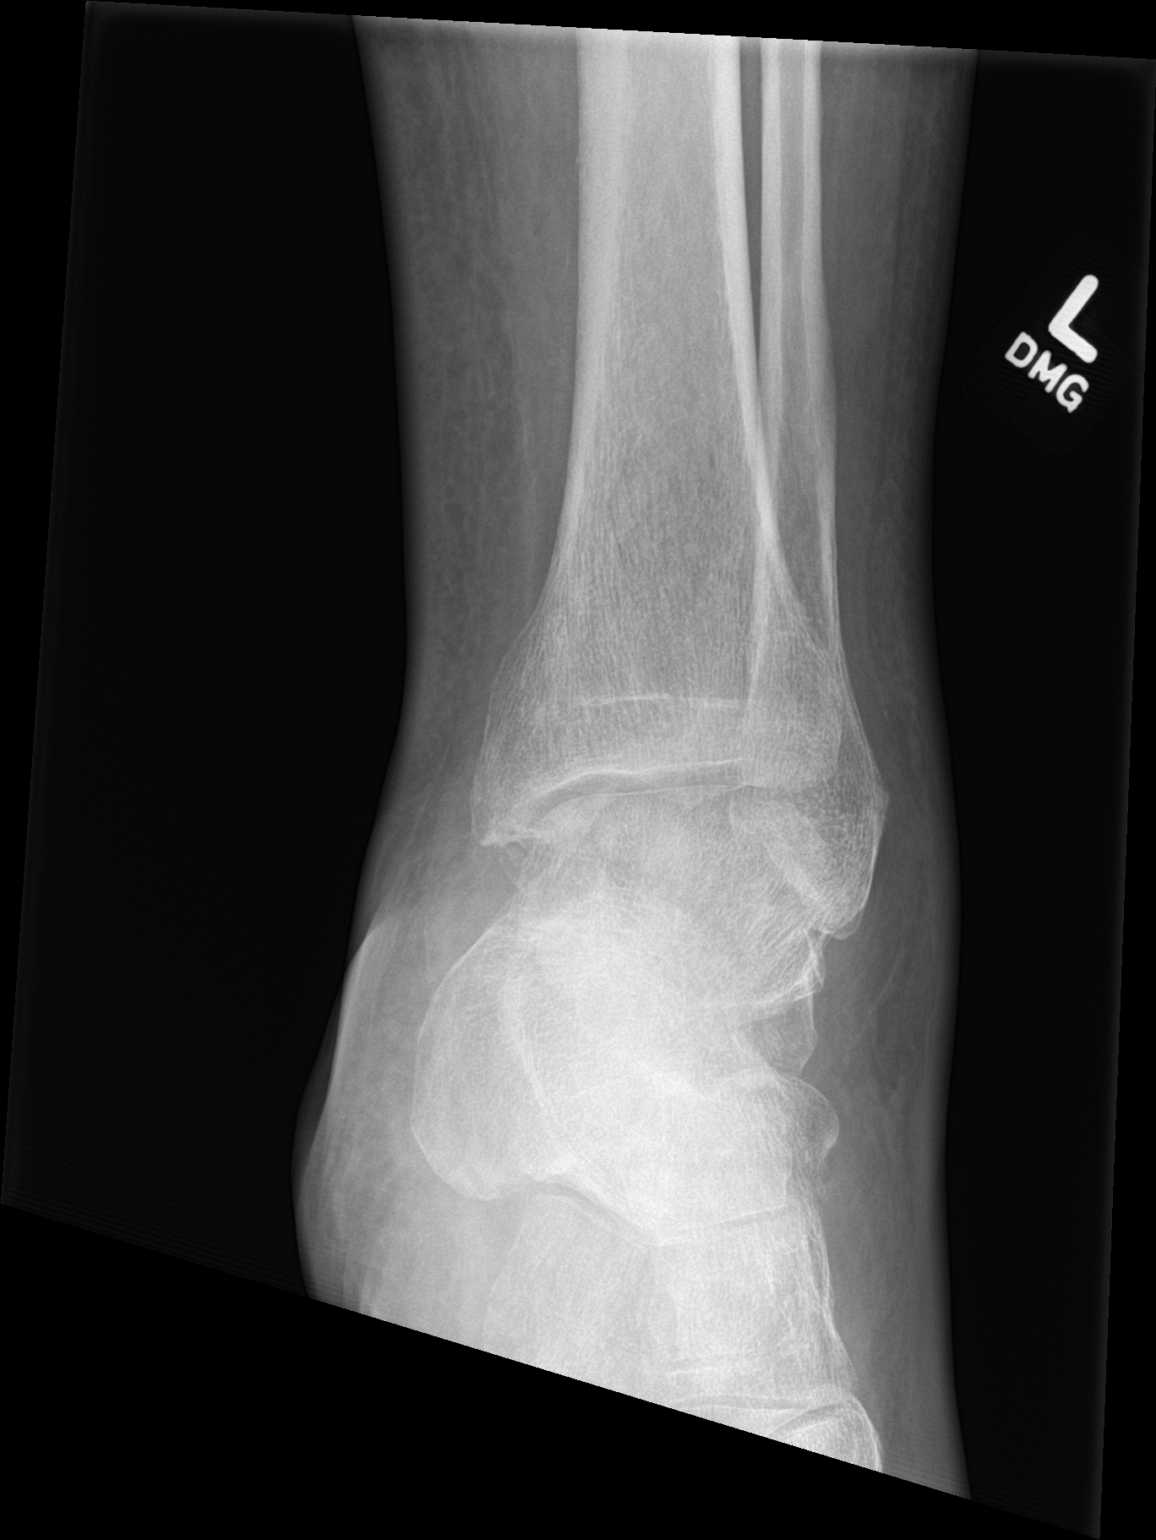

[ankle obl]
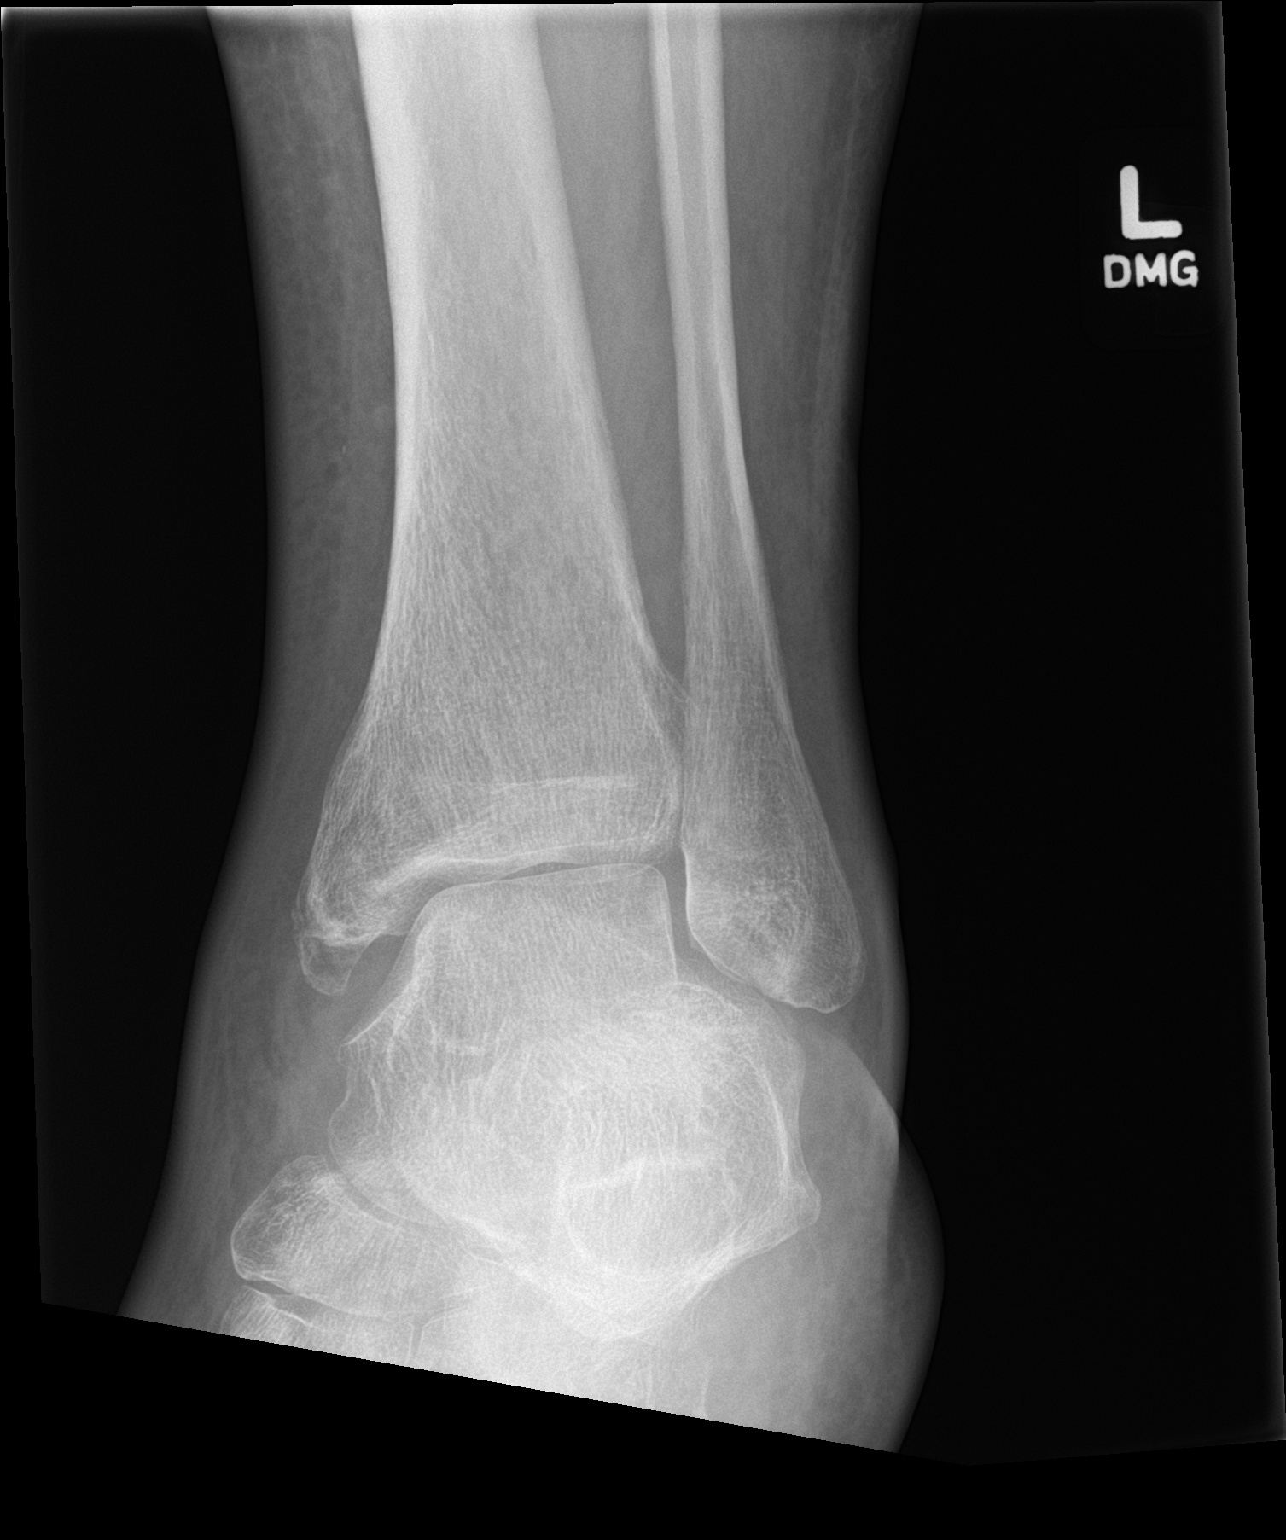

[ankle lat]
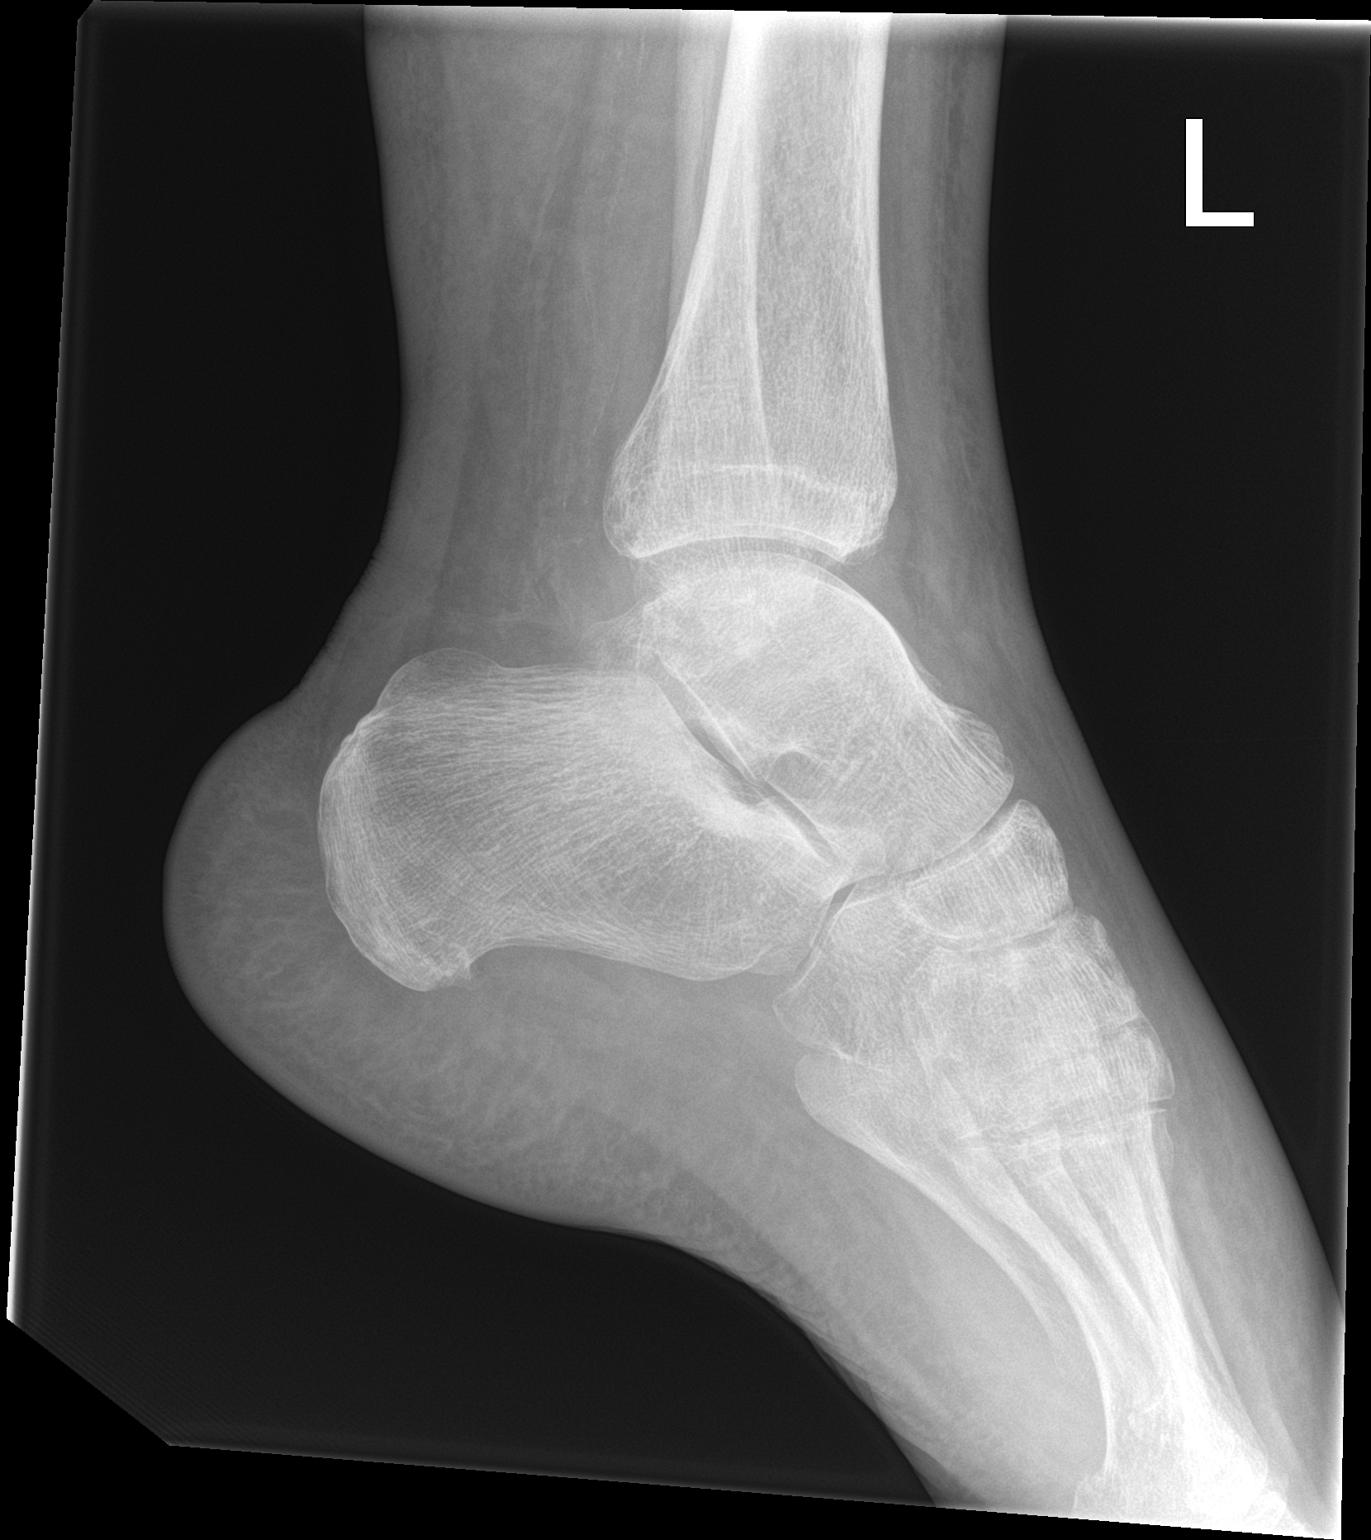

[3 of 3 positions shown; findings below may reference images not displayed]

FINDINGS: Acute, nondisplaced fracture is seen involving the left medial
malleolus. There is no evidence of dislocation. Mild diffuse soft
tissue swelling is noted.
IMPRESSION: Acute fracture of the left medial malleolus.

## 2021-05-11 IMAGING — DX DG FOOT COMPLETE 3+V*L*
3 series · 3 of 3 positions shown · non-contrast
Comparison: None.

CLINICAL DATA: Status post fall.

EXAM:
LEFT FOOT - COMPLETE 3+ VIEW

[foot ap]
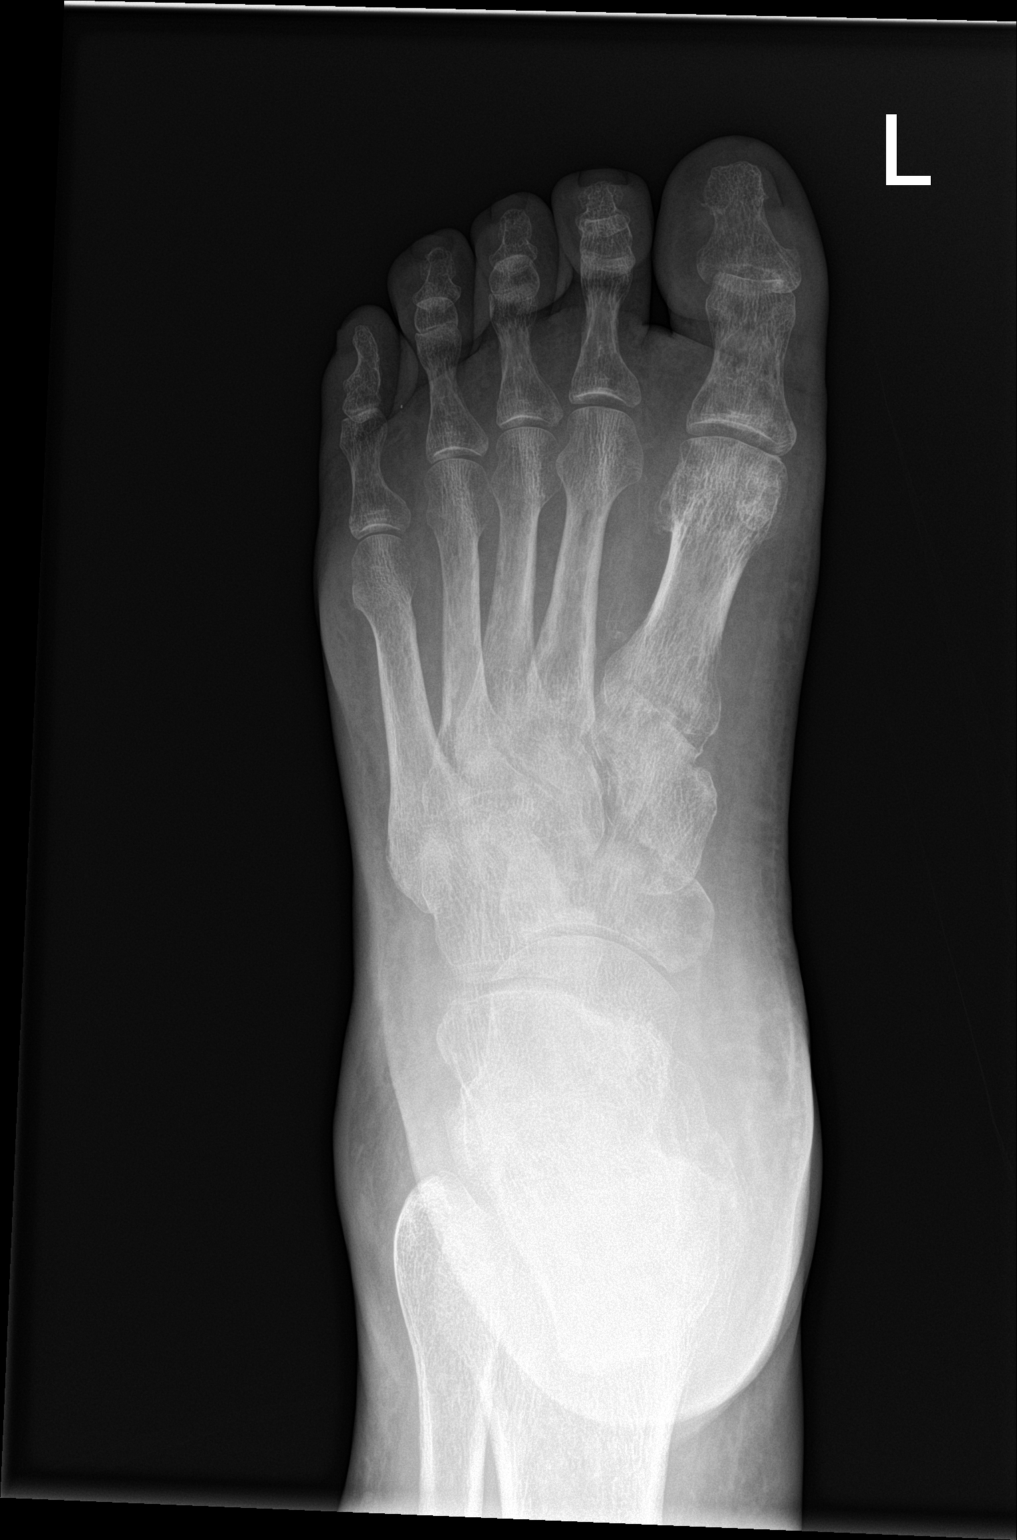

[foot obl]
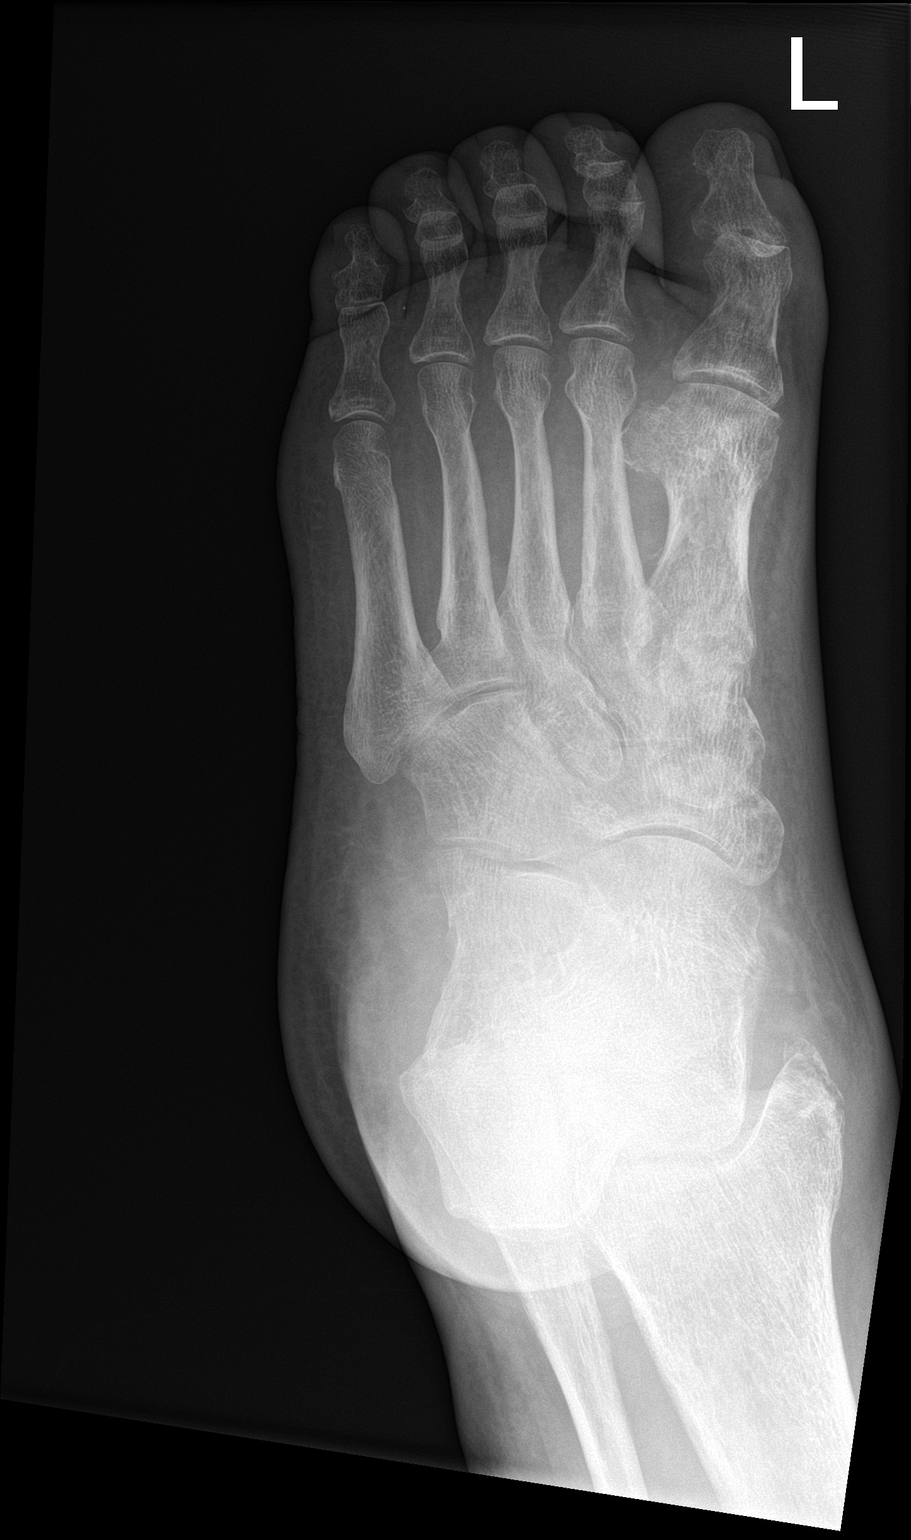

[foot lat]
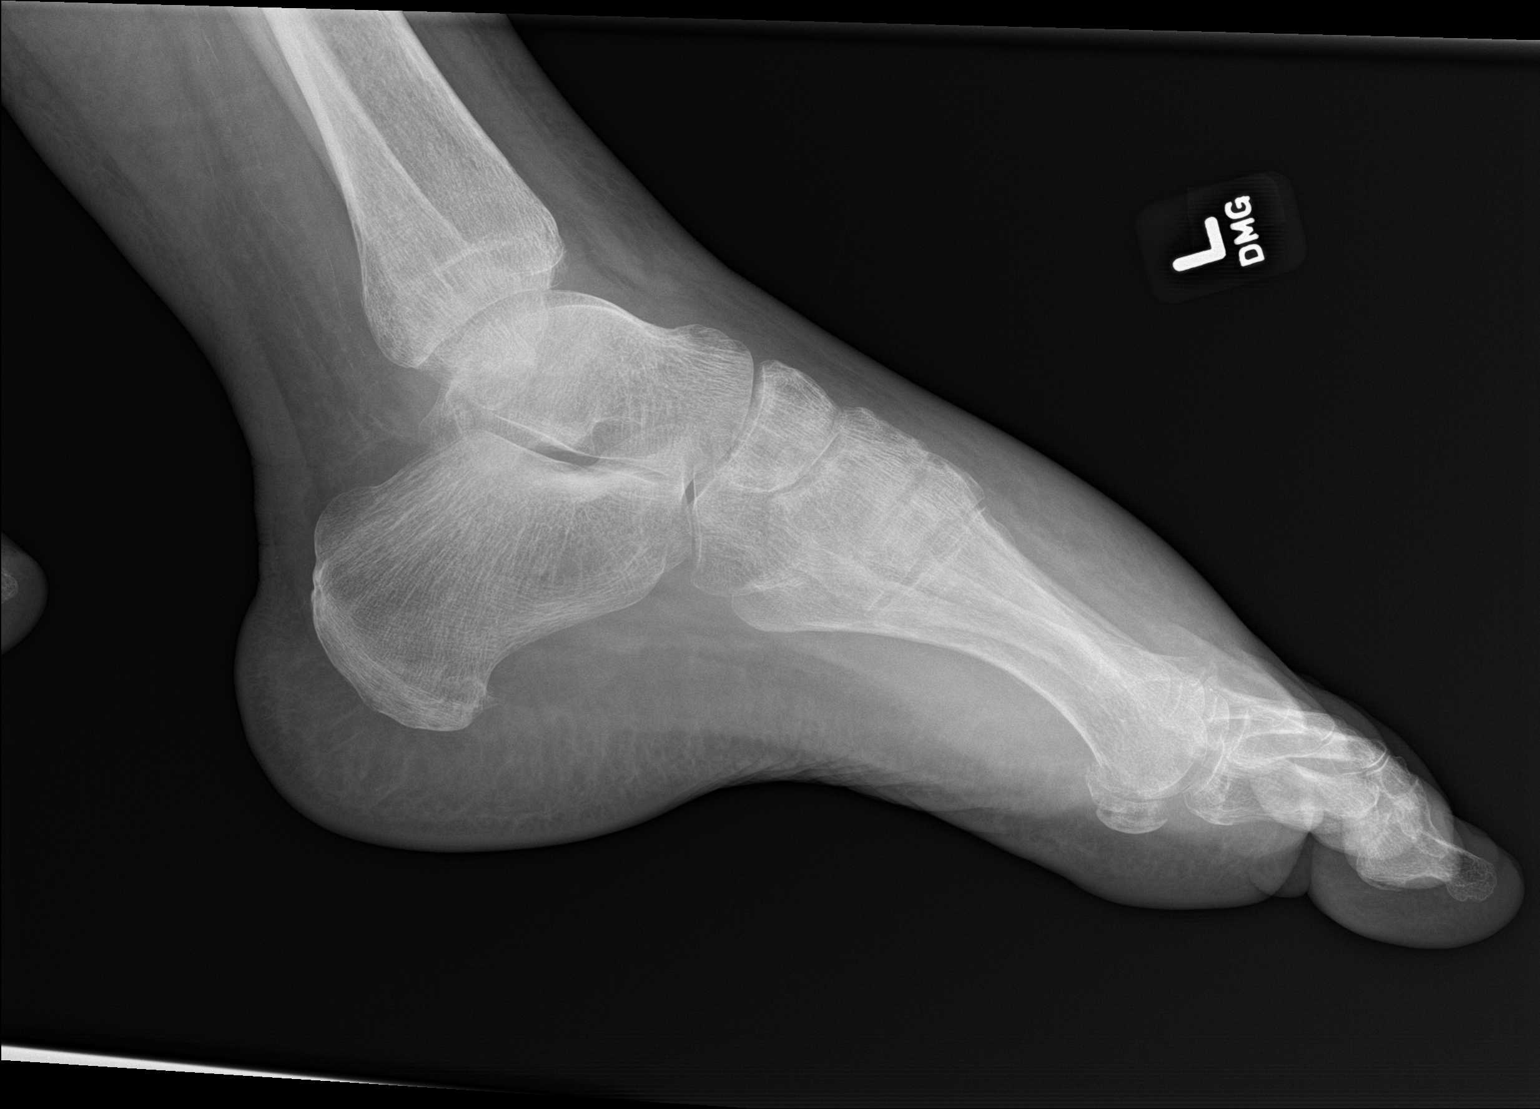

[3 of 3 positions shown; findings below may reference images not displayed]

FINDINGS: Acute fracture deformity is seen involving the left medial
malleolus. A thin ill-defined cortical lucency is seen involving the
medial aspect of the base of the proximal phalanx of the left great
toe. There is no evidence of dislocation. Mild degenerative changes
are noted along the mid left foot. Moderate severity diffuse soft
tissue swelling is seen. This is most prominent along the dorsal
aspect of the mid to distal left foot.
IMPRESSION: 1. Acute fracture of the left medial malleolus.
2. Additional findings which may represent a nondisplaced fracture
of the proximal phalanx of the left great toe. Correlation with
physical examination of this region is recommended to determine the
presence of point tenderness.

## 2021-05-11 IMAGING — DX DG KNEE COMPLETE 4+V*L*
4 series · 4 of 4 positions shown · non-contrast
Comparison: None.

CLINICAL DATA: Status post fall.

EXAM:
LEFT KNEE - COMPLETE 4+ VIEW

[knee ap]
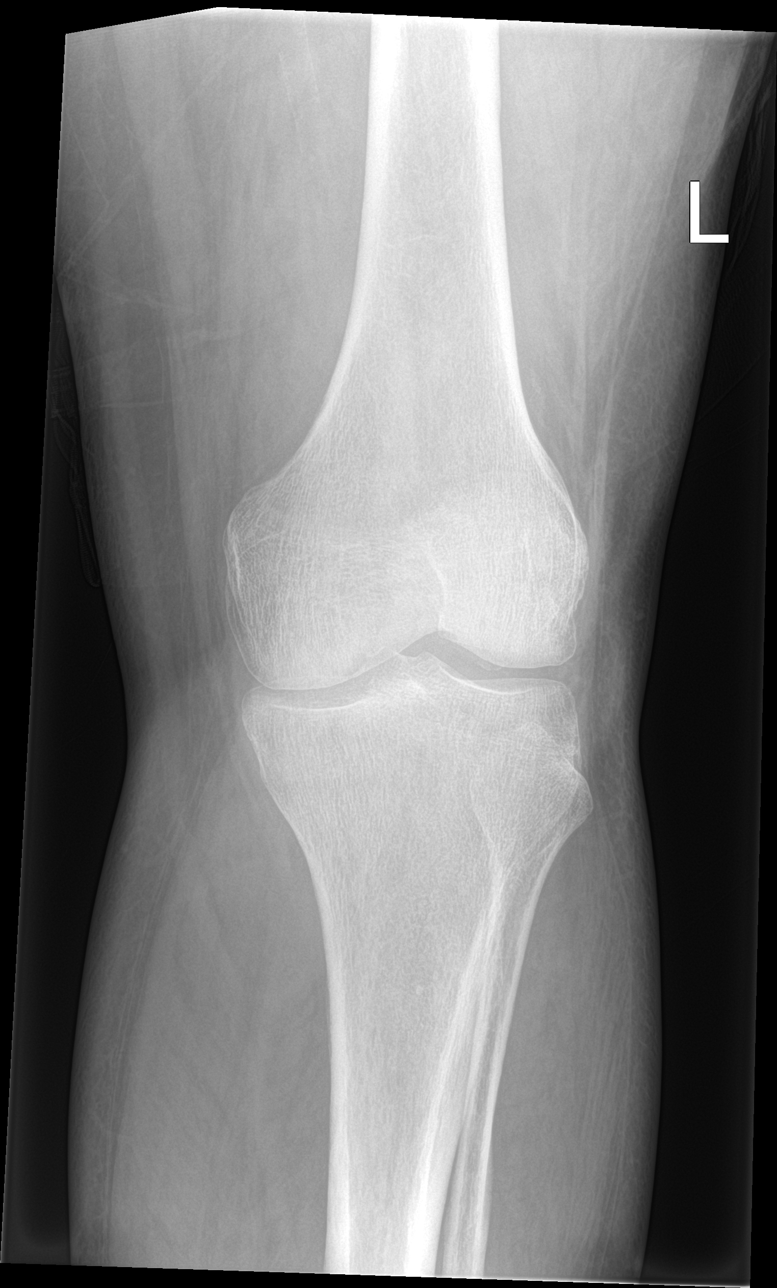

[knee lat]
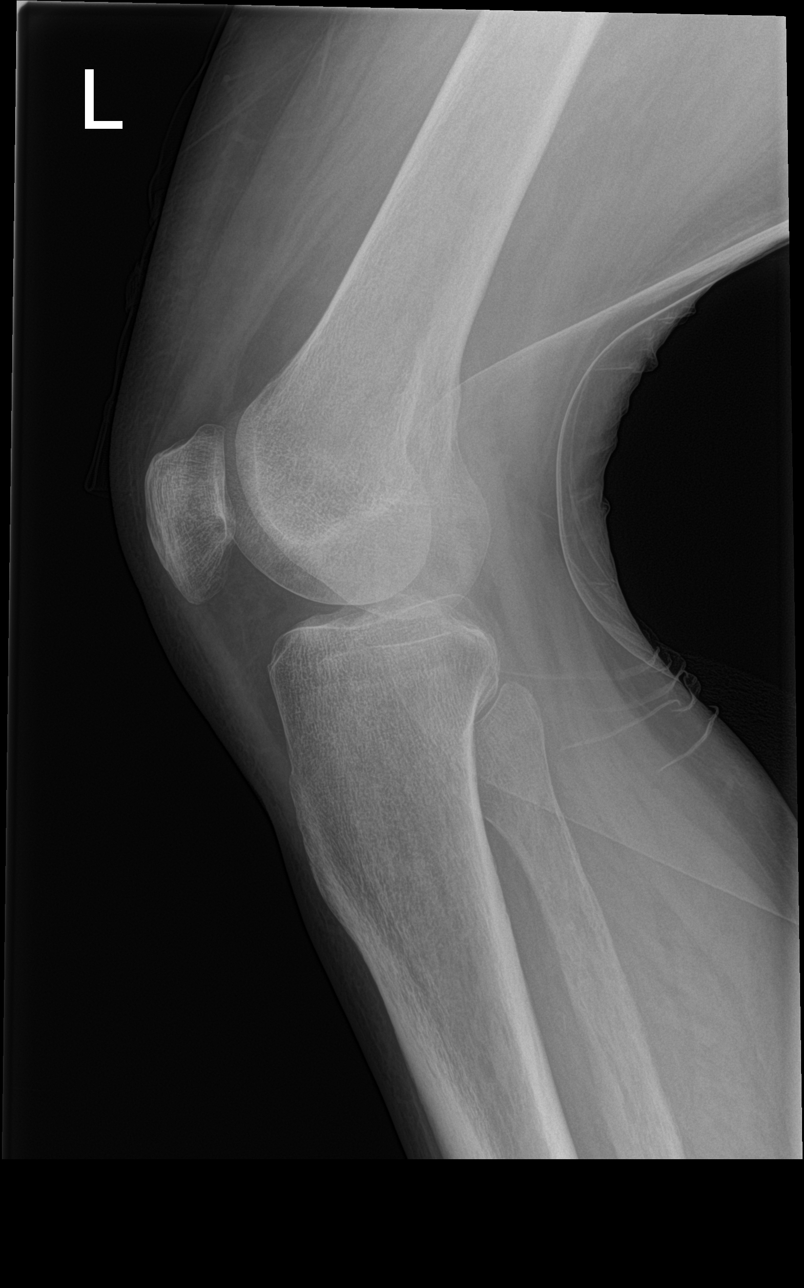

[knee obl (1 of 2)]
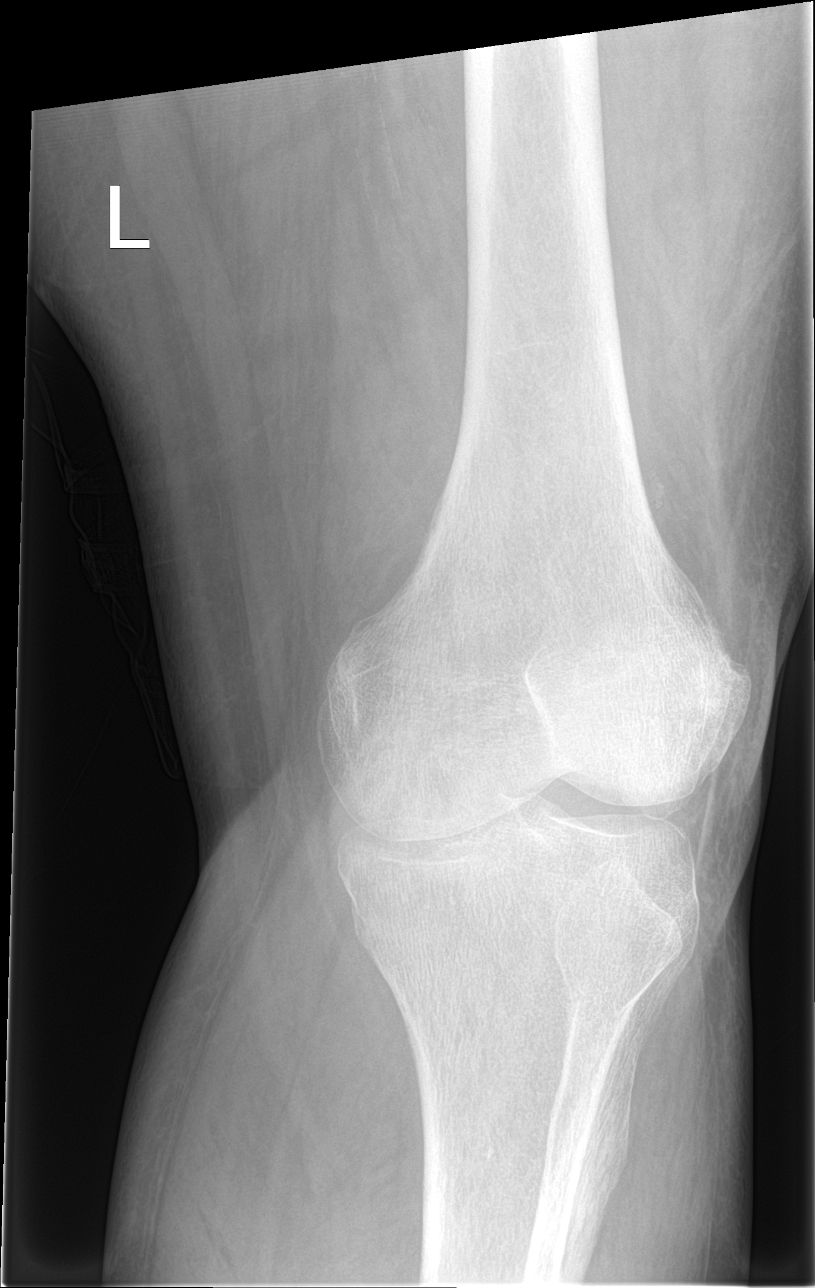

[knee obl (2 of 2)]
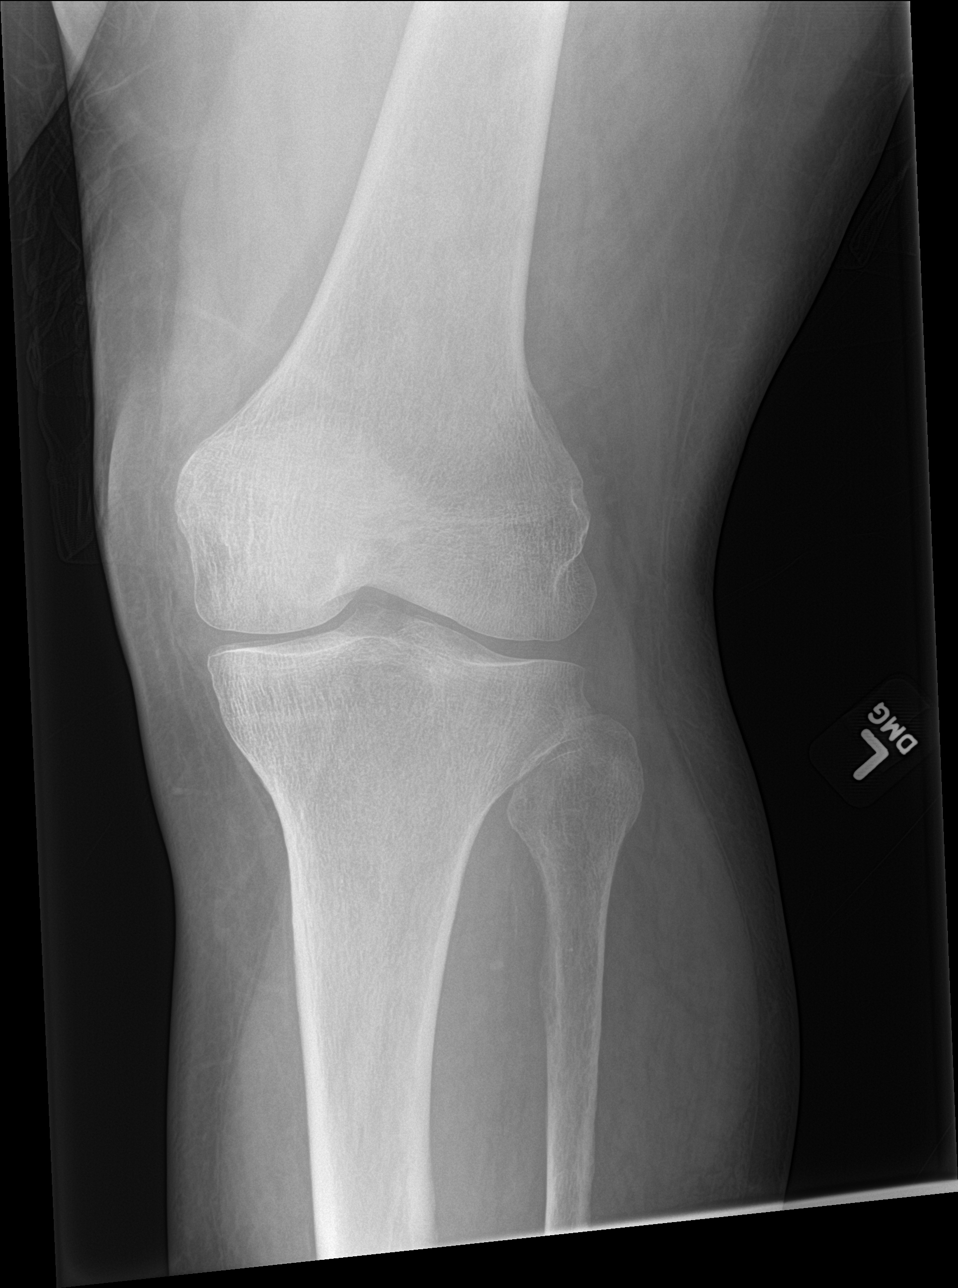

[4 of 4 positions shown; findings below may reference images not displayed]

FINDINGS: No evidence of fracture, dislocation, or joint effusion. No evidence
of arthropathy or other focal bone abnormality. Soft tissues are
unremarkable.
IMPRESSION: Negative.

## 2021-05-11 MED ORDER — OXYCODONE-ACETAMINOPHEN 5-325 MG PO TABS
1.0000 | ORAL_TABLET | Freq: Once | ORAL | Status: AC
  Administered 2021-05-11: 1 via ORAL
  Filled 2021-05-11: qty 1

## 2021-05-11 NOTE — ED Triage Notes (Signed)
Pt brought in from home by EMS after having a fall  Pt was using his walker and his foot got caught up under him and he fell  Pt is c/o left foot and ankle pain and bilateral knee pain  Swelling noted to his left foot with some redness  Pt is alert and oriented

## 2021-05-11 NOTE — ED Provider Notes (Signed)
[facility] EMERGENCY DEPARTMENT Provider Note   CSN: [id number] Arrival date & time: 05/11/21  2003     History Chief Complaint  Patient presents with   Tyler Brewer    Tyler Brewer is a 60 y.o. male.  Past medical history CIDP, diabetes, CAD, hypertension presenting to ER with concern for fall.  Patient states that his left foot got caught under him and twisted awkwardly causing him to fall forward landing on his knees.  Having pain in both knees, left ankle and left foot.  Pain is currently moderate, improved with rest and worse with motion.  Aching.  Did not hit his head.  Did not lose consciousness.  No associated lightheadedness, nausea.  HPI     Past Medical History:  Diagnosis Date   Arthritis    CAD (coronary artery disease)  cardiac cath with moderate disease in a septal branch of the ramus intermedius 04/01/2012   CIDP (chronic inflammatory demyelinating polyneuropathy) ([place])    Depression    Diabetes mellitus    poorly controlled by his report   History of narcotic addiction ([place])    past history of back pain   Hypercholesteremia    Hypertension    IBS (irritable bowel syndrome)    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[place])    Neuropathy    Obesity    Max weight was 390   OSA on CPAP    Panic attacks    Testosterone deficiency    Vertigo     Patient Active Problem List   Diagnosis Date Noted   CIDP (chronic inflammatory demyelinating polyneuropathy) ([place]) 01/12/2021   Diabetes mellitus ([place]) 01/03/2021   Type 2 diabetes mellitus with hyperglycemia, with long-term current use of insulin ([place]) 01/03/2021   Lower extremity edema 11/28/2020   Chronic low back pain 11/28/2020   Psoriatic arthritis ([place]) 11/28/2020   Thoracic aortic aneurysm without rupture ([place]) 11/28/2020   Bilateral leg weakness 11/05/2020   Acute left-sided low back pain with right-sided sciatica 07/19/2020   Hip pain, acute, left 07/05/2020   Tremor 07/05/2020   Weakness 07/05/2020    Balance problem 03/11/2020   Tinea cruris 03/11/2020   Cellulitis of left groin 03/11/2020   Acute pain of right shoulder 03/11/2020   Interstitial lung disease ([place]) 05/02/2017   Pansinusitis 09/26/2016   Diabetic polyneuropathy associated with diabetes mellitus due to underlying condition ([place]) 05/08/2016   Methamphetamine use disorder, severe, dependence ([place]) 02/11/2016   Substance induced mood disorder ([place]) 02/11/2016   Exertional dyspnea 11/30/2015   ADD (attention deficit disorder) 09/29/2015   Binge eating 09/29/2015   Syncope 05/05/2012   Diarrhea 05/05/2012   Panic attacks 05/05/2012   OSA (obstructive sleep apnea) 05/05/2012   HTN (hypertension) 05/05/2012   Dyslipidemia 05/05/2012   CAD (coronary artery disease)  cardiac cath with moderate disease in a septal branch of the ramus intermedius 04/01/2012   Chest pain 04/01/2012   Drug abuse and dependence ([place]) 04/01/2012   Family history of coronary artery disease 03/31/2012   Sleep apnea, on C-pap 03/31/2012   Hyperlipemia 03/30/2012   HTN (hypertension), benign 03/30/2012   Morbid obesity ([place]) 03/30/2012   DM type 2, uncontrolled, with neuropathy ([place]) 03/30/2012   Depression with suicidal ideation 03/30/2012    Past Surgical History:  Procedure Laterality Date   CARDIAC CATHETERIZATION     IR FLUORO GUIDE CV LINE RIGHT  11/10/2020   IR US GUIDE VASC ACCESS RIGHT  11/10/2020   LEFT HEART CATHETERIZATION WITH CORONARY ANGIOGRAM N/A 03/31/2012  Procedure: LEFT HEART CATHETERIZATION WITH CORONARY ANGIOGRAM;  Surgeon: Leonie Man, MD;  Location: [facility] CATH LAB;  Service: Cardiovascular;  Laterality: N/A;       Family History  Problem Relation Age of Onset   Diabetes type II Father    Hypertension Father    Pancreatic disease Father 64       Deceased   Healthy Mother    Healthy Sister    Healthy Son    Healthy Daughter    Parkinson's disease Maternal Grandmother     Social History   Tobacco Use    Smoking status: Former    Packs/day: 0.50    Types: Cigarettes    Quit date: 04/27/2021    Years since quitting: 0.0   Smokeless tobacco: Never  Vaping Use   Vaping Use: Never used  Substance Use Topics   Alcohol use: No   Drug use: Not Currently    Types: Methamphetamines    Home Medications Prior to Admission medications   Medication Sig Start Date End Date Taking? Authorizing Provider  acetaminophen (TYLENOL) 500 MG tablet Take 500-1,000 mg by mouth every 6 (six) hours as needed for mild pain.    [provider]  amLODipine (NORVASC) 10 MG tablet Take 1 tablet (10 mg total) by mouth daily. Patient not taking: Reported on 04/25/2021 01/26/21   Carollee Herter, Alferd Apa, DO  COMFORT EZ INSULIN SYRINGE 31G X 5/16" 1 ML MISC  12/15/20   [provider]  Cyanocobalamin (VITAMIN B-12 PO) Take 1 tablet by mouth daily.    [provider]  diclofenac Sodium (VOLTAREN) 1 % GEL Apply 4 g topically 4 (four) times daily as needed (joint pain).    [provider]  furosemide (LASIX) 20 MG tablet TAKE 1 TABLET(20 MG) BY MOUTH DAILY Patient taking differently: Take 40 mg by mouth daily. 12/21/20   [name], DO  gabapentin (NEURONTIN) 300 MG capsule Take 300 mg by mouth 2 (two) times daily. 02/16/21   [provider]  hydrALAZINE (APRESOLINE) 50 MG tablet Take 1 tablet (50 mg total) by mouth every 8 (eight) hours. 04/20/21   [name], DO  ibuprofen (ADVIL) 200 MG tablet Take 400 mg by mouth every 6 (six) hours as needed for mild pain.    [provider]  insulin NPH Human (NOVOLIN N) 100 UNIT/ML injection Inject 35 Units into the skin at bedtime.    [provider]  Insulin Pen Needle 31G X 5 MM MISC 1 Device by Does not apply route in the morning, at noon, in the evening, and at bedtime. 01/03/21   [name], Melanie Crazier, MD  Insulin Regular Human (HUMULIN R) 100 UNIT/ML KwikPen Inject 14 Units into the skin 3 (three) times  daily before meals. Uses [employer] Relion R insulin    [provider]  losartan (COZAAR) 100 MG tablet TAKE ONE TABLET BY MOUTH DAILY Patient taking differently: Take 100 mg by mouth daily. 04/18/21   [name] R, DO  MAGNESIUM PO Take 1 tablet by mouth daily.    [provider]  meclizine (ANTIVERT) 25 MG tablet Take 1 tablet (25 mg total) by mouth 3 (three) times daily as needed for dizziness. 02/18/21   Molpus, John, MD  meloxicam (MOBIC) 15 MG tablet Take 1 tablet (15 mg total) by mouth daily. 03/14/21   Kirsteins, Luanna Salk, MD  Menthol, Topical Analgesic, (ICY HOT EX) Apply 1 application topically as needed (pain).  [provider]  metFORMIN (GLUCOPHAGE-XR) 500 MG 24 hr tablet Take 2 tablets (1,000 mg total) by mouth in the morning and at bedtime. 01/26/21   [name], DO  methocarbamol (ROBAXIN) 500 MG tablet Take 500 mg by mouth 4 (four) times daily as needed for muscle spasms.    [provider]  Multiple Vitamin (MULTIVITAMIN) tablet Take 1 tablet by mouth daily.    [provider]  pravastatin (PRAVACHOL) 40 MG tablet Take 1 tablet (40 mg total) by mouth daily. 01/26/21   [name], DO  pregabalin (LYRICA) 75 MG capsule Take 1 capsule (75 mg total) by mouth 2 (two) times daily. 03/14/21   Kirsteins, Luanna Salk, MD  tamsulosin (FLOMAX) 0.4 MG CAPS capsule TAKE ONE CAPSULE BY MOUTH ONE TIME DAILY Patient taking differently: Take 0.4 mg by mouth daily. 04/18/21   [name], DO  traMADol (ULTRAM) 50 MG tablet 2 po q6 h prn Patient taking differently: Take 100 mg by mouth every 6 (six) hours as needed for moderate pain. 02/03/21   [name], DO    Allergies    Patient has no known allergies.  Review of Systems   Review of Systems  Constitutional:  Negative for chills and fever.  HENT:  Negative for ear pain and sore throat.   Eyes:  Negative for pain and visual disturbance.  Respiratory:   Negative for cough and shortness of breath.   Cardiovascular:  Negative for chest pain and palpitations.  Gastrointestinal:  Negative for abdominal pain and vomiting.  Genitourinary:  Negative for dysuria and hematuria.  Musculoskeletal:  Positive for arthralgias. Negative for back pain.  Skin:  Negative for color change and rash.  Neurological:  Negative for seizures and syncope.  All other systems reviewed and are negative.  Physical Exam Updated Vital Signs BP (!) 153/89 (BP Location: Right Arm)   Pulse 84   Temp 98.3 F (36.8 C) (Oral)   Resp 17   Ht 5\' 11"  (1.803 m)   Wt 131.5 kg   SpO2 95%   BMI 40.45 kg/m   Physical Exam Vitals and nursing note reviewed.  Constitutional:      Appearance: He is well-developed.  HENT:     Head: Normocephalic and atraumatic.  Eyes:     Conjunctiva/sclera: Conjunctivae normal.  Cardiovascular:     Rate and Rhythm: Normal rate and regular rhythm.     Heart sounds: No murmur heard. Pulmonary:     Effort: Pulmonary effort is normal. No respiratory distress.     Breath sounds: Normal breath sounds.  Abdominal:     Palpations: Abdomen is soft.     Tenderness: There is no abdominal tenderness.  Musculoskeletal:     Cervical back: Neck supple.     Comments: Back: no C, T, L spine TTP, no step off or deformity RUE: no TTP throughout, no deformity, normal joint ROM, radial pulse intact, distal sensation and motor intact LUE: no TTP throughout, no deformity, normal joint ROM, radial pulse intact, distal sensation and motor intact RLE:  no TTP throughout, no deformity, normal joint ROM, distal pulse, sensation and motor intact LLE: some TTP over knee, ankle, foot, mild swelling to ankle, foot, distal pulse, sensation and motor intact  Skin:    General: Skin is warm and dry.  Neurological:     Mental Status: He is alert.    ED Results / Procedures / Treatments   Labs (all labs ordered  are listed, but only abnormal results are  displayed) Labs Reviewed - No data to display  EKG None  Radiology DG Ankle Complete Left  Result Date: 05/11/2021 CLINICAL DATA:  Status post fall. EXAM: LEFT ANKLE COMPLETE - 3+ VIEW COMPARISON:  None. FINDINGS: Acute, nondisplaced fracture is seen involving the left medial malleolus. There is no evidence of dislocation. Mild diffuse soft tissue swelling is noted. IMPRESSION: Acute fracture of the left medial malleolus. Electronically Signed   By: Virgina Norfolk M.D.   On: 05/11/2021 22:02   DG Knee Complete 4 Views Left  Result Date: 05/11/2021 CLINICAL DATA:  Status post fall. EXAM: LEFT KNEE - COMPLETE 4+ VIEW COMPARISON:  None. FINDINGS: No evidence of fracture, dislocation, or joint effusion. No evidence of arthropathy or other focal bone abnormality. Soft tissues are unremarkable. IMPRESSION: Negative. Electronically Signed   By: Virgina Norfolk M.D.   On: 05/11/2021 21:56   DG Foot Complete Left  Result Date: 05/11/2021 CLINICAL DATA:  Status post fall. EXAM: LEFT FOOT - COMPLETE 3+ VIEW COMPARISON:  None. FINDINGS: Acute fracture deformity is seen involving the left medial malleolus. A thin ill-defined cortical lucency is seen involving the medial aspect of the base of the proximal phalanx of the left great toe. There is no evidence of dislocation. Mild degenerative changes are noted along the mid left foot. Moderate severity diffuse soft tissue swelling is seen. This is most prominent along the dorsal aspect of the mid to distal left foot. IMPRESSION: 1. Acute fracture of the left medial malleolus. 2. Additional findings which may represent a nondisplaced fracture of the proximal phalanx of the left great toe. Correlation with physical examination of this region is recommended to determine the presence of point tenderness. Electronically Signed   By: Virgina Norfolk M.D.   On: 05/11/2021 22:01    Procedures Procedures   Medications Ordered in ED Medications   oxyCODONE-acetaminophen (PERCOCET/ROXICET) 5-325 MG per tablet 1 tablet (1 tablet Oral Given 05/11/21 2133)    ED Course  I have reviewed the triage vital signs and the nursing notes.  Pertinent labs & imaging results that were available during my care of the patient were reviewed by me and considered in my medical decision making (see chart for details).  Clinical Course as of 05/12/21 1245  Thu May 11, 2021  2231 [phone] [RD]    Clinical Course User Index [RD] Lucrezia Starch, MD   MDM Rules/Calculators/A&P                           60 year old male presenting to the ER with concern for fall.  Mechanical, no LOC.  Denies head trauma and no trauma identified on exam to head or neck or torso.  Extremity exam noted to have some tenderness to the left knee, ankle, foot.  Plain films concerning for nondisplaced fracture of the left medial malleolus as well as possible fracture of the left great toe.  Neurovascularly intact.  Reviewed with Dr. Doreatha Martin on-call for Ortho.  Discussed patient's chronic physical limitations from his CIDP and use of walker at baseline.  Dr. Doreatha Martin said okay for walking boot, weightbearing as tolerated and outpatient follow-up.  Discussed findings with patient, review of PMP shows on significant chronic narcotics which should be adequate for his pain control of these fractures.  Instructed on follow-up with Ortho, use of boot, discharged.   After the discussed management above, the patient was determined to be safe  for discharge.  The patient was in agreement with this plan and all questions regarding their care were answered.  ED return precautions were discussed and the patient will return to the ED with any significant worsening of condition.  Final Clinical Impression(s) / ED Diagnoses Final diagnoses:  Closed nondisplaced fracture of medial malleolus of left tibia, initial encounter  Closed nondisplaced fracture of proximal phalanx of left great toe,  initial encounter    Rx / DC Orders ED Discharge Orders     None        Lucrezia Starch, MD 05/12/21 1246

## 2021-05-11 NOTE — Progress Notes (Incomplete)
Subjective:   By signing my name below, I, Tyler Brewer, attest that this documentation has been prepared under the direction and in the presence of [name], DO  05/11/2021.    Patient ID: Tyler Brewer, male    DOB: [birth year], 60 y.o.   MRN: [id number]  No chief complaint on file.   HPI Patient is in today for a hospital follow up.   He was admitted to the hospital on 04/26/2021 for worsening falls and weakness at home. He was discharged on 05/05/2021 and was instructed to follow up with PCP. He was also instructed to follow up with neurologist.   Past Medical History:  Diagnosis Date   Arthritis    CAD (coronary artery disease)  cardiac cath with moderate disease in a septal branch of the ramus intermedius 04/01/2012   Depression    Diabetes mellitus    poorly controlled by his report   History of narcotic addiction ([place])    past history of back pain   Hypercholesteremia    Hypertension    IBS (irritable bowel syndrome)    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[place])    Neuropathy    Obesity    Max weight was 390   OSA on CPAP    Panic attacks    Testosterone deficiency    Vertigo     Past Surgical History:  Procedure Laterality Date   CARDIAC CATHETERIZATION     IR FLUORO GUIDE CV LINE RIGHT  11/10/2020   IR US GUIDE VASC ACCESS RIGHT  11/10/2020   LEFT HEART CATHETERIZATION WITH CORONARY ANGIOGRAM N/A 03/31/2012   Procedure: LEFT HEART CATHETERIZATION WITH CORONARY ANGIOGRAM;  Surgeon: Leonie Man, MD;  Location: [facility] CATH LAB;  Service: Cardiovascular;  Laterality: N/A;    Family History  Problem Relation Age of Onset   Diabetes type II Father    Hypertension Father    Pancreatic disease Father 45       Deceased   Healthy Mother    Healthy Sister    Healthy Son    Healthy Daughter    Parkinson's disease Maternal Grandmother     Social History   Socioeconomic History   Marital status: Single    Spouse name: Not on file   Number of children: 2    Years of education: Not on file   Highest education level: Not on file  Occupational History   Not on file  Tobacco Use   Smoking status: Every Day    Packs/day: 0.50    Types: Cigarettes    Last attempt to quit: 09/29/2020    Years since quitting: 0.6   Smokeless tobacco: Never  Vaping Use   Vaping Use: Never used  Substance and Sexual Activity   Alcohol use: No   Drug use: Not Currently    Types: Methamphetamines   Sexual activity: Yes  Other Topics Concern   Not on file  Social History Narrative     Has 2 grown children.     Works as a Barrister's clerk.  Education: Oceanographer.   Right Handed   Drinks Caffeine    Mother recently moved and sold her house-- pt is living with a friend in a trailer    Social Determinants of Health   Financial Resource Strain: Medium Risk   Difficulty of Paying Living Expenses: Somewhat hard  Food Insecurity: No Food Insecurity   Worried About Charity fundraiser in the Last Year: Never true   [employer]  in the Last Year: Never true  Transportation Needs: Public librarian (Medical): Yes   Lack of Transportation (Non-Medical): Yes  Physical Activity: Not on file  Stress: Not on file  Social Connections: Not on file  Intimate Partner Violence: Not on file    Outpatient Medications Prior to Visit  Medication Sig Dispense Refill   acetaminophen (TYLENOL) 500 MG tablet Take 500-1,000 mg by mouth every 6 (six) hours as needed for mild pain.     amLODipine (NORVASC) 10 MG tablet Take 1 tablet (10 mg total) by mouth daily. (Patient not taking: Reported on 04/25/2021) 90 tablet 1   COMFORT EZ INSULIN SYRINGE 31G X 5/16" 1 ML MISC      Cyanocobalamin (VITAMIN B-12 PO) Take 1 tablet by mouth daily.     diclofenac Sodium (VOLTAREN) 1 % GEL Apply 4 g topically 4 (four) times daily as needed (joint pain).     furosemide (LASIX) 20 MG tablet TAKE 1 TABLET(20 MG) BY MOUTH DAILY (Patient taking differently: Take 40 mg by  mouth daily.) 90 tablet 0   gabapentin (NEURONTIN) 300 MG capsule Take 300 mg by mouth 2 (two) times daily.     hydrALAZINE (APRESOLINE) 50 MG tablet Take 1 tablet (50 mg total) by mouth every 8 (eight) hours. 270 tablet 0   ibuprofen (ADVIL) 200 MG tablet Take 400 mg by mouth every 6 (six) hours as needed for mild pain.     insulin NPH Human (NOVOLIN N) 100 UNIT/ML injection Inject 35 Units into the skin at bedtime.     Insulin Pen Needle 31G X 5 MM MISC 1 Device by Does not apply route in the morning, at noon, in the evening, and at bedtime. 400 each 2   Insulin Regular Human (HUMULIN R) 100 UNIT/ML KwikPen Inject 14 Units into the skin 3 (three) times daily before meals. Uses [employer] Relion R insulin     losartan (COZAAR) 100 MG tablet TAKE ONE TABLET BY MOUTH DAILY (Patient taking differently: Take 100 mg by mouth daily.) 90 tablet 1   MAGNESIUM PO Take 1 tablet by mouth daily.     meclizine (ANTIVERT) 25 MG tablet Take 1 tablet (25 mg total) by mouth 3 (three) times daily as needed for dizziness. 30 tablet 0   meloxicam (MOBIC) 15 MG tablet Take 1 tablet (15 mg total) by mouth daily. 30 tablet 1   Menthol, Topical Analgesic, (ICY HOT EX) Apply 1 application topically as needed (pain).     metFORMIN (GLUCOPHAGE-XR) 500 MG 24 hr tablet Take 2 tablets (1,000 mg total) by mouth in the morning and at bedtime. 360 tablet 1   methocarbamol (ROBAXIN) 500 MG tablet Take 500 mg by mouth 4 (four) times daily as needed for muscle spasms.     Multiple Vitamin (MULTIVITAMIN) tablet Take 1 tablet by mouth daily.     pravastatin (PRAVACHOL) 40 MG tablet Take 1 tablet (40 mg total) by mouth daily. 90 tablet 1   pregabalin (LYRICA) 75 MG capsule Take 1 capsule (75 mg total) by mouth 2 (two) times daily. 60 capsule 1   tamsulosin (FLOMAX) 0.4 MG CAPS capsule TAKE ONE CAPSULE BY MOUTH ONE TIME DAILY (Patient taking differently: Take 0.4 mg by mouth daily.) 90 capsule 1   traMADol (ULTRAM) 50 MG tablet 2 po q6 h  prn (Patient taking differently: Take 100 mg by mouth every 6 (six) hours as needed for moderate pain.) 120 tablet 1   No facility-administered  medications prior to visit.    No Known Allergies  ROS     Objective:    Physical Exam Constitutional:      General: He is not in acute distress.    Appearance: Normal appearance. He is not ill-appearing.  HENT:     Right Ear: External ear normal.     Left Ear: External ear normal.  Eyes:     Extraocular Movements: Extraocular movements intact.     Pupils: Pupils are equal, round, and reactive to light.  Cardiovascular:     Heart sounds: Normal heart sounds. No murmur heard.   No gallop.  Pulmonary:     Effort: Pulmonary effort is normal. No respiratory distress.     Breath sounds: Normal breath sounds. No wheezing or rales.  Skin:    General: Skin is warm and dry.  Neurological:     Mental Status: He is alert and oriented to person, place, and time.  Psychiatric:        Behavior: Behavior normal.        Judgment: Judgment normal.    There were no vitals taken for this visit. Wt Readings from Last 3 Encounters:  04/12/21 280 lb (127 kg)  03/21/21 280 lb (127 kg)  03/16/21 280 lb (127 kg)    Diabetic Foot Exam - Simple   No data filed    Lab Results  Component Value Date   WBC 5.8 04/20/2021   HGB 12.1 (L) 04/20/2021   HCT 36.4 (L) 04/20/2021   PLT 300 04/20/2021   GLUCOSE 254 (H) 04/20/2021   CHOL 136 02/14/2021   TRIG 102.0 02/14/2021   HDL 55.50 02/14/2021   LDLDIRECT 124.0 05/02/2017   LDLCALC 60 02/14/2021   ALT 23 04/20/2021   AST 28 04/20/2021   NA 139 04/20/2021   K 4.4 04/20/2021   CL 106 04/20/2021   CREATININE 0.70 04/20/2021   BUN 17 04/20/2021   CO2 26 04/20/2021   TSH 2.57 02/14/2021   PSA 1.15 03/10/2020   INR 1.1 07/30/2013   HGBA1C 7.7 (H) 02/14/2021   MICROALBUR <0.7 05/02/2017    Lab Results  Component Value Date   TSH 2.57 02/14/2021   Lab Results  Component Value Date   WBC  5.8 04/20/2021   HGB 12.1 (L) 04/20/2021   HCT 36.4 (L) 04/20/2021   MCV 89.0 04/20/2021   PLT 300 04/20/2021   Lab Results  Component Value Date   NA 139 04/20/2021   K 4.4 04/20/2021   CO2 26 04/20/2021   GLUCOSE 254 (H) 04/20/2021   BUN 17 04/20/2021   CREATININE 0.70 04/20/2021   BILITOT 1.1 04/20/2021   ALKPHOS 78 04/20/2021   AST 28 04/20/2021   ALT 23 04/20/2021   PROT 6.6 04/20/2021   ALBUMIN 3.1 (L) 04/20/2021   CALCIUM 8.8 (L) 04/20/2021   ANIONGAP 7 04/20/2021   GFR 94.64 02/14/2021   Lab Results  Component Value Date   CHOL 136 02/14/2021   Lab Results  Component Value Date   HDL 55.50 02/14/2021   Lab Results  Component Value Date   LDLCALC 60 02/14/2021   Lab Results  Component Value Date   TRIG 102.0 02/14/2021   Lab Results  Component Value Date   CHOLHDL 2 02/14/2021   Lab Results  Component Value Date   HGBA1C 7.7 (H) 02/14/2021       Assessment & Plan:   Problem List Items Addressed This Visit   None  No orders of the defined types were placed in this encounter.   I, Tyler Brewer, personally preformed the services described in this documentation.  All medical record entries made by the scribe were at my direction and in my presence.  I have reviewed the chart and discharge instructions (if applicable) and agree that the record reflects my personal performance and is accurate and complete. 05/11/2021.   I,Savera Zaman,acting as a Education administrator for [employer], DO.,have documented all relevant documentation on the behalf of [name], DO,as directed by  [name], DO while in the presence of [name], DO.     Tyler Brewer

## 2021-05-11 NOTE — Discharge Instructions (Addendum)
Follow-up with orthopedic surgery.  Wear boot.  Use walker.  Come back to ER as needed for further falls or other new concerning symptom.  Take your previously prescribed tramadol as needed for pain.

## 2021-05-12 ENCOUNTER — Encounter: Payer: Self-pay | Admitting: Family Medicine

## 2021-05-12 ENCOUNTER — Encounter (HOSPITAL_BASED_OUTPATIENT_CLINIC_OR_DEPARTMENT_OTHER): Payer: Self-pay

## 2021-05-12 ENCOUNTER — Emergency Department (HOSPITAL_BASED_OUTPATIENT_CLINIC_OR_DEPARTMENT_OTHER)
Admission: EM | Admit: 2021-05-12 | Discharge: 2021-05-13 | Disposition: A | Discharge status: Home / Self Care | Payer: Medicare HMO | Source: Home / Self Care | Attending: Emergency Medicine | Admitting: Emergency Medicine

## 2021-05-12 ENCOUNTER — Other Ambulatory Visit: Payer: Self-pay

## 2021-05-12 ENCOUNTER — Emergency Department (HOSPITAL_BASED_OUTPATIENT_CLINIC_OR_DEPARTMENT_OTHER): Payer: Medicare HMO

## 2021-05-12 DIAGNOSIS — S92415A Nondisplaced fracture of proximal phalanx of left great toe, initial encounter for closed fracture: Secondary | ICD-10-CM

## 2021-05-12 DIAGNOSIS — Y9289 Other specified places as the place of occurrence of the external cause: Secondary | ICD-10-CM | POA: Insufficient documentation

## 2021-05-12 DIAGNOSIS — Z794 Long term (current) use of insulin: Secondary | ICD-10-CM | POA: Insufficient documentation

## 2021-05-12 DIAGNOSIS — M79651 Pain in right thigh: Secondary | ICD-10-CM | POA: Insufficient documentation

## 2021-05-12 DIAGNOSIS — W1839XA Other fall on same level, initial encounter: Secondary | ICD-10-CM | POA: Insufficient documentation

## 2021-05-12 DIAGNOSIS — I2510 Atherosclerotic heart disease of native coronary artery without angina pectoris: Secondary | ICD-10-CM | POA: Insufficient documentation

## 2021-05-12 DIAGNOSIS — S7010XA Contusion of unspecified thigh, initial encounter: Secondary | ICD-10-CM

## 2021-05-12 DIAGNOSIS — M549 Dorsalgia, unspecified: Secondary | ICD-10-CM | POA: Insufficient documentation

## 2021-05-12 DIAGNOSIS — S8991XA Unspecified injury of right lower leg, initial encounter: Secondary | ICD-10-CM | POA: Diagnosis not present

## 2021-05-12 DIAGNOSIS — S7000XA Contusion of unspecified hip, initial encounter: Secondary | ICD-10-CM

## 2021-05-12 DIAGNOSIS — M25551 Pain in right hip: Secondary | ICD-10-CM | POA: Diagnosis not present

## 2021-05-12 DIAGNOSIS — I878 Other specified disorders of veins: Secondary | ICD-10-CM | POA: Diagnosis not present

## 2021-05-12 DIAGNOSIS — I10 Essential (primary) hypertension: Secondary | ICD-10-CM | POA: Insufficient documentation

## 2021-05-12 DIAGNOSIS — E1140 Type 2 diabetes mellitus with diabetic neuropathy, unspecified: Secondary | ICD-10-CM | POA: Insufficient documentation

## 2021-05-12 DIAGNOSIS — S39012A Strain of muscle, fascia and tendon of lower back, initial encounter: Secondary | ICD-10-CM

## 2021-05-12 DIAGNOSIS — S79911A Unspecified injury of right hip, initial encounter: Secondary | ICD-10-CM | POA: Diagnosis not present

## 2021-05-12 DIAGNOSIS — Z7984 Long term (current) use of oral hypoglycemic drugs: Secondary | ICD-10-CM | POA: Insufficient documentation

## 2021-05-12 DIAGNOSIS — S8255XA Nondisplaced fracture of medial malleolus of left tibia, initial encounter for closed fracture: Secondary | ICD-10-CM

## 2021-05-12 DIAGNOSIS — M25561 Pain in right knee: Secondary | ICD-10-CM | POA: Insufficient documentation

## 2021-05-12 DIAGNOSIS — Z043 Encounter for examination and observation following other accident: Secondary | ICD-10-CM | POA: Diagnosis not present

## 2021-05-12 DIAGNOSIS — S3992XA Unspecified injury of lower back, initial encounter: Secondary | ICD-10-CM | POA: Diagnosis not present

## 2021-05-12 DIAGNOSIS — R5381 Other malaise: Secondary | ICD-10-CM | POA: Diagnosis not present

## 2021-05-12 DIAGNOSIS — S80919A Unspecified superficial injury of unspecified knee, initial encounter: Secondary | ICD-10-CM | POA: Diagnosis not present

## 2021-05-12 DIAGNOSIS — Z79899 Other long term (current) drug therapy: Secondary | ICD-10-CM | POA: Insufficient documentation

## 2021-05-12 DIAGNOSIS — W19XXXA Unspecified fall, initial encounter: Secondary | ICD-10-CM

## 2021-05-12 DIAGNOSIS — Z87891 Personal history of nicotine dependence: Secondary | ICD-10-CM | POA: Insufficient documentation

## 2021-05-12 DIAGNOSIS — Z743 Need for continuous supervision: Secondary | ICD-10-CM | POA: Diagnosis not present

## 2021-05-12 DIAGNOSIS — R279 Unspecified lack of coordination: Secondary | ICD-10-CM | POA: Diagnosis not present

## 2021-05-12 DIAGNOSIS — M545 Low back pain, unspecified: Secondary | ICD-10-CM | POA: Diagnosis not present

## 2021-05-12 IMAGING — DX DG HIP (WITH OR WITHOUT PELVIS) 2-3V*R*
3 series · 3 of 3 positions shown · non-contrast
Comparison: Radiograph [DATE]

CLINICAL DATA: Fall injuring lower back, right hip and right knee
today.

EXAM:
DG HIP (WITH OR WITHOUT PELVIS) 2-3V RIGHT

[pelvis ap]
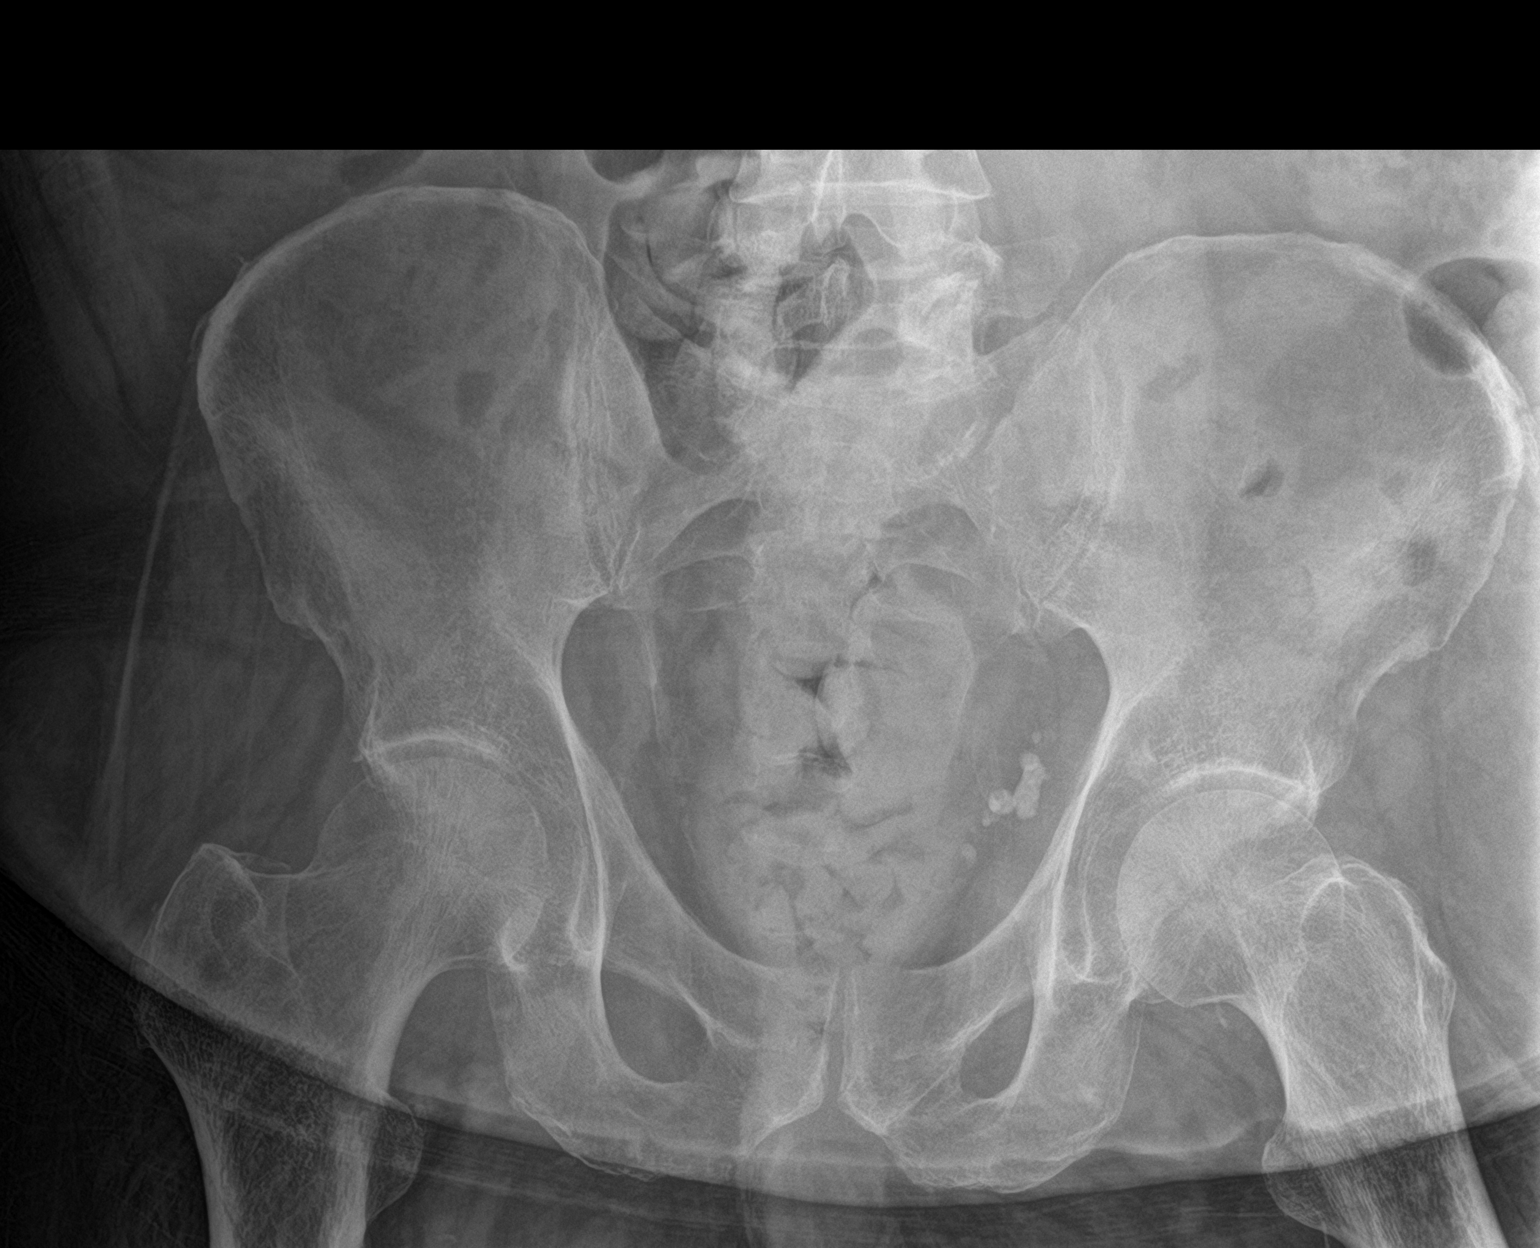

[hip ap]
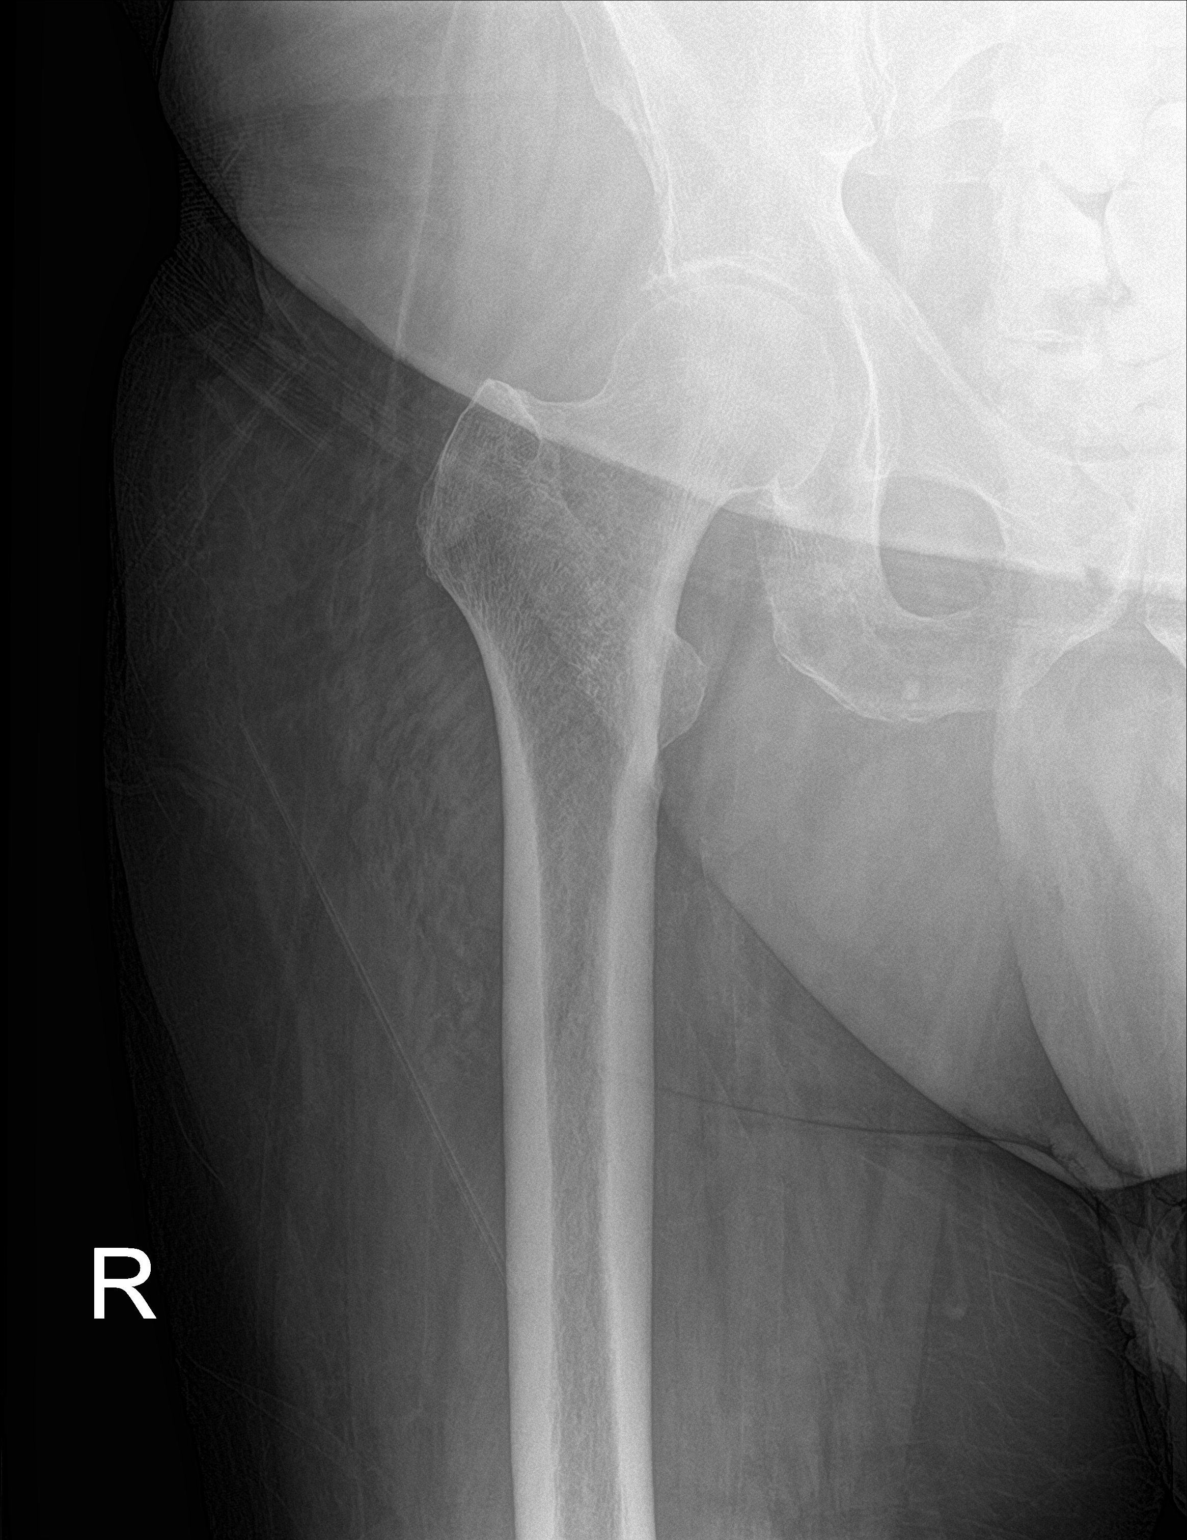

[hip lat]
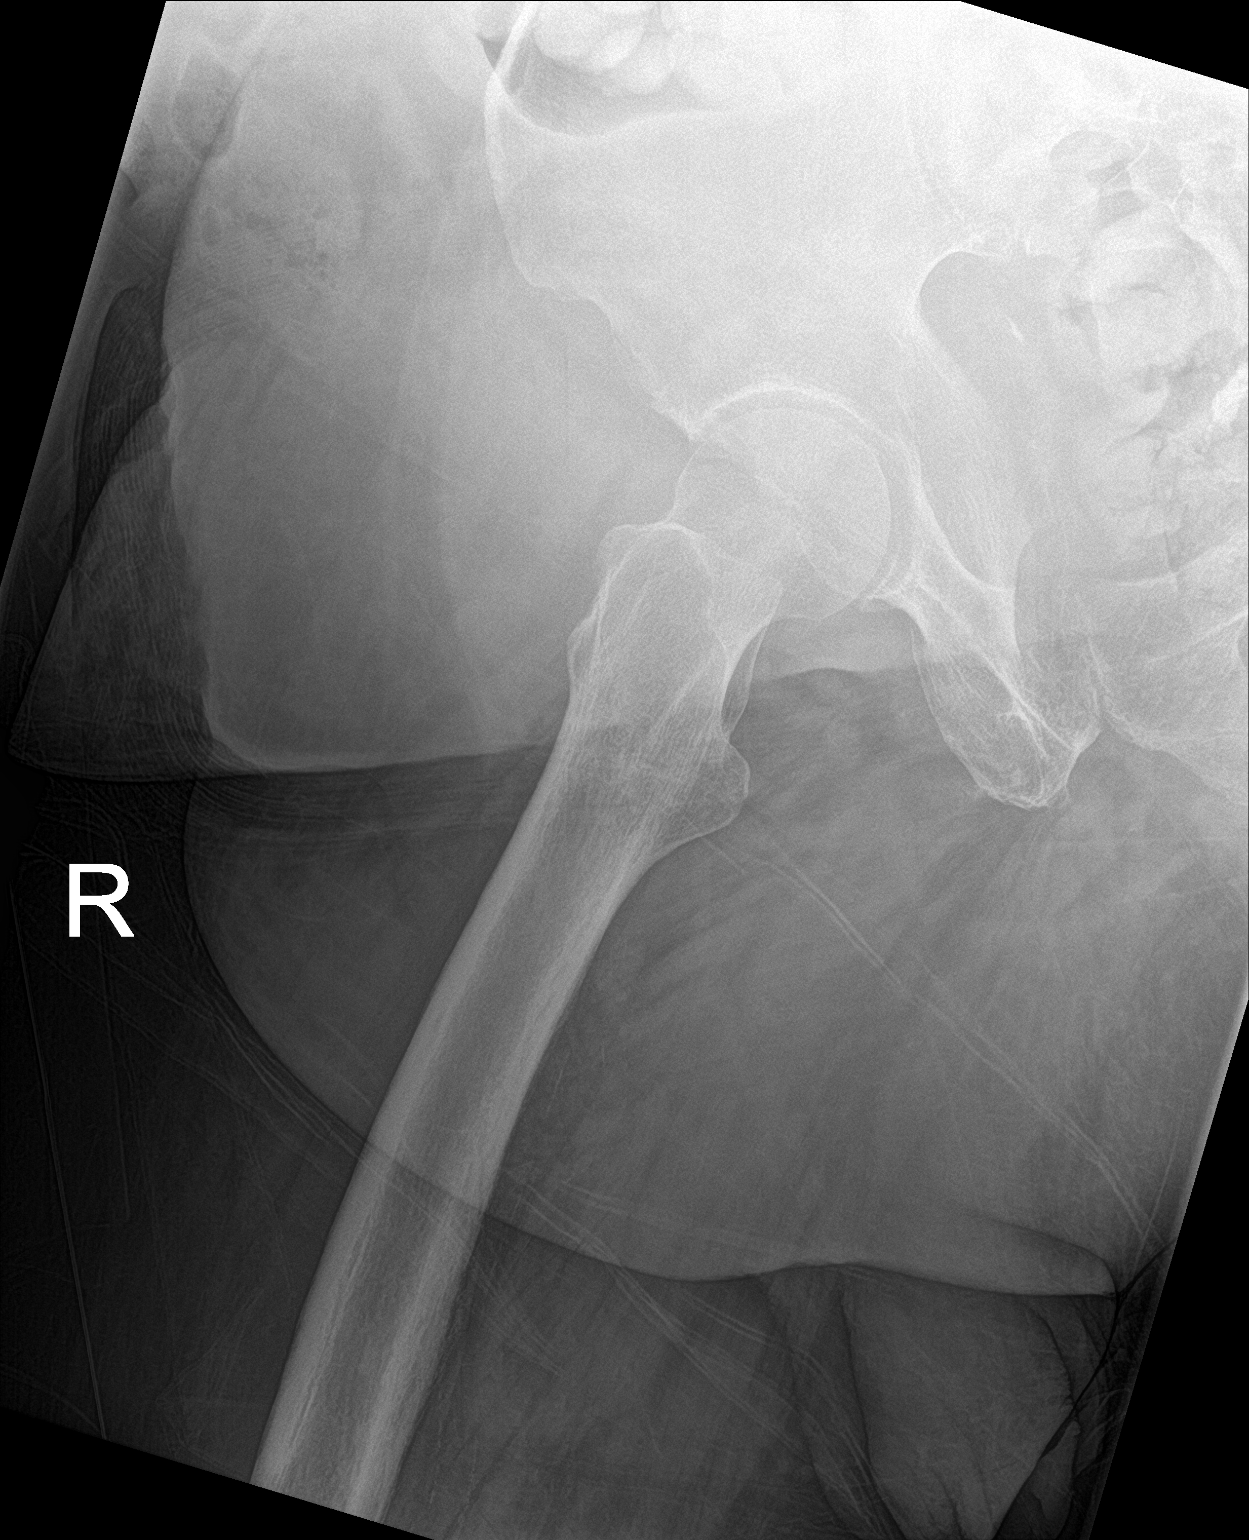

[3 of 3 positions shown; findings below may reference images not displayed]

FINDINGS: No acute fracture of the pelvis or right hip. The femoral head is
well seated. Mild lateral acetabular spurring. Intact pubic rami.
Intact pubic symphysis and sacroiliac joints. Pelvic phleboliths.
IMPRESSION: No fracture of the pelvis or right hip.

## 2021-05-12 IMAGING — DX DG KNEE COMPLETE 4+V*R*
4 series · 4 of 4 positions shown · non-contrast
Comparison: None.

CLINICAL DATA: Fall

EXAM:
RIGHT KNEE - COMPLETE four view

[knee ap]
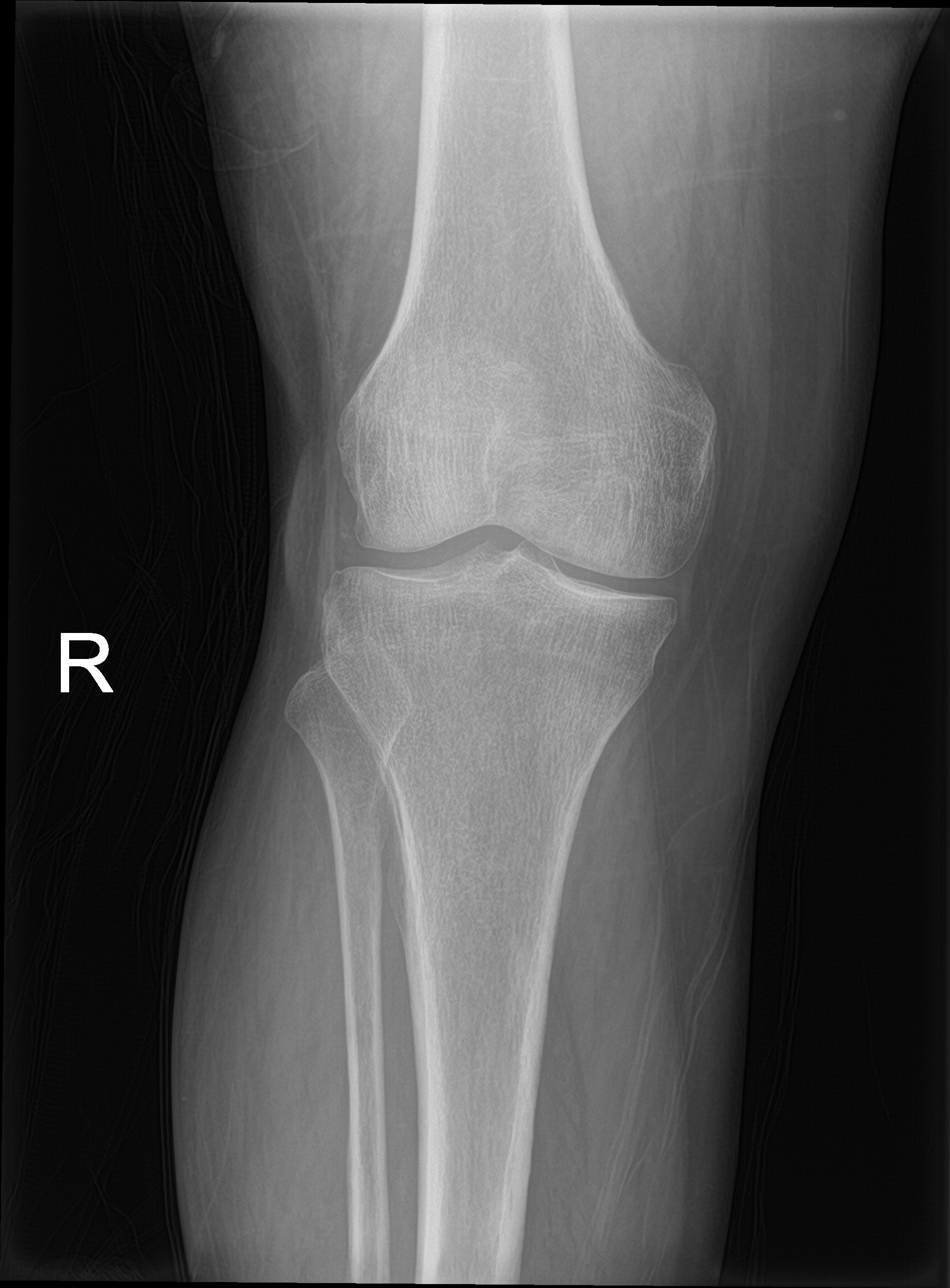

[knee lat]
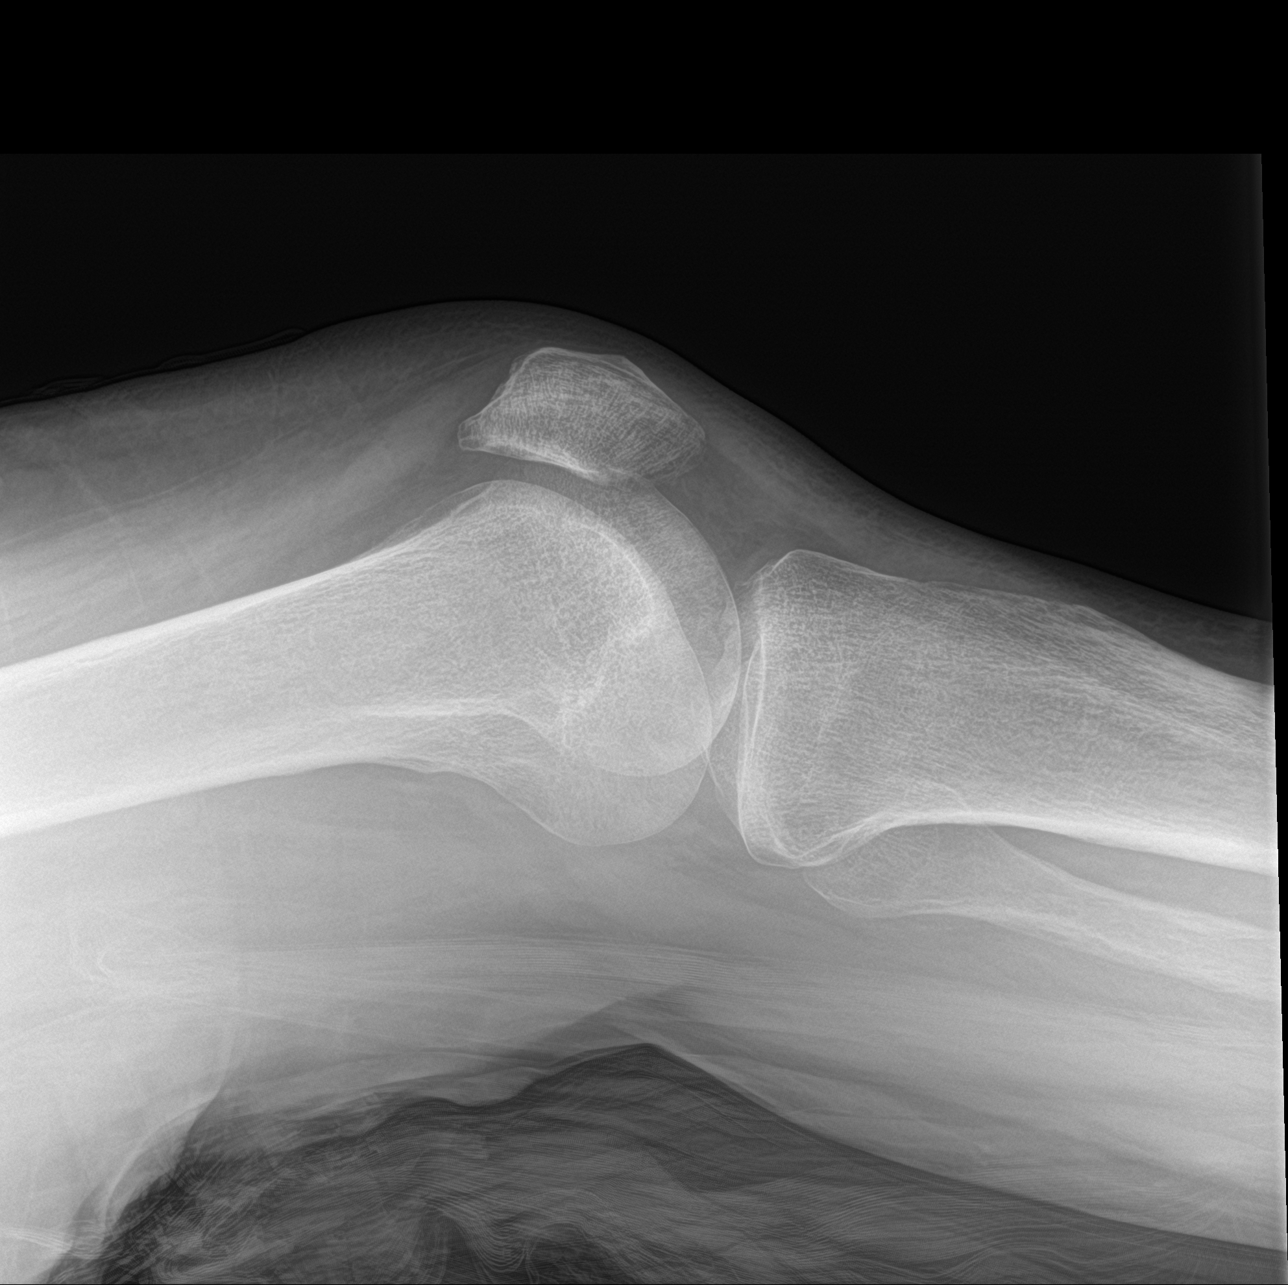

[knee obl (1 of 2)]
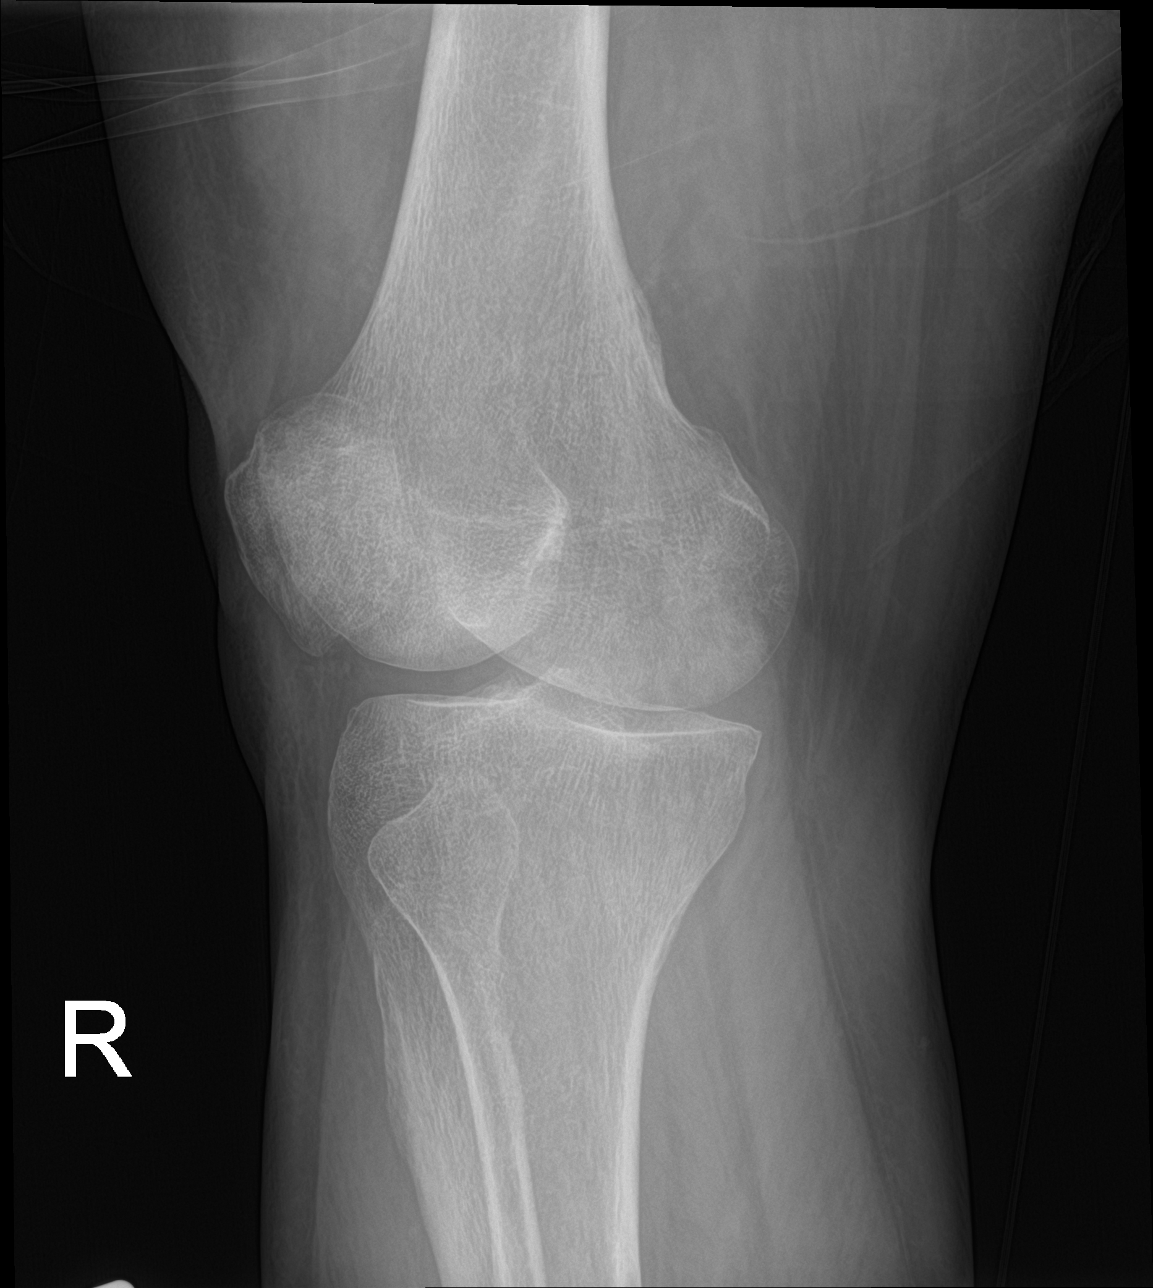

[knee obl (2 of 2)]
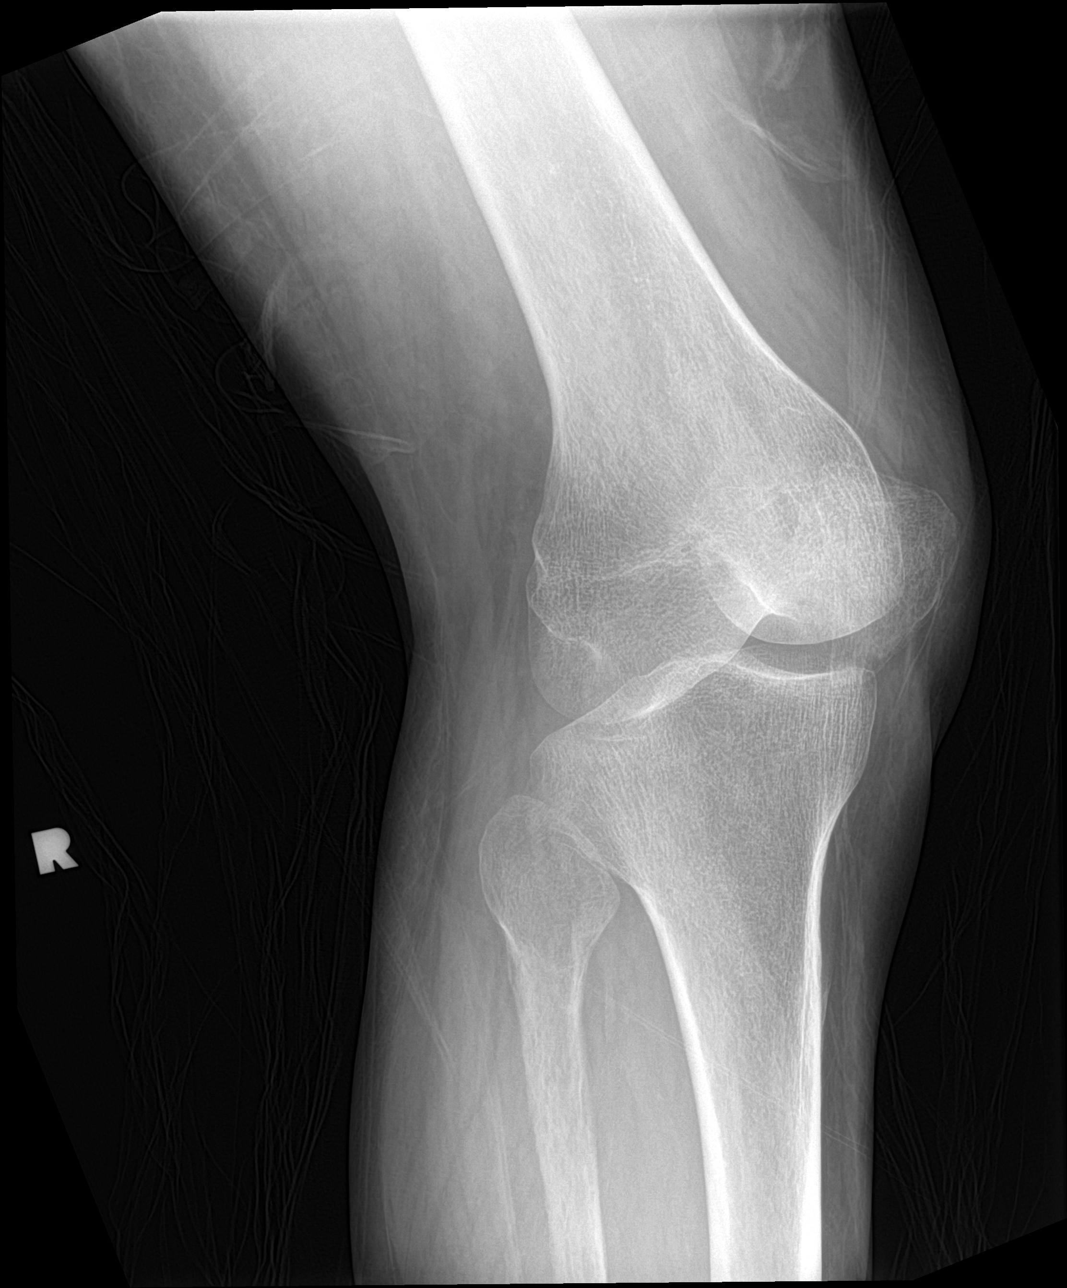

[4 of 4 positions shown; findings below may reference images not displayed]

FINDINGS: No evidence of fracture, dislocation, or joint effusion. No evidence
of severe arthropathy or other focal bone abnormality. Soft tissues
are unremarkable.
IMPRESSION: No acute displaced fracture or dislocation.

## 2021-05-12 IMAGING — DX DG LUMBAR SPINE COMPLETE 4+V
5 series · 5 of 5 positions shown · non-contrast
Comparison: X-ray lumbar spine [DATE]

CLINICAL DATA: Status post fall

EXAM:
LUMBAR SPINE - COMPLETE five view

[l-spine ap]
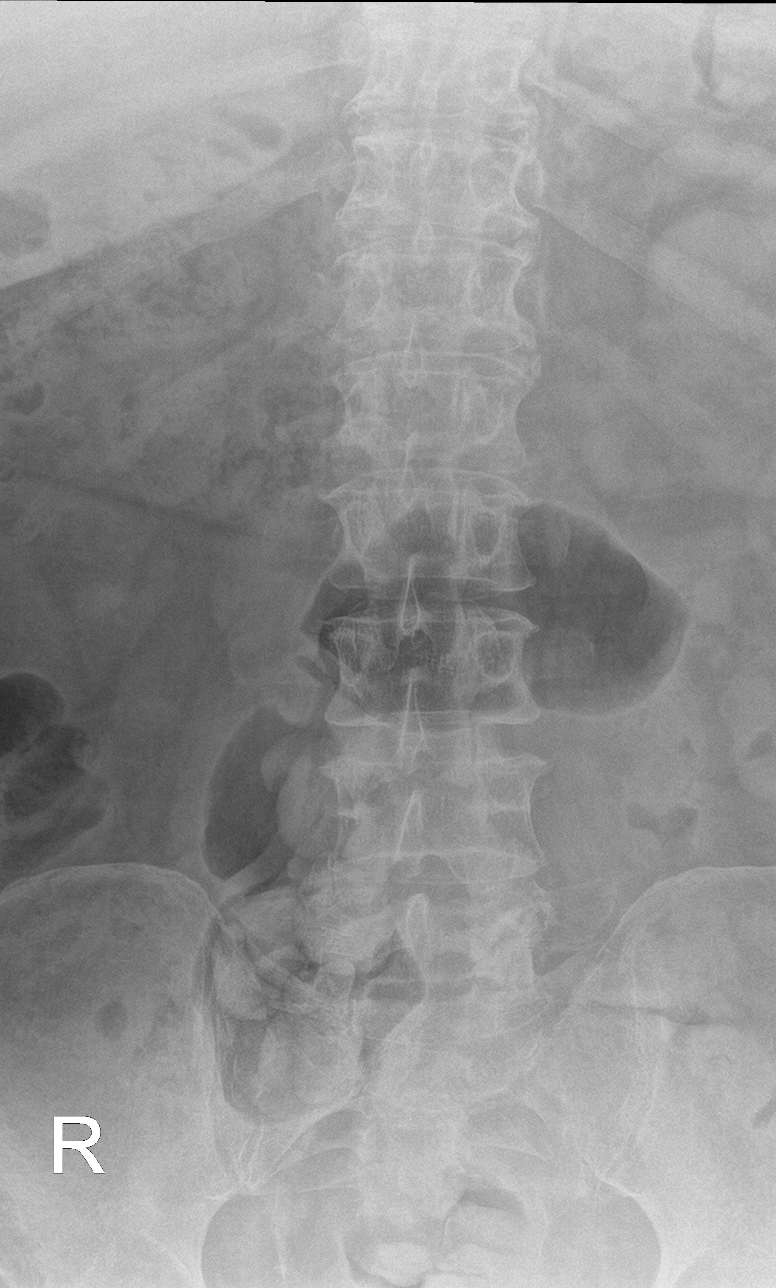

[l-spine obl (1 of 2)]
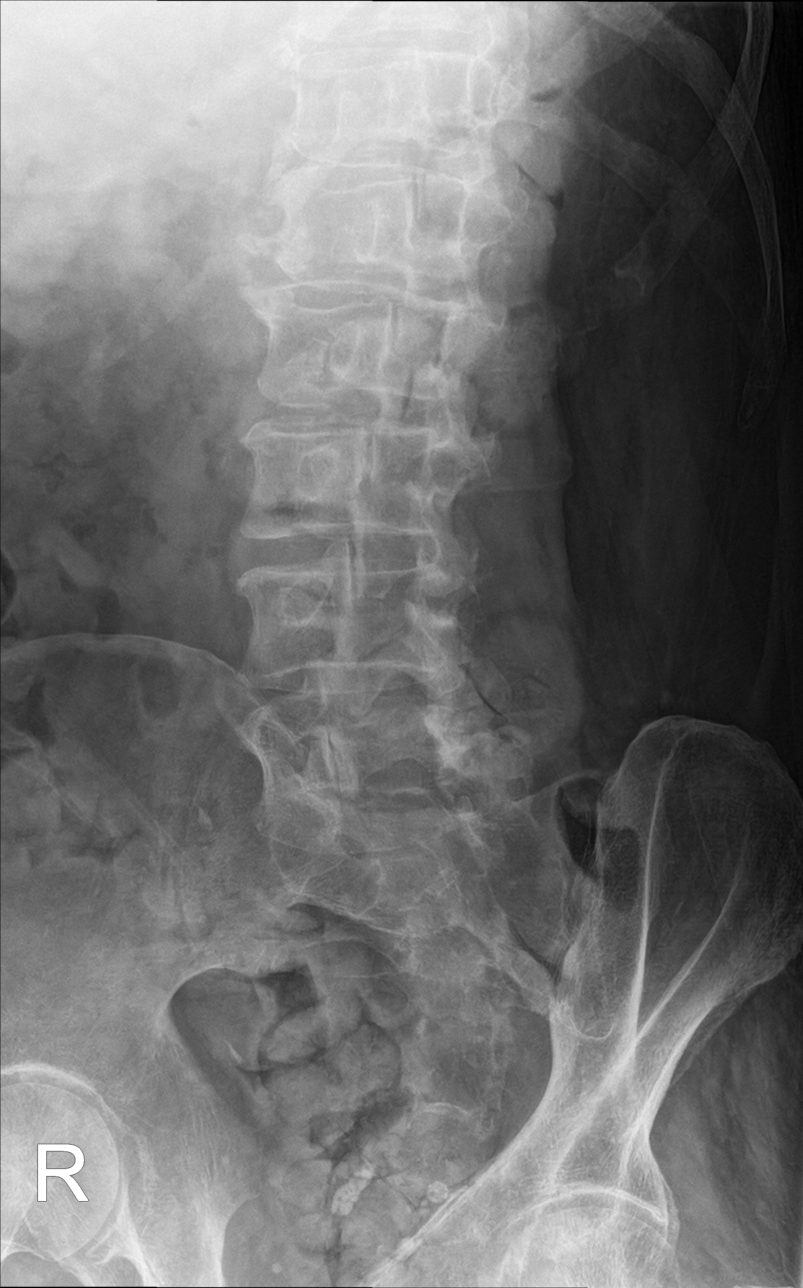

[l-spine obl (2 of 2)]
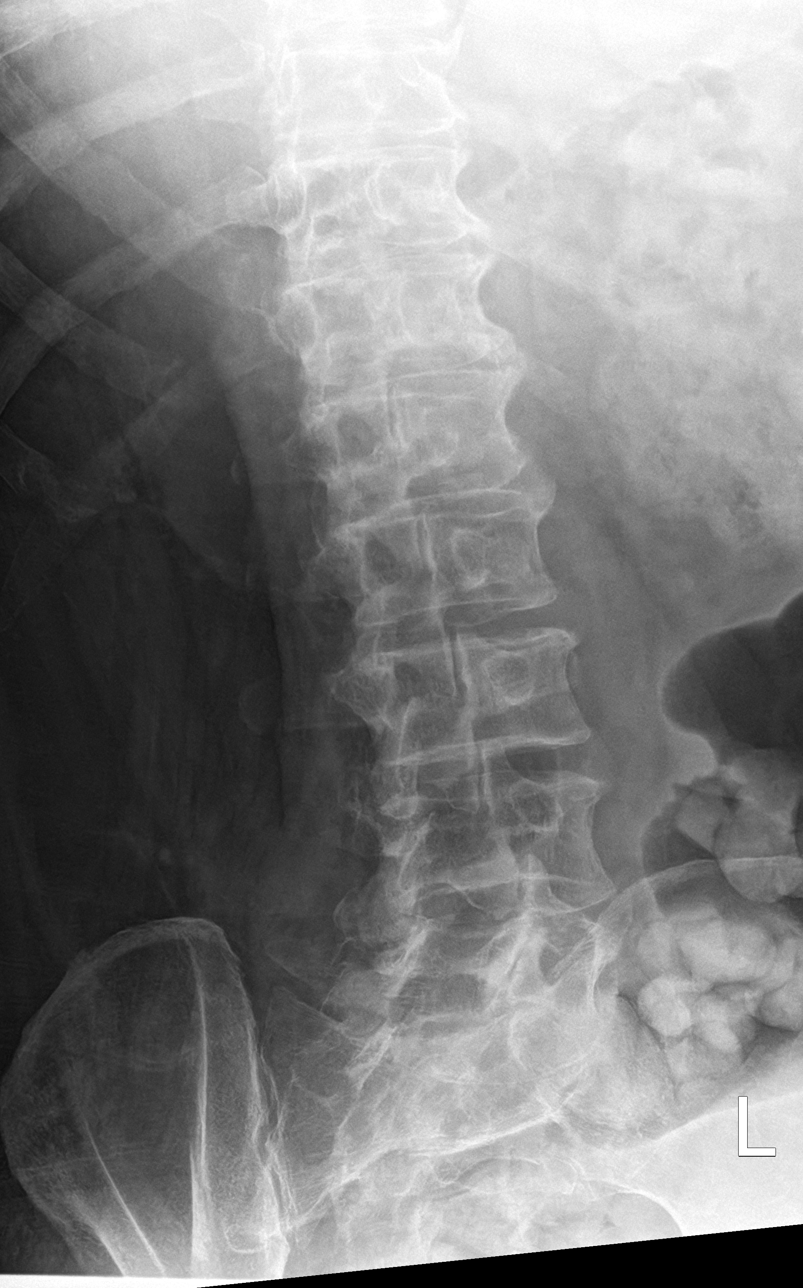

[l-spine lat]
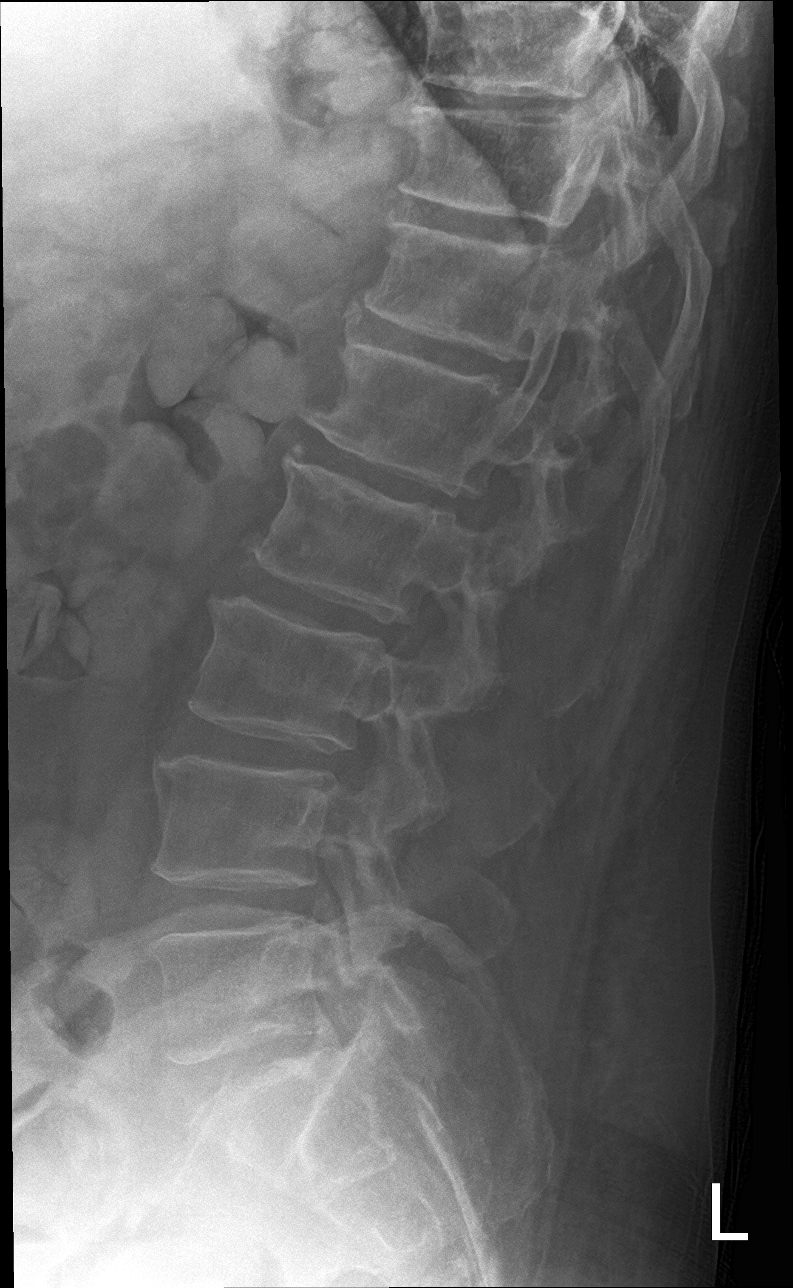

[l-spine spot]
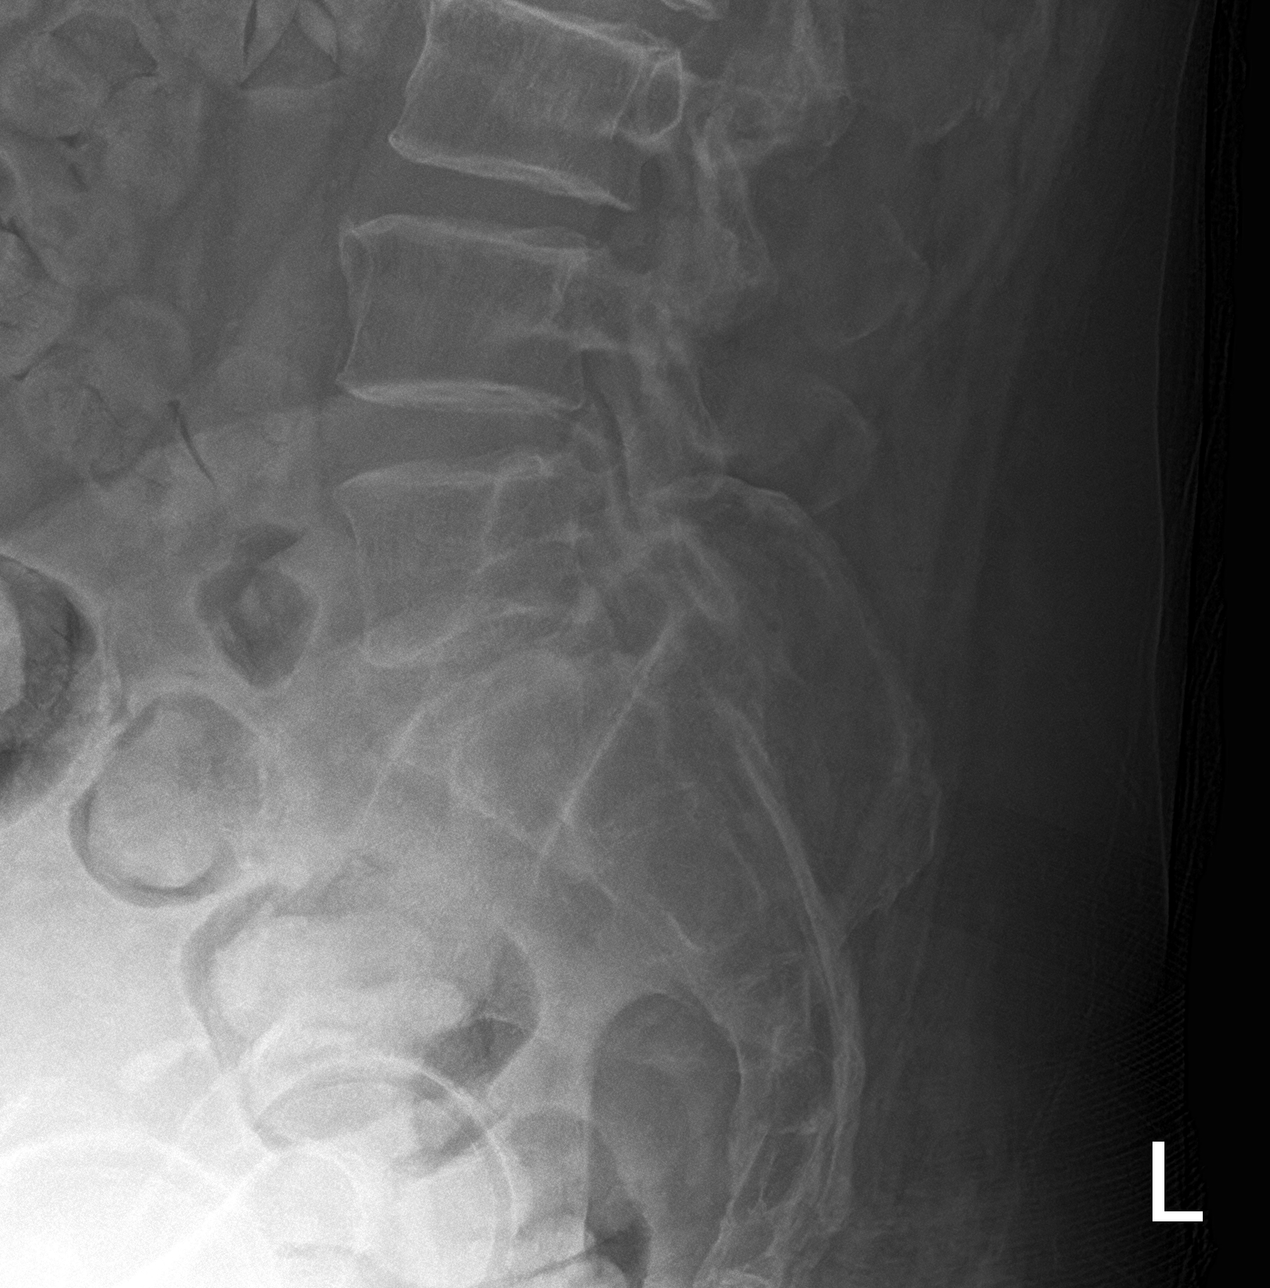

[5 of 5 positions shown; findings below may reference images not displayed]

FINDINGS: Five non-rib-bearing lumbar vertebral bodies are noted. There is no
evidence of lumbar spine fracture. Alignment is normal. At least
mild multilevel degenerative changes of the spine.
IMPRESSION: No acute displaced fracture or traumatic listhesis of the lumbar
spine.

## 2021-05-12 MED ORDER — OXYCODONE-ACETAMINOPHEN 5-325 MG PO TABS
1.0000 | ORAL_TABLET | Freq: Once | ORAL | Status: AC
  Administered 2021-05-12: 1 via ORAL
  Filled 2021-05-12: qty 1

## 2021-05-12 NOTE — ED Triage Notes (Signed)
PTAR report-pt ambulatory-was seen here last night for left foot fx/wearing a boot-pt c/o pain to "right foot all the way up his back"

## 2021-05-12 NOTE — ED Provider Notes (Signed)
[facility] Provider Note   CSN: [id number] Arrival date & time: 05/12/21  2033     History Chief Complaint  Patient presents with   Fall    Tyler Brewer is a 60 y.o. male.   Fall Pertinent negatives include no abdominal pain and no shortness of breath. Patient re-presents after fall yesterday.  States that he twisted his left foot and fell on his knees yesterday.  Was seen in the ER and had imaging of his left side.  States that his right side was not imaged at.  States he has pain in his back going down his right leg.  States it is in his hip and his knee and his thigh.  States he does not feel as if it is in his foot or ankle.  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  He is on pain medicines at home at baseline.  States he often has pain in his legs.  No new numbness weakness.     Past Medical History:  Diagnosis Date   Arthritis    CAD (coronary artery disease)  cardiac cath with moderate disease in a septal branch of the ramus intermedius 04/01/2012   CIDP (chronic inflammatory demyelinating polyneuropathy) ([place])    Depression    Diabetes mellitus    poorly controlled by his report   History of narcotic addiction ([place])    past history of back pain   Hypercholesteremia    Hypertension    IBS (irritable bowel syndrome)    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[place])    Neuropathy    Obesity    Max weight was 390   OSA on CPAP    Panic attacks    Testosterone deficiency    Vertigo     Patient Active Problem List   Diagnosis Date Noted   CIDP (chronic inflammatory demyelinating polyneuropathy) ([place]) 01/12/2021   Diabetes mellitus ([place]) 01/03/2021   Type 2 diabetes mellitus with hyperglycemia, with long-term current use of insulin ([place]) 01/03/2021   Lower extremity edema 11/28/2020   Chronic low back pain 11/28/2020   Psoriatic arthritis ([place]) 11/28/2020   Thoracic aortic aneurysm without rupture ([place])  11/28/2020   Bilateral leg weakness 11/05/2020   Acute left-sided low back pain with right-sided sciatica 07/19/2020   Hip pain, acute, left 07/05/2020   Tremor 07/05/2020   Weakness 07/05/2020   Balance problem 03/11/2020   Tinea cruris 03/11/2020   Cellulitis of left groin 03/11/2020   Acute pain of right shoulder 03/11/2020   Interstitial lung disease ([place]) 05/02/2017   Pansinusitis 09/26/2016   Diabetic polyneuropathy associated with diabetes mellitus due to underlying condition ([place]) 05/08/2016   Methamphetamine use disorder, severe, dependence ([place]) 02/11/2016   Substance induced mood disorder ([place]) 02/11/2016   Exertional dyspnea 11/30/2015   ADD (attention deficit disorder) 09/29/2015   Binge eating 09/29/2015   Syncope 05/05/2012   Diarrhea 05/05/2012   Panic attacks 05/05/2012   OSA (obstructive sleep apnea) 05/05/2012   HTN (hypertension) 05/05/2012   Dyslipidemia 05/05/2012   CAD (coronary artery disease)  cardiac cath with moderate disease in a septal branch of the ramus intermedius 04/01/2012   Chest pain 04/01/2012   Drug abuse and dependence ([place]) 04/01/2012   Family history of coronary artery disease 03/31/2012   Sleep apnea, on C-pap 03/31/2012   Hyperlipemia 03/30/2012   HTN (hypertension), benign 03/30/2012   Morbid obesity ([place]) 03/30/2012   DM type 2, uncontrolled, with neuropathy ([place]) 03/30/2012   Depression  with suicidal ideation 03/30/2012    Past Surgical History:  Procedure Laterality Date   CARDIAC CATHETERIZATION     IR FLUORO GUIDE CV LINE RIGHT  11/10/2020   IR US GUIDE VASC ACCESS RIGHT  11/10/2020   LEFT HEART CATHETERIZATION WITH CORONARY ANGIOGRAM N/A 03/31/2012   Procedure: LEFT HEART CATHETERIZATION WITH CORONARY ANGIOGRAM;  Surgeon: Leonie Man, MD;  Location: [facility] CATH LAB;  Service: Cardiovascular;  Laterality: N/A;       Family History  Problem Relation Age of Onset   Diabetes type II Father    Hypertension Father     Pancreatic disease Father 61       Deceased   Healthy Mother    Healthy Sister    Healthy Son    Healthy Daughter    Parkinson's disease Maternal Grandmother     Social History   Tobacco Use   Smoking status: Former    Packs/day: 0.50    Types: Cigarettes    Quit date: 04/27/2021    Years since quitting: 0.0   Smokeless tobacco: Never  Vaping Use   Vaping Use: Never used  Substance Use Topics   Alcohol use: No   Drug use: Not Currently    Types: Methamphetamines    Home Medications Prior to Admission medications   Medication Sig Start Date End Date Taking? Authorizing Provider  acetaminophen (TYLENOL) 500 MG tablet Take 500-1,000 mg by mouth every 6 (six) hours as needed for mild pain.    [provider]  amLODipine (NORVASC) 10 MG tablet Take 1 tablet (10 mg total) by mouth daily. Patient not taking: Reported on 04/25/2021 01/26/21   Carollee Herter, Alferd Apa, DO  COMFORT EZ INSULIN SYRINGE 31G X 5/16" 1 ML MISC  12/15/20   [provider]  Cyanocobalamin (VITAMIN B-12 PO) Take 1 tablet by mouth daily.    [provider]  diclofenac Sodium (VOLTAREN) 1 % GEL Apply 4 g topically 4 (four) times daily as needed (joint pain).    [provider]  furosemide (LASIX) 20 MG tablet TAKE 1 TABLET(20 MG) BY MOUTH DAILY Patient taking differently: Take 40 mg by mouth daily. 12/21/20   [name], DO  gabapentin (NEURONTIN) 300 MG capsule Take 300 mg by mouth 2 (two) times daily. 02/16/21   [provider]  hydrALAZINE (APRESOLINE) 50 MG tablet Take 1 tablet (50 mg total) by mouth every 8 (eight) hours. 04/20/21   [name], DO  ibuprofen (ADVIL) 200 MG tablet Take 400 mg by mouth every 6 (six) hours as needed for mild pain.    [provider]  insulin NPH Human (NOVOLIN N) 100 UNIT/ML injection Inject 35 Units into the skin at bedtime.    [provider]  Insulin Pen Needle 31G X 5 MM MISC 1 Device by Does not apply  route in the morning, at noon, in the evening, and at bedtime. 01/03/21   [name], Melanie Crazier, MD  Insulin Regular Human (HUMULIN R) 100 UNIT/ML KwikPen Inject 14 Units into the skin 3 (three) times daily before meals. Uses [employer] Relion R insulin    [provider]  losartan (COZAAR) 100 MG tablet TAKE ONE TABLET BY MOUTH DAILY Patient taking differently: Take 100 mg by mouth daily. 04/18/21   [name] R, DO  MAGNESIUM PO Take 1 tablet by mouth daily.    [provider]  meclizine (ANTIVERT) 25 MG tablet Take 1 tablet (25 mg total) by mouth  3 (three) times daily as needed for dizziness. 02/18/21   Molpus, John, MD  meloxicam (MOBIC) 15 MG tablet Take 1 tablet (15 mg total) by mouth daily. 03/14/21   Kirsteins, Luanna Salk, MD  Menthol, Topical Analgesic, (ICY HOT EX) Apply 1 application topically as needed (pain).    [provider]  metFORMIN (GLUCOPHAGE-XR) 500 MG 24 hr tablet Take 2 tablets (1,000 mg total) by mouth in the morning and at bedtime. 01/26/21   [name], DO  methocarbamol (ROBAXIN) 500 MG tablet Take 500 mg by mouth 4 (four) times daily as needed for muscle spasms.    [provider]  Multiple Vitamin (MULTIVITAMIN) tablet Take 1 tablet by mouth daily.    [provider]  pravastatin (PRAVACHOL) 40 MG tablet Take 1 tablet (40 mg total) by mouth daily. 01/26/21   [name], DO  pregabalin (LYRICA) 75 MG capsule Take 1 capsule (75 mg total) by mouth 2 (two) times daily. 03/14/21   Kirsteins, Luanna Salk, MD  tamsulosin (FLOMAX) 0.4 MG CAPS capsule TAKE ONE CAPSULE BY MOUTH ONE TIME DAILY Patient taking differently: Take 0.4 mg by mouth daily. 04/18/21   [name], DO  traMADol (ULTRAM) 50 MG tablet 2 po q6 h prn Patient taking differently: Take 100 mg by mouth every 6 (six) hours as needed for moderate pain. 02/03/21   [name], DO    Allergies    Patient has no known  allergies.  Review of Systems   Review of Systems  Constitutional:  Positive for appetite change.  HENT:  Negative for congestion.   Respiratory:  Negative for shortness of breath.   Cardiovascular:  Positive for leg swelling.  Gastrointestinal:  Negative for abdominal pain.  Genitourinary:  Positive for flank pain.  Musculoskeletal:  Positive for back pain.  Skin:  Negative for wound.  Neurological:  Positive for numbness.  Psychiatric/Behavioral:  Negative for confusion.    Physical Exam Updated Vital Signs BP 104/87 (BP Location: Right Arm)   Pulse 95   Temp 98.6 F (37 C) (Oral)   Resp 20   Ht 5\' 11"  (1.803 m)   Wt 131.5 kg   SpO2 99%   BMI 40.45 kg/m   Physical Exam Vitals and nursing note reviewed.  Constitutional:      Appearance: Normal appearance.  HENT:     Head: Normocephalic.  Cardiovascular:     Rate and Rhythm: Regular rhythm.  Abdominal:     Tenderness: There is no abdominal tenderness.  Musculoskeletal:        General: Tenderness present.     Comments: Tenderness to upper lumbar spine.  No deformity.  Tenderness to paraspinal area also.  Tenderness to right hip laterally without deformity.  Tenderness to right knee.  Tenderness to right thigh but no deformity.  Some distal edema.  No tenderness distal to the knee.  Skin:    Capillary Refill: Capillary refill takes less than 2 seconds.  Neurological:     Mental Status: He is alert and oriented to person, place, and time.    ED Results / Procedures / Treatments   Labs (all labs ordered are listed, but only abnormal results are displayed) Labs Reviewed - No data to display  EKG None  Radiology DG Ankle Complete Left  Result Date: 05/11/2021 CLINICAL DATA:  Status post fall. EXAM: LEFT ANKLE COMPLETE - 3+ VIEW COMPARISON:  None. FINDINGS: Acute, nondisplaced fracture is seen involving the left  medial malleolus. There is no evidence of dislocation. Mild diffuse soft tissue swelling is noted.  IMPRESSION: Acute fracture of the left medial malleolus. Electronically Signed   By: Virgina Norfolk M.D.   On: 05/11/2021 22:02   DG Knee Complete 4 Views Left  Result Date: 05/11/2021 CLINICAL DATA:  Status post fall. EXAM: LEFT KNEE - COMPLETE 4+ VIEW COMPARISON:  None. FINDINGS: No evidence of fracture, dislocation, or joint effusion. No evidence of arthropathy or other focal bone abnormality. Soft tissues are unremarkable. IMPRESSION: Negative. Electronically Signed   By: Virgina Norfolk M.D.   On: 05/11/2021 21:56   DG Foot Complete Left  Result Date: 05/11/2021 CLINICAL DATA:  Status post fall. EXAM: LEFT FOOT - COMPLETE 3+ VIEW COMPARISON:  None. FINDINGS: Acute fracture deformity is seen involving the left medial malleolus. A thin ill-defined cortical lucency is seen involving the medial aspect of the base of the proximal phalanx of the left great toe. There is no evidence of dislocation. Mild degenerative changes are noted along the mid left foot. Moderate severity diffuse soft tissue swelling is seen. This is most prominent along the dorsal aspect of the mid to distal left foot. IMPRESSION: 1. Acute fracture of the left medial malleolus. 2. Additional findings which may represent a nondisplaced fracture of the proximal phalanx of the left great toe. Correlation with physical examination of this region is recommended to determine the presence of point tenderness. Electronically Signed   By: Virgina Norfolk M.D.   On: 05/11/2021 22:01    Procedures Procedures   Medications Ordered in ED Medications  oxyCODONE-acetaminophen (PERCOCET/ROXICET) 5-325 MG per tablet 1 tablet (1 tablet Oral Given 05/12/21 2200)    ED Course  I have reviewed the triage vital signs and the nursing notes.  Pertinent labs & imaging results that were available during my care of the patient were reviewed by me and considered in my medical decision making (see chart for details).    MDM  Rules/Calculators/A&P                           Patient with fall 2 days ago.  Had been seen but only imaged on the contralateral side.  Now right side pain.  X-rays done reassuring.  Discharge home with patient on pain medicines.  Do not see fracture.  Doubt severe intra-abdominal injury. Final Clinical Impression(s) / ED Diagnoses Final diagnoses:  None    Rx / DC Orders ED Discharge Orders     None        Davonna Belling, MD 05/12/21 2355

## 2021-05-12 NOTE — ED Triage Notes (Addendum)
Pt states he fell last night-pain to right flank down to right foot-cam walker noted left LE-NAD-to triage in w/c

## 2021-05-12 NOTE — ED Notes (Signed)
After triage pt notified of wait-states he needs to lie down-advised there is no where for pt to lie down-pt to ED [place] via w/c-attempted to stand and sit on couch with 3 person stand by-pt unable to stand-PTAR reported pt walked in his home-pt states he will stay seated in w/c-charge RN advised pt to not attempt to get out of w/c-pt agreed

## 2021-05-13 DIAGNOSIS — Z743 Need for continuous supervision: Secondary | ICD-10-CM | POA: Diagnosis not present

## 2021-05-13 DIAGNOSIS — M25572 Pain in left ankle and joints of left foot: Secondary | ICD-10-CM | POA: Diagnosis not present

## 2021-05-13 DIAGNOSIS — M549 Dorsalgia, unspecified: Secondary | ICD-10-CM | POA: Diagnosis not present

## 2021-05-13 DIAGNOSIS — M25551 Pain in right hip: Secondary | ICD-10-CM | POA: Diagnosis not present

## 2021-05-13 DIAGNOSIS — M545 Low back pain, unspecified: Secondary | ICD-10-CM | POA: Diagnosis not present

## 2021-05-14 ENCOUNTER — Other Ambulatory Visit: Payer: Self-pay

## 2021-05-14 ENCOUNTER — Emergency Department (HOSPITAL_COMMUNITY)
Admission: EM | Admit: 2021-05-14 | Discharge: 2021-05-15 | Disposition: A | Discharge status: Home / Self Care | Payer: Medicare HMO | Attending: Emergency Medicine | Admitting: Emergency Medicine

## 2021-05-14 DIAGNOSIS — Z7984 Long term (current) use of oral hypoglycemic drugs: Secondary | ICD-10-CM | POA: Diagnosis not present

## 2021-05-14 DIAGNOSIS — R739 Hyperglycemia, unspecified: Secondary | ICD-10-CM | POA: Diagnosis not present

## 2021-05-14 DIAGNOSIS — Z87891 Personal history of nicotine dependence: Secondary | ICD-10-CM | POA: Insufficient documentation

## 2021-05-14 DIAGNOSIS — Z79899 Other long term (current) drug therapy: Secondary | ICD-10-CM | POA: Diagnosis not present

## 2021-05-14 DIAGNOSIS — I10 Essential (primary) hypertension: Secondary | ICD-10-CM | POA: Insufficient documentation

## 2021-05-14 DIAGNOSIS — R1084 Generalized abdominal pain: Secondary | ICD-10-CM | POA: Diagnosis not present

## 2021-05-14 DIAGNOSIS — Z794 Long term (current) use of insulin: Secondary | ICD-10-CM | POA: Diagnosis not present

## 2021-05-14 DIAGNOSIS — E1142 Type 2 diabetes mellitus with diabetic polyneuropathy: Secondary | ICD-10-CM | POA: Insufficient documentation

## 2021-05-14 DIAGNOSIS — I2510 Atherosclerotic heart disease of native coronary artery without angina pectoris: Secondary | ICD-10-CM | POA: Diagnosis not present

## 2021-05-14 DIAGNOSIS — R109 Unspecified abdominal pain: Secondary | ICD-10-CM | POA: Diagnosis not present

## 2021-05-14 DIAGNOSIS — Z743 Need for continuous supervision: Secondary | ICD-10-CM | POA: Diagnosis not present

## 2021-05-14 DIAGNOSIS — K5900 Constipation, unspecified: Secondary | ICD-10-CM | POA: Insufficient documentation

## 2021-05-14 NOTE — ED Triage Notes (Signed)
Pt to ED via [facility] from home with c/o abdominal pain x 3 days.  Pt attempted a fleets enema today without much relief.  "My stool is like concrete.  I used a fleets enema, a little bit, then a little bit, then a lot a bit.  What I got out I had to dig out.  I'm on a lot of pain killers."

## 2021-05-15 ENCOUNTER — Encounter (HOSPITAL_COMMUNITY): Payer: Self-pay

## 2021-05-15 ENCOUNTER — Ambulatory Visit: Payer: Medicare HMO | Admitting: Family Medicine

## 2021-05-15 ENCOUNTER — Emergency Department (HOSPITAL_COMMUNITY): Payer: Medicare HMO

## 2021-05-15 DIAGNOSIS — F410 Panic disorder [episodic paroxysmal anxiety] without agoraphobia: Secondary | ICD-10-CM | POA: Diagnosis not present

## 2021-05-15 DIAGNOSIS — K5900 Constipation, unspecified: Secondary | ICD-10-CM | POA: Diagnosis not present

## 2021-05-15 DIAGNOSIS — G8929 Other chronic pain: Secondary | ICD-10-CM | POA: Diagnosis not present

## 2021-05-15 DIAGNOSIS — E1121 Type 2 diabetes mellitus with diabetic nephropathy: Secondary | ICD-10-CM | POA: Diagnosis not present

## 2021-05-15 DIAGNOSIS — E1142 Type 2 diabetes mellitus with diabetic polyneuropathy: Secondary | ICD-10-CM | POA: Diagnosis not present

## 2021-05-15 DIAGNOSIS — J849 Interstitial pulmonary disease, unspecified: Secondary | ICD-10-CM | POA: Diagnosis not present

## 2021-05-15 DIAGNOSIS — N401 Enlarged prostate with lower urinary tract symptoms: Secondary | ICD-10-CM | POA: Diagnosis not present

## 2021-05-15 DIAGNOSIS — Z87891 Personal history of nicotine dependence: Secondary | ICD-10-CM | POA: Diagnosis not present

## 2021-05-15 DIAGNOSIS — M1990 Unspecified osteoarthritis, unspecified site: Secondary | ICD-10-CM | POA: Diagnosis not present

## 2021-05-15 DIAGNOSIS — E7800 Pure hypercholesterolemia, unspecified: Secondary | ICD-10-CM | POA: Diagnosis not present

## 2021-05-15 DIAGNOSIS — F32A Depression, unspecified: Secondary | ICD-10-CM | POA: Diagnosis not present

## 2021-05-15 DIAGNOSIS — K589 Irritable bowel syndrome without diarrhea: Secondary | ICD-10-CM | POA: Diagnosis not present

## 2021-05-15 DIAGNOSIS — R109 Unspecified abdominal pain: Secondary | ICD-10-CM | POA: Diagnosis not present

## 2021-05-15 DIAGNOSIS — E1136 Type 2 diabetes mellitus with diabetic cataract: Secondary | ICD-10-CM | POA: Diagnosis not present

## 2021-05-15 DIAGNOSIS — M5441 Lumbago with sciatica, right side: Secondary | ICD-10-CM | POA: Diagnosis not present

## 2021-05-15 DIAGNOSIS — N39498 Other specified urinary incontinence: Secondary | ICD-10-CM | POA: Diagnosis not present

## 2021-05-15 DIAGNOSIS — Z6841 Body Mass Index (BMI) 40.0 and over, adult: Secondary | ICD-10-CM | POA: Diagnosis not present

## 2021-05-15 DIAGNOSIS — G4733 Obstructive sleep apnea (adult) (pediatric): Secondary | ICD-10-CM | POA: Diagnosis not present

## 2021-05-15 DIAGNOSIS — Z794 Long term (current) use of insulin: Secondary | ICD-10-CM | POA: Diagnosis not present

## 2021-05-15 DIAGNOSIS — I10 Essential (primary) hypertension: Secondary | ICD-10-CM | POA: Diagnosis not present

## 2021-05-15 DIAGNOSIS — Z9181 History of falling: Secondary | ICD-10-CM | POA: Diagnosis not present

## 2021-05-15 DIAGNOSIS — F988 Other specified behavioral and emotional disorders with onset usually occurring in childhood and adolescence: Secondary | ICD-10-CM | POA: Diagnosis not present

## 2021-05-15 DIAGNOSIS — I2510 Atherosclerotic heart disease of native coronary artery without angina pectoris: Secondary | ICD-10-CM | POA: Diagnosis not present

## 2021-05-15 DIAGNOSIS — Z743 Need for continuous supervision: Secondary | ICD-10-CM | POA: Diagnosis not present

## 2021-05-15 LAB — COMPREHENSIVE METABOLIC PANEL
ALT: 26 U/L (ref 0–44)
AST: 18 U/L (ref 15–41)
Albumin: 3.7 g/dL (ref 3.5–5.0)
Alkaline Phosphatase: 66 U/L (ref 38–126)
Anion gap: 12 (ref 5–15)
BUN: 23 mg/dL — ABNORMAL HIGH (ref 6–20)
CO2: 26 mmol/L (ref 22–32)
Calcium: 9.6 mg/dL (ref 8.9–10.3)
Chloride: 95 mmol/L — ABNORMAL LOW (ref 98–111)
Creatinine, Ser: 1.05 mg/dL (ref 0.61–1.24)
GFR, Estimated: >60 mL/min (ref >60)
Glucose, Bld: 315 mg/dL — ABNORMAL HIGH (ref 70–99)
Potassium: 3.8 mmol/L (ref 3.5–5.1)
Sodium: 133 mmol/L — ABNORMAL LOW (ref 135–145)
Total Bilirubin: 0.6 mg/dL (ref 0.3–1.2)
Total Protein: 8.1 g/dL (ref 6.5–8.1)

## 2021-05-15 LAB — CBC WITH DIFFERENTIAL/PLATELET
Abs Immature Granulocytes: 0.03 10*3/uL (ref 0.00–0.07)
Basophils Absolute: 0.1 10*3/uL (ref 0.0–0.1)
Basophils Relative: 1 %
Eosinophils Absolute: 0.1 10*3/uL (ref 0.0–0.5)
Eosinophils Relative: 1 %
HCT: 41.3 % (ref 39.0–52.0)
Hemoglobin: 13.7 g/dL (ref 13.0–17.0)
Immature Granulocytes: 1 %
Lymphocytes Relative: 15 %
Lymphs Abs: 1 10*3/uL (ref 0.7–4.0)
MCH: 29.4 pg (ref 26.0–34.0)
MCHC: 33.2 g/dL (ref 30.0–36.0)
MCV: 88.6 fL (ref 80.0–100.0)
Monocytes Absolute: 0.7 10*3/uL (ref 0.1–1.0)
Monocytes Relative: 10 %
Neutro Abs: 4.7 10*3/uL (ref 1.7–7.7)
Neutrophils Relative %: 72 %
Platelets: 268 10*3/uL (ref 150–400)
RBC: 4.66 MIL/uL (ref 4.22–5.81)
RDW: 12.4 % (ref 11.5–15.5)
WBC: 6.6 10*3/uL (ref 4.0–10.5)
nRBC: 0 % (ref 0.0–0.2)

## 2021-05-15 LAB — LIPASE, BLOOD: Lipase: 31 U/L (ref 11–51)

## 2021-05-15 IMAGING — CT CT ABD-PELV W/ CM
2 of 5 series · 16 of 46 positions shown, 18 images · IV contrast (omnipaque)
Comparison: [DATE] .

CLINICAL DATA: Constipation, abdominal pain

EXAM:
CT ABDOMEN AND PELVIS WITH CONTRAST
TECHNIQUE: Multidetector CT imaging of the abdomen and pelvis was performed
using the standard protocol following bolus administration of
intravenous contrast.
CONTRAST:  80mL OMNIPAQUE IOHEXOL 350 MG/ML SOLN

[Series 2: axial st · axial · 0.98mm/px · z∈[-544,-74]mm · 13 of 108 slices shown, 15 images]
[im 7/108  soft-tissue]
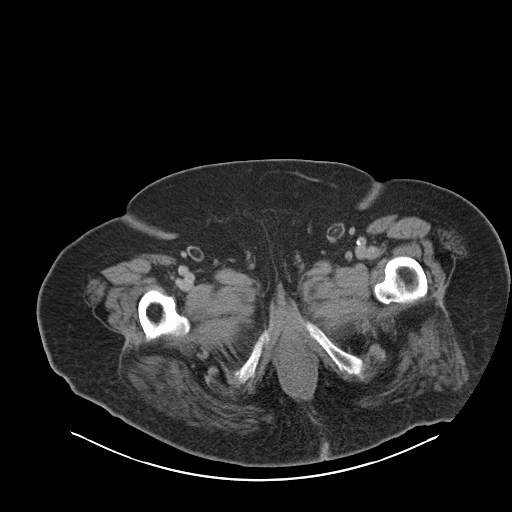
[im 7/108  bone]
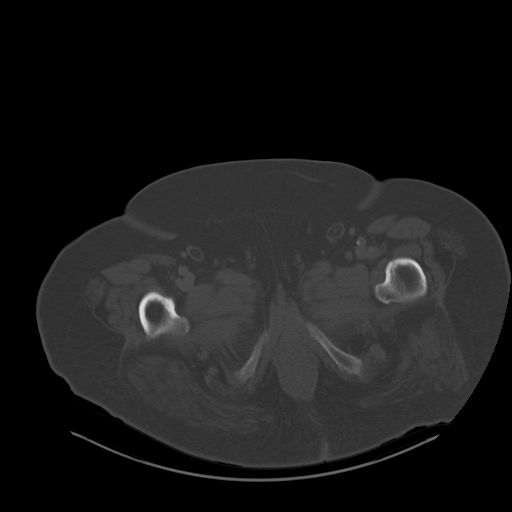
[im 14/108  soft-tissue]
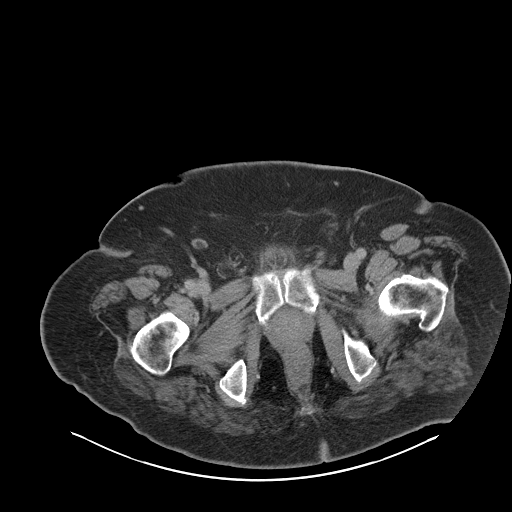
[im 21/108  soft-tissue]
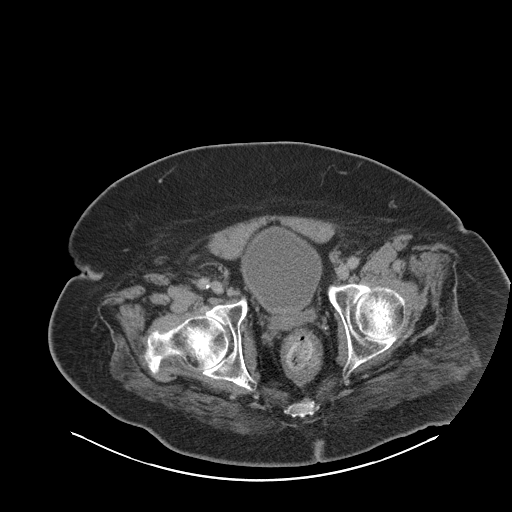
[im 34/108  soft-tissue]
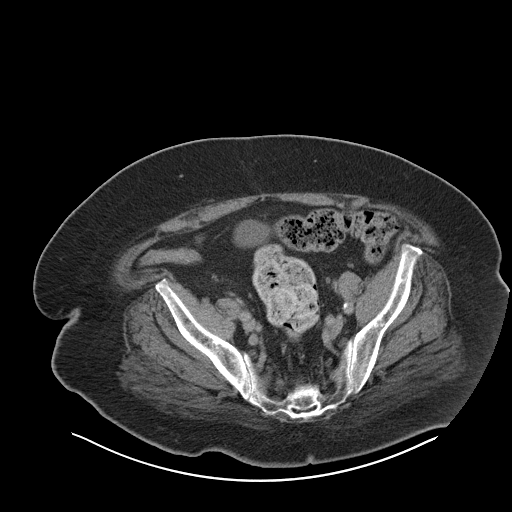
[im 41/108  soft-tissue]
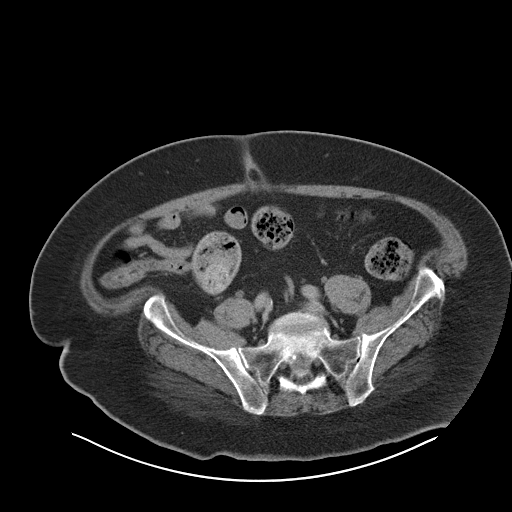
[im 47/108  soft-tissue]
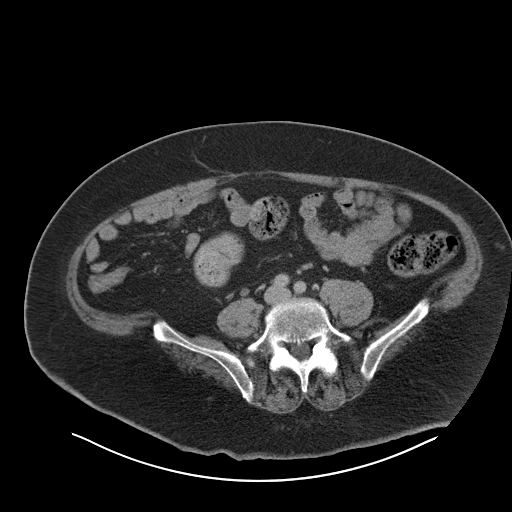
[im 54/108  soft-tissue]
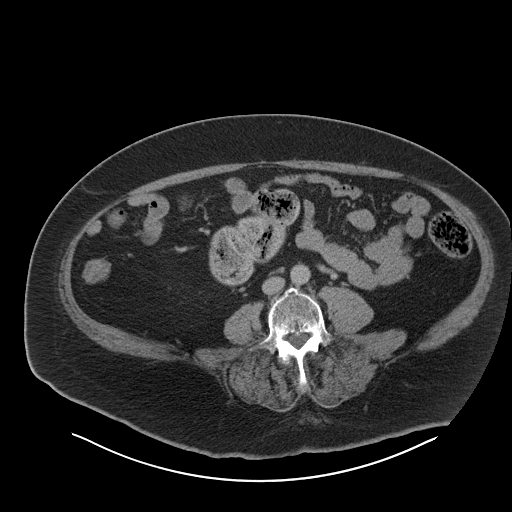
[im 61/108  soft-tissue]
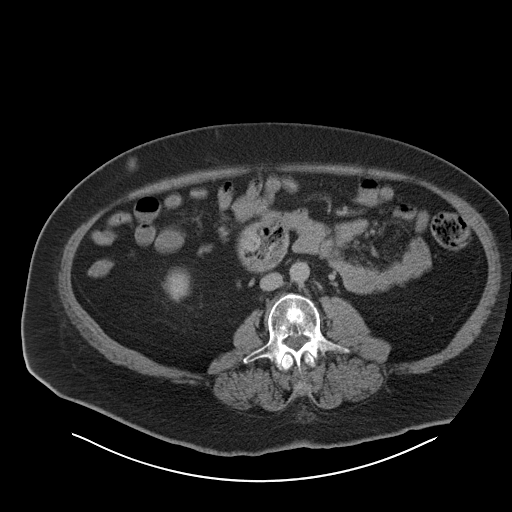
[im 67/108  soft-tissue]
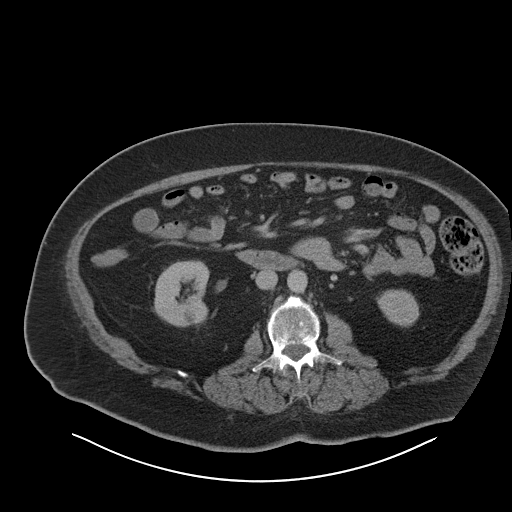
[im 67/108  bone]
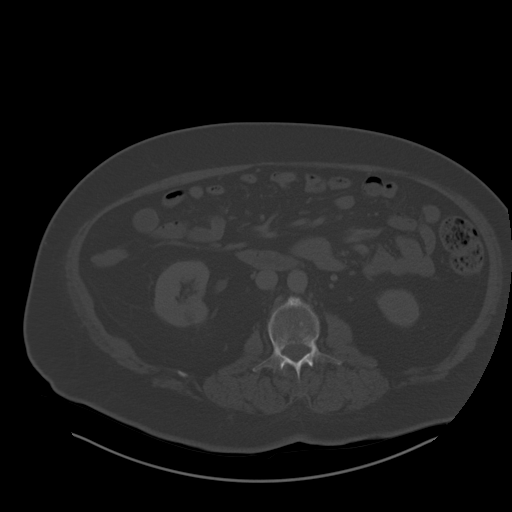
[im 74/108  soft-tissue]
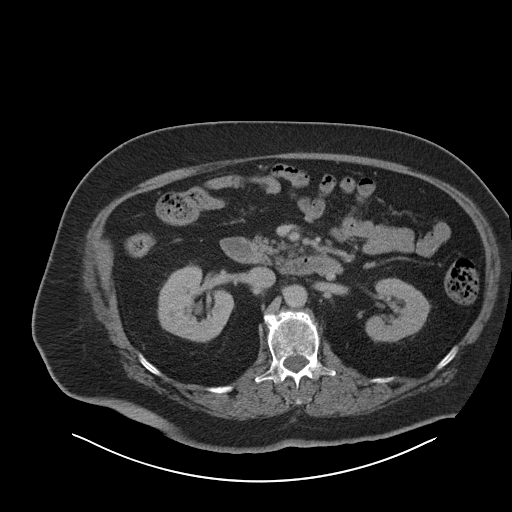
[im 87/108  soft-tissue]
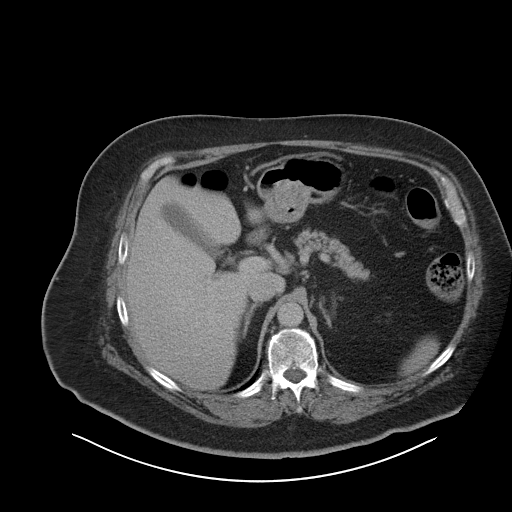
[im 94/108  soft-tissue]
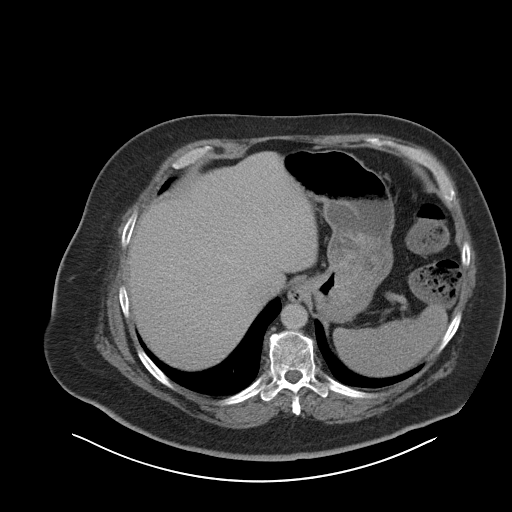
[im 101/108  soft-tissue]
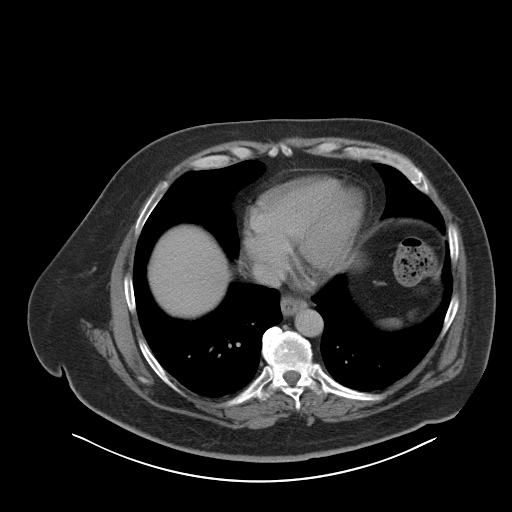

[Series 5: coronal st · coronal · 1.01mm/px · 3 of 194 slices shown]
[im 65/194  soft-tissue]
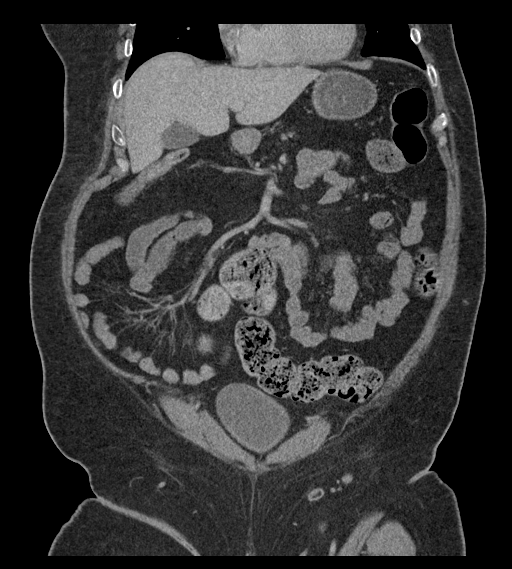
[im 86/194  soft-tissue]
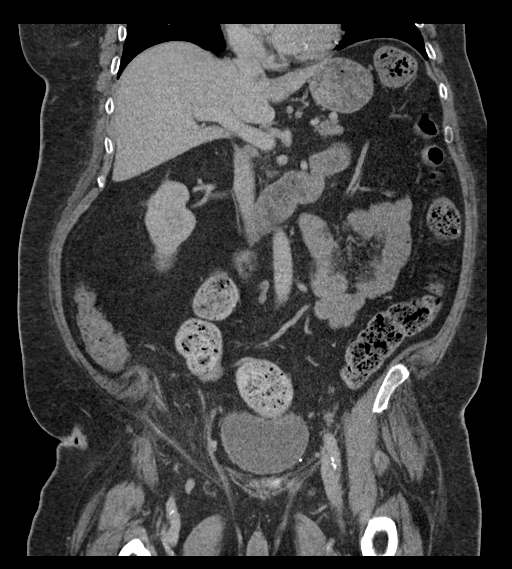
[im 108/194  soft-tissue]
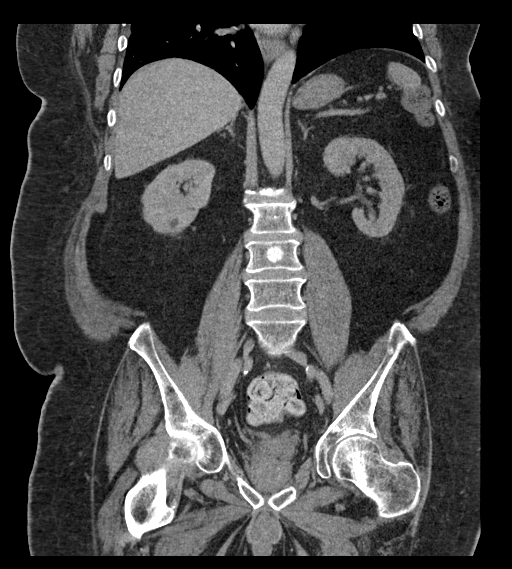

[16 of 46 positions shown; findings below may reference images not displayed]

FINDINGS: Lower chest: The visualized lung bases are clear. Moderate
multi-vessel coronary artery calcification. Global cardiac size
within normal limits

Hepatobiliary: No focal liver abnormality is seen. No gallstones,
gallbladder wall thickening, or biliary dilatation.

Pancreas: Unremarkable

Spleen: Unremarkable

Adrenals/Urinary Tract: The adrenal glands are unremarkable. The
kidneys are normal in size and position. Two separate 4 mm
nonobstructing calculi are noted within the left kidney, unchanged
from prior examination. Simple cortical cyst noted within the
interpolar region of the left kidney. No hydronephrosis. No ureteral
calculi. No perinephric fluid collections or significant stranding
identified. The bladder is unremarkable.

Stomach/Bowel: Large volume stool is seen within the descending and
rectosigmoid colon, progressive since prior examination. The
stomach, small bowel, and large bowel are otherwise unremarkable.
The appendix is absent. No free intraperitoneal gas or fluid.

Vascular/Lymphatic: No significant vascular findings are present. No
enlarged abdominal or pelvic lymph nodes.

Reproductive: Prostate is unremarkable.

Other: Tiny fat containing umbilical hernia.

Musculoskeletal: Degenerative changes are seen within the lumbar
spine. No acute bone abnormality.
IMPRESSION: Progressive, large volume stool within the descending and
rectosigmoid colon.

Stable mild left nonobstructing nephrolithiasis.

Moderate multi-vessel coronary artery calcification.

## 2021-05-15 MED ORDER — SODIUM CHLORIDE 0.9 % IV BOLUS
1000.0000 mL | Freq: Once | INTRAVENOUS | Status: AC
  Administered 2021-05-15: 1000 mL via INTRAVENOUS

## 2021-05-15 MED ORDER — FLEET ENEMA 7-19 GM/118ML RE ENEM
1.0000 | ENEMA | Freq: Once | RECTAL | Status: DC
  Filled 2021-05-15: qty 1

## 2021-05-15 MED ORDER — PEG 3350-KCL-NA BICARB-NACL 420 G PO SOLR
4000.0000 mL | Freq: Once | ORAL | Status: DC
  Filled 2021-05-15: qty 4000

## 2021-05-15 MED ORDER — POLYETHYLENE GLYCOL 3350 17 GM/SCOOP PO POWD
1.0000 | Freq: Once | ORAL | Status: AC
  Administered 2021-05-15: 255 g via ORAL
  Filled 2021-05-15: qty 255

## 2021-05-15 MED ORDER — IOHEXOL 350 MG/ML SOLN
80.0000 mL | Freq: Once | INTRAVENOUS | Status: AC | PRN
  Administered 2021-05-15: 80 mL via INTRAVENOUS

## 2021-05-15 NOTE — Discharge Instructions (Addendum)
Use the MiraLAX prep as instructed.  Return to the emergency department if you develop worsening pain, high fever, bloody stools, or if symptoms worsen or change.

## 2021-05-15 NOTE — ED Notes (Signed)
PTAR contacted for transportation home.

## 2021-05-15 NOTE — ED Provider Notes (Signed)
[facility] DEPT Provider Note   CSN: [id number] Arrival date & time: 05/14/21  2350     History Chief Complaint  Patient presents with   Abdominal Pain    Tyler Brewer is a 60 y.o. male.  Patient is a 60 year old male with past medical history of chronic inflammatory demyelinating polyneuropathy, diabetes, hypertension, chronic opiate use.  Patient presenting today for evaluation of constipation.  He describes generalized abdominal pain and passing hard stools.  He describes having difficulty having a bowel movement and what stool he has had he has had to "dig it out".  He has had some rectal bleeding secondary to this.  He denies vomiting.  He denies fevers or chills.  The history is provided by the patient.  Abdominal Pain Pain location:  Generalized Pain quality: cramping   Pain radiates to:  Does not radiate Pain severity:  Moderate Timing:  Constant Progression:  Worsening Chronicity:  New     Past Medical History:  Diagnosis Date   Arthritis    CAD (coronary artery disease)  cardiac cath with moderate disease in a septal branch of the ramus intermedius 04/01/2012   CIDP (chronic inflammatory demyelinating polyneuropathy) ([place])    Depression    Diabetes mellitus    poorly controlled by his report   History of narcotic addiction ([place])    past history of back pain   Hypercholesteremia    Hypertension    IBS (irritable bowel syndrome)    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[place])    Neuropathy    Obesity    Max weight was 390   OSA on CPAP    Panic attacks    Testosterone deficiency    Vertigo     Patient Active Problem List   Diagnosis Date Noted   CIDP (chronic inflammatory demyelinating polyneuropathy) ([place]) 01/12/2021   Diabetes mellitus ([place]) 01/03/2021   Type 2 diabetes mellitus with hyperglycemia, with long-term current use of insulin ([place]) 01/03/2021   Lower extremity edema 11/28/2020   Chronic low back pain 11/28/2020    Psoriatic arthritis ([place]) 11/28/2020   Thoracic aortic aneurysm without rupture ([place]) 11/28/2020   Bilateral leg weakness 11/05/2020   Acute left-sided low back pain with right-sided sciatica 07/19/2020   Hip pain, acute, left 07/05/2020   Tremor 07/05/2020   Weakness 07/05/2020   Balance problem 03/11/2020   Tinea cruris 03/11/2020   Cellulitis of left groin 03/11/2020   Acute pain of right shoulder 03/11/2020   Interstitial lung disease ([place]) 05/02/2017   Pansinusitis 09/26/2016   Diabetic polyneuropathy associated with diabetes mellitus due to underlying condition ([place]) 05/08/2016   Methamphetamine use disorder, severe, dependence ([place]) 02/11/2016   Substance induced mood disorder ([place]) 02/11/2016   Exertional dyspnea 11/30/2015   ADD (attention deficit disorder) 09/29/2015   Binge eating 09/29/2015   Syncope 05/05/2012   Diarrhea 05/05/2012   Panic attacks 05/05/2012   OSA (obstructive sleep apnea) 05/05/2012   HTN (hypertension) 05/05/2012   Dyslipidemia 05/05/2012   CAD (coronary artery disease)  cardiac cath with moderate disease in a septal branch of the ramus intermedius 04/01/2012   Chest pain 04/01/2012   Drug abuse and dependence ([place]) 04/01/2012   Family history of coronary artery disease 03/31/2012   Sleep apnea, on C-pap 03/31/2012   Hyperlipemia 03/30/2012   HTN (hypertension), benign 03/30/2012   Morbid obesity ([place]) 03/30/2012   DM type 2, uncontrolled, with neuropathy ([place]) 03/30/2012   Depression with suicidal ideation 03/30/2012    Past Surgical History:  Procedure Laterality Date   CARDIAC CATHETERIZATION     IR FLUORO GUIDE CV LINE RIGHT  11/10/2020   IR US GUIDE VASC ACCESS RIGHT  11/10/2020   LEFT HEART CATHETERIZATION WITH CORONARY ANGIOGRAM N/A 03/31/2012   Procedure: LEFT HEART CATHETERIZATION WITH CORONARY ANGIOGRAM;  Surgeon: Leonie Man, MD;  Location: [facility] CATH LAB;  Service: Cardiovascular;  Laterality: N/A;       Family History   Problem Relation Age of Onset   Diabetes type II Father    Hypertension Father    Pancreatic disease Father 13       Deceased   Healthy Mother    Healthy Sister    Healthy Son    Healthy Daughter    Parkinson's disease Maternal Grandmother     Social History   Tobacco Use   Smoking status: Former    Packs/day: 0.50    Types: Cigarettes    Quit date: 04/27/2021    Years since quitting: 0.0   Smokeless tobacco: Never  Vaping Use   Vaping Use: Never used  Substance Use Topics   Alcohol use: No   Drug use: Not Currently    Types: Methamphetamines    Home Medications Prior to Admission medications   Medication Sig Start Date End Date Taking? Authorizing Provider  acetaminophen (TYLENOL) 500 MG tablet Take 500-1,000 mg by mouth every 6 (six) hours as needed for mild pain.    [provider]  amLODipine (NORVASC) 10 MG tablet Take 1 tablet (10 mg total) by mouth daily. Patient not taking: Reported on 04/25/2021 01/26/21   Carollee Herter, Alferd Apa, DO  COMFORT EZ INSULIN SYRINGE 31G X 5/16" 1 ML MISC  12/15/20   [provider]  Cyanocobalamin (VITAMIN B-12 PO) Take 1 tablet by mouth daily.    [provider]  diclofenac Sodium (VOLTAREN) 1 % GEL Apply 4 g topically 4 (four) times daily as needed (joint pain).    [provider]  furosemide (LASIX) 20 MG tablet TAKE 1 TABLET(20 MG) BY MOUTH DAILY Patient taking differently: Take 40 mg by mouth daily. 12/21/20   [name], DO  gabapentin (NEURONTIN) 300 MG capsule Take 300 mg by mouth 2 (two) times daily. 02/16/21   [provider]  hydrALAZINE (APRESOLINE) 50 MG tablet Take 1 tablet (50 mg total) by mouth every 8 (eight) hours. 04/20/21   [name], DO  ibuprofen (ADVIL) 200 MG tablet Take 400 mg by mouth every 6 (six) hours as needed for mild pain.    [provider]  insulin NPH Human (NOVOLIN N) 100 UNIT/ML injection Inject 35 Units into the skin at bedtime.     [provider]  Insulin Pen Needle 31G X 5 MM MISC 1 Device by Does not apply route in the morning, at noon, in the evening, and at bedtime. 01/03/21   [name], Melanie Crazier, MD  Insulin Regular Human (HUMULIN R) 100 UNIT/ML KwikPen Inject 14 Units into the skin 3 (three) times daily before meals. Uses [employer] Relion R insulin    [provider]  losartan (COZAAR) 100 MG tablet TAKE ONE TABLET BY MOUTH DAILY Patient taking differently: Take 100 mg by mouth daily. 04/18/21   [name] R, DO  MAGNESIUM PO Take 1 tablet by mouth daily.    [provider]  meclizine (ANTIVERT) 25 MG tablet Take 1 tablet (25 mg total) by mouth 3 (three) times daily as needed for dizziness. 02/18/21  Molpus, John, MD  meloxicam (MOBIC) 15 MG tablet Take 1 tablet (15 mg total) by mouth daily. 03/14/21   Kirsteins, Luanna Salk, MD  Menthol, Topical Analgesic, (ICY HOT EX) Apply 1 application topically as needed (pain).    [provider]  metFORMIN (GLUCOPHAGE-XR) 500 MG 24 hr tablet Take 2 tablets (1,000 mg total) by mouth in the morning and at bedtime. 01/26/21   [name], DO  methocarbamol (ROBAXIN) 500 MG tablet Take 500 mg by mouth 4 (four) times daily as needed for muscle spasms.    [provider]  Multiple Vitamin (MULTIVITAMIN) tablet Take 1 tablet by mouth daily.    [provider]  pravastatin (PRAVACHOL) 40 MG tablet Take 1 tablet (40 mg total) by mouth daily. 01/26/21   [name], DO  pregabalin (LYRICA) 75 MG capsule Take 1 capsule (75 mg total) by mouth 2 (two) times daily. 03/14/21   Kirsteins, Luanna Salk, MD  tamsulosin (FLOMAX) 0.4 MG CAPS capsule TAKE ONE CAPSULE BY MOUTH ONE TIME DAILY Patient taking differently: Take 0.4 mg by mouth daily. 04/18/21   [name], DO  traMADol (ULTRAM) 50 MG tablet 2 po q6 h prn Patient taking differently: Take 100 mg by mouth every 6 (six) hours as needed for moderate pain.  02/03/21   [name], DO    Allergies    Patient has no known allergies.  Review of Systems   Review of Systems  Gastrointestinal:  Positive for abdominal pain.  All other systems reviewed and are negative.  Physical Exam Updated Vital Signs BP 127/83 (BP Location: Left Arm)   Pulse 93   Temp 98 F (36.7 C) (Oral)   Resp (!) 22   Ht 5\' 11"  (1.803 m)   Wt 131.5 kg   SpO2 99%   BMI 40.45 kg/m   Physical Exam Vitals and nursing note reviewed.  Constitutional:      General: He is not in acute distress.    Appearance: He is well-developed. He is not diaphoretic.  HENT:     Head: Normocephalic and atraumatic.  Cardiovascular:     Rate and Rhythm: Normal rate and regular rhythm.     Heart sounds: No murmur heard.   No friction rub.  Pulmonary:     Effort: Pulmonary effort is normal. No respiratory distress.     Breath sounds: Normal breath sounds. No wheezing or rales.  Abdominal:     General: Bowel sounds are normal. There is no distension.     Palpations: Abdomen is soft.     Tenderness: There is no abdominal tenderness.  Genitourinary:    Comments: Rectal examination limited secondary to pain and patient intolerance.  There are no obvious inflamed hemorrhoids or fissures. Musculoskeletal:        General: Normal range of motion.     Cervical back: Normal range of motion and neck supple.  Skin:    General: Skin is warm and dry.  Neurological:     Mental Status: He is alert and oriented to person, place, and time.     Coordination: Coordination normal.    ED Results / Procedures / Treatments   Labs (all labs ordered are listed, but only abnormal results are displayed) Labs Reviewed  COMPREHENSIVE METABOLIC PANEL  CBC WITH DIFFERENTIAL/PLATELET  LIPASE, BLOOD    EKG None  Radiology No results found.  Procedures Procedures   Medications Ordered in ED Medications  sodium chloride 0.9 %  bolus 1,000 mL (has no administration in time range)     ED Course  I have reviewed the triage vital signs and the nursing notes.  Pertinent labs & imaging results that were available during my care of the patient were reviewed by me and considered in my medical decision making (see chart for details).    MDM Rules/Calculators/A&P  Patient is a 60 year old male presenting with constipation and abdominal cramping.  Patient with poor tolerance of digital rectal exam and CT scan was obtained.  This showed increased stool, but no evidence for obstruction or other acute process.  Patient unable to tolerate attempt at enema administration.  Patient will be discharged with a MiraLAX prep and follow-up as needed.  Final Clinical Impression(s) / ED Diagnoses Final diagnoses:  None    Rx / DC Orders ED Discharge Orders     None        Veryl Speak, MD 05/15/21 0403

## 2021-05-16 ENCOUNTER — Ambulatory Visit: Payer: Medicare HMO | Admitting: *Deleted

## 2021-05-16 DIAGNOSIS — IMO0002 Reserved for concepts with insufficient information to code with codable children: Secondary | ICD-10-CM

## 2021-05-16 DIAGNOSIS — M545 Low back pain, unspecified: Secondary | ICD-10-CM

## 2021-05-16 DIAGNOSIS — R0609 Other forms of dyspnea: Secondary | ICD-10-CM

## 2021-05-16 DIAGNOSIS — G8929 Other chronic pain: Secondary | ICD-10-CM

## 2021-05-16 DIAGNOSIS — E1140 Type 2 diabetes mellitus with diabetic neuropathy, unspecified: Secondary | ICD-10-CM

## 2021-05-16 DIAGNOSIS — E785 Hyperlipidemia, unspecified: Secondary | ICD-10-CM

## 2021-05-16 DIAGNOSIS — R2689 Other abnormalities of gait and mobility: Secondary | ICD-10-CM

## 2021-05-16 DIAGNOSIS — I10 Essential (primary) hypertension: Secondary | ICD-10-CM

## 2021-05-16 DIAGNOSIS — R0600 Dyspnea, unspecified: Secondary | ICD-10-CM

## 2021-05-16 DIAGNOSIS — S8255XA Nondisplaced fracture of medial malleolus of left tibia, initial encounter for closed fracture: Secondary | ICD-10-CM

## 2021-05-16 DIAGNOSIS — R55 Syncope and collapse: Secondary | ICD-10-CM

## 2021-05-16 NOTE — Patient Instructions (Signed)
Visit Information  PATIENT GOALS:  Goals Addressed               This Visit's Progress     Receive Long-Term Placement in an [place]. (pt-stated)   On track     Timeframe:  Short-Range Goal Priority:  High Start Date:  03/30/2021                        Expected End Date: 05/30/2021  Follow-Up Plan:  05/22/2021 at 10:30am  Patient Goals/Self-Care Activities: Collaboration with Deirdre Pippins, Admissions Coordinator at [facility] and [facility] [phone]), facility of choice, to check bed availability and to pursue placement options. Keep appointment with representative from the [facility] to obtain assistance with completion of Adult Medicaid application, scheduled on 05/17/2021 at 9:00am. Please contact LCSW directly (# [phone]) if you have questions, need assistance, or if additional social work needs are identified between now and our next scheduled telephone outreach call.        Patient verbalizes understanding of instructions provided today and agrees to view in [place].   Telephone follow up appointment with care management team member scheduled for:  05/22/2021 at 10:30am  [place] Licensed Clinical Social Worker [place] [phone]

## 2021-05-16 NOTE — Chronic Care Management (AMB) (Signed)
Chronic Care Management    Clinical Social Work Note  05/16/2021 Name: Tyler Brewer MRN: [id number] DOB: February 01, 1961  Tyler Brewer is a 60 y.o. year old male who is a primary care patient of [name], DO. The CCM team was consulted to assist the patient with chronic disease management and/or care coordination needs related to: Level of Care Concerns.   Engaged with patient by telephone for follow up visit in response to provider referral for social work chronic care management and care coordination services.   Consent to Services:  The patient was given information about Chronic Care Management services, agreed to services, and gave verbal consent prior to initiation of services.  Please see initial visit note for detailed documentation.   Patient agreed to services and consent obtained.   Assessment: Review of patient past medical history, allergies, medications, and health status, including review of relevant consultants reports was performed today as part of a comprehensive evaluation and provision of chronic care management and care coordination services.     SDOH (Social Determinants of Health) assessments and interventions performed:    Advanced Directives Status: Not addressed in this encounter.  CCM Care Plan  No Known Allergies  Outpatient Encounter Medications as of 05/16/2021  Medication Sig Note   acetaminophen (TYLENOL) 500 MG tablet Take 500-1,000 mg by mouth every 6 (six) hours as needed for mild pain.    amLODipine (NORVASC) 10 MG tablet Take 1 tablet (10 mg total) by mouth daily.    COMFORT EZ INSULIN SYRINGE 31G X 5/16" 1 ML MISC     Cyanocobalamin (VITAMIN B-12 PO) Take 1 tablet by mouth daily.    diclofenac Sodium (VOLTAREN) 1 % GEL Apply 4 g topically 4 (four) times daily as needed (joint pain).    DULoxetine (CYMBALTA) 60 MG capsule Take 1 capsule by mouth daily.    furosemide (LASIX) 20 MG tablet TAKE 1 TABLET(20 MG) BY MOUTH DAILY (Patient  taking differently: Take 40 mg by mouth daily.)    gabapentin (NEURONTIN) 300 MG capsule Take 300 mg by mouth 2 (two) times daily. 04/19/2021: Per patient he is tapering off gabapentin and will only take pregabalin    hydrALAZINE (APRESOLINE) 50 MG tablet Take 1 tablet (50 mg total) by mouth every 8 (eight) hours.    ibuprofen (ADVIL) 200 MG tablet Take 400 mg by mouth every 6 (six) hours as needed for mild pain.    insulin NPH Human (NOVOLIN N) 100 UNIT/ML injection Inject 35 Units into the skin at bedtime.    Insulin Pen Needle 31G X 5 MM MISC 1 Device by Does not apply route in the morning, at noon, in the evening, and at bedtime.    Insulin Regular Human (HUMULIN R) 100 UNIT/ML KwikPen Inject 14 Units into the skin 3 (three) times daily before meals. Uses [employer] Relion R insulin (Patient not taking: No sig reported)    losartan (COZAAR) 100 MG tablet TAKE ONE TABLET BY MOUTH DAILY (Patient taking differently: Take 100 mg by mouth daily.)    MAGNESIUM PO Take 1 tablet by mouth daily. (Patient not taking: No sig reported)    meclizine (ANTIVERT) 25 MG tablet Take 1 tablet (25 mg total) by mouth 3 (three) times daily as needed for dizziness. (Patient not taking: No sig reported)    meloxicam (MOBIC) 15 MG tablet Take 1 tablet (15 mg total) by mouth daily. (Patient not taking: No sig reported)    Menthol, Topical Analgesic, (ICY HOT EX) Apply  1 application topically as needed (pain).    metFORMIN (GLUCOPHAGE-XR) 500 MG 24 hr tablet Take 2 tablets (1,000 mg total) by mouth in the morning and at bedtime.    methocarbamol (ROBAXIN) 500 MG tablet Take 500 mg by mouth 4 (four) times daily as needed for muscle spasms. (Patient not taking: No sig reported)    Multiple Vitamin (MULTIVITAMIN) tablet Take 1 tablet by mouth daily.    pravastatin (PRAVACHOL) 40 MG tablet Take 1 tablet (40 mg total) by mouth daily.    pregabalin (LYRICA) 75 MG capsule Take 1 capsule (75 mg total) by mouth 2 (two) times daily.  (Patient not taking: No sig reported)    tamsulosin (FLOMAX) 0.4 MG CAPS capsule TAKE ONE CAPSULE BY MOUTH ONE TIME DAILY (Patient taking differently: Take 0.4 mg by mouth daily after breakfast.)    traMADol (ULTRAM) 50 MG tablet 2 po q6 h prn (Patient taking differently: Take 100 mg by mouth every 6 (six) hours as needed for moderate pain.)    No facility-administered encounter medications on file as of 05/16/2021.    Patient Active Problem List   Diagnosis Date Noted   CIDP (chronic inflammatory demyelinating polyneuropathy) ([place]) 01/12/2021   Diabetes mellitus ([place]) 01/03/2021   Type 2 diabetes mellitus with hyperglycemia, with long-term current use of insulin ([place]) 01/03/2021   Lower extremity edema 11/28/2020   Chronic low back pain 11/28/2020   Psoriatic arthritis ([place]) 11/28/2020   Thoracic aortic aneurysm without rupture ([place]) 11/28/2020   Bilateral leg weakness 11/05/2020   Acute left-sided low back pain with right-sided sciatica 07/19/2020   Hip pain, acute, left 07/05/2020   Tremor 07/05/2020   Weakness 07/05/2020   Balance problem 03/11/2020   Tinea cruris 03/11/2020   Cellulitis of left groin 03/11/2020   Acute pain of right shoulder 03/11/2020   Interstitial lung disease ([place]) 05/02/2017   Pansinusitis 09/26/2016   Diabetic polyneuropathy associated with diabetes mellitus due to underlying condition ([place]) 05/08/2016   Methamphetamine use disorder, severe, dependence ([place]) 02/11/2016   Substance induced mood disorder ([place]) 02/11/2016   Exertional dyspnea 11/30/2015   ADD (attention deficit disorder) 09/29/2015   Binge eating 09/29/2015   Syncope 05/05/2012   Diarrhea 05/05/2012   Panic attacks 05/05/2012   OSA (obstructive sleep apnea) 05/05/2012   HTN (hypertension) 05/05/2012   Dyslipidemia 05/05/2012   CAD (coronary artery disease)  cardiac cath with moderate disease in a septal branch of the ramus intermedius 04/01/2012   Chest pain 04/01/2012   Drug abuse and  dependence ([place]) 04/01/2012   Family history of coronary artery disease 03/31/2012   Sleep apnea, on C-pap 03/31/2012   Hyperlipemia 03/30/2012   HTN (hypertension), benign 03/30/2012   Morbid obesity ([place]) 03/30/2012   DM type 2, uncontrolled, with neuropathy ([place]) 03/30/2012   Depression with suicidal ideation 03/30/2012    Conditions to be addressed/monitored: CAD and HTN.  Level of Care Concerns, ADL/IADL Limitations, Limited Access to Caregiver, and Lacks Knowledge of [employer].  Care Plan : LCSW Plan of Care  Updates made by Francis Gaines, LCSW since 05/16/2021 12:00 AM     Problem: Receive Long-Term Placement in an [place].   Priority: High     Goal: Receive Long-Term Placement in an [place].   Start Date: 03/30/2021  Expected End Date: 05/30/2021  This Visit's Progress: On track  Recent Progress: On track  Priority: High  Note:   Current Barriers:   Patient with History of Falls/Fall Risk,  Unsteady Gait, Impaired Mobility, Weakness, Tremor, Syncope, Balance Problem and Lower Extremity Edema needs assistance with initiating short-term rehabilitative services in a skilled nursing facility or long-term care in an assisted living facility. Clinical Goal(s):  Patient will receive assistance with initiating placement, whether it be assisted or skilled level care.   Interventions: Collaboration with Primary Care Physician, Dr. [name] regarding development and update of comprehensive plan of care as evidenced by provider attestation and co-signature. Collaboration with Primary Care Physician, Dr. [name] to request completed and signed updated FL-2 Form. Patient Goals/Self-Care Activities: Collaboration with Deirdre Pippins, Admissions Coordinator at [facility] [phone]), facility of choice, to check bed availability and to pursue placement options. Keep appointment with  representative from the [facility] to obtain assistance with completion of Adult Medicaid application, scheduled on 05/17/2021 at 9:00am. Please contact LCSW directly (# [phone]) if you have questions, need assistance, or if additional social work needs are identified between now and our next scheduled telephone outreach call. Follow-Up:  05/22/2021 at 10:30am   [place] Licensed Clinical Social Worker [place] [phone]

## 2021-05-17 ENCOUNTER — Other Ambulatory Visit: Payer: Self-pay

## 2021-05-17 ENCOUNTER — Ambulatory Visit: Payer: Medicare HMO | Admitting: Pharmacist

## 2021-05-17 DIAGNOSIS — M545 Low back pain, unspecified: Secondary | ICD-10-CM

## 2021-05-17 DIAGNOSIS — E785 Hyperlipidemia, unspecified: Secondary | ICD-10-CM

## 2021-05-17 DIAGNOSIS — I10 Essential (primary) hypertension: Secondary | ICD-10-CM

## 2021-05-17 DIAGNOSIS — F410 Panic disorder [episodic paroxysmal anxiety] without agoraphobia: Secondary | ICD-10-CM

## 2021-05-17 DIAGNOSIS — G8929 Other chronic pain: Secondary | ICD-10-CM

## 2021-05-17 DIAGNOSIS — E1165 Type 2 diabetes mellitus with hyperglycemia: Secondary | ICD-10-CM

## 2021-05-17 NOTE — Patient Instructions (Signed)
Visit Information  PATIENT GOALS:  Goals Addressed             This Visit's Progress    Chronic Care Management Pharmacy Care Plan       Current Barriers:  Unable to independently afford treatment regimen - improving Unable to independently monitor therapeutic efficacy - improving Unable to achieve control of type 2 diabetes and high blood pressure / hypertension - improving Does not adhere to prescribed medication regimen - improving Frequent ED visits   Pharmacist Clinical Goal(s):  Over the next 45 days, patient will verbalize ability to afford treatment regimen achieve adherence to monitoring guidelines and medication adherence to achieve therapeutic efficacy achieve control of type 2 diabetes and high blood pressure / hypertension as evidenced by attainment of goals listed below Decrease frequency of ED visits through collaboration with PharmD and provider.   Interventions: 1:1 collaboration with Carollee Herter, Alferd Apa, DO regarding development and update of comprehensive plan of care as evidenced by provider attestation and co-signature Inter-disciplinary care team collaboration (see longitudinal plan of care) Comprehensive medication review performed; medication list updated in electronic medical record    Diabetes: Lab Results  Component Value Date   HGBA1C 7.7 (H) 02/14/2021  A1c 03/23/2021 at [facility] was 6.9%  Controlled - (goal A1c <7%) Much improved BG since started using Libre and taking insuiln regularly States cost of insulin still difficult but he cannot find application I sent for patient assistnace.  Current treatment: Relion NPH insulin- inject 35 units at bedtime Relion Regular insulin - inject 14 units prior to each meal.   Metformin XR 500mg  - 2 Tablets with Breakfast and 2 tablets with Supper (last refilled 04/20/2021 for 90 DS)  Interventions / Recommendations Recommended continue to take medications as directed by Dr [name] Continue to check  BG frequently during day with [facility]  Hypertension: BP Readings from Last 3 Encounters:  05/15/21 100/84  05/13/21 131/87  05/11/21 (!) 153/89  Blood pressure not consistently at goal - BP goal <140/90 Current treatment: Amlodipine 10mg  daily  Hydralazine 50mg  every 8 hours Losartan 100mg  daily  Interventions: (addressed at previous visits)  Counseled on importance of getting blood pressure to goal to prevent strokes and kidney damage Reviewed refill history.  Called [facility] and verified that amlodipine has been returned to stock. Patient has request to have filled at either [facility] or [facility] order.   Hyperlipidemia / Heart Disease: Lipid Panel     Component Value Date/Time   CHOL 118 11/28/2020 1502   TRIG 130.0 11/28/2020 1502   HDL 40.00 11/28/2020 1502   CHOLHDL 3 11/28/2020 1502   VLDL 26.0 11/28/2020 1502   LDLCALC 52 11/28/2020 1502  Controlled - LDL goal <70 Current treatment: Pravastatin 40mg  at bedtime Interventions:  Counseled on LDL goals Recommended continue current regimen for cholesterol  Chronic Pain / Leg Weakness: Currently not controlled Patient has had several ED visits for pain and weakness in the last month. Has appointment with pain management 02/16/2021. Current treatment:  Tramadol 50mg  - take 1 or 2 tablets up to every 8 hours as needed Methocarbamol 500mg  - take 1 tablet 4 times a day as needed for muscle spasms Voltaren Gel 1% -  apply to area of pain up to 4 times a day as needed Gabapentin 300mg  - take 4 tablets = 1200mg  3 times a day (increased during recent hospitalization) Duloxetine 60mg  at night Interventions:  Continue current regimen for pain  Updated medication list to remove  pregabalin - stopped during hospitalization Encouraged patient to follow up with neurology and pain management Recommended change duloxetine to take in morning to see change helps with sleep  Medication Management:  Reviewed refill history.  Only see that he is due to refill amlodipine  Interventions:   Will continue to follow adherence and assist patient with refills.  Reminded patient that he needs to answer calls for regular checki-ns so that we can order / request refills BEFORE he runs out of medication.     Patient Goals/Self-Care Activities Over the next 90 days, patient will:  take medications as prescribed focus on medication adherence by utilizing pharmacy service that delivers medications to his home, check glucose before meals and 2 hour after meals using CGM, document, and provide at future appointments collaborate with provider on medication access solutions   Follow up call planned in 3 to 4 weeks         Patient verbalizes understanding of instructions provided today and agrees to view in [place].   Telephone follow up appointment with care management team member scheduled for: 3 to 4 weeks  Cherre Robins, PharmD Clinical Pharmacist [facility]

## 2021-05-17 NOTE — Chronic Care Management (AMB) (Signed)
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 Will continue to get IVIG every 3 weeks as outpatient  Patient reports having difficulty sleeping and increased anxiety over the last 2 weeks. He will see PCP tomorrow for difficulty urinating and back pain.  According to hospital discharge records patient was to stop hydralazine however he has not stopped. Patient is not checking blood pressure at home. Denies dizziness.   Plan:  Recommended patient take duloxetine 44m in the morning instead of night as duloxetine has been associated with difficulty sleeping.  Reviewed past therapies patient has tried and will send to PCP for reference for tomorrows visit.  Evaluate blood pressure and need to continue hydralazine when in office tomorrow.    Subjective: TSavien Mamulais an 60y.o. year old male who is a primary patient of LAnn Held DO.  The CCM team was consulted for assistance with disease management and care coordination needs.    Engaged with patient by telephone for  follow up   in response to provider referral for pharmacy case management and/or care coordination services.   Consent to Services:  The patient was given information about Chronic Care Management services, agreed to services, and gave verbal consent prior to initiation of services.  Please see initial visit note for detailed documentation.   Patient Care Team: LCarollee Herter YAlferd Apa DO as PCP - General (Family Medicine) PAlda Berthold DO as Consulting Physician (Neurology) LLajuana Matte MD as Consulting Physician (Cardiothoracic Surgery) BMarlaine Hind MD as Consulting Physician (Physical Medicine and Rehabilitation) WLuretha Rued RN as Case Manager ECherre Robins PharmD (Pharmacist) [facility] IMelanie Crazier MD as Consulting Physician  (Endocrinology) Saporito, JMaree Erie LCSW as Social Worker (Licensed Clinical Social Worker)  Recent office visits: 01/12/2021 - PCP (Dr LEtter Sjogren F/U visit; recommended COVID vaccine and colonoscopy; no medication changes.  11/28/2020 - PCP ([facility] f[facility]u; c/o LEE; Refilled furosemide 226mtake 1 tablet daily and tramadol 5013m or 2 tablet every 8 hours. Referred to Endo for DM.   Recent consult visits: 03/27/2021 - [place] ([place]08/03/2021 - Neurology - (Dr PatPosey Prontoolyrediculoneuropathy. No medication changes.   03/24/2021 - [facility] lower [facility] weakness. Repeat EMG/NCS to assess for diabetic amyotrophy versus demyelinating process. Labs checked. No med changes. F/U 3 months.   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 No medication changes. Recommended optimize control of DM and physical therapy to improve strength.   01/03/2021 - Endo (Dr ShaKelton Pillarnitial consult for insulin dependent type 2 DM. Started Metformin 1 tablet daily with Breakfast for 1 week, then increase to 1 tablet with Breakfast and 1 tablet with Supper for  1 week, then increase to 2 tablets with Breakfast  and 1 tablet with Supper for another 1 week , then finally 2 Tablets with Breakfast and 2 tablets with Supper.  Increased Tresiba to 45 units daily. Increase Novolog to 12 units with each meal     Hospital visits: 05/14/2021 - ED Visit - Seen for constipation. Recommended Miralax as needed.   05/12/2021 ED Visit - Seen for lumbar strain and contusion of hip and thigh.  05/11/2021 - ED Visit - fall with fracture of medial malleolus of left tibia and of proximal phalanx of left great toe.  04/26/2021 to  05/05/2021 hospital admission at [place] for worsening weakness and falls. Received 5 days of IVIG treatment with gradual improvement in lower extremity weakness.  Medications started  at discharge:  IVIG every 2 weeks, duloxetine 104m daily Medications changed at discharge:  gabapentin 3081m- take 4 caps = 120033m times a day; Novolin N - 20 units daily at bedtime; Novolin R 7 units 3 times a day before meals. Losartan 100m27mily. Tramadol 50g every 6 hours as needed for pain for up to 14 days.  Medications stopped at discharge:  ciprofloxacin, hydralazine, hydrocodone/APAP, Novolog Mix 70/30; nicotine 21mg8mches; nystatin powder, pregabalin 75mg,31mtra DS; TresibTyler [facility]as1[facility] Performed scrotal U/S to r/o epididymitis or other pathology. Only showed small bilateral hydroceles. No med changes.  [facility] Noted also to have urinary retention. MRI showed no changes or spinal cord compression. GIven tramadol, lorazepam and meloxicam in hospital. Patient was noted to be upset at dicharge because he wanted to be admitted. 03/16/2021 - ED Visit - for neuropathy - no med changes.  01/23/2021 - ED Visit - acute pain in left knee given oxycodone / APAP for 1 dose and IM toradol 01/21/2021 - Ed Visit - [facility] without being seen [facility] 01/20/2021 - ED Visit - abdominal pain; GIven GI cocktail with improved symptoms. No change to medication regimen 01/16/2021 - ED Visit - leg pain; Given ketorolac 30mg I54mxycodone 10mg x 50mse; prednisone 60mg x 147me and Rx to start methylprednisolone dose pack   Objective:  Lab Results  Component Value Date   CREATININE 1.05 05/15/2021   CREATININE 0.70 04/20/2021   CREATININE 0.79 04/12/2021    Lab Results  Component Value Date   HGBA1C 7.7 (H) 02/14/2021   Last diabetic Eye exam: No results found for: HMDIABEYEEXA  Last diabetic Foot exam: No results found for: HMDIABFOOTEX      Component Value Date/Time   CHOL 136 02/14/2021 1419   TRIG 102.0 02/14/2021 1419   HDL 55.50 02/14/2021 1419    CHOLHDL 2 02/14/2021 1419   VLDL 20.4 02/14/2021 1419   LDLCALC 60 02/14/2021 1419   LDLDIRECT 124.0 05/02/2017 1507    Hepatic Function Latest Ref Rng & Units 05/15/2021 04/20/2021 04/12/2021  Total Protein 6.5 - 8.1 g/dL 8.1 6.6 7.0  Albumin 3.5 - 5.0 g/dL 3.7 3.1(L) 3.8  AST 15 - 41 U/L 18 28 13(L)  ALT 0 - 44 U/L '26 23 20  ' Alk Phosphatase 38 - 126 U/L 66 78 74  Total Bilirubin 0.3 - 1.2 mg/dL 0.6 1.1 0.3    Lab Results  Component Value Date/Time   TSH 2.57 02/14/2021 02:19 PM   TSH 2.948 11/05/2020 07:18 AM   TSH 2.66 03/10/2020 10:06 AM    CBC Latest Ref Rng & Units 05/15/2021 04/20/2021 04/12/2021  WBC 4.0 - 10.5 K/uL 6.6 5.8 6.3  Hemoglobin 13.0 - 17.0 g/dL 13.7 12.1(L) 13.9  Hematocrit 39.0 - 52.0 % 41.3 36.4(L) 40.6  Platelets 150 - 400 K/uL 268 300 276    No results found for: VD25OH  Clinical ASCVD: Yes  The 10-year ASCVD risk score (Arnett DK, et al., 2019) is: 6.6%   Values used to calculate the score:     Age: 69 years 54  Sex: Male     Is Non-Hispanic African American: No  Diabetic: Yes     Tobacco smoker: No     Systolic Blood Pressure: 536 mmHg     Is BP treated: Yes     HDL Cholesterol: 55.5 mg/dL     Total Cholesterol: 136 mg/dL     Social History   Tobacco Use  Smoking Status Former   Packs/day: 0.50   Types: Cigarettes   Quit date: 04/27/2021   Years since quitting: 0.0  Smokeless Tobacco Never   BP Readings from Last 3 Encounters:  05/15/21 100/84  05/13/21 131/87  05/11/21 (!) 153/89   Pulse Readings from Last 3 Encounters:  05/15/21 91  05/13/21 87  05/11/21 84   Wt Readings from Last 3 Encounters:  05/15/21 290 lb (131.5 kg)  05/12/21 290 lb (131.5 kg)  05/11/21 290 lb (131.5 kg)    Assessment: Review of patient past medical history, allergies, medications, health status, including review of consultants reports, laboratory and other test data, was performed as part of comprehensive evaluation and provision of chronic care  management services.   SDOH:  (Social Determinants of Health) assessments and interventions performed:    CCM Care Plan  No Known Allergies  Medications Reviewed Today     Reviewed by Cherre Robins, PharmD (Pharmacist) on 05/17/21 at 1644  Med List Status: <None>   Medication Order Taking? Sig Documenting Provider Last Dose Status Informant  acetaminophen (TYLENOL) 500 MG tablet [id number] Yes Take 500-1,000 mg by mouth every 6 (six) hours as needed for mild pain. [provider] Taking Active Self  amLODipine (NORVASC) 10 MG tablet [id number] Yes Take 1 tablet (10 mg total) by mouth daily. [name], DO Taking Active Self  COMFORT EZ INSULIN SYRINGE 31G X 5/16" 1 ML [place] [id number] Yes  [provider] Taking Active Self  Cyanocobalamin (VITAMIN B-12 PO) [id number] Yes Take 1 tablet by mouth daily. [provider] Taking Active Self  diclofenac Sodium (VOLTAREN) 1 % GEL [id number] Yes Apply 4 g topically 4 (four) times daily as needed (joint pain). [provider] Taking Active Self  DULoxetine (CYMBALTA) 60 MG capsule [id number] Yes Take 1 capsule by mouth daily. [provider] Taking Active Self  furosemide (LASIX) 20 MG tablet [id number] Yes TAKE 1 TABLET(20 MG) BY MOUTH DAILY  Patient taking differently: Take 40 mg by mouth daily.   [name], DO Taking Active            Med Note Thayer Ohm, RACHEL J   Thu Apr 20, 2021  2:55 PM)    gabapentin (NEURONTIN) 300 MG capsule [id number] Yes Take 1,200 mg by mouth 3 (three) times daily. [provider] Taking Active   hydrALAZINE (APRESOLINE) 50 MG tablet [id number] Yes Take 1 tablet (50 mg total) by mouth every 8 (eight) hours. [name], DO Taking Active Self  ibuprofen (ADVIL) 200 MG tablet [id number]  Take 400 mg by mouth every 6 (six) hours as needed for mild pain. [provider]  Active Self  Immune Globulin 10% (PRIVIGEN) 5 GM/50ML SOLN [id number] Yes  Inject into the vein. Every 3 weeks [provider] Taking Active   insulin NPH Human (NOVOLIN N) 100 UNIT/ML injection [id number] Yes Inject 35 Units into the skin at bedtime. [provider] Taking Active Self  Insulin Pen Needle 31G X 5 MM MISC [id number] Yes 1 Device by Does not apply route in the morning, at noon, in the evening, and at bedtime. [name], Melanie Crazier, MD Taking Active Self  Insulin Regular Human (HUMULIN R) 100 UNIT/ML KwikPen [id number] Yes Inject 14 Units into the skin 3 (three) times daily before meals. Uses [employer] Relion R insulin [provider] Taking Active   losartan (COZAAR) 100 MG tablet [id number] Yes TAKE ONE TABLET BY MOUTH DAILY [name], DO Taking Active   MAGNESIUM PO [id number]  Take 1 tablet by mouth daily.  Patient not taking: No sig reported   [provider]  Active Self  meclizine (ANTIVERT) 25 MG tablet [id number] Yes Take 1 tablet (25 mg total) by mouth 3 (three) times daily as needed for dizziness. Molpus, John, MD Taking Active            Med Note Thayer Ohm, Debria Garret Apr 20, 2021  2:54 PM)    meloxicam (MOBIC) 15 MG tablet [id number]  Take 1 tablet (15 mg total) by mouth daily.  Patient not taking: No sig reported   Kirsteins, Luanna Salk, MD  Active Self  Menthol, Topical Analgesic, (ICY HOT EX) [id number] Yes Apply 1 application topically as needed (pain). [provider] Taking Active Self  metFORMIN (GLUCOPHAGE-XR) 500 MG 24 hr tablet [id number] Yes Take 2 tablets (1,000 mg total) by mouth in the morning and at bedtime. [name], DO Taking Active Self  methocarbamol (ROBAXIN) 500 MG tablet [id number]  Take 500 mg by mouth 4 (four) times daily as needed for muscle spasms.  Patient not taking: No sig reported   [provider]  Active Self  Multiple Vitamin (MULTIVITAMIN) tablet [id number] Yes Take 1 tablet by mouth daily. [provider] Taking Active Self   pravastatin (PRAVACHOL) 40 MG tablet [id number] Yes Take 1 tablet (40 mg total) by mouth daily. [name] R, DO Taking Active Self  tamsulosin (FLOMAX) 0.4 MG CAPS capsule [id number] Yes TAKE ONE CAPSULE BY MOUTH ONE TIME DAILY [name], DO Taking Active   traMADol (ULTRAM) 50 MG tablet [id number]  2 po q6 h prn  Patient taking differently: Take 100 mg by mouth every 6 (six) hours as needed for moderate pain.   [name], DO  Active            Med Note Thayer Ohm, Debria Garret Apr 20, 2021  2:53 PM)              Patient Active Problem List   Diagnosis Date Noted   CIDP (chronic inflammatory demyelinating polyneuropathy) ([place]) 01/12/2021   Diabetes mellitus ([place]) 01/03/2021   Type 2 diabetes mellitus with hyperglycemia, with long-term current use of insulin ([place]) 01/03/2021   Lower extremity edema 11/28/2020   Chronic low back pain 11/28/2020   Psoriatic arthritis ([place]) 11/28/2020   Thoracic aortic aneurysm without rupture ([place]) 11/28/2020   Bilateral leg weakness 11/05/2020   Acute left-sided low back pain with right-sided sciatica 07/19/2020   Hip pain, acute, left 07/05/2020   Tremor 07/05/2020   Weakness 07/05/2020   Balance problem 03/11/2020   Tinea cruris 03/11/2020   Cellulitis of left groin 03/11/2020   Acute pain of right shoulder 03/11/2020   Interstitial lung disease ([place]) 05/02/2017   Pansinusitis 09/26/2016   Diabetic polyneuropathy associated with diabetes mellitus due to underlying condition ([place]) 05/08/2016   Methamphetamine use disorder, severe, dependence ([place]) 02/11/2016   Substance induced mood disorder ([place]) 02/11/2016   Exertional dyspnea 11/30/2015   ADD (attention deficit disorder) 09/29/2015   Binge eating 09/29/2015   Syncope 05/05/2012   Diarrhea  05/05/2012   Panic attacks 05/05/2012   OSA (obstructive sleep apnea) 05/05/2012   HTN (hypertension) 05/05/2012   Dyslipidemia 05/05/2012   CAD (coronary artery disease)   cardiac cath with moderate disease in a septal branch of the ramus intermedius 04/01/2012   Chest pain 04/01/2012   Drug abuse and dependence ([place]) 04/01/2012   Family history of coronary artery disease 03/31/2012   Sleep apnea, on C-pap 03/31/2012   Hyperlipemia 03/30/2012   HTN (hypertension), benign 03/30/2012   Morbid obesity ([place]) 03/30/2012   DM type 2, uncontrolled, with neuropathy ([place]) 03/30/2012   Depression with suicidal ideation 03/30/2012    Immunization History  Administered Date(s) Administered   Tdap 08/20/2014    Conditions to be addressed/monitored: CAD, HTN, HLD, DMII and chronic pain (low back) interstitial lung disease; OSA  Care Plan : General Pharmacy (Adult)  Updates made by Cherre Robins, PHARMD since 05/17/2021 12:00 AM     Problem: Medication management and Chronic Care Management, support and education for the following conditions - HTN, Type2 DM with long term insuiln use; hyperlipidemia; neuropathy; OSA; institial lung disease;   Priority: High  Onset Date: 01/06/2021  Note:   Current Barriers:  Difficulty with affording treatment regimen Unable to independently monitor therapeutic efficacy Unable to achieve control of type 2 DM and HTN - improved Does not adhere to prescribed medication regimen Frequent ED visits  Pharmacist Clinical Goal(s):  Over the next 45 days, patient will verbalize ability to afford treatment regimen achieve adherence to monitoring guidelines and medication adherence to achieve therapeutic efficacy achieve control of type 2 DM and HTN as evidenced by attainment of goals listed below Decrease frequency of ED visits  through collaboration with PharmD and provider.   Interventions: 1:1 collaboration with Carollee Herter, Alferd Apa, DO regarding development and update of comprehensive plan of care as evidenced by provider attestation and co-signature Inter-disciplinary care team collaboration (see longitudinal plan of  care) Comprehensive medication review performed; medication list updated in electronic medical record   Interventions: 1:1 collaboration with Carollee Herter, Alferd Apa, DO regarding development and update of comprehensive plan of care as evidenced by provider attestation and co-signature Inter-disciplinary care team collaboration (see longitudinal plan of care) Comprehensive medication review performed; medication list updated in electronic medical record  Diabetes: Lab Results  Component Value Date   HGBA1C 7.7 (H) 02/14/2021  A1c 03/23/2021 at [facility] was 6.9%  Controlled - (goal A1c <7%) Much improved BG since started using Libre CGM  Insulin dose changed at discharge from [facility] to NPH 22 units at bedtime and 7 units prior to meals but patient changed back to regimen Dr [name] last recommended due to increase in blood glucose.  Reports recent blood glucose at home has been 87 to 180 Current treatment: Relion NPH insulin- inject 35 units at bedtime Relion Regular insulin - inject 14 units prior to each meal.   Metformin XR 543m - 2 Tablets with Breakfast and 2 tablets with Supper (last refilled 04/20/2021 for 90 DS)  Interventions / Recommendations Recommended continue to take medications as directed by Dr SKelton PillarContinue to check BG frequently during [facility]  Hypertension: BP Readings from Last 3 Encounters:  05/15/21 100/84  05/13/21 131/87  05/11/21 (!) 153/89  Blood pressure at goal recently - BP goal <140/90 Not checking BP at home regularly Current treatment: Amlodipine 181mdaily  Hydralazine 5044mvery 8 hours Losartan 100m48mily  Discharge records showed that hydralazine has been stopped  during recent hospitalization but patient reports today that he restarted.  Denies dizziness Patient also states he took his last amlodipine today but that [facility] never transferred from [facility]. Patient to see PCP tomorrow. Given that he was  supposed to stop hydralazine at discharge, would like to see what blood pressure will be tomorrow when he sees PCP. May not need amlodipine.  Interventions:  Counseled on importance of maintaining blood pressure at goal to prevent strokes and kidney damage Reviewed refill history - losartan and hydralazine filled for 90 DS on 04/20/2021  Hyperlipidemia / Heart Disease: Lipid Panel     Component Value Date/Time   CHOL 118 11/28/2020 1502   TRIG 130.0 11/28/2020 1502   HDL 40.00 11/28/2020 1502   CHOLHDL 3 11/28/2020 1502   VLDL 26.0 11/28/2020 1502   LDLCALC 52 11/28/2020 1502  Controlled - LDL goal <70 Current treatment: Pravastatin 25m at bedtime Interventions:  Counseled on LDL goals Recommended continue current regimen for cholesterol  Chronic Pain / Leg Weakness: Currently not controlled Recent hospitalization with start of IVIG for CIDP Patient reports he was doing better until he fell and fractured his foot.  Seeing Dr KLetta Patefor pain management Current treatment:  Tramadol 546m- take 2 tablets up to every 8 hours as needed Methocarbamol 50085m take 1 tablet 4 times a day as needed for muscle spasms Voltaren Gel 1% -  apply to area of pain up to 4 times a day as needed Gabapentin 300m26mtake 4 tablets = 1200mg15mimes a day (increased during recent hospitalization) Duloxetine 60mg 12might Interventions:  Continue current regimen for pain  Updated med list to remove pregabalin - stopped during hospitalization Encouraged patient to follow up with neurology and pain management Patient complained of increase anxiety and difficulty sleeping. Recommended he change duloxetine to take in morning to see if it helps with sleep  Anxiety / Insomnia:  Uncontrolled Patient reports week or so has had difficulty sleeping and more anxiety. Lies awake at night thinking.  Current therapy: none Medication tried in past:  Trazodone - made him feel like a zombie Hydroxyzine -  made him sleepy ("like a benadryl on steroids") Ativan - worked well Citalopram - not helpful Clonazepam - made him moody Escitalopram - not helpful Nortriptyline - made him sleep Interventions:  Recommended he move duloxetine to take in morning to see if sleep improved.  He will see PCP tomorrow and will discuss anxiety / sleeplessness Options could be to retry hydroxyzine or nortriptyline at night for anxiety / sleep since they caused drowsiness in past Also did not see buspirone / Buspar or sertraline / Zoloft listed as tried in the past   Medication Management:  Reviewed refill history. Only see that he is due to refill amlodipine  Interventions:   Will continue to follow adherence and assist patient with refills.  Reminded patient that he needs to answer calls for regular checki-ns so that we can order / request refills BEFORE he runs out of medication.   Patient Goals/Self-Care Activities Over the next 90 days, patient will:  take medications as prescribed focus on medication adherence by utilizing pharmacy service that delivers medications to his home, check glucose before meals and 2 hour after meals using CGM, document, and provide at future appointments collaborate with provider on medication access solutions and refills   Follow up call planned In 3 to 4 weeks      Medication Assistance: None needed at this time .  Patient's preferred pharmacy is:  [id number] - [place], [place] - [address] [place] [address] Phone: [phone] Fax: [phone]  [facility] [place] [facility] [address] [place] [address] Phone: [phone] Fax: [phone]   Follow Up:  Patient agrees to Care Plan and Follow-up.  Plan: The care management team will reach out to the patient again in 3 to 4weeks.   Cherre Robins,  PharmD Clinical Pharmacist [facility] [phone]

## 2021-05-18 ENCOUNTER — Other Ambulatory Visit: Payer: Self-pay

## 2021-05-18 ENCOUNTER — Encounter: Payer: Self-pay | Admitting: Family Medicine

## 2021-05-18 ENCOUNTER — Ambulatory Visit (INDEPENDENT_AMBULATORY_CARE_PROVIDER_SITE_OTHER): Payer: Medicare HMO | Admitting: Family Medicine

## 2021-05-18 VITALS — BP 168/101 | HR 89 | Temp 98.6°F | Resp 16

## 2021-05-18 DIAGNOSIS — Z794 Long term (current) use of insulin: Secondary | ICD-10-CM

## 2021-05-18 DIAGNOSIS — G8929 Other chronic pain: Secondary | ICD-10-CM

## 2021-05-18 DIAGNOSIS — I10 Essential (primary) hypertension: Secondary | ICD-10-CM

## 2021-05-18 DIAGNOSIS — N50819 Testicular pain, unspecified: Secondary | ICD-10-CM

## 2021-05-18 DIAGNOSIS — F410 Panic disorder [episodic paroxysmal anxiety] without agoraphobia: Secondary | ICD-10-CM | POA: Diagnosis not present

## 2021-05-18 DIAGNOSIS — M545 Low back pain, unspecified: Secondary | ICD-10-CM | POA: Diagnosis not present

## 2021-05-18 DIAGNOSIS — R350 Frequency of micturition: Secondary | ICD-10-CM | POA: Diagnosis not present

## 2021-05-18 DIAGNOSIS — G4733 Obstructive sleep apnea (adult) (pediatric): Secondary | ICD-10-CM

## 2021-05-18 DIAGNOSIS — E1165 Type 2 diabetes mellitus with hyperglycemia: Secondary | ICD-10-CM

## 2021-05-18 DIAGNOSIS — R69 Illness, unspecified: Secondary | ICD-10-CM | POA: Diagnosis not present

## 2021-05-18 DIAGNOSIS — E785 Hyperlipidemia, unspecified: Secondary | ICD-10-CM | POA: Diagnosis not present

## 2021-05-18 DIAGNOSIS — G6181 Chronic inflammatory demyelinating polyneuritis: Secondary | ICD-10-CM

## 2021-05-18 DIAGNOSIS — F419 Anxiety disorder, unspecified: Secondary | ICD-10-CM | POA: Diagnosis not present

## 2021-05-18 LAB — POC URINALSYSI DIPSTICK (AUTOMATED)
Bilirubin, UA: NEGATIVE
Blood, UA: NEGATIVE
Glucose, UA: POSITIVE — AB
Ketones, UA: NEGATIVE
Leukocytes, UA: NEGATIVE
Nitrite, UA: NEGATIVE
Protein, UA: NEGATIVE
Spec Grav, UA: 1.025 (ref 1.010–1.025)
Urobilinogen, UA: 0.2 E.U./dL
pH, UA: 5 (ref 5.0–8.0)

## 2021-05-18 MED ORDER — HYDROXYZINE PAMOATE 25 MG PO CAPS: 25.0000 mg | ORAL_CAPSULE | Freq: Three times a day (TID) | ORAL | 0 refills | Status: DC | PRN

## 2021-05-18 MED ORDER — AMLODIPINE BESYLATE 10 MG PO TABS: 10.0000 mg | ORAL_TABLET | Freq: Every day | ORAL | 1 refills | Status: DC

## 2021-05-18 NOTE — Progress Notes (Signed)
Established Patient Office Visit  Subjective:  Patient ID: Tyler Brewer, male    DOB: 01-09-1961  Age: 60 y.o. MRN: [id number]  CC:  Chief Complaint  Patient presents with   Hospitalization Follow-up    HPI Tyler Brewer presents for er f/u and hosp--- he was hosp 9/7 for cipd and then was in er for fracture L big toe, and tibia.   He is seeing neuro for cipd and pain management and is seeing ortho for his fracture.  He is struggling with anxiety and panic attacks and insomnia.  He is going to make an app with a psych for help with this   he is also trying to get into a rehab/ nursing home due to him falling a lot SW is helping with this   Past Medical History:  Diagnosis Date   Arthritis    CAD (coronary artery disease)  cardiac cath with moderate disease in a septal branch of the ramus intermedius 04/01/2012   CIDP (chronic inflammatory demyelinating polyneuropathy) ([place])    Depression    Diabetes mellitus    poorly controlled by his report   History of narcotic addiction ([place])    past history of back pain   Hypercholesteremia    Hypertension    IBS (irritable bowel syndrome)    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[place])    Neuropathy    Obesity    Max weight was 390   OSA on CPAP    Panic attacks    Testosterone deficiency    Vertigo     Past Surgical History:  Procedure Laterality Date   CARDIAC CATHETERIZATION     IR FLUORO GUIDE CV LINE RIGHT  11/10/2020   IR US GUIDE VASC ACCESS RIGHT  11/10/2020   LEFT HEART CATHETERIZATION WITH CORONARY ANGIOGRAM N/A 03/31/2012   Procedure: LEFT HEART CATHETERIZATION WITH CORONARY ANGIOGRAM;  Surgeon: Leonie Man, MD;  Location: [facility] CATH LAB;  Service: Cardiovascular;  Laterality: N/A;    Family History  Problem Relation Age of Onset   Diabetes type II Father    Hypertension Father    Pancreatic disease Father 1       Deceased   Healthy Mother    Healthy Sister    Healthy Son    Healthy Daughter    Parkinson's disease  Maternal Grandmother     Social History   Socioeconomic History   Marital status: Single    Spouse name: Not on file   Number of children: 2   Years of education: Not on file   Highest education level: Not on file  Occupational History   Not on file  Tobacco Use   Smoking status: Former    Packs/day: 0.50    Types: Cigarettes    Quit date: 04/27/2021    Years since quitting: 0.0   Smokeless tobacco: Never  Vaping Use   Vaping Use: Never used  Substance and Sexual Activity   Alcohol use: No   Drug use: Not Currently    Types: Methamphetamines   Sexual activity: Yes  Other Topics Concern   Not on file  Social History Narrative     Has 2 grown children.     Works as a Barrister's clerk.  Education: Oceanographer.   Right Handed   Drinks Caffeine    Mother recently moved and sold her house-- pt is living with a friend in a trailer    Social Determinants of Health   Financial Resource Strain: Medium Risk   Difficulty of  Paying Living Expenses: Somewhat hard  Food Insecurity: No Food Insecurity   Worried About Running Out of Food in the Last Year: Never true   Ran Out of Food in the Last Year: Never true  Transportation Needs: Unmet Transportation Needs   Lack of Transportation (Medical): Yes   Lack of Transportation (Non-Medical): Yes  Physical Activity: Not on file  Stress: Not on file  Social Connections: Not on file  Intimate Partner Violence: Not on file    Outpatient Medications Prior to Visit  Medication Sig Dispense Refill   acetaminophen (TYLENOL) 500 MG tablet Take 500-1,000 mg by mouth every 6 (six) hours as needed for mild pain.     COMFORT EZ INSULIN SYRINGE 31G X 5/16" 1 ML MISC      Cyanocobalamin (VITAMIN B-12 PO) Take 1 tablet by mouth daily.     diclofenac Sodium (VOLTAREN) 1 % GEL Apply 4 g topically 4 (four) times daily as needed (joint pain).     DULoxetine (CYMBALTA) 60 MG capsule Take 1 capsule by mouth daily.     furosemide (LASIX) 20 MG tablet TAKE 1  TABLET(20 MG) BY MOUTH DAILY (Patient taking differently: Take 40 mg by mouth daily.) 90 tablet 0   gabapentin (NEURONTIN) 300 MG capsule Take 1,200 mg by mouth 3 (three) times daily.     hydrALAZINE (APRESOLINE) 50 MG tablet Take 1 tablet (50 mg total) by mouth every 8 (eight) hours. 270 tablet 0   ibuprofen (ADVIL) 200 MG tablet Take 400 mg by mouth every 6 (six) hours as needed for mild pain.     Immune Globulin 10% (PRIVIGEN) 5 GM/50ML SOLN Inject into the vein. Every 3 weeks     insulin NPH Human (NOVOLIN N) 100 UNIT/ML injection Inject 35 Units into the skin at bedtime.     Insulin Pen Needle 31G X 5 MM MISC 1 Device by Does not apply route in the morning, at noon, in the evening, and at bedtime. 400 each 2   Insulin Regular Human (HUMULIN R) 100 UNIT/ML KwikPen Inject 14 Units into the skin 3 (three) times daily before meals. Uses [employer] Relion R insulin     losartan (COZAAR) 100 MG tablet TAKE ONE TABLET BY MOUTH DAILY 90 tablet 1   MAGNESIUM PO Take 1 tablet by mouth daily.     meclizine (ANTIVERT) 25 MG tablet Take 1 tablet (25 mg total) by mouth 3 (three) times daily as needed for dizziness. 30 tablet 0   meloxicam (MOBIC) 15 MG tablet Take 1 tablet (15 mg total) by mouth daily. 30 tablet 1   Menthol, Topical Analgesic, (ICY HOT EX) Apply 1 application topically as needed (pain).     metFORMIN (GLUCOPHAGE-XR) 500 MG 24 hr tablet Take 2 tablets (1,000 mg total) by mouth in the morning and at bedtime. 360 tablet 1   methocarbamol (ROBAXIN) 500 MG tablet Take 500 mg by mouth 4 (four) times daily as needed for muscle spasms.     Multiple Vitamin (MULTIVITAMIN) tablet Take 1 tablet by mouth daily.     pravastatin (PRAVACHOL) 40 MG tablet Take 1 tablet (40 mg total) by mouth daily. 90 tablet 1   tamsulosin (FLOMAX) 0.4 MG CAPS capsule TAKE ONE CAPSULE BY MOUTH ONE TIME DAILY 90 capsule 1   traMADol (ULTRAM) 50 MG tablet 2 po q6 h prn (Patient taking differently: Take 100 mg by mouth every 6  (six) hours as needed for moderate pain.) 120 tablet 1   amLODipine (NORVASC) 10  MG tablet Take 1 tablet (10 mg total) by mouth daily. 90 tablet 1   No facility-administered medications prior to visit.    No Known Allergies  ROS Review of Systems  Constitutional:  Negative for chills and fever.  HENT:  Negative for congestion and hearing loss.   Eyes:  Negative for discharge.  Respiratory:  Negative for cough and shortness of breath.   Cardiovascular:  Negative for chest pain, palpitations and leg swelling.  Gastrointestinal:  Negative for abdominal pain, blood in stool, constipation, diarrhea, nausea and vomiting.  Genitourinary:  Negative for dysuria, frequency, hematuria and urgency.  Musculoskeletal:  Negative for back pain and myalgias.  Skin:  Negative for rash.  Allergic/Immunologic: Negative for environmental allergies.  Neurological:  Positive for weakness. Negative for dizziness and headaches.  Hematological:  Does not bruise/bleed easily.  Psychiatric/Behavioral:  Positive for sleep disturbance. Negative for suicidal ideas. The patient is nervous/anxious.      Objective:    Physical Exam Vitals and nursing note reviewed.  Constitutional:      Appearance: He is well-developed.  HENT:     Head: Normocephalic and atraumatic.  Eyes:     Pupils: Pupils are equal, round, and reactive to light.  Neck:     Thyroid: No thyromegaly.  Cardiovascular:     Rate and Rhythm: Normal rate and regular rhythm.     Heart sounds: No murmur heard. Pulmonary:     Effort: Pulmonary effort is normal. No respiratory distress.     Breath sounds: Normal breath sounds. No wheezing or rales.  Chest:     Chest wall: No tenderness.  Musculoskeletal:        General: No tenderness.     Cervical back: Normal range of motion and neck supple.  Skin:    General: Skin is warm and dry.  Neurological:     Mental Status: He is alert and oriented to person, place, and time.  Psychiatric:         Behavior: Behavior normal.        Thought Content: Thought content normal.        Judgment: Judgment normal.    BP (!) 168/101 (BP Location: Right Arm, Cuff Size: Large) Comment: takes bp meds in evenings  Pulse 89   Temp 98.6 F (37 C) (Oral)   Resp 16   SpO2 99%  Wt Readings from Last 3 Encounters:  05/15/21 290 lb (131.5 kg)  05/12/21 290 lb (131.5 kg)  05/11/21 290 lb (131.5 kg)     Health Maintenance Due  Topic Date Due   HIV Screening  Never done   COLONOSCOPY (Pts 45-26yrs Insurance coverage will need to be confirmed)  Never done   OPHTHALMOLOGY EXAM  05/02/2017    There are no preventive care reminders to display for this patient.  Lab Results  Component Value Date   TSH 2.57 02/14/2021   Lab Results  Component Value Date   WBC 6.6 05/15/2021   HGB 13.7 05/15/2021   HCT 41.3 05/15/2021   MCV 88.6 05/15/2021   PLT 268 05/15/2021   Lab Results  Component Value Date   NA 133 (L) 05/15/2021   K 3.8 05/15/2021   CO2 26 05/15/2021   GLUCOSE 315 (H) 05/15/2021   BUN 23 (H) 05/15/2021   CREATININE 1.05 05/15/2021   BILITOT 0.6 05/15/2021   ALKPHOS 66 05/15/2021   AST 18 05/15/2021   ALT 26 05/15/2021   PROT 8.1 05/15/2021   ALBUMIN 3.7 05/15/2021  CALCIUM 9.6 05/15/2021   ANIONGAP 12 05/15/2021   GFR 94.64 02/14/2021   Lab Results  Component Value Date   CHOL 136 02/14/2021   Lab Results  Component Value Date   HDL 55.50 02/14/2021   Lab Results  Component Value Date   LDLCALC 60 02/14/2021   Lab Results  Component Value Date   TRIG 102.0 02/14/2021   Lab Results  Component Value Date   CHOLHDL 2 02/14/2021   Lab Results  Component Value Date   HGBA1C 7.7 (H) 02/14/2021      Assessment & Plan:   Problem List Items Addressed This Visit       Unprioritized   OSA (obstructive sleep apnea) (Chronic)   Relevant Orders   Ambulatory referral to Pulmonology   HTN (hypertension) (Chronic)    Well controlled, no changes to meds.  Encouraged heart healthy diet such as the DASH diet and exercise as tolerated.       Hyperlipemia (Chronic)    Encourage heart healthy diet such as MIND or DASH diet, increase exercise, avoid trans fats, simple carbohydrates and processed foods, consider a krill or fish or flaxseed oil cap daily.       Relevant Orders   Hemoglobin A1c   CBC with Differential/Platelet   Comprehensive metabolic panel   Lipid panel   Diabetes mellitus (HCC)   Relevant Orders   Hemoglobin A1c   CBC with Differential/Platelet   Comprehensive metabolic panel   Lipid panel   Chronic low back pain    With cidp Per pain management       CIDP (chronic inflammatory demyelinating polyneuropathy) (HCC)    Per neuro      Panic attacks    Add hydroxyzine  Pt will f/u psych       Type 2 diabetes mellitus with hyperglycemia, with long-term current use of insulin (HCC)    F/u endo       Other Visit Diagnoses     Anxiety    -  Primary   Pain in testicle, unspecified laterality       Relevant Orders   Ambulatory referral to Urology   Urinary frequency       Relevant Orders   POCT Urinalysis Dipstick (Automated) (Completed)       Meds ordered this encounter  Medications   DISCONTD: hydrOXYzine (VISTARIL) 25 MG capsule    Sig: Take 1 capsule (25 mg total) by mouth every 8 (eight) hours as needed.    Dispense:  30 capsule    Refill:  0    Follow-up: Return in about 3 months (around 08/17/2021), or if symptoms worsen or fail to improve.    [name], DO

## 2021-05-18 NOTE — Assessment & Plan Note (Signed)
Add hydroxyzine  Pt will f/u psych

## 2021-05-18 NOTE — Assessment & Plan Note (Signed)
Encourage heart healthy diet such as MIND or DASH diet, increase exercise, avoid trans fats, simple carbohydrates and processed foods, consider a krill or fish or flaxseed oil cap daily.  °

## 2021-05-18 NOTE — Assessment & Plan Note (Signed)
F/u endo  

## 2021-05-18 NOTE — Assessment & Plan Note (Signed)
Per neuro 

## 2021-05-18 NOTE — Assessment & Plan Note (Signed)
With cidp Per pain management

## 2021-05-18 NOTE — Assessment & Plan Note (Signed)
Well controlled, no changes to meds. Encouraged heart healthy diet such as the DASH diet and exercise as tolerated.  °

## 2021-05-18 NOTE — Patient Instructions (Signed)
Generalized Anxiety Disorder, Adult Generalized anxiety disorder (GAD) is a mental health condition. Unlike normal worries, anxiety related to GAD is not triggered by a specific event. These worries do not fade or get better with time. GAD interferes with relationships, work, and school. GAD symptoms can vary from mild to severe. People with severe GAD can have intense waves of anxiety with physical symptoms that are similar to panic attacks. What are the causes? The exact cause of GAD is not known, but the following are believed to have an impact: Differences in natural brain chemicals. Genes passed down from parents to children. Differences in the way threats are perceived. Development during childhood. Personality. What increases the risk? The following factors may make you more likely to develop this condition: Being male. Having a family history of anxiety disorders. Being very shy. Experiencing very stressful life events, such as the death of a loved one. Having a very stressful family environment. What are the signs or symptoms? People with GAD often worry excessively about many things in their lives, such as their health and family. Symptoms may also include: Mental and emotional symptoms: Worrying excessively about natural disasters. Fear of being late. Difficulty concentrating. Fears that others are judging your performance. Physical symptoms: Fatigue. Headaches, muscle tension, muscle twitches, trembling, or feeling shaky. Feeling like your heart is pounding or beating very fast. Feeling out of breath or like you cannot take a deep breath. Having trouble falling asleep or staying asleep, or experiencing restlessness. Sweating. Nausea, diarrhea, or irritable bowel syndrome (IBS). Behavioral symptoms: Experiencing erratic moods or irritability. Avoidance of new situations. Avoidance of people. Extreme difficulty making decisions. How is this diagnosed? This condition  is diagnosed based on your symptoms and medical history. You will also have a physical exam. Your health care provider may perform tests to rule out other possible causes of your symptoms. To be diagnosed with GAD, a person must have anxiety that: Is out of his or her control. Affects several different aspects of his or her life, such as work and relationships. Causes distress that makes him or her unable to take part in normal activities. Includes at least three symptoms of GAD, such as restlessness, fatigue, trouble concentrating, irritability, muscle tension, or sleep problems. Before your health care provider can confirm a diagnosis of GAD, these symptoms must be present more days than they are not, and they must last for 6 months or longer. How is this treated? This condition may be treated with: Medicine. Antidepressant medicine is usually prescribed for long-term daily control. Anti-anxiety medicines may be added in severe cases, especially when panic attacks occur. Talk therapy (psychotherapy). Certain types of talk therapy can be helpful in treating GAD by providing support, education, and guidance. Options include: Cognitive behavioral therapy (CBT). People learn coping skills and self-calming techniques to ease their physical symptoms. They learn to identify unrealistic thoughts and behaviors and to replace them with more appropriate thoughts and behaviors. Acceptance and commitment therapy (ACT). This treatment teaches people how to be mindful as a way to cope with unwanted thoughts and feelings. Biofeedback. This process trains you to manage your body's response (physiological response) through breathing techniques and relaxation methods. You will work with a therapist while machines are used to monitor your physical symptoms. Stress management techniques. These include yoga, meditation, and exercise. A mental health specialist can help determine which treatment is best for you. Some  people see improvement with one type of therapy. However, other people require a combination   of therapies. Follow these instructions at home: Lifestyle Maintain a consistent routine and schedule. Anticipate stressful situations. Create a plan, and allow extra time to work with your plan. Practice stress management or self-calming techniques that you have learned from your therapist or your health care provider. General instructions Take over-the-counter and prescription medicines only as told by your health care provider. Understand that you are likely to have setbacks. Accept this and be kind to yourself as you persist to take better care of yourself. Recognize and accept your accomplishments, even if you judge them as small. Keep all follow-up visits as told by your health care provider. This is important. Contact a health care provider if: Your symptoms do not get better. Your symptoms get worse. You have signs of depression, such as: A persistently sad or irritable mood. Loss of enjoyment in activities that used to bring you joy. Change in weight or eating. Changes in sleeping habits. Avoiding friends or family members. Loss of energy for normal tasks. Feelings of guilt or worthlessness. Get help right away if: You have serious thoughts about hurting yourself or others. If you ever feel like you may hurt yourself or others, or have thoughts about taking your own life, get help right away. Go to your nearest emergency department or: Call your local emergency services (911 in the [place] Call a suicide crisis helpline, such as the National Suicide Prevention Lifeline at 1-800-273-8255. This is open 24 hours a day in the [place] Text the Crisis Text Line at 741741 (in the [place] Summary Generalized anxiety disorder (GAD) is a mental health condition that involves worry that is not triggered by a specific event. People with GAD often worry excessively about many things in their lives, such  as their health and family. GAD may cause symptoms such as restlessness, trouble concentrating, sleep problems, frequent sweating, nausea, diarrhea, headaches, and trembling or muscle twitching. A mental health specialist can help determine which treatment is best for you. Some people see improvement with one type of therapy. However, other people require a combination of therapies. This information is not intended to replace advice given to you by your health care provider. Make sure you discuss any questions you have with your health care provider. Document Revised: 05/27/2019 Document Reviewed: 05/27/2019 Elsevier Patient Education  2022 [employer]  

## 2021-05-19 DIAGNOSIS — E1165 Type 2 diabetes mellitus with hyperglycemia: Secondary | ICD-10-CM | POA: Diagnosis not present

## 2021-05-19 LAB — COMPREHENSIVE METABOLIC PANEL
ALT: 25 U/L (ref 0–53)
AST: 11 U/L (ref 0–37)
Albumin: 3.9 g/dL (ref 3.5–5.2)
Alkaline Phosphatase: 91 U/L (ref 39–117)
BUN: 18 mg/dL (ref 6–23)
CO2: 24 mEq/L (ref 19–32)
Calcium: 9.1 mg/dL (ref 8.4–10.5)
Chloride: 99 mEq/L (ref 96–112)
Creatinine, Ser: 0.87 mg/dL (ref 0.40–1.50)
GFR: 95.44 mL/min (ref >60.00)
Glucose, Bld: 211 mg/dL — ABNORMAL HIGH (ref 70–99)
Potassium: 3.9 mEq/L (ref 3.5–5.1)
Sodium: 133 mEq/L — ABNORMAL LOW (ref 135–145)
Total Bilirubin: 0.3 mg/dL (ref 0.2–1.2)
Total Protein: 7.3 g/dL (ref 6.0–8.3)

## 2021-05-19 LAB — CBC WITH DIFFERENTIAL/PLATELET
Basophils Absolute: 0.1 10*3/uL (ref 0.0–0.1)
Basophils Relative: 1.2 % (ref 0.0–3.0)
Eosinophils Absolute: 0.3 10*3/uL (ref 0.0–0.7)
Eosinophils Relative: 4.1 % (ref 0.0–5.0)
HCT: 38.3 % — ABNORMAL LOW (ref 39.0–52.0)
Hemoglobin: 13.1 g/dL (ref 13.0–17.0)
Lymphocytes Relative: 22.8 % (ref 12.0–46.0)
Lymphs Abs: 1.4 10*3/uL (ref 0.7–4.0)
MCHC: 34.2 g/dL (ref 30.0–36.0)
MCV: 86.7 fl (ref 78.0–100.0)
Monocytes Absolute: 0.6 10*3/uL (ref 0.1–1.0)
Monocytes Relative: 9.5 % (ref 3.0–12.0)
Neutro Abs: 3.9 10*3/uL (ref 1.4–7.7)
Neutrophils Relative %: 62.4 % (ref 43.0–77.0)
Platelets: 299 10*3/uL (ref 150.0–400.0)
RBC: 4.42 Mil/uL (ref 4.22–5.81)
RDW: 12.8 % (ref 11.5–15.5)
WBC: 6.3 10*3/uL (ref 4.0–10.5)

## 2021-05-19 LAB — LIPID PANEL
Cholesterol: 140 mg/dL (ref 0–200)
HDL: 56.6 mg/dL (ref >39.00)
LDL Cholesterol: 63 mg/dL (ref 0–99)
NonHDL: 83.7
Total CHOL/HDL Ratio: 2
Triglycerides: 104 mg/dL (ref 0.0–149.0)
VLDL: 20.8 mg/dL (ref 0.0–40.0)

## 2021-05-19 LAB — HEMOGLOBIN A1C: Hgb A1c MFr Bld: 7.5 % — ABNORMAL HIGH (ref 4.6–6.5)

## 2021-05-22 ENCOUNTER — Ambulatory Visit (INDEPENDENT_AMBULATORY_CARE_PROVIDER_SITE_OTHER): Payer: Medicare HMO | Admitting: *Deleted

## 2021-05-22 ENCOUNTER — Other Ambulatory Visit: Payer: Self-pay | Admitting: Family Medicine

## 2021-05-22 DIAGNOSIS — F410 Panic disorder [episodic paroxysmal anxiety] without agoraphobia: Secondary | ICD-10-CM

## 2021-05-22 DIAGNOSIS — R251 Tremor, unspecified: Secondary | ICD-10-CM

## 2021-05-22 DIAGNOSIS — Z87891 Personal history of nicotine dependence: Secondary | ICD-10-CM | POA: Diagnosis not present

## 2021-05-22 DIAGNOSIS — R2689 Other abnormalities of gait and mobility: Secondary | ICD-10-CM

## 2021-05-22 DIAGNOSIS — E7800 Pure hypercholesterolemia, unspecified: Secondary | ICD-10-CM | POA: Diagnosis not present

## 2021-05-22 DIAGNOSIS — Z794 Long term (current) use of insulin: Secondary | ICD-10-CM

## 2021-05-22 DIAGNOSIS — G8929 Other chronic pain: Secondary | ICD-10-CM

## 2021-05-22 DIAGNOSIS — F988 Other specified behavioral and emotional disorders with onset usually occurring in childhood and adolescence: Secondary | ICD-10-CM | POA: Diagnosis not present

## 2021-05-22 DIAGNOSIS — I10 Essential (primary) hypertension: Secondary | ICD-10-CM | POA: Diagnosis not present

## 2021-05-22 DIAGNOSIS — M1990 Unspecified osteoarthritis, unspecified site: Secondary | ICD-10-CM | POA: Diagnosis not present

## 2021-05-22 DIAGNOSIS — F419 Anxiety disorder, unspecified: Secondary | ICD-10-CM

## 2021-05-22 DIAGNOSIS — G4733 Obstructive sleep apnea (adult) (pediatric): Secondary | ICD-10-CM | POA: Diagnosis not present

## 2021-05-22 DIAGNOSIS — Z9181 History of falling: Secondary | ICD-10-CM | POA: Diagnosis not present

## 2021-05-22 DIAGNOSIS — E1142 Type 2 diabetes mellitus with diabetic polyneuropathy: Secondary | ICD-10-CM | POA: Diagnosis not present

## 2021-05-22 DIAGNOSIS — F32A Depression, unspecified: Secondary | ICD-10-CM | POA: Diagnosis not present

## 2021-05-22 DIAGNOSIS — I2510 Atherosclerotic heart disease of native coronary artery without angina pectoris: Secondary | ICD-10-CM | POA: Diagnosis not present

## 2021-05-22 DIAGNOSIS — R531 Weakness: Secondary | ICD-10-CM

## 2021-05-22 DIAGNOSIS — Z6841 Body Mass Index (BMI) 40.0 and over, adult: Secondary | ICD-10-CM | POA: Diagnosis not present

## 2021-05-22 DIAGNOSIS — E0842 Diabetes mellitus due to underlying condition with diabetic polyneuropathy: Secondary | ICD-10-CM

## 2021-05-22 DIAGNOSIS — E1121 Type 2 diabetes mellitus with diabetic nephropathy: Secondary | ICD-10-CM | POA: Diagnosis not present

## 2021-05-22 DIAGNOSIS — R29898 Other symptoms and signs involving the musculoskeletal system: Secondary | ICD-10-CM

## 2021-05-22 DIAGNOSIS — K589 Irritable bowel syndrome without diarrhea: Secondary | ICD-10-CM | POA: Diagnosis not present

## 2021-05-22 DIAGNOSIS — M545 Low back pain, unspecified: Secondary | ICD-10-CM

## 2021-05-22 DIAGNOSIS — E1136 Type 2 diabetes mellitus with diabetic cataract: Secondary | ICD-10-CM | POA: Diagnosis not present

## 2021-05-22 DIAGNOSIS — G6181 Chronic inflammatory demyelinating polyneuritis: Secondary | ICD-10-CM

## 2021-05-22 DIAGNOSIS — M5441 Lumbago with sciatica, right side: Secondary | ICD-10-CM | POA: Diagnosis not present

## 2021-05-22 DIAGNOSIS — R600 Localized edema: Secondary | ICD-10-CM

## 2021-05-22 DIAGNOSIS — E1165 Type 2 diabetes mellitus with hyperglycemia: Secondary | ICD-10-CM

## 2021-05-22 DIAGNOSIS — N401 Enlarged prostate with lower urinary tract symptoms: Secondary | ICD-10-CM | POA: Diagnosis not present

## 2021-05-22 DIAGNOSIS — N39498 Other specified urinary incontinence: Secondary | ICD-10-CM | POA: Diagnosis not present

## 2021-05-22 DIAGNOSIS — J849 Interstitial pulmonary disease, unspecified: Secondary | ICD-10-CM | POA: Diagnosis not present

## 2021-05-22 NOTE — Chronic Care Management (AMB) (Signed)
Chronic Care Management    Clinical Social Work Note  05/22/2021 Name: Tyler Brewer MRN: [id number] DOB: 05-23-1961  Tyler Brewer is a 60 y.o. year old male who is a primary care patient of [name], DO. The CCM team was consulted to assist the patient with chronic disease management and/or care coordination needs related to: Level of Care Concerns.   Engaged with patient by telephone for follow-up visit in response to provider referral for social work chronic care management and care coordination services.   Consent to Services:  The patient was given information about Chronic Care Management services, agreed to services, and gave verbal consent prior to initiation of services.  Please see initial visit note for detailed documentation.   Patient agreed to services and consent obtained.   Assessment: Review of patient past medical history, allergies, medications, and health status, including review of relevant consultants reports was performed today as part of a comprehensive evaluation and provision of chronic care management and care coordination services.     SDOH (Social Determinants of Health) assessments and interventions performed:    Advanced Directives Status: Not addressed in this encounter.  CCM Care Plan  No Known Allergies  Outpatient Encounter Medications as of 05/22/2021  Medication Sig   acetaminophen (TYLENOL) 500 MG tablet Take 500-1,000 mg by mouth every 6 (six) hours as needed for mild pain.   amLODipine (NORVASC) 10 MG tablet Take 1 tablet (10 mg total) by mouth daily.   COMFORT EZ INSULIN SYRINGE 31G X 5/16" 1 ML MISC    Cyanocobalamin (VITAMIN B-12 PO) Take 1 tablet by mouth daily.   diclofenac Sodium (VOLTAREN) 1 % GEL Apply 4 g topically 4 (four) times daily as needed (joint pain).   DULoxetine (CYMBALTA) 60 MG capsule Take 1 capsule by mouth daily.   furosemide (LASIX) 20 MG tablet TAKE 1 TABLET(20 MG) BY MOUTH DAILY (Patient taking  differently: Take 40 mg by mouth daily.)   gabapentin (NEURONTIN) 300 MG capsule Take 1,200 mg by mouth 3 (three) times daily.   hydrALAZINE (APRESOLINE) 50 MG tablet Take 1 tablet (50 mg total) by mouth every 8 (eight) hours.   ibuprofen (ADVIL) 200 MG tablet Take 400 mg by mouth every 6 (six) hours as needed for mild pain.   Immune Globulin 10% (PRIVIGEN) 5 GM/50ML SOLN Inject into the vein. Every 3 weeks   insulin NPH Human (NOVOLIN N) 100 UNIT/ML injection Inject 35 Units into the skin at bedtime.   Insulin Pen Needle 31G X 5 MM MISC 1 Device by Does not apply route in the morning, at noon, in the evening, and at bedtime.   Insulin Regular Human (HUMULIN R) 100 UNIT/ML KwikPen Inject 14 Units into the skin 3 (three) times daily before meals. Uses [employer] Relion R insulin   losartan (COZAAR) 100 MG tablet TAKE ONE TABLET BY MOUTH DAILY   MAGNESIUM PO Take 1 tablet by mouth daily.   meclizine (ANTIVERT) 25 MG tablet Take 1 tablet (25 mg total) by mouth 3 (three) times daily as needed for dizziness.   meloxicam (MOBIC) 15 MG tablet Take 1 tablet (15 mg total) by mouth daily.   Menthol, Topical Analgesic, (ICY HOT EX) Apply 1 application topically as needed (pain).   metFORMIN (GLUCOPHAGE-XR) 500 MG 24 hr tablet Take 2 tablets (1,000 mg total) by mouth in the morning and at bedtime.   methocarbamol (ROBAXIN) 500 MG tablet Take 500 mg by mouth 4 (four) times daily as needed for muscle  spasms.   Multiple Vitamin (MULTIVITAMIN) tablet Take 1 tablet by mouth daily.   pravastatin (PRAVACHOL) 40 MG tablet Take 1 tablet (40 mg total) by mouth daily.   tamsulosin (FLOMAX) 0.4 MG CAPS capsule TAKE ONE CAPSULE BY MOUTH ONE TIME DAILY   traMADol (ULTRAM) 50 MG tablet 2 po q6 h prn (Patient taking differently: Take 100 mg by mouth every 6 (six) hours as needed for moderate pain.)   No facility-administered encounter medications on file as of 05/22/2021.    Patient Active Problem List   Diagnosis Date  Noted   CIDP (chronic inflammatory demyelinating polyneuropathy) ([place]) 01/12/2021   Diabetes mellitus ([place]) 01/03/2021   Type 2 diabetes mellitus with hyperglycemia, with long-term current use of insulin ([place]) 01/03/2021   Lower extremity edema 11/28/2020   Chronic low back pain 11/28/2020   Psoriatic arthritis ([place]) 11/28/2020   Thoracic aortic aneurysm without rupture 11/28/2020   Bilateral leg weakness 11/05/2020   Acute left-sided low back pain with right-sided sciatica 07/19/2020   Hip pain, acute, left 07/05/2020   Tremor 07/05/2020   Weakness 07/05/2020   Balance problem 03/11/2020   Tinea cruris 03/11/2020   Cellulitis of left groin 03/11/2020   Acute pain of right shoulder 03/11/2020   Interstitial lung disease ([place]) 05/02/2017   Pansinusitis 09/26/2016   Diabetic polyneuropathy associated with diabetes mellitus due to underlying condition ([place]) 05/08/2016   Methamphetamine use disorder, severe, dependence ([place]) 02/11/2016   Substance induced mood disorder ([place]) 02/11/2016   Exertional dyspnea 11/30/2015   ADD (attention deficit disorder) 09/29/2015   Binge eating 09/29/2015   Syncope 05/05/2012   Diarrhea 05/05/2012   Panic attacks 05/05/2012   OSA (obstructive sleep apnea) 05/05/2012   HTN (hypertension) 05/05/2012   Dyslipidemia 05/05/2012   CAD (coronary artery disease)  cardiac cath with moderate disease in a septal branch of the ramus intermedius 04/01/2012   Chest pain 04/01/2012   Drug abuse and dependence ([place]) 04/01/2012   Family history of coronary artery disease 03/31/2012   Sleep apnea, on C-pap 03/31/2012   Hyperlipemia 03/30/2012   HTN (hypertension), benign 03/30/2012   Morbid obesity ([place]) 03/30/2012   DM type 2, uncontrolled, with neuropathy ([place]) 03/30/2012   Depression with suicidal ideation 03/30/2012    Conditions to be addressed/monitored: CAD and HTN.  Limited Social Support, Level of Care Concerns, ADL/IADL Limitations, Mental Health  Concerns, Limited Access to Caregiver, Memory Deficits, and Lacks Knowledge of [employer].  Care Plan : LCSW Plan of Care  Updates made by Francis Gaines, LCSW since 05/22/2021 12:00 AM     Problem: Receive Long-Term Placement in an [place].   Priority: High     Goal: Receive Long-Term Placement in an [place].   Start Date: 03/30/2021  Expected End Date: 06/05/2021  This Visit's Progress: On track  Recent Progress: On track  Priority: High  Note:   Current Barriers:   Patient with History of Falls/Fall Risk, Unsteady Gait, Impaired Mobility, Weakness, Tremor, Syncope, Balance Problem and Lower Extremity Edema needs assistance with initiating short-term rehabilitative services in a skilled nursing facility or long-term care in an assisted living facility. Clinical Goal(s):  Patient will receive assistance with initiating placement, whether it be assisted or skilled level care.   Interventions: Collaboration with Primary Care Physician, Dr. [name] regarding development and update of comprehensive plan of care as evidenced by provider attestation and co-signature. Patient Goals/Self-Care Activities: Continued collaboration with Deirdre Pippins, Admissions Coordinator at [facility] [place] [phone]), facility of choice, to coordinate placement.   Periodically contact representative with the [place] [phone]) to check the status of your application for Adult Medicaid, submitted on 05/17/2021. LCSW collaboration with Primary Care Physician, Dr. [name] to request order for home health social worker through [facility] [phone]). LCSW collaboration with Primary Care Physician, Dr. [name] to request review and signature of updated [place]-2 Form. Please contact LCSW directly (# [phone]) if you have questions, need  assistance, or if additional social work needs are identified between now and our next scheduled telephone outreach call. Follow-Up:  06/05/2021 at 10:30am   [place] Licensed Clinical Social Worker [place] [phone]

## 2021-05-22 NOTE — Patient Instructions (Signed)
Visit Information  PATIENT GOALS:  Goals Addressed               This Visit's Progress     Receive Long-Term Placement in an [place]. (pt-stated)   On track     Timeframe:  Short-Range Goal Priority:  High Start Date:  03/30/2021                        Expected End Date: 06/05/2021  Follow-Up Plan:  06/05/2021 at 10:30am  Patient Goals/Self-Care Activities: Continued collaboration with Deirdre Pippins, Admissions Coordinator at [place] [phone]), facility of choice, to coordinate placement.   Periodically contact representative with the [place] [phone]) to check the status of your application for Adult Medicaid, submitted on 05/17/2021. LCSW collaboration with Primary Care Physician, Dr. [name] to request order for home health social worker through [facility] [phone]). LCSW collaboration with Primary Care Physician, Dr. [name] to request review and signature of updated [place] Form. Please contact LCSW directly (# [phone]) if you have questions, need assistance, or if additional social work needs are identified between now and our next scheduled telephone outreach call.        Patient verbalizes understanding of instructions provided today and agrees to view in [place].   Telephone follow up appointment with care management team member scheduled for:  06/05/2021 at 10:30am  [place] Licensed Clinical Social Worker [place] [phone]

## 2021-05-23 ENCOUNTER — Telehealth: Payer: Self-pay | Admitting: Family Medicine

## 2021-05-23 DIAGNOSIS — I10 Essential (primary) hypertension: Secondary | ICD-10-CM | POA: Diagnosis not present

## 2021-05-23 DIAGNOSIS — R030 Elevated blood-pressure reading, without diagnosis of hypertension: Secondary | ICD-10-CM | POA: Diagnosis not present

## 2021-05-23 DIAGNOSIS — G6181 Chronic inflammatory demyelinating polyneuritis: Secondary | ICD-10-CM | POA: Diagnosis not present

## 2021-05-23 DIAGNOSIS — T50Z15A Adverse effect of immunoglobulin, initial encounter: Secondary | ICD-10-CM | POA: Diagnosis not present

## 2021-05-23 DIAGNOSIS — R232 Flushing: Secondary | ICD-10-CM | POA: Diagnosis not present

## 2021-05-23 NOTE — Telephone Encounter (Signed)
Erica from [facility] [employer] [facility] faxed over a physical therapy report that needed [employer] concurrence for a power wheelchair. It can be faxed to [phone]

## 2021-05-24 ENCOUNTER — Telehealth: Payer: Self-pay | Admitting: Family Medicine

## 2021-05-24 DIAGNOSIS — M1990 Unspecified osteoarthritis, unspecified site: Secondary | ICD-10-CM | POA: Diagnosis not present

## 2021-05-24 DIAGNOSIS — Z87891 Personal history of nicotine dependence: Secondary | ICD-10-CM | POA: Diagnosis not present

## 2021-05-24 DIAGNOSIS — I2510 Atherosclerotic heart disease of native coronary artery without angina pectoris: Secondary | ICD-10-CM | POA: Diagnosis not present

## 2021-05-24 DIAGNOSIS — Z6841 Body Mass Index (BMI) 40.0 and over, adult: Secondary | ICD-10-CM | POA: Diagnosis not present

## 2021-05-24 DIAGNOSIS — N39498 Other specified urinary incontinence: Secondary | ICD-10-CM | POA: Diagnosis not present

## 2021-05-24 DIAGNOSIS — N401 Enlarged prostate with lower urinary tract symptoms: Secondary | ICD-10-CM | POA: Diagnosis not present

## 2021-05-24 DIAGNOSIS — I10 Essential (primary) hypertension: Secondary | ICD-10-CM | POA: Diagnosis not present

## 2021-05-24 DIAGNOSIS — E7800 Pure hypercholesterolemia, unspecified: Secondary | ICD-10-CM | POA: Diagnosis not present

## 2021-05-24 DIAGNOSIS — F988 Other specified behavioral and emotional disorders with onset usually occurring in childhood and adolescence: Secondary | ICD-10-CM | POA: Diagnosis not present

## 2021-05-24 DIAGNOSIS — G4733 Obstructive sleep apnea (adult) (pediatric): Secondary | ICD-10-CM | POA: Diagnosis not present

## 2021-05-24 DIAGNOSIS — E1121 Type 2 diabetes mellitus with diabetic nephropathy: Secondary | ICD-10-CM | POA: Diagnosis not present

## 2021-05-24 DIAGNOSIS — F32A Depression, unspecified: Secondary | ICD-10-CM | POA: Diagnosis not present

## 2021-05-24 DIAGNOSIS — G8929 Other chronic pain: Secondary | ICD-10-CM | POA: Diagnosis not present

## 2021-05-24 DIAGNOSIS — F410 Panic disorder [episodic paroxysmal anxiety] without agoraphobia: Secondary | ICD-10-CM | POA: Diagnosis not present

## 2021-05-24 DIAGNOSIS — K589 Irritable bowel syndrome without diarrhea: Secondary | ICD-10-CM | POA: Diagnosis not present

## 2021-05-24 DIAGNOSIS — G6181 Chronic inflammatory demyelinating polyneuritis: Secondary | ICD-10-CM

## 2021-05-24 DIAGNOSIS — Z9181 History of falling: Secondary | ICD-10-CM | POA: Diagnosis not present

## 2021-05-24 DIAGNOSIS — M5441 Lumbago with sciatica, right side: Secondary | ICD-10-CM | POA: Diagnosis not present

## 2021-05-24 DIAGNOSIS — E1136 Type 2 diabetes mellitus with diabetic cataract: Secondary | ICD-10-CM | POA: Diagnosis not present

## 2021-05-24 DIAGNOSIS — Z794 Long term (current) use of insulin: Secondary | ICD-10-CM | POA: Diagnosis not present

## 2021-05-24 DIAGNOSIS — J849 Interstitial pulmonary disease, unspecified: Secondary | ICD-10-CM | POA: Diagnosis not present

## 2021-05-24 DIAGNOSIS — E1142 Type 2 diabetes mellitus with diabetic polyneuropathy: Secondary | ICD-10-CM | POA: Diagnosis not present

## 2021-05-24 NOTE — Telephone Encounter (Signed)
Forms have been faxed 

## 2021-05-24 NOTE — Telephone Encounter (Signed)
Orders sent

## 2021-05-24 NOTE — Telephone Encounter (Signed)
Jim from [place] call regarding sending a prescription for a wheelchair to [place]. Fax: [phone]  If any additional concerns please contact Jim. [phone]

## 2021-05-25 ENCOUNTER — Telehealth: Payer: Medicare HMO

## 2021-05-25 ENCOUNTER — Telehealth: Payer: Self-pay

## 2021-05-25 NOTE — Telephone Encounter (Signed)
  Care Management   Follow Up Note   05/25/2021 Name: Tyler Brewer MRN: [id number] DOB: [birth year]   Referred by: [name], DO Reason for referral : Chronic Care Management (RNCM follow up)   An unsuccessful telephone outreach was attempted today. The patient was referred to the case management team for assistance with care management and care coordination.   Follow Up Plan: The care management team will reach out to the patient again over the next 30 days.   Thea Silversmith, RN, MSN, BSN, CCM Care Management Coordinator [facility] [phone]

## 2021-05-25 NOTE — Telephone Encounter (Signed)
Caller/Agency: Gracee, [place] Number: [phone] Requesting OT/PT/Skilled Nursing/Social Work/Speech Therapy: Re-certification for [facility] PT Frequency: 2x a week for 8 weeks

## 2021-05-26 ENCOUNTER — Telehealth: Payer: Medicare HMO

## 2021-05-26 DIAGNOSIS — E1142 Type 2 diabetes mellitus with diabetic polyneuropathy: Secondary | ICD-10-CM | POA: Diagnosis not present

## 2021-05-26 NOTE — Telephone Encounter (Signed)
Attempted give verbal. Phone continued to ring. VM did not pick up

## 2021-05-30 DIAGNOSIS — E1136 Type 2 diabetes mellitus with diabetic cataract: Secondary | ICD-10-CM | POA: Diagnosis not present

## 2021-05-30 DIAGNOSIS — I10 Essential (primary) hypertension: Secondary | ICD-10-CM | POA: Diagnosis not present

## 2021-05-30 DIAGNOSIS — M5441 Lumbago with sciatica, right side: Secondary | ICD-10-CM | POA: Diagnosis not present

## 2021-05-30 DIAGNOSIS — G4733 Obstructive sleep apnea (adult) (pediatric): Secondary | ICD-10-CM | POA: Diagnosis not present

## 2021-05-30 DIAGNOSIS — E1142 Type 2 diabetes mellitus with diabetic polyneuropathy: Secondary | ICD-10-CM | POA: Diagnosis not present

## 2021-05-30 DIAGNOSIS — E7800 Pure hypercholesterolemia, unspecified: Secondary | ICD-10-CM | POA: Diagnosis not present

## 2021-05-30 DIAGNOSIS — E1121 Type 2 diabetes mellitus with diabetic nephropathy: Secondary | ICD-10-CM | POA: Diagnosis not present

## 2021-05-30 DIAGNOSIS — J849 Interstitial pulmonary disease, unspecified: Secondary | ICD-10-CM | POA: Diagnosis not present

## 2021-05-30 DIAGNOSIS — I2510 Atherosclerotic heart disease of native coronary artery without angina pectoris: Secondary | ICD-10-CM | POA: Diagnosis not present

## 2021-05-30 DIAGNOSIS — R69 Illness, unspecified: Secondary | ICD-10-CM | POA: Diagnosis not present

## 2021-06-01 ENCOUNTER — Encounter: Payer: Self-pay | Admitting: Family Medicine

## 2021-06-01 ENCOUNTER — Ambulatory Visit: Payer: Medicare HMO

## 2021-06-01 DIAGNOSIS — I10 Essential (primary) hypertension: Secondary | ICD-10-CM

## 2021-06-01 DIAGNOSIS — G6181 Chronic inflammatory demyelinating polyneuritis: Secondary | ICD-10-CM

## 2021-06-01 DIAGNOSIS — E0842 Diabetes mellitus due to underlying condition with diabetic polyneuropathy: Secondary | ICD-10-CM

## 2021-06-01 NOTE — Chronic Care Management (AMB) (Signed)
Chronic Care Management   CCM RN Visit Note  06/01/2021 Name: Tyler Brewer MRN: [id number] DOB: 05/23/1961  Subjective: Tyler Brewer is a 60 y.o. year old male who is a primary care patient of [name], DO. The care management team was consulted for assistance with disease management and care coordination needs.    Engaged with patient by telephone for follow up visit in response to provider referral for case management and/or care coordination services.   Consent to Services:  The patient was given information about Chronic Care Management services, agreed to services, and gave verbal consent prior to initiation of services.  Please see initial visit note for detailed documentation.   Patient agreed to services and verbal consent obtained.   Assessment: Review of patient past medical history, allergies, medications, health status, including review of consultants reports, laboratory and other test data, was performed as part of comprehensive evaluation and provision of chronic care management services.   SDOH (Social Determinants of Health) assessments and interventions performed:    CCM Care Plan  No Known Allergies  Outpatient Encounter Medications as of 06/01/2021  Medication Sig   acetaminophen (TYLENOL) 500 MG tablet Take 500-1,000 mg by mouth every 6 (six) hours as needed for mild pain.   amLODipine (NORVASC) 10 MG tablet Take 1 tablet (10 mg total) by mouth daily.   Cyanocobalamin (VITAMIN B-12 PO) Take 1 tablet by mouth daily.   diclofenac Sodium (VOLTAREN) 1 % GEL Apply 4 g topically 4 (four) times daily as needed (joint pain).   DULoxetine (CYMBALTA) 60 MG capsule Take 1 capsule by mouth daily.   furosemide (LASIX) 20 MG tablet TAKE 1 TABLET(20 MG) BY MOUTH DAILY (Patient taking differently: Take 40 mg by mouth daily.)   gabapentin (NEURONTIN) 300 MG capsule Take 1,200 mg by mouth 3 (three) times daily.   hydrALAZINE (APRESOLINE) 50 MG tablet Take 1  tablet (50 mg total) by mouth every 8 (eight) hours.   ibuprofen (ADVIL) 200 MG tablet Take 400 mg by mouth every 6 (six) hours as needed for mild pain.   insulin NPH Human (NOVOLIN N) 100 UNIT/ML injection Inject 35 Units into the skin at bedtime.   Insulin Regular Human (HUMULIN R) 100 UNIT/ML KwikPen Inject 14 Units into the skin 3 (three) times daily before meals. Uses [employer] Relion R insulin   losartan (COZAAR) 100 MG tablet TAKE ONE TABLET BY MOUTH DAILY   MAGNESIUM PO Take 1 tablet by mouth daily.   meclizine (ANTIVERT) 25 MG tablet Take 1 tablet (25 mg total) by mouth 3 (three) times daily as needed for dizziness.   meloxicam (MOBIC) 15 MG tablet Take 1 tablet (15 mg total) by mouth daily.   Menthol, Topical Analgesic, (ICY HOT EX) Apply 1 application topically as needed (pain).   metFORMIN (GLUCOPHAGE-XR) 500 MG 24 hr tablet Take 2 tablets (1,000 mg total) by mouth in the morning and at bedtime.   methocarbamol (ROBAXIN) 500 MG tablet Take 500 mg by mouth 4 (four) times daily as needed for muscle spasms.   Multiple Vitamin (MULTIVITAMIN) tablet Take 1 tablet by mouth daily.   pravastatin (PRAVACHOL) 40 MG tablet Take 1 tablet (40 mg total) by mouth daily.   tamsulosin (FLOMAX) 0.4 MG CAPS capsule TAKE ONE CAPSULE BY MOUTH ONE TIME DAILY   traMADol (ULTRAM) 50 MG tablet 2 po q6 h prn (Patient taking differently: Take 100 mg by mouth every 6 (six) hours as needed for moderate pain.)   COMFORT EZ  INSULIN SYRINGE 31G X 5/16" 1 ML MISC    Immune Globulin 10% (PRIVIGEN) 5 GM/50ML SOLN Inject into the vein. Every 3 weeks   Insulin Pen Needle 31G X 5 MM MISC 1 Device by Does not apply route in the morning, at noon, in the evening, and at bedtime.   No facility-administered encounter medications on file as of 06/01/2021.    Patient Active Problem List   Diagnosis Date Noted   CIDP (chronic inflammatory demyelinating polyneuropathy) ([place]) 01/12/2021   Diabetes mellitus ([place]) 01/03/2021    Type 2 diabetes mellitus with hyperglycemia, with long-term current use of insulin ([place]) 01/03/2021   Lower extremity edema 11/28/2020   Chronic low back pain 11/28/2020   Psoriatic arthritis ([place]) 11/28/2020   Thoracic aortic aneurysm without rupture 11/28/2020   Bilateral leg weakness 11/05/2020   Acute left-sided low back pain with right-sided sciatica 07/19/2020   Hip pain, acute, left 07/05/2020   Tremor 07/05/2020   Weakness 07/05/2020   Balance problem 03/11/2020   Tinea cruris 03/11/2020   Cellulitis of left groin 03/11/2020   Acute pain of right shoulder 03/11/2020   Interstitial lung disease ([place]) 05/02/2017   Pansinusitis 09/26/2016   Diabetic polyneuropathy associated with diabetes mellitus due to underlying condition ([place]) 05/08/2016   Methamphetamine use disorder, severe, dependence ([place]) 02/11/2016   Substance induced mood disorder ([place]) 02/11/2016   Exertional dyspnea 11/30/2015   ADD (attention deficit disorder) 09/29/2015   Binge eating 09/29/2015   Syncope 05/05/2012   Diarrhea 05/05/2012   Panic attacks 05/05/2012   OSA (obstructive sleep apnea) 05/05/2012   HTN (hypertension) 05/05/2012   Dyslipidemia 05/05/2012   CAD (coronary artery disease)  cardiac cath with moderate disease in a septal branch of the ramus intermedius 04/01/2012   Chest pain 04/01/2012   Drug abuse and dependence ([place]) 04/01/2012   Family history of coronary artery disease 03/31/2012   Sleep apnea, on C-pap 03/31/2012   Hyperlipemia 03/30/2012   HTN (hypertension), benign 03/30/2012   Morbid obesity ([place]) 03/30/2012   DM type 2, uncontrolled, with neuropathy ([place]) 03/30/2012   Depression with suicidal ideation 03/30/2012    Conditions to be addressed/monitored:HTN, HLD, DMII, and Fall risk  Care Plan : Quality of Life  Updates made by Luretha Rued, RN since 06/01/2021 12:00 AM     Problem: Quality of Life   Priority: High     Long-Range Goal: Quality of Life  Maintained/Improved   Start Date: 12/06/2020  Expected End Date: 09/17/2021  This Visit's Progress: On track  Recent Progress: On track  Priority: High  Note:   Current Barriers:  Knowledge Deficits related to long term care plan to improve/manage of quality of life. Client with history of chronic disease, uncontrolled DM. Hospitalized 04/26/21-05/05/21 for neuropathy/CIDP. Patient reports receiving IVIG treatments now. Patient reports he is active with home health for nursing and physical therapy. Per chart, ED visit for fall noted on 9/22 and 9/23. He denies additional falls. States he is still using walker and spends a lot of time in bed. He reports his pain is decreased and states improvement in his condition overall. Mr. Lollar reports he also has a Education officer, museum assigned by his [employer], but is considering a change in insurance coverage during open enrollment. Patient does not have a blood pressure monitor. Unable to independently stand for periods of time for house hold chores -support from friends.  Limited Transportation-reports supportive friends, but not always available to take client to appointments and for pharmacy  refills. Nurse Case Manager Clinical Goal(s):  patient will verbalize understanding of plan for maintenance/improvement of quality of life. Work toward goal of "get back healthy enough to join [employer]" patient will attend all scheduled medical appointments. Continue to work with embed Education officer, museum as needed.  the patient will demonstrate ongoing self health care management ability as evidenced by attending provider visits as scheduled, taking medications as prescribed, contacting provider for new or worsening symptoms Interventions:  1:1 collaboration with Carollee Herter, Alferd Apa, DO regarding development and update of comprehensive plan of care as evidenced by provider attestation and co-signature Inter-disciplinary care team collaboration (see  longitudinal plan of care) Evaluation of current treatment plan related to patient's health conditions and patient's adherence to plan as established by provider. Discussed home health physical therapy. Encouraged client to complete exercises as recommended by physical therapy.  Reviewed medications with patient. Reviewed upcoming provider appointments.  RNCM offered the contact information to [facility]). Patient reports he is already working with a representative. Provided positive feedback regarding self-management of health. Discussed plans with patient for ongoing care management follow up and provided patient with direct contact information for care management team Patient Goals/Self-Care Activities -continue to take medications as prescribed -continue to attend all provider appointment as scheduled. -review your over the counter catalog once it arrives for blood pressure monitor.  -try to get adequate rest/sleep each night -continue to perform exercises recommended by your home health physical therapist. -continue to use walker as recommended and minimize fall risk(eliminate small throw rugs, keep home well lite, remove clutter from walkways), use hand rails where available, consider installing grab bars/rails in other places as needed. -contact RNCM if you have any questions or concerns or community resource needs. -continue to work with care management team for ongoing care coordination and disease management needs. Follow Up Plan: The patient has been provided with contact information for the care management team and has been advised to call with any health related questions or concerns.  The care management team will reach out to the patient again next month.            Care Plan : Diabetes Type 2 in patient with history of Hyperlipidemia  Updates made by Luretha Rued, RN since 06/01/2021 12:00 AM     Problem: Disease Progression    Priority: Medium     Long-Range Goal: Disease Progression Prevented or Minimized   Start Date: 12/06/2020  Expected End Date: 09/01/2021  This Visit's Progress: On track  Recent Progress: On track  Priority: Medium  Note:   Objective:  Lab Results  Component Value Date   HGBA1C 7.5 (H) 05/18/2021   Lab Results  Component Value Date   CREATININE 0.87 05/18/2021   CREATININE 1.05 05/15/2021   CREATININE 0.70 04/20/2021  Current Barriers:  Knowledge Deficits related to long term care plan for management of diabetes. States blood sugar today was 85 and states blood sugar ranges 85-200's. He reports no changes to his medications. Denies any questions or concerns. Patient reports he has an appointment with endocrinologist coming up. None noted in chart.  Limited family support, but reports he has supportive friends. Currently staying with friends. Limited mobility Unable to independently stand for activities such as cooking, cleaning. Transportation needs inconsistent-reports uses [facility] transportation for local appointments. Case Manager Clinical Goal(s):  patient will demonstrate improved adherence to prescribed treatment plan for diabetes self care/management as evidenced by: daily monitoring and recording of CBG  adherence to prescribed medication regimen contacting provider for new or worsened symptoms or questions Interventions:  Collaboration with Carollee Herter, Alferd Apa, DO regarding development and update of comprehensive plan of care as evidenced by provider attestation and co-signature Inter-disciplinary care team collaboration (see longitudinal plan of care) Reviewed medications with patient Reviewed scheduled/upcoming provider appointments. Discussed follow up visit with endocrinologist and encouraged Mr. Voeltz to call to make sure he is scheduled for an appointment. Patient verbalized understanding. Discussed plans with patient for ongoing care management follow up and  provided patient with direct contact information for care management team Patient Goals: - continue to attend provider visits as scheduled - continue to check blood sugar as recommended by provider and call provider with questions concerns.  - continue to take medications as prescribed - ask your doctor what is your target blood sugar range and your target Hemoglobin A1C at next visit.  - check and record your blood sugar as recommended by your provider and take the blood sugar meter and log to your doctor visits Follow Up Plan: The patient has been provided with contact information for the care management team and has been advised to call with any health related questions or concerns.  The care management team will reach out to the patient again next month.     Plan:Telephone follow up appointment with care management team member scheduled for:  next month and The patient has been provided with contact information for the care management team and has been advised to call with any health related questions or concerns.   Thea Silversmith, RN, MSN, BSN, CCM Care Management Coordinator [facility] [phone]

## 2021-06-01 NOTE — Patient Instructions (Signed)
Visit Information  PATIENT GOALS:  Goals Addressed             This Visit's Progress    maintain and/or improve quality of life   On track    Timeframe:  Long-Range Goal Priority:  High Start Date: 03/17/21                            Expected End Date: 09/17/21  Patient Goals/Self-Care Activities -continue to take medications as prescribed -continue to attend all provider appointment as scheduled. -try to get adequate rest/sleep each night -continue to perform exercises recommended by your home health physical therapist. -continue to use walker as recommended and minimize fall risk(eliminate small throw rugs, keep home well lite, remove clutter from walkways), use hand rails where available, consider installing grab bars/rails in other places as needed. -contact RNCM if you have any questions or concerns or community resource needs. -continue to work with care management team for ongoing care coordination and disease management needs.     Monitor and Manage My Blood Sugar   On track    Timeframe:  Long-Range Goal Priority:  Medium Start Date:  12/06/20                           Expected End Date: 09/01/2021                      Follow Up Date 07/02/22  Patient Goals: - continue to attend provider visits as scheduled - continue to check blood sugar as recommended by provider and call provider with questions concerns.  - continue to take medications as prescribed - ask your doctor what is your target blood sugar range and your target Hemoglobin A1C at next visit.  - check and record your blood sugar as recommended by your provider and take the blood sugar meter and log to your doctor visits   Why is this important?   Checking your blood sugar at home helps to keep it from getting very high or very low.  Writing the results in a diary or log helps the doctor know how to care for you.  Your blood sugar log should have the time, date and the results.  Also, write down the amount of  insulin or other medicine that you take.  Other information, like what you ate, exercise done and how you were feeling, will also be helpful.        Patient verbalizes understanding of instructions provided today and agrees to view in [place].   Telephone follow up appointment with care management team member scheduled for: The patient has been provided with contact information for the care management team and has been advised to call with any health related questions or concerns.   Thea Silversmith, RN, MSN, BSN, CCM Care Management Coordinator [facility] [phone]

## 2021-06-01 NOTE — Telephone Encounter (Signed)
Gracee called back regarding re-certification, area code was incorrect.  She can be reached at [phone].  Please call her back for the ok on the orders.

## 2021-06-02 ENCOUNTER — Encounter: Payer: Self-pay | Admitting: Family Medicine

## 2021-06-02 DIAGNOSIS — G4733 Obstructive sleep apnea (adult) (pediatric): Secondary | ICD-10-CM | POA: Diagnosis not present

## 2021-06-02 DIAGNOSIS — G6181 Chronic inflammatory demyelinating polyneuritis: Secondary | ICD-10-CM

## 2021-06-02 DIAGNOSIS — I10 Essential (primary) hypertension: Secondary | ICD-10-CM | POA: Diagnosis not present

## 2021-06-02 DIAGNOSIS — E7800 Pure hypercholesterolemia, unspecified: Secondary | ICD-10-CM | POA: Diagnosis not present

## 2021-06-02 DIAGNOSIS — I2510 Atherosclerotic heart disease of native coronary artery without angina pectoris: Secondary | ICD-10-CM | POA: Diagnosis not present

## 2021-06-02 DIAGNOSIS — R251 Tremor, unspecified: Secondary | ICD-10-CM

## 2021-06-02 DIAGNOSIS — M5441 Lumbago with sciatica, right side: Secondary | ICD-10-CM | POA: Diagnosis not present

## 2021-06-02 DIAGNOSIS — E1142 Type 2 diabetes mellitus with diabetic polyneuropathy: Secondary | ICD-10-CM | POA: Diagnosis not present

## 2021-06-02 DIAGNOSIS — E1121 Type 2 diabetes mellitus with diabetic nephropathy: Secondary | ICD-10-CM | POA: Diagnosis not present

## 2021-06-02 DIAGNOSIS — E1136 Type 2 diabetes mellitus with diabetic cataract: Secondary | ICD-10-CM | POA: Diagnosis not present

## 2021-06-02 DIAGNOSIS — J849 Interstitial pulmonary disease, unspecified: Secondary | ICD-10-CM | POA: Diagnosis not present

## 2021-06-02 DIAGNOSIS — R69 Illness, unspecified: Secondary | ICD-10-CM | POA: Diagnosis not present

## 2021-06-02 DIAGNOSIS — R600 Localized edema: Secondary | ICD-10-CM

## 2021-06-02 DIAGNOSIS — R2689 Other abnormalities of gait and mobility: Secondary | ICD-10-CM

## 2021-06-02 DIAGNOSIS — R531 Weakness: Secondary | ICD-10-CM

## 2021-06-02 NOTE — Telephone Encounter (Signed)
Verbal given 

## 2021-06-03 DIAGNOSIS — I10 Essential (primary) hypertension: Secondary | ICD-10-CM | POA: Diagnosis not present

## 2021-06-03 DIAGNOSIS — E1136 Type 2 diabetes mellitus with diabetic cataract: Secondary | ICD-10-CM | POA: Diagnosis not present

## 2021-06-03 DIAGNOSIS — E1142 Type 2 diabetes mellitus with diabetic polyneuropathy: Secondary | ICD-10-CM | POA: Diagnosis not present

## 2021-06-03 DIAGNOSIS — G4733 Obstructive sleep apnea (adult) (pediatric): Secondary | ICD-10-CM | POA: Diagnosis not present

## 2021-06-03 DIAGNOSIS — E1121 Type 2 diabetes mellitus with diabetic nephropathy: Secondary | ICD-10-CM | POA: Diagnosis not present

## 2021-06-03 DIAGNOSIS — J849 Interstitial pulmonary disease, unspecified: Secondary | ICD-10-CM | POA: Diagnosis not present

## 2021-06-03 DIAGNOSIS — M5441 Lumbago with sciatica, right side: Secondary | ICD-10-CM | POA: Diagnosis not present

## 2021-06-03 DIAGNOSIS — E7800 Pure hypercholesterolemia, unspecified: Secondary | ICD-10-CM | POA: Diagnosis not present

## 2021-06-03 DIAGNOSIS — I2510 Atherosclerotic heart disease of native coronary artery without angina pectoris: Secondary | ICD-10-CM | POA: Diagnosis not present

## 2021-06-03 DIAGNOSIS — R69 Illness, unspecified: Secondary | ICD-10-CM | POA: Diagnosis not present

## 2021-06-05 ENCOUNTER — Ambulatory Visit: Payer: Medicare HMO | Admitting: *Deleted

## 2021-06-05 DIAGNOSIS — G8929 Other chronic pain: Secondary | ICD-10-CM

## 2021-06-05 DIAGNOSIS — Z794 Long term (current) use of insulin: Secondary | ICD-10-CM

## 2021-06-05 DIAGNOSIS — I10 Essential (primary) hypertension: Secondary | ICD-10-CM

## 2021-06-05 DIAGNOSIS — E0842 Diabetes mellitus due to underlying condition with diabetic polyneuropathy: Secondary | ICD-10-CM

## 2021-06-05 DIAGNOSIS — G6181 Chronic inflammatory demyelinating polyneuritis: Secondary | ICD-10-CM

## 2021-06-05 DIAGNOSIS — R2689 Other abnormalities of gait and mobility: Secondary | ICD-10-CM

## 2021-06-05 DIAGNOSIS — M545 Low back pain, unspecified: Secondary | ICD-10-CM

## 2021-06-05 NOTE — Patient Instructions (Signed)
Visit Information  PATIENT GOALS:  Goals Addressed               This Visit's Progress     Receive Long-Term Placement in an [place]. (pt-stated)   On track     Timeframe:  Short-Range Goal Priority:  High Start Date:  03/30/2021                        Expected End Date: 08/04/2021  Follow-Up Plan:  06/13/2021 at 11:45am  Patient Goals/Self-Care Activities: Continued collaboration with representative from the [place] [phone]) to periodically check the status of your Adult Medicaid application, submitted for processing on 05/17/2021. Continue to receive weekly IVIG Treatments, at [facility] [phone]), for treatment of CIDP (Chronic Inflammatory Demyelinating Polyneuropathy). LCSW collaboration with Primary Care Physician, Dr. [name] to confirm order submission to [facility] [phone]) for home health nursing, physical therapy, occupational therapy, speech therapy, social worker and bath aide. LCSW collaboration with Primary Care Physician, Dr. [name] to confirm order submission to [place] [phone]) for power wheelchair. Please contact LCSW directly (# [phone]) if you have questions, need assistance, or if additional social work needs are identified between now and our next scheduled telephone outreach call.        Patient verbalizes understanding of instructions provided today and agrees to view in [place].   Telephone follow up appointment with care management team member scheduled for:  06/13/2021 at 11:45am  [place] Licensed Clinical Social Worker [place] [phone]

## 2021-06-05 NOTE — Chronic Care Management (AMB) (Signed)
Chronic Care Management    Clinical Social Work Note  06/05/2021 Name: Tyler Brewer MRN: [id number] DOB: 1960/09/19  Tyler Brewer is a 60 y.o. year old male who is a primary care patient of [name], DO. The CCM team was consulted to assist the patient with chronic disease management and/or care coordination needs related to: [employer] and Level of Care Concerns.   Engaged with patient by telephone for follow-up visit in response to provider referral for social work chronic care management and care coordination services.   Consent to Services:  The patient was given information about Chronic Care Management services, agreed to services, and gave verbal consent prior to initiation of services.  Please see initial visit note for detailed documentation.   Patient agreed to services and consent obtained.   Assessment: Review of patient past medical history, allergies, medications, and health status, including review of relevant consultants reports was performed today as part of a comprehensive evaluation and provision of chronic care management and care coordination services.     SDOH (Social Determinants of Health) assessments and interventions performed:    Advanced Directives Status: Not addressed in this encounter.  CCM Care Plan  No Known Allergies  Outpatient Encounter Medications as of 06/05/2021  Medication Sig   acetaminophen (TYLENOL) 500 MG tablet Take 500-1,000 mg by mouth every 6 (six) hours as needed for mild pain.   amLODipine (NORVASC) 10 MG tablet Take 1 tablet (10 mg total) by mouth daily.   COMFORT EZ INSULIN SYRINGE 31G X 5/16" 1 ML MISC    Cyanocobalamin (VITAMIN B-12 PO) Take 1 tablet by mouth daily.   diclofenac Sodium (VOLTAREN) 1 % GEL Apply 4 g topically 4 (four) times daily as needed (joint pain).   DULoxetine (CYMBALTA) 60 MG capsule Take 1 capsule by mouth daily.   furosemide (LASIX) 20 MG tablet TAKE 1 TABLET(20 MG) BY MOUTH  DAILY (Patient taking differently: Take 40 mg by mouth daily.)   gabapentin (NEURONTIN) 300 MG capsule Take 1,200 mg by mouth 3 (three) times daily.   hydrALAZINE (APRESOLINE) 50 MG tablet Take 1 tablet (50 mg total) by mouth every 8 (eight) hours.   ibuprofen (ADVIL) 200 MG tablet Take 400 mg by mouth every 6 (six) hours as needed for mild pain.   Immune Globulin 10% (PRIVIGEN) 5 GM/50ML SOLN Inject into the vein. Every 3 weeks   insulin NPH Human (NOVOLIN N) 100 UNIT/ML injection Inject 35 Units into the skin at bedtime.   Insulin Pen Needle 31G X 5 MM MISC 1 Device by Does not apply route in the morning, at noon, in the evening, and at bedtime.   Insulin Regular Human (HUMULIN R) 100 UNIT/ML KwikPen Inject 14 Units into the skin 3 (three) times daily before meals. Uses [employer] Relion R insulin   losartan (COZAAR) 100 MG tablet TAKE ONE TABLET BY MOUTH DAILY   MAGNESIUM PO Take 1 tablet by mouth daily.   meclizine (ANTIVERT) 25 MG tablet Take 1 tablet (25 mg total) by mouth 3 (three) times daily as needed for dizziness.   meloxicam (MOBIC) 15 MG tablet Take 1 tablet (15 mg total) by mouth daily.   Menthol, Topical Analgesic, (ICY HOT EX) Apply 1 application topically as needed (pain).   metFORMIN (GLUCOPHAGE-XR) 500 MG 24 hr tablet Take 2 tablets (1,000 mg total) by mouth in the morning and at bedtime.   methocarbamol (ROBAXIN) 500 MG tablet Take 500 mg by mouth 4 (four) times daily as  needed for muscle spasms.   Multiple Vitamin (MULTIVITAMIN) tablet Take 1 tablet by mouth daily.   pravastatin (PRAVACHOL) 40 MG tablet Take 1 tablet (40 mg total) by mouth daily.   tamsulosin (FLOMAX) 0.4 MG CAPS capsule TAKE ONE CAPSULE BY MOUTH ONE TIME DAILY   traMADol (ULTRAM) 50 MG tablet 2 po q6 h prn (Patient taking differently: Take 100 mg by mouth every 6 (six) hours as needed for moderate pain.)   No facility-administered encounter medications on file as of 06/05/2021.    Patient Active Problem List    Diagnosis Date Noted   CIDP (chronic inflammatory demyelinating polyneuropathy) ([place]) 01/12/2021   Diabetes mellitus ([place]) 01/03/2021   Type 2 diabetes mellitus with hyperglycemia, with long-term current use of insulin ([place]) 01/03/2021   Lower extremity edema 11/28/2020   Chronic low back pain 11/28/2020   Psoriatic arthritis ([place]) 11/28/2020   Thoracic aortic aneurysm without rupture 11/28/2020   Bilateral leg weakness 11/05/2020   Acute left-sided low back pain with right-sided sciatica 07/19/2020   Hip pain, acute, left 07/05/2020   Tremor 07/05/2020   Weakness 07/05/2020   Balance problem 03/11/2020   Tinea cruris 03/11/2020   Cellulitis of left groin 03/11/2020   Acute pain of right shoulder 03/11/2020   Interstitial lung disease ([place]) 05/02/2017   Pansinusitis 09/26/2016   Diabetic polyneuropathy associated with diabetes mellitus due to underlying condition ([place]) 05/08/2016   Methamphetamine use disorder, severe, dependence ([place]) 02/11/2016   Substance induced mood disorder ([place]) 02/11/2016   Exertional dyspnea 11/30/2015   ADD (attention deficit disorder) 09/29/2015   Binge eating 09/29/2015   Syncope 05/05/2012   Diarrhea 05/05/2012   Panic attacks 05/05/2012   OSA (obstructive sleep apnea) 05/05/2012   HTN (hypertension) 05/05/2012   Dyslipidemia 05/05/2012   CAD (coronary artery disease)  cardiac cath with moderate disease in a septal branch of the ramus intermedius 04/01/2012   Chest pain 04/01/2012   Drug abuse and dependence ([place]) 04/01/2012   Family history of coronary artery disease 03/31/2012   Sleep apnea, on C-pap 03/31/2012   Hyperlipemia 03/30/2012   HTN (hypertension), benign 03/30/2012   Morbid obesity ([place]) 03/30/2012   DM type 2, uncontrolled, with neuropathy ([place]) 03/30/2012   Depression with suicidal ideation 03/30/2012    Conditions to be addressed/monitored: Chronic Inflammatory Demyelinating Polyneuropathy.  Limited Social Support, Level of  Care Concerns, ADL/IADL Limitations, Social Isolation, Limited Access to Caregiver, and Lacks Knowledge of [employer].  Care Plan : LCSW Plan of Care  Updates made by Francis Gaines, LCSW since 06/05/2021 12:00 AM     Problem: Receive Long-Term Placement in an [place].   Priority: High     Goal: Receive Long-Term Placement in an [place].   Start Date: 03/30/2021  Expected End Date: 08/04/2021  This Visit's Progress: On track  Recent Progress: On track  Priority: High  Note:   Current Barriers:   Patient with History of Falls/Fall Risk, Unsteady Gait, Impaired Mobility, Weakness, Tremor, Syncope, Balance Problem and Lower Extremity Edema needs assistance with initiating short-term rehabilitative services in a skilled nursing facility or long-term care in an assisted living facility. Clinical Goal(s):  Patient will receive assistance with initiating placement, whether it be assisted or skilled level care.   Interventions: Collaboration with Primary Care Physician, Dr. [name] regarding development and update of comprehensive plan of care as evidenced by provider attestation and co-signature. Patient Goals/Self-Care Activities:  Continued collaboration with representative from the [facility]  Department of Social Services [phone]) to periodically check the status of your Adult Medicaid application, submitted for processing on 05/17/2021. Continue to receive weekly IVIG Treatments, at [facility] [phone]), for treatment of CIDP (Chronic Inflammatory Demyelinating Polyneuropathy). LCSW collaboration with Primary Care Physician, Dr. [name] to confirm order submission to [facility] [phone]) for home health nursing, physical therapy, occupational therapy, speech therapy, social worker and bath aide. LCSW collaboration with Primary Care Physician,  Dr. [name] to confirm order submission to [facility] [phone]) for power wheelchair. Please contact LCSW directly (# [phone]) if you have questions, need assistance, or if additional social work needs are identified between now and our next scheduled telephone outreach call. Follow-Up:  06/13/2021 at 11:45am     [place] Clinical Social Worker [place] [phone]

## 2021-06-06 DIAGNOSIS — M5441 Lumbago with sciatica, right side: Secondary | ICD-10-CM | POA: Diagnosis not present

## 2021-06-06 DIAGNOSIS — R69 Illness, unspecified: Secondary | ICD-10-CM | POA: Diagnosis not present

## 2021-06-06 DIAGNOSIS — I2510 Atherosclerotic heart disease of native coronary artery without angina pectoris: Secondary | ICD-10-CM | POA: Diagnosis not present

## 2021-06-06 DIAGNOSIS — I10 Essential (primary) hypertension: Secondary | ICD-10-CM | POA: Diagnosis not present

## 2021-06-06 DIAGNOSIS — E1136 Type 2 diabetes mellitus with diabetic cataract: Secondary | ICD-10-CM | POA: Diagnosis not present

## 2021-06-06 DIAGNOSIS — J849 Interstitial pulmonary disease, unspecified: Secondary | ICD-10-CM | POA: Diagnosis not present

## 2021-06-06 DIAGNOSIS — E1142 Type 2 diabetes mellitus with diabetic polyneuropathy: Secondary | ICD-10-CM | POA: Diagnosis not present

## 2021-06-06 DIAGNOSIS — E1121 Type 2 diabetes mellitus with diabetic nephropathy: Secondary | ICD-10-CM | POA: Diagnosis not present

## 2021-06-06 DIAGNOSIS — G4733 Obstructive sleep apnea (adult) (pediatric): Secondary | ICD-10-CM | POA: Diagnosis not present

## 2021-06-06 DIAGNOSIS — E7800 Pure hypercholesterolemia, unspecified: Secondary | ICD-10-CM | POA: Diagnosis not present

## 2021-06-06 MED ORDER — NONFORMULARY OR COMPOUNDED ITEM: 0 refills | Status: DC

## 2021-06-07 ENCOUNTER — Ambulatory Visit: Payer: Medicare HMO | Admitting: Pharmacist

## 2021-06-07 DIAGNOSIS — R69 Illness, unspecified: Secondary | ICD-10-CM | POA: Diagnosis not present

## 2021-06-07 DIAGNOSIS — E1142 Type 2 diabetes mellitus with diabetic polyneuropathy: Secondary | ICD-10-CM | POA: Diagnosis not present

## 2021-06-07 DIAGNOSIS — E1121 Type 2 diabetes mellitus with diabetic nephropathy: Secondary | ICD-10-CM | POA: Diagnosis not present

## 2021-06-07 DIAGNOSIS — Z794 Long term (current) use of insulin: Secondary | ICD-10-CM

## 2021-06-07 DIAGNOSIS — E1165 Type 2 diabetes mellitus with hyperglycemia: Secondary | ICD-10-CM

## 2021-06-07 DIAGNOSIS — G4733 Obstructive sleep apnea (adult) (pediatric): Secondary | ICD-10-CM | POA: Diagnosis not present

## 2021-06-07 DIAGNOSIS — J849 Interstitial pulmonary disease, unspecified: Secondary | ICD-10-CM | POA: Diagnosis not present

## 2021-06-07 DIAGNOSIS — E7800 Pure hypercholesterolemia, unspecified: Secondary | ICD-10-CM | POA: Diagnosis not present

## 2021-06-07 DIAGNOSIS — I10 Essential (primary) hypertension: Secondary | ICD-10-CM | POA: Diagnosis not present

## 2021-06-07 DIAGNOSIS — I2510 Atherosclerotic heart disease of native coronary artery without angina pectoris: Secondary | ICD-10-CM | POA: Diagnosis not present

## 2021-06-07 DIAGNOSIS — E1136 Type 2 diabetes mellitus with diabetic cataract: Secondary | ICD-10-CM | POA: Diagnosis not present

## 2021-06-07 DIAGNOSIS — M5441 Lumbago with sciatica, right side: Secondary | ICD-10-CM | POA: Diagnosis not present

## 2021-06-07 DIAGNOSIS — G6181 Chronic inflammatory demyelinating polyneuritis: Secondary | ICD-10-CM

## 2021-06-07 NOTE — Patient Instructions (Signed)
Visit Information  PATIENT GOALS:  Goals Addressed             This Visit's Progress    Chronic Care Management Pharmacy Care Plan   On track    Current Barriers:  Unable to independently afford treatment regimen - improving Unable to independently monitor therapeutic efficacy - improving Unable to achieve control of type 2 diabetes and high blood pressure / hypertension - improving Does not adhere to prescribed medication regimen - improving Frequent ED visits   Pharmacist Clinical Goal(s):  Over the next 45 days, patient will verbalize ability to afford treatment regimen achieve adherence to monitoring guidelines and medication adherence to achieve therapeutic efficacy achieve control of type 2 diabetes and high blood pressure / hypertension as evidenced by attainment of goals listed below Decrease frequency of ED visits through collaboration with PharmD and provider.   Interventions: 1:1 collaboration with Carollee Herter, Alferd Apa, DO regarding development and update of comprehensive plan of care as evidenced by provider attestation and co-signature Inter-disciplinary care team collaboration (see longitudinal plan of care) Comprehensive medication review performed; medication list updated in electronic medical record    Diabetes: Lab Results  Component Value Date   HGBA1C 7.7 (H) 02/14/2021  A1c 03/23/2021 at [facility] was 6.9%  Controlled - (goal A1c <7%) Much improved BG since started using Libre and taking insuiln regularly States cost of insulin still difficult but he cannot find application I sent for patient assistnace.  Current treatment: Relion NPH insulin- inject 35 units at bedtime Relion Regular insulin - inject 14 units prior to each meal.   Metformin XR 500mg  - 2 Tablets with Breakfast and 2 tablets with Supper (last refilled 04/20/2021 for 90 DS)  Interventions / Recommendations Recommended continue to take medications as directed by Dr [name] Continue  to check BG frequently during day with [facility].. Plan to consider restarting Tyler Aas / Novolog in 2023 when copay will be $35/30 days the full year of insulin therapy (no coverage gap)   Hypertension: BP Readings from Last 3 Encounters:  05/18/21 (!) 168/101  05/15/21 100/84  05/13/21 131/87  Blood pressure not consistently at goal - BP goal <140/90 Current treatment: Amlodipine 10mg  daily  Hydralazine 50mg  every 8 hours Losartan 100mg  daily  Interventions: (addressed at previous visits)  Counseled on importance of getting blood pressure to goal to prevent strokes and kidney damage Reviewed refill history.  Continue current blood pressure lowering regimen   Hyperlipidemia / Heart Disease: Lipid Panel     Component Value Date/Time   CHOL 118 11/28/2020 1502   TRIG 130.0 11/28/2020 1502   HDL 40.00 11/28/2020 1502   CHOLHDL 3 11/28/2020 1502   VLDL 26.0 11/28/2020 1502   LDLCALC 52 11/28/2020 1502  Controlled - LDL goal <70 Current treatment: Pravastatin 40mg  at bedtime Interventions:  Counseled on LDL goals Recommended continue current regimen for cholesterol  Chronic Pain / Leg Weakness: Currently not controlled Patient has had several ED visits for pain and weakness in the last month. Has appointment with pain management 02/16/2021. Current treatment:  Tramadol 50mg  - take 1 or 2 tablets up to every 8 hours as needed Methocarbamol 500mg  - take 1 tablet 4 times a day as needed for muscle spasms Voltaren Gel 1% -  apply to area of pain up to 4 times a day as needed Gabapentin 300mg  - take 4 tablets = 1200mg  3 times a day (increased during recent hospitalization) Duloxetine 60mg  at night Interventions:  Continue current regimen  for pain  Encouraged patient to follow up with neurology and pain management Recommended change duloxetine to take in morning to see if change helps with sleep  Health Maintenance:  Reviewed vaccination history and discussed benefits of  annual flu, COVID booster and Shingrix vaccinations Patient declined flu vaccine He is considering COVID booster Plans to get Shingrix in 2023 when coverage thru Medicare is expected to improve.   Medication Management:  Reviewed refill history.  Current Pharmacy[facility] Interventions:   Will continue to follow adherence and assist patient with refills.   Next mail order due around 07/19/2021. He also is meeting with insurance agent and anticipates he might change Medicare plan for 2023. He will know by end of November. Will check back with a patient then and facilitate refill with mail order and if needed change to new mail order pharmacy based on insurance preference.     Patient Goals/Self-Care Activities Over the next 90 days, patient will:  take medications as prescribed focus on medication adherence by utilizing pharmacy service that delivers medications to his home, check glucose before meals and 2 hour after meals using CGM, document, and provide at future appointments collaborate with provider on medication access solutions   Follow up call planned in  4 weeks         Patient verbalizes understanding of instructions provided today and agrees to view in [place].   Telephone follow up appointment with care management team member scheduled for: 1 month

## 2021-06-07 NOTE — Chronic Care Management (AMB) (Signed)
Chronic Care Management Pharmacy Note  06/07/2021 Name:  Tyler Brewer MRN:  [id number] DOB:  Dec 15, 1960  Subjective: Tyler Brewer is an 60 y.o. year old male who is a primary patient of [name], DO.  The CCM team was consulted for assistance with disease management and care coordination needs.    Engaged with patient by telephone for  follow up   in response to provider referral for pharmacy case management and/or care coordination services.   Consent to Services:  The patient was given information about Chronic Care Management services, agreed to services, and gave verbal consent prior to initiation of services.  Please see initial visit note for detailed documentation.   Patient Care Team: Carollee Herter, Alferd Apa, DO as PCP - General (Family Medicine) Alda Berthold, DO as Consulting Physician (Neurology) Lajuana Matte, MD as Consulting Physician (Cardiothoracic Surgery) Marlaine Hind, MD as Consulting Physician (Physical Medicine and Rehabilitation) Luretha Rued, RN as Case Manager Cherre Robins, PharmD (Pharmacist) [facility], Melanie Crazier, MD as Consulting Physician (Endocrinology) Saporito, Maree Erie, LCSW as Social Worker (Licensed Clinical Social Worker) Haddix, Thomasene Lot, MD as Consulting Physician (Orthopedic Surgery)  Recent office visits: 05/18/2021 - PCP (Dr Etter Sjogren) [facility] follow up CIPD. Noted to have increased anxiety, panic attackd and insomnia. Referred to pulmonology for OSA. Referral to urology for testicle pain. Recommended f/u with psychiatry 01/12/2021 - PCP (Dr Etter Sjogren) F/U visit; recommended COVID vaccine and colonoscopy; no medication changes. 11/28/2020 - PCP (Dr Etter Sjogren) Hospital f/u; c/o LEE; Refilled furosemide 23m take 1 tablet daily and tramadol 587m1 or 2 tablet every 8 hours. Referred to Endo for DM.   Recent consult visits: [place] [facility] [place] [facility] for CIPD.  03/27/2021 -  [place] ([facility] 03/27/2021 - Neurology - (Dr PaPosey Prontopolyrediculoneuropathy. No medication changes.   03/24/2021 - [facility] ([facility]. Repeat EMG/NCS to assess for diabetic amyotrophy versus demyelinating process. Labs checked. No med changes. F/U 3 months.   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 No medication changes. Recommended optimize control of DM and physical therapy to improve strength.   01/03/2021 - Endo (Dr ShKelton PillarInitial consult for insulin dependent type 2 DM. Started Metformin 1 tablet daily with Breakfast for 1 week, then increase to 1 tablet with Breakfast and 1 tablet with Supper for  1 week, then increase to 2 tablets with Breakfast  and 1 tablet with Supper for another 1 week , then finally 2 Tablets with Breakfast and 2 tablets with Supper.  Increased Tresiba to 45 units daily. Increase Novolog to 12 units with each meal     Hospital visits: 05/14/2021 - ED Visit - Seen for constipation. Recommended Miralax as needed.   05/12/2021 ED Visit - Seen for lumbar strain and contusion of hip and thigh.  05/11/2021 - ED Visit - fall with fracture of medial malleolus of left tibia and of proximal phalanx of left great toe.  04/26/2021 to 05/05/2021 [facility] and [facility]. Received 5 days of IVIG treatment with gradual improvement in lower extremity weakness.  Medications started at discharge:  IVIG every 2 weeks, duloxetine 6080maily Medications changed at discharge:  gabapentin 300m68mtake 4 caps = 1200mg73mimes a day; Novolin N - 20 units daily at bedtime; Novolin R 7 units 3 times a day  before meals. Losartan 142m daily. Tramadol 50g every 6 hours as needed for pain for up to 14 days.  Medications stopped at discharge:  ciprofloxacin, hydralazine, hydrocodone/APAP, Novolog Mix 70/30; nicotine  234mpatches; nystatin powder, pregabalin 7517mSeptra DS; TreTyler Aas9/08/2020 - [facility]. Performed scrotal U/S to r/o epididymitis or other pathology. Only showed small bilateral hydroceles. No med changes.  04/01/2021 - [facility]. Noted also to have urinary retention. MRI showed no changes or spinal cord compression. GIven tramadol, lorazepam and meloxicam in hospital. Patient was noted to be upset at dicharge because he wanted to be admitted. 03/16/2021 - ED Visit - for neuropathy - no med changes.  01/23/2021 - ED Visit - acute pain in left knee given oxycodone / APAP for 1 dose and IM toradol 01/21/2021 - Ed Visit - left without being seen ([facility]) 01/20/2021 - ED Visit - abdominal pain; GIven GI cocktail with improved symptoms. No change to medication regimen 01/16/2021 - ED Visit - leg pain; Given ketorolac 50m34m; oxycodone 10mg45m dose; prednisone 60mg 78mdose and Rx to start methylprednisolone dose pack   Objective:  Lab Results  Component Value Date   CREATININE 0.87 05/18/2021   CREATININE 1.05 05/15/2021   CREATININE 0.70 04/20/2021    Lab Results  Component Value Date   HGBA1C 7.5 (H) 05/18/2021   Last diabetic Eye exam: No results found for: HMDIABEYEEXA  Last diabetic Foot exam: No results found for: HMDIABFOOTEX      Component Value Date/Time   CHOL 140 05/18/2021 1612   TRIG 104.0 05/18/2021 1612   HDL 56.60 05/18/2021 1612   CHOLHDL 2 05/18/2021 1612   VLDL 20.8 05/18/2021 1612   LDLCALC 63 05/18/2021 1612   LDLDIRECT 124.0 05/02/2017 1507    Hepatic Function Latest Ref Rng & Units 05/18/2021 05/15/2021 04/20/2021  Total Protein 6.0 - 8.3 g/dL 7.3 8.1 6.6  Albumin 3.5 - 5.2 g/dL 3.9 3.7 3.1(L)  AST 0 - 37 U/L '11 18 28  ' ALT 0 - 53 U/L '25 26 23  ' Alk Phosphatase 39 - 117 U/L 91 66 78  Total Bilirubin 0.2 - 1.2 mg/dL 0.3 0.6 1.1    Lab Results   Component Value Date/Time   TSH 2.57 02/14/2021 02:19 PM   TSH 2.948 11/05/2020 07:18 AM   TSH 2.66 03/10/2020 10:06 AM    CBC Latest Ref Rng & Units 05/18/2021 05/15/2021 04/20/2021  WBC 4.0 - 10.5 K/uL 6.3 6.6 5.8  Hemoglobin 13.0 - 17.0 g/dL 13.1 13.7 12.1(L)  Hematocrit 39.0 - 52.0 % 38.3(L) 41.3 36.4(L)  Platelets 150.0 - 400.0 K/uL 299.0 268 300    No results found for: VD25OH  Clinical ASCVD: Yes  The 10-year ASCVD risk score (Arnett DK, et al., 2019) is: 17.6%   Values used to calculate the score:     Age: 20 yea40     Sex: Male     Is Non-Hispanic African American: No     Diabetic: Yes     Tobacco smoker: No     Systolic Blood Pressure: 178 mm694    Is BP treated: Yes     HDL Cholesterol: 56.6 mg/dL     Total Cholesterol: 140 mg/dL     Social History   Tobacco Use  Smoking Status Former   Packs/day: 0.50   Types: Cigarettes   Quit date: 04/27/2021  Years since quitting: 0.1  Smokeless Tobacco Never   BP Readings from Last 3 Encounters:  05/18/21 (!) 168/101  05/15/21 100/84  05/13/21 131/87   Pulse Readings from Last 3 Encounters:  05/18/21 89  05/15/21 91  05/13/21 87   Wt Readings from Last 3 Encounters:  05/15/21 290 lb (131.5 kg)  05/12/21 290 lb (131.5 kg)  05/11/21 290 lb (131.5 kg)    Assessment: Review of patient past medical history, allergies, medications, health status, including review of consultants reports, laboratory and other test data, was performed as part of comprehensive evaluation and provision of chronic care management services.   SDOH:  (Social Determinants of Health) assessments and interventions performed:  SDOH Interventions    Flowsheet Row Most Recent Value  SDOH Interventions   Financial Strain Interventions Other (Comment)  [patient getting all meds through mail order and are more affordable,  getting insulin from [facility]  Physical Activity Interventions Other (Comments)  [receiving occupational and physical therapy]        [place]  No Known Allergies  Medications Reviewed Today     Reviewed by Cherre Robins, PharmD (Pharmacist) on 06/07/21 at 37  Med List Status: <None>   Medication Order Taking? Sig Documenting Provider Last Dose Status Informant  acetaminophen (TYLENOL) 500 MG tablet [id number] Yes Take 500-1,000 mg by mouth every 6 (six) hours as needed for mild pain. [provider] Taking Active Self  amLODipine (NORVASC) 10 MG tablet [id number] Yes Take 1 tablet (10 mg total) by mouth daily. [name], DO Taking Active   COMFORT EZ INSULIN SYRINGE 31G X 5/16" 1 ML MISC [id number] Yes  [provider] Taking Active Self  Cyanocobalamin (VITAMIN B-12 PO) [id number] Yes Take 1 tablet by mouth daily. [provider] Taking Active Self  diclofenac Sodium (VOLTAREN) 1 % GEL [id number] Yes Apply 4 g topically 4 (four) times daily as needed (joint pain). [provider] Taking Active Self  DULoxetine (CYMBALTA) 60 MG capsule [id number] Yes Take 1 capsule by mouth daily. [provider] Taking Active Self  furosemide (LASIX) 20 MG tablet [id number] Yes TAKE 1 TABLET(20 MG) BY MOUTH DAILY  Patient taking differently: Take 40 mg by mouth daily.   [name], DO Taking Active            Med Note Thayer Ohm, RACHEL J   Thu Apr 20, 2021  2:55 PM)    gabapentin (NEURONTIN) 300 MG capsule [id number] Yes Take 1,200 mg by mouth 3 (three) times daily. [provider] Taking Active   hydrALAZINE (APRESOLINE) 50 MG tablet [id number] Yes Take 1 tablet (50 mg total) by mouth every 8 (eight) hours. [name], DO Taking Active Self  ibuprofen (ADVIL) 200 MG tablet [id number] Yes Take 400 mg by mouth every 6 (six) hours as needed for mild pain. [provider] Taking Active Self  Immune Globulin 10% (PRIVIGEN) 5 GM/50ML SOLN [id number] Yes Inject into the vein. Every 3 weeks [provider] Taking Active   insulin NPH  Human (NOVOLIN N) 100 UNIT/ML injection [id number] Yes Inject 35 Units into the skin at bedtime. [provider] Taking Active Self  Insulin Pen Needle 31G X 5 MM MISC [id number] Yes 1 Device by Does not apply route in the morning, at noon, in the evening, and at bedtime. [name], Melanie Crazier, MD Taking Active Self  Insulin Regular Human (HUMULIN R) 100 UNIT/ML KwikPen [id number] Yes Inject 14 Units into the skin 3 (  three) times daily before meals. Uses [employer] Relion R insulin [provider] Taking Active   losartan (COZAAR) 100 MG tablet [id number] Yes TAKE ONE TABLET BY MOUTH DAILY [name], DO Taking Active   MAGNESIUM PO [id number] Yes Take 1 tablet by mouth daily. [provider] Taking Active   meclizine (ANTIVERT) 25 MG tablet [id number] Yes Take 1 tablet (25 mg total) by mouth 3 (three) times daily as needed for dizziness. Molpus, John, MD Taking Active            Med Note Thayer Ohm, Debria Garret Apr 20, 2021  2:54 PM)    meloxicam (MOBIC) 15 MG tablet [id number] Yes Take 1 tablet (15 mg total) by mouth daily. Charlett Blake, MD Taking Active   Menthol, Topical Analgesic, (ICY HOT EX) [id number] Yes Apply 1 application topically as needed (pain). [provider] Taking Active Self  metFORMIN (GLUCOPHAGE-XR) 500 MG 24 hr tablet [id number] Yes Take 2 tablets (1,000 mg total) by mouth in the morning and at bedtime. [name], DO Taking Active Self  methocarbamol (ROBAXIN) 500 MG tablet [id number] Yes Take 500 mg by mouth 4 (four) times daily as needed for muscle spasms. [provider] Taking Active   Multiple Vitamin (MULTIVITAMIN) tablet [id number] Yes Take 1 tablet by mouth daily. [provider] Taking Active Self  NONFORMULARY OR COMPOUNDED ITEM [id number] Yes Pt requesting a non powered will hair. [name], DO Taking Active   pravastatin (PRAVACHOL) 40 MG tablet [id number] Yes Take 1 tablet (40 mg  total) by mouth daily. [name] R, DO Taking Active Self  tamsulosin (FLOMAX) 0.4 MG CAPS capsule [id number] Yes TAKE ONE CAPSULE BY MOUTH ONE TIME DAILY [name], DO Taking Active   traMADol (ULTRAM) 50 MG tablet [id number] Yes 2 po q6 h prn  Patient taking differently: Take 100 mg by mouth every 6 (six) hours as needed for moderate pain.   [name], DO Taking Active            Med Note Thayer Ohm, Debria Garret Apr 20, 2021  2:53 PM)              Patient Active Problem List   Diagnosis Date Noted   CIDP (chronic inflammatory demyelinating polyneuropathy) ([place]) 01/12/2021   Diabetes mellitus ([place]) 01/03/2021   Type 2 diabetes mellitus with hyperglycemia, with long-term current use of insulin ([place]) 01/03/2021   Lower extremity edema 11/28/2020   Chronic low back pain 11/28/2020   Psoriatic arthritis ([place]) 11/28/2020   Thoracic aortic aneurysm without rupture 11/28/2020   Bilateral leg weakness 11/05/2020   Acute left-sided low back pain with right-sided sciatica 07/19/2020   Hip pain, acute, left 07/05/2020   Tremor 07/05/2020   Weakness 07/05/2020   Balance problem 03/11/2020   Tinea cruris 03/11/2020   Cellulitis of left groin 03/11/2020   Acute pain of right shoulder 03/11/2020   Interstitial lung disease ([place]) 05/02/2017   Pansinusitis 09/26/2016   Diabetic polyneuropathy associated with diabetes mellitus due to underlying condition ([place]) 05/08/2016   Methamphetamine use disorder, severe, dependence ([place]) 02/11/2016   Substance induced mood disorder ([place]) 02/11/2016   Exertional dyspnea 11/30/2015   ADD (attention deficit disorder) 09/29/2015   Binge eating 09/29/2015   Syncope 05/05/2012   Diarrhea 05/05/2012   Panic attacks 05/05/2012   OSA (obstructive sleep apnea) 05/05/2012   HTN (hypertension) 05/05/2012   Dyslipidemia 05/05/2012  CAD (coronary artery disease)  cardiac cath with moderate disease in a septal branch of the ramus  intermedius 04/01/2012   Chest pain 04/01/2012   Drug abuse and dependence ([place]) 04/01/2012   Family history of coronary artery disease 03/31/2012   Sleep apnea, on C-pap 03/31/2012   Hyperlipemia 03/30/2012   HTN (hypertension), benign 03/30/2012   Morbid obesity ([place]) 03/30/2012   DM type 2, uncontrolled, with neuropathy ([place]) 03/30/2012   Depression with suicidal ideation 03/30/2012    Immunization History  Administered Date(s) Administered   Tdap 08/20/2014    Conditions to be addressed/monitored: CAD, HTN, HLD, DMII and chronic pain (low back) interstitial lung disease; OSA  Care Plan : General Pharmacy (Adult)  Updates made by Cherre Robins, PHARMD since 06/07/2021 12:00 AM     Problem: Medication management and Chronic Care Management, support and education for the following conditions - HTN, Type2 DM with long term insuiln use; hyperlipidemia; neuropathy; OSA; institial lung disease;   Priority: High  Onset Date: 01/06/2021  Note:   Current Barriers:  Difficulty with affording treatment regimen -improved Unable to independently monitor therapeutic efficacy - improved Unable to achieve control of type 2 DM and HTN - improved Does not adhere to prescribed medication regimen - improved Frequent ED visits - improved  Pharmacist Clinical Goal(s):  Over the next 45 days, patient will verbalize ability to afford treatment regimen achieve adherence to monitoring guidelines and medication adherence to achieve therapeutic efficacy achieve control of type 2 DM and HTN as evidenced by attainment of goals listed below Decrease frequency of ED visits  through collaboration with PharmD and provider.   Interventions: 1:1 collaboration with Carollee Herter, Alferd Apa, DO regarding development and update of comprehensive plan of care as evidenced by provider attestation and co-signature Inter-disciplinary care team collaboration (see longitudinal plan of care) Comprehensive medication  review performed; medication list updated in electronic medical record   Interventions: 1:1 collaboration with Carollee Herter, Alferd Apa, DO regarding development and update of comprehensive plan of care as evidenced by provider attestation and co-signature Inter-disciplinary care team collaboration (see longitudinal plan of care) Comprehensive medication review performed; medication list updated in electronic medical record  Diabetes: Lab Results  Component Value Date   HGBA1C 7.5 (H) 05/18/2021  A1c 03/23/2021 at [facility] was 6.9%  Controlled - (goal A1c <7%) Much improved BG since started using [employer] CGM  Reports recent blood glucose at home has been 67 to 150 most days. Had 1 reading 340 when he ate pasta.  Current treatment: Relion NPH insulin- inject 35 units at bedtime Relion Regular insulin - inject 14 units prior to each meal.   Metformin XR 510m - 2 Tablets with Breakfast and 2 tablets with Supper (last refilled 04/20/2021 for 90 DS)  Interventions / Recommendations Recommended continue to take medications as directed by Dr SKelton PillarContinue to check BG frequently [facility] Plan to consider restarting TTyler Aas/ Novolog in 2023 when copay will be $35/30 days the full year of insulin therapy (no coverage gap)   Hypertension: BP Readings from Last 3 Encounters:  05/18/21 (!) 168/101  05/15/21 100/84  05/13/21 131/87  Blood pressure at goal recently - BP goal <140/90 Not checking BP at home regularly Current treatment: Amlodipine 141mdaily  Hydralazine 5091mvery 8 hours Losartan 100m30mily  Denies dizziness  Interventions:  Counseled on importance of maintaining blood pressure at goal to prevent strokes and kidney damage Continue current blood pressure lowering regimen  Hyperlipidemia / Heart Disease: Lipid Panel     Component Value Date/Time   CHOL 118 11/28/2020 1502   TRIG 130.0 11/28/2020 1502   HDL 40.00 11/28/2020 1502   CHOLHDL 3  11/28/2020 1502   VLDL 26.0 11/28/2020 1502   LDLCALC 52 11/28/2020 1502  Controlled - LDL goal <70 Current treatment: Pravastatin 72m at bedtime Interventions:  Counseled on LDL goals Recommended continue current regimen for cholesterol  Chronic Pain / Leg Weakness / CIDP: Currently not controlled but improving per patient since started IVIG treatment Recent IVIG treatment for CIDP increased blood pressure. In future will receive treatment over 2 days every 30 days.  Patient reports he was doing better until he fell and fractured his foot.  Current treatment:  Tramadol 518m- take 2 tablets up to every 8 hours as needed Methocarbamol 50058m take 1 tablet 4 times a day as needed for muscle spasms Voltaren Gel 1% -  apply to area of pain up to 4 times a day as needed Gabapentin 300m27mtake 4 tablets = 1200mg7mimes a day (increased during recent hospitalization) Duloxetine 60mg 59my in morning IVIG infusion every 30 days over 2 days Interventions:  Continue current regimen for pain  Encouraged patient to follow up with neurology and pain management  Anxiety / Insomnia:  Uncontrolled Current therapy: none Medication tried in past:  Trazodone - made him feel like a zombie Hydroxyzine - made him sleepy ("like a benadryl on steroids") Ativan - worked well Citalopram - not helpful Clonazepam - made him moody Escitalopram - not helpful Nortriptyline - made him sleep Interventions:  Continue to take duloxetine in morning.  Patient has been referred to pulmonology for OSA testing and also referred back to psychiatry by PCP.   Health Maintenance:  Reviewed vaccination history and discussed benefits of annual flu, COVID booster and Shingrix vaccinations Patient declined flu vaccine He is considering COVID booster Plans to get Shingrix in 2023 when coverage thru Medicare is expected to improve.   Medication Management:  Reviewed refill history. Only see that he is due to  refill amlodipine  Interventions:   Will continue to follow adherence and assist patient with refills.  Reminded patient that he needs to answer calls for regular checki-ns so that we can order / request refills BEFORE he runs out of medication.  Next mail order due around 07/19/2021. He also is meeting with insurance agent and anticipates he might change Medicare plan for 2023. He will know by end of November. Will check back with a patient then and facilitate refill with mail order and if needed change to new mail order pharmacy based on insurance preference.   Patient Goals/Self-Care Activities Over the next 90 days, patient will:  take medications as prescribed focus on medication adherence by utilizing pharmacy service that delivers medications to his home, check glucose before meals and 2 hour after meals using CGM, document, and provide at future appointments collaborate with provider on medication access solutions and refills   Follow up call planned In 4 weeks      Medication Assistance: None needed at this time .  [id number] [facility] [place] - [address] [address] [place] [id number]: [phone] [facility] [address]  [place] [address] Phone: [phone] Fax: [phone]   Follow Up:  Patient agrees to Care Plan and Follow-up.  Plan: The care management team will reach out to the patient again in 3 to 4weeks.   Cherre Robins, PharmD Clinical Pharmacist [facility] [phone]

## 2021-06-08 DIAGNOSIS — E7800 Pure hypercholesterolemia, unspecified: Secondary | ICD-10-CM | POA: Diagnosis not present

## 2021-06-08 DIAGNOSIS — I10 Essential (primary) hypertension: Secondary | ICD-10-CM | POA: Diagnosis not present

## 2021-06-08 DIAGNOSIS — E1142 Type 2 diabetes mellitus with diabetic polyneuropathy: Secondary | ICD-10-CM | POA: Diagnosis not present

## 2021-06-08 DIAGNOSIS — E1136 Type 2 diabetes mellitus with diabetic cataract: Secondary | ICD-10-CM | POA: Diagnosis not present

## 2021-06-08 DIAGNOSIS — J849 Interstitial pulmonary disease, unspecified: Secondary | ICD-10-CM | POA: Diagnosis not present

## 2021-06-08 DIAGNOSIS — R69 Illness, unspecified: Secondary | ICD-10-CM | POA: Diagnosis not present

## 2021-06-08 DIAGNOSIS — G4733 Obstructive sleep apnea (adult) (pediatric): Secondary | ICD-10-CM | POA: Diagnosis not present

## 2021-06-08 DIAGNOSIS — E1121 Type 2 diabetes mellitus with diabetic nephropathy: Secondary | ICD-10-CM | POA: Diagnosis not present

## 2021-06-08 DIAGNOSIS — M5441 Lumbago with sciatica, right side: Secondary | ICD-10-CM | POA: Diagnosis not present

## 2021-06-08 DIAGNOSIS — I2510 Atherosclerotic heart disease of native coronary artery without angina pectoris: Secondary | ICD-10-CM | POA: Diagnosis not present

## 2021-06-10 DIAGNOSIS — E7800 Pure hypercholesterolemia, unspecified: Secondary | ICD-10-CM | POA: Diagnosis not present

## 2021-06-10 DIAGNOSIS — E1136 Type 2 diabetes mellitus with diabetic cataract: Secondary | ICD-10-CM | POA: Diagnosis not present

## 2021-06-10 DIAGNOSIS — M5441 Lumbago with sciatica, right side: Secondary | ICD-10-CM | POA: Diagnosis not present

## 2021-06-10 DIAGNOSIS — G4733 Obstructive sleep apnea (adult) (pediatric): Secondary | ICD-10-CM | POA: Diagnosis not present

## 2021-06-10 DIAGNOSIS — I2510 Atherosclerotic heart disease of native coronary artery without angina pectoris: Secondary | ICD-10-CM | POA: Diagnosis not present

## 2021-06-10 DIAGNOSIS — I10 Essential (primary) hypertension: Secondary | ICD-10-CM | POA: Diagnosis not present

## 2021-06-10 DIAGNOSIS — E1142 Type 2 diabetes mellitus with diabetic polyneuropathy: Secondary | ICD-10-CM | POA: Diagnosis not present

## 2021-06-10 DIAGNOSIS — J849 Interstitial pulmonary disease, unspecified: Secondary | ICD-10-CM | POA: Diagnosis not present

## 2021-06-10 DIAGNOSIS — E1121 Type 2 diabetes mellitus with diabetic nephropathy: Secondary | ICD-10-CM | POA: Diagnosis not present

## 2021-06-10 DIAGNOSIS — R69 Illness, unspecified: Secondary | ICD-10-CM | POA: Diagnosis not present

## 2021-06-13 ENCOUNTER — Ambulatory Visit: Payer: Medicare HMO | Admitting: *Deleted

## 2021-06-13 DIAGNOSIS — Z794 Long term (current) use of insulin: Secondary | ICD-10-CM

## 2021-06-13 DIAGNOSIS — R600 Localized edema: Secondary | ICD-10-CM

## 2021-06-13 DIAGNOSIS — R531 Weakness: Secondary | ICD-10-CM

## 2021-06-13 DIAGNOSIS — G6181 Chronic inflammatory demyelinating polyneuritis: Secondary | ICD-10-CM

## 2021-06-13 DIAGNOSIS — I10 Essential (primary) hypertension: Secondary | ICD-10-CM

## 2021-06-13 DIAGNOSIS — R2689 Other abnormalities of gait and mobility: Secondary | ICD-10-CM

## 2021-06-13 DIAGNOSIS — E0842 Diabetes mellitus due to underlying condition with diabetic polyneuropathy: Secondary | ICD-10-CM

## 2021-06-13 NOTE — Patient Instructions (Signed)
Visit Information  PATIENT GOALS:  Goals Addressed               This Visit's Progress     Receive Long-Term Placement in an [place]. (pt-stated)   On track     Timeframe:  Long-Range Goal Priority:  High Start Date:  03/30/2021                        Expected End Date: 08/04/2021  Follow-Up Plan:  07/04/2021 at 9:45am  Patient Goals/Self-Care Activities: Continue to receive home health services through [facility] [phone]# [phone]), in the form of nursing, physical therapy, occupational therapy, speech therapy, social worker and bath aide. Collaboration with home health social worker to receive assistance with placement into a higher level of care. Confirmation received that order and completed paperwork have been submitted to [employer] [facility] [employer] [phone]# [phone]), to obtain a power wheelchair. Please contact LCSW directly (# [phone]) if you have questions, need assistance, or if additional social work needs are identified between now and our next scheduled telephone outreach call.        Patient verbalizes understanding of instructions provided today and agrees to view in [place].   Telephone follow up appointment with care management team member scheduled for:  07/04/2021 at 9:45am  [place] Licensed Clinical Social Worker [place] [phone]

## 2021-06-13 NOTE — Chronic Care Management (AMB) (Signed)
Chronic Care Management    Clinical Social Work Note  06/13/2021 Name: Tyler Brewer MRN: [id number] DOB: 1961/06/15  Tyler Brewer is a 60 y.o. year old male who is a primary care patient of Tyler Held, DO. The CCM team was consulted to assist the patient with chronic disease management and/or care coordination needs related to: Level of Care Concerns.   Engaged with patient by telephone for follow-up visit in response to provider referral for social work chronic care management and care coordination services.   Consent to Services:  The patient was given information about Chronic Care Management services, agreed to services, and gave verbal consent prior to initiation of services.  Please see initial visit note for detailed documentation.   Patient agreed to services and consent obtained.   Assessment: Review of patient past medical history, allergies, medications, and health status, including review of relevant consultants reports was performed today as part of a comprehensive evaluation and provision of chronic care management and care coordination services.     SDOH (Social Determinants of Health) assessments and interventions performed:    Advanced Directives Status: Not addressed in this encounter.  CCM Care Plan  No Known Allergies  Outpatient Encounter Medications as of 06/13/2021  Medication Sig   acetaminophen (TYLENOL) 500 MG tablet Take 500-1,000 mg by mouth every 6 (six) hours as needed for mild pain.   amLODipine (NORVASC) 10 MG tablet Take 1 tablet (10 mg total) by mouth daily.   COMFORT EZ INSULIN SYRINGE 31G X 5/16" 1 ML MISC    Cyanocobalamin (VITAMIN B-12 PO) Take 1 tablet by mouth daily.   diclofenac Sodium (VOLTAREN) 1 % GEL Apply 4 g topically 4 (four) times daily as needed (joint pain).   DULoxetine (CYMBALTA) 60 MG capsule Take 1 capsule by mouth daily.   furosemide (LASIX) 20 MG tablet TAKE 1 TABLET(20 MG) BY MOUTH DAILY (Patient taking  differently: Take 40 mg by mouth daily.)   gabapentin (NEURONTIN) 300 MG capsule Take 1,200 mg by mouth 3 (three) times daily.   hydrALAZINE (APRESOLINE) 50 MG tablet Take 1 tablet (50 mg total) by mouth every 8 (eight) hours.   ibuprofen (ADVIL) 200 MG tablet Take 400 mg by mouth every 6 (six) hours as needed for mild pain.   Immune Globulin 10% (PRIVIGEN) 5 GM/50ML SOLN Inject into the vein. Every 3 weeks   insulin NPH Human (NOVOLIN N) 100 UNIT/ML injection Inject 35 Units into the skin at bedtime.   Insulin Pen Needle 31G X 5 MM MISC 1 Device by Does not apply route in the morning, at noon, in the evening, and at bedtime.   Insulin Regular Human (HUMULIN R) 100 UNIT/ML KwikPen Inject 14 Units into the skin 3 (three) times daily before meals. Uses [employer] Relion R insulin   losartan (COZAAR) 100 MG tablet TAKE ONE TABLET BY MOUTH DAILY   MAGNESIUM PO Take 1 tablet by mouth daily.   meclizine (ANTIVERT) 25 MG tablet Take 1 tablet (25 mg total) by mouth 3 (three) times daily as needed for dizziness.   meloxicam (MOBIC) 15 MG tablet Take 1 tablet (15 mg total) by mouth daily.   Menthol, Topical Analgesic, (ICY HOT EX) Apply 1 application topically as needed (pain).   metFORMIN (GLUCOPHAGE-XR) 500 MG 24 hr tablet Take 2 tablets (1,000 mg total) by mouth in the morning and at bedtime.   methocarbamol (ROBAXIN) 500 MG tablet Take 500 mg by mouth 4 (four) times daily as needed for muscle  spasms.   Multiple Vitamin (MULTIVITAMIN) tablet Take 1 tablet by mouth daily.   NONFORMULARY OR COMPOUNDED ITEM Pt requesting a non powered will hair.   pravastatin (PRAVACHOL) 40 MG tablet Take 1 tablet (40 mg total) by mouth daily.   tamsulosin (FLOMAX) 0.4 MG CAPS capsule TAKE ONE CAPSULE BY MOUTH ONE TIME DAILY   traMADol (ULTRAM) 50 MG tablet 2 po q6 h prn (Patient taking differently: Take 100 mg by mouth every 6 (six) hours as needed for moderate pain.)   No facility-administered encounter medications on file  as of 06/13/2021.    Patient Active Problem List   Diagnosis Date Noted   CIDP (chronic inflammatory demyelinating polyneuropathy) ([place]) 01/12/2021   Diabetes mellitus ([place]) 01/03/2021   Type 2 diabetes mellitus with hyperglycemia, with long-term current use of insulin ([place]) 01/03/2021   Lower extremity edema 11/28/2020   Chronic low back pain 11/28/2020   Psoriatic arthritis ([place]) 11/28/2020   Thoracic aortic aneurysm without rupture 11/28/2020   Bilateral leg weakness 11/05/2020   Acute left-sided low back pain with right-sided sciatica 07/19/2020   Hip pain, acute, left 07/05/2020   Tremor 07/05/2020   Weakness 07/05/2020   Balance problem 03/11/2020   Tinea cruris 03/11/2020   Cellulitis of left groin 03/11/2020   Acute pain of right shoulder 03/11/2020   Interstitial lung disease ([place]) 05/02/2017   Pansinusitis 09/26/2016   Diabetic polyneuropathy associated with diabetes mellitus due to underlying condition ([place]) 05/08/2016   Methamphetamine use disorder, severe, dependence ([place]) 02/11/2016   Substance induced mood disorder ([place]) 02/11/2016   Exertional dyspnea 11/30/2015   ADD (attention deficit disorder) 09/29/2015   Binge eating 09/29/2015   Syncope 05/05/2012   Diarrhea 05/05/2012   Panic attacks 05/05/2012   OSA (obstructive sleep apnea) 05/05/2012   HTN (hypertension) 05/05/2012   Dyslipidemia 05/05/2012   CAD (coronary artery disease)  cardiac cath with moderate disease in a septal branch of the ramus intermedius 04/01/2012   Chest pain 04/01/2012   Drug abuse and dependence ([place]) 04/01/2012   Family history of coronary artery disease 03/31/2012   Sleep apnea, on C-pap 03/31/2012   Hyperlipemia 03/30/2012   HTN (hypertension), benign 03/30/2012   Morbid obesity ([place]) 03/30/2012   DM type 2, uncontrolled, with neuropathy ([place]) 03/30/2012   Depression with suicidal ideation 03/30/2012    Conditions to be addressed/monitored: HTN and DMII.  Housing Barriers,  Level of Care Concerns, Limited Access to Caregiver, and Lacks Knowledge of [employer].  Care Plan : LCSW Plan of Care  Updates made by Francis Gaines, LCSW since 06/13/2021 12:00 AM     Problem: Receive Long-Term Placement in an [place].   Priority: High     Long-Range Goal: Receive Long-Term Placement in an [place].   Start Date: 03/30/2021  Expected End Date: 08/04/2021  This Visit's Progress: On track  Recent Progress: On track  Priority: High  Note:   Current Barriers:   Patient with History of Falls/Fall Risk, Unsteady Gait, Impaired Mobility, Weakness, Tremor, Syncope, Balance Problem and Lower Extremity Edema needs assistance with initiating short-term rehabilitative services in a skilled nursing facility or long-term care in an assisted living facility. Clinical Goal(s):  Patient will receive assistance with initiating placement, whether it be assisted or skilled level care.   Interventions: Collaboration with Primary Care Physician, Dr. [name] regarding development and update of comprehensive plan of care as evidenced by provider attestation and co-signature. Patient Goals/Self-Care Activities:  Continue to receive  home health services through [facility] [phone]), in the form of nursing, physical therapy, occupational therapy, speech therapy, social worker and bath aide. Collaboration with home health social worker to receive assistance with placement into a higher level of care. Confirmation received that order and completed paperwork have been submitted to [employer] [phone]# [phone]), to obtain a power wheelchair. Please contact LCSW directly (# [phone]) if you have questions, need assistance, or if additional social work needs are identified between now and our next scheduled telephone outreach call. Follow-Up:  07/04/2021 at 9:45am   [place] Clinical  Social Worker [address] [phone]

## 2021-06-14 DIAGNOSIS — G6181 Chronic inflammatory demyelinating polyneuritis: Secondary | ICD-10-CM | POA: Diagnosis not present

## 2021-06-15 DIAGNOSIS — G4733 Obstructive sleep apnea (adult) (pediatric): Secondary | ICD-10-CM | POA: Diagnosis not present

## 2021-06-15 DIAGNOSIS — J849 Interstitial pulmonary disease, unspecified: Secondary | ICD-10-CM | POA: Diagnosis not present

## 2021-06-15 DIAGNOSIS — E1136 Type 2 diabetes mellitus with diabetic cataract: Secondary | ICD-10-CM | POA: Diagnosis not present

## 2021-06-15 DIAGNOSIS — E1121 Type 2 diabetes mellitus with diabetic nephropathy: Secondary | ICD-10-CM | POA: Diagnosis not present

## 2021-06-15 DIAGNOSIS — I10 Essential (primary) hypertension: Secondary | ICD-10-CM | POA: Diagnosis not present

## 2021-06-15 DIAGNOSIS — M5441 Lumbago with sciatica, right side: Secondary | ICD-10-CM | POA: Diagnosis not present

## 2021-06-15 DIAGNOSIS — E7800 Pure hypercholesterolemia, unspecified: Secondary | ICD-10-CM | POA: Diagnosis not present

## 2021-06-15 DIAGNOSIS — E1142 Type 2 diabetes mellitus with diabetic polyneuropathy: Secondary | ICD-10-CM | POA: Diagnosis not present

## 2021-06-15 DIAGNOSIS — I2510 Atherosclerotic heart disease of native coronary artery without angina pectoris: Secondary | ICD-10-CM | POA: Diagnosis not present

## 2021-06-15 DIAGNOSIS — R69 Illness, unspecified: Secondary | ICD-10-CM | POA: Diagnosis not present

## 2021-06-15 MED ORDER — NONFORMULARY OR COMPOUNDED ITEM: 0 refills | Status: AC

## 2021-06-15 MED ORDER — NONFORMULARY OR COMPOUNDED ITEM: 0 refills | Status: DC

## 2021-06-15 NOTE — Addendum Note (Signed)
Addended byDamita Dunnings D on: 06/15/2021 03:18 PM   Modules accepted: Orders

## 2021-06-16 DIAGNOSIS — E1121 Type 2 diabetes mellitus with diabetic nephropathy: Secondary | ICD-10-CM | POA: Diagnosis not present

## 2021-06-16 DIAGNOSIS — E1142 Type 2 diabetes mellitus with diabetic polyneuropathy: Secondary | ICD-10-CM | POA: Diagnosis not present

## 2021-06-16 DIAGNOSIS — M5441 Lumbago with sciatica, right side: Secondary | ICD-10-CM | POA: Diagnosis not present

## 2021-06-16 DIAGNOSIS — I10 Essential (primary) hypertension: Secondary | ICD-10-CM | POA: Diagnosis not present

## 2021-06-16 DIAGNOSIS — G4733 Obstructive sleep apnea (adult) (pediatric): Secondary | ICD-10-CM | POA: Diagnosis not present

## 2021-06-16 DIAGNOSIS — E1136 Type 2 diabetes mellitus with diabetic cataract: Secondary | ICD-10-CM | POA: Diagnosis not present

## 2021-06-16 DIAGNOSIS — R69 Illness, unspecified: Secondary | ICD-10-CM | POA: Diagnosis not present

## 2021-06-16 DIAGNOSIS — J849 Interstitial pulmonary disease, unspecified: Secondary | ICD-10-CM | POA: Diagnosis not present

## 2021-06-16 DIAGNOSIS — E7800 Pure hypercholesterolemia, unspecified: Secondary | ICD-10-CM | POA: Diagnosis not present

## 2021-06-16 DIAGNOSIS — I2510 Atherosclerotic heart disease of native coronary artery without angina pectoris: Secondary | ICD-10-CM | POA: Diagnosis not present

## 2021-06-18 DIAGNOSIS — E1165 Type 2 diabetes mellitus with hyperglycemia: Secondary | ICD-10-CM | POA: Diagnosis not present

## 2021-06-19 ENCOUNTER — Telehealth: Payer: Self-pay | Admitting: Family Medicine

## 2021-06-19 DIAGNOSIS — I10 Essential (primary) hypertension: Secondary | ICD-10-CM

## 2021-06-19 DIAGNOSIS — Z794 Long term (current) use of insulin: Secondary | ICD-10-CM | POA: Diagnosis not present

## 2021-06-19 DIAGNOSIS — E1165 Type 2 diabetes mellitus with hyperglycemia: Secondary | ICD-10-CM | POA: Diagnosis not present

## 2021-06-19 DIAGNOSIS — E0842 Diabetes mellitus due to underlying condition with diabetic polyneuropathy: Secondary | ICD-10-CM

## 2021-06-19 NOTE — Telephone Encounter (Signed)
FYI for Tyler Brewer, Patient missed 2 Physical Therapies last week due to Infusion treatment at [facility].

## 2021-06-19 NOTE — Telephone Encounter (Signed)
[facility] keeps calling stating that they have no received the master RX that has been faxed over.

## 2021-06-20 DIAGNOSIS — R69 Illness, unspecified: Secondary | ICD-10-CM | POA: Diagnosis not present

## 2021-06-20 DIAGNOSIS — I10 Essential (primary) hypertension: Secondary | ICD-10-CM | POA: Diagnosis not present

## 2021-06-20 DIAGNOSIS — E1142 Type 2 diabetes mellitus with diabetic polyneuropathy: Secondary | ICD-10-CM | POA: Diagnosis not present

## 2021-06-20 DIAGNOSIS — M5441 Lumbago with sciatica, right side: Secondary | ICD-10-CM | POA: Diagnosis not present

## 2021-06-20 DIAGNOSIS — E1121 Type 2 diabetes mellitus with diabetic nephropathy: Secondary | ICD-10-CM | POA: Diagnosis not present

## 2021-06-20 DIAGNOSIS — I2510 Atherosclerotic heart disease of native coronary artery without angina pectoris: Secondary | ICD-10-CM | POA: Diagnosis not present

## 2021-06-20 DIAGNOSIS — E7800 Pure hypercholesterolemia, unspecified: Secondary | ICD-10-CM | POA: Diagnosis not present

## 2021-06-20 DIAGNOSIS — G4733 Obstructive sleep apnea (adult) (pediatric): Secondary | ICD-10-CM | POA: Diagnosis not present

## 2021-06-20 DIAGNOSIS — E1136 Type 2 diabetes mellitus with diabetic cataract: Secondary | ICD-10-CM | POA: Diagnosis not present

## 2021-06-20 DIAGNOSIS — J849 Interstitial pulmonary disease, unspecified: Secondary | ICD-10-CM | POA: Diagnosis not present

## 2021-06-21 DIAGNOSIS — M5441 Lumbago with sciatica, right side: Secondary | ICD-10-CM | POA: Diagnosis not present

## 2021-06-21 DIAGNOSIS — E1136 Type 2 diabetes mellitus with diabetic cataract: Secondary | ICD-10-CM | POA: Diagnosis not present

## 2021-06-21 DIAGNOSIS — E1121 Type 2 diabetes mellitus with diabetic nephropathy: Secondary | ICD-10-CM | POA: Diagnosis not present

## 2021-06-21 DIAGNOSIS — E7800 Pure hypercholesterolemia, unspecified: Secondary | ICD-10-CM | POA: Diagnosis not present

## 2021-06-21 DIAGNOSIS — E1142 Type 2 diabetes mellitus with diabetic polyneuropathy: Secondary | ICD-10-CM | POA: Diagnosis not present

## 2021-06-21 DIAGNOSIS — I2510 Atherosclerotic heart disease of native coronary artery without angina pectoris: Secondary | ICD-10-CM | POA: Diagnosis not present

## 2021-06-21 DIAGNOSIS — G4733 Obstructive sleep apnea (adult) (pediatric): Secondary | ICD-10-CM | POA: Diagnosis not present

## 2021-06-21 DIAGNOSIS — R69 Illness, unspecified: Secondary | ICD-10-CM | POA: Diagnosis not present

## 2021-06-21 DIAGNOSIS — I10 Essential (primary) hypertension: Secondary | ICD-10-CM | POA: Diagnosis not present

## 2021-06-21 DIAGNOSIS — J849 Interstitial pulmonary disease, unspecified: Secondary | ICD-10-CM | POA: Diagnosis not present

## 2021-06-21 NOTE — Telephone Encounter (Signed)
RX faxed again.

## 2021-06-23 DIAGNOSIS — M5441 Lumbago with sciatica, right side: Secondary | ICD-10-CM | POA: Diagnosis not present

## 2021-06-23 DIAGNOSIS — J849 Interstitial pulmonary disease, unspecified: Secondary | ICD-10-CM | POA: Diagnosis not present

## 2021-06-23 DIAGNOSIS — I10 Essential (primary) hypertension: Secondary | ICD-10-CM | POA: Diagnosis not present

## 2021-06-23 DIAGNOSIS — E1142 Type 2 diabetes mellitus with diabetic polyneuropathy: Secondary | ICD-10-CM | POA: Diagnosis not present

## 2021-06-23 DIAGNOSIS — I2510 Atherosclerotic heart disease of native coronary artery without angina pectoris: Secondary | ICD-10-CM | POA: Diagnosis not present

## 2021-06-23 DIAGNOSIS — E7800 Pure hypercholesterolemia, unspecified: Secondary | ICD-10-CM | POA: Diagnosis not present

## 2021-06-23 DIAGNOSIS — R69 Illness, unspecified: Secondary | ICD-10-CM | POA: Diagnosis not present

## 2021-06-23 DIAGNOSIS — E1136 Type 2 diabetes mellitus with diabetic cataract: Secondary | ICD-10-CM | POA: Diagnosis not present

## 2021-06-23 DIAGNOSIS — E1121 Type 2 diabetes mellitus with diabetic nephropathy: Secondary | ICD-10-CM | POA: Diagnosis not present

## 2021-06-23 DIAGNOSIS — G4733 Obstructive sleep apnea (adult) (pediatric): Secondary | ICD-10-CM | POA: Diagnosis not present

## 2021-06-24 ENCOUNTER — Emergency Department (HOSPITAL_BASED_OUTPATIENT_CLINIC_OR_DEPARTMENT_OTHER)
Admission: EM | Admit: 2021-06-24 | Discharge: 2021-06-24 | Disposition: A | Discharge status: Home / Self Care | Payer: Medicare HMO | Attending: Emergency Medicine | Admitting: Emergency Medicine

## 2021-06-24 DIAGNOSIS — R2240 Localized swelling, mass and lump, unspecified lower limb: Secondary | ICD-10-CM | POA: Diagnosis not present

## 2021-06-24 DIAGNOSIS — R609 Edema, unspecified: Secondary | ICD-10-CM | POA: Diagnosis not present

## 2021-06-24 DIAGNOSIS — Z743 Need for continuous supervision: Secondary | ICD-10-CM | POA: Diagnosis not present

## 2021-06-24 DIAGNOSIS — Z5321 Procedure and treatment not carried out due to patient leaving prior to being seen by health care provider: Secondary | ICD-10-CM | POA: Diagnosis not present

## 2021-06-24 DIAGNOSIS — R739 Hyperglycemia, unspecified: Secondary | ICD-10-CM | POA: Diagnosis not present

## 2021-06-24 NOTE — ED Triage Notes (Signed)
Brought in by EMS. C/o feet are more swollen. NAD. VS per ems 173/75, HR 92, T 98.4, RR 18, O2 98% CBG 284

## 2021-06-26 ENCOUNTER — Encounter: Payer: Self-pay | Admitting: Internal Medicine

## 2021-06-27 DIAGNOSIS — Z79899 Other long term (current) drug therapy: Secondary | ICD-10-CM | POA: Diagnosis not present

## 2021-06-27 DIAGNOSIS — Z7984 Long term (current) use of oral hypoglycemic drugs: Secondary | ICD-10-CM | POA: Diagnosis not present

## 2021-06-27 DIAGNOSIS — M792 Neuralgia and neuritis, unspecified: Secondary | ICD-10-CM | POA: Diagnosis not present

## 2021-06-27 DIAGNOSIS — G629 Polyneuropathy, unspecified: Secondary | ICD-10-CM | POA: Diagnosis not present

## 2021-06-27 DIAGNOSIS — Z794 Long term (current) use of insulin: Secondary | ICD-10-CM | POA: Diagnosis not present

## 2021-06-27 DIAGNOSIS — E1140 Type 2 diabetes mellitus with diabetic neuropathy, unspecified: Secondary | ICD-10-CM | POA: Diagnosis not present

## 2021-06-27 DIAGNOSIS — R531 Weakness: Secondary | ICD-10-CM | POA: Diagnosis not present

## 2021-06-29 ENCOUNTER — Ambulatory Visit (INDEPENDENT_AMBULATORY_CARE_PROVIDER_SITE_OTHER): Payer: Medicare HMO

## 2021-06-29 ENCOUNTER — Encounter: Payer: Self-pay | Admitting: Family Medicine

## 2021-06-29 DIAGNOSIS — G6181 Chronic inflammatory demyelinating polyneuritis: Secondary | ICD-10-CM

## 2021-06-29 DIAGNOSIS — E0842 Diabetes mellitus due to underlying condition with diabetic polyneuropathy: Secondary | ICD-10-CM

## 2021-06-29 NOTE — Chronic Care Management (AMB) (Signed)
Chronic Care Management   CCM RN Visit Note  06/29/2021 Name: Staci Carver MRN: [id number] DOB: Apr 22, 1961  Subjective: Darcell Sabino is a 60 y.o. year old male who is a primary care patient of [name], DO. The care management team was consulted for assistance with disease management and care coordination needs.    Engaged with patient by telephone for follow up visit in response to provider referral for case management and/or care coordination services.   Consent to Services:  The patient was given information about Chronic Care Management services, agreed to services, and gave verbal consent prior to initiation of services.  Please see initial visit note for detailed documentation.   Patient agreed to services and verbal consent obtained.   Assessment: Review of patient past medical history, allergies, medications, health status, including review of consultants reports, laboratory and other test data, was performed as part of comprehensive evaluation and provision of chronic care management services.   SDOH (Social Determinants of Health) assessments and interventions performed:    CCM Care Plan  No Known Allergies  Outpatient Encounter Medications as of 06/29/2021  Medication Sig   acetaminophen (TYLENOL) 500 MG tablet Take 500-1,000 mg by mouth every 6 (six) hours as needed for mild pain.   amLODipine (NORVASC) 10 MG tablet Take 1 tablet (10 mg total) by mouth daily.   Cyanocobalamin (VITAMIN B-12 PO) Take 1 tablet by mouth daily.   diclofenac Sodium (VOLTAREN) 1 % GEL Apply 4 g topically 4 (four) times daily as needed (joint pain).   DULoxetine (CYMBALTA) 60 MG capsule Take 1 capsule by mouth daily.   furosemide (LASIX) 20 MG tablet TAKE 1 TABLET(20 MG) BY MOUTH DAILY (Patient taking differently: Take 40 mg by mouth daily.)   gabapentin (NEURONTIN) 300 MG capsule Take 1,200 mg by mouth 3 (three) times daily.   hydrALAZINE (APRESOLINE) 50 MG tablet Take 1  tablet (50 mg total) by mouth every 8 (eight) hours.   ibuprofen (ADVIL) 200 MG tablet Take 400 mg by mouth every 6 (six) hours as needed for mild pain.   Immune Globulin 10% (PRIVIGEN) 5 GM/50ML SOLN Inject into the vein. Every 3 weeks   insulin NPH Human (NOVOLIN N) 100 UNIT/ML injection Inject 35 Units into the skin at bedtime.   Insulin Regular Human (HUMULIN R) 100 UNIT/ML KwikPen Inject 14 Units into the skin 3 (three) times daily before meals. Uses [employer] Relion R insulin   losartan (COZAAR) 100 MG tablet TAKE ONE TABLET BY MOUTH DAILY   MAGNESIUM PO Take 1 tablet by mouth daily.   meclizine (ANTIVERT) 25 MG tablet Take 1 tablet (25 mg total) by mouth 3 (three) times daily as needed for dizziness.   meloxicam (MOBIC) 15 MG tablet Take 1 tablet (15 mg total) by mouth daily.   metFORMIN (GLUCOPHAGE-XR) 500 MG 24 hr tablet Take 2 tablets (1,000 mg total) by mouth in the morning and at bedtime.   methocarbamol (ROBAXIN) 500 MG tablet Take 500 mg by mouth 4 (four) times daily as needed for muscle spasms.   Multiple Vitamin (MULTIVITAMIN) tablet Take 1 tablet by mouth daily.   pravastatin (PRAVACHOL) 40 MG tablet Take 1 tablet (40 mg total) by mouth daily.   tamsulosin (FLOMAX) 0.4 MG CAPS capsule TAKE ONE CAPSULE BY MOUTH ONE TIME DAILY   traMADol (ULTRAM) 50 MG tablet 2 po q6 h prn (Patient taking differently: Take 100 mg by mouth every 6 (six) hours as needed for moderate pain.)   COMFORT  EZ INSULIN SYRINGE 31G X 5/16" 1 ML MISC    Insulin Pen Needle 31G X 5 MM MISC 1 Device by Does not apply route in the morning, at noon, in the evening, and at bedtime.   Menthol, Topical Analgesic, (ICY HOT EX) Apply 1 application topically as needed (pain).   NONFORMULARY OR COMPOUNDED ITEM Pt requesting a non powered will hair.   NONFORMULARY OR COMPOUNDED ITEM Power wheelchair   No facility-administered encounter medications on file as of 06/29/2021.    Patient Active Problem List   Diagnosis Date  Noted   CIDP (chronic inflammatory demyelinating polyneuropathy) ([place]) 01/12/2021   Diabetes mellitus ([place]) 01/03/2021   Type 2 diabetes mellitus with hyperglycemia, with long-term current use of insulin ([place]) 01/03/2021   Lower extremity edema 11/28/2020   Chronic low back pain 11/28/2020   Psoriatic arthritis ([place]) 11/28/2020   Thoracic aortic aneurysm without rupture 11/28/2020   Bilateral leg weakness 11/05/2020   Acute left-sided low back pain with right-sided sciatica 07/19/2020   Hip pain, acute, left 07/05/2020   Tremor 07/05/2020   Weakness 07/05/2020   Balance problem 03/11/2020   Tinea cruris 03/11/2020   Cellulitis of left groin 03/11/2020   Acute pain of right shoulder 03/11/2020   Interstitial lung disease ([place]) 05/02/2017   Pansinusitis 09/26/2016   Diabetic polyneuropathy associated with diabetes mellitus due to underlying condition ([place]) 05/08/2016   Methamphetamine use disorder, severe, dependence ([place]) 02/11/2016   Substance induced mood disorder ([place]) 02/11/2016   Exertional dyspnea 11/30/2015   ADD (attention deficit disorder) 09/29/2015   Binge eating 09/29/2015   Syncope 05/05/2012   Diarrhea 05/05/2012   Panic attacks 05/05/2012   OSA (obstructive sleep apnea) 05/05/2012   HTN (hypertension) 05/05/2012   Dyslipidemia 05/05/2012   CAD (coronary artery disease)  cardiac cath with moderate disease in a septal branch of the ramus intermedius 04/01/2012   Chest pain 04/01/2012   Drug abuse and dependence ([place]) 04/01/2012   Family history of coronary artery disease 03/31/2012   Sleep apnea, on C-pap 03/31/2012   Hyperlipemia 03/30/2012   HTN (hypertension), benign 03/30/2012   Morbid obesity ([place]) 03/30/2012   DM type 2, uncontrolled, with neuropathy ([place]) 03/30/2012   Depression with suicidal ideation 03/30/2012    Conditions to be addressed/monitored:HTN and CDIP/Chronic Pain   Care Plan : RN Case Manager Plan of Care  Updates made by Luretha Rued, RN since 06/29/2021 12:00 AM     Problem: Chronic Disease Management Education and Care Coordination needs (DM, CDIP/chronic pain)-   Priority: High     Long-Range Goal: Development of Plan of care for Chronic Disease   Start Date: 06/29/2021  Priority: High  Note:   Current Barriers: Mr. Tilghman reports improvement in leg strength, and pain also improved since IVG treatments began. However he reports swelling in both feet that is causing pain now. He reports currently "5" on the pain scale. He reports two treatments left until the end of the year and continues to work with home health PT/OT and nursing to improve strength/mobility. Patient reports his blood sugar was 215 this morning before eating. He states that his blood sugar usually runs in the high 100's. He does not have an appointment scheduled with Endocrinologist.  Care Coordination needs related to Transportation, Level of care concerns, and Limited access to caregiver Chronic Disease Management support and education needs related to DMII and CIDP/Chronic pain Lacks caregiver support  RNCM Clinical Goal(s):  Patient will verbalize understanding of plan  for management of DMII and CIDP/Chronic pain attend all scheduled medical appointments: 06/30/21 with primary care provider. work with Education officer, museum to address  related to the management of Level of care concerns related to the management of DMII and CIDP/Chronic pain will demonstrate ongoing self health care management ability    through collaboration with RN Care manager, provider, and care team.  Patient will verbalize understanding of plan for management of DMII and CIDP/Chronic pain to assist in maintenance and/or improvement of quality of life.  Interventions: 1:1 collaboration with primary care provider regarding development and update of comprehensive plan of care as evidenced by provider attestation and co-signature Inter-disciplinary care team collaboration (see  longitudinal plan of care) Evaluation of current treatment plan related to  self management and patient's adherence to plan as established by provider  CIPD/Chronic Pain: Long Term Goal on track:  Yes Evaluation of current treatment plan related to  patient's health conditions   and  self-management and patient's adherence to plan as established by provider. Discussed plans with patient for ongoing care management follow up and provided patient with direct contact information for care management team Reviewed medications with patient and discussed importance of taking as prescribed; Reviewed scheduled/upcoming provider appointments including; Advised patient to discuss concern about bilateral feet swelling with provider; at scheduled appointment tomorrow. Encouraged patient to make sure he has transportation services scheduled for tomorrow's appointment. Encouraged patient to keep feet elevated  Diabetes Interventions: Long Term Goal on track: No Assessed patient's understanding of A1c goal: <7% Reviewed medications with patient and discussed importance of medication adherence; Discussed plans with patient for ongoing care management follow up and provided patient with direct contact information for care management team; Per patient request, RNCM called and obtained appointment with Endocrinologist. Important to schedule on an off time of his IVG treatments. Appointment scheduled for the [place] location for 07/11/21 at 2:40pm. No answer at return call. Voice message left with date/time of appointment.  Lab Results  Component Value Date   HGBA1C 7.5 (H) 05/18/2021  Patient Goals/Self-Care Activities: You have an Appointment with Dr. [name] on 07/11/21 at 2:40 pm. Please contact the office if you need to change this appointment. Patient will self administer medications as prescribed Patient will attend all scheduled provider appointments Patient will continue to perform ADL's  independently Patient will call provider office for new concerns or questions Follow provider recommendations regarding sleep Continue to perform exercises recommended by your home health physical therapist and occupational therapist. Continue to use walker as recommended and minimize fall risk (eliminate small throw rugs, keep home well lite, remove clutter from walkways), use hand rails where available, consider installing grab bars/rails in other places as needed. Contact RNCM if you have any questions or concerns or community resource needs.  Plan:Telephone follow up appointment with care management team member scheduled for:  next month The patient has been provided with contact information for the care management team and has been advised to call with any health related questions or concerns.    Thea Silversmith, RN, MSN, BSN, CCM Care Management Coordinator [facility] [phone]

## 2021-06-29 NOTE — Patient Instructions (Signed)
Visit Information: Thank you for taking the time to speak with me today. See Goals Below.  Patient Goals/Self-Care Activities: You have an Appointment with Dr. [name] on 07/11/21 at 2:40 pm. Please contact the office if you need to change this appointment. Patient will self administer medications as prescribed Patient will attend all scheduled provider appointments Patient will continue to perform ADL's independently Patient will call provider office for new concerns or questions Follow provider recommendations regarding sleep Continue to perform exercises recommended by your home health physical therapist and occupational therapist. Continue to use walker as recommended and minimize fall risk (eliminate small throw rugs, keep home well lite, remove clutter from walkways), use hand rails where available, consider installing grab bars/rails in other places as needed. Contact RNCM if you have any questions or concerns or community resource needs.  Patient verbalizes understanding of instructions provided today and agrees to view in [place].   Telephone follow up appointment with care management team member scheduled for: 07/31/21 The patient has been provided with contact information for the care management team and has been advised to call with any health related questions or concerns.   Thea Silversmith, RN, MSN, BSN, CCM Care Management Coordinator [facility] [phone]

## 2021-06-30 ENCOUNTER — Other Ambulatory Visit: Payer: Self-pay

## 2021-06-30 ENCOUNTER — Encounter: Payer: Self-pay | Admitting: Family Medicine

## 2021-06-30 ENCOUNTER — Telehealth: Payer: Self-pay | Admitting: Family Medicine

## 2021-06-30 ENCOUNTER — Ambulatory Visit (INDEPENDENT_AMBULATORY_CARE_PROVIDER_SITE_OTHER): Payer: Medicare HMO | Admitting: Family Medicine

## 2021-06-30 VITALS — BP 140/90 | HR 90 | Temp 97.7°F | Resp 20 | Ht 71.0 in

## 2021-06-30 DIAGNOSIS — F418 Other specified anxiety disorders: Secondary | ICD-10-CM | POA: Diagnosis not present

## 2021-06-30 DIAGNOSIS — G8929 Other chronic pain: Secondary | ICD-10-CM | POA: Diagnosis not present

## 2021-06-30 DIAGNOSIS — R600 Localized edema: Secondary | ICD-10-CM

## 2021-06-30 DIAGNOSIS — M545 Low back pain, unspecified: Secondary | ICD-10-CM

## 2021-06-30 DIAGNOSIS — G6181 Chronic inflammatory demyelinating polyneuritis: Secondary | ICD-10-CM | POA: Diagnosis not present

## 2021-06-30 DIAGNOSIS — R69 Illness, unspecified: Secondary | ICD-10-CM | POA: Diagnosis not present

## 2021-06-30 MED ORDER — TRAMADOL HCL 50 MG PO TABS: ORAL_TABLET | ORAL | 1 refills | Status: DC

## 2021-06-30 MED ORDER — FUROSEMIDE 20 MG PO TABS: 40.0000 mg | ORAL_TABLET | Freq: Every day | ORAL | 1 refills | Status: DC

## 2021-06-30 MED ORDER — HYDROXYZINE HCL 25 MG PO TABS: 25.0000 mg | ORAL_TABLET | Freq: Four times a day (QID) | ORAL | 0 refills | Status: DC | PRN

## 2021-06-30 NOTE — Telephone Encounter (Signed)
[place]: Nicolasa Ducking: [phone]  Pt missed PT on 11/10 due to rectal pain. Pt went to [facility] to be checked out.

## 2021-06-30 NOTE — Patient Instructions (Signed)
Edema °Edema is an abnormal buildup of fluids in the body tissues and under the skin. Swelling of the legs, feet, and ankles is a common symptom that becomes more likely as you get older. Swelling is also common in looser tissues, like around the eyes. When the affected area is squeezed, the fluid may move out of that spot and leave a dent for a few moments. This dent is called pitting edema. °There are many possible causes of edema. Eating too much salt (sodium) and being on your feet or sitting for a long time can cause edema in your legs, feet, and ankles. Hot weather may make edema worse. Common causes of edema include: °Heart failure. °Liver or kidney disease. °Weak leg blood vessels. °Cancer. °An injury. °Pregnancy. °Medicines. °Being obese. °Low protein levels in the blood. °Edema is usually painless. Your skin may look swollen or shiny. °Follow these instructions at home: °Keep the affected body part raised (elevated) above the level of your heart when you are sitting or lying down. °Do not sit still or stand for long periods of time. °Do not wear tight clothing. Do not wear garters on your upper legs. °Exercise your legs to get your circulation going. This helps to move the fluid back into your blood vessels, and it may help the swelling go down. °Wear elastic bandages or support stockings to reduce swelling as told by your health care provider. °Eat a low-salt (low-sodium) diet to reduce fluid as told by your health care provider. °Depending on the cause of your swelling, you may need to limit how much fluid you drink (fluid restriction). °Take over-the-counter and prescription medicines only as told by your health care provider. °Contact a health care provider if: °Your edema does not get better with treatment. °You have heart, liver, or kidney disease and have symptoms of edema. °You have sudden and unexplained weight gain. °Get help right away if: °You develop shortness of breath or chest pain. °You  cannot breathe when you lie down. °You develop pain, redness, or warmth in the swollen areas. °You have heart, liver, or kidney disease and suddenly get edema. °You have a fever and your symptoms suddenly get worse. °Summary °Edema is an abnormal buildup of fluids in the body tissues and under the skin. °Eating too much salt (sodium) and being on your feet or sitting for a long time can cause edema in your legs, feet, and ankles. °Keep the affected body part raised (elevated) above the level of your heart when you are sitting or lying down. °This information is not intended to replace advice given to you by your health care provider. Make sure you discuss any questions you have with your health care provider. °Document Revised: 01/05/2021 Document Reviewed: 05/31/2020 °Elsevier Patient Education © 2022 [employer] ° °

## 2021-06-30 NOTE — Telephone Encounter (Signed)
FYI

## 2021-06-30 NOTE — Progress Notes (Signed)
Subjective:   By signing my name below, I, Tyler Brewer, attest that this documentation has been prepared under the direction and in the presence of  Tyler Schanz R DO. 06/30/2021     Patient ID: Tyler Brewer, male    DOB: 1961/02/21, 60 y.o.   MRN: [id number]  Chief Complaint  Patient presents with   Foot Swelling    Pt states swelling has gotten worse with pain     HPI Patient is in today for an office visit.  He reports that his feet have been swollen for the past 2 months.He sleeps with his feet above his bed. He had visited earlier for the swollen feet and tried various dosages of Lasix but mentions he was not tolerant to them. He is requesting a refill on 40 mg Lasix.  He also reports he is still experiencing anxiety attacks because of financial issues and the fact that he still has to depend on people for transportation. He mentions he has good and bad days and will like to see a psychiatrist. He is requesting for a refill on 25 mg vistaril.  He is still trying to find current regime for pain management but he does not like the doctors he has seen. He is doing IVIG treatments and mentions they provide great relief. He has one next month. He mentions he is doing better and is able to do things he could not do before. He is requesting a refill on 50 mg tramadol.  He also clarifies that he was never using meth for pain management. He was using it to lose weight because he was over 400 lbs but had to stop it because it was affecting his sleep.It did however help him lose weight. He does not think he has a problem with addiction and thinks he is very strong-willed. He tries to makes sure he is not in contact with people who use recreational drugs.  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[place])    Neuropathy    Obesity    Max weight was 390   OSA on CPAP    Panic attacks    Testosterone deficiency    Vertigo     Past Surgical History:  Procedure Laterality Date   CARDIAC CATHETERIZATION     IR FLUORO GUIDE CV LINE RIGHT  11/10/2020   IR US GUIDE VASC ACCESS RIGHT  11/10/2020   LEFT HEART CATHETERIZATION WITH CORONARY ANGIOGRAM N/A 03/31/2012   Procedure: LEFT HEART CATHETERIZATION WITH CORONARY ANGIOGRAM;  Surgeon: Leonie Man, MD;  Location: [facility] CATH LAB;  Service: Cardiovascular;  Laterality: N/A;    Family History  Problem Relation Age of Onset   Diabetes type II Father    Hypertension Father    Pancreatic disease Father 37       Deceased   Healthy Mother    Healthy Sister    Healthy Son    Healthy Daughter    Parkinson's disease Maternal Grandmother     Social History  Socioeconomic History   Marital status: Single    Spouse name: Not on file   Number of children: 2   Years of education: Not on file   Highest education level: Not on file  Occupational History   Not on file  Tobacco Use   Smoking status: Former    Packs/day: 0.50    Types: Cigarettes    Quit date: 04/27/2021    Years since quitting: 0.1   Smokeless tobacco: Never  Vaping Use   Vaping Use: Never used  Substance and Sexual Activity   Alcohol use: No   Drug use: Not Currently    Types: Methamphetamines   Sexual activity: Yes  Other Topics Concern   Not on file  Social History Narrative     Has 2 grown children.     Works as a Barrister's clerk.  Education: Oceanographer.   Right Handed   Drinks Caffeine    Mother recently moved and sold her house-- pt is living with a friend in a trailer    Social Determinants of  Health   Financial Resource Strain: Medium Risk   Difficulty of Paying Living Expenses: Somewhat hard  Food Insecurity: No Food Insecurity   Worried About Charity fundraiser in the Last Year: Never true   Arboriculturist in the Last Year: Never true  Transportation Needs: Public librarian (Medical): Yes   Lack of Transportation (Non-Medical): Yes  Physical Activity: Insufficiently Active   Days of Exercise per Week: 2 days   Minutes of Exercise per Session: 30 min  Stress: Not on file  Social Connections: Not on file  Intimate Partner Violence: Not on file    Outpatient Medications Prior to Visit  Medication Sig Dispense Refill   acetaminophen (TYLENOL) 500 MG tablet Take 500-1,000 mg by mouth every 6 (six) hours as needed for mild pain.     amLODipine (NORVASC) 10 MG tablet Take 1 tablet (10 mg total) by mouth daily. 90 tablet 1   COMFORT EZ INSULIN SYRINGE 31G X 5/16" 1 ML MISC      Cyanocobalamin (VITAMIN B-12 PO) Take 1 tablet by mouth daily.     diclofenac Sodium (VOLTAREN) 1 % GEL Apply 4 g topically 4 (four) times daily as needed (joint pain).     DULoxetine (CYMBALTA) 60 MG capsule Take 1 capsule by mouth daily.     gabapentin (NEURONTIN) 300 MG capsule Take 1,200 mg by mouth 3 (three) times daily.     hydrALAZINE (APRESOLINE) 50 MG tablet Take 1 tablet (50 mg total) by mouth every 8 (eight) hours. 270 tablet 0   ibuprofen (ADVIL) 200 MG tablet Take 400 mg by mouth every 6 (six) hours as needed for mild pain.     Immune Globulin 10% (PRIVIGEN) 5 GM/50ML SOLN Inject into the vein. Every 3 weeks     insulin NPH Human (NOVOLIN N) 100 UNIT/ML injection Inject 35 Units into the skin at bedtime.     Insulin Pen Needle 31G X 5 MM MISC 1 Device by Does not apply route in the morning, at noon, in the evening, and at bedtime. 400 each 2   Insulin Regular Human (HUMULIN Brewer) 100 UNIT/ML KwikPen Inject 14 Units into the skin 3 (three) times daily before  meals. Uses [employer] Relion Brewer insulin     losartan (COZAAR) 100 MG tablet TAKE ONE TABLET BY MOUTH DAILY 90 tablet 1   MAGNESIUM PO Take 1 tablet by  mouth daily.     meclizine (ANTIVERT) 25 MG tablet Take 1 tablet (25 mg total) by mouth 3 (three) times daily as needed for dizziness. 30 tablet 0   meloxicam (MOBIC) 15 MG tablet Take 1 tablet (15 mg total) by mouth daily. 30 tablet 1   Menthol, Topical Analgesic, (ICY HOT EX) Apply 1 application topically as needed (pain).     metFORMIN (GLUCOPHAGE-XR) 500 MG 24 hr tablet Take 2 tablets (1,000 mg total) by mouth in the morning and at bedtime. 360 tablet 1   methocarbamol (ROBAXIN) 500 MG tablet Take 500 mg by mouth 4 (four) times daily as needed for muscle spasms.     Multiple Vitamin (MULTIVITAMIN) tablet Take 1 tablet by mouth daily.     NONFORMULARY OR COMPOUNDED ITEM Pt requesting a non powered will hair. 1 each 0   NONFORMULARY OR COMPOUNDED ITEM Power wheelchair 1 each 0   pravastatin (PRAVACHOL) 40 MG tablet Take 1 tablet (40 mg total) by mouth daily. 90 tablet 1   tamsulosin (FLOMAX) 0.4 MG CAPS capsule TAKE ONE CAPSULE BY MOUTH ONE TIME DAILY 90 capsule 1   furosemide (LASIX) 20 MG tablet TAKE 1 TABLET(20 MG) BY MOUTH DAILY (Patient taking differently: Take 40 mg by mouth daily.) 90 tablet 0   traMADol (ULTRAM) 50 MG tablet 2 po q6 h prn (Patient taking differently: Take 100 mg by mouth every 6 (six) hours as needed for moderate pain.) 120 tablet 1   No facility-administered medications prior to visit.    No Known Allergies  Review of Systems  Constitutional:  Negative for fever.  HENT:  Negative for congestion, ear pain, hearing loss, sinus pain and sore throat.   Eyes:  Negative for blurred vision and pain.  Respiratory:  Negative for cough, sputum production, shortness of breath and wheezing.   Cardiovascular:  Negative for chest pain and palpitations.  Gastrointestinal:  Negative for blood in stool, constipation, diarrhea, nausea  and vomiting.  Genitourinary:  Negative for dysuria, frequency, hematuria and urgency.  Musculoskeletal:  Negative for back pain, falls and myalgias.  Neurological:  Negative for dizziness, sensory change, loss of consciousness, weakness and headaches.  Endo/Heme/Allergies:  Negative for environmental allergies. Does not bruise/bleed easily.  Psychiatric/Behavioral:  Negative for depression and suicidal ideas. The patient is not nervous/anxious and does not have insomnia.       Objective:    Physical Exam Constitutional:      General: He is not in acute distress.    Appearance: Normal appearance. He is not ill-appearing.  HENT:     Head: Normocephalic and atraumatic.     Right Ear: Tympanic membrane, ear canal and external ear normal.     Left Ear: Tympanic membrane, ear canal and external ear normal.  Eyes:     Pupils: Pupils are equal, round, and reactive to light.  Cardiovascular:     Rate and Rhythm: Normal rate and regular rhythm.     Pulses: Normal pulses.     Heart sounds: No murmur heard.   No gallop.  Pulmonary:     Effort: Pulmonary effort is normal. No respiratory distress.     Breath sounds: Normal breath sounds. No wheezing or rhonchi.  Abdominal:     General: Bowel sounds are normal. There is no distension.     Palpations: Abdomen is soft.     Tenderness: There is no abdominal tenderness. There is no guarding.     Hernia: No hernia is present.  Musculoskeletal:  Cervical back: Neck supple.  Lymphadenopathy:     Cervical: No cervical adenopathy.  Skin:    General: Skin is warm and dry.  Neurological:     Mental Status: He is alert and oriented to person, place, and time.    BP 140/90 (BP Location: Left Arm, Patient Position: Sitting, Cuff Size: Large)   Pulse 90   Temp 97.7 F (36.5 C) (Oral)   Resp 20   Ht 5\' 11"  (1.803 m)   SpO2 97%   BMI 40.45 kg/m  Wt Readings from Last 3 Encounters:  05/15/21 290 lb (131.5 kg)  05/12/21 290 lb (131.5 kg)   05/11/21 290 lb (131.5 kg)    Diabetic Foot Exam - Simple   No data filed    Lab Results  Component Value Date   WBC 6.3 05/18/2021   HGB 13.1 05/18/2021   HCT 38.3 (L) 05/18/2021   PLT 299.0 05/18/2021   GLUCOSE 211 (H) 05/18/2021   CHOL 140 05/18/2021   TRIG 104.0 05/18/2021   HDL 56.60 05/18/2021   LDLDIRECT 124.0 05/02/2017   LDLCALC 63 05/18/2021   ALT 25 05/18/2021   AST 11 05/18/2021   NA 133 (L) 05/18/2021   K 3.9 05/18/2021   CL 99 05/18/2021   CREATININE 0.87 05/18/2021   BUN 18 05/18/2021   CO2 24 05/18/2021   TSH 2.57 02/14/2021   PSA 1.15 03/10/2020   INR 1.1 07/30/2013   HGBA1C 7.5 (H) 05/18/2021   MICROALBUR <0.7 05/02/2017    Lab Results  Component Value Date   TSH 2.57 02/14/2021   Lab Results  Component Value Date   WBC 6.3 05/18/2021   HGB 13.1 05/18/2021   HCT 38.3 (L) 05/18/2021   MCV 86.7 05/18/2021   PLT 299.0 05/18/2021   Lab Results  Component Value Date   NA 133 (L) 05/18/2021   K 3.9 05/18/2021   CO2 24 05/18/2021   GLUCOSE 211 (H) 05/18/2021   BUN 18 05/18/2021   CREATININE 0.87 05/18/2021   BILITOT 0.3 05/18/2021   ALKPHOS 91 05/18/2021   AST 11 05/18/2021   ALT 25 05/18/2021   PROT 7.3 05/18/2021   ALBUMIN 3.9 05/18/2021   CALCIUM 9.1 05/18/2021   ANIONGAP 12 05/15/2021   GFR 95.44 05/18/2021   Lab Results  Component Value Date   CHOL 140 05/18/2021   Lab Results  Component Value Date   HDL 56.60 05/18/2021   Lab Results  Component Value Date   LDLCALC 63 05/18/2021   Lab Results  Component Value Date   TRIG 104.0 05/18/2021   Lab Results  Component Value Date   CHOLHDL 2 05/18/2021   Lab Results  Component Value Date   HGBA1C 7.5 (H) 05/18/2021       Assessment & Plan:   Problem List Items Addressed This Visit       Unprioritized   Chronic low back pain   Relevant Medications   traMADol (ULTRAM) 50 MG tablet   CIDP (chronic inflammatory demyelinating polyneuropathy) ([place])    Per neuro  and pain management  Pt will be getting an appointment with pain management next week      Relevant Medications   hydrOXYzine (ATARAX/VISTARIL) 25 MG tablet   Lower extremity edema - Primary    Increase lasix to 40 mg for a few days then decrease back to 1 Elevated legs F/u prn      Relevant Medications   furosemide (LASIX) 20 MG tablet   Other Visit Diagnoses  Depression with anxiety       Relevant Medications   hydrOXYzine (ATARAX/VISTARIL) 25 MG tablet       Meds ordered this encounter  Medications   traMADol (ULTRAM) 50 MG tablet    Sig: 2 po q6 h prn    Dispense:  120 tablet    Refill:  1   hydrOXYzine (ATARAX/VISTARIL) 25 MG tablet    Sig: Take 1 tablet (25 mg total) by mouth every 6 (six) hours as needed for anxiety.    Dispense:  30 tablet    Refill:  0   furosemide (LASIX) 20 MG tablet    Sig: Take 2 tablets (40 mg total) by mouth daily.    Dispense:  180 tablet    Refill:  1    **Patient requests 90 days supply**    I,Tyler Brewer,acting as a Education administrator for [employer], DO.,have documented all relevant documentation on the behalf of [name], DO,as directed by  [name], DO while in the presence of [name], DO.   I,  [name] DO. , personally preformed the services described in this documentation.  All medical record entries made by the scribe were at my direction and in my presence.  I have reviewed the chart and discharge instructions (if applicable) and agree that the record reflects my personal performance and is accurate and complete. 06/30/2021

## 2021-06-30 NOTE — Assessment & Plan Note (Signed)
Increase lasix to 40 mg for a few days then decrease back to 1 Elevated legs F/u prn

## 2021-06-30 NOTE — Assessment & Plan Note (Signed)
Per neuro and pain management  Pt will be getting an appointment with pain management next week

## 2021-07-04 ENCOUNTER — Other Ambulatory Visit: Payer: Self-pay | Admitting: Family Medicine

## 2021-07-04 ENCOUNTER — Ambulatory Visit: Payer: Medicare HMO | Admitting: *Deleted

## 2021-07-04 DIAGNOSIS — E0842 Diabetes mellitus due to underlying condition with diabetic polyneuropathy: Secondary | ICD-10-CM

## 2021-07-04 DIAGNOSIS — G6181 Chronic inflammatory demyelinating polyneuritis: Secondary | ICD-10-CM | POA: Diagnosis not present

## 2021-07-04 DIAGNOSIS — I10 Essential (primary) hypertension: Secondary | ICD-10-CM

## 2021-07-04 DIAGNOSIS — E1165 Type 2 diabetes mellitus with hyperglycemia: Secondary | ICD-10-CM

## 2021-07-04 DIAGNOSIS — G8929 Other chronic pain: Secondary | ICD-10-CM

## 2021-07-04 DIAGNOSIS — F418 Other specified anxiety disorders: Secondary | ICD-10-CM

## 2021-07-04 DIAGNOSIS — F410 Panic disorder [episodic paroxysmal anxiety] without agoraphobia: Secondary | ICD-10-CM

## 2021-07-04 DIAGNOSIS — F419 Anxiety disorder, unspecified: Secondary | ICD-10-CM

## 2021-07-04 DIAGNOSIS — R2689 Other abnormalities of gait and mobility: Secondary | ICD-10-CM

## 2021-07-04 DIAGNOSIS — M545 Low back pain, unspecified: Secondary | ICD-10-CM

## 2021-07-04 DIAGNOSIS — F32A Depression, unspecified: Secondary | ICD-10-CM

## 2021-07-04 DIAGNOSIS — R45851 Suicidal ideations: Secondary | ICD-10-CM

## 2021-07-04 DIAGNOSIS — R600 Localized edema: Secondary | ICD-10-CM

## 2021-07-04 DIAGNOSIS — E785 Hyperlipidemia, unspecified: Secondary | ICD-10-CM

## 2021-07-04 DIAGNOSIS — F1520 Other stimulant dependence, uncomplicated: Secondary | ICD-10-CM

## 2021-07-04 NOTE — Chronic Care Management (AMB) (Signed)
Chronic Care Management    Clinical Social Work Note  07/04/2021 Name: Tyler Brewer MRN: [id number] DOB: 12/23/1960  Tyler Brewer is a 60 y.o. year old male who is a primary care patient of [name], DO. The CCM team was consulted to assist the patient with chronic disease management and/or care coordination needs related to: [employer] and Level of Care Concerns.   Engaged with patient by telephone for follow-up visit in response to provider referral for social work chronic care management and care coordination services.   Consent to Services:  The patient was given the following information about Chronic Care Management services today, agreed to services, and gave verbal consent: 1. CCM service includes personalized support from designated clinical staff supervised by the primary care provider, including individualized plan of care and coordination with other care providers 2. 24/7 contact phone numbers for assistance for urgent and routine care needs. 3. Service will only be billed when office clinical staff spend 20 minutes or more in a month to coordinate care. 4. Only one practitioner may furnish and bill the service in a calendar month. 5.The patient may stop CCM services at any time (effective at the end of the month) by phone call to the office staff. 6. The patient will be responsible for cost sharing (co-pay) of up to 20% of the service fee (after annual deductible is met). Patient agreed to services and consent obtained.  Patient agreed to services and consent obtained.   Assessment: Review of patient past medical history, allergies, medications, and health status, including review of relevant consultants reports was performed today as part of a comprehensive evaluation and provision of chronic care management and care coordination services.     SDOH (Social Determinants of Health) assessments and interventions performed:    Advanced Directives Status: Not  addressed in this encounter.  CCM Care Plan  No Known Allergies  Outpatient Encounter Medications as of 07/04/2021  Medication Sig   acetaminophen (TYLENOL) 500 MG tablet Take 500-1,000 mg by mouth every 6 (six) hours as needed for mild pain.   amLODipine (NORVASC) 10 MG tablet Take 1 tablet (10 mg total) by mouth daily.   COMFORT EZ INSULIN SYRINGE 31G X 5/16" 1 ML MISC    Cyanocobalamin (VITAMIN B-12 PO) Take 1 tablet by mouth daily.   diclofenac Sodium (VOLTAREN) 1 % GEL Apply 4 g topically 4 (four) times daily as needed (joint pain).   DULoxetine (CYMBALTA) 60 MG capsule Take 1 capsule by mouth daily.   furosemide (LASIX) 20 MG tablet Take 2 tablets (40 mg total) by mouth daily.   gabapentin (NEURONTIN) 300 MG capsule Take 1,200 mg by mouth 3 (three) times daily.   hydrALAZINE (APRESOLINE) 50 MG tablet Take 1 tablet (50 mg total) by mouth every 8 (eight) hours.   hydrOXYzine (ATARAX/VISTARIL) 25 MG tablet Take 1 tablet (25 mg total) by mouth every 6 (six) hours as needed for anxiety.   ibuprofen (ADVIL) 200 MG tablet Take 400 mg by mouth every 6 (six) hours as needed for mild pain.   Immune Globulin 10% (PRIVIGEN) 5 GM/50ML SOLN Inject into the vein. Every 3 weeks   insulin NPH Human (NOVOLIN N) 100 UNIT/ML injection Inject 35 Units into the skin at bedtime.   Insulin Pen Needle 31G X 5 MM MISC 1 Device by Does not apply route in the morning, at noon, in the evening, and at bedtime.   Insulin Regular Human (HUMULIN R) 100 UNIT/ML KwikPen Inject 14  Units into the skin 3 (three) times daily before meals. Uses [employer] Rel[employer] R insulin   losartan (COZAAR) 100 MG tablet TAKE ONE TABLET BY MOUTH DAILY   MAGNESIUM PO Take 1 tablet by mouth daily.   meclizine (ANTIVERT) 25 MG tablet Take 1 tablet (25 mg total) by mouth 3 (three) times daily as needed for dizziness.   meloxicam (MOBIC) 15 MG tablet Take 1 tablet (15 mg total) by mouth daily.   Menthol, Topical Analgesic, (ICY HOT EX) Apply 1  application topically as needed (pain).   metFORMIN (GLUCOPHAGE-XR) 500 MG 24 hr tablet Take 2 tablets (1,000 mg total) by mouth in the morning and at bedtime.   methocarbamol (ROBAXIN) 500 MG tablet Take 500 mg by mouth 4 (four) times daily as needed for muscle spasms.   Multiple Vitamin (MULTIVITAMIN) tablet Take 1 tablet by mouth daily.   NONFORMULARY OR COMPOUNDED ITEM Pt requesting a non powered will hair.   NONFORMULARY OR COMPOUNDED ITEM Power wheelchair   pravastatin (PRAVACHOL) 40 MG tablet Take 1 tablet (40 mg total) by mouth daily.   tamsulosin (FLOMAX) 0.4 MG CAPS capsule TAKE ONE CAPSULE BY MOUTH ONE TIME DAILY   traMADol (ULTRAM) 50 MG tablet 2 po q6 h prn   No facility-administered encounter medications on file as of 07/04/2021.    Patient Active Problem List   Diagnosis Date Noted   CIDP (chronic inflammatory demyelinating polyneuropathy) ([place]) 01/12/2021   Diabetes mellitus ([place]) 01/03/2021   Type 2 diabetes mellitus with hyperglycemia, with long-term current use of insulin ([place]) 01/03/2021   Lower extremity edema 11/28/2020   Chronic low back pain 11/28/2020   Psoriatic arthritis ([place]) 11/28/2020   Thoracic aortic aneurysm without rupture 11/28/2020   Bilateral leg weakness 11/05/2020   Acute left-sided low back pain with right-sided sciatica 07/19/2020   Hip pain, acute, left 07/05/2020   Tremor 07/05/2020   Weakness 07/05/2020   Balance problem 03/11/2020   Tinea cruris 03/11/2020   Cellulitis of left groin 03/11/2020   Acute pain of right shoulder 03/11/2020   Interstitial lung disease ([place]) 05/02/2017   Pansinusitis 09/26/2016   Diabetic polyneuropathy associated with diabetes mellitus due to underlying condition ([place]) 05/08/2016   Methamphetamine use disorder, severe, dependence ([place]) 02/11/2016   Substance induced mood disorder ([place]) 02/11/2016   Exertional dyspnea 11/30/2015   ADD (attention deficit disorder) 09/29/2015   Binge eating 09/29/2015    Syncope 05/05/2012   Diarrhea 05/05/2012   Panic attacks 05/05/2012   OSA (obstructive sleep apnea) 05/05/2012   HTN (hypertension) 05/05/2012   Dyslipidemia 05/05/2012   CAD (coronary artery disease)  cardiac cath with moderate disease in a septal branch of the ramus intermedius 04/01/2012   Chest pain 04/01/2012   Drug abuse and dependence ([place]) 04/01/2012   Family history of coronary artery disease 03/31/2012   Sleep apnea, on C-pap 03/31/2012   Hyperlipemia 03/30/2012   HTN (hypertension), benign 03/30/2012   Morbid obesity ([place]) 03/30/2012   DM type 2, uncontrolled, with neuropathy ([place]) 03/30/2012   Depression with suicidal ideation 03/30/2012    Conditions to be addressed/monitored: HTN and DMII.  Film/video editor, Limited Social Support, Housing Barriers, Level of Care Concerns, ADL/IADL Limitations, Social Isolation, Limited Access to Building control surveyor, and Teacher, English as a foreign language of [employer].  Care Plan : LCSW Plan of Care  Updates made by Francis Gaines, LCSW since 07/04/2021 12:00 AM     Problem: Receive Long-Term Placement in an [place].   Priority: High  Long-Range Goal: Receive Long-Term Placement in an [place].   Start Date: 03/30/2021  Expected End Date: 08/04/2021  This Visit's Progress: On track  Recent Progress: On track  Priority: High  Note:   Current Barriers:   Patient with History of Falls/Fall Risk, Unsteady Gait, Impaired Mobility, Weakness, Tremor, Syncope, Balance Problem and Lower Extremity Edema needs assistance with initiating short-term rehabilitative services in a skilled nursing facility or long-term care in an assisted living facility. Clinical Goal(s):  Patient will receive assistance with initiating placement, whether it be assisted living, skilled level care, or a handicapped accessible apartment.   Interventions: Collaboration with Primary Care Physician, Dr. [name] regarding  development and update of comprehensive plan of care as evidenced by provider attestation and co-signature. Collaboration with Primary Care Physician, Dr. [name] to request order for diabetic footwear.  E-mailed patient resource information on handicapped accessible housing and apartments, as well as diabetic footwear. Patient Goals/Self-Care Activities:  Continue to receive home health services through [facility] [phone]), in the form of nursing, physical therapy, occupational therapy, speech therapy, social worker and bath aide. Continue to await response from [employer] [phone]), as to whether or not you have been approved to receive a power wheelchair. Review list of resources e-mailed, which includes handicapped accessible housing and apartments, as well as diabetic footwear. Please contact LCSW directly (# [phone]) if you have questions, need assistance, or if additional social work needs are identified between now and our next scheduled telephone outreach call. Follow-Up:  08/01/2021 at [place] [facility] Social Worker Den[place]dh[place]st [phone]

## 2021-07-04 NOTE — Patient Instructions (Addendum)
Visit Information  Current Barriers:   Patient with History of Falls/Fall Risk, Unsteady Gait, Impaired Mobility, Weakness, Tremor, Syncope, Balance Problem and Lower Extremity Edema needs assistance with initiating short-term rehabilitative services in a skilled nursing facility or long-term care in an assisted living facility. Clinical Goal(s):  Patient will receive assistance with initiating placement, whether it be assisted living, skilled level care, or a handicapped accessible apartment.   Interventions: Collaboration with Primary Care Physician, Dr. [name] regarding development and update of comprehensive plan of care as evidenced by provider attestation and co-signature. Collaboration with Primary Care Physician, Dr. [name] to request order for diabetic footwear.  E-mailed patient resource information on handicapped accessible housing and apartments, as well as diabetic footwear. Patient Goals/Self-Care Activities:  Continue to receive home health services through [facility] [phone]), in the form of nursing, physical therapy, occupational therapy, speech therapy, social worker and bath aide. Continue to await response from [employer] [phone]), as to whether or not you have been approved to receive a power wheelchair. Review list of resources e-mailed, which includes handicapped accessible housing and apartments, as well as diabetic footwear. Please contact LCSW directly (# [phone]) if you have questions, need assistance, or if additional social work needs are identified between now and our next scheduled telephone outreach call. Follow-Up:  08/01/2021 at 9:00am  Patient verbalizes understanding of instructions provided today and agrees to view in [place].   Telephone follow up appointment with care management team member scheduled for:  08/01/2021 at [place] Clinical Social  Worker [place] [phone]

## 2021-07-05 ENCOUNTER — Ambulatory Visit: Payer: Medicare HMO | Admitting: Pharmacist

## 2021-07-05 DIAGNOSIS — Z794 Long term (current) use of insulin: Secondary | ICD-10-CM

## 2021-07-05 DIAGNOSIS — E785 Hyperlipidemia, unspecified: Secondary | ICD-10-CM

## 2021-07-05 DIAGNOSIS — E1165 Type 2 diabetes mellitus with hyperglycemia: Secondary | ICD-10-CM

## 2021-07-05 DIAGNOSIS — G6181 Chronic inflammatory demyelinating polyneuritis: Secondary | ICD-10-CM | POA: Diagnosis not present

## 2021-07-05 DIAGNOSIS — Z5982 Transportation insecurity: Secondary | ICD-10-CM | POA: Diagnosis not present

## 2021-07-05 DIAGNOSIS — I10 Essential (primary) hypertension: Secondary | ICD-10-CM

## 2021-07-05 DIAGNOSIS — E0842 Diabetes mellitus due to underlying condition with diabetic polyneuropathy: Secondary | ICD-10-CM

## 2021-07-05 NOTE — Chronic Care Management (AMB) (Signed)
Chronic Care Management Pharmacy Note  07/05/2021 Name:  Tyler Brewer MRN:  [id number] DOB:  1961/07/30  Subjective: Tyler Brewer is an 60 y.o. year old male who is a primary patient of [name], DO.  The CCM team was consulted for assistance with disease management and care coordination needs.    Engaged with patient by telephone for  follow up   in response to provider referral for pharmacy case management and/or care coordination services.   Consent to Services:  The patient was given information about Chronic Care Management services, agreed to services, and gave verbal consent prior to initiation of services.  Please see initial visit note for detailed documentation.   Patient Care Team: Carollee Herter, Alferd Apa, DO as PCP - General (Family Medicine) Alda Berthold, DO as Consulting Physician (Neurology) Lajuana Matte, MD as Consulting Physician (Cardiothoracic Surgery) Marlaine Hind, MD as Consulting Physician (Physical Medicine and Rehabilitation) Luretha Rued, RN as Case Manager Cherre Robins, RPH-CPP (Pharmacist) [name], Melanie Crazier, MD as Consulting Physician (Endocrinology) Saporito, Maree Erie, LCSW as Social Worker (Licensed Clinical Social Worker) Haddix, Thomasene Lot, MD as Consulting Physician (Orthopedic Surgery)  Recent office visits: 06/30/2021 - Fam Med (Dr Etter Sjogren Cheri Rous) Seen for edema / pain / anxiety. Furosemide 69m for a few days then back to 262mdaily; Refilled hydroxyzine 2544mp to every 6 hours as needed. Refilled tramadol. Has appt to see pain management soon 05/18/2021 - [facility] ([facility]. Noted to have increased anxiety, panic attackd and insomnia. Referred to pulmonology for OSA. Referral to urology for testicle pain. Recommended f/u with psychiatry 01/12/2021 - PCP (Dr LowEtter Sjogren/U visit; recommended COVID vaccine and colonoscopy; no medication changes.    Recent consult visits: 06/27/2021 -  Neurology (Dr McGMikle Boswortheuropathy and worsening lower extremity weakness. continue IVIG 1 g/kg q 3 weeks, given over 2 days in 5 CGertonlucose elevated today, recommend strict glycemic control as hyperglycemia can worsen underlying neuropathy. CMP reviewed, Cr WNL, glucose elevated, Na slightly low in the setting of hyperglycemia. CBC unremarkable -Continue Gabapentin and Duloxetine for neuropathic pain 07/04/2021 and 11/16/20222 - CIPD infusion of IVIG 06/13/2021 and 06/14/2021- [facility] infusion of IVIG [facility]2021 [facility].  03/27/2021 - [place] ([place]08/03/2021 - Neurology - (Dr PatPosey Prontoolyrediculoneuropathy. No medication changes.   03/24/2021 - [facility]. Repeat EMG/NCS to assess for diabetic amyotrophy versus demyelinating process. Labs checked. No med changes. F/U 3 months.   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 No medication changes. Recommended optimize control of DM and physical therapy to improve strength.   01/03/2021 - Endo (Dr ShaKelton Pillarnitial consult for insulin dependent type 2 DM. Started Metformin 1 tablet daily with Breakfast for 1 week, then increase to 1 tablet with Breakfast and 1 tablet with Supper for  1 week, then increase to 2 tablets with Breakfast  and 1 tablet with Supper for another 1 week , then finally 2 Tablets with Breakfast and 2 tablets with Supper.  Increased Tresiba to 45 units daily. Increase Novolog to 12 units with each meal     Hospital visits: 06/24/2021 - ED - [facility] - for edema per EMS report. Unable to see notes from ED visit.  05/14/2021 - ED Visit - Seen for constipation. Recommended Miralax as needed.  05/12/2021 ED Visit - Seen for lumbar strain and contusion of hip and thigh.  05/11/2021 - ED Visit - fall with fracture of medial  malleolus of left tibia and of proximal phalanx of left great toe.  04/26/2021 to 05/05/2021 hospital admission at [facility] for worsening weakness and falls. Received 5 days of IVIG treatment with gradual improvement in lower extremity weakness.  Medications started at discharge:  IVIG every 2 weeks, duloxetine 17m daily Medications changed at discharge:  gabapentin 3021m- take 4 caps = 120087m times a day; Novolin N - 20 units daily at bedtime; Novolin R 7 units 3 times a day before meals. Losartan 100m91mily. Tramadol 50g every 6 hours as needed for pain for up to 14 days.  Medications stopped at discharge:  ciprofloxacin, hydralazine, hydrocodone/APAP, Novolog Mix 70/30; nicotine 21mg36mches; nystatin powder, pregabalin 75mg,53mtra DS; TresibTyler Aas1/2022 - [facility]. Performed scrotal U/S to r/o epididymitis or other pathology. Only showed small bilateral hydroceles. No med changes.  [facility]2021 [facility] Noted also to have urinary retention. MRI showed no changes or spinal cord compression. GIven tramadol, lorazepam and meloxicam in hospital. Patient was noted to be upset at dicharge because he wanted to be admitted. 03/16/2021 - ED Visit - for neuropathy - no med changes.  01/23/2021 - ED Visit - acute pain in left knee given oxycodone / APAP for 1 dose and IM toradol 01/21/2021 - Ed Visit - left without being seen ([facility] 01/20/2021 - ED Visit - abdominal pain; GIven GI cocktail with improved symptoms. No change to medication regimen 01/16/2021 - ED Visit - leg pain; Given ketorolac 30mg I21mxycodone 10mg x 25mse; prednisone 60mg x 153me and Rx to start methylprednisolone dose pack   Objective:  Lab Results  Component Value Date   CREATININE 0.87 05/18/2021   CREATININE 1.05 05/15/2021   CREATININE 0.70 04/20/2021    Lab Results  Component Value  Date   HGBA1C 7.5 (H) 05/18/2021   Last diabetic Eye exam: No results found for: HMDIABEYEEXA  Last diabetic Foot exam: No results found for: HMDIABFOOTEX      Component Value Date/Time   CHOL 140 05/18/2021 1612   TRIG 104.0 05/18/2021 1612   HDL 56.60 05/18/2021 1612   CHOLHDL 2 05/18/2021 1612   VLDL 20.8 05/18/2021 1612   LDLCALC 63 05/18/2021 1612   LDLDIRECT 124.0 05/02/2017 1507    Hepatic Function Latest Ref Rng & Units 05/18/2021 05/15/2021 04/20/2021  Total Protein 6.0 - 8.3 g/dL 7.3 8.1 6.6  Albumin 3.5 - 5.2 g/dL 3.9 3.7 3.1(L)  AST 0 - 37 U/L _0 ALT 0 - 53 U/L _1 Alk Phosphatase 39 - 117 U/L 91 66 78  Total Bilirubin 0.2 - 1.2 mg/dL 0.3 0.6 1.1    Lab Results  Component Value Date/Time   TSH 2.57 02/14/2021 02:19 PM   TSH 2.948 11/05/2020 07:18 AM   TSH 2.66 03/10/2020 10:06 AM    CBC Latest Ref Rng & Units 05/18/2021 05/15/2021 04/20/2021  WBC 4.0 - 10.5 K/uL 6.3 6.6 5.8  Hemoglobin 13.0 - 17.0 g/dL 13.1 13.7 12.1(L)  Hematocrit 39.0 - 52.0 % 38.3(L) 41.3 36.4(L)  Platelets 150.0 - 400.0 K/uL 299.0 268 300    No results found for: VD25OH  Clinical ASCVD: Yes  The 10-year ASCVD  risk score (Arnett DK, et al., 2019) is: 11.4%   Values used to calculate the score:     Age: 63 years     Sex: Male     Is Non-Hispanic African American: No     Diabetic: Yes     Tobacco smoker: No     Systolic Blood Pressure: 270 mmHg     Is BP treated: Yes     HDL Cholesterol: 56.6 mg/dL     Total Cholesterol: 140 mg/dL     Social History   Tobacco Use  Smoking Status Former   Packs/day: 0.50   Types: Cigarettes   Quit date: 04/27/2021   Years since quitting: 0.1  Smokeless Tobacco Never   BP Readings from Last 3 Encounters:  06/30/21 140/90  05/18/21 (!) 168/101  05/15/21 100/84   Pulse Readings from Last 3 Encounters:  06/30/21 90  05/18/21 89  05/15/21 91   Wt Readings from Last 3 Encounters:  05/15/21 290 lb (131.5 kg)  05/12/21 290 lb (131.5  kg)  05/11/21 290 lb (131.5 kg)    Assessment: Review of patient past medical history, allergies, medications, health status, including review of consultants reports, laboratory and other test data, was performed as part of comprehensive evaluation and provision of chronic care management services.   SDOH:  (Social Determinants of Health) assessments and interventions performed:  SDOH Interventions    Flowsheet Row Most Recent Value  SDOH Interventions   Transportation Interventions [facility]  [patient has been getting transportation services. Has missed fewer appointment since care coordination intervened.]       CCM Care Plan  No Known Allergies  Medications Reviewed Today     Reviewed by Cherre Robins, RPH-CPP (Pharmacist) on 07/05/21 at 1544  Med List Status: <None>   Medication Order Taking? Sig Documenting Provider Last Dose Status Informant  acetaminophen (TYLENOL) 500 MG tablet [id number] Yes Take 500-1,000 mg by mouth every 6 (six) hours as needed for mild pain. [provider] Taking Active Self  amLODipine (NORVASC) 10 MG tablet [id number] Yes Take 1 tablet (10 mg total) by mouth daily. [name], DO Taking Active   COMFORT EZ INSULIN SYRINGE 31G X 5/16" 1 ML MISC [id number] Yes  [provider] Taking Active Self  Cyanocobalamin (VITAMIN B-12 PO) [id number] Yes Take 1 tablet by mouth daily. [provider] Taking Active Self  diclofenac Sodium (VOLTAREN) 1 % GEL [id number] Yes Apply 4 g topically 4 (four) times daily as needed (joint pain). [provider] Taking Active Self  DULoxetine (CYMBALTA) 60 MG capsule [id number] Yes Take 1 capsule by mouth daily. [provider] Taking Active Self  furosemide (LASIX) 20 MG tablet [id number] Yes Take 2 tablets (40 mg total) by mouth daily. [name], DO Taking Active   gabapentin (NEURONTIN) 300 MG capsule [id number] Yes Take 1,200 mg by mouth 3  (three) times daily. [provider] Taking Active   hydrALAZINE (APRESOLINE) 50 MG tablet [id number] Yes Take 1 tablet (50 mg total) by mouth every 8 (eight) hours. [name], DO Taking Active Self  hydrOXYzine (ATARAX/VISTARIL) 25 MG tablet [id number] Yes Take 1 tablet (25 mg total) by mouth every 6 (six) hours as needed for anxiety. [name] R, DO Taking Active   ibuprofen (ADVIL) 200 MG tablet [id number] No Take 400 mg by mouth every 6 (six) hours as needed for mild pain.  Patient not taking: Reported on 07/05/2021   [provider]  Not Taking Active Self  immune globulin, human, (GAMMAGARD S/D) 5 g injection [id number] Yes Inject into the vein every 21 ( twenty-one) days. [provider] Taking Active   insulin NPH Human (NOVOLIN N) 100 UNIT/ML injection [id number] Yes Inject 35 Units into the skin at bedtime. [provider] Taking Active Self  Insulin Pen Needle 31G X 5 MM MISC [id number] Yes 1 Device by Does not apply route in the morning, at noon, in the evening, and at bedtime. [name], Melanie Crazier, MD Taking Active Self  Insulin Regular Human (HUMULIN R) 100 UNIT/ML KwikPen [id number] Yes Inject 14 Units into the skin 3 (three) times daily before meals. Uses [employer] Relion R insulin [provider] Taking Active   losartan (COZAAR) 100 MG tablet [id number] Yes TAKE ONE TABLET BY MOUTH DAILY [name], DO Taking Active   MAGNESIUM PO [id number] Yes Take 1 tablet by mouth daily. [provider] Taking Active   meclizine (ANTIVERT) 25 MG tablet [id number] Yes Take 1 tablet (25 mg total) by mouth 3 (three) times daily as needed for dizziness. Molpus, John, MD Taking Active            Med Note Thayer Ohm, Debria Garret Apr 20, 2021  2:54 PM)    meloxicam (MOBIC) 15 MG tablet [id number] Yes Take 1 tablet (15 mg total) by mouth daily. Charlett Blake, MD Taking Active   Menthol, Topical Analgesic, (ICY HOT EX)  [id number] Yes Apply 1 application topically as needed (pain). [provider] Taking Active Self  metFORMIN (GLUCOPHAGE-XR) 500 MG 24 hr tablet [id number] Yes Take 2 tablets (1,000 mg total) by mouth in the morning and at bedtime. [name], DO Taking Active Self  methocarbamol (ROBAXIN) 500 MG tablet [id number] Yes Take 500 mg by mouth 4 (four) times daily as needed for muscle spasms. [provider] Taking Active   Multiple Vitamin (MULTIVITAMIN) tablet [id number] Yes Take 1 tablet by mouth daily. [provider] Taking Active Self  NONFORMULARY OR COMPOUNDED ITEM [id number] Yes Pt requesting a non powered will hair.  Patient taking differently: Pt requesting a non powered will chair.   [name], DO Taking Active   NONFORMULARY OR COMPOUNDED ITEM [id number] Yes Power wheelchair Carollee Herter, Alferd Apa, [place] Taking Active   pravastatin (PRAVACHOL) 40 MG tablet [id number] Yes Take 1 tablet (40 mg total) by mouth daily. [name] R, DO Taking Active Self  tamsulosin (FLOMAX) 0.4 MG CAPS capsule [id number] Yes TAKE ONE CAPSULE BY MOUTH ONE TIME DAILY [name], DO Taking Active   traMADol (ULTRAM) 50 MG tablet [id number] Yes 2 po q6 h prn  Patient taking differently: Take 100 mg by mouth every 6 (six) hours as needed. 2 po q6 h prn   [name], DO Taking Active             Patient Active Problem List   Diagnosis Date Noted   CIDP (chronic inflammatory demyelinating polyneuropathy) ([place]) 01/12/2021   Diabetes mellitus ([place]) 01/03/2021   Type 2 diabetes mellitus with hyperglycemia, with long-term current use of insulin ([place]) 01/03/2021   Lower extremity edema 11/28/2020   Chronic low back pain 11/28/2020   Psoriatic arthritis ([place]) 11/28/2020   Thoracic aortic aneurysm without rupture 11/28/2020   Bilateral leg weakness 11/05/2020   Acute left-sided low back pain with right-sided sciatica 07/19/2020   Hip pain,  acute, left 07/05/2020   Tremor 07/05/2020  Weakness 07/05/2020   Balance problem 03/11/2020   Tinea cruris 03/11/2020   Cellulitis of left groin 03/11/2020   Acute pain of right shoulder 03/11/2020   Interstitial lung disease ([place]) 05/02/2017   Pansinusitis 09/26/2016   Diabetic polyneuropathy associated with diabetes mellitus due to underlying condition ([place]) 05/08/2016   Methamphetamine use disorder, severe, dependence ([place]) 02/11/2016   Substance induced mood disorder ([place]) 02/11/2016   Exertional dyspnea 11/30/2015   ADD (attention deficit disorder) 09/29/2015   Binge eating 09/29/2015   Syncope 05/05/2012   Diarrhea 05/05/2012   Panic attacks 05/05/2012   OSA (obstructive sleep apnea) 05/05/2012   HTN (hypertension) 05/05/2012   Dyslipidemia 05/05/2012   CAD (coronary artery disease)  cardiac cath with moderate disease in a septal branch of the ramus intermedius 04/01/2012   Chest pain 04/01/2012   Drug abuse and dependence ([place]) 04/01/2012   Family history of coronary artery disease 03/31/2012   Sleep apnea, on C-pap 03/31/2012   Hyperlipemia 03/30/2012   HTN (hypertension), benign 03/30/2012   Morbid obesity ([place]) 03/30/2012   DM type 2, uncontrolled, with neuropathy ([place]) 03/30/2012   Depression with suicidal ideation 03/30/2012    Immunization History  Administered Date(s) Administered   Tdap 08/20/2014    Conditions to be addressed/monitored: CAD, HTN, HLD, DMII and chronic pain (low back) interstitial lung disease; OSA  Care Plan : General Pharmacy (Adult)  Updates made by Cherre Robins, RPH-CPP since 07/05/2021 12:00 AM     Problem: Medication management and Chronic Care Management, support and education for the following conditions - HTN, Type2 DM with long term insuiln use; hyperlipidemia; neuropathy; OSA; institial lung disease;   Priority: High  Onset Date: 01/06/2021  Note:   Current Barriers:  Difficulty with affording treatment regimen  -improved Unable to independently monitor therapeutic efficacy - improved Unable to achieve control of type 2 DM and HTN - improved Does not adhere to prescribed medication regimen - improved Frequent ED visits - improved  Pharmacist Clinical Goal(s):  Over the next 45 days, patient will verbalize ability to afford treatment regimen achieve adherence to monitoring guidelines and medication adherence to achieve therapeutic efficacy achieve control of type 2 DM and HTN as evidenced by attainment of goals listed below Decrease frequency of ED visits  through collaboration with PharmD and provider.   Interventions: 1:1 collaboration with Carollee Herter, Alferd Apa, DO regarding development and update of comprehensive plan of care as evidenced by provider attestation and co-signature Inter-disciplinary care team collaboration (see longitudinal plan of care) Comprehensive medication review performed; medication list updated in electronic medical record   Interventions: 1:1 collaboration with Carollee Herter, Alferd Apa, DO regarding development and update of comprehensive plan of care as evidenced by provider attestation and co-signature Inter-disciplinary care team collaboration (see longitudinal plan of care) Comprehensive medication review performed; medication list updated in electronic medical record  Diabetes: Lab Results  Component Value Date   HGBA1C 7.5 (H) 05/18/2021  A1c 03/23/2021 at [facility] was 6.9%  Controlled - (goal A1c <7%) Much improved BG since started using Libre CGM however patient did have a blood glucose of 320 on CMP checked at [facility]. Patient states this is rare - he had eaten lunch prior to appointment but has forgotten mealtime insulin at home.  Reports recent blood glucose at home has been 70's to 150's most days. Current treatment: Relion NPH insulin- inject 35 units at bedtime Relion Regular insulin - inject 14 units prior to each meal.   Metformin XR 565m -  2  Tablets with Breakfast and 2 tablets with Supper (last refilled 04/20/2021 for 90 DS)  Interventions / Recommendations Recommended continue to take medications as directed by Dr [name] Continue to check BG frequently during day with [facility]. Plan to consider restarting Tyler Aas / Novolog in 2023 when copay will be $35/30 days the full year of insulin therapy (no coverage gap)   Hypertension: BP Readings from Last 3 Encounters:  06/30/21 140/90  05/18/21 (!) 168/101  05/15/21 100/84  Blood pressure at goal recently - BP goal <140/90 Not checking BP at home regularly Current treatment: Amlodipine 29m daily  Hydralazine 515mevery 8 hours Losartan 10028maily  Denies dizziness  Interventions:  Counseled on importance of maintaining blood pressure at goal to prevent strokes and kidney damage Continue current blood pressure lowering regimen   Hyperlipidemia / Heart Disease: Lipid Panel     Component Value Date/Time   CHOL 118 11/28/2020 1502   TRIG 130.0 11/28/2020 1502   HDL 40.00 11/28/2020 1502   CHOLHDL 3 11/28/2020 1502   VLDL 26.0 11/28/2020 1502   LDLCALC 52 11/28/2020 1502  Controlled - LDL goal <70 Current treatment: Pravastatin 61m2m bedtime Interventions:  Counseled on LDL goals Recommended continue current regimen for cholesterol  Chronic Pain / Leg Weakness / CIDP: Currently not controlled but improving per patient since started IVIG treatment Recent IVIG treatment for CIDP increased blood pressure. In future will receive treatment over 2 days every 30 days.  Patient reports he was doing better until he fell and fractured his foot.  Current treatment:  Methocarbamol 500mg47make 1 tablet 4 times a day as needed for muscle spasms Voltaren Gel 1% -  apply to area of pain up to 4 times a day as needed Gabapentin 300mg 14mke 4 tablets = 1200mg 352mes a day (increased during recent hospitalization) Duloxetine 60mg da93min morning IVIG infusion every  30 days over 2 days Patient states that gabapentin was increased to 1200mg 4 t6m a day by Dr McGhee buMikle Bosworthn't see report of that change. He [facility] and [facility]/27/21 was for 1200mg 3 ti48ma day.  Interventions:  Continue current regimen for pain  Encouraged patient to follow up with neurology and pain management [facility] to check with Dr Mcghee regMikle Bosworth if he should take 3 times a day or 4 times  a day.   Anxiety / Insomnia:  Uncontrolled Current therapy: none Medication tried in past:  Trazodone - made him feel like a zombie Hydroxyzine - made him sleepy ("like a benadryl on steroids") Ativan - worked well Citalopram - not helpful Clonazepam - made him moody Escitalopram - not helpful Nortriptyline - made him sleep Interventions: (addressed at previous visit) Continue to take duloxetine in morning.  Patient has been referred to pulmonology for OSA testing and also referred back to psychiatry by PCP.   Health Maintenance:  Reviewed vaccination history and discussed benefits of annual flu, COVID booster and Shingrix vaccinations Patient declined flu vaccine He is considering COVID booster Plans to get Shingrix in 2023 when coverage thru Medicare is expected to improve.   Medication Management:  Reviewed refill history. Only see that he is due to refill amlodipine  Interventions:   Will continue to follow adherence and assist patient with refills.  Reminded patient that he needs to answer calls for regular  check ins so that we can order / request refills BEFORE he runs out of medication.  Next mail order due around 07/19/2021. I called [facility] and was able to request refills for the 4 meds that they fill - pravastatin, tamsulosin, losartan and metformin.   Patient Goals/Self-Care Activities Over the next 90 days, patient will:  take  medications as prescribed focus on medication adherence by utilizing pharmacy service that delivers medications to his home, check glucose before meals and 2 hour after meals using CGM, document, and provide at future appointments collaborate with provider on medication access solutions and refills   Follow up call planned In 4 weeks      Medication Assistance: None needed at this time .  Patient's preferred pharmacy is:  [id number] - [place], [place] - [address] AT [address] [place] [address] Phone: [phone] Fax: [phone]  [facility] [place] [place] [facility] [address] [address] [place] [address] Phone: [phone] Fax: [phone]   Follow Up:  Patient agrees to Care Plan and Follow-up.  Plan: The care management team will reach out to the patient again in 4 weeks.   Cherre Robins, PharmD Clinical Pharmacist [facility] [phone]

## 2021-07-05 NOTE — Patient Instructions (Signed)
Visit Information    Diabetes: Lab Results  Component Value Date   HGBA1C 7.7 (H) 02/14/2021   A1c 03/23/2021 at [facility] was 6.9%  Controlled - (goal A1c <7%) Much improved BG since started using Libre and taking insuiln regularly States cost of insulin still difficult but he cannot find application I sent for patient assistnace.  Current treatment: Relion NPH insulin- inject 35 units at bedtime Relion Regular insulin - inject 14 units prior to each meal.   Metformin XR 500mg  - 2 Tablets with Breakfast and 2 tablets with Supper (last refilled 04/20/2021 for 90 DS)  Interventions / Recommendations Recommended continue to take medications as directed by Dr [name] Continue to check BG frequently during day with [facility].. Plan to consider restarting Tyler Aas / Novolog in 2023 when copay will be $35/30 days the full year of insulin therapy (no coverage gap)   Hypertension: BP Readings from Last 3 Encounters:  06/30/21 140/90  05/18/21 (!) 168/101  05/15/21 100/84   Blood pressure not consistently at goal - BP goal <140/90 Current treatment: Amlodipine 10mg  daily  Hydralazine 50mg  every 8 hours Losartan 100mg  daily  Interventions: (addressed at previous visits)  Counseled on importance of getting blood pressure to goal to prevent strokes and kidney damage Reviewed refill history.  Continue current blood pressure lowering regimen   Hyperlipidemia / Heart Disease: Lipid Panel     Component Value Date/Time   CHOL 118 11/28/2020 1502   TRIG 130.0 11/28/2020 1502   HDL 40.00 11/28/2020 1502   CHOLHDL 3 11/28/2020 1502   VLDL 26.0 11/28/2020 1502   LDLCALC 52 11/28/2020 1502   Controlled - LDL goal <70 Current treatment: Pravastatin 40mg  at bedtime Interventions:  Counseled on LDL goals Recommended continue current regimen for cholesterol  Chronic Pain / Leg Weakness: Currently not controlled Patient has had several ED visits for pain and weakness in the  last month. Has appointment with pain management 02/16/2021. Current treatment:  Tramadol 50mg  - take 1 or 2 tablets up to every 8 hours as needed Methocarbamol 500mg  - take 1 tablet 4 times a day as needed for muscle spasms Voltaren Gel 1% -  apply to area of pain up to 4 times a day as needed Gabapentin 300mg  - take 4 tablets = 1200mg  3 times a day (increased during recent hospitalization) Duloxetine 60mg  at night Interventions:  Continue current regimen for pain  Encouraged patient to follow up with neurology and pain management Recommended change duloxetine to take in morning to see if change helps with sleep Check with Dr Mikle Bosworth about gabapentin dose.   Health Maintenance:  Reviewed vaccination history and discussed benefits of annual flu, COVID booster and Shingrix vaccinations Patient declined flu vaccine He is considering COVID booster Plans to get Shingrix in 2023 when coverage thru Medicare is expected to improve.   Medication Management:  Reviewed refill history.  Current Pharmacy: [facility] Interventions:   Will continue to follow adherence and assist patient with refills.   Next mail order due around 07/19/2021. Coordinated with [facility] to fill pravastatin, tamsulosin, metformin and losartan.   Patient Goals/Self-Care Activities Over the next 90 days, patient will:  take medications as prescribed focus on medication adherence by utilizing pharmacy service that delivers medications to his home, check glucose before meals and 2 hour after meals using CGM, document, and provide at future appointments collaborate with provider on medication access solutions   Follow up call planned in  4 weeks  Patient  verbalizes understanding of instructions provided today and agrees to view in [place].     Cherre Robins, PharmD Clinical Pharmacist [facility]

## 2021-07-06 ENCOUNTER — Encounter: Payer: Self-pay | Admitting: Family Medicine

## 2021-07-11 ENCOUNTER — Other Ambulatory Visit: Payer: Self-pay

## 2021-07-11 ENCOUNTER — Ambulatory Visit (INDEPENDENT_AMBULATORY_CARE_PROVIDER_SITE_OTHER): Payer: Medicare HMO | Admitting: Internal Medicine

## 2021-07-11 ENCOUNTER — Encounter: Payer: Self-pay | Admitting: Internal Medicine

## 2021-07-11 VITALS — BP 144/86 | HR 97 | Ht 71.0 in

## 2021-07-11 DIAGNOSIS — E1165 Type 2 diabetes mellitus with hyperglycemia: Secondary | ICD-10-CM

## 2021-07-11 DIAGNOSIS — E785 Hyperlipidemia, unspecified: Secondary | ICD-10-CM | POA: Diagnosis not present

## 2021-07-11 DIAGNOSIS — Z794 Long term (current) use of insulin: Secondary | ICD-10-CM | POA: Diagnosis not present

## 2021-07-11 DIAGNOSIS — E1159 Type 2 diabetes mellitus with other circulatory complications: Secondary | ICD-10-CM

## 2021-07-11 NOTE — Progress Notes (Signed)
Name: Billyjoe Go  MRN/ DOB: [id number], May 01, 1961   Age/ Sex: 60 y.o., male    PCP: Carollee Herter, Alferd Apa, DO   Reason for Endocrinology Evaluation: Type 2 Diabetes Mellitus     Date of Initial Endocrinology Visit: 01/03/2021    PATIENT IDENTIFIER: Tyler Brewer is a 60 y.o. male with a past medical history of T2DM, Dyslipidemia, CAD, OSA and chronic back pain,chronic inflammatory demyelinating polyneuropathy (CIPD),psoriatic arthritis   . The patient presented for initial endocrinology clinic visit on 01/03/2021 for consultative assistance with his diabetes management.     DIABETIC HISTORY:  Tyler Brewer was diagnosed with DM in 2005, , he reports NO intolerance to Metformin . His hemoglobin A1c has ranged from 7.3% in 2017, peaking at 11.5% in 2021.   SUBJECTIVE:   During the last visit (01/03/2021): A1c 9.9%, adjusted MDI regimen and continued Metformin      Today (07/11/21): Tyler Brewer is here for a follow up on diabetes management. He  checks his blood sugars multiple times daily, through CGM . The patient has had hypoglycemic episodes since the last clinic visit   He was diagnosed with CIPD and on IVIG infusions  He feels much better   He snacks at bedtime to prevent hypoglycemia   HOME DIABETES REGIMEN: NPH 35 units  Humulin -R 14 units with each meal      Statin: yes ACE-I/ARB: yes Prior Diabetic Education: yes   CONTINUOUS GLUCOSE MONITORING RECORD INTERPRETATION    Dates of Recording: 11/8-11/21/2022  Sensor description:freestyle libre  Results statistics:   CGM use % of time 57  Average and SD 197/29.7  Time in range   43     %  % Time Above 180 40  % Time above 250 17  % Time Below target 0      Glycemic patterns summary: Bg's optimal at night, hyperglycemia noted during the day   Hyperglycemic episodes  post prandial   Hypoglycemic episodes occurred n/a  Overnight periods: trends down      DIABETIC  COMPLICATIONS: Microvascular complications:  Neuropathy Denies: CKD  Last eye exam: Completed > 2 yrs   Macrovascular complications:  CAD Denies:  PVD, CVA     PAST HISTORY: Past Medical History:  Past Medical History:  Diagnosis Date   Arthritis    CAD (coronary artery disease)  cardiac cath with moderate disease in a septal branch of the ramus intermedius 04/01/2012   CIDP (chronic inflammatory demyelinating polyneuropathy) ([place])    Depression    Diabetes mellitus    poorly controlled by his report   History of narcotic addiction ([place])    past history of back pain   Hypercholesteremia    Hypertension    IBS (irritable bowel syndrome)    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[place])    Neuropathy    Obesity    Max weight was 390   OSA on CPAP    Panic attacks    Testosterone deficiency    Vertigo    Past Surgical History:  Past Surgical History:  Procedure Laterality Date   CARDIAC CATHETERIZATION     IR FLUORO GUIDE CV LINE RIGHT  11/10/2020   IR US GUIDE VASC ACCESS RIGHT  11/10/2020   LEFT HEART CATHETERIZATION WITH CORONARY ANGIOGRAM N/A 03/31/2012   Procedure: LEFT HEART CATHETERIZATION WITH CORONARY ANGIOGRAM;  Surgeon: Leonie Man, MD;  Location: [facility] CATH LAB;  Service: Cardiovascular;  Laterality: N/A;    Social History:  reports that he quit smoking about  2 months ago. His smoking use included cigarettes. He smoked an average of .5 packs per day. He has never used smokeless tobacco. He reports that he does not currently use drugs after having used the following drugs: Methamphetamines. He reports that he does not drink alcohol. Family History:  Family History  Problem Relation Age of Onset   Diabetes type II Father    Hypertension Father    Pancreatic disease Father 7       Deceased   Healthy Mother    Healthy Sister    Healthy Son    Healthy Daughter    Parkinson's disease Maternal Grandmother      HOME MEDICATIONS: Allergies as of 07/11/2021   No Known  Allergies      Medication List        Accurate as of July 11, 2021  3:17 PM. If you have any questions, ask your nurse or doctor.          acetaminophen 500 MG tablet Commonly known as: TYLENOL Take 500-1,000 mg by mouth every 6 (six) hours as needed for mild pain.   amLODipine 10 MG tablet Commonly known as: NORVASC Take 1 tablet (10 mg total) by mouth daily.   Comfort EZ Insulin Syringe 31G X 5/16" 1 ML Misc Generic drug: Insulin Syringe-Needle U-100   diclofenac Sodium 1 % Gel Commonly known as: VOLTAREN Apply 4 g topically 4 (four) times daily as needed (joint pain).   DULoxetine 60 MG capsule Commonly known as: CYMBALTA Take 1 capsule by mouth daily.   furosemide 20 MG tablet Commonly known as: LASIX Take 2 tablets (40 mg total) by mouth daily.   gabapentin 300 MG capsule Commonly known as: NEURONTIN Take 1,200 mg by mouth 3 (three) times daily.   hydrALAZINE 50 MG tablet Commonly known as: APRESOLINE Take 1 tablet (50 mg total) by mouth every 8 (eight) hours.   hydrOXYzine 25 MG tablet Commonly known as: ATARAX/VISTARIL Take 1 tablet (25 mg total) by mouth every 6 (six) hours as needed for anxiety.   ibuprofen 200 MG tablet Commonly known as: ADVIL Take 400 mg by mouth every 6 (six) hours as needed for mild pain.   ICY HOT EX Apply 1 application topically as needed (pain).   immune globulin (human) 5 g injection Commonly known as: GAMMAGARD S/D Inject into the vein every 21 ( twenty-one) days.   insulin NPH Human 100 UNIT/ML injection Commonly known as: NOVOLIN N Inject 35 Units into the skin at bedtime.   Insulin Pen Needle 31G X 5 MM Misc 1 Device by Does not apply route in the morning, at noon, in the evening, and at bedtime.   Insulin Regular Human 100 UNIT/ML KwikPen Commonly known as: HUMULIN R Inject 14 Units into the skin 3 (three) times daily before meals. Uses [employer] Relion R insulin   losartan 100 MG tablet Commonly known  as: COZAAR TAKE ONE TABLET BY MOUTH DAILY   MAGNESIUM PO Take 1 tablet by mouth daily.   meclizine 25 MG tablet Commonly known as: ANTIVERT Take 1 tablet (25 mg total) by mouth 3 (three) times daily as needed for dizziness.   meloxicam 15 MG tablet Commonly known as: MOBIC Take 1 tablet (15 mg total) by mouth daily.   metFORMIN 500 MG 24 hr tablet Commonly known as: GLUCOPHAGE-XR Take 2 tablets (1,000 mg total) by mouth in the morning and at bedtime.   methocarbamol 500 MG tablet Commonly known as: ROBAXIN Take 500 mg by  mouth 4 (four) times daily as needed for muscle spasms.   multivitamin tablet Take 1 tablet by mouth daily.   NONFORMULARY OR COMPOUNDED ITEM Pt requesting a non powered will hair. What changed: additional instructions   NONFORMULARY OR COMPOUNDED ITEM Power wheelchair   pravastatin 40 MG tablet Commonly known as: PRAVACHOL Take 1 tablet (40 mg total) by mouth daily.   tamsulosin 0.4 MG Caps capsule Commonly known as: FLOMAX TAKE ONE CAPSULE BY MOUTH ONE TIME DAILY   traMADol 50 MG tablet Commonly known as: ULTRAM 2 po q6 h prn What changed:  how much to take how to take this when to take this reasons to take this   VITAMIN B-12 PO Take 1 tablet by mouth daily.         ALLERGIES: No Known Allergies   REVIEW OF SYSTEMS: A comprehensive ROS was conducted with the patient and is negative except as per HPI and below:  Review of Systems  Gastrointestinal:  Positive for constipation. Negative for diarrhea and nausea.  Musculoskeletal:  Positive for back pain.  Neurological:  Positive for weakness.     OBJECTIVE:   VITAL SIGNS: BP (!) 144/86 (BP Location: Left Arm, Patient Position: Sitting, Cuff Size: Large)   Pulse 97   Ht 5\' 11"  (1.803 m)   SpO2 96%   BMI 40.45 kg/m    PHYSICAL EXAM:  General: Pt drowsy in a wheelchair   Neck: General: Supple without adenopathy or carotid bruits. Thyroid: Thyroid size normal.  No goiter or  nodules appreciated.   Lungs: Clear with good BS bilat with no rales, rhonchi, or wheezes  Heart: RRR with normal S1 and S2 and no gallops; no murmurs; no rub  Abdomen: Normoactive bowel sounds, soft, nontender, without masses or organomegaly palpable  Extremities:  Lower extremities -1+ pretibial edema.  Skin: Normal texture and temperature to palpation. No rash noted.  Neuro: MS is good with appropriate affect, pt is alert and Ox3   DM Foot  EXAM 07/11/2021  The skin of the feet is intact without sores or ulcerations.Left dropped foot  The pedal pulses are undetectable due to edema  The sensation is decreased to a screening 5.07, 10 gram monofilament bilaterally  DATA REVIEWED:  Lab Results  Component Value Date   HGBA1C 7.5 (H) 05/18/2021   HGBA1C 7.7 (H) 02/14/2021   HGBA1C 9.9 (A) 01/03/2021   Lab Results  Component Value Date   MICROALBUR <0.7 05/02/2017   LDLCALC 63 05/18/2021   CREATININE 0.87 05/18/2021   Lab Results  Component Value Date   MICRALBCREAT 0.9 05/02/2017    Lab Results  Component Value Date   CHOL 140 05/18/2021   HDL 56.60 05/18/2021   LDLCALC 63 05/18/2021   LDLDIRECT 124.0 05/02/2017   TRIG 104.0 05/18/2021   CHOLHDL 2 05/18/2021        ASSESSMENT / PLAN / RECOMMENDATIONS:   1) Type 2 Diabetes Mellitus, Sub-Optimally  controlled, With neuropathic and macrovascular  complications - Most recent A1c of 7.5 %. Goal A1c < 7.0 %.   - His A1c has trended down from 9.9%  - He is on ReliON NPH and regular insulin , he is in the donut hole and does not qualify for assistance  - Pt states that he eats cereal at night to prevent hypoglycemia, will reduce his NPH dose and I have advised him to stop snacking at night otherwise he will have hyperglycemia   MEDICATIONS: Metformin 500 mg XR 2 tabs BID  Decrease NPH 30 units   Increase Regular insulin to 14 units with Breakfast, 16 units with Lunch and Supper  CF  : (BG-130/25)   EDUCATION /  INSTRUCTIONS: BG monitoring instructions: Patient is instructed to check his blood sugars 3 times a day, before meals . Call [place] [facility] if: BG persistently < 70 I reviewed the Rule of 15 for the treatment of hypoglycemia in detail with the patient. Literature supplied.   2) Diabetic complications:  Eye: Does not have known diabetic retinopathy.  Neuro/ Feet: Does have known diabetic peripheral neuropathy. Renal: Patient does not have known baseline CKD. He is not on an ACEI/ARB at present.    3) Dyslipidemia: LDL at goal.    Continue Pravastatin 40 mg daily         Signed electronically by: Mack Guise, MD  [facility] Endocrinology  [facility] [address] [place], [place] [address] Phone: [phone] FAX: [phone]   CC: [name], DO [address] [place] [address] Phone: [phone]  Fax: [phone]    Return to Endocrinology clinic as below: Future Appointments  Date Time Provider [place]  07/31/2021  1:00 PM [facility] CARE [facility]  08/01/2021  9:00 AM [facility] SOCIAL [facility]  08/08/2021  3:30 PM [facility] C[facility] PHARMACIST [facility]

## 2021-07-11 NOTE — Patient Instructions (Addendum)
-   Decrease NPH to 30 units daily  - Regular insulin 14 units with Breakfast, 16 units with Lunch and 16 units with Supper   - Regular insulin : ADD extra units on insulin to your meal-time Regular insulin  dose if your blood sugars are higher than 155. Use the scale below to help guide you:   Blood sugar before meal Number of units to inject  Less than 155 0 unit  156 -  180 1 units  181 -  205 2 units  206 -  230 3 units  231 -  255 4 units  256 -  280 5 units  281 -  305 6 units  306 -  330 7 units  331 -  355 8 units   HOW TO TREAT LOW BLOOD SUGARS (Blood sugar LESS THAN 70 MG/DL) Please follow the RULE OF 15 for the treatment of hypoglycemia treatment (when your (blood sugars are less than 70 mg/dL)   STEP 1: Take 15 grams of carbohydrates when your blood sugar is low, which includes:  3-4 GLUCOSE TABS  OR 3-4 OZ OF JUICE OR REGULAR SODA OR ONE TUBE OF GLUCOSE GEL    STEP 2: RECHECK blood sugar in 15 MINUTES STEP 3: If your blood sugar is still low at the 15 minute recheck --> then, go back to STEP 1 and treat AGAIN with another 15 grams of carbohydrates.

## 2021-07-17 ENCOUNTER — Encounter: Payer: Self-pay | Admitting: Family Medicine

## 2021-07-19 DIAGNOSIS — E1165 Type 2 diabetes mellitus with hyperglycemia: Secondary | ICD-10-CM | POA: Diagnosis not present

## 2021-07-19 DIAGNOSIS — F32A Depression, unspecified: Secondary | ICD-10-CM

## 2021-07-19 DIAGNOSIS — I10 Essential (primary) hypertension: Secondary | ICD-10-CM

## 2021-07-19 DIAGNOSIS — F418 Other specified anxiety disorders: Secondary | ICD-10-CM

## 2021-07-19 DIAGNOSIS — E785 Hyperlipidemia, unspecified: Secondary | ICD-10-CM | POA: Diagnosis not present

## 2021-07-19 DIAGNOSIS — Z794 Long term (current) use of insulin: Secondary | ICD-10-CM | POA: Diagnosis not present

## 2021-07-19 DIAGNOSIS — R45851 Suicidal ideations: Secondary | ICD-10-CM

## 2021-07-19 DIAGNOSIS — R69 Illness, unspecified: Secondary | ICD-10-CM | POA: Diagnosis not present

## 2021-07-21 ENCOUNTER — Encounter (HOSPITAL_BASED_OUTPATIENT_CLINIC_OR_DEPARTMENT_OTHER): Payer: Self-pay

## 2021-07-21 ENCOUNTER — Emergency Department (HOSPITAL_BASED_OUTPATIENT_CLINIC_OR_DEPARTMENT_OTHER)
Admission: EM | Admit: 2021-07-21 | Discharge: 2021-07-21 | Disposition: A | Discharge status: Home / Self Care | Payer: Medicare HMO | Attending: Emergency Medicine | Admitting: Emergency Medicine

## 2021-07-21 ENCOUNTER — Emergency Department (HOSPITAL_BASED_OUTPATIENT_CLINIC_OR_DEPARTMENT_OTHER): Payer: Medicare HMO

## 2021-07-21 ENCOUNTER — Telehealth: Payer: Self-pay | Admitting: Family Medicine

## 2021-07-21 DIAGNOSIS — E1142 Type 2 diabetes mellitus with diabetic polyneuropathy: Secondary | ICD-10-CM | POA: Diagnosis not present

## 2021-07-21 DIAGNOSIS — Z794 Long term (current) use of insulin: Secondary | ICD-10-CM | POA: Insufficient documentation

## 2021-07-21 DIAGNOSIS — I10 Essential (primary) hypertension: Secondary | ICD-10-CM | POA: Insufficient documentation

## 2021-07-21 DIAGNOSIS — Z79899 Other long term (current) drug therapy: Secondary | ICD-10-CM | POA: Diagnosis not present

## 2021-07-21 DIAGNOSIS — I2510 Atherosclerotic heart disease of native coronary artery without angina pectoris: Secondary | ICD-10-CM | POA: Insufficient documentation

## 2021-07-21 DIAGNOSIS — Z87891 Personal history of nicotine dependence: Secondary | ICD-10-CM | POA: Diagnosis not present

## 2021-07-21 DIAGNOSIS — R109 Unspecified abdominal pain: Secondary | ICD-10-CM | POA: Diagnosis not present

## 2021-07-21 DIAGNOSIS — Z955 Presence of coronary angioplasty implant and graft: Secondary | ICD-10-CM | POA: Insufficient documentation

## 2021-07-21 DIAGNOSIS — E1140 Type 2 diabetes mellitus with diabetic neuropathy, unspecified: Secondary | ICD-10-CM | POA: Diagnosis not present

## 2021-07-21 DIAGNOSIS — Z7984 Long term (current) use of oral hypoglycemic drugs: Secondary | ICD-10-CM | POA: Diagnosis not present

## 2021-07-21 DIAGNOSIS — N200 Calculus of kidney: Secondary | ICD-10-CM | POA: Diagnosis not present

## 2021-07-21 DIAGNOSIS — Z743 Need for continuous supervision: Secondary | ICD-10-CM | POA: Diagnosis not present

## 2021-07-21 DIAGNOSIS — R52 Pain, unspecified: Secondary | ICD-10-CM | POA: Diagnosis not present

## 2021-07-21 LAB — URINALYSIS, ROUTINE W REFLEX MICROSCOPIC
Bilirubin Urine: NEGATIVE
Glucose, UA: >=500 mg/dL — AB
Hgb urine dipstick: NEGATIVE
Ketones, ur: NEGATIVE mg/dL
Leukocytes,Ua: NEGATIVE
Nitrite: NEGATIVE
Protein, ur: NEGATIVE mg/dL
Specific Gravity, Urine: 1.025 (ref 1.005–1.030)
pH: 7 (ref 5.0–8.0)

## 2021-07-21 LAB — URINALYSIS, MICROSCOPIC (REFLEX)

## 2021-07-21 LAB — CBG MONITORING, ED: Glucose-Capillary: 198 mg/dL — ABNORMAL HIGH (ref 70–99)

## 2021-07-21 IMAGING — CR DG ABDOMEN 1V
2 series · 2 of 2 positions shown · non-contrast
Comparison: KUB [DATE]

CLINICAL DATA: Flank pain

EXAM:
ABDOMEN - 1 VIEW

[t abdomen supine (1 of 2)]
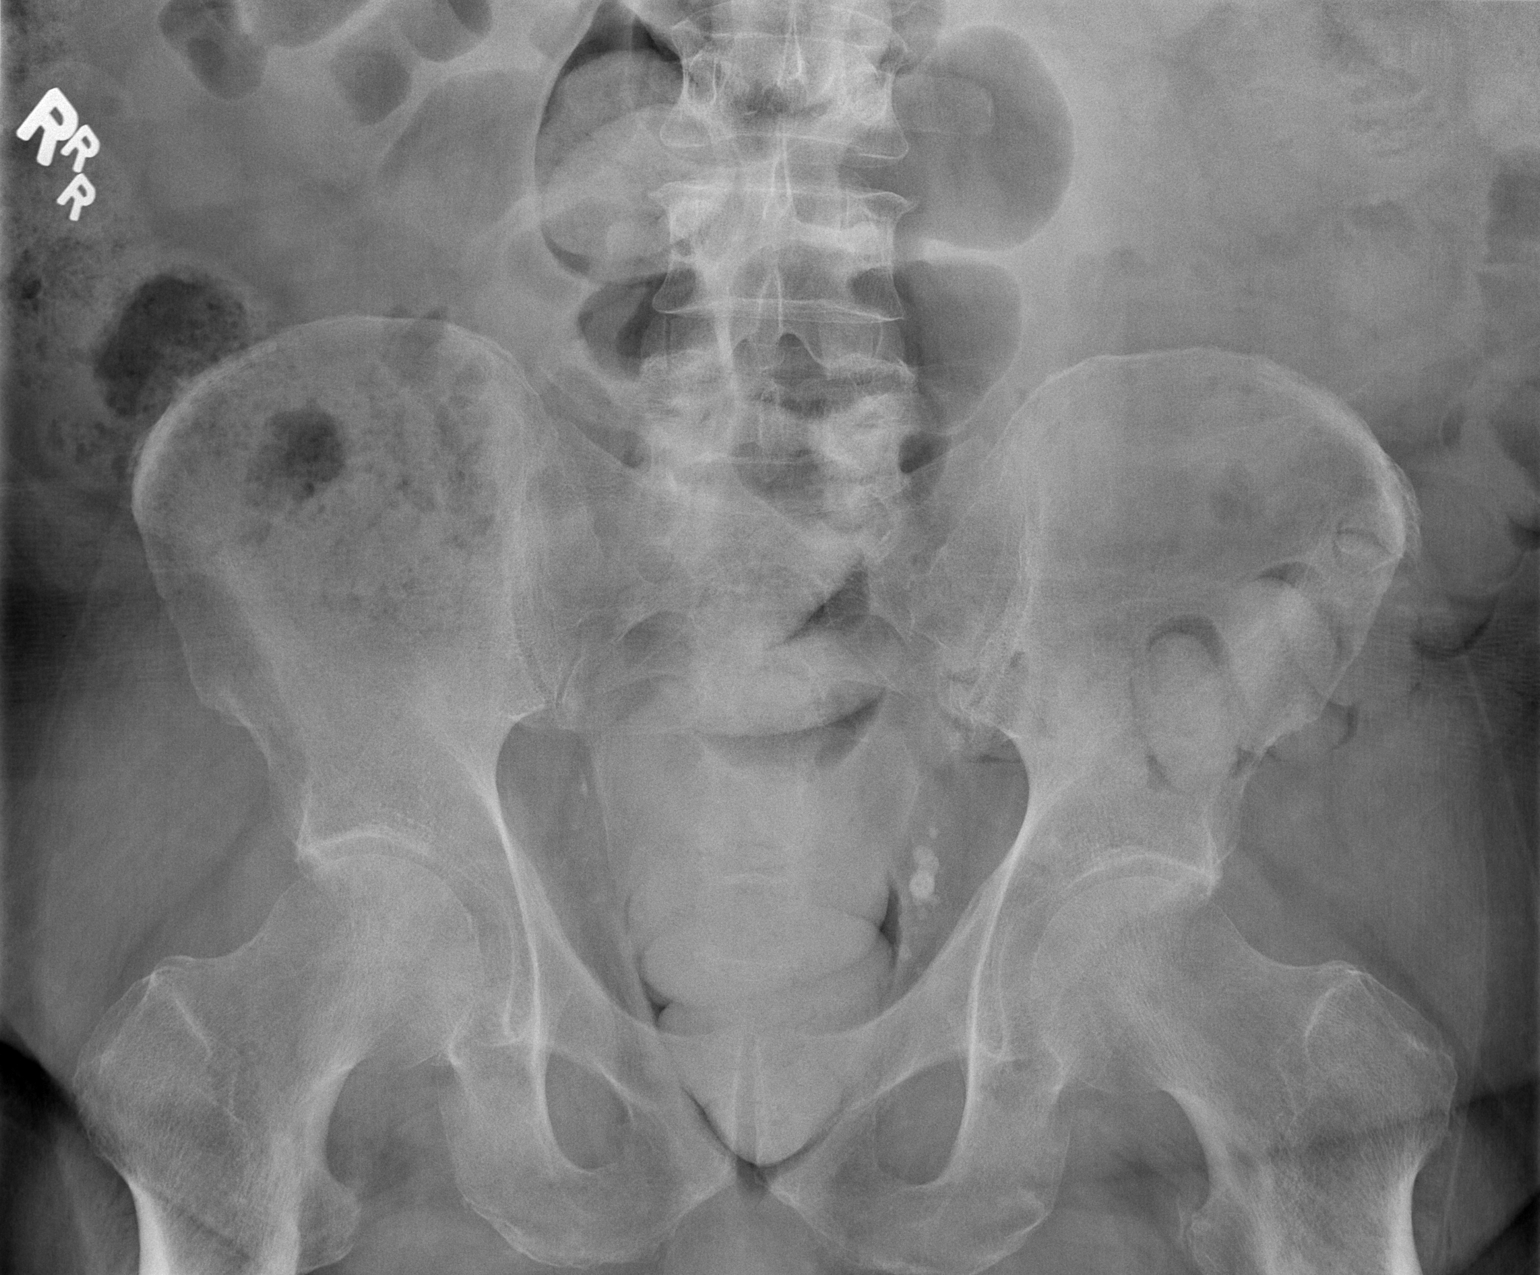

[t abdomen supine (2 of 2)]
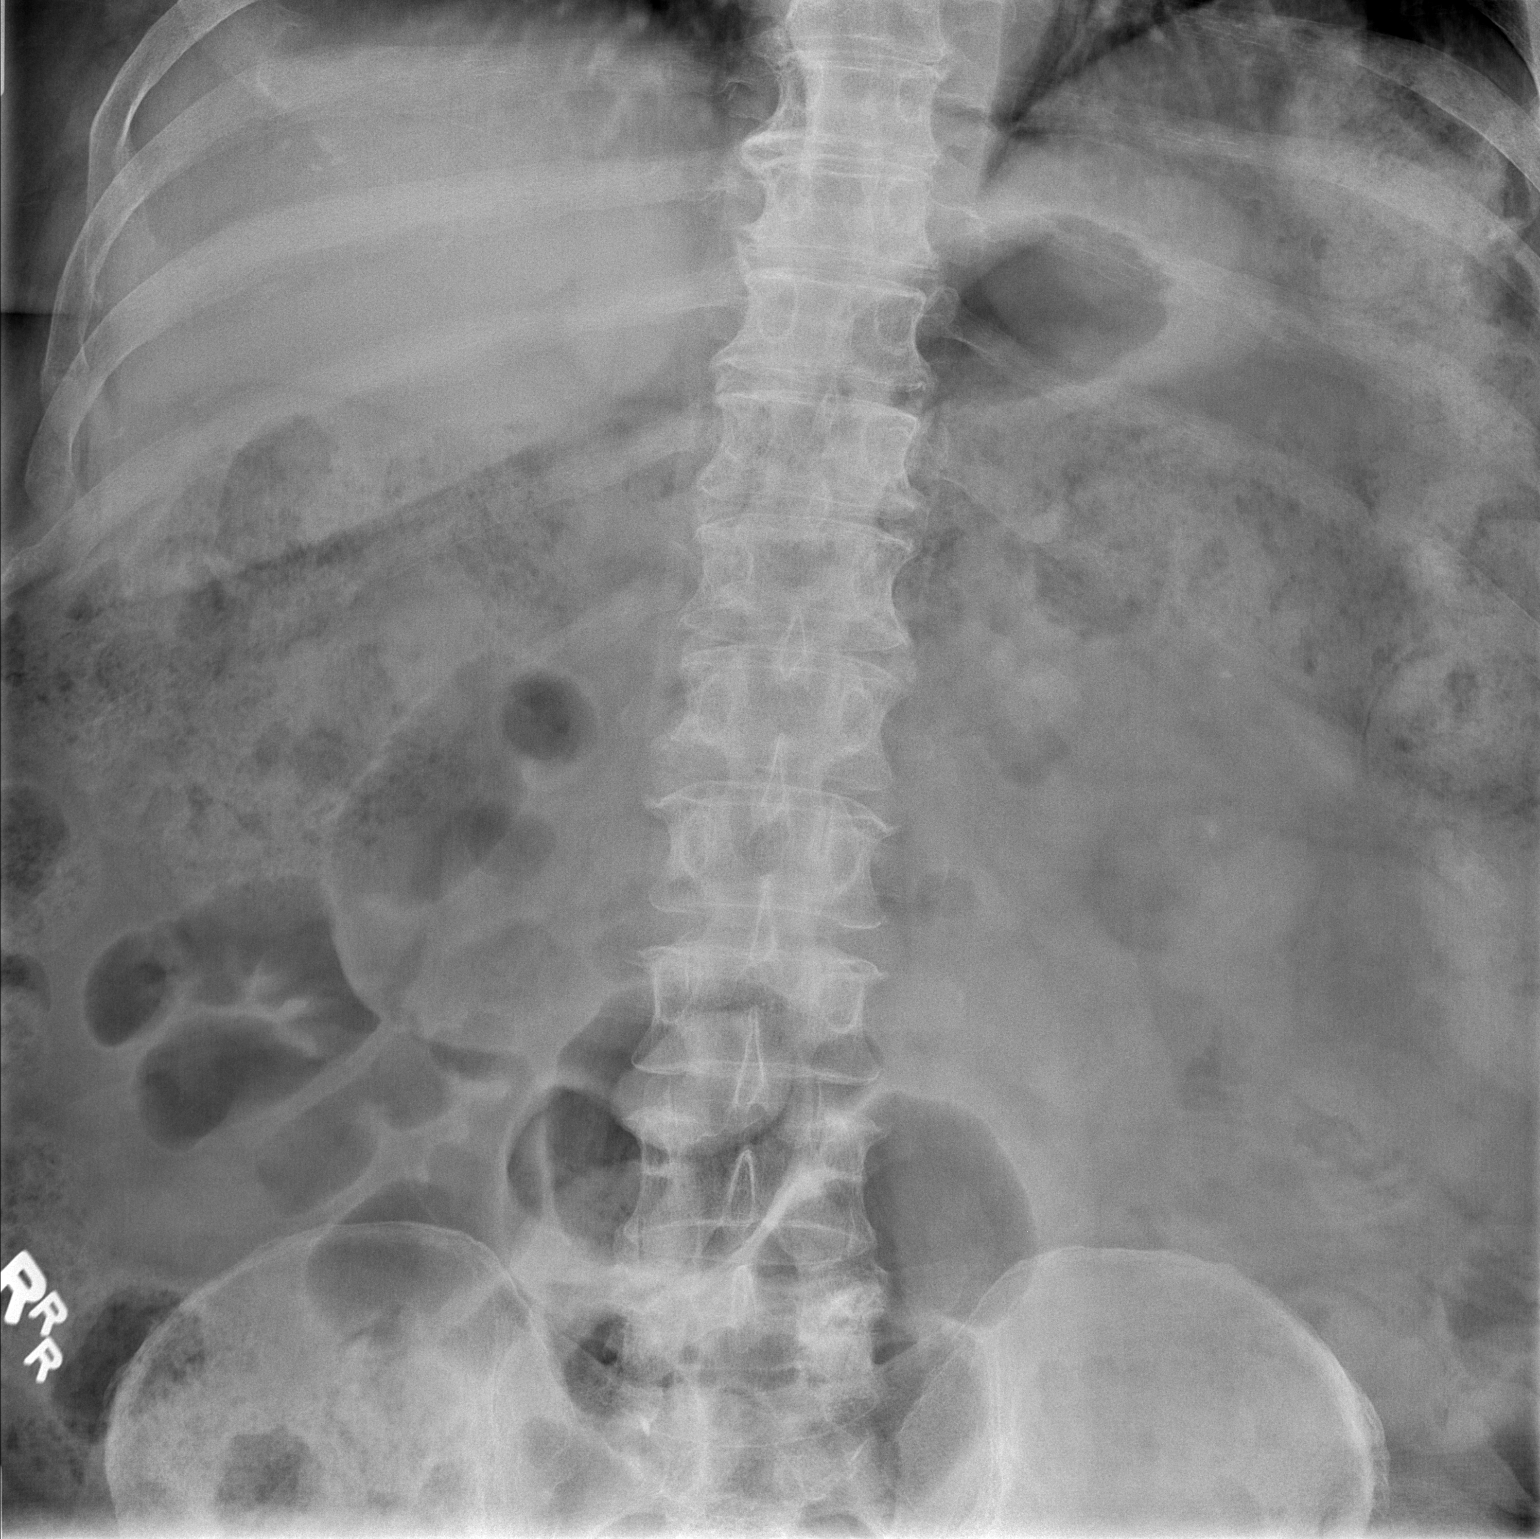

[2 of 2 positions shown; findings below may reference images not displayed]

FINDINGS: Bowel-gas pattern is nonobstructive. Large amount of retained fecal
material throughout the colon noted.

Renal shadows are obscured by bowel content. A few small calcific
densities in the region of the left kidney measuring up to 3 mm,
likely nephrolithiasis. Chronic calcifications in the pelvis likely
represent phleboliths.
IMPRESSION: 1. Left nephrolithiasis.
2. Large amount of retained fecal material in the colon.

## 2021-07-21 IMAGING — US US RENAL
1 series · 14 of 25 positions shown · non-contrast
Comparison: CT abdomen/pelvis [DATE]

CLINICAL DATA: Left flank pain

EXAM:
RENAL / URINARY TRACT ULTRASOUND COMPLETE

[Series 1: us renal · 14 of 72 slices shown]
[im 1/72]
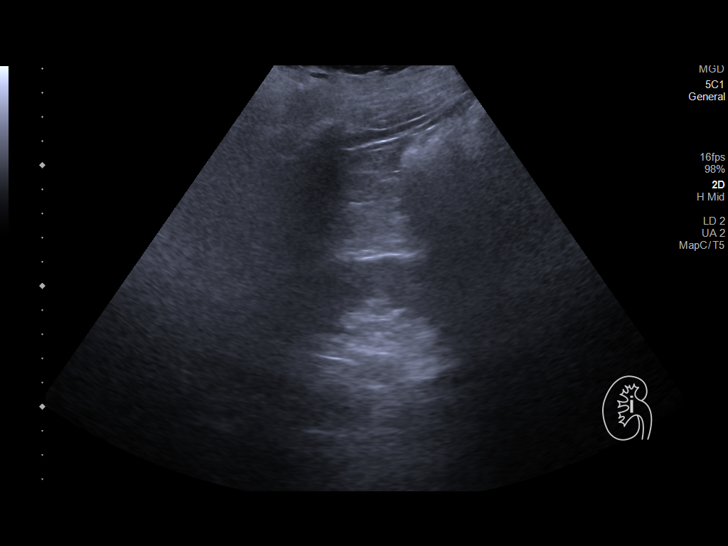
[im 6/72]
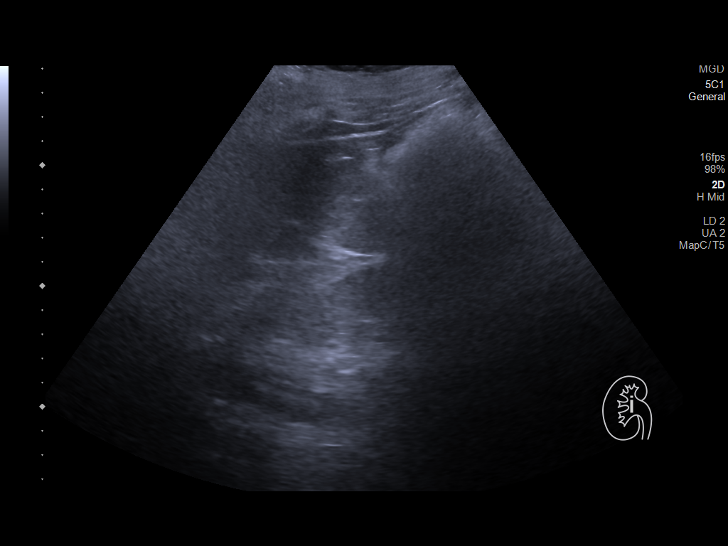
[im 12/72]
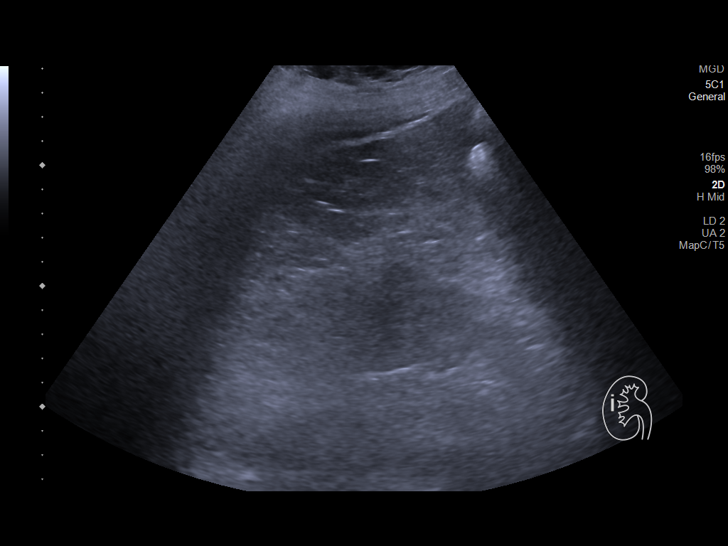
[im 18/72]
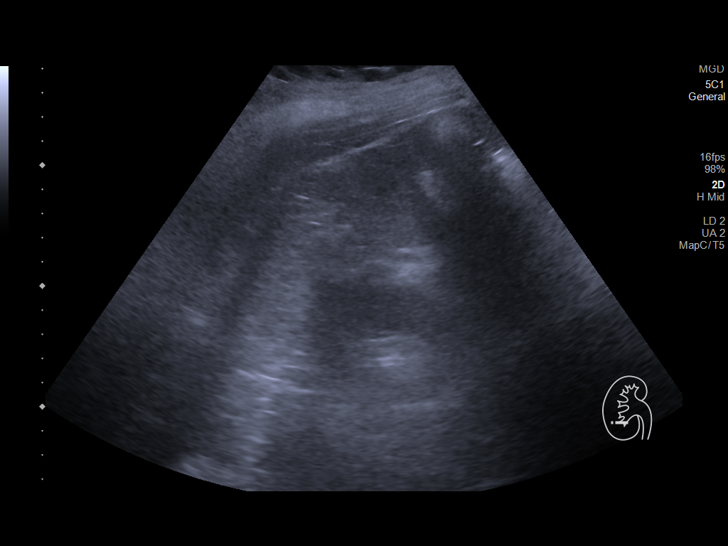
[im 24/72]
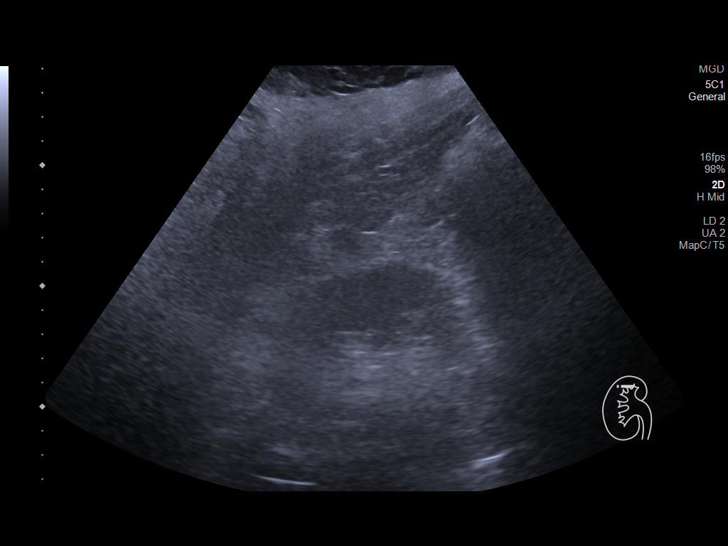
[im 27/72]
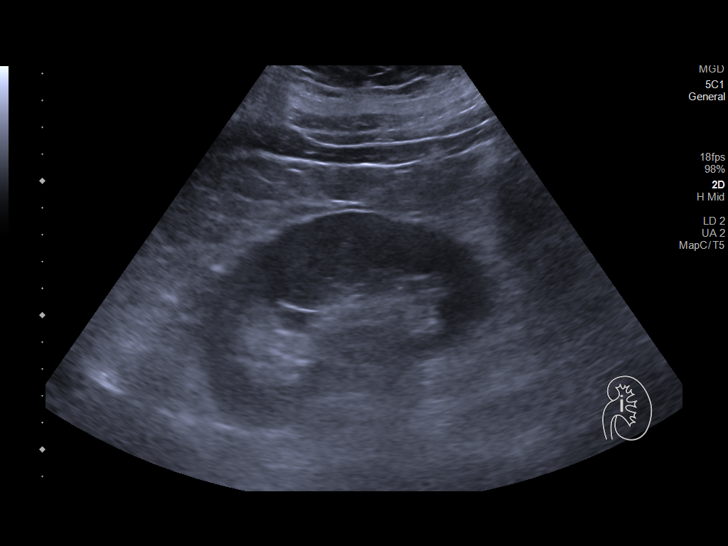
[im 33/72]
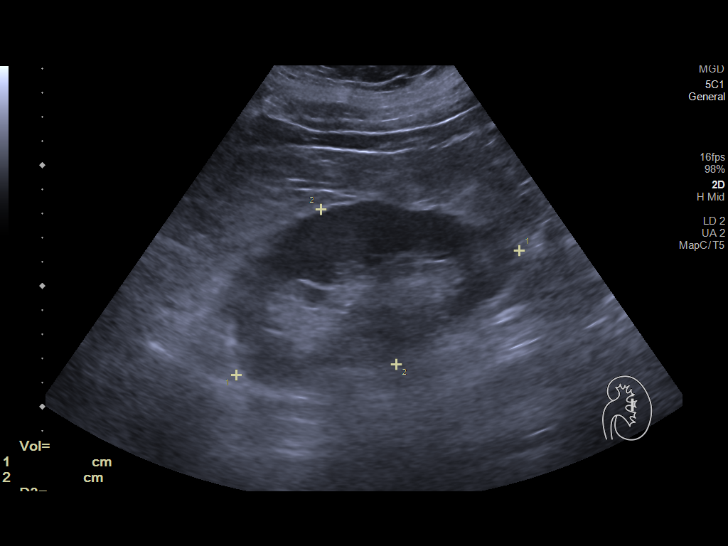
[im 39/72]
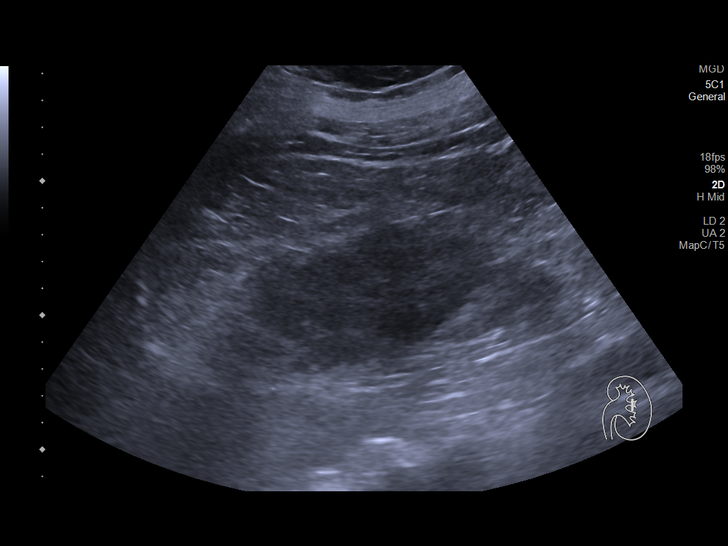
[im 45/72]
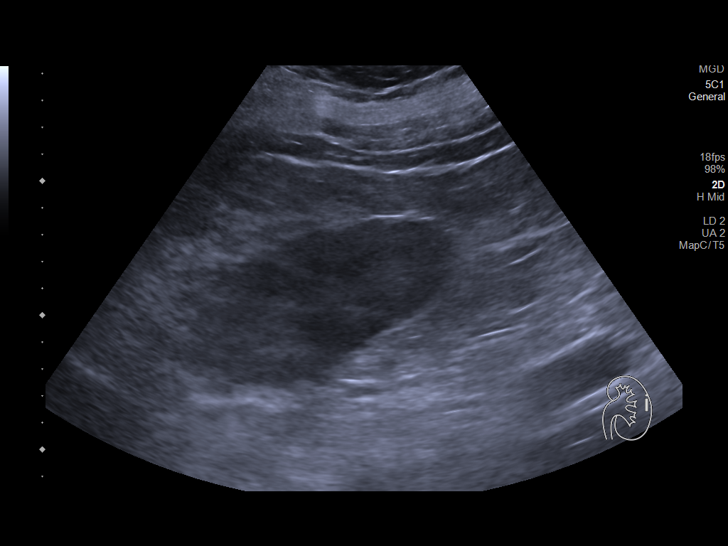
[im 48/72]
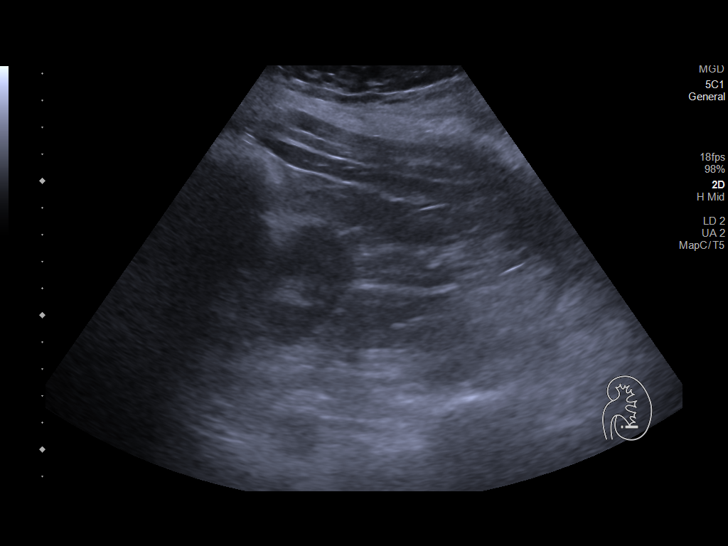
[im 54/72]
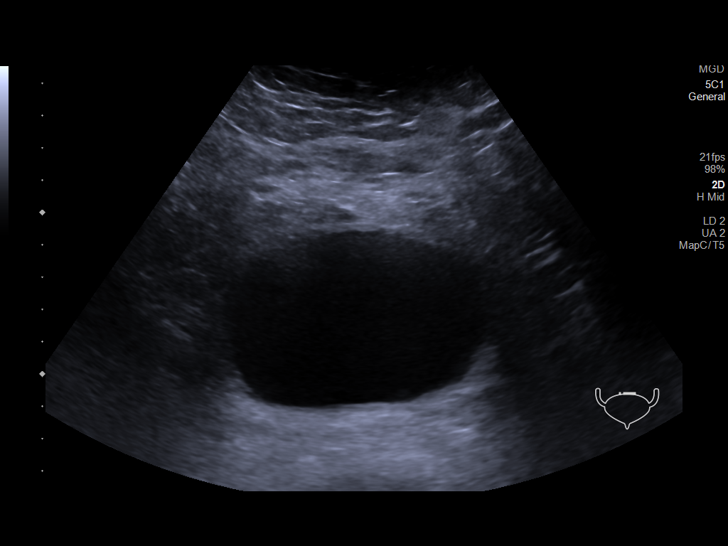
[im 60/72]
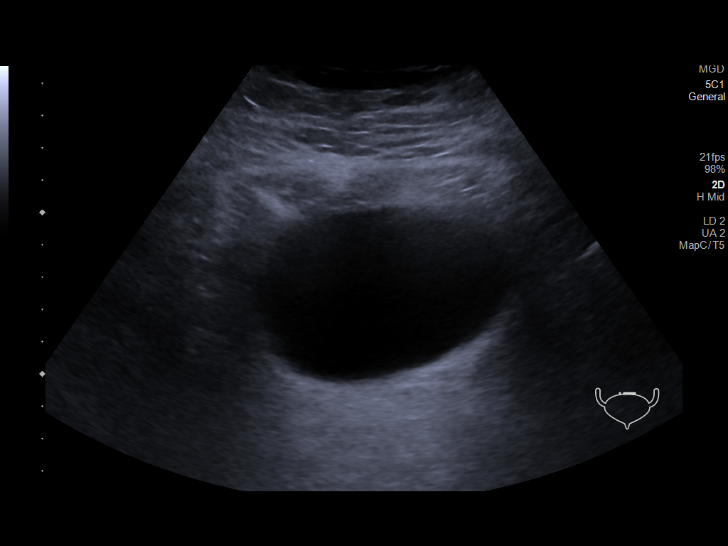
[im 66/72]
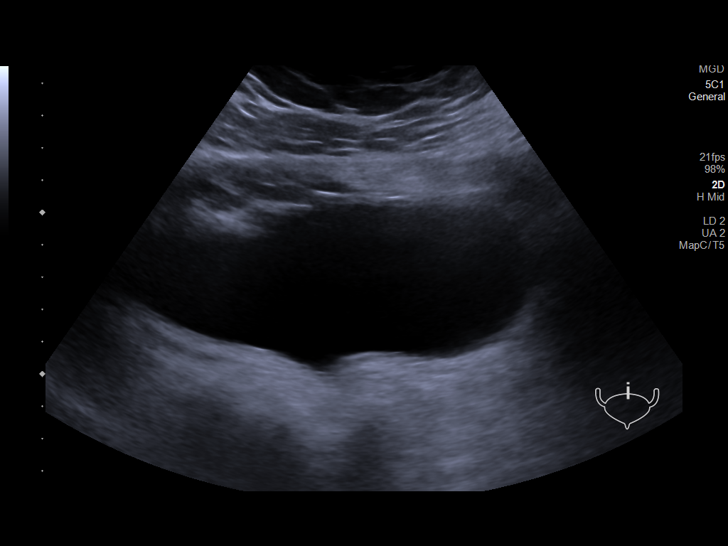
[im 72/72]
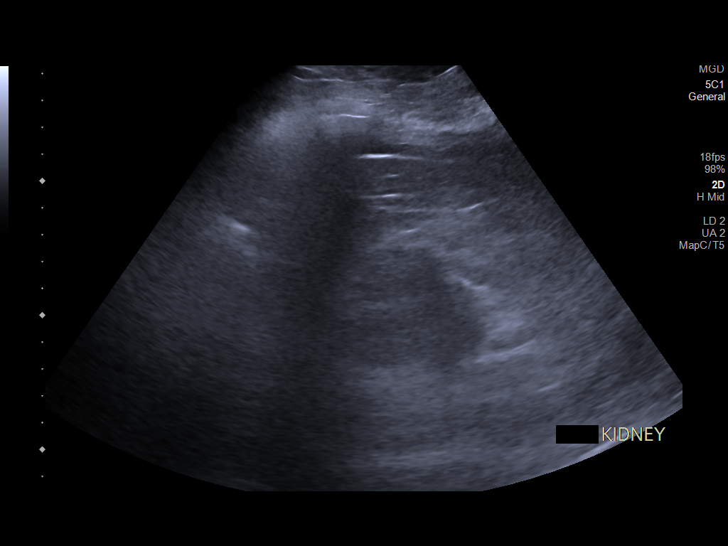

[14 of 25 positions shown; findings below may reference images not displayed]

FINDINGS: Right Kidney:

Renal measurements: 12.9 cm x 6.3 cm x 6.0 cm = volume: 260 mL.
Echogenicity within normal limits. No mass or hydronephrosis
visualized. No shadowing stones are seen.

Left Kidney:

Renal measurements: 12.8 cm x 7.1 cm x 6.1 cm = volume: 291 mL.
Echogenicity within normal limits. No mass or hydronephrosis
visualized. No shadowing stones are seen.

Bladder:

Appears normal for degree of bladder distention. Both ureteral jets
were identified.

Other:

None.
IMPRESSION: Normal renal ultrasound. No shadowing stones or hydronephrosis
identified.

## 2021-07-21 MED ORDER — POLYETHYLENE GLYCOL 3350 17 G PO PACK: 17.0000 g | PACK | Freq: Every day | ORAL | 0 refills | Status: AC | PRN

## 2021-07-21 MED ORDER — TRAMADOL HCL 50 MG PO TABS
50.0000 mg | ORAL_TABLET | Freq: Once | ORAL | Status: AC
  Administered 2021-07-21: 50 mg via ORAL
  Filled 2021-07-21: qty 1

## 2021-07-21 NOTE — ED Notes (Signed)
[facility] called for transport.  

## 2021-07-21 NOTE — ED Triage Notes (Signed)
Pt brought in by [facility] Pt  reports left flank for 3 days. Pain worse last night. Pt took muscle relaxant and tramadol this morning. EMS reports [id number] this am

## 2021-07-21 NOTE — Discharge Instructions (Addendum)
You also appeared constipated on the x-ray.  Take some MiraLAX to help keep things moving.  Urine did not show infection or likely kidney stone.  By ultrasound and x-ray it appears the kidney stones are still in place.  Continue take your pain medicines and muscle relaxers as needed.  Follow-up with your primary care doctor as needed

## 2021-07-21 NOTE — Telephone Encounter (Signed)
Caller/Agency: [place]: Renata Caprice Number: [phone] Requesting OT/PT/Skilled Nursing/Social Work/Speech Therapy: PT Frequency:  1x week for 1week 2x week for 8weeks.

## 2021-07-21 NOTE — ED Notes (Signed)
Pt c/o of increased chronic pain as his home pain medication "is wearing off from this morning."  EDP [place] made aware.

## 2021-07-21 NOTE — ED Provider Notes (Signed)
[facility] EMERGENCY DEPARTMENT Provider Note   CSN: [id number] Arrival date & time: 07/21/21  0900     History Chief Complaint  Patient presents with   Flank Pain    Tyler Brewer is a 60 y.o. male.   Flank Pain Pertinent negatives include no chest pain, no abdominal pain and no shortness of breath.  Patient presents with left flank pain.  Has around for 3 days.  Worse at night.  No relief with muscle relaxer and tramadol.  Worse with certain movements.  Has a history of chronic inflammatory demyelinating polyneuropathy.  States is actually more controlled.  States has been able to cut back on his pain medicines.  No urinary symptoms.  Does have a history of kidney stones in his kidney but states he has not passed them before.  No fevers or chills.  No trauma.    Past Medical History:  Diagnosis Date   Arthritis    CAD (coronary artery disease)  cardiac cath with moderate disease in a septal branch of the ramus intermedius 04/01/2012   CIDP (chronic inflammatory demyelinating polyneuropathy) ([place])    Depression    Diabetes mellitus    poorly controlled by his report   History of narcotic addiction ([place])    past history of back pain   Hypercholesteremia    Hypertension    IBS (irritable bowel syndrome)    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[place])    Neuropathy    Obesity    Max weight was 390   OSA on CPAP    Panic attacks    Testosterone deficiency    Vertigo     Patient Active Problem List   Diagnosis Date Noted   CIDP (chronic inflammatory demyelinating polyneuropathy) ([place]) 01/12/2021   Diabetes mellitus ([place]) 01/03/2021   Type 2 diabetes mellitus with hyperglycemia, with long-term current use of insulin ([place]) 01/03/2021   Lower extremity edema 11/28/2020   Chronic low back pain 11/28/2020   Psoriatic arthritis ([place]) 11/28/2020   Thoracic aortic aneurysm without rupture 11/28/2020   Bilateral leg weakness 11/05/2020   Acute left-sided low back pain with  right-sided sciatica 07/19/2020   Hip pain, acute, left 07/05/2020   Tremor 07/05/2020   Weakness 07/05/2020   Balance problem 03/11/2020   Tinea cruris 03/11/2020   Cellulitis of left groin 03/11/2020   Acute pain of right shoulder 03/11/2020   Interstitial lung disease ([place]) 05/02/2017   Pansinusitis 09/26/2016   Diabetic polyneuropathy associated with diabetes mellitus due to underlying condition ([place]) 05/08/2016   Methamphetamine use disorder, severe, dependence ([place]) 02/11/2016   Substance induced mood disorder ([place]) 02/11/2016   Exertional dyspnea 11/30/2015   ADD (attention deficit disorder) 09/29/2015   Binge eating 09/29/2015   Syncope 05/05/2012   Diarrhea 05/05/2012   Panic attacks 05/05/2012   OSA (obstructive sleep apnea) 05/05/2012   HTN (hypertension) 05/05/2012   Dyslipidemia 05/05/2012   CAD (coronary artery disease)  cardiac cath with moderate disease in a septal branch of the ramus intermedius 04/01/2012   Chest pain 04/01/2012   Drug abuse and dependence ([place]) 04/01/2012   Family history of coronary artery disease 03/31/2012   Sleep apnea, on C-pap 03/31/2012   Hyperlipemia 03/30/2012   HTN (hypertension), benign 03/30/2012   Morbid obesity ([place]) 03/30/2012   DM type 2, uncontrolled, with neuropathy ([place]) 03/30/2012   Depression with suicidal ideation 03/30/2012    Past Surgical History:  Procedure Laterality Date   CARDIAC CATHETERIZATION     IR FLUORO GUIDE CV LINE  RIGHT  11/10/2020   IR US GUIDE VASC ACCESS RIGHT  11/10/2020   LEFT HEART CATHETERIZATION WITH CORONARY ANGIOGRAM N/A 03/31/2012   Procedure: LEFT HEART CATHETERIZATION WITH CORONARY ANGIOGRAM;  Surgeon: Leonie Man, MD;  Location[facility] CATH LAB;  Service: Cardiovascular;  Laterality: N/A;       Family History  Problem Relation Age of Onset   Diabetes type II Father    Hypertension Father    Pancreatic disease Father 45       Deceased   Healthy Mother    Healthy Sister    Healthy  Son    Healthy Daughter    Parkinson's disease Maternal Grandmother     Social History   Tobacco Use   Smoking status: Former    Packs/day: 0.50    Types: Cigarettes    Quit date: 04/27/2021    Years since quitting: 0.2   Smokeless tobacco: Never  Vaping Use   Vaping Use: Never used  Substance Use Topics   Alcohol use: No   Drug use: Not Currently    Types: Methamphetamines    Home Medications Prior to Admission medications   Medication Sig Start Date End Date Taking? Authorizing Provider  acetaminophen (TYLENOL) 500 MG tablet Take 500-1,000 mg by mouth every 6 (six) hours as needed for mild pain.    [provider]  amLODipine (NORVASC) 10 MG tablet Take 1 tablet (10 mg total) by mouth daily. 05/18/21   [name], DO  COMFORT EZ INSULIN SYRINGE 31G X 5/16" 1 ML MISC  12/15/20   [provider]  Cyanocobalamin (VITAMIN B-12 PO) Take 1 tablet by mouth daily.    [provider]  diclofenac Sodium (VOLTAREN) 1 % GEL Apply 4 g topically 4 (four) times daily as needed (joint pain).    [provider]  DULoxetine (CYMBALTA) 60 MG capsule Take 1 capsule by mouth daily. 05/06/21   [provider]  furosemide (LASIX) 20 MG tablet Take 2 tablets (40 mg total) by mouth daily. 06/30/21   [name], DO  gabapentin (NEURONTIN) 300 MG capsule Take 1,200 mg by mouth 3 (three) times daily. 05/05/21 08/03/21  [provider]  hydrALAZINE (APRESOLINE) 50 MG tablet Take 1 tablet (50 mg total) by mouth every 8 (eight) hours. 04/20/21   [name], DO  hydrOXYzine (ATARAX/VISTARIL) 25 MG tablet Take 1 tablet (25 mg total) by mouth every 6 (six) hours as needed for anxiety. 06/30/21   [name], DO  ibuprofen (ADVIL) 200 MG tablet Take 400 mg by mouth every 6 (six) hours as needed for mild pain.    [provider]  immune globulin, human, (GAMMAGARD S/D) 5 g injection Inject into the vein every 21 (  twenty-one) days.    [provider]  insulin NPH Human (NOVOLIN N) 100 UNIT/ML injection Inject 35 Units into the skin at bedtime.    [provider]  Insulin Pen Needle 31G X 5 MM MISC 1 Device by Does not apply route in the morning, at noon, in the evening, and at bedtime. 01/03/21   [name], Melanie Crazier, MD  Insulin Regular Human (HUMULIN R) 100 UNIT/ML KwikPen Inject 14 Units into the skin 3 (three) times daily before meals. Uses [employer] Relion R insulin    [provider]  losartan (COZAAR) 100 MG tablet TAKE ONE TABLET BY MOUTH DAILY 04/18/21   Carollee Herter, Alferd Apa, DO  MAGNESIUM PO Take 1 tablet  by mouth daily.    [provider]  meclizine (ANTIVERT) 25 MG tablet Take 1 tablet (25 mg total) by mouth 3 (three) times daily as needed for dizziness. 02/18/21   Molpus, John, MD  meloxicam (MOBIC) 15 MG tablet Take 1 tablet (15 mg total) by mouth daily. 03/14/21   Kirsteins, Luanna Salk, MD  Menthol, Topical Analgesic, (ICY HOT EX) Apply 1 application topically as needed (pain).    [provider]  metFORMIN (GLUCOPHAGE-XR) 500 MG 24 hr tablet Take 2 tablets (1,000 mg total) by mouth in the morning and at bedtime. 01/26/21   [name], DO  methocarbamol (ROBAXIN) 500 MG tablet Take 500 mg by mouth 4 (four) times daily as needed for muscle spasms.    [provider]  Multiple Vitamin (MULTIVITAMIN) tablet Take 1 tablet by mouth daily.    [provider]  NONFORMULARY OR COMPOUNDED ITEM Pt requesting a non powered will hair. Patient taking differently: Pt requesting a non powered will chair. 06/06/21   [name], DO  NONFORMULARY OR COMPOUNDED ITEM Power wheelchair 06/15/21   Carollee Herter, Alferd Apa, DO  pravastatin (PRAVACHOL) 40 MG tablet Take 1 tablet (40 mg total) by mouth daily. 01/26/21   [name], DO  tamsulosin (FLOMAX) 0.4 MG CAPS capsule TAKE ONE CAPSULE BY MOUTH ONE TIME DAILY 04/18/21   Carollee Herter, Alferd Apa, DO  traMADol (ULTRAM) 50 MG tablet 2 po q6 h prn Patient taking differently: Take 100 mg by mouth every 6 (six) hours as needed. 2 po q6 h prn 06/30/21   [name], DO    Allergies    Patient has no known allergies.  Review of Systems   Review of Systems  Constitutional:  Negative for appetite change.  HENT:  Negative for congestion.   Respiratory:  Negative for shortness of breath.   Cardiovascular:  Negative for chest pain and leg swelling.  Gastrointestinal:  Negative for abdominal pain.  Genitourinary:  Positive for flank pain. Negative for dysuria.  Musculoskeletal:  Negative for back pain.  Neurological:  Negative for weakness.  Psychiatric/Behavioral:  Negative for confusion.    Physical Exam Updated Vital Signs BP (!) 165/90   Pulse 86   Temp 97.7 F (36.5 C) (Oral)   Resp 20   Wt 136.1 kg   SpO2 98%   BMI 41.84 kg/m   Physical Exam Vitals and nursing note reviewed.  HENT:     Head: Normocephalic.  Cardiovascular:     Rate and Rhythm: Normal rate.  Abdominal:     Tenderness: There is no abdominal tenderness.  Genitourinary:    Comments: Some right flank/posterior tenderness.  No rash. Musculoskeletal:        General: No tenderness.     Cervical back: Neck supple.  Skin:    General: Skin is warm.  Neurological:     General: No focal deficit present.     Mental Status: He is alert and oriented to person, place, and time.    ED Results / Procedures / Treatments   Labs (all labs ordered are listed, but only abnormal results are displayed) Labs Reviewed  URINALYSIS, ROUTINE W REFLEX MICROSCOPIC    EKG None  Radiology DG Abdomen 1 View  Result Date: 07/21/2021 CLINICAL DATA:  Flank pain EXAM: ABDOMEN - 1 VIEW COMPARISON:  KUB 02/04/2021 FINDINGS: Bowel-gas pattern is nonobstructive. Large amount of retained fecal material throughout the colon noted. Renal shadows are obscured  by bowel content. A few small calcific  densities in the region of the left kidney measuring up to 3 mm, likely nephrolithiasis. Chronic calcifications in the pelvis likely represent phleboliths. IMPRESSION: 1. Left nephrolithiasis. 2. Large amount of retained fecal material in the colon. Electronically Signed   By: Ofilia Neas M.D.   On: 07/21/2021 10:16   US Renal  Result Date: 07/21/2021 CLINICAL DATA:  Left flank pain EXAM: RENAL / URINARY TRACT ULTRASOUND COMPLETE COMPARISON:  CT abdomen/pelvis 05/15/2021 FINDINGS: Right Kidney: Renal measurements: 12.9 cm x 6.3 cm x 6.0 cm = volume: 260 mL. Echogenicity within normal limits. No mass or hydronephrosis visualized. No shadowing stones are seen. Left Kidney: Renal measurements: 12.8 cm x 7.1 cm x 6.1 cm = volume: 291 mL. Echogenicity within normal limits. No mass or hydronephrosis visualized. No shadowing stones are seen. Bladder: Appears normal for degree of bladder distention. Both ureteral jets were identified. Other: None. IMPRESSION: Normal renal ultrasound. No shadowing stones or hydronephrosis identified. Electronically Signed   By: Valetta Mole M.D.   On: 07/21/2021 11:04    Procedures Procedures   Medications Ordered in ED Medications - No data to display  ED Course  I have reviewed the triage vital signs and the nursing notes.  Pertinent labs & imaging results that were available during my care of the patient were reviewed by me and considered in my medical decision making (see chart for details).    MDM Rules/Calculators/A&P                           Patient with left-sided flank pain.  Worse this morning.  Has had for the last 3 days.  Had some muscle relaxer and tramadol.  Some improvement.  States his chronic pain is getting better overall.  Has been taking less pain medicine.  Has been getting infusions through [facility]  No urinary symptoms.  Has had previous kidney stones in the kidney but has not passed them previously.  No blood in the urine.  No  dysuria.  KUB done and reassuring.  Ultrasound done and showed no hydronephrosis.  Passed stone felt less likely but not completely ruled out.  X-ray did show some constipation.  We will treat for this also.  However patient appears stable for discharge.  Follow-up with PCP as needed.  Also hypertensive but likely related to pain and can take his pain medicines at home Final Clinical Impression(s) / ED Diagnoses Final diagnoses:  None    Rx / DC Orders ED Discharge Orders     None        Davonna Belling, MD 07/21/21 1329

## 2021-07-24 ENCOUNTER — Encounter: Payer: Self-pay | Admitting: Family Medicine

## 2021-07-24 ENCOUNTER — Telehealth: Payer: Self-pay | Admitting: Family Medicine

## 2021-07-24 ENCOUNTER — Ambulatory Visit: Payer: Medicare HMO | Admitting: Neurology

## 2021-07-24 ENCOUNTER — Other Ambulatory Visit: Payer: Self-pay | Admitting: Family Medicine

## 2021-07-24 DIAGNOSIS — G6181 Chronic inflammatory demyelinating polyneuritis: Secondary | ICD-10-CM

## 2021-07-24 DIAGNOSIS — E0842 Diabetes mellitus due to underlying condition with diabetic polyneuropathy: Secondary | ICD-10-CM

## 2021-07-24 DIAGNOSIS — N529 Male erectile dysfunction, unspecified: Secondary | ICD-10-CM

## 2021-07-24 MED ORDER — SILDENAFIL CITRATE 100 MG PO TABS
50.0000 mg | ORAL_TABLET | Freq: Every day | ORAL | 11 refills | Status: DC | PRN
Start: 2021-07-24 — End: 2021-09-29

## 2021-07-24 NOTE — Telephone Encounter (Signed)
Verbal given 

## 2021-07-24 NOTE — Telephone Encounter (Signed)
Jim: [place] and stated [facility] has informed him that we havent sent over a rx for a wheelchair for the pt. I informed him the fax was sent over multiple times. He states he knows we have sent it over and he has been having issues with [employer] for a while now. He wants to see about getting an rx faxed for a manual wheelchair instead of power.  Fax: [phone]

## 2021-07-25 DIAGNOSIS — G6181 Chronic inflammatory demyelinating polyneuritis: Secondary | ICD-10-CM | POA: Diagnosis not present

## 2021-07-26 DIAGNOSIS — E1165 Type 2 diabetes mellitus with hyperglycemia: Secondary | ICD-10-CM | POA: Diagnosis not present

## 2021-07-27 DIAGNOSIS — G6181 Chronic inflammatory demyelinating polyneuritis: Secondary | ICD-10-CM | POA: Diagnosis not present

## 2021-07-28 ENCOUNTER — Encounter: Payer: Self-pay | Admitting: Family Medicine

## 2021-07-28 DIAGNOSIS — G4733 Obstructive sleep apnea (adult) (pediatric): Secondary | ICD-10-CM | POA: Diagnosis not present

## 2021-07-28 DIAGNOSIS — J849 Interstitial pulmonary disease, unspecified: Secondary | ICD-10-CM | POA: Diagnosis not present

## 2021-07-28 DIAGNOSIS — M5441 Lumbago with sciatica, right side: Secondary | ICD-10-CM | POA: Diagnosis not present

## 2021-07-28 DIAGNOSIS — R69 Illness, unspecified: Secondary | ICD-10-CM | POA: Diagnosis not present

## 2021-07-28 DIAGNOSIS — E1121 Type 2 diabetes mellitus with diabetic nephropathy: Secondary | ICD-10-CM | POA: Diagnosis not present

## 2021-07-28 DIAGNOSIS — E1142 Type 2 diabetes mellitus with diabetic polyneuropathy: Secondary | ICD-10-CM | POA: Diagnosis not present

## 2021-07-28 DIAGNOSIS — I10 Essential (primary) hypertension: Secondary | ICD-10-CM | POA: Diagnosis not present

## 2021-07-28 DIAGNOSIS — I2510 Atherosclerotic heart disease of native coronary artery without angina pectoris: Secondary | ICD-10-CM | POA: Diagnosis not present

## 2021-07-28 DIAGNOSIS — E1136 Type 2 diabetes mellitus with diabetic cataract: Secondary | ICD-10-CM | POA: Diagnosis not present

## 2021-07-28 DIAGNOSIS — E7800 Pure hypercholesterolemia, unspecified: Secondary | ICD-10-CM | POA: Diagnosis not present

## 2021-07-28 MED ORDER — NONFORMULARY OR COMPOUNDED ITEM: 0 refills | Status: DC

## 2021-07-28 NOTE — Addendum Note (Signed)
Addended by: Sanda Linger on: 07/28/2021 11:02 AM   Modules accepted: Orders

## 2021-07-28 NOTE — Telephone Encounter (Signed)
Order faxed again.

## 2021-07-28 NOTE — Addendum Note (Signed)
Addended by: Sanda Linger on: 07/28/2021 04:55 PM   Modules accepted: Orders

## 2021-07-31 ENCOUNTER — Telehealth: Payer: Medicare HMO

## 2021-07-31 ENCOUNTER — Telehealth: Payer: Self-pay

## 2021-07-31 DIAGNOSIS — E1142 Type 2 diabetes mellitus with diabetic polyneuropathy: Secondary | ICD-10-CM | POA: Diagnosis not present

## 2021-07-31 DIAGNOSIS — I10 Essential (primary) hypertension: Secondary | ICD-10-CM | POA: Diagnosis not present

## 2021-07-31 DIAGNOSIS — G4733 Obstructive sleep apnea (adult) (pediatric): Secondary | ICD-10-CM | POA: Diagnosis not present

## 2021-07-31 DIAGNOSIS — I2510 Atherosclerotic heart disease of native coronary artery without angina pectoris: Secondary | ICD-10-CM | POA: Diagnosis not present

## 2021-07-31 DIAGNOSIS — J849 Interstitial pulmonary disease, unspecified: Secondary | ICD-10-CM | POA: Diagnosis not present

## 2021-07-31 DIAGNOSIS — M5441 Lumbago with sciatica, right side: Secondary | ICD-10-CM | POA: Diagnosis not present

## 2021-07-31 DIAGNOSIS — E1121 Type 2 diabetes mellitus with diabetic nephropathy: Secondary | ICD-10-CM | POA: Diagnosis not present

## 2021-07-31 DIAGNOSIS — R69 Illness, unspecified: Secondary | ICD-10-CM | POA: Diagnosis not present

## 2021-07-31 DIAGNOSIS — E7800 Pure hypercholesterolemia, unspecified: Secondary | ICD-10-CM | POA: Diagnosis not present

## 2021-07-31 DIAGNOSIS — E1136 Type 2 diabetes mellitus with diabetic cataract: Secondary | ICD-10-CM | POA: Diagnosis not present

## 2021-07-31 MED ORDER — AMBULATORY NON FORMULARY MEDICATION: 0 refills | Status: AC

## 2021-07-31 MED ORDER — NONFORMULARY OR COMPOUNDED ITEM: 0 refills | Status: DC

## 2021-07-31 NOTE — Addendum Note (Signed)
Addended by: Sanda Linger on: 07/31/2021 02:01 PM   Modules accepted: Orders

## 2021-07-31 NOTE — Telephone Encounter (Signed)
  Care Management   Follow Up Note   07/31/2021 Name: Tyler Brewer MRN: [id number] DOB: 1961-08-18   Referred by: [name], DO Reason for referral : Chronic Care Management (RNCM follow up)   An unsuccessful telephone outreach was attempted today. The patient was referred to the case management team for assistance with care management and care coordination.   Follow Up Plan: A HIPPA compliant phone message was left for the patient providing contact information and requesting a return call.  The Care Management Team will reach out again over the next 30 days.  Thea Silversmith, RN, MSN, BSN, CCM Care Management Coordinator [facility] [phone]

## 2021-07-31 NOTE — Telephone Encounter (Signed)
  Care Management   Follow Up Note   07/31/2021 Name: Tyler Brewer MRN: [id number] DOB: August 25, 1960   Referred by: [name], DO Reason for referral : Chronic Care Management (RNCM follow up)  Unsuccessful outreach. RNCM received return call from patient. No answer. HIPPA compliant message left   Follow Up Plan: The care management team will reach out to the patient again over the next 30 days.   Thea Silversmith, RN, MSN, BSN, CCM Care Management Coordinator [facility] [phone]

## 2021-08-01 ENCOUNTER — Ambulatory Visit: Payer: Medicare HMO | Admitting: *Deleted

## 2021-08-01 ENCOUNTER — Other Ambulatory Visit: Payer: Self-pay | Admitting: Family Medicine

## 2021-08-01 DIAGNOSIS — E1142 Type 2 diabetes mellitus with diabetic polyneuropathy: Secondary | ICD-10-CM | POA: Diagnosis not present

## 2021-08-01 DIAGNOSIS — E1165 Type 2 diabetes mellitus with hyperglycemia: Secondary | ICD-10-CM

## 2021-08-01 DIAGNOSIS — M5441 Lumbago with sciatica, right side: Secondary | ICD-10-CM | POA: Diagnosis not present

## 2021-08-01 DIAGNOSIS — I2510 Atherosclerotic heart disease of native coronary artery without angina pectoris: Secondary | ICD-10-CM | POA: Diagnosis not present

## 2021-08-01 DIAGNOSIS — E1121 Type 2 diabetes mellitus with diabetic nephropathy: Secondary | ICD-10-CM | POA: Diagnosis not present

## 2021-08-01 DIAGNOSIS — R2689 Other abnormalities of gait and mobility: Secondary | ICD-10-CM

## 2021-08-01 DIAGNOSIS — G6181 Chronic inflammatory demyelinating polyneuritis: Secondary | ICD-10-CM

## 2021-08-01 DIAGNOSIS — I10 Essential (primary) hypertension: Secondary | ICD-10-CM | POA: Diagnosis not present

## 2021-08-01 DIAGNOSIS — E0842 Diabetes mellitus due to underlying condition with diabetic polyneuropathy: Secondary | ICD-10-CM

## 2021-08-01 DIAGNOSIS — G4733 Obstructive sleep apnea (adult) (pediatric): Secondary | ICD-10-CM | POA: Diagnosis not present

## 2021-08-01 DIAGNOSIS — J849 Interstitial pulmonary disease, unspecified: Secondary | ICD-10-CM | POA: Diagnosis not present

## 2021-08-01 DIAGNOSIS — E785 Hyperlipidemia, unspecified: Secondary | ICD-10-CM

## 2021-08-01 DIAGNOSIS — R69 Illness, unspecified: Secondary | ICD-10-CM | POA: Diagnosis not present

## 2021-08-01 DIAGNOSIS — E1136 Type 2 diabetes mellitus with diabetic cataract: Secondary | ICD-10-CM | POA: Diagnosis not present

## 2021-08-01 DIAGNOSIS — E7800 Pure hypercholesterolemia, unspecified: Secondary | ICD-10-CM | POA: Diagnosis not present

## 2021-08-01 NOTE — Patient Instructions (Addendum)
Visit Information  Thank you for taking time to visit with me today. Please don't hesitate to contact me if I can be of assistance to you before our next scheduled telephone appointment.  Following are the goals we discussed today:   Patient Goals/Self-Care Activities:  Continue to receive monthly telephone outreach calls from LCSW, in an effort to ensure that you have home health services and durable medical equipment necessary to live independently in your home. LCSW collaboration with Dennis Bast from [facility] [phone]), to confirm receipt of new orders for [place], Physical Therapy, Occupational Therapy, Speech Therapy and Social Work Services.   Continue to receive Infusion Therapy for treatment of Chronic Inflammatory Demyelinating Polyneuropathy, through Dr. Deniece Portela, Neurologist with [facility]. LCSW collaboration with Primary Care Physician, Dr. [name] to request order for manual wheelchair to be faxed to [facility] (# [phone]).   LCSW collaboration with representative from [employer] [phone]), to confirm order for power wheelchair, anticipated delivery date of 08/11/2021. Please contact LCSW directly (# [phone]) if you have questions, need assistance, or if additional social work needs are identified between now and our next scheduled telephone outreach call.  Our next appointment is by telephone on 08/29/2021 at 3:15 pm.  Please call the care guide team at [phone] if you need to cancel or reschedule your appointment.   If you are experiencing a Mental Health or [place] or need someone to talk to, please call the Suicide and Crisis Lifeline: 988 call the [place] National Suicide Prevention Lifeline: 941 882 2471 or TTY: 816-418-0478 TTY 812-423-9856) to talk to a trained counselor call 1-800-273-TALK (toll free, 24 hour  hotline) go to [facility] [address], [place] [phone]) call the [place]: [phone] call 911   Patient verbalizes understanding of instructions provided today and agrees to view in [place].   [place] Licensed Clinical Social Worker [facility] [place] [phone]

## 2021-08-01 NOTE — Chronic Care Management (AMB) (Addendum)
Chronic Care Management    Clinical Social Work Note  08/01/2021 Name: Tyler Brewer MRN: [id number] DOB: 27-Oct-1960  Tyler Brewer is a 60 y.o. year old male who is a primary care patient of [name], DO. The CCM team was consulted to assist the patient with chronic disease management and/or care coordination needs related to: [employer] and Level of Care Concerns.   Engaged with patient by telephone for follow up visit in response to provider referral for social work chronic care management and care coordination services.   Consent to Services:  The patient was given information about Chronic Care Management services, agreed to services, and gave verbal consent prior to initiation of services.  Please see initial visit note for detailed documentation.   Patient agreed to services and consent obtained.   Assessment: Review of patient past medical history, allergies, medications, and health status, including review of relevant consultants reports was performed today as part of a comprehensive evaluation and provision of chronic care management and care coordination services.     SDOH (Social Determinants of Health) assessments and interventions performed:    Advanced Directives Status: Not addressed in this encounter.  CCM Care Plan  No Known Allergies  Outpatient Encounter Medications as of 08/01/2021  Medication Sig   acetaminophen (TYLENOL) 500 MG tablet Take 500-1,000 mg by mouth every 6 (six) hours as needed for mild pain.   AMBULATORY NON FORMULARY MEDICATION Medication Name: Audelia Hives Wheelchair   amLODipine (NORVASC) 10 MG tablet Take 1 tablet (10 mg total) by mouth daily.   COMFORT EZ INSULIN SYRINGE 31G X 5/16" 1 ML MISC    Cyanocobalamin (VITAMIN B-12 PO) Take 1 tablet by mouth daily.   diclofenac Sodium (VOLTAREN) 1 % GEL Apply 4 g topically 4 (four) times daily as needed (joint pain).   DULoxetine (CYMBALTA) 60 MG capsule Take 1 capsule by mouth  daily.   furosemide (LASIX) 20 MG tablet Take 2 tablets (40 mg total) by mouth daily.   gabapentin (NEURONTIN) 300 MG capsule Take 1,200 mg by mouth 3 (three) times daily.   hydrALAZINE (APRESOLINE) 50 MG tablet Take 1 tablet (50 mg total) by mouth every 8 (eight) hours.   hydrOXYzine (ATARAX/VISTARIL) 25 MG tablet Take 1 tablet (25 mg total) by mouth every 6 (six) hours as needed for anxiety.   ibuprofen (ADVIL) 200 MG tablet Take 400 mg by mouth every 6 (six) hours as needed for mild pain.   immune globulin, human, (GAMMAGARD S/D) 5 g injection Inject into the vein every 21 ( twenty-one) days.   insulin NPH Human (NOVOLIN N) 100 UNIT/ML injection Inject 35 Units into the skin at bedtime.   Insulin Pen Needle 31G X 5 MM MISC 1 Device by Does not apply route in the morning, at noon, in the evening, and at bedtime.   Insulin Regular Human (HUMULIN R) 100 UNIT/ML KwikPen Inject 14 Units into the skin 3 (three) times daily before meals. Uses [employer] Relion R insulin   losartan (COZAAR) 100 MG tablet TAKE ONE TABLET BY MOUTH DAILY   MAGNESIUM PO Take 1 tablet by mouth daily.   meclizine (ANTIVERT) 25 MG tablet Take 1 tablet (25 mg total) by mouth 3 (three) times daily as needed for dizziness.   meloxicam (MOBIC) 15 MG tablet Take 1 tablet (15 mg total) by mouth daily.   Menthol, Topical Analgesic, (ICY HOT EX) Apply 1 application topically as needed (pain).   metFORMIN (GLUCOPHAGE-XR) 500 MG 24 hr tablet Take  2 tablets (1,000 mg total) by mouth in the morning and at bedtime.   methocarbamol (ROBAXIN) 500 MG tablet Take 500 mg by mouth 4 (four) times daily as needed for muscle spasms.   Multiple Vitamin (MULTIVITAMIN) tablet Take 1 tablet by mouth daily.   NONFORMULARY OR COMPOUNDED ITEM Pt requesting a non powered will hair. (Patient taking differently: Pt requesting a non powered will chair.)   NONFORMULARY OR COMPOUNDED ITEM Power wheelchair   NONFORMULARY OR COMPOUNDED ITEM manual wheelchair    polyethylene glycol (MIRALAX / GLYCOLAX) 17 g packet Take 17 g by mouth daily as needed for mild constipation.   pravastatin (PRAVACHOL) 40 MG tablet Take 1 tablet (40 mg total) by mouth daily.   sildenafil (VIAGRA) 100 MG tablet Take 0.5-1 tablets (50-100 mg total) by mouth daily as needed for erectile dysfunction.   tamsulosin (FLOMAX) 0.4 MG CAPS capsule TAKE ONE CAPSULE BY MOUTH ONE TIME DAILY   traMADol (ULTRAM) 50 MG tablet 2 po q6 h prn (Patient taking differently: Take 100 mg by mouth every 6 (six) hours as needed. 2 po q6 h prn)   No facility-administered encounter medications on file as of 08/01/2021.    Patient Active Problem List   Diagnosis Date Noted   CIDP (chronic inflammatory demyelinating polyneuropathy) ([place]) 01/12/2021   Diabetes mellitus ([place]) 01/03/2021   Type 2 diabetes mellitus with hyperglycemia, with long-term current use of insulin ([place]) 01/03/2021   Lower extremity edema 11/28/2020   Chronic low back pain 11/28/2020   Psoriatic arthritis ([place]) 11/28/2020   Thoracic aortic aneurysm without rupture 11/28/2020   Bilateral leg weakness 11/05/2020   Acute left-sided low back pain with right-sided sciatica 07/19/2020   Hip pain, acute, left 07/05/2020   Tremor 07/05/2020   Weakness 07/05/2020   Balance problem 03/11/2020   Tinea cruris 03/11/2020   Cellulitis of left groin 03/11/2020   Acute pain of right shoulder 03/11/2020   Interstitial lung disease ([place]) 05/02/2017   Pansinusitis 09/26/2016   Diabetic polyneuropathy associated with diabetes mellitus due to underlying condition ([place]) 05/08/2016   Methamphetamine use disorder, severe, dependence ([place]) 02/11/2016   Substance induced mood disorder ([place]) 02/11/2016   Exertional dyspnea 11/30/2015   ADD (attention deficit disorder) 09/29/2015   Binge eating 09/29/2015   Syncope 05/05/2012   Diarrhea 05/05/2012   Panic attacks 05/05/2012   OSA (obstructive sleep apnea) 05/05/2012   HTN (hypertension)  05/05/2012   Dyslipidemia 05/05/2012   CAD (coronary artery disease)  cardiac cath with moderate disease in a septal branch of the ramus intermedius 04/01/2012   Chest pain 04/01/2012   Drug abuse and dependence ([place]) 04/01/2012   Family history of coronary artery disease 03/31/2012   Sleep apnea, on C-pap 03/31/2012   Hyperlipemia 03/30/2012   HTN (hypertension), benign 03/30/2012   Morbid obesity ([place]) 03/30/2012   DM type 2, uncontrolled, with neuropathy ([place]) 03/30/2012   Depression with suicidal ideation 03/30/2012    Conditions to be addressed/monitored: HTN and DMII.  Limited Social Support, Housing Barriers, Level of Care Concerns, ADL/IADL Limitations, Limited Access to Caregiver, Memory Deficits, and Lacks Knowledge of [employer].  Care Plan : LCSW Plan of Care  Updates made by Francis Gaines, LCSW since 08/01/2021 12:00 AM     Problem: Improve Quality of Life through [place] and Durable Medical Equipment.   Priority: High     Long-Range Goal: Improve Quality of Life through [place] and Durable Medical Equipment.   Start Date: 03/30/2021  Expected End Date: 08/29/2021  This Visit's Progress: On track  Recent Progress: On track  Priority: High  Note:   Current Barriers:   Patient with History of Falls/Fall Risk, Unsteady Gait, Impaired Mobility, Weakness, Tremor, Syncope, Balance Problem and Lower Extremity Edema needs assistance with initiating short-term rehabilitative services in a skilled nursing facility or long-term care in an assisted living facility. Clinical Goal(s):  Patient will receive assistance with initiating placement, whether it be assisted living, skilled level care, or a handicapped accessible apartment.   Interventions: Collaboration with Primary Care Physician, Dr. [name] regarding development and update of comprehensive plan of care as evidenced by provider attestation and co-signature. Patient  Goals/Self-Care Activities:  Continue to receive monthly telephone outreach calls from LCSW, in an effort to ensure that you have home health services and durable medical equipment necessary to live independently in your home. LCSW collaboration with Dennis Bast from [facility] [phone]), to confirm receipt of new orders for [place], Physical Therapy, Occupational Therapy, Speech Therapy and Social Work Services.   Continue to receive Infusion Therapy for treatment of Chronic Inflammatory Demyelinating Polyneuropathy, through Dr. Deniece Portela, Neurologist with [facility]. LCSW collaboration with Primary Care Physician, Dr. [name] to request order for manual wheelchair to be faxed to [facility] (# [phone]).   LCSW collaboration with representative from [employer] [phone]), to confirm order for power wheelchair, anticipated delivery date of 08/11/2021. Please contact LCSW directly (# [phone]) if you have questions, need assistance, or if additional social work needs are identified between now and our next scheduled telephone outreach call. Follow-Up:  08/29/2021 at 3:15 pm   [place] Licensed Clinical Social Worker [place] [phone]

## 2021-08-03 DIAGNOSIS — M5441 Lumbago with sciatica, right side: Secondary | ICD-10-CM | POA: Diagnosis not present

## 2021-08-03 DIAGNOSIS — E1121 Type 2 diabetes mellitus with diabetic nephropathy: Secondary | ICD-10-CM | POA: Diagnosis not present

## 2021-08-03 DIAGNOSIS — J849 Interstitial pulmonary disease, unspecified: Secondary | ICD-10-CM | POA: Diagnosis not present

## 2021-08-03 DIAGNOSIS — R69 Illness, unspecified: Secondary | ICD-10-CM | POA: Diagnosis not present

## 2021-08-03 DIAGNOSIS — G4733 Obstructive sleep apnea (adult) (pediatric): Secondary | ICD-10-CM | POA: Diagnosis not present

## 2021-08-03 DIAGNOSIS — I10 Essential (primary) hypertension: Secondary | ICD-10-CM | POA: Diagnosis not present

## 2021-08-03 DIAGNOSIS — E1142 Type 2 diabetes mellitus with diabetic polyneuropathy: Secondary | ICD-10-CM | POA: Diagnosis not present

## 2021-08-03 DIAGNOSIS — E7800 Pure hypercholesterolemia, unspecified: Secondary | ICD-10-CM | POA: Diagnosis not present

## 2021-08-03 DIAGNOSIS — I2510 Atherosclerotic heart disease of native coronary artery without angina pectoris: Secondary | ICD-10-CM | POA: Diagnosis not present

## 2021-08-03 DIAGNOSIS — E1136 Type 2 diabetes mellitus with diabetic cataract: Secondary | ICD-10-CM | POA: Diagnosis not present

## 2021-08-04 MED ORDER — NONFORMULARY OR COMPOUNDED ITEM: 0 refills | Status: AC

## 2021-08-04 NOTE — Addendum Note (Signed)
Addended by: Sanda Linger on: 08/04/2021 01:10 PM   Modules accepted: Orders

## 2021-08-06 DIAGNOSIS — I2510 Atherosclerotic heart disease of native coronary artery without angina pectoris: Secondary | ICD-10-CM | POA: Diagnosis not present

## 2021-08-06 DIAGNOSIS — J849 Interstitial pulmonary disease, unspecified: Secondary | ICD-10-CM | POA: Diagnosis not present

## 2021-08-06 DIAGNOSIS — E1121 Type 2 diabetes mellitus with diabetic nephropathy: Secondary | ICD-10-CM | POA: Diagnosis not present

## 2021-08-06 DIAGNOSIS — E1142 Type 2 diabetes mellitus with diabetic polyneuropathy: Secondary | ICD-10-CM | POA: Diagnosis not present

## 2021-08-06 DIAGNOSIS — I10 Essential (primary) hypertension: Secondary | ICD-10-CM | POA: Diagnosis not present

## 2021-08-06 DIAGNOSIS — M5441 Lumbago with sciatica, right side: Secondary | ICD-10-CM | POA: Diagnosis not present

## 2021-08-06 DIAGNOSIS — R69 Illness, unspecified: Secondary | ICD-10-CM | POA: Diagnosis not present

## 2021-08-06 DIAGNOSIS — E7800 Pure hypercholesterolemia, unspecified: Secondary | ICD-10-CM | POA: Diagnosis not present

## 2021-08-06 DIAGNOSIS — G4733 Obstructive sleep apnea (adult) (pediatric): Secondary | ICD-10-CM | POA: Diagnosis not present

## 2021-08-06 DIAGNOSIS — E1136 Type 2 diabetes mellitus with diabetic cataract: Secondary | ICD-10-CM | POA: Diagnosis not present

## 2021-08-07 DIAGNOSIS — E7800 Pure hypercholesterolemia, unspecified: Secondary | ICD-10-CM | POA: Diagnosis not present

## 2021-08-07 DIAGNOSIS — I2510 Atherosclerotic heart disease of native coronary artery without angina pectoris: Secondary | ICD-10-CM | POA: Diagnosis not present

## 2021-08-07 DIAGNOSIS — E1136 Type 2 diabetes mellitus with diabetic cataract: Secondary | ICD-10-CM | POA: Diagnosis not present

## 2021-08-07 DIAGNOSIS — I10 Essential (primary) hypertension: Secondary | ICD-10-CM | POA: Diagnosis not present

## 2021-08-07 DIAGNOSIS — R69 Illness, unspecified: Secondary | ICD-10-CM | POA: Diagnosis not present

## 2021-08-07 DIAGNOSIS — G4733 Obstructive sleep apnea (adult) (pediatric): Secondary | ICD-10-CM | POA: Diagnosis not present

## 2021-08-07 DIAGNOSIS — M5441 Lumbago with sciatica, right side: Secondary | ICD-10-CM | POA: Diagnosis not present

## 2021-08-07 DIAGNOSIS — E1121 Type 2 diabetes mellitus with diabetic nephropathy: Secondary | ICD-10-CM | POA: Diagnosis not present

## 2021-08-07 DIAGNOSIS — J849 Interstitial pulmonary disease, unspecified: Secondary | ICD-10-CM | POA: Diagnosis not present

## 2021-08-07 DIAGNOSIS — E1142 Type 2 diabetes mellitus with diabetic polyneuropathy: Secondary | ICD-10-CM | POA: Diagnosis not present

## 2021-08-08 ENCOUNTER — Ambulatory Visit (INDEPENDENT_AMBULATORY_CARE_PROVIDER_SITE_OTHER): Payer: Medicare HMO | Admitting: Pharmacist

## 2021-08-08 ENCOUNTER — Other Ambulatory Visit: Payer: Self-pay | Admitting: Pharmacist

## 2021-08-08 DIAGNOSIS — I10 Essential (primary) hypertension: Secondary | ICD-10-CM

## 2021-08-08 DIAGNOSIS — F418 Other specified anxiety disorders: Secondary | ICD-10-CM

## 2021-08-08 DIAGNOSIS — Z794 Long term (current) use of insulin: Secondary | ICD-10-CM

## 2021-08-08 DIAGNOSIS — E785 Hyperlipidemia, unspecified: Secondary | ICD-10-CM

## 2021-08-08 NOTE — Chronic Care Management (AMB) (Signed)
Chronic Care Management Pharmacy Note  08/08/2021 Name:  Tyler Brewer MRN:  [id number] DOB:  Jul 11, 1961  Subjective: Tyler Brewer is an 60 y.o. year old male who is a primary patient of [name], DO.  The CCM team was consulted for assistance with disease management and care coordination needs.    Engaged with patient by telephone for  follow up   in response to provider referral for pharmacy case management and/or care coordination services.   Consent to Services:  The patient was given information about Chronic Care Management services, agreed to services, and gave verbal consent prior to initiation of services.  Please see initial visit note for detailed documentation.   Patient Care Team: Carollee Herter, Alferd Apa, DO as PCP - General (Family Medicine) Alda Berthold, DO as Consulting Physician (Neurology) Lajuana Matte, MD as Consulting Physician (Cardiothoracic Surgery) Marlaine Hind, MD as Consulting Physician (Physical Medicine and Rehabilitation) Luretha Rued, RN as Case Manager Cherre Robins, RPH-CPP (Pharmacist) [name], Melanie Crazier, MD as Consulting Physician (Endocrinology) Saporito, Maree Erie, LCSW as Social Worker (Licensed Clinical Social Worker) Haddix, Thomasene Lot, MD as Consulting Physician (Orthopedic Surgery)  Recent office visits: 06/30/2021 - Fam Med (Dr Etter Sjogren Cheri Rous) Seen for edema / pain / anxiety. Furosemide 64m for a few days then back to 271mdaily; Refilled hydroxyzine 2551mp to every 6 hours as needed. Refilled tramadol. Has appt to see pain management soon 05/18/2021 - [facility] ([facility]. Noted to have increased anxiety, panic attackd and insomnia. Referred to pulmonology for OSA. Referral to urology for testicle pain. Recommended f/u with psychiatry 01/12/2021 - PCP (Dr LowEtter Sjogren/U visit; recommended COVID vaccine and colonoscopy; no medication changes.    Recent consult visits: 07/27/2021 - [facility] 07[facility]  07/11/2021 - Endo (Dr ShaKelton Pillar/U diabetes, Decreased NPH does to 30 units daily. Increased Reg insuiln to 14 units wiht breakfast; 16 units with lunch and evening meals. Advised to stop nighttime snacking 06/27/2021 - Neurology (Dr McGMikle Boswortheuropathy and worsening lower extremity weakness. continue IVIG 1 g/kg q 3 weeks, given over 2 [facility], recommend strict glycemic control as hyperglycemia can worsen underlying neuropathy. CMP reviewed, Cr WNL, glucose elevated, Na slightly low in the setting of hyperglycemia. CBC unremarkable. Continue Gabapentin and Duloxetine for neuropathic pain 07/04/2021 and 11/16/20222 - CIPD infusion of IVIG 06/13/2021 and 06/14/2021- [facility] [place] [facility] [place] [facility].  03/27/2021 - [place] ([place]08/03/2021 - Neurology - (Dr PatPosey Prontoolyrediculoneuropathy. No medication changes.  03/24/2021 - [facility]rology ([facility] lower [facility] weakness. Repeat EMG/NCS to assess for diabetic amyotrophy versus demyelinating process. Labs checked. No med changes. F/U 3 months.  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 No medication changes. Recommended optimize control of DM and physical therapy to improve strength.    H[facility] visits: [facility]  Prescribed Miralax 17 grams daily as needed. Ultrasound negative for hydronephrosis.  06/24/2021 - ED - [facility] - for edema per EMS report. Unable to see notes from ED visit.  05/14/2021 - ED Visit - Seen for constipation. Recommended Miralax as needed.   05/12/2021 ED Visit - Seen for lumbar strain  and contusion of hip and thigh.  05/11/2021 - ED Visit -  fall with fracture of medial malleolus of left tibia and of proximal phalanx of left great toe.  04/26/2021 to 05/05/2021 hospital admission at [place] for worsening weakness and falls. Received 5 days of IVIG treatment with gradual improvement in lower extremity weakness.  Medications started at discharge:  IVIG every 2 weeks, duloxetine 28m daily Medications changed at discharge:  gabapentin 3064m- take 4 caps = 120059m times a day; Novolin N - 20 units daily at bedtime; Novolin R 7 units 3 times a day before meals. Losartan 100m109mily. Tramadol 50g every 6 hours as needed for pain for up to 14 days.  Medications stopped at discharge:  ciprofloxacin, hydralazine, hydrocodone/APAP, Novolog Mix 70/30; nicotine 21mg9mches; nystatin powder, pregabalin 75mg,77mtra DS; TresibTyler Aas1/2022 [facility]. Performed scrotal U/S to r/o epididymitis or other pathology. Only showed small bilateral hydroceles. No med changes.  04/01/2021 - [facility]. Noted also to have urinary retention. MRI showed no changes or spinal cord compression. GIven tramadol, lorazepam and meloxicam in hospital. Patient was noted to be upset at dicharge because he wanted to be admitted. 03/16/2021 - ED Visit - for neuropathy - no med changes.    Objective:  Lab Results  Component Value Date   CREATININE 0.87 05/18/2021   CREATININE 1.05 05/15/2021   CREATININE 0.70 04/20/2021    Lab Results  Component Value Date   HGBA1C 7.5 (H) 05/18/2021   Last diabetic Eye exam: No results found for: HMDIABEYEEXA  Last diabetic Foot exam: No results found for: HMDIABFOOTEX      Component Value Date/Time   CHOL 140 05/18/2021 1612   TRIG 104.0 05/18/2021 1612   HDL 56.60 05/18/2021 1612   CHOLHDL 2 05/18/2021 1612   VLDL 20.8 05/18/2021 1612   LDLCALC 63 05/18/2021 1612   LDLDIRECT 124.0 05/02/2017  1507    Hepatic Function Latest Ref Rng & Units 05/18/2021 05/15/2021 04/20/2021  Total Protein 6.0 - 8.3 g/dL 7.3 8.1 6.6  Albumin 3.5 - 5.2 g/dL 3.9 3.7 3.1(L)  AST 0 - 37 U/L '11 18 28  ' ALT 0 - 53 U/L '25 26 23  ' Alk Phosphatase 39 - 117 U/L 91 66 78  Total Bilirubin 0.2 - 1.2 mg/dL 0.3 0.6 1.1    Lab Results  Component Value Date/Time   TSH 2.57 02/14/2021 02:19 PM   TSH 2.948 11/05/2020 07:18 AM   TSH 2.66 03/10/2020 10:06 AM    CBC Latest Ref Rng & Units 05/18/2021 05/15/2021 04/20/2021  WBC 4.0 - 10.5 K/uL 6.3 6.6 5.8  Hemoglobin 13.0 - 17.0 g/dL 13.1 13.7 12.1(L)  Hematocrit 39.0 - 52.0 % 38.3(L) 41.3 36.4(L)  Platelets 150.0 - 400.0 K/uL 299.0 268 300    No results found for: VD25OH  Clinical ASCVD: Yes  The 10-year ASCVD risk score (Arnett DK, et al., 2019) is: 12.9%   Values used to calculate the score:     Age: 54 yea63     Sex: Male     Is Non-Hispanic African American: No     Diabetic: Yes     Tobacco smoker: No     Systolic Blood Pressure: 147 mm443    Is BP treated: Yes     HDL Cholesterol: 56.6 mg/dL     Total Cholesterol: 140 mg/dL  Social History   Tobacco Use  Smoking Status Former   Packs/day: 0.50   Types: Cigarettes   Quit date: 04/27/2021   Years since quitting: 0.2  Smokeless Tobacco Never   BP Readings from Last 3 Encounters:  07/21/21 (!) 204/105  07/11/21 (!) 144/86  06/30/21 140/90   Pulse Readings from Last 3 Encounters:  07/21/21 84  07/11/21 97  06/30/21 90   Wt Readings from Last 3 Encounters:  07/21/21 300 lb (136.1 kg)  05/15/21 290 lb (131.5 kg)  05/12/21 290 lb (131.5 kg)    Assessment: Review of patient past medical history, allergies, medications, health status, including review of consultants reports, laboratory and other test data, was performed as part of comprehensive evaluation and provision of chronic care management services.   SDOH:  (Social Determinants of Health) assessments and interventions performed:      CCM Care Plan  No Known Allergies  Medications Reviewed Today     Reviewed by Cherre Robins, RPH-CPP (Pharmacist) on 08/08/21 at 1620  Med List Status: <None>   Medication Order Taking? Sig Documenting Provider Last Dose Status Informant  acetaminophen (TYLENOL) 500 MG tablet [id number] Yes Take 500-1,000 mg by mouth every 6 (six) hours as needed for mild pain. [provider] Taking Active Self  Camillo Flaming MEDICATION [id number] Yes Medication Name: [facility] Wheelchair [place], [place], [place] Taking Active   amLODipine (NORVASC) 10 MG tablet [id number] Yes Take 1 tablet (10 mg total) by mouth daily. [name], DO Taking Active   COMFORT EZ INSULIN SYRINGE 31G X 5/16" 1 ML MISC [id number] Yes  [provider] Taking Active Self  Cyanocobalamin (VITAMIN B-12 PO) [id number] Yes Take 1 tablet by mouth daily. [provider] Taking Active Self  diclofenac Sodium (VOLTAREN) 1 % GEL [id number] Yes Apply 4 g topically 4 (four) times daily as needed (joint pain). [provider] Taking Active Self  DULoxetine (CYMBALTA) 60 MG capsule [id number] Yes Take 1 capsule by mouth daily. [provider] Taking Active Self  furosemide (LASIX) 20 MG tablet [id number] Yes Take 2 tablets (40 mg total) by mouth daily. Carollee Herter, Kendrick Fries R, DO Taking Active   hydrALAZINE (APRESOLINE) 50 MG tablet [id number] Yes TAKE 1 TABLET(50 MG) BY MOUTH EVERY 8 HOURS [name] R, DO Taking Active   hydrOXYzine (ATARAX/VISTARIL) 25 MG tablet [id number] Yes Take 1 tablet (25 mg total) by mouth every 6 (six) hours as needed for anxiety. [name] R, DO Taking Active   ibuprofen (ADVIL) 200 MG tablet [id number] Yes Take 400 mg by mouth every 6 (six) hours as needed for mild pain. [provider] Taking Active Self  immune globulin, human, (GAMMAGARD S/D) 5 g injection [id number] Yes Inject into the vein every 21 ( twenty-one) days. [provider] Taking Active   insulin NPH Human (NOVOLIN N) 100 UNIT/ML injection [id number] Yes Inject 35 Units into the skin at bedtime. [provider] Taking Active Self  Insulin Pen Needle 31G X 5 MM MISC [id number] Yes 1 Device by Does not apply route in the morning, at noon, in the evening, and at bedtime. [name], Melanie Crazier, MD Taking Active Self  Insulin Regular Human (HUMULIN R) 100 UNIT/ML KwikPen [id number] Yes Inject 14 Units into the skin 3 (three) times daily before meals. Uses [employer] Relion R insulin [provider] Taking Active   losartan (COZAAR) 100 MG tablet [id number] Yes TAKE ONE TABLET BY MOUTH DAILY Carollee Herter, Alferd Apa,  DO Taking Active   MAGNESIUM PO [id number] Yes Take 1 tablet by mouth daily. [provider] Taking Active   meclizine (ANTIVERT) 25 MG tablet [id number] Yes Take 1 tablet (25 mg total) by mouth 3 (three) times daily as needed for dizziness. Molpus, John, MD Taking Active            Med Note Thayer Ohm, Debria Garret Apr 20, 2021  2:54 PM)    meloxicam (MOBIC) 15 MG tablet [id number] Yes Take 1 tablet (15 mg total) by mouth daily. Charlett Blake, MD Taking Active   Menthol, Topical Analgesic, (ICY HOT EX) [id number] Yes Apply 1 application topically as needed (pain). [provider] Taking Active Self  metFORMIN (GLUCOPHAGE-XR) 500 MG 24 hr tablet [id number] Yes Take 2 tablets (1,000 mg total) by mouth in the morning and at bedtime. [name], DO Taking Active Self  methocarbamol (ROBAXIN) 500 MG tablet [id number] Yes Take 500 mg by mouth 4 (four) times daily as needed for muscle spasms. [provider] Taking Active   Multiple Vitamin (MULTIVITAMIN) tablet [id number] Yes Take 1 tablet by mouth daily. [provider] Taking Active Self  NONFORMULARY OR COMPOUNDED ITEM [id number] Yes Power wheelchair [name], [place] Taking Active   NONFORMULARY OR COMPOUNDED ITEM [id number] Yes [facility]  wheelchair Carollee Herter, [place] R, [place] Taking Active   polyethylene glycol (MIRALAX / GLYCOLAX) 17 g packet [id number] Yes Take 17 g by mouth daily as needed for mild constipation. Davonna Belling, MD Taking Active   pravastatin (PRAVACHOL) 40 MG tablet [id number] Yes Take 1 tablet (40 mg total) by mouth daily. [name], DO Taking Active Self  sildenafil (VIAGRA) 100 MG tablet [id number] Yes Take 0.5-1 tablets (50-100 mg total) by mouth daily as needed for erectile dysfunction. [name] R, DO Taking Active   tamsulosin (FLOMAX) 0.4 MG CAPS capsule [id number] Yes TAKE ONE CAPSULE BY MOUTH ONE TIME DAILY [name], DO Taking Active   traMADol (ULTRAM) 50 MG tablet [id number] Yes 2 po q6 h prn  Patient taking differently: Take 100 mg by mouth every 6 (six) hours as needed. 2 po q6 h prn   [name], DO Taking Active             Patient Active Problem List   Diagnosis Date Noted   CIDP (chronic inflammatory demyelinating polyneuropathy) ([place]) 01/12/2021   Diabetes mellitus ([place]) 01/03/2021   Type 2 diabetes mellitus with hyperglycemia, with long-term current use of insulin ([place]) 01/03/2021   Lower extremity edema 11/28/2020   Chronic low back pain 11/28/2020   Psoriatic arthritis ([place]) 11/28/2020   Thoracic aortic aneurysm without rupture 11/28/2020   Bilateral leg weakness 11/05/2020   Acute left-sided low back pain with right-sided sciatica 07/19/2020   Hip pain, acute, left 07/05/2020   Tremor 07/05/2020   Weakness 07/05/2020   Balance problem 03/11/2020   Tinea cruris 03/11/2020   Cellulitis of left groin 03/11/2020   Acute pain of right shoulder 03/11/2020   Interstitial lung disease ([place]) 05/02/2017   Pansinusitis 09/26/2016   Diabetic polyneuropathy associated with diabetes mellitus due to underlying condition ([place]) 05/08/2016   Methamphetamine use disorder, severe, dependence ([place]) 02/11/2016   Substance induced mood disorder ([place])  02/11/2016   Exertional dyspnea 11/30/2015   ADD (attention deficit disorder) 09/29/2015   Binge eating 09/29/2015   Syncope 05/05/2012   Diarrhea 05/05/2012   Panic attacks 05/05/2012   OSA (obstructive  sleep apnea) 05/05/2012   HTN (hypertension) 05/05/2012   Dyslipidemia 05/05/2012   CAD (coronary artery disease)  cardiac cath with moderate disease in a septal branch of the ramus intermedius 04/01/2012   Chest pain 04/01/2012   Drug abuse and dependence ([place]) 04/01/2012   Family history of coronary artery disease 03/31/2012   Sleep apnea, on C-pap 03/31/2012   Hyperlipemia 03/30/2012   HTN (hypertension), benign 03/30/2012   Morbid obesity ([place]) 03/30/2012   DM type 2, uncontrolled, with neuropathy ([place]) 03/30/2012   Depression with suicidal ideation 03/30/2012    Immunization History  Administered Date(s) Administered   Tdap 08/20/2014    Conditions to be addressed/monitored: CAD, HTN, HLD, DMII and chronic pain (low back) interstitial lung disease; OSA  Care Plan : General Pharmacy (Adult)  Updates made by Cherre Robins, RPH-CPP since 08/08/2021 12:00 AM     Problem: Medication management and Chronic Care Management, support and education for the following conditions - HTN, Type2 DM with long term insuiln use; hyperlipidemia; neuropathy; OSA; institial lung disease;   Priority: High  Onset Date: 01/06/2021  Note:   Current Barriers:  Difficulty with affording treatment regimen -improved Unable to independently monitor therapeutic efficacy - improved Unable to achieve control of type 2 DM and HTN - improved Does not adhere to prescribed medication regimen - improved Frequent ED visits - improved  Pharmacist Clinical Goal(s):  Over the next 45 days, patient will verbalize ability to afford treatment regimen achieve adherence to monitoring guidelines and medication adherence to achieve therapeutic efficacy achieve control of type 2 DM and HTN as evidenced by  attainment of goals listed below Decrease frequency of ED visits  through collaboration with PharmD and provider.   Interventions: 1:1 collaboration with Carollee Herter, Alferd Apa, DO regarding development and update of comprehensive plan of care as evidenced by provider attestation and co-signature Inter-disciplinary care team collaboration (see longitudinal plan of care) Comprehensive medication review performed; medication list updated in electronic medical record   Interventions: 1:1 collaboration with Carollee Herter, Alferd Apa, DO regarding development and update of comprehensive plan of care as evidenced by provider attestation and co-signature Inter-disciplinary care team collaboration (see longitudinal plan of care) Comprehensive medication review performed; medication list updated in electronic medical record  Diabetes: Lab Results  Component Value Date   HGBA1C 7.5 (H) 05/18/2021  A1c 03/23/2021 at [facility] was 6.9%  Controlled - (goal A1c <7%) Much improved BG since started using Libre CGM Reports recent blood glucose at home has been 70's to 150's most days. Current treatment: Relion NPH insulin- inject 35 units at bedtime Relion Regular insulin - inject 14 units prior to each meal.   Metformin XR 582m - 2 Tablets with Breakfast and 2 tablets with Supper (last refilled 04/20/2021 for 90 DS)  Interventions / Recommendations Recommended continue to take medications as directed by Dr SKelton PillarContinue to check BG frequently during day with [facility] Plan to consider restarting TTyler Aas/ Novolog in 2023 when copay will be $35/30 days the full year of insulin therapy (no coverage gap)   Hypertension: BP Readings from Last 3 Encounters:  07/21/21 (!) 204/105  07/11/21 (!) 144/86  06/30/21 140/90  Recent blood pressure elevate when presented to ED with pain - BP goal <140/90 Not checking BP at home regularly but he plans to purchase blood pressure cuff in 2023 when he  starts to receive $75 per quarter with [employer] plan to use on over-the-counter purchases.  Current treatment: Amlodipine 145mdaily  Hydralazine 62m every 8 hours Losartan 1033mdaily  Denies dizziness  Interventions:  Counseled on importance of maintaining blood pressure at goal to prevent strokes and kidney damage Continue current blood pressure lowering regimen Reminded patient that refill for hydralazine is due. He states that he has about 1 week left at home but cannot afford to fill until he receives his check next week. We did review cost of hydralazine on his [employer] plan and thru GoodRx. GoodRx price for 30 days is around $12. Patient planning to get 30 days on GoodRx   Hyperlipidemia / Heart Disease: Lipid Panel  Lab Results  Component Value Date   CHOL 140 05/18/2021   HDL 56.60 05/18/2021   LDLCALC 63 05/18/2021   LDLDIRECT 124.0 05/02/2017   TRIG 104.0 05/18/2021   CHOLHDL 2 05/18/2021  Controlled - LDL goal <70 Current treatment: Pravastatin 4031mt bedtime Interventions:  Counseled on LDL goals Recommended continue current regimen for cholesterol  Chronic Pain / Leg Weakness / CIDP: Currently not controlled but improving per patient since started IVIG treatment Recent IVIG treatment for CIDP increased blood pressure. In future will receive treatment over 2 days every 30 days.  Patient reports he was doing better until he fell and fractured his foot.  Current treatment:  Methocarbamol 500m9mtake 1 tablet 4 times a day as needed for muscle spasms Voltaren Gel 1% -  apply to area of pain up to 4 times a day as needed Gabapentin 300mg84make 4 tablets = 1200mg 78mmes a day (increased during recent hospitalization) Duloxetine 60mg d74m in morning IVIG infusion every 30 days over 2 days Patient states that gabapentin was increased to 1200mg 4 47ms a day by Dr McGhee bMikle Bosworthon't see report of that change. He [facility] and Rx sent 06/27/21 was for 1200mg 3 t61m a day.  Interventions:  Continue current regimen for pain  Encouraged patient to follow up with neurology and pain management [facility] with Dr Mcghee reMikle Bosworthg if he should take 3 times a day or 4 times  a day.   Anxiety / Insomnia:  Uncontrolled Current therapy:   Hydroxyzine 25mg take42mto every 6 hour as needed - last filled #30 06/30/2021 Medication tried in past:  Trazodone - made him feel like a zombie Hydroxyzine - made him sleepy ("like a benadryl on steroids") Ativan - worked well Citalopram - not helpful Clonazepam - made him moody Escitalopram - not helpful Nortriptyline - made him sleep Interventions: (addressed at previous visit) Continue to take duloxetine in morning.  Patient has been referred to pulmonology for OSA testing and also referred back to psychiatry by PCP.  Patient requested refill on hydroxyzine - request forwarded to PCP  Health Maintenance:  Reviewed vaccination history and discussed benefits of annual flu, COVID booster and Shingrix vaccinations Patient declined flu vaccine He is considering COVID booster Plans to get Shingrix in 2023 when coverage thru Medicare is expected to improve.   Medication Management:  Reviewed refill history. Only see that he is due to refill amlodipine  Interventions:   Will continue to follow adherence and assist patient with refills.  Reminded patient that he needs to answer calls for regular check ins so that we can order / request refills BEFORE he runs out of medication.  Patient has received recent mail order refills. Endorses  he has refills for all other medications to last until January except hydroxyzine and hydralazine.   Patient Goals/Self-Care Activities Over the next 90 days, patient will:  take medications as prescribed focus on medication adherence by utilizing  pharmacy service that delivers medications to his home, check glucose before meals and 2 hour after meals using CGM, document, and provide at future appointments collaborate with provider on medication access solutions and refills   Follow up call planned In 4 to 6 weeks      Medication Assistance: None needed at this time .  Patient's preferred pharmacy is:  [id number] - [place], [place] - [address] [place] [address] Phone: [phone] Fax: [phone]  [facility] [place] [place] [facility] [address] [place] [address] Phone: [phone] Fax: [phone]    Follow Up:  Patient agrees to Care Plan and Follow-up.  Plan: The care management team will reach out to the patient again in 4 weeks.   Cherre Robins, PharmD Clinical Pharmacist [facility] [phone]

## 2021-08-08 NOTE — Telephone Encounter (Signed)
Patient requested RF on hydroxyzine during Chronic Care Management phone follow up today Last filled #30 (7 day supply) SIG: 1 tablet every 6 hours as needed for anxiety Last fill date: 06/30/2021 Forwarding to PCP / PCP's refill pool for review

## 2021-08-08 NOTE — Patient Instructions (Signed)
Mr. Pepitone It was a pleasure speaking with you today.  I have attached a summary of our visit today and information about your health goals.   Our next appointment is by telephone on 09/21/2021 at 3:45pm  Please call the care guide team at [phone] if you need to cancel or reschedule your appointment.   If you have any questions or concerns, please feel free to contact me either at the phone number below or with a MyChart message.   Keep up the good work!  Cherre Robins, PharmD Clinical Pharmacist [facility] [phone] (direct line)  [phone] (main office number)   Current Barriers:  Unable to independently afford treatment regimen - improving Unable to independently monitor therapeutic efficacy - improving Unable to achieve control of type 2 diabetes and high blood pressure / hypertension - improving Does not adhere to prescribed medication regimen - improving Frequent ED visits - improving  Pharmacist Clinical Goal(s):  Over the next 45 days, patient will verbalize ability to afford treatment regimen achieve adherence to monitoring guidelines and medication adherence to achieve therapeutic efficacy achieve control of type 2 diabetes and high blood pressure / hypertension as evidenced by attainment of goals listed below Decrease frequency of ED visits through collaboration with PharmD and provider.   Interventions: 1:1 collaboration with Carollee Herter, Alferd Apa, DO regarding development and update of comprehensive plan of care as evidenced by provider attestation and co-signature Inter-disciplinary care team collaboration (see longitudinal plan of care) Comprehensive medication review performed; medication list updated in electronic medical record    Diabetes: Lab Results  Component Value Date   HGBA1C 7.5 (H) 05/18/2021   A1c 03/23/2021 at [facility] was 6.9%  Controlled - (goal A1c <7%) Much improved BG since started using  Libre and taking insuiln regularly States cost of insulin still difficult but he cannot find application I sent for patient assistnace.  Current treatment: Relion NPH insulin- inject 35 units at bedtime Relion Regular insulin - inject 14 units prior to each meal.   Metformin XR 500mg  - 2 Tablets with Breakfast and 2 tablets with Supper (last refilled 04/20/2021 for 90 DS)  Interventions / Recommendations Recommended continue to take medications as directed by Dr [name] Continue to check BG frequently during day with [facility].. Plan to consider restarting Tyler Aas / Novolog in 2023 when copay will be $35/30 days the full year of insulin therapy (no coverage gap)   Hypertension: BP Readings from Last 3 Encounters:  07/21/21 (!) 204/105  07/11/21 (!) 144/86  06/30/21 140/90   Blood pressure not consistently at goal - BP goal <140/90 Current treatment: Amlodipine 10mg  daily  Hydralazine 50mg  every 8 hours Losartan 100mg  daily  Interventions:  Counseled on importance of getting blood pressure to goal to prevent strokes and kidney damage Reviewed refill history.  Due to get hydralazine - Good Rx price is around $12 / 30 days Continue current blood pressure lowering regimen   Hyperlipidemia / Heart Disease: Lipid Panel  Lab Results  Component Value Date   CHOL 140 05/18/2021   HDL 56.60 05/18/2021   LDLCALC 63 05/18/2021   LDLDIRECT 124.0 05/02/2017   TRIG 104.0 05/18/2021   CHOLHDL 2 05/18/2021   Controlled - LDL goal <70 Current treatment: Pravastatin 40mg  at bedtime Interventions:  Counseled on LDL goals Recommended continue current regimen for cholesterol  Chronic Pain / Leg Weakness: Currently not controlled Patient has had several ED visits for pain and weakness in the last month.  Has appointment with pain management 02/16/2021. Current treatment:  Tramadol 50mg  - take 1 or 2 tablets up to every 8 hours as needed Methocarbamol 500mg  - take 1 tablet 4 times a  day as needed for muscle spasms Voltaren Gel 1% -  apply to area of pain up to 4 times a day as needed Gabapentin 300mg  - take 4 tablets = 1200mg  3 times a day (increased during recent hospitalization) Duloxetine 60mg  at night Interventions:  Continue current regimen for pain  Encouraged patient to follow up with neurology and pain management Recommended change duloxetine to take in morning to see if change helps with sleep Check with Dr Mikle Bosworth about gabapentin dose.   Health Maintenance:  Reviewed vaccination history and discussed benefits of annual flu, COVID booster and Shingrix vaccinations Patient declined flu vaccine He is considering COVID booster Plans to get Shingrix in 2023 when coverage thru Medicare is expected to improve.   Medication Management:  Reviewed refill history.  [facility] Interventions:   Will continue to follow adherence and assist patient with refills.   Forwarded request for hydroxyzine refill to Dr Etter Sjogren  Patient Goals/Self-Care Activities Over the next 90 days, patient will:  take medications as prescribed focus on medication adherence by utilizing pharmacy service that delivers medications to his home, check glucose before meals and 2 hour after meals using CGM, document, and provide at future appointments collaborate with provider on medication access solutions   Follow up call planned in  4 to 6 weeks Patient verbalizes understanding of instructions provided today and agrees to view in [place].

## 2021-08-09 MED ORDER — HYDROXYZINE HCL 25 MG PO TABS: 25.0000 mg | ORAL_TABLET | Freq: Four times a day (QID) | ORAL | 0 refills | Status: DC | PRN

## 2021-08-11 DIAGNOSIS — J849 Interstitial pulmonary disease, unspecified: Secondary | ICD-10-CM | POA: Diagnosis not present

## 2021-08-11 DIAGNOSIS — E1121 Type 2 diabetes mellitus with diabetic nephropathy: Secondary | ICD-10-CM | POA: Diagnosis not present

## 2021-08-11 DIAGNOSIS — E1136 Type 2 diabetes mellitus with diabetic cataract: Secondary | ICD-10-CM | POA: Diagnosis not present

## 2021-08-11 DIAGNOSIS — M5441 Lumbago with sciatica, right side: Secondary | ICD-10-CM | POA: Diagnosis not present

## 2021-08-11 DIAGNOSIS — E7800 Pure hypercholesterolemia, unspecified: Secondary | ICD-10-CM | POA: Diagnosis not present

## 2021-08-11 DIAGNOSIS — E1142 Type 2 diabetes mellitus with diabetic polyneuropathy: Secondary | ICD-10-CM | POA: Diagnosis not present

## 2021-08-11 DIAGNOSIS — I2510 Atherosclerotic heart disease of native coronary artery without angina pectoris: Secondary | ICD-10-CM | POA: Diagnosis not present

## 2021-08-11 DIAGNOSIS — R69 Illness, unspecified: Secondary | ICD-10-CM | POA: Diagnosis not present

## 2021-08-11 DIAGNOSIS — G4733 Obstructive sleep apnea (adult) (pediatric): Secondary | ICD-10-CM | POA: Diagnosis not present

## 2021-08-11 DIAGNOSIS — I10 Essential (primary) hypertension: Secondary | ICD-10-CM | POA: Diagnosis not present

## 2021-08-15 DIAGNOSIS — G6181 Chronic inflammatory demyelinating polyneuritis: Secondary | ICD-10-CM | POA: Diagnosis not present

## 2021-08-16 DIAGNOSIS — G6181 Chronic inflammatory demyelinating polyneuritis: Secondary | ICD-10-CM | POA: Diagnosis not present

## 2021-08-17 ENCOUNTER — Telehealth: Payer: Self-pay | Admitting: Family Medicine

## 2021-08-17 DIAGNOSIS — I10 Essential (primary) hypertension: Secondary | ICD-10-CM | POA: Diagnosis not present

## 2021-08-17 DIAGNOSIS — M5441 Lumbago with sciatica, right side: Secondary | ICD-10-CM | POA: Diagnosis not present

## 2021-08-17 DIAGNOSIS — E1121 Type 2 diabetes mellitus with diabetic nephropathy: Secondary | ICD-10-CM | POA: Diagnosis not present

## 2021-08-17 DIAGNOSIS — I2510 Atherosclerotic heart disease of native coronary artery without angina pectoris: Secondary | ICD-10-CM | POA: Diagnosis not present

## 2021-08-17 DIAGNOSIS — E1136 Type 2 diabetes mellitus with diabetic cataract: Secondary | ICD-10-CM | POA: Diagnosis not present

## 2021-08-17 DIAGNOSIS — J849 Interstitial pulmonary disease, unspecified: Secondary | ICD-10-CM | POA: Diagnosis not present

## 2021-08-17 DIAGNOSIS — R69 Illness, unspecified: Secondary | ICD-10-CM | POA: Diagnosis not present

## 2021-08-17 DIAGNOSIS — E7800 Pure hypercholesterolemia, unspecified: Secondary | ICD-10-CM | POA: Diagnosis not present

## 2021-08-17 DIAGNOSIS — E1142 Type 2 diabetes mellitus with diabetic polyneuropathy: Secondary | ICD-10-CM | POA: Diagnosis not present

## 2021-08-17 DIAGNOSIS — G4733 Obstructive sleep apnea (adult) (pediatric): Secondary | ICD-10-CM | POA: Diagnosis not present

## 2021-08-17 NOTE — Telephone Encounter (Signed)
FYI for Dr. Etter Sjogren from [place] [facility] [place]:  He missed his appointment on 12/27 due to an infusion treatment.

## 2021-08-18 ENCOUNTER — Telehealth: Payer: Self-pay

## 2021-08-18 ENCOUNTER — Telehealth: Payer: Medicare HMO

## 2021-08-18 DIAGNOSIS — E1121 Type 2 diabetes mellitus with diabetic nephropathy: Secondary | ICD-10-CM | POA: Diagnosis not present

## 2021-08-18 DIAGNOSIS — M5441 Lumbago with sciatica, right side: Secondary | ICD-10-CM | POA: Diagnosis not present

## 2021-08-18 DIAGNOSIS — R69 Illness, unspecified: Secondary | ICD-10-CM | POA: Diagnosis not present

## 2021-08-18 DIAGNOSIS — E7800 Pure hypercholesterolemia, unspecified: Secondary | ICD-10-CM | POA: Diagnosis not present

## 2021-08-18 DIAGNOSIS — E1136 Type 2 diabetes mellitus with diabetic cataract: Secondary | ICD-10-CM | POA: Diagnosis not present

## 2021-08-18 DIAGNOSIS — I2510 Atherosclerotic heart disease of native coronary artery without angina pectoris: Secondary | ICD-10-CM | POA: Diagnosis not present

## 2021-08-18 DIAGNOSIS — J849 Interstitial pulmonary disease, unspecified: Secondary | ICD-10-CM | POA: Diagnosis not present

## 2021-08-18 DIAGNOSIS — E1142 Type 2 diabetes mellitus with diabetic polyneuropathy: Secondary | ICD-10-CM | POA: Diagnosis not present

## 2021-08-18 DIAGNOSIS — I10 Essential (primary) hypertension: Secondary | ICD-10-CM | POA: Diagnosis not present

## 2021-08-18 DIAGNOSIS — G4733 Obstructive sleep apnea (adult) (pediatric): Secondary | ICD-10-CM | POA: Diagnosis not present

## 2021-08-18 NOTE — Telephone Encounter (Signed)
°  Care Management   Follow Up Note   08/18/2021 Name: Tyler Brewer MRN: [id number] DOB: 07/07/1961   Referred by: [name], DO Reason for referral : Chronic Care Management   An unsuccessful telephone outreach was attempted today. The patient was referred to the case management team for assistance with care management and care coordination.  A HIPPA compliant phone message was left for the patient providing contact information and requesting a return call.   Follow Up Plan: Care Management Team will outreach within the next 30 days.  Thea Silversmith, RN, MSN, BSN, CCM Care Management Coordinator [facility] [phone]

## 2021-08-19 DIAGNOSIS — Z794 Long term (current) use of insulin: Secondary | ICD-10-CM | POA: Diagnosis not present

## 2021-08-19 DIAGNOSIS — I10 Essential (primary) hypertension: Secondary | ICD-10-CM | POA: Diagnosis not present

## 2021-08-19 DIAGNOSIS — E1165 Type 2 diabetes mellitus with hyperglycemia: Secondary | ICD-10-CM | POA: Diagnosis not present

## 2021-08-19 DIAGNOSIS — E785 Hyperlipidemia, unspecified: Secondary | ICD-10-CM | POA: Diagnosis not present

## 2021-08-19 DIAGNOSIS — R69 Illness, unspecified: Secondary | ICD-10-CM | POA: Diagnosis not present

## 2021-08-19 DIAGNOSIS — F418 Other specified anxiety disorders: Secondary | ICD-10-CM

## 2021-08-23 DIAGNOSIS — E1121 Type 2 diabetes mellitus with diabetic nephropathy: Secondary | ICD-10-CM | POA: Diagnosis not present

## 2021-08-23 DIAGNOSIS — E1136 Type 2 diabetes mellitus with diabetic cataract: Secondary | ICD-10-CM | POA: Diagnosis not present

## 2021-08-23 DIAGNOSIS — I10 Essential (primary) hypertension: Secondary | ICD-10-CM | POA: Diagnosis not present

## 2021-08-23 DIAGNOSIS — M5441 Lumbago with sciatica, right side: Secondary | ICD-10-CM | POA: Diagnosis not present

## 2021-08-23 DIAGNOSIS — E7800 Pure hypercholesterolemia, unspecified: Secondary | ICD-10-CM | POA: Diagnosis not present

## 2021-08-23 DIAGNOSIS — E1142 Type 2 diabetes mellitus with diabetic polyneuropathy: Secondary | ICD-10-CM | POA: Diagnosis not present

## 2021-08-23 DIAGNOSIS — R69 Illness, unspecified: Secondary | ICD-10-CM | POA: Diagnosis not present

## 2021-08-23 DIAGNOSIS — I2510 Atherosclerotic heart disease of native coronary artery without angina pectoris: Secondary | ICD-10-CM | POA: Diagnosis not present

## 2021-08-23 DIAGNOSIS — J849 Interstitial pulmonary disease, unspecified: Secondary | ICD-10-CM | POA: Diagnosis not present

## 2021-08-23 DIAGNOSIS — G4733 Obstructive sleep apnea (adult) (pediatric): Secondary | ICD-10-CM | POA: Diagnosis not present

## 2021-08-24 ENCOUNTER — Encounter: Payer: Self-pay | Admitting: Family Medicine

## 2021-08-24 ENCOUNTER — Encounter: Payer: Self-pay | Admitting: Internal Medicine

## 2021-08-24 DIAGNOSIS — E1142 Type 2 diabetes mellitus with diabetic polyneuropathy: Secondary | ICD-10-CM | POA: Diagnosis not present

## 2021-08-24 DIAGNOSIS — R69 Illness, unspecified: Secondary | ICD-10-CM | POA: Diagnosis not present

## 2021-08-24 DIAGNOSIS — E1121 Type 2 diabetes mellitus with diabetic nephropathy: Secondary | ICD-10-CM | POA: Diagnosis not present

## 2021-08-24 DIAGNOSIS — J849 Interstitial pulmonary disease, unspecified: Secondary | ICD-10-CM | POA: Diagnosis not present

## 2021-08-24 DIAGNOSIS — I2510 Atherosclerotic heart disease of native coronary artery without angina pectoris: Secondary | ICD-10-CM | POA: Diagnosis not present

## 2021-08-24 DIAGNOSIS — M5441 Lumbago with sciatica, right side: Secondary | ICD-10-CM | POA: Diagnosis not present

## 2021-08-24 DIAGNOSIS — E1136 Type 2 diabetes mellitus with diabetic cataract: Secondary | ICD-10-CM | POA: Diagnosis not present

## 2021-08-24 DIAGNOSIS — E7800 Pure hypercholesterolemia, unspecified: Secondary | ICD-10-CM | POA: Diagnosis not present

## 2021-08-24 DIAGNOSIS — G4733 Obstructive sleep apnea (adult) (pediatric): Secondary | ICD-10-CM | POA: Diagnosis not present

## 2021-08-24 DIAGNOSIS — I10 Essential (primary) hypertension: Secondary | ICD-10-CM | POA: Diagnosis not present

## 2021-08-24 MED ORDER — INSULIN PEN NEEDLE 31G X 5 MM MISC: 1.0000 | Freq: Four times a day (QID) | 3 refills | Status: AC

## 2021-08-24 MED ORDER — NOVOLOG FLEXPEN 100 UNIT/ML ~~LOC~~ SOPN: PEN_INJECTOR | SUBCUTANEOUS | 6 refills | Status: AC

## 2021-08-24 MED ORDER — AMLODIPINE BESYLATE 10 MG PO TABS: 10.0000 mg | ORAL_TABLET | Freq: Every day | ORAL | 1 refills | Status: DC

## 2021-08-24 MED ORDER — TRESIBA FLEXTOUCH 100 UNIT/ML ~~LOC~~ SOPN: 30.0000 [IU] | PEN_INJECTOR | Freq: Every day | SUBCUTANEOUS | 3 refills | Status: AC

## 2021-08-25 DIAGNOSIS — E1165 Type 2 diabetes mellitus with hyperglycemia: Secondary | ICD-10-CM | POA: Diagnosis not present

## 2021-08-29 ENCOUNTER — Ambulatory Visit (INDEPENDENT_AMBULATORY_CARE_PROVIDER_SITE_OTHER): Payer: Medicare HMO | Admitting: *Deleted

## 2021-08-29 DIAGNOSIS — I10 Essential (primary) hypertension: Secondary | ICD-10-CM | POA: Diagnosis not present

## 2021-08-29 DIAGNOSIS — F1520 Other stimulant dependence, uncomplicated: Secondary | ICD-10-CM

## 2021-08-29 DIAGNOSIS — G4733 Obstructive sleep apnea (adult) (pediatric): Secondary | ICD-10-CM | POA: Diagnosis not present

## 2021-08-29 DIAGNOSIS — R0609 Other forms of dyspnea: Secondary | ICD-10-CM

## 2021-08-29 DIAGNOSIS — E785 Hyperlipidemia, unspecified: Secondary | ICD-10-CM

## 2021-08-29 DIAGNOSIS — F410 Panic disorder [episodic paroxysmal anxiety] without agoraphobia: Secondary | ICD-10-CM

## 2021-08-29 DIAGNOSIS — E7800 Pure hypercholesterolemia, unspecified: Secondary | ICD-10-CM | POA: Diagnosis not present

## 2021-08-29 DIAGNOSIS — E1121 Type 2 diabetes mellitus with diabetic nephropathy: Secondary | ICD-10-CM | POA: Diagnosis not present

## 2021-08-29 DIAGNOSIS — M5441 Lumbago with sciatica, right side: Secondary | ICD-10-CM | POA: Diagnosis not present

## 2021-08-29 DIAGNOSIS — R2689 Other abnormalities of gait and mobility: Secondary | ICD-10-CM

## 2021-08-29 DIAGNOSIS — R69 Illness, unspecified: Secondary | ICD-10-CM | POA: Diagnosis not present

## 2021-08-29 DIAGNOSIS — E0842 Diabetes mellitus due to underlying condition with diabetic polyneuropathy: Secondary | ICD-10-CM

## 2021-08-29 DIAGNOSIS — E1142 Type 2 diabetes mellitus with diabetic polyneuropathy: Secondary | ICD-10-CM | POA: Diagnosis not present

## 2021-08-29 DIAGNOSIS — J849 Interstitial pulmonary disease, unspecified: Secondary | ICD-10-CM | POA: Diagnosis not present

## 2021-08-29 DIAGNOSIS — F32A Depression, unspecified: Secondary | ICD-10-CM

## 2021-08-29 DIAGNOSIS — G6181 Chronic inflammatory demyelinating polyneuritis: Secondary | ICD-10-CM

## 2021-08-29 DIAGNOSIS — F418 Other specified anxiety disorders: Secondary | ICD-10-CM

## 2021-08-29 DIAGNOSIS — R45851 Suicidal ideations: Secondary | ICD-10-CM

## 2021-08-29 DIAGNOSIS — E1136 Type 2 diabetes mellitus with diabetic cataract: Secondary | ICD-10-CM | POA: Diagnosis not present

## 2021-08-29 DIAGNOSIS — Z794 Long term (current) use of insulin: Secondary | ICD-10-CM

## 2021-08-29 DIAGNOSIS — L03314 Cellulitis of groin: Secondary | ICD-10-CM

## 2021-08-29 DIAGNOSIS — I2510 Atherosclerotic heart disease of native coronary artery without angina pectoris: Secondary | ICD-10-CM | POA: Diagnosis not present

## 2021-08-29 NOTE — Chronic Care Management (AMB) (Signed)
Chronic Care Management    Clinical Social Work Note  08/29/2021 Name: Tyler Brewer MRN: [id number] DOB: 1961-07-30  Tyler Brewer is a 61 y.o. year old male who is a primary care patient of [name], DO. The CCM team was consulted to assist the patient with chronic disease management and/or care coordination needs related to: [employer], [employer], Diabetic Footwear, and Museum/gallery conservator.   Engaged with patient by telephone for follow up visit in response to provider referral for social work chronic care management and care coordination services.   Consent to Services:  The patient was given information about Chronic Care Management services, agreed to services, and gave verbal consent prior to initiation of services.  Please see initial visit note for detailed documentation.   Patient agreed to services and consent obtained.   Assessment: Review of patient past medical history, allergies, medications, and health status, including review of relevant consultants reports was performed today as part of a comprehensive evaluation and provision of chronic care management and care coordination services.     SDOH (Social Determinants of Health) assessments and interventions performed:    Advanced Directives Status: Not addressed in this encounter.  CCM Care Plan  No Known Allergies  Outpatient Encounter Medications as of 08/29/2021  Medication Sig   acetaminophen (TYLENOL) 500 MG tablet Take 500-1,000 mg by mouth every 6 (six) hours as needed for mild pain.   AMBULATORY NON FORMULARY MEDICATION Medication Name: Audelia Hives Wheelchair   amLODipine (NORVASC) 10 MG tablet Take 1 tablet (10 mg total) by mouth daily.   COMFORT EZ INSULIN SYRINGE 31G X 5/16" 1 ML MISC    Cyanocobalamin (VITAMIN B-12 PO) Take 1 tablet by mouth daily.   diclofenac Sodium (VOLTAREN) 1 % GEL Apply 4 g topically 4 (four) times daily as needed (joint pain).   DULoxetine  (CYMBALTA) 60 MG capsule Take 1 capsule by mouth daily.   furosemide (LASIX) 20 MG tablet Take 2 tablets (40 mg total) by mouth daily.   hydrALAZINE (APRESOLINE) 50 MG tablet TAKE 1 TABLET(50 MG) BY MOUTH EVERY 8 HOURS   hydrOXYzine (ATARAX) 25 MG tablet Take 1 tablet (25 mg total) by mouth every 6 (six) hours as needed for anxiety.   ibuprofen (ADVIL) 200 MG tablet Take 400 mg by mouth every 6 (six) hours as needed for mild pain.   immune globulin, human, (GAMMAGARD S/D) 5 g injection Inject into the vein every 21 ( twenty-one) days.   insulin aspart (NOVOLOG FLEXPEN) 100 UNIT/ML FlexPen Max daily 80 units per scale   insulin degludec (TRESIBA FLEXTOUCH) 100 UNIT/ML FlexTouch Pen Inject 30 Units into the skin daily.   Insulin Pen Needle 31G X 5 MM MISC 1 Device by Does not apply route in the morning, at noon, in the evening, and at bedtime.   losartan (COZAAR) 100 MG tablet TAKE ONE TABLET BY MOUTH DAILY   MAGNESIUM PO Take 1 tablet by mouth daily.   meclizine (ANTIVERT) 25 MG tablet Take 1 tablet (25 mg total) by mouth 3 (three) times daily as needed for dizziness.   meloxicam (MOBIC) 15 MG tablet Take 1 tablet (15 mg total) by mouth daily.   Menthol, Topical Analgesic, (ICY HOT EX) Apply 1 application topically as needed (pain).   metFORMIN (GLUCOPHAGE-XR) 500 MG 24 hr tablet Take 2 tablets (1,000 mg total) by mouth in the morning and at bedtime.   methocarbamol (ROBAXIN) 500 MG tablet Take 500 mg by mouth 4 (four) times  daily as needed for muscle spasms.   Multiple Vitamin (MULTIVITAMIN) tablet Take 1 tablet by mouth daily.   NONFORMULARY OR COMPOUNDED ITEM Power wheelchair   NONFORMULARY OR COMPOUNDED ITEM Manuel wheelchair   polyethylene glycol (MIRALAX / GLYCOLAX) 17 g packet Take 17 g by mouth daily as needed for mild constipation.   pravastatin (PRAVACHOL) 40 MG tablet Take 1 tablet (40 mg total) by mouth daily.   sildenafil (VIAGRA) 100 MG tablet Take 0.5-1 tablets (50-100 mg total) by  mouth daily as needed for erectile dysfunction.   tamsulosin (FLOMAX) 0.4 MG CAPS capsule TAKE ONE CAPSULE BY MOUTH ONE TIME DAILY   traMADol (ULTRAM) 50 MG tablet 2 po q6 h prn (Patient taking differently: Take 100 mg by mouth every 6 (six) hours as needed. 2 po q6 h prn)   No facility-administered encounter medications on file as of 08/29/2021.    Patient Active Problem List   Diagnosis Date Noted   CIDP (chronic inflammatory demyelinating polyneuropathy) ([employer]) 01/12/2021   Diabetes mellitus ([place]) 01/03/2021   Type 2 diabetes mellitus with hyperglycemia, with long-term current use of insulin ([place]) 01/03/2021   Lower extremity edema 11/28/2020   Chronic low back pain 11/28/2020   Psoriatic arthritis ([place]) 11/28/2020   Thoracic aortic aneurysm without rupture 11/28/2020   Bilateral leg weakness 11/05/2020   Acute left-sided low back pain with right-sided sciatica 07/19/2020   Hip pain, acute, left 07/05/2020   Tremor 07/05/2020   Weakness 07/05/2020   Balance problem 03/11/2020   Tinea cruris 03/11/2020   Cellulitis of left groin 03/11/2020   Acute pain of right shoulder 03/11/2020   Interstitial lung disease ([place]) 05/02/2017   Pansinusitis 09/26/2016   Diabetic polyneuropathy associated with diabetes mellitus due to underlying condition ([place]) 05/08/2016   Methamphetamine use disorder, severe, dependence ([place]) 02/11/2016   Substance induced mood disorder ([place]) 02/11/2016   Exertional dyspnea 11/30/2015   ADD (attention deficit disorder) 09/29/2015   Binge eating 09/29/2015   Syncope 05/05/2012   Diarrhea 05/05/2012   Panic attacks 05/05/2012   OSA (obstructive sleep apnea) 05/05/2012   HTN (hypertension) 05/05/2012   Dyslipidemia 05/05/2012   CAD (coronary artery disease)  cardiac cath with moderate disease in a septal branch of the ramus intermedius 04/01/2012   Chest pain 04/01/2012   Drug abuse and dependence ([place]) 04/01/2012   Family history of coronary artery disease  03/31/2012   Sleep apnea, on C-pap 03/31/2012   Hyperlipemia 03/30/2012   HTN (hypertension), benign 03/30/2012   Morbid obesity ([place]) 03/30/2012   DM type 2, uncontrolled, with neuropathy ([place]) 03/30/2012   Depression with suicidal ideation 03/30/2012    Conditions to be addressed/monitored: HTN and DMII.  Limited Social Support, Level of Care Concerns, ADL/IADL Limitations, Limited Access to Caregiver, Film/video editor, and Lacks Knowledge of [employer].  Care Plan : LCSW Plan of Care  Updates made by Francis Gaines, LCSW since 08/29/2021 12:00 AM     Problem: Improve Quality of Life through [place] and Durable Medical Equipment.   Priority: High     Long-Range Goal: Improve Quality of Life through [place] and Durable Medical Equipment.   Start Date: 03/30/2021  Expected End Date: 11/28/2021  This Visit's Progress: On track  Recent Progress: On track  Priority: High  Note:   Current Barriers:   Patient with History of Falls/Fall Risk, Unsteady Gait, Impaired Mobility, Weakness, Tremor, Syncope, Balance Problem and Lower Extremity Edema needs assistance with initiating short-term rehabilitative services in  a skilled nursing facility or long-term care in an assisted living facility. Clinical Goal(s):  Patient will receive assistance with initiating placement, whether it be assisted living, skilled level care, or a handicapped accessible apartment.   Interventions: Collaboration with Primary Care Physician, Dr. [name] regarding development and update of comprehensive plan of care as evidenced by provider attestation and co-signature. Collaboration with Primary Care Physician, Dr. [name] to report painful swelling in bilateral legs and feet, per patient's request. Collaboration with Primary Care Physician, Dr. [name] to request order for manual wheelchair be faxed to [place], providing fax #  [phone]. Emailed patient ([email or web address]) a list of community agencies and resources that may be able to assist with payment of diabetic footwear. Patient Goals/Self-Care Activities:  Continue to receive monthly telephone outreach calls from LCSW, in an effort to ensure that you have home health services and durable medical equipment necessary to live independently in your home. LCSW collaboration with Dennis Bast from [facility] [phone]), to confirm eligibility for [place] and Physical Therapy Services, which you admitted to currently receiving.    ~No longer eligible for Home Health Occupational Therapy, Speech Therapy, and Social Work Services. Continue to receive Infusion Therapy for treatment of Chronic Inflammatory Demyelinating Polyneuropathy, through Dr. Deniece Portela, Neurologist with [facility]. LCSW collaboration with Primary Care Physician, Dr. [name] to request order for manual wheelchair be faxed to [place] (# [phone]).   ~Secure Chat and In [employer] sent to Primary Care Physician, Dr. [name] to request order for manual wheelchair be faxed to [place], providing fax # [phone]. LCSW collaboration with representative from [employer] [phone]) to confirm delivery date of power wheelchair.  ~Representative with [employer] indicated that your power wheelchair has been shipped and that it is due to arrive at your home on 09/03/2021. Begin contacting list of community agencies and resources that offer financial assistance with diabetic footwear, emailed to you by LCSW on 08/29/2021. Please contact LCSW directly (# [phone]) if you have questions, need assistance, or if additional social work needs are identified between now and our next scheduled telephone outreach call. Follow-Up:  09/15/2021 at 9:45 am    [place] Clinical Social Worker [place] [phone]

## 2021-08-29 NOTE — Patient Instructions (Signed)
Visit Information  Thank you for taking time to visit with me today. Please don't hesitate to contact me if I can be of assistance to you before our next scheduled telephone appointment.  Following are the goals we discussed today:  Patient Goals/Self-Care Activities:  Continue to receive monthly telephone outreach calls from LCSW, in an effort to ensure that you have home health services and durable medical equipment necessary to live independently in your home. LCSW collaboration with Dennis Bast from [facility] [phone]), to confirm eligibility for [place] and Physical Therapy Services, which you admitted to currently receiving.    ~No longer eligible for Home Health Occupational Therapy, Speech Therapy, and Social Work Services. Continue to receive Infusion Therapy for treatment of Chronic Inflammatory Demyelinating Polyneuropathy, through Dr. Deniece Portela, Neurologist with [facility]. LCSW collaboration with Primary Care Physician, Dr. [name] to request order for manual wheelchair be faxed to [place] (# [phone]).   ~Secure Chat and In [employer] sent to Primary Care Physician, Dr. [name] to request order for manual wheelchair be faxed to [place], providing fax # [phone]. LCSW collaboration with representative from [employer] [phone]) to confirm delivery date of power wheelchair.  ~Representative with [employer] indicated that your power wheelchair has been shipped and that it is due to arrive at your home on 09/03/2021. Begin contacting list of community agencies and resources that offer financial assistance with diabetic footwear, emailed to you by LCSW on 08/29/2021. Please contact LCSW directly (# [phone]) if you have questions, need assistance, or if additional social work needs are identified between now and our  next scheduled telephone outreach call. Follow-Up:  09/15/2021 at 9:45 am  Please call the care guide team at [phone] if you need to cancel or reschedule your appointment.   If you are experiencing a Mental Health or [place] or need someone to talk to, please call the Suicide and Crisis Lifeline: 988 call the [place] National Suicide Prevention Lifeline: 781 028 0997 or TTY: (786)229-4766 TTY 970-813-7449) to talk to a trained counselor call 1-800-273-TALK (toll free, 24 hour hotline) go to [facility] [address] [place] [id number]) call the [place]: [phone] call 911   Patient verbalizes understanding of instructions provided today and agrees to view in [place].   [place] Licensed Clinical Social Worker [place] [phone]

## 2021-08-30 ENCOUNTER — Emergency Department (HOSPITAL_BASED_OUTPATIENT_CLINIC_OR_DEPARTMENT_OTHER): Payer: Medicare HMO

## 2021-08-30 ENCOUNTER — Emergency Department (HOSPITAL_BASED_OUTPATIENT_CLINIC_OR_DEPARTMENT_OTHER)
Admission: EM | Admit: 2021-08-30 | Discharge: 2021-08-30 | Disposition: A | Discharge status: Home / Self Care | Payer: Medicare HMO | Attending: Emergency Medicine | Admitting: Emergency Medicine

## 2021-08-30 ENCOUNTER — Encounter (HOSPITAL_BASED_OUTPATIENT_CLINIC_OR_DEPARTMENT_OTHER): Payer: Self-pay

## 2021-08-30 DIAGNOSIS — R1084 Generalized abdominal pain: Secondary | ICD-10-CM | POA: Diagnosis not present

## 2021-08-30 DIAGNOSIS — R109 Unspecified abdominal pain: Secondary | ICD-10-CM | POA: Insufficient documentation

## 2021-08-30 DIAGNOSIS — R600 Localized edema: Secondary | ICD-10-CM | POA: Diagnosis not present

## 2021-08-30 DIAGNOSIS — Z743 Need for continuous supervision: Secondary | ICD-10-CM | POA: Diagnosis not present

## 2021-08-30 DIAGNOSIS — I10 Essential (primary) hypertension: Secondary | ICD-10-CM | POA: Diagnosis not present

## 2021-08-30 DIAGNOSIS — N200 Calculus of kidney: Secondary | ICD-10-CM | POA: Diagnosis not present

## 2021-08-30 DIAGNOSIS — R5381 Other malaise: Secondary | ICD-10-CM | POA: Diagnosis not present

## 2021-08-30 LAB — COMPREHENSIVE METABOLIC PANEL
ALT: 27 U/L (ref 0–44)
AST: 20 U/L (ref 15–41)
Albumin: 3.8 g/dL (ref 3.5–5.0)
Alkaline Phosphatase: 72 U/L (ref 38–126)
Anion gap: 11 (ref 5–15)
BUN: 21 mg/dL — ABNORMAL HIGH (ref 6–20)
CO2: 22 mmol/L (ref 22–32)
Calcium: 9.2 mg/dL (ref 8.9–10.3)
Chloride: 101 mmol/L (ref 98–111)
Creatinine, Ser: 1 mg/dL (ref 0.61–1.24)
GFR, Estimated: >60 mL/min (ref >60)
Glucose, Bld: 230 mg/dL — ABNORMAL HIGH (ref 70–99)
Potassium: 4.1 mmol/L (ref 3.5–5.1)
Sodium: 134 mmol/L — ABNORMAL LOW (ref 135–145)
Total Bilirubin: 0.5 mg/dL (ref 0.3–1.2)
Total Protein: 7.8 g/dL (ref 6.5–8.1)

## 2021-08-30 LAB — CBC WITH DIFFERENTIAL/PLATELET
Abs Immature Granulocytes: 0.03 10*3/uL (ref 0.00–0.07)
Basophils Absolute: 0.1 10*3/uL (ref 0.0–0.1)
Basophils Relative: 1 %
Eosinophils Absolute: 0.3 10*3/uL (ref 0.0–0.5)
Eosinophils Relative: 4 %
HCT: 41.5 % (ref 39.0–52.0)
Hemoglobin: 13.8 g/dL (ref 13.0–17.0)
Immature Granulocytes: 0 %
Lymphocytes Relative: 23 %
Lymphs Abs: 1.7 10*3/uL (ref 0.7–4.0)
MCH: 28.9 pg (ref 26.0–34.0)
MCHC: 33.3 g/dL (ref 30.0–36.0)
MCV: 87 fL (ref 80.0–100.0)
Monocytes Absolute: 0.7 10*3/uL (ref 0.1–1.0)
Monocytes Relative: 9 %
Neutro Abs: 4.7 10*3/uL (ref 1.7–7.7)
Neutrophils Relative %: 63 %
Platelets: 327 10*3/uL (ref 150–400)
RBC: 4.77 MIL/uL (ref 4.22–5.81)
RDW: 13.2 % (ref 11.5–15.5)
WBC: 7.5 10*3/uL (ref 4.0–10.5)
nRBC: 0 % (ref 0.0–0.2)

## 2021-08-30 IMAGING — CT CT RENAL STONE PROTOCOL
2 of 4 series · 16 of 46 positions shown, 18 images · non-contrast
Comparison: [DATE]

CLINICAL DATA: Flank pain, kidney stone suspected. Left flank pain.



[Series 2: axial st · axial · 0.98mm/px · z∈[-558,-33]mm · 13 of 116 slices shown, 15 images]
[im 6/116  soft-tissue]
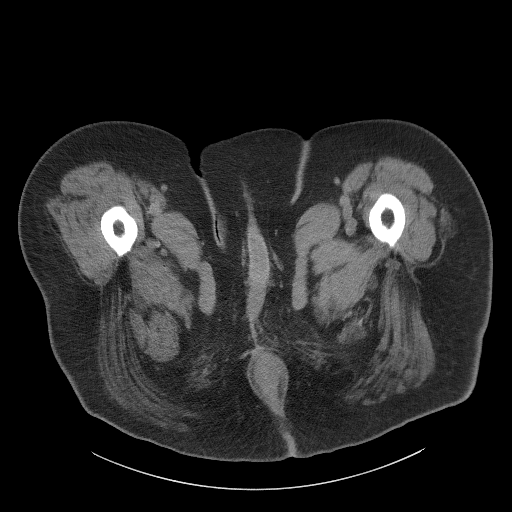
[im 6/116  bone]
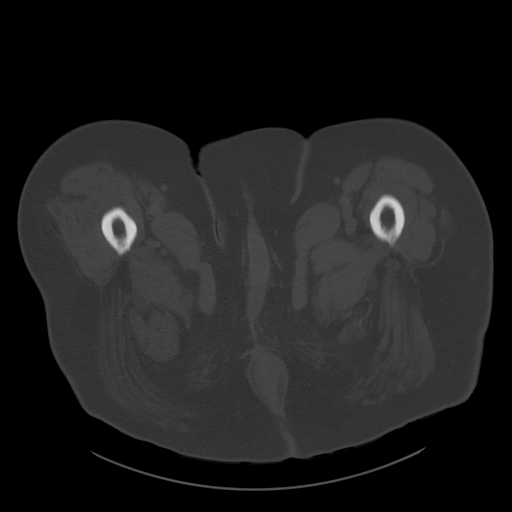
[im 16/116  soft-tissue]
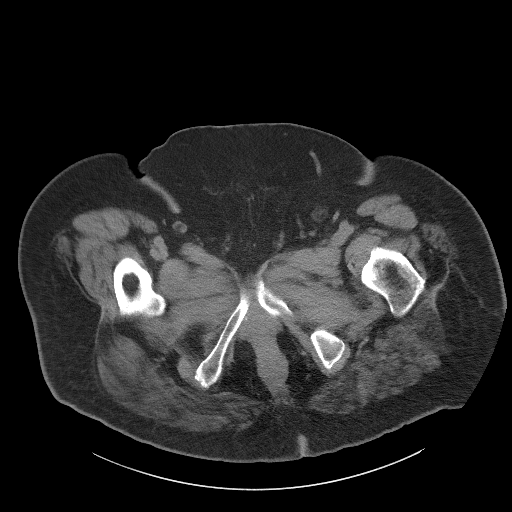
[im 26/116  soft-tissue]
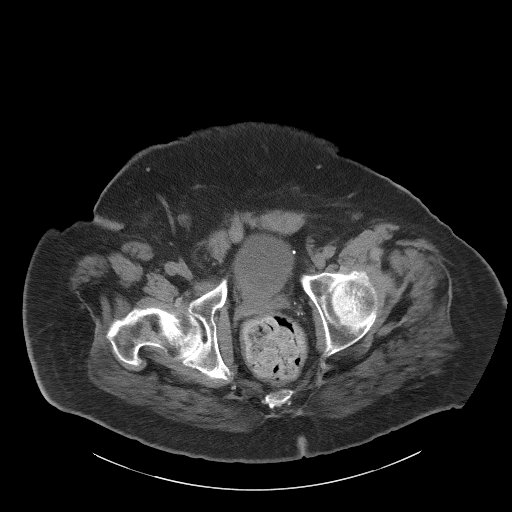
[im 31/116  soft-tissue]
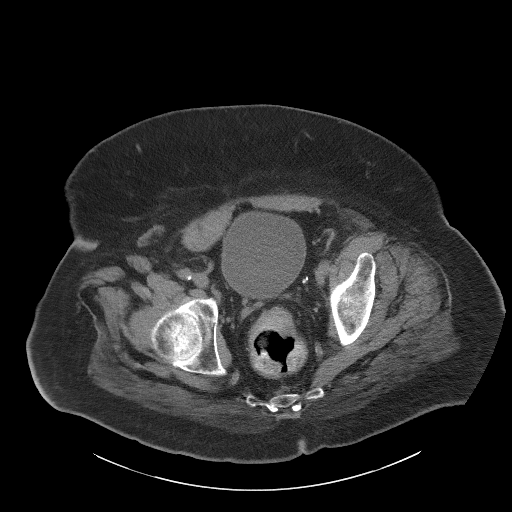
[im 41/116  soft-tissue]
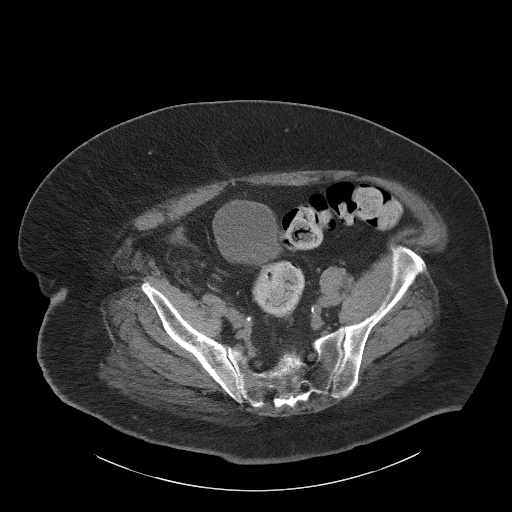
[im 51/116  soft-tissue]
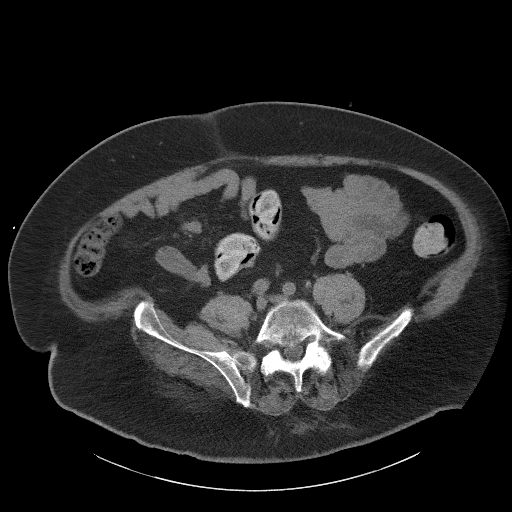
[im 61/116  soft-tissue]
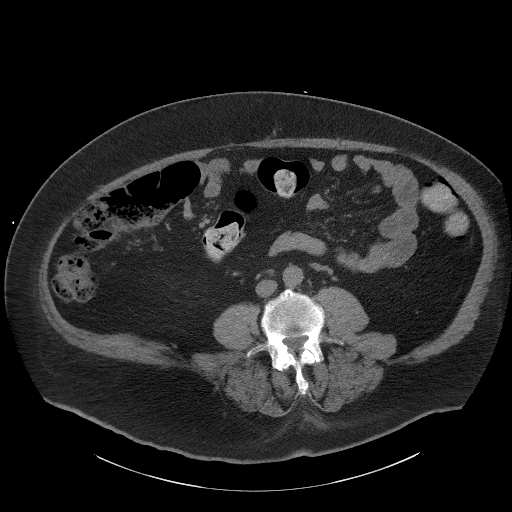
[im 66/116  soft-tissue]
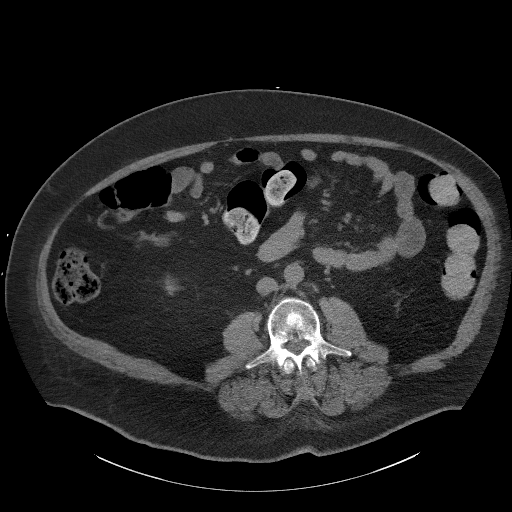
[im 76/116  soft-tissue]
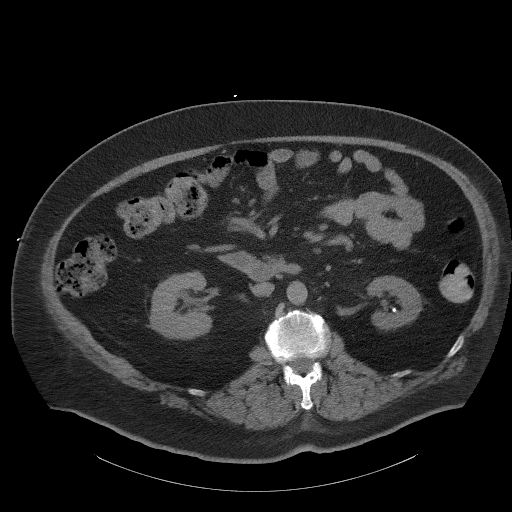
[im 76/116  bone]
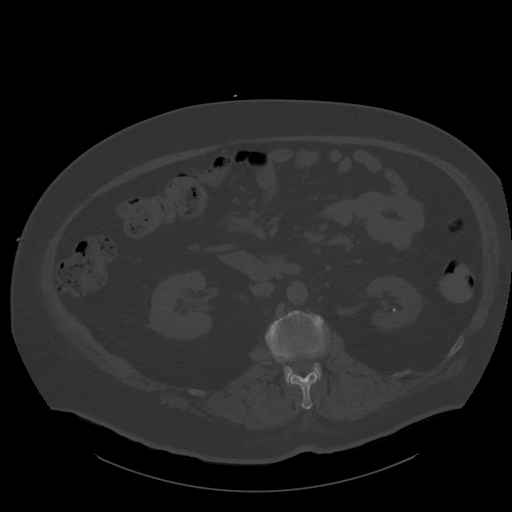
[im 86/116  soft-tissue]
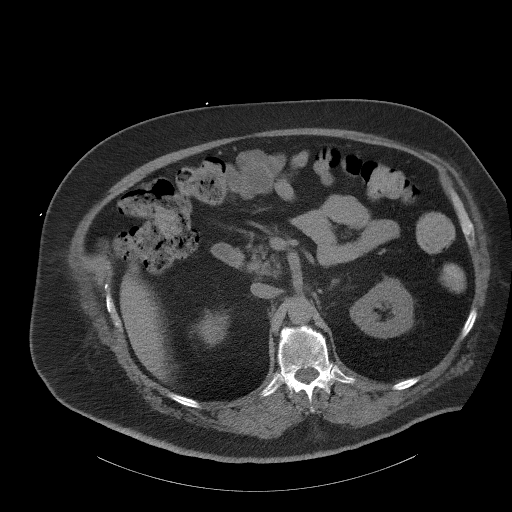
[im 91/116  soft-tissue]
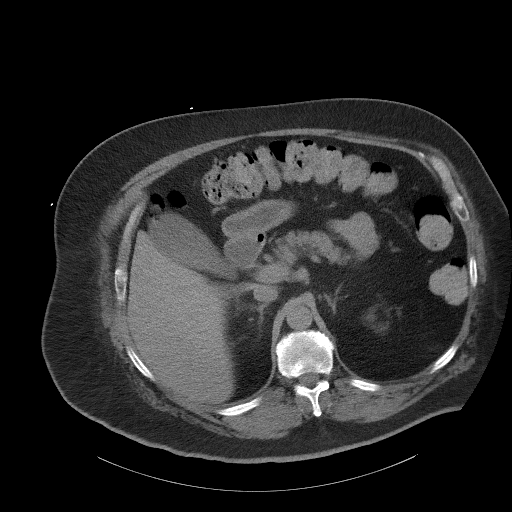
[im 101/116  soft-tissue]
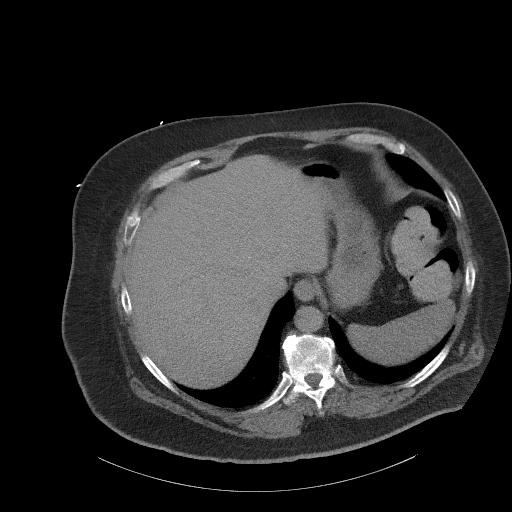
[im 111/116  soft-tissue]
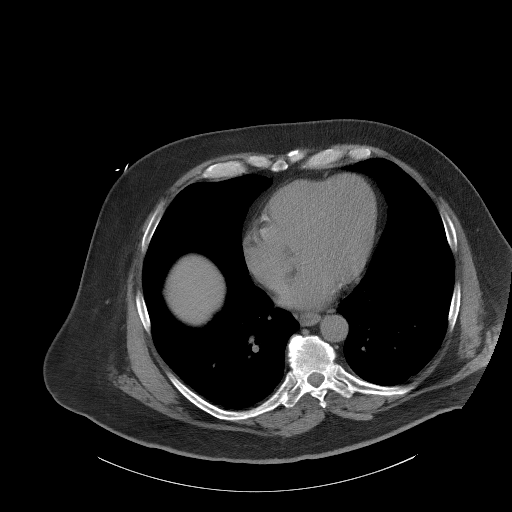

[Series 5: coronal st · coronal · 1.06mm/px · 3 of 127 slices shown]
[im 43/127  soft-tissue]
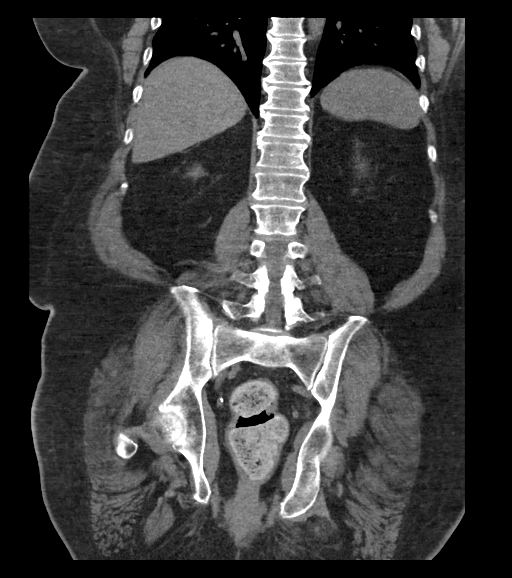
[im 57/127  soft-tissue]
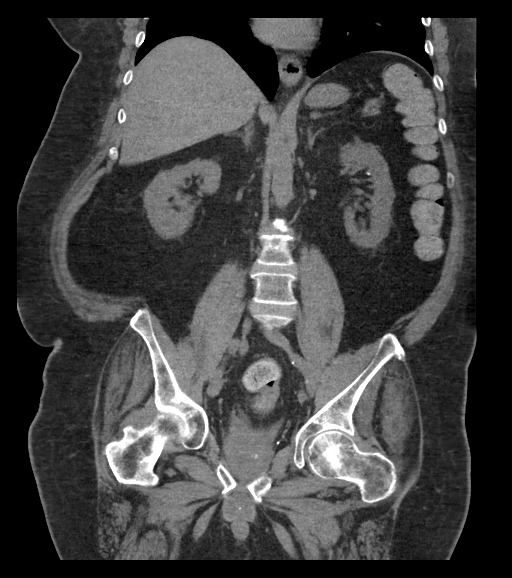
[im 71/127  soft-tissue]
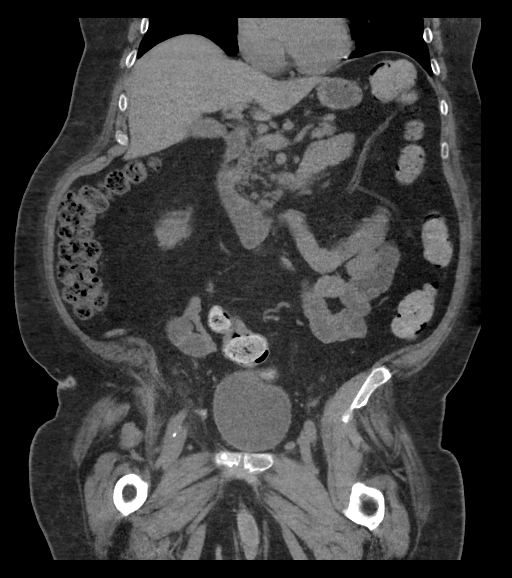

[16 of 46 positions shown; findings below may reference images not displayed]

FINDINGS: Lower chest: Lung bases are clear.

Hepatobiliary: Normal appearance of the liver and gallbladder.

Pancreas: Unremarkable. No pancreatic ductal dilatation or
surrounding inflammatory changes.

Spleen: Normal in size without focal abnormality.

Adrenals/Urinary Tract: Normal adrenal glands. 3 mm stone in the
left kidney upper pole. 4 mm stone in left kidney lower pole.
Negative for left hydronephrosis. Small exophytic structure in the
anterior left kidney probably represents a cyst based on the
Houn[place]field units. Normal appearance of the urinary bladder. Normal
appearance of the right kidney without stones or hydronephrosis.

Stomach/Bowel: Stool throughout the colon. No evidence for bowel
obstruction or focal bowel inflammation. Normal appearance of the
stomach.

Vascular/Lymphatic: No significant vascular findings are present. No
enlarged abdominal or pelvic lymph nodes.

Reproductive: Prostate is unremarkable.

Other: Small amount of presacral edema. No ascites. Negative for
free air.

Musculoskeletal: Stable sclerosis involving the L3 vertebral body.
No acute bone abnormality.
IMPRESSION: 1. Nonobstructive left renal calculi. Stones have minimally changed
since [DATE].
[DATE]. Large amount of stool in the colon.

## 2021-08-30 MED ORDER — ONDANSETRON HCL 4 MG/2ML IJ SOLN
4.0000 mg | Freq: Once | INTRAMUSCULAR | Status: DC
  Filled 2021-08-30: qty 2

## 2021-08-30 MED ORDER — HYDROMORPHONE HCL 1 MG/ML IJ SOLN
1.0000 mg | Freq: Once | INTRAMUSCULAR | Status: AC
  Administered 2021-08-30: 1 mg via INTRAVENOUS
  Filled 2021-08-30: qty 1

## 2021-08-30 NOTE — Discharge Instructions (Addendum)
Take your medications as directed

## 2021-08-30 NOTE — ED Triage Notes (Signed)
Pt BIB [facility] c/o left flank pain x 3 days with urinary frequency the past few weeks. States swelling in legs has worsened. States known kidney stones in left kidney.

## 2021-08-30 NOTE — ED Notes (Signed)
Informed pt of need for urine specimen, pt states he will try again. Pain is resolved after medicine

## 2021-08-30 NOTE — ED Notes (Signed)
D/c paperwork reviewed with pt, including f/u care. Pt with no questions or concerns at time of d/c. Assisted to wheelchair by NT and wheeled to ED exit, NAD.

## 2021-08-30 NOTE — ED Provider Notes (Signed)
[facility] EMERGENCY DEPARTMENT Provider Note   CSN: [id number] Arrival date & time: 08/30/21  0932     History  Chief Complaint  Patient presents with   Flank Pain    Tyler Brewer is a 61 y.o. male.  Pt complains of pain in his left flank area.  Pt reports he has also had frequent urination.  Pt has a history of kidney stones.  Pt has chronic pain and is taking tramadol but has not had any relief.    The history is provided by the patient and the spouse. No language interpreter was used.  Flank Pain This is a new problem. The current episode started yesterday. The problem occurs constantly. The problem has been gradually worsening. Pertinent negatives include no abdominal pain. Nothing aggravates the symptoms. Nothing relieves the symptoms.      Home Medications Prior to Admission medications   Medication Sig Start Date End Date Taking? Authorizing Provider  acetaminophen (TYLENOL) 500 MG tablet Take 500-1,000 mg by mouth every 6 (six) hours as needed for mild pain.    [provider]  AMBULATORY NON FORMULARY MEDICATION Medication Name: [facility] Wheelchair 07/31/21   Carollee Herter, Alferd Apa, DO  amLODipine (NORVASC) 10 MG tablet Take 1 tablet (10 mg total) by mouth daily. 08/24/21   [name], DO  COMFORT EZ INSULIN SYRINGE 31G X 5/16" 1 ML MISC  12/15/20   [provider]  Cyanocobalamin (VITAMIN B-12 PO) Take 1 tablet by mouth daily.    [provider]  diclofenac Sodium (VOLTAREN) 1 % GEL Apply 4 g topically 4 (four) times daily as needed (joint pain).    [provider]  DULoxetine (CYMBALTA) 60 MG capsule Take 1 capsule by mouth daily. 05/06/21   [provider]  furosemide (LASIX) 20 MG tablet Take 2 tablets (40 mg total) by mouth daily. 06/30/21   [name] R, DO  hydrALAZINE (APRESOLINE) 50 MG tablet TAKE 1 TABLET(50 MG) BY MOUTH EVERY 8 HOURS 08/01/21   Carollee Herter, Alferd Apa, DO  hydrOXYzine  (ATARAX) 25 MG tablet Take 1 tablet (25 mg total) by mouth every 6 (six) hours as needed for anxiety. 08/09/21   [name], DO  ibuprofen (ADVIL) 200 MG tablet Take 400 mg by mouth every 6 (six) hours as needed for mild pain.    [provider]  immune globulin, human, (GAMMAGARD S/D) 5 g injection Inject into the vein every 21 ( twenty-one) days.    [provider]  insulin aspart (NOVOLOG FLEXPEN) 100 UNIT/ML FlexPen Max daily 80 units per scale 08/24/21   [name], Melanie Crazier, MD  insulin degludec (TRESIBA FLEXTOUCH) 100 UNIT/ML FlexTouch Pen Inject 30 Units into the skin daily. 08/24/21   [name], Melanie Crazier, MD  Insulin Pen Needle 31G X 5 MM MISC 1 Device by Does not apply route in the morning, at noon, in the evening, and at bedtime. 08/24/21   [name], Melanie Crazier, MD  losartan (COZAAR) 100 MG tablet TAKE ONE TABLET BY MOUTH DAILY 04/18/21   Carollee Herter, Alferd Apa, DO  MAGNESIUM PO Take 1 tablet by mouth daily.    [provider]  meclizine (ANTIVERT) 25 MG tablet Take 1 tablet (25 mg total) by mouth 3 (three) times daily as needed for dizziness. 02/18/21   Molpus, John, MD  meloxicam (MOBIC) 15 MG tablet Take 1 tablet (15 mg total) by mouth daily. 03/14/21   Kirsteins, Luanna Salk, MD  Menthol, Topical  Analgesic, (ICY HOT EX) Apply 1 application topically as needed (pain).    [provider]  metFORMIN (GLUCOPHAGE-XR) 500 MG 24 hr tablet Take 2 tablets (1,000 mg total) by mouth in the morning and at bedtime. 01/26/21   [name], DO  methocarbamol (ROBAXIN) 500 MG tablet Take 500 mg by mouth 4 (four) times daily as needed for muscle spasms.    [provider]  Multiple Vitamin (MULTIVITAMIN) tablet Take 1 tablet by mouth daily.    [provider]  NONFORMULARY OR COMPOUNDED ITEM Power wheelchair 06/15/21   Carollee Herter, Alferd Apa, DO  NONFORMULARY OR COMPOUNDED [employer] wheelchair 08/04/21   Carollee Herter,  Alferd Apa, DO  polyethylene glycol (MIRALAX / GLYCOLAX) 17 g packet Take 17 g by mouth daily as needed for mild constipation. 07/21/21   Davonna Belling, MD  pravastatin (PRAVACHOL) 40 MG tablet Take 1 tablet (40 mg total) by mouth daily. 01/26/21   [name], DO  sildenafil (VIAGRA) 100 MG tablet Take 0.5-1 tablets (50-100 mg total) by mouth daily as needed for erectile dysfunction. 07/24/21   [name], DO  tamsulosin (FLOMAX) 0.4 MG CAPS capsule TAKE ONE CAPSULE BY MOUTH ONE TIME DAILY 04/18/21   Carollee Herter, Alferd Apa, DO  traMADol (ULTRAM) 50 MG tablet 2 po q6 h prn Patient taking differently: Take 100 mg by mouth every 6 (six) hours as needed. 2 po q6 h prn 06/30/21   [name], DO      Allergies    Patient has no known allergies.    Review of Systems   Review of Systems  Gastrointestinal:  Negative for abdominal pain.  Genitourinary:  Positive for flank pain.  All other systems reviewed and are negative.  Physical Exam Updated Vital Signs BP 118/67    Pulse 74    Temp 98 F (36.7 C) (Oral)    Resp 15    Ht 5\' 11"  (1.803 m)    Wt 136.1 kg    SpO2 100%    BMI 41.84 kg/m  Physical Exam Vitals and nursing note reviewed.  Constitutional:      Appearance: He is well-developed.  HENT:     Head: Normocephalic.     Right Ear: Tympanic membrane normal.     Left Ear: Tympanic membrane normal.     Nose: Nose normal.     Mouth/Throat:     Mouth: Mucous membranes are moist.  Eyes:     Pupils: Pupils are equal, round, and reactive to light.  Cardiovascular:     Rate and Rhythm: Normal rate and regular rhythm.  Pulmonary:     Effort: Pulmonary effort is normal.  Abdominal:     General: Abdomen is flat. There is no distension.  Musculoskeletal:        General: Swelling present. Normal range of motion.     Cervical back: Normal range of motion.     Right lower leg: Edema present.     Left lower leg: Edema present.  Skin:    General: Skin is warm.   Neurological:     General: No focal deficit present.     Mental Status: He is alert and oriented to person, place, and time.  Psychiatric:        Mood and Affect: Mood normal.    ED Results / Procedures / Treatments   Labs (all labs ordered are listed, but only abnormal results are displayed) Labs Reviewed  COMPREHENSIVE  METABOLIC PANEL - Abnormal; Notable for the following components:      Result Value   Sodium 134 (*)    Glucose, Bld 230 (*)    BUN 21 (*)    All other components within normal limits  CBC WITH DIFFERENTIAL/PLATELET  URINALYSIS, ROUTINE W REFLEX MICROSCOPIC    EKG None  Radiology CT Renal Stone Study  Result Date: 08/30/2021 CLINICAL DATA:  Flank pain, kidney stone suspected. Left flank pain. EXAM: CT ABDOMEN AND PELVIS WITHOUT CONTRAST TECHNIQUE: Multidetector CT imaging of the abdomen and pelvis was performed following the standard protocol without IV contrast. RADIATION DOSE REDUCTION: This exam was performed according to the departmental dose-optimization program which includes automated exposure control, adjustment of the mA and/or kV according to patient size and/or use of iterative reconstruction technique. COMPARISON:  05/15/2021 FINDINGS: Lower chest: Lung bases are clear. Hepatobiliary: Normal appearance of the liver and gallbladder. Pancreas: Unremarkable. No pancreatic ductal dilatation or surrounding inflammatory changes. Spleen: Normal in size without focal abnormality. Adrenals/Urinary Tract: Normal adrenal glands. 3 mm stone in the left kidney upper pole. 4 mm stone in left kidney lower pole. Negative for left hydronephrosis. Small exophytic structure in the anterior left kidney probably represents a cyst based on the Hounsfield units. Normal appearance of the urinary bladder. Normal appearance of the right kidney without stones or hydronephrosis. Stomach/Bowel: Stool throughout the colon. No evidence for bowel obstruction or focal bowel inflammation.  Normal appearance of the stomach. Vascular/Lymphatic: No significant vascular findings are present. No enlarged abdominal or pelvic lymph nodes. Reproductive: Prostate is unremarkable. Other: Small amount of presacral edema. No ascites. Negative for free air. Musculoskeletal: Stable sclerosis involving the L3 vertebral body. No acute bone abnormality. IMPRESSION: 1. Nonobstructive left renal calculi. Stones have minimally changed since 05/15/2021. 2. Large amount of stool in the colon. Electronically Signed   By: Markus Daft M.D.   On: 08/30/2021 15:05    Procedures Procedures    Medications Ordered in ED Medications  ondansetron (ZOFRAN) injection 4 mg (4 mg Intravenous Not Given 08/30/21 1112)  HYDROmorphone (DILAUDID) injection 1 mg (1 mg Intravenous Given 08/30/21 1109)    ED Course/ Medical Decision Making/ A&P  Pt unable to obtain urine.  Bladder scan  empty bladder no retention.  Ct renal scan obtained.  No obstruction, no hydronephrosis.  I suspect pt's discomfort is second to constipation.  Pt advised to take his fluid pill and elevate legs, Pt's labs reviewed, normal hemoglobin and hematocrit.  Pt has slight elevation of bun to 21,  Creatines is normal at 21.  I doubt urinary obstructive issue.   Pt reports after receiving IV pain and nausea medication his pain has completely resolved.                           Medical Decision Making Problems Addressed: Left flank pain: self-limited or minor problem    Details: possibility of kidney stone evaluated.  Amount and/or Complexity of Data Reviewed External Data Reviewed: notes.    Details: Notes from primary care and infusions Labs: ordered. Decision-making details documented in ED Course. Radiology: ordered and independent interpretation performed. Decision-making details documented in ED Course.  Risk Parenteral controlled substances.           Final Clinical Impression(s) / ED Diagnoses Final diagnoses:  Left flank pain     Rx / DC Orders ED Discharge Orders     None  Sidney Ace 08/30/21 1555    Truddie Hidden, MD 08/31/21 1324

## 2021-08-30 NOTE — ED Notes (Signed)
RN attempted to get urine sample from pt, pt reports "Im almost ready to give sample, give me 10 minutes."  Pt again encouraged to provide urine sample. Will continue to monitor.

## 2021-08-30 NOTE — ED Notes (Signed)
Bladder scan 65ml, no sizable volume noted.

## 2021-08-30 NOTE — ED Notes (Signed)
Ambulatory to restroom with slow steady gait with walker

## 2021-08-30 NOTE — ED Notes (Signed)
Pt attempted to urinate, states needs something to drink. Drink provided with PA [employer] approval

## 2021-08-30 NOTE — ED Notes (Signed)
Pt attempting to use urinal at this time.

## 2021-08-30 NOTE — ED Notes (Signed)
Unable to provide urine specimen at this time.  

## 2021-08-30 NOTE — ED Notes (Signed)
Pt transported to CT ?

## 2021-08-31 DIAGNOSIS — E1136 Type 2 diabetes mellitus with diabetic cataract: Secondary | ICD-10-CM | POA: Diagnosis not present

## 2021-08-31 DIAGNOSIS — G4733 Obstructive sleep apnea (adult) (pediatric): Secondary | ICD-10-CM | POA: Diagnosis not present

## 2021-08-31 DIAGNOSIS — E7800 Pure hypercholesterolemia, unspecified: Secondary | ICD-10-CM | POA: Diagnosis not present

## 2021-08-31 DIAGNOSIS — J849 Interstitial pulmonary disease, unspecified: Secondary | ICD-10-CM | POA: Diagnosis not present

## 2021-08-31 DIAGNOSIS — M5441 Lumbago with sciatica, right side: Secondary | ICD-10-CM | POA: Diagnosis not present

## 2021-08-31 DIAGNOSIS — I10 Essential (primary) hypertension: Secondary | ICD-10-CM | POA: Diagnosis not present

## 2021-08-31 DIAGNOSIS — R69 Illness, unspecified: Secondary | ICD-10-CM | POA: Diagnosis not present

## 2021-08-31 DIAGNOSIS — E1142 Type 2 diabetes mellitus with diabetic polyneuropathy: Secondary | ICD-10-CM | POA: Diagnosis not present

## 2021-08-31 DIAGNOSIS — I2510 Atherosclerotic heart disease of native coronary artery without angina pectoris: Secondary | ICD-10-CM | POA: Diagnosis not present

## 2021-08-31 DIAGNOSIS — E1121 Type 2 diabetes mellitus with diabetic nephropathy: Secondary | ICD-10-CM | POA: Diagnosis not present

## 2021-09-01 DIAGNOSIS — M79673 Pain in unspecified foot: Secondary | ICD-10-CM | POA: Diagnosis not present

## 2021-09-01 DIAGNOSIS — R229 Localized swelling, mass and lump, unspecified: Secondary | ICD-10-CM | POA: Diagnosis not present

## 2021-09-05 DIAGNOSIS — G6181 Chronic inflammatory demyelinating polyneuritis: Secondary | ICD-10-CM | POA: Diagnosis not present

## 2021-09-06 DIAGNOSIS — E1142 Type 2 diabetes mellitus with diabetic polyneuropathy: Secondary | ICD-10-CM | POA: Diagnosis not present

## 2021-09-06 DIAGNOSIS — R2681 Unsteadiness on feet: Secondary | ICD-10-CM | POA: Diagnosis not present

## 2021-09-06 DIAGNOSIS — M6281 Muscle weakness (generalized): Secondary | ICD-10-CM | POA: Diagnosis not present

## 2021-09-06 DIAGNOSIS — G6181 Chronic inflammatory demyelinating polyneuritis: Secondary | ICD-10-CM | POA: Diagnosis not present

## 2021-09-07 DIAGNOSIS — G4733 Obstructive sleep apnea (adult) (pediatric): Secondary | ICD-10-CM | POA: Diagnosis not present

## 2021-09-07 DIAGNOSIS — I10 Essential (primary) hypertension: Secondary | ICD-10-CM | POA: Diagnosis not present

## 2021-09-07 DIAGNOSIS — J849 Interstitial pulmonary disease, unspecified: Secondary | ICD-10-CM | POA: Diagnosis not present

## 2021-09-07 DIAGNOSIS — M5441 Lumbago with sciatica, right side: Secondary | ICD-10-CM | POA: Diagnosis not present

## 2021-09-07 DIAGNOSIS — E7800 Pure hypercholesterolemia, unspecified: Secondary | ICD-10-CM | POA: Diagnosis not present

## 2021-09-07 DIAGNOSIS — I2510 Atherosclerotic heart disease of native coronary artery without angina pectoris: Secondary | ICD-10-CM | POA: Diagnosis not present

## 2021-09-07 DIAGNOSIS — R69 Illness, unspecified: Secondary | ICD-10-CM | POA: Diagnosis not present

## 2021-09-07 DIAGNOSIS — E1121 Type 2 diabetes mellitus with diabetic nephropathy: Secondary | ICD-10-CM | POA: Diagnosis not present

## 2021-09-07 DIAGNOSIS — E1136 Type 2 diabetes mellitus with diabetic cataract: Secondary | ICD-10-CM | POA: Diagnosis not present

## 2021-09-07 DIAGNOSIS — E1142 Type 2 diabetes mellitus with diabetic polyneuropathy: Secondary | ICD-10-CM | POA: Diagnosis not present

## 2021-09-08 ENCOUNTER — Encounter: Payer: Self-pay | Admitting: Family Medicine

## 2021-09-08 ENCOUNTER — Other Ambulatory Visit: Payer: Self-pay

## 2021-09-08 ENCOUNTER — Ambulatory Visit (HOSPITAL_BASED_OUTPATIENT_CLINIC_OR_DEPARTMENT_OTHER)
Admission: RE | Admit: 2021-09-08 | Discharge: 2021-09-08 | Disposition: A | Discharge status: Home / Self Care | Payer: Medicare HMO | Source: Ambulatory Visit | Attending: Family Medicine | Admitting: Family Medicine

## 2021-09-08 ENCOUNTER — Telehealth: Payer: Self-pay | Admitting: Family Medicine

## 2021-09-08 ENCOUNTER — Ambulatory Visit (INDEPENDENT_AMBULATORY_CARE_PROVIDER_SITE_OTHER): Payer: Medicare HMO | Admitting: Family Medicine

## 2021-09-08 VITALS — BP 132/82 | HR 90 | Temp 98.4°F | Resp 22 | Ht 71.0 in

## 2021-09-08 DIAGNOSIS — G6181 Chronic inflammatory demyelinating polyneuritis: Secondary | ICD-10-CM | POA: Diagnosis not present

## 2021-09-08 DIAGNOSIS — R69 Illness, unspecified: Secondary | ICD-10-CM | POA: Diagnosis not present

## 2021-09-08 DIAGNOSIS — M545 Low back pain, unspecified: Secondary | ICD-10-CM

## 2021-09-08 DIAGNOSIS — L4050 Arthropathic psoriasis, unspecified: Secondary | ICD-10-CM | POA: Diagnosis not present

## 2021-09-08 DIAGNOSIS — R600 Localized edema: Secondary | ICD-10-CM

## 2021-09-08 DIAGNOSIS — Z794 Long term (current) use of insulin: Secondary | ICD-10-CM

## 2021-09-08 DIAGNOSIS — F1920 Other psychoactive substance dependence, uncomplicated: Secondary | ICD-10-CM

## 2021-09-08 DIAGNOSIS — E1165 Type 2 diabetes mellitus with hyperglycemia: Secondary | ICD-10-CM | POA: Diagnosis not present

## 2021-09-08 DIAGNOSIS — R918 Other nonspecific abnormal finding of lung field: Secondary | ICD-10-CM | POA: Diagnosis not present

## 2021-09-08 DIAGNOSIS — J849 Interstitial pulmonary disease, unspecified: Secondary | ICD-10-CM

## 2021-09-08 DIAGNOSIS — E1159 Type 2 diabetes mellitus with other circulatory complications: Secondary | ICD-10-CM

## 2021-09-08 DIAGNOSIS — E785 Hyperlipidemia, unspecified: Secondary | ICD-10-CM

## 2021-09-08 DIAGNOSIS — I10 Essential (primary) hypertension: Secondary | ICD-10-CM

## 2021-09-08 DIAGNOSIS — G8929 Other chronic pain: Secondary | ICD-10-CM

## 2021-09-08 DIAGNOSIS — R0602 Shortness of breath: Secondary | ICD-10-CM

## 2021-09-08 LAB — TSH: TSH: 2.84 mIU/L (ref 0.40–4.50)

## 2021-09-08 IMAGING — DX DG CHEST 2V
2 series · 2 of 2 positions shown · non-contrast
Comparison: [DATE]

CLINICAL DATA: Bilateral lower extremity edema.

EXAM:
CHEST - 2 VIEW

[chest pa]
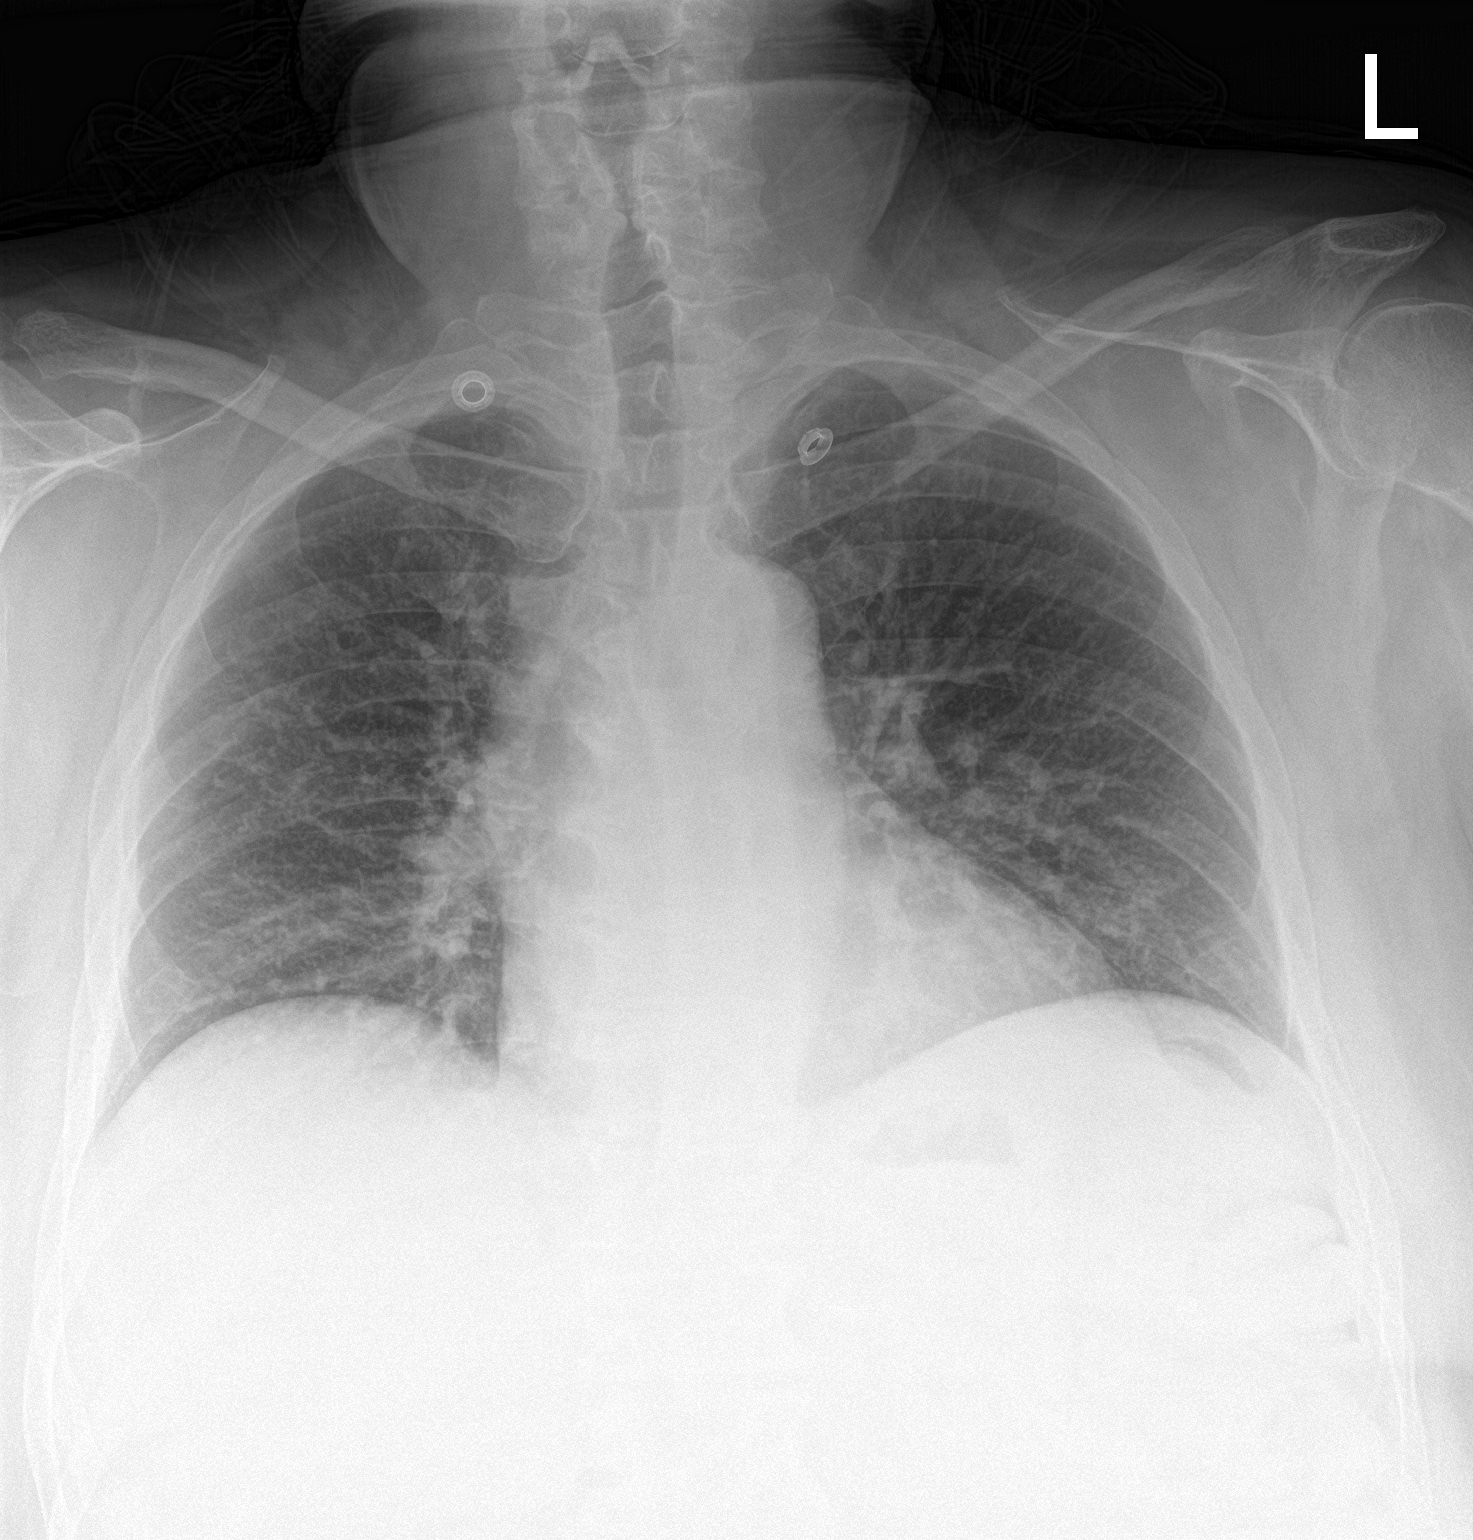

[chest lat]
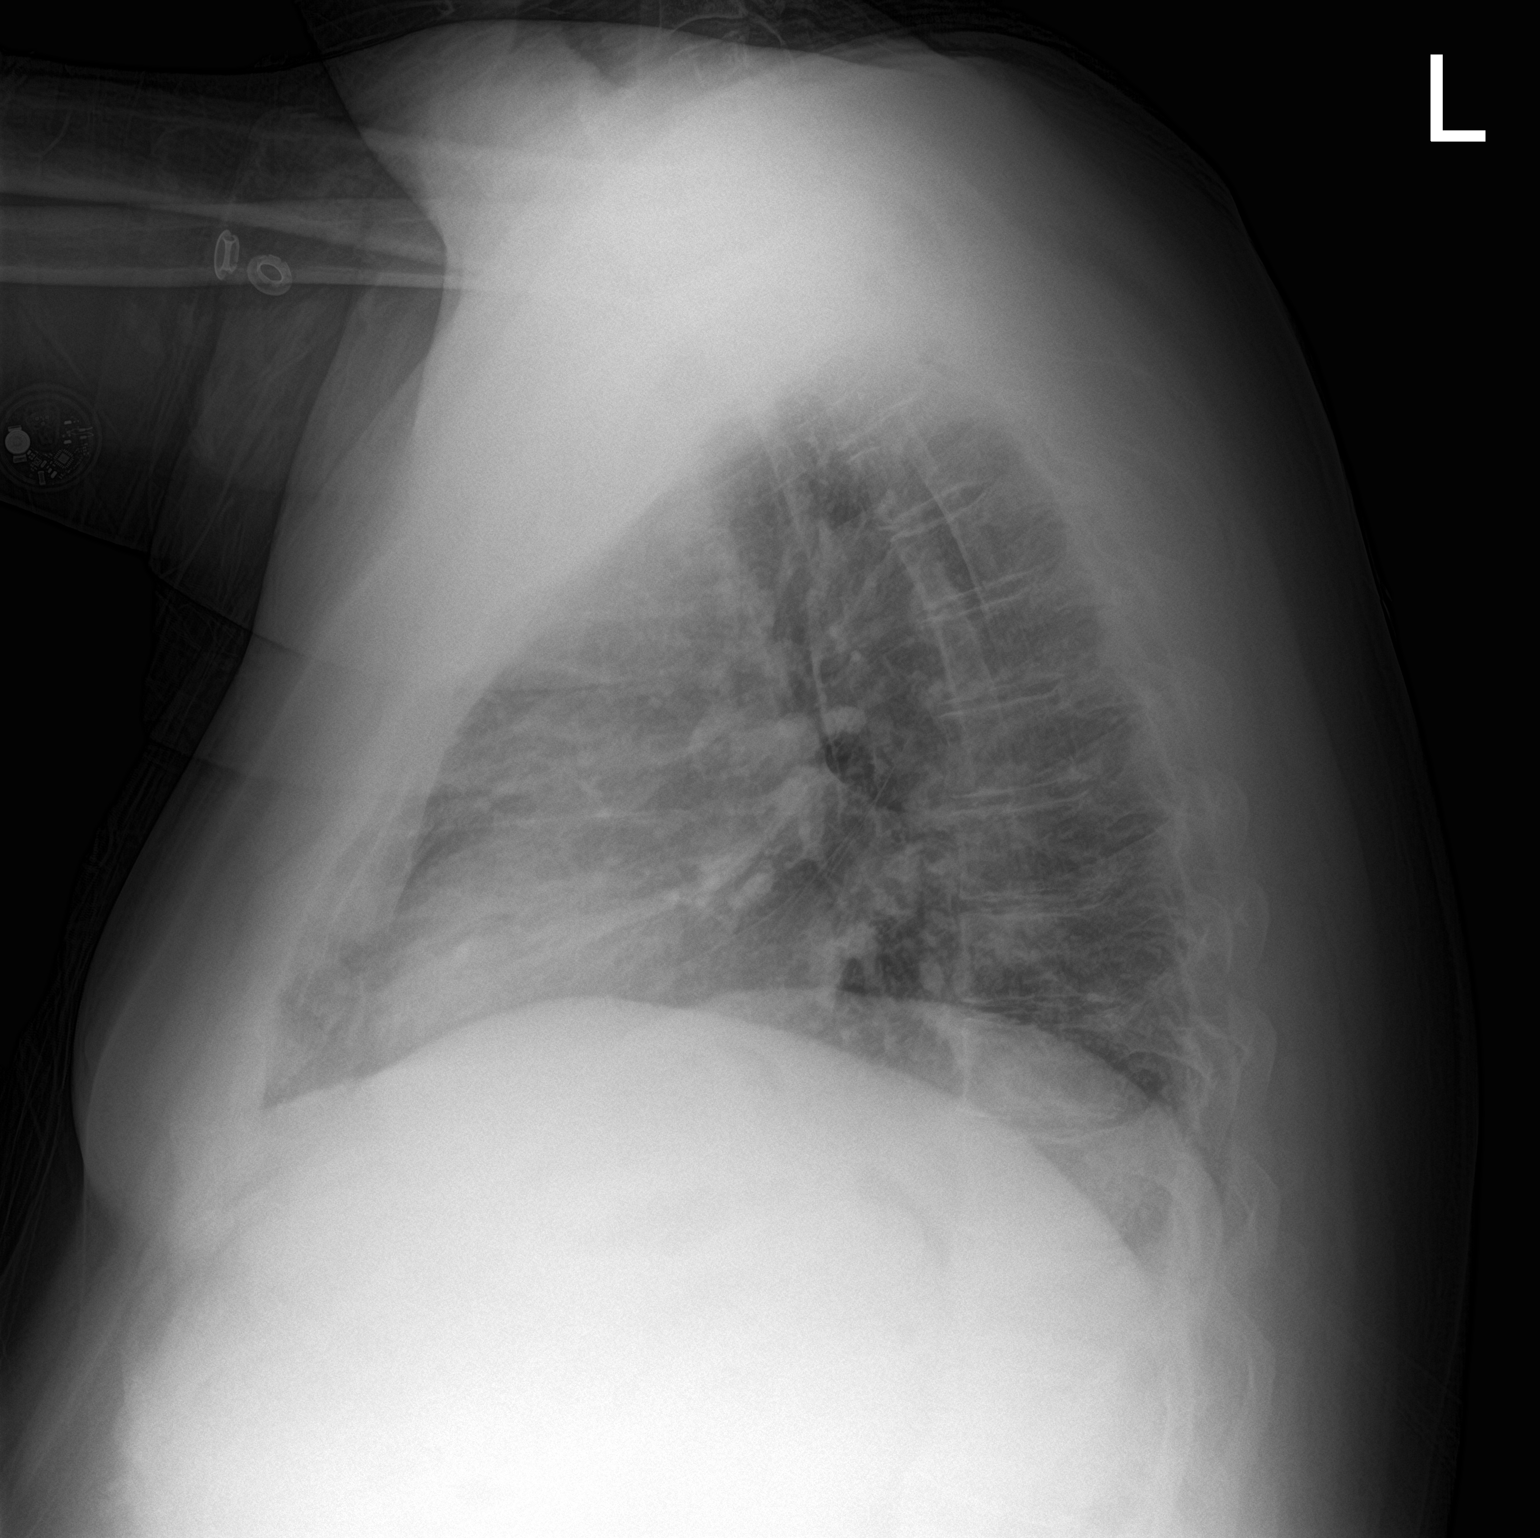

[2 of 2 positions shown; findings below may reference images not displayed]

FINDINGS: The heart size and mediastinal contours are within normal limits.
Low lung volumes are seen with bibasilar crowding of the
bronchovascular lung markings. There is no evidence of acute
infiltrate, pleural effusion or pneumothorax. Both lungs are clear.
The visualized skeletal structures are unremarkable.
IMPRESSION: Low lung volumes and bibasilar crowding without acute
cardiopulmonary disease.

## 2021-09-08 MED ORDER — TORSEMIDE 20 MG PO TABS: 20.0000 mg | ORAL_TABLET | Freq: Every day | ORAL | 1 refills | Status: DC

## 2021-09-08 MED ORDER — TRAMADOL HCL 50 MG PO TABS: ORAL_TABLET | ORAL | 1 refills | Status: DC

## 2021-09-08 NOTE — Assessment & Plan Note (Signed)
Elevate legs  D/c furosemide and start torsemide  F/u 2-3 weeks Check labs

## 2021-09-08 NOTE — Assessment & Plan Note (Signed)
Per endo °

## 2021-09-08 NOTE — Telephone Encounter (Signed)
Patient missed PT visit on 01/18, since he had infusion treatments that day. Physical therapy saw him yesterday and he had increased pain in feet and back.

## 2021-09-08 NOTE — Assessment & Plan Note (Signed)
Encourage heart healthy diet such as MIND or DASH diet, increase exercise, avoid trans fats, simple carbohydrates and processed foods, consider a krill or fish or flaxseed oil cap daily.  °

## 2021-09-08 NOTE — Telephone Encounter (Signed)
FYI

## 2021-09-08 NOTE — Assessment & Plan Note (Signed)
Well controlled, no changes to meds. Encouraged heart healthy diet such as the DASH diet and exercise as tolerated.  °

## 2021-09-08 NOTE — Patient Instructions (Signed)
Edema °Edema is an abnormal buildup of fluids in the body tissues and under the skin. Swelling of the legs, feet, and ankles is a common symptom that becomes more likely as you get older. Swelling is also common in looser tissues, like around the eyes. When the affected area is squeezed, the fluid may move out of that spot and leave a dent for a few moments. This dent is called pitting edema. °There are many possible causes of edema. Eating too much salt (sodium) and being on your feet or sitting for a long time can cause edema in your legs, feet, and ankles. Hot weather may make edema worse. Common causes of edema include: °Heart failure. °Liver or kidney disease. °Weak leg blood vessels. °Cancer. °An injury. °Pregnancy. °Medicines. °Being obese. °Low protein levels in the blood. °Edema is usually painless. Your skin may look swollen or shiny. °Follow these instructions at home: °Keep the affected body part raised (elevated) above the level of your heart when you are sitting or lying down. °Do not sit still or stand for long periods of time. °Do not wear tight clothing. Do not wear garters on your upper legs. °Exercise your legs to get your circulation going. This helps to move the fluid back into your blood vessels, and it may help the swelling go down. °Wear elastic bandages or support stockings to reduce swelling as told by your health care provider. °Eat a low-salt (low-sodium) diet to reduce fluid as told by your health care provider. °Depending on the cause of your swelling, you may need to limit how much fluid you drink (fluid restriction). °Take over-the-counter and prescription medicines only as told by your health care provider. °Contact a health care provider if: °Your edema does not get better with treatment. °You have heart, liver, or kidney disease and have symptoms of edema. °You have sudden and unexplained weight gain. °Get help right away if: °You develop shortness of breath or chest pain. °You  cannot breathe when you lie down. °You develop pain, redness, or warmth in the swollen areas. °You have heart, liver, or kidney disease and suddenly get edema. °You have a fever and your symptoms suddenly get worse. °Summary °Edema is an abnormal buildup of fluids in the body tissues and under the skin. °Eating too much salt (sodium) and being on your feet or sitting for a long time can cause edema in your legs, feet, and ankles. °Keep the affected body part raised (elevated) above the level of your heart when you are sitting or lying down. °This information is not intended to replace advice given to you by your health care provider. Make sure you discuss any questions you have with your health care provider. °Document Revised: 01/05/2021 Document Reviewed: 05/31/2020 °Elsevier Patient Education © 2022 [employer] ° °

## 2021-09-08 NOTE — Progress Notes (Addendum)
Subjective:   By signing my name below, I, Tyler Brewer, attest that this documentation has been prepared under the direction and in the presence of [name], DO. 09/08/2021     Patient ID: Tyler Brewer, male    DOB: [birth year], 61 y.o.   MRN: [id number]  Chief Complaint  Patient presents with   Edema   Follow-up    HPI Patient is in today for an office visit  He reports that the swelling in his feet have gotten worse. Accompanied by shortness of breath and pain in the left leg. He takes 20 mg lasix 2x daily. He notes that he is trying not to be sedentary at home, he moves around his house with his walker. Denies chest pain. Endorses occasional cough and sputum production.   He notes that after he receives his infusions, he cannot sleep properly for two days after and urinates frequently. He checks ads that his blood sugar levels have been a little high but is using the novolog flexpen.  From his ED visit on 08/30/2021, he is not constipated anymore and is using polyethylene glycol to clear his bowels.   Has an upcoming appointment with pain doctor in March 2023.  EKG showed artifacts and is unchanged from last time.  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[place])    Neuropathy    Obesity    Max weight was 390   OSA on CPAP    Panic attacks    Testosterone deficiency    Vertigo     Past Surgical History:  Procedure Laterality Date   CARDIAC CATHETERIZATION     IR FLUORO GUIDE CV LINE RIGHT  11/10/2020   IR US GUIDE VASC  ACCESS RIGHT  11/10/2020   LEFT HEART CATHETERIZATION WITH CORONARY ANGIOGRAM N/A 03/31/2012   Procedure: LEFT HEART CATHETERIZATION WITH CORONARY ANGIOGRAM;  Surgeon: Leonie Man, MD;  Location: [facility] C[facility] LAB;  Service: Cardiovascular;  Laterality: N/A;    Family History  Problem Relation Age of Onset   Diabetes type II Father    Hypertension Father    Pancreatic disease Father 42       Deceased   Healthy Mother    Healthy Sister    Healthy Son    Healthy Daughter    Parkinson's disease Maternal Grandmother     Social History   Socioeconomic History   Marital status: Single    Spouse name: Not on file   Number of children: 2   Years of education: Not on file   Highest education level: Not on file  Occupational History   Not on file  Tobacco Use   Smoking status: Former    Packs/day: 0.50    Types: Cigarettes    Quit date: 04/27/2021    Years since quitting: 0.3   Smokeless tobacco: Never  Vaping Use   Vaping Use: Never used  Substance and Sexual Activity   Alcohol use: No   Drug use: Not Currently    Types:  Methamphetamines   Sexual activity: Yes  Other Topics Concern   Not on file  Social History Narrative     Has 2 grown children.     Works as a Barrister's clerk.  Education: Oceanographer.   Right Handed   Drinks Caffeine    Mother recently moved and sold her house-- pt is living with a friend in a trailer    Social Determinants of Health   Financial Resource Strain: Medium Risk   Difficulty of Paying Living Expenses: Somewhat hard  Food Insecurity: No Food Insecurity   Worried About Charity fundraiser in the Last Year: Never true   Arboriculturist in the Last Year: Never true  Transportation Needs: Public librarian (Medical): Yes   Lack of Transportation (Non-Medical): Yes  Physical Activity: Insufficiently Active   Days of Exercise per Week: 2 days   Minutes of Exercise per Session: 30 min  Stress: Not on file  Social  Connections: Not on file  Intimate Partner Violence: Not on file    Outpatient Medications Prior to Visit  Medication Sig Dispense Refill   acetaminophen (TYLENOL) 500 MG tablet Take 500-1,000 mg by mouth every 6 (six) hours as needed for mild pain.     AMBULATORY NON FORMULARY MEDICATION Medication Name: Audelia Hives Wheelchair 1 Device 0   amLODipine (NORVASC) 10 MG tablet Take 1 tablet (10 mg total) by mouth daily. 90 tablet 1   COMFORT EZ INSULIN SYRINGE 31G X 5/16" 1 ML MISC      Cyanocobalamin (VITAMIN B-12 PO) Take 1 tablet by mouth daily.     diclofenac Sodium (VOLTAREN) 1 % GEL Apply 4 g topically 4 (four) times daily as needed (joint pain).     DULoxetine (CYMBALTA) 60 MG capsule Take 1 capsule by mouth daily.     hydrALAZINE (APRESOLINE) 50 MG tablet TAKE 1 TABLET(50 MG) BY MOUTH EVERY 8 HOURS 270 tablet 0   hydrOXYzine (ATARAX) 25 MG tablet Take 1 tablet (25 mg total) by mouth every 6 (six) hours as needed for anxiety. 30 tablet 0   ibuprofen (ADVIL) 200 MG tablet Take 400 mg by mouth every 6 (six) hours as needed for mild pain.     immune globulin, human, (GAMMAGARD S/D) 5 g injection Inject into the vein every 21 ( twenty-one) days.     insulin aspart (NOVOLOG FLEXPEN) 100 UNIT/ML FlexPen Max daily 80 units per scale 45 mL 6   insulin degludec (TRESIBA FLEXTOUCH) 100 UNIT/ML FlexTouch Pen Inject 30 Units into the skin daily. 30 mL 3   Insulin Pen Needle 31G X 5 MM MISC 1 Device by Does not apply route in the morning, at noon, in the evening, and at bedtime. 400 each 3   losartan (COZAAR) 100 MG tablet TAKE ONE TABLET BY MOUTH DAILY 90 tablet 1   MAGNESIUM PO Take 1 tablet by mouth daily.     meclizine (ANTIVERT) 25 MG tablet Take 1 tablet (25 mg total) by mouth 3 (three) times daily as needed for dizziness. 30 tablet 0   meloxicam (MOBIC) 15 MG tablet Take 1 tablet (15 mg total) by mouth daily. 30 tablet 1   Menthol, Topical Analgesic, (ICY HOT EX) Apply 1 application topically as  needed (pain).     metFORMIN (GLUCOPHAGE-XR) 500 MG 24 hr tablet Take 2 tablets (1,000 mg total) by mouth in the morning and at bedtime. 360 tablet 1   methocarbamol (ROBAXIN) 500 MG tablet Take  500 mg by mouth 4 (four) times daily as needed for muscle spasms.     Multiple Vitamin (MULTIVITAMIN) tablet Take 1 tablet by mouth daily.     NONFORMULARY OR COMPOUNDED ITEM Power wheelchair 1 each 0   NONFORMULARY OR COMPOUNDED ITEM Manuel wheelchair 1 each 0   polyethylene glycol (MIRALAX / GLYCOLAX) 17 g packet Take 17 g by mouth daily as needed for mild constipation. 14 each 0   pravastatin (PRAVACHOL) 40 MG tablet Take 1 tablet (40 mg total) by mouth daily. 90 tablet 1   sildenafil (VIAGRA) 100 MG tablet Take 0.5-1 tablets (50-100 mg total) by mouth daily as needed for erectile dysfunction. 10 tablet 11   tamsulosin (FLOMAX) 0.4 MG CAPS capsule TAKE ONE CAPSULE BY MOUTH ONE TIME DAILY 90 capsule 1   furosemide (LASIX) 20 MG tablet Take 2 tablets (40 mg total) by mouth daily. 180 tablet 1   traMADol (ULTRAM) 50 MG tablet 2 po q6 h prn (Patient taking differently: Take 100 mg by mouth every 6 (six) hours as needed. 2 po q6 h prn) 120 tablet 1   No facility-administered medications prior to visit.    No Known Allergies  Review of Systems  Constitutional:  Negative for fever.  HENT:  Negative for congestion, ear pain, hearing loss, sinus pain and sore throat.   Eyes:  Negative for blurred vision and pain.  Respiratory:  Positive for sputum production and shortness of breath. Negative for cough and wheezing.   Cardiovascular:  Positive for leg swelling. Negative for chest pain and palpitations.  Gastrointestinal:  Negative for blood in stool, constipation, diarrhea, nausea and vomiting.  Genitourinary:  Negative for dysuria, frequency, hematuria and urgency.  Musculoskeletal:  Negative for back pain, falls and myalgias.       (+) left leg pain   Neurological:  Negative for dizziness, sensory  change, loss of consciousness, weakness and headaches.  Endo/Heme/Allergies:  Negative for environmental allergies. Does not bruise/bleed easily.  Psychiatric/Behavioral:  Negative for depression and suicidal ideas. The patient is not nervous/anxious and does not have insomnia.       Objective:    Physical Exam Constitutional:      General: He is not in acute distress.    Appearance: Normal appearance. He is not ill-appearing.  HENT:     Head: Normocephalic and atraumatic.     Right Ear: Tympanic membrane, ear canal and external ear normal.     Left Ear: Tympanic membrane, ear canal and external ear normal.  Eyes:     Pupils: Pupils are equal, round, and reactive to light.  Cardiovascular:     Rate and Rhythm: Normal rate and regular rhythm.     Pulses: Normal pulses.     Heart sounds: No murmur heard.   No gallop.  Pulmonary:     Effort: Pulmonary effort is normal. No respiratory distress.     Breath sounds: Normal breath sounds. No wheezing or rhonchi.  Abdominal:     General: Bowel sounds are normal. There is no distension.     Palpations: Abdomen is soft.     Tenderness: There is no abdominal tenderness. There is no guarding.     Hernia: No hernia is present.  Musculoskeletal:     Cervical back: Neck supple.     Right lower leg: 2+ Pitting Edema present.     Left lower leg: 2+ Pitting Edema present.  Lymphadenopathy:     Cervical: No cervical adenopathy.  Skin:  General: Skin is warm and dry.  Neurological:     Mental Status: He is alert and oriented to person, place, and time.    BP 132/82 (BP Location: Left Arm, Patient Position: Sitting, Cuff Size: Large)    Pulse 90    Temp 98.4 F (36.9 C) (Oral)    Resp (!) 22    Ht 5\' 11"  (1.803 m)    SpO2 97%    BMI 41.84 kg/m  Wt Readings from Last 3 Encounters:  08/30/21 300 lb (136.1 kg)  07/21/21 300 lb (136.1 kg)  05/15/21 290 lb (131.5 kg)    Diabetic Foot Exam - Simple   No data filed    Lab Results   Component Value Date   WBC 7.5 08/30/2021   HGB 13.8 08/30/2021   HCT 41.5 08/30/2021   PLT 327 08/30/2021   GLUCOSE 230 (H) 08/30/2021   CHOL 140 05/18/2021   TRIG 104.0 05/18/2021   HDL 56.60 05/18/2021   LDLDIRECT 124.0 05/02/2017   LDLCALC 63 05/18/2021   ALT 27 08/30/2021   AST 20 08/30/2021   NA 134 (L) 08/30/2021   K 4.1 08/30/2021   CL 101 08/30/2021   CREATININE 1.00 08/30/2021   BUN 21 (H) 08/30/2021   CO2 22 08/30/2021   TSH 2.57 02/14/2021   PSA 1.15 03/10/2020   INR 1.1 07/30/2013   HGBA1C 7.5 (H) 05/18/2021   MICROALBUR <0.7 05/02/2017    Lab Results  Component Value Date   TSH 2.57 02/14/2021   Lab Results  Component Value Date   WBC 7.5 08/30/2021   HGB 13.8 08/30/2021   HCT 41.5 08/30/2021   MCV 87.0 08/30/2021   PLT 327 08/30/2021   Lab Results  Component Value Date   NA 134 (L) 08/30/2021   K 4.1 08/30/2021   CO2 22 08/30/2021   GLUCOSE 230 (H) 08/30/2021   BUN 21 (H) 08/30/2021   CREATININE 1.00 08/30/2021   BILITOT 0.5 08/30/2021   ALKPHOS 72 08/30/2021   AST 20 08/30/2021   ALT 27 08/30/2021   PROT 7.8 08/30/2021   ALBUMIN 3.8 08/30/2021   CALCIUM 9.2 08/30/2021   ANIONGAP 11 08/30/2021   GFR 95.44 05/18/2021   Lab Results  Component Value Date   CHOL 140 05/18/2021   Lab Results  Component Value Date   HDL 56.60 05/18/2021   Lab Results  Component Value Date   LDLCALC 63 05/18/2021   Lab Results  Component Value Date   TRIG 104.0 05/18/2021   Lab Results  Component Value Date   CHOLHDL 2 05/18/2021   Lab Results  Component Value Date   HGBA1C 7.5 (H) 05/18/2021      Ekg-- +artifact   no change from 02/2021 Assessment & Plan:   Problem List Items Addressed This Visit       Unprioritized   HTN (hypertension) (Chronic)    Well controlled, no changes to meds. Encouraged heart healthy diet such as the DASH diet and exercise as tolerated.       Relevant Medications   torsemide (DEMADEX) 20 MG tablet    Hyperlipemia (Chronic)    Encourage heart healthy diet such as MIND or DASH diet, increase exercise, avoid trans fats, simple carbohydrates and processed foods, consider a krill or fish or flaxseed oil cap daily.       Relevant Medications   torsemide (DEMADEX) 20 MG tablet   Morbid obesity (HCC)   Interstitial lung disease ([place])   Drug abuse and dependence (  HCC)   Psoriatic arthritis ([place])   Relevant Medications   traMADol (ULTRAM) 50 MG tablet   Chronic low back pain   Relevant Medications   traMADol (ULTRAM) 50 MG tablet   Type 2 diabetes mellitus with hyperglycemia, with long-term current use of insulin (HCC)   CIDP (chronic inflammatory demyelinating polyneuropathy) ([place])    Per neuro and will start pain management next month      Diabetes mellitus ([place])    Per endo      Lower extremity edema - Primary    Elevate legs  D/c furosemide and start torsemide  F/u 2-3 weeks Check labs      Relevant Medications   torsemide (DEMADEX) 20 MG tablet   Other Relevant Orders   DG Chest 2 View   EKG 12-Lead (Completed)   ECHOCARDIOGRAM COMPLETE   CBC with Differential/Platelet   Comprehensive metabolic panel   TSH   Other Visit Diagnoses     SOB (shortness of breath)       Relevant Orders   ECHOCARDIOGRAM COMPLETE       Meds ordered this encounter  Medications   DISCONTD: traMADol (ULTRAM) 50 MG tablet    Sig: 2 po q6 h prn    Dispense:  120 tablet    Refill:  1   torsemide (DEMADEX) 20 MG tablet    Sig: Take 1 tablet (20 mg total) by mouth daily.    Dispense:  90 tablet    Refill:  1   traMADol (ULTRAM) 50 MG tablet    Sig: 2 po q6 h prn    Dispense:  120 tablet    Refill:  1    I,Tyler Brewer,acting as a Education administrator for [employer], DO.,have documented all relevant documentation on the behalf of [name], DO,as directed by  [name], DO while in the presence of [place], DO. , personally  preformed the services described in this documentation.  All medical record entries made by the scribe were at my direction and in my presence.  I have reviewed the chart and discharge instructions (if applicable) and agree that the record reflects my personal performance and is accurate and complete. 09/08/2021

## 2021-09-08 NOTE — Assessment & Plan Note (Signed)
Per neuro and will start pain management next month

## 2021-09-09 ENCOUNTER — Encounter: Payer: Self-pay | Admitting: Family Medicine

## 2021-09-09 LAB — CBC WITH DIFFERENTIAL/PLATELET
Absolute Monocytes: 520 cells/uL (ref 200–950)
Basophils Absolute: 52 cells/uL (ref 0–200)
Basophils Relative: 1.2 %
Eosinophils Absolute: 120 cells/uL (ref 15–500)
Eosinophils Relative: 2.8 %
HCT: 40 % (ref 38.5–50.0)
Hemoglobin: 13.6 g/dL (ref 13.2–17.1)
Lymphs Abs: 1041 cells/uL (ref 850–3900)
MCH: 29.2 pg (ref 27.0–33.0)
MCHC: 34 g/dL (ref 32.0–36.0)
MCV: 86 fL (ref 80.0–100.0)
MPV: 9.2 fL (ref 7.5–12.5)
Monocytes Relative: 12.1 %
Neutro Abs: 2567 cells/uL (ref 1500–7800)
Neutrophils Relative %: 59.7 %
Platelets: 293 10*3/uL (ref 140–400)
RBC: 4.65 10*6/uL (ref 4.20–5.80)
RDW: 12.9 % (ref 11.0–15.0)
Total Lymphocyte: 24.2 %
WBC: 4.3 10*3/uL (ref 3.8–10.8)

## 2021-09-09 LAB — COMPREHENSIVE METABOLIC PANEL
AG Ratio: 1.1 (calc) (ref 1.0–2.5)
ALT: 21 U/L (ref 9–46)
AST: 17 U/L (ref 10–35)
Albumin: 3.9 g/dL (ref 3.6–5.1)
Alkaline phosphatase (APISO): 82 U/L (ref 35–144)
BUN: 25 mg/dL (ref 7–25)
CO2: 20 mmol/L (ref 20–32)
Calcium: 9.7 mg/dL (ref 8.6–10.3)
Chloride: 100 mmol/L (ref 98–110)
Creat: 0.97 mg/dL (ref 0.70–1.30)
Globulin: 3.7 g/dL (calc) (ref 1.9–3.7)
Glucose, Bld: 307 mg/dL — ABNORMAL HIGH (ref 65–99)
Potassium: 4.8 mmol/L (ref 3.5–5.3)
Sodium: 133 mmol/L — ABNORMAL LOW (ref 135–146)
Total Bilirubin: 0.2 mg/dL (ref 0.2–1.2)
Total Protein: 7.6 g/dL (ref 6.1–8.1)

## 2021-09-11 ENCOUNTER — Encounter: Payer: Self-pay | Admitting: Family Medicine

## 2021-09-12 DIAGNOSIS — G4733 Obstructive sleep apnea (adult) (pediatric): Secondary | ICD-10-CM | POA: Diagnosis not present

## 2021-09-12 DIAGNOSIS — E7800 Pure hypercholesterolemia, unspecified: Secondary | ICD-10-CM | POA: Diagnosis not present

## 2021-09-12 DIAGNOSIS — E1121 Type 2 diabetes mellitus with diabetic nephropathy: Secondary | ICD-10-CM | POA: Diagnosis not present

## 2021-09-12 DIAGNOSIS — E1136 Type 2 diabetes mellitus with diabetic cataract: Secondary | ICD-10-CM | POA: Diagnosis not present

## 2021-09-12 DIAGNOSIS — I10 Essential (primary) hypertension: Secondary | ICD-10-CM | POA: Diagnosis not present

## 2021-09-12 DIAGNOSIS — R69 Illness, unspecified: Secondary | ICD-10-CM | POA: Diagnosis not present

## 2021-09-12 DIAGNOSIS — E1142 Type 2 diabetes mellitus with diabetic polyneuropathy: Secondary | ICD-10-CM | POA: Diagnosis not present

## 2021-09-12 DIAGNOSIS — M5441 Lumbago with sciatica, right side: Secondary | ICD-10-CM | POA: Diagnosis not present

## 2021-09-12 DIAGNOSIS — I2510 Atherosclerotic heart disease of native coronary artery without angina pectoris: Secondary | ICD-10-CM | POA: Diagnosis not present

## 2021-09-12 DIAGNOSIS — J849 Interstitial pulmonary disease, unspecified: Secondary | ICD-10-CM | POA: Diagnosis not present

## 2021-09-12 NOTE — Telephone Encounter (Signed)
Okay to write note.

## 2021-09-14 ENCOUNTER — Other Ambulatory Visit: Payer: Self-pay | Admitting: Family Medicine

## 2021-09-14 ENCOUNTER — Encounter: Payer: Self-pay | Admitting: Internal Medicine

## 2021-09-14 ENCOUNTER — Telehealth: Payer: Self-pay

## 2021-09-14 DIAGNOSIS — R600 Localized edema: Secondary | ICD-10-CM

## 2021-09-14 DIAGNOSIS — M5441 Lumbago with sciatica, right side: Secondary | ICD-10-CM | POA: Diagnosis not present

## 2021-09-14 DIAGNOSIS — I10 Essential (primary) hypertension: Secondary | ICD-10-CM | POA: Diagnosis not present

## 2021-09-14 DIAGNOSIS — I2510 Atherosclerotic heart disease of native coronary artery without angina pectoris: Secondary | ICD-10-CM | POA: Diagnosis not present

## 2021-09-14 DIAGNOSIS — E7800 Pure hypercholesterolemia, unspecified: Secondary | ICD-10-CM | POA: Diagnosis not present

## 2021-09-14 DIAGNOSIS — E1121 Type 2 diabetes mellitus with diabetic nephropathy: Secondary | ICD-10-CM | POA: Diagnosis not present

## 2021-09-14 DIAGNOSIS — R69 Illness, unspecified: Secondary | ICD-10-CM | POA: Diagnosis not present

## 2021-09-14 DIAGNOSIS — E1136 Type 2 diabetes mellitus with diabetic cataract: Secondary | ICD-10-CM | POA: Diagnosis not present

## 2021-09-14 DIAGNOSIS — G4733 Obstructive sleep apnea (adult) (pediatric): Secondary | ICD-10-CM | POA: Diagnosis not present

## 2021-09-14 DIAGNOSIS — E1142 Type 2 diabetes mellitus with diabetic polyneuropathy: Secondary | ICD-10-CM | POA: Diagnosis not present

## 2021-09-14 DIAGNOSIS — J849 Interstitial pulmonary disease, unspecified: Secondary | ICD-10-CM | POA: Diagnosis not present

## 2021-09-14 MED ORDER — TORSEMIDE 20 MG PO TABS: ORAL_TABLET | ORAL | 2 refills | Status: DC

## 2021-09-14 NOTE — Telephone Encounter (Signed)
Nurse from [facility] home health called to report patient's Bp was elevated today    170/92 and then rechecked again and it was 160/88   If any questions, call back number: [phone]

## 2021-09-15 ENCOUNTER — Ambulatory Visit: Payer: Medicare HMO | Admitting: *Deleted

## 2021-09-15 DIAGNOSIS — M545 Low back pain, unspecified: Secondary | ICD-10-CM

## 2021-09-15 DIAGNOSIS — R600 Localized edema: Secondary | ICD-10-CM

## 2021-09-15 DIAGNOSIS — E1165 Type 2 diabetes mellitus with hyperglycemia: Secondary | ICD-10-CM

## 2021-09-15 DIAGNOSIS — E785 Hyperlipidemia, unspecified: Secondary | ICD-10-CM

## 2021-09-15 DIAGNOSIS — F1520 Other stimulant dependence, uncomplicated: Secondary | ICD-10-CM

## 2021-09-15 DIAGNOSIS — E1159 Type 2 diabetes mellitus with other circulatory complications: Secondary | ICD-10-CM

## 2021-09-15 DIAGNOSIS — L4050 Arthropathic psoriasis, unspecified: Secondary | ICD-10-CM

## 2021-09-15 DIAGNOSIS — G6181 Chronic inflammatory demyelinating polyneuritis: Secondary | ICD-10-CM

## 2021-09-15 DIAGNOSIS — R2689 Other abnormalities of gait and mobility: Secondary | ICD-10-CM

## 2021-09-15 DIAGNOSIS — I10 Essential (primary) hypertension: Secondary | ICD-10-CM

## 2021-09-15 DIAGNOSIS — Z794 Long term (current) use of insulin: Secondary | ICD-10-CM

## 2021-09-15 DIAGNOSIS — E0842 Diabetes mellitus due to underlying condition with diabetic polyneuropathy: Secondary | ICD-10-CM

## 2021-09-15 NOTE — Chronic Care Management (AMB) (Signed)
Chronic Care Management    Clinical Social Work Note  09/15/2021 Name: Tyler Brewer MRN: [id number] DOB: [birth year]  Tyler Brewer is a 61 y.o. year old male who is a primary care patient of [name], DO. The CCM team was consulted to assist the patient with chronic disease management and/or care coordination needs related to: [employer] and Level of Care Concerns.   Engaged with patient by telephone for follow up visit in response to provider referral for social work chronic care management and care coordination services.   Consent to Services:  The patient was given information about Chronic Care Management services, agreed to services, and gave verbal consent prior to initiation of services.  Please see initial visit note for detailed documentation.   Patient agreed to services and consent obtained.   Assessment: Review of patient past medical history, allergies, medications, and health status, including review of relevant consultants reports was performed today as part of a comprehensive evaluation and provision of chronic care management and care coordination services.     SDOH (Social Determinants of Health) assessments and interventions performed:    Advanced Directives Status: Not addressed in this encounter.  CCM Care Plan  No Known Allergies  Outpatient Encounter Medications as of 09/15/2021  Medication Sig   acetaminophen (TYLENOL) 500 MG tablet Take 500-1,000 mg by mouth every 6 (six) hours as needed for mild pain.   AMBULATORY NON FORMULARY MEDICATION Medication Name: Audelia Hives Wheelchair   amLODipine (NORVASC) 10 MG tablet Take 1 tablet (10 mg total) by mouth daily.   COMFORT EZ INSULIN SYRINGE 31G X 5/16" 1 ML MISC    Cyanocobalamin (VITAMIN B-12 PO) Take 1 tablet by mouth daily.   diclofenac Sodium (VOLTAREN) 1 % GEL Apply 4 g topically 4 (four) times daily as needed (joint pain).   DULoxetine (CYMBALTA) 60 MG capsule Take 1 capsule by mouth  daily.   hydrALAZINE (APRESOLINE) 50 MG tablet TAKE 1 TABLET(50 MG) BY MOUTH EVERY 8 HOURS   hydrOXYzine (ATARAX) 25 MG tablet Take 1 tablet (25 mg total) by mouth every 6 (six) hours as needed for anxiety.   ibuprofen (ADVIL) 200 MG tablet Take 400 mg by mouth every 6 (six) hours as needed for mild pain.   immune globulin, human, (GAMMAGARD S/D) 5 g injection Inject into the vein every 21 ( twenty-one) days.   insulin aspart (NOVOLOG FLEXPEN) 100 UNIT/ML FlexPen Max daily 80 units per scale   insulin degludec (TRESIBA FLEXTOUCH) 100 UNIT/ML FlexTouch Pen Inject 30 Units into the skin daily.   Insulin Pen Needle 31G X 5 MM MISC 1 Device by Does not apply route in the morning, at noon, in the evening, and at bedtime.   losartan (COZAAR) 100 MG tablet TAKE ONE TABLET BY MOUTH DAILY   MAGNESIUM PO Take 1 tablet by mouth daily.   meclizine (ANTIVERT) 25 MG tablet Take 1 tablet (25 mg total) by mouth 3 (three) times daily as needed for dizziness.   meloxicam (MOBIC) 15 MG tablet Take 1 tablet (15 mg total) by mouth daily.   Menthol, Topical Analgesic, (ICY HOT EX) Apply 1 application topically as needed (pain).   metFORMIN (GLUCOPHAGE-XR) 500 MG 24 hr tablet Take 2 tablets (1,000 mg total) by mouth in the morning and at bedtime.   methocarbamol (ROBAXIN) 500 MG tablet Take 500 mg by mouth 4 (four) times daily as needed for muscle spasms.   Multiple Vitamin (MULTIVITAMIN) tablet Take 1 tablet by mouth daily.  NONFORMULARY OR COMPOUNDED ITEM Power wheelchair   NONFORMULARY OR COMPOUNDED ITEM Manuel wheelchair   polyethylene glycol (MIRALAX / GLYCOLAX) 17 g packet Take 17 g by mouth daily as needed for mild constipation.   pravastatin (PRAVACHOL) 40 MG tablet Take 1 tablet (40 mg total) by mouth daily.   sildenafil (VIAGRA) 100 MG tablet Take 0.5-1 tablets (50-100 mg total) by mouth daily as needed for erectile dysfunction.   tamsulosin (FLOMAX) 0.4 MG CAPS capsule TAKE ONE CAPSULE BY MOUTH ONE TIME  DAILY   torsemide (DEMADEX) 20 MG tablet 2 po qd   traMADol (ULTRAM) 50 MG tablet 2 po q6 h prn   No facility-administered encounter medications on file as of 09/15/2021.    Patient Active Problem List   Diagnosis Date Noted   CIDP (chronic inflammatory demyelinating polyneuropathy) ([place]) 01/12/2021   Diabetes mellitus ([place]) 01/03/2021   Type 2 diabetes mellitus with hyperglycemia, with long-term current use of insulin ([place]) 01/03/2021   Lower extremity edema 11/28/2020   Chronic low back pain 11/28/2020   Psoriatic arthritis ([place]) 11/28/2020   Thoracic aortic aneurysm without rupture 11/28/2020   Bilateral leg weakness 11/05/2020   Acute left-sided low back pain with right-sided sciatica 07/19/2020   Hip pain, acute, left 07/05/2020   Tremor 07/05/2020   Weakness 07/05/2020   Balance problem 03/11/2020   Tinea cruris 03/11/2020   Cellulitis of left groin 03/11/2020   Acute pain of right shoulder 03/11/2020   Interstitial lung disease ([place]) 05/02/2017   Pansinusitis 09/26/2016   Diabetic polyneuropathy associated with diabetes mellitus due to underlying condition ([place]) 05/08/2016   Methamphetamine use disorder, severe, dependence ([place]) 02/11/2016   Substance induced mood disorder ([place]) 02/11/2016   Exertional dyspnea 11/30/2015   ADD (attention deficit disorder) 09/29/2015   Binge eating 09/29/2015   Syncope 05/05/2012   Diarrhea 05/05/2012   Panic attacks 05/05/2012   OSA (obstructive sleep apnea) 05/05/2012   HTN (hypertension) 05/05/2012   Dyslipidemia 05/05/2012   CAD (coronary artery disease)  cardiac cath with moderate disease in a septal branch of the ramus intermedius 04/01/2012   Chest pain 04/01/2012   Drug abuse and dependence ([place]) 04/01/2012   Family history of coronary artery disease 03/31/2012   Sleep apnea, on C-pap 03/31/2012   Hyperlipemia 03/30/2012   HTN (hypertension), benign 03/30/2012   Morbid obesity ([place]) 03/30/2012   DM type 2, uncontrolled,  with neuropathy ([place]) 03/30/2012   Depression with suicidal ideation 03/30/2012    Conditions to be addressed/monitored: HTN and DMII.  Level of Care Concerns, ADL/IADL Limitations, Limited Access to Caregiver, Cognitive Deficits, Memory Deficits, and Lacks Knowledge of [employer].  Care Plan : LCSW Plan of Care  Updates made by Francis Gaines, LCSW since 09/15/2021 12:00 AM     Problem: Improve Quality of Life through [place] and Durable Medical Equipment. Resolved 09/15/2021  Priority: High     Long-Range Goal: Improve Quality of Life through [place] and [place]. Completed 09/15/2021  Start Date: 03/30/2021  Expected End Date: 09/15/2021  This Visit's Progress: On track  Recent Progress: On track  Priority: High  Note:   Current Barriers:   Patient with History of Falls/Fall Risk, Unsteady Gait, Impaired Mobility, Weakness, Tremor, Syncope, Balance Problem and Lower Extremity Edema needs assistance with initiating short-term rehabilitative services in a skilled nursing facility or long-term care in an assisted living facility. Clinical Goal(s):  Patient will receive assistance with initiating home health services and durable medical equipment.  Interventions: Collaboration with Primary Care Physician, Dr. [name] regarding development and update of comprehensive plan of care as evidenced by provider attestation and co-signature. Emailed patient ([email or web address]) a list of community agencies and resources that may be able to assist with payment of diabetic footwear. Patient Goals/Self-Care Activities:  LCSW collaboration with Dennis Bast from [facility] [phone]), to confirm continued eligibility for [place] and Physical Therapy Services. Continue to receive Infusion Therapy for treatment of Chronic Inflammatory Demyelinating Polyneuropathy, through Dr. Deniece Portela, Neurologist with [facility]. Collaboration with representative from [employer] [phone]), to confirm delivery of your new power wheelchair.   Continue to contact list of community agencies and resources that offer financial assistance with diabetic footwear. Contact LCSW directly (# [phone]) if you have questions, need assistance, or if additional social work needs are identified in the near future.   No Follow-Up Required.     [place] Licensed Clinical Social Worker [facility] [phone]

## 2021-09-15 NOTE — Patient Instructions (Signed)
Visit Information  Thank you for taking time to visit with me today. Please don't hesitate to contact me if I can be of assistance to you before our next scheduled telephone appointment.  Following are the goals we discussed today:  Patient Goals/Self-Care Activities:  LCSW collaboration with Dennis Bast from [facility] [phone]), to confirm continued eligibility for [place] and Physical Therapy Services. Continue to receive Infusion Therapy for treatment of Chronic Inflammatory Demyelinating Polyneuropathy, through Dr. Deniece Portela, Neurologist with [facility]. Collaboration with representative from [employer] [phone]), to confirm delivery of your new power wheelchair.   Continue to contact list of community agencies and resources that offer financial assistance with diabetic footwear. Contact LCSW directly (# [phone]) if you have questions, need assistance, or if additional social work needs are identified in the near future.   No Follow-Up Required.    Please call the care guide team at [phone] if you need to cancel or reschedule your appointment.   If you are experiencing a Mental Health or [place] or need someone to talk to, please call the Suicide and Crisis Lifeline: 988 call the [place] National Suicide Prevention Lifeline: 782-690-2686 or TTY: 7808260321 TTY 346-738-0806) to talk to a trained counselor call 1-800-273-TALK (toll free, 24 hour hotline) go to [facility] [address], [place] [id number]) call the [place]: [phone] call 911   Patient verbalizes understanding of instructions and care plan provided today and agrees to view in [place]. Active MyChart status confirmed with patient.    [place] Licensed Clinical Social Worker [facility] [employer] [phone]

## 2021-09-15 NOTE — Telephone Encounter (Signed)
Pt called. LDVM 

## 2021-09-15 NOTE — Telephone Encounter (Signed)
1/26  Breakfast-136  Lunch -280  Dinner -246   1/25  Breakfast  182  Lunch  high  Dinner 338   Tresiba  30 units in evening  Novolog 16 units in morning, 20-24 units lunch and evening

## 2021-09-18 NOTE — Progress Notes (Deleted)
09/19/21- 60 yoM former smoker for sleep evaluation courtesy of Dr Carollee Herter with concern of OSA Medical problem list includes CAD, HTN, Thoraccic Aortic Aneurysm, ILD, OSA, Pansinusitis, IBS, DM2, Chronic Inflammatory Demyelinating Polyneuropathy, Psoriatic Arthritis, Depression, Drug Use and Dependence, Dyslipidemia, Morbid Obesity,  NPSG 12/02/15 AHI 30.4/ hr, desaturation to 66% (mean 95.2%), body weight 325 lbs CPAP auto 5-15/ [employer] Epworth score- Body weight today- Covid vax- Flu vax-

## 2021-09-19 ENCOUNTER — Institutional Professional Consult (permissible substitution): Payer: Medicare HMO | Admitting: Internal Medicine

## 2021-09-19 DIAGNOSIS — F32A Depression, unspecified: Secondary | ICD-10-CM

## 2021-09-19 DIAGNOSIS — R45851 Suicidal ideations: Secondary | ICD-10-CM

## 2021-09-19 DIAGNOSIS — Z794 Long term (current) use of insulin: Secondary | ICD-10-CM

## 2021-09-19 DIAGNOSIS — R69 Illness, unspecified: Secondary | ICD-10-CM | POA: Diagnosis not present

## 2021-09-19 DIAGNOSIS — F418 Other specified anxiety disorders: Secondary | ICD-10-CM

## 2021-09-19 DIAGNOSIS — I10 Essential (primary) hypertension: Secondary | ICD-10-CM | POA: Diagnosis not present

## 2021-09-19 DIAGNOSIS — E0842 Diabetes mellitus due to underlying condition with diabetic polyneuropathy: Secondary | ICD-10-CM

## 2021-09-19 DIAGNOSIS — G4733 Obstructive sleep apnea (adult) (pediatric): Secondary | ICD-10-CM | POA: Diagnosis not present

## 2021-09-19 DIAGNOSIS — I2510 Atherosclerotic heart disease of native coronary artery without angina pectoris: Secondary | ICD-10-CM | POA: Diagnosis not present

## 2021-09-19 DIAGNOSIS — E1159 Type 2 diabetes mellitus with other circulatory complications: Secondary | ICD-10-CM

## 2021-09-19 DIAGNOSIS — E1136 Type 2 diabetes mellitus with diabetic cataract: Secondary | ICD-10-CM | POA: Diagnosis not present

## 2021-09-19 DIAGNOSIS — E785 Hyperlipidemia, unspecified: Secondary | ICD-10-CM

## 2021-09-19 DIAGNOSIS — E1142 Type 2 diabetes mellitus with diabetic polyneuropathy: Secondary | ICD-10-CM | POA: Diagnosis not present

## 2021-09-19 DIAGNOSIS — M5441 Lumbago with sciatica, right side: Secondary | ICD-10-CM | POA: Diagnosis not present

## 2021-09-19 DIAGNOSIS — E7800 Pure hypercholesterolemia, unspecified: Secondary | ICD-10-CM | POA: Diagnosis not present

## 2021-09-19 DIAGNOSIS — E1165 Type 2 diabetes mellitus with hyperglycemia: Secondary | ICD-10-CM

## 2021-09-19 DIAGNOSIS — J849 Interstitial pulmonary disease, unspecified: Secondary | ICD-10-CM | POA: Diagnosis not present

## 2021-09-19 DIAGNOSIS — E1121 Type 2 diabetes mellitus with diabetic nephropathy: Secondary | ICD-10-CM | POA: Diagnosis not present

## 2021-09-21 ENCOUNTER — Ambulatory Visit (INDEPENDENT_AMBULATORY_CARE_PROVIDER_SITE_OTHER): Payer: Medicare HMO | Admitting: Pharmacist

## 2021-09-21 DIAGNOSIS — E1136 Type 2 diabetes mellitus with diabetic cataract: Secondary | ICD-10-CM | POA: Diagnosis not present

## 2021-09-21 DIAGNOSIS — I10 Essential (primary) hypertension: Secondary | ICD-10-CM

## 2021-09-21 DIAGNOSIS — E7800 Pure hypercholesterolemia, unspecified: Secondary | ICD-10-CM | POA: Diagnosis not present

## 2021-09-21 DIAGNOSIS — E1121 Type 2 diabetes mellitus with diabetic nephropathy: Secondary | ICD-10-CM | POA: Diagnosis not present

## 2021-09-21 DIAGNOSIS — R600 Localized edema: Secondary | ICD-10-CM

## 2021-09-21 DIAGNOSIS — G4733 Obstructive sleep apnea (adult) (pediatric): Secondary | ICD-10-CM | POA: Diagnosis not present

## 2021-09-21 DIAGNOSIS — M5441 Lumbago with sciatica, right side: Secondary | ICD-10-CM | POA: Diagnosis not present

## 2021-09-21 DIAGNOSIS — I2510 Atherosclerotic heart disease of native coronary artery without angina pectoris: Secondary | ICD-10-CM | POA: Diagnosis not present

## 2021-09-21 DIAGNOSIS — E1159 Type 2 diabetes mellitus with other circulatory complications: Secondary | ICD-10-CM

## 2021-09-21 DIAGNOSIS — R69 Illness, unspecified: Secondary | ICD-10-CM | POA: Diagnosis not present

## 2021-09-21 DIAGNOSIS — J849 Interstitial pulmonary disease, unspecified: Secondary | ICD-10-CM | POA: Diagnosis not present

## 2021-09-21 DIAGNOSIS — E785 Hyperlipidemia, unspecified: Secondary | ICD-10-CM

## 2021-09-21 DIAGNOSIS — E1142 Type 2 diabetes mellitus with diabetic polyneuropathy: Secondary | ICD-10-CM | POA: Diagnosis not present

## 2021-09-22 ENCOUNTER — Encounter: Payer: Self-pay | Admitting: Family Medicine

## 2021-09-22 ENCOUNTER — Telehealth: Payer: Self-pay | Admitting: Family Medicine

## 2021-09-22 NOTE — Telephone Encounter (Signed)
Caller/Agency: [facility] Number: [phone] Requesting OT/PT/Skilled Nursing/Social Work/Speech Therapy: PT Frequency: for re-certification

## 2021-09-22 NOTE — Chronic Care Management (AMB) (Signed)
Chronic Care Management Pharmacy Note  09/22/2021 Name:  Tyler Brewer MRN:  [id number] DOB:  Oct 27, 1960  Summary:  Patient took torsemide only a few days - states that it caused him to urinate too much. He restarted furosemide 17m daily but admits it does not relieve edema as well as torsemide 248mdaily did.  Recommended he try torsemide 1014maily. Sent him a pill cutter.   Subjective: Tyler Brewer an 60 49o. year old male who is a primary patient of Tyler Brewer.  The CCM team was consulted for assistance with disease management and care coordination needs.    Engaged with patient by telephone for  follow up   in response to provider referral for pharmacy case management and/or care coordination services.   Consent to Services:  The patient was given information about Chronic Care Management services, agreed to services, and gave verbal consent prior to initiation of services.  Please see initial visit note for detailed documentation.   Patient Care Team: LowCarollee HertervoAlferd ApaO as PCP - General (Family Medicine) PatAlda BertholdO as Consulting Physician (Neurology) [name] as Consulting Physician (Cardiothoracic Surgery) BarMarlaine HindD as Consulting Physician (Physical Medicine and Rehabilitation) WalLuretha RuedN as Case Manager EckCherre RobinsPH-CPP (Pharmacist) [name], [name] as Consulting Physician (Endocrinology) Haddix, [name] as Consulting Physician (Orthopedic Surgery)  Recent office visits: 09/08/2021 - PCP (Dr LowCarollee Hertereen for follow up and lower extremity edema. Changed furosemide to torsemide 54m69mily  06/30/2021 - Fam Med (Dr LownEtter SjogrenhCheri Rousen for edema / pain / anxiety. Furosemide 40mg30m a few days then back to 54mg 80my; Refilled hydroxyzine 25mg u64m every 6 hours as needed. Refilled tramadol. Has appt to see pain management soon 05/18/2021 - [facility] (Dr Lowne[facility]. Noted to have increased anxiety, panic attackd and insomnia. Referred to pulmonology for OSA. Referral to urology for testicle pain. Recommended f/u with psychiatry   Recent consult visits: 09/14/2021 - Endo (Dr ShamlefKelton Pillar call - uncontrolled BG. Please increase your NovoLog to 20 units with breakfast, 24 units with lunch and 24 units with supper. Also recommended use Novolog correctional insulin: ADD extra units on insulin to your meal-time novolog dose if your blood sugars are higher than 150. (See notes for sliding / correctional scale recommended)  07/27/2021 [facility] 07/[facility]/2021 [facility]  07/11/2021 - Endo (Dr ShamlefKelton Pillariabetes, Decreased NPH does to 30 units daily. Increased Reg insuiln to 14 units wiht breakfast; 16 units with lunch and evening meals. Advised to stop nighttime snacking 06/27/2021 - Neurology (Dr McGhee)Mikle [name] and worsening lower extremity weakness. continue IVIG 1 g/kg q 3 weeks, [facility], recommend strict glycemic control as hyperglycemia can worsen underlying neuropathy. CMP reviewed, Cr WNL, glucose elevated, Na slightly low in the setting of hyperglycemia. CBC unremarkable. Continue Gabapentin and Duloxetine for neuropathic pain 07/04/2021 and 11/16/20222 - [facility] infusion of IVIG 06/13/2021 and 06/14/2021- [facility] infusion of [facility]  03/27/2021 - N[place]ir[place] (Tyler Brewer - Neurology - (Dr Patel) [name]. No medication changes.  03/24/2021 - Neurology (Dr McGhee Mikle Bosworthum / [facility]. Repeat EMG/NCS to assess for diabetic amyotrophy versus demyelinating process. Labs checked.  No med changes. F/U 3 months.    Hospital visits: 07/21/2021 - ED Visit at [facility]  for flank pain.  Prescribed Miralax 17 grams daily as needed. Ultrasound negative for hydronephrosis.  06/24/2021 - ED - [facility] - for edema per EMS report. Unable to see notes from ED visit.  05/14/2021 - ED Visit - Seen for constipation. Recommended Miralax as needed.   05/12/2021 ED Visit - Seen for lumbar strain and contusion of hip and thigh.  05/11/2021 - ED Visit - fall with fracture of medial malleolus of left tibia and of proximal phalanx of left great toe.  04/26/2021 to 05/05/2021 hospital admission at [place] for worsening weakness and falls. Received 5 days of IVIG treatment with gradual improvement in lower extremity weakness.  Medications started at discharge:  IVIG every 2 weeks, duloxetine 38m daily Medications changed at discharge:  gabapentin 3059m- take 4 caps = 120059m times a day; Novolin N - 20 units daily at bedtime; Novolin R 7 units 3 times a day before meals. Losartan 100m5mily. Tramadol 50g every 6 hours as needed for pain for up to 14 days.  Medications stopped at discharge:  ciprofloxacin, hydralazine, hydrocodone/APAP, Novolog Mix 70/30; nicotine 21mg77mches; nystatin powder, pregabalin 75mg,53mtra DS; TresibTyler Aas/2022 [facility]. Performed scrotal U/S to r/o epididymitis or other pathology. Only showed small bilateral hydroceles. No med changes.  [facility]/2021 [facility] Noted also to have urinary retention. MRI showed no changes or spinal cord compression. GIven tramadol, lorazepam and meloxicam in hospital. Patient was noted to be upset at dicharge because he wanted to be admitted. 03/16/2021 - ED Visit - for neuropathy - no med changes.    Objective:  Lab Results  Component Value Date   CREATININE 0.97 09/08/2021   CREATININE 1.00 08/30/2021   CREATININE 0.87 05/18/2021    Lab Results  Component Value Date    HGBA1C 7.5 (H) 05/18/2021   Last diabetic Eye exam: No results found for: HMDIABEYEEXA  Last diabetic Foot exam: No results found for: HMDIABFOOTEX      Component Value Date/Time   CHOL 140 05/18/2021 1612   TRIG 104.0 05/18/2021 1612   HDL 56.60 05/18/2021 1612   CHOLHDL 2 05/18/2021 1612   VLDL 20.8 05/18/2021 1612   LDLCALC 63 05/18/2021 1612   LDLDIRECT 124.0 05/02/2017 1507    Hepatic Function Latest Ref Rng & Units 09/08/2021 08/30/2021 05/18/2021  Total Protein 6.1 - 8.1 g/dL 7.6 7.8 7.3  Albumin 3.5 - 5.0 g/dL - 3.8 3.9  AST 10 - 35 U/L '17 20 11  ' ALT 9 - 46 U/L '21 27 25  ' Alk Phosphatase 38 - 126 U/L - 72 91  Total Bilirubin 0.2 - 1.2 mg/dL 0.2 0.5 0.3    Lab Results  Component Value Date/Time   TSH 2.84 09/08/2021 02:17 PM   TSH 2.57 02/14/2021 02:19 PM    CBC Latest Ref Rng & Units 09/08/2021 08/30/2021 05/18/2021  WBC 3.8 - 10.8 Thousand/uL 4.3 7.5 6.3  Hemoglobin 13.2 - 17.1 g/dL 13.6 13.8 13.1  Hematocrit 38.5 - 50.0 % 40.0 41.5 38.3(L)  Platelets 140 - 400 Thousand/uL 293 327 299.0    No results found for: VD25OH  Clinical ASCVD: Yes  The 10-year ASCVD risk score (Arnett DK, et al., 2019) is: 10.7%   Values used to calculate  the score:     Age: 61 years     Sex: Male     Is Non-Hispanic African American: No     Diabetic: Yes     Tobacco smoker: No     Systolic Blood Pressure: 568 mmHg     Is BP treated: Yes     HDL Cholesterol: 56.6 mg/dL     Total Cholesterol: 140 mg/dL     Social History   Tobacco Use  Smoking Status Former   Packs/day: 0.50   Types: Cigarettes   Quit date: 04/27/2021   Years since quitting: 0.4  Smokeless Tobacco Never   BP Readings from Last 3 Encounters:  09/08/21 132/82  08/30/21 131/62  07/21/21 (!) 204/105   Pulse Readings from Last 3 Encounters:  09/08/21 90  08/30/21 74  07/21/21 84   Wt Readings from Last 3 Encounters:  08/30/21 300 lb (136.1 kg)  07/21/21 300 lb (136.1 kg)  05/15/21 290 lb (131.5 kg)     Assessment: Review of patient past medical history, allergies, medications, health status, including review of consultants reports, laboratory and other test data, was performed as part of comprehensive evaluation and provision of chronic care management services.   SDOH:  (Social Determinants of Health) assessments and interventions performed:  SDOH Interventions    Flowsheet Row Most Recent Value  SDOH Interventions   Financial Strain Interventions Intervention Not Indicated        CCM Care Plan  No Known Allergies  Medications Reviewed Today     Reviewed by Cherre Robins, RPH-CPP (Pharmacist) on 09/21/21 at 42  Med List Status: <None>   Medication Order Taking? Sig Documenting Provider Last Dose Status Informant  acetaminophen (TYLENOL) 500 MG tablet [id number] Yes Take 500-1,000 mg by mouth every 6 (six) hours as needed for mild pain. [provider] Taking Active Self  Camillo Flaming MEDICATION [id number] Yes Medication Name: [facility] Wheelchair Dierks, [place], [place] Taking Active   amLODipine (NORVASC) 10 MG tablet [id number] Yes Take 1 tablet (10 mg total) by mouth daily. [name], DO Taking Active   COMFORT EZ INSULIN SYRINGE 31G X 5/16" 1 ML MISC [id number] Yes  [provider] Taking Active Self  Cyanocobalamin (VITAMIN B-12 PO) [id number] Yes Take 1 tablet by mouth daily. [provider] Taking Active Self  diclofenac Sodium (VOLTAREN) 1 % GEL [id number] Yes Apply 4 g topically 4 (four) times daily as needed (joint pain). [provider] Taking Active Self  DULoxetine (CYMBALTA) 60 MG capsule [id number] Yes Take 1 capsule by mouth daily. [provider] Taking Active Self  furosemide (LASIX) 40 MG tablet [id number] Yes Take 40 mg by mouth daily. [provider] Taking Active   hydrALAZINE (APRESOLINE) 50 MG tablet [id number] Yes TAKE 1 TABLET(50 MG) BY MOUTH EVERY 8 HOURS Lowne Chase, Alferd Apa, DO  Taking Active   hydrOXYzine (ATARAX) 25 MG tablet [id number] Yes Take 1 tablet (25 mg total) by mouth every 6 (six) hours as needed for anxiety. [name] R, DO Taking Active   ibuprofen (ADVIL) 200 MG tablet [id number] Yes Take 400 mg by mouth every 6 (six) hours as needed for mild pain. [provider] Taking Active Self  immune globulin, human, (GAMMAGARD S/D) 5 g injection [id number] Yes Inject into the vein every 21 ( twenty-one) days. [provider] Taking Active   insulin aspart (NOVOLOG FLEXPEN) 100 UNIT/ML FlexPen [id number] Yes Max daily 80 units per scale [name], Melanie Crazier, MD  Taking Active   insulin degludec (TRESIBA FLEXTOUCH) 100 UNIT/ML FlexTouch Pen [id number] Yes Inject 30 Units into the skin daily. [name], Melanie Crazier, MD Taking Active   Insulin Pen Needle 31G X 5 MM MISC [id number] Yes 1 Device by Does not apply route in the morning, at noon, in the evening, and at bedtime. [name], Melanie Crazier, MD Taking Active   losartan (COZAAR) 100 MG tablet [id number] Yes TAKE ONE TABLET BY MOUTH DAILY [name], DO Taking Active   MAGNESIUM PO [id number] Yes Take 1 tablet by mouth daily. [provider] Taking Active   meclizine (ANTIVERT) 25 MG tablet [id number] Yes Take 1 tablet (25 mg total) by mouth 3 (three) times daily as needed for dizziness. Molpus, John, MD Taking Active            Med Note Thayer Ohm, Debria Garret Apr 20, 2021  2:54 PM)    meloxicam (MOBIC) 15 MG tablet [id number] Yes Take 1 tablet (15 mg total) by mouth daily. Charlett Blake, MD Taking Active   Menthol, Topical Analgesic, (ICY HOT EX) [id number] Yes Apply 1 application topically as needed (pain). [provider] Taking Active Self  metFORMIN (GLUCOPHAGE-XR) 500 MG 24 hr tablet [id number] Yes Take 2 tablets (1,000 mg total) by mouth in the morning and at bedtime. [name], DO Taking Active Self  methocarbamol (ROBAXIN) 500 MG  tablet [id number] Yes Take 500 mg by mouth 4 (four) times daily as needed for muscle spasms. [provider] Taking Active   Multiple Vitamin (MULTIVITAMIN) tablet [id number] Yes Take 1 tablet by mouth daily. [provider] Taking Active Self  NONFORMULARY OR COMPOUNDED ITEM [id number] Yes Power wheelchair [name], [place] Taking Active   NONFORMULARY OR COMPOUNDED ITEM [id number] Yes [facility] wheelchair Carollee Herter, [place] R, [place] Taking Active   polyethylene glycol (MIRALAX / GLYCOLAX) 17 g packet [id number] Yes Take 17 g by mouth daily as needed for mild constipation. Davonna Belling, MD Taking Active   pravastatin (PRAVACHOL) 40 MG tablet [id number] Yes Take 1 tablet (40 mg total) by mouth daily. [name], DO Taking Active Self  sildenafil (VIAGRA) 100 MG tablet [id number] Yes Take 0.5-1 tablets (50-100 mg total) by mouth daily as needed for erectile dysfunction. Carollee Herter, Kendrick Fries R, DO Taking Active   tamsulosin (FLOMAX) 0.4 MG CAPS capsule [id number] Yes TAKE ONE CAPSULE BY MOUTH ONE TIME DAILY [name], DO Taking Active   torsemide (DEMADEX) 20 MG tablet [id number] No 2 po qd  Patient not taking: Reported on 09/21/2021   [name], DO Not Taking Active   traMADol (ULTRAM) 50 MG tablet [id number] Yes 2 po q6 h prn [name], [place] Taking Active             Patient Active Problem List   Diagnosis Date Noted   CIDP (chronic inflammatory demyelinating polyneuropathy) ([place]) 01/12/2021   Diabetes mellitus ([place]) 01/03/2021   Type 2 diabetes mellitus with hyperglycemia, with long-term current use of insulin ([place]) 01/03/2021   Lower extremity edema 11/28/2020   Chronic low back pain 11/28/2020   Psoriatic arthritis ([place]) 11/28/2020   Thoracic aortic aneurysm without rupture 11/28/2020   Bilateral leg weakness 11/05/2020   Acute left-sided low back pain with right-sided sciatica 07/19/2020   Hip pain, acute, left 07/05/2020    Tremor 07/05/2020   Weakness 07/05/2020   Balance problem 03/11/2020   Tinea cruris 03/11/2020  Cellulitis of left groin 03/11/2020   Acute pain of right shoulder 03/11/2020   Interstitial lung disease ([place]) 05/02/2017   Pansinusitis 09/26/2016   Diabetic polyneuropathy associated with diabetes mellitus due to underlying condition ([place]) 05/08/2016   Methamphetamine use disorder, severe, dependence ([place]) 02/11/2016   Substance induced mood disorder ([place]) 02/11/2016   Exertional dyspnea 11/30/2015   ADD (attention deficit disorder) 09/29/2015   Binge eating 09/29/2015   Syncope 05/05/2012   Diarrhea 05/05/2012   Panic attacks 05/05/2012   OSA (obstructive sleep apnea) 05/05/2012   HTN (hypertension) 05/05/2012   Dyslipidemia 05/05/2012   CAD (coronary artery disease)  cardiac cath with moderate disease in a septal branch of the ramus intermedius 04/01/2012   Chest pain 04/01/2012   Drug abuse and dependence ([place]) 04/01/2012   Family history of coronary artery disease 03/31/2012   Sleep apnea, on C-pap 03/31/2012   Hyperlipemia 03/30/2012   HTN (hypertension), benign 03/30/2012   Morbid obesity ([place]) 03/30/2012   DM type 2, uncontrolled, with neuropathy ([place]) 03/30/2012   Depression with suicidal ideation 03/30/2012    Immunization History  Administered Date(s) Administered   Tdap 08/20/2014    Conditions to be addressed/monitored: CAD, HTN, HLD, DMII and chronic pain (low back) interstitial lung disease; OSA  Care Plan : General Pharmacy (Adult)  Updates made by Cherre Robins, RPH-CPP since 09/22/2021 12:00 AM     Problem: Medication management and Chronic Care Management, support and education for the following conditions - HTN, Type2 DM with long term insuiln use; hyperlipidemia; neuropathy; OSA; institial lung disease;   Priority: High  Onset Date: 01/06/2021  Note:   Current Barriers:  Difficulty with affording treatment regimen -improved Unable to independently  monitor therapeutic efficacy - improved Unable to achieve control of type 2 DM and HTN - improved Does not adhere to prescribed medication regimen - improved Frequent ED visits - improved  Pharmacist Clinical Goal(s):  Over the next 45 days, patient will verbalize ability to afford treatment regimen achieve adherence to monitoring guidelines and medication adherence to achieve therapeutic efficacy achieve control of type 2 DM and HTN as evidenced by attainment of goals listed below Decrease frequency of ED visits  through collaboration with PharmD and provider.   Interventions: 1:1 collaboration with Carollee Herter, Alferd Apa, DO regarding development and update of comprehensive plan of care as evidenced by provider attestation and co-signature Inter-disciplinary care team collaboration (see longitudinal plan of care) Comprehensive medication review performed; medication list updated in electronic medical record   Interventions: 1:1 collaboration with Carollee Herter, Alferd Apa, DO regarding development and update of comprehensive plan of care as evidenced by provider attestation and co-signature Inter-disciplinary care team collaboration (see longitudinal plan of care) Comprehensive medication review performed; medication list updated in electronic medical record  Diabetes: Lab Results  Component Value Date   HGBA1C 7.5 (H) 05/18/2021  A1c 03/23/2021 at [facility] was 6.9%  Controlled - (goal A1c <7%) Much improved BG since started using Libre CGM Recent change in insulin per Dr [name] due to elevated post prandial BG Current treatment: Metformin XR 563m - 2 Tablets with Breakfast and 2 tablets with Supper  TTyler AasFlex Pen - inject 30 units once a day Novolog Flex Pen - inject 20 units with breakfast, 24 units with lunch and 24 units with supper.  Use the following correction scale for NovoLog only before meals.    Novolog correctional insulin: ADD extra units on insulin to your  meal-time novolog dose if your blood  sugars are higher than 150:      Blood sugar before meal Number of units to inject  Less than 150 0 unit  151 -  170 1 units  171 -  190 2 units  191 -  210 3 units  211 -  230 4 units  231 -  250 5 units  251 -  270 6 units  271 -  290 7 units  291 -  310 8 units  311 - 330 9 units   Interventions / Recommendations Recommended continue to take medications as directed by Dr [name] Continue to check BG frequently during day with [facility].   Hypertension / Edema:  BP Readings from Last 3 Encounters:  09/08/21 132/82  08/30/21 131/62  07/21/21 (!) 204/105  Recent blood pressure elevate when presented to ED with pain - BP goal <140/90 Not checking BP at home regularly but he has purchased blood pressure cuff recently and will start using Current treatment: Amlodipine 84m daily  Hydralazine 566mevery 8 hours Losartan 10074maily  Torsemide 74m73mily (patient reports he only took a few doses and then restarted furosemide because he was urinating too much) Furosemide 40mg52mly Patient reports edema improved with change to torsemide but he felt that he was urinating too much. He has restarted furosemide but admits it is not as effective as torsemide.  Denies dizziness  Interventions:  Counseled on importance of maintaining blood pressure at goal to prevent strokes and kidney damage Recommended patient retry torsemide at lower dose of 10mg 12my (0.5 tablet of 74mg).33mt patient pill cutter to use. Will transition to 10mg ta58ms if tolerated. He will hold furosemide.  Reminded patient that refill for hydralazine is due. He states that he has ordered and is in process of being filled.    Hyperlipidemia / Heart Disease: Lipid Panel  Lab Results  Component Value Date   CHOL 140 05/18/2021   HDL 56.60 05/18/2021   LDLCALC 63 05/18/2021   LDLDIRECT 124.0 05/02/2017   TRIG 104.0 05/18/2021   CHOLHDL 2 05/18/2021  Controlled - LDL  goal <70 Current treatment: Pravastatin 40mg at 80mime Interventions:  Counseled on LDL goals Recommended continue current regimen for cholesterol  Chronic Pain / Leg Weakness / CIDP: Currently not controlled but improving per patient since started IVIG treatment Recent IVIG treatment for CIDP increased blood pressure. In future will receive treatment over 2 days every 30 days.  Patient reports he was doing better until he fell and fractured his foot.  Current treatment:  Methocarbamol 500mg - ta3m tablet 4 times a day as needed for muscle spasms Voltaren Gel 1% -  apply to area of pain up to 4 times a day as needed Gabapentin 300mg - tak54mtablets = 1200mg 3 time3mday (increased during recent hospitalization) Duloxetine 60mg daily i11mrning IVIG infusion every 30 days over 2 days Patient states that gabapentin was increased to 1200mg 4 times 45my by Dr McGhee but I dMikle Bosworthee report of that change. He states [facility] I checked with [facility] and Rx sent 06/27/21 was for 1200mg 3 times a16m.  Interventions:  Continue current regimen for pain  Encouraged patient to follow up with neurology and pain management Verified [facility]hee regardinMikle Bosworthe should take 3 times a day or 4 times  a day.   Anxiety /  Insomnia:  Uncontrolled Current therapy:   Hydroxyzine 46m take up to every 6 hour as needed  Duloxetine 625mdaily in morning Medication tried in past:  Trazodone - made him feel like a zombie Hydroxyzine - made him sleepy ("like a benadryl on steroids") Ativan - worked well Citalopram - not helpful Clonazepam - made him moody Escitalopram - not helpful Nortriptyline - made him sleep Interventions: (addressed at previous visit) Continue to take duloxetine in morning.  Continue hydroxyzine as needed   Medication Management:  Interventions:   Reviewed  refill history and discussed which medications will have refills due soon.  Will continue to follow adherence and assist patient with refills.   Patient Goals/Self-Care Activities Over the next 90 days, patient will:  take medications as prescribed focus on medication adherence by utilizing pharmacy service that delivers medications to his home, check glucose before meals and 2 hour after meals using CGM, document, and provide at future appointments Check blood pressure 2 to 3 times per week, record for future appointments Restart torsemide at lower dose of 1050maily (0.5 tablet of 11m57mold furosemide while retrying torsemide.   Follow up call planned In 4 to 6 weeks      Medication Assistance: None needed at this time .  [id number] [address] [place] [address] [id number]: [phone]: [phone] [place] -RepublicRegistered Caremark Sites [id number]: [phone]: [phone] Up:  Patient agrees to Care Plan and Follow-up.  Plan: The care management team will reach out to the patient again in 4 weeks.   TammCherre RobinsarmD Clinical Pharmacist Le[facility]anceCN[facility] [place]-[phone]

## 2021-09-22 NOTE — Patient Instructions (Signed)
Mr. Tyler Brewer It was a pleasure speaking with you today.  I have attached a summary of our visit today and information about your health goals.   Our next appointment is by telephone on October 20, 2021 at 2:45pm  Please call the care guide team at [phone] if you need to cancel or reschedule your appointment.    If you have any questions or concerns, please feel free to contact me either at the phone number below or with a MyChart message.   Keep up the good work!  Cherre Robins, PharmD Clinical Pharmacist [facility] [phone] (direct line)  [phone] (main office number)   Chronic Care Management Care Plan (updated 09/22/2021)   Diabetes: Lab Results  Component Value Date   HGBA1C 7.5 (H) 05/18/2021   Not quite at goal but improved from 1 year ago - (goal A1c <7%) Much improved BG since started using Libre and taking insuiln regularly Current treatment: Metformin XR 500mg  - 2 Tablets with Breakfast and 2 tablets with Supper  Tyler Aas Flex Pen - inject 30 units once a day Novolog Flex Pen - inject 20 units with breakfast, 24 units with lunch and 24 units with supper.  Use the following correction scale for NovoLog only before meals.    Novolog correctional insulin: ADD extra units on insulin to your meal-time novolog dose if your blood sugars are higher than 150:      Blood sugar before meal Number of units to inject  Less than 150 0 unit  151 -  170 1 units  171 -  190 2 units  191 -  210 3 units  211 -  230 4 units  231 -  250 5 units  251 -  270 6 units  271 -  290 7 units  291 -  310 8 units  311 - 330 9 units    Interventions / Recommendations Recommended continue to take medications as directed by Dr [name] Continue to check BG frequently during day with [facility]. Interventions / Recommendations Recommended continue to take medications as directed by Dr [name] Continue to check BG frequently during day  with [facility].   Hypertension / swelling : BP Readings from Last 3 Encounters:  09/08/21 132/82  08/30/21 131/62  07/21/21 (!) 204/105   Blood pressure not consistently at goal - BP goal <140/90 Current treatment: Amlodipine 10mg  daily  Hydralazine 50mg  every 8 hours Losartan 100mg  daily  Torsemide 20mg  daily (not taking) Furosemide 40mg  daily (restarted when stopped torsemide) Interventions:  Counseled on importance of getting blood pressure to goal to prevent strokes and kidney damage Start checking blood pressure at home 2 to 3 times per week. Record for future appointments.  Recommended patient retry torsemide at lower dose of 10mg  daily (0.5 tablet of 20mg ). Sent patient pill cutter to use. Will transition to 10mg  tablets if tolerated. Hold furosemide while retrying torsemide Reminded patient that refill for hydralazine is due. He states that he has ordered and is in process of being filled.  Reviewed refill history.  Due to get hydralazine - Good Rx price is around $12 / 30 days    Hyperlipidemia / Heart Disease: Lipid Panel  Lab Results  Component Value Date   CHOL 140 05/18/2021   HDL 56.60 05/18/2021   LDLCALC 63 05/18/2021   LDLDIRECT 124.0 05/02/2017   TRIG 104.0 05/18/2021   CHOLHDL 2 05/18/2021   Controlled - LDL goal <70 Current treatment: Pravastatin 40mg  at  bedtime Interventions:  Counseled on LDL goals Recommended continue current regimen for cholesterol  Chronic Pain / Leg Weakness: Currently not controlled Patient has had several ED visits for pain and weakness in the last month. Has appointment with pain management 02/16/2021. Current treatment:  Tramadol 50mg  - take 1 or 2 tablets up to every 8 hours as needed Methocarbamol 500mg  - take 1 tablet 4 times a day as needed for muscle spasms Voltaren Gel 1% -  apply to area of pain up to 4 times a day as needed Gabapentin 300mg  - take 4 tablets = 1200mg  3 times a day (increased during recent  hospitalization) Duloxetine 60mg  at night Interventions:  Continue current regimen for pain  Encouraged patient to follow up with neurology and pain management Recommended change duloxetine to take in morning to see if change helps with sleep Check with Dr Mikle Bosworth about gabapentin dose.     Medication Management:  Reviewed refill history.  Current Pharmacy: [facility] Interventions:   Will continue to follow adherence and assist patient with refills.     Patient Goals/Self-Care Activities Over the next 90 days, patient will:  take medications as prescribed focus on medication adherence by utilizing pharmacy service that delivers medications to his home, check glucose before meals and 2 hour after meals using CGM, document, and provide at future appointments Check blood pressure 2 to 3 times per week, record for future appointments Restart torsemide at lower dose of 10mg  daily (0.5 tablet of 20mg ) Hold furosemide while retrying torsemide.    Follow up call planned in  4 to 6 weeks  Patient verbalizes understanding of instructions and care plan provided today and agrees to view in [place]. Active MyChart status confirmed with patient.

## 2021-09-24 DIAGNOSIS — E1165 Type 2 diabetes mellitus with hyperglycemia: Secondary | ICD-10-CM | POA: Diagnosis not present

## 2021-09-25 ENCOUNTER — Other Ambulatory Visit: Payer: Self-pay | Admitting: Family Medicine

## 2021-09-25 DIAGNOSIS — E1169 Type 2 diabetes mellitus with other specified complication: Secondary | ICD-10-CM

## 2021-09-25 DIAGNOSIS — E785 Hyperlipidemia, unspecified: Secondary | ICD-10-CM

## 2021-09-25 NOTE — Telephone Encounter (Signed)
Verbal given 

## 2021-09-26 DIAGNOSIS — G6181 Chronic inflammatory demyelinating polyneuritis: Secondary | ICD-10-CM | POA: Diagnosis not present

## 2021-09-27 DIAGNOSIS — G6181 Chronic inflammatory demyelinating polyneuritis: Secondary | ICD-10-CM | POA: Diagnosis not present

## 2021-09-28 ENCOUNTER — Encounter (HOSPITAL_BASED_OUTPATIENT_CLINIC_OR_DEPARTMENT_OTHER): Payer: Self-pay

## 2021-09-28 ENCOUNTER — Emergency Department (HOSPITAL_BASED_OUTPATIENT_CLINIC_OR_DEPARTMENT_OTHER): Payer: Medicare HMO

## 2021-09-28 ENCOUNTER — Observation Stay (HOSPITAL_BASED_OUTPATIENT_CLINIC_OR_DEPARTMENT_OTHER)
Admission: EM | Admit: 2021-09-28 | Discharge: 2021-09-29 | Disposition: A | Discharge status: Home / Self Care | Payer: Medicare HMO | Attending: Internal Medicine | Admitting: Internal Medicine

## 2021-09-28 ENCOUNTER — Other Ambulatory Visit: Payer: Self-pay

## 2021-09-28 DIAGNOSIS — I110 Hypertensive heart disease with heart failure: Secondary | ICD-10-CM | POA: Insufficient documentation

## 2021-09-28 DIAGNOSIS — E1159 Type 2 diabetes mellitus with other circulatory complications: Secondary | ICD-10-CM

## 2021-09-28 DIAGNOSIS — R0602 Shortness of breath: Secondary | ICD-10-CM | POA: Insufficient documentation

## 2021-09-28 DIAGNOSIS — Z743 Need for continuous supervision: Secondary | ICD-10-CM | POA: Diagnosis not present

## 2021-09-28 DIAGNOSIS — I2510 Atherosclerotic heart disease of native coronary artery without angina pectoris: Secondary | ICD-10-CM | POA: Diagnosis not present

## 2021-09-28 DIAGNOSIS — R1013 Epigastric pain: Secondary | ICD-10-CM | POA: Diagnosis not present

## 2021-09-28 DIAGNOSIS — R0609 Other forms of dyspnea: Secondary | ICD-10-CM

## 2021-09-28 DIAGNOSIS — R0789 Other chest pain: Secondary | ICD-10-CM | POA: Diagnosis not present

## 2021-09-28 DIAGNOSIS — E1165 Type 2 diabetes mellitus with hyperglycemia: Secondary | ICD-10-CM | POA: Insufficient documentation

## 2021-09-28 DIAGNOSIS — M21372 Foot drop, left foot: Secondary | ICD-10-CM | POA: Insufficient documentation

## 2021-09-28 DIAGNOSIS — Z20822 Contact with and (suspected) exposure to covid-19: Secondary | ICD-10-CM | POA: Diagnosis not present

## 2021-09-28 DIAGNOSIS — R1012 Left upper quadrant pain: Secondary | ICD-10-CM | POA: Diagnosis not present

## 2021-09-28 DIAGNOSIS — I5032 Chronic diastolic (congestive) heart failure: Secondary | ICD-10-CM | POA: Diagnosis not present

## 2021-09-28 DIAGNOSIS — R600 Localized edema: Secondary | ICD-10-CM | POA: Insufficient documentation

## 2021-09-28 DIAGNOSIS — R109 Unspecified abdominal pain: Secondary | ICD-10-CM | POA: Diagnosis not present

## 2021-09-28 DIAGNOSIS — I25119 Atherosclerotic heart disease of native coronary artery with unspecified angina pectoris: Secondary | ICD-10-CM

## 2021-09-28 DIAGNOSIS — Z87891 Personal history of nicotine dependence: Secondary | ICD-10-CM | POA: Diagnosis not present

## 2021-09-28 DIAGNOSIS — R059 Cough, unspecified: Secondary | ICD-10-CM | POA: Diagnosis not present

## 2021-09-28 DIAGNOSIS — Z7984 Long term (current) use of oral hypoglycemic drugs: Secondary | ICD-10-CM | POA: Diagnosis not present

## 2021-09-28 DIAGNOSIS — R079 Chest pain, unspecified: Secondary | ICD-10-CM | POA: Diagnosis not present

## 2021-09-28 DIAGNOSIS — Z79899 Other long term (current) drug therapy: Secondary | ICD-10-CM | POA: Insufficient documentation

## 2021-09-28 DIAGNOSIS — G6181 Chronic inflammatory demyelinating polyneuritis: Secondary | ICD-10-CM | POA: Diagnosis not present

## 2021-09-28 DIAGNOSIS — N400 Enlarged prostate without lower urinary tract symptoms: Secondary | ICD-10-CM | POA: Diagnosis not present

## 2021-09-28 DIAGNOSIS — Z794 Long term (current) use of insulin: Secondary | ICD-10-CM | POA: Diagnosis not present

## 2021-09-28 DIAGNOSIS — N200 Calculus of kidney: Secondary | ICD-10-CM | POA: Diagnosis not present

## 2021-09-28 DIAGNOSIS — R7989 Other specified abnormal findings of blood chemistry: Secondary | ICD-10-CM

## 2021-09-28 DIAGNOSIS — I10 Essential (primary) hypertension: Secondary | ICD-10-CM | POA: Diagnosis not present

## 2021-09-28 DIAGNOSIS — R778 Other specified abnormalities of plasma proteins: Secondary | ICD-10-CM | POA: Diagnosis not present

## 2021-09-28 DIAGNOSIS — R0600 Dyspnea, unspecified: Secondary | ICD-10-CM | POA: Diagnosis not present

## 2021-09-28 LAB — BASIC METABOLIC PANEL
Anion gap: 9 (ref 5–15)
BUN: 21 mg/dL — ABNORMAL HIGH (ref 6–20)
CO2: 23 mmol/L (ref 22–32)
Calcium: 8.9 mg/dL (ref 8.9–10.3)
Chloride: 100 mmol/L (ref 98–111)
Creatinine, Ser: 0.79 mg/dL (ref 0.61–1.24)
GFR, Estimated: >60 mL/min (ref >60)
Glucose, Bld: 239 mg/dL — ABNORMAL HIGH (ref 70–99)
Potassium: 4.2 mmol/L (ref 3.5–5.1)
Sodium: 132 mmol/L — ABNORMAL LOW (ref 135–145)

## 2021-09-28 LAB — RESP PANEL BY RT-PCR (FLU A&B, COVID) ARPGX2
Influenza A by PCR: NEGATIVE
Influenza B by PCR: NEGATIVE
SARS Coronavirus 2 by RT PCR: NEGATIVE

## 2021-09-28 LAB — CBC WITH DIFFERENTIAL/PLATELET
Abs Immature Granulocytes: 0.02 10*3/uL (ref 0.00–0.07)
Basophils Absolute: 0.1 10*3/uL (ref 0.0–0.1)
Basophils Relative: 1 %
Eosinophils Absolute: 0.2 10*3/uL (ref 0.0–0.5)
Eosinophils Relative: 5 %
HCT: 40.6 % (ref 39.0–52.0)
Hemoglobin: 13.8 g/dL (ref 13.0–17.0)
Immature Granulocytes: 1 %
Lymphocytes Relative: 26 %
Lymphs Abs: 1.1 10*3/uL (ref 0.7–4.0)
MCH: 29.4 pg (ref 26.0–34.0)
MCHC: 34 g/dL (ref 30.0–36.0)
MCV: 86.4 fL (ref 80.0–100.0)
Monocytes Absolute: 0.5 10*3/uL (ref 0.1–1.0)
Monocytes Relative: 11 %
Neutro Abs: 2.5 10*3/uL (ref 1.7–7.7)
Neutrophils Relative %: 56 %
Platelets: 269 10*3/uL (ref 150–400)
RBC: 4.7 MIL/uL (ref 4.22–5.81)
RDW: 12.9 % (ref 11.5–15.5)
WBC: 4.4 10*3/uL (ref 4.0–10.5)
nRBC: 0 % (ref 0.0–0.2)

## 2021-09-28 LAB — GLUCOSE, CAPILLARY
Glucose-Capillary: 212 mg/dL — ABNORMAL HIGH (ref 70–99)
Glucose-Capillary: 301 mg/dL — ABNORMAL HIGH (ref 70–99)

## 2021-09-28 LAB — BRAIN NATRIURETIC PEPTIDE: B Natriuretic Peptide: 18.6 pg/mL (ref 0.0–100.0)

## 2021-09-28 LAB — TROPONIN I (HIGH SENSITIVITY)
Troponin I (High Sensitivity): 128 ng/L (ref <18)
Troponin I (High Sensitivity): 134 ng/L (ref <18)
Troponin I (High Sensitivity): 139 ng/L (ref <18)
Troponin I (High Sensitivity): 134 ng/L (ref <18)

## 2021-09-28 IMAGING — CT CT ANGIO CHEST-ABD-PELV FOR DISSECTION W/ AND WO/W CM
2 of 11 series · 14 of 46 positions shown, 16 images · IV contrast (Omnipaque)
Comparison: [DATE].

CLINICAL DATA: Chest pain.  Shortness of breath.

EXAM:
CT ANGIOGRAPHY CHEST, ABDOMEN AND PELVIS
TECHNIQUE: Non-contrast CT of the chest was initially obtained.

[Series 8: axial arterial · axial · arterial · 0.98mm/px · z∈[-778,-73]mm · 11 of 263 slices shown, 13 images]
[im 14/263  soft-tissue]
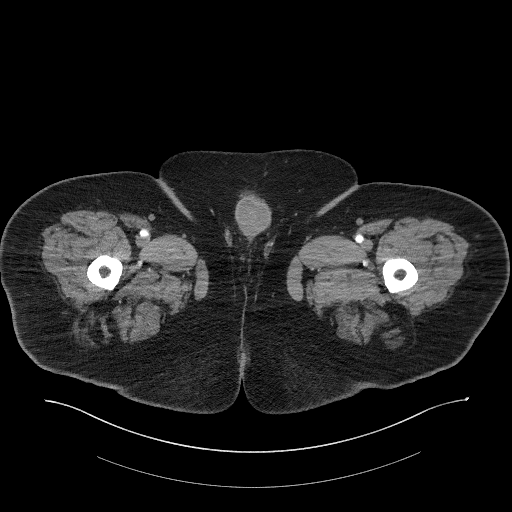
[im 14/263  bone]
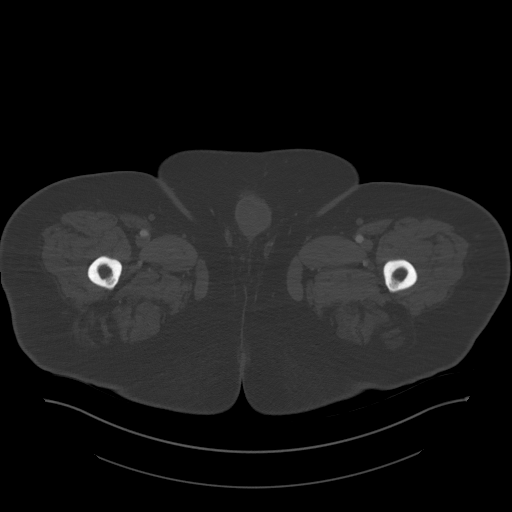
[im 40/263  soft-tissue]
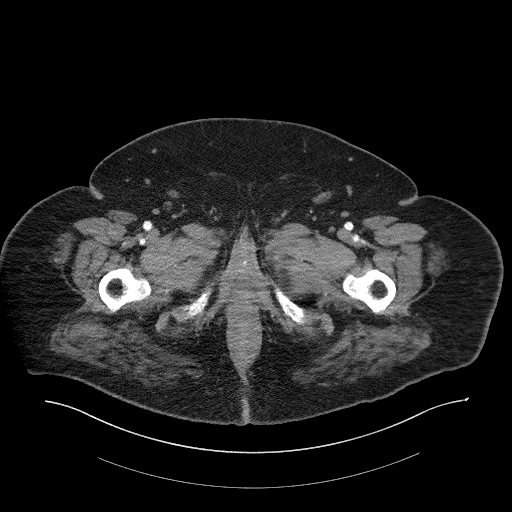
[im 66/263  soft-tissue]
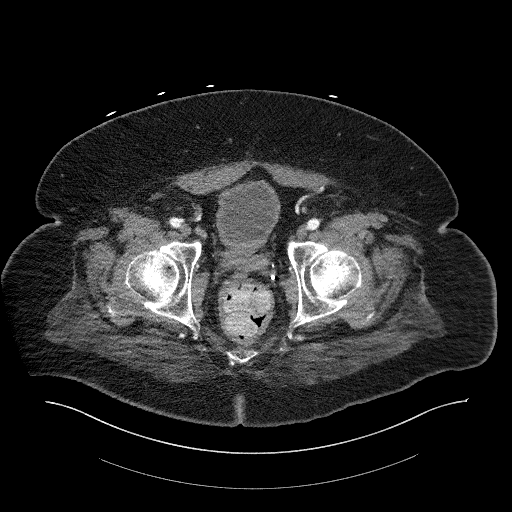
[im 92/263  soft-tissue]
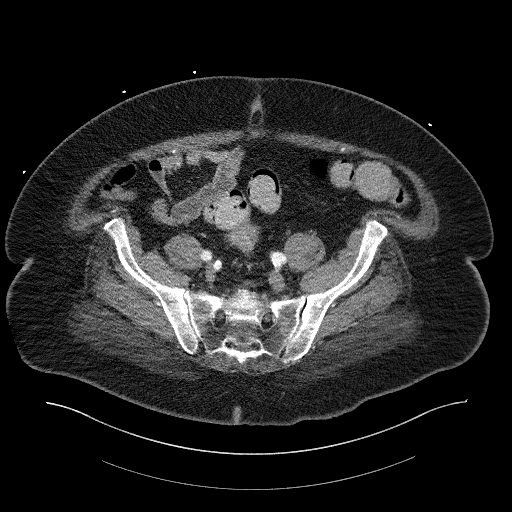
[im 105/263  soft-tissue]
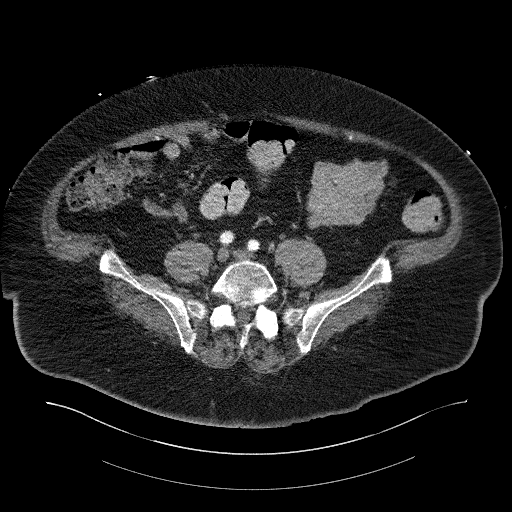
[im 132/263  soft-tissue]
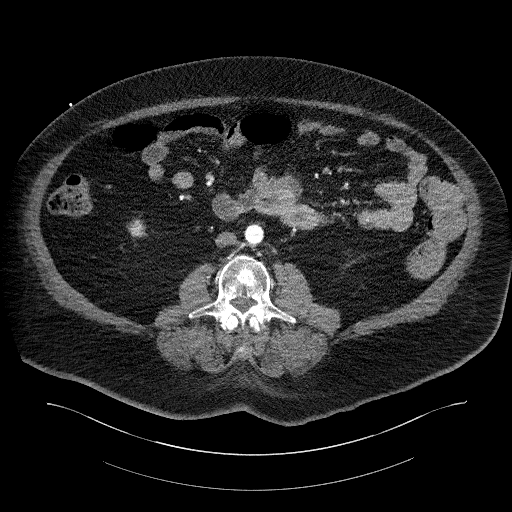
[im 158/263  soft-tissue]
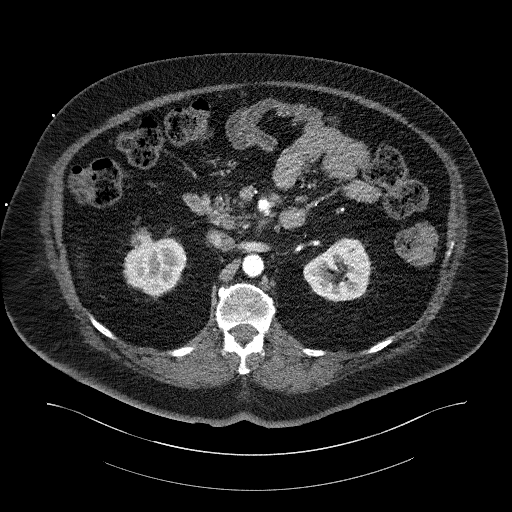
[im 171/263  soft-tissue]
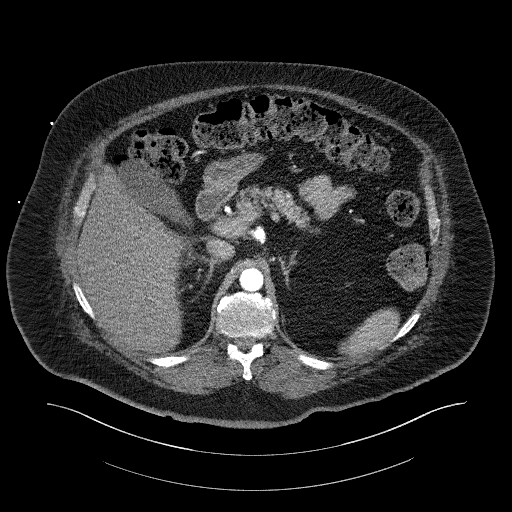
[im 197/263  soft-tissue]
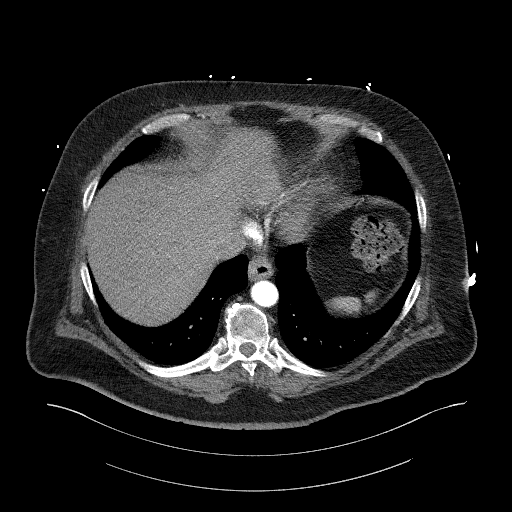
[im 197/263  bone]
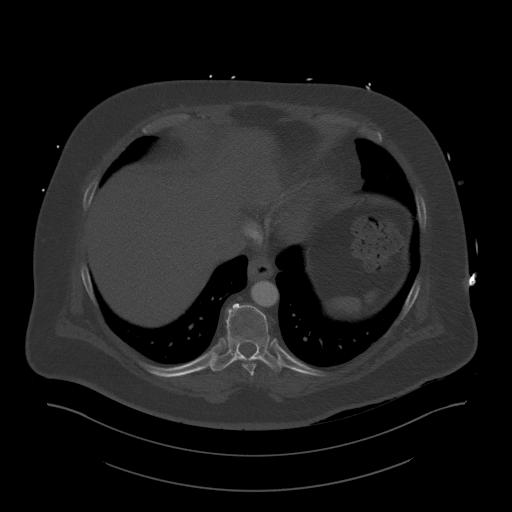
[im 223/263  soft-tissue]
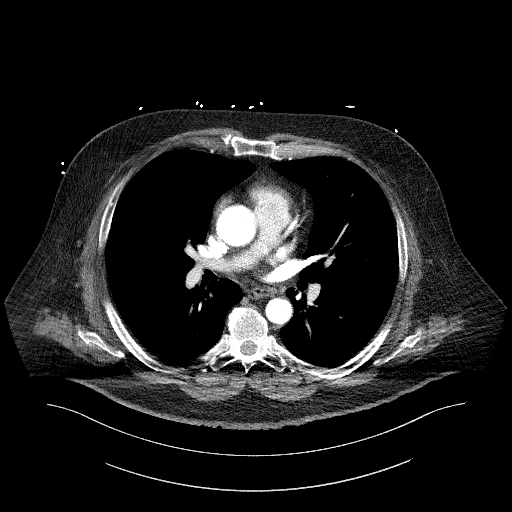
[im 249/263  soft-tissue]
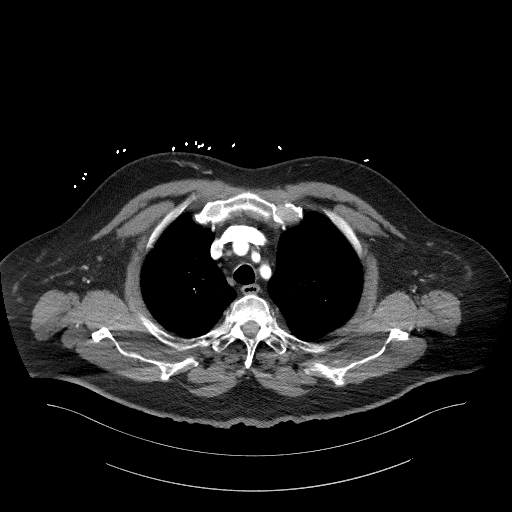

[Series 11: coronals · coronal · 1.05mm/px · 3 of 197 slices shown]
[im 50/197  soft-tissue]
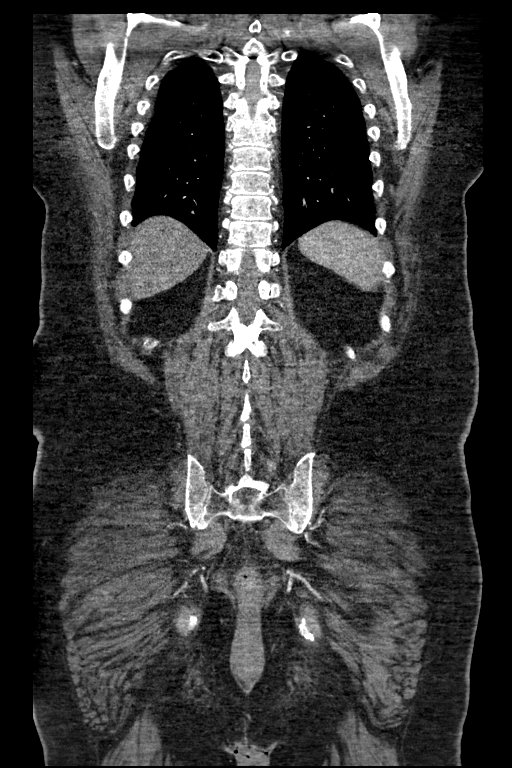
[im 99/197  soft-tissue]
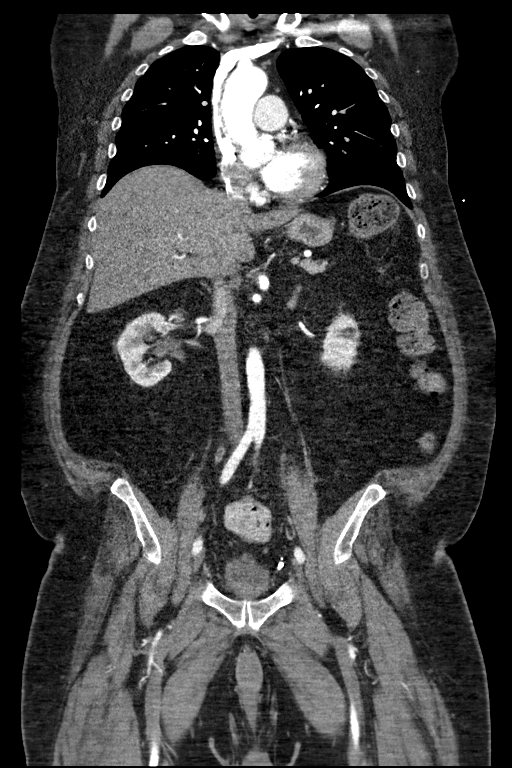
[im 148/197  soft-tissue]
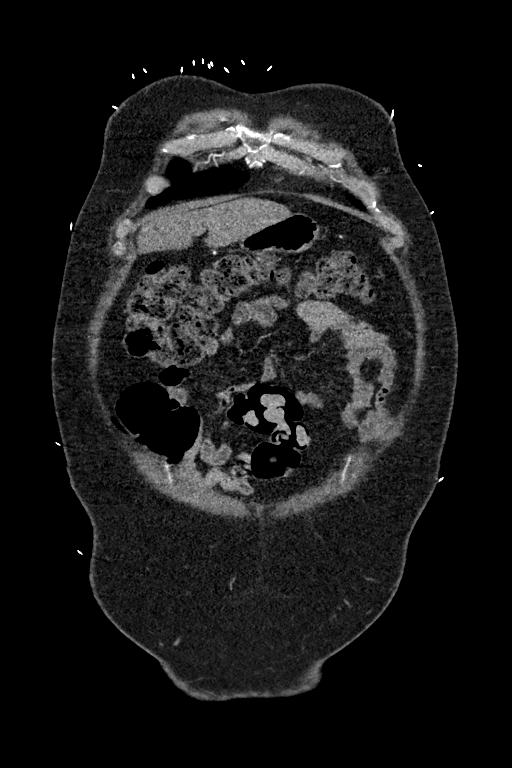

[14 of 46 positions shown; findings below may reference images not displayed]

Multidetector CT imaging through the chest, abdomen and pelvis was
performed using the standard protocol during bolus administration of
intravenous contrast. Multiplanar reconstructed images and MIPs were
obtained and reviewed to evaluate the vascular anatomy.

RADIATION DOSE REDUCTION: This exam was performed according to the
departmental dose-optimization program which includes automated
exposure control, adjustment of the mA and/or kV according to
patient size and/or use of iterative reconstruction technique.

CONTRAST:  125mL OMNIPAQUE IOHEXOL 350 MG/ML SOLN
FINDINGS: CTA CHEST FINDINGS

Cardiovascular: Preferential opacification of the thoracic aorta. No
evidence of thoracic aortic aneurysm or dissection. Normal heart
size. No pericardial effusion.

Mediastinum/Nodes: No enlarged mediastinal, hilar, or axillary lymph
nodes. Thyroid gland, trachea, and esophagus demonstrate no
significant findings.

Lungs/Pleura: Lungs are clear. No pleural effusion or pneumothorax.

Musculoskeletal: No chest wall abnormality. No acute or significant
osseous findings.

Review of the MIP images confirms the above findings.

CTA ABDOMEN AND PELVIS FINDINGS

VASCULAR

Aorta: Normal caliber aorta without aneurysm, dissection, vasculitis
or significant stenosis.

Celiac: Patent without evidence of aneurysm, dissection, vasculitis
or significant stenosis.

SMA: Patent without evidence of aneurysm, dissection, vasculitis or
significant stenosis.

Renals: Both renal arteries are patent without evidence of aneurysm,
dissection, vasculitis, fibromuscular dysplasia or significant
stenosis.

IMA: Patent without evidence of aneurysm, dissection, vasculitis or
significant stenosis.

Inflow: Patent without evidence of aneurysm, dissection, vasculitis
or significant stenosis.

Veins: No obvious venous abnormality within the limitations of this
arterial phase study.

Review of the MIP images confirms the above findings.

NON-VASCULAR

Hepatobiliary: No focal liver abnormality is seen. No gallstones,
gallbladder wall thickening, or biliary dilatation.

Pancreas: Unremarkable. No pancreatic ductal dilatation or
surrounding inflammatory changes.

Spleen: Normal in size without focal abnormality.

Adrenals/Urinary Tract: Adrenal glands are unremarkable. Small
nonobstructive left renal calculus is noted. No hydronephrosis or
renal obstruction is noted. Bladder is unremarkable.

Stomach/Bowel: Stomach is within normal limits. Appendix appears
normal. No evidence of bowel wall thickening, distention, or
inflammatory changes.

Lymphatic: No adenopathy is noted.

Reproductive: Prostate is unremarkable.

Other: No abdominal wall hernia or abnormality. No abdominopelvic
ascites.

Musculoskeletal: No fracture is seen. Stable L3 sclerotic density is
noted.

Review of the MIP images confirms the above findings.
IMPRESSION: No evidence of thoracic or abdominal aortic dissection or aneurysm.

Small nonobstructive left renal calculus. No hydronephrosis or renal
obstruction is noted.

## 2021-09-28 IMAGING — DX DG CHEST 1V PORT
1 series · 1 of 1 positions shown · non-contrast
Comparison: [DATE]

CLINICAL DATA: Shortness of breath.  Chest pain.

EXAM:
PORTABLE CHEST 1 VIEW

[chest ap]
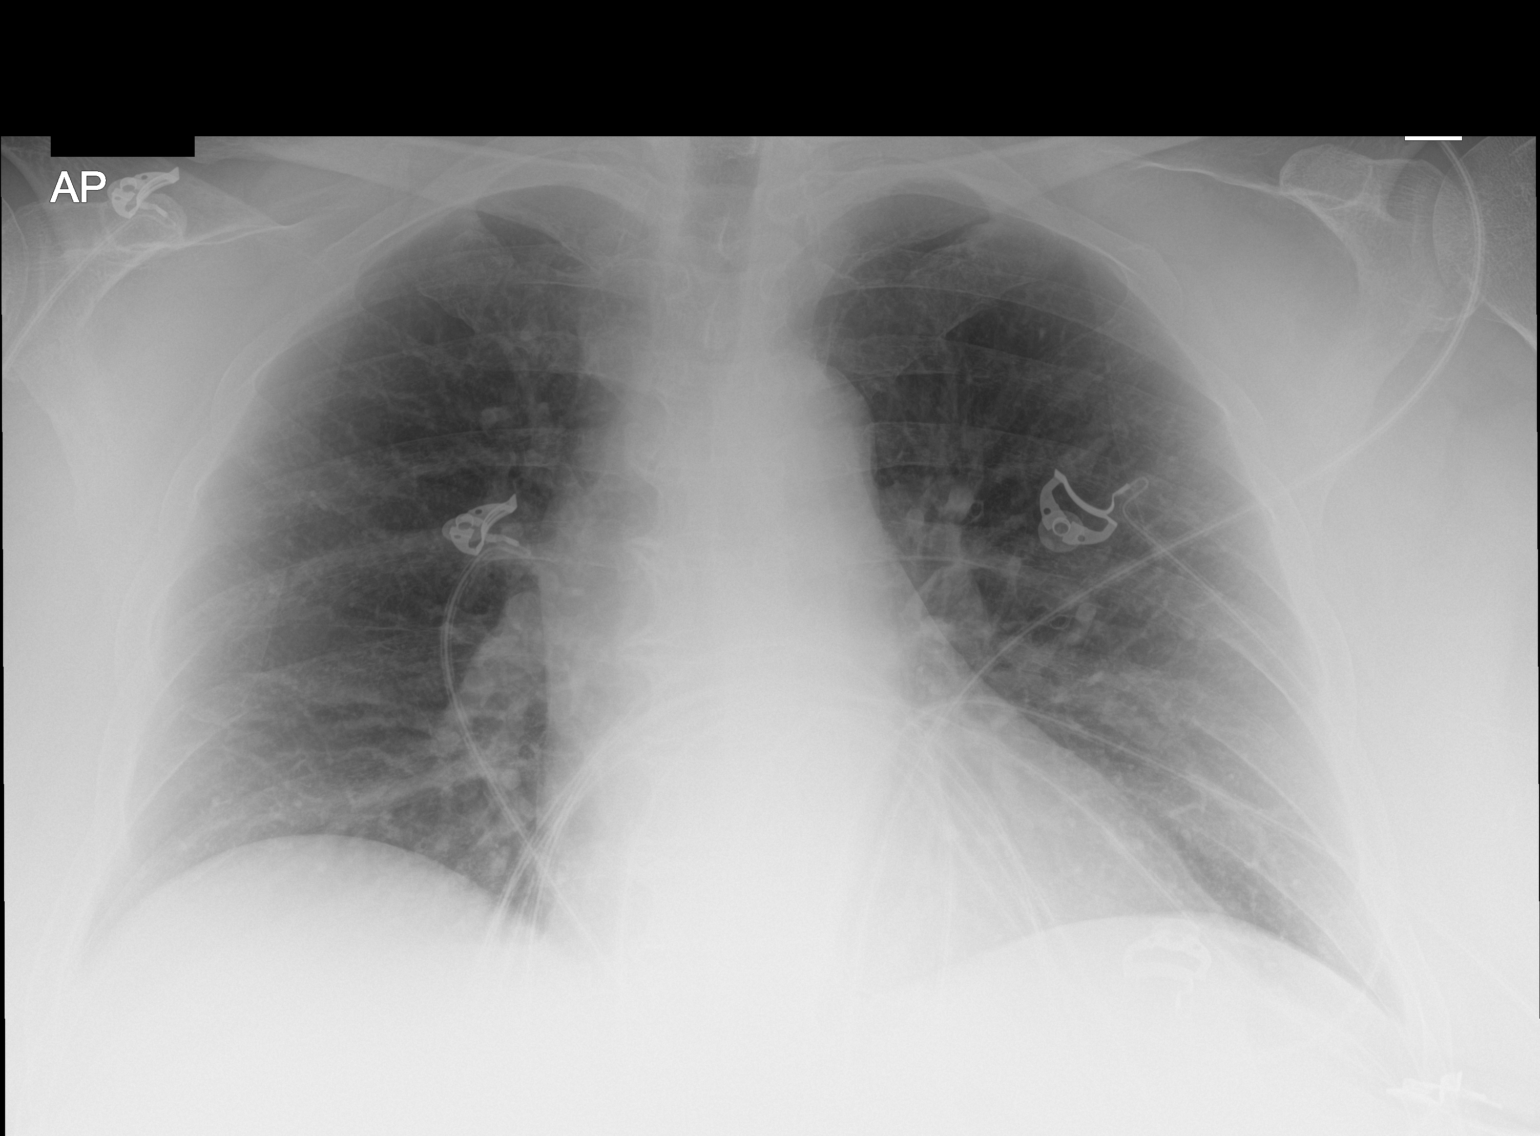

[1 of 1 positions shown; findings below may reference images not displayed]

FINDINGS: Cardiac silhouette is at the upper limits of size for AP technique.
Mediastinal contours are within normal limits. The lungs are clear.
No pleural effusion or pneumothorax. Moderate multilevel
degenerative disc changes of the thoracic spine.
IMPRESSION: No active disease.

## 2021-09-28 MED ORDER — ENOXAPARIN SODIUM 80 MG/0.8ML IJ SOSY
70.0000 mg | PREFILLED_SYRINGE | Freq: Every day | INTRAMUSCULAR | Status: DC
  Administered 2021-09-29: 70 mg via SUBCUTANEOUS
  Filled 2021-09-28: qty 0.8

## 2021-09-28 MED ORDER — NITROGLYCERIN 0.4 MG SL SUBL
0.4000 mg | SUBLINGUAL_TABLET | SUBLINGUAL | Status: DC | PRN
  Administered 2021-09-28: 0.4 mg via SUBLINGUAL
  Filled 2021-09-28: qty 1

## 2021-09-28 MED ORDER — PRAVASTATIN SODIUM 40 MG PO TABS
40.0000 mg | ORAL_TABLET | Freq: Every day | ORAL | Status: DC
  Filled 2021-09-28: qty 1

## 2021-09-28 MED ORDER — TRAMADOL HCL 50 MG PO TABS
50.0000 mg | ORAL_TABLET | Freq: Four times a day (QID) | ORAL | Status: DC | PRN
  Administered 2021-09-28 – 2021-09-29 (×2): 50 mg via ORAL
  Filled 2021-09-28 (×2): qty 1

## 2021-09-28 MED ORDER — METHOCARBAMOL 500 MG PO TABS
500.0000 mg | ORAL_TABLET | Freq: Once | ORAL | Status: AC
  Administered 2021-09-28: 500 mg via ORAL
  Filled 2021-09-28: qty 1

## 2021-09-28 MED ORDER — GABAPENTIN 400 MG PO CAPS
1200.0000 mg | ORAL_CAPSULE | Freq: Three times a day (TID) | ORAL | Status: DC
  Administered 2021-09-28 – 2021-09-29 (×2): 1200 mg via ORAL
  Filled 2021-09-28 (×2): qty 3

## 2021-09-28 MED ORDER — INSULIN GLARGINE-YFGN 100 UNIT/ML ~~LOC~~ SOLN
30.0000 [IU] | Freq: Every day | SUBCUTANEOUS | Status: DC
  Administered 2021-09-29: 30 [IU] via SUBCUTANEOUS
  Filled 2021-09-28: qty 0.3

## 2021-09-28 MED ORDER — INSULIN ASPART 100 UNIT/ML IJ SOLN
0.0000 [IU] | Freq: Three times a day (TID) | INTRAMUSCULAR | Status: DC
  Administered 2021-09-29 (×2): 5 [IU] via SUBCUTANEOUS

## 2021-09-28 MED ORDER — INSULIN DEGLUDEC 100 UNIT/ML ~~LOC~~ SOPN
30.0000 [IU] | PEN_INJECTOR | Freq: Every day | SUBCUTANEOUS | Status: DC
Start: 2021-09-29 — End: 2021-09-28

## 2021-09-28 MED ORDER — TRAMADOL HCL 50 MG PO TABS
100.0000 mg | ORAL_TABLET | Freq: Four times a day (QID) | ORAL | Status: DC | PRN
  Filled 2021-09-28: qty 2

## 2021-09-28 MED ORDER — TORSEMIDE 20 MG PO TABS: 10.0000 mg | ORAL_TABLET | Freq: Two times a day (BID) | ORAL | Status: DC

## 2021-09-28 MED ORDER — METHOCARBAMOL 500 MG PO TABS
500.0000 mg | ORAL_TABLET | Freq: Four times a day (QID) | ORAL | Status: DC | PRN
  Administered 2021-09-28 – 2021-09-29 (×2): 500 mg via ORAL
  Filled 2021-09-28 (×2): qty 1

## 2021-09-28 MED ORDER — ACETAMINOPHEN 325 MG PO TABS: 650.0000 mg | ORAL_TABLET | Freq: Four times a day (QID) | ORAL | Status: DC | PRN

## 2021-09-28 MED ORDER — ACETAMINOPHEN 650 MG RE SUPP: 650.0000 mg | Freq: Four times a day (QID) | RECTAL | Status: DC | PRN

## 2021-09-28 MED ORDER — INSULIN ASPART 100 UNIT/ML IJ SOLN
11.0000 [IU] | Freq: Once | INTRAMUSCULAR | Status: AC
Start: 2021-09-29 — End: 2021-09-28
  Administered 2021-09-28: 11 [IU] via SUBCUTANEOUS

## 2021-09-28 MED ORDER — ASPIRIN 325 MG PO TABS
325.0000 mg | ORAL_TABLET | Freq: Every day | ORAL | Status: DC
  Administered 2021-09-29: 325 mg via ORAL
  Filled 2021-09-28: qty 1

## 2021-09-28 MED ORDER — AMLODIPINE BESYLATE 10 MG PO TABS
10.0000 mg | ORAL_TABLET | Freq: Every day | ORAL | Status: DC
  Administered 2021-09-29: 10 mg via ORAL
  Filled 2021-09-28: qty 1

## 2021-09-28 MED ORDER — POLYETHYLENE GLYCOL 3350 17 G PO PACK: 17.0000 g | PACK | Freq: Every day | ORAL | Status: DC | PRN

## 2021-09-28 MED ORDER — IOHEXOL 350 MG/ML SOLN
125.0000 mL | Freq: Once | INTRAVENOUS | Status: AC | PRN
  Administered 2021-09-28: 125 mL via INTRAVENOUS

## 2021-09-28 MED ORDER — MELOXICAM 7.5 MG PO TABS
15.0000 mg | ORAL_TABLET | Freq: Every day | ORAL | Status: DC
  Administered 2021-09-29: 15 mg via ORAL
  Filled 2021-09-28: qty 2

## 2021-09-28 MED ORDER — DULOXETINE HCL 60 MG PO CPEP
60.0000 mg | ORAL_CAPSULE | Freq: Every day | ORAL | Status: DC
  Administered 2021-09-29: 60 mg via ORAL
  Filled 2021-09-28: qty 1

## 2021-09-28 MED ORDER — LOSARTAN POTASSIUM 50 MG PO TABS
100.0000 mg | ORAL_TABLET | Freq: Every day | ORAL | Status: DC
  Administered 2021-09-29: 100 mg via ORAL
  Filled 2021-09-28: qty 2

## 2021-09-28 MED ORDER — GABAPENTIN 300 MG PO CAPS
1200.0000 mg | ORAL_CAPSULE | Freq: Once | ORAL | Status: AC
  Administered 2021-09-28: 1200 mg via ORAL
  Filled 2021-09-28: qty 4

## 2021-09-28 MED ORDER — LIDOCAINE 5 % EX PTCH
1.0000 | MEDICATED_PATCH | CUTANEOUS | Status: DC
  Administered 2021-09-28: 1 via TRANSDERMAL
  Filled 2021-09-28: qty 1

## 2021-09-28 MED ORDER — ONDANSETRON HCL 4 MG PO TABS: 4.0000 mg | ORAL_TABLET | Freq: Four times a day (QID) | ORAL | Status: DC | PRN

## 2021-09-28 MED ORDER — HYDROXYZINE HCL 25 MG PO TABS
25.0000 mg | ORAL_TABLET | Freq: Four times a day (QID) | ORAL | Status: DC | PRN
  Administered 2021-09-29: 25 mg via ORAL
  Filled 2021-09-28: qty 1

## 2021-09-28 MED ORDER — TAMSULOSIN HCL 0.4 MG PO CAPS
0.4000 mg | ORAL_CAPSULE | Freq: Every day | ORAL | Status: DC
  Administered 2021-09-29: 0.4 mg via ORAL
  Filled 2021-09-28: qty 1

## 2021-09-28 MED ORDER — HYDRALAZINE HCL 50 MG PO TABS
50.0000 mg | ORAL_TABLET | Freq: Three times a day (TID) | ORAL | Status: DC
  Administered 2021-09-28 – 2021-09-29 (×3): 50 mg via ORAL
  Filled 2021-09-28 (×3): qty 1

## 2021-09-28 MED ORDER — TRAMADOL HCL 50 MG PO TABS
50.0000 mg | ORAL_TABLET | Freq: Once | ORAL | Status: AC
  Administered 2021-09-28: 50 mg via ORAL
  Filled 2021-09-28: qty 1

## 2021-09-28 MED ORDER — ONDANSETRON HCL 4 MG/2ML IJ SOLN: 4.0000 mg | Freq: Four times a day (QID) | INTRAMUSCULAR | Status: DC | PRN

## 2021-09-28 NOTE — ED Notes (Signed)
[facility] at bedside. Pt stable for transport. 

## 2021-09-28 NOTE — Assessment & Plan Note (Addendum)
·   Notably elevated troponins however serial cardiac enzymes are found to have flat trajectory of elevation  No evidence of pericarditis on EKG however will obtain echocardiogram  N.p.o. 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 Dr. Stanford Breed has evaluated the patient and his echocardiogram. He has cleared the patient for discharge.   The patient will be scheduled for Cardiac CTA as outpatient. He will follow up with cardiology.  The patient is being discharged on asa EC 81 mg daily and NTG.

## 2021-09-28 NOTE — Assessment & Plan Note (Addendum)
•   Please see assessment and plan above  Monitoring patient on telemetry  Continue home regimen of  lipid lowering therapy  Cardiology was consulted. Dr. Stanford Breed has evaluated the patient and his echocardiogram. He has cleared the patient for discharge.   The patient will be scheduled for Cardiac CTA as outpatient. He will follow up with cardiology.  The patient is being discharged on asa EC 81 mg daily and NTG.

## 2021-09-28 NOTE — Assessment & Plan Note (Signed)
·   Continue home regimen of Flovent  Obtaining postvoid residual bladder scan

## 2021-09-28 NOTE — Assessment & Plan Note (Addendum)
•   Echocardiogram demonstrated EF 55-60% with normal LV function. Diastolic parameters are indeterminate. There is LVH. No wall motion abnormalities.

## 2021-09-28 NOTE — Assessment & Plan Note (Addendum)
·   2 to 3-week history of waxing and waning severe left flank, epigastric and left-sided chest discomfort  Etiology unclear  CT of chest abdomen pelvis reveals no evidence of pulmonary embolism, pneumonia, left kidney hydronephrosis or obstructing stone or pyelonephritis  Patient found to have somewhat elevated troponin however with flat trajectory of serial enzymes bringing up questions as to their clinical significance  Evaluating further with urinalysis, lipase, inflammatory markers, echocardiogram, bladder scan  Monitoring patient on telemetry  Left leg Lidoderm patch for pain control with continuation of home regimen of as needed Ultram Chronic pain. Likely musculoskeletal in etiology.

## 2021-09-28 NOTE — ED Notes (Signed)
Pt in bed, pt reports some shortness of breath, pt has increased sob with exertion, pt talking in full sentences, pt on cardiac and O2 sat monitor, pt has plus 2 edema in lower extremities.

## 2021-09-28 NOTE — ED Triage Notes (Signed)
Pt to er room number 11 via ems, per ems pt is here for chest pain and sob.  Pt states that he has been having sob for the past few days, but is worse today, pt talking in full sentences.

## 2021-09-28 NOTE — Assessment & Plan Note (Signed)
•   Patient been placed on Accu-Cheks before every meal and nightly with sliding scale insulin  Continue home regimen of basal insulin therapy  Holding home regimen of metformin  Hemoglobin A1C ordered  Diabetic Diet

## 2021-09-28 NOTE — ED Provider Notes (Signed)
[facility] Provider Note   CSN: [id number] Arrival date & time: 09/28/21  1158     History Chief Complaint  Patient presents with   Shortness of Breath    Tyler Brewer is a 61 y.o. male with h/o CAD with cath in 2013, CIPD, DM, HLD, HTN, OSA, morbid obesity presents emergency department for evaluation of shortness of breath that is worse with exertion and left lower chest pain that radiates to the back ongoing for the past 2 to 3 weeks.  Patient reports he has a history of an aortic arch aneurysm in 2018 but was told about this in 2022.  He sees Dr. Kipp Brood who is a cardiologist (?).  He reports he has been having some lightheadedness as well.  He reports the left upper quadrant/left lower chest pain is constant but waxes and wanes.  He denies any nausea or vomiting.  He does report some leg swelling.  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  He reports he has a history of constipation, but had several bowel movements that were large in amount over the past few days and does not think this pain is from constipation like it has been in the past.  Ambulatory with walker at home.  Patient denies any drug allergies.   Shortness of Breath Associated symptoms: abdominal pain and chest pain   Associated symptoms: no fever, no headaches, no neck pain and no vomiting       Home Medications Prior to Admission medications   Medication Sig Start Date End Date Taking? Authorizing Provider  acetaminophen (TYLENOL) 500 MG tablet Take 500-1,000 mg by mouth every 6 (six) hours as needed for mild pain.    [provider]  AMBULATORY NON FORMULARY MEDICATION Medication Name: [facility] Wheelchair 07/31/21   Carollee Herter, Alferd Apa, DO  amLODipine (NORVASC) 10 MG tablet Take 1 tablet (10 mg total) by mouth daily. 08/24/21   [name], DO  COMFORT EZ INSULIN SYRINGE 31G X 5/16" 1 ML MISC  12/15/20   [provider]  Cyanocobalamin  (VITAMIN B-12 PO) Take 1 tablet by mouth daily.    [provider]  diclofenac Sodium (VOLTAREN) 1 % GEL Apply 4 g topically 4 (four) times daily as needed (joint pain).    [provider]  DULoxetine (CYMBALTA) 60 MG capsule Take 1 capsule by mouth daily. 05/06/21   [provider]  hydrALAZINE (APRESOLINE) 50 MG tablet TAKE 1 TABLET(50 MG) BY MOUTH EVERY 8 HOURS 08/01/21   Carollee Herter, Alferd Apa, DO  hydrOXYzine (ATARAX) 25 MG tablet Take 1 tablet (25 mg total) by mouth every 6 (six) hours as needed for anxiety. 08/09/21   [name], DO  ibuprofen (ADVIL) 200 MG tablet Take 400 mg by mouth every 6 (six) hours as needed for mild pain.    [provider]  immune globulin, human, (GAMMAGARD S/D) 5 g injection Inject into the vein every 21 ( twenty-one) days.    [provider]  insulin aspart (NOVOLOG FLEXPEN) 100 UNIT/ML FlexPen Max daily 80 units per scale 08/24/21   [name], Melanie Crazier, MD  insulin degludec (TRESIBA FLEXTOUCH) 100 UNIT/ML FlexTouch Pen Inject 30 Units into the skin daily. 08/24/21   [name], Melanie Crazier, MD  Insulin Pen Needle 31G X 5 MM MISC 1 Device by Does not apply route in the morning, at noon, in the evening, and at bedtime. 08/24/21   [name], Mammie Lorenzo  Amedeo Kinsman, MD  losartan (COZAAR) 100 MG tablet TAKE ONE TABLET BY MOUTH DAILY 04/18/21   Carollee Herter, Alferd Apa, DO  MAGNESIUM PO Take 1 tablet by mouth daily.    [provider]  meclizine (ANTIVERT) 25 MG tablet Take 1 tablet (25 mg total) by mouth 3 (three) times daily as needed for dizziness. 02/18/21   Molpus, John, MD  meloxicam (MOBIC) 15 MG tablet Take 1 tablet (15 mg total) by mouth daily. 03/14/21   Kirsteins, Luanna Salk, MD  Menthol, Topical Analgesic, (ICY HOT EX) Apply 1 application topically as needed (pain).    [provider]  metFORMIN (GLUCOPHAGE-XR) 500 MG 24 hr tablet TAKE 2 TABLETS IN THE      MORNING AND AT BEDTIME 09/26/21    [name], Melanie Crazier, MD  methocarbamol (ROBAXIN) 500 MG tablet Take 500 mg by mouth 4 (four) times daily as needed for muscle spasms.    [provider]  Multiple Vitamin (MULTIVITAMIN) tablet Take 1 tablet by mouth daily.    [provider]  NONFORMULARY OR COMPOUNDED ITEM Power wheelchair 06/15/21   Carollee Herter, Alferd Apa, DO  NONFORMULARY OR COMPOUNDED [employer] wheelchair 08/04/21   Carollee Herter, Alferd Apa, DO  polyethylene glycol (MIRALAX / GLYCOLAX) 17 g packet Take 17 g by mouth daily as needed for mild constipation. 07/21/21   Davonna Belling, MD  pravastatin (PRAVACHOL) 40 MG tablet TAKE 1 TABLET DAILY 09/25/21   Carollee Herter, Alferd Apa, DO  sildenafil (VIAGRA) 100 MG tablet Take 0.5-1 tablets (50-100 mg total) by mouth daily as needed for erectile dysfunction. 07/24/21   [name] R, DO  tamsulosin (FLOMAX) 0.4 MG CAPS capsule TAKE ONE CAPSULE BY MOUTH ONE TIME DAILY 04/18/21   Carollee Herter, Alferd Apa, DO  torsemide (DEMADEX) 20 MG tablet Take 0.5 tablets (10 mg total) by mouth daily. 2 po qd 09/22/21   Carollee Herter, Alferd Apa, DO  traMADol (ULTRAM) 50 MG tablet 2 po q6 h prn 09/08/21   [name], DO      Allergies    Patient has no known allergies.    Review of Systems   Review of Systems  Constitutional:  Negative for chills and fever.  HENT:  Negative for congestion and rhinorrhea.   Respiratory:  Positive for shortness of breath.   Cardiovascular:  Positive for chest pain.  Gastrointestinal:  Positive for abdominal pain. Negative for diarrhea, nausea and vomiting.  Genitourinary:  Negative for dysuria and hematuria.  Musculoskeletal:  Positive for back pain. Negative for neck pain.  Neurological:  Positive for light-headedness. Negative for syncope, weakness and headaches.   Physical Exam Updated Vital Signs BP (!) 133/92    Pulse 80    Temp (!) 97.5 F (36.4 C) (Oral)    Resp 20    Ht 5\' 11"  (1.803 m)    Wt (!) 143.8 kg    SpO2 99%     BMI 44.21 kg/m  Physical Exam Vitals and nursing note reviewed.  Constitutional:      General: He is not in acute distress.    Appearance: Normal appearance. He is not ill-appearing, toxic-appearing or diaphoretic.     Comments: Morbidly obese  HENT:     Head: Normocephalic and atraumatic.     Mouth/Throat:     Mouth: Mucous membranes are moist.     Pharynx: No pharyngeal swelling.  Eyes:     General: No scleral icterus. Cardiovascular:     Rate and  Rhythm: Normal rate and regular rhythm.     Pulses: Normal pulses.     Heart sounds: Normal heart sounds.  Pulmonary:     Effort: Pulmonary effort is normal. No accessory muscle usage or respiratory distress.     Breath sounds: Normal breath sounds. No decreased breath sounds or wheezing.     Comments: Clear to auscultation bilaterally.  No respiratory distress, accessory muscle use, tripoding, nasal flaring, or cyanosis present.  Patient satting 99% on room air without any increased work of breathing.  Patient speaking in full sentences with ease. Chest:     Chest wall: No tenderness.     Comments: Palpable muscle spasm on the left flank. No overlying skin changes, deformity or rash noted.  Abdominal:     General: Abdomen is flat. Bowel sounds are normal.     Palpations: Abdomen is soft.     Tenderness: There is no guarding or rebound.     Comments: Abdominal exam limited secondary to body habitus.  Musculoskeletal:        General: No deformity.     Cervical back: Normal range of motion.     Right lower leg: No tenderness. Edema present.     Left lower leg: No tenderness. Edema present.     Comments: 1+ pitting edema to bilateral legs.   Skin:    General: Skin is warm and dry.     Capillary Refill: Capillary refill takes 2 to 3 seconds.     Findings: No rash.  Neurological:     Mental Status: He is alert and oriented to person, place, and time. Mental status is at baseline.     Cranial Nerves: No cranial nerve deficit.      Comments: Dropfoot present in left foot which patient reports is at baseline for him.  Equal grip strength.  Cranial nerves II through XII intact.    ED Results / Procedures / Treatments   Labs (all labs ordered are listed, but only abnormal results are displayed) Labs Reviewed  BASIC METABOLIC PANEL - Abnormal; Notable for the following components:      Result Value   Sodium 132 (*)    Glucose, Bld 239 (*)    BUN 21 (*)    All other components within normal limits  TROPONIN I (HIGH SENSITIVITY) - Abnormal; Notable for the following components:   Troponin I (High Sensitivity) 128 (*)    All other components within normal limits  TROPONIN I (HIGH SENSITIVITY) - Abnormal; Notable for the following components:   Troponin I (High Sensitivity) 134 (*)    All other components within normal limits  TROPONIN I (HIGH SENSITIVITY) - Abnormal; Notable for the following components:   Troponin I (High Sensitivity) 139 (*)    All other components within normal limits  RESP PANEL BY RT-PCR (FLU A&B, COVID) ARPGX2  CBC WITH DIFFERENTIAL/PLATELET  BRAIN NATRIURETIC PEPTIDE  TROPONIN I (HIGH SENSITIVITY)    EKG EKG Interpretation  Date/Time:  Thursday September 28 2021 12:00:37 EST Ventricular Rate:  88 PR Interval:  200 QRS Duration: 94 QT Interval:  354 QTC Calculation: 429 R Axis:   -2 Text Interpretation: No significant change since last tracing Sinus rhythm Confirmed by Fredia Sorrow [phone]) on 09/28/2021 12:39:29 PM  Radiology DG Chest Portable 1 View  Result Date: 09/28/2021 CLINICAL DATA:  Shortness of breath.  Chest pain. EXAM: PORTABLE CHEST 1 VIEW COMPARISON:  09/08/2021 FINDINGS: Cardiac silhouette is at the upper limits of size for AP technique. Mediastinal  contours are within normal limits. The lungs are clear. No pleural effusion or pneumothorax. Moderate multilevel degenerative disc changes of the thoracic spine. IMPRESSION: No active disease. Electronically Signed   By:  Yvonne Kendall M.D.   On: 09/28/2021 12:59   CT Angio Chest/Abd/Pel for Dissection W and/or Wo Contrast  Result Date: 09/28/2021 CLINICAL DATA:  Chest pain.  Shortness of breath. EXAM: CT ANGIOGRAPHY CHEST, ABDOMEN AND PELVIS TECHNIQUE: Non-contrast CT of the chest was initially obtained. Multidetector CT imaging through the chest, abdomen and pelvis was performed using the standard protocol during bolus administration of intravenous contrast. Multiplanar reconstructed images and MIPs were obtained and reviewed to evaluate the vascular anatomy. RADIATION DOSE REDUCTION: This exam was performed according to the departmental dose-optimization program which includes automated exposure control, adjustment of the mA and/or kV according to patient size and/or use of iterative reconstruction technique. CONTRAST:  189mL OMNIPAQUE IOHEXOL 350 MG/ML SOLN COMPARISON:  August 30, 2021. FINDINGS: CTA CHEST FINDINGS Cardiovascular: Preferential opacification of the thoracic aorta. No evidence of thoracic aortic aneurysm or dissection. Normal heart size. No pericardial effusion. Mediastinum/Nodes: No enlarged mediastinal, hilar, or axillary lymph nodes. Thyroid gland, trachea, and esophagus demonstrate no significant findings. Lungs/Pleura: Lungs are clear. No pleural effusion or pneumothorax. Musculoskeletal: No chest wall abnormality. No acute or significant osseous findings. Review of the MIP images confirms the above findings. CTA ABDOMEN AND PELVIS FINDINGS VASCULAR Aorta: Normal caliber aorta without aneurysm, dissection, vasculitis or significant stenosis. Celiac: Patent without evidence of aneurysm, dissection, vasculitis or significant stenosis. SMA: Patent without evidence of aneurysm, dissection, vasculitis or significant stenosis. Renals: Both renal arteries are patent without evidence of aneurysm, dissection, vasculitis, fibromuscular dysplasia or significant stenosis. IMA: Patent without evidence of aneurysm,  dissection, vasculitis or significant stenosis. Inflow: Patent without evidence of aneurysm, dissection, vasculitis or significant stenosis. Veins: No obvious venous abnormality within the limitations of this arterial phase study. Review of the MIP images confirms the above findings. NON-VASCULAR Hepatobiliary: No focal liver abnormality is seen. No gallstones, gallbladder wall thickening, or biliary dilatation. Pancreas: Unremarkable. No pancreatic ductal dilatation or surrounding inflammatory changes. Spleen: Normal in size without focal abnormality. Adrenals/Urinary Tract: Adrenal glands are unremarkable. Small nonobstructive left renal calculus is noted. No hydronephrosis or renal obstruction is noted. Bladder is unremarkable. Stomach/Bowel: Stomach is within normal limits. Appendix appears normal. No evidence of bowel wall thickening, distention, or inflammatory changes. Lymphatic: No adenopathy is noted. Reproductive: Prostate is unremarkable. Other: No abdominal wall hernia or abnormality. No abdominopelvic ascites. Musculoskeletal: No fracture is seen. Stable L3 sclerotic density is noted. Review of the MIP images confirms the above findings. IMPRESSION: No evidence of thoracic or abdominal aortic dissection or aneurysm. Small nonobstructive left renal calculus. No hydronephrosis or renal obstruction is noted. Electronically Signed   By: Marijo Conception M.D.   On: 09/28/2021 15:32    Procedures Procedures   Medications Ordered in ED Medications  iohexol (OMNIPAQUE) 350 MG/ML injection 125 mL (125 mLs Intravenous Contrast Given 09/28/21 1503)  traMADol (ULTRAM) tablet 50 mg (50 mg Oral Given 09/28/21 1636)  methocarbamol (ROBAXIN) tablet 500 mg (500 mg Oral Given 09/28/21 1636)  gabapentin (NEURONTIN) capsule 1,200 mg (1,200 mg Oral Given 09/28/21 1636)    ED Course/ Medical Decision Making/ A&P                           Medical Decision Making Amount and/or Complexity of Data Reviewed Labs:  ordered. Radiology:  ordered.  Risk Prescription drug management. Decision regarding hospitalization.   61 year old male presents emergency [facility] for evaluation of left-sided chest/left upper quadrant pain radiating to his back with shortness of breath and dyspnea on exertion for the past few weeks.  Differential diagnosis includes was not limited to CHF, PE, dissection, pneumonia, muscle spasm, costochondritis, splenic infarct, pneumothorax, ACS.  Vital signs show elevated blood pressure and a slightly decreased temperature although the patient satting 99% on room air without any increased work of breathing.  Normal heart rate.  The patient is morbidly obese.  Physical exam is pertinent for a palpable muscle spasm on his left flank going into his left lateral back area.  No overlying skin changes step-off or deformity noted there.  No overlying rash noted.  He denies any trauma to the area.  He has 2+ radial pulses and palpable DP and PT pulses.  He does have 1+ pitting edema to his lower extremities.  His abdominal exam is limited secondary to body habitus but I did not appreciate any guarding, rebound, or tenderness.  His breath sounds were clear do not appreciate any wheezing or rhonchi.  There is no history distress, assessor muscle use, tripoding, nasal flaring, or cyanosis present.  Overall he was well-appearing and not in any acute distress.  Asking for snacks.  I independently reviewed and interpreted the patient's labs and imaging and agree with radiologist findings.  CBC does not show any signs of leukocytosis or anemia.  BMP shows slightly decreased sodium at 132 although the patient's glucose is 239 this is likely pseudohyponatremia.  Slightly increased BUN at 21 although the patient has normal creatinine function and normal potassium.  BNP was 18.6.  Negative COVID and flu.  His initial troponin was 128 with repeat at 134 and then 139.  Although these are slightly elevated, these are mostly  unchanged.  Only lab value I have to compare to as well 11 months ago that was 19.  The patient reports he has had a troponin at [facility] but I am unable to see under care everywhere in June 2022 that he showed me on his phone that was 9.  Again, this is significantly changed from his previous labs from a few months ago.  He does not have any EKG changing since his last tracing showing sinus rhythm.  Our main concern was for dissection.  CTA chest abdomen pelvis shows  no evidence of thoracic or abdominal aortic dissection or aneurysm. Small nonobstructive left renal calculus. No hydronephrosis or renal obstruction is noted.  X-ray shows no acute cardiopulmonary process.  Patient was given his typical afternoon pain medications of gabapentin, Robaxin, and tramadol.  On reevaluation, the patient is well-appearing on his phone and eating snacks.  I discussed with him possible admission which the patient is amenable for.  Given the elevated, but flat troponins, I think this is a criteria for admission for further work-up given his last cath was in 2013.  He has had no other examples of elevated troponin in the past.  Page put out for admission team and spoke to Dr. Karleen Hampshire who will admit for elevated troponins.  I discussed this case with my attending physician who cosigned this note including patient's presenting symptoms, physical exam, and planned diagnostics and interventions. Attending physician stated agreement with plan or made changes to plan which were implemented.    Final Clinical Impression(s) / ED Diagnoses Final diagnoses:  Elevated troponin    Rx / DC Orders ED  Discharge Orders     None         Sherrell Puller, [place] 09/28/21 1955    Fredia Sorrow, MD 10/01/21 [phone]

## 2021-09-28 NOTE — Assessment & Plan Note (Signed)
·   Longstanding history  Receiving regular outpatient infusions of IVIG

## 2021-09-28 NOTE — Assessment & Plan Note (Addendum)
•   Resume patients home regimen of oral antihypertensives  Titrate antihypertensive regimen as necessary to achieve adequate BP control  PRN intravenous antihypertensives for excessively elevated blood pressure  Patient will be discharged on metoprolol as per cardiology

## 2021-09-28 NOTE — H&P (Signed)
History and Physical    Patient: Tyler Brewer MRN: [id number] DOA: 09/28/2021  Date of Service: the patient was seen and examined on 09/28/2021  Patient coming from:  Home via [facility]  Chief Complaint:  Chief Complaint  Patient presents with   Shortness of Breath    HPI:   61 year old male with past medical history of hypertension, insulin-dependent diabetes mellitus type 2, hyperlipidemia, psoriatic arthritis, benign prostatic hyperplasia, diastolic congestive heart failure (Echo 11/2012 EF 55-60% with G1DD), coronary artery disease (cath 2013 with nonobstructive CAD), as well as chronic inflammatory demyelinating polyneuropathy who presents to the [facility] emergency department with complaints of shortness of breath and left-sided chest discomfort.  Patient explains that several days ago he began to experience chest discomfort.  Patient localizes this chest discomfort to the left anterior chest, describes it as sharp in quality as well as waxing and waning.  Pain seems to be worse with movement but improved with rest.  Patient states that this discomfort seems to be radiating towards the back.  Patient is also complaining of associated shortness of breath.  Patient denies any cough nausea or vomiting.  Patient also complains of bilateral lower extremity swelling however seems that this has been ongoing and has not acutely changed.  Patient's symptoms continued to persist over the next several days.  Until the patient eventually presented to [place] emergency department for evaluation.  Upon evaluation in the emergency department CT angiogram of the chest was performed with as found to be negative for pulmonary embolism or aortic dissection.  Patient was noted to have elevated troponins of 128, 134, 139 and 134.  EKG was performed revealing no dynamic ST segment change.  ER provider discussed case with hospitalist group and patient was excepted for transfer to [facility] for continued medical work-up.  Review of Systems: Review of Systems  Respiratory:  Positive for shortness of breath.   Cardiovascular:  Positive for chest pain.    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[place])    Neuropathy    Obesity    Max weight was 390   OSA on CPAP    Panic attacks    Testosterone deficiency    Vertigo     Past Surgical History:  Procedure Laterality Date   CARDIAC CATHETERIZATION     IR FLUORO GUIDE CV LINE RIGHT  11/10/2020   IR US GUIDE VASC ACCESS RIGHT  11/10/2020   LEFT HEART CATHETERIZATION WITH CORONARY ANGIOGRAM N/A 03/31/2012   Procedure: LEFT HEART CATHETERIZATION WITH CORONARY ANGIOGRAM;  Surgeon: Leonie Man, MD;  Location: [facility] CATH LAB;  Service: Cardiovascular;  Laterality: N/A;    Social History:  reports that he quit smoking about 5 months ago. His smoking use included cigarettes. He smoked an average of .5 packs per day. He has never used smokeless tobacco. He reports that he does not currently use drugs after having used the following drugs: Methamphetamines. He reports that he does not drink alcohol.  No Known Allergies  Family History  Problem Relation Age of Onset   Diabetes type II Father    Hypertension Father    Pancreatic disease Father 48       Deceased   Healthy Mother    Healthy Sister    Healthy Son    Healthy Daughter    Parkinson's disease Maternal Grandmother     Prior to Admission medications   Medication Sig Start Date End Date Taking? Authorizing Provider   acetaminophen (TYLENOL) 500 MG tablet Take 500-1,000 mg by mouth every 6 (six) hours as needed for mild pain.    [provider]  AMBULATORY NON FORMULARY MEDICATION Medication Name: [facility] Wheelchair 07/31/21   Carollee Herter, Alferd Apa, DO  amLODipine (NORVASC) 10 MG tablet Take 1 tablet (10 mg total) by mouth daily. 08/24/21   [name], DO  COMFORT EZ INSULIN SYRINGE 31G X 5/16" 1 ML MISC  12/15/20   [provider]  Cyanocobalamin (VITAMIN B-12 PO) Take 1 tablet by mouth daily.    [provider]  diclofenac Sodium (VOLTAREN) 1 % GEL Apply 4 g topically 4 (four) times daily as needed (joint pain).    [provider]  DULoxetine (CYMBALTA) 60 MG capsule Take 1 capsule by mouth daily. 05/06/21   [provider]  hydrALAZINE (APRESOLINE) 50 MG tablet TAKE 1 TABLET(50 MG) BY MOUTH EVERY 8 HOURS 08/01/21   Carollee Herter, Alferd Apa, DO  hydrOXYzine (ATARAX) 25 MG tablet Take 1 tablet (25 mg total) by mouth every 6 (six) hours as needed for anxiety. 08/09/21   [name], DO  ibuprofen (ADVIL) 200 MG tablet Take 400 mg by mouth every 6 (six) hours as needed for mild pain.    [provider]  immune globulin, human, (GAMMAGARD S/D) 5 g injection Inject into the vein every 21 ( twenty-one) days.    [provider]  insulin aspart (NOVOLOG FLEXPEN) 100 UNIT/ML FlexPen Max daily 80 units per scale 08/24/21   [name], Melanie Crazier, MD  insulin degludec (TRESIBA FLEXTOUCH) 100 UNIT/ML FlexTouch Pen Inject 30 Units into the skin daily. 08/24/21   [name], Melanie Crazier, MD  Insulin Pen Needle 31G X 5 MM MISC 1 Device by Does not apply route in the morning, at noon, in the evening, and at bedtime. 08/24/21   [name], Melanie Crazier, MD  losartan (COZAAR) 100 MG tablet TAKE ONE TABLET BY MOUTH DAILY 04/18/21   Carollee Herter, Alferd Apa, DO  MAGNESIUM PO Take 1 tablet by mouth daily.    [provider]  meclizine (ANTIVERT)  25 MG tablet Take 1 tablet (25 mg total) by mouth 3 (three) times daily as needed for dizziness. 02/18/21   Molpus, John, MD  meloxicam (MOBIC) 15 MG tablet Take 1 tablet (15 mg total) by mouth daily. 03/14/21   Kirsteins, Luanna Salk, MD  Menthol, Topical Analgesic, (ICY HOT EX) Apply 1 application topically as needed (pain).    [provider]  metFORMIN (GLUCOPHAGE-XR) 500 MG 24 hr tablet TAKE 2 TABLETS IN THE      MORNING AND AT BEDTIME 09/26/21   [name], Melanie Crazier, MD  methocarbamol (ROBAXIN) 500 MG tablet Take 500 mg by mouth 4 (four) times daily as needed for muscle spasms.    [provider]  Multiple Vitamin (MULTIVITAMIN) tablet Take 1 tablet by mouth daily.    [provider]  NONFORMULARY OR COMPOUNDED ITEM Power wheelchair 06/15/21   Carollee Herter, Alferd Apa,  DO  NONFORMULARY OR COMPOUNDED ITEM [place] wheelchair 08/04/21   Carollee Herter, Alferd Apa, DO  polyethylene glycol (MIRALAX / GLYCOLAX) 17 g packet Take 17 g by mouth daily as needed for mild constipation. 07/21/21   Davonna Belling, MD  pravastatin (PRAVACHOL) 40 MG tablet TAKE 1 TABLET DAILY 09/25/21   Carollee Herter, Alferd Apa, DO  sildenafil (VIAGRA) 100 MG tablet Take 0.5-1 tablets (50-100 mg total) by mouth daily as needed for erectile dysfunction. 07/24/21   [name] R, DO  tamsulosin (FLOMAX) 0.4 MG CAPS capsule TAKE ONE CAPSULE BY MOUTH ONE TIME DAILY 04/18/21   Carollee Herter, Alferd Apa, DO  torsemide (DEMADEX) 20 MG tablet Take 0.5 tablets (10 mg total) by mouth daily. 2 po qd 09/22/21   Carollee Herter, Alferd Apa, DO  traMADol (ULTRAM) 50 MG tablet 2 po q6 h prn 09/08/21   [name], DO    Physical Exam:  Vitals:   09/28/21 1730 09/28/21 1917 09/28/21 1930 09/28/21 2044  BP: (!) 146/97 (!) 150/82 (!) 152/88 136/68  Pulse:  83 80 82  Resp: (!) 32  20 16  Temp:   (!) 97.5 F (36.4 C) 98.2 F (36.8 C)  TempSrc:   Oral Oral  SpO2:  96% 95% 100%  Weight:    (!) 141 kg  Height:    5\' 11"   (1.803 m)    Constitutional: Awake alert and oriented x3, no associated distress.   Skin: no rashes, no lesions, good skin turgor noted. Eyes: Pupils are equally reactive to light.  No evidence of scleral icterus or conjunctival pallor.  ENMT: Moist mucous membranes noted.  Posterior pharynx clear of any exudate or lesions.   Neck: normal, supple, no masses, no thyromegaly.  No evidence of jugular venous distension.   Respiratory: clear to auscultation bilaterally, no wheezing, no crackles. Normal respiratory effort. No accessory muscle use.  Cardiovascular: Regular rate and rhythm, no murmurs / rubs / gallops. No extremity edema. 2+ pedal pulses. No carotid bruits.  Chest:   Some notable left anterior chest tenderness without crepitus or deformity.   Back:   Nontender without crepitus or deformity. Abdomen: Notable tenderness at the epigastric and left upper quadrant.  Abdomen is soft however.  No evidence of organomegaly. Musculoskeletal: No joint deformity upper and lower extremities. Good ROM, no contractures. Normal muscle tone.  Neurologic: CN 2-12 grossly intact. Sensation intact.  Patient moving all 4 extremities spontaneously.  Patient is following all commands.  Patient is responsive to verbal stimuli.   Psychiatric: Patient exhibits normal mood with appropriate affect.  Patient seems to possess insight as to their current situation.    Data Reviewed:  I have personally reviewed and interpreted labs, imaging.  Significant findings are troponins of 128, 134, 139, 134.  CT angiogram of the chest reviewed revealing no evidence of pulmonary embolism.  EKG: Personally reviewed.  Rhythm is normal sinus rhythm with heart rate of 88 bpm.  No dynamic ST segment changes appreciated.  Assessment/Plan  Assessment and Plan: * Acute left flank pain- (present on admission) 2 to 3-week history of waxing and waning severe left flank, epigastric and left-sided chest discomfort Etiology  unclear CT of chest abdomen pelvis reveals no evidence of pulmonary embolism, pneumonia, left kidney hydronephrosis or obstructing stone or pyelonephritis Patient found to have somewhat elevated troponin however with flat trajectory of serial enzymes bringing up questions as to their clinical significance Evaluating further with urinalysis, lipase, inflammatory markers, echocardiogram, bladder scan  Monitoring patient on telemetry Left leg Lidoderm patch for pain control with continuation of home regimen of as needed Ultram  Elevated troponin- (present on admission) Notably elevated troponins however serial cardiac enzymes are found to have flat trajectory of elevation No evidence of pericarditis on EKG however will obtain echocardiogram N.p.o. after midnight and cardiology consultation in the morning in case the they determined there is a need for a noninvasive ischemic assessment during this hospitalization Lipid panel, hemoglobin A1c Cardiac catheterization performed in 2013 did reveal nonobstructive disease with moderate disease in a septal branch of the ramus intermedius As needed Nitroglycerin Monitoring patient on telemetry  Coronary artery disease involving native coronary artery of native heart- (present on admission) Please see assessment and plan above Monitoring patient on telemetry Continue home regimen of  lipid lowering therapy Providing patient with aspirin 325 mg daily.  Essential hypertension- (present on admission) Resume patients home regimen of oral antihypertensives Titrate antihypertensive regimen as necessary to achieve adequate BP control PRN intravenous antihypertensives for excessively elevated blood pressure    Uncontrolled type 2 diabetes mellitus with hyperglycemia, with long-term current use of insulin ([place]) Patient been placed on Accu-Cheks before every meal and nightly with sliding scale insulin Continue home regimen of basal insulin therapy Holding  home regimen of metformin Hemoglobin A1C ordered Diabetic Diet   CIDP (chronic inflammatory demyelinating polyneuropathy) ([place])- (present on admission) Longstanding history Receiving regular outpatient infusions of IVIG  Chronic diastolic CHF (congestive heart failure) ([place])- (present on admission) No clinical evidence of cardiogenic volume overload     BPH (benign prostatic hyperplasia)- (present on admission) Continue home regimen of Flovent Obtaining postvoid residual bladder scan       Code Status:  Full code  code status decision has been confirmed with: patient Family Communication: deferred   Consults: Cardiology  Severity of Illness:  The appropriate patient status for this patient is OBSERVATION. Observation status is judged to be reasonable and necessary in order to provide the required intensity of service to ensure the patient's safety. The patient's presenting symptoms, physical exam findings, and initial radiographic and laboratory data in the context of their medical condition is felt to place them at decreased risk for further clinical deterioration. Furthermore, it is anticipated that the patient will be medically stable for discharge from the hospital within 2 midnights of admission.   Author:  Vernelle Emerald MD  09/28/2021 11:04 PM

## 2021-09-29 ENCOUNTER — Observation Stay (HOSPITAL_BASED_OUTPATIENT_CLINIC_OR_DEPARTMENT_OTHER): Payer: Medicare HMO

## 2021-09-29 ENCOUNTER — Other Ambulatory Visit (HOSPITAL_BASED_OUTPATIENT_CLINIC_OR_DEPARTMENT_OTHER): Payer: Medicare HMO

## 2021-09-29 ENCOUNTER — Other Ambulatory Visit: Payer: Self-pay | Admitting: Student

## 2021-09-29 DIAGNOSIS — R079 Chest pain, unspecified: Secondary | ICD-10-CM

## 2021-09-29 DIAGNOSIS — R778 Other specified abnormalities of plasma proteins: Secondary | ICD-10-CM | POA: Diagnosis not present

## 2021-09-29 DIAGNOSIS — R109 Unspecified abdominal pain: Secondary | ICD-10-CM | POA: Diagnosis not present

## 2021-09-29 DIAGNOSIS — R0602 Shortness of breath: Secondary | ICD-10-CM

## 2021-09-29 LAB — CBC WITH DIFFERENTIAL/PLATELET
Abs Immature Granulocytes: 0.01 10*3/uL (ref 0.00–0.07)
Basophils Absolute: 0.1 10*3/uL (ref 0.0–0.1)
Basophils Relative: 1 %
Eosinophils Absolute: 0.2 10*3/uL (ref 0.0–0.5)
Eosinophils Relative: 5 %
HCT: 40.9 % (ref 39.0–52.0)
Hemoglobin: 13.6 g/dL (ref 13.0–17.0)
Immature Granulocytes: 0 %
Lymphocytes Relative: 23 %
Lymphs Abs: 1.1 10*3/uL (ref 0.7–4.0)
MCH: 29.2 pg (ref 26.0–34.0)
MCHC: 33.3 g/dL (ref 30.0–36.0)
MCV: 87.8 fL (ref 80.0–100.0)
Monocytes Absolute: 0.4 10*3/uL (ref 0.1–1.0)
Monocytes Relative: 8 %
Neutro Abs: 3 10*3/uL (ref 1.7–7.7)
Neutrophils Relative %: 63 %
Platelets: 231 10*3/uL (ref 150–400)
RBC: 4.66 MIL/uL (ref 4.22–5.81)
RDW: 13 % (ref 11.5–15.5)
WBC: 4.7 10*3/uL (ref 4.0–10.5)
nRBC: 0 % (ref 0.0–0.2)

## 2021-09-29 LAB — COMPREHENSIVE METABOLIC PANEL
ALT: 25 U/L (ref 0–44)
AST: 22 U/L (ref 15–41)
Albumin: 3.2 g/dL — ABNORMAL LOW (ref 3.5–5.0)
Alkaline Phosphatase: 77 U/L (ref 38–126)
Anion gap: 10 (ref 5–15)
BUN: 20 mg/dL (ref 6–20)
CO2: 23 mmol/L (ref 22–32)
Calcium: 9.2 mg/dL (ref 8.9–10.3)
Chloride: 100 mmol/L (ref 98–111)
Creatinine, Ser: 1.04 mg/dL (ref 0.61–1.24)
GFR, Estimated: >60 mL/min (ref >60)
Glucose, Bld: 357 mg/dL — ABNORMAL HIGH (ref 70–99)
Potassium: 4.5 mmol/L (ref 3.5–5.1)
Sodium: 133 mmol/L — ABNORMAL LOW (ref 135–145)
Total Bilirubin: 0.4 mg/dL (ref 0.3–1.2)
Total Protein: 7.5 g/dL (ref 6.5–8.1)

## 2021-09-29 LAB — ECHOCARDIOGRAM COMPLETE
AR max vel: 2.95 cm2
AV Peak grad: 8.2 mmHg
Ao pk vel: 1.43 m/s
Area-P 1/2: 3.81 cm2
Calc EF: 57.2 %
Height: 71 in
S' Lateral: 3.6 cm
Single Plane A2C EF: 59.7 %
Single Plane A4C EF: 56.1 %
Weight: 4973.58 [oz_av]

## 2021-09-29 LAB — HEMOGLOBIN A1C
Hgb A1c MFr Bld: 8.5 % — ABNORMAL HIGH (ref 4.8–5.6)
Mean Plasma Glucose: 197.25 mg/dL

## 2021-09-29 LAB — URINALYSIS, COMPLETE (UACMP) WITH MICROSCOPIC
Bacteria, UA: NONE SEEN
Bilirubin Urine: NEGATIVE
Glucose, UA: >=500 mg/dL — AB
Hgb urine dipstick: NEGATIVE
Ketones, ur: NEGATIVE mg/dL
Leukocytes,Ua: NEGATIVE
Nitrite: NEGATIVE
Protein, ur: NEGATIVE mg/dL
Specific Gravity, Urine: 1.041 — ABNORMAL HIGH (ref 1.005–1.030)
pH: 5 (ref 5.0–8.0)

## 2021-09-29 LAB — MAGNESIUM: Magnesium: 1.7 mg/dL (ref 1.7–2.4)

## 2021-09-29 LAB — GLUCOSE, CAPILLARY
Glucose-Capillary: 215 mg/dL — ABNORMAL HIGH (ref 70–99)
Glucose-Capillary: 232 mg/dL — ABNORMAL HIGH (ref 70–99)

## 2021-09-29 LAB — LIPASE, BLOOD: Lipase: 30 U/L (ref 11–51)

## 2021-09-29 LAB — SEDIMENTATION RATE: Sed Rate: 35 mm/hr — ABNORMAL HIGH (ref 0–16)

## 2021-09-29 MED ORDER — FUROSEMIDE 20 MG PO TABS: 20.0000 mg | ORAL_TABLET | Freq: Two times a day (BID) | ORAL | 0 refills | Status: AC

## 2021-09-29 MED ORDER — METOPROLOL TARTRATE 100 MG PO TABS: 100.0000 mg | ORAL_TABLET | Freq: Once | ORAL | 0 refills | Status: DC

## 2021-09-29 MED ORDER — ASPIRIN EC 81 MG PO TBEC
81.0000 mg | DELAYED_RELEASE_TABLET | Freq: Every day | ORAL | 2 refills | Status: AC
Start: 2021-09-29 — End: 2022-09-29

## 2021-09-29 MED ORDER — NITROGLYCERIN 0.4 MG SL SUBL
0.4000 mg | SUBLINGUAL_TABLET | SUBLINGUAL | 0 refills | Status: AC | PRN
Start: 2021-09-29 — End: ?

## 2021-09-29 MED ORDER — GABAPENTIN 300 MG PO CAPS: 600.0000 mg | ORAL_CAPSULE | Freq: Three times a day (TID) | ORAL | 0 refills | Status: AC

## 2021-09-29 MED ORDER — LIDOCAINE 5 % EX PTCH: 1.0000 | MEDICATED_PATCH | CUTANEOUS | 0 refills | Status: AC

## 2021-09-29 MED ORDER — CALCIUM CARBONATE ANTACID 500 MG PO CHEW
1.0000 | CHEWABLE_TABLET | Freq: Four times a day (QID) | ORAL | Status: DC | PRN
  Filled 2021-09-29: qty 1

## 2021-09-29 MED ORDER — LACTATED RINGERS IV SOLN: INTRAVENOUS | Status: AC

## 2021-09-29 MED ORDER — FUROSEMIDE 20 MG PO TABS
20.0000 mg | ORAL_TABLET | Freq: Two times a day (BID) | ORAL | Status: DC
  Administered 2021-09-29: 20 mg via ORAL
  Filled 2021-09-29: qty 1

## 2021-09-29 MED ORDER — CALCIUM CARBONATE ANTACID 500 MG PO CHEW: 1.0000 | CHEWABLE_TABLET | Freq: Four times a day (QID) | ORAL | 0 refills | Status: AC | PRN

## 2021-09-29 MED ORDER — OMEPRAZOLE MAGNESIUM 20 MG PO TBEC: 20.0000 mg | DELAYED_RELEASE_TABLET | Freq: Every day | ORAL | 1 refills | Status: DC

## 2021-09-29 NOTE — Discharge Summary (Signed)
Physician Discharge Summary   Patient: Tyler Brewer MRN: [id number] DOB: [birth year]  Admit date:     09/28/2021  Discharge date: 09/29/21  Discharge Physician: Karie Kirks   PCP: [name], DO   Recommendations at discharge:    Discharge to home. Follow up with cardiology to schedule Cardiac CTA as outpatient. Follow up with PCP in 7-10 days.  Discharge Diagnoses: Principal Problem:   Acute left flank pain Active Problems:   Coronary artery disease involving native coronary artery of native heart   Essential hypertension   Uncontrolled type 2 diabetes mellitus with hyperglycemia, with long-term current use of insulin (HCC)   CIDP (chronic inflammatory demyelinating polyneuropathy) (HCC)   Elevated troponin   BPH (benign prostatic hyperplasia)   Chronic diastolic CHF (congestive heart failure) ([place])  Resolved Problems:   * No resolved hospital problems. *   Hospital Course: No notes on file  Assessment and Plan: Assessment and Plan: * Acute left flank pain- (present on admission) 2 to 3-week history of waxing and waning severe left flank, epigastric and left-sided chest discomfort Etiology unclear CT of chest abdomen pelvis reveals no evidence of pulmonary embolism, pneumonia, left kidney hydronephrosis or obstructing stone or pyelonephritis Patient found to have somewhat elevated troponin however with flat trajectory of serial enzymes bringing up questions as to their clinical significance Evaluating further with urinalysis, lipase, inflammatory markers, echocardiogram, bladder scan Monitoring patient on telemetry Left leg Lidoderm patch for pain control with continuation of home regimen of as needed Ultram Chronic pain. Likely musculoskeletal in etiology.  Chronic diastolic CHF (congestive heart failure) ([place])- (present on admission) Echocardiogram demonstrated EF 55-60% with normal LV function. Diastolic parameters are indeterminate. There is LVH. No wall  motion abnormalities.      BPH (benign prostatic hyperplasia)- (present on admission) Continue home regimen of Flovent Obtaining postvoid residual bladder scan  Elevated troponin- (present on admission) Notably elevated troponins however serial cardiac enzymes are found to have flat trajectory of elevation No evidence of pericarditis on EKG however will obtain echocardiogram N.p.o. after midnight and cardiology consultation in the morning in case the they determined there is a need for a noninvasive ischemic assessment during this hospitalization Lipid panel, hemoglobin A1c Cardiac catheterization performed in 2013 did reveal nonobstructive disease with moderate disease in a septal branch of the ramus intermedius Monitoring patient on telemetry Continue home regimen of  lipid lowering therapy Cardiology was consulted. Dr. Stanford Breed has evaluated the patient and his echocardiogram. He has cleared the patient for discharge.  The patient will be scheduled for Cardiac CTA as outpatient. He will follow up with cardiology. The patient is being discharged on asa EC 81 mg daily and NTG.  CIDP (chronic inflammatory demyelinating polyneuropathy) (HCC)- (present on admission) Longstanding history Receiving regular outpatient infusions of IVIG  Uncontrolled type 2 diabetes mellitus with hyperglycemia, with long-term current use of insulin ([place]) Patient been placed on Accu-Cheks before every meal and nightly with sliding scale insulin Continue home regimen of basal insulin therapy Holding home regimen of metformin Hemoglobin A1C ordered Diabetic Diet   Essential hypertension- (present on admission) Resume patients home regimen of oral antihypertensives Titrate antihypertensive regimen as necessary to achieve adequate BP control PRN intravenous antihypertensives for excessively elevated blood pressure Patient will be discharged on metoprolol as per cardiology    Coronary artery disease  involving native coronary artery of native heart- (present on admission) Please see assessment and plan above Monitoring patient on telemetry Continue home regimen of  lipid  lowering therapy Cardiology was consulted. Dr. Stanford Breed has evaluated the patient and his echocardiogram. He has cleared the patient for discharge.  The patient will be scheduled for Cardiac CTA as outpatient. He will follow up with cardiology. The patient is being discharged on asa EC 81 mg daily and NTG.   BMI: 43.35   Consultants: Cardiology Procedures performed: None Disposition: Home Diet recommendation:  Discharge Diet Orders (From admission, onward)     Start     Ordered   09/29/21 0000  Diet - low sodium heart healthy        09/29/21 1452   09/29/21 0000  Diet Carb Modified        09/29/21 1452           Cardiac diet  DISCHARGE MEDICATION: Allergies as of 09/29/2021   No Known Allergies      Medication List     STOP taking these medications    ibuprofen 200 MG tablet Commonly known as: ADVIL   ICY HOT EX   meclizine 25 MG tablet Commonly known as: ANTIVERT   meloxicam 15 MG tablet Commonly known as: MOBIC   sildenafil 100 MG tablet Commonly known as: Viagra   torsemide 20 MG tablet Commonly known as: DEMADEX       TAKE these medications    acetaminophen 500 MG tablet Commonly known as: TYLENOL Take 500-1,000 mg by mouth every 6 (six) hours as needed for mild pain.   AMBULATORY NON FORMULARY MEDICATION Medication Name: Audelia Hives Wheelchair   amLODipine 10 MG tablet Commonly known as: NORVASC Take 1 tablet (10 mg total) by mouth daily.   aspirin EC 81 MG tablet Take 1 tablet (81 mg total) by mouth daily. Swallow whole.   calcium carbonate 500 MG chewable tablet Commonly known as: TUMS - dosed in mg elemental calcium Chew 1 tablet (200 mg of elemental calcium total) by mouth every 6 (six) hours as needed for indigestion or heartburn.   diclofenac Sodium 1 %  Gel Commonly known as: VOLTAREN Apply 4 g topically 4 (four) times daily as needed (joint pain).   DULoxetine 60 MG capsule Commonly known as: CYMBALTA Take 60 mg by mouth daily.   furosemide 20 MG tablet Commonly known as: LASIX Take 1 tablet (20 mg total) by mouth 2 (two) times daily.   gabapentin 300 MG capsule Commonly known as: NEURONTIN Take 2 capsules (600 mg total) by mouth 3 (three) times daily.   hydrALAZINE 50 MG tablet Commonly known as: APRESOLINE TAKE 1 TABLET(50 MG) BY MOUTH EVERY 8 HOURS What changed: See the new instructions.   hydrOXYzine 25 MG tablet Commonly known as: ATARAX Take 1 tablet (25 mg total) by mouth every 6 (six) hours as needed for anxiety.   immune globulin (human) 5 g injection Commonly known as: GAMMAGARD S/D Inject into the vein every 21 ( twenty-one) days. Patient not sure of dose   Insulin Pen Needle 31G X 5 MM Misc 1 Device by Does not apply route in the morning, at noon, in the evening, and at bedtime.   lidocaine 5 % Commonly known as: LIDODERM Place 1 patch onto the skin daily. Remove & Discard patch within 12 hours or as directed by MD   losartan 100 MG tablet Commonly known as: COZAAR TAKE ONE TABLET BY MOUTH DAILY   MAGNESIUM PO Take 1 tablet by mouth daily.   metFORMIN 500 MG 24 hr tablet Commonly known as: GLUCOPHAGE-XR TAKE 2 TABLETS IN THE  MORNING AND AT BEDTIME What changed: See the new instructions.   methocarbamol 500 MG tablet Commonly known as: ROBAXIN Take 500 mg by mouth 4 (four) times daily as needed for muscle spasms.   metoprolol tartrate 100 MG tablet Commonly known as: LOPRESSOR Take 1 tablet (100 mg total) by mouth once for 1 dose. Take 1.5-2 hours before coronary CTA.   multivitamin tablet Take 1 tablet by mouth daily.   nitroGLYCERIN 0.4 MG SL tablet Commonly known as: NITROSTAT Place 1 tablet (0.4 mg total) under the tongue every 5 (five) minutes as needed for chest pain.    NONFORMULARY OR COMPOUNDED ITEM Power wheelchair   NONFORMULARY OR COMPOUNDED ITEM Manuel wheelchair   NovoLOG FlexPen 100 UNIT/ML FlexPen Generic drug: insulin aspart Max daily 80 units per scale What changed:  how much to take how to take this when to take this   omeprazole 20 MG tablet Commonly known as: PriLOSEC OTC Take 1 tablet (20 mg total) by mouth daily.   polyethylene glycol 17 g packet Commonly known as: MIRALAX / GLYCOLAX Take 17 g by mouth daily as needed for mild constipation.   pravastatin 40 MG tablet Commonly known as: PRAVACHOL TAKE 1 TABLET DAILY   tamsulosin 0.4 MG Caps capsule Commonly known as: FLOMAX TAKE ONE CAPSULE BY MOUTH ONE TIME DAILY   traMADol 50 MG tablet Commonly known as: ULTRAM 2 po q6 h prn What changed:  how much to take how to take this when to take this reasons to take this additional instructions   Tresiba FlexTouch 100 UNIT/ML FlexTouch Pen Generic drug: insulin degludec Inject 30 Units into the skin daily.   VITAMIN B-12 PO Take 1 tablet by mouth daily.        Follow-up Information     Darreld Mclean, PA-C Follow up.   Specialties: Physician Assistant, Cardiology Why: Hospital follow-up with Cardiology scheduled for 10/27/2021 at 8:25am. Please arrive 15 minutes early for check-in. If this date/time does not work for you, please call our office to reschedule. Our office will call you to arrange coronary CTA. Contact information: [address] [place] [address] [phone]                 Discharge Exam: Danley Danker Weights   09/28/21 1202 09/28/21 2044  Weight: (!) 143.8 kg (!) 141 kg   Vitals:   09/29/21 1110 09/29/21 1411  BP: 116/60 130/60  Pulse: 90   Resp: 20   Temp: 98.4 F (36.9 C)   SpO2: 94%    Exam:  Constitutional:  The patient is awake, alert, and oriented x 3. No acute distress. Respiratory:  No increased work of breathing. No wheezes, rales, or rhonchi No  tactile fremitus Cardiovascular:  Regular rate and rhythm No murmurs, ectopy, or gallups. No lateral PMI. No thrills. Abdomen:  Abdomen is soft, non-tender, non-distended No hernias, masses, or organomegaly Normoactive bowel sounds.  Musculoskeletal:  No cyanosis, clubbing, or edema Skin:  No rashes, lesions, ulcers palpation of skin: no induration or nodules Neurologic:  CN 2-12 intact Sensation all 4 extremities intact Psychiatric:  Mental status Mood, affect appropriate Orientation to person, place, time  judgment and insight appear intact   Condition at discharge: fair  The results of significant diagnostics from this hospitalization (including imaging, microbiology, ancillary and laboratory) are listed below for reference.   Imaging Studies: DG Chest 2 View  Result Date: 09/09/2021 CLINICAL DATA:  Bilateral lower extremity edema. EXAM: CHEST - 2 VIEW  COMPARISON:  April 20, 2021 FINDINGS: The heart size and mediastinal contours are within normal limits. Low lung volumes are seen with bibasilar crowding of the bronchovascular lung markings. There is no evidence of acute infiltrate, pleural effusion or pneumothorax. Both lungs are clear. The visualized skeletal structures are unremarkable. IMPRESSION: Low lung volumes and bibasilar crowding without acute cardiopulmonary disease. Electronically Signed   By: Virgina Norfolk M.D.   On: 09/09/2021 03:53   DG Chest Portable 1 View  Result Date: 09/28/2021 CLINICAL DATA:  Shortness of breath.  Chest pain. EXAM: PORTABLE CHEST 1 VIEW COMPARISON:  09/08/2021 FINDINGS: Cardiac silhouette is at the upper limits of size for AP technique. Mediastinal contours are within normal limits. The lungs are clear. No pleural effusion or pneumothorax. Moderate multilevel degenerative disc changes of the thoracic spine. IMPRESSION: No active disease. Electronically Signed   By: Yvonne Kendall M.D.   On: 09/28/2021 12:59   ECHOCARDIOGRAM  COMPLETE  Result Date: 09/29/2021    ECHOCARDIOGRAM REPORT   Patient Name:   KASTIN CERDA Date of Exam: 09/29/2021 Medical Rec #:  [id number]       Height:       71.0 in Accession #:    [id number]      Weight:       310.8 lb Date of Birth:  01/15/1961       BSA:          2.543 m Patient Age:    41 years        BP:           136/68 mmHg Patient Gender: M               HR:           75 bpm. Exam Location:  Inpatient Procedure: 2D Echo, Cardiac Doppler and Color Doppler Indications:    Chest pain  History:        Patient has no prior history of Echocardiogram examinations.                 CHF, CAD; Risk Factors:Hypertension, Diabetes, Dyslipidemia and                 Sleep Apnea.  Sonographer:    Jyl Heinz Referring Phys: [id number] [place]  1. Left ventricular ejection fraction, by estimation, is 55 to 60%. The left ventricle has normal function. The left ventricle has no regional wall motion abnormalities. Left ventricular diastolic parameters are indeterminate.  2. Right ventricular systolic function is normal. The right ventricular size is normal. Tricuspid regurgitation signal is inadequate for assessing PA pressure.  3. Left atrial size was mildly dilated.  4. The mitral valve is normal in structure. No evidence of mitral valve regurgitation. No evidence of mitral stenosis.  5. The aortic valve is tricuspid. Aortic valve regurgitation is not visualized. No aortic stenosis is present.  6. There is borderline dilatation of the ascending aorta, measuring 38 mm.  7. The inferior vena cava is normal in size with greater than 50% respiratory variability, suggesting right atrial pressure of 3 mmHg. FINDINGS  Left Ventricle: Left ventricular ejection fraction, by estimation, is 55 to 60%. The left ventricle has normal function. The left ventricle has no regional wall motion abnormalities. The left ventricular internal cavity size was normal in size. There is  borderline concentric left  ventricular hypertrophy. Left ventricular diastolic parameters are indeterminate. Right Ventricle: The right ventricular size is normal. No increase in right ventricular wall  thickness. Right ventricular systolic function is normal. Tricuspid regurgitation signal is inadequate for assessing PA pressure. Left Atrium: Left atrial size was mildly dilated. Right Atrium: Right atrial size was normal in size. Pericardium: There is no evidence of pericardial effusion. Mitral Valve: The mitral valve is normal in structure. Mild to moderate mitral annular calcification. No evidence of mitral valve regurgitation. No evidence of mitral valve stenosis. Tricuspid Valve: The tricuspid valve is normal in structure. Tricuspid valve regurgitation is not demonstrated. Aortic Valve: The aortic valve is tricuspid. Aortic valve regurgitation is not visualized. No aortic stenosis is present. Aortic valve peak gradient measures 8.2 mmHg. Pulmonic Valve: The pulmonic valve was grossly normal. Pulmonic valve regurgitation is not visualized. Aorta: The aortic root is normal in size and structure. There is borderline dilatation of the ascending aorta, measuring 38 mm. Venous: The inferior vena cava is normal in size with greater than 50% respiratory variability, suggesting right atrial pressure of 3 mmHg. IAS/Shunts: No atrial level shunt detected by color flow Doppler.  LEFT VENTRICLE PLAX 2D LVIDd:         4.50 cm      Diastology LVIDs:         3.60 cm      LV e' medial:    6.20 cm/s LV PW:         1.20 cm      LV E/e' medial:  14.6 LV IVS:        1.10 cm      LV e' lateral:   8.05 cm/s LVOT diam:     2.10 cm      LV E/e' lateral: 11.2 LV SV:         91 LV SV Index:   36 LVOT Area:     3.46 cm  LV Volumes (MOD) LV vol d, MOD A2C: 172.0 ml LV vol d, MOD A4C: 155.0 ml LV vol s, MOD A2C: 69.4 ml LV vol s, MOD A4C: 68.1 ml LV SV MOD A2C:     102.6 ml LV SV MOD A4C:     155.0 ml LV SV MOD BP:      94.0 ml RIGHT VENTRICLE            IVC RV Basal  diam:  3.50 cm    IVC diam: 1.70 cm RV Mid diam:    2.20 cm RV S prime:     8.38 cm/s TAPSE (M-mode): 2.4 cm LEFT ATRIUM             Index        RIGHT ATRIUM           Index LA diam:        4.10 cm 1.61 cm/m   RA Area:     14.20 cm LA Vol (A2C):   94.5 ml 37.16 ml/m  RA Volume:   31.00 ml  12.19 ml/m LA Vol (A4C):   75.0 ml 29.49 ml/m LA Biplane Vol: 86.9 ml 34.17 ml/m  AORTIC VALVE AV Area (Vmax): 2.95 cm AV Vmax:        143.00 cm/s AV Peak Grad:   8.2 mmHg LVOT Vmax:      122.00 cm/s LVOT Vmean:     94.800 cm/s LVOT VTI:       0.263 m  AORTA Ao Root diam: 3.80 cm Ao Asc diam:  3.80 cm MITRAL VALVE MV Area (PHT): 3.81 cm    SHUNTS MV Decel Time: 199 msec    Systemic VTI:  0.26 m MV E velocity: 90.30 cm/s  Systemic Diam: 2.10 cm MV A velocity: 98.50 cm/s MV E/A ratio:  0.92 Mihai Croitoru MD Electronically signed by Sanda Klein MD Signature Date/Time: 09/29/2021/12:22:45 PM    Final    CT Angio Chest/Abd/Pel for Dissection W and/or Wo Contrast  Result Date: 09/28/2021 CLINICAL DATA:  Chest pain.  Shortness of breath. EXAM: CT ANGIOGRAPHY CHEST, ABDOMEN AND PELVIS TECHNIQUE: Non-contrast CT of the chest was initially obtained. Multidetector CT imaging through the chest, abdomen and pelvis was performed using the standard protocol during bolus administration of intravenous contrast. Multiplanar reconstructed images and MIPs were obtained and reviewed to evaluate the vascular anatomy. RADIATION DOSE REDUCTION: This exam was performed according to the departmental dose-optimization program which includes automated exposure control, adjustment of the mA and/or kV according to patient size and/or use of iterative reconstruction technique. CONTRAST:  127mL OMNIPAQUE IOHEXOL 350 MG/ML SOLN COMPARISON:  August 30, 2021. FINDINGS: CTA CHEST FINDINGS Cardiovascular: Preferential opacification of the thoracic aorta. No evidence of thoracic aortic aneurysm or dissection. Normal heart size. No pericardial effusion.  Mediastinum/Nodes: No enlarged mediastinal, hilar, or axillary lymph nodes. Thyroid gland, trachea, and esophagus demonstrate no significant findings. Lungs/Pleura: Lungs are clear. No pleural effusion or pneumothorax. Musculoskeletal: No chest wall abnormality. No acute or significant osseous findings. Review of the MIP images confirms the above findings. CTA ABDOMEN AND PELVIS FINDINGS VASCULAR Aorta: Normal caliber aorta without aneurysm, dissection, vasculitis or significant stenosis. Celiac: Patent without evidence of aneurysm, dissection, vasculitis or significant stenosis. SMA: Patent without evidence of aneurysm, dissection, vasculitis or significant stenosis. Renals: Both renal arteries are patent without evidence of aneurysm, dissection, vasculitis, fibromuscular dysplasia or significant stenosis. IMA: Patent without evidence of aneurysm, dissection, vasculitis or significant stenosis. Inflow: Patent without evidence of aneurysm, dissection, vasculitis or significant stenosis. Veins: No obvious venous abnormality within the limitations of this arterial phase study. Review of the MIP images confirms the above findings. NON-VASCULAR Hepatobiliary: No focal liver abnormality is seen. No gallstones, gallbladder wall thickening, or biliary dilatation. Pancreas: Unremarkable. No pancreatic ductal dilatation or surrounding inflammatory changes. Spleen: Normal in size without focal abnormality. Adrenals/Urinary Tract: Adrenal glands are unremarkable. Small nonobstructive left renal calculus is noted. No hydronephrosis or renal obstruction is noted. Bladder is unremarkable. Stomach/Bowel: Stomach is within normal limits. Appendix appears normal. No evidence of bowel wall thickening, distention, or inflammatory changes. Lymphatic: No adenopathy is noted. Reproductive: Prostate is unremarkable. Other: No abdominal wall hernia or abnormality. No abdominopelvic ascites. Musculoskeletal: No fracture is seen. Stable L3  sclerotic density is noted. Review of the MIP images confirms the above findings. IMPRESSION: No evidence of thoracic or abdominal aortic dissection or aneurysm. Small nonobstructive left renal calculus. No hydronephrosis or renal obstruction is noted. Electronically Signed   By: Marijo Conception M.D.   On: 09/28/2021 15:32    Microbiology: Results for orders placed or performed during the hospital encounter of 09/28/21  Resp Panel by RT-PCR (Flu A&B, Covid) Nasopharyngeal Swab     Status: None   Collection Time: 09/28/21 12:28 PM   Specimen: Nasopharyngeal Swab; Nasopharyngeal(NP) swabs in vial transport medium  Result Value Ref Range Status   SARS Coronavirus 2 by RT PCR NEGATIVE NEGATIVE Final    Comment: (NOTE) SARS-CoV-2 target nucleic acids are NOT DETECTED.  The SARS-CoV-2 RNA is generally detectable in upper respiratory specimens during the acute phase of infection. The lowest concentration of SARS-CoV-2 viral copies this assay can detect is 138 copies/mL. A negative  result does not preclude SARS-Cov-2 infection and should not be used as the sole basis for treatment or other patient management decisions. A negative result may occur with  improper specimen collection/handling, submission of specimen other than nasopharyngeal swab, presence of viral mutation(s) within the areas targeted by this assay, and inadequate number of viral copies(<138 copies/mL). A negative result must be combined with clinical observations, patient history, and epidemiological information. The expected result is Negative.  Fact Sheet for Patients:  EntrepreneurPulse.com.au  Fact Sheet for Healthcare Providers:  IncredibleEmployment.be  This test is no t yet approved or cleared by the [place] FDA and  has been authorized for detection and/or diagnosis of SARS-CoV-2 by FDA under an Emergency Use Authorization (EUA). This EUA will remain  in effect (meaning this  test can be used) for the duration of the COVID-19 declaration under Section 564(b)(1) of the Act, 21 U.S.C.section 360bbb-3(b)(1), unless the authorization is terminated  or revoked sooner.       Influenza A by PCR NEGATIVE NEGATIVE Final   Influenza B by PCR NEGATIVE NEGATIVE Final    Comment: (NOTE) The Xpert Xpress SARS-CoV-2/FLU/RSV plus assay is intended as an aid in the diagnosis of influenza from Nasopharyngeal swab specimens and should not be used as a sole basis for treatment. Nasal washings and aspirates are unacceptable for Xpert Xpress SARS-CoV-2/FLU/RSV testing.  Fact Sheet for Patients: EntrepreneurPulse.com.au  Fact Sheet for Healthcare Providers: IncredibleEmployment.be  This test is not yet approved or cleared by the [place] FDA and has been authorized for detection and/or diagnosis of SARS-CoV-2 by FDA under an Emergency Use Authorization (EUA). This EUA will remain in effect (meaning this test can be used) for the duration of the COVID-19 declaration under Section 564(b)(1) of the Act, 21 U.S.C. section 360bbb-3(b)(1), unless the authorization is terminated or revoked.  Performed at [facility], [address] [place], [place] [address]     Labs: CBC: Recent Labs  Lab 09/28/21 1218 09/29/21 0122  WBC 4.4 4.7  NEUTROABS 2.5 3.0  HGB 13.8 13.6  HCT 40.6 40.9  MCV 86.4 87.8  PLT 269 258   Basic Metabolic Panel: Recent Labs  Lab 09/28/21 1218 09/29/21 0122  NA 132* 133*  K 4.2 4.5  CL 100 100  CO2 23 23  GLUCOSE 239* 357*  BUN 21* 20  CREATININE 0.79 1.04  CALCIUM 8.9 9.2  MG  --  1.7   Liver Function Tests: Recent Labs  Lab 09/29/21 0122  AST 22  ALT 25  ALKPHOS 77  BILITOT 0.4  PROT 7.5  ALBUMIN 3.2*   CBG: Recent Labs  Lab 09/28/21 2105 09/28/21 2254 09/29/21 0631 09/29/21 1123  GLUCAP 212* 301* 215* 232*    Discharge time spent: greater than 30  minutes.  Signed: Suzie Vandam, DO [facility] 09/29/2021

## 2021-09-29 NOTE — Consult Note (Addendum)
Cardiology Consultation:   Patient ID: Tyler Brewer MRN: [id number]; DOB: 06-15-1961  Admit date: 09/28/2021 Date of Consult: 09/29/2021  PCP:  [name], DO   [facility] Cardiologist:  New (Dr. Stanford Breed) \ Patient Profile:   Tyler Brewer is a 61 y.o. male with a history of moderate luminal irregularities but no obstructive CAD noted on cardiac catheterization in 03/2012, hypertension, hyperlipidemia, type 2 diabetes on insulin, obstructive sleep apnea on CPAP, IBS, chronic inflammatory demyelinating polyneuropathy, chronic back pain, prior methamphetamine abuse, and anxiety who is being seen for the evaluation of chest pain at the request of Dr. Cyd Silence.  History of Present Illness:   Tyler Brewer is a 61 year old male with the above history.  Remote cardiac catheterization in 2013 showed mild to moderate luminal irregularities but no significant disease.  Last ischemic evaluation was a Myoview in 11/2015 was low risk with no evidence of ischemia.  Echo done showed LVEF of 55-60% with normal wall motion and grade 1 diastolic dysfunction.  Patient has not been seen by Cardiology since 2017.  Patient presented to the ED on 09/28/2021 EMS for further evaluation of chest pain and shortness of breath.  Patient has no a lot of chronic pain issues with his chronic back pain and CIDP.  However, he reports a different type of back pain recently that radiates to the front of his chest and is quite severe.  Pain is worse when taking a deep breath and reproducible with palpation of back and chest.  He denies any chest pain when ambulating with a walker.  He has chronic shortness of breath with exertion but reports more shortness of breath at rest in the last couple days.  He describes orthopnea and PND which he states is new as well as worsening lower extremity edema from baseline.  On day of presentation, he was sitting in his electric wheelchair when he became acutely short of  breath and stated that he felt like he could not catch his breath.  He was also having severe back pain that radiated to the front at this time as well as some pain that radiated to his left arm.  Reports occasional palpitations that last for a few minutes.  It sounds like these mostly occur when he is anxious, any acute pain, or acutely short of breath but he does note some episodes of this outside of the stockings.  He also describes some dizziness while at rest that sounds like vertigo.  No syncope.  No recent fevers or illnesses.  Reports some constipation but no other GI symptoms.  No abnormal bleeding in urine or stools.  In the ED, vitals stable. EKG showed normal sinus rhythm with no acute ischemic changes.  High-sensitivity troponin mildly elevated and flat at 128 >> 134 >> 139 >> 134.  BNP normal.  Chest x-ray showed no acute findings.  CTA showed no evidence of thoracic or abdominal dissection but did note a small nonobstructive left renal calculus. WBC 4.4, Hgb 13.8, Plts 269. Na 132, K 4.2, Glucose 239, BUN 21, Cr 0.79. Urinalysis showed elevated specific gravity and >/= 500 of glucose but was otherwise unremarkable. Lipase normal. Respiratory panel negative for COVID and influenza.  Patient was admitted and Cardiology was consulted for further evaluation.  Past Medical History:  Diagnosis Date   Arthritis    CAD (coronary artery disease)  cardiac cath with moderate disease in a septal branch of the ramus intermedius 04/01/2012   CIDP (chronic inflammatory demyelinating  polyneuropathy) ([place])    Depression    Diabetes mellitus    poorly controlled by his report   History of narcotic addiction ([place])    past history of back pain   Hypercholesteremia    Hypertension    IBS (irritable bowel syndrome)    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[place])    Neuropathy    Obesity    Max weight was 390   OSA on CPAP    Panic attacks    Testosterone deficiency    Vertigo     Past Surgical History:   Procedure Laterality Date   CARDIAC CATHETERIZATION     IR FLUORO GUIDE CV LINE RIGHT  11/10/2020   IR US GUIDE VASC ACCESS RIGHT  11/10/2020   LEFT HEART CATHETERIZATION WITH CORONARY ANGIOGRAM N/A 03/31/2012   Procedure: LEFT HEART CATHETERIZATION WITH CORONARY ANGIOGRAM;  Surgeon: Leonie Man, MD;  Location: [facility] CATH LAB;  Service: Cardiovascular;  Laterality: N/A;     Home Medications:  Prior to Admission medications   Medication Sig Start Date End Date Taking? Authorizing Provider  acetaminophen (TYLENOL) 500 MG tablet Take 500-1,000 mg by mouth every 6 (six) hours as needed for mild pain.    [provider]  AMBULATORY NON FORMULARY MEDICATION Medication Name: [facility] Wheelchair 07/31/21   Carollee Herter, Alferd Apa, DO  amLODipine (NORVASC) 10 MG tablet Take 1 tablet (10 mg total) by mouth daily. 08/24/21   [name], DO  COMFORT EZ INSULIN SYRINGE 31G X 5/16" 1 ML MISC  12/15/20   [provider]  Cyanocobalamin (VITAMIN B-12 PO) Take 1 tablet by mouth daily.    [provider]  diclofenac Sodium (VOLTAREN) 1 % GEL Apply 4 g topically 4 (four) times daily as needed (joint pain).    [provider]  DULoxetine (CYMBALTA) 60 MG capsule Take 1 capsule by mouth daily. 05/06/21   [provider]  hydrALAZINE (APRESOLINE) 50 MG tablet TAKE 1 TABLET(50 MG) BY MOUTH EVERY 8 HOURS 08/01/21   Carollee Herter, Alferd Apa, DO  hydrOXYzine (ATARAX) 25 MG tablet Take 1 tablet (25 mg total) by mouth every 6 (six) hours as needed for anxiety. 08/09/21   [name], DO  ibuprofen (ADVIL) 200 MG tablet Take 400 mg by mouth every 6 (six) hours as needed for mild pain.    [provider]  immune globulin, human, (GAMMAGARD S/D) 5 g injection Inject into the vein every 21 ( twenty-one) days.    [provider]  insulin aspart (NOVOLOG FLEXPEN) 100 UNIT/ML FlexPen Max daily 80 units per scale 08/24/21   [name], Melanie Crazier, MD   insulin degludec (TRESIBA FLEXTOUCH) 100 UNIT/ML FlexTouch Pen Inject 30 Units into the skin daily. 08/24/21   [name], Melanie Crazier, MD  Insulin Pen Needle 31G X 5 MM MISC 1 Device by Does not apply route in the morning, at noon, in the evening, and at bedtime. 08/24/21   [name], Melanie Crazier, MD  losartan (COZAAR) 100 MG tablet TAKE ONE TABLET BY MOUTH DAILY 04/18/21   Carollee Herter, Alferd Apa, DO  MAGNESIUM PO Take 1 tablet by mouth daily.    [provider]  meclizine (ANTIVERT) 25 MG tablet Take 1 tablet (25 mg total) by mouth 3 (three) times daily as needed for dizziness. 02/18/21   Molpus, John, MD  meloxicam (MOBIC) 15 MG tablet Take 1 tablet (15 mg total) by mouth daily. 03/14/21   Kirsteins, Luanna Salk, MD  Menthol, Topical Analgesic, (ICY  HOT EX) Apply 1 application topically as needed (pain).    [provider]  metFORMIN (GLUCOPHAGE-XR) 500 MG 24 hr tablet TAKE 2 TABLETS IN THE      MORNING AND AT BEDTIME 09/26/21   [name], Melanie Crazier, MD  methocarbamol (ROBAXIN) 500 MG tablet Take 500 mg by mouth 4 (four) times daily as needed for muscle spasms.    [provider]  Multiple Vitamin (MULTIVITAMIN) tablet Take 1 tablet by mouth daily.    [provider]  NONFORMULARY OR COMPOUNDED ITEM Power wheelchair 06/15/21   Carollee Herter, Alferd Apa, DO  NONFORMULARY OR COMPOUNDED [employer] wheelchair 08/04/21   Carollee Herter, Alferd Apa, DO  polyethylene glycol (MIRALAX / GLYCOLAX) 17 g packet Take 17 g by mouth daily as needed for mild constipation. 07/21/21   Davonna Belling, MD  pravastatin (PRAVACHOL) 40 MG tablet TAKE 1 TABLET DAILY 09/25/21   Carollee Herter, Alferd Apa, DO  sildenafil (VIAGRA) 100 MG tablet Take 0.5-1 tablets (50-100 mg total) by mouth daily as needed for erectile dysfunction. 07/24/21   [name] R, DO  tamsulosin (FLOMAX) 0.4 MG CAPS capsule TAKE ONE CAPSULE BY MOUTH ONE TIME DAILY 04/18/21   Carollee Herter, Alferd Apa, DO  torsemide  (DEMADEX) 20 MG tablet Take 0.5 tablets (10 mg total) by mouth daily. 2 po qd 09/22/21   Carollee Herter, Alferd Apa, DO  traMADol (ULTRAM) 50 MG tablet 2 po q6 h prn 09/08/21   [name], DO    Inpatient Medications: Scheduled Meds:  amLODipine  10 mg Oral Daily   aspirin  325 mg Oral Daily   DULoxetine  60 mg Oral Daily   enoxaparin (LOVENOX) injection  70 mg Subcutaneous Daily   furosemide  20 mg Oral BID   gabapentin  1,200 mg Oral TID   hydrALAZINE  50 mg Oral Q8H   insulin aspart  0-15 Units Subcutaneous TID AC & HS   insulin glargine-yfgn  30 Units Subcutaneous Daily   lidocaine  1 patch Transdermal Q24H   losartan  100 mg Oral Daily   meloxicam  15 mg Oral Daily   pravastatin  40 mg Oral QHS   tamsulosin  0.4 mg Oral Daily   Continuous Infusions:  lactated ringers 125 mL/hr at 09/29/21 0406   PRN Meds: acetaminophen **OR** acetaminophen, calcium carbonate, hydrOXYzine, methocarbamol, nitroGLYCERIN, ondansetron **OR** ondansetron (ZOFRAN) IV, polyethylene glycol, traMADol  Allergies:   No Known Allergies  Social History:   Social History   Socioeconomic History   Marital status: Single    Spouse name: Not on file   Number of children: 2   Years of education: Not on file   Highest education level: Not on file  Occupational History   Not on file  Tobacco Use   Smoking status: Former    Packs/day: 0.50    Types: Cigarettes    Quit date: 04/27/2021    Years since quitting: 0.4   Smokeless tobacco: Never  Vaping Use   Vaping Use: Never used  Substance and Sexual Activity   Alcohol use: No   Drug use: Not Currently    Types: Methamphetamines   Sexual activity: Yes  Other Topics Concern   Not on file  Social History Narrative     Has 2 grown children.     Works as a Barrister's clerk.  Education: Oceanographer.   Right Handed   Drinks Caffeine    Mother recently moved and sold her house-- pt is  living with a friend in a trailer    Social Determinants of Adult nurse Strain: Low Risk    Difficulty of Paying Living Expenses: Not very hard  Food Insecurity: No Food Insecurity   Worried About Charity fundraiser in the Last Year: Never true   Arboriculturist in the Last Year: Never true  Transportation Needs: Public librarian (Medical): Yes   Lack of Transportation (Non-Medical): Yes  Physical Activity: Insufficiently Active   Days of Exercise per Week: 2 days   Minutes of Exercise per Session: 30 min  Stress: Not on file  Social Connections: Not on file  Intimate Partner Violence: Not on file    Family History:   Family History  Problem Relation Age of Onset   Diabetes type II Father    Hypertension Father    Pancreatic disease Father 76       Deceased   Healthy Mother    Healthy Sister    Healthy Son    Healthy Daughter    Parkinson's disease Maternal Grandmother      ROS:  Please see the history of present illness.  Review of Systems  Constitutional:  Positive for chills. Negative for fever.  HENT:  Congestion: "nothing more than usual".   Respiratory:  Positive for shortness of breath. Cough: "nothing more than usual".  Cardiovascular:  Positive for chest pain, palpitations, orthopnea, leg swelling and PND.  Gastrointestinal:  Positive for constipation. Negative for abdominal pain, blood in stool, melena, nausea and vomiting.  Genitourinary:  Negative for hematuria.  Musculoskeletal:  Positive for back pain.  Neurological:  Positive for dizziness. Negative for loss of consciousness.  Endo/Heme/Allergies:  Does not bruise/bleed easily.  Psychiatric/Behavioral:  Positive for substance abuse.    Physical Exam/Data:   Vitals:   09/28/21 2044 09/28/21 2307 09/29/21 0456 09/29/21 0825  BP: 136/68 (!) 163/91 (!) 150/96 (!) 142/96  Pulse: 82 87 95 83  Resp: 16 20 (!) 21 16  Temp: 98.2 F (36.8 C) 97.9 F (36.6 C) 98.3 F (36.8 C) 98.4 F (36.9 C)  TempSrc: Oral Oral Oral  Oral  SpO2: 100% 97% 94% 95%  Weight: (!) 141 kg     Height: 5\' 11"  (1.803 m)       Intake/Output Summary (Last 24 hours) at 09/29/2021 0925 Last data filed at 09/29/2021 0518 Gross per 24 hour  Intake 394.09 ml  Output 875 ml  Net -480.91 ml   Last 3 Weights 09/28/2021 09/28/2021 09/08/2021  Weight (lbs) 310 lb 13.6 oz 317 lb (No Data)  Weight (kg) 141 kg 143.79 kg (No Data)  Some encounter information is confidential and restricted. Go to Review Flowsheets activity to see all data.     Body mass index is 43.35 kg/m.  General: 61 y.o. obese Caucasian male resting comfortably in no acute distress. HEENT: Normocephalic and atraumatic. Sclera clear.  Neck: Supple. No carotid bruits. No JVD. Heart: RRR. Distinct S1 and S2. No murmurs, gallops, or rubs. Reproducible pain with palpation of chest and back. Lungs: No increased work of breathing. Clear to ausculation bilaterally. No wheezes, rhonchi, or rales.  Abdomen: Soft, non-distended, and non-tender to palpation.  MSK: Normal strength and tone for age. Extremities: Trace lower extremity edema (right > left).    Skin: Warm and dry. Neuro: Alert and oriented x3. No focal deficits. Psych: Normal affect. Responds appropriately.  EKG:  The EKG was personally reviewed and demonstrates:  Normal sinus rhythm, rate 88 bpm, with no acute ST/T changes.  Borderline first-degree AV block.  QTc 429 ms.  Telemetry:  Telemetry was personally reviewed and demonstrates: Normal sinus rhythm with rate in the 80s.  Occasional PVCs noted.  Relevant CV Studies:  Echocardiogram 12/14/2015: Study Conclusions: - Left ventricle: The cavity size was normal. Wall thickness was    normal. Systolic function was normal. The estimated ejection    fraction was in the range of 55% to 60%. Wall motion was normal;    there were no regional wall motion abnormalities. Doppler    parameters are consistent with abnormal left ventricular    relaxation (grade 1 diastolic  dysfunction).  - Left atrium: The atrium was mildly dilated.   Laboratory Data:  High Sensitivity Troponin:   Recent Labs  Lab 09/28/21 1228 09/28/21 1432 09/28/21 1615 09/28/21 1815  TROPONINIHS 128* 134* 139* 134*     Chemistry Recent Labs  Lab 09/28/21 1218 09/29/21 0122  NA 132* 133*  K 4.2 4.5  CL 100 100  CO2 23 23  GLUCOSE 239* 357*  BUN 21* 20  CREATININE 0.79 1.04  CALCIUM 8.9 9.2  MG  --  1.7  GFRNONAA >60 >60  ANIONGAP 9 10    Recent Labs  Lab 09/29/21 0122  PROT 7.5  ALBUMIN 3.2*  AST 22  ALT 25  ALKPHOS 77  BILITOT 0.4   Lipids No results for input(s): CHOL, TRIG, HDL, LABVLDL, LDLCALC, CHOLHDL in the last 168 hours.  Hematology Recent Labs  Lab 09/28/21 1218 09/29/21 0122  WBC 4.4 4.7  RBC 4.70 4.66  HGB 13.8 13.6  HCT 40.6 40.9  MCV 86.4 87.8  MCH 29.4 29.2  MCHC 34.0 33.3  RDW 12.9 13.0  PLT 269 231   Thyroid No results for input(s): TSH, FREET4 in the last 168 hours.  BNP Recent Labs  Lab 09/28/21 1218  BNP 18.6    DDimer No results for input(s): DDIMER in the last 168 hours.   Radiology/Studies:  DG Chest Portable 1 View  Result Date: 09/28/2021 CLINICAL DATA:  Shortness of breath.  Chest pain. EXAM: PORTABLE CHEST 1 VIEW COMPARISON:  09/08/2021 FINDINGS: Cardiac silhouette is at the upper limits of size for AP technique. Mediastinal contours are within normal limits. The lungs are clear. No pleural effusion or pneumothorax. Moderate multilevel degenerative disc changes of the thoracic spine. IMPRESSION: No active disease. Electronically Signed   By: Yvonne Kendall M.D.   On: 09/28/2021 12:59   CT Angio Chest/Abd/Pel for Dissection W and/or Wo Contrast  Result Date: 09/28/2021 CLINICAL DATA:  Chest pain.  Shortness of breath. EXAM: CT ANGIOGRAPHY CHEST, ABDOMEN AND PELVIS TECHNIQUE: Non-contrast CT of the chest was initially obtained. Multidetector CT imaging through the chest, abdomen and pelvis was performed using the standard  protocol during bolus administration of intravenous contrast. Multiplanar reconstructed images and MIPs were obtained and reviewed to evaluate the vascular anatomy. RADIATION DOSE REDUCTION: This exam was performed according to the departmental dose-optimization program which includes automated exposure control, adjustment of the mA and/or kV according to patient size and/or use of iterative reconstruction technique. CONTRAST:  159mL OMNIPAQUE IOHEXOL 350 MG/ML SOLN COMPARISON:  August 30, 2021. FINDINGS: CTA CHEST FINDINGS Cardiovascular: Preferential opacification of the thoracic aorta. No evidence of thoracic aortic aneurysm or dissection. Normal heart size. No pericardial effusion. Mediastinum/Nodes: No enlarged mediastinal, hilar, or axillary lymph nodes. Thyroid gland, trachea, and esophagus demonstrate no significant findings. Lungs/Pleura: Lungs are clear. No  pleural effusion or pneumothorax. Musculoskeletal: No chest wall abnormality. No acute or significant osseous findings. Review of the MIP images confirms the above findings. CTA ABDOMEN AND PELVIS FINDINGS VASCULAR Aorta: Normal caliber aorta without aneurysm, dissection, vasculitis or significant stenosis. Celiac: Patent without evidence of aneurysm, dissection, vasculitis or significant stenosis. SMA: Patent without evidence of aneurysm, dissection, vasculitis or significant stenosis. Renals: Both renal arteries are patent without evidence of aneurysm, dissection, vasculitis, fibromuscular dysplasia or significant stenosis. IMA: Patent without evidence of aneurysm, dissection, vasculitis or significant stenosis. Inflow: Patent without evidence of aneurysm, dissection, vasculitis or significant stenosis. Veins: No obvious venous abnormality within the limitations of this arterial phase study. Review of the MIP images confirms the above findings. NON-VASCULAR Hepatobiliary: No focal liver abnormality is seen. No gallstones, gallbladder wall thickening,  or biliary dilatation. Pancreas: Unremarkable. No pancreatic ductal dilatation or surrounding inflammatory changes. Spleen: Normal in size without focal abnormality. Adrenals/Urinary Tract: Adrenal glands are unremarkable. Small nonobstructive left renal calculus is noted. No hydronephrosis or renal obstruction is noted. Bladder is unremarkable. Stomach/Bowel: Stomach is within normal limits. Appendix appears normal. No evidence of bowel wall thickening, distention, or inflammatory changes. Lymphatic: No adenopathy is noted. Reproductive: Prostate is unremarkable. Other: No abdominal wall hernia or abnormality. No abdominopelvic ascites. Musculoskeletal: No fracture is seen. Stable L3 sclerotic density is noted. Review of the MIP images confirms the above findings. IMPRESSION: No evidence of thoracic or abdominal aortic dissection or aneurysm. Small nonobstructive left renal calculus. No hydronephrosis or renal obstruction is noted. Electronically Signed   By: Marijo Conception M.D.   On: 09/28/2021 15:32     Assessment and Plan:   Chest/Back Pain Elevated Troponin Patient with history of moderate luminal  irregularities on cardiac cath in 2013.  Patient presents with atypical chest pain that starts in his back and radiates to the front of his chest.  It is worse with deep inspiration and reproducible with palpation on exam.  EKG shows no acute ischemic changes.  High-sensitivity troponin mildly elevated and flat in the 120s to 130s not consistent with ACS. CTA negative for dissection. - Still having atypical chest pain at time of exam. - Echo pending. - Patient currently has aspirin 25 mg daily ordered.  We will change this to 81 mg daily.  Continue home statin. -Chest pain sounds very atypical and not cardiac in nature.  However, he has multiple CV risk factors including hypertension, hyperlipidemia, poorly controlled diabetes, recent smoking history, and family history with his father having an MI in his  22s.  We will discuss additional ischemic evaluation with MD (Myoview versus coronary CTA).  Dyspnea Patient also presents with worsening dyspnea lately.  He has chronic dyspnea on exertion which is likely due to body habitus and deconditioning but reports worsening shortness of breath at rest lately.  He also describes orthopnea/PND and worsening lower extremity edema.  BNP normal.  Chest x-ray shows no signs of edema. - Does not appear grossly volume overloaded on exam.  He is able to lay almost completely flat without any problems. - Echo pending. - He is on Torsemide 10 mg daily at home started on Lasix 20 mg twice daily here.  Okay to continue. - Suspect dyspnea at night is due to untreated sleep apnea.  He states his CPAP machine is broken as a his PCP is trying to get him set up for repeat sleep study. - May need to consider specifically ruling out PE given dyspnea, pleuritic chest pain,  and the fact that he is very sedentary (although he is not tachycardic or hypoxic).  Hypertension BP mildly elevated at times.  Most recent BP 142/96 but this was before morning meds. - Continue home medications: Amlodipine 10 mg daily, Hydralazine 50 mg 3 times daily, Losartan 100 mg daily.   Hyperlipidemia LDL 63 in 04/2021. - Continue home Pravastatin 40mg  daily.  Type 2 Diabetes  Hemoglobin A1c 8.5 this admission. - Management per primary team.  Otherwise, per primary team: - CIDP - Chronic back pain - BPH - Anxiety  Risk Assessment/Risk Scores:   HEAR Score (for undifferentiated chest pain):  HEAR Score: 3{   For questions or updates, please contact [place] Please consult [email or web address] for contact info under    Signed, Darreld Mclean, PA-C  09/29/2021 9:25 AM As above, patient seen and examined.  Briefly he is a 61 year old male with past medical history of hypertension, hyperlipidemia, diabetes mellitus, obstructive sleep apnea, irritable bowel syndrome, CIDP, prior  methamphetamine abuse for evaluation of chest pain.  Patient states he has had dyspnea intermittently for several weeks.  He also complains of pain in his left flank radiating to his left rib cage.  It increases with inspiration.  It has been essentially continuous for the past 3 weeks.  His symptoms worsened yesterday morning and he was admitted.  Troponin minimally elevated and cardiology asked to evaluate.  On examination his pain is reproduced with palpation. Creatinine 1.04, hemoglobin 13.6, troponin 128, 134, 139 and 134.  Electrocardiogram shows sinus rhythm with no ST changes.  1 chest pain-symptoms are atypical and likely musculoskeletal in etiology.  Troponin minimally elevated but no clear trend and not consistent with acute coronary syndrome.  Electrocardiogram shows no ST changes.  We will await results of echocardiogram.  If normal LV function patient can be discharged and we will arrange outpatient cardiac CTA (patient has long history of diabetes mellitus and worsening dyspnea with activities).  2 hypertension-continue preadmission blood pressure medications.  We will follow as an outpatient and advance if needed.  3 hyperlipidemia-continue statin  4 dyspnea-etiology unclear.  He does not appear to be significantly volume overloaded on examination.  CTA showed no pulmonary embolus.  Plan CTA to rule out obstructive coronary disease.  Note echocardiogram is pending to assess LV function.  Tyler Ruths, MD

## 2021-09-29 NOTE — Discharge Instructions (Addendum)
° °  Your cardiac CT will be scheduled at one of the below locations:   [facility] [address] [place], [place] [address] [phone]  [place] [address] [place], [place] [address] [phone]  If scheduled at [facility], please arrive at the [facility] main entrance (entrance A) of [facility] 30 minutes prior to test start time. You can use the FREE valet parking offered at the main entrance (encouraged to control the heart rate for the test) Proceed to the [facility] Radiology Department (first floor) to check-in and test prep.  If scheduled at [facility], please arrive 15 mins early for check-in and test prep.  Please follow these instructions carefully (unless otherwise directed):  Hold all erectile dysfunction medications at least 3 days (72 hrs) prior to test.  On the Night Before the Test: Be sure to Drink plenty of water. Do not consume any caffeinated/decaffeinated beverages or chocolate 12 hours prior to your test. Do not take any antihistamines 12 hours prior to your test. If the patient has contrast allergy: Patient will need a prescription for Prednisone and very clear instructions (as follows): Prednisone 50 mg - take 13 hours prior to test Take another Prednisone 50 mg 7 hours prior to test Take another Prednisone 50 mg 1 hour prior to test Take Benadryl 50 mg 1 hour prior to test Patient must complete all four doses of above prophylactic medications. Patient will need a ride after test due to Benadryl.  On the Day of the Test: Drink plenty of water until 1 hour prior to the test. Do not eat any food 4 hours prior to the test. You may take your regular medications prior to the test.  Take metoprolol (Lopressor) two hours prior to test. HOLD Furosemide/Hydrochlorothiazide morning of the test. FEMALES- please wear underwire-free bra if  available, avoid dresses & tight clothing       After the Test: Drink plenty of water. After receiving IV contrast, you may experience a mild flushed feeling. This is normal. On occasion, you may experience a mild rash up to 24 hours after the test. This is not dangerous. If this occurs, you can take Benadryl 25 mg and increase your fluid intake. If you experience trouble breathing, this can be serious. If it is severe call 911 IMMEDIATELY. If it is mild, please call our office. If you take any of these medications: Glipizide/Metformin, Avandament, Glucavance, please do not take 48 hours after completing test unless otherwise instructed.  We will call to schedule your test 2-4 weeks out understanding that some insurance companies will need an authorization prior to the service being performed.   For non-scheduling related questions, please contact the cardiac imaging nurse navigator should you have any questions/concerns: Marchia Bond, Cardiac Imaging Nurse Navigator Gordy Clement, Cardiac Imaging Nurse Navigator [facility] Direct Office Dial: [phone]   For scheduling needs, including cancellations and rescheduling, please call [place], [phone].

## 2021-09-29 NOTE — Progress Notes (Signed)
Ordered outpatient coronary CTA. Please see consult note from today for more information.  Darreld Mclean, PA-C 09/29/2021 2:07 PM

## 2021-10-02 ENCOUNTER — Telehealth: Payer: Self-pay | Admitting: Family Medicine

## 2021-10-02 DIAGNOSIS — R079 Chest pain, unspecified: Secondary | ICD-10-CM | POA: Diagnosis not present

## 2021-10-02 DIAGNOSIS — R072 Precordial pain: Secondary | ICD-10-CM | POA: Diagnosis not present

## 2021-10-02 DIAGNOSIS — R0602 Shortness of breath: Secondary | ICD-10-CM | POA: Diagnosis not present

## 2021-10-02 DIAGNOSIS — Z20822 Contact with and (suspected) exposure to covid-19: Secondary | ICD-10-CM | POA: Diagnosis not present

## 2021-10-02 DIAGNOSIS — R059 Cough, unspecified: Secondary | ICD-10-CM | POA: Diagnosis not present

## 2021-10-02 DIAGNOSIS — I10 Essential (primary) hypertension: Secondary | ICD-10-CM | POA: Diagnosis not present

## 2021-10-02 DIAGNOSIS — Z743 Need for continuous supervision: Secondary | ICD-10-CM | POA: Diagnosis not present

## 2021-10-02 NOTE — Telephone Encounter (Signed)
Kaylee calling from [facility] [phone], extension [phone].  Pt is scheduled for a Echocardiogram on 2/15 and prior auth is needed through [facility].   Please advise.

## 2021-10-02 NOTE — Telephone Encounter (Signed)
Donnetta Simpers stated she found out that authorization is not required since they are in network.

## 2021-10-03 ENCOUNTER — Telehealth: Payer: Self-pay | Admitting: *Deleted

## 2021-10-03 NOTE — Chronic Care Management (AMB) (Signed)
°  Care Management   Note  10/03/2021 Name: Tyler Brewer MRN: [id number] DOB: 1961-06-08  Tyler Brewer is a 61 y.o. year old male who is a primary care patient of [name], DO and is actively engaged with the care management team. I reached out to Thomasenia Bottoms by phone today to assist with re-scheduling a follow up visit with the RN Case Manager  Follow up plan: Telephone appointment with care management team member scheduled for: 10/30/2021  Julian Hy, [place], [place] Management  Direct Dial: [phone]

## 2021-10-04 ENCOUNTER — Ambulatory Visit (HOSPITAL_BASED_OUTPATIENT_CLINIC_OR_DEPARTMENT_OTHER): Payer: Medicare HMO

## 2021-10-04 ENCOUNTER — Encounter: Payer: Self-pay | Admitting: Family Medicine

## 2021-10-04 ENCOUNTER — Other Ambulatory Visit: Payer: Self-pay | Admitting: Family Medicine

## 2021-10-04 DIAGNOSIS — R0600 Dyspnea, unspecified: Secondary | ICD-10-CM | POA: Diagnosis not present

## 2021-10-04 DIAGNOSIS — R29898 Other symptoms and signs involving the musculoskeletal system: Secondary | ICD-10-CM | POA: Diagnosis not present

## 2021-10-04 DIAGNOSIS — M546 Pain in thoracic spine: Secondary | ICD-10-CM | POA: Diagnosis not present

## 2021-10-04 DIAGNOSIS — Z794 Long term (current) use of insulin: Secondary | ICD-10-CM | POA: Diagnosis not present

## 2021-10-04 DIAGNOSIS — E1142 Type 2 diabetes mellitus with diabetic polyneuropathy: Secondary | ICD-10-CM | POA: Diagnosis not present

## 2021-10-04 DIAGNOSIS — G6181 Chronic inflammatory demyelinating polyneuritis: Secondary | ICD-10-CM | POA: Diagnosis not present

## 2021-10-04 DIAGNOSIS — Z7984 Long term (current) use of oral hypoglycemic drugs: Secondary | ICD-10-CM | POA: Diagnosis not present

## 2021-10-04 DIAGNOSIS — Z79899 Other long term (current) drug therapy: Secondary | ICD-10-CM | POA: Diagnosis not present

## 2021-10-04 NOTE — Telephone Encounter (Signed)
FYI

## 2021-10-09 ENCOUNTER — Emergency Department (HOSPITAL_BASED_OUTPATIENT_CLINIC_OR_DEPARTMENT_OTHER)
Admission: EM | Admit: 2021-10-09 | Discharge: 2021-10-09 | Disposition: A | Discharge status: Home / Self Care | Payer: Medicare HMO | Attending: Emergency Medicine | Admitting: Emergency Medicine

## 2021-10-09 ENCOUNTER — Emergency Department (HOSPITAL_BASED_OUTPATIENT_CLINIC_OR_DEPARTMENT_OTHER): Payer: Medicare HMO

## 2021-10-09 ENCOUNTER — Encounter (HOSPITAL_BASED_OUTPATIENT_CLINIC_OR_DEPARTMENT_OTHER): Payer: Self-pay | Admitting: Emergency Medicine

## 2021-10-09 ENCOUNTER — Other Ambulatory Visit: Payer: Self-pay

## 2021-10-09 DIAGNOSIS — R739 Hyperglycemia, unspecified: Secondary | ICD-10-CM | POA: Diagnosis not present

## 2021-10-09 DIAGNOSIS — Z794 Long term (current) use of insulin: Secondary | ICD-10-CM | POA: Insufficient documentation

## 2021-10-09 DIAGNOSIS — E119 Type 2 diabetes mellitus without complications: Secondary | ICD-10-CM | POA: Diagnosis not present

## 2021-10-09 DIAGNOSIS — Z79899 Other long term (current) drug therapy: Secondary | ICD-10-CM | POA: Insufficient documentation

## 2021-10-09 DIAGNOSIS — I10 Essential (primary) hypertension: Secondary | ICD-10-CM | POA: Diagnosis not present

## 2021-10-09 DIAGNOSIS — R109 Unspecified abdominal pain: Secondary | ICD-10-CM | POA: Diagnosis not present

## 2021-10-09 DIAGNOSIS — R079 Chest pain, unspecified: Secondary | ICD-10-CM | POA: Insufficient documentation

## 2021-10-09 DIAGNOSIS — Z20822 Contact with and (suspected) exposure to covid-19: Secondary | ICD-10-CM | POA: Diagnosis not present

## 2021-10-09 DIAGNOSIS — I700 Atherosclerosis of aorta: Secondary | ICD-10-CM | POA: Diagnosis not present

## 2021-10-09 DIAGNOSIS — R1084 Generalized abdominal pain: Secondary | ICD-10-CM | POA: Diagnosis not present

## 2021-10-09 DIAGNOSIS — Z7984 Long term (current) use of oral hypoglycemic drugs: Secondary | ICD-10-CM | POA: Insufficient documentation

## 2021-10-09 DIAGNOSIS — R1012 Left upper quadrant pain: Secondary | ICD-10-CM | POA: Diagnosis not present

## 2021-10-09 DIAGNOSIS — I2510 Atherosclerotic heart disease of native coronary artery without angina pectoris: Secondary | ICD-10-CM | POA: Diagnosis not present

## 2021-10-09 DIAGNOSIS — Z7982 Long term (current) use of aspirin: Secondary | ICD-10-CM | POA: Diagnosis not present

## 2021-10-09 DIAGNOSIS — Z743 Need for continuous supervision: Secondary | ICD-10-CM | POA: Diagnosis not present

## 2021-10-09 LAB — CBC WITH DIFFERENTIAL/PLATELET
Abs Immature Granulocytes: 0.01 10*3/uL (ref 0.00–0.07)
Basophils Absolute: 0.1 10*3/uL (ref 0.0–0.1)
Basophils Relative: 1 %
Eosinophils Absolute: 0.3 10*3/uL (ref 0.0–0.5)
Eosinophils Relative: 5 %
HCT: 40.4 % (ref 39.0–52.0)
Hemoglobin: 13.7 g/dL (ref 13.0–17.0)
Immature Granulocytes: 0 %
Lymphocytes Relative: 26 %
Lymphs Abs: 1.5 10*3/uL (ref 0.7–4.0)
MCH: 29.5 pg (ref 26.0–34.0)
MCHC: 33.9 g/dL (ref 30.0–36.0)
MCV: 87.1 fL (ref 80.0–100.0)
Monocytes Absolute: 0.6 10*3/uL (ref 0.1–1.0)
Monocytes Relative: 10 %
Neutro Abs: 3.2 10*3/uL (ref 1.7–7.7)
Neutrophils Relative %: 58 %
Platelets: 276 10*3/uL (ref 150–400)
RBC: 4.64 MIL/uL (ref 4.22–5.81)
RDW: 12.8 % (ref 11.5–15.5)
WBC: 5.6 10*3/uL (ref 4.0–10.5)
nRBC: 0 % (ref 0.0–0.2)

## 2021-10-09 LAB — URINALYSIS, ROUTINE W REFLEX MICROSCOPIC
Bilirubin Urine: NEGATIVE
Bilirubin Urine: NEGATIVE
Glucose, UA: NEGATIVE mg/dL
Glucose, UA: 100 mg/dL — AB
Hgb urine dipstick: NEGATIVE
Ketones, ur: NEGATIVE mg/dL
Ketones, ur: NEGATIVE mg/dL
Leukocytes,Ua: NEGATIVE
Leukocytes,Ua: NEGATIVE
Nitrite: NEGATIVE
Nitrite: NEGATIVE
Protein, ur: NEGATIVE mg/dL
Protein, ur: NEGATIVE mg/dL
Specific Gravity, Urine: 1.015 (ref 1.005–1.030)
Specific Gravity, Urine: <=1.005 (ref 1.005–1.030)
pH: 6 (ref 5.0–8.0)
pH: 5 (ref 5.0–8.0)

## 2021-10-09 LAB — COMPREHENSIVE METABOLIC PANEL
ALT: 24 U/L (ref 0–44)
AST: 19 U/L (ref 15–41)
Albumin: 3.6 g/dL (ref 3.5–5.0)
Alkaline Phosphatase: 69 U/L (ref 38–126)
Anion gap: 7 (ref 5–15)
BUN: 17 mg/dL (ref 6–20)
CO2: 25 mmol/L (ref 22–32)
Calcium: 8.9 mg/dL (ref 8.9–10.3)
Chloride: 102 mmol/L (ref 98–111)
Creatinine, Ser: 0.82 mg/dL (ref 0.61–1.24)
GFR, Estimated: >60 mL/min (ref >60)
Glucose, Bld: 122 mg/dL — ABNORMAL HIGH (ref 70–99)
Potassium: 3.9 mmol/L (ref 3.5–5.1)
Sodium: 134 mmol/L — ABNORMAL LOW (ref 135–145)
Total Bilirubin: 0.3 mg/dL (ref 0.3–1.2)
Total Protein: 6.9 g/dL (ref 6.5–8.1)

## 2021-10-09 LAB — URINALYSIS, MICROSCOPIC (REFLEX)

## 2021-10-09 LAB — TROPONIN I (HIGH SENSITIVITY)
Troponin I (High Sensitivity): 8 ng/L (ref <18)
Troponin I (High Sensitivity): 8 ng/L (ref <18)

## 2021-10-09 LAB — RESP PANEL BY RT-PCR (FLU A&B, COVID) ARPGX2
Influenza A by PCR: NEGATIVE
Influenza B by PCR: NEGATIVE
SARS Coronavirus 2 by RT PCR: NEGATIVE

## 2021-10-09 LAB — LIPASE, BLOOD: Lipase: 39 U/L (ref 11–51)

## 2021-10-09 IMAGING — CT CT ABD-PELV W/ CM
2 of 5 series · 16 of 46 positions shown, 18 images · IV contrast (Omnipaque)
Comparison: [DATE]

CLINICAL DATA: Left upper quadrant abdomen pain for 3 days.

EXAM:
CT ABDOMEN AND PELVIS WITH CONTRAST
TECHNIQUE: Multidetector CT imaging of the abdomen and pelvis was performed
using the standard protocol following bolus administration of
intravenous contrast.

[Series 2: axial st · axial · 0.98mm/px · z∈[-562,-46]mm · 13 of 117 slices shown, 15 images]
[im 7/117  soft-tissue]
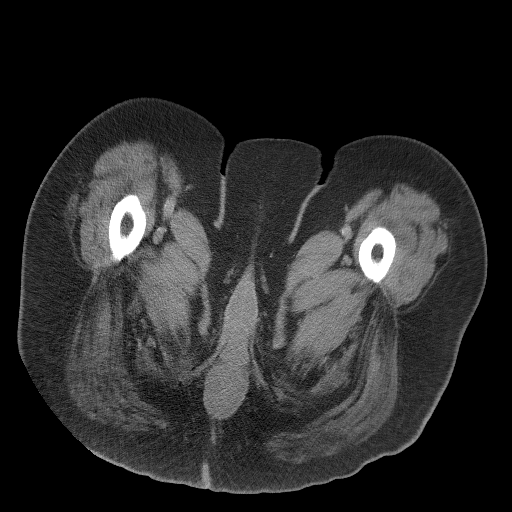
[im 7/117  bone]
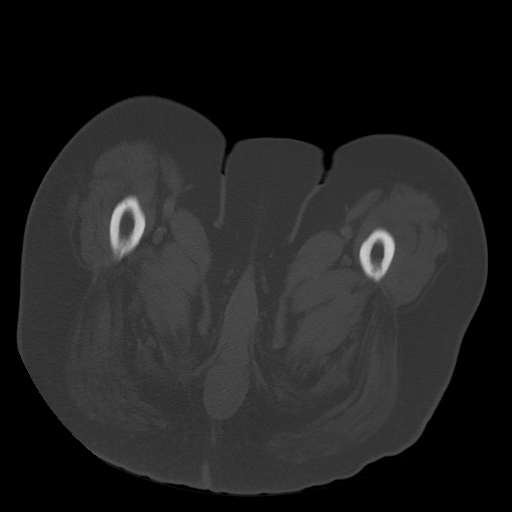
[im 19/117  soft-tissue]
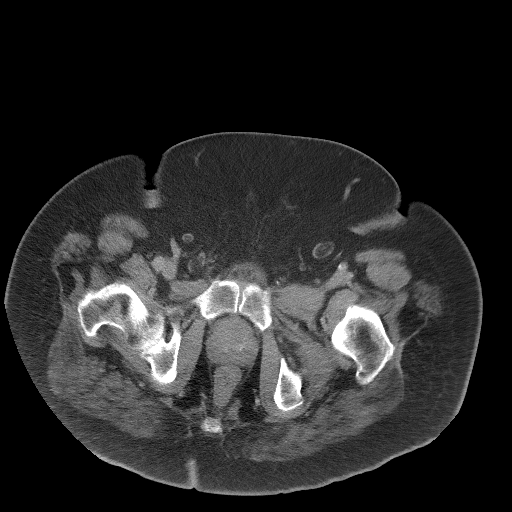
[im 25/117  soft-tissue]
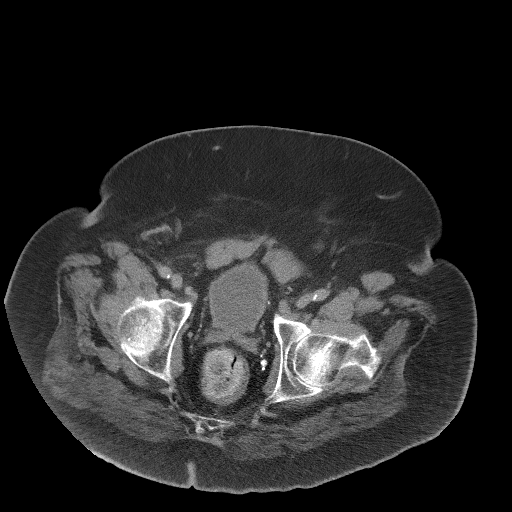
[im 31/117  soft-tissue]
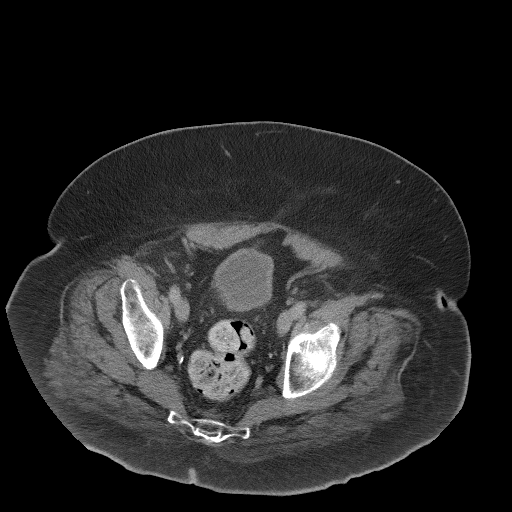
[im 43/117  soft-tissue]
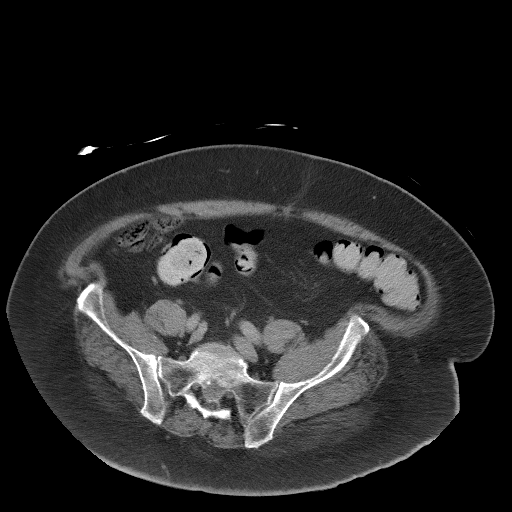
[im 49/117  soft-tissue]
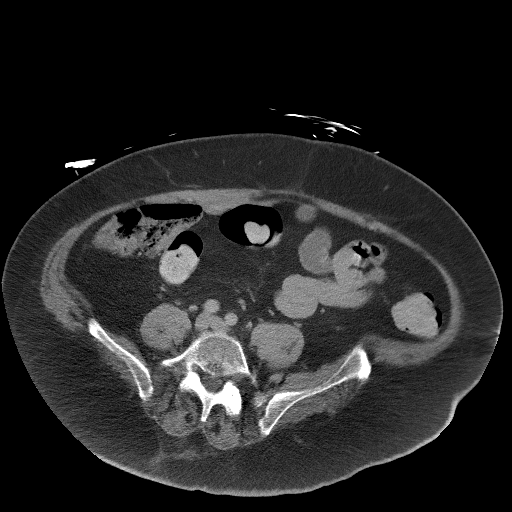
[im 62/117  soft-tissue]
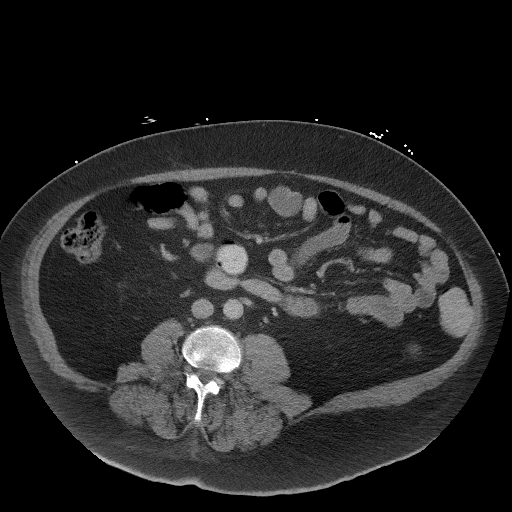
[im 68/117  soft-tissue]
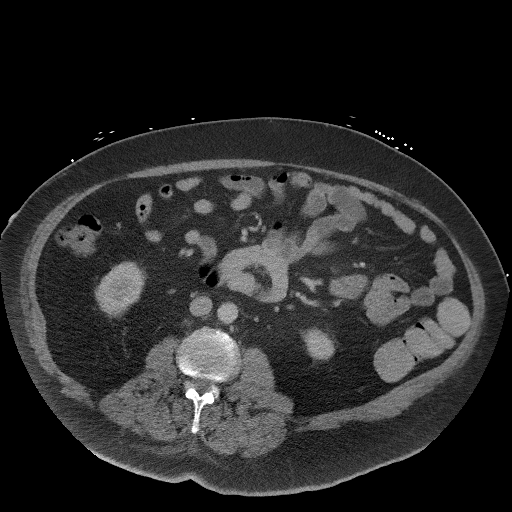
[im 74/117  soft-tissue]
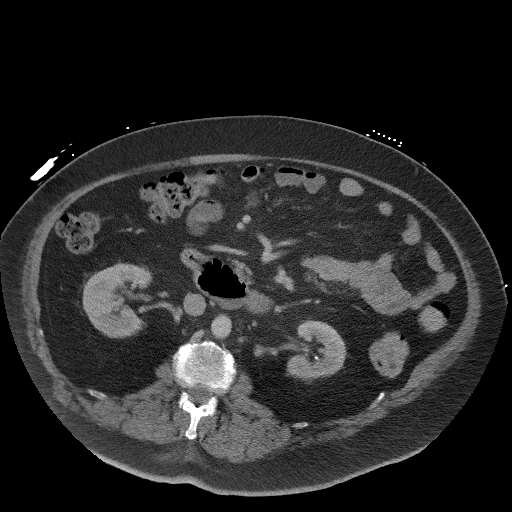
[im 74/117  bone]
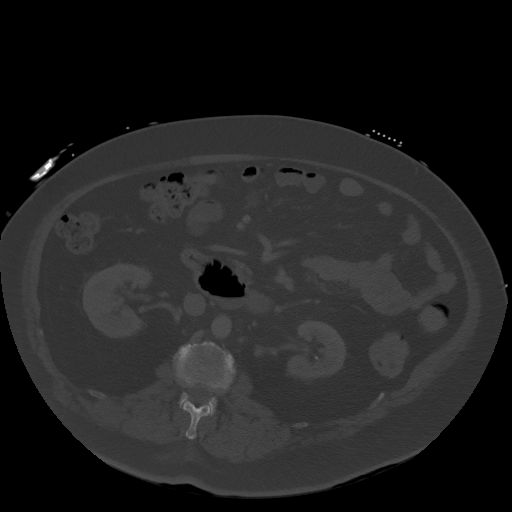
[im 86/117  soft-tissue]
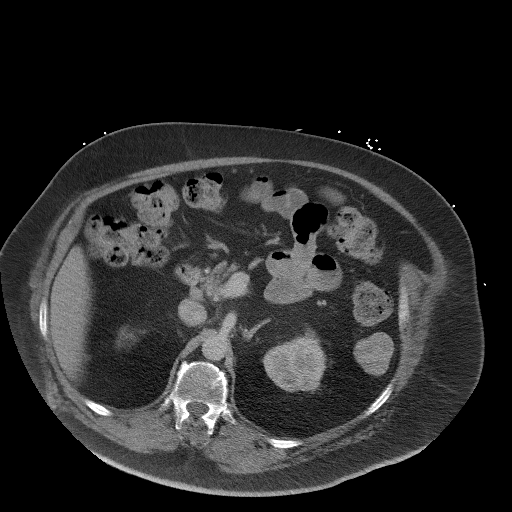
[im 92/117  soft-tissue]
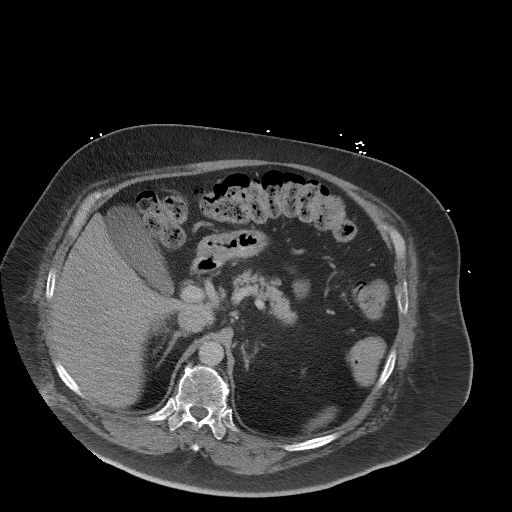
[im 98/117  soft-tissue]
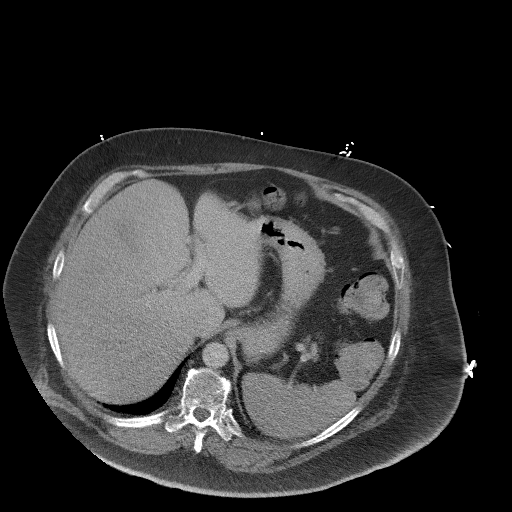
[im 110/117  soft-tissue]
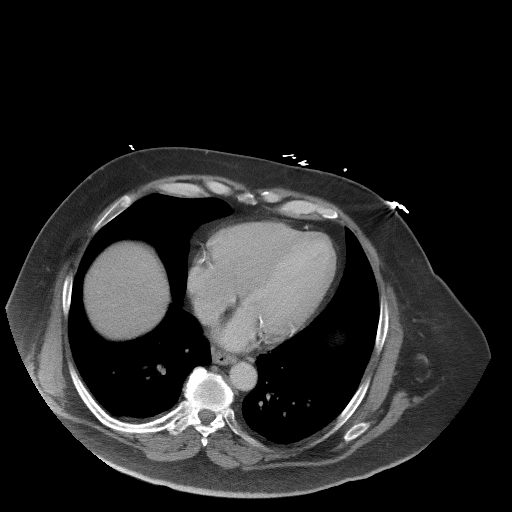

[Series 5: coronal st · coronal · 0.95mm/px · 3 of 129 slices shown]
[im 43/129  soft-tissue]
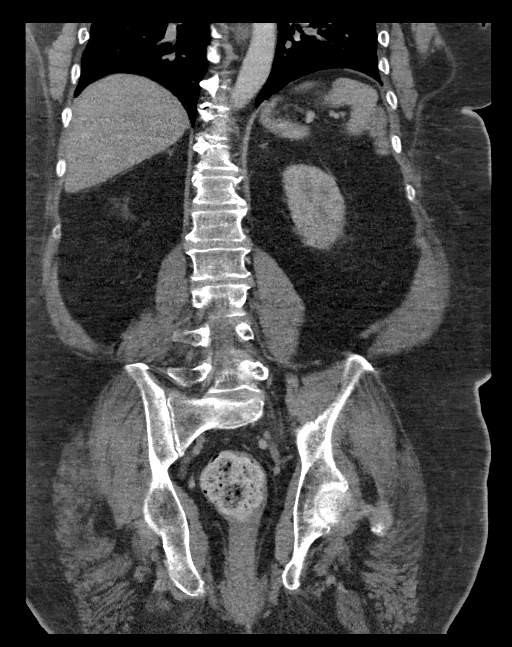
[im 57/129  soft-tissue]
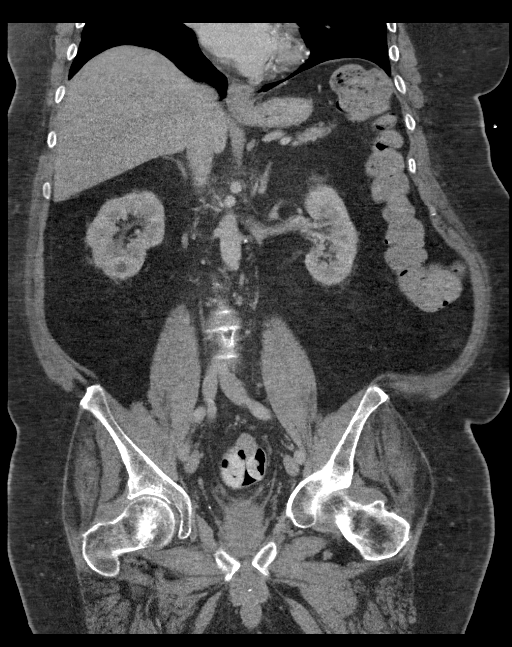
[im 72/129  soft-tissue]
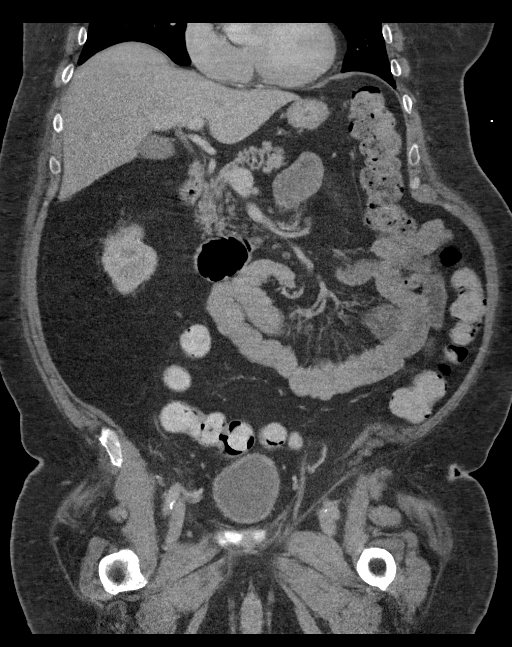

[16 of 46 positions shown; findings below may reference images not displayed]

RADIATION DOSE REDUCTION: This exam was performed according to the
departmental dose-optimization program which includes automated
exposure control, adjustment of the mA and/or kV according to
patient size and/or use of iterative reconstruction technique.

CONTRAST:  100mL OMNIPAQUE IOHEXOL 300 MG/ML  SOLN
FINDINGS: Lower chest: Mild atelectasis of posterior lung bases are
identified. The heart size is upper limits of normal.

Hepatobiliary: Small calcified granuloma is identified in the right
lobe liver. The liver is otherwise unremarkable. The gallbladder and
biliary tree are normal.

Pancreas: Unremarkable. No pancreatic ductal dilatation or
surrounding inflammatory changes.

Spleen: Normal in size without focal abnormality.

Adrenals/Urinary Tract: There are small cysts in bilateral kidneys
unchanged. There is no hydronephrosis bilaterally. The adrenal
glands are normal. The bladder is partially decompressed without
gross abnormality. 3-4 mm nonobstructing stones are identified in
the left kidney unchanged.

Stomach/Bowel: Stomach is within normal limits. The appendix is not
seen but no inflammation is noted around cecum. No evidence of bowel
wall thickening, distention, or inflammatory changes.

Vascular/Lymphatic: Aortic atherosclerosis. No enlarged abdominal or
pelvic lymph nodes.

Reproductive: Prostate is unremarkable.

Other: None.

Musculoskeletal: Focal sclerosis is identified in L3 unchanged
compared prior exam. Degenerative joint changes spine are noted.
IMPRESSION: 1. No acute abnormality identified in the abdomen and pelvis.
2. 3-4 mm nonobstructing stones in the left kidney unchanged.

Aortic Atherosclerosis ([GX]-[GX]).

## 2021-10-09 IMAGING — DX DG CHEST 1V PORT
1 series · 1 of 1 positions shown · non-contrast
Comparison: AP chest [DATE]

CLINICAL DATA: Pain.  Left upper quadrant pain.

EXAM:
PORTABLE CHEST 1 VIEW

[chest ap]
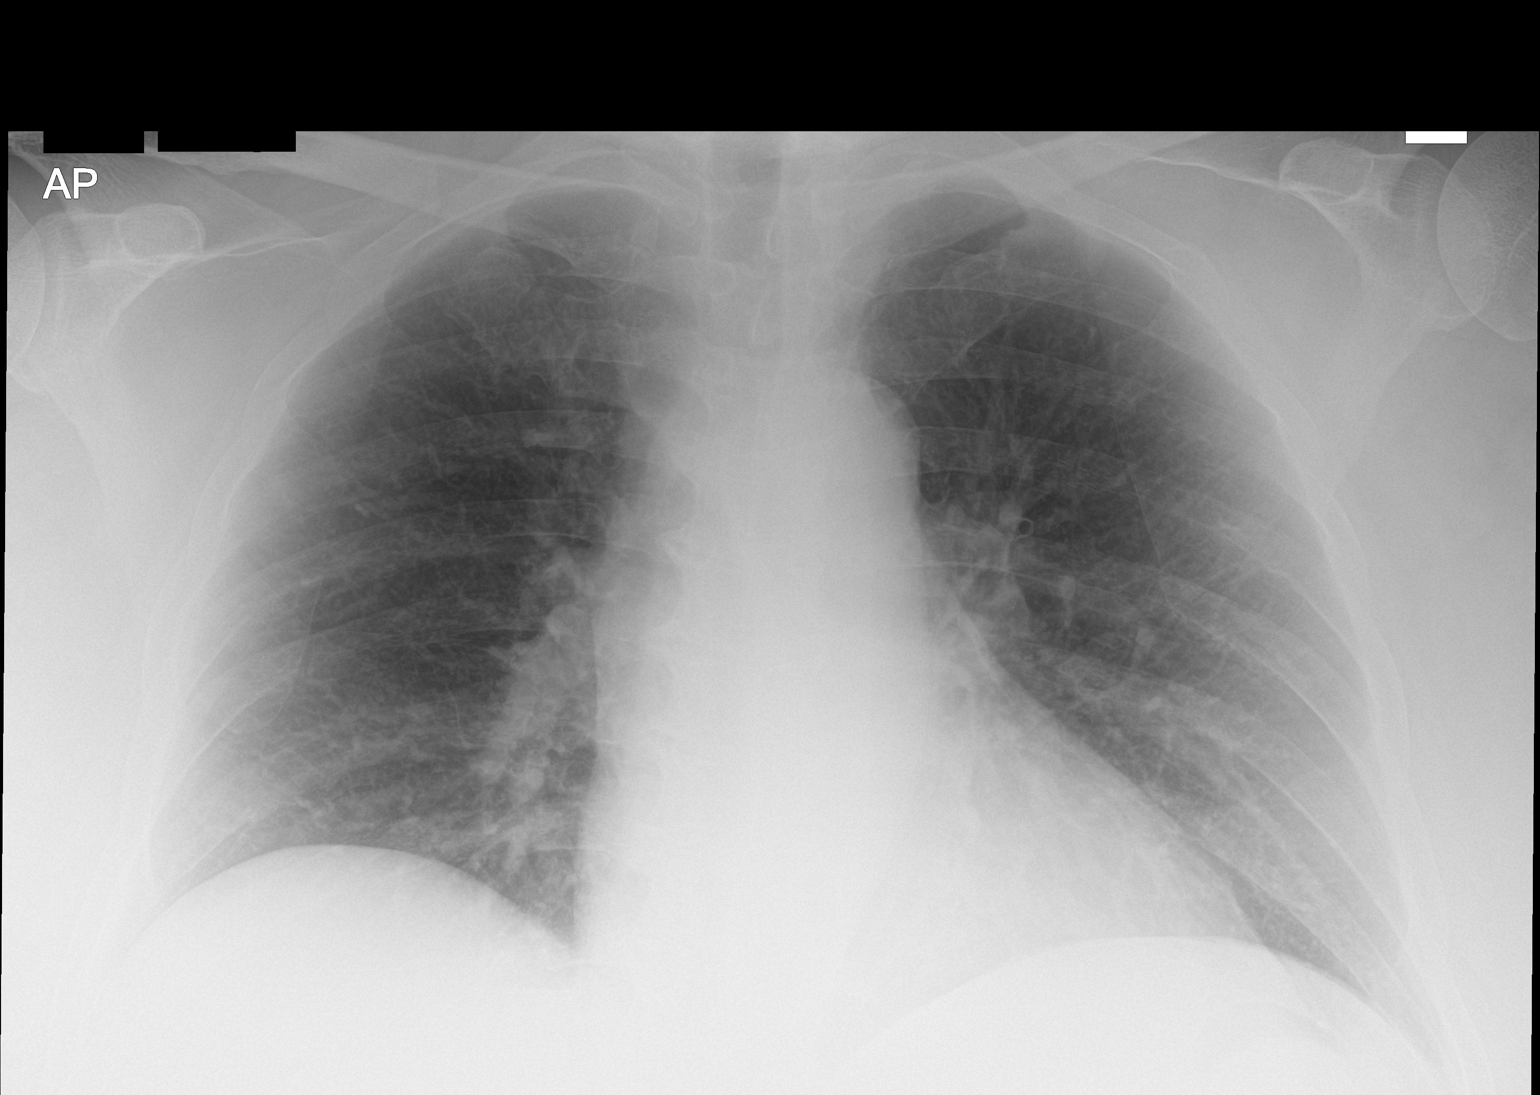

[1 of 1 positions shown; findings below may reference images not displayed]

FINDINGS: The heart size and mediastinal contours are within normal limits.
Both lungs are clear. The visualized skeletal structures are
unremarkable.
IMPRESSION: No active disease.

## 2021-10-09 MED ORDER — SUCRALFATE 1 G PO TABS: 1.0000 g | ORAL_TABLET | Freq: Three times a day (TID) | ORAL | 0 refills | Status: AC

## 2021-10-09 MED ORDER — DICYCLOMINE HCL 20 MG PO TABS: 20.0000 mg | ORAL_TABLET | Freq: Two times a day (BID) | ORAL | 0 refills | Status: DC

## 2021-10-09 MED ORDER — MORPHINE SULFATE (PF) 4 MG/ML IV SOLN
4.0000 mg | Freq: Once | INTRAVENOUS | Status: AC
Start: 2021-10-09 — End: 2021-10-09
  Administered 2021-10-09: 4 mg via INTRAVENOUS
  Filled 2021-10-09: qty 1

## 2021-10-09 MED ORDER — ALUM & MAG HYDROXIDE-SIMETH 200-200-20 MG/5ML PO SUSP
30.0000 mL | Freq: Once | ORAL | Status: AC
  Administered 2021-10-09: 30 mL via ORAL
  Filled 2021-10-09: qty 30

## 2021-10-09 MED ORDER — KETOROLAC TROMETHAMINE 15 MG/ML IJ SOLN
15.0000 mg | Freq: Once | INTRAMUSCULAR | Status: AC
Start: 2021-10-09 — End: 2021-10-09
  Administered 2021-10-09: 15 mg via INTRAVENOUS
  Filled 2021-10-09: qty 1

## 2021-10-09 MED ORDER — IOHEXOL 300 MG/ML  SOLN
100.0000 mL | Freq: Once | INTRAMUSCULAR | Status: AC | PRN
  Administered 2021-10-09: 100 mL via INTRAVENOUS

## 2021-10-09 MED ORDER — FAMOTIDINE IN NACL 20-0.9 MG/50ML-% IV SOLN
20.0000 mg | Freq: Once | INTRAVENOUS | Status: AC
Start: 2021-10-09 — End: 2021-10-09
  Administered 2021-10-09: 20 mg via INTRAVENOUS
  Filled 2021-10-09: qty 50

## 2021-10-09 MED ORDER — SODIUM CHLORIDE 0.9 % IV BOLUS
1000.0000 mL | Freq: Once | INTRAVENOUS | Status: AC
  Administered 2021-10-09: 1000 mL via INTRAVENOUS

## 2021-10-09 MED ORDER — ONDANSETRON HCL 4 MG/2ML IJ SOLN
4.0000 mg | Freq: Once | INTRAMUSCULAR | Status: AC
Start: 2021-10-09 — End: 2021-10-09
  Administered 2021-10-09: 4 mg via INTRAVENOUS
  Filled 2021-10-09: qty 2

## 2021-10-09 MED ORDER — LIDOCAINE VISCOUS HCL 2 % MT SOLN
15.0000 mL | Freq: Once | OROMUCOSAL | Status: AC
Start: 2021-10-09 — End: 2021-10-09
  Administered 2021-10-09: 15 mL via ORAL
  Filled 2021-10-09: qty 15

## 2021-10-09 MED ORDER — DICYCLOMINE HCL 10 MG PO CAPS
10.0000 mg | ORAL_CAPSULE | Freq: Once | ORAL | Status: AC
  Administered 2021-10-09: 10 mg via ORAL
  Filled 2021-10-09: qty 1

## 2021-10-09 NOTE — Discharge Instructions (Signed)
There are many causes of abdominal pain. Most pain is not serious and goes away, but some pain gets worse, changes, or will not go away. Please return to the emergency department or see your doctor right away if you (or your family member) experience any of the following:  ?1. Pain that gets worse or moves to just one spot.  ?2. Pain that gets worse if you cough or sneeze.  ?3. Pain with going over a bump in the road.  ?4. Pain that does not get better in 24 hours.  ?5. Inability to keep down liquids (vomiting)-especially if you are making less urine.  ?6. Fainting.  ?7. Blood in the vomit or stool.  ?8. High fever or shaking chills.  ?9. Swelling of the abdomen.  ?10. Any new or worsening problem.  ?  ?  ?Follow-up Instructions  ?See your primary care provider if not completely better in the next 2-3 days. Come to the ED if you are unable to see them in this time frame.  ?  ?Additional Instructions  ?No alcohol.  ?No caffeine, aspirin, or cigarettes. ?  ?Please return to the emergency department immediately for any new or concerning symptoms, or if you get worse. ?

## 2021-10-09 NOTE — ED Triage Notes (Signed)
To ER by EMS , left upper quadrant pain , burning sensations per pt , radiating to left thoracic area . Denies NVD. Type II diabetes .

## 2021-10-09 NOTE — ED Notes (Signed)
Pt voided yet forgot to obtain specimen. Reminded pt to inform staff when ready to void again .

## 2021-10-09 NOTE — ED Provider Notes (Signed)
[facility] EMERGENCY DEPARTMENT Provider Note   CSN: [id number] Arrival date & time: 10/09/21  0848     History  Chief Complaint  Patient presents with   Abdominal Pain    Field Tyler Brewer is a 61 y.o. male.  This is a 61 y.o. male  with significant medical history as below, including CAD, hypertension, hyperlipidemia, prior polysubstance abuse, OSA on CPAP who presents to the ED with complaint of left upper quadrant abdominal discomfort  Location: Left upper quadrant abdomen Duration: 3 weeks Onset: Gradual Timing: Intermittent Description: Sharp, stabbing, aching, burning Severity: Mild  Exacerbating/Alleviating Factors:  worse with palpation, torso twisting, leaning over Associated Symptoms: Mild chest discomfort Pertinent Negatives: No dyspnea, fevers, chills, nausea or vomiting.  No rashes, no numbness or tingling, no headaches, no changes to baseline ambulation.  No recent medication changes or diet changes.  No trauma.  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[place]) No date: Neuropathy No date: Obesity     Comment:  Max weight was 390 No date: OSA on CPAP No date: Panic attacks No date: Testosterone deficiency No date: Vertigo  Past Surgical History: No date: CARDIAC CATHETERIZATION 11/10/2020: IR FLUORO GUIDE CV LINE RIGHT 11/10/2020: IR US GUIDE VASC ACCESS RIGHT 03/31/2012: LEFT HEART CATHETERIZATION WITH CORONARY ANGIOGRAM; N/A      Comment:  Procedure: LEFT HEART CATHETERIZATION WITH CORONARY               ANGIOGRAM;  Surgeon: Leonie Man, MD;  Location: [facility]               CATH LAB;  Service: Cardiovascular;  Laterality: N/A;    The history is provided by the patient. No language interpreter was used.  Abdominal Pain Associated symptoms: chest pain   Associated symptoms: no chills, no cough, no fever, no hematuria, no nausea, no shortness of breath and no vomiting       Home Medications Prior to Admission medications   Medication Sig Start Date End Date Taking? Authorizing Provider  dicyclomine (BENTYL) 20 MG tablet Take 1 tablet (20 mg total) by mouth 2 (two) times daily for 7 days. 10/09/21 10/16/21 Yes Tyler Brewer A, DO  sucralfate (CARAFATE) 1 g tablet Take 1 tablet (1 g total) by mouth 4 (four) times daily -  with meals and at bedtime for 7 days. 10/09/21 10/16/21 Yes Jeanell Sparrow, DO  acetaminophen (TYLENOL) 500 MG tablet Take 500-1,000 mg by mouth every 6 (six) hours as needed for mild pain.    [provider]  AMBULATORY NON FORMULARY MEDICATION Medication Name: [facility] Wheelchair 07/31/21   Carollee Herter, Alferd Apa, DO  amLODipine (NORVASC) 10 MG tablet Take 1 tablet (10 mg total) by mouth daily. 08/24/21   [name], DO  aspirin EC 81 MG tablet Take 1 tablet (81 mg total) by mouth daily. Swallow whole. 09/29/21 09/29/22  Swayze, Ava, DO  calcium carbonate (TUMS - DOSED IN MG ELEMENTAL CALCIUM) 500 MG chewable  tablet Chew 1 tablet (200 mg of elemental calcium total) by mouth every 6 (six) hours as needed for indigestion or heartburn. 09/29/21   Swayze, Ava, DO  Cyanocobalamin (VITAMIN B-12 PO) Take 1 tablet by mouth daily.    [provider]  diclofenac Sodium (VOLTAREN) 1 % GEL Apply 4 g topically 4 (four) times daily as needed (joint pain).    [provider]  DULoxetine (CYMBALTA) 60 MG capsule Take 60 mg by mouth daily. 05/06/21   [provider]  furosemide  (LASIX) 20 MG tablet Take 1 tablet (20 mg total) by mouth 2 (two) times daily. 09/29/21   Swayze, Ava, DO  gabapentin (NEURONTIN) 300 MG capsule Take 2 capsules (600 mg total) by mouth 3 (three) times daily. 09/29/21   Swayze, Ava, DO  hydrALAZINE (APRESOLINE) 50 MG tablet TAKE 1 TABLET(50 MG) BY MOUTH EVERY 8 HOURS Patient taking differently: Take 50 mg by mouth every 8 (eight) hours. 08/01/21   [name], DO  hydrOXYzine (ATARAX) 25 MG tablet Take 1 tablet (25 mg total) by mouth every 6 (six) hours as needed for anxiety. 08/09/21   [name], DO  immune globulin, human, (GAMMAGARD S/D) 5 g injection Inject into the vein every 21 ( twenty-one) days. Patient not sure of dose    [provider]  insulin aspart (NOVOLOG FLEXPEN) 100 UNIT/ML FlexPen Max daily 80 units per scale Patient taking differently: Inject 0-80 Units into the skin See admin instructions. Max daily 80 units per scale 08/24/21   [name], Melanie Crazier, MD  insulin degludec (TRESIBA FLEXTOUCH) 100 UNIT/ML FlexTouch Pen Inject 30 Units into the skin daily. 08/24/21   [name], Melanie Crazier, MD  Insulin Pen Needle 31G X 5 MM MISC 1 Device by Does not apply route in the morning, at noon, in the evening, and at bedtime. 08/24/21   [name], Melanie Crazier, MD  lidocaine (LIDODERM) 5 % Place 1 patch onto the skin daily. Remove & Discard patch within 12 hours or as directed by MD 09/29/21   Swayze, Ava, DO  losartan (COZAAR) 100 MG tablet TAKE ONE TABLET BY MOUTH DAILY Patient taking differently: Take 100 mg by mouth daily. 04/18/21   [name] R, DO  MAGNESIUM PO Take 1 tablet by mouth daily.    [provider]  metFORMIN (GLUCOPHAGE-XR) 500 MG 24 hr tablet TAKE 2 TABLETS IN THE      MORNING AND AT BEDTIME Patient taking differently: Take 1,000 mg by mouth in the morning and at bedtime. 09/26/21   [name], Melanie Crazier, MD  methocarbamol (ROBAXIN) 500 MG tablet Take 500 mg by  mouth 4 (four) times daily as needed for muscle spasms.    [provider]  metoprolol tartrate (LOPRESSOR) 100 MG tablet Take 1 tablet (100 mg total) by mouth once for 1 dose. Take 1.5-2 hours before coronary CTA. 09/29/21 09/29/21  Sande Rives E, PA-C  Multiple Vitamin (MULTIVITAMIN) tablet Take 1 tablet by mouth daily.    [provider]  nitroGLYCERIN (NITROSTAT) 0.4 MG SL tablet Place 1 tablet (0.4 mg total) under the tongue every 5 (five) minutes as needed for chest pain. 09/29/21   Swayze, Ava, DO  NONFORMULARY OR COMPOUNDED ITEM Power wheelchair 06/15/21   Carollee Herter, Alferd Apa, DO  NONFORMULARY OR COMPOUNDED [employer] wheelchair 08/04/21   Carollee Herter, Alferd Apa, DO  omeprazole (PRILOSEC OTC) 20 MG tablet Take 1 tablet (20 mg total) by mouth daily. 09/29/21 09/29/22  Swayze, Ava, DO  polyethylene glycol (MIRALAX / GLYCOLAX) 17 g packet Take 17 g by mouth daily as needed for mild constipation. 07/21/21   Davonna Belling, MD  pravastatin (PRAVACHOL) 40 MG tablet TAKE 1 TABLET DAILY Patient taking differently: Take 40 mg by mouth daily. 09/25/21   [name] R, DO  tamsulosin (FLOMAX) 0.4 MG CAPS capsule TAKE ONE CAPSULE BY MOUTH ONE TIME DAILY Patient taking differently: Take 0.4 mg by mouth daily. 04/18/21   [name], DO  traMADol (ULTRAM) 50 MG tablet 2 po q6 h prn Patient taking differently: Take 100 mg by mouth every 6 (six) hours as needed for moderate pain. 09/08/21   [name], DO      Allergies    Patient has no known allergies.    Review of Systems   Review of Systems  Constitutional:  Negative for chills and fever.  HENT:  Negative for facial swelling and trouble swallowing.   Eyes:  Negative for photophobia and visual disturbance.  Respiratory:  Negative for cough and shortness of breath.   Cardiovascular:  Positive for chest pain. Negative for palpitations.  Gastrointestinal:  Positive for abdominal pain. Negative for  nausea and vomiting.  Endocrine: Negative for polydipsia and polyuria.  Genitourinary:  Negative for difficulty urinating and hematuria.  Musculoskeletal:  Negative for gait problem and joint swelling.  Skin:  Negative for pallor and rash.  Neurological:  Negative for syncope and headaches.  Psychiatric/Behavioral:  Negative for agitation and confusion.    Physical Exam Updated Vital Signs BP (!) 155/90    Pulse 76    Temp 98.1 F (36.7 C) (Oral)    Resp 19    SpO2 96%  Physical Exam Vitals and nursing note reviewed.  Constitutional:      General: He is not in acute distress.    Appearance: He is well-developed.  HENT:     Head: Normocephalic and atraumatic.     Right Ear: External ear normal.     Left Ear: External ear normal.     Mouth/Throat:     Mouth: Mucous membranes are moist.  Eyes:     General: No scleral icterus. Cardiovascular:     Rate and Rhythm: Normal rate and regular rhythm.     Pulses: Normal pulses.     Heart sounds: Normal heart sounds.  Pulmonary:     Effort: Pulmonary effort is normal. No respiratory distress.     Breath sounds: Normal breath sounds.  Abdominal:     General: Abdomen is flat.     Palpations: Abdomen is soft.     Tenderness: There is abdominal tenderness in the left upper quadrant.  Musculoskeletal:        General: Normal range of motion.     Cervical back: Normal range of motion.     Right lower leg: Edema (trace) present.     Left lower leg: Edema (trace) present.  Skin:    General: Skin is warm and dry.     Capillary Refill: Capillary refill takes less than 2 seconds.  Neurological:     Mental Status: He is alert and oriented to person, place, and time.  Psychiatric:        Mood and Affect: Mood normal.        Behavior: Behavior normal.    ED Results / Procedures / Treatments   Labs (all labs ordered are listed, but only abnormal results are displayed) Labs Reviewed  COMPREHENSIVE METABOLIC PANEL -  Abnormal; Notable for the  following components:      Result Value   Sodium 134 (*)    Glucose, Bld 122 (*)    All other components within normal limits  URINALYSIS, ROUTINE W REFLEX MICROSCOPIC - Abnormal; Notable for the following components:   Hgb urine dipstick SMALL (*)    All other components within normal limits  URINALYSIS, MICROSCOPIC (REFLEX) - Abnormal; Notable for the following components:   Bacteria, UA RARE (*)    All other components within normal limits  URINALYSIS, ROUTINE W REFLEX MICROSCOPIC - Abnormal; Notable for the following components:   Glucose, UA 100 (*)    All other components within normal limits  RESP PANEL BY RT-PCR (FLU A&B, COVID) ARPGX2  URINE CULTURE  CBC WITH DIFFERENTIAL/PLATELET  LIPASE, BLOOD  TROPONIN I (HIGH SENSITIVITY)  TROPONIN I (HIGH SENSITIVITY)    EKG EKG Interpretation  Date/Time:  Monday October 09 2021 09:50:02 EST Ventricular Rate:  82 PR Interval:    QRS Duration: 97 QT Interval:  366 QTC Calculation: 428 R Axis:   -7 Text Interpretation: Accelerated junctional rhythm Minimal ST depression, lateral leads Interpretation limited secondary to artifact Confirmed by Tyler Brewer ([id number]) on 10/09/2021 3:58:20 PM  Radiology CT ABDOMEN PELVIS W CONTRAST  Result Date: 10/09/2021 CLINICAL DATA:  Left upper quadrant abdomen pain for 3 days. EXAM: CT ABDOMEN AND PELVIS WITH CONTRAST TECHNIQUE: Multidetector CT imaging of the abdomen and pelvis was performed using the standard protocol following bolus administration of intravenous contrast. RADIATION DOSE REDUCTION: This exam was performed according to the departmental dose-optimization program which includes automated exposure control, adjustment of the mA and/or kV according to patient size and/or use of iterative reconstruction technique. CONTRAST:  155mL OMNIPAQUE IOHEXOL 300 MG/ML  SOLN COMPARISON:  August 30, 2021 FINDINGS: Lower chest: Mild atelectasis of posterior lung bases are identified. The heart size is  upper limits of normal. Hepatobiliary: Small calcified granuloma is identified in the right lobe liver. The liver is otherwise unremarkable. The gallbladder and biliary tree are normal. Pancreas: Unremarkable. No pancreatic ductal dilatation or surrounding inflammatory changes. Spleen: Normal in size without focal abnormality. Adrenals/Urinary Tract: There are small cysts in bilateral kidneys unchanged. There is no hydronephrosis bilaterally. The adrenal glands are normal. The bladder is partially decompressed without gross abnormality. 3-4 mm nonobstructing stones are identified in the left kidney unchanged. Stomach/Bowel: Stomach is within normal limits. The appendix is not seen but no inflammation is noted around cecum. No evidence of bowel wall thickening, distention, or inflammatory changes. Vascular/Lymphatic: Aortic atherosclerosis. No enlarged abdominal or pelvic lymph nodes. Reproductive: Prostate is unremarkable. Other: None. Musculoskeletal: Focal sclerosis is identified in L3 unchanged compared prior exam. Degenerative joint changes spine are noted. IMPRESSION: 1. No acute abnormality identified in the abdomen and pelvis. 2. 3-4 mm nonobstructing stones in the left kidney unchanged. Aortic Atherosclerosis (ICD10-I70.0). Electronically Signed   By: Tyler Brewer M.D.   On: 10/09/2021 11:27   DG Chest Port 1 View  Result Date: 10/09/2021 CLINICAL DATA:  Pain.  Left upper quadrant pain. EXAM: PORTABLE CHEST 1 VIEW COMPARISON:  AP chest 09/28/2021 FINDINGS: The heart size and mediastinal contours are within normal limits. Both lungs are clear. The visualized skeletal structures are unremarkable. IMPRESSION: No active disease. Electronically Signed   By: Tyler Brewer M.D.   On: 10/09/2021 09:48    Procedures Procedures    Medications Ordered in ED Medications  morphine (PF) 4 MG/ML injection 4 mg (4 mg Intravenous Given  10/09/21 1028)  ondansetron (ZOFRAN) injection 4 mg (4 mg Intravenous Given  10/09/21 1028)  sodium chloride 0.9 % bolus 1,000 mL (0 mLs Intravenous Stopped 10/09/21 1409)  famotidine (PEPCID) IVPB 20 mg premix (0 mg Intravenous Stopped 10/09/21 1104)  dicyclomine (BENTYL) capsule 10 mg (10 mg Oral Given 10/09/21 1030)  alum & mag hydroxide-simeth (MAALOX/MYLANTA) 200-200-20 MG/5ML suspension 30 mL (30 mLs Oral Given 10/09/21 1030)    And  lidocaine (XYLOCAINE) 2 % viscous mouth solution 15 mL (15 mLs Oral Given 10/09/21 1030)  iohexol (OMNIPAQUE) 300 MG/ML solution 100 mL (100 mLs Intravenous Contrast Given 10/09/21 1104)  ketorolac (TORADOL) 15 MG/ML injection 15 mg (15 mg Intravenous Given 10/09/21 1525)    ED Course/ Medical Decision Making/ A&P                           Medical Decision Making Amount and/or Complexity of Data Reviewed External Data Reviewed: labs, radiology, ECG and notes. Labs: ordered. Decision-making details documented in ED Course. Radiology: ordered. Decision-making details documented in ED Course. ECG/medicine tests: ordered and independent interpretation performed. Decision-making details documented in ED Course.  Risk OTC drugs. Prescription drug management.    CC: Abdominal pain  This patient presents to the Emergency Department for the above complaint. This involves an extensive number of treatment options and is a complaint that carries with it a high risk of complications and morbidity. Vital signs were reviewed. Serious etiologies considered.  Record review:  Previous records obtained and reviewed   Medical and surgical history as noted above.   Work up as above, notable for:   [employer] & imaging results that were available during my care of the patient were reviewed by me and considered in my medical decision making.   I ordered imaging studies which included CXR, CT A/p and I reviewed imaging which showed no acute process, he has small renal stones unchanged from prior, no obstruction.  no acute process  Cardiac monitoring  reviewed and interpreted personally which shows NSR  Social determinants of health include - N/a  Troponin negative x2, EKG without acute ischemia.  No chest pain.  Chest x-ray negative.  ACS unlikely.  Management: GI cocktail, analgesics  Reassessment:  Pain is resolved.  He is tolerant p.o. intake, feels he is back to his baseline.  Repeat abdominal exam is soft, nontender, nonperitoneal.  The patient's overall condition has improved, the patient presents with abdominal pain without signs of peritonitis, or other life-threatening serious etiology. The patient understands that at this time there is no evidence for a more malignant underlying process, but the patient also understands that early in the process of an illness, an emergency department workup can be falsely reassuring. Detailed discussions were had with the patient regarding current findings, and need for close f/u with PCP or on call doctor. The patient appears stable for discharge and has been instructed to return immediately if the symptoms worsen in any way for re-evaluation. Patient verbalized understanding and is in agreement with current care plan.  All questions answered prior to discharge.   This chart was dictated using voice recognition software.  Despite best efforts to proofread,  errors can occur which can change the documentation meaning.         Final Clinical Impression(s) / ED Diagnoses Final diagnoses:  Abdominal pain, unspecified abdominal location    Rx / DC Orders ED Discharge Orders  Ordered    dicyclomine (BENTYL) 20 MG tablet  2 times daily        10/09/21 1514    sucralfate (CARAFATE) 1 g tablet  3 times daily with meals & bedtime        10/09/21 [place], Naina Sleeper A, DO 10/09/21 1559

## 2021-10-09 NOTE — ED Notes (Signed)
I spoke with Pt. Explained our need for a urine sample still. Pt. Is aware, MD ok'd water to try to encourage urine production.

## 2021-10-09 NOTE — ED Triage Notes (Signed)
Arrived ems w c/o abd pain x 1 week  no n/v/d

## 2021-10-10 LAB — URINE CULTURE: Culture: NO GROWTH

## 2021-10-10 NOTE — Telephone Encounter (Signed)
Opened in error

## 2021-10-13 DIAGNOSIS — G4733 Obstructive sleep apnea (adult) (pediatric): Secondary | ICD-10-CM | POA: Diagnosis not present

## 2021-10-13 DIAGNOSIS — M1990 Unspecified osteoarthritis, unspecified site: Secondary | ICD-10-CM | POA: Diagnosis not present

## 2021-10-13 DIAGNOSIS — E1121 Type 2 diabetes mellitus with diabetic nephropathy: Secondary | ICD-10-CM | POA: Diagnosis not present

## 2021-10-13 DIAGNOSIS — Z794 Long term (current) use of insulin: Secondary | ICD-10-CM | POA: Diagnosis not present

## 2021-10-13 DIAGNOSIS — K589 Irritable bowel syndrome without diarrhea: Secondary | ICD-10-CM | POA: Diagnosis not present

## 2021-10-13 DIAGNOSIS — I10 Essential (primary) hypertension: Secondary | ICD-10-CM | POA: Diagnosis not present

## 2021-10-13 DIAGNOSIS — F32A Depression, unspecified: Secondary | ICD-10-CM | POA: Diagnosis not present

## 2021-10-13 DIAGNOSIS — J849 Interstitial pulmonary disease, unspecified: Secondary | ICD-10-CM | POA: Diagnosis not present

## 2021-10-13 DIAGNOSIS — E1142 Type 2 diabetes mellitus with diabetic polyneuropathy: Secondary | ICD-10-CM | POA: Diagnosis not present

## 2021-10-13 DIAGNOSIS — I2510 Atherosclerotic heart disease of native coronary artery without angina pectoris: Secondary | ICD-10-CM | POA: Diagnosis not present

## 2021-10-13 DIAGNOSIS — Z6841 Body Mass Index (BMI) 40.0 and over, adult: Secondary | ICD-10-CM | POA: Diagnosis not present

## 2021-10-13 DIAGNOSIS — E7800 Pure hypercholesterolemia, unspecified: Secondary | ICD-10-CM | POA: Diagnosis not present

## 2021-10-13 DIAGNOSIS — G6181 Chronic inflammatory demyelinating polyneuritis: Secondary | ICD-10-CM | POA: Diagnosis not present

## 2021-10-13 DIAGNOSIS — N401 Enlarged prostate with lower urinary tract symptoms: Secondary | ICD-10-CM | POA: Diagnosis not present

## 2021-10-13 DIAGNOSIS — N39498 Other specified urinary incontinence: Secondary | ICD-10-CM | POA: Diagnosis not present

## 2021-10-13 DIAGNOSIS — M5441 Lumbago with sciatica, right side: Secondary | ICD-10-CM | POA: Diagnosis not present

## 2021-10-13 DIAGNOSIS — Z7984 Long term (current) use of oral hypoglycemic drugs: Secondary | ICD-10-CM | POA: Diagnosis not present

## 2021-10-13 DIAGNOSIS — F988 Other specified behavioral and emotional disorders with onset usually occurring in childhood and adolescence: Secondary | ICD-10-CM | POA: Diagnosis not present

## 2021-10-13 DIAGNOSIS — E1136 Type 2 diabetes mellitus with diabetic cataract: Secondary | ICD-10-CM | POA: Diagnosis not present

## 2021-10-13 DIAGNOSIS — Z87891 Personal history of nicotine dependence: Secondary | ICD-10-CM | POA: Diagnosis not present

## 2021-10-13 DIAGNOSIS — F410 Panic disorder [episodic paroxysmal anxiety] without agoraphobia: Secondary | ICD-10-CM | POA: Diagnosis not present

## 2021-10-13 NOTE — Progress Notes (Unsigned)
Cardiology Office Note:    Date:  10/13/2021   ID:  Tyler Brewer, DOB 28-Feb-1961, MRN [id number]  PCP:  [name], DO  Cardiologist:  Kirk Ruths, MD  Electrophysiologist:  None   Referring MD: Carollee Herter, Alferd Apa, *   Chief Complaint: hospital follow-up for chest pain  History of Present Illness:    Tyler Brewer is a 61 y.o. male with a history of moderate luminal irregularities but no obstructive CAD noted on cardiac catheterization in 03/2012, hypertension, hyperlipidemia, type 2 diabetes on insulin, obstructive sleep apnea on CPAP, IBS, chronic inflammatory demyelinating polyneuropathy, chronic back pain, prior methamphetamine abuse, and anxiety who is followed by Dr. Stanford Breed and presents today for hospital follow-up of chest pain.   Remote cardiac catheterization in 2013 showed mild to moderate luminal irregularities but no significant disease. Last ischemic evaluation was a Myoview in 11/2015 was low risk with no evidence of ischemia. Patient was recently admitted from 09/28/2021 to 09/29/2021 for evaluation of chest pain and shortness of breath. Patient has a lot of chronic pain issues due to chronic back pain and CIDP. However, he presented with a different type of back pain that radiated to the front of his chest and was quite severe. Pain was worse when taking a deep breath and reproducible with palpation of back and chest. He also has chronic dyspnea with exertion and reported this was a little worse than usual. EKG showed no acute ischemic changes. High-sensitivity troponin was mildly elevated and flat peaking at 139 not consistent with ACS. BNP normal. CTA was negative for aortic dissection.  Echo showed LVEF of 55-60% with normal wall motion and borderline dilatation of the ascending aorta measuring 38 mm. Symptoms were felt to be very atypical. However, outpatient coronary CTA was ordered due to multiple CV risk factors.   He presented back to the ED on 10/09/2021  for further evaluation of left upper quadrant abdominal pain. Work-up including high-sensitivity troponin, lipase, urinalysis, abdominal/pelvic CT all unremarkable. He was treated with GI cocktail, Pepcid, Morphine, and Toradol and pain resolved. He was discharged and advised to follow-up with PCP.   Patient presents today for follow-up. ***  Chest Pain  Patient recently admitted with atypical chest pain and back pain. High-sensitivity troponin mildly elevated and flat peaking at 139 (not consistent with ACS). EKG showed no acute findings. Echo showed LVEF of 55-60% with normal wall motion. Outpatient coronary CTA was ordered and showed *** -Continue aspirin, beta-blocker, and statin.   Chronic Dyspnea  Patient has chronic dyspnea on exertion. Echo and coronary CTA as above. *** -Stable. Appears euvolemic on exam.  -Continue Lasix 20mg  twice daily. ***  Hypertension  BP well controlled.  -Continue current medications: Amlodipine 10mg  daily, Losartan 100mg  daily, and Hydralazine 50mg  three times daily.   Hyperlipidemia  LDL 63 in 04/2021.  -Continue Pravastatin 40mg  daily.  -Labs followed by PCP.   Type 2 Diabetes Mellitus  Hemoglobin 8.5 during recent admission.  -Management per primary team.   Past Medical History:  Diagnosis Date   Arthritis    CAD (coronary artery disease)  cardiac cath with moderate disease in a septal branch of the ramus intermedius 04/01/2012   CIDP (chronic inflammatory demyelinating polyneuropathy) (HCC)    Depression    Diabetes mellitus    poorly controlled by his report   History of narcotic addiction ([place])    past history of back pain   Hypercholesteremia    Hypertension    IBS (irritable bowel  syndrome)    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[place])    Neuropathy    Obesity    Max weight was 390   OSA on CPAP    Panic attacks    Testosterone deficiency    Vertigo     Past Surgical History:  Procedure Laterality Date   CARDIAC CATHETERIZATION     IR  FLUORO GUIDE CV LINE RIGHT  11/10/2020   IR US GUIDE VASC ACCESS RIGHT  11/10/2020   LEFT HEART CATHETERIZATION WITH CORONARY ANGIOGRAM N/A 03/31/2012   Procedure: LEFT HEART CATHETERIZATION WITH CORONARY ANGIOGRAM;  Surgeon: Leonie Man, MD;  Location[facility] CATH LAB;  Service: Cardiovascular;  Laterality: N/A;    Current Medications: No outpatient medications have been marked as taking for the 10/27/21 encounter (Appointment) with Tyler Mclean, PA-C.     Allergies:   Patient has no known allergies.   Social History   Socioeconomic History   Marital status: Single    Spouse name: Not on file   Number of children: 2   Years of education: Not on file   Highest education level: Not on file  Occupational History   Not on file  Tobacco Use   Smoking status: Former    Packs/day: 0.50    Types: Cigarettes    Quit date: 04/27/2021    Years since quitting: 0.4   Smokeless tobacco: Never  Vaping Use   Vaping Use: Never used  Substance and Sexual Activity   Alcohol use: No   Drug use: Not Currently    Types: Methamphetamines   Sexual activity: Yes  Other Topics Concern   Not on file  Social History Narrative     Has 2 grown children.     Works as a Barrister's clerk.  Education: Oceanographer.   Right Handed   Drinks Caffeine    Mother recently moved and sold her house-- pt is living with a friend in a trailer    Social Determinants of Radio broadcast assistant Strain: Low Risk    Difficulty of Paying Living Expenses: Not very hard  Food Insecurity: No Food Insecurity   Worried About Charity fundraiser in the Last Year: Never true   Arboriculturist in the Last Year: Never true  Transportation Needs: Public librarian (Medical): Yes   Lack of Transportation (Non-Medical): Yes  Physical Activity: Insufficiently Active   Days of Exercise per Week: 2 days   Minutes of Exercise per Session: 30 min  Stress: Not on file  Social Connections: Not on  file     Family History: The patient's ***family history includes Diabetes type II in his father; Healthy in his daughter, mother, sister, and son; Hypertension in his father; Pancreatic disease (age of onset: 42) in his father; Parkinson's disease in his maternal grandmother.  ROS:   Please see the history of present illness.     EKGs/Labs/Other Studies Reviewed:    The following studies were reviewed today:  Myoview 05/01/2016: The left ventricular ejection fraction is mildly decreased (45-54%). Nuclear stress EF: 53%. No wall motion abnormalities. There was no ST segment deviation noted during stress. This is a low risk study. No ischemia identified. _______________  Echocardiogram 09/29/2021: Impressions:  1. Left ventricular ejection fraction, by estimation, is 55 to 60%. The  left ventricle has normal function. The left ventricle has no regional  wall motion abnormalities. Left ventricular diastolic parameters are  indeterminate.   2. Right ventricular systolic  function is normal. The right ventricular  size is normal. Tricuspid regurgitation signal is inadequate for assessing  PA pressure.   3. Left atrial size was mildly dilated.   4. The mitral valve is normal in structure. No evidence of mitral valve  regurgitation. No evidence of mitral stenosis.   5. The aortic valve is tricuspid. Aortic valve regurgitation is not  visualized. No aortic stenosis is present.   6. There is borderline dilatation of the ascending aorta, measuring 38  mm.   7. The inferior vena cava is normal in size with greater than 50%  respiratory variability, suggesting right atrial pressure of 3 mmHg.   EKG:  EKG not ordered today.   Recent Labs: 09/08/2021: TSH 2.84 09/28/2021: B Natriuretic Peptide 18.6 09/29/2021: Magnesium 1.7 10/09/2021: ALT 24; BUN 17; Creatinine, Ser 0.82; Hemoglobin 13.7; Platelets 276; Potassium 3.9; Sodium 134  Recent Lipid Panel    Component Value Date/Time   CHOL 140  05/18/2021 1612   TRIG 104.0 05/18/2021 1612   HDL 56.60 05/18/2021 1612   CHOLHDL 2 05/18/2021 1612   VLDL 20.8 05/18/2021 1612   LDLCALC 63 05/18/2021 1612   LDLDIRECT 124.0 05/02/2017 1507    Physical Exam:    Vital Signs: There were no vitals taken for this visit.    Wt Readings from Last 3 Encounters:  09/28/21 (!) 310 lb 13.6 oz (141 kg)  08/30/21 300 lb (136.1 kg)  07/21/21 300 lb (136.1 kg)     General: 61 y.o. male in no acute distress. HEENT: Normocephalic and atraumatic. Sclera clear. EOMs intact. Neck: Supple. No carotid bruits. No JVD. Heart: *** RRR. Distinct S1 and S2. No murmurs, gallops, or rubs. Radial and distal pedal pulses 2+ and equal bilaterally. Lungs: No increased work of breathing. Clear to ausculation bilaterally. No wheezes, rhonchi, or rales.  Abdomen: Soft, non-distended, and non-tender to palpation. Bowel sounds present in all 4 quadrants.  MSK: Normal strength and tone for age. *** Extremities: No lower extremity edema.    Skin: Warm and dry. Neuro: Alert and oriented x3. No focal deficits. Psych: Normal affect. Responds appropriately.   Assessment:    No diagnosis found.  Plan:     Disposition: Follow up in ***   Medication Adjustments/Labs and Tests Ordered: Current medicines are reviewed at length with the patient today.  Concerns regarding medicines are outlined above.  No orders of the defined types were placed in this encounter.  No orders of the defined types were placed in this encounter.   There are no Patient Instructions on file for this visit.   Signed, Tyler Mclean, PA-C  10/13/2021 8:55 AM    [facility]

## 2021-10-16 DIAGNOSIS — I10 Essential (primary) hypertension: Secondary | ICD-10-CM | POA: Diagnosis not present

## 2021-10-16 DIAGNOSIS — F410 Panic disorder [episodic paroxysmal anxiety] without agoraphobia: Secondary | ICD-10-CM | POA: Diagnosis not present

## 2021-10-16 DIAGNOSIS — N39498 Other specified urinary incontinence: Secondary | ICD-10-CM | POA: Diagnosis not present

## 2021-10-16 DIAGNOSIS — Z794 Long term (current) use of insulin: Secondary | ICD-10-CM | POA: Diagnosis not present

## 2021-10-16 DIAGNOSIS — M1990 Unspecified osteoarthritis, unspecified site: Secondary | ICD-10-CM | POA: Diagnosis not present

## 2021-10-16 DIAGNOSIS — Z6841 Body Mass Index (BMI) 40.0 and over, adult: Secondary | ICD-10-CM | POA: Diagnosis not present

## 2021-10-16 DIAGNOSIS — N401 Enlarged prostate with lower urinary tract symptoms: Secondary | ICD-10-CM | POA: Diagnosis not present

## 2021-10-16 DIAGNOSIS — Z87891 Personal history of nicotine dependence: Secondary | ICD-10-CM | POA: Diagnosis not present

## 2021-10-16 DIAGNOSIS — E7800 Pure hypercholesterolemia, unspecified: Secondary | ICD-10-CM | POA: Diagnosis not present

## 2021-10-16 DIAGNOSIS — I2510 Atherosclerotic heart disease of native coronary artery without angina pectoris: Secondary | ICD-10-CM | POA: Diagnosis not present

## 2021-10-16 DIAGNOSIS — K589 Irritable bowel syndrome without diarrhea: Secondary | ICD-10-CM | POA: Diagnosis not present

## 2021-10-16 DIAGNOSIS — F988 Other specified behavioral and emotional disorders with onset usually occurring in childhood and adolescence: Secondary | ICD-10-CM | POA: Diagnosis not present

## 2021-10-16 DIAGNOSIS — E1136 Type 2 diabetes mellitus with diabetic cataract: Secondary | ICD-10-CM | POA: Diagnosis not present

## 2021-10-16 DIAGNOSIS — J849 Interstitial pulmonary disease, unspecified: Secondary | ICD-10-CM | POA: Diagnosis not present

## 2021-10-16 DIAGNOSIS — M5441 Lumbago with sciatica, right side: Secondary | ICD-10-CM | POA: Diagnosis not present

## 2021-10-16 DIAGNOSIS — Z7984 Long term (current) use of oral hypoglycemic drugs: Secondary | ICD-10-CM | POA: Diagnosis not present

## 2021-10-16 DIAGNOSIS — E1121 Type 2 diabetes mellitus with diabetic nephropathy: Secondary | ICD-10-CM | POA: Diagnosis not present

## 2021-10-16 DIAGNOSIS — G6181 Chronic inflammatory demyelinating polyneuritis: Secondary | ICD-10-CM | POA: Diagnosis not present

## 2021-10-16 DIAGNOSIS — E1142 Type 2 diabetes mellitus with diabetic polyneuropathy: Secondary | ICD-10-CM | POA: Diagnosis not present

## 2021-10-16 DIAGNOSIS — F32A Depression, unspecified: Secondary | ICD-10-CM | POA: Diagnosis not present

## 2021-10-16 DIAGNOSIS — G4733 Obstructive sleep apnea (adult) (pediatric): Secondary | ICD-10-CM | POA: Diagnosis not present

## 2021-10-17 DIAGNOSIS — Z794 Long term (current) use of insulin: Secondary | ICD-10-CM | POA: Diagnosis not present

## 2021-10-17 DIAGNOSIS — I10 Essential (primary) hypertension: Secondary | ICD-10-CM

## 2021-10-17 DIAGNOSIS — E785 Hyperlipidemia, unspecified: Secondary | ICD-10-CM | POA: Diagnosis not present

## 2021-10-17 DIAGNOSIS — E119 Type 2 diabetes mellitus without complications: Secondary | ICD-10-CM

## 2021-10-17 DIAGNOSIS — G6181 Chronic inflammatory demyelinating polyneuritis: Secondary | ICD-10-CM | POA: Diagnosis not present

## 2021-10-18 ENCOUNTER — Telehealth (HOSPITAL_COMMUNITY): Payer: Self-pay | Admitting: Emergency Medicine

## 2021-10-18 DIAGNOSIS — R109 Unspecified abdominal pain: Secondary | ICD-10-CM | POA: Diagnosis not present

## 2021-10-18 DIAGNOSIS — G6181 Chronic inflammatory demyelinating polyneuritis: Secondary | ICD-10-CM | POA: Diagnosis not present

## 2021-10-18 NOTE — Telephone Encounter (Signed)
Number on file not in service. ?Marchia Bond RN Navigator Cardiac Imaging ?[facility] ?[phone] Office  ?[phone] Cell ?  ?

## 2021-10-19 ENCOUNTER — Ambulatory Visit (HOSPITAL_COMMUNITY): Admission: RE | Admit: 2021-10-19 | Payer: Medicare HMO | Source: Ambulatory Visit

## 2021-10-19 DIAGNOSIS — E7800 Pure hypercholesterolemia, unspecified: Secondary | ICD-10-CM | POA: Diagnosis not present

## 2021-10-19 DIAGNOSIS — J849 Interstitial pulmonary disease, unspecified: Secondary | ICD-10-CM | POA: Diagnosis not present

## 2021-10-19 DIAGNOSIS — G6181 Chronic inflammatory demyelinating polyneuritis: Secondary | ICD-10-CM | POA: Diagnosis not present

## 2021-10-19 DIAGNOSIS — I10 Essential (primary) hypertension: Secondary | ICD-10-CM | POA: Diagnosis not present

## 2021-10-19 DIAGNOSIS — M5441 Lumbago with sciatica, right side: Secondary | ICD-10-CM | POA: Diagnosis not present

## 2021-10-19 DIAGNOSIS — Z87891 Personal history of nicotine dependence: Secondary | ICD-10-CM | POA: Diagnosis not present

## 2021-10-19 DIAGNOSIS — Z794 Long term (current) use of insulin: Secondary | ICD-10-CM | POA: Diagnosis not present

## 2021-10-19 DIAGNOSIS — E1121 Type 2 diabetes mellitus with diabetic nephropathy: Secondary | ICD-10-CM | POA: Diagnosis not present

## 2021-10-19 DIAGNOSIS — N401 Enlarged prostate with lower urinary tract symptoms: Secondary | ICD-10-CM | POA: Diagnosis not present

## 2021-10-19 DIAGNOSIS — Z6841 Body Mass Index (BMI) 40.0 and over, adult: Secondary | ICD-10-CM | POA: Diagnosis not present

## 2021-10-19 DIAGNOSIS — E1142 Type 2 diabetes mellitus with diabetic polyneuropathy: Secondary | ICD-10-CM | POA: Diagnosis not present

## 2021-10-19 DIAGNOSIS — G4733 Obstructive sleep apnea (adult) (pediatric): Secondary | ICD-10-CM | POA: Diagnosis not present

## 2021-10-19 DIAGNOSIS — K589 Irritable bowel syndrome without diarrhea: Secondary | ICD-10-CM | POA: Diagnosis not present

## 2021-10-19 DIAGNOSIS — Z7984 Long term (current) use of oral hypoglycemic drugs: Secondary | ICD-10-CM | POA: Diagnosis not present

## 2021-10-19 DIAGNOSIS — N39498 Other specified urinary incontinence: Secondary | ICD-10-CM | POA: Diagnosis not present

## 2021-10-19 DIAGNOSIS — I2510 Atherosclerotic heart disease of native coronary artery without angina pectoris: Secondary | ICD-10-CM | POA: Diagnosis not present

## 2021-10-19 DIAGNOSIS — F410 Panic disorder [episodic paroxysmal anxiety] without agoraphobia: Secondary | ICD-10-CM | POA: Diagnosis not present

## 2021-10-19 DIAGNOSIS — M1990 Unspecified osteoarthritis, unspecified site: Secondary | ICD-10-CM | POA: Diagnosis not present

## 2021-10-19 DIAGNOSIS — E1136 Type 2 diabetes mellitus with diabetic cataract: Secondary | ICD-10-CM | POA: Diagnosis not present

## 2021-10-19 DIAGNOSIS — F32A Depression, unspecified: Secondary | ICD-10-CM | POA: Diagnosis not present

## 2021-10-19 DIAGNOSIS — F988 Other specified behavioral and emotional disorders with onset usually occurring in childhood and adolescence: Secondary | ICD-10-CM | POA: Diagnosis not present

## 2021-10-20 ENCOUNTER — Ambulatory Visit (INDEPENDENT_AMBULATORY_CARE_PROVIDER_SITE_OTHER): Payer: Medicare HMO | Admitting: Pharmacist

## 2021-10-20 DIAGNOSIS — Z79899 Other long term (current) drug therapy: Secondary | ICD-10-CM

## 2021-10-20 DIAGNOSIS — E1159 Type 2 diabetes mellitus with other circulatory complications: Secondary | ICD-10-CM

## 2021-10-20 DIAGNOSIS — E785 Hyperlipidemia, unspecified: Secondary | ICD-10-CM

## 2021-10-20 DIAGNOSIS — Z794 Long term (current) use of insulin: Secondary | ICD-10-CM

## 2021-10-20 DIAGNOSIS — I10 Essential (primary) hypertension: Secondary | ICD-10-CM

## 2021-10-20 NOTE — Chronic Care Management (AMB) (Signed)
Chronic Care Management Pharmacy Note  10/20/2021 Name:  Tyler Brewer MRN:  [id number] DOB:  September 12, 1960  Summary:  Has not been able to get sucralfate filled. Called [facility] to find out as since [place] shows it should be $5 for 30 days. Pharmacist initially ran thru and cost was $9.61 but that was with GoodRx card. Asked her to run thru with [employer] and cost was $1.63. Patient will pick up later today.  Reviewed adherence - patient has filled medications on time so far in 2023 and endorses that he is taking medications as prescribed.  Reminded him to check blood pressure 2 to 3 times per week. And continue to chech blood glucose with Continuous Glucose Monitor.   Subjective: Tyler Brewer is an 61 y.o. year old male who is a primary patient of [name], DO.  The CCM team was consulted for assistance with disease management and care coordination needs.    Engaged with patient by telephone for  follow up   in response to provider referral for pharmacy case management and/or care coordination services.   Consent to Services:  The patient was given information about Chronic Care Management services, agreed to services, and gave verbal consent prior to initiation of services.  Please see initial visit note for detailed documentation.   Patient Care Team: Carollee Herter, Alferd Apa, DO as PCP - General (Family Medicine) Stanford Breed Denice Bors, MD as PCP - Cardiology (Cardiology) Alda Berthold, DO as Consulting Physician (Neurology) Lajuana Matte, MD as Consulting Physician (Cardiothoracic Surgery) Marlaine Hind, MD as Consulting Physician (Physical Medicine and Rehabilitation) Luretha Rued, RN as Case Manager Cherre Robins, RPH-CPP (Pharmacist) [name], Melanie Crazier, MD as Consulting Physician (Endocrinology) Haddix, Thomasene Lot, MD as Consulting Physician (Orthopedic Surgery)  Recent office visits: 09/08/2021 - PCP (Dr Carollee Herter) Seen for follow up and  lower extremity edema. Changed furosemide to torsemide 66m daily  06/30/2021 - Fam Med (Dr LEtter Sjogren-Cheri Rous Seen for edema / pain / anxiety. Furosemide 464mfor a few days then back to 2061maily; Refilled hydroxyzine 7m50m to every 6 hours as needed. Refilled tramadol. Has appt to see pain management soon 05/18/2021 - PCP ([facility]. Noted to have increased anxiety, panic attackd and insomnia. Referred to pulmonology for OSA. Referral to urology for testicle pain. Recommended f/u with psychiatry   Recent consult visits: 10/04/2021 - Neurology (Dr McGhChristianne Dolin worsening lower extremity weakness. 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 Recommended continue IVIG every 3 weeks given over 2 days 09/14/2021 - Endo (Dr ShamKelton Pillarone call - uncontrolled BG. Please increase your NovoLog to 20 units with breakfast, 24 units with lunch and 24 units with supper. Also recommended use Novolog correctional insulin: ADD extra units on insulin to your meal-time novolog dose if your blood sugars are higher than 150. (See notes for sliding / correctional scale recommended)  07/27/2021 - IVIG infusion at [facility] chronic inflammatory demyelinating polyneuropathy 07/25/2021 - IVIG infusion at [facility] CIDP.  07/11/2021 - Endo (Dr ShamKelton PillarU diabetes, Decreased NPH does to 30 units daily. Increased Reg insuiln to 14 units wiht breakfast; 16 units with lunch and evening meals. Advised to stop nighttime snacking 06/27/2021 - Neurology (Dr McGhMikle Bosworthuropathy and worsening lower extremity weakness. continue IVIG 1 g/kg q 3 weeks, given over 2 days [facility], recommend strict glycemic control as hyperglycemia can worsen underlying neuropathy. CMP reviewed, Cr WNL,  glucose elevated, Na slightly low in the setting of hyperglycemia. CBC unremarkable. Continue Gabapentin and Duloxetine for neuropathic pain 07/04/2021 and 11/16/20222 - CIPD  infusion of IVIG 06/13/2021 and 06/14/2021- CIPD infusion of IVIG 05/23/2021 - [place] - received IVIG infusion for CIPD.      Hospital visits: 10/20/2021 - ED Visit at [facility]. Seen for abdominal pain. imaging studies which included CXR, CT A/p and I reviewed imaging which showed no acute process. GIven GI cocktain in ED and anagesics. Prescribed dicycloina and sucralfate at discharge.  10/02/2021 - ED Visit at [place] presented with SOB for 7 to 10 days and chest pain 07/21/2021 - ED Visit at [facility] for flank pain.  Prescribed Miralax 17 grams daily as needed. Ultrasound negative for hydronephrosis.  06/24/2021 - ED - [facility] - for edema per EMS report. Unable to see notes from ED visit.  05/14/2021 - ED Visit - Seen for constipation. Recommended Miralax as needed.   05/12/2021 ED Visit - Seen for lumbar strain and contusion of hip and thigh.  05/11/2021 - ED Visit - fall with fracture of medial malleolus of left tibia and of proximal phalanx of left great toe.  04/26/2021 to 05/05/2021 hospital admission at [place] for worsening weakness and falls. Received 5 days of IVIG treatment with gradual improvement in lower extremity weakness.  Medications started at discharge:  IVIG every 2 weeks, duloxetine 53m daily Medications changed at discharge:  gabapentin 3033m- take 4 caps = 120052m times a day; Novolin N - 20 units daily at bedtime; Novolin R 7 units 3 times a day before meals. Losartan 100m56mily. Tramadol 50g every 6 hours as needed for pain for up to 14 days.  Medications stopped at discharge:  ciprofloxacin, hydralazine, hydrocodone/APAP, Novolog Mix 70/30; nicotine 21mg28mches; nystatin powder, pregabalin 75mg,32mtra DS; Tresiba    Objective:  Lab Results  Component Value Date   CREATININE 0.82 10/09/2021   CREATININE 1.04 09/29/2021   CREATININE 0.79 09/28/2021    Lab Results   Component Value Date   HGBA1C 8.5 (H) 09/29/2021   Last diabetic Eye exam: No results found for: HMDIABEYEEXA  Last diabetic Foot exam: No results found for: HMDIABFOOTEX      Component Value Date/Time   CHOL 140 05/18/2021 1612   TRIG 104.0 05/18/2021 1612   HDL 56.60 05/18/2021 1612   CHOLHDL 2 05/18/2021 1612   VLDL 20.8 05/18/2021 1612   LDLCALC 63 05/18/2021 1612   LDLDIRECT 124.0 05/02/2017 1507    Hepatic Function Latest Ref Rng & Units 10/09/2021 09/29/2021 09/08/2021  Total Protein 6.5 - 8.1 g/dL 6.9 7.5 7.6  Albumin 3.5 - 5.0 g/dL 3.6 3.2(L) -  AST 15 - 41 U/L '19 22 17  ' ALT 0 - 44 U/L '24 25 21  ' Alk Phosphatase 38 - 126 U/L 69 77 -  Total Bilirubin 0.3 - 1.2 mg/dL 0.3 0.4 0.2    Lab Results  Component Value Date/Time   TSH 2.84 09/08/2021 02:17 PM   TSH 2.57 02/14/2021 02:19 PM    CBC Latest Ref Rng & Units 10/09/2021 09/29/2021 09/28/2021  WBC 4.0 - 10.5 K/uL 5.6 4.7 4.4  Hemoglobin 13.0 - 17.0 g/dL 13.7 13.6 13.8  Hematocrit 39.0 - 52.0 % 40.4 40.9 40.6  Platelets 150 - 400 K/uL 276 231 269    No results found for: VD25OH  Clinical ASCVD: Yes  The 10-year ASCVD risk score (  Arnett DK, et al., 2019) is: 10.9%   Values used to calculate the score:     Age: 76 years     Sex: Male     Is Non-Hispanic African American: No     Diabetic: Yes     Tobacco smoker: No     Systolic Blood Pressure: 680 mmHg     Is BP treated: Yes     HDL Cholesterol: 56.6 mg/dL     Total Cholesterol: 140 mg/dL     Social History   Tobacco Use  Smoking Status Former   Packs/day: 0.50   Types: Cigarettes   Quit date: 04/27/2021   Years since quitting: 0.4  Smokeless Tobacco Never   BP Readings from Last 3 Encounters:  10/09/21 (!) 155/90  09/29/21 130/60  09/08/21 132/82   Pulse Readings from Last 3 Encounters:  10/09/21 76  09/29/21 90  09/08/21 90   Wt Readings from Last 3 Encounters:  09/28/21 (!) 310 lb 13.6 oz (141 kg)  08/30/21 300 lb (136.1 kg)  07/21/21 300 lb  (136.1 kg)    Assessment: Review of patient past medical history, allergies, medications, health status, including review of consultants reports, laboratory and other test data, was performed as part of comprehensive evaluation and provision of chronic care management services.   SDOH:  (Social Determinants of Health) assessments and interventions performed:      CCM Care Plan  No Known Allergies  Medications Reviewed Today     Reviewed by Cherre Robins, RPH-CPP (Pharmacist) on 10/20/21 at 1531  Med List Status: <None>   Medication Order Taking? Sig Documenting Provider Last Dose Status Informant  acetaminophen (TYLENOL) 500 MG tablet [id number] Yes Take 500-1,000 mg by mouth every 6 (six) hours as needed for mild pain. [provider] Taking Active Self  Camillo Flaming MEDICATION [id number] Yes Medication Name: [facility] Wheelchair [place], [place], [place] Taking Active Self  amLODipine (NORVASC) 10 MG tablet [id number] Yes Take 1 tablet (10 mg total) by mouth daily. [name], DO Taking Active Self  aspirin EC 81 MG tablet [id number] Yes Take 1 tablet (81 mg total) by mouth daily. Swallow whole. Swayze, Ava, DO Taking Active   calcium carbonate (TUMS - DOSED IN MG ELEMENTAL CALCIUM) 500 MG chewable tablet [id number] Yes Chew 1 tablet (200 mg of elemental calcium total) by mouth every 6 (six) hours as needed for indigestion or heartburn. Swayze, Ava, DO Taking Active   Cyanocobalamin (VITAMIN B-12 PO) [id number] Yes Take 1 tablet by mouth daily. [provider] Taking Active Self  diclofenac Sodium (VOLTAREN) 1 % GEL [id number] Yes Apply 4 g topically 4 (four) times daily as needed (joint pain). [provider] Taking Active Self  DULoxetine (CYMBALTA) 60 MG capsule [id number] Yes Take 60 mg by mouth daily. [provider] Taking Active Self  furosemide (LASIX) 20 MG tablet [id number] Yes Take 1 tablet (20 mg total) by mouth 2 (two) times  daily. Swayze, Ava, DO Taking Active   gabapentin (NEURONTIN) 300 MG capsule [id number] Yes Take 2 capsules (600 mg total) by mouth 3 (three) times daily. Swayze, Ava, DO Taking Active   hydrALAZINE (APRESOLINE) 50 MG tablet [id number] Yes TAKE 1 TABLET(50 MG) BY MOUTH EVERY 8 HOURS Lowne Chase, Alferd Apa, DO Taking Active Self  hydrOXYzine (ATARAX) 25 MG tablet [id number] Yes Take 1 tablet (25 mg total) by mouth every 6 (six) hours as needed for anxiety. [name], DO Taking Active Self  immune  globulin, human, (GAMMAGARD S/D) 5 g injection [id number] Yes Inject into the vein every 21 ( twenty-one) days. Patient not sure of dose [provider] Taking Active Self           Med Note ([place], Armstead Peaks   Fri Sep 29, 2021  3:56 PM) Patient not sure of dose   insulin aspart (NOVOLOG FLEXPEN) 100 UNIT/ML FlexPen [id number] Yes Max daily 80 units per scale  Patient taking differently: Inject 0-80 Units into the skin See admin instructions. Max daily 80 units per scale   [name], Melanie Crazier, MD Taking Active Self  insulin degludec (TRESIBA FLEXTOUCH) 100 UNIT/ML FlexTouch Pen [id number] Yes Inject 30 Units into the skin daily. [name], Melanie Crazier, MD Taking Active Self  Insulin Pen Needle 31G X 5 MM MISC [id number] Yes 1 Device by Does not apply route in the morning, at noon, in the evening, and at bedtime. [name], Melanie Crazier, MD Taking Active Self  lidocaine (LIDODERM) 5 % [id number] Yes Place 1 patch onto the skin daily. Remove & Discard patch within 12 hours or as directed by MD Swayze, Ava, DO Taking Active   losartan (COZAAR) 100 MG tablet [id number] Yes TAKE ONE TABLET BY MOUTH DAILY [name], DO Taking Active Self  MAGNESIUM PO [id number] Yes Take 1 tablet by mouth daily. [provider] Taking Active Self  metFORMIN (GLUCOPHAGE-XR) 500 MG 24 hr tablet [id number] Yes TAKE 2 TABLETS IN THE      MORNING AND AT BEDTIME [name], Melanie Crazier, MD Taking Active Self  methocarbamol (ROBAXIN) 500 MG tablet [id number] Yes Take 500 mg by mouth 4 (four) times daily as needed for muscle spasms. [provider] Taking Active Self  metoprolol tartrate (LOPRESSOR) 100 MG tablet [id number]  Take 1 tablet (100 mg total) by mouth once for 1 dose. Take 1.5-2 hours before coronary CTA. Darreld Mclean, PA-C  Expired 09/29/21 2359   Multiple Vitamin (MULTIVITAMIN) tablet [id number] Yes Take 1 tablet by mouth daily. [provider] Taking Active Self  nitroGLYCERIN (NITROSTAT) 0.4 MG SL tablet [id number] Yes Place 1 tablet (0.4 mg total) under the tongue every 5 (five) minutes as needed for chest pain. Swayze, Ava, DO Taking Active   NONFORMULARY OR COMPOUNDED ITEM [id number] Yes Power wheelchair Carollee Herter, Alferd Apa, [place] Taking Active Self  NONFORMULARY OR COMPOUNDED ITEM [id number] Yes [facility] wheelchair Carollee Herter, [place] R, [place] Taking Active Self  omeprazole (PRILOSEC OTC) 20 MG tablet [id number] Yes Take 1 tablet (20 mg total) by mouth daily. Swayze, Ava, DO Taking Active   polyethylene glycol (MIRALAX / GLYCOLAX) 17 g packet [id number] Yes Take 17 g by mouth daily as needed for mild constipation. Davonna Belling, MD Taking Active Self  pravastatin (PRAVACHOL) 40 MG tablet [id number] Yes TAKE 1 TABLET DAILY Carollee Herter, Alferd Apa, DO Taking Active Self  sucralfate (CARAFATE) 1 g tablet [id number]  Take 1 tablet (1 g total) by mouth 4 (four) times daily -  with meals and at bedtime for 7 days. Wynona Dove A, DO  Expired 10/16/21 2359   tamsulosin (FLOMAX) 0.4 MG CAPS capsule [id number] Yes TAKE ONE CAPSULE BY MOUTH ONE TIME DAILY [name], DO Taking Active Self  traMADol (ULTRAM) 50 MG tablet [id number] Yes 2 po q6 h prn  Patient taking differently: Take 100 mg by mouth every 6 (six) hours as needed for moderate pain.   [name], DO Taking Active Self  Patient Active Problem List   Diagnosis  Date Noted   Elevated troponin 09/28/2021   BPH (benign prostatic hyperplasia) 09/28/2021   Chronic diastolic CHF (congestive heart failure) ([place]) 09/28/2021   CIDP (chronic inflammatory demyelinating polyneuropathy) ([place]) 01/12/2021   Diabetes mellitus ([place]) 01/03/2021   Uncontrolled type 2 diabetes mellitus with hyperglycemia, with long-term current use of insulin ([place]) 01/03/2021   Lower extremity edema 11/28/2020   Chronic low back pain 11/28/2020   Psoriatic arthritis ([place]) 11/28/2020   Thoracic aortic aneurysm without rupture 11/28/2020   Bilateral leg weakness 11/05/2020   Acute left-sided low back pain with right-sided sciatica 07/19/2020   Hip pain, acute, left 07/05/2020   Tremor 07/05/2020   Weakness 07/05/2020   Balance problem 03/11/2020   Tinea cruris 03/11/2020   Cellulitis of left groin 03/11/2020   Acute pain of right shoulder 03/11/2020   Interstitial lung disease ([place]) 05/02/2017   Pansinusitis 09/26/2016   Diabetic polyneuropathy associated with diabetes mellitus due to underlying condition ([place]) 05/08/2016   Methamphetamine use disorder, severe, dependence ([place]) 02/11/2016   Substance induced mood disorder ([place]) 02/11/2016   Exertional dyspnea 11/30/2015   ADD (attention deficit disorder) 09/29/2015   Binge eating 09/29/2015   Syncope 05/05/2012   Diarrhea 05/05/2012   Panic attacks 05/05/2012   OSA (obstructive sleep apnea) 05/05/2012   Essential hypertension 05/05/2012   Dyslipidemia 05/05/2012   Coronary artery disease involving native coronary artery of native heart 04/01/2012   Acute left flank pain 04/01/2012   Drug abuse and dependence ([place]) 04/01/2012   Family history of coronary artery disease 03/31/2012   Sleep apnea, on C-pap 03/31/2012   Hyperlipemia 03/30/2012   HTN (hypertension), benign 03/30/2012   Morbid obesity ([place]) 03/30/2012   DM type 2, uncontrolled, with neuropathy ([place]) 03/30/2012   Depression with suicidal ideation 03/30/2012     Immunization History  Administered Date(s) Administered   Tdap 08/20/2014    Conditions to be addressed/monitored: CAD, HTN, HLD, DMII and chronic pain (low back) interstitial lung disease; OSA  Care Plan : General Pharmacy (Adult)  Updates made by Cherre Robins, RPH-CPP since 10/20/2021 12:00 AM     Problem: Medication management and Chronic Care Management, support and education for the following conditions - HTN, Type2 DM with long term insuiln use; hyperlipidemia; neuropathy; OSA; institial lung disease;   Priority: High  Onset Date: 01/06/2021  Note:   Current Barriers:  Difficulty with affording treatment regimen -improved Unable to independently monitor therapeutic efficacy - improved Unable to achieve control of type 2 DM and HTN - improved Does not adhere to prescribed medication regimen - improved Frequent ED visits   Pharmacist Clinical Goal(s):  Over the next 45 days, patient will verbalize ability to afford treatment regimen achieve adherence to monitoring guidelines and medication adherence to achieve therapeutic efficacy achieve control of type 2 DM and HTN as evidenced by attainment of goals listed below Decrease frequency of ED visits  through collaboration with PharmD and provider.   Interventions: 1:1 collaboration with Carollee Herter, Alferd Apa, DO regarding development and update of comprehensive plan of care as evidenced by provider attestation and co-signature Inter-disciplinary care team collaboration (see longitudinal plan of care) Comprehensive medication review performed; medication list updated in electronic medical record    Diabetes: Lab Results  Component Value Date   HGBA1C 8.5 (H) 09/29/2021  A1c 03/23/2021 [facility] was 6.9%  Uncontrolled - (goal A1c <7%) Current treatment: Metformin XR 596m - 2 Tablets with Breakfast and 2 tablets  with [facility] Housekeeper  Tyler Aas Flex Pen - inject 30 units once a day Novolog Flex Pen - inject 20 units with  breakfast, 24 units with lunch and 24 units with supper.  Use the following correction scale for NovoLog only before meals.    Novolog correctional insulin: ADD extra units on insulin to your meal-time novolog dose if your blood sugars are higher than 150:      Blood sugar before meal Number of units to inject  Less than 150 0 unit  151 -  170 1 units  171 -  190 2 units  191 -  210 3 units  211 -  230 4 units  231 -  250 5 units  251 -  270 6 units  271 -  290 7 units  291 -  310 8 units  311 - 330 9 units   Interventions / Recommendations Recommended continue to take medications as directed by Dr [name] Continue to check BG frequently during day with [facility].   Hypertension / Edema:  BP Readings from Last 3 Encounters:  10/09/21 (!) 155/90  09/29/21 130/60  09/08/21 132/82  Recent blood pressure elevate when presented to ED with pain - BP goal <140/90 Not checking BP at home regularly  Current treatment: Amlodipine 49m daily  Hydralazine 541mevery 8 hours Losartan 10072maily  Furosemide 19m60mily Denies dizziness  Interventions:  Counseled on importance of maintaining blood pressure at goal to prevent strokes and kidney damage  Reviewed refill history. Patient has filled ann blood pressure medications on time so far in 2023.    Hyperlipidemia / Heart Disease: Lipid Panel  Lab Results  Component Value Date   CHOL 140 05/18/2021   HDL 56.60 05/18/2021   LDLCALC 63 05/18/2021   LDLDIRECT 124.0 05/02/2017   TRIG 104.0 05/18/2021   CHOLHDL 2 05/18/2021  Controlled - LDL goal <70 Current treatment: Pravastatin 19mg58mbedtime Interventions:  Counseled on LDL goals Recommended continue current regimen for cholesterol  Chronic Pain / Leg Weakness / CIDP: Currently not controlled but improving per patient since started IVIG treatment Recent IVIG treatment for CIDP increased blood pressure. In future will receive treatment over 2 days every 30 days.   Patient reports he was doing better until he fell and fractured his foot.  Current treatment:  Methocarbamol 500mg 19mke 1 tablet 4 times a day as needed for muscle spasms Voltaren Gel 1% -  apply to area of pain up to 4 times a day as needed Gabapentin 300mg -47me 2 capsules 3 times a day Duloxetine 60mg da83min morning IVIG infusion every 30 days over 2 days Interventions:  Continue current regimen for pain  Encouraged patient to follow up with neurology and pain management  Anxiety / Insomnia:  Uncontrolled Current therapy:   Hydroxyzine 25mg tak78m to every 6 hour as needed  Duloxetine 60mg dail76m morning Medication tried in past:  Trazodone - made him feel like a zombie Hydroxyzine - made him sleepy ("like a benadryl on steroids") Ativan - worked well Citalopram - not helpful Clonazepam - made him moody Escitalopram - not helpful Nortriptyline - made him sleep Interventions: (addressed at previous visit) Continue to take duloxetine in morning.  Continue hydroxyzine as needed   Medication Management:  Interventions:   Reviewed refill history and discussed which medications will have refills due soon.  Will continue to follow adherence and assist patient with refills. Patient having difficulty getting sucralfate filled. Reviewed [employer]  formulary and cost should be $5 for 30 days at [employer].   Patient Goals/Self-Care Activities Over the next 90 days, patient will:  take medications as prescribed focus on medication adherence by utilizing pharmacy service that delivers medications to his home, check glucose before meals and 2 hour after meals using CGM, document, and provide at future appointments Check blood pressure 2 to 3 times per week, record for future appointments  Follow up call planned In 6 weeks      Medication Assistance: None needed at this time .  Patient's preferred pharmacy is:  [id number] - [place], [place] - [address]  AT [place] [address] [place]  [address] Phone: [phone] Fax: [phone]  [facility] [place] [place] [facility] [address] [place] [address] Phone: [phone] Fax: [phone]  Follow Up:  Patient agrees to Care Plan and Follow-up.  Plan: The care management team will reach out to the patient again in 6 weeks.   Cherre Robins, PharmD Clinical Pharmacist [facility] [phone]

## 2021-10-20 NOTE — Patient Instructions (Signed)
Mr. Reichenberger,  ?It was a pleasure speaking with you today.  ?I have attached a summary of our visit today and information about your health goals.  ?(See Below)  ? ?If you have any questions or concerns, please feel free to contact me either at the phone number below or with a MyChart message.  ? ?Keep up the good work! ? ?Cherre Robins, PharmD ?Clinical Pharmacist ?[facility] ?[place] ?[phone] (direct line)  ?[phone] (main office number) ? ? ?Patient verbalizes understanding of instructions and care plan provided today and agrees to view in [place]. Active MyChart status confirmed with patient.    ?

## 2021-10-24 DIAGNOSIS — M5441 Lumbago with sciatica, right side: Secondary | ICD-10-CM | POA: Diagnosis not present

## 2021-10-24 DIAGNOSIS — G6181 Chronic inflammatory demyelinating polyneuritis: Secondary | ICD-10-CM | POA: Diagnosis not present

## 2021-10-24 DIAGNOSIS — E1142 Type 2 diabetes mellitus with diabetic polyneuropathy: Secondary | ICD-10-CM | POA: Diagnosis not present

## 2021-10-24 DIAGNOSIS — E1121 Type 2 diabetes mellitus with diabetic nephropathy: Secondary | ICD-10-CM | POA: Diagnosis not present

## 2021-10-24 DIAGNOSIS — E1136 Type 2 diabetes mellitus with diabetic cataract: Secondary | ICD-10-CM | POA: Diagnosis not present

## 2021-10-24 DIAGNOSIS — Z6841 Body Mass Index (BMI) 40.0 and over, adult: Secondary | ICD-10-CM | POA: Diagnosis not present

## 2021-10-24 DIAGNOSIS — J849 Interstitial pulmonary disease, unspecified: Secondary | ICD-10-CM | POA: Diagnosis not present

## 2021-10-24 DIAGNOSIS — E7800 Pure hypercholesterolemia, unspecified: Secondary | ICD-10-CM | POA: Diagnosis not present

## 2021-10-24 DIAGNOSIS — M1990 Unspecified osteoarthritis, unspecified site: Secondary | ICD-10-CM | POA: Diagnosis not present

## 2021-10-24 DIAGNOSIS — Z87891 Personal history of nicotine dependence: Secondary | ICD-10-CM | POA: Diagnosis not present

## 2021-10-24 DIAGNOSIS — F32A Depression, unspecified: Secondary | ICD-10-CM | POA: Diagnosis not present

## 2021-10-24 DIAGNOSIS — F410 Panic disorder [episodic paroxysmal anxiety] without agoraphobia: Secondary | ICD-10-CM | POA: Diagnosis not present

## 2021-10-24 DIAGNOSIS — G4733 Obstructive sleep apnea (adult) (pediatric): Secondary | ICD-10-CM | POA: Diagnosis not present

## 2021-10-24 DIAGNOSIS — I2510 Atherosclerotic heart disease of native coronary artery without angina pectoris: Secondary | ICD-10-CM | POA: Diagnosis not present

## 2021-10-24 DIAGNOSIS — N39498 Other specified urinary incontinence: Secondary | ICD-10-CM | POA: Diagnosis not present

## 2021-10-24 DIAGNOSIS — F988 Other specified behavioral and emotional disorders with onset usually occurring in childhood and adolescence: Secondary | ICD-10-CM | POA: Diagnosis not present

## 2021-10-24 DIAGNOSIS — K589 Irritable bowel syndrome without diarrhea: Secondary | ICD-10-CM | POA: Diagnosis not present

## 2021-10-24 DIAGNOSIS — N401 Enlarged prostate with lower urinary tract symptoms: Secondary | ICD-10-CM | POA: Diagnosis not present

## 2021-10-24 DIAGNOSIS — Z7984 Long term (current) use of oral hypoglycemic drugs: Secondary | ICD-10-CM | POA: Diagnosis not present

## 2021-10-24 DIAGNOSIS — I10 Essential (primary) hypertension: Secondary | ICD-10-CM | POA: Diagnosis not present

## 2021-10-24 DIAGNOSIS — Z794 Long term (current) use of insulin: Secondary | ICD-10-CM | POA: Diagnosis not present

## 2021-10-25 ENCOUNTER — Emergency Department (HOSPITAL_COMMUNITY): Payer: Medicare HMO

## 2021-10-25 ENCOUNTER — Encounter (HOSPITAL_COMMUNITY): Payer: Self-pay

## 2021-10-25 ENCOUNTER — Other Ambulatory Visit: Payer: Self-pay

## 2021-10-25 ENCOUNTER — Emergency Department (HOSPITAL_COMMUNITY)
Admission: EM | Admit: 2021-10-25 | Discharge: 2021-10-25 | Disposition: A | Discharge status: Home / Self Care | Payer: Medicare HMO | Attending: Emergency Medicine | Admitting: Emergency Medicine

## 2021-10-25 DIAGNOSIS — R1012 Left upper quadrant pain: Secondary | ICD-10-CM | POA: Diagnosis not present

## 2021-10-25 DIAGNOSIS — R1013 Epigastric pain: Secondary | ICD-10-CM | POA: Diagnosis not present

## 2021-10-25 DIAGNOSIS — Z794 Long term (current) use of insulin: Secondary | ICD-10-CM | POA: Diagnosis not present

## 2021-10-25 DIAGNOSIS — Z743 Need for continuous supervision: Secondary | ICD-10-CM | POA: Diagnosis not present

## 2021-10-25 DIAGNOSIS — E871 Hypo-osmolality and hyponatremia: Secondary | ICD-10-CM | POA: Diagnosis not present

## 2021-10-25 DIAGNOSIS — R079 Chest pain, unspecified: Secondary | ICD-10-CM | POA: Diagnosis not present

## 2021-10-25 DIAGNOSIS — R069 Unspecified abnormalities of breathing: Secondary | ICD-10-CM | POA: Diagnosis not present

## 2021-10-25 DIAGNOSIS — R0789 Other chest pain: Secondary | ICD-10-CM | POA: Insufficient documentation

## 2021-10-25 DIAGNOSIS — Z7982 Long term (current) use of aspirin: Secondary | ICD-10-CM | POA: Diagnosis not present

## 2021-10-25 DIAGNOSIS — R7309 Other abnormal glucose: Secondary | ICD-10-CM | POA: Diagnosis not present

## 2021-10-25 DIAGNOSIS — Z7984 Long term (current) use of oral hypoglycemic drugs: Secondary | ICD-10-CM | POA: Diagnosis not present

## 2021-10-25 DIAGNOSIS — R739 Hyperglycemia, unspecified: Secondary | ICD-10-CM | POA: Diagnosis not present

## 2021-10-25 LAB — CBC WITH DIFFERENTIAL/PLATELET
Abs Immature Granulocytes: 0.03 10*3/uL (ref 0.00–0.07)
Basophils Absolute: 0.1 10*3/uL (ref 0.0–0.1)
Basophils Relative: 1 %
Eosinophils Absolute: 0.3 10*3/uL (ref 0.0–0.5)
Eosinophils Relative: 7 %
HCT: 40.7 % (ref 39.0–52.0)
Hemoglobin: 13.5 g/dL (ref 13.0–17.0)
Immature Granulocytes: 1 %
Lymphocytes Relative: 30 %
Lymphs Abs: 1.6 10*3/uL (ref 0.7–4.0)
MCH: 29.1 pg (ref 26.0–34.0)
MCHC: 33.2 g/dL (ref 30.0–36.0)
MCV: 87.7 fL (ref 80.0–100.0)
Monocytes Absolute: 0.6 10*3/uL (ref 0.1–1.0)
Monocytes Relative: 12 %
Neutro Abs: 2.6 10*3/uL (ref 1.7–7.7)
Neutrophils Relative %: 49 %
Platelets: 242 10*3/uL (ref 150–400)
RBC: 4.64 MIL/uL (ref 4.22–5.81)
RDW: 12.5 % (ref 11.5–15.5)
WBC: 5.2 10*3/uL (ref 4.0–10.5)
nRBC: 0 % (ref 0.0–0.2)

## 2021-10-25 LAB — COMPREHENSIVE METABOLIC PANEL
ALT: 25 U/L (ref 0–44)
AST: 18 U/L (ref 15–41)
Albumin: 3.3 g/dL — ABNORMAL LOW (ref 3.5–5.0)
Alkaline Phosphatase: 72 U/L (ref 38–126)
Anion gap: 8 (ref 5–15)
BUN: 21 mg/dL — ABNORMAL HIGH (ref 6–20)
CO2: 26 mmol/L (ref 22–32)
Calcium: 9.3 mg/dL (ref 8.9–10.3)
Chloride: 100 mmol/L (ref 98–111)
Creatinine, Ser: 0.94 mg/dL (ref 0.61–1.24)
GFR, Estimated: >60 mL/min (ref >60)
Glucose, Bld: 280 mg/dL — ABNORMAL HIGH (ref 70–99)
Potassium: 4 mmol/L (ref 3.5–5.1)
Sodium: 134 mmol/L — ABNORMAL LOW (ref 135–145)
Total Bilirubin: 0.5 mg/dL (ref 0.3–1.2)
Total Protein: 7.1 g/dL (ref 6.5–8.1)

## 2021-10-25 LAB — TROPONIN I (HIGH SENSITIVITY)
Troponin I (High Sensitivity): 8 ng/L (ref <18)
Troponin I (High Sensitivity): 8 ng/L (ref <18)

## 2021-10-25 LAB — LIPASE, BLOOD: Lipase: 34 U/L (ref 11–51)

## 2021-10-25 IMAGING — DX DG CHEST 1V PORT
1 series · 1 of 1 positions shown · non-contrast
Comparison: [DATE].

CLINICAL DATA: Chest pain.

EXAM:
PORTABLE CHEST 1 VIEW

[chest ap]
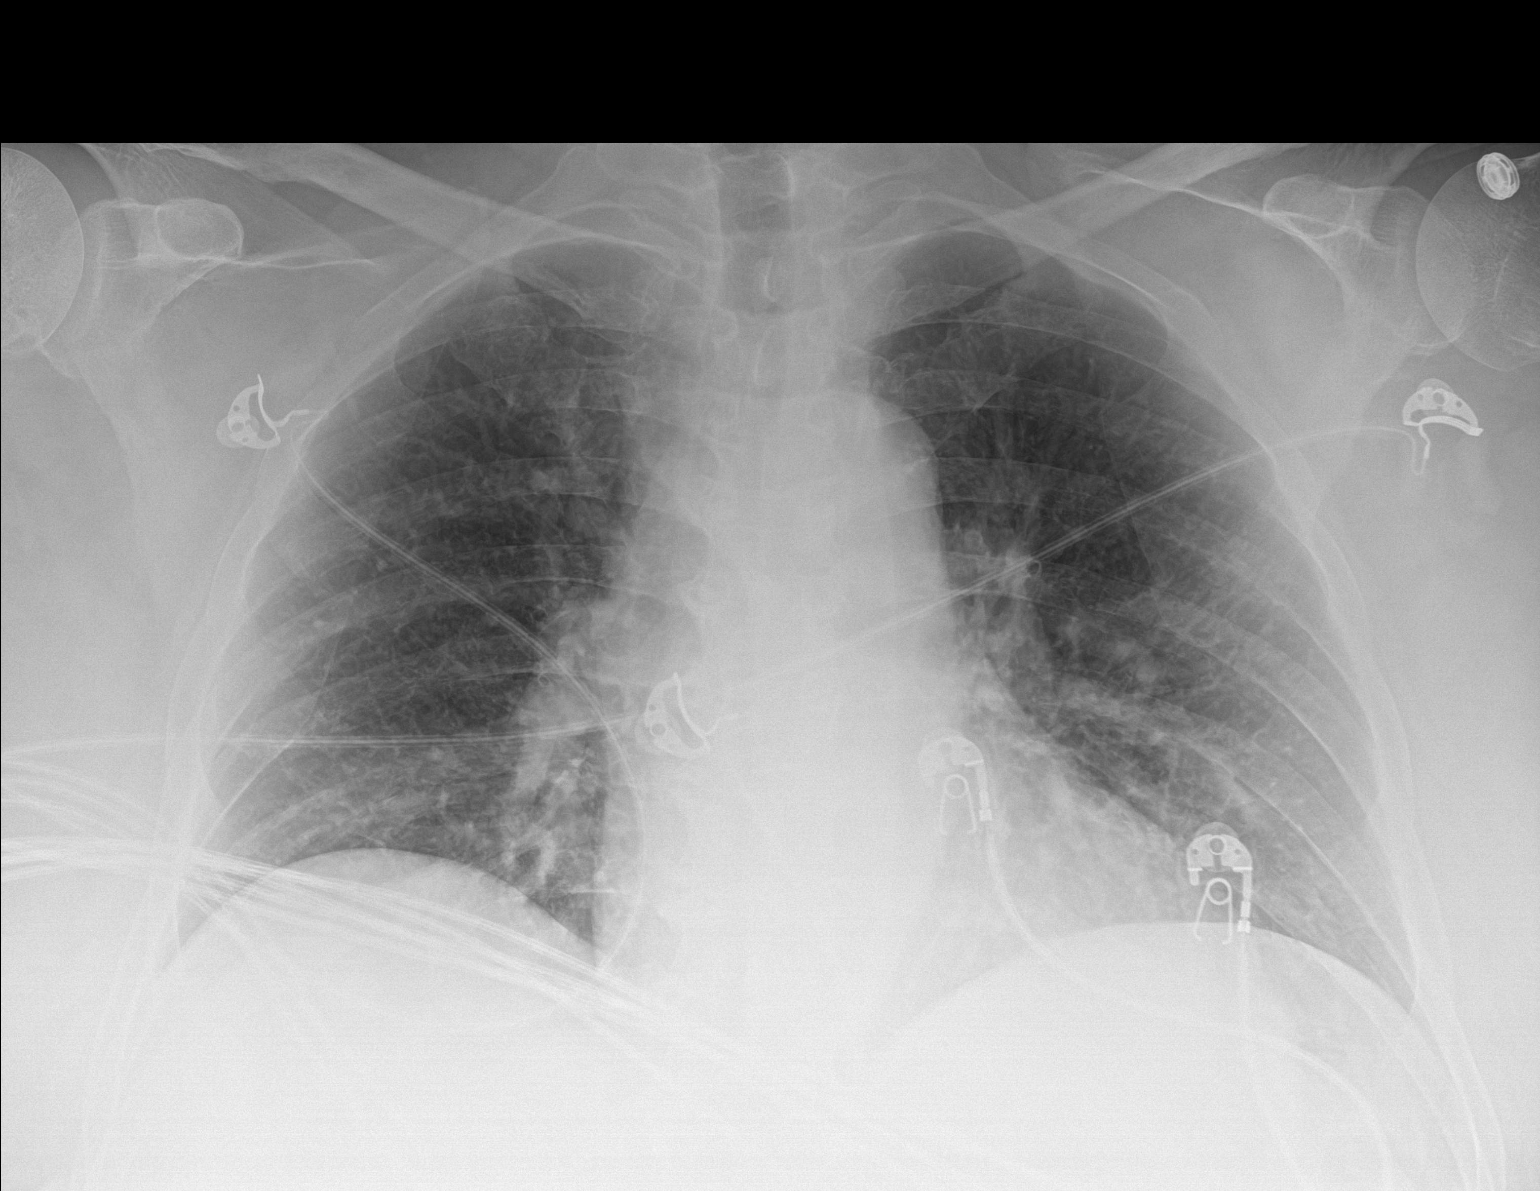

[1 of 1 positions shown; findings below may reference images not displayed]

FINDINGS: The heart size and mediastinal contours are within normal limits.
Both lungs are clear. The visualized skeletal structures are
unremarkable.
IMPRESSION: No active disease.

## 2021-10-25 MED ORDER — ALUM & MAG HYDROXIDE-SIMETH 200-200-20 MG/5ML PO SUSP
30.0000 mL | Freq: Once | ORAL | Status: AC
Start: 2021-10-25 — End: 2021-10-25
  Administered 2021-10-25: 30 mL via ORAL
  Filled 2021-10-25: qty 30

## 2021-10-25 MED ORDER — DICLOFENAC SODIUM 1 % EX GEL: 4.0000 g | Freq: Four times a day (QID) | CUTANEOUS | 0 refills | Status: AC

## 2021-10-25 NOTE — ED Triage Notes (Signed)
Pt BIB EMS for CP x3weeks, cough 3 days, roommate's have similar cough but tested negative for Covid. Pt reports he was unable to sleep last night due to chest pain, "feels like someone standing on my chest when you press on it"  ?

## 2021-10-25 NOTE — Discharge Instructions (Addendum)
Try pepcid or tagamet up to twice a day.  Try to avoid things that may make this worse, most commonly these are spicy foods tomato based products fatty foods chocolate and peppermint.  Alcohol and tobacco can also make this worse.  Return to the emergency department for sudden worsening pain fever or inability to eat or drink. ? ?I have also prescribed a gel that he can try rubbing right where it hurts.  Follow-up with your family doctor in the office. ? ? ?

## 2021-10-25 NOTE — ED Provider Notes (Signed)
[facility] EMERGENCY DEPARTMENT Provider Note   CSN: [id number] Arrival date & time: 10/25/21  0805     History  Chief Complaint  Patient presents with   Chest Pain    Tyler Brewer is a 61 y.o. male.  61 yo M with a chief complaints of epigastric and left upper quadrant discomfort.  This has been an ongoing issue for him.  Going on for at least the past 3 weeks he said.  Seems to come and go.  Worse when he sits up and when he tries to get up and move around.  He is chronically debilitated and has some difficulty getting up and walking at baseline but feels like that is improved over the past year or so.  He has developed a cough over the past couple weeks or so did a home COVID test that was negative.  He has been to the emergency department for pain like this before and has had a CT scan and the ultrasound of his heart done recentlythat were reportedly unremarkable.   Chest Pain     Home Medications Prior to Admission medications   Medication Sig Start Date End Date Taking? Authorizing Provider  diclofenac Sodium (VOLTAREN) 1 % GEL Apply 4 g topically 4 (four) times daily. 10/25/21  Yes Melene Plan, DO  acetaminophen (TYLENOL) 500 MG tablet Take 500-1,000 mg by mouth every 6 (six) hours as needed for mild pain.    [provider]  AMBULATORY NON FORMULARY MEDICATION Medication Name: [facility] Wheelchair 07/31/21   Zola Button, Grayling Congress, DO  amLODipine (NORVASC) 10 MG tablet Take 1 tablet (10 mg total) by mouth daily. 08/24/21   Donato Schultz, DO  aspirin EC 81 MG tablet Take 1 tablet (81 mg total) by mouth daily. Swallow whole. 09/29/21 09/29/22  Swayze, Ava, DO  calcium carbonate (TUMS - DOSED IN MG ELEMENTAL CALCIUM) 500 MG chewable tablet Chew 1 tablet (200 mg of elemental calcium total) by mouth every 6 (six) hours as needed for indigestion or heartburn. 09/29/21   Swayze, Ava, DO  Cyanocobalamin (VITAMIN B-12 PO) Take 1 tablet by mouth daily.     [provider]  DULoxetine (CYMBALTA) 60 MG capsule Take 60 mg by mouth daily. 05/06/21   [provider]  furosemide (LASIX) 20 MG tablet Take 1 tablet (20 mg total) by mouth 2 (two) times daily. 09/29/21   Swayze, Ava, DO  gabapentin (NEURONTIN) 300 MG capsule Take 2 capsules (600 mg total) by mouth 3 (three) times daily. 09/29/21   Swayze, Ava, DO  hydrALAZINE (APRESOLINE) 50 MG tablet TAKE 1 TABLET(50 MG) BY MOUTH EVERY 8 HOURS 08/01/21   Zola Button, Grayling Congress, DO  hydrOXYzine (ATARAX) 25 MG tablet Take 1 tablet (25 mg total) by mouth every 6 (six) hours as needed for anxiety. 08/09/21   Donato Schultz, DO  immune globulin, human, (GAMMAGARD S/D) 5 g injection Inject into the vein every 21 ( twenty-one) days. Patient not sure of dose    [provider]  insulin aspart (NOVOLOG FLEXPEN) 100 UNIT/ML FlexPen Max daily 80 units per scale Patient taking differently: Inject 0-80 Units into the skin See admin instructions. Max daily 80 units per scale 08/24/21   [name], Konrad Dolores, MD  insulin degludec (TRESIBA FLEXTOUCH) 100 UNIT/ML FlexTouch Pen Inject 30 Units into the skin daily. 08/24/21   [name], Konrad Dolores, MD  Insulin Pen Needle 31G X 5 MM MISC 1 Device by Does not apply  route in the morning, at noon, in the evening, and at bedtime. 08/24/21   [name], Konrad Dolores, MD  lidocaine (LIDODERM) 5 % Place 1 patch onto the skin daily. Remove & Discard patch within 12 hours or as directed by MD 09/29/21   Swayze, Ava, DO  losartan (COZAAR) 100 MG tablet TAKE ONE TABLET BY MOUTH DAILY 04/18/21   Zola Button, Grayling Congress, DO  MAGNESIUM PO Take 1 tablet by mouth daily.    [provider]  metFORMIN (GLUCOPHAGE-XR) 500 MG 24 hr tablet TAKE 2 TABLETS IN THE      MORNING AND AT BEDTIME 09/26/21   [name], Konrad Dolores, MD  methocarbamol (ROBAXIN) 500 MG tablet Take 500 mg by mouth 4 (four) times daily as needed for muscle spasms.    [provider]  metoprolol tartrate (LOPRESSOR) 100 MG tablet Take 1 tablet (100 mg total) by mouth once for 1 dose. Take 1.5-2 hours before coronary CTA. 09/29/21 09/29/21  Marjie Skiff E, PA-C  Multiple Vitamin (MULTIVITAMIN) tablet Take 1 tablet by mouth daily.    [provider]  nitroGLYCERIN (NITROSTAT) 0.4 MG SL tablet Place 1 tablet (0.4 mg total) under the tongue every 5 (five) minutes as needed for chest pain. 09/29/21   Swayze, Ava, DO  NONFORMULARY OR COMPOUNDED ITEM Power wheelchair 06/15/21   Zola Button, Grayling Congress, DO  NONFORMULARY OR COMPOUNDED Benita Gutter wheelchair 08/04/21   Zola Button, Grayling Congress, DO  omeprazole (PRILOSEC OTC) 20 MG tablet Take 1 tablet (20 mg total) by mouth daily. 09/29/21 09/29/22  Swayze, Ava, DO  polyethylene glycol (MIRALAX / GLYCOLAX) 17 g packet Take 17 g by mouth daily as needed for mild constipation. 07/21/21   Benjiman Core, MD  pravastatin (PRAVACHOL) 40 MG tablet TAKE 1 TABLET DAILY 09/25/21   Zola Button, Grayling Congress, DO  sucralfate (CARAFATE) 1 g tablet Take 1 tablet (1 g total) by mouth 4 (four) times daily -  with meals and at bedtime for 7 days. 10/09/21 10/16/21  Sloan Leiter, DO  tamsulosin (FLOMAX) 0.4 MG CAPS capsule TAKE ONE CAPSULE BY MOUTH ONE TIME DAILY 04/18/21   Zola Button, Grayling Congress, DO  traMADol (ULTRAM) 50 MG tablet 2 po q6 h prn Patient taking differently: Take 100 mg by mouth every 6 (six) hours as needed for moderate pain. 09/08/21   Donato Schultz, DO      Allergies    Patient has no known allergies.    Review of Systems   Review of Systems  Cardiovascular:  Positive for chest pain.   Physical Exam Updated Vital Signs BP 135/86   Pulse 77   Temp 97.8 F (36.6 C) (Oral)   Resp 17   SpO2 97%  Physical Exam Vitals and nursing note reviewed.  Constitutional:      Appearance: He is well-developed.     Comments: BMI 43  HENT:     Head: Normocephalic and atraumatic.  Eyes:     Pupils: Pupils are equal,  round, and reactive to light.  Neck:     Vascular: No JVD.  Cardiovascular:     Rate and Rhythm: Normal rate and regular rhythm.     Heart sounds: No murmur heard.   No friction rub. No gallop.  Pulmonary:     Effort: No respiratory distress.     Breath sounds: No wheezing.  Chest:     Chest wall: Tenderness present.     Comments: Tenderness about the right anterior  chest wall about the midclavicular line about ribs 6 through 8. Abdominal:     General: There is no distension.     Tenderness: There is abdominal tenderness. There is no guarding or rebound.     Comments: Pain about the epigastrium  Musculoskeletal:        General: Normal range of motion.     Cervical back: Normal range of motion and neck supple.  Skin:    Coloration: Skin is not pale.     Findings: No rash.  Neurological:     Mental Status: He is alert and oriented to person, place, and time.  Psychiatric:        Behavior: Behavior normal.    ED Results / Procedures / Treatments   Labs (all labs ordered are listed, but only abnormal results are displayed) Labs Reviewed  COMPREHENSIVE METABOLIC PANEL - Abnormal; Notable for the following components:      Result Value   Sodium 134 (*)    Glucose, Bld 280 (*)    BUN 21 (*)    Albumin 3.3 (*)    All other components within normal limits  CBC WITH DIFFERENTIAL/PLATELET  LIPASE, BLOOD  TROPONIN I (HIGH SENSITIVITY)  TROPONIN I (HIGH SENSITIVITY)    EKG EKG Interpretation  Date/Time:  Wednesday October 25 2021 08:11:25 EST Ventricular Rate:  85 PR Interval:  169 QRS Duration: 101 QT Interval:  351 QTC Calculation: 418 R Axis:   13 Text Interpretation: Sinus rhythm Low voltage, precordial leads No significant change since last tracing Confirmed by Melene Plan [phone]) on 10/25/2021 9:19:08 AM  Radiology DG Chest Port 1 View  Result Date: 10/25/2021 CLINICAL DATA:  Chest pain. EXAM: PORTABLE CHEST 1 VIEW COMPARISON:  October 09, 2021. FINDINGS: The heart  size and mediastinal contours are within normal limits. Both lungs are clear. The visualized skeletal structures are unremarkable. IMPRESSION: No active disease. Electronically Signed   By: Lupita Raider M.D.   On: 10/25/2021 08:32    Procedures Procedures    Medications Ordered in ED Medications  alum & mag hydroxide-simeth (MAALOX/MYLANTA) 200-200-20 MG/5ML suspension 30 mL (30 mLs Oral Given 10/25/21 6301)    ED Course/ Medical Decision Making/ A&P                           Medical Decision Making Amount and/or Complexity of Data Reviewed Labs: ordered. Radiology: ordered.  Risk OTC drugs.   Patient is a 61 y.o. male with a cc of chest pain.  This is atypical in nature and reproduced on exam.  Is been going on for weeks.  He has been to the emergency department for this previously and had a CT angiogram of the chest as well as an echocardiogram.  At one point he was admitted for this seen by cardiology and thought to be less likely to be cardiac in origin.  It does not seem like he is followed up with.  I was concerned for the possibility of acs and so pursed a further workup.  He had a CT angiogram of the chest for similar symptoms done less than a month ago that was negative I feel the likelihood of pulmonary embolism is low and no further work-up is required.  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  I independently interpreted the patients labs and imaging chest x-ray independently interpreted by me without focal infiltrate  or pneumothorax.  His blood work without anemia no leukocytosis very mild hyponatremia glucose mildly elevated otherwise no significant electrolyte abnormality.  2 troponins are negative.  Will discharge home.  PCP follow-up.  2:41 PM:  I have discussed the diagnosis/risks/treatment options with the patient.  Evaluation and diagnostic testing in the emergency department does not suggest an emergent  condition requiring admission or immediate intervention beyond what has been performed at this time.  They will follow up with  PCP. We also discussed returning to the ED immediately if new or worsening sx occur. We discussed the sx which are most concerning (e.g., sudden worsening pain, fever, inability to tolerate by mouth) that necessitate immediate return. Medications administered to the patient during their visit and any new prescriptions provided to the patient are listed below.  Medications given during this visit Medications  alum & mag hydroxide-simeth (MAALOX/MYLANTA) 200-200-20 MG/5ML suspension 30 mL (30 mLs Oral Given 10/25/21 0829)     The patient appears reasonably screen and/or stabilized for discharge and I doubt any other medical condition or other [facility] requiring further screening, evaluation, or treatment in the ED at this time prior to discharge.          Final Clinical Impression(s) / ED Diagnoses Final diagnoses:  Nonspecific chest pain    Rx / DC Orders ED Discharge Orders          Ordered    diclofenac Sodium (VOLTAREN) 1 % GEL  4 times daily        10/25/21 1151              [place], DO 10/25/21 1441

## 2021-10-26 ENCOUNTER — Telehealth: Payer: Self-pay

## 2021-10-26 NOTE — Telephone Encounter (Addendum)
Called to let him know he can call his  insurance to inquire about nonemergency rides to his doctor visits. He states "I do not think they do that." Pt given a number from social work to also call for financial hardships. [employer] [phone] ext [phone]. Pt requested I send the information in a MyChart message since he does not have anything to write with at this time. Pt requested appt be moved to after March 22nd. His appt was moved to March 29th.  ?

## 2021-10-27 ENCOUNTER — Other Ambulatory Visit: Payer: Self-pay

## 2021-10-27 ENCOUNTER — Emergency Department (HOSPITAL_BASED_OUTPATIENT_CLINIC_OR_DEPARTMENT_OTHER)
Admission: EM | Admit: 2021-10-27 | Discharge: 2021-10-27 | Disposition: A | Discharge status: Home / Self Care | Payer: Medicare HMO | Attending: Emergency Medicine | Admitting: Emergency Medicine

## 2021-10-27 ENCOUNTER — Ambulatory Visit: Payer: Medicare HMO | Admitting: Student

## 2021-10-27 ENCOUNTER — Institutional Professional Consult (permissible substitution): Payer: Medicare HMO | Admitting: Pulmonary Disease

## 2021-10-27 ENCOUNTER — Encounter (HOSPITAL_BASED_OUTPATIENT_CLINIC_OR_DEPARTMENT_OTHER): Payer: Self-pay

## 2021-10-27 ENCOUNTER — Emergency Department (HOSPITAL_BASED_OUTPATIENT_CLINIC_OR_DEPARTMENT_OTHER): Payer: Medicare HMO

## 2021-10-27 DIAGNOSIS — R1013 Epigastric pain: Secondary | ICD-10-CM | POA: Diagnosis not present

## 2021-10-27 DIAGNOSIS — R0781 Pleurodynia: Secondary | ICD-10-CM | POA: Diagnosis not present

## 2021-10-27 DIAGNOSIS — M7989 Other specified soft tissue disorders: Secondary | ICD-10-CM | POA: Insufficient documentation

## 2021-10-27 DIAGNOSIS — R1010 Upper abdominal pain, unspecified: Secondary | ICD-10-CM | POA: Diagnosis present

## 2021-10-27 DIAGNOSIS — Z743 Need for continuous supervision: Secondary | ICD-10-CM | POA: Diagnosis not present

## 2021-10-27 DIAGNOSIS — I959 Hypotension, unspecified: Secondary | ICD-10-CM | POA: Diagnosis not present

## 2021-10-27 DIAGNOSIS — R739 Hyperglycemia, unspecified: Secondary | ICD-10-CM | POA: Diagnosis not present

## 2021-10-27 DIAGNOSIS — R1084 Generalized abdominal pain: Secondary | ICD-10-CM | POA: Diagnosis not present

## 2021-10-27 DIAGNOSIS — R1011 Right upper quadrant pain: Secondary | ICD-10-CM | POA: Diagnosis not present

## 2021-10-27 DIAGNOSIS — I10 Essential (primary) hypertension: Secondary | ICD-10-CM | POA: Diagnosis not present

## 2021-10-27 LAB — COMPREHENSIVE METABOLIC PANEL
ALT: 22 U/L (ref 0–44)
AST: 19 U/L (ref 15–41)
Albumin: 3.3 g/dL — ABNORMAL LOW (ref 3.5–5.0)
Alkaline Phosphatase: 75 U/L (ref 38–126)
Anion gap: 8 (ref 5–15)
BUN: 20 mg/dL (ref 6–20)
CO2: 23 mmol/L (ref 22–32)
Calcium: 8.5 mg/dL — ABNORMAL LOW (ref 8.9–10.3)
Chloride: 102 mmol/L (ref 98–111)
Creatinine, Ser: 0.84 mg/dL (ref 0.61–1.24)
GFR, Estimated: >60 mL/min (ref >60)
Glucose, Bld: 316 mg/dL — ABNORMAL HIGH (ref 70–99)
Potassium: 4.2 mmol/L (ref 3.5–5.1)
Sodium: 133 mmol/L — ABNORMAL LOW (ref 135–145)
Total Bilirubin: 0.3 mg/dL (ref 0.3–1.2)
Total Protein: 7.1 g/dL (ref 6.5–8.1)

## 2021-10-27 LAB — CBC WITH DIFFERENTIAL/PLATELET
Abs Immature Granulocytes: 0.02 10*3/uL (ref 0.00–0.07)
Basophils Absolute: 0.1 10*3/uL (ref 0.0–0.1)
Basophils Relative: 1 %
Eosinophils Absolute: 0.4 10*3/uL (ref 0.0–0.5)
Eosinophils Relative: 7 %
HCT: 40.8 % (ref 39.0–52.0)
Hemoglobin: 13.8 g/dL (ref 13.0–17.0)
Immature Granulocytes: 0 %
Lymphocytes Relative: 21 %
Lymphs Abs: 1.4 10*3/uL (ref 0.7–4.0)
MCH: 29.1 pg (ref 26.0–34.0)
MCHC: 33.8 g/dL (ref 30.0–36.0)
MCV: 86.1 fL (ref 80.0–100.0)
Monocytes Absolute: 0.6 10*3/uL (ref 0.1–1.0)
Monocytes Relative: 9 %
Neutro Abs: 4.1 10*3/uL (ref 1.7–7.7)
Neutrophils Relative %: 62 %
Platelets: 234 10*3/uL (ref 150–400)
RBC: 4.74 MIL/uL (ref 4.22–5.81)
RDW: 12.4 % (ref 11.5–15.5)
WBC: 6.6 10*3/uL (ref 4.0–10.5)
nRBC: 0 % (ref 0.0–0.2)

## 2021-10-27 LAB — LIPASE, BLOOD: Lipase: 31 U/L (ref 11–51)

## 2021-10-27 LAB — TROPONIN I (HIGH SENSITIVITY): Troponin I (High Sensitivity): 7 ng/L (ref <18)

## 2021-10-27 IMAGING — US US ABDOMEN LIMITED
1 series · 14 of 25 positions shown · non-contrast
Comparison: [DATE].

CLINICAL DATA: Right upper quadrant pain

EXAM:
ULTRASOUND ABDOMEN LIMITED RIGHT UPPER QUADRANT

[Series 1: us abdomen limited · 14 of 50 slices shown]
[im 1/50]
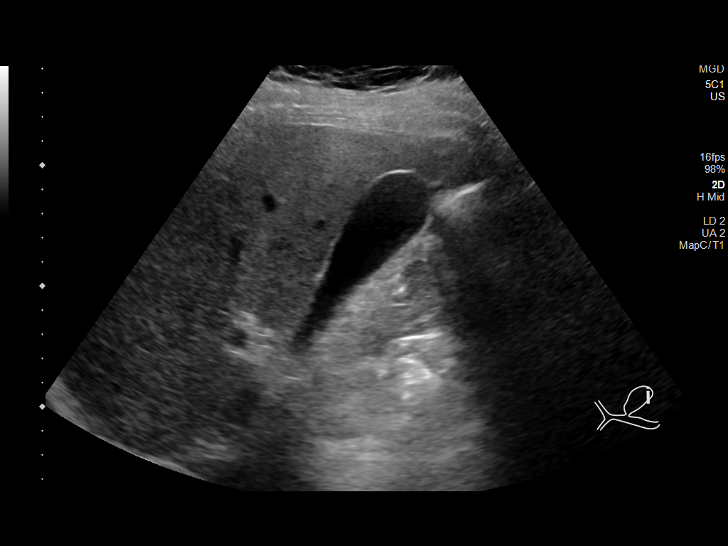
[im 5/50]
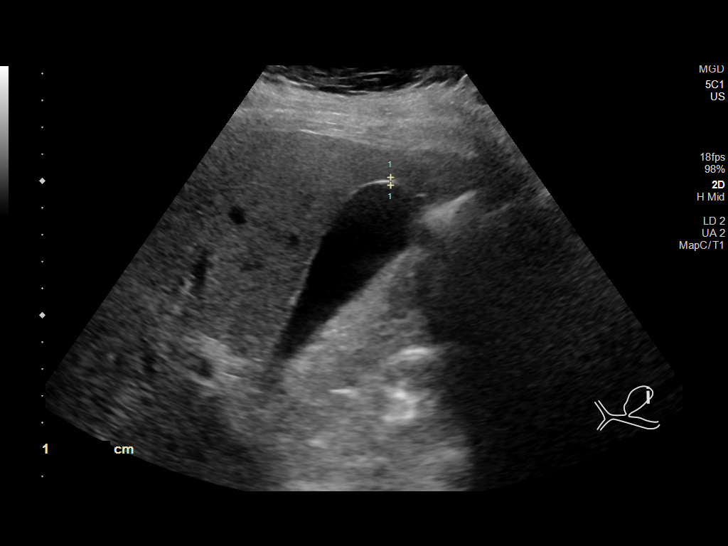
[im 9/50]
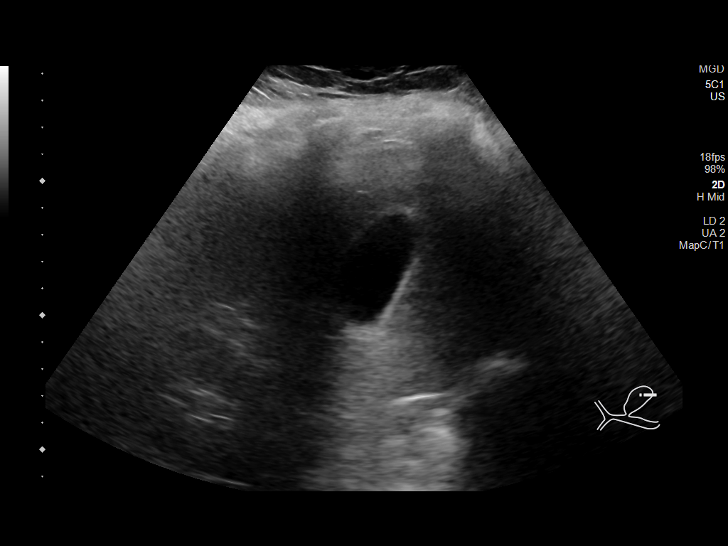
[im 13/50]
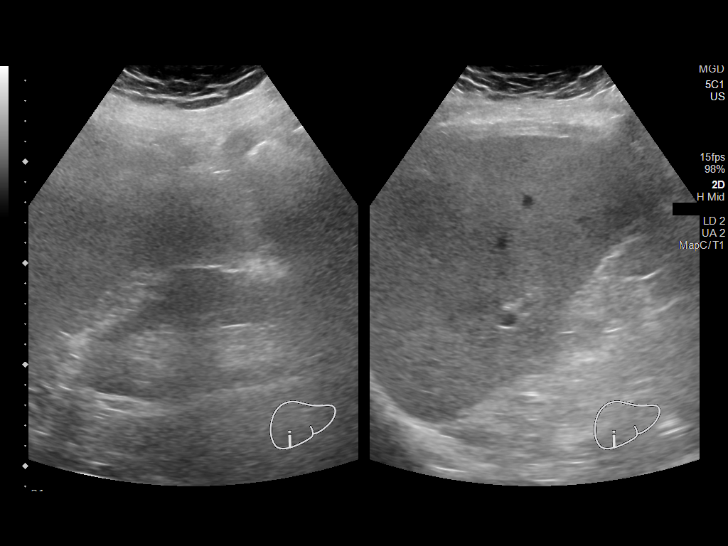
[im 17/50]
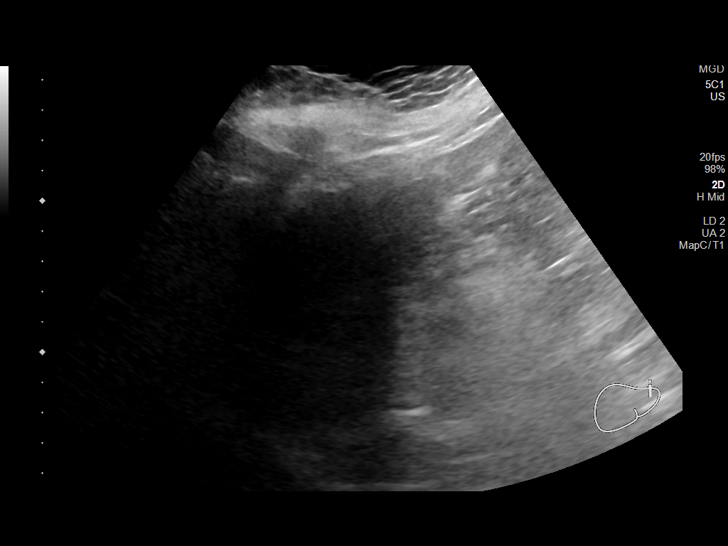
[im 19/50]
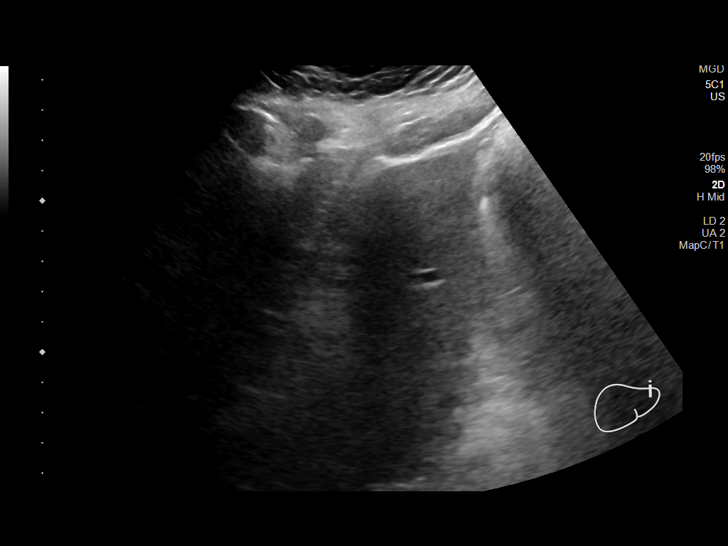
[im 23/50]
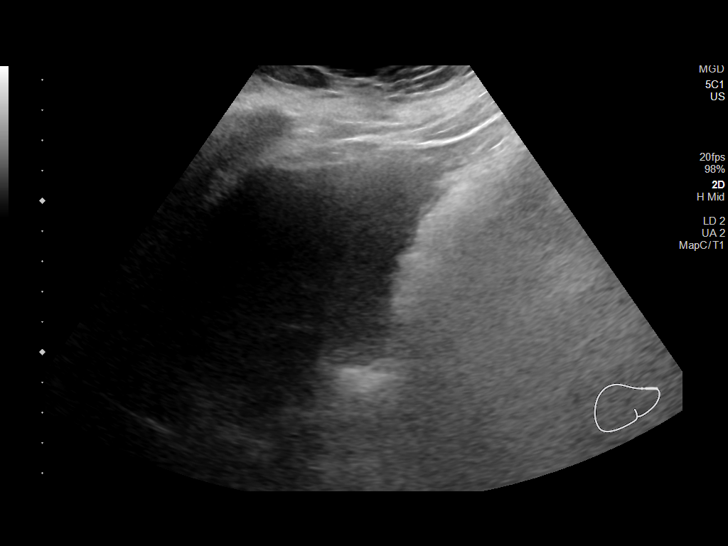
[im 27/50]
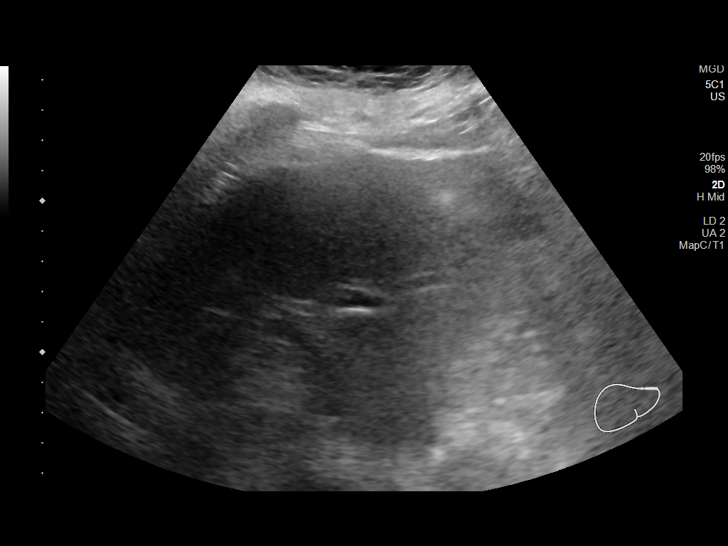
[im 31/50]
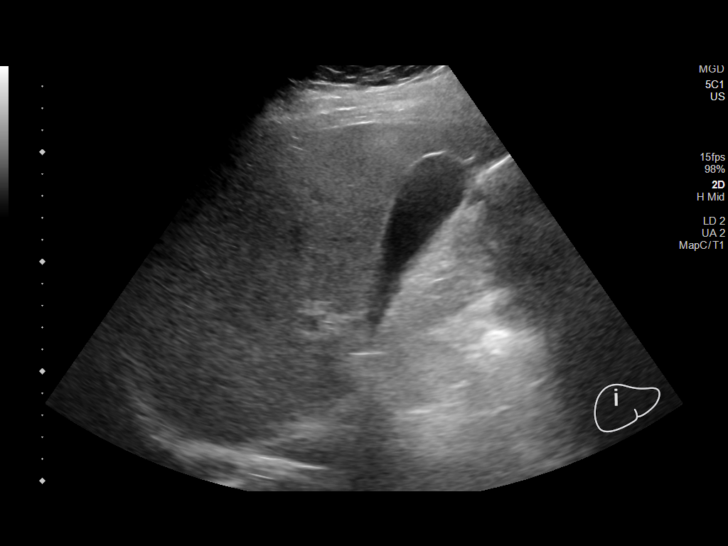
[im 33/50]
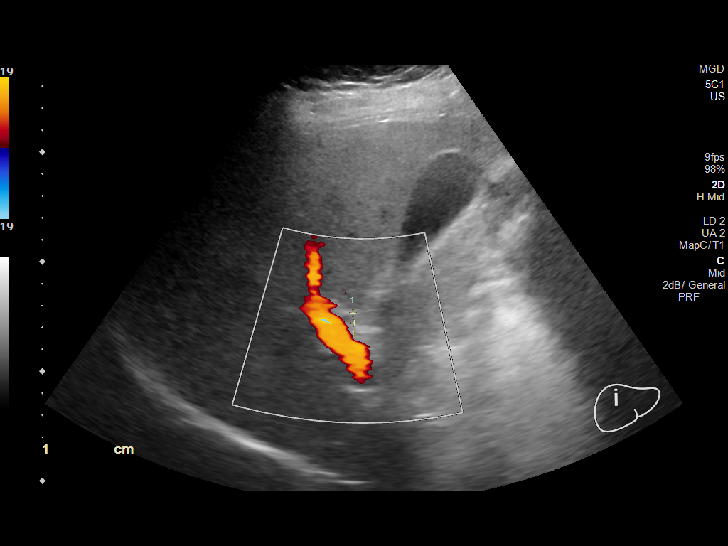
[im 37/50]
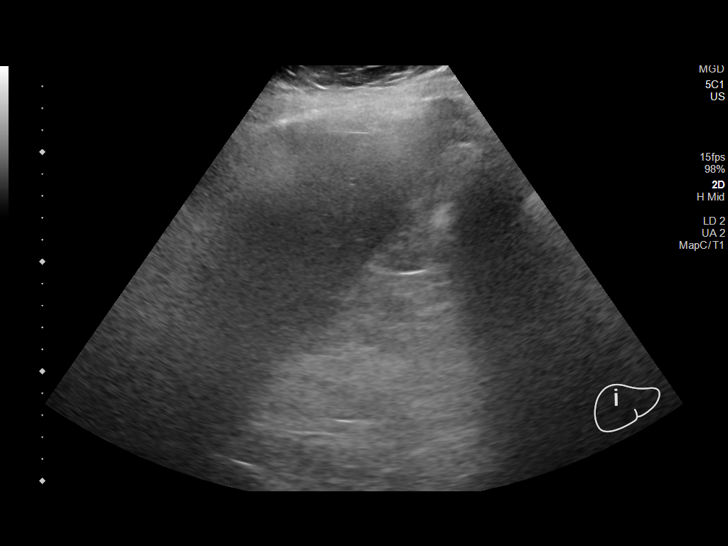
[im 41/50]
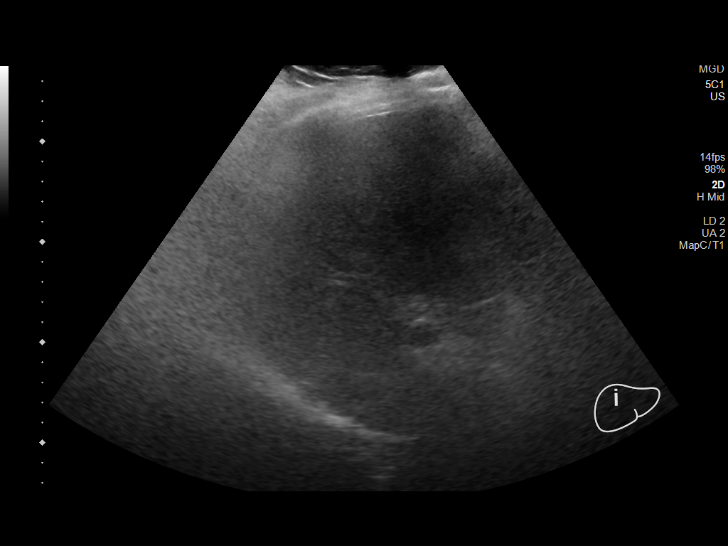
[im 45/50]
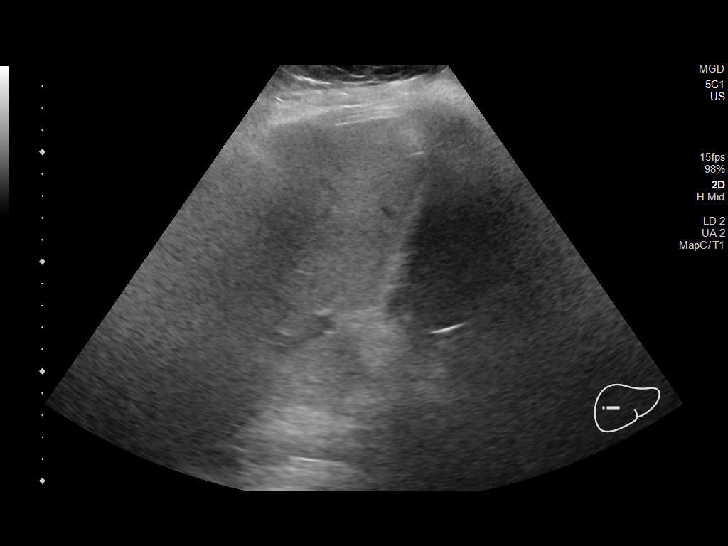
[im 50/50]
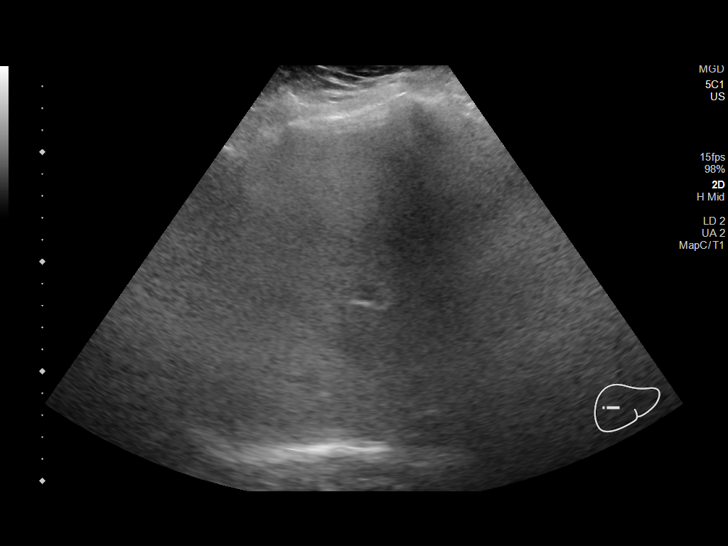

[14 of 25 positions shown; findings below may reference images not displayed]

FINDINGS: Evaluation is somewhat limited by patient body habitus and inability
to lay in the left lateral decubitus position.

Gallbladder:

No gallstones or wall thickening visualized. No sonographic Murphy
sign noted by sonographer.

Common bile duct:

Diameter: 5 mm

Liver:

No focal lesion identified. Within normal limits in parenchymal
echogenicity. Portal vein is patent on color Doppler imaging with
normal direction of blood flow towards the liver.

Other: None.
IMPRESSION: Evaluation is limited by body habitus and difficult positioning.
Within this limitation, no acute process in the right upper
quadrant. Consider CT if there is persistent concern for acute
process in the right upper quadrant.

## 2021-10-27 MED ORDER — PANTOPRAZOLE SODIUM 40 MG IV SOLR
40.0000 mg | Freq: Once | INTRAVENOUS | Status: AC
  Administered 2021-10-27: 40 mg via INTRAVENOUS
  Filled 2021-10-27: qty 10

## 2021-10-27 MED ORDER — PANTOPRAZOLE SODIUM 20 MG PO TBEC: 20.0000 mg | DELAYED_RELEASE_TABLET | Freq: Every day | ORAL | 0 refills | Status: DC

## 2021-10-27 MED ORDER — ALUM & MAG HYDROXIDE-SIMETH 200-200-20 MG/5ML PO SUSP
15.0000 mL | Freq: Once | ORAL | Status: AC
  Administered 2021-10-27: 15 mL via ORAL
  Filled 2021-10-27: qty 30

## 2021-10-27 NOTE — Discharge Instructions (Signed)
1.  Your symptoms of upper abdominal discomfort and fullness may be due to reflux.  Review the instructions for gastroesophageal reflux disease.  Try to follow dietary instructions.  Start taking Protonix daily as prescribed. ?2.  Follow-up with your doctor for recheck to see if you are improving with treatment. ?3.  Return to emergency department if you have new worsening or concerning symptoms. ?

## 2021-10-27 NOTE — ED Triage Notes (Signed)
Abdominal pain for the past 3 weeks, worse today. Has been here for the same without relief. States it is burning in his chest. ?

## 2021-10-27 NOTE — ED Provider Notes (Signed)
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 male.  HPI Patient reports he is having a lot of discomfort in his central upper abdomen and under his ribs.  He reports it feels like a fullness and pressure.  There is also some burning quality.  He feels the burning into his chest.  Patient denies a diagnosis of reflux.  He reports he occasionally takes some Tums but is not on any regular reflux treatment.  No fever no chills no cough.  No vomiting or diarrhea.    Home Medications Prior to Admission medications   Medication Sig Start Date End Date Taking? Authorizing Provider  pantoprazole (PROTONIX) 20 MG tablet Take 1 tablet (20 mg total) by mouth daily. 10/27/21  Yes Charlesetta Shanks, MD  acetaminophen (TYLENOL) 500 MG tablet Take 500-1,000 mg by mouth every 6 (six) hours as needed for mild Brewer.    [provider]  AMBULATORY NON FORMULARY MEDICATION Medication Name: [facility] Wheelchair 07/31/21   Carollee Herter, Alferd Apa, DO  amLODipine (NORVASC) 10 MG tablet Take 1 tablet (10 mg total) by mouth daily. 08/24/21   [name], DO  aspirin EC 81 MG tablet Take 1 tablet (81 mg total) by mouth daily. Swallow whole. 09/29/21 09/29/22  Swayze, Ava, DO  calcium carbonate (TUMS - DOSED IN MG ELEMENTAL CALCIUM) 500 MG chewable tablet Chew 1 tablet (200 mg of elemental calcium total) by mouth every 6 (six) hours as needed for indigestion or heartburn. 09/29/21   Swayze, Ava, DO  Cyanocobalamin (VITAMIN B-12 PO) Take 1 tablet by mouth daily.    [provider]  diclofenac Sodium (VOLTAREN) 1 % GEL Apply 4 g topically 4 (four) times daily. 10/25/21   Deno Etienne, DO  DULoxetine (CYMBALTA) 60 MG capsule Take 60 mg by mouth daily. 05/06/21   [provider]  furosemide (LASIX) 20 MG tablet Take 1 tablet (20 mg  total) by mouth 2 (two) times daily. 09/29/21   Swayze, Ava, DO  gabapentin (NEURONTIN) 300 MG capsule Take 2 capsules (600 mg total) by mouth 3 (three) times daily. 09/29/21   Swayze, Ava, DO  hydrALAZINE (APRESOLINE) 50 MG tablet TAKE 1 TABLET(50 MG) BY MOUTH EVERY 8 HOURS 08/01/21   Carollee Herter, Alferd Apa, DO  hydrOXYzine (ATARAX) 25 MG tablet Take 1 tablet (25 mg total) by mouth every 6 (six) hours as needed for anxiety. 08/09/21   [name], DO  immune globulin, human, (GAMMAGARD S/D) 5 g injection Inject into the vein every 21 ( twenty-one) days. Patient not sure of dose    [provider]  insulin aspart (NOVOLOG FLEXPEN) 100 UNIT/ML FlexPen Max daily 80 units per scale Patient taking differently: Inject 0-80 Units into the skin See admin instructions. Max daily 80 units per scale 08/24/21   [name], Melanie Crazier, MD  insulin degludec (TRESIBA FLEXTOUCH) 100 UNIT/ML FlexTouch Pen Inject 30 Units into the skin daily. 08/24/21   [name], Melanie Crazier, MD  Insulin Pen Needle 31G X 5 MM MISC 1 Device by Does not apply route in the morning, at noon, in the evening, and at bedtime. 08/24/21   [name], Melanie Crazier, MD  lidocaine (LIDODERM) 5 % Place 1 patch onto the skin daily. Remove & Discard patch within 12 hours or as directed  by MD 09/29/21   Swayze, Ava, DO  losartan (COZAAR) 100 MG tablet TAKE ONE TABLET BY MOUTH DAILY 04/18/21   Carollee Herter, Alferd Apa, DO  MAGNESIUM PO Take 1 tablet by mouth daily.    [provider]  metFORMIN (GLUCOPHAGE-XR) 500 MG 24 hr tablet TAKE 2 TABLETS IN THE      MORNING AND AT BEDTIME 09/26/21   [name], Melanie Crazier, MD  methocarbamol (ROBAXIN) 500 MG tablet Take 500 mg by mouth 4 (four) times daily as needed for muscle spasms.    [provider]  metoprolol tartrate (LOPRESSOR) 100 MG tablet Take 1 tablet (100 mg total) by mouth once for 1 dose. Take 1.5-2 hours before coronary CTA. 09/29/21 09/29/21  Sande Rives  E, PA-C  Multiple Vitamin (MULTIVITAMIN) tablet Take 1 tablet by mouth daily.    [provider]  nitroGLYCERIN (NITROSTAT) 0.4 MG SL tablet Place 1 tablet (0.4 mg total) under the tongue every 5 (five) minutes as needed for chest Brewer. 09/29/21   Swayze, Ava, DO  NONFORMULARY OR COMPOUNDED ITEM Power wheelchair 06/15/21   Carollee Herter, Alferd Apa, DO  NONFORMULARY OR COMPOUNDED [employer] wheelchair 08/04/21   Carollee Herter, Alferd Apa, DO  omeprazole (PRILOSEC OTC) 20 MG tablet Take 1 tablet (20 mg total) by mouth daily. 09/29/21 09/29/22  Swayze, Ava, DO  polyethylene glycol (MIRALAX / GLYCOLAX) 17 g packet Take 17 g by mouth daily as needed for mild constipation. 07/21/21   Davonna Belling, MD  pravastatin (PRAVACHOL) 40 MG tablet TAKE 1 TABLET DAILY 09/25/21   Carollee Herter, Alferd Apa, DO  sucralfate (CARAFATE) 1 g tablet Take 1 tablet (1 g total) by mouth 4 (four) times daily -  with meals and at bedtime for 7 days. 10/09/21 10/16/21  Jeanell Sparrow, DO  tamsulosin (FLOMAX) 0.4 MG CAPS capsule TAKE ONE CAPSULE BY MOUTH ONE TIME DAILY 04/18/21   Carollee Herter, Alferd Apa, DO  traMADol (ULTRAM) 50 MG tablet 2 po q6 h prn Patient taking differently: Take 100 mg by mouth every 6 (six) hours as needed for moderate Brewer. 09/08/21   [name], DO      Allergies    Patient has no known allergies.    Review of Systems   Review of Systems 10 Systems reviewed negative except as per HPI Physical Exam Updated Vital Signs BP 129/87    Pulse 84    Temp 98.3 F (36.8 C) (Oral)    Resp 16    Ht '5\' 11"'$  (1.803 m)    Wt (!) 142.9 kg    SpO2 97%    BMI 43.93 kg/m  Physical Exam Constitutional:      Comments: Alert nontoxic no respiratory distress at rest  Eyes:     Extraocular Movements: Extraocular movements intact.  Cardiovascular:     Rate and Rhythm: Normal rate and regular rhythm.  Pulmonary:     Effort: Pulmonary effort is normal.     Breath sounds: Normal breath sounds.  Tyler:      Comments: Abdomen soft.  Mild epigastric and upper quadrant tenderness to palpation.  Lower abdomen nontender  Musculoskeletal:     Comments: Mild swelling bilateral lower extremities.  Some extension contracture of the feet  Skin:    General: Skin is warm and dry.  Neurological:     Comments: Alert.  Speech clear.  Normal spontaneous use of upper extremities.  Psychiatric:        Mood and Affect: Mood  normal.    ED Results / Procedures / Treatments   Labs (all labs ordered are listed, but only abnormal results are displayed) Labs Reviewed  COMPREHENSIVE METABOLIC PANEL - Abnormal; Notable for the following components:      Result Value   Sodium 133 (*)    Glucose, Bld 316 (*)    Calcium 8.5 (*)    Albumin 3.3 (*)    All other components within normal limits  CBC WITH DIFFERENTIAL/PLATELET  LIPASE, BLOOD  TROPONIN I (HIGH SENSITIVITY)  TROPONIN I (HIGH SENSITIVITY)    EKG EKG Interpretation  Date/Time:  Friday October 27 2021 16:33:12 EST Ventricular Rate:  88 PR Interval:  205 QRS Duration: 96 QT Interval:  360 QTC Calculation: 436 R Axis:   2 Text Interpretation: Sinus rhythm Borderline prolonged PR interval no sig change from previous Confirmed by Charlesetta Shanks [phone]) on 10/27/2021 5:41:33 PM  Radiology US Abdomen Limited  Result Date: 10/27/2021 CLINICAL DATA:  Right upper quadrant Brewer EXAM: ULTRASOUND ABDOMEN LIMITED RIGHT UPPER QUADRANT COMPARISON:  04/20/2021. FINDINGS: Evaluation is somewhat limited by patient body habitus and inability to lay in the left lateral decubitus position. Gallbladder: No gallstones or wall thickening visualized. No sonographic Murphy sign noted by sonographer. Common bile duct: Diameter: 5 mm Liver: No focal lesion identified. Within normal limits in parenchymal echogenicity. Portal vein is patent on color Doppler imaging with normal direction of blood flow towards the liver. Other: None. IMPRESSION: Evaluation is limited by body habitus  and difficult positioning. Within this limitation, no acute process in the right upper quadrant. Consider CT if there is persistent concern for acute process in the right upper quadrant. Electronically Signed   By: Merilyn Baba M.D.   On: 10/27/2021 17:25    Procedures Procedures    Medications Ordered in ED Medications  pantoprazole (PROTONIX) injection 40 mg (40 mg Intravenous Given 10/27/21 1655)  alum & mag hydroxide-simeth (MAALOX/MYLANTA) 200-200-20 MG/5ML suspension 15 mL (15 mLs Oral Given 10/27/21 1654)    ED Course/ Medical Decision Making/ A&P                           Medical Decision Making Amount and/or Complexity of Data Reviewed Radiology: ordered.  Risk OTC drugs. Prescription drug management.   Patient has many medical comorbidities.  He presents with complaint of upper Tyler Brewer fullness and some burning in the chest.  Patient has been evaluated for chest Brewer and Tyler Brewer.  Patient has had recent CT angiogram of chest abdomen and pelvis February 9.  No acute intravascular abnormalities identified.  No other acute findings.  Echocardiogram done 2\10\2023.  Given previous work-up and description of Brewer in the upper abdomen, lower suspicion today for cardiac ischemic presentation, PE, dissection.  Will evaluate troponin and EKG.  EKG shows no change from previous.  Troponin normal limits.  Right upper quadrant ultrasound obtained to rule out gallbladder disease.  No acute findings identified although study limited by body habitus.  At this time will treat the patient with Protonix and Maalox.  Plan will be to continue Protonix for at least 2 weeks and recheck with PCP for response to treatment.  Directions included for gastroesophageal reflux and return precautions for worsening or concerning symptoms.  Patient is normotensive with normal heart rate.  Stable for continued outpatient management and diagnostic evaluation.  Patient does have multiple follow-up  appointments including PCP upcoming.  Turn precautions included in discharge instructions.  Final Clinical Impression(s) / ED Diagnoses Final diagnoses:  Brewer of upper abdomen    Rx / DC Orders ED Discharge Orders          Ordered    pantoprazole (PROTONIX) 20 MG tablet  Daily        10/27/21 1802              Charlesetta Shanks, MD 10/27/21 1815

## 2021-10-28 ENCOUNTER — Other Ambulatory Visit: Payer: Self-pay | Admitting: Family Medicine

## 2021-10-28 DIAGNOSIS — I10 Essential (primary) hypertension: Secondary | ICD-10-CM

## 2021-10-30 ENCOUNTER — Ambulatory Visit: Payer: Medicare HMO

## 2021-10-30 DIAGNOSIS — Z794 Long term (current) use of insulin: Secondary | ICD-10-CM

## 2021-10-30 DIAGNOSIS — E1159 Type 2 diabetes mellitus with other circulatory complications: Secondary | ICD-10-CM

## 2021-10-30 DIAGNOSIS — G6181 Chronic inflammatory demyelinating polyneuritis: Secondary | ICD-10-CM

## 2021-10-30 NOTE — Patient Instructions (Signed)
Visit Information ? ?Thank you for taking time to visit with me today. Please don't hesitate to contact me if I can be of assistance to you before our next scheduled telephone appointment. ? ?Following are the goals we discussed today:  ?Patient Goals/Self-Care Activities: ?Patient will self administer medications as prescribed ?Patient will attend all scheduled provider appointments ?Patient will continue to perform ADL's independently ?Patient will call provider office for new concerns or questions ?Continue to maintain fall prevention strategies ?Continue to perform exercises recommended by your home health physical therapist ?Contact RNCM if you have any questions or concerns or community resource needs ? ?Our next appointment is by telephone on 11/20/21 at 11:00 am ? ?Please call the care guide team at [phone] if you need to cancel or reschedule your appointment.  ? ?If you are experiencing a Mental Health or [place] or need someone to talk to, please call the Suicide and Crisis Lifeline: 988 ?call 1-800-273-TALK (toll free, 24 hour hotline)  ? ?Patient verbalizes understanding of instructions and care plan provided today and agrees to view in [facility]. Active MyChart status confirmed with patient.   ? ?Thea Silversmith, RN, MSN, BSN, CCM ?Care Management Coordinator ?[facility] ?[phone]  ?

## 2021-10-30 NOTE — Chronic Care Management (AMB) (Signed)
Chronic Care Management   CCM RN Visit Note  10/30/2021 Name: Tyler Brewer MRN: [id number] DOB: Aug 28, 1960  Subjective: Tyler Brewer is a 61 y.o. year old male who is a primary care patient of [name], DO. The care management team was consulted for assistance with disease management and care coordination needs.    Engaged with patient by telephone for follow up visit in response to provider referral for case management and/or care coordination services.   Consent to Services:  The patient was given information about Chronic Care Management services, agreed to services, and gave verbal consent prior to initiation of services.  Please see initial visit note for detailed documentation.   Patient agreed to services and verbal consent obtained.   Assessment: Review of patient past medical history, allergies, medications, health status, including review of consultants reports, laboratory and other test data, was performed as part of comprehensive evaluation and provision of chronic care management services.   SDOH (Social Determinants of Health) assessments and interventions performed:    CCM Care Plan  No Known Allergies  Outpatient Encounter Medications as of 10/30/2021  Medication Sig Note   acetaminophen (TYLENOL) 500 MG tablet Take 500-1,000 mg by mouth every 6 (six) hours as needed for mild pain.    amLODipine (NORVASC) 10 MG tablet Take 1 tablet (10 mg total) by mouth daily.    aspirin EC 81 MG tablet Take 1 tablet (81 mg total) by mouth daily. Swallow whole.    calcium carbonate (TUMS - DOSED IN MG ELEMENTAL CALCIUM) 500 MG chewable tablet Chew 1 tablet (200 mg of elemental calcium total) by mouth every 6 (six) hours as needed for indigestion or heartburn.    Cyanocobalamin (VITAMIN B-12 PO) Take 1 tablet by mouth daily.    diclofenac Sodium (VOLTAREN) 1 % GEL Apply 4 g topically 4 (four) times daily.    DULoxetine (CYMBALTA) 60 MG capsule Take 60 mg by mouth  daily.    furosemide (LASIX) 20 MG tablet Take 1 tablet (20 mg total) by mouth 2 (two) times daily.    gabapentin (NEURONTIN) 300 MG capsule Take 2 capsules (600 mg total) by mouth 3 (three) times daily. 10/30/2021: Reports 4 capsules three times/day. Reports increased per neurologist.   hydrALAZINE (APRESOLINE) 50 MG tablet TAKE 1 TABLET(50 MG) BY MOUTH EVERY 8 HOURS    hydrOXYzine (ATARAX) 25 MG tablet Take 1 tablet (25 mg total) by mouth every 6 (six) hours as needed for anxiety.    immune globulin, human, (GAMMAGARD S/D) 5 g injection Inject into the vein every 21 ( twenty-one) days. Patient not sure of dose 09/29/2021: Patient not sure of dose    insulin aspart (NOVOLOG FLEXPEN) 100 UNIT/ML FlexPen Max daily 80 units per scale (Patient taking differently: Inject 0-80 Units into the skin See admin instructions. Max daily 80 units per scale)    insulin degludec (TRESIBA FLEXTOUCH) 100 UNIT/ML FlexTouch Pen Inject 30 Units into the skin daily.    lidocaine (LIDODERM) 5 % Place 1 patch onto the skin daily. Remove & Discard patch within 12 hours or as directed by MD    losartan (COZAAR) 100 MG tablet TAKE ONE TABLET BY MOUTH DAILY    MAGNESIUM PO Take 1 tablet by mouth daily.    metFORMIN (GLUCOPHAGE-XR) 500 MG 24 hr tablet TAKE 2 TABLETS IN THE      MORNING AND AT BEDTIME    methocarbamol (ROBAXIN) 500 MG tablet Take 500 mg by mouth 4 (four) times daily  as needed for muscle spasms.    Multiple Vitamin (MULTIVITAMIN) tablet Take 1 tablet by mouth daily.    nitroGLYCERIN (NITROSTAT) 0.4 MG SL tablet Place 1 tablet (0.4 mg total) under the tongue every 5 (five) minutes as needed for chest pain.    omeprazole (PRILOSEC OTC) 20 MG tablet Take 1 tablet (20 mg total) by mouth daily.    pantoprazole (PROTONIX) 20 MG tablet Take 1 tablet (20 mg total) by mouth daily.    polyethylene glycol (MIRALAX / GLYCOLAX) 17 g packet Take 17 g by mouth daily as needed for mild constipation.    pravastatin (PRAVACHOL) 40  MG tablet TAKE 1 TABLET DAILY    tamsulosin (FLOMAX) 0.4 MG CAPS capsule TAKE ONE CAPSULE BY MOUTH ONE TIME DAILY    traMADol (ULTRAM) 50 MG tablet 2 po q6 h prn (Patient taking differently: Take 100 mg by mouth every 6 (six) hours as needed for moderate pain.) 10/30/2021: Reports takes one 50 mg tablet every 6-8 hours as needed   AMBULATORY NON FORMULARY MEDICATION Medication Name: Dentist    Insulin Pen Needle 31G X 5 MM MISC 1 Device by Does not apply route in the morning, at noon, in the evening, and at bedtime.    metoprolol tartrate (LOPRESSOR) 100 MG tablet Take 1 tablet (100 mg total) by mouth once for 1 dose. Take 1.5-2 hours before coronary CTA.    NONFORMULARY OR COMPOUNDED ITEM Power wheelchair    NONFORMULARY OR COMPOUNDED ITEM Manuel wheelchair    sucralfate (CARAFATE) 1 g tablet Take 1 tablet (1 g total) by mouth 4 (four) times daily -  with meals and at bedtime for 7 days.    No facility-administered encounter medications on file as of 10/30/2021.    Patient Active Problem List   Diagnosis Date Noted   Elevated troponin 09/28/2021   BPH (benign prostatic hyperplasia) 09/28/2021   Chronic diastolic CHF (congestive heart failure) ([place]) 09/28/2021   CIDP (chronic inflammatory demyelinating polyneuropathy) ([place]) 01/12/2021   Diabetes mellitus ([place]) 01/03/2021   Uncontrolled type 2 diabetes mellitus with hyperglycemia, with long-term current use of insulin ([place]) 01/03/2021   Lower extremity edema 11/28/2020   Chronic low back pain 11/28/2020   Psoriatic arthritis ([place]) 11/28/2020   Thoracic aortic aneurysm without rupture 11/28/2020   Bilateral leg weakness 11/05/2020   Acute left-sided low back pain with right-sided sciatica 07/19/2020   Hip pain, acute, left 07/05/2020   Tremor 07/05/2020   Weakness 07/05/2020   Balance problem 03/11/2020   Tinea cruris 03/11/2020   Cellulitis of left groin 03/11/2020   Acute pain of right shoulder 03/11/2020   Interstitial lung  disease ([place]) 05/02/2017   Pansinusitis 09/26/2016   Diabetic polyneuropathy associated with diabetes mellitus due to underlying condition ([place]) 05/08/2016   Methamphetamine use disorder, severe, dependence ([place]) 02/11/2016   Substance induced mood disorder ([place]) 02/11/2016   Exertional dyspnea 11/30/2015   ADD (attention deficit disorder) 09/29/2015   Binge eating 09/29/2015   Syncope 05/05/2012   Diarrhea 05/05/2012   Panic attacks 05/05/2012   OSA (obstructive sleep apnea) 05/05/2012   Essential hypertension 05/05/2012   Dyslipidemia 05/05/2012   Coronary artery disease involving native coronary artery of native heart 04/01/2012   Acute left flank pain 04/01/2012   Drug abuse and dependence ([place]) 04/01/2012   Family history of coronary artery disease 03/31/2012   Sleep apnea, on C-pap 03/31/2012   Hyperlipemia 03/30/2012   HTN (hypertension), benign 03/30/2012   Morbid obesity ([place]) 03/30/2012  DM type 2, uncontrolled, with neuropathy ([place]) 03/30/2012   Depression with suicidal ideation 03/30/2012    Conditions to be addressed/monitored:DMII and CDIP/Chronic pain, Fall prevention  Care Plan : RN Case Manager Plan of Care  Updates made by Luretha Rued, RN since 10/30/2021 12:00 AM     Problem: Chronic Disease Management Education and Care Coordination needs (DM, CDIP/chronic pain)-   Priority: High     Long-Range Goal: Development of Plan of care for Chronic Disease   Start Date: 06/29/2021  Expected End Date: 05/02/2022  Priority: High  Note:   Current Barriers: Mr. Cudd reports he continues to receive IVIG infusions at [facility] physical therapy one day a week. States he is able to stand with assistance now, but states balance is still off. He states he is unable to take a step or bend down to pick up something. But reports overall improvement since initiation of IVIG. He also reports overall general pain has improved since IVIG  treatment initiated.  Reports he is taking medications as prescribed. Mr. Farve states his only concern is non emergency transportation to provider appointments. He expresses financial concerns and does not have the money to pay for all the necessary provider appointments, adding his insurance provider will only cover emergency transportation. Mr. Knobel was provided a resource from cardiology provider office: "[employer] [facility]" on 10/26/21. RNCM confirmed he received the information and patient states he will contact. He reports he continues to live in mobile home with "friends", but states some issues with living with friends, but declines to discuss. Reports for now he is going to continue living at present location. Frequent ED visits: 10/27/21 Upper right Abd pain; 10/25/21 non specific chest pain; 10/09/21 abdominal pain; 10/02/21 precordial chest pain; 09/28/21 observation for acute left flank pain-elevated troponin, but serial cardiac enzyme with flat trajectory of elevation; 08/30/21 left flank pain. Care Coordination needs related to Transportation Chronic Disease Management support and education needs related to DMII and CIDP/Chronic pain  Lacks caregiver support  RNCM Clinical Goal(s):  Patient will verbalize understanding of plan for management of DMII and CIDP/Chronic pain attend all scheduled medical appointments: 06/30/21 with primary care provider. work with Education officer, museum to address  related to the management of Level of care concerns related to the management of DMII and CIDP/Chronic pain will demonstrate ongoing self health care management ability    through collaboration with RN Care manager, provider, and care team.  Patient will verbalize understanding of plan for management of DMII and CIDP/Chronic pain to assist in maintenance and/or improvement of quality of life.  Interventions: 1:1 collaboration with primary care provider regarding development and update of comprehensive plan of  care as evidenced by provider attestation and co-signature Inter-disciplinary care team collaboration (see longitudinal plan of care) Evaluation of current treatment plan related to  self management and patient's adherence to plan as established by provider  Upper Abdominal pain Interventions: (Status:  New goal.)  Short Term Goal Frequent ED visits regarding abdominal/flank pain. Last ED visit on 10/27/21. Prescribed Protonix Advised patient to take medications as prescribed Encouraged to attend provider visit as scheduled. Patient has an appointment with PCP tomorrow. Confirmed he has transportation to appointment tomorrow.   CIPD/Chronic Pain Interventions: Status: Goal on track:  Yes. Long Term Reports decreased need for pain medications due to improvement in pain since beginning treatment with IVIG.  Reviewed medications with patient and discussed importance of taking as prescribed Reviewed scheduled/upcoming provider appointments including  at scheduled appointment tomorrow. Re-referral to care guide regarding transportation/financial concerns Encouraged to continue to attend provider visits as scheduled Discussed plans with patient for ongoing care management follow up and provided patient with direct contact information for care management team  Diabetes Interventions: Status: Goal on track: Yes. Long Term Patient reports working with Endocrinologist to improve blood sugar control-medications adjusted Assessed patient's understanding of A1c goal: <7% Reviewed medications with patient and discussed importance of medication adherence Encouraged to continue to check blood sugar as recommended Encouraged to follow up with provider visit as recommended Discussed importance of controlling blood sugars in management of overall health. Reiterated last two Hgb A1C results Re-referral to care guide re: transportation/financial concerns. Lab Results  Component Value Date   HGBA1C 8.5 (H)  09/29/2021   Lab Results  Component Value Date   HGBA1C 7.5 05/18/2021  Patient Goals/Self-Care Activities: Patient will self administer medications as prescribed Patient will attend all scheduled provider appointments Patient will continue to perform ADL's independently Patient will call provider office for new concerns or questions Continue to maintain fall prevention strategies Continue to perform exercises recommended by your home health physical therapist Contact RNCM if you have any questions or concerns or community resource needs    Plan:Telephone follow up appointment with care management team member scheduled for:  11/20/21 The patient has been provided with contact information for the care management team and has been advised to call with any health related questions or concerns.   Thea Silversmith, RN, MSN, BSN, CCM Care Management Coordinator [facility] [phone]

## 2021-10-30 NOTE — Addendum Note (Signed)
Addended by: Luretha Rued on: 10/30/2021 04:56 PM ? ? Modules accepted: Orders ? ?

## 2021-10-31 ENCOUNTER — Ambulatory Visit (INDEPENDENT_AMBULATORY_CARE_PROVIDER_SITE_OTHER): Payer: Medicare HMO | Admitting: Family Medicine

## 2021-10-31 ENCOUNTER — Telehealth (HOSPITAL_COMMUNITY): Payer: Self-pay | Admitting: *Deleted

## 2021-10-31 ENCOUNTER — Encounter (HOSPITAL_COMMUNITY): Payer: Self-pay

## 2021-10-31 ENCOUNTER — Encounter: Payer: Self-pay | Admitting: Family Medicine

## 2021-10-31 ENCOUNTER — Telehealth: Payer: Self-pay

## 2021-10-31 ENCOUNTER — Other Ambulatory Visit: Payer: Self-pay | Admitting: Family Medicine

## 2021-10-31 VITALS — BP 128/88 | HR 99 | Temp 98.7°F | Resp 20 | Ht 71.0 in

## 2021-10-31 DIAGNOSIS — E1136 Type 2 diabetes mellitus with diabetic cataract: Secondary | ICD-10-CM | POA: Diagnosis not present

## 2021-10-31 DIAGNOSIS — E1121 Type 2 diabetes mellitus with diabetic nephropathy: Secondary | ICD-10-CM | POA: Diagnosis not present

## 2021-10-31 DIAGNOSIS — G4733 Obstructive sleep apnea (adult) (pediatric): Secondary | ICD-10-CM | POA: Diagnosis not present

## 2021-10-31 DIAGNOSIS — Z87891 Personal history of nicotine dependence: Secondary | ICD-10-CM | POA: Diagnosis not present

## 2021-10-31 DIAGNOSIS — E1142 Type 2 diabetes mellitus with diabetic polyneuropathy: Secondary | ICD-10-CM | POA: Diagnosis not present

## 2021-10-31 DIAGNOSIS — I10 Essential (primary) hypertension: Secondary | ICD-10-CM

## 2021-10-31 DIAGNOSIS — E7800 Pure hypercholesterolemia, unspecified: Secondary | ICD-10-CM | POA: Diagnosis not present

## 2021-10-31 DIAGNOSIS — M5441 Lumbago with sciatica, right side: Secondary | ICD-10-CM | POA: Diagnosis not present

## 2021-10-31 DIAGNOSIS — F988 Other specified behavioral and emotional disorders with onset usually occurring in childhood and adolescence: Secondary | ICD-10-CM | POA: Diagnosis not present

## 2021-10-31 DIAGNOSIS — Z6841 Body Mass Index (BMI) 40.0 and over, adult: Secondary | ICD-10-CM | POA: Diagnosis not present

## 2021-10-31 DIAGNOSIS — F418 Other specified anxiety disorders: Secondary | ICD-10-CM | POA: Diagnosis not present

## 2021-10-31 DIAGNOSIS — Z794 Long term (current) use of insulin: Secondary | ICD-10-CM | POA: Diagnosis not present

## 2021-10-31 DIAGNOSIS — I2510 Atherosclerotic heart disease of native coronary artery without angina pectoris: Secondary | ICD-10-CM | POA: Diagnosis not present

## 2021-10-31 DIAGNOSIS — F32A Depression, unspecified: Secondary | ICD-10-CM | POA: Diagnosis not present

## 2021-10-31 DIAGNOSIS — R69 Illness, unspecified: Secondary | ICD-10-CM | POA: Diagnosis not present

## 2021-10-31 DIAGNOSIS — R1013 Epigastric pain: Secondary | ICD-10-CM

## 2021-10-31 DIAGNOSIS — N401 Enlarged prostate with lower urinary tract symptoms: Secondary | ICD-10-CM | POA: Diagnosis not present

## 2021-10-31 DIAGNOSIS — J849 Interstitial pulmonary disease, unspecified: Secondary | ICD-10-CM | POA: Diagnosis not present

## 2021-10-31 DIAGNOSIS — Z7984 Long term (current) use of oral hypoglycemic drugs: Secondary | ICD-10-CM | POA: Diagnosis not present

## 2021-10-31 DIAGNOSIS — M1990 Unspecified osteoarthritis, unspecified site: Secondary | ICD-10-CM | POA: Diagnosis not present

## 2021-10-31 DIAGNOSIS — N39498 Other specified urinary incontinence: Secondary | ICD-10-CM | POA: Diagnosis not present

## 2021-10-31 DIAGNOSIS — F410 Panic disorder [episodic paroxysmal anxiety] without agoraphobia: Secondary | ICD-10-CM | POA: Diagnosis not present

## 2021-10-31 DIAGNOSIS — K589 Irritable bowel syndrome without diarrhea: Secondary | ICD-10-CM | POA: Diagnosis not present

## 2021-10-31 DIAGNOSIS — G6181 Chronic inflammatory demyelinating polyneuritis: Secondary | ICD-10-CM | POA: Diagnosis not present

## 2021-10-31 MED ORDER — AMLODIPINE BESYLATE 10 MG PO TABS: 10.0000 mg | ORAL_TABLET | Freq: Every day | ORAL | 1 refills | Status: AC

## 2021-10-31 MED ORDER — PANTOPRAZOLE SODIUM 20 MG PO TBEC: 20.0000 mg | DELAYED_RELEASE_TABLET | Freq: Every day | ORAL | 0 refills | Status: DC

## 2021-10-31 MED ORDER — HYDROXYZINE HCL 25 MG PO TABS: 25.0000 mg | ORAL_TABLET | Freq: Four times a day (QID) | ORAL | 1 refills | Status: AC | PRN

## 2021-10-31 NOTE — Telephone Encounter (Signed)
? ?  Telephone encounter was:  Successful.  ?10/31/2021 ?Name: Karson Reede MRN: [id number] DOB: Apr 27, 1961 ? ?Givanni Staron is a 61 y.o. year old male who is a primary care patient of [name], DO . The community resource team was consulted for assistance with Transportation Needs  ? ?Care guide performed the following interventions: Patient provided with information about care guide support team and interviewed to confirm resource needs. Pt is in need of transportation for dr appointments I will email some resources as well as follow up about [facility] transportation ? ?Follow Up Plan:  Care guide will follow up with patient by phone over the next day ? ? ? ?Larena Sox ?Care Guide, Embedded Care Coordination ?[facility], Care Management  ?[phone] ?[address], [place], [place] [address] ?Phone: [phone] ?Email: [email or web address] ? ?  ?

## 2021-10-31 NOTE — Patient Instructions (Signed)
Food Choices for Gastroesophageal Reflux Disease, Adult °When you have gastroesophageal reflux disease (GERD), the foods you eat and your eating habits are very important. Choosing the right foods can help ease the discomfort of GERD. Consider working with a dietitian to help you make healthy food choices. °What are tips for following this plan? °Reading food labels °Look for foods that are low in saturated fat. Foods that have less than 5% of daily value (DV) of fat and 0 g of trans fats may help with your symptoms. °Cooking °Cook foods using methods other than frying. This may include baking, steaming, grilling, or broiling. These are all methods that do not need a lot of fat for cooking. °To add flavor, try to use herbs that are low in spice and acidity. °Meal planning ° °Choose healthy foods that are low in fat, such as fruits, vegetables, whole grains, low-fat dairy products, lean meats, fish, and poultry. °Eat frequent, small meals instead of three large meals each day. Eat your meals slowly, in a relaxed setting. Avoid bending over or lying down until 2-3 hours after eating. °Limit high-fat foods such as fatty meats or fried foods. °Limit your intake of fatty foods, such as oils, butter, and shortening. °Avoid the following as told by your health care provider: °Foods that cause symptoms. These may be different for different people. Keep a food diary to keep track of foods that cause symptoms. °Alcohol. °Drinking large amounts of liquid with meals. °Eating meals during the 2-3 hours before bed. °Lifestyle °Maintain a healthy weight. Ask your health care provider what weight is healthy for you. If you need to lose weight, work with your health care provider to do so safely. °Exercise for at least 30 minutes on 5 or more days each week, or as told by your health care provider. °Avoid wearing clothes that fit tightly around your waist and chest. °Do not use any products that contain nicotine or tobacco. These  products include cigarettes, chewing tobacco, and vaping devices, such as e-cigarettes. If you need help quitting, ask your health care provider. °Sleep with the head of your bed raised. Use a wedge under the mattress or blocks under the bed frame to raise the head of the bed. °Chew sugar-free gum after mealtimes. °What foods should I eat? °Eat a healthy, well-balanced diet of fruits, vegetables, whole grains, low-fat dairy products, lean meats, fish, and poultry. Each person is different. Foods that may trigger symptoms in one person may not trigger any symptoms in another person. Work with your health care provider to identify foods that are safe for you. °The items listed above may not be a complete list of recommended foods and beverages. Contact a dietitian for more information. °What foods should I avoid? °Limiting some of these foods may help manage the symptoms of GERD. Everyone is different. Consult a dietitian or your health care provider to help you identify the exact foods to avoid, if any. °Fruits °Any fruits prepared with added fat. Any fruits that cause symptoms. For some people this may include citrus fruits, such as oranges, grapefruit, pineapple, and lemons. °Vegetables °Deep-fried vegetables. French fries. Any vegetables prepared with added fat. Any vegetables that cause symptoms. For some people, this may include tomatoes and tomato products, chili peppers, onions and garlic, and horseradish. °Grains °Pastries or quick breads with added fat. °Meats and other proteins °High-fat meats, such as fatty beef or pork, hot dogs, ribs, ham, sausage, salami, and bacon. Fried meat or protein, including fried   fish and fried chicken. Nuts and nut butters, in large amounts. °Dairy °Whole milk and chocolate milk. Sour cream. Cream. Ice cream. Cream cheese. Milkshakes. °Fats and oils °Butter. Margarine. Shortening. Ghee. °Beverages °Coffee and tea, with or without caffeine. Carbonated beverages. Sodas. Energy  drinks. Fruit juice made with acidic fruits, such as orange or grapefruit. Tomato juice. Alcoholic drinks. °Sweets and desserts °Chocolate and cocoa. Donuts. °Seasonings and condiments °Pepper. Peppermint and spearmint. Added salt. Any condiments, herbs, or seasonings that cause symptoms. For some people, this may include curry, hot sauce, or vinegar-based salad dressings. °The items listed above may not be a complete list of foods and beverages to avoid. Contact a dietitian for more information. °Questions to ask your health care provider °Diet and lifestyle changes are usually the first steps that are taken to manage symptoms of GERD. If diet and lifestyle changes do not improve your symptoms, talk with your health care provider about taking medicines. °Where to find more information °International Foundation for Gastrointestinal Disorders: aboutgerd.org °Summary °When you have gastroesophageal reflux disease (GERD), food and lifestyle choices may be very helpful in easing the discomfort of GERD. °Eat frequent, small meals instead of three large meals each day. Eat your meals slowly, in a relaxed setting. Avoid bending over or lying down until 2-3 hours after eating. °Limit high-fat foods such as fatty meats or fried foods. °This information is not intended to replace advice given to you by your health care provider. Make sure you discuss any questions you have with your health care provider. °Document Revised: 02/15/2020 Document Reviewed: 02/15/2020 °Elsevier Patient Education © 2022 [employer] Inc. ° °

## 2021-10-31 NOTE — Telephone Encounter (Signed)
Reaching out to patient to offer assistance regarding upcoming cardiac imaging study; pt verbalizes understanding of appt date/time, but wishes to move appointment due to transportation issues.  New appointment made for March 27 at 2pm. He states he still has the pill for the test and that he is a difficult IV start. Will send new CT instructions to his MyChart per his request. ? ?Gordy Clement RN Navigator Cardiac Imaging ?[facility] ?[phone] office ?[phone] cell ? ?

## 2021-11-01 ENCOUNTER — Ambulatory Visit (HOSPITAL_COMMUNITY): Admission: RE | Admit: 2021-11-01 | Payer: Medicare HMO | Source: Ambulatory Visit

## 2021-11-01 ENCOUNTER — Telehealth: Payer: Self-pay

## 2021-11-01 DIAGNOSIS — F418 Other specified anxiety disorders: Secondary | ICD-10-CM | POA: Insufficient documentation

## 2021-11-01 DIAGNOSIS — E1165 Type 2 diabetes mellitus with hyperglycemia: Secondary | ICD-10-CM | POA: Diagnosis not present

## 2021-11-01 DIAGNOSIS — R1013 Epigastric pain: Secondary | ICD-10-CM | POA: Insufficient documentation

## 2021-11-01 DIAGNOSIS — I10 Essential (primary) hypertension: Secondary | ICD-10-CM | POA: Insufficient documentation

## 2021-11-01 NOTE — Assessment & Plan Note (Signed)
Well controlled, no changes to meds. Encouraged heart healthy diet such as the DASH diet and exercise as tolerated.  °

## 2021-11-01 NOTE — Assessment & Plan Note (Signed)
Start protonix ?Gi referral pending  ?

## 2021-11-01 NOTE — Telephone Encounter (Signed)
? ?  Telephone encounter was:  Unsuccessful.  11/01/2021 ?Name: Merritt Mccravy MRN: [id number] DOB: 05-May-1961 ? ?Unsuccessful outbound call made today to assist with:  Transportation Needs  ?Follow up call for transportation needs confirmation of email sent ? ? ? ? ?Larena Sox ?Care Guide, Embedded Care Coordination ?, Care Management  ?[phone] ?[address], [place], [place] [address] ?Phone: [phone] ?Email: [email or web address] ? ?  ?

## 2021-11-01 NOTE — Progress Notes (Signed)
? ?Established Patient Office Visit ? ?Subjective:  ?Patient ID: Tyler Brewer, male    DOB: 1960-08-26  Age: 61 y.o. MRN: [id number] ? ?CC:  ?Chief Complaint  ?Patient presents with  ? Abdominal Pain  ?  Pt states having pain x1 month. Pt states having upper left quadrant pain and tenderness. No vomiting or diarrhea   ? ? ?HPI ?Tyler Brewer presents for abd pain x 1 month..  he has been in the er 3-4 times --- [place], ct done --  neg ?Carafate helped but he did not pick up protonix--- he said he had trouble with ins / PA  ? ?Past Medical History:  ?Diagnosis Date  ? Arthritis   ? CAD (coronary artery disease)  cardiac cath with moderate disease in a septal branch of the ramus intermedius 04/01/2012  ? CIDP (chronic inflammatory demyelinating polyneuropathy) (HCC)   ? Depression   ? Diabetes mellitus   ? poorly controlled by his report  ? History of narcotic addiction ([place])   ? past history of back pain  ? Hypercholesteremia   ? Hypertension   ? IBS (irritable bowel syndrome)   ? 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 Neuropathy   ? Obesity   ? Max weight was 390  ? OSA on CPAP   ? Panic attacks   ? Testosterone deficiency   ? Vertigo   ? ? ?Past Surgical History:  ?Procedure Laterality Date  ? CARDIAC CATHETERIZATION    ? IR FLUORO GUIDE CV LINE RIGHT  11/10/2020  ? IR US GUIDE VASC ACCESS RIGHT  11/10/2020  ? LEFT HEART CATHETERIZATION WITH CORONARY ANGIOGRAM N/A 03/31/2012  ? Procedure: LEFT HEART CATHETERIZATION WITH CORONARY ANGIOGRAM;  Surgeon: Leonie Man, MD;  Location: [facility] CATH LAB;  Service: Cardiovascular;  Laterality: N/A;  ? ? ?Family History  ?Problem Relation Age of Onset  ? Diabetes type II Father   ? Hypertension Father   ? Pancreatic disease Father 53  ?     Deceased  ? Healthy Mother   ? Healthy Sister   ? Healthy Son   ? Healthy Daughter   ? Parkinson's disease Maternal Grandmother   ? ? ?Social History  ? ?Socioeconomic History  ? Marital status: Single  ?  Spouse name: Not on file  ? Number of  children: 2  ? Years of education: Not on file  ? Highest education level: Not on file  ?Occupational History  ? Not on file  ?Tobacco Use  ? Smoking status: Former  ?  Packs/day: 0.50  ?  Types: Cigarettes  ?  Quit date: 04/27/2021  ?  Years since quitting: 0.5  ? Smokeless tobacco: Never  ?Vaping Use  ? Vaping Use: Never used  ?Substance and Sexual Activity  ? Alcohol use: No  ? Drug use: Not Currently  ?  Types: Methamphetamines  ? Sexual activity: Yes  ?Other Topics Concern  ? Not on file  ?Social History Narrative  ?   Has 2 grown children.    ? Works as a Barrister's clerk.  Education: Oceanographer.  ? Right Handed  ? Drinks Caffeine   ? Mother recently moved and sold her house-- pt is living with a friend in a trailer   ? ?Social Determinants of Health  ? ?Financial Resource Strain: Low Risk   ? Difficulty of Paying Living Expenses: Not very hard  ?Food Insecurity: No Food Insecurity  ? Worried About Charity fundraiser in the Last Year: Never true  ?  Ran Out of Food in the Last Year: Never true  ?Transportation Needs: Unmet Transportation Needs  ? Lack of Transportation (Medical): Yes  ? Lack of Transportation (Non-Medical): Yes  ?Physical Activity: Insufficiently Active  ? Days of Exercise per Week: 2 days  ? Minutes of Exercise per Session: 30 min  ?Stress: Not on file  ?Social Connections: Not on file  ?Intimate Partner Violence: Not on file  ? ? ?Outpatient Medications Prior to Visit  ?Medication Sig Dispense Refill  ? acetaminophen (TYLENOL) 500 MG tablet Take 500-1,000 mg by mouth every 6 (six) hours as needed for mild pain.    ? AMBULATORY NON FORMULARY MEDICATION Medication Name: Audelia Hives Wheelchair 1 Device 0  ? aspirin EC 81 MG tablet Take 1 tablet (81 mg total) by mouth daily. Swallow whole. 150 tablet 2  ? calcium carbonate (TUMS - DOSED IN MG ELEMENTAL CALCIUM) 500 MG chewable tablet Chew 1 tablet (200 mg of elemental calcium total) by mouth every 6 (six) hours as needed for indigestion or heartburn. 30  tablet 0  ? Cyanocobalamin (VITAMIN B-12 PO) Take 1 tablet by mouth daily.    ? diclofenac Sodium (VOLTAREN) 1 % GEL Apply 4 g topically 4 (four) times daily. 100 g 0  ? DULoxetine (CYMBALTA) 60 MG capsule Take 60 mg by mouth daily.    ? furosemide (LASIX) 20 MG tablet Take 1 tablet (20 mg total) by mouth 2 (two) times daily. 60 tablet 0  ? gabapentin (NEURONTIN) 300 MG capsule Take 2 capsules (600 mg total) by mouth 3 (three) times daily. 180 capsule 0  ? hydrALAZINE (APRESOLINE) 50 MG tablet TAKE 1 TABLET(50 MG) BY MOUTH EVERY 8 HOURS 270 tablet 0  ? immune globulin, human, (GAMMAGARD S/D) 5 g injection Inject into the vein every 21 ( twenty-one) days. Patient not sure of dose    ? insulin aspart (NOVOLOG FLEXPEN) 100 UNIT/ML FlexPen Max daily 80 units per scale (Patient taking differently: Inject 0-80 Units into the skin See admin instructions. Max daily 80 units per scale) 45 mL 6  ? insulin degludec (TRESIBA FLEXTOUCH) 100 UNIT/ML FlexTouch Pen Inject 30 Units into the skin daily. 30 mL 3  ? Insulin Pen Needle 31G X 5 MM MISC 1 Device by Does not apply route in the morning, at noon, in the evening, and at bedtime. 400 each 3  ? lidocaine (LIDODERM) 5 % Place 1 patch onto the skin daily. Remove & Discard patch within 12 hours or as directed by MD 30 patch 0  ? losartan (COZAAR) 100 MG tablet TAKE ONE TABLET BY MOUTH DAILY 90 tablet 1  ? MAGNESIUM PO Take 1 tablet by mouth daily.    ? metFORMIN (GLUCOPHAGE-XR) 500 MG 24 hr tablet TAKE 2 TABLETS IN THE      MORNING AND AT BEDTIME 360 tablet 1  ? methocarbamol (ROBAXIN) 500 MG tablet Take 500 mg by mouth 4 (four) times daily as needed for muscle spasms.    ? Multiple Vitamin (MULTIVITAMIN) tablet Take 1 tablet by mouth daily.    ? nitroGLYCERIN (NITROSTAT) 0.4 MG SL tablet Place 1 tablet (0.4 mg total) under the tongue every 5 (five) minutes as needed for chest pain. 14 tablet 0  ? NONFORMULARY OR COMPOUNDED ITEM Power wheelchair 1 each 0  ? NONFORMULARY OR  COMPOUNDED ITEM Manuel wheelchair 1 each 0  ? omeprazole (PRILOSEC OTC) 20 MG tablet Take 1 tablet (20 mg total) by mouth daily. 28 tablet 1  ? polyethylene glycol (  MIRALAX / GLYCOLAX) 17 g packet Take 17 g by mouth daily as needed for mild constipation. 14 each 0  ? pravastatin (PRAVACHOL) 40 MG tablet TAKE 1 TABLET DAILY 90 tablet 1  ? tamsulosin (FLOMAX) 0.4 MG CAPS capsule TAKE ONE CAPSULE BY MOUTH ONE TIME DAILY 90 capsule 1  ? traMADol (ULTRAM) 50 MG tablet 2 po q6 h prn (Patient taking differently: Take 100 mg by mouth every 6 (six) hours as needed for moderate pain.) 120 tablet 1  ? amLODipine (NORVASC) 10 MG tablet Take 1 tablet (10 mg total) by mouth daily. 90 tablet 1  ? hydrOXYzine (ATARAX) 25 MG tablet Take 1 tablet (25 mg total) by mouth every 6 (six) hours as needed for anxiety. 30 tablet 0  ? pantoprazole (PROTONIX) 20 MG tablet Take 1 tablet (20 mg total) by mouth daily. 30 tablet 0  ? metoprolol tartrate (LOPRESSOR) 100 MG tablet Take 1 tablet (100 mg total) by mouth once for 1 dose. Take 1.5-2 hours before coronary CTA. 1 tablet 0  ? sucralfate (CARAFATE) 1 g tablet Take 1 tablet (1 g total) by mouth 4 (four) times daily -  with meals and at bedtime for 7 days. 28 tablet 0  ? ?No facility-administered medications prior to visit.  ? ? ?No Known Allergies ? ?ROS ?Review of Systems  ?Constitutional:  Negative for chills and fever.  ?HENT:  Negative for congestion, postnasal drip, rhinorrhea and sinus pressure.   ?Respiratory:  Negative for cough, chest tightness, shortness of breath and wheezing.   ?Cardiovascular:  Negative for chest pain, palpitations and leg swelling.  ?Gastrointestinal:  Positive for abdominal pain. Negative for blood in stool, constipation, diarrhea and nausea.  ?Allergic/Immunologic: Negative for environmental allergies.  ? ?  ?Objective:  ?  ?Physical Exam ?Vitals and nursing note reviewed.  ?Pulmonary:  ?   Effort: Pulmonary effort is normal.  ?   Breath sounds: Normal breath  sounds.  ?Abdominal:  ?   General: Bowel sounds are normal. There is no distension.  ?   Palpations: Abdomen is soft.  ?   Tenderness: There is abdominal tenderness in the epigastric area. There is no guarding or r

## 2021-11-02 ENCOUNTER — Telehealth: Payer: Self-pay

## 2021-11-02 NOTE — Telephone Encounter (Signed)
? ?  Telephone encounter was:  Successful.  ?11/02/2021 ?Name: Tyler Brewer MRN: [id number] DOB: May 31, 1961 ? ?Tyler Brewer is a 61 y.o. year old male who is a primary care patient of [name], DO . The community resource team was consulted for assistance with Transportation Needs  ? ?Care guide performed the following interventions: Patient provided with information about care guide support team and interviewed to confirm resource needs. ? ?Follow Up Plan:  Care guide will follow up with patient by phone over the next two weeks ? ? ? ?Larena Sox ?Care Guide, Embedded Care Coordination ?[facility], Care Management  ?[id number] ?[address], [place], [place] [address] ?Phone: [phone] ?Email: [email or web address] ? ?  ?

## 2021-11-04 NOTE — Progress Notes (Signed)
?Cardiology Office Note:   ? ?Date:  11/15/2021  ? ?ID:  Tyler Brewer, DOB Mar 10, 1961, MRN [id number] ? ?PCP:  [name], DO  ?Cardiologist:  Kirk Ruths, MD  ?Electrophysiologist:  None  ? ?Referring MD: Carollee Herter, Alferd Apa, *  ? ?Chief Complaint: hospital follow-up of atypical chest pain ? ?History of Present Illness:   ? ?Tyler Brewer is a 61 y.o. male with a history of moderate luminal irregularities but no obstructive CAD noted on cardiac catheterization in 03/2012, hypertension, hyperlipidemia, type 2 diabetes on insulin, obstructive sleep apnea on CPAP, IBS, chronic inflammatory demyelinating polyneuropathy, chronic back pain, prior methamphetamine abuse, prior tobacco use (quit around August 2022), and anxiety who is followed by Dr. Stanford Breed and presents today for hospital follow-up of chest pain.  ? ?Remote cardiac catheterization in 2013 showed mild to moderate luminal irregularities but no significant disease. Last ischemic evaluation was a Myoview in 11/2015 was low risk with no evidence of ischemia. Patient was recently admitted from 09/28/2021 to 09/29/2021 for evaluation of chest pain and shortness of breath. Patient has a lot of chronic pain issues due to chronic back pain and CIDP. However, he presented with a different type of back pain that radiated to the front of his chest and was quite severe. Pain was worse when taking a deep breath and reproducible with palpation of back and chest. He also has chronic dyspnea with exertion and reported this was a little worse than usual. EKG showed no acute ischemic changes. High-sensitivity troponin was mildly elevated and flat peaking at 139 not consistent with ACS. BNP normal. CTA was negative for aortic dissection.  Echo showed LVEF of 55-60% with normal wall motion and borderline dilatation of the ascending aorta measuring 38 mm. Symptoms were felt to be very atypical. However, outpatient coronary CTA was ordered due to multiple CV risk  factors and showed coronary calcium score of 1290 (98th percentile for age and sex) with no more than mild coronary stenosis of major coronary arteries; however, there is significant smaller branch vessel disease with suspected severe diffuse disease in a small caliber ramus, moderate stenosis in the small D1, and possible moderate stenosis in the PLOM.  Study was unable to be analyzed by FFR. ? ?Since then, he has had 3 separate ED visit for abdominal pain and atypical chest pain most recently on 10/27/2021. Work-up at each visit including imaging, EKG, and labs (troponin, lipase, LFTs) has been unremarkable. He was seen by his PCP on 10/31/2021 and referred to GI.  ? ?Patient presents today for follow-up.  Patient is doing okay today.  He states he has good days and bad days.  He thinks most of his problems are due to CIDP.  He describes very atypical chest pain that he describes as lower sternum pain that goes around his rib cage and is tender to palpation. He he also describes occasional very brief sharp pain that lasts only a couple seconds at a time.  He has not had any more of the chest pressure that brought him to the hospital in 09/2021. We reviewed his coronary CTA results - do not think symptoms are cardiac chest pain. He has chronic dyspnea on exertion but this is stable.  No orthopnea or PND. He has chronic lower extremity edema. He states his left leg is usually bigger than his right leg but today his right leg is clearly bigger than his left leg. Right leg is also painful to palpation today. He denies  any significant palpitations. He notes some intermittent lightheadedness/dizziness if he stands to quickly of with ambulating for long but this does not sound new. No syncope. He also notes some anxiety. ? ?Past Medical History:  ?Diagnosis Date  ? Arthritis   ? CAD (coronary artery disease)  cardiac cath with moderate disease in a septal branch of the ramus intermedius 04/01/2012  ? CIDP (chronic  inflammatory demyelinating polyneuropathy) (HCC)   ? Depression   ? Diabetes mellitus   ? poorly controlled by his report  ? History of narcotic addiction ([place])   ? past history of back pain  ? Hypercholesteremia   ? Hypertension   ? IBS (irritable bowel syndrome)   ? 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 Neuropathy   ? Obesity   ? Max weight was 390  ? OSA on CPAP   ? Panic attacks   ? Testosterone deficiency   ? Vertigo   ? ? ?Past Surgical History:  ?Procedure Laterality Date  ? CARDIAC CATHETERIZATION    ? IR FLUORO GUIDE CV LINE RIGHT  11/10/2020  ? IR US GUIDE VASC ACCESS RIGHT  11/10/2020  ? LEFT HEART CATHETERIZATION WITH CORONARY ANGIOGRAM N/A 03/31/2012  ? Procedure: LEFT HEART CATHETERIZATION WITH CORONARY ANGIOGRAM;  Surgeon: Leonie Man, MD;  Location: [facility] CATH LAB;  Service: Cardiovascular;  Laterality: N/A;  ? ? ?Current Medications: ?Current Meds  ?Medication Sig  ? acetaminophen (TYLENOL) 500 MG tablet Take 500-1,000 mg by mouth every 6 (six) hours as needed for mild pain.  ? AMBULATORY NON FORMULARY MEDICATION Medication Name: Audelia Hives Wheelchair  ? amLODipine (NORVASC) 10 MG tablet Take 1 tablet (10 mg total) by mouth daily.  ? aspirin EC 81 MG tablet Take 1 tablet (81 mg total) by mouth daily. Swallow whole.  ? calcium carbonate (TUMS - DOSED IN MG ELEMENTAL CALCIUM) 500 MG chewable tablet Chew 1 tablet (200 mg of elemental calcium total) by mouth every 6 (six) hours as needed for indigestion or heartburn.  ? Cyanocobalamin (VITAMIN B-12 PO) Take 1 tablet by mouth daily.  ? diclofenac Sodium (VOLTAREN) 1 % GEL Apply 4 g topically 4 (four) times daily.  ? DULoxetine (CYMBALTA) 60 MG capsule Take 60 mg by mouth daily.  ? furosemide (LASIX) 20 MG tablet Take 1 tablet (20 mg total) by mouth 2 (two) times daily.  ? gabapentin (NEURONTIN) 300 MG capsule Take 2 capsules (600 mg total) by mouth 3 (three) times daily. (Patient taking differently: Take 1,200 mg by mouth 3 (three) times daily.)  ? hydrALAZINE  (APRESOLINE) 50 MG tablet TAKE 1 TABLET(50 MG) BY MOUTH EVERY 8 HOURS  ? hydrOXYzine (ATARAX) 25 MG tablet Take 1 tablet (25 mg total) by mouth every 6 (six) hours as needed for anxiety. (Patient taking differently: Take 25 mg by mouth at bedtime.)  ? immune globulin, human, (GAMMAGARD S/D) 5 g injection Inject into the vein every 21 ( twenty-one) days. Patient not sure of dose  ? insulin aspart (NOVOLOG FLEXPEN) 100 UNIT/ML FlexPen Max daily 80 units per scale (Patient taking differently: Inject 0-80 Units into the skin See admin instructions. Max daily 80 units per scale)  ? insulin degludec (TRESIBA FLEXTOUCH) 100 UNIT/ML FlexTouch Pen Inject 30 Units into the skin daily.  ? Insulin Pen Needle 31G X 5 MM MISC 1 Device by Does not apply route in the morning, at noon, in the evening, and at bedtime.  ? lidocaine (LIDODERM) 5 % Place 1 patch onto the skin daily. Remove &  Discard patch within 12 hours or as directed by MD  ? losartan (COZAAR) 100 MG tablet TAKE ONE TABLET BY MOUTH DAILY  ? MAGNESIUM PO Take 1 tablet by mouth daily.  ? metFORMIN (GLUCOPHAGE-XR) 500 MG 24 hr tablet TAKE 2 TABLETS IN THE      MORNING AND AT BEDTIME (Patient taking differently: Take 1,000 mg by mouth in the morning and at bedtime.)  ? methocarbamol (ROBAXIN) 500 MG tablet Take 500 mg by mouth 4 (four) times daily as needed for muscle spasms.  ? Multiple Vitamin (MULTIVITAMIN) tablet Take 1 tablet by mouth daily.  ? nitroGLYCERIN (NITROSTAT) 0.4 MG SL tablet Place 1 tablet (0.4 mg total) under the tongue every 5 (five) minutes as needed for chest pain.  ? NONFORMULARY OR COMPOUNDED ITEM Power wheelchair  ? NONFORMULARY OR COMPOUNDED ITEM Manuel wheelchair  ? omeprazole (PRILOSEC OTC) 20 MG tablet Take 1 tablet (20 mg total) by mouth daily.  ? pantoprazole (PROTONIX) 20 MG tablet Take 1 tablet (20 mg total) by mouth daily.  ? polyethylene glycol (MIRALAX / GLYCOLAX) 17 g packet Take 17 g by mouth daily as needed for mild constipation.  ?  pravastatin (PRAVACHOL) 40 MG tablet TAKE 1 TABLET DAILY  ? tamsulosin (FLOMAX) 0.4 MG CAPS capsule TAKE ONE CAPSULE BY MOUTH ONE TIME DAILY  ? traMADol (ULTRAM) 50 MG tablet 2 po q6 h prn (Patient taking differe

## 2021-11-07 DIAGNOSIS — G6181 Chronic inflammatory demyelinating polyneuritis: Secondary | ICD-10-CM | POA: Diagnosis not present

## 2021-11-08 DIAGNOSIS — G6181 Chronic inflammatory demyelinating polyneuritis: Secondary | ICD-10-CM | POA: Diagnosis not present

## 2021-11-09 ENCOUNTER — Telehealth (HOSPITAL_COMMUNITY): Payer: Self-pay | Admitting: *Deleted

## 2021-11-09 NOTE — Telephone Encounter (Signed)
Reaching to make sure patient had no questions regarding his cardiac CT scan instructions. Patient states that he has not read them in detail but would do so and call back with questions.  He is aware to arrive at 1:30pm for his 2pm test. ? ?Gordy Clement RN Navigator Cardiac Imaging ?[facility] ?[phone] Office ?[phone] Cell ? ?

## 2021-11-10 ENCOUNTER — Ambulatory Visit: Payer: Medicare HMO | Admitting: Gastroenterology

## 2021-11-13 ENCOUNTER — Ambulatory Visit (HOSPITAL_COMMUNITY)
Admission: RE | Admit: 2021-11-13 | Discharge: 2021-11-13 | Disposition: A | Discharge status: Home / Self Care | Payer: Medicare HMO | Source: Ambulatory Visit | Attending: Student | Admitting: Student

## 2021-11-13 ENCOUNTER — Other Ambulatory Visit: Payer: Self-pay

## 2021-11-13 DIAGNOSIS — R9431 Abnormal electrocardiogram [ECG] [EKG]: Secondary | ICD-10-CM | POA: Insufficient documentation

## 2021-11-13 DIAGNOSIS — R931 Abnormal findings on diagnostic imaging of heart and coronary circulation: Secondary | ICD-10-CM | POA: Insufficient documentation

## 2021-11-13 DIAGNOSIS — I7121 Aneurysm of the ascending aorta, without rupture: Secondary | ICD-10-CM | POA: Diagnosis not present

## 2021-11-13 DIAGNOSIS — R079 Chest pain, unspecified: Secondary | ICD-10-CM | POA: Diagnosis not present

## 2021-11-13 DIAGNOSIS — I2510 Atherosclerotic heart disease of native coronary artery without angina pectoris: Secondary | ICD-10-CM | POA: Insufficient documentation

## 2021-11-13 DIAGNOSIS — I77810 Thoracic aortic ectasia: Secondary | ICD-10-CM | POA: Insufficient documentation

## 2021-11-13 IMAGING — CT CT HEART MORP W/ CTA COR W/ SCORE W/ CA W/CM &/OR W/O CM
2 of 8 series · 6 of 20 positions shown, 7 images · non-contrast
Comparison: CT a of the chest on [DATE]
COMPARISON: CT a of the chest on [DATE]

Addendum:
EXAM:
OVER-READ INTERPRETATION  CT CHEST

The following report is an over-read performed by radiologist Dr.
ZHANAIDAR [REDACTED] on [DATE]. This
over-read does not include interpretation of cardiac or coronary
anatomy or pathology. The coronary CTA interpretation by the
cardiologist is attached.
CLINICAL DATA: Chest pain
Cardiac CTA
MEDICATIONS:
Sub lingual nitro. 4mg x 2
TECHNIQUE: The patient was scanned on a Siemens [REDACTED]ice scanner. Gantry
rotation speed was 250 msecs. Collimation was 0.6 mm. A 100 kV
prospective scan was triggered in the ascending thoracic aorta at
35-75% of the R-R interval. Average HR during the scan was 60 bpm.
The 3D data set was interpreted on a dedicated work station using
MPR, MIP and VRT modes. A total of 80cc of contrast was used.

[Series 6: ts syst sharp · axial · 0.38mm/px · z∈[+1309,+1387]mm · 3 of 392 slices shown]
[im 98/392  lung]
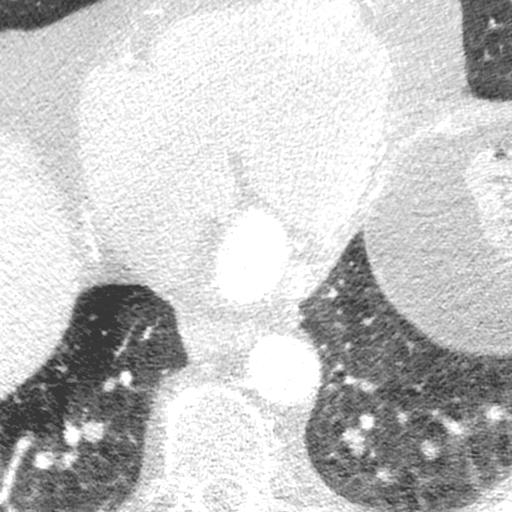
[im 196/392  lung]
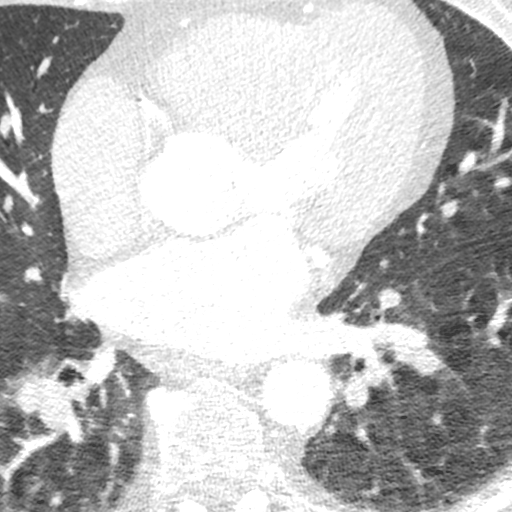
[im 294/392  lung]
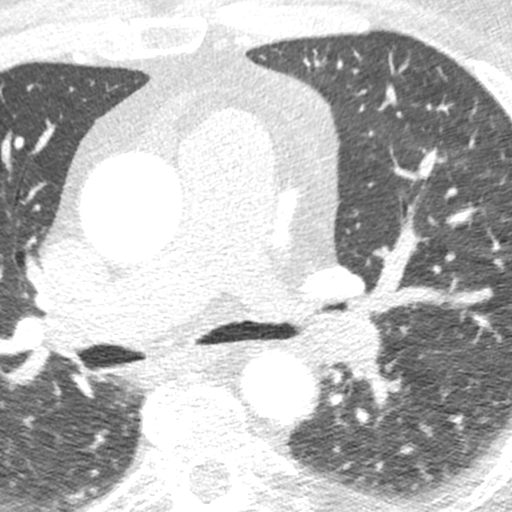

[Series 11: best syst · axial · 0.42mm/px · z∈[+1282,+1389]mm · 3 of 268 slices shown, 4 images]
[im 1/268  vessel]
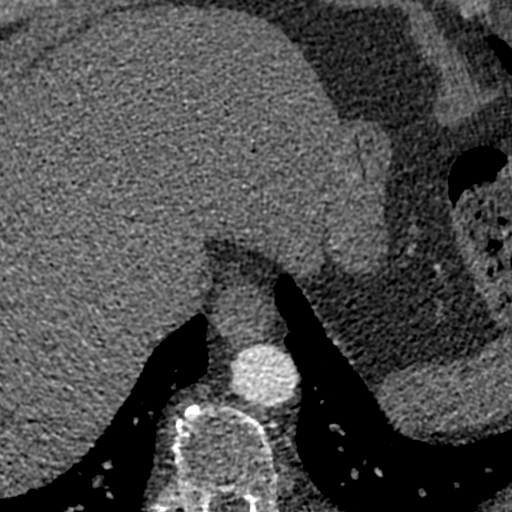
[im 1/268  lung]
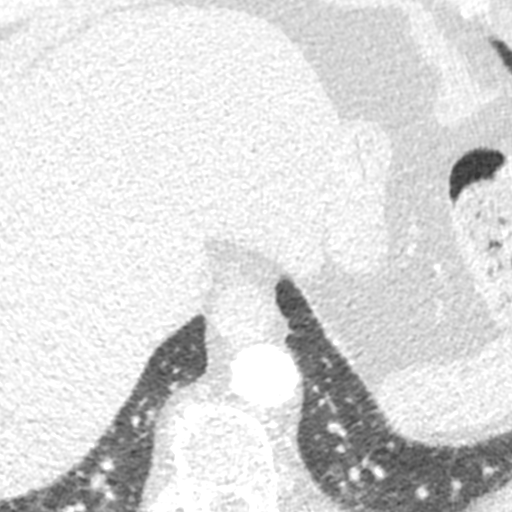
[im 134/268  vessel]
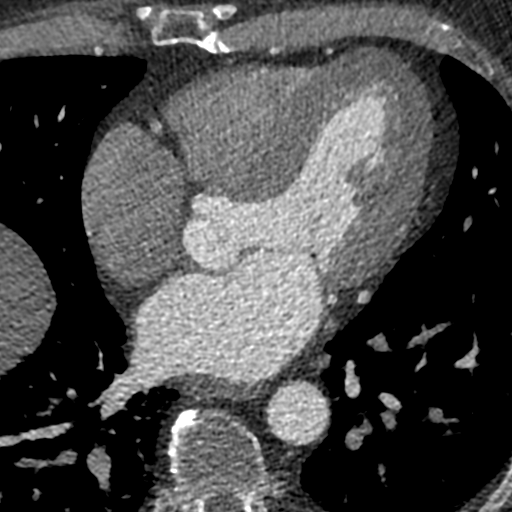
[im 268/268  vessel]
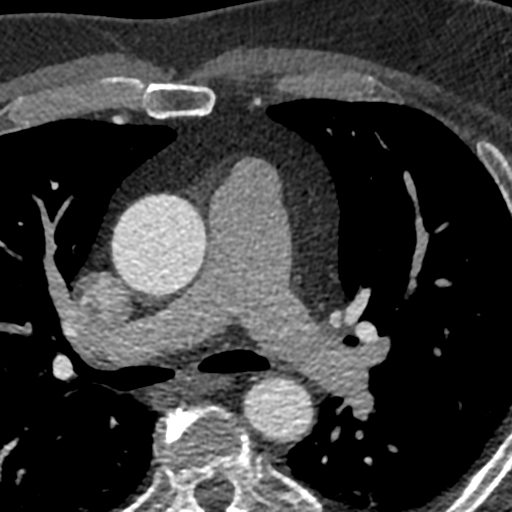

[6 of 20 positions shown; findings below may reference images not displayed]

FINDINGS: Vascular: Top-normal/mildly dilated ascending thoracic aorta
measuring up to approximately 4 cm in maximum caliber.

Mediastinum/Nodes: Visualized mediastinum and hilar regions
demonstrate no lymphadenopathy or masses.

Lungs/Pleura: Visualized lungs show no evidence of pulmonary edema,
consolidation, pneumothorax, nodule or pleural fluid.

Upper Abdomen: No acute abnormality.

Musculoskeletal: No chest wall mass or suspicious bone lesions
identified.
IMPRESSION: Mildly dilated ascending thoracic aorta measuring up to
approximately 4 cm in maximum diameter. Recommend annual imaging
followup by CTA or MRA. This recommendation follows [LY]
ACCF/AHA/AATS/ACR/ASA/SCA/ZHANAIDAR/ZHANAIDAR/ZHANAIDAR/ZHANAIDAR Guidelines for the
Diagnosis and Management of Patients with Thoracic Aortic Disease.
Circulation. [LY]; 121: [id number]. Aortic aneurysm NOS ([LY]-[LY])
FINDINGS: Non-cardiac: See separate report from [REDACTED].

Ascending aorta mildly dilated at 4.1 cm. Pulmonary veins drain
normally to the left atrium. No LA appendage thrombus.

Calcium Score: [LY] Agatston units.

Coronary Arteries: Right dominant with no anomalies

LM: Mixed plaque left main with minimal stenosis.

LAD system: Spotty calcified plaque throughout the LAD, no more than
mild stenosis (25-49% at most). Small caliber D1 with diffuse
disease, up to moderate stenosis (50-69%).

Circumflex system: Small caliber ramus appears to have severe
diffuse disease throughout (70-99%). Spotty calcifed plaque
throughout the proximal to mid LCx with no more than mild (1-24%)
stenosis. Large PLOM with calcified plaque and possible moderate
stenosis (50-69%) in the mid vessel.

RCA system: Calcified plaque proximal and mid RCA with mild (1-24%)
stenosis.
IMPRESSION: 1. Coronary artery calcium score [LY] Agatston units places the
patient in the 98th percentile for age and gender, suggesting high
risk for future cardiac events.

2. The major coronaries appear to have no more than mild coronary
stenosis. However, there is significant smaller branch vessel
disease. Suspect severe diffuse disease in a small caliber ramus,
moderate stenosis in a small D1, and possible moderate stenosis in a
PLOM.

Study was unable to be analyzed by FFR.

ZHANAIDAR

*** End of Addendum ***
EXAM:
OVER-READ INTERPRETATION  CT CHEST

The following report is an over-read performed by radiologist Dr.
ZHANAIDAR [REDACTED] on [DATE]. This
over-read does not include interpretation of cardiac or coronary
anatomy or pathology. The coronary CTA interpretation by the
cardiologist is attached.
FINDINGS: Vascular: Top-normal/mildly dilated ascending thoracic aorta
measuring up to approximately 4 cm in maximum caliber.

Mediastinum/Nodes: Visualized mediastinum and hilar regions
demonstrate no lymphadenopathy or masses.

Lungs/Pleura: Visualized lungs show no evidence of pulmonary edema,
consolidation, pneumothorax, nodule or pleural fluid.

Upper Abdomen: No acute abnormality.

Musculoskeletal: No chest wall mass or suspicious bone lesions
identified.
IMPRESSION: Mildly dilated ascending thoracic aorta measuring up to
approximately 4 cm in maximum diameter. Recommend annual imaging
followup by CTA or MRA. This recommendation follows [LY]
ACCF/AHA/AATS/ACR/ASA/SCA/ZHANAIDAR/ZHANAIDAR/ZHANAIDAR/ZHANAIDAR Guidelines for the
Diagnosis and Management of Patients with Thoracic Aortic Disease.
Circulation. [LY]; 121: [id number]. Aortic aneurysm NOS ([LY]-[LY])

## 2021-11-13 MED ORDER — METOPROLOL TARTRATE 5 MG/5ML IV SOLN
INTRAVENOUS | Status: AC
  Filled 2021-11-13: qty 10

## 2021-11-13 MED ORDER — IOHEXOL 350 MG/ML SOLN
100.0000 mL | Freq: Once | INTRAVENOUS | Status: AC | PRN
  Administered 2021-11-13: 100 mL via INTRAVENOUS

## 2021-11-13 MED ORDER — METOPROLOL TARTRATE 5 MG/5ML IV SOLN: 5.0000 mg | INTRAVENOUS | Status: DC | PRN

## 2021-11-13 MED ORDER — NITROGLYCERIN 0.4 MG SL SUBL
SUBLINGUAL_TABLET | SUBLINGUAL | Status: AC
  Filled 2021-11-13: qty 2

## 2021-11-13 MED ORDER — NITROGLYCERIN 0.4 MG SL SUBL
0.8000 mg | SUBLINGUAL_TABLET | Freq: Once | SUBLINGUAL | Status: AC
  Administered 2021-11-13: 0.8 mg via SUBLINGUAL

## 2021-11-14 ENCOUNTER — Encounter (HOSPITAL_COMMUNITY): Payer: Medicare HMO

## 2021-11-15 ENCOUNTER — Encounter: Payer: Self-pay | Admitting: Student

## 2021-11-15 ENCOUNTER — Other Ambulatory Visit (HOSPITAL_COMMUNITY): Payer: Self-pay | Admitting: Student

## 2021-11-15 ENCOUNTER — Ambulatory Visit (INDEPENDENT_AMBULATORY_CARE_PROVIDER_SITE_OTHER): Payer: Medicare HMO | Admitting: Student

## 2021-11-15 ENCOUNTER — Ambulatory Visit (HOSPITAL_COMMUNITY)
Admission: RE | Admit: 2021-11-15 | Discharge: 2021-11-15 | Disposition: A | Discharge status: Home / Self Care | Payer: Medicare HMO | Source: Ambulatory Visit | Attending: Cardiology | Admitting: Cardiology

## 2021-11-15 VITALS — BP 98/60 | HR 58 | Ht 71.0 in | Wt 327.0 lb

## 2021-11-15 DIAGNOSIS — Z794 Long term (current) use of insulin: Secondary | ICD-10-CM

## 2021-11-15 DIAGNOSIS — E785 Hyperlipidemia, unspecified: Secondary | ICD-10-CM

## 2021-11-15 DIAGNOSIS — R0609 Other forms of dyspnea: Secondary | ICD-10-CM | POA: Diagnosis not present

## 2021-11-15 DIAGNOSIS — M79669 Pain in unspecified lower leg: Secondary | ICD-10-CM

## 2021-11-15 DIAGNOSIS — R600 Localized edema: Secondary | ICD-10-CM

## 2021-11-15 DIAGNOSIS — E118 Type 2 diabetes mellitus with unspecified complications: Secondary | ICD-10-CM

## 2021-11-15 DIAGNOSIS — R0789 Other chest pain: Secondary | ICD-10-CM

## 2021-11-15 DIAGNOSIS — I10 Essential (primary) hypertension: Secondary | ICD-10-CM | POA: Diagnosis not present

## 2021-11-15 DIAGNOSIS — I2510 Atherosclerotic heart disease of native coronary artery without angina pectoris: Secondary | ICD-10-CM

## 2021-11-15 NOTE — Patient Instructions (Signed)
Medication Instructions:  ? ? For the next 3 days( March 30,31,and November 18, 2021 )  take 40 mg Furosemide ( Lasix) in the morning  and 20 mg in the evening. ?Then return to 20 mg twice day  of Furosemide ( Lasix)  ? ? ? ?*If you need a refill on your cardiac medications before your next appointment, please call your pharmacy* ? ? ?Lab Work: today  ?BNP ?BMP ?If you have labs (blood work) drawn today and your tests are completely normal, you will receive your results only by: ?MyChart Message (if you have MyChart) OR ?A paper copy in the mail ?If you have any lab test that is abnormal or we need to change your treatment, we will call you to review the results. ? ? ?Testing/Procedures: today - bilateral  venous doppler  ? Your physician has requested that you have a lower extremity venous duplex. This test is an ultrasound of the veins in the legs. It looks at venous blood flow that carries blood from the heart to the legs. Allow one hour for a Lower Venous exam. There are no restrictions or special instructions. ? ? ? ? ?Follow-Up: ?At [facility], you and your health needs are our priority.  As part of our continuing mission to provide you with exceptional heart care, we have created designated Provider Care Teams.  These Care Teams include your primary Cardiologist (physician) and Advanced Practice Providers (APPs -  Physician Assistants and Nurse Practitioners) who all work together to provide you with the care you need, when you need it. ? ?  ? ?Your next appointment:   ?3 month(s) ? ?The format for your next appointment:   ?In Person ? ?Provider:   ?Kirk Ruths, MD ? ? ?Other Instructions  ? ?For next 2 weeks please record your blood pressure reading daily  ( day , time  )  contact office withreading by phone or mychart  ?

## 2021-11-16 DIAGNOSIS — R600 Localized edema: Secondary | ICD-10-CM | POA: Diagnosis not present

## 2021-11-16 DIAGNOSIS — R0609 Other forms of dyspnea: Secondary | ICD-10-CM | POA: Diagnosis not present

## 2021-11-16 DIAGNOSIS — R079 Chest pain, unspecified: Secondary | ICD-10-CM | POA: Diagnosis not present

## 2021-11-16 DIAGNOSIS — I2510 Atherosclerotic heart disease of native coronary artery without angina pectoris: Secondary | ICD-10-CM | POA: Diagnosis not present

## 2021-11-17 DIAGNOSIS — I10 Essential (primary) hypertension: Secondary | ICD-10-CM | POA: Diagnosis not present

## 2021-11-17 DIAGNOSIS — Z87891 Personal history of nicotine dependence: Secondary | ICD-10-CM | POA: Diagnosis not present

## 2021-11-17 DIAGNOSIS — I2510 Atherosclerotic heart disease of native coronary artery without angina pectoris: Secondary | ICD-10-CM | POA: Diagnosis not present

## 2021-11-17 DIAGNOSIS — E1159 Type 2 diabetes mellitus with other circulatory complications: Secondary | ICD-10-CM | POA: Diagnosis not present

## 2021-11-17 DIAGNOSIS — M5441 Lumbago with sciatica, right side: Secondary | ICD-10-CM | POA: Diagnosis not present

## 2021-11-17 DIAGNOSIS — E7800 Pure hypercholesterolemia, unspecified: Secondary | ICD-10-CM | POA: Diagnosis not present

## 2021-11-17 DIAGNOSIS — Z6841 Body Mass Index (BMI) 40.0 and over, adult: Secondary | ICD-10-CM | POA: Diagnosis not present

## 2021-11-17 DIAGNOSIS — F988 Other specified behavioral and emotional disorders with onset usually occurring in childhood and adolescence: Secondary | ICD-10-CM | POA: Diagnosis not present

## 2021-11-17 DIAGNOSIS — Z7984 Long term (current) use of oral hypoglycemic drugs: Secondary | ICD-10-CM | POA: Diagnosis not present

## 2021-11-17 DIAGNOSIS — I119 Hypertensive heart disease without heart failure: Secondary | ICD-10-CM | POA: Diagnosis not present

## 2021-11-17 DIAGNOSIS — K589 Irritable bowel syndrome without diarrhea: Secondary | ICD-10-CM | POA: Diagnosis not present

## 2021-11-17 DIAGNOSIS — N401 Enlarged prostate with lower urinary tract symptoms: Secondary | ICD-10-CM | POA: Diagnosis not present

## 2021-11-17 DIAGNOSIS — F410 Panic disorder [episodic paroxysmal anxiety] without agoraphobia: Secondary | ICD-10-CM | POA: Diagnosis not present

## 2021-11-17 DIAGNOSIS — E785 Hyperlipidemia, unspecified: Secondary | ICD-10-CM

## 2021-11-17 DIAGNOSIS — Z794 Long term (current) use of insulin: Secondary | ICD-10-CM

## 2021-11-17 DIAGNOSIS — E1121 Type 2 diabetes mellitus with diabetic nephropathy: Secondary | ICD-10-CM | POA: Diagnosis not present

## 2021-11-17 DIAGNOSIS — G6181 Chronic inflammatory demyelinating polyneuritis: Secondary | ICD-10-CM | POA: Diagnosis not present

## 2021-11-17 DIAGNOSIS — G4733 Obstructive sleep apnea (adult) (pediatric): Secondary | ICD-10-CM | POA: Diagnosis not present

## 2021-11-17 DIAGNOSIS — E1142 Type 2 diabetes mellitus with diabetic polyneuropathy: Secondary | ICD-10-CM | POA: Diagnosis not present

## 2021-11-17 DIAGNOSIS — J849 Interstitial pulmonary disease, unspecified: Secondary | ICD-10-CM | POA: Diagnosis not present

## 2021-11-17 DIAGNOSIS — N39498 Other specified urinary incontinence: Secondary | ICD-10-CM | POA: Diagnosis not present

## 2021-11-17 DIAGNOSIS — F32A Depression, unspecified: Secondary | ICD-10-CM | POA: Diagnosis not present

## 2021-11-17 DIAGNOSIS — M1990 Unspecified osteoarthritis, unspecified site: Secondary | ICD-10-CM | POA: Diagnosis not present

## 2021-11-17 DIAGNOSIS — E1136 Type 2 diabetes mellitus with diabetic cataract: Secondary | ICD-10-CM | POA: Diagnosis not present

## 2021-11-17 LAB — BASIC METABOLIC PANEL
BUN/Creatinine Ratio: 23 — ABNORMAL HIGH (ref 9–20)
BUN: 22 mg/dL (ref 6–24)
CO2: 22 mmol/L (ref 20–29)
Calcium: 9.5 mg/dL (ref 8.7–10.2)
Chloride: 98 mmol/L (ref 96–106)
Creatinine, Ser: 0.96 mg/dL (ref 0.76–1.27)
Glucose: 399 mg/dL — ABNORMAL HIGH (ref 70–99)
Potassium: 5 mmol/L (ref 3.5–5.2)
Sodium: 134 mmol/L (ref 134–144)
eGFR: 92 mL/min/{1.73_m2} (ref >59)

## 2021-11-17 LAB — BRAIN NATRIURETIC PEPTIDE: BNP: 15 pg/mL (ref 0.0–100.0)

## 2021-11-20 ENCOUNTER — Telehealth: Payer: Self-pay

## 2021-11-20 ENCOUNTER — Telehealth: Payer: Medicare HMO

## 2021-11-20 NOTE — Telephone Encounter (Signed)
Caller/Agency: [facility] ?Callback Number: [phone] ?Requesting OT/PT/Skilled Nursing/Social Work/Speech Therapy: Re-certification on PT ?Frequency: 1x week for 1 week, 2x for 4 weeks, 1x week for 4 weeks ?

## 2021-11-20 NOTE — Telephone Encounter (Signed)
Verbal given 

## 2021-11-20 NOTE — Telephone Encounter (Signed)
?  Care Management  ? ?Follow Up Note ? ? ?11/20/2021 ?Name: Lowen Barringer MRN: [id number] DOB: 26-Apr-1961 ? ? ?Referred by: [name], DO ?Reason for referral : No chief complaint on file. ? ? ?An unsuccessful telephone outreach was attempted today. The patient was referred to the case management team for assistance with care management and care coordination.  A HIPPA compliant phone message was left for the patient providing contact information and requesting a return call.  ? ?Follow up Plans: The Care Management Team will reach out to patient again over the next 30 days. ? ?Thea Silversmith, RN, MSN, BSN, CCM ?Care Management Coordinator ?[facility] ?[phone]  ?

## 2021-11-23 DIAGNOSIS — E7800 Pure hypercholesterolemia, unspecified: Secondary | ICD-10-CM | POA: Diagnosis not present

## 2021-11-23 DIAGNOSIS — F410 Panic disorder [episodic paroxysmal anxiety] without agoraphobia: Secondary | ICD-10-CM | POA: Diagnosis not present

## 2021-11-23 DIAGNOSIS — M5441 Lumbago with sciatica, right side: Secondary | ICD-10-CM | POA: Diagnosis not present

## 2021-11-23 DIAGNOSIS — M1990 Unspecified osteoarthritis, unspecified site: Secondary | ICD-10-CM | POA: Diagnosis not present

## 2021-11-23 DIAGNOSIS — I2510 Atherosclerotic heart disease of native coronary artery without angina pectoris: Secondary | ICD-10-CM | POA: Diagnosis not present

## 2021-11-23 DIAGNOSIS — I10 Essential (primary) hypertension: Secondary | ICD-10-CM | POA: Diagnosis not present

## 2021-11-23 DIAGNOSIS — J849 Interstitial pulmonary disease, unspecified: Secondary | ICD-10-CM | POA: Diagnosis not present

## 2021-11-23 DIAGNOSIS — Z794 Long term (current) use of insulin: Secondary | ICD-10-CM | POA: Diagnosis not present

## 2021-11-23 DIAGNOSIS — E1136 Type 2 diabetes mellitus with diabetic cataract: Secondary | ICD-10-CM | POA: Diagnosis not present

## 2021-11-23 DIAGNOSIS — Z7984 Long term (current) use of oral hypoglycemic drugs: Secondary | ICD-10-CM | POA: Diagnosis not present

## 2021-11-23 DIAGNOSIS — F32A Depression, unspecified: Secondary | ICD-10-CM | POA: Diagnosis not present

## 2021-11-23 DIAGNOSIS — E1142 Type 2 diabetes mellitus with diabetic polyneuropathy: Secondary | ICD-10-CM | POA: Diagnosis not present

## 2021-11-23 DIAGNOSIS — K589 Irritable bowel syndrome without diarrhea: Secondary | ICD-10-CM | POA: Diagnosis not present

## 2021-11-23 DIAGNOSIS — N401 Enlarged prostate with lower urinary tract symptoms: Secondary | ICD-10-CM | POA: Diagnosis not present

## 2021-11-23 DIAGNOSIS — G4733 Obstructive sleep apnea (adult) (pediatric): Secondary | ICD-10-CM | POA: Diagnosis not present

## 2021-11-23 DIAGNOSIS — E1121 Type 2 diabetes mellitus with diabetic nephropathy: Secondary | ICD-10-CM | POA: Diagnosis not present

## 2021-11-23 DIAGNOSIS — Z6841 Body Mass Index (BMI) 40.0 and over, adult: Secondary | ICD-10-CM | POA: Diagnosis not present

## 2021-11-23 DIAGNOSIS — N39498 Other specified urinary incontinence: Secondary | ICD-10-CM | POA: Diagnosis not present

## 2021-11-23 DIAGNOSIS — F988 Other specified behavioral and emotional disorders with onset usually occurring in childhood and adolescence: Secondary | ICD-10-CM | POA: Diagnosis not present

## 2021-11-23 DIAGNOSIS — G6181 Chronic inflammatory demyelinating polyneuritis: Secondary | ICD-10-CM | POA: Diagnosis not present

## 2021-11-23 DIAGNOSIS — Z87891 Personal history of nicotine dependence: Secondary | ICD-10-CM | POA: Diagnosis not present

## 2021-11-27 DIAGNOSIS — I10 Essential (primary) hypertension: Secondary | ICD-10-CM | POA: Diagnosis not present

## 2021-11-27 DIAGNOSIS — I2510 Atherosclerotic heart disease of native coronary artery without angina pectoris: Secondary | ICD-10-CM | POA: Diagnosis not present

## 2021-11-27 DIAGNOSIS — F988 Other specified behavioral and emotional disorders with onset usually occurring in childhood and adolescence: Secondary | ICD-10-CM | POA: Diagnosis not present

## 2021-11-27 DIAGNOSIS — N401 Enlarged prostate with lower urinary tract symptoms: Secondary | ICD-10-CM | POA: Diagnosis not present

## 2021-11-27 DIAGNOSIS — Z6841 Body Mass Index (BMI) 40.0 and over, adult: Secondary | ICD-10-CM | POA: Diagnosis not present

## 2021-11-27 DIAGNOSIS — M1990 Unspecified osteoarthritis, unspecified site: Secondary | ICD-10-CM | POA: Diagnosis not present

## 2021-11-27 DIAGNOSIS — Z87891 Personal history of nicotine dependence: Secondary | ICD-10-CM | POA: Diagnosis not present

## 2021-11-27 DIAGNOSIS — N39498 Other specified urinary incontinence: Secondary | ICD-10-CM | POA: Diagnosis not present

## 2021-11-27 DIAGNOSIS — M5441 Lumbago with sciatica, right side: Secondary | ICD-10-CM | POA: Diagnosis not present

## 2021-11-27 DIAGNOSIS — E1121 Type 2 diabetes mellitus with diabetic nephropathy: Secondary | ICD-10-CM | POA: Diagnosis not present

## 2021-11-27 DIAGNOSIS — E7800 Pure hypercholesterolemia, unspecified: Secondary | ICD-10-CM | POA: Diagnosis not present

## 2021-11-27 DIAGNOSIS — J849 Interstitial pulmonary disease, unspecified: Secondary | ICD-10-CM | POA: Diagnosis not present

## 2021-11-27 DIAGNOSIS — E1142 Type 2 diabetes mellitus with diabetic polyneuropathy: Secondary | ICD-10-CM | POA: Diagnosis not present

## 2021-11-27 DIAGNOSIS — Z7984 Long term (current) use of oral hypoglycemic drugs: Secondary | ICD-10-CM | POA: Diagnosis not present

## 2021-11-27 DIAGNOSIS — G4733 Obstructive sleep apnea (adult) (pediatric): Secondary | ICD-10-CM | POA: Diagnosis not present

## 2021-11-27 DIAGNOSIS — F410 Panic disorder [episodic paroxysmal anxiety] without agoraphobia: Secondary | ICD-10-CM | POA: Diagnosis not present

## 2021-11-27 DIAGNOSIS — K589 Irritable bowel syndrome without diarrhea: Secondary | ICD-10-CM | POA: Diagnosis not present

## 2021-11-27 DIAGNOSIS — E1136 Type 2 diabetes mellitus with diabetic cataract: Secondary | ICD-10-CM | POA: Diagnosis not present

## 2021-11-27 DIAGNOSIS — Z794 Long term (current) use of insulin: Secondary | ICD-10-CM | POA: Diagnosis not present

## 2021-11-27 DIAGNOSIS — G6181 Chronic inflammatory demyelinating polyneuritis: Secondary | ICD-10-CM | POA: Diagnosis not present

## 2021-11-27 DIAGNOSIS — F32A Depression, unspecified: Secondary | ICD-10-CM | POA: Diagnosis not present

## 2021-11-28 DIAGNOSIS — G6181 Chronic inflammatory demyelinating polyneuritis: Secondary | ICD-10-CM | POA: Diagnosis not present

## 2021-11-29 DIAGNOSIS — G6181 Chronic inflammatory demyelinating polyneuritis: Secondary | ICD-10-CM | POA: Diagnosis not present

## 2021-11-30 DIAGNOSIS — Z7984 Long term (current) use of oral hypoglycemic drugs: Secondary | ICD-10-CM | POA: Diagnosis not present

## 2021-11-30 DIAGNOSIS — F988 Other specified behavioral and emotional disorders with onset usually occurring in childhood and adolescence: Secondary | ICD-10-CM | POA: Diagnosis not present

## 2021-11-30 DIAGNOSIS — G6181 Chronic inflammatory demyelinating polyneuritis: Secondary | ICD-10-CM | POA: Diagnosis not present

## 2021-11-30 DIAGNOSIS — F410 Panic disorder [episodic paroxysmal anxiety] without agoraphobia: Secondary | ICD-10-CM | POA: Diagnosis not present

## 2021-11-30 DIAGNOSIS — K589 Irritable bowel syndrome without diarrhea: Secondary | ICD-10-CM | POA: Diagnosis not present

## 2021-11-30 DIAGNOSIS — Z87891 Personal history of nicotine dependence: Secondary | ICD-10-CM | POA: Diagnosis not present

## 2021-11-30 DIAGNOSIS — M5441 Lumbago with sciatica, right side: Secondary | ICD-10-CM | POA: Diagnosis not present

## 2021-11-30 DIAGNOSIS — E7800 Pure hypercholesterolemia, unspecified: Secondary | ICD-10-CM | POA: Diagnosis not present

## 2021-11-30 DIAGNOSIS — F32A Depression, unspecified: Secondary | ICD-10-CM | POA: Diagnosis not present

## 2021-11-30 DIAGNOSIS — I10 Essential (primary) hypertension: Secondary | ICD-10-CM | POA: Diagnosis not present

## 2021-11-30 DIAGNOSIS — G4733 Obstructive sleep apnea (adult) (pediatric): Secondary | ICD-10-CM | POA: Diagnosis not present

## 2021-11-30 DIAGNOSIS — E1136 Type 2 diabetes mellitus with diabetic cataract: Secondary | ICD-10-CM | POA: Diagnosis not present

## 2021-11-30 DIAGNOSIS — N39498 Other specified urinary incontinence: Secondary | ICD-10-CM | POA: Diagnosis not present

## 2021-11-30 DIAGNOSIS — Z794 Long term (current) use of insulin: Secondary | ICD-10-CM | POA: Diagnosis not present

## 2021-11-30 DIAGNOSIS — N401 Enlarged prostate with lower urinary tract symptoms: Secondary | ICD-10-CM | POA: Diagnosis not present

## 2021-11-30 DIAGNOSIS — J849 Interstitial pulmonary disease, unspecified: Secondary | ICD-10-CM | POA: Diagnosis not present

## 2021-11-30 DIAGNOSIS — Z6841 Body Mass Index (BMI) 40.0 and over, adult: Secondary | ICD-10-CM | POA: Diagnosis not present

## 2021-11-30 DIAGNOSIS — I2510 Atherosclerotic heart disease of native coronary artery without angina pectoris: Secondary | ICD-10-CM | POA: Diagnosis not present

## 2021-11-30 DIAGNOSIS — E1142 Type 2 diabetes mellitus with diabetic polyneuropathy: Secondary | ICD-10-CM | POA: Diagnosis not present

## 2021-11-30 DIAGNOSIS — M1990 Unspecified osteoarthritis, unspecified site: Secondary | ICD-10-CM | POA: Diagnosis not present

## 2021-11-30 DIAGNOSIS — E1121 Type 2 diabetes mellitus with diabetic nephropathy: Secondary | ICD-10-CM | POA: Diagnosis not present

## 2021-12-01 ENCOUNTER — Ambulatory Visit (INDEPENDENT_AMBULATORY_CARE_PROVIDER_SITE_OTHER): Payer: Medicare HMO

## 2021-12-01 DIAGNOSIS — G6181 Chronic inflammatory demyelinating polyneuritis: Secondary | ICD-10-CM

## 2021-12-01 DIAGNOSIS — R531 Weakness: Secondary | ICD-10-CM | POA: Diagnosis not present

## 2021-12-01 DIAGNOSIS — Z79899 Other long term (current) drug therapy: Secondary | ICD-10-CM | POA: Diagnosis not present

## 2021-12-01 DIAGNOSIS — M546 Pain in thoracic spine: Secondary | ICD-10-CM | POA: Diagnosis not present

## 2021-12-01 DIAGNOSIS — E1140 Type 2 diabetes mellitus with diabetic neuropathy, unspecified: Secondary | ICD-10-CM | POA: Diagnosis not present

## 2021-12-01 DIAGNOSIS — E1165 Type 2 diabetes mellitus with hyperglycemia: Secondary | ICD-10-CM | POA: Diagnosis not present

## 2021-12-01 DIAGNOSIS — I2510 Atherosclerotic heart disease of native coronary artery without angina pectoris: Secondary | ICD-10-CM | POA: Diagnosis not present

## 2021-12-01 DIAGNOSIS — Z794 Long term (current) use of insulin: Secondary | ICD-10-CM | POA: Diagnosis not present

## 2021-12-01 DIAGNOSIS — I10 Essential (primary) hypertension: Secondary | ICD-10-CM

## 2021-12-01 NOTE — Chronic Care Management (AMB) (Signed)
?Chronic Care Management  ? ?CCM RN Visit Note ? ?12/01/2021 ?Name: Tyler Brewer MRN: [id number] DOB: 10-31-1960 ? ?Subjective: ?Tyler Brewer is a 61 y.o. year old male who is a primary care patient of [name], DO. The care management team was consulted for assistance with disease management and care coordination needs.   ? ?Engaged with patient by telephone for follow up visit in response to provider referral for case management and/or care coordination services.  ? ?Consent to Services:  ?The patient was given information about Chronic Care Management services, agreed to services, and gave verbal consent prior to initiation of services.  Please see initial visit note for detailed documentation.  ? ?Patient agreed to services and verbal consent obtained.  ? ?Assessment: Review of patient past medical history, allergies, medications, health status, including review of consultants reports, laboratory and other test data, was performed as part of comprehensive evaluation and provision of chronic care management services.  ? ?SDOH (Social Determinants of Health) assessments and interventions performed:   ? ?CCM Care Plan ? ?No Known Allergies ? ?Outpatient Encounter Medications as of 12/01/2021  ?Medication Sig Note  ? acetaminophen (TYLENOL) 500 MG tablet Take 500-1,000 mg by mouth every 6 (six) hours as needed for mild pain.   ? amLODipine (NORVASC) 10 MG tablet Take 1 tablet (10 mg total) by mouth daily.   ? aspirin EC 81 MG tablet Take 1 tablet (81 mg total) by mouth daily. Swallow whole.   ? calcium carbonate (TUMS - DOSED IN MG ELEMENTAL CALCIUM) 500 MG chewable tablet Chew 1 tablet (200 mg of elemental calcium total) by mouth every 6 (six) hours as needed for indigestion or heartburn.   ? Cyanocobalamin (VITAMIN B-12 PO) Take 1 tablet by mouth daily.   ? DULoxetine (CYMBALTA) 60 MG capsule Take 60 mg by mouth daily.   ? furosemide (LASIX) 20 MG tablet Take 1 tablet (20 mg total) by mouth 2 (two)  times daily.   ? gabapentin (NEURONTIN) 300 MG capsule Take 2 capsules (600 mg total) by mouth 3 (three) times daily. (Patient taking differently: Take 1,200 mg by mouth 3 (three) times daily.) 10/30/2021: Reports 4 capsules three times/day. Reports increased per neurologist.  ? hydrALAZINE (APRESOLINE) 50 MG tablet TAKE 1 TABLET(50 MG) BY MOUTH EVERY 8 HOURS   ? hydrOXYzine (ATARAX) 25 MG tablet Take 1 tablet (25 mg total) by mouth every 6 (six) hours as needed for anxiety. (Patient taking differently: Take 25 mg by mouth at bedtime.)   ? insulin aspart (NOVOLOG FLEXPEN) 100 UNIT/ML FlexPen Max daily 80 units per scale (Patient taking differently: Inject 0-80 Units into the skin See admin instructions. Max daily 80 units per scale)   ? insulin degludec (TRESIBA FLEXTOUCH) 100 UNIT/ML FlexTouch Pen Inject 30 Units into the skin daily.   ? losartan (COZAAR) 100 MG tablet TAKE ONE TABLET BY MOUTH DAILY   ? MAGNESIUM PO Take 1 tablet by mouth daily.   ? metFORMIN (GLUCOPHAGE-XR) 500 MG 24 hr tablet TAKE 2 TABLETS IN THE      MORNING AND AT BEDTIME (Patient taking differently: Take 1,000 mg by mouth in the morning and at bedtime.)   ? methocarbamol (ROBAXIN) 500 MG tablet Take 500 mg by mouth 4 (four) times daily as needed for muscle spasms.   ? Multiple Vitamin (MULTIVITAMIN) tablet Take 1 tablet by mouth daily.   ? nitroGLYCERIN (NITROSTAT) 0.4 MG SL tablet Place 1 tablet (0.4 mg total) under the tongue every 5 (  five) minutes as needed for chest pain.   ? omeprazole (PRILOSEC OTC) 20 MG tablet Take 1 tablet (20 mg total) by mouth daily.   ? pantoprazole (PROTONIX) 20 MG tablet Take 1 tablet (20 mg total) by mouth daily.   ? pravastatin (PRAVACHOL) 40 MG tablet TAKE 1 TABLET DAILY   ? tamsulosin (FLOMAX) 0.4 MG CAPS capsule TAKE ONE CAPSULE BY MOUTH ONE TIME DAILY   ? traMADol (ULTRAM) 50 MG tablet 2 po q6 h prn (Patient taking differently: Take 100 mg by mouth every 6 (six) hours as needed for moderate pain.)  10/30/2021: Reports takes one 50 mg tablet every 6-8 hours as needed  ? AMBULATORY NON FORMULARY MEDICATION Medication Name: Audelia Hives Wheelchair   ? diclofenac Sodium (VOLTAREN) 1 % GEL Apply 4 g topically 4 (four) times daily.   ? immune globulin, human, (GAMMAGARD S/D) 5 g injection Inject into the vein every 21 ( twenty-one) days. Patient not sure of dose 09/29/2021: Patient not sure of dose   ? Insulin Pen Needle 31G X 5 MM MISC 1 Device by Does not apply route in the morning, at noon, in the evening, and at bedtime.   ? lidocaine (LIDODERM) 5 % Place 1 patch onto the skin daily. Remove & Discard patch within 12 hours or as directed by MD   ? NONFORMULARY OR COMPOUNDED [place] wheelchair   ? NONFORMULARY OR COMPOUNDED ITEM [place] wheelchair   ? polyethylene glycol (MIRALAX / GLYCOLAX) 17 g packet Take 17 g by mouth daily as needed for mild constipation.   ? sucralfate (CARAFATE) 1 g tablet Take 1 tablet (1 g total) by mouth 4 (four) times daily -  with meals and at bedtime for 7 days.   ? ?No facility-administered encounter medications on file as of 12/01/2021.  ? ? ?Patient Active Problem List  ? Diagnosis Date Noted  ? Midepigastric pain 11/01/2021  ? Depression with anxiety 11/01/2021  ? Primary hypertension 11/01/2021  ? Elevated troponin 09/28/2021  ? BPH (benign prostatic hyperplasia) 09/28/2021  ? Chronic diastolic CHF (congestive heart failure) ([place]) 09/28/2021  ? CIDP (chronic inflammatory demyelinating polyneuropathy) ([place]) 01/12/2021  ? Diabetes mellitus ([place]) 01/03/2021  ? Uncontrolled type 2 diabetes mellitus with hyperglycemia, with long-term current use of insulin ([place]) 01/03/2021  ? Lower extremity edema 11/28/2020  ? Chronic low back pain 11/28/2020  ? Psoriatic arthritis ([place]) 11/28/2020  ? Thoracic aortic aneurysm without rupture ([place]) 11/28/2020  ? Bilateral leg weakness 11/05/2020  ? Acute left-sided low back pain with right-sided sciatica 07/19/2020  ? Hip pain, acute, left 07/05/2020  ?  Tremor 07/05/2020  ? Weakness 07/05/2020  ? Balance problem 03/11/2020  ? Tinea cruris 03/11/2020  ? Cellulitis of left groin 03/11/2020  ? Acute pain of right shoulder 03/11/2020  ? Interstitial lung disease ([place]) 05/02/2017  ? Pansinusitis 09/26/2016  ? Diabetic polyneuropathy associated with diabetes mellitus due to underlying condition ([place]) 05/08/2016  ? Methamphetamine use disorder, severe, dependence ([place]) 02/11/2016  ? Substance induced mood disorder ([place]) 02/11/2016  ? Exertional dyspnea 11/30/2015  ? ADD (attention deficit disorder) 09/29/2015  ? Binge eating 09/29/2015  ? Syncope 05/05/2012  ? Diarrhea 05/05/2012  ? Panic attacks 05/05/2012  ? OSA (obstructive sleep apnea) 05/05/2012  ? Essential hypertension 05/05/2012  ? Dyslipidemia 05/05/2012  ? Coronary artery disease involving native coronary artery of native heart 04/01/2012  ? Acute left flank pain 04/01/2012  ? Drug abuse and dependence ([place]) 04/01/2012  ? Family history of coronary artery  disease 03/31/2012  ? Sleep apnea, on C-pap 03/31/2012  ? Hyperlipemia 03/30/2012  ? HTN (hypertension), benign 03/30/2012  ? Morbid obesity ([place]) 03/30/2012  ? DM type 2, uncontrolled, with neuropathy ([place]) 03/30/2012  ? Depression with suicidal ideation 03/30/2012  ? ? ?Conditions to be addressed/monitored:DMII and CDIP/Chronic Pain, Fall prevention ? ?Care Plan : RN Case Manager Plan of Care  ?Updates made by Luretha Rued, RN since 12/01/2021 12:00 AM  ?  ? ?Problem: Chronic Disease Management Education and Care Coordination needs (DM, CDIP/chronic pain)-   ?Priority: High  ?  ? ?Long-Range Goal: Development of Plan of care for Chronic Disease   ?Start Date: 06/29/2021  ?Expected End Date: 05/02/2022  ?Priority: High  ?Note:   ?Current Barriers:  ?3//23 Tyler Brewer reports he continues to receive IVIG infusions at [facility] home health physical therapy one day a week. States he is able to stand with assistance now, but states balance  is still off. He states he is unable to take a step or bend down to pick up something. But reports overall improvement since initiation of IVIG. He also reports overall general pain has improved since IVIG treatme

## 2021-12-01 NOTE — Patient Instructions (Signed)
Visit Information ? ?Thank you for taking time to visit with me today. Please don't hesitate to contact me if I can be of assistance to you before our next scheduled telephone appointment. ? ?Following are the goals we discussed today:  ?Patient Goals/Self-Care Activities: ?Take all medications as prescribed ?Attend all scheduled provider appointments ?Call provider office for new concerns or questions  ?Continue to perform exercises recommended by your home health physical therapist ?Contact RNCM if you have any questions or concerns or community resource needs ? ?Our next appointment is by telephone on 01/04/22 at 3:00 pm ? ?Please call the care guide team at [phone] if you need to cancel or reschedule your appointment.  ? ?If you are experiencing a Mental Health or [place] or need someone to talk to, please call the Suicide and Crisis Lifeline: 988 ?call 1-800-273-TALK (toll free, 24 hour hotline)  ? ?Patient verbalizes understanding of instructions and care plan provided today and agrees to view in [place]. Active MyChart status confirmed with patient.   ? ?Thea Silversmith, RN, MSN, BSN, CCM ?Care Management Coordinator ?[facility] ?[phone]  ?

## 2021-12-05 DIAGNOSIS — N39498 Other specified urinary incontinence: Secondary | ICD-10-CM | POA: Diagnosis not present

## 2021-12-05 DIAGNOSIS — E1121 Type 2 diabetes mellitus with diabetic nephropathy: Secondary | ICD-10-CM | POA: Diagnosis not present

## 2021-12-05 DIAGNOSIS — Z87891 Personal history of nicotine dependence: Secondary | ICD-10-CM | POA: Diagnosis not present

## 2021-12-05 DIAGNOSIS — G6181 Chronic inflammatory demyelinating polyneuritis: Secondary | ICD-10-CM | POA: Diagnosis not present

## 2021-12-05 DIAGNOSIS — M5441 Lumbago with sciatica, right side: Secondary | ICD-10-CM | POA: Diagnosis not present

## 2021-12-05 DIAGNOSIS — K589 Irritable bowel syndrome without diarrhea: Secondary | ICD-10-CM | POA: Diagnosis not present

## 2021-12-05 DIAGNOSIS — M1990 Unspecified osteoarthritis, unspecified site: Secondary | ICD-10-CM | POA: Diagnosis not present

## 2021-12-05 DIAGNOSIS — Z7984 Long term (current) use of oral hypoglycemic drugs: Secondary | ICD-10-CM | POA: Diagnosis not present

## 2021-12-05 DIAGNOSIS — F410 Panic disorder [episodic paroxysmal anxiety] without agoraphobia: Secondary | ICD-10-CM | POA: Diagnosis not present

## 2021-12-05 DIAGNOSIS — Z794 Long term (current) use of insulin: Secondary | ICD-10-CM | POA: Diagnosis not present

## 2021-12-05 DIAGNOSIS — E7800 Pure hypercholesterolemia, unspecified: Secondary | ICD-10-CM | POA: Diagnosis not present

## 2021-12-05 DIAGNOSIS — F32A Depression, unspecified: Secondary | ICD-10-CM | POA: Diagnosis not present

## 2021-12-05 DIAGNOSIS — G4733 Obstructive sleep apnea (adult) (pediatric): Secondary | ICD-10-CM | POA: Diagnosis not present

## 2021-12-05 DIAGNOSIS — E1136 Type 2 diabetes mellitus with diabetic cataract: Secondary | ICD-10-CM | POA: Diagnosis not present

## 2021-12-05 DIAGNOSIS — J849 Interstitial pulmonary disease, unspecified: Secondary | ICD-10-CM | POA: Diagnosis not present

## 2021-12-05 DIAGNOSIS — Z6841 Body Mass Index (BMI) 40.0 and over, adult: Secondary | ICD-10-CM | POA: Diagnosis not present

## 2021-12-05 DIAGNOSIS — N401 Enlarged prostate with lower urinary tract symptoms: Secondary | ICD-10-CM | POA: Diagnosis not present

## 2021-12-05 DIAGNOSIS — E1142 Type 2 diabetes mellitus with diabetic polyneuropathy: Secondary | ICD-10-CM | POA: Diagnosis not present

## 2021-12-05 DIAGNOSIS — I2510 Atherosclerotic heart disease of native coronary artery without angina pectoris: Secondary | ICD-10-CM | POA: Diagnosis not present

## 2021-12-05 DIAGNOSIS — I10 Essential (primary) hypertension: Secondary | ICD-10-CM | POA: Diagnosis not present

## 2021-12-05 DIAGNOSIS — F988 Other specified behavioral and emotional disorders with onset usually occurring in childhood and adolescence: Secondary | ICD-10-CM | POA: Diagnosis not present

## 2021-12-07 DIAGNOSIS — E1136 Type 2 diabetes mellitus with diabetic cataract: Secondary | ICD-10-CM | POA: Diagnosis not present

## 2021-12-07 DIAGNOSIS — Z87891 Personal history of nicotine dependence: Secondary | ICD-10-CM | POA: Diagnosis not present

## 2021-12-07 DIAGNOSIS — Z6841 Body Mass Index (BMI) 40.0 and over, adult: Secondary | ICD-10-CM | POA: Diagnosis not present

## 2021-12-07 DIAGNOSIS — Z794 Long term (current) use of insulin: Secondary | ICD-10-CM | POA: Diagnosis not present

## 2021-12-07 DIAGNOSIS — I10 Essential (primary) hypertension: Secondary | ICD-10-CM | POA: Diagnosis not present

## 2021-12-07 DIAGNOSIS — E1142 Type 2 diabetes mellitus with diabetic polyneuropathy: Secondary | ICD-10-CM | POA: Diagnosis not present

## 2021-12-07 DIAGNOSIS — F410 Panic disorder [episodic paroxysmal anxiety] without agoraphobia: Secondary | ICD-10-CM | POA: Diagnosis not present

## 2021-12-07 DIAGNOSIS — E1121 Type 2 diabetes mellitus with diabetic nephropathy: Secondary | ICD-10-CM | POA: Diagnosis not present

## 2021-12-07 DIAGNOSIS — Z7984 Long term (current) use of oral hypoglycemic drugs: Secondary | ICD-10-CM | POA: Diagnosis not present

## 2021-12-07 DIAGNOSIS — E7800 Pure hypercholesterolemia, unspecified: Secondary | ICD-10-CM | POA: Diagnosis not present

## 2021-12-07 DIAGNOSIS — F32A Depression, unspecified: Secondary | ICD-10-CM | POA: Diagnosis not present

## 2021-12-07 DIAGNOSIS — J849 Interstitial pulmonary disease, unspecified: Secondary | ICD-10-CM | POA: Diagnosis not present

## 2021-12-07 DIAGNOSIS — G4733 Obstructive sleep apnea (adult) (pediatric): Secondary | ICD-10-CM | POA: Diagnosis not present

## 2021-12-07 DIAGNOSIS — G6181 Chronic inflammatory demyelinating polyneuritis: Secondary | ICD-10-CM | POA: Diagnosis not present

## 2021-12-07 DIAGNOSIS — F988 Other specified behavioral and emotional disorders with onset usually occurring in childhood and adolescence: Secondary | ICD-10-CM | POA: Diagnosis not present

## 2021-12-07 DIAGNOSIS — I2510 Atherosclerotic heart disease of native coronary artery without angina pectoris: Secondary | ICD-10-CM | POA: Diagnosis not present

## 2021-12-07 DIAGNOSIS — K589 Irritable bowel syndrome without diarrhea: Secondary | ICD-10-CM | POA: Diagnosis not present

## 2021-12-07 DIAGNOSIS — N39498 Other specified urinary incontinence: Secondary | ICD-10-CM | POA: Diagnosis not present

## 2021-12-07 DIAGNOSIS — M5441 Lumbago with sciatica, right side: Secondary | ICD-10-CM | POA: Diagnosis not present

## 2021-12-07 DIAGNOSIS — N401 Enlarged prostate with lower urinary tract symptoms: Secondary | ICD-10-CM | POA: Diagnosis not present

## 2021-12-07 DIAGNOSIS — M1990 Unspecified osteoarthritis, unspecified site: Secondary | ICD-10-CM | POA: Diagnosis not present

## 2021-12-08 ENCOUNTER — Ambulatory Visit: Payer: Medicare HMO | Admitting: Pharmacist

## 2021-12-08 DIAGNOSIS — I10 Essential (primary) hypertension: Secondary | ICD-10-CM

## 2021-12-08 DIAGNOSIS — R1013 Epigastric pain: Secondary | ICD-10-CM

## 2021-12-08 DIAGNOSIS — Z79899 Other long term (current) drug therapy: Secondary | ICD-10-CM

## 2021-12-08 DIAGNOSIS — E1159 Type 2 diabetes mellitus with other circulatory complications: Secondary | ICD-10-CM

## 2021-12-08 MED ORDER — PANTOPRAZOLE SODIUM 20 MG PO TBEC: 20.0000 mg | DELAYED_RELEASE_TABLET | Freq: Every day | ORAL | 1 refills | Status: AC

## 2021-12-08 NOTE — Chronic Care Management (AMB) (Signed)
? ? ?Chronic Care Management ?Pharmacy Note ? ?12/15/2021 ?Name:  Tyler Brewer MRN:  [id number] DOB:  1961/02/25 ? ?Summary:  ?Patient reports has been approved for additional financial assistance with his medical and pharmacy related copays, as well as transportation costs thru [employer] (a fund for rare diseases - approved for his CIDP - chronic inflammatory demyelinating polyneuropathy).  Tried to assist patient in requesting his usual four medications he gets thru [facility] mail order (pravastatin, tamsulosin, losartan and metformin) for $0 however they stated patient had a balance fo about $40 that he had to pay first. It appears hydroxyzine Rx was sent to mail order and was not a $0 copay. Was able to provider secondary coverage information to [facility]. They will submit information to have coverage added which will take up to 48 hours. Can then request for Rx to be reprocessed. Patient stated he has enough medication to wait on order.  ?Patient reports his blood glucose has been elevated recently. Mostly in the 200's and sometimes just reads high. He has not contacted his endocrinologist yet. Recommended he increase Tresiba by 2 units to 32 units daily over the next 2 to 3 days, if FBG still > 120, then increase by 2 more units to 34 units daily. Continue to use Novolog per sliding scale. Encouraged him to contact Dr [name] regarding elevated blood glucose.  ?Reviewed medication list - removed omeprazole from list - patient is now taking pantoprazole 40mg  daily. ?Reminded him to check blood pressure 2 to 3 times per week. And continue to chech blood glucose with Continuous Glucose Monitor.  ? ?Subjective: ?Tyler Brewer is an 61 y.o. year old male who is a primary patient of [name], DO.  The CCM team was consulted for assistance with disease management and care coordination needs.   ? ?Engaged with patient by telephone for  follow up   in response to provider referral for  pharmacy case management and/or care coordination services.  ? ?Consent to Services:  ?The patient was given information about Chronic Care Management services, agreed to services, and gave verbal consent prior to initiation of services.  Please see initial visit note for detailed documentation.  ? ?Patient Care Team: ?Carollee Herter, Alferd Apa, DO as PCP - General (Family Medicine) ?Lelon Perla, MD as PCP - Cardiology (Cardiology) ?Alda Berthold, DO as Consulting Physician (Neurology) ?Lajuana Matte, MD as Consulting Physician (Cardiothoracic Surgery) ?Marlaine Hind, MD as Consulting Physician (Physical Medicine and Rehabilitation) ?Luretha Rued, RN as Case Manager ?Cherre Robins, RPH-CPP (Pharmacist) ?[name], Melanie Crazier, MD as Consulting Physician (Endocrinology) ?Haddix, Thomasene Lot, MD as Consulting Physician (Orthopedic Surgery) ? ?Recent office visits: ?10/31/2021 - Fam Med (Dr Etter Sjogren) Seen for abdominal pain. Recommended start protonix. Referred to GI.  ?09/08/2021 - PCP (Dr Carollee Herter) Seen for follow up and lower extremity edema. Changed furosemide to torsemide 20mg  daily  ?06/30/2021 - Fam Med (Dr Etter Sjogren Cheri Rous) Seen for edema / pain / anxiety. Furosemide 40mg  for a few days then back to 20mg  daily; Refilled hydroxyzine 25mg  up to every 6 hours as needed. Refilled tramadol. Has appt to see pain management soon ? ?  ?Recent consult visits: ?12/01/2021 - Neurology (Dr Mikle Bosworth) Telemed Visit for chronic inflammatory demyelinating polyneuropathy. No med changes noted. F/U 4 months.  ?11/29/2021 and 11/28/2021 - IVIG infusion ?11/15/2021 - Cardio Sarajane Jews, [facility]) seen for hospital follow up for atypical CP.Coronary CTA on 11/13/2021 showed coronary calcium score of 1290. Increased  Lasix to 40mg  in the morning and 20mg  in the evening for 3 days and then patient can return to current dosing of 20mg  twice daily. F/U 3 months ?11/07/2021 and 11/08/2021 - IVIG infusion ?10/04/2021 - Neurology (Dr  Christianne Dolin for worsening lower extremity weakness. 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 Recommended continue IVIG every 3 weeks given over 2 days ?09/14/2021 - Endo (Dr [name]) Phone call - uncontrolled BG. Please increase your NovoLog to 20 units with breakfast, 24 units with lunch and 24 units with supper. Also recommended use Novolog correctional insulin: ADD extra units on insulin to your meal-time novolog dose if your blood sugars are higher than 150. (See notes for sliding / correctional scale recommended)  ?07/27/2021 - IVIG infusion at [place] for chronic inflammatory demyelinating polyneuropathy ?07/25/2021 - IVIG infusion at [facility] for CIDP.  ?07/11/2021 - Endo (Dr [name]) F/U diabetes, Decreased NPH does to 30 units daily. Increased Reg insuiln to 14 units wiht breakfast; 16 units with lunch and evening meals. Advised to stop nighttime snacking ?06/27/2021 - Neurology (Dr Mikle Bosworth) neuropathy and worsening lower extremity weakness. continue IVIG 1 g/kg q 3 weeks, given over 2 days in [facility]. Glucose elevated today, recommend strict glycemic control as hyperglycemia can worsen underlying neuropathy. CMP reviewed, Cr WNL, glucose elevated, Na slightly low in the setting of hyperglycemia. CBC unremarkable. Continue Gabapentin and Duloxetine for neuropathic pain ?07/04/2021 and 11/16/20222 - CIPD infusion of IVIG ?06/13/2021 and 06/14/2021- CIPD infusion of IVIG ?  ?  ?Hospital visits: ?10/27/2021 ED Visit at [facility]. GIven IV pantoprazole, Mylanta. Recommended continue Rx pantoprazole for at least 2 weeks and f/u with PCP. New medications at discharge pantoprazole 20mg  daily . ?10/25/2021 - ED Visit - non specific chest pain. R/O cardiac causes. Given Maalox in ED. Also provided with Rx for diclofenac topial gel to use up to 4 times a day ?10/20/2021 - ED Visit at [facility]. Seen for abdominal pain. imaging studies which included CXR, CT A/p and I  reviewed imaging which showed no acute process. GIven GI cocktain in ED and anagesics. Prescribed dicycloina and sucralfate at discharge.  ?10/02/2021 - ED Visit at [place] presented with SOB for 7 to 10 days and chest pain ?07/21/2021 - ED Visit at [facility] for flank pain.  Prescribed Miralax 17 grams daily as needed. Ultrasound negative for hydronephrosis.  ?06/24/2021 - ED - [facility] - for edema per EMS report. Unable to see notes from ED visit.  ? ? ?Objective: ? ?Lab Results  ?Component Value Date  ? CREATININE 0.96 11/16/2021  ? CREATININE 0.84 10/27/2021  ? CREATININE 0.94 10/25/2021  ? ? ?Lab Results  ?Component Value Date  ? HGBA1C 8.5 (H) 09/29/2021  ? ?Last diabetic Eye exam: No results found for: HMDIABEYEEXA  ?Last diabetic Foot exam: No results found for: HMDIABFOOTEX  ? ?   ?Component Value Date/Time  ? CHOL 140 05/18/2021 1612  ? TRIG 104.0 05/18/2021 1612  ? HDL 56.60 05/18/2021 1612  ? CHOLHDL 2 05/18/2021 1612  ? VLDL 20.8 05/18/2021 1612  ? [place] 63 05/18/2021 1612  ? LDLDIRECT 124.0 05/02/2017 1507  ? ? ? ?  Latest Ref Rng & Units 10/27/2021  ?  4:50 PM 10/25/2021  ?  9:29 AM 10/09/2021  ? 10:12 AM  ?Hepatic Function  ?Total Protein 6.5 - 8.1 g/dL 7.1   7.1   6.9    ?Albumin 3.5 - 5.0 g/dL 3.3  3.3   3.6    ?AST 15 - 41 U/L _0 ?ALT 0 - 44 U/L _1 ?Alk Phosphatase 38 - 126 U/L 75   72   69    ?Total Bilirubin 0.3 - 1.2 mg/dL 0.3   0.5   0.3    ? ? ?Lab Results  ?Component Value Date/Time  ? TSH 2.84 09/08/2021 02:17 PM  ? TSH 2.57 02/14/2021 02:19 PM  ? ? ? ?  Latest Ref Rng & Units 10/27/2021  ?  4:29 PM 10/25/2021  ?  9:29 AM 10/09/2021  ? 10:12 AM  ?CBC  ?WBC 4.0 - 10.5 K/uL 6.6   5.2   5.6    ?Hemoglobin 13.0 - 17.0 g/dL 13.8   13.5   13.7    ?Hematocrit 39.0 - 52.0 % 40.8   40.7   40.4    ?Platelets 150 - 400 K/uL 234   242   276    ? ? ?No results found for: VD25OH ? ?Clinical ASCVD: Yes  ?The 10-year ASCVD risk score (Arnett DK, et al.,  2019) is: 7.6% ?  Values used to calculate the score: ?    Age: 49 years ?    Sex: Male ?    Is Non-Hispanic African American: No ?    Diabetic: Yes ?    Tobacco smoker: No ?    Systolic Blood Pressure:

## 2021-12-10 ENCOUNTER — Other Ambulatory Visit: Payer: Self-pay | Admitting: Family Medicine

## 2021-12-10 DIAGNOSIS — M545 Low back pain, unspecified: Secondary | ICD-10-CM

## 2021-12-11 ENCOUNTER — Encounter: Payer: Self-pay | Admitting: Family Medicine

## 2021-12-11 ENCOUNTER — Telehealth: Payer: Self-pay | Admitting: Pharmacist

## 2021-12-11 DIAGNOSIS — M545 Low back pain, unspecified: Secondary | ICD-10-CM

## 2021-12-11 MED ORDER — TRAMADOL HCL 50 MG PO TABS: ORAL_TABLET | ORAL | 1 refills | Status: AC

## 2021-12-11 NOTE — Telephone Encounter (Signed)
Requesting: Tramadol ?Contract: N/A ?UDS: N/A  ?Last OV: 10/31/2021 ?Next OV: 12/14/2021 ?Last Refill: 09/08/2021, #120-1 RF ?Database: ? ? ?Please advise  ? ?

## 2021-12-11 NOTE — Telephone Encounter (Signed)
Patient sent My Chart Message  ?

## 2021-12-11 NOTE — Telephone Encounter (Signed)
Pt called back asking to speak with Tammy. She was unavailable at the time but said she would call the pt back once she could. ?

## 2021-12-11 NOTE — Telephone Encounter (Signed)
Patient has secondary coverage thru [employer] (See card info below)> working to try to get [facility] to process [employer] for his last hydroxyzine fill due to cost of $40.81. Patient states he cannot afford this amount and [facility] will not fill his other maintenance medications (even though they are $0) until bill is pain.  ?Provided [facility] information. They are sending to special billing department to see if they can Secondary school teacher. Reports that will be back in touch within 48 hours.  ? ?Left message with patient regarding above. [employer] recommends call back in 2 to 3 days to ask for hydroxyzine to be reprocessed. Asked patient to call me if he has less than 7 to 10 days of his maintenance medication and we can send to local pharmacy. ? ? ? ?Assistance Card information ?(Newest Message First) ?Tyler Brewer  You 3 days ago  ? ?Sending pic   let me know if you can read that ? Attachments  ?[id number]  ?[id number]  ?[id number] ?  ? ?You  Tyler Brewer 3 days ago  ? ?TE ?Mr. Tyler Brewer,  ?Please send me the information on [employer] Assistance [employer] Card and I can try to get it added either tomorrow or Monday. I might have to add you in with a conference call.  ?  ?Thanks,  ?Tyler Brewer ?

## 2021-12-11 NOTE — Telephone Encounter (Signed)
You sent in today and they sent it back. Looks like they may have changed he sig on this? ?

## 2021-12-12 DIAGNOSIS — M1990 Unspecified osteoarthritis, unspecified site: Secondary | ICD-10-CM | POA: Diagnosis not present

## 2021-12-12 DIAGNOSIS — Z87891 Personal history of nicotine dependence: Secondary | ICD-10-CM | POA: Diagnosis not present

## 2021-12-12 DIAGNOSIS — E1142 Type 2 diabetes mellitus with diabetic polyneuropathy: Secondary | ICD-10-CM | POA: Diagnosis not present

## 2021-12-12 DIAGNOSIS — F988 Other specified behavioral and emotional disorders with onset usually occurring in childhood and adolescence: Secondary | ICD-10-CM | POA: Diagnosis not present

## 2021-12-12 DIAGNOSIS — E7800 Pure hypercholesterolemia, unspecified: Secondary | ICD-10-CM | POA: Diagnosis not present

## 2021-12-12 DIAGNOSIS — N401 Enlarged prostate with lower urinary tract symptoms: Secondary | ICD-10-CM | POA: Diagnosis not present

## 2021-12-12 DIAGNOSIS — Z6841 Body Mass Index (BMI) 40.0 and over, adult: Secondary | ICD-10-CM | POA: Diagnosis not present

## 2021-12-12 DIAGNOSIS — Z794 Long term (current) use of insulin: Secondary | ICD-10-CM | POA: Diagnosis not present

## 2021-12-12 DIAGNOSIS — G4733 Obstructive sleep apnea (adult) (pediatric): Secondary | ICD-10-CM | POA: Diagnosis not present

## 2021-12-12 DIAGNOSIS — I10 Essential (primary) hypertension: Secondary | ICD-10-CM | POA: Diagnosis not present

## 2021-12-12 DIAGNOSIS — J849 Interstitial pulmonary disease, unspecified: Secondary | ICD-10-CM | POA: Diagnosis not present

## 2021-12-12 DIAGNOSIS — F410 Panic disorder [episodic paroxysmal anxiety] without agoraphobia: Secondary | ICD-10-CM | POA: Diagnosis not present

## 2021-12-12 DIAGNOSIS — E1136 Type 2 diabetes mellitus with diabetic cataract: Secondary | ICD-10-CM | POA: Diagnosis not present

## 2021-12-12 DIAGNOSIS — N39498 Other specified urinary incontinence: Secondary | ICD-10-CM | POA: Diagnosis not present

## 2021-12-12 DIAGNOSIS — K589 Irritable bowel syndrome without diarrhea: Secondary | ICD-10-CM | POA: Diagnosis not present

## 2021-12-12 DIAGNOSIS — Z7984 Long term (current) use of oral hypoglycemic drugs: Secondary | ICD-10-CM | POA: Diagnosis not present

## 2021-12-12 DIAGNOSIS — M5441 Lumbago with sciatica, right side: Secondary | ICD-10-CM | POA: Diagnosis not present

## 2021-12-12 DIAGNOSIS — E1121 Type 2 diabetes mellitus with diabetic nephropathy: Secondary | ICD-10-CM | POA: Diagnosis not present

## 2021-12-12 DIAGNOSIS — I2510 Atherosclerotic heart disease of native coronary artery without angina pectoris: Secondary | ICD-10-CM | POA: Diagnosis not present

## 2021-12-12 DIAGNOSIS — F32A Depression, unspecified: Secondary | ICD-10-CM | POA: Diagnosis not present

## 2021-12-12 DIAGNOSIS — G6181 Chronic inflammatory demyelinating polyneuritis: Secondary | ICD-10-CM | POA: Diagnosis not present

## 2021-12-13 DIAGNOSIS — M5441 Lumbago with sciatica, right side: Secondary | ICD-10-CM | POA: Diagnosis not present

## 2021-12-13 DIAGNOSIS — Z87891 Personal history of nicotine dependence: Secondary | ICD-10-CM | POA: Diagnosis not present

## 2021-12-13 DIAGNOSIS — M1990 Unspecified osteoarthritis, unspecified site: Secondary | ICD-10-CM | POA: Diagnosis not present

## 2021-12-13 DIAGNOSIS — I10 Essential (primary) hypertension: Secondary | ICD-10-CM | POA: Diagnosis not present

## 2021-12-13 DIAGNOSIS — E1136 Type 2 diabetes mellitus with diabetic cataract: Secondary | ICD-10-CM | POA: Diagnosis not present

## 2021-12-13 DIAGNOSIS — G4733 Obstructive sleep apnea (adult) (pediatric): Secondary | ICD-10-CM | POA: Diagnosis not present

## 2021-12-13 DIAGNOSIS — F410 Panic disorder [episodic paroxysmal anxiety] without agoraphobia: Secondary | ICD-10-CM | POA: Diagnosis not present

## 2021-12-13 DIAGNOSIS — E7800 Pure hypercholesterolemia, unspecified: Secondary | ICD-10-CM | POA: Diagnosis not present

## 2021-12-13 DIAGNOSIS — Z794 Long term (current) use of insulin: Secondary | ICD-10-CM | POA: Diagnosis not present

## 2021-12-13 DIAGNOSIS — K589 Irritable bowel syndrome without diarrhea: Secondary | ICD-10-CM | POA: Diagnosis not present

## 2021-12-13 DIAGNOSIS — F988 Other specified behavioral and emotional disorders with onset usually occurring in childhood and adolescence: Secondary | ICD-10-CM | POA: Diagnosis not present

## 2021-12-13 DIAGNOSIS — E1142 Type 2 diabetes mellitus with diabetic polyneuropathy: Secondary | ICD-10-CM | POA: Diagnosis not present

## 2021-12-13 DIAGNOSIS — N401 Enlarged prostate with lower urinary tract symptoms: Secondary | ICD-10-CM | POA: Diagnosis not present

## 2021-12-13 DIAGNOSIS — N39498 Other specified urinary incontinence: Secondary | ICD-10-CM | POA: Diagnosis not present

## 2021-12-13 DIAGNOSIS — E1121 Type 2 diabetes mellitus with diabetic nephropathy: Secondary | ICD-10-CM | POA: Diagnosis not present

## 2021-12-13 DIAGNOSIS — G6181 Chronic inflammatory demyelinating polyneuritis: Secondary | ICD-10-CM | POA: Diagnosis not present

## 2021-12-13 DIAGNOSIS — F32A Depression, unspecified: Secondary | ICD-10-CM | POA: Diagnosis not present

## 2021-12-13 DIAGNOSIS — I2510 Atherosclerotic heart disease of native coronary artery without angina pectoris: Secondary | ICD-10-CM | POA: Diagnosis not present

## 2021-12-13 DIAGNOSIS — Z7984 Long term (current) use of oral hypoglycemic drugs: Secondary | ICD-10-CM | POA: Diagnosis not present

## 2021-12-13 DIAGNOSIS — Z6841 Body Mass Index (BMI) 40.0 and over, adult: Secondary | ICD-10-CM | POA: Diagnosis not present

## 2021-12-13 DIAGNOSIS — J849 Interstitial pulmonary disease, unspecified: Secondary | ICD-10-CM | POA: Diagnosis not present

## 2021-12-14 ENCOUNTER — Ambulatory Visit: Payer: Medicare HMO | Admitting: Family Medicine

## 2021-12-15 ENCOUNTER — Encounter: Payer: Self-pay | Admitting: Family Medicine

## 2021-12-15 ENCOUNTER — Ambulatory Visit (INDEPENDENT_AMBULATORY_CARE_PROVIDER_SITE_OTHER): Payer: Medicare HMO | Admitting: Family Medicine

## 2021-12-15 VITALS — BP 108/60 | HR 97 | Temp 98.1°F | Resp 20 | Ht 71.0 in

## 2021-12-15 DIAGNOSIS — Z79899 Other long term (current) drug therapy: Secondary | ICD-10-CM

## 2021-12-15 DIAGNOSIS — N3941 Urge incontinence: Secondary | ICD-10-CM | POA: Diagnosis not present

## 2021-12-15 DIAGNOSIS — M79672 Pain in left foot: Secondary | ICD-10-CM

## 2021-12-15 DIAGNOSIS — M79671 Pain in right foot: Secondary | ICD-10-CM | POA: Diagnosis not present

## 2021-12-15 DIAGNOSIS — E1165 Type 2 diabetes mellitus with hyperglycemia: Secondary | ICD-10-CM | POA: Diagnosis not present

## 2021-12-15 DIAGNOSIS — Z794 Long term (current) use of insulin: Secondary | ICD-10-CM

## 2021-12-15 MED ORDER — MIRABEGRON ER 50 MG PO TB24: 50.0000 mg | ORAL_TABLET | Freq: Every day | ORAL | 5 refills | Status: DC

## 2021-12-15 NOTE — Progress Notes (Signed)
? ?Subjective:  ? ?By signing my name below, I, [name], attest that this documentation has been prepared under the direction and in the presence of [name], DO. 12/15/2021   ? ? Patient ID: Tyler Brewer, male    DOB: 05/01/1961, 61 y.o.   MRN: [id number] ? ?Chief Complaint  ?Patient presents with  ? Foot Swelling  ?  Pt states having swelling and pain; bilateral.  ? ? ?HPI ?Patient is in today for an office visit. ? ?He reports urinary incontinence and not being able to hold his urine before going the bathroom . He does not think he has a UTI at this time. He thinks the problem is with his prostate. ? ?He is complaining of problems with his feet and would like to see a podiatrist. He has been fitted for diabetic shoes. ? ?He is trying to be more independent and do things on his own since he just lost his primary caretaker. He is under a lot of stress as a result.  ? ?He is requesting for a refill on 50 mg myrbetriq. ? ?Past Medical History:  ?Diagnosis Date  ? Arthritis   ? CAD (coronary artery disease)  cardiac cath with moderate disease in a septal branch of the ramus intermedius 04/01/2012  ? CIDP (chronic inflammatory demyelinating polyneuropathy) (HCC)   ? Depression   ? Diabetes mellitus   ? poorly controlled by his report  ? History of narcotic addiction ([place])   ? past history of back pain  ? Hypercholesteremia   ? Hypertension   ? IBS (irritable bowel syndrome)   ? 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 Neuropathy   ? Obesity   ? Max weight was 390  ? OSA on CPAP   ? Panic attacks   ? Testosterone deficiency   ? Vertigo   ? ? ?Past Surgical History:  ?Procedure Laterality Date  ? CARDIAC CATHETERIZATION    ? IR FLUORO GUIDE CV LINE RIGHT  11/10/2020  ? IR US GUIDE VASC ACCESS RIGHT  11/10/2020  ? LEFT HEART CATHETERIZATION WITH CORONARY ANGIOGRAM N/A 03/31/2012  ? Procedure: LEFT HEART CATHETERIZATION WITH CORONARY ANGIOGRAM;  Surgeon: Leonie Man, MD;  Location: [facility] CATH LAB;  Service:  Cardiovascular;  Laterality: N/A;  ? ? ?Family History  ?Problem Relation Age of Onset  ? Diabetes type II Father   ? Hypertension Father   ? Pancreatic disease Father 9  ?     Deceased  ? Healthy Mother   ? Healthy Sister   ? Healthy Son   ? Healthy Daughter   ? Parkinson's disease Maternal Grandmother   ? ? ?Social History  ? ?Socioeconomic History  ? Marital status: Single  ?  Spouse name: Not on file  ? Number of children: 2  ? Years of education: Not on file  ? Highest education level: Not on file  ?Occupational History  ? Not on file  ?Tobacco Use  ? Smoking status: Former  ?  Packs/day: 0.50  ?  Types: Cigarettes  ?  Quit date: 04/27/2021  ?  Years since quitting: 0.6  ? Smokeless tobacco: Never  ?Vaping Use  ? Vaping Use: Never used  ?Substance and Sexual Activity  ? Alcohol use: No  ? Drug use: Not Currently  ?  Types: Methamphetamines  ? Sexual activity: Yes  ?Other Topics Concern  ? Not on file  ?Social History Narrative  ?   Has 2 grown children.    ?  Works as a Barrister's clerk.  Education: Oceanographer.  ? Right Handed  ? Drinks Caffeine   ? Mother recently moved and sold her house-- pt is living with a friend in a trailer   ? ?Social Determinants of Health  ? ?Financial Resource Strain: Low Risk   ? Difficulty of Paying Living Expenses: Not very hard  ?Food Insecurity: Not on file  ?Transportation Needs: Unmet Transportation Needs  ? Lack of Transportation (Medical): Yes  ? Lack of Transportation (Non-Medical): Yes  ?Physical Activity: Insufficiently Active  ? Days of Exercise per Week: 2 days  ? Minutes of Exercise per Session: 30 min  ?Stress: Not on file  ?Social Connections: Not on file  ?Intimate Partner Violence: Not on file  ? ? ?Outpatient Medications Prior to Visit  ?Medication Sig Dispense Refill  ? acetaminophen (TYLENOL) 500 MG tablet Take 500-1,000 mg by mouth every 6 (six) hours as needed for mild pain.    ? AMBULATORY NON FORMULARY MEDICATION Medication Name: Audelia Hives Wheelchair 1 Device 0  ?  amLODipine (NORVASC) 10 MG tablet Take 1 tablet (10 mg total) by mouth daily. 90 tablet 1  ? aspirin EC 81 MG tablet Take 1 tablet (81 mg total) by mouth daily. Swallow whole. 150 tablet 2  ? calcium carbonate (TUMS - DOSED IN MG ELEMENTAL CALCIUM) 500 MG chewable tablet Chew 1 tablet (200 mg of elemental calcium total) by mouth every 6 (six) hours as needed for indigestion or heartburn. 30 tablet 0  ? Cyanocobalamin (VITAMIN B-12 PO) Take 1 tablet by mouth daily.    ? diclofenac Sodium (VOLTAREN) 1 % GEL Apply 4 g topically 4 (four) times daily. 100 g 0  ? DULoxetine (CYMBALTA) 60 MG capsule Take 60 mg by mouth daily.    ? furosemide (LASIX) 20 MG tablet Take 1 tablet (20 mg total) by mouth 2 (two) times daily. 60 tablet 0  ? gabapentin (NEURONTIN) 300 MG capsule Take 2 capsules (600 mg total) by mouth 3 (three) times daily. (Patient taking differently: Take 1,200 mg by mouth 3 (three) times daily.) 180 capsule 0  ? hydrALAZINE (APRESOLINE) 50 MG tablet TAKE 1 TABLET(50 MG) BY MOUTH EVERY 8 HOURS 270 tablet 0  ? hydrOXYzine (ATARAX) 25 MG tablet Take 1 tablet (25 mg total) by mouth every 6 (six) hours as needed for anxiety. 90 tablet 1  ? immune globulin, human, (GAMMAGARD S/D) 5 g injection Inject into the vein every 21 ( twenty-one) days. Patient not sure of dose    ? insulin aspart (NOVOLOG FLEXPEN) 100 UNIT/ML FlexPen Max daily 80 units per scale (Patient taking differently: Inject 0-80 Units into the skin See admin instructions. Max daily 80 units per scale) 45 mL 6  ? insulin degludec (TRESIBA FLEXTOUCH) 100 UNIT/ML FlexTouch Pen Inject 30 Units into the skin daily. 30 mL 3  ? Insulin Pen Needle 31G X 5 MM MISC 1 Device by Does not apply route in the morning, at noon, in the evening, and at bedtime. 400 each 3  ? lidocaine (LIDODERM) 5 % Place 1 patch onto the skin daily. Remove & Discard patch within 12 hours or as directed by MD 30 patch 0  ? losartan (COZAAR) 100 MG tablet TAKE ONE TABLET BY MOUTH DAILY 90  tablet 1  ? MAGNESIUM PO Take 1 tablet by mouth daily.    ? meloxicam (MOBIC) 15 MG tablet Take 15 mg by mouth daily.    ? metFORMIN (GLUCOPHAGE-XR) 500 MG 24 hr tablet TAKE  2 TABLETS IN THE      MORNING AND AT BEDTIME (Patient taking differently: Take 1,000 mg by mouth 2 (two) times daily after a meal.) 360 tablet 1  ? methocarbamol (ROBAXIN) 500 MG tablet Take 500 mg by mouth 4 (four) times daily as needed for muscle spasms.    ? Multiple Vitamin (MULTIVITAMIN) tablet Take 1 tablet by mouth daily.    ? nitroGLYCERIN (NITROSTAT) 0.4 MG SL tablet Place 1 tablet (0.4 mg total) under the tongue every 5 (five) minutes as needed for chest pain. 14 tablet 0  ? NONFORMULARY OR COMPOUNDED ITEM Power wheelchair 1 each 0  ? NONFORMULARY OR COMPOUNDED ITEM Manuel wheelchair 1 each 0  ? pantoprazole (PROTONIX) 20 MG tablet Take 1 tablet (20 mg total) by mouth daily. 90 tablet 1  ? polyethylene glycol (MIRALAX / GLYCOLAX) 17 g packet Take 17 g by mouth daily as needed for mild constipation. 14 each 0  ? pravastatin (PRAVACHOL) 40 MG tablet TAKE 1 TABLET DAILY 90 tablet 1  ? tamsulosin (FLOMAX) 0.4 MG CAPS capsule TAKE ONE CAPSULE BY MOUTH ONE TIME DAILY 90 capsule 1  ? traMADol (ULTRAM) 50 MG tablet TAKE 2 TABLETS BY MOUTH EVERY 6 HOURS AS NEEDED 120 tablet 0  ? traMADol (ULTRAM) 50 MG tablet 2 po q6 h prn 120 tablet 1  ? sucralfate (CARAFATE) 1 g tablet Take 1 tablet (1 g total) by mouth 4 (four) times daily -  with meals and at bedtime for 7 days. 28 tablet 0  ? ?No facility-administered medications prior to visit.  ? ? ?No Known Allergies ? ?Review of Systems  ?Constitutional:  Negative for fever.  ?HENT:  Negative for congestion, ear pain, hearing loss, sinus pain and sore throat.   ?Eyes:  Negative for blurred vision and pain.  ?Respiratory:  Negative for cough, sputum production, shortness of breath and wheezing.   ?Cardiovascular:  Negative for chest pain and palpitations.  ?Gastrointestinal:  Negative for blood in  stool, constipation, diarrhea, nausea and vomiting.  ?Genitourinary:  Positive for frequency and urgency. Negative for dysuria and hematuria.  ?     (+) urinary incontinence  ?Musculoskeletal:  Negative for back p

## 2021-12-15 NOTE — Assessment & Plan Note (Signed)
Also bph ?Pt was unable to be examined today due to him taking a muscle relaxer and he could barely keep his eyes open sitting or standing  ?Check psa  ?Could not get a urine -- pt unable to void  ?

## 2021-12-15 NOTE — Assessment & Plan Note (Signed)
F/u endo  

## 2021-12-15 NOTE — Patient Instructions (Signed)

## 2021-12-15 NOTE — Patient Instructions (Addendum)
Mr. Hoose,  ?It was a pleasure speaking with you today.  ?I have attached a summary of our visit today and information about your health goals.  ?(See Below)  ? ?If you have any questions or concerns, please feel free to contact me either at the phone number below or with a MyChart message.  ? ?Keep up the good work! ? ?Cherre Robins, PharmD ?Clinical Pharmacist ?[facility] ?[place] ?[phone] (direct line)  ?[phone] (main office number) ? ? ?Diabetes: ?Lab Results  ?Component Value Date  ? HGBA1C 8.5 (H) 09/29/2021  ? ?Not at goal but improved from 1 year ago - (goal A1c <7%) ?Much improved BG since started using Libre and taking insuiln regularly ?Current treatment: ?Metformin XR '500mg'$  - 2 Tablets with Breakfast and 2 tablets with Supper  ?Tresiba Flex Pen - inject 30 units once a day ?Novolog Flex Pen - inject 20 units with breakfast, 24 units with lunch and 24 units with supper.  ?Use the following correction scale for NovoLog only before meals.    ?Novolog correctional insulin: ADD extra units on insulin to your meal-time novolog dose if your blood sugars are higher than 150:  ?    ?Blood sugar before meal Number of units to inject  ?Less than 150 0 unit  ?151 -  170 1 units  ?171 -  190 2 units  ?191 -  210 3 units  ?211 -  230 4 units  ?231 -  250 5 units  ?251 -  270 6 units  ?271 -  290 7 units  ?291 -  310 8 units  ?311 - 330 9 units   ? ?Interventions / Recommendations ?Recommended continue to take medications as directed by Dr [name] ?Continue to check BG frequently during day with [facility]. ?Interventions / Recommendations ?Recommended increase Tresiba to 32 units daily for 2 to 3 days, if fasting blood glucose still > 120, then increase to 34 units.  ?Recommended contact Dr [name] to let her know about elevated blood glucose.  ?Continue to check BG frequently during day with [facility] ? ? ?Hypertension / swelling : ?BP Readings from Last 3  Encounters:  ?12/15/21 108/60  ?11/15/21 98/60  ?11/13/21 122/80  ? ?Blood pressure not consistently at goal - BP goal <140/90 ?Current treatment: ?Amlodipine '10mg'$  daily  ?Hydralazine '50mg'$  every 8 hours ?Losartan '100mg'$  daily  ?Furosemide '40mg'$  daily  ?Interventions:  ?Counseled on importance of getting blood pressure to goal to prevent strokes and kidney damage ?Start checking blood pressure at home 2 to 3 times per week. Record for future appointments.  ? ? ?Hyperlipidemia / Heart Disease: ?Lipid Panel  ?Lab Results  ?Component Value Date  ? CHOL 140 05/18/2021  ? HDL 56.60 05/18/2021  ? [place] 63 05/18/2021  ? LDLDIRECT 124.0 05/02/2017  ? TRIG 104.0 05/18/2021  ? CHOLHDL 2 05/18/2021  ? ?Controlled - LDL goal <70 ?Current treatment: ?Pravastatin '40mg'$  at bedtime ?Interventions:  ?Counseled on LDL goals ?Recommended continue current regimen for cholesterol ? ?Chronic Pain / Leg Weakness: ?Currently not controlled ?Patient has had several ED visits for pain and weakness in the last month. Has appointment with pain management 02/16/2021. ?Current treatment:  ?Tramadol '50mg'$  - take 1 or 2 tablets up to every 8 hours as needed ?Methocarbamol '500mg'$  - take 1 tablet 4 times a day as needed for muscle spasms ?Voltaren Gel 1% -  apply to area of pain up to 4 times a day as needed ?  Gabapentin '300mg'$  - Take 2 capsules / tablets 3 times a day ?Duloxetine '60mg'$  at night ?Interventions:  ?Continue current regimen for pain  ?Encouraged patient to follow up with neurology and pain management ?Recommended change duloxetine to take in morning to see if change helps with sleep  ? ?Medication Management:  ?Reviewed refill history.  ?Current Pharmacy[facility] ?Interventions:   ?Will continue to follow adherence and assist patient with refills.   ?Assisted with mail order refills ? ?Patient Goals/Self-Care Activities ?Over the next 90 days, patient will:  ?take medications as prescribed ?focus on medication adherence by  utilizing pharmacy service that delivers medications to his home, ?check glucose before meals and 2 hour after meals using CGM, document, and provide at future appointments ?Check blood pressure 2 to 3 times per week, record for future appointments ? ? ?Patient verbalizes understanding of instructions and care plan provided today and agrees to view in [place]. Active MyChart status confirmed with patient.    ?

## 2021-12-15 NOTE — Assessment & Plan Note (Signed)
Refer to podiatry

## 2021-12-16 ENCOUNTER — Encounter: Payer: Self-pay | Admitting: Family Medicine

## 2021-12-16 LAB — CBC WITH DIFFERENTIAL/PLATELET
Absolute Monocytes: 702 cells/uL (ref 200–950)
Basophils Absolute: 91 cells/uL (ref 0–200)
Basophils Relative: 1.4 %
Eosinophils Absolute: 410 cells/uL (ref 15–500)
Eosinophils Relative: 6.3 %
HCT: 41.1 % (ref 38.5–50.0)
Hemoglobin: 13.8 g/dL (ref 13.2–17.1)
Lymphs Abs: 1632 cells/uL (ref 850–3900)
MCH: 29 pg (ref 27.0–33.0)
MCHC: 33.6 g/dL (ref 32.0–36.0)
MCV: 86.3 fL (ref 80.0–100.0)
MPV: 9.3 fL (ref 7.5–12.5)
Monocytes Relative: 10.8 %
Neutro Abs: 3666 cells/uL (ref 1500–7800)
Neutrophils Relative %: 56.4 %
Platelets: 338 10*3/uL (ref 140–400)
RBC: 4.76 10*6/uL (ref 4.20–5.80)
RDW: 12.2 % (ref 11.0–15.0)
Total Lymphocyte: 25.1 %
WBC: 6.5 10*3/uL (ref 3.8–10.8)

## 2021-12-16 LAB — COMPREHENSIVE METABOLIC PANEL
AG Ratio: 1.3 (calc) (ref 1.0–2.5)
ALT: 18 U/L (ref 9–46)
AST: 11 U/L (ref 10–35)
Albumin: 4 g/dL (ref 3.6–5.1)
Alkaline phosphatase (APISO): 90 U/L (ref 35–144)
BUN/Creatinine Ratio: 24 (calc) — ABNORMAL HIGH (ref 6–22)
BUN: 31 mg/dL — ABNORMAL HIGH (ref 7–25)
CO2: 19 mmol/L — ABNORMAL LOW (ref 20–32)
Calcium: 9.5 mg/dL (ref 8.6–10.3)
Chloride: 103 mmol/L (ref 98–110)
Creat: 1.31 mg/dL — ABNORMAL HIGH (ref 0.70–1.30)
Globulin: 3 g/dL (calc) (ref 1.9–3.7)
Glucose, Bld: 258 mg/dL — ABNORMAL HIGH (ref 65–99)
Potassium: 4.4 mmol/L (ref 3.5–5.3)
Sodium: 137 mmol/L (ref 135–146)
Total Bilirubin: 0.2 mg/dL (ref 0.2–1.2)
Total Protein: 7 g/dL (ref 6.1–8.1)

## 2021-12-16 LAB — HEMOGLOBIN A1C
Hgb A1c MFr Bld: 9.8 % of total Hgb — ABNORMAL HIGH (ref <5.7)
Mean Plasma Glucose: 235 mg/dL
eAG (mmol/L): 13 mmol/L

## 2021-12-16 LAB — LIPID PANEL
Cholesterol: 150 mg/dL (ref <200)
HDL: 56 mg/dL (ref >=40)
LDL Cholesterol (Calc): 68 mg/dL (calc)
Non-HDL Cholesterol (Calc): 94 mg/dL (calc) (ref <130)
Total CHOL/HDL Ratio: 2.7 (calc) (ref <5.0)
Triglycerides: 179 mg/dL — ABNORMAL HIGH (ref <150)

## 2021-12-16 LAB — PSA: PSA: 1.66 ng/mL (ref <=4.00)

## 2021-12-17 ENCOUNTER — Encounter: Payer: Self-pay | Admitting: Family Medicine

## 2021-12-17 DIAGNOSIS — E1159 Type 2 diabetes mellitus with other circulatory complications: Secondary | ICD-10-CM

## 2021-12-17 DIAGNOSIS — I119 Hypertensive heart disease without heart failure: Secondary | ICD-10-CM | POA: Diagnosis not present

## 2021-12-17 DIAGNOSIS — E785 Hyperlipidemia, unspecified: Secondary | ICD-10-CM

## 2021-12-17 DIAGNOSIS — Z794 Long term (current) use of insulin: Secondary | ICD-10-CM

## 2021-12-17 DIAGNOSIS — I519 Heart disease, unspecified: Secondary | ICD-10-CM | POA: Diagnosis not present

## 2021-12-18 ENCOUNTER — Telehealth: Payer: Self-pay | Admitting: Family Medicine

## 2021-12-18 ENCOUNTER — Ambulatory Visit (INDEPENDENT_AMBULATORY_CARE_PROVIDER_SITE_OTHER): Payer: Medicare HMO

## 2021-12-18 DIAGNOSIS — Z0000 Encounter for general adult medical examination without abnormal findings: Secondary | ICD-10-CM | POA: Diagnosis not present

## 2021-12-18 NOTE — Progress Notes (Signed)
? ?Subjective:  ? Tyler Brewer is a 61 y.o. male who presents for an Initial Medicare Annual Wellness Visit. ? ?I connected with  [name] on 12/18/21 by a audio enabled telemedicine application and verified that I am speaking with the correct person using two identifiers. ? ?Patient Location: Home ? ?Provider Location: Office/Clinic ? ?I discussed the limitations of evaluation and management by telemedicine. The patient expressed understanding and agreed to proceed.  ? ?Review of Systems    ? ?Cardiac Risk Factors include: advanced age (>10mn, >>18women);diabetes mellitus;hypertension;dyslipidemia ? ?   ?Objective:  ?  ?Today's Vitals  ? 12/18/21 1101  ?PainSc: 5   ? ?There is no height or weight on file to calculate BMI. ? ? ?  12/18/2021  ? 10:59 AM 10/25/2021  ?  8:14 AM 10/09/2021  ?  9:05 AM 09/28/2021  ? 12:03 PM 08/30/2021  ?  9:26 AM 07/21/2021  ?  9:10 AM 05/12/2021  ?  8:52 PM  ?Advanced Directives  ?Does Patient Have a Medical Advance Directive? No No No No No No No  ?Would patient like information on creating a medical advance directive? No - Patient declined No - Patient declined       ? ? ?Current Medications (verified) ?Outpatient Encounter Medications as of 12/18/2021  ?Medication Sig  ? acetaminophen (TYLENOL) 500 MG tablet Take 500-1,000 mg by mouth every 6 (six) hours as needed for mild pain.  ? AMBULATORY NON FORMULARY MEDICATION Medication Name: MAudelia HivesWheelchair  ? amLODipine (NORVASC) 10 MG tablet Take 1 tablet (10 mg total) by mouth daily.  ? aspirin EC 81 MG tablet Take 1 tablet (81 mg total) by mouth daily. Swallow whole.  ? calcium carbonate (TUMS - DOSED IN MG ELEMENTAL CALCIUM) 500 MG chewable tablet Chew 1 tablet (200 mg of elemental calcium total) by mouth every 6 (six) hours as needed for indigestion or heartburn.  ? Cyanocobalamin (VITAMIN B-12 PO) Take 1 tablet by mouth daily.  ? diclofenac Sodium (VOLTAREN) 1 % GEL Apply 4 g topically 4 (four) times daily.  ? DULoxetine (CYMBALTA)  60 MG capsule Take 60 mg by mouth daily.  ? furosemide (LASIX) 20 MG tablet Take 1 tablet (20 mg total) by mouth 2 (two) times daily.  ? gabapentin (NEURONTIN) 300 MG capsule Take 2 capsules (600 mg total) by mouth 3 (three) times daily. (Patient taking differently: Take 1,200 mg by mouth 3 (three) times daily.)  ? hydrALAZINE (APRESOLINE) 50 MG tablet TAKE 1 TABLET(50 MG) BY MOUTH EVERY 8 HOURS  ? hydrOXYzine (ATARAX) 25 MG tablet Take 1 tablet (25 mg total) by mouth every 6 (six) hours as needed for anxiety.  ? immune globulin, human, (GAMMAGARD S/D) 5 g injection Inject into the vein every 21 ( twenty-one) days. Patient not sure of dose  ? insulin aspart (NOVOLOG FLEXPEN) 100 UNIT/ML FlexPen Max daily 80 units per scale (Patient taking differently: Inject 0-80 Units into the skin See admin instructions. Max daily 80 units per scale)  ? insulin degludec (TRESIBA FLEXTOUCH) 100 UNIT/ML FlexTouch Pen Inject 30 Units into the skin daily.  ? Insulin Pen Needle 31G X 5 MM MISC 1 Device by Does not apply route in the morning, at noon, in the evening, and at bedtime.  ? lidocaine (LIDODERM) 5 % Place 1 patch onto the skin daily. Remove & Discard patch within 12 hours or as directed by MD  ? losartan (COZAAR) 100 MG tablet TAKE ONE TABLET BY MOUTH DAILY  ?  MAGNESIUM PO Take 1 tablet by mouth daily.  ? meloxicam (MOBIC) 15 MG tablet Take 15 mg by mouth daily.  ? metFORMIN (GLUCOPHAGE-XR) 500 MG 24 hr tablet TAKE 2 TABLETS IN THE      MORNING AND AT BEDTIME (Patient taking differently: Take 1,000 mg by mouth 2 (two) times daily after a meal.)  ? methocarbamol (ROBAXIN) 500 MG tablet Take 500 mg by mouth 4 (four) times daily as needed for muscle spasms.  ? mirabegron ER (MYRBETRIQ) 50 MG TB24 tablet Take 1 tablet (50 mg total) by mouth daily.  ? Multiple Vitamin (MULTIVITAMIN) tablet Take 1 tablet by mouth daily.  ? nitroGLYCERIN (NITROSTAT) 0.4 MG SL tablet Place 1 tablet (0.4 mg total) under the tongue every 5 (five)  minutes as needed for chest pain.  ? NONFORMULARY OR COMPOUNDED ITEM Power wheelchair  ? NONFORMULARY OR COMPOUNDED ITEM Manuel wheelchair  ? pantoprazole (PROTONIX) 20 MG tablet Take 1 tablet (20 mg total) by mouth daily.  ? polyethylene glycol (MIRALAX / GLYCOLAX) 17 g packet Take 17 g by mouth daily as needed for mild constipation.  ? pravastatin (PRAVACHOL) 40 MG tablet TAKE 1 TABLET DAILY  ? tamsulosin (FLOMAX) 0.4 MG CAPS capsule TAKE ONE CAPSULE BY MOUTH ONE TIME DAILY  ? traMADol (ULTRAM) 50 MG tablet TAKE 2 TABLETS BY MOUTH EVERY 6 HOURS AS NEEDED  ? traMADol (ULTRAM) 50 MG tablet 2 po q6 h prn  ? sucralfate (CARAFATE) 1 g tablet Take 1 tablet (1 g total) by mouth 4 (four) times daily -  with meals and at bedtime for 7 days.  ? ?No facility-administered encounter medications on file as of 12/18/2021.  ? ? ?Allergies (verified) ?Patient has no known allergies.  ? ?History: ?Past Medical History:  ?Diagnosis Date  ? Arthritis   ? CAD (coronary artery disease)  cardiac cath with moderate disease in a septal branch of the ramus intermedius 04/01/2012  ? CIDP (chronic inflammatory demyelinating polyneuropathy) (HCC)   ? Depression   ? Diabetes mellitus   ? poorly controlled by his report  ? History of narcotic addiction ([place])   ? past history of back pain  ? Hypercholesteremia   ? Hypertension   ? IBS (irritable bowel syndrome)   ? 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 Neuropathy   ? Obesity   ? Max weight was 390  ? OSA on CPAP   ? Panic attacks   ? Testosterone deficiency   ? Vertigo   ? ?Past Surgical History:  ?Procedure Laterality Date  ? CARDIAC CATHETERIZATION    ? IR FLUORO GUIDE CV LINE RIGHT  11/10/2020  ? IR US GUIDE VASC ACCESS RIGHT  11/10/2020  ? LEFT HEART CATHETERIZATION WITH CORONARY ANGIOGRAM N/A 03/31/2012  ? Procedure: LEFT HEART CATHETERIZATION WITH CORONARY ANGIOGRAM;  Surgeon: Leonie Man, MD;  Location: [facility] CATH LAB;  Service: Cardiovascular;  Laterality: N/A;  ? ?Family History  ?Problem  Relation Age of Onset  ? Diabetes type II Father   ? Hypertension Father   ? Pancreatic disease Father 31  ?     Deceased  ? Healthy Mother   ? Healthy Sister   ? Healthy Son   ? Healthy Daughter   ? Parkinson's disease Maternal Grandmother   ? ?Social History  ? ?Socioeconomic History  ? Marital status: Single  ?  Spouse name: Not on file  ? Number of children: 2  ? Years of education: Not on file  ? Highest education level: Not  on file  ?Occupational History  ? Not on file  ?Tobacco Use  ? Smoking status: Former  ?  Packs/day: 0.50  ?  Types: Cigarettes  ?  Quit date: 04/27/2021  ?  Years since quitting: 0.6  ? Smokeless tobacco: Never  ?Vaping Use  ? Vaping Use: Never used  ?Substance and Sexual Activity  ? Alcohol use: No  ? Drug use: Not Currently  ?  Types: Methamphetamines  ? Sexual activity: Yes  ?Other Topics Concern  ? Not on file  ?Social History Narrative  ?   Has 2 grown children.    ? Works as a Barrister's clerk.  Education: Oceanographer.  ? Right Handed  ? Drinks Caffeine   ? Mother recently moved and sold her house-- pt is living with a friend in a trailer   ? ?Social Determinants of Health  ? ?Financial Resource Strain: Low Risk   ? Difficulty of Paying Living Expenses: Not very hard  ?Food Insecurity: Not on file  ?Transportation Needs: Unmet Transportation Needs  ? Lack of Transportation (Medical): Yes  ? Lack of Transportation (Non-Medical): Yes  ?Physical Activity: Insufficiently Active  ? Days of Exercise per Week: 2 days  ? Minutes of Exercise per Session: 30 min  ?Stress: Not on file  ?Social Connections: Not on file  ? ? ?Tobacco Counseling ?Counseling given: Not Answered ? ? ?Clinical Intake: ? ?Pre-visit preparation completed: Yes ? ?Pain : 0-10 ?Pain Score: 5  ?Pain Location: Leg ?Pain Orientation: Left, Right ?Pain Descriptors / Indicators: Aching, Dull, Sore ?Pain Onset: More than a month ago ?Pain Frequency: Occasional ? ?  ? ?Nutritional Risks: Unintentional weight gain ?Diabetes: Yes ?CBG  done?: No ?Did pt. bring in CBG monitor from home?: No ? ?How often do you need to have someone help you when you read instructions, pamphlets, or other written materials from your doctor or pharmacy?: 1 - Never

## 2021-12-18 NOTE — Telephone Encounter (Signed)
Pt rx Mirabegron ER at last visit. Please advise ?

## 2021-12-18 NOTE — Telephone Encounter (Signed)
Left verbal on VM ?

## 2021-12-18 NOTE — Patient Instructions (Signed)
Tyler Brewer , ?Thank you for taking time to come for your Medicare Wellness Visit. I appreciate your ongoing commitment to your health goals. Please review the following plan we discussed and let me know if I can assist you in the future.  ? ?Screening recommendations/referrals: ?Colonoscopy: declined ?Recommended yearly ophthalmology/optometry visit for glaucoma screening and checkup ?Recommended yearly dental visit for hygiene and checkup ? ?Vaccinations: ?Influenza vaccine: declined ?Pneumococcal vaccine: N/A ?Tdap vaccine: up to date ?Shingles vaccine: N/A   ?Covid-19: declined ? ?Advanced directives: no ? ?Conditions/risks identified: see problem list  ? ?Next appointment: Follow up in one year for your annual wellness visit  ? ?Preventive Care 40-64 Years, Male ?Preventive care refers to lifestyle choices and visits with your health care provider that can promote health and wellness. ?What does preventive care include? ?A yearly physical exam. This is also called an annual well check. ?Dental exams once or twice a year. ?Routine eye exams. Ask your health care provider how often you should have your eyes checked. ?Personal lifestyle choices, including: ?Daily care of your teeth and gums. ?Regular physical activity. ?Eating a healthy diet. ?Avoiding tobacco and drug use. ?Limiting alcohol use. ?Practicing safe sex. ?Taking low-dose aspirin every day starting at age 59. ?What happens during an annual well check? ?The services and screenings done by your health care provider during your annual well check will depend on your age, overall health, lifestyle risk factors, and family history of disease. ?Counseling  ?Your health care provider may ask you questions about your: ?Alcohol use. ?Tobacco use. ?Drug use. ?Emotional well-being. ?Home and relationship well-being. ?Sexual activity. ?Eating habits. ?Work and work Statistician. ?Screening  ?You may have the following tests or measurements: ?Height, weight, and  BMI. ?Blood pressure. ?Lipid and cholesterol levels. These may be checked every 5 years, or more frequently if you are over 55 years old. ?Skin check. ?Lung cancer screening. You may have this screening every year starting at age 29 if you have a 30-pack-year history of smoking and currently smoke or have quit within the past 15 years. ?Fecal occult blood test (FOBT) of the stool. You may have this test every year starting at age 63. ?Flexible sigmoidoscopy or colonoscopy. You may have a sigmoidoscopy every 5 years or a colonoscopy every 10 years starting at age 19. ?Prostate cancer screening. Recommendations will vary depending on your family history and other risks. ?Hepatitis C blood test. ?Hepatitis B blood test. ?Sexually transmitted disease (STD) testing. ?Diabetes screening. This is done by checking your blood sugar (glucose) after you have not eaten for a while (fasting). You may have this done every 1-3 years. ?Discuss your test results, treatment options, and if necessary, the need for more tests with your health care provider. ?Vaccines  ?Your health care provider may recommend certain vaccines, such as: ?Influenza vaccine. This is recommended every year. ?Tetanus, diphtheria, and acellular pertussis (Tdap, Td) vaccine. You may need a Td booster every 10 years. ?Zoster vaccine. You may need this after age 36. ?Pneumococcal 13-valent conjugate (PCV13) vaccine. You may need this if you have certain conditions and have not been vaccinated. ?Pneumococcal polysaccharide (PPSV23) vaccine. You may need one or two doses if you smoke cigarettes or if you have certain conditions. ?Talk to your health care provider about which screenings and vaccines you need and how often you need them. ?This information is not intended to replace advice given to you by your health care provider. Make sure you discuss any questions you have with  your health care provider. ?Document Released: 09/02/2015 Document Revised: 04/25/2016  Document Reviewed: 06/07/2015 ?Elsevier Interactive Patient Education ? 2017 [place]. ? ?Fall Prevention in the Home ?Falls can cause injuries. They can happen to people of all ages. There are many things you can do to make your home safe and to help prevent falls. ?What can I do on the outside of my home? ?Regularly fix the edges of walkways and driveways and fix any cracks. ?Remove anything that might make you trip as you walk through a door, such as a raised step or threshold. ?Trim any bushes or trees on the path to your home. ?Use bright outdoor lighting. ?Clear any walking paths of anything that might make someone trip, such as rocks or tools. ?Regularly check to see if handrails are loose or broken. Make sure that both sides of any steps have handrails. ?Any raised decks and porches should have guardrails on the edges. ?Have any leaves, snow, or ice cleared regularly. ?Use sand or salt on walking paths during winter. ?Clean up any spills in your garage right away. This includes oil or grease spills. ?What can I do in the bathroom? ?Use night lights. ?Install grab bars by the toilet and in the tub and shower. Do not use towel bars as grab bars. ?Use non-skid mats or decals in the tub or shower. ?If you need to sit down in the shower, use a plastic, non-slip stool. ?Keep the floor dry. Clean up any water that spills on the floor as soon as it happens. ?Remove soap buildup in the tub or shower regularly. ?Attach bath mats securely with double-sided non-slip rug tape. ?Do not have throw rugs and other things on the floor that can make you trip. ?What can I do in the bedroom? ?Use night lights. ?Make sure that you have a light by your bed that is easy to reach. ?Do not use any sheets or blankets that are too big for your bed. They should not hang down onto the floor. ?Have a firm chair that has side arms. You can use this for support while you get dressed. ?Do not have throw rugs and other things on the floor  that can make you trip. ?What can I do in the kitchen? ?Clean up any spills right away. ?Avoid walking on wet floors. ?Keep items that you use a lot in easy-to-reach places. ?If you need to reach something above you, use a strong step stool that has a grab bar. ?Keep electrical cords out of the way. ?Do not use floor polish or wax that makes floors slippery. If you must use wax, use non-skid floor wax. ?Do not have throw rugs and other things on the floor that can make you trip. ?What can I do with my stairs? ?Do not leave any items on the stairs. ?Make sure that there are handrails on both sides of the stairs and use them. Fix handrails that are broken or loose. Make sure that handrails are as long as the stairways. ?Check any carpeting to make sure that it is firmly attached to the stairs. Fix any carpet that is loose or worn. ?Avoid having throw rugs at the top or bottom of the stairs. If you do have throw rugs, attach them to the floor with carpet tape. ?Make sure that you have a light switch at the top of the stairs and the bottom of the stairs. If you do not have them, ask someone to add them for you. ?What else  can I do to help prevent falls? ?Wear shoes that: ?Do not have high heels. ?Have rubber bottoms. ?Are comfortable and fit you well. ?Are closed at the toe. Do not wear sandals. ?If you use a stepladder: ?Make sure that it is fully opened. Do not climb a closed stepladder. ?Make sure that both sides of the stepladder are locked into place. ?Ask someone to hold it for you, if possible. ?Clearly mark and make sure that you can see: ?Any grab bars or handrails. ?First and last steps. ?Where the edge of each step is. ?Use tools that help you move around (mobility aids) if they are needed. These include: ?Canes. ?Walkers. ?Scooters. ?Crutches. ?Turn on the lights when you go into a dark area. Replace any light bulbs as soon as they burn out. ?Set up your furniture so you have a clear path. Avoid moving your  furniture around. ?If any of your floors are uneven, fix them. ?If there are any pets around you, be aware of where they are. ?Review your medicines with your doctor. Some medicines can make you feel di

## 2021-12-18 NOTE — Telephone Encounter (Signed)
[facility]) called requesting permission from Dr. Etter Sjogren to increase the pt's PT frequesncy to 2 w 4. Please Advise. ?

## 2021-12-19 ENCOUNTER — Telehealth: Payer: Self-pay | Admitting: Pharmacist

## 2021-12-19 DIAGNOSIS — G6181 Chronic inflammatory demyelinating polyneuritis: Secondary | ICD-10-CM | POA: Diagnosis not present

## 2021-12-20 ENCOUNTER — Other Ambulatory Visit: Payer: Self-pay

## 2021-12-20 DIAGNOSIS — R972 Elevated prostate specific antigen [PSA]: Secondary | ICD-10-CM

## 2021-12-20 DIAGNOSIS — G6181 Chronic inflammatory demyelinating polyneuritis: Secondary | ICD-10-CM | POA: Diagnosis not present

## 2021-12-20 DIAGNOSIS — N3941 Urge incontinence: Secondary | ICD-10-CM

## 2021-12-20 MED ORDER — TOLTERODINE TARTRATE ER 4 MG PO CP24: 4.0000 mg | ORAL_CAPSULE | Freq: Every day | ORAL | 2 refills | Status: AC

## 2021-12-20 NOTE — Telephone Encounter (Signed)
Conference call with patient and [facility] / [employer] to see if [facility] was able to process hydroxyzine with his secondary coverage thru the Patient Assistance Fund ([place] - ID [id number] / Grp [id number] / PCN: [id number] / BIN [id number]). Information was provided to [facility] last week. Today they report that they are not able to process additional coverage due to it being an E-Voucher program.   ?Patient has requested that no prescriptions be sent to [facility] going forward. He does not need hydroxyzine at this time. He does need hydralazine, pantoprazole, metformin, amlodipine, tamsulosin and pravastatin. ? ?Researched his [employer] plan. The following pharmacist are in preferred: [facility], [facility], [facility], [facility] [place], [facility] and [place] the meds he needs refills would be $0.  ? ?He would be at a network pharmacy like [place], [place], [place], [facility] or [facility] but cost is $9 per 30 days and $27 per 90 days.  ? ?Patient is considering using [facility]. He wants to think about it and will get back to me with his decision.  ?

## 2021-12-21 DIAGNOSIS — M1990 Unspecified osteoarthritis, unspecified site: Secondary | ICD-10-CM | POA: Diagnosis not present

## 2021-12-21 DIAGNOSIS — J849 Interstitial pulmonary disease, unspecified: Secondary | ICD-10-CM | POA: Diagnosis not present

## 2021-12-21 DIAGNOSIS — Z794 Long term (current) use of insulin: Secondary | ICD-10-CM | POA: Diagnosis not present

## 2021-12-21 DIAGNOSIS — F32A Depression, unspecified: Secondary | ICD-10-CM | POA: Diagnosis not present

## 2021-12-21 DIAGNOSIS — E7800 Pure hypercholesterolemia, unspecified: Secondary | ICD-10-CM | POA: Diagnosis not present

## 2021-12-21 DIAGNOSIS — I10 Essential (primary) hypertension: Secondary | ICD-10-CM | POA: Diagnosis not present

## 2021-12-21 DIAGNOSIS — G6181 Chronic inflammatory demyelinating polyneuritis: Secondary | ICD-10-CM | POA: Diagnosis not present

## 2021-12-21 DIAGNOSIS — N401 Enlarged prostate with lower urinary tract symptoms: Secondary | ICD-10-CM | POA: Diagnosis not present

## 2021-12-21 DIAGNOSIS — F410 Panic disorder [episodic paroxysmal anxiety] without agoraphobia: Secondary | ICD-10-CM | POA: Diagnosis not present

## 2021-12-21 DIAGNOSIS — Z87891 Personal history of nicotine dependence: Secondary | ICD-10-CM | POA: Diagnosis not present

## 2021-12-21 DIAGNOSIS — Z7984 Long term (current) use of oral hypoglycemic drugs: Secondary | ICD-10-CM | POA: Diagnosis not present

## 2021-12-21 DIAGNOSIS — K589 Irritable bowel syndrome without diarrhea: Secondary | ICD-10-CM | POA: Diagnosis not present

## 2021-12-21 DIAGNOSIS — G4733 Obstructive sleep apnea (adult) (pediatric): Secondary | ICD-10-CM | POA: Diagnosis not present

## 2021-12-21 DIAGNOSIS — N39498 Other specified urinary incontinence: Secondary | ICD-10-CM | POA: Diagnosis not present

## 2021-12-21 DIAGNOSIS — Z6841 Body Mass Index (BMI) 40.0 and over, adult: Secondary | ICD-10-CM | POA: Diagnosis not present

## 2021-12-21 DIAGNOSIS — I2510 Atherosclerotic heart disease of native coronary artery without angina pectoris: Secondary | ICD-10-CM | POA: Diagnosis not present

## 2021-12-21 DIAGNOSIS — F988 Other specified behavioral and emotional disorders with onset usually occurring in childhood and adolescence: Secondary | ICD-10-CM | POA: Diagnosis not present

## 2021-12-21 DIAGNOSIS — E1136 Type 2 diabetes mellitus with diabetic cataract: Secondary | ICD-10-CM | POA: Diagnosis not present

## 2021-12-21 DIAGNOSIS — E1121 Type 2 diabetes mellitus with diabetic nephropathy: Secondary | ICD-10-CM | POA: Diagnosis not present

## 2021-12-21 DIAGNOSIS — E1142 Type 2 diabetes mellitus with diabetic polyneuropathy: Secondary | ICD-10-CM | POA: Diagnosis not present

## 2021-12-21 DIAGNOSIS — M5441 Lumbago with sciatica, right side: Secondary | ICD-10-CM | POA: Diagnosis not present

## 2021-12-22 ENCOUNTER — Other Ambulatory Visit: Payer: Self-pay | Admitting: Family Medicine

## 2021-12-22 ENCOUNTER — Telehealth: Payer: Self-pay | Admitting: Family Medicine

## 2021-12-22 NOTE — Telephone Encounter (Signed)
HH advised pt missed PT with infusions on 5/2.  ?

## 2021-12-26 DIAGNOSIS — E1136 Type 2 diabetes mellitus with diabetic cataract: Secondary | ICD-10-CM | POA: Diagnosis not present

## 2021-12-26 DIAGNOSIS — E7800 Pure hypercholesterolemia, unspecified: Secondary | ICD-10-CM | POA: Diagnosis not present

## 2021-12-26 DIAGNOSIS — F410 Panic disorder [episodic paroxysmal anxiety] without agoraphobia: Secondary | ICD-10-CM | POA: Diagnosis not present

## 2021-12-26 DIAGNOSIS — Z794 Long term (current) use of insulin: Secondary | ICD-10-CM | POA: Diagnosis not present

## 2021-12-26 DIAGNOSIS — E1121 Type 2 diabetes mellitus with diabetic nephropathy: Secondary | ICD-10-CM | POA: Diagnosis not present

## 2021-12-26 DIAGNOSIS — F32A Depression, unspecified: Secondary | ICD-10-CM | POA: Diagnosis not present

## 2021-12-26 DIAGNOSIS — I10 Essential (primary) hypertension: Secondary | ICD-10-CM | POA: Diagnosis not present

## 2021-12-26 DIAGNOSIS — N39498 Other specified urinary incontinence: Secondary | ICD-10-CM | POA: Diagnosis not present

## 2021-12-26 DIAGNOSIS — I2510 Atherosclerotic heart disease of native coronary artery without angina pectoris: Secondary | ICD-10-CM | POA: Diagnosis not present

## 2021-12-26 DIAGNOSIS — F988 Other specified behavioral and emotional disorders with onset usually occurring in childhood and adolescence: Secondary | ICD-10-CM | POA: Diagnosis not present

## 2021-12-26 DIAGNOSIS — Z6841 Body Mass Index (BMI) 40.0 and over, adult: Secondary | ICD-10-CM | POA: Diagnosis not present

## 2021-12-26 DIAGNOSIS — Z87891 Personal history of nicotine dependence: Secondary | ICD-10-CM | POA: Diagnosis not present

## 2021-12-26 DIAGNOSIS — M5441 Lumbago with sciatica, right side: Secondary | ICD-10-CM | POA: Diagnosis not present

## 2021-12-26 DIAGNOSIS — Z7984 Long term (current) use of oral hypoglycemic drugs: Secondary | ICD-10-CM | POA: Diagnosis not present

## 2021-12-26 DIAGNOSIS — J849 Interstitial pulmonary disease, unspecified: Secondary | ICD-10-CM | POA: Diagnosis not present

## 2021-12-26 DIAGNOSIS — M1990 Unspecified osteoarthritis, unspecified site: Secondary | ICD-10-CM | POA: Diagnosis not present

## 2021-12-26 DIAGNOSIS — G4733 Obstructive sleep apnea (adult) (pediatric): Secondary | ICD-10-CM | POA: Diagnosis not present

## 2021-12-26 DIAGNOSIS — N401 Enlarged prostate with lower urinary tract symptoms: Secondary | ICD-10-CM | POA: Diagnosis not present

## 2021-12-26 DIAGNOSIS — G6181 Chronic inflammatory demyelinating polyneuritis: Secondary | ICD-10-CM | POA: Diagnosis not present

## 2021-12-26 DIAGNOSIS — K589 Irritable bowel syndrome without diarrhea: Secondary | ICD-10-CM | POA: Diagnosis not present

## 2021-12-26 DIAGNOSIS — E1142 Type 2 diabetes mellitus with diabetic polyneuropathy: Secondary | ICD-10-CM | POA: Diagnosis not present

## 2021-12-28 DIAGNOSIS — E1136 Type 2 diabetes mellitus with diabetic cataract: Secondary | ICD-10-CM | POA: Diagnosis not present

## 2021-12-28 DIAGNOSIS — M5441 Lumbago with sciatica, right side: Secondary | ICD-10-CM | POA: Diagnosis not present

## 2021-12-28 DIAGNOSIS — N401 Enlarged prostate with lower urinary tract symptoms: Secondary | ICD-10-CM | POA: Diagnosis not present

## 2021-12-28 DIAGNOSIS — I10 Essential (primary) hypertension: Secondary | ICD-10-CM | POA: Diagnosis not present

## 2021-12-28 DIAGNOSIS — F410 Panic disorder [episodic paroxysmal anxiety] without agoraphobia: Secondary | ICD-10-CM | POA: Diagnosis not present

## 2021-12-28 DIAGNOSIS — Z87891 Personal history of nicotine dependence: Secondary | ICD-10-CM | POA: Diagnosis not present

## 2021-12-28 DIAGNOSIS — M1990 Unspecified osteoarthritis, unspecified site: Secondary | ICD-10-CM | POA: Diagnosis not present

## 2021-12-28 DIAGNOSIS — Z6841 Body Mass Index (BMI) 40.0 and over, adult: Secondary | ICD-10-CM | POA: Diagnosis not present

## 2021-12-28 DIAGNOSIS — Z794 Long term (current) use of insulin: Secondary | ICD-10-CM | POA: Diagnosis not present

## 2021-12-28 DIAGNOSIS — G4733 Obstructive sleep apnea (adult) (pediatric): Secondary | ICD-10-CM | POA: Diagnosis not present

## 2021-12-28 DIAGNOSIS — I2510 Atherosclerotic heart disease of native coronary artery without angina pectoris: Secondary | ICD-10-CM | POA: Diagnosis not present

## 2021-12-28 DIAGNOSIS — Z7984 Long term (current) use of oral hypoglycemic drugs: Secondary | ICD-10-CM | POA: Diagnosis not present

## 2021-12-28 DIAGNOSIS — F988 Other specified behavioral and emotional disorders with onset usually occurring in childhood and adolescence: Secondary | ICD-10-CM | POA: Diagnosis not present

## 2021-12-28 DIAGNOSIS — E7800 Pure hypercholesterolemia, unspecified: Secondary | ICD-10-CM | POA: Diagnosis not present

## 2021-12-28 DIAGNOSIS — F32A Depression, unspecified: Secondary | ICD-10-CM | POA: Diagnosis not present

## 2021-12-28 DIAGNOSIS — G6181 Chronic inflammatory demyelinating polyneuritis: Secondary | ICD-10-CM | POA: Diagnosis not present

## 2021-12-28 DIAGNOSIS — J849 Interstitial pulmonary disease, unspecified: Secondary | ICD-10-CM | POA: Diagnosis not present

## 2021-12-28 DIAGNOSIS — E1142 Type 2 diabetes mellitus with diabetic polyneuropathy: Secondary | ICD-10-CM | POA: Diagnosis not present

## 2021-12-28 DIAGNOSIS — N39498 Other specified urinary incontinence: Secondary | ICD-10-CM | POA: Diagnosis not present

## 2021-12-28 DIAGNOSIS — K589 Irritable bowel syndrome without diarrhea: Secondary | ICD-10-CM | POA: Diagnosis not present

## 2021-12-28 DIAGNOSIS — E1121 Type 2 diabetes mellitus with diabetic nephropathy: Secondary | ICD-10-CM | POA: Diagnosis not present

## 2021-12-31 DIAGNOSIS — E1165 Type 2 diabetes mellitus with hyperglycemia: Secondary | ICD-10-CM | POA: Diagnosis not present

## 2022-01-01 ENCOUNTER — Ambulatory Visit: Payer: Medicare HMO | Admitting: Podiatrist

## 2022-01-02 DIAGNOSIS — Z6841 Body Mass Index (BMI) 40.0 and over, adult: Secondary | ICD-10-CM | POA: Diagnosis not present

## 2022-01-02 DIAGNOSIS — Z7984 Long term (current) use of oral hypoglycemic drugs: Secondary | ICD-10-CM | POA: Diagnosis not present

## 2022-01-02 DIAGNOSIS — F410 Panic disorder [episodic paroxysmal anxiety] without agoraphobia: Secondary | ICD-10-CM | POA: Diagnosis not present

## 2022-01-02 DIAGNOSIS — E1142 Type 2 diabetes mellitus with diabetic polyneuropathy: Secondary | ICD-10-CM | POA: Diagnosis not present

## 2022-01-02 DIAGNOSIS — N401 Enlarged prostate with lower urinary tract symptoms: Secondary | ICD-10-CM | POA: Diagnosis not present

## 2022-01-02 DIAGNOSIS — I10 Essential (primary) hypertension: Secondary | ICD-10-CM | POA: Diagnosis not present

## 2022-01-02 DIAGNOSIS — E7800 Pure hypercholesterolemia, unspecified: Secondary | ICD-10-CM | POA: Diagnosis not present

## 2022-01-02 DIAGNOSIS — I2510 Atherosclerotic heart disease of native coronary artery without angina pectoris: Secondary | ICD-10-CM | POA: Diagnosis not present

## 2022-01-02 DIAGNOSIS — K589 Irritable bowel syndrome without diarrhea: Secondary | ICD-10-CM | POA: Diagnosis not present

## 2022-01-02 DIAGNOSIS — Z794 Long term (current) use of insulin: Secondary | ICD-10-CM | POA: Diagnosis not present

## 2022-01-02 DIAGNOSIS — E1136 Type 2 diabetes mellitus with diabetic cataract: Secondary | ICD-10-CM | POA: Diagnosis not present

## 2022-01-02 DIAGNOSIS — N39498 Other specified urinary incontinence: Secondary | ICD-10-CM | POA: Diagnosis not present

## 2022-01-02 DIAGNOSIS — G4733 Obstructive sleep apnea (adult) (pediatric): Secondary | ICD-10-CM | POA: Diagnosis not present

## 2022-01-02 DIAGNOSIS — E1121 Type 2 diabetes mellitus with diabetic nephropathy: Secondary | ICD-10-CM | POA: Diagnosis not present

## 2022-01-02 DIAGNOSIS — F988 Other specified behavioral and emotional disorders with onset usually occurring in childhood and adolescence: Secondary | ICD-10-CM | POA: Diagnosis not present

## 2022-01-02 DIAGNOSIS — M1990 Unspecified osteoarthritis, unspecified site: Secondary | ICD-10-CM | POA: Diagnosis not present

## 2022-01-02 DIAGNOSIS — M5441 Lumbago with sciatica, right side: Secondary | ICD-10-CM | POA: Diagnosis not present

## 2022-01-02 DIAGNOSIS — G6181 Chronic inflammatory demyelinating polyneuritis: Secondary | ICD-10-CM | POA: Diagnosis not present

## 2022-01-02 DIAGNOSIS — F32A Depression, unspecified: Secondary | ICD-10-CM | POA: Diagnosis not present

## 2022-01-02 DIAGNOSIS — J849 Interstitial pulmonary disease, unspecified: Secondary | ICD-10-CM | POA: Diagnosis not present

## 2022-01-02 DIAGNOSIS — Z87891 Personal history of nicotine dependence: Secondary | ICD-10-CM | POA: Diagnosis not present

## 2022-01-04 ENCOUNTER — Ambulatory Visit (INDEPENDENT_AMBULATORY_CARE_PROVIDER_SITE_OTHER): Payer: Medicare HMO

## 2022-01-04 DIAGNOSIS — G6181 Chronic inflammatory demyelinating polyneuritis: Secondary | ICD-10-CM | POA: Diagnosis not present

## 2022-01-04 DIAGNOSIS — F410 Panic disorder [episodic paroxysmal anxiety] without agoraphobia: Secondary | ICD-10-CM | POA: Diagnosis not present

## 2022-01-04 DIAGNOSIS — I10 Essential (primary) hypertension: Secondary | ICD-10-CM | POA: Diagnosis not present

## 2022-01-04 DIAGNOSIS — Z7984 Long term (current) use of oral hypoglycemic drugs: Secondary | ICD-10-CM | POA: Diagnosis not present

## 2022-01-04 DIAGNOSIS — E1136 Type 2 diabetes mellitus with diabetic cataract: Secondary | ICD-10-CM | POA: Diagnosis not present

## 2022-01-04 DIAGNOSIS — N401 Enlarged prostate with lower urinary tract symptoms: Secondary | ICD-10-CM | POA: Diagnosis not present

## 2022-01-04 DIAGNOSIS — K589 Irritable bowel syndrome without diarrhea: Secondary | ICD-10-CM | POA: Diagnosis not present

## 2022-01-04 DIAGNOSIS — J849 Interstitial pulmonary disease, unspecified: Secondary | ICD-10-CM | POA: Diagnosis not present

## 2022-01-04 DIAGNOSIS — E1165 Type 2 diabetes mellitus with hyperglycemia: Secondary | ICD-10-CM

## 2022-01-04 DIAGNOSIS — Z87891 Personal history of nicotine dependence: Secondary | ICD-10-CM | POA: Diagnosis not present

## 2022-01-04 DIAGNOSIS — F988 Other specified behavioral and emotional disorders with onset usually occurring in childhood and adolescence: Secondary | ICD-10-CM | POA: Diagnosis not present

## 2022-01-04 DIAGNOSIS — Z6841 Body Mass Index (BMI) 40.0 and over, adult: Secondary | ICD-10-CM | POA: Diagnosis not present

## 2022-01-04 DIAGNOSIS — F32A Depression, unspecified: Secondary | ICD-10-CM | POA: Diagnosis not present

## 2022-01-04 DIAGNOSIS — Z794 Long term (current) use of insulin: Secondary | ICD-10-CM | POA: Diagnosis not present

## 2022-01-04 DIAGNOSIS — G4733 Obstructive sleep apnea (adult) (pediatric): Secondary | ICD-10-CM | POA: Diagnosis not present

## 2022-01-04 DIAGNOSIS — E1142 Type 2 diabetes mellitus with diabetic polyneuropathy: Secondary | ICD-10-CM | POA: Diagnosis not present

## 2022-01-04 DIAGNOSIS — M5441 Lumbago with sciatica, right side: Secondary | ICD-10-CM | POA: Diagnosis not present

## 2022-01-04 DIAGNOSIS — M1990 Unspecified osteoarthritis, unspecified site: Secondary | ICD-10-CM | POA: Diagnosis not present

## 2022-01-04 DIAGNOSIS — N39498 Other specified urinary incontinence: Secondary | ICD-10-CM | POA: Diagnosis not present

## 2022-01-04 DIAGNOSIS — I2510 Atherosclerotic heart disease of native coronary artery without angina pectoris: Secondary | ICD-10-CM | POA: Diagnosis not present

## 2022-01-04 DIAGNOSIS — E7800 Pure hypercholesterolemia, unspecified: Secondary | ICD-10-CM | POA: Diagnosis not present

## 2022-01-04 DIAGNOSIS — E1121 Type 2 diabetes mellitus with diabetic nephropathy: Secondary | ICD-10-CM | POA: Diagnosis not present

## 2022-01-04 NOTE — Patient Instructions (Signed)
Visit Information  Thank you for taking time to visit with me today. Please don't hesitate to contact me if I can be of assistance to you before our next scheduled telephone appointment.  Following are the goals we discussed today:  Patient Goals/Self-Care Activities: Eat healthy meals: eat plenty of vegetables, fruits, lean meats; monitor your fat intake. Avoid saturated fats and transfats: limit sugars, rice, potatoes, pasta. Try substituting sugar free greek yogurt and apple slices in the place of chips/dip/ice cream. Take all medications as prescribed Attend all scheduled provider appointments Call provider office for new concerns or questions  Continue to maintain safety/fall precautions Continue to perform exercises recommended by your home health physical therapist Contact RNCM if you have any questions or concerns or community resource needs  Our next appointment is by telephone on 02/08/22 at 2:00 pm  Please call the care guide team at [phone] if you need to cancel or reschedule your appointment.   If you are experiencing a Mental Health or [place] or need someone to talk to, please call the Suicide and Crisis Lifeline: 988 call 1-800-273-TALK (toll free, 24 hour hotline)   Patient verbalizes understanding of instructions and care plan provided today and agrees to view in [place]. Active MyChart status and patient understanding of how to access instructions and care plan via MyChart confirmed with patient.     Thea Silversmith, RN, MSN, BSN, CCM Care Management Coordinator [facility] [phone]

## 2022-01-04 NOTE — Chronic Care Management (AMB) (Signed)
Chronic Care Management   CCM RN Visit Note  01/04/2022 Name: Merwin Breden MRN: [id number] DOB: [birth year]  Subjective: Dawid Dupriest is a 61 y.o. year old male who is a primary care patient of [name], DO. The care management team was consulted for assistance with disease management and care coordination needs.    Engaged with patient by telephone for follow up visit in response to provider referral for case management and/or care coordination services.   Consent to Services:  The patient was given information about Chronic Care Management services, agreed to services, and gave verbal consent prior to initiation of services.  Please see initial visit note for detailed documentation.   Patient agreed to services and verbal consent obtained.   Assessment: Review of patient past medical history, allergies, medications, health status, including review of consultants reports, laboratory and other test data, was performed as part of comprehensive evaluation and provision of chronic care management services.   SDOH (Social Determinants of Health) assessments and interventions performed:    CCM Care Plan  No Known Allergies  Outpatient Encounter Medications as of 01/04/2022  Medication Sig Note   acetaminophen (TYLENOL) 500 MG tablet Take 500-1,000 mg by mouth every 6 (six) hours as needed for mild pain.    amLODipine (NORVASC) 10 MG tablet Take 1 tablet (10 mg total) by mouth daily.    aspirin EC 81 MG tablet Take 1 tablet (81 mg total) by mouth daily. Swallow whole.    calcium carbonate (TUMS - DOSED IN MG ELEMENTAL CALCIUM) 500 MG chewable tablet Chew 1 tablet (200 mg of elemental calcium total) by mouth every 6 (six) hours as needed for indigestion or heartburn.    Cyanocobalamin (VITAMIN B-12 PO) Take 1 tablet by mouth daily.    diclofenac Sodium (VOLTAREN) 1 % GEL Apply 4 g topically 4 (four) times daily.    DULoxetine (CYMBALTA) 60 MG capsule Take 60 mg by mouth  daily.    furosemide (LASIX) 20 MG tablet Take 1 tablet (20 mg total) by mouth 2 (two) times daily.    gabapentin (NEURONTIN) 300 MG capsule Take 2 capsules (600 mg total) by mouth 3 (three) times daily. (Patient taking differently: Take 1,200 mg by mouth 3 (three) times daily.) 10/30/2021: Reports 4 capsules three times/day. Reports increased per neurologist.   hydrALAZINE (APRESOLINE) 50 MG tablet TAKE 1 TABLET(50 MG) BY MOUTH EVERY 8 HOURS    hydrOXYzine (ATARAX) 25 MG tablet Take 1 tablet (25 mg total) by mouth every 6 (six) hours as needed for anxiety.    insulin aspart (NOVOLOG FLEXPEN) 100 UNIT/ML FlexPen Max daily 80 units per scale (Patient taking differently: Inject 0-80 Units into the skin See admin instructions. Max daily 80 units per scale)    insulin degludec (TRESIBA FLEXTOUCH) 100 UNIT/ML FlexTouch Pen Inject 30 Units into the skin daily.    lidocaine (LIDODERM) 5 % Place 1 patch onto the skin daily. Remove & Discard patch within 12 hours or as directed by MD    losartan (COZAAR) 100 MG tablet TAKE ONE TABLET BY MOUTH DAILY    MAGNESIUM PO Take 1 tablet by mouth daily.    meloxicam (MOBIC) 15 MG tablet Take 15 mg by mouth daily.    metFORMIN (GLUCOPHAGE-XR) 500 MG 24 hr tablet TAKE 2 TABLETS IN THE      MORNING AND AT BEDTIME (Patient taking differently: Take 1,000 mg by mouth 2 (two) times daily after a meal.)    methocarbamol (ROBAXIN) 500 MG  tablet Take 500 mg by mouth 4 (four) times daily as needed for muscle spasms.    Multiple Vitamin (MULTIVITAMIN) tablet Take 1 tablet by mouth daily.    nitroGLYCERIN (NITROSTAT) 0.4 MG SL tablet Place 1 tablet (0.4 mg total) under the tongue every 5 (five) minutes as needed for chest pain.    pantoprazole (PROTONIX) 20 MG tablet Take 1 tablet (20 mg total) by mouth daily.    polyethylene glycol (MIRALAX / GLYCOLAX) 17 g packet Take 17 g by mouth daily as needed for mild constipation.    pravastatin (PRAVACHOL) 40 MG tablet TAKE 1 TABLET DAILY     tamsulosin (FLOMAX) 0.4 MG CAPS capsule TAKE ONE CAPSULE BY MOUTH ONE TIME DAILY    traMADol (ULTRAM) 50 MG tablet TAKE 2 TABLETS BY MOUTH EVERY 6 HOURS AS NEEDED    traMADol (ULTRAM) 50 MG tablet 2 po q6 h prn    AMBULATORY NON FORMULARY MEDICATION Medication Name: Audelia Hives Wheelchair    immune globulin, human, (GAMMAGARD S/D) 5 g injection Inject into the vein every 21 ( twenty-one) days. Patient not sure of dose 09/29/2021: Patient not sure of dose    Insulin Pen Needle 31G X 5 MM MISC 1 Device by Does not apply route in the morning, at noon, in the evening, and at bedtime.    NONFORMULARY OR COMPOUNDED ITEM Power wheelchair    NONFORMULARY OR COMPOUNDED ITEM Manuel wheelchair    sucralfate (CARAFATE) 1 g tablet Take 1 tablet (1 g total) by mouth 4 (four) times daily -  with meals and at bedtime for 7 days. 01/04/2022: Does not take   tolterodine (DETROL LA) 4 MG 24 hr capsule Take 1 capsule (4 mg total) by mouth daily. (Patient not taking: Reported on 01/04/2022)    No facility-administered encounter medications on file as of 01/04/2022.    Patient Active Problem List   Diagnosis Date Noted   Foot pain, bilateral 12/15/2021   Urge incontinence of urine 12/15/2021   Midepigastric pain 11/01/2021   Depression with anxiety 11/01/2021   Primary hypertension 11/01/2021   Elevated troponin 09/28/2021   BPH (benign prostatic hyperplasia) 09/28/2021   Chronic diastolic CHF (congestive heart failure) ([place]) 09/28/2021   CIDP (chronic inflammatory demyelinating polyneuropathy) ([place]) 01/12/2021   Diabetes mellitus ([place]) 01/03/2021   Uncontrolled type 2 diabetes mellitus with hyperglycemia, with long-term current use of insulin ([place]) 01/03/2021   Lower extremity edema 11/28/2020   Chronic low back pain 11/28/2020   Psoriatic arthritis ([place]) 11/28/2020   Thoracic aortic aneurysm without rupture ([place]) 11/28/2020   Bilateral leg weakness 11/05/2020   Acute left-sided low back pain with right-sided  sciatica 07/19/2020   Hip pain, acute, left 07/05/2020   Tremor 07/05/2020   Weakness 07/05/2020   Balance problem 03/11/2020   Tinea cruris 03/11/2020   Cellulitis of left groin 03/11/2020   Acute pain of right shoulder 03/11/2020   Interstitial lung disease ([place]) 05/02/2017   Pansinusitis 09/26/2016   Diabetic polyneuropathy associated with diabetes mellitus due to underlying condition ([place]) 05/08/2016   Methamphetamine use disorder, severe, dependence ([place]) 02/11/2016   Substance induced mood disorder ([place]) 02/11/2016   Exertional dyspnea 11/30/2015   ADD (attention deficit disorder) 09/29/2015   Binge eating 09/29/2015   Syncope 05/05/2012   Diarrhea 05/05/2012   Panic attacks 05/05/2012   OSA (obstructive sleep apnea) 05/05/2012   Essential hypertension 05/05/2012   Dyslipidemia 05/05/2012   Coronary artery disease involving native coronary artery of native heart 04/01/2012   Acute  left flank pain 04/01/2012   Drug abuse and dependence ([place]) 04/01/2012   Family history of coronary artery disease 03/31/2012   Sleep apnea, on C-pap 03/31/2012   Hyperlipemia 03/30/2012   HTN (hypertension), benign 03/30/2012   Morbid obesity ([place]) 03/30/2012   DM type 2, uncontrolled, with neuropathy ([place]) 03/30/2012   Depression with suicidal ideation 03/30/2012    Conditions to be addressed/monitored:DMII and CDIP/Chronic pain, Fall prevention  Care Plan : RN Case Manager Plan of Care  Updates made by Luretha Rued, RN since 01/04/2022 12:00 AM     Problem: Chronic Disease Management Education and Care Coordination needs (DM, CDIP/chronic pain)-   Priority: High     Long-Range Goal: Development of Plan of care for Chronic Disease   Start Date: 06/29/2021  Expected End Date: 05/02/2022  Priority: High  Note:   Current Barriers:  12/01/21 Mr. Sharol Roussel reports "I feel good today". He reports improvement in pain management adding he is taking less pain medication. He reports IVIG  treatments and physical therapy has made a difference. He reports "left foot drop; right foot is fine, but working on reaction time and balance". "My Goal is to be walking by my birthday, September, with a walker or a cane". He reports he has been approved for medication assistance with IVIG treatments. He reports, he did not check blood sugar today as he has to place a new sensor on and will place sensor after his video visit with Neurologist today. He reports his blood sugars have been ranging 180-190 and states he has been in contact with Dr. [name] regarding blood sugars. No questions or concerns at this time.  01/04/22 Mr. Sharol Roussel is in good spirits today, he reports he continues with IVIG treatments. He reports improvement in pain adding he has decreased Tramadol use from 8 pills/day to 3 pills/day.  He continues with home health physical therapy adding that he can stand and walk with walker, but is not able to walk with walker independently. Has not checked blood sugar today adding he needs to place a new sensor and will place later today. He states blood sugar was 168 about 3 hours after a meal when last checked. He states his blood sugars are usually 120-140's before breakfast. Last office visit with PCP on 12/15/21 A1C 9.8 increased from 8.5 on 09/29/21. He reports he is eating better now. He reports he lost $253 in food stamp assistance recently which is making it harder to eat healthy. Annual Wellness Visit completed 12/18/21 Chronic Disease Management support and education needs related to DMII and CIDP/Chronic pain  Lacks caregiver support  RNCM Clinical Goal(s):  Patient will verbalize understanding of plan for management of DMII and CIDP/Chronic pain attend all scheduled medical appointments: 06/30/21 with primary care provider. work with Education officer, museum to address  related to the management of Level of care concerns related to the management of DMII and CIDP/Chronic pain will demonstrate  ongoing self health care management ability    through collaboration with RN Care manager, provider, and care team.  Patient will verbalize understanding of plan for management of DMII and CIDP/Chronic pain to assist in maintenance and/or improvement of quality of life.  Interventions: 1:1 collaboration with primary care provider regarding development and update of comprehensive plan of care as evidenced by provider attestation and co-signature Inter-disciplinary care team collaboration (see longitudinal plan of care) Evaluation of current treatment plan related to  self management and patient's adherence to plan as established by provider  CIPD/Chronic Pain Interventions: Status: Goal on track:  Yes. Long Term Goal Reviewed medications with patient and discussed importance of taking as prescribed Reviewed scheduled/upcoming provider appointments including IVIG treatment, clinical pharmacist, cardiologist Encouraged to continue to attend provider visits as scheduled Reviewed importance fall prevention  Provided Positive feedback regarding self management of health conditions Discussed plans with patient for ongoing care management follow up and provided patient with direct contact information for care management team  Diabetes Interventions: Status: Goal on track: No. Long Term Goal 11/27/21 A1C 9.8 increased from 8.5 on 09/29/21 Assessed patient's understanding of A1c goal: <7% Reviewed medications with patient and discussed importance of medication adherence Encouraged to continue to check blood sugar as recommended Encouraged to follow up with provider visits as recommended Discussed dietary patterns: encouraged to avoid concentrated sweets, substitute apple and zero sugar greek yogurt, sugar free candies Discussed importance of controlling blood sugars in management of overall health Referral to Care Guide for food resources Lab Results  Component Value Date   HGBA1C 9.8 (H) 12/15/2021   Patient Goals/Self-Care Activities: Eat healthy meals: eat plenty of vegetables, fruits, lean meats; monitor your fat intake. Avoid saturated fats and transfats: limit sugars, rice, potatoes, pasta. Try substituting sugar free greek yogurt and apple slices in the place of chips/dip/ice cream. Take all medications as prescribed Attend all scheduled provider appointments Call provider office for new concerns or questions  Continue to maintain safety/fall precautions Continue to perform exercises recommended by your home health physical therapist Contact RNCM if you have any questions or concerns or community resource needs   Plan:Telephone follow up appointment with care management team member scheduled for:  02/08/22 The patient has been provided with contact information for the care management team and has been advised to call with any health related questions or concerns.   Thea Silversmith, RN, MSN, BSN, CCM Care Management Coordinator [facility] [phone]

## 2022-01-08 DIAGNOSIS — F410 Panic disorder [episodic paroxysmal anxiety] without agoraphobia: Secondary | ICD-10-CM | POA: Diagnosis not present

## 2022-01-08 DIAGNOSIS — E1136 Type 2 diabetes mellitus with diabetic cataract: Secondary | ICD-10-CM | POA: Diagnosis not present

## 2022-01-08 DIAGNOSIS — F988 Other specified behavioral and emotional disorders with onset usually occurring in childhood and adolescence: Secondary | ICD-10-CM | POA: Diagnosis not present

## 2022-01-08 DIAGNOSIS — J849 Interstitial pulmonary disease, unspecified: Secondary | ICD-10-CM | POA: Diagnosis not present

## 2022-01-08 DIAGNOSIS — E7800 Pure hypercholesterolemia, unspecified: Secondary | ICD-10-CM | POA: Diagnosis not present

## 2022-01-08 DIAGNOSIS — N39498 Other specified urinary incontinence: Secondary | ICD-10-CM | POA: Diagnosis not present

## 2022-01-08 DIAGNOSIS — Z87891 Personal history of nicotine dependence: Secondary | ICD-10-CM | POA: Diagnosis not present

## 2022-01-08 DIAGNOSIS — M1990 Unspecified osteoarthritis, unspecified site: Secondary | ICD-10-CM | POA: Diagnosis not present

## 2022-01-08 DIAGNOSIS — G6181 Chronic inflammatory demyelinating polyneuritis: Secondary | ICD-10-CM | POA: Diagnosis not present

## 2022-01-08 DIAGNOSIS — Z6841 Body Mass Index (BMI) 40.0 and over, adult: Secondary | ICD-10-CM | POA: Diagnosis not present

## 2022-01-08 DIAGNOSIS — E1142 Type 2 diabetes mellitus with diabetic polyneuropathy: Secondary | ICD-10-CM | POA: Diagnosis not present

## 2022-01-08 DIAGNOSIS — E1121 Type 2 diabetes mellitus with diabetic nephropathy: Secondary | ICD-10-CM | POA: Diagnosis not present

## 2022-01-08 DIAGNOSIS — I2510 Atherosclerotic heart disease of native coronary artery without angina pectoris: Secondary | ICD-10-CM | POA: Diagnosis not present

## 2022-01-08 DIAGNOSIS — Z794 Long term (current) use of insulin: Secondary | ICD-10-CM | POA: Diagnosis not present

## 2022-01-08 DIAGNOSIS — M5441 Lumbago with sciatica, right side: Secondary | ICD-10-CM | POA: Diagnosis not present

## 2022-01-08 DIAGNOSIS — I10 Essential (primary) hypertension: Secondary | ICD-10-CM | POA: Diagnosis not present

## 2022-01-08 DIAGNOSIS — Z7984 Long term (current) use of oral hypoglycemic drugs: Secondary | ICD-10-CM | POA: Diagnosis not present

## 2022-01-08 DIAGNOSIS — N401 Enlarged prostate with lower urinary tract symptoms: Secondary | ICD-10-CM | POA: Diagnosis not present

## 2022-01-08 DIAGNOSIS — K589 Irritable bowel syndrome without diarrhea: Secondary | ICD-10-CM | POA: Diagnosis not present

## 2022-01-08 DIAGNOSIS — G4733 Obstructive sleep apnea (adult) (pediatric): Secondary | ICD-10-CM | POA: Diagnosis not present

## 2022-01-08 DIAGNOSIS — F32A Depression, unspecified: Secondary | ICD-10-CM | POA: Diagnosis not present

## 2022-01-09 DIAGNOSIS — G6181 Chronic inflammatory demyelinating polyneuritis: Secondary | ICD-10-CM | POA: Diagnosis not present

## 2022-01-10 DIAGNOSIS — G6181 Chronic inflammatory demyelinating polyneuritis: Secondary | ICD-10-CM | POA: Diagnosis not present

## 2022-01-11 ENCOUNTER — Telehealth: Payer: Self-pay | Admitting: *Deleted

## 2022-01-11 DIAGNOSIS — E1142 Type 2 diabetes mellitus with diabetic polyneuropathy: Secondary | ICD-10-CM | POA: Diagnosis not present

## 2022-01-11 DIAGNOSIS — I10 Essential (primary) hypertension: Secondary | ICD-10-CM | POA: Diagnosis not present

## 2022-01-11 DIAGNOSIS — E7800 Pure hypercholesterolemia, unspecified: Secondary | ICD-10-CM | POA: Diagnosis not present

## 2022-01-11 DIAGNOSIS — G4733 Obstructive sleep apnea (adult) (pediatric): Secondary | ICD-10-CM | POA: Diagnosis not present

## 2022-01-11 DIAGNOSIS — N39498 Other specified urinary incontinence: Secondary | ICD-10-CM | POA: Diagnosis not present

## 2022-01-11 DIAGNOSIS — M1990 Unspecified osteoarthritis, unspecified site: Secondary | ICD-10-CM | POA: Diagnosis not present

## 2022-01-11 DIAGNOSIS — E1136 Type 2 diabetes mellitus with diabetic cataract: Secondary | ICD-10-CM | POA: Diagnosis not present

## 2022-01-11 DIAGNOSIS — Z7984 Long term (current) use of oral hypoglycemic drugs: Secondary | ICD-10-CM | POA: Diagnosis not present

## 2022-01-11 DIAGNOSIS — F410 Panic disorder [episodic paroxysmal anxiety] without agoraphobia: Secondary | ICD-10-CM | POA: Diagnosis not present

## 2022-01-11 DIAGNOSIS — F32A Depression, unspecified: Secondary | ICD-10-CM | POA: Diagnosis not present

## 2022-01-11 DIAGNOSIS — J849 Interstitial pulmonary disease, unspecified: Secondary | ICD-10-CM | POA: Diagnosis not present

## 2022-01-11 DIAGNOSIS — K589 Irritable bowel syndrome without diarrhea: Secondary | ICD-10-CM | POA: Diagnosis not present

## 2022-01-11 DIAGNOSIS — E1121 Type 2 diabetes mellitus with diabetic nephropathy: Secondary | ICD-10-CM | POA: Diagnosis not present

## 2022-01-11 DIAGNOSIS — I2510 Atherosclerotic heart disease of native coronary artery without angina pectoris: Secondary | ICD-10-CM | POA: Diagnosis not present

## 2022-01-11 DIAGNOSIS — Z87891 Personal history of nicotine dependence: Secondary | ICD-10-CM | POA: Diagnosis not present

## 2022-01-11 DIAGNOSIS — N401 Enlarged prostate with lower urinary tract symptoms: Secondary | ICD-10-CM | POA: Diagnosis not present

## 2022-01-11 DIAGNOSIS — F988 Other specified behavioral and emotional disorders with onset usually occurring in childhood and adolescence: Secondary | ICD-10-CM | POA: Diagnosis not present

## 2022-01-11 DIAGNOSIS — Z6841 Body Mass Index (BMI) 40.0 and over, adult: Secondary | ICD-10-CM | POA: Diagnosis not present

## 2022-01-11 DIAGNOSIS — M5441 Lumbago with sciatica, right side: Secondary | ICD-10-CM | POA: Diagnosis not present

## 2022-01-11 DIAGNOSIS — G6181 Chronic inflammatory demyelinating polyneuritis: Secondary | ICD-10-CM | POA: Diagnosis not present

## 2022-01-11 DIAGNOSIS — Z794 Long term (current) use of insulin: Secondary | ICD-10-CM | POA: Diagnosis not present

## 2022-01-11 NOTE — Telephone Encounter (Signed)
   Telephone encounter was:  Successful.  01/11/2022 Name: Belmont Valli MRN: [id number] DOB: July 29, 1961  Tyran Huser is a 61 y.o. year old male who is a primary care patient of [name], DO . The community resource team was consulted for assistance with Transportation Needs , Food Insecurity, and housing   Care guide performed the following interventions: Patient provided with information about care guide support team and interviewed to confirm resource needs.  Follow Up Plan:  Care guide will follow up with patient by phone over the next days  [place], Care Management  [phone] [address] , [place] [address] Email : [email or web address]

## 2022-01-11 NOTE — Telephone Encounter (Signed)
   Telephone encounter was:  Unsuccessful.  01/11/2022 Name: Tyler Brewer MRN: [id number] DOB: March 04, 1961  Unsuccessful outbound call made today to assist with:  Food Insecurity  Outreach Attempt:  1st Attempt  A HIPAA compliant voice message was left requesting a return call.  Instructed patient to call back at   Instructed patient to call back at [phone]  at their earliest convenience. .  [place], Care Management  [phone] [address] , [place] [address] Email : [email or web address]

## 2022-01-16 ENCOUNTER — Encounter: Payer: Self-pay | Admitting: Internal Medicine

## 2022-01-16 DIAGNOSIS — N39498 Other specified urinary incontinence: Secondary | ICD-10-CM | POA: Diagnosis not present

## 2022-01-16 DIAGNOSIS — Z794 Long term (current) use of insulin: Secondary | ICD-10-CM | POA: Diagnosis not present

## 2022-01-16 DIAGNOSIS — I10 Essential (primary) hypertension: Secondary | ICD-10-CM | POA: Diagnosis not present

## 2022-01-16 DIAGNOSIS — E7800 Pure hypercholesterolemia, unspecified: Secondary | ICD-10-CM | POA: Diagnosis not present

## 2022-01-16 DIAGNOSIS — Z87891 Personal history of nicotine dependence: Secondary | ICD-10-CM | POA: Diagnosis not present

## 2022-01-16 DIAGNOSIS — J849 Interstitial pulmonary disease, unspecified: Secondary | ICD-10-CM | POA: Diagnosis not present

## 2022-01-16 DIAGNOSIS — Z6841 Body Mass Index (BMI) 40.0 and over, adult: Secondary | ICD-10-CM | POA: Diagnosis not present

## 2022-01-16 DIAGNOSIS — M1990 Unspecified osteoarthritis, unspecified site: Secondary | ICD-10-CM | POA: Diagnosis not present

## 2022-01-16 DIAGNOSIS — F988 Other specified behavioral and emotional disorders with onset usually occurring in childhood and adolescence: Secondary | ICD-10-CM | POA: Diagnosis not present

## 2022-01-16 DIAGNOSIS — E1121 Type 2 diabetes mellitus with diabetic nephropathy: Secondary | ICD-10-CM | POA: Diagnosis not present

## 2022-01-16 DIAGNOSIS — F32A Depression, unspecified: Secondary | ICD-10-CM | POA: Diagnosis not present

## 2022-01-16 DIAGNOSIS — K589 Irritable bowel syndrome without diarrhea: Secondary | ICD-10-CM | POA: Diagnosis not present

## 2022-01-16 DIAGNOSIS — I2510 Atherosclerotic heart disease of native coronary artery without angina pectoris: Secondary | ICD-10-CM | POA: Diagnosis not present

## 2022-01-16 DIAGNOSIS — G4733 Obstructive sleep apnea (adult) (pediatric): Secondary | ICD-10-CM | POA: Diagnosis not present

## 2022-01-16 DIAGNOSIS — Z7984 Long term (current) use of oral hypoglycemic drugs: Secondary | ICD-10-CM | POA: Diagnosis not present

## 2022-01-16 DIAGNOSIS — E1136 Type 2 diabetes mellitus with diabetic cataract: Secondary | ICD-10-CM | POA: Diagnosis not present

## 2022-01-16 DIAGNOSIS — N401 Enlarged prostate with lower urinary tract symptoms: Secondary | ICD-10-CM | POA: Diagnosis not present

## 2022-01-16 DIAGNOSIS — E1142 Type 2 diabetes mellitus with diabetic polyneuropathy: Secondary | ICD-10-CM | POA: Diagnosis not present

## 2022-01-16 DIAGNOSIS — F410 Panic disorder [episodic paroxysmal anxiety] without agoraphobia: Secondary | ICD-10-CM | POA: Diagnosis not present

## 2022-01-16 DIAGNOSIS — M5441 Lumbago with sciatica, right side: Secondary | ICD-10-CM | POA: Diagnosis not present

## 2022-01-16 DIAGNOSIS — G6181 Chronic inflammatory demyelinating polyneuritis: Secondary | ICD-10-CM | POA: Diagnosis not present

## 2022-01-17 DIAGNOSIS — E1142 Type 2 diabetes mellitus with diabetic polyneuropathy: Secondary | ICD-10-CM

## 2022-01-17 DIAGNOSIS — Z794 Long term (current) use of insulin: Secondary | ICD-10-CM

## 2022-01-17 DIAGNOSIS — G6181 Chronic inflammatory demyelinating polyneuritis: Secondary | ICD-10-CM

## 2022-01-18 ENCOUNTER — Telehealth: Payer: Self-pay | Admitting: *Deleted

## 2022-01-18 ENCOUNTER — Telehealth: Payer: Medicare HMO

## 2022-01-18 ENCOUNTER — Telehealth: Payer: Self-pay | Admitting: Pharmacist

## 2022-01-18 DIAGNOSIS — F32A Depression, unspecified: Secondary | ICD-10-CM | POA: Diagnosis not present

## 2022-01-18 DIAGNOSIS — M5441 Lumbago with sciatica, right side: Secondary | ICD-10-CM | POA: Diagnosis not present

## 2022-01-18 DIAGNOSIS — E1121 Type 2 diabetes mellitus with diabetic nephropathy: Secondary | ICD-10-CM | POA: Diagnosis not present

## 2022-01-18 DIAGNOSIS — M1990 Unspecified osteoarthritis, unspecified site: Secondary | ICD-10-CM | POA: Diagnosis not present

## 2022-01-18 DIAGNOSIS — G6181 Chronic inflammatory demyelinating polyneuritis: Secondary | ICD-10-CM | POA: Diagnosis not present

## 2022-01-18 DIAGNOSIS — I10 Essential (primary) hypertension: Secondary | ICD-10-CM | POA: Diagnosis not present

## 2022-01-18 DIAGNOSIS — Z794 Long term (current) use of insulin: Secondary | ICD-10-CM | POA: Diagnosis not present

## 2022-01-18 DIAGNOSIS — E7800 Pure hypercholesterolemia, unspecified: Secondary | ICD-10-CM | POA: Diagnosis not present

## 2022-01-18 DIAGNOSIS — Z7984 Long term (current) use of oral hypoglycemic drugs: Secondary | ICD-10-CM | POA: Diagnosis not present

## 2022-01-18 DIAGNOSIS — F410 Panic disorder [episodic paroxysmal anxiety] without agoraphobia: Secondary | ICD-10-CM | POA: Diagnosis not present

## 2022-01-18 DIAGNOSIS — F988 Other specified behavioral and emotional disorders with onset usually occurring in childhood and adolescence: Secondary | ICD-10-CM | POA: Diagnosis not present

## 2022-01-18 DIAGNOSIS — Z6841 Body Mass Index (BMI) 40.0 and over, adult: Secondary | ICD-10-CM | POA: Diagnosis not present

## 2022-01-18 DIAGNOSIS — K589 Irritable bowel syndrome without diarrhea: Secondary | ICD-10-CM | POA: Diagnosis not present

## 2022-01-18 DIAGNOSIS — N39498 Other specified urinary incontinence: Secondary | ICD-10-CM | POA: Diagnosis not present

## 2022-01-18 DIAGNOSIS — G4733 Obstructive sleep apnea (adult) (pediatric): Secondary | ICD-10-CM | POA: Diagnosis not present

## 2022-01-18 DIAGNOSIS — I2510 Atherosclerotic heart disease of native coronary artery without angina pectoris: Secondary | ICD-10-CM | POA: Diagnosis not present

## 2022-01-18 DIAGNOSIS — E1136 Type 2 diabetes mellitus with diabetic cataract: Secondary | ICD-10-CM | POA: Diagnosis not present

## 2022-01-18 DIAGNOSIS — J849 Interstitial pulmonary disease, unspecified: Secondary | ICD-10-CM | POA: Diagnosis not present

## 2022-01-18 DIAGNOSIS — N401 Enlarged prostate with lower urinary tract symptoms: Secondary | ICD-10-CM | POA: Diagnosis not present

## 2022-01-18 DIAGNOSIS — Z87891 Personal history of nicotine dependence: Secondary | ICD-10-CM | POA: Diagnosis not present

## 2022-01-18 DIAGNOSIS — E1142 Type 2 diabetes mellitus with diabetic polyneuropathy: Secondary | ICD-10-CM | POA: Diagnosis not present

## 2022-01-18 NOTE — Telephone Encounter (Signed)
   Telephone encounter was:  Successful.  01/18/2022 Name: Braxley Balandran MRN: [id number] DOB: [birth year]  Yazen Rosko is a 61 y.o. year old male who is a primary care patient of [name], DO . The community resource team was consulted for assistance with [place] guide performed the following interventions: Follow up call placed to the patient to discuss status of referral  Follow Up Plan:  No further follow up planned at this time. The patient has been provided with needed resources. [place], Care Management  [phone] [address] , [place] [address] Email : [email or web address]

## 2022-01-18 NOTE — Telephone Encounter (Signed)
  Care Management   Follow Up Note   01/18/2022 Name: Tyler Brewer MRN: [id number] DOB: 1961-01-14   Referred by: [name], DO Reason for referral : No chief complaint on file.   An unsuccessful telephone outreach was attempted today. The patient was referred to the case management team for assistance with care management and care coordination.  Left message on patient's VM regarding missed follow up visit and also to remind him about refilling pravastatin, tamsulosin, losartan and metformin. At last visit he was considering transferring from mail order to local pharmacy.  Follow Up Plan: The care management team will reach out to the patient again over the next 30 days.   Cherre Robins, PharmD Clinical Pharmacist [facility]

## 2022-01-19 ENCOUNTER — Telehealth: Payer: Self-pay | Admitting: Family Medicine

## 2022-01-19 NOTE — Telephone Encounter (Signed)
[facility]  Tele: [phone] verbal orders [email or web address]   Pt was discharged for home health PT on 6/1. Currently maximum potential in home setting and would benefit from outpatient PT.

## 2022-01-19 NOTE — Telephone Encounter (Signed)
Attempted to call this number 3 times and the call would not go through.

## 2022-01-23 DIAGNOSIS — G629 Polyneuropathy, unspecified: Secondary | ICD-10-CM | POA: Diagnosis not present

## 2022-01-23 DIAGNOSIS — G6181 Chronic inflammatory demyelinating polyneuritis: Secondary | ICD-10-CM | POA: Diagnosis not present

## 2022-01-23 DIAGNOSIS — R29898 Other symptoms and signs involving the musculoskeletal system: Secondary | ICD-10-CM | POA: Diagnosis not present

## 2022-01-24 NOTE — Chronic Care Management (AMB) (Signed)
  Chronic Care Management Note  01/24/2022 Name: Tyler Brewer MRN: [id number] DOB: Jan 11, 1961  Tyler Brewer is a 61 y.o. year old male who is a primary care patient of [name], DO and is actively engaged with the care management team. I reached out to Thomasenia Bottoms by phone today to assist with re-scheduling a follow up visit with the Pharmacist  Follow up plan: Telephone appointment with care management team member scheduled for: 01/30/2022  Julian Hy, [place], [place] Management  Direct Dial: [phone]

## 2022-01-26 DIAGNOSIS — R570 Cardiogenic shock: Secondary | ICD-10-CM | POA: Diagnosis not present

## 2022-01-26 DIAGNOSIS — G9340 Encephalopathy, unspecified: Secondary | ICD-10-CM | POA: Diagnosis not present

## 2022-01-26 DIAGNOSIS — E871 Hypo-osmolality and hyponatremia: Secondary | ICD-10-CM | POA: Diagnosis not present

## 2022-01-26 DIAGNOSIS — Z794 Long term (current) use of insulin: Secondary | ICD-10-CM | POA: Diagnosis not present

## 2022-01-26 DIAGNOSIS — I440 Atrioventricular block, first degree: Secondary | ICD-10-CM | POA: Diagnosis not present

## 2022-01-26 DIAGNOSIS — G935 Compression of brain: Secondary | ICD-10-CM | POA: Diagnosis not present

## 2022-01-26 DIAGNOSIS — A419 Sepsis, unspecified organism: Secondary | ICD-10-CM | POA: Diagnosis not present

## 2022-01-26 DIAGNOSIS — J9601 Acute respiratory failure with hypoxia: Secondary | ICD-10-CM | POA: Diagnosis not present

## 2022-01-26 DIAGNOSIS — Z515 Encounter for palliative care: Secondary | ICD-10-CM | POA: Diagnosis not present

## 2022-01-26 DIAGNOSIS — S2243XA Multiple fractures of ribs, bilateral, initial encounter for closed fracture: Secondary | ICD-10-CM | POA: Diagnosis not present

## 2022-01-26 DIAGNOSIS — G9389 Other specified disorders of brain: Secondary | ICD-10-CM | POA: Diagnosis not present

## 2022-01-26 DIAGNOSIS — J181 Lobar pneumonia, unspecified organism: Secondary | ICD-10-CM | POA: Diagnosis not present

## 2022-01-26 DIAGNOSIS — E119 Type 2 diabetes mellitus without complications: Secondary | ICD-10-CM | POA: Diagnosis not present

## 2022-01-26 DIAGNOSIS — J449 Chronic obstructive pulmonary disease, unspecified: Secondary | ICD-10-CM | POA: Diagnosis not present

## 2022-01-26 DIAGNOSIS — R0902 Hypoxemia: Secondary | ICD-10-CM | POA: Diagnosis not present

## 2022-01-26 DIAGNOSIS — I509 Heart failure, unspecified: Secondary | ICD-10-CM | POA: Diagnosis not present

## 2022-01-26 DIAGNOSIS — R579 Shock, unspecified: Secondary | ICD-10-CM | POA: Diagnosis not present

## 2022-01-26 DIAGNOSIS — Z66 Do not resuscitate: Secondary | ICD-10-CM | POA: Diagnosis not present

## 2022-01-26 DIAGNOSIS — Z20822 Contact with and (suspected) exposure to covid-19: Secondary | ICD-10-CM | POA: Diagnosis not present

## 2022-01-26 DIAGNOSIS — E872 Acidosis, unspecified: Secondary | ICD-10-CM | POA: Diagnosis not present

## 2022-01-26 DIAGNOSIS — I469 Cardiac arrest, cause unspecified: Secondary | ICD-10-CM | POA: Diagnosis not present

## 2022-01-26 DIAGNOSIS — S2249XA Multiple fractures of ribs, unspecified side, initial encounter for closed fracture: Secondary | ICD-10-CM | POA: Diagnosis not present

## 2022-01-26 DIAGNOSIS — G9382 Brain death: Secondary | ICD-10-CM | POA: Diagnosis not present

## 2022-01-26 DIAGNOSIS — J9602 Acute respiratory failure with hypercapnia: Secondary | ICD-10-CM | POA: Diagnosis not present

## 2022-01-26 DIAGNOSIS — R404 Transient alteration of awareness: Secondary | ICD-10-CM | POA: Diagnosis not present

## 2022-01-26 DIAGNOSIS — M96A3 Multiple fractures of ribs associated with chest compression and cardiopulmonary resuscitation: Secondary | ICD-10-CM | POA: Diagnosis not present

## 2022-01-26 DIAGNOSIS — G936 Cerebral edema: Secondary | ICD-10-CM | POA: Diagnosis not present

## 2022-01-26 DIAGNOSIS — I21A1 Myocardial infarction type 2: Secondary | ICD-10-CM | POA: Diagnosis not present

## 2022-01-26 DIAGNOSIS — I499 Cardiac arrhythmia, unspecified: Secondary | ICD-10-CM | POA: Diagnosis not present

## 2022-01-26 DIAGNOSIS — M9689 Other intraoperative and postprocedural complications and disorders of the musculoskeletal system: Secondary | ICD-10-CM | POA: Diagnosis not present

## 2022-01-26 DIAGNOSIS — S2242XA Multiple fractures of ribs, left side, initial encounter for closed fracture: Secondary | ICD-10-CM | POA: Diagnosis not present

## 2022-01-26 DIAGNOSIS — R6521 Severe sepsis with septic shock: Secondary | ICD-10-CM | POA: Diagnosis not present

## 2022-01-26 DIAGNOSIS — Z529 Donor of unspecified organ or tissue: Secondary | ICD-10-CM | POA: Diagnosis not present

## 2022-01-26 DIAGNOSIS — J690 Pneumonitis due to inhalation of food and vomit: Secondary | ICD-10-CM | POA: Diagnosis not present

## 2022-01-26 DIAGNOSIS — J811 Chronic pulmonary edema: Secondary | ICD-10-CM | POA: Diagnosis not present

## 2022-01-26 DIAGNOSIS — N179 Acute kidney failure, unspecified: Secondary | ICD-10-CM | POA: Diagnosis not present

## 2022-01-26 DIAGNOSIS — Y848 Other medical procedures as the cause of abnormal reaction of the patient, or of later complication, without mention of misadventure at the time of the procedure: Secondary | ICD-10-CM | POA: Diagnosis not present

## 2022-01-26 DIAGNOSIS — J189 Pneumonia, unspecified organism: Secondary | ICD-10-CM | POA: Diagnosis not present

## 2022-01-26 DIAGNOSIS — N200 Calculus of kidney: Secondary | ICD-10-CM | POA: Diagnosis not present

## 2022-01-26 DIAGNOSIS — I214 Non-ST elevation (NSTEMI) myocardial infarction: Secondary | ICD-10-CM | POA: Diagnosis not present

## 2022-01-26 DIAGNOSIS — T50904A Poisoning by unspecified drugs, medicaments and biological substances, undetermined, initial encounter: Secondary | ICD-10-CM | POA: Diagnosis not present

## 2022-01-26 DIAGNOSIS — R7401 Elevation of levels of liver transaminase levels: Secondary | ICD-10-CM | POA: Diagnosis not present

## 2022-01-26 DIAGNOSIS — I10 Essential (primary) hypertension: Secondary | ICD-10-CM | POA: Diagnosis not present

## 2022-01-26 DIAGNOSIS — Z4682 Encounter for fitting and adjustment of non-vascular catheter: Secondary | ICD-10-CM | POA: Diagnosis not present

## 2022-01-26 DIAGNOSIS — R0989 Other specified symptoms and signs involving the circulatory and respiratory systems: Secondary | ICD-10-CM | POA: Diagnosis not present

## 2022-01-26 DIAGNOSIS — E7800 Pure hypercholesterolemia, unspecified: Secondary | ICD-10-CM | POA: Diagnosis not present

## 2022-01-27 DIAGNOSIS — R7401 Elevation of levels of liver transaminase levels: Secondary | ICD-10-CM | POA: Diagnosis not present

## 2022-01-27 DIAGNOSIS — E872 Acidosis, unspecified: Secondary | ICD-10-CM | POA: Diagnosis not present

## 2022-01-27 DIAGNOSIS — I214 Non-ST elevation (NSTEMI) myocardial infarction: Secondary | ICD-10-CM | POA: Diagnosis not present

## 2022-01-27 DIAGNOSIS — S2249XA Multiple fractures of ribs, unspecified side, initial encounter for closed fracture: Secondary | ICD-10-CM | POA: Diagnosis not present

## 2022-01-27 DIAGNOSIS — E871 Hypo-osmolality and hyponatremia: Secondary | ICD-10-CM | POA: Diagnosis not present

## 2022-01-27 DIAGNOSIS — J189 Pneumonia, unspecified organism: Secondary | ICD-10-CM | POA: Diagnosis not present

## 2022-01-27 DIAGNOSIS — G9340 Encephalopathy, unspecified: Secondary | ICD-10-CM | POA: Diagnosis not present

## 2022-01-27 DIAGNOSIS — R579 Shock, unspecified: Secondary | ICD-10-CM | POA: Diagnosis not present

## 2022-01-28 DIAGNOSIS — G935 Compression of brain: Secondary | ICD-10-CM | POA: Diagnosis not present

## 2022-01-28 DIAGNOSIS — I214 Non-ST elevation (NSTEMI) myocardial infarction: Secondary | ICD-10-CM | POA: Diagnosis not present

## 2022-01-28 DIAGNOSIS — S2249XA Multiple fractures of ribs, unspecified side, initial encounter for closed fracture: Secondary | ICD-10-CM | POA: Diagnosis not present

## 2022-01-28 DIAGNOSIS — E871 Hypo-osmolality and hyponatremia: Secondary | ICD-10-CM | POA: Diagnosis not present

## 2022-01-28 DIAGNOSIS — N200 Calculus of kidney: Secondary | ICD-10-CM | POA: Diagnosis not present

## 2022-01-28 DIAGNOSIS — J189 Pneumonia, unspecified organism: Secondary | ICD-10-CM | POA: Diagnosis not present

## 2022-01-28 DIAGNOSIS — J449 Chronic obstructive pulmonary disease, unspecified: Secondary | ICD-10-CM | POA: Diagnosis not present

## 2022-01-28 DIAGNOSIS — E872 Acidosis, unspecified: Secondary | ICD-10-CM | POA: Diagnosis not present

## 2022-01-28 DIAGNOSIS — I469 Cardiac arrest, cause unspecified: Secondary | ICD-10-CM | POA: Diagnosis not present

## 2022-01-28 DIAGNOSIS — Z794 Long term (current) use of insulin: Secondary | ICD-10-CM | POA: Diagnosis not present

## 2022-01-28 DIAGNOSIS — E119 Type 2 diabetes mellitus without complications: Secondary | ICD-10-CM | POA: Diagnosis not present

## 2022-01-28 DIAGNOSIS — R579 Shock, unspecified: Secondary | ICD-10-CM | POA: Diagnosis not present

## 2022-01-28 DIAGNOSIS — Z529 Donor of unspecified organ or tissue: Secondary | ICD-10-CM | POA: Diagnosis not present

## 2022-01-28 DIAGNOSIS — G936 Cerebral edema: Secondary | ICD-10-CM | POA: Diagnosis not present

## 2022-01-28 DIAGNOSIS — S2243XA Multiple fractures of ribs, bilateral, initial encounter for closed fracture: Secondary | ICD-10-CM | POA: Diagnosis not present

## 2022-01-28 DIAGNOSIS — G9340 Encephalopathy, unspecified: Secondary | ICD-10-CM | POA: Diagnosis not present

## 2022-01-28 DIAGNOSIS — R7401 Elevation of levels of liver transaminase levels: Secondary | ICD-10-CM | POA: Diagnosis not present

## 2022-01-29 DIAGNOSIS — Z526 Liver donor: Secondary | ICD-10-CM | POA: Diagnosis not present

## 2022-01-29 DIAGNOSIS — J189 Pneumonia, unspecified organism: Secondary | ICD-10-CM | POA: Diagnosis not present

## 2022-01-29 DIAGNOSIS — R001 Bradycardia, unspecified: Secondary | ICD-10-CM | POA: Diagnosis not present

## 2022-01-29 DIAGNOSIS — G9340 Encephalopathy, unspecified: Secondary | ICD-10-CM | POA: Diagnosis not present

## 2022-01-29 DIAGNOSIS — E871 Hypo-osmolality and hyponatremia: Secondary | ICD-10-CM | POA: Diagnosis not present

## 2022-01-29 DIAGNOSIS — R7401 Elevation of levels of liver transaminase levels: Secondary | ICD-10-CM | POA: Diagnosis not present

## 2022-01-29 DIAGNOSIS — I214 Non-ST elevation (NSTEMI) myocardial infarction: Secondary | ICD-10-CM | POA: Diagnosis not present

## 2022-01-29 DIAGNOSIS — T50901A Poisoning by unspecified drugs, medicaments and biological substances, accidental (unintentional), initial encounter: Secondary | ICD-10-CM | POA: Diagnosis not present

## 2022-01-29 DIAGNOSIS — I469 Cardiac arrest, cause unspecified: Secondary | ICD-10-CM | POA: Diagnosis not present

## 2022-01-29 DIAGNOSIS — J9811 Atelectasis: Secondary | ICD-10-CM | POA: Diagnosis not present

## 2022-01-29 DIAGNOSIS — I440 Atrioventricular block, first degree: Secondary | ICD-10-CM | POA: Diagnosis not present

## 2022-01-29 DIAGNOSIS — Z005 Encounter for examination of potential donor of organ and tissue: Secondary | ICD-10-CM | POA: Diagnosis not present

## 2022-01-29 DIAGNOSIS — S2249XA Multiple fractures of ribs, unspecified side, initial encounter for closed fracture: Secondary | ICD-10-CM | POA: Diagnosis not present

## 2022-01-29 DIAGNOSIS — R918 Other nonspecific abnormal finding of lung field: Secondary | ICD-10-CM | POA: Diagnosis not present

## 2022-01-29 DIAGNOSIS — R579 Shock, unspecified: Secondary | ICD-10-CM | POA: Diagnosis not present

## 2022-01-29 DIAGNOSIS — E872 Acidosis, unspecified: Secondary | ICD-10-CM | POA: Diagnosis not present

## 2022-01-30 ENCOUNTER — Telehealth: Payer: Medicare HMO

## 2022-01-31 DIAGNOSIS — Z524 Kidney donor: Secondary | ICD-10-CM | POA: Diagnosis not present

## 2022-02-08 ENCOUNTER — Telehealth: Payer: Medicare HMO

## 2022-02-14 ENCOUNTER — Ambulatory Visit: Payer: Self-pay

## 2022-02-14 NOTE — Chronic Care Management (AMB) (Signed)
  Care Management   Follow Up Note   02/14/2022 Name: Tyler Brewer MRN: [id number] DOB: June 25, 1961   Referred by: No primary care provider on file. Reason for referral : No chief complaint on file.    Upon entering EHR, noted that patient is deceased as of 02/12/22. The Primary care provider has been notified. Case Closed.   Thea Silversmith, RN, MSN, BSN, CCM Care Management Coordinator [facility] [phone]

## 2022-02-17 DEATH — deceased

## 2022-03-14 ENCOUNTER — Ambulatory Visit: Payer: Medicare HMO | Admitting: Cardiology

## 2022-07-30 ENCOUNTER — Other Ambulatory Visit: Payer: Self-pay | Admitting: Family Medicine
